# Patient Record
Sex: Female | Born: 1990 | Race: Black or African American | Hispanic: No | Marital: Married | Smoking: Never smoker
Health system: Southern US, Community
[De-identification: ages and names within clinical notes are randomized; demographics above are authoritative.]

## PROBLEM LIST (undated history)

## (undated) DIAGNOSIS — F32A Depression, unspecified: Secondary | ICD-10-CM

## (undated) DIAGNOSIS — E2740 Unspecified adrenocortical insufficiency: Secondary | ICD-10-CM

## (undated) DIAGNOSIS — G510 Bell's palsy: Secondary | ICD-10-CM

## (undated) DIAGNOSIS — J189 Pneumonia, unspecified organism: Secondary | ICD-10-CM

## (undated) DIAGNOSIS — I10 Essential (primary) hypertension: Secondary | ICD-10-CM

## (undated) DIAGNOSIS — R238 Other skin changes: Secondary | ICD-10-CM

## (undated) DIAGNOSIS — A154 Tuberculosis of intrathoracic lymph nodes: Secondary | ICD-10-CM

## (undated) DIAGNOSIS — N189 Chronic kidney disease, unspecified: Secondary | ICD-10-CM

## (undated) DIAGNOSIS — R569 Unspecified convulsions: Secondary | ICD-10-CM

## (undated) DIAGNOSIS — R102 Pelvic and perineal pain: Secondary | ICD-10-CM

## (undated) DIAGNOSIS — K210 Gastro-esophageal reflux disease with esophagitis: Secondary | ICD-10-CM

## (undated) DIAGNOSIS — B20 Human immunodeficiency virus [HIV] disease: Secondary | ICD-10-CM

## (undated) DIAGNOSIS — R42 Dizziness and giddiness: Secondary | ICD-10-CM

## (undated) DIAGNOSIS — A159 Respiratory tuberculosis unspecified: Secondary | ICD-10-CM

## (undated) DIAGNOSIS — R1010 Upper abdominal pain, unspecified: Secondary | ICD-10-CM

## (undated) DIAGNOSIS — R45851 Suicidal ideations: Secondary | ICD-10-CM

## (undated) DIAGNOSIS — D638 Anemia in other chronic diseases classified elsewhere: Secondary | ICD-10-CM

## (undated) DIAGNOSIS — F329 Major depressive disorder, single episode, unspecified: Secondary | ICD-10-CM

## (undated) DIAGNOSIS — R519 Headache, unspecified: Secondary | ICD-10-CM

## (undated) DIAGNOSIS — M5440 Lumbago with sciatica, unspecified side: Secondary | ICD-10-CM

## (undated) DIAGNOSIS — G939 Disorder of brain, unspecified: Secondary | ICD-10-CM

## (undated) DIAGNOSIS — K219 Gastro-esophageal reflux disease without esophagitis: Secondary | ICD-10-CM

## (undated) DIAGNOSIS — S91011A Laceration without foreign body, right ankle, initial encounter: Secondary | ICD-10-CM

## (undated) DIAGNOSIS — M79606 Pain in leg, unspecified: Secondary | ICD-10-CM

## (undated) DIAGNOSIS — R5383 Other fatigue: Secondary | ICD-10-CM

## (undated) DIAGNOSIS — G8929 Other chronic pain: Secondary | ICD-10-CM

## (undated) DIAGNOSIS — M5416 Radiculopathy, lumbar region: Secondary | ICD-10-CM

## (undated) DIAGNOSIS — M549 Dorsalgia, unspecified: Secondary | ICD-10-CM

## (undated) DIAGNOSIS — A872 Lymphocytic choriomeningitis: Secondary | ICD-10-CM

## (undated) DIAGNOSIS — B0089 Other herpesviral infection: Secondary | ICD-10-CM

## (undated) DIAGNOSIS — F322 Major depressive disorder, single episode, severe without psychotic features: Secondary | ICD-10-CM

## (undated) DIAGNOSIS — Z973 Presence of spectacles and contact lenses: Secondary | ICD-10-CM

## (undated) HISTORY — PX: APPENDECTOMY: SHX54

## (undated) HISTORY — DX: Pelvic and perineal pain: R10.2

## (undated) HISTORY — DX: Lumbago with sciatica, unspecified side: M54.40

---
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
## 2012-02-13 DIAGNOSIS — B20 Human immunodeficiency virus [HIV] disease: Secondary | ICD-10-CM

## 2012-02-13 DIAGNOSIS — Z21 Asymptomatic human immunodeficiency virus [HIV] infection status: Secondary | ICD-10-CM

## 2012-02-13 HISTORY — PX: LUNG BIOPSY: SHX232

## 2012-02-13 HISTORY — DX: Asymptomatic human immunodeficiency virus (hiv) infection status: Z21

## 2012-02-13 HISTORY — DX: Human immunodeficiency virus (HIV) disease: B20

## 2012-03-01 ENCOUNTER — Other Ambulatory Visit: Payer: Self-pay | Admitting: Geriatric Medicine

## 2012-03-01 ENCOUNTER — Ambulatory Visit
Admission: RE | Admit: 2012-03-01 | Discharge: 2012-03-01 | Disposition: A | Discharge status: Home / Self Care | Payer: No Typology Code available for payment source | Source: Ambulatory Visit | Attending: Geriatric Medicine | Admitting: Geriatric Medicine

## 2012-03-01 DIAGNOSIS — R509 Fever, unspecified: Secondary | ICD-10-CM

## 2012-03-01 DIAGNOSIS — R0600 Dyspnea, unspecified: Secondary | ICD-10-CM

## 2012-03-10 ENCOUNTER — Inpatient Hospital Stay (HOSPITAL_COMMUNITY)
Admission: EM | Admit: 2012-03-10 | Discharge: 2012-03-25 | DRG: 970 | Disposition: A | Discharge status: Home / Self Care | Payer: Medicaid Other | Source: Ambulatory Visit | Attending: Internal Medicine | Admitting: Internal Medicine

## 2012-03-10 ENCOUNTER — Encounter (HOSPITAL_COMMUNITY): Payer: Self-pay | Admitting: *Deleted

## 2012-03-10 ENCOUNTER — Other Ambulatory Visit: Payer: Self-pay

## 2012-03-10 DIAGNOSIS — Z418 Encounter for other procedures for purposes other than remedying health state: Secondary | ICD-10-CM

## 2012-03-10 DIAGNOSIS — Z2989 Encounter for other specified prophylactic measures: Secondary | ICD-10-CM

## 2012-03-10 DIAGNOSIS — B0089 Other herpesviral infection: Secondary | ICD-10-CM

## 2012-03-10 DIAGNOSIS — Z79899 Other long term (current) drug therapy: Secondary | ICD-10-CM

## 2012-03-10 DIAGNOSIS — D509 Iron deficiency anemia, unspecified: Secondary | ICD-10-CM | POA: Diagnosis present

## 2012-03-10 DIAGNOSIS — R0789 Other chest pain: Secondary | ICD-10-CM | POA: Diagnosis present

## 2012-03-10 DIAGNOSIS — B589 Toxoplasmosis, unspecified: Secondary | ICD-10-CM | POA: Diagnosis present

## 2012-03-10 DIAGNOSIS — D649 Anemia, unspecified: Secondary | ICD-10-CM

## 2012-03-10 DIAGNOSIS — R000 Tachycardia, unspecified: Secondary | ICD-10-CM | POA: Diagnosis not present

## 2012-03-10 DIAGNOSIS — B009 Herpesviral infection, unspecified: Secondary | ICD-10-CM | POA: Diagnosis present

## 2012-03-10 DIAGNOSIS — R079 Chest pain, unspecified: Secondary | ICD-10-CM | POA: Diagnosis present

## 2012-03-10 DIAGNOSIS — R918 Other nonspecific abnormal finding of lung field: Secondary | ICD-10-CM

## 2012-03-10 DIAGNOSIS — B370 Candidal stomatitis: Secondary | ICD-10-CM | POA: Diagnosis present

## 2012-03-10 DIAGNOSIS — J9859 Other diseases of mediastinum, not elsewhere classified: Secondary | ICD-10-CM

## 2012-03-10 DIAGNOSIS — A158 Other respiratory tuberculosis: Secondary | ICD-10-CM | POA: Diagnosis present

## 2012-03-10 DIAGNOSIS — E876 Hypokalemia: Secondary | ICD-10-CM | POA: Diagnosis not present

## 2012-03-10 DIAGNOSIS — K2210 Ulcer of esophagus without bleeding: Secondary | ICD-10-CM

## 2012-03-10 DIAGNOSIS — K2100 Gastro-esophageal reflux disease with esophagitis, without bleeding: Secondary | ICD-10-CM

## 2012-03-10 DIAGNOSIS — B20 Human immunodeficiency virus [HIV] disease: Secondary | ICD-10-CM | POA: Diagnosis present

## 2012-03-10 DIAGNOSIS — K208 Other esophagitis without bleeding: Secondary | ICD-10-CM | POA: Diagnosis present

## 2012-03-10 DIAGNOSIS — D638 Anemia in other chronic diseases classified elsewhere: Secondary | ICD-10-CM | POA: Diagnosis present

## 2012-03-10 DIAGNOSIS — A154 Tuberculosis of intrathoracic lymph nodes: Secondary | ICD-10-CM | POA: Diagnosis present

## 2012-03-10 DIAGNOSIS — D709 Neutropenia, unspecified: Secondary | ICD-10-CM | POA: Diagnosis not present

## 2012-03-10 DIAGNOSIS — R1310 Dysphagia, unspecified: Secondary | ICD-10-CM | POA: Diagnosis present

## 2012-03-10 DIAGNOSIS — D696 Thrombocytopenia, unspecified: Secondary | ICD-10-CM | POA: Diagnosis not present

## 2012-03-10 MED ORDER — ONDANSETRON HCL 4 MG/2ML IJ SOLN
4.0000 mg | Freq: Once | INTRAMUSCULAR | Status: AC
  Administered 2012-03-11: 4 mg via INTRAVENOUS
  Filled 2012-03-10: qty 2

## 2012-03-10 MED ORDER — MORPHINE SULFATE 4 MG/ML IJ SOLN
4.0000 mg | Freq: Once | INTRAMUSCULAR | Status: AC
  Administered 2012-03-11: 4 mg via INTRAVENOUS
  Filled 2012-03-10: qty 1

## 2012-03-10 MED ORDER — SODIUM CHLORIDE 0.9 % IV SOLN
INTRAVENOUS | Status: DC
  Administered 2012-03-11 – 2012-03-15 (×9): via INTRAVENOUS

## 2012-03-10 MED ORDER — PANTOPRAZOLE SODIUM 40 MG IV SOLR
40.0000 mg | Freq: Once | INTRAVENOUS | Status: AC
  Administered 2012-03-11: 40 mg via INTRAVENOUS
  Filled 2012-03-10: qty 40

## 2012-03-10 NOTE — ED Notes (Signed)
Pt reports epigastric pain x3 weeks - pt reports pain w/ swallowing and has been experiencing decreased  Appetite d/t pain. Pt states pain is sharp and radiates to back - admits to nausea, denies vomiting or diarrhea or fever.

## 2012-03-10 NOTE — ED Notes (Addendum)
C/o epigastric chest and abd pain, hurts chest to swallow & take a deep breath, "can't eat", (denies: vd, fever or sob), subjectively hot when pain comes. Some nausea.  "Seen here recently for the same & given abx, not getting any better"

## 2012-03-10 NOTE — ED Notes (Signed)
EKG DONE BY EMT R BROWN 

## 2012-03-11 ENCOUNTER — Emergency Department (HOSPITAL_COMMUNITY): Payer: Medicaid Other

## 2012-03-11 ENCOUNTER — Encounter (HOSPITAL_COMMUNITY)
Admission: EM | Disposition: A | Discharge status: Home / Self Care | Payer: Self-pay | Source: Ambulatory Visit | Attending: Internal Medicine

## 2012-03-11 ENCOUNTER — Encounter (HOSPITAL_COMMUNITY): Payer: Self-pay | Admitting: Radiology

## 2012-03-11 DIAGNOSIS — A154 Tuberculosis of intrathoracic lymph nodes: Secondary | ICD-10-CM

## 2012-03-11 DIAGNOSIS — K2100 Gastro-esophageal reflux disease with esophagitis, without bleeding: Secondary | ICD-10-CM

## 2012-03-11 DIAGNOSIS — K208 Other esophagitis without bleeding: Secondary | ICD-10-CM

## 2012-03-11 DIAGNOSIS — D649 Anemia, unspecified: Secondary | ICD-10-CM

## 2012-03-11 DIAGNOSIS — K2210 Ulcer of esophagus without bleeding: Secondary | ICD-10-CM

## 2012-03-11 DIAGNOSIS — R222 Localized swelling, mass and lump, trunk: Secondary | ICD-10-CM

## 2012-03-11 DIAGNOSIS — R079 Chest pain, unspecified: Secondary | ICD-10-CM | POA: Diagnosis present

## 2012-03-11 DIAGNOSIS — R1310 Dysphagia, unspecified: Secondary | ICD-10-CM | POA: Diagnosis present

## 2012-03-11 DIAGNOSIS — D638 Anemia in other chronic diseases classified elsewhere: Secondary | ICD-10-CM

## 2012-03-11 DIAGNOSIS — B0089 Other herpesviral infection: Secondary | ICD-10-CM

## 2012-03-11 DIAGNOSIS — D509 Iron deficiency anemia, unspecified: Secondary | ICD-10-CM | POA: Diagnosis present

## 2012-03-11 HISTORY — DX: Other esophagitis without bleeding: K20.80

## 2012-03-11 HISTORY — DX: Gastro-esophageal reflux disease with esophagitis, without bleeding: K21.00

## 2012-03-11 HISTORY — DX: Anemia in other chronic diseases classified elsewhere: D63.8

## 2012-03-11 HISTORY — DX: Other herpesviral infection: B00.89

## 2012-03-11 HISTORY — PX: ESOPHAGOGASTRODUODENOSCOPY: SHX5428

## 2012-03-11 HISTORY — DX: Tuberculosis of intrathoracic lymph nodes: A15.4

## 2012-03-11 LAB — COMPREHENSIVE METABOLIC PANEL
ALT: 10 U/L (ref 0–35)
ALT: 11 U/L (ref 0–35)
AST: 12 U/L (ref 0–37)
AST: 15 U/L (ref 0–37)
Albumin: 3 g/dL — ABNORMAL LOW (ref 3.5–5.2)
Albumin: 3.2 g/dL — ABNORMAL LOW (ref 3.5–5.2)
Alkaline Phosphatase: 63 U/L (ref 39–117)
Alkaline Phosphatase: 65 U/L (ref 39–117)
BUN: 4 mg/dL — ABNORMAL LOW (ref 6–23)
BUN: <3 mg/dL — ABNORMAL LOW (ref 6–23)
CO2: 23 mEq/L (ref 19–32)
CO2: 24 mEq/L (ref 19–32)
Calcium: 8.9 mg/dL (ref 8.4–10.5)
Calcium: 9.2 mg/dL (ref 8.4–10.5)
Chloride: 100 mEq/L (ref 96–112)
Chloride: 105 mEq/L (ref 96–112)
Creatinine, Ser: 0.61 mg/dL (ref 0.50–1.10)
Creatinine, Ser: 0.62 mg/dL (ref 0.50–1.10)
GFR calc Af Amer: >90 mL/min (ref >90)
GFR calc Af Amer: >90 mL/min (ref >90)
GFR calc non Af Amer: >90 mL/min (ref >90)
GFR calc non Af Amer: >90 mL/min (ref >90)
Glucose, Bld: 86 mg/dL (ref 70–99)
Glucose, Bld: 87 mg/dL (ref 70–99)
Potassium: 3.6 mEq/L (ref 3.5–5.1)
Potassium: 3.8 mEq/L (ref 3.5–5.1)
Sodium: 133 mEq/L — ABNORMAL LOW (ref 135–145)
Sodium: 138 mEq/L (ref 135–145)
Total Bilirubin: 0.2 mg/dL — ABNORMAL LOW (ref 0.3–1.2)
Total Bilirubin: 0.2 mg/dL — ABNORMAL LOW (ref 0.3–1.2)
Total Protein: 7.4 g/dL (ref 6.0–8.3)
Total Protein: 7.9 g/dL (ref 6.0–8.3)

## 2012-03-11 LAB — URINALYSIS, ROUTINE W REFLEX MICROSCOPIC
Bilirubin Urine: NEGATIVE
Glucose, UA: NEGATIVE mg/dL
Hgb urine dipstick: NEGATIVE
Ketones, ur: NEGATIVE mg/dL
Leukocytes, UA: NEGATIVE
Nitrite: NEGATIVE
Protein, ur: NEGATIVE mg/dL
Specific Gravity, Urine: 1.012 (ref 1.005–1.030)
Urobilinogen, UA: 0.2 mg/dL (ref 0.0–1.0)
pH: 7 (ref 5.0–8.0)

## 2012-03-11 LAB — GLUCOSE, CAPILLARY
Glucose-Capillary: 78 mg/dL (ref 70–99)
Glucose-Capillary: 73 mg/dL (ref 70–99)

## 2012-03-11 LAB — IRON AND TIBC
Iron: 34 ug/dL — ABNORMAL LOW (ref 42–135)
Saturation Ratios: 14 % — ABNORMAL LOW (ref 20–55)
TIBC: 242 ug/dL — ABNORMAL LOW (ref 250–470)
UIBC: 208 ug/dL (ref 125–400)

## 2012-03-11 LAB — CARDIAC PANEL(CRET KIN+CKTOT+MB+TROPI)
CK, MB: 1 ng/mL (ref 0.3–4.0)
Relative Index: INVALID (ref 0.0–2.5)
Total CK: 60 U/L (ref 7–177)
Troponin I: <0.3 ng/mL (ref <0.30)

## 2012-03-11 LAB — CBC
HCT: 30.3 % — ABNORMAL LOW (ref 36.0–46.0)
HCT: 31.1 % — ABNORMAL LOW (ref 36.0–46.0)
Hemoglobin: 10.4 g/dL — ABNORMAL LOW (ref 12.0–15.0)
Hemoglobin: 10.5 g/dL — ABNORMAL LOW (ref 12.0–15.0)
MCH: 30 pg (ref 26.0–34.0)
MCH: 30.3 pg (ref 26.0–34.0)
MCHC: 33.8 g/dL (ref 30.0–36.0)
MCHC: 34.3 g/dL (ref 30.0–36.0)
MCV: 87.3 fL (ref 78.0–100.0)
MCV: 89.6 fL (ref 78.0–100.0)
Platelets: 223 10*3/uL (ref 150–400)
Platelets: 247 10*3/uL (ref 150–400)
RBC: 3.47 MIL/uL — ABNORMAL LOW (ref 3.87–5.11)
RBC: 3.47 MIL/uL — ABNORMAL LOW (ref 3.87–5.11)
RDW: 14.9 % (ref 11.5–15.5)
RDW: 15.3 % (ref 11.5–15.5)
WBC: 2.3 10*3/uL — ABNORMAL LOW (ref 4.0–10.5)
WBC: 3.5 10*3/uL — ABNORMAL LOW (ref 4.0–10.5)

## 2012-03-11 LAB — HIV ANTIBODY (ROUTINE TESTING W REFLEX)
HIV: REACTIVE — AB
HIV: REACTIVE — AB

## 2012-03-11 LAB — PROTIME-INR
INR: 1.12 (ref 0.00–1.49)
Prothrombin Time: 14.6 seconds (ref 11.6–15.2)

## 2012-03-11 LAB — DIFFERENTIAL
Basophils Absolute: 0 10*3/uL (ref 0.0–0.1)
Basophils Relative: 0 % (ref 0–1)
Eosinophils Absolute: 0.1 10*3/uL (ref 0.0–0.7)
Eosinophils Relative: 3 % (ref 0–5)
Lymphocytes Relative: 25 % (ref 12–46)
Lymphs Abs: 0.6 10*3/uL — ABNORMAL LOW (ref 0.7–4.0)
Monocytes Absolute: 0.5 10*3/uL (ref 0.1–1.0)
Monocytes Relative: 20 % — ABNORMAL HIGH (ref 3–12)
Neutro Abs: 1.2 10*3/uL — ABNORMAL LOW (ref 1.7–7.7)
Neutrophils Relative %: 52 % (ref 43–77)

## 2012-03-11 LAB — SEDIMENTATION RATE: Sed Rate: 74 mm/hr — ABNORMAL HIGH (ref 0–22)

## 2012-03-11 LAB — PREGNANCY, URINE: Preg Test, Ur: NEGATIVE

## 2012-03-11 LAB — RETICULOCYTES
RBC.: 3.6 MIL/uL — ABNORMAL LOW (ref 3.87–5.11)
Retic Count, Absolute: 25.2 10*3/uL (ref 19.0–186.0)
Retic Ct Pct: 0.7 % (ref 0.4–3.1)

## 2012-03-11 LAB — LIPASE, BLOOD
Lipase: 20 U/L (ref 11–59)
Lipase: 28 U/L (ref 11–59)

## 2012-03-11 LAB — LACTIC ACID, PLASMA: Lactic Acid, Venous: 0.6 mmol/L (ref 0.5–2.2)

## 2012-03-11 LAB — URIC ACID: Uric Acid, Serum: 2.8 mg/dL (ref 2.4–7.0)

## 2012-03-11 LAB — FOLATE: Folate: 16.2 ng/mL

## 2012-03-11 LAB — FERRITIN: Ferritin: 57 ng/mL (ref 10–291)

## 2012-03-11 LAB — D-DIMER, QUANTITATIVE (NOT AT ARMC): D-Dimer, Quant: 1.62 ug/mL-FEU — ABNORMAL HIGH (ref 0.00–0.48)

## 2012-03-11 LAB — VITAMIN B12: Vitamin B-12: 1885 pg/mL — ABNORMAL HIGH (ref 211–911)

## 2012-03-11 SURGERY — EGD (ESOPHAGOGASTRODUODENOSCOPY)
Anesthesia: Moderate Sedation

## 2012-03-11 MED ORDER — ACETAMINOPHEN 325 MG PO TABS
650.0000 mg | ORAL_TABLET | Freq: Four times a day (QID) | ORAL | Status: DC | PRN
  Administered 2012-03-12 – 2012-03-20 (×3): 650 mg via ORAL
  Filled 2012-03-11 (×3): qty 2

## 2012-03-11 MED ORDER — FENTANYL CITRATE 0.05 MG/ML IJ SOLN
INTRAMUSCULAR | Status: AC
Start: 2012-03-11 — End: 2012-03-11
  Filled 2012-03-11: qty 2

## 2012-03-11 MED ORDER — IOHEXOL 350 MG/ML SOLN
100.0000 mL | Freq: Once | INTRAVENOUS | Status: AC | PRN
  Administered 2012-03-11: 100 mL via INTRAVENOUS

## 2012-03-11 MED ORDER — LIDOCAINE VISCOUS 2 % MT SOLN
20.0000 mL | OROMUCOSAL | Status: DC | PRN
  Filled 2012-03-11: qty 20

## 2012-03-11 MED ORDER — ONDANSETRON HCL 4 MG/2ML IJ SOLN
4.0000 mg | Freq: Four times a day (QID) | INTRAMUSCULAR | Status: DC | PRN
  Filled 2012-03-11: qty 2

## 2012-03-11 MED ORDER — MORPHINE SULFATE 2 MG/ML IJ SOLN
1.0000 mg | INTRAMUSCULAR | Status: DC | PRN
  Administered 2012-03-11 – 2012-03-12 (×3): 1 mg via INTRAVENOUS
  Filled 2012-03-11 (×3): qty 1

## 2012-03-11 MED ORDER — MIDAZOLAM HCL 10 MG/2ML IJ SOLN
INTRAMUSCULAR | Status: AC
  Filled 2012-03-11: qty 2

## 2012-03-11 MED ORDER — MIDAZOLAM HCL 10 MG/2ML IJ SOLN
INTRAMUSCULAR | Status: DC | PRN
  Administered 2012-03-11 (×2): 2 mg via INTRAVENOUS
  Administered 2012-03-11 (×2): 1 mg via INTRAVENOUS

## 2012-03-11 MED ORDER — MAGIC MOUTHWASH W/LIDOCAINE
5.0000 mL | Freq: Four times a day (QID) | ORAL | Status: DC
  Administered 2012-03-11 – 2012-03-16 (×18): 5 mL via ORAL
  Filled 2012-03-11 (×28): qty 5

## 2012-03-11 MED ORDER — ONDANSETRON HCL 4 MG PO TABS: 4.0000 mg | ORAL_TABLET | Freq: Four times a day (QID) | ORAL | Status: DC | PRN

## 2012-03-11 MED ORDER — SODIUM CHLORIDE 0.9 % IJ SOLN
3.0000 mL | Freq: Two times a day (BID) | INTRAMUSCULAR | Status: DC
  Administered 2012-03-12: 09:00:00 via INTRAVENOUS
  Administered 2012-03-12 – 2012-03-14 (×5): 3 mL via INTRAVENOUS
  Administered 2012-03-15: 10 mL via INTRAVENOUS

## 2012-03-11 MED ORDER — BUTAMBEN-TETRACAINE-BENZOCAINE 2-2-14 % EX AERO
INHALATION_SPRAY | CUTANEOUS | Status: DC | PRN
  Administered 2012-03-11: 2 via TOPICAL

## 2012-03-11 MED ORDER — PANTOPRAZOLE SODIUM 40 MG IV SOLR: 40.0000 mg | INTRAVENOUS | Status: DC

## 2012-03-11 MED ORDER — SUCRALFATE 1 GM/10ML PO SUSP
1.0000 g | Freq: Three times a day (TID) | ORAL | Status: DC
  Administered 2012-03-11 – 2012-03-17 (×19): 1 g via ORAL
  Filled 2012-03-11 (×28): qty 10

## 2012-03-11 MED ORDER — PANTOPRAZOLE SODIUM 40 MG IV SOLR
40.0000 mg | Freq: Two times a day (BID) | INTRAVENOUS | Status: DC
  Administered 2012-03-11 – 2012-03-17 (×12): 40 mg via INTRAVENOUS
  Filled 2012-03-11 (×15): qty 40

## 2012-03-11 MED ORDER — ACETAMINOPHEN 650 MG RE SUPP: 650.0000 mg | Freq: Four times a day (QID) | RECTAL | Status: DC | PRN

## 2012-03-11 MED ORDER — ENSURE COMPLETE PO LIQD
237.0000 mL | Freq: Three times a day (TID) | ORAL | Status: DC
  Administered 2012-03-11 – 2012-03-15 (×12): 237 mL via ORAL

## 2012-03-11 MED ORDER — FENTANYL NICU IV SYRINGE 50 MCG/ML
INJECTION | INTRAMUSCULAR | Status: DC | PRN
  Administered 2012-03-11 (×3): 25 ug via INTRAVENOUS

## 2012-03-11 MED ORDER — MORPHINE SULFATE 4 MG/ML IJ SOLN
4.0000 mg | Freq: Once | INTRAMUSCULAR | Status: AC
  Administered 2012-03-11: 4 mg via INTRAVENOUS
  Filled 2012-03-11: qty 1

## 2012-03-11 NOTE — H&P (Signed)
Kelsey Rodriguez is an 21 y.o. female.   PCP - None. Chief Complaint: Chest pain. HPI: 21 year-old female with no significant past history who has been in the [place] for the last year and a half and is originally from [place] has come to the ER because of chest pain. Patient has been experiencing chest pain for the last 3 weeks which has been progressively worsening. The pain is retrosternal increased on eating or drinking and also on deep breathing. She is just so much pain that she has stopped eating and lost weight. Denies any fever chills cough or phlegm nausea or diarrhea abdominal pain. In the ER patient had a CT angiogram of the chest which at this time shows a mass which could be arising from mediastinum versus lung patient has been admitted for further management. Patient had originally gone to urgent care and the physician over there had started her on PPI, empiric antibiotics and also anti-parasite medications even after taking all of this there was no relief.  History reviewed. No pertinent past medical history.  Past Surgical History  Procedure Date  . Appendectomy     History reviewed. No pertinent family history. Social History:  reports that she has never smoked. She does not have any smokeless tobacco history on file. She reports that she does not drink alcohol or use illicit drugs.  Allergies: No Known Allergies  Medications Prior to Admission  Medication Dose Route Frequency Provider Last Rate Last Dose  . 0.9 %  sodium chloride infusion   Intravenous Continuous Sunnie Nielsen, MD 125 mL/hr at 03/11/12 0328    . iohexol (OMNIPAQUE) 350 MG/ML injection 100 mL  100 mL Intravenous Once PRN Medication Radiologist, MD   100 mL at 03/11/12 0142  . morphine 4 MG/ML injection 4 mg  4 mg Intravenous Once Sunnie Nielsen, MD   4 mg at 03/11/12 0030  . morphine 4 MG/ML injection 4 mg  4 mg Intravenous Once Sunnie Nielsen, MD   4 mg at 03/11/12 0313  . ondansetron (ZOFRAN) injection 4 mg  4 mg  Intravenous Once Sunnie Nielsen, MD   4 mg at 03/11/12 0030  . pantoprazole (PROTONIX) injection 40 mg  40 mg Intravenous Once Sunnie Nielsen, MD   40 mg at 03/11/12 0030   No current outpatient prescriptions on file as of 03/10/2012.    Results for orders placed during the hospital encounter of 03/10/12 (from the past 48 hour(s))  CBC     Status: Abnormal   Collection Time   03/11/12 12:05 AM      Component Value Range Comment   WBC 3.5 (*) 4.0 - 10.5 (K/uL)    RBC 3.47 (*) 3.87 - 5.11 (MIL/uL)    Hemoglobin 10.4 (*) 12.0 - 15.0 (g/dL)    HCT 16.1 (*) 09.6 - 46.0 (%)    MCV 87.3  78.0 - 100.0 (fL)    MCH 30.0  26.0 - 34.0 (pg)    MCHC 34.3  30.0 - 36.0 (g/dL)    RDW 04.5  40.9 - 81.1 (%)    Platelets 247  150 - 400 (K/uL)   COMPREHENSIVE METABOLIC PANEL     Status: Abnormal   Collection Time   03/11/12 12:05 AM      Component Value Range Comment   Sodium 133 (*) 135 - 145 (mEq/L)    Potassium 3.8  3.5 - 5.1 (mEq/L)    Chloride 100  96 - 112 (mEq/L)    CO2 23  19 - 32 (mEq/L)    Glucose, Bld 87  70 - 99 (mg/dL)    BUN 4 (*) 6 - 23 (mg/dL)    Creatinine, Ser 1.61  0.50 - 1.10 (mg/dL)    Calcium 9.2  8.4 - 10.5 (mg/dL)    Total Protein 7.9  6.0 - 8.3 (g/dL)    Albumin 3.2 (*) 3.5 - 5.2 (g/dL)    AST 15  0 - 37 (U/L)    ALT 11  0 - 35 (U/L)    Alkaline Phosphatase 63  39 - 117 (U/L)    Total Bilirubin 0.2 (*) 0.3 - 1.2 (mg/dL)    GFR calc non Af Amer >90  >90 (mL/min)    GFR calc Af Amer >90  >90 (mL/min)   LIPASE, BLOOD     Status: Normal   Collection Time   03/11/12 12:05 AM      Component Value Range Comment   Lipase 28  11 - 59 (U/L)   D-DIMER, QUANTITATIVE     Status: Abnormal   Collection Time   03/11/12 12:05 AM      Component Value Range Comment   D-Dimer, Quant 1.62 (*) 0.00 - 0.48 (ug/mL-FEU)   URINALYSIS, ROUTINE W REFLEX MICROSCOPIC     Status: Abnormal   Collection Time   03/11/12 12:07 AM      Component Value Range Comment   Color, Urine YELLOW  YELLOW      APPearance CLOUDY (*) CLEAR     Specific Gravity, Urine 1.012  1.005 - 1.030     pH 7.0  5.0 - 8.0     Glucose, UA NEGATIVE  NEGATIVE (mg/dL)    Hgb urine dipstick NEGATIVE  NEGATIVE     Bilirubin Urine NEGATIVE  NEGATIVE     Ketones, ur NEGATIVE  NEGATIVE (mg/dL)    Protein, ur NEGATIVE  NEGATIVE (mg/dL)    Urobilinogen, UA 0.2  0.0 - 1.0 (mg/dL)    Nitrite NEGATIVE  NEGATIVE     Leukocytes, UA NEGATIVE  NEGATIVE  MICROSCOPIC NOT DONE ON URINES WITH NEGATIVE PROTEIN, BLOOD, LEUKOCYTES, NITRITE, OR GLUCOSE <1000 mg/dL.  PREGNANCY, URINE     Status: Normal   Collection Time   03/11/12 12:07 AM      Component Value Range Comment   Preg Test, Ur NEGATIVE  NEGATIVE     Ct Angio Chest W/cm &/or Wo Cm  03/11/2012  *RADIOLOGY REPORT*  Clinical Data: Chest pain and back pain for 3 weeks; elevated D- dimer.  Nausea.  CT ANGIOGRAPHY CHEST  Technique:  Multidetector CT imaging of the chest using the standard protocol during bolus administration of intravenous contrast. Multiplanar reconstructed images including MIPs were obtained and reviewed to evaluate the vascular anatomy.  Contrast: OMNIPAQUE IOHEXOL 350 MG/ML IV SOLN  Comparison: Chest radiograph performed earlier today at 12:14 a.m.  Findings: There is a focal 3.1 x 2.7 x 2.7 cm mass noted abutting the aortic arch, at the periaortic region, with centrally decreased attenuation.  Whether this arises from the mediastinum or from the left upper lobe is difficult to characterize on this study; surrounding atelectasis is noted, and this displaces adjacent vasculature.  Given the patient's age and its location, this may reflect a thymoma, germ cell tumor or less likely lymphoma, given the lack of significant additional lymphadenopathy.  If this arises from the lung, it may reflect an atypical infection such as tuberculosis, though the lack of leukocytosis or additional lung disease suggests against infection.  Adjacent scattered smaller lymph nodes  remain grossly normal in size.  The visualized thymus is grossly unremarkable in appearance.  The lungs are otherwise essentially clear bilaterally.  No pulmonary nodules are identified.  No pleural effusion or pneumothorax is seen.  Trace pericardial fluid remains borderline normal in appearance. The great vessels are unremarkable in appearance.  No definite mediastinal lymphadenopathy is otherwise seen; visualized mediastinal nodes remain borderline normal in size.  Diffuse haziness within the mediastinum raises question for mild diffuse associated soft tissue edema; underlying infiltration of the mediastinum cannot be excluded, given the patient's esophageal symptoms.  The great vessels are grossly unremarkable in appearance.  The visualized portions of the thyroid gland are unremarkable.  No axillary lymphadenopathy is seen.  The visualized portions of the liver and the spleen are unremarkable in appearance.  The gallbladder is within normal limits.  The visualized portions of the pancreas, adrenal glands and both kidneys are normal.  No acute osseous abnormalities are identified.  IMPRESSION:  1.  Focal 3.1 x 2.7 x 2.7 cm mass abutting the aortic arch at the periaortic region, with centrally decreased attenuation.  It is difficult to determine whether this arises from the mediastinum or from the left upper lung lobe; surrounding atelectasis is noted, with displacement of adjacent vasculature.  Given the patient's age and its location, this may reflect a thymoma, germ cell tumor or less likely lymphoma.  If this arises from the lung, it could reflect an atypical infection such as tuberculosis, though the lack of leukocytosis or additional lung disease suggests against infection. 2.  Scattered mediastinal nodes are otherwise borderline normal in size. 3.  Diffuse haziness within the mediastinum raises question for mild diffuse associated edema; underlying infiltration of the mediastinum cannot be excluded, given  the patient's esophageal symptoms.  Original Report Authenticated By: Tonia Ghent, M.D.   Dg Chest Portable 1 View  03/11/2012  *RADIOLOGY REPORT*  Clinical Data: Chest and abdominal pain for 3 weeks.  PORTABLE CHEST - 1 VIEW  Comparison: Chest radiograph performed 03/01/2012  Findings: The lungs are well-aerated.  Mild persistent opacity overlying the aortic knob again could reflect mild pneumonia.  This is perhaps slightly improved from the prior study.  There is no evidence of pleural effusion or pneumothorax.  The cardiomediastinal silhouette is within normal limits.  No acute osseous abnormalities are seen.  IMPRESSION: Mild persistent opacity overlying the aortic knob, possibly reflecting pneumonia, as on the prior study.  This has persisted over the past 10 days; suggest clinical correlation for symptoms of pneumonia.  If the patient does not have symptoms for pneumonia, further imaging may be considered, as deemed clinically appropriate.  Original Report Authenticated By: Tonia Ghent, M.D.    Review of Systems  Constitutional: Negative.   HENT: Negative.   Eyes: Negative.   Respiratory: Negative.   Cardiovascular: Positive for chest pain.  Gastrointestinal: Negative.   Genitourinary: Negative.   Musculoskeletal: Negative.   Skin: Negative.   Neurological: Negative.   Endo/Heme/Allergies: Negative.   Psychiatric/Behavioral: Negative.     Blood pressure 110/75, pulse 89, temperature 98.6 F (37 C), temperature source Oral, resp. rate 19, last menstrual period 02/21/2012, SpO2 100.00%. Physical Exam  Constitutional: She is oriented to person, place, and time. She appears well-developed and well-nourished. No distress.  HENT:  Head: Normocephalic and atraumatic.  Eyes: Conjunctivae are normal. Pupils are equal, round, and reactive to light. Right eye exhibits no discharge. Left eye exhibits no discharge.  Neck: Normal range of  motion. Neck supple.  Cardiovascular: Normal rate and  regular rhythm.   Respiratory: Effort normal and breath sounds normal. No respiratory distress. She has no wheezes. She has no rales.  GI: Soft. Bowel sounds are normal. She exhibits no distension. There is no tenderness. There is no rebound.  Musculoskeletal: Normal range of motion. She exhibits no edema and no tenderness.  Neurological: She is alert and oriented to person, place, and time.       Moves all limbs.  Skin: Skin is warm and dry. She is not diaphoretic.  Psychiatric: Her behavior is normal.     Assessment/Plan #1. Chest pain with dysphagia - patient's chest pain does not appear to be cardiac. It has a pleuritic in the dysphagia component. We will keep patient n.p.o. for now. I have consulted pulmonary to get their opinion. We need gastroenterology consult. Not sure if patient has mediastinitis. For now and repeating labs including lactic acid, sedimentation rate, metabolic panel, lipase, CBC with differential and cardiac enzymes. Gently hydrate patient. Placed patient on PPI and pain relief medications. Further recommendations as clinical course evolves based on consults recommendations.  CODE STATUS - full code.  Gwenevere Goga N. 03/11/2012, 4:40 AM

## 2012-03-11 NOTE — Consult Note (Signed)
Name: Kelsey Rodriguez MRN: [id number] DOB: 1991/09/01  LOS: 1  LB PULMONARY CONSULT NOTE  History of Present Illness: This is a pleasant 21 y/o female with no PMH who immigrated from [place] two years ago to the [place] who was admitted on 03/11/12 to [facility] with trouble swallowing and weight loss.  She states that she has had trouble swallowing for three weeks and notes pain on swallowing solids, liquids, and even her own saliva.  She has had fever to 102 at home.  She has lost 3-4 lbs in the last week because of poor po intake.  No cough.  She has diffuse pain in the right chest more than left.  She was found to have a mass in her lung vs. Mediastinum on a CT chest so pulmonary is consulted for further assessment.  Lines / Drains:   Cultures / Sepsis markers:    Antibiotics:   Tests / Events: 3/28 CT Angio chest >> Focal mass abutting th aortic at the peri-aortic region; 3.1x2.7x2.7 cm; difficult to say if the mass is mediastinal or a lung mass     History reviewed. No pertinent past medical history. Past Surgical History  Procedure Date  . Appendectomy    Prior to Admission medications   Medication Sig Start Date End Date Taking? Authorizing Provider  albendazole (ALBENZA) 200 MG tablet Take 400 mg by mouth once a week. On tuesday   Yes Historical Provider, MD  amoxicillin (AMOXIL) 500 MG tablet Take 500 mg by mouth 2 (two) times daily. For twenty days   Yes Historical Provider, MD  ciprofloxacin (CIPRO) 500 MG tablet Take 500 mg by mouth 2 (two) times daily. For 20 days   Yes Historical Provider, MD  diazepam (VALIUM) 10 MG tablet Take 10 mg by mouth every 6 (six) hours as needed. For anxiety   Yes Historical Provider, MD  omeprazole (PRILOSEC) 20 MG capsule Take 20 mg by mouth daily.   Yes Historical Provider, MD   No Known Allergies History reviewed. No pertinent family history. Social History  reports that she has never smoked. She does not have any smokeless tobacco history on file.  She reports that she does not drink alcohol or use illicit drugs.  Review Of Systems   Gen: Notes some fever, chills, and weight loss, and, fatigue,  HEENT: Denies blurred vision, double vision, hearing loss, tinnitus, sinus congestion, rhinorrhea, sore throat, neck stiffness, notes some dysphagia and odynaphagia PULM: Denies shortness of breath, cough, sputum production, hemoptysis, wheezing CV: Denies chest pain, edema, orthopnea, paroxysmal nocturnal dyspnea, palpitations GI: Denies abdominal pain, nausea, vomiting, diarrhea, hematochezia, melena, constipation, change in bowel habits GU: Denies dysuria, hematuria, polyuria, oliguria, urethral discharge Endocrine: Denies hot or cold intolerance, polyuria, polyphagia or appetite change Derm: Denies rash, dry skin, scaling or peeling skin change Heme: Denies easy bruising, bleeding, bleeding gums Neuro: Denies headache, numbness, weakness, slurred speech, loss of memory or consciousness  Vital Signs:   Filed Vitals:   03/11/12 0045 03/11/12 0148 03/11/12 0200 03/11/12 0456  BP: 110/73 123/77 110/75 116/75  Pulse: 89 90 89 90  Temp:    98.3 F (36.8 C)  TempSrc:    Oral  Resp: 15 15 19 20   Height:    5\' 5"  (1.651 m)  Weight:    57.607 kg (127 lb)  SpO2: 98% 100% 100% 100%  O2: RA  Physical Examination: Gen: no acute distress HEENT: NCAT, PERRL, EOMi, OP clear,  Neck: supple without masses Lymph: no palpable lymphadenopathy  in submandibular, ant/poster cervical, supraclavicular, or axillary areas bilaterally PULM: CTA B CV: RRR, no mgr, no JVD AB: BS+, soft, nontender, no hsm Ext: warm, no edema, no clubbing, no cyanosis Derm: no rash or skin breakdown Neuro: A&Ox4, CN II-XII intact, strength 5/5 in all 4 extremities Psyche: Normal mood and affect  Labs and Imaging:  CBC    Component Value Date/Time   WBC 3.5* 03/11/2012 0005   RBC 3.47* 03/11/2012 0005   HGB 10.4* 03/11/2012 0005   HCT 30.3* 03/11/2012 0005   PLT 247  03/11/2012 0005   MCV 87.3 03/11/2012 0005   MCH 30.0 03/11/2012 0005   MCHC 34.3 03/11/2012 0005   RDW 14.9 03/11/2012 0005    BMET    Component Value Date/Time   NA 133* 03/11/2012 0005   K 3.8 03/11/2012 0005   CL 100 03/11/2012 0005   CO2 23 03/11/2012 0005   GLUCOSE 87 03/11/2012 0005   BUN 4* 03/11/2012 0005   CREATININE 0.62 03/11/2012 0005   CALCIUM 9.2 03/11/2012 0005   GFRNONAA >90 03/11/2012 0005   GFRAA >90 03/11/2012 0005    U/A negative  Assessment and Plan:  This is a 21 y/o female with no past medical history who presents with dysphagia, odynophagia and a left upper lobe vs. mediastinal mass.  The mass does not appear to compress the esophagus nor is it in the thoracic inlet so it is unclear to me whether or not the mass is related to the dysphagia symptoms.   The location of the mass would make it difficult to approach by bronchoscopy without radial EBUS or navigational guideance as it compresses segments of the anterior bronchus of the left upper lobe, but does not appear to communicate with the airways.  Radiology has mentioned the typical differential diagnosis of anterior mediastinal masses (Thymoma, Teratoma/germ cell tumor, Thyroid, and Lymphoma), but it is not clear to me that this arises from the anterior mediastinum. Other less likely considerations would include a primary lung cancer with a paraneoplastic syndrome vs. an atypical or fungal infection.  She will ultimately need a biopsy for diagnosis.  She has no palpable lymphadenopathy on my exam.  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  I believe that we may ultimately recommend a thoracic surgery consultation  Anemia (03/11/2012)   Assessment: normocytic, unclear  if related; some thymoma masses can be related to aplastic anemia   Plan:  -reticulocyte count ordered, the remainder of the work up per primary team  Thanks for asking [place] to consult on this interesting patient.  Heber Campbell, M.D. Pulmonary and Critical Care Medicine [facility] Pager: [phone]  03/11/2012, 6:29 AM

## 2012-03-11 NOTE — Progress Notes (Addendum)
INITIAL ADULT NUTRITION ASSESSMENT Date: 03/11/2012   Time: 11:40 AM Reason for Assessment: Nutrition Risk, unt weight loss and dysphagia  ASSESSMENT: Female 21 y.o.  Dx: mass on lung vs mediastinum   Hx: History reviewed. No pertinent past medical history.  Related Meds:     .  morphine injection  4 mg Intravenous Once  .  morphine injection  4 mg Intravenous Once  . ondansetron  4 mg Intravenous Once  . pantoprazole (PROTONIX) IV  40 mg Intravenous Once  . pantoprazole (PROTONIX) IV  40 mg Intravenous Q12H  . sodium chloride  3 mL Intravenous Q12H     Ht: 5\' 5"  (165.1 cm)  Wt: 127 lb (57.607 kg)  Ideal Wt: 56.8 kg % Ideal Wt: 101%  Usual Wt: 130 lbs ( 59.1 kg) % Usual Wt: 97.5%  Body mass index is 21.13 kg/(m^2). WNL  Food/Nutrition Related Hx: Pain with swallowing for 3 weeks causing limited intake and weight loss. Difficulty with all foods and liquids.   Labs:  CMP     Component Value Date/Time   NA 138 03/11/2012 0618   K 3.6 03/11/2012 0618   CL 105 03/11/2012 0618   CO2 24 03/11/2012 0618   GLUCOSE 86 03/11/2012 0618   BUN <3* 03/11/2012 0618   CREATININE 0.61 03/11/2012 0618   CALCIUM 8.9 03/11/2012 0618   PROT 7.4 03/11/2012 0618   ALBUMIN 3.0* 03/11/2012 0618   AST 12 03/11/2012 0618   ALT 10 03/11/2012 0618   ALKPHOS 65 03/11/2012 0618   BILITOT 0.2* 03/11/2012 0618   GFRNONAA >90 03/11/2012 0618   GFRAA >90 03/11/2012 0618   Reports having a fever at home, now temp is WNL   Intake/Output Summary (Last 24 hours) at 03/11/12 1145 Last data filed at 03/11/12 0900  Gross per 24 hour  Intake  562.5 ml  Output      0 ml  Net  562.5 ml     Diet Order: NPO  Supplements/Tube Feeding: none  IVF:    sodium chloride Last Rate: 125 mL/hr at 03/11/12 0328    Estimated Nutritional Needs:   Kcal: 1700-1900 Protein: 60-70 gm Fluid:  1.7 - 1.9 L  Pt came in c/o pain and difficulty with swallowing and weight loss. Pt was found to have a mass either in her  lung or mediastinum. Per pulmonology notes unclear currently if this mass is causing her dysphagia. Will need a bx to determine dx. Recommends a swallow eval.  Pt with 3 lb weight loss in the last week, 2.3% severe weight loss. Pt unable to quantify amount eaten in the last week, but states that it was less then normal and had to force herself to eat. Pt likely has some degree of malnutrition based on weight loss and decreased intake.  Appetite is normal, pt states that she is very hungry and wants to eat. Currently NPO.   NUTRITION DIAGNOSIS: -Inadequate oral intake (NI-2.1).  Status: Ongoing  RELATED TO: pain/difficulty with swallowing  AS EVIDENCE BY: decreased intake and weight loss  MONITORING/EVALUATION(Goals): Goal: Pt will consume adequate PO intake to meet needs once diet is advanced. Monitor: diet advance, weights, labs, bx/dx   EDUCATION NEEDS: -No education needs identified at this time  INTERVENTION: 1. Once diet is advanced, RD will add Magic Cups with meals TID to help maintain weight 2. Recommend obtaining a swallow eval if felt that mass is not causing swallowing difficulty 3. RD will continue to monitor  Dietitian [phone]  DOCUMENTATION CODES Per approved criteria  -Not Applicable    Clarene Duke MARIE 03/11/2012, 11:40 AM

## 2012-03-11 NOTE — Progress Notes (Signed)
Utilization review complete 

## 2012-03-11 NOTE — Consult Note (Signed)
I have reviewed the above note, examined the patient and agree with plan of treatment. Odynophagia c/w esophagitis/ ulcerations, r/o immunosuppressed state. In her age group Herpes or CMV esophagitis is a consideration. Abnormal CT scan raises a question of an infiltrating mediastinal tumor, HIV status pending. Will proceed with EGD/biopsy.

## 2012-03-11 NOTE — Progress Notes (Signed)
Kelsey Rodriguez [id number] Code Status: FULL Admission Data: 03/11/2012 5:31 AM Attending Provider:  Rito Ehrlich PCP:No primary provider on file. Consults/ Treatment Team: Treatment Team:  Md Pccm, MD  Jhada Risk is a 21 y.o. female patient admitted from ED awake, alert - oriented  X 3 - no acute distress noted.  VSS - Blood pressure 116/75, pulse 90, temperature 98.3 F (36.8 C), temperature source Oral, resp. rate 20, height 5\' 5"  (1.651 m), weight 57.607 kg (127 lb), last menstrual period 02/21/2012, SpO2 100.00%.  no c/o shortness of breath, no c/o chest pain. Cardiac tele # [phone], in place, cardiac monitor yields:normal sinus rhythm.  IV Fluids:  IV in place, occlusive dsg intact without redness, IV cath forearm left, condition patent and no redness normal saline.  Allergies:  No Known Allergies   History reviewed. No pertinent past medical history. Medications Prior to Admission  Medication Dose Route Frequency Provider Last Rate Last Dose  . 0.9 %  sodium chloride infusion   Intravenous Continuous Sunnie Nielsen, MD 125 mL/hr at 03/11/12 0328    . acetaminophen (TYLENOL) tablet 650 mg  650 mg Oral Q6H PRN Eduard Clos, MD       Or  . acetaminophen (TYLENOL) suppository 650 mg  650 mg Rectal Q6H PRN Eduard Clos, MD      . iohexol (OMNIPAQUE) 350 MG/ML injection 100 mL  100 mL Intravenous Once PRN Medication Radiologist, MD   100 mL at 03/11/12 0142  . morphine 2 MG/ML injection 1 mg  1 mg Intravenous Q4H PRN Eduard Clos, MD      . morphine 4 MG/ML injection 4 mg  4 mg Intravenous Once Sunnie Nielsen, MD   4 mg at 03/11/12 0030  . morphine 4 MG/ML injection 4 mg  4 mg Intravenous Once Sunnie Nielsen, MD   4 mg at 03/11/12 0313  . ondansetron (ZOFRAN) injection 4 mg  4 mg Intravenous Once Sunnie Nielsen, MD   4 mg at 03/11/12 0030  . ondansetron (ZOFRAN) tablet 4 mg  4 mg Oral Q6H PRN Eduard Clos, MD       Or  . ondansetron [facility]) injection 4 mg  4 mg Intravenous Q6H PRN  Eduard Clos, MD      . pantoprazole (PROTONIX) injection 40 mg  40 mg Intravenous Once Sunnie Nielsen, MD   40 mg at 03/11/12 0030  . pantoprazole (PROTONIX) injection 40 mg  40 mg Intravenous Q12H Eduard Clos, MD      . sodium chloride 0.9 % injection 3 mL  3 mL Intravenous Q12H Eduard Clos, MD       No current outpatient prescriptions on file as of 03/11/2012.   History:  obtained from the patient. Tobacco/alcohol: denied none  Orientation to room, and floor completed with information packet given to patient/family.  Patient viewed safety video at this time.  Admission INP armband ID verified with patient/family, and in place.   SR up x 2, fall assessment complete, with patient and family able to verbalize understanding of risk associated with falls, and verbalized understanding to call nsg before up out of bed.  Call light within reach, patient able to voice, and demonstrate understanding.  Skin, clean-dry- intact without evidence of bruising, or skin tears.   No evidence of skin break down noted on exam.     Will cont to eval and treat per MD orders.  Orvan Seen, RN 03/11/2012 5:31 AM

## 2012-03-11 NOTE — ED Provider Notes (Signed)
History     CSN: [id number]  Arrival date & time 03/10/12  2117   First MD Initiated Contact with Patient 03/10/12 2339      Chief Complaint  Patient presents with  . Abdominal Pain  . Chest Pain    (Consider location/radiation/quality/duration/timing/severity/associated sxs/prior treatment) Patient is a 21 y.o. female presenting with chest pain. The history is provided by the patient and a relative.  Chest Pain Primary symptoms include abdominal pain. Pertinent negatives for primary symptoms include no fever, no shortness of breath and no vomiting.    persistent epigastric and substernal chest pain, worse with anything that she tries to eat or drink. This is been going on for last 3 weeks. She saw her primary physician Dr. Quintella Reichert and was prescribed Prilosec and antibiotics. Her symptoms have remained unchanged. She states that she has lost weight in this time frame is that she is unable to eat or drink anything. She is now developed food and fluid aversion due to her symptoms. Pain is sharp and burning in quality and not radiating. She denies any reflux or heartburn. No previous history of this otherwise. No known alleviating factors, has some discomfort at rest. No improvement with medications. Moderate in severity.  History reviewed. No pertinent past medical history.  Past Surgical History  Procedure Date  . Appendectomy     No family history on file.  History  Substance Use Topics  . Smoking status: Never Smoker   . Smokeless tobacco: Not on file  . Alcohol Use: No    OB History    Grav Para Term Preterm Abortions TAB SAB Ect Mult Living                  Review of Systems  Constitutional: Negative for fever and chills.  HENT: Negative for neck pain and neck stiffness.   Eyes: Negative for pain.  Respiratory: Positive for choking. Negative for shortness of breath.   Cardiovascular: Positive for chest pain.  Gastrointestinal: Positive for abdominal pain. Negative  for vomiting, diarrhea and constipation.  Genitourinary: Negative for dysuria.  Musculoskeletal: Negative for back pain.  Skin: Negative for rash.  Neurological: Negative for headaches.  All other systems reviewed and are negative.    Allergies  Review of patient's allergies indicates no known allergies.  Home Medications   Current Outpatient Rx  Name Route Sig Dispense Refill  . ALBENDAZOLE 200 MG PO TABS Oral Take 400 mg by mouth once a week. On tuesday    . AMOXICILLIN 500 MG PO TABS Oral Take 500 mg by mouth 2 (two) times daily. For twenty days    . CIPROFLOXACIN HCL 500 MG PO TABS Oral Take 500 mg by mouth 2 (two) times daily. For 20 days    . DIAZEPAM 10 MG PO TABS Oral Take 10 mg by mouth every 6 (six) hours as needed. For anxiety    . OMEPRAZOLE 20 MG PO CPDR Oral Take 20 mg by mouth daily.      BP 126/80  Pulse 95  Temp(Src) 98.6 F (37 C) (Oral)  Resp 21  SpO2 100%  LMP 02/21/2012  Physical Exam  Constitutional: She is oriented to person, place, and time. She appears well-developed and well-nourished.  HENT:  Head: Normocephalic and atraumatic.       Mildly dry mucous membranes  Eyes: Conjunctivae and EOM are normal. Pupils are equal, round, and reactive to light.  Neck: Trachea normal. Neck supple. No thyromegaly present.  Cardiovascular: Normal rate,  regular rhythm, S1 normal, S2 normal and normal pulses.     No systolic murmur is present   No diastolic murmur is present  Pulses:      Radial pulses are 2+ on the right side, and 2+ on the left side.  Pulmonary/Chest: Effort normal and breath sounds normal. She has no wheezes. She has no rhonchi. She has no rales. She exhibits no tenderness.  Abdominal: Soft. Normal appearance and bowel sounds are normal. There is no tenderness. There is no CVA tenderness and negative Murphy's sign.  Musculoskeletal:       BLE:s Calves nontender, no cords or erythema, negative Homans sign  Neurological: She is alert and  oriented to person, place, and time. She has normal strength. No cranial nerve deficit or sensory deficit. GCS eye subscore is 4. GCS verbal subscore is 5. GCS motor subscore is 6.  Skin: Skin is warm and dry. No rash noted. She is not diaphoretic.  Psychiatric: Her speech is normal.       Cooperative and appropriate    ED Course  Procedures (including critical care time)  Labs Reviewed  CBC - Abnormal; Notable for the following:    WBC 3.5 (*)    RBC 3.47 (*)    Hemoglobin 10.4 (*)    HCT 30.3 (*)    All other components within normal limits  COMPREHENSIVE METABOLIC PANEL - Abnormal; Notable for the following:    Sodium 133 (*)    BUN 4 (*)    Albumin 3.2 (*)    Total Bilirubin 0.2 (*)    All other components within normal limits  URINALYSIS, ROUTINE W REFLEX MICROSCOPIC - Abnormal; Notable for the following:    APPearance CLOUDY (*)    All other components within normal limits  D-DIMER, QUANTITATIVE - Abnormal; Notable for the following:    D-Dimer, Quant 1.62 (*)    All other components within normal limits  LIPASE, BLOOD  PREGNANCY, URINE   Dg Chest Portable 1 View  03/11/2012  *RADIOLOGY REPORT*  Clinical Data: Chest and abdominal pain for 3 weeks.  PORTABLE CHEST - 1 VIEW  Comparison: Chest radiograph performed 03/01/2012  Findings: The lungs are well-aerated.  Mild persistent opacity overlying the aortic knob again could reflect mild pneumonia.  This is perhaps slightly improved from the prior study.  There is no evidence of pleural effusion or pneumothorax.  The cardiomediastinal silhouette is within normal limits.  No acute osseous abnormalities are seen.  IMPRESSION: Mild persistent opacity overlying the aortic knob, possibly reflecting pneumonia, as on the prior study.  This has persisted over the past 10 days; suggest clinical correlation for symptoms of pneumonia.  If the patient does not have symptoms for pneumonia, further imaging may be considered, as deemed clinically  appropriate.  Original Report Authenticated By: Tonia Ghent, M.D.   Pain control and IV fluids. Chest x-ray obtained reviewed as above. Also has elevated d-dimer and CT angiography.   MDM   Chest pain. Mass on CT scan likely mediastinal given symptoms. Less likely pulmonary. No TB symptoms.  Medicine consult / admit for workup.      Sunnie Nielsen, MD 03/11/12 [phone]

## 2012-03-11 NOTE — Op Note (Signed)
Kelsey Rodriguez [facility] [address] [place], [place]  [address]  ENDOSCOPY PROCEDURE REPORT  PATIENT:  Kelsey, Rodriguez  MR#:  [id number] BIRTHDATE:  30-Nov-1991, 21 yrs. old  GENDER:  female  ENDOSCOPIST:  Hedwig Morton. Juanda Chance, MD Referred by:  Marga Melnick, M.D.  PROCEDURE DATE:  03/11/2012 PROCEDURE:  EGD with biopsy, [address] ASA CLASS:  Class II INDICATIONS:  odynophagia, dysphagia, weight loss, abnormal imaging ? mediastinal mass 66m CT scan, leukopenia,  MEDICATIONS:   These medications were titrated to patient response per physician's verbal order, Versed 6 mg, Fentanyl 75 mcg TOPICAL ANESTHETIC:  Cetacaine Spray  DESCRIPTION OF PROCEDURE:   After the risks benefits and alternatives of the procedure were thoroughly explained, informed consent was obtained.  The Pentax Gastroscope [id number] endoscope was introduced through the mouth and advanced to the second portion of the duodenum, without limitations.  The instrument was slowly withdrawn as the mucosa was fully examined. <<PROCEDUREIMAGES>>  Multiple ulcers were found in the total esophagus. multiple serpiginous ulcers throughout the esophagus, friable base, not obstructing, no mass or stricture, at least 6 ulcers 5-15 mm in diameter Multiple biopsies were obtained and sent to pathology (see image002, image006, image008, image010, image011, image012, and image013). viral cultures  Otherwise the examination was normal (see image005 and image003).    Retroflexed views revealed no abnormalities.    The scope was then withdrawn from the patient and the procedure completed.  COMPLICATIONS:  None  ENDOSCOPIC IMPRESSION: 1) Ulcers, multiple in the total esophagus 2) Otherwise normal examination extensice esophageal ulcerations suggestive of CMV or Herpes or an opportunistic infection, no evidence of obstructing mass or stricture, biopsies and viral culture obtained RECOMMENDATIONS: 1) Await biopsy results 2) Await  pathology results HIV status pending, r/o lumphoma, CMV, Herpes consider Panda placement for nutritional support if unable to swallow, Viscous Lidocaine prn,liquid diet with supplements  REPEAT EXAM:  In 0 year(s) for.  may have to be re-endoscoped if tissue not sufficient  ______________________________ Hedwig Morton. Juanda Chance, MD  CC:  n. eSIGNED:   Hedwig Morton. Aysia Lowder at 03/11/2012 04:10 PM  [name], [phone]

## 2012-03-11 NOTE — ED Notes (Signed)
Assumed care of pt.  No distress noted.  Pt resting, family at bedside.  Reports that she is hungry but understands that she cannot eat yet.

## 2012-03-11 NOTE — Interval H&P Note (Signed)
History and Physical Interval Note:  03/11/2012 3:39 PM  Kelsey Rodriguez  has presented today for surgery, with the diagnosis of odynnopahagia  The various methods of treatment have been discussed with the patient and family. After consideration of risks, benefits and other options for treatment, the patient has consented to  Procedure(s) (LRB): ESOPHAGOGASTRODUODENOSCOPY (EGD) (N/A) as a surgical intervention .  The patients' history has been reviewed, patient examined, no change in status, stable for surgery.  I have reviewed the patients' chart and labs.  Questions were answered to the patient's satisfaction.     Lina Sar

## 2012-03-11 NOTE — Consult Note (Signed)
[facility] Consult: 1:36 PM 03/11/2012   Referring Provider: Windell Norfolk  Primary Care Physician:  Feliciana Rossetti Primary Gastroenterologist:  unassigned  Reason for Consultation:  Odynophagia.   HPI: Kelsey Rodriguez is a 21 y.o. female.  Native of [place].  Admitted today, c/o chest pain progressing for 4 weeks. Occurs with deep breathng, eating and drinking.  Describes regurgitation, difficulty swallowing saliva. Too painful to eat so po intake limited and she has lost weight.  Started on Albendazole, amoxil, cipro, prilosec by Dr Quintella Reichert 3 weeks ago with no relief.  Chest CT with mediastinal vs. upper left lung mass abutting aortic arch with scattered adenopathy and hazzy appearance sugg of infiltration .   Hurts when she twists, bends chest area,  No cough or SOB.      History reviewed. No pertinent past medical history.  Past Surgical History  Procedure Date  . Appendectomy     Prior to Admission medications   Medication Sig Start Date End Date Taking? Authorizing Provider  albendazole (ALBENZA) 200 MG tablet Take 400 mg by mouth once a week. On tuesday   Yes Historical Provider, MD  amoxicillin (AMOXIL) 500 MG tablet Take 500 mg by mouth 2 (two) times daily. For twenty days   Yes Historical Provider, MD  ciprofloxacin (CIPRO) 500 MG tablet Take 500 mg by mouth 2 (two) times daily. For 20 days   Yes Historical Provider, MD  diazepam (VALIUM) 10 MG tablet Take 10 mg by mouth every 6 (six) hours as needed. For anxiety   Yes Historical Provider, MD  omeprazole (PRILOSEC) 20 MG capsule Take 20 mg by mouth daily.   Yes Historical Provider, MD    Scheduled Meds:    .  morphine injection  4 mg Intravenous Once  .  morphine injection  4 mg Intravenous Once  . ondansetron  4 mg Intravenous Once  . pantoprazole (PROTONIX) IV  40 mg Intravenous Once  . pantoprazole (PROTONIX) IV  40 mg Intravenous Q12H  . sodium chloride  3 mL Intravenous Q12H  .  DISCONTD: pantoprazole (PROTONIX) IV  40 mg Intravenous Q24H   Infusions:    . sodium chloride 125 mL/hr at 03/11/12 1321   PRN Meds: acetaminophen, acetaminophen, iohexol, morphine, ondansetron (ZOFRAN) IV, ondansetron   Allergies as of 03/10/2012  . (No Known Allergies)    family history. No cancers, anemia, gi disorders.  Mom has htn  History   Social History  . Marital Status: Single    Spouse Name: N/A    Number of Children: N/A  . Years of Education: N/A   Occupational History  . Not on file.   Social History Main Topics  . Smoking status: Never Smoker   . Smokeless tobacco: Not on file  . Alcohol Use: No  . Drug Use: No  . Sexually Active:    Other Topics Concern  . Not on file   Social History Narrative  . No narrative on file    REVIEW OF SYSTEMS: Constitutional:  3 # loss in  Last 7 days .  Baseline weight is 130-135 # ENT:  No nose bleeds or congestion Pulm:  No cough or dyspnea.  CV:  No palpitations or angina GU:  No hematuria, dysuria GYN:  Last period was 3/9.  No menorrhagia GI:  BMs 1 to 2 times weekly, down from daily due to lack of po intake Heme:  No hx of anemia, ss trait.    Transfusions:  none Neuro:  No headache  or seizure Derm:  No rash, sores, itching Endocrine:  No excessive thirst Immunization:  Flu shot last year Travel:  None in last 6 months.   PHYSICAL EXAM: Vital signs in last 24 hours: Temp:  [98.3 F (36.8 C)-99.2 F (37.3 C)] 98.3 F (36.8 C) (03/28 0456) Pulse Rate:  [87-102] 90  (03/28 0456) Resp:  [15-21] 20  (03/28 0456) BP: (110-126)/(73-84) 116/75 mmHg (03/28 0456) SpO2:  [98 %-100 %] 100 % (03/28 0456) Weight:  [127 lb (57.607 kg)] 127 lb (57.607 kg) (03/28 0456)  General: Pleasant, wee-appearing AAF Head:  No assymetry or trauma  Eyes:  No icterus or pallor Ears:  Not HOH  Nose:  No discharge or blood Mouth:  Moist MM, nonspecific white coating of tongue Neck:  No mass or tenderness Lungs:   Clear B.  No dyspnea or cough Heart: RRR, no MRG Abdomen:  Soft, tender at epigastrum and RUQ.   Rectal: deferred.    Musc/Skeltl: no joint swelling or deformity.  Extremities:  No edema  Neurologic:  No tremor, no gross deficits.  Not confused.  Good historian Skin:  No rash, no sores Tattoos:  None seen Nodes:  None at neck or groin   Psych:  Pleasant, not agitated or depressed  Intake/Output from previous day: 03/27 0701 - 03/28 0700 In: 562.5 [I.V.:562.5] Out: -  Intake/Output this shift:    LAB RESULTS:  Basename 03/11/12 0618 03/11/12 0005  WBC 2.3* 3.5*  HGB 10.5* 10.4*  HCT 31.1* 30.3*  PLT 223 247   BMET Lab Results  Component Value Date   NA 138 03/11/2012   NA 133* 03/11/2012   K 3.6 03/11/2012   K 3.8 03/11/2012   CL 105 03/11/2012   CL 100 03/11/2012   CO2 24 03/11/2012   CO2 23 03/11/2012   GLUCOSE 86 03/11/2012   GLUCOSE 87 03/11/2012   BUN <3* 03/11/2012   BUN 4* 03/11/2012   CREATININE 0.61 03/11/2012   CREATININE 0.62 03/11/2012   CALCIUM 8.9 03/11/2012   CALCIUM 9.2 03/11/2012   LFT  Basename 03/11/12 0618 03/11/12 0005  PROT 7.4 7.9  ALBUMIN 3.0* 3.2*  AST 12 15  ALT 10 11  ALKPHOS 65 63  BILITOT 0.2* 0.2*  BILIDIR -- --  IBILI -- --   PT/INR Lab Results  Component Value Date   INR 1.12 03/11/2012   Hepatitis Panel No results found for this basename: HEPBSAG,HCVAB,HEPAIGM,HEPBIGM in the last 72 hours C-Diff No components found with this basename: cdiff    Drugs of Abuse  No results found for this basename: labopia, cocainscrnur, labbenz, amphetmu, thcu, labbarb     RADIOLOGY STUDIES: Ct Angio Chest W/cm &/or Wo Cm  03/11/2012  *RADIOLOGY REPORT*  Clinical Data: Chest pain and back pain for 3 weeks; elevated D- dimer.  Nausea.  CT ANGIOGRAPHY CHEST  Technique:  Multidetector CT imaging of the chest using the standard protocol during bolus administration of intravenous contrast. Multiplanar reconstructed images including MIPs were obtained  and reviewed to evaluate the vascular anatomy.  Contrast: OMNIPAQUE IOHEXOL 350 MG/ML IV SOLN  Comparison: Chest radiograph performed earlier today at 12:14 a.m.  Findings: There is a focal 3.1 x 2.7 x 2.7 cm mass noted abutting the aortic arch, at the periaortic region, with centrally decreased attenuation.  Whether this arises from the mediastinum or from the left upper lobe is difficult to characterize on this study; surrounding atelectasis is noted, and this displaces adjacent vasculature.  Given the patient's  age and its location, this may reflect a thymoma, germ cell tumor or less likely lymphoma, given the lack of significant additional lymphadenopathy.  If this arises from the lung, it may reflect an atypical infection such as tuberculosis, though the lack of leukocytosis or additional lung disease suggests against infection.  Adjacent scattered smaller lymph nodes remain grossly normal in size.  The visualized thymus is grossly unremarkable in appearance.  The lungs are otherwise essentially clear bilaterally.  No pulmonary nodules are identified.  No pleural effusion or pneumothorax is seen.  Trace pericardial fluid remains borderline normal in appearance. The great vessels are unremarkable in appearance.  No definite mediastinal lymphadenopathy is otherwise seen; visualized mediastinal nodes remain borderline normal in size.  Diffuse haziness within the mediastinum raises question for mild diffuse associated soft tissue edema; underlying infiltration of the mediastinum cannot be excluded, given the patient's esophageal symptoms.  The great vessels are grossly unremarkable in appearance.  The visualized portions of the thyroid gland are unremarkable.  No axillary lymphadenopathy is seen.  The visualized portions of the liver and the spleen are unremarkable in appearance.  The gallbladder is within normal limits.  The visualized portions of the pancreas, adrenal glands and both kidneys are normal.  No  acute osseous abnormalities are identified.  IMPRESSION:  1.  Focal 3.1 x 2.7 x 2.7 cm mass abutting the aortic arch at the periaortic region, with centrally decreased attenuation.  It is difficult to determine whether this arises from the mediastinum or from the left upper lung lobe; surrounding atelectasis is noted, with displacement of adjacent vasculature.  Given the patient's age and its location, this may reflect a thymoma, germ cell tumor or less likely lymphoma.  If this arises from the lung, it could reflect an atypical infection such as tuberculosis, though the lack of leukocytosis or additional lung disease suggests against infection. 2.  Scattered mediastinal nodes are otherwise borderline normal in size. 3.  Diffuse haziness within the mediastinum raises question for mild diffuse associated edema; underlying infiltration of the mediastinum cannot be excluded, given the patient's esophageal symptoms.  Original Report Authenticated By: Tonia Ghent, M.D.   Dg Chest Portable 1 View  03/11/2012  *RADIOLOGY REPORT*  Clinical Data: Chest and abdominal pain for 3 weeks.  PORTABLE CHEST - 1 VIEW  Comparison: Chest radiograph performed 03/01/2012  Findings: The lungs are well-aerated.  Mild persistent opacity overlying the aortic knob again could reflect mild pneumonia.  This is perhaps slightly improved from the prior study.  There is no evidence of pleural effusion or pneumothorax.  The cardiomediastinal silhouette is within normal limits.  No acute osseous abnormalities are seen.  IMPRESSION: Mild persistent opacity overlying the aortic knob, possibly reflecting pneumonia, as on the prior study.  This has persisted over the past 10 days; suggest clinical correlation for symptoms of pneumonia.  If the patient does not have symptoms for pneumonia, further imaging may be considered, as deemed clinically appropriate.  Original Report Authenticated By: Tonia Ghent, M.D.    ENDOSCOPIC STUDIES: None  ever  IMPRESSION: 1.  Odynophagia.  R/o infectious esophagitis 2.  Mediastinal vs. Lung mass, normally this ought not cause this degree of pain.  Likely will require VATS biopsy as not amenable to bronchoscopic approach.  ? Burkett's Lymphoma, ? HIV with related immunocompromise related tumor? 3.  Normocytic anemia   PLAN: 1.  EGD today 2.  HIV testing in progress.    LOS: 1 day   Jennye Moccasin  03/11/2012,  1:36 PM Pager: [phone]

## 2012-03-11 NOTE — Progress Notes (Signed)
PATIENT DETAILS Name: Kelsey Rodriguez Age: 21 y.o. Sex: female Date of Birth: 1991/05/18 Admit Date: 03/10/2012 PCP:No primary provider on file. POA:   CONSULTS: Pulmonary  PROCEDURES:  Interim history:  No events overnight  Subjective: Pt reports significant pain even with swallowing saliva. States she is very hungry but cannot eat due to pain that has become much worse over the last week.  Objective: Vital signs in last 24 hours: Temp:  [98.3 F (36.8 C)-99.2 F (37.3 C)] 98.3 F (36.8 C) (03/28 0456) Pulse Rate:  [87-102] 90  (03/28 0456) Resp:  [15-21] 20  (03/28 0456) BP: (110-126)/(73-84) 116/75 mmHg (03/28 0456) SpO2:  [98 %-100 %] 100 % (03/28 0456) Weight:  [57.607 kg (127 lb)] 57.607 kg (127 lb) (03/28 0456) Weight change:  Last BM Date: 03/11/12  Intake/Output from previous day:  Intake/Output Summary (Last 24 hours) at 03/11/12 1312 Last data filed at 03/11/12 0900  Gross per 24 hour  Intake  562.5 ml  Output      0 ml  Net  562.5 ml     Physical Exam:  Gen:  Awake, alert in NAD HEENT: no evidence of thrush or oropharyngeal lesions. Cardiovascular:  S1S2 RRR, no m/r/g Respiratory: CTAB, no w/r/c, no increased wob Gastrointestinal: abdomen flat, soft, ND, BS+. Mild diffuse tenderness on palpation without rebound or guarding Extremities: no c/c/e   Lab Results:  Lab 03/11/12 0618 03/11/12 0005  HGB 10.5* 10.4*  HCT 31.1* 30.3*  WBC 2.3* 3.5*  PLT 223 247     Lab 03/11/12 0618 03/11/12 0005  NA 138 133*  K 3.6 3.8  CL 105 100  CO2 24 23  GLUCOSE 86 87  BUN <3* 4*  CREATININE 0.61 0.62  CALCIUM 8.9 9.2  MG -- --  PHOS -- --    Studies/Results: Ct Angio Chest W/cm &/or Wo Cm  03/11/2012  *RADIOLOGY REPORT*  Clinical Data: Chest pain and back pain for 3 weeks; elevated D- dimer.  Nausea.  CT ANGIOGRAPHY CHEST  Technique:  Multidetector CT imaging of the chest using the standard protocol during bolus administration of intravenous contrast.  Multiplanar reconstructed images including MIPs were obtained and reviewed to evaluate the vascular anatomy.  Contrast: OMNIPAQUE IOHEXOL 350 MG/ML IV SOLN  Comparison: Chest radiograph performed earlier today at 12:14 a.m.  Findings: There is a focal 3.1 x 2.7 x 2.7 cm mass noted abutting the aortic arch, at the periaortic region, with centrally decreased attenuation.  Whether this arises from the mediastinum or from the left upper lobe is difficult to characterize on this study; surrounding atelectasis is noted, and this displaces adjacent vasculature.  Given the patient's age and its location, this may reflect a thymoma, germ cell tumor or less likely lymphoma, given the lack of significant additional lymphadenopathy.  If this arises from the lung, it may reflect an atypical infection such as tuberculosis, though the lack of leukocytosis or additional lung disease suggests against infection.  Adjacent scattered smaller lymph nodes remain grossly normal in size.  The visualized thymus is grossly unremarkable in appearance.  The lungs are otherwise essentially clear bilaterally.  No pulmonary nodules are identified.  No pleural effusion or pneumothorax is seen.  Trace pericardial fluid remains borderline normal in appearance. The great vessels are unremarkable in appearance.  No definite mediastinal lymphadenopathy is otherwise seen; visualized mediastinal nodes remain borderline normal in size.  Diffuse haziness within the mediastinum raises question for mild diffuse associated soft tissue edema; underlying infiltration of  the mediastinum cannot be excluded, given the patient's esophageal symptoms.  The great vessels are grossly unremarkable in appearance.  The visualized portions of the thyroid gland are unremarkable.  No axillary lymphadenopathy is seen.  The visualized portions of the liver and the spleen are unremarkable in appearance.  The gallbladder is within normal limits.  The visualized portions of  the pancreas, adrenal glands and both kidneys are normal.  No acute osseous abnormalities are identified.  IMPRESSION:  1.  Focal 3.1 x 2.7 x 2.7 cm mass abutting the aortic arch at the periaortic region, with centrally decreased attenuation.  It is difficult to determine whether this arises from the mediastinum or from the left upper lung lobe; surrounding atelectasis is noted, with displacement of adjacent vasculature.  Given the patient's age and its location, this may reflect a thymoma, germ cell tumor or less likely lymphoma.  If this arises from the lung, it could reflect an atypical infection such as tuberculosis, though the lack of leukocytosis or additional lung disease suggests against infection. 2.  Scattered mediastinal nodes are otherwise borderline normal in size. 3.  Diffuse haziness within the mediastinum raises question for mild diffuse associated edema; underlying infiltration of the mediastinum cannot be excluded, given the patient's esophageal symptoms.  Original Report Authenticated By: Tonia Ghent, M.D.   Dg Chest Portable 1 View  03/11/2012  *RADIOLOGY REPORT*  Clinical Data: Chest and abdominal pain for 3 weeks.  PORTABLE CHEST - 1 VIEW  Comparison: Chest radiograph performed 03/01/2012  Findings: The lungs are well-aerated.  Mild persistent opacity overlying the aortic knob again could reflect mild pneumonia.  This is perhaps slightly improved from the prior study.  There is no evidence of pleural effusion or pneumothorax.  The cardiomediastinal silhouette is within normal limits.  No acute osseous abnormalities are seen.  IMPRESSION: Mild persistent opacity overlying the aortic knob, possibly reflecting pneumonia, as on the prior study.  This has persisted over the past 10 days; suggest clinical correlation for symptoms of pneumonia.  If the patient does not have symptoms for pneumonia, further imaging may be considered, as deemed clinically appropriate.  Original Report Authenticated  By: Tonia Ghent, M.D.    Medications: Scheduled Meds:   .  morphine injection  4 mg Intravenous Once  .  morphine injection  4 mg Intravenous Once  . ondansetron  4 mg Intravenous Once  . pantoprazole (PROTONIX) IV  40 mg Intravenous Once  . pantoprazole (PROTONIX) IV  40 mg Intravenous Q12H  . sodium chloride  3 mL Intravenous Q12H   Continuous Infusions:   . sodium chloride 125 mL/hr at 03/11/12 0328   PRN Meds:.acetaminophen, acetaminophen, iohexol, morphine, ondansetron (ZOFRAN) IV, ondansetron Antibiotics: Anti-infectives    None       Assessment/Plan: 1. Lung mass: Need tissue diagnosis as many differentials at this point. Will ask TCTS to consult per pulm recommendations.  2. Odynophagia: unclear etiology. CT reviewed with radiologist and no esophageal involvement. No evidence of thrush. Will start PPI.  GI asked to consult as pt may require endoscopy. She is unable to swallow barium for any study.   3. Anemia, mild normocytic: check anemia panel. Monitor  4. Neutropenia: suspect related to above. Will check HIV antibody.  Cordelia Pen, NP-C [facility]  pgr [phone]  Attending -I have seen and examined the patient, she has a anterior mediastinal mass. Discussed with Radiology-no mass effect on the esophagus-no radiologic explaination for the odynophagia/dysphagia. Will ask GI  to evaluate. Spoke with PCCM-no role for Bronchoscopy-have asked CTVS to evaluate as well.     LOS: 1 day    03/11/2012, 1:12 PM

## 2012-03-11 NOTE — Progress Notes (Signed)
Ct scans reviewed, this area not amenable to access by fob as is too medial as per Dr Ulyses Jarred consult  Rec Agree with T surgery evaluation, f/u pulmonary prn

## 2012-03-12 DIAGNOSIS — R072 Precordial pain: Secondary | ICD-10-CM

## 2012-03-12 DIAGNOSIS — B20 Human immunodeficiency virus [HIV] disease: Secondary | ICD-10-CM

## 2012-03-12 DIAGNOSIS — R222 Localized swelling, mass and lump, trunk: Secondary | ICD-10-CM

## 2012-03-12 LAB — URINALYSIS, ROUTINE W REFLEX MICROSCOPIC
Bilirubin Urine: NEGATIVE
Glucose, UA: NEGATIVE mg/dL
Hgb urine dipstick: NEGATIVE
Ketones, ur: NEGATIVE mg/dL
Leukocytes, UA: NEGATIVE
Nitrite: NEGATIVE
Protein, ur: NEGATIVE mg/dL
Specific Gravity, Urine: 1.008 (ref 1.005–1.030)
Urobilinogen, UA: 1 mg/dL (ref 0.0–1.0)
pH: 7 (ref 5.0–8.0)

## 2012-03-12 LAB — SURGICAL PCR SCREEN
MRSA, PCR: NEGATIVE
Staphylococcus aureus: NEGATIVE

## 2012-03-12 LAB — CBC
HCT: 30.9 % — ABNORMAL LOW (ref 36.0–46.0)
Hemoglobin: 10.3 g/dL — ABNORMAL LOW (ref 12.0–15.0)
MCH: 29.7 pg (ref 26.0–34.0)
MCHC: 33.3 g/dL (ref 30.0–36.0)
MCV: 89 fL (ref 78.0–100.0)
Platelets: 224 10*3/uL (ref 150–400)
RBC: 3.47 MIL/uL — ABNORMAL LOW (ref 3.87–5.11)
RDW: 15.1 % (ref 11.5–15.5)
WBC: 3.7 10*3/uL — ABNORMAL LOW (ref 4.0–10.5)

## 2012-03-12 LAB — DIFFERENTIAL
Basophils Absolute: 0 10*3/uL (ref 0.0–0.1)
Basophils Relative: 0 % (ref 0–1)
Eosinophils Absolute: 0.1 10*3/uL (ref 0.0–0.7)
Eosinophils Relative: 2 % (ref 0–5)
Lymphocytes Relative: 20 % (ref 12–46)
Lymphs Abs: 0.7 10*3/uL (ref 0.7–4.0)
Monocytes Absolute: 0.4 10*3/uL (ref 0.1–1.0)
Monocytes Relative: 11 % (ref 3–12)
Neutro Abs: 2.5 10*3/uL (ref 1.7–7.7)
Neutrophils Relative %: 67 % (ref 43–77)

## 2012-03-12 LAB — GLUCOSE, CAPILLARY
Glucose-Capillary: 48 mg/dL — ABNORMAL LOW (ref 70–99)
Glucose-Capillary: 79 mg/dL (ref 70–99)
Glucose-Capillary: 82 mg/dL (ref 70–99)
Glucose-Capillary: 78 mg/dL (ref 70–99)
Glucose-Capillary: 95 mg/dL (ref 70–99)

## 2012-03-12 LAB — T-HELPER CELLS (CD4) COUNT (NOT AT ARMC)
CD4 % Helper T Cell: 3 % — ABNORMAL LOW (ref 33–55)
CD4 T Cell Abs: 20 uL — ABNORMAL LOW (ref 400–2700)

## 2012-03-12 LAB — APTT: aPTT: 39 seconds — ABNORMAL HIGH (ref 24–37)

## 2012-03-12 MED ORDER — SODIUM CHLORIDE 0.9 % IV SOLN
5.0000 mg/kg | Freq: Two times a day (BID) | INTRAVENOUS | Status: DC
  Filled 2012-03-12 (×3): qty 290

## 2012-03-12 MED ORDER — ZOLPIDEM TARTRATE 5 MG PO TABS
5.0000 mg | ORAL_TABLET | Freq: Every evening | ORAL | Status: DC | PRN
  Administered 2012-03-12 – 2012-03-13 (×2): 5 mg via ORAL
  Filled 2012-03-12 (×2): qty 1

## 2012-03-12 MED ORDER — SODIUM CHLORIDE 0.9 % IV SOLN
5.0000 mg/kg | Freq: Two times a day (BID) | INTRAVENOUS | Status: DC
  Administered 2012-03-12 – 2012-03-17 (×10): 290 mg via INTRAVENOUS
  Filled 2012-03-12 (×19): qty 290

## 2012-03-12 NOTE — Progress Notes (Signed)
Notified Craige Cotta, NP that patient's HIV test came back reactive tonight. Called lab and lab stated that Western blot test had been sent out and that it would take about 2 days for results to come back.  Craige Cotta, NP put in order for differential in am. Will continue to monitor patient. Nelda Marseille, RN

## 2012-03-12 NOTE — Progress Notes (Signed)
Notified Craige Cotta, NP per text page that patient's temp is 101.9 this morning. Giving patient tynenol 650mg  po as ordered. Craige Cotta, NP put order in for blood culture's x2. Will continue to monitor patient.  Nelda Marseille, RN

## 2012-03-12 NOTE — Progress Notes (Signed)
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 This has the appearance of a lymphoma. If pathology from endoscopic biopsies of the esophagus are non-diagnostic then I would recommend a left Chamberlain procedure (left anterior mediastinotomy) and biopsy to obtain adequate tissue for lymphoma markers. Procedure discussed with patient. It would be tentatively plan for Tuesday or Wednesday next week. We'll follow results of esophageal biopsy and other serologies.

## 2012-03-12 NOTE — Progress Notes (Signed)
  Echocardiogram 2D Echocardiogram has been performed.  Kelsey Rodriguez, Real Cons 03/12/2012, 5:06 PM

## 2012-03-12 NOTE — Consult Note (Addendum)
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 She has been previously health until 3 weeks ago when she started having increasing dysphagia with epigastric pain and difficulty breathing. She was seen at [facility] on 3/18 where a chest Xray was suspicious for pneumonia. She was prescribed amoxil, cipro and albendazole with the plan to complete a two week regimen. However her symptoms failed to improve with the therapy and she presented again to [facility] on 3/28 with chest pain, worse with swallowing solids and liquids and shortness of breath. She denied fevers, chills, cough, sputum production, nausea, vomiting, diarrhea or abdominal pain, but admitted having lost weight recently due to difficulty eating. A CXR followed by a CT scan of the chest revealed a 3.1x2.7x2.7 cm mass abutting the aortic arch and compressing the surrounding lung parenchyma and esophagus. A preliminary HIV ELISA was positive and Western blot, CD4 count and HIV1 viral load were ordered. She had a negative HIV test four years ago in [place]. She has never had any tatoos or blood transfusions. She underwent an esophagoscopy that showed multiple ulcers throughout the esophagus and biopsies were taken. Currently the patient is stable and afebrile (T max 101.9 yesterday evening). Blood cultures are in process. Awaiting evaluation by thoracic surgeons for consideration for biopsy of the mass.   Review of Systems   Constitutional: Negative for fevers, night sweating, positive for weight loss HENT: Negative.  Eyes: Negative.  Respiratory: Positive for chest pain and shortness of breath, negative for cough, sputum production, hemoptysis  Cardiovascular: Positive for chest pian, negative for syncope, palpitations.  edema Gastrointestinal: Positive for epigastric discomfort, negative for abdominal pain, nausea, vomiting, diarrhea Genitourinary: Negative for burning in urination Musculoskeletal: Negative for weakness or muscle aches Skin: Negative for rashes or edema Neurological: Negative.  Psychiatric/Behavioral: Negative.   History reviewed. No pertinent past medical history. Past Surgical History  Procedure Date  . Appendectomy    History reviewed. No pertinent family history. History   Social History  . Marital Status: Single    Spouse Name: N/A    Number of Children: N/A  . Years of Education: N/A   Occupational History  . Not on file.   Social History Main Topics  . Smoking status: Never Smoker   . Smokeless tobacco: Not on file  . Alcohol Use: No  . Drug Use: No  . Sexually Active:    Other Topics Concern  . Not on file   Social History Narrative  . No narrative on file   No current facility-administered medications on file prior to encounter.   No current outpatient prescriptions on file prior to encounter.    No Known Allergies   Objective: Vital signs in last 24 hours: Temp:  [98.4 F (36.9 C)-101.9 F (38.8 C)] 98.4 F (36.9 C) (03/29 0824) Pulse Rate:  [89-104] 89  (03/29 0824) Resp:  [7-95] 13  (03/29 0824) BP: (103-135)/(64-88) 103/64 mmHg (03/29 0824) SpO2:  [97 %-100 %] 99 % (03/29 0824)  Intake/Output from previous day: 03/28 0701 - 03/29 0700 In: 1410.9 [I.V.:1410.9] Out: -  Intake/Output this shift: Total I/O In: 240 [P.O.:240] Out: -   Physical exam:   Const: alert cooperative,   HEENT: normocephalic, EOMI, PERRL,atraumatic, oropharynx without erythema or  exudates NECK: small right ant cervical lad CV:RRR, Normal s1 s2, no murmurs, rub or gallops LUNGS: CTA bilaterally ABD: soft, mild constipation, mild epigastric tenderness on deep palpation, normal BS, no hepatosplenomegaly SKIN: no rashes or edema NEURO: normal muscle strength,  sensation intact, CN 2/12 grossly intact  Lab Results  Basename 03/12/12 0610 03/11/12 0618 03/11/12 0005  WBC 3.7* 2.3* --  HGB 10.3* 10.5* --  HCT 30.9* 31.1* --  NA -- 138 133*  K -- 3.6 3.8  CL -- 105 100  CO2 -- 24 23  BUN -- <3* 4*  CREATININE -- 0.61 0.62  GLU -- -- --   Liver Panel  Basename 03/11/12 0618 03/11/12 0005  PROT 7.4 7.9  ALBUMIN 3.0* 3.2*  AST 12 15  ALT 10 11  ALKPHOS 65 63  BILITOT 0.2* 0.2*  BILIDIR -- --  IBILI -- --   Sedimentation Rate  Basename 03/11/12 0618  ESRSEDRATE 74*   C-Reactive Protein No results found for this basename: CRP:2 in the last 72 hours  Microbiology: No results found for this or any previous visit (from the past 240 hour(s)).  Studies/Results: Ct Angio Chest W/cm &/or Wo Cm  03/11/2012  *RADIOLOGY REPORT*  Clinical Data: Chest pain and back pain for 3 weeks; elevated D- dimer.  Nausea.  CT ANGIOGRAPHY CHEST  Technique:  Multidetector CT imaging of the chest using the standard protocol during bolus administration of intravenous contrast. Multiplanar reconstructed images including MIPs were obtained and reviewed to evaluate the vascular anatomy.  Contrast: OMNIPAQUE IOHEXOL 350 MG/ML IV SOLN  Comparison: Chest radiograph performed earlier today at 12:14 a.m.  Findings: There is a focal 3.1 x 2.7 x 2.7 cm mass noted abutting the aortic arch, at the periaortic region, with centrally decreased attenuation.  Whether this arises from the mediastinum or from the left upper lobe is difficult to characterize on this study; surrounding atelectasis is noted, and this displaces adjacent vasculature.  Given the patient's age and its location, this may reflect a thymoma, germ cell tumor or less likely lymphoma, given the lack of significant additional lymphadenopathy.  If this arises from the lung, it may reflect an atypical infection such as tuberculosis, though the lack of leukocytosis or additional lung disease suggests against  infection.  Adjacent scattered smaller lymph nodes remain grossly normal in size.  The visualized thymus is grossly unremarkable in appearance.  The lungs are otherwise essentially clear bilaterally.  No pulmonary nodules are identified.  No pleural effusion or pneumothorax is seen.  Trace pericardial fluid remains borderline normal in appearance. The great vessels are unremarkable in appearance.  No definite mediastinal lymphadenopathy is otherwise seen; visualized mediastinal nodes remain borderline normal in size.  Diffuse haziness within the mediastinum raises question for mild diffuse associated soft tissue edema; underlying infiltration of the mediastinum cannot be excluded, given the patient's esophageal symptoms.  The great vessels are grossly unremarkable in appearance.  The visualized portions of the thyroid gland are unremarkable.  No axillary lymphadenopathy is seen.  The visualized portions of the liver and the spleen are unremarkable in appearance.  The gallbladder is within normal limits.  The visualized portions of the pancreas, adrenal glands and both kidneys are normal.  No acute osseous abnormalities are identified.  IMPRESSION:  1.  Focal 3.1 x 2.7 x 2.7 cm mass abutting the aortic arch at the periaortic region, with centrally decreased attenuation.  It is difficult to determine whether this arises from the mediastinum or from  the left upper lung lobe; surrounding atelectasis is noted, with displacement of adjacent vasculature.  Given the patient's age and its location, this may reflect a thymoma, germ cell tumor or less likely lymphoma.  If this arises from the lung, it could reflect an atypical infection such as tuberculosis, though the lack of leukocytosis or additional lung disease suggests against infection. 2.  Scattered mediastinal nodes are otherwise borderline normal in size. 3.  Diffuse haziness within the mediastinum raises question for mild diffuse associated edema; underlying  infiltration of the mediastinum cannot be excluded, given the patient's esophageal symptoms.  Original Report Authenticated By: Tonia Ghent, M.D.   Dg Chest Portable 1 View  03/11/2012  *RADIOLOGY REPORT*  Clinical Data: Chest and abdominal pain for 3 weeks.  PORTABLE CHEST - 1 VIEW  Comparison: Chest radiograph performed 03/01/2012  Findings: The lungs are well-aerated.  Mild persistent opacity overlying the aortic knob again could reflect mild pneumonia.  This is perhaps slightly improved from the prior study.  There is no evidence of pleural effusion or pneumothorax.  The cardiomediastinal silhouette is within normal limits.  No acute osseous abnormalities are seen.  IMPRESSION: Mild persistent opacity overlying the aortic knob, possibly reflecting pneumonia, as on the prior study.  This has persisted over the past 10 days; suggest clinical correlation for symptoms of pneumonia.  If the patient does not have symptoms for pneumonia, further imaging may be considered, as deemed clinically appropriate.  Original Report Authenticated By: Tonia Ghent, M.D.    Medications:   Current facility-administered medications:0.9 %  sodium chloride infusion, , Intravenous, Continuous, Sunnie Nielsen, MD, Last Rate: 125 mL/hr at 03/12/12 1154;  acetaminophen (TYLENOL) suppository 650 mg, 650 mg, Rectal, Q6H PRN, Eduard Clos, MD;  acetaminophen (TYLENOL) tablet 650 mg, 650 mg, Oral, Q6H PRN, Eduard Clos, MD, 650 mg at 03/12/12 0459 feeding supplement (ENSURE COMPLETE) liquid 237 mL, 237 mL, Oral, TID WC, Dianah Field, PA, 237 mL at 03/12/12 1610;  ganciclovir (CYTOVENE) 290 mg in sodium chloride 0.9 % 100 mL IVPB, 5 mg/kg, Intravenous, Q12H, Marianne L York, PA;  lidocaine (XYLOCAINE) 2 % viscous mouth solution 20 mL, 20 mL, Mouth/Throat, Q3H PRN, Dianah Field, PA;  magic mouthwash w/lidocaine, 5 mL, Oral, QID, Maretta Bees, MD, 5 mL at 03/12/12 0906 morphine 2 MG/ML injection 1 mg, 1 mg,  Intravenous, Q4H PRN, Eduard Clos, MD, 1 mg at 03/12/12 0459;  ondansetron (ZOFRAN) injection 4 mg, 4 mg, Intravenous, Q6H PRN, Eduard Clos, MD;  ondansetron (ZOFRAN) tablet 4 mg, 4 mg, Oral, Q6H PRN, Eduard Clos, MD;  pantoprazole (PROTONIX) injection 40 mg, 40 mg, Intravenous, Q12H, Eduard Clos, MD, 40 mg at 03/12/12 0924 sodium chloride 0.9 % injection 3 mL, 3 mL, Intravenous, Q12H, Eduard Clos, MD;  sucralfate (CARAFATE) 1 GM/10ML suspension 1 g, 1 g, Oral, TID WC & HS, Maretta Bees, MD, 1 g at 03/12/12 9604;  DISCONTD: butamben-tetracaine-benzocaine (CETACAINE) spray, , , PRN, Hart Carwin, MD, 2 spray at 03/11/12 1541;  DISCONTD: fentaNYL NICU IV Syringe 50 mcg/mL, , , PRN, Hart Carwin, MD, 25 mcg at 03/11/12 1550 DISCONTD: midazolam (VERSED) injection, , , PRN, Hart Carwin, MD, 1 mg at 03/11/12 1550;  DISCONTD: pantoprazole (PROTONIX) injection 40 mg, 40 mg, Intravenous, Q24H, Cordelia Pen, NP  Assessment/Plan:  Assessment: This is a patient with a mediastinal/pulmonary mass and a recent HIV positive ELISA essay. Will need to confirm she is HIV positive  with Western blot, awaiting CD4 count and viral load. Differential for the mediastinal mass includes lymphoma, teratoma, germ cell tumor or thymoma although given her HIV positive test possibility for lymphoma is high.  Her esophageal lesions on endoscopy are concerning for HSV, CMV or other oppurtunistic infection. Also with fever.  Plan: Awaiting Western blot and CD4/viral load before we can discuss different treatment options with the patient. Also will need to determine what the mass is.  Will follow patient's HSV and blood cultures.  Start her on IV ganciclovir empirically to treat both CMV and HSV since she is quite symptomatic.  -fever could be from underlying cancer, HIV or CMV/HSV.  Will follow.    -mediastinal biopsy should be sent for bacteria gram stain and culture, AFB, fungal.      -will check appropriate screening labs for new diagnosis.     LOS: 2 days    Staci Righter, MD

## 2012-03-12 NOTE — Progress Notes (Signed)
ANTIBIOTIC CONSULT NOTE - INITIAL  Pharmacy Consult for Ganciclovir Indication: CMV  No Known Allergies  Patient Measurements: Height: 5\' 5"  (165.1 cm) Weight: 127 lb (57.607 kg) IBW/kg (Calculated) : 57  Adjusted Body Weight:    Vital Signs: Temp: 98.4 F (36.9 C) (03/29 0824) Temp src: Oral (03/29 0440) BP: 103/64 mmHg (03/29 0824) Pulse Rate: 89  (03/29 0824) Intake/Output from previous day: 03/28 0701 - 03/29 0700 In: 1410.9 [I.V.:1410.9] Out: -  Intake/Output from this shift: Total I/O In: 240 [P.O.:240] Out: -   Labs:  Basename 03/12/12 0610 03/11/12 0618 03/11/12 0005  WBC 3.7* 2.3* 3.5*  HGB 10.3* 10.5* 10.4*  PLT 224 223 247  LABCREA -- -- --  CREATININE -- 0.61 0.62   Estimated Creatinine Clearance: 98.4 ml/min (by C-G formula based on Cr of 0.61). No results found for this basename: VANCOTROUGH:2,VANCOPEAK:2,VANCORANDOM:2,GENTTROUGH:2,GENTPEAK:2,GENTRANDOM:2,TOBRATROUGH:2,TOBRAPEAK:2,TOBRARND:2,AMIKACINPEAK:2,AMIKACINTROU:2,AMIKACIN:2, in the last 72 hours   Microbiology: No results found for this or any previous visit (from the past 720 hour(s)).  Medical History: History reviewed. No pertinent past medical history.  Assessment: Admitted c/o CP. 21 y/o from [place] with new diagnosis 042. CT= mediastinal mass. Continues to have CP, dysphagia, GERD, and esophageal ulcers. Consulting TCTS for lung mass bx.  ID recommended starting ganciclovir to cover for CMV.  Plan:  Ganciclovir 5mg /12h IV MONITOR: CBC.  **Note: Drug has high incidence of causing fever, diarrhea, anorexia, vomiting, neukopenia, anemia, neutropenia, thrombocytopesn, and increased SCr (2-14%)**  Merilynn Finland, [employer] 03/12/2012,11:50 AM

## 2012-03-12 NOTE — Progress Notes (Signed)
Nurse tech took patient's CBG was 48, nurse tech stated that patient's fingers would not bleed much.  Rechecked CBG twice with adequate amount of blood need for CBG, readings were 82 and 79.   Will continue to monitor patient. Nelda Marseille, RN

## 2012-03-12 NOTE — Progress Notes (Signed)
Subjective Could not sleep last night due to chest pain, mother in the room  Objective:Large esophageal ulcerations, possibly infectious but cannot r/o neoplastic process such as lymphoma Vital signs in last 24 hours: Temp:  [98.5 F (36.9 C)-101.9 F (38.8 C)] 99.4 F (37.4 C) (03/29 0622) Pulse Rate:  [91-104] 104  (03/29 0440) Resp:  [7-95] 20  (03/29 0440) BP: (105-135)/(64-88) 117/75 mmHg (03/29 0440) SpO2:  [97 %-100 %] 100 % (03/29 0440) Last BM Date:  (pta) General:   Alert,  pleasant, cooperative in NAD Head:  Normocephalic and atraumatic. Eyes:  Sclera clear, no icterus.   Conjunctiva pink. Mouth:  No deformity or lesions, dentition normal. Neck:  Supple; no masses or thyromegaly. Heart:  Regular rate and rhythm; no murmurs, clicks, rubs,  or gallops. Lungs:  No wheezes or rales Abdomen:  *soft, nontender, active bowl sounds Msk:  Symmetrical without gross deformities. Normal posture. Pulses:  Normal pulses noted. Extremities:  Without clubbing or edema. Neurologic:  Alert and  oriented x4;  grossly normal neurologically. Skin:  Intact without significant lesions or rashes.  Intake/Output from previous day: 03/28 0701 - 03/29 0700 In: 1410.9 [I.V.:1410.9] Out: -  Intake/Output this shift:    Lab Results:  Basename 03/12/12 0610 03/11/12 0618 03/11/12 0005  WBC 3.7* 2.3* 3.5*  HGB 10.3* 10.5* 10.4*  HCT 30.9* 31.1* 30.3*  PLT 224 223 247   BMET  Basename 03/11/12 0618 03/11/12 0005  NA 138 133*  K 3.6 3.8  CL 105 100  CO2 24 23  GLUCOSE 86 87  BUN <3* 4*  CREATININE 0.61 0.62  CALCIUM 8.9 9.2   LFT  Basename 03/11/12 0618  PROT 7.4  ALBUMIN 3.0*  AST 12  ALT 10  ALKPHOS 65  BILITOT 0.2*  BILIDIR --  IBILI --   PT/INR  Basename 03/11/12 0618  LABPROT 14.6  INR 1.12   Hepatitis Panel No results found for this basename: HEPBSAG,HCVAB,HEPAIGM,HEPBIGM in the last 72 hours  Studies/Results: Ct Angio Chest W/cm &/or Wo Cm  03/11/2012   *RADIOLOGY REPORT*  Clinical Data: Chest pain and back pain for 3 weeks; elevated D- dimer.  Nausea.  CT ANGIOGRAPHY CHEST  Technique:  Multidetector CT imaging of the chest using the standard protocol during bolus administration of intravenous contrast. Multiplanar reconstructed images including MIPs were obtained and reviewed to evaluate the vascular anatomy.  Contrast: OMNIPAQUE IOHEXOL 350 MG/ML IV SOLN  Comparison: Chest radiograph performed earlier today at 12:14 a.m.  Findings: There is a focal 3.1 x 2.7 x 2.7 cm mass noted abutting the aortic arch, at the periaortic region, with centrally decreased attenuation.  Whether this arises from the mediastinum or from the left upper lobe is difficult to characterize on this study; surrounding atelectasis is noted, and this displaces adjacent vasculature.  Given the patient's age and its location, this may reflect a thymoma, germ cell tumor or less likely lymphoma, given the lack of significant additional lymphadenopathy.  If this arises from the lung, it may reflect an atypical infection such as tuberculosis, though the lack of leukocytosis or additional lung disease suggests against infection.  Adjacent scattered smaller lymph nodes remain grossly normal in size.  The visualized thymus is grossly unremarkable in appearance.  The lungs are otherwise essentially clear bilaterally.  No pulmonary nodules are identified.  No pleural effusion or pneumothorax is seen.  Trace pericardial fluid remains borderline normal in appearance. The great vessels are unremarkable in appearance.  No definite mediastinal lymphadenopathy  is otherwise seen; visualized mediastinal nodes remain borderline normal in size.  Diffuse haziness within the mediastinum raises question for mild diffuse associated soft tissue edema; underlying infiltration of the mediastinum cannot be excluded, given the patient's esophageal symptoms.  The great vessels are grossly unremarkable in appearance.   The visualized portions of the thyroid gland are unremarkable.  No axillary lymphadenopathy is seen.  The visualized portions of the liver and the spleen are unremarkable in appearance.  The gallbladder is within normal limits.  The visualized portions of the pancreas, adrenal glands and both kidneys are normal.  No acute osseous abnormalities are identified.  IMPRESSION:  1.  Focal 3.1 x 2.7 x 2.7 cm mass abutting the aortic arch at the periaortic region, with centrally decreased attenuation.  It is difficult to determine whether this arises from the mediastinum or from the left upper lung lobe; surrounding atelectasis is noted, with displacement of adjacent vasculature.  Given the patient's age and its location, this may reflect a thymoma, germ cell tumor or less likely lymphoma.  If this arises from the lung, it could reflect an atypical infection such as tuberculosis, though the lack of leukocytosis or additional lung disease suggests against infection. 2.  Scattered mediastinal nodes are otherwise borderline normal in size. 3.  Diffuse haziness within the mediastinum raises question for mild diffuse associated edema; underlying infiltration of the mediastinum cannot be excluded, given the patient's esophageal symptoms.  Original Report Authenticated By: Tonia Ghent, M.D.   Dg Chest Portable 1 View  03/11/2012  *RADIOLOGY REPORT*  Clinical Data: Chest and abdominal pain for 3 weeks.  PORTABLE CHEST - 1 VIEW  Comparison: Chest radiograph performed 03/01/2012  Findings: The lungs are well-aerated.  Mild persistent opacity overlying the aortic knob again could reflect mild pneumonia.  This is perhaps slightly improved from the prior study.  There is no evidence of pleural effusion or pneumothorax.  The cardiomediastinal silhouette is within normal limits.  No acute osseous abnormalities are seen.  IMPRESSION: Mild persistent opacity overlying the aortic knob, possibly reflecting pneumonia, as on the prior  study.  This has persisted over the past 10 days; suggest clinical correlation for symptoms of pneumonia.  If the patient does not have symptoms for pneumonia, further imaging may be considered, as deemed clinically appropriate.  Original Report Authenticated By: Tonia Ghent, M.D.     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  The es. biopsies may not be out for several days due to a long holiday weekend Use viscous Xylocaine before eating Continue PPI, Carafate     LOS: 2 days   Lina Sar  03/12/2012, 7:54 AM

## 2012-03-12 NOTE — Progress Notes (Signed)
Patient ID: Khamiyah Grefe, female   DOB: May 22, 1991, 21 y.o.   MRN: [id number] Admit Date: 03/10/2012 PCP:No primary provider on file. POA:   CONSULTS: Pulmonary Infectious Disease  PROCEDURES:  Interim history:   Fever 101.9 overnight.  EGD showed Ulcerations through out total esophagus.  HIV test reactive.  Subjective:   Objective: Vital signs in last 24 hours: Temp:  [98.5 F (36.9 C)-101.9 F (38.8 C)] 99.4 F (37.4 C) (03/29 0622) Pulse Rate:  [91-104] 104  (03/29 0440) Resp:  [7-95] 20  (03/29 0440) BP: (105-135)/(64-88) 117/75 mmHg (03/29 0440) SpO2:  [97 %-100 %] 100 % (03/29 0440) Weight change:  Last BM Date:  (pta)  Intake/Output from previous day:  Intake/Output Summary (Last 24 hours) at 03/12/12 0703 Last data filed at 03/12/12 7829  Gross per 24 hour  Intake 1410.92 ml  Output      0 ml  Net 1410.92 ml     Physical Exam:  Gen:  Awake, alert in NAD HEENT: no evidence of thrush or oropharyngeal lesions. Cardiovascular:  S1S2 RRR, no m/r/g Respiratory: CTAB, no w/r/c, no increased wob Gastrointestinal: abdomen flat, soft, ND, BS+. Mild diffuse tenderness on palpation without rebound or guarding Extremities: no c/c/e   Lab Results:  Lab 03/12/12 0610 03/11/12 0618 03/11/12 0005  HGB 10.3* 10.5* 10.4*  HCT 30.9* 31.1* 30.3*  WBC 3.7* 2.3* 3.5*  PLT 224 223 247     Lab 03/11/12 0618 03/11/12 0005  NA 138 133*  K 3.6 3.8  CL 105 100  CO2 24 23  GLUCOSE 86 87  BUN <3* 4*  CREATININE 0.61 0.62  CALCIUM 8.9 9.2  MG -- --  PHOS -- --    Studies/Results: Ct Angio Chest W/cm &/or Wo Cm  03/11/2012  *RADIOLOGY REPORT*  Clinical Data: Chest pain and back pain for 3 weeks; elevated D- dimer.  Nausea.  CT ANGIOGRAPHY CHEST    IMPRESSION:  1.  Focal 3.1 x 2.7 x 2.7 cm mass abutting the aortic arch at the periaortic region, with centrally decreased attenuation.  It is difficult to determine whether this arises from the mediastinum or from the left  upper lung lobe; surrounding atelectasis is noted, with displacement of adjacent vasculature.  Given the patient's age and its location, this may reflect a thymoma, germ cell tumor or less likely lymphoma.  If this arises from the lung, it could reflect an atypical infection such as tuberculosis, though the lack of leukocytosis or additional lung disease suggests against infection. 2.  Scattered mediastinal nodes are otherwise borderline normal in size. 3.  Diffuse haziness within the mediastinum raises question for mild diffuse associated edema; underlying infiltration of the mediastinum cannot be excluded, given the patient's esophageal symptoms.  Original Report Authenticated By: Tonia Ghent, M.D.   Dg Chest Portable 1 View  03/11/2012  *RADIOLOGY REPORT*  Clinical Data: Chest and abdominal pain for 3 weeks.  PORTABLE CHEST - 1 VIEW   IMPRESSION: Mild persistent opacity overlying the aortic knob, possibly reflecting pneumonia, as on the prior study.  This has persisted over the past 10 days; suggest clinical correlation for symptoms of pneumonia.  If the patient does not have symptoms for pneumonia, further imaging may be considered, as deemed clinically appropriate.  Original Report Authenticated By: Tonia Ghent, M.D.    Medications: Scheduled Meds:    . feeding supplement  237 mL Oral TID WC  . magic mouthwash w/lidocaine  5 mL Oral QID  . pantoprazole (PROTONIX) IV  40 mg Intravenous Q12H  . sodium chloride  3 mL Intravenous Q12H  . sucralfate  1 g Oral TID WC & HS  . DISCONTD: pantoprazole (PROTONIX) IV  40 mg Intravenous Q24H   Continuous Infusions:    . sodium chloride 125 mL/hr at 03/12/12 0350   PRN Meds:.acetaminophen, acetaminophen, lidocaine, morphine, ondansetron (ZOFRAN) IV, ondansetron, DISCONTD: butamben-tetracaine-benzocaine, DISCONTD: fentaNYL, DISCONTD: midazolam Antibiotics: Anti-infectives    None       Assessment/Plan: 1. Lung mass: Need tissue diagnosis.   Will ask TCTS to consult per pulm recommendations.  2. Odynophagia:  EGD shows completely ulcerated esophagus.  Patient will likely need Panda for nutrition.  On Ensure TID.  Pharmacy to start ganciclovir empirically until esophageal biopsies have returned.  3. Anemia, mild normocytic: check anemia panel. Monitor  4. HIV +.  Infectious Disease Consultation requested.  The patient and her mother are aware of her HIV status.  The patient does not want anyone else to know.  5. Fever.  Likely HIV related.  Will defer to ID for antibiotics.  Blood cultures pending.   Algis Downs, PA-C [facility] Pager: [phone]    LOS: 2 days    03/12/2012, 7:03 AM   Attending Patient seen and examined, able to tolerate some liquids, will hold off on placing PANDA tube, d/w ID start Ganciclovir.Agree with assessment and plan as outlined by Ms [place].  Dr Windell Norfolk

## 2012-03-13 LAB — BASIC METABOLIC PANEL
BUN: <3 mg/dL — ABNORMAL LOW (ref 6–23)
CO2: 23 mEq/L (ref 19–32)
Calcium: 9 mg/dL (ref 8.4–10.5)
Chloride: 103 mEq/L (ref 96–112)
Creatinine, Ser: 0.57 mg/dL (ref 0.50–1.10)
GFR calc Af Amer: >90 mL/min (ref >90)
GFR calc non Af Amer: >90 mL/min (ref >90)
Glucose, Bld: 89 mg/dL (ref 70–99)
Potassium: 3.7 mEq/L (ref 3.5–5.1)
Sodium: 134 mEq/L — ABNORMAL LOW (ref 135–145)

## 2012-03-13 LAB — PROTIME-INR
INR: 1.17 (ref 0.00–1.49)
Prothrombin Time: 15.1 seconds (ref 11.6–15.2)

## 2012-03-13 LAB — CBC
HCT: 29.8 % — ABNORMAL LOW (ref 36.0–46.0)
Hemoglobin: 10.1 g/dL — ABNORMAL LOW (ref 12.0–15.0)
MCH: 30.1 pg (ref 26.0–34.0)
MCHC: 33.9 g/dL (ref 30.0–36.0)
MCV: 88.7 fL (ref 78.0–100.0)
Platelets: 206 10*3/uL (ref 150–400)
RBC: 3.36 MIL/uL — ABNORMAL LOW (ref 3.87–5.11)
RDW: 15 % (ref 11.5–15.5)
WBC: 5.3 10*3/uL (ref 4.0–10.5)

## 2012-03-13 LAB — HEPATITIS B SURFACE ANTIBODY,QUALITATIVE: Hep B S Ab: NEGATIVE

## 2012-03-13 LAB — TOXOPLASMA GONDII ANTIBODY, IGG: Toxoplasma IgG Ratio: 375.1 IU/mL — ABNORMAL HIGH (ref <6.4)

## 2012-03-13 LAB — GLUCOSE, CAPILLARY
Glucose-Capillary: 88 mg/dL (ref 70–99)
Glucose-Capillary: 97 mg/dL (ref 70–99)
Glucose-Capillary: 86 mg/dL (ref 70–99)
Glucose-Capillary: 79 mg/dL (ref 70–99)
Glucose-Capillary: 94 mg/dL (ref 70–99)
Glucose-Capillary: 132 mg/dL — ABNORMAL HIGH (ref 70–99)

## 2012-03-13 LAB — HEPATITIS B SURFACE ANTIGEN: Hepatitis B Surface Ag: NEGATIVE

## 2012-03-13 LAB — TSH: TSH: 0.424 u[IU]/mL (ref 0.350–4.500)

## 2012-03-13 LAB — HEPATITIS C ANTIBODY: HCV Ab: NEGATIVE

## 2012-03-13 MED ORDER — AZITHROMYCIN 200 MG/5ML PO SUSR
1200.0000 mg | ORAL | Status: DC
  Administered 2012-03-13 – 2012-03-20 (×2): 1200 mg via ORAL
  Filled 2012-03-13 (×2): qty 30

## 2012-03-13 MED ORDER — AZITHROMYCIN 200 MG/5ML PO SUSR: 1200.0000 mg | Freq: Every day | ORAL | Status: DC

## 2012-03-13 MED ORDER — SULFAMETHOXAZOLE-TRIMETHOPRIM 200-40 MG/5ML PO SUSP
20.0000 mL | Freq: Every day | ORAL | Status: DC
  Administered 2012-03-13 – 2012-03-17 (×4): 20 mL via ORAL
  Administered 2012-03-18: 1 mL via ORAL
  Administered 2012-03-19 – 2012-03-25 (×7): 20 mL via ORAL
  Filled 2012-03-13 (×13): qty 20

## 2012-03-13 NOTE — Progress Notes (Signed)
Patient ID: Kelsey Rodriguez, female   DOB: [birth year], 21 y.o.   MRN: [id number] Patient ID: Kelsey Rodriguez, female   DOB: 12-12-1991, 21 y.o.   MRN: [id number] Admit Date: 03/10/2012 PCP:No primary provider on file. POA:   CONSULTS: Pulmonary Infectious Disease  PROCEDURES:  Interim history:   Fever 101.9 overnight.  EGD showed Ulcerations through out total esophagus.  HIV test reactive.  Subjective:   Objective: Vital signs in last 24 hours: Temp:  [98.2 F (36.8 C)-100.1 F (37.8 C)] 100.1 F (37.8 C) (03/30 0700) Pulse Rate:  [89-109] 109  (03/30 0700) Resp:  [16-18] 16  (03/30 0700) BP: (105-108)/(68-70) 106/69 mmHg (03/30 0700) SpO2:  [99 %-100 %] 99 % (03/30 0700) Weight change:  Last BM Date: 03/10/12  Intake/Output from previous day:  Intake/Output Summary (Last 24 hours) at 03/13/12 0935 Last data filed at 03/13/12 0600  Gross per 24 hour  Intake 3113.75 ml  Output      0 ml  Net 3113.75 ml     Physical Exam:  Gen:  Awake, alert in NAD HEENT: no evidence of thrush or oropharyngeal lesions. Cardiovascular:  S1S2 RRR, no m/r/g Respiratory: CTAB, no w/r/c, no increased wob Gastrointestinal: abdomen flat, soft, ND, BS+. Mild diffuse tenderness on palpation without rebound or guarding Extremities: no c/c/e   Lab Results:  Lab 03/13/12 0630 03/12/12 0610 03/11/12 0618  HGB 10.1* 10.3* 10.5*  HCT 29.8* 30.9* 31.1*  WBC 5.3 3.7* 2.3*  PLT 206 224 223     Lab 03/13/12 0630 03/11/12 0618 03/11/12 0005  NA 134* 138 133*  K 3.7 3.6 --  CL 103 105 100  CO2 23 24 23   GLUCOSE 89 86 87  BUN <3* <3* 4*  CREATININE 0.57 0.61 0.62  CALCIUM 9.0 8.9 9.2  MG -- -- --  PHOS -- -- --    Studies/Results: Ct Angio Chest W/cm &/or Wo Cm  03/11/2012  *RADIOLOGY REPORT*  Clinical Data: Chest pain and back pain for 3 weeks; elevated D- dimer.  Nausea.  CT ANGIOGRAPHY CHEST    IMPRESSION:  1.  Focal 3.1 x 2.7 x 2.7 cm mass abutting the aortic arch at the periaortic region,  with centrally decreased attenuation.  It is difficult to determine whether this arises from the mediastinum or from the left upper lung lobe; surrounding atelectasis is noted, with displacement of adjacent vasculature.  Given the patient's age and its location, this may reflect a thymoma, germ cell tumor or less likely lymphoma.  If this arises from the lung, it could reflect an atypical infection such as tuberculosis, though the lack of leukocytosis or additional lung disease suggests against infection. 2.  Scattered mediastinal nodes are otherwise borderline normal in size. 3.  Diffuse haziness within the mediastinum raises question for mild diffuse associated edema; underlying infiltration of the mediastinum cannot be excluded, given the patient's esophageal symptoms.  Original Report Authenticated By: Tonia Ghent, M.D.   Dg Chest Portable 1 View  03/11/2012  *RADIOLOGY REPORT*  Clinical Data: Chest and abdominal pain for 3 weeks.  PORTABLE CHEST - 1 VIEW   IMPRESSION: Mild persistent opacity overlying the aortic knob, possibly reflecting pneumonia, as on the prior study.  This has persisted over the past 10 days; suggest clinical correlation for symptoms of pneumonia.  If the patient does not have symptoms for pneumonia, further imaging may be considered, as deemed clinically appropriate.  Original Report Authenticated By: Tonia Ghent, M.D.    Medications: Scheduled Meds:    .  feeding supplement  237 mL Oral TID WC  . ganciclovir (CYTOVENE) IV  5 mg/kg Intravenous Q12H  . magic mouthwash w/lidocaine  5 mL Oral QID  . pantoprazole (PROTONIX) IV  40 mg Intravenous Q12H  . sodium chloride  3 mL Intravenous Q12H  . sucralfate  1 g Oral TID WC & HS  . DISCONTD: ganciclovir (CYTOVENE) IV  5 mg/kg Intravenous Q12H   Continuous Infusions:    . sodium chloride 75 mL/hr at 03/12/12 1840   PRN Meds:.acetaminophen, acetaminophen, lidocaine, morphine, ondansetron (ZOFRAN) IV, ondansetron,  zolpidem Antibiotics: Anti-infectives     Start     Dose/Rate Route Frequency Ordered Stop   03/12/12 2100   ganciclovir (CYTOVENE) 290 mg in sodium chloride 0.9 % 100 mL IVPB        5 mg/kg  57.6 kg 100 mL/hr over 60 Minutes Intravenous Every 12 hours 03/12/12 1457     03/12/12 1300   ganciclovir (CYTOVENE) 290 mg in sodium chloride 0.9 % 100 mL IVPB  Status:  Discontinued        5 mg/kg  57.6 kg 100 mL/hr over 60 Minutes Intravenous Every 12 hours 03/12/12 1150 03/12/12 1457           Assessment/Plan: 1. Lung mass: Need tissue diagnosis-Seen by Cardiothoracic-potential biospy Tuesday or Wednesday-suspicion for lymphoma.  2. Odynophagia:  EGD shows completely ulcerated esophagus.  On Ensure TID.  Pharmacy to start ganciclovir empirically until esophageal biopsies have returned.Tolerating full liquids-hold off on Panda  3. Anemia, mild normocytic: check anemia panel. Monitor  4. HIV +.  Infectious Disease Consultation appreciated.  The patient and her mother are aware of her HIV status-patient gave permission to speak freely in front of mother.  The patient does not want anyone else to know.  5. Fever.  ? B symptoms from lymphome.    Blood cultures pending-no role for antibiotics in interim.  Dr Windell Norfolk    LOS: 3 days    03/13/2012, 9:35 AM

## 2012-03-13 NOTE — Progress Notes (Signed)
Swollowing improved, tolerating full liquids. I have checked with Pathology again and they will not read the esophageal biopsies till Monday 03/15/2012.

## 2012-03-13 NOTE — Progress Notes (Signed)
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 Found to have ulcerative esophagitis and pulmonary mass of unknown etiology. Empirically on GCV for CMV esophagitis, awaiting path review  Subjective: T max of 100.1. She is having better swallowing with medicines. She reports no bowel movement in last 3 days but does not necessarily feel constipated.  No f/chills/nightsweats/n/v/diarrhea/headache  Objective: Vital signs in last 24 hours: Temp:  [98.2 F (36.8 C)-100.1 F (37.8 C)] 100.1 F (37.8 C) (03/30 0700) Pulse Rate:  [89-109] 109  (03/30 0700) Resp:  [16-18] 16  (03/30 0700) BP: (105-108)/(68-70) 106/69 mmHg (03/30 0700) SpO2:  [99 %-100 %] 99 % (03/30 0700) Gen= a x o by 4. In no acute distress. HEENT= PERRLA, EOMI, No scleral icterus. Oral pharynx is clear no thrush Neck = supple no lad Pulm= cTAB no w/c/r Cards= nl s1,s2, no g/m/r Abd= soft, decreased BS, no guarding nontender Ext= no c/c/e Skin = no rash  Lab Results  Basename 03/13/12 0630 03/12/12 0610 03/11/12 0618  WBC 5.3 3.7* --  HGB 10.1* 10.3* --  HCT 29.8* 30.9* --  NA 134* -- 138  K 3.7 -- 3.6  CL 103 -- 105  CO2 23 -- 24  BUN <3* -- <3*  CREATININE 0.57 -- 0.61  GLU -- -- --   Liver Panel  Basename 03/11/12 0618 03/11/12 0005  PROT 7.4 7.9  ALBUMIN 3.0* 3.2*  AST 12 15  ALT 10 11  ALKPHOS 65 63  BILITOT 0.2* 0.2*  BILIDIR -- --  IBILI -- --   CD4 count 20(3%) VL pending  Microbiology: Recent Results (from the past 240 hour(s))  CULTURE, BLOOD (ROUTINE X 2)     Status: Normal (Preliminary result)   Collection Time   03/12/12  6:10 AM      Component Value Range Status Comment   Specimen Description BLOOD RIGHT ARM   Final    Special Requests BOTTLES DRAWN AEROBIC AND ANAEROBIC 10CC EACH   Final    Culture  Setup Time [id number]   Final    Culture     Final    Value:        BLOOD CULTURE RECEIVED NO GROWTH TO DATE CULTURE WILL BE  HELD FOR 5 DAYS BEFORE ISSUING A FINAL NEGATIVE REPORT   Report Status PENDING   Incomplete   CULTURE, BLOOD (ROUTINE X 2)     Status: Normal (Preliminary result)   Collection Time   03/12/12  6:20 AM      Component Value Range Status Comment   Specimen Description BLOOD RIGHT WRIST   Final    Special Requests BOTTLES DRAWN AEROBIC AND ANAEROBIC 10CC EACH   Final    Culture  Setup Time [id number]   Final    Culture     Final    Value:        BLOOD CULTURE RECEIVED NO GROWTH TO DATE CULTURE WILL BE HELD FOR 5 DAYS BEFORE ISSUING A FINAL NEGATIVE REPORT   Report Status PENDING   Incomplete   SURGICAL PCR SCREEN     Status: Normal   Collection Time   03/12/12  1:06 PM      Component Value Range Status Comment   MRSA, PCR NEGATIVE  NEGATIVE  Final    Staphylococcus aureus NEGATIVE  NEGATIVE  Final     Studies/Results: No results found.  Assessment/Plan: HIV/AIDS = will still await VL results to determine what ART  to start. In meantime, we will start OI prophylaxis to receive bactrim DS suspension daily and azithromycin 1200mg  suspension Qweek.  Esophagitis = continue with GCV 5mg /kg Q12 for empiric coverage for CMV esophagitis. Await path results to see if there is inclusion bodies or other characteristics of CMV.  Intermittent fevers = please get AFB blood cultures to see if patient may also have concominant disseminated MAC  Pulmonary mass = concur that biopsy should be taken if esophageal biopsy is negative for lymphoma. We will also need to send pulm specimen for not only lymphoma work up but also to evaluate for mycobacterial infection., please send for AFB culture. Please check cryptococcal antigen.  Drug absorption = once ART are started, we will need to discontinue carafate since it can interfere with oral medication absorption.    LOS: 3 days    [name] 03/13/2012, 10:29 AM

## 2012-03-14 LAB — GLUCOSE, CAPILLARY
Glucose-Capillary: 85 mg/dL (ref 70–99)
Glucose-Capillary: 93 mg/dL (ref 70–99)
Glucose-Capillary: 116 mg/dL — ABNORMAL HIGH (ref 70–99)
Glucose-Capillary: 84 mg/dL (ref 70–99)
Glucose-Capillary: 86 mg/dL (ref 70–99)

## 2012-03-14 LAB — CRYPTOCOCCAL ANTIGEN: Crypto Ag: NEGATIVE

## 2012-03-14 NOTE — Progress Notes (Signed)
Patient ID: Kelsey Rodriguez, female   DOB: 27-Nov-1991, 21 y.o.   MRN: [id number] Patient ID: Kelsey Rodriguez, female   DOB: 18-Dec-1990, 21 y.o.   MRN: [id number] Patient ID: Kelsey Rodriguez, female   DOB: 21-Jun-1991, 21 y.o.   MRN: [id number] Admit Date: 03/10/2012 PCP:No primary provider on file. POA:   CONSULTS: Pulmonary Infectious Disease  PROCEDURES:  Interim history:   Fever 101.9 overnight.  EGD showed Ulcerations through out total esophagus.  HIV test reactive.  Subjective:   Objective: Vital signs in last 24 hours: Temp:  [98.4 F (36.9 C)-99.1 F (37.3 C)] 98.8 F (37.1 C) (03/31 0405) Pulse Rate:  [93-125] 104  (03/31 0405) Resp:  [18-20] 18  (03/31 0405) BP: (96-113)/(52-73) 96/52 mmHg (03/31 0405) SpO2:  [98 %-100 %] 100 % (03/31 0405) Weight change:  Last BM Date: 03/12/12  Intake/Output from previous day:  Intake/Output Summary (Last 24 hours) at 03/14/12 1334 Last data filed at 03/13/12 2300  Gross per 24 hour  Intake 1192.5 ml  Output      1 ml  Net 1191.5 ml     Physical Exam:  Gen:  Awake, alert in NAD HEENT: no evidence of thrush or oropharyngeal lesions. Cardiovascular:  S1S2 RRR, no m/r/g Respiratory: CTAB, no w/r/c, no increased wob Gastrointestinal: abdomen flat, soft, ND, BS+. Mild diffuse tenderness on palpation without rebound or guarding Extremities: no c/c/e   Lab Results:  Lab 03/13/12 0630 03/12/12 0610 03/11/12 0618  HGB 10.1* 10.3* 10.5*  HCT 29.8* 30.9* 31.1*  WBC 5.3 3.7* 2.3*  PLT 206 224 223     Lab 03/13/12 0630 03/11/12 0618 03/11/12 0005  NA 134* 138 133*  K 3.7 3.6 --  CL 103 105 100  CO2 23 24 23   GLUCOSE 89 86 87  BUN <3* <3* 4*  CREATININE 0.57 0.61 0.62  CALCIUM 9.0 8.9 9.2  MG -- -- --  PHOS -- -- --    Studies/Results: Ct Angio Chest W/cm &/or Wo Cm  03/11/2012  *RADIOLOGY REPORT*  Clinical Data: Chest pain and back pain for 3 weeks; elevated D- dimer.  Nausea.  CT ANGIOGRAPHY CHEST    IMPRESSION:  1.  Focal 3.1 x  2.7 x 2.7 cm mass abutting the aortic arch at the periaortic region, with centrally decreased attenuation.  It is difficult to determine whether this arises from the mediastinum or from the left upper lung lobe; surrounding atelectasis is noted, with displacement of adjacent vasculature.  Given the patient's age and its location, this may reflect a thymoma, germ cell tumor or less likely lymphoma.  If this arises from the lung, it could reflect an atypical infection such as tuberculosis, though the lack of leukocytosis or additional lung disease suggests against infection. 2.  Scattered mediastinal nodes are otherwise borderline normal in size. 3.  Diffuse haziness within the mediastinum raises question for mild diffuse associated edema; underlying infiltration of the mediastinum cannot be excluded, given the patient's esophageal symptoms.  Original Report Authenticated By: Tonia Ghent, M.D.   Dg Chest Portable 1 View  03/11/2012  *RADIOLOGY REPORT*  Clinical Data: Chest and abdominal pain for 3 weeks.  PORTABLE CHEST - 1 VIEW   IMPRESSION: Mild persistent opacity overlying the aortic knob, possibly reflecting pneumonia, as on the prior study.  This has persisted over the past 10 days; suggest clinical correlation for symptoms of pneumonia.  If the patient does not have symptoms for pneumonia, further imaging may be considered, as deemed clinically appropriate.  Original Report Authenticated By: Tonia Ghent, M.D.    Medications: Scheduled Meds:    . azithromycin  1,200 mg Oral Weekly  . feeding supplement  237 mL Oral TID WC  . ganciclovir (CYTOVENE) IV  5 mg/kg Intravenous Q12H  . magic mouthwash w/lidocaine  5 mL Oral QID  . pantoprazole (PROTONIX) IV  40 mg Intravenous Q12H  . sodium chloride  3 mL Intravenous Q12H  . sucralfate  1 g Oral TID WC & HS  . sulfamethoxazole-trimethoprim  20 mL Oral Daily   Continuous Infusions:    . sodium chloride 75 mL/hr at 03/13/12 1006   PRN  Meds:.acetaminophen, acetaminophen, lidocaine, morphine, ondansetron (ZOFRAN) IV, ondansetron, zolpidem Antibiotics: Anti-infectives     Start     Dose/Rate Route Frequency Ordered Stop   03/14/12 1000   azithromycin (ZITHROMAX) 200 MG/5ML suspension 1,200 mg  Status:  Discontinued        1,200 mg Oral Daily 03/13/12 1026 03/13/12 1041   03/13/12 1130  sulfamethoxazole-trimethoprim (BACTRIM,SEPTRA) 200-40 MG/5ML suspension 20 mL       20 mL Oral Daily 03/13/12 1026     03/13/12 1130   azithromycin (ZITHROMAX) 200 MG/5ML suspension 1,200 mg        1,200 mg Oral Weekly 03/13/12 1041     03/12/12 2100   ganciclovir (CYTOVENE) 290 mg in sodium chloride 0.9 % 100 mL IVPB        5 mg/kg  57.6 kg 100 mL/hr over 60 Minutes Intravenous Every 12 hours 03/12/12 1457     03/12/12 1300   ganciclovir (CYTOVENE) 290 mg in sodium chloride 0.9 % 100 mL IVPB  Status:  Discontinued        5 mg/kg  57.6 kg 100 mL/hr over 60 Minutes Intravenous Every 12 hours 03/12/12 1150 03/12/12 1457           Assessment/Plan: 1. Lung mass: Need tissue diagnosis-Seen by Cardiothoracic-potential biospy Tuesday or Wednesday-suspicion for lymphoma.  2. Odynophagia:  EGD shows completely ulcerated esophagus.  On Ensure TID.  On ganciclovir empirically until esophageal biopsies have returned.Tolerating full liquids-advancing to soft diet  3. Anemia, mild normocytic: check anemia panel. Monitor  4. HIV +.  Infectious Disease Consultation appreciated.  On Bactrim and Zithromax for PCP/MAI prophylaxis  5. Fever.  ? B symptoms from lymphome.    Blood cultures pending-no role for antibiotics in interim.  Dr Windell Norfolk    LOS: 4 days    03/14/2012, 1:34 PM

## 2012-03-14 NOTE — Progress Notes (Signed)
I have reviewed the above note, examined the patient and agree with plan of treatment.Advnace diet. Biopsies to be reviewed tomorrow.

## 2012-03-14 NOTE — Progress Notes (Addendum)
ID PROGRESS NOTE [name], originally from [place], newly diagnosed HIV/AIDS, CD 4 count of 20, VL pending, WB confirmation pending, found to have esophagitis, and pulmonary/mediastinal mas of unknown etiology for esophagitis, on empiric ganciclovir, PPI and OI proph with bactrim, and azithromycin  Subjective: Remains afebrile, improved dysphagia  Objective: Vital signs in last 24 hours: Temp:  [98.4 F (36.9 C)-99.1 F (37.3 C)] 98.8 F (37.1 C) (03/31 0405) Pulse Rate:  [93-125] 104  (03/31 0405) Resp:  [18-20] 18  (03/31 0405) BP: (96-113)/(52-73) 96/52 mmHg (03/31 0405) SpO2:  [98 %-100 %] 100 % (03/31 0405)     Gen= a x o by 4. In no acute distress. HEENT= PERRLA, EOMI, No scleral icterus. Oral pharynx is clear no thrush Neck = supple no lad Pulm= cTAB no w/c/r Cards= nl s1,s2, no g/m/r Abd= soft, decreased BS, no guarding nontender Ext= no c/c/e Skin = no rash  Lab Results  Basename 03/13/12 0630 03/12/12 0610  WBC 5.3 3.7*  HGB 10.1* 10.3*  HCT 29.8* 30.9*  NA 134* --  K 3.7 --  CL 103 --  CO2 23 --  BUN <3* --  CREATININE 0.57 --  GLU -- --   BMET    Component Value Date/Time   NA 134* 03/13/2012 0630   K 3.7 03/13/2012 0630   CL 103 03/13/2012 0630   CO2 23 03/13/2012 0630   GLUCOSE 89 03/13/2012 0630   BUN <3* 03/13/2012 0630   CREATININE 0.57 03/13/2012 0630   CALCIUM 9.0 03/13/2012 0630   GFRNONAA >90 03/13/2012 0630   GFRAA >90 03/13/2012 0630     Microbiology: Recent Results (from the past 240 hour(s))  CULTURE, BLOOD (ROUTINE X 2)     Status: Normal (Preliminary result)   Collection Time   03/12/12  6:10 AM      Component Value Range Status Comment   Specimen Description BLOOD RIGHT ARM   Final    Special Requests BOTTLES DRAWN AEROBIC AND ANAEROBIC 10CC EACH   Final    Culture  Setup Time [id number]   Final    Culture     Final    Value:        BLOOD CULTURE RECEIVED NO GROWTH TO DATE CULTURE WILL BE HELD FOR 5 DAYS BEFORE ISSUING A FINAL  NEGATIVE REPORT   Report Status PENDING   Incomplete   CULTURE, BLOOD (ROUTINE X 2)     Status: Normal (Preliminary result)   Collection Time   03/12/12  6:20 AM      Component Value Range Status Comment   Specimen Description BLOOD RIGHT WRIST   Final    Special Requests BOTTLES DRAWN AEROBIC AND ANAEROBIC 10CC EACH   Final    Culture  Setup Time [id number]   Final    Culture     Final    Value:        BLOOD CULTURE RECEIVED NO GROWTH TO DATE CULTURE WILL BE HELD FOR 5 DAYS BEFORE ISSUING A FINAL NEGATIVE REPORT   Report Status PENDING   Incomplete   SURGICAL PCR SCREEN     Status: Normal   Collection Time   03/12/12  1:06 PM      Component Value Range Status Comment   MRSA, PCR NEGATIVE  NEGATIVE  Final    Staphylococcus aureus NEGATIVE  NEGATIVE  Final    toxo titer = POSITIVE, 375.1 Cryptococcal antigen = NEGATIVE HepBsAg, HepBsAb = negative  Studies/Results: 03/08/2012 chest CT: 1. Focal 3.1  x 2.7 x 2.7 cm mass abutting the aortic arch at the  periaortic region, with centrally decreased attenuation. It is  difficult to determine whether this arises from the mediastinum or  from the left upper lung lobe; surrounding atelectasis is noted,  with displacement of adjacent vasculature.  Given the patient's age and its location, this may reflect a  thymoma, germ cell tumor or less likely lymphoma. If this arises  from the lung, it could reflect an atypical infection such as  tuberculosis, though the lack of leukocytosis or additional lung  disease suggests against infection.  2. Scattered mediastinal nodes are otherwise borderline normal in  size.  3. Diffuse haziness within the mediastinum raises question for  mild diffuse associated edema; underlying infiltration of the  mediastinum cannot be excluded, given the patient's esophageal  symptoms.  Assessment/Plan: HIV/AIDS = awaiting VL results to determine what ART to start. In meantime, continue with OI prophylaxis with  bactrim DS suspension daily and azithromycin 1200mg  suspension Qweek.  Esophagitis = continue with GCV 5mg /kg Q12 for empiric coverage for CMV esophagitis. Await path results to see if there is inclusion bodies or other characteristics of CMV.  Intermittent fevers = has been afebrile. Unclear why  AFB blood cultures were cancelled. We will re-order today in order to rule out  concominant disseminated MAC  Pulmonary/mediastinal mass = concur that biopsy should be taken if esophageal biopsy is negative for lymphoma. We will also need to send pulm specimen for not only lymphoma work up but also to evaluate for mycobacterial infection., please send for AFB culture.   HIV health maintenance = will check quantiferon for latent TB, as this might also influence are ART regimen.   Positive toxo titers = currently on toxo proph with bactrim DS daily. No other signs or symptoms to suggest toxo encephalitis to consider need for treatment.  Drug absorption = once ART are started, we will need to discontinue carafate since it can interfere with oral medication absorption.     LOS: 4 days    Kelsey Rodriguez 03/14/2012, 11:30 AM

## 2012-03-14 NOTE — Progress Notes (Signed)
Patient ID: Kelsey Rodriguez, female   DOB: 22-Nov-1991, 21 y.o.   MRN: [id number] [facility] Progress Note  Subjective: She is feeling better-smiling this am. Would like to try some soft food, rice etc.No abdominal pain..  Objective:  Vital signs in last 24 hours: Temp:  [98.4 F (36.9 C)-99.1 F (37.3 C)] 98.8 F (37.1 C) (03/31 0405) Pulse Rate:  [93-125] 104  (03/31 0405) Resp:  [18-20] 18  (03/31 0405) BP: (96-113)/(52-73) 96/52 mmHg (03/31 0405) SpO2:  [98 %-100 %] 100 % (03/31 0405) Last BM Date: 03/12/12 General:   Alert,  Well-developed,    in NAD Heart:  Regular rate and rhythm; no murmurs,tachy Pulm;clear Abdomen:  Soft, nontender and nondistended. Normal bowel sounds, without guarding, and without rebound.   Extremities:  Without edema. Neurologic:  Alert and  oriented x4;  grossly normal neurologically. Psych:  Alert and cooperative. Normal mood and affect.  Intake/Output from previous day: 03/30 0701 - 03/31 0700 In: 1940 [P.O.:840; I.V.:900; IV Piggyback:200] Out: 2 [Urine:2] Intake/Output this shift:    Lab Results:  Basename 03/13/12 0630 03/12/12 0610  WBC 5.3 3.7*  HGB 10.1* 10.3*  HCT 29.8* 30.9*  PLT 206 224   BMET  Basename 03/13/12 0630  NA 134*  K 3.7  CL 103  CO2 23  GLUCOSE 89  BUN <3*  CREATININE 0.57  CALCIUM 9.0   LFT No results found for this basename: PROT,ALBUMIN,AST,ALT,ALKPHOS,BILITOT,BILIDIR,IBILI in the last 72 hours PT/INR  Basename 03/13/12 0630  LABPROT 15.1  INR 1.17   Hepatitis Panel  Basename 03/13/12 0630  HEPBSAG NEGATIVE  HCVAB NEGATIVE  HEPAIGM --  HEPBIGM --    Assessment / Plan: #1 21 yo female with HIV/AIDS and ulcerative esophagitis -large deep ulcers, and achest mass abutting the aortic arch of unclear etiology. R/O lymphoma,thymoma. Bx are pending from esophagea lesions- should be out tomorrow, pt will need  Mediastinotomy if not diagnostic. Continue ganciclovir,protonix and carafate Will  advance to soft diet Active Problems:  Chest pain  Dysphagia  Lung mass  Anemia  Reflux esophagitis  Ulcer of esophagus without bleeding     LOS: 4 days   Lucca Greggs  03/14/2012, 9:25 AM

## 2012-03-15 ENCOUNTER — Encounter (HOSPITAL_COMMUNITY): Payer: Self-pay | Admitting: Internal Medicine

## 2012-03-15 LAB — GLUCOSE, CAPILLARY
Glucose-Capillary: 82 mg/dL (ref 70–99)
Glucose-Capillary: 96 mg/dL (ref 70–99)

## 2012-03-15 LAB — HEPATITIS B CORE ANTIBODY, TOTAL: Hep B Core Total Ab: NEGATIVE

## 2012-03-15 LAB — HEPATITIS A ANTIBODY, TOTAL: Hep A Total Ab: POSITIVE — AB

## 2012-03-15 LAB — ABO/RH: ABO/RH(D): O POS

## 2012-03-15 MED ORDER — DEXTROSE 5 % IV SOLN
1.5000 g | INTRAVENOUS | Status: DC
  Filled 2012-03-15 (×2): qty 1.5

## 2012-03-15 NOTE — Progress Notes (Addendum)
Plan L anterior mediastinotomy and biopsy of mediastinal mass tomorrow afternoon  wil sent specimen for lymphoma markers as well as AFB,fungal and routine cultures  Patient will be on 2300 postop, will [facility] after chest tube out 2D echo w/o effusion,good fx , no cardiomyopathy

## 2012-03-15 NOTE — Progress Notes (Signed)
Patient ID: Kelsey Rodriguez, female   DOB: September 09, 1991, 20 y.o.   MRN: [id number]   Admit Date: 03/10/2012 PCP:No primary provider on file. POA:   CONSULTS: Pulmonary Infectious Disease Gastroenterology  PROCEDURES:  Subjective: Able to tolerate liquids.  Attempting to eat rice and mashed potatoes but having difficulty.  Objective: Vital signs in last 24 hours: Temp:  [98.4 F (36.9 C)-98.6 F (37 C)] 98.5 F (36.9 C) (04/01 0538) Pulse Rate:  [91-98] 91  (04/01 0538) Resp:  [16-18] 16  (04/01 0538) BP: (96-101)/(60-67) 98/60 mmHg (04/01 0538) SpO2:  [98 %-99 %] 99 % (04/01 0538) Weight change:  Last BM Date: 03/12/12  Intake/Output from previous day:  Intake/Output Summary (Last 24 hours) at 03/15/12 0706 Last data filed at 03/14/12 1300  Gross per 24 hour  Intake    680 ml  Output      2 ml  Net    678 ml     Physical Exam:  Gen:  Awake, alert in NAD, pleasant HEENT: no evidence of thrush or oropharyngeal lesions. Cardiovascular:  S1S2 RRR, no m/r/g Respiratory: CTAB, no w/r/c, no increased wob Gastrointestinal: abdomen flat, soft, ND, BS+. Mild diffuse tenderness on palpation without rebound or guarding Extremities: no c/c/e   Lab Results:  Lab 03/13/12 0630 03/12/12 0610 03/11/12 0618  HGB 10.1* 10.3* 10.5*  HCT 29.8* 30.9* 31.1*  WBC 5.3 3.7* 2.3*  PLT 206 224 223     Lab 03/13/12 0630 03/11/12 0618 03/11/12 0005  NA 134* 138 133*  K 3.7 3.6 --  CL 103 105 100  CO2 23 24 23   GLUCOSE 89 86 87  BUN <3* <3* 4*  CREATININE 0.57 0.61 0.62  CALCIUM 9.0 8.9 9.2  MG -- -- --  PHOS -- -- --    Studies/Results: Ct Angio Chest W/cm &/or Wo Cm  03/11/2012  *RADIOLOGY REPORT*  Clinical Data: Chest pain and back pain for 3 weeks; elevated D- dimer.  Nausea.  CT ANGIOGRAPHY CHEST    IMPRESSION:  1.  Focal 3.1 x 2.7 x 2.7 cm mass abutting the aortic arch at the periaortic region, with centrally decreased attenuation.  It is difficult to determine whether this  arises from the mediastinum or from the left upper lung lobe; surrounding atelectasis is noted, with displacement of adjacent vasculature.  Given the patient's age and its location, this may reflect a thymoma, germ cell tumor or less likely lymphoma.  If this arises from the lung, it could reflect an atypical infection such as tuberculosis, though the lack of leukocytosis or additional lung disease suggests against infection. 2.  Scattered mediastinal nodes are otherwise borderline normal in size. 3.  Diffuse haziness within the mediastinum raises question for mild diffuse associated edema; underlying infiltration of the mediastinum cannot be excluded, given the patient's esophageal symptoms.  Original Report Authenticated By: Tonia Ghent, M.D.   Dg Chest Portable 1 View  03/11/2012  *RADIOLOGY REPORT*  Clinical Data: Chest and abdominal pain for 3 weeks.  PORTABLE CHEST - 1 VIEW   IMPRESSION: Mild persistent opacity overlying the aortic knob, possibly reflecting pneumonia, as on the prior study.  This has persisted over the past 10 days; suggest clinical correlation for symptoms of pneumonia.  If the patient does not have symptoms for pneumonia, further imaging may be considered, as deemed clinically appropriate.  Original Report Authenticated By: Tonia Ghent, M.D.    Medications: Scheduled Meds:    . azithromycin  1,200 mg Oral Weekly  . feeding  supplement  237 mL Oral TID WC  . ganciclovir (CYTOVENE) IV  5 mg/kg Intravenous Q12H  . magic mouthwash w/lidocaine  5 mL Oral QID  . pantoprazole (PROTONIX) IV  40 mg Intravenous Q12H  . sodium chloride  3 mL Intravenous Q12H  . sucralfate  1 g Oral TID WC & HS  . sulfamethoxazole-trimethoprim  20 mL Oral Daily   Continuous Infusions:    . sodium chloride 75 mL/hr at 03/15/12 0137   PRN Meds:.acetaminophen, acetaminophen, lidocaine, morphine, ondansetron (ZOFRAN) IV, ondansetron, zolpidem Antibiotics: Anti-infectives     Start     Dose/Rate  Route Frequency Ordered Stop   03/14/12 1000   azithromycin (ZITHROMAX) 200 MG/5ML suspension 1,200 mg  Status:  Discontinued        1,200 mg Oral Daily 03/13/12 1026 03/13/12 1041   03/13/12 1130  sulfamethoxazole-trimethoprim (BACTRIM,SEPTRA) 200-40 MG/5ML suspension 20 mL       20 mL Oral Daily 03/13/12 1026     03/13/12 1130   azithromycin (ZITHROMAX) 200 MG/5ML suspension 1,200 mg        1,200 mg Oral Weekly 03/13/12 1041     03/12/12 2100   ganciclovir (CYTOVENE) 290 mg in sodium chloride 0.9 % 100 mL IVPB        5 mg/kg  57.6 kg 100 mL/hr over 60 Minutes Intravenous Every 12 hours 03/12/12 1457     03/12/12 1300   ganciclovir (CYTOVENE) 290 mg in sodium chloride 0.9 % 100 mL IVPB  Status:  Discontinued        5 mg/kg  57.6 kg 100 mL/hr over 60 Minutes Intravenous Every 12 hours 03/12/12 1150 03/12/12 1457           Assessment/Plan: 1. Lung mass: Need tissue diagnosis-Seen by Cardiothoracic-potential biospy Tuesday or Wednesday-suspicion for lymphoma.  2. Odynophagia:  EGD shows completely ulcerated esophagus.  On Ensure TID.  On ganciclovir empirically until esophageal biopsies have returned.Tolerating full liquids-advancing to soft diet  3. Anemia, mild normocytic: check anemia panel. Monitor  4. HIV +.  Infectious Disease Consultation appreciated.  On Bactrim and Zithromax for PCP/MAI prophylaxis  5. Fever. Resolved.   ? B symptoms from lymphoma.    Blood cultures pending ( no growth to date)-no role for antibiotics in interim.   Algis Downs, PA-C [facility] Pager: [phone]   LOS: 5 days   03/15/2012, 7:06 AM   Attending I have seen and examined the patient, I agree with the assessment and plan as outlined by Ms [place].  Windell Norfolk MD

## 2012-03-15 NOTE — Progress Notes (Signed)
ID PROGRESS NOTE [name], originally from [place], newly diagnosed HIV/AIDS, CD 4 count of 20, VL pending, WB confirmation pending, found to have esophagitis, and pulmonary/mediastinal mas of unknown etiology for esophagitis, on empiric ganciclovir, PPI and OI proph with bactrim, and azithromycin  Subjective:  improved dysphagia, although still has slight chest discomfort with eating and deep inspiration. She states she denies any fevers, but did have episode of nightsweats. No n/v/diarrhea. Still constipated. Starting to eat solids without discomfort  Objective: Vital signs in last 24 hours: Temp:  [98.4 F (36.9 C)-98.6 F (37 C)] 98.5 F (36.9 C) (04/01 0538) Pulse Rate:  [91-98] 91  (04/01 0538) Resp:  [16-18] 16  (04/01 0538) BP: (96-101)/(60-67) 98/60 mmHg (04/01 0538) SpO2:  [98 %-99 %] 99 % (04/01 0538)     Gen= a x o by 4. In no acute distress. HEENT= PERRLA, EOMI, No scleral icterus. Oral pharynx is clear no thrush Neck = supple no lad Pulm= cTAB no w/c/r Cards= nl s1,s2, no g/m/r Abd= soft, decreased BS, no guarding nontender Ext= no c/c/e Skin = no rash  Lab Results  Basename 03/13/12 0630  WBC 5.3  HGB 10.1*  HCT 29.8*  NA 134*  K 3.7  CL 103  CO2 23  BUN <3*  CREATININE 0.57  GLU --   BMET    Component Value Date/Time   NA 134* 03/13/2012 0630   K 3.7 03/13/2012 0630   CL 103 03/13/2012 0630   CO2 23 03/13/2012 0630   GLUCOSE 89 03/13/2012 0630   BUN <3* 03/13/2012 0630   CREATININE 0.57 03/13/2012 0630   CALCIUM 9.0 03/13/2012 0630   GFRNONAA >90 03/13/2012 0630   GFRAA >90 03/13/2012 0630     Microbiology: Recent Results (from the past 240 hour(s))  CULTURE, BLOOD (ROUTINE X 2)     Status: Normal (Preliminary result)   Collection Time   03/12/12  6:10 AM      Component Value Range Status Comment   Specimen Description BLOOD RIGHT ARM   Final    Special Requests BOTTLES DRAWN AEROBIC AND ANAEROBIC 10CC EACH   Final    Culture  Setup Time  [id number]   Final    Culture     Final    Value:        BLOOD CULTURE RECEIVED NO GROWTH TO DATE CULTURE WILL BE HELD FOR 5 DAYS BEFORE ISSUING A FINAL NEGATIVE REPORT   Report Status PENDING   Incomplete   CULTURE, BLOOD (ROUTINE X 2)     Status: Normal (Preliminary result)   Collection Time   03/12/12  6:20 AM      Component Value Range Status Comment   Specimen Description BLOOD RIGHT WRIST   Final    Special Requests BOTTLES DRAWN AEROBIC AND ANAEROBIC 10CC EACH   Final    Culture  Setup Time [id number]   Final    Culture     Final    Value:        BLOOD CULTURE RECEIVED NO GROWTH TO DATE CULTURE WILL BE HELD FOR 5 DAYS BEFORE ISSUING A FINAL NEGATIVE REPORT   Report Status PENDING   Incomplete   SURGICAL PCR SCREEN     Status: Normal   Collection Time   03/12/12  1:06 PM      Component Value Range Status Comment   MRSA, PCR NEGATIVE  NEGATIVE  Final    Staphylococcus aureus NEGATIVE  NEGATIVE  Final   AFB CULTURE,  BLOOD     Status: Normal (Preliminary result)   Collection Time   03/14/12  4:47 PM      Component Value Range Status Comment   Specimen Description BLOOD LEFT ARM   Final    Special Requests BLACK 5CC   Final    Culture     Final    Value: CULTURE WILL BE EXAMINED FOR 6 WEEKS BEFORE ISSUING A FINAL REPORT   Report Status PENDING   Incomplete    toxo titer = POSITIVE, 375.1 Cryptococcal antigen = NEGATIVE HepBsAg, HepBsAb = negative  Studies/Results: 03/08/2012 chest CT: 1. Focal 3.1 x 2.7 x 2.7 cm mass abutting the aortic arch at the  periaortic region, with centrally decreased attenuation. It is  difficult to determine whether this arises from the mediastinum or  from the left upper lung lobe; surrounding atelectasis is noted,  with displacement of adjacent vasculature.  Given the patient's age and its location, this may reflect a  thymoma, germ cell tumor or less likely lymphoma. If this arises  from the lung, it could reflect an atypical infection such  as  tuberculosis, though the lack of leukocytosis or additional lung  disease suggests against infection.  2. Scattered mediastinal nodes are otherwise borderline normal in  size.  3. Diffuse haziness within the mediastinum raises question for  mild diffuse associated edema; underlying infiltration of the  mediastinum cannot be excluded, given the patient's esophageal  symptoms.  Assessment/Plan: Pulmonary/mediastinal mass = we recommend to get tissue biopsy of mediastinal/pulmonary mass since this likely reflects a different process than esophagitis. Please send specimen for cytology/path not only do lymphoma work up but also to evaluate for  Infection/mycobacterial infection., please send for aerobic and AFB culture.   HIV/AIDS = awaiting VL results to determine what ART to start.  In meantime, continue with OI prophylaxis with bactrim DS suspension daily and azithromycin 1200mg  suspension Qweek.  Esophagitis = continue with GCV 5mg /kg Q12 for empiric coverage for CMV esophagitis. Await path results to see if there is inclusion bodies or other characteristics of CMV. Supposedly path results will be available on 03/16/12.  Intermittent fevers = has been afebrile.   HIV health maintenance = will check quantiferon for latent TB, as this might also influence are ART regimen.   Positive toxo titers = currently on toxo proph with bactrim DS daily. No other signs or symptoms to suggest toxo encephalitis to consider need for treatment.  Drug absorption = once ART are started, we will need to discontinue carafate since it can interfere with oral medication absorption.     LOS: 5 days    [name] 03/15/2012, 1:49 PM

## 2012-03-16 ENCOUNTER — Inpatient Hospital Stay (HOSPITAL_COMMUNITY): Payer: Medicaid Other | Admitting: Anesthesiology

## 2012-03-16 ENCOUNTER — Encounter (HOSPITAL_COMMUNITY): Payer: Self-pay | Admitting: Anesthesiology

## 2012-03-16 ENCOUNTER — Inpatient Hospital Stay (HOSPITAL_COMMUNITY): Payer: Medicaid Other

## 2012-03-16 ENCOUNTER — Encounter (HOSPITAL_COMMUNITY)
Admission: EM | Disposition: A | Discharge status: Home / Self Care | Payer: Self-pay | Source: Ambulatory Visit | Attending: Internal Medicine

## 2012-03-16 DIAGNOSIS — B20 Human immunodeficiency virus [HIV] disease: Secondary | ICD-10-CM | POA: Diagnosis present

## 2012-03-16 DIAGNOSIS — R222 Localized swelling, mass and lump, trunk: Secondary | ICD-10-CM

## 2012-03-16 LAB — COMPREHENSIVE METABOLIC PANEL
ALT: 13 U/L (ref 0–35)
AST: 19 U/L (ref 0–37)
Albumin: 3.5 g/dL (ref 3.5–5.2)
Alkaline Phosphatase: 84 U/L (ref 39–117)
BUN: 6 mg/dL (ref 6–23)
CO2: 24 mEq/L (ref 19–32)
Calcium: 9.7 mg/dL (ref 8.4–10.5)
Chloride: 100 mEq/L (ref 96–112)
Creatinine, Ser: 0.64 mg/dL (ref 0.50–1.10)
GFR calc Af Amer: >90 mL/min (ref >90)
GFR calc non Af Amer: >90 mL/min (ref >90)
Glucose, Bld: 83 mg/dL (ref 70–99)
Potassium: 4 mEq/L (ref 3.5–5.1)
Sodium: 135 mEq/L (ref 135–145)
Total Bilirubin: 0.2 mg/dL — ABNORMAL LOW (ref 0.3–1.2)
Total Protein: 8.4 g/dL — ABNORMAL HIGH (ref 6.0–8.3)

## 2012-03-16 LAB — APTT: aPTT: 30 seconds (ref 24–37)

## 2012-03-16 LAB — URINALYSIS, ROUTINE W REFLEX MICROSCOPIC
Bilirubin Urine: NEGATIVE
Glucose, UA: NEGATIVE mg/dL
Hgb urine dipstick: NEGATIVE
Ketones, ur: NEGATIVE mg/dL
Leukocytes, UA: NEGATIVE
Nitrite: NEGATIVE
Protein, ur: NEGATIVE mg/dL
Specific Gravity, Urine: 1.019 (ref 1.005–1.030)
Urobilinogen, UA: 0.2 mg/dL (ref 0.0–1.0)
pH: 7.5 (ref 5.0–8.0)

## 2012-03-16 LAB — CBC
HCT: 32.6 % — ABNORMAL LOW (ref 36.0–46.0)
Hemoglobin: 10.8 g/dL — ABNORMAL LOW (ref 12.0–15.0)
MCH: 29.6 pg (ref 26.0–34.0)
MCHC: 33.1 g/dL (ref 30.0–36.0)
MCV: 89.3 fL (ref 78.0–100.0)
Platelets: 183 10*3/uL (ref 150–400)
RBC: 3.65 MIL/uL — ABNORMAL LOW (ref 3.87–5.11)
RDW: 15 % (ref 11.5–15.5)
WBC: 3.4 10*3/uL — ABNORMAL LOW (ref 4.0–10.5)

## 2012-03-16 LAB — GLUCOSE, CAPILLARY
Glucose-Capillary: 91 mg/dL (ref 70–99)
Glucose-Capillary: 80 mg/dL (ref 70–99)
Glucose-Capillary: 77 mg/dL (ref 70–99)

## 2012-03-16 LAB — PROTIME-INR
INR: 1.04 (ref 0.00–1.49)
Prothrombin Time: 13.8 seconds (ref 11.6–15.2)

## 2012-03-16 LAB — BLOOD GAS, ARTERIAL
Acid-base deficit: 0.9 mmol/L (ref 0.0–2.0)
Bicarbonate: 22.4 mEq/L (ref 20.0–24.0)
Drawn by: 332341
FIO2: 0.21 %
O2 Saturation: 98.6 %
Patient temperature: 98.6
TCO2: 23.3 mmol/L (ref 0–100)
pCO2 arterial: 31.3 mmHg — ABNORMAL LOW (ref 35.0–45.0)
pH, Arterial: 7.468 — ABNORMAL HIGH (ref 7.350–7.400)
pO2, Arterial: 109 mmHg — ABNORMAL HIGH (ref 80.0–100.0)

## 2012-03-16 LAB — BASIC METABOLIC PANEL
BUN: 6 mg/dL (ref 6–23)
CO2: 23 mEq/L (ref 19–32)
Calcium: 9.3 mg/dL (ref 8.4–10.5)
Chloride: 101 mEq/L (ref 96–112)
Creatinine, Ser: 0.64 mg/dL (ref 0.50–1.10)
GFR calc Af Amer: >90 mL/min (ref >90)
GFR calc non Af Amer: >90 mL/min (ref >90)
Glucose, Bld: 86 mg/dL (ref 70–99)
Potassium: 4 mEq/L (ref 3.5–5.1)
Sodium: 135 mEq/L (ref 135–145)

## 2012-03-16 LAB — SURGICAL PCR SCREEN
MRSA, PCR: NEGATIVE
Staphylococcus aureus: NEGATIVE

## 2012-03-16 LAB — HIV-1 RNA ULTRAQUANT REFLEX TO GENTYP+
HIV 1 RNA Quant: 228149 copies/mL — ABNORMAL HIGH (ref <20)
HIV-1 RNA Quant, Log: 5.36 {Log} — ABNORMAL HIGH (ref <1.30)

## 2012-03-16 LAB — PREPARE RBC (CROSSMATCH)

## 2012-03-16 LAB — QUANTIFERON TB GOLD ASSAY (BLOOD): Interferon Gamma Release Assay: POSITIVE — AB

## 2012-03-16 SURGERY — MEDIASTINOTOMY, CHAMBERLAIN
Anesthesia: General | Site: Chest | Laterality: Left | Wound class: Clean

## 2012-03-16 MED ORDER — NEOSTIGMINE METHYLSULFATE 1 MG/ML IJ SOLN
INTRAMUSCULAR | Status: DC | PRN
  Administered 2012-03-16: 3 mg via INTRAVENOUS

## 2012-03-16 MED ORDER — DEXTROSE 5 % IV SOLN
INTRAVENOUS | Status: AC
  Filled 2012-03-16: qty 50

## 2012-03-16 MED ORDER — SENNOSIDES-DOCUSATE SODIUM 8.6-50 MG PO TABS
1.0000 | ORAL_TABLET | Freq: Every evening | ORAL | Status: DC | PRN
  Filled 2012-03-16: qty 1

## 2012-03-16 MED ORDER — ROCURONIUM BROMIDE 100 MG/10ML IV SOLN
INTRAVENOUS | Status: DC | PRN
  Administered 2012-03-16: 40 mg via INTRAVENOUS

## 2012-03-16 MED ORDER — HYDROMORPHONE HCL PF 1 MG/ML IJ SOLN
INTRAMUSCULAR | Status: AC
  Filled 2012-03-16: qty 1

## 2012-03-16 MED ORDER — OXYCODONE-ACETAMINOPHEN 5-325 MG PO TABS
1.0000 | ORAL_TABLET | ORAL | Status: DC | PRN
  Administered 2012-03-18: 2 via ORAL
  Administered 2012-03-19 (×2): 1 via ORAL
  Filled 2012-03-16 (×2): qty 1
  Filled 2012-03-16: qty 2

## 2012-03-16 MED ORDER — ONDANSETRON HCL 4 MG/2ML IJ SOLN: 4.0000 mg | Freq: Four times a day (QID) | INTRAMUSCULAR | Status: DC | PRN

## 2012-03-16 MED ORDER — LACTATED RINGERS IV SOLN
INTRAVENOUS | Status: DC | PRN
  Administered 2012-03-16 (×2): via INTRAVENOUS

## 2012-03-16 MED ORDER — CEFUROXIME SODIUM 1.5 G IJ SOLR
INTRAMUSCULAR | Status: AC
  Filled 2012-03-16: qty 1.5

## 2012-03-16 MED ORDER — PROPOFOL 10 MG/ML IV EMUL
INTRAVENOUS | Status: DC | PRN
  Administered 2012-03-16: 130 mg via INTRAVENOUS

## 2012-03-16 MED ORDER — ONDANSETRON HCL 4 MG/2ML IJ SOLN
4.0000 mg | Freq: Four times a day (QID) | INTRAMUSCULAR | Status: DC | PRN
  Filled 2012-03-16: qty 2

## 2012-03-16 MED ORDER — HEMOSTATIC AGENTS (NO CHARGE) OPTIME
TOPICAL | Status: DC | PRN
  Administered 2012-03-16: 1 via TOPICAL

## 2012-03-16 MED ORDER — CEFUROXIME SODIUM 1.5 G IJ SOLR
INTRAMUSCULAR | Status: DC | PRN
  Administered 2012-03-16: 1.5 g via INTRAMUSCULAR

## 2012-03-16 MED ORDER — DIPHENHYDRAMINE HCL 12.5 MG/5ML PO ELIX
12.5000 mg | ORAL_SOLUTION | Freq: Four times a day (QID) | ORAL | Status: DC | PRN
  Filled 2012-03-16: qty 5

## 2012-03-16 MED ORDER — SODIUM CHLORIDE 0.9 % IJ SOLN: 9.0000 mL | INTRAMUSCULAR | Status: DC | PRN

## 2012-03-16 MED ORDER — FENTANYL CITRATE 0.05 MG/ML IJ SOLN: 50.0000 ug | Freq: Once | INTRAMUSCULAR | Status: DC

## 2012-03-16 MED ORDER — ONDANSETRON HCL 4 MG/2ML IJ SOLN: 4.0000 mg | Freq: Once | INTRAMUSCULAR | Status: DC | PRN

## 2012-03-16 MED ORDER — MIDAZOLAM HCL 5 MG/5ML IJ SOLN
INTRAMUSCULAR | Status: DC | PRN
  Administered 2012-03-16 (×2): 1 mg via INTRAVENOUS

## 2012-03-16 MED ORDER — ACETAMINOPHEN 10 MG/ML IV SOLN
1000.0000 mg | Freq: Four times a day (QID) | INTRAVENOUS | Status: AC
  Administered 2012-03-16 – 2012-03-17 (×4): 1000 mg via INTRAVENOUS
  Filled 2012-03-16 (×5): qty 100

## 2012-03-16 MED ORDER — FENTANYL CITRATE 0.05 MG/ML IJ SOLN
INTRAMUSCULAR | Status: AC
  Filled 2012-03-16: qty 2

## 2012-03-16 MED ORDER — INSULIN ASPART 100 UNIT/ML ~~LOC~~ SOLN: 0.0000 [IU] | Freq: Three times a day (TID) | SUBCUTANEOUS | Status: DC

## 2012-03-16 MED ORDER — OXYCODONE HCL 5 MG PO TABS
5.0000 mg | ORAL_TABLET | ORAL | Status: AC | PRN
  Administered 2012-03-17 (×2): 5 mg via ORAL
  Filled 2012-03-16 (×2): qty 1

## 2012-03-16 MED ORDER — GLYCOPYRROLATE 0.2 MG/ML IJ SOLN
INTRAMUSCULAR | Status: DC | PRN
  Administered 2012-03-16: .5 mg via INTRAVENOUS

## 2012-03-16 MED ORDER — NALOXONE HCL 0.4 MG/ML IJ SOLN: 0.4000 mg | INTRAMUSCULAR | Status: DC | PRN

## 2012-03-16 MED ORDER — HYDROMORPHONE HCL PF 1 MG/ML IJ SOLN
0.2500 mg | INTRAMUSCULAR | Status: DC | PRN
  Administered 2012-03-16 (×4): 0.5 mg via INTRAVENOUS

## 2012-03-16 MED ORDER — FENTANYL CITRATE 0.05 MG/ML IJ SOLN
INTRAMUSCULAR | Status: DC | PRN
  Administered 2012-03-16 (×9): 50 ug via INTRAVENOUS

## 2012-03-16 MED ORDER — DIPHENHYDRAMINE HCL 50 MG/ML IJ SOLN: 12.5000 mg | Freq: Four times a day (QID) | INTRAMUSCULAR | Status: DC | PRN

## 2012-03-16 MED ORDER — LIDOCAINE HCL (CARDIAC) 20 MG/ML IV SOLN
INTRAVENOUS | Status: DC | PRN
  Administered 2012-03-16: 50 mg via INTRAVENOUS

## 2012-03-16 MED ORDER — POTASSIUM CHLORIDE 10 MEQ/50ML IV SOLN
10.0000 meq | Freq: Every day | INTRAVENOUS | Status: DC | PRN
  Administered 2012-03-18: 10 meq via INTRAVENOUS
  Filled 2012-03-16 (×3): qty 50

## 2012-03-16 MED ORDER — ONDANSETRON HCL 4 MG/2ML IJ SOLN
INTRAMUSCULAR | Status: DC | PRN
  Administered 2012-03-16: 4 mg via INTRAVENOUS

## 2012-03-16 MED ORDER — BISACODYL 5 MG PO TBEC
10.0000 mg | DELAYED_RELEASE_TABLET | Freq: Every day | ORAL | Status: DC
  Administered 2012-03-17 – 2012-03-25 (×9): 10 mg via ORAL
  Filled 2012-03-16 (×9): qty 2

## 2012-03-16 MED ORDER — LACTATED RINGERS IV SOLN
INTRAVENOUS | Status: DC
  Administered 2012-03-16: 15:00:00 via INTRAVENOUS

## 2012-03-16 MED ORDER — FENTANYL 10 MCG/ML IV SOLN
INTRAVENOUS | Status: DC
  Administered 2012-03-16: 48 mL via INTRAVENOUS
  Administered 2012-03-16: 45 ug via INTRAVENOUS
  Administered 2012-03-17 (×3): 75 ug via INTRAVENOUS
  Administered 2012-03-17: 11:00:00 via INTRAVENOUS
  Administered 2012-03-17: 180 ug via INTRAVENOUS
  Administered 2012-03-17: 90 ug via INTRAVENOUS
  Administered 2012-03-18: 36 ug via INTRAVENOUS
  Administered 2012-03-18: 120 ug via INTRAVENOUS
  Administered 2012-03-18: 105 ug via INTRAVENOUS
  Administered 2012-03-19: 45 ug via INTRAVENOUS
  Filled 2012-03-16 (×3): qty 50

## 2012-03-16 SURGICAL SUPPLY — 56 items
BANDAGE HEMOSTAT MRDH 4X4 STRL (MISCELLANEOUS) ×1 IMPLANT
BENZOIN TINCTURE PRP APPL 2/3 (GAUZE/BANDAGES/DRESSINGS) ×3 IMPLANT
BNDG HEMOSTAT MRDH 4X4 STRL (MISCELLANEOUS) ×3
CANISTER SUCTION 2500CC (MISCELLANEOUS) ×3 IMPLANT
CATH THORACIC 28FR (CATHETERS) IMPLANT
CLIP TI MEDIUM 24 (CLIP) ×3 IMPLANT
CLIP TI WIDE RED SMALL 24 (CLIP) ×3 IMPLANT
CLOSURE STERI-STRIP 1/2X4 (GAUZE/BANDAGES/DRESSINGS) ×1
CLOTH BEACON ORANGE TIMEOUT ST (SAFETY) ×3 IMPLANT
CLSR STERI-STRIP ANTIMIC 1/2X4 (GAUZE/BANDAGES/DRESSINGS) ×2 IMPLANT
CONT SPEC 4OZ CLIKSEAL STRL BL (MISCELLANEOUS) ×15 IMPLANT
COVER SURGICAL LIGHT HANDLE (MISCELLANEOUS) ×6 IMPLANT
DRAIN CHANNEL 28F RND 3/8 FF (WOUND CARE) ×3 IMPLANT
DRAPE CAMERA CLOSED 9X96 (DRAPES) ×3 IMPLANT
DRAPE LAPAROTOMY T 102X78X121 (DRAPES) ×3 IMPLANT
DRAPE PROXIMA HALF (DRAPES) ×3 IMPLANT
ELECT REM PT RETURN 9FT ADLT (ELECTROSURGICAL) ×3
ELECTRODE REM PT RTRN 9FT ADLT (ELECTROSURGICAL) ×1 IMPLANT
GLOVE BIO SURGEON STRL SZ 6.5 (GLOVE) ×8 IMPLANT
GLOVE BIO SURGEON STRL SZ7.5 (GLOVE) ×6 IMPLANT
GLOVE BIO SURGEONS STRL SZ 6.5 (GLOVE) ×4
GLOVE BIOGEL PI IND STRL 6 (GLOVE) ×1 IMPLANT
GLOVE BIOGEL PI IND STRL 7.5 (GLOVE) ×2 IMPLANT
GLOVE BIOGEL PI INDICATOR 6 (GLOVE) ×2
GLOVE BIOGEL PI INDICATOR 7.5 (GLOVE) ×4
GLOVE BIOGEL PI ORTHO PRO 7.5 (GLOVE) ×4
GLOVE PI ORTHO PRO STRL 7.5 (GLOVE) ×2 IMPLANT
GOWN STRL NON-REIN LRG LVL3 (GOWN DISPOSABLE) ×9 IMPLANT
HEMOSTAT SURGICEL 2X14 (HEMOSTASIS) ×3 IMPLANT
KIT BASIN OR (CUSTOM PROCEDURE TRAY) ×3 IMPLANT
KIT ROOM TURNOVER OR (KITS) ×3 IMPLANT
NS IRRIG 1000ML POUR BTL (IV SOLUTION) ×6 IMPLANT
PACK CHEST (CUSTOM PROCEDURE TRAY) ×3 IMPLANT
PAD ARMBOARD 7.5X6 YLW CONV (MISCELLANEOUS) ×6 IMPLANT
SOLUTION ANTI FOG 6CC (MISCELLANEOUS) ×3 IMPLANT
SPONGE GAUZE 4X4 12PLY (GAUZE/BANDAGES/DRESSINGS) ×3 IMPLANT
SPONGE TONSIL 1.25 RF SGL STRG (GAUZE/BANDAGES/DRESSINGS) ×3 IMPLANT
STAPLER VISISTAT 35W (STAPLE) IMPLANT
SUT VIC AB 1 CTX 18 (SUTURE) ×3 IMPLANT
SUT VIC AB 1 CTX 27 (SUTURE) IMPLANT
SUT VIC AB 2-0 CT1 18 (SUTURE) ×3 IMPLANT
SUT VIC AB 2-0 CT1 27 (SUTURE) ×2
SUT VIC AB 2-0 CT1 TAPERPNT 27 (SUTURE) ×1 IMPLANT
SUT VIC AB 2-0 CTX 36 (SUTURE) IMPLANT
SUT VIC AB 3-0 SH 27 (SUTURE) ×2
SUT VIC AB 3-0 SH 27XBRD (SUTURE) ×1 IMPLANT
SUT VIC AB 3-0 X1 27 (SUTURE) ×3 IMPLANT
SUT VICRYL 2 TP 1 (SUTURE) ×3 IMPLANT
SYRINGE 10CC LL (SYRINGE) ×6 IMPLANT
SYSTEM SAHARA CHEST DRAIN RE-I (WOUND CARE) ×3 IMPLANT
TAPE CLOTH SURG 4X10 WHT LF (GAUZE/BANDAGES/DRESSINGS) ×3 IMPLANT
TOWEL OR 17X24 6PK STRL BLUE (TOWEL DISPOSABLE) ×6 IMPLANT
TOWEL OR 17X26 10 PK STRL BLUE (TOWEL DISPOSABLE) ×6 IMPLANT
TRAY FOLEY CATH 14FR (SET/KITS/TRAYS/PACK) ×3 IMPLANT
TRAY FOLEY IC TEMP SENS 14FR (CATHETERS) IMPLANT
WATER STERILE IRR 1000ML POUR (IV SOLUTION) ×6 IMPLANT

## 2012-03-16 NOTE — Preoperative (Signed)
Beta Blockers   Reason not to administer Beta Blockers:Not Applicable 

## 2012-03-16 NOTE — Anesthesia Preprocedure Evaluation (Addendum)
Anesthesia Evaluation  Patient identified by MRN, date of birth, ID band Patient awake    Reviewed: Allergy & Precautions, H&P , NPO status , Patient's Chart, lab work & pertinent test results  Airway Mallampati: I      Dental  (+) Teeth Intact   Pulmonary  breath sounds clear to auscultation        Cardiovascular Rhythm:Regular Rate:Normal     Neuro/Psych    GI/Hepatic PUD,   Endo/Other    Renal/GU      Musculoskeletal   Abdominal (+)  Abdomen: soft. Bowel sounds: normal.  Peds  Hematology  (+) HIV,   Anesthesia Other Findings   Reproductive/Obstetrics                        Anesthesia Physical Anesthesia Plan  ASA: II  Anesthesia Plan: General   Post-op Pain Management:    Induction: Intravenous  Airway Management Planned: Oral ETT  Additional Equipment:   Intra-op Plan:   Post-operative Plan: Extubation in OR  Informed Consent: I have reviewed the patients History and Physical, chart, labs and discussed the procedure including the risks, benefits and alternatives for the proposed anesthesia with the patient or authorized representative who has indicated his/her understanding and acceptance.   Dental advisory given  Plan Discussed with:   Anesthesia Plan Comments: (Chest mass with odynophagia  Plan GA with ETT  Kipp Brood, MD)       Anesthesia Quick Evaluation

## 2012-03-16 NOTE — Progress Notes (Signed)
   CARE MANAGEMENT NOTE 03/16/2012  Patient:  Kelsey Rodriguez, Kelsey Rodriguez   Account Number:  [id number]  Date Initiated:  03/11/2012  Documentation initiated by:  Donn Pierini  Subjective/Objective Assessment:   Pt admitted with chest pain, ?mediastinal mass- workup in progress     Action/Plan:   PTA pt lived at home with parent, was independent with ADLs   Anticipated DC Date:  03/15/2012   Anticipated DC Plan:  HOME/SELF CARE      DC Planning Services  CM consult      Choice offered to / List presented to:             Status of service:  In process, will continue to follow Medicare Important Message given?   (If response is "NO", the following Medicare IM given date fields will be blank) Date Medicare IM given:   Date Additional Medicare IM given:    Discharge Disposition:    Per UR Regulation:    If discussed at Long Length of Stay Meetings, dates discussed:    Comments:  03/16/12- 1145- Donn Pierini RN, BSN [phone] Pt for OR today for  L anterior mediastinotomy and biopsy of mediastinal mass today- to go to 2300 post op- CM to follow- plan to return home with mom.  03/11/12- 1500- Donn Pierini RN BSN [phone] Pt off unit for test- CM to follow for discharge needs- work up in progress- surgical eval pending

## 2012-03-16 NOTE — Transfer of Care (Signed)
Immediate Anesthesia Transfer of Care Note  Patient: Kelsey Rodriguez  Procedure(s) Performed: Procedure(s) (LRB): MEDIASTINOTOMY CHAMBERLAIN MCNEIL (Left)  Patient Location: PACU  Anesthesia Type: General  Level of Consciousness: awake, alert  and oriented  Airway & Oxygen Therapy: Patient Spontanous Breathing and Patient connected to face mask oxygen  Post-op Assessment: Report given to PACU RN and Post -op Vital signs reviewed and stable  Post vital signs: Reviewed and stable  Complications: No apparent anesthesia complications

## 2012-03-16 NOTE — Progress Notes (Signed)
Patient ID: Kelsey Rodriguez, female   DOB: [birth year], 21 y.o.   MRN: [id number]   Admit Date: 03/10/2012 PCP:No primary provider on file. POA:   CONSULTS: Pulmonary Infectious Disease Gastroenterology Thoracic Surgery  PROCEDURES: Upper Endoscopy 03/11/12 with biopsies of the esophagus.  Pathology Pending.  Subjective: Able to tolerate liquids. No complaints. Very pleasant.  Understands 50 - 75% English  Objective: Vital signs in last 24 hours: Temp:  [98.4 F (36.9 C)-99.4 F (37.4 C)] 99 F (37.2 C) (04/02 0546) Pulse Rate:  [90-111] 111  (04/02 0546) Resp:  [16-18] 16  (04/02 0546) BP: (98-105)/(60-71) 98/65 mmHg (04/02 0546) SpO2:  [98 %-100 %] 100 % (04/02 0546) Weight change:  Last BM Date: 03/15/12  Intake/Output from previous day:  Intake/Output Summary (Last 24 hours) at 03/16/12 1116 Last data filed at 03/15/12 1806  Gross per 24 hour  Intake    480 ml  Output      1 ml  Net    479 ml     Physical Exam:  Gen:  Awake, alert in NAD, pleasant HEENT: no evidence of thrush or oropharyngeal lesions. Cardiovascular:  S1S2 RRR, no m/r/g Respiratory: CTAB, no w/r/c, no increased wob Gastrointestinal: abdomen flat, soft, ND, BS+. Mild diffuse tenderness on palpation without rebound or guarding Extremities: no c/c/e   Lab Results:   Results for Kelsey, HOLIAN (MRN [id number]) as of 03/16/2012 11:12  Ref. Range 03/11/2012 06:18 03/11/2012 13:57 03/12/2012 10:00 03/13/2012 06:30 03/13/2012 14:26  Hep A Total Ab Latest Range: NEGATIVE     POSITIVE (A)   Hepatitis B Surface Ag Latest Range: NEGATIVE     NEGATIVE   Hep B S Ab Latest Range: NEGATIVE     NEGATIVE   Hep B Core Total Ab Latest Range: NEGATIVE     NEGATIVE   HCV Ab Latest Range: NEGATIVE     NEGATIVE   Crypto Ag Latest Range: NEGATIVE      NEGATIVE  Cryptococcal Ag Titer Latest Range: NOT INDICATED      NOT INDICATED  Toxoplasma IgG Ratio Latest Range: <6.4 IU/mL    375.1 (H)   HIV 1 RNA Quant Latest Range: <20  copies/mL   228149 (H)    HIV1 RNA Quant, Log Latest Range: <1.30 log 10   5.36 (H)    HIV Latest Range: NON REACTIVE  Reactive (A) Reactive (A)         Lab 03/16/12 0857 03/13/12 0630 03/12/12 0610  HGB 10.8* 10.1* 10.3*  HCT 32.6* 29.8* 30.9*  WBC 3.4* 5.3 3.7*  PLT 183 206 224     Lab 03/16/12 0857 03/16/12 0611 03/13/12 0630 03/11/12 0618 03/11/12 0005  NA 135 135 134* 138 133*  K 4.0 4.0 -- -- --  CL 100 101 103 105 100  CO2 24 23 23 24 23   GLUCOSE 83 86 89 86 87  BUN 6 6 <3* <3* 4*  CREATININE 0.64 0.64 0.57 0.61 0.62  CALCIUM 9.7 9.3 9.0 8.9 9.2  MG -- -- -- -- --  PHOS -- -- -- -- --    Studies/Results: Ct Angio Chest W/cm &/or Wo Cm  03/11/2012  *RADIOLOGY REPORT*  Clinical Data: Chest pain and back pain for 3 weeks; elevated D- dimer.  Nausea.  CT ANGIOGRAPHY CHEST    IMPRESSION:  1.  Focal 3.1 x 2.7 x 2.7 cm mass abutting the aortic arch at the periaortic region, with centrally decreased attenuation.  It is difficult to determine whether this arises  from the mediastinum or from the left upper lung lobe; surrounding atelectasis is noted, with displacement of adjacent vasculature.  Given the patient's age and its location, this may reflect a thymoma, germ cell tumor or less likely lymphoma.  If this arises from the lung, it could reflect an atypical infection such as tuberculosis, though the lack of leukocytosis or additional lung disease suggests against infection. 2.  Scattered mediastinal nodes are otherwise borderline normal in size. 3.  Diffuse haziness within the mediastinum raises question for mild diffuse associated edema; underlying infiltration of the mediastinum cannot be excluded, given the patient's esophageal symptoms.  Original Report Authenticated By: Tonia Ghent, M.D.   Dg Chest Portable 1 View  03/11/2012  *RADIOLOGY REPORT*  Clinical Data: Chest and abdominal pain for 3 weeks.  PORTABLE CHEST - 1 VIEW   IMPRESSION: Mild persistent opacity overlying the  aortic knob, possibly reflecting pneumonia, as on the prior study.  This has persisted over the past 10 days; suggest clinical correlation for symptoms of pneumonia.  If the patient does not have symptoms for pneumonia, further imaging may be considered, as deemed clinically appropriate.  Original Report Authenticated By: Tonia Ghent, M.D.    Medications: Scheduled Meds:    . azithromycin  1,200 mg Oral Weekly  . cefUROXime (ZINACEF)  IV  1.5 g Intravenous 60 min Pre-Op  . feeding supplement  237 mL Oral TID WC  . ganciclovir (CYTOVENE) IV  5 mg/kg Intravenous Q12H  . magic mouthwash w/lidocaine  5 mL Oral QID  . pantoprazole (PROTONIX) IV  40 mg Intravenous Q12H  . sodium chloride  3 mL Intravenous Q12H  . sucralfate  1 g Oral TID WC & HS  . sulfamethoxazole-trimethoprim  20 mL Oral Daily   Continuous Infusions:   PRN Meds:.acetaminophen, acetaminophen, lidocaine, morphine, ondansetron (ZOFRAN) IV, ondansetron, zolpidem Antibiotics: Anti-infectives     Start     Dose/Rate Route Frequency Ordered Stop   03/16/12 0600   cefUROXime (ZINACEF) 1.5 g in dextrose 5 % 50 mL IVPB        1.5 g 100 mL/hr over 30 Minutes Intravenous 60 min pre-op 03/15/12 1916     03/14/12 1000   azithromycin (ZITHROMAX) 200 MG/5ML suspension 1,200 mg  Status:  Discontinued        1,200 mg Oral Daily 03/13/12 1026 03/13/12 1041   03/13/12 1130  sulfamethoxazole-trimethoprim (BACTRIM,SEPTRA) 200-40 MG/5ML suspension 20 mL       20 mL Oral Daily 03/13/12 1026     03/13/12 1130   azithromycin (ZITHROMAX) 200 MG/5ML suspension 1,200 mg        1,200 mg Oral Weekly 03/13/12 1041     03/12/12 2100   ganciclovir (CYTOVENE) 290 mg in sodium chloride 0.9 % 100 mL IVPB        5 mg/kg  57.6 kg 100 mL/hr over 60 Minutes Intravenous Every 12 hours 03/12/12 1457     03/12/12 1300   ganciclovir (CYTOVENE) 290 mg in sodium chloride 0.9 % 100 mL IVPB  Status:  Discontinued        5 mg/kg  57.6 kg 100 mL/hr over 60  Minutes Intravenous Every 12 hours 03/12/12 1150 03/12/12 1457           Assessment/Plan:  [name], originally from [place] (speaks and understands 50 -  75% English), newly diagnosed HIV/AIDS, CD 4 count of 20, VL pending, WB confirmation pending.  Her mother is aware of her HIV status, but she prefers that  no one else know.  The patient has severe odynphagia, and via EGD was found to have severe ulcerative esophagitis (biopsies are pending).  She was started on ganciclovir empirically for esophagitis..  She is now able to tolerate ensure liquid.  Patient has on-going mild chest pain from esophagitis and large pulmonary/mediastinal mass of unknown etiology.  Going for Biopsy of mediatinal mass by [facility] 4/2.   Infectious disease has started the patient on Bactrim and Zithromax for PCP/MAI prophylaxis.  1. Lung mass: Need tissue diagnosis- Will have biopsy done by [facility] 03/16/12.  2. Odynophagia:  EGD shows completely ulcerated esophagus.  On Ensure TID.  On ganciclovir empirically until esophageal biopsies have returned.Tolerating full liquids-advancing to soft diet  3. Anemia, mild normocytic: check anemia panel. Monitor  4. HIV +.  Infectious Disease Consultation appreciated.  On Bactrim and Zithromax for PCP/MAI prophylaxis  5. Fever. Resolved.   ? B symptoms from lymphoma.    Blood cultures pending ( no growth to date)-no role for antibiotics in interim.   Algis Downs, PA-C [facility] Pager: [phone]   LOS: 6 days   03/16/2012, 11:16 AM   Attending: Seen and examined, agree with assessment and plan as outlined by Ms York, patient due for mediastinal biopsy later today, following which she will need oncology consultation.

## 2012-03-16 NOTE — Progress Notes (Signed)
Nutrition Follow-up  Diet Order:  Dysphagia 3, thin liquids. Ensure Complete TID PO intake has been improved last several days, 75-100% Pt states that her intake is getting better, less pain with swallowing. Is trying to drink 2-3 Ensure daily.   New DX of HIV. Dysphagia has improved, but still with some discomfort. Was not able to take barium for MBS, no swallow eval was completed. Esophagitis/ulcers per GI work up. Was considering panda placement at one time, but intake improved. Also has chest/lung mass, still of unknown etiology. Plan is for mediastinotomy and biopsy of mass today.  Low grade fevers intermittently.   Meds: Scheduled Meds:   . azithromycin  1,200 mg Oral Weekly  . cefUROXime (ZINACEF)  IV  1.5 g Intravenous 60 min Pre-Op  . feeding supplement  237 mL Oral TID WC  . ganciclovir (CYTOVENE) IV  5 mg/kg Intravenous Q12H  . magic mouthwash w/lidocaine  5 mL Oral QID  . pantoprazole (PROTONIX) IV  40 mg Intravenous Q12H  . sodium chloride  3 mL Intravenous Q12H  . sucralfate  1 g Oral TID WC & HS  . sulfamethoxazole-trimethoprim  20 mL Oral Daily   Continuous Infusions:  PRN Meds:.acetaminophen, acetaminophen, lidocaine, morphine, ondansetron (ZOFRAN) IV, ondansetron, zolpidem  Labs:  CMP     Component Value Date/Time   NA 135 03/16/2012 0611   K 4.0 03/16/2012 0611   CL 101 03/16/2012 0611   CO2 23 03/16/2012 0611   GLUCOSE 86 03/16/2012 0611   BUN 6 03/16/2012 0611   CREATININE 0.64 03/16/2012 0611   CALCIUM 9.3 03/16/2012 0611   PROT 7.4 03/11/2012 0618   ALBUMIN 3.0* 03/11/2012 0618   AST 12 03/11/2012 0618   ALT 10 03/11/2012 0618   ALKPHOS 65 03/11/2012 0618   BILITOT 0.2* 03/11/2012 0618   GFRNONAA >90 03/16/2012 0611   GFRAA >90 03/16/2012 0611     Intake/Output Summary (Last 24 hours) at 03/16/12 0957 Last data filed at 03/15/12 1806  Gross per 24 hour  Intake    480 ml  Output      1 ml  Net    479 ml    Weight Status:  No new weights have been obtained.    Re-estimated needs:  1800-2000 kcal, 70-80 gm protein  Nutrition Dx:  Inadequate oral intake, improving  Goal:  Pt will consume adequate PO intake to meet needs once diet is advanced, met New Goal: PO intake will continue to be >75% of most meals  Intervention:   1. Continue with Ensure Complete TID. 2. Magic Cups were not added to trays as Ensure was added by MD  Monitor:  PO intake, weight, labs, results of biopsy   Rudean Haskell Pager #:  [phone]

## 2012-03-16 NOTE — Anesthesia Procedure Notes (Signed)
Procedure Name: Intubation Date/Time: 03/16/2012 3:32 PM Performed by: Julianne Rice Z Pre-anesthesia Checklist: Patient identified, Timeout performed, Emergency Drugs available, Suction available and Patient being monitored Patient Re-evaluated:Patient Re-evaluated prior to inductionOxygen Delivery Method: Circle system utilized Preoxygenation: Pre-oxygenation with 100% oxygen Intubation Type: Combination inhalational/ intravenous induction Ventilation: Mask ventilation without difficulty Laryngoscope Size: Mac and 3 Grade View: Grade I Tube type: Oral Endobronchial tube: Double lumen EBT, EBT position confirmed by auscultation and EBT position confirmed by fiberoptic bronchoscope and 37 Fr Number of attempts: 1 Airway Equipment and Method: Stylet Placement Confirmation: ETT inserted through vocal cords under direct vision,  breath sounds checked- equal and bilateral and positive ETCO2 Secured at: 28.5 cm Tube secured with: Tape Dental Injury: Teeth and Oropharynx as per pre-operative assessment

## 2012-03-16 NOTE — Brief Op Note (Signed)
03/10/2012 - 03/16/2012  4:58 PM  PATIENT:  Kelsey Rodriguez  21 y.o. female  PRE-OPERATIVE DIAGNOSIS:  mediastinal mass  POST-OPERATIVE DIAGNOSIS:  mediastinal mass  PROCEDURE:  Procedure(s) (LRB): MEDIASTINOTOMY CHAMBERLAIN MCNEIL with biopsy of mediastinal mass SURGEON:  Surgeon(s) and Role:    * Kerin Perna, MD - Primary  PHYSICIAN ASSISTANT: Lowella Dandy PA-C  ANESTHESIA:   general  EBL:  Total I/O In: 1500 [I.V.:1500] Out: 180 [Urine:130; Blood:50]  DRAINS: 1 chest tube left pleural space   SPECIMEN:  Source of Specimen:  Mediastinal mass  DISPOSITION OF SPECIMEN:  1. Pathology 2. Microbiology lab  COUNTS:  YES  TOURNIQUET:  * No tourniquets in log *  PATIENT DISPOSITION:  PACU - hemodynamically stable.

## 2012-03-17 ENCOUNTER — Inpatient Hospital Stay (HOSPITAL_COMMUNITY): Payer: Medicaid Other

## 2012-03-17 ENCOUNTER — Other Ambulatory Visit: Payer: Self-pay | Admitting: *Deleted

## 2012-03-17 DIAGNOSIS — B20 Human immunodeficiency virus [HIV] disease: Secondary | ICD-10-CM

## 2012-03-17 DIAGNOSIS — Z21 Asymptomatic human immunodeficiency virus [HIV] infection status: Secondary | ICD-10-CM

## 2012-03-17 DIAGNOSIS — K2100 Gastro-esophageal reflux disease with esophagitis, without bleeding: Secondary | ICD-10-CM

## 2012-03-17 LAB — BASIC METABOLIC PANEL
BUN: 5 mg/dL — ABNORMAL LOW (ref 6–23)
CO2: 26 mEq/L (ref 19–32)
Calcium: 9.2 mg/dL (ref 8.4–10.5)
Chloride: 100 mEq/L (ref 96–112)
Creatinine, Ser: 0.53 mg/dL (ref 0.50–1.10)
GFR calc Af Amer: >90 mL/min (ref >90)
GFR calc non Af Amer: >90 mL/min (ref >90)
Glucose, Bld: 89 mg/dL (ref 70–99)
Potassium: 4 mEq/L (ref 3.5–5.1)
Sodium: 135 mEq/L (ref 135–145)

## 2012-03-17 LAB — POCT I-STAT 3, ART BLOOD GAS (G3+)
Bicarbonate: 26 mEq/L — ABNORMAL HIGH (ref 20.0–24.0)
O2 Saturation: 99 %
TCO2: 27 mmol/L (ref 0–100)
pCO2 arterial: 46.2 mmHg — ABNORMAL HIGH (ref 35.0–45.0)
pH, Arterial: 7.359 (ref 7.350–7.400)
pO2, Arterial: 127 mmHg — ABNORMAL HIGH (ref 80.0–100.0)

## 2012-03-17 LAB — GLUCOSE, CAPILLARY
Glucose-Capillary: 104 mg/dL — ABNORMAL HIGH (ref 70–99)
Glucose-Capillary: 60 mg/dL — ABNORMAL LOW (ref 70–99)
Glucose-Capillary: 86 mg/dL (ref 70–99)

## 2012-03-17 LAB — DIFFERENTIAL
Basophils Absolute: 0 10*3/uL (ref 0.0–0.1)
Basophils Relative: 0 % (ref 0–1)
Eosinophils Absolute: 0 10*3/uL (ref 0.0–0.7)
Eosinophils Relative: 2 % (ref 0–5)
Lymphocytes Relative: 19 % (ref 12–46)
Lymphs Abs: 0.5 10*3/uL — ABNORMAL LOW (ref 0.7–4.0)
Monocytes Absolute: 0.3 10*3/uL (ref 0.1–1.0)
Monocytes Relative: 10 % (ref 3–12)
Neutro Abs: 1.7 10*3/uL (ref 1.7–7.7)
Neutrophils Relative %: 69 % (ref 43–77)

## 2012-03-17 LAB — CBC
HCT: 28.9 % — ABNORMAL LOW (ref 36.0–46.0)
Hemoglobin: 9.8 g/dL — ABNORMAL LOW (ref 12.0–15.0)
MCH: 29.5 pg (ref 26.0–34.0)
MCHC: 33.9 g/dL (ref 30.0–36.0)
MCV: 87 fL (ref 78.0–100.0)
Platelets: 135 10*3/uL — ABNORMAL LOW (ref 150–400)
RBC: 3.32 MIL/uL — ABNORMAL LOW (ref 3.87–5.11)
RDW: 14.7 % (ref 11.5–15.5)
WBC: 2.4 10*3/uL — ABNORMAL LOW (ref 4.0–10.5)

## 2012-03-17 LAB — HIV 1/2 CONFIRMATION
HIV-1 antibody: POSITIVE
HIV-2 Ab: NEGATIVE

## 2012-03-17 LAB — HERPES SIMPLEX VIRUS CULTURE: Culture: DETECTED

## 2012-03-17 LAB — CYTOMEGALOVIRUS PCR, QUALITATIVE: Cytomegalovirus DNA: NOT DETECTED

## 2012-03-17 MED ORDER — EMTRICITABINE-TENOFOVIR DF 200-300 MG PO TABS: 1.0000 | ORAL_TABLET | Freq: Every day | ORAL | Status: DC

## 2012-03-17 MED ORDER — SODIUM CHLORIDE 0.9 % IJ SOLN
10.0000 mL | Freq: Two times a day (BID) | INTRAMUSCULAR | Status: DC
  Administered 2012-03-17: 10 mL
  Administered 2012-03-18: 20 mL
  Administered 2012-03-18: 10 mL
  Administered 2012-03-19 – 2012-03-20 (×2): 20 mL
  Filled 2012-03-17 (×3): qty 20
  Filled 2012-03-17 (×2): qty 10
  Filled 2012-03-17: qty 20

## 2012-03-17 MED ORDER — PANTOPRAZOLE SODIUM 40 MG PO TBEC
40.0000 mg | DELAYED_RELEASE_TABLET | Freq: Every day | ORAL | Status: DC
  Administered 2012-03-18 – 2012-03-25 (×7): 40 mg via ORAL
  Filled 2012-03-17 (×7): qty 1

## 2012-03-17 MED ORDER — TUBERCULIN PPD 5 UNIT/0.1ML ID SOLN
5.0000 [IU] | Freq: Once | INTRADERMAL | Status: AC
  Administered 2012-03-17: 5 [IU] via INTRADERMAL
  Filled 2012-03-17 (×2): qty 0.1

## 2012-03-17 MED ORDER — CEFUROXIME SODIUM 1.5 G IJ SOLR
1.5000 g | Freq: Two times a day (BID) | INTRAMUSCULAR | Status: DC
  Filled 2012-03-17 (×3): qty 1.5

## 2012-03-17 MED ORDER — DEXTROSE 5 % IV SOLN
1.5000 g | Freq: Two times a day (BID) | INTRAVENOUS | Status: DC
  Filled 2012-03-17 (×2): qty 1.5

## 2012-03-17 MED ORDER — VALACYCLOVIR HCL 500 MG PO TABS
1000.0000 mg | ORAL_TABLET | Freq: Two times a day (BID) | ORAL | Status: DC
  Administered 2012-03-17 – 2012-03-23 (×13): 1000 mg via ORAL
  Filled 2012-03-17 (×14): qty 2

## 2012-03-17 MED ORDER — SODIUM CHLORIDE 0.9 % IJ SOLN
10.0000 mL | INTRAMUSCULAR | Status: DC | PRN
  Administered 2012-03-17 – 2012-03-24 (×6): 10 mL
  Filled 2012-03-17 (×2): qty 20

## 2012-03-17 MED ORDER — RALTEGRAVIR POTASSIUM 400 MG PO TABS: 400.0000 mg | ORAL_TABLET | Freq: Two times a day (BID) | ORAL | Status: DC

## 2012-03-17 MED ORDER — DEXTROSE 5 % IV SOLN
1.5000 g | Freq: Two times a day (BID) | INTRAVENOUS | Status: AC
  Administered 2012-03-17 – 2012-03-18 (×4): 1.5 g via INTRAVENOUS
  Filled 2012-03-17 (×5): qty 1.5

## 2012-03-17 MED ORDER — KETOROLAC TROMETHAMINE 15 MG/ML IJ SOLN
15.0000 mg | Freq: Four times a day (QID) | INTRAMUSCULAR | Status: AC
  Administered 2012-03-17 – 2012-03-19 (×7): 15 mg via INTRAVENOUS
  Filled 2012-03-17 (×8): qty 1

## 2012-03-17 MED FILL — Hydromorphone HCl Inj 1 MG/ML: INTRAMUSCULAR | Qty: 1 | Status: AC

## 2012-03-17 NOTE — Transfer of Care (Signed)
Immediate Anesthesia Transfer of Care Note  Patient: Kelsey Rodriguez  Procedure(s) Performed: Procedure(s) (LRB): MEDIASTINOTOMY CHAMBERLAIN MCNEIL (Left)  Patient Location: PACU  Anesthesia Type: General  Level of Consciousness: awake, alert , oriented and sedated  Airway & Oxygen Therapy: Patient Spontanous Breathing and Patient connected to nasal cannula oxygen  Post-op Assessment: Report given to PACU RN  Post vital signs: Reviewed and stable  Complications: No apparent anesthesia complications

## 2012-03-17 NOTE — Anesthesia Postprocedure Evaluation (Signed)
  Anesthesia Post-op Note  Patient: Kelsey Rodriguez  Procedure(s) Performed: Procedure(s) (LRB): MEDIASTINOTOMY CHAMBERLAIN MCNEIL (Left)  Patient Location: PACU and Nursing Unit  Anesthesia Type: General  Level of Consciousness: awake, alert  and oriented  Airway and Oxygen Therapy: Patient Spontanous Breathing  Post-op Pain: mild  Post-op Assessment: Post-op Vital signs reviewed, PATIENT'S CARDIOVASCULAR STATUS UNSTABLE, Respiratory Function Stable, Patent Airway, No signs of Nausea or vomiting, Adequate PO intake and Pain level controlled  Post-op Vital Signs: Reviewed and stable  Complications: No apparent anesthesia complications

## 2012-03-17 NOTE — Op Note (Signed)
Kelsey Rodriguez, Kelsey Rodriguez                  ACCOUNT NO.:  [id number]  MEDICAL RECORD NO.:  [id number]  LOCATION:  2304                         FACILITY:  [facility]  PHYSICIAN:  Kerin Perna, M.D.  DATE OF BIRTH:  [birth year]  DATE OF PROCEDURE:  03/16/2012 DATE OF DISCHARGE:                              OPERATIVE REPORT   OPERATION:  Left Chamberlain procedure (anterior mediastinotomy and biopsy).  PREOPERATIVE DIAGNOSIS:  Left anterior mediastinal mass.  POSTOPERATIVE DIAGNOSIS:  Left anterior mediastinal mass.  SURGEON:  Kerin Perna, MD  ASSISTANT:  Lowella Dandy, PA-C  ANESTHESIA:  General.  INDICATIONS:  The patient is a 21 year old black female with HIV infection who presents with chest pain, esophagitis, and an anterior mediastinal mass measuring 3 cm.  It is suspicious for lymphoma and Chamberlain procedure and biopsy was recommended.  I discussed the procedure in detail with the patient and her family and she understood and agreed to proceed.  She understood the risks of bleeding.  PROCEDURE:  The patient was brought to the operating room and placed in supine on the operating room table where general anesthesia was induced. A double-lumen endotracheal tube was positioned by the anesthesiologist. The chest was prepped and draped as a sterile field.  Then, a proper time-out was performed.  An incision was made in the third interspace anteriorly.  A Weitlaner retractor was placed through the soft tissue, but the ribs were not spread.  A scope was inserted and a firm mass was noted along the superior left mediastinum.  The mediastinal pleura was dissected off and using a 15-blade scalpel, generous excisional biopsies were performed of this mass.  They were sent for pathology, lymphoma markers, and AFB fungal cultures.  There were some bleeding, which was controlled with topical hemostatic agent.  A 28-French chest tube was placed and directed to the apex through a separate  incision.  The lung was re-expanded under direct vision.  The ribs were approximated with a pericostal #2 Vicryl.  The muscle was closed with interrupted #1 Vicryl. The subcutaneous and skin layers were closed with running Vicryl.  The patient was extubated and returned to the recovery room in stable condition.     Kerin Perna, M.D.    PV/MEDQ  D:  03/16/2012  T:  03/17/2012  Job:  [id number]

## 2012-03-17 NOTE — Progress Notes (Signed)
  Note results of esophageal bx:  Esophagus, biopsy - ULCERATED BENIGN ESOPHAGEAL SQUAMOUS MUCOSA. - PAS STAIN IS NEGATIVE FOR FUNGAL ORGANISMS. - CMV, HSVI, AND HSVII STAINS ARE NEGATIVE. - NO INTESTINAL METAPLASIA, DYSPLASIA OR MALIGNANCY IDENTIFIED. - SEE COMMENT. Microscopic Comment Given the endoscopic finding of multiple ulcers throughout the esophagus, an infectious etiology should be strongly considered despite the negative fungal and viral stains. Another consideration especially in an HIV positive patient is so called idiopathic HIV associated ulceration (some HIV patients present with multiple discrete esophageal ulcers in the absence of an identifiable pathogen). Please correlate with clinical impression and appropriate laboratory studies. (RAH:eps 03/15/12) Zandra Abts MD Pathologist, Electronic Signature   Exam Spoke with pt and her Mom.  Did not reexamine.  She looks well.    Assessment 1.  Odynophagia.  EGD 3/28 with severe,  ulcerative esophagitis.  Biopsies not officially confirming infectious etiology but  suspicious for this or for HIV associated esophageal ulcerations.  Her odynophagia is resolved.   Current med mgt is Carafate, magic mouthwash, ganciclovir, pca fentanyl, IV Protonix 40 mg daily 2.  HIV 3.  S/P anterior mediastinotomy and biopsy of mediastinal mass 4/2.  Plan 1.  Continue Gancyclovir for now though consider changing to oral CMV coverage. Change to po Protonix, d/c Carafate and Magic mouthwash.  Regular diet.  2.  Will sign off, call if questions.

## 2012-03-17 NOTE — Progress Notes (Signed)
[facility] [place] TEAM 1 - Stepdown/ICU TEAM  Subjective: [name], originally from [place] (speaks and understands 50 - 75% English), newly diagnosed HIV/AIDS, CD 4 count of 20, VL pending, WB confirmation pending. Her mother is aware of her HIV status, but she prefers that no one else know. The patient has severe odynphagia, and via EGD was found to have severe ulcerative esophagitis (biopsies are pending). She was started on ganciclovir empirically for esophagitis.  Patient has on-going mild chest pain from esophagitis and large pulmonary/mediastinal mass of unknown etiology. She was taken for biopsy of the mediatinal mass via Peak procedure by TCTS 4/2.  All active issues being addressed by CVTS today.  Pt is being transferred to SDU.  Will f/u again in AM.  PROCEDURES:  3/28 - Upper Endoscopy with biopsies of the esophagus 4/02 - Left Chamberlain procedure (anterior mediastinotomy and  biopsy)  Objective: Weight change:   Intake/Output Summary (Last 24 hours) at 03/17/12 0817 Last data filed at 03/17/12 0600  Gross per 24 hour  Intake 2079.5 ml  Output   1877 ml  Net  202.5 ml   Blood pressure 115/74, pulse 95, temperature 98.6 F (37 C), temperature source Oral, resp. rate 21, height 5\' 5"  (1.651 m), weight 57.607 kg (127 lb), last menstrual period 02/21/2012, SpO2 100.00%.  CBG (last 3)   Basename 03/17/12 0802 03/16/12 2357 03/16/12 1205  GLUCAP 60* 86 77   Physical Exam: No exam today - active issues being addressed by CVTS at this time  Lab Results:  Basename 03/17/12 0423 03/16/12 0857 03/16/12 0611  NA 135 135 135  K 4.0 4.0 4.0  CL 100 100 101  CO2 26 24 23   GLUCOSE 89 83 86  BUN 5* 6 6  CREATININE 0.53 0.64 0.64  CALCIUM 9.2 9.7 9.3  MG -- -- --  PHOS -- -- --    Basename 03/16/12 0857  AST 19  ALT 13  ALKPHOS 84  BILITOT 0.2*  PROT 8.4*  ALBUMIN 3.5    Basename 03/17/12 0423 03/16/12 0857  WBC 2.4* 3.4*  NEUTROABS -- --    HGB 9.8* 10.8*  HCT 28.9* 32.6*  MCV 87.0 89.3  PLT 135* 183   Micro Results: Recent Results (from the past 240 hour(s))  HERPES SIMPLEX VIRUS CULTURE     Status: Normal (Preliminary result)   Collection Time   03/11/12  3:56 PM      Component Value Range Status Comment   Specimen Description ESOPHAGUS   Final    Special Requests NONE   Final    Culture Culture has been initiated.   Final    Report Status PENDING   Incomplete   CULTURE, BLOOD (ROUTINE X 2)     Status: Normal (Preliminary result)   Collection Time   03/12/12  6:10 AM      Component Value Range Status Comment   Specimen Description BLOOD RIGHT ARM   Final    Special Requests BOTTLES DRAWN AEROBIC AND ANAEROBIC 10CC EACH   Final    Culture  Setup Time [id number]   Final    Culture     Final    Value:        BLOOD CULTURE RECEIVED NO GROWTH TO DATE CULTURE WILL BE HELD FOR 5 DAYS BEFORE ISSUING A FINAL NEGATIVE REPORT   Report Status PENDING   Incomplete   CULTURE, BLOOD (ROUTINE X 2)     Status: Normal (Preliminary result)   Collection  Time   03/12/12  6:20 AM      Component Value Range Status Comment   Specimen Description BLOOD RIGHT WRIST   Final    Special Requests BOTTLES DRAWN AEROBIC AND ANAEROBIC 10CC EACH   Final    Culture  Setup Time [id number]   Final    Culture     Final    Value:        BLOOD CULTURE RECEIVED NO GROWTH TO DATE CULTURE WILL BE HELD FOR 5 DAYS BEFORE ISSUING A FINAL NEGATIVE REPORT   Report Status PENDING   Incomplete   SURGICAL PCR SCREEN     Status: Normal   Collection Time   03/12/12  1:06 PM      Component Value Range Status Comment   MRSA, PCR NEGATIVE  NEGATIVE  Final    Staphylococcus aureus NEGATIVE  NEGATIVE  Final   AFB CULTURE, BLOOD     Status: Normal (Preliminary result)   Collection Time   03/14/12  4:47 PM      Component Value Range Status Comment   Specimen Description BLOOD LEFT ARM   Final    Special Requests BLACK 5CC   Final    Culture     Final    Value:  CULTURE WILL BE EXAMINED FOR 6 WEEKS BEFORE ISSUING A FINAL REPORT   Report Status PENDING   Incomplete   SURGICAL PCR SCREEN     Status: Normal   Collection Time   03/16/12 12:20 PM      Component Value Range Status Comment   MRSA, PCR NEGATIVE  NEGATIVE  Final    Staphylococcus aureus NEGATIVE  NEGATIVE  Final     Studies/Results: All recent x-ray/radiology reports have been reviewed in detail.   Medications: I have reviewed the patient's complete medication list.  Assessment/Plan:  Lung/mediastinal mass Now s/p VATS bx - prelim path suggestive of lymphoma - await formal reading  Espohagitis/Odynophagia  ID is tx w/ GCV for empiric CMV coverage -   HIV+/AIDS CD4 count 20 - VL 200k - ID following and will guide initiation of ART  Normocytic anemia Due to above   Lonia Blood, MD [facility] Office  [phone] Pager [phone]  On-Call/Text Page:      [email or web address]      password [facility]

## 2012-03-17 NOTE — Progress Notes (Signed)
I agree with the above documentation, including the assessment and plan. Ulcerative esophagitis with bx non-diagnostic for viral esophagitis, thought she is high risk for HSV or CMV esophagitis. On antivirals, which we will defer to ID Agree with PO PPI. Awaiting results of mediastinal bx performed recently. Will sign off, call with questions.

## 2012-03-17 NOTE — Progress Notes (Signed)
1 Day Post-Op Procedure(s) (LRB): MEDIASTINOTOMY CHAMBERLAIN MCNEIL (Left) Subjective: Postop day 1 left Chamberlain procedure and biopsy of mediastinal mass Frozen section with inflammation possible lymphoma permanent sections pending Material also submitted for culture AFB and fungal Minimal chest tube drainage no air leak Chest x-ray clear Patient still complaining of anterior chest pain from incision Objective: Vital signs in last 24 hours: Temp:  [96.8 F (36 C)-98.7 F (37.1 C)] 98.6 F (37 C) (04/03 0805) Pulse Rate:  [83-95] 95  (04/03 0700) Cardiac Rhythm:  [-] Normal sinus rhythm (04/03 0400) Resp:  [9-23] 21  (04/03 0700) BP: (88-133)/(52-92) 115/74 mmHg (04/03 0700) SpO2:  [99 %-100 %] 100 % (04/03 0700) Weight:  [127 lb (57.607 kg)] 127 lb (57.607 kg) (04/03 0500)  Hemodynamic parameters for last 24 hours:   normal sinus rhythm  Intake/Output from previous day: 04/02 0701 - 04/03 0700 In: 2079.5 [P.O.:50; I.V.:1829.5; IV Piggyback:200] Out: 1877 [Urine:1605; Blood:50; Chest Tube:222] Intake/Output this shift:    Exam clear breath sounds sinus tachycardia no air leak   Lab Results:  Basename 03/17/12 0423 03/16/12 0857  WBC 2.4* 3.4*  HGB 9.8* 10.8*  HCT 28.9* 32.6*  PLT 135* 183   BMET:  Basename 03/17/12 0423 03/16/12 0857  NA 135 135  K 4.0 4.0  CL 100 100  CO2 26 24  GLUCOSE 89 83  BUN 5* 6  CREATININE 0.53 0.64  CALCIUM 9.2 9.7    PT/INR:  Basename 03/16/12 0857  LABPROT 13.8  INR 1.04   ABG    Component Value Date/Time   PHART 7.359 03/17/2012 0419   HCO3 26.0* 03/17/2012 0419   TCO2 27 03/17/2012 0419   ACIDBASEDEF 0.9 03/16/2012 0940   O2SAT 99.0 03/17/2012 0419   CBG (last 3)   Basename 03/17/12 0802 03/16/12 2357 03/16/12 1205  GLUCAP 60* 86 77    Assessment/Plan: S/P Procedure(s) (LRB): MEDIASTINOTOMY CHAMBERLAIN MCNEIL (Left) Transfer to step down unit 3300 Chest tube waterseal probably remove tomorrow Followup  pathology At portal for pain management continue PCA   LOS: 7 days    Kelsey Rodriguez,Kelsey Rodriguez 03/17/2012

## 2012-03-17 NOTE — Progress Notes (Signed)
Addendum to today's note:  We will likely start 4 drug TB treatment ( INH, RIF, PZa, and EMB) within the next 48hrs.  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 Drue Second MD MPH [facility] [phone]

## 2012-03-17 NOTE — Progress Notes (Signed)
ID PROGRESS NOTE [name], originally from [place], newly diagnosed HIV-1 /AIDS, CD 4 count of 20, VL 280,000,  found to have ulcerative esophagitis by EGD, and pulmonary/mediastinal mas of unknown etiology for esophagitis, on empiric ganciclovir, PPI and OI proph with bactrim, and azithromycin. She is POD#1 s/p Left Chamberlain procedure (anterior mediastinotomy and biopsy).   Subjective:  she reports having chest discomfort due to chest tube. She states her dysphagia is improved however she did not eat much since her surgery. She states she denies any fevers, but did have episode of nightsweats. No n/v/diarrhea  Addendum: [facility] lab reports specimen from shows + AFB  Abtx: GCV Day #6; bactrim DS daily, azithro Qwk     . acetaminophen  1,000 mg Intravenous Q6H  . azithromycin  1,200 mg Oral Weekly  . bisacodyl  10 mg Oral Daily  . cefUROXime (ZINACEF)  IV  1.5 g Intravenous Q12H  . fentaNYL      . fentaNYL   Intravenous Q4H  . HYDROmorphone      . ketorolac  15 mg Intravenous Q6H  . pantoprazole  40 mg Oral Q0600  . sulfamethoxazole-trimethoprim  20 mL Oral Daily  . tuberculin  5 Units Intradermal Once  . DISCONTD: cefUROXime (ZINACEF)  IV  1.5 g Intravenous 60 min Pre-Op  . DISCONTD: cefUROXime (ZINACEF)  IV  1.5 g Intravenous Q12H  . DISCONTD: cefUROXime  1.5 g Intramuscular Q12H  . DISCONTD: feeding supplement  237 mL Oral TID WC  . DISCONTD: fentaNYL  50 mcg Intravenous Once  . DISCONTD: ganciclovir (CYTOVENE) IV  5 mg/kg Intravenous Q12H  . DISCONTD: insulin aspart  0-24 Units Subcutaneous TID AC & HS  . DISCONTD: magic mouthwash w/lidocaine  5 mL Oral QID  . DISCONTD: pantoprazole (PROTONIX) IV  40 mg Intravenous Q12H  . DISCONTD: sodium chloride  3 mL Intravenous Q12H  . DISCONTD: sucralfate  1 g Oral TID WC & HS     Objective: Vital signs in last 24 hours: Temp:  [96.8 F (36 C)-98.8 F (37.1 C)] 98.8 F (37.1 C) (04/03 1200) Pulse Rate:  [83-99] 84  (04/03  1100) Resp:  [9-23] 15  (04/03 1100) BP: (88-133)/(52-92) 118/71 mmHg (04/03 1200) SpO2:  [100 %] 100 % (04/03 1100) Weight:  [127 lb (57.607 kg)] 127 lb (57.607 kg) (04/03 0500)   Total I/O In: 167.5 [I.V.:7.5; IV Piggyback:160] Out: 245 [Urine:225; Chest Tube:20] Gen= a x o by 4. In no acute distress. HEENT= PERRLA, EOMI, No scleral icterus. Oral pharynx is clear no thrush Neck = supple no lad Pulm= cTAB no w/c/r. She has left sided chest tube with seorsanguinous drainage.  Chest wall= bandage to anterior left chest wall from chamberlain procedure Cards= nl s1,s2, no g/m/r Abd= soft, decreased BS, no guarding nontender Ext= no c/c/e Skin = no rash  Lab Results  Basename 03/17/12 0423 03/16/12 0857  WBC 2.4* 3.4*  HGB 9.8* 10.8*  HCT 28.9* 32.6*  NA 135 135  K 4.0 4.0  CL 100 100  CO2 26 24  BUN 5* 6  CREATININE 0.53 0.64  GLU -- --   BMET    Component Value Date/Time   NA 135 03/17/2012 0423   K 4.0 03/17/2012 0423   CL 100 03/17/2012 0423   CO2 26 03/17/2012 0423   GLUCOSE 89 03/17/2012 0423   BUN 5* 03/17/2012 0423   CREATININE 0.53 03/17/2012 0423   CALCIUM 9.2 03/17/2012 0423   GFRNONAA >90 03/17/2012 0423  GFRAA >90 03/17/2012 0423     Microbiology: Recent Results (from the past 240 hour(s))  HERPES SIMPLEX VIRUS CULTURE     Status: Normal (Preliminary result)   Collection Time   03/11/12  3:56 PM      Component Value Range Status Comment   Specimen Description ESOPHAGUS   Final    Special Requests NONE   Final    Culture Culture has been initiated.   Final    Report Status PENDING   Incomplete   CULTURE, BLOOD (ROUTINE X 2)     Status: Normal (Preliminary result)   Collection Time   03/12/12  6:10 AM      Component Value Range Status Comment   Specimen Description BLOOD RIGHT ARM   Final    Special Requests BOTTLES DRAWN AEROBIC AND ANAEROBIC 10CC EACH   Final    Culture  Setup Time [id number]   Final    Culture     Final    Value:        BLOOD CULTURE  RECEIVED NO GROWTH TO DATE CULTURE WILL BE HELD FOR 5 DAYS BEFORE ISSUING A FINAL NEGATIVE REPORT   Report Status PENDING   Incomplete   CULTURE, BLOOD (ROUTINE X 2)     Status: Normal (Preliminary result)   Collection Time   03/12/12  6:20 AM      Component Value Range Status Comment   Specimen Description BLOOD RIGHT WRIST   Final    Special Requests BOTTLES DRAWN AEROBIC AND ANAEROBIC 10CC EACH   Final    Culture  Setup Time [id number]   Final    Culture     Final    Value:        BLOOD CULTURE RECEIVED NO GROWTH TO DATE CULTURE WILL BE HELD FOR 5 DAYS BEFORE ISSUING A FINAL NEGATIVE REPORT   Report Status PENDING   Incomplete   SURGICAL PCR SCREEN     Status: Normal   Collection Time   03/12/12  1:06 PM      Component Value Range Status Comment   MRSA, PCR NEGATIVE  NEGATIVE  Final    Staphylococcus aureus NEGATIVE  NEGATIVE  Final   AFB CULTURE, BLOOD     Status: Normal (Preliminary result)   Collection Time   03/14/12  4:47 PM      Component Value Range Status Comment   Specimen Description BLOOD LEFT ARM   Final    Special Requests BLACK 5CC   Final    Culture     Final    Value: CULTURE WILL BE EXAMINED FOR 6 WEEKS BEFORE ISSUING A FINAL REPORT   Report Status PENDING   Incomplete   SURGICAL PCR SCREEN     Status: Normal   Collection Time   03/16/12 12:20 PM      Component Value Range Status Comment   MRSA, PCR NEGATIVE  NEGATIVE  Final    Staphylococcus aureus NEGATIVE  NEGATIVE  Final    toxo titer = POSITIVE, 375.1 Cryptococcal antigen = NEGATIVE HepBsAg, HepBsAb = negative quantiferon POSITIVE  Studies/Results: 03/08/2012 chest CT: 1. Focal 3.1 x 2.7 x 2.7 cm mass abutting the aortic arch at the  periaortic region, with centrally decreased attenuation. It is  difficult to determine whether this arises from the mediastinum or  from the left upper lung lobe; surrounding atelectasis is noted,  with displacement of adjacent vasculature.  Given the patient's age and  its location, this may reflect a  thymoma,  germ cell tumor or less likely lymphoma. If this arises  from the lung, it could reflect an atypical infection such as  tuberculosis, though the lack of leukocytosis or additional lung  disease suggests against infection.  2. Scattered mediastinal nodes are otherwise borderline normal in  size.  3. Diffuse haziness within the mediastinum raises question for  mild diffuse associated edema; underlying infiltration of the  mediastinum cannot be excluded, given the patient's esophageal  symptoms.  Assessment/Plan: Pulmonary/mediastinal mass = preliminary results showing positive for AFB, suggestive for extra-pulmonary TB. Will place patient on  respiratory isolation. Specimen will be sent for PCR testing to differentiate TB vs. NTM. Will also try to do induce sputums to see if AFB from pulmonary source then can decide when to discontinue respiratory precautions.  -spoke with pathologist, who is doing AFB stains on specimen as well.  HIV/AIDS = we will treat for TB first before initiating ART.  In meantime, continue with OI prophylaxis with bactrim DS suspension daily and azithromycin 1200mg  suspension Qweek.  Esophagitis =biospy was non-diagnostic since stains for HSV and CMV were negative. Patient is starting to have some leukopenia due to GCV.   Await for CMV VL. In meantime, we will switch from GCV to valacyclovir (valtrex) 1gm TID for presumed HSV esophagitis. Continue with prn magic mouthwash.  Intermittent fevers = has been afebrile.   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 We will await Mtb PCR to decide regimen. 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 No prior history of TB exposure nor treatment per her report.  Positive toxo titers = currently on toxo proph with bactrim DS daily. No other signs or symptoms to suggest toxo encephalitis to consider need for treatment.  Drug absorption = please discontinue  carafate     LOS: 7 days    Kupono Marling 03/17/2012, 1:50 PM

## 2012-03-17 NOTE — Progress Notes (Signed)
Pt complained about new PICC line bleeding called and had IV team look at the new line, they said its bright red, they do that sometimes, try and leave it alone and allow to clot. If bleeding becomes heavier give them a call, if not after bleeding stops, either tonight or tomorrow, will be ok to change the dressing. Will continue to monitor the site.

## 2012-03-17 NOTE — Progress Notes (Signed)
Pt was t/x to 3302 on monitor via wheelchair with mother. Report called to [facility], Charity fundraiser. Pt was hooked up to telemetry before my departure and [name], RN was present during pt arrival  Kelsey Rodriguez

## 2012-03-18 ENCOUNTER — Inpatient Hospital Stay (HOSPITAL_COMMUNITY): Payer: Medicaid Other

## 2012-03-18 DIAGNOSIS — B20 Human immunodeficiency virus [HIV] disease: Secondary | ICD-10-CM

## 2012-03-18 LAB — HIV-1 GENOTYPR PLUS: Resistance Assoc RT Mutations: NOT DETECTED

## 2012-03-18 LAB — CULTURE, BLOOD (ROUTINE X 2)
Culture  Setup Time: 201303291130
Culture  Setup Time: 201303291130
Culture: NO GROWTH
Culture: NO GROWTH

## 2012-03-18 LAB — COMPREHENSIVE METABOLIC PANEL
ALT: 8 U/L (ref 0–35)
AST: 17 U/L (ref 0–37)
Albumin: 2.5 g/dL — ABNORMAL LOW (ref 3.5–5.2)
Alkaline Phosphatase: 65 U/L (ref 39–117)
BUN: 6 mg/dL (ref 6–23)
CO2: 23 mEq/L (ref 19–32)
Calcium: 8 mg/dL — ABNORMAL LOW (ref 8.4–10.5)
Chloride: 104 mEq/L (ref 96–112)
Creatinine, Ser: 0.41 mg/dL — ABNORMAL LOW (ref 0.50–1.10)
GFR calc Af Amer: >90 mL/min (ref >90)
GFR calc non Af Amer: >90 mL/min (ref >90)
Glucose, Bld: 83 mg/dL (ref 70–99)
Potassium: 3.3 mEq/L — ABNORMAL LOW (ref 3.5–5.1)
Sodium: 136 mEq/L (ref 135–145)
Total Bilirubin: 0.2 mg/dL — ABNORMAL LOW (ref 0.3–1.2)
Total Protein: 6.3 g/dL (ref 6.0–8.3)

## 2012-03-18 LAB — CBC
HCT: 25.2 % — ABNORMAL LOW (ref 36.0–46.0)
Hemoglobin: 8.6 g/dL — ABNORMAL LOW (ref 12.0–15.0)
MCH: 29.7 pg (ref 26.0–34.0)
MCHC: 34.1 g/dL (ref 30.0–36.0)
MCV: 86.9 fL (ref 78.0–100.0)
Platelets: 108 10*3/uL — ABNORMAL LOW (ref 150–400)
RBC: 2.9 MIL/uL — ABNORMAL LOW (ref 3.87–5.11)
RDW: 14.7 % (ref 11.5–15.5)
WBC: 5.1 10*3/uL (ref 4.0–10.5)

## 2012-03-18 MED ORDER — VITAMIN B-6 50 MG PO TABS
50.0000 mg | ORAL_TABLET | Freq: Every day | ORAL | Status: DC
  Administered 2012-03-19 – 2012-03-25 (×7): 50 mg via ORAL
  Filled 2012-03-18 (×7): qty 1

## 2012-03-18 MED ORDER — POTASSIUM CHLORIDE 10 MEQ/100ML IV SOLN
INTRAVENOUS | Status: AC
  Administered 2012-03-18: 10 meq
  Filled 2012-03-18: qty 100

## 2012-03-18 MED ORDER — SODIUM CHLORIDE 0.9 % IJ SOLN
INTRAMUSCULAR | Status: AC
  Filled 2012-03-18: qty 20

## 2012-03-18 MED ORDER — PYRAZINAMIDE 500 MG PO TABS
1500.0000 mg | ORAL_TABLET | Freq: Every day | ORAL | Status: DC
  Administered 2012-03-18 – 2012-03-25 (×8): 1500 mg via ORAL
  Filled 2012-03-18 (×8): qty 3

## 2012-03-18 MED ORDER — VITAMIN B-6 100 MG PO TABS
200.0000 mg | ORAL_TABLET | Freq: Every day | ORAL | Status: DC
  Administered 2012-03-18: 200 mg via ORAL
  Filled 2012-03-18: qty 2

## 2012-03-18 MED ORDER — ETHAMBUTOL HCL 400 MG PO TABS
1000.0000 mg | ORAL_TABLET | Freq: Every day | ORAL | Status: DC
  Administered 2012-03-18 – 2012-03-25 (×8): 1000 mg via ORAL
  Filled 2012-03-18 (×8): qty 2

## 2012-03-18 MED ORDER — ISONIAZID 300 MG PO TABS
300.0000 mg | ORAL_TABLET | Freq: Every day | ORAL | Status: DC
  Administered 2012-03-18 – 2012-03-25 (×8): 300 mg via ORAL
  Filled 2012-03-18 (×8): qty 1

## 2012-03-18 MED ORDER — DIAZEPAM 5 MG PO TABS
ORAL_TABLET | ORAL | Status: AC
  Filled 2012-03-18: qty 1

## 2012-03-18 MED ORDER — PROMETHAZINE HCL 25 MG/ML IJ SOLN
12.5000 mg | Freq: Four times a day (QID) | INTRAMUSCULAR | Status: DC | PRN
  Administered 2012-03-18: 25 mg via INTRAVENOUS
  Filled 2012-03-18: qty 1

## 2012-03-18 MED ORDER — RIFABUTIN 150 MG PO CAPS
300.0000 mg | ORAL_CAPSULE | Freq: Every day | ORAL | Status: DC
  Administered 2012-03-18 – 2012-03-25 (×8): 300 mg via ORAL
  Filled 2012-03-18 (×8): qty 2

## 2012-03-18 NOTE — Progress Notes (Signed)
[facility] [place] TEAM 1 - Stepdown/ICU TEAM  Subjective: [name], originally from [place] (speaks and understands 50 - 75% English), newly diagnosed HIV/AIDS, CD 4 count of 20, VL pending, WB confirmation pending. Her mother is aware of her HIV status, but she prefers that no one else know. The patient has severe odynphagia, and via EGD was found to have severe ulcerative esophagitis (biopsies are pending). She was started on ganciclovir empirically for esophagitis.  Patient has on-going mild chest pain from esophagitis and large pulmonary/mediastinal mass of unknown etiology. She was taken for biopsy of the mediatinal mass via [place] procedure by [facility] 4/2.   She has no complains other than pain in arm where she is receiving IV KCL. She is eating and drinking without and pain. No chest pain or dyspnea currently. She is asking for a note for her job stating that she is hospitalized.   PROCEDURES:  3/28 - Upper Endoscopy with biopsies of the esophagus 4/02 - Left Chamberlain procedure (anterior mediastinotomy and  biopsy)  Objective: Weight change:   Intake/Output Summary (Last 24 hours) at 03/18/12 1821 Last data filed at 03/18/12 1200  Gross per 24 hour  Intake      0 ml  Output   1710 ml  Net  -1710 ml   Blood pressure 102/67, pulse 114, temperature 98.7 F (37.1 C), temperature source Oral, resp. rate 29, height 5\' 5"  (1.651 m), weight 57.607 kg (127 lb), last menstrual period 02/21/2012, SpO2 96.00%.  CBG (last 3)   Basename 03/17/12 0918 03/17/12 0802 03/16/12 2357  GLUCAP 104* 60* 86   Physical Exam: No exam today - active issues being addressed by CVTS at this time  Lab Results:  Basename 03/18/12 0430 03/17/12 0423 03/16/12 0857  NA 136 135 135  K 3.3* 4.0 4.0  CL 104 100 100  CO2 23 26 24   GLUCOSE 83 89 83  BUN 6 5* 6  CREATININE 0.41* 0.53 0.64  CALCIUM 8.0* 9.2 9.7  MG -- -- --  PHOS -- -- --    Basename 03/18/12 0430 03/16/12 0857  AST  17 19  ALT 8 13  ALKPHOS 65 84  BILITOT 0.2* 0.2*  PROT 6.3 8.4*  ALBUMIN 2.5* 3.5    Basename 03/18/12 0430 03/17/12 0423 03/16/12 0857  WBC 5.1 2.4* 3.4*  NEUTROABS -- 1.7 --  HGB 8.6* 9.8* 10.8*  HCT 25.2* 28.9* 32.6*  MCV 86.9 87.0 89.3  PLT 108* 135* 183   Micro Results: Recent Results (from the past 240 hour(s))  HERPES SIMPLEX VIRUS CULTURE     Status: Normal   Collection Time   03/11/12  3:56 PM      Component Value Range Status Comment   Specimen Description ESOPHAGUS   Final    Special Requests NONE   Final    Culture Herpes Simplex Type 1 detected.   Final    Report Status 03/17/2012 FINAL   Final   CULTURE, BLOOD (ROUTINE X 2)     Status: Normal   Collection Time   03/12/12  6:10 AM      Component Value Range Status Comment   Specimen Description BLOOD RIGHT ARM   Final    Special Requests BOTTLES DRAWN AEROBIC AND ANAEROBIC [facility]   Final    Culture  Setup Time [id number]   Final    Culture NO GROWTH 5 DAYS   Final    Report Status 03/18/2012 FINAL   Final   CULTURE,  BLOOD (ROUTINE X 2)     Status: Normal   Collection Time   03/12/12  6:20 AM      Component Value Range Status Comment   Specimen Description BLOOD RIGHT WRIST   Final    Special Requests BOTTLES DRAWN AEROBIC AND ANAEROBIC [facility]   Final    Culture  Setup Time [id number]   Final    Culture NO GROWTH 5 DAYS   Final    Report Status 03/18/2012 FINAL   Final   SURGICAL PCR SCREEN     Status: Normal   Collection Time   03/12/12  1:06 PM      Component Value Range Status Comment   MRSA, PCR NEGATIVE  NEGATIVE  Final    Staphylococcus aureus NEGATIVE  NEGATIVE  Final   AFB CULTURE, BLOOD     Status: Normal (Preliminary result)   Collection Time   03/14/12  4:47 PM      Component Value Range Status Comment   Specimen Description BLOOD LEFT ARM   Final    Special Requests BLACK 5CC   Final    Culture     Final    Value: CULTURE WILL BE EXAMINED FOR 6 WEEKS BEFORE ISSUING A FINAL REPORT     Report Status PENDING   Incomplete   SURGICAL PCR SCREEN     Status: Normal   Collection Time   03/16/12 12:20 PM      Component Value Range Status Comment   MRSA, PCR NEGATIVE  NEGATIVE  Final    Staphylococcus aureus NEGATIVE  NEGATIVE  Final   AFB CULTURE WITH SMEAR     Status: Normal (Preliminary result)   Collection Time   03/16/12  5:24 PM      Component Value Range Status Comment   Specimen Description TISSUE   Final    Special Requests     Final    Value: PT ON ZINACEF,AZITHROMYCIN,CYTOVENE,SEPTRA MEDIASTINAL MASS   ACID FAST SMEAR     Final    Value: 4+ ACID FAST BACILLI SEEN CRITICAL RESULT CALLED TO, READ BACK BY AND VERIFIED WITH: DR Jerolyn Center 14:15 03/17/12 GF   Culture     Final    Value: CULTURE WILL BE EXAMINED FOR 6 WEEKS BEFORE ISSUING A FINAL REPORT   Report Status PENDING   Incomplete   FUNGUS CULTURE W SMEAR     Status: Normal (Preliminary result)   Collection Time   03/16/12  5:24 PM      Component Value Range Status Comment   Specimen Description TISSUE   Final    Special Requests     Final    Value: PT ON ZINACEF,AZITHROMYCIN,CYTOVENE,SEPTRA MEDIASTINAL MASS   Fungal Smear NO YEAST OR FUNGAL ELEMENTS SEEN   Final    Culture CULTURE IN PROGRESS FOR FOUR WEEKS   Final    Report Status PENDING   Incomplete     Studies/Results: All recent x-ray/radiology reports have been reviewed in detail.   Medications: I have reviewed the patient's complete medication list.  Assessment/Plan:  Lung/mediastinal mass Now s/p VATS bx - Path consistent with TB. Started on meds per ID today  Espohagitis/Odynophagia  HSV culture positive. On Valacyclovir Eating/drinking w/o pain now.   Leukopenia From GCV- resolving  Thrombocytopenia Follow closely, getting worse; I do not see any Heparin products were given May be a reaction to ID meds.   Positive titers for Toxoplasma without acute infection Bactrim for prophylaxis per ID  HIV+/AIDS CD4  count 20 - VL 200k -  ID following and will guide initiation of ART  Normocytic anemia Due to above   Calvert Cantor, MD [facility] Office  [phone] Pager [phone]  On-Call/Text Page:      [email or web address]      password [facility]

## 2012-03-18 NOTE — Progress Notes (Addendum)
Subjective:   [name] complains of nausea and vomiting this morning.  Objective:  Vital Signs in the last 24 hours: Temp:  [97.4 F (36.3 C)-100.1 F (37.8 C)] 100.1 F (37.8 C) (04/04 0400) Pulse Rate:  [84-114] 114  (04/04 0400) Resp:  [14-29] 29  (04/04 0400) BP: (101-118)/(61-71) 107/66 mmHg (04/04 0400) SpO2:  [96 %-100 %] 96 % (04/04 0400)  Intake/Output from previous day: 04/03 0701 - 04/04 0700 In: 647.5 [P.O.:480; I.V.:7.5; IV Piggyback:160] Out: 1715 [Urine:1625; Chest Tube:90] Intake/Output from this shift:    Physical Exam: General appearance: alert and no distress Lungs: clear to auscultation bilaterally Heart: regular rate and rhythm, S1, S2 normal, no murmur, click, rub or gallop Abdomen: soft, non-tender; bowel sounds normal; no masses,  no organomegaly Extremities: extremities normal, atraumatic, no cyanosis or edema Skin: chest tube site clean and dry, incision C/D/I  Lab Results:  Basename 03/18/12 0430 03/17/12 0423  WBC 5.1 2.4*  HGB 8.6* 9.8*  PLT 108* 135*    Basename 03/18/12 0430 03/17/12 0423  NA 136 135  K 3.3* 4.0  CL 104 100  CO2 23 26  GLUCOSE 83 89  BUN 6 5*  CREATININE 0.41* 0.53   No results found for this basename: TROPONINI:2,CK,MB:2 in the last 72 hours Hepatic Function Panel  Basename 03/18/12 0430  PROT 6.3  ALBUMIN 2.5*  AST 17  ALT 8  ALKPHOS 65  BILITOT 0.2*  BILIDIR --  IBILI --   No results found for this basename: CHOL in the last 72 hours No results found for this basename: PROTIME in the last 72 hours  Imaging: Imaging results have been reviewed: no evidence of pneumothorax, chest tube remains in place on the left, atelectasis bilaterally  Assessment/Plan:   1. S/P Mediastinotomy Chamberlain procedure on left- final pathology pending to rule out lymphoma, AFB revealed positive organisms on gram stain, culture pending, Fungal culture negative 2. Will D/C chest tube today 3. Nausea and Vomiting- patient  on Zofran with no relief.  Will add phenergan prn 4. Pain control- will leave PCA, if patient feels better tomorrow can likely d/c 5. Will follow   LOS: 8 days    BARRETT, ERIN 03/18/2012, 9:30 AM    patient examined and medical record reviewed,agree with above note. VAN TRIGT III,Jenisse Vullo 03/18/2012

## 2012-03-18 NOTE — Progress Notes (Signed)
Utilization Review Completed.Kelsey Rodriguez T4/03/2012   

## 2012-03-18 NOTE — Progress Notes (Signed)
ID PROGRESS NOTE [name], originally from [place], newly diagnosed HIV-1 /AIDS, CD 4 count of 20, VL 280,000,  on OI proph  onlywith bactrim, and azithromycin.found to have ulcerative esophagitis by EGD, and pulmonary/mediastinal mass She is POD#2 s/p Left Chamberlain procedure (anterior mediastinotomy and biopsy).   Subjective:  had chest tube removed this morning, feeling better without it. Pain under good control. Placed into airborne precautions due to 4+ AFB on smear from mediastinal biopsy. She denies fever.chills.ns. Cough. Did have 1 BM yesterday. Doing well today  Abtx:  GCV Day stopped 4/3 valcyclovir started 4/3- present; bactrim DS daily,  azithro Qwk     . acetaminophen  1,000 mg Intravenous Q6H  . azithromycin  1,200 mg Oral Weekly  . bisacodyl  10 mg Oral Daily  . cefUROXime (ZINACEF)  IV  1.5 g Intravenous Q12H  . ethambutol  1,000 mg Oral Daily  . fentaNYL   Intravenous Q4H  . isoniazid  300 mg Oral Daily  . ketorolac  15 mg Intravenous Q6H  . pantoprazole  40 mg Oral Q0600  . potassium chloride      . pyrazinamide  1,500 mg Oral Daily  . pyridOXINE  200 mg Oral Daily  . rifabutin  300 mg Oral Daily  . sodium chloride  10-40 mL Intracatheter Q12H  . sulfamethoxazole-trimethoprim  20 mL Oral Daily  . valACYclovir  1,000 mg Oral BID     Objective: Vital signs in last 24 hours: Temp:  [97.4 F (36.3 C)-100.1 F (37.8 C)] 100.1 F (37.8 C) (04/04 0400) Pulse Rate:  [94-114] 114  (04/04 0400) Resp:  [14-29] 29  (04/04 0400) BP: (107-114)/(65-71) 107/66 mmHg (04/04 0400) SpO2:  [96 %-100 %] 96 % (04/04 0400)     Gen= a x o by 4. In no acute distress. HEENT= PERRLA, EOMI, No scleral icterus. Oral pharynx is clear no thrush Neck = supple no lad Pulm= cTAB no w/c/r.  Chest wall= bandage to anterior left chest wall from chamberlain procedure. Serous drainage on bandage from chest tube insertion site. Chest tube removed Cards= nl s1,s2, no g/m/r Abd=  soft, decreased BS, no guarding nontender Ext= no c/c/e Skin = no rash  Lab Results  Basename 03/18/12 0430 03/17/12 0423  WBC 5.1 2.4*  HGB 8.6* 9.8*  HCT 25.2* 28.9*  NA 136 135  K 3.3* 4.0  CL 104 100  CO2 23 26  BUN 6 5*  CREATININE 0.41* 0.53  GLU -- --   BMET    Component Value Date/Time   NA 136 03/18/2012 0430   K 3.3* 03/18/2012 0430   CL 104 03/18/2012 0430   CO2 23 03/18/2012 0430   GLUCOSE 83 03/18/2012 0430   BUN 6 03/18/2012 0430   CREATININE 0.41* 03/18/2012 0430   CALCIUM 8.0* 03/18/2012 0430   GFRNONAA >90 03/18/2012 0430   GFRAA >90 03/18/2012 0430     Microbiology: Recent Results (from the past 240 hour(s))  HERPES SIMPLEX VIRUS CULTURE     Status: Normal   Collection Time   03/11/12  3:56 PM      Component Value Range Status Comment   Specimen Description ESOPHAGUS   Final    Special Requests NONE   Final    Culture Herpes Simplex Type 1 detected.   Final    Report Status 03/17/2012 FINAL   Final   CULTURE, BLOOD (ROUTINE X 2)     Status: Normal   Collection Time   03/12/12  6:10  AM      Component Value Range Status Comment   Specimen Description BLOOD RIGHT ARM   Final    Special Requests BOTTLES DRAWN AEROBIC AND ANAEROBIC [facility]   Final    Culture  Setup Time [id number]   Final    Culture NO GROWTH 5 DAYS   Final    Report Status 03/18/2012 FINAL   Final   CULTURE, BLOOD (ROUTINE X 2)     Status: Normal   Collection Time   03/12/12  6:20 AM      Component Value Range Status Comment   Specimen Description BLOOD RIGHT WRIST   Final    Special Requests BOTTLES DRAWN AEROBIC AND ANAEROBIC [facility]   Final    Culture  Setup Time [id number]   Final    Culture NO GROWTH 5 DAYS   Final    Report Status 03/18/2012 FINAL   Final   SURGICAL PCR SCREEN     Status: Normal   Collection Time   03/12/12  1:06 PM      Component Value Range Status Comment   MRSA, PCR NEGATIVE  NEGATIVE  Final    Staphylococcus aureus NEGATIVE  NEGATIVE  Final   AFB CULTURE,  BLOOD     Status: Normal (Preliminary result)   Collection Time   03/14/12  4:47 PM      Component Value Range Status Comment   Specimen Description BLOOD LEFT ARM   Final    Special Requests BLACK 5CC   Final    Culture     Final    Value: CULTURE WILL BE EXAMINED FOR 6 WEEKS BEFORE ISSUING A FINAL REPORT   Report Status PENDING   Incomplete   SURGICAL PCR SCREEN     Status: Normal   Collection Time   03/16/12 12:20 PM      Component Value Range Status Comment   MRSA, PCR NEGATIVE  NEGATIVE  Final    Staphylococcus aureus NEGATIVE  NEGATIVE  Final   AFB CULTURE WITH SMEAR     Status: Normal (Preliminary result)   Collection Time   03/16/12  5:24 PM      Component Value Range Status Comment   Specimen Description TISSUE   Final    Special Requests     Final    Value: PT ON ZINACEF,AZITHROMYCIN,CYTOVENE,SEPTRA MEDIASTINAL MASS   ACID FAST SMEAR     Final    Value: 4+ ACID FAST BACILLI SEEN CRITICAL RESULT CALLED TO, READ BACK BY AND VERIFIED WITH: DR Jerolyn Center 14:15 03/17/12 GF   Culture     Final    Value: CULTURE WILL BE EXAMINED FOR 6 WEEKS BEFORE ISSUING A FINAL REPORT   Report Status PENDING   Incomplete   FUNGUS CULTURE W SMEAR     Status: Normal (Preliminary result)   Collection Time   03/16/12  5:24 PM      Component Value Range Status Comment   Specimen Description TISSUE   Final    Special Requests     Final    Value: PT ON ZINACEF,AZITHROMYCIN,CYTOVENE,SEPTRA MEDIASTINAL MASS   Fungal Smear NO YEAST OR FUNGAL ELEMENTS SEEN   Final    Culture CULTURE IN PROGRESS FOR FOUR WEEKS   Final    Report Status PENDING   Incomplete    toxo titer = POSITIVE, 375.1 Cryptococcal antigen = NEGATIVE HepBsAg, HepBsAb = negative quantiferon POSITIVE  Studies/Results: 03/08/2012 chest CT: 1. Focal 3.1 x 2.7 x 2.7 cm mass  abutting the aortic arch at the  periaortic region, with centrally decreased attenuation. It is  difficult to determine whether this arises from the mediastinum or    from the left upper lung lobe; surrounding atelectasis is noted,  with displacement of adjacent vasculature.  Given the patient's age and its location, this may reflect a  thymoma, germ cell tumor or less likely lymphoma. If this arises  from the lung, it could reflect an atypical infection such as  tuberculosis, though the lack of leukocytosis or additional lung  disease suggests against infection.  2. Scattered mediastinal nodes are otherwise borderline normal in  size.  3. Diffuse haziness within the mediastinum raises question for  mild diffuse associated edema; underlying infiltration of the  mediastinum cannot be excluded, given the patient's esophageal  symptoms.  Assessment/Plan: Pulmonary/mediastinal mass = preliminary results showing positive for AFB, suggestive for extra-pulmonary TB. Will place patient on  respiratory isolation. Specimen will be sent for PCR testing to differentiate TB vs. NTM. Will also try to do induce sputums to see if AFB from pulmonary source then can decide when to discontinue respiratory precautions.   HIV/AIDS = we will treat for TB first before initiating ART, likely in 10-14 days.  In meantime, continue with OI prophylaxis with bactrim DS suspension daily and azithromycin 1200mg  suspension Qweek. Will likely do ral/truvada regimen since that would have less drug interaction with TB regimen.  HSV Esophagitis =biospy was non-diagnostic since stains for HSV and CMV were negative. But HSV cx from biopsy is positive. Patient is recovering from leukopenia due to GCV.   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 Continue with prn magic mouthwash.  Intermittent fevers = has been afebrile.    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 AFB on smear from mediastinal bx as well as on path. await Mtb PCR . Will start RIPE treatment. Rifabutin 300mg  daily, inh 300mg  daily, PZA 1500mg  daily, ethambutol 1000mg  daily, and vit b6 200mg   daily. We will make sure [facility] sends specimen for drug resistance/sensitivity testing.   --Will also have patient due 2 induced sputums in order to decide if can remove from airborne precautions.  Positive toxo titers = currently on toxo proph with bactrim DS daily. No other signs or symptoms to suggest toxo encephalitis to consider need for treatment.  Drug absorption = please discontinue carafate  Anemia= thought to be due to blood loss from recent procedure, please continue to check CBC may need RBC transfusion  Iron def anemia = may benefit from iron infusion  Thrombocytopenia = will look at meds to see if that is causing 50% drop in plt. Not currently on heparin.  dispo = clinic is applying for ADAP so that patient can get HIV meds for free.     LOS: 8 days    Sallie Maker 03/18/2012, 12:30 PM

## 2012-03-19 ENCOUNTER — Inpatient Hospital Stay (HOSPITAL_COMMUNITY): Payer: Medicaid Other

## 2012-03-19 LAB — CMV ANTIBODY, IGG (EIA): CMV Ab - IgG: >6.5 — ABNORMAL HIGH (ref <0.90)

## 2012-03-19 LAB — TYPE AND SCREEN
ABO/RH(D): O POS
Antibody Screen: NEGATIVE
Unit division: 0
Unit division: 0

## 2012-03-19 LAB — CBC
HCT: 27 % — ABNORMAL LOW (ref 36.0–46.0)
Hemoglobin: 9.2 g/dL — ABNORMAL LOW (ref 12.0–15.0)
MCH: 29.5 pg (ref 26.0–34.0)
MCHC: 34.1 g/dL (ref 30.0–36.0)
MCV: 86.5 fL (ref 78.0–100.0)
Platelets: 139 10*3/uL — ABNORMAL LOW (ref 150–400)
RBC: 3.12 MIL/uL — ABNORMAL LOW (ref 3.87–5.11)
RDW: 14.9 % (ref 11.5–15.5)
WBC: 5.3 10*3/uL (ref 4.0–10.5)

## 2012-03-19 LAB — URINALYSIS, ROUTINE W REFLEX MICROSCOPIC
Bilirubin Urine: NEGATIVE
Glucose, UA: NEGATIVE mg/dL
Hgb urine dipstick: NEGATIVE
Ketones, ur: 15 mg/dL — AB
Nitrite: NEGATIVE
Protein, ur: NEGATIVE mg/dL
Specific Gravity, Urine: 1.023 (ref 1.005–1.030)
Urobilinogen, UA: 1 mg/dL (ref 0.0–1.0)
pH: 7 (ref 5.0–8.0)

## 2012-03-19 LAB — URINE MICROSCOPIC-ADD ON

## 2012-03-19 LAB — BASIC METABOLIC PANEL
BUN: 8 mg/dL (ref 6–23)
CO2: 25 mEq/L (ref 19–32)
Calcium: 8.5 mg/dL (ref 8.4–10.5)
Chloride: 104 mEq/L (ref 96–112)
Creatinine, Ser: 0.44 mg/dL — ABNORMAL LOW (ref 0.50–1.10)
GFR calc Af Amer: >90 mL/min (ref >90)
GFR calc non Af Amer: >90 mL/min (ref >90)
Glucose, Bld: 122 mg/dL — ABNORMAL HIGH (ref 70–99)
Potassium: 3.8 mEq/L (ref 3.5–5.1)
Sodium: 137 mEq/L (ref 135–145)

## 2012-03-19 LAB — DIFFERENTIAL
Basophils Absolute: 0 10*3/uL (ref 0.0–0.1)
Basophils Relative: 0 % (ref 0–1)
Eosinophils Absolute: 0.2 10*3/uL (ref 0.0–0.7)
Eosinophils Relative: 3 % (ref 0–5)
Lymphocytes Relative: 14 % (ref 12–46)
Lymphs Abs: 0.7 10*3/uL (ref 0.7–4.0)
Monocytes Absolute: 0.4 10*3/uL (ref 0.1–1.0)
Monocytes Relative: 7 % (ref 3–12)
Neutro Abs: 4 10*3/uL (ref 1.7–7.7)
Neutrophils Relative %: 76 % (ref 43–77)

## 2012-03-19 LAB — CMV IGM: CMV IgM: 0.08 (ref <0.90)

## 2012-03-19 MED ORDER — OXYCODONE HCL 5 MG PO TABS
5.0000 mg | ORAL_TABLET | ORAL | Status: DC | PRN
  Administered 2012-03-19 – 2012-03-22 (×8): 10 mg via ORAL
  Filled 2012-03-19: qty 1
  Filled 2012-03-19 (×8): qty 2

## 2012-03-19 MED ORDER — MORPHINE SULFATE 2 MG/ML IJ SOLN: 1.0000 mg | INTRAMUSCULAR | Status: DC | PRN

## 2012-03-19 MED ORDER — ALTEPLASE 2 MG IJ SOLR
2.0000 mg | Freq: Once | INTRAMUSCULAR | Status: DC
  Filled 2012-03-19: qty 2

## 2012-03-19 MED ORDER — ONDANSETRON HCL 4 MG/2ML IJ SOLN
4.0000 mg | Freq: Four times a day (QID) | INTRAMUSCULAR | Status: DC | PRN
  Administered 2012-03-20: 4 mg via INTRAVENOUS
  Filled 2012-03-19: qty 2

## 2012-03-19 MED ORDER — POTASSIUM CHLORIDE CRYS ER 20 MEQ PO TBCR
40.0000 meq | EXTENDED_RELEASE_TABLET | Freq: Two times a day (BID) | ORAL | Status: AC
  Administered 2012-03-19 – 2012-03-20 (×3): 40 meq via ORAL
  Filled 2012-03-19 (×3): qty 2

## 2012-03-19 NOTE — Progress Notes (Signed)
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   Subjective: She had fever last night to 101.11F but not infectious work-up conducted at that time. She responded to having tylenol. This morning she reports being fatigue, occ pain. Eating well. Tolerating taking TB medications. dsyphagia improved.  Had discussion with patient and her mother regarding TB diagnosis. The patient states that she was underwent blood test for TB 2010 that was negative, although she states that during skin testing she was negative, while rest of her family was positive (mother, 2 brothers)  Her mother reports that she took 4 months of treatment, but Abbigail did not since skin test was negative. She is concerned to find out if Gatha is infectious. Secondly, she is anxious to get her daughter home and wonders if she can be cared for at home.  Abtx:  GCV Day stopped 4/3  bactrim DS daily,  azithro Qwk valcyclovir   4/3- present; INH-    4/4 - present pza -  4/4 - present Ethambutol - 4/4 - present rifampin 4/4 - present   . alteplase  2 mg Intracatheter Once  . azithromycin  1,200 mg Oral Weekly  . bisacodyl  10 mg Oral Daily  . cefUROXime (ZINACEF)  IV  1.5 g Intravenous Q12H  . diazepam      . ethambutol  1,000 mg Oral Daily  . isoniazid  300 mg Oral Daily  . ketorolac  15 mg Intravenous Q6H  . pantoprazole  40 mg Oral Q0600  . pyrazinamide  1,500 mg Oral Daily  . pyridOXINE  50 mg Oral Daily  . rifabutin  300 mg Oral Daily  . sodium chloride  10-40 mL Intracatheter Q12H  . sodium chloride      . sulfamethoxazole-trimethoprim  20 mL Oral Daily  . valACYclovir  1,000 mg Oral BID  . DISCONTD: fentaNYL   Intravenous Q4H  . DISCONTD:  pyridOXINE  200 mg Oral Daily     Objective: Vital signs in last 24 hours: Temp:  [97.9 F (36.6 C)-101.1 F (38.4 C)] 100.8 F (38.2 C) (04/05 1200) Pulse Rate:  [102-116] 116  (04/05 1200) Resp:  [14-25] 23  (04/05 1200) BP: (97-116)/(49-67) 116/66 mmHg (04/05 1200) SpO2:  [98 %-100 %] 98 % (04/05 1200)   Total I/O In: 580 [P.O.:480; I.V.:100] Out: -  Gen= a x o by 4. In no acute distress. Warm to touch. Not as bright as yesterday. HEENT= PERRLA, EOMI, No scleral icterus. Oral pharynx is clear no thrush Neck = supple no lad Pulm= cTAB no w/c/r.  Chest wall= bandage to anterior left chest wall from chamberlain procedure. Serous drainage on bandage from chest tube insertion site. Chest tube removed Cards= nl s1,s2, no g/m/r Abd= soft, decreased BS, no guarding nontender Ext= no c/c/e Skin = no rash  Lab Results  Basename 03/18/12 0430 03/17/12 0423  WBC 5.1 2.4*  HGB 8.6* 9.8*  HCT 25.2* 28.9*  NA 136 135  K 3.3* 4.0  CL 104 100  CO2 23 26  BUN 6 5*  CREATININE 0.41* 0.53  GLU -- --   BMET    Component Value Date/Time   NA 136 03/18/2012 0430   K 3.3* 03/18/2012 0430   CL 104 03/18/2012 0430  CO2 23 03/18/2012 0430   GLUCOSE 83 03/18/2012 0430   BUN 6 03/18/2012 0430   CREATININE 0.41* 03/18/2012 0430   CALCIUM 8.0* 03/18/2012 0430   GFRNONAA >90 03/18/2012 0430   GFRAA >90 03/18/2012 0430     Microbiology: Recent Results (from the past 240 hour(s))  HERPES SIMPLEX VIRUS CULTURE     Status: Normal   Collection Time   03/11/12  3:56 PM      Component Value Range Status Comment   Specimen Description ESOPHAGUS   Final    Special Requests NONE   Final    Culture Herpes Simplex Type 1 detected.   Final    Report Status 03/17/2012 FINAL   Final   CULTURE, BLOOD (ROUTINE X 2)     Status: Normal   Collection Time   03/12/12  6:10 AM      Component Value Range Status Comment   Specimen Description BLOOD RIGHT ARM   Final    Special Requests BOTTLES DRAWN AEROBIC AND  ANAEROBIC [facility]   Final    Culture  Setup Time [id number]   Final    Culture NO GROWTH 5 DAYS   Final    Report Status 03/18/2012 FINAL   Final   CULTURE, BLOOD (ROUTINE X 2)     Status: Normal   Collection Time   03/12/12  6:20 AM      Component Value Range Status Comment   Specimen Description BLOOD RIGHT WRIST   Final    Special Requests BOTTLES DRAWN AEROBIC AND ANAEROBIC [facility]   Final    Culture  Setup Time [id number]   Final    Culture NO GROWTH 5 DAYS   Final    Report Status 03/18/2012 FINAL   Final   SURGICAL PCR SCREEN     Status: Normal   Collection Time   03/12/12  1:06 PM      Component Value Range Status Comment   MRSA, PCR NEGATIVE  NEGATIVE  Final    Staphylococcus aureus NEGATIVE  NEGATIVE  Final   AFB CULTURE, BLOOD     Status: Normal (Preliminary result)   Collection Time   03/14/12  4:47 PM      Component Value Range Status Comment   Specimen Description BLOOD LEFT ARM   Final    Special Requests BLACK 5CC   Final    Culture     Final    Value: CULTURE WILL BE EXAMINED FOR 6 WEEKS BEFORE ISSUING A FINAL REPORT   Report Status PENDING   Incomplete   SURGICAL PCR SCREEN     Status: Normal   Collection Time   03/16/12 12:20 PM      Component Value Range Status Comment   MRSA, PCR NEGATIVE  NEGATIVE  Final    Staphylococcus aureus NEGATIVE  NEGATIVE  Final   AFB CULTURE WITH SMEAR     Status: Normal (Preliminary result)   Collection Time   03/16/12  5:24 PM      Component Value Range Status Comment   Specimen Description TISSUE   Final    Special Requests     Final    Value: PT ON ZINACEF,AZITHROMYCIN,CYTOVENE,SEPTRA MEDIASTINAL MASS   ACID FAST SMEAR     Final    Value: 4+ ACID FAST BACILLI SEEN CRITICAL RESULT CALLED TO, READ BACK BY AND VERIFIED WITH: DR Jerolyn Center 14:15 03/17/12 GF   Culture     Final    Value: CULTURE WILL BE EXAMINED  FOR 6 WEEKS BEFORE ISSUING A FINAL REPORT   Report Status PENDING   Incomplete   FUNGUS CULTURE W SMEAR      Status: Normal (Preliminary result)   Collection Time   03/16/12  5:24 PM      Component Value Range Status Comment   Specimen Description TISSUE   Final    Special Requests     Final    Value: PT ON ZINACEF,AZITHROMYCIN,CYTOVENE,SEPTRA MEDIASTINAL MASS   Fungal Smear NO YEAST OR FUNGAL ELEMENTS SEEN   Final    Culture CULTURE IN PROGRESS FOR FOUR WEEKS   Final    Report Status PENDING   Incomplete    toxo titer = POSITIVE, 375.1 Cryptococcal antigen = NEGATIVE HepBsAg, HepBsAb = negative quantiferon POSITIVE  Studies/Results: 03/08/2012 chest CT: 1. Focal 3.1 x 2.7 x 2.7 cm mass abutting the aortic arch at the  periaortic region, with centrally decreased attenuation. It is  difficult to determine whether this arises from the mediastinum or  from the left upper lung lobe; surrounding atelectasis is noted,  with displacement of adjacent vasculature.  Given the patient's age and its location, this may reflect a  thymoma, germ cell tumor or less likely lymphoma. If this arises  from the lung, it could reflect an atypical infection such as  tuberculosis, though the lack of leukocytosis or additional lung  disease suggests against infection.  2. Scattered mediastinal nodes are otherwise borderline normal in  size.  3. Diffuse haziness within the mediastinum raises question for  mild diffuse associated edema; underlying infiltration of the  mediastinum cannot be excluded, given the patient's esophageal  symptoms.  Assessment/Plan: Pulmonary/mediastinal mass = preliminary results showing positive for AFB, suggestive for extra-pulmonary TB. Currently on respiratory isolation. Specimen will be sent for PCR testing to differentiate TB vs. NTM.   - please have RT  induce sputums to see if AFB from pulmonary source then can decide when to discontinue respiratory precautions.  Fevers = started last night as well as this morning. Please repeat blood culture, ua and urine cx if T>100.3.    HIV/AIDS = we will treat for TB first before initiating ART, likely in 10-14 days.  In meantime, continue with OI prophylaxis with bactrim DS suspension daily and azithromycin 1200mg  suspension Qweek. Will likely do ral/truvada regimen since that would have less drug interaction with TB regimen.  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 Continue with prn magic mouthwash.  Intermittent fevers = has been afebrile.    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 AFB on smear from mediastinal bx as well as on path. await Mtb PCR .  On RIPE treatment. Rifabutin 300mg  daily, inh 300mg  daily, PZA 1500mg  daily, ethambutol 1000mg  daily, and vit b6 200mg  daily.   --Will also have patient due 2 induced sputums in order to decide if can remove from airborne precautions.  Positive toxo titers = currently on toxo proph with bactrim DS daily. No other signs or symptoms to suggest toxo encephalitis to consider need for treatment.  Anemia= thought to be due to blood loss from recent procedure, please continue to check CBC may need RBC transfusion  Iron def anemia = may benefit from iron infusion  Thrombocytopenia = drop could be due to GCV.   Not currently on heparin. Please check CBC to see if plts are improving  dispo =  ADAP application is submitted as  of 03/18/12 so that patient can get HIV meds for free. I called health dept who will send out TB public health nurse to interview pt today or Monday.   LOS: 9 days    [name] 03/19/2012, 12:39 PM

## 2012-03-19 NOTE — Progress Notes (Addendum)
                    [address]            [place] [address]          [phone]     3 Days Post-Op Procedure(s) (LRB): MEDIASTINOTOMY CHAMBERLAIN MCNEIL (Left)  Subjective: Feels better today. Nausea resolved.  Ate better last night. Breathing stable, little cough.  Objective: Vital signs in last 24 hours: Patient Vitals for the past 24 hrs:  BP Temp Temp src Resp  03/19/12 0319 102/50 mmHg 100.1 F (37.8 C) Oral -  03/19/12 0109 - 100.8 F (38.2 C) - 19   03/19/12 0000 99/55 mmHg 101.1 F (38.4 C) Oral -  03/18/12 2157 - - - 25   03/18/12 2000 97/49 mmHg 99.1 F (37.3 C) Oral -  03/18/12 1600 102/67 mmHg 98.7 F (37.1 C) Oral -  03/18/12 1200 120/73 mmHg 98.4 F (36.9 C) Oral -  03/18/12 0800 109/65 mmHg 99.5 F (37.5 C) Oral -   Current Weight  03/17/12 57.607 kg (127 lb)     Intake/Output from previous day: 04/04 0701 - 04/05 0700 In: 10 [I.V.:10] Out: 440 [Urine:390; Chest Tube:50]    PHYSICAL EXAM:  Heart: RRR Lungs: slightly decreased BS, overall clear Wound: clean and dry   Lab Results: CBC: Basename 03/18/12 0430 03/17/12 0423  WBC 5.1 2.4*  HGB 8.6* 9.8*  HCT 25.2* 28.9*  PLT 108* 135*   BMET:  Basename 03/18/12 0430 03/17/12 0423  NA 136 135  K 3.3* 4.0  CL 104 100  CO2 23 26  GLUCOSE 83 89  BUN 6 5*  CREATININE 0.41* 0.53  CALCIUM 8.0* 9.2    PT/INR:  Basename 03/16/12 0857  LABPROT 13.8  INR 1.04   CXR: Interval removal of left chest tube with tiny left apical  pneumothorax.  Path- necrotic and granulomatous inflammation  Assessment/Plan: S/P Procedure(s) (LRB): MEDIASTINOTOMY CHAMBERLAIN MCNEIL (Left) ID-Fevers overnight, WBC up slightly.  ID following. Continue current meds for HIV/AIDS. Tb, toxo. esophagitis. Stable from surgical standpoint.  Will d/c PCA, continue pulm toilet, ambulate as tolerated.   LOS: 9 days    COLLINS,GINA H 03/19/2012   patient examined and medical record  reviewed,agree with above note Chest tube out, chest x-ray clear, incision clean and dry. We'll follow. VAN TRIGT III,[place] or 03/19/2012

## 2012-03-19 NOTE — Progress Notes (Signed)
03/19/2012- 1304- Saline tx given to pt to assist with inducing sputum.  Pt with strong cough after tx but non-productive.  Specimen container left at pt bedside for possible sputum being produced later in shift. S Nuri Larmer rrt, rcp

## 2012-03-19 NOTE — Progress Notes (Signed)
[facility] [place] TEAM 1 - Stepdown/ICU TEAM  Subjective: [name] originally from [place] (speaks and understands 50 - 75% English) w/ newly diagnosed HIV/AIDS, CD 4 count of 20, VL 230k, WB+ confirmation. Her mother is aware of her HIV status, but she prefers that no one else know. 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 Patient was also noted to have large pulmonary/mediastinal mass of unknown etiology. She was taken for biopsy of the mediatinal mass via [place] procedure by TCTS 4/2 -with prelim results now suggesting TB as the etiology.    She is resting comfortably at the time of my visit today.  She denies sob, n/v, or abdom pain.  Her odynophagia has greatly improved.  She reports intermittent pleuritic type chest wall pain.  She is anxious to be d/c home.  PROCEDURES:  3/28 - Upper Endoscopy with biopsies of the esophagus 4/02 - Left Chamberlain procedure (anterior mediastinotomy and  biopsy)  Objective: Weight change:   Intake/Output Summary (Last 24 hours) at 03/19/12 1303 Last data filed at 03/19/12 1200  Gross per 24 hour  Intake    590 ml  Output      0 ml  Net    590 ml   Blood pressure 116/66, pulse 116, temperature 100.8 F (38.2 C), temperature source Oral, resp. rate 23, height 5\' 5"  (1.651 m), weight 57.607 kg (127 lb), last menstrual period 02/21/2012, SpO2 98.00%.  CBG (last 3)   Basename 03/17/12 0918 03/17/12 0802 03/16/12 2357  GLUCAP 104* 60* 86   Physical Exam:  General: No acute respiratory distress Lungs: Clear to auscultation bilaterally without wheezes or crackles Cardiovascular: Regular rate and rhythm without murmur gallop or rub  Abdomen: Nontender, nondistended, soft, bowel sounds positive, no rebound, no ascites, no appreciable mass Extremities: No significant cyanosis, clubbing, or edema bilateral lower extremities  Lab Results:  [facility] 03/18/12 0430 03/17/12 0423   NA 136 135  K 3.3* 4.0  CL 104 100  CO2 23 26  GLUCOSE 83 89  BUN 6 5*  CREATININE 0.41* 0.53  CALCIUM 8.0* 9.2  MG -- --  PHOS -- --    Basename 03/18/12 0430  AST 17  ALT 8  ALKPHOS 65  BILITOT 0.2*  PROT 6.3  ALBUMIN 2.5*    Basename 03/18/12 0430 03/17/12 0423  WBC 5.1 2.4*  NEUTROABS -- 1.7  HGB 8.6* 9.8*  HCT 25.2* 28.9*  MCV 86.9 87.0  PLT 108* 135*   Studies/Results: All recent x-ray/radiology reports have been reviewed in detail.   Medications: I have reviewed the patient's complete medication list.  Assessment/Plan:  Lung/mediastinal mass - TB Now s/p VATS bx - Path consistent with TB - started on meds per ID (RIPE tx) - studies pending to differentiate TB vs. NTM - ID to determine when resp isolation can be safely d/c'ed - chest tube out per TCTS  HSV Espohagitis/Odynophagia  HSV culture positive - on Valacyclovir 1g TID - eating/drinking w/o pain now  Leukopenia felt to be related to GCV- resolved  Thrombocytopenia Now appears to be stabilizing - follow trend   Positive titers for Toxoplasma without acute infection Bactrim for prophylaxis per ID  HIV+/AIDS CD4 count 20 - VL 200k - ID following and will guide initiation of ART once TB tx initiated (plan is for 10-14 days of TB tx before ART) - remains on OI prophy under ID guidance (septra and azithro)  Normocytic anemia Due  to above - Hgb dropping - will recheck in AM  Hypokalemia Replace - check Mg - likely due to poor intake given odynophagia  Tachycardia Likely due to low grade fever and pain - will strive to control pain better - assure is well hydrated - follow on tele   Dispo Cont to follow in SDU until tachy improved  Lonia Blood, MD [facility] Office  [phone] Pager [phone]  On-Call/Text Page:      [email or web address]      password [facility]

## 2012-03-20 LAB — CBC
HCT: 27.1 % — ABNORMAL LOW (ref 36.0–46.0)
Hemoglobin: 9.3 g/dL — ABNORMAL LOW (ref 12.0–15.0)
MCH: 29.7 pg (ref 26.0–34.0)
MCHC: 34.3 g/dL (ref 30.0–36.0)
MCV: 86.6 fL (ref 78.0–100.0)
Platelets: 179 10*3/uL (ref 150–400)
RBC: 3.13 MIL/uL — ABNORMAL LOW (ref 3.87–5.11)
RDW: 14.8 % (ref 11.5–15.5)
WBC: 4.6 10*3/uL (ref 4.0–10.5)

## 2012-03-20 LAB — BASIC METABOLIC PANEL
BUN: 4 mg/dL — ABNORMAL LOW (ref 6–23)
CO2: 24 mEq/L (ref 19–32)
Calcium: 9.2 mg/dL (ref 8.4–10.5)
Chloride: 103 mEq/L (ref 96–112)
Creatinine, Ser: 0.58 mg/dL (ref 0.50–1.10)
GFR calc Af Amer: >90 mL/min (ref >90)
GFR calc non Af Amer: >90 mL/min (ref >90)
Glucose, Bld: 92 mg/dL (ref 70–99)
Potassium: 4.7 mEq/L (ref 3.5–5.1)
Sodium: 136 mEq/L (ref 135–145)

## 2012-03-20 LAB — MAGNESIUM: Magnesium: 1.6 mg/dL (ref 1.5–2.5)

## 2012-03-20 MED ORDER — PROMETHAZINE HCL 25 MG/ML IJ SOLN: 12.5000 mg | Freq: Four times a day (QID) | INTRAMUSCULAR | Status: DC | PRN

## 2012-03-20 MED ORDER — SODIUM CHLORIDE 0.9 % IV SOLN
INTRAVENOUS | Status: DC
  Administered 2012-03-20 – 2012-03-21 (×2): via INTRAVENOUS
  Administered 2012-03-21: 20 mL via INTRAVENOUS

## 2012-03-20 MED ORDER — ONDANSETRON HCL 4 MG/2ML IJ SOLN: 4.0000 mg | INTRAMUSCULAR | Status: DC | PRN

## 2012-03-20 NOTE — Progress Notes (Signed)
[facility] [place] TEAM 1 - Stepdown/ICU TEAM  Subjective: [name] originally from [place] (speaks and understands 50 - 75% English) w/ newly diagnosed HIV/AIDS, CD 4 count of 20, VL 230k, WB+ confirmation. Her mother is aware of her HIV status, but she prefers that no one else know. 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 Patient was also noted to have large pulmonary/mediastinal mass of unknown etiology. She was taken for biopsy of the mediatinal mass via [place] procedure by TCTS 4/2 -with prelim results now suggesting TB as the etiology.    Pt has developed severe nausea with vomiting today.  She denies cp, sob, or abdom pain.    PROCEDURES:  3/28 - Upper Endoscopy with biopsies of the esophagus 4/02 - Left Chamberlain procedure (anterior mediastinotomy and  biopsy)  Objective: Weight change:   Intake/Output Summary (Last 24 hours) at 03/20/12 1330 Last data filed at 03/20/12 1000  Gross per 24 hour  Intake   1560 ml  Output      0 ml  Net   1560 ml   Blood pressure 117/74, pulse 114, temperature 100 F (37.8 C), temperature source Oral, resp. rate 26, height 5\' 5"  (1.651 m), weight 57.607 kg (127 lb), last menstrual period 02/21/2012, SpO2 99.00%.  CBG (last 3)  No results found for this basename: GLUCAP:3 in the last 72 hours Physical Exam:  General: No acute respiratory distress Lungs: Clear to auscultation bilaterally without wheezes or crackles Cardiovascular: Regular rate and rhythm without murmur gallop or rub  Abdomen: Nontender, nondistended, soft, bowel sounds positive, no rebound, no ascites, no appreciable mass Extremities: No significant cyanosis, clubbing, or edema bilateral LE  Lab Results:  Basename 03/20/12 0348 03/19/12 1345 03/18/12 0430  NA 136 137 136  K 4.7 3.8 3.3*  CL 103 104 104  CO2 24 25 23   GLUCOSE 92 122* 83  BUN 4* 8 6  CREATININE 0.58 0.44* 0.41*  CALCIUM 9.2 8.5  8.0*  MG 1.6 -- --  PHOS -- -- --    Basename 03/18/12 0430  AST 17  ALT 8  ALKPHOS 65  BILITOT 0.2*  PROT 6.3  ALBUMIN 2.5*    Basename 03/20/12 0348 03/19/12 1345 03/18/12 0430  WBC 4.6 5.3 5.1  NEUTROABS -- 4.0 --  HGB 9.3* 9.2* 8.6*  HCT 27.1* 27.0* 25.2*  MCV 86.6 86.5 86.9  PLT 179 139* 108*   Studies/Results: All recent x-ray/radiology reports have been reviewed in detail.   Medications: I have reviewed the patient's complete medication list.  Assessment/Plan:  Lung/mediastinal mass - TB Now s/p VATS bx - Path consistent with TB - started on meds per ID (RIPE tx) - studies pending to differentiate TB vs. NTM - ID to determine when resp isolation can be safely d/c'ed - chest tube out per TCTS  Nausea w/ vomiting  I suspect this is due to her TB meds - will cont to follow - tx w/ zofran prn - keep hydrated - vomiting may re-insult/aggravate her esophagitis  HSV Espohagitis/Odynophagia  HSV culture positive - on Valacyclovir 1g TID   Leukopenia felt to be related to GCV- resolved  Thrombocytopenia Now appears to be stabilizing - follow trend   Positive titers for Toxoplasma without acute infection Bactrim for prophylaxis per ID  HIV+/AIDS CD4 count 20 - VL 200k - ID following and will guide initiation of ART once TB tx initiated (plan is for 10-14 days of  TB tx before ART) - remains on OI prophy under ID guidance (septra and azithro)  Normocytic anemia Due to above - Hgb stable today  Hypokalemia Replaced - Mg is ok  Tachycardia Likely due to low grade fever and pain - has improved overall in last 24hrs - will cont to monitor  Dispo Cont to follow in SDU on negative pressure resp isolation  Lonia Blood, MD [facility] Office  [phone] Pager [phone]  On-Call/Text Page:      [email or web address]      password [facility]

## 2012-03-20 NOTE — Progress Notes (Signed)
INFECTIOUS DISEASE PROGRESS NOTE  ID: Kelsey Rodriguez is a 21 y.o. female with   Active Problems:  Chest pain  Dysphagia  Lung mass  Anemia  Reflux esophagitis  Ulcer of esophagus without bleeding  HIV (human immunodeficiency virus infection)  Subjective: N/v from "pink liquid medicine"  Abtx:  Anti-infectives     Start     Dose/Rate Route Frequency Ordered Stop   03/18/12 1130   rifabutin (MYCOBUTIN) capsule 300 mg        300 mg Oral Daily 03/18/12 1011     03/18/12 1130   pyrazinamide tablet 1,500 mg        1,500 mg Oral Daily 03/18/12 1011     03/18/12 1130   isoniazid (NYDRAZID) tablet 300 mg        300 mg Oral Daily 03/18/12 1011     03/18/12 1130   ethambutol (MYAMBUTOL) tablet 1,000 mg        1,000 mg Oral Daily 03/18/12 1011     03/17/12 1530   valACYclovir (VALTREX) tablet 1,000 mg        1,000 mg Oral 2 times daily 03/17/12 1527     03/17/12 0830   cefUROXime (ZINACEF) injection 1.5 g  Status:  Discontinued        1.5 g Intramuscular Every 12 hours 03/17/12 0812 03/17/12 0821   03/17/12 0830   cefUROXime (ZINACEF) 1.5 g in dextrose 5 % 50 mL IVPB  Status:  Discontinued        1.5 g 100 mL/hr over 30 Minutes Intravenous Every 12 hours 03/17/12 0821 03/17/12 0821   03/17/12 0830   cefUROXime (ZINACEF) 1.5 g in dextrose 5 % 50 mL IVPB        1.5 g 100 mL/hr over 30 Minutes Intravenous Every 12 hours 03/17/12 0821 03/18/12 1535   03/16/12 0600   cefUROXime (ZINACEF) 1.5 g in dextrose 5 % 50 mL IVPB  Status:  Discontinued        1.5 g 100 mL/hr over 30 Minutes Intravenous 60 min pre-op 03/15/12 1916 03/16/12 1950   03/14/12 1000   azithromycin (ZITHROMAX) 200 MG/5ML suspension 1,200 mg  Status:  Discontinued        1,200 mg Oral Daily 03/13/12 1026 03/13/12 1041   03/13/12 1130  sulfamethoxazole-trimethoprim (BACTRIM,SEPTRA) 200-40 MG/5ML suspension 20 mL       20 mL Oral Daily 03/13/12 1026     03/13/12 1130   azithromycin (ZITHROMAX) 200 MG/5ML suspension 1,200  mg        1,200 mg Oral Weekly 03/13/12 1041     03/12/12 2100   ganciclovir (CYTOVENE) 290 mg in sodium chloride 0.9 % 100 mL IVPB  Status:  Discontinued        5 mg/kg  57.6 kg 100 mL/hr over 60 Minutes Intravenous Every 12 hours 03/12/12 1457 03/17/12 1123   03/12/12 1300   ganciclovir (CYTOVENE) 290 mg in sodium chloride 0.9 % 100 mL IVPB  Status:  Discontinued        5 mg/kg  57.6 kg 100 mL/hr over 60 Minutes Intravenous Every 12 hours 03/12/12 1150 03/12/12 1457          Medications:  Scheduled:   . alteplase  2 mg Intracatheter Once  . azithromycin  1,200 mg Oral Weekly  . bisacodyl  10 mg Oral Daily  . ethambutol  1,000 mg Oral Daily  . isoniazid  300 mg Oral Daily  . pantoprazole  40 mg Oral Q0600  .  potassium chloride  40 mEq Oral BID  . pyrazinamide  1,500 mg Oral Daily  . pyridOXINE  50 mg Oral Daily  . rifabutin  300 mg Oral Daily  . sodium chloride  10-40 mL Intracatheter Q12H  . sulfamethoxazole-trimethoprim  20 mL Oral Daily  . valACYclovir  1,000 mg Oral BID    Objective: Vital signs in last 24 hours: Temp:  [97.6 F (36.4 C)-102.2 F (39 C)] 100 F (37.8 C) (04/06 1130) Pulse Rate:  [107-135] 114  (04/06 1130) Resp:  [15-26] 26  (04/06 1130) BP: (104-119)/(64-74) 117/74 mmHg (04/06 1130) SpO2:  [98 %-100 %] 99 % (04/06 1130)   General appearance: alert, cooperative and no distress Resp: clear to auscultation bilaterally Chest wall: no tenderness, L upper chest wall wound clean, steri-strips in place Cardio: tachycardia GI: normal findings: bowel sounds normal and soft, non-tender  Lab Results  Basename 03/20/12 0348 03/19/12 1345  WBC 4.6 5.3  HGB 9.3* 9.2*  HCT 27.1* 27.0*  NA 136 137  K 4.7 3.8  CL 103 104  CO2 24 25  BUN 4* 8  CREATININE 0.58 0.44*  GLU -- --   Liver Panel  Basename 03/18/12 0430  PROT 6.3  ALBUMIN 2.5*  AST 17  ALT 8  ALKPHOS 65  BILITOT 0.2*  BILIDIR --  IBILI --   Sedimentation Rate No results  found for this basename: ESRSEDRATE in the last 72 hours C-Reactive Protein No results found for this basename: CRP:2 in the last 72 hours  Microbiology: Recent Results (from the past 240 hour(s))  HERPES SIMPLEX VIRUS CULTURE     Status: Normal   Collection Time   03/11/12  3:56 PM      Component Value Range Status Comment   Specimen Description ESOPHAGUS   Final    Special Requests NONE   Final    Culture Herpes Simplex Type 1 detected.   Final    Report Status 03/17/2012 FINAL   Final   CULTURE, BLOOD (ROUTINE X 2)     Status: Normal   Collection Time   03/12/12  6:10 AM      Component Value Range Status Comment   Specimen Description BLOOD RIGHT ARM   Final    Special Requests BOTTLES DRAWN AEROBIC AND ANAEROBIC [facility]   Final    Culture  Setup Time [id number]   Final    Culture NO GROWTH 5 DAYS   Final    Report Status 03/18/2012 FINAL   Final   CULTURE, BLOOD (ROUTINE X 2)     Status: Normal   Collection Time   03/12/12  6:20 AM      Component Value Range Status Comment   Specimen Description BLOOD RIGHT WRIST   Final    Special Requests BOTTLES DRAWN AEROBIC AND ANAEROBIC [facility]   Final    Culture  Setup Time [id number]   Final    Culture NO GROWTH 5 DAYS   Final    Report Status 03/18/2012 FINAL   Final   SURGICAL PCR SCREEN     Status: Normal   Collection Time   03/12/12  1:06 PM      Component Value Range Status Comment   MRSA, PCR NEGATIVE  NEGATIVE  Final    Staphylococcus aureus NEGATIVE  NEGATIVE  Final   AFB CULTURE, BLOOD     Status: Normal (Preliminary result)   Collection Time   03/14/12  4:47 PM      Component Value  Range Status Comment   Specimen Description BLOOD LEFT ARM   Final    Special Requests BLACK 5CC   Final    Culture     Final    Value: CULTURE WILL BE EXAMINED FOR 6 WEEKS BEFORE ISSUING A FINAL REPORT   Report Status PENDING   Incomplete   SURGICAL PCR SCREEN     Status: Normal   Collection Time   03/16/12 12:20 PM      Component  Value Range Status Comment   MRSA, PCR NEGATIVE  NEGATIVE  Final    Staphylococcus aureus NEGATIVE  NEGATIVE  Final   AFB CULTURE WITH SMEAR     Status: Normal (Preliminary result)   Collection Time   03/16/12  5:24 PM      Component Value Range Status Comment   Specimen Description TISSUE   Final    Special Requests     Final    Value: PT ON ZINACEF,AZITHROMYCIN,CYTOVENE,SEPTRA MEDIASTINAL MASS   ACID FAST SMEAR     Final    Value: 4+ ACID FAST BACILLI SEEN CRITICAL RESULT CALLED TO, READ BACK BY AND VERIFIED WITH: DR Jerolyn Center 14:15 03/17/12 GF   Culture     Final    Value: CULTURE WILL BE EXAMINED FOR 6 WEEKS BEFORE ISSUING A FINAL REPORT   Report Status PENDING   Incomplete   FUNGUS CULTURE W SMEAR     Status: Normal (Preliminary result)   Collection Time   03/16/12  5:24 PM      Component Value Range Status Comment   Specimen Description TISSUE   Final    Special Requests     Final    Value: PT ON ZINACEF,AZITHROMYCIN,CYTOVENE,SEPTRA MEDIASTINAL MASS   Fungal Smear NO YEAST OR FUNGAL ELEMENTS SEEN   Final    Culture CULTURE IN PROGRESS FOR FOUR WEEKS   Final    Report Status PENDING   Incomplete   CULTURE, BLOOD (ROUTINE X 2)     Status: Normal (Preliminary result)   Collection Time   03/19/12 12:35 PM      Component Value Range Status Comment   Specimen Description BLOOD LEFT ARM   Final    Special Requests BOTTLES DRAWN AEROBIC AND ANAEROBIC 10CC   Final    Culture  Setup Time [id number]   Final    Culture     Final    Value:        BLOOD CULTURE RECEIVED NO GROWTH TO DATE CULTURE WILL BE HELD FOR 5 DAYS BEFORE ISSUING A FINAL NEGATIVE REPORT   Report Status PENDING   Incomplete   CULTURE, BLOOD (ROUTINE X 2)     Status: Normal (Preliminary result)   Collection Time   03/19/12 12:50 PM      Component Value Range Status Comment   Specimen Description BLOOD LEFT HAND   Final    Special Requests BOTTLES DRAWN AEROBIC AND ANAEROBIC 10CC   Final    Culture  Setup Time  [id number]   Final    Culture     Final    Value:        BLOOD CULTURE RECEIVED NO GROWTH TO DATE CULTURE WILL BE HELD FOR 5 DAYS BEFORE ISSUING A FINAL NEGATIVE REPORT   Report Status PENDING   Incomplete     Studies/Results: Dg Chest 2 View  03/19/2012  *RADIOLOGY REPORT*  Clinical Data: Chest tube removal.  CHEST - 2 VIEW  Comparison: 03/18/2012  Findings: Interval removal of the left  chest tube.  Tiny left apical pneumothorax.  Right PICC line is unchanged.  Heart is mildly enlarged.  There is atelectasis in the left lung base.  IMPRESSION: Interval removal of left chest tube with tiny left apical pneumothorax.  Original Report Authenticated By: Cyndie Chime, M.D.     Assessment/Plan: Mediastinal Mass, AFB+ HSV esophagitis AIDS Day3 of TB rx ([facility]) Await PCR of tissue to determine TB vs non-TB  Sputum AFB pending.  Continue isolation for now.  No fever o/n.  Hold azithro due to n/v Please call if q's on 4-7, thanks Johny Sax Infectious Diseases [phone] 03/20/2012, 1:24 PM

## 2012-03-20 NOTE — Progress Notes (Signed)
Notified M. Lynch of [facility] of pt's Temp 102.7. No orders, received, will continue to monitor.

## 2012-03-20 NOTE — Progress Notes (Signed)
4 Days Post-Op Procedure(s) (LRB): MEDIASTINOTOMY CHAMBERLAIN MCNEIL (Left) Subjective: C/o itching at CT site Mild incisional discomfort  Objective: Vital signs in last 24 hours: Temp:  [97.6 F (36.4 C)-102.2 F (39 C)] 99.5 F (37.5 C) (04/06 0735) Pulse Rate:  [107-135] 108  (04/06 0735) Cardiac Rhythm:  [-] Sinus tachycardia (04/06 0735) Resp:  [15-23] 19  (04/06 0735) BP: (104-119)/(64-73) 104/64 mmHg (04/06 0735) SpO2:  [98 %-100 %] 100 % (04/06 0735)  Hemodynamic parameters for last 24 hours:    Intake/Output from previous day: 04/05 0701 - 04/06 0700 In: 1680 [P.O.:1440; I.V.:240] Out: -  Intake/Output this shift: Total I/O In: 480 [P.O.:480] Out: -   Wound: clean and dry  Lab Results:  [facility] 03/20/12 0348 03/19/12 1345  WBC 4.6 5.3  HGB 9.3* 9.2*  HCT 27.1* 27.0*  PLT 179 139*   BMET:  Basename 03/20/12 0348 03/19/12 1345  NA 136 137  K 4.7 3.8  CL 103 104  CO2 24 25  GLUCOSE 92 122*  BUN 4* 8  CREATININE 0.58 0.44*  CALCIUM 9.2 8.5    PT/INR: No results found for this basename: LABPROT,INR in the last 72 hours ABG    Component Value Date/Time   PHART 7.359 03/17/2012 0419   HCO3 26.0* 03/17/2012 0419   TCO2 27 03/17/2012 0419   ACIDBASEDEF 0.9 03/16/2012 0940   O2SAT 99.0 03/17/2012 0419   CBG (last 3)  No results found for this basename: GLUCAP:3 in the last 72 hours  Assessment/Plan: S/P Procedure(s) (LRB): MEDIASTINOTOMY CHAMBERLAIN MCNEIL (Left) s/p Lula Olszewski- dx with TB Wounds healing well Plan per medical service   LOS: 10 days    Temica Righetti C 03/20/2012

## 2012-03-21 ENCOUNTER — Inpatient Hospital Stay (HOSPITAL_COMMUNITY): Payer: Medicaid Other

## 2012-03-21 LAB — URINE CULTURE
Colony Count: NO GROWTH
Culture  Setup Time: 201304051718
Culture: NO GROWTH

## 2012-03-21 NOTE — Progress Notes (Signed)
[facility]  TEAM 1 - Stepdown/ICU TEAM  Subjective:  Interim Summary [name] originally from [place] (speaks and understands 61 - 75% English) w/ newly diagnosed HIV/AIDS, CD 4 count of 20, VL 230k, WB+ confirmation. Her mother is aware of her HIV status, but she prefers that no one else know. 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 Patient was also noted to have large pulmonary/mediastinal mass of unknown etiology. 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  Her postop course has been without significant new developments.  The ID service is following along with [place] and is making the vast majority of tx decisions at this time.  She remains on negative pressure isolation, while we attempt to obtain sputum samples wch will confirm the safety of stopping this isolation.  ID is also awaiting studied to confirm if this is TB vs/ NTM.  Additionally, ID is making the necessary arrangements to assure that the pt is provided with her HIV and OI prohphy meds, along with her TB tx as an outpt.  The decision on D/C timing will be made by the ID service.    HPI The patient's nausea and vomiting, which coincided with dosing of azithromycin, have resolved.  She is in good spirits and has not complaints today.  She denies cp, sob, n/v, or abdom pain.  She is anxious to be d/c home.   PROCEDURES:  3/28 - Upper Endoscopy with biopsies of the esophagus 4/02 - Left Chamberlain procedure (anterior mediastinotomy and  biopsy)  Objective: Weight change:   Intake/Output Summary (Last 24 hours) at 03/21/12 1413 Last data filed at 03/21/12 1200  Gross per 24 hour  Intake   1035 ml  Output      0 ml  Net   1035 ml   Blood pressure 116/70, pulse 99, temperature 98.3 F (36.8 C), temperature source Oral, resp. rate 20, height 5\' 5"  (1.651 m), weight  57.607 kg (127 lb), last menstrual period 02/21/2012, SpO2 100.00%.  CBG (last 3)  No results found for this basename: GLUCAP:3 in the last 72 hours  Physical Exam: General: No acute respiratory distress Lungs: Clear to auscultation bilaterally without wheezes or crackles Cardiovascular: Regular rate and rhythm without murmur gallop or rub  Abdomen: Nontender, nondistended, soft, bowel sounds positive, no rebound, no ascites, no appreciable mass Extremities: No significant cyanosis, clubbing, or edema bilateral LE  Lab Results:  [facility] 03/20/12 0348 03/19/12 1345  NA 136 137  K 4.7 3.8  CL 103 104  CO2 24 25  GLUCOSE 92 122*  BUN 4* 8  CREATININE 0.58 0.44*  CALCIUM 9.2 8.5  MG 1.6 --  PHOS -- --    Basename 03/20/12 0348 03/19/12 1345  WBC 4.6 5.3  NEUTROABS -- 4.0  HGB 9.3* 9.2*  HCT 27.1* 27.0*  MCV 86.6 86.5  PLT 179 139*   Studies/Results: All recent x-ray/radiology reports have been reviewed in detail.   Medications: I have reviewed the patient's complete medication list.  Assessment/Plan:  Lung/mediastinal mass - TB Now s/p VATS bx - Path consistent with TB - started on meds per ID ([facility] tx) - studies pending to differentiate TB vs. NTM - ID to determine when resp isolation can be safely d/c'ed - chest tube out per TCTS  Nausea w/ vomiting  Now resolved - was temporally related to  azithro dosing - ID has stopped azithro for now   HSV Espohagitis/Odynophagia  HSV culture positive - on Valacyclovir 1g TID - course of tx to be determined by ID  Leukopenia felt to be related to GCV- resolved  Thrombocytopenia resolved  Positive titers for Toxoplasma without acute infection Bactrim for prophylaxis per ID  HIV+/AIDS CD4 count 20 - VL 200k - ID following and will guide initiation of ART once TB tx initiated (plan is for 10-14 days of TB tx before ART) - remains on OI prophy under ID guidance (septra)  Normocytic anemia Due to above - Hgb stable    Hypokalemia Replaced - Mg is ok  Tachycardia Likely due to low grade fever and pain - has improved - given clinical stability and young age, will d/c tele   Dispo Transfer to medical bed, but will CONTINUE negative pressure isolation until cleared by ID - see my interim summary above  LOS >  11  / Days with Team 1 > 4  Lonia Blood, MD [facility] Office  [phone] Pager [phone]  On-Call/Text Page:      [email or web address]      password [email or web address]

## 2012-03-21 NOTE — Progress Notes (Addendum)
                    [address]            [place] [address]          [phone]     5 Days Post-Op Procedure(s) (LRB): MEDIASTINOTOMY CHAMBERLAIN MCNEIL (Left)  Subjective: No new complaints.  Objective: Vital signs in last 24 hours: Patient Vitals for the past 24 hrs:  BP Temp Temp src Pulse Resp SpO2  03/21/12 0830 116/79 mmHg 99.1 F (37.3 C) Oral 114  17  100 %  03/21/12 0300 105/55 mmHg 98.9 F (37.2 C) Oral 112  21  100 %  03/20/12 2300 108/57 mmHg 99.4 F (37.4 C) Oral 113  17  100 %  03/20/12 1900 109/70 mmHg 100.6 F (38.1 C) Oral 117  23  100 %  03/20/12 1450 124/72 mmHg 98.7 F (37.1 C) Oral 126  14  100 %  03/20/12 1130 117/74 mmHg 100 F (37.8 C) Oral 114  26  99 %   Current Weight  03/17/12 57.607 kg (127 lb)     Intake/Output from previous day: 04/06 0701 - 04/07 0700 In: 1335 [P.O.:960; I.V.:375] Out: -     PHYSICAL EXAM:  Heart: RRR Lungs: decreased BS Wound: stable   Lab Results: CBC: Basename 03/20/12 0348 03/19/12 1345  WBC 4.6 5.3  HGB 9.3* 9.2*  HCT 27.1* 27.0*  PLT 179 139*   BMET:  Basename 03/20/12 0348 03/19/12 1345  NA 136 137  K 4.7 3.8  CL 103 104  CO2 24 25  GLUCOSE 92 122*  BUN 4* 8  CREATININE 0.58 0.44*  CALCIUM 9.2 8.5    PT/INR: No results found for this basename: LABPROT,INR in the last 72 hours  Assessment/Plan: S/P Procedure(s) (LRB): MEDIASTINOTOMY CHAMBERLAIN MCNEIL (Left) Stable from surgical standpoint. Continue current care per IM, ID.   LOS: 11 days   patient examined and medical record reviewed,agree with above note. VAN TRIGT III,Jenifer Struve 03/24/2012    COLLINS,GINA H 03/21/2012

## 2012-03-22 DIAGNOSIS — B20 Human immunodeficiency virus [HIV] disease: Secondary | ICD-10-CM

## 2012-03-22 DIAGNOSIS — A158 Other respiratory tuberculosis: Secondary | ICD-10-CM

## 2012-03-22 MED ORDER — ENSURE COMPLETE PO LIQD
237.0000 mL | Freq: Two times a day (BID) | ORAL | Status: DC
  Administered 2012-03-22 – 2012-03-25 (×6): 237 mL via ORAL

## 2012-03-22 NOTE — Progress Notes (Signed)
Subjective: No new complaints   Antibiotics:  Anti-infectives     Start     Dose/Rate Route Frequency Ordered Stop   03/18/12 1130   rifabutin (MYCOBUTIN) capsule 300 mg        300 mg Oral Daily 03/18/12 1011     03/18/12 1130   pyrazinamide tablet 1,500 mg        1,500 mg Oral Daily 03/18/12 1011     03/18/12 1130   isoniazid (NYDRAZID) tablet 300 mg        300 mg Oral Daily 03/18/12 1011     03/18/12 1130   ethambutol (MYAMBUTOL) tablet 1,000 mg        1,000 mg Oral Daily 03/18/12 1011     03/17/12 1530   valACYclovir (VALTREX) tablet 1,000 mg        1,000 mg Oral 2 times daily 03/17/12 1527     03/17/12 0830   cefUROXime (ZINACEF) injection 1.5 g  Status:  Discontinued        1.5 g Intramuscular Every 12 hours 03/17/12 0812 03/17/12 0821   03/17/12 0830   cefUROXime (ZINACEF) 1.5 g in dextrose 5 % 50 mL IVPB  Status:  Discontinued        1.5 g 100 mL/hr over 30 Minutes Intravenous Every 12 hours 03/17/12 0821 03/17/12 0821   03/17/12 0830   cefUROXime (ZINACEF) 1.5 g in dextrose 5 % 50 mL IVPB        1.5 g 100 mL/hr over 30 Minutes Intravenous Every 12 hours 03/17/12 0821 03/18/12 1535   03/16/12 0600   cefUROXime (ZINACEF) 1.5 g in dextrose 5 % 50 mL IVPB  Status:  Discontinued        1.5 g 100 mL/hr over 30 Minutes Intravenous 60 min pre-op 03/15/12 1916 03/16/12 1950   03/14/12 1000   azithromycin (ZITHROMAX) 200 MG/5ML suspension 1,200 mg  Status:  Discontinued        1,200 mg Oral Daily 03/13/12 1026 03/13/12 1041   03/13/12 1130  sulfamethoxazole-trimethoprim (BACTRIM,SEPTRA) 200-40 MG/5ML suspension 20 mL       20 mL Oral Daily 03/13/12 1026     03/13/12 1130   azithromycin (ZITHROMAX) 200 MG/5ML suspension 1,200 mg  Status:  Discontinued        1,200 mg Oral Weekly 03/13/12 1041 03/20/12 1334   03/12/12 2100   ganciclovir (CYTOVENE) 290 mg in sodium chloride 0.9 % 100 mL IVPB  Status:  Discontinued        5 mg/kg  57.6 kg 100 mL/hr over 60 Minutes  Intravenous Every 12 hours 03/12/12 1457 03/17/12 1123   03/12/12 1300   ganciclovir (CYTOVENE) 290 mg in sodium chloride 0.9 % 100 mL IVPB  Status:  Discontinued        5 mg/kg  57.6 kg 100 mL/hr over 60 Minutes Intravenous Every 12 hours 03/12/12 1150 03/12/12 1457          Medications: Scheduled Meds:   . alteplase  2 mg Intracatheter Once  . bisacodyl  10 mg Oral Daily  . ethambutol  1,000 mg Oral Daily  . feeding supplement  237 mL Oral BID BM  . isoniazid  300 mg Oral Daily  . pantoprazole  40 mg Oral Q0600  . pyrazinamide  1,500 mg Oral Daily  . pyridOXINE  50 mg Oral Daily  . rifabutin  300 mg Oral Daily  . sulfamethoxazole-trimethoprim  20 mL Oral Daily  . valACYclovir  1,000 mg Oral BID  Continuous Infusions:   . sodium chloride 20 mL (03/21/12 1535)   PRN Meds:.acetaminophen, acetaminophen, lidocaine, morphine injection, ondansetron (ZOFRAN) IV, oxyCODONE, promethazine, senna-docusate, sodium chloride   Objective: Weight change:   Intake/Output Summary (Last 24 hours) at 03/22/12 1151 Last data filed at 03/21/12 1700  Gross per 24 hour  Intake    660 ml  Output      0 ml  Net    660 ml   Blood pressure 115/73, pulse 111, temperature 98.9 F (37.2 C), temperature source Oral, resp. rate 20, height 5\' 5"  (1.651 m), weight 127 lb (57.607 kg), last menstrual period 02/21/2012, SpO2 96.00%. Temp:  [98.9 F (37.2 C)-99.4 F (37.4 C)] 98.9 F (37.2 C) (04/08 0559) Pulse Rate:  [109-111] 111  (04/08 0559) Resp:  [16-20] 20  (04/08 0559) BP: (99-115)/(60-75) 115/73 mmHg (04/08 0559) SpO2:  [96 %-100 %] 96 % (04/08 0559)  Physical Exam: General: Alert and awake, oriented x3, not in any acute distress. HEENT: anicteric sclera, pupils reactive to light and accommodation, EOMI CVS regular rate, normal r,  no murmur rubs or gallops Chest: fairly clear to auscultation bilaterally, no wheezing, rales or rhonchi Abdomen: soft nontender, nondistended, normal bowel  sounds, Extremities: no  clubbing or edema noted bilaterally Neuro: nonfocal  Lab Results:  Basename 03/20/12 0348 03/19/12 1345  WBC 4.6 5.3  HGB 9.3* 9.2*  HCT 27.1* 27.0*  PLT 179 139*    BMET  Basename 03/20/12 0348 03/19/12 1345  NA 136 137  K 4.7 3.8  CL 103 104  CO2 24 25  GLUCOSE 92 122*  BUN 4* 8  CREATININE 0.58 0.44*  CALCIUM 9.2 8.5    Micro Results: Recent Results (from the past 240 hour(s))  SURGICAL PCR SCREEN     Status: Normal   Collection Time   03/12/12  1:06 PM      Component Value Range Status Comment   MRSA, PCR NEGATIVE  NEGATIVE  Final    Staphylococcus aureus NEGATIVE  NEGATIVE  Final   AFB CULTURE, BLOOD     Status: Normal (Preliminary result)   Collection Time   03/14/12  4:47 PM      Component Value Range Status Comment   Specimen Description BLOOD LEFT ARM   Final    Special Requests BLACK 5CC   Final    Culture     Final    Value: CULTURE WILL BE EXAMINED FOR 6 WEEKS BEFORE ISSUING A FINAL REPORT   Report Status PENDING   Incomplete   SURGICAL PCR SCREEN     Status: Normal   Collection Time   03/16/12 12:20 PM      Component Value Range Status Comment   MRSA, PCR NEGATIVE  NEGATIVE  Final    Staphylococcus aureus NEGATIVE  NEGATIVE  Final   AFB CULTURE WITH SMEAR     Status: Normal (Preliminary result)   Collection Time   03/16/12  5:24 PM      Component Value Range Status Comment   Specimen Description TISSUE   Final    Special Requests     Final    Value: PT ON ZINACEF,AZITHROMYCIN,CYTOVENE,SEPTRA MEDIASTINAL MASS   ACID FAST SMEAR     Final    Value: 4+ ACID FAST BACILLI SEEN CRITICAL RESULT CALLED TO, READ BACK BY AND VERIFIED WITH: DR Jerolyn Center 14:15 03/17/12 GF   Culture     Final    Value: CULTURE WILL BE EXAMINED FOR 6 WEEKS BEFORE ISSUING A  FINAL REPORT   Report Status PENDING   Incomplete   FUNGUS CULTURE W SMEAR     Status: Normal (Preliminary result)   Collection Time   03/16/12  5:24 PM      Component Value Range  Status Comment   Specimen Description TISSUE   Final    Special Requests     Final    Value: PT ON ZINACEF,AZITHROMYCIN,CYTOVENE,SEPTRA MEDIASTINAL MASS   Fungal Smear NO YEAST OR FUNGAL ELEMENTS SEEN   Final    Culture CULTURE IN PROGRESS FOR FOUR WEEKS   Final    Report Status PENDING   Incomplete   CULTURE, BLOOD (ROUTINE X 2)     Status: Normal (Preliminary result)   Collection Time   03/19/12 12:35 PM      Component Value Range Status Comment   Specimen Description BLOOD LEFT ARM   Final    Special Requests BOTTLES DRAWN AEROBIC AND ANAEROBIC 10CC   Final    Culture  Setup Time [id number]   Final    Culture     Final    Value:        BLOOD CULTURE RECEIVED NO GROWTH TO DATE CULTURE WILL BE HELD FOR 5 DAYS BEFORE ISSUING A FINAL NEGATIVE REPORT   Report Status PENDING   Incomplete   CULTURE, BLOOD (ROUTINE X 2)     Status: Normal (Preliminary result)   Collection Time   03/19/12 12:50 PM      Component Value Range Status Comment   Specimen Description BLOOD LEFT HAND   Final    Special Requests BOTTLES DRAWN AEROBIC AND ANAEROBIC 10CC   Final    Culture  Setup Time [id number]   Final    Culture     Final    Value:        BLOOD CULTURE RECEIVED NO GROWTH TO DATE CULTURE WILL BE HELD FOR 5 DAYS BEFORE ISSUING A FINAL NEGATIVE REPORT   Report Status PENDING   Incomplete   AFB CULTURE WITH SMEAR     Status: Normal (Preliminary result)   Collection Time   03/19/12  3:46 PM      Component Value Range Status Comment   Specimen Description SPUTUM   Final    Special Requests Immunocompromised   Final    ACID FAST SMEAR     Final    Value: SUSPICIOUS AFB SMEAR (1-2 ORGANISMS SEEN ON THE ENTIRE SLIDE;PLEASE SUBMIT ADDITIONAL SPECIMENS).   Culture     Final    Value: CULTURE WILL BE EXAMINED FOR 6 WEEKS BEFORE ISSUING A FINAL REPORT   Report Status PENDING   Incomplete   URINE CULTURE     Status: Normal   Collection Time   03/19/12  3:47 PM      Component Value Range Status Comment    Specimen Description URINE, CLEAN CATCH   Final    Special Requests Immunocompromised   Final    Culture  Setup Time [id number]   Final    Colony Count NO GROWTH   Final    Culture NO GROWTH   Final    Report Status 03/21/2012 FINAL   Final     Studies/Results: Dg Chest 2 View  03/21/2012  *RADIOLOGY REPORT*  Clinical Data: Follow up infiltrate.  Recurring fevers  CHEST - 2 VIEW  Comparison: 03/19/2012  Findings: Left upper lobe paramediastinal mass and anterior mediastinal adenopathy is unchanged from previous exam.  The heart size is normal.  There is a small  left pleural effusion, stable from previous exam.  Right lung is clear.  IMPRESSION:  1.  No significant change in the left upper lobe mass and anterior mediastinal adenopathy. 2.  No acute infiltrates noted.  Original Report Authenticated By: Rosealee Albee, M.D.      Assessment/Plan: Kelsey Rodriguez is a 21 y.o. 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  1) TB:  --continue RIPE --would like Resp Therapy to induce another AFB sputum to better quantify degree of positivity on smear of sputum and infectiousness  2) HIV: ADAP was approved --will plan on isentress and truvada beign started after at least 10-14 days of TB rx  3) HSV esophagitis: --continue valtrex tid  Disp:  Will need plug in with [facility] a week or so after DC   LOS: 12 days   Acey Lav 03/22/2012, 11:51 AM

## 2012-03-22 NOTE — Progress Notes (Signed)
[facility] [place] TEAM 1 - Stepdown/ICU TEAM  Subjective: She denies any shortness of breath or any other complaints.  Interim Summary [name] originally from [place] (speaks and understands 60 - 75% English) w/ newly diagnosed HIV/AIDS, CD 4 count of 20, VL 230k, WB+ confirmation. Her mother is aware of her HIV status, but she prefers that no one else know. 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 Patient was also noted to have large pulmonary/mediastinal mass of unknown etiology. She was taken for biopsy of the mediatinal mass via [place] procedure by TCTS 4/2 -with results now suggesting TB as the etiology.  Her postop course has been without significant new developments.  The ID service is following along with [place] and is making the vast majority of tx decisions at this time.  She remains on negative pressure isolation, while we attempt to obtain sputum samples wch will confirm the safety of stopping this isolation.  ID is also awaiting studied to confirm if this is TB vs/ NTM.  Additionally, ID is making the necessary arrangements to assure that the pt is provided with her HIV and OI prohphy meds, along with her TB tx as an outpt.  The decision on D/C timing will be made by the ID service.    PROCEDURES:  3/28 - Upper Endoscopy with biopsies of the esophagus 4/02 - Left Chamberlain procedure (anterior mediastinotomy and  biopsy)  Objective: Weight change:   Intake/Output Summary (Last 24 hours) at 03/22/12 1408 Last data filed at 03/22/12 0900  Gross per 24 hour  Intake    435 ml  Output      0 ml  Net    435 ml   Blood pressure 115/73, pulse 111, temperature 98.9 F (37.2 C), temperature source Oral, resp. rate 20, height 5\' 5"  (1.651 m), weight 57.607 kg (127 lb), last menstrual period 02/21/2012, SpO2 96.00%.  CBG (last 3)  No results found for this basename: GLUCAP:3 in the last 72 hours  Physical  Exam: General: No acute respiratory distress Lungs: Clear to auscultation bilaterally without wheezes or crackles Cardiovascular: Regular rate and rhythm without murmur gallop or rub  Abdomen: Nontender, nondistended, soft, bowel sounds positive, no rebound, no ascites, no appreciable mass Extremities: No significant cyanosis, clubbing, or edema bilateral LE  Lab Results:  [facility] 03/20/12 0348  NA 136  K 4.7  CL 103  CO2 24  GLUCOSE 92  BUN 4*  CREATININE 0.58  CALCIUM 9.2  MG 1.6  PHOS --    Basename 03/20/12 0348  WBC 4.6  NEUTROABS --  HGB 9.3*  HCT 27.1*  MCV 86.6  PLT 179   Studies/Results: All recent x-ray/radiology reports have been reviewed in detail.   Medications: I have reviewed the patient's complete medication list.  Assessment/Plan:  Lung/mediastinal mass - TB Now s/p VATS bx - Path consistent with TB - started on meds per ID (RIPE tx) - studies pending to differentiate TB vs. NTM - ID to determine when resp isolation can be safely d/c'ed - chest tube out per TCTS  Nausea w/ vomiting  Now resolved - was temporally related to azithro dosing - ID has stopped azithro for now   HSV Espohagitis/Odynophagia  HSV culture positive - on Valacyclovir 1g TID - course of tx to be determined by ID  Leukopenia felt to be related to GCV- resolved  Thrombocytopenia resolved  Positive titers for Toxoplasma without acute infection Bactrim  for prophylaxis per ID  HIV+/AIDS CD4 count 20 - VL 200k - ID following and will guide initiation of ART once TB tx initiated (plan is for 10-14 days of TB tx before ART) - remains on OI prophy under ID guidance (septra)  Normocytic anemia Due to above - Hgb stable   Hypokalemia Replaced - Mg is ok  Tachycardia Likely due to low grade fever and pain - has improved - given clinical stability and young age, will d/c tele   Dispo -CONTINUE negative pressure isolation. -ID recommended for another AFB sputum smear to  rule out if the patient is a contagious or not.   LOS  12 days  Clint Lipps Pager: [phone] 03/22/2012, 2:09 PM

## 2012-03-22 NOTE — Progress Notes (Signed)
   CARE MANAGEMENT NOTE 03/22/2012  Patient:  Kelsey Rodriguez, Kelsey Rodriguez   Account Number:  [id number]  Date Initiated:  03/11/2012  Documentation initiated by:  Kelsey Rodriguez  Subjective/Objective Assessment:   Pt admitted with chest pain, ?mediastinal mass- workup in progress     Action/Plan:   PTA pt lived at home with parent, was independent with ADLs   Anticipated DC Date:  03/23/2012   Anticipated DC Plan:  HOME/SELF CARE      DC Planning Services  CM consult  [facility]igent [facility]  Medication Assistance      Choice offered to / List presented to:             Status of service:  In process, will continue to follow Medicare Important Message given?   (If response is "NO", the following Medicare IM given date fields will be blank) Date Medicare IM given:   Date Additional Medicare IM given:    Discharge Disposition:    Per UR Regulation:    If discussed at Long Length of Stay Meetings, dates discussed:    Comments:  03/22/12 11:52 Kelsey Cape RN, BSN [phone] patient's  mother Case Manager Kelsey Rodriguez called and wanted to speak with me, she states they brought patient to hospital, she states patient tells her she has TB.  I asked what did she need to talk to me about she states she wanted to know what was wrong with patient , informed her that if patient told her she has TB thats what she has ,  She states she will go in and talk to the patient.  I asked the Case Manager how did patient get her medications she states she does not know.  Patient will be followed up by infectious disease.  Patient has transportation, her mother will be able to take her home at discharge.  03/16/12- 1145- Kelsey Pierini RN, BSN [phone] Pt for OR today for  L anterior mediastinotomy and biopsy of mediastinal mass today- to go to 2300 post op- CM to follow- plan to return home with mom.  03/11/12- 1500- Kelsey Pierini RN BSN [phone] Pt off unit for test- CM to follow for discharge needs- work up in  progress- surgical eval pending

## 2012-03-22 NOTE — Progress Notes (Signed)
Nutrition Follow-up  Diet Order:  Regular, Ensure is no longer ordered PO intake is decreased, meals not always documented. States that she has had decreased appetite, but does continue to drink up to 2 Ensure daily when provided.    Meds: Scheduled Meds:   . alteplase  2 mg Intracatheter Once  . bisacodyl  10 mg Oral Daily  . ethambutol  1,000 mg Oral Daily  . isoniazid  300 mg Oral Daily  . pantoprazole  40 mg Oral Q0600  . pyrazinamide  1,500 mg Oral Daily  . pyridOXINE  50 mg Oral Daily  . rifabutin  300 mg Oral Daily  . sulfamethoxazole-trimethoprim  20 mL Oral Daily  . valACYclovir  1,000 mg Oral BID   Continuous Infusions:   . sodium chloride 20 mL (03/21/12 1535)   PRN Meds:.acetaminophen, acetaminophen, lidocaine, morphine injection, ondansetron (ZOFRAN) IV, oxyCODONE, promethazine, senna-docusate, sodium chloride  Labs:  CMP     Component Value Date/Time   NA 136 03/20/2012 0348   K 4.7 03/20/2012 0348   CL 103 03/20/2012 0348   CO2 24 03/20/2012 0348   GLUCOSE 92 03/20/2012 0348   BUN 4* 03/20/2012 0348   CREATININE 0.58 03/20/2012 0348   CALCIUM 9.2 03/20/2012 0348   PROT 6.3 03/18/2012 0430   ALBUMIN 2.5* 03/18/2012 0430   AST 17 03/18/2012 0430   ALT 8 03/18/2012 0430   ALKPHOS 65 03/18/2012 0430   BILITOT 0.2* 03/18/2012 0430   GFRNONAA >90 03/20/2012 0348   GFRAA >90 03/20/2012 0348     Intake/Output Summary (Last 24 hours) at 03/22/12 1053 Last data filed at 03/21/12 1700  Gross per 24 hour  Intake    735 ml  Output      0 ml  Net    735 ml    Weight Status:  128 lbs per bed scale, weighed by RD at time of visit, weight is stable.   Re-estimated needs:  1800-2000 kcal, 70-80 gm protein.   Nutrition Dx:  Inadequate oral intake, ongoing  Goal:  PO intake will continue to >75% of most meals, unmet  Intervention:  RD will reorder Ensure Complete BID  RD will continue to follow pt  Monitor:  PO intake, weight, labs, I/O's,    Rudean Haskell Pager #:   [phone]

## 2012-03-23 MED ORDER — VALACYCLOVIR HCL 500 MG PO TABS
1000.0000 mg | ORAL_TABLET | Freq: Three times a day (TID) | ORAL | Status: DC
  Administered 2012-03-23 – 2012-03-25 (×7): 1000 mg via ORAL
  Filled 2012-03-23 (×8): qty 2

## 2012-03-23 NOTE — Progress Notes (Signed)
[facility] [place] TEAM 1 - Stepdown/ICU TEAM  Subjective: She denies any shortness of breath or any other complaints.  Interim Summary [name] originally from [place] (speaks and understands 52 - 75% English) w/ newly diagnosed HIV/AIDS, CD 4 count of 20, VL 230k, WB+ confirmation. Her mother is aware of her HIV status, but she prefers that no one else know. 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 Patient was also noted to have large pulmonary/mediastinal mass of unknown etiology. She was taken for biopsy of the mediatinal mass via [place] procedure by TCTS 4/2 -with results now suggesting TB as the etiology.  Her postop course has been without significant new developments.  The ID service is following along with [place] and is making the vast majority of tx decisions at this time.  She remains on negative pressure isolation, while we attempt to obtain sputum samples wch will confirm the safety of stopping this isolation.  ID is also awaiting studied to confirm if this is TB vs/ NTM.  Additionally, ID is making the necessary arrangements to assure that the pt is provided with her HIV and OI prohphy meds, along with her TB tx as an outpt.  The decision on D/C timing will be made by the ID service.    PROCEDURES:  3/28 - Upper Endoscopy with biopsies of the esophagus 4/02 - Left Chamberlain procedure (anterior mediastinotomy and  biopsy)  Objective: Weight change:   Intake/Output Summary (Last 24 hours) at 03/23/12 1152 Last data filed at 03/23/12 1019  Gross per 24 hour  Intake    960 ml  Output      0 ml  Net    960 ml   Blood pressure 101/69, pulse 100, temperature 98.5 F (36.9 C), temperature source Oral, resp. rate 20, height 5\' 5"  (1.651 m), weight 57.607 kg (127 lb), last menstrual period 02/21/2012, SpO2 99.00%.  CBG (last 3)  No results found for this basename: GLUCAP:3 in the last 72 hours  Physical  Exam: General: No acute respiratory distress Lungs: Clear to auscultation bilaterally without wheezes or crackles Cardiovascular: Regular rate and rhythm without murmur gallop or rub  Abdomen: Nontender, nondistended, soft, bowel sounds positive, no rebound, no ascites, no appreciable mass Extremities: No significant cyanosis, clubbing, or edema bilateral LE  Lab Results: No results found for this basename: NA:3,K:3,CL:3,CO2:3,GLUCOSE:3,BUN:3,CREATININE:3,CALCIUM:3,MG:3,PHOS:3 in the last 72 hours No results found for this basename: WBC:3,NEUTROABS:3,HGB:3,HCT:3,MCV:3,PLT:3 in the last 72 hours Studies/Results: All recent x-ray/radiology reports have been reviewed in detail.   Medications: I have reviewed the patient's complete medication list.  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 -ID to determine when resp isolation can be safely d/c'ed   Nausea w/ vomiting  -Now resolved - was temporally related to azithro dosing - ID has stopped azithro for now   HSV Espohagitis/Odynophagia  -HSV culture positive - on Valacyclovir 1g TID - course of tx to be determined by ID  Leukopenia felt to be related to HIV- resolved  Thrombocytopenia resolved  Positive titers for Toxoplasma without acute infection Bactrim for prophylaxis per ID  HIV+/AIDS -CD4 count 20 - VL 200k - ID following and will guide initiation of ART once TB tx initiated (plan is for 10-14 days of TB tx before ART) -Remains on prophylactic Septra  Normocytic anemia Due to  above - Hgb stable   Hypokalemia Replaced - Mg is ok  Tachycardia Likely due to low grade fever and pain - has improved - given clinical stability and young age, will d/c tele   Dispo -CONTINUE negative pressure isolation. -ID recommended for another AFB sputum smear, before discharge.   LOS  13  days  Clint Lipps Pager: [phone] 03/23/2012, 11:52 AM

## 2012-03-24 MED ORDER — FLUCONAZOLE 100 MG PO TABS
100.0000 mg | ORAL_TABLET | Freq: Every day | ORAL | Status: DC
  Administered 2012-03-25: 100 mg via ORAL
  Filled 2012-03-24: qty 1

## 2012-03-24 MED ORDER — AZITHROMYCIN 600 MG PO TABS
1200.0000 mg | ORAL_TABLET | ORAL | Status: DC
  Administered 2012-03-24: 1200 mg via ORAL
  Filled 2012-03-24: qty 2

## 2012-03-24 MED ORDER — SODIUM CHLORIDE 3 % IN NEBU
15.0000 mL | INHALATION_SOLUTION | Freq: Once | RESPIRATORY_TRACT | Status: AC | PRN
  Filled 2012-03-24 (×2): qty 15

## 2012-03-24 MED ORDER — FLUCONAZOLE 200 MG PO TABS
200.0000 mg | ORAL_TABLET | Freq: Once | ORAL | Status: AC
  Administered 2012-03-24: 200 mg via ORAL
  Filled 2012-03-24: qty 1

## 2012-03-24 NOTE — Progress Notes (Signed)
[facility] [place] TEAM 3  Subjective: She denies any shortness of breath or any other complaints.  Interim Summary [name] originally from [place] (speaks and understands 32 - 75% English) w/ newly diagnosed HIV/AIDS, CD 4 count of 20, VL 230k, WB+ confirmation. Her mother is aware of her HIV status, but she prefers that no one else know. 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 Patient was also noted to have large pulmonary/mediastinal mass of unknown etiology. She was taken for biopsy of the mediatinal mass via [place] procedure by [facility] 4/2 -with results now suggesting TB as the etiology.  Her postop course has been without significant new developments.  The ID service is following along with [place] and is making the vast majority of tx decisions at this time.  She remains on negative pressure isolation, while we attempt to obtain sputum samples wch will confirm the safety of stopping this isolation.  ID is also awaiting studied to confirm if this is TB vs/ NTM.  Additionally, ID is making the necessary arrangements to assure that the pt is provided with her HIV and OI prohphy meds, along with her TB tx as an outpt.  The decision on D/C timing will be made by the ID service.    PROCEDURES:  3/28 - Upper Endoscopy with biopsies of the esophagus 4/02 - Left Chamberlain procedure (anterior mediastinotomy and  biopsy)  Objective: Weight change:   Intake/Output Summary (Last 24 hours) at 03/24/12 1353 Last data filed at 03/23/12 2300  Gross per 24 hour  Intake   1760 ml  Output      0 ml  Net   1760 ml   Blood pressure 118/71, pulse 106, temperature 98.7 F (37.1 C), temperature source Oral, resp. rate 20, height 5\' 5"  (1.651 m), weight 55.6 kg (122 lb 9.2 oz), last menstrual period 02/21/2012, SpO2 99.00%.  CBG (last 3)  No results found for this basename: GLUCAP:3 in the last 72 hours  Physical Exam: General: No  acute respiratory distress Lungs: Clear to auscultation bilaterally without wheezes or crackles Cardiovascular: Regular rate and rhythm without murmur gallop or rub  Abdomen: Nontender, nondistended, soft, bowel sounds positive, no rebound, no ascites, no appreciable mass Extremities: No significant cyanosis, clubbing, or edema bilateral LE  Lab Results: No results found for this basename: NA:3,K:3,CL:3,CO2:3,GLUCOSE:3,BUN:3,CREATININE:3,CALCIUM:3,MG:3,PHOS:3 in the last 72 hours No results found for this basename: WBC:3,NEUTROABS:3,HGB:3,HCT:3,MCV:3,PLT:3 in the last 72 hours Studies/Results: All recent x-ray/radiology reports have been reviewed in detail.   Medications: I have reviewed the patient's complete medication list.  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 -spoke with Dr Zenaida Niece Dam-Health Dept needs to be notified, no further sputum AFB's needed-as cannot be induced by RT. To discharge on current anti TB meds. Continue with resp isolation while patient here.  Nausea w/ vomiting  -Now resolved - was temporally related to azithro dosing - ID now recommending to restart Zithromax  HSV Espohagitis/Odynophagia  -HSV culture positive - on Valacyclovir 1g TID - course of tx to be determined by ID-spoke wit Dr Zenaida Niece [place]-total duration of therapy to be 14 days, will d/w pharmacy to see how many days of treatment she has had so far (including Ganciclovir)  Leukopenia felt to be related to HIV- resolved  Thrombocytopenia resolved  Positive titers for Toxoplasma without  acute infection Bactrim for prophylaxis per ID  HIV+/AIDS -CD4 count 20 - VL 200k - ID following and will guide initiation of ART once TB tx initiated (plan is for 10-14 days of TB tx before ART) -Remains on prophylactic Septra  Normocytic anemia Due to above - Hgb stable    Hypokalemia Replaced - Mg is ok  Tachycardia Likely due to low grade fever and pain - has improved - given clinical stability and young age, will d/c tele   Dispo -home in am  LOS  14 days  S Cuca Benassi MD 03/24/2012, 1:53 PM

## 2012-03-24 NOTE — Progress Notes (Addendum)
Subjective: No new complaints, feels better   Antibiotics:  Anti-infectives     Start     Dose/Rate Route Frequency Ordered Stop   03/24/12 1600   azithromycin (ZITHROMAX) tablet 1,200 mg        1,200 mg Oral Weekly 03/24/12 1413     03/23/12 1600   valACYclovir (VALTREX) tablet 1,000 mg        1,000 mg Oral 3 times daily 03/23/12 1129     03/18/12 1130   rifabutin (MYCOBUTIN) capsule 300 mg        300 mg Oral Daily 03/18/12 1011     03/18/12 1130   pyrazinamide tablet 1,500 mg        1,500 mg Oral Daily 03/18/12 1011     03/18/12 1130   isoniazid (NYDRAZID) tablet 300 mg        300 mg Oral Daily 03/18/12 1011     03/18/12 1130   ethambutol (MYAMBUTOL) tablet 1,000 mg        1,000 mg Oral Daily 03/18/12 1011     03/17/12 1530   valACYclovir (VALTREX) tablet 1,000 mg  Status:  Discontinued        1,000 mg Oral 2 times daily 03/17/12 1527 03/23/12 1129   03/17/12 0830   cefUROXime (ZINACEF) injection 1.5 g  Status:  Discontinued        1.5 g Intramuscular Every 12 hours 03/17/12 0812 03/17/12 0821   03/17/12 0830   cefUROXime (ZINACEF) 1.5 g in dextrose 5 % 50 mL IVPB  Status:  Discontinued        1.5 g 100 mL/hr over 30 Minutes Intravenous Every 12 hours 03/17/12 0821 03/17/12 0821   03/17/12 0830   cefUROXime (ZINACEF) 1.5 g in dextrose 5 % 50 mL IVPB        1.5 g 100 mL/hr over 30 Minutes Intravenous Every 12 hours 03/17/12 0821 03/18/12 1535   03/16/12 0600   cefUROXime (ZINACEF) 1.5 g in dextrose 5 % 50 mL IVPB  Status:  Discontinued        1.5 g 100 mL/hr over 30 Minutes Intravenous 60 min pre-op 03/15/12 1916 03/16/12 1950   03/14/12 1000   azithromycin (ZITHROMAX) 200 MG/5ML suspension 1,200 mg  Status:  Discontinued        1,200 mg Oral Daily 03/13/12 1026 03/13/12 1041   03/13/12 1130  sulfamethoxazole-trimethoprim (BACTRIM,SEPTRA) 200-40 MG/5ML suspension 20 mL       20 mL Oral Daily 03/13/12 1026     03/13/12 1130   azithromycin (ZITHROMAX) 200 MG/5ML  suspension 1,200 mg  Status:  Discontinued        1,200 mg Oral Weekly 03/13/12 1041 03/20/12 1334   03/12/12 2100   ganciclovir (CYTOVENE) 290 mg in sodium chloride 0.9 % 100 mL IVPB  Status:  Discontinued        5 mg/kg  57.6 kg 100 mL/hr over 60 Minutes Intravenous Every 12 hours 03/12/12 1457 03/17/12 1123   03/12/12 1300   ganciclovir (CYTOVENE) 290 mg in sodium chloride 0.9 % 100 mL IVPB  Status:  Discontinued        5 mg/kg  57.6 kg 100 mL/hr over 60 Minutes Intravenous Every 12 hours 03/12/12 1150 03/12/12 1457          Medications: Scheduled Meds:    . alteplase  2 mg Intracatheter Once  . azithromycin  1,200 mg Oral Weekly  . bisacodyl  10 mg Oral Daily  . ethambutol  1,000 mg  Oral Daily  . feeding supplement  237 mL Oral BID BM  . isoniazid  300 mg Oral Daily  . pantoprazole  40 mg Oral Q0600  . pyrazinamide  1,500 mg Oral Daily  . pyridOXINE  50 mg Oral Daily  . rifabutin  300 mg Oral Daily  . sulfamethoxazole-trimethoprim  20 mL Oral Daily  . valACYclovir  1,000 mg Oral TID   Continuous Infusions:  PRN Meds:.acetaminophen, acetaminophen, lidocaine, morphine injection, ondansetron (ZOFRAN) IV, oxyCODONE, promethazine, senna-docusate, sodium chloride, sodium chloride HYPERTONIC   Objective: Weight change:   Intake/Output Summary (Last 24 hours) at 03/24/12 2157 Last data filed at 03/24/12 2100  Gross per 24 hour  Intake    960 ml  Output      0 ml  Net    960 ml   Blood pressure 108/74, pulse 103, temperature 97.4 F (36.3 C), temperature source Oral, resp. rate 16, height 5\' 5"  (1.651 m), weight 122 lb 9.2 oz (55.6 kg), last menstrual period 02/21/2012, SpO2 100.00%. Temp:  [97.4 F (36.3 C)-98.7 F (37.1 C)] 97.4 F (36.3 C) (04/10 2129) Pulse Rate:  [103-106] 103  (04/10 2129) Resp:  [16-20] 16  (04/10 2129) BP: (102-118)/(68-74) 108/74 mmHg (04/10 2129) SpO2:  [97 %-100 %] 100 % (04/10 2129) Weight:  [122 lb 9.2 oz (55.6 kg)] 122 lb 9.2 oz  (55.6 kg) (04/10 0981)  Physical Exam: General: Alert and awake, oriented x3, not in any acute distress. HEENT: anicteric sclera, pupils reactive to light and accommodation, EOMI CVS regular rate, normal r,  no murmur rubs or gallops Chest: fairly clear to auscultation bilaterally, no wheezing, rales or rhonchi Abdomen: soft nontender, nondistended, normal bowel sounds, Extremities: no  clubbing or edema noted bilaterally Neuro: nonfocal  Lab Results: No results found for this basename: WBC:2,HGB:2,HCT:2,PLT:2 in the last 72 hours  BMET No results found for this basename: NA:2,K:2,CL:2,CO2:2,GLUCOSE:2,BUN:2,CREATININE:2,CALCIUM:2 in the last 72 hours  Micro Results: Recent Results (from the past 240 hour(s))  SURGICAL PCR SCREEN     Status: Normal   Collection Time   03/16/12 12:20 PM      Component Value Range Status Comment   MRSA, PCR NEGATIVE  NEGATIVE  Final    Staphylococcus aureus NEGATIVE  NEGATIVE  Final   AFB CULTURE WITH SMEAR     Status: Normal (Preliminary result)   Collection Time   03/16/12  5:24 PM      Component Value Range Status Comment   Specimen Description TISSUE   Final    Special Requests     Final    Value: PT ON ZINACEF,AZITHROMYCIN,CYTOVENE,SEPTRA MEDIASTINAL MASS   ACID FAST SMEAR     Final    Value: 4+ ACID FAST BACILLI SEEN CRITICAL RESULT CALLED TO, READ BACK BY AND VERIFIED WITH: DR Jerolyn Center 14:15 03/17/12 GF   Culture     Final    Value: CULTURE WILL BE EXAMINED FOR 6 WEEKS BEFORE ISSUING A FINAL REPORT   Report Status PENDING   Incomplete   FUNGUS CULTURE W SMEAR     Status: Normal (Preliminary result)   Collection Time   03/16/12  5:24 PM      Component Value Range Status Comment   Specimen Description TISSUE   Final    Special Requests     Final    Value: PT ON ZINACEF,AZITHROMYCIN,CYTOVENE,SEPTRA MEDIASTINAL MASS   Fungal Smear NO YEAST OR FUNGAL ELEMENTS SEEN   Final    Culture CULTURE IN PROGRESS FOR FOUR WEEKS  Final    Report  Status PENDING   Incomplete   CULTURE, BLOOD (ROUTINE X 2)     Status: Normal (Preliminary result)   Collection Time   03/19/12 12:35 PM      Component Value Range Status Comment   Specimen Description BLOOD LEFT ARM   Final    Special Requests BOTTLES DRAWN AEROBIC AND ANAEROBIC 10CC   Final    Culture  Setup Time [id number]   Final    Culture     Final    Value:        BLOOD CULTURE RECEIVED NO GROWTH TO DATE CULTURE WILL BE HELD FOR 5 DAYS BEFORE ISSUING A FINAL NEGATIVE REPORT   Report Status PENDING   Incomplete   CULTURE, BLOOD (ROUTINE X 2)     Status: Normal (Preliminary result)   Collection Time   03/19/12 12:50 PM      Component Value Range Status Comment   Specimen Description BLOOD LEFT HAND   Final    Special Requests BOTTLES DRAWN AEROBIC AND ANAEROBIC 10CC   Final    Culture  Setup Time [id number]   Final    Culture     Final    Value:        BLOOD CULTURE RECEIVED NO GROWTH TO DATE CULTURE WILL BE HELD FOR 5 DAYS BEFORE ISSUING A FINAL NEGATIVE REPORT   Report Status PENDING   Incomplete   AFB CULTURE WITH SMEAR     Status: Normal (Preliminary result)   Collection Time   03/19/12  3:46 PM      Component Value Range Status Comment   Specimen Description SPUTUM   Final    Special Requests Immunocompromised   Final    ACID FAST SMEAR     Final    Value: SUSPICIOUS AFB SMEAR (1-2 ORGANISMS SEEN ON THE ENTIRE SLIDE;PLEASE SUBMIT ADDITIONAL SPECIMENS).   Culture     Final    Value: CULTURE WILL BE EXAMINED FOR 6 WEEKS BEFORE ISSUING A FINAL REPORT   Report Status PENDING   Incomplete   URINE CULTURE     Status: Normal   Collection Time   03/19/12  3:47 PM      Component Value Range Status Comment   Specimen Description URINE, CLEAN CATCH   Final    Special Requests Immunocompromised   Final    Culture  Setup Time [id number]   Final    Colony Count NO GROWTH   Final    Culture NO GROWTH   Final    Report Status 03/21/2012 FINAL   Final     Studies/Results: No  results found.    Assessment/Plan: Kelsey Rodriguez is a 21 y.o. 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  1) TB: Pt unable to produce sputum after RT attempt at re-induction --continue RIPE --[facility] aware of pt, will fu with [facility] and continue DOT and further sputum attempts per [facility]  2) HIV: ADAP was approved --will plan on isentress and truvada beign started after at least 10-14 days of TB rx -restart azithromycin weekly for OI prophylaxis --continue bactrim  3) HSV esophagitis: --continue valtrex tid x 14 days total antiviral therapy  4) THrush: pt with thrush on tongue" --14 day course of diflucan Disp:   PATIENT HAS FOLLOWUP APPT ON Thursday April 18TH WITH DR. Orvan Falconer AT 2PM IN [facility]  [address] [facility] [address]ILDING [address]   LOS: 14 days  Acey Lav 03/24/2012, 9:57 PM

## 2012-03-24 NOTE — Progress Notes (Signed)
   CARE MANAGEMENT NOTE 03/24/2012  Patient:  Kelsey Rodriguez, Kelsey Rodriguez   Account Number:  [id number]  Date Initiated:  03/11/2012  Documentation initiated by:  Donn Pierini  Subjective/Objective Assessment:   Pt admitted with chest pain, ?mediastinal mass- workup in progress     Action/Plan:   PTA pt lived at home with parent, was independent with ADLs   Anticipated DC Date:  03/25/2012   Anticipated DC Plan:  HOME/SELF CARE      DC Planning Services  CM consult      Choice offered to / List presented to:             Status of service:  In process, will continue to follow Medicare Important Message given?   (If response is "NO", the following Medicare IM given date fields will be blank) Date Medicare IM given:   Date Additional Medicare IM given:    Discharge Disposition:    Per UR Regulation:    If discussed at Long Length of Stay Meetings, dates discussed:   03/24/2012    Comments:  03/24/12 15:02 Letha Cape RN, BSN [phone] patient is set up with the [facility] Dept for TB Dx, her home RN will be Rebecca Eaton [phone].  They will need a dc summary faxed to [phone] when patient is discharged.  Patient will also be followed by infectious disease.   03/22/12 11:52 Letha Cape RN, BSN [phone] patient's  mother Case Manager Vidal Schwalbe called and wanted to speak with me, she states they brought patient to hospital, she states patient tells her she has TB.  I asked what did she need to talk to me about she states she wanted to know what was wrong with patient , informed her that if patient told her she has TB thats what she has ,  She states she will go in and talk to the patient.  I asked the Case Manager how did patient get her medications she states she does not know.  Patient will be followed up by infectious disease.  Patient has transportation, her mother will be able to take her home at discharge.  03/16/12- 1145- Donn Pierini RN, BSN [phone] Pt for OR today for   L anterior mediastinotomy and biopsy of mediastinal mass today- to go to 2300 post op- CM to follow- plan to return home with mom.  03/11/12- 1500- Donn Pierini RN BSN [phone] Pt off unit for test- CM to follow for discharge needs- work up in progress- surgical eval pending

## 2012-03-25 LAB — CULTURE, BLOOD (ROUTINE X 2)
Culture  Setup Time: 201304051632
Culture  Setup Time: 201304051633
Culture: NO GROWTH
Culture: NO GROWTH

## 2012-03-25 MED ORDER — ETHAMBUTOL HCL 100 MG PO TABS: 1000.0000 mg | ORAL_TABLET | Freq: Every day | ORAL | Status: DC

## 2012-03-25 MED ORDER — PYRAZINAMIDE 500 MG PO TABS: 1500.0000 mg | ORAL_TABLET | Freq: Every day | ORAL | Status: DC

## 2012-03-25 MED ORDER — RIFABUTIN 150 MG PO CAPS: 300.0000 mg | ORAL_CAPSULE | Freq: Every day | ORAL | Status: DC

## 2012-03-25 MED ORDER — ENSURE COMPLETE PO LIQD: 237.0000 mL | Freq: Two times a day (BID) | ORAL | Status: DC

## 2012-03-25 MED ORDER — AZITHROMYCIN 600 MG PO TABS: 1200.0000 mg | ORAL_TABLET | ORAL | Status: DC

## 2012-03-25 MED ORDER — FLUCONAZOLE 100 MG PO TABS: 100.0000 mg | ORAL_TABLET | Freq: Every day | ORAL | Status: AC

## 2012-03-25 MED ORDER — VALACYCLOVIR HCL 1 G PO TABS: 1000.0000 mg | ORAL_TABLET | Freq: Three times a day (TID) | ORAL | Status: DC

## 2012-03-25 MED ORDER — SULFAMETHOXAZOLE-TRIMETHOPRIM 200-40 MG/5ML PO SUSP: 20.0000 mL | Freq: Every day | ORAL | Status: AC

## 2012-03-25 MED ORDER — PYRIDOXINE HCL 50 MG PO TABS: 50.0000 mg | ORAL_TABLET | Freq: Every day | ORAL | Status: DC

## 2012-03-25 MED ORDER — ISONIAZID 300 MG PO TABS: 300.0000 mg | ORAL_TABLET | Freq: Every day | ORAL | Status: AC

## 2012-03-25 NOTE — Progress Notes (Signed)
Reviewed discharge instruction with patient and mother no question or concerns verbalized, Patient received funded medication and specific instruction to Follow with the health dept. 

## 2012-03-25 NOTE — Progress Notes (Signed)
Subjective: Feeling better   Antibiotics:  Anti-infectives     Start     Dose/Rate Route Frequency Ordered Stop   03/25/12 1000   fluconazole (DIFLUCAN) tablet 100 mg        100 mg Oral Daily 03/24/12 2200 04/07/12 0959   03/24/12 2230   fluconazole (DIFLUCAN) tablet 200 mg        200 mg Oral  Once 03/24/12 2200 03/24/12 2233   03/24/12 1600   azithromycin (ZITHROMAX) tablet 1,200 mg        1,200 mg Oral Weekly 03/24/12 1413     03/23/12 1600   valACYclovir (VALTREX) tablet 1,000 mg        1,000 mg Oral 3 times daily 03/23/12 1129     03/18/12 1130   rifabutin (MYCOBUTIN) capsule 300 mg        300 mg Oral Daily 03/18/12 1011     03/18/12 1130   pyrazinamide tablet 1,500 mg        1,500 mg Oral Daily 03/18/12 1011     03/18/12 1130   isoniazid (NYDRAZID) tablet 300 mg        300 mg Oral Daily 03/18/12 1011     03/18/12 1130   ethambutol (MYAMBUTOL) tablet 1,000 mg        1,000 mg Oral Daily 03/18/12 1011     03/17/12 1530   valACYclovir (VALTREX) tablet 1,000 mg  Status:  Discontinued        1,000 mg Oral 2 times daily 03/17/12 1527 03/23/12 1129   03/17/12 0830   cefUROXime (ZINACEF) injection 1.5 g  Status:  Discontinued        1.5 g Intramuscular Every 12 hours 03/17/12 0812 03/17/12 0821   03/17/12 0830   cefUROXime (ZINACEF) 1.5 g in dextrose 5 % 50 mL IVPB  Status:  Discontinued        1.5 g 100 mL/hr over 30 Minutes Intravenous Every 12 hours 03/17/12 0821 03/17/12 0821   03/17/12 0830   cefUROXime (ZINACEF) 1.5 g in dextrose 5 % 50 mL IVPB        1.5 g 100 mL/hr over 30 Minutes Intravenous Every 12 hours 03/17/12 0821 03/18/12 1535   03/16/12 0600   cefUROXime (ZINACEF) 1.5 g in dextrose 5 % 50 mL IVPB  Status:  Discontinued        1.5 g 100 mL/hr over 30 Minutes Intravenous 60 min pre-op 03/15/12 1916 03/16/12 1950   03/14/12 1000   azithromycin (ZITHROMAX) 200 MG/5ML suspension 1,200 mg  Status:  Discontinued        1,200 mg Oral Daily 03/13/12 1026  03/13/12 1041   03/13/12 1130  sulfamethoxazole-trimethoprim (BACTRIM,SEPTRA) 200-40 MG/5ML suspension 20 mL       20 mL Oral Daily 03/13/12 1026     03/13/12 1130   azithromycin (ZITHROMAX) 200 MG/5ML suspension 1,200 mg  Status:  Discontinued        1,200 mg Oral Weekly 03/13/12 1041 03/20/12 1334   03/12/12 2100   ganciclovir (CYTOVENE) 290 mg in sodium chloride 0.9 % 100 mL IVPB  Status:  Discontinued        5 mg/kg  57.6 kg 100 mL/hr over 60 Minutes Intravenous Every 12 hours 03/12/12 1457 03/17/12 1123   03/12/12 1300   ganciclovir (CYTOVENE) 290 mg in sodium chloride 0.9 % 100 mL IVPB  Status:  Discontinued        5 mg/kg  57.6 kg 100 mL/hr over 60 Minutes Intravenous Every  12 hours 03/12/12 1150 03/12/12 1457          Medications: Scheduled Meds:    . alteplase  2 mg Intracatheter Once  . azithromycin  1,200 mg Oral Weekly  . bisacodyl  10 mg Oral Daily  . ethambutol  1,000 mg Oral Daily  . feeding supplement  237 mL Oral BID BM  . fluconazole  100 mg Oral Daily  . fluconazole  200 mg Oral Once  . isoniazid  300 mg Oral Daily  . pantoprazole  40 mg Oral Q0600  . pyrazinamide  1,500 mg Oral Daily  . pyridOXINE  50 mg Oral Daily  . rifabutin  300 mg Oral Daily  . sulfamethoxazole-trimethoprim  20 mL Oral Daily  . valACYclovir  1,000 mg Oral TID   Continuous Infusions:  PRN Meds:.acetaminophen, acetaminophen, lidocaine, morphine injection, ondansetron (ZOFRAN) IV, oxyCODONE, promethazine, senna-docusate, sodium chloride, sodium chloride HYPERTONIC   Objective: Weight change:   Intake/Output Summary (Last 24 hours) at 03/25/12 1109 Last data filed at 03/25/12 0600  Gross per 24 hour  Intake    880 ml  Output      0 ml  Net    880 ml   Blood pressure 105/71, pulse 85, temperature 98.2 F (36.8 C), temperature source Oral, resp. rate 14, height 5\' 5"  (1.651 m), weight 122 lb 9.2 oz (55.6 kg), last menstrual period 02/21/2012, SpO2 100.00%. Temp:  [97.4 F  (36.3 C)-98.2 F (36.8 C)] 98.2 F (36.8 C) (04/11 0550) Pulse Rate:  [85-106] 85  (04/11 0550) Resp:  [14-19] 14  (04/11 0550) BP: (102-108)/(68-74) 105/71 mmHg (04/11 0550) SpO2:  [97 %-100 %] 100 % (04/11 0550)  Physical Exam: General: Alert and awake, oriented x3, not in any acute distress. HEENT: anicteric sclera, pupils reactive to light and accommodation, EOMI CVS regular rate, normal r,  no murmur rubs or gallops Chest: fairly clear to auscultation bilaterally, no wheezing, rales or rhonchi Abdomen: soft nontender, nondistended, normal bowel sounds, Extremities: no  clubbing or edema noted bilaterally Neuro: nonfocal  Lab Results: No results found for this basename: WBC:2,HGB:2,HCT:2,PLT:2 in the last 72 hours  BMET No results found for this basename: NA:2,K:2,CL:2,CO2:2,GLUCOSE:2,BUN:2,CREATININE:2,CALCIUM:2 in the last 72 hours  Micro Results: Recent Results (from the past 240 hour(s))  SURGICAL PCR SCREEN     Status: Normal   Collection Time   03/16/12 12:20 PM      Component Value Range Status Comment   MRSA, PCR NEGATIVE  NEGATIVE  Final    Staphylococcus aureus NEGATIVE  NEGATIVE  Final   AFB CULTURE WITH SMEAR     Status: Normal (Preliminary result)   Collection Time   03/16/12  5:24 PM      Component Value Range Status Comment   Specimen Description TISSUE   Final    Special Requests     Final    Value: PT ON ZINACEF,AZITHROMYCIN,CYTOVENE,SEPTRA MEDIASTINAL MASS   ACID FAST SMEAR     Final    Value: 4+ ACID FAST BACILLI SEEN CRITICAL RESULT CALLED TO, READ BACK BY AND VERIFIED WITH: DR Jerolyn Center 14:15 03/17/12 GF   Culture     Final    Value: CULTURE WILL BE EXAMINED FOR 6 WEEKS BEFORE ISSUING A FINAL REPORT   Report Status PENDING   Incomplete   FUNGUS CULTURE W SMEAR     Status: Normal (Preliminary result)   Collection Time   03/16/12  5:24 PM      Component Value Range Status Comment  Specimen Description TISSUE   Final    Special Requests     Final      Value: PT ON ZINACEF,AZITHROMYCIN,CYTOVENE,SEPTRA MEDIASTINAL MASS   Fungal Smear NO YEAST OR FUNGAL ELEMENTS SEEN   Final    Culture CULTURE IN PROGRESS FOR FOUR WEEKS   Final    Report Status PENDING   Incomplete   CULTURE, BLOOD (ROUTINE X 2)     Status: Normal   Collection Time   03/19/12 12:35 PM      Component Value Range Status Comment   Specimen Description BLOOD LEFT ARM   Final    Special Requests BOTTLES DRAWN AEROBIC AND ANAEROBIC 10CC   Final    Culture  Setup Time [id number]   Final    Culture NO GROWTH 5 DAYS   Final    Report Status 03/25/2012 FINAL   Final   CULTURE, BLOOD (ROUTINE X 2)     Status: Normal   Collection Time   03/19/12 12:50 PM      Component Value Range Status Comment   Specimen Description BLOOD LEFT HAND   Final    Special Requests BOTTLES DRAWN AEROBIC AND ANAEROBIC 10CC   Final    Culture  Setup Time [id number]   Final    Culture NO GROWTH 5 DAYS   Final    Report Status 03/25/2012 FINAL   Final   AFB CULTURE WITH SMEAR     Status: Normal (Preliminary result)   Collection Time   03/19/12  3:46 PM      Component Value Range Status Comment   Specimen Description SPUTUM   Final    Special Requests Immunocompromised   Final    ACID FAST SMEAR     Final    Value: SUSPICIOUS AFB SMEAR (1-2 ORGANISMS SEEN ON THE ENTIRE SLIDE;PLEASE SUBMIT ADDITIONAL SPECIMENS).   Culture     Final    Value: CULTURE WILL BE EXAMINED FOR 6 WEEKS BEFORE ISSUING A FINAL REPORT   Report Status PENDING   Incomplete   URINE CULTURE     Status: Normal   Collection Time   03/19/12  3:47 PM      Component Value Range Status Comment   Specimen Description URINE, CLEAN CATCH   Final    Special Requests Immunocompromised   Final    Culture  Setup Time [id number]   Final    Colony Count NO GROWTH   Final    Culture NO GROWTH   Final    Report Status 03/21/2012 FINAL   Final     Studies/Results: No results found.    Assessment/Plan: Kelsey Rodriguez is a 21 y.o. 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  1) TB:  --continue RIPE --[facility] aware of pt, will fu with [facility] and continue DOT and further sputum attempts per [facility]  2) HIV: ADAP was approved --will plan on isentress and truvada beign started after at least 10-14 days of TB rx -restart azithromycin weekly for OI prophylaxis --continue bactrim  3) HSV esophagitis: --continue valtrex tid x 14 days total antiviral therapy  4) THrush: pt with thrush on tongue" --14 day course of diflucan Disp:   PATIENT HAS FOLLOWUP APPT ON Thursday April 18TH WITH DR. Orvan Falconer AT 2PM IN [facility]  [address] [facility] MEDICAL BUILDING [address]   LOS: 15 days   Acey Lav 03/25/2012, 11:09 AM

## 2012-03-25 NOTE — Progress Notes (Signed)
   CARE MANAGEMENT NOTE 03/25/2012  Patient:  Kelsey Rodriguez,Kelsey Rodriguez   Account Number:  [id number]  Date Initiated:  03/11/2012  Documentation initiated by:  Donn Pierini  Subjective/Objective Assessment:   Pt admitted with chest pain, ?mediastinal mass- workup in progress     Action/Plan:   PTA pt lived at home with parent, was independent with ADLs   Anticipated DC Date:  03/25/2012   Anticipated DC Plan:  HOME/SELF CARE      DC Planning Services  CM consult  Medication Assistance  Follow-up appt scheduled      Choice offered to / List presented to:             Status of service:  Completed, signed off Medicare Important Message given?   (If response is "NO", the following Medicare IM given date fields will be blank) Date Medicare IM given:   Date Additional Medicare IM given:    Discharge Disposition:    Per UR Regulation:    If discussed at Long Length of Stay Meetings, dates discussed:   03/24/2012    Comments:  ID clinic follow for 4/18 at 2 pm with Dr. Orvan Falconer  03/25/12 10:36 Letha Cape RN, BSN [phone] patient for dc today, will need med ast with  abx, patient is eligible for zz fund, waiting for scripts from MD.  03/24/12 15:02 Letha Cape RN, BSN [phone] patient is set up with the [facility] for TB Dx, her home RN will be Rebecca Eaton [phone].  They will need a dc summary faxed to [phone] when patient is discharged.  Patient will also be followed by infectious disease.   03/22/12 11:52 Letha Cape RN, BSN [phone] patient's  mother Case Manager Vidal Schwalbe called and wanted to speak with me, she states they brought patient to hospital, she states patient tells her she has TB.  I asked what did she need to talk to me about she states she wanted to know what was wrong with patient , informed her that if patient told her she has TB thats what she has ,  She states she will go in and talk to the patient.  I asked the Case Manager how did patient  get her medications she states she does not know.  Patient will be followed up by infectious disease.  Patient has transportation, her mother will be able to take her home at discharge.  03/16/12- 1145- Donn Pierini RN, BSN [phone] Pt for OR today for  L anterior mediastinotomy and biopsy of mediastinal mass today- to go to 2300 post op- CM to follow- plan to return home with mom.  03/11/12- 1500- Donn Pierini RN BSN [phone] Pt off unit for test- CM to follow for discharge needs- work up in progress- surgical eval pending

## 2012-03-25 NOTE — Discharge Summary (Signed)
PATIENT DETAILS Name: Kelsey Rodriguez Age: 21 y.o. Sex: female Date of Birth: 06-11-1991 MRN: [id number]. Admit Date: 03/10/2012 Admitting Physician: Eduard Clos, MD PCP:No primary provider on file.  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 No pertinent past medical history.  DISCHARGE MEDICATIONS: Medication List  As of 03/25/2012 11:16 AM   STOP taking these medications         albendazole 200 MG tablet      amoxicillin 500 MG tablet      ciprofloxacin 500 MG tablet      diazepam 10 MG tablet      omeprazole 20 MG capsule         TAKE these medications         azithromycin 600 MG tablet   Commonly known as: ZITHROMAX   Take 2 tablets (1,200 mg total) by mouth once a week.      ethambutol 100 MG tablet   Commonly known as: MYAMBUTOL   Take 10 tablets (1,000 mg total) by mouth daily.      feeding supplement Liqd   Take 237 mLs by mouth 2 (two) times daily between meals.      fluconazole 100 MG tablet   Commonly known as: DIFLUCAN   Take 1 tablet (100 mg total) by mouth daily.      isoniazid 300 MG tablet   Commonly known as: NYDRAZID   Take 1 tablet (300 mg total) by mouth daily.      pyrazinamide 500 MG tablet   Take 3 tablets (1,500 mg total) by mouth daily.      pyridOXINE 50 MG tablet   Commonly known as: B-6   Take 1 tablet (50 mg total) by mouth daily.      rifabutin 150 MG capsule   Commonly known as: MYCOBUTIN   Take 2 capsules (300 mg total) by mouth daily.      sulfamethoxazole-trimethoprim 200-40 MG/5ML suspension   Commonly known as: BACTRIM,SEPTRA   Take 20 mLs by mouth daily.      valACYclovir 1000 MG tablet   Commonly known as: VALTREX   Take 1 tablet (1,000 mg total) by mouth  3 (three) times daily.             BRIEF HPI:  See H&P, Labs, Consult and Test reports for all details in brief, patient was admitted for chest pain and no 9 aphasia. A CT scan of the chest on admission showed a large mediastinal mass. For further details please see the history and physical that was done on admission.  CONSULTATIONS:   Cardio-thoracic Surgery, ID and GI  PERTINENT RADIOLOGIC STUDIES: Dg Chest 2 View  03/21/2012  *RADIOLOGY REPORT*  Clinical Data: Follow up infiltrate.  Recurring fevers  CHEST - 2 VIEW  Comparison: 03/19/2012  Findings: Left upper lobe paramediastinal mass and anterior mediastinal adenopathy is unchanged from previous exam.  The heart size is normal.  There is a small left pleural effusion, stable from previous exam.  Right lung is clear.  IMPRESSION:  1.  No significant change in the left upper lobe mass and anterior mediastinal adenopathy. 2.  No acute infiltrates noted.  Original Report Authenticated By: Rosealee Albee, M.D.   Dg Chest 2 View  03/19/2012  *RADIOLOGY REPORT*  Clinical Data: Chest tube removal.  CHEST - 2 VIEW  Comparison: 03/18/2012  Findings: Interval removal of the left chest tube.  Tiny left apical pneumothorax.  Right PICC line is unchanged.  Heart is mildly enlarged.  There is atelectasis in the left lung base.  IMPRESSION: Interval removal of left chest tube with tiny left apical pneumothorax.  Original Report Authenticated By: Cyndie Chime, M.D.   Dg Chest 2 View  03/01/2012  *RADIOLOGY REPORT*  Clinical Data: Shortness of breath, fever  CHEST - 2 VIEW  Comparison: None  Findings: There is abnormal opacity overlying the aortic arch within the medial left upper lung field.  This may represent pneumonia, but follow-up chest x-ray is recommended to ensure clearing.  The remainder of the lungs are clear.  No effusion is seen.  The heart is within normal limits in size.  No bony abnormality is noted.  IMPRESSION: Abnormal opacity overlies  the aortic knob on the frontal view most likely representing pneumonia in this age group.  Recommend follow- up chest x-ray to ensure clearing.  Original Report Authenticated By: Juline Patch, M.D.   Ct Angio Chest W/cm &/or Wo Cm  03/11/2012  *RADIOLOGY REPORT*  Clinical Data: Chest pain and back pain for 3 weeks; elevated D- dimer.  Nausea.  CT ANGIOGRAPHY CHEST  Technique:  Multidetector CT imaging of the chest using the standard protocol during bolus administration of intravenous contrast. Multiplanar reconstructed images including MIPs were obtained and reviewed to evaluate the vascular anatomy.  Contrast: OMNIPAQUE IOHEXOL 350 MG/ML IV SOLN  Comparison: Chest radiograph performed earlier today at 12:14 a.m.  Findings: There is a focal 3.1 x 2.7 x 2.7 cm mass noted abutting the aortic arch, at the periaortic region, with centrally decreased attenuation.  Whether this arises from the mediastinum or from the left upper lobe is difficult to characterize on this study; surrounding atelectasis is noted, and this displaces adjacent vasculature.  Given the patient's age and its location, this may reflect a thymoma, germ cell tumor or less likely lymphoma, given the lack of significant additional lymphadenopathy.  If this arises from the lung, it may reflect an atypical infection such as tuberculosis, though the lack of leukocytosis or additional lung disease suggests against infection.  Adjacent scattered smaller lymph nodes remain grossly normal in size.  The visualized thymus is grossly unremarkable in appearance.  The lungs are otherwise essentially clear bilaterally.  No pulmonary nodules are identified.  No pleural effusion or pneumothorax is seen.  Trace pericardial fluid remains borderline normal in appearance. The great vessels are unremarkable in appearance.  No definite mediastinal lymphadenopathy is otherwise seen; visualized mediastinal nodes remain borderline normal in size.  Diffuse haziness  within the mediastinum raises question for mild diffuse associated soft tissue edema; underlying infiltration of the mediastinum cannot be excluded, given the patient's esophageal symptoms.  The great vessels are grossly unremarkable in appearance.  The visualized portions of the thyroid gland are unremarkable.  No axillary lymphadenopathy is seen.  The visualized portions of the liver and the spleen are unremarkable in appearance.  The gallbladder is within normal limits.  The visualized portions of the pancreas, adrenal glands and both kidneys are normal.  No acute osseous abnormalities are identified.  IMPRESSION:  1.  Focal 3.1 x 2.7 x 2.7 cm mass abutting the aortic arch at the periaortic region, with centrally decreased attenuation.  It is difficult to determine whether this arises from the mediastinum or from the left upper lung lobe; surrounding atelectasis is  noted, with displacement of adjacent vasculature.  Given the patient's age and its location, this may reflect a thymoma, germ cell tumor or less likely lymphoma.  If this arises from the lung, it could reflect an atypical infection such as tuberculosis, though the lack of leukocytosis or additional lung disease suggests against infection. 2.  Scattered mediastinal nodes are otherwise borderline normal in size. 3.  Diffuse haziness within the mediastinum raises question for mild diffuse associated edema; underlying infiltration of the mediastinum cannot be excluded, given the patient's esophageal symptoms.  Original Report Authenticated By: Tonia Ghent, M.D.   Dg Chest Port 1 View  03/18/2012  *RADIOLOGY REPORT*  Clinical Data: VATS.  PORTABLE CHEST - 1 VIEW  Comparison: 03/17/2012  Findings: Left chest tube remains in place, unchanged.  No pneumothorax.  Cardiomegaly.  Right PICC line is in place with the tip at the cavoatrial junction.  Patchy left lung opacities may reflect atelectasis.  IMPRESSION: Left chest tube remains in place without  pneumothorax.  Slight decreasing lung volumes with increasing patchy opacity throughout the left lung, likely atelectasis.  Right PICC line tip cavoatrial junction.  Original Report Authenticated By: Cyndie Chime, M.D.   Dg Chest Port 1 View  03/17/2012  *RADIOLOGY REPORT*  Clinical Data: Postop chest tube placement  PORTABLE CHEST - 1 VIEW  Comparison: Portable chest x-ray of 03/16/2012  Findings: Aeration of the lungs has improved.  Left chest tube remains and no pneumothorax is seen. Heart size is stable.  No bony abnormality is seen.  IMPRESSION: Slightly better aeration.  No pneumothorax.  Left chest tube remains.  Original Report Authenticated By: Juline Patch, M.D.   Dg Chest Portable 1 View  03/16/2012  *RADIOLOGY REPORT*  Clinical Data: Weakness and shortness of breath.  Recent surgery and chest tube placement.  PORTABLE CHEST - 1 VIEW  Comparison: 03/01/2012.  Findings: The left-sided chest tube is in good position.  No definite pneumothorax.  A small amount of subcutaneous emphysema is noted.  Stable density obscuring the left aortic knob.  Streaky areas of atelectasis due to low lung volumes.  No pleural effusion.  IMPRESSION:   Left-sided chest tube in good position without pneumothorax.  2.  Low lung volumes with vascular crowding and areas of atelectasis.  Original Report Authenticated By: P. Loralie Champagne, M.D.   Dg Chest Portable 1 View  03/11/2012  *RADIOLOGY REPORT*  Clinical Data: Chest and abdominal pain for 3 weeks.  PORTABLE CHEST - 1 VIEW  Comparison: Chest radiograph performed 03/01/2012  Findings: The lungs are well-aerated.  Mild persistent opacity overlying the aortic knob again could reflect mild pneumonia.  This is perhaps slightly improved from the prior study.  There is no evidence of pleural effusion or pneumothorax.  The cardiomediastinal silhouette is within normal limits.  No acute osseous abnormalities are seen.  IMPRESSION: Mild persistent opacity overlying the aortic  knob, possibly reflecting pneumonia, as on the prior study.  This has persisted over the past 10 days; suggest clinical correlation for symptoms of pneumonia.  If the patient does not have symptoms for pneumonia, further imaging may be considered, as deemed clinically appropriate.  Original Report Authenticated By: Tonia Ghent, M.D.     PERTINENT LAB RESULTS: CBC: No results found for this basename: WBC:2,HGB:2,HCT:2,PLT:2 in the last 72 hours CMET CMP     Component Value Date/Time   NA 136 03/20/2012 0348   K 4.7 03/20/2012 0348   CL 103 03/20/2012 0348   CO2 24  03/20/2012 0348   GLUCOSE 92 03/20/2012 0348   BUN 4* 03/20/2012 0348   CREATININE 0.58 03/20/2012 0348   CALCIUM 9.2 03/20/2012 0348   PROT 6.3 03/18/2012 0430   ALBUMIN 2.5* 03/18/2012 0430   AST 17 03/18/2012 0430   ALT 8 03/18/2012 0430   ALKPHOS 65 03/18/2012 0430   BILITOT 0.2* 03/18/2012 0430   GFRNONAA >90 03/20/2012 0348   GFRAA >90 03/20/2012 0348    GFR Estimated Creatinine Clearance: 96 ml/min (by C-G formula based on Cr of 0.58). No results found for this basename: LIPASE:2,AMYLASE:2 in the last 72 hours No results found for this basename: CKTOTAL:3,CKMB:3,CKMBINDEX:3,TROPONINI:3 in the last 72 hours No components found with this basename: POCBNP:3 No results found for this basename: DDIMER:2 in the last 72 hours No results found for this basename: HGBA1C:2 in the last 72 hours No results found for this basename: CHOL:2,HDL:2,LDLCALC:2,TRIG:2,CHOLHDL:2,LDLDIRECT:2 in the last 72 hours No results found for this basename: TSH,T4TOTAL,FREET3,T3FREE,THYROIDAB in the last 72 hours No results found for this basename: VITAMINB12:2,FOLATE:2,FERRITIN:2,TIBC:2,IRON:2,RETICCTPCT:2 in the last 72 hours Coags: No results found for this basename: PT:2,INR:2 in the last 72 hours Microbiology: Recent Results (from the past 240 hour(s))  SURGICAL PCR SCREEN     Status: Normal   Collection Time   03/16/12 12:20 PM      Component Value Range Status  Comment   MRSA, PCR NEGATIVE  NEGATIVE  Final    Staphylococcus aureus NEGATIVE  NEGATIVE  Final   AFB CULTURE WITH SMEAR     Status: Normal (Preliminary result)   Collection Time   03/16/12  5:24 PM      Component Value Range Status Comment   Specimen Description TISSUE   Final    Special Requests     Final    Value: PT ON ZINACEF,AZITHROMYCIN,CYTOVENE,SEPTRA MEDIASTINAL MASS   ACID FAST SMEAR     Final    Value: 4+ ACID FAST BACILLI SEEN CRITICAL RESULT CALLED TO, READ BACK BY AND VERIFIED WITH: DR Jerolyn Center 14:15 03/17/12 GF   Culture     Final    Value: CULTURE WILL BE EXAMINED FOR 6 WEEKS BEFORE ISSUING A FINAL REPORT   Report Status PENDING   Incomplete   FUNGUS CULTURE W SMEAR     Status: Normal (Preliminary result)   Collection Time   03/16/12  5:24 PM      Component Value Range Status Comment   Specimen Description TISSUE   Final    Special Requests     Final    Value: PT ON ZINACEF,AZITHROMYCIN,CYTOVENE,SEPTRA MEDIASTINAL MASS   Fungal Smear NO YEAST OR FUNGAL ELEMENTS SEEN   Final    Culture CULTURE IN PROGRESS FOR FOUR WEEKS   Final    Report Status PENDING   Incomplete   CULTURE, BLOOD (ROUTINE X 2)     Status: Normal   Collection Time   03/19/12 12:35 PM      Component Value Range Status Comment   Specimen Description BLOOD LEFT ARM   Final    Special Requests BOTTLES DRAWN AEROBIC AND ANAEROBIC 10CC   Final    Culture  Setup Time [id number]   Final    Culture NO GROWTH 5 DAYS   Final    Report Status 03/25/2012 FINAL   Final   CULTURE, BLOOD (ROUTINE X 2)     Status: Normal   Collection Time   03/19/12 12:50 PM      Component Value Range Status Comment   Specimen Description  BLOOD LEFT HAND   Final    Special Requests BOTTLES DRAWN AEROBIC AND ANAEROBIC 10CC   Final    Culture  Setup Time [id number]   Final    Culture NO GROWTH 5 DAYS   Final    Report Status 03/25/2012 FINAL   Final   AFB CULTURE WITH SMEAR     Status: Normal (Preliminary result)    Collection Time   03/19/12  3:46 PM      Component Value Range Status Comment   Specimen Description SPUTUM   Final    Special Requests Immunocompromised   Final    ACID FAST SMEAR     Final    Value: SUSPICIOUS AFB SMEAR (1-2 ORGANISMS SEEN ON THE ENTIRE SLIDE;PLEASE SUBMIT ADDITIONAL SPECIMENS).   Culture     Final    Value: CULTURE WILL BE EXAMINED FOR 6 WEEKS BEFORE ISSUING A FINAL REPORT   Report Status PENDING   Incomplete   URINE CULTURE     Status: Normal   Collection Time   03/19/12  3:47 PM      Component Value Range Status Comment   Specimen Description URINE, CLEAN CATCH   Final    Special Requests Immunocompromised   Final    Culture  Setup Time [id number]   Final    Colony Count NO GROWTH   Final    Culture NO GROWTH   Final    Report Status 03/21/2012 FINAL   Final      BRIEF HOSPITAL COURSE:   Active Problems:  Chest pain -This is secondary to HSV esophagitis, she will continue with Valtrex and tomorrow and then stop.   Dysphagia/odynophagia -This is secondary to HSV esophagitis. Initially patient had difficulty with swallowing liquids, she was placed on sucralfate PPI and other supportive measures. GI was consulted and EGD was done. Biopsies were taken, unfortunately there was no definite diagnosis. Given the fact that she was HIV positive, patient was empirically started on intravenous ganciclovir. With clinical improvement she was then transitioned to Valtrex. Currently she is able to tolerate a regular diet.   Mediastinal mass -This was evident on the CT scan of the chest that was done on admission to evaluate chest pain and dysphagia. Initial considerations were for lymphoma. -Cardiothoracic surgery was consulted, patient underwent a biopsy-the results of which were consistent with tuberculosis. Subsequently infectious disease start the patient on anti-TB therapy with the 4 drug regimen.  Tuberculosis -As noted above a biopsy of the mediastinal mass was  suggestive of TB. A CT scan of the chest done on admission did not show any parenchymal lesions. However after her biopsy results did come back positive, a sputum AFB sample was obtained which was also positive for AFB. Subsequent attempts to get another sample even with postoperative therapy inducing sputum was unsuccessful. Subsequent discussion with infectious disease physician was done, Dr. Algis Liming recommended no further attempts were necessary as the patient was already on anti-TB therapy. Give account her department has already been notified, they will continue patient on anti-TB therapy via the DOTS program. Our case manager has already notify the health Department of the plan of discharging the patient today, she will get her anti-TB medications here today and from tomorrow she will get it at the health department.  Oral thrush -Continue with Diflucan  Newly Diagnosed HIV/AIDS -Current plans are to start her on antiretroviral therapy 2 weeks after starting her on anti-TB therapy. -Patient's CD4 count was 20. -She has been started on  Zithromax and Bactrim for prophylaxis. -She will followup in the infectious disease clinic as outlined below.   Anemia -This is from chronic disease -Hemoglobin is stable  TODAY-DAY OF DISCHARGE:  Subjective:   Kelsey Rodriguez today has no headache,no chest abdominal pain,no new weakness tingling or numbness, feels much better wants to go home today.   Objective:   Blood pressure 105/71, pulse 85, temperature 98.2 F (36.8 C), temperature source Oral, resp. rate 14, height 5\' 5"  (1.651 m), weight 55.6 kg (122 lb 9.2 oz), last menstrual period 02/21/2012, SpO2 100.00%.  Intake/Output Summary (Last 24 hours) at 03/25/12 1116 Last data filed at 03/25/12 0600  Gross per 24 hour  Intake    880 ml  Output      0 ml  Net    880 ml    Exam Awake Alert, Oriented *3, No new F.N deficits, Normal affect [place].AT,PERRAL Supple Neck,No JVD, No cervical  lymphadenopathy appriciated.  Symmetrical Chest wall movement, Good air movement bilaterally, CTAB RRR,No Gallops,Rubs or new Murmurs, No Parasternal Heave +ve B.Sounds, Abd Soft, Non tender, No organomegaly appriciated, No rebound -guarding or rigidity. No Cyanosis, Clubbing or edema, No new Rash or bruise  DISPOSITION: Home   DISCHARGE INSTRUCTIONS:    Follow-up Information    Follow up with Cliffton Asters, MD on 04/01/2012. (2pm)    Contact information:   [address] [place] [address] [phone]        Patient will [facility] with [facility]   Total Time spent on discharge equals 45 minutes.  SignedJeoffrey Massed 03/25/2012 11:16 AM

## 2012-04-01 ENCOUNTER — Encounter: Payer: Self-pay | Admitting: Internal Medicine

## 2012-04-01 ENCOUNTER — Telehealth: Payer: Self-pay | Admitting: *Deleted

## 2012-04-01 ENCOUNTER — Inpatient Hospital Stay: Payer: Self-pay | Admitting: Internal Medicine

## 2012-04-01 ENCOUNTER — Ambulatory Visit (INDEPENDENT_AMBULATORY_CARE_PROVIDER_SITE_OTHER): Payer: Self-pay | Admitting: Internal Medicine

## 2012-04-01 VITALS — BP 117/78 | HR 89 | Temp 98.2°F | Ht 65.0 in | Wt 125.0 lb

## 2012-04-01 DIAGNOSIS — Z21 Asymptomatic human immunodeficiency virus [HIV] infection status: Secondary | ICD-10-CM

## 2012-04-01 DIAGNOSIS — O98519 Other viral diseases complicating pregnancy, unspecified trimester: Secondary | ICD-10-CM

## 2012-04-01 DIAGNOSIS — B20 Human immunodeficiency virus [HIV] disease: Secondary | ICD-10-CM

## 2012-04-01 LAB — COMPREHENSIVE METABOLIC PANEL
ALT: 20 U/L (ref 0–35)
AST: 26 U/L (ref 0–37)
Albumin: 4.1 g/dL (ref 3.5–5.2)
Alkaline Phosphatase: 79 U/L (ref 39–117)
BUN: 6 mg/dL (ref 6–23)
CO2: 26 mEq/L (ref 19–32)
Calcium: 9 mg/dL (ref 8.4–10.5)
Chloride: 102 mEq/L (ref 96–112)
Creat: 0.58 mg/dL (ref 0.50–1.10)
Glucose, Bld: 80 mg/dL (ref 70–99)
Potassium: 4.7 mEq/L (ref 3.5–5.3)
Sodium: 135 mEq/L (ref 135–145)
Total Bilirubin: 0.3 mg/dL (ref 0.3–1.2)
Total Protein: 8 g/dL (ref 6.0–8.3)

## 2012-04-01 MED ORDER — VALACYCLOVIR HCL 500 MG PO TABS: 500.0000 mg | ORAL_TABLET | Freq: Every day | ORAL | Status: DC

## 2012-04-01 MED ORDER — EMTRICITABINE-TENOFOVIR DF 200-300 MG PO TABS: 1.0000 | ORAL_TABLET | Freq: Every day | ORAL | Status: DC

## 2012-04-01 MED ORDER — RALTEGRAVIR POTASSIUM 400 MG PO TABS: 400.0000 mg | ORAL_TABLET | Freq: Two times a day (BID) | ORAL | Status: DC

## 2012-04-01 NOTE — Progress Notes (Signed)
HIV CLINIC INITIAL VISIT  RFV: establishing care, after recent dx of HIV/extrapulmonary mTB Subjective:    Patient ID: Kelsey Rodriguez, female    DOB: 1991-04-15, 21 y.o.   MRN: [id number]  HPI Kelsey Rodriguez is a 21yo F originally from [place], dx with HIV with CD 4 count of 20(3%)/ VL 228,149, genotype naive. Admitted for malaise, dysphagia, chest pain. 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 She has been on RIPE TB treatment for the last 14 days without difficulty. She presents to clinic in order to start ART. She is currently taking OI proph with bactrim, azithromycin, also finishing a course of valacyclovir for HSV esophagitis.  She states that she is doing well, her appetite is slow to return to baseline. She denies cough. She is denies fever,chills.nightsweats. So far tolerating her RIPE without difficulty  [facility] induced 2 other sputum specimens which were positive for AFB, thus pulmTB. Her dysphagia is completely resolved  Prior to Admission medications   Medication Sig Start Date End Date Taking? Authorizing Provider  azithromycin (ZITHROMAX) 600 MG tablet Take 2 tablets (1,200 mg total) by mouth once a week. 03/25/12 04/24/12 Yes Shanker Levora Dredge, MD  ethambutol (MYAMBUTOL) 100 MG tablet Take 10 tablets (1,000 mg total) by mouth daily. 03/25/12 03/25/13 Yes Shanker Levora Dredge, MD  feeding supplement (ENSURE COMPLETE) LIQD Take 237 mLs by mouth 2 (two) times daily between meals. 03/25/12  Yes Shanker Levora Dredge, MD  isoniazid (NYDRAZID) 300 MG tablet Take 1 tablet (300 mg total) by mouth daily. 03/25/12 04/08/12 Yes Shanker Levora Dredge, MD  pyrazinamide 500 MG tablet Take 3 tablets (1,500 mg total) by mouth daily. 03/25/12 03/25/13 Yes Shanker Levora Dredge, MD  pyridOXINE (B-6) 50 MG tablet Take 1 tablet (50 mg total) by mouth daily. 03/25/12 03/25/13 Yes Shanker Levora Dredge, MD   rifabutin (MYCOBUTIN) 150 MG capsule Take 2 capsules (300 mg total) by mouth daily. 03/25/12 04/24/12 Yes Shanker Levora Dredge, MD  emtricitabine-tenofovir (TRUVADA) 200-300 MG per tablet Take 1 tablet by mouth daily. 04/01/12 04/01/13  [name], MD  ENSURE (ENSURE) Take 1 Can by mouth 2 (two) times daily between meals. 04/02/12 04/02/13  [name], MD  raltegravir (ISENTRESS) 400 MG tablet Take 1 tablet (400 mg total) by mouth 2 (two) times daily. 04/01/12 04/01/13  [name], MD  valACYclovir (VALTREX) 500 MG tablet Take 1 tablet (500 mg total) by mouth daily. 04/01/12 05/01/12  [name], MD   Active Ambulatory Problems    Diagnosis Date Noted  . Chest pain 03/11/2012  . Dysphagia 03/11/2012  . Tuberculosis 03/11/2012  . Anemia 03/11/2012  . Reflux esophagitis 03/11/2012  . 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 HIV (human immunodeficiency virus infection) 03/16/2012   Resolved Ambulatory Problems    Diagnosis Date Noted  . No Resolved Ambulatory Problems   No Additional Past Medical History     Review of Systems  Constitutional: Negative for fever, chills, diaphoresis, activity change, appetite change, fatigue and unexpected weight change.  HENT: Negative for congestion, sore throat, rhinorrhea, sneezing, trouble swallowing and sinus pressure.  Eyes: Negative for photophobia and visual disturbance.  Respiratory: Negative for cough, chest tightness, shortness of breath, wheezing and stridor.  Cardiovascular: Negative for chest pain, palpitations and leg swelling.  Gastrointestinal: Negative for nausea, vomiting, abdominal pain, diarrhea, constipation, blood in stool, abdominal distention and  anal bleeding.  Genitourinary: Negative for dysuria, hematuria, flank pain and difficulty urinating.  Musculoskeletal: Negative for myalgias, back pain, joint swelling, arthralgias and gait problem.  Skin: Negative for color change, pallor, rash and wound.  Neurological: Negative  for dizziness, tremors, weakness and light-headedness.  Hematological: Negative for adenopathy. Does not bruise/bleed easily.  Psychiatric/Behavioral: Negative for behavioral problems, confusion, sleep disturbance, dysphoric mood, decreased concentration and agitation.       Objective:   Physical Exam BP 117/78  Pulse 89  Temp(Src) 98.2 F (36.8 C) (Oral)  Ht 5\' 5"  (1.651 m)  Wt 125 lb (56.7 kg)  BMI 20.80 kg/m2  LMP 02/21/2012  General Appearance:    Alert, cooperative, no distress, appears stated age  Head:    Normocephalic, without obvious abnormality, atraumatic  Eyes:    PERRL, conjunctiva/corneas clear, EOM's intact,  Ears:    Normal TM's and external ear canals, both ears  Nose:   Nares normal, septum midline, mucosa normal, no drainage    or sinus tenderness  Throat:   Lips, mucosa, and tongue normal; teeth and gums normal  Neck:   Supple, symmetrical, trachea midline, no adenopathy;      Back:     Symmetric, no curvature, ROM normal, no CVA tenderness  Lungs:     Clear to auscultation bilaterally, respirations unlabored  Chest Wall:    Incision site for thoracotomy and chest tube is healing, suture in place   Heart:    Regular rate and rhythm, S1 and S2 normal, no murmur, rub   or gallop     Abdomen:     Soft, non-tender, bowel sounds active all four quadrants,    no masses, no organomegaly        Extremities:   Extremities normal, atraumatic, no cyanosis or edema  Pulses:   2+ and symmetric all extremities  Skin:   Skin color, texture, turgor normal, no rashes or lesions  Lymph nodes:   Cervical, supraclavicular, and axillary nodes normal  Neurologic:   CNII-XII intact, normal strength, sensation and reflexes    throughout         Assessment & Plan:   HIV/AIDS = will start RAL/truvada, since patient has now been on TB treatment for 2 wks. We will check CBC with diff and CMP.  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 We will communicate with health dept to watch for signs of IRIS in the coming 2-4 wks.  rtc in 2-4 month unless fever occurs.

## 2012-04-01 NOTE — Telephone Encounter (Signed)
Kelsey Kiel RN  with the local health department wanted to make sure we knew that she had pulmonary TB. I told her she was seen today & md is aware

## 2012-04-02 ENCOUNTER — Other Ambulatory Visit: Payer: Self-pay | Admitting: *Deleted

## 2012-04-02 DIAGNOSIS — E46 Unspecified protein-calorie malnutrition: Secondary | ICD-10-CM

## 2012-04-02 LAB — CBC WITH DIFFERENTIAL/PLATELET
Basophils Absolute: 0 10*3/uL (ref 0.0–0.1)
Basophils Relative: 0 % (ref 0–1)
Eosinophils Absolute: 0 10*3/uL (ref 0.0–0.7)
Eosinophils Relative: 2 % (ref 0–5)
HCT: 31.5 % — ABNORMAL LOW (ref 36.0–46.0)
Hemoglobin: 10.3 g/dL — ABNORMAL LOW (ref 12.0–15.0)
Lymphocytes Relative: 42 % (ref 12–46)
Lymphs Abs: 0.7 10*3/uL (ref 0.7–4.0)
MCH: 29.8 pg (ref 26.0–34.0)
MCHC: 32.7 g/dL (ref 30.0–36.0)
MCV: 91 fL (ref 78.0–100.0)
Monocytes Absolute: 0.4 10*3/uL (ref 0.1–1.0)
Monocytes Relative: 24 % — ABNORMAL HIGH (ref 3–12)
Neutro Abs: 0.5 10*3/uL — ABNORMAL LOW (ref 1.7–7.7)
Neutrophils Relative %: 32 % — ABNORMAL LOW (ref 43–77)
Platelets: 262 10*3/uL (ref 150–400)
RBC: 3.46 MIL/uL — ABNORMAL LOW (ref 3.87–5.11)
RDW: 16.9 % — ABNORMAL HIGH (ref 11.5–15.5)
WBC: 1.7 10*3/uL — ABNORMAL LOW (ref 4.0–10.5)

## 2012-04-02 LAB — PATHOLOGIST SMEAR REVIEW

## 2012-04-02 MED ORDER — ENSURE PO LIQD: 1.0000 | Freq: Two times a day (BID) | ORAL | Status: DC

## 2012-04-07 ENCOUNTER — Telehealth: Payer: Self-pay | Admitting: *Deleted

## 2012-04-07 NOTE — Telephone Encounter (Signed)
rec'd a call from Claris Che the TB nurse at [facility]. Pt had CP yesterday & a fever of 100.3 today. She is getting an xray of chest tomorrow. I called Dr. Drue Second & advised her of this.

## 2012-04-08 ENCOUNTER — Ambulatory Visit (INDEPENDENT_AMBULATORY_CARE_PROVIDER_SITE_OTHER): Payer: Self-pay | Admitting: Internal Medicine

## 2012-04-08 ENCOUNTER — Encounter (HOSPITAL_COMMUNITY): Payer: Self-pay | Admitting: General Practice

## 2012-04-08 ENCOUNTER — Other Ambulatory Visit: Payer: Self-pay | Admitting: Infectious Diseases

## 2012-04-08 ENCOUNTER — Ambulatory Visit
Admission: RE | Admit: 2012-04-08 | Discharge: 2012-04-08 | Disposition: A | Discharge status: Home / Self Care | Payer: No Typology Code available for payment source | Source: Ambulatory Visit | Attending: Infectious Diseases | Admitting: Infectious Diseases

## 2012-04-08 ENCOUNTER — Encounter: Payer: Self-pay | Admitting: Internal Medicine

## 2012-04-08 ENCOUNTER — Inpatient Hospital Stay (HOSPITAL_COMMUNITY)
Admission: AD | Admit: 2012-04-08 | Discharge: 2012-04-12 | DRG: 975 | Disposition: A | Discharge status: Home / Self Care | Payer: Medicaid Other | Source: Ambulatory Visit | Attending: Internal Medicine | Admitting: Internal Medicine

## 2012-04-08 VITALS — BP 104/67 | HR 109 | Temp 100.2°F | Wt 124.0 lb

## 2012-04-08 DIAGNOSIS — A150 Tuberculosis of lung: Secondary | ICD-10-CM | POA: Diagnosis present

## 2012-04-08 DIAGNOSIS — B0089 Other herpesviral infection: Secondary | ICD-10-CM | POA: Diagnosis present

## 2012-04-08 DIAGNOSIS — R0789 Other chest pain: Secondary | ICD-10-CM | POA: Diagnosis present

## 2012-04-08 DIAGNOSIS — A154 Tuberculosis of intrathoracic lymph nodes: Secondary | ICD-10-CM | POA: Diagnosis present

## 2012-04-08 DIAGNOSIS — E46 Unspecified protein-calorie malnutrition: Secondary | ICD-10-CM

## 2012-04-08 DIAGNOSIS — B999 Unspecified infectious disease: Secondary | ICD-10-CM

## 2012-04-08 DIAGNOSIS — B20 Human immunodeficiency virus [HIV] disease: Principal | ICD-10-CM | POA: Diagnosis present

## 2012-04-08 DIAGNOSIS — K208 Other esophagitis without bleeding: Secondary | ICD-10-CM | POA: Diagnosis present

## 2012-04-08 DIAGNOSIS — Z23 Encounter for immunization: Secondary | ICD-10-CM

## 2012-04-08 DIAGNOSIS — Z21 Asymptomatic human immunodeficiency virus [HIV] infection status: Secondary | ICD-10-CM

## 2012-04-08 DIAGNOSIS — R509 Fever, unspecified: Secondary | ICD-10-CM

## 2012-04-08 DIAGNOSIS — R079 Chest pain, unspecified: Secondary | ICD-10-CM | POA: Diagnosis present

## 2012-04-08 DIAGNOSIS — R000 Tachycardia, unspecified: Secondary | ICD-10-CM | POA: Diagnosis present

## 2012-04-08 DIAGNOSIS — D509 Iron deficiency anemia, unspecified: Secondary | ICD-10-CM | POA: Diagnosis present

## 2012-04-08 DIAGNOSIS — R1310 Dysphagia, unspecified: Secondary | ICD-10-CM | POA: Diagnosis present

## 2012-04-08 DIAGNOSIS — R112 Nausea with vomiting, unspecified: Secondary | ICD-10-CM | POA: Diagnosis present

## 2012-04-08 DIAGNOSIS — D649 Anemia, unspecified: Secondary | ICD-10-CM | POA: Diagnosis present

## 2012-04-08 DIAGNOSIS — N179 Acute kidney failure, unspecified: Secondary | ICD-10-CM | POA: Diagnosis present

## 2012-04-08 HISTORY — DX: Human immunodeficiency virus (HIV) disease: B20

## 2012-04-08 HISTORY — DX: Respiratory tuberculosis unspecified: A15.9

## 2012-04-08 LAB — COMPREHENSIVE METABOLIC PANEL
ALT: 18 U/L (ref 0–35)
AST: 20 U/L (ref 0–37)
Albumin: 3.3 g/dL — ABNORMAL LOW (ref 3.5–5.2)
Alkaline Phosphatase: 92 U/L (ref 39–117)
BUN: 6 mg/dL (ref 6–23)
CO2: 25 mEq/L (ref 19–32)
Calcium: 9.5 mg/dL (ref 8.4–10.5)
Chloride: 100 mEq/L (ref 96–112)
Creatinine, Ser: 0.69 mg/dL (ref 0.50–1.10)
GFR calc Af Amer: >90 mL/min (ref >90)
GFR calc non Af Amer: >90 mL/min (ref >90)
Glucose, Bld: 82 mg/dL (ref 70–99)
Potassium: 4.1 mEq/L (ref 3.5–5.1)
Sodium: 136 mEq/L (ref 135–145)
Total Bilirubin: 0.3 mg/dL (ref 0.3–1.2)
Total Protein: 8.1 g/dL (ref 6.0–8.3)

## 2012-04-08 LAB — CBC
HCT: 30.6 % — ABNORMAL LOW (ref 36.0–46.0)
Hemoglobin: 10.2 g/dL — ABNORMAL LOW (ref 12.0–15.0)
MCH: 29.3 pg (ref 26.0–34.0)
MCHC: 33.3 g/dL (ref 30.0–36.0)
MCV: 87.9 fL (ref 78.0–100.0)
Platelets: 163 10*3/uL (ref 150–400)
RBC: 3.48 MIL/uL — ABNORMAL LOW (ref 3.87–5.11)
RDW: 15.8 % — ABNORMAL HIGH (ref 11.5–15.5)
WBC: 4.3 10*3/uL (ref 4.0–10.5)

## 2012-04-08 LAB — DIFFERENTIAL
Basophils Absolute: 0 10*3/uL (ref 0.0–0.1)
Basophils Relative: 0 % (ref 0–1)
Eosinophils Absolute: 0 10*3/uL (ref 0.0–0.7)
Eosinophils Relative: 1 % (ref 0–5)
Lymphocytes Relative: 23 % (ref 12–46)
Lymphs Abs: 1 10*3/uL (ref 0.7–4.0)
Monocytes Absolute: 0.7 10*3/uL (ref 0.1–1.0)
Monocytes Relative: 15 % — ABNORMAL HIGH (ref 3–12)
Neutro Abs: 2.6 10*3/uL (ref 1.7–7.7)
Neutrophils Relative %: 61 % (ref 43–77)

## 2012-04-08 LAB — CARDIAC PANEL(CRET KIN+CKTOT+MB+TROPI)
CK, MB: 0.9 ng/mL (ref 0.3–4.0)
Relative Index: INVALID (ref 0.0–2.5)
Total CK: 55 U/L (ref 7–177)
Troponin I: <0.3 ng/mL (ref <0.30)

## 2012-04-08 MED ORDER — EMTRICITABINE-TENOFOVIR DF 200-300 MG PO TABS
1.0000 | ORAL_TABLET | Freq: Every day | ORAL | Status: DC
  Administered 2012-04-09 – 2012-04-12 (×4): 1 via ORAL
  Filled 2012-04-08 (×4): qty 1

## 2012-04-08 MED ORDER — RIFABUTIN 150 MG PO CAPS
300.0000 mg | ORAL_CAPSULE | Freq: Every day | ORAL | Status: DC
  Administered 2012-04-08 – 2012-04-12 (×5): 300 mg via ORAL
  Filled 2012-04-08 (×5): qty 2

## 2012-04-08 MED ORDER — DAPSONE 100 MG PO TABS
100.0000 mg | ORAL_TABLET | Freq: Every day | ORAL | Status: DC
  Administered 2012-04-08 – 2012-04-12 (×5): 100 mg via ORAL
  Filled 2012-04-08 (×5): qty 1

## 2012-04-08 MED ORDER — ONDANSETRON HCL 4 MG/2ML IJ SOLN
4.0000 mg | Freq: Four times a day (QID) | INTRAMUSCULAR | Status: DC | PRN
  Administered 2012-04-09 (×2): 4 mg via INTRAVENOUS
  Filled 2012-04-08 (×2): qty 2

## 2012-04-08 MED ORDER — ACETAMINOPHEN 650 MG RE SUPP: 650.0000 mg | Freq: Four times a day (QID) | RECTAL | Status: DC | PRN

## 2012-04-08 MED ORDER — PYRAZINAMIDE 500 MG PO TABS
1000.0000 mg | ORAL_TABLET | Freq: Every day | ORAL | Status: DC
  Administered 2012-04-08 – 2012-04-12 (×5): 1000 mg via ORAL
  Filled 2012-04-08 (×5): qty 2

## 2012-04-08 MED ORDER — ACETAMINOPHEN 325 MG PO TABS
650.0000 mg | ORAL_TABLET | Freq: Four times a day (QID) | ORAL | Status: DC | PRN
  Filled 2012-04-08: qty 2

## 2012-04-08 MED ORDER — ETHAMBUTOL HCL 400 MG PO TABS
800.0000 mg | ORAL_TABLET | Freq: Every day | ORAL | Status: DC
  Administered 2012-04-08 – 2012-04-12 (×5): 800 mg via ORAL
  Filled 2012-04-08 (×5): qty 2

## 2012-04-08 MED ORDER — DEXTROSE 5 % IV SOLN
5.0000 mg/kg | Freq: Three times a day (TID) | INTRAVENOUS | Status: DC
  Administered 2012-04-08 – 2012-04-10 (×5): 285 mg via INTRAVENOUS
  Filled 2012-04-08 (×7): qty 5.7

## 2012-04-08 MED ORDER — RALTEGRAVIR POTASSIUM 400 MG PO TABS
400.0000 mg | ORAL_TABLET | Freq: Two times a day (BID) | ORAL | Status: DC
  Administered 2012-04-08 – 2012-04-12 (×8): 400 mg via ORAL
  Filled 2012-04-08 (×9): qty 1

## 2012-04-08 MED ORDER — SULFAMETHOXAZOLE-TRIMETHOPRIM 800-160 MG PO TABS: 1.0000 | ORAL_TABLET | Freq: Every day | ORAL | Status: DC

## 2012-04-08 MED ORDER — MORPHINE SULFATE 2 MG/ML IJ SOLN
2.0000 mg | INTRAMUSCULAR | Status: DC | PRN
  Administered 2012-04-11 – 2012-04-12 (×2): 2 mg via INTRAVENOUS
  Filled 2012-04-08 (×2): qty 1

## 2012-04-08 MED ORDER — ENSURE COMPLETE PO LIQD
237.0000 mL | Freq: Two times a day (BID) | ORAL | Status: DC
  Administered 2012-04-09 – 2012-04-12 (×5): 237 mL via ORAL

## 2012-04-08 MED ORDER — KETOROLAC TROMETHAMINE 30 MG/ML IM SOLN
60.0000 mg | INTRAMUSCULAR | Status: AC
  Administered 2012-04-08: 60 mg via INTRAMUSCULAR

## 2012-04-08 MED ORDER — PYRAZINAMIDE 500 MG PO TABS: 1000.0000 mg | ORAL_TABLET | Freq: Every day | ORAL | Status: DC

## 2012-04-08 MED ORDER — ALUM & MAG HYDROXIDE-SIMETH 200-200-20 MG/5ML PO SUSP
30.0000 mL | Freq: Four times a day (QID) | ORAL | Status: DC | PRN
  Administered 2012-04-11: 30 mL via ORAL
  Filled 2012-04-08: qty 30

## 2012-04-08 MED ORDER — ETHAMBUTOL HCL 100 MG PO TABS: 800.0000 mg | ORAL_TABLET | Freq: Every day | ORAL | Status: DC

## 2012-04-08 MED ORDER — ONDANSETRON HCL 4 MG PO TABS: 4.0000 mg | ORAL_TABLET | Freq: Four times a day (QID) | ORAL | Status: DC | PRN

## 2012-04-08 MED ORDER — ISONIAZID 300 MG PO TABS
300.0000 mg | ORAL_TABLET | Freq: Every day | ORAL | Status: DC
  Administered 2012-04-08 – 2012-04-12 (×5): 300 mg via ORAL
  Filled 2012-04-08 (×5): qty 1

## 2012-04-08 MED ORDER — ENOXAPARIN SODIUM 40 MG/0.4ML ~~LOC~~ SOLN
40.0000 mg | SUBCUTANEOUS | Status: DC
  Administered 2012-04-08 – 2012-04-11 (×4): 40 mg via SUBCUTANEOUS
  Filled 2012-04-08 (×5): qty 0.4

## 2012-04-08 MED ORDER — AZITHROMYCIN 600 MG PO TABS
1200.0000 mg | ORAL_TABLET | ORAL | Status: DC
  Administered 2012-04-09: 1200 mg via ORAL
  Filled 2012-04-08: qty 2

## 2012-04-08 MED ORDER — PNEUMOCOCCAL VAC POLYVALENT 25 MCG/0.5ML IJ INJ
0.5000 mL | INJECTION | INTRAMUSCULAR | Status: AC
  Administered 2012-04-09: 0.5 mL via INTRAMUSCULAR
  Filled 2012-04-08: qty 0.5

## 2012-04-08 MED ORDER — PYRIDOXINE HCL 25 MG PO TABS: 25.0000 mg | ORAL_TABLET | Freq: Every day | ORAL | Status: DC

## 2012-04-08 MED ORDER — PYRIDOXINE HCL 50 MG PO TABS: 25.0000 mg | ORAL_TABLET | Freq: Every day | ORAL | Status: DC

## 2012-04-08 MED ORDER — SODIUM CHLORIDE 0.9 % IV SOLN
INTRAVENOUS | Status: DC
  Administered 2012-04-08 – 2012-04-11 (×7): via INTRAVENOUS

## 2012-04-08 MED ORDER — PYRIDOXINE HCL 25 MG PO TABS
25.0000 mg | ORAL_TABLET | Freq: Every day | ORAL | Status: DC
  Administered 2012-04-08 – 2012-04-12 (×5): 25 mg via ORAL
  Filled 2012-04-08 (×5): qty 1

## 2012-04-08 NOTE — Progress Notes (Addendum)
HIV CLINIC SICK VISIT  RFV: fever of 100.3, and chest discomfort while being treated for TB/HIV  Subjective:    Patient ID: Kelsey Rodriguez, female    DOB: 1991/06/15, 21 y.o.   MRN: [id number]  HPI 21yo F with newly diagnosed HIV with co-infection ofpulmTb with extrapulm mass currently on 4th week of pulm tx TB(rifabutin, IPE) and 5th day of HIV tx with RAL/Truvada. CD 4 count of 20(8%)/VL 228,149 in March 2013. She was seen last week in clinic in order to start her HIV regimen. At that time she was not having any fevers, tolerating her medications without difficulty. Since starting her HIV medicines, the patient now reports having chest pain x 2 days, similar to what brought her to the hospital in March. She states that it was originally pleuretic but now a constant ache.The patient has not taken any other medications such as tylenol or ibuprofen for this pain. She is unable to lay flat due to exacerbating her chest discomfort. She has not laid flat in 2 days due to this discomfort. She states she has lost her appetite in the last 2 days and has had poor po intake.  A Fever of 100.76F, was noted yesterday by HD nurse. Thus, we had the patient come to clinic to be evaluated. She underwent a cxr today which did not show any appreciable change in the size of her mediastinal mass  While in clinic, she is afebrile, tachycardic with HR in 120s, normotensive. We were unable to get PIV started for IVF hydration  HIV related labs: Genotype 02/2012: minor PI mutations, L10V,K20R, M36I HepA immune/ HBV negative/HCV negative Toxo positive CMV positive SCr Ag negative  Prior to Admission medications   Medication Sig Start Date End Date Taking? Authorizing Provider  azithromycin (ZITHROMAX) 600 MG tablet Take 2 tablets (1,200 mg total) by mouth once a week. 03/25/12 04/24/12  Shanker Levora Dredge, MD  emtricitabine-tenofovir (TRUVADA) 200-300 MG per tablet Take 1 tablet by mouth daily. 04/01/12 04/01/13  [name],  MD  ENSURE (ENSURE) Take 1 Can by mouth 2 (two) times daily between meals. 04/02/12 04/02/13  [name], MD  ethambutol (MYAMBUTOL) 100 MG tablet Take 8 tablets (800 mg total) by mouth daily. 04/08/12 04/08/13  [name], MD  feeding supplement (ENSURE COMPLETE) LIQD Take 237 mLs by mouth 2 (two) times daily between meals. 03/25/12   Shanker Levora Dredge, MD  isoniazid (NYDRAZID) 300 MG tablet Take 1 tablet (300 mg total) by mouth daily. 03/25/12 04/08/12  Shanker Levora Dredge, MD  pyrazinamide 500 MG tablet Take 2 tablets (1,000 mg total) by mouth daily. 04/08/12 04/08/13  [name], MD  pyridOXINE (B-6) 50 MG tablet Take 0.5 tablets (25 mg total) by mouth daily. 04/08/12 04/08/13  [name], MD  raltegravir (ISENTRESS) 400 MG tablet Take 1 tablet (400 mg total) by mouth 2 (two) times daily. 04/01/12 04/01/13  [name], MD  rifabutin (MYCOBUTIN) 150 MG capsule Take 2 capsules (300 mg total) by mouth daily. 03/25/12 04/24/12  Shanker Levora Dredge, MD  sulfamethoxazole-trimethoprim (BACTRIM DS) 800-160 MG per tablet Take 1 tablet by mouth daily. 04/08/12 04/11/12  [name], MD  valACYclovir (VALTREX) 500 MG tablet Take 1 tablet (500 mg total) by mouth daily. 04/01/12 05/01/12  [name], MD   All: No Known Allergies   Active Ambulatory Problems    Diagnosis Date Noted  . Chest pain 03/11/2012  . Dysphagia 03/11/2012  . Tuberculosis 03/11/2012  . Anemia 03/11/2012  . Reflux esophagitis  03/11/2012  . 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 HIV (human immunodeficiency virus infection) 03/16/2012   Resolved Ambulatory Problems    Diagnosis Date Noted  . No Resolved Ambulatory Problems   No Additional Past Medical History     Review of Systems  Constitutional: positive for fever, chills,  But nodiaphoresis, activity change; decrease appetite,  Plus fatigue. no unexpected weight change.  HENT: Negative for congestion, sore throat, rhinorrhea, sneezing, trouble swallowing and  sinus pressure.  Eyes: Negative for photophobia and visual disturbance.  Respiratory: Negative for cough, chest tightness, shortness of breath, wheezing and stridor.  Cardiovascular: positive for chest pain, palpitations and no leg swelling.  Gastrointestinal: Negative for nausea, vomiting, abdominal pain, diarrhea, constipation, blood in stool, abdominal distention and anal bleeding.  Genitourinary: Negative for dysuria, hematuria, flank pain and difficulty urinating.  Musculoskeletal: Negative for myalgias, back pain, joint swelling, arthralgias and gait problem.  Skin: Negative for color change, pallor, rash and wound.  Neurological: Negative for dizziness, tremors, weakness and light-headedness.  Hematological: Negative for adenopathy. Does not bruise/bleed easily.  Psychiatric/Behavioral: Negative for behavioral problems, confusion, sleep disturbance, dysphoric mood, decreased concentration and agitation.       Objective:   Physical Exam BP 104/67  Pulse 109  Temp(Src) 100.2 F (37.9 C) (Oral)  Wt 124 lb (56.246 kg)  LMP 03/29/2012  General Appearance:    Alert, cooperative, fatigue, mildly ill appearing, appears stated age  Head:    Normocephalic, without obvious abnormality, atraumatic  Eyes:    PERRL, conjunctiva/corneas clear, EOM's intact, pale conjunctiva  Ears:    Normal TM's and external ear canals, both ears  Nose:   Nares normal, septum midline, mucosa normal, no drainage    or sinus tenderness  Throat:   Lips, mucosa, and tongue normal; teeth and gums normal. Dry oral mucosa. No signs of thrush  Neck:   Supple, symmetrical, trachea midline, no adenopathy;     Back:     Symmetric, no curvature, ROM normal, no CVA tenderness  Lungs:     Clear to auscultation bilaterally, respirations unlabored  Chest Wall:    Surgical incision on Left upper chest wall from thoracotomy is well healed.   Heart:    tachycardic, S1 and S2 normal, no murmur, rub   or gallop     Abdomen:      Soft, non-tender, bowel sounds active all four quadrants,    no masses, no organomegaly        Extremities:   Extremities normal, atraumatic, no cyanosis or edema  Pulses:   2+ and symmetric all extremities  Skin:   Skin color, texture, turgor normal, no rashes or lesions; warm to touch  Lymph nodes:   Cervical, supraclavicular, and axillary nodes normal        Assessment & Plan:   pulmonary TB c/b mediastinal TB mass+ = will continue on the following regimen. She has 3 wks of  She has not received her doses today, thus will need the following medication today at the hospital.  - rifabuntin 300mg  daily - pyrazinamide 1000mg  daily - isoniazid 300mg  daily - ethambutol 800mg  daily - vitamin B6 25mg  daily  - while hospitalized she will need to still be on airborne- negative pressure isolation for active pulm TB  Chest discomfort = non-cardiac in origin,she describes having 2 days of chest aching similar to her presentation in march. She reports predominantly substernal, nonradiating; initially pleuretic but now constant aching, worse when lying down. Possibly due to mediastinal mass  vs. Esophagitis.  Giving 60mg  toradol at this clinic visit for pain control  Tachycardia = possibly due to chest discomfort and dehydration. We were unable to get PIV in clinic to give IVF.  Fevers= patient had low grade fever of 100.3 yesterday but in setting on neutropenia, and dehydration, we will need to have patient admitted and evaluated. We will check stat cbc to see if still has neutropenia. If she does have fevers, will recommend to have blood cultures, and start emperic abtx with cefepime for febrile neutropenia. She maybe mounting fever since her current Temp is 100.83F  Her fevers could be due to her underlying TB vs. IRIS -TB vs. other infectious causes.   TB- IRIS could be in the differential of this presentation. it would be considerably early in the process, but not unheard of. Will try to do  NSAIDS for now, and not default to steroids as of yet.   Neutropenia =likely drug induced. We will recommend to stop valcyclovir and also stop bactrim. We will change OI proph to dapsone.  HSV esophagitis= patient reports dysphagia improved however chest pain can also be caused by esophagitis. We will have primary team evaluate to determine etiology. For now, she has received full course of therapy for esophagitis. Will stop valcyclovir due to possible causing leukopenia  HIV= patient has just started her HIV regimen of raltegravir and truvada (D#5); will continue this current regimen.  Dispo= will admit to general medicine teaching service, in order to work-up/ treat chest discomfort, work up fever +/- neutropenia, tx pulm TB, and tx dehydration. She will need to be kept on airborne isolation during the duration of her hospitalization.    Addendum: Lab Results  Component Value Date   WBC 4.3 04/08/2012   HGB 10.2* 04/08/2012   HCT 30.6* 04/08/2012   MCV 87.9 04/08/2012   PLT 163 04/08/2012   ANC 2.6  CMP     Component Value Date/Time   NA 136 04/08/2012 1145   K 4.1 04/08/2012 1145   CL 100 04/08/2012 1145   CO2 25 04/08/2012 1145   GLUCOSE 82 04/08/2012 1145   BUN 6 04/08/2012 1145   CREATININE 0.69 04/08/2012 1145   CREATININE 0.58 04/01/2012 1506   CALCIUM 9.5 04/08/2012 1145   PROT 8.1 04/08/2012 1145   ALBUMIN 3.3* 04/08/2012 1145   AST 20 04/08/2012 1145   ALT 18 04/08/2012 1145   ALKPHOS 92 04/08/2012 1145   BILITOT 0.3 04/08/2012 1145   GFRNONAA >90 04/08/2012 1145   GFRAA >90 04/08/2012 1145     - patient is not neutropenic per stat CBC. But will still admit to manage pain and dehydration. +/- work-up for fevers  Donaven Criswell B. Drue Second MD MPH [facility] [phone]

## 2012-04-08 NOTE — Progress Notes (Signed)
Spoke with Gwenlyn Perking regarding orders he stated he will be putting orders in soon.

## 2012-04-08 NOTE — Progress Notes (Signed)
1630 Patient arrived  to floor from home.Alert/oriented no distress noted. Dr. Manson Passey in to see patient. Patient is nontelemetry  . No orders at this time.[name]

## 2012-04-08 NOTE — H&P (Signed)
Internal Medicine Teaching Service Resident Admission Note Date: 04/08/2012  Patient name: Kelsey Rodriguez Medical record number: [id number] Date of birth: [birth year] Age: 21 y.o. Gender: female PCP: No PCP  Medical Service: Internal Medicine Teaching Service  I have reviewed the note by Aggie Hacker MS3 and was present during the interview and physical exam.  Please see below for findings, assessment, and plan.  Chief Complaint: epigastric pain, chest burning sensation  History of Present Illness: The patient is a 21 yo woman, history of HIV and TB, presenting with epigastric pain.  The patient was recently admitted 03/10/12-03/25/12 for similar symptoms, diagnosed with pulmonary TB (by lung biopsy of TB nodule, and 3 AFB smears obtained after hospital discharge), and started on 4-drug treatment for TB.  She was also diagnosed with HIV at that time, CD4 = 20, VL = 228K, genotype naieve, though not started on HAART until her follow-up ID clinic visit 04/01/12 (she started taking the medications on 4/20).  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  The patient notes that after her last hospitalization, her feelings of epigastric discomfort, dysphagia, and heartburn subsided.  Five days ago she started HAART (truvada, raltegravir).  Two days ago, she again experienced symptoms of dysphagia to solid foods (though not liquids), as well as a constant burning epigastric and retropharyngeal pain which is worse in the morning and when laying down at night, after eating food or belching, and after taking a deep breath.  She notes poor PO intake over the last few days secondary to the pain, and low grade temperatures up to 100.3.  She notes no vomiting, diarrhea, or constipation. She was seen in ID clinic today for a follow-up visit with these symptoms, and though repeat CXR showed no appreciable change, she  was found to be tachycardic with HR in 120's, and as an IV could not be started for IV hydration (in setting of tachycardia and poor PO intake), and as patient was recently neutropenic, the patient was admitted for IV hydration, pain control, further evaluation of symptoms, and possible fever work-up.  Meds: Medications Prior to Admission  Medication Sig Dispense Refill  . azithromycin (ZITHROMAX) 600 MG tablet Take 2 tablets (1,200 mg total) by mouth once a week.  10 tablet  0  . emtricitabine-tenofovir (TRUVADA) 200-300 MG per tablet Take 1 tablet by mouth daily.  30 tablet  5  . ENSURE (ENSURE) Take 1 Can by mouth 2 (two) times daily between meals.  237 mL  12  . ethambutol (MYAMBUTOL) 100 MG tablet Take 8 tablets (800 mg total) by mouth daily.      . feeding supplement (ENSURE COMPLETE) LIQD Take 237 mLs by mouth 2 (two) times daily between meals.      [name] isoniazid (NYDRAZID) 300 MG tablet Take 1 tablet (300 mg total) by mouth daily.      . pyrazinamide 500 MG tablet Take 2 tablets (1,000 mg total) by mouth daily.  60 tablet  0  . pyridOXINE (B-6) 50 MG tablet Take 0.5 tablets (25 mg total) by mouth daily.  30 tablet  0  . raltegravir (ISENTRESS) 400 MG tablet Take 1 tablet (400 mg total) by mouth 2 (two) times daily.  60 tablet  5  . rifabutin (MYCOBUTIN) 150 MG capsule Take 2 capsules (300 mg total) by mouth daily.      [name] sulfamethoxazole-trimethoprim (BACTRIM DS) 800-160 MG per tablet Take 1  tablet by mouth daily.  6 tablet  0  . valACYclovir (VALTREX) 500 MG tablet Take 1 tablet (500 mg total) by mouth daily.  30 tablet  5    Allergies: Allergies as of 04/08/2012  . (No Known Allergies)    Past Medical History: Medical Student note reviewed  Family History: Medical Student note reviewed  Social History: Medical Student note reviewed  Surgical History: Medical Student note reviewed  Review of System: Medical Student note reviewed  Physical Exam: Blood pressure 106/74,  pulse 94, temperature 98.9 F (37.2 C), temperature source Oral, resp. rate 18, height 5\' 5"  (1.651 m), weight 124 lb 12.5 oz (56.6 kg), last menstrual period 03/29/2012, SpO2 100.00%. General: alert, cooperative, and in no apparent distress HEENT: pupils equal round and reactive to light, vision grossly intact, oropharynx clear and non-erythematous  Neck: supple, no lymphadenopathy Lungs: clear to ascultation bilaterally, normal work of respiration, no wheezes, rales, ronchi. Chest significantly tender to sternal palpation Heart: tachycardic, regular rhythm, no murmurs, gallops, or rubs Abdomen: soft, moderately tender to epigastric palpation, non-distended, normal bowel sounds Extremities: no cyanosis, clubbing, or edema Neurologic: alert & oriented X3, cranial nerves II-XII intact, strength grossly intact, sensation intact to light touch  Labs: Reviewed as noted in the Electronic Record  Imaging: Reviewed as noted in the Electronic Record  Assessment & Plan by Problem: The patient is a 21 yo woman, history of HIV (CD4 = 20), TB, and HSV esophagitis, presenting with epigastric pain and dysphagia, likely representing continued HSV esophagitis.  # Dysphagia/abd pain - diagnosed by HSV culture of esophageal tissue biopsy, treated with 21-day course of valacyclovir (4/3-4/25).  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  Other potential etiologies include GERD vs candidal esophagitis (history of thrush on last admission, treated with diflucan, though no candidal esophagitis seen on EGD) vs medication side effect (abd pain seen in emtricitabine 8-14%, tenofovir 4-7%).  Less likely CMV esophagitis, given CMV PCR negative on prior biopsy.  -given continued symptomatic HSV esophagitis as leading diagnosis, will restart acyclovir -ID note  indicates concern over valcyclovir-induced leukopenia, though patient's leukocytes are currently wnl (though whether this represents normal WBC count vs leukocytosis imposed on leukopenia is unclear).  Will restart acyclovir for now, discuss with ID in am. -morphine prn for pain -patient may benefit from PPI, but must use with caution with PI.  Will avoid for now, discuss with ID in a.m. -maalox prn (though same issue as above) -zofran prn  # Low-grade fever - the patient presents with temperature of 100.3, which may represent low-grade fever in the setting of immunosuppresion.  Differential includes TB vs HSV esophagitis vs IRIS (HAART started 5 days ago; IRIS is usually seen later, but could occur with this time frame) vs other opportunistic infection (less likely given prophy with bactrim, azithromycin). -will monitor for fever > 100.4 overnight -if febrile, will send blood cultures, consider empiric broad-spectrum antibiotics -continue HAART, despite concern for IRIS.  Will avoid starting steroids yet, as the diagnosis is unclear. -continue dapsone and azithromycin for prophy against opportunistic infections.  # Tachycardia - may represent mild volume depletion in the setting of poor PO intake vs febrile response (see above). -NS at 100 cc/hr  # HIV - CD4 = 20, VL = 228K.  Currently on truvada and isentress. -continue HAART  # ?neutropenia - during last hospitalization, the patient was noted  to have WBC counts in the 2's-5's.  At clinic follow-up on 4/18, WBC = 1.7.  Currently 4.3.  This may represent neutropenia (valcyclovir-induced vs immunosuppression from HIV) with current active infection (TB, HIV, HSV esophagitis), vs true improvement in WBC counts after starting HAART. -monitor daily cbc's -if leukocytes fall or patient becomes febrile, will start neutropenic precautions  # Tuberculosis - currently on 4-drug therapy with ethambutol, isoniazid, pyrazinamide, and rifabutin, with  pyridoxine for prevention of peripheral neuropathy with isoniazid. -continue TB treatment medications and pyridoxine -monitor LFT's  # Prophy - lovenox, dapsone, azithromycin, pneumovax   SignedJanalyn Harder 04/08/2012, 9:43 PM     Medical Student Hospital Admission Note Date: 04/08/2012  Patient name: Kelsey Rodriguez Medical record number: [id number] Date of birth: 11-Aug-1991 Age: 21 y.o. Gender: female PCP: Pcp Not In System  Medical Service: Internal Medicine Teaching Service B2  Attending physician:  Dr. Blanch Media     Chief Complaint: "chest pain"  History of Present Illness: Patient is a 21 year old woman with a significant PMH of HIV-1, HSV-1, CMV-IgG positive, Toxo pos, and active TB infection, who was in her usual state of health until 2 days ago (Tuesday) when she woke up with aching 10/10 "chest pain that ran from her throat to her stomach and to her back." She says the pain is worse when she lies back in bed or if she leans forward to pick something up from the floor. Taking a deep breath also increases the pain. She reports pain with swallowing and feeling as if food is getting stuck in the bottom of her throat at her stomach. Because of this she has not eaten much over the last 2 days but has been drinking liquids and soft foods. 2 days ago she called Dr. Feliz Beam office and they scheduled her an appointment for today (4/25). Upon presentation to the office she was found to have a mild fever 100.3 and the continued chest discomfort that she reported an 8/10 today. It is aching in sensation at rest and hurts more when she breathes and is sharp upon deep inspiration. She denies any hematuria, n/v/d or night sweats. She has not had a BM since Tuesday, however she has not eaten any real food since then either. Due to her mild fever and CP she was referred to [facility] for evaluation by IM. She is resting in bed comfortably upon exam and her mom is bedside.  Meds: Medications  Prior to Admission  Medication Sig Dispense Refill  . azithromycin (ZITHROMAX) 600 MG tablet Take 2 tablets (1,200 mg total) by mouth once a week.  10 tablet  0  . emtricitabine-tenofovir (TRUVADA) 200-300 MG per tablet Take 1 tablet by mouth daily.  30 tablet  5  . ENSURE (ENSURE) Take 1 Can by mouth 2 (two) times daily between meals.  237 mL  12  . ethambutol (MYAMBUTOL) 100 MG tablet Take 8 tablets (800 mg total) by mouth daily.      . feeding supplement (ENSURE COMPLETE) LIQD Take 237 mLs by mouth 2 (two) times daily between meals.      [name] isoniazid (NYDRAZID) 300 MG tablet Take 1 tablet (300 mg total) by mouth daily.      . pyrazinamide 500 MG tablet Take 2 tablets (1,000 mg total) by mouth daily.  60 tablet  0  . pyridOXINE (B-6) 50 MG tablet Take 0.5 tablets (25 mg total) by mouth daily.  30 tablet  0  . raltegravir (ISENTRESS)  400 MG tablet Take 1 tablet (400 mg total) by mouth 2 (two) times daily.  60 tablet  5  . rifabutin (MYCOBUTIN) 150 MG capsule Take 2 capsules (300 mg total) by mouth daily.      [name] sulfamethoxazole-trimethoprim (BACTRIM DS) 800-160 MG per tablet Take 1 tablet by mouth daily.  6 tablet  0  . valACYclovir (VALTREX) 500 MG tablet Take 1 tablet (500 mg total) by mouth daily.  30 tablet  5    Allergies: Allergies as of 04/08/2012  . No Known Allergies   Past Medical History  Diagnosis Date  . Tuberculosis   . HIV (human immunodeficiency virus infection) 02/2012   Past Surgical History  Procedure Date  . Esophagogastroduodenoscopy 03/11/2012    Procedure: ESOPHAGOGASTRODUODENOSCOPY (EGD);  Surgeon: Hart Carwin, MD;  Location: [facility] ENDOSCOPY;  Service: Endoscopy;  Laterality: N/A;  . Appendectomy ~ 2000  . Lung biopsy 02/2012   History reviewed. No pertinent family history. History   Social History  . Marital Status: Single    Spouse Name: N/A    Number of Children: N/A  . Years of Education: Graduated high school   Occupational History  . 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 Smoking status: Never Smoker   . Smokeless tobacco: Never Used  . Alcohol Use: No  . Drug Use: No  . Sexually Active: No   Other Topics Concern  . Not on file   Social History Narrative  . Patient is a very pleasant 21 year old woman who is a recent immigrant from [place]. She came to the [place] in August 2011 to live with her mom in [place], who had applied for her visa to come to the states. 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 She has not applied for any jobs yet however.  She does speak english well and only occasionally cannot verbalize what she is trying to say.    Review of Systems: Constitutional: positive for fevers Eyes: negative Ears, nose, mouth, throat, and face: negative Respiratory: negative Cardiovascular: negative Gastrointestinal: positive for abdominal pain and odynophagia Genitourinary:negative Integument/breast: negative Hematologic/lymphatic: negative Musculoskeletal:positive for back pain Neurological: negative Behavioral/Psych: negative Endocrine: negative Allergic/Immunologic: negative  Physical Exam: Blood pressure 106/74, pulse 94, temperature 98.9 F (37.2 C), temperature source Oral, resp. rate 18, height 5\' 5"  (1.651 m), weight 56.6 kg (124 lb 12.5 oz), last menstrual period 03/29/2012, SpO2 100.00%. BP 106/74  Pulse 94  Temp(Src) 98.9 F (37.2 C) (Oral)  Resp 18  Ht 5\' 5"  (1.651 m)  Wt 56.6 kg (124 lb 12.5 oz)  BMI 20.76 kg/m2  SpO2 100%  LMP 03/29/2012 General appearance: alert, cooperative and no distress Head: Normocephalic, without obvious abnormality, atraumatic Eyes: conjunctivae/corneas clear. PERRL, EOM's intact. Fundi benign. Throat: lips, mucosa, and tongue normal; teeth and gums normal Neck: no adenopathy, no carotid bruit, no JVD, supple, symmetrical,  trachea midline and thyroid not enlarged, symmetric, no tenderness/mass/nodules Back: symmetric, no curvature. ROM normal. No CVA tenderness. Lungs: clear to auscultation bilaterally Heart: regular rate and rhythm Abdomen: abnormal findings:  moderate tenderness in the epigastrium Extremities: extremities normal, atraumatic, no cyanosis or edema Skin: Skin color, texture, turgor normal. No rashes or lesions  Lab results: Basic Metabolic Panel:  Basename 04/08/12 1145  NA 136  K 4.1  CL 100  CO2 25  GLUCOSE  82  BUN 6  CREATININE 0.69  CALCIUM 9.5  MG --  PHOS --   Liver Function Tests:  Basename 04/08/12 1145  AST 20  ALT 18  ALKPHOS 92  BILITOT 0.3  PROT 8.1  ALBUMIN 3.3*   No results found for this basename: LIPASE:2,AMYLASE:2 in the last 72 hours No results found for this basename: AMMONIA:2 in the last 72 hours CBC:  Basename 04/08/12 1145  WBC 4.3  NEUTROABS 2.6  HGB 10.2*  HCT 30.6*  MCV 87.9  PLT 163   Cardiac Enzymes: No results found for this basename: CKTOTAL:3,CKMB:3,CKMBINDEX:3,TROPONINI:3 in the last 72 hours BNP: No results found for this basename: PROBNP:3 in the last 72 hours D-Dimer: No results found for this basename: DDIMER:2 in the last 72 hours CBG: No results found for this basename: GLUCAP:6 in the last 72 hours Hemoglobin A1C: No results found for this basename: HGBA1C in the last 72 hours Fasting Lipid Panel: No results found for this basename: CHOL,HDL,LDLCALC,TRIG,CHOLHDL,LDLDIRECT in the last 72 hours Thyroid Function Tests: No results found for this basename: TSH,T4TOTAL,FREET4,T3FREE,THYROIDAB in the last 72 hours Anemia Panel: No results found for this basename: VITAMINB12,FOLATE,FERRITIN,TIBC,IRON,RETICCTPCT in the last 72 hours Coagulation: No results found for this basename: LABPROT:2,INR:2 in the last 72 hours Urine Drug Screen: Drugs of Abuse  No results found for this basename: labopia, cocainscrnur, labbenz,  amphetmu, thcu, labbarb    Alcohol Level: No results found for this basename: ETH:2 in the last 72 hours Urinalysis: No results found for this basename: COLORURINE:2,APPERANCEUR:2,LABSPEC:2,PHURINE:2,GLUCOSEU:2,HGBUR:2,BILIRUBINUR:2,KETONESUR:2,PROTEINUR:2,UROBILINOGEN:2,NITRITE:2,LEUKOCYTESUR:2 in the last 72 hours  Imaging results:  Dg Chest 2 View  04/08/2012  *RADIOLOGY REPORT*  Clinical Data: Chest pain and shortness of breath.  CHEST - 2 VIEW  Comparison: 03/21/2012.  Findings: The cardiac silhouette, mediastinal and hilar contours are stable. The left lung is much better aerated.  There is a persistent soft tissue mass/infiltrate overlying the region of the aortic knob. It has decreased in size.  No pleural effusions or pulmonary edema.  The bony thorax is intact.  The PICC line has been removed.  IMPRESSION: Much improved left lung aeration since the prior chest x-ray. There is a persistent density overlying the aortic knob but it is smaller.  Original Report Authenticated By: P. Loralie Champagne, M.D.    Assessment & Plan by Problem:  Chest discomfort: She experienced this sort of pain recently in March/April during her previous admission. On EGD (03/11/12) she was found to have diffuse esophageal ulcers. She had been being treated with GCV for CMV esophagitis. Due to neutropenia she was then changed to valacyclovir. Esophageal mucosal culture on 03/17/12 revealed HSV-1. -begin acyclovir for treatment of HSV-1 esophagitis -EKG -cycle cardiac enzymes x 3  Mild fever: Likely related to her recent diagnosis of TB. She is on appropriate therapy and has been responding well up to the present. -monitor vitals  Anemia: Hgb 10.2 this morning. No older records than her previous admit on 03/11/12 where it was also found to be 10.4 and this was prior to any TB or HIV therapy. This is likely her baseline 2/2 her immunocompromised state. -CBC daily  Neutropenia: Her WBC count a week ago was 1.7 and  currently is 4.3 upon admission. The penia could be related to her new medications caused suppression of bone marrow. However, differential for climb 2/2 the fall includes infection which could be related to her mild fever that she has developed recently (100.3), however this could also be a component of her TB treatment  and the underlying inflammatory response by her body, IRIS although time frame is too short (just began HIV therapy 5 days ago). -medication management to d/c offending agents -CBC daily  HIV-1: CD4 count on 03/12/12 = 20; Viral load = 228,149. She has just began treatment this past Saturday (5 days ago). -continue Isentress (raltegravir) 400mg  BID -continue Truvada (emtricitabine/tenofovir) 200-300mg  daily  TB: AFB smear on 03/17/12 revealed 4+ AFB. Quantiferon positive 03/17/12. Patient reports having no h/o TB and negative PPD, however this is most likely 2/2 no cell mediated immunity 2/2 HIV infection. She has been on RIPE therapy since 03/18/12. -Rifabutin -Isoniazid -Pyrazinamide -Ethambutol -Pyridoxine  HSV-1: Esophageal mucosal biopsy revealed HSV-1 upon culture on 03/17/12. -acyclovir  CMV: IgM negative, IgG positive (>6.5). 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 She had been on bactrim ds daily, however in light of bone marrow suppression as a common consequence to this med, will choose alternative therapy. -d/c bactrim -begin dapsone  This is a Psychologist, occupational Note.  The care of the patient was discussed with Dr. Janalyn Harder and the assessment and plan was formulated with their assistance.  Please see their note for official documentation of the patient encounter.   SignedLewie Chamber 04/08/2012, 6:18 PM

## 2012-04-08 NOTE — Progress Notes (Signed)
Addended by: Laurell Josephs on: 04/08/2012 04:07 PM   Modules accepted: Orders

## 2012-04-08 NOTE — Progress Notes (Signed)
ANTIBIOTIC CONSULT NOTE - INITIAL  Pharmacy Consult for acyclovir Indication: HSV esophagitis  No Known Allergies  Patient Measurements: Height: 5\' 5"  (165.1 cm) Weight: 124 lb 12.5 oz (56.6 kg) IBW/kg (Calculated) : 57  Adjusted Body Weight:   Vital Signs: Temp: 98.9 F (37.2 C) (04/25 1643) Temp src: Oral (04/25 1643) BP: 106/74 mmHg (04/25 1643) Pulse Rate: 94  (04/25 1643) Intake/Output from previous day:   Intake/Output from this shift:    Labs:  Basename 04/08/12 1145  WBC 4.3  HGB 10.2*  PLT 163  LABCREA --  CREATININE 0.69   Estimated Creatinine Clearance: 97.7 ml/min (by C-G formula based on Cr of 0.69). No results found for this basename: VANCOTROUGH:2,VANCOPEAK:2,VANCORANDOM:2,GENTTROUGH:2,GENTPEAK:2,GENTRANDOM:2,TOBRATROUGH:2,TOBRAPEAK:2,TOBRARND:2,AMIKACINPEAK:2,AMIKACINTROU:2,AMIKACIN:2, in the last 72 hours   Microbiology: Recent Results (from the past 720 hour(s))  HERPES SIMPLEX VIRUS CULTURE     Status: Normal   Collection Time   03/11/12  3:56 PM      Component Value Range Status Comment   Specimen Description ESOPHAGUS   Final    Special Requests NONE   Final    Culture Herpes Simplex Type 1 detected.   Final    Report Status 03/17/2012 FINAL   Final   CULTURE, BLOOD (ROUTINE X 2)     Status: Normal   Collection Time   03/12/12  6:10 AM      Component Value Range Status Comment   Specimen Description BLOOD RIGHT ARM   Final    Special Requests BOTTLES DRAWN AEROBIC AND ANAEROBIC [facility]   Final    Culture  Setup Time [id number]   Final    Culture NO GROWTH 5 DAYS   Final    Report Status 03/18/2012 FINAL   Final   CULTURE, BLOOD (ROUTINE X 2)     Status: Normal   Collection Time   03/12/12  6:20 AM      Component Value Range Status Comment   Specimen Description BLOOD RIGHT WRIST   Final    Special Requests BOTTLES DRAWN AEROBIC AND ANAEROBIC [facility]   Final    Culture  Setup Time [id number]   Final    Culture NO GROWTH 5 DAYS    Final    Report Status 03/18/2012 FINAL   Final   SURGICAL PCR SCREEN     Status: Normal   Collection Time   03/12/12  1:06 PM      Component Value Range Status Comment   MRSA, PCR NEGATIVE  NEGATIVE  Final    Staphylococcus aureus NEGATIVE  NEGATIVE  Final   AFB CULTURE, BLOOD     Status: Normal (Preliminary result)   Collection Time   03/14/12  4:47 PM      Component Value Range Status Comment   Specimen Description BLOOD LEFT ARM   Final    Special Requests BLACK 5CC   Final    Culture     Final    Value: CULTURE WILL BE EXAMINED FOR 6 WEEKS BEFORE ISSUING A FINAL REPORT   Report Status PENDING   Incomplete   SURGICAL PCR SCREEN     Status: Normal   Collection Time   03/16/12 12:20 PM      Component Value Range Status Comment   MRSA, PCR NEGATIVE  NEGATIVE  Final    Staphylococcus aureus NEGATIVE  NEGATIVE  Final   AFB CULTURE WITH SMEAR     Status: Normal (Preliminary result)   Collection Time   03/16/12  5:24 PM  Component Value Range Status Comment   Specimen Description TISSUE   Final    Special Requests     Final    Value: PT ON ZINACEF,AZITHROMYCIN,CYTOVENE,SEPTRA MEDIASTINAL MASS   ACID FAST SMEAR     Final    Value: 4+ ACID FAST BACILLI SEEN CRITICAL RESULT CALLED TO, READ BACK BY AND VERIFIED WITH: DR Jerolyn Center 14:15 03/17/12 GF   Culture     Final    Value: MYCOBACTERIUM TUBERCULOSIS COMPLEX     17 Note: CRITICAL RESULT CALLED TO, READ BACK BY AND VERIFIED WITH: DR Ilsa Iha @ 4:55PM ON 4 13 BY NW CRITICAL RESULT CALLED TO, READ BACK BY AND VERIFIED WITH: HD WANDA TABON @ 5:02PM ON 4 17 13  BY NW FAXED TO [phone]   Report Status PENDING   Incomplete   FUNGUS CULTURE W SMEAR     Status: Normal (Preliminary result)   Collection Time   03/16/12  5:24 PM      Component Value Range Status Comment   Specimen Description TISSUE   Final    Special Requests     Final    Value: PT ON ZINACEF,AZITHROMYCIN,CYTOVENE,SEPTRA MEDIASTINAL MASS   Fungal Smear NO YEAST OR FUNGAL  ELEMENTS SEEN   Final    Culture CULTURE IN PROGRESS FOR FOUR WEEKS   Final    Report Status PENDING   Incomplete   CULTURE, BLOOD (ROUTINE X 2)     Status: Normal   Collection Time   03/19/12 12:35 PM      Component Value Range Status Comment   Specimen Description BLOOD LEFT ARM   Final    Special Requests BOTTLES DRAWN AEROBIC AND ANAEROBIC 10[facility]   Final    Culture  Setup Time [id number]   Final    Culture NO GROWTH 5 DAYS   Final    Report Status 03/25/2012 FINAL   Final   CULTURE, BLOOD (ROUTINE X 2)     Status: Normal   Collection Time   03/19/12 12:50 PM      Component Value Range Status Comment   Specimen Description BLOOD LEFT HAND   Final    Special Requests BOTTLES DRAWN AEROBIC AND ANAEROBIC 10[facility]   Final    Culture  Setup Time [id number]   Final    Culture NO GROWTH 5 DAYS   Final    Report Status 03/25/2012 FINAL   Final   AFB CULTURE WITH SMEAR     Status: Normal (Preliminary result)   Collection Time   03/19/12  3:46 PM      Component Value Range Status Comment   Specimen Description SPUTUM   Final    Special Requests Immunocompromised   Final    ACID FAST SMEAR     Final    Value: SUSPICIOUS AFB SMEAR (1-2 ORGANISMS SEEN ON THE ENTIRE SLIDE;PLEASE SUBMIT ADDITIONAL SPECIMENS).   Culture     Final    Value: CULTURE WILL BE EXAMINED FOR 6 WEEKS BEFORE ISSUING A FINAL REPORT   Report Status PENDING   Incomplete   URINE CULTURE     Status: Normal   Collection Time   03/19/12  3:47 PM      Component Value Range Status Comment   Specimen Description URINE, CLEAN CATCH   Final    Special Requests Immunocompromised   Final    Culture  Setup Time [id number]   Final    Colony Count NO GROWTH   Final    Culture NO GROWTH  Final    Report Status 03/21/2012 FINAL   Final     Medical History: Past Medical History  Diagnosis Date  . Tuberculosis   . HIV (human immunodeficiency virus infection) 02/2012    Medications:  Scheduled:    . azithromycin  1,200 mg Oral  Weekly  . dapsone  100 mg Oral Daily  . emtricitabine-tenofovir  1 tablet Oral Daily  . enoxaparin  40 mg Subcutaneous Q24H  . ethambutol  800 mg Oral Daily  . feeding supplement  237 mL Oral BID BM  . isoniazid  300 mg Oral Daily  . pneumococcal 23 valent vaccine  0.5 mL Intramuscular Tomorrow-1000  . pyrazinamide  1,000 mg Oral Daily  . pyridOXINE  25 mg Oral Daily  . raltegravir  400 mg Oral BID  . rifabutin  300 mg Oral Daily   Assessment: 47 YOF with known HIV, active TB presents with chest discomfort either possibly from mediastinal mass vs esophagitis (s/p EGD with culture of esophagus positive for HSV).  Appears she recently finished course of valacyclovir as outpatient.  Clarified route to dose acyclovir as IV d/t dysphagia   Plan:  1. Acyclovir 5mg /kg IV q8h  Dannielle Huh 04/08/2012,9:33 PM

## 2012-04-09 DIAGNOSIS — K209 Esophagitis, unspecified without bleeding: Secondary | ICD-10-CM

## 2012-04-09 DIAGNOSIS — B009 Herpesviral infection, unspecified: Secondary | ICD-10-CM

## 2012-04-09 LAB — COMPREHENSIVE METABOLIC PANEL
ALT: 19 U/L (ref 0–35)
AST: 31 U/L (ref 0–37)
Albumin: 3.4 g/dL — ABNORMAL LOW (ref 3.5–5.2)
Alkaline Phosphatase: 93 U/L (ref 39–117)
BUN: 12 mg/dL (ref 6–23)
CO2: 23 mEq/L (ref 19–32)
Calcium: 9.5 mg/dL (ref 8.4–10.5)
Chloride: 102 mEq/L (ref 96–112)
Creatinine, Ser: 1.05 mg/dL (ref 0.50–1.10)
GFR calc Af Amer: 86 mL/min — ABNORMAL LOW (ref >90)
GFR calc non Af Amer: 74 mL/min — ABNORMAL LOW (ref >90)
Glucose, Bld: 94 mg/dL (ref 70–99)
Potassium: 4.4 mEq/L (ref 3.5–5.1)
Sodium: 138 mEq/L (ref 135–145)
Total Bilirubin: 0.4 mg/dL (ref 0.3–1.2)
Total Protein: 8.6 g/dL — ABNORMAL HIGH (ref 6.0–8.3)

## 2012-04-09 LAB — CARDIAC PANEL(CRET KIN+CKTOT+MB+TROPI)
CK, MB: 1 ng/mL (ref 0.3–4.0)
CK, MB: 1 ng/mL (ref 0.3–4.0)
Relative Index: INVALID (ref 0.0–2.5)
Relative Index: INVALID (ref 0.0–2.5)
Total CK: 66 U/L (ref 7–177)
Total CK: 67 U/L (ref 7–177)
Troponin I: <0.3 ng/mL (ref <0.30)
Troponin I: <0.3 ng/mL (ref <0.30)

## 2012-04-09 LAB — CBC
HCT: 32.9 % — ABNORMAL LOW (ref 36.0–46.0)
Hemoglobin: 11.1 g/dL — ABNORMAL LOW (ref 12.0–15.0)
MCH: 29.9 pg (ref 26.0–34.0)
MCHC: 33.7 g/dL (ref 30.0–36.0)
MCV: 88.7 fL (ref 78.0–100.0)
Platelets: 174 10*3/uL (ref 150–400)
RBC: 3.71 MIL/uL — ABNORMAL LOW (ref 3.87–5.11)
RDW: 15.8 % — ABNORMAL HIGH (ref 11.5–15.5)
WBC: 4.8 10*3/uL (ref 4.0–10.5)

## 2012-04-09 MED ORDER — BISMUTH SUBSALICYLATE 262 MG/15ML PO SUSP
30.0000 mL | Freq: Two times a day (BID) | ORAL | Status: DC
  Administered 2012-04-09: 30 mL via ORAL
  Filled 2012-04-09: qty 236

## 2012-04-09 MED ORDER — VANCOMYCIN HCL 1000 MG IV SOLR
750.0000 mg | Freq: Two times a day (BID) | INTRAVENOUS | Status: DC
  Administered 2012-04-09: 750 mg via INTRAVENOUS
  Filled 2012-04-09 (×2): qty 750

## 2012-04-09 MED ORDER — ADULT MULTIVITAMIN W/MINERALS CH
1.0000 | ORAL_TABLET | Freq: Every day | ORAL | Status: DC
  Administered 2012-04-09 – 2012-04-12 (×4): 1 via ORAL
  Filled 2012-04-09 (×4): qty 1

## 2012-04-09 MED ORDER — PIPERACILLIN-TAZOBACTAM 3.375 G IVPB
3.3750 g | Freq: Four times a day (QID) | INTRAVENOUS | Status: DC
  Administered 2012-04-09: 3.375 g via INTRAVENOUS
  Filled 2012-04-09 (×3): qty 50

## 2012-04-09 MED ORDER — PANTOPRAZOLE SODIUM 40 MG PO TBEC
40.0000 mg | DELAYED_RELEASE_TABLET | Freq: Every day | ORAL | Status: DC
  Administered 2012-04-09 – 2012-04-12 (×4): 40 mg via ORAL
  Filled 2012-04-09 (×3): qty 1

## 2012-04-09 NOTE — Progress Notes (Signed)
ANTIBIOTIC CONSULT NOTE - INITIAL  Pharmacy Consult for vancomycin and zosyn  Indication: neutropenic fevers.   No Known Allergies  Patient Measurements: Height: 5\' 5"  (165.1 cm) Weight: 124 lb 12.5 oz (56.6 kg) IBW/kg (Calculated) : 57  Adjusted Body Weight:   Vital Signs: Temp: 101.4 F (38.6 C) (04/26 0500) Temp src: Oral (04/26 0500) BP: 107/74 mmHg (04/26 0500) Pulse Rate: 107  (04/26 0500) Intake/Output from previous day:   Intake/Output from this shift:    Labs:  Basename 04/09/12 0321 04/09/12 0314 04/08/12 1145  WBC 4.8 -- 4.3  HGB 11.1* -- 10.2*  PLT 174 -- 163  LABCREA -- -- --  CREATININE -- 1.05 0.69   Estimated Creatinine Clearance: 74.5 ml/min (by C-G formula based on Cr of 1.05). No results found for this basename: VANCOTROUGH:2,VANCOPEAK:2,VANCORANDOM:2,GENTTROUGH:2,GENTPEAK:2,GENTRANDOM:2,TOBRATROUGH:2,TOBRAPEAK:2,TOBRARND:2,AMIKACINPEAK:2,AMIKACINTROU:2,AMIKACIN:2, in the last 72 hours   Microbiology: Recent Results (from the past 720 hour(s))  HERPES SIMPLEX VIRUS CULTURE     Status: Normal   Collection Time   03/11/12  3:56 PM      Component Value Range Status Comment   Specimen Description ESOPHAGUS   Final    Special Requests NONE   Final    Culture Herpes Simplex Type 1 detected.   Final    Report Status 03/17/2012 FINAL   Final   CULTURE, BLOOD (ROUTINE X 2)     Status: Normal   Collection Time   03/12/12  6:10 AM      Component Value Range Status Comment   Specimen Description BLOOD RIGHT ARM   Final    Special Requests BOTTLES DRAWN AEROBIC AND ANAEROBIC [facility]   Final    Culture  Setup Time [id number]   Final    Culture NO GROWTH 5 DAYS   Final    Report Status 03/18/2012 FINAL   Final   CULTURE, BLOOD (ROUTINE X 2)     Status: Normal   Collection Time   03/12/12  6:20 AM      Component Value Range Status Comment   Specimen Description BLOOD RIGHT WRIST   Final    Special Requests BOTTLES DRAWN AEROBIC AND ANAEROBIC [facility]    Final    Culture  Setup Time [id number]   Final    Culture NO GROWTH 5 DAYS   Final    Report Status 03/18/2012 FINAL   Final   SURGICAL PCR SCREEN     Status: Normal   Collection Time   03/12/12  1:06 PM      Component Value Range Status Comment   MRSA, PCR NEGATIVE  NEGATIVE  Final    Staphylococcus aureus NEGATIVE  NEGATIVE  Final   AFB CULTURE, BLOOD     Status: Normal (Preliminary result)   Collection Time   03/14/12  4:47 PM      Component Value Range Status Comment   Specimen Description BLOOD LEFT ARM   Final    Special Requests BLACK 5CC   Final    Culture     Final    Value: CULTURE WILL BE EXAMINED FOR 6 WEEKS BEFORE ISSUING A FINAL REPORT   Report Status PENDING   Incomplete   SURGICAL PCR SCREEN     Status: Normal   Collection Time   03/16/12 12:20 PM      Component Value Range Status Comment   MRSA, PCR NEGATIVE  NEGATIVE  Final    Staphylococcus aureus NEGATIVE  NEGATIVE  Final   AFB CULTURE WITH SMEAR  Status: Normal (Preliminary result)   Collection Time   03/16/12  5:24 PM      Component Value Range Status Comment   Specimen Description TISSUE   Final    Special Requests     Final    Value: PT ON ZINACEF,AZITHROMYCIN,CYTOVENE,SEPTRA MEDIASTINAL MASS   ACID FAST SMEAR     Final    Value: 4+ ACID FAST BACILLI SEEN CRITICAL RESULT CALLED TO, READ BACK BY AND VERIFIED WITH: DR Jerolyn Center 14:15 03/17/12 GF   Culture     Final    Value: MYCOBACTERIUM TUBERCULOSIS COMPLEX     17 Note: CRITICAL RESULT CALLED TO, READ BACK BY AND VERIFIED WITH: DR Ilsa Iha @ 4:55PM ON 4 13 BY NW CRITICAL RESULT CALLED TO, READ BACK BY AND VERIFIED WITH: HD WANDA TABON @ 5:02PM ON 4 17 13  BY NW FAXED TO [phone]   Report Status PENDING   Incomplete   FUNGUS CULTURE W SMEAR     Status: Normal (Preliminary result)   Collection Time   03/16/12  5:24 PM      Component Value Range Status Comment   Specimen Description TISSUE   Final    Special Requests     Final    Value: PT ON  ZINACEF,AZITHROMYCIN,CYTOVENE,SEPTRA MEDIASTINAL MASS   Fungal Smear NO YEAST OR FUNGAL ELEMENTS SEEN   Final    Culture CULTURE IN PROGRESS FOR FOUR WEEKS   Final    Report Status PENDING   Incomplete   CULTURE, BLOOD (ROUTINE X 2)     Status: Normal   Collection Time   03/19/12 12:35 PM      Component Value Range Status Comment   Specimen Description BLOOD LEFT ARM   Final    Special Requests BOTTLES DRAWN AEROBIC AND ANAEROBIC [facility]   Final    Culture  Setup Time [id number]   Final    Culture NO GROWTH 5 DAYS   Final    Report Status 03/25/2012 FINAL   Final   CULTURE, BLOOD (ROUTINE X 2)     Status: Normal   Collection Time   03/19/12 12:50 PM      Component Value Range Status Comment   Specimen Description BLOOD LEFT HAND   Final    Special Requests BOTTLES DRAWN AEROBIC AND ANAEROBIC 10[facility]   Final    Culture  Setup Time [id number]   Final    Culture NO GROWTH 5 DAYS   Final    Report Status 03/25/2012 FINAL   Final   AFB CULTURE WITH SMEAR     Status: Normal (Preliminary result)   Collection Time   03/19/12  3:46 PM      Component Value Range Status Comment   Specimen Description SPUTUM   Final    Special Requests Immunocompromised   Final    ACID FAST SMEAR     Final    Value: SUSPICIOUS AFB SMEAR (1-2 ORGANISMS SEEN ON THE ENTIRE SLIDE;PLEASE SUBMIT ADDITIONAL SPECIMENS).   Culture     Final    Value: CULTURE WILL BE EXAMINED FOR 6 WEEKS BEFORE ISSUING A FINAL REPORT   Report Status PENDING   Incomplete   URINE CULTURE     Status: Normal   Collection Time   03/19/12  3:47 PM      Component Value Range Status Comment   Specimen Description URINE, CLEAN CATCH   Final    Special Requests Immunocompromised   Final    Culture  Setup Time [id number]  Final    Colony Count NO GROWTH   Final    Culture NO GROWTH   Final    Report Status 03/21/2012 FINAL   Final     Medical History: Past Medical History  Diagnosis Date  . Tuberculosis   . HIV (human immunodeficiency virus  infection) 02/2012    Medications:  Prescriptions prior to admission  Medication Sig Dispense Refill  . azithromycin (ZITHROMAX) 600 MG tablet Take 2 tablets (1,200 mg total) by mouth once a week.  10 tablet  0  . emtricitabine-tenofovir (TRUVADA) 200-300 MG per tablet Take 1 tablet by mouth daily.  30 tablet  5  . ENSURE (ENSURE) Take 1 Can by mouth 2 (two) times daily between meals.  237 mL  12  . ethambutol (MYAMBUTOL) 100 MG tablet Take 8 tablets (800 mg total) by mouth daily.      . feeding supplement (ENSURE COMPLETE) LIQD Take 237 mLs by mouth 2 (two) times daily between meals.      [name] isoniazid (NYDRAZID) 300 MG tablet Take 1 tablet (300 mg total) by mouth daily.      . pyrazinamide 500 MG tablet Take 2 tablets (1,000 mg total) by mouth daily.  60 tablet  0  . pyridOXINE (B-6) 50 MG tablet Take 0.5 tablets (25 mg total) by mouth daily.  30 tablet  0  . raltegravir (ISENTRESS) 400 MG tablet Take 1 tablet (400 mg total) by mouth 2 (two) times daily.  60 tablet  5  . rifabutin (MYCOBUTIN) 150 MG capsule Take 2 capsules (300 mg total) by mouth daily.      [name] sulfamethoxazole-trimethoprim (BACTRIM DS) 800-160 MG per tablet Take 1 tablet by mouth daily.  6 tablet  0  . valACYclovir (VALTREX) 500 MG tablet Take 1 tablet (500 mg total) by mouth daily.  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 vanc and zosyn for empiric cvg.   Goal of Therapy:  Vancomycin trough level 15-20 mcg/ml  Plan:  Vancomycin 750mg  q12h zosyn 3.375 q8h   Janice Coffin 04/09/2012,6:56 AM

## 2012-04-09 NOTE — Progress Notes (Signed)
INITIAL ADULT NUTRITION ASSESSMENT Date: 04/09/2012   Time: 3:18 PM Reason for Assessment: Nutrition Risk, weight loss  ASSESSMENT: Female 21 y.o.  Dx: Herpes simplex esophagitis  Hx:  Past Medical History  Diagnosis Date  . Tuberculosis   . HIV (human immunodeficiency virus infection) 02/2012    Related Meds:     . acyclovir  5 mg/kg Intravenous Q8H  . azithromycin  1,200 mg Oral Weekly  . bismuth subsalicylate  30 mL Oral BID  . dapsone  100 mg Oral Daily  . emtricitabine-tenofovir  1 tablet Oral Daily  . enoxaparin  40 mg Subcutaneous Q24H  . ethambutol  800 mg Oral Daily  . feeding supplement  237 mL Oral BID BM  . isoniazid  300 mg Oral Daily  . pantoprazole  40 mg Oral Q1200  . pneumococcal 23 valent vaccine  0.5 mL Intramuscular Tomorrow-1000  . pyrazinamide  1,000 mg Oral Daily  . pyridOXINE  25 mg Oral Daily  . raltegravir  400 mg Oral BID  . rifabutin  300 mg Oral Daily  . DISCONTD: piperacillin-tazobactam (ZOSYN)  IV  3.375 g Intravenous Q6H  . DISCONTD: pyridOXINE  25 mg Oral Daily  . DISCONTD: vancomycin  750 mg Intravenous Q12H     Ht: 5\' 5"  (165.1 cm)  Wt: 124 lb 12.5 oz (56.6 kg)  Ideal Wt: 57 kg  % Ideal Wt: 99%  Usual Wt: 130 lbs Wt Readings from Last 3 Encounters:  04/08/12 124 lb 12.5 oz (56.6 kg)  04/08/12 124 lb (56.246 kg)  04/01/12 125 lb (56.7 kg)  03/11/12  127 lbs % Usual Wt: 95%  Body mass index is 20.76 kg/(m^2). WNL  Food/Nutrition Related Hx: Pt reports increased nausea and decreased appetite  Labs:  CMP     Component Value Date/Time   NA 138 04/09/2012 0314   K 4.4 04/09/2012 0314   CL 102 04/09/2012 0314   CO2 23 04/09/2012 0314   GLUCOSE 94 04/09/2012 0314   BUN 12 04/09/2012 0314   CREATININE 1.05 04/09/2012 0314   CREATININE 0.58 04/01/2012 1506   CALCIUM 9.5 04/09/2012 0314   PROT 8.6* 04/09/2012 0314   ALBUMIN 3.4* 04/09/2012 0314   AST 31 04/09/2012 0314   ALT 19 04/09/2012 0314   ALKPHOS 93 04/09/2012 0314   BILITOT  0.4 04/09/2012 0314   GFRNONAA 74* 04/09/2012 0314   GFRAA 86* 04/09/2012 0314    Intake/Output Summary (Last 24 hours) at 04/09/12 1523 Last data filed at 04/09/12 0950  Gross per 24 hour  Intake 1546.73 ml  Output      0 ml  Net 1546.73 ml     Diet Order: General  Supplements/Tube Feeding: Ensure Complete BID between meals  IVF:    sodium chloride Last Rate: 100 mL/hr at 04/09/12 1454    Estimated Nutritional Needs:   Kcal: 1900-2100 Protein: 60-70 Fluid:  1.9 - 2.1 L  Pt known to RD from recent admission. Pt has poor po intake due to difficulty swallowing. Now pt has decreased appetite from nausea. Pt thinks it is related to her medication. Noted, both her HIV medications which she started several days ago have N/V as common side effects. States she has ben given something for nausea but it only helps a little. Pt has continued to drink Ensure at home, BID.  Pt has lost an additional 3 lbs since discharge on 4/11 (2.3% in 2 weeks), significant weight loss. Pt likely has some level of malnutrition, though was drinking  2 Ensure Complete (70 kcal and 26 gm protein total) daily.   NUTRITION DIAGNOSIS: -Inadequate oral intake (NI-2.1).  Status: Ongoing  RELATED TO: N/V  AS EVIDENCE BY: weight loss, 2.3% in 2 weeks  MONITORING/EVALUATION(Goals): Goal: PO intake will met >90% of estimated nutrition needs Monitor: PO intake, N/V, weight, labs, I/O's  EDUCATION NEEDS: -No education needs identified at this time  INTERVENTION: 1. Agree with Ensure Complete BID, continue 2. RD took food preferences, will add to [employer] [facility] program for meals 3. RD will also adult multivitamin daily 4. RD will continue to follow  Dietitian [phone]  DOCUMENTATION CODES Per approved criteria  -Not Applicable    Clarene Duke MARIE 04/09/2012, 3:18 PM

## 2012-04-09 NOTE — Progress Notes (Signed)
Medical Student Daily Progress Note  Subjective: Patient reports doing a little better this morning. She slept fairly well overnight. This morning she ranks her pain 3/10 and says that it only hurts when she takes a deep breath or has to swallow her many pills. Laying in bed does not give her the aching sensation that she was reporting so severely yesterday on admission. She did report having an episode of night sweating and nausea last night but no chills, vomiting or diarrhea. She is resting comfortably in bed upon exam.  Objective: Vital signs in last 24 hours: Filed Vitals:   04/08/12 1643 04/08/12 2300 04/09/12 0500 04/09/12 0650  BP: 106/74 130/88 107/74   Pulse: 94 99 107   Temp: 98.9 F (37.2 C) 98.2 F (36.8 C) 101.4 F (38.6 C) 99.4 F (37.4 C)  TempSrc: Oral Oral Oral   Resp: 18 18 14    Height: 5\' 5"  (1.651 m)     Weight: 56.6 kg (124 lb 12.5 oz)     SpO2: 100% 100% 98%    Weight change:   Intake/Output Summary (Last 24 hours) at 04/09/12 1232 Last data filed at 04/09/12 0950  Gross per 24 hour  Intake 1546.73 ml  Output      0 ml  Net 1546.73 ml   Physical Exam: BP 107/74  Pulse 107  Temp(Src) 99.4 F (37.4 C) (Oral)  Resp 14  Ht 5\' 5"  (1.651 m)  Wt 56.6 kg (124 lb 12.5 oz)  BMI 20.76 kg/m2  SpO2 98%  LMP 03/29/2012 General appearance: alert, cooperative and no distress Head: Normocephalic, without obvious abnormality, atraumatic Eyes: conjunctivae/corneas clear. PERRL, EOM's intact. Fundi benign. Throat: lips, mucosa, and tongue normal; teeth and gums normal Neck: no adenopathy, no carotid bruit, no JVD, supple, symmetrical, trachea midline and thyroid not enlarged, symmetric, no tenderness/mass/nodules Back: symmetric, no curvature. ROM normal. No CVA tenderness. Lungs: clear to auscultation bilaterally Heart: regular rate and rhythm Abdomen: abnormal findings:  moderate pain on deep palpation tenderness in the epigastrium Extremities: extremities  normal, atraumatic, no cyanosis or edema Skin: Skin color, texture, turgor normal. No rashes or lesions Lab Results: Basic Metabolic Panel:  Lab 04/09/12 1610 04/08/12 1145  NA 138 136  K 4.4 4.1  CL 102 100  CO2 23 25  GLUCOSE 94 82  BUN 12 6  CREATININE 1.05 0.69  CALCIUM 9.5 9.5  MG -- --  PHOS -- --   Liver Function Tests:  Lab 04/09/12 0314 04/08/12 1145  AST 31 20  ALT 19 18  ALKPHOS 93 92  BILITOT 0.4 0.3  PROT 8.6* 8.1  ALBUMIN 3.4* 3.3*   No results found for this basename: LIPASE:2,AMYLASE:2 in the last 168 hours No results found for this basename: AMMONIA:2 in the last 168 hours CBC:  Lab 04/09/12 0321 04/08/12 1145  WBC 4.8 4.3  NEUTROABS -- 2.6  HGB 11.1* 10.2*  HCT 32.9* 30.6*  MCV 88.7 87.9  PLT 174 163   Cardiac Enzymes:  Lab 04/09/12 0929 04/09/12 0313 04/08/12 2123  CKTOTAL 67 66 55  CKMB 1.0 1.0 0.9  CKMBINDEX -- -- --  TROPONINI <0.30 <0.30 <0.30   BNP: No results found for this basename: PROBNP:3 in the last 168 hours D-Dimer: No results found for this basename: DDIMER:2 in the last 168 hours CBG: No results found for this basename: GLUCAP:6 in the last 168 hours Hemoglobin A1C: No results found for this basename: HGBA1C in the last 168 hours Fasting Lipid Panel: No  results found for this basename: CHOL,HDL,LDLCALC,TRIG,CHOLHDL,LDLDIRECT in the last 213 hours Thyroid Function Tests: No results found for this basename: TSH,T4TOTAL,FREET4,T3FREE,THYROIDAB in the last 168 hours Coagulation: No results found for this basename: LABPROT:4,INR:4 in the last 168 hours Anemia Panel: No results found for this basename: VITAMINB12,FOLATE,FERRITIN,TIBC,IRON,RETICCTPCT in the last 168 hours Urine Drug Screen: Drugs of Abuse  No results found for this basename: labopia, cocainscrnur, labbenz, amphetmu, thcu, labbarb    Alcohol Level: No results found for this basename: ETH:2 in the last 168 hours Urinalysis: No results found for this  basename: COLORURINE:2,APPERANCEUR:2,LABSPEC:2,PHURINE:2,GLUCOSEU:2,HGBUR:2,BILIRUBINUR:2,KETONESUR:2,PROTEINUR:2,UROBILINOGEN:2,NITRITE:2,LEUKOCYTESUR:2 in the last 168 hours  Micro Results: No results found for this or any previous visit (from the past 240 hour(s)). Studies/Results: Dg Chest 2 View  04/08/2012  *RADIOLOGY REPORT*  Clinical Data: Chest pain and shortness of breath.  CHEST - 2 VIEW  Comparison: 03/21/2012.  Findings: The cardiac silhouette, mediastinal and hilar contours are stable. The left lung is much better aerated.  There is a persistent soft tissue mass/infiltrate overlying the region of the aortic knob. It has decreased in size.  No pleural effusions or pulmonary edema.  The bony thorax is intact.  The PICC line has been removed.  IMPRESSION: Much improved left lung aeration since the prior chest x-ray. There is a persistent density overlying the aortic knob but it is smaller.  Original Report Authenticated By: P. Loralie Champagne, M.D.   Medications:  I have reviewed the patient's current medications. Prior to Admission:  Prescriptions prior to admission  Medication Sig Dispense Refill  . azithromycin (ZITHROMAX) 600 MG tablet Take 2 tablets (1,200 mg total) by mouth once a week.  10 tablet  0  . emtricitabine-tenofovir (TRUVADA) 200-300 MG per tablet Take 1 tablet by mouth daily.  30 tablet  5  . ENSURE (ENSURE) Take 1 Can by mouth 2 (two) times daily between meals.  237 mL  12  . ethambutol (MYAMBUTOL) 100 MG tablet Take 8 tablets (800 mg total) by mouth daily.      . feeding supplement (ENSURE COMPLETE) LIQD Take 237 mLs by mouth 2 (two) times daily between meals.      [name] isoniazid (NYDRAZID) 300 MG tablet Take 1 tablet (300 mg total) by mouth daily.      . pyrazinamide 500 MG tablet Take 2 tablets (1,000 mg total) by mouth daily.  60 tablet  0  . pyridOXINE (B-6) 50 MG tablet Take 0.5 tablets (25 mg total) by mouth daily.  30 tablet  0  . raltegravir (ISENTRESS) 400 MG  tablet Take 1 tablet (400 mg total) by mouth 2 (two) times daily.  60 tablet  5  . rifabutin (MYCOBUTIN) 150 MG capsule Take 2 capsules (300 mg total) by mouth daily.      [name] sulfamethoxazole-trimethoprim (BACTRIM DS) 800-160 MG per tablet Take 1 tablet by mouth daily.  6 tablet  0  . valACYclovir (VALTREX) 500 MG tablet Take 1 tablet (500 mg total) by mouth daily.  30 tablet  5   Anti-infectives     Start     Dose/Rate Route Frequency Ordered Stop   04/09/12 1000  emtricitabine-tenofovir (TRUVADA) 200-300 MG per tablet 1 tablet       1 tablet Oral Daily 04/08/12 2120     04/09/12 1000   azithromycin (ZITHROMAX) tablet 1,200 mg        1,200 mg Oral Weekly 04/08/12 2120     04/09/12 0800   vancomycin (VANCOCIN) 750 mg in sodium chloride 0.9 % 150  mL IVPB  Status:  Discontinued        750 mg 150 mL/hr over 60 Minutes Intravenous Every 12 hours 04/09/12 0700 04/09/12 0808   04/09/12 0745   piperacillin-tazobactam (ZOSYN) IVPB 3.375 g  Status:  Discontinued        3.375 g 12.5 mL/hr over 240 Minutes Intravenous 4 times per day 04/09/12 0700 04/09/12 0808   04/08/12 2300   acyclovir (ZOVIRAX) 285 mg in dextrose 5 % 100 mL IVPB        5 mg/kg  56.6 kg 105.7 mL/hr over 60 Minutes Intravenous Every 8 hours 04/08/12 2146     04/08/12 2200   ethambutol (MYAMBUTOL) tablet 800 mg        800 mg Oral Daily 04/08/12 2120     04/08/12 2200   pyrazinamide tablet 1,000 mg        1,000 mg Oral Daily 04/08/12 2120     04/08/12 2200   raltegravir (ISENTRESS) tablet 400 mg        400 mg Oral 2 times daily 04/08/12 2120     04/08/12 2200   isoniazid (NYDRAZID) tablet 300 mg        300 mg Oral Daily 04/08/12 2120     04/08/12 2200   rifabutin (MYCOBUTIN) capsule 300 mg        300 mg Oral Daily 04/08/12 2120     04/08/12 2200   dapsone tablet 100 mg        100 mg Oral Daily 04/08/12 2120           Scheduled Meds:   . acyclovir  5 mg/kg Intravenous Q8H  . azithromycin  1,200 mg Oral Weekly    . dapsone  100 mg Oral Daily  . emtricitabine-tenofovir  1 tablet Oral Daily  . enoxaparin  40 mg Subcutaneous Q24H  . ethambutol  800 mg Oral Daily  . feeding supplement  237 mL Oral BID BM  . isoniazid  300 mg Oral Daily  . pneumococcal 23 valent vaccine  0.5 mL Intramuscular Tomorrow-1000  . pyrazinamide  1,000 mg Oral Daily  . pyridOXINE  25 mg Oral Daily  . raltegravir  400 mg Oral BID  . rifabutin  300 mg Oral Daily  . DISCONTD: piperacillin-tazobactam (ZOSYN)  IV  3.375 g Intravenous Q6H  . DISCONTD: pyridOXINE  25 mg Oral Daily  . DISCONTD: vancomycin  750 mg Intravenous Q12H   Continuous Infusions:   . sodium chloride 100 mL/hr at 04/08/12 2300   PRN Meds:.acetaminophen, acetaminophen, alum & mag hydroxide-simeth, morphine, ondansetron (ZOFRAN) IV, ondansetron Assessment/Plan:  Chest discomfort/dysphagia: The pain she described on admission was the same she had experienced on her previous admission in March/April. EGD on 3/28 revealed multiple esophageal ulcers. Initial cultures were negativefor HSV-1, and empiric coverage for CMV esophagitis was started with GCV. 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 Blood work later revealed patient to be neutropenic likely related to GCV and was changed to valacyclovir. Patient presents to the hospital with unresolved odynophagia. Her pain/dysphagia is still most likely related to her resolving esophageal ulcers from her previous admit. -d/c home valacyclovir -start acyclovir -EKG: negative -cardiac enzymes x3: negative -zofran prn -morphine prn -regular diet  Fever: She spiked a fever early this morning (101.4). Likely this is related to her current TB state versus IRIS. -continue to monitor vitals  Anemia: Hgb 11.1 this morning. No older records than her previous admit on 03/11/12 where it was also found to be  10.4 and this was prior to any TB or HIV therapy. This is likely her baseline 2/2 her immunocompromised state.  -CBC  daily  Serum creatinine rise: She rose from 0.69 to 1.05 overnight. Was receiving multiple drugs as well as decreased po intake past 2 days. -Check urine sodium and creatinine  Neutropenia: Her WBC count a week ago was 1.7 and currently is 4.8. The penia could be related to her new medications having caused suppression of the bone marrow. However, differential for climb 2/2 the fall includes infection which could be related to her fever that she has developed recently (101.4), however this could also be a component of her TB treatment and the underlying inflammatory response by her body, IRIS although time frame is too short (just began HIV therapy 6 days ago).  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 She has just began treatment this past Saturday (6 days ago).  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 Quantiferon positive 03/17/12. Patient reports having no h/o TB and negative PPD, however this is most likely 2/2 no cell mediated immunity 2/2 HIV infection. She has been on RIPE therapy since 03/18/12.  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  -acyclovir   CMV: IgM negative, IgG positive (>6.5). 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 She had been on bactrim ds daily, however in light of bone marrow suppression as a common consequence to this med, will choose alternative therapy.  -d/c bactrim  -begin dapsone   LOS: 1 day   This is a Psychologist, occupational Note.  The care of the patient was discussed with Dr. Janalyn Harder and the assessment and plan formulated with their assistance.  Please see their attached note for official documentation of the daily  encounter.  Lewie Chamber 04/09/2012, 12:32 PM

## 2012-04-09 NOTE — H&P (Signed)
Internal Medicine Teaching Service Attending Note Date: 04/09/2012  Patient name: Kelsey Rodriguez  Medical record number: [id number]  Date of birth: [birth year]   I have seen and evaluated [name] and discussed their care with the Residency Team. Please see Dr Theora Gianotti H&P for full details. Ms [name] is a 21 yo woman who moved here from [place] in 2011 to live with her mother. 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   Pt was seen in [facility] on day prior to admit for a constant CP, fevers, ana anorexia since starting her anti-virals. She received Toradol in [facility] and currently only has pain when she takes a deep breath. No dysphagia.   Her mom is HIV negative and knows the pt's HIV status. PT completed a CNA course and wants to take additional medical courses like maybe MA classes.   On exam, she remains febrile, tachycardic, and is sat normally.  She appears in pain by facial expressions. No resp distress. No thrush. HRRR LCTAB Neuro no focal  Labs reviewed  Assessment and Plan: I agree with the formulated Assessment and Plan with the following changes:  1. AIDS - anti-virals and prophylaxis continued. 2. Pul TB - resp isolation, cont meds.  3. CP - now resolved per pt although she continues to appear to be in pain. Received full tx course for HSV. Was able to take PO. If remains symptomatic, may need repeat EGD. She has APAP and morphine as needed. 4. Febrile illness - blood cxs are pending. ABX were stopped. We have several sources of fever - AIDS, TB, possible viral esophagitis, IRIS. Will appreciate ID input.  5. Normocytic anemia with increased RDW - mild and stable. Had ferritin of 57 so not extremely useful but iron studies c/w inflammatory anemia.   Continue current therapy and await ID  input.

## 2012-04-09 NOTE — Progress Notes (Addendum)
PGY1 Addendum I agree with excellent MS3 note above.  I have seen and examined patient with MS3. S:  Patient's chest and abdominal pain have somewhat improved this morning, though she still notes chest pain when taking a deep breath.  The patient spiked a fever of 101.4 overnight, so blood cultures were collected this morning, and the patient was initially started on Vanc/Zosyn.  After discussion with ID, these medications were discontinued.  Patient feeling better overall.  O:  General: alert, cooperative, and in no apparent distress HEENT: pupils equal round and reactive to light, vision grossly intact, oropharynx clear and non-erythematous  Neck: supple, no lymphadenopathy Lungs: clear to ascultation bilaterally, normal work of respiration, no wheezes, rales, ronchi. Chest tender to sternal palpation Heart: tachycardic, regular rhythm, no murmurs, gallops, or rubs Abdomen: soft, moderately tender to epigastric palpation, non-distended, normal bowel sounds  Extremities: no cyanosis, clubbing, or edema Neurologic: alert & oriented X3, cranial nerves II-XII intact, strength grossly intact, sensation intact to light touch  I have reviewed labs and imaging.  A/P: The patient is a 21 yo Kelsey Rodriguez, history of HIV (CD4 = 20), TB, and HSV esophagitis, presenting with epigastric pain and dysphagia, likely representing continued HSV esophagitis.   # Dysphagia/abd pain - diagnosed by HSV culture of esophageal tissue biopsy, treated with 21-day course of valacyclovir (4/3-4/25). However, patient still experiencing symptoms persistent HSV esophagitis. Other potential etiologies include GERD vs candidal esophagitis vs medication side effect.  Symptoms somewhat improved this morning.  -continue acyclovir, likely change to PO tomorrow if still tolerating PO medications well -morphine prn for pain  -start protonix daily, pepto-bismol BID -maalox prn -zofran prn   # Fever - the patient presents with  temperature of 100.3, which overnight increased to 101.4.  Fever likely represents known infections (TB vs HSV esophagitis) vs IRIS. -blood cultures drawn this morning -patient given 1 dose of vanc/zosyn this morning, but this was discontinued per ID recs -continue HAART, despite concern for IRIS. Will avoid starting steroids for now, as diagnosis is unclear.  -continue dapsone and azithromycin for prophy  # Tachycardia - may represent mild volume depletion in the setting of poor PO intake vs febrile response (see above).  Tachycardia has persisted overnight. -continue NS at 100 cc/hr   # AKI - patient's creatinine has increased today, consider prerenal in etiology given poor PO intake  Lab 04/09/12 0314 04/08/12 1145  CREATININE 1.05 0.Kelsey  -FeNa to evaluate if prerenal -IV fluids at 100 cc/hr -regular diet, patient tolerating PO better today  # HIV - CD4 = 20, VL = 228K. Currently on truvada and isentress.  -continue HAART   # Normal leukocyte count - during last hospitalization, the patient was noted to have WBC counts in the 2's-5's. At clinic follow-up on 4/18, WBC = 1.7, indicating neutropenia.  However, WBC currently 4.3 -> 4.8, with ANC of 2.6, indicating no present neutropenia. -monitor daily cbc's, with diff at least every other day  # Tuberculosis - currently on 4-drug therapy with ethambutol, isoniazid, pyrazinamide, and rifabutin, with pyridoxine for prevention of peripheral neuropathy with isoniazid.  -continue TB treatment medications and pyridoxine  -monitor LFT's  -per ID, will obtain induced sputum with AFB culture/smear, to demonstrate clearance of TB  # Prophy - lovenox, dapsone, azithromycin   Signed Janalyn Harder, PGY1 04/09/2012 1:48 PM

## 2012-04-09 NOTE — Progress Notes (Signed)
04-09-12 UR review completed.  Jasmond River RN BSN 

## 2012-04-09 NOTE — Consult Note (Signed)
Date of Admission:  04/08/2012  Date of Consult:  04/09/2012  Reason for Consult: fevers, chest pain, nausea and vomiting in pt with HIV, TB hx of HSV esophagitis Referring Physician: Dr. Rogelia Boga   HPI: Kelsey Rodriguez is an 21 y.o. female with recently diagnosed extrapulmonary tuberculosis, herpes simplex esophagitis and HIV 8. She  into nearly her fourth week of therapy for extrapulmonary and pulmonary tuberculosis with isoniazid rifampin ethambutol pyrazinamide. She is less than 10 days into therapy with Isentress and Truvada for HIV. She was seen by Dr. Ilsa Iha on the 25th with complaints of difficulty with chest pain precipitated by eating both solids and liquids. Her labs on the 18th had disclosed new onset neutropenia that was thought to be either due to her trimethoprim sulfamethoxazole or her valacyclovir which were stopped. She is admitted to the teaching service and started on intravenous acyclovir due to concerns for possible HSV esophagitis. Currently the patient has improvement of her chest pain but still has significant nausea and vomiting. She is no longer on for consult I wonder if she might have recurrence of candidal infection of her GI tract and whether this might be contributing to her dysphasia. She is still having fevers. Her neutropenia has improved with removal of her trimethoprim sulfamethoxazole.  Past Medical History  Diagnosis Date  . Tuberculosis   . HIV (human immunodeficiency virus infection) 02/2012    Past Surgical History  Procedure Date  . Esophagogastroduodenoscopy 03/11/2012    Procedure: ESOPHAGOGASTRODUODENOSCOPY (EGD);  Surgeon: Hart Carwin, MD;  Location: [facility] ENDOSCOPY;  Service: Endoscopy;  Laterality: N/A;  . Appendectomy ~ 2000  . Lung biopsy 02/2012  ergies:   No Known Allergies   Medications: I have reviewed patients current medications as documented in Epic Anti-infectives     Start     Dose/Rate Route Frequency Ordered Stop   04/09/12 1000    emtricitabine-tenofovir (TRUVADA) 200-300 MG per tablet 1 tablet        1 tablet Oral Daily 04/08/12 2120     04/09/12 1000   azithromycin (ZITHROMAX) tablet 1,200 mg        1,200 mg Oral Weekly 04/08/12 2120     04/09/12 0800   vancomycin (VANCOCIN) 750 mg in sodium chloride 0.9 % 150 mL IVPB  Status:  Discontinued        750 mg 150 mL/hr over 60 Minutes Intravenous Every 12 hours 04/09/12 0700 04/09/12 0808   04/09/12 0745   piperacillin-tazobactam (ZOSYN) IVPB 3.375 g  Status:  Discontinued        3.375 g 12.5 mL/hr over 240 Minutes Intravenous 4 times per day 04/09/12 0700 04/09/12 0808   04/08/12 2300   acyclovir (ZOVIRAX) 285 mg in dextrose 5 % 100 mL IVPB        5 mg/kg  56.6 kg 105.7 mL/hr over 60 Minutes Intravenous Every 8 hours 04/08/12 2146     04/08/12 2200   ethambutol (MYAMBUTOL) tablet 800 mg        800 mg Oral Daily 04/08/12 2120     04/08/12 2200   pyrazinamide tablet 1,000 mg        1,000 mg Oral Daily 04/08/12 2120     04/08/12 2200   raltegravir (ISENTRESS) tablet 400 mg        400 mg Oral 2 times daily 04/08/12 2120     04/08/12 2200   isoniazid (NYDRAZID) tablet 300 mg        300 mg Oral Daily 04/08/12 2120  04/08/12 2200   rifabutin (MYCOBUTIN) capsule 300 mg        300 mg Oral Daily 04/08/12 2120     04/08/12 2200   dapsone tablet 100 mg        100 mg Oral Daily 04/08/12 2120            Social History:  reports that she has never smoked. She has never used smokeless tobacco. She reports that she does not drink alcohol or use illicit drugs.  History reviewed. No pertinent family history.  As in HPI and primary teams notes otherwise 12 point review of systems is negative  Blood pressure 107/74, pulse 107, temperature 99.4 F (37.4 C), temperature source Oral, resp. rate 14, height 5\' 5"  (1.651 m), weight 124 lb 12.5 oz (56.6 kg), last menstrual period 03/29/2012, SpO2 98.00%. General: Alert and awake, oriented x3, not in any acute  distress. HEENT: anicteric sclera, pupils reactive to light and accommodation, EOMI, oropharynx clear and without exudate CVS regular rate, normal r,  no murmur rubs or gallops Chest: clear to auscultation bilaterally, no wheezing, rales or rhonchi Abdomen: soft nontender, nondistended, normal bowel sounds, Extremities: no  clubbing or edema noted bilaterally Skin: no rashes Neuro: nonfocal, strength and sensation intact   Results for orders placed during the hospital encounter of 04/08/12 (from the past 48 hour(s))  CARDIAC PANEL(CRET KIN+CKTOT+MB+TROPI)     Status: Normal   Collection Time   04/08/12  9:23 PM      Component Value Range Comment   Total CK 55  7 - 177 (U/L)    CK, MB 0.9  0.3 - 4.0 (ng/mL)    Troponin I <0.30  <0.30 (ng/mL)    Relative Index RELATIVE INDEX IS INVALID  0.0 - 2.5    CARDIAC PANEL(CRET KIN+CKTOT+MB+TROPI)     Status: Normal   Collection Time   04/09/12  3:13 AM      Component Value Range Comment   Total CK 66  7 - 177 (U/L)    CK, MB 1.0  0.3 - 4.0 (ng/mL)    Troponin I <0.30  <0.30 (ng/mL)    Relative Index RELATIVE INDEX IS INVALID  0.0 - 2.5    COMPREHENSIVE METABOLIC PANEL     Status: Abnormal   Collection Time   04/09/12  3:14 AM      Component Value Range Comment   Sodium 138  135 - 145 (mEq/L)    Potassium 4.4  3.5 - 5.1 (mEq/L)    Chloride 102  96 - 112 (mEq/L)    CO2 23  19 - 32 (mEq/L)    Glucose, Bld 94  70 - 99 (mg/dL)    BUN 12  6 - 23 (mg/dL)    Creatinine, Ser 6.04  0.50 - 1.10 (mg/dL) DELTA CHECK NOTED   Calcium 9.5  8.4 - 10.5 (mg/dL)    Total Protein 8.6 (*) 6.0 - 8.3 (g/dL)    Albumin 3.4 (*) 3.5 - 5.2 (g/dL)    AST 31  0 - 37 (U/L) HEMOLYSIS AT THIS LEVEL MAY AFFECT RESULT   ALT 19  0 - 35 (U/L)    Alkaline Phosphatase 93  39 - 117 (U/L)    Total Bilirubin 0.4  0.3 - 1.2 (mg/dL)    GFR calc non Af Amer 74 (*) >90 (mL/min)    GFR calc Af Amer 86 (*) >90 (mL/min)   CBC     Status: Abnormal   Collection Time   04/09/12  3:21  AM      Component Value Range Comment   WBC 4.8  4.0 - 10.5 (K/uL)    RBC 3.71 (*) 3.87 - 5.11 (MIL/uL)    Hemoglobin 11.1 (*) 12.0 - 15.0 (g/dL)    HCT 16.1 (*) 09.6 - 46.0 (%)    MCV 88.7  78.0 - 100.0 (fL)    MCH 29.9  26.0 - 34.0 (pg)    MCHC 33.7  30.0 - 36.0 (g/dL)    RDW 04.5 (*) 40.9 - 15.5 (%)    Platelets 174  150 - 400 (K/uL)   CARDIAC PANEL(CRET KIN+CKTOT+MB+TROPI)     Status: Normal   Collection Time   04/09/12  9:29 AM      Component Value Range Comment   Total CK 67  7 - 177 (U/L)    CK, MB 1.0  0.3 - 4.0 (ng/mL)    Troponin I <0.30  <0.30 (ng/mL)    Relative Index RELATIVE INDEX IS INVALID  0.0 - 2.5        Component Value Date/Time   SDES URINE, CLEAN CATCH 03/19/2012 1547   SPECREQUEST Immunocompromised 03/19/2012 1547   CULT NO GROWTH 03/19/2012 1547   REPTSTATUS 03/21/2012 FINAL 03/19/2012 1547   Dg Chest 2 View  04/08/2012  *RADIOLOGY REPORT*  Clinical Data: Chest pain and shortness of breath.  CHEST - 2 VIEW  Comparison: 03/21/2012.  Findings: The cardiac silhouette, mediastinal and hilar contours are stable. The left lung is much better aerated.  There is a persistent soft tissue mass/infiltrate overlying the region of the aortic knob. It has decreased in size.  No pleural effusions or pulmonary edema.  The bony thorax is intact.  The PICC line has been removed.  IMPRESSION: Much improved left lung aeration since the prior chest x-ray. There is a persistent density overlying the aortic knob but it is smaller.  Original Report Authenticated By: P. Loralie Champagne, M.D.     Recent Results (from the past 720 hour(s))  HERPES SIMPLEX VIRUS CULTURE     Status: Normal   Collection Time   03/11/12  3:56 PM      Component Value Range Status Comment   Specimen Description ESOPHAGUS   Final    Special Requests NONE   Final    Culture Herpes Simplex Type 1 detected.   Final    Report Status 03/17/2012 FINAL   Final   CULTURE, BLOOD (ROUTINE X 2)     Status: Normal    Collection Time   03/12/12  6:10 AM      Component Value Range Status Comment   Specimen Description BLOOD RIGHT ARM   Final    Special Requests BOTTLES DRAWN AEROBIC AND ANAEROBIC [facility]   Final    Culture  Setup Time [id number]   Final    Culture NO GROWTH 5 DAYS   Final    Report Status 03/18/2012 FINAL   Final   CULTURE, BLOOD (ROUTINE X 2)     Status: Normal   Collection Time   03/12/12  6:20 AM      Component Value Range Status Comment   Specimen Description BLOOD RIGHT WRIST   Final    Special Requests BOTTLES DRAWN AEROBIC AND ANAEROBIC [facility]   Final    Culture  Setup Time [id number]   Final    Culture NO GROWTH 5 DAYS   Final    Report Status 03/18/2012 FINAL   Final   SURGICAL PCR SCREEN  Status: Normal   Collection Time   03/12/12  1:06 PM      Component Value Range Status Comment   MRSA, PCR NEGATIVE  NEGATIVE  Final    Staphylococcus aureus NEGATIVE  NEGATIVE  Final   AFB CULTURE, BLOOD     Status: Normal (Preliminary result)   Collection Time   03/14/12  4:47 PM      Component Value Range Status Comment   Specimen Description BLOOD LEFT ARM   Final    Special Requests BLACK 5CC   Final    Culture     Final    Value: CULTURE WILL BE EXAMINED FOR 6 WEEKS BEFORE ISSUING A FINAL REPORT   Report Status PENDING   Incomplete   SURGICAL PCR SCREEN     Status: Normal   Collection Time   03/16/12 12:20 PM      Component Value Range Status Comment   MRSA, PCR NEGATIVE  NEGATIVE  Final    Staphylococcus aureus NEGATIVE  NEGATIVE  Final   AFB CULTURE WITH SMEAR     Status: Normal (Preliminary result)   Collection Time   03/16/12  5:24 PM      Component Value Range Status Comment   Specimen Description TISSUE   Final    Special Requests     Final    Value: PT ON ZINACEF,AZITHROMYCIN,CYTOVENE,SEPTRA MEDIASTINAL MASS   ACID FAST SMEAR     Final    Value: 4+ ACID FAST BACILLI SEEN CRITICAL RESULT CALLED TO, READ BACK BY AND VERIFIED WITH: DR Jerolyn Center 14:15  03/17/12 GF   Culture     Final    Value: MYCOBACTERIUM TUBERCULOSIS COMPLEX     17 Note: CRITICAL RESULT CALLED TO, READ BACK BY AND VERIFIED WITH: DR Ilsa Iha @ 4:55PM ON 4 13 BY NW CRITICAL RESULT CALLED TO, READ BACK BY AND VERIFIED WITH: HD WANDA TABON @ 5:02PM ON 4 17 13  BY NW FAXED TO [phone]   Report Status PENDING   Incomplete   FUNGUS CULTURE W SMEAR     Status: Normal (Preliminary result)   Collection Time   03/16/12  5:24 PM      Component Value Range Status Comment   Specimen Description TISSUE   Final    Special Requests     Final    Value: PT ON ZINACEF,AZITHROMYCIN,CYTOVENE,SEPTRA MEDIASTINAL MASS   Fungal Smear NO YEAST OR FUNGAL ELEMENTS SEEN   Final    Culture CULTURE IN PROGRESS FOR FOUR WEEKS   Final    Report Status PENDING   Incomplete   CULTURE, BLOOD (ROUTINE X 2)     Status: Normal   Collection Time   03/19/12 12:35 PM      Component Value Range Status Comment   Specimen Description BLOOD LEFT ARM   Final    Special Requests BOTTLES DRAWN AEROBIC AND ANAEROBIC 10CC   Final    Culture  Setup Time [id number]   Final    Culture NO GROWTH 5 DAYS   Final    Report Status 03/25/2012 FINAL   Final   CULTURE, BLOOD (ROUTINE X 2)     Status: Normal   Collection Time   03/19/12 12:50 PM      Component Value Range Status Comment   Specimen Description BLOOD LEFT HAND   Final    Special Requests BOTTLES DRAWN AEROBIC AND ANAEROBIC 10CC   Final    Culture  Setup Time [id number]   Final  Culture NO GROWTH 5 DAYS   Final    Report Status 03/25/2012 FINAL   Final   AFB CULTURE WITH SMEAR     Status: Normal (Preliminary result)   Collection Time   03/19/12  3:46 PM      Component Value Range Status Comment   Specimen Description SPUTUM   Final    Special Requests Immunocompromised   Final    ACID FAST SMEAR     Final    Value: SUSPICIOUS AFB SMEAR (1-2 ORGANISMS SEEN ON THE ENTIRE SLIDE;PLEASE SUBMIT ADDITIONAL SPECIMENS).   Culture     Final    Value: CULTURE WILL BE  EXAMINED FOR 6 WEEKS BEFORE ISSUING A FINAL REPORT   Report Status PENDING   Incomplete   URINE CULTURE     Status: Normal   Collection Time   03/19/12  3:47 PM      Component Value Range Status Comment   Specimen Description URINE, CLEAN CATCH   Final    Special Requests Immunocompromised   Final    Culture  Setup Time [id number]   Final    Colony Count NO GROWTH   Final    Culture NO GROWTH   Final    Report Status 03/21/2012 FINAL   Final      Impression/Recommendation 21 year old African lady with pulmonary infection pulmonary tuberculosis also with HIV and AIDS admitted with fevers chest pain and recent neutropenia.  #1 chest pain and dysphagia now with nausea and vomiting: He is being treated for possible HSV esophagitis. I would consider starting fluconazole for a period treatment of candida. If her symptoms fail to improve she is going to need a repeat EGD versus broadening of her acyclovir to ganciclovir. Would prefer that we have a formal diagnosis however since not want to have to commit her to protracted ganciclovir in bowel ganciclovir without clear-cut need  #2 neutropenia seems to have resolved with discontinuation of her trimethoprim sulfamethoxazole.  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 She'll need at this point in time for vancomycin or Zosyn although these therapies were appropriate in the context of febrile neutropenia. As she is at risk for other opportunistic infections and a mass given and of the knee and reconstitution syndrome to such infections are have to keep this in mind as well.   #3 tuberculosis continue current therapy  #4 HIV AIDS continue isentress and truvada  Thank you so much for this interesting consult,   Acey Lav 04/09/2012, 5:36 PM   [phone] (pager) [phone] (office)

## 2012-04-10 LAB — BASIC METABOLIC PANEL
BUN: 6 mg/dL (ref 6–23)
CO2: 23 mEq/L (ref 19–32)
Calcium: 9.2 mg/dL (ref 8.4–10.5)
Chloride: 106 mEq/L (ref 96–112)
Creatinine, Ser: 1.12 mg/dL — ABNORMAL HIGH (ref 0.50–1.10)
GFR calc Af Amer: 80 mL/min — ABNORMAL LOW (ref >90)
GFR calc non Af Amer: 69 mL/min — ABNORMAL LOW (ref >90)
Glucose, Bld: 88 mg/dL (ref 70–99)
Potassium: 4 mEq/L (ref 3.5–5.1)
Sodium: 139 mEq/L (ref 135–145)

## 2012-04-10 LAB — SODIUM, URINE, RANDOM: Sodium, Ur: 44 mEq/L

## 2012-04-10 LAB — CREATININE, URINE, RANDOM: Creatinine, Urine: 61.79 mg/dL

## 2012-04-10 MED ORDER — ONDANSETRON HCL 4 MG PO TABS
8.0000 mg | ORAL_TABLET | Freq: Three times a day (TID) | ORAL | Status: DC | PRN
  Administered 2012-04-11 – 2012-04-12 (×2): 8 mg via ORAL
  Filled 2012-04-10 (×2): qty 2

## 2012-04-10 MED ORDER — FLUCONAZOLE 100 MG PO TABS
100.0000 mg | ORAL_TABLET | Freq: Every day | ORAL | Status: DC
  Administered 2012-04-11 – 2012-04-12 (×2): 100 mg via ORAL
  Filled 2012-04-10 (×2): qty 1

## 2012-04-10 MED ORDER — BISMUTH SUBSALICYLATE 262 MG/15ML PO SUSP
30.0000 mL | Freq: Two times a day (BID) | ORAL | Status: DC | PRN
  Administered 2012-04-10: 30 mL via ORAL
  Filled 2012-04-10: qty 236

## 2012-04-10 MED ORDER — FLUCONAZOLE 200 MG PO TABS
200.0000 mg | ORAL_TABLET | Freq: Once | ORAL | Status: AC
  Administered 2012-04-10: 200 mg via ORAL
  Filled 2012-04-10: qty 1

## 2012-04-10 MED ORDER — VALACYCLOVIR HCL 500 MG PO TABS
1000.0000 mg | ORAL_TABLET | Freq: Three times a day (TID) | ORAL | Status: DC
  Administered 2012-04-10 – 2012-04-11 (×4): 1000 mg via ORAL
  Filled 2012-04-10 (×6): qty 2

## 2012-04-10 MED ORDER — SODIUM CHLORIDE 0.9 % IV BOLUS (SEPSIS)
1000.0000 mL | Freq: Once | INTRAVENOUS | Status: AC
  Administered 2012-04-10: 1000 mL via INTRAVENOUS

## 2012-04-10 NOTE — Progress Notes (Signed)
Subjective: Doing better today.  States that the epigastric/central chest pain is gone.  She does continue to have pleuritic chest pain with deep breathing.  She states that when she takes all of her medications that she has problems with nausea and has actually vomited.  She feels like it is when she takes them all at once.  The Zofran did help.  She is eating better today and has no problems swallowing.   Objective: Vital signs in last 24 hours: Filed Vitals:   04/09/12 0500 04/09/12 0650 04/09/12 2243 04/10/12 0633  BP: 107/74  122/76 115/74  Pulse: 107  112 122  Temp: 101.4 F (38.6 C) 99.4 F (37.4 C) 98.5 F (36.9 C) 99 F (37.2 C)  TempSrc: Oral  Oral Oral  Resp: 14  16 18   Height:      Weight:      SpO2: 98%  97% 98%   Weight change:   Intake/Output Summary (Last 24 hours) at 04/10/12 0941 Last data filed at 04/09/12 2100  Gross per 24 hour  Intake 1870.7 ml  Output      0 ml  Net 1870.7 ml   Physical Exam: Vitals reviewed. General: resting in bed, NAD HEENT: PERRL, EOMI, no scleral icterus Cardiac: RRR, no rubs, murmurs or gallops Pulm: pain to palpation of the left sternal border, clear to auscultation bilaterally, no wheezes, rales, or rhonchi Abd: soft, mild epigastric tenderness to palpation, nondistended, BS present Ext: warm and well perfused, no pedal edema Neuro: alert and oriented X3, cranial nerves II-XII grossly intact, strength and sensation to light touch equal in bilateral upper and lower extremities  Lab Results: Basic Metabolic Panel:  Lab 04/10/12 2956 04/09/12 0314  NA 139 138  K 4.0 4.4  CL 106 102  CO2 23 23  GLUCOSE 88 94  BUN 6 12  CREATININE 1.12* 1.05  CALCIUM 9.2 9.5  MG -- --  PHOS -- --   Liver Function Tests:  Lab 04/09/12 0314 04/08/12 1145  AST 31 20  ALT 19 18  ALKPHOS 93 92  BILITOT 0.4 0.3  PROT 8.6* 8.1  ALBUMIN 3.4* 3.3*   CBC:  Lab 04/09/12 0321 04/08/12 1145  WBC 4.8 4.3  NEUTROABS -- 2.6  HGB 11.1*  10.2*  HCT 32.9* 30.6*  MCV 88.7 87.9  PLT 174 163   Cardiac Enzymes:  Lab 04/09/12 0929 04/09/12 0313 04/08/12 2123  CKTOTAL 67 66 55  CKMB 1.0 1.0 0.9  CKMBINDEX -- -- --  TROPONINI <0.30 <0.30 <0.30   Urinalysis: Urine Sodium: 44 Urine Cr: 61.79 FENa: 0.54%  Studies/Results: Dg Chest 2 View  04/08/2012  *RADIOLOGY REPORT*  Clinical Data: Chest pain and shortness of breath.  CHEST - 2 VIEW  Comparison: 03/21/2012.  Findings: The cardiac silhouette, mediastinal and hilar contours are stable. The left lung is much better aerated.  There is a persistent soft tissue mass/infiltrate overlying the region of the aortic knob. It has decreased in size.  No pleural effusions or pulmonary edema.  The bony thorax is intact.  The PICC line has been removed.  IMPRESSION: Much improved left lung aeration since the prior chest x-ray. There is a persistent density overlying the aortic knob but it is smaller.  Original Report Authenticated By: P. Loralie Champagne, M.D.   Medications: I have reviewed the patient's current medications. Scheduled Meds:   . azithromycin  1,200 mg Oral Weekly  . bismuth subsalicylate  30 mL Oral BID  . dapsone  100  mg Oral Daily  . emtricitabine-tenofovir  1 tablet Oral Daily  . enoxaparin  40 mg Subcutaneous Q24H  . ethambutol  800 mg Oral Daily  . feeding supplement  237 mL Oral BID BM  . fluconazole  200 mg Oral Once   Followed by  . fluconazole  100 mg Oral Daily  . isoniazid  300 mg Oral Daily  . mulitivitamin with minerals  1 tablet Oral Daily  . pantoprazole  40 mg Oral Q1200  . pneumococcal 23 valent vaccine  0.5 mL Intramuscular Tomorrow-1000  . pyrazinamide  1,000 mg Oral Daily  . pyridOXINE  25 mg Oral Daily  . raltegravir  400 mg Oral BID  . rifabutin  300 mg Oral Daily  . sodium chloride  1,000 mL Intravenous Once  . valACYclovir  1,000 mg Oral TID  . DISCONTD: acyclovir  5 mg/kg Intravenous Q8H   Continuous Infusions:   . sodium chloride 100  mL/hr at 04/10/12 0412   PRN Meds:.acetaminophen, acetaminophen, alum & mag hydroxide-simeth, morphine, ondansetron, DISCONTD: ondansetron (ZOFRAN) IV, DISCONTD: ondansetron  Assessment/Plan: 1.  Dysphagia/epigastric pain: Diagnosed by HSV culture of esophageal tissue biopsy, treated with 21-day course of valacyclovir (4/3-4/25) but patient still experiencing symptoms persistent HSV esophagitis. Other potential etiologies include GERD vs candidal esophagitis vs medication side effect. Symptoms much improved this morning from admission.  States she continues to have problems with nausea when taking all of her pills.  We will work to premedicate with Zofran before giving her medications today.   - change acyclovir to Valtrex PO per discussion with Dr. Daiva Eves.   - morphine prn for pain, has not taken any since hospitalization.  - start protonix daily with pepto-bismol BID PRN - maalox prn  - zofran PO prn  - If tolerates PO medication with no recurrence of the epigastric tenderness likely D/C tomorrow after observed TB therapy.  2. Fever - the patient presents with temperature of 100.3, which overnight the day after admission increased to 101.4. No afebrile for ~24 hours.  Fever likely represents known infections (TB vs HSV esophagitis) vs IRIS.  - blood cultures drawn but no results yet.   - patient given 1 dose of vanc/zosyn this morning, but this was discontinued per ID recs  - continue HAART, despite concern for IRIS. Will avoid starting steroids for now per discussion with Dr. Daiva Eves - continue dapsone and azithromycin for prophy   3. Tachycardia - may represent mild volume depletion in the setting of poor PO intake vs febrile response (see above). Tachycardia has persisted overnight. - NS bolus of 1 L then continue NS at 150 cc/hr - Encourage PO intake.  4.  AKI - patient's creatinine has increased mildly again today.  FENa was 0.54% which indicates prerenal.  She states that she is  eating better and she continues to be tachycardic.  We will bolus with 1L NS and continue IV fluids.  - 1L NS bolus - IV fluids at 150 cc/hr  - regular diet and encourage PO intake.   5.  HIV - CD4 = 20, VL = 228K. Currently on truvada and isentress.  -continue HAART   6.  Normal leukocyte count - during last hospitalization, the patient was noted to have WBC counts in the 2's-5's. At clinic follow-up on 4/18, WBC = 1.7, indicating neutropenia. However, WBC currently 4.3 -> 4.8, with ANC of 2.6, indicating no present neutropenia.  -monitor daily cbc's, with diff at least every other day  7.  Tuberculosis - currently on 4-drug therapy with ethambutol, isoniazid, pyrazinamide, and rifabutin, with pyridoxine for prevention of peripheral neuropathy with isoniazid.  -continue TB treatment medications and pyridoxine  -monitor LFT's  -per ID, will obtain induced sputum with AFB culture/smear, to demonstrate clearance of TB   8.  Prophy - lovenox, dapsone, azithromycin   LOS: 2 days   Reann Dobias 04/10/2012, 9:41 AM

## 2012-04-10 NOTE — Progress Notes (Signed)
Patient Kelsey Rodriguez, 21 year old African female is a native of [place] in [place], now residing in [place], [place].  She struggles with several health issues, but finds strength in her relationship with God.  Patient expressed appreciation for Chaplain's provision of pastoral presence, prayer, and conversation.  I will follow-up as needed.

## 2012-04-11 LAB — BASIC METABOLIC PANEL
BUN: 4 mg/dL — ABNORMAL LOW (ref 6–23)
CO2: 22 mEq/L (ref 19–32)
Calcium: 8.7 mg/dL (ref 8.4–10.5)
Chloride: 107 mEq/L (ref 96–112)
Creatinine, Ser: 0.88 mg/dL (ref 0.50–1.10)
GFR calc Af Amer: >90 mL/min (ref >90)
GFR calc non Af Amer: >90 mL/min (ref >90)
Glucose, Bld: 85 mg/dL (ref 70–99)
Potassium: 3.7 mEq/L (ref 3.5–5.1)
Sodium: 138 mEq/L (ref 135–145)

## 2012-04-11 LAB — CBC
HCT: 26.4 % — ABNORMAL LOW (ref 36.0–46.0)
Hemoglobin: 8.7 g/dL — ABNORMAL LOW (ref 12.0–15.0)
MCH: 28.7 pg (ref 26.0–34.0)
MCHC: 33 g/dL (ref 30.0–36.0)
MCV: 87.1 fL (ref 78.0–100.0)
Platelets: 192 10*3/uL (ref 150–400)
RBC: 3.03 MIL/uL — ABNORMAL LOW (ref 3.87–5.11)
RDW: 15.5 % (ref 11.5–15.5)
WBC: 3.4 10*3/uL — ABNORMAL LOW (ref 4.0–10.5)

## 2012-04-11 LAB — DIFFERENTIAL
Basophils Absolute: 0 10*3/uL (ref 0.0–0.1)
Basophils Relative: 1 % (ref 0–1)
Eosinophils Absolute: 0.1 10*3/uL (ref 0.0–0.7)
Eosinophils Relative: 3 % (ref 0–5)
Lymphocytes Relative: 21 % (ref 12–46)
Lymphs Abs: 0.7 10*3/uL (ref 0.7–4.0)
Monocytes Absolute: 0.8 10*3/uL (ref 0.1–1.0)
Monocytes Relative: 24 % — ABNORMAL HIGH (ref 3–12)
Neutro Abs: 1.8 10*3/uL (ref 1.7–7.7)
Neutrophils Relative %: 52 % (ref 43–77)

## 2012-04-11 MED ORDER — VALACYCLOVIR HCL 500 MG PO TABS
500.0000 mg | ORAL_TABLET | Freq: Two times a day (BID) | ORAL | Status: DC
  Administered 2012-04-11 – 2012-04-12 (×2): 500 mg via ORAL
  Filled 2012-04-11 (×3): qty 1

## 2012-04-11 NOTE — Progress Notes (Signed)
Subjective: Had a recurrence of the central chest/epigastric pain yesterday.  She states it doesn't hurt when she swallows but a few minutes after.  She did not take the pepto because she states that it makes her vomit.  She was able to take her medications but she was still "quesy" when she did.  She did not use the Zofran until this morning which did help her nausea.  She notes that she has been peeing a lot since the fluids were increased.  Objective: Vital signs in last 24 hours: Filed Vitals:   04/10/12 0633 04/10/12 1400 04/10/12 2113 04/11/12 0459  BP: 115/74 113/75 177/77 119/82  Pulse: 122 113 72 95  Temp: 99 F (37.2 C) 99.5 F (37.5 C) 99.7 F (37.6 C) 99.3 F (37.4 C)  TempSrc: Oral Oral Oral Oral  Resp: 18 18 18 18   Height:      Weight:      SpO2: 98%  98% 98%   Weight change:   Intake/Output Summary (Last 24 hours) at 04/11/12 1122 Last data filed at 04/10/12 1800  Gross per 24 hour  Intake   1350 ml  Output      0 ml  Net   1350 ml   Physical Exam: Vitals reviewed. General: resting in bed, NAD HEENT: PERRL, EOMI, no scleral icterus Cardiac: RRR, no rubs, murmurs or gallops Pulm: clear to auscultation bilaterally, no wheezes, rales, or rhonchi Abd: soft, nontender, nondistended, BS present Ext: warm and well perfused, no pedal edema Neuro: alert and oriented X3, cranial nerves II-XII grossly intact, strength and sensation to light touch equal in bilateral upper and lower extremities  Lab Results: Basic Metabolic Panel:  Lab 04/11/12 2130 04/10/12 0806  NA 138 139  K 3.7 4.0  CL 107 106  CO2 22 23  GLUCOSE 85 88  BUN 4* 6  CREATININE 0.88 1.12*  CALCIUM 8.7 9.2  MG -- --  PHOS -- --   Liver Function Tests:  Lab 04/09/12 0314 04/08/12 1145  AST 31 20  ALT 19 18  ALKPHOS 93 92  BILITOT 0.4 0.3  PROT 8.6* 8.1  ALBUMIN 3.4* 3.3*   CBC:  Lab 04/11/12 0612 04/09/12 0321 04/08/12 1145  WBC 3.4* 4.8 --  NEUTROABS 1.8 -- 2.6  HGB 8.7* 11.1* --    HCT 26.4* 32.9* --  MCV 87.1 88.7 --  PLT 192 174 --   Cardiac Enzymes:  Lab 04/09/12 0929 04/09/12 0313 04/08/12 2123  CKTOTAL 67 66 55  CKMB 1.0 1.0 0.9  CKMBINDEX -- -- --  TROPONINI <0.30 <0.30 <0.30   Micro Results: Recent Results (from the past 240 hour(s))  CULTURE, BLOOD (ROUTINE X 2)     Status: Normal (Preliminary result)   Collection Time   04/09/12  9:33 AM      Component Value Range Status Comment   Specimen Description BLOOD ARM RIGHT   Final    Special Requests     Final    Value: BOTTLES DRAWN AEROBIC AND ANAEROBIC 10CC AER 8CC ANA   Culture  Setup Time [id number]   Final    Culture     Final    Value:        BLOOD CULTURE RECEIVED NO GROWTH TO DATE CULTURE WILL BE HELD FOR 5 DAYS BEFORE ISSUING A FINAL NEGATIVE REPORT   Report Status PENDING   Incomplete   CULTURE, BLOOD (ROUTINE X 2)     Status: Normal (Preliminary result)   Collection Time  04/09/12 12:45 PM      Component Value Range Status Comment   Specimen Description BLOOD LEFT HAND   Final    Special Requests BOTTLES DRAWN AEROBIC AND ANAEROBIC 5CC   Final    Culture  Setup Time [id number]   Final    Culture     Final    Value:        BLOOD CULTURE RECEIVED NO GROWTH TO DATE CULTURE WILL BE HELD FOR 5 DAYS BEFORE ISSUING A FINAL NEGATIVE REPORT   Report Status PENDING   Incomplete    Medications: I have reviewed the patient's current medications. Scheduled Meds:   . azithromycin  1,200 mg Oral Weekly  . dapsone  100 mg Oral Daily  . emtricitabine-tenofovir  1 tablet Oral Daily  . enoxaparin  40 mg Subcutaneous Q24H  . ethambutol  800 mg Oral Daily  . feeding supplement  237 mL Oral BID BM  . fluconazole  100 mg Oral Daily  . isoniazid  300 mg Oral Daily  . mulitivitamin with minerals  1 tablet Oral Daily  . pantoprazole  40 mg Oral Q1200  . pyrazinamide  1,000 mg Oral Daily  . pyridOXINE  25 mg Oral Daily  . raltegravir  400 mg Oral BID  . rifabutin  300 mg Oral Daily  . valACYclovir   500 mg Oral BID  . DISCONTD: valACYclovir  1,000 mg Oral TID   Continuous Infusions:   . sodium chloride 150 mL/hr at 04/10/12 1626   PRN Meds:.acetaminophen, acetaminophen, alum & mag hydroxide-simeth, bismuth subsalicylate, morphine, ondansetron  Assessment/Plan: 1. Dysphagia/epigastric pain: Diagnosed by HSV culture of esophageal tissue biopsy, treated with 21-day course of valacyclovir (4/3-4/25) but patient still experiencing symptoms persistent HSV esophagitis. Other potential etiologies include GERD vs candidal esophagitis vs medication side effect. Symptoms improved since admission but she still does occasionally have problems after eating.  States she continues to have problems with nausea when taking all of her pills. We will work to premedicate with Zofran before giving her medications today.  - change acyclovir treatment to Valtrex PO suppressive dose per discussion with Dr. Daiva Eves.  - morphine prn for pain, has not taken any since hospitalization.  - start protonix daily with maalox.  She may also need viscous lidocaine if she continues to have problems.  Carafate should be avoided because of the changes in the absorption of her other medication.  - maalox prn  - zofran PO prn  - If tolerates PO medication with no recurrence of the epigastric tenderness likely D/C tomorrow after observed TB therapy.   2. Fever - the patient presents with temperature of 100.3, which overnight the day after admission increased to 101.4. No afebrile for ~48 hours. Fever likely represents known infections (TB vs HSV esophagitis) vs IRIS.  - blood cultures drawn and NGTD - patient given 1 dose of vanc/zosyn this morning, but this was discontinued per ID recs  - continue HAART, despite concern for IRIS. Will avoid starting steroids for now per discussion with Dr. Daiva Eves  - continue dapsone and azithromycin for prophy   3. Tachycardia - may represent mild volume depletion in the setting of poor PO  intake vs febrile response (see above). Tachycardia resolved after increasing fluids and bolus.   - continue NS @ 150 for now. - Encourage PO intake.   4. AKI - patient's creatinine normalized today after IV fluids.  She had increased from baseline and her FENa was 0.54% which indicated  prerenal.  - IV fluids at 150 cc/hr for today - regular diet and encourage PO intake.   5. HIV - CD4 = 20, VL = 228K. Currently on truvada and isentress.  -continue HAART   6. Normal leukocyte count - during last hospitalization, the patient was noted to have WBC counts in the 2's-5's. At clinic follow-up on 4/18, WBC = 1.7, indicating neutropenia. However, WBC currently 4.3 -> 4.8, with ANC of 2.6, it did decrease yesterday with fluid administration but will monitor before working anything else up.  -monitor daily cbc's with diff tomorrow  7. Anemia:  Her Hbg on admission was 10.2, rose to 11.1 and then with fluids yesterday dropped to 8.7 today.  On admission in march she did drop to 8.6 but rebounded after discharge.  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  She has no signs of bleeding at all.  We will monitor it tomorrow and if it stabilizes we will continue to follow as outpatient.   8. Tuberculosis - currently on 4-drug therapy with ethambutol, isoniazid, pyrazinamide, and rifabutin, with pyridoxine for prevention of peripheral neuropathy with isoniazid.  -continue TB treatment medications and pyridoxine  -monitor LFT's  -per ID, will obtain induced sputum with AFB culture/smear, to demonstrate clearance of TB   9. Prophy - lovenox, dapsone, azithromycin   LOS: 3 days   Catarino Vold Internal Medicine Resident Pager: [phone] 04/11/2012 11:22 AM

## 2012-04-12 LAB — CBC
HCT: 26.8 % — ABNORMAL LOW (ref 36.0–46.0)
Hemoglobin: 8.9 g/dL — ABNORMAL LOW (ref 12.0–15.0)
MCH: 28.8 pg (ref 26.0–34.0)
MCHC: 33.2 g/dL (ref 30.0–36.0)
MCV: 86.7 fL (ref 78.0–100.0)
Platelets: 182 10*3/uL (ref 150–400)
RBC: 3.09 MIL/uL — ABNORMAL LOW (ref 3.87–5.11)
RDW: 15.2 % (ref 11.5–15.5)
WBC: 3.2 10*3/uL — ABNORMAL LOW (ref 4.0–10.5)

## 2012-04-12 LAB — BASIC METABOLIC PANEL
BUN: 3 mg/dL — ABNORMAL LOW (ref 6–23)
CO2: 23 mEq/L (ref 19–32)
Calcium: 8.8 mg/dL (ref 8.4–10.5)
Chloride: 107 mEq/L (ref 96–112)
Creatinine, Ser: 0.86 mg/dL (ref 0.50–1.10)
GFR calc Af Amer: >90 mL/min (ref >90)
GFR calc non Af Amer: >90 mL/min (ref >90)
Glucose, Bld: 82 mg/dL (ref 70–99)
Potassium: 3.3 mEq/L — ABNORMAL LOW (ref 3.5–5.1)
Sodium: 139 mEq/L (ref 135–145)

## 2012-04-12 LAB — DIFFERENTIAL
Basophils Absolute: 0 10*3/uL (ref 0.0–0.1)
Basophils Relative: 0 % (ref 0–1)
Eosinophils Absolute: 0.1 10*3/uL (ref 0.0–0.7)
Eosinophils Relative: 4 % (ref 0–5)
Lymphocytes Relative: 17 % (ref 12–46)
Lymphs Abs: 0.6 10*3/uL — ABNORMAL LOW (ref 0.7–4.0)
Monocytes Absolute: 0.8 10*3/uL (ref 0.1–1.0)
Monocytes Relative: 26 % — ABNORMAL HIGH (ref 3–12)
Neutro Abs: 1.7 10*3/uL (ref 1.7–7.7)
Neutrophils Relative %: 53 % (ref 43–77)

## 2012-04-12 MED ORDER — FLUCONAZOLE 100 MG PO TABS: 100.0000 mg | ORAL_TABLET | Freq: Every day | ORAL | Status: AC

## 2012-04-12 MED ORDER — VALACYCLOVIR HCL 500 MG PO TABS: 500.0000 mg | ORAL_TABLET | Freq: Two times a day (BID) | ORAL | Status: DC

## 2012-04-12 MED ORDER — DAPSONE 100 MG PO TABS: 100.0000 mg | ORAL_TABLET | Freq: Every day | ORAL | Status: AC

## 2012-04-12 MED ORDER — ALUM & MAG HYDROXIDE-SIMETH 200-200-20 MG/5ML PO SUSP: 30.0000 mL | Freq: Four times a day (QID) | ORAL | Status: AC | PRN

## 2012-04-12 MED ORDER — PANTOPRAZOLE SODIUM 40 MG PO TBEC: 40.0000 mg | DELAYED_RELEASE_TABLET | Freq: Every day | ORAL | Status: DC

## 2012-04-12 MED ORDER — ONDANSETRON 8 MG PO TBDP: 8.0000 mg | ORAL_TABLET | Freq: Three times a day (TID) | ORAL | Status: AC | PRN

## 2012-04-12 MED ORDER — POTASSIUM CHLORIDE 20 MEQ/15ML (10%) PO LIQD
40.0000 meq | Freq: Once | ORAL | Status: AC
  Administered 2012-04-12: 40 meq via ORAL
  Filled 2012-04-12: qty 30

## 2012-04-12 MED ORDER — HYDROCODONE-ACETAMINOPHEN 5-325 MG PO TABS: 1.0000 | ORAL_TABLET | Freq: Four times a day (QID) | ORAL | Status: AC | PRN

## 2012-04-12 MED ORDER — MIRTAZAPINE 15 MG PO TABS: 15.0000 mg | ORAL_TABLET | Freq: Every day | ORAL | Status: DC

## 2012-04-12 NOTE — Progress Notes (Signed)
PGY1 Addendum I agree with excellent MS3 note above.  I have seen and examined patient with MS3. S:  The patient continues to note epigastric pain and esophageal burning, likely secondary to HSV esophagitis, though she notes that her pain is controlled with antacids and pain medications.  She notes decreased appetite, with a large component of nausea contributing to this symptom.  O:  General: alert, cooperative, and in no apparent distress HEENT: pupils equal round and reactive to light, vision grossly intact, oropharynx clear and non-erythematous  Neck: supple, no lymphadenopathy Lungs: clear to ascultation bilaterally, normal work of respiration, no wheezes, rales, ronchi. Chest tender to sternal palpation Heart: tachycardic, regular rhythm, no murmurs, gallops, or rubs Abdomen: soft, moderately tender to epigastric palpation, non-distended, normal bowel sounds  Extremities: no cyanosis, clubbing, or edema Neurologic: alert & oriented X3, cranial nerves II-XII intact, strength grossly intact, sensation intact to light touch  I have reviewed labs and imaging.  A/P: The patient is a 21 yo woman, history of HIV (CD4 = 20), TB, and HSV esophagitis, presenting with epigastric pain and dysphagia, likely representing continued HSV esophagitis.   # Dysphagia/abd pain - diagnosed by HSV culture of esophageal tissue biopsy, treated with 21-day course of valacyclovir (4/3-4/25). However, patient still experiencing symptoms persistent HSV esophagitis. Other potential etiologies include GERD vs candidal esophagitis vs medication side effect. Symptoms somewhat improved this morning.  -continue valtrex prophy -change IV morphine to PO hydrocodone at discharge -protonix daily -maalox prn  -zofran prn  -start remeron for appetite stimulation, and for mood  # Fever - the patient presents with temperature of 100.3, which overnight increased to 101.4. Fever likely represents known infections (TB vs HSV  esophagitis) vs IRIS.  -blood cultures negative to date -continue HAART -continue dapsone and azithromycin for prophy   # Tachycardia - may represent mild volume depletion in the setting of poor PO intake vs febrile response (see above). Tachycardia has improved after aggressive IV fluids. -resolved  # AKI - resolved  # HIV - CD4 = 20, VL = 228K. Currently on truvada and isentress.  -continue HAART   # Anemia - Hb stable around 9, likely representing anemia of chronic disease.  Anemia panel suggests low-normal iron. -follow-up as outpatient.  # Normal leukocyte count - during last hospitalization, the patient was noted to have WBC counts in the 2's-5's. At clinic follow-up on 4/18, WBC = 1.7, indicating neutropenia. However, patient currently has no neutropenia by CBC. -daily cbc  # Tuberculosis - currently on 4-drug therapy with ethambutol, isoniazid, pyrazinamide, and rifabutin, with pyridoxine for prevention of peripheral neuropathy with isoniazid.  -continue TB treatment medications and pyridoxine  -monitor LFT's  -patient unable to give induced sputum sample, will defer to health department  # Prophy - lovenox, dapsone, azithromycin  # Dispo - discharge today, with ID clinic follow-up  Signed Janalyn Harder, PGY1 04/12/2012 12:10 PM

## 2012-04-12 NOTE — Progress Notes (Signed)
Subjective: Pt still with chest pain and had nausea this am with anti TB meds, aRVs   Antibiotics:  Anti-infectives     Start     Dose/Rate Route Frequency Ordered Stop   04/11/12 2200   valACYclovir (VALTREX) tablet 500 mg        500 mg Oral 2 times daily 04/11/12 1121     04/11/12 1000   fluconazole (DIFLUCAN) tablet 100 mg        100 mg Oral Daily 04/10/12 0811     04/10/12 1100   valACYclovir (VALTREX) tablet 1,000 mg  Status:  Discontinued        1,000 mg Oral 3 times daily 04/10/12 0940 04/11/12 1121   04/10/12 0900   fluconazole (DIFLUCAN) tablet 200 mg        200 mg Oral  Once 04/10/12 0811 04/10/12 0836   04/09/12 1000  emtricitabine-tenofovir (TRUVADA) 200-300 MG per tablet 1 tablet       1 tablet Oral Daily 04/08/12 2120     04/09/12 1000   azithromycin (ZITHROMAX) tablet 1,200 mg        1,200 mg Oral Weekly 04/08/12 2120     04/09/12 0800   vancomycin (VANCOCIN) 750 mg in sodium chloride 0.9 % 150 mL IVPB  Status:  Discontinued        750 mg 150 mL/hr over 60 Minutes Intravenous Every 12 hours 04/09/12 0700 04/09/12 0808   04/09/12 0745   piperacillin-tazobactam (ZOSYN) IVPB 3.375 g  Status:  Discontinued        3.375 g 12.5 mL/hr over 240 Minutes Intravenous 4 times per day 04/09/12 0700 04/09/12 0808   04/08/12 2300   acyclovir (ZOVIRAX) 285 mg in dextrose 5 % 100 mL IVPB  Status:  Discontinued        5 mg/kg  56.6 kg 105.7 mL/hr over 60 Minutes Intravenous Every 8 hours 04/08/12 2146 04/10/12 0940   04/08/12 2200   ethambutol (MYAMBUTOL) tablet 800 mg        800 mg Oral Daily 04/08/12 2120     04/08/12 2200   pyrazinamide tablet 1,000 mg        1,000 mg Oral Daily 04/08/12 2120     04/08/12 2200   raltegravir (ISENTRESS) tablet 400 mg        400 mg Oral 2 times daily 04/08/12 2120     04/08/12 2200   isoniazid (NYDRAZID) tablet 300 mg        300 mg Oral Daily 04/08/12 2120     04/08/12 2200   rifabutin (MYCOBUTIN) capsule 300 mg        300 mg Oral  Daily 04/08/12 2120     04/08/12 2200   dapsone tablet 100 mg        100 mg Oral Daily 04/08/12 2120            Medications: Scheduled Meds:   . azithromycin  1,200 mg Oral Weekly  . dapsone  100 mg Oral Daily  . emtricitabine-tenofovir  1 tablet Oral Daily  . enoxaparin  40 mg Subcutaneous Q24H  . ethambutol  800 mg Oral Daily  . feeding supplement  237 mL Oral BID BM  . fluconazole  100 mg Oral Daily  . isoniazid  300 mg Oral Daily  . mulitivitamin with minerals  1 tablet Oral Daily  . pantoprazole  40 mg Oral Q1200  . potassium chloride  40 mEq Oral Once  . pyrazinamide  1,000 mg Oral Daily  .  pyridOXINE  25 mg Oral Daily  . raltegravir  400 mg Oral BID  . rifabutin  300 mg Oral Daily  . valACYclovir  500 mg Oral BID   Continuous Infusions:   . sodium chloride 150 mL/hr at 04/11/12 1902   PRN Meds:.acetaminophen, acetaminophen, alum & mag hydroxide-simeth, bismuth subsalicylate, morphine, ondansetron   Objective: Weight change:   Intake/Output Summary (Last 24 hours) at 04/12/12 1155 Last data filed at 04/11/12 1843  Gross per 24 hour  Intake   1570 ml  Output      0 ml  Net   1570 ml   Blood pressure 118/78, pulse 90, temperature 98.7 F (37.1 C), temperature source Oral, resp. rate 18, height 5\' 5"  (1.651 m), weight 125 lb 3.5 oz (56.8 kg), last menstrual period 03/29/2012, SpO2 98.00%. Temp:  [98.7 F (37.1 C)-99.4 F (37.4 C)] 98.7 F (37.1 C) (04/29 0457) Pulse Rate:  [86-96] 90  (04/29 0457) Resp:  [18-19] 18  (04/29 0457) BP: (118-143)/(68-78) 118/78 mmHg (04/29 0457) SpO2:  [98 %] 98 % (04/29 0457) Weight:  [125 lb 3.5 oz (56.8 kg)] 125 lb 3.5 oz (56.8 kg) (04/28 1519)  Physical Exam: General: Alert and awake, oriented x3, not in any acute distress. HEENT: anicteric sclera, pupils reactive to light and accommodation, EOMI CVS regular rate, normal r,  no murmur rubs or gallops Chest: clear to auscultation bilaterally, no wheezing, rales or  rhonchi Abdomen: soft nontender, nondistended, normal bowel sounds, Extremities: no  clubbing or edema noted bilaterally Skin: no rashes Lymph: no new lymphadenopathy Neuro: nonfocal  Lab Results:  Basename 04/12/12 0540 04/11/12 0612  WBC 3.2* 3.4*  HGB 8.9* 8.7*  HCT 26.8* 26.4*  PLT 182 192    BMET  Basename 04/12/12 0540 04/11/12 0612  NA 139 138  K 3.3* 3.7  CL 107 107  CO2 23 22  GLUCOSE 82 85  BUN 3* 4*  CREATININE 0.86 0.88  CALCIUM 8.8 8.7    Micro Results: Recent Results (from the past 240 hour(s))  CULTURE, BLOOD (ROUTINE X 2)     Status: Normal (Preliminary result)   Collection Time   04/09/12  9:33 AM      Component Value Range Status Comment   Specimen Description BLOOD ARM RIGHT   Final    Special Requests     Final    Value: BOTTLES DRAWN AEROBIC AND ANAEROBIC 10CC AER 8CC ANA   Culture  Setup Time [id number]   Final    Culture     Final    Value:        BLOOD CULTURE RECEIVED NO GROWTH TO DATE CULTURE WILL BE HELD FOR 5 DAYS BEFORE ISSUING A FINAL NEGATIVE REPORT   Report Status PENDING   Incomplete   CULTURE, BLOOD (ROUTINE X 2)     Status: Normal (Preliminary result)   Collection Time   04/09/12 12:45 PM      Component Value Range Status Comment   Specimen Description BLOOD LEFT HAND   Final    Special Requests BOTTLES DRAWN AEROBIC AND ANAEROBIC 5CC   Final    Culture  Setup Time [id number]   Final    Culture     Final    Value:        BLOOD CULTURE RECEIVED NO GROWTH TO DATE CULTURE WILL BE HELD FOR 5 DAYS BEFORE ISSUING A FINAL NEGATIVE REPORT   Report Status PENDING   Incomplete     Studies/Results: No results found.  Assessment/Plan: Kelsey Rodriguez is a 21 y.o. female with pulmonary infection pulmonary tuberculosis also with HIV and AIDS admitted with fevers chest pain and recent neutropenia.   #1 chest pain and dysphagia now with nausea and vomiting: She was treated again emprically for HSV esophagitis with acyclovir, then changed  to valtrex, now to prophylactic valtrex. Still having chest pain. Differential would include IRIS to her TB, recurrent HSV or perhaps CMV infection in esophagus. I understand pt may be going home. If her pain does not improve and she continues to have some degree of dysphagia I would ask GI to repeat EGD and perform repeat biopsies and send specific specimens in viral transport meda for CMV PCR and HSV 1,2 PCR  #2 neutropenia" seemed to have resolved with discontinuation of her trimethoprim sulfamethoxazole though her counts went down a bit with large volume resucitation. 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 She'll need at this point in time for vancomycin or Zosyn although these therapies were appropriate in the context of febrile neutropenia. As she is at risk for other opportunistic infections  given and of the knee and reconstitution syndrome to such infections are have to keep this in mind as well.   #3 tuberculosis continue current therapy  #4 HIV AIDS continue isentress and truvada #5 nausea with meds: she should be PREMEDICATED WITH ZOFRAN plus minus phenergan. This am did not get antededent zofran   LOS: 4 days   Acey Lav 04/12/2012, 11:55 AM

## 2012-04-12 NOTE — Progress Notes (Signed)
Kelsey Rodriguez to be D/C'd Home per MD order.  Discussed prescriptions and follow up appointments with the patient. Prescriptions given to patient, medication list explained in detail. Pt verbalized understanding.   Colleene, [place]  Home Medication Instructions GEX:[id number]   Printed on:04/12/12 1659  Medication Information                    ethambutol (MYAMBUTOL) 100 MG tablet Take 800 mg by mouth daily.           azithromycin (ZITHROMAX) 600 MG tablet Take 1,200 mg by mouth every 7 (seven) days.           emtricitabine-tenofovir (TRUVADA) 200-300 MG per tablet Take 1 tablet by mouth daily.           ENSURE (ENSURE) Take 237 mLs by mouth 2 (two) times daily between meals.           isoniazid (NYDRAZID) 300 MG tablet Take 300 mg by mouth daily.           pyrazinamide 500 MG tablet Take 1,000 mg by mouth daily.           raltegravir (ISENTRESS) 400 MG tablet Take 400 mg by mouth 2 (two) times daily.           rifabutin (MYCOBUTIN) 150 MG capsule Take 300 mg by mouth daily.           pyridOXINE (VITAMIN B-6) 25 MG tablet Take 25 mg by mouth daily.           alum & mag hydroxide-simeth (MAALOX/MYLANTA) 200-200-20 MG/5ML suspension Take 30 mLs by mouth every 6 (six) hours as needed (dyspepsia).           dapsone 100 MG tablet Take 1 tablet (100 mg total) by mouth daily.           fluconazole (DIFLUCAN) 100 MG tablet Take 1 tablet (100 mg total) by mouth daily.           pantoprazole (PROTONIX) 40 MG tablet Take 1 tablet (40 mg total) by mouth daily at 12 noon.           valACYclovir (VALTREX) 500 MG tablet Take 1 tablet (500 mg total) by mouth 2 (two) times daily.           ondansetron (ZOFRAN ODT) 8 MG disintegrating tablet Take 1 tablet (8 mg total) by mouth every 8 (eight) hours as needed for nausea (Take 30 minutes before eating).           HYDROcodone-acetaminophen (NORCO) 5-325 MG per tablet Take 1 tablet by mouth every 6 (six) hours as needed for pain.             mirtazapine (REMERON) 15 MG tablet Take 1 tablet (15 mg total) by mouth at bedtime.             Filed Vitals:   04/12/12 0457  BP: 118/78  Pulse: 90  Temp: 98.7 F (37.1 C)  Resp: 18    Skin clean, dry and intact without evidence of skin break down, no evidence of skin tears noted. IV catheter discontinued intact. Site without signs and symptoms of complications. Dressing and pressure applied. Pt denies pain at this time. No complaints noted.  An After Visit Summary was printed and given to the patient. Patient escorted via WC, and D/C home via private auto.  Driggers, Rae Roam 04/12/2012 4:59 PM

## 2012-04-12 NOTE — Progress Notes (Signed)
Medical Student Daily Progress Note  Subjective: Patient is feeling a little better today as well. She continues to report mild discomfort/pain with swallowing still as well as nausea before her meals. The zofran prior to her meals has helped with this nausea and the morphine has helped with her odynophagia also. Her sleeping has been interrupted with pain but last night receiving morphine before bed allowed her to rest well. She also has had a decreased appetite 2/2 the nausea, although she does express an interest in wanting to eat. She is resting comfortably in bed upon exam. Objective: Vital signs in last 24 hours: Filed Vitals:   04/11/12 1400 04/11/12 1519 04/11/12 2131 04/12/12 0457  BP: 143/68  118/78 118/78  Pulse: 86  96 90  Temp: 99.2 F (37.3 C)  99.4 F (37.4 C) 98.7 F (37.1 C)  TempSrc: Oral  Oral Oral  Resp: 19  18 18   Height:  5\' 5"  (1.651 m)    Weight:  56.8 kg (125 lb 3.5 oz)    SpO2: 98%  98% 98%   Weight change:   Intake/Output Summary (Last 24 hours) at 04/12/12 1105 Last data filed at 04/11/12 1843  Gross per 24 hour  Intake   1570 ml  Output      0 ml  Net   1570 ml   Physical Exam: BP 118/78  Pulse 90  Temp(Src) 98.7 F (37.1 C) (Oral)  Resp 18  Ht 5\' 5"  (1.651 m)  Wt 56.8 kg (125 lb 3.5 oz)  BMI 20.84 kg/m2  SpO2 98%  LMP 03/29/2012 General appearance: alert, cooperative and no distress Head: Normocephalic, without obvious abnormality, atraumatic Eyes: conjunctivae/corneas clear. PERRL, EOM's intact. Fundi benign. Neck: no adenopathy, no carotid bruit, no JVD, supple, symmetrical, trachea midline and thyroid not enlarged, symmetric, no tenderness/mass/nodules Back: symmetric, no curvature. ROM normal. No CVA tenderness. Lungs: clear to auscultation bilaterally Heart: regular rate and rhythm, S1, S2 normal, no murmur, click, rub or gallop Abdomen: abnormal findings:  mild tenderness in the epigastrium Extremities: extremities normal,  atraumatic, no cyanosis or edema Skin: Skin color, texture, turgor normal. No rashes or lesions Lab Results: Basic Metabolic Panel:  Lab 04/12/12 1610 04/11/12 0612  NA 139 138  K 3.3* 3.7  CL 107 107  CO2 23 22  GLUCOSE 82 85  BUN 3* 4*  CREATININE 0.86 0.88  CALCIUM 8.8 8.7  MG -- --  PHOS -- --   Liver Function Tests:  Lab 04/09/12 0314 04/08/12 1145  AST 31 20  ALT 19 18  ALKPHOS 93 92  BILITOT 0.4 0.3  PROT 8.6* 8.1  ALBUMIN 3.4* 3.3*   No results found for this basename: LIPASE:2,AMYLASE:2 in the last 168 hours No results found for this basename: AMMONIA:2 in the last 168 hours CBC:  Lab 04/12/12 0540 04/11/12 0612  WBC 3.2* 3.4*  NEUTROABS 1.7 1.8  HGB 8.9* 8.7*  HCT 26.8* 26.4*  MCV 86.7 87.1  PLT 182 192   Cardiac Enzymes:  Lab 04/09/12 0929 04/09/12 0313 04/08/12 2123  CKTOTAL 67 66 55  CKMB 1.0 1.0 0.9  CKMBINDEX -- -- --  TROPONINI <0.30 <0.30 <0.30   BNP: No results found for this basename: PROBNP:3 in the last 168 hours D-Dimer: No results found for this basename: DDIMER:2 in the last 168 hours CBG: No results found for this basename: GLUCAP:6 in the last 168 hours Hemoglobin A1C: No results found for this basename: HGBA1C in the last 168 hours Fasting Lipid  Panel: No results found for this basename: CHOL,HDL,LDLCALC,TRIG,CHOLHDL,LDLDIRECT in the last 132 hours Thyroid Function Tests: No results found for this basename: TSH,T4TOTAL,FREET4,T3FREE,THYROIDAB in the last 168 hours Coagulation: No results found for this basename: LABPROT:4,INR:4 in the last 168 hours Anemia Panel: No results found for this basename: VITAMINB12,FOLATE,FERRITIN,TIBC,IRON,RETICCTPCT in the last 168 hours Urine Drug Screen: Drugs of Abuse  No results found for this basename: labopia, cocainscrnur, labbenz, amphetmu, thcu, labbarb    Alcohol Level: No results found for this basename: ETH:2 in the last 168 hours Urinalysis: No results found for this basename:  COLORURINE:2,APPERANCEUR:2,LABSPEC:2,PHURINE:2,GLUCOSEU:2,HGBUR:2,BILIRUBINUR:2,KETONESUR:2,PROTEINUR:2,UROBILINOGEN:2,NITRITE:2,LEUKOCYTESUR:2 in the last 168 hours  Micro Results: Recent Results (from the past 240 hour(s))  CULTURE, BLOOD (ROUTINE X 2)     Status: Normal (Preliminary result)   Collection Time   04/09/12  9:33 AM      Component Value Range Status Comment   Specimen Description BLOOD ARM RIGHT   Final    Special Requests     Final    Value: BOTTLES DRAWN AEROBIC AND ANAEROBIC 10CC AER 8CC ANA   Culture  Setup Time [id number]   Final    Culture     Final    Value:        BLOOD CULTURE RECEIVED NO GROWTH TO DATE CULTURE WILL BE HELD FOR 5 DAYS BEFORE ISSUING A FINAL NEGATIVE REPORT   Report Status PENDING   Incomplete   CULTURE, BLOOD (ROUTINE X 2)     Status: Normal (Preliminary result)   Collection Time   04/09/12 12:45 PM      Component Value Range Status Comment   Specimen Description BLOOD LEFT HAND   Final    Special Requests BOTTLES DRAWN AEROBIC AND ANAEROBIC 5CC   Final    Culture  Setup Time [id number]   Final    Culture     Final    Value:        BLOOD CULTURE RECEIVED NO GROWTH TO DATE CULTURE WILL BE HELD FOR 5 DAYS BEFORE ISSUING A FINAL NEGATIVE REPORT   Report Status PENDING   Incomplete    Studies/Results: No results found. Medications: I have reviewed the patient's current medications. Scheduled Meds:   . azithromycin  1,200 mg Oral Weekly  . dapsone  100 mg Oral Daily  . emtricitabine-tenofovir  1 tablet Oral Daily  . enoxaparin  40 mg Subcutaneous Q24H  . ethambutol  800 mg Oral Daily  . feeding supplement  237 mL Oral BID BM  . fluconazole  100 mg Oral Daily  . isoniazid  300 mg Oral Daily  . mulitivitamin with minerals  1 tablet Oral Daily  . pantoprazole  40 mg Oral Q1200  . potassium chloride  40 mEq Oral Once  . pyrazinamide  1,000 mg Oral Daily  . pyridOXINE  25 mg Oral Daily  . raltegravir  400 mg Oral BID  . rifabutin  300 mg  Oral Daily  . valACYclovir  500 mg Oral BID  . DISCONTD: valACYclovir  1,000 mg Oral TID   Continuous Infusions:   . sodium chloride 150 mL/hr at 04/11/12 1902   PRN Meds:.acetaminophen, acetaminophen, alum & mag hydroxide-simeth, bismuth subsalicylate, morphine, ondansetron Assessment/Plan:  Chest discomfort/dysphagia: It has been slowly resolving since admission. It it no longer constant and aching in nature but moreso when she is swallowing all her many pills. 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 -valtrex po prophylactic dose per ID -morphine IV prn (for sleep and pain) -protonix po and maalox -zofran po prn -this regimen has been working well and she will be able to go home today on an equivalent regimen  Fever: afebrile >48 hours now, currently 98.7 this morning. 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 -blood cultures NTD  Tachycardia: This has resolved since her IVF were increased. HR was 90 this am, down from the upper 110's-120's. -NS @ 150cc/hr, will d/c though as she has been volume repleted and imminent discharge today  AKI: Her creatinine had risen to 1.12 but has since come back down to 0.86 2/2 her IVF and bolus received. The zofran also allowed her to tolerate eating better yesterday. -resolved  HIV: HAART therapy started on 04/03/12 and will be continued given that IRIS is very low in suspicion. -continue truvada and isentress  Leukocyte count: It has hovered around the range she experienced during last hospitalization, and has decreased slightly since Sunday (3.4>3.2), however likely 2/2 IVF dilutional effect. Her ANC remains above neutropenic level (1.7 today) and she will need  periodic follow-ups of her CBC. -follow up outpatient  Anemia: Her Hgb rose on admission up to 11.1 and is down today to 8.9, however this is also a dilutional effect, given the amount of IVF she has received over the past 48 hours. Also, given that her baseline seems to be about 10-11 this is not too far off, and will be monitored as well with her follow-up CBC's. -follow up outpatient  TB: She was started on RIPE therapy on 03/18/12, and will continue on this regimen at discharge. Her most recent AFB sputum culture is NTD and will be monitored for 6 weeks. 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 -continue rifabutin, INH, pyrazinamide, ethambutol, and pyridoxine  Prophylaxis: lovenox for DVT, dapsone for PCP/Toxo, azithromycin for MAC  Disposition: Given that she is stable and her pain is under control, she is clear for discharge. She will have follow-up with ID (Dr. Drue Second), if any primary care needs arise she will be able to come to [facility].   LOS: 4 days   This is a Psychologist, occupational Note.  The care of the patient was discussed with Dr. Janalyn Harder and the assessment and plan formulated with their assistance.  Please see their attached note for official documentation of the daily encounter.  Lewie Chamber 04/12/2012, 11:05 AM

## 2012-04-12 NOTE — Discharge Instructions (Signed)
During your hospital stay you were treated for the chest pain/discomfort you were having as well as for a fever and certain low blood counts. Intravenous antibiotics were given as well as morphine and a normal saline IV. Your TB is being treated with the medications you were taking when you came to the hospital as well as your HIV. No changes were made to these medications and it is important that you continue to take your medications as scheduled.  #1) Chest pain/discomfort: During your previous hospitalization the ulcers that were found in your throat (esophagus) have not healed entirely. As these ulcers heal, the pain and discomfort with swallowing will continue to get better. This make take several more weeks. After a sufficient amount of time has passed, it will be recommended that you undergo the test again to have the camera put back in your throat to verify the ulcers have healed successfully.  #2) Fever: Your fever has resolved and your blood cultures have not grown any organisms as of your discharge. If you experience fevers at home above 100.4 please call your doctor.  #3) Low blood count: When you were admitted there were a couple of blood counts that we were observing and treating. These counts will continue to be monitored at your doctor visits over time.  AIDS Treatment, HAART There is no cure for AIDS at this time. Treatments are available that slow the disease for many years and improve the quality of life. Antiviral therapy suppresses the growth of the HIV virus in the body. A combination of several antiretroviral agents has been highly effective in reducing the number of HIV particles in the blood stream. This treatment is called Highly Active Anti-Retroviral Therapy (HAART). Success of this treatment is measured by a blood test called the viral load. This treatment can help improve the immune system and improve T-cell counts. HAART is not a cure for HIV. People on HAART with suppressed  levels of HIV can still give others the HIV virus through sex or sharing of needles. There is good evidence that if the levels of HIV remain suppressed, and the CD4 count (used to assess the immune system of patients) remains high (greater than 200), that life and quality of life can be significantly prolonged and improved. Genetic tests can be used to determine if the virus has become resistant to a particular drug. These tests are useful in deciding the best drug combination and adjusting the drug if it starts to fail. When HIV becomes resistant to HAART, the therapy must be changed to try and suppress the resistant strain of HIV. Different combinations of medications are tried to reduce viral load. This may not be successful, and the patient may develop AIDS. RISK AND COMPLICATIONS HAART is a collection of different medications. They have their own side effects. Some common side effects are:  Feeling sick to your stomach.   Headache.   Weakness.   Fat accumulation on your back and belly (abdomen) called a "buffalo hump,"(lipodystrophy).   Malaise.  When used long-term, these medications may increase the risk of heart disease by affecting fat metabolism. If you are on HAART you will be carefully monitored for possible side effects. In addition, routine blood tests measuring CD4 counts and HIV viral load should be taken every 3 to 4 months. The goal is to:  Get the CD4 count as close to normal as possible.   Suppress the HIV viral load to an undetectable level.  Other antiviral agents are being  looked at. Many new drugs are in the pipeline. Growth factors that stimulate cell growth are sometimes used to treat low red blood cell count (anemia) and low white blood cell counts associated with AIDS. Examples of these are Epogen and G-CSF. Medications are also used to prevent infections such as pneumonia and can keep AIDS patients healthier for longer periods of time. Infections are treated as they  occur.  HIV becomes resistant in patients who do not take their medications every day. Also, certain strains of HIV mutate easily and may become resistant to HAART very quickly. Take all medications as directed. MAKE SURE YOU:   Understand these instructions.   Will watch your condition.   Will get help right away if you are not doing well or get worse.  Document Released: 02/21/2003 Document Revised: 11/20/2011 Document Reviewed: 10/11/2008 [facility] Patient Information 2012 [place], [place].   CD4 Count This is a test used to measure the strength of your immune system if you've been diagnosed with HIV infection. This test measures the number of CD4 cells (also known as T-helper cells) in your blood and assesses the status of your immune system. CD4 cells are a type of white blood cell that fights infection, and they play an important role in your immune system. They help to identify, attack, and destroy specific bacteria, fungi, and other germs that affect the body. CD4 cells are made in the spleen, lymph nodes, and thymus gland, and they circulate throughout the body in the bloodstream. CD4 cells are a major target for HIV, which binds to the surface of CD4 cells, enters them, and either reproduces immediately, killing them in the process, or remains in a resting state, reproducing later. As the HIV virus gets into the cell and replicates, the number of CD4 cells in the blood gradually declines. As HIV disease progresses, the CD4 count will go down and as treatment reduces the progression, the CD4 count will go back up.  The CD4 count tells your caregivers how strong your immune system is, how far HIV disease has advanced (the stage of the disease), and helps predict the risk of complications and debilitating infections. The CD4 count is most useful when it is compared with the count obtained from an earlier test.  The CD4 count is used in combination with the viral load test, which measures the  level of HIV in the blood, to determine the staging and outlook of the disease.  PREPARATION FOR TEST No preparation is required.  NORMAL FINDINGS  T cells  Percent: 60-95   Number of Cells/microL: [phone]  T-helper (CD4) cells  Percent: 60-75   Number of Cells/microL: [phone]  T-suppressor (CD8) cells  Percent: 25-30   Number of Cells/microL: [phone]  B cells  Percent: 4-25   Number of Cells/microL: 100-450  Natural killer cells  Percent: 4-30   Number of Cells/microL:75-500  CD4/CD8 ratio: Greater than 1 Ranges for normal findings may vary among different laboratories and hospitals. You should always check with your doctor after having lab work or other tests done to discuss the meaning of your test results and whether your values are considered within normal limits. MEANING OF TEST  Your caregiver will go over the test results with you and discuss the importance and meaning of your results, as well as treatment options and the need for additional tests if necessary. OBTAINING THE TEST RESULTS It is your responsibility to obtain your test results. Ask the lab or department performing the test when  and how you will get your results. Document Released: 12/23/2004 Document Revised: 11/20/2011 Document Reviewed: 11/08/2008 [facility] Patient Information 2012 [place], [place].

## 2012-04-12 NOTE — Progress Notes (Signed)
Clinical Social Work Department BRIEF PSYCHOSOCIAL ASSESSMENT 04/12/2012  Patient:  Kelsey Rodriguez, Kelsey Rodriguez     Account Number:  [id number]     Admit date:  04/08/2012  Clinical Social Worker:  Conley Simmonds  Date/Time:  04/12/2012 11:00 AM  Referred by:  Physician  Date Referred:  04/12/2012 Referred for  Advanced Directives   Other Referral:   Interview type:  Patient Other interview type:    PSYCHOSOCIAL DATA Living Status:  FAMILY Admitted from facility:   Level of care:   Primary support name:  Gaynelle Cage Primary support relationship to patient:  PARENT Degree of support available:   Strong    CURRENT CONCERNS Current Concerns  None Noted   Other Concerns:    SOCIAL WORK ASSESSMENT / PLAN CSW met with pt at bedside to discuss current concerns and Andvanced Directives. Pt lives with mother and reports service connection with ID clinic and Paramedic.  Pt does not remember requesting Advanced Directives-CSW reviewed packet with pt who will review with mother.  CSW informed pt of variety of options with regards to notarization during and post hospitalization-CSW will follow   Assessment/plan status:  Psychosocial Support/Ongoing Assessment of Needs Other assessment/ plan:   Information/referral to community resources:   None at this time    PATIENT'S/FAMILY'S RESPONSE TO PLAN OF CARE: Pt very pleasant and receptive to CSW assessment-Pt seemed more than happy and comfortable with services she is currently recieveing. Pt hesitant and slightly overwhelmed by Advanced Directives and will discuss with her mother when she feels appropriate-As of right now mother is Proofreader and she relayed if she were to complete POA her mother would Nurse, children's.  CSW relayed availability if pt has any questions or new needs arise   Jodean Lima, [phone]

## 2012-04-13 ENCOUNTER — Telehealth: Payer: Self-pay | Admitting: Licensed Clinical Social Worker

## 2012-04-13 DIAGNOSIS — K219 Gastro-esophageal reflux disease without esophagitis: Secondary | ICD-10-CM

## 2012-04-13 LAB — FUNGUS CULTURE W SMEAR: Fungal Smear: NONE SEEN

## 2012-04-13 MED ORDER — OMEPRAZOLE 40 MG PO CPDR: 40.0000 mg | DELAYED_RELEASE_CAPSULE | Freq: Every day | ORAL | Status: DC

## 2012-04-13 NOTE — Telephone Encounter (Signed)
Per Dr. Drue Second it is ok to replace the protonix with the prilosec. I will change it and call it in to [employer]

## 2012-04-15 LAB — CULTURE, BLOOD (ROUTINE X 2)
Culture  Setup Time: 201304261435
Culture: NO GROWTH

## 2012-04-15 NOTE — Discharge Summary (Signed)
Internal Medicine Teaching [facility] Discharge Note  Name: Kelsey Rodriguez MRN: [id number] DOB: 08-Jul-1991 21 y.o.  Date of Admission: 04/08/2012  3:50 PM Date of Discharge: 04/15/2012 Attending Physician: Dr. Blanch Media  Discharge Diagnosis: 1. HSV esophagitis - causing nausea, abd pain, dysphagia 2. Fever - TB vs HSV esophagitis vs IRIS 3. Acute Kidney Injury - resolved 4. Tachycardia - resolved 5. HIV - with CD4 = 20, diagnosed 03/12/12, on truvada and isentress 6. Tuberculosis - on RIPE treatment  Discharge Medications: Medication List  As of 04/15/2012  8:53 AM   STOP taking these medications         albendazole 200 MG tablet      sulfamethoxazole-trimethoprim 800-160 MG per tablet         TAKE these medications         alum & mag hydroxide-simeth 200-200-20 MG/5ML suspension   Commonly known as: MAALOX/MYLANTA   Take 30 mLs by mouth every 6 (six) hours as needed (dyspepsia).      azithromycin 600 MG tablet   Commonly known as: ZITHROMAX   Take 1,200 mg by mouth every 7 (seven) days.      dapsone 100 MG tablet   Take 1 tablet (100 mg total) by mouth daily.      emtricitabine-tenofovir 200-300 MG per tablet   Commonly known as: TRUVADA   Take 1 tablet by mouth daily.      ENSURE   Take 237 mLs by mouth 2 (two) times daily between meals.      ethambutol 100 MG tablet   Commonly known as: MYAMBUTOL   Take 800 mg by mouth daily.      fluconazole 100 MG tablet   Commonly known as: DIFLUCAN   Take 1 tablet (100 mg total) by mouth daily.      HYDROcodone-acetaminophen 5-325 MG per tablet   Commonly known as: NORCO   Take 1 tablet by mouth every 6 (six) hours as needed for pain.      isoniazid 300 MG tablet   Commonly known as: NYDRAZID   Take 300 mg by mouth daily.      mirtazapine 15 MG tablet   Commonly known as: REMERON   Take 1 tablet (15 mg total) by mouth at bedtime.      ondansetron 8 MG disintegrating tablet   Commonly known as: ZOFRAN-ODT     Take 1 tablet (8 mg total) by mouth every 8 (eight) hours as needed for nausea (Take 30 minutes before eating).      pyrazinamide 500 MG tablet   Take 1,000 mg by mouth daily.      pyridOXINE 25 MG tablet   Commonly known as: VITAMIN B-6   Take 25 mg by mouth daily.      raltegravir 400 MG tablet   Commonly known as: ISENTRESS   Take 400 mg by mouth 2 (two) times daily.      rifabutin 150 MG capsule   Commonly known as: MYCOBUTIN   Take 300 mg by mouth daily.      valACYclovir 500 MG tablet   Commonly known as: VALTREX   Take 1 tablet (500 mg total) by mouth 2 (two) times daily.            Disposition and follow-up:   Kelsey Rodriguez was discharged from [facility] in stable and improved condition, with improvement in dysphagia and abdominal pain, and resolution of fevers.  The patient will follow-up with Dr. Drue Second in the  ID clinic for further management of TB, HIV, and HSV esophagitis, on 04/20/12.  As the patient's medical problems appear to be predominantly related to ID, we did not schedule the patient for follow-up in the Internal Medicine Resident's Clinic, but we would be happy to accept this patient if referred by Dr. Drue Second at her hospital follow-up appointment.  Follow-up Appointments: Discharge Orders    Future Appointments: Provider: Department: Dept Phone: Center:   04/20/2012 9:45 AM [name], MD [facility] [phone] [facility]     Future Orders Please Complete By Expires   Diet general      Increase activity slowly      Call MD for:  temperature >100.4      Call MD for:  persistant nausea and vomiting      Call MD for:  severe uncontrolled pain         Consultations: Infectious Disease Daiva Eves)  Procedures Performed:  Dg Chest 2 View  04/08/2012  *RADIOLOGY REPORT*  Clinical Data: Chest pain and shortness of breath.  CHEST - 2 VIEW  Comparison: 03/21/2012.  Findings: The cardiac silhouette, mediastinal and hilar contours are  stable. The left lung is much better aerated.  There is a persistent soft tissue mass/infiltrate overlying the region of the aortic knob. It has decreased in size.  No pleural effusions or pulmonary edema.  The bony thorax is intact.  The PICC line has been removed.  IMPRESSION: Much improved left lung aeration since the prior chest x-ray. There is a persistent density overlying the aortic knob but it is smaller.  Original Report Authenticated By: P. Loralie Champagne, M.D.   Dg Chest 2 View  03/21/2012  *RADIOLOGY REPORT*  Clinical Data: Follow up infiltrate.  Recurring fevers  CHEST - 2 VIEW  Comparison: 03/19/2012  Findings: Left upper lobe paramediastinal mass and anterior mediastinal adenopathy is unchanged from previous exam.  The heart size is normal.  There is a small left pleural effusion, stable from previous exam.  Right lung is clear.  IMPRESSION:  1.  No significant change in the left upper lobe mass and anterior mediastinal adenopathy. 2.  No acute infiltrates noted.  Original Report Authenticated By: Rosealee Albee, M.D.   Dg Chest 2 View  03/19/2012  *RADIOLOGY REPORT*  Clinical Data: Chest tube removal.  CHEST - 2 VIEW  Comparison: 03/18/2012  Findings: Interval removal of the left chest tube.  Tiny left apical pneumothorax.  Right PICC line is unchanged.  Heart is mildly enlarged.  There is atelectasis in the left lung base.  IMPRESSION: Interval removal of left chest tube with tiny left apical pneumothorax.  Original Report Authenticated By: Cyndie Chime, M.D.   Dg Chest Port 1 View  03/18/2012  *RADIOLOGY REPORT*  Clinical Data: VATS.  PORTABLE CHEST - 1 VIEW  Comparison: 03/17/2012  Findings: Left chest tube remains in place, unchanged.  No pneumothorax.  Cardiomegaly.  Right PICC line is in place with the tip at the cavoatrial junction.  Patchy left lung opacities may reflect atelectasis.  IMPRESSION: Left chest tube remains in place without pneumothorax.  Slight decreasing lung volumes  with increasing patchy opacity throughout the left lung, likely atelectasis.  Right PICC line tip cavoatrial junction.  Original Report Authenticated By: Cyndie Chime, M.D.   Dg Chest Port 1 View  03/17/2012  *RADIOLOGY REPORT*  Clinical Data: Postop chest tube placement  PORTABLE CHEST - 1 VIEW  Comparison: Portable chest x-ray of 03/16/2012  Findings:  Aeration of the lungs has improved.  Left chest tube remains and no pneumothorax is seen. Heart size is stable.  No bony abnormality is seen.  IMPRESSION: Slightly better aeration.  No pneumothorax.  Left chest tube remains.  Original Report Authenticated By: Juline Patch, M.D.   Dg Chest Portable 1 View  03/16/2012  *RADIOLOGY REPORT*  Clinical Data: Weakness and shortness of breath.  Recent surgery and chest tube placement.  PORTABLE CHEST - 1 VIEW  Comparison: 03/01/2012.  Findings: The left-sided chest tube is in good position.  No definite pneumothorax.  A small amount of subcutaneous emphysema is noted.  Stable density obscuring the left aortic knob.  Streaky areas of atelectasis due to low lung volumes.  No pleural effusion.  IMPRESSION:   Left-sided chest tube in good position without pneumothorax.  2.  Low lung volumes with vascular crowding and areas of atelectasis.  Original Report Authenticated By: P. Loralie Champagne, M.D.    Admission HPI:  The patient is a 21 yo woman, history of HIV and TB, presenting with epigastric pain. The patient was recently admitted 03/10/12-03/25/12 for similar symptoms, diagnosed with pulmonary TB (by lung biopsy of TB nodule, and 3 AFB smears obtained after hospital discharge), and started on 4-drug treatment for TB. She was also diagnosed with HIV at that time, CD4 = 20, VL = 228K, genotype naieve, though not started on HAART until her follow-up ID clinic visit 04/01/12 (she started taking the medications on 4/20). 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   The patient notes that after her last hospitalization, her feelings of epigastric discomfort, dysphagia, and heartburn subsided. Five days ago she started HAART (truvada, raltegravir). Two days ago, she again experienced symptoms of dysphagia to solid foods (though not liquids), as well as a constant burning epigastric and retropharyngeal pain which is worse in the morning and when laying down at night, after eating food or belching, and after taking a deep breath. She notes poor PO intake over the last few days secondary to the pain, and low grade temperatures up to 100.3. She notes no vomiting, diarrhea, or constipation. She was seen in ID clinic today for a follow-up visit with these symptoms, and though repeat CXR showed no appreciable change, she was found to be tachycardic with HR in 120's, and as an IV could not be started for IV hydration (in setting of tachycardia and poor PO intake), and as patient was recently neutropenic, the patient was admitted for IV hydration, pain control, further evaluation of symptoms, and possible fever work-up.  Admission Physical Exam Blood pressure 106/74, pulse 94, temperature 98.9 F (37.2 C), temperature source Oral, resp. rate 18, height 5\' 5"  (1.651 m), weight 124 lb 12.5 oz (56.6 kg), last menstrual period 03/29/2012, SpO2 100.00%.  General: alert, cooperative, and in no apparent distress HEENT: pupils equal round and reactive to light, vision grossly intact, oropharynx clear and non-erythematous  Neck: supple, no lymphadenopathy Lungs: clear to ascultation bilaterally, normal work of respiration, no wheezes, rales, ronchi. Chest significantly tender to sternal palpation Heart: tachycardic, regular rhythm, no murmurs, gallops, or rubs Abdomen: soft, moderately tender to epigastric palpation, non-distended, normal bowel sounds  Extremities: no cyanosis, clubbing, or edema Neurologic: alert & oriented X3,  cranial nerves II-XII intact, strength grossly intact, sensation intact to light touch  Admission Labs Basic Metabolic Panel:  Basename  04/08/12 1145   NA  136   K  4.1   CL  100   CO2  25   GLUCOSE  82   BUN  6   CREATININE  0.69   CALCIUM  9.5   MG  --   PHOS  --    Liver Function Tests:  Basename  04/08/12 1145   AST  20   ALT  18   ALKPHOS  92   BILITOT  0.3   PROT  8.1   ALBUMIN  3.3*    CBC:  Basename  04/08/12 1145   WBC  4.3   NEUTROABS  2.6   HGB  10.2*   HCT  30.6*   MCV  87.9   PLT  163     Hospital Course by problem list: 1. HSV esophagitis - The patient was recently admitted 2-3 weeks prior to this current admission with similar symptoms, and found by EGD to have multiple ulcers, which were positive for HSV by biopsy culture.  The patient was treated with a 21-day course of valtrex 03/17/12-04/08/12 (though there is some confusion about the stop date of valtrex, and the patient may have stopped taking this medication 04/01/12, she is unsure), with improvement in symptoms initially, but subsequently with recurrence of symptoms of dysphagia, epigastric pain, and esophageal "burning" a few days prior to this admission.  On admission, the patient was ruled out for ACS with CE's negative x3, given her report of chest "burning".  It was our clinical assessment that her symptoms were likely caused by her known esophageal ulcers, which can be painful and take time to heal, rather than by recurrence or inadequate treatment for esophageal HSV.  She was treated initially with IV acyclovir, which was then transitioned to PO valtrex (prophylactic dose).  The patient was also given protonix, maalox prn, zofran prn, and hydrocodone prn for control of symptoms while these lesions heal.  The patient noted poor PO intake due to both her symptoms and low appetite, and so she was also started on remeron for appetite stimulation.    2. Fever - The patient was admitted with T = 100.4,  concerning for emerging neutropenic fever, given her neutropenia seen on cbc 1 week prior to admission.  However repeat CBC on admission showed that the patient was no longer neutropenic.  During hospitalization, the patient developed fevers to 101.4, which were thought to be due to her known TB, and which may represent some degree of immune reconstitution after starting HAART.  Blood cultures were drawn, and were found to be negative.  Of note, the patient was given 1 dose of Vancomycin and 1 dose of Zosyn while the diagnosis of neutropenic fever was being considered, but these were discontinued based on the patient's normal neutrophil count.  The patient was afebrile for 48 hours prior to discharge, though we would not be surprised if her fevers recurred after discharge, given her known active infection.  The patient was discharged on her home RIPE treatment for TB and HAART treatment for HIV, as well as dapsone and azithromycin for prophylaxis (CD4 = 20).  3. Acute Kidney Injury - the patient presented with an elevated creatinine of 1.12, with a FeNa of 0.6, likely representing prerenal AKI secondary to volume depletion from poor PO input.  The patient was rehydrated, and creatinine decreased to 0.86 by discharge.  4. Tachycardia - the patient presented with tachycardia to the 120's, likely also secondary to volume depletion.  The patient's heart rate decreased after  rehydration with IV fluids.  5. HIV - The patient was diagnosed with HIV on 03/12/12, with a CD4 of 20, and VL of 228,149.  HAART treatment was initially held due to initiation of TB treatment at that time, but was subsequently started on HAART therapy at clinic follow-up, taking her first doses of Isentress and Truvada on 04/03/12.  The patient's medications were continued throughout hospitalization and at discharge.  The patient's prophylactic Bactrim was discontinued on admission out of concern for its contribution to neutropenia, and she was  instead started on Dapsone for PCP prophylaxis, which was continued at hospital discharge.  She was continued on Azithromycin for MAC prophylaxis.  6. Tuberculosis - The patient was diagnosed with Pulmonary TB during hospitalization 03/10/12-03/25/12, and was started on 4-drug RIPE therapy.  The patient's medications were continued during this hospitalization, and at discharge.  Per ID's request, we attempted to obtain an induced sputum sample during this hospitalization to demonstrate clearance of TB, but the patient was unable to produce an adequate sputum sample despite several attempts, so this test was not performed.  Time spent on discharge: 45 minutes  Discharge Vitals:  BP 118/78  Pulse 90  Temp(Src) 98.7 F (37.1 C) (Oral)  Resp 18  Ht 5\' 5"  (1.651 m)  Wt 125 lb 3.5 oz (56.8 kg)  BMI 20.84 kg/m2  SpO2 98%  LMP 03/29/2012  Discharge Labs:  Basic Metabolic Panel:  Lab  04/12/12 0540  04/11/12 0612   NA  139  138   K  3.3*  3.7   CL  107  107   CO2  23  22   GLUCOSE  82  85   BUN  3*  4*   CREATININE  0.86  0.88   CALCIUM  8.8  8.7   MG  --  --   PHOS  --  --    Liver Function Tests:  Lab  04/09/12 0314  04/08/12 1145   AST  31  20   ALT  19  18   ALKPHOS  93  92   BILITOT  0.4  0.3   PROT  8.6*  8.1   ALBUMIN  3.4*  3.3*    CBC:  Lab  04/12/12 0540  04/11/12 0612   WBC  3.2*  3.4*   NEUTROABS  1.7  1.8   HGB  8.9*  8.7*   HCT  26.8*  26.4*   MCV  86.7  87.1   PLT  182  192      Signed: Jeweline Reif 04/15/2012, 8:53 AM

## 2012-04-16 LAB — CULTURE, BLOOD (ROUTINE X 2)
Culture  Setup Time: 201304270129
Culture: NO GROWTH

## 2012-04-20 ENCOUNTER — Ambulatory Visit (INDEPENDENT_AMBULATORY_CARE_PROVIDER_SITE_OTHER): Payer: Self-pay | Admitting: Internal Medicine

## 2012-04-20 ENCOUNTER — Encounter: Payer: Self-pay | Admitting: Internal Medicine

## 2012-04-20 VITALS — BP 127/86 | HR 104 | Temp 98.6°F | Wt 127.4 lb

## 2012-04-20 DIAGNOSIS — B20 Human immunodeficiency virus [HIV] disease: Secondary | ICD-10-CM

## 2012-04-20 DIAGNOSIS — Z21 Asymptomatic human immunodeficiency virus [HIV] infection status: Secondary | ICD-10-CM

## 2012-04-20 LAB — COMPLETE METABOLIC PANEL WITH GFR
ALT: 25 U/L (ref 0–35)
AST: 31 U/L (ref 0–37)
Albumin: 3.8 g/dL (ref 3.5–5.2)
Alkaline Phosphatase: 81 U/L (ref 39–117)
BUN: 7 mg/dL (ref 6–23)
CO2: 25 mEq/L (ref 19–32)
Calcium: 9.7 mg/dL (ref 8.4–10.5)
Chloride: 102 mEq/L (ref 96–112)
Creat: 0.79 mg/dL (ref 0.50–1.10)
GFR, Est African American: >89 mL/min
GFR, Est Non African American: >89 mL/min
Glucose, Bld: 66 mg/dL — ABNORMAL LOW (ref 70–99)
Potassium: 4 mEq/L (ref 3.5–5.3)
Sodium: 136 mEq/L (ref 135–145)
Total Bilirubin: 0.3 mg/dL (ref 0.3–1.2)
Total Protein: 8 g/dL (ref 6.0–8.3)

## 2012-04-20 LAB — CBC WITH DIFFERENTIAL/PLATELET
Basophils Absolute: 0 10*3/uL (ref 0.0–0.1)
Basophils Relative: 1 % (ref 0–1)
Eosinophils Absolute: 0.2 10*3/uL (ref 0.0–0.7)
Eosinophils Relative: 4 % (ref 0–5)
HCT: 31.1 % — ABNORMAL LOW (ref 36.0–46.0)
Hemoglobin: 10.2 g/dL — ABNORMAL LOW (ref 12.0–15.0)
Lymphocytes Relative: 20 % (ref 12–46)
Lymphs Abs: 0.9 10*3/uL (ref 0.7–4.0)
MCH: 29.1 pg (ref 26.0–34.0)
MCHC: 32.8 g/dL (ref 30.0–36.0)
MCV: 88.6 fL (ref 78.0–100.0)
Monocytes Absolute: 0.6 10*3/uL (ref 0.1–1.0)
Monocytes Relative: 13 % — ABNORMAL HIGH (ref 3–12)
Neutro Abs: 2.6 10*3/uL (ref 1.7–7.7)
Neutrophils Relative %: 62 % (ref 43–77)
Platelets: 178 10*3/uL (ref 150–400)
RBC: 3.51 MIL/uL — ABNORMAL LOW (ref 3.87–5.11)
RDW: 16.9 % — ABNORMAL HIGH (ref 11.5–15.5)
WBC: 4.2 10*3/uL (ref 4.0–10.5)

## 2012-04-20 NOTE — Progress Notes (Signed)
Subjective:    Patient ID: Kelsey Rodriguez, female    DOB: Dec 03, 1991, 21 y.o.   MRN: [id number]  HPI 21yo F with newly diagnosed HIV with co-infection o fpulmTb with extrapulm mass currently on 7th week of pulm tx TB(rifabutin, IPE) and 3rd week of HIV tx with RAL/Truvada. CD 4 count of 20(8%)/VL 228,149 in March 2013. When she was last seen in ID clinic 2-3 wks ago, she was admitted for evaluation of fevers, chest pain  Since being discharged on April 29th, she is feeling better. No fever/chill/nightsweats. Only occasional swallowing discomfort. Starting to eat better.  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 Authorizing Provider  azithromycin (ZITHROMAX) 600 MG tablet Take 1,200 mg by mouth every 7 (seven) days.   Yes Historical Provider, MD  dapsone 100 MG tablet Take 1 tablet (100 mg total) by mouth daily. 04/12/12 07/11/12 Yes Linward Headland, MD  emtricitabine-tenofovir (TRUVADA) 200-300 MG per tablet Take 1 tablet by mouth daily.   Yes Historical Provider, MD  ENSURE (ENSURE) Take 237 mLs by mouth 2 (two) times daily between meals.   Yes Historical Provider, MD  ethambutol (MYAMBUTOL) 100 MG tablet Take 800 mg by mouth daily.   Yes Historical Provider, MD  fluconazole (DIFLUCAN) 100 MG tablet Take 1 tablet (100 mg total) by mouth daily. 04/12/12 04/23/12 Yes Linward Headland, MD  HYDROcodone-acetaminophen (NORCO) 5-325 MG per tablet Take 1 tablet by mouth every 6 (six) hours as needed for pain. 04/12/12 04/22/12 Yes Linward Headland, MD  isoniazid (NYDRAZID) 300 MG tablet Take 300 mg by mouth daily.   Yes Historical Provider, MD  mirtazapine (REMERON) 15 MG tablet Take 1 tablet (15 mg total) by mouth at bedtime. 04/12/12 05/12/12 Yes Linward Headland, MD  omeprazole (PRILOSEC) 40 MG capsule Take 1 capsule (40 mg total)  by mouth daily. 04/13/12 04/13/13 Yes [name], MD  alum & mag hydroxide-simeth (MAALOX/MYLANTA) 200-200-20 MG/5ML suspension Take 30 mLs by mouth every 6 (six) hours as needed (dyspepsia). 04/12/12 04/22/12  Linward Headland, MD  ondansetron (ZOFRAN ODT) 8 MG disintegrating tablet Take 1 tablet (8 mg total) by mouth every 8 (eight) hours as needed for nausea (Take 30 minutes before eating). 04/12/12 04/19/12  Linward Headland, MD  pyrazinamide 500 MG tablet Take 1,000 mg by mouth daily.    Historical Provider, MD  pyridOXINE (VITAMIN B-6) 25 MG tablet Take 25 mg by mouth daily.    Historical Provider, MD  raltegravir (ISENTRESS) 400 MG tablet Take 400 mg by mouth 2 (two) times daily.    Historical Provider, MD  rifabutin (MYCOBUTIN) 150 MG capsule Take 300 mg by mouth daily.    Historical Provider, MD  valACYclovir (VALTREX) 500 MG tablet Take 1 tablet (500 mg total) by mouth 2 (two) times daily. 04/12/12   Linward Headland, MD    Review of Systems Review of Systems  Constitutional: Negative for fever, chills, diaphoresis, activity change, appetite change, fatigue and unexpected weight change.  HENT: Negative for congestion, sore throat, rhinorrhea, sneezing, trouble swallowing and sinus pressure.  Eyes: Negative for photophobia and visual disturbance.  Respiratory: Negative for cough, chest tightness, shortness of breath, wheezing and stridor.  Cardiovascular: Negative for chest pain, palpitations and leg swelling.  Gastrointestinal: Negative for nausea, vomiting, abdominal pain, diarrhea,  constipation, blood in stool, abdominal distention and anal bleeding.  Genitourinary: Negative for dysuria, hematuria, flank pain and difficulty urinating.  Musculoskeletal: Negative for myalgias, back pain, joint swelling, arthralgias and gait problem.  Skin: Negative for color change, pallor, rash and wound.  Neurological: Negative for dizziness, tremors, weakness and light-headedness.  Hematological: Negative for  adenopathy. Does not bruise/bleed easily.  Psychiatric/Behavioral: Negative for behavioral problems, confusion, sleep disturbance, dysphoric mood, decreased concentration and agitation.       Objective:   Physical Exam BP 127/86  Pulse 104  Temp(Src) 98.6 F (37 C) (Oral)  Wt 127 lb 6.4 oz (57.788 kg)  LMP 03/29/2012  General Appearance:    Alert, cooperative, no distress, appears stated age  Head:    Normocephalic, without obvious abnormality, atraumatic  Eyes:    PERRL, conjunctiva/corneas clear, EOM's intact,   Ears:    Normal TM's and external ear canals, both ears  Nose:   Nares normal, septum midline, mucosa normal, no drainage    or sinus tenderness  Throat:   Lips, mucosa, and tongue normal; teeth and gums normal  Neck:   Supple, symmetrical, trachea midline, no adenopathy;     Back:     Symmetric, no curvature, ROM normal, no CVA tenderness  Lungs:     Clear to auscultation bilaterally, respirations unlabored  Chest Wall:    Cheloid scarring from thoracotomy incision, medial edge appears to have retained suture poking thru skin. Patient reports tenderness. No fluctuance no erythema.   Heart:    Regular rate and rhythm, S1 and S2 normal, no murmur, rub   or gallop     Abdomen:     Soft, non-tender, bowel sounds active all four quadrants,    no masses, no organomegaly        Extremities:   Extremities normal, atraumatic, no cyanosis or edema  Pulses:   2+ and symmetric all extremities  Skin:   Skin color, texture, turgor normal, no rashes or lesions  Lymph nodes:   Cervical, supraclavicular, and axillary nodes normal          Assessment & Plan:  HIV= will check for CD 4 count and viral load at this visit, somewhat early, but have low threshold to for IRIS. Continue with ral/truvada  TB= health dept administering R(rifapentine)IPE plus vit b6. Doing well with pill burden. No growth on our cultures from early April, only + mTB pcr. Will let [facility] know that we do not  have documented clearance of afb in sputum  OI proph = continue with dapsone 100mg  daily and azithro 1200mg  Qwk  Dysphagia= continue with fluc 100mg  daily. Can discontinue  HSV esophagitis= on valacyclovir 500mg  BID  Retained suture in chest wall = refer back to surgery follow-up.  RTC in 4 wks.

## 2012-04-21 ENCOUNTER — Telehealth: Payer: Self-pay | Admitting: *Deleted

## 2012-04-21 LAB — T-HELPER CELL (CD4) - (RCID CLINIC ONLY)
CD4 % Helper T Cell: 9 % — ABNORMAL LOW (ref 33–55)
CD4 T Cell Abs: 70 uL — ABNORMAL LOW (ref 400–2700)

## 2012-04-21 NOTE — Telephone Encounter (Signed)
Amy @ [facility] called and advised that the patient reported that she is having pain in her chest at her surgical site. The pain is piercing and it is sore to the touch and that the stiches are still there. It is not producing any drainage nor does it have an odor. She does have occasional fevers but is not sure it is from this issue. Patient reports that she was in clinic yesterday and was told by provider to have a surgical follow up to check the site.   Called Dr Dennie Maizes office and reported the patients symptoms and that our provider recommended that she follow up with them and they gave her an appt for tomorrow 04/22/12 at 930 am. I called Amy @ [facility] and Ms [name] back and gave them the information.

## 2012-04-22 ENCOUNTER — Encounter (INDEPENDENT_AMBULATORY_CARE_PROVIDER_SITE_OTHER): Payer: Self-pay

## 2012-04-22 DIAGNOSIS — R222 Localized swelling, mass and lump, trunk: Secondary | ICD-10-CM

## 2012-04-22 LAB — HIV-1 RNA QUANT-NO REFLEX-BLD
HIV 1 RNA Quant: 179 copies/mL — ABNORMAL HIGH (ref <20)
HIV-1 RNA Quant, Log: 2.25 {Log} — ABNORMAL HIGH (ref <1.30)

## 2012-04-26 ENCOUNTER — Telehealth: Payer: Self-pay | Admitting: *Deleted

## 2012-04-26 NOTE — Telephone Encounter (Signed)
Amy from [facility] came by to tell me the pt had texted her with c/o joint pain & swollen feet. I called pt who said it started about 6 days ago & she can "barely" walk due to swollen feet. There are no appts here for all of this week & pt does not have insurance. She is applying for medicaid.  I called the health dept & left a message for Awilda Metro, TB nurse asking her to call me back. I want to know if there are such side effects from TB meds. Her usual nurse Claris Che, comes to her home between 3 & 4pm daily.  Will check with mds here to see if they can squeeze her into their schedule today. Unable to fit her in today. Discussed with Tomasita Morrow, RN. Sent message to Dr. Drue Second to find out where I should send the pt or if she is able to see her

## 2012-04-26 NOTE — Telephone Encounter (Signed)
I heard back from [place] who sees her daily. The isoniazid may cause joint pain. I heard back from Dr. Drue Second. She will see her at 8:30am tomorrow. Tomasita Morrow, RN will put her on the schedule & Amy will pick her up. Pt notified

## 2012-04-27 ENCOUNTER — Ambulatory Visit (INDEPENDENT_AMBULATORY_CARE_PROVIDER_SITE_OTHER): Payer: Medicaid Other | Admitting: Internal Medicine

## 2012-04-27 VITALS — Temp 98.2°F | Ht 65.0 in | Wt 128.0 lb

## 2012-04-27 DIAGNOSIS — O98519 Other viral diseases complicating pregnancy, unspecified trimester: Secondary | ICD-10-CM

## 2012-04-27 DIAGNOSIS — A150 Tuberculosis of lung: Secondary | ICD-10-CM

## 2012-04-27 DIAGNOSIS — B20 Human immunodeficiency virus [HIV] disease: Secondary | ICD-10-CM

## 2012-04-27 DIAGNOSIS — Z21 Asymptomatic human immunodeficiency virus [HIV] infection status: Secondary | ICD-10-CM

## 2012-04-27 LAB — AFB CULTURE, BLOOD

## 2012-04-29 ENCOUNTER — Ambulatory Visit: Payer: Self-pay | Admitting: Internal Medicine

## 2012-04-30 ENCOUNTER — Other Ambulatory Visit: Payer: Self-pay | Admitting: *Deleted

## 2012-04-30 DIAGNOSIS — R079 Chest pain, unspecified: Secondary | ICD-10-CM

## 2012-04-30 MED ORDER — HYDROCODONE-ACETAMINOPHEN 5-325 MG PO TABS: 1.0000 | ORAL_TABLET | Freq: Three times a day (TID) | ORAL | Status: DC | PRN

## 2012-05-13 LAB — AFB CULTURE WITH SMEAR (NOT AT ARMC)

## 2012-05-17 ENCOUNTER — Telehealth: Payer: Self-pay | Admitting: *Deleted

## 2012-05-17 NOTE — Telephone Encounter (Signed)
Mycobacterial testing preliminary show sensitivity to pyrazinamide. Other 3 drugs are pending. I called Dr. Drue Second & relayed thi sto her. No orders

## 2012-05-19 ENCOUNTER — Telehealth: Payer: Self-pay | Admitting: *Deleted

## 2012-05-19 NOTE — Telephone Encounter (Signed)
Spoke w/ pt to remind her of appt tomorrow. 

## 2012-05-20 ENCOUNTER — Other Ambulatory Visit (HOSPITAL_COMMUNITY)
Admission: RE | Admit: 2012-05-20 | Discharge: 2012-05-20 | Disposition: A | Discharge status: Home / Self Care | Payer: Medicaid Other | Source: Ambulatory Visit | Attending: Internal Medicine | Admitting: Internal Medicine

## 2012-05-20 ENCOUNTER — Ambulatory Visit (INDEPENDENT_AMBULATORY_CARE_PROVIDER_SITE_OTHER): Payer: Medicaid Other | Admitting: Internal Medicine

## 2012-05-20 ENCOUNTER — Encounter: Payer: Self-pay | Admitting: Internal Medicine

## 2012-05-20 ENCOUNTER — Ambulatory Visit: Payer: Self-pay | Admitting: Internal Medicine

## 2012-05-20 VITALS — BP 123/82 | HR 103 | Temp 98.1°F | Ht 65.0 in | Wt 131.0 lb

## 2012-05-20 DIAGNOSIS — M2550 Pain in unspecified joint: Secondary | ICD-10-CM

## 2012-05-20 DIAGNOSIS — Z124 Encounter for screening for malignant neoplasm of cervix: Secondary | ICD-10-CM

## 2012-05-20 DIAGNOSIS — Z01419 Encounter for gynecological examination (general) (routine) without abnormal findings: Secondary | ICD-10-CM | POA: Insufficient documentation

## 2012-05-20 LAB — CBC WITH DIFFERENTIAL/PLATELET
Basophils Absolute: 0 10*3/uL (ref 0.0–0.1)
Basophils Relative: 1 % (ref 0–1)
Eosinophils Absolute: 0.2 10*3/uL (ref 0.0–0.7)
Eosinophils Relative: 7 % — ABNORMAL HIGH (ref 0–5)
HCT: 29.8 % — ABNORMAL LOW (ref 36.0–46.0)
Hemoglobin: 10 g/dL — ABNORMAL LOW (ref 12.0–15.0)
Lymphocytes Relative: 34 % (ref 12–46)
Lymphs Abs: 1.1 10*3/uL (ref 0.7–4.0)
MCH: 28.1 pg (ref 26.0–34.0)
MCHC: 33.6 g/dL (ref 30.0–36.0)
MCV: 83.7 fL (ref 78.0–100.0)
Monocytes Absolute: 0.5 10*3/uL (ref 0.1–1.0)
Monocytes Relative: 16 % — ABNORMAL HIGH (ref 3–12)
Neutro Abs: 1.4 10*3/uL — ABNORMAL LOW (ref 1.7–7.7)
Neutrophils Relative %: 42 % — ABNORMAL LOW (ref 43–77)
Platelets: 243 10*3/uL (ref 150–400)
RBC: 3.56 MIL/uL — ABNORMAL LOW (ref 3.87–5.11)
RDW: 17.8 % — ABNORMAL HIGH (ref 11.5–15.5)
WBC: 3.2 10*3/uL — ABNORMAL LOW (ref 4.0–10.5)

## 2012-05-20 NOTE — Assessment & Plan Note (Signed)
Unclear at this point.  It certainly can be due to her recovery, IRIS, HIV.  DDx also includes SLE, polyarthritis, reactive arthritis, fibromyalgia, rhematoid arthritis.  It is symmetric suggesting more of systemic process.  Unlikely gout or septic arthritis.  I will check appropriate labs but eventual diagnosis may require that she recover from the Tb and HIV and have a reoccurrence to establish any particular diagnosis.  I have recommended to continue with ibuprofen. RTC in 1 week to review results.

## 2012-05-20 NOTE — Progress Notes (Signed)
  Subjective:    Patient ID: Kelsey Rodriguez, female    DOB: Jun 26, 1991, 21 y.o.   MRN: [id number]  HPI Here to be evaluated for a rash and arthralgias.  Developed about 1 week ago.  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  She has arthralgias in both knees mainly, as well as ankles, wrists.  She endorses some swelling, particularly in knees.  No rash.  Also some myalgias in her thighs.  No fever.  No family history of any rheumatic disease she is aware of.  No chest pain, no SOB.     Review of Systems  Constitutional: Positive for activity change. Negative for fever, chills, fatigue and unexpected weight change.  HENT: Negative for sore throat and neck stiffness.   Respiratory: Negative for cough and shortness of breath.   Cardiovascular: Negative for chest pain and leg swelling.  Gastrointestinal: Negative for nausea, abdominal pain and diarrhea.  Musculoskeletal: Positive for myalgias, back pain, joint swelling and arthralgias. Negative for gait problem.  Skin: Negative for pallor and rash.  Neurological: Positive for headaches. Negative for dizziness.  Hematological: Negative for adenopathy.       Objective:   Physical Exam  Constitutional: She appears well-developed and well-nourished. No distress.  Cardiovascular: Normal rate, regular rhythm and normal heart sounds.  Exam reveals no gallop and no friction rub.   No murmur heard. Pulmonary/Chest: Effort normal and breath sounds normal. No respiratory distress. She has no wheezes. She has no rales.  Musculoskeletal: Normal range of motion. She exhibits no edema and no tenderness.       Normal ROM of wrists, knees, ankles  Lymphadenopathy:    She has no cervical adenopathy.  Skin: Skin is warm and dry. No rash noted.          Assessment & Plan:

## 2012-05-21 LAB — URINALYSIS
Bilirubin Urine: NEGATIVE
Glucose, UA: NEGATIVE mg/dL
Hgb urine dipstick: NEGATIVE
Ketones, ur: NEGATIVE mg/dL
Leukocytes, UA: NEGATIVE
Nitrite: NEGATIVE
Protein, ur: NEGATIVE mg/dL
Specific Gravity, Urine: 1.02 (ref 1.005–1.030)
Urobilinogen, UA: 0.2 mg/dL (ref 0.0–1.0)
pH: 6 (ref 5.0–8.0)

## 2012-05-21 LAB — RHEUMATOID FACTOR: Rhuematoid fact SerPl-aCnc: <10 IU/mL (ref <=14)

## 2012-05-21 LAB — T-HELPER CELL (CD4) - (RCID CLINIC ONLY)
CD4 % Helper T Cell: 12 % — ABNORMAL LOW (ref 33–55)
CD4 T Cell Abs: 130 uL — ABNORMAL LOW (ref 400–2700)

## 2012-05-21 LAB — ANA: Anti Nuclear Antibody(ANA): NEGATIVE

## 2012-05-21 LAB — CYCLIC CITRUL PEPTIDE ANTIBODY, IGG: Cyclic Citrullin Peptide Ab: <2 U/mL (ref 0.0–5.0)

## 2012-05-21 LAB — URIC ACID: Uric Acid, Serum: 7.9 mg/dL — ABNORMAL HIGH (ref 2.4–7.0)

## 2012-05-21 LAB — RPR

## 2012-05-21 LAB — SEDIMENTATION RATE: Sed Rate: 90 mm/hr — ABNORMAL HIGH (ref 0–22)

## 2012-05-24 ENCOUNTER — Telehealth: Payer: Self-pay | Admitting: *Deleted

## 2012-05-24 NOTE — Telephone Encounter (Signed)
Most recent Uric acid and CBC faxed to [facility] per request of TB Section, Claris Che, Charity fundraiser.

## 2012-05-26 LAB — PARVOVIRUS B19 IGM: Parvovirus B19 IgM: 0.1 index (ref <0.9)

## 2012-05-27 ENCOUNTER — Ambulatory Visit (INDEPENDENT_AMBULATORY_CARE_PROVIDER_SITE_OTHER): Payer: Medicaid Other | Admitting: Internal Medicine

## 2012-05-27 ENCOUNTER — Encounter: Payer: Self-pay | Admitting: *Deleted

## 2012-05-27 ENCOUNTER — Encounter: Payer: Self-pay | Admitting: Internal Medicine

## 2012-05-27 ENCOUNTER — Ambulatory Visit: Payer: Self-pay | Admitting: Internal Medicine

## 2012-05-27 VITALS — BP 124/81 | HR 106 | Temp 98.0°F | Ht 62.0 in | Wt 130.0 lb

## 2012-05-27 DIAGNOSIS — R238 Other skin changes: Secondary | ICD-10-CM

## 2012-05-27 DIAGNOSIS — B20 Human immunodeficiency virus [HIV] disease: Secondary | ICD-10-CM

## 2012-05-27 DIAGNOSIS — Z21 Asymptomatic human immunodeficiency virus [HIV] infection status: Secondary | ICD-10-CM

## 2012-05-27 DIAGNOSIS — L988 Other specified disorders of the skin and subcutaneous tissue: Secondary | ICD-10-CM

## 2012-05-27 DIAGNOSIS — M2550 Pain in unspecified joint: Secondary | ICD-10-CM

## 2012-05-27 DIAGNOSIS — N898 Other specified noninflammatory disorders of vagina: Secondary | ICD-10-CM

## 2012-05-27 MED ORDER — FLUCONAZOLE 150 MG PO TABS: 150.0000 mg | ORAL_TABLET | Freq: Once | ORAL | Status: AC

## 2012-05-27 NOTE — Progress Notes (Signed)
Patient ID: Kelsey Rodriguez, female   DOB: August 31, 1991, 21 y.o.   MRN: [id number] Pt told the her PAP smear results were normal.

## 2012-05-28 LAB — URINALYSIS, MICROSCOPIC ONLY
Bacteria, UA: NONE SEEN
Casts: NONE SEEN
Crystals: NONE SEEN

## 2012-05-28 LAB — URINALYSIS, ROUTINE W REFLEX MICROSCOPIC
Glucose, UA: NEGATIVE mg/dL
Hgb urine dipstick: NEGATIVE
Ketones, ur: 15 mg/dL — AB
Nitrite: POSITIVE — AB
Protein, ur: NEGATIVE mg/dL
Specific Gravity, Urine: 1.03 (ref 1.005–1.030)
Urobilinogen, UA: 1 mg/dL (ref 0.0–1.0)
pH: 5.5 (ref 5.0–8.0)

## 2012-05-30 DIAGNOSIS — R238 Other skin changes: Secondary | ICD-10-CM

## 2012-05-30 DIAGNOSIS — N898 Other specified noninflammatory disorders of vagina: Secondary | ICD-10-CM | POA: Insufficient documentation

## 2012-05-30 HISTORY — DX: Other skin changes: R23.8

## 2012-05-30 NOTE — Assessment & Plan Note (Signed)
On foot.  She will be referred to dermatology per her request.  I suspect it will resolve when her immune system improves.

## 2012-05-30 NOTE — Assessment & Plan Note (Signed)
She will be scheduled for follow up.

## 2012-05-30 NOTE — Assessment & Plan Note (Signed)
Will check ua and gc/chlamydia.  Will prescribe fluconazole.

## 2012-05-30 NOTE — Assessment & Plan Note (Signed)
Improving with NSAIDS.  May be reactive or related to HIV.  Uric acid is up but clinically not c/w gout.   She will be referred to internal medicine for further monitoring.

## 2012-05-30 NOTE — Progress Notes (Signed)
  Subjective:    Patient ID: Kelsey Rodriguez, female    DOB: 1991/07/16, 21 y.o.   MRN: [id number]  HPI Here for follow up of the joint pain.  Is much better with NSAIDS.  Work up has been negative.  She did have an elevated uric acid requested by the health department.  No fever, no chills. Also some white vaginal discharge, no pyuria, no dysuria.     Review of Systems  Constitutional: Negative for fever, chills and fatigue.  Musculoskeletal: Positive for arthralgias. Negative for myalgias and joint swelling.       Some knee arthralgias but overall much improved.   Skin: Positive for rash.       Has a lesion on her foot, clear bullae, that have been present for several months.        Objective:   Physical Exam  Constitutional: She appears well-developed and well-nourished. No distress.  Musculoskeletal: Normal range of motion. She exhibits no edema.  Skin:       + bullae          Assessment & Plan:

## 2012-06-09 ENCOUNTER — Encounter: Payer: Self-pay | Admitting: *Deleted

## 2012-06-09 NOTE — Progress Notes (Signed)
Patient ID: Kelsey Rodriguez, female   DOB: 1991/09/22, 21 y.o.   MRN: [id number]  Called pt's PCP listed on her Medicaid Card and they have not seen her in their practice. Pt would need to be seen by them or if she would like to choose another PCP she would need to call her Medicaid case-worker and set this up before I can refer her to a dermatologist. I have called pt and left message about the above stated. I will defer the referral for now. Tacey Heap RN

## 2012-06-10 ENCOUNTER — Other Ambulatory Visit: Payer: Medicaid Other

## 2012-06-10 ENCOUNTER — Other Ambulatory Visit (HOSPITAL_COMMUNITY)
Admission: RE | Admit: 2012-06-10 | Discharge: 2012-06-10 | Disposition: A | Discharge status: Home / Self Care | Payer: Medicaid Other | Source: Ambulatory Visit | Attending: Internal Medicine | Admitting: Internal Medicine

## 2012-06-10 DIAGNOSIS — Z113 Encounter for screening for infections with a predominantly sexual mode of transmission: Secondary | ICD-10-CM | POA: Insufficient documentation

## 2012-06-10 DIAGNOSIS — B20 Human immunodeficiency virus [HIV] disease: Secondary | ICD-10-CM

## 2012-06-10 LAB — CBC WITH DIFFERENTIAL/PLATELET
Basophils Absolute: 0 10*3/uL (ref 0.0–0.1)
Basophils Relative: 1 % (ref 0–1)
Eosinophils Absolute: 0.3 10*3/uL (ref 0.0–0.7)
Eosinophils Relative: 8 % — ABNORMAL HIGH (ref 0–5)
HCT: 28.5 % — ABNORMAL LOW (ref 36.0–46.0)
Hemoglobin: 9.4 g/dL — ABNORMAL LOW (ref 12.0–15.0)
Lymphocytes Relative: 48 % — ABNORMAL HIGH (ref 12–46)
Lymphs Abs: 1.5 10*3/uL (ref 0.7–4.0)
MCH: 26.6 pg (ref 26.0–34.0)
MCHC: 33 g/dL (ref 30.0–36.0)
MCV: 80.5 fL (ref 78.0–100.0)
Monocytes Absolute: 0.3 10*3/uL (ref 0.1–1.0)
Monocytes Relative: 11 % (ref 3–12)
Neutro Abs: 1 10*3/uL — ABNORMAL LOW (ref 1.7–7.7)
Neutrophils Relative %: 32 % — ABNORMAL LOW (ref 43–77)
Platelets: 240 10*3/uL (ref 150–400)
RBC: 3.54 MIL/uL — ABNORMAL LOW (ref 3.87–5.11)
RDW: 17.7 % — ABNORMAL HIGH (ref 11.5–15.5)
WBC: 3.1 10*3/uL — ABNORMAL LOW (ref 4.0–10.5)

## 2012-06-10 LAB — COMPLETE METABOLIC PANEL WITH GFR
ALT: 15 U/L (ref 0–35)
AST: 27 U/L (ref 0–37)
Albumin: 4.1 g/dL (ref 3.5–5.2)
Alkaline Phosphatase: 71 U/L (ref 39–117)
BUN: 6 mg/dL (ref 6–23)
CO2: 24 mEq/L (ref 19–32)
Calcium: 9.4 mg/dL (ref 8.4–10.5)
Chloride: 104 mEq/L (ref 96–112)
Creat: 0.68 mg/dL (ref 0.50–1.10)
GFR, Est African American: >89 mL/min
GFR, Est Non African American: >89 mL/min
Glucose, Bld: 93 mg/dL (ref 70–99)
Potassium: 3.8 mEq/L (ref 3.5–5.3)
Sodium: 137 mEq/L (ref 135–145)
Total Bilirubin: 0.3 mg/dL (ref 0.3–1.2)
Total Protein: 8 g/dL (ref 6.0–8.3)

## 2012-06-11 LAB — HIV-1 RNA QUANT-NO REFLEX-BLD
HIV 1 RNA Quant: <20 copies/mL (ref <20)
HIV-1 RNA Quant, Log: <1.3 {Log} (ref <1.30)

## 2012-06-11 LAB — T-HELPER CELL (CD4) - (RCID CLINIC ONLY)
CD4 % Helper T Cell: 13 % — ABNORMAL LOW (ref 33–55)
CD4 T Cell Abs: 210 uL — ABNORMAL LOW (ref 400–2700)

## 2012-06-15 LAB — AFB CULTURE WITH SMEAR (NOT AT ARMC)

## 2012-06-24 ENCOUNTER — Ambulatory Visit (INDEPENDENT_AMBULATORY_CARE_PROVIDER_SITE_OTHER): Payer: No Typology Code available for payment source | Admitting: Internal Medicine

## 2012-06-24 ENCOUNTER — Other Ambulatory Visit: Payer: Self-pay | Admitting: Infectious Diseases

## 2012-06-24 ENCOUNTER — Encounter: Payer: Self-pay | Admitting: Internal Medicine

## 2012-06-24 ENCOUNTER — Ambulatory Visit
Admission: RE | Admit: 2012-06-24 | Discharge: 2012-06-24 | Disposition: A | Discharge status: Home / Self Care | Payer: No Typology Code available for payment source | Source: Ambulatory Visit | Attending: Infectious Diseases | Admitting: Infectious Diseases

## 2012-06-24 VITALS — BP 109/71 | HR 88 | Temp 98.6°F | Wt 132.0 lb

## 2012-06-24 DIAGNOSIS — Z21 Asymptomatic human immunodeficiency virus [HIV] infection status: Secondary | ICD-10-CM

## 2012-06-24 DIAGNOSIS — B20 Human immunodeficiency virus [HIV] disease: Secondary | ICD-10-CM

## 2012-06-24 DIAGNOSIS — A158 Other respiratory tuberculosis: Secondary | ICD-10-CM

## 2012-06-24 DIAGNOSIS — B009 Herpesviral infection, unspecified: Secondary | ICD-10-CM

## 2012-06-24 DIAGNOSIS — A150 Tuberculosis of lung: Secondary | ICD-10-CM

## 2012-06-24 LAB — COMPLETE METABOLIC PANEL WITH GFR
ALT: 14 U/L (ref 0–35)
AST: 22 U/L (ref 0–37)
Albumin: 4.1 g/dL (ref 3.5–5.2)
Alkaline Phosphatase: 74 U/L (ref 39–117)
BUN: 9 mg/dL (ref 6–23)
CO2: 25 mEq/L (ref 19–32)
Calcium: 9.6 mg/dL (ref 8.4–10.5)
Chloride: 106 mEq/L (ref 96–112)
Creat: 0.78 mg/dL (ref 0.50–1.10)
GFR, Est African American: >89 mL/min
GFR, Est Non African American: >89 mL/min
Glucose, Bld: 69 mg/dL — ABNORMAL LOW (ref 70–99)
Potassium: 3.7 mEq/L (ref 3.5–5.3)
Sodium: 137 mEq/L (ref 135–145)
Total Bilirubin: 0.3 mg/dL (ref 0.3–1.2)
Total Protein: 8.1 g/dL (ref 6.0–8.3)

## 2012-06-24 MED ORDER — VALACYCLOVIR HCL 1 G PO TABS: 1000.0000 mg | ORAL_TABLET | Freq: Three times a day (TID) | ORAL | Status: DC

## 2012-06-25 LAB — CBC WITH DIFFERENTIAL/PLATELET
Basophils Absolute: 0 10*3/uL (ref 0.0–0.1)
Basophils Relative: 1 % (ref 0–1)
Eosinophils Absolute: 0.2 10*3/uL (ref 0.0–0.7)
Eosinophils Relative: 6 % — ABNORMAL HIGH (ref 0–5)
HCT: 27.3 % — ABNORMAL LOW (ref 36.0–46.0)
Hemoglobin: 9.1 g/dL — ABNORMAL LOW (ref 12.0–15.0)
Lymphocytes Relative: 49 % — ABNORMAL HIGH (ref 12–46)
Lymphs Abs: 1.5 10*3/uL (ref 0.7–4.0)
MCH: 26.2 pg (ref 26.0–34.0)
MCHC: 33.3 g/dL (ref 30.0–36.0)
MCV: 78.7 fL (ref 78.0–100.0)
Monocytes Absolute: 0.4 10*3/uL (ref 0.1–1.0)
Monocytes Relative: 13 % — ABNORMAL HIGH (ref 3–12)
Neutro Abs: 0.9 10*3/uL — ABNORMAL LOW (ref 1.7–7.7)
Neutrophils Relative %: 31 % — ABNORMAL LOW (ref 43–77)
Platelets: 217 10*3/uL (ref 150–400)
RBC: 3.47 MIL/uL — ABNORMAL LOW (ref 3.87–5.11)
RDW: 17.2 % — ABNORMAL HIGH (ref 11.5–15.5)
WBC: 3 10*3/uL — ABNORMAL LOW (ref 4.0–10.5)

## 2012-06-25 LAB — RPR

## 2012-06-25 LAB — PATHOLOGIST SMEAR REVIEW

## 2012-06-25 NOTE — Progress Notes (Signed)
HIV CLINIC VISIT  RFV: routine visit  Subjective:    Patient ID: Kelsey Rodriguez, female    DOB: 23-Oct-1991, 21 y.o.   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 CD 4 count of 210(13%)/VL <20 in June 2013, after 2 months of ART.she is taking her medications routinely, doing well, not missing doses Her lower extremity swelling and pain are now improved. She has good appetite. She denies chest pain or shortness of breath or cough. No longer has nightsweats or chills or fevers. She does have rash over the last few weeks on the bottom of her feet, these bullous lesions appear, pop and then become a dark scar, she reports that they are pruritic in nature, not painful. No purulent drainage. Current Outpatient Prescriptions on File Prior to Visit  Medication Sig Dispense Refill  . azithromycin (ZITHROMAX) 600 MG tablet Take 1,200 mg by mouth every 7 (seven) days.      . dapsone 100 MG tablet Take 1 tablet (100 mg total) by mouth daily.  30 tablet  0  . emtricitabine-tenofovir (TRUVADA) 200-300 MG per tablet Take 1 tablet by mouth daily.      [name] ENSURE (ENSURE) Take 237 mLs by mouth 2 (two) times daily between meals.      [name] ethambutol (MYAMBUTOL) 100 MG tablet Take 800 mg by mouth daily.      [name] HYDROcodone-acetaminophen (NORCO) 5-325 MG per tablet Take 1 tablet by mouth every 8 (eight) hours as needed.  60 tablet  0  . isoniazid (NYDRAZID) 300 MG tablet Take 300 mg by mouth daily.      [name] omeprazole (PRILOSEC) 40 MG capsule Take 1 capsule (40 mg total) by mouth daily.  30 capsule  3  . pyrazinamide 500 MG tablet Take 1,000 mg by mouth daily.      [name] pyridOXINE (VITAMIN B-6) 25 MG tablet Take 25 mg by mouth daily.      . raltegravir (ISENTRESS) 400 MG tablet Take 400 mg by mouth 2 (two) times daily.      . rifabutin (MYCOBUTIN) 150 MG capsule Take 300 mg by mouth daily.       Active Ambulatory  Problems    Diagnosis Date Noted  . Chest pain 03/11/2012  . Dysphagia 03/11/2012  . 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 Anemia of chronic disease 03/11/2012  . Reflux esophagitis 03/11/2012  . 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 HIV (human immunodeficiency virus infection) 03/16/2012  . Arthralgia 05/20/2012  . 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 Vaginal Discharge 05/30/2012   Resolved Ambulatory Problems    Diagnosis Date Noted  . No Resolved Ambulatory Problems   Past Medical History  Diagnosis Date  . Tuberculosis     Review of Systems  Constitutional: Negative for fever, chills, diaphoresis, activity change, appetite change, fatigue and unexpected weight change.  HENT: Negative for congestion, sore throat, rhinorrhea, sneezing, trouble swallowing and sinus pressure.  Eyes: Negative for photophobia and visual disturbance.  Respiratory: Negative for cough, chest tightness, shortness of breath, wheezing and stridor.  Cardiovascular: Negative for chest pain, palpitations and leg swelling.  Gastrointestinal: Negative for nausea, vomiting, abdominal pain, diarrhea, constipation, blood in stool, abdominal distention and anal bleeding.  Genitourinary: Negative for dysuria, hematuria, flank pain and difficulty urinating.  Musculoskeletal: Negative for myalgias, back pain, joint swelling, arthralgias and gait problem.  Skin: rash on foot bilaterally  Neurological: Negative for dizziness, tremors, weakness and light-headedness.  Hematological: Negative for adenopathy. Does not bruise/bleed easily.  Psychiatric/Behavioral: Negative for behavioral problems, confusion, sleep disturbance, dysphoric mood, decreased concentration and agitation.       Objective:   Physical Exam BP 109/71  Pulse 88  Temp 98.6 F (37 C) (Oral)  Wt 132 lb (59.875 kg) Physical Exam  Constitutional:  oriented to person, place, and time.  well-developed and well-nourished. No  distress.  HENT: Remy/AT, PERRLA, no scleral icterus Mouth/Throat: Oropharynx is clear and moist. No oropharyngeal exudate.  Cardiovascular: Normal rate, regular rhythm and normal heart sounds. Exam reveals no gallop and no friction rub. No murmur heard.  Pulmonary/Chest: Effort normal and breath sounds normal. No respiratory distress.no wheezes.  Abdominal: Soft. Bowel sounds are normal. He exhibits no distension. There is no tenderness.  Lymphadenopathy: no cervical or axillary adenopathy.  Neurological:  alert and oriented to person, place, and time.  Skin: bilaterally plantar aspect of feet have hyperpigmented scarring, a few raised bullous- fluid filled lesion, cloudy in appearance. Non tender. No erythema. Psychiatric: He has a normal mood and affect. His behavior is normal.   Labs: CBC    Component Value Date/Time   WBC 3.0* 06/24/2012 1149   RBC 3.47* 06/24/2012 1149   HGB 9.1* 06/24/2012 1149   HCT 27.3* 06/24/2012 1149   PLT 217 06/24/2012 1149   MCV 78.7 06/24/2012 1149   MCH 26.2 06/24/2012 1149   MCHC 33.3 06/24/2012 1149   RDW 17.2* 06/24/2012 1149   LYMPHSABS 1.5 06/24/2012 1149   MONOABS 0.4 06/24/2012 1149   EOSABS 0.2 06/24/2012 1149   BASOSABS 0.0 06/24/2012 1149    CMP     Component Value Date/Time   NA 137 06/24/2012 1149   K 3.7 06/24/2012 1149   CL 106 06/24/2012 1149   CO2 25 06/24/2012 1149   GLUCOSE 69* 06/24/2012 1149   BUN 9 06/24/2012 1149   CREATININE 0.78 06/24/2012 1149   CREATININE 0.86 04/12/2012 0540   CALCIUM 9.6 06/24/2012 1149   PROT 8.1 06/24/2012 1149   ALBUMIN 4.1 06/24/2012 1149   AST 22 06/24/2012 1149   ALT 14 06/24/2012 1149   ALKPHOS 74 06/24/2012 1149   BILITOT 0.3 06/24/2012 1149   GFRNONAA >90 04/12/2012 0540   GFRAA >90 04/12/2012 0540          Assessment & Plan:  Hiv= continue with ral/truvada. Check CD 4 count at next visit. She maybe able to stop dapsone at next visit.  TB= continue on current meds as dictated by health dept. Will check  cbc and cmp for side effects.   Neutropenia = her anc has been trending downward over the last few months. anc of 1000 today, last month 1.4. I have asked [facility] to check CBC next week, may need to look over meds as cause of neutropenia  Rash on foot = a bit unusual, will check RPR. Possibly tinea. Will do empiric treatment to see if it improves  Herpes simplex proph = continue with valtrex 1gm daily  rtc in 3 months

## 2012-06-27 LAB — WOUND CULTURE
Gram Stain: NONE SEEN
Organism ID, Bacteria: NO GROWTH

## 2012-06-29 ENCOUNTER — Other Ambulatory Visit: Payer: Self-pay | Admitting: Internal Medicine

## 2012-06-29 DIAGNOSIS — B353 Tinea pedis: Secondary | ICD-10-CM

## 2012-06-29 MED ORDER — TERBINAFINE HCL 1 % EX CREA: TOPICAL_CREAM | Freq: Two times a day (BID) | CUTANEOUS | Status: DC

## 2012-07-09 ENCOUNTER — Telehealth: Payer: Self-pay | Admitting: *Deleted

## 2012-07-09 NOTE — Telephone Encounter (Signed)
Pt requesting "return-to-work" letter from Dr. Drue Second.  Pt requesting a phone call [phone] when the letter is ready.  She will pick it up.

## 2012-07-13 ENCOUNTER — Telehealth: Payer: Self-pay | Admitting: *Deleted

## 2012-07-13 ENCOUNTER — Encounter: Payer: Self-pay | Admitting: *Deleted

## 2012-07-13 NOTE — Telephone Encounter (Signed)
Called patient to advise her that the return to work letter she requested to work is ready for pick up. She advised she will be here to get it this afternoon.

## 2012-08-03 ENCOUNTER — Encounter: Payer: Self-pay | Admitting: Internal Medicine

## 2012-08-11 NOTE — Progress Notes (Signed)
Patient ID: Kelsey Rodriguez, female   DOB: May 19, 1991, 21 y.o.   MRN: [id number]  Pt was seen by her PCP (Dr. Clyda Greener) on 06/29/12. Pt can now be referred to dermatologist.  Pt has appointment with [facility] [phone]) on Monday, August 23, 2012 @ 8:45am to see Dr. Stefanie Libel. Have called pt and sent letter in the mail as a reminder. Tacey Heap RN

## 2012-09-14 ENCOUNTER — Ambulatory Visit: Payer: Self-pay

## 2012-09-14 ENCOUNTER — Other Ambulatory Visit: Payer: Medicaid Other

## 2012-09-14 ENCOUNTER — Other Ambulatory Visit: Payer: Self-pay

## 2012-09-22 NOTE — Progress Notes (Signed)
HIV CLINIC NOTE   RFV : having lower extremity pain Subjective:    Patient ID: Kelsey Rodriguez, female    DOB: [birth year], 21 y.o.   MRN: [id number]  HPI Kelsey Rodriguez is a pleasant [name] with newly diagnosed HIV in setting of pulmonary/extrapulmomary TB, She continues to be on 4 drug regimen and takes as instructed. CD 4 count of 70, up from 20; VL 179 down from 28,149 while on treatment for 3 wks. She continues on OI proph with bactrim and azithro, She is overall doing well with exception to lower extremity swelling which she feels causes significant discomfort. Current Outpatient Prescriptions on File Prior to Visit  Medication Sig Dispense Refill  . azithromycin (ZITHROMAX) 600 MG tablet Take 1,200 mg by mouth every 7 (seven) days.      [name] emtricitabine-tenofovir (TRUVADA) 200-300 MG per tablet Take 1 tablet by mouth daily.      [name] ENSURE (ENSURE) Take 237 mLs by mouth 2 (two) times daily between meals.      [name] ethambutol (MYAMBUTOL) 100 MG tablet Take 800 mg by mouth daily.      [name] isoniazid (NYDRAZID) 300 MG tablet Take 300 mg by mouth daily.      . raltegravir (ISENTRESS) 400 MG tablet Take 400 mg by mouth 2 (two) times daily.      . rifabutin (MYCOBUTIN) 150 MG capsule Take 300 mg by mouth daily.      [name] omeprazole (PRILOSEC) 40 MG capsule Take 1 capsule (40 mg total) by mouth daily.  30 capsule  3  . pyrazinamide 500 MG tablet Take 1,000 mg by mouth daily.      [name] pyridOXINE (VITAMIN B-6) 25 MG tablet Take 25 mg by mouth daily.       Active Ambulatory Problems    Diagnosis Date Noted  . Chest pain 03/11/2012  . Dysphagia 03/11/2012  . 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 Anemia of chronic disease 03/11/2012  . Reflux esophagitis 03/11/2012  . 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 HIV (human immunodeficiency virus infection) 03/16/2012  . Arthralgia 05/20/2012  . 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 Vaginal Discharge 05/30/2012   Resolved Ambulatory Problems    Diagnosis  Date Noted  . No Resolved Ambulatory Problems   Past Medical History  Diagnosis Date  . Tuberculosis       Review of Systems     Objective:   Physical Exam Temp 98.2 F (36.8 C) (Oral)  Ht 5\' 5"  (1.651 m)  Wt 128 lb (58.06 kg)  BMI 21.30 kg/m2  LMP 03/29/2012 Physical Exam  Constitutional:  oriented to person, place, and time.  appears well-developed and well-nourished. No distress.  HENT:  Mouth/Throat: Oropharynx is clear and moist. No oropharyngeal exudate.  Cardiovascular: Normal rate, regular rhythm and normal heart sounds. Exam reveals no gallop and no friction rub.  No murmur heard.  Pulmonary/Chest: Effort normal and breath sounds normal. No respiratory distress. He has no wheezes.  Abdominal: Soft. Bowel sounds are normal. He exhibits no distension. There is no tenderness.  Lymphadenopathy:  no cervical adenopathy.  Ext = swollen bilateral feel noted to having edema up to ankle Neurological:  alert and oriented to person, place, and time.  Skin: Skin is warm and dry. No rash noted. No erythema.       Assessment & Plan:  Lower extremity swelling/ankle effusion = likely due to TB meds. Will do a trialz of ibuprofen or naproxyn to see if that helps the patient.  For pain can, give hydrocone x 1.  Pulmonary/extrapulmonary TB= managed by [facility]  Will have her call back to see how she is doing.

## 2012-09-28 ENCOUNTER — Other Ambulatory Visit (INDEPENDENT_AMBULATORY_CARE_PROVIDER_SITE_OTHER): Payer: Medicaid Other

## 2012-09-28 ENCOUNTER — Ambulatory Visit: Payer: Medicaid Other | Admitting: Internal Medicine

## 2012-09-28 DIAGNOSIS — B20 Human immunodeficiency virus [HIV] disease: Secondary | ICD-10-CM

## 2012-09-28 LAB — CBC WITH DIFFERENTIAL/PLATELET
Basophils Absolute: 0 10*3/uL (ref 0.0–0.1)
Basophils Relative: 1 % (ref 0–1)
Eosinophils Absolute: 0.2 10*3/uL (ref 0.0–0.7)
Eosinophils Relative: 5 % (ref 0–5)
HCT: 29.5 % — ABNORMAL LOW (ref 36.0–46.0)
Hemoglobin: 9.4 g/dL — ABNORMAL LOW (ref 12.0–15.0)
Lymphocytes Relative: 52 % — ABNORMAL HIGH (ref 12–46)
Lymphs Abs: 2 10*3/uL (ref 0.7–4.0)
MCH: 24.9 pg — ABNORMAL LOW (ref 26.0–34.0)
MCHC: 31.9 g/dL (ref 30.0–36.0)
MCV: 78 fL (ref 78.0–100.0)
Monocytes Absolute: 0.5 10*3/uL (ref 0.1–1.0)
Monocytes Relative: 14 % — ABNORMAL HIGH (ref 3–12)
Neutro Abs: 1.1 10*3/uL — ABNORMAL LOW (ref 1.7–7.7)
Neutrophils Relative %: 28 % — ABNORMAL LOW (ref 43–77)
Platelets: 167 10*3/uL (ref 150–400)
RBC: 3.78 MIL/uL — ABNORMAL LOW (ref 3.87–5.11)
RDW: 18.2 % — ABNORMAL HIGH (ref 11.5–15.5)
WBC: 3.9 10*3/uL — ABNORMAL LOW (ref 4.0–10.5)

## 2012-09-29 LAB — COMPLETE METABOLIC PANEL WITH GFR
ALT: 12 U/L (ref 0–35)
AST: 18 U/L (ref 0–37)
Albumin: 4.2 g/dL (ref 3.5–5.2)
Alkaline Phosphatase: 68 U/L (ref 39–117)
BUN: 10 mg/dL (ref 6–23)
CO2: 24 mEq/L (ref 19–32)
Calcium: 9.1 mg/dL (ref 8.4–10.5)
Chloride: 105 mEq/L (ref 96–112)
Creat: 0.67 mg/dL (ref 0.50–1.10)
GFR, Est African American: >89 mL/min
GFR, Est Non African American: >89 mL/min
Glucose, Bld: 80 mg/dL (ref 70–99)
Potassium: 4.1 mEq/L (ref 3.5–5.3)
Sodium: 136 mEq/L (ref 135–145)
Total Bilirubin: 0.2 mg/dL — ABNORMAL LOW (ref 0.3–1.2)
Total Protein: 7.8 g/dL (ref 6.0–8.3)

## 2012-09-29 LAB — HIV-1 RNA QUANT-NO REFLEX-BLD
HIV 1 RNA Quant: 26955 copies/mL — ABNORMAL HIGH (ref <20)
HIV-1 RNA Quant, Log: 4.43 {Log} — ABNORMAL HIGH (ref <1.30)

## 2012-09-29 LAB — T-HELPER CELL (CD4) - (RCID CLINIC ONLY)
CD4 % Helper T Cell: 10 % — ABNORMAL LOW (ref 33–55)
CD4 T Cell Abs: 210 uL — ABNORMAL LOW (ref 400–2700)

## 2012-10-04 ENCOUNTER — Other Ambulatory Visit: Payer: Self-pay | Admitting: *Deleted

## 2012-10-04 DIAGNOSIS — B009 Herpesviral infection, unspecified: Secondary | ICD-10-CM

## 2012-10-04 DIAGNOSIS — B20 Human immunodeficiency virus [HIV] disease: Secondary | ICD-10-CM

## 2012-10-04 MED ORDER — RALTEGRAVIR POTASSIUM 400 MG PO TABS: 400.0000 mg | ORAL_TABLET | Freq: Two times a day (BID) | ORAL | Status: DC

## 2012-10-04 MED ORDER — VALACYCLOVIR HCL 1 G PO TABS: 1000.0000 mg | ORAL_TABLET | Freq: Three times a day (TID) | ORAL | Status: DC

## 2012-10-04 MED ORDER — EMTRICITABINE-TENOFOVIR DF 200-300 MG PO TABS: 1.0000 | ORAL_TABLET | Freq: Every day | ORAL | Status: DC

## 2012-10-06 ENCOUNTER — Telehealth: Payer: Self-pay | Admitting: *Deleted

## 2012-10-06 DIAGNOSIS — A6000 Herpesviral infection of urogenital system, unspecified: Secondary | ICD-10-CM

## 2012-10-06 NOTE — Telephone Encounter (Signed)
Can continue on suppressive dose

## 2012-10-06 NOTE — Telephone Encounter (Signed)
Kelsey Rodriguez, pharmacist from [place], wanted to know whether Dr. Drue Second wanted to pt to continue the Valacyclovir 500 mg daily as suppressive therapy after completing the valacyclovir 1000 mg 21-day course.  Dr. Drue Second please advise of refill for 500 mg tablets.

## 2012-10-08 ENCOUNTER — Ambulatory Visit
Admission: RE | Admit: 2012-10-08 | Discharge: 2012-10-08 | Disposition: A | Discharge status: Home / Self Care | Payer: No Typology Code available for payment source | Source: Ambulatory Visit | Attending: Infectious Diseases | Admitting: Infectious Diseases

## 2012-10-08 ENCOUNTER — Other Ambulatory Visit: Payer: Self-pay | Admitting: Infectious Diseases

## 2012-10-08 DIAGNOSIS — A158 Other respiratory tuberculosis: Secondary | ICD-10-CM

## 2012-10-12 ENCOUNTER — Encounter: Payer: Self-pay | Admitting: Internal Medicine

## 2012-10-12 ENCOUNTER — Ambulatory Visit (INDEPENDENT_AMBULATORY_CARE_PROVIDER_SITE_OTHER): Payer: Medicaid Other | Admitting: Internal Medicine

## 2012-10-12 VITALS — BP 115/69 | HR 89 | Temp 98.8°F | Wt 140.0 lb

## 2012-10-12 DIAGNOSIS — Z23 Encounter for immunization: Secondary | ICD-10-CM

## 2012-10-12 DIAGNOSIS — B373 Candidiasis of vulva and vagina: Secondary | ICD-10-CM

## 2012-10-12 DIAGNOSIS — Z21 Asymptomatic human immunodeficiency virus [HIV] infection status: Secondary | ICD-10-CM

## 2012-10-12 DIAGNOSIS — B20 Human immunodeficiency virus [HIV] disease: Secondary | ICD-10-CM

## 2012-10-12 DIAGNOSIS — D649 Anemia, unspecified: Secondary | ICD-10-CM

## 2012-10-12 LAB — CBC
HCT: 31.9 % — ABNORMAL LOW (ref 36.0–46.0)
Hemoglobin: 10.2 g/dL — ABNORMAL LOW (ref 12.0–15.0)
MCH: 24.9 pg — ABNORMAL LOW (ref 26.0–34.0)
MCHC: 32 g/dL (ref 30.0–36.0)
MCV: 77.8 fL — ABNORMAL LOW (ref 78.0–100.0)
Platelets: 209 10*3/uL (ref 150–400)
RBC: 4.1 MIL/uL (ref 3.87–5.11)
RDW: 17.4 % — ABNORMAL HIGH (ref 11.5–15.5)
WBC: 4.3 10*3/uL (ref 4.0–10.5)

## 2012-10-12 LAB — FERRITIN: Ferritin: 9 ng/mL — ABNORMAL LOW (ref 10–291)

## 2012-10-12 LAB — IRON AND TIBC
%SAT: 6 % — ABNORMAL LOW (ref 20–55)
Iron: 27 ug/dL — ABNORMAL LOW (ref 42–145)
TIBC: 477 ug/dL — ABNORMAL HIGH (ref 250–470)
UIBC: 450 ug/dL — ABNORMAL HIGH (ref 125–400)

## 2012-10-12 MED ORDER — VALACYCLOVIR HCL 1 G PO TABS: 1000.0000 mg | ORAL_TABLET | Freq: Every day | ORAL | Status: DC

## 2012-10-12 MED ORDER — FLUCONAZOLE 150 MG PO TABS: 150.0000 mg | ORAL_TABLET | Freq: Once | ORAL | Status: DC

## 2012-10-12 NOTE — Addendum Note (Signed)
Addended by: Jennet Maduro D on: 10/12/2012 11:58 AM   Modules accepted: Orders

## 2012-10-12 NOTE — Progress Notes (Signed)
HIV CLINIC NOTE  RFV: routine follow up Subjective:    Patient ID: Kelsey Rodriguez, female    DOB: July 15, 1991, 21 y.o.   MRN: [id number]  HPI 21 yo Female, with HIV/ TB, CD 210/ VL 26,955, supposed to be on raltegravir and truvada. She was previously virally suppressed 3 months ago with VL<20. She states that she is having difficulty remembering to take her 2nd dose of raltegravir. She reports running out of truvada, 2 days ago, and having 15 addn tablets of raltegravir, for which she has been taking as monotherapy since she ran out of truvada. She states the pharmacy said there is a delay in her refills for unclear reasons. She reports being fatigue and forgetful at the end of the day as the reasons why she doesn't take her 2nd dose of meds. She is wondering if her fatigue is related to her anemia  The patient reports finishing up 6 month course of therapy for TB last Tuesday.   Social: he has been working as a Barrister's clerk per day, 5 x per week. She also comes home after work to [employer] and care for her young nephews, 2 and 5 yrs old. Her mother works the 2nd shift.   Review of Systems 10 point ROS is negative except for occassional abdominal discomfort when her bladder is full and she gets same sensation with exercise intermittently. Denies any abdominal pain with eating or defecation. She also reports having vaginal yeast infection    Objective:   Physical Exam BP 115/69  Pulse 89  Temp 98.8 F (37.1 C) (Oral)  Wt 140 lb (63.504 kg)  LMP 09/20/2012 Constitutional: oriented to person, place, and time.  appears well-developed and well-nourished. No distress.  HENT:  Mouth/Throat: Oropharynx is clear and moist. No oropharyngeal exudate. No thrush Cardiovascular: Normal rate, regular rhythm and normal heart sounds. Exam reveals no gallop and no friction rub.  No murmur heard.  Pulmonary/Chest: Effort normal and breath sounds normal. No respiratory distress.  no wheezes.  Abdominal: Soft. Bowel  sounds are normal.  exhibits no distension. There is no tenderness. No HSM Lymphadenopathy:  no cervical adenopathy.  Skin: Skin is warm and dry. No rash noted. No erythema.      Assessment & Plan:   hiv = poorly controlled with VL in 27,000. Concern for poor adherence, and II resistance. Due to taking only once a day, raltegravir. Will need to discuss if she needs further TB treatment. She would be a great candidate for once a day PI based regimen but concern for drug interaction with TB meds. Will check Viral load, genotype, II genotype. I have asked her to stop taking raltegravir until we decide on her new regimen, and VL/genotype return  TB = Finished 6 mo of TB treatment four drug therapy last week. Will contact Dr. 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 If 6 month therapy is sufficient, then can do truvada, darunavir, ritonavir daily dosing as her new regimen.  Vaginal candidiasis = will send RX for fluconazole for yeast infection  Anemia= will check iron studies as possible etiology of fatigue. Her CBC is not considerably changed from 4 months ago.  Fatigue = anemia work up but I suspect that she is significantly deconditioned and starting back at work in excess to what she can tolerate. I asked her to consider cutting  back work for a few weeks to doing 4-5 hr only 4 days a week.  hiv adherence = discussed importance of not missing doses, will give pill box and keychain pill box.THP counselor ? Maybe needed again  Health maintenance= to get flu vaccination today  hsv proph = continue on valtrex.  rtc in 1 month / new pharmacy is [facility] [address]

## 2012-10-13 LAB — HIV-1 RNA ULTRAQUANT REFLEX TO GENTYP+
HIV 1 RNA Quant: 564 copies/mL — ABNORMAL HIGH (ref <20)
HIV-1 RNA Quant, Log: 2.75 {Log} — ABNORMAL HIGH (ref <1.30)

## 2012-10-14 ENCOUNTER — Telehealth: Payer: Self-pay | Admitting: Internal Medicine

## 2012-10-14 ENCOUNTER — Other Ambulatory Visit: Payer: Self-pay | Admitting: Internal Medicine

## 2012-10-14 DIAGNOSIS — D649 Anemia, unspecified: Secondary | ICD-10-CM

## 2012-10-14 DIAGNOSIS — B20 Human immunodeficiency virus [HIV] disease: Secondary | ICD-10-CM

## 2012-10-14 MED ORDER — RITONAVIR 100 MG PO TABS: 100.0000 mg | ORAL_TABLET | Freq: Every day | ORAL | Status: DC

## 2012-10-14 MED ORDER — DARUNAVIR ETHANOLATE 800 MG PO TABS: 800.0000 mg | ORAL_TABLET | Freq: Every day | ORAL | Status: DC

## 2012-10-14 MED ORDER — FERROUS GLUCONATE 324 (38 FE) MG PO TABS: 324.0000 mg | ORAL_TABLET | Freq: Two times a day (BID) | ORAL | Status: DC

## 2012-10-14 NOTE — Telephone Encounter (Signed)
I have mentioned to Kelsey Rodriguez that we are changing her HIV regimen. I am concerned that she may not understand everything over the phone. i have asked her to come to clinic with her meds so that we can explain her HIV regimen and other meds. To fill into pill box.  New HIV regimen: truvada daily darunavir daily Ritonavir daily  HSV proph: Valtrex 1gm daily  Vaginal candidiasis Fluconazole 150mg  once (2 refills)  Anemia: Iron 325mg  daily- BID if possible  Unclear is she is still on prilosec  Will have her meet as RN appt to go over medications and adherence

## 2012-10-20 ENCOUNTER — Telehealth: Payer: Self-pay | Admitting: *Deleted

## 2012-10-20 NOTE — Telephone Encounter (Signed)
Message left for pt on cell phone.  Medication Management, Dr Drue Second requesting pt to come for medication educaton re: new HIV regimen.  Scheduled visit with [facility] Pharmacist for 10/26/12 @ 10 AM.  Appt information left on pt's cell phone.

## 2012-10-26 ENCOUNTER — Ambulatory Visit (INDEPENDENT_AMBULATORY_CARE_PROVIDER_SITE_OTHER): Payer: Medicaid Other | Admitting: Internal Medicine

## 2012-10-26 ENCOUNTER — Telehealth: Payer: Self-pay | Admitting: Licensed Clinical Social Worker

## 2012-10-26 DIAGNOSIS — Z21 Asymptomatic human immunodeficiency virus [HIV] infection status: Secondary | ICD-10-CM

## 2012-10-26 DIAGNOSIS — B20 Human immunodeficiency virus [HIV] disease: Secondary | ICD-10-CM

## 2012-10-26 NOTE — Progress Notes (Signed)
HPI: Kelsey Rodriguez is a 21 y.o. female with HIV who is here today for counseling on her new regimen.  Allergies: No Known Allergies  Past Medical History: Past Medical History  Diagnosis Date  . Tuberculosis   . HIV (human immunodeficiency virus infection) 02/2012    Social History: History   Social History  . Marital Status: Single    Spouse Name: N/A    Number of Children: N/A  . Years of Education: N/A   Social History Main Topics  . Smoking status: Never Smoker   . Smokeless tobacco: Never Used  . Alcohol Use: No  . Drug Use: No  . Sexually Active: Not Currently -- Female partner(s)     Comment: pt. given condoms   Other Topics Concern  . Not on file   Social History Narrative  . No narrative on file    Home Medications: Iron sulfate 325mg  qday Truvada 1 tab qday Duranavir 800mg  qday Ritonavir 100mg  qday  Current Regimen: Truvada + Darunavir/r  Labs: HIV 1 RNA Quant (copies/mL)  Date Value  10/12/2012 564*  09/28/2012 16109*  06/10/2012 <20      CD4 T Cell Abs (cmm)  Date Value  09/28/2012 210*  06/10/2012 210*  05/20/2012 130*     Hep B S Ab (no units)  Date Value  03/13/2012 NEGATIVE      Hepatitis B Surface Ag (no units)  Date Value  03/13/2012 NEGATIVE      HCV Ab (no units)  Date Value  03/13/2012 NEGATIVE     CrCl: The CrCl is unknown because both a height and weight (above a minimum accepted value) are required for this calculation.  Assessment: This 21 yo female was recently seen in clinic for HIV follow up. She wasn't compliance with here Isentress by only taking it once a day on occasion. Her VL became unsuppressed. Dr. Drue Second changed her to Travada/darunavir/r on 10/12/12. She is still complaining of fatigue likely related to her iron deficiency anemia. She has started to take iron supplement once daily. Due to her fatigue, she would like to take some time off of work until December to concentrate on her health and she ask for note  work.   Recommendations: 1. Educate on new regimen including side effects and adherence 2. Recommend she increase her iron supplement to 3x/day 3. Will get Dr. Drue Second to send her a note to be excuse from work.   Clide Cliff, PharmD Clinical Infectious Disease Pharmacist [facility] 10/26/2012, 10:40 AM

## 2012-10-26 NOTE — Telephone Encounter (Signed)
Patient saw the pharmacist today and mentioned that she is really fatigued and feels bad from her medications. She wanted to stay out of work until her appointment on 11/18/2012.  She needs a note stating this faxed to [phone].

## 2012-10-27 ENCOUNTER — Telehealth: Payer: Self-pay | Admitting: *Deleted

## 2012-10-27 LAB — HIV-1 INTEGRASE GENOTYPE

## 2012-10-27 NOTE — Telephone Encounter (Signed)
Please have a work exemption note for belta until January. thx

## 2012-10-27 NOTE — Telephone Encounter (Signed)
Patient called and asked if doctor had done letter for her to be out of work and she advised she would like to make the leave until January if at all possible as she is having a hard time with stamina and needs the time to build herself back up. Advised the patient will forward her request to Dr Drue Second and get back to her asap.

## 2012-11-01 ENCOUNTER — Encounter: Payer: Self-pay | Admitting: *Deleted

## 2012-11-03 ENCOUNTER — Telehealth: Payer: Self-pay | Admitting: *Deleted

## 2012-11-03 NOTE — Telephone Encounter (Signed)
Called patient to advise her that the letter she requested was faxed to her employer at [phone]. Also that she can pick up a hard copy at the front desk at her next visit.

## 2012-11-04 ENCOUNTER — Telehealth: Payer: Self-pay | Admitting: Licensed Clinical Social Worker

## 2012-11-04 NOTE — Telephone Encounter (Signed)
Patient started having diarrhea last night and she has had 3 episodes since. She has pain right before a diarrheal episode. She took imodium last night and it did not help. Patient would like something called in to [facility] pharmacy. She denies nausea and vomiting.

## 2012-11-04 NOTE — Telephone Encounter (Signed)
Can you have her come into clinic to give [place] stool sample to check for stool culture and c.difficile. Can do pepto bismal for now

## 2012-11-13 ENCOUNTER — Encounter (HOSPITAL_COMMUNITY): Payer: Self-pay | Admitting: Emergency Medicine

## 2012-11-13 ENCOUNTER — Emergency Department (HOSPITAL_COMMUNITY)
Admission: EM | Admit: 2012-11-13 | Discharge: 2012-11-13 | Disposition: A | Discharge status: Home / Self Care | Payer: Medicaid Other | Attending: Emergency Medicine | Admitting: Emergency Medicine

## 2012-11-13 DIAGNOSIS — Y939 Activity, unspecified: Secondary | ICD-10-CM | POA: Insufficient documentation

## 2012-11-13 DIAGNOSIS — W268XXA Contact with other sharp object(s), not elsewhere classified, initial encounter: Secondary | ICD-10-CM | POA: Insufficient documentation

## 2012-11-13 DIAGNOSIS — S81009A Unspecified open wound, unspecified knee, initial encounter: Secondary | ICD-10-CM | POA: Insufficient documentation

## 2012-11-13 DIAGNOSIS — S91019A Laceration without foreign body, unspecified ankle, initial encounter: Secondary | ICD-10-CM

## 2012-11-13 DIAGNOSIS — S91009A Unspecified open wound, unspecified ankle, initial encounter: Secondary | ICD-10-CM | POA: Insufficient documentation

## 2012-11-13 DIAGNOSIS — B20 Human immunodeficiency virus [HIV] disease: Secondary | ICD-10-CM | POA: Insufficient documentation

## 2012-11-13 DIAGNOSIS — A150 Tuberculosis of lung: Secondary | ICD-10-CM | POA: Insufficient documentation

## 2012-11-13 DIAGNOSIS — Y929 Unspecified place or not applicable: Secondary | ICD-10-CM | POA: Insufficient documentation

## 2012-11-13 DIAGNOSIS — Z79899 Other long term (current) drug therapy: Secondary | ICD-10-CM | POA: Insufficient documentation

## 2012-11-13 MED ORDER — TETANUS-DIPHTH-ACELL PERTUSSIS 5-2.5-18.5 LF-MCG/0.5 IM SUSP
0.5000 mL | Freq: Once | INTRAMUSCULAR | Status: AC
  Administered 2012-11-13: 0.5 mL via INTRAMUSCULAR
  Filled 2012-11-13: qty 0.5

## 2012-11-13 NOTE — ED Notes (Signed)
Pt with laceration to right ankle; bleeding controlled; pt sts something fell against foot

## 2012-11-13 NOTE — ED Provider Notes (Signed)
History   This chart was scribed for [name], MD scribed by Magnus Sinning. The patient was seen in room TR06C/TR06C at 13:11    CSN: [id number]  Arrival date & time 11/13/12  1242   None     Chief Complaint  Patient presents with  . Laceration    (Consider location/radiation/quality/duration/timing/severity/associated sxs/prior treatment) HPI Comments: Kelsey Rodriguez is a 21 y.o. female who presents to the Emergency Department complaining of a  laceration with constant moderate pain to her right ankle. She says today her relative accidentally pushed a speaker down and that the edge of the speaker cut her ankle. She says that nothing shattered and no foreign bodies are present in the wound. However, she is unsure of her last tetanus.   Patient is a 21 y.o. female presenting with skin laceration. The history is provided by the patient. No language interpreter was used.  Laceration  The incident occurred less than 1 hour ago. The laceration is located on the right leg. The pain is mild. The pain has been constant since onset. She reports no foreign bodies present. Her tetanus status is unknown.    Past Medical History  Diagnosis Date  . Tuberculosis   . HIV (human immunodeficiency virus infection) 02/2012    Past Surgical History  Procedure Date  . Esophagogastroduodenoscopy 03/11/2012    Procedure: ESOPHAGOGASTRODUODENOSCOPY (EGD);  Surgeon: Hart Carwin, MD;  Location: [facility] ENDOSCOPY;  Service: Endoscopy;  Laterality: N/A;  . Appendectomy ~ 2000  . Lung biopsy 02/2012    History reviewed. No pertinent family history.  History  Substance Use Topics  . Smoking status: Never Smoker   . Smokeless tobacco: Never Used  . Alcohol Use: No    Review of Systems  All other systems reviewed and are negative.    Allergies  Review of patient's allergies indicates no known allergies.  Home Medications   Current Outpatient Rx  Name  Route  Sig  Dispense  Refill  . DARUNAVIR  ETHANOLATE 800 MG PO TABS   Oral   Take 1 tablet (800 mg total) by mouth daily with breakfast.   30 tablet   11   . EMTRICITABINE-TENOFOVIR 200-300 MG PO TABS   Oral   Take 1 tablet by mouth daily.   30 tablet   5   . RITONAVIR 100 MG PO TABS   Oral   Take 1 tablet (100 mg total) by mouth daily.   30 tablet   11   . VALACYCLOVIR HCL 1 G PO TABS   Oral   Take 1 tablet (1,000 mg total) by mouth daily.   30 tablet   11     Suppressive therapy   . ENSURE PO LIQD   Oral   Take 237 mLs by mouth 2 (two) times daily between meals.           BP 115/74  Pulse 83  Temp 98.2 F (36.8 C) (Oral)  Resp 16  SpO2 100%  Physical Exam  Nursing note and vitals reviewed. Constitutional: She is oriented to person, place, and time. She appears well-developed and well-nourished. No distress.  HENT:  Head: Normocephalic and atraumatic.  Eyes: Conjunctivae normal and EOM are normal.  Neck: Neck supple. No tracheal deviation present.  Cardiovascular: Normal rate.   Pulmonary/Chest: Effort normal. No respiratory distress.  Abdominal: She exhibits no distension.  Musculoskeletal: Normal range of motion.       Right posterior ankle there is a c-shaped laceration approximately  2 cm in length.   Neurological: She is alert and oriented to person, place, and time. No sensory deficit.  Skin: Skin is warm and dry.  Psychiatric: She has a normal mood and affect. Her behavior is normal.    ED Course  Procedures (including critical care time) DIAGNOSTIC STUDIES: Oxygen Saturation is 100% on room air, normal by my interpretation.    COORDINATION OF CARE:  LACERATION REPAIR Performed by: [name] Authorized by: [name] Consent: Verbal consent obtained. Risks and benefits: risks, benefits and alternatives were discussed Consent given by: patient Patient identity confirmed: provided demographic data Prepped and Draped in normal sterile fashion Wound explored  Laceration  Location: right ankle  Laceration Length: 2cm  No Foreign Bodies seen or palpated  Anesthesia: local infiltration  Local anesthetic: lidocaine 2% w epinephrine  Anesthetic total: 3 ml  Irrigation method: syringe Amount of cleaning: standard  Skin closure: 4.0 prolene  Number of sutures: 7  Technique: simple interrupted  Patient tolerance: Patient tolerated the procedure well with no immediate complications.   Labs Reviewed - No data to display No results found.   1. Laceration of ankle       MDM  Pt presenting with laceration to right ankle.  Laceration suture with 4.0 prolene.  Pt tolerated procedure well.  Tetanus updated.  Pt instructed to have sutures removed in 14 days.  Discharged with strict return precautions.  Pt agreeable with plan.   I personally performed the services described in this documentation, which was scribed in my presence. The recorded information has been reviewed and is accurate.         [name], MD 11/13/12 1501

## 2012-11-13 NOTE — ED Notes (Signed)
Patient discharged using teach back method, She verbalized an understanding

## 2012-11-13 NOTE — ED Notes (Signed)
Wound irrigated with NS

## 2012-11-15 NOTE — Telephone Encounter (Signed)
Patient is not having diarrhea anymore.

## 2012-11-18 ENCOUNTER — Encounter: Payer: Self-pay | Admitting: Internal Medicine

## 2012-11-18 ENCOUNTER — Ambulatory Visit (INDEPENDENT_AMBULATORY_CARE_PROVIDER_SITE_OTHER): Payer: Medicaid Other | Admitting: Internal Medicine

## 2012-11-18 VITALS — BP 118/74 | HR 86 | Temp 98.4°F | Ht 65.0 in | Wt 138.5 lb

## 2012-11-18 DIAGNOSIS — S91011A Laceration without foreign body, right ankle, initial encounter: Secondary | ICD-10-CM

## 2012-11-18 DIAGNOSIS — S91009A Unspecified open wound, unspecified ankle, initial encounter: Secondary | ICD-10-CM

## 2012-11-18 DIAGNOSIS — S81009A Unspecified open wound, unspecified knee, initial encounter: Secondary | ICD-10-CM

## 2012-11-18 DIAGNOSIS — R52 Pain, unspecified: Secondary | ICD-10-CM

## 2012-11-18 HISTORY — DX: Laceration without foreign body, right ankle, initial encounter: S91.011A

## 2012-11-18 MED ORDER — HYDROCODONE-ACETAMINOPHEN 5-500 MG PO TABS: 1.0000 | ORAL_TABLET | Freq: Three times a day (TID) | ORAL | Status: DC | PRN

## 2012-11-18 NOTE — Progress Notes (Signed)
HIV CLINIC NOTE  RFV: routine follow up Subjective:    Patient ID: Kelsey Rodriguez, female    DOB: 14-Oct-1991, 21 y.o.   MRN: [id number]  HPI Charl is a 21yo F with HIV c/b pulmonary mTB, finished TB treatment, but was noted to have increased VL in the 50,000 in October due to not taking raltegravir consistently. She was previously on ral/truvada during her mTB treatment. In Oct, she was changed to truvada/DRV/r for the past 2-3 months, and most recent CD 4 count is 210/ VL 584, doing great with adherence. Did well since talking to pharmacy for adherence counseling.   This past Saturday, she had to have sutures to right ankle due to speaker falling on her foot and causing laceration. She states that she has pain during ambulation due to injury. She is to have sutures in place for the next 2 weeks.  She states that she has had intermittent pelvic pain. She denies any recent sexual encounters. No diarrhea, or pain with constipation.  Current Outpatient Prescriptions on File Prior to Visit  Medication Sig Dispense Refill  . Darunavir Ethanolate (PREZISTA) 800 MG tablet Take 1 tablet (800 mg total) by mouth daily with breakfast.  30 tablet  11  . emtricitabine-tenofovir (TRUVADA) 200-300 MG per tablet Take 1 tablet by mouth daily.  30 tablet  5  . ritonavir (NORVIR) 100 MG TABS Take 1 tablet (100 mg total) by mouth daily.  30 tablet  11  . valACYclovir (VALTREX) 1000 MG tablet Take 1 tablet (1,000 mg total) by mouth daily.  30 tablet  11  . ENSURE (ENSURE) Take 237 mLs by mouth 2 (two) times daily between meals.       Active Ambulatory Problems    Diagnosis Date Noted  . Chest pain 03/11/2012  . Dysphagia 03/11/2012  . 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 Anemia of chronic disease 03/11/2012  . Reflux esophagitis 03/11/2012  . 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 HIV (human immunodeficiency virus infection) 03/16/2012  . Arthralgia 05/20/2012  . Bullae  05/30/2012  . Vaginal Discharge 05/30/2012   Resolved Ambulatory Problems    Diagnosis Date Noted  . No Resolved Ambulatory Problems   Past Medical History  Diagnosis Date  . Tuberculosis      Review of Systems     Objective:   Physical Exam BP 118/74  Pulse 86  Temp 98.4 F (36.9 C) (Oral)  Ht 5\' 5"  (1.651 m)  Wt 138 lb 8 oz (62.823 kg)  BMI 23.05 kg/m2  LMP 11/14/2012 Physical Exam  Constitutional:  oriented to person, place, and time.  appears well-developed and well-nourished. No distress.  HENT:  Mouth/Throat: Oropharynx is clear and moist. No oropharyngeal exudate.  Cardiovascular: Normal rate, regular rhythm and normal heart sounds. Exam reveals no gallop and no friction rub. No murmur heard.  Pulmonary/Chest: Effort normal and breath sounds normal. No respiratory distress.  no wheezes.  Abdominal: Soft. Bowel sounds are normal.  exhibits no distension. There is no tenderness.  Lymphadenopathy:  no cervical adenopathy.  Skin: Skin is warm and dry. No rash noted. No erythema. Right ankle sutures are c/d/i Ext = swollen right ankle, mildly decreased range of motion.       Assessment & Plan:  hiv = continue with her current regimen: truvada/darunavir/ritonavir. Continue with excellent adherence. Will have her come back for blood work in 4 wks to check cd 4 count and VL ( roughly has been on new regimen for the  past 3-4 months by then)  Pain from recent ankle injury = will do pain rx, vicodin #30 NR  Pelvic pain = will arrange for gynecology appt for pelvic and pap smear  Pulmonary mTB with mediastinal adenopathy=  Finished course of therapy via [facility] in sep/oct  rtc in 4 wk for blood work: return appt in 6 wks.

## 2012-11-19 ENCOUNTER — Telehealth: Payer: Self-pay | Admitting: *Deleted

## 2012-11-19 NOTE — Telephone Encounter (Signed)
Called patient to notify of appt at [facility] for 12/13/12 at 2:00 pm. She is going to call back to get directions. Wendall Mola CMA

## 2012-11-26 ENCOUNTER — Telehealth: Payer: Self-pay

## 2012-11-26 NOTE — Telephone Encounter (Signed)
Patient states she was at the hospital with an ankle injury which required sutures.  She needs sutures removed.

## 2012-11-26 NOTE — Telephone Encounter (Signed)
Today makes two weeks with the sutures.  There is no one in the office today to remove them.  Schedule OV for Monday, December, 16, 2013.    Tomasita Morrow, RN

## 2012-11-29 ENCOUNTER — Encounter: Payer: Self-pay | Admitting: Infectious Diseases

## 2012-11-29 ENCOUNTER — Ambulatory Visit (INDEPENDENT_AMBULATORY_CARE_PROVIDER_SITE_OTHER): Payer: Medicaid Other | Admitting: Infectious Diseases

## 2012-11-29 VITALS — BP 120/74 | HR 77 | Temp 98.7°F | Wt 137.2 lb

## 2012-11-29 DIAGNOSIS — S91011A Laceration without foreign body, right ankle, initial encounter: Secondary | ICD-10-CM

## 2012-11-29 DIAGNOSIS — S91009A Unspecified open wound, unspecified ankle, initial encounter: Secondary | ICD-10-CM

## 2012-11-29 DIAGNOSIS — S81009A Unspecified open wound, unspecified knee, initial encounter: Secondary | ICD-10-CM

## 2012-11-29 DIAGNOSIS — B20 Human immunodeficiency virus [HIV] disease: Secondary | ICD-10-CM

## 2012-11-29 DIAGNOSIS — Z21 Asymptomatic human immunodeficiency virus [HIV] infection status: Secondary | ICD-10-CM

## 2012-11-29 MED ORDER — SULFAMETHOXAZOLE-TRIMETHOPRIM 800-160 MG PO TABS: 1.0000 | ORAL_TABLET | Freq: Two times a day (BID) | ORAL | Status: AC

## 2012-11-29 NOTE — Assessment & Plan Note (Signed)
Had significant drop in VL over 2 weeks. Recheck at her regular f/u. vax and pap are up to date. Offered condoms.

## 2012-11-29 NOTE — Assessment & Plan Note (Addendum)
7 sutures are removed. There is some tenderness. Will give her dressings for her to change at home. Will have her back in 8 days to recheck her wound. Will start her bactrim due to wound d/c. She will call if the wound has more d/c, does not close. Will keep dressed.

## 2012-11-29 NOTE — Progress Notes (Signed)
  Subjective:    Patient ID: Kelsey Rodriguez, female    DOB: Jan 10, 1991, 21 y.o.   MRN: [id number]  HPI 21yo F from [place], in [place] 2 years. Hx HIV+ (dx April 2013) and pulmonary TB (tx April to October 2013). Was on ISN/TR during tx due to drug interactions, had detectable VL. In Oct, changed to TRV/DRVr.  2 weeks ago had laceration to the back of her R ankle. Wound has healed well, would like sutures out.  No problems with ART.   HIV 1 RNA Quant (copies/mL)  Date Value  10/12/2012 564*  09/28/2012 62952*  06/10/2012 <20      CD4 T Cell Abs (cmm)  Date Value  09/28/2012 210*  06/10/2012 210*  05/20/2012 130*     Review of Systems  Constitutional: Negative for appetite change and unexpected weight change.  Gastrointestinal: Negative for diarrhea and constipation.  Genitourinary: Negative for difficulty urinating.  Hematological: Negative for adenopathy.       Objective:   Physical Exam  Constitutional: She appears well-developed and well-nourished.  HENT:  Mouth/Throat: No oropharyngeal exudate.  Eyes: EOM are normal. Pupils are equal, round, and reactive to light.  Neck: Neck supple.  Cardiovascular: Normal rate, regular rhythm and normal heart sounds.   Pulmonary/Chest: Effort normal and breath sounds normal.  Abdominal: Soft. Bowel sounds are normal. There is no tenderness.  Musculoskeletal:       Feet:  Lymphadenopathy:    She has no cervical adenopathy.          Assessment & Plan:

## 2012-11-29 NOTE — Progress Notes (Signed)
Patient ID: Kelsey Rodriguez, female   DOB: 1991/08/28, 21 y.o.   MRN: [id number] Patient seen by pharmacy for adherence counseling.

## 2012-12-07 ENCOUNTER — Ambulatory Visit (INDEPENDENT_AMBULATORY_CARE_PROVIDER_SITE_OTHER): Payer: Medicaid Other | Admitting: Infectious Diseases

## 2012-12-07 ENCOUNTER — Encounter: Payer: Self-pay | Admitting: Infectious Diseases

## 2012-12-07 VITALS — BP 118/78 | HR 79 | Temp 98.1°F | Wt 138.5 lb

## 2012-12-07 DIAGNOSIS — S81809A Unspecified open wound, unspecified lower leg, initial encounter: Secondary | ICD-10-CM

## 2012-12-07 DIAGNOSIS — S91011A Laceration without foreign body, right ankle, initial encounter: Secondary | ICD-10-CM

## 2012-12-07 DIAGNOSIS — S81009A Unspecified open wound, unspecified knee, initial encounter: Secondary | ICD-10-CM

## 2012-12-07 NOTE — Assessment & Plan Note (Signed)
Her wound appears to be healing well. I do not believe her recent fevers and body aches are due to her wound (suspect related to viral URI, explained this to pt). I asked that she call us if she has any redness, swelling, increasing pain, drainage from her wound. Otherwise she will f/u with her regular MD next month as scheduled.

## 2012-12-07 NOTE — Progress Notes (Signed)
  Subjective:    Patient ID: Kelsey Rodriguez, female    DOB: [birth year], 21 y.o.   MRN: [id number]  HPI 21yo F from [place], in [place] 2 years. Hx HIV+ (dx April 2013) and pulmonary TB (tx April to October 2013). Was on ISN/TR during tx due to drug interactions, had detectable VL. In Oct, changed to TRV/DRVr.  3 weeks ago had laceration to the back of her R ankle. Her sutures were removed in ID clinic (placed there as well?). She had some gap in her wound after sutures removed.  Today complains of wound d/c. She also complains of sore throat, fever all day yesterday, body aches. She is not able to describe the d/c from her wound.     Review of Systems     Objective:   Physical Exam  Constitutional: She appears well-developed and well-nourished.  HENT:  Mouth/Throat: No oropharyngeal exudate.  Eyes: EOM are normal. Pupils are equal, round, and reactive to light.  Neck: Neck supple.  Cardiovascular: Normal rate, regular rhythm and normal heart sounds.   Pulmonary/Chest: Effort normal and breath sounds normal.  Abdominal: Soft. Bowel sounds are normal. There is no tenderness.  Musculoskeletal:       Feet:  Lymphadenopathy:    She has no cervical adenopathy.          Assessment & Plan:

## 2012-12-13 ENCOUNTER — Ambulatory Visit (INDEPENDENT_AMBULATORY_CARE_PROVIDER_SITE_OTHER): Payer: Medicaid Other | Admitting: Obstetrics & Gynecology

## 2012-12-13 ENCOUNTER — Encounter: Payer: Self-pay | Admitting: Obstetrics & Gynecology

## 2012-12-13 VITALS — BP 125/82 | HR 86 | Temp 98.1°F | Ht 60.0 in | Wt 138.4 lb

## 2012-12-13 DIAGNOSIS — R102 Pelvic and perineal pain: Secondary | ICD-10-CM

## 2012-12-13 DIAGNOSIS — N949 Unspecified condition associated with female genital organs and menstrual cycle: Secondary | ICD-10-CM

## 2012-12-13 MED ORDER — IBUPROFEN 200 MG PO TABS: ORAL_TABLET | ORAL | Status: DC

## 2012-12-13 NOTE — Patient Instructions (Signed)
Pelvic Pain Pelvic pain is pain below the belly button and located between your hips. Acute pain may last a few hours or days. Chronic pelvic pain may last weeks and months. The cause may be different for different types of pain. The pain may be dull or sharp, mild or severe and can interfere with your daily activities. Write down and tell your caregiver:   Exactly where the pain is located.  If it comes and goes or is there all the time.  When it happens (with sex, urination, bowel movement, etc.)  If the pain is related to your menstrual period or stress. Your caregiver will take a full history and do a complete physical exam and Pap test. CAUSES   Painful menstrual periods (dysmenorrhea).  Normal ovulation (Mittelschmertz) that occurs in the middle of the menstrual cycle every month.  The pelvic organs get engorged with blood just before the menstrual period (pelvic congestive syndrome).  Scar tissue from an infection or past surgery (pelvic adhesions).  Cancer of the female pelvic organs. When there is pain with cancer, it has been there for a long time.  The lining of the uterus (endometrium) abnormally grows in places like the pelvis and on the pelvic organs (endometriosis).  A form of endometriosis with the lining of the uterus present inside of the muscle tissue of the uterus (adenomyosis).  Fibroid tumor (noncancerous) in the uterus.  Bladder problems such as infection, bladder spasms of the muscle tissue of the bladder.  Intestinal problems (irritable bowel syndrome, colitis, an ulcer or gastrointestinal infection).  Polyps of the cervix or uterus.  Pregnancy in the tube (ectopic pregnancy).  The opening of the cervix is too small for the menstrual blood to flow through it (cervical stenosis).  Physical or sexual abuse (past or present).  Musculo-skeletal problems from poor posture, problems with the vertebrae of the lower back or the uterine pelvic muscles falling  (prolapse).  Psychological problems such as depression or stress.  IUD (intrauterine device) in the uterus. DIAGNOSIS  Tests to make a diagnosis depends on the type, location, severity and what causes the pain to occur. Tests that may be needed include:  Blood tests.  Urine tests  Ultrasound.  X-rays.  CT Scan.  MRI.  Laparoscopy.  Major surgery. TREATMENT  Treatment will depend on the cause of the pain, which includes:  Prescription or over-the-counter pain medication.  Antibiotics.  Birth control pills.  Hormone treatment.  Nerve blocking injections.  Physical therapy.  Antidepressants.  Counseling with a psychiatrist or psychologist.  Minor or major surgery. HOME CARE INSTRUCTIONS   Only take over-the-counter or prescription medicines for pain, discomfort or fever as directed by your caregiver.  Follow your caregiver's advice to treat your pain.  Rest.  Avoid sexual intercourse if it causes the pain.  Apply warm or cold compresses (which ever works best) to the pain area.  Do relaxation exercises such as yoga or meditation.  Try acupuncture.  Avoid stressful situations.  Try group therapy.  If the pain is because of a stomach/intestinal upset, drink clear liquids, eat a bland light food diet until the symptoms go away. SEEK MEDICAL CARE IF:   You need stronger prescription pain medication.  You develop pain with sexual intercourse.  You have pain with urination.  You develop a temperature of 102 F (38.9 C) with the pain.  You are still in pain after 4 hours of taking prescription medication for the pain.  You need depression medication.    Your IUD is causing pain and you want it removed. SEEK IMMEDIATE MEDICAL CARE IF:  You develop very severe pain or tenderness.  You faint, have chills, severe weakness or dehydration.  You develop heavy vaginal bleeding or passing solid tissue.  You develop a temperature of 102 F (38.9 C)  with the pain.  You have blood in the urine.  You are being physically or sexually abused.  You have uncontrolled vomiting and diarrhea.  You are depressed and afraid of harming yourself or someone else. Document Released: 01/08/2005 Document Revised: 02/23/2012 Document Reviewed: 10/05/2008 Exit[employer] Patient Information 2013 [employer].  

## 2012-12-13 NOTE — Progress Notes (Signed)
  Subjective:    Patient ID: Kelsey Rodriguez, female    DOB: 1991-04-14, 21 y.o.   MRN: [id number]  HPI  21 yo S AA P0 who is here for a 5 year h/o pelvic pain since her abortion at 21 yo. It is worse with her periods. Tylenol is no help. She has not tried IBU yet. She has been abstinent since April 2013 but she did have dyspareunia when she did have sex in the past.  Review of Systems She thinks that she was treated for chlamydia once when she was back in [place]. She does not think that she has had the Gardasil series.    Objective:   Physical Exam        Assessment & Plan:

## 2012-12-13 NOTE — Progress Notes (Signed)
States she had period twice this month. Has foul odor vaginal d/c with itching that comes and goes, not present right now. Reports pelvic pain for the past two years. Pain does worsen with menses

## 2012-12-14 LAB — GC/CHLAMYDIA PROBE AMP, URINE
Chlamydia, Swab/Urine, PCR: NEGATIVE
GC Probe Amp, Urine: NEGATIVE

## 2012-12-16 ENCOUNTER — Telehealth: Payer: Self-pay | Admitting: *Deleted

## 2012-12-16 ENCOUNTER — Other Ambulatory Visit: Payer: Medicaid Other

## 2012-12-16 DIAGNOSIS — B20 Human immunodeficiency virus [HIV] disease: Secondary | ICD-10-CM

## 2012-12-16 DIAGNOSIS — Z79899 Other long term (current) drug therapy: Secondary | ICD-10-CM

## 2012-12-16 LAB — LIPID PANEL
Cholesterol: 215 mg/dL — ABNORMAL HIGH (ref 0–200)
HDL: 64 mg/dL (ref >39)
LDL Cholesterol: 135 mg/dL — ABNORMAL HIGH (ref 0–99)
Total CHOL/HDL Ratio: 3.4 Ratio
Triglycerides: 78 mg/dL (ref <150)
VLDL: 16 mg/dL (ref 0–40)

## 2012-12-16 LAB — COMPREHENSIVE METABOLIC PANEL
ALT: 9 U/L (ref 0–35)
AST: 17 U/L (ref 0–37)
Albumin: 4.4 g/dL (ref 3.5–5.2)
Alkaline Phosphatase: 81 U/L (ref 39–117)
BUN: 9 mg/dL (ref 6–23)
CO2: 23 mEq/L (ref 19–32)
Calcium: 9.2 mg/dL (ref 8.4–10.5)
Chloride: 108 mEq/L (ref 96–112)
Creat: 0.81 mg/dL (ref 0.50–1.10)
Glucose, Bld: 81 mg/dL (ref 70–99)
Potassium: 4.6 mEq/L (ref 3.5–5.3)
Sodium: 137 mEq/L (ref 135–145)
Total Bilirubin: 0.2 mg/dL — ABNORMAL LOW (ref 0.3–1.2)
Total Protein: 7.9 g/dL (ref 6.0–8.3)

## 2012-12-16 LAB — IRON AND TIBC
%SAT: 5 % — ABNORMAL LOW (ref 20–55)
Iron: 22 ug/dL — ABNORMAL LOW (ref 42–145)
TIBC: 407 ug/dL (ref 250–470)
UIBC: 385 ug/dL (ref 125–400)

## 2012-12-16 LAB — FERRITIN: Ferritin: 3 ng/mL — ABNORMAL LOW (ref 10–291)

## 2012-12-16 NOTE — Telephone Encounter (Signed)
Patient came to clinic for lab work and is c/o of chest hurting when she coughs. There are no available appointments today or tomorrow, and as patient has insurance she was advised to go to the urgent care to be evaluated. She agreed to this. Wendall Mola CMA

## 2012-12-17 LAB — CBC WITH DIFFERENTIAL/PLATELET
Basophils Absolute: 0 10*3/uL (ref 0.0–0.1)
Basophils Relative: 1 % (ref 0–1)
Eosinophils Absolute: 0.3 10*3/uL (ref 0.0–0.7)
Eosinophils Relative: 6 % — ABNORMAL HIGH (ref 0–5)
HCT: 32.8 % — ABNORMAL LOW (ref 36.0–46.0)
Hemoglobin: 10.7 g/dL — ABNORMAL LOW (ref 12.0–15.0)
Lymphocytes Relative: 43 % (ref 12–46)
Lymphs Abs: 2 10*3/uL (ref 0.7–4.0)
MCH: 27.6 pg (ref 26.0–34.0)
MCHC: 32.6 g/dL (ref 30.0–36.0)
MCV: 86.1 fL (ref 78.0–100.0)
Monocytes Absolute: 0.4 10*3/uL (ref 0.1–1.0)
Monocytes Relative: 9 % (ref 3–12)
Neutro Abs: 1.9 10*3/uL (ref 1.7–7.7)
Neutrophils Relative %: 41 % — ABNORMAL LOW (ref 43–77)
Platelets: 237 10*3/uL (ref 150–400)
RBC: 3.88 MIL/uL (ref 3.87–5.11)
RDW: 22.4 % — ABNORMAL HIGH (ref 11.5–15.5)
WBC: 4.5 10*3/uL (ref 4.0–10.5)

## 2012-12-17 LAB — T-HELPER CELL (CD4) - (RCID CLINIC ONLY)
CD4 % Helper T Cell: 15 % — ABNORMAL LOW (ref 33–55)
CD4 T Cell Abs: 290 uL — ABNORMAL LOW (ref 400–2700)

## 2012-12-17 LAB — HIV-1 RNA QUANT-NO REFLEX-BLD
HIV 1 RNA Quant: <20 copies/mL (ref <20)
HIV-1 RNA Quant, Log: <1.3 {Log} (ref <1.30)

## 2012-12-22 ENCOUNTER — Encounter: Payer: Self-pay | Admitting: *Deleted

## 2012-12-22 ENCOUNTER — Telehealth: Payer: Self-pay

## 2012-12-22 ENCOUNTER — Ambulatory Visit (HOSPITAL_COMMUNITY)
Admission: RE | Admit: 2012-12-22 | Discharge: 2012-12-22 | Disposition: A | Discharge status: Home / Self Care | Payer: Medicaid Other | Source: Ambulatory Visit | Attending: Obstetrics & Gynecology | Admitting: Obstetrics & Gynecology

## 2012-12-22 DIAGNOSIS — N949 Unspecified condition associated with female genital organs and menstrual cycle: Secondary | ICD-10-CM | POA: Insufficient documentation

## 2012-12-22 DIAGNOSIS — N838 Other noninflammatory disorders of ovary, fallopian tube and broad ligament: Secondary | ICD-10-CM | POA: Insufficient documentation

## 2012-12-22 DIAGNOSIS — R102 Pelvic and perineal pain: Secondary | ICD-10-CM

## 2012-12-22 DIAGNOSIS — N840 Polyp of corpus uteri: Secondary | ICD-10-CM | POA: Insufficient documentation

## 2012-12-22 NOTE — Telephone Encounter (Signed)
Copy printed for patient.   Message left with family member letter is ready for pick up.   Laurell Josephs, RN

## 2012-12-30 ENCOUNTER — Encounter: Payer: Self-pay | Admitting: Internal Medicine

## 2012-12-30 ENCOUNTER — Ambulatory Visit (INDEPENDENT_AMBULATORY_CARE_PROVIDER_SITE_OTHER): Payer: Medicaid Other | Admitting: Internal Medicine

## 2012-12-30 VITALS — BP 120/74 | HR 72 | Temp 98.7°F | Ht 65.0 in | Wt 140.0 lb

## 2012-12-30 DIAGNOSIS — Z21 Asymptomatic human immunodeficiency virus [HIV] infection status: Secondary | ICD-10-CM

## 2012-12-30 DIAGNOSIS — D509 Iron deficiency anemia, unspecified: Secondary | ICD-10-CM

## 2012-12-30 DIAGNOSIS — B20 Human immunodeficiency virus [HIV] disease: Secondary | ICD-10-CM

## 2012-12-30 NOTE — Progress Notes (Signed)
[facility] HIV CLINIC NOTE  RFV: routine visit Subjective:    Patient ID: Kelsey Rodriguez, female    DOB: [birth year], 22 y.o.   MRN: [id number]  HPI 22yo F originally from [place], HIV disease dx in 2013 c/b mTB, finished 9 mo of RIPE in 10/13, CD 4 count 290/ VL < 20, on truvada-boosted darunavir, doing well with once a day regimen. She reports missing 2 doses since last visit. She has now been on this regimen for the past 3 months. She was seen by gynecology for irregular menses/pelvic pain for which she is getting blood work and TV ultrasound to be seen again on 01/13/13. Other non-related injury to right ankle, laceration is well healed.  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 She reports being fatigued when she finishes the day. Not used to being back to work.  Has stopped taking valtrex since she felt it caused feel unwell. Only taking HIV meds.  Current Outpatient Prescriptions on File Prior to Visit  Medication Sig Dispense Refill  . Darunavir Ethanolate (PREZISTA) 800 MG tablet Take 1 tablet (800 mg total) by mouth daily with breakfast.  30 tablet  11  . emtricitabine-tenofovir (TRUVADA) 200-300 MG per tablet Take 1 tablet by mouth daily.  30 tablet  5  . ENSURE (ENSURE) Take 237 mLs by mouth 2 (two) times daily between meals.      [name] ibuprofen (ADVIL,MOTRIN) 200 MG tablet Take 1 pill round the clock for 1 week and then prn pain  60 tablet  3  . ritonavir (NORVIR) 100 MG TABS Take 1 tablet (100 mg total) by mouth daily.  30 tablet  11  . valACYclovir (VALTREX) 1000 MG tablet Take 1 tablet (1,000 mg total) by mouth daily.  30 tablet  11    Active Ambulatory Problems    Diagnosis Date Noted  . Chest pain 03/11/2012  . Dysphagia 03/11/2012  . 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 Anemia of chronic disease 03/11/2012  . Reflux esophagitis 03/11/2012  . 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 HIV (human immunodeficiency  virus infection) 03/16/2012  . Arthralgia 05/20/2012  . 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 Vaginal Discharge 05/30/2012  . Laceration of ankle, right 11/18/2012   Resolved Ambulatory Problems    Diagnosis Date Noted  . No Resolved Ambulatory Problems   Past Medical History  Diagnosis Date  . Tuberculosis       Review of Systems 10 point ROS reviewed, positive pertinents listed in HPI    Objective:   Physical Exam BP 120/74  Pulse 72  Temp 98.7 F (37.1 C) (Oral)  Ht 5\' 5"  (1.651 m)  Wt 140 lb (63.504 kg)  BMI 23.30 kg/m2  LMP 12/12/2012  Physical Exam  Constitutional:  oriented to person, place, and time.  appears well-developed and well-nourished. No distress.  HENT:  Mouth/Throat: Oropharynx is clear and moist. No oropharyngeal exudate.  Cardiovascular: Normal rate, regular rhythm and normal heart sounds. Exam reveals no gallop and no friction rub.  No murmur heard.  Pulmonary/Chest: Effort normal and breath sounds normal. No respiratory distress. no wheezes.  Abdominal: Soft. Bowel sounds are normal.  no distension. There is no tenderness.  Lymphadenopathy: no cervical adenopathy.  Skin: Skin is warm and dry. No rash noted. No erythema.  Psychiatric:  normal mood and affect.  behavior is normal.      Assessment & Plan:  HIV = continue with truvada, darunavir, ritonavir daily  Iron deficiency anemia = will ask her to start taking iron 325mg  BID. Colace if needed for stool softener.  Health maintenance=uptodate on vaccines, next hep B#2 due in 1 month.  Gyn = will follow up with gyn.  rtc in 3months

## 2013-01-13 ENCOUNTER — Encounter: Payer: Self-pay | Admitting: Obstetrics & Gynecology

## 2013-01-13 ENCOUNTER — Ambulatory Visit (INDEPENDENT_AMBULATORY_CARE_PROVIDER_SITE_OTHER): Payer: Medicaid Other | Admitting: Obstetrics & Gynecology

## 2013-01-13 VITALS — BP 117/78 | HR 83 | Temp 98.6°F | Ht 60.0 in | Wt 137.7 lb

## 2013-01-13 DIAGNOSIS — N946 Dysmenorrhea, unspecified: Secondary | ICD-10-CM

## 2013-01-13 DIAGNOSIS — N949 Unspecified condition associated with female genital organs and menstrual cycle: Secondary | ICD-10-CM

## 2013-01-13 DIAGNOSIS — N840 Polyp of corpus uteri: Secondary | ICD-10-CM

## 2013-01-13 DIAGNOSIS — R102 Pelvic and perineal pain: Secondary | ICD-10-CM

## 2013-01-13 MED ORDER — NORETHINDRONE ACET-ETHINYL EST 1.5-30 MG-MCG PO TABS: 1.0000 | ORAL_TABLET | Freq: Every day | ORAL | Status: DC

## 2013-01-13 MED ORDER — IBUPROFEN 800 MG PO TABS: 800.0000 mg | ORAL_TABLET | Freq: Three times a day (TID) | ORAL | Status: DC | PRN

## 2013-01-13 MED ORDER — TRAMADOL HCL 50 MG PO TABS: 50.0000 mg | ORAL_TABLET | Freq: Four times a day (QID) | ORAL | Status: DC | PRN

## 2013-01-13 NOTE — Patient Instructions (Signed)
Pelvic Pain, Female Female pelvic pain can be caused by many different things and start from a variety of places. Pelvic pain refers to pain that is located in the lower half of the abdomen and between your hips. The pain may occur over a short period of time (acute) or may be reoccurring (chronic). The cause of pelvic pain may be related to disorders affecting the female reproductive organs (gynecologic), but it may also be related to the bladder, kidney stones, an intestinal complication, or muscle or skeletal problems. Getting help right away for pelvic pain is important, especially if there has been severe, sharp, or a sudden onset of unusual pain. It is also important to get help right away because some types of pelvic pain can be life threatening.  CAUSES  Below are only some of the causes of pelvic pain. The causes of pelvic pain can be in one of several categories.   Gynecologic.  Pelvic inflammatory disease.  Sexually transmitted infection.  Ovarian cyst or a twisted ovarian ligament (ovarian torsion).  Uterine lining that grows outside the uterus (endometriosis).  Fibroids, cysts, or tumors.  Ovulation.  Pregnancy.  Pregnancy that occurs outside the uterus (ectopic pregnancy).  Miscarriage.  Labor.  Abruption of the placenta or ruptured uterus.  Infection.  Uterine infection (endometritis).  Bladder infection.  Diverticulitis.  Miscarriage related to a uterine infection (septic abortion).  Bladder.  Inflammation of the bladder (cystitis).  Kidney stone(s).  Gastrointenstinal.  Constipation.  Diverticulitis.  Neurologic.  Trauma.  Feeling pelvic pain because of mental or emotional causes (psychosomatic).  Cancers of the bowel or pelvis. EVALUATION  Your caregiver will want to take a careful history of your concerns. This includes recent changes in your health, a careful gynecologic history of your periods (menses), and a sexual history. Obtaining  your family history and medical history is also important. Your caregiver may suggest a pelvic exam. A pelvic exam will help identify the location and severity of the pain. It also helps in the evaluation of which organ system may be involved. In order to identify the cause of the pelvic pain and be properly treated, your caregiver may order tests. These tests may include:   A pregnancy test.  Pelvic ultrasonography.  An X-ray exam of the abdomen.  A urinalysis or evaluation of vaginal discharge.  Blood tests. HOME CARE INSTRUCTIONS   Only take over-the-counter or prescription medicines for pain, discomfort, or fever as directed by your caregiver.   Rest as directed by your caregiver.   Eat a balanced diet.   Drink enough fluids to make your urine clear or pale yellow, or as directed.   Avoid sexual intercourse if it causes pain.   Apply warm or cold compresses to the lower abdomen depending on which one helps the pain.   Avoid stressful situations.   Keep a journal of your pelvic pain. Write down when it started, where the pain is located, and if there are things that seem to be associated with the pain, such as food or your menstrual cycle.  Follow up with your caregiver as directed.  SEEK MEDICAL CARE IF:  Your medicine does not help your pain.  You have abnormal vaginal discharge. SEEK IMMEDIATE MEDICAL CARE IF:   You have heavy bleeding from the vagina.   Your pelvic pain increases.   You feel lightheaded or faint.   You have chills.   You have pain with urination or blood in your urine.   You have uncontrolled  diarrhea or vomiting.   You have a fever or persistent symptoms for more than 3 days.  You have a fever and your symptoms suddenly get worse.   You are being physically or sexually abused.  MAKE SURE YOU:  Understand these instructions.  Will watch your condition.  Will get help if you are not doing well or get worse. Document  Released: 10/28/2004 Document Revised: 06/01/2012 Document Reviewed: 03/22/2012 [facility] Patient Information 2013 [place], [place]. Dysmenorrhea Menstrual pain is caused by the muscles of the uterus tightening (contracting) during a menstrual period. The muscles of the uterus contract due to the chemicals in the uterine lining. Primary dysmenorrhea is menstrual cramps that last a couple of days when you start having menstrual periods or soon after. This often begins after a teenager starts having her period. As a woman gets older or has a baby, the cramps will usually lesson or disappear. Secondary dysmenorrhea begins later in life, lasts longer, and the pain may be stronger than primary dysmenorrhea. The pain may start before the period and last a few days after the period. This type of dysmenorrhea is usually caused by an underlying problem such as:  The tissue lining the uterus grows outside of the uterus in other areas of the body (endometriosis).  The endometrial tissue, which normally lines the uterus, is found in or grows into the muscular walls of the uterus (adenomyosis).  The pelvic blood vessels are engorged with blood just before the menstrual period (pelvic congestive syndrome).  Overgrowth of cells in the lining of the uterus or cervix (polyps of the uterus or cervix).  Falling down of the uterus (prolapse) because of loose or stretched ligaments.  Depression.  Bladder problems, infection, or inflammation.  Problems with the intestine, a tumor, or irritable bowel syndrome.  Cancer of the female organs or bladder.  A severely tipped uterus.  A very tight opening or closed cervix.  Noncancerous tumors of the uterus (fibroids).  Pelvic inflammatory disease (PID).  Pelvic scarring (adhesions) from a previous surgery.  Ovarian cyst.  An intrauterine device (IUD) used for birth control. CAUSES  The cause of menstrual pain is often unknown. SYMPTOMS   Cramping or  throbbing pain in your lower abdomen.  Sometimes, a woman may also experience headaches.  Lower back pain.  Feeling sick to your stomach (nausea) or vomiting.  Diarrhea.  Sweating or dizziness. DIAGNOSIS  A diagnosis is based on your history, symptoms, physical examination, diagnostic tests, or procedures. Diagnostic tests or procedures may include:  Blood tests.  An ultrasound.  An examination of the lining of the uterus (dilation and curettage, D&C).  An examination inside your abdomen or pelvis with a scope (laparoscopy).  X-rays.  CT Scan.  MRI.  An examination inside the bladder with a scope (cystoscopy).  An examination inside the intestine or stomach with a scope (colonoscopy, gastroscopy). TREATMENT  Treatment depends on the cause of the dysmenorrhea. Treatment may include:  Pain medicine prescribed by your caregiver.  Birth control pills.  Hormone replacement therapy.  Nonsteroidal anti-inflammatory drugs (NSAIDs). These may help stop the production of prostaglandins.  An IUD with progesterone hormone in it.  Acupuncture.  Surgery to remove adhesions, endometriosis, ovarian cyst, or fibroids.  Removal of the uterus (hysterectomy).  Progesterone shots to stop the menstrual period.  Cutting the nerves on the sacrum that go to the female organs (presacral neurectomy).  Electric currant to the sacral nerves (sacral nerve stimulation).  Antidepressant medicine.  Psychiatric therapy, counseling,  or group therapy.  Exercise and physical therapy.  Meditation and yoga therapy. HOME CARE INSTRUCTIONS   Only take over-the-counter or prescription medicines for pain, discomfort, or fever as directed by your caregiver.  Place a heating pad or hot water bottle on your lower back or abdomen. Do not sleep with the heating pad.  Use aerobic exercises, walking, swimming, biking, and other exercises to help lessen the cramping.  Massage to the lower back or  abdomen may help.  Stop smoking.  Avoid alcohol and caffeine.  Yoga, meditation, or acupuncture may help. SEEK MEDICAL CARE IF:   The pain does not get better with medicine.  You have pain with sexual intercourse. SEEK IMMEDIATE MEDICAL CARE IF:   Your pain increases and is not controlled with medicines.  You have a fever.  You develop nausea or vomiting with your period not controlled with medicine.  You have abnormal vaginal bleeding with your period.  You pass out. MAKE SURE YOU:   Understand these instructions.  Will watch your condition.  Will get help right away if you are not doing well or get worse. Document Released: 12/01/2005 Document Revised: 02/23/2012 Document Reviewed: 03/19/2009 [facility] Patient Information 2013 [place], [place].

## 2013-01-13 NOTE — Progress Notes (Signed)
Subjective:     Patient ID: Kelsey Rodriguez, female   DOB: [birth year], 22 y.o.   MRN: [id number]  HPI Pt reports a h/o pelvic pain for ~6years.  She reports that the pain is very intense when she is on her menses.  She reports that Motrin has not been helpful.  She also c/o 2 cycles per month for ~64months.  She denies pain currently but, is here to review her sono that was ordered afer her last visit in Dec.  Pt denies new sx.     Review of Systems     Objective:   Physical ExamBP 117/78  Pulse 83  Temp 98.6 F (37 C)  Ht 5' (1.524 m)  Wt 137 lb 11.2 oz (62.46 kg)  BMI 26.89 kg/m2  LMP 01/10/2013 P.E. Exam deferred  sono 12/22/12 Findings:  Uterus: Measures 7.5 x 3.7 x 5.0 cm. No myometrial abnormalities  are demonstrated.  Endometrium: Normal in thickness measuring a maximum of 13.9 mm.  There is a 7.5 x 11.0 x 7.5 mm rounded echogenic endometrial lesion  which is most likely an endometrial polyp.  Right ovary: Measures 3.9 x 4.1 x 1.6 cm. Small follicles. No  cysts or masses.  Left ovary: Measures 6.0 x 1.85 4.9 cm. Multiple cysts are noted.  The largest measures 2.8 x 5.2 x 2.3 cm.  Other findings: A small amount of free pelvic fluid is noted.  IMPRESSION:  1. 7.5 x 11.0 x 7.5 mm endometrial polyp.  2. Multiple cysts associated with the left ovary.       Assessment:    Reviewed sono results Irreg bleeding suspect from endometrial polyp will attempt to resolve with OCPs Dysmenorrhea/pelvic pain- OCP's should help with that as well    Plan:     Lo Estrin 1.5/30 1 po q day Ultram 50mg  po q 6hours prn pain with menses Motrin 800mg  po q 8 hours prn pain with menses F/u 3 months or sooner prn

## 2013-01-13 NOTE — Progress Notes (Signed)
Here for follow up from pelvic pain visit in December. Reports had 2 periods in December and January.

## 2013-02-28 ENCOUNTER — Encounter: Payer: Self-pay | Admitting: Internal Medicine

## 2013-02-28 ENCOUNTER — Ambulatory Visit (INDEPENDENT_AMBULATORY_CARE_PROVIDER_SITE_OTHER): Payer: Medicaid Other | Admitting: Internal Medicine

## 2013-02-28 ENCOUNTER — Telehealth: Payer: Self-pay

## 2013-02-28 ENCOUNTER — Ambulatory Visit
Admission: RE | Admit: 2013-02-28 | Discharge: 2013-02-28 | Disposition: A | Discharge status: Home / Self Care | Payer: Medicaid Other | Source: Ambulatory Visit | Attending: Internal Medicine | Admitting: Internal Medicine

## 2013-02-28 VITALS — BP 132/82 | HR 92 | Temp 98.3°F | Ht 60.0 in | Wt 143.0 lb

## 2013-02-28 DIAGNOSIS — R091 Pleurisy: Secondary | ICD-10-CM

## 2013-02-28 NOTE — Progress Notes (Signed)
[facility] CLIN[facility] NOTE  RFV: sick visit, pain on deep inspiration and lifting Subjective:    Patient ID: Kelsey Rodriguez, female    DOB: [birth year], 22 y.o.   MRN: [id number]  HPI Kelsey Rodriguez is a 22yo F diagnosed with HIV in 2013, in setting of pulm MTB & mediastinal mass finished 9 months of treatment Fall 2013. Recent CD 4 coutn of 290/VL<20 currently on truvada/DRVr. She presents to HIV clinic for new onset, intermittent left side chest pain when she takes a deep inspiration, but also notices it when she occasionally has heavy lifting. This is been going on for the past month, but she is concerned since this is similar to symptoms that she had when she was hospitalized last year.  She takes ibuprofen, which helps her pain when needed.  No fever, chills, cough, sweating at night.  Current Outpatient Prescriptions on File Prior to Visit  Medication Sig Dispense Refill  . Darunavir Ethanolate (PREZISTA) 800 MG tablet Take 1 tablet (800 mg total) by mouth daily with breakfast.  30 tablet  11  . emtricitabine-tenofovir (TRUVADA) 200-300 MG per tablet Take 1 tablet by mouth daily.  30 tablet  5  . ENSURE (ENSURE) Take 237 mLs by mouth 2 (two) times daily between meals.      [name] ibuprofen (ADVIL,MOTRIN) 800 MG tablet Take 1 tablet (800 mg total) by mouth every 8 (eight) hours as needed for pain.  60 tablet  1  . Norethindrone Acetate-Ethinyl Estradiol (JUNEL,LOESTRIN,MICROGESTIN) 1.5-30 MG-MCG tablet Take 1 tablet by mouth daily.  1 Package  11  . ritonavir (NORVIR) 100 MG TABS Take 1 tablet (100 mg total) by mouth daily.  30 tablet  11  . traMADol (ULTRAM) 50 MG tablet Take 1 tablet (50 mg total) by mouth every 6 (six) hours as needed for pain.  60 tablet  0   No current facility-administered medications on file prior to visit.   Active Ambulatory Problems    Diagnosis Date Noted  . Chest pain 03/11/2012  . Dysphagia 03/11/2012  . 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 Anemia  of chronic disease 03/11/2012  . Reflux esophagitis 03/11/2012  . 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 HIV (human immunodeficiency virus infection) 03/16/2012  . Arthralgia 05/20/2012  . 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 Vaginal Discharge 05/30/2012  . Laceration of ankle, right 11/18/2012   Resolved Ambulatory Problems    Diagnosis Date Noted  . No Resolved Ambulatory Problems   Past Medical History  Diagnosis Date  . Tuberculosis   . Pelvic pain     Review of Systems Reports loss of appetite,     Objective:   Physical Exam BP 132/82  Pulse 92  Temp(Src) 98.3 F (36.8 C) (Oral)  Ht 5' (1.524 m)  Wt 143 lb (64.864 kg)  BMI 27.93 kg/m2 Physical Exam (3 lb more than last visit in jan) Constitutional: He is oriented to person, place, and time. He appears well-developed and well-nourished. No distress.  HENT:  Mouth/Throat: Oropharynx is clear and moist. No oropharyngeal exudate.  Cardiovascular: Normal rate, regular rhythm and normal heart sounds. Exam reveals no gallop and no friction rub.  No murmur heard.  Pulmonary/Chest: Effort normal and breath sounds normal. No respiratory distress. He has no wheezes.  Chest wall = keloid scar on left upper chest wall from prior surgical excisional biopsy. Mild tenderness on deep palpation of chest wall. No pain with moving arms above head. Lymphadenopathy:  no cervical adenopathy.  Neurological:  alert  and oriented to person, place, and time.  Skin: Skin is warm and dry. No rash noted. No erythema.       Assessment & Plan:  Hiv= continue with medications, continue with excellent adherence.  pleuretic chest pain = checked cxr today that showed scarring in left upper lobe somewhat marginally smaller than last cxr in oct 2013. No other abnormality. For now, will ask her to continue with ibuprofen prn. Call us for appt if it persists.  Health maintenance = took hep B #2 at work. Needs pulm system review and cxr for TB screen for work in  April  rtc in 1 month

## 2013-02-28 NOTE — Telephone Encounter (Signed)
Pt walked into clinic c/o pain right upper chest similar to chest pain when she was admitted to hospital. Patient states her pain has been present on and off for 1 month and not associated with any shortness of breath, pressure or radiating to other areas.    Dr Drue Second will see patient today.   Laurell Josephs, RN

## 2013-03-11 ENCOUNTER — Ambulatory Visit (INDEPENDENT_AMBULATORY_CARE_PROVIDER_SITE_OTHER): Payer: Medicaid Other | Admitting: Internal Medicine

## 2013-03-11 ENCOUNTER — Encounter: Payer: Self-pay | Admitting: Internal Medicine

## 2013-03-11 ENCOUNTER — Telehealth: Payer: Self-pay | Admitting: *Deleted

## 2013-03-11 VITALS — BP 125/75 | HR 89 | Temp 99.5°F | Ht 65.0 in | Wt 141.2 lb

## 2013-03-11 DIAGNOSIS — B20 Human immunodeficiency virus [HIV] disease: Secondary | ICD-10-CM

## 2013-03-11 DIAGNOSIS — B9789 Other viral agents as the cause of diseases classified elsewhere: Secondary | ICD-10-CM

## 2013-03-11 DIAGNOSIS — B349 Viral infection, unspecified: Secondary | ICD-10-CM

## 2013-03-11 LAB — BASIC METABOLIC PANEL
BUN: 7 mg/dL (ref 6–23)
CO2: 25 mEq/L (ref 19–32)
Calcium: 9 mg/dL (ref 8.4–10.5)
Chloride: 105 mEq/L (ref 96–112)
Creat: 0.82 mg/dL (ref 0.50–1.10)
Glucose, Bld: 82 mg/dL (ref 70–99)
Potassium: 4.1 mEq/L (ref 3.5–5.3)
Sodium: 138 mEq/L (ref 135–145)

## 2013-03-11 LAB — CBC WITH DIFFERENTIAL/PLATELET
Basophils Absolute: 0 10*3/uL (ref 0.0–0.1)
Basophils Relative: 1 % (ref 0–1)
Eosinophils Absolute: 0.1 10*3/uL (ref 0.0–0.7)
Eosinophils Relative: 3 % (ref 0–5)
HCT: 31.7 % — ABNORMAL LOW (ref 36.0–46.0)
Hemoglobin: 10.5 g/dL — ABNORMAL LOW (ref 12.0–15.0)
Lymphocytes Relative: 34 % (ref 12–46)
Lymphs Abs: 1.2 10*3/uL (ref 0.7–4.0)
MCH: 27.5 pg (ref 26.0–34.0)
MCHC: 33.1 g/dL (ref 30.0–36.0)
MCV: 83 fL (ref 78.0–100.0)
Monocytes Absolute: 1 10*3/uL (ref 0.1–1.0)
Monocytes Relative: 28 % — ABNORMAL HIGH (ref 3–12)
Neutro Abs: 1.2 10*3/uL — ABNORMAL LOW (ref 1.7–7.7)
Neutrophils Relative %: 34 % — ABNORMAL LOW (ref 43–77)
Platelets: 181 10*3/uL (ref 150–400)
RBC: 3.82 MIL/uL — ABNORMAL LOW (ref 3.87–5.11)
RDW: 16.3 % — ABNORMAL HIGH (ref 11.5–15.5)
WBC: 3.5 10*3/uL — ABNORMAL LOW (ref 4.0–10.5)

## 2013-03-11 LAB — T-HELPER CELL (CD4) - (RCID CLINIC ONLY)
CD4 % Helper T Cell: 21 % — ABNORMAL LOW (ref 33–55)
CD4 T Cell Abs: 220 uL — ABNORMAL LOW (ref 400–2700)

## 2013-03-11 NOTE — Telephone Encounter (Signed)
Patient called reporting vomiting and fever all night long.  Appointment given with Dr. Drue Second for today at 10:30. Andree Coss, RN

## 2013-03-11 NOTE — Progress Notes (Signed)
[facility] NOTE  RFV: sick visit, flu like illness Subjective:    Patient ID: Kelsey Rodriguez, female    DOB: 1991/11/27, 22 y.o.   MRN: [id number]  HPI22yo F with HIV-TB coinfection,finished DOT for TB in Fall 2013, CD 4 count 290/VL<20 (in Jan 2014), on  Truvada/DRVr. Has beeng doing well with HIV medication, not missing a dose. However, she reports quick onset of feeling feverish, body aches, headache yesterday. Last night, she noticed started with intermittent dry cough, and chest pain with cough. Took ibuprofen 800mg  twice yesterday which helped with fever but not headache.No one else that is ill in the household nor at work.  Ros: no runny nose, no diarrhea, no abdominal pain, no sinus congestion. Other positive ROS listed in hpi. No documented fever  Current Outpatient Prescriptions on File Prior to Visit  Medication Sig Dispense Refill  . Darunavir Ethanolate (PREZISTA) 800 MG tablet Take 1 tablet (800 mg total) by mouth daily with breakfast.  30 tablet  11  . emtricitabine-tenofovir (TRUVADA) 200-300 MG per tablet Take 1 tablet by mouth daily.  30 tablet  5  . ENSURE (ENSURE) Take 237 mLs by mouth 2 (two) times daily between meals.      [name] ibuprofen (ADVIL,MOTRIN) 800 MG tablet Take 1 tablet (800 mg total) by mouth every 8 (eight) hours as needed for pain.  60 tablet  1  . Norethindrone Acetate-Ethinyl Estradiol (JUNEL,LOESTRIN,MICROGESTIN) 1.5-30 MG-MCG tablet Take 1 tablet by mouth daily.  1 Package  11  . ritonavir (NORVIR) 100 MG TABS Take 1 tablet (100 mg total) by mouth daily.  30 tablet  11  . traMADol (ULTRAM) 50 MG tablet Take 1 tablet (50 mg total) by mouth every 6 (six) hours as needed for pain.  60 tablet  0   No current facility-administered medications on file prior to visit.   Active Ambulatory Problems    Diagnosis Date Noted  . Chest pain 03/11/2012  . Dysphagia 03/11/2012  . 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 Anemia of chronic  disease 03/11/2012  . Reflux esophagitis 03/11/2012  . 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 HIV (human immunodeficiency virus infection) 03/16/2012  . Arthralgia 05/20/2012  . 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 Vaginal Discharge 05/30/2012  . Laceration of ankle, right 11/18/2012   Resolved Ambulatory Problems    Diagnosis Date Noted  . No Resolved Ambulatory Problems   Past Medical History  Diagnosis Date  . Tuberculosis   . Pelvic pain      Review of Systems     Objective:   Physical Exam BP 125/75  Pulse 89  Temp(Src) 99.5 F (37.5 C) (Oral)  Ht 5\' 5"  (1.651 m)  Wt 141 lb 4 oz (64.071 kg)  BMI 23.51 kg/m2  LMP 02/07/2013 Physical Exam  Constitutional:  oriented to person, place, and time. Fatigue appearing and well-nourished. No distress.  HENT:  Mouth/Throat: Oropharynx is clear and moist. No oropharyngeal exudate.  Cardiovascular: Normal rate, regular rhythm and normal heart sounds. Exam reveals no gallop and no friction rub.  No murmur heard.  Pulmonary/Chest: Effort normal and breath sounds normal. No respiratory distress.  no wheezes.  Abdominal: Soft. Bowel sounds are normal. He exhibits no distension. There is no tenderness.  Lymphadenopathy:  no cervical adenopathy.  Skin: Skin is warm and dry. No rash noted. No erythema.       Assessment & Plan:   Likely viral infection causing influenza like illness = continue with supportive care, can  do tylenol for headache if ibuprofen is not working. Will check her labs today. Call back in 3 days to see if any improvement with supportive care.  Duke Salvia Drue Second MD MPH [facility] [phone]

## 2013-03-14 ENCOUNTER — Telehealth: Payer: Self-pay

## 2013-03-14 LAB — HIV-1 RNA QUANT-NO REFLEX-BLD
HIV 1 RNA Quant: <20 copies/mL (ref <20)
HIV-1 RNA Quant, Log: <1.3 {Log} (ref <1.30)

## 2013-03-14 NOTE — Telephone Encounter (Signed)
Pt calling regarding continued increased temperatures. She sates it is somewhere between 100-102 oral with tylenol and worse in the evenings.    Please advise.   [phone] phone   Isabel Freese Gorden Harms, RN

## 2013-03-15 NOTE — Telephone Encounter (Signed)
i will call her and have her come in on thursday

## 2013-03-30 ENCOUNTER — Other Ambulatory Visit: Payer: Medicaid Other

## 2013-04-18 ENCOUNTER — Ambulatory Visit: Payer: Medicaid Other | Admitting: Internal Medicine

## 2013-04-19 ENCOUNTER — Ambulatory Visit: Payer: Medicaid Other | Admitting: Internal Medicine

## 2013-04-29 ENCOUNTER — Telehealth: Payer: Self-pay | Admitting: *Deleted

## 2013-04-29 NOTE — Telephone Encounter (Signed)
Correct that she can have a letter. She is not to get a repeat ppd since she has finished treatment for active TB. She only needs to be screened for symptoms and may need cxr

## 2013-04-29 NOTE — Telephone Encounter (Signed)
Patient called requesting a letter for work, stating she can not take a ppd test due to previous positive. And that she has been treated and is ok to work, she has an appointment on 05/11/13. Kelsey Rodriguez

## 2013-05-03 ENCOUNTER — Ambulatory Visit (INDEPENDENT_AMBULATORY_CARE_PROVIDER_SITE_OTHER): Payer: Medicaid Other | Admitting: Internal Medicine

## 2013-05-03 ENCOUNTER — Ambulatory Visit: Payer: Self-pay

## 2013-05-03 ENCOUNTER — Encounter: Payer: Self-pay | Admitting: Internal Medicine

## 2013-05-03 VITALS — BP 124/78 | HR 88 | Temp 98.3°F | Wt 146.0 lb

## 2013-05-03 DIAGNOSIS — B20 Human immunodeficiency virus [HIV] disease: Secondary | ICD-10-CM

## 2013-05-03 DIAGNOSIS — R5383 Other fatigue: Secondary | ICD-10-CM

## 2013-05-03 DIAGNOSIS — R5381 Other malaise: Secondary | ICD-10-CM

## 2013-05-03 DIAGNOSIS — Z21 Asymptomatic human immunodeficiency virus [HIV] infection status: Secondary | ICD-10-CM

## 2013-05-03 DIAGNOSIS — D649 Anemia, unspecified: Secondary | ICD-10-CM

## 2013-05-03 MED ORDER — EMTRICITABINE-TENOFOVIR DF 200-300 MG PO TABS: 1.0000 | ORAL_TABLET | Freq: Every day | ORAL | Status: DC

## 2013-05-03 MED ORDER — RITONAVIR 100 MG PO TABS: 100.0000 mg | ORAL_TABLET | Freq: Every day | ORAL | Status: DC

## 2013-05-03 MED ORDER — DARUNAVIR ETHANOLATE 800 MG PO TABS: 800.0000 mg | ORAL_TABLET | Freq: Every day | ORAL | Status: DC

## 2013-05-03 NOTE — Progress Notes (Signed)
[facility] NOTE  RFV: routine follow up Subjective:    Patient ID: Kelsey Rodriguez, female    DOB: 13-Jun-1991, 22 y.o.   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 Has been off of antibiotics for the last 2 wks due lack of insurance. She is in the process of applying for ADAP. She is anticipated to getting it in 2 wks. 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  Ros: No fever, chills, nighsweats, or coughing. Weight is stable. Still reports fatigue after a day's worth of work.  Current Outpatient Prescriptions on File Prior to Visit  Medication Sig Dispense Refill  . Darunavir Ethanolate (PREZISTA) 800 MG tablet Take 1 tablet (800 mg total) by mouth daily with breakfast.  30 tablet  11  . emtricitabine-tenofovir (TRUVADA) 200-300 MG per tablet Take 1 tablet by mouth daily.  30 tablet  5  . ENSURE (ENSURE) Take 237 mLs by mouth 2 (two) times daily between meals.      [name] ibuprofen (ADVIL,MOTRIN) 800 MG tablet Take 1 tablet (800 mg total) by mouth every 8 (eight) hours as needed for pain.  60 tablet  1  . Norethindrone Acetate-Ethinyl Estradiol (JUNEL,LOESTRIN,MICROGESTIN) 1.5-30 MG-MCG tablet Take 1 tablet by mouth daily.  1 Package  11  . ritonavir (NORVIR) 100 MG TABS Take 1 tablet (100 mg total) by mouth daily.  30 tablet  11  . traMADol (ULTRAM) 50 MG tablet Take 1 tablet (50 mg total) by mouth every 6 (six) hours as needed for pain.  60 tablet  0   No current facility-administered medications on file prior to visit.    Active Ambulatory Problems    Diagnosis Date Noted  . Chest pain 03/11/2012  . Dysphagia 03/11/2012  . 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 Anemia of chronic disease 03/11/2012  . Reflux esophagitis 03/11/2012  . 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 HIV (human  immunodeficiency virus infection) 03/16/2012  . Arthralgia 05/20/2012  . 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 Vaginal Discharge 05/30/2012  . Laceration of ankle, right 11/18/2012   Resolved Ambulatory Problems    Diagnosis Date Noted  . No Resolved Ambulatory Problems   Past Medical History  Diagnosis Date  . Tuberculosis   . Pelvic pain    Social hx: unchanged, no smoking no alcohol. Family hx: unchanged since last reviewed   Review of Systems  Constitutional: + fatigue Negative for fever, chills, diaphoresis, activity change, appetite change,  and unexpected weight change.  HENT: Negative for congestion, sore throat, rhinorrhea, sneezing, trouble swallowing and sinus pressure.  Eyes: Negative for photophobia and visual disturbance.  Respiratory: Negative for cough, chest tightness, shortness of breath, wheezing and stridor.  Cardiovascular: Negative for chest pain, palpitations and leg swelling.  Gastrointestinal: Negative for nausea, vomiting, abdominal pain, diarrhea, constipation, blood in stool, abdominal distention and anal bleeding.  Genitourinary: Negative for dysuria, hematuria, flank pain and difficulty urinating.  Musculoskeletal: Negative for myalgias, back pain, joint swelling, arthralgias and gait problem.  Skin: Negative for color change, pallor, rash and wound.  Neurological: Negative for dizziness, tremors, weakness and light-headedness.  Hematological: Negative for adenopathy. Does not bruise/bleed easily.  Psychiatric/Behavioral: Negative for behavioral problems, confusion, sleep disturbance, dysphoric mood, decreased concentration and agitation.        Objective:   Physical Exam  BP 124/78  Pulse 88  Temp(Src) 98.3 F (36.8 C) (Oral)  Wt 146 lb (66.225 kg)  BMI 24.3 kg/m2  LMP 04/29/2013 Physical Exam  Constitutional:  oriented to person, place, and time.  appears well-developed and well-nourished. No distress.  HENT:  Mouth/Throat: Oropharynx is clear and moist.  No oropharyngeal exudate.  Cardiovascular: Normal rate, regular rhythm and normal heart sounds. Exam reveals no gallop and no friction rub.  No murmur heard.  Pulmonary/Chest: Effort normal and breath sounds normal. No respiratory distress. no wheezes.  Abdominal: Soft. Bowel sounds are normal. He exhibits no distension. There is no tenderness.  Lymphadenopathy: no cervical adenopathy.  Neurological: alert and oriented to person, place, and time.  Skin: Skin is warm and dry. No rash noted. No erythema.  Psychiatric:  normal mood and affect.  behavior is normal.       Assessment & Plan:  HIV = will give rx for truvada/DRV/r for ADAP applications.   Leukopenia = anc 1.2 on blood work for march. Has been that low before but appears lower than baseline. Will check cbc in 2 wks once insurance is approved  Anemia = increase iron supp to 325mg  BID.  TB treatment = finished, and now needs evaluation for return to work. Provided a letter for her since her questionnaire for TB symptoms are negative  rtc in 6-8 wks for next visit and blood work at that time

## 2013-05-12 ENCOUNTER — Other Ambulatory Visit: Payer: Self-pay | Admitting: *Deleted

## 2013-05-12 DIAGNOSIS — B20 Human immunodeficiency virus [HIV] disease: Secondary | ICD-10-CM

## 2013-05-12 MED ORDER — EMTRICITABINE-TENOFOVIR DF 200-300 MG PO TABS: 1.0000 | ORAL_TABLET | Freq: Every day | ORAL | Status: DC

## 2013-05-12 MED ORDER — RITONAVIR 100 MG PO TABS: 100.0000 mg | ORAL_TABLET | Freq: Every day | ORAL | Status: DC

## 2013-05-12 MED ORDER — DARUNAVIR ETHANOLATE 800 MG PO TABS: 800.0000 mg | ORAL_TABLET | Freq: Every day | ORAL | Status: DC

## 2013-05-30 ENCOUNTER — Encounter: Payer: Self-pay | Admitting: Internal Medicine

## 2013-05-30 ENCOUNTER — Ambulatory Visit (HOSPITAL_COMMUNITY)
Admission: RE | Admit: 2013-05-30 | Discharge: 2013-05-30 | Disposition: A | Discharge status: Home / Self Care | Payer: Self-pay | Source: Ambulatory Visit | Attending: Internal Medicine | Admitting: Internal Medicine

## 2013-05-30 ENCOUNTER — Ambulatory Visit (INDEPENDENT_AMBULATORY_CARE_PROVIDER_SITE_OTHER): Payer: Medicaid Other | Admitting: Internal Medicine

## 2013-05-30 VITALS — BP 119/77 | HR 80 | Temp 99.0°F | Ht 60.0 in | Wt 142.0 lb

## 2013-05-30 DIAGNOSIS — B20 Human immunodeficiency virus [HIV] disease: Secondary | ICD-10-CM

## 2013-05-30 DIAGNOSIS — S29011A Strain of muscle and tendon of front wall of thorax, initial encounter: Secondary | ICD-10-CM | POA: Insufficient documentation

## 2013-05-30 DIAGNOSIS — S29011D Strain of muscle and tendon of front wall of thorax, subsequent encounter: Secondary | ICD-10-CM

## 2013-05-30 DIAGNOSIS — Z21 Asymptomatic human immunodeficiency virus [HIV] infection status: Secondary | ICD-10-CM

## 2013-05-30 DIAGNOSIS — R079 Chest pain, unspecified: Secondary | ICD-10-CM | POA: Insufficient documentation

## 2013-05-30 DIAGNOSIS — Z5189 Encounter for other specified aftercare: Secondary | ICD-10-CM

## 2013-05-30 DIAGNOSIS — Z8611 Personal history of tuberculosis: Secondary | ICD-10-CM

## 2013-05-30 NOTE — Assessment & Plan Note (Signed)
Unfortunately has been off meds. She will restart tomorrow and I will check her labs in 3 weeks and she will follow up with her primary provider after that.

## 2013-05-30 NOTE — Progress Notes (Signed)
  Subjective:    Patient ID: Kelsey Rodriguez, female    DOB: 12-29-1990, 22 y.o.   MRN: [id number]  HPI She comes in for a work in visit.  She has unfortunately been off of her antiretroviral therapy.  She has been on Darunavir, Norvir and Truvada. She though has been off since losing her Medicaid. She did start her paperwork for the drug assistance program and was just approved today. She is going to the pharmacy today to pick up her medications. She has been off for 2 months. She also has a complaint of left sided chest pain and arm pain. This is associated with movement, is palpable and somewhat pleuritic. No associated nausea, diaphoresis or fever. No shortness of breath. No radiation. This has been off and on for about 2 weeks.   Review of Systems  Constitutional: Negative for fever, chills and unexpected weight change.  HENT: Negative for sore throat and trouble swallowing.   Respiratory: Negative for shortness of breath.   Cardiovascular: Positive for chest pain.  Gastrointestinal: Negative for nausea and diarrhea.  Skin: Negative for rash.  Neurological: Negative for dizziness and headaches.  Hematological: Negative for adenopathy.       Objective:   Physical Exam  Constitutional: She appears well-developed and well-nourished. No distress.  HENT:  Mouth/Throat: No oropharyngeal exudate.  Eyes: No scleral icterus.  Cardiovascular: Normal rate, regular rhythm and normal heart sounds.   No murmur heard. Pulmonary/Chest: Effort normal and breath sounds normal. No respiratory distress. She has no wheezes. She has no rales. She exhibits tenderness.  Lymphadenopathy:    She has no cervical adenopathy.  Skin: No rash noted.  Psychiatric: She has a normal mood and affect. Her behavior is normal.          Assessment & Plan:

## 2013-05-30 NOTE — Assessment & Plan Note (Signed)
No concerning signs and exam consistent with muscle wall strain. Will check a chest x-ray to be sure not pneumothorax or other more concerning feature to

## 2013-05-31 ENCOUNTER — Telehealth: Payer: Self-pay | Admitting: *Deleted

## 2013-05-31 NOTE — Telephone Encounter (Signed)
Left message to notify pt that her chest xray was normal.

## 2013-06-12 ENCOUNTER — Emergency Department (HOSPITAL_COMMUNITY)
Admission: EM | Admit: 2013-06-12 | Discharge: 2013-06-12 | Disposition: A | Discharge status: Home / Self Care | Payer: Self-pay | Attending: Emergency Medicine | Admitting: Emergency Medicine

## 2013-06-12 ENCOUNTER — Encounter (HOSPITAL_COMMUNITY): Payer: Self-pay | Admitting: Emergency Medicine

## 2013-06-12 ENCOUNTER — Emergency Department (HOSPITAL_COMMUNITY): Payer: Self-pay

## 2013-06-12 DIAGNOSIS — R42 Dizziness and giddiness: Secondary | ICD-10-CM | POA: Insufficient documentation

## 2013-06-12 DIAGNOSIS — Y929 Unspecified place or not applicable: Secondary | ICD-10-CM | POA: Insufficient documentation

## 2013-06-12 DIAGNOSIS — R05 Cough: Secondary | ICD-10-CM | POA: Insufficient documentation

## 2013-06-12 DIAGNOSIS — R0789 Other chest pain: Secondary | ICD-10-CM | POA: Insufficient documentation

## 2013-06-12 DIAGNOSIS — Z21 Asymptomatic human immunodeficiency virus [HIV] infection status: Secondary | ICD-10-CM | POA: Insufficient documentation

## 2013-06-12 DIAGNOSIS — Z79899 Other long term (current) drug therapy: Secondary | ICD-10-CM | POA: Insufficient documentation

## 2013-06-12 DIAGNOSIS — S23421A Sprain of chondrosternal joint, initial encounter: Secondary | ICD-10-CM | POA: Insufficient documentation

## 2013-06-12 DIAGNOSIS — S29011A Strain of muscle and tendon of front wall of thorax, initial encounter: Secondary | ICD-10-CM

## 2013-06-12 DIAGNOSIS — R059 Cough, unspecified: Secondary | ICD-10-CM | POA: Insufficient documentation

## 2013-06-12 DIAGNOSIS — X503XXA Overexertion from repetitive movements, initial encounter: Secondary | ICD-10-CM | POA: Insufficient documentation

## 2013-06-12 DIAGNOSIS — Z8611 Personal history of tuberculosis: Secondary | ICD-10-CM | POA: Insufficient documentation

## 2013-06-12 DIAGNOSIS — Y998 Other external cause status: Secondary | ICD-10-CM | POA: Insufficient documentation

## 2013-06-12 DIAGNOSIS — X500XXA Overexertion from strenuous movement or load, initial encounter: Secondary | ICD-10-CM | POA: Insufficient documentation

## 2013-06-12 LAB — CBC WITH DIFFERENTIAL/PLATELET
Basophils Absolute: 0 10*3/uL (ref 0.0–0.1)
Basophils Relative: 1 % (ref 0–1)
Eosinophils Absolute: 0.3 10*3/uL (ref 0.0–0.7)
Eosinophils Relative: 5 % (ref 0–5)
HCT: 32.1 % — ABNORMAL LOW (ref 36.0–46.0)
Hemoglobin: 10.7 g/dL — ABNORMAL LOW (ref 12.0–15.0)
Lymphocytes Relative: 38 % (ref 12–46)
Lymphs Abs: 2.1 10*3/uL (ref 0.7–4.0)
MCH: 27.5 pg (ref 26.0–34.0)
MCHC: 33.3 g/dL (ref 30.0–36.0)
MCV: 82.5 fL (ref 78.0–100.0)
Monocytes Absolute: 0.6 10*3/uL (ref 0.1–1.0)
Monocytes Relative: 11 % (ref 3–12)
Neutro Abs: 2.5 10*3/uL (ref 1.7–7.7)
Neutrophils Relative %: 46 % (ref 43–77)
Platelets: 138 10*3/uL — ABNORMAL LOW (ref 150–400)
RBC: 3.89 MIL/uL (ref 3.87–5.11)
RDW: 17.2 % — ABNORMAL HIGH (ref 11.5–15.5)
WBC: 5.6 10*3/uL (ref 4.0–10.5)

## 2013-06-12 LAB — BASIC METABOLIC PANEL
BUN: 11 mg/dL (ref 6–23)
CO2: 25 mEq/L (ref 19–32)
Calcium: 9.7 mg/dL (ref 8.4–10.5)
Chloride: 101 mEq/L (ref 96–112)
Creatinine, Ser: 0.83 mg/dL (ref 0.50–1.10)
GFR calc Af Amer: >90 mL/min (ref >90)
GFR calc non Af Amer: >90 mL/min (ref >90)
Glucose, Bld: 87 mg/dL (ref 70–99)
Potassium: 4 mEq/L (ref 3.5–5.1)
Sodium: 136 mEq/L (ref 135–145)

## 2013-06-12 LAB — TROPONIN I: Troponin I: <0.3 ng/mL (ref <0.30)

## 2013-06-12 LAB — POCT I-STAT TROPONIN I: Troponin i, poc: 0 ng/mL (ref 0.00–0.08)

## 2013-06-12 MED ORDER — NAPROXEN 375 MG PO TABS: 375.0000 mg | ORAL_TABLET | Freq: Two times a day (BID) | ORAL | Status: DC

## 2013-06-12 NOTE — ED Provider Notes (Signed)
Medical screening examination/treatment/procedure(s) were performed by non-physician practitioner and as supervising physician I was immediately available for consultation/collaboration.   Gwyneth Sprout, MD 06/12/13 2340

## 2013-06-12 NOTE — ED Provider Notes (Signed)
History    CSN: [id number] Arrival date & time 06/12/13  1638  First MD Initiated Contact with Patient 06/12/13 1922     Chief Complaint  Patient presents with  . Chest Pain   (Consider location/radiation/quality/duration/timing/severity/associated sxs/prior Treatment) HPI Comments: This is a 22 year old HIV-positive female who's had intermittent left chest discomfort with movement and deep respirations.  Turning her head, right or lifting her arm.  She was seen by her primary care physician.  Several days ago, had a negative.  Chest x-ray.  I was told not to worry about.  This.  She took ibuprofen on 2 separate occasions without relief of her discomfort.  There are no new symptoms.  No shortness of breath.  No diaphoresis, no nausea, no vomiting  Patient is a 22 y.o. female presenting with chest pain. The history is provided by the patient.  Chest Pain Pain location:  L chest Pain radiates to:  Does not radiate Pain radiates to the back: no   Pain severity:  Mild Onset quality:  Unable to specify Duration:  14 days Timing:  Intermittent Progression:  Unchanged Chronicity:  New Context: breathing, movement and raising an arm   Relieved by:  Nothing Worsened by:  Certain positions Associated symptoms: cough and dizziness   Associated symptoms: no anorexia, no anxiety, no back pain, no fever, no nausea, no palpitations, no shortness of breath and not vomiting   Risk factors comment:  HIV  Past Medical History  Diagnosis Date  . Tuberculosis   . HIV (human immunodeficiency virus infection) 02/2012  . Pelvic pain    Past Surgical History  Procedure Laterality Date  . Esophagogastroduodenoscopy  03/11/2012    Procedure: ESOPHAGOGASTRODUODENOSCOPY (EGD);  Surgeon: Hart Carwin, MD;  Location: [facility] ENDOSCOPY;  Service: Endoscopy;  Laterality: N/A;  . Appendectomy  ~ 2000  . Lung biopsy  02/2012  . Dilation and curettage of uterus  2008   No family history on file. History   Substance Use Topics  . Smoking status: Never Smoker   . Smokeless tobacco: Never Used  . Alcohol Use: No     Comment: socially   OB History   Grav Para Term Preterm Abortions TAB SAB Ect Mult Living   1 0 0 0 1 0 1 0 0 0      Review of Systems  Constitutional: Negative for fever.  Respiratory: Positive for cough. Negative for shortness of breath.   Cardiovascular: Positive for chest pain. Negative for palpitations.  Gastrointestinal: Negative for nausea, vomiting and anorexia.  Genitourinary: Negative for dysuria.  Musculoskeletal: Negative for myalgias and back pain.  Skin: Negative for rash and wound.  Neurological: Positive for dizziness.  All other systems reviewed and are negative.    Allergies  Review of patient's allergies indicates no known allergies.  Home Medications   Current Outpatient Rx  Name  Route  Sig  Dispense  Refill  . Darunavir Ethanolate (PREZISTA) 800 MG tablet   Oral   Take 800 mg by mouth daily.         [name] emtricitabine-tenofovir (TRUVADA) 200-300 MG per tablet   Oral   Take 1 tablet by mouth daily.         . ferrous sulfate 325 (65 FE) MG tablet   Oral   Take 325 mg by mouth 2 (two) times daily.         [name] ibuprofen (ADVIL,MOTRIN) 800 MG tablet   Oral   Take 800 mg by mouth every 8 (  eight) hours as needed for pain.         . ritonavir (NORVIR) 100 MG capsule   Oral   Take 100 mg by mouth daily.         . naproxen (NAPROSYN) 375 MG tablet   Oral   Take 1 tablet (375 mg total) by mouth 2 (two) times daily.   20 tablet   0    BP 117/78  Pulse 83  Temp(Src) 98.5 F (36.9 C) (Oral)  Resp 16  SpO2 100%  LMP 05/29/2013 Physical Exam  Nursing note and vitals reviewed. Constitutional: She appears well-developed and well-nourished. No distress.  HENT:  Head: Normocephalic and atraumatic.  Nose: Nose normal.  Mouth/Throat: Oropharynx is clear and moist.  Eyes: Pupils are equal, round, and reactive to light.  Neck:  Normal range of motion.  Cardiovascular: Normal rate and regular rhythm.   Pulmonary/Chest: Effort normal and breath sounds normal.  Abdominal: Soft.  Musculoskeletal:  Reproducible pain with turning her head to the right lifting her left arm taking a deep breath  Neurological: She is alert.  Skin: Skin is warm. No rash noted. No erythema. No pallor.    ED Course  Procedures (including critical care time) Labs Reviewed  CBC WITH DIFFERENTIAL - Abnormal; Notable for the following:    Hemoglobin 10.7 (*)    HCT 32.1 (*)    RDW 17.2 (*)    Platelets 138 (*)    All other components within normal limits  TROPONIN I  BASIC METABOLIC PANEL  POCT I-STAT TROPONIN I   Dg Chest 2 View  06/12/2013   *RADIOLOGY REPORT*  Clinical Data: Shortness of breath.  CHEST - 2 VIEW  Comparison: PA and lateral chest 05/30/2013.  Findings: Lungs are clear.  Heart size is normal.  No pneumothorax or pleural fluid.  IMPRESSION: No acute disease.   Original Report Authenticated By: Holley Dexter, M.D.   1. Muscle strain of chest wall, initial encounter     MDM   Will check CBC i-STAT, and 1, troponin, obtain EKG, and chest x-ray.  I doubt highly that this is cardiac in nature  Arman Filter, NP 06/12/13 2216

## 2013-06-12 NOTE — ED Notes (Signed)
Pt reports 2 weeks of intermittent left sided chest pain that radiates to upper back and neck. Pt reports shortness of breath, dizziness yesterday and intermittent nausea.

## 2013-06-12 NOTE — ED Notes (Signed)
Pt st's she has had pain in left chest off and on x's 2 weeks.  St's when she coughs, clears throat or takes a deep breath it reproduces the pain.  Pt denies nausea or vomiting.  Pt also st's turning her head produces pain in left chest.

## 2013-06-14 ENCOUNTER — Ambulatory Visit (INDEPENDENT_AMBULATORY_CARE_PROVIDER_SITE_OTHER): Payer: Medicaid Other | Admitting: Internal Medicine

## 2013-06-14 ENCOUNTER — Encounter: Payer: Self-pay | Admitting: Internal Medicine

## 2013-06-14 VITALS — BP 133/75 | HR 85 | Temp 99.2°F | Wt 138.0 lb

## 2013-06-14 DIAGNOSIS — IMO0002 Reserved for concepts with insufficient information to code with codable children: Secondary | ICD-10-CM

## 2013-06-14 DIAGNOSIS — Z21 Asymptomatic human immunodeficiency virus [HIV] infection status: Secondary | ICD-10-CM

## 2013-06-14 DIAGNOSIS — B20 Human immunodeficiency virus [HIV] disease: Secondary | ICD-10-CM

## 2013-06-14 DIAGNOSIS — S29011S Strain of muscle and tendon of front wall of thorax, sequela: Secondary | ICD-10-CM

## 2013-06-14 NOTE — Progress Notes (Signed)
[facility] NOTE  RFV: routine  Subjective:    Patient ID: Kelsey Rodriguez, female    DOB: Apr 26, 1991, 22 y.o.   MRN: [id number]  HPI 22yo F with HIV-TB, cd 4 count of 220/VL <20, truvada/DRVr, back on HIV meds after adap lapse. She states that she is concerned about chest wall discomfort where she went to the ED for evaluation, received NSAIDs to treat pain. She states that it is better. No missing doses of HIV meds. 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  Current Outpatient Prescriptions on File Prior to Visit  Medication Sig Dispense Refill  . Darunavir Ethanolate (PREZISTA) 800 MG tablet Take 800 mg by mouth daily.      [name] emtricitabine-tenofovir (TRUVADA) 200-300 MG per tablet Take 1 tablet by mouth daily.      . ferrous sulfate 325 (65 FE) MG tablet Take 325 mg by mouth 2 (two) times daily.      [name] ibuprofen (ADVIL,MOTRIN) 800 MG tablet Take 800 mg by mouth every 8 (eight) hours as needed for pain.      . naproxen (NAPROSYN) 375 MG tablet Take 1 tablet (375 mg total) by mouth 2 (two) times daily.  20 tablet  0  . ritonavir (NORVIR) 100 MG capsule Take 100 mg by mouth daily.       No current facility-administered medications on file prior to visit.      Review of Systems     Objective:   Physical Exam BP 133/75  Pulse 85  Temp(Src) 99.2 F (37.3 C) (Oral)  Wt 138 lb (62.596 kg)  BMI 26.95 kg/m2  LMP 05/29/2013 gen = a xo by 3 in NAD HEENT = OP clear, no thrush, Lymph = no cervical LAD Chest wall = no palpable reproducible pain on palpation pulm = ctab, no w/c/r Cardiac = no g/m/r/. Normal s1, s2 Ext= no c/c/e       Assessment & Plan:  rtc in 10 days

## 2013-06-23 ENCOUNTER — Encounter: Payer: Self-pay | Admitting: Internal Medicine

## 2013-06-23 ENCOUNTER — Ambulatory Visit (INDEPENDENT_AMBULATORY_CARE_PROVIDER_SITE_OTHER): Payer: Medicaid Other | Admitting: Internal Medicine

## 2013-06-23 VITALS — BP 115/73 | HR 91 | Temp 98.7°F | Wt 137.0 lb

## 2013-06-23 DIAGNOSIS — T148XXA Other injury of unspecified body region, initial encounter: Secondary | ICD-10-CM

## 2013-06-23 DIAGNOSIS — B20 Human immunodeficiency virus [HIV] disease: Secondary | ICD-10-CM

## 2013-06-23 LAB — CBC WITH DIFFERENTIAL/PLATELET
Basophils Absolute: 0 10*3/uL (ref 0.0–0.1)
Basophils Relative: 0 % (ref 0–1)
Eosinophils Absolute: 0.4 10*3/uL (ref 0.0–0.7)
Eosinophils Relative: 9 % — ABNORMAL HIGH (ref 0–5)
HCT: 31.8 % — ABNORMAL LOW (ref 36.0–46.0)
Hemoglobin: 11.1 g/dL — ABNORMAL LOW (ref 12.0–15.0)
Lymphocytes Relative: 28 % (ref 12–46)
Lymphs Abs: 1.4 10*3/uL (ref 0.7–4.0)
MCH: 28.5 pg (ref 26.0–34.0)
MCHC: 34.9 g/dL (ref 30.0–36.0)
MCV: 81.5 fL (ref 78.0–100.0)
Monocytes Absolute: 0.6 10*3/uL (ref 0.1–1.0)
Monocytes Relative: 12 % (ref 3–12)
Neutro Abs: 2.5 10*3/uL (ref 1.7–7.7)
Neutrophils Relative %: 51 % (ref 43–77)
Platelets: 152 10*3/uL (ref 150–400)
RBC: 3.9 MIL/uL (ref 3.87–5.11)
RDW: 18.6 % — ABNORMAL HIGH (ref 11.5–15.5)
WBC: 4.9 10*3/uL (ref 4.0–10.5)

## 2013-06-23 LAB — COMPREHENSIVE METABOLIC PANEL
ALT: 11 U/L (ref 0–35)
AST: 15 U/L (ref 0–37)
Albumin: 4.2 g/dL (ref 3.5–5.2)
Alkaline Phosphatase: 77 U/L (ref 39–117)
BUN: 7 mg/dL (ref 6–23)
CO2: 24 mEq/L (ref 19–32)
Calcium: 9.7 mg/dL (ref 8.4–10.5)
Chloride: 105 mEq/L (ref 96–112)
Creat: 0.76 mg/dL (ref 0.50–1.10)
Glucose, Bld: 100 mg/dL — ABNORMAL HIGH (ref 70–99)
Potassium: 4 mEq/L (ref 3.5–5.3)
Sodium: 138 mEq/L (ref 135–145)
Total Bilirubin: 0.3 mg/dL (ref 0.3–1.2)
Total Protein: 8 g/dL (ref 6.0–8.3)

## 2013-06-23 MED ORDER — TRAMADOL HCL 50 MG PO TABS: 50.0000 mg | ORAL_TABLET | Freq: Four times a day (QID) | ORAL | Status: DC | PRN

## 2013-06-23 NOTE — Progress Notes (Signed)
[facility] NOTE  RFV: routine Subjective:    Patient ID: Kelsey Rodriguez, female    DOB: 04/15/1991, 22 y.o.   MRN: [id number]  HPI 22yo F with HIV-TB co-infection, cd 4 count of 220/VL <20, truvada/DRVr, back on HIV meds after adap lapse.doing well. Her MSK chest wall strain is doing much better; she treated with old pain Rx, tramadol  3 tablets over a course of a few days which pain is now resolved. No fever, chills, nightsweats. No missing doses.  Has occ. Headache she treates with ibuprofen    Current Outpatient Prescriptions on File Prior to Visit  Medication Sig Dispense Refill  . Darunavir Ethanolate (PREZISTA) 800 MG tablet Take 800 mg by mouth daily.      [name] emtricitabine-tenofovir (TRUVADA) 200-300 MG per tablet Take 1 tablet by mouth daily.      . ferrous sulfate 325 (65 FE) MG tablet Take 325 mg by mouth 2 (two) times daily.      [name] ibuprofen (ADVIL,MOTRIN) 800 MG tablet Take 800 mg by mouth every 8 (eight) hours as needed for pain.      . naproxen (NAPROSYN) 375 MG tablet Take 1 tablet (375 mg total) by mouth 2 (two) times daily.  20 tablet  0  . ritonavir (NORVIR) 100 MG capsule Take 100 mg by mouth daily.       No current facility-administered medications on file prior to visit.   Active Ambulatory Problems    Diagnosis Date Noted  . Chest pain 03/11/2012  . Dysphagia 03/11/2012  . 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 Anemia of chronic disease 03/11/2012  . Reflux esophagitis 03/11/2012  . 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 HIV (human immunodeficiency virus infection) 03/16/2012  . Arthralgia 05/20/2012  . 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 Vaginal Discharge 05/30/2012  . Laceration of ankle, right 11/18/2012  . Muscle strain of chest wall 05/30/2013   Resolved Ambulatory Problems    Diagnosis Date Noted  . No Resolved Ambulatory Problems   Past Medical History  Diagnosis Date  . Tuberculosis   . Pelvic pain     Review of Systems 12  point ROS is negative, except for HA.    Objective:   Physical Exam BP 115/73  Pulse 91  Temp(Src) 98.7 F (37.1 C) (Oral)  Wt 137 lb (62.143 kg)  BMI 26.76 kg/m2  LMP 05/29/2013 Physical Exam  Constitutional: o riented to person, place, and time.  appears well-developed and well-nourished. No distress.  HENT:  Mouth/Throat: Oropharynx is clear and moist. No oropharyngeal exudate.  Cardiovascular: Normal rate, regular rhythm and normal heart sounds. Exam reveals no gallop and no friction rub.  No murmur heard.  Pulmonary/Chest: Effort normal and breath sounds normal. No respiratory distress. He has no wheezes.  Abdominal: Soft. Bowel sounds are normal. He exhibits no distension. There is no tenderness.  Lymphadenopathy:  no cervical adenopathy.  Skin: Skin is warm and dry. No rash noted. No erythema.  Psychiatric:  a normal mood and affect. behavior is normal.         Assessment & Plan:  HIV = will get cd 4 count and viral load since she has been back on HIV meds x 2-3 months  Headaches = resolved with ibuprofen 800mg . Recommended not to overuse due to risk of NSAID rebound HA  msk pain = tramadol 50mg  PRN #[id number]  Refer to women's support group

## 2013-06-24 LAB — HIV-1 RNA QUANT-NO REFLEX-BLD
HIV 1 RNA Quant: 4231 copies/mL — ABNORMAL HIGH (ref <20)
HIV-1 RNA Quant, Log: 3.63 {Log} — ABNORMAL HIGH (ref <1.30)

## 2013-06-24 LAB — T-HELPER CELL (CD4) - (RCID CLINIC ONLY)
CD4 % Helper T Cell: 17 % — ABNORMAL LOW (ref 33–55)
CD4 T Cell Abs: 240 uL — ABNORMAL LOW (ref 400–2700)

## 2013-07-04 ENCOUNTER — Telehealth: Payer: Self-pay

## 2013-07-04 ENCOUNTER — Other Ambulatory Visit: Payer: Self-pay | Admitting: Licensed Clinical Social Worker

## 2013-07-04 DIAGNOSIS — M549 Dorsalgia, unspecified: Secondary | ICD-10-CM

## 2013-07-04 MED ORDER — HYDROCODONE-ACETAMINOPHEN 5-325 MG PO TABS: 1.0000 | ORAL_TABLET | Freq: Four times a day (QID) | ORAL | Status: DC | PRN

## 2013-07-04 NOTE — Telephone Encounter (Signed)
Patient states pain medication given at last office visit is not working.   Her back and chest pain are still the same without any relief. Please advise .  Laurell Josephs, RN

## 2013-07-07 ENCOUNTER — Inpatient Hospital Stay (HOSPITAL_COMMUNITY): Payer: Medicaid Other

## 2013-07-07 ENCOUNTER — Inpatient Hospital Stay (HOSPITAL_COMMUNITY)
Admission: AD | Admit: 2013-07-07 | Discharge: 2013-07-11 | DRG: 975 | Disposition: A | Discharge status: Home / Self Care | Payer: Medicaid Other | Source: Ambulatory Visit | Attending: Internal Medicine | Admitting: Internal Medicine

## 2013-07-07 ENCOUNTER — Encounter: Payer: Self-pay | Admitting: Internal Medicine

## 2013-07-07 ENCOUNTER — Ambulatory Visit (INDEPENDENT_AMBULATORY_CARE_PROVIDER_SITE_OTHER): Payer: Medicaid Other | Admitting: Internal Medicine

## 2013-07-07 VITALS — BP 124/79 | HR 103 | Temp 99.7°F | Wt 136.0 lb

## 2013-07-07 DIAGNOSIS — R509 Fever, unspecified: Secondary | ICD-10-CM

## 2013-07-07 DIAGNOSIS — A872 Lymphocytic choriomeningitis: Secondary | ICD-10-CM

## 2013-07-07 DIAGNOSIS — G0491 Myelitis, unspecified: Secondary | ICD-10-CM | POA: Diagnosis present

## 2013-07-07 DIAGNOSIS — M542 Cervicalgia: Secondary | ICD-10-CM | POA: Diagnosis present

## 2013-07-07 DIAGNOSIS — Z8611 Personal history of tuberculosis: Secondary | ICD-10-CM

## 2013-07-07 DIAGNOSIS — G0490 Encephalitis and encephalomyelitis, unspecified: Secondary | ICD-10-CM | POA: Diagnosis present

## 2013-07-07 DIAGNOSIS — D509 Iron deficiency anemia, unspecified: Secondary | ICD-10-CM | POA: Diagnosis present

## 2013-07-07 DIAGNOSIS — R911 Solitary pulmonary nodule: Secondary | ICD-10-CM | POA: Diagnosis present

## 2013-07-07 DIAGNOSIS — A154 Tuberculosis of intrathoracic lymph nodes: Secondary | ICD-10-CM | POA: Diagnosis present

## 2013-07-07 DIAGNOSIS — R079 Chest pain, unspecified: Secondary | ICD-10-CM | POA: Diagnosis present

## 2013-07-07 DIAGNOSIS — M2550 Pain in unspecified joint: Secondary | ICD-10-CM

## 2013-07-07 DIAGNOSIS — Z21 Asymptomatic human immunodeficiency virus [HIV] infection status: Secondary | ICD-10-CM

## 2013-07-07 DIAGNOSIS — D638 Anemia in other chronic diseases classified elsewhere: Secondary | ICD-10-CM | POA: Diagnosis present

## 2013-07-07 DIAGNOSIS — M549 Dorsalgia, unspecified: Secondary | ICD-10-CM

## 2013-07-07 DIAGNOSIS — A150 Tuberculosis of lung: Secondary | ICD-10-CM

## 2013-07-07 DIAGNOSIS — R291 Meningismus: Secondary | ICD-10-CM

## 2013-07-07 DIAGNOSIS — J189 Pneumonia, unspecified organism: Secondary | ICD-10-CM | POA: Diagnosis present

## 2013-07-07 DIAGNOSIS — B20 Human immunodeficiency virus [HIV] disease: Principal | ICD-10-CM | POA: Diagnosis present

## 2013-07-07 HISTORY — DX: Lymphocytic choriomeningitis: A87.2

## 2013-07-07 LAB — COMPREHENSIVE METABOLIC PANEL
ALT: 9 U/L (ref 0–35)
AST: 17 U/L (ref 0–37)
Albumin: 3.7 g/dL (ref 3.5–5.2)
Alkaline Phosphatase: 91 U/L (ref 39–117)
BUN: 10 mg/dL (ref 6–23)
CO2: 26 mEq/L (ref 19–32)
Calcium: 9.3 mg/dL (ref 8.4–10.5)
Chloride: 101 mEq/L (ref 96–112)
Creatinine, Ser: 0.7 mg/dL (ref 0.50–1.10)
GFR calc Af Amer: >90 mL/min (ref >90)
GFR calc non Af Amer: >90 mL/min (ref >90)
Glucose, Bld: 84 mg/dL (ref 70–99)
Potassium: 4.2 mEq/L (ref 3.5–5.1)
Sodium: 136 mEq/L (ref 135–145)
Total Bilirubin: 0.2 mg/dL — ABNORMAL LOW (ref 0.3–1.2)
Total Protein: 8.2 g/dL (ref 6.0–8.3)

## 2013-07-07 LAB — CBC
HCT: 33.1 % — ABNORMAL LOW (ref 36.0–46.0)
Hemoglobin: 10.9 g/dL — ABNORMAL LOW (ref 12.0–15.0)
MCH: 28 pg (ref 26.0–34.0)
MCHC: 32.9 g/dL (ref 30.0–36.0)
MCV: 85.1 fL (ref 78.0–100.0)
Platelets: 121 10*3/uL — ABNORMAL LOW (ref 150–400)
RBC: 3.89 MIL/uL (ref 3.87–5.11)
RDW: 17.4 % — ABNORMAL HIGH (ref 11.5–15.5)
WBC: 3.7 10*3/uL — ABNORMAL LOW (ref 4.0–10.5)

## 2013-07-07 LAB — TROPONIN I
Troponin I: <0.3 ng/mL (ref <0.30)
Troponin I: <0.3 ng/mL (ref <0.30)

## 2013-07-07 MED ORDER — OXYCODONE HCL 5 MG PO TABS
5.0000 mg | ORAL_TABLET | ORAL | Status: DC | PRN
  Administered 2013-07-08 – 2013-07-09 (×5): 5 mg via ORAL
  Filled 2013-07-07 (×6): qty 1

## 2013-07-07 MED ORDER — RITONAVIR 100 MG PO CAPS
100.0000 mg | ORAL_CAPSULE | Freq: Every day | ORAL | Status: DC
  Administered 2013-07-08 – 2013-07-11 (×4): 100 mg via ORAL
  Filled 2013-07-07 (×7): qty 1

## 2013-07-07 MED ORDER — EMTRICITABINE-TENOFOVIR DF 200-300 MG PO TABS
1.0000 | ORAL_TABLET | Freq: Every day | ORAL | Status: DC
  Administered 2013-07-08 – 2013-07-11 (×4): 1 via ORAL
  Filled 2013-07-07 (×5): qty 1

## 2013-07-07 MED ORDER — ENOXAPARIN SODIUM 40 MG/0.4ML ~~LOC~~ SOLN
40.0000 mg | SUBCUTANEOUS | Status: DC
  Filled 2013-07-07: qty 0.4

## 2013-07-07 MED ORDER — DARUNAVIR ETHANOLATE 800 MG PO TABS
800.0000 mg | ORAL_TABLET | Freq: Every day | ORAL | Status: DC
  Administered 2013-07-08 – 2013-07-11 (×4): 800 mg via ORAL
  Filled 2013-07-07 (×7): qty 1

## 2013-07-07 NOTE — H&P (Signed)
Date: 07/07/2013               Patient Name:  Kelsey Rodriguez MRN: [id number]  DOB: [birth year] Age / Sex: 22 y.o., female   PCP: Gardiner Barefoot, MD              Medical Service: Internal Medicine Teaching Service              Attending Physician: Dr. Rocco Serene, MD    First Contact: Heywood Iles, MS4 Pager: [phone]  Second Contact: Dr. 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   Six days ago, patient noted onset of central hest pain, sometimes worse with inspiration, different from a muscle strain she has had in the past. 2-3 days later, she started having fevers to 102F which responded to ibuprofen and headache that begins at the back of her head and radiates up bitemporally. Flexing her neck causes pain to go down her back. Overall, she feels weak. No nausea/vomiting, abdominal pain, diarrhea, swelling, changes in vision, weight loss, bug bites. Denies any recent sick contacts or recent travel.  Per record, she is followed in the ID clinic by Dr. Drue Second. She was admitted in March/April 2013 and diagnosed with HIV in the setting of pulmonary TB and treated for six months with rifampin, isoniazid, pyrazinamide, ethambutol. Patient is currently on emtricitabine, darunavir, and ritonavir though sometimes non-adherent to her antiviral therapy.     Meds: Current Facility-Administered Medications  Medication Dose Route Frequency Provider Last Rate Last Dose  . Darunavir Ethanolate (PREZISTA) tablet 800 mg  800 mg Oral Q breakfast Judie Bonus, MD      . emtricitabine-tenofovir (TRUVADA) 200-300 MG per tablet 1 tablet  1 tablet  Oral Daily Judie Bonus, MD      . enoxaparin (LOVENOX) injection 40 mg  40 mg Subcutaneous Q24H Judie Bonus, MD      . oxyCODONE (Oxy IR/ROXICODONE) immediate release tablet 5 mg  5 mg Oral Q4H PRN Judie Bonus, MD      . ritonavir (NORVIR) capsule 100 mg  100 mg Oral Q breakfast Judie Bonus, MD        Allergies: Allergies as of 07/07/2013  . (No Known Allergies)   Past Medical History  Diagnosis Date  . Tuberculosis   . HIV (human immunodeficiency virus infection) 02/2012  . Pelvic pain    Past Surgical History  Procedure Laterality Date  . Esophagogastroduodenoscopy  03/11/2012    Procedure: ESOPHAGOGASTRODUODENOSCOPY (EGD);  Surgeon: Hart Carwin, MD;  Location: [facility] ENDOSCOPY;  Service: Endoscopy;  Laterality: N/A;  . Appendectomy  ~ 2000  . Lung biopsy  02/2012  . Dilation and curettage of uterus  2008   No family history on file. History   Social History  . Marital Status: Single    Spouse Name: N/A    Number of Children: N/A  . Years of Education: N/A   Occupational History  . Not on file.   Social History Main Topics  . Smoking status: Never Smoker   . Smokeless tobacco: Never Used  . Alcohol  Use: No     Comment: socially  . Drug Use: No  . Sexually Active: Not Currently -- Female partner(s)    Birth Control/ Protection: None     Comment: pt. given condoms   Other Topics Concern  . Not on file   Social History Narrative  . No narrative on file    Review of Systems: As per HPI.  Physical Exam: Blood pressure 120/78, pulse 86, temperature 99.3 F (37.4 C), temperature source Oral, resp. rate 19, SpO2 100.00%.  Physical Exam General: alert, cooperative, and in no apparent distress HEENT: pupils equal round and reactive to light, vision grossly intact, oropharynx clear and non-erythematous  Neck: supple, no lymphadenopathy, JVD, or carotid bruits Lungs: clear to ascultation bilaterally, normal work of respiration, no wheezes,  rales, ronchi Heart: regular rate and rhythm, no murmurs, gallops, or rubs Abdomen: soft, non-tender, non-distended, normal bowel sounds Extremities: 2+ DP/PT pulses bilaterally, no cyanosis, clubbing, or edema. nuchal regidity Neurologic: alert & oriented X3, strength grossly intact, sensation intact to light touch  Lab results: BMET    Component Value Date/Time   NA 136 07/07/2013 1703   K 4.2 07/07/2013 1703   CL 101 07/07/2013 1703   CO2 26 07/07/2013 1703   GLUCOSE 84 07/07/2013 1703   BUN 10 07/07/2013 1703   CREATININE 0.70 07/07/2013 1703   CREATININE 0.76 06/23/2013 1103   CALCIUM 9.3 07/07/2013 1703   GFRNONAA >90 07/07/2013 1703   GFRAA >90 07/07/2013 1703   CBC    Component Value Date/Time   WBC 3.7* 07/07/2013 1703   RBC 3.89 07/07/2013 1703   HGB 10.9* 07/07/2013 1703   HCT 33.1* 07/07/2013 1703   PLT 121* 07/07/2013 1703   MCV 85.1 07/07/2013 1703   MCH 28.0 07/07/2013 1703   MCHC 32.9 07/07/2013 1703   RDW 17.4* 07/07/2013 1703   LYMPHSABS 1.4 06/23/2013 1103   MONOABS 0.6 06/23/2013 1103   EOSABS 0.4 06/23/2013 1103   BASOSABS 0.0 06/23/2013 1103   Other results: EKG: Normal sinus rate and rhythm.    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 Differential includes meningitis of bacterial, viral, or fungal etiology; migraines, and neurosyphilis. Denies personal or family history of migraines as well as recent sexual activity. Past RPR in June/July 2013 were negative. -Admit as inpatient with droplet precautions until bacterial meningitis can be ruled out -Neuro checks q6h -Blood cultures x2 pending -Perform LP for CSF analysis and to check HSV, enterovirus, cryptococcal Ag  #Chest pain: Differential includes pneumonia, post-TB  inflammatory changes, ACS. Less likely ACS given inspiratory nature of pain and no known cardiac risk factors.   -Rule out ACS with troponins & EKG -Order CXR and compare with prior to assess for changes   #HIV: Continue her home meds.   #Disposition: Pending improvement of her health but possibly 3-4 days.  This is a Psychologist, occupational Note.  The care of the patient was discussed with Dr. Suszanne Conners and the assessment and plan was formulated with their assistance.  Please see their note for official documentation of the patient encounter.   Signed: Beather Arbour, Med Student 07/07/2013, 4:52 PM

## 2013-07-07 NOTE — H&P (Addendum)
Date: 07/07/2013               Patient Name:  Kelsey Rodriguez MRN: [id number]  DOB: 08/12/1991 Age / Sex: 22 y.o., female   PCP: Gardiner Barefoot, MD              Medical Service: Internal Medicine Teaching Service     I have reviewed the note by Heywood Iles MS 4 and was present during the interview and physical exam.  Please see below for findings, assessment, and plan.  Chief Complaint: chest pain, neck/back pain  History of Present Illness:  The patient is a 22 YO female who was diagnosed with HIV and TB approximately 1 year ago. She was treated for TB which was parnchymal and mediastinal and started on HAART.Last CD4 count was 240 and viral load 4200. She has had off and on chest pain since that time. She notes 1 week of worsening chest pain in the middle of her chest which occasionally wraps around her chest. She states that it is worse with breathing or activity and nothing makes it better. She did try tramadol for the chest pain which did not work so she tried some vicodin which helps for several hours then wears off. She has also been having some fevers since Monday (up to 102) and new neck pain. The neck pain is worsened with bending over and she states yesterday she was unable to bend over. The neck pain also radiates into her head and her lower back. She denies nausea or vomiting. She denies diarrhea or constipation. She denies light or sound sensitivity. She has had some headaches caused by radiation of the neck pain up into her head. 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 She does not smoke, drink alcohol, or do any illicit substances.   Meds: Current Facility-Administered Medications  Medication Dose Route Frequency Provider Last Rate Last Dose  . Darunavir Ethanolate (PREZISTA) tablet 800 mg  800 mg Oral Q breakfast Judie Bonus, MD      . emtricitabine-tenofovir (TRUVADA) 200-300 MG per tablet 1  tablet  1 tablet Oral Daily Judie Bonus, MD      . oxyCODONE (Oxy IR/ROXICODONE) immediate release tablet 5 mg  5 mg Oral Q4H PRN Judie Bonus, MD      . ritonavir (NORVIR) capsule 100 mg  100 mg Oral Q breakfast Judie Bonus, MD        Allergies: Allergies as of 07/07/2013  . (No Known Allergies)    Past Medical History: Medical Student note reviewed  Family History: Medical Student note reviewed  Social History: Medical Student note reviewed  Surgical History: Medical Student note reviewed  Review of System: Medical Student note reviewed  Physical Exam: Blood pressure 120/78, pulse 86, temperature 99.3 F (37.4 C), temperature source Oral, resp. rate 19, SpO2 100.00%. General: resting in bed, pleasant HEENT: PERRL, EOMI, no scleral icterus, fundoscopic exam reveals no papilledema Cardiac: S1 S2 heard, no murmurs Pulm: tenderness to palpation over the mediastinum, pain with deep inspiration, moving normal amounts of air, no obvious crackles or rales heard Abd: soft, nontender, nondistended, BS present Ext: warm and well perfused, no pedal edema Neuro: alert and oriented X3, cranial nerves II-XII grossly intact, strength 5/5 upper and lower extremities, no decreased sensation in his lower or upper extremities bilaterally  Labs: Reviewed as noted in the Electronic  Record  Imaging: Reviewed as noted in the Electronic Record  Other results: EKG: ordered  Assessment & Plan by Problem:  Fevers - Unclear etiology at this time. Will rule out pneumonia with CXR, blood cultures times 2, LP to rule out meningitis. Will hold tylenol so we can see fevers that happen.  -CXR -LP -Blood cultures times 2 -Hold antibiotics/antivirals until LP  Nuchal rigidity - Concerning for viral or atypical meningitis. Clinically is stable at this time without acute neurological findings. Attempted LP this evening and was unsuccessful due to patient discomfort and anxiety.  Will order head CT non-contrast to rule out mass effect (given her HIV) and obtain LP per IR tomorrow. Will hold antibiotics at this time. Blood cultures times 2. Low threshold to start Ceftriaxone 2 g q 12, vancomcyin, Acyclovir.  -LP (check gram stain, glucose, protein, cell count, culture, Cryptococcal antigen, HSV, enterovirus pcr)  -Check serum cryptococcal antigen -Blood cultures times 2 -Observe closely with neuro checks  HIV (human immunodeficiency virus infection) - Continue prezista, truvada, norvir. Last CD4 240 with viral load 4200.  Chest pain - There are numerous etiologies for her chest pain including: pneumonia, mediastinitis, post-tubercular changes, musckuloskeletal, cardiac (although low likelihood), GERD (also seems unlikely and no other signs to indicate this is the cause), aortic dissection (low likelihood), PE (low likelihood). Will start by ordering chest x-ray to rule out acute pneumonia or change (last CXR 06/12/13). Can consider getting CT chest with and without contrast to further characterize.  -CXR  DVT ppx - Will use SCDs given LP tomorrow with IR  Dispo: Disposition is deferred at this time, awaiting improvement of current medical problems. Anticipated discharge in approximately 2-3 day(s).   The patient does have a current PCP Gardiner Barefoot, MD) and does not need an [facility] hospital follow-up appointment after discharge.  The patient does have transportation limitations that hinder transportation to clinic appointments.  Signed: Judie Bonus, MD 07/07/2013, 9:03 PM

## 2013-07-07 NOTE — Progress Notes (Signed)
[facility] CLINIC NOTE  RFV: sick visit with CC: fevers and headache  Subjective:    Patient ID: Kelsey Rodriguez, female    DOB: 1991-07-05, 22 y.o.   MRN: [id number]  HPI  22yo F, with HIV diagnosed in the setting of pulmonary & mediastinal mTB finished treatment , CD 4 count 240/VL4231,  (On July 10th just restarted meds for 1 wk prior to labs), has missed 2 doses in 3 weeks on truvada/prezista/ritonavir. We last saw her 3 wks ago, she has been feeling poorly since we last saw . Originally had MSK pain of chest, did a trial of tramadol which did not work, then had vicodin RX called in last week. She now reports having neck and back started 6 days ago. Difficult to bend over. Having associated HA , predominantly occipital that migrates frontal when she bends over. No light sensitivities, no nausea or vomiting. She now is having 4 days of fever with Tmax 102 yesterday, temperature improved with  Ibuprofen. No sick contact.  Current Outpatient Prescriptions on File Prior to Visit  Medication Sig Dispense Refill  . Darunavir Ethanolate (PREZISTA) 800 MG tablet Take 800 mg by mouth daily.      [name] emtricitabine-tenofovir (TRUVADA) 200-300 MG per tablet Take 1 tablet by mouth daily.      . ferrous sulfate 325 (65 FE) MG tablet Take 325 mg by mouth 2 (two) times daily.      [name] HYDROcodone-acetaminophen (NORCO/VICODIN) 5-325 MG per tablet Take 1 tablet by mouth every 6 (six) hours as needed for pain.  20 tablet  0  . ibuprofen (ADVIL,MOTRIN) 800 MG tablet Take 800 mg by mouth every 8 (eight) hours as needed for pain.      . naproxen (NAPROSYN) 375 MG tablet Take 1 tablet (375 mg total) by mouth 2 (two) times daily.  20 tablet  0  . ritonavir (NORVIR) 100 MG capsule Take 100 mg by mouth daily.      . traMADol (ULTRAM) 50 MG tablet Take 1 tablet (50 mg total) by mouth every 6 (six) hours as needed for pain.  20 tablet  0   No current facility-administered medications on file prior to visit.   Active Ambulatory  Problems    Diagnosis Date Noted  . Chest pain 03/11/2012  . Dysphagia 03/11/2012  . 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 Anemia of chronic disease 03/11/2012  . Reflux esophagitis 03/11/2012  . 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 HIV (human immunodeficiency virus infection) 03/16/2012  . Arthralgia 05/20/2012  . 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 Vaginal Discharge 05/30/2012  . Laceration of ankle, right 11/18/2012  . Muscle strain of chest wall 05/30/2013   Resolved Ambulatory Problems    Diagnosis Date Noted  . No Resolved Ambulatory Problems   Past Medical History  Diagnosis Date  . Tuberculosis   . Pelvic pain       Review of Systems Pain on deep inspiration bilaterally and feels pain radiating to back. No     Objective:   Physical Exam BP 124/79  Pulse 103  Temp(Src) 99.7 F (37.6 C) (Oral)  Wt 136 lb (61.689 kg)  BMI 26.56 kg/m2 Physical Exam  Constitutional: oriented to person, place, and time.  appears well-developed and well-nourished. Ill appearing HENT: nuchal rigidity+, with some limited range of motion. No papilledema, TM clear. Pale conjunctiva Mouth/Throat: Oropharynx is clear and moist. No oropharyngeal exudate.  Cardiovascular: Normal rate, regular rhythm and normal heart sounds. Exam reveals no gallop  and no friction rub.  No murmur heard.  Pulmonary/Chest: Effort normal and breath sounds normal. No respiratory distress.  no wheezes. Winces on deep inspiration Back: no reproducible pain when palpating spine or flank Abdominal: Soft. Bowel sounds are normal. exhibits no distension. There is no tenderness.  Lymphadenopathy: + left submandibular cervical adenopathy.  Neurological: alert and oriented to person, place, and time.  Skin: Skin is warm and dry. No rash noted. No erythema.  Psychiatric:  a normal mood and affect. behavior is normal.       Assessment & Plan:  22yo F with HIV, CD 4 count of 240, VL 4,000 who has past  hx of mTB presents with fevers, HA, neck pain concerning for meningoencephalitis however also has element of pleuritic chest/back pain. Physical exam significant for nuchal rigidity.  Meningoencephalitis = recommend to get N[facility]CT, LP, CSF cell count, HSV pcr, enterovirus PCR panel, would also check CSF Cryptococcal antigen. 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 Can start with meningitis management with vanco, ctx 2gm IV BID, acyclovir  Fevers = would check cbc, serum crag, blood cultures.  pleuritic chest pain radiates to back  = unclear if this process is starting out from back, as a radiculopathy vs. Pleural process. Recommend to start with cxr +/- chest CT.  HIV = continue with truvada,darunavir, ritonavir  Will admit to the teaching service, spoke with IM admit resident, pt assigned to Dr. Charlesetta Shanks team. Appreciate help in management of patient

## 2013-07-08 ENCOUNTER — Inpatient Hospital Stay (HOSPITAL_COMMUNITY): Payer: Medicaid Other

## 2013-07-08 LAB — CSF CELL COUNT WITH DIFFERENTIAL
Lymphs, CSF: 96 % — ABNORMAL HIGH (ref 40–80)
Monocyte-Macrophage-Spinal Fluid: 4 % — ABNORMAL LOW (ref 15–45)
RBC Count, CSF: 2 /mm3 — ABNORMAL HIGH
Tube #: 3
WBC, CSF: 358 /mm3 (ref 0–5)

## 2013-07-08 LAB — CBC
HCT: 32.2 % — ABNORMAL LOW (ref 36.0–46.0)
Hemoglobin: 10.6 g/dL — ABNORMAL LOW (ref 12.0–15.0)
MCH: 28 pg (ref 26.0–34.0)
MCHC: 32.9 g/dL (ref 30.0–36.0)
MCV: 85 fL (ref 78.0–100.0)
Platelets: 133 10*3/uL — ABNORMAL LOW (ref 150–400)
RBC: 3.79 MIL/uL — ABNORMAL LOW (ref 3.87–5.11)
RDW: 17.2 % — ABNORMAL HIGH (ref 11.5–15.5)
WBC: 3.5 10*3/uL — ABNORMAL LOW (ref 4.0–10.5)

## 2013-07-08 LAB — GRAM STAIN

## 2013-07-08 LAB — PROTIME-INR
INR: 1.03 (ref 0.00–1.49)
Prothrombin Time: 13.3 seconds (ref 11.6–15.2)

## 2013-07-08 LAB — CRYPTOCOCCAL ANTIGEN: Crypto Ag: NEGATIVE

## 2013-07-08 LAB — BASIC METABOLIC PANEL
BUN: 8 mg/dL (ref 6–23)
CO2: 23 mEq/L (ref 19–32)
Calcium: 8.7 mg/dL (ref 8.4–10.5)
Chloride: 102 mEq/L (ref 96–112)
Creatinine, Ser: 0.73 mg/dL (ref 0.50–1.10)
GFR calc Af Amer: >90 mL/min (ref >90)
GFR calc non Af Amer: >90 mL/min (ref >90)
Glucose, Bld: 92 mg/dL (ref 70–99)
Potassium: 4 mEq/L (ref 3.5–5.1)
Sodium: 134 mEq/L — ABNORMAL LOW (ref 135–145)

## 2013-07-08 LAB — PROTEIN AND GLUCOSE, CSF
Glucose, CSF: 38 mg/dL — ABNORMAL LOW (ref 43–76)
Total  Protein, CSF: 147 mg/dL — ABNORMAL HIGH (ref 15–45)

## 2013-07-08 LAB — TROPONIN I: Troponin I: <0.3 ng/mL (ref <0.30)

## 2013-07-08 LAB — CRYPTOCOCCAL ANTIGEN, CSF: Crypto Ag: NEGATIVE

## 2013-07-08 MED ORDER — ENOXAPARIN SODIUM 40 MG/0.4ML ~~LOC~~ SOLN
40.0000 mg | Freq: Every day | SUBCUTANEOUS | Status: DC
  Administered 2013-07-09 – 2013-07-10 (×2): 40 mg via SUBCUTANEOUS
  Filled 2013-07-08 (×4): qty 0.4

## 2013-07-08 MED ORDER — LORAZEPAM 1 MG PO TABS
1.0000 mg | ORAL_TABLET | Freq: Once | ORAL | Status: AC | PRN
  Administered 2013-07-08: 1 mg via ORAL
  Filled 2013-07-08: qty 1

## 2013-07-08 NOTE — Progress Notes (Signed)
Subjective: No complaints overnight. Still feels pain in head, neck, chest. Chest pain worse with movement.   Attempted LP yesterday at bedside but aborted given patient's agitation. CXR remarkable for left upper lung nodule. Crytococcus Ag negative.  Objective: Vital signs in last 24 hours: Filed Vitals:   07/07/13 1555 07/08/13 0700  BP: 120/78   Pulse: 86   Temp: 99.3 F (37.4 C)   TempSrc: Oral   Resp: 19   Height:  4\' 11"  (1.499 m)  Weight:  61.689 kg (136 lb)  SpO2: 100%    Physical Exam General: alert, cooperative, sitting in chair HEENT: pupils equal round and reactive to light, vision grossly intact, oropharynx clear and non-erythematous  Neck: supple, no lymphadenopathy, JVD, or carotid bruits Lungs: clear to ascultation bilaterally, normal work of respiration, no wheezes, rales, ronchi Heart: regular rate and rhythm, no murmurs, gallops, or rubs Abdomen: soft, non-tender, non-distended, normal bowel sounds Extremities: 2+ DP/PT pulses bilaterally, no cyanosis, clubbing, or edema, pain with neck flexion that extends down to back Neurologic: alert & oriented X3, cranial nerves II-XII intact, strength grossly intact, sensation intact to light touch  Lab Results: BMET    Component Value Date/Time   NA 134* 07/08/2013 0425   K 4.0 07/08/2013 0425   CL 102 07/08/2013 0425   CO2 23 07/08/2013 0425   GLUCOSE 92 07/08/2013 0425   BUN 8 07/08/2013 0425   CREATININE 0.73 07/08/2013 0425   CREATININE 0.76 06/23/2013 1103   CALCIUM 8.7 07/08/2013 0425   GFRNONAA >90 07/08/2013 0425   GFRAA >90 07/08/2013 0425   CBC    Component Value Date/Time   WBC 3.5* 07/08/2013 0425   RBC 3.79* 07/08/2013 0425   HGB 10.6* 07/08/2013 0425   HCT 32.2* 07/08/2013 0425   PLT 133* 07/08/2013 0425   MCV 85.0 07/08/2013 0425   MCH 28.0 07/08/2013 0425   MCHC 32.9 07/08/2013 0425   RDW 17.2* 07/08/2013 0425   LYMPHSABS 1.4 06/23/2013 1103   MONOABS 0.6 06/23/2013 1103   EOSABS 0.4 06/23/2013 1103   BASOSABS 0.0 06/23/2013 1103    Lab Results  Component Value Date   CKTOTAL 67 04/09/2012   CKMB 1.0 04/09/2012   TROPONINI <0.30 07/08/2013    Micro Results: Recent Results (from the past 240 hour(s))  CULTURE, BLOOD (ROUTINE X 2)     Status: None   Collection Time    07/07/13  4:43 PM      Result Value Range Status   Specimen Description BLOOD LEFT ARM   Final   Special Requests BOTTLES DRAWN AEROBIC ONLY 5CC   Final   Culture  Setup Time 07/07/2013 22:48   Final   Culture     Final   Value:        BLOOD CULTURE RECEIVED NO GROWTH TO DATE CULTURE WILL BE HELD FOR 5 DAYS BEFORE ISSUING A FINAL NEGATIVE REPORT   Report Status PENDING   Incomplete  CULTURE, BLOOD (ROUTINE X 2)     Status: None   Collection Time    07/07/13  4:57 PM      Result Value Range Status   Specimen Description BLOOD RIGHT ARM   Final   Special Requests BOTTLES DRAWN AEROBIC ONLY 10CC   Final   Culture  Setup Time 07/07/2013 22:50   Final   Culture     Final   Value:        BLOOD CULTURE RECEIVED NO GROWTH TO DATE CULTURE WILL BE HELD FOR  5 DAYS BEFORE ISSUING A FINAL NEGATIVE REPORT   Report Status PENDING   Incomplete   Studies/Results: Dg Chest 2 View  07/07/2013   *RADIOLOGY REPORT*  Clinical Data: Chest pain  CHEST - 2 VIEW  Comparison: Chest radiograph 03/03/2012, x-rays 06/12/2013  Findings: Normal cardiac silhouette.  There is a 11 mm nodular density in the left upper lobe.  This is not seen on more remote comparison exams. Nodular density in the left upper lobe on comparison CT from 03/03/2012 abutted the mediastinum.  No pulmonary edema.  No pleural fluid.  IMPRESSION:  Left upper lobe nodule.   Recommend CT thorax with contrast for further evaluation.   Original Report Authenticated By: Genevive Bi, M.D.   Ct Head Wo Contrast  07/07/2013   *RADIOLOGY REPORT*  Clinical Data: Fever, neck soreness  CT HEAD WITHOUT CONTRAST  Technique:  Contiguous axial images were obtained from the base of the  skull through the vertex without contrast.  Comparison: None.  Findings: No acute intracranial hemorrhage.  No focal mass lesion. No CT evidence of acute infarction.   No midline shift or mass effect.  No hydrocephalus.  Basilar cisterns are patent.  Paranasal sinuses and mastoid air cells are clear.  Orbits are normal.  IMPRESSION: Normal CT exam of the brain.   Original Report Authenticated By: Genevive Bi, M.D.   Medications: I have reviewed the patient's current medications. Scheduled Meds: . Darunavir Ethanolate  800 mg Oral Q breakfast  . emtricitabine-tenofovir  1 tablet Oral Daily  . 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 No photophobia, changes with vision, or overall worsening makes bacterial etiology less likely.  -Continue droplet precautions  -Neuro checks q6h  -Blood cultures x2 pending  -Perform LP under fluoroscopy for CSF analysis and to check HSV, enterovirus, cryptococcal Ag   #Chest pain: Possibly fibrosis 2/2 post-TB vs. Infection. Less likely cardiac given presentation and negative troponins x3. -f/u CXR with CT non-contrast   #HIV: Continue her home meds.   #Disposition: Pending improvement of her health but possibly 3-4 days.  This is a Psychologist, occupational Note.  The care of the patient was discussed with Dr. Janalyn Harder and the assessment and plan formulated with their assistance.  Please see their attached note for official documentation of the daily encounter.   LOS: 1 day   Jacinta Shoe, Med Student 07/08/2013, 12:42 PM

## 2013-07-08 NOTE — Procedures (Signed)
LP L L4/5 Opening P 27 cm water 12 cc clear CSF No comp

## 2013-07-08 NOTE — H&P (Signed)
I saw and evaluated the patient. I reviewed the resident's note and confirmed the resident's findings.  I agree with the assessment and plan as documented in the resident's note.  Briefly, 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  There was concern she may have a viral meningitis and she was directly admitted from [facility] to the Internal Medicine Teaching Service for further evaluation and care.  Non-contrast head CT was unremarkable.  LP revealed a mildly elevated opening pressure of 27 cm.  She has a WBC count of 358 (96% lymphocytes, 4% monocytes), RBC count of 2, elevated protein at 147, and mild hypoglycorrhachia at 38 (unfortunately we do not have a serum glucose level within 2-3 hours of the LP).  Gram stain and CSF cryptococcal antigen were also negative.  AFB stain and culture, bacterial culture, and herpes simplex are pending.  I suspect this represents a viral meningitis given the elevated WBC count with lymphocyte predominance and elevated protein.  That being said, there are some unusual aspects to the fluid that raises concern for another atypical process such as TB as the WBC count is slightly higher than is usually seen with a viral meningitis and the mild hypoglycorrhachia is unusual for a viral meningitis.  We will therefore consult ID with the following question:  Should we consider a diagnosis other than viral meningitis, such as Tb meningitis, in this woman with a h/o recently treated Tb with a lymphocyte predominate CSF WBC count of 358 and mild hypoglycorrhachia of 38?  In the meantime, we will continue current supportive care.

## 2013-07-08 NOTE — Progress Notes (Signed)
I have seen the patient and reviewed the daily progress note by Heywood Iles MS4 and discussed the care of the patient with them.  See below for documentation of my findings, assessment, and plans.  Subjective: The patient was afebrile overnight.  This morning, she still notes some mild neck and back pain only with full flexion of neck, but otherwise appears to be able to fully move head and neck without pain.  No photophobia.  Objective: Vital signs in last 24 hours: Filed Vitals:   07/07/13 1555 07/08/13 0700 07/08/13 0939 07/08/13 1413  BP: 120/78  107/62 113/67  Pulse: 86  72 82  Temp: 99.3 F (37.4 C)  99.3 F (37.4 C) 99.1 F (37.3 C)  TempSrc: Oral  Oral Oral  Resp: 19  18 18   Height:  4\' 11"  (1.499 m)    Weight:  136 lb (61.689 kg)    SpO2: 100%  100% 100%   Weight change:   Intake/Output Summary (Last 24 hours) at 07/08/13 1450 Last data filed at 07/08/13 0600  Gross per 24 hour  Intake    240 ml  Output      0 ml  Net    240 ml  General: alert, cooperative, and in no apparent distress HEENT: pupils equal round and reactive to light, vision grossly intact, oropharynx clear and non-erythematous  Neck: supple, no lymphadenopathy, neck and back discomfort reported with approx 80 degrees of flexion Lungs: clear to ascultation bilaterally, normal work of respiration, no wheezes, rales, ronchi Heart: regular rate and rhythm, no murmurs, gallops, or rubs Abdomen: soft, non-tender, non-distended, normal bowel sounds Extremities: no cyanosis, clubbing, or edema Neurologic: alert & oriented X3, cranial nerves II-XII intact, strength grossly intact, sensation intact to light touch  Lab Results: Reviewed and documented in Electronic Record Micro Results: Reviewed and documented in Electronic Record Studies/Results: Reviewed and documented in Electronic Record Medications: I have reviewed the patient's current medications. Scheduled Meds: . Darunavir Ethanolate  800 mg  Oral Q breakfast  . emtricitabine-tenofovir  1 tablet Oral Daily  . ritonavir  100 mg Oral Q breakfast   Continuous Infusions:  PRN Meds:.oxyCODONE Assessment/Plan: The patient is a 22 yo woman, history of HIV (CD4 = 240), prior TB, presenting from ID clinic with fever, neck stiffness.  # Neck Pain - unclear etiology.  In the context of fever, and with a history of HIV, we'd like to rule out meningitis.  Pain may also just be msk, given relatively benign physical exam findings, lack of photophobia.  Patient has been afebrile so far since admission. -patient to go for IR lumbar puncture today -follow-up LP results: cell count with differential, glucose, protein, culture, crypto, enterovirus, HSV -holding antibiotics for now; low threshold to start ceftriaxone 2 g BID, acyclovir, Vancomycin -f/u blood cultures  # Lung Nodule - seen on CXR on admission.  May represent changes from prior TB infection vs new nodule (infectious vs inflammatory vs less likely malignant) -ordered CT chest w/o contrast to further characterize this lesion  # HIV - CD4 = 240 (06/23/13) -continue Prezista, Truvada, Norvir  Dispo: Disposition is deferred at this time, awaiting improvement of current medical problems.  Anticipated discharge in approximately 1-2 day(s).   The patient does have a current PCP Gardiner Barefoot, MD) and does not need an [facility] hospital follow-up appointment after discharge.  .Services Needed at time of discharge: Y = Yes, Blank = No PT:   OT:   RN:   Equipment:  Other:     LOS: 1 day   Linward Headland, MD 07/08/2013, 2:50 PM

## 2013-07-08 NOTE — Progress Notes (Signed)
UR COMPLETED  

## 2013-07-09 MED ORDER — POLYETHYLENE GLYCOL 3350 17 G PO PACK
17.0000 g | PACK | Freq: Every day | ORAL | Status: DC
  Administered 2013-07-10 – 2013-07-11 (×2): 17 g via ORAL
  Filled 2013-07-09 (×2): qty 1

## 2013-07-09 MED ORDER — SENNOSIDES-DOCUSATE SODIUM 8.6-50 MG PO TABS
1.0000 | ORAL_TABLET | Freq: Every day | ORAL | Status: DC
  Administered 2013-07-10 (×2): 1 via ORAL
  Filled 2013-07-09 (×3): qty 1

## 2013-07-09 NOTE — Progress Notes (Signed)
Patient ID: Kelsey Rodriguez, female   DOB: Nov 15, 1991, 22 y.o.   MRN: [id number]         [facility]    Date of Admission:  07/07/2013     Principal Problem:   Acute lymphocytic meningitis Active Problems:   HIV (human immunodeficiency virus infection)   Tuberculosis of mediastinal lymph nodes   Anemia of chronic disease   Chest pain   . Darunavir Ethanolate  800 mg Oral Q breakfast  . emtricitabine-tenofovir  1 tablet Oral Daily  . enoxaparin (LOVENOX) injection  40 mg Subcutaneous Daily  . ritonavir  100 mg Oral Q breakfast    Subjective: She still having headaches that she rates 9/10 along with some pleuritic, lower chest pain and mid back pain. She's not had a recorded fever since hospitalization but has subjective fevers. He has had some mild anorexia but no weight loss. He has not had any cough or shortness of breath. She completed the 6 months of directly observed therapy for mediastinal tuberculosis last October. She had a 2 month lapse in her antiretroviral therapy after her Medicaid ran out. She did qualify for ADAP and restarted Truvada, Prezista and Norvir year one month ago. She recalls missing only 2 doses during that occurred when she started to get sick one week ago and did not feel well enough to take any of her medications.  Review of Systems: Constitutional: positive for anorexia, chills and fevers, negative for sweats and weight loss Eyes: negative Ears, nose, mouth, throat, and face: negative Respiratory: negative Cardiovascular: negative Gastrointestinal: negative Genitourinary:negative Neurological: positive for headaches and neck stiffness, negative for coordination problems, gait problems, memory problems, seizures and speech problems  Past Medical History  Diagnosis Date  . Tuberculosis   . HIV (human immunodeficiency virus infection) 02/2012  . Pelvic pain     History  Substance Use Topics  . Smoking status: Never Smoker     . Smokeless tobacco: Never Used  . Alcohol Use: No     Comment: socially    No family history on file.  No Known Allergies  Objective: Temp:  [98 F (36.7 C)-100.1 F (37.8 C)] 98 F (36.7 C) (07/26 0830) Pulse Rate:  [76-96] 76 (07/26 0830) Resp:  [18] 18 (07/26 0830) BP: (101-118)/(68-75) 101/70 mmHg (07/26 0830) SpO2:  [98 %-100 %] 98 % (07/26 0830)  General: She appears slightly uncomfortable due to headache but is alert and in no distress Skin: No rash Neck: Some stiffness to flexion Lymph nodes: Nontender, mobile left axillary nodes Oral: No oropharyngeal lesions Lungs: Clear Cor: Regular S1 and S2 with no murmurs Abdomen: Soft and nontender with no palpable masses Neuro: Alert with normal speech and conversation. Cranial nerves are intact. She has normal strength in all extremities Joints and extremities: No acute abnormalities Mood and affect: Normal  Lab Results Lab Results  Component Value Date   WBC 3.5* 07/08/2013   HGB 10.6* 07/08/2013   HCT 32.2* 07/08/2013   MCV 85.0 07/08/2013   PLT 133* 07/08/2013    Lab Results  Component Value Date   CREATININE 0.73 07/08/2013   BUN 8 07/08/2013   NA 134* 07/08/2013   K 4.0 07/08/2013   CL 102 07/08/2013   CO2 23 07/08/2013    Lab Results  Component Value Date   ALT 9 07/07/2013   AST 17 07/07/2013   ALKPHOS 91 07/07/2013   BILITOT 0.2* 07/07/2013      HIV 1 RNA Quant (copies/mL)  Date Value  06/23/2013 4231*  03/11/2013 <20   12/16/2012 <20      CD4 T Cell Abs (cmm)  Date Value  06/23/2013 240*  03/11/2013 220*  12/16/2012 290*   Microbiology: Recent Results (from the past 240 hour(s))  CULTURE, BLOOD (ROUTINE X 2)     Status: None   Collection Time    07/07/13  4:43 PM      Result Value Range Status   Specimen Description BLOOD LEFT ARM   Final   Special Requests BOTTLES DRAWN AEROBIC ONLY 5CC   Final   Culture  Setup Time 07/07/2013 22:48   Final   Culture     Final   Value:        BLOOD CULTURE  RECEIVED NO GROWTH TO DATE CULTURE WILL BE HELD FOR 5 DAYS BEFORE ISSUING A FINAL NEGATIVE REPORT   Report Status PENDING   Incomplete  CULTURE, BLOOD (ROUTINE X 2)     Status: None   Collection Time    07/07/13  4:57 PM      Result Value Range Status   Specimen Description BLOOD RIGHT ARM   Final   Special Requests BOTTLES DRAWN AEROBIC ONLY 10CC   Final   Culture  Setup Time 07/07/2013 22:50   Final   Culture     Final   Value:        BLOOD CULTURE RECEIVED NO GROWTH TO DATE CULTURE WILL BE HELD FOR 5 DAYS BEFORE ISSUING A FINAL NEGATIVE REPORT   Report Status PENDING   Incomplete  CSF CULTURE     Status: None   Collection Time    07/08/13  2:55 PM      Result Value Range Status   Specimen Description CSF   Final   Special Requests 3.0ML FLUID   Final   Gram Stain     Final   Value: WBC PRESENT, PREDOMINANTLY MONONUCLEAR     NO ORGANISMS SEEN     CYTOSPIN Performed at [facility]   Culture PENDING   Incomplete   Report Status PENDING   Incomplete  GRAM STAIN     Status: None   Collection Time    07/08/13  2:55 PM      Result Value Range Status   Specimen Description CSF   Final   Special Requests 3.0ML FLUID   Final   Gram Stain     Final   Value: WBC PRESENT, PREDOMINANTLY MONONUCLEAR     NO ORGANISMS SEEN     CYTO SPIN SLIDE   Report Status 07/08/2013 FINAL   Final    Studies/Results: Dg Chest 2 View  07/07/2013   *RADIOLOGY REPORT*  Clinical Data: Chest pain  CHEST - 2 VIEW  Comparison: Chest radiograph 03/03/2012, x-rays 06/12/2013  Findings: Normal cardiac silhouette.  There is a 11 mm nodular density in the left upper lobe.  This is not seen on more remote comparison exams. Nodular density in the left upper lobe on comparison CT from 03/03/2012 abutted the mediastinum.  No pulmonary edema.  No pleural fluid.  IMPRESSION:  Left upper lobe nodule.   Recommend CT thorax with contrast for further evaluation.   Original Report Authenticated By: Genevive Bi, M.D.    Ct Head Wo Contrast  07/07/2013   *RADIOLOGY REPORT*  Clinical Data: Fever, neck soreness  CT HEAD WITHOUT CONTRAST  Technique:  Contiguous axial images were obtained from the base of the skull through the vertex without contrast.  Comparison: None.  Findings: No acute intracranial hemorrhage.  No focal mass lesion. No CT evidence of acute infarction.   No midline shift or mass effect.  No hydrocephalus.  Basilar cisterns are patent.  Paranasal sinuses and mastoid air cells are clear.  Orbits are normal.  IMPRESSION: Normal CT exam of the brain.   Original Report Authenticated By: Genevive Bi, M.D.   Ct Chest Wo Contrast  07/08/2013   *RADIOLOGY REPORT*  Clinical Data: Right upper lobe lung nodule.  CT CHEST WITHOUT CONTRAST  Technique:  Multidetector CT imaging of the chest was performed following the standard protocol without IV contrast.  Comparison: 03/11/2012.  Findings: The chest wall is unremarkable and stable.  No breast masses.  Cluster of borderline enlarged right axillary lymph nodes. Small left axillary lymph nodes.  The bony thorax is intact.  The left upper lobe pulmonary lesions noted on the recent chest x- ray there is a new finding when compared to 03/11/2012.  It is a small calcified pleural lesion and could be the sequela of previous TB infection.  No calcified pleural plaques are identified elsewhere.  The lungs are clear.  No pleural effusion.  The heart is normal in size.  Prominent thymic tissue noted in the anterior mediastinum.  There is a calcified prevascular lymph node but was not present on the prior study also.  The esophagus is grossly normal.  The aorta is normal in caliber.  The upper abdomen is grossly normal.  IMPRESSION:  1.  The left upper lobe pulmonary nodule on the chest x-ray 07/07/2013 correlates with a calcified pleural lesion.  There is also a new calcified left prevascular mediastinal node and these are likely the sequelae of the previous TB infection. 2.  No  acute pulmonary findings or worrisome pulmonary lesions. 3.  Borderline right axillary lymph nodes.   Original Report Authenticated By: Rudie Meyer, M.D.   Dg Fluoro Guide Lumbar Puncture  07/08/2013   *RADIOLOGY REPORT*  Clinical Data:  Meningitis  DIAGNOSTIC LUMBAR PUNCTURE UNDER FLUOROSCOPIC GUIDANCE  Fluoroscopy time:   dictate in minutes & seconds  Technique:  The procedure was begun by Dr. Massie Kluver.  Informed consent and time-out procedure was performed.  The needle was positioned by him at the L4-5 space.  Subsequently, I assisted by placing the needle into the thecal sac.  Lumbar puncture was performed at the left L4-5 level using a 20 gauge needle with return of clear CSF with an opening pressure of 27 cm water.   12 ml of CSF were obtained for laboratory studies. The patient tolerated the procedure well and there were no apparent complications.  IMPRESSION: Successful lumbar puncture for CSF and pressure measurements.   Original Report Authenticated By: Jolaine Click, M.D.    Assessment: She has acute lymphocytic meningitis. I agree with concern for the possibility of tuberculous meningitis given the relatively high white blood count and low glucose even know this would be quite rare given completion of 6 months of directly observed therapy last year. Since she is not severely ill and has normal mentation I favor holding off on empiric 4 drug TB therapy for now. I have requested PCR testing for Mycobacterium tuberculosis on the spinal fluid. She could have antiretroviral or arboviral meningitis. She does not need droplet precautions for acute lymphocytic meningitis as she would if we suspected acute bacterial meningitis. She does not need airborne precautions for possible tuberculosis and she has no evidence of pneumonia at this time.  Her viral load did  reactivate when she was off of antiretroviral therapy. Her most recent viral load was obtained only one week after restarting antiretroviral  therapy. With recent CD4 count of 240 this is unlikely to be an opportunistic infection related to HIV.   Plan: 1. Continue current antiretroviral therapy 2. I would not start empiric TB therapy at this time 3. Had Mycobacterium tuberculosis PCR testing to CSF analyses 4. Await CSF cultures and viral studies 5. No need for droplet her airborne isolation 6. I will followup tomorrow  Cliffton Asters, MD [facility] [phone] pager   [phone] cell 07/09/2013, 3:02 PM

## 2013-07-09 NOTE — Progress Notes (Signed)
Called MD on call for medication for constipation.

## 2013-07-09 NOTE — Progress Notes (Signed)
Subjective: The patient notes no acute complaints today, stating she feels "much better".  She still notes mild neck/back stiffness with full flexion.   Objective: Vital signs in last 24 hours: Filed Vitals:   07/08/13 1413 07/08/13 1651 07/09/13 0454 07/09/13 0830  BP: 113/67 109/75 118/68 101/70  Pulse: 82 83 96 76  Temp: 99.1 F (37.3 C) 99.7 F (37.6 C) 100.1 F (37.8 C) 98 F (36.7 C)  TempSrc: Oral Oral Oral Oral  Resp: 18 18 18 18   Height:      Weight:      SpO2: 100% 100% 98% 98%   Weight change:   Intake/Output Summary (Last 24 hours) at 07/09/13 1353 Last data filed at 07/09/13 0818  Gross per 24 hour  Intake    480 ml  Output      0 ml  Net    480 ml  General: alert, cooperative, and in no apparent distress HEENT: pupils equal round and reactive to light, vision grossly intact, oropharynx clear and non-erythematous  Neck: supple, no lymphadenopathy, neck and back discomfort reported with approx 80 degrees of flexion Lungs: clear to ascultation bilaterally, normal work of respiration, no wheezes, rales, ronchi Heart: regular rate and rhythm, no murmurs, gallops, or rubs Abdomen: soft, non-tender, non-distended, normal bowel sounds  Extremities: no cyanosis, clubbing, or edema Neurologic: alert & oriented X3, cranial nerves II-XII intact, strength grossly intact, sensation intact to light touch  Lab Results: Basic Metabolic Panel:  Recent Labs Lab 07/07/13 1703 07/08/13 0425  NA 136 134*  K 4.2 4.0  CL 101 102  CO2 26 23  GLUCOSE 84 92  BUN 10 8  CREATININE 0.70 0.73  CALCIUM 9.3 8.7   Liver Function Tests:  Recent Labs Lab 07/07/13 1703  AST 17  ALT 9  ALKPHOS 91  BILITOT 0.2*  PROT 8.2  ALBUMIN 3.7   CBC:  Recent Labs Lab 07/07/13 1703 07/08/13 0425  WBC 3.7* 3.5*  HGB 10.9* 10.6*  HCT 33.1* 32.2*  MCV 85.1 85.0  PLT 121* 133*    Micro Results: Recent Results (from the past 240 hour(s))  CULTURE, BLOOD (ROUTINE X 2)      Status: None   Collection Time    07/07/13  4:43 PM      Result Value Range Status   Specimen Description BLOOD LEFT ARM   Final   Special Requests BOTTLES DRAWN AEROBIC ONLY 5CC   Final   Culture  Setup Time 07/07/2013 22:48   Final   Culture     Final   Value:        BLOOD CULTURE RECEIVED NO GROWTH TO DATE CULTURE WILL BE HELD FOR 5 DAYS BEFORE ISSUING A FINAL NEGATIVE REPORT   Report Status PENDING   Incomplete  CULTURE, BLOOD (ROUTINE X 2)     Status: None   Collection Time    07/07/13  4:57 PM      Result Value Range Status   Specimen Description BLOOD RIGHT ARM   Final   Special Requests BOTTLES DRAWN AEROBIC ONLY 10CC   Final   Culture  Setup Time 07/07/2013 22:50   Final   Culture     Final   Value:        BLOOD CULTURE RECEIVED NO GROWTH TO DATE CULTURE WILL BE HELD FOR 5 DAYS BEFORE ISSUING A FINAL NEGATIVE REPORT   Report Status PENDING   Incomplete  CSF CULTURE     Status: None   Collection  Time    07/08/13  2:55 PM      Result Value Range Status   Specimen Description CSF   Final   Special Requests 3.0ML FLUID   Final   Gram Stain     Final   Value: WBC PRESENT, PREDOMINANTLY MONONUCLEAR     NO ORGANISMS SEEN     CYTOSPIN Performed at [facility]   Culture PENDING   Incomplete   Report Status PENDING   Incomplete  GRAM STAIN     Status: None   Collection Time    07/08/13  2:55 PM      Result Value Range Status   Specimen Description CSF   Final   Special Requests 3.0ML FLUID   Final   Gram Stain     Final   Value: WBC PRESENT, PREDOMINANTLY MONONUCLEAR     NO ORGANISMS SEEN     CYTO SPIN SLIDE   Report Status 07/08/2013 FINAL   Final   Studies/Results: Dg Chest 2 View  07/07/2013   *RADIOLOGY REPORT*  Clinical Data: Chest pain  CHEST - 2 VIEW  Comparison: Chest radiograph 03/03/2012, x-rays 06/12/2013  Findings: Normal cardiac silhouette.  There is a 11 mm nodular density in the left upper lobe.  This is not seen on more remote comparison exams.  Nodular density in the left upper lobe on comparison CT from 03/03/2012 abutted the mediastinum.  No pulmonary edema.  No pleural fluid.  IMPRESSION:  Left upper lobe nodule.   Recommend CT thorax with contrast for further evaluation.   Original Report Authenticated By: Genevive Bi, M.D.   Ct Head Wo Contrast  07/07/2013   *RADIOLOGY REPORT*  Clinical Data: Fever, neck soreness  CT HEAD WITHOUT CONTRAST  Technique:  Contiguous axial images were obtained from the base of the skull through the vertex without contrast.  Comparison: None.  Findings: No acute intracranial hemorrhage.  No focal mass lesion. No CT evidence of acute infarction.   No midline shift or mass effect.  No hydrocephalus.  Basilar cisterns are patent.  Paranasal sinuses and mastoid air cells are clear.  Orbits are normal.  IMPRESSION: Normal CT exam of the brain.   Original Report Authenticated By: Genevive Bi, M.D.   Ct Chest Wo Contrast  07/08/2013   *RADIOLOGY REPORT*  Clinical Data: Right upper lobe lung nodule.  CT CHEST WITHOUT CONTRAST  Technique:  Multidetector CT imaging of the chest was performed following the standard protocol without IV contrast.  Comparison: 03/11/2012.  Findings: The chest wall is unremarkable and stable.  No breast masses.  Cluster of borderline enlarged right axillary lymph nodes. Small left axillary lymph nodes.  The bony thorax is intact.  The left upper lobe pulmonary lesions noted on the recent chest x- ray there is a new finding when compared to 03/11/2012.  It is a small calcified pleural lesion and could be the sequela of previous TB infection.  No calcified pleural plaques are identified elsewhere.  The lungs are clear.  No pleural effusion.  The heart is normal in size.  Prominent thymic tissue noted in the anterior mediastinum.  There is a calcified prevascular lymph node but was not present on the prior study also.  The esophagus is grossly normal.  The aorta is normal in caliber.  The upper  abdomen is grossly normal.  IMPRESSION:  1.  The left upper lobe pulmonary nodule on the chest x-ray 07/07/2013 correlates with a calcified pleural lesion.  There is also a  new calcified left prevascular mediastinal node and these are likely the sequelae of the previous TB infection. 2.  No acute pulmonary findings or worrisome pulmonary lesions. 3.  Borderline right axillary lymph nodes.   Original Report Authenticated By: Rudie Meyer, M.D.   Dg Fluoro Guide Lumbar Puncture  07/08/2013   *RADIOLOGY REPORT*  Clinical Data:  Meningitis  DIAGNOSTIC LUMBAR PUNCTURE UNDER FLUOROSCOPIC GUIDANCE  Fluoroscopy time:   dictate in minutes & seconds  Technique:  The procedure was begun by Dr. Massie Kluver.  Informed consent and time-out procedure was performed.  The needle was positioned by him at the L4-5 space.  Subsequently, I assisted by placing the needle into the thecal sac.  Lumbar puncture was performed at the left L4-5 level using a 20 gauge needle with return of clear CSF with an opening pressure of 27 cm water.   12 ml of CSF were obtained for laboratory studies. The patient tolerated the procedure well and there were no apparent complications.  IMPRESSION: Successful lumbar puncture for CSF and pressure measurements.   Original Report Authenticated By: Jolaine Click, M.D.   Medications: I have reviewed the patient's current medications. Scheduled Meds: . Darunavir Ethanolate  800 mg Oral Q breakfast  . emtricitabine-tenofovir  1 tablet Oral Daily  . enoxaparin (LOVENOX) injection  40 mg Subcutaneous Daily  . ritonavir  100 mg Oral Q breakfast   Continuous Infusions:  PRN Meds:.oxyCODONE  Assessment/Plan: The patient is a 22 yo woman, history of HIV (CD4 = 240), prior TB, presenting from ID clinic with fever, neck stiffness.   # Neck Pain - unclear etiology. In the context of fever, and with a history of HIV, we'd like to rule out meningitis. Initial LP results show glucose = 38, protein = 147, WBC  = 358.  These results raise concern for viral vs ?TB meningitis -consulted ID today for further recs regarding diagnosis and treatment -follow-up LP results: culture, enterovirus, HSV, AFB -f/u blood cultures   # Lung Nodule - seen on CXR on admission. CT showed a calcified pleural lesion, likely sequelae of prior TB infection.  # HIV - CD4 = 240 (06/23/13)  -continue Prezista, Truvada, Norvir   Dispo: Disposition is deferred at this time, awaiting improvement of current medical problems. Anticipated discharge in approximately 2-3 day(s).   The patient does have a current PCP Gardiner Barefoot, MD) and does not need an [facility] hospital follow-up appointment after discharge.  .Services Needed at time of discharge: Y = Yes, Blank = No PT:   OT:   RN:   Equipment:   Other:     LOS: 2 days   Linward Headland, MD 07/09/2013, 1:53 PM

## 2013-07-10 DIAGNOSIS — R51 Headache: Secondary | ICD-10-CM

## 2013-07-10 LAB — HERPES SIMPLEX VIRUS(HSV) DNA BY PCR
HSV 1 DNA: NOT DETECTED
HSV 2 DNA: NOT DETECTED

## 2013-07-10 MED ORDER — ONDANSETRON HCL 8 MG PO TABS
8.0000 mg | ORAL_TABLET | Freq: Once | ORAL | Status: AC
  Administered 2013-07-10: 8 mg via ORAL
  Filled 2013-07-10: qty 1

## 2013-07-10 MED ORDER — OXYCODONE HCL 5 MG PO TABS
7.5000 mg | ORAL_TABLET | ORAL | Status: DC | PRN
  Administered 2013-07-10: 7.5 mg via ORAL
  Filled 2013-07-10 (×2): qty 1

## 2013-07-10 NOTE — Progress Notes (Signed)
Subjective: The patient notes a mild headache this morning, which she describes as a mild diffuse pain in her head, without neck pain or increased neck stiffness.  AFB smear negative, culture pending, TB PCR pending.  The patient notes she hopes she can go home soon.  Objective: Vital signs in last 24 hours: Filed Vitals:   07/09/13 1355 07/09/13 2204 07/10/13 0525 07/10/13 0846  BP: 110/64 124/77 110/65 100/65  Pulse: 80 96 86 86  Temp: 98.5 F (36.9 C) 97.8 F (36.6 C) 100 F (37.8 C) 98.8 F (37.1 C)  TempSrc: Oral Oral Oral Oral  Resp: 18 20 19 20   Height:  4\' 11"  (1.499 m)    Weight:  139 lb 15.9 oz (63.5 kg)    SpO2: 97% 98% 100% 98%   Weight change:   Intake/Output Summary (Last 24 hours) at 07/10/13 1056 Last data filed at 07/10/13 0810  Gross per 24 hour  Intake    360 ml  Output      2 ml  Net    358 ml  General: alert, cooperative, and in no apparent distress HEENT: pupils equal round and reactive to light, vision grossly intact, oropharynx clear and non-erythematous  Neck: supple, no lymphadenopathy, neck and back discomfort reported with full flexion Lungs: clear to ascultation bilaterally, normal work of respiration, no wheezes, rales, ronchi Heart: regular rate and rhythm, no murmurs, gallops, or rubs Abdomen: soft, non-tender, non-distended, normal bowel sounds  Extremities: no cyanosis, clubbing, or edema Neurologic: alert & oriented X3, cranial nerves II-XII intact, strength grossly intact, sensation intact to light touch  Lab Results: Basic Metabolic Panel:  Recent Labs Lab 07/07/13 1703 07/08/13 0425  NA 136 134*  K 4.2 4.0  CL 101 102  CO2 26 23  GLUCOSE 84 92  BUN 10 8  CREATININE 0.70 0.73  CALCIUM 9.3 8.7   Liver Function Tests:  Recent Labs Lab 07/07/13 1703  AST 17  ALT 9  ALKPHOS 91  BILITOT 0.2*  PROT 8.2  ALBUMIN 3.7   CBC:  Recent Labs Lab 07/07/13 1703 07/08/13 0425  WBC 3.7* 3.5*  HGB 10.9* 10.6*  HCT 33.1*  32.2*  MCV 85.1 85.0  PLT 121* 133*    Micro Results: Recent Results (from the past 240 hour(s))  CULTURE, BLOOD (ROUTINE X 2)     Status: None   Collection Time    07/07/13  4:43 PM      Result Value Range Status   Specimen Description BLOOD LEFT ARM   Final   Special Requests BOTTLES DRAWN AEROBIC ONLY 5CC   Final   Culture  Setup Time 07/07/2013 22:48   Final   Culture     Final   Value:        BLOOD CULTURE RECEIVED NO GROWTH TO DATE CULTURE WILL BE HELD FOR 5 DAYS BEFORE ISSUING A FINAL NEGATIVE REPORT   Report Status PENDING   Incomplete  CULTURE, BLOOD (ROUTINE X 2)     Status: None   Collection Time    07/07/13  4:57 PM      Result Value Range Status   Specimen Description BLOOD RIGHT ARM   Final   Special Requests BOTTLES DRAWN AEROBIC ONLY 10CC   Final   Culture  Setup Time 07/07/2013 22:50   Final   Culture     Final   Value:        BLOOD CULTURE RECEIVED NO GROWTH TO DATE CULTURE WILL BE HELD FOR  5 DAYS BEFORE ISSUING A FINAL NEGATIVE REPORT   Report Status PENDING   Incomplete  CSF CULTURE     Status: None   Collection Time    07/08/13  2:55 PM      Result Value Range Status   Specimen Description CSF   Final   Special Requests 3.0ML FLUID   Final   Gram Stain     Final   Value: WBC PRESENT, PREDOMINANTLY MONONUCLEAR     NO ORGANISMS SEEN     CYTOSPIN Performed at [facility]   Culture NO GROWTH 1 DAY   Final   Report Status PENDING   Incomplete  GRAM STAIN     Status: None   Collection Time    07/08/13  2:55 PM      Result Value Range Status   Specimen Description CSF   Final   Special Requests 3.0ML FLUID   Final   Gram Stain     Final   Value: WBC PRESENT, PREDOMINANTLY MONONUCLEAR     NO ORGANISMS SEEN     CYTO SPIN SLIDE   Report Status 07/08/2013 FINAL   Final  AFB CULTURE WITH SMEAR     Status: None   Collection Time    07/08/13  2:55 PM      Result Value Range Status   Specimen Description CSF   Final   Special Requests 3.0ML FLUID    Final   ACID FAST SMEAR NO ACID FAST BACILLI SEEN   Final   Culture     Final   Value: CULTURE WILL BE EXAMINED FOR 6 WEEKS BEFORE ISSUING A FINAL REPORT   Report Status PENDING   Incomplete   Studies/Results: Ct Chest Wo Contrast  07/08/2013   *RADIOLOGY REPORT*  Clinical Data: Right upper lobe lung nodule.  CT CHEST WITHOUT CONTRAST  Technique:  Multidetector CT imaging of the chest was performed following the standard protocol without IV contrast.  Comparison: 03/11/2012.  Findings: The chest wall is unremarkable and stable.  No breast masses.  Cluster of borderline enlarged right axillary lymph nodes. Small left axillary lymph nodes.  The bony thorax is intact.  The left upper lobe pulmonary lesions noted on the recent chest x- ray there is a new finding when compared to 03/11/2012.  It is a small calcified pleural lesion and could be the sequela of previous TB infection.  No calcified pleural plaques are identified elsewhere.  The lungs are clear.  No pleural effusion.  The heart is normal in size.  Prominent thymic tissue noted in the anterior mediastinum.  There is a calcified prevascular lymph node but was not present on the prior study also.  The esophagus is grossly normal.  The aorta is normal in caliber.  The upper abdomen is grossly normal.  IMPRESSION:  1.  The left upper lobe pulmonary nodule on the chest x-ray 07/07/2013 correlates with a calcified pleural lesion.  There is also a new calcified left prevascular mediastinal node and these are likely the sequelae of the previous TB infection. 2.  No acute pulmonary findings or worrisome pulmonary lesions. 3.  Borderline right axillary lymph nodes.   Original Report Authenticated By: Rudie Meyer, M.D.   Dg Fluoro Guide Lumbar Puncture  07/08/2013   *RADIOLOGY REPORT*  Clinical Data:  Meningitis  DIAGNOSTIC LUMBAR PUNCTURE UNDER FLUOROSCOPIC GUIDANCE  Fluoroscopy time:   dictate in minutes & seconds  Technique:  The procedure was begun by Dr.  Massie Kluver.  Informed  consent and time-out procedure was performed.  The needle was positioned by him at the L4-5 space.  Subsequently, I assisted by placing the needle into the thecal sac.  Lumbar puncture was performed at the left L4-5 level using a 20 gauge needle with return of clear CSF with an opening pressure of 27 cm water.   12 ml of CSF were obtained for laboratory studies. The patient tolerated the procedure well and there were no apparent complications.  IMPRESSION: Successful lumbar puncture for CSF and pressure measurements.   Original Report Authenticated By: Jolaine Click, M.D.   Medications: I have reviewed the patient's current medications. Scheduled Meds: . Darunavir Ethanolate  800 mg Oral Q breakfast  . emtricitabine-tenofovir  1 tablet Oral Daily  . enoxaparin (LOVENOX) injection  40 mg Subcutaneous Daily  . polyethylene glycol  17 g Oral Daily  . ritonavir  100 mg Oral Q breakfast  . 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   # Neck Pain - In the context of fever, and with a history of HIV, we'd like to rule out meningitis. Initial LP results show glucose = 38, protein = 147, WBC = 358.  These results raise concern for viral vs ?TB meningitis. -ID consulted, greatly appreciate recs -AFB smear negative -prelim CSF culture shows no organisms -awaiting final CSF culture, enterovirus, HSV, AFB culture, TB PCR -blood cultures NGTD  # Lung Nodule - seen on CXR on admission. CT showed a calcified pleural lesion, likely sequelae of prior TB infection.  # HIV - CD4 = 240 (06/23/13)  -continue Prezista, Truvada, Norvir   Dispo: Disposition is deferred at this time, awaiting improvement of current medical problems. Anticipated discharge in approximately 1-2 day(s).   The patient does have a current  PCP Gardiner Barefoot, MD) and does not need an [facility] hospital follow-up appointment after discharge.  .Services Needed at time of discharge: Y = Yes, Blank = No PT:   OT:   RN:   Equipment:   Other:     LOS: 3 days   Linward Headland, MD 07/10/2013, 10:56 AM

## 2013-07-10 NOTE — Progress Notes (Signed)
Patient ID: Kelsey Rodriguez, female   DOB: [birth year], 22 y.o.   MRN: [id number]         [facility]    Date of Admission:  07/07/2013     Principal Problem:   Acute lymphocytic meningitis Active Problems:   HIV (human immunodeficiency virus infection)   Tuberculosis of mediastinal lymph nodes   Anemia of chronic disease   Chest pain   . Darunavir Ethanolate  800 mg Oral Q breakfast  . emtricitabine-tenofovir  1 tablet Oral Daily  . enoxaparin (LOVENOX) injection  40 mg Subcutaneous Daily  . polyethylene glycol  17 g Oral Daily  . ritonavir  100 mg Oral Q breakfast  . senna-docusate  1 tablet Oral QHS    Subjective: He says she's feeling better but it seems to occur improvement is primarily related to her pain medication. Her headache was 10 out of 10 this morning but now 6/10 after pain medication.  Objective: Temp:  [97.8 F (36.6 C)-100 F (37.8 C)] 98.8 F (37.1 C) (07/27 0846) Pulse Rate:  [86-96] 86 (07/27 0846) Resp:  [19-20] 20 (07/27 0846) BP: (100-124)/(65-77) 100/65 mmHg (07/27 0846) SpO2:  [98 %-100 %] 98 % (07/27 0846) Weight:  [63.5 kg (139 lb 15.9 oz)] 63.5 kg (139 lb 15.9 oz) (07/26 2204)  General: She is alert and smiling Skin: No rash Neck: Supple Lungs: Clear Cor: Regular S1 and S2 no murmurs  Lab Results Lab Results  Component Value Date   WBC 3.5* 07/08/2013   HGB 10.6* 07/08/2013   HCT 32.2* 07/08/2013   MCV 85.0 07/08/2013   PLT 133* 07/08/2013    HIV 1 RNA Quant (copies/mL)  Date Value  06/23/2013 4231*  03/11/2013 <20   12/16/2012 <20      CD4 T Cell Abs (cmm)  Date Value  06/23/2013 240*  03/11/2013 220*  12/16/2012 290*     Microbiology: Recent Results (from the past 240 hour(s))  CULTURE, BLOOD (ROUTINE X 2)     Status: None   Collection Time    07/07/13  4:43 PM      Result Value Range Status   Specimen Description BLOOD LEFT ARM   Final   Special Requests BOTTLES DRAWN AEROBIC ONLY 5CC   Final   Culture   Setup Time 07/07/2013 22:48   Final   Culture     Final   Value:        BLOOD CULTURE RECEIVED NO GROWTH TO DATE CULTURE WILL BE HELD FOR 5 DAYS BEFORE ISSUING A FINAL NEGATIVE REPORT   Report Status PENDING   Incomplete  CULTURE, BLOOD (ROUTINE X 2)     Status: None   Collection Time    07/07/13  4:57 PM      Result Value Range Status   Specimen Description BLOOD RIGHT ARM   Final   Special Requests BOTTLES DRAWN AEROBIC ONLY 10CC   Final   Culture  Setup Time 07/07/2013 22:50   Final   Culture     Final   Value:        BLOOD CULTURE RECEIVED NO GROWTH TO DATE CULTURE WILL BE HELD FOR 5 DAYS BEFORE ISSUING A FINAL NEGATIVE REPORT   Report Status PENDING   Incomplete  CSF CULTURE     Status: None   Collection Time    07/08/13  2:55 PM      Result Value Range Status   Specimen Description CSF   Final   Special Requests  3.0ML FLUID   Final   Gram Stain     Final   Value: WBC PRESENT, PREDOMINANTLY MONONUCLEAR     NO ORGANISMS SEEN     CYTOSPIN Performed at [facility]   Culture NO GROWTH 1 DAY   Final   Report Status PENDING   Incomplete  GRAM STAIN     Status: None   Collection Time    07/08/13  2:55 PM      Result Value Range Status   Specimen Description CSF   Final   Special Requests 3.0ML FLUID   Final   Gram Stain     Final   Value: WBC PRESENT, PREDOMINANTLY MONONUCLEAR     NO ORGANISMS SEEN     CYTO SPIN SLIDE   Report Status 07/08/2013 FINAL   Final  AFB CULTURE WITH SMEAR     Status: None   Collection Time    07/08/13  2:55 PM      Result Value Range Status   Specimen Description CSF   Final   Special Requests 3.0ML FLUID   Final   ACID FAST SMEAR NO ACID FAST BACILLI SEEN   Final   Culture     Final   Value: CULTURE WILL BE EXAMINED FOR 6 WEEKS BEFORE ISSUING A FINAL REPORT   Report Status PENDING   Incomplete   Assessment: The cause for lymphocytic meningitis remains uncertain. Her AFB stain on spinal fluid was negative but this is not a very  sensitive indicator of infection. She has not had any recorded fever since admission. I favor continuing observation off of empiric TB medications. Her MTB PCR will probably not be resulted for at least several days and it will take weeks for her cultures to be final. If she is improving she can to home and followup with [place] in clinic.  Plan: 1. Continue antiretroviral therapy 2. Await final CSF studies 3. Pain control  Cliffton Asters, MD [facility] [phone] pager   [phone] cell 07/10/2013, 2:04 PM

## 2013-07-11 ENCOUNTER — Encounter (HOSPITAL_COMMUNITY): Payer: Self-pay | Admitting: *Deleted

## 2013-07-11 DIAGNOSIS — A872 Lymphocytic choriomeningitis: Secondary | ICD-10-CM

## 2013-07-11 MED ORDER — HYDROCODONE-ACETAMINOPHEN 5-325 MG PO TABS: 1.0000 | ORAL_TABLET | Freq: Four times a day (QID) | ORAL | Status: DC | PRN

## 2013-07-11 NOTE — Progress Notes (Signed)
Patient ID: Kelsey Rodriguez, female   DOB: 08-Sep-1991, 22 y.o.   MRN: [id number]         [facility] for Infectious Disease    Date of Admission:  07/07/2013     Principal Problem:   Acute lymphocytic meningitis Active Problems:   HIV (human immunodeficiency virus infection)   Tuberculosis of mediastinal lymph nodes   Anemia of chronic disease   Chest pain     Subjective: She is feeling much better today. She states that her headache has improved. She rates it at 4/10 now and says she has not had any pain medication this morning. She is still having some left anterior chest pain when she takes a deep breath. She denies any new cough or shortness of breath. She's not having any trouble tolerating her antiretroviral medications.  Objective: Temp:  [98.1 F (36.7 C)-99.4 F (37.4 C)] 99.4 F (37.4 C) (07/28 0524) Pulse Rate:  [78-98] 78 (07/28 0524) Resp:  [20] 20 (07/28 0524) BP: (101-134)/(68-77) 112/68 mmHg (07/28 0524) SpO2:  [98 %-100 %] 100 % (07/28 0524) Weight:  [63.501 kg (139 lb 15.9 oz)] 63.501 kg (139 lb 15.9 oz) (07/27 2131)  General: She is standing at the sink washing up. She is smiling and in good spirits Skin: No rash Lungs: Clear Cor: Regular S1 and S2 no murmurs. She has some mild chest wall tenderness above her left anterior chest scar but no other signs of inflammation  Lab Results Lab Results  Component Value Date   WBC 3.5* 07/08/2013   HGB 10.6* 07/08/2013   HCT 32.2* 07/08/2013   MCV 85.0 07/08/2013   PLT 133* 07/08/2013    Lab Results  Component Value Date   CREATININE 0.73 07/08/2013   BUN 8 07/08/2013   NA 134* 07/08/2013   K 4.0 07/08/2013   CL 102 07/08/2013   CO2 23 07/08/2013     Microbiology: Recent Results (from the past 240 hour(s))  CULTURE, BLOOD (ROUTINE X 2)     Status: None   Collection Time    07/07/13  4:43 PM      Result Value Range Status   Specimen Description BLOOD LEFT ARM   Final   Special Requests BOTTLES DRAWN AEROBIC  ONLY 5CC   Final   Culture  Setup Time 07/07/2013 22:48   Final   Culture     Final   Value:        BLOOD CULTURE RECEIVED NO GROWTH TO DATE CULTURE WILL BE HELD FOR 5 DAYS BEFORE ISSUING A FINAL NEGATIVE REPORT   Report Status PENDING   Incomplete  CULTURE, BLOOD (ROUTINE X 2)     Status: None   Collection Time    07/07/13  4:57 PM      Result Value Range Status   Specimen Description BLOOD RIGHT ARM   Final   Special Requests BOTTLES DRAWN AEROBIC ONLY 10CC   Final   Culture  Setup Time 07/07/2013 22:50   Final   Culture     Final   Value:        BLOOD CULTURE RECEIVED NO GROWTH TO DATE CULTURE WILL BE HELD FOR 5 DAYS BEFORE ISSUING A FINAL NEGATIVE REPORT   Report Status PENDING   Incomplete  CSF CULTURE     Status: None   Collection Time    07/08/13  2:55 PM      Result Value Range Status   Specimen Description CSF   Final   Special Requests 3.  FLUID   Final   Gram Stain     Final   Value: WBC PRESENT, PREDOMINANTLY MONONUCLEAR     NO ORGANISMS SEEN     CYTOSPIN Performed at [facility]   Culture NO GROWTH 3 DAYS   Final   Report Status PENDING   Incomplete  GRAM STAIN     Status: None   Collection Time    07/08/13  2:55 PM      Result Value Range Status   Specimen Description CSF   Final   Special Requests 3.0ML FLUID   Final   Gram Stain     Final   Value: WBC PRESENT, PREDOMINANTLY MONONUCLEAR     NO ORGANISMS SEEN     CYTO SPIN SLIDE   Report Status 07/08/2013 FINAL   Final  AFB CULTURE WITH SMEAR     Status: None   Collection Time    07/08/13  2:55 PM      Result Value Range Status   Specimen Description CSF   Final   Special Requests 3.0ML FLUID   Final   ACID FAST SMEAR NO ACID FAST BACILLI SEEN   Final   Culture     Final   Value: CULTURE WILL BE EXAMINED FOR 6 WEEKS BEFORE ISSUING A FINAL REPORT   Report Status PENDING   Incomplete   Assessment: She appears to be improving spontaneously. I have checked with the lab and they do have the spinal  fluid specimen and will be sent out to [facility] today for Mycobacterium tuberculosis PCR testing. I do not expect a result back for at least 4-5 days and of course cultures will take weeks. I do not have a high suspicion for tuberculosis meningitis given her improvement and would recommend discharge home with followup in our clinic.  Plan: 1. Discharge home with followup in ID clinic  Cliffton Asters, MD [facility] for Infectious Disease [facility] [phone] pager   [phone] cell 07/11/2013, 3:36 PM

## 2013-07-11 NOTE — Progress Notes (Signed)
   I have seen the patient and reviewed the daily progress note by Heywood Iles MS4 and discussed the care of the patient with them.  See below for documentation of my findings, assessment, and plans.  Subjective: No acute events overnight.  The patient remained afebrile.  She still notes some neck pain with full neck flexion.  Objective: Vital signs in last 24 hours: Filed Vitals:   07/10/13 1350 07/10/13 1841 07/10/13 2131 07/11/13 0524  BP: 97/55 101/75 134/77 112/68  Pulse: 64  98 78  Temp: 98.7 F (37.1 C) 98.1 F (36.7 C) 99.4 F (37.4 C) 99.4 F (37.4 C)  TempSrc: Oral Oral Oral Oral  Resp: 20 20 20 20   Height:   4\' 11"  (1.499 m)   Weight:   139 lb 15.9 oz (63.501 kg)   SpO2: 98% 98% 100% 100%   Weight change: 0 oz (0.001 kg)  Intake/Output Summary (Last 24 hours) at 07/11/13 1051 Last data filed at 07/11/13 0700  Gross per 24 hour  Intake   1200 ml  Output      1 ml  Net   1199 ml  General: alert, cooperative, and in no apparent distress HEENT: pupils equal round and reactive to light, vision grossly intact, oropharynx clear and non-erythematous  Neck: supple, no lymphadenopathy, neck and back discomfort reported with full flexion Lungs: clear to ascultation bilaterally, normal work of respiration, no wheezes, rales, ronchi Heart: regular rate and rhythm, no murmurs, gallops, or rubs Abdomen: soft, non-tender, non-distended, normal bowel sounds  Extremities: no cyanosis, clubbing, or edema Neurologic: alert & oriented X3, cranial nerves II-XII intact, strength grossly intact, sensation intact to light touch  Lab Results: Reviewed and documented in Electronic Record Micro Results: Reviewed and documented in Electronic Record Studies/Results: Reviewed and documented in Electronic Record Medications: I have reviewed the patient's current medications. Scheduled Meds: . Darunavir Ethanolate  800 mg Oral Q breakfast  . emtricitabine-tenofovir  1 tablet Oral Daily    . enoxaparin (LOVENOX) injection  40 mg Subcutaneous Daily  . polyethylene glycol  17 g Oral Daily  . ritonavir  100 mg Oral Q breakfast  . senna-docusate  1 tablet Oral QHS   Continuous Infusions:  PRN Meds:.oxyCODONE Assessment/Plan: The patient is a 22 yo woman, history of HIV (CD4 = 240), prior TB, presenting from ID clinic with fever, neck stiffness.   # Neck Pain - In the context of fever, and with a history of HIV, we'd like to rule out meningitis. Initial LP results show glucose = 38, protein = 147, WBC = 358. These results raise concern for viral vs ?TB meningitis.  -ID consulted, greatly appreciate recs  -AFB smear negative , HSV negative, crypto negative -prelim CSF culture shows no organisms  -awaiting final CSF culture, enterovirus, AFB culture, TB PCR  -blood cultures NGTD   # Lung Nodule - seen on CXR on admission. CT showed a calcified pleural lesion, likely sequelae of prior TB infection.   # HIV - CD4 = 240 (06/23/13)  -continue Prezista, Truvada, Norvir   Dispo: Discharge home today  The patient does have a current PCP Gardiner Barefoot, MD) and does not need an [facility] hospital follow-up appointment after discharge.   .Services Needed at time of discharge: Y = Yes, Blank = No PT:   OT:   RN:   Equipment:   Other:     LOS: 4 days   Linward Headland, MD 07/11/2013, 10:51 AM

## 2013-07-11 NOTE — Discharge Summary (Signed)
Name: Kelsey Rodriguez MRN: [id number] DOB: 1991/04/20 22 y.o. PCP: Gardiner Barefoot, MD  Date of Admission: 07/07/2013  3:05 PM Date of Discharge: 07/11/2013 Attending Physician: Rocco Serene, MD  Discharge Diagnosis: 1. Acute lymphocytic meningitis - CSF with lymphocyte predominance, awaiting final labs 2. Chest pain - possible caused by calcified pleural lesion seen on CT (sequelae from prior TB) 3. HIV - CD4 240 as of 06/2013 4. Anemia of chronic disease  Discharge Medications:   Medication List    STOP taking these medications       ibuprofen 800 MG tablet  Commonly known as:  ADVIL,MOTRIN      TAKE these medications       emtricitabine-tenofovir 200-300 MG per tablet  Commonly known as:  TRUVADA  Take 1 tablet by mouth daily.     ferrous sulfate 325 (65 FE) MG tablet  Take 325 mg by mouth 2 (two) times daily.     HYDROcodone-acetaminophen 5-325 MG per tablet  Commonly known as:  NORCO/VICODIN  Take 1 tablet by mouth every 6 (six) hours as needed for pain.     multivitamin with minerals Tabs  Take 1 tablet by mouth daily.     naproxen 375 MG tablet  Commonly known as:  NAPROSYN  Take 1 tablet (375 mg total) by mouth 2 (two) times daily.     PREZISTA 800 MG tablet  Generic drug:  Darunavir Ethanolate  Take 800 mg by mouth daily.     ritonavir 100 MG capsule  Commonly known as:  NORVIR  Take 100 mg by mouth daily.        Disposition and follow-up:   [name] was discharged from [facility] in Stable condition.  At the hospital follow up visit please address:  1.  Please follow-up pending labs  2.  Labs / imaging needed at time of follow-up: None  3.  Pending labs/ test needing follow-up: CSF labs: Final culture, enterovirus, AFB culture, TB PCR  Follow-up Appointments:     Follow-up Information   Follow up with [name], MD On 08/01/2013. (3:15 pm)    Contact information:   [address] [place]  [address] [phone]       Discharge Instructions: Discharge Orders   Future Appointments Provider Department Dept Phone   08/01/2013 3:15 PM [name], MD [facility] for Infectious Disease [phone]   09/21/2013 3:45 PM [name], MD [facility] for Infectious Disease [phone]   Future Orders Complete By Expires     Call MD for:  difficulty breathing, headache or visual disturbances  As directed     Call MD for:  severe uncontrolled pain  As directed     Call MD for:  temperature >100.4  As directed     Diet - low sodium heart healthy  As directed     Increase activity slowly  As directed        Consultations: Infectious Disease (Dr. Orvan Falconer)  Procedures Performed:  Dg Chest 2 View  07/07/2013   *RADIOLOGY REPORT*  Clinical Data: Chest pain  CHEST - 2 VIEW  Comparison: Chest radiograph 03/03/2012, x-rays 06/12/2013  Findings: Normal cardiac silhouette.  There is a 11 mm nodular density in the left upper lobe.  This is not seen on more remote comparison exams. Nodular density in the left upper lobe on comparison CT from 03/03/2012 abutted the mediastinum.  No pulmonary edema.  No pleural fluid.  IMPRESSION:  Left upper lobe nodule.   Recommend CT thorax with contrast for further evaluation.   Original Report Authenticated By: Genevive Bi, M.D.   Dg Chest 2 View  06/12/2013   *RADIOLOGY REPORT*  Clinical Data: Shortness of breath.  CHEST - 2 VIEW  Comparison: PA and lateral chest 05/30/2013.  Findings: Lungs are clear.  Heart size is normal.  No pneumothorax or pleural fluid.  IMPRESSION: No acute disease.   Original Report Authenticated By: Holley Dexter, M.D.   Ct Head Wo Contrast  07/07/2013   *RADIOLOGY REPORT*  Clinical Data: Fever, neck soreness  CT HEAD WITHOUT CONTRAST  Technique:  Contiguous axial images were obtained from the base of the skull through the vertex without contrast.  Comparison: None.  Findings: No acute  intracranial hemorrhage.  No focal mass lesion. No CT evidence of acute infarction.   No midline shift or mass effect.  No hydrocephalus.  Basilar cisterns are patent.  Paranasal sinuses and mastoid air cells are clear.  Orbits are normal.  IMPRESSION: Normal CT exam of the brain.   Original Report Authenticated By: Genevive Bi, M.D.   Ct Chest Wo Contrast  07/08/2013   *RADIOLOGY REPORT*  Clinical Data: Right upper lobe lung nodule.  CT CHEST WITHOUT CONTRAST  Technique:  Multidetector CT imaging of the chest was performed following the standard protocol without IV contrast.  Comparison: 03/11/2012.  Findings: The chest wall is unremarkable and stable.  No breast masses.  Cluster of borderline enlarged right axillary lymph nodes. Small left axillary lymph nodes.  The bony thorax is intact.  The left upper lobe pulmonary lesions noted on the recent chest x- ray there is a new finding when compared to 03/11/2012.  It is a small calcified pleural lesion and could be the sequela of previous TB infection.  No calcified pleural plaques are identified elsewhere.  The lungs are clear.  No pleural effusion.  The heart is normal in size.  Prominent thymic tissue noted in the anterior mediastinum.  There is a calcified prevascular lymph node but was not present on the prior study also.  The esophagus is grossly normal.  The aorta is normal in caliber.  The upper abdomen is grossly normal.  IMPRESSION:  1.  The left upper lobe pulmonary nodule on the chest x-ray 07/07/2013 correlates with a calcified pleural lesion.  There is also a new calcified left prevascular mediastinal node and these are likely the sequelae of the previous TB infection. 2.  No acute pulmonary findings or worrisome pulmonary lesions. 3.  Borderline right axillary lymph nodes.   Original Report Authenticated By: Rudie Meyer, M.D.   Dg Fluoro Guide Lumbar Puncture  07/08/2013   *RADIOLOGY REPORT*  Clinical Data:  Meningitis  DIAGNOSTIC LUMBAR  PUNCTURE UNDER FLUOROSCOPIC GUIDANCE  Fluoroscopy time:   dictate in minutes & seconds  Technique:  The procedure was begun by Dr. Massie Kluver.  Informed consent and time-out procedure was performed.  The needle was positioned by him at the L4-5 space.  Subsequently, I assisted by placing the needle into the thecal sac.  Lumbar puncture was performed at the left L4-5 level using a 20 gauge needle with return of clear CSF with an opening pressure of 27 cm water.   12 ml of CSF were obtained for laboratory studies. The patient tolerated the procedure well and there were no apparent complications.  IMPRESSION: Successful lumbar puncture for CSF and pressure measurements.   Original Report Authenticated By: Jolaine Click,  M.D.    Admission HPI:  The patient is a 22 YO female who was diagnosed with HIV and TB approximately 1 year ago. She was treated for TB which was parnchymal and mediastinal and started on HAART.Last CD4 count was 240 and viral load 4200. She has had off and on chest pain since that time. She notes 1 week of worsening chest pain in the middle of her chest which occasionally wraps around her chest. She states that it is worse with breathing or activity and nothing makes it better. She did try tramadol for the chest pain which did not work so she tried some vicodin which helps for several hours then wears off. She has also been having some fevers since Monday (up to 102) and new neck pain. The neck pain is worsened with bending over and she states yesterday she was unable to bend over. The neck pain also radiates into her head and her lower back. She denies nausea or vomiting. She denies diarrhea or constipation. She denies light or sound sensitivity. She has had some headaches caused by radiation of the neck pain up into her head. 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 She does not smoke, drink alcohol, or do any illicit  substances.  Hospital Course by problem list: #Acute lymphocytic meningitis - Directly admitted from ID clinic for concern of meningoencephalitis. Non-contrast head CT unremarkable for lesions predisposing to increased ICP. LP attempted at bedside unsuccessful due to patient agitation, so repeat LP was done under fluoroscopy guidance. CSF remarkable for glucose 38, protein 147, WBC 358. CSF negative for HSV, Cryptococcus.  CSF culture and AFB cultures were negative at the time of discharge, but final reads were pending.  Enterovirus and TB PCR were pending at the time of discharge.  ID was consulted.  Since the patient only had symptoms of mild headache, and no specific etiology for the patient's symptoms was found, the patient was only treated with pain medications.   #Chest pain - Troponins negative x3. CT chest this admission showed LUL calcified pleural lesion, likely representing changes from her prior TB infection. Treated with home pain medication and improved through hospitalization.   #HIV: The patient was continued on her ritonavir, prezista, and truvada throughout her hospitalization and at discharge.  Discharge Vitals:   BP 112/68  Pulse 78  Temp(Src) 99.4 F (37.4 C) (Oral)  Resp 20  Ht 4\' 11"  (1.499 m)  Wt 139 lb 15.9 oz (63.501 kg)  BMI 28.26 kg/m2  SpO2 100%  LMP 06/29/2013  Discharge Labs:  No results found for this or any previous visit (from the past 24 hour(s)).  Signed: Linward Headland, MD 07/11/2013, 10:11 AM   Time Spent on Discharge: 45 minutes Services Ordered on Discharge: None Equipment Ordered on Discharge: None

## 2013-07-11 NOTE — Progress Notes (Signed)
Internal Medicine Attending  Date: 07/11/2013  Patient name: Kelsey Rodriguez Medical record number: [id number] Date of birth: 05/24/1991 Age: 22 y.o. Gender: female  I saw and evaluated the patient. I reviewed the resident's note by Dr. Manson Passey and I agree with the resident's findings and plans as documented in his progress note.  [name] was smiling and without complaints this AM.  She states she feels much improved.  She still has mild pain with neck flexion but this is much improved.  I agree with discharge home with further follow-up in the [facility] as planned.

## 2013-07-11 NOTE — Progress Notes (Signed)
Subjective: Feels better this AM. No complaints overnight. Ready to go home today.   Objective: Vital signs in last 24 hours: Filed Vitals:   07/10/13 1350 07/10/13 1841 07/10/13 2131 07/11/13 0524  BP: 97/55 101/75 134/77 112/68  Pulse: 64  98 78  Temp: 98.7 F (37.1 C) 98.1 F (36.7 C) 99.4 F (37.4 C) 99.4 F (37.4 C)  TempSrc: Oral Oral Oral Oral  Resp: 20 20 20 20   Height:   4\' 11"  (1.499 m)   Weight:   63.501 kg (139 lb 15.9 oz)   SpO2: 98% 98% 100% 100%   Physical Exam General: alert, cooperative, sitting in chair HEENT: pupils equal round and reactive to light, vision grossly intact, oropharynx clear and non-erythematous  Neck: supple, no lymphadenopathy, JVD, or carotid bruits Lungs: clear to ascultation bilaterally, normal work of respiration, no wheezes, rales, ronchi Heart: regular rate and rhythm, no murmurs, gallops, or rubs Abdomen: soft, non-tender, non-distended, normal bowel sounds Extremities: 2+ DP/PT pulses bilaterally, no cyanosis, clubbing, or edema, pain with neck flexion that extends down to back Neurologic: alert & oriented X3, cranial nerves II-XII intact, strength grossly intact, sensation intact to light touch  Micro Results: Recent Results (from the past 240 hour(s))  CULTURE, BLOOD (ROUTINE X 2)     Status: None   Collection Time    07/07/13  4:43 PM      Result Value Range Status   Specimen Description BLOOD LEFT ARM   Final   Special Requests BOTTLES DRAWN AEROBIC ONLY 5CC   Final   Culture  Setup Time 07/07/2013 22:48   Final   Culture     Final   Value:        BLOOD CULTURE RECEIVED NO GROWTH TO DATE CULTURE WILL BE HELD FOR 5 DAYS BEFORE ISSUING A FINAL NEGATIVE REPORT   Report Status PENDING   Incomplete  CULTURE, BLOOD (ROUTINE X 2)     Status: None   Collection Time    07/07/13  4:57 PM      Result Value Range Status   Specimen Description BLOOD RIGHT ARM   Final   Special Requests BOTTLES DRAWN AEROBIC ONLY 10CC   Final   Culture  Setup Time 07/07/2013 22:50   Final   Culture     Final   Value:        BLOOD CULTURE RECEIVED NO GROWTH TO DATE CULTURE WILL BE HELD FOR 5 DAYS BEFORE ISSUING A FINAL NEGATIVE REPORT   Report Status PENDING   Incomplete  CSF CULTURE     Status: None   Collection Time    07/08/13  2:55 PM      Result Value Range Status   Specimen Description CSF   Final   Special Requests 3.0ML FLUID   Final   Gram Stain     Final   Value: WBC PRESENT, PREDOMINANTLY MONONUCLEAR     NO ORGANISMS SEEN     CYTOSPIN Performed at [facility]   Culture NO GROWTH 2 DAYS   Final   Report Status PENDING   Incomplete  GRAM STAIN     Status: None   Collection Time    07/08/13  2:55 PM      Result Value Range Status   Specimen Description CSF   Final   Special Requests 3.0ML FLUID   Final   Gram Stain     Final   Value: WBC PRESENT, PREDOMINANTLY MONONUCLEAR     NO ORGANISMS SEEN  CYTO SPIN SLIDE   Report Status 07/08/2013 FINAL   Final  AFB CULTURE WITH SMEAR     Status: None   Collection Time    07/08/13  2:55 PM      Result Value Range Status   Specimen Description CSF   Final   Special Requests 3.0ML FLUID   Final   ACID FAST SMEAR NO ACID FAST BACILLI SEEN   Final   Culture     Final   Value: CULTURE WILL BE EXAMINED FOR 6 WEEKS BEFORE ISSUING A FINAL REPORT   Report Status PENDING   Incomplete   Studies/Results: No results found. Medications: I have reviewed the patient's current medications. Scheduled Meds: . Darunavir Ethanolate  800 mg Oral Q breakfast  . emtricitabine-tenofovir  1 tablet Oral Daily  . enoxaparin (LOVENOX) injection  40 mg Subcutaneous Daily  . polyethylene glycol  17 g Oral Daily  . ritonavir  100 mg Oral Q breakfast  . 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  #Meningoencephalitis: Likely viral vs. fungal based on CSF analysis (glucose 38, protein 147, WBC 358). -CSF culture no growth to date x2  #Lung nodule: Seen on CXR; f/u CT showed calcified lesion possibly 2/2 post-TB sequelae   #HIV: Continue her home meds.   #Disposition: Stable for discharge today. Will need to f/u with Dr. Ilsa Iha in ID clinic.   This is a Psychologist, occupational Note.  The care of the patient was discussed with Dr. Janalyn Harder and the assessment and plan formulated with their assistance.  Please see their attached note for official documentation of the daily encounter.   LOS: 4 days  Beather Arbour, Med Student 07/11/2013, 8:02 AM

## 2013-07-12 ENCOUNTER — Encounter: Payer: Self-pay | Admitting: *Deleted

## 2013-07-12 ENCOUNTER — Telehealth: Payer: Self-pay | Admitting: Licensed Clinical Social Worker

## 2013-07-12 LAB — CSF CULTURE: Culture: NO GROWTH

## 2013-07-12 LAB — CSF CULTURE W GRAM STAIN

## 2013-07-12 LAB — PATHOLOGIST SMEAR REVIEW

## 2013-07-12 NOTE — Telephone Encounter (Signed)
Patient would like a note faxed for work due to her recent hospital admission. She has been out of work since 07/06/2013 and would like to return on 07/18/2013. She states she still has pain on left side of chest, and she had fever of 100.5 yesterday when she left the hospital. She denies any fever today it was 99.0. She will call back with the fax number to her job. She would like a return phone call.

## 2013-07-13 LAB — M. TUBERCULOSIS COMPLEX BY PCR: M. tuberculosis, Direct: NOT DETECTED

## 2013-07-13 LAB — CULTURE, BLOOD (ROUTINE X 2)
Culture: NO GROWTH
Culture: NO GROWTH

## 2013-07-14 LAB — ENTEROVIRUS PCR: Enterovirus PCR: NOT DETECTED

## 2013-07-21 ENCOUNTER — Ambulatory Visit (INDEPENDENT_AMBULATORY_CARE_PROVIDER_SITE_OTHER): Payer: Medicaid Other | Admitting: Internal Medicine

## 2013-07-21 ENCOUNTER — Encounter: Payer: Self-pay | Admitting: Internal Medicine

## 2013-07-21 ENCOUNTER — Ambulatory Visit: Payer: Medicaid Other | Admitting: Internal Medicine

## 2013-07-21 VITALS — BP 126/76 | HR 85 | Temp 98.2°F | Wt 133.0 lb

## 2013-07-21 DIAGNOSIS — B20 Human immunodeficiency virus [HIV] disease: Secondary | ICD-10-CM

## 2013-07-21 DIAGNOSIS — N920 Excessive and frequent menstruation with regular cycle: Secondary | ICD-10-CM

## 2013-07-21 MED ORDER — DRONABINOL 2.5 MG PO CAPS: 2.5000 mg | ORAL_CAPSULE | Freq: Two times a day (BID) | ORAL | Status: DC

## 2013-07-21 MED ORDER — FLUCONAZOLE 150 MG PO TABS: 150.0000 mg | ORAL_TABLET | Freq: Once | ORAL | Status: DC

## 2013-07-21 MED ORDER — DROSPIRENONE-ETHINYL ESTRADIOL 3-0.02 MG PO TABS: 1.0000 | ORAL_TABLET | Freq: Every day | ORAL | Status: DC

## 2013-07-21 NOTE — Progress Notes (Signed)
[facility] NOTE  RFV: routine Subjective:    Patient ID: Kelsey Rodriguez, female    DOB: May 12, 1991, 22 y.o.   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 She was recently discharged for viral meningitis. Since being hospitalized, she reports having decreased appetite since leaving hospital. Would like appetite stimulant.  She also reports heavy bleeding with menses, last 9 days and premenstrual cramping. She had been on OBC in the past which alleviated symptoms.   Current Outpatient Prescriptions on File Prior to Visit  Medication Sig Dispense Refill  . Darunavir Ethanolate (PREZISTA) 800 MG tablet Take 800 mg by mouth daily.      [name] emtricitabine-tenofovir (TRUVADA) 200-300 MG per tablet Take 1 tablet by mouth daily.      . ferrous sulfate 325 (65 FE) MG tablet Take 325 mg by mouth 2 (two) times daily.      [name] HYDROcodone-acetaminophen (NORCO/VICODIN) 5-325 MG per tablet Take 1 tablet by mouth every 6 (six) hours as needed for pain.  45 tablet  0  . Multiple Vitamin (MULTIVITAMIN WITH MINERALS) TABS Take 1 tablet by mouth daily.      . naproxen (NAPROSYN) 375 MG tablet Take 1 tablet (375 mg total) by mouth 2 (two) times daily.  20 tablet  0  . ritonavir (NORVIR) 100 MG capsule Take 100 mg by mouth daily.       No current facility-administered medications on file prior to visit.   Active Ambulatory Problems    Diagnosis Date Noted  . Dysphagia 03/11/2012  . 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 Anemia of chronic disease 03/11/2012  . Reflux esophagitis 03/11/2012  . 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 HIV (human immunodeficiency virus infection) 03/16/2012  . Arthralgia 05/20/2012  . 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 Vaginal Discharge 05/30/2012  . Laceration of ankle, right 11/18/2012  . Chest pain 07/07/2013  . Acute  lymphocytic meningitis 07/07/2013   Resolved Ambulatory Problems    Diagnosis Date Noted  . Chest pain 03/11/2012  . Muscle strain of chest wall 05/30/2013   Past Medical History  Diagnosis Date  . Tuberculosis   . Pelvic pain       Review of Systems 12 point review is negative except what is mentioned in hpi    Objective:   Physical Exam  BP 126/76  Pulse 85  Temp(Src) 98.2 F (36.8 C) (Oral)  Wt 133 lb (60.328 kg)  BMI 26.85 kg/m2  LMP 06/29/2013 Physical Exam  Constitutional: oriented to person, place, and time.  appears well-developed and well-nourished. No distress.  HENT:  Mouth/Throat: Oropharynx is clear and moist. No oropharyngeal exudate.  Cardiovascular: Normal rate, regular rhythm and normal heart sounds. Exam reveals no gallop and no friction rub.  No murmur heard.  Pulmonary/Chest: Effort normal and breath sounds normal. No respiratory distress.  no wheezes.  Abdominal: Soft. Bowel sounds are normal. He exhibits no distension. There is no tenderness.  Lymphadenopathy: no cervical adenopathy.  Neurological:  alert and oriented to person, place, and time.  Skin: Skin is warm and dry. No rash noted. No erythema.  Psychiatric:  a normal mood and affect. behavior is normal.         Assessment & Plan:  Menorrhagia = will start on ocp to see that helps  her symptoms  Viral meningitis= improved, resolved  Anorexia = will give low dose marinol to see if that helps her symptoms  hiv = will check labs to ensure that she has viral suppression  rtc in 2 months

## 2013-07-22 LAB — T-HELPER CELL (CD4) - (RCID CLINIC ONLY)
CD4 % Helper T Cell: 16 % — ABNORMAL LOW (ref 33–55)
CD4 T Cell Abs: 330 uL — ABNORMAL LOW (ref 400–2700)

## 2013-07-25 LAB — HIV-1 RNA QUANT-NO REFLEX-BLD
HIV 1 RNA Quant: 685 copies/mL — ABNORMAL HIGH (ref <20)
HIV-1 RNA Quant, Log: 2.84 {Log} — ABNORMAL HIGH (ref <1.30)

## 2013-08-01 ENCOUNTER — Ambulatory Visit: Payer: Medicaid Other | Admitting: Internal Medicine

## 2013-08-01 ENCOUNTER — Ambulatory Visit: Payer: Self-pay

## 2013-08-22 LAB — AFB CULTURE WITH SMEAR (NOT AT ARMC): Acid Fast Smear: NONE SEEN

## 2013-08-23 ENCOUNTER — Ambulatory Visit: Payer: Self-pay | Admitting: Internal Medicine

## 2013-08-25 ENCOUNTER — Emergency Department (HOSPITAL_COMMUNITY): Payer: Medicaid Other

## 2013-08-25 ENCOUNTER — Inpatient Hospital Stay (HOSPITAL_COMMUNITY)
Admission: EM | Admit: 2013-08-25 | Discharge: 2013-08-29 | DRG: 075 | Disposition: A | Discharge status: Home / Self Care | Payer: Medicaid Other | Attending: Internal Medicine | Admitting: Internal Medicine

## 2013-08-25 ENCOUNTER — Encounter (HOSPITAL_COMMUNITY): Payer: Self-pay | Admitting: Emergency Medicine

## 2013-08-25 DIAGNOSIS — Z8661 Personal history of infections of the central nervous system: Secondary | ICD-10-CM

## 2013-08-25 DIAGNOSIS — A872 Lymphocytic choriomeningitis: Principal | ICD-10-CM | POA: Diagnosis present

## 2013-08-25 DIAGNOSIS — G039 Meningitis, unspecified: Secondary | ICD-10-CM

## 2013-08-25 DIAGNOSIS — D509 Iron deficiency anemia, unspecified: Secondary | ICD-10-CM | POA: Diagnosis present

## 2013-08-25 DIAGNOSIS — D638 Anemia in other chronic diseases classified elsewhere: Secondary | ICD-10-CM | POA: Diagnosis present

## 2013-08-25 DIAGNOSIS — B20 Human immunodeficiency virus [HIV] disease: Secondary | ICD-10-CM | POA: Diagnosis present

## 2013-08-25 DIAGNOSIS — G510 Bell's palsy: Secondary | ICD-10-CM | POA: Diagnosis present

## 2013-08-25 DIAGNOSIS — A150 Tuberculosis of lung: Secondary | ICD-10-CM | POA: Diagnosis present

## 2013-08-25 DIAGNOSIS — G527 Disorders of multiple cranial nerves: Secondary | ICD-10-CM | POA: Diagnosis present

## 2013-08-25 LAB — CBC WITH DIFFERENTIAL/PLATELET
Basophils Absolute: 0 10*3/uL (ref 0.0–0.1)
Basophils Relative: 0 % (ref 0–1)
Eosinophils Absolute: 0.2 10*3/uL (ref 0.0–0.7)
Eosinophils Relative: 6 % — ABNORMAL HIGH (ref 0–5)
HCT: 30.4 % — ABNORMAL LOW (ref 36.0–46.0)
Hemoglobin: 10.3 g/dL — ABNORMAL LOW (ref 12.0–15.0)
Lymphocytes Relative: 48 % — ABNORMAL HIGH (ref 12–46)
Lymphs Abs: 1.6 10*3/uL (ref 0.7–4.0)
MCH: 29.1 pg (ref 26.0–34.0)
MCHC: 33.9 g/dL (ref 30.0–36.0)
MCV: 85.9 fL (ref 78.0–100.0)
Monocytes Absolute: 0.4 10*3/uL (ref 0.1–1.0)
Monocytes Relative: 14 % — ABNORMAL HIGH (ref 3–12)
Neutro Abs: 1 10*3/uL — ABNORMAL LOW (ref 1.7–7.7)
Neutrophils Relative %: 32 % — ABNORMAL LOW (ref 43–77)
Platelets: 207 10*3/uL (ref 150–400)
RBC: 3.54 MIL/uL — ABNORMAL LOW (ref 3.87–5.11)
RDW: 16 % — ABNORMAL HIGH (ref 11.5–15.5)
WBC: 3.2 10*3/uL — ABNORMAL LOW (ref 4.0–10.5)

## 2013-08-25 LAB — BASIC METABOLIC PANEL
BUN: 11 mg/dL (ref 6–23)
CO2: 25 mEq/L (ref 19–32)
Calcium: 9.6 mg/dL (ref 8.4–10.5)
Chloride: 104 mEq/L (ref 96–112)
Creatinine, Ser: 0.77 mg/dL (ref 0.50–1.10)
GFR calc Af Amer: >90 mL/min (ref >90)
GFR calc non Af Amer: >90 mL/min (ref >90)
Glucose, Bld: 81 mg/dL (ref 70–99)
Potassium: 3.8 mEq/L (ref 3.5–5.1)
Sodium: 140 mEq/L (ref 135–145)

## 2013-08-25 MED ORDER — ONDANSETRON HCL 4 MG/2ML IJ SOLN
4.0000 mg | Freq: Once | INTRAMUSCULAR | Status: AC
  Administered 2013-08-25: 4 mg via INTRAVENOUS
  Filled 2013-08-25: qty 2

## 2013-08-25 MED ORDER — MORPHINE SULFATE 4 MG/ML IJ SOLN
4.0000 mg | Freq: Once | INTRAMUSCULAR | Status: AC
  Administered 2013-08-25: 4 mg via INTRAVENOUS
  Filled 2013-08-25: qty 1

## 2013-08-25 NOTE — ED Notes (Signed)
Pt seems sleepy. She is not alert when I come in the room, but rouses when I call her name.

## 2013-08-25 NOTE — ED Notes (Signed)
Pollina, MD is aware of the pt's pain.

## 2013-08-25 NOTE — ED Provider Notes (Addendum)
CSN: [id number]     Arrival date & time 08/25/13  1718 History   First MD Initiated Contact with Patient 08/25/13 1805     Chief Complaint  Patient presents with  . Headache  . Facial Pain   (Consider location/radiation/quality/duration/timing/severity/associated sxs/prior Treatment) HPI Comments: Patient comes to the ER for evaluation of left facial numbness and weakness. Patient reports that she had headache and left-sided facial pain last night, that has improved but she is still experiencing some pain on the left side of her neck. Patient reports that she has noticed that she cannot close her left eye, is having trouble eating because her lips did not close all the way and she cannot taste food on the left side of her face. She has not had any fever.  Patient is a 22 y.o. female presenting with headaches.  Headache Associated symptoms: neck pain and numbness (left face)   Associated symptoms: no fever and no neck stiffness     Past Medical History  Diagnosis Date  . Tuberculosis   . HIV (human immunodeficiency virus infection) 02/2012  . Pelvic pain    Past Surgical History  Procedure Laterality Date  . Esophagogastroduodenoscopy  03/11/2012    Procedure: ESOPHAGOGASTRODUODENOSCOPY (EGD);  Surgeon: Hart Carwin, MD;  Location: [facility] ENDOSCOPY;  Service: Endoscopy;  Laterality: N/A;  . Appendectomy  ~ 2000  . Lung biopsy  02/2012  . Dilation and curettage of uterus  2008   History reviewed. No pertinent family history. History  Substance Use Topics  . Smoking status: Never Smoker   . Smokeless tobacco: Never Used  . Alcohol Use: No     Comment: socially   OB History   Grav Para Term Preterm Abortions TAB SAB Ect Mult Living   1 0 0 0 1 0 1 0 0 0      Review of Systems  Constitutional: Negative for fever.  HENT: Positive for neck pain. Negative for neck stiffness.   Neurological: Positive for weakness (Left face), numbness (left face) and headaches.  All other systems  reviewed and are negative.    Allergies  Review of patient's allergies indicates no known allergies.  Home Medications   Current Outpatient Rx  Name  Route  Sig  Dispense  Refill  . Darunavir Ethanolate (PREZISTA) 800 MG tablet   Oral   Take 800 mg by mouth daily.         [name] dronabinol (MARINOL) 2.5 MG capsule   Oral   Take 1 capsule (2.5 mg total) by mouth 2 (two) times daily before a meal.   60 capsule   2   . emtricitabine-tenofovir (TRUVADA) 200-300 MG per tablet   Oral   Take 1 tablet by mouth daily.         . ferrous sulfate 325 (65 FE) MG tablet   Oral   Take 325 mg by mouth 2 (two) times daily.         [name] HYDROcodone-acetaminophen (NORCO/VICODIN) 5-325 MG per tablet   Oral   Take 1 tablet by mouth every 6 (six) hours as needed for pain.   45 tablet   0   . Multiple Vitamin (MULTIVITAMIN WITH MINERALS) TABS   Oral   Take 1 tablet by mouth daily.         . naproxen (NAPROSYN) 375 MG tablet   Oral   Take 1 tablet (375 mg total) by mouth 2 (two) times daily.   20 tablet   0   .  ritonavir (NORVIR) 100 MG capsule   Oral   Take 100 mg by mouth daily.         . traMADol (ULTRAM) 50 MG tablet   Oral   Take 50 mg by mouth every 6 (six) hours as needed for pain.         . fluconazole (DIFLUCAN) 150 MG tablet   Oral   Take 1 tablet (150 mg total) by mouth once.   1 tablet   0    BP 124/71  Pulse 98  Temp(Src) 98.3 F (36.8 C) (Oral)  Resp 18  SpO2 100% Physical Exam  Constitutional: She is oriented to person, place, and time. She appears well-developed and well-nourished. No distress.  HENT:  Head: Normocephalic and atraumatic.  Right Ear: Hearing normal.  Left Ear: Hearing normal.  Nose: Nose normal.  Mouth/Throat: Oropharynx is clear and moist and mucous membranes are normal.  Eyes: Conjunctivae and EOM are normal. Pupils are equal, round, and reactive to light.  Neck: Normal range of motion. Neck supple. Muscular tenderness (Left  paraspinal) present. No Brudzinski's sign and no Kernig's sign noted.  Cardiovascular: Regular rhythm, S1 normal and S2 normal.  Exam reveals no gallop and no friction rub.   No murmur heard. Pulmonary/Chest: Effort normal and breath sounds normal. No respiratory distress. She exhibits no tenderness.  Abdominal: Soft. Normal appearance and bowel sounds are normal. There is no hepatosplenomegaly. There is no tenderness. There is no rebound, no guarding, no tenderness at McBurney's point and negative Murphy's sign. No hernia.  Musculoskeletal: Normal range of motion.  Neurological: She is alert and oriented to person, place, and time. She has normal strength. A cranial nerve deficit (Left facial nerve palsy - upper and lower motor neuron involvement) is present. No sensory deficit. Coordination normal. GCS eye subscore is 4. GCS verbal subscore is 5. GCS motor subscore is 6.  Skin: Skin is warm, dry and intact. No rash noted. No cyanosis.  Psychiatric: She has a normal mood and affect. Her speech is normal and behavior is normal. Thought content normal.    ED Course  Procedures (including critical care time)  PROCEDURE: Lumbar Puncture The patient was placed in the seated with help from the nursing staff. The area was cleansed and draped in usual sterile fashion. Anesthesia was achieved with 1% lidocaine. A 20-gauge 3.5-inch spinal needle was placed in the L3-L4 interspace. On the first attempt, clear cerebral spinal fluid was obtained. Four tubes were filled with 4 mL of CSF. These were sent for the usual tests, including 1 tube to be held for further analysis if needed. The patient had no immediate complications and tolerated the procedure well.    Labs Review Labs Reviewed  CBC WITH DIFFERENTIAL - Abnormal; Notable for the following:    WBC 3.2 (*)    RBC 3.54 (*)    Hemoglobin 10.3 (*)    HCT 30.4 (*)    RDW 16.0 (*)    Neutrophils Relative % 32 (*)    Neutro Abs 1.0 (*)    Lymphocytes  Relative 48 (*)    Monocytes Relative 14 (*)    Eosinophils Relative 6 (*)    All other components within normal limits  CSF CELL COUNT WITH DIFFERENTIAL - Abnormal; Notable for the following:    RBC Count, CSF 1 (*)    WBC, CSF 51 (*)    Lymphs, CSF 98 (*)    Monocyte-Macrophage-Spinal Fluid 2 (*)    All other components  within normal limits  PROTEIN, CSF - Abnormal; Notable for the following:    Total  Protein, CSF 64 (*)    All other components within normal limits  GRAM STAIN  CULTURE, BLOOD (ROUTINE X 2)  CULTURE, BLOOD (ROUTINE X 2)  CSF CULTURE  BASIC METABOLIC PANEL  GLUCOSE, CSF  HERPES SIMPLEX VIRUS(HSV) DNA BY PCR   Imaging Review Ct Head Wo Contrast  08/25/2013   *RADIOLOGY REPORT*  Clinical Data: Headache  CT HEAD WITHOUT CONTRAST  Technique:  Contiguous axial images were obtained from the base of the skull through the vertex without contrast.  Comparison: 07/07/2013  Findings: The brain has a normal appearance without evidence for hemorrhage, infarction, hydrocephalus, or mass lesion.  There is no extra axial fluid collection.  The skull and paranasal sinuses are normal.  IMPRESSION: Normal exam.   Original Report Authenticated By: Signa Kell, M.D.    MDM  Diagnosis: 1. Bell's palsy 2. Suspected viral meningitis  Patient presents to the ER with spelled palsy on the left side. Patient is HIV positive, however. Reviewing her records reveals that she was admitted in July with meningitis, ultimately found to be viral in nature. Because of this, I did consult Doctor Drue Second, infectious disease. She recommended further workup including lumbar puncture. She recommended IV acyclovir if CSF studies were abnormal.  Results of the Gram stain showed white blood cells without bacteria. CSF differential is 51 white cells, lymphocyte predominance. This is similar to the picture she had with her previous admission. Based on this, she will require repeat hospitalization with treatment  with IV acyclovir and will add empiric bacterial coverage as well until cultures are available. Consider CNS Lymphoma.    Gilda Crease, MD 08/26/13 6213  Gilda Crease, MD 08/26/13 [phone]

## 2013-08-25 NOTE — ED Notes (Signed)
Pt sts HA with facial pain and numbness last night that is now resolved; pt sts pain in neck at present

## 2013-08-26 ENCOUNTER — Inpatient Hospital Stay (HOSPITAL_COMMUNITY): Payer: Medicaid Other

## 2013-08-26 ENCOUNTER — Encounter (HOSPITAL_COMMUNITY): Payer: Self-pay | Admitting: Family Medicine

## 2013-08-26 DIAGNOSIS — Z21 Asymptomatic human immunodeficiency virus [HIV] infection status: Secondary | ICD-10-CM

## 2013-08-26 DIAGNOSIS — G510 Bell's palsy: Secondary | ICD-10-CM | POA: Diagnosis present

## 2013-08-26 DIAGNOSIS — A872 Lymphocytic choriomeningitis: Principal | ICD-10-CM

## 2013-08-26 DIAGNOSIS — D638 Anemia in other chronic diseases classified elsewhere: Secondary | ICD-10-CM

## 2013-08-26 DIAGNOSIS — A879 Viral meningitis, unspecified: Secondary | ICD-10-CM

## 2013-08-26 DIAGNOSIS — R209 Unspecified disturbances of skin sensation: Secondary | ICD-10-CM

## 2013-08-26 HISTORY — DX: Bell's palsy: G51.0

## 2013-08-26 LAB — PATHOLOGIST SMEAR REVIEW: Path Review: REACTIVE

## 2013-08-26 LAB — HERPES SIMPLEX VIRUS(HSV) DNA BY PCR
HSV 1 DNA: NOT DETECTED
HSV 2 DNA: NOT DETECTED

## 2013-08-26 LAB — GLUCOSE, CSF: Glucose, CSF: 50 mg/dL (ref 43–76)

## 2013-08-26 LAB — GRAM STAIN

## 2013-08-26 LAB — CSF CELL COUNT WITH DIFFERENTIAL
Lymphs, CSF: 98 % — ABNORMAL HIGH (ref 40–80)
Monocyte-Macrophage-Spinal Fluid: 2 % — ABNORMAL LOW (ref 15–45)
RBC Count, CSF: 1 /mm3 — ABNORMAL HIGH
Tube #: 3
WBC, CSF: 51 /mm3 (ref 0–5)

## 2013-08-26 LAB — PROTEIN, CSF: Total  Protein, CSF: 64 mg/dL — ABNORMAL HIGH (ref 15–45)

## 2013-08-26 MED ORDER — VANCOMYCIN HCL IN DEXTROSE 1-5 GM/200ML-% IV SOLN
1000.0000 mg | Freq: Once | INTRAVENOUS | Status: AC
  Administered 2013-08-26: 1000 mg via INTRAVENOUS
  Filled 2013-08-26: qty 200

## 2013-08-26 MED ORDER — DEXAMETHASONE SODIUM PHOSPHATE 10 MG/ML IJ SOLN
10.0000 mg | Freq: Once | INTRAMUSCULAR | Status: AC
  Administered 2013-08-26: 10 mg via INTRAVENOUS
  Filled 2013-08-26: qty 1

## 2013-08-26 MED ORDER — SODIUM CHLORIDE 0.9 % IV SOLN: 250.0000 mL | INTRAVENOUS | Status: DC | PRN

## 2013-08-26 MED ORDER — ENOXAPARIN SODIUM 40 MG/0.4ML ~~LOC~~ SOLN
40.0000 mg | SUBCUTANEOUS | Status: DC
  Administered 2013-08-26 – 2013-08-27 (×2): 40 mg via SUBCUTANEOUS
  Filled 2013-08-26 (×3): qty 0.4

## 2013-08-26 MED ORDER — SODIUM CHLORIDE 0.9 % IJ SOLN
3.0000 mL | Freq: Two times a day (BID) | INTRAMUSCULAR | Status: DC
  Administered 2013-08-26 – 2013-08-29 (×6): 3 mL via INTRAVENOUS

## 2013-08-26 MED ORDER — VANCOMYCIN HCL IN DEXTROSE 1-5 GM/200ML-% IV SOLN
1000.0000 mg | Freq: Three times a day (TID) | INTRAVENOUS | Status: DC
  Administered 2013-08-26 – 2013-08-27 (×3): 1000 mg via INTRAVENOUS
  Filled 2013-08-26 (×5): qty 200

## 2013-08-26 MED ORDER — SODIUM CHLORIDE 0.9 % IJ SOLN: 3.0000 mL | INTRAMUSCULAR | Status: DC | PRN

## 2013-08-26 MED ORDER — SODIUM CHLORIDE 0.9 % IJ SOLN: 3.0000 mL | Freq: Two times a day (BID) | INTRAMUSCULAR | Status: DC

## 2013-08-26 MED ORDER — DEXTROSE 5 % IV SOLN
2.0000 g | INTRAVENOUS | Status: DC
  Filled 2013-08-26 (×3): qty 2

## 2013-08-26 MED ORDER — INFLUENZA VAC SPLIT QUAD 0.5 ML IM SUSP
0.5000 mL | INTRAMUSCULAR | Status: AC
  Administered 2013-08-27: 0.5 mL via INTRAMUSCULAR
  Filled 2013-08-26: qty 0.5

## 2013-08-26 MED ORDER — DEXTROSE 5 % IV SOLN
600.0000 mg | Freq: Once | INTRAVENOUS | Status: DC
  Filled 2013-08-26 (×2): qty 12

## 2013-08-26 MED ORDER — GADOBENATE DIMEGLUMINE 529 MG/ML IV SOLN
15.0000 mL | Freq: Once | INTRAVENOUS | Status: AC
  Administered 2013-08-26: 15 mL via INTRAVENOUS

## 2013-08-26 MED ORDER — ENSURE COMPLETE PO LIQD
237.0000 mL | Freq: Two times a day (BID) | ORAL | Status: DC
  Administered 2013-08-27 – 2013-08-28 (×4): 237 mL via ORAL

## 2013-08-26 MED ORDER — MORPHINE SULFATE 2 MG/ML IJ SOLN: 2.0000 mg | INTRAMUSCULAR | Status: DC | PRN

## 2013-08-26 MED ORDER — DEXTROSE 5 % IV SOLN
600.0000 mg | Freq: Three times a day (TID) | INTRAVENOUS | Status: DC
  Administered 2013-08-26 – 2013-08-28 (×7): 600 mg via INTRAVENOUS
  Filled 2013-08-26 (×9): qty 12

## 2013-08-26 MED ORDER — SODIUM CHLORIDE 0.9 % IV SOLN
2.0000 g | INTRAVENOUS | Status: DC
  Administered 2013-08-26 – 2013-08-27 (×8): 2 g via INTRAVENOUS
  Filled 2013-08-26 (×14): qty 2000

## 2013-08-26 MED ORDER — IBUPROFEN 600 MG PO TABS
600.0000 mg | ORAL_TABLET | Freq: Four times a day (QID) | ORAL | Status: DC | PRN
  Filled 2013-08-26: qty 1

## 2013-08-26 MED ORDER — EMTRICITABINE-TENOFOVIR DF 200-300 MG PO TABS
1.0000 | ORAL_TABLET | Freq: Every day | ORAL | Status: DC
  Administered 2013-08-26 – 2013-08-29 (×4): 1 via ORAL
  Filled 2013-08-26 (×4): qty 1

## 2013-08-26 MED ORDER — RITONAVIR 100 MG PO CAPS
100.0000 mg | ORAL_CAPSULE | Freq: Every day | ORAL | Status: DC
  Administered 2013-08-26 – 2013-08-29 (×4): 100 mg via ORAL
  Filled 2013-08-26 (×5): qty 1

## 2013-08-26 MED ORDER — CEFOTAXIME SODIUM 1 G IJ SOLR
2.0000 g | Freq: Once | INTRAMUSCULAR | Status: AC
  Administered 2013-08-26: 2 g via INTRAVENOUS
  Filled 2013-08-26 (×2): qty 2

## 2013-08-26 MED ORDER — DEXTROSE 5 % IV SOLN
2.0000 g | Freq: Three times a day (TID) | INTRAVENOUS | Status: DC
  Administered 2013-08-26 – 2013-08-27 (×4): 2 g via INTRAVENOUS
  Filled 2013-08-26 (×6): qty 2

## 2013-08-26 MED ORDER — DARUNAVIR ETHANOLATE 800 MG PO TABS
800.0000 mg | ORAL_TABLET | Freq: Every day | ORAL | Status: DC
  Administered 2013-08-26 – 2013-08-29 (×4): 800 mg via ORAL
  Filled 2013-08-26 (×5): qty 1

## 2013-08-26 NOTE — Consult Note (Signed)
Chief Complaint: Left sided facial weakness, neck pain and occipital headache  HPI: Kelsey Rodriguez is a 22 y.o. female originally from [place] who moved here in 2011 who has a pmhx of HIV and TB and a recent hospital admission for aseptic meningitis for which she spent 4 days in the hospital and was discharged on 7/24.  Her TB and HIV were diagnosed in 2013.  Her last CD4 count was 330 on 8/7 and she is currently on HAART therapy. She was treated for TB for 9 months and had last chest X'Ray during her last admission.  Prior to her first admission in July she started experiencing neck pain, fevers, severe occipital headache which radiated down her left shoulder and would be ocaisonally accompanied by left arm weakness and once dizziness when walking.  She was admitted to hospital and discharged 4 days later, her CSF during that admission showed CSF showed prot 147, gluc 38, WBC 358 with negative HSV, crypto, TB, and enterovirus.  Since being discharged the patient has had constant headache and neck pain since  that has not increased or changed in the last week and fevers which have reached up to 102 F.  She came into the hospital via the ED on 9/11 after contacting Dr. Drue Second because she began experiencing left sided facial weakness and numbness. She also reports not tasting normally on the left side of her tongue and that she is having trouble chewing due to pain in her muscles.  She had an episode of nausea without vomiting. Her bowel moments have slowed over the past few weeks and she is experiencing some constipation, Her urination is normal.   She admits to intermittent chills but no night sweats. She admits to one episode of dizziness while walking.  The patients headache is at baseline from the last month. It starts on the back of the head on the left side and radiates to the front of the head.  The pain is associated with a tight feeling on her neck that radiates to her left shoulder. No sore throat, cough or  SOB.  She has reported 10 pounds of weight loss since her last admission which she attributes to a decrease in appetite.   She had a head CT w/o contrast on 08/25/2013 interpreted as normal. She has no sick contacts at home and has no recent travel. CSF on admission showed prot 64, gluc 50, RBC 1, WBC 51--98% lymphs, gram stain neg.  Past Medical History   Diagnosis  Date   .  Tuberculosis    .  HIV (human immunodeficiency virus infection)  02/2012   .  Pelvic pain     Past Surgical History   Procedure  Laterality  Date   .  Esophagogastroduodenoscopy   03/11/2012     Procedure: ESOPHAGOGASTRODUODENOSCOPY (EGD); Surgeon: Hart Carwin, MD; Location: [facility] ENDOSCOPY; Service: Endoscopy; Laterality: N/A;   .  Appendectomy   ~ 2000   .  Lung biopsy   02/2012   .  Dilation and curettage of uterus   2008   History reviewed. No pertinent family history.  History   Substance Use Topics   .  Smoking status:  Never Smoker   .  Smokeless tobacco:  Never Used   .  Alcohol Use:  No      Comment: socially    Reports NKDA   Subjective:  Is currently without complaints but is concerned about the possibility of having meningitis.  She is afebrile and  her headache has improved a bit but is still present---localized to the occipital region with radiation down her left trapezius. Continues to have left facial weakness and decreased sensation.  Objective:  Vital signs in last 24 hours:  Filed Vitals:    08/25/13 2120  08/26/13 0251  08/26/13 0321  08/26/13 0357   BP:  112/75  114/83  109/77  112/73   Pulse:  61  63  59  62   Temp:  98.1 F (36.7 C)    97.7 F (36.5 C)   TempSrc:  Oral    Oral   Resp:    19  16   Height:     5\' 5"  (1.651 m)   Weight:     59.104 kg (130 lb 4.8 oz)   SpO2:  100%  100%  100%  100%    Weight change:   Intake/Output Summary (Last 24 hours) at 08/26/13 1044 Last data filed at 08/26/13 0948   Gross per 24 hour   Intake  892 ml   Output  1 ml   Net  891 ml      Physical Examination  Vitals reviewed.  General: Alert and oriented, sitting up in bed HEENT: normocepahlic, EOM intact, pupils equal, round, and reactive to light, oropharynx clear  Neck: supple, no lymphadenopathy, full range of motion but some pain on left side  CV: RRR, no murmurs, rubs, or gallops  Lungs: bilateral lung sounds present, no wheezing, rhonchi, rales, crackles  Abd: soft, non-tender, non-distended, normoactive bowel sounds  MSK: Tender to palpation over the left trapezius and shoulder. Normal 5/5 strength in both arms and legs. 4/5 strength in left shoulder with shrug.  Kernig and Brudzinski negative.   Neuro: PERRLA, EOMI, Deficit found in CNV and CNVII:  Decreased sensation to light touch through V1-V3 distribution on left face, asymmetric smile (droop on left), 4/5 strength of left orbicularis oculi, decreased eyebrow raise on left, 4/5 strength left buccinator muscle.  Hearing intact bilaterally, tongue midline.  Skin: warm, no rashes noted    Poor IV access- consider PICC   Lab Results:  Micro Results:  Recent Results (from the past 240 hour(s))   CSF CULTURE Status: None    Collection Time    08/25/13 10:42 PM   Result  Value  Range  Status    Specimen Description  CSF   Final    Special Requests  NO 2 1CC   Final    Gram Stain    Final    Value:  CYTOSPIN SLIDE WBC PRESENT, PREDOMINANTLY MONONUCLEAR     NO ORGANISMS SEEN     Performed at [facility]    Culture  PENDING   Incomplete    Report Status  PENDING   Incomplete   GRAM STAIN Status: None    Collection Time    08/25/13 10:42 PM   Result  Value  Range  Status    Specimen Description  CSF   Final    Special Requests  NO 2 1CC   Final    Gram Stain    Final    Value:  CYTOSPUN     WBC PRESENT, PREDOMINANTLY MONONUCLEAR     NO ORGANISMS SEEN    Report Status  08/26/2013 FINAL   Final     CHEM PROFILE    Sodium 140        Potassium 3.8        Chloride 104  CO2 25         BUN 11        Creatinine, Ser 0.77        Calcium 9.6        GFR calc non Af Amer 90 mL/min">90        GFR calc Af Amer 90 mL/min (NOTE) The eGFR has been calculated using the CKD EPI equation. This calculation has not been validated in all clinical situations. eGFR's persistently <90 mL/min signify possible Chronic Kidney Disease.">9090 mL/min (NOTE) The eGFR has been calculated using the CKD EPI equation. This calculation has not been validated in all clinical situations. eGFR's persistently <90 mL/min signify possible Chronic Kidney Disease." border=0 src="[email or web address]:/PROGRAM%20FILES%20([email or web address])/EPIC/[email or web address]/EN-US/[email or web address]" width=5 height=10        Glucose, Bld 81         CBC    WBC 3.2        RBC 3.54        Hemoglobin 10.3        HCT 30.4        MCV 85.9        MCH 29.1        MCHC 33.9        RDW 16.0        Platelets 207         DIFFERENTIAL    Neutrophils Relative % 32        Lymphocytes Relative 48        Monocytes Relative 14        Eosinophils Relative 6        Basophils Relative 0        Neutro Abs 1.0        Lymphs Abs 1.6        Monocytes Absolute 0.4        Eosinophils Absolute 0.2        Basophils Absolute 0.0              Studies/Results:  Ct Head Wo Contrast  08/25/2013 *RADIOLOGY REPORT* Clinical Data: Headache CT HEAD WITHOUT CONTRAST Technique: Contiguous axial images were obtained from the base of the skull through the vertex without contrast. Comparison: 07/07/2013 Findings: The brain has a normal appearance without evidence for hemorrhage, infarction, hydrocephalus, or mass lesion. There is no extra axial fluid collection. The skull and paranasal sinuses are normal. IMPRESSION: Normal exam. Original Report Authenticated By: Signa Kell, M.D.   Medications: I have reviewed the patient's current medications.  Scheduled Meds:  .  acyclovir  600 mg  Intravenous  Once   .  acyclovir  600 mg  Intravenous  Q8H   .  ampicillin (OMNIPEN) IV  2  g  Intravenous  Q4H   .  ceFEPime (MAXIPIME) IV  2 g  Intravenous  Q8H   .  Darunavir Ethanolate  800 mg  Oral  Q breakfast   .  emtricitabine-tenofovir  1 tablet  Oral  Daily   .  enoxaparin (LOVENOX) injection  40 mg  Subcutaneous  Q24H   .  [START ON 08/27/2013] influenza vac split quadrivalent PF  0.5 mL  Intramuscular  Tomorrow-1000   .  ritonavir  100 mg  Oral  Q breakfast   .  sodium chloride  3 mL  Intravenous  Q12H   .  vancomycin  1,000 mg  Intravenous  Q8H    Continuous Infusions:  PRN Meds:.sodium chloride, morphine injection, sodium chloride  Assessment Acute lymphocytic meningitis c/b focal neurologic findings, tuberculosis meningitis  or fungal eitiology possible   Space Occupying Lesion  Headache caused by meningeal irritation- autoimmune? HIV Plan: Considering patients immunocompromised status continue treating as if bacterial/viral meningitis continue acyclovir, ampicillin, cefepime, and vancomycin. Continue Pain medication for headache - IV morphine prn - Ibuprofen 600mg  prn  MRI imaging of brain to evaluate for anatomic/ischemic causes of her focal neurologic deficits  Continue HAART for HIV Consider PICC line because of patients poor access  Total days of antibiotics: 2 ampicillin, cefepime, vancomycin.  Acyclovir 2  Pt seen, examined with MS4. Not reviewed. Please see my note as well.

## 2013-08-26 NOTE — H&P (Signed)
INTERNAL MEDICINE TEACHING SERVICE Attending Admission Note  Date: 08/26/2013  Patient name: Kelsey Rodriguez  Medical record number: [id number]  Date of birth: Aug 02, 1991    I have seen and evaluated [name] and discussed their care with the Residency Team.  Records reviewed. Patient examined. She has evidence of a CN VII, CN V, and CN XI deficit. Her findings are notable on the left side of face.  Her LP has a predominance of lymphocytes. She is afebrile.  I don't suspect bacterial meningitis. Given the focal nature of her symptoms, order an MRI of brain w/ w/o contrast to rule out mass, lymphoma, etc.  Consult ID for recs. For now, continue current management. Follow culture data for any new findings.    Jonah Blue, DO, FACP Faculty [facility] Internal Medicine Residency Program 08/26/2013, 12:22 PM

## 2013-08-26 NOTE — Progress Notes (Signed)
INITIAL NUTRITION ASSESSMENT  DOCUMENTATION CODES Per approved criteria  -Not Applicable   INTERVENTION: 1.  Supplements; Ensure Complete po TID, each supplement provides 350 kcal and 13 grams of protein. 2.  General healthful diet; encouraged intake as able.  Review importance of nutrition.   NUTRITION DIAGNOSIS: Unintended wt change related to illness, poor appetite as evidenced by pt report.   Monitor:  1.  Food/Beverage; pt meeting >/=90% estimated needs with tolerance. 2.  Wt/wt change; monitor trends  Reason for Assessment: MST  22 y.o. female  Admitting Dx: Acute lymphocytic meningitis  ASSESSMENT: Pt admitted with left sided weakness, neck pain, and headache.  Pt with h/o TB and HIV. RD met with pt who reports poor intake over the past 1 month.  Pt reports she typically eats fruits and vegetables. She also drinks 2 cups cow's milk daily. Pt states she may also eat fish from time to time, however has not recently been able to cook a lot due to lethargy/weakness.  She states she has mostly been using her juicer due to decreased ability to chew. Pt states she has consumed Ensure in the past, however recently has stopped due to expense and inability to get extra sample from PCP. Pt reports 10 lbs wt loss in the past 1 month (7% her usual wt), and decreased PO intake.    Nutrition Focused Physical Exam:  Subcutaneous Fat:  Orbital Region: WNL Upper Arm Region: WNL Thoracic and Lumbar Region: WNL  Muscle:  Temple Region: WNL Clavicle Bone Region: WNL Clavicle and Acromion Bone Region: WNL Scapular Bone Region: WNL Dorsal Hand: WNL Patellar Region: WNL Anterior Thigh Region: WNL Posterior Calf Region: WNL  Edema: none present    Pt is at risk for malnutrition given weakness and medical condition.  Willing to use supplements as inpatient.  Will continue to follow.  Height: Ht Readings from Last 1 Encounters:  08/26/13 5\' 5"  (1.651 m)    Weight: Wt Readings from  Last 1 Encounters:  08/26/13 130 lb 4.8 oz (59.104 kg)    Ideal Body Weight: 125 lbs  % Ideal Body Weight: 105%  Wt Readings from Last 10 Encounters:  08/26/13 130 lb 4.8 oz (59.104 kg)  07/21/13 133 lb (60.328 kg)  07/10/13 139 lb 15.9 oz (63.501 kg)  07/07/13 136 lb (61.689 kg)  06/23/13 137 lb (62.143 kg)  06/14/13 138 lb (62.596 kg)  05/30/13 142 lb (64.411 kg)  05/03/13 146 lb (66.225 kg)  03/11/13 141 lb 4 oz (64.071 kg)  02/28/13 143 lb (64.864 kg)    Usual Body Weight: 140 lbs per pt  % Usual Body Weight: 92%  BMI:  Body mass index is 21.68 kg/(m^2).  Estimated Nutritional Needs: Kcal: 1191-4782 Protein: 71-90g Fluid: ~1.8 L/day  Skin: intact  Diet Order: General  EDUCATION NEEDS: -Education needs addressed   Intake/Output Summary (Last 24 hours) at 08/26/13 1615 Last data filed at 08/26/13 1421  Gross per 24 hour  Intake   1132 ml  Output    301 ml  Net    831 ml    Last BM: 9/11   Labs:   Recent Labs Lab 08/25/13 1850  NA 140  K 3.8  CL 104  CO2 25  BUN 11  CREATININE 0.77  CALCIUM 9.6  GLUCOSE 81    CBG (last 3)  No results found for this basename: GLUCAP,  in the last 72 hours  Scheduled Meds: . acyclovir  600 mg Intravenous Once  . acyclovir  600 mg Intravenous Q8H  . ampicillin (OMNIPEN) IV  2 g Intravenous Q4H  . ceFEPime (MAXIPIME) IV  2 g Intravenous Q8H  . Darunavir Ethanolate  800 mg Oral Q breakfast  . emtricitabine-tenofovir  1 tablet Oral Daily  . enoxaparin (LOVENOX) injection  40 mg Subcutaneous Q24H  . [START ON 08/27/2013] influenza vac split quadrivalent PF  0.5 mL Intramuscular Tomorrow-1000  . ritonavir  100 mg Oral Q breakfast  . sodium chloride  3 mL Intravenous Q12H  . vancomycin  1,000 mg Intravenous Q8H    Continuous Infusions:   Past Medical History  Diagnosis Date  . Tuberculosis   . HIV (human immunodeficiency virus infection) 02/2012  . Pelvic pain     Past Surgical History  Procedure  Laterality Date  . Esophagogastroduodenoscopy  03/11/2012    Procedure: ESOPHAGOGASTRODUODENOSCOPY (EGD);  Surgeon: Hart Carwin, MD;  Location: [facility] ENDOSCOPY;  Service: Endoscopy;  Laterality: N/A;  . Appendectomy  ~ 2000  . Lung biopsy  02/2012  . Dilation and curettage of uterus  2008    Loyce Dys, MS RD LDN Clinical Inpatient Dietitian Pager: [phone] Weekend/After hours pager: [phone]

## 2013-08-26 NOTE — H&P (Signed)
Date: 08/26/2013               Patient Name:  Kelsey Rodriguez MRN: [id number]  DOB: [birth year] Age / Sex: 22 y.o., female   PCP: No primary provider on file.         Medical Service: Internal Medicine Teaching Service         Attending Physician: Dr. Gilda Crease, *    First Contact: Dr. Windell Hummingbird, MD Pager: [phone]  Second Contact: Dr. Charlsie Merles, MD Pager: [phone]       After Hours (After 5p/  First Contact Pager: [phone]  weekends / holidays): Second Contact Pager: [phone]   Chief Complaint: Left face weakness  History of Present Illness: Kelsey Rodriguez is a 22 y.o. female w/ a pmhx of HIV, TB, and recent aseptic meningitis(discharge on 7/24) who comes to the hospital w/ a cc of left face weakness and numbness. The patient had a constant headache and neck pain since her last admission that had not increased or changed in the last week. Over the last 24 hours the patient reports an acute weakness of the muscles on the left side of her face. She also reports not tasting normally on the left side of her tongue and that she is having trouble chewing. She reports a fever of 101 1 week ago. She admits to intermittent chills. No night sweats. She admits to one episode of dizziness while walking. She also admit to an episode of weakness during this time.   The patients headache is at baseline from the last month. It starts on the back of the head on the left side and radiates to the front of the head. The pain is associated with a tight feeling on her neck that radiates to her left shoulder.  Meds: Current Facility-Administered Medications  Medication Dose Route Frequency Provider Last Rate Last Dose  . acyclovir (ZOVIRAX) 600 mg in dextrose 5 % 100 mL IVPB  600 mg Intravenous Once Gilda Crease, MD      . cefoTAXime (CLAFORAN) 2 g in dextrose 5 % 50 mL IVPB  2 g Intravenous Once Gilda Crease, MD      . dexamethasone (DECADRON) injection 10 mg  10 mg  Intravenous Once Gilda Crease, MD      . vancomycin (VANCOCIN) IVPB 1000 mg/200 mL premix  1,000 mg Intravenous Once Colleen Can, [facility]      . vancomycin (VANCOCIN) IVPB 1000 mg/200 mL premix  1,000 mg Intravenous Q8H Veronda Hollie Salk, [facility]       Current Outpatient Prescriptions  Medication Sig Dispense Refill  . Darunavir Ethanolate (PREZISTA) 800 MG tablet Take 800 mg by mouth daily.      [name] dronabinol (MARINOL) 2.5 MG capsule Take 1 capsule (2.5 mg total) by mouth 2 (two) times daily before a meal.  60 capsule  2  . emtricitabine-tenofovir (TRUVADA) 200-300 MG per tablet Take 1 tablet by mouth daily.      . ferrous sulfate 325 (65 FE) MG tablet Take 325 mg by mouth 2 (two) times daily.      [name] HYDROcodone-acetaminophen (NORCO/VICODIN) 5-325 MG per tablet Take 1 tablet by mouth every 6 (six) hours as needed for pain.  45 tablet  0  . Multiple Vitamin (MULTIVITAMIN WITH MINERALS) TABS Take 1 tablet by mouth daily.      . naproxen (NAPROSYN) 375 MG tablet Take 1 tablet (375 mg total) by mouth 2 (two) times daily.  20  tablet  0  . ritonavir (NORVIR) 100 MG capsule Take 100 mg by mouth daily.      . traMADol (ULTRAM) 50 MG tablet Take 50 mg by mouth every 6 (six) hours as needed for pain.      . fluconazole (DIFLUCAN) 150 MG tablet Take 1 tablet (150 mg total) by mouth once.  1 tablet  0    Allergies: Allergies as of 08/25/2013  . (No Known Allergies)   Past Medical History  Diagnosis Date  . Tuberculosis   . HIV (human immunodeficiency virus infection) 02/2012  . Pelvic pain    Past Surgical History  Procedure Laterality Date  . Esophagogastroduodenoscopy  03/11/2012    Procedure: ESOPHAGOGASTRODUODENOSCOPY (EGD);  Surgeon: Hart Carwin, MD;  Location[facility] ENDOSCOPY;  Service: Endoscopy;  Laterality: N/A;  . Appendectomy  ~ 2000  . Lung biopsy  02/2012  . Dilation and curettage of uterus  2008   History reviewed. No pertinent family history. History   Social  History  . Marital Status: Single    Spouse Name: N/A    Number of Children: N/A  . Years of Education: N/A   Occupational History  . Not on file.   Social History Main Topics  . Smoking status: Never Smoker   . Smokeless tobacco: Never Used  . Alcohol Use: No     Comment: socially  . Drug Use: No  . Sexual Activity: Not Currently    Partners: Male    Birth Control/ Protection: None     Comment: pt. given condoms   Other Topics Concern  . Not on file   Social History Narrative  . No narrative on file    Review of Systems: Pertinent items are noted in HPI.  Physical Exam: Blood pressure 112/75, pulse 61, temperature 98.1 F (36.7 C), temperature source Oral, resp. rate 18, last menstrual period 08/22/2013, SpO2 100.00%. Physical Exam  Constitutional: She is oriented to person, place, and time. She appears well-developed and well-nourished.  HENT:  Head: Normocephalic.  Mouth/Throat: Oropharynx is clear and moist. No oropharyngeal exudate.  Eyes: EOM are normal. Pupils are equal, round, and reactive to light.  Discs crisp bil, no papilledema   Neck: Normal range of motion. Neck supple. No tracheal deviation present. No thyromegaly present.  Cardiovascular: Normal rate, regular rhythm, normal heart sounds and intact distal pulses.  Exam reveals no friction rub.   No murmur heard. Pulmonary/Chest: Effort normal and breath sounds normal. No respiratory distress. She has no wheezes. She has no rales. She exhibits no tenderness.  Abdominal: Soft. Bowel sounds are normal.  Musculoskeletal: She exhibits tenderness.  Increased tone in trap left side that reproduces the patients neck pain.  Lymphadenopathy:    She has no cervical adenopathy.  Neurological: She is alert and oriented to person, place, and time. A cranial nerve deficit is present. Coordination normal.  CN VII decreased on left side  Skin: No rash noted.  Psychiatric: She has a normal mood and affect. Her  behavior is normal.     Lab results: Basic Metabolic Panel:  Recent Labs  16/10/96 1850  NA 140  K 3.8  CL 104  CO2 25  GLUCOSE 81  BUN 11  CREATININE 0.77  CALCIUM 9.6   CBC:  Recent Labs  08/25/13 1850  WBC 3.2*  NEUTROABS 1.0*  HGB 10.3*  HCT 30.4*  MCV 85.9  PLT 207   Misc. Labs: LP: Glu: 50, TP: 64, WBC: 51, Lymphs: 98%, Clear  Base line LP (07/08/13): Glu 38, TP 147, WBC 358, Lymphs 96%,   Imaging results:  Ct Head Wo Contrast  08/25/2013   *RADIOLOGY REPORT*  Clinical Data: Headache  CT HEAD WITHOUT CONTRAST  Technique:  Contiguous axial images were obtained from the base of the skull through the vertex without contrast.  Comparison: 07/07/2013  Findings: The brain has a normal appearance without evidence for hemorrhage, infarction, hydrocephalus, or mass lesion.  There is no extra axial fluid collection.  The skull and paranasal sinuses are normal.  IMPRESSION: Normal exam.   Original Report Authenticated By: Signa Kell, M.D.   Assessment & Plan by Problem: Principal Problem:   Acute lymphocytic meningitis Active Problems:   Anemia of chronic disease   HIV (human immunodeficiency virus infection)   Bell's palsy  # Acute lymphocytic meningitis c/b focal neurologic findings The patient appears to have resolving acute lymphocytic meningitis. LP is improved from last admission with 1 RBC making SAH hemorrhage unlikely. This could represent TB meningitis, but Acid fast culture is negative from last admission, and TB PCR is also Negative. Viral w/u was also negative from last admission. It could also be due to lymphoma, CT negative at this admission (10% miss rate).  In terms of the patients facial weakness, which could be explained as Bells palsy, the additional neurologic manifestations of decreased sensation make CVA a possibility. ID was consutled by the ED and recommended while the infectious etiologies are less likely,  we have a low threshold to treat  empirically due to the patients immunocompromised status.  - Acyclovir, Ampicillin, Cefotax, Vanc - F/U Cultures and cytologies - Consider MR w and w/o head to rule out anatomic/ischemic causes. - Of note, 1 dose of decadron in ED, do not plan to continue  # Neck pain The patient is tender to palpation of the left trapezius muscle with increased tone. The patients neck pain appears to be due to myofascial pain. Increased muscle tone of the trapezius is a known cause of occipital neuralgia, which is consistent with the patients headache as well. - IV morphine  # HIV - Continue home meds  Dispo: Disposition is deferred at this time, awaiting improvement of current medical problems. Anticipated discharge in approximately 1-2 day(s).   The patient does have a current PCP (No primary provider on file.) and does not need an [facility] hospital follow-up appointment after discharge.  The patient does not have transportation limitations that hinder transportation to clinic appointments.  Signed: Pleas Koch, MD 08/26/2013, 1:51 AM

## 2013-08-26 NOTE — Consult Note (Addendum)
INFECTIOUS DISEASE CONSULT NOTE  Date of Admission:  08/25/2013  Date of Consult:  08/26/2013  Reason for Consult: Aseptic Meningitis Referring Physician: Kem Kays  Impression/Recommendation Left-sided facial numbness and weakness. Aseptic Meningitis HIV+  Would Send CSF for tb pcr Send csf for enterovirus, HSV, VZV, arbovirus panel, ebv (pcrs).  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 No history of her having head trauma borscht is a description of persistent rhinorrhea. Her most recent MRI is negative. Aside from the above list of infectious etiologies, lupus could certainly be a consideration (all is very uncommon and HIV-positive patients). Typically these cases are most commonly caused by HSV.  Thank you so much for this interesting consult,   Johny Sax (pager) [phone] [email or web address]  Kelsey Rodriguez is an 22 y.o. female.  HPI: History of HIV positive since 2013, immigrated from [place] in 2011. She's previously been treated for tuberculosis. She was previously admitted to [facility] in July of 2014 with aseptic meningitis. She had a CSF PCR for TB/HSV/enterovirus (-) at that time. Her crypto Ag was (-) as well.   She returned on 08/25/2013 with headache and neck pain. 24 hours prior to admission, she developed numbness on the left side of her face. She lost a sense of taste. And she was a little closer mouth daily. She states these she had a temperature to 101 at home. She had a posterior and left-sided headache as well. Since being in the hospital she feels better now.   HIV 1 RNA Quant (copies/mL)  Date Value  07/21/2013 685*  06/23/2013 4231*  03/11/2013 <20      CD4 T Cell Abs (cmm)  Date Value  07/21/2013 330*  06/23/2013 240*  03/11/2013 220*     Past Medical History  Diagnosis Date  . Tuberculosis   . HIV (human immunodeficiency virus infection)  02/2012  . Pelvic pain     Past Surgical History  Procedure Laterality Date  . Esophagogastroduodenoscopy  03/11/2012    Procedure: ESOPHAGOGASTRODUODENOSCOPY (EGD);  Surgeon: Hart Carwin, MD;  Location: [facility] ENDOSCOPY;  Service: Endoscopy;  Laterality: N/A;  . Appendectomy  ~ 2000  . Lung biopsy  02/2012  . Dilation and curettage of uterus  2008     No Known Allergies  Medications:  Scheduled: . acyclovir  600 mg Intravenous Once  . acyclovir  600 mg Intravenous Q8H  . ampicillin (OMNIPEN) IV  2 g Intravenous Q4H  . ceFEPime (MAXIPIME) IV  2 g Intravenous Q8H  . Darunavir Ethanolate  800 mg Oral Q breakfast  . emtricitabine-tenofovir  1 tablet Oral Daily  . enoxaparin (LOVENOX) injection  40 mg Subcutaneous Q24H  . [START ON 08/27/2013] feeding supplement  237 mL Oral BID BM  . [START ON 08/27/2013] influenza vac split quadrivalent PF  0.5 mL Intramuscular Tomorrow-1000  . ritonavir  100 mg Oral Q breakfast  . sodium chloride  3 mL Intravenous Q12H  . vancomycin  1,000 mg Intravenous Q8H    Total days of antibiotics: 2 (cefepime/amp/vanco/acyclovir)          Social History:  reports that she has never smoked. She has never used smokeless tobacco. She reports that she does not drink alcohol or use illicit drugs.  History reviewed. No pertinent family history.  General ROS: She has had headaches. Minimal photophobia. Neck stiffness while she had headaches and attempting to lean forward. She has  had no dysphagia. She has had mosquito bites recently. No rashes. No missed antiretroviral therapy. No diarrhea. No dysuria. See history of present illness.  Blood pressure 114/75, pulse 82, temperature 97.6 F (36.4 C), temperature source Oral, resp. rate 18, height 5\' 5"  (1.651 m), weight 59.104 kg (130 lb 4.8 oz), last menstrual period 08/25/2013, SpO2 100.00%. General appearance: alert, cooperative and no distress Eyes: negative findings: pupils equal, round, reactive to light and  accomodation and no photophobia Throat: lips, mucosa, and tongue normal; teeth and gums normal Neck: no adenopathy, supple, symmetrical, trachea midline and no meningismus Lungs: clear to auscultation bilaterally Heart: regular rate and rhythm Abdomen: normal findings: bowel sounds normal and soft, non-tender Extremities: edema none Neurologic: Mental status: Alert, oriented, thought content appropriate Cranial nerves: VII: lower facial muscle function abnormal on the left Motor: strength is 5/5 UE and LE   Results for orders placed during the hospital encounter of 08/25/13 (from the past 48 hour(s))  CBC WITH DIFFERENTIAL     Status: Abnormal   Collection Time    08/25/13  6:50 PM      Result Value Range   WBC 3.2 (*) 4.0 - 10.5 K/uL   RBC 3.54 (*) 3.87 - 5.11 MIL/uL   Hemoglobin 10.3 (*) 12.0 - 15.0 g/dL   HCT 16.1 (*) 09.6 - 04.5 %   MCV 85.9  78.0 - 100.0 fL   MCH 29.1  26.0 - 34.0 pg   MCHC 33.9  30.0 - 36.0 g/dL   RDW 40.9 (*) 81.1 - 91.4 %   Platelets 207  150 - 400 K/uL   Neutrophils Relative % 32 (*) 43 - 77 %   Neutro Abs 1.0 (*) 1.7 - 7.7 K/uL   Lymphocytes Relative 48 (*) 12 - 46 %   Lymphs Abs 1.6  0.7 - 4.0 K/uL   Monocytes Relative 14 (*) 3 - 12 %   Monocytes Absolute 0.4  0.1 - 1.0 K/uL   Eosinophils Relative 6 (*) 0 - 5 %   Eosinophils Absolute 0.2  0.0 - 0.7 K/uL   Basophils Relative 0  0 - 1 %   Basophils Absolute 0.0  0.0 - 0.1 K/uL  BASIC METABOLIC PANEL     Status: None   Collection Time    08/25/13  6:50 PM      Result Value Range   Sodium 140  135 - 145 mEq/L   Potassium 3.8  3.5 - 5.1 mEq/L   Chloride 104  96 - 112 mEq/L   CO2 25  19 - 32 mEq/L   Glucose, Bld 81  70 - 99 mg/dL   BUN 11  6 - 23 mg/dL   Creatinine, Ser 7.82  0.50 - 1.10 mg/dL   Calcium 9.6  8.4 - 95.6 mg/dL   GFR calc non Af Amer >90  >90 mL/min   GFR calc Af Amer >90  >90 mL/min   Comment: (NOTE)     The eGFR has been calculated using the CKD EPI equation.     This  calculation has not been validated in all clinical situations.     eGFR's persistently <90 mL/min signify possible Chronic Kidney     Disease.  CSF CELL COUNT WITH DIFFERENTIAL     Status: Abnormal   Collection Time    08/25/13 10:42 PM      Result Value Range   Tube # 3     Color, CSF COLORLESS  COLORLESS   Appearance, CSF CLEAR  CLEAR   Supernatant NOT INDICATED     RBC Count, CSF 1 (*) 0 /cu mm   WBC, CSF 51 (*) 0 - 5 /cu mm   Comment: CRITICAL RESULT CALLED TO, READ BACK BY AND VERIFIED WITH:     ANNA LULIS RN [phone] [id number] GREEN R   Lymphs, CSF 98 (*) 40 - 80 %   Monocyte-Macrophage-Spinal Fluid 2 (*) 15 - 45 %  CSF CULTURE     Status: None   Collection Time    08/25/13 10:42 PM      Result Value Range   Specimen Description CSF     Special Requests NO 2 1CC     Gram Stain       Value: CYTOSPIN SLIDE WBC PRESENT, PREDOMINANTLY MONONUCLEAR     NO ORGANISMS SEEN     Performed at [facility]   Culture PENDING     Report Status PENDING    GRAM STAIN     Status: None   Collection Time    08/25/13 10:42 PM      Result Value Range   Specimen Description CSF     Special Requests NO 2 1CC     Gram Stain       Value: CYTOSPUN     WBC PRESENT, PREDOMINANTLY MONONUCLEAR     NO ORGANISMS SEEN   Report Status 08/26/2013 FINAL    GLUCOSE, CSF     Status: None   Collection Time    08/25/13 10:42 PM      Result Value Range   Glucose, CSF 50  43 - 76 mg/dL  PROTEIN, CSF     Status: Abnormal   Collection Time    08/25/13 10:42 PM      Result Value Range   Total  Protein, CSF 64 (*) 15 - 45 mg/dL  PATHOLOGIST SMEAR REVIEW     Status: None   Collection Time    08/25/13 10:42 PM      Result Value Range   Path Review REACTIVE LYMPHOID POPULATION. (DR.SMIR     Comment: HAS SEEN THIS CASE IN CONSULTATION     WITH AGREEMENT.)     Reviewed by Elana Alm. Hillard, MD     08/26/13      Component Value Date/Time   SDES CSF 08/25/2013 2242   SDES CSF 08/25/2013 2242    SPECREQUEST NO 2 1CC 08/25/2013 2242   SPECREQUEST NO 2 1CC 08/25/2013 2242   CULT PENDING 08/25/2013 2242   REPTSTATUS PENDING 08/25/2013 2242   REPTSTATUS 08/26/2013 FINAL 08/25/2013 2242   Ct Head Wo Contrast  08/25/2013   *RADIOLOGY REPORT*  Clinical Data: Headache  CT HEAD WITHOUT CONTRAST  Technique:  Contiguous axial images were obtained from the base of the skull through the vertex without contrast.  Comparison: 07/07/2013  Findings: The brain has a normal appearance without evidence for hemorrhage, infarction, hydrocephalus, or mass lesion.  There is no extra axial fluid collection.  The skull and paranasal sinuses are normal.  IMPRESSION: Normal exam.   Original Report Authenticated By: Signa Kell, M.D.   Recent Results (from the past 240 hour(s))  CSF CULTURE     Status: None   Collection Time    08/25/13 10:42 PM      Result Value Range Status   Specimen Description CSF   Final   Special Requests NO 2 1CC   Final   Gram Stain     Final   Value: CYTOSPIN SLIDE WBC PRESENT,  PREDOMINANTLY MONONUCLEAR     NO ORGANISMS SEEN     Performed at [facility]   Culture PENDING   Incomplete   Report Status PENDING   Incomplete  GRAM STAIN     Status: None   Collection Time    08/25/13 10:42 PM      Result Value Range Status   Specimen Description CSF   Final   Special Requests NO 2 1CC   Final   Gram Stain     Final   Value: CYTOSPUN     WBC PRESENT, PREDOMINANTLY MONONUCLEAR     NO ORGANISMS SEEN   Report Status 08/26/2013 FINAL   Final      08/26/2013, 2:44 PM     LOS: 1 day

## 2013-08-26 NOTE — Progress Notes (Signed)
Subjective: No acute events overnight. Remains afebrile. Her headache has improved although is still present---localized to the occipital region with radiation down her left trapezius. Continues to have left facial weakness and decreased sensation that is unchanged from yesterday.    Objective: Vital signs in last 24 hours: Filed Vitals:   08/25/13 2120 08/26/13 0251 08/26/13 0321 08/26/13 0357  BP: 112/75 114/83 109/77 112/73  Pulse: 61 63 59 62  Temp: 98.1 F (36.7 C)   97.7 F (36.5 C)  TempSrc: Oral   Oral  Resp:   19 16  Height:    5\' 5"  (1.651 m)  Weight:    59.104 kg (130 lb 4.8 oz)  SpO2: 100% 100% 100% 100%   Weight change:   Intake/Output Summary (Last 24 hours) at 08/26/13 1044 Last data filed at 08/26/13 0948  Gross per 24 hour  Intake    892 ml  Output      1 ml  Net    891 ml   Physical Examination Vitals reviewed. General: Alert and oriented, sitting up in bed, NAD HEENT: normocepahlic, EOM intact, pupils equal, round, and reactive to light, MMM, oropharynx clear Neck: supple, no lymphadenopathy CV: regular rate and rhythm, no murmurs, rubs, or gallops Lungs: CTAB, no wheezing, rhonchi, rales, normal work of breathing Abd: soft, non-tender, non-distended, normoactive bowel sounds MSK: Tender to palpation over the left trapezius, 4/5 strength with left shoulder shrug, normal grip strength bilaterally  Neuro: PERRLA, EOMI, decreased sensation to light touch through V1-V3 distribution on left face, asymmetric smile (droop on left), 4/5 strength of left orbicularis oculi, decreased eyebrow raise on left, hearing intact bilaterally, 4/5 strength with left shoulder shrug, tongue midline Skin: warm, no rashes noted Psych: alert and oriented, normal mood and affect.   Lab Results:  Micro Results: Recent Results (from the past 240 hour(s))  CSF CULTURE     Status: None   Collection Time    08/25/13 10:42 PM      Result Value Range Status   Specimen Description  CSF   Final   Special Requests NO 2 1CC   Final   Gram Stain     Final   Value: CYTOSPIN SLIDE WBC PRESENT, PREDOMINANTLY MONONUCLEAR     NO ORGANISMS SEEN     Performed at [facility]   Culture PENDING   Incomplete   Report Status PENDING   Incomplete  GRAM STAIN     Status: None   Collection Time    08/25/13 10:42 PM      Result Value Range Status   Specimen Description CSF   Final   Special Requests NO 2 1CC   Final   Gram Stain     Final   Value: CYTOSPUN     WBC PRESENT, PREDOMINANTLY MONONUCLEAR     NO ORGANISMS SEEN   Report Status 08/26/2013 FINAL   Final   Studies/Results: Ct Head Wo Contrast  08/25/2013   *RADIOLOGY REPORT*  Clinical Data: Headache  CT HEAD WITHOUT CONTRAST  Technique:  Contiguous axial images were obtained from the base of the skull through the vertex without contrast.  Comparison: 07/07/2013  Findings: The brain has a normal appearance without evidence for hemorrhage, infarction, hydrocephalus, or mass lesion.  There is no extra axial fluid collection.  The skull and paranasal sinuses are normal.  IMPRESSION: Normal exam.   Original Report Authenticated By: Signa Kell, M.D.   Medications: I have reviewed the patient's current medications. Scheduled Meds: .  acyclovir  600 mg Intravenous Once  . acyclovir  600 mg Intravenous Q8H  . ampicillin (OMNIPEN) IV  2 g Intravenous Q4H  . ceFEPime (MAXIPIME) IV  2 g Intravenous Q8H  . Darunavir Ethanolate  800 mg Oral Q breakfast  . emtricitabine-tenofovir  1 tablet Oral Daily  . enoxaparin (LOVENOX) injection  40 mg Subcutaneous Q24H  . [START ON 08/27/2013] influenza vac split quadrivalent PF  0.5 mL Intramuscular Tomorrow-1000  . ritonavir  100 mg Oral Q breakfast  . sodium chloride  3 mL Intravenous Q12H  . vancomycin  1,000 mg Intravenous Q8H   Continuous Infusions:  PRN Meds:.sodium chloride, morphine injection, sodium chloride  Assessment/Plan: 22 yo female with PMH of HIV, TB, and recent  aseptic meningitis (discharge on 7/24), who presents with the chief complaint of left facial weakness and numbness in the setting of constant headache and neck pain since her last admission.  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 CSF on admission showed prot 64, gluc 50, RBC 1, WBC 51--98% lymphs, gram stain neg consistent with lymphocytic meningitis. This is improved from her last admission in July at which time her CSF showed prot 147, gluc 38, WBC 358 with negative HSV, crypto, TB, and enterovirus. The findings of left facial weakness (non-forehead sparing) and numbness are concerning for lesions involving CN V and VII which would localize to the left brainstem. CT on admission was negative for an intracranial process however this is not the best imaging modality for identifying lesions or ischemia. ID was consulted at the time of admission and while infectious etiologies are less likely, they recommended treatment given her immunocompromised status.  - Continue acyclovir, ampicillin, cefepime, and vancomycin per ID - ID consulted, appreciated recs - MR w/wo contrast to evaluate for anatomic/ischemic causes of her focal neurologic deficits - f/u CSF cultures and cytologies   # Headache: Pt has constant occipital headache since her last admission. She is tender to palpation over her left trapezius with notable increased tone. She is also tender over the occipital region which is consistent with possible occipital neuralgia. Meningeal irritation is also likely to be playing a role in her headache.   - IV morphine prn  - Ibuprofen 600mg  prn   #HIV: Diagnosed in April 2014. Last CD4 count was 330 on 8/7. Currently on HAART therapy.  - continue home Prezista, Truvada, Norvir  This is a Psychologist, occupational Note.  The care of the patient was discussed with Dr. Sherrine Maples and the assessment and plan  formulated with their assistance.  Please see their attached note for official documentation of the daily encounter.   LOS: 1 day   [employer], Med Student 08/26/2013, 10:44 AM

## 2013-08-26 NOTE — Progress Notes (Signed)
08/26/13 Pt.arrived to the unit from the ED around 0320. She arrived by stretcher and able to ambulate independently. She arrived with IV pump and without oxygen. Pt.is on droplet precautions. She has had no c/o pain or signs of distress during the shift.

## 2013-08-26 NOTE — Progress Notes (Addendum)
ANTIBIOTIC CONSULT NOTE - INITIAL  Pharmacy Consult for vancomycin and cefepime Indication: r/o bacterial meningitis  No Known Allergies  Patient Measurements: Weight: 60kg  Vital Signs: Temp: 98.1 F (36.7 C) (09/11 2120) Temp src: Oral (09/11 2120) BP: 112/75 mmHg (09/11 2120) Pulse Rate: 61 (09/11 2120)  Labs:  Recent Labs  08/25/13 1850  WBC 3.2*  HGB 10.3*  PLT 207  CREATININE 0.77     Microbiology: Recent Results (from the past 720 hour(s))  GRAM STAIN     Status: None   Collection Time    08/25/13 10:42 PM      Result Value Range Status   Specimen Description CSF   Final   Special Requests NO 2 1CC   Final   Gram Stain     Final   Value: CYTOSPUN     WBC PRESENT, PREDOMINANTLY MONONUCLEAR     NO ORGANISMS SEEN   Report Status 08/26/2013 FINAL   Final    Medical History: Past Medical History  Diagnosis Date  . Tuberculosis   . HIV (human immunodeficiency virus infection) 02/2012  . 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  Goal of Therapy:  Vancomycin trough level 15-20 mcg/ml  Plan:  Will start vancomycin 1000mg  IV Q8H and cefepime 2g IV Q8H and monitor CBC, Cx, levels prn.  Vernard Gambles, PharmD, BCPS  08/26/2013,1:21 AM

## 2013-08-26 NOTE — Progress Notes (Signed)
I have seen the patient and reviewed the daily progress note by Davina Poke, MS-IV and discussed the care of the patient with them.  See below for documentation of my findings, assessment, and plans.  Subjective: No overnight events. Pt states that she is feeling better today. She has remained afebrile. She is still having a headache and left sided neck pain radiating into her back that is present with palpation. Pt states that her left facial weakness is unchanged from admission.   Objective: Vital signs in last 24 hours: Filed Vitals:   08/25/13 2120 08/26/13 0251 08/26/13 0321 08/26/13 0357  BP: 112/75 114/83 109/77 112/73  Pulse: 61 63 59 62  Temp: 98.1 F (36.7 C)   97.7 F (36.5 C)  TempSrc: Oral   Oral  Resp:   19 16  Height:    5\' 5"  (1.651 m)  Weight:    130 lb 4.8 oz (59.104 kg)  SpO2: 100% 100% 100% 100%   Weight change:   Intake/Output Summary (Last 24 hours) at 08/26/13 1233 Last data filed at 08/26/13 0948  Gross per 24 hour  Intake    892 ml  Output      1 ml  Net    891 ml   Vitals reviewed. General: Sitting up in bed, NAD HEENT: PERRL, EOMI, no scleral icterus Cardiac: RRR, no rubs, murmurs or gallops Pulm: clear to auscultation bilaterally, no wheezes, rales, or rhonchi Abd: soft, nontender, nondistended, BS present MSK: Tender to palpation over the left trapezius, 4/5 strength with left shoulder shrug, normal grip strength bilaterally  Neuro: PERRLA, EOMI, decreased sensation to light touch through V1-V3 distribution on left face, asymmetric smile (droop on left), 4/5 strength of left orbicularis oculi, decreased eyebrow raise on left, hearing intact bilaterally, 4/5 strength with left shoulder shrug, tongue midline  Skin: warm, no rashes noted  Psych: alert and oriented, normal mood and affect.    Lab Results: Reviewed and documented in Electronic Record Micro Results: Reviewed and documented in Electronic Record Studies/Results: Reviewed  and documented in Electronic Record Medications: I have reviewed the patient's current medications. Scheduled Meds: . acyclovir  600 mg Intravenous Once  . acyclovir  600 mg Intravenous Q8H  . ampicillin (OMNIPEN) IV  2 g Intravenous Q4H  . ceFEPime (MAXIPIME) IV  2 g Intravenous Q8H  . Darunavir Ethanolate  800 mg Oral Q breakfast  . emtricitabine-tenofovir  1 tablet Oral Daily  . enoxaparin (LOVENOX) injection  40 mg Subcutaneous Q24H  . [START ON 08/27/2013] influenza vac split quadrivalent PF  0.5 mL Intramuscular Tomorrow-1000  . ritonavir  100 mg Oral Q breakfast  . sodium chloride  3 mL Intravenous Q12H  . vancomycin  1,000 mg Intravenous Q8H   Continuous Infusions:  PRN Meds:.sodium chloride, morphine injection, sodium chloride  Assessment/Plan: 22 yo female with PMH of HIV, TB, and recent aseptic meningitis (discharge on 7/24), who presents with the chief complaint of left facial weakness and numbness in the setting of constant headache and neck pain since her last admission.   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 CSF on admission showed prot 64, gluc 50, RBC 1, WBC 51--98% lymphs, gram stain neg consistent with lymphocytic meningitis. This is improved from her last admission in July at which time her CSF showed prot 147, gluc 38, WBC 358 with negative HSV, crypto, TB, and enterovirus. The findings of left facial  weakness (non-forehead sparing) and numbness are concerning for lesions involving CN V and VII which would localize to the left brainstem. CT on admission was negative for an intracranial process however this is not the best imaging modality for identifying lesions or ischemia. ID was consulted at the time of admission and while infectious etiologies are less likely, they recommended broad spectrum treatment given her immunocompromised status.  - Continue acyclovir,  ampicillin, cefepime, and vancomycin per ID  - ID consulted, appreciated recs  - MR w/wo contrast to evaluate for anatomic/ischemic causes of her focal neurologic deficits  - F/u CSF cultures and cytologies   # Headache: Pt has constant occipital headache since her last admission. She is tender to palpation over her left trapezius with notable increased tone. She is also tender over the occipital region which is consistent with possible occipital neuralgia. Meningeal irritation is possibly playing a role in her headache, but she has no meningeal signs on exam. - Ibuprofen 600mg  prn   #HIV: Diagnosed in April 2013. Last CD4 count was 330 on 8/7. Currently on HAART therapy and is followed by Dr. Drue Second with ID.  - continue home Prezista, Truvada, Norvir  #DVT PPx: Lovenox  Dispo: Disposition is deferred at this time, awaiting improvement of current medical problems.  Anticipated discharge in approximately 2-4 day(s).   The patient does not have a current PCP (No primary provider on file.) and possibly needs an [facility] hospital follow-up appointment after discharge.  The patient does not have transportation limitations that hinder transportation to clinic appointments.  .Services Needed at time of discharge: Y = Yes, Blank = No PT:   OT:   RN:   Equipment:   Other:     LOS: 1 day   Genelle Gather, MD 08/26/2013, 12:33 PM

## 2013-08-27 LAB — CBC WITH DIFFERENTIAL/PLATELET
Basophils Absolute: 0 10*3/uL (ref 0.0–0.1)
Basophils Relative: 0 % (ref 0–1)
Eosinophils Absolute: 0 10*3/uL (ref 0.0–0.7)
Eosinophils Relative: 0 % (ref 0–5)
HCT: 32.7 % — ABNORMAL LOW (ref 36.0–46.0)
Hemoglobin: 10.6 g/dL — ABNORMAL LOW (ref 12.0–15.0)
Lymphocytes Relative: 13 % (ref 12–46)
Lymphs Abs: 1 10*3/uL (ref 0.7–4.0)
MCH: 28.2 pg (ref 26.0–34.0)
MCHC: 32.4 g/dL (ref 30.0–36.0)
MCV: 87 fL (ref 78.0–100.0)
Monocytes Absolute: 0.2 10*3/uL (ref 0.1–1.0)
Monocytes Relative: 3 % (ref 3–12)
Neutro Abs: 6.1 10*3/uL (ref 1.7–7.7)
Neutrophils Relative %: 84 % — ABNORMAL HIGH (ref 43–77)
Platelets: 215 10*3/uL (ref 150–400)
RBC: 3.76 MIL/uL — ABNORMAL LOW (ref 3.87–5.11)
RDW: 16 % — ABNORMAL HIGH (ref 11.5–15.5)
WBC: 7.3 10*3/uL (ref 4.0–10.5)

## 2013-08-27 LAB — BASIC METABOLIC PANEL
BUN: 10 mg/dL (ref 6–23)
CO2: 22 mEq/L (ref 19–32)
Calcium: 9.7 mg/dL (ref 8.4–10.5)
Chloride: 106 mEq/L (ref 96–112)
Creatinine, Ser: 0.58 mg/dL (ref 0.50–1.10)
GFR calc Af Amer: >90 mL/min (ref >90)
GFR calc non Af Amer: >90 mL/min (ref >90)
Glucose, Bld: 114 mg/dL — ABNORMAL HIGH (ref 70–99)
Potassium: 3.9 mEq/L (ref 3.5–5.1)
Sodium: 139 mEq/L (ref 135–145)

## 2013-08-27 MED ORDER — PREDNISONE 50 MG PO TABS
60.0000 mg | ORAL_TABLET | Freq: Every day | ORAL | Status: DC
  Administered 2013-08-27: 16:00:00 60 mg via ORAL
  Filled 2013-08-27 (×2): qty 1

## 2013-08-27 NOTE — Progress Notes (Signed)
Subjective: Pt states that she is feeling better today. She states that her headache and neck ache have improved. She feels that her LUE weakness has improved. She is still with left facial weakness and decreased sensation. She states that she can still not taste or feel texture in her left mouth.   Objective: Vital signs in last 24 hours: Filed Vitals:   08/26/13 0357 08/26/13 1424 08/26/13 2001 08/27/13 0522  BP: 112/73 114/75 99/58 97/54   Pulse: 62 82  51  Temp: 97.7 F (36.5 C) 97.6 F (36.4 C) 98.5 F (36.9 C) 97.4 F (36.3 C)  TempSrc: Oral Oral Oral Oral  Resp: 16 18 18 18   Height: 5\' 5"  (1.651 m)     Weight: 130 lb 4.8 oz (59.104 kg)   131 lb 4.8 oz (59.557 kg)  SpO2: 100% 100% 100% 100%   Weight change: 1 lb (0.454 kg)  Intake/Output Summary (Last 24 hours) at 08/27/13 1337 Last data filed at 08/27/13 1159  Gross per 24 hour  Intake   2376 ml  Output   1000 ml  Net   1376 ml   Vitals reviewed. General: Sitting up in bed, NAD HEENT: PERRL, EOMI, no scleral icterus Cardiac: RRR, no rubs, murmurs or gallops Pulm: Clear to auscultation bilaterally, no wheezes, rales, or rhonchi Abd: Soft, nontender, nondistended, BS present Ext: Warm and well perfused, no pedal edema. 4/5 strength with left shoulder shrug, normal grip strength bilaterally  Neuro: Alert and oriented X3, decreased sensation to light touch through V1-V3 distribution on left face, asymmetric smile (droop on left), 4/5 strength of left orbicularis oculi, decreased eyebrow raise on left, hearing intact bilaterally, 4/5 strength with left shoulder shrug, tongue midline    Lab Results: Basic Metabolic Panel:  Recent Labs Lab 08/25/13 1850 08/27/13 0645  NA 140 139  K 3.8 3.9  CL 104 106  CO2 25 22  GLUCOSE 81 114*  BUN 11 10  CREATININE 0.77 0.58  CALCIUM 9.6 9.7   Liver Function Tests: No results found for this basename: AST, ALT, ALKPHOS, BILITOT, PROT, ALBUMIN,  in the last 168 hours No  results found for this basename: LIPASE, AMYLASE,  in the last 168 hours No results found for this basename: AMMONIA,  in the last 168 hours  CBC:  Recent Labs Lab 08/25/13 1850 08/27/13 0645  WBC 3.2* 7.3  NEUTROABS 1.0* 6.1  HGB 10.3* 10.6*  HCT 30.4* 32.7*  MCV 85.9 87.0  PLT 207 215   Cardiac Enzymes: No results found for this basename: CKTOTAL, CKMB, CKMBINDEX, TROPONINI,  in the last 168 hours BNP: No results found for this basename: PROBNP,  in the last 168 hours D-Dimer: No results found for this basename: DDIMER,  in the last 168 hours CBG: No results found for this basename: GLUCAP,  in the last 168 hours Hemoglobin A1C: No results found for this basename: HGBA1C,  in the last 168 hours Fasting Lipid Panel: No results found for this basename: CHOL, HDL, LDLCALC, TRIG, CHOLHDL, LDLDIRECT,  in the last 168 hours Thyroid Function Tests: No results found for this basename: TSH, T4TOTAL, FREET4, T3FREE, THYROIDAB,  in the last 168 hours Coagulation: No results found for this basename: LABPROT, INR,  in the last 168 hours Anemia Panel: No results found for this basename: VITAMINB12, FOLATE, FERRITIN, TIBC, IRON, RETICCTPCT,  in the last 168 hours Urinalysis: No results found for this basename: COLORURINE, APPERANCEUR, LABSPEC, PHURINE, GLUCOSEU, HGBUR, BILIRUBINUR, KETONESUR, PROTEINUR, UROBILINOGEN, NITRITE, LEUKOCYTESUR,  in the last 168 hours  Misc. Labs:   Micro Results: Recent Results (from the past 240 hour(s))  CULTURE, BLOOD (ROUTINE X 2)     Status: None   Collection Time    08/25/13  8:15 PM      Result Value Range Status   Specimen Description BLOOD ARM RIGHT   Final   Special Requests BOTTLES DRAWN AEROBIC AND ANAEROBIC 5CC   Final   Culture  Setup Time     Final   Value: 08/26/2013 01:57     Performed at [facility]   Culture     Final   Value:        BLOOD CULTURE RECEIVED NO GROWTH TO DATE CULTURE WILL BE HELD FOR 5 DAYS BEFORE ISSUING A  FINAL NEGATIVE REPORT     Performed at [facility]   Report Status PENDING   Incomplete  CSF CULTURE     Status: None   Collection Time    08/25/13 10:42 PM      Result Value Range Status   Specimen Description CSF   Final   Special Requests NO 2 1CC   Final   Gram Stain     Final   Value: CYTOSPIN SLIDE WBC PRESENT, PREDOMINANTLY MONONUCLEAR     NO ORGANISMS SEEN     Performed at [facility]   Culture     Final   Value: NO GROWTH 1 DAY     Performed at [facility]   Report Status PENDING   Incomplete  GRAM STAIN     Status: None   Collection Time    08/25/13 10:42 PM      Result Value Range Status   Specimen Description CSF   Final   Special Requests NO 2 1CC   Final   Gram Stain     Final   Value: CYTOSPUN     WBC PRESENT, PREDOMINANTLY MONONUCLEAR     NO ORGANISMS SEEN   Report Status 08/26/2013 FINAL   Final   Studies/Results: Ct Head Wo Contrast  08/25/2013   *RADIOLOGY REPORT*  Clinical Data: Headache  CT HEAD WITHOUT CONTRAST  Technique:  Contiguous axial images were obtained from the base of the skull through the vertex without contrast.  Comparison: 07/07/2013  Findings: The brain has a normal appearance without evidence for hemorrhage, infarction, hydrocephalus, or mass lesion.  There is no extra axial fluid collection.  The skull and paranasal sinuses are normal.  IMPRESSION: Normal exam.   Original Report Authenticated By: Signa Kell, M.D.   Mr Laqueta Jean Wo Contrast  08/26/2013   CLINICAL DATA:  Left facial numbness and tingling. History of HIV infection and meningitis.  EXAM: MRI HEAD WITHOUT AND WITH CONTRAST  TECHNIQUE: Multiplanar, multiecho pulse sequences of the brain and surrounding structures were obtained according to standard protocol without and with intravenous contrast  CONTRAST:  15mL MULTIHANCE GADOBENATE DIMEGLUMINE 529 MG/ML IV SOLN  COMPARISON:  CT 08/25/2013 and 07/07/2013  FINDINGS: The brain has normal appearance on all  pulse sequences without evidence of atrophy, old or acute infarction, mass lesion, hemorrhage, hydrocephalus or extra-axial collection. No pituitary mass. No inflammatory sinus disease. No skull or skullbase lesion. No abnormal enhancement of the brain or meninges.  IMPRESSION: Normal examination.   Electronically Signed   By: Paulina Fusi M.D.   On: 08/26/2013 16:07   Medications: I have reviewed the patient's current medications. Scheduled Meds: . acyclovir  600 mg Intravenous Once  .  acyclovir  600 mg Intravenous Q8H  . Darunavir Ethanolate  800 mg Oral Q breakfast  . emtricitabine-tenofovir  1 tablet Oral Daily  . enoxaparin (LOVENOX) injection  40 mg Subcutaneous Q24H  . feeding supplement  237 mL Oral BID BM  . influenza vac split quadrivalent PF  0.5 mL Intramuscular Tomorrow-1000  . predniSONE  60 mg Oral Q breakfast  . ritonavir  100 mg Oral Q breakfast  . sodium chloride  3 mL Intravenous Q12H   Continuous Infusions:  PRN Meds:.sodium chloride, ibuprofen, morphine injection, sodium chloride  Assessment/Plan: 22 yo female with PMH of HIV, TB, and recent aseptic meningitis (discharge on 7/24), who presents with the chief complaint of left facial weakness and numbness in the setting of constant headache and neck pain since her last admission.   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 CSF on admission showed prot 64, gluc 50, RBC 1, WBC 51--98% lymphs, gram stain neg consistent with lymphocytic meningitis. This is improved from her last admission in July at which time her CSF showed prot 147, gluc 38, WBC 358 with negative HSV, crypto, TB, and enterovirus. The findings of left facial weakness (non-forehead sparing) and numbness are concerning for lesions involving CN V and VII which would localize to the left brainstem. CT on admission was negative for an intracranial process however  this is not the best imaging modality for identifying lesions or ischemia. ID was consulted at the time of admission and while infectious etiologies are less likely, broad spectrum treatment was started given her immunocompromised status. MRI brain was normal. Per ID, stopping abx, but will continue acyclovir and steroids. - Continue acyclovir per ID  - Start Prednisone 60mg  po daily - ID consulted, appreciated recs  - F/u CSF cultures and cytologies   # Headache: Resolved. Pt presented with constant occipital headache since her last admission. She has been tender to palpation over her left trapezius with notable increased tone. She was also tender over the occipital region which was consistent with possible occipital neuralgia. However, today her symptoms have greatly improved and she states that her headache and neck pains have resolved.  - Ibuprofen 600mg  prn pain  #HIV: Diagnosed in April 2013. Last CD4 count was 330 on 8/7. Currently on HAART therapy and is followed by Dr. Drue Second with ID.  - continue home Prezista, Truvada, Norvir   #DVT PPx: Lovenox   Dispo: Disposition is deferred at this time, awaiting improvement of current medical problems.  Anticipated discharge in approximately 1-3 day(s).   The patient does not have a current PCP (No primary provider on file.) and might possibly need an [facility] hospital follow-up appointment after discharge.  The patient does not have transportation limitations that hinder transportation to clinic appointments.  .Services Needed at time of discharge: Y = Yes, Blank = No PT:   OT:   RN:   Equipment:   Other:     LOS: 2 days   Genelle Gather, MD 08/27/2013, 1:37 PM

## 2013-08-27 NOTE — Progress Notes (Signed)
Spoke with Infectious Disease doctor about Enterovirus specimen collection, there is not enough CSF to perform test and she is already aware of this.  Does not want blood drawn.

## 2013-08-27 NOTE — Progress Notes (Addendum)
[facility]    Date of Admission:  08/25/2013   Total days of antibiotics 3        Day 3 acyclovir        Day 3 ampicillin        Day 3 cefepime        Day 3 vanco   ID: Kelsey Rodriguez is a 22 y.o. 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 She is found to have lymphocytic predominance pleocytosis on CSF analysis, gram stain negative. HSV pcr negative. Principal Problem:   Acute lymphocytic meningitis Active Problems:   Anemia of chronic disease   HIV (human immunodeficiency virus infection)   Bell's palsy    Subjective: afebrile  Overnight: lab only able to send out limited CSF tests due to 1mL left of CSF  Medications:  . acyclovir  600 mg Intravenous Once  . acyclovir  600 mg Intravenous Q8H  . ampicillin (OMNIPEN) IV  2 g Intravenous Q4H  . ceFEPime (MAXIPIME) IV  2 g Intravenous Q8H  . Darunavir Ethanolate  800 mg Oral Q breakfast  . emtricitabine-tenofovir  1 tablet Oral Daily  . enoxaparin (LOVENOX) injection  40 mg Subcutaneous Q24H  . feeding supplement  237 mL Oral BID BM  . influenza vac split quadrivalent PF  0.5 mL Intramuscular Tomorrow-1000  . ritonavir  100 mg Oral Q breakfast  . sodium chloride  3 mL Intravenous Q12H  . vancomycin  1,000 mg Intravenous Q8H    Objective: Vital signs in last 24 hours: Temp:  [97.4 F (36.3 C)-98.5 F (36.9 C)] 97.4 F (36.3 C) (09/13 0522) Pulse Rate:  [51-82] 51 (09/13 0522) Resp:  [18] 18 (09/13 0522) BP: (97-114)/(54-75) 97/54 mmHg (09/13 0522) SpO2:  [100 %] 100 % (09/13 0522) Weight:  [131 lb 4.8 oz (59.557 kg)] 131 lb 4.8 oz (59.557 kg) (09/13 0522) General: Sitting up in bed, NAD  HEENT: PERRL, EOMI, no scleral icterus  Cardiac: RRR, no rubs, murmurs or gallops  Pulm: clear to auscultation bilaterally, no wheezes, rales, or rhonchi  Abd: soft,  nontender, nondistended, BS present  MSK: Tender to palpation over the left trapezius, 4/5 strength with left shoulder shrug, normal grip strength bilaterally  Neuro: PERRLA, EOMI, decreased sensation to light touch through V1-V3 distribution on left face, asymmetric smile (droop on left), 4/5 strength of left orbicularis oculi, decreased eyebrow raise on left, hearing intact bilaterally, 4/5 strength with left shoulder shrug, tongue midline  Skin: warm, no rashes noted  Psych: alert and oriented, normal mood and affect.    Lab Results  Recent Labs  08/25/13 1850 08/27/13 0645  WBC 3.2* 7.3  HGB 10.3* 10.6*  HCT 30.4* 32.7*  NA 140 139  K 3.8 3.9  CL 104 106  CO2 25 22  BUN 11 10  CREATININE 0.77 0.58    Microbiology: 9/11 csf cx NGTD 9/11 blood cx NGTD 9/11 hsv pcr negative Studies/Results: Ct Head Wo Contrast  08/25/2013   *RADIOLOGY REPORT*  Clinical Data: Headache  CT HEAD WITHOUT CONTRAST  Technique:  Contiguous axial images were obtained from the base of the skull through the vertex without contrast.  Comparison: 07/07/2013  Findings: The brain has a normal appearance without evidence for hemorrhage, infarction, hydrocephalus, or mass lesion.  There is no extra axial fluid collection.  The skull  and paranasal sinuses are normal.  IMPRESSION: Normal exam.   Original Report Authenticated By: Signa Kell, M.D.   Mr Laqueta Jean Wo Contrast  08/26/2013   CLINICAL DATA:  Left facial numbness and tingling. History of HIV infection and meningitis.  EXAM: MRI HEAD WITHOUT AND WITH CONTRAST  TECHNIQUE: Multiplanar, multiecho pulse sequences of the brain and surrounding structures were obtained according to standard protocol without and with intravenous contrast  CONTRAST:  15mL MULTIHANCE GADOBENATE DIMEGLUMINE 529 MG/ML IV SOLN  COMPARISON:  CT 08/25/2013 and 07/07/2013  FINDINGS: The brain has normal appearance on all pulse sequences without evidence of atrophy, old or acute infarction,  mass lesion, hemorrhage, hydrocephalus or extra-axial collection. No pituitary mass. No inflammatory sinus disease. No skull or skullbase lesion. No abnormal enhancement of the brain or meninges.  IMPRESSION: Normal examination.   Electronically Signed   By: Paulina Fusi M.D.   On: 08/26/2013 16:07     Assessment/Plan: 22yo F with HIV disease presents with recurrent lymphocytic predominant meningitis, basilar meningitis with new features of multiple cranial nerve palsies, including left sided facial weakness, numbness, left arm weakness. This could represent reactivation of Varicella Zoster Virus, since HSV is negative. Also in the differential includes tuberculosis meningitis, lymphomatous meningitis 2/2 EBV, autoimmune processes such as lupus, and possibly HIV disease itself.  - please continue acyclovir for now. For discharge, we can be switched to valtrex 1gm TID for a total of 7 days, pending results of VZV. - there was limited CSF and unable to do full battery of testing. Since she had previous episode of aseptic meningitis in the Summer, this 2nd episode could be concerning for indolent infection such as extrapulmonary mTB, or fungal meningitis. - recommend that she has repeat large volume lumbar puncture so that CSF can be sent for VZV IgG (which is more sensitive than PCR), mTB pcr, VDRL, B.burgdorfi, EBV viral load, csf for AFB culture and csf for fungal culture.   - Will need to call micro lab at [facility] to determine quantity of CSF needed for all the tests  - can discontinue ampicillin, cefepime, and vancomycin  - continue with HIV medications, truvada/darunavir/ritonavir  - we will check to see what labs were sent to determine if repeat LP for CSF testing will need to be sent out. - checking lyme serology.  - will discuss with neuroradiology to review MRI results to ensure they are not seeing any abnormalities, near nerve rootlets and discuss need for further imaging  [facility],  [facility] for Infectious Diseases Cell: [phone] Pager: [phone]  08/27/2013, 11:26 AM

## 2013-08-28 MED ORDER — VALACYCLOVIR HCL 500 MG PO TABS
1000.0000 mg | ORAL_TABLET | Freq: Three times a day (TID) | ORAL | Status: DC
  Administered 2013-08-28 – 2013-08-29 (×4): 1000 mg via ORAL
  Filled 2013-08-28 (×5): qty 2

## 2013-08-28 NOTE — Progress Notes (Signed)
Npo after midnight.  Ambulated in hallway.

## 2013-08-28 NOTE — Procedures (Signed)
Procedure: Lumbar Puncture  Indication - Obtain more CSF for testing, as previous LP without enough CSF withdrawal to complete testing  Anesthesia - local 1% lidocaine w/ epi  Informed consent was obtained from the patient.   The area was prepped and draped in the usual sterile fashion. Using landmarks, a 22 guage spinal needle was inserted in the L4-L5 innerspace. The stylet was removed. The LP was attempted multiple times but we were unable to obtain CSF. The patient tolerated the procedure well. There was no blood loss or hematoma.   Dr. Madalyn Rob was present throughout the entire procedure.

## 2013-08-28 NOTE — Progress Notes (Addendum)
[facility]    Date of Admission:  08/25/2013   Total days of antibiotics 4        Day 4 acyclovir        (Day 3 ampicillin, cefepime, vanco - d/c'd 9/13)    ID: Kelsey Rodriguez is a 22 y.o. 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 She is found to have lymphocytic predominance pleocytosis on CSF analysis, gram stain negative. HSV pcr negative. Principal Problem:   Acute lymphocytic meningitis Active Problems:   Anemia of chronic disease   HIV (human immunodeficiency virus infection)   Bell's palsy    Subjective: afebrile  Medications:  . acyclovir  600 mg Intravenous Once  . acyclovir  600 mg Intravenous Q8H  . Darunavir Ethanolate  800 mg Oral Q breakfast  . emtricitabine-tenofovir  1 tablet Oral Daily  . enoxaparin (LOVENOX) injection  40 mg Subcutaneous Q24H  . feeding supplement  237 mL Oral BID BM  . ritonavir  100 mg Oral Q breakfast  . sodium chloride  3 mL Intravenous Q12H    Objective: Vital signs in last 24 hours: Temp:  [97.3 F (36.3 C)-98.1 F (36.7 C)] 97.3 F (36.3 C) (09/14 0528) Pulse Rate:  [68-89] 89 (09/14 0528) Resp:  [18-19] 18 (09/14 0528) BP: (104-118)/(70-74) 104/70 mmHg (09/14 0528) SpO2:  [100 %] 100 % (09/14 0528) Weight:  [133 lb 3.2 oz (60.419 kg)] 133 lb 3.2 oz (60.419 kg) (09/14 0528) General: Sitting up in bed, NAD  HEENT: PERRL, EOMI, no scleral icterus  Cardiac: RRR, no rubs, murmurs or gallops  Pulm: clear to auscultation bilaterally, no wheezes, rales, or rhonchi  Abd: soft, nontender, nondistended, BS present  Neuro: PERRLA, EOMI, decreased sensation to light touch through V1-V3 distribution on left face, asymmetric smile (droop on left), 4/5 strength of left orbicularis oculi, decreased eyebrow raise on left, hearing intact bilaterally,motor: grip  mildly decreased on left hand Skin: warm, no rashes noted  Psych: alert and oriented, normal mood and affect.    Lab Results  Recent Labs  08/25/13 1850 08/27/13 0645  WBC 3.2* 7.3  HGB 10.3* 10.6*  HCT 30.4* 32.7*  NA 140 139  K 3.8 3.9  CL 104 106  CO2 25 22  BUN 11 10  CREATININE 0.77 0.58    Microbiology: 9/11 csf cx NGTD 9/11 blood cx NGTD 9/11 hsv pcr negative Studies/Results: Mr Laqueta Jean Wo Contrast  08/26/2013   CLINICAL DATA:  Left facial numbness and tingling. History of HIV infection and meningitis.  EXAM: MRI HEAD WITHOUT AND WITH CONTRAST  TECHNIQUE: Multiplanar, multiecho pulse sequences of the brain and surrounding structures were obtained according to standard protocol without and with intravenous contrast  CONTRAST:  15mL MULTIHANCE GADOBENATE DIMEGLUMINE 529 MG/ML IV SOLN  COMPARISON:  CT 08/25/2013 and 07/07/2013  FINDINGS: The brain has normal appearance on all pulse sequences without evidence of atrophy, old or acute infarction, mass lesion, hemorrhage, hydrocephalus or extra-axial collection. No pituitary mass. No inflammatory sinus disease. No skull or skullbase lesion. No abnormal enhancement of the brain or meninges.  IMPRESSION: Normal examination.   Electronically Signed   By: Paulina Fusi M.D.   On: 08/26/2013 16:07     Assessment/Plan: 22yo F with HIV disease presents with recurrent lymphocytic predominant meningitis,( 1 wk of intermittent fever, HA/nuchal  rigidity that improved but had new onset of multiple cranial nerve palsies(CN5,7,9,11), including left sided facial weakness, numbness, ? Tongue involvement, left arm weakness. This could represent reactivation of Varicella Zoster Virus, since HSV is negative. Also in the differential includes tuberculosis meningitis, lymphomatous meningitis 2/2 EBV, autoimmune processes such as lupus, and possibly HIV disease itself. Initial LP did not have sufficient CSF to send for all tests. Given that she had an  episode of aseptic meningitis in July 2014, concern infectious process is part of this presentation. Spoke with neuroradiology and MRI shows no abnormal enhancement of brain or meninges  - please repeat LP to collect 10-55mL CSF to send for the following test:  - mTB pcr(63mL) - lyme (b.burgdorferi) PCR (1mL) - VDRL (1mL) - EBV PCR (1mL) - AFB/mycobacterial cultures of CSF( 1mL) - fungal culture of CSF (1mL) - arborvirus panel to the [facility] (1mL) - Varicella Zoster virus IgG (CSF) (1mL)  (spoke with lab and vzv pcr and enterovirus pcr are supposedly pending)  -  Switched acyclovir IV to valtrex 1gm TID for a total of 7 days, pending results of VZV.  HIV = continue with HIV medications, truvada/darunavir/ritonavir  - for discharge, please have her be seen in ID clinic [phone] in 7 days with Dr. Rogue Bussing, [facility] for Infectious Diseases Cell: [phone] Pager: [phone]  08/28/2013, 11:17 AM

## 2013-08-28 NOTE — Progress Notes (Addendum)
Subjective: Pt states that she continues to feel better today. She states that her headache and neck ache have improved from admission. She denies headache today but does endorse mild neck pain. She feels that her LUE weakness is improved, but she is still with left facial weakness and decreased sensation. She states that she still can still not taste or feel texture in her left mouth.   Objective: Vital signs in last 24 hours: Filed Vitals:   08/27/13 0522 08/27/13 1424 08/27/13 1957 08/28/13 0528  BP: 97/54 105/73 118/74 104/70  Pulse: 51 76 68 89  Temp: 97.4 F (36.3 C) 98.1 F (36.7 C) 98.1 F (36.7 C) 97.3 F (36.3 C)  TempSrc: Oral Oral Oral Oral  Resp: 18 19 18 18   Height:      Weight: 131 lb 4.8 oz (59.557 kg)   133 lb 3.2 oz (60.419 kg)  SpO2: 100% 100% 100% 100%   Weight change: 1 lb 14.4 oz (0.862 kg)  Intake/Output Summary (Last 24 hours) at 08/28/13 1127 Last data filed at 08/28/13 0700  Gross per 24 hour  Intake   1906 ml  Output    800 ml  Net   1106 ml   Vitals reviewed. General: Sitting up in bed, NAD HEENT: PERRL, EOMI, no scleral icterus Cardiac: RRR, no rubs, murmurs or gallops Pulm: Clear to auscultation bilaterally, no wheezes, rales, or rhonchi Abd: Soft, nontender, nondistended, BS present Ext: Warm and well perfused, no pedal edema. 4/5 strength with left shoulder shrug, normal grip strength bilaterally  Neuro: Alert and oriented X3, decreased sensation to light touch through V1-V3 distribution on left face, asymmetric smile (droop on left), 4/5 strength of left orbicularis oculi, decreased eyebrow raise on left, hearing intact bilaterally, 4/5 strength with left shoulder shrug, tongue midline    Lab Results: Basic Metabolic Panel:  Recent Labs Lab 08/25/13 1850 08/27/13 0645  NA 140 139  K 3.8 3.9  CL 104 106  CO2 25 22  GLUCOSE 81 114*  BUN 11 10  CREATININE 0.77 0.58  CALCIUM 9.6 9.7   Liver Function Tests: No results found for  this basename: AST, ALT, ALKPHOS, BILITOT, PROT, ALBUMIN,  in the last 168 hours No results found for this basename: LIPASE, AMYLASE,  in the last 168 hours No results found for this basename: AMMONIA,  in the last 168 hours  CBC:  Recent Labs Lab 08/25/13 1850 08/27/13 0645  WBC 3.2* 7.3  NEUTROABS 1.0* 6.1  HGB 10.3* 10.6*  HCT 30.4* 32.7*  MCV 85.9 87.0  PLT 207 215   Cardiac Enzymes: No results found for this basename: CKTOTAL, CKMB, CKMBINDEX, TROPONINI,  in the last 168 hours BNP: No results found for this basename: PROBNP,  in the last 168 hours D-Dimer: No results found for this basename: DDIMER,  in the last 168 hours CBG: No results found for this basename: GLUCAP,  in the last 168 hours Hemoglobin A1C: No results found for this basename: HGBA1C,  in the last 168 hours Fasting Lipid Panel: No results found for this basename: CHOL, HDL, LDLCALC, TRIG, CHOLHDL, LDLDIRECT,  in the last 168 hours Thyroid Function Tests: No results found for this basename: TSH, T4TOTAL, FREET4, T3FREE, THYROIDAB,  in the last 168 hours Coagulation: No results found for this basename: LABPROT, INR,  in the last 168 hours Anemia Panel: No results found for this basename: VITAMINB12, FOLATE, FERRITIN, TIBC, IRON, RETICCTPCT,  in the last 168 hours Urinalysis: No results found for  this basename: COLORURINE, APPERANCEUR, LABSPEC, PHURINE, GLUCOSEU, HGBUR, BILIRUBINUR, KETONESUR, PROTEINUR, UROBILINOGEN, NITRITE, LEUKOCYTESUR,  in the last 168 hours  Misc. Labs:   Micro Results: Recent Results (from the past 240 hour(s))  CULTURE, BLOOD (ROUTINE X 2)     Status: None   Collection Time    08/25/13  8:15 PM      Result Value Range Status   Specimen Description BLOOD ARM RIGHT   Final   Special Requests BOTTLES DRAWN AEROBIC AND ANAEROBIC 5CC   Final   Culture  Setup Time     Final   Value: 08/26/2013 01:57     Performed at [facility]   Culture     Final   Value:         BLOOD CULTURE RECEIVED NO GROWTH TO DATE CULTURE WILL BE HELD FOR 5 DAYS BEFORE ISSUING A FINAL NEGATIVE REPORT     Performed at [facility]   Report Status PENDING   Incomplete  CULTURE, BLOOD (ROUTINE X 2)     Status: None   Collection Time    08/25/13  8:20 PM      Result Value Range Status   Specimen Description BLOOD ARM LEFT   Final   Special Requests BOTTLES DRAWN AEROBIC ONLY 3CC   Final   Culture  Setup Time     Final   Value: 08/26/2013 01:57     Performed at [facility]   Culture     Final   Value:        BLOOD CULTURE RECEIVED NO GROWTH TO DATE CULTURE WILL BE HELD FOR 5 DAYS BEFORE ISSUING A FINAL NEGATIVE REPORT     Performed at [facility]   Report Status PENDING   Incomplete  CSF CULTURE     Status: None   Collection Time    08/25/13 10:42 PM      Result Value Range Status   Specimen Description CSF   Final   Special Requests NO 2 1CC   Final   Gram Stain     Final   Value: CYTOSPIN SLIDE WBC PRESENT, PREDOMINANTLY MONONUCLEAR     NO ORGANISMS SEEN     Performed at [facility]   Culture     Final   Value: NO GROWTH 1 DAY     Performed at [facility]   Report Status PENDING   Incomplete  GRAM STAIN     Status: None   Collection Time    08/25/13 10:42 PM      Result Value Range Status   Specimen Description CSF   Final   Special Requests NO 2 1CC   Final   Gram Stain     Final   Value: CYTOSPUN     WBC PRESENT, PREDOMINANTLY MONONUCLEAR     NO ORGANISMS SEEN   Report Status 08/26/2013 FINAL   Final   Studies/Results: Mr Lodema Pilot Contrast  08/26/2013   CLINICAL DATA:  Left facial numbness and tingling. History of HIV infection and meningitis.  EXAM: MRI HEAD WITHOUT AND WITH CONTRAST  TECHNIQUE: Multiplanar, multiecho pulse sequences of the brain and surrounding structures were obtained according to standard protocol without and with intravenous contrast  CONTRAST:  15mL MULTIHANCE GADOBENATE DIMEGLUMINE 529 MG/ML  IV SOLN  COMPARISON:  CT 08/25/2013 and 07/07/2013  FINDINGS: The brain has normal appearance on all pulse sequences without evidence of atrophy, old or acute infarction, mass lesion, hemorrhage, hydrocephalus or extra-axial  collection. No pituitary mass. No inflammatory sinus disease. No skull or skullbase lesion. No abnormal enhancement of the brain or meninges.  IMPRESSION: Normal examination.   Electronically Signed   By: Paulina Fusi M.D.   On: 08/26/2013 16:07   Medications: I have reviewed the patient's current medications. Scheduled Meds: . acyclovir  600 mg Intravenous Once  . acyclovir  600 mg Intravenous Q8H  . Darunavir Ethanolate  800 mg Oral Q breakfast  . emtricitabine-tenofovir  1 tablet Oral Daily  . enoxaparin (LOVENOX) injection  40 mg Subcutaneous Q24H  . feeding supplement  237 mL Oral BID BM  . ritonavir  100 mg Oral Q breakfast  . sodium chloride  3 mL Intravenous Q12H   Continuous Infusions:  PRN Meds:.sodium chloride, ibuprofen, morphine injection, sodium chloride  Assessment/Plan: 22 yo female with PMH of HIV, TB, and recent aseptic meningitis (discharge on 7/24), who presents with the chief complaint of left facial weakness and numbness in the setting of constant headache and neck pain since her last admission.   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 CSF on admission showed prot 64, gluc 50, RBC 1, WBC 51--98% lymphs, gram stain neg consistent with lymphocytic meningitis. This is improved from her last admission in July at which time her CSF showed prot 147, gluc 38, WBC 358 with negative HSV, crypto, TB, and enterovirus. The findings of left facial weakness (non-forehead sparing) and numbness are concerning for lesions involving CN V and VII which would localize to the left brainstem. CT on admission was negative for an intracranial process however this is not  the best imaging modality for identifying lesions or ischemia. ID was consulted at the time of admission and while infectious etiologies are less likely, broad spectrum treatment was started given her immunocompromised status. MRI brain was normal. Per ID, stopped abx, but to continue antiviral. Will perform lumbar puncture today to collect more CSF for further testing, as we did not have enough fluid from the previous LP.  - LP today - Stop acyclovir and change to Valtrex 1g po TID per ID  - ID consulted, appreciated recs  - F/u CSF cultures and cytologies   # Headache: Resolved. Pt presented with constant occipital headache since her last admission. She has been tender to palpation over her left trapezius with notable increased tone. She was also tender over the occipital region which was consistent with possible occipital neuralgia. However, today her symptoms have greatly improved and she states that her headache has resolved and her neck pain is mild today.  - Ibuprofen 600mg  prn pain  #HIV: Diagnosed in April 2013. Last CD4 count was 330 on 8/7. Currently on HAART therapy and is followed by Dr. Drue Second with ID.  - continue home Prezista, Truvada, Norvir   #DVT PPx: Lovenox   Dispo: Disposition is deferred at this time, awaiting improvement of current medical problems.  Anticipated discharge in approximately 1-3 day(s).   The patient does not have a current PCP (No primary provider on file.) and might possibly need an [facility] hospital follow-up appointment after discharge.  The patient does not have transportation limitations that hinder transportation to clinic appointments.  .Services Needed at time of discharge: Y = Yes, Blank = No PT:   OT:   RN:   Equipment:   Other:     LOS: 3 days   Genelle Gather, MD 08/28/2013, 11:27 AM

## 2013-08-29 ENCOUNTER — Inpatient Hospital Stay (HOSPITAL_COMMUNITY): Payer: Medicaid Other

## 2013-08-29 LAB — CSF CULTURE: Culture: NO GROWTH

## 2013-08-29 LAB — CSF CULTURE W GRAM STAIN

## 2013-08-29 LAB — B. BURGDORFI ANTIBODIES: B burgdorferi Ab IgG+IgM: 0.4 {ISR}

## 2013-08-29 LAB — ANA: Anti Nuclear Antibody(ANA): NEGATIVE

## 2013-08-29 MED ORDER — IBUPROFEN 600 MG PO TABS: 600.0000 mg | ORAL_TABLET | Freq: Four times a day (QID) | ORAL | Status: DC | PRN

## 2013-08-29 MED ORDER — VALACYCLOVIR HCL 1 G PO TABS: 1000.0000 mg | ORAL_TABLET | Freq: Three times a day (TID) | ORAL | Status: DC

## 2013-08-29 MED ORDER — ONDANSETRON HCL 4 MG PO TABS: 4.0000 mg | ORAL_TABLET | Freq: Three times a day (TID) | ORAL | Status: DC | PRN

## 2013-08-29 NOTE — Procedures (Signed)
Lumbar puncture performed at L4/5.  Details in radiology report.

## 2013-08-29 NOTE — Procedures (Signed)
Patient could not sit still for the procedure, at times reaching for her phone, and thus the procedure was aborted.  Radiology to attempt LP. No immediate complications.  Jonah Blue, DO, FACP [facility] 08/29/2013, 11:08 AM

## 2013-08-29 NOTE — Discharge Summary (Signed)
Name: Kelsey Rodriguez MRN: [id number] DOB: December 09, 1991 22 y.o. PCP: No primary provider on file.  Date of Admission: 08/25/2013  6:01 PM Date of Discharge: 08/29/2013 Attending Physician: No att. providers found  Discharge Diagnosis: Principal Problem:   Acute lymphocytic meningitis Active Problems:   Anemia of chronic disease   HIV (human immunodeficiency virus infection)   Bell's palsy  Discharge Medications:   Medication List    STOP taking these medications       fluconazole 150 MG tablet  Commonly known as:  DIFLUCAN     HYDROcodone-acetaminophen 5-325 MG per tablet  Commonly known as:  NORCO/VICODIN     naproxen 375 MG tablet  Commonly known as:  NAPROSYN      TAKE these medications       dronabinol 2.5 MG capsule  Commonly known as:  MARINOL  Take 1 capsule (2.5 mg total) by mouth 2 (two) times daily before a meal.     emtricitabine-tenofovir 200-300 MG per tablet  Commonly known as:  TRUVADA  Take 1 tablet by mouth daily.     ferrous sulfate 325 (65 FE) MG tablet  Take 325 mg by mouth 2 (two) times daily.     ibuprofen 600 MG tablet  Commonly known as:  ADVIL,MOTRIN  Take 1 tablet (600 mg total) by mouth every 6 (six) hours as needed.     multivitamin with minerals Tabs tablet  Take 1 tablet by mouth daily.     PREZISTA 800 MG tablet  Generic drug:  Darunavir Ethanolate  Take 800 mg by mouth daily.     ritonavir 100 MG capsule  Commonly known as:  NORVIR  Take 100 mg by mouth daily.     valACYclovir 1000 MG tablet  Commonly known as:  VALTREX  Take 1 tablet (1,000 mg total) by mouth 3 (three) times daily.        Disposition and follow-up:   [name] was discharged from [facility] in Good condition.  At the hospital follow up visit please address:  1.  Her headaches, focal neurologic deficits, completion of Valtrex.   2.  Labs / imaging needed at time of follow-up: none  3.  Pending labs/ test needing follow-up: CSF  labs -- mTB pcr, lyme, VDRL, EBV pcr, AFB/mycobacterial cultures, arbovirus panel, VZV IgG   Follow-up Appointments: Follow-up Information   Follow up with [name], MD On 09/06/2013. (At 2:30 pm)    Specialty:  Infectious Diseases   Contact information:   [address] [place] [address] [phone]       Follow up with Tyrone Schimke, MD On 08/30/2013. (Ophthalmology Appt at 2:00 pm)    Specialty:  Ophthalmology   Contact information:   [phone] [address] [facility] [place] [address] [phone]       Discharge Instructions:  Appt with Dr. Drue Second on 9/16.    Discharge Orders   Future Appointments Provider Department Dept Phone   09/06/2013 2:30 PM [name], MD [facility] for Infectious Disease [phone]   09/07/2013 2:45 PM [name], MD [facility] for Infectious Disease [phone]   09/21/2013 3:45 PM [name], MD [facility] for Infectious Disease [phone]   Future Orders Complete By Expires   Call MD for:  difficulty breathing, headache or visual disturbances  As directed    Call MD for:  extreme fatigue  As directed    Call  MD for:  persistant dizziness or light-headedness  As directed    Call MD for:  persistant nausea and vomiting  As directed    Call MD for:  severe uncontrolled pain  As directed    Call MD for:  temperature >100.4  As directed    Diet general  As directed    Increase activity slowly  As directed       Consultations:   Infectious Disease  Procedures Performed:  Ct Head Wo Contrast  08/25/2013   *RADIOLOGY REPORT*  Clinical Data: Headache  CT HEAD WITHOUT CONTRAST  Technique:  Contiguous axial images were obtained from the base of the skull through the vertex without contrast.  Comparison: 07/07/2013  Findings: The brain has a normal appearance without evidence for hemorrhage, infarction, hydrocephalus, or mass lesion.  There  is no extra axial fluid collection.  The skull and paranasal sinuses are normal.  IMPRESSION: Normal exam.   Original Report Authenticated By: Signa Kell, M.D.   Mr Kelsey Rodriguez Wo Contrast  08/26/2013   CLINICAL DATA:  Left facial numbness and tingling. History of HIV infection and meningitis.  EXAM: MRI HEAD WITHOUT AND WITH CONTRAST  TECHNIQUE: Multiplanar, multiecho pulse sequences of the brain and surrounding structures were obtained according to standard protocol without and with intravenous contrast  CONTRAST:  15mL MULTIHANCE GADOBENATE DIMEGLUMINE 529 MG/ML IV SOLN  COMPARISON:  CT 08/25/2013 and 07/07/2013  FINDINGS: The brain has normal appearance on all pulse sequences without evidence of atrophy, old or acute infarction, mass lesion, hemorrhage, hydrocephalus or extra-axial collection. No pituitary mass. No inflammatory sinus disease. No skull or skullbase lesion. No abnormal enhancement of the brain or meninges.  IMPRESSION: Normal examination.   Electronically Signed   By: Paulina Fusi M.D.   On: 08/26/2013 16:07    Admission HPI: Kelsey Rodriguez is a 22 y.o. female w/ a pmhx of HIV, TB, and recent aseptic meningitis(discharge on 7/24) who comes to the hospital w/ a cc of left face weakness and numbness. The patient had a constant headache and neck pain since her last admission that had not increased or changed in the last week. Over the last 24 hours the patient reports an acute weakness of the muscles on the left side of her face. She also reports not tasting normally on the left side of her tongue and that she is having trouble chewing. She reports a fever of 101 1 week ago. She admits to intermittent chills. No night sweats. She admits to one episode of dizziness while walking. She also admit to an episode of weakness during this time.   The patients headache is at baseline from the last month. It starts on the back of the head on the left side and radiates to the front of the head. The pain is  associated with a tight feeling on her neck that radiates to her left shoulder.  Hospital Course by problem list:  1. Acute lymphocytic meningitis c/b focal neurologic findings: Pt presented with constant headache, neck pain, and left sided facial weakness and numbness in the setting of recent discharge (July 24th) from the hospital for acute lymphocytic meningitis. LP on admission showed prot 64, gluc 50, RBC 1, WBC 51-98% lymph, gram stain negative consistent with lymphocytic meningitis. This has improved since her last admission in July at which time her CSF showed prot 147, gluc 38, WBC 358 with negative HSV/TB/enterovirus PCR and crypto Ag. She was started on empiric IV ampicillin, vanc, cefepime, and  acyclovir in the ED and ID was consulted given the patient's immunocompromised status and recent admission for meningitis. Their differential includes reactivation of Varicella Zoster Virus as HSV was negative, TB meningitis, lymphomatous meningitis 2/2 EBV, autoimmune processes such as lupus, and possibly HIV itself. As such, the antibiotics were stopped and a repeat large volume lumbar puncture was performed by interventional radiology so that CSF could be sent for VZV IgG (which is more sensitive than PCR), mTB pcr, VDRL, B.burgdorfi, EBV viral load, AFB culture, and fungal culture. VZV IgG is positive, but she still has many other results pending. Prior to discharge, she was transitioned from IV acyclovir to Valtrex po x 7 days. CSF testing were pending at time of discharge and will require follow-up.   2. Focal neurologic deficits: Pt presented with left sided facial weakness and numbness, decreased taste, and mild weakness of the left upper extremity that started 24 hours prior to admission. She received decadron x 1 in the ED and prednisone 60mg  x 1 while on the floor for supposed Bell's palsy however this was stopped due to concern for a different underlying etiology given other cranial nerve  involvement. The findings of left facial weakness (non-forehead sparing), facial numbness on exam, and weak less shoulder shrug are concerning for lesions involving CNV, VII, and XI which would localize to the left brainstem, however this does not explain the weakness in the left upper extremity and CT/MRI findings were normal ruling out an intracranial process. It is possible that her multiple neuropathies are due to her persistent meningitis and will continue to improve with time. Prior to discharge, she already noticed improvement in her taste and sensation to light touch but continued to have persistent left sided facial weakness and left upper extremity weakness. She did have new onset right eye blurriness that will require follow-up with ophthalmology, which has been set up for the patient  3. Headache: Resolved. Pt presented with constant occipital headache since her last admission with tenderness to palpation over the left trapezius and notable increased tone. This could represent tension headache from increased muscle tone versus occipital neuralgia as her pain was reproducible with palpation over the occipital region. Given her persistent lymphocytic meningitis, basilar meningeal irritation or previous LPs could also be contributing. Her pain was managed with ibuprofen 600mg  prn and she had complete resolution of her headache prior to discharge.   4. HIV: Diagnosed in April 2013. Last CD4 count was 330, viral load 685 on 8/7. Currently on HAART therapy and is followed by Dr. Drue Second with ID. She was continued on her home Prezista, Truvada, Norvir on admission and at discharge.  Discharge Vitals:   BP 115/78  Pulse 65  Temp(Src) 97.9 F (36.6 C) (Oral)  Resp 18  Ht 5\' 5"  (1.651 m)  Wt 134 lb 14.7 oz (61.2 kg)  BMI 22.45 kg/m2  SpO2 100%  LMP 08/25/2013  Discharge Labs:  No results found for this or any previous visit (from the past 24 hour(s)).  Signed: Genelle Gather, MD 08/31/2013,  9:56 AM   Time Spent on Discharge: 35 minutes Services Ordered on Discharge: None Equipment Ordered on Discharge: None

## 2013-08-29 NOTE — Progress Notes (Signed)
1830 Discharge instructions given to pt . Verbalized understanding

## 2013-08-29 NOTE — Progress Notes (Signed)
Subjective: Pt states that she feels well today. She denies any headaches but continues to endorse mild neck pain on the left. She feels that her taste and sensation to light touch are improving slowly, but does not notice any improvement with her left facial weakness. She has been having some nausea and abdominal pain with the Valtrex but otherwise is tolerating it well.   Objective: Vital signs in last 24 hours: Filed Vitals:   08/28/13 0528 08/28/13 1415 08/28/13 2003 08/29/13 0533  BP: 104/70 119/75 100/66 109/68  Pulse: 89 54 62 55  Temp: 97.3 F (36.3 C) 98 F (36.7 C) 97.7 F (36.5 C) 98.2 F (36.8 C)  TempSrc: Oral Oral Oral Oral  Resp: 18 19 18 19   Height:      Weight: 60.419 kg (133 lb 3.2 oz)   61.2 kg (134 lb 14.7 oz)  SpO2: 100% 100% 100% 100%   Weight change: 0.781 kg (1 lb 11.5 oz)  Intake/Output Summary (Last 24 hours) at 08/29/13 0723 Last data filed at 08/28/13 1500  Gross per 24 hour  Intake    340 ml  Output      0 ml  Net    340 ml   Physical Exam Vitals reviewed.  General: Sitting up in bed, NAD  HEENT: PERRL, EOMI, no scleral icterus  Cardiac: RRR, no rubs, murmurs or gallops  Pulm: Clear to auscultation bilaterally, no wheezes, rales, or rhonchi  Abd: Soft, nontender, nondistended, BS present  Ext: Warm and well perfused, no pedal edema. 4/5 strength with left shoulder shrug, 4/5 grip strength on left Neuro: Alert and oriented X3, decreased sensation to light touch through V1-V3 distribution on left face, asymmetric smile (droop on left), 4/5 strength of left orbicularis oculi, decreased eyebrow raise on left, hearing intact bilaterally, 4/5 strength with left shoulder shrug, uvula and tongue midline    Micro Results: Recent Results (from the past 240 hour(s))  CULTURE, BLOOD (ROUTINE X 2)     Status: None   Collection Time    08/25/13  8:15 PM      Result Value Range Status   Specimen Description BLOOD ARM RIGHT   Final   Special Requests  BOTTLES DRAWN AEROBIC AND ANAEROBIC 5CC   Final   Culture  Setup Time     Final   Value: 08/26/2013 01:57     Performed at [facility]   Culture     Final   Value:        BLOOD CULTURE RECEIVED NO GROWTH TO DATE CULTURE WILL BE HELD FOR 5 DAYS BEFORE ISSUING A FINAL NEGATIVE REPORT     Performed at [facility]   Report Status PENDING   Incomplete  CULTURE, BLOOD (ROUTINE X 2)     Status: None   Collection Time    08/25/13  8:20 PM      Result Value Range Status   Specimen Description BLOOD ARM LEFT   Final   Special Requests BOTTLES DRAWN AEROBIC ONLY 3CC   Final   Culture  Setup Time     Final   Value: 08/26/2013 01:57     Performed at [facility]   Culture     Final   Value:        BLOOD CULTURE RECEIVED NO GROWTH TO DATE CULTURE WILL BE HELD FOR 5 DAYS BEFORE ISSUING A FINAL NEGATIVE REPORT     Performed at [facility]   Report Status PENDING  Incomplete  CSF CULTURE     Status: None   Collection Time    08/25/13 10:42 PM      Result Value Range Status   Specimen Description CSF   Final   Special Requests NO 2 1CC   Final   Gram Stain     Final   Value: CYTOSPIN SLIDE WBC PRESENT, PREDOMINANTLY MONONUCLEAR     NO ORGANISMS SEEN     Performed at [facility]   Culture     Final   Value: NO GROWTH 2 DAYS     Performed at [facility]   Report Status PENDING   Incomplete  GRAM STAIN     Status: None   Collection Time    08/25/13 10:42 PM      Result Value Range Status   Specimen Description CSF   Final   Special Requests NO 2 1CC   Final   Gram Stain     Final   Value: CYTOSPUN     WBC PRESENT, PREDOMINANTLY MONONUCLEAR     NO ORGANISMS SEEN   Report Status 08/26/2013 FINAL   Final   Studies/Results: No results found. Medications: I have reviewed the patient's current medications. Scheduled Meds: . Darunavir Ethanolate  800 mg Oral Q breakfast  . emtricitabine-tenofovir  1 tablet Oral Daily  . feeding supplement   237 mL Oral BID BM  . ritonavir  100 mg Oral Q breakfast  . sodium chloride  3 mL Intravenous Q12H  . valACYclovir  1,000 mg Oral TID   Continuous Infusions:  PRN Meds:.sodium chloride, ibuprofen, morphine injection, sodium chloride  Assessment/Plan: 22 yo female with PMH of HIV, TB, and recent aseptic meningitis (discharge on 7/24), who presents with the chief complaint of left facial weakness and numbness in the setting of constant headache and neck pain since her last admission.   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 CSF on admission showed prot 64, gluc 50, RBC 1, WBC 51--98% lymphs, gram stain neg consistent with lymphocytic meningitis. This is improved from her last admission in July at which time her CSF showed prot 147, gluc 38, WBC 358 with negative HSV, crypto, TB, and enterovirus. The findings of left facial weakness (non-forehead sparing) and numbness are concerning for lesions involving CN V and VII which would localize to the left brainstem, however this does not explain her left upper extremity weakness. CT on admission was negative for an intracranial process however this is not the best imaging modality for identifying lesions or ischemia. ID was consulted at the time of admission and while infectious etiologies are less likely, broad spectrum treatment was started given her immunocompromised status. MRI brain was normal. Per ID, stopped abx, but will continue antiviral. LP from yesterday was unsuccessful despite multiple attempts so will have VIR perform lumbar puncture today to collect more CSF for further testing. - LP today with VIR - Continue Valtrex 1g po TID per ID  - ID consulted, appreciated recs  - F/u CSF cultures and cytologies   # Headache: Resolved. Pt presented with constant occipital headache since her last admission. She has been tender to palpation over  her left trapezius with notable increased tone. She was also tender over the occipital region which was consistent with possible occipital neuralgia. However, today her symptoms have greatly improved and she states that her headache has resolved and her neck pain is  mild today.  - Ibuprofen 600mg  prn pain   #HIV: Diagnosed in April 2013. Last CD4 count was 330 on 8/7. Currently on HAART therapy and is followed by Dr. Drue Second with ID.  - continue home Prezista, Truvada, Norvir   #DVT PPx: Lovenox  - hold until after LP today  Dispo: Disposition is deferred at this time, awaiting improvement of current medical problems. Anticipated discharge in approximately 1 day(s).   The patient does not have a current PCP (No primary provider on file.) and might possibly need an [facility] hospital follow-up appointment after discharge.   The patient does not have transportation limitations that hinder transportation to clinic appointments.   This is a Psychologist, occupational Note.  The care of the patient was discussed with Dr. Sherrine Maples and the assessment and plan formulated with their assistance.  Please see their attached note for official documentation of the daily encounter.   LOS: 4 days   [employer], Med Student 08/29/2013, 7:23 AM

## 2013-08-29 NOTE — Progress Notes (Signed)
1200 back from IR  Kept flat on the bed . Small band aid to lumbar area dry and intact ,. No leakage , no redness noted . No headache. Ate well for lunch. Family in attendance

## 2013-08-29 NOTE — Progress Notes (Signed)
Kelsey Rodriguez is a 22 y.o. female with HIV, cd 4 count of 330/VL 685 on 07/21/2013.  She is on truvada/DRVr and was previously treated for pulm TB in 2013.  She was treated as an inpatient for 4 days in [facility] hospital at the end of July for aseptic meningitis, presenting with intermittent fevers, HA neck pain and some weakness..  On release from hospital symptoms persisted and were unchanged.  She now presents with left facial droop and numbness, taste abnormality and right shoulder weakness and occipital HA radiating to left shoulder. She is found to have lymphocytic predominance pleocytosis on CSF analysis, gram stain negative. HSV pcr negative on admission.   Repeat CSF attempted today 9/14 but unable to draw enough fluid. Patient is feeling better and has reported tat her HA has resolved, still experiencing neck pain, decreased sensation left sided V1-V3 distribution and weakness left side CNVII.   Acyclovir IV was switched to [facility] yesterday which patient believes is causing stomacheache (biting feeling). C/o blurriness R eye.  Principal Problem:  Acute lymphocytic meningitis  Active Problems:  Anemia of chronic disease  HIV (human immunodeficiency virus infection)  Subjective "Feeling Much better".  Patients headache is gone for past 2 days, still experiencing left sided neck pain worse on palpation.  New onset of stomach pain described as "biting" and diffuse over abdomen which began yesterday after starting a new medication.  No associated symptoms (n/v change in bowels, change in urine), not associated with eating and pain is made worse on palpation of abdomen.  Objective Vital signs last 24 hours: (on telemetry)   Vital Sign Min/Max (last 24 hours)   Value Min Max   Temp 97.7 F (36.5 C) 98.2 F (36.8 C)   Pulse Rate ! 54 62   Resp 18 19   BP: Systolic 100 mmHg 119 mmHg   BP: Diastolic 66 mmHg 75 mmHg   SpO2 100 % 100 %        Intake/Output        09/13 0700 09/14 0659  09/14 0700 09/15 0659 09/15 0700 09/16 0659    P.O. 1400 340 0    I.V. (mL/kg) 120 (2)      IV Piggyback 274 112     Total Intake(mL/kg) 1794 (29.7) 452 (7.4)     Urine (mL/kg/hr) 800 (0.6)      Total Output 800      Net +994 +452 0      Lab Results   Recent Labs   08/25/13 1850  08/27/13 0645   WBC  3.2*  7.3   HGB  10.3*  10.6*   HCT  30.4*  32.7*   NA  140  139   K  3.8  3.9   CL  104  106   CO2  25  22   BUN  11  10   CREATININE  0.77  0.58    Microbiology:  9/11 csf cx NGTD  9/11 blood cx NGTD  9/11 hsv pcr negative   CSF attempted on 9/14 but unable to draw enough CSF Studies/Results:  Mr Laqueta Jean Wo Contrast  08/26/2013 CLINICAL DATA: Left facial numbness and tingling. History of HIV infection and meningitis. EXAM: MRI HEAD WITHOUT AND WITH CONTRAST TECHNIQUE: Multiplanar, multiecho pulse sequences of the brain and surrounding structures were obtained according to standard protocol without and with intravenous contrast CONTRAST: 15mL MULTIHANCE GADOBENATE DIMEGLUMINE 529 MG/ML IV SOLN COMPARISON: CT 08/25/2013 and 07/07/2013 FINDINGS: The brain has  normal appearance on all pulse sequences without evidence of atrophy, old or acute infarction, mass lesion, hemorrhage, hydrocephalus or extra-axial collection. No pituitary mass. No inflammatory sinus disease. No skull or skullbase lesion. No abnormal enhancement of the brain or meninges. IMPRESSION: Normal examination. Electronically Signed By: Paulina Fusi M.D. On: 08/26/2013 16:07    Physical Exam General: Sitting up in bed, NAD  HEENT: PERRL, EOMI, no scleral icterus  Cardiac: RRR, no rubs, murmurs or gallops  Pulm: Clear to auscultation bilaterally, no wheezes, rales, or rhonchi  Abd: Soft, nontender, nondistended, BS present, diffuse abdomen pain with deep palpation  MSK: 4/5 strength with left shoulder shrug, normal grip strength bilaterally  Neuro: Alert and oriented X3, decreased sensation to light touch through  V1-V3 distribution on left face, asymmetric smile on left, 4/5 strength of left orbicularis oculi, decreased eyebrow raise on left, hearing intact bilaterally, tongue midline   Scheduled Meds:   . Darunavir Ethanolate  800 mg Oral Q breakfast  . emtricitabine-tenofovir  1 tablet Oral Daily  . feeding supplement  237 mL Oral BID BM  . ritonavir  100 mg Oral Q breakfast  . sodium chloride  3 mL Intravenous Q12H  . valACYclovir  1,000 mg Oral TID     Assessment Aseptic Meningitis, recurrent DDX: Varicella Zoster Virus, lymphomatous meningitis 2/2 EBV, autoimmune processes such as lupus, TB   Plan Repeat CSF- VZ virus IgG, EBV PCR, AFB/mycobacterial cultures, arbovirus panal, fungal culture Continue on medication for viral meningitis (VTX- pt states biting only lasts for 1 minute). Would have OPHTHO eval her Continue HAART  Total days of antibiotics 3 ampicillin, cefepime, vanco (d/c'd 9/13)  4 days acyclovir d/c'd 9/14 1 day valtrex started 9/14  Pt seen and examined with MS4

## 2013-08-29 NOTE — Progress Notes (Signed)
  Date: 08/29/2013  Patient name: Kelsey Rodriguez  Medical record number: [id number]  Date of birth: 1991/05/08   This patient has been seen and the plan of care was discussed with the house staff. Please see their note for complete details. I concur with their findings with the following additions/corrections: She is s/p LP this morning.  She feels well. She has persistent let facial droop, taste abnormality in left tongue, left shoulder weakness, recent HA, and decreased grip strength in left hand. This isn't a straight-forward CN VII palsy, it involves multiple CN's.  Recurrent lymphocytic predominant meningitis.  Need to investigate additional etiologies per ID, including tuberculous meningitis, lymphomatous meningitis due to EBV, lupus, HIV disease, varicella zoster,etc. Studies to be sent. On valtrex 1 g TID for 7 days. May D/C today with ID follow up if ok with ID.  Jonah Blue, DO, FACP [facility] 08/29/2013, 11:42 AM

## 2013-08-29 NOTE — Progress Notes (Signed)
I have seen the patient and reviewed the daily progress note by Gilford Silvius, MS -IV and discussed the care of the patient with her.  See below for documentation of my findings, assessment, and plans.  Subjective: Stopped prednisone yesterday and changed acyclovir to po Valtrex. Attempted LP at the bedside but after multiple attempts were unsuccessful. Pt states that she continues to feel better than on admission. Still with left facial weakness, but she states that her headache and neck pain are not present today. She states that the LUE weakness has resolved today.   Objective: Vital signs in last 24 hours: Filed Vitals:   08/28/13 0528 08/28/13 1415 08/28/13 2003 08/29/13 0533  BP: 104/70 119/75 100/66 109/68  Pulse: 89 54 62 55  Temp: 97.3 F (36.3 C) 98 F (36.7 C) 97.7 F (36.5 C) 98.2 F (36.8 C)  TempSrc: Oral Oral Oral Oral  Resp: 18 19 18 19   Height:      Weight: 133 lb 3.2 oz (60.419 kg)   134 lb 14.7 oz (61.2 kg)  SpO2: 100% 100% 100% 100%   Weight change: 1 lb 11.5 oz (0.781 kg)  Intake/Output Summary (Last 24 hours) at 08/29/13 1131 Last data filed at 08/29/13 0805  Gross per 24 hour  Intake    220 ml  Output      0 ml  Net    220 ml   Vitals reviewed.  General: Sitting up in bed, NAD  HEENT: PERRL, EOMI, no scleral icterus  Cardiac: RRR, no rubs, murmurs or gallops  Pulm: Clear to auscultation bilaterally, no wheezes, rales, or rhonchi  Abd: Soft, nontender, nondistended, BS present  Ext: Warm and well perfused, no pedal edema. 4/5 strength with left shoulder shrug, normal grip strength bilaterally  Neuro: Alert and oriented X3, decreased sensation to light touch through V1-V3 distribution on left face, asymmetric smile (droop on left), 4/5 strength of left orbicularis oculi, decreased eyebrow raise on left, hearing intact bilaterally, 4/5 strength with left shoulder shrug, tongue midline    Lab Results: Reviewed and documented in Electronic  Record Micro Results: Reviewed and documented in Electronic Record Studies/Results: Reviewed and documented in Electronic Record  Medications: I have reviewed the patient's current medications. Scheduled Meds: . Darunavir Ethanolate  800 mg Oral Q breakfast  . emtricitabine-tenofovir  1 tablet Oral Daily  . feeding supplement  237 mL Oral BID BM  . ritonavir  100 mg Oral Q breakfast  . sodium chloride  3 mL Intravenous Q12H  . valACYclovir  1,000 mg Oral TID   Continuous Infusions:  PRN Meds:.sodium chloride, ibuprofen, morphine injection, ondansetron, sodium chloride  Assessment/Plan: 22 yo female with PMH of HIV, TB, and recent aseptic meningitis (discharge on 7/24), who presents with the chief complaint of left facial weakness and numbness in the setting of constant headache and neck pain since her last admission.   # Acute aseptic meningitis c/b focal neurologic findings: Presented with constant headache and neck pain with left sided weakness and numbness. CSF on admission showed prot 64, gluc 50, RBC 1, WBC 51--98% lymphs, gram stain neg consistent with lymphocytic meningitis. This is improved from her last admission in July at which time her CSF showed prot 147, gluc 38, WBC 358 with negative HSV, crypto, TB, and enterovirus. The findings of left facial weakness (non-forehead sparing) and numbness are concerning for lesions involving CN V and VII which would localize to the left brainstem, however this does not explain her left upper  extremity weakness. CT on admission was negative for an intracranial process however this is not the best imaging modality for identifying lesions or ischemia. ID was consulted at the time of admission and while infectious etiologies are less likely, broad spectrum treatment was started given her immunocompromised status. MRI brain was normal. Per ID, stopped abx, but will continue antiviral. LP from yesterday was unsuccessful despite multiple attempts so will  have Radiology perform lumbar puncture today to collect more CSF for further testing.  - LP today with Radiology - Continue Valtrex 1g po TID per ID  - ID consulted, appreciated recs  - F/u CSF cultures and cytologies   # Headache: Resolved. Pt presented with constant occipital headache since her last admission. She has been tender to palpation over her left trapezius with notable increased tone. She was also tender over the occipital region which was consistent with possible occipital neuralgia. However, today her symptoms have greatly improved and she states that her headache and neck pain have resolved today.  - Ibuprofen 600mg  prn pain   #HIV: Diagnosed in April 2013. Last CD4 count was 330 with viral load of 685 on 8/7. Currently on HAART therapy and is followed by Dr. Drue Second with ID. She endorses compliance with her medications.  - continue home Prezista, Truvada, Norvir   #DVT PPx: Lovenox. Held for LP today. Will restart after LP   Dispo: Possible d/c to home today or tomorrow.    The patient does not have a current PCP (No primary provider on file.) and does need an [facility] hospital follow-up appointment after discharge.  The patient does not have transportation limitations that hinder transportation to clinic appointments.  .Services Needed at time of discharge: Y = Yes, Blank = No PT:   OT:   RN:   Equipment:   Other:     LOS: 4 days   Genelle Gather, MD 08/29/2013, 11:31 AM

## 2013-08-30 ENCOUNTER — Ambulatory Visit (INDEPENDENT_AMBULATORY_CARE_PROVIDER_SITE_OTHER): Payer: Medicaid Other | Admitting: Internal Medicine

## 2013-08-30 ENCOUNTER — Encounter: Payer: Self-pay | Admitting: Internal Medicine

## 2013-08-30 VITALS — BP 124/79 | HR 80 | Temp 98.9°F | Ht 65.0 in | Wt 134.0 lb

## 2013-08-30 DIAGNOSIS — B029 Zoster without complications: Secondary | ICD-10-CM

## 2013-08-30 DIAGNOSIS — R51 Headache: Secondary | ICD-10-CM

## 2013-08-30 LAB — VARICELLA ZOSTER ANTIBODY, IGG: Varicella IgG: 3398 Index — ABNORMAL HIGH (ref <135.00)

## 2013-08-30 LAB — EPSTEIN BARR VRS(EBV DNA BY PCR): EBV DNA QN by PCR: <500 copies/mL (ref <500)

## 2013-08-30 MED ORDER — HYDROCODONE-ACETAMINOPHEN 5-300 MG PO TABS: 1.0000 | ORAL_TABLET | Freq: Three times a day (TID) | ORAL | Status: DC | PRN

## 2013-08-30 MED ORDER — VALACYCLOVIR HCL 1 G PO TABS: 1000.0000 mg | ORAL_TABLET | Freq: Every day | ORAL | Status: DC

## 2013-08-30 NOTE — Progress Notes (Signed)
[facility] CLINIC NOTE  RFV: hospital follow up Subjective:    Patient ID: Kelsey Rodriguez, female    DOB: [birth year], 22 y.o.   MRN: [id number]  HPI 22yo F with HIV, Cd 4 count of 330/VL 685, currently on truvada/DRVr, she was recently discharged from hospital for new onset of left facial weakness, numbness and shoulder weakness, which was preceded by fever, headache and neck pain thought to be viral meningitis. Infectious work up shows +varicella IgG in CSF although many other cultures and tests are pending. She now reports neck and back pain from lumbar tap (which had to be repeated), and unable to close her left eye and saw ophthomologist who gave her eye ointment and pain medication.   Current Outpatient Prescriptions on File Prior to Visit  Medication Sig Dispense Refill  . Darunavir Ethanolate (PREZISTA) 800 MG tablet Take 800 mg by mouth daily.      [name] dronabinol (MARINOL) 2.5 MG capsule Take 1 capsule (2.5 mg total) by mouth 2 (two) times daily before a meal.  60 capsule  2  . emtricitabine-tenofovir (TRUVADA) 200-300 MG per tablet Take 1 tablet by mouth daily.      . ferrous sulfate 325 (65 FE) MG tablet Take 325 mg by mouth 2 (two) times daily.      . Multiple Vitamin (MULTIVITAMIN WITH MINERALS) TABS Take 1 tablet by mouth daily.      . ritonavir (NORVIR) 100 MG capsule Take 100 mg by mouth daily.      . traMADol (ULTRAM) 50 MG tablet Take 50 mg by mouth every 6 (six) hours as needed for pain.      [name] ibuprofen (ADVIL,MOTRIN) 600 MG tablet Take 1 tablet (600 mg total) by mouth every 6 (six) hours as needed.  30 tablet  0  . valACYclovir (VALTREX) 1000 MG tablet Take 1 tablet (1,000 mg total) by mouth 3 (three) times daily.  8 tablet  0   No current facility-administered medications on file prior to visit.   Active Ambulatory Problems    Diagnosis Date Noted  . Dysphagia 03/11/2012  . 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 Anemia of chronic disease 03/11/2012  . Reflux  esophagitis 03/11/2012  . 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 HIV (human immunodeficiency virus infection) 03/16/2012  . Arthralgia 05/20/2012  . 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 Vaginal Discharge 05/30/2012  . Laceration of ankle, right 11/18/2012  . Chest pain 07/07/2013  . 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 Bell's palsy 08/26/2013   Resolved Ambulatory Problems    Diagnosis Date Noted  . Chest pain 03/11/2012  . Muscle strain of chest wall 05/30/2013   Past Medical History  Diagnosis Date  . Tuberculosis   . Pelvic pain    Review of Systems HA, neck pain and back pain, and fatigue, other ROS are negative    Objective:   Physical Exam BP 124/79  Pulse 80  Temp(Src) 98.9 F (37.2 C) (Oral)  Ht 5\' 5"  (1.651 m)  Wt 134 lb (60.782 kg)  BMI 22.3 kg/m2  LMP 08/20/2013 Physical Exam  Constitutional:  oriented to person, place, and time. Fatigue appearing. No distress.  HENT:  Mouth/Throat: Oropharynx is clear and moist. No oropharyngeal exudate. No nuchal rigidity Cardiovascular: Normal rate, regular rhythm and normal heart sounds. Exam reveals no gallop and no friction rub.  No murmur heard.  Pulmonary/Chest: Effort normal and breath sounds normal. No respiratory distress.  no wheezes.    Lymphadenopathy:  no cervical adenopathy.  Neurological:alert and oriented to person, place, and time. Left facial droop most noticeable when she is smiling. No difference in shoulder shrug and facial numbness improved Skin: Skin is warm and dry. No rash noted. No erythema.  Psychiatric:  a normal mood and affect. behavior is normal.       Assessment & Plan:  HIV = continue with truvada/DRVr  Varicella zoster = continue on valtrex 1gm TID, for 4 more days then decrease to 1gm daily  Headache = likely post =-LP headache, gave pain medicaiton refill  rtc in 1 month

## 2013-08-31 LAB — VDRL, CSF: VDRL Quant, CSF: NONREACTIVE

## 2013-08-31 LAB — VARICELLA-ZOSTER BY PCR: Varicella-Zoster, PCR: NOT DETECTED

## 2013-08-31 NOTE — Discharge Summary (Signed)
  Date: 08/31/2013  Patient name: Kelsey Rodriguez  Medical record number: [id number]  Date of birth: 1991-09-01   This patient has been discussed with the house staff. Please see their note for complete details. I concur with their findings with the following additions/corrections: Follow up with ID for further investigation of CSF testing.  Jonah Blue, DO, FACP [facility] 08/31/2013, 10:37 AM

## 2013-09-01 LAB — CULTURE, BLOOD (ROUTINE X 2)
Culture: NO GROWTH
Culture: NO GROWTH

## 2013-09-03 LAB — M. TUBERCULOSIS COMPLEX BY PCR: M. tuberculosis, Direct: NOT DETECTED

## 2013-09-05 LAB — MISCELLANEOUS TEST

## 2013-09-05 LAB — ENTEROVIRUS PCR: Enterovirus PCR: NOT DETECTED

## 2013-09-06 ENCOUNTER — Ambulatory Visit: Payer: Self-pay | Admitting: Internal Medicine

## 2013-09-07 ENCOUNTER — Encounter: Payer: Self-pay | Admitting: *Deleted

## 2013-09-07 ENCOUNTER — Ambulatory Visit (INDEPENDENT_AMBULATORY_CARE_PROVIDER_SITE_OTHER): Payer: MEDICAID | Admitting: Internal Medicine

## 2013-09-07 ENCOUNTER — Encounter: Payer: Self-pay | Admitting: Internal Medicine

## 2013-09-07 VITALS — BP 111/70 | HR 86 | Temp 98.7°F | Wt 131.0 lb

## 2013-09-07 DIAGNOSIS — M792 Neuralgia and neuritis, unspecified: Secondary | ICD-10-CM

## 2013-09-07 DIAGNOSIS — IMO0002 Reserved for concepts with insufficient information to code with codable children: Secondary | ICD-10-CM

## 2013-09-07 MED ORDER — PREGABALIN 150 MG PO CAPS: 150.0000 mg | ORAL_CAPSULE | Freq: Two times a day (BID) | ORAL | Status: DC

## 2013-09-07 NOTE — Progress Notes (Signed)
[facility] CLINIC NOTE  RFV: HIV follow up Subjective:    Patient ID: Kelsey Rodriguez, female    DOB: 27-Sep-1991, 22 y.o.   MRN: [id number]  HPI 22yo F with HIV, Cd 4 count of 330/VL 685, currently on truvada/DRVr, she was recently discharged from hospital for new onset of left facial weakness, numbness and shoulder weakness, which was preceded by fever, headache and neck pain due to varicella meningitis.  She now reports neck and facial pain, occasionally sharp. Unable to sleep on left side of face. No fever, chills, nightsweats. Still fatigued from her hospitalization  Current Outpatient Prescriptions on File Prior to Visit  Medication Sig Dispense Refill  . Artificial Tear Ointment (EYE LUBRICANT) OINT Apply 1 application to eye as needed (both eyes).      . Darunavir Ethanolate (PREZISTA) 800 MG tablet Take 800 mg by mouth daily.      [name] dronabinol (MARINOL) 2.5 MG capsule Take 1 capsule (2.5 mg total) by mouth 2 (two) times daily before a meal.  60 capsule  2  . emtricitabine-tenofovir (TRUVADA) 200-300 MG per tablet Take 1 tablet by mouth daily.      . ferrous sulfate 325 (65 FE) MG tablet Take 325 mg by mouth 2 (two) times daily.      . Hydrocodone-Acetaminophen 5-300 MG TABS Take 1 tablet by mouth every 8 (eight) hours as needed.  30 each  0  . ibuprofen (ADVIL,MOTRIN) 600 MG tablet Take 1 tablet (600 mg total) by mouth every 6 (six) hours as needed.  30 tablet  0  . Multiple Vitamin (MULTIVITAMIN WITH MINERALS) TABS Take 1 tablet by mouth daily.      . ritonavir (NORVIR) 100 MG capsule Take 100 mg by mouth daily.      . valACYclovir (VALTREX) 1000 MG tablet Take 1 tablet (1,000 mg total) by mouth 3 (three) times daily.  8 tablet  0  . valACYclovir (VALTREX) 1000 MG tablet Take 1 tablet (1,000 mg total) by mouth daily. To start after finishing 3 times a day course of therapy  30 tablet  11   No current facility-administered medications on file prior to visit.   Active Ambulatory Problems   Diagnosis Date Noted  . Dysphagia 03/11/2012  . 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 Anemia of chronic disease 03/11/2012  . Reflux esophagitis 03/11/2012  . 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 HIV (human immunodeficiency virus infection) 03/16/2012  . Arthralgia 05/20/2012  . 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 Vaginal Discharge 05/30/2012  . Laceration of ankle, right 11/18/2012  . Chest pain 07/07/2013  . 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 Bell's palsy 08/26/2013   Resolved Ambulatory Problems    Diagnosis Date Noted  . Chest pain 03/11/2012  . Muscle strain of chest wall 05/30/2013   Past Medical History  Diagnosis Date  . Tuberculosis   . Pelvic pain    History  Substance Use Topics  . Smoking status: Never Smoker   . Smokeless tobacco: Never Used  . Alcohol Use: No     Comment: socially  - lives with mom, cares for her young nephews. Has new partner,   Review of Systems     Objective:   Physical Exam BP 111/70  Pulse 86  Temp(Src) 98.7 F (37.1 C) (Oral)  Wt 131 lb (59.421 kg)  BMI 21.8 kg/m2  LMP 08/20/2013 Physical Exam  Constitutional: oriented to person, place, and time.  appears well-developed and well-nourished. No distress.  HENT:  Mouth/Throat:  Oropharynx is clear and moist. No oropharyngeal exudate.  Cardiovascular: Normal rate, regular rhythm and normal heart sounds. Exam reveals no gallop and no friction rub.  No murmur heard.  Pulmonary/Chest: Effort normal and breath sounds normal. No respiratory distress. He has no wheezes.  Abdominal: Soft. Bowel sounds are normal. He exhibits no distension. There is no tenderness.  Lymphadenopathy:  no cervical adenopathy.  Neurological:  alert and oriented to person, place, and time. Still has left sided facial weakness Skin: Skin is warm and dry. No rash noted. No erythema.  Psychiatric:  a normal mood and affect.  behavior is normal.       Assessment & Plan:   HIV = continue with  truvada/DRVr. 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 = will do a trial of lyrica to see if it improves her symptoms. Will start with lower doses. Still taking time to recover from her illness. Will give work letter to RTW in early November  Meningitis with cranial nerve radiculopathy = work up also showed low level of EBV in CSF. We will follow up on AFB cultures in next 4 wks to see if repeat mri is warranted vs. Repeat csf analysis.   hiv prevention = provided condoms  Health maintenance = received flu vaccine while she was hospitalized  rtc in 2 wks to see if improvement with her symptoms.

## 2013-09-09 LAB — B. BURGDORFI ANTIBODIES, CSF

## 2013-09-16 LAB — ARBOVIRUS PANEL, ~~LOC~~ LAB

## 2013-09-21 ENCOUNTER — Other Ambulatory Visit: Payer: Self-pay | Admitting: *Deleted

## 2013-09-21 ENCOUNTER — Ambulatory Visit: Payer: Medicaid Other | Admitting: Internal Medicine

## 2013-09-21 DIAGNOSIS — B20 Human immunodeficiency virus [HIV] disease: Secondary | ICD-10-CM

## 2013-09-21 MED ORDER — EMTRICITABINE-TENOFOVIR DF 200-300 MG PO TABS: 1.0000 | ORAL_TABLET | Freq: Every day | ORAL | Status: DC

## 2013-09-21 MED ORDER — RITONAVIR 100 MG PO CAPS: 100.0000 mg | ORAL_CAPSULE | Freq: Every day | ORAL | Status: DC

## 2013-09-21 MED ORDER — DARUNAVIR ETHANOLATE 800 MG PO TABS: 800.0000 mg | ORAL_TABLET | Freq: Every day | ORAL | Status: DC

## 2013-09-26 ENCOUNTER — Ambulatory Visit (INDEPENDENT_AMBULATORY_CARE_PROVIDER_SITE_OTHER): Payer: MEDICAID | Admitting: Internal Medicine

## 2013-09-26 ENCOUNTER — Encounter: Payer: Self-pay | Admitting: Internal Medicine

## 2013-09-26 VITALS — BP 121/74 | HR 89 | Temp 98.5°F | Wt 134.0 lb

## 2013-09-26 DIAGNOSIS — B20 Human immunodeficiency virus [HIV] disease: Secondary | ICD-10-CM

## 2013-09-26 DIAGNOSIS — Z21 Asymptomatic human immunodeficiency virus [HIV] infection status: Secondary | ICD-10-CM

## 2013-09-26 NOTE — Progress Notes (Signed)
[facility] CLIN[facility] NOTE  RFV: routine follow up for recent hospitalization Subjective:    Patient ID: Kelsey Rodriguez, female    DOB: 1991/01/27, 22 y.o.   MRN: [id number]  HPI 22yo F with HIV, Cd 4 count of 330/VL 685, currently on truvada/DRVr, she was recently discharged from hospital for new onset of left facial weakness, numbness and shoulder weakness, which was preceded by fever, headache and neck pain due to varicella meningitis. Work-up did also show low level EBV in CSF of unknown significance. mTB cultures are still negative. She  Reports improvement from neck and facial pain, occasionally sharp. Still not sleeping as much on left side of face. No fever, chills, nightsweats. Improved fatigue  Current Outpatient Prescriptions on File Prior to Visit  Medication Sig Dispense Refill  . Darunavir Ethanolate (PREZISTA) 800 MG tablet Take 1 tablet (800 mg total) by mouth daily.  30 tablet  11  . dronabinol (MARINOL) 2.5 MG capsule Take 1 capsule (2.5 mg total) by mouth 2 (two) times daily before a meal.  60 capsule  2  . emtricitabine-tenofovir (TRUVADA) 200-300 MG per tablet Take 1 tablet by mouth daily.  30 tablet  11  . ferrous sulfate 325 (65 FE) MG tablet Take 325 mg by mouth 2 (two) times daily.      . Multiple Vitamin (MULTIVITAMIN WITH MINERALS) TABS Take 1 tablet by mouth daily.      . pregabalin (LYRICA) 150 MG capsule Take 1 capsule (150 mg total) by mouth 2 (two) times daily.  60 capsule  3  . ritonavir (NORVIR) 100 MG capsule Take 1 capsule (100 mg total) by mouth daily.  30 capsule  11  . valACYclovir (VALTREX) 1000 MG tablet Take 1 tablet (1,000 mg total) by mouth 3 (three) times daily.  8 tablet  0  . valACYclovir (VALTREX) 1000 MG tablet Take 1 tablet (1,000 mg total) by mouth daily. To start after finishing 3 times a day course of therapy  30 tablet  11  . Artificial Tear Ointment (EYE LUBRICANT) OINT Apply 1 application to eye as needed (both eyes).       No current  facility-administered medications on file prior to visit.   Active Ambulatory Problems    Diagnosis Date Noted  . Dysphagia 03/11/2012  . 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 Anemia of chronic disease 03/11/2012  . Reflux esophagitis 03/11/2012  . 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 HIV (human immunodeficiency virus infection) 03/16/2012  . Arthralgia 05/20/2012  . 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 Vaginal Discharge 05/30/2012  . Laceration of ankle, right 11/18/2012  . Chest pain 07/07/2013  . 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 Bell's palsy 08/26/2013   Resolved Ambulatory Problems    Diagnosis Date Noted  . Chest pain 03/11/2012  . Muscle strain of chest wall 05/30/2013   Past Medical History  Diagnosis Date  . Tuberculosis   . Pelvic pain       Review of Systems haivng heavy menses; 12 point ROS is otherwise negative    Objective:   Physical Exam BP 121/74  Pulse 89  Temp(Src) 98.5 F (36.9 C) (Oral)  Wt 134 lb (60.782 kg)  BMI 22.3 kg/m2  LMP 09/25/2013 Physical Exam  Constitutional:  oriented to person, place, and time. appears well-developed and well-nourished. No distress.  HENT:  Mouth/Throat: Oropharynx is clear and moist. No oropharyngeal exudate.  Cardiovascular: Normal rate, regular rhythm and normal heart sounds. Exam reveals no gallop and no friction rub.  No murmur heard.  Pulmonary/Chest: Effort normal and breath sounds normal. No respiratory distress.  no wheezes.  Abdominal: Soft. Bowel sounds are normal. exhibits no distension. There is no tenderness.  Lymphadenopathy:  no cervical adenopathy.  Neurological:  alert and oriented to person, place, and time.  Skin: Skin is warm and dry. No rash noted. No erythema.  Psychiatric: a normal mood and affect.  behavior is normal.      Assessment & Plan:  hiv = continue on truvada/DRVr. Still has meds until she gets adap approval  Varicella meningitis= improved symptoms. We will  continue to follow up on mTB CSF culture results. If she is still symptomatic at her next visit, we will consider repeat MRI. Unclear the significance of her her low level EBV detection in CSF.  Heavy menses = will want to do depot injection in order to see if it minimizes heaving menstrual bleeding, will arrange for her to get depot once her menses resolved. Currently on day 2 of cycle.  rtc in 6-8wks

## 2013-09-28 LAB — FUNGUS CULTURE W SMEAR: Fungal Smear: NONE SEEN

## 2013-09-30 ENCOUNTER — Ambulatory Visit (INDEPENDENT_AMBULATORY_CARE_PROVIDER_SITE_OTHER): Payer: Medicaid Other | Admitting: *Deleted

## 2013-09-30 DIAGNOSIS — Z309 Encounter for contraceptive management, unspecified: Secondary | ICD-10-CM

## 2013-09-30 DIAGNOSIS — B20 Human immunodeficiency virus [HIV] disease: Secondary | ICD-10-CM

## 2013-09-30 DIAGNOSIS — Z3009 Encounter for other general counseling and advice on contraception: Secondary | ICD-10-CM

## 2013-09-30 MED ORDER — MEDROXYPROGESTERONE ACETATE 150 MG/ML IM SUSP
150.0000 mg | Freq: Once | INTRAMUSCULAR | Status: AC
  Administered 2013-09-30: 150 mg via INTRAMUSCULAR

## 2013-10-11 LAB — AFB CULTURE WITH SMEAR (NOT AT ARMC): Acid Fast Smear: NONE SEEN

## 2013-10-24 ENCOUNTER — Other Ambulatory Visit: Payer: Self-pay

## 2013-10-27 ENCOUNTER — Other Ambulatory Visit (INDEPENDENT_AMBULATORY_CARE_PROVIDER_SITE_OTHER): Payer: Medicaid Other

## 2013-10-27 ENCOUNTER — Other Ambulatory Visit: Payer: Self-pay | Admitting: Internal Medicine

## 2013-10-27 ENCOUNTER — Other Ambulatory Visit: Payer: Self-pay | Admitting: *Deleted

## 2013-10-27 DIAGNOSIS — B20 Human immunodeficiency virus [HIV] disease: Secondary | ICD-10-CM

## 2013-10-27 LAB — CBC WITH DIFFERENTIAL/PLATELET
Basophils Absolute: 0 10*3/uL (ref 0.0–0.1)
Basophils Relative: 0 % (ref 0–1)
Eosinophils Absolute: 0.1 10*3/uL (ref 0.0–0.7)
Eosinophils Relative: 2 % (ref 0–5)
HCT: 31.3 % — ABNORMAL LOW (ref 36.0–46.0)
Hemoglobin: 10.5 g/dL — ABNORMAL LOW (ref 12.0–15.0)
Lymphocytes Relative: 34 % (ref 12–46)
Lymphs Abs: 1.1 10*3/uL (ref 0.7–4.0)
MCH: 27.2 pg (ref 26.0–34.0)
MCHC: 33.5 g/dL (ref 30.0–36.0)
MCV: 81.1 fL (ref 78.0–100.0)
Monocytes Absolute: 0.5 10*3/uL (ref 0.1–1.0)
Monocytes Relative: 17 % — ABNORMAL HIGH (ref 3–12)
Neutro Abs: 1.5 10*3/uL — ABNORMAL LOW (ref 1.7–7.7)
Neutrophils Relative %: 47 % (ref 43–77)
Platelets: 264 10*3/uL (ref 150–400)
RBC: 3.86 MIL/uL — ABNORMAL LOW (ref 3.87–5.11)
RDW: 17.5 % — ABNORMAL HIGH (ref 11.5–15.5)
WBC: 3.2 10*3/uL — ABNORMAL LOW (ref 4.0–10.5)

## 2013-10-27 LAB — COMPREHENSIVE METABOLIC PANEL
ALT: 9 U/L (ref 0–35)
AST: 14 U/L (ref 0–37)
Albumin: 4.4 g/dL (ref 3.5–5.2)
Alkaline Phosphatase: 74 U/L (ref 39–117)
BUN: 6 mg/dL (ref 6–23)
CO2: 23 mEq/L (ref 19–32)
Calcium: 9.2 mg/dL (ref 8.4–10.5)
Chloride: 105 mEq/L (ref 96–112)
Creat: 0.74 mg/dL (ref 0.50–1.10)
Glucose, Bld: 81 mg/dL (ref 70–99)
Potassium: 4.2 mEq/L (ref 3.5–5.3)
Sodium: 135 mEq/L (ref 135–145)
Total Bilirubin: 0.3 mg/dL (ref 0.3–1.2)
Total Protein: 7.7 g/dL (ref 6.0–8.3)

## 2013-10-27 MED ORDER — DRONABINOL 2.5 MG PO CAPS: 2.5000 mg | ORAL_CAPSULE | Freq: Two times a day (BID) | ORAL | Status: DC

## 2013-10-27 MED ORDER — DARUNAVIR ETHANOLATE 800 MG PO TABS: 800.0000 mg | ORAL_TABLET | Freq: Every day | ORAL | Status: DC

## 2013-10-27 MED ORDER — RITONAVIR 100 MG PO CAPS: 100.0000 mg | ORAL_CAPSULE | Freq: Every day | ORAL | Status: DC

## 2013-10-27 MED ORDER — EMTRICITABINE-TENOFOVIR DF 200-300 MG PO TABS: 1.0000 | ORAL_TABLET | Freq: Every day | ORAL | Status: DC

## 2013-10-27 NOTE — Telephone Encounter (Signed)
Pt needs PAP smear appt scheduled ASAP.

## 2013-10-28 LAB — T-HELPER CELL (CD4) - (RCID CLINIC ONLY)
CD4 % Helper T Cell: 16 % — ABNORMAL LOW (ref 33–55)
CD4 T Cell Abs: 150 /uL — ABNORMAL LOW (ref 400–2700)

## 2013-10-30 LAB — HIV-1 RNA QUANT-NO REFLEX-BLD
HIV 1 RNA Quant: 7026 copies/mL — ABNORMAL HIGH (ref <20)
HIV-1 RNA Quant, Log: 3.85 {Log} — ABNORMAL HIGH (ref <1.30)

## 2013-11-02 ENCOUNTER — Ambulatory Visit (INDEPENDENT_AMBULATORY_CARE_PROVIDER_SITE_OTHER): Payer: Medicaid Other | Admitting: *Deleted

## 2013-11-02 DIAGNOSIS — Z124 Encounter for screening for malignant neoplasm of cervix: Secondary | ICD-10-CM

## 2013-11-02 NOTE — Progress Notes (Signed)
  Subjective:     Kelsey Rodriguez is a 22 y.o. woman who comes in today for a  pap smear only.  Previous abnormal Pap smears: no. Contraception: Depo injection, condoms  Objective:  LMP: 09/28/13  Pelvic Exam:  Pap smear obtained.   Assessment:    Screening pap smear.   Plan:    Follow up in one year, or as indicated by Pap results.  Pt given educational materials re: HIV and women, self-esteem, nutrition and diet management, PAP smears and partner safety. Pt given condoms.

## 2013-11-02 NOTE — Patient Instructions (Signed)
Your results will be ready in about a week.  I will mail them to you.  Thank you for coming to the Center for your care.  Blenda Wisecup,  RN 

## 2013-11-08 ENCOUNTER — Ambulatory Visit (INDEPENDENT_AMBULATORY_CARE_PROVIDER_SITE_OTHER): Payer: Medicaid Other | Admitting: Internal Medicine

## 2013-11-08 ENCOUNTER — Encounter: Payer: Self-pay | Admitting: Internal Medicine

## 2013-11-08 VITALS — BP 137/79 | HR 92 | Temp 98.9°F | Wt 135.0 lb

## 2013-11-08 DIAGNOSIS — B20 Human immunodeficiency virus [HIV] disease: Secondary | ICD-10-CM

## 2013-11-08 DIAGNOSIS — B373 Candidiasis of vulva and vagina: Secondary | ICD-10-CM

## 2013-11-08 MED ORDER — SULFAMETHOXAZOLE-TRIMETHOPRIM 400-80 MG PO TABS: 1.0000 | ORAL_TABLET | Freq: Every day | ORAL | Status: DC

## 2013-11-08 MED ORDER — FLUCONAZOLE 150 MG PO TABS: 150.0000 mg | ORAL_TABLET | Freq: Every day | ORAL | Status: DC

## 2013-11-08 NOTE — Progress Notes (Signed)
[facility] CLIN[facility] NOTE  RFV: routine Subjective:    Patient ID: Kelsey Rodriguez, female    DOB: 25-Sep-1991, 22 y.o.   MRN: [id number]  HPI 22yo F with HIV c/b mTB, zoster and hospitalized for varicella meningitis over the summer. CD 4 count of 150/VL7,026 (mid Nov 2014), on truvada/DRVr. Now discloses that she only takes 5 of 7 days of medications. She states that she usually takes medication in evening after returning home from work. Her mother reminds her to take medications. She has pill box but not using currently. When asking her why she misses so many doses, she is unable to describe why other than trying harder.  Current Outpatient Prescriptions on File Prior to Visit  Medication Sig Dispense Refill  . Artificial Tear Ointment (EYE LUBRICANT) OINT Apply 1 application to eye as needed (both eyes).      . Darunavir Ethanolate (PREZISTA) 800 MG tablet Take 1 tablet (800 mg total) by mouth daily.  30 tablet  11  . dronabinol (MARINOL) 2.5 MG capsule Take 1 capsule (2.5 mg total) by mouth 2 (two) times daily before a meal.  60 capsule  2  . emtricitabine-tenofovir (TRUVADA) 200-300 MG per tablet Take 1 tablet by mouth daily.  30 tablet  11  . ferrous sulfate 325 (65 FE) MG tablet Take 325 mg by mouth 2 (two) times daily.      . Multiple Vitamin (MULTIVITAMIN WITH MINERALS) TABS Take 1 tablet by mouth daily.      . pregabalin (LYRICA) 150 MG capsule Take 1 capsule (150 mg total) by mouth 2 (two) times daily.  60 capsule  3  . ritonavir (NORVIR) 100 MG capsule Take 1 capsule (100 mg total) by mouth daily.  30 capsule  11  . valACYclovir (VALTREX) 1000 MG tablet Take 1 tablet (1,000 mg total) by mouth 3 (three) times daily.  8 tablet  0  . valACYclovir (VALTREX) 1000 MG tablet Take 1 tablet (1,000 mg total) by mouth daily. To start after finishing 3 times a day course of therapy  30 tablet  11   No current facility-administered medications on file prior to visit.   Active Ambulatory Problems   Diagnosis Date Noted  . Dysphagia 03/11/2012  . 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 Anemia of chronic disease 03/11/2012  . Reflux esophagitis 03/11/2012  . 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 HIV (human immunodeficiency virus infection) 03/16/2012  . Arthralgia 05/20/2012  . 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 Vaginal Discharge 05/30/2012  . Laceration of ankle, right 11/18/2012  . Chest pain 07/07/2013  . 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 Bell's palsy 08/26/2013   Resolved Ambulatory Problems    Diagnosis Date Noted  . Chest pain 03/11/2012  . Muscle strain of chest wall 05/30/2013   Past Medical History  Diagnosis Date  . Tuberculosis   . Pelvic pain       Review of Systems + body aches, no fever, no chills , nightsweats. No cough. No nausea/vomiting    Objective:   Physical Exam BP 137/79  Pulse 92  Temp(Src) 98.9 F (37.2 C) (Oral)  Wt 135 lb (61.236 kg)  LMP 11/05/2013 Physical Exam  Constitutional:  oriented to person, place, and time. He appears well-developed and well-nourished. No distress.  HENT:  Mouth/Throat: Oropharynx is clear and moist. No oropharyngeal exudate.  Cardiovascular: Normal rate, regular rhythm and normal heart sounds. Exam reveals no gallop and no friction rub.  No murmur heard.  Pulmonary/Chest: Effort normal  and breath sounds normal. No respiratory distress.  no wheezes.  Abdominal: Soft. Bowel sounds are normal.  exhibits no distension. There is no tenderness.  Lymphadenopathy:  no cervical adenopathy.  Neurological:  alert and oriented to person, place, and time.  Skin: Skin is warm and dry. No rash noted. No erythema.  Psychiatric:  a normal mood and affect. His behavior is normal.      Assessment & Plan:  hiv = will add on genotype and II resistance in anticipation to start new regimen in 2-3 wks. Continue with current regimen  Adherence = spent 40 min with greater than 50% spent in in direct counseling  regarding adherence. precontemplative to how to improver her behavior to improve resistance  oi prophylaxis = will start bactrim  Vaginal candidiasis = will refill fluconazole   Body aches = tylenol   rtc in  2-3 wks

## 2013-11-08 NOTE — Progress Notes (Signed)
  [facility] - Pharmacist    HPI: Kelsey Rodriguez is a 22 y.o. female here for follow-up of lab results.  Allergies: No Known Allergies  Vitals: Temp: 98.9 F (37.2 C) (11/25 1529) Temp src: Oral (11/25 1529) BP: 137/79 mmHg (11/25 1529) Pulse Rate: 92 (11/25 1529)  Past Medical History: Past Medical History  Diagnosis Date  . Tuberculosis   . HIV (human immunodeficiency virus infection) 02/2012  . Pelvic pain     Social History: History   Social History  . Marital Status: Single    Spouse Name: N/A    Number of Children: N/A  . Years of Education: N/A   Social History Main Topics  . Smoking status: Never Smoker   . Smokeless tobacco: Never Used  . Alcohol Use: No     Comment: socially  . Drug Use: No  . Sexual Activity: Not Currently    Partners: Male    Birth Control/ Protection: None     Comment: pt. given condoms   Other Topics Concern  . None   Social History Narrative  . None    Current Regimen: Darunavir/ritonavir, Truvada  Labs: HIV 1 RNA Quant (copies/mL)  Date Value  10/27/2013 7026*  07/21/2013 685*  06/23/2013 4231*     CD4 T Cell Abs (/uL)  Date Value  10/27/2013 150*  07/21/2013 330*  06/23/2013 240*     Hep B S Ab (no units)  Date Value  03/13/2012 NEGATIVE      Hepatitis B Surface Ag (no units)  Date Value  03/13/2012 NEGATIVE      HCV Ab (no units)  Date Value  03/13/2012 NEGATIVE     CrCl: The CrCl is unknown because both a height and weight (above a minimum accepted value) are required for this calculation.  Lipids:    Component Value Date/Time   CHOL 215* 12/16/2012 1433   TRIG 78 12/16/2012 1433   HDL 64 12/16/2012 1433   CHOLHDL 3.4 12/16/2012 1433   VLDL 16 12/16/2012 1433   LDLCALC 135* 12/16/2012 1433    Assessment: Kelsey Rodriguez is no longer virologically controlled.  She missed at least 1 week of medications due to a lapse in her ADAP coverage, and she admits missing at least 1-2 days per week of  her medications.  Recommendations: We discussed strategies to improve adherence, including pillboxes (not interested), setting cell phone reminders (maybe), and use of medication adherence apps (interested).  I provided her with a brochure on one medication adherence app. Consequences of medication nonadherence were also discussed. She may need genotyping to further determine an optimal regimen for her.  Will discuss with Dr. Drue Second.  Sallee Provencal, Pharm.D., BCPS, AAHIVP Clinical Infectious Disease Pharmacist [facility] 11/08/2013, 4:11 PM

## 2013-11-17 LAB — HIV-1 GENOTYPR PLUS

## 2013-11-22 ENCOUNTER — Encounter: Payer: Self-pay | Admitting: Internal Medicine

## 2013-11-22 ENCOUNTER — Ambulatory Visit (INDEPENDENT_AMBULATORY_CARE_PROVIDER_SITE_OTHER): Payer: Medicaid Other | Admitting: Internal Medicine

## 2013-11-22 VITALS — BP 127/70 | HR 88 | Temp 97.6°F | Wt 136.0 lb

## 2013-11-22 DIAGNOSIS — Z21 Asymptomatic human immunodeficiency virus [HIV] infection status: Secondary | ICD-10-CM

## 2013-11-22 DIAGNOSIS — B20 Human immunodeficiency virus [HIV] disease: Secondary | ICD-10-CM

## 2013-11-22 NOTE — Progress Notes (Signed)
Subjective:    Patient ID: Kelsey Rodriguez, female    DOB: January 31, 1991, 22 y.o.   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 CD 4 count of 150/VL7,026 (mid Nov 2014), on truvada/DRVr. She states that she had been out of meds for 2 wks during her reapplication of ADAP at the time of the lab draw. At her last visit, 3 wks ago, she also disclosed that she only takes 5 of 7 days of medications. At her last visit, she was heavily counseled on adherence. She states she is now taking her meds at lunch time, in addn to bactrim. She states she is feeling better. Good energy level. Lab work from last visit shows:   Geno: L10V,K20R, M36I but unable to do integrase inhibitor as an add on.  She previously was on RLG when she was on mTB treatment but then had viremia due to missing 2nd dose of RLG often. She had difficulty with BID regimen. Unfortunately, did not document any II resistance at that time.  Current Outpatient Prescriptions on File Prior to Visit  Medication Sig Dispense Refill  . Artificial Tear Ointment (EYE LUBRICANT) OINT Apply 1 application to eye as needed (both eyes).      . Darunavir Ethanolate (PREZISTA) 800 MG tablet Take 1 tablet (800 mg total) by mouth daily.  30 tablet  11  . dronabinol (MARINOL) 2.5 MG capsule Take 1 capsule (2.5 mg total) by mouth 2 (two) times daily before a meal.  60 capsule  2  . emtricitabine-tenofovir (TRUVADA) 200-300 MG per tablet Take 1 tablet by mouth daily.  30 tablet  11  . ferrous sulfate 325 (65 FE) MG tablet Take 325 mg by mouth 2 (two) times daily.      . fluconazole (DIFLUCAN) 150 MG tablet Take 1 tablet (150 mg total) by mouth daily. x1 per vaginal yeast infection. Can take additional dose if symptoms persist  5 tablet  1  . Multiple Vitamin (MULTIVITAMIN WITH MINERALS) TABS Take 1 tablet by mouth daily.      . pregabalin (LYRICA) 150 MG capsule Take 1 capsule (150 mg total) by mouth 2  (two) times daily.  60 capsule  3  . ritonavir (NORVIR) 100 MG capsule Take 1 capsule (100 mg total) by mouth daily.  30 capsule  11  . sulfamethoxazole-trimethoprim (BACTRIM) 400-80 MG per tablet Take 1 tablet by mouth daily.  30 tablet  5  . valACYclovir (VALTREX) 1000 MG tablet Take 1 tablet (1,000 mg total) by mouth 3 (three) times daily.  8 tablet  0  . valACYclovir (VALTREX) 1000 MG tablet Take 1 tablet (1,000 mg total) by mouth daily. To start after finishing 3 times a day course of therapy  30 tablet  11   No current facility-administered medications on file prior to visit.      Review of Systems  Constitutional: Negative for fever, chills, diaphoresis, activity change, appetite change, fatigue and unexpected weight change.  HENT: Negative for congestion, sore throat, rhinorrhea, sneezing, trouble swallowing and sinus pressure.  Eyes: Negative for photophobia and visual disturbance.  Respiratory: Negative for cough, chest tightness, shortness of breath, wheezing and stridor.  Cardiovascular: Negative for chest pain, palpitations and leg swelling.  Gastrointestinal: Negative for nausea, vomiting, abdominal pain, diarrhea, constipation, blood in stool, abdominal distention and anal bleeding.  Genitourinary: Negative for dysuria, hematuria, flank pain and difficulty urinating.  Musculoskeletal: Negative for myalgias, back pain,  joint swelling, arthralgias and gait problem.  Skin: Negative for color change, pallor, rash and wound.  Neurological: Negative for dizziness, tremors, weakness and light-headedness.  Hematological: Negative for adenopathy. Does not bruise/bleed easily.  Psychiatric/Behavioral: Negative for behavioral problems, confusion, sleep disturbance, dysphoric mood, decreased concentration and agitation.       Objective:   Physical Exam BP 127/70  Pulse 88  Temp(Src) 97.6 F (36.4 C) (Oral)  Wt 136 lb (61.689 kg)  LMP 11/05/2013  Constitutional:  oriented to  person, place, and time.  appears well-developed and well-nourished. No distress.  HENT:  Mouth/Throat: Oropharynx is clear and moist. No oropharyngeal exudate.  Cardiovascular: Normal rate, regular rhythm and normal heart sounds. Exam reveals no gallop and no friction rub.  No murmur heard.  Pulmonary/Chest: Effort normal and breath sounds normal. No respiratory distress.  no wheezes.  Lymphadenopathy:  no cervical adenopathy.  Skin: Skin is warm and dry. No rash noted. No erythema.  Psychiatric:a normal mood and affect. behavior is normal.       Assessment & Plan:  HIV= not well controlled, we will give her benefit of the doubt that her viremia was due to temporarily stopping her ART due to insurance issues. Still concern for adherence. Recommended that she continue with Taking meds daily and we will check cd 4 count and viral load with reflex integrase geno in 4 wks.  oi proph = continue with bactrim  Health maintenance = to get depo injection in 6 wks.  25 min spent with patient with greater than 50% on adherence  rtc in 6 wks

## 2013-12-20 ENCOUNTER — Other Ambulatory Visit (INDEPENDENT_AMBULATORY_CARE_PROVIDER_SITE_OTHER): Payer: Medicaid Other

## 2013-12-20 DIAGNOSIS — B20 Human immunodeficiency virus [HIV] disease: Secondary | ICD-10-CM

## 2013-12-20 LAB — CBC WITH DIFFERENTIAL/PLATELET
Basophils Absolute: 0 10*3/uL (ref 0.0–0.1)
Basophils Relative: 1 % (ref 0–1)
Eosinophils Absolute: 0.2 10*3/uL (ref 0.0–0.7)
Eosinophils Relative: 5 % (ref 0–5)
HCT: 30.3 % — ABNORMAL LOW (ref 36.0–46.0)
Hemoglobin: 9.9 g/dL — ABNORMAL LOW (ref 12.0–15.0)
Lymphocytes Relative: 50 % — ABNORMAL HIGH (ref 12–46)
Lymphs Abs: 2 10*3/uL (ref 0.7–4.0)
MCH: 26.7 pg (ref 26.0–34.0)
MCHC: 32.7 g/dL (ref 30.0–36.0)
MCV: 81.7 fL (ref 78.0–100.0)
Monocytes Absolute: 0.7 10*3/uL (ref 0.1–1.0)
Monocytes Relative: 17 % — ABNORMAL HIGH (ref 3–12)
Neutro Abs: 1.1 10*3/uL — ABNORMAL LOW (ref 1.7–7.7)
Neutrophils Relative %: 27 % — ABNORMAL LOW (ref 43–77)
Platelets: 193 10*3/uL (ref 150–400)
RBC: 3.71 MIL/uL — ABNORMAL LOW (ref 3.87–5.11)
RDW: 19.6 % — ABNORMAL HIGH (ref 11.5–15.5)
WBC: 3.9 10*3/uL — ABNORMAL LOW (ref 4.0–10.5)

## 2013-12-21 LAB — COMPREHENSIVE METABOLIC PANEL
ALT: 12 U/L (ref 0–35)
AST: 18 U/L (ref 0–37)
Albumin: 4.1 g/dL (ref 3.5–5.2)
Alkaline Phosphatase: 67 U/L (ref 39–117)
BUN: 12 mg/dL (ref 6–23)
CO2: 24 mEq/L (ref 19–32)
Calcium: 8.5 mg/dL (ref 8.4–10.5)
Chloride: 104 mEq/L (ref 96–112)
Creat: 0.71 mg/dL (ref 0.50–1.10)
Glucose, Bld: 75 mg/dL (ref 70–99)
Potassium: 4.1 mEq/L (ref 3.5–5.3)
Sodium: 136 mEq/L (ref 135–145)
Total Bilirubin: 0.2 mg/dL — ABNORMAL LOW (ref 0.3–1.2)
Total Protein: 7.4 g/dL (ref 6.0–8.3)

## 2013-12-22 LAB — T-HELPER CELL (CD4) - (RCID CLINIC ONLY)
CD4 % Helper T Cell: 18 % — ABNORMAL LOW (ref 33–55)
CD4 T Cell Abs: 370 /uL — ABNORMAL LOW (ref 400–2700)

## 2013-12-23 LAB — HIV-1 RNA QUANT-NO REFLEX-BLD
HIV 1 RNA Quant: 215 copies/mL — ABNORMAL HIGH (ref <20)
HIV-1 RNA Quant, Log: 2.33 {Log} — ABNORMAL HIGH (ref <1.30)

## 2013-12-26 ENCOUNTER — Other Ambulatory Visit: Payer: Self-pay | Admitting: *Deleted

## 2013-12-26 ENCOUNTER — Encounter: Payer: Self-pay | Admitting: Infectious Diseases

## 2013-12-26 ENCOUNTER — Ambulatory Visit (INDEPENDENT_AMBULATORY_CARE_PROVIDER_SITE_OTHER): Payer: Medicaid Other | Admitting: Infectious Diseases

## 2013-12-26 ENCOUNTER — Other Ambulatory Visit: Payer: Self-pay | Admitting: Infectious Diseases

## 2013-12-26 ENCOUNTER — Ambulatory Visit (HOSPITAL_COMMUNITY)
Admission: RE | Admit: 2013-12-26 | Discharge: 2013-12-26 | Disposition: A | Discharge status: Home / Self Care | Payer: Medicaid Other | Source: Ambulatory Visit | Attending: Infectious Diseases | Admitting: Infectious Diseases

## 2013-12-26 VITALS — BP 124/73 | HR 102 | Temp 98.2°F | Ht 65.0 in | Wt 137.0 lb

## 2013-12-26 DIAGNOSIS — R079 Chest pain, unspecified: Secondary | ICD-10-CM | POA: Insufficient documentation

## 2013-12-26 DIAGNOSIS — B20 Human immunodeficiency virus [HIV] disease: Secondary | ICD-10-CM

## 2013-12-26 DIAGNOSIS — Z21 Asymptomatic human immunodeficiency virus [HIV] infection status: Secondary | ICD-10-CM

## 2013-12-26 MED ORDER — IBUPROFEN 400 MG PO TABS: 400.0000 mg | ORAL_TABLET | Freq: Four times a day (QID) | ORAL | Status: DC | PRN

## 2013-12-26 MED ORDER — OSELTAMIVIR PHOSPHATE 75 MG PO CAPS: 75.0000 mg | ORAL_CAPSULE | Freq: Two times a day (BID) | ORAL | Status: DC

## 2013-12-26 NOTE — Progress Notes (Signed)
   Subjective:    Patient ID: Kelsey Rodriguez, female    DOB: December 19, 1990, 23 y.o.   MRN: [id number]  Cough Associated symptoms include chest pain. Pertinent negatives include no chills, fever, headaches or shortness of breath.   23yo F born in [place] (came to [place] in 2011) with HIV+, TB (April 2013), zoster and varicella meningitis summer 2014. [name] Has been on truvada/DRVr. Here today as walk in with 2 days of L sided pleuritic pain- worse with breathing, coughing. Cough is occasional. No rashes. No fever or chills.  No problems with ART.   HIV 1 RNA Quant (copies/mL)  Date Value  12/20/2013 215*  10/27/2013 7026*  07/21/2013 685*     CD4 T Cell Abs (/uL)  Date Value  12/20/2013 370*  10/27/2013 150*  07/21/2013 330*    Review of Systems  Constitutional: Negative for fever, chills, appetite change and unexpected weight change.  Respiratory: Positive for cough. Negative for shortness of breath.   Cardiovascular: Positive for chest pain.  Gastrointestinal: Negative for diarrhea and constipation.  Genitourinary: Negative for difficulty urinating and menstrual problem.       Getting depo shot, menses are irregular.   Neurological: Negative for headaches.  last PAP November 2014 (normal).      Objective:   Physical Exam  Constitutional: She appears well-developed and well-nourished.  Eyes: EOM are normal. Pupils are equal, round, and reactive to light.  Neck: Normal range of motion. Neck supple.  Cardiovascular: Normal rate, regular rhythm and normal heart sounds.   Pulmonary/Chest: Effort normal and breath sounds normal. She exhibits tenderness.  Abdominal: Soft. Bowel sounds are normal. There is no tenderness. There is no rebound.  Skin: No rash noted.          Assessment & Plan:

## 2013-12-26 NOTE — Assessment & Plan Note (Signed)
She appears to be doing well but needs to get to undetectable. Can stop bactrim (instructed pt). Will rtc prn. Offered/refused condoms. vax up to date.

## 2013-12-26 NOTE — Assessment & Plan Note (Signed)
Suspect she has costocondritis, ? From flu. Will give her tamiflu, motrin, check her CXR.

## 2013-12-29 ENCOUNTER — Ambulatory Visit (INDEPENDENT_AMBULATORY_CARE_PROVIDER_SITE_OTHER): Payer: Medicaid Other | Admitting: Internal Medicine

## 2013-12-29 VITALS — BP 117/75 | HR 90 | Temp 98.7°F | Ht 65.0 in | Wt 135.5 lb

## 2013-12-29 DIAGNOSIS — IMO0001 Reserved for inherently not codable concepts without codable children: Secondary | ICD-10-CM

## 2013-12-29 DIAGNOSIS — B20 Human immunodeficiency virus [HIV] disease: Secondary | ICD-10-CM

## 2013-12-29 DIAGNOSIS — Z309 Encounter for contraceptive management, unspecified: Secondary | ICD-10-CM

## 2013-12-29 MED ORDER — SULFAMETHOXAZOLE-TRIMETHOPRIM 400-80 MG PO TABS: 1.0000 | ORAL_TABLET | Freq: Every day | ORAL | Status: DC

## 2013-12-29 MED ORDER — MEDROXYPROGESTERONE ACETATE 150 MG/ML IM SUSP
150.0000 mg | Freq: Once | INTRAMUSCULAR | Status: AC
  Administered 2013-12-29: 150 mg via INTRAMUSCULAR

## 2013-12-29 NOTE — Progress Notes (Signed)
Subjective:    Patient ID: Kelsey Rodriguez, female    DOB: 15-Feb-1991, 23 y.o.   MRN: [id number]  HPI 23yo F with HIV, Cd 4 count of 370/VL 215 (jan 2015) improved from 150/7,025 (nov 2014) when she was off meds. She has now been taking truvada-boosted darunavir for the past month. She was seen on Monday for chest pain thought to be due to costochondritis 2/2 respiratory virus.     Current Outpatient Prescriptions on File Prior to Visit  Medication Sig Dispense Refill  . Darunavir Ethanolate (PREZISTA) 800 MG tablet Take 1 tablet (800 mg total) by mouth daily.  30 tablet  11  . dronabinol (MARINOL) 2.5 MG capsule Take 1 capsule (2.5 mg total) by mouth 2 (two) times daily before a meal.  60 capsule  2  . emtricitabine-tenofovir (TRUVADA) 200-300 MG per tablet Take 1 tablet by mouth daily.  30 tablet  11  . ferrous sulfate 325 (65 FE) MG tablet Take 325 mg by mouth 2 (two) times daily.      [name] ibuprofen (ADVIL,MOTRIN) 400 MG tablet Take 1 tablet (400 mg total) by mouth every 6 (six) hours as needed.  30 tablet  0  . Multiple Vitamin (MULTIVITAMIN WITH MINERALS) TABS Take 1 tablet by mouth daily.      [name] oseltamivir (TAMIFLU) 75 MG capsule Take 1 capsule (75 mg total) by mouth 2 (two) times daily.  10 capsule  0  . pregabalin (LYRICA) 150 MG capsule Take 1 capsule (150 mg total) by mouth 2 (two) times daily.  60 capsule  3  . ritonavir (NORVIR) 100 MG capsule Take 1 capsule (100 mg total) by mouth daily.  30 capsule  11  . valACYclovir (VALTREX) 1000 MG tablet Take 1 tablet (1,000 mg total) by mouth daily. To start after finishing 3 times a day course of therapy  30 tablet  11   No current facility-administered medications on file prior to visit.   Active Ambulatory Problems    Diagnosis Date Noted  . Dysphagia 03/11/2012  . 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 Anemia of chronic disease 03/11/2012  . Reflux esophagitis 03/11/2012  . 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 HIV  (human immunodeficiency virus infection) 03/16/2012  . Arthralgia 05/20/2012  . 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 Vaginal Discharge 05/30/2012  . Laceration of ankle, right 11/18/2012  . Chest pain 07/07/2013  . 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 Bell's palsy 08/26/2013   Resolved Ambulatory Problems    Diagnosis Date Noted  . Chest pain 03/11/2012  . Muscle strain of chest wall 05/30/2013   Past Medical History  Diagnosis Date  . Tuberculosis   . Pelvic pain       Review of Systems     Objective:   Physical Exam BP 117/75  Pulse 90  Temp(Src) 98.7 F (37.1 C) (Oral)  Ht 5\' 5"  (1.651 m)  Wt 135 lb 8 oz (61.462 kg)  BMI 22.55 kg/m2  LMP 11/28/2013 Physical Exam  Constitutional:  oriented to person, place, and time. appears well-developed and well-nourished. No distress.  HENT:  Mouth/Throat: Oropharynx is clear and moist. No oropharyngeal exudate.  Cardiovascular: Normal rate, regular rhythm and normal heart sounds. Exam reveals no gallop and no friction rub.  No murmur heard. No tenderness of on chest wall. Pulmonary/Chest: Effort normal and breath sounds normal. No respiratory distress. no wheezes.  Abdominal: Soft. Bowel sounds are normal. He exhibits no distension. There is no tenderness.  Lymphadenopathy:  no cervical adenopathy.  Skin: Skin is warm and dry. No rash noted. No erythema.         Assessment & Plan:  hiv = doing well thus far with adherence. Nearly has virologic control after 1 month of resuming medications. Will have repeat blood work in 2 months to see if she has viral suppression after being on medication for 3 months.  oi proph = will continue bactrim until march since she just started medications for CD 4 count>200  Chest pain = localized MSK pain to her keloid scar that now is resolved, will continue to follow. If she is still having pain, would consider getting chest ct.  Cough = nonproductive cough, now improved.  Health maintenance =  received depo today

## 2013-12-30 ENCOUNTER — Ambulatory Visit: Payer: Self-pay

## 2014-02-28 ENCOUNTER — Other Ambulatory Visit: Payer: Medicaid Other

## 2014-02-28 DIAGNOSIS — Z79899 Other long term (current) drug therapy: Secondary | ICD-10-CM

## 2014-02-28 DIAGNOSIS — B20 Human immunodeficiency virus [HIV] disease: Secondary | ICD-10-CM

## 2014-02-28 DIAGNOSIS — Z113 Encounter for screening for infections with a predominantly sexual mode of transmission: Secondary | ICD-10-CM

## 2014-02-28 LAB — COMPREHENSIVE METABOLIC PANEL
ALT: 12 U/L (ref 0–35)
AST: 18 U/L (ref 0–37)
Albumin: 4.4 g/dL (ref 3.5–5.2)
Alkaline Phosphatase: 69 U/L (ref 39–117)
BUN: 10 mg/dL (ref 6–23)
CO2: 24 mEq/L (ref 19–32)
Calcium: 9.2 mg/dL (ref 8.4–10.5)
Chloride: 106 mEq/L (ref 96–112)
Creat: 0.77 mg/dL (ref 0.50–1.10)
Glucose, Bld: 80 mg/dL (ref 70–99)
Potassium: 4 mEq/L (ref 3.5–5.3)
Sodium: 137 mEq/L (ref 135–145)
Total Bilirubin: 0.2 mg/dL (ref 0.2–1.2)
Total Protein: 7.8 g/dL (ref 6.0–8.3)

## 2014-02-28 LAB — LIPID PANEL
Cholesterol: 223 mg/dL — ABNORMAL HIGH (ref 0–200)
HDL: 58 mg/dL (ref >39)
LDL Cholesterol: 142 mg/dL — ABNORMAL HIGH (ref 0–99)
Total CHOL/HDL Ratio: 3.8 Ratio
Triglycerides: 113 mg/dL (ref <150)
VLDL: 23 mg/dL (ref 0–40)

## 2014-02-28 LAB — CBC WITH DIFFERENTIAL/PLATELET
Basophils Absolute: 0.1 10*3/uL (ref 0.0–0.1)
Basophils Relative: 1 % (ref 0–1)
Eosinophils Absolute: 0.2 10*3/uL (ref 0.0–0.7)
Eosinophils Relative: 4 % (ref 0–5)
HCT: 32 % — ABNORMAL LOW (ref 36.0–46.0)
Hemoglobin: 10.6 g/dL — ABNORMAL LOW (ref 12.0–15.0)
Lymphocytes Relative: 49 % — ABNORMAL HIGH (ref 12–46)
Lymphs Abs: 2.6 10*3/uL (ref 0.7–4.0)
MCH: 26.8 pg (ref 26.0–34.0)
MCHC: 33.1 g/dL (ref 30.0–36.0)
MCV: 81 fL (ref 78.0–100.0)
Monocytes Absolute: 0.6 10*3/uL (ref 0.1–1.0)
Monocytes Relative: 11 % (ref 3–12)
Neutro Abs: 1.9 10*3/uL (ref 1.7–7.7)
Neutrophils Relative %: 35 % — ABNORMAL LOW (ref 43–77)
Platelets: 302 10*3/uL (ref 150–400)
RBC: 3.95 MIL/uL (ref 3.87–5.11)
RDW: 16.5 % — ABNORMAL HIGH (ref 11.5–15.5)
WBC: 5.4 10*3/uL (ref 4.0–10.5)

## 2014-03-01 ENCOUNTER — Ambulatory Visit: Payer: Medicaid Other

## 2014-03-01 ENCOUNTER — Other Ambulatory Visit: Payer: Self-pay | Admitting: Internal Medicine

## 2014-03-01 LAB — RPR

## 2014-03-02 LAB — T-HELPER CELL (CD4) - (RCID CLINIC ONLY)
CD4 % Helper T Cell: 21 % — ABNORMAL LOW (ref 33–55)
CD4 T Cell Abs: 540 /uL (ref 400–2700)

## 2014-03-02 LAB — HIV-1 RNA QUANT-NO REFLEX-BLD
HIV 1 RNA Quant: 22 copies/mL — ABNORMAL HIGH (ref <20)
HIV-1 RNA Quant, Log: 1.34 {Log} — ABNORMAL HIGH (ref <1.30)

## 2014-03-07 ENCOUNTER — Encounter (HOSPITAL_COMMUNITY): Payer: Self-pay | Admitting: Internal Medicine

## 2014-03-07 ENCOUNTER — Encounter (HOSPITAL_COMMUNITY)
Admission: EM | Disposition: A | Discharge status: Home / Self Care | Payer: Self-pay | Source: Home / Self Care | Attending: Emergency Medicine

## 2014-03-07 ENCOUNTER — Emergency Department (HOSPITAL_COMMUNITY)
Admission: EM | Admit: 2014-03-07 | Discharge: 2014-03-07 | Disposition: A | Discharge status: Home / Self Care | Payer: Medicaid Other | Attending: Emergency Medicine | Admitting: Emergency Medicine

## 2014-03-07 ENCOUNTER — Emergency Department (HOSPITAL_COMMUNITY): Payer: Medicaid Other

## 2014-03-07 DIAGNOSIS — R1310 Dysphagia, unspecified: Secondary | ICD-10-CM | POA: Insufficient documentation

## 2014-03-07 DIAGNOSIS — Z8611 Personal history of tuberculosis: Secondary | ICD-10-CM | POA: Insufficient documentation

## 2014-03-07 DIAGNOSIS — R0989 Other specified symptoms and signs involving the circulatory and respiratory systems: Secondary | ICD-10-CM

## 2014-03-07 DIAGNOSIS — K222 Esophageal obstruction: Secondary | ICD-10-CM | POA: Insufficient documentation

## 2014-03-07 DIAGNOSIS — B20 Human immunodeficiency virus [HIV] disease: Secondary | ICD-10-CM | POA: Insufficient documentation

## 2014-03-07 HISTORY — PX: ESOPHAGOGASTRODUODENOSCOPY: SHX5428

## 2014-03-07 HISTORY — DX: Gastro-esophageal reflux disease with esophagitis: K21.0

## 2014-03-07 HISTORY — DX: Lymphocytic choriomeningitis: A87.2

## 2014-03-07 HISTORY — DX: Other herpesviral infection: B00.89

## 2014-03-07 HISTORY — DX: Anemia in other chronic diseases classified elsewhere: D63.8

## 2014-03-07 HISTORY — DX: Bell's palsy: G51.0

## 2014-03-07 HISTORY — DX: Tuberculosis of intrathoracic lymph nodes: A15.4

## 2014-03-07 HISTORY — DX: Other skin changes: R23.8

## 2014-03-07 HISTORY — DX: Laceration without foreign body, right ankle, initial encounter: S91.011A

## 2014-03-07 SURGERY — EGD (ESOPHAGOGASTRODUODENOSCOPY)
Anesthesia: Moderate Sedation

## 2014-03-07 MED ORDER — FENTANYL CITRATE 0.05 MG/ML IJ SOLN
INTRAMUSCULAR | Status: DC | PRN
  Administered 2014-03-07 (×3): 25 ug via INTRAVENOUS

## 2014-03-07 MED ORDER — FENTANYL CITRATE 0.05 MG/ML IJ SOLN
INTRAMUSCULAR | Status: AC
  Filled 2014-03-07: qty 4

## 2014-03-07 MED ORDER — MAGIC MOUTHWASH: 5.0000 mL | Freq: Three times a day (TID) | ORAL | Status: DC | PRN

## 2014-03-07 MED ORDER — BUTAMBEN-TETRACAINE-BENZOCAINE 2-2-14 % EX AERO
INHALATION_SPRAY | CUTANEOUS | Status: DC | PRN
  Administered 2014-03-07: 2 via TOPICAL

## 2014-03-07 MED ORDER — DIPHENHYDRAMINE HCL 50 MG/ML IJ SOLN
INTRAMUSCULAR | Status: AC
  Filled 2014-03-07: qty 1

## 2014-03-07 MED ORDER — MIDAZOLAM HCL 10 MG/2ML IJ SOLN
INTRAMUSCULAR | Status: DC | PRN
  Administered 2014-03-07 (×3): 2.5 mg via INTRAVENOUS

## 2014-03-07 MED ORDER — MIDAZOLAM HCL 5 MG/ML IJ SOLN
INTRAMUSCULAR | Status: AC
  Filled 2014-03-07: qty 3

## 2014-03-07 MED ORDER — GLYCOPYRROLATE 0.2 MG/ML IJ SOLN
0.2000 mg | Freq: Once | INTRAMUSCULAR | Status: AC
  Administered 2014-03-07: 0.2 mg via INTRAVENOUS
  Filled 2014-03-07: qty 1

## 2014-03-07 MED ORDER — GI COCKTAIL ~~LOC~~
30.0000 mL | Freq: Once | ORAL | Status: AC
  Administered 2014-03-07: 30 mL via ORAL
  Filled 2014-03-07: qty 30

## 2014-03-07 NOTE — Discharge Instructions (Signed)
Be sure to stick to a soft food diet including liquids, apple sauce, mash potatoes, jello, soup, and other similar food products while experiencing throat pain. Follow up with Dr. Simeon Craft, ear nose and throat (ENT) doctor for continued pain.  If having difficulty breathing or unable to swallow liquids return to ER for further evaluation.

## 2014-03-07 NOTE — ED Notes (Addendum)
Pt given 1 sip of  GI cocktail.  Pt still expressing pain to throat; unable to swallow rest of GI cocktail due to pain.  No drooling noted. Per PA, GI to be consulted.

## 2014-03-07 NOTE — ED Notes (Signed)
Consent obtained at bedside with Dr. Carlean Purl.

## 2014-03-07 NOTE — ED Notes (Signed)
Pt reports eating catfish earlier today around lunch time.  She felt like she swallowed a bone.  Sts her throat has been hurting since "feels like something pierced my throat".  Airway intact.  Pt reports being able to swallow water and bread.  No drooling noted.  Resps e/u.  Pt primarily reporting a lot of pain.  Sts she did cough up some bone.

## 2014-03-07 NOTE — ED Provider Notes (Signed)
CSN: [id number]     Arrival date & time 03/07/14  1344 History   First MD Initiated Contact with Patient 03/07/14 1517     Chief Complaint  Patient presents with  . Swallowed Foreign Body     (Consider location/radiation/quality/duration/timing/severity/associated sxs/prior Treatment) HPI Pt is a 23yo female with hx of TB and HIV presenting to ED c/o throat pain and sore throat after eating a catfish earlier today around 11:30am for lunch. Pt states it feels like she swallowed a bone. Pt states throat has been hurting since. "feels like something pierced my throat."  Pt states she was able to swallow water and bread earlier but states it was very painful.  Pt states she did cough up some bone but feels like it is still in there.  Denies SOB but states it hurts to breath and swallow.  Pain is constant, 10/10, aching.  Denies cough, nausea, or vomiting.  Past Medical History  Diagnosis Date  . Tuberculosis   . HIV (human immunodeficiency virus infection) 02/2012  . Pelvic pain   . 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 Bell's palsy 08/26/2013  . Anemia of chronic disease 03/11/2012  . 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 Laceration of ankle, right 11/18/2012  . Reflux esophagitis 03/11/2012  . 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 Esophagogastroduodenoscopy  03/11/2012    Procedure: ESOPHAGOGASTRODUODENOSCOPY (EGD);  Surgeon: Lafayette Dragon, MD;  Location: [facility] ENDOSCOPY;  Service: Endoscopy;  Laterality: N/A;  . Appendectomy  ~ 2000  . Lung biopsy  02/2012  . Dilation and curettage of uterus  2008   History reviewed. No pertinent family history. History  Substance Use Topics  . Smoking status: Never Smoker   . Smokeless tobacco: Never Used  . Alcohol Use: No     Comment: socially   OB History   Grav Para Term Preterm Abortions TAB SAB Ect Mult Living   1 0 0 0 1 0 1 0 0 0      Review of  Systems  Constitutional: Negative for fever and chills.  HENT: Positive for sore throat and trouble swallowing. Negative for voice change.   Respiratory: Positive for choking. Negative for shortness of breath, wheezing and stridor.   Cardiovascular: Negative for chest pain.  Gastrointestinal: Negative for nausea, vomiting and abdominal pain.  All other systems reviewed and are negative.      Allergies  Review of patient's allergies indicates no known allergies.  Home Medications   No current outpatient prescriptions on file. BP 125/86  Pulse 89  Temp(Src) 98.4 F (36.9 C) (Oral)  Resp 20  Ht 5\' 5"  (1.651 m)  Wt 138 lb (62.596 kg)  BMI 22.96 kg/m2  SpO2 100% Physical Exam  Nursing note and vitals reviewed. Constitutional: She appears well-developed and well-nourished.  Pt sitting on exam bed, appears uncomfortable. Whispering. Occasionally spitting saliva.   HENT:  Head: Normocephalic and atraumatic.  No foreign body visualized in oropharynx  Eyes: Conjunctivae are normal. No scleral icterus.  Neck: Normal range of motion. Neck supple.  No stridor.   Cardiovascular: Normal rate, regular rhythm and normal heart sounds.   Pulmonary/Chest: Effort normal and breath sounds normal. No stridor. No respiratory distress. She has no wheezes. She has no rales. She exhibits no tenderness.  No respiratory distress. Able to speak in full sentences but whispering.  Lungs: CTAB  Abdominal: Soft. Bowel sounds are normal. She exhibits no  distension and no mass. There is no tenderness. There is no rebound and no guarding.  Musculoskeletal: Normal range of motion.  Neurological: She is alert.  Skin: Skin is warm and dry.    ED Course  Procedures (including critical care time) Labs Review Labs Reviewed - No data to display Imaging Review No results found.   EKG Interpretation None      MDM   Final diagnoses:  Dysphagia    pt c/o foreign body sensation and sore throat after  eating catfish around 11:30am. Pt is in no respiratory distress but appears uncomfortable.  Pt appears uncomfortable. States she was able to cough up some fish bone but still feels like there is something in there and is painful.  Has had water and bread PTA but reports severe pain.  Pt had 1 sip of GI cocktail, became tearful.  Medication discontinued. Discussed pt with Dr. Audie Pinto, will consult with GI.   4:31 PM Consulted with GI who agreed to come see pt.    5:05 PM Dr. Carlean Purl, GI, has seen pt and will take over pt care to perform EGD.     Noland Fordyce, PA-C 03/07/14 1910

## 2014-03-07 NOTE — ED Notes (Signed)
GI at bedside

## 2014-03-07 NOTE — ED Notes (Signed)
Pt transported to Endoscopy.  Pt may or may not be returning to ED.

## 2014-03-07 NOTE — ED Provider Notes (Signed)
Medical screening examination/treatment/procedure(s) were performed by non-physician practitioner and as supervising physician I was immediately available for consultation/collaboration.    Dot Lanes, MD 03/07/14 [phone]

## 2014-03-07 NOTE — Op Note (Signed)
[facility] [place], [id number]   ENDOSCOPY PROCEDURE REPORT  PATIENT: Kelsey, Rodriguez  MR#: [id number] BIRTHDATE: 08-04-1991 , 23  yrs. old GENDER: Female ENDOSCOPIST: Gatha Mayer, MD, [facility] PROCEDURE DATE:  03/07/2014 PROCEDURE:  Esophagoscopy ASA CLASS:     Class II INDICATIONS:  Odynophagia and suspected fish bone in esophagus. MEDICATIONS: Fentanyl 50 mcg IV and Versed 5 mg IV TOPICAL ANESTHETIC: Cetacaine Spray  DESCRIPTION OF PROCEDURE: After the risks benefits and alternatives of the procedure were thoroughly explained, informed consent was obtained.  The Pentax Gastroscope [id number] endoscope was introduced through the mouth and advanced to the stomach antrum. Without limitations.  The instrument was slowly withdrawn as the mucosa was fully examined.        ESOPHAGUS: The mucosa of the esophagus appeared normal.  STOMACH: The mucosa of the stomach appeared normal.  Retroflexed views revealed no abnormalities.     The scope was then withdrawn from the patient and the procedure completed.  COMPLICATIONS: There were no complications. ENDOSCOPIC IMPRESSION: 1.   The mucosa of the esophagus appeared normal 2.   The mucosa of the stomach appeared normal  RECOMMENDATIONS: Observe - if persistent foreign body sensation consider xray or ENT evaluation.  She could just have a foreign body sensation or it is possible a bone is in pharyngeal area - I looked as best as I could but din not see any there and no foreign body in esophagus or stomach.   eSigned:  Gatha Mayer, MD, [facility] 03/07/2014 6:58 PM

## 2014-03-07 NOTE — ED Provider Notes (Signed)
Pt examined by myself earlier this evening for foreign body sensation in her throat after eating catfish. Pt was taken upstairs by Dr. Carlean Purl for EGD, however no foreign body was seen during exam.  Pt still c/o 10/10 so pt was sent back to ED for soft tissue neck films.   Vitals: unremarkable. 100% O2 on RA. No respiratory distress.   Plain films of soft tissue neck and CXR performed.  No radiopaque foreign body identified.  Pt states she does feel comfortable being discharged home.    9:52 PM pt able to keep down several ounces of PO fluids. Will discharge home and have f/u with Dr. Simeon Craft, ENT if symptoms not improving. Rx: magic mouthwash. Advised to stick to soft diet to allow throat to heal.  Return precautions provided. Pt verbalized understanding and agreement with tx plan.  Discussed pt with Dr. Audie Pinto who agrees with plan.    Noland Fordyce, PA-C 03/08/14 0005

## 2014-03-07 NOTE — Interval H&P Note (Signed)
History and Physical Interval Note:  03/07/2014 6:36 PM  Kelsey Rodriguez  has presented today for surgery, with the diagnosis of swallowed fish bone and got stuck.  The various methods of treatment have been discussed with the patient and family. After consideration of risks, benefits and other options for treatment, the patient has consented to  Procedure(s): ESOPHAGOGASTRODUODENOSCOPY (EGD) (N/A) as a surgical intervention .  The patient's history has been reviewed, patient examined, no change in status, stable for surgery.  I have reviewed the patient's chart and labs.  Questions were answered to the patient's satisfaction.     Silvano Rusk

## 2014-03-07 NOTE — ED Notes (Addendum)
Pt was seen here earlier because she thinks she swallowed a fish bone. Pt was transferred to Endoscopy for further studies but was negative for foreign body. Pt transferred back down here to [facility] ED POD C room 23 due to 10/10 pain with swallowing and will order soft tissue imaging and chest x-ray. Pt currently sleeping and blood pressure cuff on and pts oxygen saturation maintained at 100% on 2L [place].

## 2014-03-07 NOTE — H&P (Addendum)
[facility] History and Physical   Primary Care Physician:  No primary provider on file.   Reason for Procedure:   Esophageal obstruction and odynophagia after eating fish  Plan:    Esophagoscopy and foreign body removal     HPI: Kelsey Rodriguez is a 23 y.o. female originally from [place] with painful swallowing and inability to clear saliva after eating fish at 1130 today. She points to suprasaternal notch as site of pain. Spitting saliva frequently. Denies other recent dysphagia.   Past Medical History  Diagnosis Date  . Tuberculosis   . HIV (human immunodeficiency virus infection) 02/2012  . Pelvic pain   . 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 Bell's palsy 08/26/2013  . Anemia of chronic disease 03/11/2012  . 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 Laceration of ankle, right 11/18/2012  . Reflux esophagitis 03/11/2012  . 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 Bullae 05/30/2012     Past Surgical History  Procedure Laterality Date  . Esophagogastroduodenoscopy  03/11/2012    Procedure: ESOPHAGOGASTRODUODENOSCOPY (EGD);  Surgeon: Lafayette Dragon, MD;  Location: [facility] ENDOSCOPY;  Service: Endoscopy;  Laterality: N/A;  . Appendectomy  ~ 2000  . Lung biopsy  02/2012  . Dilation and curettage of uterus  2008    Prior to Admission medications   Medication Sig Start Date End Date Taking? Authorizing Provider  Darunavir Ethanolate (PREZISTA) 800 MG tablet Take 1 tablet (800 mg total) by mouth daily. 10/27/13   Carlyle Basques, MD  dronabinol (MARINOL) 2.5 MG capsule Take 1 capsule (2.5 mg total) by mouth 2 (two) times daily before a meal. 10/27/13   Carlyle Basques, MD  emtricitabine-tenofovir (TRUVADA) 200-300 MG per tablet Take 1 tablet by mouth daily. 10/27/13   Carlyle Basques, MD  ferrous sulfate 325 (65 FE) MG tablet Take 325 mg by mouth 2 (two) times daily.    Historical Provider, MD  ibuprofen (ADVIL,MOTRIN) 400 MG tablet Take 1 tablet (400 mg total) by  mouth every 6 (six) hours as needed. 12/26/13   Campbell Riches, MD  Multiple Vitamin (MULTIVITAMIN WITH MINERALS) TABS Take 1 tablet by mouth daily.    Historical Provider, MD  oseltamivir (TAMIFLU) 75 MG capsule Take 1 capsule (75 mg total) by mouth 2 (two) times daily. 12/26/13   Campbell Riches, MD  pregabalin (LYRICA) 150 MG capsule Take 1 capsule (150 mg total) by mouth 2 (two) times daily. 09/07/13   Carlyle Basques, MD  ritonavir (NORVIR) 100 MG capsule Take 1 capsule (100 mg total) by mouth daily. 10/27/13   Carlyle Basques, MD  sulfamethoxazole-trimethoprim (BACTRIM,SEPTRA) 400-80 MG per tablet TAKE 1 TABLET BY MOUTH EVERY DAY 03/01/14   Carlyle Basques, MD  valACYclovir (VALTREX) 1000 MG tablet Take 1 tablet (1,000 mg total) by mouth daily. To start after finishing 3 times a day course of therapy 08/30/13   Carlyle Basques, MD    No current facility-administered medications for this encounter.   Current Outpatient Prescriptions  Medication Sig Dispense Refill  . Darunavir Ethanolate (PREZISTA) 800 MG tablet Take 1 tablet (800 mg total) by mouth daily.  30 tablet  11  . dronabinol (MARINOL) 2.5 MG capsule Take 1 capsule (2.5 mg total) by mouth 2 (two) times daily before a meal.  60 capsule  2  . emtricitabine-tenofovir (TRUVADA) 200-300 MG per tablet Take 1 tablet by mouth daily.  30 tablet  11  . ferrous sulfate 325 (65 FE) MG tablet Take 325 mg by mouth 2 (two) times  daily.      . ibuprofen (ADVIL,MOTRIN) 400 MG tablet Take 1 tablet (400 mg total) by mouth every 6 (six) hours as needed.  30 tablet  0  . Multiple Vitamin (MULTIVITAMIN WITH MINERALS) TABS Take 1 tablet by mouth daily.      [name] oseltamivir (TAMIFLU) 75 MG capsule Take 1 capsule (75 mg total) by mouth 2 (two) times daily.  10 capsule  0  . pregabalin (LYRICA) 150 MG capsule Take 1 capsule (150 mg total) by mouth 2 (two) times daily.  60 capsule  3  . ritonavir (NORVIR) 100 MG capsule Take 1 capsule (100 mg total) by mouth  daily.  30 capsule  11  . sulfamethoxazole-trimethoprim (BACTRIM,SEPTRA) 400-80 MG per tablet TAKE 1 TABLET BY MOUTH EVERY DAY  30 tablet  0  . valACYclovir (VALTREX) 1000 MG tablet Take 1 tablet (1,000 mg total) by mouth daily. To start after finishing 3 times a day course of therapy  30 tablet  11    Allergies as of 03/07/2014  . (No Known Allergies)    No family history on file.  History   Social History  . Marital Status: Single    Spouse Name: N/A    Number of Children: N/A  . Years of Education: N/A   Occupational History  . Not on file.   Social History Main Topics  . Smoking status: Never Smoker   . Smokeless tobacco: Never Used  . Alcohol Use: No     Comment: socially  . Drug Use: No  . Sexual Activity: Not Currently    Partners: Male    Birth Control/ Protection: None     Comment: pt. given condoms   Other Topics Concern  . Not on file   Social History Narrative  . No narrative on file    Review of Systems: As per HPI  Physical Exam: Vital signs in last 24 hours: Temp:  [98.4 F (36.9 C)] 98.4 F (36.9 C) (03/24 1353) Pulse Rate:  [75] 75 (03/24 1353) Resp:  [18] 18 (03/24 1353) BP: (119)/(85) 119/85 mmHg (03/24 1353) SpO2:  [100 %] 100 % (03/24 1353)   General:   Alert,  Well-developed, well-nourished, pleasant and cooperative in mild distress, pain - drooling at times Lungs:  Clear throughout to auscultation.   Heart:  Regular rate and rhythm; no murmurs, clicks, rubs,  or gallops. Neuro/Psych:  Alert and cooperative. Normal mood and affect. A and O x 3   @Carl  Simonne Maffucci, MD, [facility] [phone] (pager) 03/07/2014 4:48 PM@

## 2014-03-07 NOTE — ED Notes (Signed)
Pt given cup of water per order from [place], [place]. Pt able to swallow water with no difficulty but does report some pain only when swallowing. Airway clear and pt able to speak in clear, complete sentences. Pt A&Ox4, ambulatory at discharge.

## 2014-03-07 NOTE — ED Notes (Signed)
Pt reports she feels as though she has some fish bones stuck in her throat since about 1130 this AM. Pt reports swallowing difficulty and pain. Pt with patent airway, resp even unlabored at present.

## 2014-03-08 ENCOUNTER — Encounter (HOSPITAL_COMMUNITY): Payer: Self-pay | Admitting: Internal Medicine

## 2014-03-13 ENCOUNTER — Telehealth: Payer: Self-pay | Admitting: *Deleted

## 2014-03-13 NOTE — Telephone Encounter (Signed)
Patient called and advised that she has had a menstrual cycle since daily her last Depo injection 12/29/13 and that for the past week she has a sever pain at the bottom of her stomach that is getting worse. She advised she can not take the pain anymore. She advised the flow from the menstrual is usually light but sometimes heavy and that it has been daily. She also reports being tired and achy but no fever, headache or shortness of breath. She [facility] to know [facility]. Advised her to call them as she was seen there last year and is a patient of record and that if they will not see her she probably needs to go to the ED if the pain is that bad and she has had a cycle daily for over 2 months. She advised she will call them first and see if they will see her and then call us back. Advised her will let Dr Baxter Flattery know what is giong on but that she is out of the clinic until 03/20/14.

## 2014-03-14 ENCOUNTER — Ambulatory Visit: Payer: Self-pay | Admitting: Internal Medicine

## 2014-03-23 ENCOUNTER — Other Ambulatory Visit: Payer: Self-pay | Admitting: *Deleted

## 2014-03-23 ENCOUNTER — Telehealth: Payer: Self-pay | Admitting: *Deleted

## 2014-03-23 DIAGNOSIS — N946 Dysmenorrhea, unspecified: Secondary | ICD-10-CM

## 2014-03-23 MED ORDER — IBUPROFEN 600 MG PO TABS: 600.0000 mg | ORAL_TABLET | Freq: Four times a day (QID) | ORAL | Status: DC | PRN

## 2014-03-23 NOTE — Telephone Encounter (Signed)
She can take ibuprofen 800mg  TID prn for back pain. Full stomach.

## 2014-03-23 NOTE — Progress Notes (Signed)
Verbal for ibuprofen 600mg  q6 per Dr. Baxter Flattery.

## 2014-03-23 NOTE — Telephone Encounter (Signed)
Patient called, stating that she was having back pain due to menstrual cramps.  Pt states she took 1 500 mg tylenol yesterday with some relief, would like a prescription for 600 mg ibuprofen.  RN advised patient that I will ask Dr. Baxter Flattery for the refills, but that she could pick up ibuprofen from a local dollar store and use that in the mean time.  RN educated patient about tylenol daily limits, patient verbalized understanding and agreement. Landis Gandy, RN

## 2014-03-27 ENCOUNTER — Emergency Department (HOSPITAL_COMMUNITY)
Admission: EM | Admit: 2014-03-27 | Discharge: 2014-03-28 | Disposition: A | Discharge status: Home / Self Care | Payer: Medicaid Other | Attending: Emergency Medicine | Admitting: Emergency Medicine

## 2014-03-27 ENCOUNTER — Emergency Department (HOSPITAL_COMMUNITY)
Admission: EM | Admit: 2014-03-27 | Discharge: 2014-03-27 | Disposition: A | Discharge status: Other Acute Inpatient Hospital | Payer: Medicaid Other | Source: Home / Self Care | Attending: Family Medicine | Admitting: Family Medicine

## 2014-03-27 ENCOUNTER — Telehealth: Payer: Self-pay | Admitting: *Deleted

## 2014-03-27 ENCOUNTER — Encounter (HOSPITAL_COMMUNITY): Payer: Self-pay | Admitting: Emergency Medicine

## 2014-03-27 DIAGNOSIS — M542 Cervicalgia: Secondary | ICD-10-CM | POA: Insufficient documentation

## 2014-03-27 DIAGNOSIS — M25519 Pain in unspecified shoulder: Secondary | ICD-10-CM | POA: Insufficient documentation

## 2014-03-27 DIAGNOSIS — Z8742 Personal history of other diseases of the female genital tract: Secondary | ICD-10-CM | POA: Insufficient documentation

## 2014-03-27 DIAGNOSIS — Z8611 Personal history of tuberculosis: Secondary | ICD-10-CM | POA: Insufficient documentation

## 2014-03-27 DIAGNOSIS — Z8719 Personal history of other diseases of the digestive system: Secondary | ICD-10-CM | POA: Insufficient documentation

## 2014-03-27 DIAGNOSIS — Y929 Unspecified place or not applicable: Secondary | ICD-10-CM | POA: Insufficient documentation

## 2014-03-27 DIAGNOSIS — Z3202 Encounter for pregnancy test, result negative: Secondary | ICD-10-CM | POA: Insufficient documentation

## 2014-03-27 DIAGNOSIS — Z21 Asymptomatic human immunodeficiency virus [HIV] infection status: Secondary | ICD-10-CM | POA: Insufficient documentation

## 2014-03-27 DIAGNOSIS — Z872 Personal history of diseases of the skin and subcutaneous tissue: Secondary | ICD-10-CM | POA: Insufficient documentation

## 2014-03-27 DIAGNOSIS — R079 Chest pain, unspecified: Secondary | ICD-10-CM | POA: Insufficient documentation

## 2014-03-27 DIAGNOSIS — X58XXXA Exposure to other specified factors, initial encounter: Secondary | ICD-10-CM | POA: Insufficient documentation

## 2014-03-27 DIAGNOSIS — Z79899 Other long term (current) drug therapy: Secondary | ICD-10-CM | POA: Insufficient documentation

## 2014-03-27 DIAGNOSIS — H538 Other visual disturbances: Secondary | ICD-10-CM | POA: Insufficient documentation

## 2014-03-27 DIAGNOSIS — R6883 Chills (without fever): Secondary | ICD-10-CM | POA: Insufficient documentation

## 2014-03-27 DIAGNOSIS — R51 Headache: Secondary | ICD-10-CM

## 2014-03-27 DIAGNOSIS — T148XXA Other injury of unspecified body region, initial encounter: Secondary | ICD-10-CM

## 2014-03-27 DIAGNOSIS — IMO0002 Reserved for concepts with insufficient information to code with codable children: Secondary | ICD-10-CM | POA: Insufficient documentation

## 2014-03-27 DIAGNOSIS — Z8619 Personal history of other infectious and parasitic diseases: Secondary | ICD-10-CM | POA: Insufficient documentation

## 2014-03-27 DIAGNOSIS — Y939 Activity, unspecified: Secondary | ICD-10-CM | POA: Insufficient documentation

## 2014-03-27 DIAGNOSIS — D638 Anemia in other chronic diseases classified elsewhere: Secondary | ICD-10-CM | POA: Insufficient documentation

## 2014-03-27 DIAGNOSIS — M549 Dorsalgia, unspecified: Secondary | ICD-10-CM

## 2014-03-27 DIAGNOSIS — R519 Headache, unspecified: Secondary | ICD-10-CM

## 2014-03-27 NOTE — Telephone Encounter (Signed)
Patient called and advised that she has a constant pain in her chest and back that feels like it did when she was first treated for TB and she is afraid. She advised no fever but that the pain is worse when she moves around or walks. She wants to be seen asap. Advised her we have an appt 03/30/14 with Dr Johnnye Sima if she wants to be seen. She took the appt but advised she may go to the ED or Urgent care if she starts to feel worse.

## 2014-03-27 NOTE — ED Notes (Signed)
Pt states she has been having back and head pain since wednesday

## 2014-03-27 NOTE — ED Provider Notes (Signed)
CSN: [id number]     Arrival date & time 03/27/14  1816 History   None    Chief Complaint  Patient presents with  . Generalized Body Aches   (Consider location/radiation/quality/duration/timing/severity/associated sxs/prior Treatment) HPI Comments: Patient reports she has had 6 days of mid thoracic back pain that radiates to her neck with associated headache. She also reports epigastric discomfort that radiates through to her back that began 3-4 days ago. Headache, neck pain and back pain are all made worse with movement, ambulation and movement of her head. Denies fever/chills. No reported injury. Patient is HIV+ and has had two very similar episodes of above symptoms in the past. Both in July 2014 and September 2014 she states her presentation was very similar and she was ultimately diagnosed with and hospitalized with acute meningitis. Review of old records confirms this.  She states when current episode of symptoms began she discussed with her PCP (Dr. Baxter Flattery at [facility]) and was prescribed ibuprofen 600 mg Q hrs which she has been using. Because symptoms have persisted, she contacted Dr. Baxter Flattery again today who recommended she seek evaluation.  Denies N/V/D/C, flank pain, dysuria, or hematuria.  No change in epigastric symptoms with meals, only with movement.   The history is provided by the patient.    Past Medical History  Diagnosis Date  . Tuberculosis   . HIV (human immunodeficiency virus infection) 02/2012  . Pelvic pain   . 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 Bell's palsy 08/26/2013  . Anemia of chronic disease 03/11/2012  . 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 Laceration of ankle, right 11/18/2012  . Reflux esophagitis 03/11/2012  . 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 Esophagogastroduodenoscopy  03/11/2012    Procedure: ESOPHAGOGASTRODUODENOSCOPY (EGD);  Surgeon: Lafayette Dragon, MD;   Location: [facility] ENDOSCOPY;  Service: Endoscopy;  Laterality: N/A;  . Appendectomy  ~ 2000  . Lung biopsy  02/2012  . Dilation and curettage of uterus  2008  . Esophagogastroduodenoscopy N/A 03/07/2014    Procedure: ESOPHAGOGASTRODUODENOSCOPY (EGD);  Surgeon: Gatha Mayer, MD;  Location: [facility] ENDOSCOPY;  Service: Endoscopy;  Laterality: N/A;   History reviewed. No pertinent family history. History  Substance Use Topics  . Smoking status: Never Smoker   . Smokeless tobacco: Never Used  . Alcohol Use: No     Comment: socially   OB History   Grav Para Term Preterm Abortions TAB SAB Ect Mult Living   1 0 0 0 1 0 1 0 0 0      Review of Systems  Constitutional: Negative for fever, chills, diaphoresis, activity change, appetite change, fatigue and unexpected weight change.  Eyes: Negative.   Respiratory: Negative.   Cardiovascular: Negative.   Gastrointestinal: Positive for abdominal pain. Negative for nausea, vomiting, diarrhea, constipation, blood in stool and abdominal distention.  Endocrine: Negative.   Genitourinary: Negative.   Musculoskeletal: Positive for back pain, neck pain and neck stiffness. Negative for arthralgias, gait problem, joint swelling and myalgias.  Skin: Negative.   Allergic/Immunologic: Positive for immunocompromised state.  Neurological: Positive for headaches. Negative for dizziness, tremors, seizures, syncope, speech difficulty, weakness, light-headedness and numbness.  Psychiatric/Behavioral: Negative.     Allergies  Review of patient's allergies indicates no known allergies.  Home Medications   Current Outpatient Rx  Name  Route  Sig  Dispense  Refill  . Alum & Mag Hydroxide-Simeth (MAGIC MOUTHWASH) SOLN   Oral  Take 5 mLs by mouth 3 (three) times daily as needed for mouth pain.   15 mL   0   . Darunavir Ethanolate (PREZISTA) 800 MG tablet   Oral   Take 800 mg by mouth daily with breakfast.         . dronabinol (MARINOL) 2.5 MG capsule   Oral    Take 2.5 mg by mouth 2 (two) times daily as needed (for appetite).         [name] emtricitabine-tenofovir (TRUVADA) 200-300 MG per tablet   Oral   Take 1 tablet by mouth daily.         . ferrous sulfate 325 (65 FE) MG tablet   Oral   Take 325 mg by mouth 2 (two) times daily.         [name] ibuprofen (ADVIL,MOTRIN) 600 MG tablet   Oral   Take 1 tablet (600 mg total) by mouth every 6 (six) hours as needed.   30 tablet   0   . Multiple Vitamin (MULTIVITAMIN WITH MINERALS) TABS   Oral   Take 1 tablet by mouth daily.         . ritonavir (NORVIR) 100 MG capsule   Oral   Take 100 mg by mouth daily with breakfast.         . sulfamethoxazole-trimethoprim (BACTRIM,SEPTRA) 400-80 MG per tablet   Oral   Take 1 tablet by mouth daily.          BP 126/78  Pulse 87  Temp(Src) 98.3 F (36.8 C) (Oral)  Resp 16  SpO2 100% Physical Exam  Nursing note and vitals reviewed. Constitutional: She is oriented to person, place, and time. She appears well-developed and well-nourished. No distress.  HENT:  Head: Normocephalic and atraumatic.  Mouth/Throat: Oropharynx is clear and moist.  Eyes: Conjunctivae and EOM are normal. Pupils are equal, round, and reactive to light. Right eye exhibits no discharge. Left eye exhibits no discharge. No scleral icterus.  Neck: Trachea normal, normal range of motion and phonation normal. Neck supple. Muscular tenderness present. No rigidity. Normal range of motion present. No thyromegaly present.  Reports moderate midline neck tenderness and exacerbation of headache with ROM of her neck  Cardiovascular: Normal rate, regular rhythm and normal heart sounds.   Pulmonary/Chest: Effort normal and breath sounds normal. No stridor. No respiratory distress. She has no wheezes.  Abdominal: Soft. Bowel sounds are normal. She exhibits no distension. There is no hepatosplenomegaly. There is tenderness in the epigastric area. There is no rigidity, no rebound, no guarding and  no CVA tenderness. Hernia confirmed negative in the ventral area.  Musculoskeletal: Normal range of motion.       Thoracic back: She exhibits normal range of motion, no bony tenderness, no swelling, no edema and no deformity.       Back:  Outlined area is area of tenderness with ROM of neck or torso  Lymphadenopathy:    She has no cervical adenopathy.  Neurological: She is alert and oriented to person, place, and time. She has normal strength. No cranial nerve deficit or sensory deficit. Coordination and gait normal. GCS eye subscore is 4. GCS verbal subscore is 5. GCS motor subscore is 6.  Skin: Skin is warm and dry. No rash noted.  Psychiatric: She has a normal mood and affect. Her behavior is normal.    ED Course  Procedures (including critical care time) Labs Review Labs Reviewed - No data to display Imaging Review No results found.  MDM   1. Back pain   2. Headache    While patient does not appear toxic/acutely ill at the time of [facility] visit, her history is made unusual by previous events and immunocompromised status. Advised patient that we do not have supplies here at the Urgent Care to perform a lumbar puncture to determine if she again has meningitis. Unfortunately, given hx of previous similar presentations, she would be best served to undergo LP evaluation. Will transfer to [facility] for further evaluation.    [place], [place] 03/27/14 2016

## 2014-03-27 NOTE — ED Notes (Signed)
C/o generalized body pain since 4-8. Pain less when at rest, worse when she moves her body or talks. Pain head, back, chest. NAD. Afebrile at present. W/d/color good. Has an appointment to se her MD in about a week, but was advised to come here for exam in meanwhile

## 2014-03-28 ENCOUNTER — Emergency Department (HOSPITAL_COMMUNITY): Payer: Medicaid Other

## 2014-03-28 LAB — CBC WITH DIFFERENTIAL/PLATELET
Basophils Absolute: 0.1 10*3/uL (ref 0.0–0.1)
Basophils Relative: 1 % (ref 0–1)
Eosinophils Absolute: 0.4 10*3/uL (ref 0.0–0.7)
Eosinophils Relative: 7 % — ABNORMAL HIGH (ref 0–5)
HCT: 32.7 % — ABNORMAL LOW (ref 36.0–46.0)
Hemoglobin: 11 g/dL — ABNORMAL LOW (ref 12.0–15.0)
Lymphocytes Relative: 40 % (ref 12–46)
Lymphs Abs: 2.3 10*3/uL (ref 0.7–4.0)
MCH: 27.7 pg (ref 26.0–34.0)
MCHC: 33.6 g/dL (ref 30.0–36.0)
MCV: 82.4 fL (ref 78.0–100.0)
Monocytes Absolute: 0.6 10*3/uL (ref 0.1–1.0)
Monocytes Relative: 9 % (ref 3–12)
Neutro Abs: 2.5 10*3/uL (ref 1.7–7.7)
Neutrophils Relative %: 43 % (ref 43–77)
Platelets: 230 10*3/uL (ref 150–400)
RBC: 3.97 MIL/uL (ref 3.87–5.11)
RDW: 16 % — ABNORMAL HIGH (ref 11.5–15.5)
WBC: 5.8 10*3/uL (ref 4.0–10.5)

## 2014-03-28 LAB — COMPREHENSIVE METABOLIC PANEL
ALT: 9 U/L (ref 0–35)
AST: 20 U/L (ref 0–37)
Albumin: 3.8 g/dL (ref 3.5–5.2)
Alkaline Phosphatase: 75 U/L (ref 39–117)
BUN: 10 mg/dL (ref 6–23)
CO2: 21 mEq/L (ref 19–32)
Calcium: 9.3 mg/dL (ref 8.4–10.5)
Chloride: 104 mEq/L (ref 96–112)
Creatinine, Ser: 0.68 mg/dL (ref 0.50–1.10)
GFR calc Af Amer: >90 mL/min (ref >90)
GFR calc non Af Amer: >90 mL/min (ref >90)
Glucose, Bld: 88 mg/dL (ref 70–99)
Potassium: 4.3 mEq/L (ref 3.7–5.3)
Sodium: 139 mEq/L (ref 137–147)
Total Bilirubin: <0.2 mg/dL — ABNORMAL LOW (ref 0.3–1.2)
Total Protein: 8 g/dL (ref 6.0–8.3)

## 2014-03-28 LAB — URINALYSIS, ROUTINE W REFLEX MICROSCOPIC
Bilirubin Urine: NEGATIVE
Glucose, UA: NEGATIVE mg/dL
Ketones, ur: NEGATIVE mg/dL
Leukocytes, UA: NEGATIVE
Nitrite: NEGATIVE
Protein, ur: NEGATIVE mg/dL
Specific Gravity, Urine: 1.021 (ref 1.005–1.030)
Urobilinogen, UA: 0.2 mg/dL (ref 0.0–1.0)
pH: 6.5 (ref 5.0–8.0)

## 2014-03-28 LAB — TROPONIN I: Troponin I: <0.3 ng/mL (ref <0.30)

## 2014-03-28 LAB — URINE MICROSCOPIC-ADD ON

## 2014-03-28 LAB — PREGNANCY, URINE: Preg Test, Ur: NEGATIVE

## 2014-03-28 MED ORDER — IBUPROFEN 600 MG PO TABS: 600.0000 mg | ORAL_TABLET | Freq: Four times a day (QID) | ORAL | Status: DC | PRN

## 2014-03-28 MED ORDER — METHOCARBAMOL 500 MG PO TABS: 500.0000 mg | ORAL_TABLET | Freq: Two times a day (BID) | ORAL | Status: DC | PRN

## 2014-03-28 MED ORDER — KETOROLAC TROMETHAMINE 60 MG/2ML IM SOLN
60.0000 mg | Freq: Once | INTRAMUSCULAR | Status: AC
  Administered 2014-03-28: 60 mg via INTRAMUSCULAR
  Filled 2014-03-28: qty 2

## 2014-03-28 NOTE — ED Provider Notes (Signed)
Medical screening examination/treatment/procedure(s) were performed by resident physician or non-physician practitioner and as supervising physician I was immediately available for consultation/collaboration.   Pauline Good MD.   Billy Fischer, MD 03/28/14 [phone]

## 2014-03-28 NOTE — Discharge Instructions (Signed)
Return immediately for neck stiffness, fever, persistent headache, focal weakness or numbness or for any concerns.  Muscle Strain A muscle strain is an injury that occurs when a muscle is stretched beyond its normal length. Usually a small number of muscle fibers are torn when this happens. Muscle strain is rated in degrees. First-degree strains have the least amount of muscle fiber tearing and pain. Second-degree and third-degree strains have increasingly more tearing and pain.  Usually, recovery from muscle strain takes 1 2 weeks. Complete healing takes 5 6 weeks.  CAUSES  Muscle strain happens when a sudden, violent force placed on a muscle stretches it too far. This may occur with lifting, sports, or a fall.  RISK FACTORS Muscle strain is especially common in athletes.  SIGNS AND SYMPTOMS At the site of the muscle strain, there may be:  Pain.  Bruising.  Swelling.  Difficulty using the muscle due to pain or lack of normal function. DIAGNOSIS  Your health care provider will perform a physical exam and ask about your medical history. TREATMENT  Often, the best treatment for a muscle strain is resting, icing, and applying cold compresses to the injured area.  HOME CARE INSTRUCTIONS   Use the PRICE method of treatment to promote muscle healing during the first 2 3 days after your injury. The PRICE method involves:  Protecting the muscle from being injured again.  Restricting your activity and resting the injured body part.  Icing your injury. To do this, put ice in a plastic bag. Place a towel between your skin and the bag. Then, apply the ice and leave it on from 15 20 minutes each hour. After the third day, switch to moist heat packs.  Apply compression to the injured area with a splint or elastic bandage. Be careful not to wrap it too tightly. This may interfere with blood circulation or increase swelling.  Elevate the injured body part above the level of your heart as often as  you can.  Only take over-the-counter or prescription medicines for pain, discomfort, or fever as directed by your health care provider.  Warming up prior to exercise helps to prevent future muscle strains. SEEK MEDICAL CARE IF:   You have increasing pain or swelling in the injured area.  You have numbness, tingling, or a significant loss of strength in the injured area. MAKE SURE YOU:   Understand these instructions.  Will watch your condition.  Will get help right away if you are not doing well or get worse. Document Released: 12/01/2005 Document Revised: 09/21/2013 Document Reviewed: 06/30/2013 [facility] Patient Information 2014 [place], [place].

## 2014-03-28 NOTE — ED Provider Notes (Signed)
CSN: [id number]     Arrival date & time 03/27/14  2035 History   First MD Initiated Contact with Patient 03/27/14 2356     Chief Complaint  Patient presents with  . Back Pain     (Consider location/radiation/quality/duration/timing/severity/associated sxs/prior Treatment) HPI Patient has a history of HIV with last C4 count one month ago greater than 500. She is followed by Dr. Graylon Good the ID clinic. She's been having about one week's worth of thoracic back pain radiating up into her neck. It is worse with turning her head from side to side. She also is a reports left-sided chest pain is worse with movement of her left arm and palpation. She complains of posterior headache has been gone for roughly the same period. His had no fever or chills. She has no focal weakness or numbness. She has no visual changes. Patient does have a history of previous hospitalization for meningitis. She's been taking ibuprofen and Tylenol with little relief. She was seen in the urgent care clinic and transferred to the emergency department for concern for possible meningitis and need for an LP. Past Medical History  Diagnosis Date  . Tuberculosis   . HIV (human immunodeficiency virus infection) 02/2012  . Pelvic pain   . 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 Bell's palsy 08/26/2013  . Anemia of chronic disease 03/11/2012  . 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 Laceration of ankle, right 11/18/2012  . Reflux esophagitis 03/11/2012  . 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 Esophagogastroduodenoscopy  03/11/2012    Procedure: ESOPHAGOGASTRODUODENOSCOPY (EGD);  Surgeon: Lafayette Dragon, MD;  Location: [facility] ENDOSCOPY;  Service: Endoscopy;  Laterality: N/A;  . Appendectomy  ~ 2000  . Lung biopsy  02/2012  . Dilation and curettage of uterus  2008  . Esophagogastroduodenoscopy N/A 03/07/2014    Procedure: ESOPHAGOGASTRODUODENOSCOPY  (EGD);  Surgeon: Gatha Mayer, MD;  Location: [facility] ENDOSCOPY;  Service: Endoscopy;  Laterality: N/A;   No family history on file. History  Substance Use Topics  . Smoking status: Never Smoker   . Smokeless tobacco: Never Used  . Alcohol Use: No     Comment: socially   OB History   Grav Para Term Preterm Abortions TAB SAB Ect Mult Living   1 0 0 0 1 0 1 0 0 0      Review of Systems  Constitutional: Positive for chills. Negative for fever.  HENT: Negative for congestion.   Eyes: Positive for visual disturbance.  Respiratory: Negative for chest tightness and shortness of breath.   Cardiovascular: Positive for chest pain. Negative for palpitations and leg swelling.  Gastrointestinal: Negative for nausea, vomiting, abdominal pain and diarrhea.  Musculoskeletal: Positive for back pain, myalgias and neck pain. Negative for neck stiffness.  Skin: Negative for rash and wound.  Neurological: Positive for headaches. Negative for dizziness, syncope, weakness, light-headedness and numbness.  All other systems reviewed and are negative.     Allergies  Review of patient's allergies indicates no known allergies.  Home Medications   Current Outpatient Rx  Name  Route  Sig  Dispense  Refill  . acetaminophen (TYLENOL) 500 MG tablet   Oral   Take 500 mg by mouth every 6 (six) hours as needed for moderate pain.         . Darunavir Ethanolate (PREZISTA) 800 MG tablet   Oral   Take 800 mg by mouth daily with breakfast.         .  emtricitabine-tenofovir (TRUVADA) 200-300 MG per tablet   Oral   Take 1 tablet by mouth daily.         [name] ibuprofen (ADVIL,MOTRIN) 600 MG tablet   Oral   Take 1 tablet (600 mg total) by mouth every 6 (six) hours as needed.   30 tablet   0   . Multiple Vitamin (MULTIVITAMIN WITH MINERALS) TABS   Oral   Take 1 tablet by mouth daily.         . ritonavir (NORVIR) 100 MG capsule   Oral   Take 100 mg by mouth daily with breakfast.          BP 120/80   Pulse 80  Temp(Src) 99 F (37.2 C) (Oral)  Resp 16  SpO2 100% Physical Exam  Nursing note and vitals reviewed. Constitutional: She is oriented to person, place, and time. She appears well-developed and well-nourished. No distress.  Patient is very well-appearing  HENT:  Head: Normocephalic and atraumatic.  Mouth/Throat: Oropharynx is clear and moist. No oropharyngeal exudate.  Eyes: EOM are normal. Pupils are equal, round, and reactive to light.  Neck: Normal range of motion. Neck supple.  Tenderness to palpation in the bilateral trapezius and thoracic paraspinal muscles. No definite meningeal signs.  Cardiovascular: Normal rate and regular rhythm.   Pulmonary/Chest: Effort normal and breath sounds normal. No respiratory distress. She has no wheezes. She has no rales. She exhibits tenderness (chest tenderness is completely reproduced with palpation of the left pectoralis muscle.).  Abdominal: Soft. Bowel sounds are normal. She exhibits no distension and no mass. There is no tenderness. There is no rebound and no guarding.  Musculoskeletal: Normal range of motion. She exhibits no edema and no tenderness.  No calf tenderness or swelling. No midline thoracic or lumbar tenderness.  Neurological: She is alert and oriented to person, place, and time.  Patient is alert and oriented x3 with clear, goal oriented speech. Patient has 5/5 motor in all extremities. Sensation is intact to light touch. Bilateral finger-to-nose is normal with no signs of dysmetria. Patient has a normal gait and walks without assistance.   Skin: Skin is warm and dry. No rash noted. No erythema.  Psychiatric: She has a normal mood and affect. Her behavior is normal.    ED Course  Procedures (including critical care time) Labs Review Labs Reviewed  CBC WITH DIFFERENTIAL  COMPREHENSIVE METABOLIC PANEL  URINALYSIS, ROUTINE W REFLEX MICROSCOPIC  PREGNANCY, URINE  TROPONIN I   Imaging Review No results found.    EKG Interpretation None      MDM   Final diagnoses:  None    Patient's symptoms appear to be more musculoskeletal. I discussed the pros and cons of lumbar puncture and she has agreed to have the test performed in the emergency department. We'll treat symptomatically and give informed consent signature.  Patient's initial workup is normal. She has a normal CT and normal white blood cell count. I went back to reevaluate the patient she says she's having absolutely no pain currently. She has a supple neck with no meningismus. I discussed the pros and cons of proceeding with a lumbar puncture. She states at this time she would like to forego the procedure. She states that she will return immediately to the emergency department for worsening pain, stiffness or fever. I believe this is appropriate given her CD4 status, negative workup thus far and normal neurologic exam.  Julianne Rice, MD 03/28/14 [phone]

## 2014-03-29 ENCOUNTER — Ambulatory Visit (INDEPENDENT_AMBULATORY_CARE_PROVIDER_SITE_OTHER): Payer: Medicaid Other | Admitting: Licensed Clinical Social Worker

## 2014-03-29 DIAGNOSIS — Z309 Encounter for contraceptive management, unspecified: Secondary | ICD-10-CM

## 2014-03-29 MED ORDER — MEDROXYPROGESTERONE ACETATE 150 MG/ML IM SUSP
150.0000 mg | Freq: Once | INTRAMUSCULAR | Status: AC
  Administered 2014-03-29: 150 mg via INTRAMUSCULAR

## 2014-03-30 ENCOUNTER — Ambulatory Visit: Payer: Medicaid Other | Admitting: Infectious Diseases

## 2014-04-06 ENCOUNTER — Ambulatory Visit (INDEPENDENT_AMBULATORY_CARE_PROVIDER_SITE_OTHER): Payer: Medicaid Other | Admitting: Internal Medicine

## 2014-04-06 ENCOUNTER — Encounter: Payer: Self-pay | Admitting: Internal Medicine

## 2014-04-06 VITALS — BP 118/72 | HR 78 | Temp 98.8°F | Wt 138.0 lb

## 2014-04-06 DIAGNOSIS — Z0000 Encounter for general adult medical examination without abnormal findings: Secondary | ICD-10-CM

## 2014-04-06 NOTE — Progress Notes (Signed)
Subjective:    Patient ID: Kelsey Rodriguez, female    DOB: 1991-07-31, 23 y.o.   MRN: [id number]  HPI Kelsey Rodriguez is a 23yo F with HIV, CD 4 coutn of 540/VL 22 ( march 2015), on truvada/DRVr. She was seen in the ED last week for back/neck pain headache that appeared to be c/w msk complaint. She states that [facility], [facility] have [facility]   Current Outpatient Prescriptions on File Prior to Visit  Medication Sig Dispense Refill  . acetaminophen (TYLENOL) 500 MG tablet Take 500 mg by mouth every 6 (six) hours as needed for moderate pain.      . Darunavir Ethanolate (PREZISTA) 800 MG tablet Take 800 mg by mouth daily with breakfast.      . emtricitabine-tenofovir (TRUVADA) 200-300 MG per tablet Take 1 tablet by mouth daily.      [name] ibuprofen (ADVIL,MOTRIN) 600 MG tablet Take 1 tablet (600 mg total) by mouth every 6 (six) hours as needed.  30 tablet  0  . methocarbamol (ROBAXIN) 500 MG tablet Take 1 tablet (500 mg total) by mouth 2 (two) times daily as needed for muscle spasms.  20 tablet  0  . Multiple Vitamin (MULTIVITAMIN WITH MINERALS) TABS Take 1 tablet by mouth daily.      . ritonavir (NORVIR) 100 MG capsule Take 100 mg by mouth daily with breakfast.       No current facility-administered medications on file prior to visit.   Active Ambulatory Problems    Diagnosis Date Noted  . Dysphagia 03/11/2012  . 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 Anemia of chronic disease 03/11/2012  . Reflux esophagitis 03/11/2012  . 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 HIV (human immunodeficiency virus infection) 03/16/2012  . Arthralgia 05/20/2012  . 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 Vaginal Discharge 05/30/2012  . Laceration of ankle, right 11/18/2012  . Chest pain 07/07/2013  . 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 Bell's palsy 08/26/2013   Resolved Ambulatory Problems    Diagnosis Date Noted  . Chest pain 03/11/2012  . Muscle strain of  chest wall 05/30/2013   Past Medical History  Diagnosis Date  . Tuberculosis   . Pelvic pain       Review of Systems 10 point ros is negative except for her baseline intermittent chest wall pain     Objective:   Physical Exam BP 118/72  Pulse 78  Temp(Src) 98.8 F (37.1 C) (Oral)  Wt 138 lb (62.596 kg)  Constitutional:  oriented to person, place, and time. appears well-developed and well-nourished. No distress.  HENT:  Mouth/Throat: Oropharynx is clear and moist. No oropharyngeal exudate.  Cardiovascular: Normal rate, regular rhythm and normal heart sounds. Exam reveals no gallop and no friction rub.  No murmur heard.  Pulmonary/Chest: Effort normal and breath sounds normal. No respiratory distress.  has no wheezes.  Abdominal: Soft. Bowel sounds are normal.  exhibits no distension. There is no tenderness.  Lymphadenopathy: no cervical adenopathy.  Neurological: alert and oriented to person, place, and time.  Skin: Skin is warm and dry. No rash noted. No erythema.  Psychiatric: a normal mood and affect. behavior is normal.      Assessment & Plan:  hiv = well controlled on current regimen. Continued to applaud her great adherence  Chest wall pain = msk in origin vs. 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 Continue with prn nsaids  Health maintenance = would like  to establish her in [facility] clinic or IM clinic where she would have more consistent providers. Perhaps, decrease on ED visits  rtc in 40months

## 2014-04-12 ENCOUNTER — Encounter: Payer: Self-pay | Admitting: *Deleted

## 2014-04-12 ENCOUNTER — Telehealth: Payer: Self-pay | Admitting: *Deleted

## 2014-04-12 NOTE — Telephone Encounter (Signed)
Patient called to give the fax number of her employer to send the letter about her chest x-ray for TB. She gave [phone] Attn: Helene Kelp. Advised her we will send it now.

## 2014-06-07 ENCOUNTER — Other Ambulatory Visit: Payer: Self-pay | Admitting: Internal Medicine

## 2014-06-19 ENCOUNTER — Other Ambulatory Visit: Payer: Medicaid Other

## 2014-06-26 ENCOUNTER — Other Ambulatory Visit: Payer: Medicaid Other

## 2014-06-26 DIAGNOSIS — B20 Human immunodeficiency virus [HIV] disease: Secondary | ICD-10-CM

## 2014-06-26 LAB — COMPREHENSIVE METABOLIC PANEL
ALT: 12 U/L (ref 0–35)
AST: 18 U/L (ref 0–37)
Albumin: 4.5 g/dL (ref 3.5–5.2)
Alkaline Phosphatase: 76 U/L (ref 39–117)
BUN: 9 mg/dL (ref 6–23)
CO2: 26 mEq/L (ref 19–32)
Calcium: 9.4 mg/dL (ref 8.4–10.5)
Chloride: 107 mEq/L (ref 96–112)
Creat: 0.79 mg/dL (ref 0.50–1.10)
Glucose, Bld: 74 mg/dL (ref 70–99)
Potassium: 4.3 mEq/L (ref 3.5–5.3)
Sodium: 140 mEq/L (ref 135–145)
Total Bilirubin: 0.3 mg/dL (ref 0.2–1.2)
Total Protein: 7.6 g/dL (ref 6.0–8.3)

## 2014-06-26 LAB — CBC WITH DIFFERENTIAL/PLATELET
Basophils Absolute: 0 10*3/uL (ref 0.0–0.1)
Basophils Relative: 1 % (ref 0–1)
Eosinophils Absolute: 0.3 10*3/uL (ref 0.0–0.7)
Eosinophils Relative: 6 % — ABNORMAL HIGH (ref 0–5)
HCT: 32.4 % — ABNORMAL LOW (ref 36.0–46.0)
Hemoglobin: 10.8 g/dL — ABNORMAL LOW (ref 12.0–15.0)
Lymphocytes Relative: 50 % — ABNORMAL HIGH (ref 12–46)
Lymphs Abs: 2.4 10*3/uL (ref 0.7–4.0)
MCH: 27.7 pg (ref 26.0–34.0)
MCHC: 33.3 g/dL (ref 30.0–36.0)
MCV: 83.1 fL (ref 78.0–100.0)
Monocytes Absolute: 0.5 10*3/uL (ref 0.1–1.0)
Monocytes Relative: 11 % (ref 3–12)
Neutro Abs: 1.5 10*3/uL — ABNORMAL LOW (ref 1.7–7.7)
Neutrophils Relative %: 32 % — ABNORMAL LOW (ref 43–77)
Platelets: 290 10*3/uL (ref 150–400)
RBC: 3.9 MIL/uL (ref 3.87–5.11)
RDW: 17.5 % — ABNORMAL HIGH (ref 11.5–15.5)
WBC: 4.7 10*3/uL (ref 4.0–10.5)

## 2014-06-28 LAB — T-HELPER CELL (CD4) - (RCID CLINIC ONLY)
CD4 % Helper T Cell: 24 % — ABNORMAL LOW (ref 33–55)
CD4 T Cell Abs: 600 /uL (ref 400–2700)

## 2014-06-28 LAB — HIV-1 RNA QUANT-NO REFLEX-BLD
HIV 1 RNA Quant: <20 copies/mL (ref <20)
HIV-1 RNA Quant, Log: <1.3 {Log} (ref <1.30)

## 2014-06-29 ENCOUNTER — Ambulatory Visit (INDEPENDENT_AMBULATORY_CARE_PROVIDER_SITE_OTHER): Payer: Medicaid Other | Admitting: *Deleted

## 2014-06-29 DIAGNOSIS — Z3009 Encounter for other general counseling and advice on contraception: Secondary | ICD-10-CM

## 2014-06-29 DIAGNOSIS — Z30018 Encounter for initial prescription of other contraceptives: Secondary | ICD-10-CM

## 2014-06-29 MED ORDER — MEDROXYPROGESTERONE ACETATE 150 MG/ML IM SUSP
150.0000 mg | Freq: Once | INTRAMUSCULAR | Status: AC
  Administered 2014-06-29: 150 mg via INTRAMUSCULAR

## 2014-06-29 NOTE — Progress Notes (Signed)
Pt stated that uterine cramping has decreased significantly.

## 2014-06-29 NOTE — Patient Instructions (Signed)
RTC for next injection between 09/14/14 and 09/28/14.

## 2014-07-06 ENCOUNTER — Encounter: Payer: Self-pay | Admitting: Internal Medicine

## 2014-07-06 ENCOUNTER — Ambulatory Visit (INDEPENDENT_AMBULATORY_CARE_PROVIDER_SITE_OTHER): Payer: Medicaid Other | Admitting: Internal Medicine

## 2014-07-06 VITALS — BP 115/73 | HR 96 | Temp 98.2°F | Wt 136.0 lb

## 2014-07-06 DIAGNOSIS — Z21 Asymptomatic human immunodeficiency virus [HIV] infection status: Secondary | ICD-10-CM

## 2014-07-06 MED ORDER — IBUPROFEN 600 MG PO TABS: 600.0000 mg | ORAL_TABLET | Freq: Four times a day (QID) | ORAL | Status: DC | PRN

## 2014-07-06 NOTE — Progress Notes (Signed)
   Subjective:    Patient ID: Kelsey Rodriguez, female    DOB: 28-Aug-1991, 23 y.o.   MRN: [id number]  HPI Kelsey Rodriguez is a 23yo F with HIV CD 4 count of 600/VL<20 doing excellent with truvada-DRVr. She reports missing 2 doses since her last meeting. She reports to be in good state of health except for  Intermittent chest wall discomfort at prior surgical site. She also c/o palpitations lasting 1-2 minutes  Lab Results  Component Value Date   CD4TCELL 24* 06/26/2014   CD4TABS 600 06/26/2014   Lab Results  Component Value Date   HIV1RNAQUANT <20 06/26/2014   Current Outpatient Prescriptions on File Prior to Visit  Medication Sig Dispense Refill  . emtricitabine-tenofovir (TRUVADA) 200-300 MG per tablet Take 1 tablet by mouth daily.      . Multiple Vitamin (MULTIVITAMIN WITH MINERALS) TABS Take 1 tablet by mouth daily.      . NORVIR 100 MG TABS tablet TAKE 1 TABLET BY MOUTH DAILY  30 tablet  3  . PREZISTA 800 MG tablet TAKE 1 TABLET BY MOUTH DAILY WITH BREAKFAST  30 tablet  3  . acetaminophen (TYLENOL) 500 MG tablet Take 500 mg by mouth every 6 (six) hours as needed for moderate pain.      . methocarbamol (ROBAXIN) 500 MG tablet Take 1 tablet (500 mg total) by mouth 2 (two) times daily as needed for muscle spasms.  20 tablet  0   No current facility-administered medications on file prior to visit.       Review of Systems +palpatations - intermittent and occ chest wall pain. 10 point ros is otherwise negative    Objective:   Physical Exam BP 115/73  Pulse 96  Temp(Src) 98.2 F (36.8 C) (Oral)  Wt 136 lb (61.689 kg) Physical Exam  Constitutional:  oriented to person, place, and time. appears well-developed and well-nourished. No distress.  HENT:  Mouth/Throat: Oropharynx is clear and moist. No oropharyngeal exudate.  Cardiovascular: Normal rate, regular rhythm and normal heart sounds. Exam reveals no gallop and no friction rub.  No murmur heard.  Pulmonary/Chest: Effort normal and breath  sounds normal. No respiratory distress.  has no wheezes.  Abdominal: Soft. Bowel sounds are normal.  exhibits no distension. There is no tenderness.  Lymphadenopathy: no cervical adenopathy.  Neurological: alert and oriented to person, place, and time.  Skin: Skin is warm and dry. No rash noted. No erythema.  Psychiatric: a normal mood and affect.  behavior is normal.      Assessment & Plan:  hiv = well controlled, applauded her adherence  Chest wall pain = msk related. Can tx with ibuprofen prn  Health maintenance = flu vaccine in the fall  Palpitations = will continue to follow. At this time no need fro further imaging.

## 2014-07-19 ENCOUNTER — Encounter: Payer: Self-pay | Admitting: Internal Medicine

## 2014-07-19 ENCOUNTER — Telehealth: Payer: Self-pay | Admitting: *Deleted

## 2014-07-19 ENCOUNTER — Ambulatory Visit (INDEPENDENT_AMBULATORY_CARE_PROVIDER_SITE_OTHER): Payer: Medicaid Other | Admitting: Internal Medicine

## 2014-07-19 VITALS — BP 117/77 | HR 102 | Temp 98.7°F | Ht 64.0 in | Wt 133.0 lb

## 2014-07-19 DIAGNOSIS — R519 Headache, unspecified: Secondary | ICD-10-CM

## 2014-07-19 DIAGNOSIS — R51 Headache: Secondary | ICD-10-CM

## 2014-07-19 DIAGNOSIS — B20 Human immunodeficiency virus [HIV] disease: Secondary | ICD-10-CM

## 2014-07-19 LAB — CBC WITH DIFFERENTIAL/PLATELET
Basophils Absolute: 0 10*3/uL (ref 0.0–0.1)
Basophils Relative: 0 % (ref 0–1)
Eosinophils Absolute: 0.2 10*3/uL (ref 0.0–0.7)
Eosinophils Relative: 4 % (ref 0–5)
HCT: 31.8 % — ABNORMAL LOW (ref 36.0–46.0)
Hemoglobin: 10.9 g/dL — ABNORMAL LOW (ref 12.0–15.0)
Lymphocytes Relative: 39 % (ref 12–46)
Lymphs Abs: 2.1 10*3/uL (ref 0.7–4.0)
MCH: 28.2 pg (ref 26.0–34.0)
MCHC: 34.3 g/dL (ref 30.0–36.0)
MCV: 82.4 fL (ref 78.0–100.0)
Monocytes Absolute: 0.5 10*3/uL (ref 0.1–1.0)
Monocytes Relative: 10 % (ref 3–12)
Neutro Abs: 2.5 10*3/uL (ref 1.7–7.7)
Neutrophils Relative %: 47 % (ref 43–77)
Platelets: 267 10*3/uL (ref 150–400)
RBC: 3.86 MIL/uL — ABNORMAL LOW (ref 3.87–5.11)
RDW: 16.7 % — ABNORMAL HIGH (ref 11.5–15.5)
WBC: 5.4 10*3/uL (ref 4.0–10.5)

## 2014-07-19 MED ORDER — AMITRIPTYLINE HCL 25 MG PO TABS: 25.0000 mg | ORAL_TABLET | Freq: Every day | ORAL | Status: DC

## 2014-07-19 NOTE — Progress Notes (Signed)
Patient ID: Kelsey Rodriguez, female   DOB: [birth year], 23 y.o.   MRN: [id number]       Patient ID: Kelsey Rodriguez, female   DOB: 17-Apr-1991, 23 y.o.   MRN: [id number]  HPI 23yo F with HIV, CD 4 count of 600/VL<20, on truvada/prezista/norvir. She complains of HA daily and feeling fatigued with chills, but not febrile when she has checked her temperature for the last 10 days. No sick contacts. No diarrhea/n/v. She occasionally takes ibuprofen for her headache.   Outpatient Encounter Prescriptions as of 07/19/2014  Medication Sig  . acetaminophen (TYLENOL) 500 MG tablet Take 500 mg by mouth every 6 (six) hours as needed for moderate pain.  [name] emtricitabine-tenofovir (TRUVADA) 200-300 MG per tablet Take 1 tablet by mouth daily.  [name] ibuprofen (ADVIL,MOTRIN) 600 MG tablet Take 1 tablet (600 mg total) by mouth every 6 (six) hours as needed.  . methocarbamol (ROBAXIN) 500 MG tablet Take 1 tablet (500 mg total) by mouth 2 (two) times daily as needed for muscle spasms.  . Multiple Vitamin (MULTIVITAMIN WITH MINERALS) TABS Take 1 tablet by mouth daily.  . NORVIR 100 MG TABS tablet TAKE 1 TABLET BY MOUTH DAILY  . PREZISTA 800 MG tablet TAKE 1 TABLET BY MOUTH DAILY WITH BREAKFAST     Patient Active Problem List   Diagnosis Date Noted  . Bell's palsy 08/26/2013  . Chest pain 07/07/2013  . 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 Laceration of ankle, right 11/18/2012  . 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 Vaginal Discharge 05/30/2012  . Arthralgia 05/20/2012  . HIV (human immunodeficiency virus infection) 03/16/2012  . Dysphagia 03/11/2012  . 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 Anemia of chronic disease 03/11/2012  . Reflux esophagitis 03/11/2012  . 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 Influenza Vaccine  07/15/2014     Review of Systems +ha, malaise, anorexia, chills. No nightsweats, no fever, + weight loss Physical Exam   BP 117/77  Pulse 102   Temp(Src) 98.7 F (37.1 C) (Oral)  Ht 5\' 4"  (1.626 m)  Wt 133 lb (60.328 kg)  BMI 22.82 kg/m2 Physical Exam  Constitutional:  oriented to person, place, and time. appears well-developed and well-nourished. No distress.  HENT:  Mouth/Throat: Oropharynx is clear and moist. No oropharyngeal exudate.  Cardiovascular: Normal rate, regular rhythm and normal heart sounds. Exam reveals no gallop and no friction rub.  No murmur heard.  Pulmonary/Chest: Effort normal and breath sounds normal. No respiratory distress.  has no wheezes.  Abdominal: Soft. Bowel sounds are normal.  exhibits no distension. There is no tenderness.  Lymphadenopathy: no cervical adenopathy.  Neurological: alert and oriented to person, place, and time.  Skin: Skin is warm and dry. No rash noted. No erythema.  Psychiatric: a normal mood and affect. behavior is normal.   Lab Results  Component Value Date   CD4TCELL 24* 06/26/2014   Lab Results  Component Value Date   CD4TABS 600 06/26/2014   CD4TABS 540 02/28/2014   CD4TABS 370* 12/20/2013   Lab Results  Component Value Date   HIV1RNAQUANT <20 06/26/2014   Lab Results  Component Value Date   HEPBSAB NEGATIVE 03/13/2012   No results found for this basename: RPR    CBC Lab Results  Component Value Date   WBC 4.7 06/26/2014   RBC 3.90 06/26/2014   HGB 10.8* 06/26/2014   HCT 32.4* 06/26/2014   PLT 290 06/26/2014   MCV 83.1  06/26/2014   MCH 27.7 06/26/2014   MCHC 33.3 06/26/2014   RDW 17.5* 06/26/2014   LYMPHSABS 2.4 06/26/2014   MONOABS 0.5 06/26/2014   EOSABS 0.3 06/26/2014   BASOSABS 0.0 06/26/2014   Lab Results  Component Value Date   WBC 5.4 07/19/2014   HGB 10.9* 07/19/2014   HCT 31.8* 07/19/2014   MCV 82.4 07/19/2014   PLT 267 07/19/2014    BMET Lab Results  Component Value Date   NA 140 06/26/2014   K 4.3 06/26/2014   CL 107 06/26/2014   CO2 26 06/26/2014   GLUCOSE 74 06/26/2014   BUN 9 06/26/2014   CREATININE 0.79 06/26/2014   CALCIUM 9.4 06/26/2014   GFRNONAA  >90 03/28/2014   GFRAA >90 03/28/2014     Assessment and Plan  Headaches = likely chronic. Will try low dose amiltryptline at 25mg   at bedtime  Malaise = wide differential, in setting of chills wondering if she is having viral infection. She does not appear ill/toxic on exam which is non-focal.  cbc with diff checked from today's clinic visit is similar to her previous cbc.  hiv = well controlled on her current regimen. Continue on truvada-DRVr

## 2014-07-19 NOTE — Telephone Encounter (Signed)
Patient called and advised she is having chills, headaches, and no energy. She denies fever or cough but states that she feels very bad and wants to be seen. Gave the patient an appt with her doctor for 230 pm today 07/19/14.

## 2014-07-28 ENCOUNTER — Telehealth: Payer: Self-pay | Admitting: Licensed Clinical Social Worker

## 2014-07-28 NOTE — Telephone Encounter (Signed)
Patient would like to know if Dr. Baxter Flattery could prescribe something to help her with her appetite. She is drinking ensure but she still doesn't have an appetite. She states she has been feeling like this since a few weeks.

## 2014-07-31 NOTE — Telephone Encounter (Signed)
Patient notified of appointment 08/07/14 at 10:00 AM.

## 2014-07-31 NOTE — Telephone Encounter (Signed)
Can you have her come to my clinic when i get back

## 2014-08-07 ENCOUNTER — Other Ambulatory Visit: Payer: Self-pay

## 2014-08-07 ENCOUNTER — Telehealth: Payer: Self-pay | Admitting: *Deleted

## 2014-08-07 ENCOUNTER — Encounter: Payer: Self-pay | Admitting: Internal Medicine

## 2014-08-07 ENCOUNTER — Ambulatory Visit (INDEPENDENT_AMBULATORY_CARE_PROVIDER_SITE_OTHER): Payer: Medicaid Other | Admitting: Internal Medicine

## 2014-08-07 VITALS — BP 124/78 | HR 69 | Temp 98.7°F | Wt 130.0 lb

## 2014-08-07 DIAGNOSIS — R5383 Other fatigue: Principal | ICD-10-CM

## 2014-08-07 DIAGNOSIS — B20 Human immunodeficiency virus [HIV] disease: Secondary | ICD-10-CM

## 2014-08-07 DIAGNOSIS — R630 Anorexia: Secondary | ICD-10-CM

## 2014-08-07 DIAGNOSIS — Z23 Encounter for immunization: Secondary | ICD-10-CM

## 2014-08-07 DIAGNOSIS — R5381 Other malaise: Secondary | ICD-10-CM

## 2014-08-07 DIAGNOSIS — Z21 Asymptomatic human immunodeficiency virus [HIV] infection status: Secondary | ICD-10-CM

## 2014-08-07 LAB — POCT URINE PREGNANCY: Preg Test, Ur: NEGATIVE

## 2014-08-07 LAB — CBC WITH DIFFERENTIAL/PLATELET
Basophils Absolute: 0 10*3/uL (ref 0.0–0.1)
Basophils Relative: 0 % (ref 0–1)
Eosinophils Absolute: 0.2 10*3/uL (ref 0.0–0.7)
Eosinophils Relative: 4 % (ref 0–5)
HCT: 35.2 % — ABNORMAL LOW (ref 36.0–46.0)
Hemoglobin: 11.6 g/dL — ABNORMAL LOW (ref 12.0–15.0)
Lymphocytes Relative: 46 % (ref 12–46)
Lymphs Abs: 2.1 10*3/uL (ref 0.7–4.0)
MCH: 28.6 pg (ref 26.0–34.0)
MCHC: 33 g/dL (ref 30.0–36.0)
MCV: 86.7 fL (ref 78.0–100.0)
Monocytes Absolute: 0.4 10*3/uL (ref 0.1–1.0)
Monocytes Relative: 8 % (ref 3–12)
Neutro Abs: 1.9 10*3/uL (ref 1.7–7.7)
Neutrophils Relative %: 42 % — ABNORMAL LOW (ref 43–77)
Platelets: 277 10*3/uL (ref 150–400)
RBC: 4.06 MIL/uL (ref 3.87–5.11)
RDW: 17.1 % — ABNORMAL HIGH (ref 11.5–15.5)
WBC: 4.6 10*3/uL (ref 4.0–10.5)

## 2014-08-07 MED ORDER — MEGESTROL ACETATE 625 MG/5ML PO SUSP: 625.0000 mg | Freq: Every day | ORAL | Status: DC

## 2014-08-07 NOTE — Telephone Encounter (Signed)
Originated PA for Megace ES with Medicaid.  PA# [id number].  Medicaid pharmacist is reviewing the PA.

## 2014-08-07 NOTE — Progress Notes (Signed)
Patient ID: Kelsey Rodriguez, female   DOB: June 13, 1991, 23 y.o.   MRN: [id number]       Patient ID: Kelsey Rodriguez, female   DOB: 13-Sep-1991, 23 y.o.   MRN: [id number]  HPI  Kelsey Rodriguez is a 23yo F with HIV, diagnosed in the setting of pulmTB. She reports that for hte past month having worsening malaise, decrease energy, loss of appetite, weight loss of #6 in 1 month, and chills. April her weight was 138, in late June 136 and today 130.  Outpatient Encounter Prescriptions as of 08/07/2014  Medication Sig  . acetaminophen (TYLENOL) 500 MG tablet Take 500 mg by mouth every 6 (six) hours as needed for moderate pain.  [name] amitriptyline (ELAVIL) 25 MG tablet Take 1 tablet (25 mg total) by mouth at bedtime. Start taking 1/2 tab at bedtime for 8 days then increase to full tab  . emtricitabine-tenofovir (TRUVADA) 200-300 MG per tablet Take 1 tablet by mouth daily.  [name] ibuprofen (ADVIL,MOTRIN) 600 MG tablet Take 1 tablet (600 mg total) by mouth every 6 (six) hours as needed.  . Multiple Vitamin (MULTIVITAMIN WITH MINERALS) TABS Take 1 tablet by mouth daily.  . NORVIR 100 MG TABS tablet TAKE 1 TABLET BY MOUTH DAILY  . PREZISTA 800 MG tablet TAKE 1 TABLET BY MOUTH DAILY WITH BREAKFAST  . methocarbamol (ROBAXIN) 500 MG tablet Take 1 tablet (500 mg total) by mouth 2 (two) times daily as needed for muscle spasms.     Patient Active Problem List   Diagnosis Date Noted  . Bell's palsy 08/26/2013  . Chest pain 07/07/2013  . 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 Laceration of ankle, right 11/18/2012  . 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 Vaginal Discharge 05/30/2012  . Arthralgia 05/20/2012  . HIV (human immunodeficiency virus infection) 03/16/2012  . Dysphagia 03/11/2012  . 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 Anemia of chronic disease 03/11/2012  . Reflux esophagitis 03/11/2012  . 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 Influenza Vaccine  07/15/2014     Review  of Systems 10 point ros is negative except for malaise, anorexia, weight loss and chills.  Physical Exam   BP 124/78  Pulse 69  Temp(Src) 98.7 F (37.1 C) (Oral)  Wt 130 lb (58.968 kg) Physical Exam  Constitutional:  oriented to person, place, and time. appears well-developed and well-nourished. No distress. Appears fatigue HENT: pale conjunctiva Mouth/Throat: Oropharynx is clear and moist. No oropharyngeal exudate.  Cardiovascular: Normal rate, regular rhythm and normal heart sounds. Exam reveals no gallop and no friction rub.  No murmur heard.  Pulmonary/Chest: Effort normal and breath sounds normal. No respiratory distress.  has no wheezes.  Abdominal: Soft. Bowel sounds are normal.  exhibits no distension. There is no tenderness.  Lymphadenopathy: no cervical adenopathy.  Neurological: alert and oriented to person, place, and time.  Skin: Skin is warm and dry. No rash noted. No erythema.  Psychiatric: a normal mood and affect. behavior is normal.    Lab Results  Component Value Date   CD4TCELL 24* 06/26/2014   Lab Results  Component Value Date   CD4TABS 600 06/26/2014   CD4TABS 540 02/28/2014   CD4TABS 370* 12/20/2013   Lab Results  Component Value Date   HIV1RNAQUANT <20 06/26/2014   Lab Results  Component Value Date   HEPBSAB NEGATIVE 03/13/2012   No results found for this basename: RPR    CBC Lab Results  Component Value Date  WBC 5.4 07/19/2014   RBC 3.86* 07/19/2014   HGB 10.9* 07/19/2014   HCT 31.8* 07/19/2014   PLT 267 07/19/2014   MCV 82.4 07/19/2014   MCH 28.2 07/19/2014   MCHC 34.3 07/19/2014   RDW 16.7* 07/19/2014   LYMPHSABS 2.1 07/19/2014   MONOABS 0.5 07/19/2014   EOSABS 0.2 07/19/2014   BASOSABS 0.0 07/19/2014   BMET Lab Results  Component Value Date   NA 140 06/26/2014   K 4.3 06/26/2014   CL 107 06/26/2014   CO2 26 06/26/2014   GLUCOSE 74 06/26/2014   BUN 9 06/26/2014   CREATININE 0.79 06/26/2014   CALCIUM 9.4 06/26/2014   GFRNONAA >90 03/28/2014   GFRAA >90  03/28/2014     Assessment and Plan  Weight loss/ malaise = concern that b symptoms are reflection of underlying problem such as malignancy or recurrent Tb, although less likley since she finished TB treatment 2 years ago. Her CD 4 count has been high enough to think that oi such as disseminated mac is less likely. Will check cbc with diff, cmp, and get chest/abd/pelvis CT  hiv = well controlled  Anorexia = will do a trial of megace  Health maintenance = will give flu vac

## 2014-08-08 ENCOUNTER — Ambulatory Visit: Payer: Medicaid Other

## 2014-08-08 LAB — COMPLETE METABOLIC PANEL WITH GFR
ALT: 10 U/L (ref 0–35)
AST: 17 U/L (ref 0–37)
Albumin: 4.7 g/dL (ref 3.5–5.2)
Alkaline Phosphatase: 79 U/L (ref 39–117)
BUN: 7 mg/dL (ref 6–23)
CO2: 25 mEq/L (ref 19–32)
Calcium: 9.6 mg/dL (ref 8.4–10.5)
Chloride: 104 mEq/L (ref 96–112)
Creat: 0.78 mg/dL (ref 0.50–1.10)
GFR, Est African American: >89 mL/min
GFR, Est Non African American: >89 mL/min
Glucose, Bld: 85 mg/dL (ref 70–99)
Potassium: 4.1 mEq/L (ref 3.5–5.3)
Sodium: 139 mEq/L (ref 135–145)
Total Bilirubin: 0.4 mg/dL (ref 0.2–1.2)
Total Protein: 7.8 g/dL (ref 6.0–8.3)

## 2014-08-09 ENCOUNTER — Ambulatory Visit: Payer: Medicaid Other

## 2014-08-14 NOTE — Telephone Encounter (Signed)
Megace approved through 08/02/15. Called patient and left her a voice mail with this information.

## 2014-08-22 ENCOUNTER — Ambulatory Visit: Payer: Medicaid Other | Admitting: Internal Medicine

## 2014-08-23 ENCOUNTER — Other Ambulatory Visit: Payer: Self-pay | Admitting: *Deleted

## 2014-08-23 DIAGNOSIS — B20 Human immunodeficiency virus [HIV] disease: Secondary | ICD-10-CM

## 2014-08-23 MED ORDER — EMTRICITABINE-TENOFOVIR DF 200-300 MG PO TABS: 1.0000 | ORAL_TABLET | Freq: Every day | ORAL | Status: DC

## 2014-08-23 MED ORDER — RITONAVIR 100 MG PO TABS: ORAL_TABLET | ORAL | Status: DC

## 2014-08-23 MED ORDER — DARUNAVIR ETHANOLATE 800 MG PO TABS: ORAL_TABLET | ORAL | Status: DC

## 2014-08-23 NOTE — Telephone Encounter (Signed)
ADAP Application 

## 2014-09-03 ENCOUNTER — Encounter (HOSPITAL_COMMUNITY): Payer: Self-pay | Admitting: Emergency Medicine

## 2014-09-03 ENCOUNTER — Emergency Department (HOSPITAL_COMMUNITY)
Admission: EM | Admit: 2014-09-03 | Discharge: 2014-09-03 | Disposition: A | Discharge status: Home / Self Care | Payer: Medicaid Other | Attending: Emergency Medicine | Admitting: Emergency Medicine

## 2014-09-03 DIAGNOSIS — Z8719 Personal history of other diseases of the digestive system: Secondary | ICD-10-CM | POA: Insufficient documentation

## 2014-09-03 DIAGNOSIS — Z8611 Personal history of tuberculosis: Secondary | ICD-10-CM | POA: Insufficient documentation

## 2014-09-03 DIAGNOSIS — Z8669 Personal history of other diseases of the nervous system and sense organs: Secondary | ICD-10-CM | POA: Insufficient documentation

## 2014-09-03 DIAGNOSIS — Z21 Asymptomatic human immunodeficiency virus [HIV] infection status: Secondary | ICD-10-CM | POA: Insufficient documentation

## 2014-09-03 DIAGNOSIS — Z87828 Personal history of other (healed) physical injury and trauma: Secondary | ICD-10-CM | POA: Insufficient documentation

## 2014-09-03 DIAGNOSIS — M25569 Pain in unspecified knee: Secondary | ICD-10-CM | POA: Insufficient documentation

## 2014-09-03 DIAGNOSIS — Z8619 Personal history of other infectious and parasitic diseases: Secondary | ICD-10-CM | POA: Insufficient documentation

## 2014-09-03 DIAGNOSIS — M2550 Pain in unspecified joint: Secondary | ICD-10-CM

## 2014-09-03 DIAGNOSIS — Z872 Personal history of diseases of the skin and subcutaneous tissue: Secondary | ICD-10-CM | POA: Insufficient documentation

## 2014-09-03 DIAGNOSIS — D638 Anemia in other chronic diseases classified elsewhere: Secondary | ICD-10-CM | POA: Insufficient documentation

## 2014-09-03 DIAGNOSIS — IMO0001 Reserved for inherently not codable concepts without codable children: Secondary | ICD-10-CM | POA: Insufficient documentation

## 2014-09-03 DIAGNOSIS — Z79899 Other long term (current) drug therapy: Secondary | ICD-10-CM | POA: Insufficient documentation

## 2014-09-03 DIAGNOSIS — M791 Myalgia, unspecified site: Secondary | ICD-10-CM

## 2014-09-03 LAB — COMPREHENSIVE METABOLIC PANEL
ALT: 20 U/L (ref 0–35)
AST: 21 U/L (ref 0–37)
Albumin: 3.4 g/dL — ABNORMAL LOW (ref 3.5–5.2)
Alkaline Phosphatase: 60 U/L (ref 39–117)
Anion gap: 13 (ref 5–15)
BUN: 16 mg/dL (ref 6–23)
CO2: 23 mEq/L (ref 19–32)
Calcium: 9.1 mg/dL (ref 8.4–10.5)
Chloride: 105 mEq/L (ref 96–112)
Creatinine, Ser: 0.76 mg/dL (ref 0.50–1.10)
GFR calc Af Amer: >90 mL/min (ref >90)
GFR calc non Af Amer: >90 mL/min (ref >90)
Glucose, Bld: 96 mg/dL (ref 70–99)
Potassium: 4.4 mEq/L (ref 3.7–5.3)
Sodium: 141 mEq/L (ref 137–147)
Total Bilirubin: <0.2 mg/dL — ABNORMAL LOW (ref 0.3–1.2)
Total Protein: 7.2 g/dL (ref 6.0–8.3)

## 2014-09-03 LAB — CK: Total CK: 118 U/L (ref 7–177)

## 2014-09-03 LAB — CBC
HCT: 33.6 % — ABNORMAL LOW (ref 36.0–46.0)
Hemoglobin: 10.8 g/dL — ABNORMAL LOW (ref 12.0–15.0)
MCH: 28.1 pg (ref 26.0–34.0)
MCHC: 32.1 g/dL (ref 30.0–36.0)
MCV: 87.3 fL (ref 78.0–100.0)
Platelets: 289 10*3/uL (ref 150–400)
RBC: 3.85 MIL/uL — ABNORMAL LOW (ref 3.87–5.11)
RDW: 16.7 % — ABNORMAL HIGH (ref 11.5–15.5)
WBC: 9.6 10*3/uL (ref 4.0–10.5)

## 2014-09-03 MED ORDER — HYDROCODONE-ACETAMINOPHEN 5-325 MG PO TABS
2.0000 | ORAL_TABLET | Freq: Once | ORAL | Status: AC
  Administered 2014-09-03: 2 via ORAL
  Filled 2014-09-03: qty 2

## 2014-09-03 MED ORDER — IBUPROFEN 400 MG PO TABS
400.0000 mg | ORAL_TABLET | Freq: Once | ORAL | Status: AC
  Administered 2014-09-03: 400 mg via ORAL
  Filled 2014-09-03: qty 1

## 2014-09-03 MED ORDER — HYDROCODONE-ACETAMINOPHEN 5-325 MG PO TABS: 1.0000 | ORAL_TABLET | Freq: Four times a day (QID) | ORAL | Status: DC | PRN

## 2014-09-03 NOTE — Discharge Instructions (Signed)
Take motrin as need for pain. You may also take hydrocodone as need for pain. No driving when taking hydrocodone. Also, do not take tylenol or acetaminophen containing medication when taking hydrocodone. Follow up with your doctor for recheck in the next 1-2 days. Return to ER if worse, new symptoms, fevers, leg swelling, focal joint pain or redness, other concern.  You were given pain medication in the ER - no driving for the next 4 hours     Arthralgia Your caregiver has diagnosed you as suffering from an arthralgia. Arthralgia means there is pain in a joint. This can come from many reasons including:  Bruising the joint which causes soreness (inflammation) in the joint.  Wear and tear on the joints which occur as we grow older (osteoarthritis).  Overusing the joint.  Various forms of arthritis.  Infections of the joint. Regardless of the cause of pain in your joint, most of these different pains respond to anti-inflammatory drugs and rest. The exception to this is when a joint is infected, and these cases are treated with antibiotics, if it is a bacterial infection. HOME CARE INSTRUCTIONS   Rest the injured area for as long as directed by your caregiver. Then slowly start using the joint as directed by your caregiver and as the pain allows. Crutches as directed may be useful if the ankles, knees or hips are involved. If the knee was splinted or casted, continue use and care as directed. If an stretchy or elastic wrapping bandage has been applied today, it should be removed and re-applied every 3 to 4 hours. It should not be applied tightly, but firmly enough to keep swelling down. Watch toes and feet for swelling, bluish discoloration, coldness, numbness or excessive pain. If any of these problems (symptoms) occur, remove the ace bandage and re-apply more loosely. If these symptoms persist, contact your caregiver or return to this location.  For the first 24 hours, keep the injured  extremity elevated on pillows while lying down.  Apply ice for 15-20 minutes to the sore joint every couple hours while awake for the first half day. Then 03-04 times per day for the first 48 hours. Put the ice in a plastic bag and place a towel between the bag of ice and your skin.  Wear any splinting, casting, elastic bandage applications, or slings as instructed.  Only take over-the-counter or prescription medicines for pain, discomfort, or fever as directed by your caregiver. Do not use aspirin immediately after the injury unless instructed by your physician. Aspirin can cause increased bleeding and bruising of the tissues.  If you were given crutches, continue to use them as instructed and do not resume weight bearing on the sore joint until instructed. Persistent pain and inability to use the sore joint as directed for more than 2 to 3 days are warning signs indicating that you should see a caregiver for a follow-up visit as soon as possible. Initially, a hairline fracture (break in bone) may not be evident on X-rays. Persistent pain and swelling indicate that further evaluation, non-weight bearing or use of the joint (use of crutches or slings as instructed), or further X-rays are indicated. X-rays may sometimes not show a small fracture until a week or 10 days later. Make a follow-up appointment with your own caregiver or one to whom we have referred you. A radiologist (specialist in reading X-rays) may read your X-rays. Make sure you know how you are to obtain your X-ray results. Do not assume everything  is normal if you do not hear from [place]. SEEK MEDICAL CARE IF: Bruising, swelling, or pain increases. SEEK IMMEDIATE MEDICAL CARE IF:   Your fingers or toes are numb or blue.  The pain is not responding to medications and continues to stay the same or get worse.  The pain in your joint becomes severe.  You develop a fever over 102 F (38.9 C).  It becomes impossible to move or use the  joint. MAKE SURE YOU:   Understand these instructions.  Will watch your condition.  Will get help right away if you are not doing well or get worse. Document Released: 12/01/2005 Document Revised: 02/23/2012 Document Reviewed: 07/19/2008 [facility] Patient Information 2015 [place], [place]. This information is not intended to replace advice given to you by your health care provider. Make sure you discuss any questions you have with your health care provider.    Muscle Pain Muscle pain (myalgia) may be caused by many things, including:  Overuse or muscle strain, especially if you are not in shape. This is the most common cause of muscle pain.  Injury.  Bruises.  Viruses, such as the flu.  Infectious diseases.  Fibromyalgia, which is a chronic condition that causes muscle tenderness, fatigue, and headache.  Autoimmune diseases, including lupus.  Certain drugs, including ACE inhibitors and statins. Muscle pain may be mild or severe. In most cases, the pain lasts only a short time and goes away without treatment. To diagnose the cause of your muscle pain, your health care provider will take your medical history. This means he or she will ask you when your muscle pain began and what has been happening. If you have not had muscle pain for very long, your health care provider may want to wait before doing much testing. If your muscle pain has lasted a long time, your health care provider may want to run tests right away. If your health care provider thinks your muscle pain may be caused by illness, you may need to have additional tests to rule out certain conditions.  Treatment for muscle pain depends on the cause. Home care is often enough to relieve muscle pain. Your health care provider may also prescribe anti-inflammatory medicine. HOME CARE INSTRUCTIONS Watch your condition for any changes. The following actions may help to lessen any discomfort you are feeling:  Only take  over-the-counter or prescription medicines as directed by your health care provider.  Apply ice to the sore muscle:  Put ice in a plastic bag.  Place a towel between your skin and the bag.  Leave the ice on for 15-20 minutes, 3-4 times a day.  You may alternate applying hot and cold packs to the muscle as directed by your health care provider.  If overuse is causing your muscle pain, slow down your activities until the pain goes away.  Remember that it is normal to feel some muscle pain after starting a workout program. Muscles that have not been used often will be sore at first.  Do regular, gentle exercises if you are not usually active.  Warm up before exercising to lower your risk of muscle pain.  Do not continue working out if the pain is very bad. Bad pain could mean you have injured a muscle. SEEK MEDICAL CARE IF:  Your muscle pain gets worse, and medicines do not help.  You have muscle pain that lasts longer than 3 days.  You have a rash or fever along with muscle pain.  You have muscle  pain after a tick bite.  You have muscle pain while working out, even though you are in good physical condition.  You have redness, soreness, or swelling along with muscle pain.  You have muscle pain after starting a new medicine or changing the dose of a medicine. SEEK IMMEDIATE MEDICAL CARE IF:  You have trouble breathing.  You have trouble swallowing.  You have muscle pain along with a stiff neck, fever, and vomiting.  You have severe muscle weakness or cannot move part of your body. MAKE SURE YOU:   Understand these instructions.  Will watch your condition.  Will get help right away if you are not doing well or get worse. Document Released: 10/23/2006 Document Revised: 12/06/2013 Document Reviewed: 09/27/2013 [facility] Patient Information 2015 [place], [place]. This information is not intended to replace advice given to you by your health care provider. Make sure you  discuss any questions you have with your health care provider.

## 2014-09-03 NOTE — ED Provider Notes (Signed)
CSN: [id number]     Arrival date & time 09/03/14  0725 History   First MD Initiated Contact with Patient 09/03/14 [phone]     Chief Complaint  Patient presents with  . Leg Pain  . Knee Pain     (Consider location/radiation/quality/duration/timing/severity/associated sxs/prior Treatment) Patient is a 23 y.o. female presenting with leg pain and knee pain. The history is provided by the patient.  Leg Pain Associated symptoms: no back pain, no fever and no neck pain   Knee Pain Associated symptoms: no back pain, no fever and no neck pain   pt with hx hiv, c/o bilateral knee, ankle and lower leg pain for the past 1-2 days. Pain dull, moderate, constant, although waxes and wanes in intensity. No leg swelling. No joint redness. No specific exacerbating or alleviating factors. No hx same. Pt denies any recent change in meds or new meds. No fever or chills. No skin changes, rash or erythema. No leg numbness/weakness. No claudication. Denies back pain or radicular pain. No change in gait or normal functional ability.     Past Medical History  Diagnosis Date  . Tuberculosis   . HIV (human immunodeficiency virus infection) 02/2012  . Pelvic pain   . 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 Bell's palsy 08/26/2013  . Anemia of chronic disease 03/11/2012  . 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 Laceration of ankle, right 11/18/2012  . Reflux esophagitis 03/11/2012  . 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 Esophagogastroduodenoscopy  03/11/2012    Procedure: ESOPHAGOGASTRODUODENOSCOPY (EGD);  Surgeon: Lafayette Dragon, MD;  Location: [facility] ENDOSCOPY;  Service: Endoscopy;  Laterality: N/A;  . Appendectomy  ~ 2000  . Lung biopsy  02/2012  . Dilation and curettage of uterus  2008  . Esophagogastroduodenoscopy N/A 03/07/2014    Procedure: ESOPHAGOGASTRODUODENOSCOPY (EGD);  Surgeon: Gatha Mayer, MD;  Location: [facility]  ENDOSCOPY;  Service: Endoscopy;  Laterality: N/A;   No family history on file. History  Substance Use Topics  . Smoking status: Never Smoker   . Smokeless tobacco: Never Used  . Alcohol Use: No     Comment: socially   OB History   Grav Para Term Preterm Abortions TAB SAB Ect Mult Living   1 0 0 0 1 0 1 0 0 0      Review of Systems  Constitutional: Negative for fever and chills.  HENT: Negative for sore throat.   Eyes: Negative for redness.  Respiratory: Negative for cough and shortness of breath.   Cardiovascular: Negative for chest pain and leg swelling.  Gastrointestinal: Negative for vomiting, abdominal pain, diarrhea and constipation.  Endocrine: Negative for polyuria.  Genitourinary: Negative for dysuria and flank pain.  Musculoskeletal: Positive for arthralgias and myalgias. Negative for back pain and neck pain.  Skin: Negative for rash and wound.  Neurological: Negative for weakness, numbness and headaches.  Hematological: Does not bruise/bleed easily.  Psychiatric/Behavioral: Negative for confusion.      Allergies  Review of patient's allergies indicates no known allergies.  Home Medications   Prior to Admission medications   Medication Sig Start Date End Date Taking? Authorizing Provider  acetaminophen (TYLENOL) 500 MG tablet Take 500 mg by mouth every 6 (six) hours as needed for moderate pain.    Historical Provider, MD  amitriptyline (ELAVIL) 25 MG tablet Take 1 tablet (25 mg total) by mouth at bedtime. Start taking 1/2 tab at bedtime for 8 days  then increase to full tab 07/19/14   Carlyle Basques, MD  Darunavir Ethanolate (PREZISTA) 800 MG tablet TAKE 1 TABLET BY MOUTH DAILY WITH BREAKFAST 08/23/14   Thayer Headings, MD  emtricitabine-tenofovir (TRUVADA) 200-300 MG per tablet Take 1 tablet by mouth daily. 08/23/14   Thayer Headings, MD  ibuprofen (ADVIL,MOTRIN) 600 MG tablet Take 1 tablet (600 mg total) by mouth every 6 (six) hours as needed. 07/06/14   Carlyle Basques, MD   megestrol (MEGACE ES) 625 MG/5ML suspension Take 5 mLs (625 mg total) by mouth daily. 08/07/14   Carlyle Basques, MD  methocarbamol (ROBAXIN) 500 MG tablet Take 1 tablet (500 mg total) by mouth 2 (two) times daily as needed for muscle spasms. 03/28/14   Julianne Rice, MD  Multiple Vitamin (MULTIVITAMIN WITH MINERALS) TABS Take 1 tablet by mouth daily.    Historical Provider, MD  ritonavir (NORVIR) 100 MG TABS tablet TAKE 1 TABLET BY MOUTH DAILY 08/23/14   Thayer Headings, MD   BP 119/77  Pulse 89  Temp(Src) 98.7 F (37.1 C) (Oral)  Resp 20  Ht 5\' 5"  (1.651 m)  Wt 130 lb (58.968 kg)  BMI 21.63 kg/m2  SpO2 100% Physical Exam  Nursing note and vitals reviewed. Constitutional: She is oriented to person, place, and time. She appears well-developed and well-nourished. No distress.  HENT:  Head: Atraumatic.  Mouth/Throat: Oropharynx is clear and moist.  Eyes: Conjunctivae are normal. No scleral icterus.  Neck: Neck supple. No tracheal deviation present.  Cardiovascular: Normal rate, regular rhythm, normal heart sounds and intact distal pulses.   Pulmonary/Chest: Effort normal and breath sounds normal. No respiratory distress.  Abdominal: Soft. Normal appearance and bowel sounds are normal. She exhibits no distension. There is no tenderness.  Genitourinary:  No cva tenderness  Musculoskeletal: She exhibits no edema.  tls spine non tender. Good rom bil hips, knees and ankles without pain. No joint effusion. No erythema. Distal pulses palp.   Neurological: She is alert and oriented to person, place, and time.  Motor intact bil lower ext, stre 5/5, sens intact.   Skin: Skin is warm and dry. No rash noted. She is not diaphoretic.  Psychiatric: She has a normal mood and affect.    ED Course  Procedures (including critical care time) Labs Review  Results for orders placed during the hospital encounter of 09/03/14  COMPREHENSIVE METABOLIC PANEL      Result Value Ref Range   Sodium 141  137 -  147 mEq/L   Potassium 4.4  3.7 - 5.3 mEq/L   Chloride 105  96 - 112 mEq/L   CO2 23  19 - 32 mEq/L   Glucose, Bld 96  70 - 99 mg/dL   BUN 16  6 - 23 mg/dL   Creatinine, Ser 0.76  0.50 - 1.10 mg/dL   Calcium 9.1  8.4 - 10.5 mg/dL   Total Protein 7.2  6.0 - 8.3 g/dL   Albumin 3.4 (*) 3.5 - 5.2 g/dL   AST 21  0 - 37 U/L   ALT 20  0 - 35 U/L   Alkaline Phosphatase 60  39 - 117 U/L   Total Bilirubin <0.2 (*) 0.3 - 1.2 mg/dL   GFR calc non Af Amer >90  >90 mL/min   GFR calc Af Amer >90  >90 mL/min   Anion gap 13  5 - 15  CBC      Result Value Ref Range   WBC 9.6  4.0 - 10.5  K/uL   RBC 3.85 (*) 3.87 - 5.11 MIL/uL   Hemoglobin 10.8 (*) 12.0 - 15.0 g/dL   HCT 33.6 (*) 36.0 - 46.0 %   MCV 87.3  78.0 - 100.0 fL   MCH 28.1  26.0 - 34.0 pg   MCHC 32.1  30.0 - 36.0 g/dL   RDW 16.7 (*) 11.5 - 15.5 %   Platelets 289  150 - 400 K/uL  CK      Result Value Ref Range   Total CK 118  7 - 177 U/L        MDM   Labs.  Motrin po. vicodin po.  Reviewed nursing notes and prior charts for additional history.   ?whether symptoms possible related to/side effect of her meds.  From labs, ck and lfts normal.   No focal muscular, bone, or joint tenderness on exam. Afeb.  Pt appears stable for d/c.     Mirna Mires, MD 09/03/14 [phone]

## 2014-09-03 NOTE — ED Notes (Signed)
Pt c/o pain to legs and knees x couple days. Pt reports pain increased last night. Pt denies recent injury. No swelling noted.

## 2014-09-04 ENCOUNTER — Telehealth: Payer: Self-pay | Admitting: *Deleted

## 2014-09-04 NOTE — Telephone Encounter (Signed)
Patient called reporting bilateral knee pain, states she went to the ED 9/20.  She stated she received hydrocodone last night, but that it was not working and she was up all night.  RN advised patient to continue the medications as prescribed by the ED and to treat the symptoms with heat/ice and staying off her feet.  If the pain does not get better, patient is advised to return to urgent care for evaluation. Landis Gandy, RN

## 2014-09-05 NOTE — Telephone Encounter (Signed)
Patient called back stating that she wanted a work note from Friday 9/18 and to return on 9/28. Patient states she is still sore all over and thinks it is from the Megace because she stopped it and feels a little better. Please advise.

## 2014-09-05 NOTE — Telephone Encounter (Signed)
Pt called again.  Feeling much better after stopping Megace.  Still requesting an excuse from work as noted above.  MD please respond.

## 2014-09-15 ENCOUNTER — Encounter (HOSPITAL_COMMUNITY): Payer: Self-pay | Admitting: Emergency Medicine

## 2014-09-15 ENCOUNTER — Emergency Department (HOSPITAL_COMMUNITY): Payer: Self-pay

## 2014-09-15 ENCOUNTER — Emergency Department (HOSPITAL_COMMUNITY): Payer: Medicaid Other

## 2014-09-15 ENCOUNTER — Emergency Department (HOSPITAL_COMMUNITY)
Admission: EM | Admit: 2014-09-15 | Discharge: 2014-09-15 | Disposition: A | Discharge status: Home / Self Care | Payer: Medicaid Other | Attending: Emergency Medicine | Admitting: Emergency Medicine

## 2014-09-15 DIAGNOSIS — M545 Low back pain: Secondary | ICD-10-CM | POA: Insufficient documentation

## 2014-09-15 DIAGNOSIS — Z3202 Encounter for pregnancy test, result negative: Secondary | ICD-10-CM | POA: Insufficient documentation

## 2014-09-15 DIAGNOSIS — R000 Tachycardia, unspecified: Secondary | ICD-10-CM | POA: Insufficient documentation

## 2014-09-15 DIAGNOSIS — Z79899 Other long term (current) drug therapy: Secondary | ICD-10-CM | POA: Insufficient documentation

## 2014-09-15 DIAGNOSIS — Z8611 Personal history of tuberculosis: Secondary | ICD-10-CM | POA: Insufficient documentation

## 2014-09-15 DIAGNOSIS — R0682 Tachypnea, not elsewhere classified: Secondary | ICD-10-CM | POA: Insufficient documentation

## 2014-09-15 DIAGNOSIS — M7989 Other specified soft tissue disorders: Secondary | ICD-10-CM

## 2014-09-15 DIAGNOSIS — Z8669 Personal history of other diseases of the nervous system and sense organs: Secondary | ICD-10-CM | POA: Insufficient documentation

## 2014-09-15 DIAGNOSIS — Z87828 Personal history of other (healed) physical injury and trauma: Secondary | ICD-10-CM | POA: Insufficient documentation

## 2014-09-15 DIAGNOSIS — Z862 Personal history of diseases of the blood and blood-forming organs and certain disorders involving the immune mechanism: Secondary | ICD-10-CM | POA: Insufficient documentation

## 2014-09-15 DIAGNOSIS — Z8719 Personal history of other diseases of the digestive system: Secondary | ICD-10-CM | POA: Insufficient documentation

## 2014-09-15 LAB — PREGNANCY, URINE: Preg Test, Ur: NEGATIVE

## 2014-09-15 LAB — COMPREHENSIVE METABOLIC PANEL
ALT: 18 U/L (ref 0–35)
AST: 24 U/L (ref 0–37)
Albumin: 3.4 g/dL — ABNORMAL LOW (ref 3.5–5.2)
Alkaline Phosphatase: 63 U/L (ref 39–117)
Anion gap: 13 (ref 5–15)
BUN: 10 mg/dL (ref 6–23)
CO2: 22 mEq/L (ref 19–32)
Calcium: 8.5 mg/dL (ref 8.4–10.5)
Chloride: 104 mEq/L (ref 96–112)
Creatinine, Ser: 0.74 mg/dL (ref 0.50–1.10)
GFR calc Af Amer: >90 mL/min (ref >90)
GFR calc non Af Amer: >90 mL/min (ref >90)
Glucose, Bld: 91 mg/dL (ref 70–99)
Potassium: 4.2 mEq/L (ref 3.7–5.3)
Sodium: 139 mEq/L (ref 137–147)
Total Bilirubin: <0.2 mg/dL — ABNORMAL LOW (ref 0.3–1.2)
Total Protein: 7.7 g/dL (ref 6.0–8.3)

## 2014-09-15 LAB — CBC WITH DIFFERENTIAL/PLATELET
Basophils Absolute: 0 10*3/uL (ref 0.0–0.1)
Basophils Relative: 0 % (ref 0–1)
Eosinophils Absolute: 0.1 10*3/uL (ref 0.0–0.7)
Eosinophils Relative: 1 % (ref 0–5)
HCT: 32.1 % — ABNORMAL LOW (ref 36.0–46.0)
Hemoglobin: 10.5 g/dL — ABNORMAL LOW (ref 12.0–15.0)
Lymphocytes Relative: 23 % (ref 12–46)
Lymphs Abs: 2.1 10*3/uL (ref 0.7–4.0)
MCH: 27.9 pg (ref 26.0–34.0)
MCHC: 32.7 g/dL (ref 30.0–36.0)
MCV: 85.4 fL (ref 78.0–100.0)
Monocytes Absolute: 0.8 10*3/uL (ref 0.1–1.0)
Monocytes Relative: 9 % (ref 3–12)
Neutro Abs: 6 10*3/uL (ref 1.7–7.7)
Neutrophils Relative %: 67 % (ref 43–77)
Platelets: 264 10*3/uL (ref 150–400)
RBC: 3.76 MIL/uL — ABNORMAL LOW (ref 3.87–5.11)
RDW: 16.7 % — ABNORMAL HIGH (ref 11.5–15.5)
WBC: 9 10*3/uL (ref 4.0–10.5)

## 2014-09-15 LAB — PRO B NATRIURETIC PEPTIDE: Pro B Natriuretic peptide (BNP): 135.7 pg/mL — ABNORMAL HIGH (ref 0–125)

## 2014-09-15 LAB — URINALYSIS, ROUTINE W REFLEX MICROSCOPIC
Bilirubin Urine: NEGATIVE
Glucose, UA: NEGATIVE mg/dL
Hgb urine dipstick: NEGATIVE
Ketones, ur: NEGATIVE mg/dL
Nitrite: NEGATIVE
Protein, ur: NEGATIVE mg/dL
Specific Gravity, Urine: 1.021 (ref 1.005–1.030)
Urobilinogen, UA: 0.2 mg/dL (ref 0.0–1.0)
pH: 7 (ref 5.0–8.0)

## 2014-09-15 LAB — CK: Total CK: 141 U/L (ref 7–177)

## 2014-09-15 LAB — URINE MICROSCOPIC-ADD ON

## 2014-09-15 LAB — I-STAT CG4 LACTIC ACID, ED: Lactic Acid, Venous: 2.17 mmol/L (ref 0.5–2.2)

## 2014-09-15 LAB — I-STAT TROPONIN, ED: Troponin i, poc: 0 ng/mL (ref 0.00–0.08)

## 2014-09-15 LAB — D-DIMER, QUANTITATIVE: D-Dimer, Quant: 0.77 ug/mL-FEU — ABNORMAL HIGH (ref 0.00–0.48)

## 2014-09-15 MED ORDER — IOHEXOL 350 MG/ML SOLN
100.0000 mL | Freq: Once | INTRAVENOUS | Status: AC | PRN
Start: 2014-09-15 — End: 2014-09-15
  Administered 2014-09-15: 100 mL via INTRAVENOUS

## 2014-09-15 MED ORDER — FUROSEMIDE 20 MG PO TABS: 20.0000 mg | ORAL_TABLET | Freq: Every day | ORAL | Status: DC

## 2014-09-15 NOTE — Discharge Instructions (Signed)
You MUST follow up with your doctor for recheck in the next week but please return to the ER if your symptoms worsen including your breathing or swelling.  Your tests today do not show any specific abnormalities.    Take the medication called Lasix once a day for the next 5 days to help with the swelling - it will cause you to urinate more frequently.  Please call your doctor for a followup appointment within 24-48 hours. When you talk to your doctor please let them know that you were seen in the emergency department and have them acquire all of your records so that they can discuss the findings with you and formulate a treatment plan to fully care for your new and ongoing problems.

## 2014-09-15 NOTE — ED Notes (Signed)
Pt reports facial swelling and leg swelling x 1 week. Denies SOB, but states last week when she was laying down she felt SOB. Pt also reports generalized body aches and chills. Pt in NAD. AO x 4.

## 2014-09-15 NOTE — ED Notes (Signed)
Lab results given to Dr. Sabra Heck of [place].

## 2014-09-15 NOTE — ED Provider Notes (Signed)
CSN: [id number]     Arrival date & time 09/15/14  1506 History   First MD Initiated Contact with Patient 09/15/14 1654     Chief Complaint  Patient presents with  . Generalized Body Aches  . Facial Swelling     (Consider location/radiation/quality/duration/timing/severity/associated sxs/prior Treatment) HPI Comments: 23 year old female, history of HIV, reports that her last CD4 count was over 600, she does take medications including Truvada and Prezista, and presents to the hospital approximately 2 weeks after the onset of some lower extremity aching. At that time the patient had a very benign presentation but states that over the last 2 weeks she has had persistent symptoms which have gradually worsened causing diffuse myalgias associated with significant low back pain when she stands and diffuse body swelling. Her legs are swollen to the point where she feels like she can no longer wear her shoes and notes swelling in her upper extremities her abdomen and her face. She has pain in her tongue and her gums which are sore but has been able to eat and drink and is making urine without difficulty. She reports some constipation but no blood in her stools and no diarrhea. There has been no vomiting, no fevers or chills. She does report feeling short of breath but denies coughing. His symptoms are progressive, nothing makes them better or worse.  The history is provided by the patient.    Past Medical History  Diagnosis Date  . Tuberculosis   . HIV (human immunodeficiency virus infection) 02/2012  . Pelvic pain   . 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 Bell's palsy 08/26/2013  . Anemia of chronic disease 03/11/2012  . 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 Laceration of ankle, right 11/18/2012  . Reflux esophagitis 03/11/2012  . 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 Esophagogastroduodenoscopy  03/11/2012     Procedure: ESOPHAGOGASTRODUODENOSCOPY (EGD);  Surgeon: Lafayette Dragon, MD;  Location: [facility] ENDOSCOPY;  Service: Endoscopy;  Laterality: N/A;  . Appendectomy  ~ 2000  . Lung biopsy  02/2012  . Dilation and curettage of uterus  2008  . Esophagogastroduodenoscopy N/A 03/07/2014    Procedure: ESOPHAGOGASTRODUODENOSCOPY (EGD);  Surgeon: Gatha Mayer, MD;  Location: [facility] ENDOSCOPY;  Service: Endoscopy;  Laterality: N/A;   No family history on file. History  Substance Use Topics  . Smoking status: Never Smoker   . Smokeless tobacco: Never Used  . Alcohol Use: No     Comment: socially   OB History   Grav Para Term Preterm Abortions TAB SAB Ect Mult Living   1 0 0 0 1 0 1 0 0 0      Review of Systems  All other systems reviewed and are negative.     Allergies  Review of patient's allergies indicates no known allergies.  Home Medications   Prior to Admission medications   Medication Sig Start Date End Date Taking? Authorizing Provider  BIOTIN PO Take 1 tablet by mouth daily.   Yes Historical Provider, MD  Darunavir Ethanolate (PREZISTA) 800 MG tablet Take 800 mg by mouth daily.   Yes Historical Provider, MD  emtricitabine-tenofovir (TRUVADA) 200-300 MG per tablet Take 1 tablet by mouth daily. 08/23/14  Yes Thayer Headings, MD  HYDROcodone-acetaminophen (NORCO/VICODIN) 5-325 MG per tablet Take 1-2 tablets by mouth every 6 (six) hours as needed for moderate pain. 09/03/14  Yes Mirna Mires, MD  ibuprofen (ADVIL,MOTRIN) 600 MG tablet  Take 600 mg by mouth every 6 (six) hours as needed for moderate pain.   Yes Historical Provider, MD  Multiple Vitamin (MULTIVITAMIN WITH MINERALS) TABS Take 1 tablet by mouth daily.   Yes Historical Provider, MD  ritonavir (NORVIR) 100 MG TABS tablet Take 100 mg by mouth daily.   Yes Historical Provider, MD  furosemide (LASIX) 20 MG tablet Take 1 tablet (20 mg total) by mouth daily. 09/15/14   Johnna Acosta, MD   BP 120/71  Pulse 91  Temp(Src) 98.8 F (37.1 C)  (Oral)  Resp 20  SpO2 100% Physical Exam  Nursing note and vitals reviewed. Constitutional: She appears well-developed and well-nourished.  HENT:  Head: Normocephalic and atraumatic.  Mouth/Throat: Oropharynx is clear and moist. No oropharyngeal exudate.  No lesions in the oral cavity, no swelling to the tongue, clear oropharynx, normal phonation, mucous membranes moist, no ulcerations of the gums, buccal mucosa or tongue  Eyes: Conjunctivae and EOM are normal. Pupils are equal, round, and reactive to light. Right eye exhibits no discharge. Left eye exhibits no discharge. No scleral icterus.  Neck: Normal range of motion. Neck supple. No JVD present. No thyromegaly present.  Cardiovascular: Regular rhythm, normal heart sounds and intact distal pulses.  Exam reveals no gallop and no friction rub.   No murmur heard. Tachycardic to 105  Pulmonary/Chest: Breath sounds normal. No respiratory distress. She has no wheezes. She has no rales.  Mild tachypnea, normal breath sounds  Abdominal: Soft. Bowel sounds are normal. She exhibits no distension and no mass. There is tenderness (mild diffuse tenderness, worse in the right midabdomen).  Musculoskeletal: Normal range of motion. She exhibits edema (slight pitting edema at the bilateral ankles). She exhibits no tenderness.  Bilateral lower extremity swelling, pitting at the ankles only  Lymphadenopathy:    She has no cervical adenopathy.  Neurological: She is alert. Coordination normal.  Skin: Skin is warm and dry. No rash noted. No erythema.  Psychiatric: She has a normal mood and affect. Her behavior is normal.    ED Course  Procedures (including critical care time) Labs Review Labs Reviewed  CBC WITH DIFFERENTIAL - Abnormal; Notable for the following:    RBC 3.76 (*)    Hemoglobin 10.5 (*)    HCT 32.1 (*)    RDW 16.7 (*)    All other components within normal limits  COMPREHENSIVE METABOLIC PANEL - Abnormal; Notable for the following:     Albumin 3.4 (*)    Total Bilirubin <0.2 (*)    All other components within normal limits  URINALYSIS, ROUTINE W REFLEX MICROSCOPIC - Abnormal; Notable for the following:    APPearance CLOUDY (*)    Leukocytes, UA SMALL (*)    All other components within normal limits  URINE MICROSCOPIC-ADD ON - Abnormal; Notable for the following:    Squamous Epithelial / LPF MANY (*)    Bacteria, UA MANY (*)    All other components within normal limits  PRO B NATRIURETIC PEPTIDE - Abnormal; Notable for the following:    Pro B Natriuretic peptide (BNP) 135.7 (*)    All other components within normal limits  D-DIMER, QUANTITATIVE - Abnormal; Notable for the following:    D-Dimer, Quant 0.77 (*)    All other components within normal limits  CK  PREGNANCY, URINE  I-STAT TROPOININ, ED  I-STAT CG4 LACTIC ACID, ED    Imaging Review Dg Chest 2 View  09/15/2014   CLINICAL DATA:  Body aches.  Facial swelling.  HIV.  Tuberculosis.  EXAM: CHEST  2 VIEW  COMPARISON:  03/07/2014; 07/08/2013  FINDINGS: Calcified pleural nodule projects between the left first and second ribs, unchanged and highly likely to be benign.  The lungs appear otherwise clear. Cardiac and mediastinal margins appear normal. No pleural effusion.  IMPRESSION: 1. No significant abnormality. Chronic calcified pleural lesion projects between the left first and second ribs.   Electronically Signed   By: Sherryl Barters M.D.   On: 09/15/2014 16:01   Ct Angio Chest Pe W/cm &/or Wo Cm  09/15/2014   CLINICAL DATA:  Acute shortness of breath and chest pressure. Diffuse body swelling. Tachycardia.  EXAM: CT ANGIOGRAPHY CHEST WITH CONTRAST  TECHNIQUE: Multidetector CT imaging of the chest was performed using the standard protocol during bolus administration of intravenous contrast. Multiplanar CT image reconstructions and MIPs were obtained to evaluate the vascular anatomy.  CONTRAST:  188mL OMNIPAQUE IOHEXOL 350 MG/ML SOLN  COMPARISON:  07/08/2013.   FINDINGS: Negative for pulmonary embolus. Prevascular soft tissue is again seen and presumably represents residual thymus. No pathologically enlarged mediastinal, hilar or axillary lymph nodes. Heart is at the upper limits of normal in size. No pericardial effusion.  Minimal dependent atelectasis bilaterally. Calcified pleural nodules in the left hemi thorax, as before. Subpleural ground-glass on the right middle lobe is likely infectious or inflammatory in etiology (series 406, image 43). No pleural fluid. Airway is unremarkable.  Incidental imaging of the upper abdomen shows an 8 mm low-attenuation lesion in the right hepatic lobe, too small to characterize but likely a cyst or hemangioma. Visualized portions of the adrenal glands, kidneys, spleen, pancreas, stomach and bowel are grossly unremarkable.  No worrisome lytic or sclerotic lesions.  Review of the MIP images confirms the above findings.  IMPRESSION: 1. Negative for pulmonary embolus. 2. No findings to explain the patient's given symptoms.   Electronically Signed   By: Lorin Picket M.D.   On: 09/15/2014 20:06     EKG Interpretation   Date/Time:  Friday September 15 2014 17:32:08 EDT Ventricular Rate:  106 PR Interval:  124 QRS Duration: 78 QT Interval:  326 QTC Calculation: 433 R Axis:   62 Text Interpretation:  Sinus tachycardia Low voltage, precordial leads  Since last tracing rate faster Confirmed by Jaydan Meidinger  MD, Emory Gallentine ([id number]) on  09/15/2014 5:43:31 PM      MDM   Final diagnoses:  Swelling of both lower extremities    The patient has new onset and worsening myalgias and swelling, laboratory workup was thus far unremarkable except for mild anemia, the urinalysis is very clear and shows no signs of infection and there is no liver dysfunction. We'll need to evaluate for a source of any shortness of breath though her x-ray is negative would also consider new-onset congestive heart failure though less likely. This could be medication  related as both of her HIV medications can cause shortness of breath as well as myalgias, back pain.  Pt has no acute findings on the CTA fo the chest to suggest PE and has no pneumonia.  VS improved and no more tachycardia, no hypoxia.  The findings were discussed with the patient who is in agreement with f/u.  I have also discussed the cause of the patients swelling with the infectious disease specialist on call, Dr. Johnnye Sima who does not believe that the HIV meds are causing the swelling, pt informed to continue meds until f/u.  Meds given in ED:  Medications  iohexol (OMNIPAQUE) 350 MG/ML  injection 100 mL (100 mLs Intravenous Contrast Given 09/15/14 1929)    New Prescriptions   FUROSEMIDE (LASIX) 20 MG TABLET    Take 1 tablet (20 mg total) by mouth daily.      Johnna Acosta, MD 09/15/14 2108

## 2014-09-15 NOTE — ED Notes (Signed)
Pt from home for eval of swelling to whole body x2 weeks, pt states she cannot stand for long periods of time due to swelling and pain. Denies any new medications, food. Pt also reports sob when laying flat, able to speak in complete sentences. Lung sounds clear. Pulses present. Pt denies no n/v/d/ or fever.

## 2014-09-19 ENCOUNTER — Other Ambulatory Visit: Payer: Medicaid Other

## 2014-09-21 ENCOUNTER — Telehealth: Payer: Self-pay | Admitting: *Deleted

## 2014-09-21 NOTE — Telephone Encounter (Signed)
Patient called and advised she is still having whole body swelling and tenderness. She advised she was sent home from work today due to shortness of breath and dizziness. She was weighted and today it was 165 lb (+30) which is up since her last visit in 09/03/2014 130 lb. She is worried and advised she went to the ED on 09/15/14 and was told the weight gain was due to her B20 medications. She is visibly swollen and wants to be seen by Dr Baxter Flattery. Advised her will have to call the doctor and see if she can be added to her schedule on Monday 09/25/14 and that I will call her back.

## 2014-09-22 ENCOUNTER — Other Ambulatory Visit: Payer: Self-pay | Admitting: Internal Medicine

## 2014-09-22 DIAGNOSIS — R635 Abnormal weight gain: Secondary | ICD-10-CM

## 2014-09-22 DIAGNOSIS — B20 Human immunodeficiency virus [HIV] disease: Secondary | ICD-10-CM

## 2014-09-22 NOTE — Telephone Encounter (Signed)
Can you add her onto schedule on Monday. May need to admit. This is unusual. Can you have her come in to do labs as well as get vitals?   Can you get cbc with diff, cmp, BNP (b natreutic peptide) ua, ,and cxr. i will place orders in.

## 2014-09-25 ENCOUNTER — Encounter: Payer: Self-pay | Admitting: Infectious Diseases

## 2014-09-25 ENCOUNTER — Ambulatory Visit (INDEPENDENT_AMBULATORY_CARE_PROVIDER_SITE_OTHER): Payer: Self-pay | Admitting: Infectious Diseases

## 2014-09-25 VITALS — BP 146/92 | HR 105 | Temp 99.4°F | Wt 163.0 lb

## 2014-09-25 DIAGNOSIS — B20 Human immunodeficiency virus [HIV] disease: Secondary | ICD-10-CM

## 2014-09-25 DIAGNOSIS — Z21 Asymptomatic human immunodeficiency virus [HIV] infection status: Secondary | ICD-10-CM

## 2014-09-25 DIAGNOSIS — R5383 Other fatigue: Secondary | ICD-10-CM | POA: Insufficient documentation

## 2014-09-25 NOTE — Assessment & Plan Note (Signed)
Will check cxr, plain films of spine, TTE to see if we can find etiology of her fatigue and sob/DOE. If these are unremarkable, will consider pulmonary clinic eval.

## 2014-09-25 NOTE — Assessment & Plan Note (Signed)
Will restart her meds when we can get her fatigue/doe improved.

## 2014-09-25 NOTE — Progress Notes (Signed)
   Subjective:    Patient ID: Kelsey Rodriguez, female    DOB: 1991/11/12, 23 y.o.   MRN: [id number]  HPI 23 yo F with hx of HIV+ (dx 2013) and pulmonary TB (2013). She developed pain in her LE (9-20). She was seen in ED (had CT angio -, CBC and CMP normal except mild anemia, mild decrease in Alb). She was started on hydrocodone in the ED. She then developed LE swelling. Was on TRV/DRVr since 2013.  Off ART currently for ~ 2 weeks. Swelling better over this time. Still having pain, mostly in legs and her lower back. Has had SOB, DOE. No cough. Has been out of work.     HIV 1 RNA Quant (copies/mL)  Date Value  06/26/2014 <20   02/28/2014 22*  12/20/2013 215*     CD4 T Cell Abs (/uL)  Date Value  06/26/2014 600   02/28/2014 540   12/20/2013 370*     Review of Systems  Constitutional: Negative for fever and chills.  Respiratory: Positive for shortness of breath. Negative for cough.   Cardiovascular: Positive for leg swelling.  Gastrointestinal: Negative for diarrhea and constipation.  Genitourinary: Negative for difficulty urinating.       Objective:   Physical Exam  Constitutional: She appears well-developed and well-nourished.  HENT:  Mouth/Throat: No oropharyngeal exudate.  Eyes: EOM are normal. Pupils are equal, round, and reactive to light.  Neck: Neck supple.  Cardiovascular: Normal rate, regular rhythm and normal heart sounds.   Pulmonary/Chest: She has wheezes.  Abdominal: Soft. Bowel sounds are normal. There is no tenderness.  Musculoskeletal:       Feet:  Lymphadenopathy:    She has no cervical adenopathy.          Assessment & Plan:

## 2014-09-26 ENCOUNTER — Ambulatory Visit (INDEPENDENT_AMBULATORY_CARE_PROVIDER_SITE_OTHER): Payer: Managed Care, Other (non HMO) | Admitting: Licensed Clinical Social Worker

## 2014-09-26 DIAGNOSIS — Z3002 Counseling and instruction in natural family planning to avoid pregnancy: Secondary | ICD-10-CM

## 2014-09-26 MED ORDER — MEDROXYPROGESTERONE ACETATE 150 MG/ML IM SUSP
150.0000 mg | Freq: Once | INTRAMUSCULAR | Status: AC
  Administered 2014-09-26: 150 mg via INTRAMUSCULAR

## 2014-09-26 NOTE — Telephone Encounter (Signed)
Patient saw Dr Johnnye Sima 09/25/14 and will follow up with Dr Baxter Flattery 10/05/14.

## 2014-09-27 ENCOUNTER — Ambulatory Visit (HOSPITAL_COMMUNITY)
Admission: RE | Admit: 2014-09-27 | Discharge: 2014-09-27 | Disposition: A | Discharge status: Home / Self Care | Payer: Managed Care, Other (non HMO) | Source: Ambulatory Visit | Attending: Internal Medicine | Admitting: Internal Medicine

## 2014-09-27 DIAGNOSIS — R635 Abnormal weight gain: Secondary | ICD-10-CM

## 2014-09-27 DIAGNOSIS — R0602 Shortness of breath: Secondary | ICD-10-CM | POA: Diagnosis not present

## 2014-09-27 DIAGNOSIS — R5383 Other fatigue: Secondary | ICD-10-CM | POA: Diagnosis not present

## 2014-09-27 DIAGNOSIS — M549 Dorsalgia, unspecified: Secondary | ICD-10-CM | POA: Diagnosis not present

## 2014-10-02 ENCOUNTER — Telehealth: Payer: Self-pay | Admitting: *Deleted

## 2014-10-02 NOTE — Telephone Encounter (Signed)
Truvada required PA.  Authorization given for 36 months (# [id number] ) through 10/02/2017. Notified [facility]. Patient had significant copays with her insurance.  RN activated copay assistance cards for Prezista, Truvada, Norvir on patient's behalf, relayed the information to [facility].  They ran the claims, all were valid. Left message for patient letting her know her medications would be ready. Landis Gandy, RN

## 2014-10-03 ENCOUNTER — Ambulatory Visit (HOSPITAL_COMMUNITY): Payer: Medicaid Other

## 2014-10-03 ENCOUNTER — Ambulatory Visit: Payer: Medicaid Other | Admitting: Internal Medicine

## 2014-10-03 ENCOUNTER — Telehealth (HOSPITAL_COMMUNITY): Payer: Self-pay | Admitting: Infectious Diseases

## 2014-10-05 ENCOUNTER — Encounter: Payer: Self-pay | Admitting: Internal Medicine

## 2014-10-05 ENCOUNTER — Ambulatory Visit (INDEPENDENT_AMBULATORY_CARE_PROVIDER_SITE_OTHER): Payer: Managed Care, Other (non HMO) | Admitting: Internal Medicine

## 2014-10-05 VITALS — BP 137/89 | HR 108 | Temp 97.2°F | Wt 165.0 lb

## 2014-10-05 DIAGNOSIS — R635 Abnormal weight gain: Secondary | ICD-10-CM

## 2014-10-05 DIAGNOSIS — E2740 Unspecified adrenocortical insufficiency: Secondary | ICD-10-CM

## 2014-10-05 LAB — BASIC METABOLIC PANEL
BUN: 13 mg/dL (ref 6–23)
CO2: 23 mEq/L (ref 19–32)
Calcium: 9.6 mg/dL (ref 8.4–10.5)
Chloride: 107 mEq/L (ref 96–112)
Creat: 0.82 mg/dL (ref 0.50–1.10)
Glucose, Bld: 71 mg/dL (ref 70–99)
Potassium: 4.5 mEq/L (ref 3.5–5.3)
Sodium: 140 mEq/L (ref 135–145)

## 2014-10-05 LAB — C-REACTIVE PROTEIN: CRP: <0.5 mg/dL (ref <0.60)

## 2014-10-05 NOTE — Progress Notes (Signed)
Patient ID: Kelsey Rodriguez, female   DOB: 1991-01-25, 23 y.o.   MRN: [id number]       Patient ID: Kelsey Rodriguez, female   DOB: Jul 17, 1991, 23 y.o.   MRN: [id number]  HPI 23 yo F originally from [place] diagnosed with HIV < 3 years in setting of pulmonary TB. She finished mTB treatment placed on antiretroviral therapy that was well controlled at CD 4 count of 600/VL<20 on truvada-boosted darunavir regimen. Last year, she had episode of viral meningitis as well as Bell's palsy. She had been doing well up until July when she started to have increasing fatigue, decrease appetite and weight loss. Clinic note in august showed weight of 130. In the last 4 wks, she reports increasing weight gain of #30, fatigue, shortness of breath and lower extremity swelling. She shows pictures of marked LLE. She went to ED who subscribed lasix. cxr did not show any abn suggestive of pna. She undergoes ctpa to rule pe which is negative. diurectics have helped modestly where legs are no longer edematous but she still feels like she has fullness to face and arms and torso. No new meds, recently had depo injection. She has stopped her HIV meds for the past 2-3 wk in order to determine why she is having this occur. Denies any other new meds. She had echo scheduled on 10/27. She states that she is on a break from working since she often felt dizzy while working, found to be tachycardic but unclear if BP was low  Outpatient Encounter Prescriptions as of 10/05/2014  Medication Sig  . BIOTIN PO Take 1 tablet by mouth daily.  . Darunavir Ethanolate (PREZISTA) 800 MG tablet Take 800 mg by mouth daily.  [name] emtricitabine-tenofovir (TRUVADA) 200-300 MG per tablet Take 1 tablet by mouth daily.  . furosemide (LASIX) 20 MG tablet Take 1 tablet (20 mg total) by mouth daily.  [name] HYDROcodone-acetaminophen (NORCO/VICODIN) 5-325 MG per tablet Take 1-2 tablets by mouth every 6 (six) hours as needed for moderate pain.  [name] ibuprofen (ADVIL,MOTRIN) 600 MG  tablet Take 600 mg by mouth every 6 (six) hours as needed for moderate pain.  . medroxyPROGESTERone (DEPO-PROVERA) 150 MG/ML injection Inject 150 mg into the muscle every 3 (three) months.  . Multiple Vitamin (MULTIVITAMIN WITH MINERALS) TABS Take 1 tablet by mouth daily.  . ritonavir (NORVIR) 100 MG TABS tablet Take 100 mg by mouth daily.     Patient Active Problem List   Diagnosis Date Noted  . Fatigue 09/25/2014  . Bell's palsy 08/26/2013  . Chest pain 07/07/2013  . 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 Laceration of ankle, right 11/18/2012  . 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 Vaginal Discharge 05/30/2012  . Arthralgia 05/20/2012  . HIV (human immunodeficiency virus infection) 03/16/2012  . Dysphagia 03/11/2012  . 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 Anemia of chronic disease 03/11/2012  . Reflux esophagitis 03/11/2012  . 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   Review of Systems + listed above Physical Exam   BP 137/89  Pulse 108  Temp(Src) 97.2 F (36.2 C) (Oral)  Wt 165 lb (74.844 kg) Physical Exam  Constitutional:  oriented to person, place, and time. appears well-developed and well-nourished. No distress.  HENT: facial fullness Mouth/Throat: Oropharynx is clear and moist. No oropharyngeal exudate.  Cardiovascular: Normal rate, regular rhythm and normal heart sounds. Exam reveals no gallop and no friction rub.  No murmur  heard.  Pulmonary/Chest: Effort normal and breath sounds normal. No respiratory distress.  has no wheezes.  Abdominal: Soft. Bowel sounds are normal.  exhibits no distension. There is no tenderness.  Lymphadenopathy: no cervical adenopathy.  Neurological: alert and oriented to person, place, and time.  Skin: Skin is warm and dry. No rash noted. No erythema.  Ext: no edema of lower extremities Psychiatric: a normal mood and affect. behavior is normal.   Lab Results    Component Value Date   CD4TCELL 24* 06/26/2014   Lab Results  Component Value Date   CD4TABS 600 06/26/2014   CD4TABS 540 02/28/2014   CD4TABS 370* 12/20/2013   Lab Results  Component Value Date   HIV1RNAQUANT <20 06/26/2014   Lab Results  Component Value Date   HEPBSAB NEGATIVE 03/13/2012   No results found for this basename: RPR    CBC Lab Results  Component Value Date   WBC 9.0 09/15/2014   RBC 3.76* 09/15/2014   HGB 10.5* 09/15/2014   HCT 32.1* 09/15/2014   PLT 264 09/15/2014   MCV 85.4 09/15/2014   MCH 27.9 09/15/2014   MCHC 32.7 09/15/2014   RDW 16.7* 09/15/2014   LYMPHSABS 2.1 09/15/2014   MONOABS 0.8 09/15/2014   EOSABS 0.1 09/15/2014   BASOSABS 0.0 09/15/2014   BMET Lab Results  Component Value Date   NA 139 09/15/2014   K 4.2 09/15/2014   CL 104 09/15/2014   CO2 22 09/15/2014   GLUCOSE 91 09/15/2014   BUN 10 09/15/2014   CREATININE 0.74 09/15/2014   CALCIUM 8.5 09/15/2014   GFRNONAA >90 09/15/2014   GFRAA >90 09/15/2014     Assessment and Plan  23yo F with previously well controlled HIV disease who reports 3 months of fatigue, malaise, and 4-6 increasing weight gain of #30 with DOE and shortness of breath. Clinical picture somewhat confusing. I would think if this is adrenal insufficiency that we would see other abnormalities or is this possibly new onset cardiomyopathy vs. Side effect from depo shot (could explain some weight gain but not other constellations of symptoms  - will refer to endocrine for evaluation / rule out of adrenal insufficiency - will get TTE on 10/27 to see if she is having cardiac dysfunction to expalin this presentation - for hiv, for now agree to hold on reinitiating hiv meds  rtc in 2-4 wk

## 2014-10-06 LAB — CORTISOL: Cortisol, Plasma: <0.5 ug/dL

## 2014-10-06 LAB — SEDIMENTATION RATE: Sed Rate: 47 mm/hr — ABNORMAL HIGH (ref 0–22)

## 2014-10-10 ENCOUNTER — Ambulatory Visit (HOSPITAL_COMMUNITY)
Admission: RE | Admit: 2014-10-10 | Discharge: 2014-10-10 | Disposition: A | Discharge status: Home / Self Care | Payer: Managed Care, Other (non HMO) | Source: Ambulatory Visit | Attending: Infectious Diseases | Admitting: Infectious Diseases

## 2014-10-10 DIAGNOSIS — R0602 Shortness of breath: Secondary | ICD-10-CM

## 2014-10-10 DIAGNOSIS — R5383 Other fatigue: Secondary | ICD-10-CM

## 2014-10-10 DIAGNOSIS — R5382 Chronic fatigue, unspecified: Secondary | ICD-10-CM

## 2014-10-10 NOTE — Progress Notes (Signed)
  Echocardiogram 2D Echocardiogram has been performed.  Darlina Sicilian M 10/10/2014, 1:58 PM

## 2014-10-16 ENCOUNTER — Other Ambulatory Visit: Payer: Self-pay | Admitting: *Deleted

## 2014-10-16 ENCOUNTER — Encounter: Payer: Self-pay | Admitting: Internal Medicine

## 2014-10-16 ENCOUNTER — Telehealth: Payer: Self-pay | Admitting: *Deleted

## 2014-10-16 DIAGNOSIS — E2740 Unspecified adrenocortical insufficiency: Secondary | ICD-10-CM

## 2014-10-16 DIAGNOSIS — R635 Abnormal weight gain: Secondary | ICD-10-CM

## 2014-10-16 NOTE — Telephone Encounter (Signed)
Called the patient and advised her scheduled an appt for her to have her CT Abdomen 10/20/14 at 9 am at [facility]. Advised her to go sometime this week prior to Thursday to pick up the contrast she is to drink before she can have the CT done. Also gave her the number to cancel if something comes up and she can not make the appt.  [facility] Scheduling

## 2014-10-20 ENCOUNTER — Ambulatory Visit (HOSPITAL_COMMUNITY)
Admission: RE | Admit: 2014-10-20 | Discharge: 2014-10-20 | Disposition: A | Discharge status: Home / Self Care | Payer: Managed Care, Other (non HMO) | Source: Ambulatory Visit | Attending: Internal Medicine | Admitting: Internal Medicine

## 2014-10-20 ENCOUNTER — Other Ambulatory Visit: Payer: Self-pay | Admitting: Internal Medicine

## 2014-10-20 DIAGNOSIS — R5383 Other fatigue: Secondary | ICD-10-CM | POA: Insufficient documentation

## 2014-10-20 DIAGNOSIS — R5381 Other malaise: Secondary | ICD-10-CM | POA: Insufficient documentation

## 2014-10-20 DIAGNOSIS — R634 Abnormal weight loss: Secondary | ICD-10-CM | POA: Diagnosis not present

## 2014-10-20 DIAGNOSIS — R6883 Chills (without fever): Secondary | ICD-10-CM | POA: Insufficient documentation

## 2014-10-20 MED ORDER — IOHEXOL 300 MG/ML  SOLN
100.0000 mL | Freq: Once | INTRAMUSCULAR | Status: AC | PRN
  Administered 2014-10-20: 100 mL via INTRAVENOUS

## 2014-10-24 ENCOUNTER — Ambulatory Visit (INDEPENDENT_AMBULATORY_CARE_PROVIDER_SITE_OTHER): Payer: Managed Care, Other (non HMO) | Admitting: Internal Medicine

## 2014-10-24 VITALS — BP 120/79 | HR 94 | Temp 98.2°F | Wt 163.0 lb

## 2014-10-24 DIAGNOSIS — B20 Human immunodeficiency virus [HIV] disease: Secondary | ICD-10-CM

## 2014-10-24 DIAGNOSIS — M5440 Lumbago with sciatica, unspecified side: Secondary | ICD-10-CM

## 2014-10-24 LAB — CBC WITH DIFFERENTIAL/PLATELET
Basophils Absolute: 0 10*3/uL (ref 0.0–0.1)
Basophils Relative: 1 % (ref 0–1)
Eosinophils Absolute: 0.3 10*3/uL (ref 0.0–0.7)
Eosinophils Relative: 7 % — ABNORMAL HIGH (ref 0–5)
HCT: 32.3 % — ABNORMAL LOW (ref 36.0–46.0)
Hemoglobin: 11.1 g/dL — ABNORMAL LOW (ref 12.0–15.0)
Lymphocytes Relative: 46 % (ref 12–46)
Lymphs Abs: 2.2 10*3/uL (ref 0.7–4.0)
MCH: 26.8 pg (ref 26.0–34.0)
MCHC: 34.4 g/dL (ref 30.0–36.0)
MCV: 78 fL (ref 78.0–100.0)
Monocytes Absolute: 0.6 10*3/uL (ref 0.1–1.0)
Monocytes Relative: 12 % (ref 3–12)
Neutro Abs: 1.6 10*3/uL — ABNORMAL LOW (ref 1.7–7.7)
Neutrophils Relative %: 34 % — ABNORMAL LOW (ref 43–77)
Platelets: 333 10*3/uL (ref 150–400)
RBC: 4.14 MIL/uL (ref 3.87–5.11)
RDW: 16.5 % — ABNORMAL HIGH (ref 11.5–15.5)
WBC: 4.7 10*3/uL (ref 4.0–10.5)

## 2014-10-24 LAB — T4, FREE: Free T4: 0.83 ng/dL (ref 0.80–1.80)

## 2014-10-24 LAB — TSH: TSH: 0.529 u[IU]/mL (ref 0.350–4.500)

## 2014-10-24 NOTE — Progress Notes (Signed)
Patient ID: Kelsey Rodriguez, female   DOB: [birth year], 23 y.o.   MRN: [id number]       Patient ID: Kelsey Rodriguez, female   DOB: 07/17/1991, 23 y.o.   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 CD 4 count 600/VL<20, in July 2015 on truvada/boosted darunavir. Her HIV regimen had been temporarily held in order to determine etiology of recent episode of possible pulmonary edema, fatigue, lower extremity swelling and weight gain. She has had 30-35# weight gain from august 2015 to Oct 2015. No new weight gain since last visit 4 wks ago. Now new medications. No longer having any lower extremity swelling, only had lasix during original ED visit in September. She had TTE recently which was normal to eliminate heart failure etiology. CT of chest/abd/pelvis only showed residual scarring from prior pulmonary TB treatment. She has restarted HIV meds without difficulty.  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 Has muscle skeletal side pain, bilateral flanks, no fever, chills, dysuria, nor diarrhea, n/v/anorexia. Does have episodic constipation. No other joint pain of shoulders, hips, hands, wrist or ankles  Outpatient Encounter Prescriptions as of 10/24/2014  Medication Sig  . BIOTIN PO Take 1 tablet by mouth daily.  . Darunavir Ethanolate (PREZISTA) 800 MG tablet Take 800 mg by mouth daily.  [name] emtricitabine-tenofovir (TRUVADA) 200-300 MG per tablet Take 1 tablet by mouth daily.  . furosemide (LASIX) 20 MG tablet Take 1 tablet (20 mg total) by mouth daily.  [name] HYDROcodone-acetaminophen (NORCO/VICODIN) 5-325 MG per tablet Take 1-2 tablets by mouth every 6 (six) hours as needed for moderate pain.  [name] ibuprofen (ADVIL,MOTRIN) 600 MG tablet Take 600 mg by mouth every 6 (six) hours as needed for moderate pain.  . medroxyPROGESTERone (DEPO-PROVERA) 150 MG/ML injection Inject 150 mg into the muscle every 3  (three) months.  . Multiple Vitamin (MULTIVITAMIN WITH MINERALS) TABS Take 1 tablet by mouth daily.  . ritonavir (NORVIR) 100 MG TABS tablet Take 100 mg by mouth daily.     Patient Active Problem List   Diagnosis Date Noted  . Fatigue 09/25/2014  . Bell's palsy 08/26/2013  . Chest pain 07/07/2013  . 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 Laceration of ankle, right 11/18/2012  . 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 Vaginal Discharge 05/30/2012  . Arthralgia 05/20/2012  . HIV (human immunodeficiency virus infection) 03/16/2012  . Dysphagia 03/11/2012  . 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 Anemia of chronic disease 03/11/2012  . Reflux esophagitis 03/11/2012  . 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   Review of Systems See above section  Physical Exam   BP 120/79 mmHg  Pulse 94  Temp(Src) 98.2 F (36.8 C) (Oral)  Wt 163 lb (73.936 kg)  Constitutional:  oriented to person, place, and time. appears well-developed and well-nourished. No distress. Rounded face (from recent weight gain) HENT:  Mouth/Throat: Oropharynx is clear and moist. No oropharyngeal exudate.  Cardiovascular: Normal rate, regular rhythm and normal heart sounds. Exam reveals no gallop and no friction rub.  No murmur heard.  Pulmonary/Chest: Effort normal and breath sounds normal. No respiratory distress.  has no wheezes.  Abdominal: Soft. Bowel sounds are normal.  exhibits no distension. There is no tenderness.  Back ; no pain along spinous process Lymphadenopathy: no cervical adenopathy.  Neurological: alert and oriented to person, place, and time.  Skin: Skin is warm and dry. No rash noted. No erythema.  Psychiatric: a normal mood and affect. behavior is normal.   Lab Results  Component Value Date   CD4TCELL 24* 06/26/2014   Lab Results  Component Value Date   CD4TABS 600 06/26/2014   CD4TABS 540 02/28/2014   CD4TABS  370* 12/20/2013   Lab Results  Component Value Date   HIV1RNAQUANT <20 06/26/2014   Lab Results  Component Value Date   HEPBSAB NEGATIVE 03/13/2012   No results found for: RPR  CBC Lab Results  Component Value Date   WBC 9.0 09/15/2014   RBC 3.76* 09/15/2014   HGB 10.5* 09/15/2014   HCT 32.1* 09/15/2014   PLT 264 09/15/2014   MCV 85.4 09/15/2014   MCH 27.9 09/15/2014   MCHC 32.7 09/15/2014   RDW 16.7* 09/15/2014   LYMPHSABS 2.1 09/15/2014   MONOABS 0.8 09/15/2014   EOSABS 0.1 09/15/2014   BASOSABS 0.0 09/15/2014   BMET Lab Results  Component Value Date   NA 140 10/05/2014   K 4.5 10/05/2014   CL 107 10/05/2014   CO2 23 10/05/2014   GLUCOSE 71 10/05/2014   BUN 13 10/05/2014   CREATININE 0.82 10/05/2014   CALCIUM 9.6 10/05/2014   GFRNONAA >90 09/15/2014   GFRAA >90 09/15/2014     Assessment and Plan  hiv = will check cd 4 coutn and viral load, roughly restarted hiv meds 3 wks ago. Continue on current regimen  Weight gain, fatigue = somewhat perplexing since this occurred subacutely over 3 months with significant weight gain but also had lower extremity edema, shortness of breath. Treated symptomatically with lasix and now pulmonary symptoms improved, but still has 30# since august. we will have patient be seen by endocrinology to see if possibly adrenal insufficiency. Will check thyroid function test today.   If work up is inconclusive, possibly wonder if related to drug side effect, possibly due to depo shot she has been receiving. Will change to different birth contrfibromyalgiaol agent  Back pain/MSK pain = plan films in October did not show any pathology. Recommend to get physical therapy to see if it improves her symptoms.  i question she has element of fibromyalgia , she might benefit from SSRI. Will see if anything comes of work up with endocrinology before thinking fibromyalgia. Will check sed rate and crp.  Constipation = will recommend miralax to go  everyday  Insurance = will likely need adap since cost prohibitive for her through work

## 2014-10-25 LAB — C-REACTIVE PROTEIN: CRP: <0.5 mg/dL (ref <0.60)

## 2014-10-25 LAB — COMPLETE METABOLIC PANEL WITH GFR
ALT: 10 U/L (ref 0–35)
AST: 17 U/L (ref 0–37)
Albumin: 4.3 g/dL (ref 3.5–5.2)
Alkaline Phosphatase: 84 U/L (ref 39–117)
BUN: 7 mg/dL (ref 6–23)
CO2: 21 mEq/L (ref 19–32)
Calcium: 9.6 mg/dL (ref 8.4–10.5)
Chloride: 104 mEq/L (ref 96–112)
Creat: 0.79 mg/dL (ref 0.50–1.10)
GFR, Est African American: >89 mL/min
GFR, Est Non African American: >89 mL/min
Glucose, Bld: 71 mg/dL (ref 70–99)
Potassium: 4.4 mEq/L (ref 3.5–5.3)
Sodium: 136 mEq/L (ref 135–145)
Total Bilirubin: 0.3 mg/dL (ref 0.2–1.2)
Total Protein: 7.6 g/dL (ref 6.0–8.3)

## 2014-10-25 LAB — HIV-1 RNA QUANT-NO REFLEX-BLD
HIV 1 RNA Quant: 45 copies/mL — ABNORMAL HIGH (ref <20)
HIV-1 RNA Quant, Log: 1.65 {Log} — ABNORMAL HIGH (ref <1.30)

## 2014-10-25 LAB — SEDIMENTATION RATE: Sed Rate: 34 mm/hr — ABNORMAL HIGH (ref 0–22)

## 2014-10-26 ENCOUNTER — Other Ambulatory Visit: Payer: Self-pay | Admitting: Internal Medicine

## 2014-10-26 LAB — T-HELPER CELL (CD4) - (RCID CLINIC ONLY)
CD4 % Helper T Cell: 26 % — ABNORMAL LOW (ref 33–55)
CD4 T Cell Abs: 610 /uL (ref 400–2700)

## 2014-10-27 ENCOUNTER — Encounter: Payer: Self-pay | Admitting: Internal Medicine

## 2014-10-27 ENCOUNTER — Ambulatory Visit (INDEPENDENT_AMBULATORY_CARE_PROVIDER_SITE_OTHER): Payer: Managed Care, Other (non HMO) | Admitting: Internal Medicine

## 2014-10-27 ENCOUNTER — Other Ambulatory Visit: Payer: Self-pay | Admitting: Internal Medicine

## 2014-10-27 ENCOUNTER — Other Ambulatory Visit: Payer: Self-pay | Admitting: *Deleted

## 2014-10-27 VITALS — HR 94 | Temp 99.1°F | Resp 12 | Ht 63.0 in | Wt 161.4 lb

## 2014-10-27 DIAGNOSIS — B20 Human immunodeficiency virus [HIV] disease: Secondary | ICD-10-CM

## 2014-10-27 DIAGNOSIS — R52 Pain, unspecified: Secondary | ICD-10-CM

## 2014-10-27 DIAGNOSIS — R5383 Other fatigue: Secondary | ICD-10-CM

## 2014-10-27 MED ORDER — DARUNAVIR ETHANOLATE 800 MG PO TABS: 800.0000 mg | ORAL_TABLET | Freq: Every day | ORAL | Status: DC

## 2014-10-27 MED ORDER — RITONAVIR 100 MG PO TABS: 100.0000 mg | ORAL_TABLET | Freq: Every day | ORAL | Status: DC

## 2014-10-27 NOTE — Progress Notes (Addendum)
Patient ID: Kelsey Rodriguez, female   DOB: 1991/04/18, 23 y.o.   MRN: [id number]   HPI  Kelsey Rodriguez is a 23 y.o.-year-old female, referred by Dr. Baxter Flattery (ID), in consultation for possible adrenal insufficiency.  Pt eveloped a lot of pain in knees and ankles 2 mo ago >> went to the ED 1x>> started Hydrocodone >> sent home >> swelling (gained 30 lbs in 1 mo) >> went to the ED again >> started on diuretic >> sent home >> she did not urinate more >> weight still high but improved, but she feels the edema is better.  She describes AP and constipation at the time when she had the swelling. She also had a sore tongue.She denies N/V.  She had meningitis last year 09/2013 >> HAs since then.  She described no appetite for 3 mo prior to this episode >> lost weight 140 >> 130 lbs. No N/V.  Denies h/o prednisone or other steroids, but per review of the chart, she had a Dexamethasone inj in 08/2013, Toradol inj before a lung Bx 03/2012.  She is on Provera since 09/2013, last inj 09/26/2014.  She had a lung Bx >> positive for TB >> tx for 9 months  Reviewed pertinent labs - cortisol undetectable at 12:13 pm!: Component     Latest Ref Rng 10/05/2014  Cortisol, Plasma      <0.5   Pt c/o: - + weight gain - + more fatigue after the last ED visit - + back and leg pain. She feels the pain causes the fatigue. - + constipation - new - no dry skin - no hair falling - + cold intolerance - new - no anxiety/ no depression  I reviewed her chart and she also has a history of HIV, lung TB  ROS: Constitutional: + weight gain, + fatigue, + subjective hypothermia Eyes: no blurry vision, no xerophthalmia ENT: no sore throat, no nodules palpated in throat, no dysphagia/odynophagia, no hoarseness Cardiovascular: + CP - positional and with deep breaths/+ SOB/no palpitations/+ leg swelling Respiratory: no cough/+ SOB Gastrointestinal: no N/V/D/C Musculoskeletal: + both: muscle/joint aches Skin: no  rashes Neurological: no tremors/numbness/tingling/dizziness, +_ HA Psychiatric: no depression/anxiety  Past Medical History  Diagnosis Date  . Tuberculosis   . HIV (human immunodeficiency virus infection) 02/2012  . Pelvic pain   . 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 Bell's palsy 08/26/2013  . Anemia of chronic disease 03/11/2012  . 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 Laceration of ankle, right 11/18/2012  . Reflux esophagitis 03/11/2012  . 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 Esophagogastroduodenoscopy  03/11/2012    Procedure: ESOPHAGOGASTRODUODENOSCOPY (EGD);  Surgeon: Lafayette Dragon, MD;  Location: [facility] ENDOSCOPY;  Service: Endoscopy;  Laterality: N/A;  . Appendectomy  ~ 2000  . Lung biopsy  02/2012  . Dilation and curettage of uterus  2008  . Esophagogastroduodenoscopy N/A 03/07/2014    Procedure: ESOPHAGOGASTRODUODENOSCOPY (EGD);  Surgeon: Gatha Mayer, MD;  Location: [facility] ENDOSCOPY;  Service: Endoscopy;  Laterality: N/A;   History   Social History  . Marital Status: Single    Spouse Name: N/A    Number of Children: 0   Occupational History  . CNA   Social History Main Topics  . Smoking status: Never Smoker   . Smokeless tobacco: Never Used  . Alcohol Use: No     Comment: socially  . Drug Use: No  . Sexual Activity:  Partners: Male   Current Outpatient Prescriptions on File Prior to Visit  Medication Sig Dispense Refill  . BIOTIN PO Take 1 tablet by mouth daily.    [name] emtricitabine-tenofovir (TRUVADA) 200-300 MG per tablet Take 1 tablet by mouth daily. 30 tablet 5  . HYDROcodone-acetaminophen (NORCO/VICODIN) 5-325 MG per tablet Take 1-2 tablets by mouth every 6 (six) hours as needed for moderate pain.    . medroxyPROGESTERone (DEPO-PROVERA) 150 MG/ML injection Inject 150 mg into the muscle every 3 (three) months.    [name] ibuprofen (ADVIL,MOTRIN) 600 MG tablet Take 600 mg by  mouth every 6 (six) hours as needed for moderate pain.    . Multiple Vitamin (MULTIVITAMIN WITH MINERALS) TABS Take 1 tablet by mouth daily.     No current facility-administered medications on file prior to visit.   No Known Allergies   No pertinent family history.  PE: Pulse 94  Temp(Src) 99.1 F (37.3 C) (Oral)  Resp 12  Ht 5' 3"  (1.6 m)  Wt 161 lb 6.4 oz (73.211 kg)  BMI 28.60 kg/m2  SpO2 95% Wt Readings from Last 3 Encounters:  10/27/14 161 lb 6.4 oz (73.211 kg)  10/24/14 163 lb (73.936 kg)  10/05/14 165 lb (74.844 kg)   Constitutional: overweight, in NAD Eyes: PERRLA, EOMI, no exophthalmos ENT: moist mucous membranes, no thyromegaly, no cervical lymphadenopathy Cardiovascular: RRR, No MRG Respiratory: CTA B Gastrointestinal: abdomen soft, NT, ND, BS+ Musculoskeletal: no deformities, strength intact in all 4 Skin: moist, warm, no rashes, no darkening of buccal mucosa Neurological: no tremor with outstretched hands, DTR normal in all 4  ASSESSMENT: 1. Generalized body aches  2. Fatigue  PLAN:  1. Generalized body aches - ? Adrenal insufficiency - we discussed about what adrenal insufficiency means and will I Dr. Baxter Flattery suspected this is her. We reviewed her previous cortisol level, which was drawn at 12 and he was undetectable. This suggests, but does not demonstrate adrenal insufficiency, so we will need to go ahead with a cosyntropin stimulation test. I explained what this entails and she will come to the lab ASAP to have this done. - we discussed about proper replacement with Hydrocortisone indication turns out to have adrenal insufficiency - a bid dose is likely best >> 10 or 15 mg in am and 5 mg in pm, best ~3 pm - we also discussed about sick days rules:  If you cannot keep anything down, including your hydrocortisone medication, please go to the emergency room or your primary care physician office to get steroids injected in the muscle or vein.  If you have a  fever (more than 100 Fahrenheit), please double the dose of your hydrocortisone for the duration of the fever.  Do not run out of your hydrocortisone medication. - if the diagnosis of adrenal insufficiency is concerned, I advised her to obtain a med alert bracelet or pendant mentioning "adrenal insufficiency" - discussed about possible causes for adrenal insufficiency - reviewed the report of her 08/2013 brain MRI, that did not show a pituitary tumor - she is on Provera, which can cause AI - I would like to see her back in 2 months  2. Fatigue - We will investigate her for adrenal insufficiency as mentioned above - Reviewed together her latest thyroid tests, and they were normal - she has mild anemia - I would like to check a B12 vitamin level, since she also describes burning of her tongue - will also check a vit D level - reviewed previous ESR  levels and they were elevated, but decreasing - no liver or kidney dysfunction  - time spent with the patient: 1 hour, of which >50% was spent in obtaining information about her symptoms, reviewing her previous labs, evaluations, and treatments, counseling her about her conditions (please see the discussed topics above), and developing a plan to further investigate them. She had a number of questions which I addressed.  Component     Latest Ref Rng 10/30/2014 10/30/2014 10/30/2014         9:11 AM 10:22 AM 10:52 AM  Cortisol, Plasma      16.8 18.4 19.1   Component     Latest Ref Rng 10/30/2014  VITD     30.00 - 100.00 ng/mL 25.81 (L)  Vitamin B-12     211 - 911 pg/mL >1500 (H)   Msg sent: Dear Ms [name],  The ACTH is not back yet...  This is what I have so far: your am cortisol is very good. The 60 min cortisol is >18, which is reassuring against adrenal insufficiency.  Your vitamin D is a little low >> please start over the counter vitamin D 2000 units daily.  Your B12 is high, so you are not deficient. Are you taking B12 supplements?   Sincerely,  Philemon Kingdom MD   ACTH 133 >> I advised the pt to return for a 24 h urine collection.   Component     Latest Ref Rng 11/21/2014  Cortisol (Ur), Free     4.0 - 50.0 mcg/24 h <4 mcg/24h  Results received     0.63 - 2.50 g/24 h 1.74  Creatinine, Urine      134.3  Creatinine, 24H Ur     700 - 1800 mg/day 1746   Labs pointing towards primary adrenal insufficiency. The stimulation test was unusual, with a normal a.m. cortisol, however which did not increase in an hour.   We will start hydrocortisone 10 mg in the morning and 5 mg in the afternoon, no later than 6 PM. I would like to see her back as soon as possible to discuss the need for fludrocortisone and to discuss sick day rules.

## 2014-10-27 NOTE — Patient Instructions (Signed)
Please come back for a stimulation test at 8 am on Monday. Please come back for a follow-up appointment in 2 months.

## 2014-10-30 ENCOUNTER — Other Ambulatory Visit (INDEPENDENT_AMBULATORY_CARE_PROVIDER_SITE_OTHER): Payer: Managed Care, Other (non HMO)

## 2014-10-30 ENCOUNTER — Other Ambulatory Visit (INDEPENDENT_AMBULATORY_CARE_PROVIDER_SITE_OTHER): Payer: Managed Care, Other (non HMO) | Admitting: *Deleted

## 2014-10-30 DIAGNOSIS — R5383 Other fatigue: Secondary | ICD-10-CM

## 2014-10-30 DIAGNOSIS — R635 Abnormal weight gain: Secondary | ICD-10-CM

## 2014-10-30 DIAGNOSIS — R52 Pain, unspecified: Secondary | ICD-10-CM

## 2014-10-30 LAB — VITAMIN B12: Vitamin B-12: >1500 pg/mL — ABNORMAL HIGH (ref 211–911)

## 2014-10-30 LAB — CORTISOL
Cortisol, Plasma: 16.8 ug/dL
Cortisol, Plasma: 18.4 ug/dL
Cortisol, Plasma: 19.1 ug/dL

## 2014-10-30 LAB — VITAMIN D 25 HYDROXY (VIT D DEFICIENCY, FRACTURES): VITD: 25.81 ng/mL — ABNORMAL LOW (ref 30.00–100.00)

## 2014-10-30 MED ORDER — COSYNTROPIN 0.25 MG IJ SOLR
0.2500 mg | Freq: Once | INTRAMUSCULAR | Status: AC
  Administered 2014-10-30: 0.25 mg via INTRAMUSCULAR

## 2014-10-31 ENCOUNTER — Telehealth: Payer: Self-pay | Admitting: Internal Medicine

## 2014-10-31 NOTE — Telephone Encounter (Signed)
Called pt and advised her per Dr Gherghe's note. Pt understood.  

## 2014-10-31 NOTE — Telephone Encounter (Signed)
I am waiting for her ACTH. Not back yet.

## 2014-10-31 NOTE — Telephone Encounter (Signed)
Pt called requesting lab results. Please advise.  

## 2014-10-31 NOTE — Telephone Encounter (Signed)
Patient is calling for the results of her lab work °

## 2014-11-01 LAB — ACTH: C206 ACTH: 133 pg/mL — ABNORMAL HIGH (ref 6–50)

## 2014-11-03 ENCOUNTER — Telehealth: Payer: Self-pay | Admitting: Internal Medicine

## 2014-11-03 NOTE — Telephone Encounter (Signed)
Returned Kelsey Rodriguez's call. Kelsey Rodriguez asked about lab results. Advised Kelsey Rodriguez that Dr Cruzita Lederer had sent her a message through [facility]. She did not read them. Read them to Kelsey Rodriguez. Kelsey Rodriguez understood. Kelsey Rodriguez stated she does not take Vit B12 supplements. Be advised.

## 2014-11-03 NOTE — Telephone Encounter (Signed)
Patient would like to speak with Dr. Cruzita Lederer assistant    Please advise   Thank you

## 2014-11-07 ENCOUNTER — Telehealth: Payer: Self-pay | Admitting: *Deleted

## 2014-11-07 NOTE — Telephone Encounter (Signed)
i thought i filled this out already, but will look around my stacks for it.

## 2014-11-07 NOTE — Telephone Encounter (Signed)
Patient called regarding FMLA paperwork that Dr. Baxter Flattery agreed to fill out for her. Explained that the MD is out until next week 11/14/14 and would forward this note to her as a reminder.

## 2014-11-15 NOTE — Telephone Encounter (Signed)
Spoke with patient, asked if she could provide another copy of her paperwork.  She will contact her work for this.

## 2014-11-20 ENCOUNTER — Encounter: Payer: Self-pay | Admitting: *Deleted

## 2014-11-21 ENCOUNTER — Other Ambulatory Visit: Payer: Managed Care, Other (non HMO)

## 2014-11-22 LAB — CREATININE, URINE, 24 HOUR
Creatinine, 24H Ur: 1746 mg/d (ref 700–1800)
Creatinine, Urine: 134.3 mg/dL

## 2014-11-28 LAB — CORTISOL, URINE, 24 HOUR: RESULTS RECEIVED: 1.74 g/(24.h) (ref 0.63–2.50)

## 2014-11-28 MED ORDER — HYDROCORTISONE 5 MG PO TABS: ORAL_TABLET | ORAL | Status: DC

## 2014-11-28 NOTE — Addendum Note (Signed)
Addended by: Philemon Kingdom on: 11/28/2014 05:47 PM   Modules accepted: Orders, Level of Service

## 2014-12-20 ENCOUNTER — Ambulatory Visit (INDEPENDENT_AMBULATORY_CARE_PROVIDER_SITE_OTHER): Payer: 59 | Admitting: Internal Medicine

## 2014-12-20 VITALS — BP 126/86 | HR 78 | Temp 99.1°F | Wt 164.0 lb

## 2014-12-20 DIAGNOSIS — Z30013 Encounter for initial prescription of injectable contraceptive: Secondary | ICD-10-CM

## 2014-12-20 DIAGNOSIS — G609 Hereditary and idiopathic neuropathy, unspecified: Secondary | ICD-10-CM

## 2014-12-20 DIAGNOSIS — R630 Anorexia: Secondary | ICD-10-CM

## 2014-12-20 DIAGNOSIS — B20 Human immunodeficiency virus [HIV] disease: Secondary | ICD-10-CM

## 2014-12-20 DIAGNOSIS — E2740 Unspecified adrenocortical insufficiency: Secondary | ICD-10-CM

## 2014-12-20 DIAGNOSIS — Z3042 Encounter for surveillance of injectable contraceptive: Secondary | ICD-10-CM

## 2014-12-20 MED ORDER — DRONABINOL 5 MG PO CAPS: 5.0000 mg | ORAL_CAPSULE | Freq: Two times a day (BID) | ORAL | Status: DC

## 2014-12-20 MED ORDER — PREGABALIN 75 MG PO CAPS: 75.0000 mg | ORAL_CAPSULE | Freq: Every day | ORAL | Status: DC

## 2014-12-20 MED ORDER — MEDROXYPROGESTERONE ACETATE 150 MG/ML IM SUSP
150.0000 mg | Freq: Once | INTRAMUSCULAR | Status: AC
  Administered 2014-12-20: 150 mg via INTRAMUSCULAR

## 2014-12-20 NOTE — Progress Notes (Signed)
Patient ID: Kelsey Rodriguez, female   DOB: 1991-07-21, 24 y.o.   MRN: [id number]       Patient ID: Kelsey Rodriguez, female   DOB: [birth year], 24 y.o.   MRN: [id number]  HPI 24yo F with HIV disease, prior hx of mTB, truvada/DRVr, had constellation of symptoms that led to dx of adrenal insufficiency.she has recently started on hydrocortisone which has been helpful.  She still has fatigue improving. Occasional burning sensation to lower extremities. Still has intermittent chest pain that is non cardiac, costochondritis  Outpatient Encounter Prescriptions as of 12/20/2014  Medication Sig  . BIOTIN PO Take 1 tablet by mouth daily.  . Darunavir Ethanolate (PREZISTA) 800 MG tablet Take 1 tablet (800 mg total) by mouth daily.  [name] emtricitabine-tenofovir (TRUVADA) 200-300 MG per tablet Take 1 tablet by mouth daily.  [name] HYDROcodone-acetaminophen (NORCO/VICODIN) 5-325 MG per tablet Take 1-2 tablets by mouth every 6 (six) hours as needed for moderate pain.  . hydrocortisone (CORTEF) 5 MG tablet Take 2 tablets in the morning and 1 tablet in the afternoon, no later than 6 PM  . ibuprofen (ADVIL,MOTRIN) 600 MG tablet Take 600 mg by mouth every 6 (six) hours as needed for moderate pain.  . medroxyPROGESTERone (DEPO-PROVERA) 150 MG/ML injection Inject 150 mg into the muscle every 3 (three) months.  . Multiple Vitamin (MULTIVITAMIN WITH MINERALS) TABS Take 1 tablet by mouth daily.  . ritonavir (NORVIR) 100 MG TABS tablet Take 1 tablet (100 mg total) by mouth daily.  . TRUVADA 200-300 MG per tablet TAKE 1 TABLET BY MOUTH DAILY     Patient Active Problem List   Diagnosis Date Noted  . Fatigue 09/25/2014  . Bell's palsy 08/26/2013  . Chest pain 07/07/2013  . 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 Laceration of ankle, right 11/18/2012  . 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 Vaginal Discharge 05/30/2012  . Arthralgia 05/20/2012  . HIV (human immunodeficiency virus infection) 03/16/2012  . Dysphagia 03/11/2012  . Tuberculosis of  mediastinal lymph nodes 03/11/2012  . Anemia of chronic disease 03/11/2012  . Reflux esophagitis 03/11/2012  . 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   Review of Systems Positive pertinents listed in hpi. Otherwise 10 point ros is negative Physical Exam   BP 126/86 mmHg  Pulse 78  Temp(Src) 99.1 F (37.3 C) (Oral)  Wt 164 lb (74.39 kg) Physical Exam  Constitutional:  oriented to person, place, and time. appears well-developed and well-nourished. No distress.  HENT:  Mouth/Throat: Oropharynx is clear and moist. No oropharyngeal exudate.  Cardiovascular: Normal rate, regular rhythm and normal heart sounds. Exam reveals no gallop and no friction rub.  No murmur heard.  Pulmonary/Chest: Effort normal and breath sounds normal. No respiratory distress.  has no wheezes.  Abdominal: Soft. Bowel sounds are normal.  exhibits no distension. There is no tenderness.  Lymphadenopathy: no cervical adenopathy.  Neurological: alert and oriented to person, place, and time.  Skin: Skin is warm and dry. No rash noted. No erythema.  Ext: trace pitting edema on lower extremties bilaterally Psychiatric: a normal mood and affect. behavior is normal.   Lab Results  Component Value Date   CD4TCELL 26* 10/24/2014   Lab Results  Component Value Date   CD4TABS 610 10/24/2014   CD4TABS 600 06/26/2014   CD4TABS 540 02/28/2014   Lab Results  Component Value Date   HIV1RNAQUANT 45* 10/24/2014   Lab Results  Component Value Date  HEPBSAB NEGATIVE 03/13/2012   No results found for: RPR  CBC Lab Results  Component Value Date   WBC 4.7 10/24/2014   RBC 4.14 10/24/2014   HGB 11.1* 10/24/2014   HCT 32.3* 10/24/2014   PLT 333 10/24/2014   MCV 78.0 10/24/2014   MCH 26.8 10/24/2014   MCHC 34.4 10/24/2014   RDW 16.5* 10/24/2014   LYMPHSABS 2.2 10/24/2014   MONOABS 0.6 10/24/2014   EOSABS 0.3 10/24/2014   BASOSABS 0.0  10/24/2014   BMET Lab Results  Component Value Date   NA 136 10/24/2014   K 4.4 10/24/2014   CL 104 10/24/2014   CO2 21 10/24/2014   GLUCOSE 71 10/24/2014   BUN 7 10/24/2014   CREATININE 0.79 10/24/2014   CALCIUM 9.6 10/24/2014   GFRNONAA >89 10/24/2014   GFRAA >89 10/24/2014     Assessment and Plan  hiv disease with hx of pulmonary tb= well controlled. Continue with current regimen  Adrenal insufficiency = continue on hydrocortisone as dr. Cruzita Lederer has prescribed. Will also have her discontinue megace, as a possible cause of adrenal insufficiency. She reports taking once per week.  Loss of appetite = can use marinol prn  Peripheral neuropathy = will have her use prn lyrica  Vit b12 surplus = asked her not to take supplements  Vit d deficiency = continue on 2,000 iu daily  rtc in 3 months

## 2014-12-27 ENCOUNTER — Ambulatory Visit (INDEPENDENT_AMBULATORY_CARE_PROVIDER_SITE_OTHER): Payer: 59 | Admitting: Internal Medicine

## 2014-12-27 ENCOUNTER — Encounter: Payer: Self-pay | Admitting: Internal Medicine

## 2014-12-27 VITALS — BP 130/88 | HR 99 | Temp 98.1°F | Resp 12 | Wt 165.0 lb

## 2014-12-27 DIAGNOSIS — E271 Primary adrenocortical insufficiency: Secondary | ICD-10-CM

## 2014-12-27 LAB — BASIC METABOLIC PANEL
BUN: 8 mg/dL (ref 6–23)
CO2: 26 mEq/L (ref 19–32)
Calcium: 9.8 mg/dL (ref 8.4–10.5)
Chloride: 105 mEq/L (ref 96–112)
Creatinine, Ser: 0.84 mg/dL (ref 0.40–1.20)
GFR: 105.41 mL/min (ref >60.00)
Glucose, Bld: 82 mg/dL (ref 70–99)
Potassium: 4.1 mEq/L (ref 3.5–5.1)
Sodium: 137 mEq/L (ref 135–145)

## 2014-12-27 NOTE — Patient Instructions (Addendum)
Please continue Hydrocortisone 10 mg in am and 5 mg in pm, no later than 6 pm.  Sick days rules:  If you cannot keep anything down, including your hydrocortisone medication, please go to the emergency room or your primary care physician office to get steroids injected in the muscle or vein.  If you have a fever (more than 100 Fahrenheit), please double the dose of your hydrocortisone for the duration of the fever.  Do not run out of your hydrocortisone medication.  Please get a MedAlert bracelet or pendant mentioning: ADRENAL INSUFFICIENCY  Please come back for a follow-up appointment in 6 months.  Please stop at the lab.

## 2014-12-27 NOTE — Progress Notes (Signed)
Patient ID: Kelsey Rodriguez, female   DOB: Mar 06, 1991, 24 y.o.   MRN: [id number]   HPI  Kelsey Rodriguez is a 24 y.o.-year-old female, returning for f/u for her new dx of primary adrenal insufficiency. This is her first appt to discuss this dx.  Reviewed hx: Pt developed a lot of pain in knees and ankles 2 mo ago >> went to the ED 1x>> started Hydrocodone >> sent home >> swelling (gained 30 lbs in 1 mo) >> went to the ED again >> started on diuretic >> sent home >> she did not urinate more >> weight still high but improved, but she feels the edema is better.  She describes AP and constipation at the time when she had the swelling. She also had a sore tongue.She denies N/V.  She had meningitis last year 09/2013 >> HAs since then.  She described no appetite for 3 mo prior to this episode >> lost weight 140 >> 130 lbs. No N/V.  Denies h/o prednisone or other steroids, but per review of the chart, she had a Dexamethasone inj in 08/2013, Toradol inj before a lung Bx 03/2012.  She is on Provera since 09/2013, last inj 09/26/2014.  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   Component     Latest Ref Rng 11/21/2014  Cortisol (Ur), Free     4.0 - 50.0 mcg/24 h <4 mcg/24h  Results received     0.63 - 2.50 g/24 h 1.74  Creatinine, Urine      134.3  Creatinine, 24H Ur     700 - 1800 mg/day 1746   Labs pointed towards primary adrenal insufficiency >> We started Hydrocortisone - she is taking 10 mg at 9 am and 5 mg at  4-5 pm.   Pt feels better after starting the steroid: - less SOB and wheezing - less fatigue after the last ED visit - less HAs - no weight gain - + back and leg pain. - + constipation  - no dry skin - no hair falling - no cold intolerance  - no anxiety/ no depression  No craving salt. No dizziness when stands up.   She was taking Megace and now switched to Marinol. She did not start it yet.  She also has a history of HIV, lung TB.  ROS: Constitutional: + weight gain, + fatigue, + subjective hypothermia Eyes: + blurry vision, no xerophthalmia ENT: no sore throat, no nodules palpated in throat, no dysphagia/odynophagia, no hoarseness Cardiovascular: + CP - positional and with deep breaths - nerve damage from the Sx/SOB/+ occasionalpalpitations/no leg swelling Respiratory: no cough/+ SOB Gastrointestinal: + N/no /V/D/+ C/+ heartburn Musculoskeletal:no  muscle/joint aches Skin: no rashes, + itching Neurological: no tremors/numbness/tingling/dizziness, + HA  I reviewed pt's medications, allergies, PMH, social hx, family hx, and changes were documented in the history of  present illness. Otherwise, unchanged from my initial visit note.  Past Medical History  Diagnosis Date  . Tuberculosis   . HIV (human immunodeficiency virus infection) 02/2012  . Pelvic pain   . 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 Bell's palsy 08/26/2013  . Anemia of chronic disease 03/11/2012  . 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 Laceration of ankle, right 11/18/2012  . Reflux esophagitis 03/11/2012  . 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 Esophagogastroduodenoscopy  03/11/2012    Procedure: ESOPHAGOGASTRODUODENOSCOPY (EGD);  Surgeon: Lafayette Dragon, MD;  Location: [facility] ENDOSCOPY;  Service: Endoscopy;  Laterality: N/A;  . Appendectomy  ~ 2000  . Lung biopsy  02/2012  . Dilation and curettage of uterus  2008  .  Esophagogastroduodenoscopy N/A 03/07/2014    Procedure: ESOPHAGOGASTRODUODENOSCOPY (EGD);  Surgeon: Gatha Mayer, MD;  Location: [facility] ENDOSCOPY;  Service: Endoscopy;  Laterality: N/A;   History   Social History  . Marital Status: Single    Spouse Name: N/A    Number of Children: 0   Occupational History  . CNA   Social History Main Topics  . Smoking status: Never Smoker   . Smokeless tobacco: Never Used  . Alcohol Use: No     Comment: socially  . Drug Use: No  . Sexual Activity:    Partners: Male   Current Outpatient Prescriptions on File Prior to Visit  Medication Sig Dispense Refill  . BIOTIN PO Take 1 tablet by mouth daily.    . Darunavir Ethanolate (PREZISTA) 800 MG tablet Take 1 tablet (800 mg total) by mouth daily. 30 tablet 3  . dronabinol (MARINOL) 5 MG capsule Take 1 capsule (5 mg total) by mouth 2 (two) times daily before a meal. As needed 30 capsule 0  . emtricitabine-tenofovir (TRUVADA) 200-300 MG per tablet Take 1 tablet by mouth daily. 30 tablet 5  . HYDROcodone-acetaminophen (NORCO/VICODIN) 5-325 MG per tablet Take 1-2 tablets by mouth every 6 (six) hours as needed for moderate pain.    . hydrocortisone (CORTEF) 5 MG tablet Take 2 tablets in the morning and 1 tablet in the afternoon, no later than 6 PM 100 tablet 2  . ibuprofen (ADVIL,MOTRIN) 600 MG tablet Take 600 mg by mouth every 6 (six) hours as needed for moderate pain.    . medroxyPROGESTERone (DEPO-PROVERA) 150 MG/ML injection Inject 150 mg into the muscle every 3 (three) months.    . Multiple Vitamin (MULTIVITAMIN WITH MINERALS) TABS Take 1 tablet by mouth daily.    . pregabalin (LYRICA) 75 MG capsule Take 1 capsule (75 mg total) by mouth daily. As needed for leg pain 30 capsule 3  . ritonavir (NORVIR) 100 MG TABS tablet Take 1 tablet (100 mg total) by mouth daily. 30 tablet 3  . TRUVADA 200-300 MG per tablet TAKE 1 TABLET BY MOUTH DAILY 30 tablet 0   No current facility-administered medications on file  prior to visit.   No Known Allergies   No pertinent family history.  PE: BP 130/88 mmHg  Pulse 99  Temp(Src) 98.1 F (36.7 C) (Oral)  Resp 12  Wt 165 lb (74.844 kg)  SpO2 98% Body mass index is 29.24 kg/(m^2).  Wt Readings from Last 3 Encounters:  12/27/14 165 lb (74.844 kg)  12/20/14 164 lb (74.39 kg)  10/27/14 161 lb 6.4 oz (73.211 kg)   Constitutional: overweight, in NAD Eyes: PERRLA, EOMI, no exophthalmos ENT: moist mucous membranes,  no thyromegaly, no cervical lymphadenopathy Cardiovascular: RRR, No MRG Respiratory: CTA B Gastrointestinal: abdomen soft, NT, ND, BS+ Musculoskeletal: no deformities, strength intact in all 4 Skin: moist, warm, no rashes, no darkening of buccal mucosa Neurological: no tremor with outstretched hands, DTR normal in all 4  ASSESSMENT: 1. Primary adrenal insufficiency - 08/2013 brain MRI did not show a pituitary tumor  PLAN:  1. Primary adrenal insufficiency - reviewed all her pertinent labs >> explained that we caught the AI early >> her am cortisol was normal, but it did not stimulate by ACTH and her ACTH was high - we again discussed about what adrenal insufficiency means and about possible causes for adrenal insufficiency. Approximately 70% of primary AI is autoimmune, but other causes also possible: hemorrhage, neoplasm, infections. We discussed that she has a h/o Bx positive for TB, however, the adrenals were normal per CT abdomen 10/20/2014, not showing calcifications or masses. - we discussed about proper replacement with Hydrocortisone - a bid dose is likely best: 10 or 15 mg in am and 5 mg in pm, best ~3 pm - we also discussed about sick days rules - see instructions below - we also should determine if she needs Fluorinef (mineralocorticoid), but she does not have salt craving, orthostatic sxs, and her BP is on the high side. I will check a Na, K, PRA and Aldosterone today.  Patient Instructions  Please continue Hydrocortisone 10 mg  in am and 5 mg in pm, no later than 6 pm.  Sick days rules:  If you cannot keep anything down, including your hydrocortisone medication, please go to the emergency room or your primary care physician office to get steroids injected in the muscle or vein.  If you have a fever (more than 100 Fahrenheit), please double the dose of your hydrocortisone for the duration of the fever.  Do not run out of your hydrocortisone medication.  Please get a MedAlert bracelet or pendant mentioning: ADRENAL INSUFFICIENCY  Please come back for a follow-up appointment in 6 months.  Please stop at the lab.  -Return in about 6 months (around 06/27/2015).  Office Visit on 12/27/2014  Component Date Value Ref Range Status  . PRA LC/MS/MS 12/27/2014 1.60  0.25 - 5.82 ng/mL/h Final  . ALDO / PRA Ratio 12/27/2014 3.8  0.9 - 28.9 Ratio Final  . Aldosterone 12/27/2014 6   Final   Comment:      Adult Reference Ranges for Aldosterone,    LC/MS/MS:       Upright 8:00-10:00 am    < or = 28 ng/dL     Upright 4:00-6:00 pm     < or = 21 ng/dL     Supine  8:00-10:00 am    3-16 ng/dL   . Sodium 12/27/2014 137  135 - 145 mEq/L Final  . Potassium 12/27/2014 4.1  3.5 - 5.1 mEq/L Final  . Chloride 12/27/2014 105  96 - 112 mEq/L Final  . CO2 12/27/2014 26  19 - 32 mEq/L Final  . Glucose, Bld 12/27/2014 82  70 - 99 mg/dL Final  . BUN 12/27/2014 8  6 - 23 mg/dL Final  . Creatinine, Ser 12/27/2014 0.84  0.40 - 1.20 mg/dL Final  . Calcium 12/27/2014 9.8  8.4 - 10.5 mg/dL Final  . GFR 12/27/2014 105.41  >60.00 mL/min Final   Na, K, PRA and Aldo normal >> no need to add Fludrocortisone for now.

## 2014-12-30 ENCOUNTER — Other Ambulatory Visit: Payer: Self-pay | Admitting: Internal Medicine

## 2014-12-30 DIAGNOSIS — B20 Human immunodeficiency virus [HIV] disease: Secondary | ICD-10-CM

## 2015-01-02 LAB — ALDOSTERONE + RENIN ACTIVITY W/ RATIO
ALDO / PRA Ratio: 3.8 Ratio (ref 0.9–28.9)
Aldosterone: 6 ng/dL
PRA LC/MS/MS: 1.6 ng/mL/h (ref 0.25–5.82)

## 2015-01-03 DIAGNOSIS — E271 Primary adrenocortical insufficiency: Secondary | ICD-10-CM | POA: Insufficient documentation

## 2015-01-11 ENCOUNTER — Other Ambulatory Visit: Payer: Self-pay | Admitting: Licensed Clinical Social Worker

## 2015-01-11 DIAGNOSIS — B20 Human immunodeficiency virus [HIV] disease: Secondary | ICD-10-CM

## 2015-01-11 MED ORDER — RITONAVIR 100 MG PO TABS: 100.0000 mg | ORAL_TABLET | Freq: Every day | ORAL | Status: DC

## 2015-01-11 MED ORDER — DARUNAVIR ETHANOLATE 800 MG PO TABS: 800.0000 mg | ORAL_TABLET | Freq: Every day | ORAL | Status: DC

## 2015-01-11 MED ORDER — EMTRICITABINE-TENOFOVIR DF 200-300 MG PO TABS: 1.0000 | ORAL_TABLET | Freq: Every day | ORAL | Status: DC

## 2015-01-15 ENCOUNTER — Telehealth: Payer: Self-pay | Admitting: *Deleted

## 2015-01-15 NOTE — Telephone Encounter (Signed)
Needing PA for Lyrica, not covered by [facility]  Started PA.  Awaiting response from [facility].

## 2015-01-16 NOTE — Telephone Encounter (Signed)
[place] [employer] denied the PA.  Patient needs to have proof of failure, contraindication or intolerance to gabapentin or duloxetine first.  Notice of denial placed in PA folder in Triage. Landis Gandy, RN

## 2015-01-18 ENCOUNTER — Telehealth: Payer: Self-pay | Admitting: *Deleted

## 2015-01-18 NOTE — Telephone Encounter (Signed)
Pt wanting to know about Lyrica rx.  Notified pt that Lyrica not approved, will ask MD for new rx for pain.

## 2015-01-22 ENCOUNTER — Telehealth: Payer: Self-pay | Admitting: *Deleted

## 2015-01-22 ENCOUNTER — Telehealth: Payer: Self-pay | Admitting: Internal Medicine

## 2015-01-22 NOTE — Telephone Encounter (Signed)
Called pt and advised her per Dr Arman Filter note. Pt understood and will contact her PCP>

## 2015-01-22 NOTE — Telephone Encounter (Signed)
Pt c/o "dizziness, fainting-like episodes while driving and at work over the last week."  Requesting appt.  Given appt w/ Dr. Johnnye Sima for 01/23/15.

## 2015-01-22 NOTE — Telephone Encounter (Signed)
Please call and d/w PCP - this does not seem to be related to her adrenal insufficiency since she has high BP.

## 2015-01-22 NOTE — Telephone Encounter (Signed)
Pt called stating that she was dizzy and felt like she was going to pass out. Happened 2 times during the week last week and 3 times on Saturday at work. Pt almost passed out each time. Pt states they checked her bp at work and told her it was high. Pt does not know what her bp was. They sent her home. Pt is concerned and does not know what to do. Please advise.

## 2015-01-23 ENCOUNTER — Other Ambulatory Visit: Payer: Self-pay

## 2015-01-23 ENCOUNTER — Ambulatory Visit (HOSPITAL_COMMUNITY)
Admission: RE | Admit: 2015-01-23 | Discharge: 2015-01-23 | Disposition: A | Discharge status: Home / Self Care | Payer: Managed Care, Other (non HMO) | Source: Ambulatory Visit | Attending: Internal Medicine | Admitting: Internal Medicine

## 2015-01-23 ENCOUNTER — Encounter: Payer: Self-pay | Admitting: Infectious Diseases

## 2015-01-23 ENCOUNTER — Ambulatory Visit (INDEPENDENT_AMBULATORY_CARE_PROVIDER_SITE_OTHER): Payer: Managed Care, Other (non HMO) | Admitting: Infectious Diseases

## 2015-01-23 VITALS — BP 116/84 | HR 90 | Temp 98.7°F | Wt 165.0 lb

## 2015-01-23 DIAGNOSIS — Z79899 Other long term (current) drug therapy: Secondary | ICD-10-CM

## 2015-01-23 DIAGNOSIS — Z21 Asymptomatic human immunodeficiency virus [HIV] infection status: Secondary | ICD-10-CM

## 2015-01-23 DIAGNOSIS — R42 Dizziness and giddiness: Secondary | ICD-10-CM | POA: Diagnosis not present

## 2015-01-23 DIAGNOSIS — Z113 Encounter for screening for infections with a predominantly sexual mode of transmission: Secondary | ICD-10-CM

## 2015-01-23 DIAGNOSIS — B20 Human immunodeficiency virus [HIV] disease: Secondary | ICD-10-CM

## 2015-01-23 LAB — CBC
HCT: 37 % (ref 36.0–46.0)
Hemoglobin: 12.2 g/dL (ref 12.0–15.0)
MCH: 27.2 pg (ref 26.0–34.0)
MCHC: 33 g/dL (ref 30.0–36.0)
MCV: 82.6 fL (ref 78.0–100.0)
MPV: 10.3 fL (ref 8.6–12.4)
Platelets: 332 10*3/uL (ref 150–400)
RBC: 4.48 MIL/uL (ref 3.87–5.11)
RDW: 19.1 % — ABNORMAL HIGH (ref 11.5–15.5)
WBC: 6.5 10*3/uL (ref 4.0–10.5)

## 2015-01-23 LAB — POCT URINE PREGNANCY: Preg Test, Ur: NEGATIVE

## 2015-01-23 NOTE — Assessment & Plan Note (Signed)
Will check her TSH and cortisol Her ECG is NSR 78bpm, "septal infarct, age undetermined" Will have her seen by neurology and CV

## 2015-01-23 NOTE — Assessment & Plan Note (Signed)
She is doing well. Will repeat her labs today.  Offered condoms.  She got 1 hep B, will check her titer.  She is hep A immune.  Flu and pnvx up to date.  rtc as scheduled with Dr Baxter Flattery.

## 2015-01-23 NOTE — Progress Notes (Signed)
   Subjective:    Patient ID: Kelsey Rodriguez, female    DOB: 1991/02/28, 24 y.o.   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 [name] Has been on truvada/DRVr. Also hx of adrenal insuficiency on steroid replacement.  Denies problems with ART.  Has been having dizzy spells, nearly passing out. Last episode was 8 days ago. Had while at work and while driving her car. Had another episode 7 days ago, then 4 days ago. Sitting down as well as standing at computer. No LOC. Room was spinning. + headache. No CP or SOB. Felt her heart racing.  Not pregnant.   On depo shot.  Has been having pain in her legs, prev rx for lyrica was not covered by insurance. Now worse in LLE.  Has had episodes of constipation, took pro-biotic which has helped.   HIV 1 RNA QUANT (copies/mL)  Date Value  10/24/2014 45*  06/26/2014 <20  02/28/2014 22*   CD4 T CELL ABS (/uL)  Date Value  10/24/2014 610  06/26/2014 600  02/28/2014 540     Review of Systems  Constitutional: Negative for appetite change and unexpected weight change.  Gastrointestinal: Positive for constipation. Negative for diarrhea.  Genitourinary: Negative for difficulty urinating and menstrual problem.       Objective:   Physical Exam  Constitutional: She appears well-developed and well-nourished.  HENT:  Right Ear: No drainage or tenderness.  Left Ear: No drainage or tenderness.  Mouth/Throat: No oropharyngeal exudate.  Eyes: EOM are normal. Pupils are equal, round, and reactive to light. No scleral icterus.  Neck: Neck supple. Carotid bruit is not present. No thyromegaly present.  Cardiovascular: Normal rate, regular rhythm and normal heart sounds.   Pulmonary/Chest: Effort normal and breath sounds normal.  Abdominal: Soft. Bowel sounds are normal. She exhibits no distension. There is no tenderness.  Lymphadenopathy:    She has no cervical  adenopathy.  Neurological: She is alert. She has normal strength. No cranial nerve deficit.  She has mild dizziness with raising to sitting from lying, raising to standing from sitting.  She has no nystagmus.           Assessment & Plan:

## 2015-01-24 LAB — RPR

## 2015-01-24 LAB — LIPID PANEL
Cholesterol: 208 mg/dL — ABNORMAL HIGH (ref 0–200)
HDL: 54 mg/dL (ref >39)
LDL Cholesterol: 136 mg/dL — ABNORMAL HIGH (ref 0–99)
Total CHOL/HDL Ratio: 3.9 Ratio
Triglycerides: 88 mg/dL (ref <150)
VLDL: 18 mg/dL (ref 0–40)

## 2015-01-24 LAB — COMPREHENSIVE METABOLIC PANEL
ALT: 11 U/L (ref 0–35)
AST: 17 U/L (ref 0–37)
Albumin: 4.7 g/dL (ref 3.5–5.2)
Alkaline Phosphatase: 111 U/L (ref 39–117)
BUN: 11 mg/dL (ref 6–23)
CO2: 24 mEq/L (ref 19–32)
Calcium: 9.8 mg/dL (ref 8.4–10.5)
Chloride: 106 mEq/L (ref 96–112)
Creat: 0.84 mg/dL (ref 0.50–1.10)
Glucose, Bld: 65 mg/dL — ABNORMAL LOW (ref 70–99)
Potassium: 4.1 mEq/L (ref 3.5–5.3)
Sodium: 140 mEq/L (ref 135–145)
Total Bilirubin: 0.4 mg/dL (ref 0.2–1.2)
Total Protein: 7.9 g/dL (ref 6.0–8.3)

## 2015-01-24 LAB — TSH: TSH: 0.81 u[IU]/mL (ref 0.350–4.500)

## 2015-01-24 LAB — URINE CYTOLOGY ANCILLARY ONLY
Chlamydia: NEGATIVE
Neisseria Gonorrhea: NEGATIVE

## 2015-01-24 LAB — T-HELPER CELL (CD4) - (RCID CLINIC ONLY)
CD4 % Helper T Cell: 24 % — ABNORMAL LOW (ref 33–55)
CD4 T Cell Abs: 650 /uL (ref 400–2700)

## 2015-01-24 LAB — CORTISOL: Cortisol, Plasma: 2.9 ug/dL

## 2015-01-25 LAB — HIV-1 RNA QUANT-NO REFLEX-BLD
HIV 1 RNA Quant: <20 copies/mL (ref <20)
HIV-1 RNA Quant, Log: <1.3 {Log} (ref <1.30)

## 2015-01-30 ENCOUNTER — Emergency Department (HOSPITAL_COMMUNITY)
Admission: EM | Admit: 2015-01-30 | Discharge: 2015-01-30 | Disposition: A | Discharge status: Home / Self Care | Payer: 59 | Attending: Emergency Medicine | Admitting: Emergency Medicine

## 2015-01-30 ENCOUNTER — Encounter (HOSPITAL_COMMUNITY): Payer: Self-pay

## 2015-01-30 DIAGNOSIS — Z3202 Encounter for pregnancy test, result negative: Secondary | ICD-10-CM | POA: Insufficient documentation

## 2015-01-30 DIAGNOSIS — Z8719 Personal history of other diseases of the digestive system: Secondary | ICD-10-CM | POA: Diagnosis not present

## 2015-01-30 DIAGNOSIS — R42 Dizziness and giddiness: Secondary | ICD-10-CM | POA: Insufficient documentation

## 2015-01-30 DIAGNOSIS — Z8619 Personal history of other infectious and parasitic diseases: Secondary | ICD-10-CM | POA: Diagnosis not present

## 2015-01-30 DIAGNOSIS — Z862 Personal history of diseases of the blood and blood-forming organs and certain disorders involving the immune mechanism: Secondary | ICD-10-CM | POA: Insufficient documentation

## 2015-01-30 DIAGNOSIS — M5416 Radiculopathy, lumbar region: Secondary | ICD-10-CM | POA: Diagnosis not present

## 2015-01-30 DIAGNOSIS — Z79899 Other long term (current) drug therapy: Secondary | ICD-10-CM | POA: Diagnosis not present

## 2015-01-30 DIAGNOSIS — Z872 Personal history of diseases of the skin and subcutaneous tissue: Secondary | ICD-10-CM | POA: Diagnosis not present

## 2015-01-30 DIAGNOSIS — Z7952 Long term (current) use of systemic steroids: Secondary | ICD-10-CM | POA: Insufficient documentation

## 2015-01-30 DIAGNOSIS — Z87828 Personal history of other (healed) physical injury and trauma: Secondary | ICD-10-CM | POA: Diagnosis not present

## 2015-01-30 DIAGNOSIS — Z793 Long term (current) use of hormonal contraceptives: Secondary | ICD-10-CM | POA: Diagnosis not present

## 2015-01-30 DIAGNOSIS — Z21 Asymptomatic human immunodeficiency virus [HIV] infection status: Secondary | ICD-10-CM | POA: Diagnosis not present

## 2015-01-30 DIAGNOSIS — Z8669 Personal history of other diseases of the nervous system and sense organs: Secondary | ICD-10-CM | POA: Insufficient documentation

## 2015-01-30 DIAGNOSIS — M5432 Sciatica, left side: Secondary | ICD-10-CM | POA: Insufficient documentation

## 2015-01-30 LAB — BASIC METABOLIC PANEL
Anion gap: 3 — ABNORMAL LOW (ref 5–15)
BUN: <5 mg/dL — ABNORMAL LOW (ref 6–23)
CO2: 26 mmol/L (ref 19–32)
Calcium: 9.3 mg/dL (ref 8.4–10.5)
Chloride: 110 mmol/L (ref 96–112)
Creatinine, Ser: 0.75 mg/dL (ref 0.50–1.10)
GFR calc Af Amer: >90 mL/min (ref >90)
GFR calc non Af Amer: >90 mL/min (ref >90)
Glucose, Bld: 79 mg/dL (ref 70–99)
Potassium: 3.6 mmol/L (ref 3.5–5.1)
Sodium: 139 mmol/L (ref 135–145)

## 2015-01-30 LAB — CBC WITH DIFFERENTIAL/PLATELET
Basophils Absolute: 0 10*3/uL (ref 0.0–0.1)
Basophils Relative: 1 % (ref 0–1)
Eosinophils Absolute: 0.2 10*3/uL (ref 0.0–0.7)
Eosinophils Relative: 3 % (ref 0–5)
HCT: 36.8 % (ref 36.0–46.0)
Hemoglobin: 12 g/dL (ref 12.0–15.0)
Lymphocytes Relative: 41 % (ref 12–46)
Lymphs Abs: 2.5 10*3/uL (ref 0.7–4.0)
MCH: 27.5 pg (ref 26.0–34.0)
MCHC: 32.6 g/dL (ref 30.0–36.0)
MCV: 84.4 fL (ref 78.0–100.0)
Monocytes Absolute: 0.6 10*3/uL (ref 0.1–1.0)
Monocytes Relative: 10 % (ref 3–12)
Neutro Abs: 2.9 10*3/uL (ref 1.7–7.7)
Neutrophils Relative %: 45 % (ref 43–77)
Platelets: 288 10*3/uL (ref 150–400)
RBC: 4.36 MIL/uL (ref 3.87–5.11)
RDW: 17.8 % — ABNORMAL HIGH (ref 11.5–15.5)
WBC: 6.2 10*3/uL (ref 4.0–10.5)

## 2015-01-30 LAB — URINE MICROSCOPIC-ADD ON

## 2015-01-30 LAB — URINALYSIS, ROUTINE W REFLEX MICROSCOPIC
Bilirubin Urine: NEGATIVE
Glucose, UA: NEGATIVE mg/dL
Hgb urine dipstick: NEGATIVE
Ketones, ur: NEGATIVE mg/dL
Nitrite: NEGATIVE
Protein, ur: NEGATIVE mg/dL
Specific Gravity, Urine: 1.023 (ref 1.005–1.030)
Urobilinogen, UA: 0.2 mg/dL (ref 0.0–1.0)
pH: 6.5 (ref 5.0–8.0)

## 2015-01-30 LAB — POC URINE PREG, ED: Preg Test, Ur: NEGATIVE

## 2015-01-30 MED ORDER — PREDNISONE 50 MG PO TABS: 50.0000 mg | ORAL_TABLET | Freq: Every day | ORAL | Status: DC

## 2015-01-30 MED ORDER — HYDROCODONE-ACETAMINOPHEN 5-325 MG PO TABS: 1.0000 | ORAL_TABLET | Freq: Four times a day (QID) | ORAL | Status: DC | PRN

## 2015-01-30 MED ORDER — MECLIZINE HCL 25 MG PO TABS: 25.0000 mg | ORAL_TABLET | Freq: Three times a day (TID) | ORAL | Status: DC | PRN

## 2015-01-30 MED ORDER — SODIUM CHLORIDE 0.9 % IV BOLUS (SEPSIS)
1000.0000 mL | Freq: Once | INTRAVENOUS | Status: AC
  Administered 2015-01-30: 1000 mL via INTRAVENOUS

## 2015-01-30 MED ORDER — MECLIZINE HCL 25 MG PO TABS
25.0000 mg | ORAL_TABLET | Freq: Once | ORAL | Status: AC
  Administered 2015-01-30: 25 mg via ORAL
  Filled 2015-01-30: qty 1

## 2015-01-30 NOTE — ED Provider Notes (Signed)
CSN: [id number]     Arrival date & time 01/30/15  1039 History   First MD Initiated Contact with Patient 01/30/15 1056     Chief Complaint  Patient presents with  . Dizziness     (Consider location/radiation/quality/duration/timing/severity/associated sxs/prior Treatment) HPI Patient presents to the emergency department with complaints of vertigo over the last 3 weeks.  The patient states that she had a previous episode of vertigo sometime early in November.  The patient states that she has been seen by her primary care doctor.  He was referred to cardiology and to neurology for these symptoms.  Patient states that the symptoms seem to come and go.  She states that she is worse when standing.  She was not given any medications for her symptoms.  Patient denies chest pain, shortness of breath, vomiting, diarrhea, weakness, neck pain, fever, cough, runny nose, sore throat, earache, body aches or syncope.  The patient states that nothing seems to make her condition better standing and walking make the condition worse Past Medical History  Diagnosis Date  . Tuberculosis   . HIV (human immunodeficiency virus infection) 02/2012  . Pelvic pain   . 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 Bell's palsy 08/26/2013  . Anemia of chronic disease 03/11/2012  . 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 Laceration of ankle, right 11/18/2012  . Reflux esophagitis 03/11/2012  . 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 Esophagogastroduodenoscopy  03/11/2012    Procedure: ESOPHAGOGASTRODUODENOSCOPY (EGD);  Surgeon: Lafayette Dragon, MD;  Location: [facility] ENDOSCOPY;  Service: Endoscopy;  Laterality: N/A;  . Appendectomy  ~ 2000  . Lung biopsy  02/2012  . Dilation and curettage of uterus  2008  . Esophagogastroduodenoscopy N/A 03/07/2014    Procedure: ESOPHAGOGASTRODUODENOSCOPY (EGD);  Surgeon: Gatha Mayer, MD;  Location[facility] ENDOSCOPY;   Service: Endoscopy;  Laterality: N/A;   No family history on file. History  Substance Use Topics  . Smoking status: Never Smoker   . Smokeless tobacco: Never Used  . Alcohol Use: No     Comment: socially   OB History    Gravida Para Term Preterm AB TAB SAB Ectopic Multiple Living   1 0 0 0 1 0 1 0 0 0      Review of Systems  All other systems negative except as documented in the HPI. All pertinent positives and negatives as reviewed in the HPI.  Allergies  Review of patient's allergies indicates no known allergies.  Home Medications   Prior to Admission medications   Medication Sig Start Date End Date Taking? Authorizing Provider  Darunavir Ethanolate (PREZISTA) 800 MG tablet Take 1 tablet (800 mg total) by mouth daily. 01/11/15  Yes Carlyle Basques, MD  dronabinol (MARINOL) 5 MG capsule Take 1 capsule (5 mg total) by mouth 2 (two) times daily before a meal. As needed 12/20/14  Yes Carlyle Basques, MD  emtricitabine-tenofovir (TRUVADA) 200-300 MG per tablet Take 1 tablet by mouth daily. 01/11/15  Yes Carlyle Basques, MD  HYDROcodone-acetaminophen (NORCO/VICODIN) 5-325 MG per tablet Take 1-2 tablets by mouth every 6 (six) hours as needed for moderate pain. 09/03/14  Yes Mirna Mires, MD  ritonavir (NORVIR) 100 MG TABS tablet Take 1 tablet (100 mg total) by mouth daily. 01/11/15  Yes Carlyle Basques, MD  saccharomyces boulardii (FLORASTOR) 250 MG capsule Take 250 mg by mouth 2 (two) times daily.   Yes Historical Provider, MD  hydrocortisone (  CORTEF) 5 MG tablet Take 2 tablets in the morning and 1 tablet in the afternoon, no later than 6 PM 11/28/14   Philemon Kingdom, MD  ibuprofen (ADVIL,MOTRIN) 600 MG tablet Take 600 mg by mouth every 6 (six) hours as needed for moderate pain.    Historical Provider, MD  medroxyPROGESTERone (DEPO-PROVERA) 150 MG/ML injection Inject 150 mg into the muscle every 3 (three) months.    Historical Provider, MD  Multiple Vitamin (MULTIVITAMIN WITH MINERALS) TABS  Take 1 tablet by mouth daily.    Historical Provider, MD   BP 126/85 mmHg  Pulse 78  Temp(Src) 98.1 F (36.7 C) (Oral)  Resp 18  SpO2 100% Physical Exam  Constitutional: She is oriented to person, place, and time. She appears well-developed and well-nourished.  HENT:  Head: Normocephalic and atraumatic.  Mouth/Throat: Oropharynx is clear and moist.  Eyes: EOM are normal. Pupils are equal, round, and reactive to light.  Neck: Normal range of motion. Neck supple.  Cardiovascular: Normal rate, regular rhythm and normal heart sounds.  Exam reveals no gallop and no friction rub.   No murmur heard. Pulmonary/Chest: Effort normal and breath sounds normal.  Neurological: She is alert and oriented to person, place, and time. She exhibits normal muscle tone. Coordination normal.  Skin: Skin is warm and dry. No rash noted. No erythema.  Nursing note and vitals reviewed.   ED Course  Procedures (including critical care time) Labs Review Labs Reviewed  URINALYSIS, ROUTINE W REFLEX MICROSCOPIC - Abnormal; Notable for the following:    Leukocytes, UA TRACE (*)    All other components within normal limits  BASIC METABOLIC PANEL - Abnormal; Notable for the following:    BUN <5 (*)    Anion gap 3 (*)    All other components within normal limits  CBC WITH DIFFERENTIAL/PLATELET - Abnormal; Notable for the following:    RDW 17.8 (*)    All other components within normal limits  URINE MICROSCOPIC-ADD ON - Abnormal; Notable for the following:    Squamous Epithelial / LPF FEW (*)    Bacteria, UA FEW (*)    All other components within normal limits  POC URINE PREG, ED   Patient be treated for vertigo and advised follow-up with her primary care Dr. told to return here as needed.  She is also complaining of lower back pain that radiates into her left leg.  This been ongoing for several months.  She states that her primary care doctor did not address this issue as he was focused on her vertigo.  The  patient most likely has lumbar radiculopathy.  Told to follow up with her doctor on this issue as well.  Patient is feeling some better following the meclizine.  She was given IV fluids as well  MDM   Final diagnoses:  None       Brent General, PA-C 01/30/15 1352  Charlesetta Shanks, MD 02/01/15 2008

## 2015-01-30 NOTE — Discharge Instructions (Signed)
Return here as needed.  Follow-up with your primary care doctor, increase your fluid intake °

## 2015-01-30 NOTE — ED Notes (Signed)
Pt is from home. Complaint of dizziness x 3 weeks. Was seen by PCP last week and referred to neurologist, to be seen March 4th. Pt. States dizziness is intermittent and feels like the room is spinning. Pt. States she is nauseous sometimes.

## 2015-02-04 ENCOUNTER — Encounter (HOSPITAL_COMMUNITY): Payer: Self-pay | Admitting: Emergency Medicine

## 2015-02-04 ENCOUNTER — Emergency Department (HOSPITAL_COMMUNITY)
Admission: EM | Admit: 2015-02-04 | Discharge: 2015-02-04 | Disposition: A | Discharge status: Home / Self Care | Payer: 59 | Attending: Emergency Medicine | Admitting: Emergency Medicine

## 2015-02-04 ENCOUNTER — Emergency Department (HOSPITAL_COMMUNITY): Payer: 59

## 2015-02-04 DIAGNOSIS — Z3202 Encounter for pregnancy test, result negative: Secondary | ICD-10-CM | POA: Diagnosis not present

## 2015-02-04 DIAGNOSIS — Z7952 Long term (current) use of systemic steroids: Secondary | ICD-10-CM | POA: Diagnosis not present

## 2015-02-04 DIAGNOSIS — G8929 Other chronic pain: Secondary | ICD-10-CM | POA: Diagnosis not present

## 2015-02-04 DIAGNOSIS — M79606 Pain in leg, unspecified: Secondary | ICD-10-CM

## 2015-02-04 DIAGNOSIS — Z8619 Personal history of other infectious and parasitic diseases: Secondary | ICD-10-CM | POA: Diagnosis not present

## 2015-02-04 DIAGNOSIS — Z21 Asymptomatic human immunodeficiency virus [HIV] infection status: Secondary | ICD-10-CM | POA: Diagnosis not present

## 2015-02-04 DIAGNOSIS — M25552 Pain in left hip: Secondary | ICD-10-CM

## 2015-02-04 DIAGNOSIS — Z793 Long term (current) use of hormonal contraceptives: Secondary | ICD-10-CM | POA: Diagnosis not present

## 2015-02-04 DIAGNOSIS — M5416 Radiculopathy, lumbar region: Secondary | ICD-10-CM | POA: Insufficient documentation

## 2015-02-04 DIAGNOSIS — Z872 Personal history of diseases of the skin and subcutaneous tissue: Secondary | ICD-10-CM | POA: Insufficient documentation

## 2015-02-04 DIAGNOSIS — Z8719 Personal history of other diseases of the digestive system: Secondary | ICD-10-CM | POA: Insufficient documentation

## 2015-02-04 DIAGNOSIS — M79605 Pain in left leg: Secondary | ICD-10-CM | POA: Diagnosis present

## 2015-02-04 DIAGNOSIS — Z862 Personal history of diseases of the blood and blood-forming organs and certain disorders involving the immune mechanism: Secondary | ICD-10-CM | POA: Insufficient documentation

## 2015-02-04 DIAGNOSIS — Z79899 Other long term (current) drug therapy: Secondary | ICD-10-CM | POA: Insufficient documentation

## 2015-02-04 DIAGNOSIS — Z87828 Personal history of other (healed) physical injury and trauma: Secondary | ICD-10-CM | POA: Diagnosis not present

## 2015-02-04 HISTORY — DX: Dizziness and giddiness: R42

## 2015-02-04 HISTORY — DX: Radiculopathy, lumbar region: M54.16

## 2015-02-04 HISTORY — DX: Pain in leg, unspecified: M79.606

## 2015-02-04 HISTORY — DX: Other fatigue: R53.83

## 2015-02-04 HISTORY — DX: Dorsalgia, unspecified: M54.9

## 2015-02-04 HISTORY — DX: Other chronic pain: G89.29

## 2015-02-04 LAB — POC URINE PREG, ED: Preg Test, Ur: NEGATIVE

## 2015-02-04 MED ORDER — PROMETHAZINE HCL 25 MG PO TABS: 25.0000 mg | ORAL_TABLET | Freq: Three times a day (TID) | ORAL | Status: DC | PRN

## 2015-02-04 MED ORDER — OXYCODONE-ACETAMINOPHEN 5-325 MG PO TABS
1.0000 | ORAL_TABLET | Freq: Once | ORAL | Status: AC
  Administered 2015-02-04: 1 via ORAL
  Filled 2015-02-04: qty 1

## 2015-02-04 NOTE — Discharge Instructions (Signed)
Please call your doctor for a followup appointment within 24-48 hours. When you talk to your doctor please let them know that you were seen in the emergency department and have them acquire all of your records so that they can discuss the findings with you and formulate a treatment plan to fully care for your new and ongoing problems. Please call and set-up an appointment with your primary care provider Please follow up with Neurology regarding continuous vertigo Please call and set-up an appointment with Orthopedics Please rest and stay hydrated Please avoid any strenuous activity or heavy lifting Please continue to take at home medications as prescribed Please continue to monitor symptoms closely and if symptoms are to worsen or change (fever greater than 101, chills, sweating, nausea, vomiting, chest pain, shortness of breathe, difficulty breathing, weakness, numbness, tingling, worsening or changes to pain pattern, fall, injury, loss of sensation, dragging of the leg, changes to skin color, swelling, joint is hot to touch) please report back to the Emergency Department immediately.   Hip Pain Your hip is the joint between your upper legs and your lower pelvis. The bones, cartilage, tendons, and muscles of your hip joint perform a lot of work each day supporting your body weight and allowing you to move around. Hip pain can range from a minor ache to severe pain in one or both of your hips. Pain may be felt on the inside of the hip joint near the groin, or the outside near the buttocks and upper thigh. You may have swelling or stiffness as well.  HOME CARE INSTRUCTIONS   Take medicines only as directed by your health care provider.  Apply ice to the injured area:  Put ice in a plastic bag.  Place a towel between your skin and the bag.  Leave the ice on for 15-20 minutes at a time, 3-4 times a day.  Keep your leg raised (elevated) when possible to lessen swelling.  Avoid activities that  cause pain.  Follow specific exercises as directed by your health care provider.  Sleep with a pillow between your legs on your most comfortable side.  Record how often you have hip pain, the location of the pain, and what it feels like. SEEK MEDICAL CARE IF:   You are unable to put weight on your leg.  Your hip is red or swollen or very tender to touch.  Your pain or swelling continues or worsens after 1 week.  You have increasing difficulty walking.  You have a fever. SEEK IMMEDIATE MEDICAL CARE IF:   You have fallen.  You have a sudden increase in pain and swelling in your hip. MAKE SURE YOU:   Understand these instructions.  Will watch your condition.  Will get help right away if you are not doing well or get worse. Document Released: 05/21/2010 Document Revised: 04/17/2014 Document Reviewed: 07/28/2013 [facility] Patient Information 2015 [place], [place]. This information is not intended to replace advice given to you by your health care provider. Make sure you discuss any questions you have with your health care provider.  Sciatica Sciatica is pain, weakness, numbness, or tingling along the path of the sciatic nerve. The nerve starts in the lower back and runs down the back of each leg. The nerve controls the muscles in the lower leg and in the back of the knee, while also providing sensation to the back of the thigh, lower leg, and the sole of your foot. Sciatica is a symptom of another medical condition. For  instance, nerve damage or certain conditions, such as a herniated disk or bone spur on the spine, pinch or put pressure on the sciatic nerve. This causes the pain, weakness, or other sensations normally associated with sciatica. Generally, sciatica only affects one side of the body. CAUSES   Herniated or slipped disc.  Degenerative disk disease.  A pain disorder involving the narrow muscle in the buttocks (piriformis syndrome).  Pelvic injury or  fracture.  Pregnancy.  Tumor (rare). SYMPTOMS  Symptoms can vary from mild to very severe. The symptoms usually travel from the low back to the buttocks and down the back of the leg. Symptoms can include:  Mild tingling or dull aches in the lower back, leg, or hip.  Numbness in the back of the calf or sole of the foot.  Burning sensations in the lower back, leg, or hip.  Sharp pains in the lower back, leg, or hip.  Leg weakness.  Severe back pain inhibiting movement. These symptoms may get worse with coughing, sneezing, laughing, or prolonged sitting or standing. Also, being overweight may worsen symptoms. DIAGNOSIS  Your caregiver will perform a physical exam to look for common symptoms of sciatica. He or she may ask you to do certain movements or activities that would trigger sciatic nerve pain. Other tests may be performed to find the cause of the sciatica. These may include:  Blood tests.  X-rays.  Imaging tests, such as an MRI or CT scan. TREATMENT  Treatment is directed at the cause of the sciatic pain. Sometimes, treatment is not necessary and the pain and discomfort goes away on its own. If treatment is needed, your caregiver may suggest:  Over-the-counter medicines to relieve pain.  Prescription medicines, such as anti-inflammatory medicine, muscle relaxants, or narcotics.  Applying heat or ice to the painful area.  Steroid injections to lessen pain, irritation, and inflammation around the nerve.  Reducing activity during periods of pain.  Exercising and stretching to strengthen your abdomen and improve flexibility of your spine. Your caregiver may suggest losing weight if the extra weight makes the back pain worse.  Physical therapy.  Surgery to eliminate what is pressing or pinching the nerve, such as a bone spur or part of a herniated disk. HOME CARE INSTRUCTIONS   Only take over-the-counter or prescription medicines for pain or discomfort as directed by  your caregiver.  Apply ice to the affected area for 20 minutes, 3-4 times a day for the first 48-72 hours. Then try heat in the same way.  Exercise, stretch, or perform your usual activities if these do not aggravate your pain.  Attend physical therapy sessions as directed by your caregiver.  Keep all follow-up appointments as directed by your caregiver.  Do not wear high heels or shoes that do not provide proper support.  Check your mattress to see if it is too soft. A firm mattress may lessen your pain and discomfort. SEEK IMMEDIATE MEDICAL CARE IF:   You lose control of your bowel or bladder (incontinence).  You have increasing weakness in the lower back, pelvis, buttocks, or legs.  You have redness or swelling of your back.  You have a burning sensation when you urinate.  You have pain that gets worse when you lie down or awakens you at night.  Your pain is worse than you have experienced in the past.  Your pain is lasting longer than 4 weeks.  You are suddenly losing weight without reason. MAKE SURE YOU:  Understand these instructions.  Will watch your condition.  Will get help right away if you are not doing well or get worse. Document Released: 11/25/2001 Document Revised: 06/01/2012 Document Reviewed: 04/11/2012 [facility] Patient Information 2015 [place], [place]. This information is not intended to replace advice given to you by your health care provider. Make sure you discuss any questions you have with your health care provider.

## 2015-02-04 NOTE — ED Provider Notes (Signed)
CSN: [id number]     Arrival date & time 02/04/15  0347 History   First MD Initiated Contact with Patient 02/04/15 [phone]     Chief Complaint  Patient presents with  . Leg Pain     (Consider location/radiation/quality/duration/timing/severity/associated sxs/prior Treatment) The history is provided by the patient. No language interpreter was used.  Kelsey Rodriguez is a 24 y/o F with PMHx of HIV, TB, Herpes, reflux, bullae presenting to the ED with left leg pain that has been ongoing for approximately one month. Patient reported that the pain is in her left hip with radiation to her left knee - stated that it is a deep ache with worsening upon applying pressure to the leg and when walking. Patient reported that she was seen in the ED a couple of days ago regarding her left leg pain - stated that she was prescribed Vicodin and prednisone - stated that this has not helped her pain. Denied loss of sensation, numbness, tingling, travels, leg swelling, changes to skin colored, hot to touch, fall, injury, urinary bowel incontinence, back pain, fever. PCP Dr. Graylon Good  Past Medical History  Diagnosis Date  . Tuberculosis   . HIV (human immunodeficiency virus infection) 02/2012  . Pelvic pain   . 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 Bell's palsy 08/26/2013  . Anemia of chronic disease 03/11/2012  . 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 Laceration of ankle, right 11/18/2012  . Reflux esophagitis 03/11/2012  . 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 Chronic headache   . Vertigo   . Chronic back pain   . Lumbar radiculopathy   . Chronic leg pain     bilateral knees, ankles  . Fatigue    Past Surgical History  Procedure Laterality Date  . Esophagogastroduodenoscopy  03/11/2012    Procedure: ESOPHAGOGASTRODUODENOSCOPY (EGD);  Surgeon: Lafayette Dragon, MD;  Location: [facility] ENDOSCOPY;  Service: Endoscopy;  Laterality: N/A;  . Appendectomy  ~ 2000  . Lung biopsy  02/2012  .  Dilation and curettage of uterus  2008  . Esophagogastroduodenoscopy N/A 03/07/2014    Procedure: ESOPHAGOGASTRODUODENOSCOPY (EGD);  Surgeon: Gatha Mayer, MD;  Location: [facility] ENDOSCOPY;  Service: Endoscopy;  Laterality: N/A;   No family history on file. History  Substance Use Topics  . Smoking status: Never Smoker   . Smokeless tobacco: Never Used  . Alcohol Use: No     Comment: socially   OB History    Gravida Para Term Preterm AB TAB SAB Ectopic Multiple Living   1 0 0 0 1 0 1 0 0 0      Review of Systems  Constitutional: Negative for fever and chills.  Respiratory: Negative for shortness of breath.   Cardiovascular: Negative for chest pain.  Musculoskeletal: Positive for myalgias and arthralgias (left hip). Negative for neck pain and neck stiffness.  Neurological: Negative for weakness and numbness.      Allergies  Review of patient's allergies indicates no known allergies.  Home Medications   Prior to Admission medications   Medication Sig Start Date End Date Taking? Authorizing Provider  Darunavir Ethanolate (PREZISTA) 800 MG tablet Take 1 tablet (800 mg total) by mouth daily. 01/11/15  Yes Carlyle Basques, MD  dronabinol (MARINOL) 5 MG capsule Take 1 capsule (5 mg total) by mouth 2 (two) times daily before a meal. As needed Patient taking differently: Take 5 mg by mouth 2 (two) times daily as needed (a). As needed 12/20/14  Yes Carlyle Basques,  MD  emtricitabine-tenofovir (TRUVADA) 200-300 MG per tablet Take 1 tablet by mouth daily. 01/11/15  Yes Carlyle Basques, MD  HYDROcodone-acetaminophen (NORCO/VICODIN) 5-325 MG per tablet Take 1 tablet by mouth every 6 (six) hours as needed for moderate pain. 01/30/15  Yes Resa Miner Lawyer, PA-C  hydrocortisone (CORTEF) 5 MG tablet Take 2 tablets in the morning and 1 tablet in the afternoon, no later than 6 PM 11/28/14  Yes Philemon Kingdom, MD  ibuprofen (ADVIL,MOTRIN) 600 MG tablet Take 600 mg by mouth every 6 (six) hours as needed  for moderate pain.   Yes Historical Provider, MD  meclizine (ANTIVERT) 25 MG tablet Take 1 tablet (25 mg total) by mouth 3 (three) times daily as needed. Patient taking differently: Take 25 mg by mouth 3 (three) times daily as needed for dizziness or nausea.  01/30/15  Yes Resa Miner Lawyer, PA-C  medroxyPROGESTERone (DEPO-PROVERA) 150 MG/ML injection Inject 150 mg into the muscle every 3 (three) months.   Yes Historical Provider, MD  Probiotic Product (PROBIOTIC DAILY PO) Take 2 tablets by mouth 2 (two) times daily. gummy   Yes Historical Provider, MD  ritonavir (NORVIR) 100 MG TABS tablet Take 1 tablet (100 mg total) by mouth daily. 01/11/15  Yes Carlyle Basques, MD  Multiple Vitamin (MULTIVITAMIN WITH MINERALS) TABS Take 1 tablet by mouth daily.    Historical Provider, MD  predniSONE (DELTASONE) 50 MG tablet Take 1 tablet (50 mg total) by mouth daily. Patient not taking: Reported on 02/04/2015 01/30/15   Resa Miner Lawyer, PA-C  promethazine (PHENERGAN) 25 MG tablet Take 1 tablet (25 mg total) by mouth every 8 (eight) hours as needed for nausea. 02/04/15   Kaipo Ardis, PA-C  saccharomyces boulardii (FLORASTOR) 250 MG capsule Take by mouth 2 (two) times daily.     Historical Provider, MD   BP 128/79 mmHg  Pulse 76  Temp(Src) 97.8 F (36.6 C) (Oral)  Resp 17  Ht 5\' 4"  (1.626 m)  Wt 165 lb (74.844 kg)  BMI 28.31 kg/m2  SpO2 100% Physical Exam  Constitutional: She is oriented to person, place, and time. She appears well-developed and well-nourished. No distress.  HENT:  Head: Normocephalic and atraumatic.  Eyes: Conjunctivae and EOM are normal. Pupils are equal, round, and reactive to light. Right eye exhibits no discharge. Left eye exhibits no discharge.  Neck: Normal range of motion. Neck supple.  Cardiovascular: Normal rate, regular rhythm and normal heart sounds.  Exam reveals no friction rub.   No murmur heard. Pulses:      Radial pulses are 2+ on the right side, and 2+ on the  left side.       Dorsalis pedis pulses are 2+ on the right side, and 2+ on the left side.  Negative swelling or pitting edema noted to the lower extremities bilaterally   Pulmonary/Chest: Effort normal and breath sounds normal. No respiratory distress. She has no wheezes. She has no rales.  Musculoskeletal: Normal range of motion. She exhibits tenderness.       Left hip: She exhibits tenderness. She exhibits normal range of motion, normal strength, no bony tenderness, no swelling, no crepitus and no deformity.       Left knee: She exhibits normal range of motion, no swelling, no effusion, no ecchymosis, no deformity, no laceration, no erythema and normal alignment. Tenderness found. Patellar tendon tenderness noted. No medial joint line, no lateral joint line, no MCL and no LCL tenderness noted.       Legs: Negative swelling,  erythema, inflammation, lesions, sores, deformities noted to the left leg. Negative warmth upon palpation. Full ROM to the LLE without difficulty or ataxia.   Neurological: She is alert and oriented to person, place, and time. No cranial nerve deficit. She exhibits normal muscle tone. Coordination normal.  Reflex Scores:      Patellar reflexes are 2+ on the right side and 2+ on the left side. Skin: Skin is warm and dry. No rash noted. She is not diaphoretic. No erythema.  Psychiatric: She has a normal mood and affect. Her behavior is normal. Thought content normal.  Nursing note and vitals reviewed.   ED Course  Procedures (including critical care time)  Results for orders placed or performed during the hospital encounter of 02/04/15  POC urine preg, ED (not at [facility])  Result Value Ref Range   Preg Test, Ur NEGATIVE NEGATIVE   Dg Knee Complete 4 Views Left  02/04/2015   CLINICAL DATA:  Left knee pain  EXAM: LEFT KNEE - COMPLETE 4+ VIEW  COMPARISON:  None.  FINDINGS: There is no evidence of fracture, dislocation, or joint effusion. There is no evidence of arthropathy or  other focal bone abnormality. Soft tissues are unremarkable.  IMPRESSION: Negative.   Electronically Signed   By: Kerby Moors M.D.   On: 02/04/2015 08:17   Dg Hip Unilat With Pelvis 2-3 Views Left  02/04/2015   CLINICAL DATA:  Acute left hip pain radiating to the knee for 4 weeks.  EXAM: LEFT HIP (WITH PELVIS) 2-3 VIEWS  COMPARISON:  02/04/2015  FINDINGS: Normal left hip alignment without acute osseous finding or fracture. Hips are symmetric. No degenerative process or arthropathy. Bony pelvis intact. No diastases. Normal SI joints. Left hemipelvis venous phleboliths noted. Normal bowel gas pattern.  IMPRESSION: No acute finding.   Electronically Signed   By: Jerilynn Mages.  Shick M.D.   On: 02/04/2015 08:17   VASCULAR LAB PRELIMINARY PRELIMINARY PRELIMINARY PRELIMINARY  Left lower extremity venous Doppler completed.   Preliminary report: There is no DVT or SVT noted in the left lower extremity.   KANADY, CANDACE, RVT 02/04/2015, 9:46 AM   Labs Review Labs Reviewed  POC URINE PREG, ED    Imaging Review Dg Knee Complete 4 Views Left  02/04/2015   CLINICAL DATA:  Left knee pain  EXAM: LEFT KNEE - COMPLETE 4+ VIEW  COMPARISON:  None.  FINDINGS: There is no evidence of fracture, dislocation, or joint effusion. There is no evidence of arthropathy or other focal bone abnormality. Soft tissues are unremarkable.  IMPRESSION: Negative.   Electronically Signed   By: Kerby Moors M.D.   On: 02/04/2015 08:17   Dg Hip Unilat With Pelvis 2-3 Views Left  02/04/2015   CLINICAL DATA:  Acute left hip pain radiating to the knee for 4 weeks.  EXAM: LEFT HIP (WITH PELVIS) 2-3 VIEWS  COMPARISON:  02/04/2015  FINDINGS: Normal left hip alignment without acute osseous finding or fracture. Hips are symmetric. No degenerative process or arthropathy. Bony pelvis intact. No diastases. Normal SI joints. Left hemipelvis venous phleboliths noted. Normal bowel gas pattern.  IMPRESSION: No acute finding.   Electronically  Signed   By: Jerilynn Mages.  Shick M.D.   On: 02/04/2015 08:17     EKG Interpretation None       9:36 AM patient ambulated well in the hallway without difficulty, ataxia.   MDM   Final diagnoses:  Leg pain  Left hip pain  Lumbar radiculopathy    Medications  oxyCODONE-acetaminophen (PERCOCET/ROXICET)  5-325 MG per tablet 1 tablet (1 tablet Oral Given 02/04/15 0859)    Filed Vitals:   02/04/15 0558 02/04/15 0600 02/04/15 0615 02/04/15 0717  BP: 154/89 130/88 128/89 128/79  Pulse: 100 93 88 76  Temp: 97.8 F (36.6 C)     TempSrc: Oral     Resp: 18   17  Height: 5\' 4"  (1.626 m)     Weight: 165 lb (74.844 kg)     SpO2: 100% 100% 100% 100%   This provider reviewed patient's chart. Patient had labs performed on 01/23/2015 with T upper cells at 650 and viral load less than 20. Patient was seen and assessed in ED setting on 01/30/2015 regarding dizziness, patient has a  history of vertigo was discharged with Antivert. This provider reviewed the patient's chart has been having leg complaints since 2015.  Urine pregnancy negative. Plain film of left hip/pelvis and left knee negative for acute osseous injury. Doppler performed of left lower extremity with negative findings of DVT or SVT.  Negative focal neurological deficits. Pulses palpable and strong. Full range of motion intact. Gait proper with-negative step-offs or sway. Sensation intact. Negative signs of ischemia. Imaging unremarkable for acute osseous injury. Negative findings of DVT. Doubt septic joint. Based on previous notes the patient's history, this appears to be a chronic ongoing issue. Suspicion to be possible arthritis. Patient stable, afebrile. Patient not septic appearing. Discharged patient. Referred patient to PCP and orthopedics. Discussed with patient to rest and stay hydrated. Discussed with patient to closely monitor symptoms and if symptoms are to worsen or change to report back to the ED - strict return instructions given.   Patient agreed to plan of care, understood, all questions answered.   Jamse Mead, PA-C 02/04/15 1003  10:07 AM Upon discharge patient reported that she has been having continuous dizziness since she was seen on 01/30/2015. This provider reviewed the patient's labs were performed that day as well as note - labs and vitals reassuring - patient diagnosed with vertigo. While in ED setting patient is been stable. Patient is been walking around the ED without any form of ataxic motions. Neurological exam unremarkable. Discussed with attending physician, Dr. Towanda Malkin, recommended phenergan to be administered and for patient to follow up with PCP - did not recommend any labs at this time. Educated patient on vertigo and referred to neurology as well.   Jamse Mead, PA-C 02/04/15 [place], MD 02/04/15 [id number]

## 2015-02-04 NOTE — Progress Notes (Signed)
VASCULAR LAB PRELIMINARY  PRELIMINARY  PRELIMINARY  PRELIMINARY  Left lower extremity venous Doppler completed.    Preliminary report:  There is no DVT or SVT noted in the left lower extremity.   Nesha Counihan, RVT 02/04/2015, 9:46 AM

## 2015-02-04 NOTE — ED Notes (Signed)
Pt states she has left leg pain 10/10 that doesn't let her walk, pt had been seen on the ED last Tuesday for the same complain, prescriptions given, pt states no relief with those medications. Pt denies any fall or injury.

## 2015-02-12 ENCOUNTER — Encounter: Payer: Self-pay | Admitting: Infectious Diseases

## 2015-02-12 ENCOUNTER — Ambulatory Visit (INDEPENDENT_AMBULATORY_CARE_PROVIDER_SITE_OTHER): Payer: 59 | Admitting: Infectious Diseases

## 2015-02-12 VITALS — BP 146/92 | HR 92 | Temp 98.8°F | Ht 64.0 in | Wt 161.0 lb

## 2015-02-12 DIAGNOSIS — B20 Human immunodeficiency virus [HIV] disease: Secondary | ICD-10-CM

## 2015-02-12 DIAGNOSIS — Z21 Asymptomatic human immunodeficiency virus [HIV] infection status: Secondary | ICD-10-CM

## 2015-02-12 DIAGNOSIS — M5430 Sciatica, unspecified side: Secondary | ICD-10-CM

## 2015-02-12 DIAGNOSIS — M5440 Lumbago with sciatica, unspecified side: Secondary | ICD-10-CM

## 2015-02-12 DIAGNOSIS — R42 Dizziness and giddiness: Secondary | ICD-10-CM

## 2015-02-12 DIAGNOSIS — M545 Low back pain: Secondary | ICD-10-CM

## 2015-02-12 DIAGNOSIS — M5432 Sciatica, left side: Secondary | ICD-10-CM | POA: Insufficient documentation

## 2015-02-12 HISTORY — DX: Lumbago with sciatica, unspecified side: M54.40

## 2015-02-12 NOTE — Assessment & Plan Note (Signed)
Etiology of this is unclear. Will check MRI LS spine and hips. She has had TB prior so some concern that she may have spinal lesion. She seems young, with only 3 yrs ART exposure, AVN seems unlikely? Avoid narcotics. See her back in 3-4 weeks

## 2015-02-12 NOTE — Progress Notes (Signed)
   Subjective:    Patient ID: Kelsey Rodriguez, female    DOB: [birth year], 24 y.o.   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 [name] Has been on truvada/DRVr. Also hx of adrenal insuficiency on steroid replacement.  Was seen in ED twice since last ID visit (2-9). She was seen once for dizziness. She states that this is improving. She has CV appt 3-4.  She continues to have LLE pain. She was seen in ED 2-21 for this and had dopper (-) and plain films (-). Worse with walking. Worse with rising from sitting. Hurts from hip to knee. Pain with using leg. Was given vicodin by ED. Also having pain in her lower back. Has had some tingling sensation in her LLE.  No problems with ART. On depo shot.   HIV 1 RNA QUANT (copies/mL)  Date Value  01/23/2015 <20  10/24/2014 45*  06/26/2014 <20   CD4 T CELL ABS (/uL)  Date Value  01/23/2015 650  10/24/2014 610  06/26/2014 600   Review of Systems  Constitutional: Negative for fever and chills.  Respiratory: Negative for shortness of breath.   Cardiovascular: Negative for chest pain.  Gastrointestinal: Negative for diarrhea and constipation.  Genitourinary: Negative for frequency, difficulty urinating and menstrual problem.  Neurological: Positive for dizziness.      Objective:   Physical Exam  Constitutional: She appears well-developed and well-nourished.  HENT:  Mouth/Throat: No oropharyngeal exudate.  Eyes: EOM are normal. Pupils are equal, round, and reactive to light.  Neck: Neck supple.  Cardiovascular: Normal rate, regular rhythm and normal heart sounds.   Pulmonary/Chest: Effort normal and breath sounds normal.  Abdominal: Soft. Bowel sounds are normal. She exhibits no distension. There is no tenderness.  Musculoskeletal: Normal range of motion.  Mild tenderness in R lower back, hip.   Lymphadenopathy:    She has no cervical adenopathy.    Neurological: No cranial nerve deficit or sensory deficit. She exhibits normal muscle tone.       Assessment & Plan:

## 2015-02-12 NOTE — Assessment & Plan Note (Signed)
She is doing well.  Has gotten flu shot.  Offered/refused condoms.  Has gotten 1 hep B shot.  Has gotten flu shot.

## 2015-02-12 NOTE — Assessment & Plan Note (Signed)
Greatly appreciate CV eval.

## 2015-02-16 ENCOUNTER — Ambulatory Visit (INDEPENDENT_AMBULATORY_CARE_PROVIDER_SITE_OTHER): Payer: 59 | Admitting: Cardiology

## 2015-02-16 ENCOUNTER — Encounter: Payer: Self-pay | Admitting: Cardiology

## 2015-02-16 VITALS — BP 122/78 | HR 92 | Ht 63.5 in | Wt 163.9 lb

## 2015-02-16 DIAGNOSIS — R002 Palpitations: Secondary | ICD-10-CM

## 2015-02-16 NOTE — Progress Notes (Signed)
Cardiology Office Note   Date:  02/16/2015   ID:  Kelsey Rodriguez, DOB 07-10-1991, MRN [id number]  PCP:  Kelsey Basques, MD  Cardiologist:   Kelsey Breeding, MD   Chief Complaint  Patient presents with  . Dizziness      History of Present Illness: Kelsey Rodriguez is a 24 y.o. female who presents for evaluation of dizziness. She says this has been going on for some time. She says it was happening 2 or 3 times per day. He would typically be while she was standing or sitting. It wouldn't happen so much when she was lying down. She doesn't think it was positional. It might last for a couple seconds. She might feel her heart palpating. Now is happening about every 3 or 4 days. She did go to the emergency room on the 16th of last month and I reviewed these records. She was treated for vertigo but she doesn't think this has helped. She's not had any frank syncope. Her only cardiac workup was an echocardiogram in October of last year and I reviewed this. Was unremarkable. She's not otherwise had any cardiac problems. She does get chest discomfort. This is left upper anterior chest radiating around to her back. She does not have any jaw discomfort. This happens sporadically and is not associated with activities. It's fleeting and can be 10 out of 10. She has been told before that this has been related to nerve dysfunction that she had after lung biopsy.    Past Medical History  Diagnosis Date  . Tuberculosis   . HIV (human immunodeficiency virus infection) 02/2012  . Pelvic pain   . 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 Bell's palsy 08/26/2013  . Anemia of chronic disease 03/11/2012  . 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 Laceration of ankle, right 11/18/2012  . Reflux esophagitis 03/11/2012  . 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 Chronic headache   . Vertigo   . Chronic back pain   . Lumbar radiculopathy   . Chronic leg pain     bilateral knees, ankles  .  Fatigue     Past Surgical History  Procedure Laterality Date  . Esophagogastroduodenoscopy  03/11/2012    Procedure: ESOPHAGOGASTRODUODENOSCOPY (EGD);  Surgeon: Lafayette Dragon, MD;  Location: [facility] ENDOSCOPY;  Service: Endoscopy;  Laterality: N/A;  . Appendectomy  ~ 2000  . Lung biopsy  02/2012  . Dilation and curettage of uterus  2008  . Esophagogastroduodenoscopy N/A 03/07/2014    Procedure: ESOPHAGOGASTRODUODENOSCOPY (EGD);  Surgeon: Gatha Mayer, MD;  Location: [facility] ENDOSCOPY;  Service: Endoscopy;  Laterality: N/A;     Current Outpatient Prescriptions  Medication Sig Dispense Refill  . Darunavir Ethanolate (PREZISTA) 800 MG tablet Take 1 tablet (800 mg total) by mouth daily. 30 tablet 3  . dronabinol (MARINOL) 5 MG capsule Take 1 capsule (5 mg total) by mouth 2 (two) times daily before a meal. As needed 30 capsule 0  . emtricitabine-tenofovir (TRUVADA) 200-300 MG per tablet Take 1 tablet by mouth daily. 30 tablet 3  . HYDROcodone-acetaminophen (NORCO/VICODIN) 5-325 MG per tablet Take 1 tablet by mouth every 6 (six) hours as needed for moderate pain. 15 tablet 0  . hydrocortisone (CORTEF) 5 MG tablet Take 2 tablets in the morning and 1 tablet in the afternoon, no later than 6 PM 100 tablet 2  . ibuprofen (ADVIL,MOTRIN) 600 MG tablet Take 600 mg by mouth every 6 (six) hours as needed for moderate  pain.    . meclizine (ANTIVERT) 25 MG tablet Take 1 tablet (25 mg total) by mouth 3 (three) times daily as needed. (Patient taking differently: Take 25 mg by mouth 3 (three) times daily as needed for dizziness or nausea. ) 28 tablet 0  . medroxyPROGESTERone (DEPO-PROVERA) 150 MG/ML injection Inject 150 mg into the muscle every 3 (three) months.    . Multiple Vitamin (MULTIVITAMIN WITH MINERALS) TABS Take 1 tablet by mouth daily.    . Probiotic Product (PROBIOTIC DAILY PO) Take 2 tablets by mouth 2 (two) times daily. gummy    . promethazine (PHENERGAN) 25 MG tablet Take 1 tablet (25 mg total) by mouth  every 8 (eight) hours as needed for nausea. 11 tablet 0  . ritonavir (NORVIR) 100 MG TABS tablet Take 1 tablet (100 mg total) by mouth daily. 30 tablet 3  . saccharomyces boulardii (FLORASTOR) 250 MG capsule Take by mouth 2 (two) times daily.     . predniSONE (DELTASONE) 50 MG tablet Take 2 tablets (10 mg total) by mouth every morning and take 1 tablet (50 mg total) by mouth every evening.  0   No current facility-administered medications for this visit.    Allergies:   Review of patient's allergies indicates no known allergies.    Social History:  The patient  reports that she has never smoked. She has never used smokeless tobacco. She reports that she does not drink alcohol or use illicit drugs.   Family History:  The patient's family history includes Heart disease in her father.    ROS:  Please see the history of present illness.   Otherwise, review of systems are positive for headaches, decreased hearing, dyspnea, leg cramps and swelling.   All other systems are reviewed and negative.    PHYSICAL EXAM: VS:  BP 122/78 mmHg  Pulse 92  Ht 5' 3.5" (1.613 m)  Wt 163 lb 14.4 oz (74.345 kg)  BMI 28.57 kg/m2 , BMI Body mass index is 28.57 kg/(m^2). GENERAL:  Well appearing HEENT:  Pupils equal round and reactive, fundi not visualized, oral mucosa unremarkable NECK:  No jugular venous distention, waveform within normal limits, carotid upstroke brisk and symmetric, no bruits, no thyromegaly LYMPHATICS:  No cervical, inguinal adenopathy LUNGS:  Clear to auscultation bilaterally BACK:  No CVA tenderness CHEST:  Unremarkable HEART:  PMI not displaced or sustained,S1 and S2 within normal limits, no S3, no S4, no clicks, no rubs, no murmurs ABD:  Flat, positive bowel sounds normal in frequency in pitch, no bruits, no rebound, no guarding, no midline pulsatile mass, no hepatomegaly, no splenomegaly EXT:  2 plus pulses throughout, no edema, no cyanosis no clubbing SKIN:  No rashes no  nodules NEURO:  Cranial nerves II through XII grossly intact, motor grossly intact throughout PSYCH:  Cognitively intact, oriented to person place and time    EKG:  EKG is ordered today. The ekg ordered today demonstrates sinus rhythm, rate 92, axis within normal limits, intervals within normal limits, no acute ST-T changes.   Recent Labs: 09/15/2014: Pro B Natriuretic peptide (BNP) 135.7* 01/23/2015: ALT 11; TSH 0.810 01/30/2015: BUN <5*; Creatinine 0.75; Hemoglobin 12.0; Platelets 288; Potassium 3.6; Sodium 139    Lipid Panel    Component Value Date/Time   CHOL 208* 01/23/2015 1612   TRIG 88 01/23/2015 1612   HDL 54 01/23/2015 1612   CHOLHDL 3.9 01/23/2015 1612   VLDL 18 01/23/2015 1612   LDLCALC 136* 01/23/2015 1612      Wt  Readings from Last 3 Encounters:  02/16/15 163 lb 14.4 oz (74.345 kg)  02/12/15 161 lb (73.029 kg)  02/04/15 165 lb (74.844 kg)      Other studies Reviewed: Additional studies/ records that were reviewed today include:  Event recorder. Review of the above records demonstrates:  Please see elsewhere in the note.     ASSESSMENT AND PLAN:  DIZZINESS:  I don't strongly suspect a cardiac etiology.    I did review labs and her ER records. Her thyroid was normal.  There were no electrolyte abnormalities. She's not orthostatic in the office today. I will apply a 21 day event monitor. Domenique Quest will need a 21 day event monitor.  The patients symptoms necessitate an event monitor.  The symptoms are too infrequent to be identified on a Holter monitor.    CHEST PAIN:  This is atypical.     Current medicines are reviewed at length with the patient today.  The patient does not have concerns regarding medicines.  The following changes have been made:  no change  Labs/ tests ordered today include:   Event recorder.  No orders of the defined types were placed in this encounter.     Disposition:   FU with me as needed.      Signed, Kelsey Breeding, MD   02/16/2015 3:30 PM    [place]

## 2015-02-16 NOTE — Patient Instructions (Signed)
Your physician recommends that you schedule a follow-up appointment in: as needed and we will call you with the results of the monitor

## 2015-02-20 NOTE — Addendum Note (Signed)
Addended by: Vear Clock on: 02/20/2015 04:17 PM   Modules accepted: Orders

## 2015-02-22 ENCOUNTER — Telehealth: Payer: Self-pay | Admitting: *Deleted

## 2015-02-22 NOTE — Telephone Encounter (Signed)
Notified patient of appt for MRI of lumbar spine on 03/05/15 at 4:45 pm at [facility]. This was approved [place]. # [id number]. Have not received notification that MRI of left hip was approved; so this has not been scheduled at this time.

## 2015-03-05 ENCOUNTER — Ambulatory Visit (HOSPITAL_COMMUNITY)
Admission: RE | Admit: 2015-03-05 | Discharge: 2015-03-05 | Disposition: A | Discharge status: Home / Self Care | Payer: 59 | Source: Ambulatory Visit | Attending: Infectious Diseases | Admitting: Infectious Diseases

## 2015-03-05 DIAGNOSIS — Z8611 Personal history of tuberculosis: Secondary | ICD-10-CM | POA: Diagnosis not present

## 2015-03-05 DIAGNOSIS — Z21 Asymptomatic human immunodeficiency virus [HIV] infection status: Secondary | ICD-10-CM | POA: Diagnosis not present

## 2015-03-05 DIAGNOSIS — M4806 Spinal stenosis, lumbar region: Secondary | ICD-10-CM | POA: Diagnosis not present

## 2015-03-05 DIAGNOSIS — M5440 Lumbago with sciatica, unspecified side: Secondary | ICD-10-CM | POA: Diagnosis present

## 2015-03-05 MED ORDER — GADOBENATE DIMEGLUMINE 529 MG/ML IV SOLN
15.0000 mL | Freq: Once | INTRAVENOUS | Status: AC | PRN
  Administered 2015-03-05: 15 mL via INTRAVENOUS

## 2015-03-12 ENCOUNTER — Encounter: Payer: Self-pay | Admitting: Internal Medicine

## 2015-03-12 NOTE — Telephone Encounter (Signed)
Patient called for results of doppler. She has an appointment to establish with [facility]CP next week on 03/19/15. Advised patient to keep this appointment and the new MD may want to order additional testing as she is still c/o leg pain. Please advise on results of ABI, thanks. Myrtis Hopping

## 2015-03-14 ENCOUNTER — Ambulatory Visit (INDEPENDENT_AMBULATORY_CARE_PROVIDER_SITE_OTHER): Payer: 59 | Admitting: Internal Medicine

## 2015-03-14 VITALS — Wt 160.0 lb

## 2015-03-14 DIAGNOSIS — M5440 Lumbago with sciatica, unspecified side: Secondary | ICD-10-CM

## 2015-03-14 DIAGNOSIS — E2740 Unspecified adrenocortical insufficiency: Secondary | ICD-10-CM

## 2015-03-14 DIAGNOSIS — B20 Human immunodeficiency virus [HIV] disease: Secondary | ICD-10-CM

## 2015-03-14 MED ORDER — TRAMADOL HCL 50 MG PO TABS: 50.0000 mg | ORAL_TABLET | Freq: Four times a day (QID) | ORAL | Status: DC | PRN

## 2015-03-15 ENCOUNTER — Ambulatory Visit: Payer: Managed Care, Other (non HMO) | Admitting: Internal Medicine

## 2015-03-19 ENCOUNTER — Encounter: Payer: Self-pay | Admitting: Family Medicine

## 2015-03-19 ENCOUNTER — Ambulatory Visit (INDEPENDENT_AMBULATORY_CARE_PROVIDER_SITE_OTHER): Payer: 59 | Admitting: Family Medicine

## 2015-03-19 VITALS — BP 117/76 | HR 97 | Temp 100.2°F | Ht 64.0 in | Wt 159.8 lb

## 2015-03-19 DIAGNOSIS — Z21 Asymptomatic human immunodeficiency virus [HIV] infection status: Secondary | ICD-10-CM | POA: Diagnosis not present

## 2015-03-19 DIAGNOSIS — N921 Excessive and frequent menstruation with irregular cycle: Secondary | ICD-10-CM

## 2015-03-19 DIAGNOSIS — B20 Human immunodeficiency virus [HIV] disease: Secondary | ICD-10-CM

## 2015-03-19 DIAGNOSIS — N920 Excessive and frequent menstruation with regular cycle: Secondary | ICD-10-CM | POA: Insufficient documentation

## 2015-03-19 MED ORDER — MEGESTROL ACETATE 40 MG PO TABS: 80.0000 mg | ORAL_TABLET | Freq: Every day | ORAL | Status: DC

## 2015-03-19 NOTE — Patient Instructions (Addendum)
Make an appointment with me in 1 month to discuss bleeding. Take megace 80mg  once daily for 10 days.

## 2015-03-19 NOTE — Assessment & Plan Note (Addendum)
Due to progestin-only contraceptive (depo), likely self-limited. Pt would like medical therapy at this time. Rx megace x 10 days and follow up to include pelvic exam +/- pelvic ultrasound. Would order CBC but pt has no symptoms of anemia and lab is closed. Pt is clearly hemodynamically stable.

## 2015-03-19 NOTE — Progress Notes (Signed)
Subjective: Kelsey Rodriguez is a 24 y.o. female presenting to establish general primary care, also to discuss vaginal bleeding.   She emigrated from [place] 2011 to be reunited with her mother. English is her primary language. 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 She had previously registered for classes 2 years ago but canceled this because of her health problems.   Her primary doctors were at [facility] until now. They have managed HIV with HAART since diagnosis in 2013. They also manage her back pain. Her symptoms include back pain for many years that radiates down legs (L > R), vertigo (about once per week, sometimes causing falls). A cardiologist has cleared her from a cardiovascular standpoint regarding the falls.   [name] most pressing complaint is vaginal bleeding x 5 weeks. On depo injections for history of menorrhagia for about 1 year, but she reports breakthrough bleeding with light cramping intermittently. It helped initially but she reports the past 5 weeks they have returned. Last injection was last month at [facility]. Denies vaginal discharge, abd pain. Denies known history of fibroids. No personal or family history of other abnormal bleeding/bruising.   - Negative pap smear May 20, 2012. - Not sexually active. Has a history of a single pregnancy with early abortion in 2008. ([id number])   - Review of Systems: Per HPI. All other systems reviewed and are negative. - Past Medical/Surgical History: Reviewed and updated - Social History: Nonsmoker, EtOH 2-4 times per month (only 1 drink per occasion), no illicit drugs, PHQ-2 negative. Exercises by walking regularly.  - Family History: Not sure; possibly heart disease in father - Medications: Reviewed and updated - Allergies: NKDA  Objective: BP 117/76 mmHg  Pulse 97  Temp(Src) 100.2 F (37.9 C) (Oral)  Ht 5\' 4"  (1.626 m)  Wt 159 lb 12.8 oz (72.485 kg)   BMI 27.42 kg/m2 Gen: Well-appearing 24 y.o.female in NAD HEENT: Normocephalic, TMs normal bilaterally with clear effusion in L ear; sclerae/conjunctivae clear, PERRL, MMM, posterior oropharynx clear, good dentition Neck: neck supple, no masses appreciated; thyroid not enlarged  Pulm: Non-labored; CTAB, no wheezes  CV: Regular rate, no murmur appreciated; distal pulses intact/symmetric; no LE edema GI: Normoactive BS; soft, non-tender, non-distended, no HSM, no hernia GU: Deferred Lymph: No cervical, supraclavicular, or axillary lymphadenopathy Skin: No rashes, wounds, ulcers MSK: Normal gait and station; no digital clubbing/cyanosis, no frank joint deformity/effusion, full active ROM at neck and LS spine, + bony tenderness in sacral spine without paraspinal muscle spasm; straight leg raise negative bilaterally;  Neuro: CN II-XII without deficits, sensation intact to light touch, patellar DTRs 2+ bilaterally Psych: A&Ox3, mood euthymic with congruent affect, speech clear and coherent  Assessment/Plan: Kelsey Rodriguez is a 24 y.o. female establishing care.   Healthcare maintenance:  - Will need repeat pap smear 05/21/2015   See also problem based charting.

## 2015-03-19 NOTE — Assessment & Plan Note (Signed)
01/23/2015 VL: < 20; CD4 650.

## 2015-03-19 NOTE — Progress Notes (Signed)
Patient ID: Kelsey Rodriguez, female   DOB: 1991/02/20, 24 y.o.   MRN: [id number]       Patient ID: Kelsey Rodriguez, female   DOB: August 15, 1991, 24 y.o.   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 HIV disease is well controlled, CD 4 count of 650/VL<20 (feb 2016) on truavda-boosted darunavir regimen. She reports taking daily. She has had multiple medical complaints for the past 6- 9 months with various work ups. In December/January, she was found to have primary adrenal insufficiency to explain weight gain, and fatigue.   Most recently, she was evaluated for atypical chest pains/palpitations, where she was referred to cardiology and had 21d holter monitor which did not find anything of significance to think that her symptoms; thus, no relationship to cardiac origin. She also has subscribed to low back pain for which she had back MRI done that did not show significant abnormality.   She reports incurring significant amount of financial debt from her health care plan having high deductible. She has a Education officer, museum accompanying her today to help determine best course of action.  Patient's health has led to frequent absences at work, she is maxed out on time off. Secondly, only works 2 days per week ? Possibly due to overall health. She states that it is not sufficient to cover her bills.   She continues to have some weight gain due to inactivity.  Outpatient Encounter Prescriptions as of 03/14/2015  Medication Sig  . Darunavir Ethanolate (PREZISTA) 800 MG tablet Take 1 tablet (800 mg total) by mouth daily.  [name] emtricitabine-tenofovir (TRUVADA) 200-300 MG per tablet Take 1 tablet by mouth daily.  [name] ibuprofen (ADVIL,MOTRIN) 600 MG tablet Take 600 mg by mouth every 6 (six) hours as needed for moderate pain.  . medroxyPROGESTERone (DEPO-PROVERA) 150 MG/ML injection Inject 150 mg into the muscle every 3 (three) months.  . Probiotic Product  (PROBIOTIC DAILY PO) Take 2 tablets by mouth 2 (two) times daily. gummy  . ritonavir (NORVIR) 100 MG TABS tablet Take 1 tablet (100 mg total) by mouth daily.  [name] dronabinol (MARINOL) 5 MG capsule Take 1 capsule (5 mg total) by mouth 2 (two) times daily before a meal. As needed (Patient not taking: Reported on 03/14/2015)  . hydrocortisone (CORTEF) 5 MG tablet Take 2 tablets in the morning and 1 tablet in the afternoon, no later than 6 PM (Patient not taking: Reported on 03/14/2015)  . predniSONE (DELTASONE) 50 MG tablet Take 2 tablets (10 mg total) by mouth every morning and take 1 tablet (50 mg total) by mouth every evening.  . [DISCONTINUED] HYDROcodone-acetaminophen (NORCO/VICODIN) 5-325 MG per tablet Take 1 tablet by mouth every 6 (six) hours as needed for moderate pain. (Patient not taking: Reported on 03/14/2015)  . [DISCONTINUED] meclizine (ANTIVERT) 25 MG tablet Take 1 tablet (25 mg total) by mouth 3 (three) times daily as needed. (Patient taking differently: Take 25 mg by mouth 3 (three) times daily as needed for dizziness or nausea. )  . [DISCONTINUED] Multiple Vitamin (MULTIVITAMIN WITH MINERALS) TABS Take 1 tablet by mouth daily.  . [DISCONTINUED] promethazine (PHENERGAN) 25 MG tablet Take 1 tablet (25 mg total) by mouth every 8 (eight) hours as needed for nausea. (Patient not taking: Reported on 03/14/2015)  . [DISCONTINUED] saccharomyces boulardii (FLORASTOR) 250 MG capsule Take by mouth 2 (two) times daily.   . [DISCONTINUED] traMADol (ULTRAM) 50 MG tablet Take 1  tablet (50 mg total) by mouth every 6 (six) hours as needed.     Patient Active Problem List   Diagnosis Date Noted  . Menorrhagia 03/19/2015  . Back pain of lumbar region with sciatica 02/12/2015  . Dizziness and giddiness 01/23/2015  . Primary adrenal insufficiency 01/03/2015  . Fatigue 09/25/2014  . Chest pain 07/07/2013  . Laceration of ankle, right 11/18/2012  . 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 Vaginal Discharge 05/30/2012  .  Arthralgia 05/20/2012  . HIV (human immunodeficiency virus infection) 03/16/2012  . Dysphagia 03/11/2012  . 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 Anemia of chronic disease 03/11/2012  . Reflux esophagitis 03/11/2012     There are no preventive care reminders to display for this patient.   Review of Systems Back pain and atypical chest pain that she treats with nsaids. 10 point ros is otherwise negative Physical Exam  Wt 160 lb (72.576 kg) Physical Exam  Constitutional:  oriented to person, place, and time. appears well-developed and well-nourished. No distress.  HENT:  Mouth/Throat: Oropharynx is clear and moist. No oropharyngeal exudate.  Cardiovascular: Normal rate, regular rhythm and normal heart sounds. Exam reveals no gallop and no friction rub.  No murmur heard.  Pulmonary/Chest: Effort normal and breath sounds normal. No respiratory distress.  has no wheezes.  Abdominal: Soft. Bowel sounds are normal.  exhibits no distension. There is no tenderness.  Lymphadenopathy: no cervical adenopathy.  Neurological: alert and oriented to person, place, and time.  Skin: Skin is warm and dry. No rash noted. No erythema.  Psychiatric: a normal mood and affect.  behavior is normal.    Lab Results  Component Value Date   CD4TCELL 24* 01/23/2015   Lab Results  Component Value Date   CD4TABS 650 01/23/2015   CD4TABS 610 10/24/2014   CD4TABS 600 06/26/2014   Lab Results  Component Value Date   HIV1RNAQUANT <20 01/23/2015   Lab Results  Component Value Date   HEPBSAB NEGATIVE 03/13/2012   No results found for: RPR  CBC Lab Results  Component Value Date   WBC 6.2 01/30/2015   RBC 4.36 01/30/2015   HGB 12.0 01/30/2015   HCT 36.8 01/30/2015   PLT 288 01/30/2015   MCV 84.4 01/30/2015   MCH 27.5 01/30/2015   MCHC 32.6 01/30/2015   RDW 17.8* 01/30/2015   LYMPHSABS 2.5 01/30/2015   MONOABS 0.6 01/30/2015   EOSABS 0.2 01/30/2015   BASOSABS 0.0 01/30/2015    BMET Lab Results  Component Value Date   NA 139 01/30/2015   K 3.6 01/30/2015   CL 110 01/30/2015   CO2 26 01/30/2015   GLUCOSE 79 01/30/2015   BUN <5* 01/30/2015   CREATININE 0.75 01/30/2015   CALCIUM 9.3 01/30/2015   GFRNONAA >90 01/30/2015   GFRAA >90 01/30/2015     Assessment and Plan  hiv disease = well controlled, continue on current regimen  Adrenal insufficiency = she does not appear to be taking her hydrocortisone. Will ask her to follow up with Dr. Cruzita Lederer to determine what she is supposed to be taking  Atypical chest pain = reassured her that this discomfort is likely msk pain and not cardiac in origin  Back pain = imaging does not suggest process to intervene. Continue to treat symptomatically. Consider back strengthening excercises. Will give handout  Financial stressors = refer her and social worker to our financial coordinators to see which is best course of action. May need to go back to adap but then it  would limit access to other care. Vs. Reapply for medicaid

## 2015-03-20 ENCOUNTER — Encounter: Payer: Self-pay | Admitting: Infectious Diseases

## 2015-03-20 ENCOUNTER — Ambulatory Visit (INDEPENDENT_AMBULATORY_CARE_PROVIDER_SITE_OTHER): Payer: 59 | Admitting: Internal Medicine

## 2015-03-20 ENCOUNTER — Encounter: Payer: Self-pay | Admitting: Internal Medicine

## 2015-03-20 VITALS — BP 141/82 | HR 98 | Temp 100.5°F | Wt 158.5 lb

## 2015-03-20 DIAGNOSIS — J302 Other seasonal allergic rhinitis: Secondary | ICD-10-CM | POA: Diagnosis not present

## 2015-03-20 NOTE — Progress Notes (Signed)
   Subjective:    Patient ID: Kelsey Rodriguez, female    DOB: Apr 28, 1991, 24 y.o.   MRN: [id number]  HPI She comes in for a work in visit. She reports of fever that she has had for about 4 days. No associated symptoms including no cough, no shortness of breath, no new headache, no diarrhea, no dysuria, no ear pain, nor sore throat.  No sick contacts but is around small kids. Has her usual chronic back pain. No muscle aches. No rashes.   Review of Systems  HENT: Positive for postnasal drip.   Eyes: Positive for itching.  Gastrointestinal: Negative for diarrhea.  Skin: Negative for rash.  Neurological: Negative for dizziness.       Objective:   Physical Exam  Constitutional: She appears well-developed and well-nourished. No distress.  HENT:  Mouth/Throat: No oropharyngeal exudate.  Fluid behind each eardrum but no erythema normal canal  Eyes: Conjunctivae are normal. Right eye exhibits no discharge. Left eye exhibits no discharge. No scleral icterus.  Cardiovascular: Normal rate, regular rhythm and normal heart sounds.   No murmur heard. Pulmonary/Chest: Effort normal and breath sounds normal. No respiratory distress. She has no wheezes.  Abdominal: Soft. Bowel sounds are normal. She exhibits no distension. There is no tenderness.  Musculoskeletal: She exhibits no edema.  Lymphadenopathy:    She has no cervical adenopathy.  Skin: No rash noted.          Assessment & Plan:

## 2015-03-20 NOTE — Assessment & Plan Note (Signed)
Symptoms seem to be most related to allergies versus viral syndrome. I told her to try Benadryl at night and consider Zyrtec daily.

## 2015-03-21 ENCOUNTER — Emergency Department (HOSPITAL_COMMUNITY)
Admission: EM | Admit: 2015-03-21 | Discharge: 2015-03-21 | Disposition: A | Discharge status: Home / Self Care | Payer: 59 | Attending: Emergency Medicine | Admitting: Emergency Medicine

## 2015-03-21 ENCOUNTER — Emergency Department (HOSPITAL_COMMUNITY): Payer: 59

## 2015-03-21 ENCOUNTER — Encounter: Payer: Self-pay | Admitting: Internal Medicine

## 2015-03-21 ENCOUNTER — Encounter (HOSPITAL_COMMUNITY): Payer: Self-pay | Admitting: *Deleted

## 2015-03-21 DIAGNOSIS — R509 Fever, unspecified: Secondary | ICD-10-CM | POA: Diagnosis not present

## 2015-03-21 DIAGNOSIS — G8929 Other chronic pain: Secondary | ICD-10-CM | POA: Insufficient documentation

## 2015-03-21 DIAGNOSIS — Z21 Asymptomatic human immunodeficiency virus [HIV] infection status: Secondary | ICD-10-CM | POA: Insufficient documentation

## 2015-03-21 DIAGNOSIS — Z79899 Other long term (current) drug therapy: Secondary | ICD-10-CM | POA: Insufficient documentation

## 2015-03-21 DIAGNOSIS — Z8719 Personal history of other diseases of the digestive system: Secondary | ICD-10-CM | POA: Diagnosis not present

## 2015-03-21 DIAGNOSIS — Z862 Personal history of diseases of the blood and blood-forming organs and certain disorders involving the immune mechanism: Secondary | ICD-10-CM | POA: Diagnosis not present

## 2015-03-21 DIAGNOSIS — R52 Pain, unspecified: Secondary | ICD-10-CM

## 2015-03-21 DIAGNOSIS — Z3202 Encounter for pregnancy test, result negative: Secondary | ICD-10-CM | POA: Diagnosis not present

## 2015-03-21 DIAGNOSIS — Z8611 Personal history of tuberculosis: Secondary | ICD-10-CM | POA: Diagnosis not present

## 2015-03-21 DIAGNOSIS — Z87828 Personal history of other (healed) physical injury and trauma: Secondary | ICD-10-CM | POA: Diagnosis not present

## 2015-03-21 DIAGNOSIS — M549 Dorsalgia, unspecified: Secondary | ICD-10-CM | POA: Diagnosis present

## 2015-03-21 DIAGNOSIS — Z872 Personal history of diseases of the skin and subcutaneous tissue: Secondary | ICD-10-CM | POA: Insufficient documentation

## 2015-03-21 LAB — COMPREHENSIVE METABOLIC PANEL
ALT: 11 U/L (ref 0–35)
AST: 17 U/L (ref 0–37)
Albumin: 4.1 g/dL (ref 3.5–5.2)
Alkaline Phosphatase: 93 U/L (ref 39–117)
Anion gap: 6 (ref 5–15)
BUN: <5 mg/dL — ABNORMAL LOW (ref 6–23)
CO2: 26 mmol/L (ref 19–32)
Calcium: 9.2 mg/dL (ref 8.4–10.5)
Chloride: 105 mmol/L (ref 96–112)
Creatinine, Ser: 0.88 mg/dL (ref 0.50–1.10)
GFR calc Af Amer: >90 mL/min (ref >90)
GFR calc non Af Amer: 90 mL/min — ABNORMAL LOW (ref >90)
Glucose, Bld: 101 mg/dL — ABNORMAL HIGH (ref 70–99)
Potassium: 4.4 mmol/L (ref 3.5–5.1)
Sodium: 137 mmol/L (ref 135–145)
Total Bilirubin: 0.6 mg/dL (ref 0.3–1.2)
Total Protein: 8 g/dL (ref 6.0–8.3)

## 2015-03-21 LAB — URINALYSIS, ROUTINE W REFLEX MICROSCOPIC
Bilirubin Urine: NEGATIVE
Glucose, UA: NEGATIVE mg/dL
Hgb urine dipstick: NEGATIVE
Ketones, ur: NEGATIVE mg/dL
Nitrite: NEGATIVE
Protein, ur: NEGATIVE mg/dL
Specific Gravity, Urine: 1.023 (ref 1.005–1.030)
Urobilinogen, UA: 1 mg/dL (ref 0.0–1.0)
pH: 8.5 — ABNORMAL HIGH (ref 5.0–8.0)

## 2015-03-21 LAB — CBC WITH DIFFERENTIAL/PLATELET
Basophils Absolute: 0.1 10*3/uL (ref 0.0–0.1)
Basophils Relative: 1 % (ref 0–1)
Eosinophils Absolute: 0.2 10*3/uL (ref 0.0–0.7)
Eosinophils Relative: 4 % (ref 0–5)
HCT: 37.4 % (ref 36.0–46.0)
Hemoglobin: 12.4 g/dL (ref 12.0–15.0)
Lymphocytes Relative: 58 % — ABNORMAL HIGH (ref 12–46)
Lymphs Abs: 2.8 10*3/uL (ref 0.7–4.0)
MCH: 29.7 pg (ref 26.0–34.0)
MCHC: 33.2 g/dL (ref 30.0–36.0)
MCV: 89.5 fL (ref 78.0–100.0)
Monocytes Absolute: 0.6 10*3/uL (ref 0.1–1.0)
Monocytes Relative: 13 % — ABNORMAL HIGH (ref 3–12)
Neutro Abs: 1.1 10*3/uL — ABNORMAL LOW (ref 1.7–7.7)
Neutrophils Relative %: 24 % — ABNORMAL LOW (ref 43–77)
Platelets: 258 10*3/uL (ref 150–400)
RBC: 4.18 MIL/uL (ref 3.87–5.11)
RDW: 18.4 % — ABNORMAL HIGH (ref 11.5–15.5)
WBC: 4.7 10*3/uL (ref 4.0–10.5)

## 2015-03-21 LAB — URINE MICROSCOPIC-ADD ON

## 2015-03-21 LAB — POC URINE PREG, ED: Preg Test, Ur: NEGATIVE

## 2015-03-21 LAB — I-STAT CG4 LACTIC ACID, ED: Lactic Acid, Venous: 1.11 mmol/L (ref 0.5–2.0)

## 2015-03-21 LAB — LIPASE, BLOOD: Lipase: 40 U/L (ref 11–59)

## 2015-03-21 MED ORDER — SODIUM CHLORIDE 0.9 % IV BOLUS (SEPSIS)
1000.0000 mL | Freq: Once | INTRAVENOUS | Status: AC
  Administered 2015-03-21: 1000 mL via INTRAVENOUS

## 2015-03-21 MED ORDER — IBUPROFEN 800 MG PO TABS
800.0000 mg | ORAL_TABLET | Freq: Once | ORAL | Status: AC
  Administered 2015-03-21: 800 mg via ORAL
  Filled 2015-03-21: qty 1

## 2015-03-21 NOTE — ED Notes (Signed)
The pt is c/o  Low back pain for awhile and for the past week she has had a fever with a headache.  She saw her ob doctior on the 4th for the same .  Her fever was not addressed.  lmp depo.  She has also had vaginal bleeding for the past month and the doctor gave her a different type of med.  She had tylenol 2 hours ago

## 2015-03-21 NOTE — ED Provider Notes (Signed)
CSN: [id number]     Arrival date & time 03/21/15  1710 History   First MD Initiated Contact with Patient 03/21/15 1736     Chief Complaint  Patient presents with  . Back Pain     (Consider location/radiation/quality/duration/timing/severity/associated sxs/prior Treatment) Patient is a 24 y.o. female presenting with fever. The history is provided by the patient (the pt has had a fever for about 4 days.  she has body aches and worsing of chronic back pain.  pt was seen at ID yesterday).  Fever Temp source:  Oral Severity:  Mild Onset quality:  Gradual Timing:  Intermittent Progression:  Waxing and waning Chronicity:  New Worsened by:  Nothing tried Ineffective treatments:  Acetaminophen Associated symptoms: no chest pain, no congestion, no cough, no diarrhea, no headaches and no rash     Past Medical History  Diagnosis Date  . Tuberculosis   . HIV (human immunodeficiency virus infection) 02/2012  . Pelvic pain   . 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 Bell's palsy 08/26/2013  . Anemia of chronic disease 03/11/2012  . 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 Laceration of ankle, right 11/18/2012  . Reflux esophagitis 03/11/2012  . 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 Chronic headache   . Vertigo   . Chronic back pain   . Lumbar radiculopathy   . Chronic leg pain     bilateral knees, ankles  . Fatigue    Past Surgical History  Procedure Laterality Date  . Esophagogastroduodenoscopy  03/11/2012    Procedure: ESOPHAGOGASTRODUODENOSCOPY (EGD);  Surgeon: Lafayette Dragon, MD;  Location: [facility] ENDOSCOPY;  Service: Endoscopy;  Laterality: N/A;  . Appendectomy  ~ 2000  . Lung biopsy  02/2012  . Dilation and curettage of uterus  2008  . Esophagogastroduodenoscopy N/A 03/07/2014    Procedure: ESOPHAGOGASTRODUODENOSCOPY (EGD);  Surgeon: Gatha Mayer, MD;  Location: [facility] ENDOSCOPY;  Service: Endoscopy;  Laterality: N/A;   Family History  Problem  Relation Age of Onset  . Heart disease Father     Vague not clearly cardiac   History  Substance Use Topics  . Smoking status: Never Smoker   . Smokeless tobacco: Never Used  . Alcohol Use: No     Comment: socially   OB History    Gravida Para Term Preterm AB TAB SAB Ectopic Multiple Living   1 0 0 0 1 0 1 0 0 0      Review of Systems  Constitutional: Positive for fever. Negative for appetite change and fatigue.  HENT: Negative for congestion, ear discharge and sinus pressure.   Eyes: Negative for discharge.  Respiratory: Negative for cough.   Cardiovascular: Negative for chest pain.  Gastrointestinal: Negative for abdominal pain and diarrhea.  Genitourinary: Negative for frequency and hematuria.  Musculoskeletal: Positive for back pain.  Skin: Negative for rash.  Neurological: Negative for seizures and headaches.  Psychiatric/Behavioral: Negative for hallucinations.      Allergies  Hydrocodone  Home Medications   Prior to Admission medications   Medication Sig Start Date End Date Taking? Authorizing Provider  Darunavir Ethanolate (PREZISTA) 800 MG tablet Take 1 tablet (800 mg total) by mouth daily. 01/11/15  Yes Carlyle Basques, MD  dronabinol (MARINOL) 5 MG capsule Take 1 capsule (5 mg total) by mouth 2 (two) times daily before a meal. As needed 12/20/14  Yes Carlyle Basques, MD  emtricitabine-tenofovir (TRUVADA) 200-300 MG per tablet Take 1 tablet by mouth daily. 01/11/15  Yes Carlyle Basques, MD  MEGESTROL ACETATE PO Take 1 tablet by mouth daily.   Yes Historical Provider, MD  predniSONE (DELTASONE) 50 MG tablet Take 2 tablets (10 mg total) by mouth every morning and take 1 tablet (50 mg total) by mouth every evening. 01/30/15  Yes Historical Provider, MD  PREZISTA 800 MG tablet  02/28/15  Yes Historical Provider, MD  Probiotic Product (PROBIOTIC DAILY PO) Take 2 tablets by mouth 2 (two) times daily. gummy   Yes Historical Provider, MD  ritonavir (NORVIR) 100 MG TABS tablet  Take 1 tablet (100 mg total) by mouth daily. 01/11/15  Yes Carlyle Basques, MD  traMADol (ULTRAM) 50 MG tablet Take 50 mg by mouth every 6 (six) hours as needed for moderate pain.  03/14/15  Yes Historical Provider, MD  hydrocortisone (CORTEF) 5 MG tablet Take 2 tablets in the morning and 1 tablet in the afternoon, no later than 6 PM Patient not taking: Reported on 03/14/2015 11/28/14   Philemon Kingdom, MD  megestrol (MEGACE) 40 MG tablet Take 2 tablets (80 mg total) by mouth daily. Patient not taking: Reported on 03/20/2015 03/19/15   Patrecia Pour, MD   BP 117/78 mmHg  Pulse 81  Temp(Src) 100.1 F (37.8 C) (Oral)  Resp 18  SpO2 100% Physical Exam  Constitutional: She is oriented to person, place, and time. She appears well-developed.  HENT:  Head: Normocephalic.  Neck supple  Eyes: Conjunctivae and EOM are normal. No scleral icterus.  Neck: Neck supple. No thyromegaly present.  Cardiovascular: Normal rate and regular rhythm.  Exam reveals no gallop and no friction rub.   No murmur heard. Pulmonary/Chest: No stridor. She has no wheezes. She has no rales. She exhibits no tenderness.  Abdominal: She exhibits no distension. There is no tenderness. There is no rebound.  Musculoskeletal: Normal range of motion. She exhibits no edema.  Lymphadenopathy:    She has no cervical adenopathy.  Neurological: She is oriented to person, place, and time. She exhibits normal muscle tone. Coordination normal.  Skin: No rash noted. No erythema.  Psychiatric: She has a normal mood and affect. Her behavior is normal.    ED Course  Procedures (including critical care time) Labs Review Labs Reviewed  CBC WITH DIFFERENTIAL/PLATELET - Abnormal; Notable for the following:    RDW 18.4 (*)    Neutrophils Relative % 24 (*)    Neutro Abs 1.1 (*)    Lymphocytes Relative 58 (*)    Monocytes Relative 13 (*)    All other components within normal limits  COMPREHENSIVE METABOLIC PANEL - Abnormal; Notable for the  following:    Glucose, Bld 101 (*)    BUN <5 (*)    GFR calc non Af Amer 90 (*)    All other components within normal limits  URINALYSIS, ROUTINE W REFLEX MICROSCOPIC - Abnormal; Notable for the following:    APPearance CLOUDY (*)    pH 8.5 (*)    Leukocytes, UA TRACE (*)    All other components within normal limits  URINE MICROSCOPIC-ADD ON - Abnormal; Notable for the following:    Squamous Epithelial / LPF MANY (*)    Bacteria, UA MANY (*)    All other components within normal limits  CULTURE, BLOOD (ROUTINE X 2)  CULTURE, BLOOD (ROUTINE X 2)  LIPASE, BLOOD  T-HELPER CELLS (CD4) COUNT  POC URINE PREG, ED  I-STAT CG4 LACTIC ACID, ED    Imaging Review Dg Chest 2 View  03/21/2015   CLINICAL DATA:  Fever, chills, headaches  for 5 days, nonsmoker  EXAM: CHEST  2 VIEW  COMPARISON:  10/20/2014  FINDINGS: Cardiomediastinal silhouette is stable. No acute infiltrate or pulmonary edema. Stable calcified pleural nodule in left upper lobe measures 1.2 cm. Bony thorax is unremarkable.  IMPRESSION: No active disease.  No significant change.   Electronically Signed   By: Lahoma Crocker M.D.   On: 03/21/2015 19:06     EKG Interpretation None     I spoke with ID,  It was recommended that the pt go home,  Fluids.  Continue motrin and follow up in ID clinic tomorrow MDM   Final diagnoses:  Pain  Fever, unspecified fever cause    fever    Milton Ferguson, MD 03/21/15 [phone]

## 2015-03-21 NOTE — Discharge Instructions (Signed)
Follow up with your md tomorrow.  Call in am for appointment time

## 2015-03-22 LAB — T-HELPER CELLS (CD4) COUNT (NOT AT ARMC)
CD4 % Helper T Cell: 23 % — ABNORMAL LOW (ref 33–55)
CD4 T Cell Abs: 460 /uL (ref 400–2700)

## 2015-03-23 LAB — URINE CULTURE: Colony Count: 75000

## 2015-03-28 LAB — CULTURE, BLOOD (ROUTINE X 2)
Culture: NO GROWTH
Culture: NO GROWTH

## 2015-03-28 NOTE — Telephone Encounter (Signed)
error 

## 2015-04-04 ENCOUNTER — Ambulatory Visit: Payer: 59

## 2015-04-19 ENCOUNTER — Encounter (HOSPITAL_COMMUNITY): Payer: Self-pay

## 2015-04-19 ENCOUNTER — Emergency Department (HOSPITAL_COMMUNITY)
Admission: EM | Admit: 2015-04-19 | Discharge: 2015-04-19 | Disposition: A | Discharge status: Home / Self Care | Payer: 59 | Attending: Emergency Medicine | Admitting: Emergency Medicine

## 2015-04-19 DIAGNOSIS — R42 Dizziness and giddiness: Secondary | ICD-10-CM | POA: Insufficient documentation

## 2015-04-19 DIAGNOSIS — Z8719 Personal history of other diseases of the digestive system: Secondary | ICD-10-CM | POA: Insufficient documentation

## 2015-04-19 DIAGNOSIS — Z862 Personal history of diseases of the blood and blood-forming organs and certain disorders involving the immune mechanism: Secondary | ICD-10-CM | POA: Insufficient documentation

## 2015-04-19 DIAGNOSIS — Z87828 Personal history of other (healed) physical injury and trauma: Secondary | ICD-10-CM | POA: Insufficient documentation

## 2015-04-19 DIAGNOSIS — Z8619 Personal history of other infectious and parasitic diseases: Secondary | ICD-10-CM | POA: Insufficient documentation

## 2015-04-19 DIAGNOSIS — Z21 Asymptomatic human immunodeficiency virus [HIV] infection status: Secondary | ICD-10-CM | POA: Insufficient documentation

## 2015-04-19 DIAGNOSIS — Z872 Personal history of diseases of the skin and subcutaneous tissue: Secondary | ICD-10-CM | POA: Insufficient documentation

## 2015-04-19 DIAGNOSIS — G8929 Other chronic pain: Secondary | ICD-10-CM | POA: Insufficient documentation

## 2015-04-19 DIAGNOSIS — M545 Low back pain: Secondary | ICD-10-CM | POA: Insufficient documentation

## 2015-04-19 DIAGNOSIS — Z8611 Personal history of tuberculosis: Secondary | ICD-10-CM | POA: Insufficient documentation

## 2015-04-19 DIAGNOSIS — Z79899 Other long term (current) drug therapy: Secondary | ICD-10-CM | POA: Insufficient documentation

## 2015-04-19 LAB — CBC WITH DIFFERENTIAL/PLATELET
Basophils Absolute: 0 10*3/uL (ref 0.0–0.1)
Basophils Relative: 0 % (ref 0–1)
Eosinophils Absolute: 0.2 10*3/uL (ref 0.0–0.7)
Eosinophils Relative: 3 % (ref 0–5)
HCT: 37 % (ref 36.0–46.0)
Hemoglobin: 12.4 g/dL (ref 12.0–15.0)
Lymphocytes Relative: 39 % (ref 12–46)
Lymphs Abs: 2 10*3/uL (ref 0.7–4.0)
MCH: 30.3 pg (ref 26.0–34.0)
MCHC: 33.5 g/dL (ref 30.0–36.0)
MCV: 90.5 fL (ref 78.0–100.0)
Monocytes Absolute: 0.6 10*3/uL (ref 0.1–1.0)
Monocytes Relative: 13 % — ABNORMAL HIGH (ref 3–12)
Neutro Abs: 2.3 10*3/uL (ref 1.7–7.7)
Neutrophils Relative %: 45 % (ref 43–77)
Platelets: 272 10*3/uL (ref 150–400)
RBC: 4.09 MIL/uL (ref 3.87–5.11)
RDW: 16.6 % — ABNORMAL HIGH (ref 11.5–15.5)
WBC: 5.1 10*3/uL (ref 4.0–10.5)

## 2015-04-19 LAB — COMPREHENSIVE METABOLIC PANEL
ALT: 12 U/L — ABNORMAL LOW (ref 14–54)
AST: 29 U/L (ref 15–41)
Albumin: 4.3 g/dL (ref 3.5–5.0)
Alkaline Phosphatase: 89 U/L (ref 38–126)
Anion gap: 6 (ref 5–15)
BUN: <5 mg/dL — ABNORMAL LOW (ref 6–20)
CO2: 18 mmol/L — ABNORMAL LOW (ref 22–32)
Calcium: 8.8 mg/dL — ABNORMAL LOW (ref 8.9–10.3)
Chloride: 112 mmol/L — ABNORMAL HIGH (ref 101–111)
Creatinine, Ser: 0.66 mg/dL (ref 0.44–1.00)
GFR calc Af Amer: >60 mL/min (ref >60)
GFR calc non Af Amer: >60 mL/min (ref >60)
Glucose, Bld: 82 mg/dL (ref 70–99)
Potassium: 3.7 mmol/L (ref 3.5–5.1)
Sodium: 136 mmol/L (ref 135–145)
Total Bilirubin: 0.4 mg/dL (ref 0.3–1.2)
Total Protein: 7.6 g/dL (ref 6.5–8.1)

## 2015-04-19 MED ORDER — SODIUM CHLORIDE 0.9 % IV BOLUS (SEPSIS)
1000.0000 mL | Freq: Once | INTRAVENOUS | Status: AC
  Administered 2015-04-19: 1000 mL via INTRAVENOUS

## 2015-04-19 MED ORDER — ONDANSETRON HCL 4 MG/2ML IJ SOLN
4.0000 mg | Freq: Once | INTRAMUSCULAR | Status: AC
  Administered 2015-04-19: 4 mg via INTRAVENOUS
  Filled 2015-04-19: qty 2

## 2015-04-19 NOTE — ED Notes (Signed)
Bed: WA23 Expected date:  Expected time:  Means of arrival:  Comments: 

## 2015-04-19 NOTE — ED Notes (Signed)
Patient denies SI/HI. Patient states she was taking the medicine and beer together to get rid of the pain. Patient has chronic back pain. Patient states she has never taken pain medication with beer and she became scared.

## 2015-04-19 NOTE — Discharge Instructions (Signed)

## 2015-04-19 NOTE — ED Provider Notes (Signed)
CSN: [id number]     Arrival date & time 04/19/15  1845 History   First MD Initiated Contact with Patient 04/19/15 1906     Chief Complaint  Patient presents with  . Anxiety  . Dizziness     (Consider location/radiation/quality/duration/timing/severity/associated sxs/prior Treatment) HPI Comments: Patient presents today with a chief complaint of dizziness.  She reports that she was having back pain earlier today.  Therefore, she took two Tramadol.  She states that this did not help with the pain so an hour later she took two more Tramadol and drank a 40 ounce beer.  She states that 30 minutes after this she began feeling dizzy.  She describes the dizziness as feeling lightheaded.  She also reports associated nausea, but no vomiting.  She denies headache, fever, chills, syncope, numbness, tingling, or bowel/bladder incontinence.  She reports that her back pain is her typical back pain.  No acute injury or trauma.    The history is provided by the patient.    Past Medical History  Diagnosis Date  . Tuberculosis   . HIV (human immunodeficiency virus infection) 02/2012  . Pelvic pain   . 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 Bell's palsy 08/26/2013  . Anemia of chronic disease 03/11/2012  . 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 Laceration of ankle, right 11/18/2012  . Reflux esophagitis 03/11/2012  . 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 Chronic headache   . Vertigo   . Chronic back pain   . Lumbar radiculopathy   . Chronic leg pain     bilateral knees, ankles  . Fatigue    Past Surgical History  Procedure Laterality Date  . Esophagogastroduodenoscopy  03/11/2012    Procedure: ESOPHAGOGASTRODUODENOSCOPY (EGD);  Surgeon: Lafayette Dragon, MD;  Location: [facility] ENDOSCOPY;  Service: Endoscopy;  Laterality: N/A;  . Appendectomy  ~ 2000  . Lung biopsy  02/2012  . Dilation and curettage of uterus  2008  . Esophagogastroduodenoscopy N/A 03/07/2014   Procedure: ESOPHAGOGASTRODUODENOSCOPY (EGD);  Surgeon: Gatha Mayer, MD;  Location: [facility] ENDOSCOPY;  Service: Endoscopy;  Laterality: N/A;   Family History  Problem Relation Age of Onset  . Heart disease Father     Vague not clearly cardiac   History  Substance Use Topics  . Smoking status: Never Smoker   . Smokeless tobacco: Never Used  . Alcohol Use: No     Comment: socially   OB History    Gravida Para Term Preterm AB TAB SAB Ectopic Multiple Living   1 0 0 0 1 0 1 0 0 0      Review of Systems  All other systems reviewed and are negative.     Allergies  Hydrocodone  Home Medications   Prior to Admission medications   Medication Sig Start Date End Date Taking? Authorizing Provider  Darunavir Ethanolate (PREZISTA) 800 MG tablet Take 1 tablet (800 mg total) by mouth daily. 01/11/15   Carlyle Basques, MD  dronabinol (MARINOL) 5 MG capsule Take 1 capsule (5 mg total) by mouth 2 (two) times daily before a meal. As needed 12/20/14   Carlyle Basques, MD  emtricitabine-tenofovir (TRUVADA) 200-300 MG per tablet Take 1 tablet by mouth daily. 01/11/15   Carlyle Basques, MD  hydrocortisone (CORTEF) 5 MG tablet Take 2 tablets in the morning and 1 tablet in the afternoon, no later than 6 PM Patient not taking: Reported on 03/14/2015 11/28/14   Philemon Kingdom, MD  megestrol (MEGACE) 40 MG tablet  Take 2 tablets (80 mg total) by mouth daily. Patient not taking: Reported on 03/20/2015 03/19/15   Patrecia Pour, MD  MEGESTROL ACETATE PO Take 1 tablet by mouth daily.    Historical Provider, MD  predniSONE (DELTASONE) 50 MG tablet Take 2 tablets (10 mg total) by mouth every morning and take 1 tablet (50 mg total) by mouth every evening. 01/30/15   Historical Provider, MD  PREZISTA 800 MG tablet  02/28/15   Historical Provider, MD  Probiotic Product (PROBIOTIC DAILY PO) Take 2 tablets by mouth 2 (two) times daily. gummy    Historical Provider, MD  ritonavir (NORVIR) 100 MG TABS tablet Take 1 tablet (100 mg  total) by mouth daily. 01/11/15   Carlyle Basques, MD  traMADol (ULTRAM) 50 MG tablet Take 50 mg by mouth every 6 (six) hours as needed for moderate pain.  03/14/15   Historical Provider, MD   BP 135/91 mmHg  Pulse 97  Temp(Src) 98.5 F (36.9 C) (Oral)  Resp 16  SpO2 95% Physical Exam  Constitutional: She appears well-developed and well-nourished.  HENT:  Head: Normocephalic and atraumatic.  Mouth/Throat: Oropharynx is clear and moist.  Eyes: EOM are normal. Pupils are equal, round, and reactive to light.  Neck: Normal range of motion. Neck supple.  Cardiovascular: Normal rate, regular rhythm and normal heart sounds.   Pulmonary/Chest: Effort normal and breath sounds normal.  Abdominal: Soft. Bowel sounds are normal.  Musculoskeletal:       Lumbar back: She exhibits tenderness and bony tenderness. She exhibits no swelling, no edema and no deformity.  Neurological: She is alert. She has normal strength. No cranial nerve deficit or sensory deficit. She displays a negative Romberg sign.  Reflex Scores:      Patellar reflexes are 2+ on the right side and 2+ on the left side. Muscle strength lower extremities 5/5 bilaterally Distal sensation of both feet intact  Skin: Skin is warm and dry.  Psychiatric: She has a normal mood and affect.  Nursing note and vitals reviewed.   ED Course  Procedures (including critical care time) Labs Review Labs Reviewed - No data to display  Imaging Review No results found.   EKG Interpretation None     10:12 PM Reassessed patient.  She reports that she is feeling much better.  Dizziness has improved.  VSS.  Tolerating PO liquids.  MDM   Final diagnoses:  None   Patient presents today with complaint of dizziness.  She reports that she began feeling dizzy after drinking a 40 ounce beer and taking two Tramadol.  Dizziness resolved in the ED after given IVF.  Labs unremarkable.  Normal neurological exam.  Feel that the patient is stable for  discharge.  Patient discussed with Dr. Jeneen Rinks who is in agreement with the plan.  Return precautions given.      Hyman Bible, PA-C 04/21/15 2027  Tanna Furry, MD 04/30/15 [phone]

## 2015-04-19 NOTE — ED Notes (Signed)
Per EMS- Patient states she has chronic back pain and took 2-3 Tramadol with a beer and is now feeling dizzy and anxious.

## 2015-04-20 ENCOUNTER — Ambulatory Visit (INDEPENDENT_AMBULATORY_CARE_PROVIDER_SITE_OTHER): Payer: Self-pay | Admitting: Family Medicine

## 2015-04-20 VITALS — BP 153/100 | HR 69 | Temp 98.2°F | Ht 64.0 in | Wt 153.4 lb

## 2015-04-20 DIAGNOSIS — E271 Primary adrenocortical insufficiency: Secondary | ICD-10-CM

## 2015-04-20 DIAGNOSIS — N921 Excessive and frequent menstruation with irregular cycle: Secondary | ICD-10-CM

## 2015-04-20 DIAGNOSIS — E663 Overweight: Secondary | ICD-10-CM

## 2015-04-20 LAB — POCT GLYCOSYLATED HEMOGLOBIN (HGB A1C): Hemoglobin A1C: 5.1

## 2015-04-20 NOTE — Progress Notes (Signed)
Subjective: Kelsey Rodriguez is a 24 y.o. female presenting for what was going to be follow up of menorrhagia but is actually ED follow up. Menorrhagia was treated and has not returned.   She reports low back pain chronically though it got worse yesterday. She has also been constipated. For this pain she took 2 tramadol, then 2 more tramadol about 1 hour later, then drank a beer to help her get to sleep. Unfortunately she was unable to sleep because it made her sick. She was light-headed, nauseous and weak with palpitations so she called EMS and at the ED she was given IV medications and an injection and told to follow up with her PCP. She denies syncope, LOC or seizure-like activity during this time.   She is still lightheaded and uneasy when she walks, though this is better than yesterday. In fact, all symptoms are better today. She is having on and off dyspnea and dizziness. Dyspnea feels like she is breathing deeply but not short of air or distressed and not exertional. She denies chest pain now.   ROS: No fevers, chills, cough, rashes (though she has had some itchiness). Pt denies any current bowel/bladder problems, fever, chills, unintentional weight loss, night time awakenings secondary to pain, weakness in one or both legs. Also denies any irregular menses or heavy bleeding.   - Nonsmoker - No history of seizures. Thinks she has been treated for vertigo in the past.   Objective: BP 157/100 mmHg  Pulse 69  Temp(Src) 98.2 F (36.8 C) (Oral)  Ht 5\' 4"  (1.626 m)  Wt 153 lb 6.4 oz (69.582 kg)  BMI 26.32 kg/m2  SpO2 100% Gen: Well-appearing 24 y.o. female in no distress HEENT: MMM. Dix-Hallpike negative.  Pulm: Non-labored; CTAB, no wheezes  CV: Regular rate, no murmur; distal pulses intact/symmetric; no LE edema Neuro: A&Ox3, CN II-XII without deficits. Gait normal. Speech normal.   Assessment/Plan: Kelsey Rodriguez is a 24 y.o. female here for follow up.  See problem list for plan.

## 2015-04-20 NOTE — Assessment & Plan Note (Signed)
Resolved. CBC normal yesterday.

## 2015-04-20 NOTE — Assessment & Plan Note (Addendum)
Checking random cortisol in setting of fatigue. Still taking hydrocortisone. No hypotension.

## 2015-04-20 NOTE — Assessment & Plan Note (Signed)
Discussed activity and diet. Checking A1c today.

## 2015-04-20 NOTE — Patient Instructions (Signed)
It was good to see you again. I think your recent symptoms were related to taking so much tramadol. You should never take more than 1 tablets in any 4 hour period and should not drink any alcohol while taking that medication. I recommend trying benadryl at night to help with sleep if you are unable to get to sleep. We can discuss this more at another office visit if you'd like.   We will send you a letter with your test results or call you if they are abnormal.

## 2015-04-21 LAB — IRON AND TIBC
%SAT: 7 % — ABNORMAL LOW (ref 20–55)
Iron: 35 ug/dL — ABNORMAL LOW (ref 42–145)
TIBC: 482 ug/dL — ABNORMAL HIGH (ref 250–470)
UIBC: 447 ug/dL — ABNORMAL HIGH (ref 125–400)

## 2015-04-21 LAB — CORTISOL: Cortisol, Plasma: 4.1 ug/dL

## 2015-04-23 ENCOUNTER — Telehealth: Payer: Self-pay | Admitting: Family Medicine

## 2015-04-23 DIAGNOSIS — D509 Iron deficiency anemia, unspecified: Secondary | ICD-10-CM

## 2015-04-23 MED ORDER — POLYETHYLENE GLYCOL 3350 17 GM/SCOOP PO POWD: 17.0000 g | Freq: Every day | ORAL | Status: DC

## 2015-04-23 MED ORDER — FERROUS SULFATE 325 (65 FE) MG PO TABS: 325.0000 mg | ORAL_TABLET | Freq: Two times a day (BID) | ORAL | Status: DC

## 2015-04-23 NOTE — Telephone Encounter (Signed)
Called x2 and LVM for pt to call back regarding recent iron studies. If she calls back please read the following:   Iron levels are very low and this makes creating red blood cells difficult, which is part of why you are anemic. This anemia is likely causing some of your symptoms. To treat this I have sent in iron pills for you to take twice per day. Because these can cause constipation you should also take miralax at least once daily to prevent constipation. This, too, has been sent in to your pharmacy.

## 2015-04-23 NOTE — Assessment & Plan Note (Signed)
T sat 7% in setting of menorrhagia: Will start ferrous sulfate 325mg  po BID with daily miralax. May need feraheme for initial restoration. Certainly there is a component of anemia of chronic disease.  IRON 35* 04/20/2015 1447  TIBC 482* 04/20/2015 1447  FERRITIN 3* 12/16/2012 1433  IRONPCTSAT 7* 04/20/2015 1447

## 2015-04-26 ENCOUNTER — Encounter: Payer: Self-pay | Admitting: Internal Medicine

## 2015-04-27 ENCOUNTER — Encounter: Payer: Self-pay | Admitting: Internal Medicine

## 2015-04-30 ENCOUNTER — Ambulatory Visit (INDEPENDENT_AMBULATORY_CARE_PROVIDER_SITE_OTHER): Payer: Self-pay | Admitting: Internal Medicine

## 2015-04-30 ENCOUNTER — Encounter: Payer: Self-pay | Admitting: Internal Medicine

## 2015-04-30 VITALS — BP 130/84 | HR 80 | Temp 98.2°F | Wt 157.0 lb

## 2015-04-30 DIAGNOSIS — B20 Human immunodeficiency virus [HIV] disease: Secondary | ICD-10-CM

## 2015-04-30 LAB — BASIC METABOLIC PANEL WITH GFR
BUN: 5 mg/dL — ABNORMAL LOW (ref 6–23)
CO2: 24 mEq/L (ref 19–32)
Calcium: 9.6 mg/dL (ref 8.4–10.5)
Chloride: 101 mEq/L (ref 96–112)
Creat: 0.67 mg/dL (ref 0.50–1.10)
GFR, Est African American: >89 mL/min
GFR, Est Non African American: >89 mL/min
Glucose, Bld: 71 mg/dL (ref 70–99)
Potassium: 4.2 mEq/L (ref 3.5–5.3)
Sodium: 137 mEq/L (ref 135–145)

## 2015-04-30 NOTE — Progress Notes (Signed)
Patient ID: Kelsey Rodriguez, female   DOB: 11-Jan-1991, 24 y.o.   MRN: [id number]       Patient ID: Kelsey Rodriguez, female   DOB: January 20, 1991, 24 y.o.   MRN: [id number]  HPI Kelsey Rodriguez is a 24yo F with HIV disease - hx of pulm & mediastinal mTB, adrenal insufficiency. She is on Truvada/DRVr. Also has iron deficiency/ACD, recently placed on iron supplementation. CD 4 count 460/VL<20 (feb 2016). She recently went to the ED for dizziness that occurred throughout the day, including yesterday. She states it is associated with nausea due to dizziness. She no longer takes hydrocortisone  occ fever - had sick visit in clinic on April. Also pruritis  Outpatient Encounter Prescriptions as of 04/30/2015  Medication Sig  . Darunavir Ethanolate (PREZISTA) 800 MG tablet Take 1 tablet (800 mg total) by mouth daily.  [name] dronabinol (MARINOL) 5 MG capsule Take 1 capsule (5 mg total) by mouth 2 (two) times daily before a meal. As needed (Patient taking differently: Take 5 mg by mouth 2 (two) times daily as needed (appetite). )  . emtricitabine-tenofovir (TRUVADA) 200-300 MG per tablet Take 1 tablet by mouth daily.  . ferrous sulfate 325 (65 FE) MG tablet Take 1 tablet (325 mg total) by mouth 2 (two) times daily with a meal.  . hydrocortisone (CORTEF) 5 MG tablet Take 2 tablets in the morning and 1 tablet in the afternoon, no later than 6 PM  . polyethylene glycol powder (GLYCOLAX/MIRALAX) powder Take 17 g by mouth daily. Take daily or as needed to produce 1 normal bowel movement per day.  . predniSONE (DELTASONE) 50 MG tablet Take 50-100 mg by mouth 2 (two) times daily. Take 2 tablets (10 mg total) by mouth every morning and take 1 tablet (50 mg total) by mouth every evening.  . Probiotic Product (PROBIOTIC DAILY PO) Take 2 tablets by mouth 2 (two) times daily. gummy  . ritonavir (NORVIR) 100 MG TABS tablet Take 1 tablet (100 mg total) by mouth daily.  . traMADol (ULTRAM) 50 MG tablet Take 50 mg by mouth every 6 (six) hours as needed  for moderate pain.    No facility-administered encounter medications on file as of 04/30/2015.     Patient Active Problem List   Diagnosis Date Noted  . Overweight (BMI 25.0-29.9) 04/20/2015  . Seasonal allergies 03/20/2015  . Menorrhagia 03/19/2015  . Back pain of lumbar region with sciatica 02/12/2015  . Dizziness and giddiness 01/23/2015  . Primary adrenal insufficiency 01/03/2015  . Fatigue 09/25/2014  . Chest pain 07/07/2013  . Laceration of ankle, right 11/18/2012  . 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 Vaginal discharge 05/30/2012  . Arthralgia 05/20/2012  . HIV (human immunodeficiency virus infection) 03/16/2012  . Dysphagia 03/11/2012  . 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 Iron deficiency anemia 03/11/2012  . Reflux esophagitis 03/11/2012     There are no preventive care reminders to display for this patient.   Review of Systems 10 point ros reviewed, positive pertinents listed in hpi Physical Exam   BP 130/84 mmHg  Pulse 80  Temp(Src) 98.2 F (36.8 C) (Oral)  Wt 157 lb (71.215 kg) Physical Exam  Constitutional:  oriented to person, place, and time. appears well-developed and well-nourished. No distress.  HENT: [place]/AT, PERRLA, no scleral icterus Mouth/Throat: Oropharynx is clear and moist. No oropharyngeal exudate.  Cardiovascular: Normal rate, regular rhythm and normal heart sounds. Exam reveals no gallop and no friction rub.  No murmur heard.  Pulmonary/Chest: Effort normal and breath  sounds normal. No respiratory distress.  has no wheezes.  Neck = supple, no nuchal rigidity Abdominal: Soft. Bowel sounds are normal.  exhibits no distension. There is no tenderness.  Lymphadenopathy: no cervical adenopathy. No axillary adenopathy Neurological: alert and oriented to person, place, and time.  Skin: Skin is warm and dry. No rash noted. No erythema.  Psychiatric: a normal mood and affect.  behavior is normal.   Lab Results  Component Value Date   CD4TCELL  23* 03/21/2015   Lab Results  Component Value Date   CD4TABS 460 03/21/2015   CD4TABS 650 01/23/2015   CD4TABS 610 10/24/2014   Lab Results  Component Value Date   HIV1RNAQUANT <20 01/23/2015   Lab Results  Component Value Date   HEPBSAB NEGATIVE 03/13/2012   No results found for: RPR  CBC Lab Results  Component Value Date   WBC 5.1 04/19/2015   RBC 4.09 04/19/2015   HGB 12.4 04/19/2015   HCT 37.0 04/19/2015   PLT 272 04/19/2015   MCV 90.5 04/19/2015   MCH 30.3 04/19/2015   MCHC 33.5 04/19/2015   RDW 16.6* 04/19/2015   LYMPHSABS 2.0 04/19/2015   MONOABS 0.6 04/19/2015   EOSABS 0.2 04/19/2015   BASOSABS 0.0 04/19/2015   BMET Lab Results  Component Value Date   NA 136 04/19/2015   K 3.7 04/19/2015   CL 112* 04/19/2015   CO2 18* 04/19/2015   GLUCOSE 82 04/19/2015   BUN <5* 04/19/2015   CREATININE 0.66 04/19/2015   CALCIUM 8.8* 04/19/2015   GFRNONAA >60 04/19/2015   GFRAA >60 04/19/2015     Assessment and Plan   hiv disease = well controlled in April's labs. We will check  bmp for cr function. She also had low bicarb for unclear reasons on may 5th labs. Repeating to see if persistent change  Primary adrenal insufficiency = she appears improved, will touch base with dr. Cruzita Lederer if she needs to continue with meds. She is no longer hypotensive (but complains of vertiginous sx), no longer having doe, or lower extremity edema  Vertigo = will give handout on eply maneuver, and give zofran prn

## 2015-05-01 ENCOUNTER — Other Ambulatory Visit: Payer: Self-pay | Admitting: *Deleted

## 2015-05-01 ENCOUNTER — Other Ambulatory Visit: Payer: 59

## 2015-05-01 DIAGNOSIS — B20 Human immunodeficiency virus [HIV] disease: Secondary | ICD-10-CM

## 2015-05-01 MED ORDER — RITONAVIR 100 MG PO TABS: 100.0000 mg | ORAL_TABLET | Freq: Every day | ORAL | Status: DC

## 2015-05-01 MED ORDER — EMTRICITABINE-TENOFOVIR DF 200-300 MG PO TABS: 1.0000 | ORAL_TABLET | Freq: Every day | ORAL | Status: DC

## 2015-05-01 MED ORDER — DARUNAVIR ETHANOLATE 800 MG PO TABS: 800.0000 mg | ORAL_TABLET | Freq: Every day | ORAL | Status: DC

## 2015-05-01 NOTE — Progress Notes (Signed)
ADAP application 

## 2015-05-07 ENCOUNTER — Encounter: Payer: Self-pay | Admitting: Internal Medicine

## 2015-05-09 ENCOUNTER — Telehealth: Payer: Self-pay | Admitting: *Deleted

## 2015-05-09 NOTE — Telephone Encounter (Signed)
Tried to answer patient via mychart and it showed inactive. Patient sent an email stating she was having ongoing leg pain and requesting an appt. Advised patient her appt with Dr. Baxter Flattery on 06/04/15 is the first available. Told her about [facility] that sees uninsured patients. Advised her to walk in and request to establish care there; she agreed with this plan.

## 2015-05-15 ENCOUNTER — Ambulatory Visit: Payer: 59 | Admitting: Internal Medicine

## 2015-05-18 ENCOUNTER — Ambulatory Visit (INDEPENDENT_AMBULATORY_CARE_PROVIDER_SITE_OTHER): Payer: Self-pay | Admitting: Family Medicine

## 2015-05-18 ENCOUNTER — Encounter: Payer: Self-pay | Admitting: Family Medicine

## 2015-05-18 VITALS — BP 137/78 | HR 92 | Temp 97.8°F | Wt 154.0 lb

## 2015-05-18 DIAGNOSIS — D509 Iron deficiency anemia, unspecified: Secondary | ICD-10-CM

## 2015-05-18 DIAGNOSIS — R112 Nausea with vomiting, unspecified: Secondary | ICD-10-CM

## 2015-05-18 DIAGNOSIS — R110 Nausea: Secondary | ICD-10-CM | POA: Insufficient documentation

## 2015-05-18 DIAGNOSIS — E271 Primary adrenocortical insufficiency: Secondary | ICD-10-CM

## 2015-05-18 DIAGNOSIS — R5383 Other fatigue: Secondary | ICD-10-CM

## 2015-05-18 DIAGNOSIS — R42 Dizziness and giddiness: Secondary | ICD-10-CM

## 2015-05-18 LAB — CBC WITH DIFFERENTIAL/PLATELET
Basophils Absolute: 0.1 10*3/uL (ref 0.0–0.1)
Basophils Relative: 1 % (ref 0–1)
Eosinophils Absolute: 0.2 10*3/uL (ref 0.0–0.7)
Eosinophils Relative: 3 % (ref 0–5)
HCT: 37.5 % (ref 36.0–46.0)
Hemoglobin: 12.3 g/dL (ref 12.0–15.0)
Lymphocytes Relative: 25 % (ref 12–46)
Lymphs Abs: 1.4 10*3/uL (ref 0.7–4.0)
MCH: 29.6 pg (ref 26.0–34.0)
MCHC: 32.8 g/dL (ref 30.0–36.0)
MCV: 90.1 fL (ref 78.0–100.0)
MPV: 10.8 fL (ref 8.6–12.4)
Monocytes Absolute: 0.7 10*3/uL (ref 0.1–1.0)
Monocytes Relative: 12 % (ref 3–12)
Neutro Abs: 3.4 10*3/uL (ref 1.7–7.7)
Neutrophils Relative %: 59 % (ref 43–77)
Platelets: 286 10*3/uL (ref 150–400)
RBC: 4.16 MIL/uL (ref 3.87–5.11)
RDW: 16 % — ABNORMAL HIGH (ref 11.5–15.5)
WBC: 5.7 10*3/uL (ref 4.0–10.5)

## 2015-05-18 LAB — BASIC METABOLIC PANEL
BUN: 7 mg/dL (ref 6–23)
CO2: 24 mEq/L (ref 19–32)
Calcium: 9.5 mg/dL (ref 8.4–10.5)
Chloride: 106 mEq/L (ref 96–112)
Creat: 0.63 mg/dL (ref 0.50–1.10)
Glucose, Bld: 79 mg/dL (ref 70–99)
Potassium: 4.1 mEq/L (ref 3.5–5.3)
Sodium: 139 mEq/L (ref 135–145)

## 2015-05-18 MED ORDER — HYDROCORTISONE 5 MG PO TABS: ORAL_TABLET | ORAL | Status: DC

## 2015-05-18 MED ORDER — ONDANSETRON HCL 4 MG PO TABS: 4.0000 mg | ORAL_TABLET | Freq: Three times a day (TID) | ORAL | Status: DC | PRN

## 2015-05-18 NOTE — Patient Instructions (Signed)
I have refilled your hydrocortisone tablets, you need to pick these up today and start them immediately. You will need to follow-up with your endocrine doctor within 1 week and your primary doctor within 1 week I have given you a medication called Zofran to help with the nausea and hopefully allow you to take in food and remain hydrated; if you're unable to remain hydrated holding down anything over the next 24-48 hours you must go to the emergency room to be evaluated. We have repeated your  Random cortisol level today.

## 2015-05-19 ENCOUNTER — Encounter: Payer: Self-pay | Admitting: Family Medicine

## 2015-05-19 LAB — CORTISOL: Cortisol, Plasma: 6.8 ug/dL

## 2015-05-19 NOTE — Progress Notes (Signed)
Subjective:    Patient ID: Kelsey Rodriguez, female    DOB: 1991/02/13, 24 y.o.   MRN: [id number]  HPI  Emesis: Patient presents to family medicine clinic for same day appointment with complaints of a 2 day history of vomiting and dizziness. The full history is difficult to obtain from patient secondary to her being a very poor historian. Patient states that 2 days ago she vomited her meal, and greenish fluid. She reports all over body weakness and dizziness. She states that she had vertigo a few months ago and received treatment, but this feels different. She states she does not feel like the room is spinning. She denies any fevers, diarrhea or sick contacts. She admits to occasional chills and mild diffuse abdominal pain with vomit. Patient does have a significant medical history of HIV, adrenal insufficiency and iron deficiency anemia. Per chart dizziness, fatigue has been chronic in nature. Patient had been seen by an endocrinologist and started on Cortef for adrenal insufficiency. Patient states that she took the medication daily until she ran out of refills, at that time she didn't think she was supposed to continue the medication. She did not contact her endocrinologist. Patient had a anemia panel 4 weeks ago that showed low iron, she was started on iron supplementation at that time but has only been taking it once a day.  Never Smoker Past Medical History  Diagnosis Date  . Tuberculosis   . HIV (human immunodeficiency virus infection) 02/2012  . Pelvic pain   . 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 Bell's palsy 08/26/2013  . Anemia of chronic disease 03/11/2012  . 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 Laceration of ankle, right 11/18/2012  . Reflux esophagitis 03/11/2012  . 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 Chronic headache   . Vertigo   . Chronic back pain   . Lumbar radiculopathy   . Chronic leg pain     bilateral knees, ankles  . Fatigue     Allergies  Allergen Reactions  . Hydrocodone     Makes me sick   Past Surgical History  Procedure Laterality Date  . Esophagogastroduodenoscopy  03/11/2012    Procedure: ESOPHAGOGASTRODUODENOSCOPY (EGD);  Surgeon: Lafayette Dragon, MD;  Location: [facility] ENDOSCOPY;  Service: Endoscopy;  Laterality: N/A;  . Appendectomy  ~ 2000  . Lung biopsy  02/2012  . Dilation and curettage of uterus  2008  . Esophagogastroduodenoscopy N/A 03/07/2014    Procedure: ESOPHAGOGASTRODUODENOSCOPY (EGD);  Surgeon: Gatha Mayer, MD;  Location: [facility] ENDOSCOPY;  Service: Endoscopy;  Laterality: N/A;    Review of Systems Per HPI    Objective:   Physical Exam BP 137/78 mmHg  Pulse 92  Temp(Src) 97.8 F (36.6 C) (Oral)  Wt 154 lb (69.854 kg) Gen: NAD. Appears fatigued. Nontoxic in appearance. AAF. Well developed, well nourished.  HEENT: AT. [place]. Bilateral TM visualized and normal in appearance. Bilateral eyes without injections or icterus. MMM. Bilateral nares without erythema or swelling. Throat without erythema or exudates.  CV: RRR  Chest: CTAB, no wheeze or crackles Abd: Soft. flat. NTND. BS present. No Masses palpated.  Ext: No erythema. No edema.  Skin: No rashes, purpura or petechiae.  Neuro: Normal gait. PERLA. EOMi. Alert. CN 2-12 intact. No focal deficits. 5/5 UE/LE MS.   Psych:Normal affect, dress and speech.      Assessment & Plan:  Kelsey Rodriguez is  24 y.o. AAF with complaints of Vomit and dizziness, which according to  the chart appears chronic in nature. Significant PMH of HIV, iron deficiency anemia and adrenal Insufficiency. She has been without cortef for a few months. She states she was unaware she was suppose to continue after her refills ran out, despite her endocrinologist AVS stating to make certain never to run out of cortef. VSS. Did appear fatigued.  - Encouraged patient to continue her iron supplementation 2-3 times a day (had been taking it once). Advised she may need to increase her  miralax use if she finds herself more constipated from the iron use. - Zofran for nausea prescribed, pt encouraged to stay well hydrated. If unable to tolerate PO after sofran use, she is to go to ED for evaluation and possible IVF. Pt voiced understanding.  - Labs: BMP, CBC and random cortisol collected today. Patient will be called with abnormal results, otherwise normal result will be mailed to her home.  - Refilled her cortef at prior dose and encouraged her to make an appointment this week with her endocrinologist for follow up and in order to get future refills of medication.  - F/U PCP 1-2 weeks, or sooner if no improvement

## 2015-05-19 NOTE — Assessment & Plan Note (Signed)
Chantella Creech is  24 y.o. AAF with complaints of Vomit and dizziness, which according to the chart appears chronic in nature. Significant PMH of HIV, iron deficiency anemia and adrenal Insufficiency. She has been without cortef for a few months. She states she was unaware she was suppose to continue after her refills ran out, despite her endocrinologist AVS stating to make certain never to run out of cortef. VSS. Did appear fatigued.  - Encouraged patient to continue her iron supplementation 2-3 times a day (had been taking it once). Advised she may need to increase her miralax use if she finds herself more constipated from the iron use. - Zofran for nausea prescribed, pt encouraged to stay well hydrated. If unable to tolerate PO after sofran use, she is to go to ED for evaluation and possible IVF. Pt voiced understanding.  - Labs: BMP, CBC and random cortisol collected today. Patient will be called with abnormal results, otherwise normal result will be mailed to her home.  - Refilled her cortef at prior dose and encouraged her to make an appointment this week with her endocrinologist for follow up and in order to get future refills of medication.  - F/U PCP 1-2 weeks, or sooner if no improvement

## 2015-05-19 NOTE — Assessment & Plan Note (Signed)
Kelsey Rodriguez is  24 y.o. AAF with complaints of Vomit and dizziness, which according to the chart appears chronic in nature. Significant PMH of HIV, iron deficiency anemia and adrenal Insufficiency. She has been without cortef for a few months. She states she was unaware she was suppose to continue after her refills ran out, despite her endocrinologist AVS stating to make certain never to run out of cortef. VSS. Did appear fatigued.  - Encouraged patient to continue her iron supplementation 2-3 times a day (had been taking it once). Advised she may need to increase her miralax use if she finds herself more constipated from the iron use. - Zofran for nausea prescribed, pt encouraged to stay well hydrated. If unable to tolerate PO after sofran use, she is to go to ED for evaluation and possible IVF. Pt voiced understanding.  - Labs: BMP, CBC and random cortisol collected today. Patient will be called with abnormal results, otherwise normal result will be mailed to her home.  - Refilled her cortef at prior dose and encouraged her to make an appointment this week with her endocrinologist for follow up and in order to get future refills of medication.  - F/U PCP 1-2 weeks, or sooner if no improvement

## 2015-05-19 NOTE — Assessment & Plan Note (Signed)
[name] is  24 y.o. AAF with complaints of Vomit and dizziness, which according to the chart appears chronic in nature. Significant PMH of HIV, iron deficiency anemia and adrenal Insufficiency. She has been without cortef for a few months. She states she was unaware she was suppose to continue after her refills ran out, despite her endocrinologist AVS stating to make certain never to run out of cortef. VSS. Did appear fatigued.  - Encouraged patient to continue her iron supplementation 2-3 times a day (had been taking it once). Advised she may need to increase her miralax use if she finds herself more constipated from the iron use. - Zofran for nausea prescribed, pt encouraged to stay well hydrated. If unable to tolerate PO after sofran use, she is to go to ED for evaluation and possible IVF. Pt voiced understanding.  - Labs: BMP, CBC and random cortisol collected today. Patient will be called with abnormal results, otherwise normal result will be mailed to her home.  - Refilled her cortef at prior dose and encouraged her to make an appointment this week with her endocrinologist for follow up and in order to get future refills of medication.  - F/U PCP 1-2 weeks, or sooner if no improvement

## 2015-05-22 ENCOUNTER — Encounter: Payer: Self-pay | Admitting: Internal Medicine

## 2015-06-01 ENCOUNTER — Emergency Department (HOSPITAL_COMMUNITY)
Admission: EM | Admit: 2015-06-01 | Discharge: 2015-06-01 | Disposition: A | Discharge status: Home / Self Care | Payer: Self-pay | Attending: Emergency Medicine | Admitting: Emergency Medicine

## 2015-06-01 ENCOUNTER — Emergency Department (HOSPITAL_COMMUNITY): Payer: Self-pay

## 2015-06-01 ENCOUNTER — Encounter (HOSPITAL_COMMUNITY): Payer: Self-pay | Admitting: Vascular Surgery

## 2015-06-01 DIAGNOSIS — M1990 Unspecified osteoarthritis, unspecified site: Secondary | ICD-10-CM | POA: Insufficient documentation

## 2015-06-01 DIAGNOSIS — D649 Anemia, unspecified: Secondary | ICD-10-CM | POA: Insufficient documentation

## 2015-06-01 DIAGNOSIS — G8929 Other chronic pain: Secondary | ICD-10-CM | POA: Insufficient documentation

## 2015-06-01 DIAGNOSIS — K5900 Constipation, unspecified: Secondary | ICD-10-CM | POA: Insufficient documentation

## 2015-06-01 DIAGNOSIS — Z8611 Personal history of tuberculosis: Secondary | ICD-10-CM | POA: Insufficient documentation

## 2015-06-01 DIAGNOSIS — Z8619 Personal history of other infectious and parasitic diseases: Secondary | ICD-10-CM | POA: Insufficient documentation

## 2015-06-01 DIAGNOSIS — Z87828 Personal history of other (healed) physical injury and trauma: Secondary | ICD-10-CM | POA: Insufficient documentation

## 2015-06-01 DIAGNOSIS — Z21 Asymptomatic human immunodeficiency virus [HIV] infection status: Secondary | ICD-10-CM | POA: Insufficient documentation

## 2015-06-01 DIAGNOSIS — Z79899 Other long term (current) drug therapy: Secondary | ICD-10-CM | POA: Insufficient documentation

## 2015-06-01 DIAGNOSIS — Z8669 Personal history of other diseases of the nervous system and sense organs: Secondary | ICD-10-CM | POA: Insufficient documentation

## 2015-06-01 DIAGNOSIS — R112 Nausea with vomiting, unspecified: Secondary | ICD-10-CM | POA: Insufficient documentation

## 2015-06-01 DIAGNOSIS — Z872 Personal history of diseases of the skin and subcutaneous tissue: Secondary | ICD-10-CM | POA: Insufficient documentation

## 2015-06-01 LAB — CBC WITH DIFFERENTIAL/PLATELET
Basophils Absolute: 0 10*3/uL (ref 0.0–0.1)
Basophils Relative: 1 % (ref 0–1)
Eosinophils Absolute: 0.1 10*3/uL (ref 0.0–0.7)
Eosinophils Relative: 2 % (ref 0–5)
HCT: 38.7 % (ref 36.0–46.0)
Hemoglobin: 13.3 g/dL (ref 12.0–15.0)
Lymphocytes Relative: 42 % (ref 12–46)
Lymphs Abs: 2.4 10*3/uL (ref 0.7–4.0)
MCH: 31.2 pg (ref 26.0–34.0)
MCHC: 34.4 g/dL (ref 30.0–36.0)
MCV: 90.8 fL (ref 78.0–100.0)
Monocytes Absolute: 0.5 10*3/uL (ref 0.1–1.0)
Monocytes Relative: 9 % (ref 3–12)
Neutro Abs: 2.6 10*3/uL (ref 1.7–7.7)
Neutrophils Relative %: 46 % (ref 43–77)
Platelets: 277 10*3/uL (ref 150–400)
RBC: 4.26 MIL/uL (ref 3.87–5.11)
RDW: 14.7 % (ref 11.5–15.5)
WBC: 5.6 10*3/uL (ref 4.0–10.5)

## 2015-06-01 LAB — COMPREHENSIVE METABOLIC PANEL
ALT: 14 U/L (ref 14–54)
AST: 21 U/L (ref 15–41)
Albumin: 4.1 g/dL (ref 3.5–5.0)
Alkaline Phosphatase: 113 U/L (ref 38–126)
Anion gap: 9 (ref 5–15)
BUN: <5 mg/dL — ABNORMAL LOW (ref 6–20)
CO2: 24 mmol/L (ref 22–32)
Calcium: 9.7 mg/dL (ref 8.9–10.3)
Chloride: 107 mmol/L (ref 101–111)
Creatinine, Ser: 0.75 mg/dL (ref 0.44–1.00)
GFR calc Af Amer: >60 mL/min (ref >60)
GFR calc non Af Amer: >60 mL/min (ref >60)
Glucose, Bld: 86 mg/dL (ref 65–99)
Potassium: 3.9 mmol/L (ref 3.5–5.1)
Sodium: 140 mmol/L (ref 135–145)
Total Bilirubin: 0.5 mg/dL (ref 0.3–1.2)
Total Protein: 8 g/dL (ref 6.5–8.1)

## 2015-06-01 LAB — URINALYSIS, ROUTINE W REFLEX MICROSCOPIC
Bilirubin Urine: NEGATIVE
Glucose, UA: NEGATIVE mg/dL
Hgb urine dipstick: NEGATIVE
Ketones, ur: NEGATIVE mg/dL
Nitrite: NEGATIVE
Protein, ur: NEGATIVE mg/dL
Specific Gravity, Urine: 1.019 (ref 1.005–1.030)
Urobilinogen, UA: 0.2 mg/dL (ref 0.0–1.0)
pH: 6.5 (ref 5.0–8.0)

## 2015-06-01 LAB — I-STAT BETA HCG BLOOD, ED (MC, WL, AP ONLY): I-stat hCG, quantitative: <5 m[IU]/mL (ref <5)

## 2015-06-01 LAB — URINE MICROSCOPIC-ADD ON

## 2015-06-01 LAB — LIPASE, BLOOD: Lipase: 18 U/L — ABNORMAL LOW (ref 22–51)

## 2015-06-01 MED ORDER — PROMETHAZINE HCL 25 MG PO TABS: 25.0000 mg | ORAL_TABLET | Freq: Four times a day (QID) | ORAL | Status: DC | PRN

## 2015-06-01 MED ORDER — ONDANSETRON HCL 4 MG/2ML IJ SOLN
4.0000 mg | Freq: Once | INTRAMUSCULAR | Status: AC
  Administered 2015-06-01: 4 mg via INTRAVENOUS
  Filled 2015-06-01: qty 2

## 2015-06-01 MED ORDER — FAMOTIDINE IN NACL 20-0.9 MG/50ML-% IV SOLN
20.0000 mg | Freq: Once | INTRAVENOUS | Status: AC
  Administered 2015-06-01: 20 mg via INTRAVENOUS
  Filled 2015-06-01: qty 50

## 2015-06-01 NOTE — ED Notes (Signed)
Kelsey Rodriguez, [place] made aware of pt's fluid challenge

## 2015-06-01 NOTE — Discharge Instructions (Signed)
Take magnesium citrate 150mg  every 12 hours. You should continue to take Sen[place]. If this you have no relief in the the next 24 hours you should start taking miralax until you have a bowel movement. You may also try a fleet enema which are available over -the counter   Please follow with your primary care doctor in the next 2 days for a check-up. They must obtain records for further management.   Do not hesitate to return to the Emergency Department for any new, worsening or concerning symptoms.    Constipation Constipation is when a person:  Poops (has a bowel movement) less than 3 times a week.  Has a hard time pooping.  Has poop that is dry, hard, or bigger than normal. HOME CARE   Eat foods with a lot of fiber in them. This includes fruits, vegetables, beans, and whole grains such as brown rice.  Avoid fatty foods and foods with a lot of sugar. This includes french fries, hamburgers, cookies, candy, and soda.  If you are not getting enough fiber from food, take products with added fiber in them (supplements).  Drink enough fluid to keep your pee (urine) clear or pale yellow.  Exercise on a regular basis, or as told by your doctor.  Go to the restroom when you feel like you need to poop. Do not hold it.  Only take medicine as told by your doctor. Do not take medicines that help you poop (laxatives) without talking to your doctor first. GET HELP RIGHT AWAY IF:   You have bright red blood in your poop (stool).  Your constipation lasts more than 4 days or gets worse.  You have belly (abdominal) or butt (rectal) pain.  You have thin poop (as thin as a pencil).  You lose weight, and it cannot be explained. MAKE SURE YOU:   Understand these instructions.  Will watch your condition.  Will get help right away if you are not doing well or get worse. Document Released: 05/19/2008 Document Revised: 12/06/2013 Document Reviewed: 09/12/2013 [facility] Patient Information 2015  [place], [place]. This information is not intended to replace advice given to you by your health care provider. Make sure you discuss any questions you have with your health care provider.

## 2015-06-01 NOTE — ED Notes (Signed)
Pt reports to the ED for eval of abd pain and N/V x several days. She also reports constipation. She reports she has had this N/V on and off and went to her PCP. She reports they put her back on a pill. Denies any hematemesis, diarrhea, or fevers. Does report intermittent chills. Reports the abd pain is generalized and is worse when she throws up. Pt A&OX4, resp e/u, and skin warm and dry.

## 2015-06-01 NOTE — ED Notes (Signed)
Pt stated " I ate and drank ok. I didn't throw up but I still feel a little nauseated"; will inform [place], [place]

## 2015-06-01 NOTE — ED Provider Notes (Signed)
CSN: [id number]     Arrival date & time 06/01/15  1007 History   First MD Initiated Contact with Patient 06/01/15 [phone]     Chief Complaint  Patient presents with  . Emesis     (Consider location/radiation/quality/duration/timing/severity/associated sxs/prior Treatment) HPI  Blood pressure 146/91, pulse 75, temperature 98.4 F (36.9 C), temperature source Oral, resp. rate 18, height 5\' 4"  (1.626 m), weight 152 lb (68.947 kg), SpO2 100 %.  Kelsey Rodriguez is a 24 y.o. female past medical history significant HIV and primary adrenal insufficiency presents to the ED today with a 2 day h/o emesis, dizziness, and crampy abdominal pain, she has not been able to keep any food or fluids down.  She denies any sick contacts.  She has been constipated the last few days last having a small hard bm 2 days ago, she continues to take miralax daily.  She has had these episodes of vomiting and dizziness for the last month with the last episode ending about 2wks ago.  At this time she had stopped taking her cortef for her adrenal insufficiency and she was restarted, at this time she was also instructed to increase the amount of iron she was taking, she reports she is currently taking all of her medicines, however she is unsure if she took her zofran today that was prescribed for her by her PCP during her last episode of vomiting.    Past Medical History  Diagnosis Date  . Tuberculosis   . HIV (human immunodeficiency virus infection) 02/2012  . Pelvic pain   . 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 Bell's palsy 08/26/2013  . Anemia of chronic disease 03/11/2012  . 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 Laceration of ankle, right 11/18/2012  . Reflux esophagitis 03/11/2012  . 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 Chronic headache   . Vertigo   . Chronic back pain   . Lumbar radiculopathy   . Chronic leg pain     bilateral knees, ankles  . Fatigue    Past Surgical History   Procedure Laterality Date  . Esophagogastroduodenoscopy  03/11/2012    Procedure: ESOPHAGOGASTRODUODENOSCOPY (EGD);  Surgeon: Lafayette Dragon, MD;  Location: [facility] ENDOSCOPY;  Service: Endoscopy;  Laterality: N/A;  . Appendectomy  ~ 2000  . Lung biopsy  02/2012  . Dilation and curettage of uterus  2008  . Esophagogastroduodenoscopy N/A 03/07/2014    Procedure: ESOPHAGOGASTRODUODENOSCOPY (EGD);  Surgeon: Gatha Mayer, MD;  Location: [facility] ENDOSCOPY;  Service: Endoscopy;  Laterality: N/A;   Family History  Problem Relation Age of Onset  . Heart disease Father     Vague not clearly cardiac   History  Substance Use Topics  . Smoking status: Never Smoker   . Smokeless tobacco: Never Used  . Alcohol Use: No     Comment: socially   OB History    Gravida Para Term Preterm AB TAB SAB Ectopic Multiple Living   1 0 0 0 1 0 1 0 0 0      Review of Systems  10 systems reviewed and found to be negative, except as noted in the HPI.   Allergies  Hydrocodone and Tramadol  Home Medications   Prior to Admission medications   Medication Sig Start Date End Date Taking? Authorizing Provider  Darunavir Ethanolate (PREZISTA) 800 MG tablet Take 1 tablet (800 mg total) by mouth daily. 05/01/15  Yes Truman Hayward, MD  dronabinol (MARINOL) 5 MG capsule Take 1  capsule (5 mg total) by mouth 2 (two) times daily before a meal. As needed Patient taking differently: Take 5 mg by mouth 2 (two) times daily as needed (appetite).  12/20/14  Yes Carlyle Basques, MD  emtricitabine-tenofovir (TRUVADA) 200-300 MG per tablet Take 1 tablet by mouth daily. 05/01/15  Yes Truman Hayward, MD  ferrous sulfate 325 (65 FE) MG tablet Take 1 tablet (325 mg total) by mouth 2 (two) times daily with a meal. 04/23/15  Yes Patrecia Pour, MD  hydrocortisone (CORTEF) 5 MG tablet Take 2 tablets in the morning and 1 tablet in the afternoon, no later than 6 PM 05/18/15  Yes Renee A Kuneff, DO  ondansetron (ZOFRAN) 4 MG tablet Take 1 tablet (4  mg total) by mouth every 8 (eight) hours as needed for nausea or vomiting. 05/18/15  Yes Renee A Kuneff, DO  polyethylene glycol powder (GLYCOLAX/MIRALAX) powder Take 17 g by mouth daily. Take daily or as needed to produce 1 normal bowel movement per day. 04/23/15  Yes Patrecia Pour, MD  Probiotic Product (PROBIOTIC DAILY PO) Take 2 tablets by mouth 2 (two) times daily. gummy   Yes Historical Provider, MD  ritonavir (NORVIR) 100 MG TABS tablet Take 1 tablet (100 mg total) by mouth daily. 05/01/15  Yes Truman Hayward, MD  promethazine (PHENERGAN) 25 MG tablet Take 1 tablet (25 mg total) by mouth every 6 (six) hours as needed for nausea or vomiting. 06/01/15   Elmyra Ricks Ellenora Talton, PA-C   BP 146/91 mmHg  Pulse 75  Temp(Src) 98.4 F (36.9 C) (Oral)  Resp 18  Ht 5\' 4"  (1.626 m)  Wt 152 lb (68.947 kg)  BMI 26.08 kg/m2  SpO2 100% Physical Exam  Constitutional: She is oriented to person, place, and time. She appears well-developed and well-nourished. No distress.  HENT:  Head: Normocephalic and atraumatic.  Mouth/Throat: Oropharynx is clear and moist.  Eyes: Conjunctivae and EOM are normal. Pupils are equal, round, and reactive to light.  Neck: Normal range of motion.  Cardiovascular: Normal rate, regular rhythm and intact distal pulses.   Pulmonary/Chest: Effort normal and breath sounds normal. No stridor.  Abdominal: Soft. There is no tenderness.  Musculoskeletal: Normal range of motion.  Neurological: She is alert and oriented to person, place, and time.  Skin: She is not diaphoretic.  Psychiatric: She has a normal mood and affect.  Nursing note and vitals reviewed.   ED Course  Procedures (including critical care time) Labs Review Labs Reviewed  COMPREHENSIVE METABOLIC PANEL - Abnormal; Notable for the following:    BUN <5 (*)    All other components within normal limits  LIPASE, BLOOD - Abnormal; Notable for the following:    Lipase 18 (*)    All other components within normal limits   URINALYSIS, ROUTINE W REFLEX MICROSCOPIC (NOT AT [facility]) - Abnormal; Notable for the following:    APPearance CLOUDY (*)    Leukocytes, UA TRACE (*)    All other components within normal limits  URINE MICROSCOPIC-ADD ON - Abnormal; Notable for the following:    Squamous Epithelial / LPF MANY (*)    Bacteria, UA FEW (*)    All other components within normal limits  CBC WITH DIFFERENTIAL/PLATELET  I-STAT BETA HCG BLOOD, ED ([facility], [facility], [facility] ONLY)    Imaging Review Dg Abd Acute W/chest  06/01/2015   CLINICAL DATA:  Nausea, vomiting for 1 month  EXAM: DG ABDOMEN ACUTE W/ 1V CHEST  COMPARISON:  X-ray 03/21/2015,  CT chest 07/08/2013, CT chest 10/20/2014  FINDINGS: There is no evidence of dilated bowel loops or free intraperitoneal air. There is a moderate amount of stool in the ascending colon. Stable left upper lobe 13 mm nodule unchanged compared with 10/20/2014. No radiopaque calculi or other significant radiographic abnormality is seen. Heart size and mediastinal contours are within normal limits. Both lungs are clear. There is a sclerotic lesion in the left ilium consistent with a bone island.  IMPRESSION: Negative abdominal radiographs.  No acute cardiopulmonary disease.  Stable left upper lobe pulmonary nodule.   Electronically Signed   By: Kathreen Devoid   On: 06/01/2015 11:40     EKG Interpretation None      MDM   Final diagnoses:  Non-intractable vomiting with nausea, vomiting of unspecified type  Constipation, unspecified constipation type    Filed Vitals:   06/01/15 1100 06/01/15 1230 06/01/15 1328 06/01/15 1353  BP: 134/94 131/93 123/89 146/91  Pulse: 69 68 74 75  Temp:      TempSrc:      Resp:  16 16 18   Height:      Weight:      SpO2: 100% 100% 98% 100%    Medications  ondansetron (ZOFRAN) injection 4 mg (4 mg Intravenous Given 06/01/15 1054)  famotidine (PEPCID) IVPB 20 mg premix (0 mg Intravenous Stopped 06/01/15 1124)    Kelsey Rodriguez is a pleasant 24 y.o. female  presenting with multiple episodes of nonbloody, nonbilious, no coffee-ground emesis intermittently over the last month. Patient also reports a constipation with a small, formed, pencillike bowel movement yesterday. I've offered this patient this impaction and she declines. Abdominal exam remains benign. Patient is tolerating by mouth and reports significant improvement after hydration. Counseled this patient on medication which she can take 2 alleviate the constipation in addition to lifestyle changes of hydration, increasing fresh fruits and vegetables, maintaining active lifestyle.  Evaluation does not show pathology that would require ongoing emergent intervention or inpatient treatment. Pt is hemodynamically stable and mentating appropriately. Discussed findings and plan with patient/guardian, who agrees with care plan. All questions answered. Return precautions discussed and outpatient follow up given.   Discharge Medication List as of 06/01/2015  2:01 PM    START taking these medications   Details  promethazine (PHENERGAN) 25 MG tablet Take 1 tablet (25 mg total) by mouth every 6 (six) hours as needed for nausea or vomiting., Starting 06/01/2015, Until Discontinued, Print             Monico Blitz, PA-C 06/01/15 [place], MD 06/02/15 [phone]

## 2015-06-01 NOTE — ED Notes (Signed)
Pt given Sprite and crackers for PO challenge.

## 2015-06-04 ENCOUNTER — Encounter: Payer: Self-pay | Admitting: Internal Medicine

## 2015-06-04 ENCOUNTER — Ambulatory Visit (INDEPENDENT_AMBULATORY_CARE_PROVIDER_SITE_OTHER): Payer: Self-pay | Admitting: Internal Medicine

## 2015-06-04 VITALS — BP 121/77 | HR 89 | Temp 97.7°F | Wt 152.0 lb

## 2015-06-04 DIAGNOSIS — R110 Nausea: Secondary | ICD-10-CM

## 2015-06-04 DIAGNOSIS — B20 Human immunodeficiency virus [HIV] disease: Secondary | ICD-10-CM

## 2015-06-04 DIAGNOSIS — H8110 Benign paroxysmal vertigo, unspecified ear: Secondary | ICD-10-CM

## 2015-06-04 DIAGNOSIS — R42 Dizziness and giddiness: Secondary | ICD-10-CM

## 2015-06-04 MED ORDER — MECLIZINE HCL 32 MG PO TABS: 32.0000 mg | ORAL_TABLET | Freq: Three times a day (TID) | ORAL | Status: DC | PRN

## 2015-06-04 NOTE — Progress Notes (Signed)
Patient ID: Kelsey Rodriguez, female   DOB: 06/02/1991, 24 y.o.   MRN: [id number]       Patient ID: Kelsey Rodriguez, female   DOB: 1991-10-08, 24 y.o.   MRN: [id number]  HPI 24yo F with HIV disease, well-controlled, CD 4 count of 460/VL<20, on truvada/DRVr. She has hx of tx for pulm mTB. She has been to the ED twice, for N/V, unclear if she is taking zofran as needed. She currently takes her meds in the day time. She states that she usually gets nauseated from her dizziness. She states it last for 3 days then resolves, comes back in 1-2 wk. Has not had success with eply maneuvers.  She recently restarted on hydrocort for her adrenal insufficiency in the last 2 wks. She is now back on adap, lost her insurance, due to losing her job.  Outpatient Encounter Prescriptions as of 06/04/2015  Medication Sig  . Darunavir Ethanolate (PREZISTA) 800 MG tablet Take 1 tablet (800 mg total) by mouth daily.  [name] dronabinol (MARINOL) 5 MG capsule Take 1 capsule (5 mg total) by mouth 2 (two) times daily before a meal. As needed (Patient taking differently: Take 5 mg by mouth 2 (two) times daily as needed (appetite). )  . emtricitabine-tenofovir (TRUVADA) 200-300 MG per tablet Take 1 tablet by mouth daily.  . ferrous sulfate 325 (65 FE) MG tablet Take 1 tablet (325 mg total) by mouth 2 (two) times daily with a meal.  . hydrocortisone (CORTEF) 5 MG tablet Take 2 tablets in the morning and 1 tablet in the afternoon, no later than 6 PM  . ondansetron (ZOFRAN) 4 MG tablet Take 1 tablet (4 mg total) by mouth every 8 (eight) hours as needed for nausea or vomiting.  . polyethylene glycol powder (GLYCOLAX/MIRALAX) powder Take 17 g by mouth daily. Take daily or as needed to produce 1 normal bowel movement per day.  . Probiotic Product (PROBIOTIC DAILY PO) Take 2 tablets by mouth 2 (two) times daily. gummy  . promethazine (PHENERGAN) 25 MG tablet Take 1 tablet (25 mg total) by mouth every 6 (six) hours as needed for nausea or vomiting.  .  ritonavir (NORVIR) 100 MG TABS tablet Take 1 tablet (100 mg total) by mouth daily.   No facility-administered encounter medications on file as of 06/04/2015.     Patient Active Problem List   Diagnosis Date Noted  . Nausea with vomiting 05/18/2015  . Overweight (BMI 25.0-29.9) 04/20/2015  . Seasonal allergies 03/20/2015  . Menorrhagia 03/19/2015  . Back pain of lumbar region with sciatica 02/12/2015  . Dizziness and giddiness 01/23/2015  . Primary adrenal insufficiency 01/03/2015  . Fatigue 09/25/2014  . Chest pain 07/07/2013  . Laceration of ankle, right 11/18/2012  . 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 Vaginal discharge 05/30/2012  . Arthralgia 05/20/2012  . HIV (human immunodeficiency virus infection) 03/16/2012  . Dysphagia 03/11/2012  . 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 Iron deficiency anemia 03/11/2012  . Reflux esophagitis 03/11/2012     There are no preventive care reminders to display for this patient.   Review of Systems Positive pertinents listed in hpi. Otherwise 10 point ros is negative Physical Exam   BP 121/77 mmHg  Pulse 89  Temp(Src) 97.7 F (36.5 C) (Oral)  Wt 152 lb (68.947 kg) Physical Exam  Constitutional:  oriented to person, place, and time. appears well-developed and well-nourished. No distress.  HENT: [place]/AT, PERRLA, no scleral icterus Mouth/Throat: Oropharynx is clear and moist. No oropharyngeal exudate.  Cardiovascular: Normal rate, regular rhythm and normal heart sounds. Exam reveals no gallop and no friction rub.  No murmur heard.  Pulmonary/Chest: Effort normal and breath sounds normal. No respiratory distress.  has no wheezes.  Neck = supple, no nuchal rigidity Abdominal: Soft. Bowel sounds are normal.  exhibits no distension. There is no tenderness.  Lymphadenopathy: no cervical adenopathy. No axillary adenopathy Neurological: alert and oriented to person, place, and time.  Skin: Skin is warm and dry. No rash noted. No erythema.    Psychiatric: a normal mood and affect.  behavior is normal.    Lab Results  Component Value Date   CD4TCELL 23* 03/21/2015   Lab Results  Component Value Date   CD4TABS 460 03/21/2015   CD4TABS 650 01/23/2015   CD4TABS 610 10/24/2014   Lab Results  Component Value Date   HIV1RNAQUANT <20 01/23/2015   Lab Results  Component Value Date   HEPBSAB NEGATIVE 03/13/2012   No results found for: RPR  CBC Lab Results  Component Value Date   WBC 5.6 06/01/2015   RBC 4.26 06/01/2015   HGB 13.3 06/01/2015   HCT 38.7 06/01/2015   PLT 277 06/01/2015   MCV 90.8 06/01/2015   MCH 31.2 06/01/2015   MCHC 34.4 06/01/2015   RDW 14.7 06/01/2015   LYMPHSABS 2.4 06/01/2015   MONOABS 0.5 06/01/2015   EOSABS 0.1 06/01/2015   BASOSABS 0.0 06/01/2015   BMET Lab Results  Component Value Date   NA 140 06/01/2015   K 3.9 06/01/2015   CL 107 06/01/2015   CO2 24 06/01/2015   GLUCOSE 86 06/01/2015   BUN <5* 06/01/2015   CREATININE 0.75 06/01/2015   CALCIUM 9.7 06/01/2015   GFRNONAA >60 06/01/2015   GFRAA >60 06/01/2015     Assessment and Plan  Dizziness = had preveiously had cardiac work up which was normal. Clinically it sounds like it could be BPPV. Will do a trial of meclizine to see if it assists her symptoms  Nausea= continue to use zofran or phenergan prn  hiv disease =well controlled. Continue to take daily  rtc in 3 months

## 2015-06-19 ENCOUNTER — Encounter: Payer: Self-pay | Admitting: Family Medicine

## 2015-06-19 ENCOUNTER — Encounter: Payer: Self-pay | Admitting: *Deleted

## 2015-06-19 ENCOUNTER — Telehealth: Payer: Self-pay | Admitting: Licensed Clinical Social Worker

## 2015-06-19 ENCOUNTER — Telehealth: Payer: Self-pay | Admitting: Family Medicine

## 2015-06-19 NOTE — Telephone Encounter (Signed)
Patient was out of work for dizziness, but feels better now since starting that medication and would like to return to work 2 days a week for a total of 16 hours weekly. Patient's job would like her to have a note before returning. Patient would like to pick it up this Thursday if possible.

## 2015-06-19 NOTE — Telephone Encounter (Signed)
Pt called and needs a refill on her Tramadol. Please call patient when ready for pick up. jw

## 2015-06-19 NOTE — Telephone Encounter (Signed)
Called the patient and advised her that Dr Baxter Kelsey Rodriguez gave ok to write letter for her and she can pick it up at the front desk tomorrow.

## 2015-06-28 ENCOUNTER — Encounter: Payer: Self-pay | Admitting: Internal Medicine

## 2015-06-28 ENCOUNTER — Ambulatory Visit (INDEPENDENT_AMBULATORY_CARE_PROVIDER_SITE_OTHER): Payer: Self-pay | Admitting: Internal Medicine

## 2015-06-28 ENCOUNTER — Encounter: Payer: Self-pay | Admitting: Family Medicine

## 2015-06-28 ENCOUNTER — Ambulatory Visit (INDEPENDENT_AMBULATORY_CARE_PROVIDER_SITE_OTHER): Payer: Self-pay | Admitting: Family Medicine

## 2015-06-28 VITALS — BP 104/70 | HR 91 | Temp 98.7°F | Resp 12 | Wt 150.4 lb

## 2015-06-28 VITALS — BP 117/77 | HR 83 | Temp 98.6°F | Ht 64.0 in | Wt 149.0 lb

## 2015-06-28 DIAGNOSIS — E271 Primary adrenocortical insufficiency: Secondary | ICD-10-CM

## 2015-06-28 DIAGNOSIS — M879 Osteonecrosis, unspecified: Secondary | ICD-10-CM | POA: Insufficient documentation

## 2015-06-28 DIAGNOSIS — M25552 Pain in left hip: Secondary | ICD-10-CM

## 2015-06-28 LAB — POTASSIUM: Potassium: 3.7 mEq/L (ref 3.5–5.1)

## 2015-06-28 MED ORDER — HYDROCORTISONE 5 MG PO TABS: ORAL_TABLET | ORAL | Status: DC

## 2015-06-28 MED ORDER — MELOXICAM 15 MG PO TABS
15.0000 mg | ORAL_TABLET | Freq: Every day | ORAL | Status: DC
Start: 2015-06-28 — End: 2015-08-06

## 2015-06-28 MED ORDER — TRAMADOL HCL 50 MG PO TABS: 50.0000 mg | ORAL_TABLET | Freq: Three times a day (TID) | ORAL | Status: DC | PRN

## 2015-06-28 MED ORDER — HYDROXYZINE HCL 10 MG PO TABS: 10.0000 mg | ORAL_TABLET | Freq: Three times a day (TID) | ORAL | Status: DC | PRN

## 2015-06-28 NOTE — Patient Instructions (Signed)
You should get an xray of your left hip if possible. Take tramadol for pain and hydroxyzine for itching. Also take mobic once a day every day to see if this helps. Please have your mom schedule a follow up appointment with me. Thanks!

## 2015-06-28 NOTE — Assessment & Plan Note (Addendum)
Consistent with primary hip joint pathology. No history of sickle cell disease but is on chronic steroids for adrenal insufficiency and ART, though relatively recently started. Doubt septic arthritis. ?Synovitis vs. osteonecrosis vs. OA

## 2015-06-28 NOTE — Progress Notes (Signed)
Patient ID: Kelsey Rodriguez, female   DOB: 30-Jun-1991, 24 y.o.   MRN: [id number]   HPI  Kelsey Rodriguez is a 24 y.o.-year-old female, returning for f/u for primary adrenal insufficiency. Last visit 6 mo ago.  Since last visit, per review of the chart, she has ran out of hydrocortisone for a few months and will had several admissions for dizziness, nausea, vomiting. She did not call us when she ran out of her prescription despite written and verbal instructions given at last visit 2 absolutely make sure that she does not run of out of the medicine!  She saw Dr. Baxter Flattery in 05/2015, and hydrocortisone was restarted - 3 weeks ago >> feels much better.  She also has vertigo >> started Meclizine >> improved.  Reviewed hx: Pt developed a lot of pain in knees and ankles 2 mo ago >> went to the ED 1x>> started Hydrocodone >> sent home >> swelling (gained 30 lbs in 1 mo) >> went to the ED again >> started on diuretic >> sent home >> she did not urinate more >> weight still high but improved, but she feels the edema is better.  She describes AP and constipation at the time when she had the swelling. She also had a sore tongue.She denies N/V.  She had meningitis last year 09/2013 >> HAs since then.  She described no appetite for 3 mo prior to this episode >> lost weight 140 >> 130 lbs. No N/V.  Denies h/o prednisone or other steroids, but per review of the chart, she had a Dexamethasone inj in 08/2013, Toradol inj before a lung Bx 03/2012.  She is on Provera since 09/2013, last inj 09/26/2014.  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   Component     Latest Ref Rng 11/21/2014  Cortisol (Ur), Free     4.0 - 50.0 mcg/24 h <4 mcg/24h  Results received     0.63 - 2.50 g/24 h 1.74  Creatinine, Urine      134.3  Creatinine, 24H Ur     700 - 1800 mg/day 1746   Labs pointed towards primary adrenal insufficiency >> We started Hydrocortisone 10 mg at 9 am and 5 mg at 4-5 pm.   I also checked her sodium, potassium, PRA, and although her on, and fludrocortisone was not needed at last visit based on normal labs: Component     Latest Ref Rng 12/27/2014  Sodium     135 - 145 mEq/L 137  Potassium     3.5 - 5.1 mEq/L 4.1  Chloride     96 - 112 mEq/L 105  CO2     19 - 32 mEq/L 26  Glucose     70 - 99 mg/dL 82  BUN     6 - 23 mg/dL 8  Creatinine     0.40 -  1.20 mg/dL 0.84  Calcium     8.4 - 10.5 mg/dL 9.8  GFR     >60.00 mL/min 105.41  PRA LC/MS/MS     0.25 - 5.82 ng/mL/h 1.60  ALDO / PRA Ratio     0.9 - 28.9 Ratio 3.8  ALDOSTERONE      6   Pt feels better after starting the steroid: - + SOB  - + fatigue - + HAs - + poor sleep - no weight gain/loss - + back and hip pain. - + constipation - on Fe now BID and prescription Miralax - no dry skin - no hair loss - no cold intolerance  - no anxiety/ no depression  Occasional craving salt, some ice craving. No dizziness when stands up.   She was taking Megace and now switched to Marinol - prn.  She has L hip pain >> normal U/S and Xrays >> on pain meds.  She also has a history of HIV, lung TB.  ROS: Constitutional: see HPI Eyes: no blurry vision, no xerophthalmia ENT: no sore throat, no nodules palpated in throat, no dysphagia/odynophagia, no hoarseness Cardiovascular: + CP - positional and with deep breaths - nerve damage from the Sx/SOB/+ occasionalpalpitations/no leg swelling Respiratory: no cough/+ SOB Gastrointestinal: + N/+ V/D/+ C/no  heartburn Musculoskeletal:no  muscle/joint aches Skin: no rashes Neurological: no tremors/numbness/tingling/dizziness, + HA + low libido  I reviewed pt's medications, allergies, PMH, social hx, family hx, and changes were documented in the history of present illness. Otherwise, unchanged from my initial visit note.  Past Medical History  Diagnosis Date  . Tuberculosis   . HIV (human immunodeficiency virus infection) 02/2012  . Pelvic pain   . 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 Bell's palsy 08/26/2013  . Anemia of chronic disease 03/11/2012  . 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 Laceration of ankle, right 11/18/2012  . Reflux esophagitis 03/11/2012  . 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 Chronic headache   . Vertigo   . Chronic back pain   . Lumbar radiculopathy   . Chronic leg pain     bilateral knees, ankles  . Fatigue    Past Surgical History  Procedure Laterality Date  . Esophagogastroduodenoscopy  03/11/2012    Procedure: ESOPHAGOGASTRODUODENOSCOPY (EGD);  Surgeon: Lafayette Dragon, MD;  Location: [facility] ENDOSCOPY;  Service: Endoscopy;  Laterality: N/A;  . Appendectomy  ~ 2000  . Lung biopsy  02/2012  . Dilation and curettage of uterus  2008  . Esophagogastroduodenoscopy N/A 03/07/2014    Procedure: ESOPHAGOGASTRODUODENOSCOPY (EGD);  Surgeon: Gatha Mayer, MD;  Location: [facility] ENDOSCOPY;  Service: Endoscopy;  Laterality: N/A;   History   Social History  . Marital Status: Single    Spouse Name: N/A    Number of Children: 0   Occupational History  . CNA   Social History Main Topics  . Smoking status: Never Smoker   . Smokeless tobacco: Never Used  . Alcohol Use: No     Comment: socially  . Drug Use: No  . Sexual Activity:    Partners: Male   Current Outpatient Prescriptions on File Prior to Visit  Medication Sig Dispense Refill  . Darunavir Ethanolate (PREZISTA) 800 MG tablet Take 1 tablet (800 mg total) by mouth daily. 30  tablet 5  . dronabinol (MARINOL) 5 MG capsule Take 1 capsule (5 mg total) by mouth 2 (two) times daily before a meal. As needed (Patient taking differently: Take 5 mg by  mouth 2 (two) times daily as needed (appetite). ) 30 capsule 0  . emtricitabine-tenofovir (TRUVADA) 200-300 MG per tablet Take 1 tablet by mouth daily. 30 tablet 5  . ferrous sulfate 325 (65 FE) MG tablet Take 1 tablet (325 mg total) by mouth 2 (two) times daily with a meal. 120 tablet 2  . hydrocortisone (CORTEF) 5 MG tablet Take 2 tablets in the morning and 1 tablet in the afternoon, no later than 6 PM 100 tablet 0  . meclizine (ANTIVERT) 32 MG tablet Take 1 tablet (32 mg total) by mouth 3 (three) times daily as needed. 30 tablet 3  . ondansetron (ZOFRAN) 4 MG tablet Take 1 tablet (4 mg total) by mouth every 8 (eight) hours as needed for nausea or vomiting. 20 tablet 0  . polyethylene glycol powder (GLYCOLAX/MIRALAX) powder Take 17 g by mouth daily. Take daily or as needed to produce 1 normal bowel movement per day. 3350 g 1  . Probiotic Product (PROBIOTIC DAILY PO) Take 2 tablets by mouth 2 (two) times daily. gummy    . promethazine (PHENERGAN) 25 MG tablet Take 1 tablet (25 mg total) by mouth every 6 (six) hours as needed for nausea or vomiting. 12 tablet 0  . ritonavir (NORVIR) 100 MG TABS tablet Take 1 tablet (100 mg total) by mouth daily. 30 tablet 5   No current facility-administered medications on file prior to visit.   Allergies  Allergen Reactions  . Hydrocodone     Makes me sick  . Tramadol Itching    All over body    No pertinent family history.  PE: BP 104/70 mmHg  Pulse 91  Temp(Src) 98.7 F (37.1 C) (Oral)  Resp 12  Wt 150 lb 6.4 oz (68.221 kg)  SpO2 97% Body mass index is 25.8 kg/(m^2).  Wt Readings from Last 3 Encounters:  06/04/15 152 lb (68.947 kg)  06/01/15 152 lb (68.947 kg)  05/18/15 154 lb (69.854 kg)   Constitutional: slightly overweight, in NAD Eyes: PERRLA, EOMI, no exophthalmos ENT:  moist mucous membranes, no thyromegaly, no cervical lymphadenopathy Cardiovascular: RRR, No MRG Respiratory: CTA B Gastrointestinal: abdomen soft, NT, ND, BS+ Musculoskeletal: no deformities, strength intact in all 4 Skin: moist, warm, no rashes, no darkening of buccal mucosa Neurological: no tremor with outstretched hands, DTR normal in all 4  ASSESSMENT: 1. Primary adrenal insufficiency - 08/2013 brain MRI did not show a pituitary tumor  PLAN:  1. Primary adrenal insufficiency - reviewed all her pertinent labs >> her am cortisol was normal, but ACTH was high. Her 24h cortisol was very low, also. She was started on HC 10 mg in am and 5 mg in pm >> she does well on this dose. I explained that this is a mandatory medication and she should not run out of it!!!! >> refilled HC x 1 year - we again discussed about what adrenal insufficiency means.  - Approximately 70% of primary AI is autoimmune, but other causes also possible: hemorrhage, neoplasm, infections. We discussed that she has a h/o Bx positive for TB, however, the adrenals were normal on CT abdomen 10/20/2014, not showing calcifications or masses. - we discussed about proper replacement with Hydrocortisone - a bid dose is likely best: 10 or 15 mg in am and 5 mg in pm, best ~3 pm - we again discussed about sick days rules - see instructions below - I again advised her to get a medical bracelet: "Adrenal Insufficiency". Explained why she needs it. - we also  should determine if she needs Fluorinef (mineralocorticoid) - I will check a Na, K, PRA and Aldosterone today. At last visit, they were normal.  Patient Instructions  Please continue Hydrocortisone 10 mg in am and 5 mg in pm, no later than 6 pm.  Sick days rules:  If you cannot keep anything down, including your hydrocortisone medication, please go to the emergency room or your primary care physician office to get steroids injected in the muscle or vein.  If you have a fever (more  than 100 Fahrenheit), please double the dose of your hydrocortisone for the duration of the fever.  Do not run out of your hydrocortisone medication.  Please get a MedAlert bracelet or pendant mentioning: ADRENAL INSUFFICIENCY  Please come back for a follow-up appointment in 1 year..  Component     Latest Ref Rng 06/28/2015          PRA LC/MS/MS     0.25 - 5.82 ng/mL/h 2.41  ALDO / PRA Ratio     0.9 - 28.9 Ratio 4.1  ALDOSTERONE      10  Potassium     3.5 - 5.1 mEq/L 3.7  Labs are normal >> no need for Fludrocortisone for now.

## 2015-06-28 NOTE — Patient Instructions (Signed)
Please continue Hydrocortisone 10 mg in am and 5 mg in pm, no later than 6 pm.  Sick days rules:  If you cannot keep anything down, including your hydrocortisone medication, please go to the emergency room or your primary care physician office to get steroids injected in the muscle or vein.  If you have a fever (more than 100 Fahrenheit), please double the dose of your hydrocortisone for the duration of the fever.  Do not run out of your hydrocortisone medication.  Please get a MedAlert bracelet or pendant mentioning: ADRENAL INSUFFICIENCY  Please come back for a follow-up appointment in 1 year.

## 2015-06-28 NOTE — Progress Notes (Signed)
Subjective: Kelsey Rodriguez is a 24 y.o. female with a history of adrenal insufficiency on chronic steroids, HIV on ART presenting for left leg pain.   Has been waxing and waning but overall worsening since she noticed pain in Feb 2016. Pain is "aching" in her left groin radiating on the front of her thigh stopping before the knee. Helped somewhat by tramadol but has itching with this so hasn't taken it for a month. Walking makes the pain severe, and so does just lifting and rotating her left leg while supine. No fevers, chills, trauma.   Objective: BP 117/77 mmHg  Pulse 83  Temp(Src) 98.6 F (37 C) (Oral)  Ht 5\' 4"  (1.626 m)  Wt 149 lb (67.586 kg)  BMI 25.56 kg/m2  LMP 06/14/2015 (Approximate) Gen: Well-appearing 24 y.o. female in no distress Hip: Full ROM with pain worst on internal rotation and adduction. Full strength. Pelvic alignment unremarkable to inspection and palpation. Antalgic gait without trendelenberg. Greater trochanter without tenderness to palpation. No SI joint tenderness and normal minimal SI movement.  MSK: Left thigh not tender to palpation. Knee normal.   MRI LSpine 03/05/2015: 1. Essentially negative lumbar spine and visible sacrum. Mild right L5 foraminal stenosis. 2. Partially visible tubular structure in the left hemipelvis suspicious for hydrosalpinx with proteinaceous fluid/debris.  XR 02/04/2015: Normal left hip alignment without acute osseous finding or fracture. Hips are symmetric. No degenerative process or arthropathy. Bony pelvis intact. No diastases. Normal SI joints. Left hemipelvis venous phleboliths noted. Normal bowel gas pattern. Impression: No acute finding.  Assessment/Plan: [name] is a 24 y.o. female here for left hip pain.  See problem list for plan.

## 2015-07-03 ENCOUNTER — Ambulatory Visit (HOSPITAL_COMMUNITY)
Admission: RE | Admit: 2015-07-03 | Discharge: 2015-07-03 | Disposition: A | Discharge status: Home / Self Care | Payer: Self-pay | Source: Ambulatory Visit | Attending: Family Medicine | Admitting: Family Medicine

## 2015-07-03 DIAGNOSIS — M25552 Pain in left hip: Secondary | ICD-10-CM | POA: Insufficient documentation

## 2015-07-03 LAB — ALDOSTERONE + RENIN ACTIVITY W/ RATIO
ALDO / PRA Ratio: 4.1 Ratio (ref 0.9–28.9)
Aldosterone: 10 ng/dL
PRA LC/MS/MS: 2.41 ng/mL/h (ref 0.25–5.82)

## 2015-07-05 ENCOUNTER — Other Ambulatory Visit: Payer: Self-pay | Admitting: Family Medicine

## 2015-07-05 ENCOUNTER — Telehealth: Payer: Self-pay | Admitting: Family Medicine

## 2015-07-05 DIAGNOSIS — M25552 Pain in left hip: Secondary | ICD-10-CM

## 2015-07-05 MED ORDER — OXYCODONE-ACETAMINOPHEN 5-325 MG PO TABS: 1.0000 | ORAL_TABLET | ORAL | Status: DC | PRN

## 2015-07-05 NOTE — Assessment & Plan Note (Signed)
XR concerning for AVN - needs MRI.

## 2015-07-05 NOTE — Telephone Encounter (Signed)
Dg Hip Unilat With Pelvis 2-3 Views Left  07/03/2015   CLINICAL DATA:  Left hip pain.  Initial evaluation.  EXAM: DG HIP (WITH OR WITHOUT PELVIS) 2-3V LEFT  COMPARISON:  02/04/2015.  FINDINGS: Deformity noted of the left femoral head with increased density. This suggests possibility of avascular necrosis. No evidence of fracture dislocation. Pelvic calcifications consistent phleboliths noted.  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 MRI the left hip can be obtained to further evaluate.   Electronically Signed   By: Marcello Moores  Register   On: 07/03/2015 10:22   Images reviewed personally and I agree that there is concern for AVN. I have ordered MRI.   I have attempted to reach her at [phone] today and got voicemail. I left message to call back to discuss the results. We will continue to attempt to contact her to schedule the MRI. In the meantime, she should only weight bear as tolerated/partial weightbearing, and use analgesics. If confirmed on MRI, will refer for surgical evaluation.

## 2015-07-09 ENCOUNTER — Other Ambulatory Visit: Payer: Self-pay | Admitting: Family Medicine

## 2015-07-09 ENCOUNTER — Encounter: Payer: Self-pay | Admitting: Family Medicine

## 2015-07-09 ENCOUNTER — Telehealth: Payer: Self-pay | Admitting: Family Medicine

## 2015-07-09 NOTE — Telephone Encounter (Signed)
PT informed of MRI appointment for 07/16/2015 @ 5pm with a 4:45pm arrival at [place], Donneisha Beane [address], [place]

## 2015-07-09 NOTE — Telephone Encounter (Signed)
-----   Message from Patrecia Pour, MD sent at 07/09/2015  4:54 PM EDT ----- Marykay Lex, did we get her scheduled for a hip MRI?  She really needs it as soon as possible. Let me know if I can help.

## 2015-07-09 NOTE — Telephone Encounter (Signed)
Yes it is 07/16/15 at 5:00pm. Katharina Caper, Ta Fair D, CMA

## 2015-07-09 NOTE — Telephone Encounter (Signed)
Mrs. Kelsey Rodriguez is calling because she states that she is in a lot of pain and would like to know if the MRI has been put in for her yet. Please contact the pt at the earliest convenience. Thank you, Fonda Kinder, ASA

## 2015-07-16 ENCOUNTER — Ambulatory Visit (HOSPITAL_COMMUNITY)
Admission: RE | Admit: 2015-07-16 | Discharge: 2015-07-16 | Disposition: A | Discharge status: Home / Self Care | Payer: Self-pay | Source: Ambulatory Visit | Attending: Family Medicine | Admitting: Family Medicine

## 2015-07-16 DIAGNOSIS — M87859 Other osteonecrosis, unspecified femur: Secondary | ICD-10-CM | POA: Insufficient documentation

## 2015-07-16 DIAGNOSIS — M25552 Pain in left hip: Secondary | ICD-10-CM

## 2015-07-18 ENCOUNTER — Telehealth: Payer: Self-pay | Admitting: Family Medicine

## 2015-07-18 DIAGNOSIS — M879 Osteonecrosis, unspecified: Secondary | ICD-10-CM

## 2015-07-18 MED ORDER — OXYCODONE-ACETAMINOPHEN 5-325 MG PO TABS: 2.0000 | ORAL_TABLET | ORAL | Status: DC | PRN

## 2015-07-18 NOTE — Assessment & Plan Note (Signed)
MRI confirms AVN. Will have pt continue nonweight bearing and analgesics and refer to orthopedics to discuss possible surgical interventions.

## 2015-07-18 NOTE — Telephone Encounter (Signed)
I spoke with Kelsey Rodriguez regarding her hip pain which continues to worsen. She is not working and weight bearing as little as possible. She has pain at rest. No fevers or other joint pain. No itching with oxycodone and she believes her current dose is effective.   Mr Hip Left Wo Contrast  07/17/2015   CLINICAL DATA:  Left hip pain for 3 weeks. No known injury. Avascular necrosis by plain films.  EXAM: MR OF THE LEFT HIP WITHOUT CONTRAST  TECHNIQUE: Multiplanar, multisequence MR imaging was performed. No intravenous contrast was administered.  COMPARISON:  Plain films of the left hip 07/03/2015. CT abdomen and pelvis 10/20/2014.  FINDINGS: Bones: As seen on the comparison plain films, there is avascular necrosis of the left femoral head with extensive flattening. Extensive avascular necrosis is also seen in the right femoral head but no associated flattening is identified. Bone marrow signal is otherwise unremarkable.  Articular cartilage and labrum  Articular cartilage:  Appears preserved.  Labrum:  Appears intact.  Joint or bursal effusion  Joint effusion:  Small left hip joint effusion is seen.  Bursae:  Unremarkable.  Muscles and tendons  Muscles and tendons:  Intact.  Other findings  Miscellaneous:  Imaged intrapelvic contents are unremarkable.  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 Changes appear worse on the left where there is marked flattening of the femoral head.   Electronically Signed   By: Inge Rise M.D.   On: 07/17/2015 08:43   MRI images and report reviewed showing advanced stages of osteonecrosis of bilateral femoral heads L>R.   I have discussed these results with Kiaja in detail and the need for referral to an orthopedist to discuss surgical options. I have placed this urgent referral. In the meantime I have emphasized non-weight bearing to minimize continued damage and continued analgesic use as needed. I will write a letter to excuse her from work and  leave it in the front of the clinic for pickup along with an additional prescription for oxycodone.   Levia Waltermire B. Bonner Puna, MD, PGY-3 07/18/2015 4:56 PM

## 2015-07-19 ENCOUNTER — Encounter: Payer: Self-pay | Admitting: Family Medicine

## 2015-07-20 ENCOUNTER — Telehealth: Payer: Self-pay | Admitting: Clinical

## 2015-07-20 NOTE — Telephone Encounter (Signed)
Pt returned CSW's call. Pt confirmed she was denied Medicaid and disability Jan 2016 and again after appealing in April. CSW encouraged pt to contact an attorney who could assist with pts case. CSW informed pt that many attorneys will not charge until after pt has been approved disability. CSW also offered to send a referral to the [facility] who could assist pt in the disability process, pt very appreciative. CSW will mail pt a list of attorneys in [place] as requested.  No additional needs.  Hunt Oris, MSW, [place]

## 2015-07-20 NOTE — Telephone Encounter (Signed)
CSW has LVM for pt to provide information on applying for Medicaid. Hunt Oris, MSW, [place]

## 2015-07-21 ENCOUNTER — Other Ambulatory Visit: Payer: Self-pay | Admitting: Internal Medicine

## 2015-07-21 DIAGNOSIS — B20 Human immunodeficiency virus [HIV] disease: Secondary | ICD-10-CM

## 2015-07-23 ENCOUNTER — Ambulatory Visit: Payer: Self-pay

## 2015-07-23 ENCOUNTER — Encounter: Payer: Self-pay | Admitting: Family Medicine

## 2015-07-26 ENCOUNTER — Other Ambulatory Visit: Payer: Self-pay | Admitting: *Deleted

## 2015-07-26 DIAGNOSIS — B20 Human immunodeficiency virus [HIV] disease: Secondary | ICD-10-CM

## 2015-07-26 MED ORDER — DARUNAVIR ETHANOLATE 800 MG PO TABS: 800.0000 mg | ORAL_TABLET | Freq: Every day | ORAL | Status: DC

## 2015-07-26 MED ORDER — EMTRICITABINE-TENOFOVIR DF 200-300 MG PO TABS: 1.0000 | ORAL_TABLET | Freq: Every day | ORAL | Status: DC

## 2015-08-01 ENCOUNTER — Encounter: Payer: Self-pay | Admitting: Family Medicine

## 2015-08-06 ENCOUNTER — Encounter (HOSPITAL_COMMUNITY): Payer: Self-pay | Admitting: *Deleted

## 2015-08-06 ENCOUNTER — Emergency Department (HOSPITAL_COMMUNITY)
Admission: EM | Admit: 2015-08-06 | Discharge: 2015-08-06 | Disposition: A | Discharge status: Home / Self Care | Payer: Self-pay | Attending: Emergency Medicine | Admitting: Emergency Medicine

## 2015-08-06 DIAGNOSIS — Z3202 Encounter for pregnancy test, result negative: Secondary | ICD-10-CM | POA: Insufficient documentation

## 2015-08-06 DIAGNOSIS — Z79899 Other long term (current) drug therapy: Secondary | ICD-10-CM | POA: Insufficient documentation

## 2015-08-06 DIAGNOSIS — Z8619 Personal history of other infectious and parasitic diseases: Secondary | ICD-10-CM | POA: Insufficient documentation

## 2015-08-06 DIAGNOSIS — Z862 Personal history of diseases of the blood and blood-forming organs and certain disorders involving the immune mechanism: Secondary | ICD-10-CM | POA: Insufficient documentation

## 2015-08-06 DIAGNOSIS — R112 Nausea with vomiting, unspecified: Secondary | ICD-10-CM | POA: Insufficient documentation

## 2015-08-06 DIAGNOSIS — H8110 Benign paroxysmal vertigo, unspecified ear: Secondary | ICD-10-CM | POA: Insufficient documentation

## 2015-08-06 DIAGNOSIS — Z8611 Personal history of tuberculosis: Secondary | ICD-10-CM | POA: Insufficient documentation

## 2015-08-06 DIAGNOSIS — G8929 Other chronic pain: Secondary | ICD-10-CM | POA: Insufficient documentation

## 2015-08-06 DIAGNOSIS — Z21 Asymptomatic human immunodeficiency virus [HIV] infection status: Secondary | ICD-10-CM | POA: Insufficient documentation

## 2015-08-06 DIAGNOSIS — R42 Dizziness and giddiness: Secondary | ICD-10-CM

## 2015-08-06 LAB — COMPREHENSIVE METABOLIC PANEL
ALT: 15 U/L (ref 14–54)
AST: 22 U/L (ref 15–41)
Albumin: 4.2 g/dL (ref 3.5–5.0)
Alkaline Phosphatase: 109 U/L (ref 38–126)
Anion gap: 12 (ref 5–15)
BUN: 5 mg/dL — ABNORMAL LOW (ref 6–20)
CO2: 19 mmol/L — ABNORMAL LOW (ref 22–32)
Calcium: 9.3 mg/dL (ref 8.9–10.3)
Chloride: 107 mmol/L (ref 101–111)
Creatinine, Ser: 0.82 mg/dL (ref 0.44–1.00)
GFR calc Af Amer: >60 mL/min (ref >60)
GFR calc non Af Amer: >60 mL/min (ref >60)
Glucose, Bld: 146 mg/dL — ABNORMAL HIGH (ref 65–99)
Potassium: 3.1 mmol/L — ABNORMAL LOW (ref 3.5–5.1)
Sodium: 138 mmol/L (ref 135–145)
Total Bilirubin: 0.4 mg/dL (ref 0.3–1.2)
Total Protein: 7.9 g/dL (ref 6.5–8.1)

## 2015-08-06 LAB — URINALYSIS, ROUTINE W REFLEX MICROSCOPIC
Bilirubin Urine: NEGATIVE
Glucose, UA: NEGATIVE mg/dL
Hgb urine dipstick: NEGATIVE
Ketones, ur: NEGATIVE mg/dL
Leukocytes, UA: NEGATIVE
Nitrite: NEGATIVE
Protein, ur: NEGATIVE mg/dL
Specific Gravity, Urine: 1.026 (ref 1.005–1.030)
Urobilinogen, UA: 0.2 mg/dL (ref 0.0–1.0)
pH: 6.5 (ref 5.0–8.0)

## 2015-08-06 LAB — CBC
HCT: 35.9 % — ABNORMAL LOW (ref 36.0–46.0)
Hemoglobin: 12.4 g/dL (ref 12.0–15.0)
MCH: 31.6 pg (ref 26.0–34.0)
MCHC: 34.5 g/dL (ref 30.0–36.0)
MCV: 91.3 fL (ref 78.0–100.0)
Platelets: 247 10*3/uL (ref 150–400)
RBC: 3.93 MIL/uL (ref 3.87–5.11)
RDW: 14.8 % (ref 11.5–15.5)
WBC: 7.5 10*3/uL (ref 4.0–10.5)

## 2015-08-06 LAB — LIPASE, BLOOD: Lipase: 20 U/L — ABNORMAL LOW (ref 22–51)

## 2015-08-06 LAB — POC URINE PREG, ED: Preg Test, Ur: NEGATIVE

## 2015-08-06 MED ORDER — ONDANSETRON HCL 8 MG PO TABS: 8.0000 mg | ORAL_TABLET | Freq: Three times a day (TID) | ORAL | Status: DC | PRN

## 2015-08-06 MED ORDER — SODIUM CHLORIDE 0.9 % IV BOLUS (SEPSIS)
1000.0000 mL | Freq: Once | INTRAVENOUS | Status: AC
  Administered 2015-08-06: 1000 mL via INTRAVENOUS

## 2015-08-06 MED ORDER — ONDANSETRON 4 MG PO TBDP
8.0000 mg | ORAL_TABLET | Freq: Once | ORAL | Status: AC
  Administered 2015-08-06: 8 mg via ORAL
  Filled 2015-08-06: qty 2

## 2015-08-06 MED ORDER — MECLIZINE HCL 25 MG PO TABS
25.0000 mg | ORAL_TABLET | Freq: Once | ORAL | Status: AC
  Administered 2015-08-06: 25 mg via ORAL
  Filled 2015-08-06: qty 1

## 2015-08-06 MED ORDER — POTASSIUM CHLORIDE CRYS ER 20 MEQ PO TBCR
60.0000 meq | EXTENDED_RELEASE_TABLET | Freq: Once | ORAL | Status: AC
  Administered 2015-08-06: 60 meq via ORAL
  Filled 2015-08-06: qty 3

## 2015-08-06 MED ORDER — MECLIZINE HCL 32 MG PO TABS: 32.0000 mg | ORAL_TABLET | Freq: Three times a day (TID) | ORAL | Status: DC | PRN

## 2015-08-06 NOTE — ED Provider Notes (Signed)
CSN: [id number]     Arrival date & time 08/06/15  0155 History   First MD Initiated Contact with Patient 08/06/15 [phone]     Chief Complaint  Patient presents with  . Emesis     (Consider location/radiation/quality/duration/timing/severity/associated sxs/prior Treatment) HPI Comments: Kelsey Rodriguez is a 24 y.o. female with a PMHx of TB, HIV, HSV esophagitis, bell's palsy, anemia, acute lymphocytic meningitis, chronic headaches, vertigo, chronic back pain, chronic leg pain, and fatigue, who presents to the ED with complaints of dizziness with head movement or standing, and 6 episodes of nonbloody nonbilious emesis since 8 PM last night. She reports that this is all similar to prior episodes of vertigo, for which she has been seen here in the ER and by her PCP in the past. She reports that the episodes are very brief and typically occur with head movements. She denies any fevers, chills, chest pain, shortness breath, abdominal pain, hematemesis, diarrhea, melena, hematochezia, dysuria, hematuria, vaginal bleeding or discharge, numbness, tingling, weakness, headache, vision changes, recent travel, antibiotics, sick contacts, suspicious food intake, alcohol use, or NSAIDs. Her menstrual cycle began yesterday. She has chronic constipation as well, last bowel movement was yesterday.  Last CD4 count in 03/2015 was 460. Pt regularly follows up with Dr. Carlyle Basques in infectious disease.  Patient is a 24 y.o. female presenting with vomiting. The history is provided by the patient. No language interpreter was used.  Emesis Severity:  Mild Duration:  10 hours Timing:  Intermittent Number of daily episodes:  6x Quality:  Stomach contents Progression:  Improving Chronicity:  Recurrent Recent urination:  Normal Relieved by:  None tried Worsened by:  Nothing tried Ineffective treatments:  None tried Associated symptoms: no abdominal pain, no arthralgias, no chills, no diarrhea, no headaches and no myalgias    Risk factors: no alcohol use, no sick contacts, no suspect food intake and no travel to endemic areas     Past Medical History  Diagnosis Date  . Tuberculosis   . HIV (human immunodeficiency virus infection) 02/2012  . Pelvic pain   . 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 Bell's palsy 08/26/2013  . Anemia of chronic disease 03/11/2012  . 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 Laceration of ankle, right 11/18/2012  . Reflux esophagitis 03/11/2012  . 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 Chronic headache   . Vertigo   . Chronic back pain   . Lumbar radiculopathy   . Chronic leg pain     bilateral knees, ankles  . Fatigue    Past Surgical History  Procedure Laterality Date  . Esophagogastroduodenoscopy  03/11/2012    Procedure: ESOPHAGOGASTRODUODENOSCOPY (EGD);  Surgeon: Lafayette Dragon, MD;  Location: [facility] ENDOSCOPY;  Service: Endoscopy;  Laterality: N/A;  . Appendectomy  ~ 2000  . Lung biopsy  02/2012  . Dilation and curettage of uterus  2008  . Esophagogastroduodenoscopy N/A 03/07/2014    Procedure: ESOPHAGOGASTRODUODENOSCOPY (EGD);  Surgeon: Gatha Mayer, MD;  Location: [facility] ENDOSCOPY;  Service: Endoscopy;  Laterality: N/A;   Family History  Problem Relation Age of Onset  . Heart disease Father     Vague not clearly cardiac   Social History  Substance Use Topics  . Smoking status: Never Smoker   . Smokeless tobacco: Never Used  . Alcohol Use: No     Comment: socially   OB History    Gravida Para Term Preterm AB TAB SAB Ectopic Multiple Living   1 0 0  0 1 0 1 0 0 0     Review of Systems  Constitutional: Negative for fever and chills.  Eyes: Negative for visual disturbance.  Respiratory: Negative for shortness of breath.   Cardiovascular: Negative for chest pain.  Gastrointestinal: Positive for nausea, vomiting and constipation (chronic unchanged). Negative for abdominal pain and diarrhea.  Genitourinary: Negative for dysuria,  hematuria, vaginal bleeding and vaginal discharge.  Musculoskeletal: Negative for myalgias, arthralgias and neck pain.  Skin: Negative for color change.  Allergic/Immunologic: Positive for immunocompromised state (HIV).  Neurological: Positive for dizziness and light-headedness. Negative for syncope, weakness, numbness and headaches.  Psychiatric/Behavioral: Negative for confusion.   10 Systems reviewed and are negative for acute change except as noted in the HPI.    Allergies  Hydrocodone and Tramadol  Home Medications   Prior to Admission medications   Medication Sig Start Date End Date Taking? Authorizing Provider  Darunavir Ethanolate (PREZISTA) 800 MG tablet Take 1 tablet (800 mg total) by mouth daily. 07/26/15  Yes Carlyle Basques, MD  dronabinol (MARINOL) 5 MG capsule Take 1 capsule (5 mg total) by mouth 2 (two) times daily before a meal. As needed Patient taking differently: Take 5 mg by mouth 2 (two) times daily as needed (appetite).  12/20/14  Yes Carlyle Basques, MD  emtricitabine-tenofovir (TRUVADA) 200-300 MG per tablet Take 1 tablet by mouth daily. 07/26/15  Yes Carlyle Basques, MD  ferrous sulfate 325 (65 FE) MG tablet Take 1 tablet (325 mg total) by mouth 2 (two) times daily with a meal. 04/23/15  Yes Patrecia Pour, MD  hydrocortisone (CORTEF) 5 MG tablet Take 2 tablets in the morning and 1 tablet in the afternoon, no later than 6 PM 06/28/15  Yes Philemon Kingdom, MD  hydrOXYzine (ATARAX/VISTARIL) 10 MG tablet Take 1 tablet (10 mg total) by mouth 3 (three) times daily as needed. Patient taking differently: Take 10 mg by mouth 3 (three) times daily as needed for itching or anxiety.  06/28/15  Yes Patrecia Pour, MD  meclizine (ANTIVERT) 32 MG tablet Take 1 tablet (32 mg total) by mouth 3 (three) times daily as needed. Patient taking differently: Take 32 mg by mouth 3 (three) times daily as needed for dizziness.  06/04/15  Yes Carlyle Basques, MD  NORVIR 100 MG TABS tablet TAKE 1 TABLET BY  MOUTH EVERY DAY 07/23/15  Yes Carlyle Basques, MD  ondansetron (ZOFRAN) 4 MG tablet Take 1 tablet (4 mg total) by mouth every 8 (eight) hours as needed for nausea or vomiting. 05/18/15  Yes Renee A Kuneff, DO  oxyCODONE-acetaminophen (PERCOCET/ROXICET) 5-325 MG per tablet Take 2 tablets by mouth every 4 (four) hours as needed for severe pain. 07/18/15  Yes Patrecia Pour, MD  polyethylene glycol powder (GLYCOLAX/MIRALAX) powder Take 17 g by mouth daily. Take daily or as needed to produce 1 normal bowel movement per day. Patient taking differently: Take 17 g by mouth daily as needed for mild constipation. Take daily or as needed to produce 1 normal bowel movement per day. 04/23/15  Yes Patrecia Pour, MD  Probiotic Product (PROBIOTIC DAILY PO) Take 2 tablets by mouth 2 (two) times daily. gummy   Yes Historical Provider, MD  promethazine (PHENERGAN) 25 MG tablet Take 1 tablet (25 mg total) by mouth every 6 (six) hours as needed for nausea or vomiting. 06/01/15  Yes Nicole Pisciotta, PA-C   BP 134/82 mmHg  Pulse 93  Temp(Src) 97.5 F (36.4 C) (Oral)  Resp 18  SpO2 99%  Physical Exam  Constitutional: She is oriented to person, place, and time. Vital signs are normal. She appears well-developed and well-nourished.  Non-toxic appearance. No distress.  Afebrile, nontoxic, NAD  HENT:  Head: Normocephalic and atraumatic.  Mouth/Throat: Oropharynx is clear and moist and mucous membranes are normal.  Eyes: Conjunctivae and EOM are normal. Pupils are equal, round, and reactive to light. Right eye exhibits no discharge. Left eye exhibits no discharge.  PERRL, EOMI, no nystagmus, no visual field deficits   Neck: Normal range of motion. Neck supple.  No meningismus  Cardiovascular: Normal rate, regular rhythm, normal heart sounds and intact distal pulses.  Exam reveals no gallop and no friction rub.   No murmur heard. Pulmonary/Chest: Effort normal and breath sounds normal. No respiratory distress. She has no decreased  breath sounds. She has no wheezes. She has no rhonchi. She has no rales.  Abdominal: Soft. Normal appearance and bowel sounds are normal. She exhibits no distension. There is no tenderness. There is no rigidity, no rebound, no guarding, no CVA tenderness, no tenderness at McBurney's point and negative Murphy's sign.  Soft, NTND, +BS throughout, no r/g/r, neg murphy's, neg mcburney's, no CVA TTP   Musculoskeletal: Normal range of motion.  MAE x4 Strength and sensation grossly intact Distal pulses intact Gait steady   Neurological: She is alert and oriented to person, place, and time. She has normal strength. No cranial nerve deficit or sensory deficit. Coordination and gait normal. GCS eye subscore is 4. GCS verbal subscore is 5. GCS motor subscore is 6.  CN 2-12 grossly intact A&O x4 GCS 15 Sensation and strength intact Gait nonataxic including with tandem walking Coordination with finger-to-nose WNL Neg pronator drift   Skin: Skin is warm, dry and intact. No rash noted.  Psychiatric: She has a normal mood and affect.  Nursing note and vitals reviewed.   ED Course  Procedures (including critical care time) 07:33 Orthostatic Vital Signs AJ  Orthostatic Lying  - BP- Lying: 118/81 mmHg ; Pulse- Lying: 84  Orthostatic Sitting - BP- Sitting: 121/84 mmHg ; Pulse- Sitting: 64  Orthostatic Standing at 0 minutes - BP- Standing at 0 minutes: 120/83 mmHg ; Pulse- Standing at 0 minutes: 89       Labs Review Labs Reviewed  LIPASE, BLOOD - Abnormal; Notable for the following:    Lipase 20 (*)    All other components within normal limits  COMPREHENSIVE METABOLIC PANEL - Abnormal; Notable for the following:    Potassium 3.1 (*)    CO2 19 (*)    Glucose, Bld 146 (*)    BUN 5 (*)    All other components within normal limits  CBC - Abnormal; Notable for the following:    HCT 35.9 (*)    All other components within normal limits  URINALYSIS, ROUTINE W REFLEX MICROSCOPIC (NOT AT [facility]) -  Abnormal; Notable for the following:    APPearance CLOUDY (*)    All other components within normal limits  POC URINE PREG, ED    Imaging Review No results found. I have personally reviewed and evaluated these images and lab results as part of my medical decision-making.   EKG Interpretation None      MDM   Final diagnoses:  Vertigo  BPPV (benign paroxysmal positional vertigo), unspecified laterality  Non-intractable vomiting with nausea, vomiting of unspecified type    24 y.o. female here with recurrent vertigo and n/v. States this is the same as her prior ER visit. Meclizine listed  on her med list. Pt neurologically intact with no focal deficits. Dizziness seems to be with standing/head movement, consistent with BPPV or orthostasis. Will give zofran and meclizine here, and reassess shortly. Labs showing mildly low K, will replete here.   8:41 AM Orthostatic VS neg. Upreg neg. U/A neg. Pt feeling better, tolerating PO well, no ongoing symptoms. Will d/c home with zofran and meclizine. Will have her f/up with PCP in 3-5 days for recheck. I explained the diagnosis and have given explicit precautions to return to the ER including for any other new or worsening symptoms. The patient understands and accepts the medical plan as it's been dictated and I have answered their questions. Discharge instructions concerning home care and prescriptions have been given. The patient is STABLE and is discharged to home in good condition.  BP 110/75 mmHg  Pulse 58  Temp(Src) 97.5 F (36.4 C) (Oral)  Resp 18  SpO2 100%  Meds ordered this encounter  Medications  . meclizine (ANTIVERT) tablet 25 mg    Sig:   . ondansetron (ZOFRAN-ODT) disintegrating tablet 8 mg    Sig:   . sodium chloride 0.9 % bolus 1,000 mL    Sig:   . potassium chloride SA (K-DUR,KLOR-CON) CR tablet 60 mEq    Sig:   . meclizine (ANTIVERT) 32 MG tablet    Sig: Take 1 tablet (32 mg total) by mouth 3 (three) times daily as  needed for dizziness or nausea.    Dispense:  30 tablet    Refill:  0    Order Specific Question:  Supervising Provider    Answer:  MILLER, BRIAN [[id number]]  . ondansetron (ZOFRAN) 8 MG tablet    Sig: Take 1 tablet (8 mg total) by mouth every 8 (eight) hours as needed for nausea or vomiting.    Dispense:  15 tablet    Refill:  0    Order Specific Question:  Supervising Provider    Answer:  Noemi Chapel [[id number]]      Hiroto Saltzman Camprubi-Soms, PA-C 08/06/15 [id number]  Everlene Balls, MD 08/07/15 [phone]

## 2015-08-06 NOTE — ED Notes (Signed)
Pt c/o emesis since 8p. Also feels dizzy.

## 2015-08-06 NOTE — Discharge Instructions (Signed)
Your symptoms are likely due to your vertigo. Take meclizine as directed as needed for dizziness, and use zofran as directed as needed for nausea. Stay well hydrated. Follow up with your regular doctor in 1 week for recheck of symptoms. Return to the ER for changes or worsening symptoms.   Dizziness  Dizziness means you feel unsteady or lightheaded. You might feel like you are going to pass out (faint). HOME CARE   Drink enough fluids to keep your pee (urine) clear or pale yellow.  Take your medicines exactly as told by your doctor. If you take blood pressure medicine, always stand up slowly from the lying or sitting position. Hold on to something to steady yourself.  If you need to stand in one place for a long time, move your legs often. Tighten and relax your leg muscles.  Have someone stay with you until you feel okay.  Do not drive or use heavy machinery if you feel dizzy.  Do not drink alcohol. GET HELP RIGHT AWAY IF:   You feel dizzy or lightheaded and it gets worse.  You feel sick to your stomach (nauseous), or you throw up (vomit).  You have trouble talking or walking.  You feel weak or have trouble using your arms, hands, or legs.  You cannot think clearly or have trouble forming sentences.  You have chest pain, belly (abdominal) pain, sweating, or you are short of breath.  Your vision changes.  You are bleeding.  You have problems from your medicine that seem to be getting worse. MAKE SURE YOU:   Understand these instructions.  Will watch your condition.  Will get help right away if you are not doing well or get worse. Document Released: 11/20/2011 Document Revised: 02/23/2012 Document Reviewed: 11/20/2011 [facility] Patient Information 2015 [place], [place]. This information is not intended to replace advice given to you by your health care provider. Make sure you discuss any questions you have with your health care provider.  Benign Positional Vertigo Vertigo  means you feel like you or your surroundings are moving when they are not. Benign positional vertigo is the most common form of vertigo. Benign means that the cause of your condition is not serious. Benign positional vertigo is more common in older adults. CAUSES  Benign positional vertigo is the result of an upset in the labyrinth system. This is an area in the middle ear that helps control your balance. This may be caused by a viral infection, head injury, or repetitive motion. However, often no specific cause is found. SYMPTOMS  Symptoms of benign positional vertigo occur when you move your head or eyes in different directions. Some of the symptoms may include:  Loss of balance and falls.  Vomiting.  Blurred vision.  Dizziness.  Nausea.  Involuntary eye movements (nystagmus). DIAGNOSIS  Benign positional vertigo is usually diagnosed by physical exam. If the specific cause of your benign positional vertigo is unknown, your caregiver may perform imaging tests, such as magnetic resonance imaging (MRI) or computed tomography (CT). TREATMENT  Your caregiver may recommend movements or procedures to correct the benign positional vertigo. Medicines such as meclizine, benzodiazepines, and medicines for nausea may be used to treat your symptoms. In rare cases, if your symptoms are caused by certain conditions that affect the inner ear, you may need surgery. HOME CARE INSTRUCTIONS   Follow your caregiver's instructions.  Move slowly. Do not make sudden body or head movements.  Avoid driving.  Avoid operating heavy machinery.  Avoid performing  any tasks that would be dangerous to you or others during a vertigo episode.  Drink enough fluids to keep your urine clear or pale yellow. SEEK IMMEDIATE MEDICAL CARE IF:   You develop problems with walking, weakness, numbness, or using your arms, hands, or legs.  You have difficulty speaking.  You develop severe headaches.  Your nausea or  vomiting continues or gets worse.  You develop visual changes.  Your family or friends notice any behavioral changes.  Your condition gets worse.  You have a fever.  You develop a stiff neck or sensitivity to light. MAKE SURE YOU:   Understand these instructions.  Will watch your condition.  Will get help right away if you are not doing well or get worse. Document Released: 09/08/2006 Document Revised: 02/23/2012 Document Reviewed: 08/21/2011 [facility] Patient Information 2015 [place], [place]. This information is not intended to replace advice given to you by your health care provider. Make sure you discuss any questions you have with your health care provider.  Epley Maneuver Self-Care WHAT IS THE EPLEY MANEUVER? The Epley maneuver is an exercise you can do to relieve symptoms of benign paroxysmal positional vertigo (BPPV). This condition is often just referred to as vertigo. BPPV is caused by the movement of tiny crystals (canaliths) inside your inner ear. The accumulation and movement of canaliths in your inner ear causes a sudden spinning sensation (vertigo) when you move your head to certain positions. Vertigo usually lasts about 30 seconds. BPPV usually occurs in just one ear. If you get vertigo when you lie on your left side, you probably have BPPV in your left ear. Your health care provider can tell you which ear is involved.  BPPV may be caused by a head injury. Many people older than 50 get BPPV for unknown reasons. If you have been diagnosed with BPPV, your health care provider may teach you how to do this maneuver. BPPV is not life threatening (benign) and usually goes away in time.  WHEN SHOULD I PERFORM THE EPLEY MANEUVER? You can do this maneuver at home whenever you have symptoms of vertigo. You may do the Epley maneuver up to 3 times a day until your symptoms of vertigo go away. HOW SHOULD I DO THE EPLEY MANEUVER?  Sit on the edge of a bed or table with your back  straight. Your legs should be extended or hanging over the edge of the bed or table.   Turn your head halfway toward the affected ear.   Lie backward quickly with your head turned until you are lying flat on your back. You may want to position a pillow under your shoulders.   Hold this position for 30 seconds. You may experience an attack of vertigo. This is normal. Hold this position until the vertigo stops.  Then turn your head to the opposite direction until your unaffected ear is facing the floor.   Hold this position for 30 seconds. You may experience an attack of vertigo. This is normal. Hold this position until the vertigo stops.  Now turn your whole body to the same side as your head. Hold for another 30 seconds.   You can then sit back up. ARE THERE RISKS TO THIS MANEUVER? In some cases, you may have other symptoms (such as changes in your vision, weakness, or numbness). If you have these symptoms, stop doing the maneuver and call your health care provider. Even if doing these maneuvers relieves your vertigo, you may still have dizziness. Dizziness is  the sensation of light-headedness but without the sensation of movement. Even though the Epley maneuver may relieve your vertigo, it is possible that your symptoms will return within 5 years. WHAT SHOULD I DO AFTER THIS MANEUVER? After doing the Epley maneuver, you can return to your normal activities. Ask your doctor if there is anything you should do at home to prevent vertigo. This may include:  Sleeping with two or more pillows to keep your head elevated.  Not sleeping on the side of your affected ear.  Getting up slowly from bed.  Avoiding sudden movements during the day.  Avoiding extreme head movement, like looking up or bending over.  Wearing a cervical collar to prevent sudden head movements. WHAT SHOULD I DO IF MY SYMPTOMS GET WORSE? Call your health care provider if your vertigo gets worse. Call your provider  right way if you have other symptoms, including:   Nausea.  Vomiting.  Headache.  Weakness.  Numbness.  Vision changes. Document Released: 12/06/2013 Document Reviewed: 12/06/2013 [facility] Patient Information 2015 [place], [place]. This information is not intended to replace advice given to you by your health care provider. Make sure you discuss any questions you have with your health care provider.  Nausea and Vomiting Nausea is a sick feeling that often comes before throwing up (vomiting). Vomiting is a reflex where stomach contents come out of your mouth. Vomiting can cause severe loss of body fluids (dehydration). Children and elderly adults can become dehydrated quickly, especially if they also have diarrhea. Nausea and vomiting are symptoms of a condition or disease. It is important to find the cause of your symptoms. CAUSES   Direct irritation of the stomach lining. This irritation can result from increased acid production (gastroesophageal reflux disease), infection, food poisoning, taking certain medicines (such as nonsteroidal anti-inflammatory drugs), alcohol use, or tobacco use.  Signals from the brain.These signals could be caused by a headache, heat exposure, an inner ear disturbance, increased pressure in the brain from injury, infection, a tumor, or a concussion, pain, emotional stimulus, or metabolic problems.  An obstruction in the gastrointestinal tract (bowel obstruction).  Illnesses such as diabetes, hepatitis, gallbladder problems, appendicitis, kidney problems, cancer, sepsis, atypical symptoms of a heart attack, or eating disorders.  Medical treatments such as chemotherapy and radiation.  Receiving medicine that makes you sleep (general anesthetic) during surgery. DIAGNOSIS Your caregiver may ask for tests to be done if the problems do not improve after a few days. Tests may also be done if symptoms are severe or if the reason for the nausea and vomiting is not  clear. Tests may include:  Urine tests.  Blood tests.  Stool tests.  Cultures (to look for evidence of infection).  X-rays or other imaging studies. Test results can help your caregiver make decisions about treatment or the need for additional tests. TREATMENT You need to stay well hydrated. Drink frequently but in small amounts.You may wish to drink water, sports drinks, clear broth, or eat frozen ice pops or gelatin dessert to help stay hydrated.When you eat, eating slowly may help prevent nausea.There are also some antinausea medicines that may help prevent nausea. HOME CARE INSTRUCTIONS   Take all medicine as directed by your caregiver.  If you do not have an appetite, do not force yourself to eat. However, you must continue to drink fluids.  If you have an appetite, eat a normal diet unless your caregiver tells you differently.  Eat a variety of complex carbohydrates (rice, wheat, potatoes, bread),  lean meats, yogurt, fruits, and vegetables.  Avoid high-fat foods because they are more difficult to digest.  Drink enough water and fluids to keep your urine clear or pale yellow.  If you are dehydrated, ask your caregiver for specific rehydration instructions. Signs of dehydration may include:  Severe thirst.  Dry lips and mouth.  Dizziness.  Dark urine.  Decreasing urine frequency and amount.  Confusion.  Rapid breathing or pulse. SEEK IMMEDIATE MEDICAL CARE IF:   You have blood or brown flecks (like coffee grounds) in your vomit.  You have black or bloody stools.  You have a severe headache or stiff neck.  You are confused.  You have severe abdominal pain.  You have chest pain or trouble breathing.  You do not urinate at least once every 8 hours.  You develop cold or clammy skin.  You continue to vomit for longer than 24 to 48 hours.  You have a fever. MAKE SURE YOU:   Understand these instructions.  Will watch your condition.  Will get help  right away if you are not doing well or get worse. Document Released: 12/01/2005 Document Revised: 02/23/2012 Document Reviewed: 04/30/2011 [facility] Patient Information 2015 [place], [place]. This information is not intended to replace advice given to you by your health care provider. Make sure you discuss any questions you have with your health care provider.  Vertigo Vertigo means you feel like you are moving when you are not. Vertigo can make you feel like things around you are moving when they are not. This problem often goes away on its own.  HOME CARE   Follow your doctor's instructions.  Avoid driving.  Avoid using heavy machinery.  Avoid doing any activity that could be dangerous if you have a vertigo attack.  Tell your doctor if a medicine seems to cause your vertigo. GET HELP RIGHT AWAY IF:   Your medicines do not help or make you feel worse.  You have trouble talking or walking.  You feel weak or have trouble using your arms, hands, or legs.  You have bad headaches.  You keep feeling sick to your stomach (nauseous) or throwing up (vomiting).  Your vision changes.  A family member notices changes in your behavior.  Your problems get worse. MAKE SURE YOU:  Understand these instructions.  Will watch your condition.  Will get help right away if you are not doing well or get worse. Document Released: 09/09/2008 Document Revised: 02/23/2012 Document Reviewed: 06/19/2011 [facility] Patient Information 2015 [place], [place]. This information is not intended to replace advice given to you by your health care provider. Make sure you discuss any questions you have with your health care provider.

## 2015-08-15 ENCOUNTER — Telehealth: Payer: Self-pay | Admitting: *Deleted

## 2015-08-15 ENCOUNTER — Encounter: Payer: Self-pay | Admitting: Family Medicine

## 2015-08-15 ENCOUNTER — Encounter (HOSPITAL_COMMUNITY): Payer: Self-pay | Admitting: *Deleted

## 2015-08-15 ENCOUNTER — Emergency Department (INDEPENDENT_AMBULATORY_CARE_PROVIDER_SITE_OTHER)
Admission: EM | Admit: 2015-08-15 | Discharge: 2015-08-15 | Disposition: A | Discharge status: Home / Self Care | Payer: Self-pay | Source: Home / Self Care | Attending: Family Medicine | Admitting: Family Medicine

## 2015-08-15 DIAGNOSIS — M8700 Idiopathic aseptic necrosis of unspecified bone: Secondary | ICD-10-CM

## 2015-08-15 DIAGNOSIS — M879 Osteonecrosis, unspecified: Secondary | ICD-10-CM

## 2015-08-15 MED ORDER — NAPROXEN 500 MG PO TABS: 500.0000 mg | ORAL_TABLET | Freq: Two times a day (BID) | ORAL | Status: DC

## 2015-08-15 NOTE — Telephone Encounter (Signed)
Patient called requesting sooner appointment with Dr. Baxter Flattery because she said she is in pain and having difficulty walking. Her next appt is 09/06/15 and I explained this is the next available. She has a PCP but could not get in there until October. Patient stated she would go to Urgent Care to be seen. Kelsey Rodriguez

## 2015-08-15 NOTE — ED Notes (Signed)
Pt  Reports  A  History  Of  A vascular  Necrosis      Sees  Family  Practice          No appts  Till oct  Pt  Reports    Pain

## 2015-08-15 NOTE — ED Provider Notes (Signed)
CSN: [id number]     Arrival date & time 08/15/15  1303 History   First MD Initiated Contact with Patient 08/15/15 1331     Chief Complaint  Patient presents with  . Hip Pain   (Consider location/radiation/quality/duration/timing/severity/associated sxs/prior Treatment) Patient is a 24 y.o. female presenting with hip pain. The history is provided by the patient.  Hip Pain This is a chronic problem. 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 The problem has been gradually worsening.    Past Medical History  Diagnosis Date  . Tuberculosis   . HIV (human immunodeficiency virus infection) 02/2012  . Pelvic pain   . 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 Bell's palsy 08/26/2013  . Anemia of chronic disease 03/11/2012  . 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 Laceration of ankle, right 11/18/2012  . Reflux esophagitis 03/11/2012  . 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 Chronic headache   . Vertigo   . Chronic back pain   . Lumbar radiculopathy   . Chronic leg pain     bilateral knees, ankles  . Fatigue    Past Surgical History  Procedure Laterality Date  . Esophagogastroduodenoscopy  03/11/2012    Procedure: ESOPHAGOGASTRODUODENOSCOPY (EGD);  Surgeon: Lafayette Dragon, MD;  [facility];  Service: Endoscopy;  Laterality: N/A;  . Appendectomy  ~ 2000  . Lung biopsy  02/2012  . Dilation and curettage of uterus  2008  . Esophagogastroduodenoscopy N/A 03/07/2014    Procedure: ESOPHAGOGASTRODUODENOSCOPY (EGD);  Surgeon: Gatha Mayer, MD;  Location: [facility] ENDOSCOPY;  Service: Endoscopy;  Laterality: N/A;   Family History  Problem Relation Age of Onset  . Heart disease Father     Vague not clearly cardiac   Social History  Substance Use Topics  . Smoking status: Never Smoker   . Smokeless tobacco: Never Used  . Alcohol Use: No    Comment: socially   OB History    Gravida Para Term Preterm AB TAB SAB Ectopic Multiple Living   1 0 0 0 1 0 1 0 0 0      Review of Systems  Constitutional: Negative.   Gastrointestinal: Negative.   Musculoskeletal: Positive for joint swelling and gait problem.  Skin: Negative.     Allergies  Hydrocodone and Tramadol  Home Medications   Prior to Admission medications   Medication Sig Start Date End Date Taking? Authorizing Provider  Darunavir Ethanolate (PREZISTA) 800 MG tablet Take 1 tablet (800 mg total) by mouth daily. 07/26/15   Carlyle Basques, MD  dronabinol (MARINOL) 5 MG capsule Take 1 capsule (5 mg total) by mouth 2 (two) times daily before a meal. As needed Patient taking differently: Take 5 mg by mouth 2 (two) times daily as needed (appetite).  12/20/14   Carlyle Basques, MD  emtricitabine-tenofovir (TRUVADA) 200-300 MG per tablet Take 1 tablet by mouth daily. 07/26/15   Carlyle Basques, MD  ferrous sulfate 325 (65 FE) MG tablet Take 1 tablet (325 mg total) by mouth 2 (two) times daily with a meal. 04/23/15   Patrecia Pour, MD  hydrocortisone (CORTEF) 5 MG tablet Take 2 tablets in the morning and 1 tablet in the afternoon, no later than 6 PM 06/28/15   Philemon Kingdom, MD  hydrOXYzine (ATARAX/VISTARIL) 10 MG tablet Take 1 tablet (10 mg total) by mouth 3 (three) times  daily as needed. Patient taking differently: Take 10 mg by mouth 3 (three) times daily as needed for itching or anxiety.  06/28/15   Patrecia Pour, MD  meclizine (ANTIVERT) 32 MG tablet Take 1 tablet (32 mg total) by mouth 3 (three) times daily as needed. Patient taking differently: Take 32 mg by mouth 3 (three) times daily as needed for dizziness.  06/04/15   Carlyle Basques, MD  meclizine (ANTIVERT) 32 MG tablet Take 1 tablet (32 mg total) by mouth 3 (three) times daily as needed for dizziness or nausea. 08/06/15   Mercedes Camprubi-Soms, PA-C  naproxen (NAPROSYN) 500 MG tablet Take 1 tablet (500 mg total) by mouth 2 (two)  times daily with a meal. 08/15/15   Billy Fischer, MD  NORVIR 100 MG TABS tablet TAKE 1 TABLET BY MOUTH EVERY DAY 07/23/15   Carlyle Basques, MD  ondansetron (ZOFRAN) 4 MG tablet Take 1 tablet (4 mg total) by mouth every 8 (eight) hours as needed for nausea or vomiting. 05/18/15   Renee A Kuneff, DO  ondansetron (ZOFRAN) 8 MG tablet Take 1 tablet (8 mg total) by mouth every 8 (eight) hours as needed for nausea or vomiting. 08/06/15   Mercedes Camprubi-Soms, PA-C  oxyCODONE-acetaminophen (PERCOCET/ROXICET) 5-325 MG per tablet Take 2 tablets by mouth every 4 (four) hours as needed for severe pain. 07/18/15   Patrecia Pour, MD  polyethylene glycol powder (GLYCOLAX/MIRALAX) powder Take 17 g by mouth daily. Take daily or as needed to produce 1 normal bowel movement per day. Patient taking differently: Take 17 g by mouth daily as needed for mild constipation. Take daily or as needed to produce 1 normal bowel movement per day. 04/23/15   Patrecia Pour, MD  Probiotic Product (PROBIOTIC DAILY PO) Take 2 tablets by mouth 2 (two) times daily. gummy    Historical Provider, MD  promethazine (PHENERGAN) 25 MG tablet Take 1 tablet (25 mg total) by mouth every 6 (six) hours as needed for nausea or vomiting. 06/01/15   Monico Blitz, PA-C   Meds Ordered and Administered this Visit  Medications - No data to display  BP 129/85 mmHg  Pulse 78  Temp(Src) 98.1 F (36.7 C) (Oral)  Resp 16  SpO2 99%  LMP 08/15/2015 No data found.   Physical Exam  Constitutional: She is oriented to person, place, and time. She appears well-developed and well-nourished. No distress.  Musculoskeletal: She exhibits tenderness.       Left hip: She exhibits decreased range of motion, tenderness, bony tenderness, swelling, crepitus and deformity.  Neurological: She is alert and oriented to person, place, and time.  Skin: Skin is warm and dry.  Nursing note and vitals reviewed.   ED Course  Procedures (including critical care time)  Labs  Review Labs Reviewed - No data to display  Imaging Review No results found.   Visual Acuity Review  Right Eye Distance:   Left Eye Distance:   Bilateral Distance:    Right Eye Near:   Left Eye Near:    Bilateral Near:         MDM   1. AVN (avascular necrosis of bone)        Billy Fischer, MD 08/15/15 1352

## 2015-08-22 ENCOUNTER — Other Ambulatory Visit: Payer: Self-pay | Admitting: Family Medicine

## 2015-08-22 ENCOUNTER — Telehealth: Payer: Self-pay | Admitting: Family Medicine

## 2015-08-22 DIAGNOSIS — M879 Osteonecrosis, unspecified: Secondary | ICD-10-CM

## 2015-08-22 MED ORDER — OXYCODONE-ACETAMINOPHEN 5-325 MG PO TABS: ORAL_TABLET | ORAL | Status: DC

## 2015-08-22 NOTE — Progress Notes (Signed)
Handwritten rx given I discussed with dr. Diona Rodriguez documentation. They are working diligently to address her pain issues (recent MRIshows AVN.) He is out of office at [facility] so I am writing rx for him and he will ensure follow up.

## 2015-08-22 NOTE — Telephone Encounter (Signed)
Spoke with Dr. Bonner Puna, he is ok with refill being given.  Since he is in the hospital Dr. Nori Riis has agreed to write Rx.  Pt informed that rx was ready for pickup. Fleeger, Salome Spotted, CMA

## 2015-08-22 NOTE — Telephone Encounter (Signed)
Pt calling because she needs a refill on her pain medication, however, her PCP isn't available until 10/7. Would like a refill to hold her over if possible, or to see a different provider sooner. Thank you, Fonda Kinder, ASA

## 2015-08-23 ENCOUNTER — Other Ambulatory Visit: Payer: Self-pay

## 2015-08-29 NOTE — Progress Notes (Signed)
Notified [facility] via fax.  Shataria Crist M, RN  

## 2015-09-06 ENCOUNTER — Encounter: Payer: Self-pay | Admitting: *Deleted

## 2015-09-06 ENCOUNTER — Ambulatory Visit (INDEPENDENT_AMBULATORY_CARE_PROVIDER_SITE_OTHER): Payer: Self-pay | Admitting: Internal Medicine

## 2015-09-06 ENCOUNTER — Encounter: Payer: Self-pay | Admitting: Internal Medicine

## 2015-09-06 VITALS — BP 118/77 | HR 82 | Temp 98.2°F | Ht 62.0 in | Wt 152.0 lb

## 2015-09-06 DIAGNOSIS — Z23 Encounter for immunization: Secondary | ICD-10-CM

## 2015-09-06 DIAGNOSIS — B20 Human immunodeficiency virus [HIV] disease: Secondary | ICD-10-CM

## 2015-09-06 DIAGNOSIS — R110 Nausea: Secondary | ICD-10-CM

## 2015-09-06 DIAGNOSIS — M8700 Idiopathic aseptic necrosis of unspecified bone: Secondary | ICD-10-CM

## 2015-09-06 DIAGNOSIS — M879 Osteonecrosis, unspecified: Secondary | ICD-10-CM

## 2015-09-06 MED ORDER — ONDANSETRON 8 MG PO TBDP
8.0000 mg | ORAL_TABLET | Freq: Three times a day (TID) | ORAL | Status: DC | PRN
Start: 2015-09-06 — End: 2016-01-10

## 2015-09-06 NOTE — Progress Notes (Signed)
Patient ID: Kelsey Rodriguez, female   DOB: Feb 11, 1991, 24 y.o.   MRN: [id number]       Patient ID: Kelsey Rodriguez, female   DOB: 12/18/1990, 24 y.o.   MRN: [id number]  HPI 24yo F with HIV disease, hx of pulm mTB, AI, GERD, iron def anemia, CD 4 count of 460/VL<20 (feb 2016). She reports having difficulty taking meds every day due to nausea but has not taken anti-emetics and occasionally takes on empty stomach. She also states that she has had worsening left hip pain for which she was diagnosed with AVN, having difficulty ambulating from parking lot to classes at [employer].  Outpatient Encounter Prescriptions as of 09/06/2015  Medication Sig  . dronabinol (MARINOL) 5 MG capsule Take 1 capsule (5 mg total) by mouth 2 (two) times daily before a meal. As needed (Patient taking differently: Take 5 mg by mouth 2 (two) times daily as needed (appetite). )  . hydrocortisone (CORTEF) 5 MG tablet Take 2 tablets in the morning and 1 tablet in the afternoon, no later than 6 PM  . meclizine (ANTIVERT) 32 MG tablet Take 1 tablet (32 mg total) by mouth 3 (three) times daily as needed for dizziness or nausea.  [name] oxyCODONE-acetaminophen (PERCOCET/ROXICET) 5-325 MG per tablet Take 1-2 tabs by mouth every 8-12 hours as needed for pain max 4 yabs a day  . polyethylene glycol powder (GLYCOLAX/MIRALAX) powder Take 17 g by mouth daily. Take daily or as needed to produce 1 normal bowel movement per day. (Patient taking differently: Take 17 g by mouth daily as needed for mild constipation. Take daily or as needed to produce 1 normal bowel movement per day.)  . Darunavir Ethanolate (PREZISTA) 800 MG tablet Take 1 tablet (800 mg total) by mouth daily. (Patient not taking: Reported on 09/06/2015)  . emtricitabine-tenofovir (TRUVADA) 200-300 MG per tablet Take 1 tablet by mouth daily. (Patient not taking: Reported on 09/06/2015)  . ferrous sulfate 325 (65 FE) MG tablet Take 1 tablet (325 mg total) by mouth 2 (two) times daily with a meal. (Patient  not taking: Reported on 09/06/2015)  . NORVIR 100 MG TABS tablet TAKE 1 TABLET BY MOUTH EVERY DAY (Patient not taking: Reported on 09/06/2015)  . ondansetron (ZOFRAN) 8 MG tablet Take 1 tablet (8 mg total) by mouth every 8 (eight) hours as needed for nausea or vomiting. (Patient not taking: Reported on 09/06/2015)  . [DISCONTINUED] hydrOXYzine (ATARAX/VISTARIL) 10 MG tablet Take 1 tablet (10 mg total) by mouth 3 (three) times daily as needed. (Patient taking differently: Take 10 mg by mouth 3 (three) times daily as needed for itching or anxiety. )  . [DISCONTINUED] meclizine (ANTIVERT) 32 MG tablet Take 1 tablet (32 mg total) by mouth 3 (three) times daily as needed. (Patient taking differently: Take 32 mg by mouth 3 (three) times daily as needed for dizziness. )  . [DISCONTINUED] naproxen (NAPROSYN) 500 MG tablet Take 1 tablet (500 mg total) by mouth 2 (two) times daily with a meal.  . [DISCONTINUED] ondansetron (ZOFRAN) 4 MG tablet Take 1 tablet (4 mg total) by mouth every 8 (eight) hours as needed for nausea or vomiting. (Patient not taking: Reported on 09/06/2015)  . [DISCONTINUED] Probiotic Product (PROBIOTIC DAILY PO) Take 2 tablets by mouth 2 (two) times daily. gummy  . [DISCONTINUED] promethazine (PHENERGAN) 25 MG tablet Take 1 tablet (25 mg total) by mouth every 6 (six) hours as needed for nausea or vomiting.   No facility-administered encounter medications on file as of  09/06/2015.     Patient Active Problem List   Diagnosis Date Noted  . Osteonecrosis of both hips 06/28/2015  . Nausea with vomiting 05/18/2015  . Overweight (BMI 25.0-29.9) 04/20/2015  . Seasonal allergies 03/20/2015  . Menorrhagia 03/19/2015  . Back pain of lumbar region with sciatica 02/12/2015  . Dizziness and giddiness 01/23/2015  . Primary adrenal insufficiency 01/03/2015  . Fatigue 09/25/2014  . Chest pain 07/07/2013  . Laceration of ankle, right 11/18/2012  . 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 Vaginal discharge 05/30/2012  .  Arthralgia 05/20/2012  . HIV (human immunodeficiency virus infection) 03/16/2012  . Dysphagia 03/11/2012  . 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 Iron deficiency anemia 03/11/2012  . Reflux esophagitis 03/11/2012     Health Maintenance Due  Topic Date Due  . INFLUENZA VACCINE  07/16/2015     Review of Systems + pertinents listed in hpi Physical Exam   BP 118/77 mmHg  Pulse 82  Temp(Src) 98.2 F (36.8 C) (Oral)  Ht 5\' 2"  (1.575 m)  Wt 152 lb (68.947 kg)  BMI 27.79 kg/m2  LMP 08/15/2015 Physical Exam  Constitutional:  oriented to person, place, and time. appears well-developed and well-nourished. No distress.  HENT: [place]/AT, PERRLA, no scleral icterus Mouth/Throat: Oropharynx is clear and moist. No oropharyngeal exudate.  Cardiovascular: Normal rate, regular rhythm and normal heart sounds. Exam reveals no gallop and no friction rub.  No murmur heard.  Pulmonary/Chest: Effort normal and breath sounds normal. No respiratory distress.  has no wheezes.  Neck = supple, no nuchal rigidity Abdominal: Soft. Bowel sounds are normal.  exhibits no distension. There is no tenderness.  Lymphadenopathy: no cervical adenopathy. No axillary adenopathy Neurological: alert and oriented to person, place, and time.  Skin: Skin is warm and dry. No rash noted. No erythema.  Psychiatric: a normal mood and affect.  behavior is normal.    Lab Results  Component Value Date   CD4TCELL 23* 03/21/2015   Lab Results  Component Value Date   CD4TABS 460 03/21/2015   CD4TABS 650 01/23/2015   CD4TABS 610 10/24/2014   Lab Results  Component Value Date   HIV1RNAQUANT <20 01/23/2015   Lab Results  Component Value Date   HEPBSAB NEGATIVE 03/13/2012   No results found for: RPR  CBC Lab Results  Component Value Date   WBC 7.5 08/06/2015   RBC 3.93 08/06/2015   HGB 12.4 08/06/2015   HCT 35.9* 08/06/2015   PLT 247 08/06/2015   MCV 91.3 08/06/2015   MCH 31.6 08/06/2015   MCHC  34.5 08/06/2015   RDW 14.8 08/06/2015   LYMPHSABS 2.4 06/01/2015   MONOABS 0.5 06/01/2015   EOSABS 0.1 06/01/2015   BASOSABS 0.0 06/01/2015   BMET Lab Results  Component Value Date   NA 138 08/06/2015   K 3.1* 08/06/2015   CL 107 08/06/2015   CO2 19* 08/06/2015   GLUCOSE 146* 08/06/2015   BUN 5* 08/06/2015   CREATININE 0.82 08/06/2015   CALCIUM 9.3 08/06/2015   GFRNONAA >60 08/06/2015   GFRAA >60 08/06/2015     Assessment and Plan  hiv disease = will get labs today. Reinforced adherence since she is missing too many doses in 30 days. 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 Will give letter for professors to  help make accomodations. PCP considering referral to orthopedics

## 2015-09-07 DIAGNOSIS — Z23 Encounter for immunization: Secondary | ICD-10-CM

## 2015-09-13 ENCOUNTER — Other Ambulatory Visit (INDEPENDENT_AMBULATORY_CARE_PROVIDER_SITE_OTHER): Payer: Self-pay

## 2015-09-13 DIAGNOSIS — R635 Abnormal weight gain: Secondary | ICD-10-CM

## 2015-09-13 DIAGNOSIS — B20 Human immunodeficiency virus [HIV] disease: Secondary | ICD-10-CM

## 2015-09-13 LAB — COMPLETE METABOLIC PANEL WITH GFR
ALT: 12 U/L (ref 6–29)
AST: 15 U/L (ref 10–30)
Albumin: 4.3 g/dL (ref 3.6–5.1)
Alkaline Phosphatase: 109 U/L (ref 33–115)
BUN: 6 mg/dL — ABNORMAL LOW (ref 7–25)
CO2: 22 mmol/L (ref 20–31)
Calcium: 9.7 mg/dL (ref 8.6–10.2)
Chloride: 106 mmol/L (ref 98–110)
Creat: 0.73 mg/dL (ref 0.50–1.10)
GFR, Est African American: >89 mL/min (ref >=60)
GFR, Est Non African American: >89 mL/min (ref >=60)
Glucose, Bld: 79 mg/dL (ref 65–99)
Potassium: 4.4 mmol/L (ref 3.5–5.3)
Sodium: 138 mmol/L (ref 135–146)
Total Bilirubin: 0.4 mg/dL (ref 0.2–1.2)
Total Protein: 7.5 g/dL (ref 6.1–8.1)

## 2015-09-14 ENCOUNTER — Encounter (HOSPITAL_COMMUNITY): Payer: Self-pay | Admitting: General Practice

## 2015-09-14 ENCOUNTER — Emergency Department (HOSPITAL_COMMUNITY)
Admission: EM | Admit: 2015-09-14 | Discharge: 2015-09-14 | Disposition: A | Discharge status: Home / Self Care | Payer: Self-pay | Attending: Emergency Medicine | Admitting: Emergency Medicine

## 2015-09-14 DIAGNOSIS — Z79899 Other long term (current) drug therapy: Secondary | ICD-10-CM | POA: Insufficient documentation

## 2015-09-14 DIAGNOSIS — Z872 Personal history of diseases of the skin and subcutaneous tissue: Secondary | ICD-10-CM | POA: Insufficient documentation

## 2015-09-14 DIAGNOSIS — R112 Nausea with vomiting, unspecified: Secondary | ICD-10-CM | POA: Insufficient documentation

## 2015-09-14 DIAGNOSIS — R1013 Epigastric pain: Secondary | ICD-10-CM | POA: Insufficient documentation

## 2015-09-14 DIAGNOSIS — Z8719 Personal history of other diseases of the digestive system: Secondary | ICD-10-CM | POA: Insufficient documentation

## 2015-09-14 DIAGNOSIS — B20 Human immunodeficiency virus [HIV] disease: Secondary | ICD-10-CM | POA: Insufficient documentation

## 2015-09-14 DIAGNOSIS — Z8619 Personal history of other infectious and parasitic diseases: Secondary | ICD-10-CM | POA: Insufficient documentation

## 2015-09-14 DIAGNOSIS — R1084 Generalized abdominal pain: Secondary | ICD-10-CM | POA: Insufficient documentation

## 2015-09-14 DIAGNOSIS — Z3202 Encounter for pregnancy test, result negative: Secondary | ICD-10-CM | POA: Insufficient documentation

## 2015-09-14 DIAGNOSIS — Z87828 Personal history of other (healed) physical injury and trauma: Secondary | ICD-10-CM | POA: Insufficient documentation

## 2015-09-14 DIAGNOSIS — Z8611 Personal history of tuberculosis: Secondary | ICD-10-CM | POA: Insufficient documentation

## 2015-09-14 DIAGNOSIS — D649 Anemia, unspecified: Secondary | ICD-10-CM | POA: Insufficient documentation

## 2015-09-14 DIAGNOSIS — G8929 Other chronic pain: Secondary | ICD-10-CM | POA: Insufficient documentation

## 2015-09-14 LAB — URINALYSIS, ROUTINE W REFLEX MICROSCOPIC
Bilirubin Urine: NEGATIVE
Glucose, UA: NEGATIVE mg/dL
Ketones, ur: NEGATIVE mg/dL
Leukocytes, UA: NEGATIVE
Nitrite: NEGATIVE
Protein, ur: NEGATIVE mg/dL
Specific Gravity, Urine: 1.021 (ref 1.005–1.030)
Urobilinogen, UA: 0.2 mg/dL (ref 0.0–1.0)
pH: 7 (ref 5.0–8.0)

## 2015-09-14 LAB — CBC WITH DIFFERENTIAL/PLATELET
Basophils Absolute: 0 10*3/uL (ref 0.0–0.1)
Basophils Relative: 0 %
Eosinophils Absolute: 0.1 10*3/uL (ref 0.0–0.7)
Eosinophils Relative: 2 %
HCT: 36.8 % (ref 36.0–46.0)
Hemoglobin: 12.4 g/dL (ref 12.0–15.0)
Lymphocytes Relative: 30 %
Lymphs Abs: 1.4 10*3/uL (ref 0.7–4.0)
MCH: 31.4 pg (ref 26.0–34.0)
MCHC: 33.7 g/dL (ref 30.0–36.0)
MCV: 93.2 fL (ref 78.0–100.0)
Monocytes Absolute: 0.5 10*3/uL (ref 0.1–1.0)
Monocytes Relative: 11 %
Neutro Abs: 2.7 10*3/uL (ref 1.7–7.7)
Neutrophils Relative %: 57 %
Platelets: 255 10*3/uL (ref 150–400)
RBC: 3.95 MIL/uL (ref 3.87–5.11)
RDW: 14.6 % (ref 11.5–15.5)
WBC: 4.7 10*3/uL (ref 4.0–10.5)

## 2015-09-14 LAB — I-STAT BETA HCG BLOOD, ED (MC, WL, AP ONLY): I-stat hCG, quantitative: <5 m[IU]/mL (ref <5)

## 2015-09-14 LAB — HIV-1 RNA QUANT-NO REFLEX-BLD
HIV 1 RNA Quant: <20 copies/mL (ref <20)
HIV-1 RNA Quant, Log: <1.3 {Log} (ref <1.30)

## 2015-09-14 LAB — URINE MICROSCOPIC-ADD ON

## 2015-09-14 LAB — COMPREHENSIVE METABOLIC PANEL
ALT: 17 U/L (ref 14–54)
AST: 25 U/L (ref 15–41)
Albumin: 4.1 g/dL (ref 3.5–5.0)
Alkaline Phosphatase: 105 U/L (ref 38–126)
Anion gap: 10 (ref 5–15)
BUN: <5 mg/dL — ABNORMAL LOW (ref 6–20)
CO2: 23 mmol/L (ref 22–32)
Calcium: 9.4 mg/dL (ref 8.9–10.3)
Chloride: 108 mmol/L (ref 101–111)
Creatinine, Ser: 0.79 mg/dL (ref 0.44–1.00)
GFR calc Af Amer: >60 mL/min (ref >60)
GFR calc non Af Amer: >60 mL/min (ref >60)
Glucose, Bld: 99 mg/dL (ref 65–99)
Potassium: 3.6 mmol/L (ref 3.5–5.1)
Sodium: 141 mmol/L (ref 135–145)
Total Bilirubin: 0.4 mg/dL (ref 0.3–1.2)
Total Protein: 7.3 g/dL (ref 6.5–8.1)

## 2015-09-14 LAB — T-HELPER CELL (CD4) - (RCID CLINIC ONLY)
CD4 % Helper T Cell: 25 % — ABNORMAL LOW (ref 33–55)
CD4 T Cell Abs: 620 /uL (ref 400–2700)

## 2015-09-14 LAB — PROTEIN, URINE, RANDOM: Total Protein, Urine: 21 mg/dL (ref 5–24)

## 2015-09-14 LAB — LIPASE, BLOOD: Lipase: 19 U/L — ABNORMAL LOW (ref 22–51)

## 2015-09-14 LAB — BRAIN NATRIURETIC PEPTIDE: Brain Natriuretic Peptide: 14.9 pg/mL (ref 0.0–100.0)

## 2015-09-14 LAB — MICROALBUMIN / CREATININE URINE RATIO
Creatinine, Urine: 196.5 mg/dL
Microalb Creat Ratio: 10.7 mg/g (ref 0.0–30.0)
Microalb, Ur: 2.1 mg/dL — ABNORMAL HIGH (ref <2.0)

## 2015-09-14 LAB — HEPATITIS B SURFACE ANTIBODY,QUALITATIVE: Hep B S Ab: POSITIVE — AB

## 2015-09-14 MED ORDER — SODIUM CHLORIDE 0.9 % IV BOLUS (SEPSIS)
1000.0000 mL | Freq: Once | INTRAVENOUS | Status: AC
  Administered 2015-09-14: 1000 mL via INTRAVENOUS

## 2015-09-14 MED ORDER — ONDANSETRON HCL 4 MG/2ML IJ SOLN
4.0000 mg | Freq: Once | INTRAMUSCULAR | Status: AC
  Administered 2015-09-14: 4 mg via INTRAVENOUS
  Filled 2015-09-14: qty 2

## 2015-09-14 MED ORDER — PROMETHAZINE HCL 25 MG PO TABS: 25.0000 mg | ORAL_TABLET | Freq: Four times a day (QID) | ORAL | Status: DC | PRN

## 2015-09-14 NOTE — ED Notes (Signed)
Pt presents with abdominal pain that started last night around 2200. Pt reports N/V and denies diarrhea. Pt reports also feeling weak. Pt reports vomiting 5 times.

## 2015-09-14 NOTE — ED Provider Notes (Signed)
CSN: [id number]     Arrival date & time 09/14/15  8177 History   First MD Initiated Contact with Patient 09/14/15 [phone]     Chief Complaint  Patient presents with  . Abdominal Pain     (Consider location/radiation/quality/duration/timing/severity/associated sxs/prior Treatment) HPI Comments: Patient with a history of HIV presents today with a chief complaint of nausea and vomiting.  She reports onset of symptoms last evening.  She states that she has had five episodes of vomiting.  No blood in her emesis.  She denies any associated diarrhea.  She does report associated diffuse abdominal pain that is worse with vomiting.  No fever or chills.  She denies urinary symptoms.  Denies vaginal discharge.  She states that she took Zofran at home for her symptoms yesterday, but did not think that it helped.  No known sick contacts. Her most recent CD 4 count was 460 and VL<20 (feb 2016).  Patient is a 24 y.o. female presenting with abdominal pain. The history is provided by the patient.  Abdominal Pain   Past Medical History  Diagnosis Date  . Tuberculosis   . HIV (human immunodeficiency virus infection) 02/2012  . Pelvic pain   . 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 Bell's palsy 08/26/2013  . Anemia of chronic disease 03/11/2012  . 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 Laceration of ankle, right 11/18/2012  . Reflux esophagitis 03/11/2012  . 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 Chronic headache   . Vertigo   . Chronic back pain   . Lumbar radiculopathy   . Chronic leg pain     bilateral knees, ankles  . Fatigue    Past Surgical History  Procedure Laterality Date  . Esophagogastroduodenoscopy  03/11/2012    Procedure: ESOPHAGOGASTRODUODENOSCOPY (EGD);  Surgeon: Lafayette Dragon, MD;  Location: [facility] ENDOSCOPY;  Service: Endoscopy;  Laterality: N/A;  . Appendectomy  ~ 2000  . Lung biopsy  02/2012  . Dilation and curettage of uterus  2008  .  Esophagogastroduodenoscopy N/A 03/07/2014    Procedure: ESOPHAGOGASTRODUODENOSCOPY (EGD);  Surgeon: Gatha Mayer, MD;  Location: [facility] ENDOSCOPY;  Service: Endoscopy;  Laterality: N/A;   Family History  Problem Relation Age of Onset  . Heart disease Father     Vague not clearly cardiac   Social History  Substance Use Topics  . Smoking status: Never Smoker   . Smokeless tobacco: Never Used  . Alcohol Use: No     Comment: socially   OB History    Gravida Para Term Preterm AB TAB SAB Ectopic Multiple Living   1 0 0 0 1 0 1 0 0 0      Review of Systems  Gastrointestinal: Positive for abdominal pain.  All other systems reviewed and are negative.     Allergies  Hydrocodone and Tramadol  Home Medications   Prior to Admission medications   Medication Sig Start Date End Date Taking? Authorizing Provider  Darunavir Ethanolate (PREZISTA) 800 MG tablet Take 1 tablet (800 mg total) by mouth daily. Patient not taking: Reported on 09/06/2015 07/26/15   Carlyle Basques, MD  dronabinol (MARINOL) 5 MG capsule Take 1 capsule (5 mg total) by mouth 2 (two) times daily before a meal. As needed Patient taking differently: Take 5 mg by mouth 2 (two) times daily as needed (appetite).  12/20/14   Carlyle Basques, MD  emtricitabine-tenofovir (TRUVADA) 200-300 MG per tablet Take 1 tablet by mouth daily. Patient not taking: Reported on 09/06/2015  07/26/15   Carlyle Basques, MD  ferrous sulfate 325 (65 FE) MG tablet Take 1 tablet (325 mg total) by mouth 2 (two) times daily with a meal. Patient not taking: Reported on 09/06/2015 04/23/15   Patrecia Pour, MD  hydrocortisone (CORTEF) 5 MG tablet Take 2 tablets in the morning and 1 tablet in the afternoon, no later than 6 PM 06/28/15   Philemon Kingdom, MD  meclizine (ANTIVERT) 32 MG tablet Take 1 tablet (32 mg total) by mouth 3 (three) times daily as needed for dizziness or nausea. 08/06/15   Mercedes Camprubi-Soms, PA-C  NORVIR 100 MG TABS tablet TAKE 1 TABLET BY MOUTH  EVERY DAY Patient not taking: Reported on 09/06/2015 07/23/15   Carlyle Basques, MD  ondansetron (ZOFRAN ODT) 8 MG disintegrating tablet Take 1 tablet (8 mg total) by mouth every 8 (eight) hours as needed for nausea or vomiting. 09/06/15   Carlyle Basques, MD  ondansetron (ZOFRAN) 8 MG tablet Take 1 tablet (8 mg total) by mouth every 8 (eight) hours as needed for nausea or vomiting. Patient not taking: Reported on 09/06/2015 08/06/15   Mercedes Camprubi-Soms, PA-C  oxyCODONE-acetaminophen (PERCOCET/ROXICET) 5-325 MG per tablet Take 1-2 tabs by mouth every 8-12 hours as needed for pain max 4 yabs a day 08/22/15   Dickie La, MD  polyethylene glycol powder (GLYCOLAX/MIRALAX) powder Take 17 g by mouth daily. Take daily or as needed to produce 1 normal bowel movement per day. Patient taking differently: Take 17 g by mouth daily as needed for mild constipation. Take daily or as needed to produce 1 normal bowel movement per day. 04/23/15   Patrecia Pour, MD   BP 127/90 mmHg  Pulse 97  Temp(Src) 98.3 F (36.8 C) (Oral)  Resp 18  Ht 5\' 2"  (1.575 m)  Wt 153 lb (69.4 kg)  BMI 27.98 kg/m2  SpO2 100%  LMP 09/14/2015 Physical Exam  Constitutional: She appears well-developed and well-nourished.  HENT:  Head: Normocephalic and atraumatic.  Mouth/Throat: Oropharynx is clear and moist.  Neck: Normal range of motion. Neck supple.  Cardiovascular: Normal rate, regular rhythm and normal heart sounds.   Pulmonary/Chest: Effort normal and breath sounds normal.  Abdominal: Soft. Bowel sounds are normal. She exhibits no distension and no mass. There is tenderness. There is no rebound, no guarding, no tenderness at McBurney's point and negative Murphy's sign.  Mild diffuse tenderness to palpation, worse in the epigatrium  Musculoskeletal: Normal range of motion.  Neurological: She is alert.  Skin: Skin is warm and dry.  Psychiatric: She has a normal mood and affect.  Nursing note and vitals reviewed.   ED Course   Procedures (including critical care time) Labs Review Labs Reviewed  CBC WITH DIFFERENTIAL/PLATELET  COMPREHENSIVE METABOLIC PANEL  LIPASE, BLOOD  URINALYSIS, ROUTINE W REFLEX MICROSCOPIC (NOT AT [facility])  POC URINE PREG, ED    Imaging Review No results found. I have personally reviewed and evaluated these images and lab results as part of my medical decision-making.   EKG Interpretation None      MDM   Final diagnoses:  None   Patient with a history of HIV presents today with nausea and vomiting onset last evening.  She also reports mild diffuse abdominal pain, worse in the epigastrium.  Abdominal pain worse with vomiting.  Patient afebrile.  Labs today unremarkable.  On exam, she has mild diffuse abdominal tenderness, worse in the epigastrium.  No rebound or guarding.  Therefore, doubt surgical abdomen.  Nausea improved  in the ED after IV Zofran.  Patient tolerating PO liquids.  Feel that she is stable for discharge.  Strict return precautions given.  Patient given Rx for Phenergan since she felt that the PO Zofran was not helping the nausea at home.      Hyman Bible, PA-C 09/16/15 1138  Fredia Sorrow, MD 09/17/15 1523

## 2015-09-21 ENCOUNTER — Ambulatory Visit (INDEPENDENT_AMBULATORY_CARE_PROVIDER_SITE_OTHER): Payer: Self-pay | Admitting: Family Medicine

## 2015-09-21 ENCOUNTER — Encounter: Payer: Self-pay | Admitting: Family Medicine

## 2015-09-21 VITALS — BP 117/79 | HR 85 | Temp 98.3°F | Ht 62.0 in | Wt 151.5 lb

## 2015-09-21 DIAGNOSIS — M879 Osteonecrosis, unspecified: Secondary | ICD-10-CM

## 2015-09-21 MED ORDER — OXYCODONE-ACETAMINOPHEN 5-325 MG PO TABS
1.0000 | ORAL_TABLET | Freq: Four times a day (QID) | ORAL | Status: DC | PRN
Start: 2015-09-21 — End: 2015-10-12

## 2015-09-21 NOTE — Progress Notes (Signed)
Subjective: Kelsey Rodriguez is a 24 y.o. female with a history of adrenal insufficiency on steroids chronically and HIV on ART returning for pain related with bilateral hip osteonecrosis confirmed on MRI.   She has been limiting her weight bearing as tolerated. It has caused her to drop most of her classes in college and limits which she can take at this time. She has severe, constant, waxing/waning pain in her hips (L > R) radiating into the groin worse with walking but also present at night and with sitting. Improved to a tolerable level with percocet which mostly works by making her fall asleep. She is also taking NSAIDs as needed. The right hip has gotten better but the left hip is nearly the same, though making up for the left sided pain seems to tax the right leg more.  - ROS: No fevers, trauma.  - Non-smoker  Objective: BP 117/79 mmHg  Pulse 85  Temp(Src) 98.3 F (36.8 C) (Oral)  Ht 5\' 2"  (1.575 m)  Wt 151 lb 8 oz (68.72 kg)  BMI 27.70 kg/m2  LMP 09/14/2015 Gen: Pleasant, well-appearing  24 y.o. female in no distress CV: Regular rate, no murmur; radial, DP and PT pulses 2+ bilaterally; no LE edema, no JVD, cap refill < 2 sec. MSK: Antalgic gait favoring L > R leg; no digital clubbing/cyanosis, no frank joint deformity/effusion, full active ROM, no paraspinal spasm, no midline tenderness in spine  Assessment/Plan: Kelsey Rodriguez is a 24 y.o. female here for pain management for AVN.  Osteonecrosis of both hips (HCC) Complication of chronic treatments for adrenal insufficiency and HIV. Pain treated with limited weight bearing and opioid analgesics. Orthopedic referral has been placed, though she appears to be stable without requiring surgery at this time. We have discussed that THA is not a good intervention at her age. Continue analgesic medications, though these are NOT chronic narcotics.

## 2015-09-21 NOTE — Assessment & Plan Note (Signed)
Complication of chronic treatments for adrenal insufficiency and HIV. Pain treated with limited weight bearing and opioid analgesics. Orthopedic referral has been placed, though she appears to be stable without requiring surgery at this time. We have discussed that THA is not a good intervention at her age. Continue analgesic medications, though these are NOT chronic narcotics.

## 2015-09-21 NOTE — Patient Instructions (Signed)
Keep working on staying off the legs as much as possible. Use crutches if you absolutely need to be up. I have written you a letter to see if your school can accommodate you with online classes.   Take the pain medications as needed.   I will see you in 1 month or earlier if anything comes up  Our clinic's number is [phone]. Feel free to call any time with questions or concerns. We will answer any questions after hours with our 24-hour emergency line at that number as well.   - Dr. Bonner Puna

## 2015-10-11 ENCOUNTER — Encounter: Payer: Self-pay | Admitting: Family Medicine

## 2015-10-11 DIAGNOSIS — M879 Osteonecrosis, unspecified: Secondary | ICD-10-CM

## 2015-10-12 MED ORDER — OXYCODONE-ACETAMINOPHEN 10-325 MG PO TABS: 1.0000 | ORAL_TABLET | Freq: Three times a day (TID) | ORAL | Status: DC | PRN

## 2015-10-15 ENCOUNTER — Ambulatory Visit (INDEPENDENT_AMBULATORY_CARE_PROVIDER_SITE_OTHER): Payer: Self-pay | Admitting: Internal Medicine

## 2015-10-15 ENCOUNTER — Encounter: Payer: Self-pay | Admitting: Internal Medicine

## 2015-10-15 VITALS — BP 143/78 | HR 88 | Temp 98.3°F | Wt 153.0 lb

## 2015-10-15 DIAGNOSIS — B20 Human immunodeficiency virus [HIV] disease: Secondary | ICD-10-CM

## 2015-10-15 DIAGNOSIS — Z0000 Encounter for general adult medical examination without abnormal findings: Secondary | ICD-10-CM

## 2015-10-15 DIAGNOSIS — M8700 Idiopathic aseptic necrosis of unspecified bone: Secondary | ICD-10-CM

## 2015-10-15 MED ORDER — EMTRICITABINE-TENOFOVIR AF 200-25 MG PO TABS: 1.0000 | ORAL_TABLET | Freq: Every day | ORAL | Status: DC

## 2015-10-15 MED ORDER — DARUNAVIR-COBICISTAT 800-150 MG PO TABS: 1.0000 | ORAL_TABLET | Freq: Every day | ORAL | Status: DC

## 2015-10-15 NOTE — Progress Notes (Signed)
Patient ID: Kelsey Rodriguez, female   DOB: 19-Feb-1991, 24 y.o.   MRN: [id number]       Patient ID: Kelsey Rodriguez, female   DOB: 07/23/1991, 24 y.o.   MRN: [id number]  HPI Kelsey Rodriguez is a 24yo F with HIV disease, Cd 4 count of 620/VL<20 on DRVr/truvada, hx of pulm tb, tx. Adrenal insufficiency, bilateral hip AVN, GERD. Has still having significant hip pain L> R especially with ambulation. She has decreased going to class and switched to online classes in order to minimize walking. She states pain meds help but does not want to take them daily.   Outpatient Encounter Prescriptions as of 10/15/2015  Medication Sig  . Darunavir Ethanolate (PREZISTA) 800 MG tablet Take 1 tablet (800 mg total) by mouth daily.  [name] emtricitabine-tenofovir (TRUVADA) 200-300 MG per tablet Take 1 tablet by mouth daily.  . ferrous sulfate 325 (65 FE) MG tablet Take 1 tablet (325 mg total) by mouth 2 (two) times daily with a meal.  . hydrocortisone (CORTEF) 5 MG tablet Take 2 tablets in the morning and 1 tablet in the afternoon, no later than 6 PM  . meclizine (ANTIVERT) 32 MG tablet Take 1 tablet (32 mg total) by mouth 3 (three) times daily as needed for dizziness or nausea.  . NORVIR 100 MG TABS tablet TAKE 1 TABLET BY MOUTH EVERY DAY  . ondansetron (ZOFRAN ODT) 8 MG disintegrating tablet Take 1 tablet (8 mg total) by mouth every 8 (eight) hours as needed for nausea or vomiting.  [name] oxyCODONE-acetaminophen (PERCOCET) 10-325 MG tablet Take 1 tablet by mouth every 8 (eight) hours as needed for pain.  . promethazine (PHENERGAN) 25 MG tablet Take 1 tablet (25 mg total) by mouth every 6 (six) hours as needed for nausea.  . [DISCONTINUED] dronabinol (MARINOL) 5 MG capsule Take 1 capsule (5 mg total) by mouth 2 (two) times daily before a meal. As needed (Patient taking differently: Take 5 mg by mouth 2 (two) times daily as needed (appetite). )  . polyethylene glycol powder (GLYCOLAX/MIRALAX) powder Take 17 g by mouth daily. Take daily or as  needed to produce 1 normal bowel movement per day. (Patient not taking: Reported on 10/15/2015)   No facility-administered encounter medications on file as of 10/15/2015.     Patient Active Problem List   Diagnosis Date Noted  . Osteonecrosis of both hips ([place]) 06/28/2015  . Nausea with vomiting 05/18/2015  . Overweight (BMI 25.0-29.9) 04/20/2015  . Seasonal allergies 03/20/2015  . Menorrhagia 03/19/2015  . Back pain of lumbar region with sciatica 02/12/2015  . Dizziness and giddiness 01/23/2015  . Primary adrenal insufficiency ([place]) 01/03/2015  . Fatigue 09/25/2014  . Chest pain 07/07/2013  . Laceration of ankle, right 11/18/2012  . 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 Vaginal discharge 05/30/2012  . Arthralgia 05/20/2012  . HIV (human immunodeficiency virus infection) ([place]) 03/16/2012  . Dysphagia 03/11/2012  . 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 Iron deficiency anemia 03/11/2012  . Reflux esophagitis 03/11/2012     There are no preventive care reminders to display for this patient.   Review of Systems 10 point ros reviewed, positive pertinents listed in hpi Physical Exam   BP 143/78 mmHg  Pulse 88  Temp(Src) 98.3 F (36.8 C) (Oral)  Wt 153 lb (69.4 kg)  LMP 09/10/2015 Physical Exam  Constitutional:  oriented to person, place, and time. appears well-developed and well-nourished. No distress.  HENT: [place]/AT, PERRLA, no scleral icterus Mouth/Throat: Oropharynx is clear and moist. No  oropharyngeal exudate.  Cardiovascular: Normal rate, regular rhythm and normal heart sounds. Exam reveals no gallop and no friction rub.  No murmur heard.  Pulmonary/Chest: Effort normal and breath sounds normal. No respiratory distress.  has no wheezes.  Neck = supple, no nuchal rigidity Lymphadenopathy: no cervical adenopathy. No axillary adenopathy Neurological: alert and oriented to person, place, and time.  Skin: Skin is warm and dry. No rash noted. No erythema.  Psychiatric: a  normal mood and affect.  behavior is normal.    Lab Results  Component Value Date   CD4TCELL 25* 09/13/2015   Lab Results  Component Value Date   CD4TABS 620 09/13/2015   CD4TABS 460 03/21/2015   CD4TABS 650 01/23/2015   Lab Results  Component Value Date   HIV1RNAQUANT <20 09/13/2015   Lab Results  Component Value Date   HEPBSAB POS* 09/13/2015   No results found for: RPR  CBC Lab Results  Component Value Date   WBC 4.7 09/14/2015   RBC 3.95 09/14/2015   HGB 12.4 09/14/2015   HCT 36.8 09/14/2015   PLT 255 09/14/2015   MCV 93.2 09/14/2015   MCH 31.4 09/14/2015   MCHC 33.7 09/14/2015   RDW 14.6 09/14/2015   LYMPHSABS 1.4 09/14/2015   MONOABS 0.5 09/14/2015   EOSABS 0.1 09/14/2015   BASOSABS 0.0 09/14/2015   BMET Lab Results  Component Value Date   NA 141 09/14/2015   K 3.6 09/14/2015   CL 108 09/14/2015   CO2 23 09/14/2015   GLUCOSE 99 09/14/2015   BUN <5* 09/14/2015   CREATININE 0.79 09/14/2015   CALCIUM 9.4 09/14/2015   GFRNONAA >60 09/14/2015   GFRAA >60 09/14/2015     Assessment and Plan HIV disease - will change to prezcobix-descovy to reduce pill burden  Avascular necrosis of hip, bilaterally - seeing sports medicine tomorrow, taking prn pain meds as needed  Health maintenance - her menses is due next week = then come back in 2 wk for depo shot. Gave condoms

## 2015-10-16 ENCOUNTER — Ambulatory Visit: Payer: Self-pay | Admitting: Sports Medicine

## 2015-10-29 ENCOUNTER — Ambulatory Visit: Payer: Self-pay

## 2015-10-31 ENCOUNTER — Ambulatory Visit (INDEPENDENT_AMBULATORY_CARE_PROVIDER_SITE_OTHER): Payer: Medicaid Other | Admitting: Sports Medicine

## 2015-10-31 ENCOUNTER — Encounter: Payer: Self-pay | Admitting: Sports Medicine

## 2015-10-31 VITALS — BP 120/80 | Ht 64.0 in | Wt 153.0 lb

## 2015-10-31 DIAGNOSIS — M25552 Pain in left hip: Secondary | ICD-10-CM | POA: Diagnosis not present

## 2015-10-31 DIAGNOSIS — M8700 Idiopathic aseptic necrosis of unspecified bone: Secondary | ICD-10-CM | POA: Diagnosis not present

## 2015-10-31 DIAGNOSIS — M25551 Pain in right hip: Secondary | ICD-10-CM | POA: Diagnosis not present

## 2015-10-31 NOTE — Patient Instructions (Signed)
[facility] Jean Rosenthal, MD Tuesday, November 29th at 10:45am [place], [place], [place] [address] Phone: [phone]

## 2015-10-31 NOTE — Progress Notes (Signed)
Subjective:    Patient ID: Kelsey Rodriguez, female    DOB: 16-Sep-1991, 24 y.o.   MRN: [id number]  HPI 24 year old female with multiple medical problems detailed below presented for evaluation of bilateral hip pain after an x-ray and subsequent MRI performed by her primary care physician revealed avascular necrosis of the bilateral femoral heads, suspected to be secondary to chronic corticosteroid use for primary adrenal insufficiency.  She is followed by [facility] for her adrenal insufficiency and followed by [facility] for HIV.  She reports that her hip pain first started approximately 18 months ago. Progressively worsening since that time. Approximately one year ago it became bilateral but always left worse than right. She had to stop working in April 2016 secondary to pain, previously worked as a Automotive engineer.  Her hip pain has become so severe as impacting her quality of life.  She currently is using oxycodone with limited benefit for pain relief. Previously no response to tramadol and hydrocodone.   She has most of her pain with prolonged standing, walking short distances and transferring from a seated to standing position. Her pain is so severe at night and severely impacts her sleep and she has not slept well since April or May.  She is unable to bend over and pick up any objects that she drops on the floor.   PMHx -- reviewed and updated in EMR; relevant portions impacting decision making: Avascular necrosis of bilateral femoral heads secondary to chronic steroid use for primary adrenal insufficiency. HIV on triple antiviral therapy.  PSHx -- reviewed and updated in EMR; relevant portions impacting decision making: No surgical history of examined area  Meds -- reviewed and updated in EMR; relevant portions impacting decision making: She is on retroviral therapy for HIV and she takes 15 mg total daily dose (10 mg in morning, 5 mg in afternoon)  of hydrocortisone daily for primary adrenal insufficiency  Allergies, Social Hx -- reviewed and updated in EMR  Review of Systems 10 point review of systems negative other than the above.    Objective:   Physical Exam  General: Resting comfortably on exam table and in no acute distress  Neurologic: Distal sensory and motor exam in bilateral lower extremities is unremarkable  Cardiopulmonary: 2+ pulses in bilateral distal lower extremity; nonlabored respiration  Psychiatric exam: Normal mood, congruent affect. Appropriate thought content   Bilateral hip exam: There are no abnormalities on inspection. Palpation/pelvic rocking at the ASIS is unremarkable and nonpainful. She is nontender to palpation on the pubic symphysis and greater trochanter bilaterally.  Active range of motion in bilateral hip flexion is limited to 30, limited to 5 in extension, limited to 10 in internal rotation and approximately 25 in external rotation. All of the above active limitations are secondary to pain/weakness. Her passive range of motion was the same for above secondary to pain.  She has 4/5 strength bilaterally but slightly worse on the LEFT with hip flexion, hip extension, hip abduction, hip internal rotation. 5/5 strength with hip external rotation and adduction bilaterally.  There is a positive logroll bilaterally. Other special testing of the hip was deferred secondary to the patient's known pathology.  Gait Analysis: With walking, there is significant antalgic gait. Left worse than right. There is a Trendelenburg sign present on the left. Patient has remarkable degree of genu valgum bilaterally with walking, the medial aspects of thighs and proximal leg rubbing against each other. She is only able to  ambulate to/from the length of the hallway without having to stop and rest secondary to pain. She has reduced hip flexion and knee lift with ambulation. With ambulation she externally rotates her bilateral  legs, starting at the hip joint.   MRI of bilateral hips from 07/16/2015, reviewed Extensive AVN of both hips with flattening of the left femoral head.     Assessment & Plan:  Bilateral hip pain, left greater than right, secondary to AVN  This is a very difficult case. Patient has severe AVN of both hips, left greater than right. She was fully functional and working up until earlier this year. Now she has pain severe enough that she is unable to work and it is severely affecting her ADLs. I would like to refer her to Dr. Ninfa Linden for his input. Unfortunately I think she will be faced with needing total hip arthroplasties. I do not think she will get any prolonged symptom relief with intra-articular cortisone injections. Although she is very young and has medical comorbidities, her degree of disability and pain at least warrant a discussion with an orthopedist to discuss the pros and cons of total hip replacement. Follow-up with me when necessary.  ===

## 2015-11-01 ENCOUNTER — Encounter: Payer: Self-pay | Admitting: Sports Medicine

## 2015-11-02 ENCOUNTER — Emergency Department (HOSPITAL_COMMUNITY)
Admission: EM | Admit: 2015-11-02 | Discharge: 2015-11-02 | Disposition: A | Discharge status: Home / Self Care | Payer: Self-pay | Attending: Emergency Medicine | Admitting: Emergency Medicine

## 2015-11-02 ENCOUNTER — Emergency Department (HOSPITAL_COMMUNITY): Payer: Self-pay

## 2015-11-02 ENCOUNTER — Encounter (HOSPITAL_COMMUNITY): Payer: Self-pay | Admitting: Emergency Medicine

## 2015-11-02 DIAGNOSIS — G8929 Other chronic pain: Secondary | ICD-10-CM | POA: Insufficient documentation

## 2015-11-02 DIAGNOSIS — S199XXA Unspecified injury of neck, initial encounter: Secondary | ICD-10-CM | POA: Insufficient documentation

## 2015-11-02 DIAGNOSIS — D638 Anemia in other chronic diseases classified elsewhere: Secondary | ICD-10-CM | POA: Insufficient documentation

## 2015-11-02 DIAGNOSIS — Z8719 Personal history of other diseases of the digestive system: Secondary | ICD-10-CM | POA: Insufficient documentation

## 2015-11-02 DIAGNOSIS — S4992XA Unspecified injury of left shoulder and upper arm, initial encounter: Secondary | ICD-10-CM | POA: Insufficient documentation

## 2015-11-02 DIAGNOSIS — Z8669 Personal history of other diseases of the nervous system and sense organs: Secondary | ICD-10-CM | POA: Insufficient documentation

## 2015-11-02 DIAGNOSIS — S299XXA Unspecified injury of thorax, initial encounter: Secondary | ICD-10-CM | POA: Insufficient documentation

## 2015-11-02 DIAGNOSIS — Z872 Personal history of diseases of the skin and subcutaneous tissue: Secondary | ICD-10-CM | POA: Insufficient documentation

## 2015-11-02 DIAGNOSIS — Y92410 Unspecified street and highway as the place of occurrence of the external cause: Secondary | ICD-10-CM | POA: Insufficient documentation

## 2015-11-02 DIAGNOSIS — B20 Human immunodeficiency virus [HIV] disease: Secondary | ICD-10-CM | POA: Insufficient documentation

## 2015-11-02 DIAGNOSIS — Y9389 Activity, other specified: Secondary | ICD-10-CM | POA: Insufficient documentation

## 2015-11-02 DIAGNOSIS — Z79899 Other long term (current) drug therapy: Secondary | ICD-10-CM | POA: Insufficient documentation

## 2015-11-02 DIAGNOSIS — Y998 Other external cause status: Secondary | ICD-10-CM | POA: Insufficient documentation

## 2015-11-02 DIAGNOSIS — M62838 Other muscle spasm: Secondary | ICD-10-CM

## 2015-11-02 DIAGNOSIS — Z8611 Personal history of tuberculosis: Secondary | ICD-10-CM | POA: Insufficient documentation

## 2015-11-02 MED ORDER — CYCLOBENZAPRINE HCL 10 MG PO TABS: 10.0000 mg | ORAL_TABLET | Freq: Two times a day (BID) | ORAL | Status: DC | PRN
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
## 2015-11-04 ENCOUNTER — Encounter: Payer: Self-pay | Admitting: Family Medicine

## 2015-11-30 ENCOUNTER — Other Ambulatory Visit (HOSPITAL_COMMUNITY): Payer: Self-pay | Admitting: Orthopaedic Surgery

## 2015-12-03 ENCOUNTER — Encounter (HOSPITAL_COMMUNITY): Payer: Self-pay

## 2015-12-03 ENCOUNTER — Encounter (HOSPITAL_COMMUNITY)
Admission: RE | Admit: 2015-12-03 | Discharge: 2015-12-03 | Disposition: A | Discharge status: Home / Self Care | Payer: Self-pay | Source: Ambulatory Visit | Attending: Orthopaedic Surgery | Admitting: Orthopaedic Surgery

## 2015-12-03 DIAGNOSIS — Z01812 Encounter for preprocedural laboratory examination: Secondary | ICD-10-CM | POA: Insufficient documentation

## 2015-12-03 DIAGNOSIS — M87052 Idiopathic aseptic necrosis of left femur: Secondary | ICD-10-CM | POA: Insufficient documentation

## 2015-12-03 HISTORY — DX: Unspecified adrenocortical insufficiency: E27.40

## 2015-12-03 LAB — BASIC METABOLIC PANEL
Anion gap: 5 (ref 5–15)
BUN: 12 mg/dL (ref 6–20)
CO2: 25 mmol/L (ref 22–32)
Calcium: 9.1 mg/dL (ref 8.9–10.3)
Chloride: 108 mmol/L (ref 101–111)
Creatinine, Ser: 0.69 mg/dL (ref 0.44–1.00)
GFR calc Af Amer: >60 mL/min (ref >60)
GFR calc non Af Amer: >60 mL/min (ref >60)
Glucose, Bld: 81 mg/dL (ref 65–99)
Potassium: 4.3 mmol/L (ref 3.5–5.1)
Sodium: 138 mmol/L (ref 135–145)

## 2015-12-03 LAB — CBC
HCT: 35 % — ABNORMAL LOW (ref 36.0–46.0)
Hemoglobin: 11.7 g/dL — ABNORMAL LOW (ref 12.0–15.0)
MCH: 32 pg (ref 26.0–34.0)
MCHC: 33.4 g/dL (ref 30.0–36.0)
MCV: 95.6 fL (ref 78.0–100.0)
Platelets: 263 10*3/uL (ref 150–400)
RBC: 3.66 MIL/uL — ABNORMAL LOW (ref 3.87–5.11)
RDW: 13.5 % (ref 11.5–15.5)
WBC: 4.6 10*3/uL (ref 4.0–10.5)

## 2015-12-03 LAB — PROTIME-INR
INR: 1.07 (ref 0.00–1.49)
Prothrombin Time: 14.1 seconds (ref 11.6–15.2)

## 2015-12-03 LAB — ABO/RH: ABO/RH(D): O POS

## 2015-12-03 LAB — TYPE AND SCREEN
ABO/RH(D): O POS
Antibody Screen: NEGATIVE

## 2015-12-03 LAB — SURGICAL PCR SCREEN
MRSA, PCR: NEGATIVE
Staphylococcus aureus: POSITIVE — AB

## 2015-12-03 LAB — HCG, SERUM, QUALITATIVE: Preg, Serum: NEGATIVE

## 2015-12-03 LAB — APTT: aPTT: 38 seconds — ABNORMAL HIGH (ref 24–37)

## 2015-12-03 NOTE — Patient Instructions (Addendum)
Macye Wagenblast  12/03/2015   Your procedure is scheduled on: 12/14/15    Report to [facility] Main  Entrance take [place]  elevators to 3rd floor to  [place] at     [place] AM.  Call this number if you have problems the morning of surgery [phone]   Remember: ONLY 1 PERSON MAY GO WITH YOU TO SHORT STAY TO GET  READY MORNING OF [place].  Do not eat food or drink liquids :After Midnight.     Take these medicines the morning of surgery with A SIP OF WATER: percocet if needed, hydrocortisone, zofran if needed                                 You may not have any metal on your body including hair pins and              piercings  Do not wear jewelry, make-up, lotions, powders or perfumes, deodorant             Do not wear nail polish.  Do not shave  48 hours prior to surgery.     Do not bring valuables to the hospital. [place].  Contacts, dentures or bridgework may not be worn into surgery.  Leave suitcase in the car. After surgery it may be brought to your room.         Special Instructions: coughing and deep breathing exercises,leg exercises               Please read over the following fact sheets you were given: _____________________________________________________________________             [facility] - Preparing for Surgery Before surgery, you can play an important role.  Because skin is not sterile, your skin needs to be as free of germs as possible.  You can reduce the number of germs on your skin by washing with CHG (chlorahexidine gluconate) soap before surgery.  CHG is an antiseptic cleaner which kills germs and bonds with the skin to continue killing germs even after washing. Please DO NOT use if you have an allergy to CHG or antibacterial soaps.  If your skin becomes reddened/irritated stop using the CHG and inform your nurse when you arrive at Short Stay. Do not shave (including legs  and underarms) for at least 48 hours prior to the first CHG shower.  You may shave your face/neck. Please follow these instructions carefully:  1.  Shower with CHG Soap the night before surgery and the  morning of Surgery.  2.  If you choose to wash your hair, wash your hair first as usual with your  normal  shampoo.  3.  After you shampoo, rinse your hair and body thoroughly to remove the  shampoo.                           4.  Use CHG as you would any other liquid soap.  You can apply chg directly  to the skin and wash                       Gently with a scrungie or clean  washcloth.  5.  Apply the CHG Soap to your body ONLY FROM THE NECK DOWN.   Do not use on face/ open                           Wound or open sores. Avoid contact with eyes, ears mouth and genitals (private parts).                       Wash face,  Genitals (private parts) with your normal soap.             6.  Wash thoroughly, paying special attention to the area where your surgery  will be performed.  7.  Thoroughly rinse your body with warm water from the neck down.  8.  DO NOT shower/wash with your normal soap after using and rinsing off  the CHG Soap.                9.  Pat yourself dry with a clean towel.            10.  Wear clean pajamas.            11.  Place clean sheets on your bed the night of your first shower and do not  sleep with pets. Day of Surgery : Do not apply any lotions/deodorants the morning of surgery.  Please wear clean clothes to the hospital/surgery center.  FAILURE TO FOLLOW THESE INSTRUCTIONS MAY RESULT IN THE CANCELLATION OF YOUR SURGERY PATIENT SIGNATURE_________________________________  NURSE SIGNATURE__________________________________  ________________________________________________________________________  WHAT IS A BLOOD TRANSFUSION? Blood Transfusion Information  A transfusion is the replacement of blood or some of its parts. Blood is made up of multiple cells which provide different  functions.  Red blood cells carry oxygen and are used for blood loss replacement.  White blood cells fight against infection.  Platelets control bleeding.  Plasma helps clot blood.  Other blood products are available for specialized needs, such as hemophilia or other clotting disorders. BEFORE THE TRANSFUSION  Who gives blood for transfusions?   Healthy volunteers who are fully evaluated to make sure their blood is safe. This is blood bank blood. Transfusion therapy is the safest it has ever been in the practice of medicine. Before blood is taken from a donor, a complete history is taken to make sure that person has no history of diseases nor engages in risky social behavior (examples are intravenous drug use or sexual activity with multiple partners). The donor's travel history is screened to minimize risk of transmitting infections, such as malaria. The donated blood is tested for signs of infectious diseases, such as HIV and hepatitis. The blood is then tested to be sure it is compatible with you in order to minimize the chance of a transfusion reaction. If you or a relative donates blood, this is often done in anticipation of surgery and is not appropriate for emergency situations. It takes many days to process the donated blood. RISKS AND COMPLICATIONS Although transfusion therapy is very safe and saves many lives, the main dangers of transfusion include:   Getting an infectious disease.  Developing a transfusion reaction. This is an allergic reaction to something in the blood you were given. Every precaution is taken to prevent this. The decision to have a blood transfusion has been considered carefully by your caregiver before blood is given. Blood is not given unless the benefits outweigh the risks. AFTER THE TRANSFUSION  Right after receiving a blood transfusion, you will usually feel much better and more energetic. This is especially true if your red blood cells have gotten low  (anemic). The transfusion raises the level of the red blood cells which carry oxygen, and this usually causes an energy increase.  The nurse administering the transfusion will monitor you carefully for complications. HOME CARE INSTRUCTIONS  No special instructions are needed after a transfusion. You may find your energy is better. Speak with your caregiver about any limitations on activity for underlying diseases you may have. SEEK MEDICAL CARE IF:   Your condition is not improving after your transfusion.  You develop redness or irritation at the intravenous (IV) site. SEEK IMMEDIATE MEDICAL CARE IF:  Any of the following symptoms occur over the next 12 hours:  Shaking chills.  You have a temperature by mouth above 102 F (38.9 C), not controlled by medicine.  Chest, back, or muscle pain.  People around you feel you are not acting correctly or are confused.  Shortness of breath or difficulty breathing.  Dizziness and fainting.  You get a rash or develop hives.  You have a decrease in urine output.  Your urine turns a dark color or changes to pink, red, or brown. Any of the following symptoms occur over the next 10 days:  You have a temperature by mouth above 102 F (38.9 C), not controlled by medicine.  Shortness of breath.  Weakness after normal activity.  The white part of the eye turns yellow (jaundice).  You have a decrease in the amount of urine or are urinating less often.  Your urine turns a dark color or changes to pink, red, or brown. Document Released: 11/28/2000 Document Revised: 02/23/2012 Document Reviewed: 07/17/2008 [facility] Patient Information 2014 [place], [place].  _______________________________________________________________________

## 2015-12-03 NOTE — Progress Notes (Signed)
EKG- 02/16/15- EPIC  ECHO- 10/15- EPIC  CXR- 11/16-EPIC

## 2015-12-14 ENCOUNTER — Inpatient Hospital Stay (HOSPITAL_COMMUNITY): Payer: Medicaid Other | Admitting: Anesthesiology

## 2015-12-14 ENCOUNTER — Encounter (HOSPITAL_COMMUNITY)
Admission: RE | Disposition: A | Discharge status: Home Health | Payer: Self-pay | Source: Ambulatory Visit | Attending: Orthopaedic Surgery

## 2015-12-14 ENCOUNTER — Encounter (HOSPITAL_COMMUNITY): Payer: Self-pay | Admitting: *Deleted

## 2015-12-14 ENCOUNTER — Inpatient Hospital Stay (HOSPITAL_COMMUNITY): Payer: Medicaid Other

## 2015-12-14 ENCOUNTER — Inpatient Hospital Stay (HOSPITAL_COMMUNITY)
Admission: RE | Admit: 2015-12-14 | Discharge: 2015-12-16 | DRG: 470 | Disposition: A | Discharge status: Home Health | Payer: Medicaid Other | Source: Ambulatory Visit | Attending: Orthopaedic Surgery | Admitting: Orthopaedic Surgery

## 2015-12-14 DIAGNOSIS — Z21 Asymptomatic human immunodeficiency virus [HIV] infection status: Secondary | ICD-10-CM | POA: Diagnosis present

## 2015-12-14 DIAGNOSIS — Z8611 Personal history of tuberculosis: Secondary | ICD-10-CM | POA: Diagnosis not present

## 2015-12-14 DIAGNOSIS — M25552 Pain in left hip: Secondary | ICD-10-CM | POA: Diagnosis present

## 2015-12-14 DIAGNOSIS — M87052 Idiopathic aseptic necrosis of left femur: Secondary | ICD-10-CM | POA: Diagnosis present

## 2015-12-14 DIAGNOSIS — Z96642 Presence of left artificial hip joint: Secondary | ICD-10-CM

## 2015-12-14 DIAGNOSIS — Z01812 Encounter for preprocedural laboratory examination: Secondary | ICD-10-CM

## 2015-12-14 DIAGNOSIS — M89752 Major osseous defect, left pelvic region and thigh: Secondary | ICD-10-CM | POA: Diagnosis present

## 2015-12-14 DIAGNOSIS — Z419 Encounter for procedure for purposes other than remedying health state, unspecified: Secondary | ICD-10-CM

## 2015-12-14 HISTORY — DX: Idiopathic aseptic necrosis of left femur: M87.052

## 2015-12-14 HISTORY — PX: TOTAL HIP ARTHROPLASTY: SHX124

## 2015-12-14 HISTORY — DX: Presence of left artificial hip joint: Z96.642

## 2015-12-14 SURGERY — ARTHROPLASTY, HIP, TOTAL, ANTERIOR APPROACH
Anesthesia: General | Site: Hip | Laterality: Left

## 2015-12-14 MED ORDER — HYDROMORPHONE HCL 1 MG/ML IJ SOLN
1.0000 mg | INTRAMUSCULAR | Status: DC | PRN
  Administered 2015-12-14: 1 mg via INTRAVENOUS
  Filled 2015-12-14: qty 1

## 2015-12-14 MED ORDER — LIDOCAINE HCL (CARDIAC) 20 MG/ML IV SOLN
INTRAVENOUS | Status: DC | PRN
  Administered 2015-12-14: 80 mg via INTRATRACHEAL

## 2015-12-14 MED ORDER — PROPOFOL 10 MG/ML IV BOLUS
INTRAVENOUS | Status: AC
  Filled 2015-12-14: qty 20

## 2015-12-14 MED ORDER — OXYCODONE HCL 5 MG PO TABS
5.0000 mg | ORAL_TABLET | ORAL | Status: DC | PRN
  Administered 2015-12-14 – 2015-12-16 (×6): 10 mg via ORAL
  Filled 2015-12-14 (×6): qty 2

## 2015-12-14 MED ORDER — SUCCINYLCHOLINE CHLORIDE 20 MG/ML IJ SOLN
INTRAMUSCULAR | Status: DC | PRN
  Administered 2015-12-14: 100 mg via INTRAVENOUS

## 2015-12-14 MED ORDER — LACTATED RINGERS IV SOLN: INTRAVENOUS | Status: DC

## 2015-12-14 MED ORDER — ONDANSETRON HCL 4 MG PO TABS: 4.0000 mg | ORAL_TABLET | Freq: Four times a day (QID) | ORAL | Status: DC | PRN

## 2015-12-14 MED ORDER — ASPIRIN EC 325 MG PO TBEC
325.0000 mg | DELAYED_RELEASE_TABLET | Freq: Two times a day (BID) | ORAL | Status: DC
  Administered 2015-12-15 – 2015-12-16 (×3): 325 mg via ORAL
  Filled 2015-12-14 (×5): qty 1

## 2015-12-14 MED ORDER — DEXAMETHASONE SODIUM PHOSPHATE 10 MG/ML IJ SOLN
INTRAMUSCULAR | Status: AC
  Filled 2015-12-14: qty 1

## 2015-12-14 MED ORDER — METHOCARBAMOL 1000 MG/10ML IJ SOLN
500.0000 mg | Freq: Four times a day (QID) | INTRAVENOUS | Status: DC | PRN
  Administered 2015-12-14: 500 mg via INTRAVENOUS
  Filled 2015-12-14 (×2): qty 5

## 2015-12-14 MED ORDER — MIDAZOLAM HCL 5 MG/5ML IJ SOLN
INTRAMUSCULAR | Status: DC | PRN
  Administered 2015-12-14: 2 mg via INTRAVENOUS

## 2015-12-14 MED ORDER — CEFAZOLIN SODIUM-DEXTROSE 2-3 GM-% IV SOLR
INTRAVENOUS | Status: AC
  Filled 2015-12-14: qty 50

## 2015-12-14 MED ORDER — MENTHOL 3 MG MT LOZG: 1.0000 | LOZENGE | OROMUCOSAL | Status: DC | PRN

## 2015-12-14 MED ORDER — HYDROMORPHONE HCL 1 MG/ML IJ SOLN
INTRAMUSCULAR | Status: AC
  Filled 2015-12-14: qty 1

## 2015-12-14 MED ORDER — METOCLOPRAMIDE HCL 5 MG/ML IJ SOLN: 5.0000 mg | Freq: Three times a day (TID) | INTRAMUSCULAR | Status: DC | PRN

## 2015-12-14 MED ORDER — LACTATED RINGERS IV SOLN
INTRAVENOUS | Status: DC | PRN
  Administered 2015-12-14 (×2): via INTRAVENOUS

## 2015-12-14 MED ORDER — ACETAMINOPHEN 650 MG RE SUPP: 650.0000 mg | Freq: Four times a day (QID) | RECTAL | Status: DC | PRN

## 2015-12-14 MED ORDER — HYDROMORPHONE HCL 1 MG/ML IJ SOLN
0.2500 mg | INTRAMUSCULAR | Status: DC | PRN
  Administered 2015-12-14 (×4): 0.5 mg via INTRAVENOUS

## 2015-12-14 MED ORDER — CEFAZOLIN SODIUM 1-5 GM-% IV SOLN
1.0000 g | Freq: Four times a day (QID) | INTRAVENOUS | Status: AC
  Administered 2015-12-14 (×2): 1 g via INTRAVENOUS
  Filled 2015-12-14 (×2): qty 50

## 2015-12-14 MED ORDER — 0.9 % SODIUM CHLORIDE (POUR BTL) OPTIME
TOPICAL | Status: DC | PRN
  Administered 2015-12-14: 1000 mL

## 2015-12-14 MED ORDER — SODIUM CHLORIDE 0.9 % IV SOLN
INTRAVENOUS | Status: DC
  Administered 2015-12-14: 14:00:00 via INTRAVENOUS

## 2015-12-14 MED ORDER — ONDANSETRON HCL 4 MG/2ML IJ SOLN
4.0000 mg | Freq: Four times a day (QID) | INTRAMUSCULAR | Status: DC | PRN
  Administered 2015-12-14: 4 mg via INTRAVENOUS
  Filled 2015-12-14: qty 2

## 2015-12-14 MED ORDER — PROMETHAZINE HCL 25 MG/ML IJ SOLN: 12.5000 mg | Freq: Four times a day (QID) | INTRAMUSCULAR | Status: DC | PRN

## 2015-12-14 MED ORDER — HYDROMORPHONE HCL 1 MG/ML IJ SOLN
INTRAMUSCULAR | Status: DC | PRN
  Administered 2015-12-14 (×3): .4 mg via INTRAVENOUS

## 2015-12-14 MED ORDER — METHOCARBAMOL 500 MG PO TABS: 500.0000 mg | ORAL_TABLET | Freq: Four times a day (QID) | ORAL | Status: DC | PRN

## 2015-12-14 MED ORDER — LIDOCAINE HCL (CARDIAC) 20 MG/ML IV SOLN
INTRAVENOUS | Status: AC
  Filled 2015-12-14: qty 5

## 2015-12-14 MED ORDER — POLYETHYLENE GLYCOL 3350 17 G PO PACK: 17.0000 g | PACK | Freq: Every day | ORAL | Status: DC | PRN

## 2015-12-14 MED ORDER — EMTRICITABINE-TENOFOVIR AF 200-25 MG PO TABS
1.0000 | ORAL_TABLET | Freq: Every day | ORAL | Status: DC
  Administered 2015-12-14 – 2015-12-16 (×3): 1 via ORAL
  Filled 2015-12-14 (×3): qty 1

## 2015-12-14 MED ORDER — ZOLPIDEM TARTRATE 5 MG PO TABS: 5.0000 mg | ORAL_TABLET | Freq: Every evening | ORAL | Status: DC | PRN

## 2015-12-14 MED ORDER — PHENOL 1.4 % MT LIQD
1.0000 | OROMUCOSAL | Status: DC | PRN
  Filled 2015-12-14: qty 177

## 2015-12-14 MED ORDER — ALUM & MAG HYDROXIDE-SIMETH 200-200-20 MG/5ML PO SUSP: 30.0000 mL | ORAL | Status: DC | PRN

## 2015-12-14 MED ORDER — ACETAMINOPHEN 325 MG PO TABS: 650.0000 mg | ORAL_TABLET | Freq: Four times a day (QID) | ORAL | Status: DC | PRN

## 2015-12-14 MED ORDER — HYDROCORTISONE 5 MG PO TABS
10.0000 mg | ORAL_TABLET | Freq: Every day | ORAL | Status: DC
  Administered 2015-12-15 – 2015-12-16 (×2): 10 mg via ORAL
  Filled 2015-12-14 (×3): qty 2

## 2015-12-14 MED ORDER — HYDROMORPHONE HCL 2 MG/ML IJ SOLN
INTRAMUSCULAR | Status: AC
  Filled 2015-12-14: qty 1

## 2015-12-14 MED ORDER — MECLIZINE HCL 12.5 MG PO TABS
32.0000 mg | ORAL_TABLET | Freq: Three times a day (TID) | ORAL | Status: DC | PRN
  Filled 2015-12-14: qty 0.5

## 2015-12-14 MED ORDER — DARUNAVIR-COBICISTAT 800-150 MG PO TABS
1.0000 | ORAL_TABLET | Freq: Every day | ORAL | Status: DC
  Administered 2015-12-14 – 2015-12-16 (×3): 1 via ORAL
  Filled 2015-12-14 (×3): qty 1

## 2015-12-14 MED ORDER — ONDANSETRON HCL 4 MG/2ML IJ SOLN
INTRAMUSCULAR | Status: DC | PRN
  Administered 2015-12-14: 4 mg via INTRAVENOUS

## 2015-12-14 MED ORDER — SODIUM CHLORIDE 0.9 % IR SOLN
Status: DC | PRN
  Administered 2015-12-14: 1000 mL

## 2015-12-14 MED ORDER — DOCUSATE SODIUM 100 MG PO CAPS
100.0000 mg | ORAL_CAPSULE | Freq: Two times a day (BID) | ORAL | Status: DC
  Administered 2015-12-14 – 2015-12-16 (×4): 100 mg via ORAL

## 2015-12-14 MED ORDER — DIPHENHYDRAMINE HCL 12.5 MG/5ML PO ELIX
12.5000 mg | ORAL_SOLUTION | ORAL | Status: DC | PRN
  Administered 2015-12-16: 25 mg via ORAL
  Filled 2015-12-14: qty 10

## 2015-12-14 MED ORDER — ONDANSETRON HCL 4 MG/2ML IJ SOLN
INTRAMUSCULAR | Status: AC
  Filled 2015-12-14: qty 2

## 2015-12-14 MED ORDER — MIDAZOLAM HCL 2 MG/2ML IJ SOLN
INTRAMUSCULAR | Status: AC
  Filled 2015-12-14: qty 2

## 2015-12-14 MED ORDER — HYDROCORTISONE 5 MG PO TABS
5.0000 mg | ORAL_TABLET | Freq: Every day | ORAL | Status: DC
  Administered 2015-12-14 – 2015-12-15 (×2): 5 mg via ORAL
  Filled 2015-12-14 (×3): qty 1

## 2015-12-14 MED ORDER — KETOROLAC TROMETHAMINE 15 MG/ML IJ SOLN
7.5000 mg | Freq: Four times a day (QID) | INTRAMUSCULAR | Status: AC
  Administered 2015-12-14 – 2015-12-15 (×4): 7.5 mg via INTRAVENOUS
  Filled 2015-12-14 (×4): qty 1

## 2015-12-14 MED ORDER — TRANEXAMIC ACID 1000 MG/10ML IV SOLN
1000.0000 mg | INTRAVENOUS | Status: AC
  Administered 2015-12-14: 1000 mg via INTRAVENOUS
  Filled 2015-12-14: qty 10

## 2015-12-14 MED ORDER — FENTANYL CITRATE (PF) 250 MCG/5ML IJ SOLN
INTRAMUSCULAR | Status: AC
  Filled 2015-12-14: qty 5

## 2015-12-14 MED ORDER — FENTANYL CITRATE (PF) 250 MCG/5ML IJ SOLN
INTRAMUSCULAR | Status: DC | PRN
  Administered 2015-12-14 (×5): 50 ug via INTRAVENOUS

## 2015-12-14 MED ORDER — METOCLOPRAMIDE HCL 10 MG PO TABS: 5.0000 mg | ORAL_TABLET | Freq: Three times a day (TID) | ORAL | Status: DC | PRN

## 2015-12-14 MED ORDER — CEFAZOLIN SODIUM-DEXTROSE 2-3 GM-% IV SOLR
2.0000 g | INTRAVENOUS | Status: AC
  Administered 2015-12-14: 2 g via INTRAVENOUS

## 2015-12-14 MED ORDER — DEXAMETHASONE SODIUM PHOSPHATE 10 MG/ML IJ SOLN
INTRAMUSCULAR | Status: DC | PRN
  Administered 2015-12-14: 10 mg via INTRAVENOUS

## 2015-12-14 MED ORDER — PROPOFOL 10 MG/ML IV BOLUS
INTRAVENOUS | Status: DC | PRN
  Administered 2015-12-14: 150 mg via INTRAVENOUS

## 2015-12-14 SURGICAL SUPPLY — 32 items
BAG ZIPLOCK 12X15 (MISCELLANEOUS) IMPLANT
BENZOIN TINCTURE PRP APPL 2/3 (GAUZE/BANDAGES/DRESSINGS) ×3 IMPLANT
BLADE SAW SGTL 18X1.27X75 (BLADE) ×2 IMPLANT
BLADE SAW SGTL 18X1.27X75MM (BLADE) ×1
CAPT HIP TOTAL 2 ×3 IMPLANT
CELLS DAT CNTRL 66122 CELL SVR (MISCELLANEOUS) ×1 IMPLANT
CLOSURE WOUND 1/2 X4 (GAUZE/BANDAGES/DRESSINGS) ×1
CLOTH BEACON ORANGE TIMEOUT ST (SAFETY) ×3 IMPLANT
DRAPE STERI IOBAN 125X83 (DRAPES) ×3 IMPLANT
DRAPE U-SHAPE 47X51 STRL (DRAPES) ×6 IMPLANT
DRSG AQUACEL AG ADV 3.5X10 (GAUZE/BANDAGES/DRESSINGS) ×3 IMPLANT
DURAPREP 26ML APPLICATOR (WOUND CARE) ×3 IMPLANT
ELECT REM PT RETURN 9FT ADLT (ELECTROSURGICAL) ×3
ELECTRODE REM PT RTRN 9FT ADLT (ELECTROSURGICAL) ×1 IMPLANT
GLOVE BIO SURGEON STRL SZ7.5 (GLOVE) ×3 IMPLANT
GLOVE BIOGEL PI IND STRL 8 (GLOVE) ×2 IMPLANT
GLOVE BIOGEL PI INDICATOR 8 (GLOVE) ×4
GLOVE ECLIPSE 8.0 STRL XLNG CF (GLOVE) ×3 IMPLANT
GOWN STRL REUS W/TWL XL LVL3 (GOWN DISPOSABLE) ×6 IMPLANT
HANDPIECE INTERPULSE COAX TIP (DISPOSABLE) ×2
HOLDER FOLEY CATH W/STRAP (MISCELLANEOUS) ×3 IMPLANT
PACK ANTERIOR HIP CUSTOM (KITS) ×3 IMPLANT
RTRCTR WOUND ALEXIS 18CM MED (MISCELLANEOUS) ×3
SET HNDPC FAN SPRY TIP SCT (DISPOSABLE) ×1 IMPLANT
STRIP CLOSURE SKIN 1/2X4 (GAUZE/BANDAGES/DRESSINGS) ×2 IMPLANT
SUT ETHIBOND NAB CT1 #1 30IN (SUTURE) ×3 IMPLANT
SUT MNCRL AB 4-0 PS2 18 (SUTURE) ×3 IMPLANT
SUT VIC AB 0 CT1 36 (SUTURE) ×3 IMPLANT
SUT VIC AB 1 CT1 36 (SUTURE) ×3 IMPLANT
SUT VIC AB 2-0 CT1 27 (SUTURE) ×4
SUT VIC AB 2-0 CT1 TAPERPNT 27 (SUTURE) ×2 IMPLANT
TRAY FOLEY W/METER SILVER 14FR (SET/KITS/TRAYS/PACK) ×3 IMPLANT

## 2015-12-14 NOTE — Progress Notes (Signed)
Utilization review completed. Lovelace Cerveny, RN, BSN. 

## 2015-12-14 NOTE — Op Note (Signed)
NAMEHEARTLY, HAUPTMAN                  ACCOUNT NO.:  [id number]  MEDICAL RECORD NO.:  [id number]  LOCATION:  [facility]                         FACILITY:  [facility]  PHYSICIAN:  Lind Guest. Ninfa Linden, M.D.DATE OF BIRTH:  [birth year]  DATE OF PROCEDURE:  12/14/2015 DATE OF DISCHARGE:                              OPERATIVE REPORT   PREOPERATIVE DIAGNOSIS:  Avascular necrosis, left hip with femoral head collapse.  POSTOPERATIVE DIAGNOSIS:  Avascular necrosis, left hip with femoral head collapse.  PROCEDURE:  Left total hip arthroplasty through direct anterior approach.  IMPLANTS:  DePuy Sector Gription acetabular component size 48, size 32+ 0 polyethylene in liner, size 10 Corail femoral component with standard offset, size 32+ 1 ceramic hip ball.  SURGEON:  Lind Guest. Ninfa Linden, M.D.  ASSISTANT:  Erskine Emery, PA-C.  ANESTHESIA:  General.  ANTIBIOTICS:  A 2 g IV Ancef.  BLOOD LOSS:  Less than 500 mL.  COMPLICATIONS:  None.  INDICATIONS:  Kelsey Rodriguez is a very pleasant 24 year old who originally is from [place] in [place] who has developed a left hip avascular necrosis with femoral head collapse.  Her pain is severe.  It has been worsening over a year and rapidly getting worse over the last few weeks.  The x- rays do show collapse of the femoral head.  Her AVM was confirmed also with MRI.  She is someone who is HIV positive and but is not on retroviral drugs and is in good health.  Now, she is followed by I believe Dr. Carlyle Basques from the [facility].  At this point, with the patient's daily pain and her decrease in mobility, activities of daily living, and quality of life, she wished to proceed with a total hip arthroplasty through a direct anterior approach.  I explained in detail to her the risks of acute blood loss anemia, nerve and vessel injury, fracture, infection, DVT, and dislocation.  She understands her goals are decreased pain, improved mobility,  and improved quality of life.  Given the fact that her HIV status is under good control with good treatment, I do believe surgery is warranted at this point.  PROCEDURE DESCRIPTION:  After informed consent was obtained, appropriate left hip was marked, she was brought to the operating room where general anesthesia was obtained while she was on her stretcher.  A Foley catheter was placed.  Then, both feet had traction boots applied to them.  Next, she was placed supine on the Hana fracture table with perineal post in place and both legs in inline skeletal traction devices but no traction applied.  Her left operative hip was then prepped and draped with DuraPrep and sterile drapes.  Time-out was called.  She was identified as the correct patient, correct left hip.  We then made an incision inferior and posterior to the anterior-suprailiac iliac spine and carried this obliquely down the leg.  We dissected down the tensor fascia lata muscle and the tensor fascia was then divided longitudinally so we could proceed with a direct anterior approach to the hip.  We identified and cauterized the lateral femoral circumflex vessels and then identified the hip femoral neck, placed Cobra retractors around the lateral medial  femoral neck.  We then opened up the hip capsule in L type format finding a large joint effusion consistent with avascular necrosis.  We placed our Cobra retractors in the hip capsule.  We then made our femoral neck cut with an oscillating saw just proximal to the lesser trochanter and completed this with an osteotome.  We placed corkscrew guide in the femoral head and removed the femoral head in its entirety and found it to be soft with a large area of cartilage completely broken off.  We cleaned the acetabular remnants of the ring, cartilage in the acetabular labrum, and other debris, and placed a bent Hohmann across the medial acetabular rim and released the  transverse acetabular ligament.  We then began reaming from a size 42 reamer only up to a size 47 with all reamers under direct visualization and last reamer under direct fluoroscopy so we could obtain our depth of reaming, our inclination, anteversion.  Once we were pleased with this, we placed DePuy Sector Gription acetabular component size 48 and a 32+ 0 neutral polyethylene liner.  Attention was then turned to the femur.  With the leg externally rotated to 100 degrees, extended and abducted, we were able to release the joint capsule.  I placed a Mueller retractor medially and a Hohmann retractor behind the greater trochanter.  We used a box cutting osteotome to enter the femoral canal and a rongeur to lateralize.  We then made our broaching from a size 8 broach up to a size 10.  With the size 10 being a tight fit, we trialed a standard offset femoral neck and a 32+ 1 hip ball.  We rolled the leg back over and up with traction and internal rotation.  We were pleased with radiographs of her leg length and offset, and we were pleased with her range of motion and stability on manipulation.  We then dislocated the hip and removed the trial components.  We then placed the real Corail femoral component with standard offset size 10, followed by the real 32+ 1 ceramic hip ball.  We reduced this back in the acetabulum.  We were pleased with stability.  We then irrigated the soft tissue with normal saline solution using pulsatile lavage.  We closed the joint capsule with interrupted #1 Ethibond suture, followed by a running #1 Vicryl in the tensor fascia, 0 Vicryl in the deep tissue. 2-0 Vicryl in subcutaneous tissue, 4-0 Monocryl subcuticular stitch, and Steri-Strips on the skin.  An Aquacel dressing was applied.  She was taken off the Hana table, awakened, extubated, and taken to recovery room in stable condition.  All final counts were correct.  There were no complications noted.  Of note,  Benita Stabile, PA-C assisted in the entire case.  His assistance was crucial in facilitating all aspects of this case.     Lind Guest. Ninfa Linden, M.D.     CYB/MEDQ  D:  12/14/2015  T:  12/14/2015  Job:  [id number]  cc:   Carlyle Basques, MD

## 2015-12-14 NOTE — Anesthesia Procedure Notes (Signed)
Procedure Name: Intubation Date/Time: 12/14/2015 9:39 AM Performed by: Dione Booze Pre-anesthesia Checklist: Emergency Drugs available, Suction available, Patient being monitored and Patient identified Patient Re-evaluated:Patient Re-evaluated prior to inductionOxygen Delivery Method: Circle system utilized Preoxygenation: Pre-oxygenation with 100% oxygen Intubation Type: IV induction Grade View: Grade I Tube type: Oral Tube size: 7.5 mm Number of attempts: 1 Airway Equipment and Method: Stylet Placement Confirmation: ETT inserted through vocal cords under direct vision,  breath sounds checked- equal and bilateral and positive ETCO2 Secured at: 21 cm Tube secured with: Tape Dental Injury: Teeth and Oropharynx as per pre-operative assessment

## 2015-12-14 NOTE — Anesthesia Postprocedure Evaluation (Signed)
Anesthesia Post Note  Patient: Kelsey Rodriguez  Procedure(s) Performed: Procedure(s) (LRB): LEFT TOTAL HIP ARTHROPLASTY ANTERIOR APPROACH (Left)  Patient location during evaluation: PACU Anesthesia Type: General Level of consciousness: awake and alert Pain management: pain level controlled Vital Signs Assessment: post-procedure vital signs reviewed and stable Respiratory status: spontaneous breathing, nonlabored ventilation, respiratory function stable and patient connected to nasal cannula oxygen Cardiovascular status: blood pressure returned to baseline and stable Postop Assessment: no signs of nausea or vomiting Anesthetic complications: no    Last Vitals:  Filed Vitals:   12/14/15 1401 12/14/15 1437  BP: 135/61 123/67  Pulse: 95 96  Temp: 36.9 C 36.6 C  Resp: 16 16    Last Pain:  Filed Vitals:   12/14/15 1438  PainSc: 3                  Donjuan Robison L

## 2015-12-14 NOTE — Anesthesia Preprocedure Evaluation (Signed)
Anesthesia Evaluation  Patient identified by MRN, date of birth, ID band Patient awake    Reviewed: Allergy & Precautions, H&P , NPO status , Patient's Chart, lab work & pertinent test results  Airway Mallampati: II  TM Distance: >3 FB Neck ROM: full    Dental no notable dental hx. 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   Dental Advisory Given  Plan Discussed with: CRNA and Surgeon  Anesthesia Plan Comments:         Anesthesia Quick Evaluation

## 2015-12-14 NOTE — Brief Op Note (Signed)
12/14/2015  10:57 AM  PATIENT:  Kelsey Rodriguez  24 y.o. female  PRE-OPERATIVE DIAGNOSIS:  avascular necrosis left hip  POST-OPERATIVE DIAGNOSIS:  avascular necrosis left hip  PROCEDURE:  Procedure(s): LEFT TOTAL HIP ARTHROPLASTY ANTERIOR APPROACH (Left)  SURGEON:  Surgeon(s) and Role:    * Mcarthur Rossetti, MD - Primary  PHYSICIAN ASSISTANT: Benita Stabile, PA-C  ANESTHESIA:   general  EBL:  Total I/O In: 1000 [I.V.:1000] Out: 400 [Urine:200; Blood:200]  BLOOD ADMINISTERED:none  DRAINS: none   LOCAL MEDICATIONS USED:  NONE  SPECIMEN:  No Specimen  DISPOSITION OF SPECIMEN:  N/A  COUNTS:  YES  TOURNIQUET:  * No tourniquets in log *  DICTATION: .Other Dictation: Dictation Number [id number]  PLAN OF CARE: Admit to inpatient   PATIENT DISPOSITION:  PACU - hemodynamically stable.   Delay start of Pharmacological VTE agent (>24hrs) due to surgical blood loss or risk of bleeding: no

## 2015-12-14 NOTE — H&P (Signed)
TOTAL HIP ADMISSION H&P  Patient is admitted for left total hip arthroplasty.  Subjective:  Chief Complaint: left hip pain  HPI: Kelsey Rodriguez, 24 y.o. female, has a history of pain and functional disability in the left hip(s) due to avascular necrosis and patient has failed non-surgical conservative treatments for greater than 12 weeks to include NSAID's and/or analgesics and activity modification.  Onset of symptoms was abrupt starting 1 years ago with rapidlly worsening course since that time.The patient noted no past surgery on the left hip(s).  Patient currently rates pain in the left hip at 10 out of 10 with activity. Patient has night pain, worsening of pain with activity and weight bearing and pain that interfers with activities of daily living. Patient has evidence of subchondral cysts, joint space narrowing and femoral head collapse by imaging studies. This condition presents safety issues increasing the risk of falls.  There is no current active infection.  Patient Active Problem List   Diagnosis Date Noted  . Avascular necrosis of bone of left hip ([place]) 12/14/2015  . 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 Nausea with vomiting 05/18/2015  . Overweight (BMI 25.0-29.9) 04/20/2015  . Seasonal allergies 03/20/2015  . Menorrhagia 03/19/2015  . Back pain of lumbar region with sciatica 02/12/2015  . Dizziness and giddiness 01/23/2015  . Primary adrenal insufficiency ([place]) 01/03/2015  . Fatigue 09/25/2014  . Chest pain 07/07/2013  . Laceration of ankle, right 11/18/2012  . 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 Vaginal discharge 05/30/2012  . Arthralgia 05/20/2012  . HIV (human immunodeficiency virus infection) ([place]) 03/16/2012  . Dysphagia 03/11/2012  . 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 Iron deficiency anemia 03/11/2012  . Reflux esophagitis 03/11/2012   Past Medical History  Diagnosis Date  . Tuberculosis   . HIV (human immunodeficiency virus infection) ([place]) 02/2012   . Pelvic pain   . 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 Bell's palsy 08/26/2013  . Anemia of chronic disease 03/11/2012  . 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 Laceration of ankle, right 11/18/2012  . Reflux esophagitis 03/11/2012  . 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 Vertigo   . Chronic back pain   . Lumbar radiculopathy   . Chronic leg pain     bilateral knees, ankles  . Fatigue   . Adrenal insufficiency [facility])     Past Surgical History  Procedure Laterality Date  . Esophagogastroduodenoscopy  03/11/2012    Procedure: ESOPHAGOGASTRODUODENOSCOPY (EGD);  Surgeon: Lafayette Dragon, MD;  Location: [facility] ENDOSCOPY;  Service: Endoscopy;  Laterality: N/A;  . Appendectomy  ~ 2000  . Lung biopsy  02/2012  . Dilation and curettage of uterus  2008  . Esophagogastroduodenoscopy N/A 03/07/2014    Procedure: ESOPHAGOGASTRODUODENOSCOPY (EGD);  Surgeon: Gatha Mayer, MD;  Location[facility] ENDOSCOPY;  Service: Endoscopy;  Laterality: N/A;    No prescriptions prior to admission   Allergies  Allergen Reactions  . Hydrocodone Itching and Nausea Only    Tolerates Oxycodone  . Tramadol Itching and Nausea Only    Tolerates oxycodone    Social History  Substance Use Topics  . Smoking status: Never Smoker   . Smokeless tobacco: Never Used  . Alcohol Use: No     Comment: socially    Family History  Problem Relation Age of Onset  . Heart disease Father     Vague not clearly cardiac     Review of Systems  Musculoskeletal: Positive for joint pain.  All other systems reviewed and are negative.   Objective:  Physical Exam  Constitutional: She is oriented to person, place, and time. She appears well-developed and well-nourished.  HENT:  Head: Normocephalic and atraumatic.  Eyes: EOM are normal. Pupils are equal, round, and reactive to light.  Neck: Normal range of motion. Neck supple.  Cardiovascular: Normal rate and regular rhythm.   Respiratory:  Effort normal and breath sounds normal.  GI: Soft. Bowel sounds are normal.  Musculoskeletal:       Left hip: She exhibits decreased range of motion, decreased strength, tenderness and bony tenderness.  Neurological: She is alert and oriented to person, place, and time.  Skin: Skin is warm and dry.  Psychiatric: She has a normal mood and affect.    Vital signs in last 24 hours:    Labs:   Estimated body mass index is 27.11 kg/(m^2) as calculated from the following:   Height as of 11/02/15: 5\' 3"  (1.6 m).   Weight as of 11/02/15: 69.4 kg (153 lb).   Imaging Review Plain radiographs demonstrate avascular necrosis of the left hip  Assessment/Plan:  AVN left hip  The patient history, physical examination, clinical judgement of the provider and imaging studies are consistent with avascular necrosis of the left hip(s) and total hip arthroplasty is deemed medically necessary. The treatment options including medical management, injection therapy, arthroscopy and arthroplasty were discussed at length. The risks and benefits of total hip arthroplasty were presented and reviewed. The risks due to aseptic loosening, infection, stiffness, dislocation/subluxation,  thromboembolic complications and other imponderables were discussed.  The patient acknowledged the explanation, agreed to proceed with the plan and consent was signed. Patient is being admitted for inpatient treatment for surgery, pain control, PT, OT, prophylactic antibiotics, VTE prophylaxis, progressive ambulation and ADL's and discharge planning.The patient is planning to be discharged home with home health services

## 2015-12-14 NOTE — Evaluation (Signed)
Physical Therapy Evaluation Patient Details Name: Kelsey Rodriguez MRN: [id number] DOB: 1991/10/18 Today's Date: 12/14/2015   History of Present Illness  s/p L THR; hx AVN bil hips, TB, HIV and chronic back pain  Clinical Impression  Pt s/p L THR presents with deceased L LE strength/ROM and post op pain limiting functional mobility.  Pt should progress to dc home with family assist and HHPT follow up.    Follow Up Recommendations Home health PT    Equipment Recommendations  Rolling walker with 5" wheels    Recommendations for Other Services OT consult     Precautions / Restrictions Precautions Precautions: Fall Restrictions Weight Bearing Restrictions: No Other Position/Activity Restrictions: WBAT      Mobility  Bed Mobility Overal bed mobility: Needs Assistance Bed Mobility: Supine to Sit     Supine to sit: Min assist;Mod assist     General bed mobility comments: cues for sequence and use of R LE to self assist  Transfers Overall transfer level: Needs assistance Equipment used: Rolling walker (2 wheeled) Transfers: Sit to/from Stand Sit to Stand: Min assist;Mod assist         General transfer comment: cues for LE management and use of UEs to self assist  Ambulation/Gait Ambulation/Gait assistance: Min assist Ambulation Distance (Feet): 58 Feet Assistive device: Rolling walker (2 wheeled) Gait Pattern/deviations: Step-to pattern;Decreased step length - right;Decreased step length - left;Shuffle;Trunk flexed Gait velocity: decr   General Gait Details: cues for posture, sequence and position from [employer] Mobility    Modified Rankin (Stroke Patients Only)       Balance                                             Pertinent Vitals/Pain Pain Assessment: 0-10 Pain Score: 3  Pain Location: L hip/thigh Pain Descriptors / Indicators: Aching;Sore Pain Intervention(s): Limited activity within patient's  tolerance;Monitored during session;Premedicated before session;Ice applied    Home Living Family/patient expects to be discharged to:: Private residence Living Arrangements: Parent Available Help at Discharge: Family Type of Home: House Home Access: Stairs to enter Entrance Stairs-Rails: Psychiatric nurse of Steps: [place]: One level Home Equipment: None      Prior Function Level of Independence: Needs assistance      ADL's / Homemaking Assistance Needed: Needed assist with lower body dressing and bathing        Hand Dominance        Extremity/Trunk Assessment   Upper Extremity Assessment: Overall WFL for tasks assessed           Lower Extremity Assessment: LLE deficits/detail      Cervical / Trunk Assessment: Normal  Communication   Communication: No difficulties  Cognition Arousal/Alertness: Awake/alert Behavior During Therapy: WFL for tasks assessed/performed Overall Cognitive Status: Within Functional Limits for tasks assessed                      General Comments      Exercises Total Joint Exercises Ankle Circles/Pumps: AROM;Both;15 reps;Supine      Assessment/Plan    PT Assessment Patient needs continued PT services  PT Diagnosis Difficulty walking   PT Problem List Decreased strength;Decreased range of motion;Decreased activity tolerance;Decreased mobility;Decreased knowledge of use of DME;Pain  PT Treatment Interventions DME instruction;Gait training;Stair training;Functional  mobility training;Therapeutic activities;Therapeutic exercise;Patient/family education   PT Goals (Current goals can be found in the Care Plan section) Acute Rehab PT Goals Patient Stated Goal: Regain IND with less pain than pre-op PT Goal Formulation: With patient Time For Goal Achievement: 12/19/15 Potential to Achieve Goals: Good    Frequency 7X/week   Barriers to discharge        Co-evaluation               End of  Session Equipment Utilized During Treatment: Gait belt Activity Tolerance: Patient tolerated treatment well Patient left: in chair;with call bell/phone within reach Nurse Communication: Mobility status         Time: [id number] PT Time Calculation (min) (ACUTE ONLY): 29 min   Charges:   PT Evaluation $Initial PT Evaluation Tier I: 1 Procedure PT Treatments $Gait Training: 8-22 mins   PT G Codes:        Demetrice Amstutz 2015/12/17, 6:17 PM

## 2015-12-14 NOTE — Transfer of Care (Signed)
Immediate Anesthesia Transfer of Care Note  Patient: Kelsey Rodriguez  Procedure(s) Performed: Procedure(s): LEFT TOTAL HIP ARTHROPLASTY ANTERIOR APPROACH (Left)  Patient Location: PACU  Anesthesia Type:General  Level of Consciousness:  sedated, patient cooperative and responds to stimulation  Airway & Oxygen Therapy:Patient Spontanous Breathing and Patient connected to face mask oxgen  Post-op Assessment:  Report given to PACU RN and Post -op Vital signs reviewed and stable  Post vital signs:  Reviewed and stable  Last Vitals:  Filed Vitals:   12/14/15 0750 12/14/15 1128  BP: 131/82   Pulse: 86   Temp: 36.7 C 37.3 C  Resp: 18     Complications: No apparent anesthesia complications

## 2015-12-15 LAB — CBC
HCT: 29.9 % — ABNORMAL LOW (ref 36.0–46.0)
Hemoglobin: 10.2 g/dL — ABNORMAL LOW (ref 12.0–15.0)
MCH: 31.8 pg (ref 26.0–34.0)
MCHC: 34.1 g/dL (ref 30.0–36.0)
MCV: 93.1 fL (ref 78.0–100.0)
Platelets: 275 10*3/uL (ref 150–400)
RBC: 3.21 MIL/uL — ABNORMAL LOW (ref 3.87–5.11)
RDW: 13.2 % (ref 11.5–15.5)
WBC: 13.9 10*3/uL — ABNORMAL HIGH (ref 4.0–10.5)

## 2015-12-15 LAB — BASIC METABOLIC PANEL
Anion gap: 8 (ref 5–15)
BUN: 11 mg/dL (ref 6–20)
CO2: 21 mmol/L — ABNORMAL LOW (ref 22–32)
Calcium: 8.8 mg/dL — ABNORMAL LOW (ref 8.9–10.3)
Chloride: 107 mmol/L (ref 101–111)
Creatinine, Ser: 0.56 mg/dL (ref 0.44–1.00)
GFR calc Af Amer: >60 mL/min (ref >60)
GFR calc non Af Amer: >60 mL/min (ref >60)
Glucose, Bld: 137 mg/dL — ABNORMAL HIGH (ref 65–99)
Potassium: 4.3 mmol/L (ref 3.5–5.1)
Sodium: 136 mmol/L (ref 135–145)

## 2015-12-15 NOTE — Evaluation (Signed)
Occupational Therapy Evaluation Patient Details Name: Kelsey Rodriguez MRN: [id number] DOB: Jul 30, 1991 Today's Date: 12/15/2015    History of Present Illness s/p L THR; hx AVN bil hips, TB, HIV and chronic back pain   Clinical Impression   Patient evaluated by Occupational Therapy with no further acute OT needs identified. All education has been completed and the patient has no further questions. Pt is doing very well with ADLs.  Needs min A for LB ADLs, but should quickly progress to mod I and family available as needed.   All education complted.  See below for any follow-up Occupational Therapy or equipment needs. OT is signing off. Thank you for this referral.      Follow Up Recommendations  No OT follow up;Supervision/Assistance - 24 hour    Equipment Recommendations  None recommended by OT    Recommendations for Other Services       Precautions / Restrictions Precautions Precautions: Fall Restrictions Weight Bearing Restrictions: No Other Position/Activity Restrictions: WBAT      Mobility Bed Mobility    Transfers Overall transfer level: Needs assistance Equipment used: Rolling walker (2 wheeled) Transfers: Sit to/from [facility] Sit to Stand: Min guard Stand pivot transfers: Min guard           Balance                                            ADL Overall ADL's : Needs assistance/impaired Eating/Feeding: Independent   Grooming: Wash/dry hands;Wash/dry face;Oral care;Min guard;Standing   Upper Body Bathing: Set up;Sitting   Lower Body Bathing: Minimal assistance;Sit to/from stand   Upper Body Dressing : Set up;Sitting   Lower Body Dressing: Minimal assistance;Sit to/from stand   Toilet Transfer: Min guard;Ambulation;Comfort height toilet;RW   Toileting- Water quality scientist and Hygiene: Min guard;Sit to/from stand   Tub/ Shower Transfer: Min guard;Ambulation;Tub transfer;Rolling walker;Grab bars   Functional  mobility during ADLs: Min guard;Rolling walker General ADL Comments: Pt requires min A to don Lt sock and wash Lt foot, but is very close to being able to access her Lt foot. Her family will be available to assist as needed at discharge.  Encouraged her to attempt to do ADLs independently before asking for assistance.   Pt instructed in use of 3in1 commode for tub seat and verbalized understanding      Vision     Perception     Praxis      Pertinent Vitals/Pain Pain Assessment: Faces Pain Score: 4  Faces Pain Scale: Hurts little more Pain Location: Lt hipe  Pain Descriptors / Indicators: Aching;Cramping Pain Intervention(s): Monitored during session     Hand Dominance Right   Extremity/Trunk Assessment Upper Extremity Assessment Upper Extremity Assessment: Overall WFL for tasks assessed   Lower Extremity Assessment Lower Extremity Assessment: Defer to PT evaluation   Cervical / Trunk Assessment Cervical / Trunk Assessment: Normal   Communication Communication Communication: No difficulties   Cognition Arousal/Alertness: Awake/alert Behavior During Therapy: WFL for tasks assessed/performed Overall Cognitive Status: Within Functional Limits for tasks assessed                     General Comments       Exercises Exercises: Total Joint     Shoulder Instructions      Home Living Family/patient expects to be discharged to:: Private residence Living Arrangements: Parent Available Help at  Discharge: Family Type of Home: House Home Access: Stairs to enter Technical brewer of Steps: 6 Entrance Stairs-Rails: Right;Left Home Layout: One level     Bathroom Shower/Tub: Tub/shower unit;Curtain Shower/tub characteristics: Architectural technologist: Standard     Home Equipment: Grab bars - tub/shower (3in1 has been delivered )          Prior Functioning/Environment Level of Independence: Needs assistance    ADL's / Homemaking Assistance Needed:  Needed assist with lower body dressing and bathing        OT Diagnosis: Generalized weakness;Acute pain   OT Problem List:     OT Treatment/Interventions:      OT Goals(Current goals can be found in the care plan section) Acute Rehab OT Goals Patient Stated Goal: to be able to don pants and socks  OT Goal Formulation: All assessment and education complete, DC therapy  OT Frequency:     Barriers to D/C:            Co-evaluation              End of Session Equipment Utilized During Treatment: Rolling walker Nurse Communication: Mobility status  Activity Tolerance: Patient tolerated treatment well Patient left: in chair;with call bell/phone within reach;with family/visitor present   Time: [id number] OT Time Calculation (min): 18 min Charges:  OT General Charges $OT Visit: 1 Procedure OT Evaluation $Initial OT Evaluation Tier I: 1 Procedure G-Codes:    Lucille Passy M 20-Dec-2015, 1:44 PM

## 2015-12-15 NOTE — Care Management Note (Signed)
Case Management Note  Patient Details  Name: Kelsey Rodriguez MRN: [id number] Date of Birth: 12/21/1990  Subjective/Objective:  LEFT TOTAL HIP ARTHROPLASTY                 Action/Plan: NCM spoke to pt and states she has applied for her disability. States she will apply for Medicaid. Ordered 3n1 and RW with [facility]. Contacted [facility] rep for [facility] PT. Explained to pt's she may need to apply for charity with [name].   Expected Discharge Date:  12/16/15               Expected Discharge Plan:  [place]  In-House Referral:  NA  Discharge planning Services  CM Consult  Post Acute Care Choice:  Home Health Choice offered to:  NA  DME Arranged:  3-N-1, Walker rolling DME Agency:  [place]:  PT [place]:  [place]  Status of Service:  Completed, signed off  Medicare Important Message Given:    Date Medicare IM Given:    Medicare IM give by:    Date Additional Medicare IM Given:    Additional Medicare Important Message give by:     If discussed at [place] of Stay Meetings, dates discussed:    Additional Comments:  Erenest Rasher, RN 12/15/2015, 10:57 AM

## 2015-12-15 NOTE — Progress Notes (Signed)
Physical Therapy Treatment Patient Details Name: Kelsey Rodriguez MRN: [id number] DOB: July 29, 1991 Today's Date: 12/15/2015    History of Present Illness s/p L THR; hx AVN bil hips, TB, HIV and chronic back pain    PT Comments    Pt progressing well with mobility and hopeful for dc tomorrow.  Follow Up Recommendations  Home health PT     Equipment Recommendations  Rolling walker with 5" wheels    Recommendations for Other Services OT consult     Precautions / Restrictions Precautions Precautions: Fall Restrictions Weight Bearing Restrictions: No Other Position/Activity Restrictions: WBAT    Mobility  Bed Mobility Overal bed mobility: Needs Assistance Bed Mobility: Sit to Supine       Sit to supine: Min assist   General bed mobility comments: cues for sequence and use of R LE to self assist  Transfers Overall transfer level: Needs assistance Equipment used: Rolling walker (2 wheeled) Transfers: Sit to/from Stand Sit to Stand: Min guard Stand pivot transfers: Min guard       General transfer comment: cues for LE management and use of UEs to self assist  Ambulation/Gait Ambulation/Gait assistance: Min guard Ambulation Distance (Feet): 250 Feet (and 20' from bathroom) Assistive device: Rolling walker (2 wheeled) Gait Pattern/deviations: Step-to pattern;Step-through pattern;Decreased step length - right;Decreased step length - left;Shuffle;Trunk flexed Gait velocity: decr   General Gait Details: cues for posture, position from RW and initial sequence   Stairs Stairs: Yes Stairs assistance: Min assist Stair Management: One rail Left;Step to pattern;Forwards;With crutches Number of Stairs: 8 General stair comments: cues for sequence and foot/crutch placement  Wheelchair Mobility    Modified Rankin (Stroke Patients Only)       Balance                                    Cognition Arousal/Alertness: Awake/alert Behavior During Therapy:  WFL for tasks assessed/performed Overall Cognitive Status: Within Functional Limits for tasks assessed                      Exercises      General Comments        Pertinent Vitals/Pain Pain Assessment: 0-10 Pain Score: 3  Faces Pain Scale: Hurts little more Pain Location: L hip Pain Descriptors / Indicators: Aching;Sore Pain Intervention(s): Limited activity within patient's tolerance;Monitored during session;Premedicated before session;Ice applied    Home Living Family/patient expects to be discharged to:: Private residence Living Arrangements: Parent Available Help at Discharge: Family Type of Home: House Home Access: Stairs to enter Entrance Stairs-Rails: Right;Left Home Layout: One level Home Equipment: Grab bars - tub/shower (3in1 has been delivered )      Prior Function Level of Independence: Needs assistance    ADL's / Homemaking Assistance Needed: Needed assist with lower body dressing and bathing     PT Goals (current goals can now be found in the care plan section) Acute Rehab PT Goals Patient Stated Goal: Walk with less pain PT Goal Formulation: With patient Time For Goal Achievement: 12/19/15 Potential to Achieve Goals: Good Progress towards PT goals: Progressing toward goals    Frequency  7X/week    PT Plan Current plan remains appropriate    Co-evaluation             End of Session Equipment Utilized During Treatment: Gait belt Activity Tolerance: Patient tolerated treatment well Patient left: in bed;with call bell/phone within reach  Time: 1437-1500 PT Time Calculation (min) (ACUTE ONLY): 23 min  Charges:  $Gait Training: 8-22 mins $Therapeutic Activity: 8-22 mins                    G Codes:      Kelsey Rodriguez 01/12/2016, 4:36 PM

## 2015-12-15 NOTE — Progress Notes (Signed)
Subjective: 1 Day Post-Op Procedure(s) (LRB): LEFT TOTAL HIP ARTHROPLASTY ANTERIOR APPROACH (Left) Patient reports pain as moderate.  No complaints progressing well with PT.  Objective: Vital signs in last 24 hours: Temp:  [97.8 F (36.6 C)-99.1 F (37.3 C)] 98.5 F (36.9 C) (12/31 0538) Pulse Rate:  [84-120] 84 (12/31 0538) Resp:  [11-16] 16 (12/31 0538) BP: (107-139)/(60-91) 107/60 mmHg (12/31 0538) SpO2:  [94 %-100 %] 100 % (12/31 0538)  Intake/Output from previous day: 12/30 0701 - 12/31 0700 In: 3230 [P.O.:600; I.V.:2525; IV Piggyback:105] Out: 1701 [Urine:1500; Emesis/NG output:1; Blood:200] Intake/Output this shift:     Recent Labs  12/15/15 0447  HGB 10.2*    Recent Labs  12/15/15 0447  WBC 13.9*  RBC 3.21*  HCT 29.9*  PLT 275    Recent Labs  12/15/15 0447  NA 136  K 4.3  CL 107  CO2 21*  BUN 11  CREATININE 0.56  GLUCOSE 137*  CALCIUM 8.8*   No results for input(s): LABPT, INR in the last 72 hours.  Sensation intact distally Intact pulses distally Dorsiflexion/Plantar flexion intact Incision: dressing C/D/I Compartment soft  Assessment/Plan: 1 Day Post-Op Procedure(s) (LRB): LEFT TOTAL HIP ARTHROPLASTY ANTERIOR APPROACH (Left) Up with therapy  Monitor WBC and Hgb.    [place] 12/15/2015, 10:12 AM

## 2015-12-15 NOTE — Progress Notes (Signed)
Physical Therapy Treatment Patient Details Name: Kelsey Rodriguez MRN: [id number] DOB: [birth year] Today's Date: 12/15/2015    History of Present Illness s/p L THR; hx AVN bil hips, TB, HIV and chronic back pain    PT Comments    Marked improvement in activity tolerance.  Pt progressing well with mobility.  Follow Up Recommendations  Home health PT     Equipment Recommendations  Rolling walker with 5" wheels    Recommendations for Other Services OT consult     Precautions / Restrictions Precautions Precautions: Fall Restrictions Weight Bearing Restrictions: No Other Position/Activity Restrictions: WBAT    Mobility  Bed Mobility Overal bed mobility: Needs Assistance Bed Mobility: Supine to Sit     Supine to sit: Min assist     General bed mobility comments: cues for sequence and use of R LE to self assist  Transfers Overall transfer level: Needs assistance Equipment used: Rolling walker (2 wheeled) Transfers: Sit to/from Stand Sit to Stand: Min assist         General transfer comment: cues for LE management and use of UEs to self assist  Ambulation/Gait Ambulation/Gait assistance: Min assist Ambulation Distance (Feet): 200 Feet Assistive device: Rolling walker (2 wheeled) Gait Pattern/deviations: Step-to pattern;Step-through pattern;Decreased step length - right;Decreased step length - left;Shuffle;Trunk flexed Gait velocity: decr   General Gait Details: cues for posture, position from RW and initial sequence   Stairs            Wheelchair Mobility    Modified Rankin (Stroke Patients Only)       Balance                                    Cognition Arousal/Alertness: Awake/alert Behavior During Therapy: WFL for tasks assessed/performed Overall Cognitive Status: Within Functional Limits for tasks assessed                      Exercises Total Joint Exercises Ankle Circles/Pumps: AROM;Both;15 reps;Supine Quad Sets:  AROM;Both;10 reps;Supine Heel Slides: AAROM;Left;20 reps;Supine Hip ABduction/ADduction: AAROM;Left;15 reps;Supine    General Comments        Pertinent Vitals/Pain Pain Assessment: 0-10 Pain Score: 4  Pain Location: L hip/thigh Pain Descriptors / Indicators: Aching;Sore Pain Intervention(s): Limited activity within patient's tolerance;Monitored during session;Premedicated before session;Ice applied    Home Living                      Prior Function            PT Goals (current goals can now be found in the care plan section) Acute Rehab PT Goals Patient Stated Goal: Regain IND with less pain than pre-op PT Goal Formulation: With patient Time For Goal Achievement: 12/19/15 Potential to Achieve Goals: Good Progress towards PT goals: Progressing toward goals    Frequency  7X/week    PT Plan Current plan remains appropriate    Co-evaluation             End of Session Equipment Utilized During Treatment: Gait belt Activity Tolerance: Patient tolerated treatment well Patient left: in chair;with call bell/phone within reach     Time: 1136-1214 PT Time Calculation (min) (ACUTE ONLY): 38 min  Charges:  $Gait Training: 23-37 mins $Therapeutic Exercise: 8-22 mins                    G Codes:  Kelsey Rodriguez 12/15/2015, 1:25 PM

## 2015-12-16 MED ORDER — ASPIRIN 325 MG PO TBEC: 325.0000 mg | DELAYED_RELEASE_TABLET | Freq: Two times a day (BID) | ORAL | Status: DC

## 2015-12-16 MED ORDER — METHOCARBAMOL 500 MG PO TABS: 500.0000 mg | ORAL_TABLET | Freq: Four times a day (QID) | ORAL | Status: DC | PRN

## 2015-12-16 MED ORDER — OXYCODONE-ACETAMINOPHEN 5-325 MG PO TABS: 1.0000 | ORAL_TABLET | Freq: Two times a day (BID) | ORAL | Status: DC | PRN

## 2015-12-16 NOTE — Discharge Summary (Signed)
Patient ID: Kelsey Rodriguez MRN: [id number] DOB/AGE: 05-02-1991 25 y.o.  Admit date: 12/14/2015 Discharge date: 12/16/2015  Admission Diagnoses:  Principal Problem:   Avascular necrosis of bone of left hip [facility]) Active Problems:   Status post total replacement of left hip   Discharge Diagnoses:  Same  Past Medical History  Diagnosis Date  . Tuberculosis   . HIV (human immunodeficiency virus infection) ([place]) 02/2012  . Pelvic pain   . 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 Bell's palsy 08/26/2013  . Anemia of chronic disease 03/11/2012  . 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 Laceration of ankle, right 11/18/2012  . Reflux esophagitis 03/11/2012  . 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 Vertigo   . Chronic back pain   . Lumbar radiculopathy   . Chronic leg pain     bilateral knees, ankles  . Fatigue   . Adrenal insufficiency ([place])     Surgeries: Procedure(s): LEFT TOTAL HIP ARTHROPLASTY ANTERIOR APPROACH on 12/14/2015   Consultants:  PT/OT  Discharged Condition: Improved  Hospital Course: Kelsey Rodriguez is an 25 y.o. female who was admitted 12/14/2015 for operative treatment ofAvascular necrosis of bone of left hip ([place]). Patient has severe unremitting pain that affects sleep, daily activities, and work/hobbies. After pre-op clearance the patient was taken to the operating room on 12/14/2015 and underwent  Procedure(s): LEFT TOTAL HIP ARTHROPLASTY ANTERIOR APPROACH.    Patient was given perioperative antibiotics: Anti-infectives    Start     Dose/Rate Route Frequency Ordered Stop   12/14/15 1600  ceFAZolin (ANCEF) IVPB 1 g/50 mL premix     1 g 100 mL/hr over 30 Minutes Intravenous Every 6 hours 12/14/15 1215 12/14/15 2219   12/14/15 1400  darunavir-cobicistat (PREZCOBIX) 800-150 MG per tablet 1 tablet     1 tablet Oral Daily 12/14/15 1215     12/14/15 1400  emtricitabine-tenofovir AF (DESCOVY) 200-25 MG per tablet 1 tablet     1 tablet  Oral Daily 12/14/15 1215     12/14/15 0810  ceFAZolin (ANCEF) IVPB 2 g/50 mL premix     2 g 100 mL/hr over 30 Minutes Intravenous On call to O.R. 12/14/15 0810 12/14/15 0944       Patient was given sequential compression devices, early ambulation, and chemoprophylaxis to prevent DVT.  Patient benefited maximally from hospital stay and there were no complications.    Recent vital signs: Patient Vitals for the past 24 hrs:  BP Temp Temp src Pulse Resp SpO2  12/16/15 1335 134/85 mmHg 98.5 F (36.9 C) Oral 90 18 100 %  12/16/15 0625 122/72 mmHg 98.7 F (37.1 C) Oral 83 16 100 %  12/15/15 1956 119/72 mmHg 98.5 F (36.9 C) Oral 80 16 100 %     Recent laboratory studies:  Recent Labs  12/15/15 0447  WBC 13.9*  HGB 10.2*  HCT 29.9*  PLT 275  NA 136  K 4.3  CL 107  CO2 21*  BUN 11  CREATININE 0.56  GLUCOSE 137*  CALCIUM 8.8*     Discharge Medications:     Medication List    TAKE these medications        aspirin 325 MG EC tablet  Take 1 tablet (325 mg total) by mouth 2 (two) times daily after a meal.     darunavir-cobicistat 800-150 MG tablet  Commonly known as:  PREZCOBIX  Take 1 tablet by mouth daily. Swallow whole. Do NOT crush, break or chew tablets. Take with  food.     emtricitabine-tenofovir AF 200-25 MG tablet  Commonly known as:  DESCOVY  Take 1 tablet by mouth daily.     hydrocortisone 5 MG tablet  Commonly known as:  CORTEF  Take 2 tablets in the morning and 1 tablet in the afternoon, no later than 6 PM     meclizine 32 MG tablet  Commonly known as:  ANTIVERT  Take 1 tablet (32 mg total) by mouth 3 (three) times daily as needed for dizziness or nausea.     methocarbamol 500 MG tablet  Commonly known as:  ROBAXIN  Take 1 tablet (500 mg total) by mouth every 6 (six) hours as needed for muscle spasms.     ondansetron 8 MG disintegrating tablet  Commonly known as:  ZOFRAN ODT  Take 1 tablet (8 mg total) by mouth every 8 (eight) hours as needed for  nausea or vomiting.     oxyCODONE-acetaminophen 5-325 MG tablet  Commonly known as:  PERCOCET/ROXICET  Take 1-2 tablets by mouth 2 (two) times daily as needed for moderate pain or severe pain.     polyethylene glycol powder powder  Commonly known as:  GLYCOLAX/MIRALAX  Take 17 g by mouth daily. Take daily or as needed to produce 1 normal bowel movement per day.        Diagnostic Studies: Dg C-arm 1-60 Min-no Report  12/14/2015  CLINICAL DATA: surgery C-ARM 1-60 MINUTES Fluoroscopy was utilized by the requesting physician.  No radiographic interpretation.   Dg Hip Unilat With Pelvis 1v Left  12/14/2015  CLINICAL DATA:  Left total hip replacement EXAM: DG HIP (WITH OR WITHOUT PELVIS) 1V*L* ; DG C-ARM 1-60 MIN-NO REPORT COMPARISON:  07/03/2015 FINDINGS: Changes of left total hip replacement. Normal AP alignment. No hardware or bony complicating feature. IMPRESSION: Left hip replacement.  No complicating feature. Electronically Signed   By: Rolm Baptise M.D.   On: 12/14/2015 11:26   Dg Hip Port Unilat With Pelvis 1v Left  12/14/2015  CLINICAL DATA:  Postop left total hip replacement EXAM: DG HIP (WITH OR WITHOUT PELVIS) 1V PORT LEFT COMPARISON:  12/14/2015 FINDINGS: Changes of left total hip replacement. Normal alignment. No hardware or bony complicating feature. IMPRESSION: Left hip replacement.  No complicating feature. Electronically Signed   By: Rolm Baptise M.D.   On: 12/14/2015 11:56    Disposition: 01-Home or Self Care        Follow-up Information    Follow up with [facility].   Why:  Home Health Physical Therapy   Contact information:   [address] [name] [address] [phone]       Follow up with Mcarthur Rossetti, MD. Schedule an appointment as soon as possible for a visit in 2 weeks.   Specialty:  Orthopedic Surgery   Contact information:   [place] [address] [phone]        Signed: Erskine Emery 12/16/2015, 2:04 PM

## 2015-12-16 NOTE — Progress Notes (Signed)
Pt dc to home; given prescriptions and dc paperwork prior to dc. Pt escorted to lobby via wheelchair by Nursing Tech

## 2015-12-16 NOTE — Discharge Instructions (Signed)

## 2015-12-16 NOTE — Progress Notes (Signed)
Physical Therapy Treatment Patient Details Name: Lenore Schmude MRN: [id number] DOB: [birth year] Today's Date: 12/16/2015    History of Present Illness s/p L THR; hx AVN bil hips, TB, HIV and chronic back pain    PT Comments    Pt progressing well with mobility and eager for dc home.  Reviewed stairs, therex and car transfers.  Follow Up Recommendations  Home health PT     Equipment Recommendations  Rolling walker with 5" wheels    Recommendations for Other Services OT consult     Precautions / Restrictions Precautions Precautions: Fall Restrictions Weight Bearing Restrictions: No Other Position/Activity Restrictions: WBAT    Mobility  Bed Mobility Overal bed mobility: Needs Assistance Bed Mobility: Supine to Sit     Supine to sit: Supervision        Transfers Overall transfer level: Needs assistance Equipment used: Rolling walker (2 wheeled) Transfers: Sit to/from Stand Sit to Stand: Supervision         General transfer comment: min cues for use of UEs to self assist  Ambulation/Gait Ambulation/Gait assistance: Min guard;Supervision Ambulation Distance (Feet): 400 Feet Assistive device: Rolling walker (2 wheeled) Gait Pattern/deviations: Step-through pattern;Decreased step length - right;Decreased step length - left;Shuffle;Trunk flexed Gait velocity: decr   General Gait Details: cues for posture, position from RW and initial sequence   Stairs Stairs: Yes Stairs assistance: Min guard Stair Management: One rail Left;Step to pattern;Forwards;With crutches Number of Stairs: 4 General stair comments: min cues for sequence and foot/crutch placement  Wheelchair Mobility    Modified Rankin (Stroke Patients Only)       Balance                                    Cognition Arousal/Alertness: Awake/alert Behavior During Therapy: WFL for tasks assessed/performed Overall Cognitive Status: Within Functional Limits for tasks assessed                      Exercises Total Joint Exercises Ankle Circles/Pumps: AROM;Both;15 reps;Supine Quad Sets: AROM;Both;10 reps;Supine Gluteal Sets: AROM;Both;10 reps;Supine Heel Slides: AAROM;Left;20 reps;Supine Hip ABduction/ADduction: AAROM;Left;15 reps;Supine Long Arc Quad: AROM;Left;10 reps;Seated    General Comments        Pertinent Vitals/Pain Pain Assessment: 0-10 Pain Score: 3  Pain Location: L hip/thigh Pain Descriptors / Indicators: Aching;Sore Pain Intervention(s): Limited activity within patient's tolerance;Monitored during session;Premedicated before session;Ice applied    Home Living                      Prior Function            PT Goals (current goals can now be found in the care plan section) Acute Rehab PT Goals Patient Stated Goal: Walk with less pain PT Goal Formulation: With patient Time For Goal Achievement: 12/19/15 Potential to Achieve Goals: Good Progress towards PT goals: Progressing toward goals    Frequency  7X/week    PT Plan Current plan remains appropriate    Co-evaluation             End of Session Equipment Utilized During Treatment: Gait belt Activity Tolerance: Patient tolerated treatment well Patient left: in chair;with call bell/phone within reach     Time: 0912-0950 PT Time Calculation (min) (ACUTE ONLY): 38 min  Charges:  $Gait Training: 8-22 mins $Therapeutic Exercise: 8-22 mins $Therapeutic Activity: 8-22 mins  G Codes:      Nayali Talerico 21-Dec-2015, 12:16 PM

## 2015-12-16 NOTE — Progress Notes (Signed)
Subjective: 2 Days Post-Op Procedure(s) (LRB): LEFT TOTAL HIP ARTHROPLASTY ANTERIOR APPROACH (Left) Patient reports pain as mild.    Objective: Vital signs in last 24 hours: Temp:  [98.5 F (36.9 C)-98.7 F (37.1 C)] 98.5 F (36.9 C) (01/01 1335) Pulse Rate:  [80-90] 90 (01/01 1335) Resp:  [16-18] 18 (01/01 1335) BP: (119-134)/(72-85) 134/85 mmHg (01/01 1335) SpO2:  [100 %] 100 % (01/01 1335)  Intake/Output from previous day: 12/31 0701 - 01/01 0700 In: 1080 [P.O.:1080] Out: 1125 [Urine:1125] Intake/Output this shift: Total I/O In: 480 [P.O.:480] Out: -    Recent Labs  12/15/15 0447  HGB 10.2*    Recent Labs  12/15/15 0447  WBC 13.9*  RBC 3.21*  HCT 29.9*  PLT 275    Recent Labs  12/15/15 0447  NA 136  K 4.3  CL 107  CO2 21*  BUN 11  CREATININE 0.56  GLUCOSE 137*  CALCIUM 8.8*   No results for input(s): LABPT, INR in the last 72 hours.  Neurovascular intact Dorsiflexion/Plantar flexion intact Incision: scant drainage Compartment soft  Assessment/Plan: 2 Days Post-Op Procedure(s) (LRB): LEFT TOTAL HIP ARTHROPLASTY ANTERIOR APPROACH (Left) Discharge home with home health  Erskine Emery 12/16/2015, 2:13 PM

## 2016-01-10 ENCOUNTER — Ambulatory Visit (INDEPENDENT_AMBULATORY_CARE_PROVIDER_SITE_OTHER): Payer: Self-pay | Admitting: Internal Medicine

## 2016-01-10 ENCOUNTER — Encounter: Payer: Self-pay | Admitting: Internal Medicine

## 2016-01-10 VITALS — BP 127/83 | HR 109 | Temp 100.0°F | Wt 151.8 lb

## 2016-01-10 DIAGNOSIS — B20 Human immunodeficiency virus [HIV] disease: Secondary | ICD-10-CM

## 2016-01-10 LAB — CBC WITH DIFFERENTIAL/PLATELET
Basophils Absolute: 0 10*3/uL (ref 0.0–0.1)
Basophils Relative: 0 % (ref 0–1)
Eosinophils Absolute: 0.1 10*3/uL (ref 0.0–0.7)
Eosinophils Relative: 1 % (ref 0–5)
HCT: 33.4 % — ABNORMAL LOW (ref 36.0–46.0)
Hemoglobin: 11.1 g/dL — ABNORMAL LOW (ref 12.0–15.0)
Lymphocytes Relative: 22 % (ref 12–46)
Lymphs Abs: 1.7 10*3/uL (ref 0.7–4.0)
MCH: 31 pg (ref 26.0–34.0)
MCHC: 33.2 g/dL (ref 30.0–36.0)
MCV: 93.3 fL (ref 78.0–100.0)
MPV: 9.6 fL (ref 8.6–12.4)
Monocytes Absolute: 1 10*3/uL (ref 0.1–1.0)
Monocytes Relative: 13 % — ABNORMAL HIGH (ref 3–12)
Neutro Abs: 4.9 10*3/uL (ref 1.7–7.7)
Neutrophils Relative %: 64 % (ref 43–77)
Platelets: 357 10*3/uL (ref 150–400)
RBC: 3.58 MIL/uL — ABNORMAL LOW (ref 3.87–5.11)
RDW: 14.7 % (ref 11.5–15.5)
WBC: 7.6 10*3/uL (ref 4.0–10.5)

## 2016-01-10 LAB — COMPLETE METABOLIC PANEL WITH GFR
ALT: 7 U/L (ref 6–29)
AST: 11 U/L (ref 10–30)
Albumin: 4.1 g/dL (ref 3.6–5.1)
Alkaline Phosphatase: 118 U/L — ABNORMAL HIGH (ref 33–115)
BUN: 7 mg/dL (ref 7–25)
CO2: 23 mmol/L (ref 20–31)
Calcium: 9.4 mg/dL (ref 8.6–10.2)
Chloride: 103 mmol/L (ref 98–110)
Creat: 0.82 mg/dL (ref 0.50–1.10)
GFR, Est African American: >89 mL/min (ref >=60)
GFR, Est Non African American: >89 mL/min (ref >=60)
Glucose, Bld: 75 mg/dL (ref 65–99)
Potassium: 3.9 mmol/L (ref 3.5–5.3)
Sodium: 138 mmol/L (ref 135–146)
Total Bilirubin: 0.4 mg/dL (ref 0.2–1.2)
Total Protein: 7.7 g/dL (ref 6.1–8.1)

## 2016-01-10 NOTE — Progress Notes (Signed)
Patient ID: Kelsey Rodriguez, female   DOB: 31-May-1991, 25 y.o.   MRN: [id number]       Patient ID: Kelsey Rodriguez, female   DOB: October 30, 1991, 25 y.o.   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 She underwent L THA for L hip AVN in late December. She is nearly 4 wk out from her surgery. Doing well with ambulation, using walker. She has noticed a few days of myalgias, chills, coldlike symptoms. Denies fever. No sick contacts with flu like illness.  Outpatient Encounter Prescriptions as of 01/10/2016  Medication Sig  . darunavir-cobicistat (PREZCOBIX) 800-150 MG tablet Take 1 tablet by mouth daily. Swallow whole. Do NOT crush, break or chew tablets. Take with food.  [name] emtricitabine-tenofovir AF (DESCOVY) 200-25 MG tablet Take 1 tablet by mouth daily.  . hydrocortisone (CORTEF) 5 MG tablet Take 2 tablets in the morning and 1 tablet in the afternoon, no later than 6 PM (Patient taking differently: Take 5-10 mg by mouth 2 (two) times daily. Take 2 tablets in the morning and 1 tablet in the afternoon, no later than 6 PM)  . polyethylene glycol powder (GLYCOLAX/MIRALAX) powder Take 17 g by mouth daily. Take daily or as needed to produce 1 normal bowel movement per day. (Patient taking differently: Take 17 g by mouth daily as needed for mild constipation, moderate constipation or severe constipation. Take daily or as needed to produce 1 normal bowel movement per day.)  . [DISCONTINUED] methocarbamol (ROBAXIN) 500 MG tablet Take 1 tablet (500 mg total) by mouth every 6 (six) hours as needed for muscle spasms.  [name] aspirin EC 325 MG EC tablet Take 1 tablet (325 mg total) by mouth 2 (two) times daily after a meal. (Patient not taking: Reported on 01/10/2016)  . meclizine (ANTIVERT) 32 MG tablet Take 1 tablet (32 mg total) by mouth 3 (three) times daily as needed for dizziness or nausea. (Patient not taking: Reported on 01/10/2016)  . ondansetron (ZOFRAN ODT) 8 MG disintegrating tablet  Take 1 tablet (8 mg total) by mouth every 8 (eight) hours as needed for nausea or vomiting. (Patient not taking: Reported on 01/10/2016)  . oxyCODONE-acetaminophen (PERCOCET/ROXICET) 5-325 MG tablet Take 1-2 tablets by mouth 2 (two) times daily as needed for moderate pain or severe pain.   No facility-administered encounter medications on file as of 01/10/2016.     Patient Active Problem List   Diagnosis Date Noted  . Avascular necrosis of bone of left hip ([place]) 12/14/2015  . 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 Osteonecrosis of both hips ([place]) 06/28/2015  . Nausea with vomiting 05/18/2015  . Overweight (BMI 25.0-29.9) 04/20/2015  . Seasonal allergies 03/20/2015  . Menorrhagia 03/19/2015  . Back pain of lumbar region with sciatica 02/12/2015  . Dizziness and giddiness 01/23/2015  . Primary adrenal insufficiency ([place]) 01/03/2015  . Fatigue 09/25/2014  . Chest pain 07/07/2013  . Laceration of ankle, right 11/18/2012  . 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 Vaginal discharge 05/30/2012  . Arthralgia 05/20/2012  . HIV (human immunodeficiency virus infection) ([place]) 03/16/2012  . Dysphagia 03/11/2012  . 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 Iron deficiency anemia 03/11/2012  . Reflux esophagitis 03/11/2012   Review of Systems 10 point ros is negative, except what is mentioned in hpi. Physical Exam   BP 127/83 mmHg  Pulse 109  Temp(Src) 100 F (37.8 C) (Oral)  Wt 151 lb 12 oz (68.833 kg)  LMP  12/22/2015 Physical Exam  Constitutional:  oriented to person, place, and time. appears well-developed and well-nourished. No distress.  HENT: [place]/AT, PERRLA, no scleral icterus Mouth/Throat: Oropharynx is clear and moist. No oropharyngeal exudate.  Cardiovascular: Normal rate, regular rhythm and normal heart sounds. Exam reveals no gallop and no friction rub.  No murmur heard.  Pulmonary/Chest: Effort normal and breath sounds normal. No respiratory distress.  has no  wheezes.  Neck = supple, no nuchal rigidity Abdominal: Soft. Bowel sounds are normal.  exhibits no distension. There is no tenderness.  Lymphadenopathy: no cervical adenopathy. No axillary adenopathy Neurological: alert and oriented to person, place, and time.  Skin: Skin is warm and dry. C/d/i incision on hip Psychiatric: a normal mood and affect.  behavior is normal.   Lab Results  Component Value Date   CD4TCELL 25* 09/13/2015   Lab Results  Component Value Date   CD4TABS 620 09/13/2015   CD4TABS 460 03/21/2015   CD4TABS 650 01/23/2015   Lab Results  Component Value Date   HIV1RNAQUANT <20 09/13/2015   Lab Results  Component Value Date   HEPBSAB POS* 09/13/2015   No results found for: RPR  CBC Lab Results  Component Value Date   WBC 13.9* 12/15/2015   RBC 3.21* 12/15/2015   HGB 10.2* 12/15/2015   HCT 29.9* 12/15/2015   PLT 275 12/15/2015   MCV 93.1 12/15/2015   MCH 31.8 12/15/2015   MCHC 34.1 12/15/2015   RDW 13.2 12/15/2015   LYMPHSABS 1.4 09/14/2015   MONOABS 0.5 09/14/2015   EOSABS 0.1 09/14/2015   BASOSABS 0.0 09/14/2015   BMET Lab Results  Component Value Date   NA 136 12/15/2015   K 4.3 12/15/2015   CL 107 12/15/2015   CO2 21* 12/15/2015   GLUCOSE 137* 12/15/2015   BUN 11 12/15/2015   CREATININE 0.56 12/15/2015   CALCIUM 8.8* 12/15/2015   GFRNONAA >60 12/15/2015   GFRAA >60 12/15/2015     Assessment and Plan   Viral like illness (ILI) = patient on day 3 of symptoms. Having fever, myalgia, HA, sore throat, but not any cough. Continue with supportive care. On the cusp to see whether antivirals such as oseltamivir can reduce symptoms. OOC for a course of oseltamivir is $100 which is cost prohibitive for the patient. For now recommend to use ibuprofen and tylenol alternating for fevers, will check to see how she is doing next week.  Left THA = surgical incision site is c/d/i. No signs of infection. Continue with dr. Trevor Mace instructions  hiv  disease = well controlled, will get labs today

## 2016-01-11 LAB — T-HELPER CELL (CD4) - (RCID CLINIC ONLY)
CD4 % Helper T Cell: 30 % — ABNORMAL LOW (ref 33–55)
CD4 T Cell Abs: 460 /uL (ref 400–2700)

## 2016-01-11 LAB — HIV-1 RNA QUANT-NO REFLEX-BLD
HIV 1 RNA Quant: 35 copies/mL — ABNORMAL HIGH (ref <20)
HIV-1 RNA Quant, Log: 1.54 Log copies/mL — ABNORMAL HIGH (ref <1.30)

## 2016-01-17 ENCOUNTER — Encounter: Payer: Self-pay | Admitting: Internal Medicine

## 2016-02-16 ENCOUNTER — Encounter: Payer: Self-pay | Admitting: Internal Medicine

## 2016-02-18 ENCOUNTER — Telehealth: Payer: Self-pay | Admitting: *Deleted

## 2016-02-18 NOTE — Telephone Encounter (Signed)
It looks like she said that she had missed some doses of Prezcobix due to pill size. She was on DRV/r/TRV in the past. Due to iffy adherence in the past, can we used Genvoya + DRV. The pill size would be a little smaller.

## 2016-02-18 NOTE — Telephone Encounter (Signed)
Please advise: I am requesting if there could be a change in my meds. I wish the Prezcobix could be change to a smaller pill. It is too big for me to swallow and i don't like breaking pills. I would be glad if there is another i could take in place of it. I have missed some few doses of Prezcobix.    Thanks and hope i can get a change soon as possible.

## 2016-02-18 NOTE — Telephone Encounter (Signed)
Let's switch her to genvoya 1 tab daily plus darunavir 800mg  1 tab daily. Tell her to try to take it with apple sauce to ease swallowing large pills. Discontinue her last regimen

## 2016-02-19 ENCOUNTER — Other Ambulatory Visit: Payer: Self-pay | Admitting: Pharmacist Clinician (PhC)/ Clinical Pharmacy Specialist

## 2016-02-19 ENCOUNTER — Telehealth: Payer: Self-pay | Admitting: Pharmacist Clinician (PhC)/ Clinical Pharmacy Specialist

## 2016-02-19 MED ORDER — ELVITEG-COBIC-EMTRICIT-TENOFAF 150-150-200-10 MG PO TABS: 1.0000 | ORAL_TABLET | Freq: Every day | ORAL | Status: DC

## 2016-02-19 MED ORDER — DARUNAVIR ETHANOLATE 800 MG PO TABS: 800.0000 mg | ORAL_TABLET | Freq: Every day | ORAL | Status: DC

## 2016-02-19 NOTE — Telephone Encounter (Signed)
Kelsey Rodriguez has had a lot of issue with taking the Prezcobix due to its size. Due to her iffy adherence in the past we will use Genvoya + DRV and Dr. Baxter Flattery is ok with the plan. Spoke to her today and she will pick up the new regimen today and stop her current regimen.

## 2016-02-25 ENCOUNTER — Other Ambulatory Visit: Payer: Self-pay

## 2016-02-25 ENCOUNTER — Ambulatory Visit (INDEPENDENT_AMBULATORY_CARE_PROVIDER_SITE_OTHER): Payer: Self-pay | Admitting: *Deleted

## 2016-02-25 ENCOUNTER — Ambulatory Visit: Payer: Self-pay

## 2016-02-25 DIAGNOSIS — Z308 Encounter for other contraceptive management: Secondary | ICD-10-CM

## 2016-02-25 LAB — POCT URINE PREGNANCY: Preg Test, Ur: NEGATIVE

## 2016-02-25 MED ORDER — MEDROXYPROGESTERONE ACETATE 150 MG/ML IM SUSP
150.0000 mg | Freq: Once | INTRAMUSCULAR | Status: AC
  Administered 2016-02-25: 150 mg via INTRAMUSCULAR

## 2016-02-25 NOTE — Patient Instructions (Signed)
Make return appointment for 12 weeks.

## 2016-03-13 ENCOUNTER — Ambulatory Visit: Payer: Self-pay

## 2016-03-18 ENCOUNTER — Encounter: Payer: Self-pay | Admitting: Internal Medicine

## 2016-03-21 ENCOUNTER — Other Ambulatory Visit: Payer: Self-pay | Admitting: Orthopaedic Surgery

## 2016-03-21 NOTE — Patient Instructions (Addendum)
Kelsey Rodriguez  03/21/2016   Your procedure is scheduled on: 04/04/16   Report to [facility] Main  Entrance take [place]  elevators to 3rd floor to  [place] at    0750 AM.  Call this number if you have problems the morning of surgery [phone]   Remember: ONLY 1 PERSON MAY GO WITH YOU TO SHORT STAY TO GET  READY MORNING OF [place].  Do not eat food or drink liquids :After Midnight.     Take these medicines the morning of surgery with A SIP OF WATER: none                                You may not have any metal on your body including hair pins and              piercings  Do not wear jewelry, make-up, lotions, powders or perfumes, deodorant             Do not wear nail polish.  Do not shave  48 hours prior to surgery.     Do not bring valuables to the hospital. [place].  Contacts, dentures or bridgework may not be worn into surgery.  Leave suitcase in the car. After surgery it may be brought to your room.      Special Instructions: coughing and deep breathing exercises, leg exercises               Please read over the following fact sheets you were given: _____________________________________________________________________             [facility] - Preparing for Surgery Before surgery, you can play an important role.  Because skin is not sterile, your skin needs to be as free of germs as possible.  You can reduce the number of germs on your skin by washing with CHG (chlorahexidine gluconate) soap before surgery.  CHG is an antiseptic cleaner which kills germs and bonds with the skin to continue killing germs even after washing. Please DO NOT use if you have an allergy to CHG or antibacterial soaps.  If your skin becomes reddened/irritated stop using the CHG and inform your nurse when you arrive at Short Stay. Do not shave (including legs and underarms) for at least 48 hours prior to the first  CHG shower.  You may shave your face/neck. Please follow these instructions carefully:  1.  Shower with CHG Soap the night before surgery and the  morning of Surgery.  2.  If you choose to wash your hair, wash your hair first as usual with your  normal  shampoo.  3.  After you shampoo, rinse your hair and body thoroughly to remove the  shampoo.                           4.  Use CHG as you would any other liquid soap.  You can apply chg directly  to the skin and wash                       Gently with a scrungie or clean washcloth.  5.  Apply the CHG Soap to your body  ONLY FROM THE NECK DOWN.   Do not use on face/ open                           Wound or open sores. Avoid contact with eyes, ears mouth and genitals (private parts).                       Wash face,  Genitals (private parts) with your normal soap.             6.  Wash thoroughly, paying special attention to the area where your surgery  will be performed.  7.  Thoroughly rinse your body with warm water from the neck down.  8.  DO NOT shower/wash with your normal soap after using and rinsing off  the CHG Soap.                9.  Pat yourself dry with a clean towel.            10.  Wear clean pajamas.            11.  Place clean sheets on your bed the night of your first shower and do not  sleep with pets. Day of Surgery : Do not apply any lotions/deodorants the morning of surgery.  Please wear clean clothes to the hospital/surgery center.  FAILURE TO FOLLOW THESE INSTRUCTIONS MAY RESULT IN THE CANCELLATION OF YOUR SURGERY PATIENT SIGNATURE_________________________________  NURSE SIGNATURE__________________________________  ________________________________________________________________________  WHAT IS A BLOOD TRANSFUSION? Blood Transfusion Information  A transfusion is the replacement of blood or some of its parts. Blood is made up of multiple cells which provide different functions.  Red blood cells carry oxygen and are used  for blood loss replacement.  White blood cells fight against infection.  Platelets control bleeding.  Plasma helps clot blood.  Other blood products are available for specialized needs, such as hemophilia or other clotting disorders. BEFORE THE TRANSFUSION  Who gives blood for transfusions?   Healthy volunteers who are fully evaluated to make sure their blood is safe. This is blood bank blood. Transfusion therapy is the safest it has ever been in the practice of medicine. Before blood is taken from a donor, a complete history is taken to make sure that person has no history of diseases nor engages in risky social behavior (examples are intravenous drug use or sexual activity with multiple partners). The donor's travel history is screened to minimize risk of transmitting infections, such as malaria. The donated blood is tested for signs of infectious diseases, such as HIV and hepatitis. The blood is then tested to be sure it is compatible with you in order to minimize the chance of a transfusion reaction. If you or a relative donates blood, this is often done in anticipation of surgery and is not appropriate for emergency situations. It takes many days to process the donated blood. RISKS AND COMPLICATIONS Although transfusion therapy is very safe and saves many lives, the main dangers of transfusion include:   Getting an infectious disease.  Developing a transfusion reaction. This is an allergic reaction to something in the blood you were given. Every precaution is taken to prevent this. The decision to have a blood transfusion has been considered carefully by your caregiver before blood is given. Blood is not given unless the benefits outweigh the risks. AFTER THE TRANSFUSION  Right after receiving a blood transfusion, you will usually feel much  better and more energetic. This is especially true if your red blood cells have gotten low (anemic). The transfusion raises the level of the red blood  cells which carry oxygen, and this usually causes an energy increase.  The nurse administering the transfusion will monitor you carefully for complications. HOME CARE INSTRUCTIONS  No special instructions are needed after a transfusion. You may find your energy is better. Speak with your caregiver about any limitations on activity for underlying diseases you may have. SEEK MEDICAL CARE IF:   Your condition is not improving after your transfusion.  You develop redness or irritation at the intravenous (IV) site. SEEK IMMEDIATE MEDICAL CARE IF:  Any of the following symptoms occur over the next 12 hours:  Shaking chills.  You have a temperature by mouth above 102 F (38.9 C), not controlled by medicine.  Chest, back, or muscle pain.  People around you feel you are not acting correctly or are confused.  Shortness of breath or difficulty breathing.  Dizziness and fainting.  You get a rash or develop hives.  You have a decrease in urine output.  Your urine turns a dark color or changes to pink, red, or brown. Any of the following symptoms occur over the next 10 days:  You have a temperature by mouth above 102 F (38.9 C), not controlled by medicine.  Shortness of breath.  Weakness after normal activity.  The white part of the eye turns yellow (jaundice).  You have a decrease in the amount of urine or are urinating less often.  Your urine turns a dark color or changes to pink, red, or brown. Document Released: 11/28/2000 Document Revised: 02/23/2012 Document Reviewed: 07/17/2008 [facility] Patient Information 2014 [place], [place].  _______________________________________________________________________

## 2016-03-24 ENCOUNTER — Encounter (HOSPITAL_COMMUNITY)
Admission: RE | Admit: 2016-03-24 | Discharge: 2016-03-24 | Disposition: A | Discharge status: Home / Self Care | Payer: Medicaid Other | Source: Ambulatory Visit | Attending: Orthopaedic Surgery | Admitting: Orthopaedic Surgery

## 2016-03-24 ENCOUNTER — Encounter (HOSPITAL_COMMUNITY): Payer: Self-pay

## 2016-03-24 DIAGNOSIS — B009 Herpesviral infection, unspecified: Secondary | ICD-10-CM | POA: Insufficient documentation

## 2016-03-24 DIAGNOSIS — B20 Human immunodeficiency virus [HIV] disease: Secondary | ICD-10-CM | POA: Diagnosis not present

## 2016-03-24 DIAGNOSIS — A159 Respiratory tuberculosis unspecified: Secondary | ICD-10-CM | POA: Insufficient documentation

## 2016-03-24 DIAGNOSIS — Z96649 Presence of unspecified artificial hip joint: Secondary | ICD-10-CM | POA: Diagnosis not present

## 2016-03-24 DIAGNOSIS — M87851 Other osteonecrosis, right femur: Secondary | ICD-10-CM | POA: Insufficient documentation

## 2016-03-24 DIAGNOSIS — M549 Dorsalgia, unspecified: Secondary | ICD-10-CM | POA: Diagnosis not present

## 2016-03-24 DIAGNOSIS — K210 Gastro-esophageal reflux disease with esophagitis: Secondary | ICD-10-CM | POA: Diagnosis not present

## 2016-03-24 DIAGNOSIS — G8929 Other chronic pain: Secondary | ICD-10-CM | POA: Diagnosis not present

## 2016-03-24 DIAGNOSIS — R102 Pelvic and perineal pain: Secondary | ICD-10-CM | POA: Insufficient documentation

## 2016-03-24 DIAGNOSIS — Z01812 Encounter for preprocedural laboratory examination: Secondary | ICD-10-CM | POA: Insufficient documentation

## 2016-03-24 DIAGNOSIS — M5416 Radiculopathy, lumbar region: Secondary | ICD-10-CM | POA: Insufficient documentation

## 2016-03-24 LAB — BASIC METABOLIC PANEL
Anion gap: 9 (ref 5–15)
BUN: 13 mg/dL (ref 6–20)
CO2: 20 mmol/L — ABNORMAL LOW (ref 22–32)
Calcium: 9.5 mg/dL (ref 8.9–10.3)
Chloride: 108 mmol/L (ref 101–111)
Creatinine, Ser: 0.79 mg/dL (ref 0.44–1.00)
GFR calc Af Amer: >60 mL/min (ref >60)
GFR calc non Af Amer: >60 mL/min (ref >60)
Glucose, Bld: 96 mg/dL (ref 65–99)
Potassium: 4.3 mmol/L (ref 3.5–5.1)
Sodium: 137 mmol/L (ref 135–145)

## 2016-03-24 LAB — TYPE AND SCREEN
ABO/RH(D): O POS
Antibody Screen: NEGATIVE

## 2016-03-24 LAB — CBC
HCT: 32.9 % — ABNORMAL LOW (ref 36.0–46.0)
Hemoglobin: 10.9 g/dL — ABNORMAL LOW (ref 12.0–15.0)
MCH: 28.7 pg (ref 26.0–34.0)
MCHC: 33.1 g/dL (ref 30.0–36.0)
MCV: 86.6 fL (ref 78.0–100.0)
Platelets: 350 10*3/uL (ref 150–400)
RBC: 3.8 MIL/uL — ABNORMAL LOW (ref 3.87–5.11)
RDW: 16.5 % — ABNORMAL HIGH (ref 11.5–15.5)
WBC: 7.2 10*3/uL (ref 4.0–10.5)

## 2016-03-24 LAB — HCG, SERUM, QUALITATIVE: Preg, Serum: NEGATIVE

## 2016-03-24 LAB — SURGICAL PCR SCREEN
MRSA, PCR: NEGATIVE
Staphylococcus aureus: NEGATIVE

## 2016-03-24 NOTE — Progress Notes (Signed)
10/08/14- [place]  2V CXR- 11/02/2015- EPIC  01/10/16- LOV- INF DIS- EPIC

## 2016-03-24 NOTE — Progress Notes (Signed)
CBC done 03/24/16 faxed via EPIC to Dr Zollie Beckers

## 2016-03-27 ENCOUNTER — Ambulatory Visit: Payer: Self-pay

## 2016-03-27 ENCOUNTER — Encounter: Payer: Self-pay | Admitting: Internal Medicine

## 2016-03-31 ENCOUNTER — Ambulatory Visit (INDEPENDENT_AMBULATORY_CARE_PROVIDER_SITE_OTHER): Payer: Self-pay | Admitting: Family Medicine

## 2016-03-31 ENCOUNTER — Encounter: Payer: Self-pay | Admitting: Family Medicine

## 2016-03-31 VITALS — BP 110/60 | HR 76 | Temp 98.3°F | Ht 64.0 in | Wt 157.0 lb

## 2016-03-31 DIAGNOSIS — E271 Primary adrenocortical insufficiency: Secondary | ICD-10-CM

## 2016-03-31 DIAGNOSIS — M879 Osteonecrosis, unspecified: Secondary | ICD-10-CM

## 2016-03-31 DIAGNOSIS — R0789 Other chest pain: Secondary | ICD-10-CM

## 2016-03-31 MED ORDER — OXYCODONE-ACETAMINOPHEN 5-325 MG PO TABS: 1.0000 | ORAL_TABLET | Freq: Two times a day (BID) | ORAL | Status: DC | PRN

## 2016-03-31 NOTE — Assessment & Plan Note (Signed)
Pt not taking hydrocortisone because she doesn't like taking medications, because her endocrinologist told her that her labs were "normal," and because her surgeon told her this made her bones breakdown. We spent a dignificant portion of today's visit discussing the requirement for her to take this medication, especially around time of illness/surgery. She reports she will restart it.

## 2016-03-31 NOTE — Assessment & Plan Note (Signed)
Short term refill provided prior to surgery of right hip.

## 2016-03-31 NOTE — Assessment & Plan Note (Signed)
Possibly s/p left lung Bx. Doubt cardiopulmonary cause, possibly costochondritis, though could be nerve damage from Bx. Not primary focus of visit, will readdress at later appt.

## 2016-03-31 NOTE — Progress Notes (Signed)
Subjective: Kelsey Rodriguez is a 25 y.o. female with a history of HIV on ART and primary adrenal insufficiency on chronic steroids presenting for surgery follow up.  She has developed AVN of both hips. She's had left total hip arthroplasty 12/14/2015 by Dr. Ninfa Linden, and has the same procedure planned for the right hip on 04/04/2016. She is out of pain medications and requests a short term refill prior to surgery. Her pain is on the right hip, radiating and at times feels referred entirely to the right knee, improved by non weight bearing, oral analgesics. No fever, chills, back pain.   She also, somewhat sheepishly, endorses not taking hydrocortisone for the past several months. She is very aware that this is described as a mandatory medication per her endocrinologist. She endorses vague abdominal pain, nausea, occasional emesis. No headaches, vision or hearing changes, unintentional weight loss or gain, orthostasis, or swelling.   She also endorses left upper chest wall pain that began shortly after a lung biopsy in that area several years ago. It comes and goes, , at times is severe, improved with oral analgesics, involves the left upper chest, radiating "aching" sensation to the left shoulder and left neck. It's usually not present, but provoked by pushing or picking things up with her left arm (she's right-handed), but also with deep breathing or laying on that side. She denies trouble breathing, cough, hemoptysis, neck pain.   - ROS: As above - Non-smoker  Objective: BP 110/60 mmHg  Pulse 76  Temp(Src) 98.3 F (36.8 C) (Oral)  Ht 5\' 4"  (1.626 m)  Wt 157 lb (71.215 kg)  BMI 26.94 kg/m2  SpO2 99% Gen: Well-appearing 25 y.o. female in no distress CV: Regular rate, no murmur; radial, DP and PT pulses 2+ bilaterally; no LE edema, no JVD, cap refill < 2 sec. - 6cm linear transverse surgical scar over left chest well healed. Chest, neck and shoulder normal to inspection. No tenderness or spasm of  upper left chest wall, trapezius, or shoulder. Shoulder with full AROM without pain, negative empty can, lift-off. Negative spurlings, neck with full ROM.  Pulm: Non-labored breathing ambient air; CTAB, no wheezes or crackles Right hip: Full ROM with pain worst on internal rotation and adduction. Full strength. Pelvic alignment unremarkable to inspection and palpation. Antalgic gait without trendelenberg. Greater trochanter without tenderness to palpation. No SI joint tenderness and normal minimal SI movement.   Assessment/Plan: Kelsey Rodriguez is a 25 y.o. female here for hip and chest pain.  Primary adrenal insufficiency Pt not taking hydrocortisone because she doesn't like taking medications, because her endocrinologist told her that her labs were "normal," and because her surgeon told her this made her bones breakdown. We spent a dignificant portion of today's visit discussing the requirement for her to take this medication, especially around time of illness/surgery. She reports she will restart it.   Chest pain Possibly s/p left lung Bx. Doubt cardiopulmonary cause, possibly costochondritis, though could be nerve damage from Bx. Not primary focus of visit, will readdress at later appt.   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

## 2016-03-31 NOTE — Patient Instructions (Signed)
Thank you for coming in today!  I STRONGLY urge you to restart hydrocortisone I have refilled percocet for pain prior to your surgery I think your chest pain is probably related to the lung biopsy and may or may not get any better. There's nothing dangerous about it.   Our clinic's number is [phone]. Feel free to call any time with questions or concerns. We will answer any questions after hours with our 24-hour emergency line at that number as well.   - Dr. Bonner Puna

## 2016-04-03 ENCOUNTER — Encounter: Payer: Self-pay | Admitting: Internal Medicine

## 2016-04-03 ENCOUNTER — Ambulatory Visit (INDEPENDENT_AMBULATORY_CARE_PROVIDER_SITE_OTHER): Payer: Self-pay | Admitting: Internal Medicine

## 2016-04-03 VITALS — BP 120/85 | HR 75 | Temp 98.3°F | Wt 158.0 lb

## 2016-04-03 DIAGNOSIS — B20 Human immunodeficiency virus [HIV] disease: Secondary | ICD-10-CM

## 2016-04-03 DIAGNOSIS — M8700 Idiopathic aseptic necrosis of unspecified bone: Secondary | ICD-10-CM

## 2016-04-03 DIAGNOSIS — N898 Other specified noninflammatory disorders of vagina: Secondary | ICD-10-CM

## 2016-04-03 NOTE — Progress Notes (Signed)
Subjective:    Patient ID: Kelsey Rodriguez, female    DOB: [birth year], 25 y.o.   MRN: [id number]  HPI Kelsey Rodriguez is a 25 yo F with hiv disease, initially diagnosed with pulmonary mTB. She is well controlled with CD 4 count of 460/VL 35, currently on genvoya-DRV. She has upcoming THR for avascular necrosis. Has had previous hip surgery which was well tolerated. She is here with her boyfriend who is willing to get tested. We discussed importance of using condoms and how to maintain hiv negative status if in discordant relationship  Allergies  Allergen Reactions  . Hydrocodone Itching and Nausea Only    Tolerates Oxycodone  . Tramadol Itching and Nausea Only    Tolerates oxycodone   Current Outpatient Prescriptions on File Prior to Visit  Medication Sig Dispense Refill  . acetaminophen (TYLENOL) 500 MG tablet Take 500-1,000 mg by mouth every 6 (six) hours as needed for mild pain or headache.    . Darunavir Ethanolate (PREZISTA) 800 MG tablet Take 1 tablet (800 mg total) by mouth daily with breakfast. 30 tablet 11  . elvitegravir-cobicistat-emtricitabine-tenofovir (GENVOYA) 150-150-200-10 MG TABS tablet Take 1 tablet by mouth daily with breakfast. 30 tablet 11  . hydrocortisone (CORTEF) 5 MG tablet Take 2 tablets in the morning and 1 tablet in the afternoon, no later than 6 PM 300 tablet 3  . aspirin EC 325 MG EC tablet Take 1 tablet (325 mg total) by mouth 2 (two) times daily after a meal. 30 tablet 0  . methocarbamol (ROBAXIN) 500 MG tablet Take 1 tablet (500 mg total) by mouth every 6 (six) hours as needed for muscle spasms. 60 tablet 0  . oxyCODONE-acetaminophen (PERCOCET/ROXICET) 5-325 MG tablet Take 1-2 tablets by mouth 2 (two) times daily as needed for moderate pain or severe pain. 60 tablet 0   No current facility-administered medications on file prior to visit.   Active Ambulatory Problems    Diagnosis Date Noted  . Dysphagia 03/11/2012  . 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 Iron deficiency anemia 03/11/2012  . Reflux esophagitis 03/11/2012  . HIV (human immunodeficiency virus infection) ([place]) 03/16/2012  . Arthralgia 05/20/2012  . 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 Vaginal discharge 05/30/2012  . Laceration of ankle, right 11/18/2012  . Chest pain 07/07/2013  . Fatigue 09/25/2014  . Primary adrenal insufficiency ([place]) 01/03/2015  . Dizziness and giddiness 01/23/2015  . Back pain of lumbar region with sciatica 02/12/2015  . Menorrhagia 03/19/2015  . Seasonal allergies 03/20/2015  . Overweight (BMI 25.0-29.9) 04/20/2015  . Nausea with vomiting 05/18/2015  . Osteonecrosis of both hips ([place]) 06/28/2015  . Avascular necrosis of bone of left hip ([place]) 12/14/2015  . 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 Avascular necrosis of bone of right hip ([place]) 04/04/2016  . 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   Resolved Ambulatory Problems    Diagnosis Date Noted  . Chest pain 03/11/2012  . 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 Muscle strain of chest wall 05/30/2013  . 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 Bell's palsy 08/26/2013   Past Medical History  Diagnosis Date  . Tuberculosis   . Pelvic pain   . Anemia of chronic disease 03/11/2012  . Vertigo   . Chronic back pain   . Lumbar radiculopathy   . Chronic leg pain   . Adrenal insufficiency ([facility])       Review of Systems + hip pain. Otherwise 10 point ros is negative  Objective:   Physical Exam BP 120/85 mmHg  Pulse 75  Temp(Src) 98.3 F (36.8 C) (Oral)  Wt 158 lb (71.668 kg) Physical Exam  Constitutional:  oriented to person, place, and time. appears well-developed and well-nourished. No distress.  HENT: [place]/AT, PERRLA, no scleral icterus Mouth/Throat: Oropharynx is clear and moist. No oropharyngeal exudate.  Cardiovascular: Normal rate, regular rhythm and normal heart sounds. Exam reveals no gallop and no friction rub.  No murmur heard.  Pulmonary/Chest:  Effort normal and breath sounds normal. No respiratory distress.  has no wheezes.  Neck = supple, no nuchal rigidity Abdominal: Soft. Bowel sounds are normal.  exhibits no distension. There is no tenderness.  Lymphadenopathy: no cervical adenopathy. No axillary adenopathy Neurological: alert and oriented to person, place, and time.  Skin: Skin is warm and dry. No rash noted. No erythema.  Psychiatric: a normal mood and affect.  behavior is normal.    Labs: Lab Results  Component Value Date   CD4TCELL 30* 01/10/2016   CD4TABS 460 01/10/2016   Lab Results  Component Value Date   HIV1RNAQUANT 35* 01/10/2016       Assessment & Plan:  hiv disease = well controlled. It does not preclude her from surgery. Continue with genvoya-DRV  Avascular necrosis = undergoing hip replacement  Discordant relationship = recommend partner testing and using condoms presently.  Vaginal discharge= has recent pap showing gardenella. Can treat with metronidazole 500mg  bid x 7d

## 2016-04-04 ENCOUNTER — Inpatient Hospital Stay (HOSPITAL_COMMUNITY): Payer: Self-pay | Admitting: Certified Registered"

## 2016-04-04 ENCOUNTER — Inpatient Hospital Stay (HOSPITAL_COMMUNITY): Payer: Self-pay

## 2016-04-04 ENCOUNTER — Inpatient Hospital Stay (HOSPITAL_COMMUNITY)
Admission: RE | Admit: 2016-04-04 | Discharge: 2016-04-07 | DRG: 470 | Disposition: A | Discharge status: Home Health | Payer: Self-pay | Source: Ambulatory Visit | Attending: Orthopaedic Surgery | Admitting: Orthopaedic Surgery

## 2016-04-04 ENCOUNTER — Encounter (HOSPITAL_COMMUNITY): Payer: Self-pay | Admitting: *Deleted

## 2016-04-04 ENCOUNTER — Encounter (HOSPITAL_COMMUNITY)
Admission: RE | Disposition: A | Discharge status: Home Health | Payer: Self-pay | Source: Ambulatory Visit | Attending: Orthopaedic Surgery

## 2016-04-04 DIAGNOSIS — M21371 Foot drop, right foot: Secondary | ICD-10-CM | POA: Diagnosis not present

## 2016-04-04 DIAGNOSIS — E663 Overweight: Secondary | ICD-10-CM | POA: Diagnosis present

## 2016-04-04 DIAGNOSIS — M87851 Other osteonecrosis, right femur: Principal | ICD-10-CM | POA: Diagnosis present

## 2016-04-04 DIAGNOSIS — Z96642 Presence of left artificial hip joint: Secondary | ICD-10-CM | POA: Diagnosis present

## 2016-04-04 DIAGNOSIS — M87051 Idiopathic aseptic necrosis of right femur: Secondary | ICD-10-CM

## 2016-04-04 DIAGNOSIS — E271 Primary adrenocortical insufficiency: Secondary | ICD-10-CM | POA: Diagnosis present

## 2016-04-04 DIAGNOSIS — Z96641 Presence of right artificial hip joint: Secondary | ICD-10-CM

## 2016-04-04 DIAGNOSIS — Z8611 Personal history of tuberculosis: Secondary | ICD-10-CM

## 2016-04-04 DIAGNOSIS — Z419 Encounter for procedure for purposes other than remedying health state, unspecified: Secondary | ICD-10-CM

## 2016-04-04 DIAGNOSIS — Z6826 Body mass index (BMI) 26.0-26.9, adult: Secondary | ICD-10-CM

## 2016-04-04 DIAGNOSIS — Z21 Asymptomatic human immunodeficiency virus [HIV] infection status: Secondary | ICD-10-CM | POA: Diagnosis present

## 2016-04-04 HISTORY — PX: TOTAL HIP ARTHROPLASTY: SHX124

## 2016-04-04 HISTORY — DX: Presence of right artificial hip joint: Z96.641

## 2016-04-04 HISTORY — DX: Idiopathic aseptic necrosis of right femur: M87.051

## 2016-04-04 SURGERY — ARTHROPLASTY, HIP, TOTAL, ANTERIOR APPROACH
Anesthesia: General | Site: Hip | Laterality: Right

## 2016-04-04 MED ORDER — DOCUSATE SODIUM 100 MG PO CAPS
100.0000 mg | ORAL_CAPSULE | Freq: Two times a day (BID) | ORAL | Status: DC
  Administered 2016-04-05 – 2016-04-07 (×4): 100 mg via ORAL

## 2016-04-04 MED ORDER — METOCLOPRAMIDE HCL 10 MG PO TABS: 5.0000 mg | ORAL_TABLET | Freq: Three times a day (TID) | ORAL | Status: DC | PRN

## 2016-04-04 MED ORDER — METHOCARBAMOL 1000 MG/10ML IJ SOLN
500.0000 mg | Freq: Four times a day (QID) | INTRAVENOUS | Status: DC | PRN
  Filled 2016-04-04: qty 5

## 2016-04-04 MED ORDER — FENTANYL CITRATE (PF) 100 MCG/2ML IJ SOLN
INTRAMUSCULAR | Status: AC
  Filled 2016-04-04: qty 2

## 2016-04-04 MED ORDER — KETOROLAC TROMETHAMINE 15 MG/ML IJ SOLN
7.5000 mg | Freq: Four times a day (QID) | INTRAMUSCULAR | Status: AC
  Administered 2016-04-04 – 2016-04-05 (×3): 7.5 mg via INTRAVENOUS
  Filled 2016-04-04 (×3): qty 1

## 2016-04-04 MED ORDER — DEXAMETHASONE SODIUM PHOSPHATE 10 MG/ML IJ SOLN
INTRAMUSCULAR | Status: DC | PRN
  Administered 2016-04-04: 10 mg via INTRAVENOUS

## 2016-04-04 MED ORDER — DIPHENHYDRAMINE HCL 12.5 MG/5ML PO ELIX
12.5000 mg | ORAL_SOLUTION | ORAL | Status: DC | PRN
  Administered 2016-04-05 – 2016-04-06 (×2): 12.5 mg via ORAL
  Administered 2016-04-06: 25 mg via ORAL
  Administered 2016-04-06: 12.5 mg via ORAL
  Administered 2016-04-07: 25 mg via ORAL
  Filled 2016-04-04: qty 5
  Filled 2016-04-04: qty 10
  Filled 2016-04-04: qty 5
  Filled 2016-04-04: qty 10
  Filled 2016-04-04: qty 5

## 2016-04-04 MED ORDER — PROPOFOL 10 MG/ML IV BOLUS
INTRAVENOUS | Status: DC | PRN
  Administered 2016-04-04: 200 mg via INTRAVENOUS

## 2016-04-04 MED ORDER — MIDAZOLAM HCL 2 MG/2ML IJ SOLN
INTRAMUSCULAR | Status: AC
  Filled 2016-04-04: qty 2

## 2016-04-04 MED ORDER — ONDANSETRON HCL 4 MG PO TABS: 4.0000 mg | ORAL_TABLET | Freq: Four times a day (QID) | ORAL | Status: DC | PRN

## 2016-04-04 MED ORDER — PHENOL 1.4 % MT LIQD: 1.0000 | OROMUCOSAL | Status: DC | PRN

## 2016-04-04 MED ORDER — LACTATED RINGERS IV SOLN
INTRAVENOUS | Status: DC | PRN
  Administered 2016-04-04 (×2): via INTRAVENOUS

## 2016-04-04 MED ORDER — LIDOCAINE HCL (CARDIAC) 20 MG/ML IV SOLN
INTRAVENOUS | Status: DC | PRN
  Administered 2016-04-04: 80 mg via INTRAVENOUS

## 2016-04-04 MED ORDER — TRANEXAMIC ACID 1000 MG/10ML IV SOLN
1000.0000 mg | INTRAVENOUS | Status: AC
  Administered 2016-04-04: 1000 mg via INTRAVENOUS
  Filled 2016-04-04: qty 10

## 2016-04-04 MED ORDER — ACETAMINOPHEN 325 MG PO TABS: 650.0000 mg | ORAL_TABLET | Freq: Four times a day (QID) | ORAL | Status: DC | PRN

## 2016-04-04 MED ORDER — LIDOCAINE HCL (CARDIAC) 20 MG/ML IV SOLN
INTRAVENOUS | Status: AC
  Filled 2016-04-04: qty 5

## 2016-04-04 MED ORDER — STERILE WATER FOR IRRIGATION IR SOLN
Status: DC | PRN
  Administered 2016-04-04: 2000 mL

## 2016-04-04 MED ORDER — SUGAMMADEX SODIUM 200 MG/2ML IV SOLN
INTRAVENOUS | Status: DC | PRN
  Administered 2016-04-04: 150 mg via INTRAVENOUS

## 2016-04-04 MED ORDER — HYDROMORPHONE HCL 1 MG/ML IJ SOLN
INTRAMUSCULAR | Status: DC | PRN
  Administered 2016-04-04: .5 mg via INTRAVENOUS
  Administered 2016-04-04: 0.5 mg via INTRAVENOUS
  Administered 2016-04-04: .5 mg via INTRAVENOUS
  Administered 2016-04-04: 0.5 mg via INTRAVENOUS

## 2016-04-04 MED ORDER — ONDANSETRON HCL 4 MG/2ML IJ SOLN
INTRAMUSCULAR | Status: AC
  Filled 2016-04-04: qty 4

## 2016-04-04 MED ORDER — HYDROMORPHONE HCL 2 MG/ML IJ SOLN
INTRAMUSCULAR | Status: AC
  Filled 2016-04-04: qty 1

## 2016-04-04 MED ORDER — SODIUM CHLORIDE 0.9 % IR SOLN
Status: DC | PRN
  Administered 2016-04-04: 1000 mL

## 2016-04-04 MED ORDER — ROCURONIUM BROMIDE 100 MG/10ML IV SOLN
INTRAVENOUS | Status: DC | PRN
  Administered 2016-04-04: 60 mg via INTRAVENOUS

## 2016-04-04 MED ORDER — DARUNAVIR ETHANOLATE 800 MG PO TABS
800.0000 mg | ORAL_TABLET | Freq: Every day | ORAL | Status: DC
  Administered 2016-04-04 – 2016-04-07 (×4): 800 mg via ORAL
  Filled 2016-04-04 (×5): qty 1

## 2016-04-04 MED ORDER — ONDANSETRON HCL 4 MG/2ML IJ SOLN
4.0000 mg | Freq: Four times a day (QID) | INTRAMUSCULAR | Status: DC | PRN
  Administered 2016-04-04 – 2016-04-06 (×2): 4 mg via INTRAVENOUS
  Filled 2016-04-04 (×2): qty 2

## 2016-04-04 MED ORDER — ZOLPIDEM TARTRATE 5 MG PO TABS: 5.0000 mg | ORAL_TABLET | Freq: Every evening | ORAL | Status: DC | PRN

## 2016-04-04 MED ORDER — ONDANSETRON HCL 4 MG/2ML IJ SOLN
INTRAMUSCULAR | Status: DC | PRN
  Administered 2016-04-04 (×2): 2 mg via INTRAVENOUS

## 2016-04-04 MED ORDER — SUGAMMADEX SODIUM 200 MG/2ML IV SOLN
INTRAVENOUS | Status: AC
  Filled 2016-04-04: qty 2

## 2016-04-04 MED ORDER — OXYCODONE HCL 5 MG PO TABS
5.0000 mg | ORAL_TABLET | ORAL | Status: DC | PRN
  Administered 2016-04-04 – 2016-04-07 (×9): 10 mg via ORAL
  Filled 2016-04-04 (×9): qty 2

## 2016-04-04 MED ORDER — KETOROLAC TROMETHAMINE 30 MG/ML IJ SOLN
INTRAMUSCULAR | Status: AC
  Filled 2016-04-04: qty 1

## 2016-04-04 MED ORDER — MIDAZOLAM HCL 5 MG/5ML IJ SOLN
INTRAMUSCULAR | Status: DC | PRN
  Administered 2016-04-04: 2 mg via INTRAVENOUS

## 2016-04-04 MED ORDER — CEFAZOLIN SODIUM 1-5 GM-% IV SOLN
1.0000 g | Freq: Four times a day (QID) | INTRAVENOUS | Status: AC
  Administered 2016-04-04 (×2): 1 g via INTRAVENOUS
  Filled 2016-04-04 (×2): qty 50

## 2016-04-04 MED ORDER — MENTHOL 3 MG MT LOZG: 1.0000 | LOZENGE | OROMUCOSAL | Status: DC | PRN

## 2016-04-04 MED ORDER — SODIUM CHLORIDE 0.9 % IJ SOLN
INTRAMUSCULAR | Status: AC
  Filled 2016-04-04: qty 10

## 2016-04-04 MED ORDER — HYDROMORPHONE HCL 1 MG/ML IJ SOLN
0.2500 mg | INTRAMUSCULAR | Status: DC | PRN
  Administered 2016-04-04: 0.5 mg via INTRAVENOUS

## 2016-04-04 MED ORDER — PROPOFOL 10 MG/ML IV BOLUS
INTRAVENOUS | Status: AC
  Filled 2016-04-04: qty 20

## 2016-04-04 MED ORDER — METOCLOPRAMIDE HCL 5 MG/ML IJ SOLN: 5.0000 mg | Freq: Three times a day (TID) | INTRAMUSCULAR | Status: DC | PRN

## 2016-04-04 MED ORDER — ASPIRIN EC 325 MG PO TBEC
325.0000 mg | DELAYED_RELEASE_TABLET | Freq: Two times a day (BID) | ORAL | Status: DC
  Administered 2016-04-04 – 2016-04-07 (×6): 325 mg via ORAL
  Filled 2016-04-04 (×8): qty 1

## 2016-04-04 MED ORDER — METHOCARBAMOL 500 MG PO TABS
500.0000 mg | ORAL_TABLET | Freq: Four times a day (QID) | ORAL | Status: DC | PRN
  Administered 2016-04-04 – 2016-04-06 (×4): 500 mg via ORAL
  Filled 2016-04-04 (×4): qty 1

## 2016-04-04 MED ORDER — DEXAMETHASONE SODIUM PHOSPHATE 10 MG/ML IJ SOLN
INTRAMUSCULAR | Status: AC
  Filled 2016-04-04: qty 1

## 2016-04-04 MED ORDER — HYDROMORPHONE HCL 1 MG/ML IJ SOLN
INTRAMUSCULAR | Status: AC
  Filled 2016-04-04: qty 1

## 2016-04-04 MED ORDER — SODIUM CHLORIDE 0.9 % IV SOLN
INTRAVENOUS | Status: DC
  Administered 2016-04-04: 17:00:00 via INTRAVENOUS

## 2016-04-04 MED ORDER — CEFAZOLIN SODIUM-DEXTROSE 2-4 GM/100ML-% IV SOLN
INTRAVENOUS | Status: AC
  Filled 2016-04-04: qty 100

## 2016-04-04 MED ORDER — ACETAMINOPHEN 650 MG RE SUPP: 650.0000 mg | Freq: Four times a day (QID) | RECTAL | Status: DC | PRN

## 2016-04-04 MED ORDER — FENTANYL CITRATE (PF) 100 MCG/2ML IJ SOLN
INTRAMUSCULAR | Status: DC | PRN
  Administered 2016-04-04: 100 ug via INTRAVENOUS

## 2016-04-04 MED ORDER — ELVITEG-COBIC-EMTRICIT-TENOFAF 150-150-200-10 MG PO TABS
1.0000 | ORAL_TABLET | Freq: Every day | ORAL | Status: DC
  Administered 2016-04-04 – 2016-04-07 (×4): 1 via ORAL
  Filled 2016-04-04 (×5): qty 1

## 2016-04-04 MED ORDER — 0.9 % SODIUM CHLORIDE (POUR BTL) OPTIME
TOPICAL | Status: DC | PRN
Start: 2016-04-04 — End: 2016-04-04
  Administered 2016-04-04: 1000 mL

## 2016-04-04 MED ORDER — ALUM & MAG HYDROXIDE-SIMETH 200-200-20 MG/5ML PO SUSP
30.0000 mL | ORAL | Status: DC | PRN
  Administered 2016-04-06: 30 mL via ORAL
  Filled 2016-04-04: qty 30

## 2016-04-04 MED ORDER — HYDROMORPHONE HCL 1 MG/ML IJ SOLN
1.0000 mg | INTRAMUSCULAR | Status: DC | PRN
  Administered 2016-04-04 – 2016-04-05 (×3): 1 mg via INTRAVENOUS
  Filled 2016-04-04 (×3): qty 1

## 2016-04-04 MED ORDER — CEFAZOLIN SODIUM-DEXTROSE 2-3 GM-% IV SOLR
2.0000 g | Freq: Once | INTRAVENOUS | Status: AC
  Administered 2016-04-04: 2 g via INTRAVENOUS
  Filled 2016-04-04: qty 50

## 2016-04-04 SURGICAL SUPPLY — 42 items
BAG ZIPLOCK 12X15 (MISCELLANEOUS) ×3 IMPLANT
BENZOIN TINCTURE PRP APPL 2/3 (GAUZE/BANDAGES/DRESSINGS) ×3 IMPLANT
BLADE SAW SGTL 18X1.27X75 (BLADE) ×2 IMPLANT
BLADE SAW SGTL 18X1.27X75MM (BLADE) ×1
CAPT HIP TOTAL 2 ×3 IMPLANT
CELLS DAT CNTRL 66122 CELL SVR (MISCELLANEOUS) ×1 IMPLANT
CLOSURE WOUND 1/2 X4 (GAUZE/BANDAGES/DRESSINGS) ×1
CLOTH BEACON ORANGE TIMEOUT ST (SAFETY) ×3 IMPLANT
DRAPE STERI IOBAN 125X83 (DRAPES) ×3 IMPLANT
DRAPE U-SHAPE 47X51 STRL (DRAPES) ×6 IMPLANT
DRSG AQUACEL AG ADV 3.5X10 (GAUZE/BANDAGES/DRESSINGS) ×3 IMPLANT
DURAPREP 26ML APPLICATOR (WOUND CARE) ×3 IMPLANT
ELECT REM PT RETURN 9FT ADLT (ELECTROSURGICAL) ×3
ELECTRODE REM PT RTRN 9FT ADLT (ELECTROSURGICAL) ×1 IMPLANT
GLOVE BIO SURGEON STRL SZ7 (GLOVE) ×3 IMPLANT
GLOVE BIOGEL PI IND STRL 6.5 (GLOVE) ×1 IMPLANT
GLOVE BIOGEL PI IND STRL 7.0 (GLOVE) ×2 IMPLANT
GLOVE BIOGEL PI IND STRL 7.5 (GLOVE) ×3 IMPLANT
GLOVE BIOGEL PI IND STRL 8 (GLOVE) ×1 IMPLANT
GLOVE BIOGEL PI INDICATOR 6.5 (GLOVE) ×2
GLOVE BIOGEL PI INDICATOR 7.0 (GLOVE) ×4
GLOVE BIOGEL PI INDICATOR 7.5 (GLOVE) ×6
GLOVE BIOGEL PI INDICATOR 8 (GLOVE) ×2
GLOVE ECLIPSE 6.5 STRL STRAW (GLOVE) ×3 IMPLANT
GLOVE ECLIPSE 8.0 STRL XLNG CF (GLOVE) ×3 IMPLANT
GLOVE SURG SS PI 7.0 STRL IVOR (GLOVE) ×3 IMPLANT
GLOVE SURG SS PI 7.5 STRL IVOR (GLOVE) ×3 IMPLANT
GOWN STRL REUS W/TWL LRG LVL3 (GOWN DISPOSABLE) ×3 IMPLANT
GOWN STRL REUS W/TWL XL LVL3 (GOWN DISPOSABLE) ×12 IMPLANT
HANDPIECE INTERPULSE COAX TIP (DISPOSABLE) ×2
HOLDER FOLEY CATH W/STRAP (MISCELLANEOUS) ×3 IMPLANT
PACK ANTERIOR HIP CUSTOM (KITS) ×3 IMPLANT
RTRCTR WOUND ALEXIS 18CM MED (MISCELLANEOUS) ×3
SET HNDPC FAN SPRY TIP SCT (DISPOSABLE) ×1 IMPLANT
STAPLER VISISTAT 35W (STAPLE) IMPLANT
STRIP CLOSURE SKIN 1/2X4 (GAUZE/BANDAGES/DRESSINGS) ×2 IMPLANT
SUT ETHIBOND NAB CT1 #1 30IN (SUTURE) ×3 IMPLANT
SUT MNCRL AB 4-0 PS2 18 (SUTURE) ×3 IMPLANT
SUT VIC AB 0 CT1 36 (SUTURE) ×3 IMPLANT
SUT VIC AB 1 CT1 36 (SUTURE) ×3 IMPLANT
SUT VIC AB 2-0 CT1 27 (SUTURE) ×4
SUT VIC AB 2-0 CT1 TAPERPNT 27 (SUTURE) ×2 IMPLANT

## 2016-04-04 NOTE — Anesthesia Postprocedure Evaluation (Signed)
Anesthesia Post Note  Patient: Kelsey Rodriguez  Procedure(s) Performed: Procedure(s) (LRB): RIGHT TOTAL HIP ARTHROPLASTY ANTERIOR APPROACH (Right)  Patient location during evaluation: PACU Anesthesia Type: General Level of consciousness: awake and alert Pain management: pain level controlled Vital Signs Assessment: post-procedure vital signs reviewed and stable Respiratory status: spontaneous breathing, nonlabored ventilation, respiratory function stable and patient connected to nasal cannula oxygen Cardiovascular status: blood pressure returned to baseline and stable Postop Assessment: no signs of nausea or vomiting Anesthetic complications: no    Last Vitals:  Filed Vitals:   04/04/16 1400 04/04/16 1504  BP: 118/77 123/71  Pulse: 81 88  Temp: 36.9 C 36.9 C  Resp: 16 18    Last Pain:  Filed Vitals:   04/04/16 1508  PainSc: 10-Worst pain ever                 Eldrige Pitkin S

## 2016-04-04 NOTE — H&P (Signed)
TOTAL HIP ADMISSION H&P  Patient is admitted for right total hip arthroplasty.  Subjective:  Chief Complaint: right hip pain  HPI: Kelsey Rodriguez, 25 y.o. female, has a history of pain and functional disability in the right hip(s) due to avascular necrosis and patient has failed non-surgical conservative treatments for greater than 12 weeks to include NSAID's and/or analgesics, flexibility and strengthening excercises and activity modification.  Onset of symptoms was gradual starting 3 years ago with rapidlly worsening course since that time.The patient noted no past surgery on the right hip(s).  Patient currently rates pain in the right hip at 10 out of 10 with activity. Patient has night pain, worsening of pain with activity and weight bearing, pain that interfers with activities of daily living and pain with passive range of motion. Patient has evidence of subchondral cysts and joint space narrowing by imaging studies. This condition presents safety issues increasing the risk of falls.  There is no current active infection.  Patient Active Problem List   Diagnosis Date Noted  . Avascular necrosis of bone of right hip ([place]) 04/04/2016  . Avascular necrosis of bone of left hip ([place]) 12/14/2015  . 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 Osteonecrosis of both hips ([place]) 06/28/2015  . Nausea with vomiting 05/18/2015  . Overweight (BMI 25.0-29.9) 04/20/2015  . Seasonal allergies 03/20/2015  . Menorrhagia 03/19/2015  . Back pain of lumbar region with sciatica 02/12/2015  . Dizziness and giddiness 01/23/2015  . Primary adrenal insufficiency ([place]) 01/03/2015  . Fatigue 09/25/2014  . Chest pain 07/07/2013  . Laceration of ankle, right 11/18/2012  . 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 Vaginal discharge 05/30/2012  . Arthralgia 05/20/2012  . HIV (human immunodeficiency virus infection) ([place]) 03/16/2012  . Dysphagia 03/11/2012  . 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 Iron  deficiency anemia 03/11/2012  . Reflux esophagitis 03/11/2012   Past Medical History  Diagnosis Date  . Tuberculosis   . HIV (human immunodeficiency virus infection) ([place]) 02/2012  . Pelvic pain   . 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 Bell's palsy 08/26/2013  . Anemia of chronic disease 03/11/2012  . 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 Laceration of ankle, right 11/18/2012  . Reflux esophagitis 03/11/2012  . 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 Vertigo   . Chronic back pain   . Lumbar radiculopathy   . Chronic leg pain     bilateral knees, ankles  . Fatigue   . Adrenal insufficiency [facility])     Past Surgical History  Procedure Laterality Date  . Esophagogastroduodenoscopy  03/11/2012    Procedure: ESOPHAGOGASTRODUODENOSCOPY (EGD);  Surgeon: Lafayette Dragon, MD;  Location: [facility] ENDOSCOPY;  Service: Endoscopy;  Laterality: N/A;  . Appendectomy  ~ 2000  . Lung biopsy  02/2012  . Dilation and curettage of uterus  2008  . Esophagogastroduodenoscopy N/A 03/07/2014    Procedure: ESOPHAGOGASTRODUODENOSCOPY (EGD);  Surgeon: Gatha Mayer, MD;  Location: [facility] ENDOSCOPY;  Service: Endoscopy;  Laterality: N/A;  . Total hip arthroplasty Left 12/14/2015    Procedure: LEFT TOTAL HIP ARTHROPLASTY ANTERIOR APPROACH;  Surgeon: Mcarthur Rossetti, MD;  Location: [facility]S;  Service: Orthopedics;  Laterality: Left;    No prescriptions prior to admission   Allergies  Allergen Reactions  . Hydrocodone Itching and Nausea Only    Tolerates Oxycodone  . Tramadol Itching and Nausea Only    Tolerates oxycodone    Social History  Substance Use Topics  .  Smoking status: Never Smoker   . Smokeless tobacco: Never Used  . Alcohol Use: No     Comment: socially    Family History  Problem Relation Age of Onset  . Heart disease Father     Vague not clearly cardiac     Review of Systems  All other systems reviewed and are negative.   Objective:  Physical  Exam  Constitutional: She is oriented to person, place, and time. She appears well-developed and well-nourished.  HENT:  Head: Normocephalic and atraumatic.  Eyes: EOM are normal. Pupils are equal, round, and reactive to light.  Neck: Normal range of motion. Neck supple.  Cardiovascular: Normal rate and regular rhythm.   Respiratory: Effort normal and breath sounds normal.  GI: Soft. Bowel sounds are normal.  Musculoskeletal:       Right hip: She exhibits decreased range of motion, decreased strength, tenderness and bony tenderness.  Neurological: She is alert and oriented to person, place, and time.  Skin: Skin is warm and dry.  Psychiatric: She has a normal mood and affect.    Vital signs in last 24 hours: Temp:  [98.3 F (36.8 C)] 98.3 F (36.8 C) (04/20 1505) Pulse Rate:  [75] 75 (04/20 1505) BP: (120)/(85) 120/85 mmHg (04/20 1505) Weight:  [71.668 kg (158 lb)] 71.668 kg (158 lb) (04/20 1505)  Labs:   Estimated body mass index is 26.89 kg/(m^2) as calculated from the following:   Height as of 12/14/15: 5\' 3"  (1.6 m).   Weight as of 01/10/16: 68.833 kg (151 lb 12 oz).   Imaging Review Plain radiographs demonstrate severe avascular necrosis of the right hip(s). The bone quality appears to be excellent for age and reported activity level.  Assessment/Plan:  Avascular necrosis related to HIV status, right hip(s)  The patient history, physical examination, clinical judgement of the provider and imaging studies are consistent with end stage AVN of the right hip(s) and total hip arthroplasty is deemed medically necessary. The treatment options including medical management, injection therapy, arthroscopy and arthroplasty were discussed at length. The risks and benefits of total hip arthroplasty were presented and reviewed. The risks due to aseptic loosening, infection, stiffness, dislocation/subluxation,  thromboembolic complications and other imponderables were discussed.  The  patient acknowledged the explanation, agreed to proceed with the plan and consent was signed. Patient is being admitted for inpatient treatment for surgery, pain control, PT, OT, prophylactic antibiotics, VTE prophylaxis, progressive ambulation and ADL's and discharge planning.The patient is planning to be discharged home with home health services

## 2016-04-04 NOTE — Anesthesia Procedure Notes (Signed)
Procedure Name: Intubation Date/Time: 04/04/2016 10:03 AM Performed by: Freddie Breech Pre-anesthesia Checklist: Patient identified, Emergency Drugs available, Suction available, Patient being monitored and Timeout performed Patient Re-evaluated:Patient Re-evaluated prior to inductionOxygen Delivery Method: Circle system utilized Preoxygenation: Pre-oxygenation with 100% oxygen Intubation Type: IV induction Ventilation: Mask ventilation without difficulty and Oral airway inserted - appropriate to patient size Laryngoscope Size: Mac and 3 Grade View: Grade I Tube type: Oral Tube size: 6.5 mm Number of attempts: 1 Airway Equipment and Method: Patient positioned with wedge pillow and Stylet Placement Confirmation: ETT inserted through vocal cords under direct vision,  positive ETCO2,  CO2 detector and breath sounds checked- equal and bilateral Secured at: 22 cm Tube secured with: Tape Dental Injury: Teeth and Oropharynx as per pre-operative assessment

## 2016-04-04 NOTE — Op Note (Signed)
Kelsey Rodriguez, Kelsey Rodriguez                  ACCOUNT NO.:  [id number]  MEDICAL RECORD NO.:  [id number]  LOCATION:  [facility]                         FACILITY:  [facility]  PHYSICIAN:  Lind Guest. 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  PROCEDURE:  Right total hip arthroplasty, direct anterior approach.  IMPLANTS:  DePuy Sector Gription acetabular component size 48, size 32 neutral polyethylene liner, size 10 Corail femoral component with standard offset, size 32+ 1 ceramic hip ball.  SURGEON:  Lind Guest. Ninfa Linden, M.D.  ANESTHESIA:  General.  ANTIBIOTICS:  2 g IV Ancef.  BLOOD LOSS:  200 mL.  COMPLICATIONS:  None.  INDICATIONS:  Kelsey Rodriguez is a 25 year old patient who is HIV positive but has an undetectable viral load and normal CD4 count.  She has avascular necrosis likely related to her history of being HIV positive.  This is something that is hardly known in the HIV population.  She has had a successful left total hip arthroplasty just this past December, now she presents to have this done on the right side.  She has radiographic evidence of severe AVN involving the femoral head and acetabulum on the right side.  Her pain is daily, it has detrimentally affected her mobility, quality of life and her activities of daily living.  PROCEDURE DESCRIPTION:  After informed consent was obtained, appropriate right hip was marked.  She was brought to the operating room.  General anesthesia was obtained while she was on her stretcher.  She was next placed supine on the Hana fracture table, the perineal post in place and both legs in inline skeletal traction devices, but no traction applied. Her right operative hip was then  prepped and draped with DuraPrep and sterile drapes.  Time-out was called.  She was identified as correct patient, correct right hip.  I then made incision inferior and posterior to the anterior superior iliac spine and carried this obliquely down the leg.  I dissected down the tensor fascia lata muscle.  Tensor fascia was then divided longitudinally, so we could proceed with a direct anterior approach to the hip.  We identified and cauterized the lateral femoral circumflex vessels and then identified the hip capsule.  I opened up the hip capsule in L-type format.  I then placed Cobra retractors within the hip capsule and made my femoral neck cut with an oscillating saw proximal to the lesser trochanter and completed this on osteotome.  I placed a corkscrew guide in the femoral head, removed the femoral head in its entirety.  We then cleaned the acetabular remnants of the acetabular labrum and other debris.  I placed a bent Hohmann over the medial acetabular rim and then began reaming under direct visualization from a size 42 reamer up to a size 48 with the size 48 also placed under direct fluoroscopy, so I could obtain my depth of reaming, our inclination, and anteversion.  Once  we were pleased with this, we placed the real DePuy Sector Gription acetabular component size 48 and a 32 neutral polyethylene liner for that size acetabular component. Attention was then turned to the femur.  With the leg externally rotated to 100 degrees, extended and adducted, we were to place a Mueller retractor medially and a Hohmann retractor behind the greater trochanter.  I released the lateral joint capsule and used a box cutting osteotome to enter the femoral canal and a rongeur lateralized.  I then began broaching from a size 8 broach up to a size 10 broach.  This corresponded to her other side as well.  We plan this is a standard offset femoral neck and a 32+ 1 hip ball and brought the leg back  over and up with traction and rotation reducing the pelvis and we were pleased with range of motion offset, leg length, and stability.  We then dislocated the hip and removed the trial components.  We were able to place the real Corail femoral component with standard offset, size 10 and the real 32+ 1 hip ball.  We reduced this in the acetabulum and again, it was stable.  We then copiously irrigated soft tissue with normal saline solution using pulsatile lavage.  We closed the joint capsule with interrupted #1 Ethibond suture followed by running #1 Vicryl in the tensor fascia, 0 Vicryl deep tissue, 0 Vicryl to subcutaneous tissue, 4-0 Monocryl subcuticular stitch, and Steri-Strips on the skin.  An Aquacel dressing was applied.  She was then taken off the Hana table, awakened, extubated, and taken to recovery room in stable condition.  All final counts were correct.  There were no complications noted.     Lind Guest. Ninfa Linden, M.D.     CYB/MEDQ  D:  04/04/2016  T:  04/04/2016  Job:  [id number]

## 2016-04-04 NOTE — Transfer of Care (Signed)
Immediate Anesthesia Transfer of Care Note  Patient: Kelsey Rodriguez  Procedure(s) Performed: Procedure(s): RIGHT TOTAL HIP ARTHROPLASTY ANTERIOR APPROACH (Right)  Patient Location: PACU  Anesthesia Type:General  Level of Consciousness:  sedated, patient cooperative and responds to stimulation  Airway & Oxygen Therapy:Patient Spontanous Breathing and Patient connected to face mask oxgen  Post-op Assessment:  Report given to PACU RN and Post -op Vital signs reviewed and stable  Post vital signs:  Reviewed and stable  Last Vitals:  Filed Vitals:   04/04/16 0812  BP: 120/78  Pulse: 86  Temp: 36.6 C  Resp: 18    Complications: No apparent anesthesia complications

## 2016-04-04 NOTE — Progress Notes (Signed)
X-ray results noted 

## 2016-04-04 NOTE — Brief Op Note (Signed)
04/04/2016  10:50 AM  PATIENT:  Kelsey Rodriguez  25 y.o. female  PRE-OPERATIVE DIAGNOSIS:  avascular necrosis right hip  POST-OPERATIVE DIAGNOSIS:  avascular necrosis right hip  PROCEDURE:  Procedure(s): RIGHT TOTAL HIP ARTHROPLASTY ANTERIOR APPROACH (Right)  SURGEON:  Surgeon(s) and Role:    * Mcarthur Rossetti, MD - Primary  ANESTHESIA:   general  EBL:  Total I/O In: 1000 [I.V.:1000] Out: 200 [Blood:200]  COUNTS:  YES  TOURNIQUET:  * No tourniquets in log *  DICTATION: .Other Dictation: Dictation Number [id number]  PLAN OF CARE: Admit to inpatient   PATIENT DISPOSITION:  PACU - hemodynamically stable.   Delay start of Pharmacological VTE agent (>24hrs) due to surgical blood loss or risk of bleeding: no

## 2016-04-04 NOTE — Progress Notes (Signed)
Patient ID: [name], female   DOB: 08-31-1991, 25 y.o.   MRN: [id number] [name] reports numbness in her right foot post-op.  On exam, she is showing signs of foot drop.  I placed a pillow under her right knee to flex her knee.  Will follow closely.

## 2016-04-04 NOTE — Progress Notes (Signed)
Portable AP Pelvis and Lateral Right Hip X-rays done. 

## 2016-04-04 NOTE — Anesthesia Preprocedure Evaluation (Signed)
Anesthesia Evaluation  Patient identified by MRN, date of birth, ID band Patient awake    Reviewed: Allergy & Precautions, NPO status , Patient's Chart, lab work & pertinent test results  Airway Mallampati: II  TM Distance: >3 FB Neck ROM: Full    Dental no notable dental hx.    Pulmonary neg pulmonary ROS,    Pulmonary exam normal breath sounds clear to auscultation       Cardiovascular negative cardio ROS Normal cardiovascular exam Rhythm:Regular Rate:Normal     Neuro/Psych negative neurological ROS  negative psych ROS   GI/Hepatic negative GI ROS, Neg liver ROS,   Endo/Other  Adrenal insufficiency   Renal/GU negative Renal ROS  negative genitourinary   Musculoskeletal negative musculoskeletal ROS (+)   Abdominal   Peds negative pediatric ROS (+)  Hematology  (+) anemia , HIV,   Anesthesia Other Findings   Reproductive/Obstetrics negative OB ROS                             Anesthesia Physical Anesthesia Plan  ASA: III  Anesthesia Plan: General   Post-op Pain Management:    Induction: Intravenous  Airway Management Planned: Oral ETT  Additional Equipment:   Intra-op Plan:   Post-operative Plan: Extubation in OR  Informed Consent: I have reviewed the patients History and Physical, chart, labs and discussed the procedure including the risks, benefits and alternatives for the proposed anesthesia with the patient or authorized representative who has indicated his/her understanding and acceptance.   Dental advisory given  Plan Discussed with: CRNA and Surgeon  Anesthesia Plan Comments:         Anesthesia Quick Evaluation

## 2016-04-04 NOTE — Progress Notes (Signed)
Ninfa Linden, MD made aware of poor mobility of RLE, and patient being unable to dorsiflex her toes of the operative extremity.   Patient had general anesthesia, and no nerve block is noted in the chart.   MD stated back "noted" to RN in OR with MD at the time.   This note added for documentation purposes of patient complaints that are concerning to RN. Benedetto Goad, RN

## 2016-04-05 LAB — BASIC METABOLIC PANEL
Anion gap: 10 (ref 5–15)
BUN: 6 mg/dL (ref 6–20)
CO2: 23 mmol/L (ref 22–32)
Calcium: 8.9 mg/dL (ref 8.9–10.3)
Chloride: 109 mmol/L (ref 101–111)
Creatinine, Ser: 0.57 mg/dL (ref 0.44–1.00)
GFR calc Af Amer: >60 mL/min (ref >60)
GFR calc non Af Amer: >60 mL/min (ref >60)
Glucose, Bld: 121 mg/dL — ABNORMAL HIGH (ref 65–99)
Potassium: 3.9 mmol/L (ref 3.5–5.1)
Sodium: 142 mmol/L (ref 135–145)

## 2016-04-05 LAB — CBC
HCT: 31.8 % — ABNORMAL LOW (ref 36.0–46.0)
Hemoglobin: 10.5 g/dL — ABNORMAL LOW (ref 12.0–15.0)
MCH: 28.5 pg (ref 26.0–34.0)
MCHC: 33 g/dL (ref 30.0–36.0)
MCV: 86.4 fL (ref 78.0–100.0)
Platelets: 306 10*3/uL (ref 150–400)
RBC: 3.68 MIL/uL — ABNORMAL LOW (ref 3.87–5.11)
RDW: 16.8 % — ABNORMAL HIGH (ref 11.5–15.5)
WBC: 12.3 10*3/uL — ABNORMAL HIGH (ref 4.0–10.5)

## 2016-04-05 MED ORDER — OXYCODONE-ACETAMINOPHEN 5-325 MG PO TABS: 1.0000 | ORAL_TABLET | Freq: Two times a day (BID) | ORAL | Status: DC | PRN

## 2016-04-05 MED ORDER — METHOCARBAMOL 500 MG PO TABS: 500.0000 mg | ORAL_TABLET | Freq: Four times a day (QID) | ORAL | Status: DC | PRN

## 2016-04-05 MED ORDER — ASPIRIN 325 MG PO TBEC: 325.0000 mg | DELAYED_RELEASE_TABLET | Freq: Two times a day (BID) | ORAL | Status: DC

## 2016-04-05 NOTE — Progress Notes (Signed)
Subjective: Patient stable pain relatively well controlled she states that her right leg is not really working well   Objective: Vital signs in last 24 hours: Temp:  [97.9 F (36.6 C)-98.7 F (37.1 C)] 98.7 F (37.1 C) (04/22 0606) Pulse Rate:  [71-110] 71 (04/22 0606) Resp:  [12-21] 16 (04/22 0606) BP: (118-142)/(71-97) 121/78 mmHg (04/22 0606) SpO2:  [99 %-100 %] 99 % (04/22 0606) Weight:  [71.668 kg (158 lb)] 71.668 kg (158 lb) (04/21 0812)  Intake/Output from previous day: 04/21 0701 - 04/22 0700 In: 3753.8 [P.O.:755; I.V.:2948.8; IV Piggyback:50] Out: [id number] [Urine:1325; Blood:200] Intake/Output this shift:    Exam:  Right foot demonstrates intact plantar flexion but dorsiflexion is absent.  She does have pierced seizures on the dorsal aspect of her foot plantar sensation is intact  Labs:  Recent Labs  04/05/16 0417  HGB 10.5*    Recent Labs  04/05/16 0417  WBC 12.3*  RBC 3.68*  HCT 31.8*  PLT 306    Recent Labs  04/05/16 0417  NA 142  K 3.9  CL 109  CO2 23  BUN 6  CREATININE 0.57  GLUCOSE 121*  CALCIUM 8.9   No results for input(s): LABPT, INR in the last 72 hours.  Assessment/Plan: Impression is patient doing reasonably well following hip replacement but she does have evidence of right foot drop.  This appears to been present since recovery room.  Leg was elevated on a pillow following surgery.  This allowed the knee to flex to diminish any pressure or stretch on the peroneal nerve.  Anticipate plan this time would be observation.  Patient may need AFO in order to ambulate.  Dr. Ninfa Linden did identify this situation in recovery room yesterday   DEAN,GREGORY SCOTT 04/05/2016, 7:08 AM

## 2016-04-05 NOTE — Evaluation (Signed)
Physical Therapy Evaluation Patient Details Name: Kelsey Rodriguez MRN: [id number] DOB: [birth year] Today's Date: 04/05/2016   History of Present Illness  Pt is s/p R THA-DA with noted R foot/drop post op.  Pt with history of L THA in 11/2015, anemia, HIV, chronic back pain, vertigo.   Clinical Impression  Pt s/p R THR presents with decreased R LE strength/ROM, post op pain and post op R foot drop limiting functional mobility.  Pt should progress to dc home with family assist and would benefit from follow up [place].    Follow Up Recommendations Home health PT    Equipment Recommendations  None recommended by PT    Recommendations for Other Services OT consult     Precautions / Restrictions Precautions Precautions: Fall Precaution Comments: R foot drop post op.  Dr Ninfa Linden encouraging knee flex at rest to reduce stress on Peroneal Nerve Restrictions Weight Bearing Restrictions: No Other Position/Activity Restrictions: WBAT      Mobility  Bed Mobility Overal bed mobility: Needs Assistance Bed Mobility: Supine to Sit     Supine to sit: Min assist     General bed mobility comments: pt OOB in the bathroom when OT arrived.  Transfers Overall transfer level: Needs assistance Equipment used: Rolling walker (2 wheeled) Transfers: Sit to/from Stand Sit to Stand: Min assist;Mod assist         General transfer comment: min assist stand to sit.   Ambulation/Gait Ambulation/Gait assistance: Min assist;Mod assist Ambulation Distance (Feet): 28 Feet Assistive device: Rolling walker (2 wheeled) Gait Pattern/deviations: Step-to pattern;Step-through pattern;Decreased step length - left;Shuffle;Trunk flexed Gait velocity: decr Gait velocity interpretation: Below normal speed for age/gender General Gait Details: cues for sequence, posture and position from RW.  Inititially assist required to advance R LE but by midpoint pt able to advance IND but unable to clear foot from  floor  Stairs            Wheelchair Mobility    Modified Rankin (Stroke Patients Only)       Balance                                             Pertinent Vitals/Pain Pain Assessment: 0-10 Pain Score: 6  Pain Location: R hip/thigh Pain Descriptors / Indicators: Aching;Cramping;Sore Pain Intervention(s): Limited activity within patient's tolerance;Monitored during session;Premedicated before session;Ice applied (Muscle relax requested)    Home Living Family/patient expects to be discharged to:: Private residence Living Arrangements: Parent;Other relatives Available Help at Discharge: Family Type of Home: House Home Access: Stairs to enter Entrance Stairs-Rails: Chemical engineer of Steps: 6 Home Layout: One level Home Equipment: Grab bars - tub/shower;Walker - 2 wheels;Bedside commode      Prior Function Level of Independence: Needs assistance      ADL's / Homemaking Assistance Needed: needs assist with LB dressing and bathing.  Comments: some of history taken from previous admission.     Hand Dominance   Dominant Hand: Right    Extremity/Trunk Assessment   Upper Extremity Assessment: Overall WFL for tasks assessed           Lower Extremity Assessment: RLE deficits/detail RLE Deficits / Details: Strengtha t hip 2+/5 with AAROM at hip to 85 flex and 15 abd.  Quad strength 3/5.  1/5 dorsiflextion with pt able to extend toes and flicker of muscle activity noted    Cervical / Trunk  Assessment: Normal  Communication   Communication: No difficulties  Cognition Arousal/Alertness: Awake/alert Behavior During Therapy: WFL for tasks assessed/performed Overall Cognitive Status: Within Functional Limits for tasks assessed                      General Comments      Exercises Total Joint Exercises Ankle Circles/Pumps: Both;PROM;AROM;10 reps;Supine Heel Slides: AAROM;Right;15 reps;Supine Hip  ABduction/ADduction: AAROM;Right;15 reps;Supine      Assessment/Plan    PT Assessment Patient needs continued PT services  PT Diagnosis Difficulty walking   PT Problem List Decreased strength;Decreased range of motion;Decreased activity tolerance;Decreased mobility;Decreased knowledge of use of DME;Pain  PT Treatment Interventions DME instruction;Gait training;Stair training;Functional mobility training;Therapeutic activities;Therapeutic exercise;Patient/family education   PT Goals (Current goals can be found in the Care Plan section) Acute Rehab PT Goals Patient Stated Goal: wants to return to independence. PT Goal Formulation: With patient Time For Goal Achievement: 04/10/16 Potential to Achieve Goals: Good    Frequency 7X/week   Barriers to discharge        Co-evaluation               End of Session Equipment Utilized During Treatment: Gait belt Activity Tolerance: Patient tolerated treatment well Patient left: in chair;with call bell/phone within reach Nurse Communication: Mobility status         Time: [id number] PT Time Calculation (min) (ACUTE ONLY): 35 min   Charges:   PT Evaluation $PT Eval Low Complexity: 1 Procedure PT Treatments $Therapeutic Exercise: 8-22 mins   PT G Codes:        Kelsey Rodriguez 2016/04/13, 3:43 PM

## 2016-04-05 NOTE — Care Management Note (Signed)
Case Management Note  Patient Details  Name: Syleena Reposa MRN: [id number] Date of Birth: [birth year]  Subjective/Objective:  Right total hip arthroplasty                  Action/Plan: Discharge Planning:   NCM spoke to pt at bedside. States she had [facility] in the past for [facility]. States she has RW and 3n1 at home. Needs tub bench for home. Contacted [facility] DME rep for tub bench for home. States mother is at home to assist with care. She has applied for Medicaid and Disability.   Expected Discharge Date:  04/07/2016               Expected Discharge Plan:  [place]  In-House Referral:  NA  Discharge planning Services  CM Consult  Post Acute Care Choice:  Home Health Choice offered to:  Patient  DME Arranged:  Tub bench DME Agency:  [place]:  PT [facility] Agency:  [place]  Status of Service:  Completed, signed off  Medicare Important Message Given:    Date Medicare IM Given:    Medicare IM give by:    Date Additional Medicare IM Given:    Additional Medicare Important Message give by:     If discussed at [place] of Stay Meetings, dates discussed:    Additional Comments:  Erenest Rasher, RN 04/05/2016, 4:30 PM

## 2016-04-05 NOTE — Progress Notes (Signed)
Physical Therapy Treatment Patient Details Name: Kelsey Rodriguez MRN: [id number] DOB: [birth year] Today's Date: 04/05/2016    History of Present Illness Pt is s/p R THA-DA with noted R foot/drop post op.  Pt with history of L THA in 11/2015, anemia, HIV, chronic back pain, vertigo.     PT Comments    Pt very motivated and progressing well with mobility.  Pt to request family to bring sneakers for possible AFO use.  Follow Up Recommendations  Home health PT     Equipment Recommendations  None recommended by PT    Recommendations for Other Services OT consult     Precautions / Restrictions Precautions Precautions: Fall Precaution Comments: R foot drop post op.  Dr Ninfa Linden encouraging knee flex at rest to reduce stress on Peroneal Nerve Restrictions Weight Bearing Restrictions: No Other Position/Activity Restrictions: WBAT    Mobility  Bed Mobility Overal bed mobility: Needs Assistance Bed Mobility: Supine to Sit     Supine to sit: Min assist     General bed mobility comments: Pt OOB and declines back to bed  Transfers Overall transfer level: Needs assistance Equipment used: Rolling walker (2 wheeled) Transfers: Sit to/from Stand Sit to Stand: Min assist         General transfer comment: cues for LE management and use of UEs to self assist  Ambulation/Gait Ambulation/Gait assistance: Min assist;Min guard Ambulation Distance (Feet): 125 Feet Assistive device: Rolling walker (2 wheeled) Gait Pattern/deviations: Step-to pattern;Step-through pattern;Decreased step length - right;Shuffle;Trunk flexed Gait velocity: decr Gait velocity interpretation: Below normal speed for age/gender General Gait Details: cues for sequence, posture and position from RW.  Inititially assist required to advance R LE but after first few steps, pt able to advance IND but unable to clear foot from floor   Stairs            Wheelchair Mobility    Modified Rankin (Stroke Patients  Only)       Balance                                    Cognition Arousal/Alertness: Awake/alert Behavior During Therapy: WFL for tasks assessed/performed Overall Cognitive Status: Within Functional Limits for tasks assessed                      Exercises Total Joint Exercises Ankle Circles/Pumps: Both;PROM;AROM;10 reps;Supine Heel Slides: AAROM;Right;15 reps;Supine Hip ABduction/ADduction: AAROM;Right;15 reps;Supine    General Comments        Pertinent Vitals/Pain Pain Assessment: Faces Pain Score: 4  Pain Location: R hip/thigh Pain Descriptors / Indicators: Aching;Sore Pain Intervention(s): Limited activity within patient's tolerance;Monitored during session;Premedicated before session;Ice applied    Home Living Family/patient expects to be discharged to:: Private residence Living Arrangements: Parent;Other relatives Available Help at Discharge: Family Type of Home: House Home Access: Stairs to enter Entrance Stairs-Rails: Left;Right Home Layout: One level Home Equipment: Grab bars - tub/shower;Walker - 2 wheels;Bedside commode      Prior Function Level of Independence: Needs assistance    ADL's / Homemaking Assistance Needed: needs assist with LB dressing and bathing. Comments: some of history taken from previous admission.   PT Goals (current goals can now be found in the care plan section) Acute Rehab PT Goals Patient Stated Goal: wants to return to independence. PT Goal Formulation: With patient Time For Goal Achievement: 04/10/16 Potential to Achieve Goals: Good Progress towards PT goals: Progressing  toward goals    Frequency  7X/week    PT Plan Current plan remains appropriate    Co-evaluation             End of Session Equipment Utilized During Treatment: Gait belt Activity Tolerance: Patient tolerated treatment well Patient left: in chair;with call bell/phone within reach     Time: 1448-1515 PT Time Calculation  (min) (ACUTE ONLY): 27 min  Charges:  $Gait Training: 23-37 mins $Therapeutic Exercise: 8-22 mins                    G Codes:      Jonanthan Bolender 2016-04-29, 3:51 PM

## 2016-04-05 NOTE — Evaluation (Addendum)
Occupational Therapy Evaluation Patient Details Name: Goldena Cutri MRN: [id number] DOB: 1991-02-01 Today's Date: 04/05/2016    History of Present Illness Pt is s/p R THA-DA with post op R foot drop. Pt with history of L THA in 11/2015, anemia, chronic back pain, vertigo.    Clinical Impression   Pt currently limited by pain and also by R foot drop issues. Will benefit from continued OT services to progress ADL independence and continue education on AE and DME options available to help her be more independent. Will follow.    Follow Up Recommendations  Home health OT;Supervision/Assistance - 24 hour    Equipment Recommendations  Tub/shower bench;Other (comment) (if covered )    Recommendations for Other Services       Precautions / Restrictions Precautions Precautions: post op R foot drop. Restrictions Weight Bearing Restrictions: No      Mobility Bed Mobility               General bed mobility comments: pt OOB in the bathroom when OT arrived.  Transfers Overall transfer level: Needs assistance Equipment used: Rolling walker (2 wheeled)             General transfer comment: min assist stand to sit.     Balance                                            ADL Overall ADL's : Needs assistance/impaired Eating/Feeding: Independent;Sitting   Grooming: Wash/dry hands;Set up;Sitting   Upper Body Bathing: Set up;Sitting   Lower Body Bathing: Moderate assistance;Sit to/from stand   Upper Body Dressing : Set up;Sitting   Lower Body Dressing: Moderate assistance;Sit to/from stand;Maximal assistance   Toilet Transfer: Minimal assistance   Toileting- Clothing Manipulation and Hygiene: Minimal assistance;Sit to/from stand         General ADL Comments: Pt currently having R foot drop and also limited by 10/10 pain during session today. Pt states her L hip surgery went much better in 11/2015. Discussed AE options as pt states she is still not  able to bend forward to put on her L sock/shoe since her surgery. Demonstrated all AE and explained benefit of AE for helping with bilateral LE ADL. Also discussed tub transfer bench as pt states she was able to step over the tub in about a week after the last surgery but she anticipates needing a tubseat this time.      Vision     Perception     Praxis      Pertinent Vitals/Pain Pain Assessment: 0-10 Pain Score: 10-Worst pain ever Pain Location: R hip Pain Descriptors / Indicators: Aching;Discomfort Pain Intervention(s): Monitored during session;Repositioned;Ice applied     Hand Dominance Right   Extremity/Trunk Assessment Upper Extremity Assessment Upper Extremity Assessment: Overall WFL for tasks assessed           Communication Communication Communication: No difficulties   Cognition Arousal/Alertness: Awake/alert Behavior During Therapy: WFL for tasks assessed/performed Overall Cognitive Status: Within Functional Limits for tasks assessed                     General Comments       Exercises       Shoulder Instructions      Home Living Family/patient expects to be discharged to:: Private residence Living Arrangements: Parent;Other relatives Available Help at Discharge: Family Type of Home:  House Home Access: Stairs to enter [employer] of Steps: 6 Entrance Stairs-Rails: Left;Right Home Layout: One level     Bathroom Shower/Tub: Tub/shower unit Shower/tub characteristics: Architectural technologist: Standard     Home Equipment: Grab bars - tub/shower;Walker - 2 wheels;Bedside commode          Prior Functioning/Environment Level of Independence: Needs assistance    ADL's / Homemaking Assistance Needed: needs assist with LB dressing and bathing.   Comments: some of history taken from previous admission.    OT Diagnosis: Generalized weakness;Acute pain   OT Problem List: Decreased strength;Pain;Decreased knowledge of use of  DME or AE   OT Treatment/Interventions: Self-care/ADL training;DME and/or AE instruction;Therapeutic activities;Patient/family education    OT Goals(Current goals can be found in the care plan section) Acute Rehab OT Goals Patient Stated Goal: wants to return to independence. OT Goal Formulation: With patient Time For Goal Achievement: 04/12/16 Potential to Achieve Goals: Good  OT Frequency: Min 2X/week   Barriers to D/C:            Co-evaluation              End of Session Equipment Utilized During Treatment: Rolling walker  Activity Tolerance: Patient limited by pain Patient left: in chair;with call bell/phone within reach   Time: [id number] OT Time Calculation (min): 18 min Charges:  OT General Charges $OT Visit: 1 Procedure OT Evaluation $OT Eval Low Complexity: 1 Procedure G-Codes:    Jules Schick  [id number] 04/05/2016, 3:03 PM

## 2016-04-05 NOTE — Discharge Instructions (Signed)

## 2016-04-06 NOTE — Progress Notes (Signed)
Physical Therapy Treatment Patient Details Name: Kelsey Rodriguez MRN: [id number] DOB: March 08, 1991 Today's Date: 04/06/2016    History of Present Illness Pt is s/p R THA-DA with noted R foot/drop post op.  Pt with history of L THA in 11/2015, anemia, HIV, chronic back pain, vertigo.     PT Comments    Pt progressing, concerned about foot drop; ace wrap left in room to try df assist tomorrow  Follow Up Recommendations  Home health PT     Equipment Recommendations  None recommended by PT    Recommendations for Other Services OT consult     Precautions / Restrictions Precautions Precautions: Fall Precaution Comments: R foot drop post op.  Dr Ninfa Linden encouraging knee flex at rest to reduce stress on Peroneal Nerve--reinforced with pt Required Braces or Orthoses: Other Brace/Splint Other Brace/Splint: has ASO, no shoes with her currently--reviewed importance of wearing shoe with ASO (removed after therapy once pt back in bed  d/t significant compression Restrictions Weight Bearing Restrictions: No Other Position/Activity Restrictions: WBAT    Mobility  Bed Mobility Overal bed mobility: Needs Assistance Bed Mobility: Sit to Supine       Sit to supine: Min assist   General bed mobility comments: assist with RLE, incr time, effortful transition  Transfers Overall transfer level: Needs assistance Equipment used: Rolling walker (2 wheeled) Transfers: Sit to/from Stand Sit to Stand: Supervision         General transfer comment: cues for LE management and use of UEs to self assist  Ambulation/Gait Ambulation/Gait assistance: Supervision;Min guard Ambulation Distance (Feet): 160 Feet Assistive device: Rolling walker (2 wheeled) Gait Pattern/deviations: Step-to pattern Gait velocity: decr   General Gait Details: cues for sequence, RW position, RLE advancement   Stairs            Wheelchair Mobility    Modified Rankin (Stroke Patients Only)       Balance                                     Cognition Arousal/Alertness: Awake/alert Behavior During Therapy: WFL for tasks assessed/performed Overall Cognitive Status: Within Functional Limits for tasks assessed                      Exercises Total Joint Exercises Ankle Circles/Pumps: Both;PROM;AROM;10 reps;Supine;Limitations Ankle Circles/Pumps Limitations: right foot drop, ?1/5 df on R; pf 3+/5 Short Arc Quad: AROM;Right;10 reps Heel Slides: AAROM;Right;5 reps;Limitations Heel Slides Limitations: pain    General Comments        Pertinent Vitals/Pain Pain Assessment: 0-10 Pain Score: 7  Pain Location: R hip Pain Descriptors / Indicators: Grimacing;Sore Pain Intervention(s): Limited activity within patient's tolerance;Monitored during session;Premedicated before session;Repositioned;Patient requesting pain meds-RN notified    Home Living                      Prior Function            PT Goals (current goals can now be found in the care plan section) Acute Rehab PT Goals Patient Stated Goal: wants to return to independence, concerned about foot drop PT Goal Formulation: With patient Time For Goal Achievement: 04/10/16 Potential to Achieve Goals: Good Progress towards PT goals: Progressing toward goals    Frequency  7X/week    PT Plan Current plan remains appropriate    Co-evaluation  End of Session Equipment Utilized During Treatment: Gait belt Activity Tolerance: Patient tolerated treatment well Patient left: in bed;with call bell/phone within reach;with bed alarm set     Time: [id number] PT Time Calculation (min) (ACUTE ONLY): 30 min  Charges:  $Gait Training: 23-37 mins                    G Codes:      Kelsey Rodriguez 28-Apr-2016, 3:39 PM

## 2016-04-06 NOTE — Progress Notes (Signed)
   04/06/16 1039  PT Visit Information  Last PT Received On 04/06/16  Reason Eval/Treat Not Completed Patient declined, no reason specified (pt refused d/t fatigue--amb to end of  hall with OT)

## 2016-04-06 NOTE — Progress Notes (Signed)
Subjective: Patient feels better today   Objective: Vital signs in last 24 hours: Temp:  [98.8 F (37.1 C)-99.4 F (37.4 C)] 99.4 F (37.4 C) (04/23 0635) Pulse Rate:  [83-95] 95 (04/23 0635) Resp:  [16] 16 (04/23 0635) BP: (113-122)/(73-80) 113/73 mmHg (04/23 0635) SpO2:  [100 %] 100 % (04/23 0635)  Intake/Output from previous day: 04/22 0701 - 04/23 0700 In: 1080 [P.O.:960; I.V.:120] Out: 850 [Urine:850] Intake/Output this shift:    Exam:  Intact pulses distally Dorsiflexion/Plantar flexion intact  Labs:  Recent Labs  04/05/16 0417  HGB 10.5*    Recent Labs  04/05/16 0417  WBC 12.3*  RBC 3.68*  HCT 31.8*  PLT 306    Recent Labs  04/05/16 0417  NA 142  K 3.9  CL 109  CO2 23  BUN 6  CREATININE 0.57  GLUCOSE 121*  CALCIUM 8.9   No results for input(s): LABPT, INR in the last 72 hours.  Assessment/Plan: Patient doing better today she's mobilizing she does have a brace but her ankle and toe dorsiflexion has returned. Strength is still about 4 out of 5 but definitely better than was yesterday - plan discharge tomorrow   Kelsey Rodriguez 04/06/2016, 11:02 AM

## 2016-04-06 NOTE — Progress Notes (Signed)
Occupational Therapy Treatment Patient Details Name: Kelsey Rodriguez MRN: [id number] DOB: 03-18-1991 Today's Date: 04/06/2016    History of present illness Pt is s/p R THA-DA with noted R foot/drop post op.  Pt with history of L THA in 11/2015, anemia, HIV, chronic back pain, vertigo.    OT comments  Pt instructed in use of AE for LB ADLs and is able to perform ADLs with supervision.  Will continue to follow.   Follow Up Recommendations  Home health OT;Supervision/Assistance - 24 hour    Equipment Recommendations  Tub/shower bench;Other (comment)    Recommendations for Other Services      Precautions / Restrictions Precautions Precautions: Fall Precaution Comments: R foot drop post op.  Dr Ninfa Linden encouraging knee flex at rest to reduce stress on Peroneal Nerve Restrictions Weight Bearing Restrictions: No Other Position/Activity Restrictions: WBAT       Mobility Bed Mobility Overal bed mobility: Needs Assistance Bed Mobility: Supine to Sit     Supine to sit: Supervision        Transfers Overall transfer level: Needs assistance Equipment used: Rolling walker (2 wheeled) Transfers: Sit to/from [employer] Sit to Stand: Supervision Stand pivot transfers: Supervision            Balance                                   ADL Overall ADL's : Needs assistance/impaired             Lower Body Bathing: Supervison/ safety;Sit to/from stand;With adaptive equipment       Lower Body Dressing: Supervision/safety;With adaptive equipment;Sit to/from stand   Toilet Transfer: Supervision/safety;Ambulation;Comfort height toilet;Grab bars;RW   Toileting- Clothing Manipulation and Hygiene: Supervision/safety;Sit to/from stand       Functional mobility during ADLs: Supervision/safety;Rolling walker General ADL Comments: Pt provided with AE via indigent supply.  She was instructed in use of it for LB ADLs and was able to return demonstration        Vision                     Perception     Praxis      Cognition   Behavior During Therapy: [facility] for tasks assessed/performed Overall Cognitive Status: Within Functional Limits for tasks assessed                       Extremity/Trunk Assessment               Exercises     Shoulder Instructions       General Comments      Pertinent Vitals/ Pain       Pain Assessment: 0-10 Pain Score: 9  Pain Location: Rt hip/thigh Pain Descriptors / Indicators: Aching;Cramping Pain Intervention(s): Monitored during session;Repositioned;Ice applied;Premedicated before session  Home Living                                          Prior Functioning/Environment              Frequency Min 2X/week     Progress Toward Goals  OT Goals(current goals can now be found in the care plan section)     ADL Goals Pt Will Perform Lower Body Bathing: with supervision;sit to/from stand;with adaptive equipment Pt Will  Perform Lower Body Dressing: with supervision;with adaptive equipment;sit to/from stand Pt Will Transfer to Toilet: with supervision;ambulating;bedside commode Pt Will Perform Toileting - Clothing Manipulation and hygiene: with supervision;sit to/from stand Pt Will Perform Tub/Shower Transfer: with min assist;Tub transfer;tub bench  Plan Discharge plan remains appropriate    Co-evaluation                 End of Session Equipment Utilized During Treatment: Rolling walker   Activity Tolerance Patient limited by pain   Patient Left in chair;with call bell/phone within reach;with family/visitor present   Nurse Communication Mobility status        Time: [id number] OT Time Calculation (min): 44 min  Charges: OT General Charges $OT Visit: 1 Procedure OT Treatments $Self Care/Home Management : 23-37 mins $Therapeutic Activity: 8-22 mins  Topanga Alvelo M 04/06/2016, 10:34 AM

## 2016-04-07 LAB — CERVICOVAGINAL ANCILLARY ONLY: Wet Prep (BD Affirm): POSITIVE — AB

## 2016-04-07 NOTE — Progress Notes (Signed)
Subjective: 3 Days Post-Op Procedure(s) (LRB): RIGHT TOTAL HIP ARTHROPLASTY ANTERIOR APPROACH (Right) Patient reports pain as moderate.  Wanting to go home today only concern is weakness of right leg.   Objective: Vital signs in last 24 hours: Temp:  [98.7 F (37.1 C)-99.1 F (37.3 C)] 99.1 F (37.3 C) (04/24 0311) Pulse Rate:  [90-101] 90 (04/24 0311) Resp:  [18] 18 (04/24 0311) BP: (124-131)/(86-90) 131/86 mmHg (04/24 0311) SpO2:  [100 %] 100 % (04/24 0311)  Intake/Output from previous day: 04/23 0701 - 04/24 0700 In: 960 [P.O.:960] Out: -  Intake/Output this shift:     Recent Labs  04/05/16 0417  HGB 10.5*    Recent Labs  04/05/16 0417  WBC 12.3*  RBC 3.68*  HCT 31.8*  PLT 306    Recent Labs  04/05/16 0417  NA 142  K 3.9  CL 109  CO2 23  BUN 6  CREATININE 0.57  GLUCOSE 121*  CALCIUM 8.9   No results for input(s): LABPT, INR in the last 72 hours.  Right lower extremity:  Sensation intact distally Intact pulses distally Dorsiflexion/Plantar flexion intact Incision: dressing C/D/I Compartment soft  Assessment/Plan: 3 Days Post-Op Procedure(s) (LRB): RIGHT TOTAL HIP ARTHROPLASTY ANTERIOR APPROACH (Right) Up with therapy  Discharge to home later today after PT  CLARK, GILBERT 04/07/2016, 7:47 AM

## 2016-04-07 NOTE — Progress Notes (Signed)
Pt to d/c home with [place]. No DME needs, per pt. AVS reviewed and "My Chart" discussed with pt. Pt capable of verbalizing medications, signs and symptoms of infection, and follow-up appointments. Remains hemodynamically stable. No signs and symptoms of distress. Educated pt to return to ER in the case of SOB, dizziness, or chest pain.

## 2016-04-07 NOTE — Progress Notes (Signed)
During my shift assessment pt c/o numbness and tingling starting in the bottom of her right foot and radiating to her posterior knee. Pt right foot cool to touch, had good pulse of +2. Pt has weak dorsal flexion, stronger planter flexion.  Notified provider on call, no new orders taken.

## 2016-04-07 NOTE — Discharge Summary (Signed)
Patient ID: Kelsey Rodriguez MRN: [id number] DOB/AGE: 05/17/1991 25 y.o.  Admit date: 04/04/2016 Discharge date: 04/07/2016  Admission Diagnoses:  Principal Problem:   Avascular necrosis of bone of right hip [facility]) Active Problems:   Status post total replacement of right hip   Discharge Diagnoses:  Same  Past Medical History  Diagnosis Date  . Tuberculosis   . HIV (human immunodeficiency virus infection) ([place]) 02/2012  . Pelvic pain   . 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 Bell's palsy 08/26/2013  . Anemia of chronic disease 03/11/2012  . 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 Laceration of ankle, right 11/18/2012  . Reflux esophagitis 03/11/2012  . 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 Vertigo   . Chronic back pain   . Lumbar radiculopathy   . Chronic leg pain     bilateral knees, ankles  . Fatigue   . Adrenal insufficiency ([place])     Surgeries: Procedure(s): RIGHT TOTAL HIP ARTHROPLASTY ANTERIOR APPROACH on 04/04/2016   Consultants:  PT  Discharged Condition: Improved  Hospital Course: Kelsey Rodriguez is an 25 y.o. female who was admitted 04/04/2016 for operative treatment ofAvascular necrosis of bone of right hip ([place]). Patient has severe unremitting pain that affects sleep, daily activities, and work/hobbies. After pre-op clearance the patient was taken to the operating room on 04/04/2016 and underwent  Procedure(s): RIGHT TOTAL HIP ARTHROPLASTY ANTERIOR APPROACH.    Patient was given perioperative antibiotics: Anti-infectives    Start     Dose/Rate Route Frequency Ordered Stop   04/04/16 1700  Darunavir Ethanolate (PREZISTA) tablet 800 mg     800 mg Oral Daily with breakfast 04/04/16 1202     04/04/16 1700  elvitegravir-cobicistat-emtricitabine-tenofovir (GENVOYA) 150-150-200-10 MG tablet 1 tablet     1 tablet Oral Daily with breakfast 04/04/16 1202     04/04/16 1530  ceFAZolin (ANCEF) IVPB 1 g/50 mL premix     1 g 100 mL/hr over 30  Minutes Intravenous Every 6 hours 04/04/16 1202 04/04/16 2149   04/04/16 0915  ceFAZolin (ANCEF) IVPB 2 g/50 mL premix     2 g 100 mL/hr over 30 Minutes Intravenous  Once 04/04/16 0907 04/04/16 0941       Patient was given sequential compression devices, early ambulation, and chemoprophylaxis to prevent DVT.  Patient benefited maximally from hospital stay and there were no complications.    Recent vital signs: Patient Vitals for the past 24 hrs:  BP Temp Temp src Pulse Resp SpO2  04/07/16 0311 131/86 mmHg 99.1 F (37.3 C) Oral 90 18 100 %  04/06/16 2023 124/90 mmHg 98.7 F (37.1 C) Oral (!) 101 18 100 %     Recent laboratory studies:  Recent Labs  04/05/16 0417  WBC 12.3*  HGB 10.5*  HCT 31.8*  PLT 306  NA 142  K 3.9  CL 109  CO2 23  BUN 6  CREATININE 0.57  GLUCOSE 121*  CALCIUM 8.9     Discharge Medications:     Medication List    TAKE these medications        acetaminophen 500 MG tablet  Commonly known as:  TYLENOL  Take 500-1,000 mg by mouth every 6 (six) hours as needed for mild pain or headache.     aspirin 325 MG EC tablet  Take 1 tablet (325 mg total) by mouth 2 (two) times daily after a meal.     Darunavir Ethanolate 800 MG tablet  Commonly known as:  PREZISTA  Take 1 tablet (800 mg total) by mouth daily with breakfast.     elvitegravir-cobicistat-emtricitabine-tenofovir 150-150-200-10 MG Tabs tablet  Commonly known as:  GENVOYA  Take 1 tablet by mouth daily with breakfast.     hydrocortisone 5 MG tablet  Commonly known as:  CORTEF  Take 2 tablets in the morning and 1 tablet in the afternoon, no later than 6 PM     methocarbamol 500 MG tablet  Commonly known as:  ROBAXIN  Take 1 tablet (500 mg total) by mouth every 6 (six) hours as needed for muscle spasms.     oxyCODONE-acetaminophen 5-325 MG tablet  Commonly known as:  PERCOCET/ROXICET  Take 1-2 tablets by mouth 2 (two) times daily as needed for moderate pain or severe pain.         Diagnostic Studies: Dg C-arm 1-60 Min-no Report  04/04/2016  CLINICAL DATA: surgery C-ARM 1-60 MINUTES Fluoroscopy was utilized by the requesting physician.  No radiographic interpretation.   Dg Hip Port Unilat With Pelvis 1v Right  04/04/2016  CLINICAL DATA:  Status post right total hip replacement EXAM: DG C-ARM 1-60 MIN-NO REPORT; DG HIP   1V PORT RIGHT COMPARISON:  None. FINDINGS: Frontal lower pelvis and frontal right hip images obtained. There are total hip replacements bilaterally with prosthetic components bilaterally appearing well-seated. No acute fracture or dislocation. Air on the right is an expected postoperative finding. IMPRESSION: Total hip replacements bilaterally with prosthetic components appearing well-seated. No acute fracture or dislocation evident. Electronically Signed   By: Lowella Grip III M.D.   On: 04/04/2016 11:20   Dg Hip Operative Unilat With Pelvis Right  04/04/2016  CLINICAL DATA:  Intra op right side anterior approach total hip replacement. Hx of OA. Imaging done in [facility] OR Rm 7. EXAM: OPERATIVE RIGHT HIP (WITH PELVIS IF PERFORMED) 2 VIEWS TECHNIQUE: Fluoroscopic spot image(s) were submitted for interpretation post-operatively. COMPARISON:  12/14/2015 FINDINGS: Interval right hip arthroplasty. Components project in expected location. No fracture or dislocation. Left hip arthroplasty components are partially visualized, stable in appearance. Left pelvic phleboliths noted. IMPRESSION: 1. Right hip arthroplasty without apparent complication. Electronically Signed   By: Lucrezia Europe M.D.   On: 04/04/2016 10:46    Disposition: 06-Home-Health Care Svc        Follow-up Information    Follow up with Mcarthur Rossetti, MD In 2 weeks.   Specialty:  Orthopedic Surgery   Contact information:   [place] [address] [phone]       Follow up with [facility].   Why:  Home Health Physical Therapy   Contact  information:   [address] [place] [address] [phone]        Signed: Erskine Emery 04/07/2016, 7:52 AM

## 2016-04-07 NOTE — Progress Notes (Signed)
Physical Therapy Treatment Patient Details Name: Kelsey Rodriguez MRN: [id number] DOB: Sep 30, 1991 Today's Date: 04/07/2016    History of Present Illness Pt is s/p R THA-DA with noted R foot/drop post op.  Pt with history of L THA in 11/2015, anemia, HIV, chronic back pain, vertigo.     PT Comments    Pt scheduled to dc today.  Practiced stairs with rail and crutch. Pt with good recall of technique from last surgery.  Reviewed how to to have family A.  Follow Up Recommendations  Home health PT     Equipment Recommendations  None recommended by PT    Recommendations for Other Services       Precautions / Restrictions Precautions Precautions: Fall Precaution Comments: R foot drop post op.  Dr Ninfa Linden encouraging knee flex at rest to reduce stress on Peroneal Nerve--reinforced with pt Required Braces or Orthoses: Other Brace/Splint Other Brace/Splint: has ASO, no shoes with her currently--reviewed importance of wearing shoe with ASO (removed after therapy once pt back in bed  d/t significant compression Restrictions Weight Bearing Restrictions: No Other Position/Activity Restrictions: WBAT    Mobility  Bed Mobility   Bed Mobility: Supine to Sit     Supine to sit: Supervision     General bed mobility comments: Using UE to A with R LE  Transfers Overall transfer level: Needs assistance Equipment used: Rolling walker (2 wheeled) Transfers: Sit to/from Stand Sit to Stand: Supervision            Ambulation/Gait Ambulation/Gait assistance: Supervision Ambulation Distance (Feet): 200 Feet Assistive device: Rolling walker (2 wheeled) Gait Pattern/deviations: Step-to pattern;Trendelenburg Gait velocity: decr   General Gait Details: Pt with R hip hike to A with clearing of R LE although pt reports it is getting better   Stairs Stairs: Yes Stairs assistance: Min assist Stair Management: One rail Left;With crutches;One rail Right Number of Stairs: 4 General stair  comments: Descent with rail on L and crutch on R.  Difficulty getting R LE off of step, but able to without physical A.  Ascent with rail on R and crutch in L with cues to flex knees to A in getting R LE up due to decreased df.  tactile cues 50% of time and educated on how family can A pt and she verbalized understanding.  Wheelchair Mobility    Modified Rankin (Stroke Patients Only)       Balance Overall balance assessment: No apparent balance deficits (not formally assessed)                                  Cognition Arousal/Alertness: Awake/alert Behavior During Therapy: WFL for tasks assessed/performed Overall Cognitive Status: Within Functional Limits for tasks assessed                      Exercises Total Joint Exercises Ankle Circles/Pumps: Both;PROM;AROM;10 reps;Supine;Limitations Ankle Circles/Pumps Limitations: can wiggle toes, but continued with decreased R df 1/5 Quad Sets: Strengthening;Right;10 reps;Supine Heel Slides: AAROM;Both;Right Heel Slides Limitations: pain Hip ABduction/ADduction: AAROM;Right;10 reps;Supine    General Comments        Pertinent Vitals/Pain Pain Assessment: 0-10 Pain Score: 10-Worst pain ever Pain Location: R hip/thigh Pain Descriptors / Indicators: Aching;Operative site guarding;Grimacing Pain Intervention(s): Limited activity within patient's tolerance;Premedicated before session;Ice applied    Home Living  Prior Function            PT Goals (current goals can now be found in the care plan section) Acute Rehab PT Goals Patient Stated Goal: wants to return to independence, concerned about foot drop PT Goal Formulation: With patient Time For Goal Achievement: 04/10/16 Potential to Achieve Goals: Good Progress towards PT goals: Progressing toward goals    Frequency  7X/week    PT Plan Current plan remains appropriate    Co-evaluation             End of Session  Equipment Utilized During Treatment: Gait belt Activity Tolerance: Patient tolerated treatment well Patient left: in chair;with call bell/phone within reach     Time: 0903-0933 PT Time Calculation (min) (ACUTE ONLY): 30 min  Charges:  $Gait Training: 23-37 mins                    G Codes:      Mantaj Chamberlin LUBECK 04/07/2016, 9:43 AM

## 2016-04-10 ENCOUNTER — Ambulatory Visit: Payer: Self-pay | Admitting: Internal Medicine

## 2016-04-11 ENCOUNTER — Encounter: Payer: Self-pay | Admitting: *Deleted

## 2016-04-11 ENCOUNTER — Encounter: Payer: Self-pay | Admitting: Internal Medicine

## 2016-04-11 ENCOUNTER — Other Ambulatory Visit: Payer: Self-pay | Admitting: *Deleted

## 2016-04-11 DIAGNOSIS — N760 Acute vaginitis: Principal | ICD-10-CM

## 2016-04-11 DIAGNOSIS — B9689 Other specified bacterial agents as the cause of diseases classified elsewhere: Secondary | ICD-10-CM

## 2016-04-11 MED ORDER — METRONIDAZOLE 500 MG PO TABS: 500.0000 mg | ORAL_TABLET | Freq: Two times a day (BID) | ORAL | Status: DC

## 2016-04-17 ENCOUNTER — Telehealth: Payer: Self-pay | Admitting: *Deleted

## 2016-04-17 NOTE — Telephone Encounter (Signed)
Patient states she needs a letter from MD stating that she has been out of work since April of last year and is currently out of work. Also that she recently had surgery. States she need this letter to take to social services, please contact patient with any questions.

## 2016-04-21 NOTE — Telephone Encounter (Signed)
See letter written today. This should be sent to the patient and left up front for pick up at her earliest convenience.   Saathvik Every B. Bonner Puna, MD, PGY-3 04/21/2016 10:37 PM

## 2016-04-22 ENCOUNTER — Encounter: Payer: Self-pay | Admitting: Family Medicine

## 2016-04-22 ENCOUNTER — Telehealth: Payer: Self-pay | Admitting: Family Medicine

## 2016-04-22 NOTE — Telephone Encounter (Signed)
Form placed in PCP's box

## 2016-04-22 NOTE — Telephone Encounter (Signed)
Patient asks PCP to complete DMV Form. Please, follow up.

## 2016-04-22 NOTE — Telephone Encounter (Signed)
Pt informed. She is on her way to pick up letter. Ottis Stain, CMA

## 2016-04-23 NOTE — Telephone Encounter (Signed)
Placard application completed and left in Kelsey Rodriguez's box.

## 2016-04-23 NOTE — Telephone Encounter (Signed)
Patient informed that placard form is complete and ready for pick up.  Quanta Robertshaw L, RN  

## 2016-05-09 ENCOUNTER — Encounter (HOSPITAL_COMMUNITY): Payer: Self-pay | Admitting: Emergency Medicine

## 2016-05-09 ENCOUNTER — Emergency Department (HOSPITAL_COMMUNITY)
Admission: EM | Admit: 2016-05-09 | Discharge: 2016-05-09 | Disposition: A | Discharge status: Home / Self Care | Payer: No Typology Code available for payment source | Attending: Emergency Medicine | Admitting: Emergency Medicine

## 2016-05-09 DIAGNOSIS — R112 Nausea with vomiting, unspecified: Secondary | ICD-10-CM | POA: Insufficient documentation

## 2016-05-09 DIAGNOSIS — Z7982 Long term (current) use of aspirin: Secondary | ICD-10-CM | POA: Insufficient documentation

## 2016-05-09 DIAGNOSIS — Z21 Asymptomatic human immunodeficiency virus [HIV] infection status: Secondary | ICD-10-CM | POA: Insufficient documentation

## 2016-05-09 DIAGNOSIS — R1084 Generalized abdominal pain: Secondary | ICD-10-CM | POA: Insufficient documentation

## 2016-05-09 DIAGNOSIS — Z79899 Other long term (current) drug therapy: Secondary | ICD-10-CM | POA: Insufficient documentation

## 2016-05-09 DIAGNOSIS — R42 Dizziness and giddiness: Secondary | ICD-10-CM | POA: Insufficient documentation

## 2016-05-09 DIAGNOSIS — Z96643 Presence of artificial hip joint, bilateral: Secondary | ICD-10-CM | POA: Insufficient documentation

## 2016-05-09 DIAGNOSIS — R109 Unspecified abdominal pain: Secondary | ICD-10-CM

## 2016-05-09 LAB — URINALYSIS, ROUTINE W REFLEX MICROSCOPIC
Bilirubin Urine: NEGATIVE
Glucose, UA: NEGATIVE mg/dL
Ketones, ur: NEGATIVE mg/dL
Leukocytes, UA: NEGATIVE
Nitrite: NEGATIVE
Protein, ur: NEGATIVE mg/dL
Specific Gravity, Urine: 1.017 (ref 1.005–1.030)
pH: 8 (ref 5.0–8.0)

## 2016-05-09 LAB — CBC WITH DIFFERENTIAL/PLATELET
Basophils Absolute: 0 10*3/uL (ref 0.0–0.1)
Basophils Relative: 1 %
Eosinophils Absolute: 0.1 10*3/uL (ref 0.0–0.7)
Eosinophils Relative: 1 %
HCT: 35.5 % — ABNORMAL LOW (ref 36.0–46.0)
Hemoglobin: 11.3 g/dL — ABNORMAL LOW (ref 12.0–15.0)
Lymphocytes Relative: 40 %
Lymphs Abs: 2.5 10*3/uL (ref 0.7–4.0)
MCH: 27.6 pg (ref 26.0–34.0)
MCHC: 31.8 g/dL (ref 30.0–36.0)
MCV: 86.6 fL (ref 78.0–100.0)
Monocytes Absolute: 0.6 10*3/uL (ref 0.1–1.0)
Monocytes Relative: 10 %
Neutro Abs: 3 10*3/uL (ref 1.7–7.7)
Neutrophils Relative %: 48 %
Platelets: 363 10*3/uL (ref 150–400)
RBC: 4.1 MIL/uL (ref 3.87–5.11)
RDW: 16.7 % — ABNORMAL HIGH (ref 11.5–15.5)
WBC: 6.1 10*3/uL (ref 4.0–10.5)

## 2016-05-09 LAB — COMPREHENSIVE METABOLIC PANEL
ALT: 14 U/L (ref 14–54)
AST: 20 U/L (ref 15–41)
Albumin: 3.8 g/dL (ref 3.5–5.0)
Alkaline Phosphatase: 98 U/L (ref 38–126)
Anion gap: 8 (ref 5–15)
BUN: <5 mg/dL — ABNORMAL LOW (ref 6–20)
CO2: 25 mmol/L (ref 22–32)
Calcium: 9.4 mg/dL (ref 8.9–10.3)
Chloride: 107 mmol/L (ref 101–111)
Creatinine, Ser: 0.81 mg/dL (ref 0.44–1.00)
GFR calc Af Amer: >60 mL/min (ref >60)
GFR calc non Af Amer: >60 mL/min (ref >60)
Glucose, Bld: 99 mg/dL (ref 65–99)
Potassium: 3.7 mmol/L (ref 3.5–5.1)
Sodium: 140 mmol/L (ref 135–145)
Total Bilirubin: 0.4 mg/dL (ref 0.3–1.2)
Total Protein: 8.1 g/dL (ref 6.5–8.1)

## 2016-05-09 LAB — LIPASE, BLOOD: Lipase: 20 U/L (ref 11–51)

## 2016-05-09 LAB — URINE MICROSCOPIC-ADD ON

## 2016-05-09 LAB — I-STAT BETA HCG BLOOD, ED (MC, WL, AP ONLY): I-stat hCG, quantitative: <5 m[IU]/mL (ref <5)

## 2016-05-09 MED ORDER — DEXAMETHASONE SODIUM PHOSPHATE 4 MG/ML IJ SOLN
4.0000 mg | Freq: Once | INTRAMUSCULAR | Status: AC
  Administered 2016-05-09: 4 mg via INTRAVENOUS
  Filled 2016-05-09: qty 1

## 2016-05-09 MED ORDER — ONDANSETRON 4 MG PO TBDP: 4.0000 mg | ORAL_TABLET | Freq: Three times a day (TID) | ORAL | Status: DC | PRN

## 2016-05-09 MED ORDER — SODIUM CHLORIDE 0.9 % IV BOLUS (SEPSIS)
1000.0000 mL | Freq: Once | INTRAVENOUS | Status: AC
  Administered 2016-05-09: 1000 mL via INTRAVENOUS

## 2016-05-09 MED ORDER — FENTANYL CITRATE (PF) 100 MCG/2ML IJ SOLN
50.0000 ug | Freq: Once | INTRAMUSCULAR | Status: AC
  Administered 2016-05-09: 50 ug via INTRAVENOUS
  Filled 2016-05-09: qty 2

## 2016-05-09 MED ORDER — ACETAMINOPHEN 325 MG PO TABS: 650.0000 mg | ORAL_TABLET | Freq: Four times a day (QID) | ORAL | Status: DC | PRN

## 2016-05-09 MED ORDER — ONDANSETRON HCL 4 MG/2ML IJ SOLN
4.0000 mg | Freq: Once | INTRAMUSCULAR | Status: AC
  Administered 2016-05-09: 4 mg via INTRAVENOUS
  Filled 2016-05-09: qty 2

## 2016-05-09 NOTE — ED Notes (Signed)
Pt states she started vomiting yesterday around 2200 and didn't stop the whole night, pt having dizziness and 6/10 abd pain, denies fevers, chills or diarrhea.

## 2016-05-09 NOTE — ED Provider Notes (Signed)
CSN: [id number]     Arrival date & time 05/09/16  0609 History   First MD Initiated Contact with Patient 05/09/16 0622     Chief Complaint  Patient presents with  . Nausea  . Emesis  . Dizziness   HPI  Kelsey Rodriguez is an 25 y.o. female with history of HIV, AVN, adrenal insufficiency, TB, who presents to the ED for evaluation of abdominal pain and N/V. She states she has had left sided abdominal pain intermittently for the past week. Around 10 PM last night she started feeling nauseated and has had "countless" episodes of NBNB emesis overnight. She denies fever, chills, diarrhea, urinary symptoms. She states she missed her evening dose of her cortef at home due to inability to tolerate PO last night. She has not tried anything to alleviate her symptoms. Denies recent travel, new foods. She does not want any pain medicine today  Past Medical History  Diagnosis Date  . Tuberculosis   . HIV (human immunodeficiency virus infection) ([place]) 02/2012  . Pelvic pain   . 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 Bell's palsy 08/26/2013  . Anemia of chronic disease 03/11/2012  . 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 Laceration of ankle, right 11/18/2012  . Reflux esophagitis 03/11/2012  . 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 Vertigo   . Chronic back pain   . Lumbar radiculopathy   . Chronic leg pain     bilateral knees, ankles  . Fatigue   . Adrenal insufficiency [facility])    Past Surgical History  Procedure Laterality Date  . Esophagogastroduodenoscopy  03/11/2012    Procedure: ESOPHAGOGASTRODUODENOSCOPY (EGD);  Surgeon: Lafayette Dragon, MD;  Location: [facility] ENDOSCOPY;  Service: Endoscopy;  Laterality: N/A;  . Appendectomy  ~ 2000  . Lung biopsy  02/2012  . Dilation and curettage of uterus  2008  . Esophagogastroduodenoscopy N/A 03/07/2014    Procedure: ESOPHAGOGASTRODUODENOSCOPY (EGD);  Surgeon: Gatha Mayer, MD;  Location: [facility] ENDOSCOPY;  Service: Endoscopy;   Laterality: N/A;  . Total hip arthroplasty Left 12/14/2015    Procedure: LEFT TOTAL HIP ARTHROPLASTY ANTERIOR APPROACH;  Surgeon: Mcarthur Rossetti, MD;  Location: [facility] ORS;  Service: Orthopedics;  Laterality: Left;  . Total hip arthroplasty Right 04/04/2016    Procedure: RIGHT TOTAL HIP ARTHROPLASTY ANTERIOR APPROACH;  Surgeon: Mcarthur Rossetti, MD;  Location: [facility] ORS;  Service: Orthopedics;  Laterality: Right;   Family History  Problem Relation Age of Onset  . Heart disease Father     Vague not clearly cardiac   Social History  Substance Use Topics  . Smoking status: Never Smoker   . Smokeless tobacco: Never Used  . Alcohol Use: No     Comment: socially   OB History    Gravida Para Term Preterm AB TAB SAB Ectopic Multiple Living   1 0 0 0 1 0 1 0 0 0      Review of Systems  All other systems reviewed and are negative.     Allergies  Hydrocodone and Tramadol  Home Medications   Prior to Admission medications   Medication Sig Start Date End Date Taking? Authorizing Provider  acetaminophen (TYLENOL) 500 MG tablet Take 500-1,000 mg by mouth every 6 (six) hours as needed for mild pain or headache.    Historical Provider, MD  aspirin EC 325 MG EC tablet Take 1 tablet (325 mg total) by mouth 2 (two) times daily after a meal. 04/05/16   Mcarthur Rossetti,  MD  Darunavir Ethanolate (PREZISTA) 800 MG tablet Take 1 tablet (800 mg total) by mouth daily with breakfast. 02/19/16   Carlyle Basques, MD  elvitegravir-cobicistat-emtricitabine-tenofovir (GENVOYA) 150-150-200-10 MG TABS tablet Take 1 tablet by mouth daily with breakfast. 02/19/16   Carlyle Basques, MD  hydrocortisone (CORTEF) 5 MG tablet Take 2 tablets in the morning and 1 tablet in the afternoon, no later than 6 PM 06/28/15   Philemon Kingdom, MD  methocarbamol (ROBAXIN) 500 MG tablet Take 1 tablet (500 mg total) by mouth every 6 (six) hours as needed for muscle spasms. 04/05/16   Mcarthur Rossetti, MD  metroNIDAZOLE  (FLAGYL) 500 MG tablet Take 1 tablet (500 mg total) by mouth 2 (two) times daily. 04/11/16   Carlyle Basques, MD  oxyCODONE-acetaminophen (PERCOCET/ROXICET) 5-325 MG tablet Take 1-2 tablets by mouth 2 (two) times daily as needed for moderate pain or severe pain. 04/05/16   Mcarthur Rossetti, MD   BP 136/97 mmHg  Pulse 90  Temp(Src) 98 F (36.7 C) (Oral)  Resp 19  Ht 5\' 5"  (1.651 m)  Wt 69.854 kg  BMI 25.63 kg/m2  SpO2 100% Physical Exam  Constitutional: She is oriented to person, place, and time.  Appears uncomfortable, emesis bag with NBNB emesis at bedside  HENT:  Right Ear: External ear normal.  Left Ear: External ear normal.  Nose: Nose normal.  Mouth/Throat: Oropharynx is clear and moist. No oropharyngeal exudate.  Eyes: Conjunctivae and EOM are normal. Pupils are equal, round, and reactive to light.  Neck: Normal range of motion. Neck supple.  Cardiovascular: Normal rate, regular rhythm, normal heart sounds and intact distal pulses.   Pulmonary/Chest: Effort normal and breath sounds normal. No respiratory distress. She has no wheezes. She exhibits no tenderness.  Abdominal: Soft. Bowel sounds are normal. She exhibits no distension. There is no rebound and no guarding.  Mild generalized left sided abdominal ttp without rebound or guarding. No CVA tenderness  Musculoskeletal: She exhibits no edema.  Neurological: She is alert and oriented to person, place, and time. No cranial nerve deficit.  Skin: Skin is warm and dry.  Psychiatric: She has a normal mood and affect.  Nursing note and vitals reviewed.   ED Course  Procedures (including critical care time) Labs Review Labs Reviewed  COMPREHENSIVE METABOLIC PANEL - Abnormal; Notable for the following:    BUN <5 (*)    All other components within normal limits  CBC WITH DIFFERENTIAL/PLATELET - Abnormal; Notable for the following:    Hemoglobin 11.3 (*)    HCT 35.5 (*)    RDW 16.7 (*)    All other components within  normal limits  URINALYSIS, ROUTINE W REFLEX MICROSCOPIC (NOT AT [facility]) - Abnormal; Notable for the following:    APPearance TURBID (*)    Hgb urine dipstick LARGE (*)    All other components within normal limits  URINE MICROSCOPIC-ADD ON - Abnormal; Notable for the following:    Squamous Epithelial / LPF 6-30 (*)    Bacteria, UA MANY (*)    All other components within normal limits  LIPASE, BLOOD  I-STAT BETA HCG BLOOD, ED ([facility], [facility], [facility] ONLY)   Lab Results  Component Value Date   CD4TABS 460 01/10/2016   CD4TABS 620 09/13/2015   CD4TABS 460 03/21/2015   Lab Results  Component Value Date   HIV1RNAQUANT 35* 01/10/2016     Imaging Review No results found. I have personally reviewed and evaluated these images and lab results as part of my  medical decision-making.   EKG Interpretation None      MDM   Final diagnoses:  Non-intractable vomiting with nausea, vomiting of unspecified type  Left sided abdominal pain   Pt's pain and nausea much improved in the ED. She is tolerating PO. Her labs are unremarkable. Anemia at baseline. UA with 6-30 WBC, many bacteria, large hemoglobin. Given lack of urinary symptoms will hold off on antibiotic therapy of asymptomatic bacturia and send urine for culture. Suspect likely viral etiology. Instructed close PCP f/u. Encouraged close endocrine and ID follow up as well. Rx given for supportive meds. ER return precautions given.     Anne Ng, PA-C 05/09/16 [id number]  Quintella Reichert, MD 05/11/16 [phone]

## 2016-05-09 NOTE — ED Notes (Signed)
Pt. Able to drink and eat without nausea/vomiting.

## 2016-05-09 NOTE — Discharge Instructions (Signed)
Please call your primary care provider to schedule a follow up appointment as soon as possible. Return to the emergency room for new or worsening symptoms.

## 2016-05-10 LAB — URINE CULTURE

## 2016-05-16 ENCOUNTER — Ambulatory Visit (INDEPENDENT_AMBULATORY_CARE_PROVIDER_SITE_OTHER): Payer: Self-pay | Admitting: Family Medicine

## 2016-05-16 ENCOUNTER — Encounter: Payer: Self-pay | Admitting: Family Medicine

## 2016-05-16 ENCOUNTER — Ambulatory Visit (HOSPITAL_COMMUNITY)
Admission: RE | Admit: 2016-05-16 | Discharge: 2016-05-16 | Disposition: A | Discharge status: Home / Self Care | Payer: Self-pay | Source: Ambulatory Visit | Attending: Family Medicine | Admitting: Family Medicine

## 2016-05-16 VITALS — BP 119/77 | HR 84 | Temp 98.6°F | Ht 65.0 in | Wt 160.8 lb

## 2016-05-16 DIAGNOSIS — R0789 Other chest pain: Secondary | ICD-10-CM

## 2016-05-16 DIAGNOSIS — G8928 Other chronic postprocedural pain: Secondary | ICD-10-CM | POA: Insufficient documentation

## 2016-05-16 MED ORDER — LIDOCAINE 5 % EX PTCH: 1.0000 | MEDICATED_PATCH | CUTANEOUS | Status: DC

## 2016-05-16 NOTE — Progress Notes (Signed)
Subjective: Kelsey Rodriguez is a 25 y.o. female presenting for chest pain.   She continues to suffer from chest pain that began several years ago following a left lung biopsy. Since that time she has noticed intermittent, sharp and aching pain that is often debilitating, sometimes keeping her from sleeping. It radiates to her left shoulder, and is minimally improved with opioids she has been taking s/p THA. It occurs mostly when she aggravates it by strenuous use of her left arm (e.g. pushing carts, mopping, opening doors) and varies from mild to severe. The most troublesome aspect is when it keeps her from sleep, as it is also worsened by laying on the left side. Exertion, like jogging at PT does not make it worse.   - ROS: She denies trouble breathing, cough, hemoptysis, neck pain.  - Non-smoker  Objective: BP 119/77 mmHg  Pulse 84  Temp(Src) 98.6 F (37 C) (Oral)  Ht 5\' 5"  (1.651 m)  Wt 160 lb 12.8 oz (72.938 kg)  BMI 26.76 kg/m2 Gen: Well-appearing 25 y.o. female in no distress CV: Regular rate, no murmur; radial, DP and PT pulses 2+ bilaterally; no LE edema, no JVD, cap refill < 2 sec. Pulm: Non-labored breathing ambient air; CTAB, no wheezes or crackles. Chest: ~6cm linear transverse hypertrophic surgical scar over left chest wall. Chest, neck and shoulder normal to inspection. No tenderness or spasm of upper left chest wall, trapezius, or shoulder. Shoulder with full AROM, negative empty can, lift-off. Pain reproduced on palpation of chest wall overlaying scar and with resisted motion involving pectoralis muscles. Negative spurlings, neck with full ROM.   Assessment/Plan: Kelsey Rodriguez is a 25 y.o. female here for chest wall pain s/p lung Bx.  Chest pain Likely related to scar tissue formation/neural damage with lung biopsy. Will repeat CXR and if negative, and pain uncontrolled, may require evaluation by CVTS.  - Lidoderm patches for pain - Could consider gabapentin, especially qHS to  help with sleep, though this is not a classically neuropathic-type pain.

## 2016-05-16 NOTE — Assessment & Plan Note (Signed)
Likely related to scar tissue formation/neural damage with lung biopsy. Will repeat CXR and if negative, and pain uncontrolled, may require evaluation by CVTS.  - Lidoderm patches for pain - Could consider gabapentin, especially qHS to help with sleep, though this is not a classically neuropathic-type pain.

## 2016-05-16 NOTE — Patient Instructions (Signed)
Thank you for coming in today!   We are checking an XRAY today, and we'll call you with the results.  - Go get the lidoderm patches and apply them to the area as directed.  - If you develop middle of the chest pain or trouble breathing please seek medical attention.   Our clinic's number is [phone]. Feel free to call any time with questions or concerns. We will answer any questions after hours with our 24-hour emergency line at that number as well.   - Dr. Bonner Puna

## 2016-05-18 ENCOUNTER — Telehealth: Payer: Self-pay | Admitting: Family Medicine

## 2016-05-18 NOTE — Telephone Encounter (Signed)
CXR negative on my interpretation.   Dg Chest 2 View  05/16/2016  CLINICAL DATA:  Chronic left-sided chest pain and shortness breath, which has become more severe in past 2 days. EXAM: CHEST  2 VIEW COMPARISON:  Multiple prior studies dating back to 07/08/2013 FINDINGS: The heart size and mediastinal contours are within normal limits. No evidence of pulmonary infiltrate or pleural effusion. Asymmetric nodular density in left upper lobe remains stable compared to multiple prior radiographs and CTs, and consistent with benign etiology. IMPRESSION: Stable exam.  No active cardiopulmonary disease. Electronically Signed   By: Earle Gell M.D.   On: 05/16/2016 17:33   Will continue to follow clinically with lidoderm patch. If continues to be bothersome, we can see if cardiovascular/thoracic surgery has any insights into this pain.

## 2016-05-19 ENCOUNTER — Ambulatory Visit (INDEPENDENT_AMBULATORY_CARE_PROVIDER_SITE_OTHER): Payer: Self-pay | Admitting: *Deleted

## 2016-05-19 DIAGNOSIS — Z3042 Encounter for surveillance of injectable contraceptive: Secondary | ICD-10-CM

## 2016-05-19 DIAGNOSIS — B20 Human immunodeficiency virus [HIV] disease: Secondary | ICD-10-CM

## 2016-05-19 MED ORDER — MEDROXYPROGESTERONE ACETATE 150 MG/ML IM SUSP
150.0000 mg | Freq: Once | INTRAMUSCULAR | Status: AC
  Administered 2016-05-19: 150 mg via INTRAMUSCULAR

## 2016-05-19 NOTE — Progress Notes (Signed)
Patient will schedule labs and PAP for 05/23/16.  Pt declined condoms, stating "I still have plenty." Lavone Neri, Lanice Schwab, RN

## 2016-05-19 NOTE — Telephone Encounter (Signed)
Pt informed. Sharon T Saunders, CMA  

## 2016-05-23 ENCOUNTER — Other Ambulatory Visit (INDEPENDENT_AMBULATORY_CARE_PROVIDER_SITE_OTHER): Payer: Self-pay

## 2016-05-23 ENCOUNTER — Ambulatory Visit (INDEPENDENT_AMBULATORY_CARE_PROVIDER_SITE_OTHER): Payer: Self-pay | Admitting: *Deleted

## 2016-05-23 DIAGNOSIS — Z124 Encounter for screening for malignant neoplasm of cervix: Secondary | ICD-10-CM

## 2016-05-23 DIAGNOSIS — Z113 Encounter for screening for infections with a predominantly sexual mode of transmission: Secondary | ICD-10-CM

## 2016-05-23 DIAGNOSIS — B20 Human immunodeficiency virus [HIV] disease: Secondary | ICD-10-CM

## 2016-05-23 LAB — T-HELPER CELL (CD4) - (RCID CLINIC ONLY)
CD4 % Helper T Cell: 29 % — ABNORMAL LOW (ref 33–55)
CD4 T Cell Abs: 750 /uL (ref 400–2700)

## 2016-05-23 NOTE — Progress Notes (Signed)
  Subjective:     Kelsey Rodriguez is a 25 y.o. woman who comes in today for a  pap smear only.  Previous abnormal Pap smears: no. Contraception: injection.  Pt complained of vaginal dryness and difficult/painful intercourse.  Pt given bag of lubricant  Objective:    There were no vitals taken for this visit. Pelvic Exam:  Pap smear obtained. brownish vaginal discharge.  Assessment:    Screening pap smear.   Plan:    Follow up in one year, or as indicated by Pap results.   Pt given educational materials re: HIV and women, self-esteem, BSE, nutrition and diet management, PAP smears and partner safety. Pt given condoms

## 2016-05-24 LAB — RPR TITER: RPR Titer: 1:1 {titer}

## 2016-05-24 LAB — RPR: RPR Ser Ql: REACTIVE — AB

## 2016-05-26 LAB — HIV-1 RNA QUANT-NO REFLEX-BLD
HIV 1 RNA Quant: <20 copies/mL (ref <20)
HIV-1 RNA Quant, Log: <1.3 Log copies/mL (ref <1.30)

## 2016-05-26 LAB — CERVICOVAGINAL ANCILLARY ONLY
Chlamydia: NEGATIVE
Neisseria Gonorrhea: NEGATIVE

## 2016-05-26 LAB — CYTOLOGY - PAP

## 2016-05-27 LAB — FLUORESCENT TREPONEMAL AB(FTA)-IGG-BLD: Fluorescent Treponemal ABS: NONREACTIVE

## 2016-05-28 ENCOUNTER — Ambulatory Visit (INDEPENDENT_AMBULATORY_CARE_PROVIDER_SITE_OTHER): Payer: Self-pay | Admitting: Internal Medicine

## 2016-05-28 ENCOUNTER — Encounter: Payer: Self-pay | Admitting: Internal Medicine

## 2016-05-28 VITALS — BP 121/80 | HR 89 | Temp 98.7°F | Wt 163.0 lb

## 2016-05-28 DIAGNOSIS — B3731 Acute candidiasis of vulva and vagina: Secondary | ICD-10-CM

## 2016-05-28 DIAGNOSIS — M25551 Pain in right hip: Secondary | ICD-10-CM

## 2016-05-28 DIAGNOSIS — B373 Candidiasis of vulva and vagina: Secondary | ICD-10-CM

## 2016-05-28 DIAGNOSIS — H8110 Benign paroxysmal vertigo, unspecified ear: Secondary | ICD-10-CM

## 2016-05-28 DIAGNOSIS — A539 Syphilis, unspecified: Secondary | ICD-10-CM

## 2016-05-28 DIAGNOSIS — B20 Human immunodeficiency virus [HIV] disease: Secondary | ICD-10-CM

## 2016-05-28 MED ORDER — PENICILLIN G BENZATHINE 1200000 UNIT/2ML IM SUSP
1.2000 10*6.[IU] | Freq: Once | INTRAMUSCULAR | Status: AC
  Administered 2016-05-28: 1.2 10*6.[IU] via INTRAMUSCULAR

## 2016-05-28 MED ORDER — FLUCONAZOLE 150 MG PO TABS: 150.0000 mg | ORAL_TABLET | Freq: Every day | ORAL | Status: DC

## 2016-05-28 MED ORDER — MECLIZINE HCL 32 MG PO TABS: 32.0000 mg | ORAL_TABLET | Freq: Three times a day (TID) | ORAL | Status: DC | PRN

## 2016-05-28 NOTE — Progress Notes (Signed)
Patient ID: Kelsey Rodriguez, female   DOB: 11/07/1991, 25 y.o.   MRN: [id number]       Patient ID: Kelsey Rodriguez, female   DOB: 07-Feb-1991, 25 y.o.   MRN: [id number]  HPI 25yo F with HIV disease, well controlled, dx in the setting of pulm TB, c/b adrenal insufficiency. 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 She states that she is slowly improving, still feels stiff, takes some pain medication. Since being discharged from hospital, she reports having 3 episodes of vertigo, one led her to the ED. She also still notices some referred chest pain for which her PCP worked up for atypical chest pain. She was given rx for lidocaine patch but unable to fill due to cost. She still does not have health insurance.  Outpatient Encounter Prescriptions as of 05/28/2016  Medication Sig  . acetaminophen (TYLENOL) 500 MG tablet Take 500-1,000 mg by mouth every 6 (six) hours as needed for mild pain or headache.  . Darunavir Ethanolate (PREZISTA) 800 MG tablet Take 1 tablet (800 mg total) by mouth daily with breakfast.  . elvitegravir-cobicistat-emtricitabine-tenofovir (GENVOYA) 150-150-200-10 MG TABS tablet Take 1 tablet by mouth daily with breakfast.  . hydrocortisone (CORTEF) 5 MG tablet Take 2 tablets in the morning and 1 tablet in the afternoon, no later than 6 PM  . lidocaine (LIDODERM) 5 % Place 1 patch onto the skin daily. Remove & Discard patch within 12 hours or as directed by MD  . medroxyPROGESTERone (DEPO-PROVERA) 150 MG/ML injection Inject 150 mg into the muscle every 3 (three) months.  [name] oxyCODONE-acetaminophen (PERCOCET/ROXICET) 5-325 MG tablet Take 1-2 tablets by mouth 2 (two) times daily as needed for moderate pain or severe pain.  . [DISCONTINUED] methocarbamol (ROBAXIN) 500 MG tablet Take 1 tablet (500 mg total) by mouth every 6 (six) hours as needed for muscle spasms.  [name] acetaminophen (TYLENOL) 325 MG tablet Take 2 tablets (650 mg total) by mouth every 6 (six)  hours as needed. (Patient not taking: Reported on 05/28/2016)  . ondansetron (ZOFRAN ODT) 4 MG disintegrating tablet Take 1 tablet (4 mg total) by mouth every 8 (eight) hours as needed for nausea or vomiting. (Patient not taking: Reported on 05/28/2016)   No facility-administered encounter medications on file as of 05/28/2016.     Patient Active Problem List   Diagnosis Date Noted  . 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 Avascular necrosis of bone of right hip ([place]) 04/04/2016  . 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 Avascular necrosis of bone of left hip ([place]) 12/14/2015  . 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 Nausea with vomiting 05/18/2015  . Overweight (BMI 25.0-29.9) 04/20/2015  . Seasonal allergies 03/20/2015  . Menorrhagia 03/19/2015  . Back pain of lumbar region with sciatica 02/12/2015  . Dizziness and giddiness 01/23/2015  . Primary adrenal insufficiency ([place]) 01/03/2015  . Fatigue 09/25/2014  . Chest pain 07/07/2013  . Laceration of ankle, right 11/18/2012  . 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 Vaginal discharge 05/30/2012  . Arthralgia 05/20/2012  . HIV (human immunodeficiency virus infection) ([place]) 03/16/2012  . Dysphagia 03/11/2012  . 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 Iron deficiency anemia 03/11/2012  . Reflux esophagitis 03/11/2012     There are no preventive care reminders to display for this patient.   Review of Systems Vaginal itching. 10 point ros is otherwise  negative Physical Exam   BP 121/80 mmHg  Pulse 89  Temp(Src) 98.7 F (37.1 C) (Oral)  Wt 163 lb (73.936 kg) Physical Exam  Constitutional:  oriented to person, place, and time. appears well-developed and well-nourished. No distress.  HENT: [place]/AT, PERRLA, no scleral icterus Mouth/Throat: Oropharynx is clear and moist. No oropharyngeal exudate.  Cardiovascular: Normal rate, regular rhythm and normal heart  sounds. Exam reveals no gallop and no friction rub.  No murmur heard.  Pulmonary/Chest: Effort normal and breath sounds normal. No respiratory distress.  has no wheezes.  Neck = supple, no nuchal rigidity Abdominal: Soft. Bowel sounds are normal.  exhibits no distension. There is no tenderness.  Lymphadenopathy: no cervical adenopathy. No axillary adenopathy Neurological: alert and oriented to person, place, and time.  Skin: Skin is warm and dry. No rash noted. No erythema.  Psychiatric: a normal mood and affect.  behavior is normal.   Lab Results  Component Value Date   CD4TCELL 29* 05/23/2016   Lab Results  Component Value Date   CD4TABS 750 05/23/2016   CD4TABS 460 01/10/2016   CD4TABS 620 09/13/2015   Lab Results  Component Value Date   HIV1RNAQUANT <20 05/23/2016   Lab Results  Component Value Date   HEPBSAB POS* 09/13/2015   No results found for: RPR  CBC Lab Results  Component Value Date   WBC 6.1 05/09/2016   RBC 4.10 05/09/2016   HGB 11.3* 05/09/2016   HCT 35.5* 05/09/2016   PLT 363 05/09/2016   MCV 86.6 05/09/2016   MCH 27.6 05/09/2016   MCHC 31.8 05/09/2016   RDW 16.7* 05/09/2016   LYMPHSABS 2.5 05/09/2016   MONOABS 0.6 05/09/2016   EOSABS 0.1 05/09/2016   BASOSABS 0.0 05/09/2016   BMET Lab Results  Component Value Date   NA 140 05/09/2016   K 3.7 05/09/2016   CL 107 05/09/2016   CO2 25 05/09/2016   GLUCOSE 99 05/09/2016   BUN <5* 05/09/2016   CREATININE 0.81 05/09/2016   CALCIUM 9.4 05/09/2016   GFRNONAA >60 05/09/2016   GFRAA >60 05/09/2016     Assessment and Plan   hiv disease = well controlled. Continue with current regimen  Vertigo = will use meclezine to use if needed  Hip pain = Recovering from THP  Vaginal itching = suspect candidal vaginitis. Will give fluconazole. If not improved, will give course of metronidazole to treat for presumed BV  Syphilis exposure = will give 1 dose of pcn, recommend for her partner to be  tested  Ascus on pap + HPV = which is new. Will need to refer to women's clinic for next evaluation

## 2016-06-16 ENCOUNTER — Telehealth: Payer: Self-pay | Admitting: *Deleted

## 2016-06-16 NOTE — Telephone Encounter (Signed)
Pt needing a letter for her financial aid saying that she had surgery in Dec 2016 (end of fall)  and April 2017 (spring semester and that's why she missed 2 semesters. Please let pt know when ready.  Deseree Kennon Holter, CMA

## 2016-06-18 ENCOUNTER — Encounter: Payer: Self-pay | Admitting: Student in an Organized Health Care Education/Training Program

## 2016-06-23 ENCOUNTER — Encounter: Payer: Self-pay | Admitting: Internal Medicine

## 2016-07-01 ENCOUNTER — Encounter: Payer: Self-pay | Admitting: Internal Medicine

## 2016-07-01 ENCOUNTER — Encounter: Payer: Self-pay | Admitting: *Deleted

## 2016-07-01 ENCOUNTER — Other Ambulatory Visit: Payer: Self-pay | Admitting: Student in an Organized Health Care Education/Training Program

## 2016-07-01 ENCOUNTER — Telehealth: Payer: Self-pay | Admitting: Student in an Organized Health Care Education/Training Program

## 2016-07-01 NOTE — Telephone Encounter (Signed)
Spoke with patient on the phone regarding the letter she requested for school.  Had tried to send letter through [place] but was unsuccessful.  Patient will pick up letter at front of the office.  Everrett Coombe, MD

## 2016-07-16 ENCOUNTER — Telehealth: Payer: Self-pay | Admitting: *Deleted

## 2016-07-16 NOTE — Telephone Encounter (Signed)
Dental clearance form faxed to [facility] at [phone]. Confirmation received

## 2016-08-07 ENCOUNTER — Ambulatory Visit (INDEPENDENT_AMBULATORY_CARE_PROVIDER_SITE_OTHER): Payer: Medicaid Other | Admitting: Student

## 2016-08-07 ENCOUNTER — Encounter: Payer: Self-pay | Admitting: Student

## 2016-08-07 VITALS — BP 126/80 | HR 90 | Temp 98.7°F | Ht 65.0 in | Wt 169.4 lb

## 2016-08-07 DIAGNOSIS — N939 Abnormal uterine and vaginal bleeding, unspecified: Secondary | ICD-10-CM

## 2016-08-07 MED ORDER — NORGESTIMATE-ETH ESTRADIOL 0.25-35 MG-MCG PO TABS: 1.0000 | ORAL_TABLET | Freq: Every day | ORAL | 2 refills | Status: DC

## 2016-08-07 NOTE — Assessment & Plan Note (Signed)
Likely due to Depo injection. No sign and symptom of anemia. Gave prescription for Sprintec. Patient to consider another method for [facility].

## 2016-08-07 NOTE — Progress Notes (Signed)
   Subjective:    Patient ID: Kelsey Rodriguez is a 24 y.o. old female.  HPI #Vaginal bleeding: this has been going on for two months every day. She had depo shot two months ago. She has been on Depo for two years. She she states having similar symptoms early last year and it resolved with some pills she was given. I wasn't able to see what she was given after looking through her chart.  She describes the blood as dark looking with no clots or tissue. She reports using about 1- 2 packs a day, moderately soaked she reports mild low abdominal cramps intermittently. She also reports a weight gain since she was on Depo.   Denies dysuria, fever, chills, vaginal discharge, dizziness, shortness of breath or chest pain.   She is on medication for HIV  PMH: HIV on medication  SH: Denies smoking  Review of Systems Per HPI Objective:   Vitals:   08/07/16 1540  BP: 126/80  Pulse: 90  Temp: 98.7 F (37.1 C)  TempSrc: Oral  SpO2: 99%  Weight: 169 lb 6.4 oz (76.8 kg)  Height: 5\' 5"  (1.651 m)   GEN: appears well, no apparent distress. CVS: Regular rate and rhythm RESP: no increased work of breathing GI: No tenderness to palpation over suprapubic areas NEURO: alert and oriented appropriately, no gross defecits  PSYCH: appropriate mood and affect     Assessment & Plan:  Abnormal uterine bleeding (AUB) Likely due to Depo injection. No sign and symptom of anemia. Gave prescription for Sprintec. Patient to consider another method for [facility].

## 2016-08-07 NOTE — Patient Instructions (Signed)
It was great seeing you today! We have addressed the following issues today  1. Abnormal vaginal bleeding: I have sent a prescription for Sprintec to your pharmacy. Take this medication starting today. It may take about a month to two months months for your bleeding to improve with his medication..    If we did any lab work today, and the results require attention, either me or my nurse will get in touch with you. If everything is normal, you will get a letter in mail. If you don't hear from [place] in two weeks, please give [place] a call. Otherwise, I look forward to talking with you again at our next visit. If you have any questions or concerns before then, please call the clinic at [phone].  Please bring all your medications to every doctors visit   Sign up for My Chart to have easy access to your labs results, and communication with your Primary care physician.    Please check-out at the front desk before leaving the clinic.   Take Care,

## 2016-08-21 ENCOUNTER — Other Ambulatory Visit: Payer: Self-pay

## 2016-08-24 ENCOUNTER — Encounter (HOSPITAL_COMMUNITY): Payer: Self-pay

## 2016-08-24 ENCOUNTER — Emergency Department (HOSPITAL_COMMUNITY)
Admission: EM | Admit: 2016-08-24 | Discharge: 2016-08-24 | Disposition: A | Discharge status: Home / Self Care | Payer: Medicaid Other | Attending: Emergency Medicine | Admitting: Emergency Medicine

## 2016-08-24 DIAGNOSIS — R42 Dizziness and giddiness: Secondary | ICD-10-CM | POA: Diagnosis present

## 2016-08-24 DIAGNOSIS — Z96641 Presence of right artificial hip joint: Secondary | ICD-10-CM | POA: Insufficient documentation

## 2016-08-24 LAB — CBC WITH DIFFERENTIAL/PLATELET
Basophils Absolute: 0 10*3/uL (ref 0.0–0.1)
Basophils Relative: 0 %
Eosinophils Absolute: 0.1 10*3/uL (ref 0.0–0.7)
Eosinophils Relative: 1 %
HCT: 37.8 % (ref 36.0–46.0)
Hemoglobin: 12.2 g/dL (ref 12.0–15.0)
Lymphocytes Relative: 26 %
Lymphs Abs: 2 10*3/uL (ref 0.7–4.0)
MCH: 28.2 pg (ref 26.0–34.0)
MCHC: 32.3 g/dL (ref 30.0–36.0)
MCV: 87.5 fL (ref 78.0–100.0)
Monocytes Absolute: 0.7 10*3/uL (ref 0.1–1.0)
Monocytes Relative: 9 %
Neutro Abs: 5.1 10*3/uL (ref 1.7–7.7)
Neutrophils Relative %: 64 %
Platelets: 288 10*3/uL (ref 150–400)
RBC: 4.32 MIL/uL (ref 3.87–5.11)
RDW: 17.2 % — ABNORMAL HIGH (ref 11.5–15.5)
WBC: 7.9 10*3/uL (ref 4.0–10.5)

## 2016-08-24 LAB — BASIC METABOLIC PANEL
Anion gap: 9 (ref 5–15)
BUN: <5 mg/dL — ABNORMAL LOW (ref 6–20)
CO2: 23 mmol/L (ref 22–32)
Calcium: 9.3 mg/dL (ref 8.9–10.3)
Chloride: 106 mmol/L (ref 101–111)
Creatinine, Ser: 0.7 mg/dL (ref 0.44–1.00)
GFR calc Af Amer: >60 mL/min (ref >60)
GFR calc non Af Amer: >60 mL/min (ref >60)
Glucose, Bld: 90 mg/dL (ref 65–99)
Potassium: 3.7 mmol/L (ref 3.5–5.1)
Sodium: 138 mmol/L (ref 135–145)

## 2016-08-24 MED ORDER — MECLIZINE HCL 25 MG PO TABS
25.0000 mg | ORAL_TABLET | Freq: Once | ORAL | Status: AC
Start: 2016-08-24 — End: 2016-08-24
  Administered 2016-08-24: 25 mg via ORAL
  Filled 2016-08-24: qty 1

## 2016-08-24 MED ORDER — ONDANSETRON HCL 4 MG/2ML IJ SOLN
4.0000 mg | Freq: Once | INTRAMUSCULAR | Status: AC
  Administered 2016-08-24: 4 mg via INTRAVENOUS
  Filled 2016-08-24: qty 2

## 2016-08-24 MED ORDER — SODIUM CHLORIDE 0.9 % IV BOLUS (SEPSIS)
1000.0000 mL | Freq: Once | INTRAVENOUS | Status: AC
  Administered 2016-08-24: 1000 mL via INTRAVENOUS

## 2016-08-24 MED ORDER — ONDANSETRON 4 MG PO TBDP: 4.0000 mg | ORAL_TABLET | Freq: Three times a day (TID) | ORAL | 0 refills | Status: DC | PRN

## 2016-08-24 MED ORDER — LORAZEPAM 2 MG/ML IJ SOLN
1.0000 mg | Freq: Once | INTRAMUSCULAR | Status: DC
  Filled 2016-08-24: qty 1

## 2016-08-24 MED ORDER — DIAZEPAM 5 MG PO TABS
5.0000 mg | ORAL_TABLET | Freq: Once | ORAL | Status: AC
  Administered 2016-08-24: 5 mg via ORAL
  Filled 2016-08-24: qty 1

## 2016-08-24 NOTE — Discharge Instructions (Signed)
Continue taking your home prescription of meclizine as prescribed as needed for dizziness. Take your prescription of Zofran as prescribed as needed for nausea. Continue drinking fluids at home to remain hydrated. Continue taking your home medications as prescribed. Follow-up with Dr. Baxter Flattery at your scheduled appointment next week to have your labs redrawn. Follow up with your primary care provider within the next week for further management of your vertigo if your symptoms have not improved. Please return to the Emergency Department if symptoms worsen or new onset of fever, headache, visual changes, worsening/new dizziness, neck stiffness, chest pain, difficulty breathing, abdominal pain, vomiting, unable to keep fluids down, numbness, tingling, weakness, syncope, seizure.

## 2016-08-24 NOTE — ED Notes (Signed)
Held lorazepam due to pt being drowsy and falling asleep when talking and answering questions.  Pt states her nausea has gotten better.

## 2016-08-24 NOTE — ED Triage Notes (Signed)
Patient complains of vertigo since last pm, hx of same with vomiting. Taken her medication with no relief. Alert and oriented

## 2016-08-24 NOTE — ED Provider Notes (Signed)
[place] DEPT Provider Note   CSN: [id number] Arrival date & time: 08/24/16  [id number]     History   Chief Complaint Chief Complaint  Patient presents with  . Dizziness    HPI Kelsey Rodriguez is a 25 y.o. female.  Patient is a 25 year old female with past medical history of HIV, TB, vertigo, adrenal insufficiency and AVN s/p bilateral hip replacement who presents the ED with complaint of dizziness, onset yesterday around 6 PM. Patient reports while she was at home resting she began to to feel dizzy. She reports feeling like the room is spinning and notes it has been constant since yesterday. Patient states pain is worse when changing position or moving her head. Endorses associated nausea and vomiting. She reports taking meclizine last night without relief. She notes her dizziness feels similar to episodes of vertigo she has had in the past. Denies fever, chills, visual changes, headache, neck stiffness, cough, rhinorrhea, shortness of breath, chest pain, palpitations, abdominal pain, diarrhea, urinary symptoms, numbness, tingling, weakness, rash. Patient states she has been taking all of her home/HIV medications as prescribed. She notes she has a follow-up appointment scheduled with ID, Dr. Baxter Flattery, next week to have labs done.  PCP- Dr. Burr Medico ID- Dr. Baxter Flattery      Past Medical History:  Diagnosis Date  . 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 Adrenal insufficiency ([place])   . Anemia of chronic disease 03/11/2012  . Bell's palsy 08/26/2013  . 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 Chronic back pain   . Chronic leg pain    bilateral knees, ankles  . Fatigue   . 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 HIV (human immunodeficiency virus infection) ([place]) 02/2012  . Laceration of ankle, right 11/18/2012  . Lumbar radiculopathy   . Pelvic pain   . Reflux esophagitis 03/11/2012  . Tuberculosis   . 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 Vertigo     Patient Active Problem List   Diagnosis  Date Noted  . Abnormal uterine bleeding (AUB) 08/07/2016  . 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 Avascular necrosis of bone of right hip ([place]) 04/04/2016  . 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 Avascular necrosis of bone of left hip ([place]) 12/14/2015  . 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 Nausea with vomiting 05/18/2015  . Overweight (BMI 25.0-29.9) 04/20/2015  . Seasonal allergies 03/20/2015  . Menorrhagia 03/19/2015  . Back pain of lumbar region with sciatica 02/12/2015  . Dizziness and giddiness 01/23/2015  . Primary adrenal insufficiency ([place]) 01/03/2015  . Fatigue 09/25/2014  . Chest pain 07/07/2013  . Laceration of ankle, right 11/18/2012  . 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 Vaginal discharge 05/30/2012  . Arthralgia 05/20/2012  . HIV (human immunodeficiency virus infection) ([place]) 03/16/2012  . Dysphagia 03/11/2012  . 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 Iron deficiency anemia 03/11/2012  . Reflux esophagitis 03/11/2012    Past Surgical History:  Procedure Laterality Date  . APPENDECTOMY  ~ 2000  . DILATION AND CURETTAGE OF UTERUS  2008  . ESOPHAGOGASTRODUODENOSCOPY  03/11/2012   Procedure: ESOPHAGOGASTRODUODENOSCOPY (EGD);  Surgeon: Lafayette Dragon, MD;  Location: [facility] ENDOSCOPY;  Service: Endoscopy;  Laterality: N/A;  . ESOPHAGOGASTRODUODENOSCOPY N/A 03/07/2014   Procedure: ESOPHAGOGASTRODUODENOSCOPY (EGD);  Surgeon: Gatha Mayer, MD;  Location: [facility] ENDOSCOPY;  Service: Endoscopy;  Laterality: N/A;  . LUNG BIOPSY  02/2012  . TOTAL HIP ARTHROPLASTY Left 12/14/2015   Procedure:  LEFT TOTAL HIP ARTHROPLASTY ANTERIOR APPROACH;  Surgeon: Mcarthur Rossetti, MD;  Location: [facility] ORS;  Service: Orthopedics;  Laterality: Left;  . TOTAL HIP ARTHROPLASTY Right 04/04/2016   Procedure: RIGHT TOTAL HIP ARTHROPLASTY ANTERIOR APPROACH;  Surgeon: Mcarthur Rossetti, MD;  Location: [facility] ORS;   Service: Orthopedics;  Laterality: Right;    OB History    Gravida Para Term Preterm AB Living   1 0 0 0 1 0   SAB TAB Ectopic Multiple Live Births   1 0 0 0         Home Medications    Prior to Admission medications   Medication Sig Start Date End Date Taking? Authorizing Provider  acetaminophen (TYLENOL) 325 MG tablet Take 2 tablets (650 mg total) by mouth every 6 (six) hours as needed. Patient not taking: Reported on 05/28/2016 05/09/16   Olivia Canter Sam, PA-C  acetaminophen (TYLENOL) 500 MG tablet Take 500-1,000 mg by mouth every 6 (six) hours as needed for mild pain or headache.    Historical Provider, MD  Darunavir Ethanolate (PREZISTA) 800 MG tablet Take 1 tablet (800 mg total) by mouth daily with breakfast. 02/19/16   Carlyle Basques, MD  elvitegravir-cobicistat-emtricitabine-tenofovir (GENVOYA) 150-150-200-10 MG TABS tablet Take 1 tablet by mouth daily with breakfast. 02/19/16   Carlyle Basques, MD  fluconazole (DIFLUCAN) 150 MG tablet Take 1 tablet (150 mg total) by mouth daily. Use 1 dose for vaginal itching 05/28/16   Carlyle Basques, MD  hydrocortisone (CORTEF) 5 MG tablet Take 2 tablets in the morning and 1 tablet in the afternoon, no later than 6 PM 06/28/15   Philemon Kingdom, MD  lidocaine (LIDODERM) 5 % Place 1 patch onto the skin daily. Remove & Discard patch within 12 hours or as directed by MD 05/16/16   Patrecia Pour, MD  meclizine (ANTIVERT) 32 MG tablet Take 1 tablet (32 mg total) by mouth 3 (three) times daily as needed. If needed for "room spinning" dizziness 05/28/16   Carlyle Basques, MD  medroxyPROGESTERone (DEPO-PROVERA) 150 MG/ML injection Inject 150 mg into the muscle every 3 (three) months.    Historical Provider, MD  norgestimate-ethinyl estradiol ([place] 28) 0.25-35 MG-MCG tablet Take 1 tablet by mouth daily. 08/07/16   Mercy Riding, MD  ondansetron (ZOFRAN ODT) 4 MG disintegrating tablet Take 1 tablet (4 mg total) by mouth every 8 (eight) hours as needed for nausea or  vomiting. 08/24/16   Nona Dell, PA-C  oxyCODONE-acetaminophen (PERCOCET/ROXICET) 5-325 MG tablet Take 1-2 tablets by mouth 2 (two) times daily as needed for moderate pain or severe pain. 04/05/16   Mcarthur Rossetti, MD    Family History Family History  Problem Relation Age of Onset  . Heart disease Father     Vague not clearly cardiac    Social History Social History  Substance Use Topics  . Smoking status: Never Smoker  . Smokeless tobacco: Never Used  . Alcohol use No     Comment: socially     Allergies   Hydrocodone and Tramadol   Review of Systems Review of Systems  Gastrointestinal: Positive for nausea and vomiting.  Neurological: Positive for dizziness.  All other systems reviewed and are negative.    Physical Exam Updated Vital Signs BP 126/88   Pulse 64   Temp 98.2 F (36.8 C)   Resp 12   SpO2 100%   Physical Exam  Constitutional: She is oriented to person, place, and time. She appears well-developed and well-nourished. No distress.  HENT:  Head: Normocephalic and atraumatic.  Right Ear: Tympanic membrane normal.  Left Ear: Tympanic membrane normal.  Mouth/Throat: Uvula is midline, oropharynx is clear and moist and mucous membranes are normal. No oropharyngeal exudate, posterior oropharyngeal edema, posterior oropharyngeal erythema or tonsillar abscesses. No tonsillar exudate.  Eyes: Conjunctivae and EOM are normal. Pupils are equal, round, and reactive to light. Right eye exhibits no discharge. Left eye exhibits no discharge. No scleral icterus.  Horizontal nystagmus bilaterally.  Neck: Normal range of motion. Neck supple.  Cardiovascular: Normal rate, regular rhythm, normal heart sounds and intact distal pulses.   Pulmonary/Chest: Effort normal and breath sounds normal. No respiratory distress. She has no wheezes. She has no rales. She exhibits no tenderness.  Abdominal: Soft. Bowel sounds are normal. She exhibits no distension and no  mass. There is no tenderness. There is no rebound and no guarding. No hernia.  Musculoskeletal: Normal range of motion. She exhibits no edema.  Neurological: She is alert and oriented to person, place, and time. She has normal strength. No cranial nerve deficit or sensory deficit. Coordination normal.  Normal finger-nose-finger.   Skin: Skin is warm and dry. She is not diaphoretic.  Nursing note and vitals reviewed.    ED Treatments / Results  Labs (all labs ordered are listed, but only abnormal results are displayed) Labs Reviewed  CBC WITH DIFFERENTIAL/PLATELET - Abnormal; Notable for the following:       Result Value   RDW 17.2 (*)    All other components within normal limits  BASIC METABOLIC PANEL - Abnormal; Notable for the following:    BUN <5 (*)    All other components within normal limits    EKG  EKG Interpretation None       Radiology No results found.  Procedures Procedures (including critical care time)  Medications Ordered in ED Medications  LORazepam (ATIVAN) injection 1 mg (0 mg Intravenous Hold 08/24/16 1123)  sodium chloride 0.9 % bolus 1,000 mL (0 mLs Intravenous Stopped 08/24/16 1156)  ondansetron (ZOFRAN) injection 4 mg (4 mg Intravenous Given 08/24/16 1019)  diazepam (VALIUM) tablet 5 mg (5 mg Oral Given 08/24/16 1019)  meclizine (ANTIVERT) tablet 25 mg (25 mg Oral Given 08/24/16 1019)     Initial Impression / Assessment and Plan / ED Course  I have reviewed the triage vital signs and the nursing notes.  Pertinent labs & imaging results that were available during my care of the patient were reviewed by me and considered in my medical decision making (see chart for details).  Clinical Course    Patient presents with dizziness with associated nausea and vomiting. No relief with meclizine at home. History of vertigo, reports this episode is consistent with her typical vertigo. PMH of HIV. VSS. Exam revealed patient with horizontal nystagmus  bilaterally. No neuro deficits. Remaining exam unremarkable. Patient given IV fluids, Zofran, meclizine and Valium. Labs unremarkable. On reevaluation patient reports her dizziness and nausea have significantly improved. Patient able to stand and ambulate without assistance, no ataxia noted. Patient able to tolerate PO. Patient symptoms appear to be consistent with peripheral vertigo. I do not suspect central etiology/stroke at this time and do not feel that any further workup or imaging is warranted at this time. Discussed results and plan for discharge with patient. Plan to discharge patient home with symptomatic treatment and advised to have her continue taking her meclizine as prescribed as needed. Advised patient to follow up with ID at her scheduled appointment next week. Discussed  return precautions with patient.  Final Clinical Impressions(s) / ED Diagnoses   Final diagnoses:  Vertigo    New Prescriptions Discharge Medication List as of 08/24/2016  1:36 PM       Nona Dell, PA-C 08/24/16 1542    Blanchie Dessert, MD 08/24/16 2053

## 2016-08-24 NOTE — ED Notes (Signed)
Patient ambulated to restroom with this RN standing by, pt appeared steady on her feet. Pt reported some remaining dizziness but stated that it had improved.

## 2016-08-30 IMAGING — MR MR LUMBAR SPINE WO/W CM
4 of 7 series · 19 of 48 positions shown · IV contrast (multihance)
Comparison: CT Abdomen and Pelvis 10/20/2014.

CLINICAL DATA: 24-year-old female with lumbar back pain and
sciatica. Lumbosacral pain in HIV positive patient with history of
treated tuberculosis. Initial encounter.

EXAM:
MRI LUMBAR SPINE WITHOUT AND WITH CONTRAST
TECHNIQUE: Multiplanar and multiecho pulse sequences of the lumbar spine were
obtained without and with intravenous contrast.
CONTRAST:  15mL MULTIHANCE GADOBENATE DIMEGLUMINE 529 MG/ML IV SOLN

[Series 400: T2 · sagittal · 4.0mm · 0.55mm/px · 3 of 14 slices shown (1 of 2)]
[im 1/14]
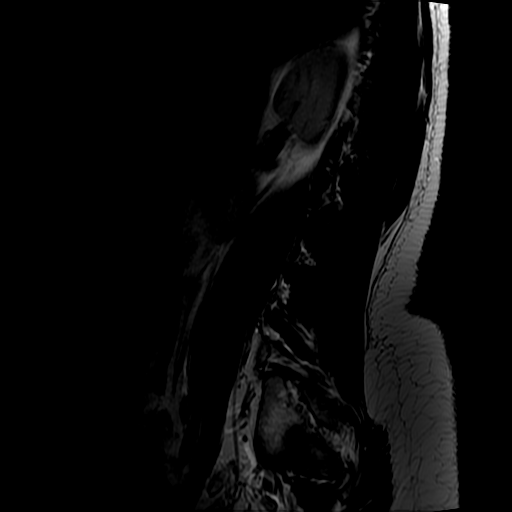
[im 7/14]
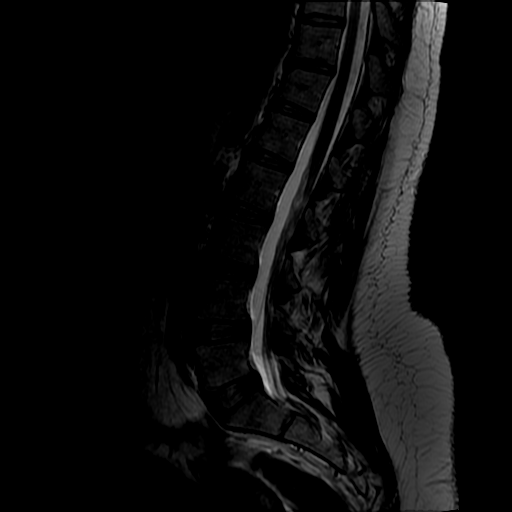
[im 14/14]
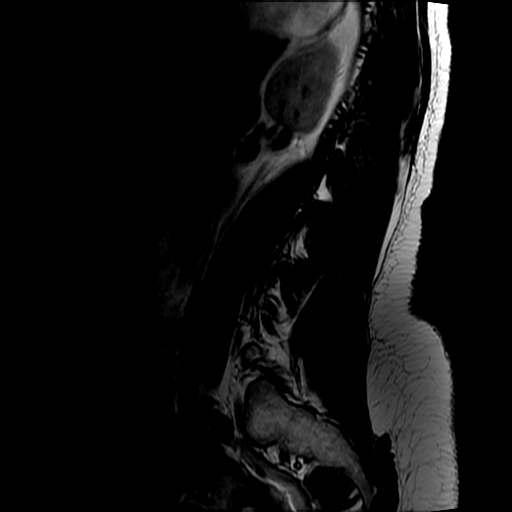

[Series 600: T1 · sagittal · 4.0mm · 0.55mm/px · 3 of 14 slices shown (1 of 2)]
[im 1/14]
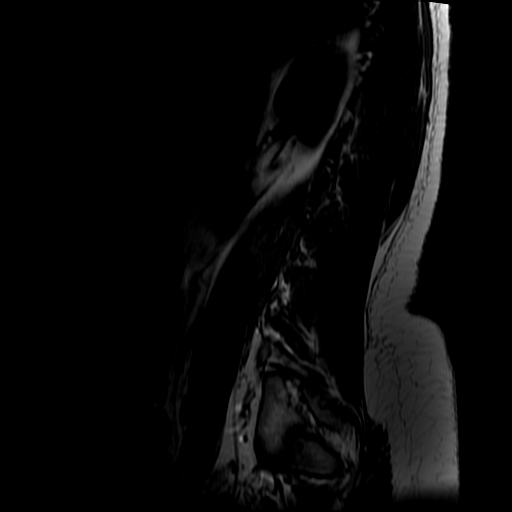
[im 9/14]
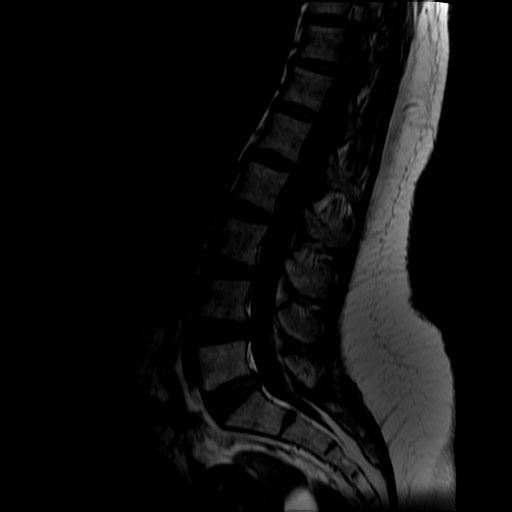
[im 14/14]
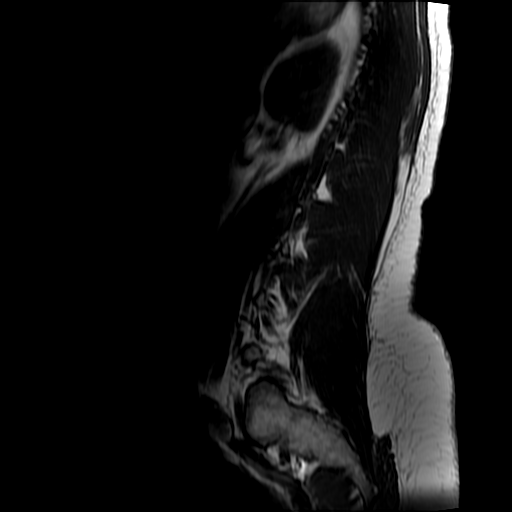

[Series 700: T2 · axial · 4.0mm · 0.39mm/px · z∈[-20,+185]mm · 10 of 43 slices shown (2 of 2)]
[im 1/43]
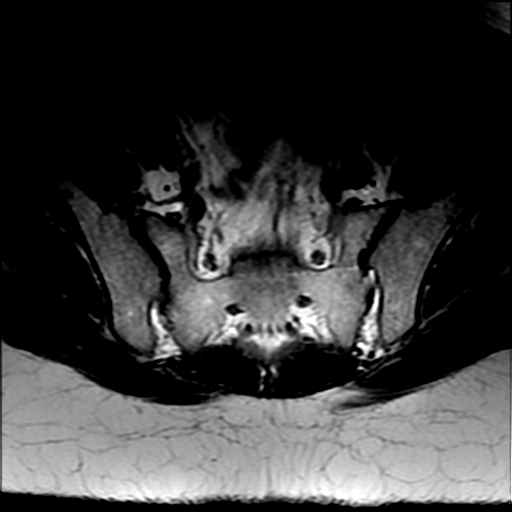
[im 5/43]
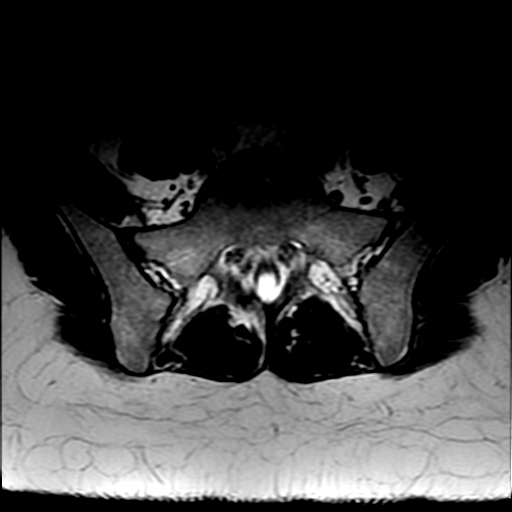
[im 9/43]
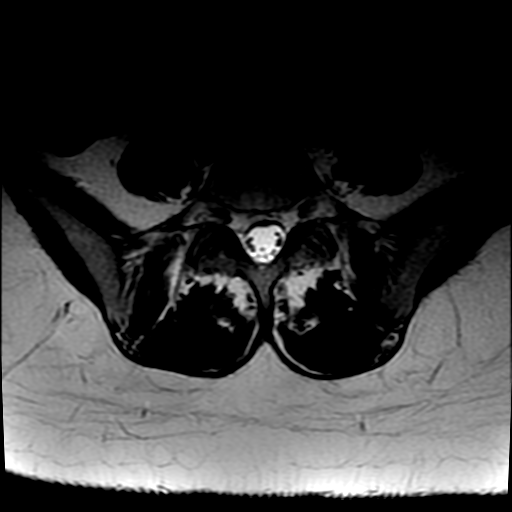
[im 13/43]
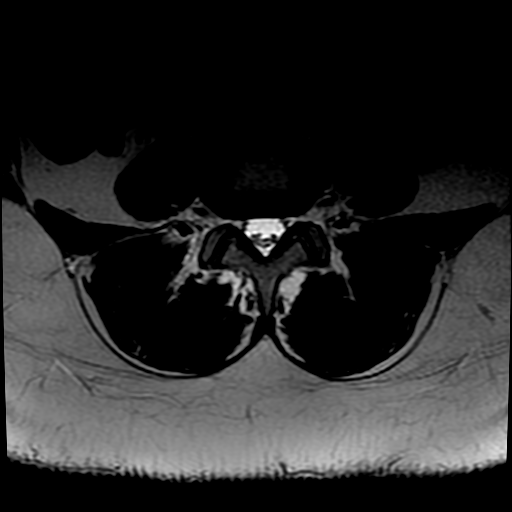
[im 17/43]
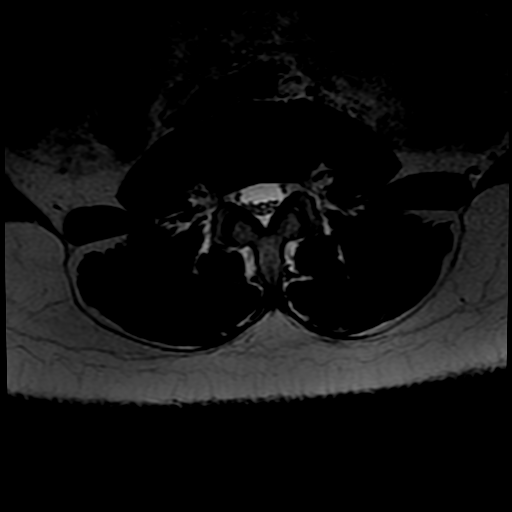
[im 22/43]
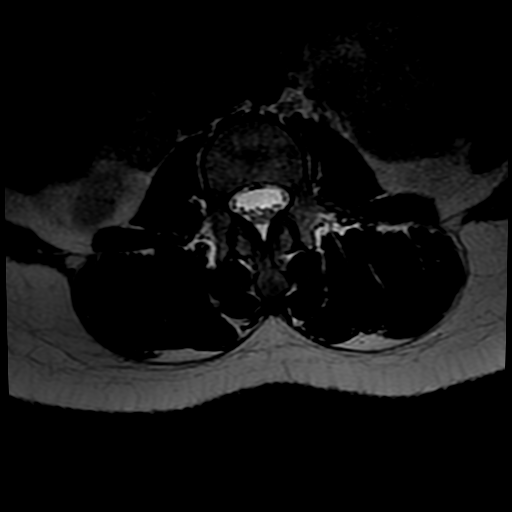
[im 26/43]
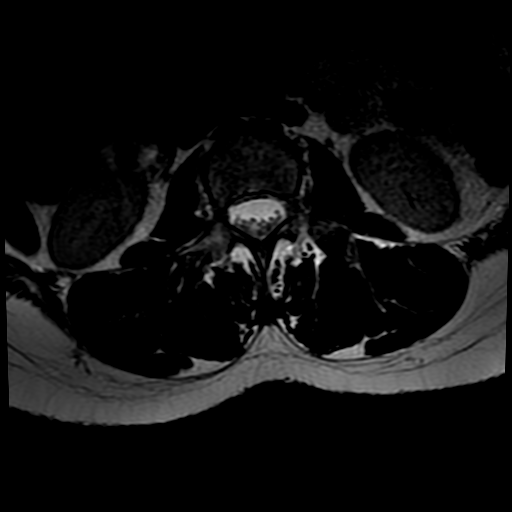
[im 30/43]
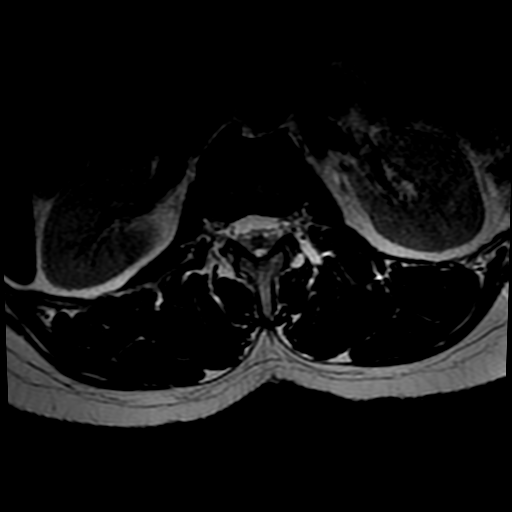
[im 34/43]
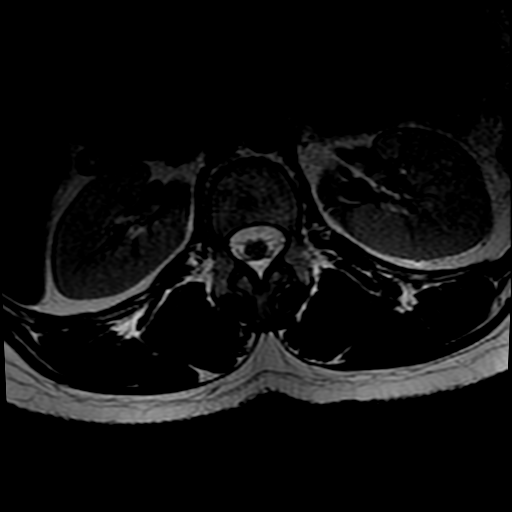
[im 38/43]
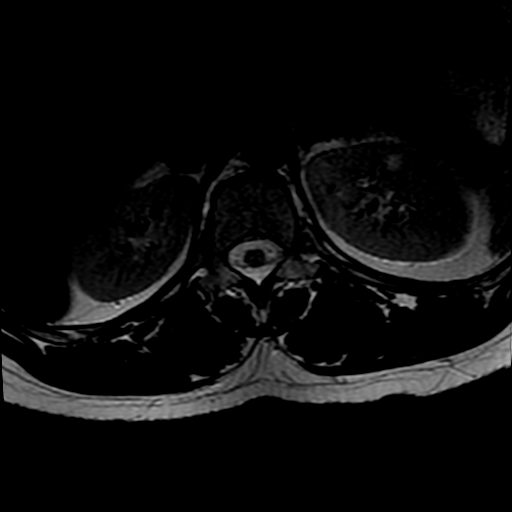

[Series 800: T1 · axial · 4.0mm · 0.39mm/px · z∈[-0,+185]mm · 3 of 43 slices shown (2 of 2)]
[im 5/43]
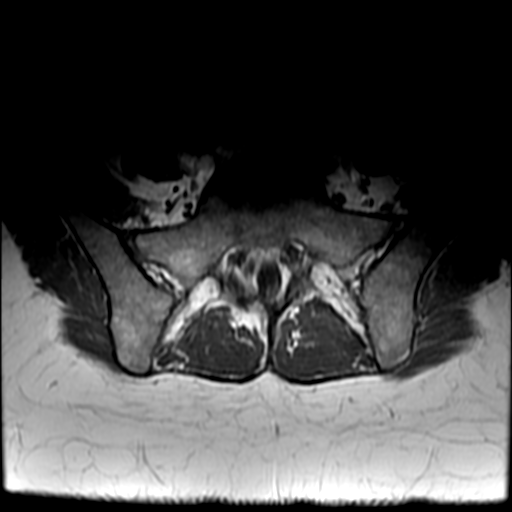
[im 22/43]
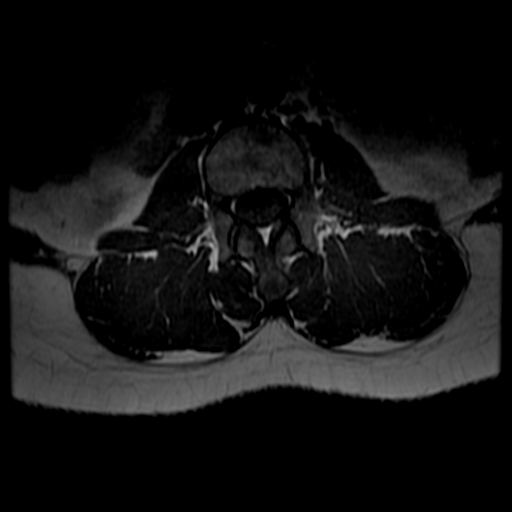
[im 38/43]
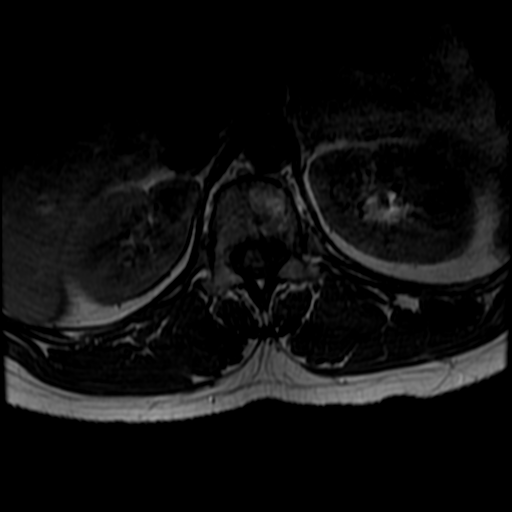

[19 of 48 positions shown; findings below may reference images not displayed]

FINDINGS: Normal lumbar segmentation depicted on the comparison. Stable and
normal vertebral height and alignment. No marrow edema or evidence
of acute osseous abnormality. Benign vertebral body hemangioma
incidentally noted in the T11 body. Negative visible sacrum.

Visualized lower thoracic spinal cord is normal with conus medularis
at L1-L2. No abnormal intradural enhancement. Cauda equina nerve
roots within normal limits.

Negative visualized abdominal viscera. No retroperitoneal
lymphadenopathy. In the right hemipelvis there is a tubular
structure which might be associated with the left adnexa measuring
up to 18 mm diameter. This has both intrinsic T2 and T1 signal
(series 600, image 11). No definite pelvic free fluid.

Negative visualized posterior paraspinal soft tissues.

T10-T11:  Negative.

T11-T12:  Mild disc bulge.  No stenosis.

T12-L1:  Negative.

L1-L2:  Negative.

L2-L3:  Negative.

L3-L4:  Negative.

L4-L5:  Negative.

L5-S1: Mild disc bulge and facet hypertrophy. Borderline to mild
right L5 foraminal stenosis.
IMPRESSION: 1. Essentially negative lumbar spine and visible sacrum. Mild right
L5 foraminal stenosis.
2. Partially visible tubular structure in the left hemipelvis
suspicious for hydrosalpinx with proteinaceous fluid/debris.

## 2016-09-04 ENCOUNTER — Ambulatory Visit (INDEPENDENT_AMBULATORY_CARE_PROVIDER_SITE_OTHER): Payer: Medicaid Other | Admitting: Internal Medicine

## 2016-09-04 ENCOUNTER — Encounter: Payer: Self-pay | Admitting: Internal Medicine

## 2016-09-04 VITALS — BP 136/89 | HR 84 | Temp 98.6°F | Wt 164.0 lb

## 2016-09-04 DIAGNOSIS — B20 Human immunodeficiency virus [HIV] disease: Secondary | ICD-10-CM

## 2016-09-04 DIAGNOSIS — Z23 Encounter for immunization: Secondary | ICD-10-CM | POA: Diagnosis not present

## 2016-09-04 MED ORDER — ESTRADIOL 2 MG PO TABS: 2.0000 mg | ORAL_TABLET | Freq: Every day | ORAL | 0 refills | Status: DC

## 2016-09-04 NOTE — Patient Instructions (Signed)
We have sent in prescription for estradiol. Take 1 tab per day x 7 days. This will take the place of the other medication, sprintec. (stop sprintec while you take estradiol) can resume taking sprintec once 7 day of medication is finished  For hip pain, try lidocaine cream

## 2016-09-04 NOTE — Progress Notes (Signed)
RFV: follow up for hiv  Patient ID: Kelsey Rodriguez, female   DOB: July 22, 1991, 25 y.o.   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 Last lab showed syphilis RPR 1:1 for the first time which question whether is a false negative. Will repeat today. She is dong well overall  ROS: Still has occ hip pain despite recent tha  Has vaginal spotting despite starting sprintec in addn to depo Outpatient Encounter Prescriptions as of 09/04/2016  Medication Sig  . acetaminophen (TYLENOL) 325 MG tablet Take 2 tablets (650 mg total) by mouth every 6 (six) hours as needed. (Patient not taking: Reported on 05/28/2016)  . acetaminophen (TYLENOL) 500 MG tablet Take 500-1,000 mg by mouth every 6 (six) hours as needed for mild pain or headache.  . Darunavir Ethanolate (PREZISTA) 800 MG tablet Take 1 tablet (800 mg total) by mouth daily with breakfast.  . elvitegravir-cobicistat-emtricitabine-tenofovir (GENVOYA) 150-150-200-10 MG TABS tablet Take 1 tablet by mouth daily with breakfast.  . fluconazole (DIFLUCAN) 150 MG tablet Take 1 tablet (150 mg total) by mouth daily. Use 1 dose for vaginal itching  . hydrocortisone (CORTEF) 5 MG tablet Take 2 tablets in the morning and 1 tablet in the afternoon, no later than 6 PM  . lidocaine (LIDODERM) 5 % Place 1 patch onto the skin daily. Remove & Discard patch within 12 hours or as directed by MD  . meclizine (ANTIVERT) 32 MG tablet Take 1 tablet (32 mg total) by mouth 3 (three) times daily as needed. If needed for "room spinning" dizziness  . medroxyPROGESTERone (DEPO-PROVERA) 150 MG/ML injection Inject 150 mg into the muscle every 3 (three) months.  . norgestimate-ethinyl estradiol (SPRINTEC 28) 0.25-35 MG-MCG tablet Take 1 tablet by mouth daily.  . ondansetron (ZOFRAN ODT) 4 MG disintegrating tablet Take 1 tablet (4 mg total) by mouth every 8 (eight) hours as needed for nausea or vomiting.  [name] oxyCODONE-acetaminophen  (PERCOCET/ROXICET) 5-325 MG tablet Take 1-2 tablets by mouth 2 (two) times daily as needed for moderate pain or severe pain.   No facility-administered encounter medications on file as of 09/04/2016.      Patient Active Problem List   Diagnosis Date Noted  . Abnormal uterine bleeding (AUB) 08/07/2016  . 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 Avascular necrosis of bone of right hip ([place]) 04/04/2016  . 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 Avascular necrosis of bone of left hip ([place]) 12/14/2015  . 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 Osteonecrosis of both hips ([place]) 06/28/2015  . Nausea with vomiting 05/18/2015  . Overweight (BMI 25.0-29.9) 04/20/2015  . Seasonal allergies 03/20/2015  . Menorrhagia 03/19/2015  . Back pain of lumbar region with sciatica 02/12/2015  . Dizziness and giddiness 01/23/2015  . Primary adrenal insufficiency ([place]) 01/03/2015  . Fatigue 09/25/2014  . Chest pain 07/07/2013  . Laceration of ankle, right 11/18/2012  . 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 Vaginal discharge 05/30/2012  . Arthralgia 05/20/2012  . HIV (human immunodeficiency virus infection) ([place]) 03/16/2012  . Dysphagia 03/11/2012  . 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 Iron deficiency anemia 03/11/2012  . Reflux esophagitis 03/11/2012     Health Maintenance Due  Topic Date Due  . INFLUENZA VACCINE  07/15/2016     Physical Exam   BP 136/89   Pulse 84   Temp 98.6 F (37 C) (Oral)   Wt 74.4 kg (164 lb)  BMI 27.29 kg/m  Physical Exam  Constitutional:  oriented to person, place, and time. appears well-developed and well-nourished. No distress.  HENT: [place]/AT, PERRLA, no scleral icterus Mouth/Throat: Oropharynx is clear and moist. No oropharyngeal exudate.  Cardiovascular: Normal rate, regular rhythm and normal heart sounds. Exam reveals no gallop and no friction rub.  No murmur heard.  Pulmonary/Chest: Effort normal and breath sounds normal. No  respiratory distress.  has no wheezes.  Neck = supple, no nuchal rigidity Abdominal: Soft. Bowel sounds are normal.  exhibits no distension. There is no tenderness.  Lymphadenopathy: no cervical adenopathy. No axillary adenopathy Neurological: alert and oriented to person, place, and time.  Skin: Skin is warm and dry. No rash noted. No erythema.  Psychiatric: a normal mood and affect.  behavior is normal.   Lab Results  Component Value Date   CD4TCELL 29 (L) 05/23/2016   Lab Results  Component Value Date   CD4TABS 750 05/23/2016   CD4TABS 460 01/10/2016   CD4TABS 620 09/13/2015   Lab Results  Component Value Date   HIV1RNAQUANT <20 05/23/2016   Lab Results  Component Value Date   HEPBSAB POS (A) 09/13/2015   No results found for: RPR  CBC Lab Results  Component Value Date   WBC 7.9 08/24/2016   RBC 4.32 08/24/2016   HGB 12.2 08/24/2016   HCT 37.8 08/24/2016   PLT 288 08/24/2016   MCV 87.5 08/24/2016   MCH 28.2 08/24/2016   MCHC 32.3 08/24/2016   RDW 17.2 (H) 08/24/2016   LYMPHSABS 2.0 08/24/2016   MONOABS 0.7 08/24/2016   EOSABS 0.1 08/24/2016   BASOSABS 0.0 08/24/2016   BMET Lab Results  Component Value Date   NA 138 08/24/2016   K 3.7 08/24/2016   CL 106 08/24/2016   CO2 23 08/24/2016   GLUCOSE 90 08/24/2016   BUN <5 (L) 08/24/2016   CREATININE 0.70 08/24/2016   CALCIUM 9.3 08/24/2016   GFRNONAA >60 08/24/2016   GFRAA >60 08/24/2016     Assessment and Plan  Vaginal bleeding = will do 7 d trial of estradiol. Have her stop sprintec then resume after estradiol  hiv disease =continue taking meds, will check lab  Syphilis = recheck rpr. At last visit she had+ rpr with 1:1 but no new sexual partners and they use condoms. I suspect that it maybe false positive. if still positive, will have her come in for im injection  Right hip pain = continue with stretching exercise and try lidocaine cream to see if any improvement at night

## 2016-09-05 LAB — T-HELPER CELL (CD4) - (RCID CLINIC ONLY)
CD4 % Helper T Cell: 24 % — ABNORMAL LOW (ref 33–55)
CD4 T Cell Abs: 650 /uL (ref 400–2700)

## 2016-09-05 LAB — RPR

## 2016-09-08 LAB — HIV-1 RNA QUANT-NO REFLEX-BLD
HIV 1 RNA Quant: 25 copies/mL — ABNORMAL HIGH (ref <20)
HIV-1 RNA Quant, Log: 1.4 Log copies/mL — ABNORMAL HIGH (ref <1.30)

## 2016-09-12 ENCOUNTER — Encounter: Payer: Self-pay | Admitting: Internal Medicine

## 2016-09-17 ENCOUNTER — Ambulatory Visit (INDEPENDENT_AMBULATORY_CARE_PROVIDER_SITE_OTHER): Payer: Medicaid Other | Admitting: Orthopaedic Surgery

## 2016-09-17 DIAGNOSIS — M545 Low back pain: Secondary | ICD-10-CM | POA: Diagnosis not present

## 2016-09-22 ENCOUNTER — Other Ambulatory Visit (INDEPENDENT_AMBULATORY_CARE_PROVIDER_SITE_OTHER): Payer: Self-pay | Admitting: Physician Assistant

## 2016-09-22 DIAGNOSIS — M545 Low back pain: Secondary | ICD-10-CM

## 2016-09-23 ENCOUNTER — Emergency Department (HOSPITAL_COMMUNITY): Payer: Medicaid Other

## 2016-09-23 ENCOUNTER — Inpatient Hospital Stay (HOSPITAL_COMMUNITY)
Admission: EM | Admit: 2016-09-23 | Discharge: 2016-09-25 | DRG: 097 | Disposition: A | Discharge status: Home / Self Care | Payer: Medicaid Other | Attending: Family Medicine | Admitting: Family Medicine

## 2016-09-23 ENCOUNTER — Encounter (HOSPITAL_COMMUNITY): Payer: Self-pay

## 2016-09-23 DIAGNOSIS — E2740 Unspecified adrenocortical insufficiency: Secondary | ICD-10-CM | POA: Diagnosis present

## 2016-09-23 DIAGNOSIS — R839 Unspecified abnormal finding in cerebrospinal fluid: Secondary | ICD-10-CM | POA: Diagnosis not present

## 2016-09-23 DIAGNOSIS — B20 Human immunodeficiency virus [HIV] disease: Secondary | ICD-10-CM | POA: Diagnosis present

## 2016-09-23 DIAGNOSIS — Z79899 Other long term (current) drug therapy: Secondary | ICD-10-CM | POA: Diagnosis not present

## 2016-09-23 DIAGNOSIS — G030 Nonpyogenic meningitis: Secondary | ICD-10-CM | POA: Diagnosis present

## 2016-09-23 DIAGNOSIS — Z96643 Presence of artificial hip joint, bilateral: Secondary | ICD-10-CM | POA: Diagnosis present

## 2016-09-23 DIAGNOSIS — G039 Meningitis, unspecified: Secondary | ICD-10-CM | POA: Diagnosis present

## 2016-09-23 DIAGNOSIS — Z21 Asymptomatic human immunodeficiency virus [HIV] infection status: Secondary | ICD-10-CM | POA: Diagnosis present

## 2016-09-23 DIAGNOSIS — E271 Primary adrenocortical insufficiency: Secondary | ICD-10-CM | POA: Diagnosis present

## 2016-09-23 DIAGNOSIS — Z885 Allergy status to narcotic agent status: Secondary | ICD-10-CM | POA: Diagnosis not present

## 2016-09-23 DIAGNOSIS — R51 Headache: Secondary | ICD-10-CM | POA: Diagnosis present

## 2016-09-23 LAB — COMPREHENSIVE METABOLIC PANEL
ALT: 19 U/L (ref 14–54)
AST: 24 U/L (ref 15–41)
Albumin: 3.8 g/dL (ref 3.5–5.0)
Alkaline Phosphatase: 89 U/L (ref 38–126)
Anion gap: 4 — ABNORMAL LOW (ref 5–15)
BUN: 6 mg/dL (ref 6–20)
CO2: 24 mmol/L (ref 22–32)
Calcium: 9.1 mg/dL (ref 8.9–10.3)
Chloride: 110 mmol/L (ref 101–111)
Creatinine, Ser: 0.78 mg/dL (ref 0.44–1.00)
GFR calc Af Amer: >60 mL/min (ref >60)
GFR calc non Af Amer: >60 mL/min (ref >60)
Glucose, Bld: 87 mg/dL (ref 65–99)
Potassium: 3.9 mmol/L (ref 3.5–5.1)
Sodium: 138 mmol/L (ref 135–145)
Total Bilirubin: 0.3 mg/dL (ref 0.3–1.2)
Total Protein: 7.5 g/dL (ref 6.5–8.1)

## 2016-09-23 LAB — CBC WITH DIFFERENTIAL/PLATELET
Basophils Absolute: 0 10*3/uL (ref 0.0–0.1)
Basophils Relative: 1 %
Eosinophils Absolute: 0.1 10*3/uL (ref 0.0–0.7)
Eosinophils Relative: 3 %
HCT: 36.5 % (ref 36.0–46.0)
Hemoglobin: 11.8 g/dL — ABNORMAL LOW (ref 12.0–15.0)
Lymphocytes Relative: 40 %
Lymphs Abs: 1.8 10*3/uL (ref 0.7–4.0)
MCH: 28.4 pg (ref 26.0–34.0)
MCHC: 32.3 g/dL (ref 30.0–36.0)
MCV: 88 fL (ref 78.0–100.0)
Monocytes Absolute: 0.7 10*3/uL (ref 0.1–1.0)
Monocytes Relative: 15 %
Neutro Abs: 1.8 10*3/uL (ref 1.7–7.7)
Neutrophils Relative %: 41 %
Platelets: 195 10*3/uL (ref 150–400)
RBC: 4.15 MIL/uL (ref 3.87–5.11)
RDW: 16.5 % — ABNORMAL HIGH (ref 11.5–15.5)
WBC: 4.4 10*3/uL (ref 4.0–10.5)

## 2016-09-23 LAB — URINALYSIS, ROUTINE W REFLEX MICROSCOPIC
Bilirubin Urine: NEGATIVE
Glucose, UA: NEGATIVE mg/dL
Ketones, ur: NEGATIVE mg/dL
Leukocytes, UA: NEGATIVE
Nitrite: NEGATIVE
Protein, ur: NEGATIVE mg/dL
Specific Gravity, Urine: 1.011 (ref 1.005–1.030)
pH: 7.5 (ref 5.0–8.0)

## 2016-09-23 LAB — CSF CELL COUNT WITH DIFFERENTIAL
Eosinophils, CSF: 0 % (ref 0–1)
Lymphs, CSF: 92 % — ABNORMAL HIGH (ref 40–80)
Monocyte-Macrophage-Spinal Fluid: 7 % — ABNORMAL LOW (ref 15–45)
RBC Count, CSF: 8 /mm3 — ABNORMAL HIGH
Segmented Neutrophils-CSF: 1 % (ref 0–6)
Tube #: 3
WBC, CSF: 66 /mm3 (ref 0–5)

## 2016-09-23 LAB — URINE MICROSCOPIC-ADD ON
Bacteria, UA: NONE SEEN
WBC, UA: NONE SEEN WBC/hpf (ref 0–5)

## 2016-09-23 LAB — I-STAT BETA HCG BLOOD, ED (MC, WL, AP ONLY): I-stat hCG, quantitative: <5 m[IU]/mL (ref <5)

## 2016-09-23 LAB — CRYPTOCOCCAL ANTIGEN, CSF: Crypto Ag: NEGATIVE

## 2016-09-23 LAB — GLUCOSE, CSF: Glucose, CSF: 42 mg/dL (ref 40–70)

## 2016-09-23 LAB — PROTEIN, CSF: Total  Protein, CSF: 31 mg/dL (ref 15–45)

## 2016-09-23 MED ORDER — ELVITEG-COBIC-EMTRICIT-TENOFAF 150-150-200-10 MG PO TABS
1.0000 | ORAL_TABLET | Freq: Every day | ORAL | Status: DC
  Administered 2016-09-23 – 2016-09-25 (×3): 1 via ORAL
  Filled 2016-09-23 (×3): qty 1

## 2016-09-23 MED ORDER — MORPHINE SULFATE (PF) 4 MG/ML IV SOLN
4.0000 mg | Freq: Once | INTRAVENOUS | Status: AC
Start: 2016-09-23 — End: 2016-09-23
  Administered 2016-09-23: 4 mg via INTRAVENOUS
  Filled 2016-09-23: qty 1

## 2016-09-23 MED ORDER — DEXTROSE 5 % IV SOLN
10.0000 mg/kg | Freq: Three times a day (TID) | INTRAVENOUS | Status: DC
  Administered 2016-09-23 – 2016-09-24 (×5): 745 mg via INTRAVENOUS
  Filled 2016-09-23 (×8): qty 14.9

## 2016-09-23 MED ORDER — ONDANSETRON HCL 4 MG/2ML IJ SOLN
4.0000 mg | Freq: Four times a day (QID) | INTRAMUSCULAR | Status: DC | PRN
Start: 2016-09-23 — End: 2016-09-25

## 2016-09-23 MED ORDER — ONDANSETRON HCL 4 MG PO TABS: 4.0000 mg | ORAL_TABLET | Freq: Four times a day (QID) | ORAL | Status: DC | PRN

## 2016-09-23 MED ORDER — LIDOCAINE HCL (PF) 1 % IJ SOLN
5.0000 mL | Freq: Once | INTRAMUSCULAR | Status: AC
  Administered 2016-09-23: 5 mL via INTRADERMAL

## 2016-09-23 MED ORDER — POLYETHYLENE GLYCOL 3350 17 G PO PACK: 17.0000 g | PACK | Freq: Every day | ORAL | Status: DC | PRN

## 2016-09-23 MED ORDER — SODIUM CHLORIDE 0.9 % IV BOLUS (SEPSIS)
1000.0000 mL | Freq: Once | INTRAVENOUS | Status: AC
  Administered 2016-09-23: 1000 mL via INTRAVENOUS

## 2016-09-23 MED ORDER — DARUNAVIR ETHANOLATE 800 MG PO TABS
800.0000 mg | ORAL_TABLET | Freq: Every day | ORAL | Status: DC
  Administered 2016-09-23 – 2016-09-25 (×3): 800 mg via ORAL
  Filled 2016-09-23 (×3): qty 1

## 2016-09-23 MED ORDER — ACETAMINOPHEN 650 MG RE SUPP: 650.0000 mg | Freq: Four times a day (QID) | RECTAL | Status: DC | PRN

## 2016-09-23 MED ORDER — ONDANSETRON HCL 4 MG/2ML IJ SOLN
4.0000 mg | Freq: Once | INTRAMUSCULAR | Status: AC
  Administered 2016-09-23: 4 mg via INTRAVENOUS
  Filled 2016-09-23: qty 2

## 2016-09-23 MED ORDER — KETOROLAC TROMETHAMINE 10 MG PO TABS
20.0000 mg | ORAL_TABLET | Freq: Once | ORAL | Status: AC
  Administered 2016-09-23: 20 mg via ORAL
  Filled 2016-09-23: qty 2

## 2016-09-23 MED ORDER — ACETAMINOPHEN 325 MG PO TABS
650.0000 mg | ORAL_TABLET | Freq: Four times a day (QID) | ORAL | Status: DC | PRN
  Administered 2016-09-23 – 2016-09-24 (×2): 650 mg via ORAL
  Filled 2016-09-23 (×3): qty 2

## 2016-09-23 MED ORDER — ENOXAPARIN SODIUM 40 MG/0.4ML ~~LOC~~ SOLN
40.0000 mg | SUBCUTANEOUS | Status: DC
  Administered 2016-09-23 – 2016-09-24 (×2): 40 mg via SUBCUTANEOUS
  Filled 2016-09-23 (×2): qty 0.4

## 2016-09-23 NOTE — H&P (Signed)
[facility] Admission History and Physical Service Pager: [phone]  Patient name: Kelsey Rodriguez Medical record number: [id number] Date of birth: [birth year] Age: 25 y.o. Gender: female  Primary Care Provider: Everrett Coombe, MD Consultants: ID Code Status: FULL  Chief Complaint: headache  Assessment and Plan: Kelsey Rodriguez is a 25 y.o. female presenting with headache, neck pain and back pain x past week . PMH is significiant for TB , HIV, Avascular necrosis, abnormal uterine bleeding and episodes of meningitis in the past.    Aseptic Meningitis.  Presented to ED with headache, neck pain and back pain with fevers and chills at home.  States they were low grade fevers (100-101).  Afebrile here.  On admission, clinically well-appearing with VSS.  LP with wbc 66 in CSF, elevated lymphocytes, Protein, glucose normal.  Cryptococcus Ag negative.  Gram stain of fluid revealed no organisms.  Serum WBC wnl.  CT head negative for acute processes.  Negative Kernig sign and negative Brudinski sign on physical exam. No focal findings on exam.  Oriented and neurologically intact.  Antibiotics not started in ED. Treating as HSV meningitis, per ID recs.  -Admit to med-surg, attending Dr. Ree Kida -HSV by PCR -Follows with Dr. Baxter Flattery (Infectious disease), appreciate recs.  Discussed patient's care with her.  She agrees with checking for HSV and initiaing Acyclovir.  Does not recommend empiric coverage for bacterial meningitis, as CSF does not suggest bacterial infection.  We will plan to call her once HSV results and tailor plan accordingly at that time.  No formal consult needed at this time, given good CD4 count. -Start Acyclovir 745 mg Q8 (10mg /kg) -Zofran 4 mg Q6 prn -Tylenol 650 mg prn -CSF cx pending -RSV pcr pending -Blood cx pending -Droplet precautions -monitor for fevers -vitals per unit routine  HIV. Last CD4 count 650 in September with viral load 25. Currently stable on  medications. States she is compliant.   -continue home meds : Genvoya (elvitegravir 150 -cobicistat 150 -emtricitabine 200 -tenofovir 10) -consider ID consult if patient clinically worsens  TB. With history of TB, treated 2013.  CXR in ED showing no active cardiopulmonary disease.  Stable.   FEN/GI: Heart healthy, SLIV Prophylaxis: Lovenox  Disposition: Admit to med-surg floor for treatment  History of Present Illness:  Kelsey Rodriguez is a 25 y.o. female presenting with headache x past week.  Patient reports that she had fevers at home and they were low grade (100-101).  She notes that headaches have been constant and are unrelieved by Tylenol/ Aleve.  She describes the headaches as starting from the crown of her head and radiating down her neck.  She also endorses back pain and notes that her neck hurts if she moves much.  She reports stiffness.  She has previously been admitted for meningitis; however she notes that at that time she was also experiencing numbness. Numbness is not present on this admission and she notes that the symptoms this time seem less severe.  Patient also complains of intermittent lightheadedness especially with standing.  Reports she had a head cold 2-3 days prior to beginning these symptoms with runny nose and some congestion.  No recent international travel. She states she is up-to-date on her immunizations.  Denies blurry vision, nausea, vomiting, diarrhea, cough, shortness of breath and diplopia.  Denies focal weakness.  She has no known sick contacts.  Takes medications at home as prescribed.  Follows with Dr. Baxter Flattery for ID. Lives at home with family.  Normally completely  functional in ADLs.  Review Of Systems: Per HPI with the following additions: All other systems reviewed and negative.  ROS  Patient Active Problem List   Diagnosis Date Noted  . CSF abnormal 09/23/2016  . Abnormal uterine bleeding (AUB) 08/07/2016  . 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  Avascular necrosis of bone of right hip ([place]) 04/04/2016  . 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 Avascular necrosis of bone of left hip ([place]) 12/14/2015  . 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 Osteonecrosis of both hips ([place]) 06/28/2015  . Nausea with vomiting 05/18/2015  . Overweight (BMI 25.0-29.9) 04/20/2015  . Seasonal allergies 03/20/2015  . Menorrhagia 03/19/2015  . Back pain of lumbar region with sciatica 02/12/2015  . Dizziness and giddiness 01/23/2015  . Primary adrenal insufficiency ([place]) 01/03/2015  . Fatigue 09/25/2014  . Chest pain 07/07/2013  . Laceration of ankle, right 11/18/2012  . 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 Vaginal discharge 05/30/2012  . Arthralgia 05/20/2012  . HIV (human immunodeficiency virus infection) ([place]) 03/16/2012  . Dysphagia 03/11/2012  . 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 Iron deficiency anemia 03/11/2012  . Reflux esophagitis 03/11/2012   Past Medical History: Past Medical History:  Diagnosis Date  . 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 Adrenal insufficiency ([place])   . Anemia of chronic disease 03/11/2012  . Bell's palsy 08/26/2013  . 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 Chronic back pain   . Chronic leg pain    bilateral knees, ankles  . Fatigue   . 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 HIV (human immunodeficiency virus infection) ([place]) 02/2012  . Laceration of ankle, right 11/18/2012  . Lumbar radiculopathy   . Pelvic pain   . Reflux esophagitis 03/11/2012  . Tuberculosis   . 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 Vertigo    Past Surgical History: Past Surgical History:  Procedure Laterality Date  . APPENDECTOMY  ~ 2000  . DILATION AND CURETTAGE OF UTERUS  2008  . ESOPHAGOGASTRODUODENOSCOPY  03/11/2012   Procedure: ESOPHAGOGASTRODUODENOSCOPY (EGD);  Surgeon: Lafayette Dragon, MD;  Location: [facility] ENDOSCOPY;  Service: Endoscopy;  Laterality: N/A;  . ESOPHAGOGASTRODUODENOSCOPY N/A 03/07/2014    Procedure: ESOPHAGOGASTRODUODENOSCOPY (EGD);  Surgeon: Gatha Mayer, MD;  Location: [facility] ENDOSCOPY;  Service: Endoscopy;  Laterality: N/A;  . LUNG BIOPSY  02/2012  . TOTAL HIP ARTHROPLASTY Left 12/14/2015   Procedure: LEFT TOTAL HIP ARTHROPLASTY ANTERIOR APPROACH;  Surgeon: Mcarthur Rossetti, MD;  Location: [facility] ORS;  Service: Orthopedics;  Laterality: Left;  . TOTAL HIP ARTHROPLASTY Right 04/04/2016   Procedure: RIGHT TOTAL HIP ARTHROPLASTY ANTERIOR APPROACH;  Surgeon: Mcarthur Rossetti, MD;  Location: [facility] ORS;  Service: Orthopedics;  Laterality: Right;   Social History: Social History  Substance Use Topics  . Smoking status: Never Smoker  . Smokeless tobacco: Never Used  . Alcohol use No     Comment: socially   Family History: Family History  Problem Relation Age of Onset  . Heart disease Father     Vague not clearly cardiac   Allergies and Medications: Allergies  Allergen Reactions  . Hydrocodone Itching and Nausea Only    Tolerates Oxycodone  . Tramadol Itching and Nausea Only    Tolerates oxycodone   No current facility-administered medications on file prior to encounter.    Current Outpatient Prescriptions on File Prior to Encounter  Medication Sig Dispense Refill  . Darunavir Ethanolate (PREZISTA) 800 MG tablet Take 1 tablet (800 mg total)  by mouth daily with breakfast. 30 tablet 11  . elvitegravir-cobicistat-emtricitabine-tenofovir (GENVOYA) 150-150-200-10 MG TABS tablet Take 1 tablet by mouth daily with breakfast. 30 tablet 11  . acetaminophen (TYLENOL) 325 MG tablet Take 2 tablets (650 mg total) by mouth every 6 (six) hours as needed. (Patient not taking: Reported on 09/23/2016) 30 tablet 0  . estradiol (ESTRACE) 2 MG tablet Take 1 tablet (2 mg total) by mouth daily. X 7 days only for vaginal bleeding (Patient not taking: Reported on 09/23/2016) 14 tablet 0  . fluconazole (DIFLUCAN) 150 MG tablet Take 1 tablet (150 mg total) by mouth daily. Use 1 dose for  vaginal itching (Patient not taking: Reported on 09/23/2016) 3 tablet 0  . hydrocortisone (CORTEF) 5 MG tablet Take 2 tablets in the morning and 1 tablet in the afternoon, no later than 6 PM (Patient not taking: Reported on 09/23/2016) 300 tablet 3  . lidocaine (LIDODERM) 5 % Place 1 patch onto the skin daily. Remove & Discard patch within 12 hours or as directed by MD (Patient not taking: Reported on 09/23/2016) 30 patch 0  . meclizine (ANTIVERT) 32 MG tablet Take 1 tablet (32 mg total) by mouth 3 (three) times daily as needed. If needed for "room spinning" dizziness (Patient not taking: Reported on 09/23/2016) 30 tablet 0  . norgestimate-ethinyl estradiol (SPRINTEC 28) 0.25-35 MG-MCG tablet Take 1 tablet by mouth daily. (Patient not taking: Reported on 09/23/2016) 1 Package 2  . ondansetron (ZOFRAN ODT) 4 MG disintegrating tablet Take 1 tablet (4 mg total) by mouth every 8 (eight) hours as needed for nausea or vomiting. (Patient not taking: Reported on 09/23/2016) 10 tablet 0  . oxyCODONE-acetaminophen (PERCOCET/ROXICET) 5-325 MG tablet Take 1-2 tablets by mouth 2 (two) times daily as needed for moderate pain or severe pain. (Patient not taking: Reported on 09/23/2016) 60 tablet 0   Objective: BP 122/81 (BP Location: Right Arm)   Pulse 93   Temp 98.9 F (37.2 C) (Oral)   Resp 16   Ht 5\' 5"  (1.651 m)   Wt 164 lb (74.4 kg)   LMP 09/20/2016 (Exact Date)   SpO2 100%   BMI 27.29 kg/m  Exam: General: AA female laying in hospital bed in no acute distress Eyes: EOMI, PERRL, sclera white ENTM: MMM, dentition good Neck: supple, no rigidity, is able to flex c-spine but notes some increased tightness/ discomfort with flexion, no midline TTP Cardiovascular: RRR, no MRG Respiratory: CTA B/L, normal work of breathing on room air Gastrointestinal: soft, NT ND, no masses, +bs MSK: FROMx4, WWP, no edema Derm: no rashes or bruises noted,  No suspicious lesions Neuro: AAOx3, no focal deficits,  neurologically grossly intact, negative Brudinksi, negative Kernig sign, no weakness in extremities, strength and sensation intact 5/5 Psych: pleasant, mood appropriate  Labs and Imaging: Results for orders placed or performed during the hospital encounter of 09/23/16 (from the past 24 hour(s))  Culture, blood (Routine X 2) w Reflex to ID Panel     Status: None (Preliminary result)   Collection Time: 09/23/16  8:25 AM  Result Value Ref Range   Specimen Description BLOOD LEFT ANTECUBITAL    Special Requests BOTTLES DRAWN AEROBIC AND ANAEROBIC  5CC    Culture NO GROWTH < 12 HOURS    Report Status PENDING   CBC with Differential/Platelet     Status: Abnormal   Collection Time: 09/23/16  8:25 AM  Result Value Ref Range   WBC 4.4 4.0 - 10.5 K/uL   RBC 4.15 3.87 -  5.11 MIL/uL   Hemoglobin 11.8 (L) 12.0 - 15.0 g/dL   HCT 36.5 36.0 - 46.0 %   MCV 88.0 78.0 - 100.0 fL   MCH 28.4 26.0 - 34.0 pg   MCHC 32.3 30.0 - 36.0 g/dL   RDW 16.5 (H) 11.5 - 15.5 %   Platelets 195 150 - 400 K/uL   Neutrophils Relative % 41 %   Neutro Abs 1.8 1.7 - 7.7 K/uL   Lymphocytes Relative 40 %   Lymphs Abs 1.8 0.7 - 4.0 K/uL   Monocytes Relative 15 %   Monocytes Absolute 0.7 0.1 - 1.0 K/uL   Eosinophils Relative 3 %   Eosinophils Absolute 0.1 0.0 - 0.7 K/uL   Basophils Relative 1 %   Basophils Absolute 0.0 0.0 - 0.1 K/uL  Comprehensive metabolic panel     Status: Abnormal   Collection Time: 09/23/16  8:25 AM  Result Value Ref Range   Sodium 138 135 - 145 mmol/L   Potassium 3.9 3.5 - 5.1 mmol/L   Chloride 110 101 - 111 mmol/L   CO2 24 22 - 32 mmol/L   Glucose, Bld 87 65 - 99 mg/dL   BUN 6 6 - 20 mg/dL   Creatinine, Ser 0.78 0.44 - 1.00 mg/dL   Calcium 9.1 8.9 - 10.3 mg/dL   Total Protein 7.5 6.5 - 8.1 g/dL   Albumin 3.8 3.5 - 5.0 g/dL   AST 24 15 - 41 U/L   ALT 19 14 - 54 U/L   Alkaline Phosphatase 89 38 - 126 U/L   Total Bilirubin 0.3 0.3 - 1.2 mg/dL   GFR calc non Af Amer >60 >60 mL/min   GFR calc  Af Amer >60 >60 mL/min   Anion gap 4 (L) 5 - 15  I-Stat Beta hCG blood, ED ([facility], [facility], [facility] only)     Status: None   Collection Time: 09/23/16  8:39 AM  Result Value Ref Range   I-stat hCG, quantitative <5.0 <5 mIU/mL   Comment 3          Culture, blood (Routine X 2) w Reflex to ID Panel     Status: None (Preliminary result)   Collection Time: 09/23/16  8:44 AM  Result Value Ref Range   Specimen Description BLOOD RIGHT ANTECUBITAL    Special Requests BOTTLES DRAWN AEROBIC AND ANAEROBIC  5CC    Culture NO GROWTH < 12 HOURS    Report Status PENDING   Urinalysis, Routine w reflex microscopic (not at [facility])     Status: Abnormal   Collection Time: 09/23/16  9:54 AM  Result Value Ref Range   Color, Urine YELLOW YELLOW   APPearance HAZY (A) CLEAR   Specific Gravity, Urine 1.011 1.005 - 1.030   pH 7.5 5.0 - 8.0   Glucose, UA NEGATIVE NEGATIVE mg/dL   Hgb urine dipstick LARGE (A) NEGATIVE   Bilirubin Urine NEGATIVE NEGATIVE   Ketones, ur NEGATIVE NEGATIVE mg/dL   Protein, ur NEGATIVE NEGATIVE mg/dL   Nitrite NEGATIVE NEGATIVE   Leukocytes, UA NEGATIVE NEGATIVE  Urine microscopic-add on     Status: Abnormal   Collection Time: 09/23/16  9:54 AM  Result Value Ref Range   Squamous Epithelial / LPF 0-5 (A) NONE SEEN   WBC, UA NONE SEEN 0 - 5 WBC/hpf   RBC / HPF 0-5 0 - 5 RBC/hpf   Bacteria, UA NONE SEEN NONE SEEN  Glucose, CSF     Status: None   Collection Time: 09/23/16  1:20  PM  Result Value Ref Range   Glucose, CSF 42 40 - 70 mg/dL  Protein, CSF     Status: None   Collection Time: 09/23/16  1:20 PM  Result Value Ref Range   Total  Protein, CSF 31 15 - 45 mg/dL  CSF cell count with differential     Status: Abnormal   Collection Time: 09/23/16  1:20 PM  Result Value Ref Range   Tube # 3    Color, CSF COLORLESS COLORLESS   Appearance, CSF CLEAR CLEAR   Supernatant NOT INDICATED    RBC Count, CSF 8 (H) 0 /cu mm   WBC, CSF 66 (HH) 0 - 5 /cu mm   Segmented Neutrophils-CSF 1 0 - 6 %    Lymphs, CSF 92 (H) 40 - 80 %   Monocyte-Macrophage-Spinal Fluid 7 (L) 15 - 45 %   Eosinophils, CSF 0 0 - 1 %  CSF culture     Status: None (Preliminary result)   Collection Time: 09/23/16  1:20 PM  Result Value Ref Range   Specimen Description CSF    Special Requests TUBE 2    Gram Stain      WBC PRESENT, PREDOMINANTLY MONONUCLEAR NO ORGANISMS SEEN CYTOSPIN SMEAR    Culture PENDING    Report Status PENDING   Cryptococcal antigen, CSF     Status: None   Collection Time: 09/23/16  1:20 PM  Result Value Ref Range   Crypto Ag NEGATIVE NEGATIVE   Cryptococcal Ag Titer NOT INDICATED NOT INDICATED    Dg Chest 2 View  Result Date: 09/23/2016 CLINICAL DATA:  Fever for 5 day EXAM: CHEST  2 VIEW COMPARISON:  05/16/2016 FINDINGS: Normal heart size. Lungs clear. No pneumothorax. No pleural effusion. IMPRESSION: No active cardiopulmonary disease. Electronically Signed   By: Marybelle Killings M.D.   On: 09/23/2016 09:32   Ct Head Wo Contrast  Result Date: 09/23/2016 CLINICAL DATA:  Frontal headache radiating posterior for 5 days. EXAM: CT HEAD WITHOUT CONTRAST TECHNIQUE: Contiguous axial images were obtained from the base of the skull through the vertex without intravenous contrast. COMPARISON:  03/28/2014 FINDINGS: Brain: No acute intracranial abnormality. Specifically, no hemorrhage, hydrocephalus, mass lesion, acute infarction, or significant intracranial injury. Vascular: No hyperdense vessel or unexpected calcification. Skull: No acute calvarial abnormality. Sinuses/Orbits: Visualized paranasal sinuses and mastoids clear. Orbital soft tissues unremarkable. Other: None IMPRESSION: Negative. Electronically Signed   By: Rolm Baptise M.D.   On: 09/23/2016 10:08   Dg Lumbar Puncture Fluoro Guide  Result Date: 09/23/2016 CLINICAL DATA:  Severe headache and fever. EXAM: DIAGNOSTIC LUMBAR PUNCTURE UNDER FLUOROSCOPIC GUIDANCE FLUOROSCOPY TIME:  Fluoroscopy Time:  0 minutes and 36 seconds Radiation  Exposure Index (if provided by the fluoroscopic device): 2.40 mGy Number of Acquired Spot Images: 0 PROCEDURE: Informed consent was obtained from the patient prior to the procedure, including potential complications of headache, allergy, and pain. With the patient prone, the lower back was prepped with Betadine. 1% Lidocaine was used for local anesthesia. Lumbar puncture was performed at the L3-4 level using a 20 gauge needle with return of clear CSF with an opening pressure of 18 cm water. 9 ml of CSF were obtained for laboratory studies. The patient tolerated the procedure well and there were no apparent complications. IMPRESSION: Fluoroscopic guided lumbar puncture with 9 cc of clear CSF obtained for appropriate laboratory evaluation. Opening pressure was 18 cm of water. Electronically Signed   By: Marijo Sanes M.D.   On: 09/23/2016 13:34  Lovenia Kim, MD 09/23/2016, 3:30 PM PGY-1, [place] [facility] pager: [phone], text pages welcome  I have separately seen and examined the patient. I have discussed the findings and exam with Dr Reesa Chew and agree with the above note.  My changes/additions are outlined in BLUE.   Axzel Rockhill M. Lajuana Ripple, DO PGY-3, [facility]

## 2016-09-23 NOTE — ED Notes (Signed)
Py returned from xray

## 2016-09-23 NOTE — ED Notes (Signed)
Pt returned from CT, placed back on monitor.

## 2016-09-23 NOTE — ED Notes (Signed)
Patient transported to X-ray 

## 2016-09-23 NOTE — ED Notes (Signed)
Critical high WBC CSF of 66, reported to MD.

## 2016-09-23 NOTE — ED Notes (Signed)
Radiology coming to get pt for lumbar puncture.

## 2016-09-23 NOTE — ED Notes (Signed)
Dr. Lita Mains attempted beside lumbar puncture, but attempt was unsuccessful. New orders received. Pt lying in supine position. VSS.

## 2016-09-23 NOTE — ED Provider Notes (Signed)
[facility] DEPT Provider Note   CSN: [id number] Arrival date & time: 09/23/16  [id number]     History   Chief Complaint Chief Complaint  Patient presents with  . Back Pain  . Neck Pain  . Headache  . Fever  . Chills    HPI Kelsey Rodriguez is a 25 y.o. female.  HPI Patient with history of HIV, tuberculosis, meningitis presents with constant headache for one week. Associated with subjective fevers and chills. Also has posterior neck and low back pain. Patient also complains of intermittent lightheadedness especially with standing. Denies room spinning sensation. Denies any focal weakness or numbness. No visual changes. Denies cough or shortness of breath. No nausea, vomiting or diarrhea. Denies urinary symptoms. Patient is followed by Dr. Graylon Good of infectious disease. Last CD4 count was 750. Patient has had no sick contact exposure. No recent international travel. States she is up-to-date on her immunizations. Past Medical History:  Diagnosis Date  . 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 Adrenal insufficiency ([place])   . Anemia of chronic disease 03/11/2012  . Bell's palsy 08/26/2013  . 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 Chronic back pain   . Chronic leg pain    bilateral knees, ankles  . Fatigue   . 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 HIV (human immunodeficiency virus infection) ([place]) 02/2012  . Laceration of ankle, right 11/18/2012  . Lumbar radiculopathy   . Pelvic pain   . Reflux esophagitis 03/11/2012  . Tuberculosis   . 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 Vertigo     Patient Active Problem List   Diagnosis Date Noted  . CSF abnormal 09/23/2016  . Abnormal uterine bleeding (AUB) 08/07/2016  . 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 Avascular necrosis of bone of right hip ([place]) 04/04/2016  . 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 Avascular necrosis of bone of left hip ([place]) 12/14/2015  . 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 Nausea with vomiting 05/18/2015  . Overweight (BMI 25.0-29.9) 04/20/2015  . Seasonal allergies 03/20/2015  . Menorrhagia 03/19/2015  . Back pain of lumbar region with sciatica 02/12/2015  . Dizziness and giddiness 01/23/2015  . Primary adrenal insufficiency ([place]) 01/03/2015  . Fatigue 09/25/2014  . Chest pain 07/07/2013  . Laceration of ankle, right 11/18/2012  . 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 Vaginal discharge 05/30/2012  . Arthralgia 05/20/2012  . HIV (human immunodeficiency virus infection) ([place]) 03/16/2012  . Dysphagia 03/11/2012  . 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 Iron deficiency anemia 03/11/2012  . Reflux esophagitis 03/11/2012    Past Surgical History:  Procedure Laterality Date  . APPENDECTOMY  ~ 2000  . DILATION AND CURETTAGE OF UTERUS  2008  . ESOPHAGOGASTRODUODENOSCOPY  03/11/2012   Procedure: ESOPHAGOGASTRODUODENOSCOPY (EGD);  Surgeon: Lafayette Dragon, MD;  Location[facility] ENDOSCOPY;  Service: Endoscopy;  Laterality: N/A;  . ESOPHAGOGASTRODUODENOSCOPY N/A 03/07/2014   Procedure: ESOPHAGOGASTRODUODENOSCOPY (EGD);  Surgeon: Gatha Mayer, MD;  Location: [facility] ENDOSCOPY;  Service: Endoscopy;  Laterality: N/A;  . LUNG BIOPSY  02/2012  . TOTAL HIP ARTHROPLASTY Left 12/14/2015   Procedure: LEFT TOTAL HIP ARTHROPLASTY ANTERIOR APPROACH;  Surgeon: Mcarthur Rossetti, MD;  Location: [facility] ORS;  Service: Orthopedics;  Laterality: Left;  . TOTAL HIP ARTHROPLASTY Right 04/04/2016   Procedure: RIGHT TOTAL HIP ARTHROPLASTY ANTERIOR APPROACH;  Surgeon: Mcarthur Rossetti, MD;  Location: [facility] ORS;  Service: Orthopedics;  Laterality: Right;  OB History    Gravida Para Term Preterm AB Living   1 0 0 0 1 0   SAB TAB Ectopic Multiple Live Births   1 0 0 0         Home Medications    Prior to Admission medications   Medication Sig Start Date End Date Taking? Authorizing Provider  Darunavir Ethanolate (PREZISTA) 800 MG tablet  Take 1 tablet (800 mg total) by mouth daily with breakfast. 02/19/16  Yes Carlyle Basques, MD  elvitegravir-cobicistat-emtricitabine-tenofovir (GENVOYA) 150-150-200-10 MG TABS tablet Take 1 tablet by mouth daily with breakfast. 02/19/16  Yes Carlyle Basques, MD  acetaminophen (TYLENOL) 325 MG tablet Take 2 tablets (650 mg total) by mouth every 6 (six) hours as needed. Patient not taking: Reported on 09/23/2016 05/09/16   Olivia Canter Sam, PA-C  estradiol (ESTRACE) 2 MG tablet Take 1 tablet (2 mg total) by mouth daily. X 7 days only for vaginal bleeding Patient not taking: Reported on 09/23/2016 09/04/16 09/04/17  Carlyle Basques, MD  fluconazole (DIFLUCAN) 150 MG tablet Take 1 tablet (150 mg total) by mouth daily. Use 1 dose for vaginal itching Patient not taking: Reported on 09/23/2016 05/28/16   Carlyle Basques, MD  hydrocortisone (CORTEF) 5 MG tablet Take 2 tablets in the morning and 1 tablet in the afternoon, no later than 6 PM Patient not taking: Reported on 09/23/2016 06/28/15   Philemon Kingdom, MD  lidocaine (LIDODERM) 5 % Place 1 patch onto the skin daily. Remove & Discard patch within 12 hours or as directed by MD Patient not taking: Reported on 09/23/2016 05/16/16   Patrecia Pour, MD  meclizine (ANTIVERT) 32 MG tablet Take 1 tablet (32 mg total) by mouth 3 (three) times daily as needed. If needed for "room spinning" dizziness Patient not taking: Reported on 09/23/2016 05/28/16   Carlyle Basques, MD  norgestimate-ethinyl estradiol ([place] 28) 0.25-35 MG-MCG tablet Take 1 tablet by mouth daily. Patient not taking: Reported on 09/23/2016 08/07/16   Mercy Riding, MD  ondansetron (ZOFRAN ODT) 4 MG disintegrating tablet Take 1 tablet (4 mg total) by mouth every 8 (eight) hours as needed for nausea or vomiting. Patient not taking: Reported on 09/23/2016 08/24/16   Nona Dell, PA-C  oxyCODONE-acetaminophen (PERCOCET/ROXICET) 5-325 MG tablet Take 1-2 tablets by mouth 2 (two) times daily as needed for  moderate pain or severe pain. Patient not taking: Reported on 09/23/2016 04/05/16   Mcarthur Rossetti, MD    Family History Family History  Problem Relation Age of Onset  . Heart disease Father     Vague not clearly cardiac    Social History Social History  Substance Use Topics  . Smoking status: Never Smoker  . Smokeless tobacco: Never Used  . Alcohol use No     Comment: socially     Allergies   Hydrocodone and Tramadol   Review of Systems Review of Systems  Constitutional: Positive for activity change, chills, diaphoresis, fatigue and fever.  HENT: Negative for congestion, sinus pressure and sore throat.   Eyes: Negative for pain and visual disturbance.  Respiratory: Negative for cough, shortness of breath and wheezing.   Cardiovascular: Negative for chest pain.  Gastrointestinal: Negative for abdominal pain, constipation, diarrhea, nausea and vomiting.  Genitourinary: Negative for dysuria, flank pain and frequency.  Musculoskeletal: Positive for back pain, myalgias and neck pain.  Skin: Negative for rash and wound.  Neurological: Positive for dizziness, light-headedness and headaches. Negative for syncope, weakness and numbness.  All other systems  reviewed and are negative.    Physical Exam Updated Vital Signs BP 122/81 (BP Location: Right Arm)   Pulse 93   Temp 98.9 F (37.2 C) (Oral)   Resp 16   Ht 5\' 5"  (1.651 m)   Wt 164 lb (74.4 kg)   LMP 09/20/2016 (Exact Date)   SpO2 100%   BMI 27.29 kg/m   Physical Exam  Constitutional: She is oriented to person, place, and time. She appears well-developed and well-nourished. No distress.  HENT:  Head: Normocephalic and atraumatic.  Mouth/Throat: Oropharynx is clear and moist.  Eyes: EOM are normal. Pupils are equal, round, and reactive to light.  Slight scleral icterus  Neck: Normal range of motion. Neck supple.  Tenderness to palpation of the posterior musculature of the neck. Questionable mild rigidity.   Cardiovascular: Normal rate and regular rhythm.  Exam reveals no gallop and no friction rub.   No murmur heard. Pulmonary/Chest: Effort normal and breath sounds normal. No respiratory distress. She has no wheezes. She has no rales. She exhibits no tenderness.  Abdominal: Soft. Bowel sounds are normal. There is no tenderness. There is no rebound and no guarding.  Musculoskeletal: Normal range of motion. She exhibits tenderness. She exhibits no edema.  Diffuse bilateral paraspinal lumbar tenderness. No midline thoracic or lumbar tenderness. Distal pulses are 2+.  Lymphadenopathy:    She has cervical adenopathy.  Neurological: She is alert and oriented to person, place, and time.  5/5 motor in all extremities. Sensation is fully intact. Patient speaks in clear voice. Alert and oriented 3.  Skin: Skin is warm and dry. Capillary refill takes less than 2 seconds. No rash noted. She is not diaphoretic. No erythema.  Psychiatric: She has a normal mood and affect. Her behavior is normal.  Nursing note and vitals reviewed.    ED Treatments / Results  Labs (all labs ordered are listed, but only abnormal results are displayed) Labs Reviewed  CBC WITH DIFFERENTIAL/PLATELET - Abnormal; Notable for the following:       Result Value   Hemoglobin 11.8 (*)    RDW 16.5 (*)    All other components within normal limits  COMPREHENSIVE METABOLIC PANEL - Abnormal; Notable for the following:    Anion gap 4 (*)    All other components within normal limits  URINALYSIS, ROUTINE W REFLEX MICROSCOPIC (NOT AT [facility]) - Abnormal; Notable for the following:    APPearance HAZY (*)    Hgb urine dipstick LARGE (*)    All other components within normal limits  URINE MICROSCOPIC-ADD ON - Abnormal; Notable for the following:    Squamous Epithelial / LPF 0-5 (*)    All other components within normal limits  CSF CELL COUNT WITH DIFFERENTIAL - Abnormal; Notable for the following:    RBC Count, CSF 8 (*)    WBC, CSF 66  (*)    Lymphs, CSF 92 (*)    Monocyte-Macrophage-Spinal Fluid 7 (*)    All other components within normal limits  CSF CULTURE  CULTURE, BLOOD (ROUTINE X 2)  CULTURE, BLOOD (ROUTINE X 2)  GLUCOSE, CSF  PROTEIN, CSF  CRYPTOCOCCAL ANTIGEN, CSF  I-STAT BETA HCG BLOOD, ED ([facility], [facility], [facility] ONLY)    EKG  EKG Interpretation None       Radiology Dg Chest 2 View  Result Date: 09/23/2016 CLINICAL DATA:  Fever for 5 day EXAM: CHEST  2 VIEW COMPARISON:  05/16/2016 FINDINGS: Normal heart size. Lungs clear. No pneumothorax. No pleural effusion. IMPRESSION: No active  cardiopulmonary disease. Electronically Signed   By: Marybelle Killings M.D.   On: 09/23/2016 09:32   Ct Head Wo Contrast  Result Date: 09/23/2016 CLINICAL DATA:  Frontal headache radiating posterior for 5 days. EXAM: CT HEAD WITHOUT CONTRAST TECHNIQUE: Contiguous axial images were obtained from the base of the skull through the vertex without intravenous contrast. COMPARISON:  03/28/2014 FINDINGS: Brain: No acute intracranial abnormality. Specifically, no hemorrhage, hydrocephalus, mass lesion, acute infarction, or significant intracranial injury. Vascular: No hyperdense vessel or unexpected calcification. Skull: No acute calvarial abnormality. Sinuses/Orbits: Visualized paranasal sinuses and mastoids clear. Orbital soft tissues unremarkable. Other: None IMPRESSION: Negative. Electronically Signed   By: Rolm Baptise M.D.   On: 09/23/2016 10:08   Dg Lumbar Puncture Fluoro Guide  Result Date: 09/23/2016 CLINICAL DATA:  Severe headache and fever. EXAM: DIAGNOSTIC LUMBAR PUNCTURE UNDER FLUOROSCOPIC GUIDANCE FLUOROSCOPY TIME:  Fluoroscopy Time:  0 minutes and 36 seconds Radiation Exposure Index (if provided by the fluoroscopic device): 2.40 mGy Number of Acquired Spot Images: 0 PROCEDURE: Informed consent was obtained from the patient prior to the procedure, including potential complications of headache, allergy, and pain. With the patient prone, the  lower back was prepped with Betadine. 1% Lidocaine was used for local anesthesia. Lumbar puncture was performed at the L3-4 level using a 20 gauge needle with return of clear CSF with an opening pressure of 18 cm water. 9 ml of CSF were obtained for laboratory studies. The patient tolerated the procedure well and there were no apparent complications. IMPRESSION: Fluoroscopic guided lumbar puncture with 9 cc of clear CSF obtained for appropriate laboratory evaluation. Opening pressure was 18 cm of water. Electronically Signed   By: Marijo Sanes M.D.   On: 09/23/2016 13:34    Procedures .Lumbar Puncture Date/Time: 09/23/2016 3:17 PM Performed by: Julianne Rice Authorized by: Lita Mains, Zenita Kister   Consent:    Consent obtained:  Written   Consent given by:  Patient   Alternatives discussed:  No treatment, delayed treatment, alternative treatment and observation Pre-procedure details:    Procedure purpose:  Diagnostic   Preparation: Patient was prepped and draped in usual sterile fashion   Anesthesia (see MAR for exact dosages):    Anesthesia method:  Local infiltration Procedure details:    Lumbar space:  L3-L4 interspace   Patient position:  Sitting   Needle gauge:  20   Needle type:  Spinal needle - Quincke tip   Needle length (in):  2.5   Ultrasound guidance: no     Number of attempts:  1 Post-procedure:    Puncture site:  Adhesive bandage applied Comments:     Unsuccessful LP attempt in the emergency department.   (including critical care time)  Medications Ordered in ED Medications  sodium chloride 0.9 % bolus 1,000 mL (0 mLs Intravenous Stopped 09/23/16 1126)  morphine 4 MG/ML injection 4 mg (4 mg Intravenous Given 09/23/16 1125)  ondansetron (ZOFRAN) injection 4 mg (4 mg Intravenous Given 09/23/16 1125)  lidocaine (PF) (XYLOCAINE) 1 % injection 5 mL (5 mLs Intradermal Given by Other 09/23/16 1328)     Initial Impression / Assessment and Plan / ED Course  I have reviewed  the triage vital signs and the nursing notes.  Pertinent labs & imaging results that were available during my care of the patient were reviewed by me and considered in my medical decision making (see chart for details).  Clinical Course   Patient with history of HIV and tuberculosis presents with headache, neck pain and fever  and chills. History of meningitis. She is well-appearing. She has a normal neurologic exam. Given her comorbidities will proceed with infectious disease workup. Will likely need a lumbar puncture to rule out meningitis. Patient is well-appearing and her symptoms have been present for 5 days. Do not believe that emergent antibiotics are needed prior to workup.  Discussed with Dr. Baxter Flattery who agrees with proceeding with LP to rule out meningitis. Discussed risk and benefits with patient. Consent will be obtained.  Unsuccessful attempt at all. Emergency department. Patient obtained LP under fluoroscopy guidance. Has elevation in her white blood cells 66. Likely viral versus aseptic meningitis. Discussed with family medicine service and will admit.  Final Clinical Impressions(s) / ED Diagnoses   Final diagnoses:  Cerebrospinal fluid abnormality    New Prescriptions New Prescriptions   No medications on file     Julianne Rice, MD 09/23/16 1519

## 2016-09-23 NOTE — ED Notes (Signed)
Pt being ruled out for meningitis. Pt on droplet precautions. Pt has no neuro deficits. Pt a&ox4. VSS. Pt ambulatory and independent.

## 2016-09-23 NOTE — ED Triage Notes (Signed)
Pt states she started having HA, neck pain, back pain about 6 days ago with fevers and chills; Pt denies n/v but states she gets dizzy sometimes; Pt a&ox 4 on arrival. Pt c/o at 8/10 more in the back of neck and lower back; Pt states she had fever of 100.0 at home and took OTC med; Pt speaking in full and complete sentences

## 2016-09-23 NOTE — ED Notes (Signed)
Pt ambulated to and from restroom; tolerated well 

## 2016-09-24 DIAGNOSIS — G039 Meningitis, unspecified: Secondary | ICD-10-CM

## 2016-09-24 DIAGNOSIS — R839 Unspecified abnormal finding in cerebrospinal fluid: Secondary | ICD-10-CM

## 2016-09-24 DIAGNOSIS — B20 Human immunodeficiency virus [HIV] disease: Secondary | ICD-10-CM

## 2016-09-24 LAB — COMPREHENSIVE METABOLIC PANEL
ALT: 17 U/L (ref 14–54)
AST: 18 U/L (ref 15–41)
Albumin: 3.4 g/dL — ABNORMAL LOW (ref 3.5–5.0)
Alkaline Phosphatase: 74 U/L (ref 38–126)
Anion gap: 7 (ref 5–15)
BUN: 5 mg/dL — ABNORMAL LOW (ref 6–20)
CO2: 24 mmol/L (ref 22–32)
Calcium: 8.6 mg/dL — ABNORMAL LOW (ref 8.9–10.3)
Chloride: 107 mmol/L (ref 101–111)
Creatinine, Ser: 0.75 mg/dL (ref 0.44–1.00)
GFR calc Af Amer: >60 mL/min (ref >60)
GFR calc non Af Amer: >60 mL/min (ref >60)
Glucose, Bld: 105 mg/dL — ABNORMAL HIGH (ref 65–99)
Potassium: 4.2 mmol/L (ref 3.5–5.1)
Sodium: 138 mmol/L (ref 135–145)
Total Bilirubin: 0.6 mg/dL (ref 0.3–1.2)
Total Protein: 6.8 g/dL (ref 6.5–8.1)

## 2016-09-24 LAB — HERPES SIMPLEX VIRUS(HSV) DNA BY PCR
HSV 1 DNA: NEGATIVE
HSV 2 DNA: NEGATIVE

## 2016-09-24 LAB — CBC
HCT: 34.8 % — ABNORMAL LOW (ref 36.0–46.0)
Hemoglobin: 11.4 g/dL — ABNORMAL LOW (ref 12.0–15.0)
MCH: 28.8 pg (ref 26.0–34.0)
MCHC: 32.8 g/dL (ref 30.0–36.0)
MCV: 87.9 fL (ref 78.0–100.0)
Platelets: 188 10*3/uL (ref 150–400)
RBC: 3.96 MIL/uL (ref 3.87–5.11)
RDW: 16.6 % — ABNORMAL HIGH (ref 11.5–15.5)
WBC: 3.8 10*3/uL — ABNORMAL LOW (ref 4.0–10.5)

## 2016-09-24 LAB — PATHOLOGIST SMEAR REVIEW

## 2016-09-24 MED ORDER — HYDROCORTISONE 10 MG PO TABS
10.0000 mg | ORAL_TABLET | Freq: Every morning | ORAL | Status: DC
  Administered 2016-09-24 – 2016-09-25 (×2): 10 mg via ORAL
  Filled 2016-09-24 (×4): qty 1

## 2016-09-24 MED ORDER — HYDROCORTISONE 5 MG PO TABS
5.0000 mg | ORAL_TABLET | Freq: Every day | ORAL | Status: DC
  Administered 2016-09-24: 5 mg via ORAL
  Filled 2016-09-24: qty 1

## 2016-09-24 NOTE — Progress Notes (Signed)
Family Medicine Teaching Service Daily Progress Note Intern Pager: [phone]  Patient name: Kelsey Rodriguez Medical record number: [id number] Date of birth: 01-26-1991 Age: 25 y.o. Gender: female  Primary Care Provider: Everrett Coombe, MD Consultants: ID Code Status: FULL  Pt Overview and Major Events to Date:  Admit 10/10  Assessment and Plan: Kelsey Rodriguez is a 25 y.o. female presenting with headache, neck pain and back pain x past week . PMH is significiant for TB , HIV, Avascular necrosis, abnormal uterine bleeding and episodes of meningitis in the past.    Aseptic Meningitis.  Treating as HSV meningitis, per ID recs.  -Follows with Dr. Baxter Flattery (Infectious disease), appreciate recs.  Discussed patient's care with her.  Agrees with checking for HSV and initiating Acyclovir.  Does not recommend empiric coverage for bacterial meningitis, as CSF does not suggest bacterial infection.  Plan to call her once HSV results and tailor plan accordingly at that time.  No formal consult needed at this time, given good CD4 count. -Continue Acyclovir 745 mg Q8 (10mg /kg) -Zofran 4 mg Q6 prn -Tylenol 650 mg prn -CSF cx pending -HSV pcr pending -Blood cx pending -Droplet precautions -monitor for fevers  Hx of Adrenal insufficiency -has not been taking her steroid medications for quite some time -has had some lower SBPs overnight -Start Hydrocortisone today and monitor BP closely -consider discontinuing tomorrow if stable  HIV. Last CD4 count 650 in September with viral load 25. Currently stable on medications. States she is compliant.   -continue home meds : Genvoya (elvitegravir 150 -cobicistat 150 -emtricitabine 200 -tenofovir 10) -consider ID consult if patient clinically worsens  TB. With history of TB, treated 2013.  CXR in ED showing no active cardiopulmonary disease.  Stable.   FEN/GI: Heart healthy, SLIV Prophylaxis: Lovenox  Disposition: Admit to med-surg floor for treatment  Subjective:   Patient complaining of headache this morning.  States she has some minimal neck stiffness but has improved somewhat.  Otherwise feels fine and with no other issues at current time.   Objective: Temp:  [98.2 F (36.8 C)-100 F (37.8 C)] 98.2 F (36.8 C) (10/11 0528) Pulse Rate:  [51-107] 98 (10/11 0528) Resp:  [12-23] 18 (10/11 0528) BP: (103-127)/(65-94) 103/70 (10/11 0528) SpO2:  [99 %-100 %] 100 % (10/11 0528)   Physical Exam: General: AA female laying in hospital bed in NAD Eyes: EOMI, PERRL, sclera white ENTM: MMM Neck: supple, no rigidity, fair ROM with minimal tenderness Cardiovascular: RRR, no MRG Respiratory: CTA B/L, normal effort breathing on RA Gastrointestinal: soft, NT ND, no masses, +bs MSK: FROMx4, WWP, no edema on LE Derm: no rashes or bruises noted,  No suspicious lesions Neuro: AAOx3, no focal deficits Psych: pleasant, mood appropriate  Laboratory:  Recent Labs Lab 09/23/16 0825 09/24/16 0207  WBC 4.4 3.8*  HGB 11.8* 11.4*  HCT 36.5 34.8*  PLT 195 188    Recent Labs Lab 09/23/16 0825 09/24/16 0207  NA 138 138  K 3.9 4.2  CL 110 107  CO2 24 24  BUN 6 5*  CREATININE 0.78 0.75  CALCIUM 9.1 8.6*  PROT 7.5 6.8  BILITOT 0.3 0.6  ALKPHOS 89 74  ALT 19 17  AST 24 18  GLUCOSE 87 105*   Imaging/Diagnostic Tests: Dg Chest 2 View  Result Date: 09/23/2016 CLINICAL DATA:  Fever for 5 day EXAM: CHEST  2 VIEW COMPARISON:  05/16/2016 FINDINGS: Normal heart size. Lungs clear. No pneumothorax. No pleural effusion. IMPRESSION: No active cardiopulmonary disease. Electronically Signed  By: Marybelle Killings M.D.   On: 09/23/2016 09:32   Ct Head Wo Contrast  Result Date: 09/23/2016 CLINICAL DATA:  Frontal headache radiating posterior for 5 days. EXAM: CT HEAD WITHOUT CONTRAST TECHNIQUE: Contiguous axial images were obtained from the base of the skull through the vertex without intravenous contrast. COMPARISON:  03/28/2014 FINDINGS: Brain: No acute intracranial  abnormality. Specifically, no hemorrhage, hydrocephalus, mass lesion, acute infarction, or significant intracranial injury. Vascular: No hyperdense vessel or unexpected calcification. Skull: No acute calvarial abnormality. Sinuses/Orbits: Visualized paranasal sinuses and mastoids clear. Orbital soft tissues unremarkable. Other: None IMPRESSION: Negative. Electronically Signed   By: Rolm Baptise M.D.   On: 09/23/2016 10:08   Dg Lumbar Puncture Fluoro Guide  Result Date: 09/23/2016 CLINICAL DATA:  Severe headache and fever. EXAM: DIAGNOSTIC LUMBAR PUNCTURE UNDER FLUOROSCOPIC GUIDANCE FLUOROSCOPY TIME:  Fluoroscopy Time:  0 minutes and 36 seconds Radiation Exposure Index (if provided by the fluoroscopic device): 2.40 mGy Number of Acquired Spot Images: 0 PROCEDURE: Informed consent was obtained from the patient prior to the procedure, including potential complications of headache, allergy, and pain. With the patient prone, the lower back was prepped with Betadine. 1% Lidocaine was used for local anesthesia. Lumbar puncture was performed at the L3-4 level using a 20 gauge needle with return of clear CSF with an opening pressure of 18 cm water. 9 ml of CSF were obtained for laboratory studies. The patient tolerated the procedure well and there were no apparent complications. IMPRESSION: Fluoroscopic guided lumbar puncture with 9 cc of clear CSF obtained for appropriate laboratory evaluation. Opening pressure was 18 cm of water. Electronically Signed   By: Marijo Sanes M.D.   On: 09/23/2016 13:34   Lovenia Kim, MD 09/24/2016, 8:15 AM PGY-1, [facility] Intern pager: [phone], text pages welcome

## 2016-09-24 NOTE — Care Management Note (Signed)
Case Management Note  Patient Details  Name: Kelsey Rodriguez MRN: [id number] Date of Birth: Nov 14, 1991  Subjective/Objective:   Pt in with aseptic meningitis. She is from home alone.                  Action/Plan: Plan is for patient to return home once medically stable. CM following for d/c needs.   Expected Discharge Date:                  Expected Discharge Plan:  Home/Self Care  In-House Referral:     Discharge planning Services     Post Acute Care Choice:    Choice offered to:     DME Arranged:    DME Agency:     HH Arranged:    HH Agency:     Status of Service:  In process, will continue to follow  If discussed at Long Length of Stay Meetings, dates discussed:    Additional Comments:  Pollie Friar, RN 09/24/2016, 10:28 AM

## 2016-09-25 ENCOUNTER — Encounter (HOSPITAL_COMMUNITY): Payer: Self-pay | Admitting: General Practice

## 2016-09-25 LAB — CBC
HCT: 34.6 % — ABNORMAL LOW (ref 36.0–46.0)
Hemoglobin: 11.3 g/dL — ABNORMAL LOW (ref 12.0–15.0)
MCH: 28.5 pg (ref 26.0–34.0)
MCHC: 32.7 g/dL (ref 30.0–36.0)
MCV: 87.4 fL (ref 78.0–100.0)
Platelets: 188 10*3/uL (ref 150–400)
RBC: 3.96 MIL/uL (ref 3.87–5.11)
RDW: 16.2 % — ABNORMAL HIGH (ref 11.5–15.5)
WBC: 3.8 10*3/uL — ABNORMAL LOW (ref 4.0–10.5)

## 2016-09-25 LAB — BASIC METABOLIC PANEL
Anion gap: 6 (ref 5–15)
BUN: <5 mg/dL — ABNORMAL LOW (ref 6–20)
CO2: 23 mmol/L (ref 22–32)
Calcium: 8.8 mg/dL — ABNORMAL LOW (ref 8.9–10.3)
Chloride: 108 mmol/L (ref 101–111)
Creatinine, Ser: 0.69 mg/dL (ref 0.44–1.00)
GFR calc Af Amer: >60 mL/min (ref >60)
GFR calc non Af Amer: >60 mL/min (ref >60)
Glucose, Bld: 89 mg/dL (ref 65–99)
Potassium: 3.6 mmol/L (ref 3.5–5.1)
Sodium: 137 mmol/L (ref 135–145)

## 2016-09-25 NOTE — Discharge Instructions (Signed)
You were hospitalized for headaches and stiff neck and were found to have meningitis.  We ruled out a viral meningitis called HSV meningitis and you were treated with antivirals in the meantime.  Since it is not bacterial, no antibiotics will be needed.  However, upon discharge, supportive care and rest with plenty of fluids will help.  You have a follow up appointment tomorrow with Dr. Burr Medico.  It was very nice to meet you and I'm glad you are feeling better!

## 2016-09-25 NOTE — Discharge Summary (Signed)
[facility] Discharge Summary  Patient name: Kelsey Rodriguez Medical record number: [id number] Date of birth: 1991/11/09 Age: 25 y.o. Gender: female Date of Admission: 09/23/2016  Date of Discharge: 09/25/2016 Admitting Physician: Lupita [name], MD  Primary Care Provider: Everrett Coombe, MD Consultants: ID  Indication for Hospitalization: headache  Discharge Diagnoses/Problem List:  Meningitis HIV  Disposition: Home  Discharge Condition: Stable  Discharge Exam:  General: AA female, in NAD Eyes: EOMI, PERRL, sclera white ENTM: MMM Neck: supple, no rigidity, fair ROM with minimal tenderness Cardiovascular: RRR, no MRG Respiratory: CTA B/L, normal effort breathing on RA Gastrointestinal: soft, NT ND, no masses, +bs MSK: FROMx4, WWP, no edema on LE Derm: no rashes or bruises noted, No suspicious lesions Neuro: AAOx3, no focal deficits Psych: pleasant, mood appropriate  Brief Hospital Course:  25 yo F presented to ED with headache, neck pain and back pain present x past week with subjective fevers and chills at home.  Afebrile in ED.  On admission, clinically well appearing with VSS.  LP with 66 wbc in CSF, elevated lymphocytes, protein glucose normal.  Cryptococcal antigen negative.  Gram stain of fluid revealed no organisms.  CT head negative for acute processes.  Negative Kernig and Brudinski sign on exam.  No focal findings.  Initially treating as HSV meningitis, per ID recs as CSF did not suggest a bacterial infection.    HSV PCR was ordered and Acyclovir was administered.  ID was consulted for recs.  The following day, HSV PCR returned negative and Acyclovir was discontinued.  Likely aseptic meningitis.  Patient remained afebrile and clinically improving.  Stated she still had some headache but neck pain and back pain much improved.  Supportive care advised upon discharge.  Blood and CSF cx followed for 48 hours to ensure negative.  Cultures negative at 48  hours and patient discharged home.  At time of discharge, patient was stable and was scheduled for outpatient visit with PCP Dr. Burr Medico.  Issues for Follow Up:  1. Follow cx (blood and CSF) to ensure result negative in final read.  So far, NG2D.   Significant Procedures: LP  Significant Labs and Imaging:   Recent Labs Lab 09/23/16 0825 09/24/16 0207 09/25/16 0313  WBC 4.4 3.8* 3.8*  HGB 11.8* 11.4* 11.3*  HCT 36.5 34.8* 34.6*  PLT 195 188 188    Recent Labs Lab 09/23/16 0825 09/24/16 0207 09/25/16 0313  NA 138 138 137  K 3.9 4.2 3.6  CL 110 107 108  CO2 24 24 23   GLUCOSE 87 105* 89  BUN 6 5* <5*  CREATININE 0.78 0.75 0.69  CALCIUM 9.1 8.6* 8.8*  ALKPHOS 89 74  --   AST 24 18  --   ALT 19 17  --   ALBUMIN 3.8 3.4*  --    Results/Tests Pending at Time of Discharge: Final read blood cx, CSF cx  Discharge Medications:    Medication List    STOP taking these medications   hydrocortisone 5 MG tablet Commonly known as:  CORTEF     TAKE these medications   acetaminophen 325 MG tablet Commonly known as:  TYLENOL Take 2 tablets (650 mg total) by mouth every 6 (six) hours as needed.   Darunavir Ethanolate 800 MG tablet Commonly known as:  PREZISTA Take 1 tablet (800 mg total) by mouth daily with breakfast.   elvitegravir-cobicistat-emtricitabine-tenofovir 150-150-200-10 MG Tabs tablet Commonly known as:  GENVOYA Take 1 tablet by mouth daily with breakfast.  estradiol 2 MG tablet Commonly known as:  ESTRACE Take 1 tablet (2 mg total) by mouth daily. X 7 days only for vaginal bleeding   lidocaine 5 % Commonly known as:  LIDODERM Place 1 patch onto the skin daily. Remove & Discard patch within 12 hours or as directed by MD   meclizine 32 MG tablet Commonly known as:  ANTIVERT Take 1 tablet (32 mg total) by mouth 3 (three) times daily as needed. If needed for "room spinning" dizziness   norgestimate-ethinyl estradiol 0.25-35 MG-MCG tablet Commonly known as:   SPRINTEC 28 Take 1 tablet by mouth daily.   ondansetron 4 MG disintegrating tablet Commonly known as:  ZOFRAN ODT Take 1 tablet (4 mg total) by mouth every 8 (eight) hours as needed for nausea or vomiting.   oxyCODONE-acetaminophen 5-325 MG tablet Commonly known as:  PERCOCET/ROXICET Take 1-2 tablets by mouth 2 (two) times daily as needed for moderate pain or severe pain.      Discharge Instructions: Please refer to Patient Instructions section of EMR for full details.  Patient was counseled important signs and symptoms that should prompt return to medical care, changes in medications, dietary instructions, activity restrictions, and follow up appointments.   Follow-Up Appointments: Follow-up Information    Everrett Coombe, MD. Go on 09/26/2016.   Contact information: [place] [address] [phone]        Carlyle Basques, MD .   Specialty:  Infectious Diseases Contact information: [address] [place] [address] [phone]          Lovenia Kim, MD 09/25/2016, 9:52 PM PGY-1, [place]

## 2016-09-25 NOTE — Progress Notes (Signed)
Patient is discharged from room 5C09 at this time. Alert and in stable condition. IV site d/c'd and instructions read to patient with understanding verbalized. Left unit via wheelchair with all belongings at side.

## 2016-09-25 NOTE — Progress Notes (Signed)
   CC: aseptic meningitis hospital follow up  HPI: Kelsey Rodriguez is a 25 y.o. female with PMHx HIV, TB, AUB, and previous meningitis admitted for concern of possible meningitis with recent hospitalization for aseptic meningitis (10/10-10-12) discharged yesterday.  She presents to [facility] today for follow up.    Headache She states that since discharge she has been running errands all day (went to the dentist this morning and now is in our clinic). - She continues to have aching headache on the top of her head that is slightly improved from discharge from the hospital yesterday. - continues to have chills which she has treated with tylenol - endorses photosensitivity - endorses lower back pain at site of lumbar puncture  - endorses some dizziness which has been intermittent throughout today - No nausea or vomiting, no weakness or changes in sensation  Contraception On review of her medications she notes that she is no longer using depo provera and her last injection was >3 months ago. She notes she has been using condoms when sexually active.    Review of Symptoms: See HPI for ROS.   CC, SH/smoking status, and VS noted.  Objective: BP 110/82   Pulse 81   Temp 98.3 F (36.8 C) (Oral)   Ht 5\' 5"  (1.651 m)   Wt 166 lb (75.3 kg)   LMP 09/20/2016 (Exact Date)   SpO2 99%   BMI 27.62 kg/m  GEN: NAD, alert, cooperative, and pleasant. EYE: no conjunctival injection, pupils equally round and reactive to light, no scleral icterus ENMT: normal tympanic light reflex NECK: full ROM, no nuchal rigidity appreciated SKIN: warm and dry, no rashes or lesions NEURO: II-XII grossly intact, normal gait, peripheral sensation intact and equal bilaterally, strength 5/5 and equal in four extremities PSYCH: AAOx3, appropriate affect  Hospital Cultures CSF: No growth for three days Blood cultures: No growth for three days  Assessment and plan:  Aseptic meningitis Overall improved, however patient  continues to be symptomatic with headaches and chills. She was just discharged yesterday and has been very active since discharge. - Recommend that she rests for this weekend and increases activity gradually to allow time to heal - Continue tylenol PRN chills and headache, can use ibuprofen for breakthrough pain.  Did not prescribe stronger NSAID given Genvoya use and risk for renal toxicity.  - Return precautions discussed  - Asked patient to follow up in 1 week, expect headaches to be much improved at that time.  Menorrhagia - Patient endorses AUB and menorrhagia while on depo provera which she now stopped - endorses using barrier contraception - may discuss additional methods of birth control at follow up visit when she is feeling better. Patient open to mirena or nexplanon which were very briefly mentioned.   Everrett Coombe, MD,MS,  PGY1 09/26/2016 4:35 PM

## 2016-09-25 NOTE — Progress Notes (Signed)
Family Medicine Teaching Service Daily Progress Note Intern Pager: [phone]  Patient name: Kelsey Rodriguez Medical record number: [id number] Date of birth: 03/24/1991 Age: 25 y.o. Gender: female  Primary Care Provider: Everrett Coombe, MD Consultants: ID Code Status: FULL  Pt Overview and Major Events to Date:  Admit 10/10  Assessment and Plan: Kelsey Rodriguez is a 25 y.o. female presenting with headache, neck pain and back pain x past week . PMH is significiant for TB , HIV, Avascular necrosis, abnormal uterine bleeding and episodes of meningitis in the past.    Aseptic Meningitis.  Initially treating as HSV meningitis, per ID recs. No empiric coverage for bacterial meningitis recommended, as CSF does not suggest bacterial infection.  HSV PCR negative. Discussed with Dr. Baxter Flattery this AM.  Likely aseptic meningitis.  Patient has remained afebrile and is clinically improving.  Supportive care.  Monitor at least one more day until Bcx negative x48h. Then ok to discharge home. -Follows with Dr. Baxter Flattery (Infectious disease), appreciate assistance -Discontinue Acyclovir 745 mg Q8 (10mg /kg) -Zofran 4 mg Q6 prn  -Tylenol 650 mg prn -CSF cx NG2D -Blood cx NG2D  Hx of Adrenal insufficiency -has not been taking her steroid medications for quite some time.  Had some lower SBPs overnight 10/11 -Started Hydrocortisone 10/11 -Monitor BPs closely --> have been stable overnight -discontinue this am  HIV. Last CD4 count 650 in September with viral load 25. Currently stable on medications. States she is compliant.   -continue home meds : Genvoya (elvitegravir 150 -cobicistat 150 -emtricitabine 200 -tenofovir 10) -consider ID consult if patient clinically worsens  TB. With history of TB, treated 2013.  CXR in ED showing no active cardiopulmonary disease.  Stable.   FEN/GI: Heart healthy, SLIV Prophylaxis: Lovenox  Disposition: Home today, as CSF and Bcx negative x2 days  Subjective:  Patient complaining  of a little headache overnight. States she has some minimal neck stiffness but has good ROM without pain.  Feels well and no other issues at current time.  Objective: Temp:  [98.4 F (36.9 C)-99.9 F (37.7 C)] 98.8 F (37.1 C) (10/12 0504) Pulse Rate:  [87-99] 87 (10/12 0504) Resp:  [16-20] 20 (10/12 0504) BP: (104-118)/(64-88) 110/64 (10/12 0504) SpO2:  [100 %] 100 % (10/12 0504) Weight:  [164 lb 0.4 oz (74.4 kg)] 164 lb 0.4 oz (74.4 kg) (10/12 0930)   Physical Exam: General: AA female, in NAD Eyes: EOMI, PERRL, sclera white ENTM: MMM Neck: supple, no rigidity, fair ROM with minimal tenderness Cardiovascular: RRR, no MRG Respiratory: CTA B/L, normal effort breathing on RA Gastrointestinal: soft, NT ND, no masses, +bs MSK: FROMx4, WWP, no edema on LE Derm: no rashes or bruises noted,  No suspicious lesions Neuro: AAOx3, no focal deficits Psych: pleasant, mood appropriate  Laboratory:  Recent Labs Lab 09/23/16 0825 09/24/16 0207 09/25/16 0313  WBC 4.4 3.8* 3.8*  HGB 11.8* 11.4* 11.3*  HCT 36.5 34.8* 34.6*  PLT 195 188 188    Recent Labs Lab 09/23/16 0825 09/24/16 0207 09/25/16 0313  NA 138 138 137  K 3.9 4.2 3.6  CL 110 107 108  CO2 24 24 23   BUN 6 5* <5*  CREATININE 0.78 0.75 0.69  CALCIUM 9.1 8.6* 8.8*  PROT 7.5 6.8  --   BILITOT 0.3 0.6  --   ALKPHOS 89 74  --   ALT 19 17  --   AST 24 18  --   GLUCOSE 87 105* 89   Imaging/Diagnostic Tests: No results found. Enis Slipper  Reesa Chew, MD 09/25/2016, 2:09 PM PGY-1, [place] Intern pager: [phone], text pages welcome

## 2016-09-25 NOTE — Care Management Note (Signed)
Case Management Note  Patient Details  Name: Kelsey Rodriguez MRN: [id number] Date of Birth: 25-Aug-1991  Subjective/Objective:                    Action/Plan: Pt discharging home with self care. Pt with Medicaid for prescriptions. No further needs per CM.   Expected Discharge Date:                  Expected Discharge Plan:  Home/Self Care  In-House Referral:     Discharge planning Services     Post Acute Care Choice:    Choice offered to:     DME Arranged:    DME Agency:     HH Arranged:    [place] Agency:     Status of Service:  Completed, signed off  If discussed at [employer] of Stay Meetings, dates discussed:    Additional Comments:  Pollie Friar, RN 09/25/2016, 2:59 PM

## 2016-09-26 ENCOUNTER — Ambulatory Visit (INDEPENDENT_AMBULATORY_CARE_PROVIDER_SITE_OTHER): Payer: Medicaid Other | Admitting: Student in an Organized Health Care Education/Training Program

## 2016-09-26 ENCOUNTER — Encounter: Payer: Self-pay | Admitting: Student in an Organized Health Care Education/Training Program

## 2016-09-26 DIAGNOSIS — G030 Nonpyogenic meningitis: Secondary | ICD-10-CM | POA: Diagnosis present

## 2016-09-26 DIAGNOSIS — N921 Excessive and frequent menstruation with irregular cycle: Secondary | ICD-10-CM

## 2016-09-26 LAB — CSF CULTURE W GRAM STAIN: Culture: NO GROWTH

## 2016-09-26 LAB — CSF CULTURE

## 2016-09-26 NOTE — Patient Instructions (Addendum)
It was a pleasure seeing you today in our clinic. Today we discussed your recent hospitalization. Here is the treatment plan we have discussed and agreed upon together: - continue to use tylenol for headaches, you can use ibuprofen for breakthrough pain. - please take it easy this weekend! Rest and allow yourself to recover! - continue to hydrate yourself. - We expect you to continue to improve and get better. Please schedule a hospital follow up for one week from now so that we can be sure you are moving in the right direction.  - If you develop severely worsened headache that is significantly worse, please come in to be evaluated.  Our clinic's number is [phone]. Please call with questions or concerns about what we discussed today.  - Dr. Burr Medico

## 2016-09-26 NOTE — Assessment & Plan Note (Signed)
-   Patient endorses AUB and menorrhagia while on depo provera which she now stopped - endorses using barrier contraception - may discuss additional methods of birth control at follow up visit when she is feeling better. Patient open to mirena or nexplanon which were very briefly mentioned.

## 2016-09-26 NOTE — Assessment & Plan Note (Signed)
Overall improved, however patient continues to be symptomatic with headaches and chills. She was just discharged yesterday and has been very active since discharge. - Recommend that she rests for this weekend and increases activity gradually to allow time to heal - Continue tylenol PRN chills and headache, can use ibuprofen for breakthrough pain.  Did not prescribe stronger NSAID given Genvoya use and risk for renal toxicity.  - Return precautions discussed  - Asked patient to follow up in 1 week, expect headaches to be much improved at that time.

## 2016-09-28 ENCOUNTER — Other Ambulatory Visit: Payer: Medicaid Other

## 2016-09-28 LAB — CULTURE, BLOOD (ROUTINE X 2)
Culture: NO GROWTH
Culture: NO GROWTH

## 2016-10-01 ENCOUNTER — Encounter: Payer: Self-pay | Admitting: Family Medicine

## 2016-10-01 ENCOUNTER — Ambulatory Visit (INDEPENDENT_AMBULATORY_CARE_PROVIDER_SITE_OTHER): Payer: No Typology Code available for payment source | Admitting: Orthopaedic Surgery

## 2016-10-09 NOTE — Progress Notes (Deleted)
   CC: ***  HPI: Kelsey Rodriguez is a 25 y.o. female with PMH significant for HIV, TB, AUB, and previous meningitis who was recently discharged from the hospital (10/10-10/12) where she was admitted for concern of possible meningitis. She presents to [facility] today for follow up and contraception counseling.     Review of Symptoms: See HPI for ROS.   CC, SH/smoking status, and VS noted.  Objective: LMP 09/20/2016 (Exact Date)  GEN: NAD, alert, cooperative, and pleasant.*** EYE: no conjunctival injection, pupils equally round and reactive to light ENMT: normal tympanic light reflex, no nasal polyps,no rhinorrhea, no pharyngeal erythema or exudates NECK: full ROM, no thyromegally RESPIRATORY: clear to auscultation bilaterally with no wheezes, rhonchi or rales, good effort CV: RRR, no m/r/g, no peripheral edema GI: soft, non-tender, non-distended, normoactive bowel sounds, no hepatosplenomegaly SKIN: warm and dry, no rashes or lesions NEURO: II-XII grossly intact, normal gait, peripheral sensation intact PSYCH: AAOx3, appropriate affect  Assessment and plan:  No problem-specific Assessment & Plan notes found for this encounter.   No orders of the defined types were placed in this encounter.   No orders of the defined types were placed in this encounter.    Everrett Coombe, MD,MS,  PGY1 10/09/2016 11:28 PM

## 2016-10-10 ENCOUNTER — Ambulatory Visit: Payer: Medicaid Other | Admitting: Student in an Organized Health Care Education/Training Program

## 2016-10-12 ENCOUNTER — Ambulatory Visit
Admission: RE | Admit: 2016-10-12 | Discharge: 2016-10-12 | Disposition: A | Discharge status: Home / Self Care | Payer: Medicaid Other | Source: Ambulatory Visit | Attending: Physician Assistant | Admitting: Physician Assistant

## 2016-10-12 DIAGNOSIS — M545 Low back pain: Secondary | ICD-10-CM

## 2016-10-23 ENCOUNTER — Encounter (INDEPENDENT_AMBULATORY_CARE_PROVIDER_SITE_OTHER): Payer: Self-pay | Admitting: Physician Assistant

## 2016-10-23 ENCOUNTER — Ambulatory Visit (INDEPENDENT_AMBULATORY_CARE_PROVIDER_SITE_OTHER): Payer: Medicaid Other | Admitting: Physician Assistant

## 2016-10-23 DIAGNOSIS — M545 Low back pain, unspecified: Secondary | ICD-10-CM

## 2016-10-23 NOTE — Progress Notes (Signed)
Office Visit Note   Patient: Kelsey Rodriguez           Date of Birth: 05-Oct-1991           MRN: [id number] Visit Date: 10/23/2016              Requested by: Everrett Coombe, MD [place], [place] [address] PCP: Everrett Coombe, MD   Assessment & Plan: Visit Diagnoses:  1. Acute bilateral low back pain without sciatica     Plan: Epidural steroid of the lumbar spine. See her back at approximately 1 month later check progress lack of. She'll continue use Tylenol for pain. Did ask about pain medication do not feel that this is appropriate especially with headaches of unknown origin.  Follow-Up Instructions: Return in about 4 weeks (around 11/20/2016).   Orders:  No orders of the defined types were placed in this encounter.  No orders of the defined types were placed in this encounter.     Procedures: No procedures performed   Clinical Data: No additional findings.   Subjective: Chief Complaint  Patient presents with  . Lower Back - Pain, Follow-up    Patient comes in today to go over MRI Report for Lumbar Spine. MRI was done 10/12/16. She is not any better. Still having pain. Weakness in left side. She states she is having worse pain in lower back but has pain all over her back. She also states she has a lot of headaches. Takes Aleve. She states she continues to have low back pain but no radicular symptoms down either leg. States she does feel that her low back pain is associated with her headaches which she states is her main complaint today. She recently hospitalized on 09/23/2016 for headache low back pain.Was discharged 09/25/16 with the diagnosis of aseptic meningitis. MR of the lumbar spine dated 10/13/2016 was read as essentially a normal MRI with mild right L5 foraminal stenosis with mild disc bulge at this level. Personally reviewed the MRI she does have a mild L5 right foraminal stenosis with a mild disc bulge but also foraminal stenosis is noted at L4 also mild.       Review of Systems See HPI  Objective: Vital Signs: LMP 09/20/2016 (Exact Date)   Physical Exam  Constitutional: She is oriented to person, place, and time. She appears well-developed and well-nourished. No distress.  Pulmonary/Chest: Effort normal.  Neurological: She is alert and oriented to person, place, and time.  Psychiatric: She has a normal mood and affect.    Back Exam   Tenderness  The patient is experiencing no tenderness.   Range of Motion  Extension: normal  Flexion: 30   Tests  Straight leg raise right: negative Straight leg raise left: negative  Comments:  Tight hamstrings.      Specialty Comments:  No specialty comments available.  Imaging: No results found.   PMFS History: Patient Active Problem List   Diagnosis Date Noted  . CSF abnormal 09/23/2016  . Aseptic meningitis   . 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 Avascular necrosis of bone of right hip ([place]) 04/04/2016  . 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 Avascular necrosis of bone of left hip ([place]) 12/14/2015  . 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 Overweight (BMI 25.0-29.9) 04/20/2015  . Seasonal allergies 03/20/2015  . Menorrhagia 03/19/2015  . Back pain of lumbar region with sciatica 02/12/2015  .  Dizziness and giddiness 01/23/2015  . Primary adrenal insufficiency ([place]) 01/03/2015  . Fatigue 09/25/2014  . Chest pain 07/07/2013  . Laceration of ankle, right 11/18/2012  . 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 Arthralgia 05/20/2012  . HIV (human immunodeficiency virus infection) ([place]) 03/16/2012  . Dysphagia 03/11/2012  . 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 Iron deficiency anemia 03/11/2012  . Reflux esophagitis 03/11/2012   Past Medical History:  Diagnosis Date  . 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 Adrenal insufficiency ([place])   . Anemia of chronic disease 03/11/2012  .  Bell's palsy 08/26/2013  . 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 Chronic back pain   . Chronic leg pain    bilateral knees, ankles  . Fatigue   . 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 HIV (human immunodeficiency virus infection) ([place]) 02/2012  . Laceration of ankle, right 11/18/2012  . Lumbar radiculopathy   . Pelvic pain   . Reflux esophagitis 03/11/2012  . Tuberculosis   . 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 Vertigo     Family History  Problem Relation Age of Onset  . Heart disease Father     Vague not clearly cardiac    Past Surgical History:  Procedure Laterality Date  . APPENDECTOMY  ~ 2000  . DILATION AND CURETTAGE OF UTERUS  2008  . ESOPHAGOGASTRODUODENOSCOPY  03/11/2012   Procedure: ESOPHAGOGASTRODUODENOSCOPY (EGD);  Surgeon: Lafayette Dragon, MD;  Location[facility] ENDOSCOPY;  Service: Endoscopy;  Laterality: N/A;  . ESOPHAGOGASTRODUODENOSCOPY N/A 03/07/2014   Procedure: ESOPHAGOGASTRODUODENOSCOPY (EGD);  Surgeon: Gatha Mayer, MD;  Location: [facility] ENDOSCOPY;  Service: Endoscopy;  Laterality: N/A;  . LUNG BIOPSY  02/2012  . TOTAL HIP ARTHROPLASTY Left 12/14/2015   Procedure: LEFT TOTAL HIP ARTHROPLASTY ANTERIOR APPROACH;  Surgeon: Mcarthur Rossetti, MD;  Location: [facility] ORS;  Service: Orthopedics;  Laterality: Left;  . TOTAL HIP ARTHROPLASTY Right 04/04/2016   Procedure: RIGHT TOTAL HIP ARTHROPLASTY ANTERIOR APPROACH;  Surgeon: Mcarthur Rossetti, MD;  Location: [facility] ORS;  Service: Orthopedics;  Laterality: Right;   Social History   Occupational History  . Not on file.   Social History Main Topics  . Smoking status: Never Smoker  . Smokeless tobacco: Never Used  . Alcohol use No     Comment: socially  . Drug use: No  . Sexual activity: Not Currently    Partners: Male    Birth control/ protection: None     Comment: pt. given condoms

## 2016-10-23 NOTE — Addendum Note (Signed)
Addended by: Precious Bard on: 10/23/2016 04:45 PM   Modules accepted: Orders

## 2016-10-27 NOTE — Progress Notes (Signed)
   CC: Arm pain, headache  HPI: Kelsey Rodriguez is a 25 y.o. female with past medical history significant for HIV, TB, AUB, and previous meningitis (s/p admission 09/2016) presents to [facility] today with headache and arm pain.   Headache - frontal, daily - takes aleve nightly - headache improves in 30 mins with aleve - no photosensitivity, no rhinorrhea, no hx trauma - did have recent hospitalization for aseptic meningitis - no recent fevers or night sweats  Left Arm Pain - shooting pain from neck down arm, daily, nothing makes it better, worse with activity - has bothered patients for at least two months (though documented 6 months ago) - has difficulty pushing the vacuum - hurts more with lying down, arm hurts more with a deep breath - patient believes related to lung biopsy she had done on that side    Review of Symptoms:  See HPI for ROS.   CC, SH/smoking status, and VS noted.  Objective: BP 104/62   Pulse 74   Ht 5\' 5"  (1.651 m)   Wt 72.6 kg (160 lb)   LMP 09/21/2016 (Within Days)   BMI 26.63 kg/m  GEN: NAD, alert, cooperative, and pleasant. RESPIRATORY: clear to auscultation bilaterally with no wheezes, rhonchi or rales, good effort CV: RRR, no m/r/g, no peripheral edema NEURO: II-XII grossly intact, normal gait, peripheral sensation intact PSYCH: AAOx3, appropriate affect MSK: full range of motion, mild tenderness to palpation over left shoulder and arm, left chest wall. No skin changes over this area. Mild Pain with rotation of the shoulder. No edema, erythema.  Assessment and plan:  Left arm pain Suspect complex regional pain syndrome given history of lung biopsy that may have precipitated this ongoing pain that is out of proportion with exam. Pain has bothered patient for >6 months per chart review. - Given complex history including hx of TB in an HIV pt, will order plain film of cervical spine to rule out any pathology in the cervical spine that may cause nerve pain (ie  potts disease) - will start gabapentin 300 BID for pain  - close follow up, asked patient to return in 2 weeks for follow up - can add physical therapy in the future  Headache Seems to be drug withdrawal given use of aleve daily, headache improves within 30 mins. No signs of migraine or cluster headache, no signs of meningitis (given history) at this time. No red flags. - asked patient to titrate down aleve - will continue to monitor   Orders Placed This Encounter  Procedures  . DG Cervical Spine 1 View    Standing Status:   Future    Standing Expiration Date:   12/28/2017    Order Specific Question:   Reason for Exam (SYMPTOM  OR DIAGNOSIS REQUIRED)    Answer:   right arm shooting pain    Order Specific Question:   Is patient pregnant?    Answer:   No    Order Specific Question:   Preferred imaging location?    Answer:   [facility]    Meds ordered this encounter  Medications  . gabapentin (NEURONTIN) 300 MG capsule    Sig: Take 1 capsule (300 mg total) by mouth 2 (two) times daily.    Dispense:  60 capsule    Refill:  0     Everrett Coombe, MD,MS,  PGY1 10/28/2016 6:16 PM

## 2016-10-28 ENCOUNTER — Ambulatory Visit (INDEPENDENT_AMBULATORY_CARE_PROVIDER_SITE_OTHER): Payer: Medicaid Other | Admitting: Student in an Organized Health Care Education/Training Program

## 2016-10-28 ENCOUNTER — Encounter: Payer: Self-pay | Admitting: Student in an Organized Health Care Education/Training Program

## 2016-10-28 VITALS — BP 104/62 | HR 74 | Ht 65.0 in | Wt 160.0 lb

## 2016-10-28 DIAGNOSIS — M79602 Pain in left arm: Secondary | ICD-10-CM | POA: Diagnosis present

## 2016-10-28 DIAGNOSIS — G4440 Drug-induced headache, not elsewhere classified, not intractable: Secondary | ICD-10-CM

## 2016-10-28 DIAGNOSIS — R519 Headache, unspecified: Secondary | ICD-10-CM | POA: Insufficient documentation

## 2016-10-28 DIAGNOSIS — R51 Headache: Secondary | ICD-10-CM

## 2016-10-28 MED ORDER — GABAPENTIN 300 MG PO CAPS
300.0000 mg | ORAL_CAPSULE | Freq: Two times a day (BID) | ORAL | 0 refills | Status: DC
Start: 2016-10-28 — End: 2017-01-07

## 2016-10-28 NOTE — Patient Instructions (Addendum)
It was a pleasure seeing you today in our clinic. Today we discussed headache and arm pain. Here is the treatment plan we have discussed and agreed upon together: -Gabapentin prescription sent for pain - Please obtain X ray of the spine as ordered Please follow up in 2 weeks. Our clinic's number is [phone]. Please call with questions or concerns about what we discussed today.  Be well, Dr. Burr Medico

## 2016-10-28 NOTE — Assessment & Plan Note (Signed)
Seems to be drug withdrawal given use of aleve daily, headache improves within 30 mins. No signs of migraine or cluster headache, no signs of meningitis (given history) at this time. No red flags. - asked patient to titrate down aleve - will continue to monitor

## 2016-10-28 NOTE — Assessment & Plan Note (Signed)
Suspect complex regional pain syndrome given history of lung biopsy that may have precipitated this ongoing pain that is out of proportion with exam. Pain has bothered patient for >6 months per chart review. - Given complex history including hx of TB in an HIV pt, will order plain film of cervical spine to rule out any pathology in the cervical spine that may cause nerve pain (ie potts disease) - will start gabapentin 300 BID for pain  - close follow up, asked patient to return in 2 weeks for follow up - can add physical therapy in the future

## 2016-11-11 ENCOUNTER — Ambulatory Visit (INDEPENDENT_AMBULATORY_CARE_PROVIDER_SITE_OTHER): Payer: Medicaid Other | Admitting: Physical Medicine and Rehabilitation

## 2016-11-11 NOTE — Progress Notes (Signed)
CC: Headache  HPI: Kelsey Rodriguez is a 25 y.o. female  With PMH HIV, TB, AUB, and previous meningitis (s/p admission 09/2016), who presents to [facility] today with headache of 4-5 months duration. The headache is in the front and back of her head, is present at some point every single day, improves within 45 minutes of taking aleve.  Previously seen for same problem in our clinic and at that time thought to be a drug withdrawal headache. - Denies nuchal rigidity - Denies recent fevers - headache worse with bright lights - Denies any focal weakness - denies history of head injury  Review of Symptoms:  See HPI for ROS.   CC, SH/smoking status, and VS noted.  Objective: BP 120/88   Pulse 68   Temp 98.3 F (36.8 C) (Oral)   Ht 5\' 5"  (1.651 m)   Wt 157 lb (71.2 kg)   LMP 09/21/2016 (Within Days)   BMI 26.13 kg/m  GEN: NAD, alert, cooperative, and pleasant. EYE: no conjunctival injection, pupils equally round and reactive to light ENMT: normal tympanic light reflex, no nasal polyps,no rhinorrhea, no pharyngeal erythema or exudates NECK: full ROM, no thyromegaly, no nuchal rigidity RESPIRATORY: clear to auscultation bilaterally with no wheezes, rhonchi or rales, good effort CV: RRR, no m/r/g, no peripheral edema SKIN: warm and dry, no rashes or lesions NEURO: CN II-XII Grossly intact, normal gait, sensation intact, no dysmetria PSYCH: AAOx3, appropriate affect  Assessment and plan:  Headache No red flags on exam or HPI, however past medical history significant for TB, HIV, also hospitalization for mengingitis.  Must rule out intracranial process such as abscess.   - CD4 level 24 last check 08/2016 - patient endorses follow up with ID specialist Dr. Baxter Flattery scheduled - Ordered MRI brain w/wo contrast - Referral to neurology - return precautions discussed   Orders Placed This Encounter  Procedures  . MR Brain W Wo Contrast    EPIC ORDER WT. 151/ HT. 5'5/ NO CLAUS/ NO METAL IN EYES/  BILATERAL HIP PLACEMENTS/  NO BRAIN SX/ NO EYES OR EARS SX/ NO BRAIN OR HEART SX/ NO DIABETES./ NO KIDNEY OR LIVER DA/ NO HTN/ NO ALLERGIES TO MRI CONTRAST/ NO LUPUS, RA, SCLERODERMA/ NO BLOOD THINNERS OR NSAID/ NO BULLET OR SHRAPNEL/ NO BODY PIERCING OR TATTOO/ NO NEEDS INS- MEDICAID PDA/PATIENT     Standing Status:   Future    Standing Expiration Date:   01/12/2018    Order Specific Question:   If indicated for the ordered procedure, I authorize the administration of contrast media per Radiology protocol    Answer:   Yes    Order Specific Question:   Reason for Exam (SYMPTOM  OR DIAGNOSIS REQUIRED)    Answer:   Headache    Comments:   4-5 months, HIV positive    Order Specific Question:   Preferred imaging location?    Answer:   [facility] (table limit-550lbs)    Order Specific Question:   What is the patient's sedation requirement?    Answer:   No Sedation    Order Specific Question:   Does the patient have a pacemaker or implanted devices?    Answer:   No  . Ambulatory referral to Neurology    Referral Priority:   Routine    Referral Type:   Consultation    Referral Reason:   Specialty Services Required    Requested Specialty:   Neurology    Number of Visits Requested:   1  No orders of the defined types were placed in this encounter.    Everrett Coombe, MD,MS,  PGY1 11/13/2016 6:13 PM

## 2016-11-12 ENCOUNTER — Encounter: Payer: Self-pay | Admitting: Student in an Organized Health Care Education/Training Program

## 2016-11-12 ENCOUNTER — Ambulatory Visit (INDEPENDENT_AMBULATORY_CARE_PROVIDER_SITE_OTHER): Payer: Medicaid Other | Admitting: Student in an Organized Health Care Education/Training Program

## 2016-11-12 VITALS — BP 120/88 | HR 68 | Temp 98.3°F | Ht 65.0 in | Wt 157.0 lb

## 2016-11-12 DIAGNOSIS — R51 Headache: Secondary | ICD-10-CM | POA: Diagnosis present

## 2016-11-12 DIAGNOSIS — R519 Headache, unspecified: Secondary | ICD-10-CM

## 2016-11-12 NOTE — Patient Instructions (Addendum)
It was a pleasure seeing you today in our clinic. Today we discussed your headaches. Here is the treatment plan we have discussed and agreed upon together:  - I put in a referral to neurology. They will call you to schedule an appointment. If you do not get a call within two weeks please give [place] a call. - We scheduled an MRI of your brain in the office today.  Our clinic's number is [phone]. Please call with questions or concerns about what we discussed today.  Be well, Dr. Burr Medico

## 2016-11-13 NOTE — Assessment & Plan Note (Addendum)
No red flags on exam or HPI, however past medical history significant for TB, HIV, also hospitalization for mengingitis.  Must rule out intracranial process such as abscess.   - CD4 level 24 last check 08/2016 - patient endorses follow up with ID specialist Dr. Baxter Flattery scheduled - Ordered MRI brain w/wo contrast - Referral to neurology - return precautions discussed

## 2016-11-19 ENCOUNTER — Other Ambulatory Visit: Payer: Medicaid Other

## 2016-11-20 ENCOUNTER — Ambulatory Visit (INDEPENDENT_AMBULATORY_CARE_PROVIDER_SITE_OTHER): Payer: Medicaid Other | Admitting: Physician Assistant

## 2016-11-21 ENCOUNTER — Ambulatory Visit
Admission: RE | Admit: 2016-11-21 | Discharge: 2016-11-21 | Disposition: A | Discharge status: Home / Self Care | Payer: Medicaid Other | Source: Ambulatory Visit | Attending: Family Medicine | Admitting: Family Medicine

## 2016-11-21 DIAGNOSIS — G8929 Other chronic pain: Secondary | ICD-10-CM

## 2016-11-21 DIAGNOSIS — R519 Headache, unspecified: Secondary | ICD-10-CM

## 2016-11-21 DIAGNOSIS — R51 Headache: Principal | ICD-10-CM

## 2016-11-21 MED ORDER — GADOBENATE DIMEGLUMINE 529 MG/ML IV SOLN
15.0000 mL | Freq: Once | INTRAVENOUS | Status: AC | PRN
  Administered 2016-11-21: 12 mL via INTRAVENOUS

## 2016-11-24 ENCOUNTER — Encounter: Payer: Self-pay | Admitting: Student in an Organized Health Care Education/Training Program

## 2016-11-24 ENCOUNTER — Telehealth: Payer: Self-pay | Admitting: Student in an Organized Health Care Education/Training Program

## 2016-11-24 ENCOUNTER — Telehealth: Payer: Self-pay | Admitting: *Deleted

## 2016-11-24 NOTE — Telephone Encounter (Signed)
Please call pt will MRI results. Thanks! ep

## 2016-11-24 NOTE — Telephone Encounter (Signed)
Received call from [facility] with [facility] Radiology [phone]) and call report given on patient's most recent MRI Head W/WO contrast.  Provider paged at 9:25am to contact me or check EPIC for results.  Inmpression: Bilateral cerebral white matter disease, new from 2014, asymetric appearance. Possible early PML over HIV encephalitis and non infectious demyelination also considered.  Fax is being sent over to be placed in provider's box for review and also results are in [facility] for patient/provider to see.  Will forward to MD to make aware.  Patient also sent an email checking on these results. Jaramiah Bossard,CMA

## 2016-11-24 NOTE — Telephone Encounter (Signed)
Called and left a message for the patient to call back for her MRI results.  I spoke with neuro doctor on call Dr. Leonel Ramsay and also ID doctor on call Dr. Linus Salmons regarding her MRI results, which showed asymmetric white matter changes. Dr. Leonel Ramsay looked at the MRI and indicated that these are nonspecific changes, non-urgent, that can be followed up outpatient.  Dr. Linus Salmons agreed that these are nonspecific changes given that the patient's CD4 counts have been controlled, they may be residual changes from the past that are just showing up now.   Both specialists agreed that the MRI results are not urgent and that the patient can follow up at her scheduled appointments with ID and neurology.

## 2016-11-24 NOTE — Telephone Encounter (Signed)
Spoke with patient and relayed MRI results and results of conversations with ID and Neuro.

## 2016-12-03 ENCOUNTER — Ambulatory Visit: Payer: Medicaid Other | Admitting: Internal Medicine

## 2016-12-11 ENCOUNTER — Ambulatory Visit: Payer: Medicaid Other | Admitting: Neurology

## 2016-12-11 ENCOUNTER — Telehealth: Payer: Self-pay

## 2016-12-11 NOTE — Telephone Encounter (Signed)
Pt did not show for their appt with Dr. Athar today.  

## 2016-12-12 ENCOUNTER — Encounter: Payer: Self-pay | Admitting: Neurology

## 2016-12-17 ENCOUNTER — Telehealth: Payer: Self-pay | Admitting: Student in an Organized Health Care Education/Training Program

## 2016-12-17 NOTE — Telephone Encounter (Signed)
Pt needs a note for school/financial aid stating she was in the hospital in 2017 and that she was discharged she had to be isolated at home for awhile. Pt does not know when this. Financial aid has to have note by tomorrow. ep

## 2016-12-18 ENCOUNTER — Encounter: Payer: Self-pay | Admitting: Student in an Organized Health Care Education/Training Program

## 2016-12-18 NOTE — Telephone Encounter (Signed)
Spoke with patient she needed it right away, so asked if I could print and place at front desk, I obliged. Told patient she could pick it up anytime prior to close of business. Nat Christen, CMA

## 2016-12-18 NOTE — Telephone Encounter (Signed)
Please call to inform the patient I sent a school excuse letter for the dates of her hospitalizations in 2017 (10/10-10-12 and 4/21-4/24).  I sent this to the patient through epic and also routed one through our front office so that it will go to her through the mail.   Thank you,  Everrett Coombe MD

## 2016-12-18 NOTE — Telephone Encounter (Signed)
2nd request. Pt needs this note today if possible. ep

## 2016-12-25 ENCOUNTER — Other Ambulatory Visit: Payer: Medicaid Other

## 2016-12-25 DIAGNOSIS — Z79899 Other long term (current) drug therapy: Secondary | ICD-10-CM

## 2016-12-25 DIAGNOSIS — B20 Human immunodeficiency virus [HIV] disease: Secondary | ICD-10-CM

## 2016-12-25 LAB — CBC WITH DIFFERENTIAL/PLATELET
Basophils Absolute: 63 cells/uL (ref 0–200)
Basophils Relative: 1 %
Eosinophils Absolute: 189 cells/uL (ref 15–500)
Eosinophils Relative: 3 %
HCT: 35.2 % (ref 35.0–45.0)
Hemoglobin: 11.5 g/dL — ABNORMAL LOW (ref 11.7–15.5)
Lymphocytes Relative: 57 %
Lymphs Abs: 3591 cells/uL (ref 850–3900)
MCH: 29.5 pg (ref 27.0–33.0)
MCHC: 32.7 g/dL (ref 32.0–36.0)
MCV: 90.3 fL (ref 80.0–100.0)
MPV: 10.9 fL (ref 7.5–12.5)
Monocytes Absolute: 630 cells/uL (ref 200–950)
Monocytes Relative: 10 %
Neutro Abs: 1827 cells/uL (ref 1500–7800)
Neutrophils Relative %: 29 %
Platelets: 132 10*3/uL — ABNORMAL LOW (ref 140–400)
RBC: 3.9 MIL/uL (ref 3.80–5.10)
RDW: 17.3 % — ABNORMAL HIGH (ref 11.0–15.0)
WBC: 6.3 10*3/uL (ref 3.8–10.8)

## 2016-12-26 LAB — COMPREHENSIVE METABOLIC PANEL
ALT: 21 U/L (ref 6–29)
AST: 24 U/L (ref 10–30)
Albumin: 4.2 g/dL (ref 3.6–5.1)
Alkaline Phosphatase: 70 U/L (ref 33–115)
BUN: 8 mg/dL (ref 7–25)
CO2: 27 mmol/L (ref 20–31)
Calcium: 9.5 mg/dL (ref 8.6–10.2)
Chloride: 105 mmol/L (ref 98–110)
Creat: 0.77 mg/dL (ref 0.50–1.10)
Glucose, Bld: 79 mg/dL (ref 65–99)
Potassium: 4.1 mmol/L (ref 3.5–5.3)
Sodium: 139 mmol/L (ref 135–146)
Total Bilirubin: 0.3 mg/dL (ref 0.2–1.2)
Total Protein: 7.8 g/dL (ref 6.1–8.1)

## 2016-12-26 LAB — LIPID PANEL
Cholesterol: 215 mg/dL — ABNORMAL HIGH (ref <200)
HDL: 67 mg/dL (ref >50)
LDL Cholesterol: 119 mg/dL — ABNORMAL HIGH (ref <100)
Total CHOL/HDL Ratio: 3.2 Ratio (ref <5.0)
Triglycerides: 144 mg/dL (ref <150)
VLDL: 29 mg/dL (ref <30)

## 2016-12-26 LAB — T-HELPER CELL (CD4) - (RCID CLINIC ONLY)
CD4 % Helper T Cell: 16 % — ABNORMAL LOW (ref 33–55)
CD4 T Cell Abs: 550 /uL (ref 400–2700)

## 2016-12-29 LAB — HIV-1 RNA QUANT-NO REFLEX-BLD
HIV 1 RNA Quant: 36580 copies/mL — ABNORMAL HIGH (ref <20)
HIV-1 RNA Quant, Log: 4.56 Log copies/mL — ABNORMAL HIGH (ref <1.30)

## 2017-01-01 ENCOUNTER — Encounter: Payer: Self-pay | Admitting: Internal Medicine

## 2017-01-07 ENCOUNTER — Encounter: Payer: Self-pay | Admitting: Neurology

## 2017-01-07 ENCOUNTER — Ambulatory Visit (INDEPENDENT_AMBULATORY_CARE_PROVIDER_SITE_OTHER): Payer: Medicaid Other | Admitting: Neurology

## 2017-01-07 VITALS — BP 114/62 | HR 76 | Resp 16 | Ht 65.0 in | Wt 151.0 lb

## 2017-01-07 DIAGNOSIS — G478 Other sleep disorders: Secondary | ICD-10-CM | POA: Diagnosis not present

## 2017-01-07 DIAGNOSIS — R400 Somnolence: Secondary | ICD-10-CM

## 2017-01-07 DIAGNOSIS — B20 Human immunodeficiency virus [HIV] disease: Secondary | ICD-10-CM

## 2017-01-07 DIAGNOSIS — R351 Nocturia: Secondary | ICD-10-CM | POA: Diagnosis not present

## 2017-01-07 DIAGNOSIS — R51 Headache: Secondary | ICD-10-CM | POA: Diagnosis not present

## 2017-01-07 DIAGNOSIS — R9089 Other abnormal findings on diagnostic imaging of central nervous system: Secondary | ICD-10-CM | POA: Diagnosis not present

## 2017-01-07 DIAGNOSIS — R519 Headache, unspecified: Secondary | ICD-10-CM

## 2017-01-07 MED ORDER — AMITRIPTYLINE HCL 25 MG PO TABS: ORAL_TABLET | ORAL | 5 refills | Status: DC

## 2017-01-07 NOTE — Patient Instructions (Signed)
Please remember, common headache triggers are: sleep deprivation, dehydration, overheating, stress, hypoglycemia or skipping meals and blood sugar fluctuations, excessive pain medications or excessive alcohol use or caffeine withdrawal. Some people have food triggers such as aged cheese, orange juice or chocolate, especially dark chocolate, or MSG (monosodium glutamate). Try to avoid these headache triggers as much possible. It may be helpful to keep a headache diary to figure out what makes your headaches worse or brings them on and what alleviates them. Some people report headache onset after exercise but studies have shown that regular exercise may actually prevent headaches from coming. If you have exercise-induced headaches, please make sure that you drink plenty of fluid before and after exercising and that you do not over do it and do not overheat.  For migraine prevention, I would like to suggest: Elavil (generic name: amitriptyline) 25 mg: Take half a pill daily at bedtime for one week, then one pill daily at bedtime for one week, then one and a half pills daily at bedtime for one week, then 2 pills daily at bedtime thereafter. Common side effects reported are: mouth dryness, drowsiness, confusion, dizziness.   Based on your symptoms and your exam I believe you are at risk for obstructive sleep apnea or OSA, and I think we should proceed with a sleep study to determine whether you do or do not have OSA and how severe it is. If you have more than mild OSA, I want you to consider treatment with CPAP. Please remember, the risks and ramifications of moderate to severe obstructive sleep apnea or OSA are: Cardiovascular disease, including congestive heart failure, stroke, difficult to control hypertension, arrhythmias, and even type 2 diabetes has been linked to untreated OSA. Sleep apnea causes disruption of sleep and sleep deprivation in most cases, which, in turn, can cause recurrent headaches, problems  with memory, mood, concentration, focus, and vigilance. Most people with untreated sleep apnea report excessive daytime sleepiness, which can affect their ability to drive. Please do not drive if you feel sleepy.   I will likely see you back after your sleep study to go over the test results and where to go from there. We will call you after your sleep study to advise about the results (most likely, you will hear from Beverlee Nims, my nurse) and to set up an appointment at the time, as necessary.    Our sleep lab administrative assistant, Arrie Aran will meet with you or call you to schedule your sleep study. If you don't hear back from her by next week please feel free to call her at [phone]. This is her direct line and please leave a message with your phone number to call back if you get the voicemail box. She will call back as soon as possible.

## 2017-01-07 NOTE — Progress Notes (Signed)
Subjective:    Patient ID: Kelsey Rodriguez is a 26 y.o. female.  HPI     Star Age, MD, PhD [facility] [address] [place], [place] [address]  Dear Drs. Burr Medico and Ardelia Mems,   I saw your patient, Kelsey Rodriguez, upon your kind request in my neurologic clinic today for initial consultation of her recurrent headaches. The patient is unaccompanied today. Of note, the patient no showed for an appointment on 12/11/2016. As you know, [name] is a very pleasant 26 year old right-handed woman with an underlying complex medical history of HIV, TB, aseptic meningitis for which she was in the hospital in October 2017, adrenal insufficiency, bilateral necrosis of the hip, s/p b/l TKAs, and abnormal brain MRI recently, who reports recurrent headaches, sometimes throbbing, sometimes tightness. Has been going for a few months, slightly better in the past month or so. Had nearly daily headaches for a while, less frequent at this time. Tried gabapentin 300 mg bid, but did not help. She has occasional blurry vision with her headache, no family history of migraines, no personal history of migraines. She does not sleep very well and is tired during the day, does not typically nap. She has not worked in years but is going to start a part-time job as I understand. She has been seen by an optometrist. She is a nonsmoker, does not use illicit drugs, drinks alcohol socially, does not utilize any daily caffeine. Aleve has been helpful for her headaches, sometimes Tylenol helps. Headache location is bifrontal or all over, sometimes starts in the front and radiates to the back, typically not one-sided, typically no nausea or vomiting. She has a history of Bell's palsy on the left side over 5 years ago, she says she was in [place] when she first had this. Since then she had some recurrence of Bell's palsy type symptoms but denies any other one-sided weakness or numbness or tingling. Does not  know if she snores, does not sleep well, has nocturia 1-2 times per night. wakes up with HA at times. Has LBP and was supposed to have ESI, but opted out. She has woken herself up with gasping.  She had a brain MRI with and without contrast on 11/21/2016: IMPRESSION: Bilateral cerebral white matter disease that is new from 2014. Asymmetric appearance and lack of atrophy would favor early PML over HIV encephalitis. In this young female, noninfectious demyelination is also considered.  Her Past Medical History Is Significant For: Past Medical History:  Diagnosis Date  . 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 Adrenal insufficiency ([place])   . Anemia of chronic disease 03/11/2012  . Bell's palsy 08/26/2013  . 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 Chronic back pain   . Chronic leg pain    bilateral knees, ankles  . Fatigue   . 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 HIV (human immunodeficiency virus infection) ([place]) 02/2012  . Laceration of ankle, right 11/18/2012  . Lumbar radiculopathy   . Pelvic pain   . Reflux esophagitis 03/11/2012  . Tuberculosis   . 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 Vertigo     Her Past Surgical History Is Significant For: Past Surgical History:  Procedure Laterality Date  . APPENDECTOMY  ~ 2000  . DILATION AND CURETTAGE OF UTERUS  2008  . ESOPHAGOGASTRODUODENOSCOPY  03/11/2012   Procedure: ESOPHAGOGASTRODUODENOSCOPY (EGD);  Surgeon: Lafayette Dragon, MD;  Location: [facility] ENDOSCOPY;  Service: Endoscopy;  Laterality: N/A;  . ESOPHAGOGASTRODUODENOSCOPY N/A 03/07/2014  Procedure: ESOPHAGOGASTRODUODENOSCOPY (EGD);  Surgeon: Gatha Mayer, MD;  Location: [facility] ENDOSCOPY;  Service: Endoscopy;  Laterality: N/A;  . LUNG BIOPSY  02/2012  . TOTAL HIP ARTHROPLASTY Left 12/14/2015   Procedure: LEFT TOTAL HIP ARTHROPLASTY ANTERIOR APPROACH;  Surgeon: Mcarthur Rossetti, MD;  Location: [facility] ORS;  Service: Orthopedics;  Laterality: Left;  . TOTAL HIP ARTHROPLASTY Right 04/04/2016    Procedure: RIGHT TOTAL HIP ARTHROPLASTY ANTERIOR APPROACH;  Surgeon: Mcarthur Rossetti, MD;  Location: [facility] ORS;  Service: Orthopedics;  Laterality: Right;    Her Family History Is Significant For: Family History  Problem Relation Age of Onset  . Heart disease Father     Vague not clearly cardiac    Her Social History Is Significant For: Social History   Social History  . Marital status: Single    Spouse name: N/A  . Number of children: 0  . Years of education: college   Social History Main Topics  . Smoking status: Never Smoker  . Smokeless tobacco: Never Used  . Alcohol use 0.0 oz/week     Comment: socially  . Drug use: No  . Sexual activity: Not Currently    Partners: Male    Birth control/ protection: None     Comment: pt. given condoms   Other Topics Concern  . 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     Drinks about 1 soda a day     Her Allergies Are:  Allergies  Allergen Reactions  . Hydrocodone Itching and Nausea Only    Tolerates Oxycodone  . Tramadol Itching and Nausea Only    Tolerates oxycodone  :   Her Current Medications Are:  Outpatient Encounter Prescriptions as of 01/07/2017  Medication Sig  . Darunavir Ethanolate (PREZISTA) 800 MG tablet Take 1 tablet (800 mg total) by mouth daily with breakfast.  . elvitegravir-cobicistat-emtricitabine-tenofovir (GENVOYA) 150-150-200-10 MG TABS tablet Take 1 tablet by mouth daily with breakfast.  . meclizine (ANTIVERT) 32 MG tablet Take 1 tablet (32 mg total) by mouth 3 (three) times daily as needed. If needed for "room spinning" dizziness  . [DISCONTINUED] acetaminophen (TYLENOL) 325 MG tablet Take 2 tablets (650 mg total) by mouth every 6 (six) hours as needed.  . [DISCONTINUED] estradiol (ESTRACE) 2 MG tablet Take 1 tablet (2 mg total) by mouth daily. X 7 days only for vaginal bleeding (Patient not taking: Reported on 10/23/2016)  . [DISCONTINUED] gabapentin (NEURONTIN) 300  MG capsule Take 1 capsule (300 mg total) by mouth 2 (two) times daily.  . [DISCONTINUED] lidocaine (LIDODERM) 5 % Place 1 patch onto the skin daily. Remove & Discard patch within 12 hours or as directed by MD  . [DISCONTINUED] ondansetron (ZOFRAN ODT) 4 MG disintegrating tablet Take 1 tablet (4 mg total) by mouth every 8 (eight) hours as needed for nausea or vomiting. (Patient not taking: Reported on 10/23/2016)   No facility-administered encounter medications on file as of 01/07/2017.   :   Review of Systems:  Out of a complete 14 point review of systems, all are reviewed and negative with the exception of these symptoms as listed below:  Review of Systems  Neurological:       Patient states that she has had a constant headache for the past couple of month. Reports having all over back pain that started the same time her headache did.     Objective:  Neurologic Exam  Physical Exam Physical Examination:  Vitals:   01/07/17 1447  BP: 114/62  Pulse: 76  Resp: 16    General Examination: The patient is a very pleasant 26 y.o. female in no acute distress. She appears well-developed and well-nourished and well groomed.   HEENT: Normocephalic, atraumatic, pupils are equal, round and reactive to light and accommodation. Perhaps slightly effaced left nasolabial fold. Funduscopic exam is normal with sharp disc margins noted. Extraocular tracking is good without limitation to gaze excursion or nystagmus noted. Normal smooth pursuit is noted. Hearing is grossly intact. Face is otherwise symmetric with normal facial animation and normal facial sensation. Speech is clear with no dysarthria noted. There is no hypophonia. There is no lip, neck/head, jaw or voice tremor. Neck is supple with full range of passive and active motion. There are no carotid bruits on auscultation. Oropharynx exam reveals: mild mouth dryness, good dental hygiene and moderate airway crowding, due to Smaller airway entry,  tonsils in place but were not fully visualized, larger tongue. Mallampati is class II. Tongue protrudes centrally and palate elevates symmetrically. Neck size is 13 3/8 inches. She has a Moderate overbite.  Chest: Clear to auscultation without wheezing, rhonchi or crackles noted.  Heart: S1+S2+0, regular and normal without murmurs, rubs or gallops noted.   Abdomen: Soft, non-tender and non-distended with normal bowel sounds appreciated on auscultation.  Extremities: There is no pitting edema in the distal lower extremities bilaterally, but has puffy ankles. Pedal pulses are intact.  Skin: Warm and dry without trophic changes noted.   Musculoskeletal: exam reveals no obvious joint deformities, tenderness or joint swelling or erythema, with the exception of decreased range of motion in both hips.   Neurologically:  Mental status: The patient is awake, alert and oriented in all 4 spheres. Her immediate and remote memory, attention, language skills and fund of knowledge are appropriate. There is no evidence of aphasia, agnosia, apraxia or anomia. Speech is clear with normal prosody and enunciation. Thought process is linear. Mood is normal and affect is normal.  Cranial nerves II - XII are as described above under HEENT exam. In addition: shoulder shrug is normal with equal shoulder height noted. Motor exam: Normal bulk, strength and tone is noted. There is no drift, tremor or rebound. Romberg is negative. Reflexes are 2+ throughout. Babinski: Toes are flexor bilaterally. Fine motor skills and coordination: intact with normal finger taps, normal hand movements, normal rapid alternating patting, normal foot taps and normal foot agility.  Cerebellar testing: No dysmetria or intention tremor on finger to nose testing. Heel to shin is mildly difficult because of decreased range of motion in both hips. There is no truncal or gait ataxia.  Sensory exam: intact to light touch, pinprick, vibration,  temperature sense in the upper and lower extremities.  Gait, station and balance: She stands incompletely. No veering to one side is noted. No leaning to one side is noted. Posture is age-appropriate and stance is narrow based. Gait shows normal stride length and normal pace. No problems turning are noted. Tandem walk is unremarkable.   Assessment and Plan:  In summary, Kelsey Rodriguez is a very pleasant 26 y.o.-year old female with an underlying complex medical history of HIV, TB, aseptic meningitis for which she was in the hospital in October 2017, adrenal insufficiency, bilateral necrosis of the hip, s/p b/l TKAs, and abnormal brain MRI recently, who presents for initial consultation of her recurrent headaches, mostly bilateral, nearly daily at one point but less frequent at this time. Thankfully, she has  a nonfocal exam. She did have an abnormal brain MRI recently and I explained to her that this can be seen in the context of HIV disease. Clinically and by lab criteria as I understand she has been doing well with respect to her HIV. In addition, she has a nonfocal exam. She has tried gabapentin for headaches which did not help her. Her headache description is not classic for migraines. Of note, she also has difficulty with sleep, she says she tried a prescription medication for sleep but it did not help or she did not want to take it. She does wake up with a sense of gasping for air at times and morning headaches and she does have a crowded appearing airway, even though she is not obese and she does not have a large neck circumference, nevertheless, I would like to rule out obstructive sleep apnea and suggested an overnight sleep study. At some point her brain MRI should be repeated as well for comparison. As far as her headaches, we talked about medical treatments and non-pharmacological approaches. We talked about maintaining a healthy lifestyle in general. I encouraged the patient to eat healthy, exercise  daily and keep well hydrated, to keep a scheduled bedtime and wake time routine, to not skip any meals and eat healthy snacks in between meals and to have protein with every meal.   I advised the patient about common headache triggers: sleep deprivation, dehydration, overheating, stress, hypoglycemia or skipping meals and blood sugar fluctuations, excessive pain medications or excessive alcohol use or caffeine withdrawal. Some people have food triggers such as aged cheese, orange juice or chocolate, especially dark chocolate, or MSG (monosodium glutamate). She is to try to avoid these headache triggers as much possible. It may be helpful to keep a headache diary to figure out what makes Her headaches worse or brings them on and what alleviates them. Some people report headache onset after exercise but studies have shown that regular exercise may actually prevent headaches from coming. If She has exercise-induced headaches, She is advised to drink plenty of fluid before and after exercising and that to not overdo it and to not overheat.  As far as further diagnostic testing is concerned, I suggested the following today: PSG.   As far as medications are concerned, I recommended the following at this time: I would like to start a trial of amitriptyline, starting low dose with gradual titration. We will start with 25 mg strength half a pill each night, she is advised about potential side effects and how to titrate this medication and she will increase it in weekly increments to up to 50 mg at night. It may help her sleep a little better as well. We will call her with her sleep study results and also make a follow-up appointment at the time.   I answered all her questions today and the patient was in agreement with the above outlined plan.  Thank you very much for allowing me to participate in the care of this nice patient. If I can be of any further assistance to you please do not hesitate to call me at  [phone].  Sincerely,   Star Age, MD, PhD

## 2017-01-08 ENCOUNTER — Ambulatory Visit: Payer: Medicaid Other | Admitting: Internal Medicine

## 2017-01-09 ENCOUNTER — Encounter: Payer: Self-pay | Admitting: Student in an Organized Health Care Education/Training Program

## 2017-01-16 ENCOUNTER — Telehealth: Payer: Self-pay | Admitting: Neurology

## 2017-01-16 NOTE — Telephone Encounter (Signed)
Pt called said she had to work tonight and needed to r/s sleep study. Msg sent to Robin via text msg that the 9:30 sleep study pt had to c/a. The pt's name was not disclosed in the msg.

## 2017-01-19 ENCOUNTER — Ambulatory Visit (INDEPENDENT_AMBULATORY_CARE_PROVIDER_SITE_OTHER): Payer: Medicaid Other | Admitting: Neurology

## 2017-01-19 DIAGNOSIS — G4710 Hypersomnia, unspecified: Secondary | ICD-10-CM

## 2017-01-19 DIAGNOSIS — G4720 Circadian rhythm sleep disorder, unspecified type: Secondary | ICD-10-CM

## 2017-01-19 DIAGNOSIS — R0683 Snoring: Secondary | ICD-10-CM

## 2017-01-22 ENCOUNTER — Telehealth: Payer: Self-pay

## 2017-01-22 NOTE — Telephone Encounter (Signed)
-----   Message from Star Age, MD sent at 01/22/2017  9:38 AM EST ----- Patient referred by Dr. Burr Medico, Resident, seen by me on 01/07/17, diagnostic PSG on 01/19/17.   Please call and notify the patient that the recent sleep study did not show any significant obstructive sleep apnea. Please inform patient that we can go over the details of the study during her follow up appointment. Arrange a followup appointment in 1-2 months for HAs. Also, route or fax report to PCP and referring MD, if other than PCP.  Once you have spoken to patient, you can close this encounter.   Thanks,  Star Age, MD, PhD [facility])

## 2017-01-22 NOTE — Telephone Encounter (Signed)
I called pt. I advised her that her sleep study did not show any significant sleep apnea. Dr. Rexene Alberts recommends that pt come in for a follow up pt go over the details of the study during her follow up appt in 1-2 months and also for discussion of her headaches. Pt is agreeable to this. Pt is agreeable to an appt on 03/24/2017. Pt verbalized understanding of results. Pt had no questions at this time but was encouraged to call back if questions arise.

## 2017-01-22 NOTE — Procedures (Signed)
PATIENT'S NAME:  Kelsey Rodriguez, Kelsey Rodriguez  DOB:      June 01, 1991      MR#:    [id number]     DATE OF RECORDING: 01/19/2017 REFERRING M.D.:  Everrett Coombe, MD Study Performed:   Baseline Polysomnogram HISTORY: 26 year old woman with a history of HIV, TB, aseptic meningitis for which she was in the hospital in October 2017, adrenal insufficiency, bilateral necrosis of the hip, s/p b/l TKAs, and abnormal brain MRI recently, who reports recurrent headaches, not sleeping well, daytime tiredness. The patient's weight 151 pounds with a height of 65 (inches), resulting in a BMI of 25. kg/m2. The patient's neck circumference measured 13.5 inches.   CURRENT MEDICATIONS: Darunavir Ethanolate, Elvitegravie and Meclizine   PROCEDURE:  This is a multichannel digital polysomnogram utilizing the Somnostar 11.2 system.  Electrodes and sensors were applied and monitored per AASM Specifications.   EEG, EOG, Chin and Limb EMG, were sampled at 200 Hz.  ECG, Snore and Nasal Pressure, Thermal Airflow, Respiratory Effort, CPAP Flow and Pressure, Oximetry was sampled at 50 Hz. Digital video and audio were recorded.      BASELINE STUDY  Lights Out was at 22:35 and Lights On at 05:01.  Total recording time (TRT) was 386 minutes, with a total sleep time (TST) of  370.5 minutes.   The patient's sleep latency was 3.5 minutes.  REM latency was 45.5 minutes, which is mildly reduced.  The sleep efficiency was 96. %.     SLEEP ARCHITECTURE: WASO (Wake after sleep onset) was 15.5 minutes with one bathroom break, but no significant sleep disruption.  There were 10.5 minutes in Stage N1, 279 minutes Stage N2, 0 minutes Stage N3 and 81 minutes in Stage REM.  The percentage of Stage N1 was 2.8%, Stage N2 was 75.3%, which is increased, Stage N3 was absent, and Stage R (REM sleep) was 21.9%, which is normal.   The arousals were noted as: 30 were spontaneous, 0 were associated with PLMs, 7 were associated with respiratory events.    Audio and video  analysis did not show any abnormal or unusual movements, behaviors, phonations or vocalizations.  The patient took 1 bathroom break. Mild intermittent snoring was noted. The EKG was in keeping with normal sinus rhythm (NSR).  RESPIRATORY ANALYSIS:  There were a total of 7 respiratory events:  0 obstructive apneas, 0 central apneas and 0 mixed apneas with a total of 0 apneas and an apnea index (AI) of 0 /hour. There were 7 hypopneas with a hypopnea index of 1.1 /hour. The patient also had 0 respiratory event related arousals (RERAs).      The total APNEA/HYPOPNEA INDEX (AHI) was 1.1/hour and the total RESPIRATORY DISTURBANCE INDEX was 1.1 /hour.  6 events occurred in REM sleep and 2 events in NREM. The REM AHI was 4.4 /hour, versus a non-REM AHI of .2. The patient spent 169 minutes of total sleep time in the supine position and 202 minutes in non-supine.. The supine AHI was 1.4 versus a non-supine AHI of 0.9.  OXYGEN SATURATION & C02:  The Wake baseline 02 saturation was 95%, with the lowest being 88%. Time spent below 89% saturation equaled 0 minutes.  PERIODIC LIMB MOVEMENTS:   The patient had a total of 0 Periodic Limb Movements.  The Periodic Limb Movement (PLM) index was 0 and the PLM Arousal index was 0/hour.  Post-study, the patient indicated that sleep was better than usual.   IMPRESSION:  1. Primary Snoring 2. Dysfunctions associated with sleep stages  or arousal from sleep  RECOMMENDATIONS:  1. This study does not demonstrate any significant obstructive or central sleep disordered breathing; patient had mild intermittent snoring, for which avoiding the supine sleep position and some weight reduction may help. This study does not support an intrinsic sleep disorder as a cause of the patient's symptoms of feeling tired and recurrent headaches. Other causes, including circadian rhythm disturbances, an underlying mood disorder, medication effect and/or an underlying medical problem cannot be  ruled out. 2. This study shows mildly abnormal sleep stage percentages; these are nonspecific findings and per se do not signify an intrinsic sleep disorder or a cause for the patient's sleep-related symptoms. Causes include (but are not limited to) the first night effect of the sleep study, circadian rhythm disturbances, medication effect or an underlying mood disorder or medical problem.  3. The patient should be cautioned not to drive, work at heights, or operate dangerous or heavy equipment when tired or sleepy. Review and reiteration of good sleep hygiene measures should be pursued with any patient. 4. The patient will be seen in follow-up by Dr. Rexene Alberts at [facility] for discussion of the test results and further management strategies. The referring provider will be notified of the test results.   I certify that I have reviewed the entire raw data recording prior to the issuance of this report in accordance with the Standards of Accreditation of the American Academy of Sleep Medicine (AASM)    Star Age, MD, PhD Diplomat, American Board of Psychiatry and Neurology (Neurology and Sleep Medicine)

## 2017-01-22 NOTE — Progress Notes (Signed)
Patient referred by Dr. Burr Medico, Resident, seen by me on 01/07/17, diagnostic PSG on 01/19/17.   Please call and notify the patient that the recent sleep study did not show any significant obstructive sleep apnea. Please inform patient that we can go over the details of the study during her follow up appointment. Arrange a followup appointment in 1-2 months for HAs. Also, route or fax report to PCP and referring MD, if other than PCP.  Once you have spoken to patient, you can close this encounter.   Thanks,  Star Age, MD, PhD [facility])

## 2017-02-03 ENCOUNTER — Encounter: Payer: Self-pay | Admitting: Student in an Organized Health Care Education/Training Program

## 2017-02-03 ENCOUNTER — Ambulatory Visit (INDEPENDENT_AMBULATORY_CARE_PROVIDER_SITE_OTHER): Payer: Medicaid Other | Admitting: Student in an Organized Health Care Education/Training Program

## 2017-02-03 DIAGNOSIS — G90512 Complex regional pain syndrome I of left upper limb: Secondary | ICD-10-CM | POA: Diagnosis present

## 2017-02-03 DIAGNOSIS — IMO0002 Reserved for concepts with insufficient information to code with codable children: Secondary | ICD-10-CM | POA: Insufficient documentation

## 2017-02-03 DIAGNOSIS — K5900 Constipation, unspecified: Secondary | ICD-10-CM

## 2017-02-03 DIAGNOSIS — N926 Irregular menstruation, unspecified: Secondary | ICD-10-CM | POA: Diagnosis not present

## 2017-02-03 HISTORY — DX: Reserved for concepts with insufficient information to code with codable children: IMO0002

## 2017-02-03 MED ORDER — POLYETHYLENE GLYCOL 3350 17 GM/SCOOP PO POWD: 17.0000 g | Freq: Two times a day (BID) | ORAL | 1 refills | Status: DC | PRN

## 2017-02-03 NOTE — Assessment & Plan Note (Signed)
-   Patient reports irregularities since discontinuing Depo-Provera - Sometimes has multiple episodes of bleeding in one month - Suggested the possibility of using an OCP to help regulate cycles, however patient is not interested at this time - She states she will see how this month goes and consider OCPs at her next visit.

## 2017-02-03 NOTE — Assessment & Plan Note (Signed)
-   Patient currently has one hard stool every other day - Recommended starting MiraLAX, and titrate up as needed to have one or 2 soft stools per day - No red flags

## 2017-02-03 NOTE — Progress Notes (Signed)
CC: Abdominal pain, Shoulder pain, AUB  HPI: Kelsey Rodriguez is a 26 y.o. female with PMH significant for HIV, avascular necrosis of both hips, status post hip replacements, and history of tuberculosis, who presents to [facility] today with abdominal pain, shoulder pain, abnormal uterine bleeding.  Abdominal Pain.  Patient reports this pain has been intermittent for many months. - worst during menstrual cycle - crampy, does not radiate - Pain is not worse with eating - Pain is worse with constipation - no hematochezia or melena - last BM day before yesterday - Patient does endorse hard stools every 2 days  Shoulder Pain/ Chest wall Pain Patient was previously seen for this problem, and it was thought to be due to complex regional pain syndrome. The pain is located right over her chest wall where her lung was previously biopsied and there is now a scar that is well-healed.  - worse with exercise, worst with vacuuming - better with OTC pain relief - crampy pain - Pain does not radiate, though it previously radiated through the shoulder now it is just located over the scar - She feels it becomes very tense when it is painful - The only relief is to stop and rest - No pain with deep inspiration, no sweating, palpitations, lightheadedness, or dizziness  Abnormal Uterine Bleeding/menorrhagia - Patient discontinued Depo-Provera 2-3 months ago - She reports abnormal menstrual cycles; she reports bleeding one time in some months and twice in other months. - Patient is not interested in starting oral contraceptives at this time - Patient is not currently sexually active, when she has she uses condoms - No abnormal vaginal discharge, odor, pruritus, or pain - No lightheadedness, palpitations, chest pain, dizziness, weakness, fatigue  Review of Symptoms:  See HPI for ROS.   CC, SH/smoking status, and VS noted.  Objective: BP 106/62   Pulse 86   Temp 98.6 F (37 C) (Oral)   Ht 5\' 5"  (1.651 m)    Wt 155 lb 3.2 oz (70.4 kg)   SpO2 99%   BMI 25.83 kg/m  GEN: NAD, alert, cooperative, and pleasant. RESPIRATORY: clear to auscultation bilaterally with no wheezes, rhonchi or rales, good effort CV: RRR, no m/r/g, no peripheral edema GI: soft, non-tender, non-distended, no masses palpable, no rebound guarding or tenderness SKIN: warm and dry, well-healing incisional scar over right chest wall MSK + mild tenderness to palpation over second right costochondral space in the area of the scar without any palpable masses or abnormalities NEURO: II-XII grossly intact, normal gait, peripheral sensation intact PSYCH: AAOx3, appropriate affect   Assessment and plan:  Complex regional pain syndrome - Pain is located over incisional scar on right chest, previously radiated to shoulder but now stays right over the scar. Pain is reproducible on palpation - She was previously trialed on gabapentin which she believes helps but she is no longer taking - Recommended restarting gabapentin 300 mg twice a day for pain, can titrate up as needed - Reviewed chest x-ray from 09/2016-no abnormalities, no findings on exam to suggest cardiac or lung pathology contributing to pain. Patient had significant cardiac workup with Holter monitor in 2016. Pain is reproducible and most likely musculoskeletal in nature. - Patient states she has gabapentin at home, she does not have it she can call back and I will refill it.  Constipation - Patient currently has one hard stool every other day - Recommended starting MiraLAX, and titrate up as needed to have one or 2 soft stools per day -  No red flags  Irregular menstrual cycle - Patient reports irregularities since discontinuing Depo-Provera - Sometimes has multiple episodes of bleeding in one month - Suggested the possibility of using an OCP to help regulate cycles, however patient is not interested at this time - She states she will see how this month goes and consider  OCPs at her next visit.   Meds ordered this encounter  Medications  . polyethylene glycol powder (GLYCOLAX/MIRALAX) powder    Sig: Take 17 g by mouth 2 (two) times daily as needed.    Dispense:  119 g    Refill:  1    Everrett Coombe, MD,MS,  PGY1 02/03/2017 4:39 PM

## 2017-02-03 NOTE — Patient Instructions (Addendum)
It was a pleasure seeing you today in our clinic. Today we discussed constipation, shoulder pain, and uterine bleeding. Here is the treatment plan we have discussed and agreed upon together:  - For your constipation, I sent MiraLAX to your pharmacy. This medication is also available over-the-counter. You can start by putting a capful in a beverage once per day and gradually titrate up this medication as you need to have 1-2 soft bowel movements per day.   - For your shoulder pain, I believe this is due to something called complex regional pain syndrome which can happen in patients after they have a biopsy or surgery, when they have pain that feels worse then exam findings might suggest. Your pain is right near your scar from your lung biopsy.  I recommend trying the gabapentin medication we prescribed last time to see if this helps with your pain. If you need more of this medication, please call back the office, I'm happy to refill it.  - For your uterine bleeding, we can think about starting an oral contraceptive medication to help regulate your cycles. I know you wanted more time to consider this, we can talk about it and your next visit if you would like.  Our clinic's number is [phone]. Please call with questions or concerns about what we discussed today.  Be well, Dr. Burr Medico

## 2017-02-03 NOTE — Assessment & Plan Note (Addendum)
-   Pain is located over incisional scar on right chest, previously radiated to shoulder but now stays right over the scar. Pain is reproducible on palpation - She was previously trialed on gabapentin which she believes helps but she is no longer taking - Recommended restarting gabapentin 300 mg twice a day for pain, can titrate up as needed - Reviewed chest x-ray from 09/2016-no abnormalities, no findings on exam to suggest cardiac or lung pathology contributing to pain. Patient had significant cardiac workup with Holter monitor in 2016. Pain is reproducible and most likely musculoskeletal in nature. - Patient states she has gabapentin at home, she does not have it she can call back and I will refill it.

## 2017-02-12 ENCOUNTER — Encounter: Payer: Self-pay | Admitting: Internal Medicine

## 2017-02-12 ENCOUNTER — Ambulatory Visit (INDEPENDENT_AMBULATORY_CARE_PROVIDER_SITE_OTHER): Payer: Medicaid Other | Admitting: Internal Medicine

## 2017-02-12 ENCOUNTER — Other Ambulatory Visit: Payer: Self-pay | Admitting: Internal Medicine

## 2017-02-12 VITALS — BP 133/82 | HR 91 | Temp 98.0°F | Wt 152.0 lb

## 2017-02-12 DIAGNOSIS — Z9119 Patient's noncompliance with other medical treatment and regimen: Secondary | ICD-10-CM

## 2017-02-12 DIAGNOSIS — R9082 White matter disease, unspecified: Secondary | ICD-10-CM

## 2017-02-12 DIAGNOSIS — Z91199 Patient's noncompliance with other medical treatment and regimen due to unspecified reason: Secondary | ICD-10-CM

## 2017-02-12 DIAGNOSIS — R930 Abnormal findings on diagnostic imaging of skull and head, not elsewhere classified: Secondary | ICD-10-CM

## 2017-02-12 DIAGNOSIS — B20 Human immunodeficiency virus [HIV] disease: Secondary | ICD-10-CM | POA: Diagnosis not present

## 2017-02-12 NOTE — Progress Notes (Signed)
RFV: hiv disease  Patient ID: Kelsey Rodriguez, female   DOB: 1991/10/22, 26 y.o.   MRN: [id number]  HPI 26yo F with HIV disease, CD 4 count 550/ 36,500 VL on genvoya-DRV. Has been doing well except she had headache where her PCP ordered Brain MRI. Interestingly, her MRI showed new patchy white matter disease compared to 2014. 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 Or noninfectious demyelination  Her headaches improved without any intervention. No LP. Interestingly, her VL is detectable, but she states that she has not missed a dose  Outpatient Encounter Prescriptions as of 02/12/2017  Medication Sig  . amitriptyline (ELAVIL) 25 MG tablet 1/2 pill each bedtime x 1 week, then 1 pill nightly x 1 week, then 1 1/2 pills nightly x 1 week, then 2 pills nightly thereafter.  . Darunavir Ethanolate (PREZISTA) 800 MG tablet Take 1 tablet (800 mg total) by mouth daily with breakfast.  . elvitegravir-cobicistat-emtricitabine-tenofovir (GENVOYA) 150-150-200-10 MG TABS tablet Take 1 tablet by mouth daily with breakfast.  . meclizine (ANTIVERT) 32 MG tablet Take 1 tablet (32 mg total) by mouth 3 (three) times daily as needed. If needed for "room spinning" dizziness  . polyethylene glycol powder (GLYCOLAX/MIRALAX) powder Take 17 g by mouth 2 (two) times daily as needed.   No facility-administered encounter medications on file as of 02/12/2017.      Patient Active Problem List   Diagnosis Date Noted  . Complex regional pain syndrome 02/03/2017  . Constipation 02/03/2017  . Irregular menstrual cycle 02/03/2017  . 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 Avascular necrosis of bone of right hip ([place]) 04/04/2016  . 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 Avascular necrosis of bone of left hip ([place]) 12/14/2015  . 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 Overweight (BMI 25.0-29.9) 04/20/2015  . Seasonal allergies 03/20/2015  . Menorrhagia 03/19/2015  . Back pain of lumbar region with  sciatica 02/12/2015  . Primary adrenal insufficiency ([place]) 01/03/2015  . Laceration of ankle, right 11/18/2012  . 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 Arthralgia 05/20/2012  . HIV (human immunodeficiency virus infection) ([place]) 03/16/2012  . 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 Iron deficiency anemia 03/11/2012  . Reflux esophagitis 03/11/2012     There are no preventive care reminders to display for this patient.   Review of Systems  Constitutional: Negative for fever, chills, diaphoresis, activity change, appetite change, fatigue and unexpected weight change.  HENT: Negative for congestion, sore throat, rhinorrhea, sneezing, trouble swallowing and sinus pressure.  Eyes: Negative for photophobia and visual disturbance.  Respiratory: Negative for cough, chest tightness, shortness of breath, wheezing and stridor.  Cardiovascular: Negative for chest pain, palpitations and leg swelling.  Gastrointestinal: Negative for nausea, vomiting, abdominal pain, diarrhea, constipation, blood in stool, abdominal distention and anal bleeding.  Genitourinary: Negative for dysuria, hematuria, flank pain and difficulty urinating.  Musculoskeletal: Negative for myalgias, back pain, joint swelling, arthralgias and gait problem.  Skin: Negative for color change, pallor, rash and wound.  Neurological: Negative for dizziness, tremors, weakness and light-headedness.  Hematological: Negative for adenopathy. Does not bruise/bleed easily.  Psychiatric/Behavioral: Negative for behavioral problems, confusion, sleep disturbance, dysphoric mood, decreased concentration and agitation.    Physical Exam   BP 133/82   Pulse 91   Temp 98 F (36.7 C) (Oral)   Wt 152 lb (68.9 kg)   BMI 25.29 kg/m   Physical Exam  Constitutional:  oriented to person, place, and time. appears well-developed and well-nourished.  No distress.  HENT: /AT, PERRLA, no scleral icterus Mouth/Throat: Oropharynx is clear and moist. No  oropharyngeal exudate.  Cardiovascular: Normal rate, regular rhythm and normal heart sounds. Exam reveals no gallop and no friction rub.  No murmur heard.  Pulmonary/Chest: Effort normal and breath sounds normal. No respiratory distress.  has no wheezes.  Neck = supple, no nuchal rigidity Abdominal: Soft. Bowel sounds are normal.  exhibits no distension. There is no tenderness.  Lymphadenopathy: no cervical adenopathy. No axillary adenopathy Neurological: alert and oriented to person, place, and time.  Skin: Skin is warm and dry. No rash noted. No erythema.  Psychiatric: a normal mood and affect.  behavior is normal.   Lab Results  Component Value Date   CD4TCELL 16 (L) 12/25/2016   Lab Results  Component Value Date   CD4TABS 550 12/25/2016   CD4TABS 650 09/04/2016   CD4TABS 750 05/23/2016   Lab Results  Component Value Date   HIV1RNAQUANT 36,580 (H) 12/25/2016   Lab Results  Component Value Date   HEPBSAB POS (A) 09/13/2015   Lab Results  Component Value Date   LABRPR NON REAC 09/04/2016    CBC Lab Results  Component Value Date   WBC 6.3 12/25/2016   RBC 3.90 12/25/2016   HGB 11.5 (L) 12/25/2016   HCT 35.2 12/25/2016   PLT 132 (L) 12/25/2016   MCV 90.3 12/25/2016   MCH 29.5 12/25/2016   MCHC 32.7 12/25/2016   RDW 17.3 (H) 12/25/2016   LYMPHSABS 3,591 12/25/2016   MONOABS 630 12/25/2016   EOSABS 189 12/25/2016    BMET Lab Results  Component Value Date   NA 139 12/25/2016   K 4.1 12/25/2016   CL 105 12/25/2016   CO2 27 12/25/2016   GLUCOSE 79 12/25/2016   BUN 8 12/25/2016   CREATININE 0.77 12/25/2016   CALCIUM 9.5 12/25/2016   GFRNONAA >60 09/25/2016   GFRAA >60 09/25/2016      Assessment and Plan  HIV disease- poorly controlled Will check viral load again with genotype with plan to change her regimen   hiv adherence = spent 25 min discuss methods to improve adherence  White matter changes on MRI- Unclear of its significance, especially  since she is not virologically suppressed Will review images with radiology to decide if need LP for further investigation Presently does not have headache. Will check Cr Ag in serum, but CD 4 > 200.

## 2017-02-14 LAB — HIV-1 RNA,QN PCR W/REFLEX GENOTYPE
HIV-1 RNA, QN PCR: 168000 Copies/mL — ABNORMAL HIGH
HIV-1 RNA, QN PCR: 5.23 Log cps/mL — ABNORMAL HIGH

## 2017-02-19 LAB — HIV-1 INTEGRASE GENOTYPE

## 2017-02-19 LAB — HIV-1 GENOTYPR PLUS

## 2017-03-05 ENCOUNTER — Other Ambulatory Visit: Payer: Self-pay | Admitting: Internal Medicine

## 2017-03-12 ENCOUNTER — Ambulatory Visit: Payer: Medicaid Other | Admitting: Internal Medicine

## 2017-03-24 ENCOUNTER — Ambulatory Visit (INDEPENDENT_AMBULATORY_CARE_PROVIDER_SITE_OTHER): Payer: Medicaid Other | Admitting: Neurology

## 2017-03-24 ENCOUNTER — Encounter: Payer: Self-pay | Admitting: Neurology

## 2017-03-24 VITALS — BP 124/72 | HR 105 | Resp 20 | Ht 65.0 in | Wt 155.0 lb

## 2017-03-24 DIAGNOSIS — R400 Somnolence: Secondary | ICD-10-CM

## 2017-03-24 DIAGNOSIS — B20 Human immunodeficiency virus [HIV] disease: Secondary | ICD-10-CM

## 2017-03-24 DIAGNOSIS — R519 Headache, unspecified: Secondary | ICD-10-CM

## 2017-03-24 DIAGNOSIS — R51 Headache: Secondary | ICD-10-CM | POA: Diagnosis not present

## 2017-03-24 NOTE — Progress Notes (Signed)
Subjective:    Patient ID: Kelsey Rodriguez is a 26 y.o. female.  HPI     Interim history:   Kelsey Rodriguez is a very pleasant 26 year old right-handed woman with an underlying complex medical history of HIV, TB, aseptic meningitis for which she was in the hospital in October 2017, adrenal insufficiency, bilateral necrosis of the hip, s/p b/l TKAs, and abnormal brain MRI recently, who returns for follow-up consultation of her recurrent headaches. The patient is unaccompanied today. I first met her on 01/07/2017 at the request of her primary care provider, at which time she reported a several month history of recurrent headaches. She had a recent brain MRI which we reviewed. She had tried gabapentin for headache prevention which she felt was not helpful. I suggested a trial of amitriptyline with gradual titration. I did order a sleep study based on her complaints of snoring, nonrestorative sleep, morning headaches as well as nocturia. She had a baseline sleep study on 01/19/2017 which showed no significant OSA. Her sleep efficiency was high normal at 96%, sleep latency 3.5 minutes, REM latency 45.5 minutes which is mildly reduced. She had an increased percentage of stage II sleep, absence of slow-wave sleep and normal percentage of REM sleep. She had a total AHI of 1.1 per hour, average oxygen saturation was 95%, nadir was 88%.  Today, 03/24/2017 (all dictated new, as well as above notes, some dictation done in note pad or Word, outside of chart, may appear as copied):   She reports doing better with the HAs, no longer has the daily headaches, maybe 2 times a week. She feels like she may be able to fall asleep a little better too. Had recent check up with ID and viral load was too high and they are thinking about changing her HIV meds. She has had no new neurological symptoms, feels stable. Worries about her HIV labs being abnormal.   The patient's allergies, current medications, family history, past medical  history, past social history, past surgical history and problem list were reviewed and updated as appropriate.   Previously (copied from previous notes for reference):   01/07/2017: She reports recurrent headaches, sometimes throbbing, sometimes tightness. Has been going for a few months, slightly better in the past month or so. Had nearly daily headaches for a while, less frequent at this time. Tried gabapentin 300 mg bid, but did not help. She has occasional blurry vision with her headache, no family history of migraines, no personal history of migraines. She does not sleep very well and is tired during the day, does not typically nap. She has not worked in years but is going to start a part-time job as I understand. She has been seen by an optometrist. She is a nonsmoker, does not use illicit drugs, drinks alcohol socially, does not utilize any daily caffeine. Aleve has been helpful for her headaches, sometimes Tylenol helps. Headache location is bifrontal or all over, sometimes starts in the front and radiates to the back, typically not one-sided, typically no nausea or vomiting. She has a history of Bell's palsy on the left side over 5 years ago, she says she was in [place] when she first had this. Since then she had some recurrence of Bell's palsy type symptoms but denies any other one-sided weakness or numbness or tingling. Does not know if she snores, does not sleep well, has nocturia 1-2 times per night. wakes up with HA at times. Has LBP and was supposed to have ESI, but opted out.  She has woken herself up with gasping.  She had a brain MRI with and without contrast on 11/21/2016: IMPRESSION: Bilateral cerebral white matter disease that is new from 2014. Asymmetric appearance and lack of atrophy would favor early PML over HIV encephalitis. In this young female, noninfectious demyelination is also considered.  Her Past Medical History Is Significant For: Past Medical History:  Diagnosis Date   . 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 Adrenal insufficiency ([place])   . Anemia of chronic disease 03/11/2012  . Bell's palsy 08/26/2013  . 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 Chronic back pain   . Chronic leg pain    bilateral knees, ankles  . Fatigue   . 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 HIV (human immunodeficiency virus infection) ([place]) 02/2012  . Laceration of ankle, right 11/18/2012  . Lumbar radiculopathy   . Pelvic pain   . Reflux esophagitis 03/11/2012  . Tuberculosis   . 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 Vertigo     Her Past Surgical History Is Significant For: Past Surgical History:  Procedure Laterality Date  . APPENDECTOMY  ~ 2000  . DILATION AND CURETTAGE OF UTERUS  2008  . ESOPHAGOGASTRODUODENOSCOPY  03/11/2012   Procedure: ESOPHAGOGASTRODUODENOSCOPY (EGD);  Surgeon: Lafayette Dragon, MD;  Location: [facility] ENDOSCOPY;  Service: Endoscopy;  Laterality: N/A;  . ESOPHAGOGASTRODUODENOSCOPY N/A 03/07/2014   Procedure: ESOPHAGOGASTRODUODENOSCOPY (EGD);  Surgeon: Gatha Mayer, MD;  Location: [facility] ENDOSCOPY;  Service: Endoscopy;  Laterality: N/A;  . LUNG BIOPSY  02/2012  . TOTAL HIP ARTHROPLASTY Left 12/14/2015   Procedure: LEFT TOTAL HIP ARTHROPLASTY ANTERIOR APPROACH;  Surgeon: Mcarthur Rossetti, MD;  Location: [facility] ORS;  Service: Orthopedics;  Laterality: Left;  . TOTAL HIP ARTHROPLASTY Right 04/04/2016   Procedure: RIGHT TOTAL HIP ARTHROPLASTY ANTERIOR APPROACH;  Surgeon: Mcarthur Rossetti, MD;  Location: [facility] ORS;  Service: Orthopedics;  Laterality: Right;    Her Family History Is Significant For: Family History  Problem Relation Age of Onset  . Heart disease Father     Vague not clearly cardiac    Her Social History Is Significant For: Social History   Social History  . Marital status: Single    Spouse name: N/A  . Number of children: 0  . Years of education: college   Social History Main Topics  . Smoking status: Never Smoker  . Smokeless tobacco:  Never Used  . Alcohol use 0.0 oz/week     Comment: socially  . Drug use: No  . Sexual activity: Not Currently    Partners: Male    Birth control/ protection: None     Comment: pt. given condoms   Other Topics Concern  . 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     Drinks about 1 soda a day     Her Allergies Are:  Allergies  Allergen Reactions  . Hydrocodone Itching and Nausea Only    Tolerates Oxycodone  . Tramadol Itching and Nausea Only    Tolerates oxycodone  :   Her Current Medications Are:  Outpatient Encounter Prescriptions as of 03/24/2017  Medication Sig  . amitriptyline (ELAVIL) 25 MG tablet 1/2 pill each bedtime x 1 week, then 1 pill nightly x 1 week, then 1 1/2 pills nightly x 1 week, then 2 pills nightly thereafter.  [name] GENVOYA 150-150-200-10 MG TABS tablet TAKE 1 TABLET BY MOUTH DAILY WITH BREAKFAST, STOP PREZCOBIX/DESCOVY  . meclizine (ANTIVERT) 32 MG tablet Take 1  tablet (32 mg total) by mouth 3 (three) times daily as needed. If needed for "room spinning" dizziness  . polyethylene glycol powder (GLYCOLAX/MIRALAX) powder Take 17 g by mouth 2 (two) times daily as needed.  [name] PREZISTA 800 MG tablet TAKE 1 TABLET BY MOUTH DAILY WITH BREAKFAST   No facility-administered encounter medications on file as of 03/24/2017.   :  Review of Systems:  Out of a complete 14 point review of systems, all are reviewed and negative with the exception of these symptoms as listed below: Review of Systems  Neurological:       Pt presents today to discuss her sleep study results and her headaches. Pt says her headaches are better. Pt is only taking elavil 70m qhs instead of 528mqhs. Pt reports feeling tired most of the time.    Objective:  Neurologic Exam  Physical Exam Physical Examination:   Vitals:   03/24/17 1412  BP: 124/72  Pulse: (!) 105  Resp: 20    General Examination: The patient is a very pleasant [age].o. female in no acute  distress. She appears well-developed and well-nourished and well groomed.   HEENT: Normocephalic, atraumatic, pupils are equal, round and reactive to light and accommodation. Extraocular tracking is good without limitation to gaze excursion or nystagmus noted. Normal smooth pursuit is noted. Hearing is grossly intact. Tympanic membranes are clear bilaterally. Face is symmetric with normal facial animation and normal facial sensation. Speech is clear with no dysarthria noted. There is no hypophonia. There is no lip, neck/head, jaw or voice tremor. Neck is supple with full range of passive and active motion. There are no carotid bruits on auscultation. Oropharynx exam reveals: mild mouth dryness, good dental hygiene. Tongue protrudes centrally and palate elevates symmetrically.    Chest: Clear to auscultation without wheezing, rhonchi or crackles noted.  Heart: S1+S2+0, regular and normal without murmurs, rubs or gallops noted.   Abdomen: Soft, non-tender and non-distended with normal bowel sounds appreciated on auscultation.  Extremities: There is no pitting edema in the distal lower extremities bilaterally. Pedal pulses are intact.  Skin: Warm and dry without trophic changes noted.  Musculoskeletal: exam reveals no obvious joint deformities, tenderness or joint swelling or erythema.   Neurologically:  Mental status: The patient is awake, alert and oriented in all 4 spheres. Her immediate and remote memory, attention, language skills and fund of knowledge are appropriate. There is no evidence of aphasia, agnosia, apraxia or anomia. Speech is clear with normal prosody and enunciation. Thought process is linear. Mood is normal and affect is normal.  Cranial nerves II - XII are as described above under HEENT exam. In addition: shoulder shrug is normal with equal shoulder height noted. Motor exam: Normal bulk, strength and tone is noted. There is no drift, tremor or rebound. Romberg is negative.  Reflexes are 2+ throughout. Fine motor skills and coordination: intact with normal finger taps, normal hand movements, normal rapid alternating patting, normal foot taps and normal foot agility.  Cerebellar testing: No dysmetria or intention tremor on finger to nose testing. Heel to shin is unremarkable bilaterally. There is no truncal or gait ataxia.  Sensory exam: intact to light touch in the upper and lower extremities.  Gait, station and balance: She stands easily. No veering to one side is noted. No leaning to one side is noted. Posture is age-appropriate and stance is narrow based. Gait shows normal stride length and normal pace. No problems turning are noted. Tandem walk is unremarkable.  Assessment and Plan:  In summary, Kelsey Rodriguez is a very pleasant 26 y.o.-year old female with an underlying complex medical history of HIV, TB, aseptic meningitis for which she was in the hospital in October 2017, adrenal insufficiency, bilateral necrosis of the hip, s/p b/l TKAs, and abnormal brain MRI recently, whoPresents for follow-up consultation of her recurrent headaches. She also had an interim sleep study on 01/19/2017 which showed no significant obstructive sleep disordered breathing or significant PLMS. Her headaches have improved, she is on amitriptyline 25 mg each night. She also indicates that she is sleeping a little better with the amitriptyline. She continues to have some daytime fatigue and daytime sleepiness and tiredness which I explained to her is probably more than one reason due to more than one factor. She is working with her ID specialist on improving her HIV treatment. She is worried about her recent HIV labs being abnormal. She may be embarking on a different medication regimen soon. Neurologically, she has had no new symptoms. Exam is stable. She is reassured in that regard. She is advised to continue with amitriptyline 25 g each night, she could try to increase it very slightly by adding a  half pill at night for total of 37.5 mg and up to 50 mg at night if tolerated. She is advised to stay well-hydrated. We talked about her sleep study results in detail. She is advised to follow-up in about 6 months, she can see one of our nurse practitioners at the time. She did have an abnormal brain MRI in 12/17. She may benefit from a repeat MRI down the [place]. Clinically, she appears well neurologically at this time, in fact, improved as far as the headaches go. I answered all her questions today and she was in agreement. I spent 25 minutes in total face-to-face time with the patient, more than 50% of which was spent in counseling and coordination of care, reviewing test results, reviewing medication and discussing or reviewing the diagnosis of recurrent HAs, its prognosis and treatment options. Pertinent laboratory and imaging test results that were available during this visit with the patient were reviewed by me and considered in my medical decision making (see chart for details).

## 2017-03-24 NOTE — Patient Instructions (Addendum)
Please remember, common headache triggers are: sleep deprivation, dehydration, overheating, stress, hypoglycemia or skipping meals and blood sugar fluctuations, excessive pain medications or excessive alcohol use or caffeine withdrawal. Some people have food triggers such as aged cheese, orange juice or chocolate, especially dark chocolate, or MSG (monosodium glutamate). Try to avoid these headache triggers as much possible. It may be helpful to keep a headache diary to figure out what makes your headaches worse or brings them on and what alleviates them. Some people report headache onset after exercise but studies have shown that regular exercise may actually prevent headaches from coming. If you have exercise-induced headaches, please make sure that you drink plenty of fluid before and after exercising and that you do not over do it and do not overheat.  I would like for you to continue with the amitriptyline 25 mg each night and if needed you can gradually go up by adding a half pill first and then maybe up to 2 pills at bedtime. Common side effects reported are: mouth dryness, drowsiness, confusion, dizziness.

## 2017-04-13 ENCOUNTER — Ambulatory Visit (INDEPENDENT_AMBULATORY_CARE_PROVIDER_SITE_OTHER): Payer: Medicaid Other | Admitting: Internal Medicine

## 2017-04-13 ENCOUNTER — Encounter: Payer: Self-pay | Admitting: Internal Medicine

## 2017-04-13 VITALS — BP 123/79 | HR 103 | Temp 98.5°F | Wt 157.0 lb

## 2017-04-13 DIAGNOSIS — N921 Excessive and frequent menstruation with irregular cycle: Secondary | ICD-10-CM | POA: Diagnosis not present

## 2017-04-13 DIAGNOSIS — Z9119 Patient's noncompliance with other medical treatment and regimen: Secondary | ICD-10-CM

## 2017-04-13 DIAGNOSIS — Z23 Encounter for immunization: Secondary | ICD-10-CM

## 2017-04-13 DIAGNOSIS — M5440 Lumbago with sciatica, unspecified side: Secondary | ICD-10-CM | POA: Diagnosis not present

## 2017-04-13 DIAGNOSIS — B20 Human immunodeficiency virus [HIV] disease: Secondary | ICD-10-CM | POA: Diagnosis present

## 2017-04-13 DIAGNOSIS — Z91199 Patient's noncompliance with other medical treatment and regimen due to unspecified reason: Secondary | ICD-10-CM

## 2017-04-13 MED ORDER — MENINGOCOCCAL A C Y&W-135 OLIG IM SOLR
0.5000 mL | Freq: Once | INTRAMUSCULAR | Status: AC
  Administered 2017-04-13: 0.5 mL via INTRAMUSCULAR

## 2017-04-13 NOTE — Progress Notes (Signed)
RFV: follow up for HIV disease  Patient ID: Kelsey Rodriguez, female   DOB: Oct 22, 1991, 26 y.o.   MRN: [id number]  HPI 26yo F with hiv disease, CD 4 count 550/VL  (Jan 2018)36,580. genvoya-DRV. Has been missing 5 doses in 1 month. She states that she forgets once coming home from work. She still has chronic headaches but she is not taking amiltryptiline daily since it causes her to be groggy in the morning.  Just received her citizenship.   ROS:  Menstrual cycle, irregular. Heavy clots, abdominal cramping- all througout her cycle.  Outpatient Encounter Prescriptions as of 04/13/2017  Medication Sig  . amitriptyline (ELAVIL) 25 MG tablet 1/2 pill each bedtime x 1 week, then 1 pill nightly x 1 week, then 1 1/2 pills nightly x 1 week, then 2 pills nightly thereafter.  [name] GENVOYA 150-150-200-10 MG TABS tablet TAKE 1 TABLET BY MOUTH DAILY WITH BREAKFAST, STOP PREZCOBIX/DESCOVY  . meclizine (ANTIVERT) 32 MG tablet Take 1 tablet (32 mg total) by mouth 3 (three) times daily as needed. If needed for "room spinning" dizziness  . PREZISTA 800 MG tablet TAKE 1 TABLET BY MOUTH DAILY WITH BREAKFAST  . polyethylene glycol powder (GLYCOLAX/MIRALAX) powder Take 17 g by mouth 2 (two) times daily as needed. (Patient not taking: Reported on 04/13/2017)   No facility-administered encounter medications on file as of 04/13/2017.      Patient Active Problem List   Diagnosis Date Noted  . Complex regional pain syndrome 02/03/2017  . Constipation 02/03/2017  . Irregular menstrual cycle 02/03/2017  . 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 Avascular necrosis of bone of right hip ([place]) 04/04/2016  . 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 Avascular necrosis of bone of left hip ([place]) 12/14/2015  . 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 Overweight (BMI 25.0-29.9) 04/20/2015  . Seasonal allergies 03/20/2015  . Menorrhagia 03/19/2015  . Back pain of lumbar region with sciatica  02/12/2015  . Primary adrenal insufficiency ([place]) 01/03/2015  . Laceration of ankle, right 11/18/2012  . 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 Arthralgia 05/20/2012  . HIV (human immunodeficiency virus infection) ([place]) 03/16/2012  . 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 Iron deficiency anemia 03/11/2012  . Reflux esophagitis 03/11/2012     There are no preventive care reminders to display for this patient.   Review of Systems Per hpi, otherwise 12 point ros is negative Physical Exam   BP 123/79   Pulse (!) 103   Temp 98.5 F (36.9 C) (Oral)   Wt 157 lb (71.2 kg)   BMI 26.13 kg/m   Physical Exam  Constitutional:  oriented to person, place, and time. appears well-developed and well-nourished. No distress.  HENT: [place]/AT, PERRLA, no scleral icterus Mouth/Throat: Oropharynx is clear and moist. No oropharyngeal exudate.  Cardiovascular: Normal rate, regular rhythm and normal heart sounds. Exam reveals no gallop and no friction rub.  No murmur heard.  Pulmonary/Chest: Effort normal and breath sounds normal. No respiratory distress.  has no wheezes.  Neck = supple, no nuchal rigidity Abdominal: Soft. Bowel sounds are normal.  exhibits no distension. There is no tenderness.  Lymphadenopathy: no cervical adenopathy. No axillary adenopathy Neurological: alert and oriented to person, place, and time.  Skin: Skin is warm and dry. No rash noted. No erythema.  Psychiatric: a normal mood and affect.  behavior is normal.   Lab Results  Component Value Date   CD4TCELL 16 (L) 12/25/2016   Lab Results  Component Value Date   CD4TABS 550 12/25/2016   CD4TABS 650 09/04/2016   CD4TABS 750 05/23/2016   Lab Results  Component Value Date   HIV1RNAQUANT 36,580 (H) 12/25/2016   Lab Results  Component Value Date   HEPBSAB POS (A) 09/13/2015   Lab Results  Component Value Date   LABRPR NON REAC 09/04/2016    CBC Lab Results  Component Value Date   WBC 6.3 12/25/2016   RBC 3.90  12/25/2016   HGB 11.5 (L) 12/25/2016   HCT 35.2 12/25/2016   PLT 132 (L) 12/25/2016   MCV 90.3 12/25/2016   MCH 29.5 12/25/2016   MCHC 32.7 12/25/2016   RDW 17.3 (H) 12/25/2016   LYMPHSABS 3,591 12/25/2016   MONOABS 630 12/25/2016   EOSABS 189 12/25/2016    BMET Lab Results  Component Value Date   NA 139 12/25/2016   K 4.1 12/25/2016   CL 105 12/25/2016   CO2 27 12/25/2016   GLUCOSE 79 12/25/2016   BUN 8 12/25/2016   CREATININE 0.77 12/25/2016   CALCIUM 9.5 12/25/2016   GFRNONAA >60 09/25/2016   GFRAA >60 09/25/2016      Assessment and Plan  Irregular cycle/menorrhagia = refer to women's clinic  hiv disease= currently on genvoya-DRV. Will check VL to see if she is any better with virologic control.   Health maintenance = meningococcal vaccine  Low back pain = will try conservative measures such as streching and heat packs  Headache = amiptryptilinie take every night.   Addendum = VL still very elevated, I suspect she is missing more than 5 doses per month, will have her come in for adherence counseling

## 2017-04-15 LAB — HIV-1 RNA QUANT-NO REFLEX-BLD
HIV 1 RNA Quant: 198000 copies/mL — ABNORMAL HIGH
HIV-1 RNA Quant, Log: 5.3 Log copies/mL — ABNORMAL HIGH

## 2017-04-15 LAB — T-HELPER CELL (CD4) - (RCID CLINIC ONLY)
CD4 % Helper T Cell: 10 % — ABNORMAL LOW (ref 33–55)
CD4 T Cell Abs: 290 /uL — ABNORMAL LOW (ref 400–2700)

## 2017-04-16 ENCOUNTER — Other Ambulatory Visit: Payer: Self-pay | Admitting: *Deleted

## 2017-04-16 DIAGNOSIS — N921 Excessive and frequent menstruation with irregular cycle: Secondary | ICD-10-CM

## 2017-04-21 ENCOUNTER — Encounter: Payer: Self-pay | Admitting: Obstetrics & Gynecology

## 2017-04-23 ENCOUNTER — Ambulatory Visit (INDEPENDENT_AMBULATORY_CARE_PROVIDER_SITE_OTHER): Payer: Medicaid Other | Admitting: Pharmacist

## 2017-04-23 DIAGNOSIS — R110 Nausea: Secondary | ICD-10-CM

## 2017-04-23 DIAGNOSIS — B20 Human immunodeficiency virus [HIV] disease: Secondary | ICD-10-CM | POA: Diagnosis not present

## 2017-04-23 MED ORDER — EMTRICITABINE-TENOFOVIR AF 200-25 MG PO TABS: 1.0000 | ORAL_TABLET | Freq: Every day | ORAL | 5 refills | Status: DC

## 2017-04-23 MED ORDER — PROMETHAZINE HCL 12.5 MG PO TABS: 12.5000 mg | ORAL_TABLET | Freq: Three times a day (TID) | ORAL | 3 refills | Status: DC | PRN

## 2017-04-23 MED ORDER — DARUNAVIR-COBICISTAT 800-150 MG PO TABS: 1.0000 | ORAL_TABLET | Freq: Every day | ORAL | 5 refills | Status: DC

## 2017-04-23 MED ORDER — ONDANSETRON 8 MG PO TBDP: 8.0000 mg | ORAL_TABLET | Freq: Three times a day (TID) | ORAL | 3 refills | Status: DC | PRN

## 2017-04-23 NOTE — Progress Notes (Signed)
HPI: Kelsey Rodriguez is a 26 y.o. female who presents to the [name] clinic today for HIV follow-up.  She has not been taking her medications regularly.   Allergies: Allergies  Allergen Reactions  . Hydrocodone Itching and Nausea Only    Tolerates Oxycodone  . Tramadol Itching and Nausea Only    Tolerates oxycodone    Past Medical History: Past Medical History:  Diagnosis Date  . 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 Adrenal insufficiency ([place])   . Anemia of chronic disease 03/11/2012  . Bell's palsy 08/26/2013  . 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 Chronic back pain   . Chronic leg pain    bilateral knees, ankles  . Fatigue   . 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 HIV (human immunodeficiency virus infection) ([place]) 02/2012  . Laceration of ankle, right 11/18/2012  . Lumbar radiculopathy   . Pelvic pain   . Reflux esophagitis 03/11/2012  . Tuberculosis   . 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 Vertigo     Social History: Social History   Social History  . Marital status: Single    Spouse name: N/A  . Number of children: 0  . Years of education: college   Social History Main Topics  . Smoking status: Never Smoker  . Smokeless tobacco: Never Used  . Alcohol use 0.0 oz/week     Comment: socially  . Drug use: No  . Sexual activity: Not Currently    Partners: Male    Birth control/ protection: None     Comment: pt. given condoms   Other Topics Concern  . 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     Drinks about 1 soda a day     Current Regimen: Genvoya/Prezista  Labs: HIV 1 RNA Quant (copies/mL)  Date Value  04/13/2017 198,000 (H)  12/25/2016 36,580 (H)  09/04/2016 25 (H)   CD4 T Cell Abs (/uL)  Date Value  04/13/2017 290 (L)  12/25/2016 550  09/04/2016 650   Hep B S Ab (no units)  Date Value  09/13/2015 POS (A)   Hepatitis B Surface Ag (no units)  Date Value  03/13/2012 NEGATIVE   HCV Ab (no  units)  Date Value  03/13/2012 NEGATIVE    CrCl: CrCl cannot be calculated (Patient's most recent lab result is older than the maximum 21 days allowed.).  Lipids:    Component Value Date/Time   CHOL 215 (H) 12/25/2016 1550   TRIG 144 12/25/2016 1550   HDL 67 12/25/2016 1550   CHOLHDL 3.2 12/25/2016 1550   VLDL 29 12/25/2016 1550   LDLCALC 119 (H) 12/25/2016 1550    Assessment: Kelsey Rodriguez is here today for HIV follow-up.  Her viral load has been high the last 2 times we have checked it.  She tells Dr. Baxter Flattery that she is only missing 5-6 doses a month but her viral load is quite high, so I brought her back in here to discuss compliance.  She is definitely not taking her medications.  When I showed her the viral loads, she admitted that she has not been taking them regularly.  She states that since January, she has only been taking it at most 3 times per week.  She says she ran out 2-3 weeks ago and have not picked up her refills. I asked her why she has not been taking them regularly and she states that she is discouraged and does not like  swallowing pills.  She states that they are too big and that she gets nauseous after taking them. I then had a long talk with her about her health and how young she is and how she needed to take HIV medications. She needs to be on a PI, so we discussed options. She has no resistance right now, but I told her that she is very lucky she does not have any yet but she definitely will develop some if she keeps taking her medications a few times/week.  She is also going out of the country to [place] to visit her family for 3 months.  She is leaving May 31 and will not return until August before school starts.  She is asking for a 90 day supply to last her that long.  She has Medicaid, which is a problem because they only fill 30 days at a time.  Will start her on Prezcobix + Descovy -- she can split the Prezcobix in half (I gave her a pill cutter). I sent them to  [facility]. She will pick up today.  I also sent in 90 day supplies of Phenergan and Zofran ODT and told her to premedicate with one of them before taking her medications. I also gave her a pill box, 2 pill box keychains, and told her to download an app on her phone to keep track of her compliance and remind her to take her medications.  She will come back to see Dr. Baxter Flattery and I in 2 weeks.  I told her to bring her pill bottles with her so I could count them.  We will do labs then.  I will work on [employer] and getting her a 90 day supply before she returns.   Plans: - Stop Genvoya/Prezista - Start Prezcobix 1 pill PO once daily - Start Descovy 200-25 mg PO once daily - Sent to [facility] - Zofran 8 mg ODT PRN - Phenergan 12.5 mg PO PRN - F/u with me and Dr. Baxter Flattery 5/24 at [facility]. Kaegan Stigler, PharmD, [place] [facility] 04/23/2017, 2:36 PM

## 2017-04-23 NOTE — Patient Instructions (Signed)
Please pick up Prezcobix + Descovy at the [facility] today.   Call [phone] if you need anything.

## 2017-04-30 ENCOUNTER — Ambulatory Visit (HOSPITAL_COMMUNITY)
Admission: RE | Admit: 2017-04-30 | Discharge: 2017-04-30 | Disposition: A | Discharge status: Home / Self Care | Payer: Medicaid Other | Source: Ambulatory Visit | Attending: Obstetrics & Gynecology | Admitting: Obstetrics & Gynecology

## 2017-04-30 DIAGNOSIS — N83202 Unspecified ovarian cyst, left side: Secondary | ICD-10-CM | POA: Diagnosis not present

## 2017-04-30 DIAGNOSIS — N7011 Chronic salpingitis: Secondary | ICD-10-CM | POA: Insufficient documentation

## 2017-04-30 DIAGNOSIS — N921 Excessive and frequent menstruation with irregular cycle: Secondary | ICD-10-CM | POA: Insufficient documentation

## 2017-05-07 ENCOUNTER — Encounter: Payer: Self-pay | Admitting: Internal Medicine

## 2017-05-07 ENCOUNTER — Ambulatory Visit (INDEPENDENT_AMBULATORY_CARE_PROVIDER_SITE_OTHER): Payer: Medicaid Other | Admitting: Internal Medicine

## 2017-05-07 ENCOUNTER — Ambulatory Visit (INDEPENDENT_AMBULATORY_CARE_PROVIDER_SITE_OTHER): Payer: Medicaid Other | Admitting: Pharmacist

## 2017-05-07 VITALS — BP 126/76 | HR 84 | Temp 98.3°F | Ht 65.0 in | Wt 160.0 lb

## 2017-05-07 DIAGNOSIS — Z9119 Patient's noncompliance with other medical treatment and regimen: Secondary | ICD-10-CM

## 2017-05-07 DIAGNOSIS — N921 Excessive and frequent menstruation with irregular cycle: Secondary | ICD-10-CM | POA: Diagnosis not present

## 2017-05-07 DIAGNOSIS — B20 Human immunodeficiency virus [HIV] disease: Secondary | ICD-10-CM

## 2017-05-07 DIAGNOSIS — Z91199 Patient's noncompliance with other medical treatment and regimen due to unspecified reason: Secondary | ICD-10-CM

## 2017-05-07 NOTE — Progress Notes (Signed)
Patient ID: Kelsey Rodriguez, female   DOB: July 16, 1991, 26 y.o.   MRN: [id number]  HPI 26yo F with HIV disease, Cd 4 count of 290/VL 198K (april 2018), has been on ARt for roughly 2-3 weeks for which she has been taking daily. She is leaving on may 31st to go to [place] for roughly 1-3 months. We have arranged for her to get her ART. She is also interested in getting vaccines for her trip. She is doing better with adherence and importance of taking meds  She was referred to gyn but has not seen them as of yet. She started on menses currently on day 5. She had TV ultrasound  I have reviewed her records in  link  Outpatient Encounter Prescriptions as of 05/07/2017  Medication Sig  . amitriptyline (ELAVIL) 25 MG tablet 1/2 pill each bedtime x 1 week, then 1 pill nightly x 1 week, then 1 1/2 pills nightly x 1 week, then 2 pills nightly thereafter.  . darunavir-cobicistat (PREZCOBIX) 800-150 MG tablet Take 1 tablet by mouth daily with supper.  [name] emtricitabine-tenofovir AF (DESCOVY) 200-25 MG tablet Take 1 tablet by mouth daily.  . meclizine (ANTIVERT) 32 MG tablet Take 1 tablet (32 mg total) by mouth 3 (three) times daily as needed. If needed for "room spinning" dizziness  . ondansetron (ZOFRAN ODT) 8 MG disintegrating tablet Take 1 tablet (8 mg total) by mouth every 8 (eight) hours as needed for nausea or vomiting.  . promethazine (PHENERGAN) 12.5 MG tablet Take 1 tablet (12.5 mg total) by mouth every 8 (eight) hours as needed for nausea or vomiting.  . polyethylene glycol powder (GLYCOLAX/MIRALAX) powder Take 17 g by mouth 2 (two) times daily as needed. (Patient not taking: Reported on 04/13/2017)   No facility-administered encounter medications on file as of 05/07/2017.      Patient Active Problem List   Diagnosis Date Noted  . Complex regional pain syndrome 02/03/2017  . Constipation 02/03/2017  . Irregular menstrual cycle 02/03/2017  . Chronic postoperative pain 05/16/2016    . Avascular necrosis of bone of right hip ([place]) 04/04/2016  . 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 Avascular necrosis of bone of left hip ([place]) 12/14/2015  . 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 Overweight (BMI 25.0-29.9) 04/20/2015  . Seasonal allergies 03/20/2015  . Menorrhagia 03/19/2015  . Back pain of lumbar region with sciatica 02/12/2015  . Primary adrenal insufficiency ([place]) 01/03/2015  . Laceration of ankle, right 11/18/2012  . 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 Arthralgia 05/20/2012  . HIV (human immunodeficiency virus infection) ([place]) 03/16/2012  . 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 Iron deficiency anemia 03/11/2012  . Reflux esophagitis 03/11/2012     There are no preventive care reminders to display for this patient.   Review of Systems +menstrual cycle started. 12 point ros is negative Soc hx: non smoker Physical Exam   BP 126/76   Pulse 84   Temp 98.3 F (36.8 C) (Oral)   Ht 5\' 5"  (1.651 m)   Wt 160 lb (72.6 kg)   LMP 04/03/2017 (Exact Date)   BMI 26.63 kg/m   Physical Exam  Constitutional:  oriented to person, place, and time. appears well-developed and well-nourished. No distress.  HENT: [place]/AT, PERRLA, no scleral icterus Mouth/Throat: Oropharynx is clear and moist. No oropharyngeal exudate.  Cardiovascular: Normal rate, regular rhythm and normal heart sounds. Exam reveals no gallop and no friction rub.  No murmur heard.  Pulmonary/Chest: Effort normal and breath sounds normal. No respiratory distress.  has no wheezes.  Lymphadenopathy: no cervical adenopathy. No axillary adenopathy Skin: Skin is warm and dry. No rash noted. No erythema.  Psychiatric: a normal mood and affect.  behavior is normal.   Lab Results  Component Value Date   CD4TCELL 10 (L) 04/13/2017   Lab Results  Component Value Date   CD4TABS 290 (L) 04/13/2017   CD4TABS 550 12/25/2016   CD4TABS 650 09/04/2016   Lab Results  Component  Value Date   HIV1RNAQUANT 198,000 (H) 04/13/2017   Lab Results  Component Value Date   HEPBSAB POS (A) 09/13/2015   Lab Results  Component Value Date   LABRPR NON REAC 09/04/2016    CBC Lab Results  Component Value Date   WBC 6.3 12/25/2016   RBC 3.90 12/25/2016   HGB 11.5 (L) 12/25/2016   HCT 35.2 12/25/2016   PLT 132 (L) 12/25/2016   MCV 90.3 12/25/2016   MCH 29.5 12/25/2016   MCHC 32.7 12/25/2016   RDW 17.3 (H) 12/25/2016   LYMPHSABS 3,591 12/25/2016   MONOABS 630 12/25/2016   EOSABS 189 12/25/2016    BMET Lab Results  Component Value Date   NA 139 12/25/2016   K 4.1 12/25/2016   CL 105 12/25/2016   CO2 27 12/25/2016   GLUCOSE 79 12/25/2016   BUN 8 12/25/2016   CREATININE 0.77 12/25/2016   CALCIUM 9.5 12/25/2016   GFRNONAA >60 09/25/2016   GFRAA >60 09/25/2016     Assessment and Plan  hiv disease = we will see her back next week just prior to her trip to repeat labs. Roughly it will be 4 wk of taking meds  Adherence counseling = applauded good effort thus far but needs to continue and emphasize taking meds daily  irreg menses = recommend to follow up with gyn  pretravel counseling = will need typhoid vaccine. Can possibly get it cheaper in [place]. Will recommend IM injection form of typhoid vaccination. Otherwise uptodate on other vaccines

## 2017-05-07 NOTE — Progress Notes (Signed)
HPI: Kelsey Rodriguez is a 26 y.o. female who presents to the [facility] for an adherence check and HIV follow-up.   Allergies: Allergies  Allergen Reactions  . Hydrocodone Itching and Nausea Only    Tolerates Oxycodone  . Tramadol Itching and Nausea Only    Tolerates oxycodone    Past Medical History: Past Medical History:  Diagnosis Date  . 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 Adrenal insufficiency ([place])   . Anemia of chronic disease 03/11/2012  . Bell's palsy 08/26/2013  . 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 Chronic back pain   . Chronic leg pain    bilateral knees, ankles  . Fatigue   . 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 HIV (human immunodeficiency virus infection) ([place]) 02/2012  . Laceration of ankle, right 11/18/2012  . Lumbar radiculopathy   . Pelvic pain   . Reflux esophagitis 03/11/2012  . Tuberculosis   . 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 Vertigo     Social History: Social History   Social History  . Marital status: Single    Spouse name: N/A  . Number of children: 0  . Years of education: college   Social History Main Topics  . Smoking status: Never Smoker  . Smokeless tobacco: Never Used  . Alcohol use 0.0 oz/week     Comment: socially  . Drug use: No  . Sexual activity: Not Currently    Partners: Male    Birth control/ protection: None     Comment: pt. given condoms   Other Topics Concern  . 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     Drinks about 1 soda a day     Current Regimen: Prezcobix + Descovy  Labs: HIV 1 RNA Quant (copies/mL)  Date Value  04/13/2017 198,000 (H)  12/25/2016 36,580 (H)  09/04/2016 25 (H)   CD4 T Cell Abs (/uL)  Date Value  04/13/2017 290 (L)  12/25/2016 550  09/04/2016 650   Hep B S Ab (no units)  Date Value  09/13/2015 POS (A)   Hepatitis B Surface Ag (no units)  Date Value  03/13/2012 NEGATIVE   HCV Ab (no units)  Date Value  03/13/2012  NEGATIVE    CrCl: CrCl cannot be calculated (Patient's most recent lab result is older than the maximum 21 days allowed.).  Lipids:    Component Value Date/Time   CHOL 215 (H) 12/25/2016 1550   TRIG 144 12/25/2016 1550   HDL 67 12/25/2016 1550   CHOLHDL 3.2 12/25/2016 1550   VLDL 29 12/25/2016 1550   LDLCALC 119 (H) 12/25/2016 1550    Assessment: Kelsey Rodriguez is here today for HIV follow-up and an adherence check. She started Prezcobix + Descovy on May 10th.  She had 17 pills left in each of her Prezcobix and Descovy bottles which means she has not missed a dose so far. She states she is doing ok on them.  She still doesn't love taking them but knows she has to and will continue to take them hopefully.  She takes a Zofran 25 mins before taking her pills and it seems to help the nausea tremendously.  We were able to get her some supplies of medications for her [place] trip.  She will see Dr. Baxter Flattery today and call me when she returns for a f/u visit.   Plans: - Continue Prezcobix + Descovy - Call me when you get back from [place]  Kelsey Rodriguez, PharmD, [facility] 05/07/2017, 3:36 PM

## 2017-06-01 ENCOUNTER — Encounter: Payer: Medicaid Other | Admitting: Obstetrics & Gynecology

## 2017-06-09 ENCOUNTER — Encounter: Payer: Medicaid Other | Admitting: Obstetrics & Gynecology

## 2017-06-09 ENCOUNTER — Encounter: Payer: Self-pay | Admitting: General Practice

## 2017-08-13 ENCOUNTER — Telehealth: Payer: Self-pay | Admitting: Pharmacist

## 2017-08-13 ENCOUNTER — Other Ambulatory Visit: Payer: Self-pay | Admitting: Pharmacist

## 2017-08-13 NOTE — Telephone Encounter (Signed)
Finally able to get in touch with [place].  She is back from [place] - returned yesterday.  She states she took her medications every day while she was gone, but traveled somewhere different the last 3 weeks of her trip and left her medications behind, so she has not taken the Prezcobix/Descovy in 3 weeks.  States she is also having some kind of "skin irritation". She states she has some bumps that come and go on her legs.  She would like Dr. Baxter Flattery to see them. I made an appt with Dr. Baxter Flattery for her first available - 09/01/16 at 930 am. Told her to call if they got worse. They have been coming and going for ~1 month.  Encouraged her to not miss anymore doses.  She has ~3 weeks of medications left right now.

## 2017-08-24 ENCOUNTER — Ambulatory Visit (INDEPENDENT_AMBULATORY_CARE_PROVIDER_SITE_OTHER): Payer: Medicaid Other

## 2017-08-24 ENCOUNTER — Ambulatory Visit (INDEPENDENT_AMBULATORY_CARE_PROVIDER_SITE_OTHER): Payer: Medicaid Other | Admitting: Physician Assistant

## 2017-08-24 ENCOUNTER — Encounter (INDEPENDENT_AMBULATORY_CARE_PROVIDER_SITE_OTHER): Payer: Self-pay | Admitting: Physician Assistant

## 2017-08-24 DIAGNOSIS — M25551 Pain in right hip: Secondary | ICD-10-CM

## 2017-08-24 DIAGNOSIS — Z96641 Presence of right artificial hip joint: Secondary | ICD-10-CM | POA: Diagnosis not present

## 2017-08-24 DIAGNOSIS — M5441 Lumbago with sciatica, right side: Secondary | ICD-10-CM

## 2017-08-24 HISTORY — DX: Lumbago with sciatica, right side: M54.41

## 2017-08-24 NOTE — Progress Notes (Signed)
Office Visit Note   Patient: Kelsey Rodriguez           Date of Birth: 1991/09/04           MRN: [id number] Visit Date: 08/24/2017              Requested by: Everrett Coombe, MD [address] [place], [place] [address] PCP: Everrett Coombe, MD   Assessment & Plan: Visit Diagnoses:  1. Presence of right artificial hip joint   2. Right hip pain   3. Acute right-sided low back pain with right-sided sciatica     Plan: Iliotibial band stretching exercises reviewed with the patient. We'll see her back in 2 weeks to see how the injection did for her. Questions encouraged and answered.  Follow-Up Instructions: Return in about 2 weeks (around 09/07/2017).   Orders:  Orders Placed This Encounter  Procedures  . XR Lumbar Spine 2-3 Views  . XR HIP UNILAT W OR W/O PELVIS 1V RIGHT   No orders of the defined types were placed in this encounter.     Procedures: No procedures performed   Clinical Data: No additional findings.   Subjective: Chief Complaint  Patient presents with  . Right Hip - Pain  . Lower Back - Pain    HPI Inice consent today with the right groin and lateral hip pain. States pain in the groin is intermittent with walking or lying too much. At times she feels like she can almost not her weight on the hip. She status post a right total hip arthroplasty or 2117. Did have a fall once the snow and when her but was okay afterwards. Otherwise no injury to the right hip. She has been seen in the past for her back and had an MRI late last fall they didn't show mild L4 foraminal narrowing and mild right L5 foraminal stenosis with mild disc bulge. At that time recommended that she undergo an epidural steroid injection she did not do this. She does have some low back pain at times. Numbness to her right lower leg. Review of Systems Chest pain shortness breath fevers chills.  Objective: Vital Signs: There were no vitals taken for this visit.  Physical Exam  Constitutional: She is  oriented to person, place, and time. She appears well-developed and well-nourished. No distress.  Cardiovascular: Intact distal pulses.   Neurological: She is alert and oriented to person, place, and time.  Skin: She is not diaphoretic.  Psychiatric: She has a normal mood and affect.    Ortho Exam Bilateral hips good range of motion without pain. Tenderness right trochanteric region. Minimal tenderness left trochanteric region. Specialty Comments:  No specialty comments available.  Imaging: Xr Hip Unilat W Or W/o Pelvis 1v Right  Result Date: 08/24/2017 AP pelvis and lateral view of the right hip: No acute fracture. Total hip arthroplasty components all well seated. No evidence of hardware failure. No bony abnormalities otherwise.  Xr Lumbar Spine 2-3 Views  Result Date: 08/24/2017 Lumbar spine AP lateral views: No acute fractures. Disc space well-maintained. Normal lordotic curvature. No spondylolisthesis. No bony abnormalities.    PMFS History: Patient Active Problem List   Diagnosis Date Noted  . Presence of right artificial hip joint 08/24/2017  . Right hip pain 08/24/2017  . Acute right-sided low back pain with right-sided sciatica 08/24/2017  . Complex regional pain syndrome 02/03/2017  . Constipation 02/03/2017  . Irregular menstrual cycle 02/03/2017  . 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 Avascular necrosis of  bone of right hip ([place]) 04/04/2016  . 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 Avascular necrosis of bone of left hip ([place]) 12/14/2015  . 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 Overweight (BMI 25.0-29.9) 04/20/2015  . Seasonal allergies 03/20/2015  . Menorrhagia 03/19/2015  . Back pain of lumbar region with sciatica 02/12/2015  . Primary adrenal insufficiency ([place]) 01/03/2015  . Laceration of ankle, right 11/18/2012  . 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 Arthralgia 05/20/2012  . HIV (human immunodeficiency virus infection) ([place])  03/16/2012  . 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 Iron deficiency anemia 03/11/2012  . Reflux esophagitis 03/11/2012   Past Medical History:  Diagnosis Date  . 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 Adrenal insufficiency ([place])   . Anemia of chronic disease 03/11/2012  . Bell's palsy 08/26/2013  . 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 Chronic back pain   . Chronic leg pain    bilateral knees, ankles  . Fatigue   . 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 HIV (human immunodeficiency virus infection) ([place]) 02/2012  . Laceration of ankle, right 11/18/2012  . Lumbar radiculopathy   . Pelvic pain   . Reflux esophagitis 03/11/2012  . Tuberculosis   . 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 Vertigo     Family History  Problem Relation Age of Onset  . Heart disease Father        Vague not clearly cardiac    Past Surgical History:  Procedure Laterality Date  . APPENDECTOMY  ~ 2000  . DILATION AND CURETTAGE OF UTERUS  2008  . ESOPHAGOGASTRODUODENOSCOPY  03/11/2012   Procedure: ESOPHAGOGASTRODUODENOSCOPY (EGD);  Surgeon: Lafayette Dragon, MD;  Location: [facility] ENDOSCOPY;  Service: Endoscopy;  Laterality: N/A;  . ESOPHAGOGASTRODUODENOSCOPY N/A 03/07/2014   Procedure: ESOPHAGOGASTRODUODENOSCOPY (EGD);  Surgeon: Gatha Mayer, MD;  Location: [facility] ENDOSCOPY;  Service: Endoscopy;  Laterality: N/A;  . LUNG BIOPSY  02/2012  . TOTAL HIP ARTHROPLASTY Left 12/14/2015   Procedure: LEFT TOTAL HIP ARTHROPLASTY ANTERIOR APPROACH;  Surgeon: Mcarthur Rossetti, MD;  Location: [facility] ORS;  Service: Orthopedics;  Laterality: Left;  . TOTAL HIP ARTHROPLASTY Right 04/04/2016   Procedure: RIGHT TOTAL HIP ARTHROPLASTY ANTERIOR APPROACH;  Surgeon: Mcarthur Rossetti, MD;  Location: [facility] ORS;  Service: Orthopedics;  Laterality: Right;   Social History   Occupational History  . Not on file.   Social History Main Topics  . Smoking status: Never Smoker  . Smokeless tobacco: Never Used  . Alcohol use  0.0 oz/week     Comment: socially  . Drug use: No  . Sexual activity: Not Currently    Partners: Male    Birth control/ protection: None     Comment: pt. given condoms

## 2017-09-01 ENCOUNTER — Other Ambulatory Visit: Payer: Self-pay | Admitting: Pharmacist

## 2017-09-01 ENCOUNTER — Ambulatory Visit (INDEPENDENT_AMBULATORY_CARE_PROVIDER_SITE_OTHER): Payer: Medicaid Other | Admitting: Internal Medicine

## 2017-09-01 ENCOUNTER — Encounter: Payer: Self-pay | Admitting: Internal Medicine

## 2017-09-01 VITALS — BP 113/75 | HR 83 | Temp 98.5°F | Wt 165.0 lb

## 2017-09-01 DIAGNOSIS — Z23 Encounter for immunization: Secondary | ICD-10-CM | POA: Diagnosis not present

## 2017-09-01 DIAGNOSIS — B20 Human immunodeficiency virus [HIV] disease: Secondary | ICD-10-CM

## 2017-09-01 DIAGNOSIS — R42 Dizziness and giddiness: Secondary | ICD-10-CM

## 2017-09-01 NOTE — Progress Notes (Signed)
Patient ID: Kelsey Rodriguez, female   DOB: 09/07/1991, 26 y.o.   MRN: [id number]  HPI  26yo F with poorly controlled hiv disease, descovy/DRVc, cd4 count of 290/VL 198,000 in April. Restarted her hiv medications during her trip to [place], she was without meds for 2 wks but now back on her medications routinely. She got married to her ex boyfriend while in [place], she is trying to apply for his green card, he resides in [place]. She is had her last LMP on 9/4.  Saw ortho last week for hip pain despite having bilateral THA for AVN and recommended iliotibial band stretching exercises  Outpatient Encounter Prescriptions as of 09/01/2017  Medication Sig  . amitriptyline (ELAVIL) 25 MG tablet 1/2 pill each bedtime x 1 week, then 1 pill nightly x 1 week, then 1 1/2 pills nightly x 1 week, then 2 pills nightly thereafter.  . darunavir-cobicistat (PREZCOBIX) 800-150 MG tablet Take 1 tablet by mouth daily with supper.  [name] emtricitabine-tenofovir AF (DESCOVY) 200-25 MG tablet Take 1 tablet by mouth daily.  . ondansetron (ZOFRAN ODT) 8 MG disintegrating tablet Take 1 tablet (8 mg total) by mouth every 8 (eight) hours as needed for nausea or vomiting.  . polyethylene glycol powder (GLYCOLAX/MIRALAX) powder Take 17 g by mouth 2 (two) times daily as needed.  . promethazine (PHENERGAN) 12.5 MG tablet Take 1 tablet (12.5 mg total) by mouth every 8 (eight) hours as needed for nausea or vomiting.  . meclizine (ANTIVERT) 32 MG tablet Take 1 tablet (32 mg total) by mouth 3 (three) times daily as needed. If needed for "room spinning" dizziness (Patient not taking: Reported on 09/01/2017)   No facility-administered encounter medications on file as of 09/01/2017.      Patient Active Problem List   Diagnosis Date Noted  . Presence of right artificial hip joint 08/24/2017  . Right hip pain 08/24/2017  . Acute right-sided low back pain with right-sided sciatica 08/24/2017  . Complex regional pain  syndrome 02/03/2017  . Constipation 02/03/2017  . Irregular menstrual cycle 02/03/2017  . 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 Avascular necrosis of bone of right hip ([place]) 04/04/2016  . 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 Avascular necrosis of bone of left hip ([place]) 12/14/2015  . 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 Overweight (BMI 25.0-29.9) 04/20/2015  . Seasonal allergies 03/20/2015  . Menorrhagia 03/19/2015  . Back pain of lumbar region with sciatica 02/12/2015  . Primary adrenal insufficiency ([place]) 01/03/2015  . Laceration of ankle, right 11/18/2012  . 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 Arthralgia 05/20/2012  . HIV (human immunodeficiency virus infection) ([place]) 03/16/2012  . 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 Iron deficiency anemia 03/11/2012  . Reflux esophagitis 03/11/2012     Health Maintenance Due  Topic Date Due  . INFLUENZA VACCINE  07/15/2017     Review of Systems + episodes of vertigo at work that was brief, non sustained Physical Exam   BP 113/75   Pulse 83   Temp 98.5 F (36.9 C) (Oral)   Wt 165 lb (74.8 kg)   LMP 08/18/2017 (Approximate)   BMI 27.46 kg/m   Physical Exam  Constitutional:  oriented to person, place, and time. appears well-developed and well-nourished. No distress.  HENT: [place]/AT, PERRLA, no scleral icterus Mouth/Throat: Oropharynx is clear and moist. No oropharyngeal exudate.  Cardiovascular: Normal rate, regular rhythm and normal heart sounds. Exam reveals no gallop and  no friction rub.  No murmur heard.  Pulmonary/Chest: Effort normal and breath sounds normal. No respiratory distress.  has no wheezes.  Neck = supple, no nuchal rigidity Abdominal: Soft. Bowel sounds are normal.  exhibits no distension. There is no tenderness.  Lymphadenopathy: no cervical adenopathy. No axillary adenopathy Neurological: alert and oriented to person, place, and time.  Skin: Skin is warm and dry. No rash  noted. No erythema.  Psychiatric: a normal mood and affect.  behavior is normal.   Lab Results  Component Value Date   CD4TCELL 10 (L) 04/13/2017   Lab Results  Component Value Date   CD4TABS 290 (L) 04/13/2017   CD4TABS 550 12/25/2016   CD4TABS 650 09/04/2016   Lab Results  Component Value Date   HIV1RNAQUANT 198,000 (H) 04/13/2017   Lab Results  Component Value Date   HEPBSAB POS (A) 09/13/2015   Lab Results  Component Value Date   LABRPR NON REAC 09/04/2016    CBC Lab Results  Component Value Date   WBC 6.3 12/25/2016   RBC 3.90 12/25/2016   HGB 11.5 (L) 12/25/2016   HCT 35.2 12/25/2016   PLT 132 (L) 12/25/2016   MCV 90.3 12/25/2016   MCH 29.5 12/25/2016   MCHC 32.7 12/25/2016   RDW 17.3 (H) 12/25/2016   LYMPHSABS 3,591 12/25/2016   MONOABS 630 12/25/2016   EOSABS 189 12/25/2016    BMET Lab Results  Component Value Date   NA 139 12/25/2016   K 4.1 12/25/2016   CL 105 12/25/2016   CO2 27 12/25/2016   GLUCOSE 79 12/25/2016   BUN 8 12/25/2016   CREATININE 0.77 12/25/2016   CALCIUM 9.5 12/25/2016   GFRNONAA >60 09/25/2016   GFRAA >60 09/25/2016      Assessment and Plan   hiv disease= will check labs to see if she is nearly undetectable. Still concern about her adherence. Will also check for sti.   Vertigo = give exercises to hep mitigate symptoms.   Health maintenance = gave flu shot  Addendum = RPR +, will have her come in to meet with pharmacy to adherence counseling and PCN x 1

## 2017-09-02 ENCOUNTER — Telehealth: Payer: Self-pay | Admitting: Pharmacist

## 2017-09-02 LAB — T-HELPER CELL (CD4) - (RCID CLINIC ONLY)
CD4 % Helper T Cell: 15 % — ABNORMAL LOW (ref 33–55)
CD4 T Cell Abs: 310 /uL — ABNORMAL LOW (ref 400–2700)

## 2017-09-02 NOTE — Telephone Encounter (Signed)
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  Per Dr. Baxter Flattery, she needs one dose of Bicillin. She will come in tomorrow morning.

## 2017-09-03 ENCOUNTER — Ambulatory Visit: Payer: Medicaid Other | Admitting: *Deleted

## 2017-09-03 DIAGNOSIS — A539 Syphilis, unspecified: Secondary | ICD-10-CM

## 2017-09-03 LAB — COMPLETE METABOLIC PANEL WITH GFR
AG Ratio: 1.2 (calc) (ref 1.0–2.5)
ALT: 10 U/L (ref 6–29)
AST: 14 U/L (ref 10–30)
Albumin: 4.3 g/dL (ref 3.6–5.1)
Alkaline phosphatase (APISO): 81 U/L (ref 33–115)
BUN: 8 mg/dL (ref 7–25)
CO2: 24 mmol/L (ref 20–32)
Calcium: 8.9 mg/dL (ref 8.6–10.2)
Chloride: 106 mmol/L (ref 98–110)
Creat: 0.73 mg/dL (ref 0.50–1.10)
GFR, Est African American: 130 mL/min/{1.73_m2} (ref >=60)
GFR, Est Non African American: 112 mL/min/{1.73_m2} (ref >=60)
Globulin: 3.5 g/dL (calc) (ref 1.9–3.7)
Glucose, Bld: 76 mg/dL (ref 65–99)
Potassium: 4 mmol/L (ref 3.5–5.3)
Sodium: 136 mmol/L (ref 135–146)
Total Bilirubin: 0.3 mg/dL (ref 0.2–1.2)
Total Protein: 7.8 g/dL (ref 6.1–8.1)

## 2017-09-03 LAB — CBC WITH DIFFERENTIAL/PLATELET
Basophils Absolute: 31 cells/uL (ref 0–200)
Basophils Relative: 0.7 %
Eosinophils Absolute: 70 cells/uL (ref 15–500)
Eosinophils Relative: 1.6 %
HCT: 33.4 % — ABNORMAL LOW (ref 35.0–45.0)
Hemoglobin: 11 g/dL — ABNORMAL LOW (ref 11.7–15.5)
Lymphs Abs: 1857 cells/uL (ref 850–3900)
MCH: 28.9 pg (ref 27.0–33.0)
MCHC: 32.9 g/dL (ref 32.0–36.0)
MCV: 87.7 fL (ref 80.0–100.0)
MPV: 11.6 fL (ref 7.5–12.5)
Monocytes Relative: 13.4 %
Neutro Abs: 1852 cells/uL (ref 1500–7800)
Neutrophils Relative %: 42.1 %
Platelets: 219 10*3/uL (ref 140–400)
RBC: 3.81 10*6/uL (ref 3.80–5.10)
RDW: 17 % — ABNORMAL HIGH (ref 11.0–15.0)
Total Lymphocyte: 42.2 %
WBC mixed population: 590 cells/uL (ref 200–950)
WBC: 4.4 10*3/uL (ref 3.8–10.8)

## 2017-09-03 LAB — HIV-1 RNA QUANT-NO REFLEX-BLD
HIV 1 RNA Quant: 14700 copies/mL — ABNORMAL HIGH
HIV-1 RNA Quant, Log: 4.17 Log copies/mL — ABNORMAL HIGH

## 2017-09-03 LAB — RPR TITER: RPR Titer: 1:16 {titer} — ABNORMAL HIGH

## 2017-09-03 LAB — FLUORESCENT TREPONEMAL AB(FTA)-IGG-BLD: Fluorescent Treponemal ABS: NONREACTIVE

## 2017-09-03 LAB — RPR: RPR Ser Ql: REACTIVE — AB

## 2017-09-03 MED ORDER — PENICILLIN G BENZATHINE 1200000 UNIT/2ML IM SUSP
1.2000 10*6.[IU] | Freq: Once | INTRAMUSCULAR | Status: AC
  Administered 2017-09-03: 1.2 10*6.[IU] via INTRAMUSCULAR

## 2017-09-07 ENCOUNTER — Ambulatory Visit (INDEPENDENT_AMBULATORY_CARE_PROVIDER_SITE_OTHER): Payer: Medicaid Other | Admitting: Physician Assistant

## 2017-09-14 ENCOUNTER — Ambulatory Visit (INDEPENDENT_AMBULATORY_CARE_PROVIDER_SITE_OTHER): Payer: Medicaid Other | Admitting: Obstetrics & Gynecology

## 2017-09-14 ENCOUNTER — Encounter: Payer: Self-pay | Admitting: Obstetrics & Gynecology

## 2017-09-14 VITALS — BP 120/81 | HR 77 | Ht 65.0 in | Wt 163.0 lb

## 2017-09-14 DIAGNOSIS — R87610 Atypical squamous cells of undetermined significance on cytologic smear of cervix (ASC-US): Secondary | ICD-10-CM

## 2017-09-14 DIAGNOSIS — R87810 Cervical high risk human papillomavirus (HPV) DNA test positive: Secondary | ICD-10-CM | POA: Diagnosis not present

## 2017-09-14 DIAGNOSIS — N938 Other specified abnormal uterine and vaginal bleeding: Secondary | ICD-10-CM | POA: Diagnosis present

## 2017-09-14 HISTORY — DX: Atypical squamous cells of undetermined significance on cytologic smear of cervix (ASC-US): R87.610

## 2017-09-14 MED ORDER — MISOPROSTOL 200 MCG PO TABS: ORAL_TABLET | ORAL | 0 refills | Status: DC

## 2017-09-14 NOTE — Progress Notes (Addendum)
   Subjective:    Patient ID: Kelsey Rodriguez, female    DOB: 11-Nov-1991, 26 y.o.   MRN: [id number]  HPI 26 yo AA P0 here today with the issues of dyspareunia for years (Husband lives in [place] and she only sees him about 2 times per year). She reports that her periods are irregular, sometimes she will skip them and sometimes she will bleed very heavy and long with clots. She had an u/s 5/18 that showed a hemorrhagic cyst. They rec'd 12 week follow up. The uterus was normal.  Review of Systems She stopped depo provera about a year ago, unprotected sex with husband. 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 Carlyle Basques, will be seeing her soon.    Objective:   Physical Exam Breathing, conversing, and ambulating normally Well nourished, well hydrated black female, no apparent distress     Assessment & Plan:  ASCUS + HR HPV pap 2017- schedule colpo DUB- recheck u/s and plan for [facility] at next visit Pretreat with cytotec (SHE WILL NOT BE HAVING SEX UNTIL SHE SEES HER HUSBAND IN December) Rec MVI daily as she would like a child in the future

## 2017-09-21 ENCOUNTER — Ambulatory Visit (HOSPITAL_COMMUNITY)
Admission: RE | Admit: 2017-09-21 | Discharge: 2017-09-21 | Disposition: A | Discharge status: Home / Self Care | Payer: Medicaid Other | Source: Ambulatory Visit | Attending: Obstetrics & Gynecology | Admitting: Obstetrics & Gynecology

## 2017-09-21 ENCOUNTER — Encounter: Payer: Self-pay | Admitting: Obstetrics & Gynecology

## 2017-09-21 DIAGNOSIS — N83202 Unspecified ovarian cyst, left side: Secondary | ICD-10-CM | POA: Insufficient documentation

## 2017-09-21 DIAGNOSIS — N938 Other specified abnormal uterine and vaginal bleeding: Secondary | ICD-10-CM | POA: Diagnosis present

## 2017-09-23 ENCOUNTER — Ambulatory Visit: Payer: Medicaid Other | Admitting: Adult Health

## 2017-09-24 ENCOUNTER — Encounter: Payer: Self-pay | Admitting: Neurology

## 2017-09-28 ENCOUNTER — Ambulatory Visit (INDEPENDENT_AMBULATORY_CARE_PROVIDER_SITE_OTHER): Payer: Medicaid Other | Admitting: Obstetrics & Gynecology

## 2017-09-28 ENCOUNTER — Encounter: Payer: Self-pay | Admitting: Obstetrics & Gynecology

## 2017-09-28 ENCOUNTER — Other Ambulatory Visit (HOSPITAL_COMMUNITY)
Admission: RE | Admit: 2017-09-28 | Discharge: 2017-09-28 | Disposition: A | Discharge status: Home / Self Care | Payer: Medicaid Other | Source: Ambulatory Visit | Attending: Obstetrics & Gynecology | Admitting: Obstetrics & Gynecology

## 2017-09-28 VITALS — BP 124/77 | HR 70 | Ht 65.0 in | Wt 168.0 lb

## 2017-09-28 DIAGNOSIS — N938 Other specified abnormal uterine and vaginal bleeding: Secondary | ICD-10-CM

## 2017-09-28 DIAGNOSIS — R87610 Atypical squamous cells of undetermined significance on cytologic smear of cervix (ASC-US): Secondary | ICD-10-CM | POA: Diagnosis present

## 2017-09-28 DIAGNOSIS — Z3202 Encounter for pregnancy test, result negative: Secondary | ICD-10-CM | POA: Diagnosis not present

## 2017-09-28 DIAGNOSIS — R87810 Cervical high risk human papillomavirus (HPV) DNA test positive: Secondary | ICD-10-CM | POA: Diagnosis not present

## 2017-09-28 LAB — POCT PREGNANCY, URINE: Preg Test, Ur: NEGATIVE

## 2017-09-28 NOTE — Addendum Note (Signed)
Addended by: Emily Filbert on: 09/28/2017 09:31 AM   Modules accepted: Orders

## 2017-09-28 NOTE — Progress Notes (Signed)
   Subjective:    Patient ID: Kamorah Nevils, female    DOB: 08-Oct-1991, 26 y.o.   MRN: [id number]  HPI 26 yo MAA P0 here for a colpo due to pap ASCUS +HR HPV and a EMBX due to DUB.   Review of Systems She has HIV. She would like a pregnancy.    Objective:   Physical Exam Breathing, conversing, and ambulating normally UPT negative, consent signed, time out done Cervix prepped with acetic acid. Transformation zone seen in its entirety. Colpo adequate. Normal colpo ECC obtained.  Cervix prepped with betadine and grasped with a single tooth tenaculum Uterus sounded to 9 cm Pipelle used for 2 passes with a moderate amount of tissue obtained. She tolerated the procedure well.      Assessment & Plan:  DUB- await pathology ASCUS + HR HPV pap- normal colpo. If ECC is negative, then pap with cotesting in a year.

## 2017-10-15 ENCOUNTER — Encounter: Payer: Self-pay | Admitting: Obstetrics & Gynecology

## 2017-10-20 ENCOUNTER — Telehealth: Payer: Self-pay | Admitting: Pharmacist

## 2017-10-20 ENCOUNTER — Encounter: Payer: Self-pay | Admitting: Internal Medicine

## 2017-10-20 NOTE — Telephone Encounter (Signed)
Spoke to [facility] regarding her medication adherence and management.  She missed several doses last month of her HIV medications.  I asked her how she did this month and she states "better but I still miss some days". She is not really motivated to take her medications she says.  I've been working with her for months trying to help her find solutions and help her take her medications regularly.  We initially came up with a plan that she would not miss any doses between now and her appointment with Dr. Baxter Flattery next month. Then, when the pharmacy tried to refill her medications, it states that her Medicaid is no longer active.  She told me there should be no problems and will call me back with her Medicaid number in case we have the wrong one on file.  If not, she will have to come in ASAP to get assistance and/or call Medicaid ASAP to get it reinstated.  She is supposed to call me back today and if she doesn't, will call her first thing tomorrow morning.

## 2017-10-21 ENCOUNTER — Ambulatory Visit (INDEPENDENT_AMBULATORY_CARE_PROVIDER_SITE_OTHER): Payer: Medicaid Other | Admitting: Orthopaedic Surgery

## 2017-10-21 ENCOUNTER — Other Ambulatory Visit: Payer: Self-pay | Admitting: Internal Medicine

## 2017-10-21 ENCOUNTER — Encounter (INDEPENDENT_AMBULATORY_CARE_PROVIDER_SITE_OTHER): Payer: Self-pay | Admitting: Orthopaedic Surgery

## 2017-10-21 ENCOUNTER — Other Ambulatory Visit (INDEPENDENT_AMBULATORY_CARE_PROVIDER_SITE_OTHER): Payer: Self-pay

## 2017-10-21 DIAGNOSIS — Z96641 Presence of right artificial hip joint: Secondary | ICD-10-CM | POA: Diagnosis not present

## 2017-10-21 DIAGNOSIS — M5441 Lumbago with sciatica, right side: Secondary | ICD-10-CM

## 2017-10-21 DIAGNOSIS — G8929 Other chronic pain: Secondary | ICD-10-CM

## 2017-10-21 DIAGNOSIS — Z96642 Presence of left artificial hip joint: Secondary | ICD-10-CM | POA: Diagnosis not present

## 2017-10-21 DIAGNOSIS — M545 Low back pain, unspecified: Secondary | ICD-10-CM | POA: Insufficient documentation

## 2017-10-21 DIAGNOSIS — R079 Chest pain, unspecified: Secondary | ICD-10-CM

## 2017-10-21 DIAGNOSIS — R0789 Other chest pain: Principal | ICD-10-CM

## 2017-10-21 DIAGNOSIS — M25551 Pain in right hip: Secondary | ICD-10-CM

## 2017-10-21 NOTE — Telephone Encounter (Signed)
We got everything figured out with [employer].  Her last name and Medicaid number changed.  We were able to fill her medications and will mail them out today. Hopefully she will not miss any doses between now and when she sees Dr. Baxter Flattery in December.

## 2017-10-21 NOTE — Progress Notes (Signed)
The patient continues to follow-up with low back pain that radiates into her right thigh.  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  She points her low back source of her pain as well.  She does have a history of bilateral hip replacements and we last placed the right hip in April 2017.  X-rays in September of her hips and her low back were unremarkable.  The hip replacements themselves on looking at the films today show well-seated implants with no evidence of loosening or complicating features.  The alignment of her lumbar spine also appeared normal with well-maintained disc heights and disc spaces.  On exam of both hips I can easily put both hips through full internal/external rotation with no pain at all around the groin of the trochanteric area of either hip.  Her pain seems to be more low back to the right side.  She has good flexion-extension lumbar spine but it can be painful to her.  She has negative straight leg raise.  At this point I do feel she would benefit from formal physical therapy to work on core strengthening as well as other modalities  such as McKenzie exercises and any other modalities per therapist to decrease her low back pain and right-sided radicular symptoms.  They can also work on hip strengthening exercises.  We will see her back in 6 weeks to see if therapy has helped.

## 2017-10-26 ENCOUNTER — Encounter: Payer: Self-pay | Admitting: Internal Medicine

## 2017-10-27 ENCOUNTER — Ambulatory Visit: Payer: Medicaid Other | Attending: Orthopaedic Surgery | Admitting: Physical Therapy

## 2017-10-27 ENCOUNTER — Encounter: Payer: Self-pay | Admitting: Physical Therapy

## 2017-10-27 ENCOUNTER — Ambulatory Visit
Admission: RE | Admit: 2017-10-27 | Discharge: 2017-10-27 | Disposition: A | Discharge status: Home / Self Care | Payer: Medicaid Other | Source: Ambulatory Visit | Attending: Internal Medicine | Admitting: Internal Medicine

## 2017-10-27 DIAGNOSIS — R262 Difficulty in walking, not elsewhere classified: Secondary | ICD-10-CM | POA: Insufficient documentation

## 2017-10-27 DIAGNOSIS — M25551 Pain in right hip: Secondary | ICD-10-CM | POA: Diagnosis present

## 2017-10-27 DIAGNOSIS — R0789 Other chest pain: Principal | ICD-10-CM

## 2017-10-27 DIAGNOSIS — M25651 Stiffness of right hip, not elsewhere classified: Secondary | ICD-10-CM

## 2017-10-27 DIAGNOSIS — R079 Chest pain, unspecified: Secondary | ICD-10-CM

## 2017-10-27 NOTE — Therapy (Signed)
[place], [place], [address] Phone: [phone]   Fax:  [phone]  Physical Therapy Evaluation  Patient Details  Name: Kelsey Rodriguez MRN: [id number] Date of Birth: [birth year] Referring Provider: Dr Jean Rosenthal    Encounter Date: 10/27/2017  PT End of Session - 10/27/17 1508    Visit Number  1    Number of Visits  4    Date for PT Re-Evaluation  12/08/17    Authorization Type  will submit for 3 visits for medicaid. Re-submit if patient is making progress     PT Start Time  [phone] Patient was 11 minutes late for her appointment     PT Stop Time  0845    PT Time Calculation (min)  34 min    Activity Tolerance  Patient tolerated treatment well    Behavior During Therapy  [facility] for tasks assessed/performed       Past Medical History:  Diagnosis Date  . 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 Adrenal insufficiency ([place])   . Anemia of chronic disease 03/11/2012  . Bell's palsy 08/26/2013  . 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 Chronic back pain   . Chronic leg pain    bilateral knees, ankles  . Fatigue   . 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 HIV (human immunodeficiency virus infection) ([place]) 02/2012  . Laceration of ankle, right 11/18/2012  . Lumbar radiculopathy   . Pelvic pain   . Reflux esophagitis 03/11/2012  . Tuberculosis   . 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 Vertigo     Past Surgical History:  Procedure Laterality Date  . APPENDECTOMY  ~ 2000  . DILATION AND CURETTAGE OF UTERUS  2008  . LUNG BIOPSY  02/2012    There were no vitals filed for this visit.   Subjective Assessment - 10/27/17 0816    Subjective  Patient has a long history of low back pain. She had her right hip replaced last year and her other hip the year before that. She continues to have right hip and low back pain. The pain is worse when she stands. Her MRI reveals a L5 S1 disc buldge. 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 She has hads a heacahe and back pain since being diagnossed with meningitis on 10/10.     Pertinent History  meningitis, HIV, Bells palsy; recent lung Biopsy; Bilateral hip replacements     How long can you sit comfortably?  becomes stiff over an hour     How long can you stand comfortably?  < 10 minutes     How long can you walk comfortably?  limited community distances with pain     Diagnostic tests  MRI 2017: L5 S1 mild disc buldge     Currently in Pain?  Yes    Pain Score  4  Pain can reeach a 10/10     Pain Location  Hip    Pain Orientation  Right    Pain Descriptors / Indicators  Aching    Pain Type  Chronic pain    Pain Radiating Towards  Numbness into the right leg when she stands in anterior thigh into her knee     Pain Onset  More than a month ago    Pain Frequency  Constant    Aggravating Factors   Standing; walking     Pain Relieving Factors  rest     Effect of Pain on Daily Activities  difficulty perfroming work and daily tasks     Multiple Pain Sites  Yes    Pain Score  4    Pain Location  Back    Pain Orientation  Mid;Lower    Pain Descriptors / Indicators  Aching    Pain Type  Chronic pain    Pain Onset  More than a month ago    Pain Frequency  Intermittent    Aggravating Factors   standing, walking     Pain Relieving Factors  rest     Effect of Pain on Daily Activities  difficulty perfroming ADL's          [facility] PT Assessment - 10/27/17 0001      Assessment   Medical Diagnosis  right hip pain, Lower back pain     Referring Provider  Dr Jean Rosenthal     Onset Date/Surgical Date  -- For several years     Hand Dominance  Right    Next MD Visit  None scheduled     Prior Therapy  In her house for her right hip       Precautions   Precautions  None      Restrictions   Weight Bearing Restrictions  No      Balance Screen   Has the patient fallen in the past 6 months  No    Has the patient had a decrease in activity  level because of a fear of falling?   No    Is the patient reluctant to leave their home because of a fear of falling?   No      Home Environment   Additional Comments  1 step in the house       Prior Function   Level of Independence  Independent    Vocation  Full time employment    Vocation Requirements  Works as a Quarry manager     Leisure  Used to go to [employer]   Overall Cognitive Status  Within Functional Limits for tasks assessed    Attention  Focused    Focused Attention  Appears intact    Memory  Appears intact    Awareness  Appears intact    Problem Solving  Appears intact      Observation/Other Assessments   Focus on Therapeutic Outcomes (FOTO)   Medicaid       Sensation   Light Touch  Appears Intact    Additional Comments  Patient reports pain radiating into her right thigh at ttimes       Coordination   Gross Motor Movements are Fluid and Coordinated  Yes    Fine Motor Movements are Fluid and Coordinated  Yes      Posture/Postural Control   Posture Comments  Rounded shoulders; forward head; hyper lordodic      ROM / Strength   AROM / PROM / Strength  AROM;PROM;Strength      AROM   Overall AROM Comments  tightness and pain with end range hip flexion on the right; full motion on the left     AROM Assessment Site  Lumbar    Lumbar Flexion  limited 50 % with pain     Lumbar Extension  Limited 50 % with pain     Lumbar - Right Side Bend  Pain     Lumbar - Left Side Bend  No pain     Lumbar - Right Rotation  Pain  PROM   Overall PROM Comments  Pain with paissive hip flexion at 100 degrees on the right       Strength   Overall Strength Comments  Patient rpeorte dfoot drop after right hip replacement  but none noted at this time. 5/5 gross anklle strength     Strength Assessment Site  Hip;Knee;Ankle    Right/Left Hip  Right;Left    Right Hip Flexion  3+/5    Right Hip Extension  3/5    Right Hip ABduction  4/5    Right Hip ADduction  4+/5    Left  Hip Flexion  4/5    Left Hip Extension  4/5    Left Hip ABduction  4/5    Left Hip ADduction  4+/5    Right/Left Knee  Right;Left    Right Knee Flexion  4+/5    Right Knee Extension  5/5      Flexibility   Soft Tissue Assessment /Muscle Length  yes    Hamstrings  90/90 hamtring 43 degrees right 36 degrees left       Palpation   Palpation comment  tenderness to palpation in the lateral hip; spasming in bilateral lumbar paraspinals       Ambulation/Gait   Gait Comments  Right knee valgus with gait; Decreased right single leg stance time             Objective measurements completed on examination: See above findings.      [facility] PT Treatment/Exercise - 10/27/17 0001      Exercises   Exercises  Lumbar      Lumbar Exercises: Stretches   Active Hamstring Stretch Limitations  seated and 90/90 with a towel moderate cuing required for proper technique     Lower Trunk Rotation Limitations  x10       Lumbar Exercises: Supine   AB Set Limitations  posterior pelvic tilt x10       Lumbar Exercises: Prone   Other Prone Lumbar Exercises  prone on elbows x3 minutes no real change in symptoms              PT Education - 10/27/17 1508    Education provided  Yes    Education Details  reviewed HEP; symptom management; importance of core strength    Person(s) Educated  Patient    Methods  Explanation;Demonstration;Tactile cues;Verbal cues    Comprehension  Tactile cues required;Verbal cues required;Need further instruction;Verbalized understanding       PT Short Term Goals - 10/27/17 1517      PT SHORT TERM GOAL #1   Title  Patient will demonstrate a good core contraction     Baseline  poor     Time  4    Period  Weeks    Status  New    Target Date  11/24/17      PT SHORT TERM GOAL #2   Title  Patient will increase gross right hip strength to 4+/5     Baseline  3+/5 hip flexion 3+/5 hip abduction     Time  4    Period  Weeks    Status  New      PT SHORT  TERM GOAL #3   Title  Patient will be independent with intial HEP     Baseline  has no HEP     Time  4    Period  Weeks    Status  New        PT  Long Term Goals - 10/27/17 1519      PT LONG TERM GOAL #1   Title  Patient will demsotrate proper lifting technique to decrease stress at work     Baseline  needs education on proper lifting technique     Time  8    Period  Weeks    Status  New    Target Date  12/22/17      PT LONG TERM GOAL #2   Title  Patient will stand for 1 hour without increased pain in order to perfrom work tasks     Baseline  < 10 minutes of standing     Time  8    Period  Weeks    Status  New    Target Date  12/22/17      PT LONG TERM GOAL #3   Title  Patient will ambulate 3000' without increased pain in order to go shopping     Time  8    Period  Weeks    Status  New    Target Date  12/22/17             Plan - 10/27/17 1509    Clinical Impression Statement  Pt is a 26 year old female with right hip pain and lumbar spine pain. Her MRI shows a buldging disc in her lower back. she has pain and stiffness with all movements of her lumbar spine. She has weakness in bolth hips. Her right hip shows sings of troachnteric bursitis. She has instability of the right hip. She would benefit from skilled therapy to improve her core stability and strength. She was given prone positioning ecxercises today to attmept to improve disc buldge and pain into her anterior thigh. She was adivsed to try porone on elbows if she is having pain after work. Therapy will advance her HEP as tolerated.     History and Personal Factors relevant to plan of care:  Bilateral hip replacements; meningitis, bells palsy, recent lung biopsy which she reports caused right shoulder pain     Clinical Presentation  Evolving    Clinical Presentation due to:  increasing pain in her hip and back     Clinical Decision Making  Moderate    PT Frequency  2x / week    PT Duration  8 weeks    PT  Treatment/Interventions  ADLs/Self Care Home Management;Cryotherapy;Electrical Stimulation;Moist Heat;Iontophoresis 4mg /ml Dexamethasone;Gait training;Stair training;Functional mobility training;Therapeutic exercise;Patient/family education;Therapeutic activities;Neuromuscular re-education;Manual techniques;Dry needling;Taping;Passive range of motion    PT Next Visit Plan  consdier soft tissue mobilization of the lumbar paraspinals; consider roll out of IT band; begin core strengthening; consider supine core contraction with march; calm shell, ball squeeze; review HEP At some point review lifting technique and transfers. Assess tolerance to prone on elbows     PT Home Exercise Plan  lateral trunk rotation; hamstring stretch; posterior pelvic tilt     Consulted and Agree with Plan of Care  Patient       Patient will benefit from skilled therapeutic intervention in order to improve the following deficits and impairments:  Abnormal gait, Pain, Postural dysfunction, Decreased activity tolerance, Decreased endurance, Decreased range of motion, Difficulty walking  Visit Diagnosis: Pain in right hip - Plan: PT plan of care cert/re-cert  Stiffness of right hip, not elsewhere classified - Plan: PT plan of care cert/re-cert  Difficulty in walking, not elsewhere classified - Plan: PT plan of care cert/re-cert     Problem List Patient Active  Problem List   Diagnosis Date Noted  . Chronic right-sided low back pain with right-sided sciatica 10/21/2017  . 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 Presence of right artificial hip joint 08/24/2017  . Right hip pain 08/24/2017  . Acute right-sided low back pain with right-sided sciatica 08/24/2017  . Complex regional pain syndrome 02/03/2017  . Constipation 02/03/2017  . Irregular menstrual cycle 02/03/2017  . 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 Avascular necrosis of bone of right hip ([place]) 04/04/2016  . Status post total  replacement of right hip 04/04/2016  . Avascular necrosis of bone of left hip ([place]) 12/14/2015  . 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 Overweight (BMI 25.0-29.9) 04/20/2015  . Seasonal allergies 03/20/2015  . Menorrhagia 03/19/2015  . Back pain of lumbar region with sciatica 02/12/2015  . Primary adrenal insufficiency ([place]) 01/03/2015  . Laceration of ankle, right 11/18/2012  . 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 Arthralgia 05/20/2012  . HIV (human immunodeficiency virus infection) ([place]) 03/16/2012  . 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 Iron deficiency anemia 03/11/2012  . Reflux esophagitis 03/11/2012    Carney Living PT DPT  10/27/2017, 3:32 PM  [facility] [address] [place], [place], [address] Phone: [phone]   Fax:  [phone]  Name: Kelsey Rodriguez MRN: [id number] Date of Birth: 1991/10/22

## 2017-11-10 ENCOUNTER — Ambulatory Visit: Payer: Medicaid Other | Admitting: Physical Therapy

## 2017-11-12 ENCOUNTER — Ambulatory Visit: Payer: Medicaid Other | Admitting: Rehabilitation

## 2017-11-12 ENCOUNTER — Encounter: Payer: Self-pay | Admitting: Rehabilitation

## 2017-11-12 DIAGNOSIS — M25551 Pain in right hip: Secondary | ICD-10-CM | POA: Diagnosis not present

## 2017-11-12 DIAGNOSIS — M25651 Stiffness of right hip, not elsewhere classified: Secondary | ICD-10-CM

## 2017-11-12 DIAGNOSIS — R262 Difficulty in walking, not elsewhere classified: Secondary | ICD-10-CM

## 2017-11-12 NOTE — Therapy (Signed)
[place], [place], [address] Phone: [phone]   Fax:  [phone]  Physical Therapy Treatment  Patient Details  Name: Kelsey Rodriguez MRN: [id number] Date of Birth: Feb 01, 1991 Referring Provider: Dr Jean Rosenthal    Encounter Date: 11/12/2017  PT End of Session - 11/12/17 1142    Visit Number  2    Number of Visits  4    Date for PT Re-Evaluation  12/08/17    PT Start Time  1117 arriving 56min late    PT Stop Time  1155    PT Time Calculation (min)  38 min    Activity Tolerance  Patient tolerated treatment well    Behavior During Therapy  [facility] for tasks assessed/performed       Past Medical History:  Diagnosis Date  . 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 Adrenal insufficiency ([place])   . Anemia of chronic disease 03/11/2012  . Bell's palsy 08/26/2013  . 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 Chronic back pain   . Chronic leg pain    bilateral knees, ankles  . Fatigue   . 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 HIV (human immunodeficiency virus infection) ([place]) 02/2012  . Laceration of ankle, right 11/18/2012  . Lumbar radiculopathy   . Pelvic pain   . Reflux esophagitis 03/11/2012  . Tuberculosis   . 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 Vertigo     Past Surgical History:  Procedure Laterality Date  . APPENDECTOMY  ~ 2000  . DILATION AND CURETTAGE OF UTERUS  2008  . ESOPHAGOGASTRODUODENOSCOPY  03/11/2012   Procedure: ESOPHAGOGASTRODUODENOSCOPY (EGD);  Surgeon: Lafayette Dragon, MD;  Location[facility] ENDOSCOPY;  Service: Endoscopy;  Laterality: N/A;  . ESOPHAGOGASTRODUODENOSCOPY N/A 03/07/2014   Procedure: ESOPHAGOGASTRODUODENOSCOPY (EGD);  Surgeon: Gatha Mayer, MD;  Location[facility] ENDOSCOPY;  Service: Endoscopy;  Laterality: N/A;  . LUNG BIOPSY  02/2012  . TOTAL HIP ARTHROPLASTY Left 12/14/2015   Procedure: LEFT TOTAL HIP ARTHROPLASTY ANTERIOR APPROACH;  Surgeon: Mcarthur Rossetti,  MD;  Location: [facility] ORS;  Service: Orthopedics;  Laterality: Left;  . TOTAL HIP ARTHROPLASTY Right 04/04/2016   Procedure: RIGHT TOTAL HIP ARTHROPLASTY ANTERIOR APPROACH;  Surgeon: Mcarthur Rossetti, MD;  Location: [facility] ORS;  Service: Orthopedics;  Laterality: Right;    There were no vitals filed for this visit.  Subjective Assessment - 11/12/17 1113    Subjective  Just came from work so the back is really hurting.  The R hip is also hurting.  Reports the POE exercises were slightly difficult due to pain lying supine and the stretch  was hard due to the bad shoulder.      Pertinent History  meningitis, HIV, Bells palsy; recent lung Biopsy; Bilateral hip replacements     Pain Score  4  up to 7/10 with bending     Pain Location  Back    Aggravating Factors   bending forward, standing, walking    Pain Relieving Factors  rest    Pain Score  4    Pain Location  Hip    Pain Orientation  Right    Aggravating Factors   standing, walking    Pain Relieving Factors  rest                      [facility] Adult PT Treatment/Exercise - 11/12/17 0001      Self-Care   Self-Care  Heat/Ice Application    Heat/Ice Application  home  heating pads what to buy and how to use      Lumbar Exercises: Stretches   Active Hamstring Stretch Limitations  seated and supine with strap 3x20" each, cueing to correct lumbar flexion in seated    Lower Trunk Rotation Limitations  x10     ITB Stretch  2 reps;20 seconds;Other (comment) with strap; R; assistance with LE by PT      Lumbar Exercises: Supine   Ab Set  10 reps;5 seconds sig education and cueing for first time    Other Supine Lumbar Exercises  TA + march x 6 each leg      Lumbar Exercises: Prone   Other Prone Lumbar Exercises  POE x 30"x3      Modalities   Modalities  Moist Heat      Moist Heat Therapy   Number Minutes Moist Heat  15 Minutes    Moist Heat Location  Lumbar Spine hooklying               PT Short Term Goals -  10/27/17 1517      PT SHORT TERM GOAL #1   Title  Patient will demonstrate a good core contraction     Baseline  poor     Time  4    Period  Weeks    Status  New    Target Date  11/24/17      PT SHORT TERM GOAL #2   Title  Patient will increase gross right hip strength to 4+/5     Baseline  3+/5 hip flexion 3+/5 hip abduction     Time  4    Period  Weeks    Status  New      PT SHORT TERM GOAL #3   Title  Patient will be independent with intial HEP     Baseline  has no HEP     Time  4    Period  Weeks    Status  New        PT Long Term Goals - 10/27/17 1519      PT LONG TERM GOAL #1   Title  Patient will demsotrate proper lifting technique to decrease stress at work     Baseline  needs education on proper lifting technique     Time  8    Period  Weeks    Status  New    Target Date  12/22/17      PT LONG TERM GOAL #2   Title  Patient will stand for 1 hour without increased pain in order to perfrom work tasks     Baseline  < 10 minutes of standing     Time  8    Period  Weeks    Status  New    Target Date  12/22/17      PT LONG TERM GOAL #3   Title  Patient will ambulate 3000' without increased pain in order to go shopping     Time  8    Period  Weeks    Status  New    Target Date  12/22/17            Plan - 11/12/17 1145    Clinical Impression Statement  Pt arrives today with continuation of low back and Rt hip pain.  Demonstrates significant LE tightness with all stretching and some fear of movement overall with stretches and LTR.  Began core reeducation today and MH trial.  manual not performed due  to pt arriving late.      PT Frequency  2x / week    PT Duration  8 weeks    PT Treatment/Interventions  ADLs/Self Care Home Management;Cryotherapy;Electrical Stimulation;Moist Heat;Iontophoresis 4mg /ml Dexamethasone;Gait training;Stair training;Functional mobility training;Therapeutic exercise;Patient/family education;Therapeutic activities;Neuromuscular  re-education;Manual techniques;Dry needling;Taping;Passive range of motion    PT Next Visit Plan  consdier soft tissue mobilization of the lumbar paraspinals; consider roll out of IT band; begin core strengthening; consider supine core contraction with march; calm shell, ball squeeze; review HEP At some point review lifting technique and transfers. Assess tolerance to prone on elbows     PT Home Exercise Plan  lateral trunk rotation; hamstring stretch; posterior pelvic tilt        Patient will benefit from skilled therapeutic intervention in order to improve the following deficits and impairments:  Abnormal gait, Pain, Postural dysfunction, Decreased activity tolerance, Decreased endurance, Decreased range of motion, Difficulty walking  Visit Diagnosis: Pain in right hip  Stiffness of right hip, not elsewhere classified  Difficulty in walking, not elsewhere classified     Problem List Patient Active Problem List   Diagnosis Date Noted  . Chronic right-sided low back pain with right-sided sciatica 10/21/2017  . 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 Presence of right artificial hip joint 08/24/2017  . Right hip pain 08/24/2017  . Acute right-sided low back pain with right-sided sciatica 08/24/2017  . Complex regional pain syndrome 02/03/2017  . Constipation 02/03/2017  . Irregular menstrual cycle 02/03/2017  . 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 Avascular necrosis of bone of right hip ([place]) 04/04/2016  . 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 Avascular necrosis of bone of left hip ([place]) 12/14/2015  . 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 Overweight (BMI 25.0-29.9) 04/20/2015  . Seasonal allergies 03/20/2015  . Menorrhagia 03/19/2015  . Back pain of lumbar region with sciatica 02/12/2015  . Primary adrenal insufficiency ([place]) 01/03/2015  . Laceration of ankle, right 11/18/2012  . 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 Arthralgia  05/20/2012  . HIV (human immunodeficiency virus infection) ([place]) 03/16/2012  . 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 Iron deficiency anemia 03/11/2012  . Reflux esophagitis 03/11/2012    Stark Bray, DPT, CMP 11/12/2017, 11:47 AM  [facility] [address] [place], [place], [address] Phone: [phone]   Fax:  [phone]  Name: Kelsey Rodriguez MRN: [id number] Date of Birth: 10-Dec-1991

## 2017-11-17 ENCOUNTER — Encounter: Payer: Self-pay | Admitting: Physical Therapy

## 2017-11-17 ENCOUNTER — Ambulatory Visit: Payer: Medicaid Other | Attending: Orthopaedic Surgery | Admitting: Physical Therapy

## 2017-11-17 ENCOUNTER — Other Ambulatory Visit: Payer: Self-pay | Admitting: Pharmacist Clinician (PhC)/ Clinical Pharmacy Specialist

## 2017-11-17 ENCOUNTER — Other Ambulatory Visit: Payer: Medicaid Other

## 2017-11-17 DIAGNOSIS — M25551 Pain in right hip: Secondary | ICD-10-CM | POA: Insufficient documentation

## 2017-11-17 DIAGNOSIS — M25651 Stiffness of right hip, not elsewhere classified: Secondary | ICD-10-CM | POA: Diagnosis present

## 2017-11-17 DIAGNOSIS — Z113 Encounter for screening for infections with a predominantly sexual mode of transmission: Secondary | ICD-10-CM

## 2017-11-17 DIAGNOSIS — R262 Difficulty in walking, not elsewhere classified: Secondary | ICD-10-CM | POA: Diagnosis present

## 2017-11-17 DIAGNOSIS — B20 Human immunodeficiency virus [HIV] disease: Secondary | ICD-10-CM

## 2017-11-17 MED ORDER — DARUN-COBIC-EMTRICIT-TENOFAF 800-150-200-10 MG PO TABS: 1.0000 | ORAL_TABLET | Freq: Every day | ORAL | 6 refills | Status: DC

## 2017-11-17 NOTE — Progress Notes (Signed)
Kelsey Rodriguez [place] going [place] the [place]. Therefore, we have to figure out again on how to get her supply while she is out of the country. Rx just filled the current regimen (Prezcobix/Descovy). We will change her ART to [place] so they can fill the new supply and she can keep on hand.

## 2017-11-17 NOTE — Therapy (Addendum)
[place], [place], [address] Phone: [phone]   Fax:  [phone]  Physical Therapy Treatment/ Discharge   Patient Details  Name: Kelsey Rodriguez MRN: [id number] Date of Birth: 01-09-1991 Referring Provider: Dr Jean Rosenthal    Encounter Date: 11/17/2017   PT End of Session - 11/17/17 0907    Visit Number  3    Number of Visits  9    Date for PT Re-Evaluation  12/08/17    Authorization Type  will submit for 3 visits for medicaid. Re-submit if patient is making progress     PT Start Time  [phone]    PT Stop Time  0930    PT Time Calculation (min)  41 min    Activity Tolerance  Patient tolerated treatment well    Behavior During Therapy  WFL for tasks assessed/performed       Past Medical History:  Diagnosis Date  . 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 Adrenal insufficiency ([place])   . Anemia of chronic disease 03/11/2012  . Bell's palsy 08/26/2013  . 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 Chronic back pain   . Chronic leg pain    bilateral knees, ankles  . Fatigue   . 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 HIV (human immunodeficiency virus infection) ([place]) 02/2012  . Laceration of ankle, right 11/18/2012  . Lumbar radiculopathy   . Pelvic pain   . Reflux esophagitis 03/11/2012  . Tuberculosis   . 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 Vertigo     Past Surgical History:  Procedure Laterality Date  . APPENDECTOMY  ~ 2000  . DILATION AND CURETTAGE OF UTERUS  2008  . ESOPHAGOGASTRODUODENOSCOPY  03/11/2012   Procedure: ESOPHAGOGASTRODUODENOSCOPY (EGD);  Surgeon: Lafayette Dragon, MD;  Location: [facility] ENDOSCOPY;  Service: Endoscopy;  Laterality: N/A;  . ESOPHAGOGASTRODUODENOSCOPY N/A 03/07/2014   Procedure: ESOPHAGOGASTRODUODENOSCOPY (EGD);  Surgeon: Gatha Mayer, MD;  Location: [facility] ENDOSCOPY;  Service: Endoscopy;  Laterality: N/A;  . LUNG BIOPSY  02/2012  . TOTAL HIP ARTHROPLASTY Left 12/14/2015    Procedure: LEFT TOTAL HIP ARTHROPLASTY ANTERIOR APPROACH;  Surgeon: Mcarthur Rossetti, MD;  Location: [facility] ORS;  Service: Orthopedics;  Laterality: Left;  . TOTAL HIP ARTHROPLASTY Right 04/04/2016   Procedure: RIGHT TOTAL HIP ARTHROPLASTY ANTERIOR APPROACH;  Surgeon: Mcarthur Rossetti, MD;  Location: [facility] ORS;  Service: Orthopedics;  Laterality: Right;    There were no vitals filed for this visit.  Subjective Assessment - 11/17/17 0900    Subjective  Patient reports her hip has been hurting her over the past few days. She halso had an onset of lower back pain. She bent to pick something up and had significant pain.     Pertinent History  meningitis, HIV, Bells palsy; recent lung Biopsy; Bilateral hip replacements     How long can you sit comfortably?  becomes stiff over an hour     How long can you stand comfortably?  < 10 minutes     How long can you walk comfortably?  limited community distances with pain     Diagnostic tests  MRI 2017: L5 S1 mild disc buldge     Currently in Pain?  Yes    Pain Score  4     Pain Location  Hip    Pain Orientation  Right    Pain Descriptors / Indicators  Aching    Pain Type  Chronic pain    Pain Onset  More than  a month ago    Pain Frequency  Constant    Aggravating Factors   bending forward; sitting, and standing     Pain Relieving Factors  rest     Effect of Pain on Daily Activities  difficulty perfroming daily tasks          [facility] PT Assessment - 11/18/17 0001      AROM   Lumbar Flexion  50% limited less pin       Strength   Right Hip Flexion  4/5    Right Hip Extension  3+/5      Flexibility   Hamstrings  right 40 left 36      Palpation   Palpation comment  tenderness to palpation in the lateral hip; spasming in bilateral lumbar paraspinals                   [facility] Adult PT Treatment/Exercise - 11/17/17 0001      Lumbar Exercises: Stretches   Active Hamstring Stretch Limitations  seated 3x30 sec     Lower Trunk  Rotation Limitations  x10       Lumbar Exercises: Supine   Ab Set  10 reps;5 seconds sig education and cueing for first time    Other Supine Lumbar Exercises  TA + march x 6 each leg    Other Supine Lumbar Exercises  ball squeeze 2x10 with abdominal breathing; clam shell;       Manual Therapy   Manual therapy comments  IT band and gluteal roll; soft tissue mobilization to lumbar spine;              PT Education - 11/17/17 0904    Education provided  Yes    Education Details  reviewed HEP, symptom management; core strengthening     Person(s) Educated  Patient    Methods  Explanation;Demonstration;Tactile cues    Comprehension  Verbalized understanding;Returned demonstration;Verbal cues required;Tactile cues required       PT Short Term Goals - 11/17/17 2039      PT SHORT TERM GOAL #1   Title  Patient will demonstrate a good core contraction     Baseline Fair   Time  4    Period  Weeks    Status  On-going      PT SHORT TERM GOAL #2   Title  Patient will increase gross right hip strength to 4+/5     Baseline  3+/5 hip flexion 3+/5 hip abduction     Time  4    Period  Weeks    Status  On-going      PT SHORT TERM GOAL #3   Title  Patient will be independent with intial HEP     Baseline  has no HEP     Time  4    Period  Weeks    Status  On-going        PT Long Term Goals - 10/27/17 1519      PT LONG TERM GOAL #1   Title  Patient will demsotrate proper lifting technique to decrease stress at work     Baseline  needs education on proper lifting technique     Time  8    Period  Weeks    Status  New    [place] Date  12/22/17      PT LONG TERM GOAL #2   Title  Patient will stand for 1 hour without increased pain in order to perfrom work tasks  Baseline  < 10 minutes of standing     Time  8    Period  Weeks    Status  New    Target Date  12/22/17      PT LONG TERM GOAL #3   Title  Patient will ambulate 3000' without increased pain in order to go shopping      Time  8    Period  Weeks    Status  New    Target Date  12/22/17            Plan - 11/17/17 0909    Clinical Impression Statement  Therapy reviewed ways to do light stretching on the plane. she will be going back to her home country for the month of Janur[place]. She would like to try to have another visit before she goes. Therapy will re-submit for more therapy. She has had improvements in strength and hip range of motion. She countinues to have pain with work. She would benefit from therapy 2x a week for 4 more weeks unless she goes on her trip.     Clinical Presentation  Evolving    Clinical Presentation due to:  increasing pain in the hip and the back     Rehab Potential  Good    PT Frequency  2x / week    PT Duration  8 weeks    PT Treatment/Interventions  ADLs/Self Care Home Management;Cryotherapy;Electrical Stimulation;Moist Heat;Iontophoresis '4mg'$ /ml Dexamethasone;Gait training;Stair training;Functional mobility training;Therapeutic exercise;Patient/family education;Therapeutic activities;Neuromuscular re-education;Manual techniques;Dry needling;Taping;Passive range of motion    PT Next Visit Plan  consdier soft tissue mobilization of the lumbar paraspinals; consider roll out of IT band; begin core strengthening; consider supine core contraction with march; calm shell, ball squeeze; review HEP At some point review lifting technique and transfers. Assess tolerance to prone on elbows     PT Home Exercise Plan  lateral trunk rotation; hamstring stretch; posterior pelvic tilt     Consulted and Agree with Plan of Care  Patient       Patient will benefit from skilled therapeutic intervention in order to improve the following deficits and impairments:  Abnormal gait, Pain, Postural dysfunction, Decreased activity tolerance, Decreased endurance, Decreased range of motion, Difficulty walking  Visit Diagnosis: Pain in right hip  Stiffness of right hip, not elsewhere  classified  Difficulty in walking, not elsewhere classified    PHYSICAL THERAPY DISCHARGE SUMMARY  Visits from Start of Care: 3  Current functional level related to goals / functional outcomes: Patient did not show for her last visit  Remaining deficits: None  Education / Equipment: HEP  Plan: Patient agrees to discharge.  Patient goals were not met. Patient is being discharged due to not returning since the last visit.  ?????      Problem List Patient Active Problem List   Diagnosis Date Noted  . Chronic right-sided low back pain with right-sided sciatica 10/21/2017  . 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 Presence of right artificial hip joint 08/24/2017  . Right hip pain 08/24/2017  . Acute right-sided low back pain with right-sided sciatica 08/24/2017  . Complex regional pain syndrome 02/03/2017  . Constipation 02/03/2017  . Irregular menstrual cycle 02/03/2017  . 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 Avascular necrosis of bone of right hip ([place]) 04/04/2016  . 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 Avascular necrosis of bone of left hip ([place]) 12/14/2015  . 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  Overweight (BMI 25.0-29.9) 04/20/2015  . Seasonal allergies 03/20/2015  . Menorrhagia 03/19/2015  . Back pain of lumbar region with sciatica 02/12/2015  . Primary adrenal insufficiency ([place]) 01/03/2015  . Laceration of ankle, right 11/18/2012  . 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 Arthralgia 05/20/2012  . HIV (human immunodeficiency virus infection) ([place]) 03/16/2012  . 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 Iron deficiency anemia 03/11/2012  . Reflux esophagitis 03/11/2012    Carney Living PT DPT  11/18/2017, 7:46 AM  [facility] [address] [place], [place], [address] Phone: [phone]   Fax:  [phone]  Name: Kelsey Rodriguez MRN: [id number] Date  of Birth: [birth year]

## 2017-11-18 ENCOUNTER — Other Ambulatory Visit: Payer: Self-pay | Admitting: Pharmacist

## 2017-11-19 LAB — HIV-1 RNA QUANT-NO REFLEX-BLD
HIV 1 RNA Quant: 373 copies/mL — ABNORMAL HIGH
HIV-1 RNA Quant, Log: 2.57 Log copies/mL — ABNORMAL HIGH

## 2017-11-19 LAB — COMPLETE METABOLIC PANEL WITH GFR
AG Ratio: 1 (calc) (ref 1.0–2.5)
ALT: 11 U/L (ref 6–29)
AST: 16 U/L (ref 10–30)
Albumin: 3.9 g/dL (ref 3.6–5.1)
Alkaline phosphatase (APISO): 72 U/L (ref 33–115)
BUN: 14 mg/dL (ref 7–25)
CO2: 28 mmol/L (ref 20–32)
Calcium: 9.5 mg/dL (ref 8.6–10.2)
Chloride: 103 mmol/L (ref 98–110)
Creat: 0.86 mg/dL (ref 0.50–1.10)
GFR, Est African American: 107 mL/min/{1.73_m2} (ref >=60)
GFR, Est Non African American: 92 mL/min/{1.73_m2} (ref >=60)
Globulin: 3.9 g/dL (calc) — ABNORMAL HIGH (ref 1.9–3.7)
Glucose, Bld: 77 mg/dL (ref 65–99)
Potassium: 4.5 mmol/L (ref 3.5–5.3)
Sodium: 137 mmol/L (ref 135–146)
Total Bilirubin: 0.3 mg/dL (ref 0.2–1.2)
Total Protein: 7.8 g/dL (ref 6.1–8.1)

## 2017-11-19 LAB — CBC WITH DIFFERENTIAL/PLATELET
Basophils Absolute: 30 cells/uL (ref 0–200)
Basophils Relative: 0.6 %
Eosinophils Absolute: 180 cells/uL (ref 15–500)
Eosinophils Relative: 3.6 %
HCT: 31.7 % — ABNORMAL LOW (ref 35.0–45.0)
Hemoglobin: 11 g/dL — ABNORMAL LOW (ref 11.7–15.5)
Lymphs Abs: 2270 cells/uL (ref 850–3900)
MCH: 30.1 pg (ref 27.0–33.0)
MCHC: 34.7 g/dL (ref 32.0–36.0)
MCV: 86.8 fL (ref 80.0–100.0)
MPV: 12 fL (ref 7.5–12.5)
Monocytes Relative: 12.8 %
Neutro Abs: 1880 cells/uL (ref 1500–7800)
Neutrophils Relative %: 37.6 %
Platelets: 167 10*3/uL (ref 140–400)
RBC: 3.65 10*6/uL — ABNORMAL LOW (ref 3.80–5.10)
RDW: 16.1 % — ABNORMAL HIGH (ref 11.0–15.0)
Total Lymphocyte: 45.4 %
WBC mixed population: 640 cells/uL (ref 200–950)
WBC: 5 10*3/uL (ref 3.8–10.8)

## 2017-11-19 LAB — RPR: RPR Ser Ql: REACTIVE — AB

## 2017-11-19 LAB — T-HELPER CELL (CD4) - (RCID CLINIC ONLY)
CD4 % Helper T Cell: 14 % — ABNORMAL LOW (ref 33–55)
CD4 T Cell Abs: 320 /uL — ABNORMAL LOW (ref 400–2700)

## 2017-11-19 LAB — RPR TITER: RPR Titer: 1:4 {titer} — ABNORMAL HIGH

## 2017-11-19 LAB — FLUORESCENT TREPONEMAL AB(FTA)-IGG-BLD: Fluorescent Treponemal ABS: NONREACTIVE

## 2017-11-20 ENCOUNTER — Other Ambulatory Visit (HOSPITAL_COMMUNITY)
Admission: RE | Admit: 2017-11-20 | Discharge: 2017-11-20 | Disposition: A | Discharge status: Home / Self Care | Payer: Medicaid Other | Source: Ambulatory Visit | Attending: Obstetrics & Gynecology | Admitting: Obstetrics & Gynecology

## 2017-11-20 ENCOUNTER — Ambulatory Visit: Payer: Medicaid Other | Admitting: Obstetrics & Gynecology

## 2017-11-20 VITALS — BP 122/78 | HR 70 | Wt 170.5 lb

## 2017-11-20 DIAGNOSIS — Z0000 Encounter for general adult medical examination without abnormal findings: Secondary | ICD-10-CM | POA: Insufficient documentation

## 2017-11-20 DIAGNOSIS — R109 Unspecified abdominal pain: Secondary | ICD-10-CM

## 2017-11-20 NOTE — Progress Notes (Signed)
Patient ID: Janisse Ghan, female   DOB: 31-Aug-1991, 26 y.o.   MRN: [id number]  No chief complaint on file.   HPI Angie Bih Herma Mering is a 26 y.o. female.  Married P0 here to disucss her EMBX results, ultrasound, and pathology from her colpo 09/28/17.  She reports that her periods are "irregular". She skips her periods some months and then she may have a 5 day period the next month. They are painful. She used depo provera last year (on it for about 2 years- still had light periods). She quit using contraception 2/18, wanting a pregnancy. But her husband lives in [place]. She saw for 3 months this summer and plans to go back this month til February 2019  She is treated for HIV and syphyllis.  HPI  Past Medical History:  Diagnosis Date  . 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 Adrenal insufficiency ([place])   . Anemia of chronic disease 03/11/2012  . Bell's palsy 08/26/2013  . 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 Chronic back pain   . Chronic leg pain    bilateral knees, ankles  . Fatigue   . 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 HIV (human immunodeficiency virus infection) ([place]) 02/2012  . Laceration of ankle, right 11/18/2012  . Lumbar radiculopathy   . Pelvic pain   . Reflux esophagitis 03/11/2012  . Tuberculosis   . 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 Vertigo     Past Surgical History:  Procedure Laterality Date  . APPENDECTOMY  ~ 2000  . DILATION AND CURETTAGE OF UTERUS  2008  . ESOPHAGOGASTRODUODENOSCOPY  03/11/2012   Procedure: ESOPHAGOGASTRODUODENOSCOPY (EGD);  Surgeon: Lafayette Dragon, MD;  Location: [facility] ENDOSCOPY;  Service: Endoscopy;  Laterality: N/A;  . ESOPHAGOGASTRODUODENOSCOPY N/A 03/07/2014   Procedure: ESOPHAGOGASTRODUODENOSCOPY (EGD);  Surgeon: Gatha Mayer, MD;  Location: [facility] ENDOSCOPY;  Service: Endoscopy;  Laterality: N/A;  . LUNG BIOPSY  02/2012  . TOTAL HIP ARTHROPLASTY Left 12/14/2015   Procedure: LEFT TOTAL HIP ARTHROPLASTY ANTERIOR APPROACH;  Surgeon:  Mcarthur Rossetti, MD;  Location: [facility] ORS;  Service: Orthopedics;  Laterality: Left;  . TOTAL HIP ARTHROPLASTY Right 04/04/2016   Procedure: RIGHT TOTAL HIP ARTHROPLASTY ANTERIOR APPROACH;  Surgeon: Mcarthur Rossetti, MD;  Location: [facility] ORS;  Service: Orthopedics;  Laterality: Right;    Family History  Problem Relation Age of Onset  . 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 Smoking status: Never Smoker  . Smokeless tobacco: Never Used  Substance Use Topics  . Alcohol use: No    Alcohol/week: 0.0 oz    Comment: socially  . Drug use: No    Allergies  Allergen Reactions  . Hydrocodone Itching and Nausea Only    Tolerates Oxycodone  . Tramadol Itching and Nausea Only    Tolerates oxycodone    Current Outpatient Medications  Medication Sig Dispense Refill  . amitriptyline (ELAVIL) 25 MG tablet 1/2 pill each bedtime x 1 week, then 1 pill nightly x 1 week, then 1 1/2 pills nightly x 1 week, then 2 pills nightly thereafter. 60 tablet 5  . Darunavir-Cobicisctat-Emtricitabine-Tenofovir Alafenamide (SYMTUZA) 800-150-200-10 MG TABS Take 1 tablet by mouth daily with breakfast. 30 tablet 6  . meclizine (ANTIVERT) 32 MG tablet Take 1 tablet (32 mg total) by mouth 3 (three) times daily as needed. If  needed for "room spinning" dizziness 30 tablet 0  . misoprostol (CYTOTEC) 200 MCG tablet Take 3 pills by mouth the night before biopsy. (Patient not taking: Reported on 10/27/2017) 3 tablet 0  . ondansetron (ZOFRAN ODT) 8 MG disintegrating tablet Take 1 tablet (8 mg total) by mouth every 8 (eight) hours as needed for nausea or vomiting. 90 tablet 3  . polyethylene glycol powder (GLYCOLAX/MIRALAX) powder Take 17 g by mouth 2 (two) times daily as needed. 119 g 1  . promethazine (PHENERGAN) 12.5 MG tablet Take 1 tablet (12.5 mg total) by mouth every 8 (eight) hours  as needed for nausea or vomiting. 90 tablet 3   No current facility-administered medications for this visit.     Review of Systems Review of Systems She reports dyspareunia, lifetime.  There were no vitals taken for this visit.  Physical Exam Physical Exam Breathing, conversing, and ambulating normally Well nourished, well hydrated Black female, no apparent distress  Data Reviewed FINDINGS: Uterus  Measurements: 7.8 x 3.5 x 4.3 cm. No fibroids or other mass visualized.  Endometrium  Thickness: 9 mm in thickness.  No focal abnormality visualized.  Right ovary  Measurements: 2.4 x 2.4 x 2.5 cm. Normal appearance/no adnexal mass.  Left ovary  Measurements: 5.7 x 2.7 x 3.6 cm. Two cystic areas noted in the left adnexa, measuring 2.6 x 2.6 x 2.0 cm and 3.9 x 3.2 x 1.8 cm. These appear simple with no internal septations or nodularity or internal blood flow. One of these may represent the previously seen left hydrosalpinx.  Other findings:  No abnormal free fluid  IMPRESSION: Two cystic areas within the left adnexa which appear benign/ simple. One of these may represent the previously seen hydrosalpinx.  Diagnosis 1. Endometrium, biopsy - SECRETORY ENDOMETRIUM - BENIGN SQUAMOUS EPITHELIUM - NO HYPERPLASIA OR MALIGNANCY IDENTIFIED 2. Endocervix, curettage - MINUTE FRAGMENT OF BENIGN SQUAMOUS AND GLANDULAR EPITHELIUM   Assessment    1)ASCUS + HR HPV pap- normal colpo and negative ECC  2)DUB with negative work up  Plan   1) Recommend pap with cotesting in a year 2) She declines OCPs 3) Rec PNVs daily 4) check for GC and CT       Emila Steinhauser C Kenleigh Toback 11/20/2017, 9:19 AM

## 2017-11-20 NOTE — Progress Notes (Signed)
I believe she said sometime in Jan.

## 2017-11-20 NOTE — Progress Notes (Signed)
Do we know how long she will be going?

## 2017-11-21 LAB — CERVICOVAGINAL ANCILLARY ONLY
Chlamydia: NEGATIVE
Neisseria Gonorrhea: NEGATIVE

## 2017-11-23 ENCOUNTER — Ambulatory Visit: Payer: Medicaid Other | Admitting: Student in an Organized Health Care Education/Training Program

## 2017-11-24 ENCOUNTER — Ambulatory Visit: Payer: Medicaid Other | Admitting: Student in an Organized Health Care Education/Training Program

## 2017-11-24 ENCOUNTER — Ambulatory Visit: Payer: Medicaid Other | Admitting: Physical Therapy

## 2017-11-27 ENCOUNTER — Ambulatory Visit: Payer: Medicaid Other | Admitting: Physical Therapy

## 2017-12-01 ENCOUNTER — Telehealth: Payer: Self-pay | Admitting: *Deleted

## 2017-12-01 ENCOUNTER — Ambulatory Visit: Payer: Medicaid Other | Admitting: Internal Medicine

## 2017-12-01 ENCOUNTER — Encounter: Payer: Medicaid Other | Admitting: Physical Therapy

## 2017-12-01 NOTE — Telephone Encounter (Signed)
Left message letting patient know about her missed visit, asked her to call back and schedule in January. Landis Gandy

## 2017-12-02 ENCOUNTER — Ambulatory Visit (INDEPENDENT_AMBULATORY_CARE_PROVIDER_SITE_OTHER): Payer: Medicaid Other | Admitting: Orthopaedic Surgery

## 2018-02-11 ENCOUNTER — Other Ambulatory Visit: Payer: Self-pay | Admitting: Pharmacist

## 2018-02-12 ENCOUNTER — Other Ambulatory Visit: Payer: Self-pay

## 2018-02-12 ENCOUNTER — Other Ambulatory Visit (HOSPITAL_COMMUNITY)
Admission: RE | Admit: 2018-02-12 | Discharge: 2018-02-12 | Disposition: A | Discharge status: Home / Self Care | Payer: Medicaid Other | Source: Ambulatory Visit | Attending: Obstetrics & Gynecology | Admitting: Obstetrics & Gynecology

## 2018-02-12 ENCOUNTER — Ambulatory Visit (INDEPENDENT_AMBULATORY_CARE_PROVIDER_SITE_OTHER): Payer: Medicaid Other | Admitting: Obstetrics & Gynecology

## 2018-02-12 ENCOUNTER — Encounter: Payer: Self-pay | Admitting: Obstetrics & Gynecology

## 2018-02-12 VITALS — BP 139/90 | HR 89 | Wt 162.9 lb

## 2018-02-12 DIAGNOSIS — B373 Candidiasis of vulva and vagina: Secondary | ICD-10-CM | POA: Diagnosis not present

## 2018-02-12 DIAGNOSIS — R102 Pelvic and perineal pain: Secondary | ICD-10-CM

## 2018-02-12 DIAGNOSIS — N898 Other specified noninflammatory disorders of vagina: Secondary | ICD-10-CM | POA: Insufficient documentation

## 2018-02-12 DIAGNOSIS — A7489 Other chlamydial diseases: Secondary | ICD-10-CM | POA: Insufficient documentation

## 2018-02-12 DIAGNOSIS — N979 Female infertility, unspecified: Secondary | ICD-10-CM | POA: Diagnosis not present

## 2018-02-12 DIAGNOSIS — B9689 Other specified bacterial agents as the cause of diseases classified elsewhere: Secondary | ICD-10-CM | POA: Diagnosis not present

## 2018-02-12 NOTE — Progress Notes (Signed)
Patient ID: Kelsey Rodriguez, female   DOB: [birth year], 27 y.o.   MRN: [id number]  Chief Complaint  Patient presents with  . Pelvic Pain    HPI Kelsey Rodriguez is a 27 y.o. female.   She is married P0 here for follow up of irregular periods. Since I saw her last her periods have been more normal. However, while she was in [place] the last few months, She developed severe pelvic pain and went to a hospital.She was given antibiotics. She says that the pain is improved but still present in the LLQ.  Of note, she had a left hydrosalpinx on her last ultrasound here months ago. She also complains of a watery vaginal discharge.  She is still trying to get pregnant.   HPI  Past Medical History:  Diagnosis Date  . 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 Adrenal insufficiency ([place])   . Anemia of chronic disease 03/11/2012  . Bell's palsy 08/26/2013  . 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 Chronic back pain   . Chronic leg pain    bilateral knees, ankles  . Fatigue   . 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 HIV (human immunodeficiency virus infection) ([place]) 02/2012  . Laceration of ankle, right 11/18/2012  . Lumbar radiculopathy   . Pelvic pain   . Reflux esophagitis 03/11/2012  . Tuberculosis   . 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 Vertigo     Past Surgical History:  Procedure Laterality Date  . APPENDECTOMY  ~ 2000  . DILATION AND CURETTAGE OF UTERUS  2008  . ESOPHAGOGASTRODUODENOSCOPY  03/11/2012   Procedure: ESOPHAGOGASTRODUODENOSCOPY (EGD);  Surgeon: Lafayette Dragon, MD;  Location: [facility] ENDOSCOPY;  Service: Endoscopy;  Laterality: N/A;  . ESOPHAGOGASTRODUODENOSCOPY N/A 03/07/2014   Procedure: ESOPHAGOGASTRODUODENOSCOPY (EGD);  Surgeon: Gatha Mayer, MD;  Location: [facility] ENDOSCOPY;  Service: Endoscopy;  Laterality: N/A;  . LUNG BIOPSY  02/2012  . TOTAL HIP ARTHROPLASTY Left 12/14/2015   Procedure: LEFT TOTAL HIP ARTHROPLASTY ANTERIOR APPROACH;  Surgeon: Mcarthur Rossetti, MD;   [facility];  Service: Orthopedics;  Laterality: Left;  . TOTAL HIP ARTHROPLASTY Right 04/04/2016   Procedure: RIGHT TOTAL HIP ARTHROPLASTY ANTERIOR APPROACH;  Surgeon: Mcarthur Rossetti, MD;  [facility]: [facility]S;  Service: Orthopedics;  Laterality: Right;    Family History  Problem Relation Age of Onset  . Heart disease Father        Vague not clearly cardiac    Social History Social History   Tobacco Use  . Smoking status: Never Smoker  . Smokeless tobacco: Never Used  Substance Use Topics  . Alcohol use: No    Alcohol/week: 0.0 oz    Comment: socially  . Drug use: No    Allergies  Allergen Reactions  . Hydrocodone Itching and Nausea Only    Tolerates Oxycodone  . Tramadol Itching and Nausea Only    Tolerates oxycodone    Current Outpatient Medications  Medication Sig Dispense Refill  . amitriptyline (ELAVIL) 25 MG tablet 1/2 pill each bedtime x 1 week, then 1 pill nightly x 1 week, then 1 1/2 pills nightly x 1 week, then 2 pills nightly thereafter. 60 tablet 5  . Darunavir-Cobicisctat-Emtricitabine-Tenofovir Alafenamide (SYMTUZA) 800-150-200-10 MG TABS Take 1 tablet by mouth daily with breakfast. 30 tablet 6  . meclizine (ANTIVERT) 32 MG tablet Take 1 tablet (32 mg total) by mouth 3 (three) times daily as needed. If needed for "room spinning" dizziness 30 tablet 0  . ondansetron (ZOFRAN ODT) 8 MG  disintegrating tablet Take 1 tablet (8 mg total) by mouth every 8 (eight) hours as needed for nausea or vomiting. 90 tablet 3  . polyethylene glycol powder (GLYCOLAX/MIRALAX) powder Take 17 g by mouth 2 (two) times daily as needed. 119 g 1  . promethazine (PHENERGAN) 12.5 MG tablet Take 1 tablet (12.5 mg total) by mouth every 8 (eight) hours as needed for nausea or vomiting. 90 tablet 3   No current facility-administered medications for this visit.     Review of Systems Review of Systems  Blood pressure 139/90, pulse 89, weight 162 lb 14.4 oz (73.9 kg), last menstrual  period 01/23/2018.  Physical Exam Physical Exam Breathing, conversing, and ambulating normally .Well nourished, well hydrated Black female, no apparent distress Abd- benign Cervix- large amount of frothy white discharge, no CMT Bimanual exam reveals no masses but the exam was limited by her voluntary guarding.  Data Reviewed   Assessment    Infertility Vaginal discharge Pelvic pain    Plan   wet prep sent, treat accordingly Repeat ultrasound ordered Trying to find a RE that accepts medicaid Come back 4 weeks        [place] 02/12/2018, 9:23 AM

## 2018-02-16 ENCOUNTER — Ambulatory Visit (HOSPITAL_COMMUNITY)
Admission: RE | Admit: 2018-02-16 | Discharge: 2018-02-16 | Disposition: A | Discharge status: Home / Self Care | Payer: Medicaid Other | Source: Ambulatory Visit | Attending: Obstetrics & Gynecology | Admitting: Obstetrics & Gynecology

## 2018-02-16 DIAGNOSIS — N83201 Unspecified ovarian cyst, right side: Secondary | ICD-10-CM | POA: Insufficient documentation

## 2018-02-16 DIAGNOSIS — R102 Pelvic and perineal pain: Secondary | ICD-10-CM | POA: Diagnosis present

## 2018-02-16 DIAGNOSIS — N83202 Unspecified ovarian cyst, left side: Secondary | ICD-10-CM | POA: Insufficient documentation

## 2018-02-16 LAB — CERVICOVAGINAL ANCILLARY ONLY
Bacterial vaginitis: POSITIVE — AB
Candida vaginitis: POSITIVE — AB
Chlamydia: POSITIVE — AB
Neisseria Gonorrhea: NEGATIVE
Trichomonas: NEGATIVE

## 2018-02-17 ENCOUNTER — Encounter (HOSPITAL_COMMUNITY): Payer: Self-pay

## 2018-02-17 ENCOUNTER — Telehealth: Payer: Self-pay

## 2018-02-17 ENCOUNTER — Other Ambulatory Visit: Payer: Self-pay

## 2018-02-17 DIAGNOSIS — B3731 Acute candidiasis of vulva and vagina: Secondary | ICD-10-CM

## 2018-02-17 DIAGNOSIS — A749 Chlamydial infection, unspecified: Secondary | ICD-10-CM

## 2018-02-17 DIAGNOSIS — Z8611 Personal history of tuberculosis: Secondary | ICD-10-CM

## 2018-02-17 DIAGNOSIS — E871 Hypo-osmolality and hyponatremia: Secondary | ICD-10-CM | POA: Diagnosis present

## 2018-02-17 DIAGNOSIS — Z885 Allergy status to narcotic agent status: Secondary | ICD-10-CM

## 2018-02-17 DIAGNOSIS — B9689 Other specified bacterial agents as the cause of diseases classified elsewhere: Secondary | ICD-10-CM

## 2018-02-17 DIAGNOSIS — M79605 Pain in left leg: Secondary | ICD-10-CM | POA: Diagnosis present

## 2018-02-17 DIAGNOSIS — R079 Chest pain, unspecified: Secondary | ICD-10-CM | POA: Diagnosis present

## 2018-02-17 DIAGNOSIS — Z8661 Personal history of infections of the central nervous system: Secondary | ICD-10-CM

## 2018-02-17 DIAGNOSIS — R1032 Left lower quadrant pain: Secondary | ICD-10-CM | POA: Diagnosis not present

## 2018-02-17 DIAGNOSIS — A879 Viral meningitis, unspecified: Principal | ICD-10-CM | POA: Diagnosis present

## 2018-02-17 DIAGNOSIS — Z79899 Other long term (current) drug therapy: Secondary | ICD-10-CM

## 2018-02-17 DIAGNOSIS — M79604 Pain in right leg: Secondary | ICD-10-CM | POA: Diagnosis present

## 2018-02-17 DIAGNOSIS — N760 Acute vaginitis: Secondary | ICD-10-CM

## 2018-02-17 DIAGNOSIS — N739 Female pelvic inflammatory disease, unspecified: Secondary | ICD-10-CM | POA: Diagnosis present

## 2018-02-17 DIAGNOSIS — Z96643 Presence of artificial hip joint, bilateral: Secondary | ICD-10-CM | POA: Clinically undetermined

## 2018-02-17 DIAGNOSIS — B20 Human immunodeficiency virus [HIV] disease: Secondary | ICD-10-CM | POA: Diagnosis present

## 2018-02-17 DIAGNOSIS — G8929 Other chronic pain: Secondary | ICD-10-CM | POA: Diagnosis present

## 2018-02-17 DIAGNOSIS — B373 Candidiasis of vulva and vagina: Secondary | ICD-10-CM

## 2018-02-17 LAB — CBC
HCT: 34.8 % — ABNORMAL LOW (ref 36.0–46.0)
Hemoglobin: 11.5 g/dL — ABNORMAL LOW (ref 12.0–15.0)
MCH: 29 pg (ref 26.0–34.0)
MCHC: 33 g/dL (ref 30.0–36.0)
MCV: 87.7 fL (ref 78.0–100.0)
Platelets: 150 10*3/uL (ref 150–400)
RBC: 3.97 MIL/uL (ref 3.87–5.11)
RDW: 17.8 % — ABNORMAL HIGH (ref 11.5–15.5)
WBC: 8.8 10*3/uL (ref 4.0–10.5)

## 2018-02-17 LAB — BASIC METABOLIC PANEL
Anion gap: 11 (ref 5–15)
BUN: 5 mg/dL — ABNORMAL LOW (ref 6–20)
CO2: 18 mmol/L — ABNORMAL LOW (ref 22–32)
Calcium: 9.3 mg/dL (ref 8.9–10.3)
Chloride: 104 mmol/L (ref 101–111)
Creatinine, Ser: 0.75 mg/dL (ref 0.44–1.00)
GFR calc Af Amer: >60 mL/min (ref >60)
GFR calc non Af Amer: >60 mL/min (ref >60)
Glucose, Bld: 93 mg/dL (ref 65–99)
Potassium: 3.9 mmol/L (ref 3.5–5.1)
Sodium: 133 mmol/L — ABNORMAL LOW (ref 135–145)

## 2018-02-17 MED ORDER — METRONIDAZOLE 500 MG PO TABS: 500.0000 mg | ORAL_TABLET | Freq: Two times a day (BID) | ORAL | 0 refills | Status: DC

## 2018-02-17 MED ORDER — AZITHROMYCIN 250 MG PO TABS: 1000.0000 mg | ORAL_TABLET | Freq: Once | ORAL | 0 refills | Status: DC

## 2018-02-17 MED ORDER — FLUCONAZOLE 150 MG PO TABS: 150.0000 mg | ORAL_TABLET | Freq: Once | ORAL | 0 refills | Status: DC

## 2018-02-17 NOTE — Telephone Encounter (Signed)
Called pt to inform her of her positive Chlamydia, BV, and yeast test results. Advised pt to have partner treated. Pt verbalized understanding. Medication was sent to the pharmacy. STD report was sent to [facility].

## 2018-02-17 NOTE — ED Triage Notes (Signed)
Pt states that yesterday she began to have a severe headache with neck pain and stiffness, fevers, cough an sore throat. Hx of meningitis, states this feel the same

## 2018-02-18 ENCOUNTER — Emergency Department (HOSPITAL_COMMUNITY): Payer: Medicaid Other

## 2018-02-18 ENCOUNTER — Inpatient Hospital Stay (HOSPITAL_COMMUNITY)
Admission: EM | Admit: 2018-02-18 | Discharge: 2018-02-19 | DRG: 977 | Disposition: A | Discharge status: Home / Self Care | Payer: Medicaid Other | Attending: Family Medicine | Admitting: Family Medicine

## 2018-02-18 ENCOUNTER — Other Ambulatory Visit: Payer: Self-pay

## 2018-02-18 ENCOUNTER — Inpatient Hospital Stay (HOSPITAL_COMMUNITY): Payer: Medicaid Other

## 2018-02-18 DIAGNOSIS — Z8611 Personal history of tuberculosis: Secondary | ICD-10-CM | POA: Diagnosis not present

## 2018-02-18 DIAGNOSIS — A159 Respiratory tuberculosis unspecified: Secondary | ICD-10-CM

## 2018-02-18 DIAGNOSIS — G039 Meningitis, unspecified: Secondary | ICD-10-CM

## 2018-02-18 DIAGNOSIS — B9689 Other specified bacterial agents as the cause of diseases classified elsewhere: Secondary | ICD-10-CM

## 2018-02-18 DIAGNOSIS — Z96643 Presence of artificial hip joint, bilateral: Secondary | ICD-10-CM | POA: Diagnosis not present

## 2018-02-18 DIAGNOSIS — G8929 Other chronic pain: Secondary | ICD-10-CM | POA: Diagnosis present

## 2018-02-18 DIAGNOSIS — N739 Female pelvic inflammatory disease, unspecified: Secondary | ICD-10-CM | POA: Diagnosis present

## 2018-02-18 DIAGNOSIS — R509 Fever, unspecified: Secondary | ICD-10-CM | POA: Diagnosis not present

## 2018-02-18 DIAGNOSIS — R1032 Left lower quadrant pain: Secondary | ICD-10-CM | POA: Diagnosis not present

## 2018-02-18 DIAGNOSIS — A879 Viral meningitis, unspecified: Secondary | ICD-10-CM | POA: Diagnosis present

## 2018-02-18 DIAGNOSIS — R51 Headache: Secondary | ICD-10-CM | POA: Diagnosis not present

## 2018-02-18 DIAGNOSIS — A749 Chlamydial infection, unspecified: Secondary | ICD-10-CM

## 2018-02-18 DIAGNOSIS — N760 Acute vaginitis: Secondary | ICD-10-CM

## 2018-02-18 DIAGNOSIS — Z8661 Personal history of infections of the central nervous system: Secondary | ICD-10-CM | POA: Diagnosis not present

## 2018-02-18 DIAGNOSIS — B379 Candidiasis, unspecified: Secondary | ICD-10-CM

## 2018-02-18 DIAGNOSIS — J029 Acute pharyngitis, unspecified: Secondary | ICD-10-CM

## 2018-02-18 DIAGNOSIS — B20 Human immunodeficiency virus [HIV] disease: Secondary | ICD-10-CM

## 2018-02-18 DIAGNOSIS — M79604 Pain in right leg: Secondary | ICD-10-CM | POA: Diagnosis present

## 2018-02-18 DIAGNOSIS — R071 Chest pain on breathing: Secondary | ICD-10-CM

## 2018-02-18 DIAGNOSIS — E871 Hypo-osmolality and hyponatremia: Secondary | ICD-10-CM | POA: Diagnosis present

## 2018-02-18 DIAGNOSIS — Z885 Allergy status to narcotic agent status: Secondary | ICD-10-CM | POA: Diagnosis not present

## 2018-02-18 DIAGNOSIS — Z21 Asymptomatic human immunodeficiency virus [HIV] infection status: Secondary | ICD-10-CM

## 2018-02-18 DIAGNOSIS — R079 Chest pain, unspecified: Secondary | ICD-10-CM | POA: Diagnosis present

## 2018-02-18 DIAGNOSIS — M79605 Pain in left leg: Secondary | ICD-10-CM | POA: Diagnosis present

## 2018-02-18 DIAGNOSIS — Z79899 Other long term (current) drug therapy: Secondary | ICD-10-CM | POA: Diagnosis not present

## 2018-02-18 HISTORY — DX: Meningitis, unspecified: G03.9

## 2018-02-18 LAB — HERPES SIMPLEX VIRUS(HSV) DNA BY PCR
HSV 1 DNA: NEGATIVE
HSV 2 DNA: NEGATIVE

## 2018-02-18 LAB — HEPATIC FUNCTION PANEL
ALT: 15 U/L (ref 14–54)
AST: 22 U/L (ref 15–41)
Albumin: 3.8 g/dL (ref 3.5–5.0)
Alkaline Phosphatase: 85 U/L (ref 38–126)
Bilirubin, Direct: <0.1 mg/dL — ABNORMAL LOW (ref 0.1–0.5)
Total Bilirubin: 0.4 mg/dL (ref 0.3–1.2)
Total Protein: 8.1 g/dL (ref 6.5–8.1)

## 2018-02-18 LAB — WET PREP, GENITAL
Sperm: NONE SEEN
Trich, Wet Prep: NONE SEEN
Yeast Wet Prep HPF POC: NONE SEEN

## 2018-02-18 LAB — CBC
HCT: 35.1 % — ABNORMAL LOW (ref 36.0–46.0)
Hemoglobin: 11.7 g/dL — ABNORMAL LOW (ref 12.0–15.0)
MCH: 29.5 pg (ref 26.0–34.0)
MCHC: 33.3 g/dL (ref 30.0–36.0)
MCV: 88.4 fL (ref 78.0–100.0)
Platelets: 126 10*3/uL — ABNORMAL LOW (ref 150–400)
RBC: 3.97 MIL/uL (ref 3.87–5.11)
RDW: 18 % — ABNORMAL HIGH (ref 11.5–15.5)
WBC: 5.9 10*3/uL (ref 4.0–10.5)

## 2018-02-18 LAB — URINALYSIS, ROUTINE W REFLEX MICROSCOPIC
Bilirubin Urine: NEGATIVE
Glucose, UA: NEGATIVE mg/dL
Hgb urine dipstick: NEGATIVE
Ketones, ur: NEGATIVE mg/dL
Leukocytes, UA: NEGATIVE
Nitrite: NEGATIVE
Protein, ur: NEGATIVE mg/dL
Specific Gravity, Urine: 1.004 — ABNORMAL LOW (ref 1.005–1.030)
pH: 6 (ref 5.0–8.0)

## 2018-02-18 LAB — BASIC METABOLIC PANEL
Anion gap: 9 (ref 5–15)
BUN: <5 mg/dL — ABNORMAL LOW (ref 6–20)
CO2: 21 mmol/L — ABNORMAL LOW (ref 22–32)
Calcium: 8.6 mg/dL — ABNORMAL LOW (ref 8.9–10.3)
Chloride: 106 mmol/L (ref 101–111)
Creatinine, Ser: 0.71 mg/dL (ref 0.44–1.00)
GFR calc Af Amer: >60 mL/min (ref >60)
GFR calc non Af Amer: >60 mL/min (ref >60)
Glucose, Bld: 83 mg/dL (ref 65–99)
Potassium: 3.5 mmol/L (ref 3.5–5.1)
Sodium: 136 mmol/L (ref 135–145)

## 2018-02-18 LAB — CSF CELL COUNT WITH DIFFERENTIAL
RBC Count, CSF: 14 /mm3 — ABNORMAL HIGH
RBC Count, CSF: 19 /mm3 — ABNORMAL HIGH
Tube #: 1
Tube #: 4
WBC, CSF: 2 /mm3 (ref 0–5)
WBC, CSF: 3 /mm3 (ref 0–5)

## 2018-02-18 LAB — GLUCOSE, CSF: Glucose, CSF: 51 mg/dL (ref 40–70)

## 2018-02-18 LAB — PREGNANCY, URINE: Preg Test, Ur: NEGATIVE

## 2018-02-18 LAB — CRYPTOCOCCAL ANTIGEN, CSF: Crypto Ag: NEGATIVE

## 2018-02-18 LAB — PROTEIN, CSF: Total  Protein, CSF: 14 mg/dL — ABNORMAL LOW (ref 15–45)

## 2018-02-18 MED ORDER — ACETAMINOPHEN 325 MG PO TABS
650.0000 mg | ORAL_TABLET | Freq: Four times a day (QID) | ORAL | Status: DC | PRN
  Administered 2018-02-18 – 2018-02-19 (×2): 650 mg via ORAL
  Filled 2018-02-18: qty 2

## 2018-02-18 MED ORDER — SODIUM CHLORIDE 0.9 % IV SOLN
2.0000 g | Freq: Once | INTRAVENOUS | Status: DC
  Filled 2018-02-18: qty 2000

## 2018-02-18 MED ORDER — DARUN-COBIC-EMTRICIT-TENOFAF 800-150-200-10 MG PO TABS
1.0000 | ORAL_TABLET | Freq: Every day | ORAL | Status: DC
  Administered 2018-02-18 – 2018-02-19 (×2): 1 via ORAL
  Filled 2018-02-18 (×2): qty 1

## 2018-02-18 MED ORDER — POLYETHYLENE GLYCOL 3350 17 G PO PACK: 17.0000 g | PACK | Freq: Two times a day (BID) | ORAL | Status: DC | PRN

## 2018-02-18 MED ORDER — METRONIDAZOLE 500 MG PO TABS
500.0000 mg | ORAL_TABLET | Freq: Two times a day (BID) | ORAL | Status: DC
  Administered 2018-02-18 – 2018-02-19 (×2): 500 mg via ORAL
  Filled 2018-02-18 (×2): qty 1

## 2018-02-18 MED ORDER — DIPHENHYDRAMINE HCL 50 MG/ML IJ SOLN
25.0000 mg | Freq: Once | INTRAMUSCULAR | Status: AC
  Administered 2018-02-18: 25 mg via INTRAVENOUS
  Filled 2018-02-18: qty 1

## 2018-02-18 MED ORDER — DOXYCYCLINE HYCLATE 100 MG PO TABS
100.0000 mg | ORAL_TABLET | Freq: Two times a day (BID) | ORAL | Status: DC
  Administered 2018-02-18 – 2018-02-19 (×2): 100 mg via ORAL
  Filled 2018-02-18 (×2): qty 1

## 2018-02-18 MED ORDER — LIDOCAINE HCL (PF) 1 % IJ SOLN
INTRAMUSCULAR | Status: AC
  Administered 2018-02-18: 5 mL
  Filled 2018-02-18: qty 5

## 2018-02-18 MED ORDER — AZITHROMYCIN 500 MG PO TABS
1000.0000 mg | ORAL_TABLET | Freq: Once | ORAL | Status: AC
  Administered 2018-02-18: 1000 mg via ORAL
  Filled 2018-02-18: qty 2

## 2018-02-18 MED ORDER — ENOXAPARIN SODIUM 40 MG/0.4ML ~~LOC~~ SOLN
40.0000 mg | SUBCUTANEOUS | Status: DC
  Administered 2018-02-18 – 2018-02-19 (×2): 40 mg via SUBCUTANEOUS
  Filled 2018-02-18 (×2): qty 0.4

## 2018-02-18 MED ORDER — POLYETHYLENE GLYCOL 3350 17 GM/SCOOP PO POWD
17.0000 g | Freq: Two times a day (BID) | ORAL | Status: DC | PRN
  Filled 2018-02-18: qty 255

## 2018-02-18 MED ORDER — SODIUM CHLORIDE 0.9 % IV BOLUS (SEPSIS)
1000.0000 mL | Freq: Once | INTRAVENOUS | Status: AC
  Administered 2018-02-18: 1000 mL via INTRAVENOUS

## 2018-02-18 MED ORDER — GADOBENATE DIMEGLUMINE 529 MG/ML IV SOLN
15.0000 mL | Freq: Once | INTRAVENOUS | Status: AC
  Administered 2018-02-18: 15 mL via INTRAVENOUS

## 2018-02-18 MED ORDER — VANCOMYCIN HCL IN DEXTROSE 1-5 GM/200ML-% IV SOLN
1000.0000 mg | Freq: Once | INTRAVENOUS | Status: DC
  Filled 2018-02-18: qty 200

## 2018-02-18 MED ORDER — FLUCONAZOLE 150 MG PO TABS
150.0000 mg | ORAL_TABLET | Freq: Once | ORAL | Status: AC
  Administered 2018-02-18: 150 mg via ORAL
  Filled 2018-02-18: qty 1

## 2018-02-18 MED ORDER — SODIUM CHLORIDE 0.9 % IV SOLN
2.0000 g | Freq: Once | INTRAVENOUS | Status: AC
  Administered 2018-02-18: 2 g via INTRAVENOUS
  Filled 2018-02-18: qty 20

## 2018-02-18 MED ORDER — AMITRIPTYLINE HCL 25 MG PO TABS
25.0000 mg | ORAL_TABLET | Freq: Every day | ORAL | Status: DC
  Administered 2018-02-18: 25 mg via ORAL
  Filled 2018-02-18: qty 1

## 2018-02-18 MED ORDER — METOCLOPRAMIDE HCL 5 MG/ML IJ SOLN
10.0000 mg | Freq: Once | INTRAMUSCULAR | Status: AC
  Administered 2018-02-18: 10 mg via INTRAVENOUS
  Filled 2018-02-18: qty 2

## 2018-02-18 NOTE — ED Notes (Signed)
ED Provider at bedside. 

## 2018-02-18 NOTE — ED Provider Notes (Signed)
[facility] EMERGENCY DEPARTMENT Provider Note   CSN: [id number] Arrival date & time: 02/17/18  2144     History   Chief Complaint Chief Complaint  Patient presents with  . Headache    HPI Kelsey Rodriguez is a 27 y.o. female.  The history is provided by the patient.  Headache   This is a new problem. The current episode started yesterday. The problem occurs constantly. The problem has been gradually worsening. The pain is located in the frontal region. The quality of the pain is described as dull. The pain is moderate. Associated symptoms include a fever, malaise/fatigue and nausea. Pertinent negatives include no vomiting.   Patient with history of HIV, previous history of tuberculosis presents with fever up to 101, headache, neck pain over the past day.  No vomiting.  She also reports sore throat.  No significant cough is reported. Reports HIV medication compliance. She reports she just got back from [place] at the end of February after being there for several months. She stayed in [place] the entire time  She reports previous history of meningitis, this feels similar to that episode Past Medical History:  Diagnosis Date  . 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 Adrenal insufficiency ([place])   . Anemia of chronic disease 03/11/2012  . Bell's palsy 08/26/2013  . 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 Chronic back pain   . Chronic leg pain    bilateral knees, ankles  . Fatigue   . 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 HIV (human immunodeficiency virus infection) ([place]) 02/2012  . Laceration of ankle, right 11/18/2012  . Lumbar radiculopathy   . Pelvic pain   . Reflux esophagitis 03/11/2012  . Tuberculosis   . 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 Vertigo     Patient Active Problem List   Diagnosis Date Noted  . Infertility, female 02/12/2018  . Chronic right-sided low back pain with right-sided sciatica 10/21/2017  . ASCUS with positive  high risk HPV cervical 09/14/2017  . Presence of right artificial hip joint 08/24/2017  . Right hip pain 08/24/2017  . Acute right-sided low back pain with right-sided sciatica 08/24/2017  . Complex regional pain syndrome 02/03/2017  . Constipation 02/03/2017  . Irregular menstrual cycle 02/03/2017  . 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 Avascular necrosis of bone of right hip ([place]) 04/04/2016  . 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 Avascular necrosis of bone of left hip ([place]) 12/14/2015  . 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 Overweight (BMI 25.0-29.9) 04/20/2015  . Seasonal allergies 03/20/2015  . Menorrhagia 03/19/2015  . Back pain of lumbar region with sciatica 02/12/2015  . Primary adrenal insufficiency ([place]) 01/03/2015  . Laceration of ankle, right 11/18/2012  . 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 Arthralgia 05/20/2012  . HIV (human immunodeficiency virus infection) ([place]) 03/16/2012  . 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 Iron deficiency anemia 03/11/2012  . Reflux esophagitis 03/11/2012    Past Surgical History:  Procedure Laterality Date  . APPENDECTOMY  ~ 2000  . DILATION AND CURETTAGE OF UTERUS  2008  . ESOPHAGOGASTRODUODENOSCOPY  03/11/2012   Procedure: ESOPHAGOGASTRODUODENOSCOPY (EGD);  Surgeon: Lafayette Dragon, MD;  [facility]: [facility] ENDOSCOPY;  Service: Endoscopy;  Laterality: N/A;  . ESOPHAGOGASTRODUODENOSCOPY N/A 03/07/2014   Procedure: ESOPHAGOGASTRODUODENOSCOPY (EGD);  Surgeon: Gatha Mayer, MD;  Location[facility] ENDOSCOPY;  Service: Endoscopy;  Laterality: N/A;  . LUNG BIOPSY  02/2012  . TOTAL HIP ARTHROPLASTY Left  12/14/2015   Procedure: LEFT TOTAL HIP ARTHROPLASTY ANTERIOR APPROACH;  Surgeon: Mcarthur Rossetti, MD;  Location: [facility] ORS;  Service: Orthopedics;  Laterality: Left;  . TOTAL HIP ARTHROPLASTY Right 04/04/2016   Procedure: RIGHT TOTAL HIP ARTHROPLASTY ANTERIOR APPROACH;  Surgeon: Mcarthur Rossetti, MD;   Location: [facility] ORS;  Service: Orthopedics;  Laterality: Right;    OB History    Gravida Para Term Preterm AB Living   1 0 0 0 1 0   SAB TAB Ectopic Multiple Live Births   1 0 0 0         Home Medications    Prior to Admission medications   Medication Sig Start Date End Date Taking? Authorizing Provider  acetaminophen (TYLENOL) 325 MG tablet Take 650 mg by mouth every 6 (six) hours as needed for headache.   Yes [provider]  Darunavir-Cobicisctat-Emtricitabine-Tenofovir Alafenamide (SYMTUZA) 800-150-200-10 MG TABS Take 1 tablet by mouth daily with breakfast. 11/17/17  Yes Carlyle Basques, MD  amitriptyline (ELAVIL) 25 MG tablet 1/2 pill each bedtime x 1 week, then 1 pill nightly x 1 week, then 1 1/2 pills nightly x 1 week, then 2 pills nightly thereafter. Patient not taking: Reported on 02/18/2018 01/07/17   Star Age, MD  azithromycin (ZITHROMAX) 250 MG tablet Take 4 tablets (1,000 mg total) by mouth once for 1 dose. Patient not taking: Reported on 02/18/2018 02/17/18 02/19/24  Emily Filbert, MD  fluconazole (DIFLUCAN) 150 MG tablet Take 1 tablet (150 mg total) by mouth once for 1 dose. Patient not taking: Reported on 02/18/2018 02/17/18 02/18/25  Emily Filbert, MD  meclizine (ANTIVERT) 32 MG tablet Take 1 tablet (32 mg total) by mouth 3 (three) times daily as needed. If needed for "room spinning" dizziness Patient not taking: Reported on 02/18/2018 05/28/16   Carlyle Basques, MD  metroNIDAZOLE (FLAGYL) 500 MG tablet Take 1 tablet (500 mg total) by mouth 2 (two) times daily. Patient not taking: Reported on 02/18/2018 02/17/18   Emily Filbert, MD  ondansetron (ZOFRAN ODT) 8 MG disintegrating tablet Take 1 tablet (8 mg total) by mouth every 8 (eight) hours as needed for nausea or vomiting. Patient not taking: Reported on 02/18/2018 04/23/17   Carlyle Basques, MD  polyethylene glycol powder (GLYCOLAX/MIRALAX) powder Take 17 g by mouth 2 (two) times daily as needed. Patient not taking: Reported on  02/18/2018 02/03/17   Everrett Coombe, MD  promethazine (PHENERGAN) 12.5 MG tablet Take 1 tablet (12.5 mg total) by mouth every 8 (eight) hours as needed for nausea or vomiting. Patient not taking: Reported on 02/18/2018 04/23/17   Carlyle Basques, MD    Family History Family History  Problem Relation Age of Onset  . Heart disease Father        Vague not clearly cardiac    Social History Social History   Tobacco Use  . Smoking status: Never Smoker  . Smokeless tobacco: Never Used  Substance Use Topics  . Alcohol use: No    Alcohol/week: 0.0 oz    Comment: socially  . Drug use: No     Allergies   Hydrocodone and Tramadol   Review of Systems Review of Systems  Constitutional: Positive for fever and malaise/fatigue.  Gastrointestinal: Positive for nausea. Negative for vomiting.  Musculoskeletal: Positive for neck pain and neck stiffness.  Neurological: Positive for headaches.  All other systems reviewed and are negative.    Physical Exam Updated Vital Signs BP 116/75 (BP Location: Right Arm)   Pulse 100  Temp 99.8 F (37.7 C) (Oral)   Resp 16   LMP 01/23/2018   SpO2 100%   Physical Exam CONSTITUTIONAL: Well developed/well nourished, mildly uncomfortable appearing HEAD: Normocephalic/atraumatic EYES: EOMI/PERRL ENMT: Mucous membranes moist, uvula midline no erythema or exudates NECK: supple no meningeal signs SPINE/BACK:entire spine nontender CV: S1/S2 noted, no murmurs/rubs/gallops noted LUNGS: Lungs are clear to auscultation bilaterally, no apparent distress ABDOMEN: soft, nontender, no rebound or guarding, bowel sounds noted throughout abdomen GU:no cva tenderness NEURO: Pt is awake/alert/appropriate, moves all extremitiesx4.  No facial droop.  No arm or leg drift EXTREMITIES: pulses normal/equal, full ROM SKIN: warm, color normal PSYCH: no abnormalities of mood noted, alert and oriented to situation   ED Treatments / Results  Labs (all labs ordered are  listed, but only abnormal results are displayed) Labs Reviewed  CBC - Abnormal; Notable for the following components:      Result Value   Hemoglobin 11.5 (*)    HCT 34.8 (*)    RDW 17.8 (*)    All other components within normal limits  BASIC METABOLIC PANEL - Abnormal; Notable for the following components:   Sodium 133 (*)    CO2 18 (*)    BUN 5 (*)    All other components within normal limits  URINE CULTURE  CSF CULTURE  GRAM STAIN  HEPATIC FUNCTION PANEL  URINALYSIS, ROUTINE W REFLEX MICROSCOPIC  INFLUENZA PANEL BY PCR (TYPE A & B)  CSF CELL COUNT WITH DIFFERENTIAL  CSF CELL COUNT WITH DIFFERENTIAL  GLUCOSE, CSF  PROTEIN, CSF  HERPES SIMPLEX VIRUS(HSV) DNA BY PCR  CRYPTOCOCCAL ANTIGEN, CSF  POC URINE PREG, ED    EKG  EKG Interpretation None       Radiology US Pelvis Transvanginal Non-ob (tv Only)  Result Date: 02/16/2018 CLINICAL DATA:  Left lower quadrant pain EXAM: ULTRASOUND PELVIS TRANSVAGINAL TECHNIQUE: Transvaginal ultrasound examination of the pelvis was performed including evaluation of the uterus, ovaries, adnexal regions, and pelvic cul-de-sac. COMPARISON:  09/21/2017 FINDINGS: Uterus Measurements: 8.0 x 3.7 x 4.2 cm. No fibroids or other mass visualized. Endometrium Thickness: 7 mm in thickness.  No focal abnormality visualized. Right ovary Measurements: 5.2 x 3.9 x 6.2 cm. Simple appearing cyst measures 4.8 x 3.3 x 2.5 cm. Left ovary Measurements: 5.3 x 3.5 x 4.5 cm. Simple appearing cyst measures 3.9 x 2.9 x 2.7 cm, similar to prior study. Other findings:  No abnormal free fluid IMPRESSION: Bilateral simple appearing ovarian cysts, 4.8 cm on the right and 3.9 cm on the left. These are likely functional cysts. These could be followed with repeat ultrasound in 3-6 months to ensure resolution. Electronically Signed   By: Rolm Baptise M.D.   On: 02/16/2018 10:46   Dg Chest Port 1 View  Result Date: 02/18/2018 CLINICAL DATA:  Initial evaluation for acute fever,  headache. EXAM: PORTABLE CHEST 1 VIEW COMPARISON:  Prior radiograph from 10/27/2017. FINDINGS: The cardiac and mediastinal silhouettes are stable in size and contour, and remain within normal limits. The lungs are normally inflated. No airspace consolidation, pleural effusion, or pulmonary edema is identified. There is no pneumothorax. No acute osseous abnormality identified. IMPRESSION: No active disease. Electronically Signed   By: Jeannine Boga M.D.   On: 02/18/2018 05:07    Procedures .Lumbar Puncture Date/Time: 02/18/2018 6:11 AM Performed by: Ripley Fraise, MD Authorized by: Ripley Fraise, MD   Consent:    Consent obtained:  Written   Consent given by:  Patient   Risks discussed:  Bleeding, infection  and headache Pre-procedure details:    Procedure purpose:  Diagnostic   Preparation: Patient was prepped and draped in usual sterile fashion   Anesthesia (see MAR for exact dosages):    Anesthesia method:  Local infiltration Procedure details:    Lumbar space:  L3-L4 interspace   Patient position:  Sitting   Needle gauge:  20   Number of attempts:  3   Fluid appearance:  Clear   Tubes of fluid:  4   Total volume (ml):  10 Post-procedure:    Puncture site:  Adhesive bandage applied and direct pressure applied   Patient tolerance of procedure:  Tolerated well, no immediate complications     Medications Ordered in ED Medications  ampicillin (OMNIPEN) 2 g in sodium chloride 0.9 % 100 mL IVPB (not administered)  cefTRIAXone (ROCEPHIN) 2 g in sodium chloride 0.9 % 100 mL IVPB (not administered)  vancomycin (VANCOCIN) IVPB 1000 mg/200 mL premix (not administered)  sodium chloride 0.9 % bolus 1,000 mL (1,000 mLs Intravenous New Bag/Given 02/18/18 0556)  metoCLOPramide (REGLAN) injection 10 mg (10 mg Intravenous Given 02/18/18 0625)  diphenhydrAMINE (BENADRYL) injection 25 mg (25 mg Intravenous Given 02/18/18 0626)  lidocaine (PF) (XYLOCAINE) 1 % injection (5 mLs  Given by Other  02/18/18 0557)     Initial Impression / Assessment and Plan / ED Course  I have reviewed the triage vital signs and the nursing notes.  Pertinent labs & imaging results that were available during my care of the patient were reviewed by me and considered in my medical decision making (see chart for details).     5:01 AM Patient history of HIV, previous history of aseptic meningitis presents with fever/headache/neck pain. CD4 count from December 2018 equals 320 I feel she should be admitted.  We will empirically cover for meningitis.  She has consented for lumbar puncture to rule out meningitis. I suspect she is low risk for malaria as she reported she only stayed in [place] in [place] 6:31 AM Patient tolerated lumbar puncture well.  IV antibiotics have been ordered.  I feel she needs to be admitted.  Discussed with family medicine resident for admission. Final Clinical Impressions(s) / ED Diagnoses   Final diagnoses:  Meningitis    ED Discharge Orders    None       Ripley Fraise, MD 02/18/18 [phone]

## 2018-02-18 NOTE — Consult Note (Signed)
[facility] for Infectious Disease    Date of Admission:  02/18/2018               Reason for Consult: Meningitis  Referring Provider: Nori Riis Primary Care Provider: Everrett Coombe, MD   Assessment/Plan:  27 y/o female with recently returning from several months in [place], [place] with new onset fevers, neck pain, and malaise. Her CSF appears consistent with a viral meningitis. Her last CD4 count was 340 and has been compliant with her Symtuza making opportunistic infection like cryptococcus or toxoplasma unlikely.   Viral meningitis - Discontinue vancomycin and ampicillin. Appears to currently be afebrile. With influenza ordered will continue droplet precautions until resulted. Continue to monitor CSF cultures, HSV and cryptococcus antigen results.  HIV - Appears stable with most recent CD4 count of 340. New CD4 count ordered by primary team and pending. Continue Symtuza.    Active Problems:   Meningitis  . amitriptyline  25 mg Oral QHS  . Darunavir-Cobicisctat-Emtricitabine-Tenofovir Alafenamide  1 tablet Oral Q breakfast  . enoxaparin (LOVENOX) injection  40 mg Subcutaneous Q24H   HPI: Kelsey Rodriguez is a 27 y.o. female who was seen in the ED on 3/7 with the chief complaint of headache, fever and malaise.  [name] has a history of HIV, meningitis and TB recently returned to the [place] after several months in [place] in [place]. Symptoms started about 1 day prior to presentation and experienced fevers up to 101 with associated neck pain. X-ray imaging of the chest showed no active cardiopulmonary disease. A lumbar puncture was performed and CSF culture, HSV DNA, and Cryptococcal antigen were ordered and remain in process. Blood cultures were also obtained with results pending. Her most recent CD4 count was 340 and a viral load of 373 in December 2018. She continues to take [place] and has missed several doses sporadically in the last couple of  months.   CSF results with normal glucose, an increased WBC of 51-66, protein 31-64 and increased lymphocytes with normal neutrophils. She received ampicillin, vancomycin and ceftriaxone in the ED. She has been afebrile since being in the hospital with no leukocytosis.   States she is feeling better and her neck pain has improved since admission. Does have questions about changing her HIV medications.   Review of Systems: Review of Systems  Constitutional: Negative for chills, fever and weight loss.  HENT: Positive for sore throat. Negative for congestion.   Eyes: Negative for blurred vision, double vision, photophobia, pain, discharge and redness.  Respiratory: Negative for cough, shortness of breath and wheezing.   Cardiovascular: Negative for chest pain.  Musculoskeletal: Negative for neck pain.  Skin: Negative for rash.  Neurological: Positive for headaches. Negative for weakness.     Past Medical History:  Diagnosis Date  . 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 Adrenal insufficiency ([place])   . Anemia of chronic disease 03/11/2012  . Bell's palsy 08/26/2013  . 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 Chronic back pain   . Chronic leg pain    bilateral knees, ankles  . Fatigue   . 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 HIV (human immunodeficiency virus infection) ([place]) 02/2012  . Laceration of ankle, right 11/18/2012  . Lumbar radiculopathy   . Pelvic pain   . Reflux esophagitis 03/11/2012  . Tuberculosis   . 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 Vertigo     Social History   Tobacco Use  . Smoking status: Never Smoker  .  Smokeless tobacco: Never Used  Substance Use Topics  . Alcohol use: No    Alcohol/week: 0.0 oz    Comment: socially  . Drug use: No    Family History  Problem Relation Age of Onset  . Heart disease Father        Vague not clearly cardiac    Allergies  Allergen Reactions  . Hydrocodone Itching and Nausea Only    Tolerates Oxycodone  .  Tramadol Itching and Nausea Only    Tolerates oxycodone    OBJECTIVE: Blood pressure 118/72, pulse 90, temperature 98.8 F (37.1 C), temperature source Oral, resp. rate 15, last menstrual period 01/23/2018, SpO2 100 %.  Physical Exam  Constitutional: She is oriented to person, place, and time. No distress.  Pleasant. Lying in bed with head of bed up. Eating breakfast.   Eyes: Conjunctivae are normal. Pupils are equal, round, and reactive to light.  Neck: Neck supple.  No neck tenderness or nuchal rigidity noted.   Cardiovascular: Normal rate, regular rhythm, normal heart sounds and intact distal pulses. Exam reveals no gallop and no friction rub.  No murmur heard. Pulmonary/Chest: Effort normal and breath sounds normal. No respiratory distress. She has no wheezes. She has no rales. She exhibits no tenderness.  Lymphadenopathy:    She has no cervical adenopathy.  Neurological: She is alert and oriented to person, place, and time.  Skin: Skin is warm and dry. No rash noted.  Psychiatric: Mood, memory, affect and judgment normal.    Lab Results Lab Results  Component Value Date   WBC 8.8 02/17/2018   HGB 11.5 (L) 02/17/2018   HCT 34.8 (L) 02/17/2018   MCV 87.7 02/17/2018   PLT 150 02/17/2018    Lab Results  Component Value Date   CREATININE 0.75 02/17/2018   BUN 5 (L) 02/17/2018   NA 133 (L) 02/17/2018   K 3.9 02/17/2018   CL 104 02/17/2018   CO2 18 (L) 02/17/2018    Lab Results  Component Value Date   ALT 15 02/17/2018   AST 22 02/17/2018   ALKPHOS 85 02/17/2018   BILITOT 0.4 02/17/2018     Microbiology: Recent Results (from the past 240 hour(s))  CSF culture     Status: None (Preliminary result)   Collection Time: 02/18/18  6:10 AM  Result Value Ref Range Status   Specimen Description BACK CSF  Final   Special Requests NONE  Final   Gram Stain   Final    WBC PRESENT, PREDOMINANTLY MONONUCLEAR NO ORGANISMS SEEN CYTOSPIN SMEAR Performed at [facility], [address] [place], [place] [address]    Culture PENDING  Incomplete   Report Status PENDING  Incomplete     Terri Piedra, NP [facility] Pager  02/18/2018  9:46 AM

## 2018-02-18 NOTE — Progress Notes (Addendum)
Interim Progress Note:  Evaluated patient this afternoon. She is having some abdominal pain in the left lower quadrant.  Exam General: Well-appearing, laying in bed, in NAD GI: Soft, non-distended, +tenderness to palpation of the LLQ GU: External genitalia normal in appearance, copious amount of thick yellow/green discharge, unable to visualize cervix, +severe cervical motion tenderness  A/P: 1. PID- patient with left lower quadrant pain, recently positive chlamydia, severe cervical motion tenderness. She had a recent pelvic US 02/16/18 that showed some ovarian cysts, but no abscess or fluid collection. - Will treat for PID per up-to-date recommendations: CTX (patient already received 2g CTX in the ED this morning), Doxycycline x 14 days, and Flagyl x 14 days. - Repeat GC/Chlamydia and wet prep performed. Will follow-up results.  Hyman Bible, MD PGY-3

## 2018-02-18 NOTE — Progress Notes (Signed)
FPTS Interim progress:  Patient also with inconsistent history of chlamydia/syphillis/candida/BV.   Patient has positive results in epic but claims negative results when tested in [place].   This is discussed with patient and she consents to retesting/treatment as appropriate.  We will get wet prep/G-C/ and syphillis screening  -Dr. Criss Rosales

## 2018-02-18 NOTE — ED Notes (Signed)
Consent for lumbar puncture signed by patient, Christy Gentles, MD and Jinny Blossom, RN. Pt was explained procedure prior to signing consent. Pt denied any further questions prior to signing consent.

## 2018-02-18 NOTE — H&P (Addendum)
[facility] Admission History and Physical Service Pager: [phone]  Patient name: Kelsey Rodriguez Medical record number: [id number] Date of birth: 05-29-1991 Age: 27 y.o. Gender: female  Primary Care Provider: Everrett Coombe, MD Consultants: ID Code Status: FULL   Chief Complaint: HA, neck pain  Assessment and Plan: Kelsey Rodriguez is a 27 y.o. female presenting with HA, neck pain. PMH is significant for TB, HIV, Avascular necrosis of L hip s/p total hip replacement, abnormal uterine bleeding and episodes of meningitis in the past (last was aseptic meningitis 09/2016).   Headache, neck pain: c/w meningitis, LP by ED MD. Febrile at home to 101, no fever here. Patient with headache, neck pain, sore throat. Denies sick contacts, but recent plane travel. CSF studies in process. Flu PCR also obtained.  In hopes of de-escalating antibiotics, will obtain RVP as patient's symptoms could be viral in nature. Called Dr. Linus Salmons, he does not recommend starting HSV coverage as cell count appears aseptic.  -admit to med surg, airborne precautions, Dr. Nori Riis attending -follow blood culture  -follow CSF studies (cell count, HSV PCR, cryptococcus PCR, culture) -continue ampicillin and vancomycin -ID consulted, appreciate recs  LLQ pain: follows with gyn for this and hx of infertility. Hx of hydrosalpinx on previous US. Repeat US 02/16/18 with bilateral simple ovarian cysts, question functional cysts. Patient with recent wet prep with BV, candid, and chlamydia.  -outpatient gyn f/u -consider retreatment of above as patient states she did not pick these up  HIV: follows with [facility]. Last CD4 count 320 11/2017.  -continue home HIV meds  TB: history of being treated in 2013. CXR without active disease.  -monitor clinically   Anemia: hx of AUB. Presently at baseline -daily CBC  Hyponatremia: mild at 133. Question SIADH in the setting of acute illness.  -trend BMP  daily  Chronic pain: pain in bilateral legs.  -continue home amitriptyline  Reproductive age: upreg pending.  -investigating fertility as an outpatient   Hx of adrenal insufficiency: previously on chronic steroids. No recent charting of this.  -consider steroids if worsening  FEN/GI: normal diet Prophylaxis: lovenox  Disposition: admit to med surg  History of Present Illness:  Kelsey Rodriguez is a 27 y.o. female presenting with headache and neck pain.  She is concerned as this feels somewhat similar to her previous admission for aseptic meningitis.  She states that this started yesterday with fever at home to 101, and she has associated cough, rhinorrhea, headache, sore throat.  Denies shortness of breath.  Additionally endorses left lower quadrant pain for several weeks.  States this was evaluated in [place], and she reports that someone told her it was caused by her womb.  States that she has missed a couple days of her HIV medicines while in [place], but run out of it completely.   Review Of Systems: Per HPI with the following additions: Denies chest pain, changes in bowel movement, dysuria or rash.   ROS  Patient Active Problem List   Diagnosis Date Noted  . Infertility, female 02/12/2018  . Chronic right-sided low back pain with right-sided sciatica 10/21/2017  . 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 Presence of right artificial hip joint 08/24/2017  . Right hip pain 08/24/2017  . Acute right-sided low back pain with right-sided sciatica 08/24/2017  . Complex regional pain syndrome 02/03/2017  . Constipation 02/03/2017  . Irregular menstrual cycle 02/03/2017  . 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 Avascular  necrosis of bone of right hip ([place]) 04/04/2016  . 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 Avascular necrosis of bone of left hip ([place]) 12/14/2015  . 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 Overweight  (BMI 25.0-29.9) 04/20/2015  . Seasonal allergies 03/20/2015  . Menorrhagia 03/19/2015  . Back pain of lumbar region with sciatica 02/12/2015  . Primary adrenal insufficiency ([place]) 01/03/2015  . Laceration of ankle, right 11/18/2012  . 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 Arthralgia 05/20/2012  . HIV (human immunodeficiency virus infection) ([place]) 03/16/2012  . 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 Iron deficiency anemia 03/11/2012  . Reflux esophagitis 03/11/2012    Past Medical History: Past Medical History:  Diagnosis Date  . 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 Adrenal insufficiency ([place])   . Anemia of chronic disease 03/11/2012  . Bell's palsy 08/26/2013  . 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 Chronic back pain   . Chronic leg pain    bilateral knees, ankles  . Fatigue   . 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 HIV (human immunodeficiency virus infection) ([place]) 02/2012  . Laceration of ankle, right 11/18/2012  . Lumbar radiculopathy   . Pelvic pain   . Reflux esophagitis 03/11/2012  . Tuberculosis   . 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 Vertigo     Past Surgical History: Past Surgical History:  Procedure Laterality Date  . APPENDECTOMY  ~ 2000  . DILATION AND CURETTAGE OF UTERUS  2008  . ESOPHAGOGASTRODUODENOSCOPY  03/11/2012   Procedure: ESOPHAGOGASTRODUODENOSCOPY (EGD);  Surgeon: Lafayette Dragon, MD;  Location: [facility] ENDOSCOPY;  Service: Endoscopy;  Laterality: N/A;  . ESOPHAGOGASTRODUODENOSCOPY N/A 03/07/2014   Procedure: ESOPHAGOGASTRODUODENOSCOPY (EGD);  Surgeon: Gatha Mayer, MD;  Location: [facility] ENDOSCOPY;  Service: Endoscopy;  Laterality: N/A;  . LUNG BIOPSY  02/2012  . TOTAL HIP ARTHROPLASTY Left 12/14/2015   Procedure: LEFT TOTAL HIP ARTHROPLASTY ANTERIOR APPROACH;  Surgeon: Mcarthur Rossetti, MD;  Location: [facility] ORS;  Service: Orthopedics;  Laterality: Left;  . TOTAL HIP ARTHROPLASTY Right 04/04/2016   Procedure: RIGHT TOTAL HIP ARTHROPLASTY ANTERIOR  APPROACH;  Surgeon: Mcarthur Rossetti, MD;  Location: [facility] ORS;  Service: Orthopedics;  Laterality: Right;    Social History: Social History   Tobacco Use  . Smoking status: Never Smoker  . Smokeless tobacco: Never Used  Substance Use Topics  . Alcohol use: No    Alcohol/week: 0.0 oz    Comment: socially  . Drug use: No   Additional social history: recently visited her husband in [place]  Please also refer to relevant sections of EMR.  Family History: Family History  Problem Relation Age of Onset  . Heart disease Father        Vague not clearly cardiac    Allergies and Medications: Allergies  Allergen Reactions  . Hydrocodone Itching and Nausea Only    Tolerates Oxycodone  . Tramadol Itching and Nausea Only    Tolerates oxycodone   No current facility-administered medications on file prior to encounter.    Current Outpatient Medications on File Prior to Encounter  Medication Sig Dispense Refill  . acetaminophen (TYLENOL) 325 MG tablet Take 650 mg by mouth every 6 (six) hours as needed for headache.    . Darunavir-Cobicisctat-Emtricitabine-Tenofovir Alafenamide (SYMTUZA) 800-150-200-10 MG TABS Take 1 tablet by mouth daily with breakfast. 30 tablet 6  . amitriptyline (ELAVIL) 25 MG tablet 1/2 pill each bedtime x 1 week, then 1 pill nightly x 1 week, then  1 1/2 pills nightly x 1 week, then 2 pills nightly thereafter. (Patient not taking: Reported on 02/18/2018) 60 tablet 5  . azithromycin (ZITHROMAX) 250 MG tablet Take 4 tablets (1,000 mg total) by mouth once for 1 dose. (Patient not taking: Reported on 02/18/2018) 4 tablet 0  . fluconazole (DIFLUCAN) 150 MG tablet Take 1 tablet (150 mg total) by mouth once for 1 dose. (Patient not taking: Reported on 02/18/2018) 1 tablet 0  . meclizine (ANTIVERT) 32 MG tablet Take 1 tablet (32 mg total) by mouth 3 (three) times daily as needed. If needed for "room spinning" dizziness (Patient not taking: Reported on 02/18/2018) 30 tablet 0  .  metroNIDAZOLE (FLAGYL) 500 MG tablet Take 1 tablet (500 mg total) by mouth 2 (two) times daily. (Patient not taking: Reported on 02/18/2018) 14 tablet 0  . ondansetron (ZOFRAN ODT) 8 MG disintegrating tablet Take 1 tablet (8 mg total) by mouth every 8 (eight) hours as needed for nausea or vomiting. (Patient not taking: Reported on 02/18/2018) 90 tablet 3  . polyethylene glycol powder (GLYCOLAX/MIRALAX) powder Take 17 g by mouth 2 (two) times daily as needed. (Patient not taking: Reported on 02/18/2018) 119 g 1  . promethazine (PHENERGAN) 12.5 MG tablet Take 1 tablet (12.5 mg total) by mouth every 8 (eight) hours as needed for nausea or vomiting. (Patient not taking: Reported on 02/18/2018) 90 tablet 3    Objective: BP 116/75 (BP Location: Right Arm)   Pulse 100   Temp 99.8 F (37.7 C) (Oral)   Resp 16   LMP 01/23/2018   SpO2 100%  Exam: General: well appearing female lying in bed Eyes: EOMI, PEERLA ENTM: mmm, fair dentition Neck: pain with Brudinski sign, tender to palpation over parasternal muscles Cardiovascular: RRR, no murmur Respiratory: CTAB, easy WOB Gastrointestinal: soft, nondistended, mildly TTP over LLQ MSK: normal muscle tone  Derm: no rashes on exposed skin Neuro: CN II - XII grossly intact Psych: mood and affect appropriate  Labs and Imaging: CBC BMET  Recent Labs  Lab 02/17/18 2154  WBC 8.8  HGB 11.5*  HCT 34.8*  PLT 150   Recent Labs  Lab 02/17/18 2154  NA 133*  K 3.9  CL 104  CO2 18*  BUN 5*  CREATININE 0.75  GLUCOSE 93  CALCIUM 9.3     US Pelvis Transvanginal Non-ob (tv Only)  Result Date: 02/16/2018 CLINICAL DATA:  Left lower quadrant pain EXAM: ULTRASOUND PELVIS TRANSVAGINAL TECHNIQUE: Transvaginal ultrasound examination of the pelvis was performed including evaluation of the uterus, ovaries, adnexal regions, and pelvic cul-de-sac. COMPARISON:  09/21/2017 FINDINGS: Uterus Measurements: 8.0 x 3.7 x 4.2 cm. No fibroids or other mass visualized. Endometrium  Thickness: 7 mm in thickness.  No focal abnormality visualized. Right ovary Measurements: 5.2 x 3.9 x 6.2 cm. Simple appearing cyst measures 4.8 x 3.3 x 2.5 cm. Left ovary Measurements: 5.3 x 3.5 x 4.5 cm. Simple appearing cyst measures 3.9 x 2.9 x 2.7 cm, similar to prior study. Other findings:  No abnormal free fluid IMPRESSION: Bilateral simple appearing ovarian cysts, 4.8 cm on the right and 3.9 cm on the left. These are likely functional cysts. These could be followed with repeat ultrasound in 3-6 months to ensure resolution. Electronically Signed   By: Rolm Baptise M.D.   On: 02/16/2018 10:46   Dg Chest Port 1 View  Result Date: 02/18/2018 CLINICAL DATA:  Initial evaluation for acute fever, headache. EXAM: PORTABLE CHEST 1 VIEW COMPARISON:  Prior radiograph from 10/27/2017. FINDINGS:  The cardiac and mediastinal silhouettes are stable in size and contour, and remain within normal limits. The lungs are normally inflated. No airspace consolidation, pleural effusion, or pulmonary edema is identified. There is no pneumothorax. No acute osseous abnormality identified. IMPRESSION: No active disease. Electronically Signed   By: Jeannine Boga M.D.   On: 02/18/2018 05:07     Sela Hilding, MD 02/18/2018, 6:27 AM PGY-2, [place] Intern pager: [phone], text pages welcome

## 2018-02-19 ENCOUNTER — Inpatient Hospital Stay (HOSPITAL_COMMUNITY): Payer: Medicaid Other

## 2018-02-19 DIAGNOSIS — R51 Headache: Secondary | ICD-10-CM

## 2018-02-19 DIAGNOSIS — N739 Female pelvic inflammatory disease, unspecified: Secondary | ICD-10-CM

## 2018-02-19 LAB — RPR: RPR Ser Ql: REACTIVE — AB

## 2018-02-19 LAB — BASIC METABOLIC PANEL
Anion gap: 9 (ref 5–15)
BUN: <5 mg/dL — ABNORMAL LOW (ref 6–20)
CO2: 22 mmol/L (ref 22–32)
Calcium: 8.9 mg/dL (ref 8.9–10.3)
Chloride: 108 mmol/L (ref 101–111)
Creatinine, Ser: 0.71 mg/dL (ref 0.44–1.00)
GFR calc Af Amer: >60 mL/min (ref >60)
GFR calc non Af Amer: >60 mL/min (ref >60)
Glucose, Bld: 81 mg/dL (ref 65–99)
Potassium: 3.7 mmol/L (ref 3.5–5.1)
Sodium: 139 mmol/L (ref 135–145)

## 2018-02-19 LAB — TROPONIN I: Troponin I: <0.03 ng/mL (ref <0.03)

## 2018-02-19 LAB — CBC
HCT: 33.2 % — ABNORMAL LOW (ref 36.0–46.0)
Hemoglobin: 10.8 g/dL — ABNORMAL LOW (ref 12.0–15.0)
MCH: 28.6 pg (ref 26.0–34.0)
MCHC: 32.5 g/dL (ref 30.0–36.0)
MCV: 88.1 fL (ref 78.0–100.0)
Platelets: 123 10*3/uL — ABNORMAL LOW (ref 150–400)
RBC: 3.77 MIL/uL — ABNORMAL LOW (ref 3.87–5.11)
RDW: 17.7 % — ABNORMAL HIGH (ref 11.5–15.5)
WBC: 3.3 10*3/uL — ABNORMAL LOW (ref 4.0–10.5)

## 2018-02-19 LAB — RPR, QUANT+TP ABS (REFLEX)
Rapid Plasma Reagin, Quant: 1:4 {titer} — ABNORMAL HIGH
T Pallidum Abs: NEGATIVE

## 2018-02-19 LAB — T-HELPER CELLS (CD4) COUNT (NOT AT ARMC)
CD4 % Helper T Cell: 14 % — ABNORMAL LOW (ref 33–55)
CD4 T Cell Abs: 210 /uL — ABNORMAL LOW (ref 400–2700)

## 2018-02-19 LAB — FLUORESCENT TREPONEMAL AB(FTA)-IGG-BLD: Fluorescent Treponemal Ab, IgG: NONREACTIVE

## 2018-02-19 MED ORDER — METRONIDAZOLE 500 MG PO TABS: 500.0000 mg | ORAL_TABLET | Freq: Two times a day (BID) | ORAL | 0 refills | Status: AC

## 2018-02-19 MED ORDER — GUAIFENESIN-DM 100-10 MG/5ML PO SYRP
5.0000 mL | ORAL_SOLUTION | ORAL | Status: DC | PRN
  Administered 2018-02-19: 5 mL via ORAL
  Filled 2018-02-19: qty 5

## 2018-02-19 MED ORDER — GUAIFENESIN-DM 100-10 MG/5ML PO SYRP: 5.0000 mL | ORAL_SOLUTION | ORAL | 0 refills | Status: DC | PRN

## 2018-02-19 MED ORDER — DOXYCYCLINE HYCLATE 100 MG PO TABS: 100.0000 mg | ORAL_TABLET | Freq: Two times a day (BID) | ORAL | 0 refills | Status: AC

## 2018-02-19 NOTE — Care Management Note (Signed)
Case Management Note  Patient Details  Name: Kelsey Rodriguez MRN: [id number] Date of Birth: Sep 17, 1991  Subjective/Objective:    Hx ofepisodes ofmeningitis in the past (last was aseptic meningitis 09/2016) admitted for headache and neck pain         Action/Plan: Patient with discharge orders home today. Prior to admission patient lived at home.Will be returning to the same living situation after discharge.  At discharge, patient has transportation home.  No discharge needs noted at this time.  Expected Discharge Date:  02/19/18               Expected Discharge Plan:  Home/Self Care  Discharge planning Services  CM Consult  Status of Service:  Completed, signed off  Kristen Cardinal BSN, RN Nurse Case Manager [employer] 02/19/2018, 3:59 PM

## 2018-02-19 NOTE — Progress Notes (Signed)
Discharge teaching complete. Meds, diet, activity and follow up appointments reviewed and all questions answered. Copy of instructions given to patient and prescriptions sent to pharmacy. 

## 2018-02-19 NOTE — Progress Notes (Signed)
MD notified about patient having chest, back and head pain.

## 2018-02-19 NOTE — Progress Notes (Signed)
Family Medicine Teaching Service Daily Progress Note Intern Pager: [phone]  Patient name: Kelsey Rodriguez Medical record number: [id number] Date of birth: 1991/07/29 Age: 27 y.o. Gender: female  Primary Care Provider: Everrett Coombe, MD Consultants: ID Code Status: full  Pt Overview and Major Events to Date:  Aaliya Bih Rodriguez is a 27 y.o. female presenting with HA, neck pain. PMH is significant for TB, HIV, Avascular necrosis of L hip s/p total hip replacement, abnormal uterine bleeding andepisodes ofmeningitis in the past (last was aseptic meningitis 09/2016).   Assessment and Plan: Headache/neck pain: c/w meningitis.  Afebrile during admission.  Recent travel to [place].  HIV with CD4 count.340  MRI neg for acute process.  Crypto neg.  HSV neg -admit to med-surg, Dr. Nori Riis -blood culture pending -csf culture pending (prelim no organism) -urine culture pending -ID consulted and has signed off  PID- Patient not pregnant.  confirmed by pelvic exam w/ Dr. Brett Albino present.  Confirmed recent chlamydia/syphillis/candida/BV with no confirmed treatment/TOC.  Patient received 2g CTX in ED.  Recent pelvic US with bilateral simple ovarian cysts -doxy x 14 days (3/7- ) -metro x14 days (3/7- ) -repeated G/C, wet prep, RPR, FTA  HIV- last CD4 340, on symtuza -continue home meds  Hx of TB: treated in 2013, CXR and symptoms do not indicate active disease  Anemia: hx of AUB, currently at baseline, astymptomatic -monitor CBC  Chronic pain: home amytryptaline -contineu home meds  Reproductive age: urine preg neg on admission, is attempting pregnancy w/ potential fertility concerns -no intervention needed  Hx of adrenal insufficiency: was on chronic steroids, no recent notes -will monitor for symptoms  FEN/GI: regular diet PPx: lovenox  Disposition: to home once medically cleared  Subjective:  Patient feeling well during morning rounds.  Subsequent chest pain described in interim  progress note  Objective: Temp:  [98 F (36.7 C)-99.4 F (37.4 C)] 98 F (36.7 C) (03/08 0606) Pulse Rate:  [77-98] 77 (03/08 0606) Resp:  [12-16] 15 (03/08 0606) BP: (103-122)/(63-85) 103/63 (03/08 0606) SpO2:  [100 %] 100 % (03/08 0606) Weight:  [156 lb 4.9 oz (70.9 kg)] 156 lb 4.9 oz (70.9 kg) (03/07 2204) Physical Exam: General: well appearing Cardiovascular: RRR, no murmurs Respiratory: CTAB, no wheezes/crackles Abdomen: mild ttp in LLQ, no masses on exam Extremities: no swelling/pain, motion intact, distal pulses normal bilaterally.  No pain with neck flexion  Laboratory: Recent Labs  Lab 02/17/18 2154 02/18/18 0854  WBC 8.8 5.9  HGB 11.5* 11.7*  HCT 34.8* 35.1*  PLT 150 126*   Recent Labs  Lab 02/17/18 2154 02/18/18 0854  NA 133* 136  K 3.9 3.5  CL 104 106  CO2 18* 21*  BUN 5* <5*  CREATININE 0.75 0.71  CALCIUM 9.3 8.6*  PROT 8.1  --   BILITOT 0.4  --   ALKPHOS 85  --   ALT 15  --   AST 22  --   GLUCOSE 93 83      Imaging/Diagnostic Tests: Mr Jeri Cos Wo Contrast  Result Date: 02/18/2018 CLINICAL DATA:  27 year old female with febrile illness and meningitis. HIV. CSF analysis today with abnormally increased CSF white blood cells, predominantly mononuclear cells. EXAM: MRI HEAD WITHOUT AND WITH CONTRAST TECHNIQUE: Multiplanar, multiecho pulse sequences of the brain and surrounding structures were obtained without and with intravenous contrast. CONTRAST:  31mL MULTIHANCE GADOBENATE DIMEGLUMINE 529 MG/ML IV SOLN COMPARISON:  Brain MRI 11/21/2016 and earlier. FINDINGS: Brain: Generalized decreased for age some cerebral volume has  mildly progressed since 2014 (series 7, image 14 today versus series 6, image 12 in 2014). Patchy right periatrial and right greater than left temporal lobe white matter T2 and FLAIR hyperintense lesions have regressed since 2017 with no focal encephalomalacia. No chronic cerebral blood products. Scattered other small nonspecific  subcortical white matter foci of FLAIR hyperintensity are occasionally noted and stable to regressed. No restricted diffusion or evidence of acute infarction. No midline shift, mass effect, evidence of mass lesion, ventriculomegaly, extra-axial collection or acute intracranial hemorrhage. No intraventricular debris. No ependymal enhancement. No abnormal enhancement identified. No dural thickening. No new signal abnormality identified in the brain since 2017. Cervicomedullary junction and pituitary are within normal limits. Vascular: Major intracranial vascular flow voids are stable since 2017. Skull and upper cervical spine: Negative visible cervical spine and spinal cord. Normal bone marrow signal. Sinuses/Orbits: Normal orbits soft tissues. Trace paranasal sinus mucosal thickening. Other: Mastoid air cells are stable since 2017 and well pneumatized. Visible internal auditory structures appear normal. Scalp and face soft tissues appear negative. IMPRESSION: 1. No acute intracranial abnormality to indicate a complicated meningitis. No pachy- or leptomeningeal thickening or enhancement is identified. 2. 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 3. The nonspecific white matter signal abnormality described in 2017 has regressed. No new signal abnormality in the brain. Electronically Signed   By: Genevie Ann M.D.   On: 02/18/2018 14:48   Dg Chest Port 1 View  Result Date: 02/18/2018 CLINICAL DATA:  Initial evaluation for acute fever, headache. EXAM: PORTABLE CHEST 1 VIEW COMPARISON:  Prior radiograph from 10/27/2017. FINDINGS: The cardiac and mediastinal silhouettes are stable in size and contour, and remain within normal limits. The lungs are normally inflated. No airspace consolidation, pleural effusion, or pulmonary edema is identified. There is no pneumothorax. No acute osseous abnormality identified. IMPRESSION: No active disease. Electronically Signed   By:  Jeannine Boga M.D.   On: 02/18/2018 05:07     Sherene Sires, DO 02/19/2018, 6:36 AM PGY-1, [place] Intern pager: [phone], text pages welcome

## 2018-02-19 NOTE — Discharge Summary (Signed)
[facility] Discharge Summary  Patient name: Kelsey Rodriguez Medical record number: [id number] Date of birth: 19-Jul-1991 Age: 27 y.o. Gender: female Date of Admission: 02/18/2018  Date of Discharge: 02/19/18 Admitting Physician: Dickie La, MD  Primary Care Provider: Everrett Coombe, MD Consultants: ID  Indication for Hospitalization: concern for meningitis  Discharge Diagnoses/Problem List:  HIV Hx of syphilis Chlamydia BV PID Hx of adrenal insufficiency Reproductive age Chronic pain Hx of TB  Disposition: to home  Discharge Condition: stable  Discharge Exam: General: well appearing Cardiovascular: RRR, no murmurs Respiratory: CTAB, no wheezes/crackles Abdomen: mild ttp in LLQ, no masses on exam Extremities: no swelling/pain, motion intact, distal pulses normal bilaterally.  No pain with neck flexion  Brief Hospital Course:  Patient with hx of HIV/TB c/o headache, pain with neck flexion for concern of meningitis.  She was treated with CTX/Vanc in ED.  CSF cultures were negative, MRI showed no acute process but with chronic atrophy.  Headache/neck pain was intermittent and only mild during admission.  With no respiratory symptoms throughout admission, hx of TB was unlikely to be contributory.  Patient developed complaint of abdominal pain and had cervical tenderness on pelvic exam and diagnosed with PID. There was a recent positive chlamydia/candida/BV result from her outpatient obgyn that she had not picked up Rx for.   She was discharged with Rx to treat for PID with 2wks of doxy/metro.  Issues for Follow Up:  1. Patient being treated for PID based on exam and positive hx of chlamydia/BV/candida.  They were discharged with 13 days of metro/doxy 2. Repeat transvaginal US in 3-35months to ensure resolution of bilateral cysts 3. Patient discharged prior to draw of repeat RPR titers.   Please draw at followup  Significant Procedures: lumbar  puncture  Significant Labs and Imaging:  Recent Labs  Lab 02/17/18 2154 02/18/18 0854 02/19/18 0727  WBC 8.8 5.9 3.3*  HGB 11.5* 11.7* 10.8*  HCT 34.8* 35.1* 33.2*  PLT 150 126* 123*   Recent Labs  Lab 02/17/18 2154 02/18/18 0854 02/19/18 0727  NA 133* 136 139  K 3.9 3.5 3.7  CL 104 106 108  CO2 18* 21* 22  GLUCOSE 93 83 81  BUN 5* <5* <5*  CREATININE 0.75 0.71 0.71  CALCIUM 9.3 8.6* 8.9  ALKPHOS 85  --   --   AST 22  --   --   ALT 15  --   --   ALBUMIN 3.8  --   --       Results/Tests Pending at Time of Discharge:   Discharge Medications:  Allergies as of 02/19/2018      Reactions   Hydrocodone Itching, Nausea Only   Tolerates Oxycodone   Tramadol Itching, Nausea Only   Tolerates oxycodone      Medication List    STOP taking these medications   azithromycin 250 MG tablet Commonly known as:  ZITHROMAX   fluconazole 150 MG tablet Commonly known as:  DIFLUCAN   meclizine 32 MG tablet Commonly known as:  ANTIVERT   ondansetron 8 MG disintegrating tablet Commonly known as:  ZOFRAN ODT   promethazine 12.5 MG tablet Commonly known as:  PHENERGAN     TAKE these medications   acetaminophen 325 MG tablet Commonly known as:  TYLENOL Take 650 mg by mouth every 6 (six) hours as needed for headache.   amitriptyline 25 MG tablet Commonly known as:  ELAVIL 1/2 pill each bedtime x 1 week, then 1  pill nightly x 1 week, then 1 1/2 pills nightly x 1 week, then 2 pills nightly thereafter.   Darunavir-Cobicisctat-Emtricitabine-Tenofovir Alafenamide 800-150-200-10 MG Tabs Commonly known as:  SYMTUZA Take 1 tablet by mouth daily with breakfast.   doxycycline 100 MG tablet Commonly known as:  VIBRA-TABS Take 1 tablet (100 mg total) by mouth every 12 (twelve) hours for 13 days.   guaiFENesin-dextromethorphan 100-10 MG/5ML syrup Commonly known as:  ROBITUSSIN DM Take 5 mLs by mouth every 4 (four) hours as needed for cough.   metroNIDAZOLE 500 MG  tablet Commonly known as:  FLAGYL Take 1 tablet (500 mg total) by mouth 2 (two) times daily for 13 days.   polyethylene glycol powder powder Commonly known as:  GLYCOLAX/MIRALAX Take 17 g by mouth 2 (two) times daily as needed.       Discharge Instructions: Please refer to Patient Instructions section of EMR for full details.  Patient was counseled important signs and symptoms that should prompt return to medical care, changes in medications, dietary instructions, activity restrictions, and follow up appointments.   Follow-Up Appointments: Follow-up Information    Everrett Coombe, MD. Call.   Specialty:  Family Medicine Contact information: [place] [address] [phone]           Sherene Sires, DO 02/20/2018, 8:07 PM PGY-1, [place]

## 2018-02-19 NOTE — Progress Notes (Signed)
Interim progress note:  Paged by nursing about chest pain.   Came immediately to asses patient, ordered stat troponin/CXR/ECG  Patient described the pain as timed with coughing which is new to her today.  She has not felt this kind of pain before and describes it as "in " her chest.  It is not reproducable on chest wall palpation.  Not radiating to arm/neck although she does describe neck pain when flexing neck to bring chin to chest.   No visual changes/LOC/dizziness.  No SOB.   Not diaphoretic but anxious appearing.  Physical exam shows her to not be tachycardic/tachypnic/and with no physical DVT symptoms.  She said she was starting to feel better and was eating her lunch bedside when I left the floor.   Will return to assess later and will follow troponins  CXR appeared neg with no indication of aortic dissection ECG showed no stemi Troponin pending, will repeat addition in 3 hrs.

## 2018-02-20 ENCOUNTER — Encounter (HOSPITAL_COMMUNITY): Payer: Self-pay | Admitting: *Deleted

## 2018-02-20 ENCOUNTER — Other Ambulatory Visit: Payer: Self-pay

## 2018-02-20 ENCOUNTER — Encounter: Payer: Self-pay | Admitting: Student in an Organized Health Care Education/Training Program

## 2018-02-20 ENCOUNTER — Emergency Department (HOSPITAL_COMMUNITY)
Admission: EM | Admit: 2018-02-20 | Discharge: 2018-02-20 | Disposition: A | Discharge status: Home / Self Care | Payer: Medicaid Other | Attending: Emergency Medicine | Admitting: Emergency Medicine

## 2018-02-20 DIAGNOSIS — G971 Other reaction to spinal and lumbar puncture: Secondary | ICD-10-CM

## 2018-02-20 DIAGNOSIS — Z79899 Other long term (current) drug therapy: Secondary | ICD-10-CM | POA: Diagnosis not present

## 2018-02-20 DIAGNOSIS — Z96643 Presence of artificial hip joint, bilateral: Secondary | ICD-10-CM | POA: Diagnosis not present

## 2018-02-20 DIAGNOSIS — R51 Headache: Secondary | ICD-10-CM | POA: Diagnosis present

## 2018-02-20 LAB — URINE CULTURE

## 2018-02-20 LAB — GC/CHLAMYDIA PROBE AMP (~~LOC~~) NOT AT ARMC
Chlamydia: NEGATIVE
Neisseria Gonorrhea: NEGATIVE

## 2018-02-20 MED ORDER — SODIUM CHLORIDE 0.9 % IV BOLUS (SEPSIS)
1000.0000 mL | Freq: Once | INTRAVENOUS | Status: AC
  Administered 2018-02-20: 1000 mL via INTRAVENOUS

## 2018-02-20 MED ORDER — BUTALBITAL-APAP-CAFFEINE 50-325-40 MG PO TABS: 1.0000 | ORAL_TABLET | Freq: Four times a day (QID) | ORAL | 0 refills | Status: DC | PRN

## 2018-02-20 MED ORDER — BUTALBITAL-APAP-CAFFEINE 50-325-40 MG PO TABS
2.0000 | ORAL_TABLET | Freq: Once | ORAL | Status: AC
Start: 2018-02-20 — End: 2018-02-20
  Administered 2018-02-20: 2 via ORAL
  Filled 2018-02-20: qty 2

## 2018-02-20 MED ORDER — LIDOCAINE HCL (PF) 4 % IJ SOLN
4.0000 mL | Freq: Once | INTRAMUSCULAR | Status: AC
  Administered 2018-02-20: 4 mL
  Filled 2018-02-20: qty 5

## 2018-02-20 MED ORDER — SODIUM CHLORIDE 0.9 % IV SOLN: 1000.0000 mL | INTRAVENOUS | Status: DC

## 2018-02-20 NOTE — ED Provider Notes (Signed)
[place] [facility] EMERGENCY DEPARTMENT Provider Note   CSN: [id number] Arrival date & time: 02/20/18  6144     History   Chief Complaint Chief Complaint  Patient presents with  . Headache    HPI Kelsey Rodriguez is a 27 y.o. female.  HPI Patient was admitted to the hospital for severe headache with fever.  She was discharged yesterday.  She had a lumbar puncture on 3\7.  Patient reports her headache was not as severe yesterday when she was discharged.  She reports last night and today it has gotten very intense.  It is diffuse pressure and throbbing particularly behind her forehead.  She reports if she is lying flat is tolerable.  If she sits up or tries to walk the headache becomes very severe.  Some nausea no vomiting.  No focal weakness numbness or tingling. Past Medical History:  Diagnosis Date  . 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 Adrenal insufficiency ([place])   . Anemia of chronic disease 03/11/2012  . Bell's palsy 08/26/2013  . 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 Chronic back pain   . Chronic leg pain    bilateral knees, ankles  . Fatigue   . 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 HIV (human immunodeficiency virus infection) ([place]) 02/2012  . Laceration of ankle, right 11/18/2012  . Lumbar radiculopathy   . Pelvic pain   . Reflux esophagitis 03/11/2012  . Tuberculosis   . 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 Vertigo     Patient Active Problem List   Diagnosis Date Noted  . Meningitis 02/18/2018  . Tuberculosis   . Fever   . Infertility, female 02/12/2018  . Chronic right-sided low back pain with right-sided sciatica 10/21/2017  . 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 Presence of right artificial hip joint 08/24/2017  . Right hip pain 08/24/2017  . Acute right-sided low back pain with right-sided sciatica 08/24/2017  . Complex regional pain syndrome 02/03/2017  . Constipation 02/03/2017  . Irregular menstrual cycle  02/03/2017  . 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 Avascular necrosis of bone of right hip ([place]) 04/04/2016  . 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 Avascular necrosis of bone of left hip ([place]) 12/14/2015  . 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 Overweight (BMI 25.0-29.9) 04/20/2015  . Seasonal allergies 03/20/2015  . Menorrhagia 03/19/2015  . Back pain of lumbar region with sciatica 02/12/2015  . Primary adrenal insufficiency ([place]) 01/03/2015  . Laceration of ankle, right 11/18/2012  . 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 Arthralgia 05/20/2012  . HIV (human immunodeficiency virus infection) ([place]) 03/16/2012  . 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 Iron deficiency anemia 03/11/2012  . Reflux esophagitis 03/11/2012    Past Surgical History:  Procedure Laterality Date  . APPENDECTOMY  ~ 2000  . DILATION AND CURETTAGE OF UTERUS  2008  . ESOPHAGOGASTRODUODENOSCOPY  03/11/2012   Procedure: ESOPHAGOGASTRODUODENOSCOPY (EGD);  Surgeon: Lafayette Dragon, MD;  Location: [facility] ENDOSCOPY;  Service: Endoscopy;  Laterality: N/A;  . ESOPHAGOGASTRODUODENOSCOPY N/A 03/07/2014   Procedure: ESOPHAGOGASTRODUODENOSCOPY (EGD);  Surgeon: Gatha Mayer, MD;  Location: [facility] ENDOSCOPY;  Service: Endoscopy;  Laterality: N/A;  . LUNG BIOPSY  02/2012  . TOTAL HIP ARTHROPLASTY Left 12/14/2015   Procedure: LEFT TOTAL HIP ARTHROPLASTY ANTERIOR APPROACH;  Surgeon: Mcarthur Rossetti, MD;  Location: [facility] ORS;  Service: Orthopedics;  Laterality: Left;  . TOTAL HIP ARTHROPLASTY Right 04/04/2016   Procedure: RIGHT TOTAL  HIP ARTHROPLASTY ANTERIOR APPROACH;  Surgeon: Mcarthur Rossetti, MD;  Location: [facility] ORS;  Service: Orthopedics;  Laterality: Right;    OB History    Gravida Para Term Preterm AB Living   1 0 0 0 1 0   SAB TAB Ectopic Multiple Live Births   1 0 0 0         Home Medications    Prior to Admission medications   Medication Sig Start Date End Date Taking?  Authorizing Provider  acetaminophen (TYLENOL) 325 MG tablet Take 650 mg by mouth every 6 (six) hours as needed for headache.   Yes [provider]  Darunavir-Cobicisctat-Emtricitabine-Tenofovir Alafenamide (SYMTUZA) 800-150-200-10 MG TABS Take 1 tablet by mouth daily with breakfast. 11/17/17  Yes Carlyle Basques, MD  doxycycline (VIBRA-TABS) 100 MG tablet Take 1 tablet (100 mg total) by mouth every 12 (twelve) hours for 13 days. 02/19/18 03/04/18 Yes Bland, Scott, DO  metroNIDAZOLE (FLAGYL) 500 MG tablet Take 1 tablet (500 mg total) by mouth 2 (two) times daily for 13 days. 02/19/18 03/04/18 Yes Bland, Scott, DO  amitriptyline (ELAVIL) 25 MG tablet 1/2 pill each bedtime x 1 week, then 1 pill nightly x 1 week, then 1 1/2 pills nightly x 1 week, then 2 pills nightly thereafter. Patient not taking: Reported on 02/18/2018 01/07/17   Star Age, MD  butalbital-acetaminophen-caffeine (FIORICET, ESGIC) [phone] MG tablet Take 1-2 tablets by mouth every 6 (six) hours as needed for headache. 02/20/18 02/20/19  Charlesetta Shanks, MD  guaiFENesin-dextromethorphan (ROBITUSSIN DM) 100-10 MG/5ML syrup Take 5 mLs by mouth every 4 (four) hours as needed for cough. Patient not taking: Reported on 02/20/2018 02/19/18   Sherene Sires, DO  polyethylene glycol powder (GLYCOLAX/MIRALAX) powder Take 17 g by mouth 2 (two) times daily as needed. Patient not taking: Reported on 02/18/2018 02/03/17   Everrett Coombe, MD    Family History Family History  Problem Relation Age of Onset  . Heart disease Father        Vague not clearly cardiac    Social History Social History   Tobacco Use  . Smoking status: Never Smoker  . Smokeless tobacco: Never Used  Substance Use Topics  . Alcohol use: No    Alcohol/week: 0.0 oz    Comment: socially  . Drug use: No     Allergies   Hydrocodone and Tramadol   Review of Systems Review of Systems 10 Systems reviewed and are negative for acute change except as noted in the  HPI.   Physical Exam Updated Vital Signs BP 116/78 (BP Location: Left Arm)   Pulse 70   Temp 98 F (36.7 C) (Oral)   Resp 16   LMP 01/23/2018   SpO2 100%   Physical Exam  Constitutional: She is oriented to person, place, and time.  Patient is alert and nontoxic.  She does appear to be in moderate to severe pain.  Status is clear.  HENT:  Head: Normocephalic and atraumatic.  Right Ear: External ear normal.  Left Ear: External ear normal.  Nose: Nose normal.  Mouth/Throat: Oropharynx is clear and moist.  Eyes: EOM are normal. Pupils are equal, round, and reactive to light.  Neck: Neck supple.  Cardiovascular: Normal rate, regular rhythm and normal heart sounds.  Pulmonary/Chest: Effort normal and breath sounds normal.  Abdominal: Soft. She exhibits no distension. There is no tenderness.  Musculoskeletal: Normal range of motion. She exhibits no edema or tenderness.  Neurological: She is alert and oriented to person, place, and time.  No cranial nerve deficit. She exhibits normal muscle tone. Coordination normal.  Skin: Skin is warm and dry.  Psychiatric: She has a normal mood and affect.     ED Treatments / Results  Labs (all labs ordered are listed, but only abnormal results are displayed) Labs Reviewed - No data to display  EKG  EKG Interpretation None       Radiology Mr Jeri Cos Wo Contrast  Result Date: 02/18/2018 CLINICAL DATA:  27 year old female with febrile illness and meningitis. HIV. CSF analysis today with abnormally increased CSF white blood cells, predominantly mononuclear cells. EXAM: MRI HEAD WITHOUT AND WITH CONTRAST TECHNIQUE: Multiplanar, multiecho pulse sequences of the brain and surrounding structures were obtained without and with intravenous contrast. CONTRAST:  48mL MULTIHANCE GADOBENATE DIMEGLUMINE 529 MG/ML IV SOLN COMPARISON:  Brain MRI 11/21/2016 and earlier. FINDINGS: Brain: Generalized decreased for age some cerebral volume has mildly progressed  since 2014 (series 7, image 14 today versus series 6, image 12 in 2014). Patchy right periatrial and right greater than left temporal lobe white matter T2 and FLAIR hyperintense lesions have regressed since 2017 with no focal encephalomalacia. No chronic cerebral blood products. Scattered other small nonspecific subcortical white matter foci of FLAIR hyperintensity are occasionally noted and stable to regressed. No restricted diffusion or evidence of acute infarction. No midline shift, mass effect, evidence of mass lesion, ventriculomegaly, extra-axial collection or acute intracranial hemorrhage. No intraventricular debris. No ependymal enhancement. No abnormal enhancement identified. No dural thickening. No new signal abnormality identified in the brain since 2017. Cervicomedullary junction and pituitary are within normal limits. Vascular: Major intracranial vascular flow voids are stable since 2017. Skull and upper cervical spine: Negative visible cervical spine and spinal cord. Normal bone marrow signal. Sinuses/Orbits: Normal orbits soft tissues. Trace paranasal sinus mucosal thickening. Other: Mastoid air cells are stable since 2017 and well pneumatized. Visible internal auditory structures appear normal. Scalp and face soft tissues appear negative. IMPRESSION: 1. No acute intracranial abnormality to indicate a complicated meningitis. No pachy- or leptomeningeal thickening or enhancement is identified. 2. 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 3. The nonspecific white matter signal abnormality described in 2017 has regressed. No new signal abnormality in the brain. Electronically Signed   By: Genevie Ann M.D.   On: 02/18/2018 14:48   Dg Chest Port 1 View  Result Date: 02/19/2018 CLINICAL DATA:  Chest pain. EXAM: PORTABLE CHEST 1 VIEW COMPARISON:  Radiograph of February 18, 2018. FINDINGS: The heart size and mediastinal contours are within normal limits.  Both lungs are clear. No pneumothorax or pleural effusion is noted. The visualized skeletal structures are unremarkable. IMPRESSION: No acute cardiopulmonary abnormality seen. Electronically Signed   By: Marijo Conception, M.D.   On: 02/19/2018 12:32    Procedures .Nerve Block Date/Time: 02/20/2018 2:12 PM Performed by: Charlesetta Shanks, MD Authorized by: Charlesetta Shanks, MD   Consent:    Consent obtained:  Verbal   Consent given by:  Patient Indications:    Indications:  Pain relief Location:    Body area:  Head Skin anesthesia (see MAR for exact dosages):    Skin anesthesia method:  Topical application Procedure details (see MAR for exact dosages):    Injection procedure:  Anatomic landmarks identified Post-procedure details:    Outcome:  Pain relieved   Patient tolerance of procedure:  Tolerated well, no immediate complications Comments:     Sphenopalatine ganglion block.  2 cotton swabs saturated with  4% lidocaine.  These were inserted into the posterior nasopharynx and allowed to remain for 10 minutes.  Subsequently removed. Patient perceived pain improvement.  She was subsequently up and ambulatory without distress.   (including critical care time)  Medications Ordered in ED Medications  sodium chloride 0.9 % bolus 1,000 mL (1,000 mLs Intravenous New Bag/Given 02/20/18 1310)    Followed by  0.9 %  sodium chloride infusion (not administered)  lidocaine (XYLOCAINE) 4 % (PF) injection 4 mL (4 mLs Other Given 02/20/18 1310)  butalbital-acetaminophen-caffeine (FIORICET, ESGIC) 50-325-40 MG per tablet 2 tablet (2 tablets Oral Given 02/20/18 1212)     Initial Impression / Assessment and Plan / ED Course  I have reviewed the triage vital signs and the nursing notes.  Pertinent labs & imaging results that were available during my care of the patient were reviewed by me and considered in my medical decision making (see chart for details).      Final Clinical Impressions(s) / ED  Diagnoses   Final diagnoses:  Spinal headache  Patient presented with headache consistent with spinal headache.  She had LP day before yesterday.  Headache was not severe at discharge yesterday but by last night had severe pain with standing or walking.  Pain was much better in supine position.  She is otherwise nontoxic with no neurologic deficit or fever.  Sphenopalatine block tried per review of successful pain resolution on up-to-date.  Patient was also given 2 Fioricet orally.  After approximately 1 hour, the patient was up and ambulatory about the emergency department with no distress.  She reported headache is very much improved and tolerable.  She was discharged with Fioricet and instructions to increase fluids and caffeine take orally with rest for the next 48 hours.  ED Discharge Orders        Ordered    butalbital-acetaminophen-caffeine (FIORICET, ESGIC) 50-325-40 MG tablet  Every 6 hours PRN     02/20/18 1353       Charlesetta Shanks, MD 02/20/18 1416

## 2018-02-20 NOTE — ED Triage Notes (Signed)
Pt in c/o continued headache since being discharged from hospital yesterday- admitted to r/o meningitis, pain and nausea has increased since being home last night, pt appears uncomfortable in triage

## 2018-02-20 NOTE — ED Notes (Signed)
Got patient on monitor patient is resting with call bell in reach

## 2018-02-20 NOTE — Discharge Instructions (Signed)
1.  Try to drink plenty of fluids.  Fluids with caffeine may be helpful for your headache.  Take Fioricet 1-2 tablets every 6 hours for pain. 2.  Try to rest a lot in a dark room for the next 2 days. 3.  Follow-up with your family doctor or your infectious disease doctor this week.

## 2018-02-20 NOTE — ED Triage Notes (Signed)
Un able to start IV fluids because of poor Iv access. Order placed for IV team to start IV.

## 2018-02-20 NOTE — ED Triage Notes (Signed)
Pt reports HA is from the LP she had on last admission. Pt reports she did not receive RX for pain at DC yesterday. Pt reports HA worse today than at DC yesterday. Pt is also being treated for PID. Pt reports tsking a dose this AM.

## 2018-02-21 LAB — CSF CULTURE W GRAM STAIN: Culture: NO GROWTH

## 2018-02-21 LAB — CSF CULTURE

## 2018-02-22 ENCOUNTER — Encounter (HOSPITAL_COMMUNITY): Payer: Self-pay

## 2018-02-22 ENCOUNTER — Telehealth: Payer: Self-pay | Admitting: *Deleted

## 2018-02-22 ENCOUNTER — Emergency Department (HOSPITAL_COMMUNITY)
Admission: EM | Admit: 2018-02-22 | Discharge: 2018-02-22 | Disposition: A | Discharge status: Home / Self Care | Payer: Medicaid Other | Attending: Emergency Medicine | Admitting: Emergency Medicine

## 2018-02-22 ENCOUNTER — Other Ambulatory Visit: Payer: Self-pay

## 2018-02-22 DIAGNOSIS — G971 Other reaction to spinal and lumbar puncture: Secondary | ICD-10-CM | POA: Insufficient documentation

## 2018-02-22 DIAGNOSIS — Z79899 Other long term (current) drug therapy: Secondary | ICD-10-CM | POA: Insufficient documentation

## 2018-02-22 DIAGNOSIS — R51 Headache: Secondary | ICD-10-CM | POA: Diagnosis present

## 2018-02-22 DIAGNOSIS — Z21 Asymptomatic human immunodeficiency virus [HIV] infection status: Secondary | ICD-10-CM | POA: Insufficient documentation

## 2018-02-22 MED ORDER — SODIUM CHLORIDE 0.9 % IV SOLN
500.0000 mg | Freq: Once | INTRAVENOUS | Status: AC
  Administered 2018-02-22: 500 mg via INTRAVENOUS
  Filled 2018-02-22: qty 2

## 2018-02-22 MED ORDER — PROCHLORPERAZINE EDISYLATE 5 MG/ML IJ SOLN
10.0000 mg | Freq: Once | INTRAMUSCULAR | Status: AC
  Administered 2018-02-22: 10 mg via INTRAVENOUS
  Filled 2018-02-22: qty 2

## 2018-02-22 MED ORDER — SODIUM CHLORIDE 0.9 % IV BOLUS (SEPSIS)
1000.0000 mL | Freq: Once | INTRAVENOUS | Status: AC
  Administered 2018-02-22: 1000 mL via INTRAVENOUS

## 2018-02-22 MED ORDER — MORPHINE SULFATE (PF) 4 MG/ML IV SOLN
4.0000 mg | Freq: Once | INTRAVENOUS | Status: AC
  Administered 2018-02-22: 4 mg via INTRAVENOUS
  Filled 2018-02-22: qty 1

## 2018-02-22 MED ORDER — KETOROLAC TROMETHAMINE 30 MG/ML IJ SOLN
30.0000 mg | Freq: Once | INTRAMUSCULAR | Status: AC
  Administered 2018-02-22: 30 mg via INTRAVENOUS
  Filled 2018-02-22: qty 1

## 2018-02-22 NOTE — ED Provider Notes (Signed)
[facility] EMERGENCY DEPARTMENT Provider Note   CSN: [id number] Arrival date & time: 02/22/18  1127     History   Chief Complaint Chief Complaint  Patient presents with  . Headache    HPI Daisa Bih Ibizugbe is a 27 y.o. female.  HPI Patient is a 27 year old female who presents the emergency department with headache.  She was recently hospitalized for workup for meningitis and underwent lumbar puncture.  She was seen the emergency department yesterday and received IV medications for possible post spinal headache.  She felt better but she reports shortly after returning home she began having headache again.  She denies nausea and vomiting.  Denies neck pain or neck stiffness.  She reports the headache is now been persistent for several days.  She denies weakness in her arms or legs.  No fevers or chills.  Symptoms are moderate to severe in severity.   Past Medical History:  Diagnosis Date  . 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 Adrenal insufficiency ([place])   . Anemia of chronic disease 03/11/2012  . Bell's palsy 08/26/2013  . 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 Chronic back pain   . Chronic leg pain    bilateral knees, ankles  . Fatigue   . 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 HIV (human immunodeficiency virus infection) ([place]) 02/2012  . Laceration of ankle, right 11/18/2012  . Lumbar radiculopathy   . Pelvic pain   . Reflux esophagitis 03/11/2012  . Tuberculosis   . 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 Vertigo     Patient Active Problem List   Diagnosis Date Noted  . Meningitis 02/18/2018  . Tuberculosis   . Fever   . Infertility, female 02/12/2018  . Chronic right-sided low back pain with right-sided sciatica 10/21/2017  . 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 Presence of right artificial hip joint 08/24/2017  . Right hip pain 08/24/2017  . Acute right-sided low back pain with right-sided sciatica 08/24/2017  . Complex  regional pain syndrome 02/03/2017  . Constipation 02/03/2017  . Irregular menstrual cycle 02/03/2017  . 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 Avascular necrosis of bone of right hip ([place]) 04/04/2016  . 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 Avascular necrosis of bone of left hip ([place]) 12/14/2015  . 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 Overweight (BMI 25.0-29.9) 04/20/2015  . Seasonal allergies 03/20/2015  . Menorrhagia 03/19/2015  . Back pain of lumbar region with sciatica 02/12/2015  . Primary adrenal insufficiency ([place]) 01/03/2015  . Laceration of ankle, right 11/18/2012  . 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 Arthralgia 05/20/2012  . HIV (human immunodeficiency virus infection) ([place]) 03/16/2012  . 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 Iron deficiency anemia 03/11/2012  . Reflux esophagitis 03/11/2012    Past Surgical History:  Procedure Laterality Date  . APPENDECTOMY  ~ 2000  . DILATION AND CURETTAGE OF UTERUS  2008  . ESOPHAGOGASTRODUODENOSCOPY  03/11/2012   Procedure: ESOPHAGOGASTRODUODENOSCOPY (EGD);  Surgeon: Lafayette Dragon, MD;  Location: [facility] ENDOSCOPY;  Service: Endoscopy;  Laterality: N/A;  . ESOPHAGOGASTRODUODENOSCOPY N/A 03/07/2014   Procedure: ESOPHAGOGASTRODUODENOSCOPY (EGD);  Surgeon: Gatha Mayer, MD;  Location: [facility] ENDOSCOPY;  Service: Endoscopy;  Laterality: N/A;  . LUNG BIOPSY  02/2012  . TOTAL HIP ARTHROPLASTY Left 12/14/2015   Procedure: LEFT TOTAL HIP ARTHROPLASTY ANTERIOR APPROACH;  Surgeon: Mcarthur Rossetti, MD;  Location: [facility] ORS;  Service: Orthopedics;  Laterality: Left;  .  TOTAL HIP ARTHROPLASTY Right 04/04/2016   Procedure: RIGHT TOTAL HIP ARTHROPLASTY ANTERIOR APPROACH;  Surgeon: Mcarthur Rossetti, MD;  Location: [facility] ORS;  Service: Orthopedics;  Laterality: Right;    OB History    Gravida Para Term Preterm AB Living   1 0 0 0 1 0   SAB TAB Ectopic Multiple Live Births   1 0 0 0         Home  Medications    Prior to Admission medications   Medication Sig Start Date End Date Taking? Authorizing Provider  acetaminophen (TYLENOL) 325 MG tablet Take 650 mg by mouth every 6 (six) hours as needed for headache.    [provider]  amitriptyline (ELAVIL) 25 MG tablet 1/2 pill each bedtime x 1 week, then 1 pill nightly x 1 week, then 1 1/2 pills nightly x 1 week, then 2 pills nightly thereafter. Patient not taking: Reported on 02/18/2018 01/07/17   Star Age, MD  butalbital-acetaminophen-caffeine (FIORICET, ESGIC) [phone] MG tablet Take 1-2 tablets by mouth every 6 (six) hours as needed for headache. 02/20/18 02/20/19  Charlesetta Shanks, MD  Darunavir-Cobicisctat-Emtricitabine-Tenofovir Alafenamide [facility]) 800-150-200-10 MG TABS Take 1 tablet by mouth daily with breakfast. 11/17/17   Carlyle Basques, MD  doxycycline (VIBRA-TABS) 100 MG tablet Take 1 tablet (100 mg total) by mouth every 12 (twelve) hours for 13 days. 02/19/18 03/04/18  Sherene Sires, DO  guaiFENesin-dextromethorphan (ROBITUSSIN DM) 100-10 MG/5ML syrup Take 5 mLs by mouth every 4 (four) hours as needed for cough. Patient not taking: Reported on 02/20/2018 02/19/18   Sherene Sires, DO  metroNIDAZOLE (FLAGYL) 500 MG tablet Take 1 tablet (500 mg total) by mouth 2 (two) times daily for 13 days. 02/19/18 03/04/18  Sherene Sires, DO  polyethylene glycol powder (GLYCOLAX/MIRALAX) powder Take 17 g by mouth 2 (two) times daily as needed. Patient not taking: Reported on 02/18/2018 02/03/17   Everrett Coombe, MD    Family History Family History  Problem Relation Age of Onset  . Heart disease Father        Vague not clearly cardiac    Social History Social History   Tobacco Use  . Smoking status: Never Smoker  . Smokeless tobacco: Never Used  Substance Use Topics  . Alcohol use: No    Alcohol/week: 0.0 oz    Comment: socially  . Drug use: No     Allergies   Hydrocodone and Tramadol   Review of Systems Review of Systems  All other  systems reviewed and are negative.    Physical Exam Updated Vital Signs BP 113/75 (BP Location: Left Arm)   Pulse 65   Temp 98 F (36.7 C) (Oral)   Resp 20   Wt 70.8 kg (156 lb)   LMP 01/23/2018   SpO2 100%   BMI 25.96 kg/m   Physical Exam  Constitutional: She is oriented to person, place, and time. She appears well-developed and well-nourished.  HENT:  Head: Normocephalic and atraumatic.  Eyes: Pupils are equal, round, and reactive to light.  Cardiovascular: Regular rhythm.  Pulmonary/Chest: Effort normal.  Abdominal: Soft.  Musculoskeletal: Normal range of motion.  Neurological: She is alert and oriented to person, place, and time.  5/5 strength in major muscle groups of  bilateral upper and lower extremities. Speech normal. No facial asymetry.   Nursing note and vitals reviewed.    ED Treatments / Results  Labs (all labs ordered are listed, but only abnormal results are displayed) Labs Reviewed - No data to display  EKG  EKG Interpretation None       Radiology No results found.  Procedures Procedures (including critical care time)  Medications Ordered in ED Medications  sodium chloride 0.9 % bolus 1,000 mL (1,000 mLs Intravenous New Bag/Given 02/22/18 1644)  ketorolac (TORADOL) 30 MG/ML injection 30 mg (30 mg Intravenous Given 02/22/18 1644)  morphine 4 MG/ML injection 4 mg (4 mg Intravenous Given 02/22/18 1644)  caffeine-sodium benzoate ADULT 500 mg in sodium chloride 0.9 % 1,000 mL IVPB (500 mg Intravenous New Bag/Given 02/22/18 1806)  prochlorperazine (COMPAZINE) injection 10 mg (10 mg Intravenous Given 02/22/18 1644)     Initial Impression / Assessment and Plan / ED Course  I have reviewed the triage vital signs and the nursing notes.  Pertinent labs & imaging results that were available during my care of the patient were reviewed by me and considered in my medical decision making (see chart for details).     7:31 PM Resolution of headache in the  emergency department with 1 L normal saline as well as 1 L of fluid with 500 mg of caffeine.  Compazine and Toradol and morphine were given as well.  She feels much better.  She is able to sit up without any difficulty.  She was able to eat.  She is smiling.  She is safe for discharge home.  I do not think she needs a blood patch at this time.  No meningeal signs.  Doubt meningitis.  Final Clinical Impressions(s) / ED Diagnoses   Final diagnoses:  Post lumbar puncture headache    ED Discharge Orders    None       Jola Schmidt, MD 02/22/18 1932

## 2018-02-22 NOTE — ED Triage Notes (Signed)
Pt presents to the ed for continued headache x 5 days. Pt has been seen here before for the same. States she was here and tested for meningitis and it was negative, was told by her primary doctor to come here for possible spinal headache. Alert and oriented, no focal neuro deficits present.

## 2018-02-22 NOTE — Telephone Encounter (Signed)
She is in the ER right now.

## 2018-02-22 NOTE — Telephone Encounter (Signed)
Patient called, stated her headache returned after the treatment in ED on 3/9. She cannot sit up, is nauseated. Cannot eat.  She states her head, neck and back hurt, 10/10.  She states the medicine she was given on 3/9 do not work. RN advised patient will need to go to the hospital for evaluation, especially since she had a lumbar puncture last week. Landis Gandy, RN

## 2018-02-22 NOTE — ED Notes (Signed)
Unsuccessful IV attempt. Other nurse to attempt

## 2018-02-22 NOTE — ED Notes (Signed)
Pt reports taking pain medication prior to arrival

## 2018-02-22 NOTE — Telephone Encounter (Signed)
It sounds like she might have a post LP headache.  Can you check if IR can do a blood patch on her?

## 2018-02-23 LAB — CULTURE, BLOOD (ROUTINE X 2)
Culture: NO GROWTH
Culture: NO GROWTH
Special Requests: ADEQUATE
Special Requests: ADEQUATE

## 2018-02-26 ENCOUNTER — Other Ambulatory Visit: Payer: Self-pay

## 2018-02-26 ENCOUNTER — Encounter: Payer: Self-pay | Admitting: Student in an Organized Health Care Education/Training Program

## 2018-02-26 ENCOUNTER — Ambulatory Visit (INDEPENDENT_AMBULATORY_CARE_PROVIDER_SITE_OTHER): Payer: Medicaid Other | Admitting: Student in an Organized Health Care Education/Training Program

## 2018-02-26 DIAGNOSIS — N730 Acute parametritis and pelvic cellulitis: Secondary | ICD-10-CM | POA: Insufficient documentation

## 2018-02-26 DIAGNOSIS — B20 Human immunodeficiency virus [HIV] disease: Secondary | ICD-10-CM | POA: Diagnosis not present

## 2018-02-26 DIAGNOSIS — A539 Syphilis, unspecified: Secondary | ICD-10-CM | POA: Diagnosis not present

## 2018-02-26 DIAGNOSIS — R519 Headache, unspecified: Secondary | ICD-10-CM

## 2018-02-26 DIAGNOSIS — R51 Headache: Secondary | ICD-10-CM | POA: Diagnosis not present

## 2018-02-26 HISTORY — DX: Syphilis, unspecified: A53.9

## 2018-02-26 HISTORY — DX: Acute parametritis and pelvic cellulitis: N73.0

## 2018-02-26 MED ORDER — CYCLOBENZAPRINE HCL 10 MG PO TABS: 10.0000 mg | ORAL_TABLET | Freq: Three times a day (TID) | ORAL | 0 refills | Status: DC | PRN

## 2018-02-26 MED ORDER — NAPROXEN 500 MG PO TABS: 500.0000 mg | ORAL_TABLET | Freq: Every day | ORAL | 0 refills | Status: DC | PRN

## 2018-02-26 NOTE — Assessment & Plan Note (Signed)
May be lumbar puncture pain, which can persist for up to two weeks vs. Muscle spasm related to the lumbar puncture. Lower concern for meningitis as it has been going on since hospitalization and meningitis was ruled out at that hospital stay. Also, patient is generally well-appearing on exam (though has some back stiffness and discomfort). She is moving her head side to side without rigidity easily during our conversation. No fevers. - flexeril and naproxen for pain - very strict return precautions if she worsens - considered whether she needs to go to the hospital, however she appears well enough to go home with return precautions - recommend weaning caffeine as this may lead to withdrawal headaches

## 2018-02-26 NOTE — Patient Instructions (Signed)
It was a pleasure seeing you today in our clinic. Here is the treatment plan we have discussed and agreed upon together:  Please complete your antibiotic course.  Please schedule follow up with infectious disease as soon as possible.  Please schedule follow up to be seen on Monday in our clinic.   If you develop fevers or worsening of your symptoms please call our office or come in to be reevaluated.  Do not take muscle relaxer before driving. It may cause drowsiness.  We drew blood work at today's visit. I will call or send you a letter with these results. If you do not hear from me within the next week, please give our office a call.  Our clinic's number is [phone]. Please call with questions or concerns about what we discussed today.  Be well, Dr. Burr Medico

## 2018-02-26 NOTE — Assessment & Plan Note (Signed)
Follow up with ID as soon as possible. CD4 210 while hospitalized. Taking ART.

## 2018-02-26 NOTE — Progress Notes (Signed)
  HPI:  Kelsey Rodriguez presents for hospital follow up. Patient was hospitalized from 02/18/2018 to 02/19/2018 with concern for meningitis, which was ruled out by lumbar puncture. She was noted to have recent chlamydia/candida/BV and was treated for PID in the hospital. Additionally titers were notable for RPR, and it appears she received doxycycline and ceftriaxone while hospitalized.  PID - patient reports she almost finished metronidazole/doxycycline - has missed a few doses of metronidazole due to nausea, but she does have nausea medication at home and reports she will take it to help with nausea - denies pelvic pain or vaginal discharge today  Headache - Patient has had persistent headache since leaving the hospital. She does have chronic headaches. This headache she feels may be related to the lumbar puncture. It is better when she lies down. It is frontal and goes to the back of her head. It is associated with back pain down her entire spine. She was seen in the emergency department on 3/9 and 3/11 and was treated with caffeine, which she continues to take 4 times per day for head pain. Ibuprofen has not been helping. She denies fevers though has chills at night. She does endorse sensitivity to light. No blurry vision.  HIV - has not scheduled an appt yet with ID after her hospital stay  Syphillis RPR 1:4 prior to hospital discharge. Was recommended to have this retested at follow up however patient left and forgot to go to the lab. We can retest at follow up.  ROS: See HPI.  [place]: reviewed  PHYSICAL EXAM: BP 104/66   Pulse 75   Temp 98.4 F (36.9 C) (Oral)   Ht 5\' 5"  (1.651 m)   Wt 162 lb (73.5 kg)   LMP 02/20/2018 (Exact Date)   SpO2 99%   BMI 26.96 kg/m  Gen: NAD, well-appearing female, nontoxic appearing HEENT: [place]/AT, PERRL, EOMI, no scleral icterus, no conjunctival injection or pallor. BACK: No cervical spinous tenderness. She is hesitant to bend chin to chest because  she is concerned it will be painful.  Heart: RRR, no m/r/g Lungs: CTA bil, no W/R/R Neuro: CN II-XII intact Ext: moves 4 extremities equally  ASSESSMENT/PLAN:  HIV (human immunodeficiency virus infection) ([place]) Follow up with ID as soon as possible. CD4 210 while hospitalized. Taking ART.  Syphilis Noted on hospital blood work. Looks like she was treated with ceftriaxone and doxycycline in the hospital. Patient left without repeat RPR. Can test at follow up on Monday. Additionally advised patient she needs to follow up with ID as soon as possible.  Headache May be lumbar puncture pain, which can persist for up to two weeks vs. Muscle spasm related to the lumbar puncture. Lower concern for meningitis as it has been going on since hospitalization and meningitis was ruled out at that hospital stay. Also, patient is generally well-appearing on exam (though has some back stiffness and discomfort). She is moving her head side to side without rigidity easily during our conversation. No fevers. - flexeril and naproxen for pain - very strict return precautions if she worsens - considered whether she needs to go to the hospital, however she appears well enough to go home with return precautions - recommend weaning caffeine as this may lead to withdrawal headaches - follow up Monday to make sure she is improving/remains well-appearing  PID (acute pelvic inflammatory disease) Patient completing antibiotic course.   Everrett Coombe, MD, [place]

## 2018-02-26 NOTE — Assessment & Plan Note (Addendum)
Noted on hospital blood work. Looks like she was treated with ceftriaxone and doxycycline in the hospital. Patient left without repeat RPR. Can test at follow up on Monday. Additionally advised patient she needs to follow up with ID as soon as possible.

## 2018-02-26 NOTE — Assessment & Plan Note (Signed)
Patient completing antibiotic course.

## 2018-03-03 ENCOUNTER — Other Ambulatory Visit: Payer: Self-pay

## 2018-03-03 ENCOUNTER — Ambulatory Visit (INDEPENDENT_AMBULATORY_CARE_PROVIDER_SITE_OTHER): Payer: Medicaid Other | Admitting: Pharmacist

## 2018-03-03 ENCOUNTER — Ambulatory Visit (INDEPENDENT_AMBULATORY_CARE_PROVIDER_SITE_OTHER): Payer: Medicaid Other | Admitting: Internal Medicine

## 2018-03-03 ENCOUNTER — Ambulatory Visit: Payer: Medicaid Other | Admitting: Student in an Organized Health Care Education/Training Program

## 2018-03-03 ENCOUNTER — Encounter: Payer: Self-pay | Admitting: Internal Medicine

## 2018-03-03 VITALS — BP 119/70 | HR 72 | Temp 98.2°F | Ht 64.0 in | Wt 163.0 lb

## 2018-03-03 DIAGNOSIS — B20 Human immunodeficiency virus [HIV] disease: Secondary | ICD-10-CM

## 2018-03-03 DIAGNOSIS — Z9119 Patient's noncompliance with other medical treatment and regimen: Secondary | ICD-10-CM | POA: Diagnosis not present

## 2018-03-03 DIAGNOSIS — Z91199 Patient's noncompliance with other medical treatment and regimen due to unspecified reason: Secondary | ICD-10-CM

## 2018-03-03 NOTE — Progress Notes (Signed)
Patient seen by Magda Kiel. Landis Gandy, RN

## 2018-03-03 NOTE — Progress Notes (Signed)
[facility] for [facility] Disease Pharmacy Visit  HPI: Kelsey Rodriguez is a 27 y.o. female who presents to the [facility] clinic today to follow-up for her HIV infection.  She was initially scheduled to see Dr. Baxter Flattery but Dr. Baxter Flattery had to leave so she asked me to see her.  Patient Active Problem List   Diagnosis Date Noted  . Syphilis 02/26/2018  . PID (acute pelvic inflammatory disease) 02/26/2018  . Meningitis 02/18/2018  . Tuberculosis   . Infertility, female 02/12/2018  . Chronic right-sided low back pain with right-sided sciatica 10/21/2017  . 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 Presence of right artificial hip joint 08/24/2017  . Right hip pain 08/24/2017  . Acute right-sided low back pain with right-sided sciatica 08/24/2017  . Complex regional pain syndrome 02/03/2017  . Irregular menstrual cycle 02/03/2017  . Headache 10/28/2016  . 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 Avascular necrosis of bone of right hip ([place]) 04/04/2016  . 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 Avascular necrosis of bone of left hip ([place]) 12/14/2015  . 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 Overweight (BMI 25.0-29.9) 04/20/2015  . Seasonal allergies 03/20/2015  . Menorrhagia 03/19/2015  . Back pain of lumbar region with sciatica 02/12/2015  . Primary adrenal insufficiency ([place]) 01/03/2015  . Laceration of ankle, right 11/18/2012  . 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 Arthralgia 05/20/2012  . HIV (human immunodeficiency virus infection) ([place]) 03/16/2012  . 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 Iron deficiency anemia 03/11/2012  . Reflux esophagitis 03/11/2012    Patient's Medications  New Prescriptions   No medications on file  Previous Medications   ACETAMINOPHEN (TYLENOL) 325 MG TABLET    Take 650 mg by mouth every 6 (six) hours as needed for headache.   AMITRIPTYLINE (ELAVIL) 25 MG TABLET    1/2 pill each bedtime x 1 week, then  1 pill nightly x 1 week, then 1 1/2 pills nightly x 1 week, then 2 pills nightly thereafter.   BUTALBITAL-ACETAMINOPHEN-CAFFEINE (FIORICET, ESGIC) 50-325-40 MG TABLET    Take 1-2 tablets by mouth every 6 (six) hours as needed for headache.   CYCLOBENZAPRINE (FLEXERIL) 10 MG TABLET    Take 1 tablet (10 mg total) by mouth 3 (three) times daily as needed for muscle spasms.   DARUNAVIR-COBICISCTAT-EMTRICITABINE-TENOFOVIR ALAFENAMIDE (SYMTUZA) 800-150-200-10 MG TABS    Take 1 tablet by mouth daily with breakfast.   DOXYCYCLINE (VIBRA-TABS) 100 MG TABLET    Take 1 tablet (100 mg total) by mouth every 12 (twelve) hours for 13 days.   GUAIFENESIN-DEXTROMETHORPHAN (ROBITUSSIN DM) 100-10 MG/5ML SYRUP    Take 5 mLs by mouth every 4 (four) hours as needed for cough.   METRONIDAZOLE (FLAGYL) 500 MG TABLET    Take 1 tablet (500 mg total) by mouth 2 (two) times daily for 13 days.   NAPROXEN (NAPROSYN) 500 MG TABLET    Take 1 tablet (500 mg total) by mouth daily as needed for mild pain or moderate pain.   POLYETHYLENE GLYCOL POWDER (GLYCOLAX/MIRALAX) POWDER    Take 17 g by mouth 2 (two) times daily as needed.  Modified Medications   No medications on file  Discontinued Medications   No medications on file    Allergies: Allergies  Allergen Reactions  . Hydrocodone Itching and Nausea Only    Tolerates Oxycodone  . Tramadol Itching and Nausea Only    Tolerates oxycodone    Past Medical History:  Past Medical History:  Diagnosis Date  . 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 Adrenal insufficiency ([place])   . Anemia of chronic disease 03/11/2012  . Bell's palsy 08/26/2013  . 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 Chronic back pain   . Chronic leg pain    bilateral knees, ankles  . Fatigue   . 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 HIV (human immunodeficiency virus infection) ([place]) 02/2012  . Laceration of ankle, right 11/18/2012  . Lumbar radiculopathy   . Pelvic pain   . Reflux esophagitis 03/11/2012  . Tuberculosis    . 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 Vertigo     Social History: Social History   Socioeconomic History  . Marital status: Married    Spouse name: Not on file  . Number of children: 0  . Years of education: college  . Highest education level: Not on file  Social Needs  . Financial resource strain: Not on file  . Food insecurity - worry: Not on file  . Food insecurity - inability: Not on file  . Transportation needs - medical: Not on file  . Transportation needs - non-medical: Not on file  Occupational History  . Not on file  Tobacco Use  . Smoking status: Never Smoker  . Smokeless tobacco: Never Used  Substance and Sexual Activity  . Alcohol use: No    Alcohol/week: 0.0 oz    Comment: socially  . Drug use: No  . Sexual activity: Not Currently    Partners: Male    Birth control/protection: None    Comment: pt. given condoms  Other Topics Concern  . 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     Drinks about 1 soda a day     Labs: HIV 1 RNA Quant (copies/mL)  Date Value  11/17/2017 373 (H)  09/01/2017 14,700 (H)  04/13/2017 198,000 (H)   CD4 T Cell Abs (/uL)  Date Value  02/18/2018 210 (L)  11/17/2017 320 (L)  09/01/2017 310 (L)   Hep B S Ab (no units)  Date Value  09/13/2015 POS (A)   Hepatitis B Surface Ag (no units)  Date Value  03/13/2012 NEGATIVE   HCV Ab (no units)  Date Value  03/13/2012 NEGATIVE    Lipids:    Component Value Date/Time   CHOL 215 (H) 12/25/2016 1550   TRIG 144 12/25/2016 1550   HDL 67 12/25/2016 1550   CHOLHDL 3.2 12/25/2016 1550   VLDL 29 12/25/2016 1550   LDLCALC 119 (H) 12/25/2016 1550    Current HIV Regimen: Symtuza  Assessment: Kelsey Rodriguez is here today to follow-up for her HIV infection and for a hospital follow-up.  She was recently seen in the hospital for concern of meningitis and was discharged last week. She was recently in [place] for 3 months visiting  her husband and returned February 27th.   She tells me today that she was hospitalized twice while in [place] with headaches and intense back/stomach pain.  She was treated with amoxicillin and flagyl which helped the stomach pain a little but not fully.  Once she got back to the [place], she saw her gynecologist who diagnosed her with PID.  She also states she was told she was infertile. She is hoping to see a fertility doctor soon. She states her gynecologist was trying to set her up with someone at [facility] but they do not take [employer].   She states her  headaches are better and just mild now.  She does still feel pain in her neck when she looks down.  She is also complaining of intense back pain.  She is asking if Dr. Baxter Flattery can order her an xray of her spine. I told her I would ask. She is finishing up a course of doxy that the hospital discharged her on.   She states she did not take her Symtuza for a good period of time while she was in [place].  She states she had 15 tablets left in her pill bottle when she returned home. She has been taking it every day since she has returned here for ~3-4 weeks. She hates taking medication and states just looking at the pill makes her nauseous and nausea medicine does not work. She does not want to check labs today but requests we check in ~2 weeks and schedule her back with Baxter Flattery in ~4 weeks.  Plan: - Continue Symtuza PO daily with food - F/u for labs 4/15 at 2pm - F/u with Dr. Baxter Flattery 5/1 at [place]. Elnita Surprenant, PharmD, [place], [place] for Infectious Disease 03/03/2018, 4:57 PM

## 2018-03-10 ENCOUNTER — Ambulatory Visit (INDEPENDENT_AMBULATORY_CARE_PROVIDER_SITE_OTHER): Payer: Medicaid Other | Admitting: Student in an Organized Health Care Education/Training Program

## 2018-03-10 ENCOUNTER — Encounter: Payer: Self-pay | Admitting: Student in an Organized Health Care Education/Training Program

## 2018-03-10 ENCOUNTER — Other Ambulatory Visit: Payer: Self-pay

## 2018-03-10 VITALS — BP 108/64 | HR 88 | Temp 98.8°F | Ht 64.0 in | Wt 164.4 lb

## 2018-03-10 DIAGNOSIS — M545 Low back pain, unspecified: Secondary | ICD-10-CM

## 2018-03-10 NOTE — Progress Notes (Signed)
Subjective:    Kelsey Rodriguez - 27 y.o. female MRN [id number]  Date of birth: March 29, 1991  HPI  Kelsey Rodriguez is here for follow up of back pain and neck pain.  Patient was recently hospitalized with concerns for aseptic meningitis which was ruled out.  At her hospital follow-up appointment on 3/15 she was continuing to have back pain which she thought may be related to her lumbar puncture.  She follows up today to ensure continued improvement after her hospitalization.  Today she reports that her headache and neck pain are significantly improved and she feels much better than she did when she was last seen in our clinic.  She reports that she has some continued lumbar back pain which is worse at the end of a long day.  It is worse with bending over.  She is unsure whether this could still be related to the lumbar puncture or to the period dysmotility in the hospital.  She asks if it is possible for [place] to get an x-ray of her back today.  Patient does have a history of HIV, and is following with ID.  She reports that her next appointment is in 2 weeks.  Otherwise she has no history of IV drug abuse, no history of steroids, she is not having any fevers and is not have any fecal or urinary incontinence.  ROS see HPI Smoking Status noted.  Health Maintenance:  There are no preventive care reminders to display for this patient.  -  reports that she has never smoked. She has never used smokeless tobacco. - Review of Systems: Per HPI. - Past Medical History: Patient Active Problem List   Diagnosis Date Noted  . Syphilis 02/26/2018  . PID (acute pelvic inflammatory disease) 02/26/2018  . Meningitis 02/18/2018  . Tuberculosis   . Infertility, female 02/12/2018  . Lumbar back pain 10/21/2017  . 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 Presence of right artificial hip joint 08/24/2017  . Right hip pain 08/24/2017  . Acute right-sided low back pain with right-sided  sciatica 08/24/2017  . Complex regional pain syndrome 02/03/2017  . Irregular menstrual cycle 02/03/2017  . Headache 10/28/2016  . 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 Avascular necrosis of bone of right hip ([place]) 04/04/2016  . 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 Avascular necrosis of bone of left hip ([place]) 12/14/2015  . 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 Overweight (BMI 25.0-29.9) 04/20/2015  . Seasonal allergies 03/20/2015  . Menorrhagia 03/19/2015  . Primary adrenal insufficiency ([place]) 01/03/2015  . Laceration of ankle, right 11/18/2012  . 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 Arthralgia 05/20/2012  . HIV (human immunodeficiency virus infection) ([place]) 03/16/2012  . 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 Iron deficiency anemia 03/11/2012  . Reflux esophagitis 03/11/2012   - Medications: reviewed and updated Current Outpatient Medications  Medication Sig Dispense Refill  . acetaminophen (TYLENOL) 325 MG tablet Take 650 mg by mouth every 6 (six) hours as needed for headache.    [name] amitriptyline (ELAVIL) 25 MG tablet 1/2 pill each bedtime x 1 week, then 1 pill nightly x 1 week, then 1 1/2 pills nightly x 1 week, then 2 pills nightly thereafter. (Patient not taking: Reported on 02/18/2018) 60 tablet 5  . butalbital-acetaminophen-caffeine (FIORICET, ESGIC) 50-325-40 MG tablet Take 1-2 tablets by mouth every 6 (six) hours as needed for headache. 20 tablet 0  . cyclobenzaprine (FLEXERIL) 10 MG  tablet Take 1 tablet (10 mg total) by mouth 3 (three) times daily as needed for muscle spasms. 30 tablet 0  . Darunavir-Cobicisctat-Emtricitabine-Tenofovir Alafenamide (SYMTUZA) 800-150-200-10 MG TABS Take 1 tablet by mouth daily with breakfast. 30 tablet 6  . guaiFENesin-dextromethorphan (ROBITUSSIN DM) 100-10 MG/5ML syrup Take 5 mLs by mouth every 4 (four) hours as needed for cough. (Patient not taking: Reported on 02/20/2018) 118 mL 0  . naproxen (NAPROSYN) 500  MG tablet Take 1 tablet (500 mg total) by mouth daily as needed for mild pain or moderate pain. 20 tablet 0  . polyethylene glycol powder (GLYCOLAX/MIRALAX) powder Take 17 g by mouth 2 (two) times daily as needed. (Patient not taking: Reported on 02/18/2018) 119 g 1   No current facility-administered medications for this visit.     Review of Systems See HPI     Objective:   Physical Exam BP 108/64   Pulse 88   Temp 98.8 F (37.1 C) (Oral)   Ht 5\' 4"  (1.626 m)   Wt 164 lb 6.4 oz (74.6 kg)   LMP 02/20/2018 (Exact Date)   SpO2 99%   BMI 28.22 kg/m  Gen: NAD, alert, cooperative with exam, well-appearing  HEENT: no nuchal rigidity, full ROM in neck. Neuro: no gross deficits.  Psych: good insight, alert and oriented Back: Back Exam:  Inspection: Unremarkable  Palpable tenderness: mild tenderness in paraspinal lumbar regions bilaterally Range of Motion:  Flexion 45 deg; Extension 45 deg; Side Bending to 45 deg bilaterally; Rotation to 45 deg bilaterally  Leg strength: Quad: 5/5 Hamstring: 5/5 Hip flexor: 5/5 Hip abductors: 5/5  Strength at foot: Plantar-flexion: 5/5 Dorsi-flexion: 5/5 Eversion: 5/5 Inversion: 5/5  Sensory change: Gross sensation intact to all lumbar and sacral dermatomes.  Reflexes: 2+ at both patellar tendons, Gait unremarkable.     Assessment & Plan:   Lumbar back pain Seems to be musculoskeletal in origin.  Encourage patient to stay active.  She has PT exercises from her previous PT consult which she feels that she can start to do.  Additionally she will use a heating pad.  Explained that we do not typically order back x-rays after only a few weeks of back pain.  She has no red flags on exam. -We will try conservative measures with exercise and heating pads -Follow-up as needed  Everrett Coombe, MD,MS,  PGY2 03/10/2018 4:22 PM

## 2018-03-10 NOTE — Assessment & Plan Note (Signed)
Seems to be musculoskeletal in origin.  Encourage patient to stay active.  She has PT exercises from her previous PT consult which she feels that she can start to do.  Additionally she will use a heating pad.  Explained that we do not typically order back x-rays after only a few weeks of back pain.  She has no red flags on exam. -We will try conservative measures with exercise and heating pads -Follow-up as needed

## 2018-03-10 NOTE — Patient Instructions (Signed)
It was a pleasure seeing you today in our clinic. Here is the treatment plan we have discussed and agreed upon together:  Please try to use exercise and heating pads to help with your back pain  Our clinic's number is [phone]. Please call with questions or concerns about what we discussed today.  Be well, Dr. Burr Medico

## 2018-03-12 ENCOUNTER — Encounter: Payer: Self-pay | Admitting: Student in an Organized Health Care Education/Training Program

## 2018-03-29 ENCOUNTER — Other Ambulatory Visit: Payer: Medicaid Other

## 2018-03-29 ENCOUNTER — Other Ambulatory Visit: Payer: Self-pay | Admitting: *Deleted

## 2018-03-29 DIAGNOSIS — B20 Human immunodeficiency virus [HIV] disease: Secondary | ICD-10-CM

## 2018-03-30 ENCOUNTER — Encounter: Payer: Self-pay | Admitting: Student in an Organized Health Care Education/Training Program

## 2018-03-30 ENCOUNTER — Other Ambulatory Visit: Payer: Self-pay

## 2018-03-30 ENCOUNTER — Other Ambulatory Visit: Payer: Medicaid Other

## 2018-03-30 ENCOUNTER — Ambulatory Visit (INDEPENDENT_AMBULATORY_CARE_PROVIDER_SITE_OTHER): Payer: Medicaid Other | Admitting: Student in an Organized Health Care Education/Training Program

## 2018-03-30 VITALS — BP 106/70 | HR 90 | Temp 98.9°F | Ht 64.0 in | Wt 167.0 lb

## 2018-03-30 DIAGNOSIS — Z3189 Encounter for other procreative management: Secondary | ICD-10-CM | POA: Diagnosis not present

## 2018-03-30 NOTE — Patient Instructions (Signed)
It was a pleasure seeing you today in our clinic. Today we discussed fertility and conception.  Our clinic's number is [phone]. Please call with questions or concerns about what we discussed today.  Be well, Dr. Burr Medico

## 2018-03-30 NOTE — Progress Notes (Signed)
   CC: counseling about conception and fertility  HPI: Kelsey Rodriguez is a 27 y.o. female with PMH significant for HIV, syphilis, PID who presents to [facility] today for fertility counseling.  Patient reports she and her husband attempted pregnancy in June-August 2018 when she was last in [place] visiting her husband who lives there. She was there again trying for pregnancy December through February 2019.   Menstrual history: Previously had abnormal menses and was started on depo provera, which she was on for two years with last injection 01/2017. Since stopping depo provera she has had menses every 28 days, bleeding lasts 4-7 days.  Menses are associated with breast tenderness, cramps, bloating.  Medical history: She has been treated for syphillis, just finished treatment for PID. She also was diagnosed with HIV in 2013 and follows with ID. No known thyroid disease. No known history of hirsutism. She does have a history of dyspareunia last year, not currently.  No history of pregnancy.  Sexually active with one female partner. Partner is in [place]. She goes back to [place] twice per year for 3 months at a time. When she is with her husband she reports being sexually active 4-5 days per week.  No known family history of infertility. No known history of birth defects, genetic mutations, or mental retardation.  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 She does not exercise. Some recent weight gain. No tobacco or alcohol.   Review of Symptoms:  See HPI for ROS.   CC, SH/smoking status, and VS noted.  Objective: BP 106/70   Pulse 90   Temp 98.9 F (37.2 C) (Oral)   Ht 5\' 4"  (1.626 m)   Wt 167 lb (75.8 kg)   LMP 03/18/2018 (Exact Date)   SpO2 98%   BMI 28.67 kg/m  GEN: NAD, alert, cooperative, and pleasant. RESPIRATORY: comfortable work of breathing, normal rate CV: Regular rate noted GI: nondistended SKIN: warm and dry, no rashes or lesions NEURO: II-XII grossly intact PSYCH: AAOx3, appropriate  affect   Assessment and plan:  Encounter for fertility planning Menstrual history, Sexual history, Medical history were obtained above. It seems that patient has been attempting pregnancy with her husband for 3-6 months over the past year. Her situation is complicated by the distance between she and her partner who lives in [place]. She may be at risk for difficulty becoming pregnant in the future due to history of STI's and PID (treated), however I think more time attempting pregnancy is warranted prior to any further workup. Discussed with patient who expresses understanding and agreement with this plan.   Everrett Coombe, MD,MS,  PGY2 03/30/2018 5:10 PM

## 2018-03-30 NOTE — Assessment & Plan Note (Addendum)
Menstrual history, Sexual history, Medical history were obtained above. It seems that patient has been attempting pregnancy with her husband for 3-6 months over the past year. Her situation is complicated by the distance between she and her partner who lives in [place]. She may be at risk for difficulty becoming pregnant in the future due to history of STI's and PID (treated), however I think more time attempting pregnancy is warranted prior to any further workup. Discussed with patient who expresses understanding and agreement with this plan.

## 2018-04-12 ENCOUNTER — Encounter (INDEPENDENT_AMBULATORY_CARE_PROVIDER_SITE_OTHER): Payer: Self-pay | Admitting: Physician Assistant

## 2018-04-12 ENCOUNTER — Ambulatory Visit (INDEPENDENT_AMBULATORY_CARE_PROVIDER_SITE_OTHER): Payer: Medicaid Other | Admitting: Physician Assistant

## 2018-04-12 ENCOUNTER — Ambulatory Visit (INDEPENDENT_AMBULATORY_CARE_PROVIDER_SITE_OTHER): Payer: Medicaid Other

## 2018-04-12 VITALS — Ht 64.0 in | Wt 167.0 lb

## 2018-04-12 DIAGNOSIS — M7062 Trochanteric bursitis, left hip: Secondary | ICD-10-CM

## 2018-04-12 MED ORDER — LIDOCAINE HCL 1 % IJ SOLN
3.0000 mL | INTRAMUSCULAR | Status: AC | PRN
  Administered 2018-04-12: 3 mL

## 2018-04-12 MED ORDER — METHYLPREDNISOLONE ACETATE 40 MG/ML IJ SUSP
40.0000 mg | INTRAMUSCULAR | Status: AC | PRN
  Administered 2018-04-12: 40 mg via INTRA_ARTICULAR

## 2018-04-12 NOTE — Progress Notes (Signed)
   Procedure Note  Patient: Kelsey Rodriguez             Date of Birth: February 06, 1991           MRN: [id number]             Visit Date: 04/12/2018 HAF:[id number] Dr. Ninfa Linden service status post bilateral hip arthroplasties now 2 years status post right total hip and almost 2-1/2 years status post left total hip arthroplasty.  She has had no injury to either hip.  She is having pain now in the left hip in the past 2 weeks deep hip pain.  Pain is worse with walking occasionally feels like the leg is going to give way.  She is having clicking sensation in the right hip is not painful.  She has pain when lying on either hip at night.    Physical exam bilateral hips good range of motion with internal and external rotation.  External rotation of left hip causes review of systems: No fevers chills shortness of breath chest pain.  Discomfort.  She has tenderness over the trochanteric region of both hips left greater than right.  Left hip incision is well-healed no signs of infection.    Radiographs: AP pelvis lateral view left hip Shows well-seated total hip arthroplasties without any signs of complication.  No acute fractures.  Both hips well located.  Procedures: Visit Diagnoses: Trochanteric bursitis of left hip - Plan: XR HIP UNILAT W OR W/O PELVIS 2-3 VIEWS LEFT  Large Joint Inj: L greater trochanter on 04/12/2018 3:32 PM Indications: pain Details: 22 G 1.5 in needle, lateral approach  Arthrogram: No  Medications: 3 mL lidocaine 1 %; 40 mg methylPREDNISolone acetate 40 MG/ML Outcome: tolerated well, no immediate complications Procedure, treatment alternatives, risks and benefits explained, specific risks discussed. Consent was given by the patient. Immediately prior to procedure a time out was called to verify the correct patient, procedure, equipment, support staff and site/side marked as required. Patient was prepped and draped in the usual sterile fashion.     Plan: She shown IT band  stretching exercises.  Like to see her back in 3 weeks to see her response to the injection left hip may consider right hip injection for trochanteric bursitis at that time.  Questions encouraged and answered.

## 2018-04-14 ENCOUNTER — Ambulatory Visit (INDEPENDENT_AMBULATORY_CARE_PROVIDER_SITE_OTHER): Payer: Medicaid Other | Admitting: Internal Medicine

## 2018-04-14 ENCOUNTER — Encounter: Payer: Self-pay | Admitting: Internal Medicine

## 2018-04-14 VITALS — BP 119/81 | HR 77 | Temp 98.1°F | Ht 65.0 in | Wt 170.0 lb

## 2018-04-14 DIAGNOSIS — B20 Human immunodeficiency virus [HIV] disease: Secondary | ICD-10-CM

## 2018-04-14 DIAGNOSIS — Z23 Encounter for immunization: Secondary | ICD-10-CM | POA: Diagnosis not present

## 2018-04-14 DIAGNOSIS — B353 Tinea pedis: Secondary | ICD-10-CM | POA: Diagnosis not present

## 2018-04-14 DIAGNOSIS — B351 Tinea unguium: Secondary | ICD-10-CM

## 2018-04-14 MED ORDER — CLOTRIMAZOLE 1 % EX CREA: 1.0000 "application " | TOPICAL_CREAM | Freq: Two times a day (BID) | CUTANEOUS | 0 refills | Status: DC

## 2018-04-14 NOTE — Progress Notes (Signed)
RFV: follow up for hiv disease  Patient ID: Kelsey Rodriguez, female   DOB: November 28, 1991, 27 y.o.   MRN: [id number]  HPI Kelsey Rodriguez is a 27yo F with hiv disease, CD 4 count, 210/VL 373 (dec 2018) on symtuza. Hx of bilateral hip avn s/p bilateral THA. She recently having trochanteric bursitis s/p steroid injection on 4/29. She is unclear when she is returning to [place]. Her husband resides in [place], they were married in 2018. She has questions to how she can become pregnant. She reports having a normal menstrual cycle, every month. Just starting to use ovulation tracking app on her phone  Ros:  Athlete's foot Toe nail fungus Muscle cramping Otherwise 12 point ros is negative  Outpatient Encounter Medications as of 04/14/2018  Medication Sig  . acetaminophen (TYLENOL) 325 MG tablet Take 650 mg by mouth every 6 (six) hours as needed for headache.  [name] amitriptyline (ELAVIL) 25 MG tablet 1/2 pill each bedtime x 1 week, then 1 pill nightly x 1 week, then 1 1/2 pills nightly x 1 week, then 2 pills nightly thereafter.  . cyclobenzaprine (FLEXERIL) 10 MG tablet Take 1 tablet (10 mg total) by mouth 3 (three) times daily as needed for muscle spasms.  . Darunavir-Cobicisctat-Emtricitabine-Tenofovir Alafenamide (SYMTUZA) 800-150-200-10 MG TABS Take 1 tablet by mouth daily with breakfast.  . naproxen (NAPROSYN) 500 MG tablet Take 1 tablet (500 mg total) by mouth daily as needed for mild pain or moderate pain.  . polyethylene glycol powder (GLYCOLAX/MIRALAX) powder Take 17 g by mouth 2 (two) times daily as needed.  . butalbital-acetaminophen-caffeine (FIORICET, ESGIC) 50-325-40 MG tablet Take 1-2 tablets by mouth every 6 (six) hours as needed for headache. (Patient not taking: Reported on 04/14/2018)  . guaiFENesin-dextromethorphan (ROBITUSSIN DM) 100-10 MG/5ML syrup Take 5 mLs by mouth every 4 (four) hours as needed for cough. (Patient not taking: Reported on 04/14/2018)   No facility-administered  encounter medications on file as of 04/14/2018.      Patient Active Problem List   Diagnosis Date Noted  . Encounter for fertility planning 03/30/2018  . Syphilis 02/26/2018  . PID (acute pelvic inflammatory disease) 02/26/2018  . Meningitis 02/18/2018  . Tuberculosis   . Infertility, female 02/12/2018  . Lumbar back pain 10/21/2017  . 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 Presence of right artificial hip joint 08/24/2017  . Right hip pain 08/24/2017  . Acute right-sided low back pain with right-sided sciatica 08/24/2017  . Complex regional pain syndrome 02/03/2017  . Irregular menstrual cycle 02/03/2017  . Headache 10/28/2016  . 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 Avascular necrosis of bone of right hip ([place]) 04/04/2016  . 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 Avascular necrosis of bone of left hip ([place]) 12/14/2015  . 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 Overweight (BMI 25.0-29.9) 04/20/2015  . Seasonal allergies 03/20/2015  . Menorrhagia 03/19/2015  . Primary adrenal insufficiency ([place]) 01/03/2015  . Laceration of ankle, right 11/18/2012  . 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 Arthralgia 05/20/2012  . HIV (human immunodeficiency virus infection) ([place]) 03/16/2012  . 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 Iron deficiency anemia 03/11/2012  . Reflux esophagitis 03/11/2012   Social History   Tobacco Use  . Smoking status: Never Smoker  . Smokeless tobacco: Never Used  Substance Use Topics  . Alcohol use: No    Alcohol/week: 0.0 oz    Comment: socially     There  are no preventive care reminders to display for this patient.   Review of Systems  Physical Exam   BP 119/81   Pulse 77   Temp 98.1 F (36.7 C) (Oral)   Ht 5' 5"  (1.651 m)   Wt 170 lb (77.1 kg)   LMP 04/11/2018   BMI 28.29 kg/m   Physical Exam  Constitutional:  oriented to person, place, and time. appears well-developed and well-nourished.  No distress.  HENT: [place]/AT, PERRLA, no scleral icterus Mouth/Throat: Oropharynx is clear and moist. No oropharyngeal exudate.  Cardiovascular: Normal rate, regular rhythm and normal heart sounds. Exam reveals no gallop and no friction rub.  No murmur heard.  Pulmonary/Chest: Effort normal and breath sounds normal. No respiratory distress.  has no wheezes.  Neck = supple, no nuchal rigidity Lymphadenopathy: no cervical adenopathy. No axillary adenopathy Skin: discolored toe nails. Bottom of feet flaking skin Psychiatric: a normal mood and affect.  behavior is normal.   Lab Results  Component Value Date   CD4TCELL 14 (L) 02/18/2018   Lab Results  Component Value Date   CD4TABS 210 (L) 02/18/2018   CD4TABS 320 (L) 11/17/2017   CD4TABS 310 (L) 09/01/2017   Lab Results  Component Value Date   HIV1RNAQUANT 373 (H) 11/17/2017   Lab Results  Component Value Date   HEPBSAB POS (A) 09/13/2015   Lab Results  Component Value Date   LABRPR Reactive (A) 02/18/2018    CBC Lab Results  Component Value Date   WBC 3.3 (L) 02/19/2018   RBC 3.77 (L) 02/19/2018   HGB 10.8 (L) 02/19/2018   HCT 33.2 (L) 02/19/2018   PLT 123 (L) 02/19/2018   MCV 88.1 02/19/2018   MCH 28.6 02/19/2018   MCHC 32.5 02/19/2018   RDW 17.7 (H) 02/19/2018   LYMPHSABS 2,270 11/17/2017   MONOABS 630 12/25/2016   EOSABS 180 11/17/2017    BMET Lab Results  Component Value Date   NA 139 02/19/2018   K 3.7 02/19/2018   CL 108 02/19/2018   CO2 22 02/19/2018   GLUCOSE 81 02/19/2018   BUN <5 (L) 02/19/2018   CREATININE 0.71 02/19/2018   CALCIUM 8.9 02/19/2018   GFRNONAA >60 02/19/2018   GFRAA >60 02/19/2018      Assessment and Plan hiv disease = will check labs today. Has hx of inconsistent adherence to medicaiton - will get viral load today  Tinea pedis = - provide topical treatment for fungal infection  Onychomycosis = will try topical ointments OTC first since she has issues with taking  medications by mouth  Ovulation/pregnancy planning - ovulation kit to track her cycles - prenatal vitamins in the few months prior to getting pregnant  Health maintenance - prevnar 13 today

## 2018-04-14 NOTE — Patient Instructions (Addendum)
Can get  Over the counter - ovulation kit to track your cycle and time to have unprotected sex for getting pregnant  For itchy, peeling feet - this is consistent with athlete's foot. Can treat with topical anti-fungal  -- lotrimin - which I have sent in prescription for you  Nail fungus - can try over the counter medication to treat nail fungus try for 3 months then will discuss doing pills

## 2018-04-16 LAB — HIV-1 RNA QUANT-NO REFLEX-BLD
HIV 1 RNA Quant: 95 copies/mL — ABNORMAL HIGH
HIV-1 RNA Quant, Log: 1.98 Log copies/mL — ABNORMAL HIGH

## 2018-05-09 ENCOUNTER — Encounter (HOSPITAL_COMMUNITY): Payer: Self-pay | Admitting: Emergency Medicine

## 2018-05-09 ENCOUNTER — Other Ambulatory Visit: Payer: Self-pay

## 2018-05-09 ENCOUNTER — Emergency Department (HOSPITAL_COMMUNITY)
Admission: EM | Admit: 2018-05-09 | Discharge: 2018-05-09 | Disposition: A | Discharge status: Home / Self Care | Payer: Medicaid Other | Attending: Physician Assistant | Admitting: Physician Assistant

## 2018-05-09 DIAGNOSIS — Z79899 Other long term (current) drug therapy: Secondary | ICD-10-CM | POA: Insufficient documentation

## 2018-05-09 DIAGNOSIS — J029 Acute pharyngitis, unspecified: Secondary | ICD-10-CM | POA: Insufficient documentation

## 2018-05-09 DIAGNOSIS — B309 Viral conjunctivitis, unspecified: Secondary | ICD-10-CM | POA: Diagnosis not present

## 2018-05-09 DIAGNOSIS — H5713 Ocular pain, bilateral: Secondary | ICD-10-CM | POA: Diagnosis present

## 2018-05-09 DIAGNOSIS — Z96643 Presence of artificial hip joint, bilateral: Secondary | ICD-10-CM | POA: Diagnosis not present

## 2018-05-09 DIAGNOSIS — B9789 Other viral agents as the cause of diseases classified elsewhere: Secondary | ICD-10-CM | POA: Diagnosis not present

## 2018-05-09 LAB — GROUP A STREP BY PCR: Group A Strep by PCR: NOT DETECTED

## 2018-05-09 MED ORDER — SULFACETAMIDE SODIUM 10 % OP SOLN: 1.0000 [drp] | OPHTHALMIC | 0 refills | Status: DC

## 2018-05-09 MED ORDER — BUTALBITAL-APAP-CAFFEINE 50-325-40 MG PO TABS
1.0000 | ORAL_TABLET | Freq: Once | ORAL | Status: DC
  Filled 2018-05-09: qty 1

## 2018-05-09 MED ORDER — LIDOCAINE VISCOUS HCL 2 % MT SOLN: 15.0000 mL | OROMUCOSAL | 0 refills | Status: DC | PRN

## 2018-05-09 MED ORDER — KETOROLAC TROMETHAMINE 30 MG/ML IJ SOLN
30.0000 mg | Freq: Once | INTRAMUSCULAR | Status: AC
  Administered 2018-05-09: 30 mg via INTRAMUSCULAR
  Filled 2018-05-09: qty 1

## 2018-05-09 MED ORDER — BENZONATATE 100 MG PO CAPS: 100.0000 mg | ORAL_CAPSULE | Freq: Three times a day (TID) | ORAL | 0 refills | Status: DC

## 2018-05-09 MED ORDER — ACETAMINOPHEN 325 MG PO TABS
650.0000 mg | ORAL_TABLET | Freq: Once | ORAL | Status: AC
  Administered 2018-05-09: 650 mg via ORAL
  Filled 2018-05-09: qty 2

## 2018-05-09 NOTE — Discharge Instructions (Addendum)
Your strep test was negative. Use the eyedrops as directed in both of your eyes. Swish and spit lidocaine solution as directed to help with your sore throat.  Do not swallow.

## 2018-05-09 NOTE — ED Triage Notes (Signed)
Pt. Stated, I woke up with eye pain this morning the sore throat and headache 3 days ago.

## 2018-05-09 NOTE — ED Provider Notes (Signed)
[facility] EMERGENCY DEPARTMENT Provider Note   CSN: [id number] Arrival date & time: 05/09/18  1740     History   Chief Complaint Chief Complaint  Patient presents with  . Eye Problem  . Sore Throat  . Headache    HPI Kelsey Rodriguez is a 27 y.o. female with a past medical history of HIV, who presents to ED for evaluation of 1 day history of bilateral eye redness, purulent drainage.  She also reports 3-day history of sore throat, dry cough.  She also reports intermittent headache for the past 3 days.  She is a history of headaches and states that this feels similar.  Reports mild improvement with Fioricet given.  She is taking Alka-Seltzer with mild improvement in her cough.  No sick contacts with similar symptoms.  Denies any trouble breathing or trouble swallowing, fevers, productive cough, hemoptysis, shortness of breath, vomiting.  HPI  Past Medical History:  Diagnosis Date  . 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 Adrenal insufficiency ([place])   . Anemia of chronic disease 03/11/2012  . Back pain of lumbar region with sciatica 02/12/2015  . Bell's palsy 08/26/2013  . 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 Chronic back pain   . Chronic leg pain    bilateral knees, ankles  . Fatigue   . 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 HIV (human immunodeficiency virus infection) ([place]) 02/2012  . Laceration of ankle, right 11/18/2012  . Lumbar radiculopathy   . Pelvic pain   . Reflux esophagitis 03/11/2012  . Tuberculosis   . 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 Vertigo     Patient Active Problem List   Diagnosis Date Noted  . Encounter for fertility planning 03/30/2018  . Syphilis 02/26/2018  . PID (acute pelvic inflammatory disease) 02/26/2018  . Meningitis 02/18/2018  . Tuberculosis   . Infertility, female 02/12/2018  . Lumbar back pain 10/21/2017  . 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 Presence of right artificial hip joint  08/24/2017  . Right hip pain 08/24/2017  . Acute right-sided low back pain with right-sided sciatica 08/24/2017  . Complex regional pain syndrome 02/03/2017  . Irregular menstrual cycle 02/03/2017  . Headache 10/28/2016  . 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 Avascular necrosis of bone of right hip ([place]) 04/04/2016  . 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 Avascular necrosis of bone of left hip ([place]) 12/14/2015  . 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 Overweight (BMI 25.0-29.9) 04/20/2015  . Seasonal allergies 03/20/2015  . Menorrhagia 03/19/2015  . Primary adrenal insufficiency ([place]) 01/03/2015  . Laceration of ankle, right 11/18/2012  . 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 Arthralgia 05/20/2012  . HIV (human immunodeficiency virus infection) ([place]) 03/16/2012  . 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 Iron deficiency anemia 03/11/2012  . Reflux esophagitis 03/11/2012    Past Surgical History:  Procedure Laterality Date  . APPENDECTOMY  ~ 2000  . DILATION AND CURETTAGE OF UTERUS  2008  . ESOPHAGOGASTRODUODENOSCOPY  03/11/2012   Procedure: ESOPHAGOGASTRODUODENOSCOPY (EGD);  Surgeon: Lafayette Dragon, MD;  Location: [facility] ENDOSCOPY;  Service: Endoscopy;  Laterality: N/A;  . ESOPHAGOGASTRODUODENOSCOPY N/A 03/07/2014   Procedure: ESOPHAGOGASTRODUODENOSCOPY (EGD);  Surgeon: Gatha Mayer, MD;  Location: [facility] ENDOSCOPY;  Service: Endoscopy;  Laterality: N/A;  . LUNG BIOPSY  02/2012  . TOTAL HIP ARTHROPLASTY Left 12/14/2015   Procedure: LEFT TOTAL HIP ARTHROPLASTY ANTERIOR APPROACH;  Surgeon: Mcarthur Rossetti, MD;  Location: [facility] ORS;  Service: Orthopedics;  Laterality: Left;  . TOTAL HIP ARTHROPLASTY Right 04/04/2016   Procedure: RIGHT TOTAL HIP ARTHROPLASTY ANTERIOR APPROACH;  Surgeon: Mcarthur Rossetti, MD;  Location: [facility] ORS;  Service: Orthopedics;  Laterality: Right;     OB History    Gravida  1   Para  0   Term  0   Preterm  0   AB  1    Living  0     SAB  1   TAB  0   Ectopic  0   Multiple  0   Live Births               Home Medications    Prior to Admission medications   Medication Sig Start Date End Date Taking? Authorizing Provider  acetaminophen (TYLENOL) 325 MG tablet Take 650 mg by mouth every 6 (six) hours as needed for headache.    [provider]  amitriptyline (ELAVIL) 25 MG tablet 1/2 pill each bedtime x 1 week, then 1 pill nightly x 1 week, then 1 1/2 pills nightly x 1 week, then 2 pills nightly thereafter. 01/07/17   Star Age, MD  butalbital-acetaminophen-caffeine (FIORICET, ESGIC) 50-325-40 MG tablet Take 1-2 tablets by mouth every 6 (six) hours as needed for headache. Patient not taking: Reported on 04/14/2018 02/20/18 02/20/19  Charlesetta Shanks, MD  clotrimazole (LOTRIMIN) 1 % cream Apply 1 application topically 2 (two) times daily. 04/14/18   Carlyle Basques, MD  cyclobenzaprine (FLEXERIL) 10 MG tablet Take 1 tablet (10 mg total) by mouth 3 (three) times daily as needed for muscle spasms. 02/26/18   Everrett Coombe, MD  Darunavir-Cobicisctat-Emtricitabine-Tenofovir Alafenamide [facility]) 800-150-200-10 MG TABS Take 1 tablet by mouth daily with breakfast. 11/17/17   Carlyle Basques, MD  guaiFENesin-dextromethorphan [facility] DM) 100-10 MG/5ML syrup Take 5 mLs by mouth every 4 (four) hours as needed for cough. Patient not taking: Reported on 04/14/2018 02/19/18   Sherene Sires, DO  lidocaine (XYLOCAINE) 2 % solution Use as directed 15 mLs in the mouth or throat as needed for mouth pain. 05/09/18   Hero Kulish, PA-C  naproxen (NAPROSYN) 500 MG tablet Take 1 tablet (500 mg total) by mouth daily as needed for mild pain or moderate pain. 02/26/18   Everrett Coombe, MD  polyethylene glycol powder (GLYCOLAX/MIRALAX) powder Take 17 g by mouth 2 (two) times daily as needed. 02/03/17   Everrett Coombe, MD  sulfacetamide (BLEPH-10) 10 % ophthalmic solution Place 1-2 drops into both eyes every 4 (four) hours. 05/09/18    Delia Heady, PA-C    Family History Family History  Problem Relation Age of Onset  . Heart disease Father        Vague not clearly cardiac    Social History Social History   Tobacco Use  . Smoking status: Never Smoker  . Smokeless tobacco: Never Used  Substance Use Topics  . Alcohol use: No    Alcohol/week: 0.0 oz    Comment: socially  . Drug use: No     Allergies   Hydrocodone and Tramadol   Review of Systems Review of Systems  Constitutional: Negative for chills and fever.  HENT: Positive for congestion and sore throat. Negative for mouth sores, rhinorrhea and sinus pain.   Eyes: Positive for discharge, redness and itching. Negative for photophobia, pain and visual disturbance.  Respiratory: Positive for cough.   Gastrointestinal: Negative for vomiting.  Musculoskeletal: Negative for neck pain.  Neurological: Positive for headaches. Negative for weakness, light-headedness  and numbness.     Physical Exam Updated Vital Signs BP 111/83 (BP Location: Right Arm)   Pulse 88   Temp 97.8 F (36.6 C) (Oral)   Resp 16   LMP 04/11/2018   SpO2 100%   Physical Exam  Constitutional: She is oriented to person, place, and time. She appears well-developed and well-nourished. No distress.  HENT:  Head: Normocephalic and atraumatic.  Right Ear: A middle ear effusion is present.  Left Ear: A middle ear effusion is present.  Nose: Nose normal.  Mouth/Throat: Uvula is midline and oropharynx is clear and moist.  Patient does not appear to be in acute distress. No trismus or drooling present. No pooling of secretions. Patient is tolerating secretions and is not in respiratory distress. No neck pain or tenderness to palpation of the neck. Full active and passive range of motion of the neck. No evidence of RPA or PTA.  Eyes: Pupils are equal, round, and reactive to light. EOM are normal. Right conjunctiva is injected. Left conjunctiva is injected. No scleral icterus.  Bilateral  eyes with injected conjunctivae, no eyelid swelling or erythema or tenderness to palpation.  Mild clear tearful drainage noted.  No foreign bodies noted.  No pain with EOMs.  No chemosis, proptosis, or consensual photophobia.  Neck: Normal range of motion.  Cardiovascular: Normal rate, regular rhythm and normal heart sounds.  Pulmonary/Chest: Effort normal and breath sounds normal. No respiratory distress.  Neurological: She is alert and oriented to person, place, and time. No cranial nerve deficit or sensory deficit. She exhibits normal muscle tone.  Pupils reactive. No facial asymmetry noted. Cranial nerves appear grossly intact. Sensation intact to light touch on face, BUE and BLE.  Skin: No rash noted. She is not diaphoretic.  Psychiatric: She has a normal mood and affect.  Nursing note and vitals reviewed.    ED Treatments / Results  Labs (all labs ordered are listed, but only abnormal results are displayed) Labs Reviewed  GROUP A STREP BY PCR    EKG None  Radiology No results found.  Procedures Procedures (including critical care time)  Medications Ordered in ED Medications  acetaminophen (TYLENOL) tablet 650 mg (650 mg Oral Given 05/09/18 1237)  ketorolac (TORADOL) 30 MG/ML injection 30 mg (30 mg Intramuscular Given 05/09/18 1237)     Initial Impression / Assessment and Plan / ED Course  I have reviewed the triage vital signs and the nursing notes.  Pertinent labs & imaging results that were available during my care of the patient were reviewed by me and considered in my medical decision making (see chart for details).     Patient presents to ED for evaluation of multiple complaints.  She reports 1 day history of bilateral eye redness and purulent drainage.  She also reports 3-day history of sore throat, dry cough and intermittent headache.  Headache feels similar to her prior headaches which usually improved with Fioricet.  Chest pain, shortness of breath, hemoptysis  or productive cough noted.  No trouble moving neck, drooling or trismus.  On physical exam she is overall well-appearing.  No signs of RPA or PTA on examination of posterior oropharynx.  No deficits on neurological exam noted.  Lungs are clear to auscultation bilaterally.  Strep test is negative.  Evidence of viral conjunctivitis and bilateral eyes with no signs of iritis, keratitis, corneal abrasion.  Suspect tension type headache as a cause of her headache.  Suspect viral cause of sore throat and cough.  Will give  antitussives, lidocaine to swish and spit, acetamide drops and advised to follow-up with wellness Center for further evaluation if symptoms persist. There are no headache characteristics that are lateralizing or concerning for increased ICP, infectious or vascular cause of her symptoms.  Advised to return to ED for any severe worsening symptoms.  Portions of this note were generated with [employer]. Dictation errors may occur despite best attempts at proofreading.   Final Clinical Impressions(s) / ED Diagnoses   Final diagnoses:  Acute viral conjunctivitis of both eyes  Viral pharyngitis    ED Discharge Orders        Ordered    sulfacetamide (BLEPH-10) 10 % ophthalmic solution  Every 4 hours     05/09/18 1254    lidocaine (XYLOCAINE) 2 % solution  As needed     05/09/18 1254       Delia Heady, PA-C 05/09/18 1258    [place], Fredia Sorrow, MD 05/09/18 2035

## 2018-05-11 ENCOUNTER — Telehealth: Payer: Self-pay

## 2018-05-11 ENCOUNTER — Ambulatory Visit (INDEPENDENT_AMBULATORY_CARE_PROVIDER_SITE_OTHER): Payer: Medicaid Other | Admitting: Family Medicine

## 2018-05-11 ENCOUNTER — Other Ambulatory Visit: Payer: Self-pay

## 2018-05-11 ENCOUNTER — Encounter: Payer: Self-pay | Admitting: Family Medicine

## 2018-05-11 DIAGNOSIS — R51 Headache: Secondary | ICD-10-CM | POA: Diagnosis not present

## 2018-05-11 DIAGNOSIS — R519 Headache, unspecified: Secondary | ICD-10-CM

## 2018-05-11 DIAGNOSIS — J029 Acute pharyngitis, unspecified: Secondary | ICD-10-CM | POA: Diagnosis not present

## 2018-05-11 MED ORDER — NAPROXEN 500 MG PO TABS: 500.0000 mg | ORAL_TABLET | Freq: Every day | ORAL | 0 refills | Status: DC | PRN

## 2018-05-11 NOTE — Assessment & Plan Note (Signed)
The most likely diagnosis is viral syndrome   I have considered and concluded the patient has a very low likelihood of having: Throat/Neck Cancer, GERD, Peritonsillar abscess, Candidiasis, Gonorrhea, Epiglottitis, Herpes Simplex,  Thyroiditis.   Her HIV is well controlled and she does not seem to have any focal or systemic bacterial infection Will treat symptomatically

## 2018-05-11 NOTE — Patient Instructions (Signed)
It was good to see you today.  Thank you for coming in.  I think you have Viral syndrome sore throat .      You should be better in 7 days   If you are not better by then or if you have high fever or cant' swallow or rash or feeling worse then come back to see [place]  Things to do to help you feel better Nasal saline at night to keep throat moist Chloraseptic Throat Spray over the counter Naprosyn twice a day as needed Tylenol every 6 hours as needed   Be Well

## 2018-05-11 NOTE — Progress Notes (Signed)
Subjective  Kelsey Rodriguez is a 27 y.o. female is presenting with the following  SORE THROAT AND HEADACHE  For about 5 days.  Seen in ER two days ago.  Most distressing symptom is ST at night.  Has headache with this but similar to headache in past.  No fever or nausea and vomiting or drooling or rash or stiff neck or swollen nodes.  Chief Complaint noted Review of Symptoms - see HPI PMH - Smoking status noted.   Treated HIV. Takes medications regularly.  HO - adrenal insuff (last mentioned in 2016, does not take regular steroids)   Objective Vital Signs reviewed BP 126/76   Pulse 96   Temp 98.4 F (36.9 C) (Oral)   Ht 5\' 5"  (1.651 m)   Wt 171 lb (77.6 kg)   LMP 04/11/2018   SpO2 99%   BMI 28.46 kg/m  Alert cooperative no acute distress Normal swallowing and breathing Lungs:  Normal respiratory effort, chest expands symmetrically. Lungs are clear to auscultation, no crackles or wheezes. Heart - Regular rate and rhythm.  No murmurs, gallops or rubs.    Neck:  No deformities, thyromegaly, masses, or tenderness noted.   Supple with full range of motion without pain. Skin:  Intact without suspicious lesions or rashes Abdomen: soft and non-tender without masses, organomegaly or hernias noted.  No guarding or rebound Throat: normal mucosa, no exudate, uvula midline, no redness Mouth - no lesions, mucous membranes are moist, no decaying teeth   Ears:  External ear exam shows no significant lesions or deformities.  Otoscopic examination reveals clear canals, tympanic membranes are intact bilaterally without bulging, retraction, inflammation or discharge. Hearing is grossly normal bilaterall  Assessments/Plans  See after visit summary for details of patient instuctions  Sore throat The most likely diagnosis is viral syndrome   I have considered and concluded the patient has a very low likelihood of having: Throat/Neck Cancer, GERD, Peritonsillar abscess, Candidiasis, Gonorrhea,  Epiglottitis, Herpes Simplex,  Thyroiditis.   Her HIV is well controlled and she does not seem to have any focal or systemic bacterial infection Will treat symptomatically

## 2018-05-11 NOTE — Telephone Encounter (Signed)
Pt was seen in clinic today, requesting a note writing her out of work. She is scheduled to work tonight-Friday night and feels she needs to take a few days off. Pts' call back [phone] Wallace Cullens, RN

## 2018-05-12 ENCOUNTER — Encounter: Payer: Self-pay | Admitting: Internal Medicine

## 2018-05-12 ENCOUNTER — Encounter: Payer: Self-pay | Admitting: *Deleted

## 2018-05-12 ENCOUNTER — Encounter: Payer: Self-pay | Admitting: Family Medicine

## 2018-05-12 NOTE — Telephone Encounter (Signed)
Pt informed and got her letter through my chart. Victora Irby Kennon Holter, CMA

## 2018-05-12 NOTE — Telephone Encounter (Signed)
Patient may return to work on Friday.  I have written a letter.  Please print and give to her  Thanks  [place]don Place

## 2018-05-15 ENCOUNTER — Encounter (HOSPITAL_COMMUNITY): Payer: Self-pay | Admitting: Emergency Medicine

## 2018-05-15 ENCOUNTER — Emergency Department (HOSPITAL_COMMUNITY)
Admission: EM | Admit: 2018-05-15 | Discharge: 2018-05-15 | Disposition: A | Discharge status: Home / Self Care | Payer: Medicaid Other | Attending: Emergency Medicine | Admitting: Emergency Medicine

## 2018-05-15 DIAGNOSIS — J029 Acute pharyngitis, unspecified: Secondary | ICD-10-CM | POA: Diagnosis not present

## 2018-05-15 DIAGNOSIS — B20 Human immunodeficiency virus [HIV] disease: Secondary | ICD-10-CM | POA: Insufficient documentation

## 2018-05-15 DIAGNOSIS — J0110 Acute frontal sinusitis, unspecified: Secondary | ICD-10-CM

## 2018-05-15 DIAGNOSIS — Z79899 Other long term (current) drug therapy: Secondary | ICD-10-CM | POA: Insufficient documentation

## 2018-05-15 MED ORDER — AMOXICILLIN-POT CLAVULANATE 875-125 MG PO TABS: 1.0000 | ORAL_TABLET | Freq: Two times a day (BID) | ORAL | 0 refills | Status: DC

## 2018-05-15 NOTE — ED Triage Notes (Signed)
Reports having sore throat and headache for two weeks.  Was seen here last week for the same.  Saw PCP this week and was given a numbing spray for the throat.  Reports pain is mild in head at this time.  Took excedrin a few hours ago.  Also c/o bil ear pain.  Left worse than right.

## 2018-05-15 NOTE — ED Provider Notes (Signed)
[facility]ERGENC[facility] DEPARTMENT Provider Note   CSN: [id number] Arrival date & time: 05/15/18  0005     History   Chief Complaint Chief Complaint  Patient presents with  . Sore Throat    HPI Kelsey Rodriguez is a 27 y.o. female with a past medical history of HIV, who presents today for evaluation of sore throat and headache.  She was seen here 6 days ago for the same symptoms which, at the time, had been present for 1 day.  She has been taking excedrin for the headache with significant temporary relief.  Her headache is over her bilateral forehead with pressure over cheek bones.  No blurry vision, double vision, confusion, N/V/D.  No abdominal pain.  Denies possibility of pregnancy. She has been trying rx lidocaine for her throat with out significant relief.  Her pain is now radiating to her bilateral ears.     HPI  Past Medical History:  Diagnosis Date  . 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 Adrenal insufficiency ([place])   . Anemia of chronic disease 03/11/2012  . Back pain of lumbar region with sciatica 02/12/2015  . Bell's palsy 08/26/2013  . 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 Chronic back pain   . Chronic leg pain    bilateral knees, ankles  . Fatigue   . 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 HIV (human immunodeficiency virus infection) ([place]) 02/2012  . Laceration of ankle, right 11/18/2012  . Lumbar radiculopathy   . Pelvic pain   . Reflux esophagitis 03/11/2012  . Tuberculosis   . 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 Vertigo     Patient Active Problem List   Diagnosis Date Noted  . Sore throat 05/11/2018  . Encounter for fertility planning 03/30/2018  . Syphilis 02/26/2018  . PID (acute pelvic inflammatory disease) 02/26/2018  . Meningitis 02/18/2018  . Tuberculosis   . Infertility, female 02/12/2018  . Lumbar back pain 10/21/2017  . 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 Presence of right artificial hip joint  08/24/2017  . Right hip pain 08/24/2017  . Acute right-sided low back pain with right-sided sciatica 08/24/2017  . Complex regional pain syndrome 02/03/2017  . Irregular menstrual cycle 02/03/2017  . Headache 10/28/2016  . 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 Avascular necrosis of bone of right hip ([place]) 04/04/2016  . 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 Avascular necrosis of bone of left hip ([place]) 12/14/2015  . 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 Overweight (BMI 25.0-29.9) 04/20/2015  . Seasonal allergies 03/20/2015  . Menorrhagia 03/19/2015  . Primary adrenal insufficiency ([place]) 01/03/2015  . Laceration of ankle, right 11/18/2012  . 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 Arthralgia 05/20/2012  . HIV (human immunodeficiency virus infection) ([place]) 03/16/2012  . 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 Iron deficiency anemia 03/11/2012  . Reflux esophagitis 03/11/2012    Past Surgical History:  Procedure Laterality Date  . APPENDECTOMY  ~ 2000  . DILATION AND CURETTAGE OF UTERUS  2008  . ESOPHAGOGASTRODUODENOSCOPY  03/11/2012   Procedure: ESOPHAGOGASTRODUODENOSCOPY (EGD);  Surgeon: Lafayette Dragon, MD;  Location: [facility] ENDOSCOPY;  Service: Endoscopy;  Laterality: N/A;  . ESOPHAGOGASTRODUODENOSCOPY N/A 03/07/2014   Procedure: ESOPHAGOGASTRODUODENOSCOPY (EGD);  Surgeon: Gatha Mayer, MD;  Location[facility] ENDOSCOPY;  Service: Endoscopy;  Laterality: N/A;  . LUNG BIOPSY  02/2012  . TOTAL HIP ARTHROPLASTY Left 12/14/2015   Procedure: LEFT TOTAL HIP ARTHROPLASTY ANTERIOR APPROACH;  Surgeon: Mcarthur Rossetti, MD;  Location: [facility] ORS;  Service: Orthopedics;  Laterality: Left;  . TOTAL HIP ARTHROPLASTY Right 04/04/2016   Procedure: RIGHT TOTAL HIP ARTHROPLASTY ANTERIOR APPROACH;  Surgeon: Mcarthur Rossetti, MD;  Location: [facility] ORS;  Service: Orthopedics;  Laterality: Right;     OB History    Gravida  1   Para  0   Term  0   Preterm  0   AB  1    Living  0     SAB  1   TAB  0   Ectopic  0   Multiple  0   Live Births               Home Medications    Prior to Admission medications   Medication Sig Start Date End Date Taking? Authorizing Provider  acetaminophen (TYLENOL) 325 MG tablet Take 650 mg by mouth every 6 (six) hours as needed for headache.    [provider]  amitriptyline (ELAVIL) 25 MG tablet 1/2 pill each bedtime x 1 week, then 1 pill nightly x 1 week, then 1 1/2 pills nightly x 1 week, then 2 pills nightly thereafter. 01/07/17   Star Age, MD  amoxicillin-clavulanate (AUGMENTIN) 875-125 MG tablet Take 1 tablet by mouth every 12 (twelve) hours for 14 days. 05/15/18 05/29/18  Lorin Glass, PA-C  benzonatate (TESSALON) 100 MG capsule Take 1 capsule (100 mg total) by mouth every 8 (eight) hours. 05/09/18   Khatri, Hina, PA-C  butalbital-acetaminophen-caffeine (FIORICET, ESGIC) 50-325-40 MG tablet Take 1-2 tablets by mouth every 6 (six) hours as needed for headache. Patient not taking: Reported on 04/14/2018 02/20/18 02/20/19  Charlesetta Shanks, MD  clotrimazole (LOTRIMIN) 1 % cream Apply 1 application topically 2 (two) times daily. 04/14/18   Carlyle Basques, MD  cyclobenzaprine (FLEXERIL) 10 MG tablet Take 1 tablet (10 mg total) by mouth 3 (three) times daily as needed for muscle spasms. 02/26/18   Everrett Coombe, MD  Darunavir-Cobicisctat-Emtricitabine-Tenofovir Alafenamide [facility]) 800-150-200-10 MG TABS Take 1 tablet by mouth daily with breakfast. 11/17/17   Carlyle Basques, MD  guaiFENesin-dextromethorphan [facility] DM) 100-10 MG/5ML syrup Take 5 mLs by mouth every 4 (four) hours as needed for cough. Patient not taking: Reported on 04/14/2018 02/19/18   Sherene Sires, DO  lidocaine (XYLOCAINE) 2 % solution Use as directed 15 mLs in the mouth or throat as needed for mouth pain. 05/09/18   Khatri, Hina, PA-C  naproxen (NAPROSYN) 500 MG tablet Take 1 tablet (500 mg total) by mouth daily as needed for mild pain or  moderate pain. 05/11/18   Lind Covert, MD  polyethylene glycol powder (GLYCOLAX/MIRALAX) powder Take 17 g by mouth 2 (two) times daily as needed. 02/03/17   Everrett Coombe, MD  sulfacetamide (BLEPH-10) 10 % ophthalmic solution Place 1-2 drops into both eyes every 4 (four) hours. 05/09/18   Delia Heady, PA-C    Family History Family History  Problem Relation Age of Onset  . Heart disease Father        Vague not clearly cardiac    Social History Social History   Tobacco Use  . Smoking status: Never Smoker  . Smokeless tobacco: Never Used  Substance Use Topics  . Alcohol use: No    Alcohol/week: 0.0 oz    Comment: socially  . Drug use: No     Allergies   Hydrocodone and Tramadol   Review of Systems Review of Systems  Constitutional: Negative for chills and fever.  HENT:  Positive for congestion, ear pain, postnasal drip, rhinorrhea, sinus pressure, sinus pain and sore throat. Negative for drooling, mouth sores, trouble swallowing and voice change.   Eyes: Negative for pain and visual disturbance.  Respiratory: Negative for cough, chest tightness and shortness of breath.   Cardiovascular: Negative for chest pain.  Gastrointestinal: Negative for abdominal pain, nausea and vomiting.  Genitourinary: Negative for dysuria and hematuria.  Musculoskeletal: Negative for arthralgias, back pain, neck pain and neck stiffness.  Skin: Negative for color change and rash.  Neurological: Positive for headaches. Negative for dizziness, seizures, syncope and light-headedness.  All other systems reviewed and are negative.    Physical Exam Updated Vital Signs BP 105/82 (BP Location: Right Arm)   Pulse 86   Temp 98.8 F (37.1 C) (Oral)   Resp 20   Ht 5\' 5"  (1.651 m)   Wt 77.6 kg (171 lb)   SpO2 100%   BMI 28.46 kg/m   Physical Exam  Constitutional: She is oriented to person, place, and time. She appears well-developed and well-nourished.  Non-toxic appearance. No distress.    HENT:  Head: Normocephalic and atraumatic.  Right Ear: Hearing, tympanic membrane, external ear and ear canal normal.  Left Ear: Hearing, tympanic membrane, external ear and ear canal normal.  Nose: Mucosal edema and rhinorrhea present. No septal deviation or nasal septal hematoma. Right sinus exhibits frontal sinus tenderness. Left sinus exhibits frontal sinus tenderness.  Mouth/Throat: Uvula is midline, oropharynx is clear and moist and mucous membranes are normal. No uvula swelling. No oropharyngeal exudate, posterior oropharyngeal edema, posterior oropharyngeal erythema or tonsillar abscesses. Tonsils are 1+ on the right. Tonsils are 1+ on the left. No tonsillar exudate.  Bilateral serous otitis media.  No trismus.    Eyes: Conjunctivae are normal. No scleral icterus.  Neck: Normal range of motion. Neck supple.  No rigidity.   Cardiovascular: Normal rate and regular rhythm.  Pulmonary/Chest: Effort normal and breath sounds normal. No stridor. No respiratory distress. She has no wheezes. She has no rhonchi. She has no rales.  Abdominal: Soft. She exhibits no distension. There is no tenderness.  Lymphadenopathy:    She has no cervical adenopathy.  Neurological: She is alert and oriented to person, place, and time.  Skin: Skin is warm and dry. She is not diaphoretic.  Psychiatric: She has a normal mood and affect. Her behavior is normal.  Nursing note and vitals reviewed.    ED Treatments / Results  Labs (all labs ordered are listed, but only abnormal results are displayed) Labs Reviewed - No data to display  EKG None  Radiology No results found.  Procedures Procedures (including critical care time)  Medications Ordered in ED Medications - No data to display   Initial Impression / Assessment and Plan / ED Course  I have reviewed the triage vital signs and the nursing notes.  Pertinent labs & imaging results that were available during my care of the patient were reviewed  by me and considered in my medical decision making (see chart for details).    Patient complaining of symptoms of sinusitis.    Patient has  purulent nasal discharge and maxillary sinus pain.  Concern for acute bacterial rhinosinusitis vs seasonal allergies.  Based on patient history of HIV  Patient discharged with Augmentin, in addition to instructions on OTC allergy medications.  Patient has no neck stiffness, is afebrile, not consistent with meningitis.  Instructions given for warm saline nasal wash and recommendations for follow-up with primary care physician.  Return precautions discussed.     Final Clinical Impressions(s) / ED Diagnoses   Final diagnoses:  Sore throat  Acute non-recurrent frontal sinusitis    ED Discharge Orders        Ordered    amoxicillin-clavulanate (AUGMENTIN) 875-125 MG tablet  Every 12 hours     05/15/18 0628       Lorin Glass, PA-C 05/17/18 Shelda Jakes, MD 05/17/18 2130

## 2018-05-15 NOTE — Discharge Instructions (Addendum)
You may have diarrhea from the antibiotics.  It is very important that you continue to take the antibiotics even if you get diarrhea unless a medical professional tells you that you may stop taking them.  If you stop too early the bacteria you are being treated for will become stronger and you may need different, more powerful antibiotics that have more side effects and worsening diarrhea.  Please stay well hydrated and consider probiotics as they may decrease the severity of your diarrhea.  Please be aware that if you take any hormonal contraception (birth control pills, nexplanon, the ring, etc) that your birth control will not work while you are taking antibiotics and you need to use back up protection as directed on the birth control medication information insert.   Please take Ibuprofen (Advil, motrin) and Tylenol (acetaminophen) to relieve your pain.  You may take up to 600 MG (3 pills) of normal strength ibuprofen every 8 hours as needed.  In between doses of ibuprofen you make take tylenol, up to 1,000 mg (two extra strength pills).  Do not take more than 3,000 mg tylenol in a 24 hour period.  Please check all medication labels as many medications such as pain and cold medications may contain tylenol.  Do not drink alcohol while taking these medications.  Do not take other NSAID'S while taking ibuprofen (such as aleve or naproxen).  Please take ibuprofen with food to decrease stomach upset.  Please consider taking a daily allergy medication to help with your symptoms.  I suggest a less drowsy 24 hour medication such as allegra, zyrtec or Claritin or the generic version.  In addition please start using nasacort or flonase.

## 2018-05-15 NOTE — ED Notes (Signed)
Pt in room  Pt alert, no family present currently, VS documented

## 2018-05-25 ENCOUNTER — Ambulatory Visit (INDEPENDENT_AMBULATORY_CARE_PROVIDER_SITE_OTHER): Payer: Medicaid Other

## 2018-05-25 ENCOUNTER — Ambulatory Visit (HOSPITAL_COMMUNITY)
Admission: EM | Admit: 2018-05-25 | Discharge: 2018-05-25 | Disposition: A | Discharge status: Home / Self Care | Payer: Medicaid Other | Attending: Family Medicine | Admitting: Family Medicine

## 2018-05-25 ENCOUNTER — Other Ambulatory Visit: Payer: Self-pay

## 2018-05-25 ENCOUNTER — Encounter (HOSPITAL_COMMUNITY): Payer: Self-pay | Admitting: Emergency Medicine

## 2018-05-25 DIAGNOSIS — M7918 Myalgia, other site: Secondary | ICD-10-CM

## 2018-05-25 MED ORDER — IBUPROFEN 800 MG PO TABS
800.0000 mg | ORAL_TABLET | Freq: Once | ORAL | Status: AC
  Administered 2018-05-25: 800 mg via ORAL

## 2018-05-25 MED ORDER — IBUPROFEN 800 MG PO TABS
ORAL_TABLET | ORAL | Status: AC
  Filled 2018-05-25: qty 1

## 2018-05-25 MED ORDER — IBUPROFEN 800 MG PO TABS: 800.0000 mg | ORAL_TABLET | Freq: Three times a day (TID) | ORAL | 0 refills | Status: DC

## 2018-05-25 MED ORDER — CYCLOBENZAPRINE HCL 5 MG PO TABS
5.0000 mg | ORAL_TABLET | Freq: Every day | ORAL | 0 refills | Status: DC
Start: 2018-05-25 — End: 2018-07-08
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
## 2018-05-25 NOTE — Discharge Instructions (Signed)
Light and regular activity as tolerated.  Ibuprofen three times a day as needed take with food and drink plenty of water.  May use muscle relaxer as needed, take a night and not if driving or drinking alcohol as may cause drowsiness.
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
## 2018-06-14 ENCOUNTER — Ambulatory Visit (INDEPENDENT_AMBULATORY_CARE_PROVIDER_SITE_OTHER): Payer: Medicaid Other | Admitting: Family Medicine

## 2018-06-14 ENCOUNTER — Other Ambulatory Visit: Payer: Self-pay

## 2018-06-14 VITALS — BP 112/68 | HR 79 | Temp 98.5°F | Ht 65.0 in | Wt 175.0 lb

## 2018-06-14 DIAGNOSIS — R1030 Lower abdominal pain, unspecified: Secondary | ICD-10-CM

## 2018-06-14 MED ORDER — POLYETHYLENE GLYCOL 3350 17 GM/SCOOP PO POWD: 17.0000 g | Freq: Two times a day (BID) | ORAL | 1 refills | Status: DC | PRN

## 2018-06-14 MED ORDER — SENNOSIDES-DOCUSATE SODIUM 8.6-50 MG PO TABS: 1.0000 | ORAL_TABLET | Freq: Two times a day (BID) | ORAL | 1 refills | Status: DC

## 2018-06-14 NOTE — Progress Notes (Signed)
Subjective: Chief Complaint  Patient presents with  . Pelvic Inflammatory Disease     HPI: Kelsey Rodriguez is a 27 y.o. presenting to clinic today to discuss the following:  Stomach Pain, Chronic Patient presents for chronic generalized abdominal pain that has been intermitten for the past 2-3 years with feelings of bloating and "fullness". She describes the pain as squeezing, cramping, more in the evenings that has just recently started to get worse for the past month. When the pain gets intense she does have periods of nausea but endorses no vomiting or diarrhea. She does endorse constipation. She has one stool per day (if at all) and states it is typically a hard stool. Her pain is not associated with eating any particular type of food but can be made worse with spicy foods. She denies any blood in her stool or on the tissue paper. It does not hurt when she uses the bathroom but can have difficulty passing stools.  Of note, patient states she has history of PID for which she went to her OBGYN, received antibiotic treatment and got better. She describes that pain as sharp and stabbing in her lower abdomen. She states the pain she is having now predates her PID and does not feel the same. She denies fever, chills, any recent new sexual partners, and last intercourse was with her husband in February of 2019.  Health Maintenance: none     ROS noted in HPI.   Past Medical, Surgical, Social, and Family History Reviewed & Updated per EMR.   Pertinent Historical Findings include:   Social History   Tobacco Use  Smoking Status Never Smoker  Smokeless Tobacco Never Used      Objective: BP 112/68   Pulse 79   Temp 98.5 F (36.9 C) (Oral)   Ht 5\' 5"  (1.651 m)   Wt 175 lb (79.4 kg)   SpO2 99%   BMI 29.12 kg/m  Vitals and nursing notes reviewed  Physical Exam Gen: Alert and Oriented x 3, NAD HEENT: Normocephalic, atraumatic, PERRLA, EOMI, non-swollen, non-erythematous  turbinates, non-erythematous pharyngeal mucosa, no exudates Neck: trachea midline, no thyroidmegaly, no LAD CV: RRR, no murmurs, normal S1, S2 split Resp: CTAB, no wheezing, rales, or rhonchi, comfortable work of breathing Abd: non-distended, mild generalized tenderness diffusely in all quadrants, soft, +bs in all four quadrants, no hepatosplenomegaly Ext: no clubbing, cyanosis, or edema Skin: warm, dry, intact, no rashes  No results found for this or any previous visit (from the past 72 hour(s)).  Assessment/Plan:  Lower abdominal pain Differential includes constipation, IBD, IBS, and celiac disease. IBD unlikely as patient is not having bloody bowel movements and not losing weight with no fever or fatigue. This would also be a atypical presentation time for IBD. Patient may have IBS with predominately constipation symptoms but no reported episodes of diarrhea.  Celiac also considered but also unlikely as no blood in stool.  PID considered but very unlikely as patient has no new sexual history since treatment and pain is of a different quality. Also, she was experiencing this type of pain.  Constipation most likely. I will get KUB to see extent. Starting on Senna-docusate and miralax daily. If not improvement in 2 weeks and she is having regular, smooth, bowel movements consider further GI workup.   PATIENT EDUCATION PROVIDED: See AVS    Diagnosis and plan along with any newly prescribed medication(s) were discussed in detail with this patient today. The patient verbalized understanding and  agreed with the plan. Patient advised if symptoms worsen return to clinic or ER.   Health Maintainance:   Orders Placed This Encounter  Procedures  . DG Abd 1 View    Standing Status:   Future    Standing Expiration Date:   12/15/2018    Order Specific Question:   Reason for Exam (SYMPTOM  OR DIAGNOSIS REQUIRED)    Answer:   abdominal pain    Order Specific Question:   Is patient pregnant?     Answer:   No    Order Specific Question:   Preferred imaging location?    Answer:   [facility]    Order Specific Question:   Radiology Contrast Protocol - do NOT remove file path    Answer:   [email or web address]    Meds ordered this encounter  Medications  . senna-docusate (SENOKOT-S) 8.6-50 MG tablet    Sig: Take 1 tablet by mouth 2 (two) times daily.    Dispense:  60 tablet    Refill:  1  . polyethylene glycol powder (GLYCOLAX/MIRALAX) powder    Sig: Take 17 g by mouth 2 (two) times daily as needed.    Dispense:  3350 g    Refill:  [place], DO 06/14/2018, 11:00 AM PGY-2, [place]

## 2018-06-14 NOTE — Patient Instructions (Signed)
It was great to meet you today! Thank you for letting me participate in your care!  Today, we discussed your chronic abdominal pain. It could be due to constipation. I have sent a prescription for senna-docusate and miralax to your pharmacy. Please pick these up and take them as prescribed.  I will call you with the results of your KUB. Please return in two weeks if your symptoms do not improve.  Please return sooner if your symptoms get worse, you develop a fever, chills, and/or vomiting.  Be well, Harolyn Rutherford, DO PGY-1, [facility]

## 2018-06-15 ENCOUNTER — Ambulatory Visit (HOSPITAL_COMMUNITY)
Admission: RE | Admit: 2018-06-15 | Discharge: 2018-06-15 | Disposition: A | Discharge status: Home / Self Care | Payer: Medicaid Other | Source: Ambulatory Visit | Attending: Family Medicine | Admitting: Family Medicine

## 2018-06-15 DIAGNOSIS — K5641 Fecal impaction: Secondary | ICD-10-CM | POA: Insufficient documentation

## 2018-06-15 DIAGNOSIS — R1030 Lower abdominal pain, unspecified: Secondary | ICD-10-CM | POA: Insufficient documentation

## 2018-06-16 ENCOUNTER — Encounter: Payer: Self-pay | Admitting: Family Medicine

## 2018-06-16 NOTE — Progress Notes (Signed)
Called patient and informed her that her abdominal x-ray did show moderate constipation that was not new. Discussed our treatment plan (senna-docusate BID + Miralax daily) and follow up in 2 weeks. Patient expressed agreement and understanding.

## 2018-06-21 DIAGNOSIS — R1030 Lower abdominal pain, unspecified: Secondary | ICD-10-CM

## 2018-06-21 HISTORY — DX: Lower abdominal pain, unspecified: R10.30

## 2018-06-21 NOTE — Assessment & Plan Note (Signed)
Differential includes constipation, IBD, IBS, and celiac disease. IBD unlikely as patient is not having bloody bowel movements and not losing weight with no fever or fatigue. This would also be a atypical presentation time for IBD. Patient may have IBS with predominately constipation symptoms but no reported episodes of diarrhea.  Celiac also considered but also unlikely as no blood in stool.  PID considered but very unlikely as patient has no new sexual history since treatment and pain is of a different quality. Also, she was experiencing this type of pain.  Constipation most likely. I will get KUB to see extent. Starting on Senna-docusate and miralax daily. If not improvement in 2 weeks and she is having regular, smooth, bowel movements consider further GI workup.

## 2018-07-06 ENCOUNTER — Telehealth: Payer: Self-pay | Admitting: Pharmacist

## 2018-07-06 NOTE — Telephone Encounter (Signed)
Called patient to see how she was doing on Symtuza.  She states she missed 2 doses last month but this month she states she is doing better and didn't miss any. No side effects or concerns at this time.  I reminded her of her appointment with Dr. Baxter Flattery in September.

## 2018-07-08 ENCOUNTER — Encounter: Payer: Self-pay | Admitting: Obstetrics & Gynecology

## 2018-07-08 ENCOUNTER — Other Ambulatory Visit: Payer: Self-pay

## 2018-07-08 ENCOUNTER — Ambulatory Visit (INDEPENDENT_AMBULATORY_CARE_PROVIDER_SITE_OTHER): Payer: Medicaid Other | Admitting: Family Medicine

## 2018-07-08 VITALS — BP 122/72 | HR 83 | Temp 98.8°F | Ht 65.0 in | Wt 176.4 lb

## 2018-07-08 DIAGNOSIS — M5432 Sciatica, left side: Secondary | ICD-10-CM | POA: Diagnosis present

## 2018-07-08 MED ORDER — HYDROCHLOROTHIAZIDE 25 MG PO TABS: 25.0000 mg | ORAL_TABLET | Freq: Every day | ORAL | 3 refills | Status: DC

## 2018-07-08 MED ORDER — POLYETHYLENE GLYCOL 3350 17 GM/SCOOP PO POWD: 17.0000 g | Freq: Two times a day (BID) | ORAL | 1 refills | Status: DC | PRN

## 2018-07-08 NOTE — Progress Notes (Signed)
     Subjective: Chief Complaint  Patient presents with  . left leg pain    HPI: Kelsey Rodriguez is a 27 y.o. presenting to clinic today to discuss the following:  Left Foot Pain Pain has been going on for 2 weeks and is mild but getting worse. She is experiencing tingling and burning that is radiating from her left buttock down to her left foot. Standing and walking makes it worse and it tends to get worse at night time and in the early morning. Pain pills not helping. Bending forward also tends to exacerbate the pain.   Health Maintenance: None     ROS noted in HPI.   Past Medical, Surgical, Social, and Family History Reviewed & Updated per EMR.   Pertinent Historical Findings include:   Social History   Tobacco Use  Smoking Status Never Smoker  Smokeless Tobacco Never Used    Objective: BP 122/72   Pulse 83   Temp 98.8 F (37.1 C) (Oral)   Ht 5\' 5"  (1.651 m)   Wt 176 lb 6.4 oz (80 kg)   SpO2 99%   BMI 29.35 kg/m  Vitals and nursing notes reviewed  Physical Exam Gen: A&O x 3, normal appearance Cardio: RRR, no murmurs, normal S1, S2 Resp: CTAB, no wheezing, no crackles MSK: Moves all extremities, positive straight leg raise on the left, spine flexion decreased, normal range of motion in LE bilaterally, gross sensation intact, 5/5 strength in BLE.  No results found for this or any previous visit (from the past 72 hour(s)).  Assessment/Plan:  Sciatica of left side Ordered Gabapentin for left sided sciatica. If no improvement consider imaging.   PATIENT EDUCATION PROVIDED: See AVS    Diagnosis and plan along with any newly prescribed medication(s) were discussed in detail with this patient today. The patient verbalized understanding and agreed with the plan. Patient advised if symptoms worsen return to clinic or ER.   Health Maintainance: None   Orders Placed This Encounter  Procedures  . Compression stockings    Meds ordered this encounter    Medications  . polyethylene glycol powder (GLYCOLAX/MIRALAX) powder    Sig: Take 17 g by mouth 2 (two) times daily as needed.    Dispense:  3350 g    Refill:  1  . DISCONTD: hydrochlorothiazide (HYDRODIURIL) 25 MG tablet    Sig: Take 1 tablet (25 mg total) by mouth daily.    Dispense:  90 tablet    Refill:  3  . gabapentin (NEURONTIN) 100 MG capsule    Sig: Take 1 capsule (100 mg total) by mouth 3 (three) times daily.    Dispense:  90 capsule    Refill:  [place], DO 07/08/2018, 9:32 AM PGY-2 [place]

## 2018-07-08 NOTE — Patient Instructions (Addendum)
It was great to see you today! Thank you for letting me participate in your care!  Today, we discussed your left leg pain. It is most likely caused by sciatica, which is a pinched nerve in your back that runs down your leg. I have given you a handout of exercises I would like you to do an instructed. You can also take over the counter Ibuprofen 400mg  to 800mg  every 8 hours as needed to help the pain.  I have also refilled your Miralax for your constipation. Please make sure to take it every day.  Please call back [facility] and have them see you and evaluate you for your continued lower abdominal pain.   Sciatica Sciatica is pain, numbness, weakness, or tingling along your sciatic nerve. The sciatic nerve starts in the lower back and goes down the back of each leg. Sciatica happens when this nerve is pinched or has pressure put on it. Sciatica usually goes away on its own or with treatment. Sometimes, sciatica may keep coming back (recur). Follow these instructions at home: Medicines  Take over-the-counter and prescription medicines only as told by your doctor.  Do not drive or use heavy machinery while taking prescription pain medicine. Managing pain  If directed, put ice on the affected area. ? Put ice in a plastic bag. ? Place a towel between your skin and the bag. ? Leave the ice on for 20 minutes, 2-3 times a day.  After icing, apply heat to the affected area before you exercise or as often as told by your doctor. Use the heat source that your doctor tells you to use, such as a moist heat pack or a heating pad. ? Place a towel between your skin and the heat source. ? Leave the heat on for 20-30 minutes. ? Remove the heat if your skin turns bright red. This is especially important if you are unable to feel pain, heat, or cold. You may have a greater risk of getting burned. Activity  Return to your normal activities as told by your doctor. Ask your doctor what activities are  safe for you. ? Avoid activities that make your sciatica worse.  Take short rests during the day. Rest in a lying or standing position. This is usually better than sitting to rest. ? When you rest for a long time, do some physical activity or stretching between periods of rest. ? Avoid sitting for a long time without moving. Get up and move around at least one time each hour.  Exercise and stretch regularly, as told by your doctor.  Do not lift anything that is heavier than 10 lb (4.5 kg) while you have symptoms of sciatica. ? Avoid lifting heavy things even when you do not have symptoms. ? Avoid lifting heavy things over and over.  When you lift objects, always lift in a way that is safe for your body. To do this, you should: ? Bend your knees. ? Keep the object close to your body. ? Avoid twisting. General instructions  Use good posture. ? Avoid leaning forward when you are sitting. ? Avoid hunching over when you are standing.  Stay at a healthy weight.  Wear comfortable shoes that support your feet. Avoid wearing high heels.  Avoid sleeping on a mattress that is too soft or too hard. You might have less pain if you sleep on a mattress that is firm enough to support your back.  Keep all follow-up visits as told by your doctor. This  is important. Contact a doctor if:  You have pain that: ? Wakes you up when you are sleeping. ? Gets worse when you lie down. ? Is worse than the pain you have had in the past. ? Lasts longer than 4 weeks.  You lose weight for without trying. Get help right away if:  You cannot control when you pee (urinate) or poop (have a bowel movement).  You have weakness in any of these areas and it gets worse. ? Lower back. ? Lower belly (pelvis). ? Butt (buttocks). ? Legs.  You have redness or swelling of your back.  You have a burning feeling when you pee. This information is not intended to replace advice given to you by your health care  provider. Make sure you discuss any questions you have with your health care provider. Document Released: 09/09/2008 Document Revised: 05/08/2016 Document Reviewed: 08/10/2015 Elsevier Interactive Patient Education  2018 [place] well, Harolyn Rutherford, DO PGY-2, [facility]

## 2018-07-11 ENCOUNTER — Encounter: Payer: Self-pay | Admitting: Internal Medicine

## 2018-07-13 ENCOUNTER — Encounter: Payer: Self-pay | Admitting: Internal Medicine

## 2018-07-13 ENCOUNTER — Other Ambulatory Visit: Payer: Self-pay

## 2018-07-13 ENCOUNTER — Ambulatory Visit (INDEPENDENT_AMBULATORY_CARE_PROVIDER_SITE_OTHER): Payer: Medicaid Other | Admitting: Internal Medicine

## 2018-07-13 VITALS — BP 123/81 | HR 91 | Temp 98.8°F | Ht 65.0 in | Wt 178.0 lb

## 2018-07-13 DIAGNOSIS — M5440 Lumbago with sciatica, unspecified side: Secondary | ICD-10-CM

## 2018-07-13 DIAGNOSIS — B20 Human immunodeficiency virus [HIV] disease: Secondary | ICD-10-CM

## 2018-07-13 DIAGNOSIS — G43C0 Periodic headache syndromes in child or adult, not intractable: Secondary | ICD-10-CM

## 2018-07-13 DIAGNOSIS — A539 Syphilis, unspecified: Secondary | ICD-10-CM

## 2018-07-13 MED ORDER — SUMATRIPTAN SUCCINATE 50 MG PO TABS: 50.0000 mg | ORAL_TABLET | ORAL | 0 refills | Status: DC | PRN

## 2018-07-13 MED ORDER — GABAPENTIN 100 MG PO CAPS: 100.0000 mg | ORAL_CAPSULE | Freq: Three times a day (TID) | ORAL | 3 refills | Status: DC

## 2018-07-13 NOTE — Assessment & Plan Note (Signed)
Ordered Gabapentin for left sided sciatica. If no improvement consider imaging.
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
## 2018-07-14 ENCOUNTER — Encounter (INDEPENDENT_AMBULATORY_CARE_PROVIDER_SITE_OTHER): Payer: Self-pay

## 2018-07-15 LAB — T-HELPER CELL (CD4) - (RCID CLINIC ONLY)
CD4 % Helper T Cell: 11 % — ABNORMAL LOW (ref 33–55)
CD4 T Cell Abs: 300 /uL — ABNORMAL LOW (ref 400–2700)

## 2018-07-16 LAB — HIV-1 RNA QUANT-NO REFLEX-BLD
HIV 1 RNA Quant: 20100 copies/mL — ABNORMAL HIGH
HIV-1 RNA Quant, Log: 4.3 Log copies/mL — ABNORMAL HIGH

## 2018-08-09 ENCOUNTER — Other Ambulatory Visit (HOSPITAL_COMMUNITY)
Admission: RE | Admit: 2018-08-09 | Discharge: 2018-08-09 | Disposition: A | Discharge status: Home / Self Care | Payer: Medicaid Other | Source: Ambulatory Visit | Attending: Obstetrics & Gynecology | Admitting: Obstetrics & Gynecology

## 2018-08-09 ENCOUNTER — Ambulatory Visit: Payer: Medicaid Other | Admitting: Obstetrics & Gynecology

## 2018-08-09 ENCOUNTER — Encounter: Payer: Self-pay | Admitting: Obstetrics & Gynecology

## 2018-08-09 VITALS — BP 126/84 | HR 79 | Wt 174.8 lb

## 2018-08-09 DIAGNOSIS — R102 Pelvic and perineal pain: Secondary | ICD-10-CM

## 2018-08-09 DIAGNOSIS — Z23 Encounter for immunization: Secondary | ICD-10-CM

## 2018-08-09 DIAGNOSIS — R1032 Left lower quadrant pain: Secondary | ICD-10-CM | POA: Diagnosis not present

## 2018-08-09 DIAGNOSIS — Z01411 Encounter for gynecological examination (general) (routine) with abnormal findings: Secondary | ICD-10-CM | POA: Diagnosis not present

## 2018-08-09 DIAGNOSIS — Z0000 Encounter for general adult medical examination without abnormal findings: Secondary | ICD-10-CM | POA: Diagnosis present

## 2018-08-09 MED ORDER — IBUPROFEN 400 MG PO TABS: ORAL_TABLET | ORAL | 0 refills | Status: DC

## 2018-08-09 NOTE — Progress Notes (Signed)
   Subjective:    Patient ID: Kelsey Rodriguez, female    DOB: 12-15-1991, 27 y.o.   MRN: [id number]  HPI 27 yo married P0 here today with the issue of left lower quadrant discomfort. She takes nothing for this. She was seen by her primary care provider who diagnosed constipation. She is taking Miralax and another over the counter stool softener. She report that this is not helping with her LLQ discomfort.  She reports that placing pressure on her LLQ makes the pain worse. Doing exercises in the gym makes it worse. Nothing makes it better.   Review of Systems Her husband lives in [place]. She saw him last in February when she was visiting there. She has HIV and is treated.    Objective:   Physical Exam Breathing, conversing, and ambulating normally Well nourished, well hydrated Black female, no apparent distress Abd- benign Bimanual exam limited by stool burden as well as voluntary guarding     Assessment & Plan:  Preventative care- pap smear with cotesting LLQ pain- repeat gyn u/s Come back in a week Rec IBU 600mg  RTC q8 hours Start Gardasil today Flu vaccine at next visit

## 2018-08-10 LAB — CYTOLOGY - PAP
Chlamydia: NEGATIVE
Diagnosis: NEGATIVE
Neisseria Gonorrhea: NEGATIVE

## 2018-08-17 ENCOUNTER — Ambulatory Visit: Payer: Medicaid Other | Admitting: Internal Medicine

## 2018-08-18 ENCOUNTER — Ambulatory Visit (HOSPITAL_COMMUNITY)
Admission: RE | Admit: 2018-08-18 | Discharge: 2018-08-18 | Disposition: A | Discharge status: Home / Self Care | Payer: Medicaid Other | Source: Ambulatory Visit | Attending: Obstetrics & Gynecology | Admitting: Obstetrics & Gynecology

## 2018-08-18 DIAGNOSIS — N7011 Chronic salpingitis: Secondary | ICD-10-CM | POA: Diagnosis not present

## 2018-08-18 DIAGNOSIS — N83291 Other ovarian cyst, right side: Secondary | ICD-10-CM | POA: Diagnosis not present

## 2018-08-18 DIAGNOSIS — R102 Pelvic and perineal pain: Secondary | ICD-10-CM | POA: Insufficient documentation

## 2018-08-18 DIAGNOSIS — G8929 Other chronic pain: Secondary | ICD-10-CM | POA: Diagnosis not present

## 2018-08-23 ENCOUNTER — Encounter: Payer: Self-pay | Admitting: Obstetrics & Gynecology

## 2018-08-23 ENCOUNTER — Ambulatory Visit (INDEPENDENT_AMBULATORY_CARE_PROVIDER_SITE_OTHER): Payer: Medicaid Other

## 2018-08-23 ENCOUNTER — Ambulatory Visit (INDEPENDENT_AMBULATORY_CARE_PROVIDER_SITE_OTHER): Payer: Medicaid Other | Admitting: Physician Assistant

## 2018-08-23 ENCOUNTER — Ambulatory Visit (INDEPENDENT_AMBULATORY_CARE_PROVIDER_SITE_OTHER): Payer: Medicaid Other | Admitting: Obstetrics & Gynecology

## 2018-08-23 ENCOUNTER — Encounter (INDEPENDENT_AMBULATORY_CARE_PROVIDER_SITE_OTHER): Payer: Self-pay | Admitting: Physician Assistant

## 2018-08-23 VITALS — Ht 65.0 in | Wt 175.0 lb

## 2018-08-23 VITALS — BP 129/75 | HR 84 | Wt 175.1 lb

## 2018-08-23 DIAGNOSIS — M25552 Pain in left hip: Secondary | ICD-10-CM

## 2018-08-23 DIAGNOSIS — M545 Low back pain: Secondary | ICD-10-CM | POA: Diagnosis not present

## 2018-08-23 DIAGNOSIS — G8929 Other chronic pain: Secondary | ICD-10-CM | POA: Diagnosis not present

## 2018-08-23 DIAGNOSIS — N7011 Chronic salpingitis: Secondary | ICD-10-CM

## 2018-08-23 DIAGNOSIS — Z0000 Encounter for general adult medical examination without abnormal findings: Secondary | ICD-10-CM

## 2018-08-23 DIAGNOSIS — M25551 Pain in right hip: Secondary | ICD-10-CM

## 2018-08-23 DIAGNOSIS — Z23 Encounter for immunization: Secondary | ICD-10-CM

## 2018-08-23 NOTE — Progress Notes (Signed)
   Subjective:    Patient ID: Kelsey Rodriguez, female    DOB: May 23, 1991, 27 y.o.   MRN: [id number]  HPI 27 yo married P0 here for follow up of her LLQ pain. She had an u/s that showed bilateral salpinges and benign ovarian cysts. Her LLQ pain is improving, using Miralax daily, having 1 BM daily.   Review of Systems Her husband lives in [place] and she sees him only rarely. She has HIV.    Objective:   Physical Exam Breathing, conversing, and ambulating normally Well nourished, well hydrated Black female, no apparent distress Cervix appears nulliparous, normal     Assessment & Plan:  LLQ pain- improving Rec increase Miralax to BID Bilateral hydrosalpinges- I did cervical cultures with her pap smear last month (negative Preventative care- flu vaccine today Come back 1 year/prn sooner

## 2018-08-23 NOTE — Progress Notes (Signed)
Office Visit Note   Patient: Kelsey Rodriguez           Date of Birth: [birth year]           MRN: [id number] Visit Date: 08/23/2018              Requested by: Everrett Coombe, MD [address] [place], [place] [address] PCP: Everrett Coombe, MD   Assessment & Plan: Visit Diagnoses:  1. Chronic midline low back pain, with sciatica presence unspecified   2. Bilateral hip pain     Plan: Due to patient's prolonged low back pain despite conservative treatments and time recommend MRI of the lumbar spine to rule out HNP as source of her pain.  She will follow-up after the MRI to go over results to discuss further treatment.  Follow-Up Instructions: Return for After MRI.   Orders:  Orders Placed This Encounter  Procedures  . XR Pelvis 1-2 Views  . XR Lumbar Spine 2-3 Views   No orders of the defined types were placed in this encounter.     Procedures: No procedures performed   Clinical Data: No additional findings.   Subjective: Chief Complaint  Patient presents with  . Lower Back - Pain  . Right Hip - Pain  . Left Hip - Pain    HPI Kelsey Rodriguez is a 27 year old female well-known to Dr. Ninfa Linden service.  She is undergone bilateral hip replacements.  She comes back in today complaining of bilateral hip pain and low back pain.  Pain is been worse over the last 2 weeks.  She is tried formal physical therapy but is continued to have low back pain with pain in the goes down to her knees.  She points to the anterior aspects of both thighs.  Pain is worse with prolonged standing.  Also she has pain in the middle of her back when she bends over.  She denies any numbness tingling down either leg.  She does have some chronic constipation.  She has waking pain she notes some chills but no fevers.  Pain is 10 out of 10 at its worst. Review of Systems  Constitutional: Positive for chills. Negative for fever.  Respiratory: Negative for shortness of breath.   Cardiovascular: Negative for chest  pain.  Gastrointestinal: Positive for constipation. Negative for nausea and vomiting.  Musculoskeletal: Positive for back pain.  Neurological: Negative for numbness.     Objective: Vital Signs: Ht 5\' 5"  (1.651 m)   Wt 175 lb (79.4 kg)   LMP 07/25/2018   BMI 29.12 kg/m   Physical Exam  Constitutional: She is oriented to person, place, and time. She appears well-developed and well-nourished. No distress.  Cardiovascular: Intact distal pulses.  Pulmonary/Chest: Effort normal.  Neurological: She is alert and oriented to person, place, and time.  Skin: She is not diaphoretic.  Psychiatric: She has a normal mood and affect. Her behavior is normal.    Ortho Exam Positive straight leg raise on the right negative on the left.  Good range of motion bilateral hips without pain.  She has 5 out of 5 strength throughout the lower extremities against resistance.  She is full forward flexion extension lumbar spine with some discomfort. Specialty Comments:  No specialty comments available.  Imaging: Xr Lumbar Spine 2-3 Views  Result Date: 08/23/2018 AP and lateral views lumbar spine: No acute fracture.  Disc space well maintained.  No spondylolisthesis.  No bony abnormalities.  Normal lordotic curvature.  Xr Pelvis 1-2 Views  Result Date: 08/23/2018 AP pelvis: Status post bilateral total hip arthroplasties.  Both hips are well located.  Total hip components are well-seated without any signs of complication.    PMFS History: Patient Active Problem List   Diagnosis Date Noted  . Lower abdominal pain 06/21/2018  . Sore throat 05/11/2018  . Encounter for fertility planning 03/30/2018  . Syphilis 02/26/2018  . PID (acute pelvic inflammatory disease) 02/26/2018  . Meningitis 02/18/2018  . Tuberculosis   . Infertility, female 02/12/2018  . Lumbar back pain 10/21/2017  . 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 Presence of right artificial hip joint 08/24/2017  . Right hip  pain 08/24/2017  . Acute right-sided low back pain with right-sided sciatica 08/24/2017  . Complex regional pain syndrome 02/03/2017  . Irregular menstrual cycle 02/03/2017  . Headache 10/28/2016  . 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 Avascular necrosis of bone of right hip ([place]) 04/04/2016  . 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 Avascular necrosis of bone of left hip ([place]) 12/14/2015  . 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 Overweight (BMI 25.0-29.9) 04/20/2015  . Seasonal allergies 03/20/2015  . Menorrhagia 03/19/2015  . Sciatica of left side 02/12/2015  . Primary adrenal insufficiency ([place]) 01/03/2015  . Laceration of ankle, right 11/18/2012  . 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 Arthralgia 05/20/2012  . HIV (human immunodeficiency virus infection) ([place]) 03/16/2012  . 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 Iron deficiency anemia 03/11/2012  . Reflux esophagitis 03/11/2012   Past Medical History:  Diagnosis Date  . 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 Adrenal insufficiency ([place])   . Anemia of chronic disease 03/11/2012  . Back pain of lumbar region with sciatica 02/12/2015  . Bell's palsy 08/26/2013  . 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 Chronic back pain   . Chronic leg pain    bilateral knees, ankles  . Fatigue   . 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 HIV (human immunodeficiency virus infection) ([place]) 02/2012  . Laceration of ankle, right 11/18/2012  . Lumbar radiculopathy   . Pelvic pain   . Reflux esophagitis 03/11/2012  . Tuberculosis   . 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 Vertigo     Family History  Problem Relation Age of Onset  . Heart disease Father        Vague not clearly cardiac    Past Surgical History:  Procedure Laterality Date  . APPENDECTOMY  ~ 2000  . DILATION AND CURETTAGE OF UTERUS  2008  . ESOPHAGOGASTRODUODENOSCOPY  03/11/2012   Procedure: ESOPHAGOGASTRODUODENOSCOPY (EGD);  Surgeon: Lafayette Dragon, MD;  Location: [facility] ENDOSCOPY;  Service: Endoscopy;  Laterality: N/A;  . ESOPHAGOGASTRODUODENOSCOPY N/A 03/07/2014   Procedure: ESOPHAGOGASTRODUODENOSCOPY (EGD);  Surgeon: Gatha Mayer, MD;  Location: [facility] ENDOSCOPY;  Service: Endoscopy;  Laterality: N/A;  . LUNG BIOPSY  02/2012  . TOTAL HIP ARTHROPLASTY Left 12/14/2015   Procedure: LEFT TOTAL HIP ARTHROPLASTY ANTERIOR APPROACH;  Surgeon: Mcarthur Rossetti, MD;  Location: [facility] ORS;  Service: Orthopedics;  Laterality: Left;  . TOTAL HIP ARTHROPLASTY Right 04/04/2016   Procedure: RIGHT TOTAL HIP ARTHROPLASTY ANTERIOR APPROACH;  Surgeon: Mcarthur Rossetti, MD;  Location: [facility] ORS;  Service: Orthopedics;  Laterality: Right;   Social History   Occupational History  . Not on file  Tobacco Use  . Smoking status: Never Smoker  . Smokeless tobacco: Never Used  Substance and Sexual Activity  . Alcohol use: No  Alcohol/week: 0.0 standard drinks    Comment: socially  . Drug use: No  . Sexual activity: Not Currently    Partners: Male    Birth control/protection: None    Comment: pt. given condoms

## 2018-08-24 ENCOUNTER — Other Ambulatory Visit (INDEPENDENT_AMBULATORY_CARE_PROVIDER_SITE_OTHER): Payer: Self-pay

## 2018-08-24 DIAGNOSIS — M4807 Spinal stenosis, lumbosacral region: Secondary | ICD-10-CM

## 2018-09-02 ENCOUNTER — Emergency Department (HOSPITAL_COMMUNITY)
Admission: EM | Admit: 2018-09-02 | Discharge: 2018-09-02 | Disposition: A | Discharge status: Home / Self Care | Payer: Medicaid Other | Attending: Emergency Medicine | Admitting: Emergency Medicine

## 2018-09-02 ENCOUNTER — Other Ambulatory Visit: Payer: Self-pay

## 2018-09-02 ENCOUNTER — Encounter (HOSPITAL_COMMUNITY): Payer: Self-pay | Admitting: Emergency Medicine

## 2018-09-02 DIAGNOSIS — Z96643 Presence of artificial hip joint, bilateral: Secondary | ICD-10-CM | POA: Insufficient documentation

## 2018-09-02 DIAGNOSIS — R1013 Epigastric pain: Secondary | ICD-10-CM | POA: Diagnosis present

## 2018-09-02 DIAGNOSIS — Z79899 Other long term (current) drug therapy: Secondary | ICD-10-CM | POA: Diagnosis not present

## 2018-09-02 LAB — I-STAT BETA HCG BLOOD, ED (MC, WL, AP ONLY): I-stat hCG, quantitative: <5 m[IU]/mL (ref <5)

## 2018-09-02 LAB — COMPREHENSIVE METABOLIC PANEL
ALT: 18 U/L (ref 0–44)
AST: 30 U/L (ref 15–41)
Albumin: 4 g/dL (ref 3.5–5.0)
Alkaline Phosphatase: 98 U/L (ref 38–126)
Anion gap: 11 (ref 5–15)
BUN: 5 mg/dL — ABNORMAL LOW (ref 6–20)
CO2: 20 mmol/L — ABNORMAL LOW (ref 22–32)
Calcium: 9.2 mg/dL (ref 8.9–10.3)
Chloride: 105 mmol/L (ref 98–111)
Creatinine, Ser: 0.85 mg/dL (ref 0.44–1.00)
GFR calc Af Amer: >60 mL/min (ref >60)
GFR calc non Af Amer: >60 mL/min (ref >60)
Glucose, Bld: 105 mg/dL — ABNORMAL HIGH (ref 70–99)
Potassium: 3.9 mmol/L (ref 3.5–5.1)
Sodium: 136 mmol/L (ref 135–145)
Total Bilirubin: 0.6 mg/dL (ref 0.3–1.2)
Total Protein: 8.6 g/dL — ABNORMAL HIGH (ref 6.5–8.1)

## 2018-09-02 LAB — CBC
HCT: 38.7 % (ref 36.0–46.0)
Hemoglobin: 12.7 g/dL (ref 12.0–15.0)
MCH: 29.5 pg (ref 26.0–34.0)
MCHC: 32.8 g/dL (ref 30.0–36.0)
MCV: 89.8 fL (ref 78.0–100.0)
Platelets: 314 10*3/uL (ref 150–400)
RBC: 4.31 MIL/uL (ref 3.87–5.11)
RDW: 16 % — ABNORMAL HIGH (ref 11.5–15.5)
WBC: 6.7 10*3/uL (ref 4.0–10.5)

## 2018-09-02 LAB — LIPASE, BLOOD: Lipase: 24 U/L (ref 11–51)

## 2018-09-02 MED ORDER — MORPHINE SULFATE (PF) 4 MG/ML IV SOLN
4.0000 mg | Freq: Once | INTRAVENOUS | Status: AC
  Administered 2018-09-02: 4 mg via INTRAVENOUS
  Filled 2018-09-02: qty 1

## 2018-09-02 MED ORDER — GI COCKTAIL ~~LOC~~
30.0000 mL | Freq: Once | ORAL | Status: AC
  Administered 2018-09-02: 30 mL via ORAL
  Filled 2018-09-02: qty 30

## 2018-09-02 MED ORDER — SODIUM CHLORIDE 0.9 % IV BOLUS
1000.0000 mL | Freq: Once | INTRAVENOUS | Status: AC
  Administered 2018-09-02: 1000 mL via INTRAVENOUS

## 2018-09-02 MED ORDER — OMEPRAZOLE 20 MG PO CPDR: 20.0000 mg | DELAYED_RELEASE_CAPSULE | Freq: Two times a day (BID) | ORAL | 0 refills | Status: DC

## 2018-09-02 MED ORDER — ONDANSETRON HCL 4 MG/2ML IJ SOLN
4.0000 mg | Freq: Once | INTRAMUSCULAR | Status: AC
  Administered 2018-09-02: 4 mg via INTRAVENOUS
  Filled 2018-09-02: qty 2

## 2018-09-02 NOTE — ED Triage Notes (Addendum)
Per EMS, pt coming from home with complaints of abdominal pain and N/V since 9pm last night. Pt stated that she throw up 3 to 4 times and that the vomit was light green colored. Pt stated that she went to [facility] last week and was told she had ovarian cysts. Pt has had abdominal pain on and off for the last year. VSS.

## 2018-09-02 NOTE — ED Notes (Signed)
D/c reviewed with patient. No further questions at this time 

## 2018-09-02 NOTE — ED Provider Notes (Signed)
[place] EMERGENCY DEPARTMENT Provider Note   CSN: [id number] Arrival date & time: 09/02/18  3329     History   Chief Complaint Chief Complaint  Patient presents with  . Emesis  . Abdominal Pain    HPI Kelsey Rodriguez is a 27 y.o. female.  The history is provided by the patient and medical records. No language interpreter was used.  Emesis   Associated symptoms include abdominal pain.  Abdominal Pain   Associated symptoms include vomiting.     27 year old female hx of HIV presenting complaining of abdominal pain.  Patient report developing upper abdominal pain that started last night around 10 PM while she was doing chores at home.  She described the pain as a "biting pain" radiates to her back, waxing waning, usually lasting for about 10 seconds with associated nausea.  States she has mild vomited multiple episodes of greenish emesis.  Laying flat seems to make the pain worse.  She endorsed some chills without fever.  She also endorsed lightheadedness and dizziness with this episode.  No complaints of chest pain, shortness of breath, productive cough, dysuria, hematuria.  Last bowel movement was today.  States she does she does have history of constipation and take MiraLAX twice daily.  She mentioned she was recently seen at [facility] for lower abdominal pain that she has had for the past month and was diagnosed with ovarian cyst.  She just finished last menstrual period approximately 2 days ago.  She denies any recent travel or eating exotic food.  Denies any alcohol abuse or history of diabetes.   Past Medical History:  Diagnosis Date  . 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 Adrenal insufficiency ([place])   . Anemia of chronic disease 03/11/2012  . Back pain of lumbar region with sciatica 02/12/2015  . Bell's palsy 08/26/2013  . 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 Chronic back pain   . Chronic leg pain    bilateral knees, ankles  . Fatigue   . 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 HIV (human immunodeficiency virus infection) ([place]) 02/2012  . Laceration of ankle, right 11/18/2012  . Lumbar radiculopathy   . Pelvic pain   . Reflux esophagitis 03/11/2012  . Tuberculosis   . 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 Vertigo     Patient Active Problem List   Diagnosis Date Noted  . Lower abdominal pain 06/21/2018  . Sore throat 05/11/2018  . Encounter for fertility planning 03/30/2018  . Syphilis 02/26/2018  . PID (acute pelvic inflammatory disease) 02/26/2018  . Meningitis 02/18/2018  . Tuberculosis   . Infertility, female 02/12/2018  . Lumbar back pain 10/21/2017  . 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 Presence of right artificial hip joint 08/24/2017  . Right hip pain 08/24/2017  . Acute right-sided low back pain with right-sided sciatica 08/24/2017  . Complex regional pain syndrome 02/03/2017  . Irregular menstrual cycle 02/03/2017  . Headache 10/28/2016  . 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 Avascular necrosis of bone of right hip ([place]) 04/04/2016  . 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 Avascular necrosis of bone of left hip ([place]) 12/14/2015  . 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 Overweight (BMI 25.0-29.9) 04/20/2015  . Seasonal allergies 03/20/2015  . Menorrhagia 03/19/2015  . Sciatica of left side 02/12/2015  . Primary adrenal insufficiency ([place]) 01/03/2015  . Laceration of ankle, right 11/18/2012  . 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  Arthralgia 05/20/2012  . HIV (human immunodeficiency virus infection) ([place]) 03/16/2012  . 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 Iron deficiency anemia 03/11/2012  . Reflux esophagitis 03/11/2012    Past Surgical History:  Procedure Laterality Date  . APPENDECTOMY  ~ 2000  . DILATION AND CURETTAGE OF UTERUS  2008  . ESOPHAGOGASTRODUODENOSCOPY  03/11/2012   Procedure: ESOPHAGOGASTRODUODENOSCOPY (EGD);  Surgeon:  Lafayette Dragon, MD;  [facility]COPY;  Service: Endoscopy;  Laterality: N/A;  . ESOPHAGOGASTRODUODENOSCOPY N/A 03/07/2014   Procedure: ESOPHAGOGASTRODUODENOSCOPY (EGD);  Surgeon: Gatha Mayer, MD;  Location: [facility] ENDOSCOPY;  Service: Endoscopy;  Laterality: N/A;  . LUNG BIOPSY  02/2012  . TOTAL HIP ARTHROPLASTY Left 12/14/2015   Procedure: LEFT TOTAL HIP ARTHROPLASTY ANTERIOR APPROACH;  Surgeon: Mcarthur Rossetti, MD;  [facility];  Service: Orthopedics;  Laterality: Left;  . TOTAL HIP ARTHROPLASTY Right 04/04/2016   Procedure: RIGHT TOTAL HIP ARTHROPLASTY ANTERIOR APPROACH;  Surgeon: Mcarthur Rossetti, MD;  Location: [facility] ORS;  Service: Orthopedics;  Laterality: Right;     OB History    Gravida  1   Para  0   Term  0   Preterm  0   AB  1   Living  0     SAB  1   TAB  0   Ectopic  0   Multiple  0   Live Births               Home Medications    Prior to Admission medications   Medication Sig Start Date End Date Taking? Authorizing Provider  Darunavir-Cobicisctat-Emtricitabine-Tenofovir Alafenamide (SYMTUZA) 800-150-200-10 MG TABS Take 1 tablet by mouth daily with breakfast. 11/17/17   Carlyle Basques, MD  gabapentin (NEURONTIN) 100 MG capsule Take 1 capsule (100 mg total) by mouth 3 (three) times daily. 07/13/18   Nuala Alpha, DO  ibuprofen (ADVIL,MOTRIN) 400 MG tablet Take 1 pill ever 8 hours prn 08/09/18   Dove, Myra C, MD  polyethylene glycol powder (GLYCOLAX/MIRALAX) powder Take 17 g by mouth 2 (two) times daily as needed. 07/08/18   Lockamy, Christia Reading, DO  senna-docusate (SENOKOT-S) 8.6-50 MG tablet Take 1 tablet by mouth 2 (two) times daily. 06/14/18   Nuala Alpha, DO  SUMAtriptan (IMITREX) 50 MG tablet Take 1 tablet (50 mg total) by mouth every 2 (two) hours as needed for migraine. May repeat in 2 hours if headache persists or recurs. 07/13/18   Carlyle Basques, MD    Family History Family History  Problem Relation Age of Onset  . Heart  disease Father        Vague not clearly cardiac    Social History Social History   Tobacco Use  . Smoking status: Never Smoker  . Smokeless tobacco: Never Used  Substance Use Topics  . Alcohol use: No    Alcohol/week: 0.0 standard drinks    Comment: socially  . Drug use: No     Allergies   Hydrocodone and Tramadol   Review of Systems Review of Systems  Gastrointestinal: Positive for abdominal pain and vomiting.  All other systems reviewed and are negative.    Physical Exam Updated Vital Signs BP (!) 126/96   Pulse (!) 101   Temp 98.8 F (37.1 C) (Oral)   Ht 5\' 5"  (1.651 m)   Wt 78.9 kg   LMP 08/31/2018 (Exact Date)   SpO2 100%   BMI 28.96 kg/m   Physical Exam  Constitutional: She appears well-developed and well-nourished. No distress.  HENT:  Head: Atraumatic.  Eyes: Conjunctivae are normal.  Neck: Neck supple.  Cardiovascular: Normal rate and regular rhythm.  Pulmonary/Chest: Effort normal and breath sounds normal.  Abdominal: Soft. Normal appearance. There is generalized tenderness. There is no tenderness at McBurney's point and negative Murphy's sign.  Neurological: She is alert.  Skin: No rash noted.  Psychiatric: She has a normal mood and affect.  Nursing note and vitals reviewed.    ED Treatments / Results  Labs (all labs ordered are listed, but only abnormal results are displayed) Labs Reviewed  COMPREHENSIVE METABOLIC PANEL - Abnormal; Notable for the following components:      Result Value   CO2 20 (*)    Glucose, Bld 105 (*)    BUN 5 (*)    Total Protein 8.6 (*)    All other components within normal limits  CBC - Abnormal; Notable for the following components:   RDW 16.0 (*)    All other components within normal limits  LIPASE, BLOOD  URINALYSIS, ROUTINE W REFLEX MICROSCOPIC  I-STAT BETA HCG BLOOD, ED ([facility], [facility], [facility] ONLY)    EKG None  Radiology No results found.  Procedures Procedures (including critical care  time)  Medications Ordered in ED Medications  ondansetron (ZOFRAN) injection 4 mg (4 mg Intravenous Given 09/02/18 0647)  gi cocktail (Maalox,Lidocaine,Donnatal) (30 mLs Oral Given 09/02/18 0647)  morphine 4 MG/ML injection 4 mg (4 mg Intravenous Given 09/02/18 0647)  sodium chloride 0.9 % bolus 1,000 mL (1,000 mLs Intravenous New Bag/Given 09/02/18 0647)     Initial Impression / Assessment and Plan / ED Course  I have reviewed the triage vital signs and the nursing notes.  Pertinent labs & imaging results that were available during my care of the patient were reviewed by me and considered in my medical decision making (see chart for details).     BP (!) 129/102   Pulse 91   Temp 98.8 F (37.1 C) (Oral)   Ht 5\' 5"  (1.651 m)   Wt 78.9 kg   LMP 08/31/2018 (Exact Date)   SpO2 100%   BMI 28.96 kg/m    Final Clinical Impressions(s) / ED Diagnoses   Final diagnoses:  Epigastric pain    ED Discharge Orders         Ordered    omeprazole (PRILOSEC) 20 MG capsule  2 times daily before meals     09/02/18 0802         6:39 AM Patient here with episodic upper abdominal pain.  She does have some mild discomfort diffusely throughout her abdomen without focal point tenderness.  She is afebrile, vital signs stable.  She does not have any significant risk factor for pancreatitis.  Will provide symptomatic treatment, work-up initiated.  8:00 AM Pregnancy test is negative, normal lipase, electrolyte panels are reassuring, normal WBC and normal H&H.  After receiving treatment, patient reported feeling much better.  She is stable for discharge.  I suspect this is likely gastritis/GERD and less likely to be acute abdominal pathology.  Encourage patient to avoid eating spicy food, eating late, and will prescribe PPI.  Return precautions discussed.   Domenic Moras, PA-C 09/02/18 0804    Ward, Delice Bison, DO 09/09/18 2308

## 2018-09-03 ENCOUNTER — Encounter (INDEPENDENT_AMBULATORY_CARE_PROVIDER_SITE_OTHER): Payer: Self-pay | Admitting: *Deleted

## 2018-09-05 ENCOUNTER — Inpatient Hospital Stay: Admission: RE | Admit: 2018-09-05 | Payer: Medicaid Other | Source: Ambulatory Visit

## 2018-09-06 ENCOUNTER — Encounter (INDEPENDENT_AMBULATORY_CARE_PROVIDER_SITE_OTHER): Payer: Self-pay | Admitting: Orthopaedic Surgery

## 2018-09-07 ENCOUNTER — Other Ambulatory Visit (INDEPENDENT_AMBULATORY_CARE_PROVIDER_SITE_OTHER): Payer: Self-pay | Admitting: Radiology

## 2018-09-07 DIAGNOSIS — Z96641 Presence of right artificial hip joint: Secondary | ICD-10-CM

## 2018-09-12 ENCOUNTER — Ambulatory Visit
Admission: RE | Admit: 2018-09-12 | Discharge: 2018-09-12 | Disposition: A | Discharge status: Home / Self Care | Payer: Medicaid Other | Source: Ambulatory Visit | Attending: Orthopaedic Surgery | Admitting: Orthopaedic Surgery

## 2018-09-12 DIAGNOSIS — M4807 Spinal stenosis, lumbosacral region: Secondary | ICD-10-CM

## 2018-09-14 ENCOUNTER — Encounter (INDEPENDENT_AMBULATORY_CARE_PROVIDER_SITE_OTHER): Payer: Self-pay | Admitting: Orthopaedic Surgery

## 2018-09-14 ENCOUNTER — Ambulatory Visit (INDEPENDENT_AMBULATORY_CARE_PROVIDER_SITE_OTHER): Payer: Medicaid Other | Admitting: Orthopaedic Surgery

## 2018-09-14 ENCOUNTER — Other Ambulatory Visit (INDEPENDENT_AMBULATORY_CARE_PROVIDER_SITE_OTHER): Payer: Self-pay

## 2018-09-14 DIAGNOSIS — M25551 Pain in right hip: Secondary | ICD-10-CM | POA: Diagnosis not present

## 2018-09-14 DIAGNOSIS — M5441 Lumbago with sciatica, right side: Secondary | ICD-10-CM

## 2018-09-14 DIAGNOSIS — G8929 Other chronic pain: Secondary | ICD-10-CM

## 2018-09-14 NOTE — Progress Notes (Signed)
Office Visit Note   Patient: Kelsey Rodriguez           Date of Birth: 06/26/1991           MRN: [id number] Visit Date: 09/14/2018              Requested by: Everrett Coombe, MD [address] [place], [place] [address] PCP: Everrett Coombe, MD   Assessment & Plan: Visit Diagnoses: No diagnosis found.  Plan: Send her back to physical therapy to work on core strengthening hamstring strengthening exchange back exercises.  Spelt that most likely be chronic pain syndrome.  She has a previous history of complex regional pain syndrome with the pain radiating from the scar in her right chest to her right shoulder.  Feel that therapy and strengthening of her core back and stretching of her hamstrings be most beneficial.  She will follow-up with [place] on a as needed basis. Discussed with her that her white count ER was normal her radiographs of her bilateral hips appeared normal without any evidence of loosening complication or spine MRI was completely normal.  Follow-Up Instructions: Return if symptoms worsen or fail to improve.   Orders:  No orders of the defined types were placed in this encounter.  No orders of the defined types were placed in this encounter.     Procedures: No procedures performed   Clinical Data: No additional findings.   Subjective: Chief Complaint  Patient presents with  . Lower Back - Follow-up    HPI Kelsey Rodriguez returns today to go over her MRI of her lumbar spine.  She has been seen in the ER since her last visit with our office.  At that time she was having abdominal pain was felt to have gastritis was placed on a PPI.  White count in the ER was normal.  She states that the PPIs helped with her abdominal symptoms.  She continues to have pain shooting down her right hip mainly when bending over this goes to her knee.  She feels that her right leg is weak and that is going to give way.  She has tingling down the leg when lying down.  She does have some pain in  the left hip at times but not to the severity of the right.  She has pain in the right hip and getting in and out of a car. MRI lumbar spine was reviewed with patient shows no source for her pain particularly the radicular symptoms down the right leg.  MRI was read as completely normal Review of Systems Please see HPI otherwise negative  Objective: Vital Signs: LMP 08/31/2018 (Exact Date)   Physical Exam  Constitutional: She is oriented to person, place, and time. She appears well-developed and well-nourished. No distress.  Pulmonary/Chest: Effort normal.  Neurological: She is alert and oriented to person, place, and time.  Skin: She is not diaphoretic.    Ortho Exam Bilateral full range of motion of both hips without significant pain.  Nontender over the trochanteric region of both hips.  She has negative straight leg raise on the left.  Subjective radicular pain and low back pain  with straight leg raise on the right.  Tight hamstrings bilaterally. Specialty Comments:  No specialty comments available.  Imaging: No results found.   PMFS History: Patient Active Problem List   Diagnosis Date Noted  . Lower abdominal pain 06/21/2018  . Sore throat 05/11/2018  . Encounter for fertility planning 03/30/2018  . Syphilis 02/26/2018  .  PID (acute pelvic inflammatory disease) 02/26/2018  . Meningitis 02/18/2018  . Tuberculosis   . Infertility, female 02/12/2018  . Lumbar back pain 10/21/2017  . 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 Presence of right artificial hip joint 08/24/2017  . Right hip pain 08/24/2017  . Acute right-sided low back pain with right-sided sciatica 08/24/2017  . Complex regional pain syndrome 02/03/2017  . Irregular menstrual cycle 02/03/2017  . Headache 10/28/2016  . 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 Avascular necrosis of bone of right hip ([place]) 04/04/2016  . 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 Avascular  necrosis of bone of left hip ([place]) 12/14/2015  . 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 Overweight (BMI 25.0-29.9) 04/20/2015  . Seasonal allergies 03/20/2015  . Menorrhagia 03/19/2015  . Sciatica of left side 02/12/2015  . Primary adrenal insufficiency () 01/03/2015  . Laceration of ankle, right 11/18/2012  . 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 Arthralgia 05/20/2012  . HIV (human immunodeficiency virus infection) ([place]) 03/16/2012  . 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 Iron deficiency anemia 03/11/2012  . Reflux esophagitis 03/11/2012   Past Medical History:  Diagnosis Date  . 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 Adrenal insufficiency ([place])   . Anemia of chronic disease 03/11/2012  . Back pain of lumbar region with sciatica 02/12/2015  . Bell's palsy 08/26/2013  . 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 Chronic back pain   . Chronic leg pain    bilateral knees, ankles  . Fatigue   . 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 HIV (human immunodeficiency virus infection) ([place]) 02/2012  . Laceration of ankle, right 11/18/2012  . Lumbar radiculopathy   . Pelvic pain   . Reflux esophagitis 03/11/2012  . Tuberculosis   . 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 Vertigo     Family History  Problem Relation Age of Onset  . Heart disease Father        Vague not clearly cardiac    Past Surgical History:  Procedure Laterality Date  . APPENDECTOMY  ~ 2000  . DILATION AND CURETTAGE OF UTERUS  2008  . ESOPHAGOGASTRODUODENOSCOPY  03/11/2012   Procedure: ESOPHAGOGASTRODUODENOSCOPY (EGD);  Surgeon: Lafayette Dragon, MD;  Location: [facility] ENDOSCOPY;  Service: Endoscopy;  Laterality: N/A;  . ESOPHAGOGASTRODUODENOSCOPY N/A 03/07/2014   Procedure: ESOPHAGOGASTRODUODENOSCOPY (EGD);  Surgeon: Gatha Mayer, MD;  Location: [facility] ENDOSCOPY;  Service: Endoscopy;  Laterality: N/A;  . LUNG BIOPSY  02/2012  . TOTAL HIP ARTHROPLASTY Left 12/14/2015   Procedure: LEFT TOTAL HIP ARTHROPLASTY  ANTERIOR APPROACH;  Surgeon: Mcarthur Rossetti, MD;  Location: [facility] ORS;  Service: Orthopedics;  Laterality: Left;  . TOTAL HIP ARTHROPLASTY Right 04/04/2016   Procedure: RIGHT TOTAL HIP ARTHROPLASTY ANTERIOR APPROACH;  Surgeon: Mcarthur Rossetti, MD;  Location: [facility] ORS;  Service: Orthopedics;  Laterality: Right;   Social History   Occupational History  . Not on file  Tobacco Use  . Smoking status: Never Smoker  . Smokeless tobacco: Never Used  Substance and Sexual Activity  . Alcohol use: No    Alcohol/week: 0.0 standard drinks    Comment: socially  . Drug use: No  . Sexual activity: Not Currently    Partners: Male    Birth control/protection: None    Comment: pt. given condoms

## 2018-09-21 ENCOUNTER — Other Ambulatory Visit: Payer: Self-pay | Admitting: Internal Medicine

## 2018-09-26 ENCOUNTER — Ambulatory Visit
Admission: RE | Admit: 2018-09-26 | Discharge: 2018-09-26 | Disposition: A | Discharge status: Home / Self Care | Payer: Medicaid Other | Source: Ambulatory Visit | Attending: Orthopaedic Surgery | Admitting: Orthopaedic Surgery

## 2018-09-26 DIAGNOSIS — Z96641 Presence of right artificial hip joint: Secondary | ICD-10-CM

## 2018-09-28 ENCOUNTER — Ambulatory Visit: Payer: Medicaid Other | Attending: Orthopaedic Surgery | Admitting: Physical Therapy

## 2018-09-28 ENCOUNTER — Other Ambulatory Visit: Payer: Self-pay

## 2018-09-28 ENCOUNTER — Encounter: Payer: Self-pay | Admitting: Physical Therapy

## 2018-09-28 DIAGNOSIS — G8929 Other chronic pain: Secondary | ICD-10-CM | POA: Insufficient documentation

## 2018-09-28 DIAGNOSIS — M5441 Lumbago with sciatica, right side: Secondary | ICD-10-CM | POA: Diagnosis present

## 2018-09-28 NOTE — Therapy (Signed)
[place], [place], [address] Phone: [phone]   Fax:  [phone]  Physical Therapy Evaluation  Patient Details  Name: Kelsey Rodriguez MRN: [id number] Date of Birth: 11-Aug-1991 Referring Provider (PT): Jean Rosenthal, MD   Encounter Date: 09/28/2018  PT End of Session - 09/28/18 1211    Visit Number  1    Number of Visits  4    Date for PT Re-Evaluation  10/26/18    Authorization Type  Medicaid-requesting 3 visits    PT Start Time  1018    PT Stop Time  1050    PT Time Calculation (min)  32 min    Activity Tolerance  Patient tolerated treatment well    Behavior During Therapy  [facility] for tasks assessed/performed       Past Medical History:  Diagnosis Date  . 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 Adrenal insufficiency ([place])   . Anemia of chronic disease 03/11/2012  . Back pain of lumbar region with sciatica 02/12/2015  . Bell's palsy 08/26/2013  . 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 Chronic back pain   . Chronic leg pain    bilateral knees, ankles  . Fatigue   . 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 HIV (human immunodeficiency virus infection) ([place]) 02/2012  . Laceration of ankle, right 11/18/2012  . Lumbar radiculopathy   . Pelvic pain   . Reflux esophagitis 03/11/2012  . Tuberculosis   . 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 Vertigo     Past Surgical History:  Procedure Laterality Date  . APPENDECTOMY  ~ 2000  . DILATION AND CURETTAGE OF UTERUS  2008  . ESOPHAGOGASTRODUODENOSCOPY  03/11/2012   Procedure: ESOPHAGOGASTRODUODENOSCOPY (EGD);  Surgeon: Lafayette Dragon, MD;  Location: [facility] ENDOSCOPY;  Service: Endoscopy;  Laterality: N/A;  . ESOPHAGOGASTRODUODENOSCOPY N/A 03/07/2014   Procedure: ESOPHAGOGASTRODUODENOSCOPY (EGD);  Surgeon: Gatha Mayer, MD;  Location[facility] ENDOSCOPY;  Service: Endoscopy;  Laterality: N/A;  . LUNG BIOPSY  02/2012  . TOTAL HIP ARTHROPLASTY Left 12/14/2015    Procedure: LEFT TOTAL HIP ARTHROPLASTY ANTERIOR APPROACH;  Surgeon: Mcarthur Rossetti, MD;  Location: [facility] ORS;  Service: Orthopedics;  Laterality: Left;  . TOTAL HIP ARTHROPLASTY Right 04/04/2016   Procedure: RIGHT TOTAL HIP ARTHROPLASTY ANTERIOR APPROACH;  Surgeon: Mcarthur Rossetti, MD;  Location: [facility] ORS;  Service: Orthopedics;  Laterality: Right;    There were no vitals filed for this visit.   Subjective Assessment - 09/28/18 1023    Subjective  Pt. with previous history chronic lumbar pain reports new onset/exacerbation beginning about a month ago, no mechanism of injury noted. Symptoms include local lumbar pain as well as radiating pain and "tingling" down right posterolateral thigh distally to knee region. Pt. had MRI 09/12/18 which was (-) findings of neurological compression. Pt. does have history bilateral anterior THA and had right hip MRI 09/26/18 but has not yet heard results. Plan per MD referral is to work on core strengthening and hamstring flexibility with PT.    Pertinent History  Bilat. anterior THA with history AVN, previous lumbar pain with PT last year, history CRPS    Limitations  Standing;Walking;Lifting;House hold activities   bending   How long can you sit comfortably?  30-45 minutes    How long can you stand comfortably?  15-20 minutes    How long can you walk comfortably?  10-15 minutes    Diagnostic tests  MRI lumbar spine 09-12-18    Patient Stated Goals  "  No pain"    Currently in Pain?  Yes    Pain Score  5     Pain Location  Back    Pain Orientation  --   central/mid-line pain with radiating into RLE   Pain Descriptors / Indicators  Sharp    Pain Type  Chronic pain    Pain Radiating Towards  Right posterolateral thigh    Pain Onset  Other (comment)   approximately 1 month ago   Pain Frequency  Constant   Local lumbar pain is constant, right LE radiating pain is intermittent   Aggravating Factors   bending, prolonged standing    Pain Relieving  Factors  rest, laying down    Effect of Pain on Daily Activities  difficulty with chores and tolerating IADLs with prolonged standing and ambulation such as grocery shopping    Multiple Pain Sites  No         [facility] PT Assessment - 09/28/18 0001      Assessment   Medical Diagnosis  LBP with R Sciatica    Referring Provider (PT)  Jean Rosenthal, MD    Onset Date/Surgical Date  08/29/18   per pt. report of new onset/exacerbation 1 month ago   Hand Dominance  Right    Next MD Visit  none scheduled    Prior Therapy  Past PT last year      Precautions   Precautions  --   history bilat. anterior THA     Restrictions   Weight Bearing Restrictions  No      Balance Screen   Has the patient fallen in the past 6 months  No      Home Environment   Living Environment  --   1 step to enter home     Prior Function   Level of Independence  Independent      Cognition   Overall Cognitive Status  Within Functional Limits for tasks assessed      Sensation   Light Touch  --   Pt. reports mild hyposensitivity RLE with dermatomal screen     ROM / Strength   AROM / PROM / Strength  AROM;PROM;Strength      AROM   AROM Assessment Site  Lumbar;Hip    Right/Left Hip  Right;Left   hip AROM grossly WFL for mat mobility and transfers   Lumbar Flexion  60 deg   increased LBP   Lumbar Extension  20 deg    Lumbar - Right Side Bend  20 deg    Lumbar - Left Side Bend  20 deg    Lumbar - Right Rotation  80%    Lumbar - Left Rotation  70%      PROM   Overall PROM   --   Hip PROM for flexion limited at 80 deg bilat. with guarding     Strength   Strength Assessment Site  Ankle;Knee;Hip    Right/Left Hip  Right;Left    Right Hip Flexion  3+/5    Right Hip Extension  4-/5    Right Hip External Rotation   4-/5    Right Hip Internal Rotation  4/5    Right Hip ABduction  3+/5    Left Hip Flexion  4/5    Left Hip Extension  4/5    Left Hip External Rotation  4/5    Left Hip Internal  Rotation  4+/5    Left Hip ABduction  4/5    Right/Left Knee  Right;Left  Right Knee Flexion  4+/5    Right Knee Extension  4+/5    Left Knee Flexion  4+/5    Left Knee Extension  4+/5    Right/Left Ankle  Right;Left    Right Ankle Dorsiflexion  4+/5    Right Ankle Inversion  4+/5    Right Ankle Eversion  4+/5    Left Ankle Dorsiflexion  4+/5    Left Ankle Inversion  4+/5    Left Ankle Eversion  4+/5      Flexibility   Soft Tissue Assessment /Muscle Length  yes    Hamstrings  R SLR 40 deg, left SLR 50 deg   gross hamstring tightness bilat.     Special Tests    Special Tests  Lumbar    Lumbar Tests  Straight Leg Raise   (+) on right               Objective measurements completed on examination: See above findings.      [place] Adult PT Treatment/Exercise - 09/28/18 0001      Exercises   Exercises  Lumbar      Lumbar Exercises: Stretches   Other Lumbar Stretch Exercise  instructed in seated hamstring stretch      Lumbar Exercises: Supine   Pelvic Tilt  5 reps   instructed HEP   Bent Knee Raise  5 reps   instucted in HEP   Bridge  5 reps   instructed HEP            PT Education - 09/28/18 1208    Education Details  HEP, POC, pain with potential issues central sensitivity, potential for pain despite (-) lumbar MRI findings    Person(s) Educated  Patient    Methods  Explanation;Demonstration;Verbal cues    Comprehension  Verbalized understanding;Returned demonstration       PT Short Term Goals - 09/28/18 1219      PT SHORT TERM GOAL #1   Title  Independent with HEP    Baseline  no HEP    Time  4    Period  Weeks    Status  New      PT SHORT TERM GOAL #2   Title  Increase trunk flexion AROM at least 10-20 dge to improve ability for bending for chores, donning shoes    Baseline  60 deg    Time  4    Period  Weeks    Status  New      PT SHORT TERM GOAL #3   Title  Return demos proper body mechanics for lifting for chores to decrease  lumbar strain    Baseline  limited ability to bend/lift for chores    Time  4    Period  Weeks    Status  New                Plan - 09/28/18 1212    Clinical Impression Statement  Pt. presents with lumbar and right LE radiating pain with (-) lumbar MRI. Will await status with hip MRI results to rule out hip pathology as contributing to current symptoms. Exam findings include core and LE weakness and significant hamstring tightness. Pt. would benefit from PT ot address current associated functional limitations.    History and Personal Factors relevant to plan of care:  History of CRPS, bilat. THA, previous lumbar pain episodes    Clinical Presentation  Evolving    Clinical Presentation due to:  potential contribution from hip pending  further imaging results    Clinical Decision Making  Moderate    Rehab Potential  Fair    Clinical Impairments Affecting Rehab Potential  history chronic pain and     PT Frequency  1x / week    PT Duration  4 weeks    PT Treatment/Interventions  Electrical Stimulation;Moist Heat;Neuromuscular re-education;Patient/family education;Therapeutic activities;Therapeutic exercise;Dry needling;Manual techniques;Passive range of motion;Functional mobility training;Ultrasound;ADLs/Self Care Home Management    PT Next Visit Plan  Hamstring stretches, progress with mat-based core exercises as tolerated pending pain, pt. has history bilat. ant THA    PT Home Exercise Plan  pelvic tilts, hamstring stretch (seated to start due to significant tightness), hooklying march, hip bridge    Consulted and Agree with Plan of Care  Patient       Patient will benefit from skilled therapeutic intervention in order to improve the following deficits and impairments:  Decreased mobility, Decreased activity tolerance, Decreased endurance, Decreased range of motion, Difficulty walking, Pain  Visit Diagnosis: Chronic right-sided low back pain with right-sided sciatica - Plan: PT plan  of care cert/re-cert     Problem List Patient Active Problem List   Diagnosis Date Noted  . Lower abdominal pain 06/21/2018  . Sore throat 05/11/2018  . Encounter for fertility planning 03/30/2018  . Syphilis 02/26/2018  . PID (acute pelvic inflammatory disease) 02/26/2018  . Meningitis 02/18/2018  . Tuberculosis   . Infertility, female 02/12/2018  . Lumbar back pain 10/21/2017  . 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 Presence of right artificial hip joint 08/24/2017  . Right hip pain 08/24/2017  . Acute right-sided low back pain with right-sided sciatica 08/24/2017  . Complex regional pain syndrome 02/03/2017  . Irregular menstrual cycle 02/03/2017  . Headache 10/28/2016  . 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 Avascular necrosis of bone of right hip ([place]) 04/04/2016  . 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 Avascular necrosis of bone of left hip ([place]) 12/14/2015  . 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 Overweight (BMI 25.0-29.9) 04/20/2015  . Seasonal allergies 03/20/2015  . Menorrhagia 03/19/2015  . Sciatica of left side 02/12/2015  . Primary adrenal insufficiency ([place]) 01/03/2015  . Laceration of ankle, right 11/18/2012  . 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 Arthralgia 05/20/2012  . HIV (human immunodeficiency virus infection) ([place]) 03/16/2012  . 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 Iron deficiency anemia 03/11/2012  . Reflux esophagitis 03/11/2012   Beaulah Dinning, PT, DPT 09/28/18 1:14 PM  [facility] [address] [place], [place], [address] Phone: [phone]   Fax:  [phone]  Name: Kelsey Rodriguez MRN: [id number] Date of Birth: Aug 17, 1991

## 2018-09-28 NOTE — Therapy (Signed)
[place], [place], [address] Phone: [phone]   Fax:  [phone]  Physical Therapy Evaluation  Patient Details  Name: Kelsey Rodriguez MRN: [id number] Date of Birth: 09/18/1991 Referring Provider (PT): Jean Rosenthal, MD   Encounter Date: 09/28/2018  PT End of Session - 09/28/18 1211    Visit Number  1    Number of Visits  4    Date for PT Re-Evaluation  10/26/18    Authorization Type  Medicaid-requesting 3 visits    PT Start Time  1018    PT Stop Time  1050    PT Time Calculation (min)  32 min    Activity Tolerance  Patient tolerated treatment well    Behavior During Therapy  [facility] for tasks assessed/performed       Past Medical History:  Diagnosis Date  . 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 Adrenal insufficiency ([place])   . Anemia of chronic disease 03/11/2012  . Back pain of lumbar region with sciatica 02/12/2015  . Bell's palsy 08/26/2013  . 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 Chronic back pain   . Chronic leg pain    bilateral knees, ankles  . Fatigue   . 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 HIV (human immunodeficiency virus infection) ([place]) 02/2012  . Laceration of ankle, right 11/18/2012  . Lumbar radiculopathy   . Pelvic pain   . Reflux esophagitis 03/11/2012  . Tuberculosis   . 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 Vertigo     Past Surgical History:  Procedure Laterality Date  . APPENDECTOMY  ~ 2000  . DILATION AND CURETTAGE OF UTERUS  2008  . ESOPHAGOGASTRODUODENOSCOPY  03/11/2012   Procedure: ESOPHAGOGASTRODUODENOSCOPY (EGD);  Surgeon: Lafayette Dragon, MD;  Location: [facility] ENDOSCOPY;  Service: Endoscopy;  Laterality: N/A;  . ESOPHAGOGASTRODUODENOSCOPY N/A 03/07/2014   Procedure: ESOPHAGOGASTRODUODENOSCOPY (EGD);  Surgeon: Gatha Mayer, MD;  Location: [facility] ENDOSCOPY;  Service: Endoscopy;  Laterality: N/A;  . LUNG BIOPSY  02/2012  . TOTAL HIP ARTHROPLASTY Left 12/14/2015    Procedure: LEFT TOTAL HIP ARTHROPLASTY ANTERIOR APPROACH;  Surgeon: Mcarthur Rossetti, MD;  Location: [facility];  Service: Orthopedics;  Laterality: Left;  . TOTAL HIP ARTHROPLASTY Right 04/04/2016   Procedure: RIGHT TOTAL HIP ARTHROPLASTY ANTERIOR APPROACH;  Surgeon: Mcarthur Rossetti, MD;  Location: [facility] ORS;  Service: Orthopedics;  Laterality: Right;    There were no vitals filed for this visit.   Subjective Assessment - 09/28/18 1023    Subjective  Pt. with previous history chronic lumbar pain reports new onset/exacerbation beginning about a month ago, no mechanism of injury noted. Symptoms include local lumbar pain as well as radiating pain and "tingling" down right posterolateral thigh distally to knee region. Pt. had MRI 09/12/18 which was (-) findings of neurological compression. Pt. does have history bilateral anterior THA and had right hip MRI 09/26/18 but has not yet heard results. Plan per MD referral is to work on core strengthening and hamstring flexibility with PT.    Pertinent History  Bilat. anterior THA with history AVN, previous lumbar pain with PT last year, history CRPS    Limitations  Standing;Walking;Lifting;House hold activities   bending   How long can you sit comfortably?  30-45 minutes    How long can you stand comfortably?  15-20 minutes    How long can you walk comfortably?  10-15 minutes    Diagnostic tests  MRI lumbar spine 09-12-18    Patient Stated Goals  "  No pain"    Currently in Pain?  Yes    Pain Score  5     Pain Location  Back    Pain Orientation  --   central/mid-line pain with radiating into RLE   Pain Descriptors / Indicators  Sharp    Pain Type  Chronic pain    Pain Radiating Towards  Right posterolateral thigh    Pain Onset  Other (comment)   approximately 1 month ago   Pain Frequency  Constant   Local lumbar pain is constant, right LE radiating pain is intermittent   Aggravating Factors   bending, prolonged standing    Pain Relieving  Factors  rest, laying down    Effect of Pain on Daily Activities  difficulty with chores and tolerating IADLs with prolonged standing and ambulation such as grocery shopping    Multiple Pain Sites  No         [facility] PT Assessment - 09/28/18 0001      Assessment   Medical Diagnosis  LBP with R Sciatica    Referring Provider (PT)  Jean Rosenthal, MD    Hand Dominance  Right    Next MD Visit  none scheduled    Prior Therapy  Past PT last year      Precautions   Precautions  --   history bilat. anterior THA     Restrictions   Weight Bearing Restrictions  No      Balance Screen   Has the patient fallen in the past 6 months  No      Home Environment   Living Environment  --   1 step to enter home     Prior Function   Level of Independence  Independent      Cognition   Overall Cognitive Status  Within Functional Limits for tasks assessed      Sensation   Light Touch  --   Pt. reports mild hyposensitivity RLE with dermatomal screen     ROM / Strength   AROM / PROM / Strength  AROM;PROM;Strength      AROM   AROM Assessment Site  Lumbar;Hip    Right/Left Hip  Right;Left   hip AROM grossly WFL for mat mobility and transfers   Lumbar Flexion  60 deg   increased LBP   Lumbar Extension  20 deg    Lumbar - Right Side Bend  20 deg    Lumbar - Left Side Bend  20 deg    Lumbar - Right Rotation  80%    Lumbar - Left Rotation  70%      PROM   Overall PROM   --   Hip PROM for flexion limited at 80 deg bilat. with guarding     Strength   Strength Assessment Site  Ankle;Knee;Hip    Right/Left Hip  Right;Left    Right Hip Flexion  3+/5    Right Hip Extension  4-/5    Right Hip External Rotation   4-/5    Right Hip Internal Rotation  4/5    Right Hip ABduction  3+/5    Left Hip Flexion  4/5    Left Hip Extension  4/5    Left Hip External Rotation  4/5    Left Hip Internal Rotation  4+/5    Left Hip ABduction  4/5    Right/Left Knee  Right;Left    Right Knee  Flexion  4+/5    Right Knee Extension  4+/5  Left Knee Flexion  4+/5    Left Knee Extension  4+/5    Right/Left Ankle  Right;Left    Right Ankle Dorsiflexion  4+/5    Right Ankle Inversion  4+/5    Right Ankle Eversion  4+/5    Left Ankle Dorsiflexion  4+/5    Left Ankle Inversion  4+/5    Left Ankle Eversion  4+/5      Flexibility   Soft Tissue Assessment /Muscle Length  yes    Hamstrings  R SLR 40 deg, left SLR 50 deg   gross hamstring tightness bilat.     Special Tests    Special Tests  Lumbar    Lumbar Tests  Straight Leg Raise   (+) on right               Objective measurements completed on examination: See above findings.      [place] Adult PT Treatment/Exercise - 09/28/18 0001      Exercises   Exercises  Lumbar      Lumbar Exercises: Stretches   Other Lumbar Stretch Exercise  instructed in seated hamstring stretch      Lumbar Exercises: Supine   Pelvic Tilt  5 reps   instructed HEP   Bent Knee Raise  5 reps   instucted in HEP   Bridge  5 reps   instructed HEP            PT Education - 09/28/18 1208    Education Details  HEP, POC, pain with potential issues central sensitivity, potential for pain despite (-) lumbar MRI findings    Person(s) Educated  Patient    Methods  Explanation;Demonstration;Verbal cues    Comprehension  Verbalized understanding;Returned demonstration       PT Short Term Goals - 09/28/18 1219      PT SHORT TERM GOAL #1   Title  Independent with HEP    Baseline  no HEP    Time  4    Period  Weeks    Status  New      PT SHORT TERM GOAL #2   Title  Increase trunk flexion AROM at least 10-20 dge to improve ability for bending for chores, donning shoes    Baseline  60 deg    Time  4    Period  Weeks    Status  New      PT SHORT TERM GOAL #3   Title  Return demos proper body mechanics for lifting for chores to decrease lumbar strain    Baseline  limited ability to bend/lift for chores    Time  4    Period   Weeks    Status  New                Plan - 09/28/18 1212    Clinical Impression Statement  Pt. presents with lumbar and right LE radiating pain with (-) lumbar MRI. Will await status with hip MRI results to rule out hip pathology as contributing to current symptoms. Exam findings include core and LE weakness and significant hamstring tightness. Pt. would benefit from PT ot address current associated functional limitations.    History and Personal Factors relevant to plan of care:  History of CRPS, bilat. THA, previous lumbar pain episodes    Clinical Presentation  Evolving    Clinical Presentation due to:  potential contribution from hip pending further imaging results    Clinical Decision Making  Moderate    Rehab Potential  Fair    Clinical Impairments Affecting Rehab Potential  history chronic pain and     PT Frequency  1x / week    PT Duration  4 weeks    PT Treatment/Interventions  Electrical Stimulation;Moist Heat;Neuromuscular re-education;Patient/family education;Therapeutic activities;Therapeutic exercise;Dry needling;Manual techniques;Passive range of motion;Functional mobility training;Ultrasound;ADLs/Self Care Home Management    PT Next Visit Plan  Hamstring stretches, progress with mat-based core exercises as tolerated pending pain, pt. has history bilat. ant THA    PT Home Exercise Plan  pelvic tilts, hamstring stretch (seated to start due to significant tightness), hooklying march, hip bridge    Consulted and Agree with Plan of Care  Patient       Patient will benefit from skilled therapeutic intervention in order to improve the following deficits and impairments:  Decreased mobility, Decreased activity tolerance, Decreased endurance, Decreased range of motion, Difficulty walking, Pain  Visit Diagnosis: Chronic right-sided low back pain with right-sided sciatica     Problem List Patient Active Problem List   Diagnosis Date Noted  . Lower abdominal pain  06/21/2018  . Sore throat 05/11/2018  . Encounter for fertility planning 03/30/2018  . Syphilis 02/26/2018  . PID (acute pelvic inflammatory disease) 02/26/2018  . Meningitis 02/18/2018  . Tuberculosis   . Infertility, female 02/12/2018  . Lumbar back pain 10/21/2017  . 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 Presence of right artificial hip joint 08/24/2017  . Right hip pain 08/24/2017  . Acute right-sided low back pain with right-sided sciatica 08/24/2017  . Complex regional pain syndrome 02/03/2017  . Irregular menstrual cycle 02/03/2017  . Headache 10/28/2016  . 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 Avascular necrosis of bone of right hip ([place]) 04/04/2016  . 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 Avascular necrosis of bone of left hip ([place]) 12/14/2015  . 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 Overweight (BMI 25.0-29.9) 04/20/2015  . Seasonal allergies 03/20/2015  . Menorrhagia 03/19/2015  . Sciatica of left side 02/12/2015  . Primary adrenal insufficiency ([place]) 01/03/2015  . Laceration of ankle, right 11/18/2012  . 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 Arthralgia 05/20/2012  . HIV (human immunodeficiency virus infection) ([place]) 03/16/2012  . 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 Iron deficiency anemia 03/11/2012  . Reflux esophagitis 03/11/2012    Beaulah Dinning, PT, DPT 09/28/18 12:25 PM  [place] [facility] [address] [place], [place], [address] Phone: [phone]   Fax:  [phone]  Name: Kelsey Rodriguez MRN: [id number] Date of Birth: 07/26/1991

## 2018-09-28 NOTE — Patient Instructions (Signed)
Exercises  Seated Hamstring Stretch - 2-3 reps - 1 sets - 30 hold - 2x daily - 7x weekly  Supine Pelvic Tilt - 10 reps - 2 sets - 1-2x daily - 7x weekly  Supine March with Posterior Pelvic Tilt - 10 reps - 2 sets - 1-2x daily - 7x weekly  Supine Bridge - 10 reps - 2 sets - 1-2x daily - 7x weekly

## 2018-09-29 ENCOUNTER — Telehealth (INDEPENDENT_AMBULATORY_CARE_PROVIDER_SITE_OTHER): Payer: Self-pay

## 2018-09-29 ENCOUNTER — Encounter (INDEPENDENT_AMBULATORY_CARE_PROVIDER_SITE_OTHER): Payer: Self-pay

## 2018-09-29 ENCOUNTER — Encounter (INDEPENDENT_AMBULATORY_CARE_PROVIDER_SITE_OTHER): Payer: Self-pay | Admitting: Orthopaedic Surgery

## 2018-09-29 ENCOUNTER — Other Ambulatory Visit (INDEPENDENT_AMBULATORY_CARE_PROVIDER_SITE_OTHER): Payer: Self-pay | Admitting: Orthopaedic Surgery

## 2018-09-29 MED ORDER — ACETAMINOPHEN-CODEINE #3 300-30 MG PO TABS: 1.0000 | ORAL_TABLET | Freq: Three times a day (TID) | ORAL | 0 refills | Status: DC | PRN

## 2018-09-29 NOTE — Telephone Encounter (Signed)
I did send some Tylenol with codeine into [facility] for her.  This will be the last pain medication prescription for her from our standpoint.  We have had an MRI of her back it did not show any significant findings and an MRI of her pelvis showing no issues with either hip replacement.  As far as chronic pain goes, she is to see her primary care physician or her hematologist due to her chronic sickle cell anemia.

## 2018-09-29 NOTE — Telephone Encounter (Signed)
Sent patient a message through [place] letting her know the below message

## 2018-09-29 NOTE — Telephone Encounter (Signed)
Requests pain medication refill

## 2018-10-04 ENCOUNTER — Ambulatory Visit: Payer: Medicaid Other

## 2018-10-08 ENCOUNTER — Telehealth: Payer: Self-pay | Admitting: Pharmacist

## 2018-10-08 NOTE — Telephone Encounter (Signed)
Called Geneal to see how she was doing as her HIV viral load was 20,100 back in July indicating she was back to not taking her ART again.  She stated that before she saw Dr. Baxter Flattery she was not doing well with taking it but states she is now back on track. She still misses 4-5 doses per month.  Discussed with her again the dangers of this. Like always, she will try to do better. Made her a f/u with Dr. Baxter Flattery for her first available in December.

## 2018-10-14 ENCOUNTER — Other Ambulatory Visit: Payer: Self-pay

## 2018-10-14 ENCOUNTER — Ambulatory Visit (HOSPITAL_COMMUNITY)
Admission: EM | Admit: 2018-10-14 | Discharge: 2018-10-14 | Disposition: A | Discharge status: Home / Self Care | Payer: Medicaid Other | Attending: Family Medicine | Admitting: Family Medicine

## 2018-10-14 ENCOUNTER — Encounter (HOSPITAL_COMMUNITY): Payer: Self-pay

## 2018-10-14 DIAGNOSIS — L509 Urticaria, unspecified: Secondary | ICD-10-CM | POA: Diagnosis not present

## 2018-10-14 MED ORDER — PREDNISONE 10 MG (21) PO TBPK: ORAL_TABLET | Freq: Every day | ORAL | 0 refills | Status: DC

## 2018-10-14 NOTE — ED Triage Notes (Signed)
Pt c/o states she was bitten by something on her shoulder and back. She not sure of what it was.

## 2018-10-14 NOTE — Discharge Instructions (Signed)
You may also take over the counter Benadryl to help with the itching.

## 2018-10-20 NOTE — ED Provider Notes (Signed)
[facility]   [id number] 10/14/18 Arrival Time: 2094  ASSESSMENT & PLAN:  1. Urticaria     Meds ordered this encounter  Medications  . predniSONE (STERAPRED UNI-PAK 21 TAB) 10 MG (21) TBPK tablet    Sig: Take by mouth daily. Take as directed.    Dispense:  21 tablet    Refill:  0   Benadryl if needed.  Will follow up with PCP or here if worsening or failing to improve as anticipated. Reviewed expectations re: course of current medical issues. Questions answered. Outlined signs and symptoms indicating need for more acute intervention. Patient verbalized understanding. After Visit Summary given.   SUBJECTIVE:  Kelsey Rodriguez is a 27 y.o. female who presents with a skin complaint.   Location: upper L anterior chest; along L and posterior neck; L upper arm Onset: abrupt Duration: started yesterday Associated pruritis? significant Associated pain? none Progression: fluctuating a lot  Drainage? No  Known trigger? No  New soaps/lotions/topicals/detergents/environmental exposures? No Contacts with similar? No Recent travel? No  Other associated symptoms: none Therapies tried thus far: Benadryl with only mild and temporary help. Arthralgia or myalgia? none Recent illness? none Fever? none No specific aggravating or alleviating factors reported.  ROS: As per HPI.  OBJECTIVE: Vitals:   10/14/18 1833 10/14/18 1834  BP: 129/72   Pulse: 92   Temp: 97.9 F (36.6 C)   TempSrc: Oral   SpO2: 100%   Weight:  78.9 kg    General appearance: alert; no distress Lungs: clear to auscultation bilaterally Heart: regular rate and rhythm Extremities: no edema Skin: warm and dry; smooth, slightly elevated and erythematous plaques of variable size over her torso, neck, and upper L arm Psychological: alert and cooperative; normal mood and affect  Allergies  Allergen Reactions  . Hydrocodone Itching and Nausea Only    Tolerates Oxycodone  . Tramadol Itching  and Nausea Only    Tolerates oxycodone    Past Medical History:  Diagnosis Date  . 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 Adrenal insufficiency ([place])   . Anemia of chronic disease 03/11/2012  . Back pain of lumbar region with sciatica 02/12/2015  . Bell's palsy 08/26/2013  . 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 Chronic back pain   . Chronic leg pain    bilateral knees, ankles  . Fatigue   . 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 HIV (human immunodeficiency virus infection) ([place]) 02/2012  . Laceration of ankle, right 11/18/2012  . Lumbar radiculopathy   . Pelvic pain   . Reflux esophagitis 03/11/2012  . Tuberculosis   . 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 Vertigo    Social History   Socioeconomic History  . Marital status: Married    Spouse name: Not on file  . Number of children: 0  . Years of education: college  . Highest education level: Not on file  Occupational History  . Not on file  Social Needs  . Financial resource strain: Not on file  . Food insecurity:    Worry: Not on file    Inability: Not on file  . Transportation needs:    Medical: Not on file    Non-medical: Not on file  Tobacco Use  . Smoking status: Never Smoker  . Smokeless tobacco: Never Used  Substance and Sexual Activity  . Alcohol use: No    Alcohol/week: 0.0 standard drinks    Comment: socially  . Drug use: No  . Sexual activity: Not Currently  Partners: Male    Birth control/protection: None    Comment: pt. given condoms  Lifestyle  . Physical activity:    Days per week: Not on file    Minutes per session: Not on file  . Stress: Not on file  Relationships  . Social connections:    Talks on phone: Not on file    Gets together: Not on file    Attends religious service: Not on file    Active member of club or organization: Not on file    Attends meetings of clubs or organizations: Not on file    Relationship status: Not on file  . Intimate partner violence:    Fear of current  or ex partner: Not on file    Emotionally abused: Not on file    Physically abused: Not on file    Forced sexual activity: Not on file  Other Topics Concern  . 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     Drinks about 1 soda a day    Family History  Problem Relation Age of Onset  . Heart disease Father        Vague not clearly cardiac   Past Surgical History:  Procedure Laterality Date  . APPENDECTOMY  ~ 2000  . DILATION AND CURETTAGE OF UTERUS  2008  . ESOPHAGOGASTRODUODENOSCOPY  03/11/2012   Procedure: ESOPHAGOGASTRODUODENOSCOPY (EGD);  Surgeon: Lafayette Dragon, MD;  Location: [facility] ENDOSCOPY;  Service: Endoscopy;  Laterality: N/A;  . ESOPHAGOGASTRODUODENOSCOPY N/A 03/07/2014   Procedure: ESOPHAGOGASTRODUODENOSCOPY (EGD);  Surgeon: Gatha Mayer, MD;  Location[facility] ENDOSCOPY;  Service: Endoscopy;  Laterality: N/A;  . LUNG BIOPSY  02/2012  . TOTAL HIP ARTHROPLASTY Left 12/14/2015   Procedure: LEFT TOTAL HIP ARTHROPLASTY ANTERIOR APPROACH;  Surgeon: Mcarthur Rossetti, MD;  Location: [facility] ORS;  Service: Orthopedics;  Laterality: Left;  . TOTAL HIP ARTHROPLASTY Right 04/04/2016   Procedure: RIGHT TOTAL HIP ARTHROPLASTY ANTERIOR APPROACH;  Surgeon: Mcarthur Rossetti, MD;  Location: [facility] ORS;  Service: Orthopedics;  Laterality: Right;     Vanessa Kick, MD 10/20/18 [phone]

## 2018-12-01 ENCOUNTER — Ambulatory Visit: Payer: Medicaid Other | Admitting: Internal Medicine

## 2018-12-30 ENCOUNTER — Ambulatory Visit: Payer: Medicaid Other | Admitting: Student in an Organized Health Care Education/Training Program

## 2018-12-31 ENCOUNTER — Ambulatory Visit (INDEPENDENT_AMBULATORY_CARE_PROVIDER_SITE_OTHER): Payer: Medicaid Other | Admitting: Family Medicine

## 2018-12-31 ENCOUNTER — Other Ambulatory Visit: Payer: Self-pay

## 2018-12-31 VITALS — BP 100/74 | HR 102 | Temp 98.7°F | Ht 65.0 in | Wt 174.0 lb

## 2018-12-31 DIAGNOSIS — A539 Syphilis, unspecified: Secondary | ICD-10-CM | POA: Diagnosis present

## 2018-12-31 DIAGNOSIS — R102 Pelvic and perineal pain: Secondary | ICD-10-CM

## 2018-12-31 DIAGNOSIS — Z3202 Encounter for pregnancy test, result negative: Secondary | ICD-10-CM | POA: Diagnosis not present

## 2018-12-31 DIAGNOSIS — G8929 Other chronic pain: Secondary | ICD-10-CM

## 2018-12-31 DIAGNOSIS — R109 Unspecified abdominal pain: Secondary | ICD-10-CM | POA: Diagnosis not present

## 2018-12-31 LAB — POCT URINALYSIS DIP (MANUAL ENTRY)
Bilirubin, UA: NEGATIVE
Blood, UA: NEGATIVE
Glucose, UA: NEGATIVE mg/dL
Leukocytes, UA: NEGATIVE
Nitrite, UA: NEGATIVE
Protein Ur, POC: NEGATIVE mg/dL
Spec Grav, UA: 1.025 (ref 1.010–1.025)
Urobilinogen, UA: 0.2 E.U./dL
pH, UA: 6.5 (ref 5.0–8.0)

## 2018-12-31 LAB — POCT URINE PREGNANCY: Preg Test, Ur: NEGATIVE

## 2018-12-31 NOTE — Patient Instructions (Signed)
It was a pleasure to see you today! Thank you for choosing [facility] for your primary care. Kelsey Rodriguez was seen for chronic pelvic pain.   Our plans for today were:  We will call you with the RPR lab.   [facility] should call you with an appt.   Best,  Dr. Lindell Noe

## 2018-12-31 NOTE — Progress Notes (Signed)
   CC: lower abdominal pain, vulvodynia  HPI  Abdominal pain - present for years. Previous gyn told her the pain was due to cysts and some inflammation around her uterus. Pain with walking around, pain with intercourse. Was treated for PID last year. Pain is bilateral, almost constant, goes to her back, standing longer hurts. Pain with intercourse, mostly with penetration of the introitus. Takes miralax every day - has BMs about every day. Also using stool softeners. Notes less appetite for the last 2-3 weeks. Not eating a lot. Denies dysuria.   HIV - sees [facility]. Had previous positive RPR, she had trepnonemal tests at [facility] in March that appear to show old infection, but Dr. Baxter Flattery wanted repeat RPR in July. No new partners or concern for new infection.   ROS: Denies CP, SOB, abdominal pain, dysuria, changes in BMs.   CC, SH/smoking status, and VS noted  Objective: BP 100/74   Pulse (!) 102   Temp 98.7 F (37.1 C) (Oral)   Ht 5\' 5"  (1.651 m)   Wt 174 lb (78.9 kg)   LMP 12/14/2018 (Exact Date)   SpO2 99%   BMI 28.96 kg/m  Gen: NAD, alert, cooperative, and pleasant. CV: RRR, no murmur Resp: CTAB, no wheezes, non-labored Abd: TTP over bilateral lower quadrants, no guarding or peritoneal signs Ext: No edema, warm Neuro: Alert and oriented, Speech clear, No gross deficits  Assessment and plan:  Syphilis Will repeat RPR per Dr. Storm Frisk orders - asked our lab manager, the treponemal tests should reflex from positive RPR. Will call w results. No signs of new infection.   Chronic pelvic pain in female We deferred pelvic exam in this patient with c/o vulvodynia and low suspicision for new PID. Suspect there is some component of chronic PID related pain vs endometriosis. We will refer to Dr. Illene Bolus, a gynecologist specializing in pelvic pain through [facility]. Pt with concerns about whether Dr. Hiram Comber will take medicaid, but I suspect she will as she is in a teaching program. Referral placed.     Orders Placed This Encounter  Procedures  . RPR  . Ambulatory referral to Gynecology    Referral Priority:   Routine    Referral Type:   Consultation    Referral Reason:   Specialty Services Required    Requested Specialty:   Gynecology    Number of Visits Requested:   1  . POCT urine pregnancy  . POCT urinalysis dipstick    No orders of the defined types were placed in this encounter.   Ralene Ok, MD, PGY3 01/04/2019 7:40 AM

## 2019-01-04 DIAGNOSIS — R102 Pelvic and perineal pain: Secondary | ICD-10-CM

## 2019-01-04 DIAGNOSIS — G8929 Other chronic pain: Secondary | ICD-10-CM | POA: Insufficient documentation

## 2019-01-04 HISTORY — DX: Other chronic pain: G89.29

## 2019-01-04 NOTE — Assessment & Plan Note (Addendum)
We deferred pelvic exam in this patient with c/o vulvodynia and low suspicision for new PID. Suspect there is some component of chronic PID related pain vs endometriosis. We will refer to Dr. Illene Bolus, a gynecologist specializing in pelvic pain through [facility]. Pt with concerns about whether Dr. Hiram Comber will take medicaid, but I suspect she will as she is in a teaching program. Referral placed.

## 2019-01-04 NOTE — Assessment & Plan Note (Signed)
Will repeat RPR per Dr. Storm Frisk orders - asked our lab manager, the treponemal tests should reflex from positive RPR. Will call w results. No signs of new infection.

## 2019-01-05 ENCOUNTER — Telehealth: Payer: Self-pay | Admitting: Family Medicine

## 2019-01-05 LAB — RPR: RPR Ser Ql: REACTIVE — AB

## 2019-01-05 LAB — RPR, QUANT+TP ABS (REFLEX)
Rapid Plasma Reagin, Quant: 1:8 {titer} — ABNORMAL HIGH
T Pallidum Abs: NONREACTIVE

## 2019-01-05 NOTE — Telephone Encounter (Signed)
Called patient with lab results - RPR marginally changed. Treponemal abs non reactive, RPR reactive, quant 1:8 from 1:4 previously. Suspect she may not ever clear her quant given concomitant HIV. Will route to Dr. Baxter Flattery as Juluis Rainier as this was also a future order from their last visit. Instructed patient no need for therapy for these values, continue her HIV treatment.

## 2019-01-17 ENCOUNTER — Ambulatory Visit (INDEPENDENT_AMBULATORY_CARE_PROVIDER_SITE_OTHER): Payer: Medicaid Other | Admitting: Orthopaedic Surgery

## 2019-01-17 ENCOUNTER — Ambulatory Visit (INDEPENDENT_AMBULATORY_CARE_PROVIDER_SITE_OTHER): Payer: Self-pay

## 2019-01-17 ENCOUNTER — Encounter (INDEPENDENT_AMBULATORY_CARE_PROVIDER_SITE_OTHER): Payer: Self-pay | Admitting: Orthopaedic Surgery

## 2019-01-17 ENCOUNTER — Encounter: Payer: Self-pay | Admitting: Family Medicine

## 2019-01-17 ENCOUNTER — Other Ambulatory Visit: Payer: Self-pay

## 2019-01-17 ENCOUNTER — Ambulatory Visit (INDEPENDENT_AMBULATORY_CARE_PROVIDER_SITE_OTHER): Payer: Medicaid Other | Admitting: Family Medicine

## 2019-01-17 VITALS — BP 104/62 | HR 101 | Temp 99.3°F | Ht 65.0 in | Wt 175.8 lb

## 2019-01-17 DIAGNOSIS — M7582 Other shoulder lesions, left shoulder: Secondary | ICD-10-CM | POA: Diagnosis not present

## 2019-01-17 DIAGNOSIS — M25561 Pain in right knee: Secondary | ICD-10-CM | POA: Diagnosis not present

## 2019-01-17 DIAGNOSIS — Z96643 Presence of artificial hip joint, bilateral: Secondary | ICD-10-CM

## 2019-01-17 MED ORDER — LIDOCAINE HCL 1 % IJ SOLN
3.0000 mL | INTRAMUSCULAR | Status: AC | PRN
  Administered 2019-01-17: 3 mL

## 2019-01-17 MED ORDER — MELOXICAM 15 MG PO TABS: 15.0000 mg | ORAL_TABLET | Freq: Every day | ORAL | 0 refills | Status: AC

## 2019-01-17 MED ORDER — METHYLPREDNISOLONE ACETATE 40 MG/ML IJ SUSP
40.0000 mg | INTRAMUSCULAR | Status: AC | PRN
  Administered 2019-01-17: 40 mg via INTRA_ARTICULAR

## 2019-01-17 NOTE — Progress Notes (Signed)
Subjective: Chief Complaint  Patient presents with  . pain at surgery site    HPI: Kelsey Rodriguez is a 28 y.o. presenting to clinic today to discuss the following:  Left Shoulder Pain Patient presents today for left shoulder pain that has been on going for "a long time" but just recently in the past week has gotten worse and is not getting better. She states usually her shoulder will hurt and she will rest and the pain will improve. However, over the past week it has not improved with rest and she is now limited in doing activities around the house like mopping, sweeping, and even washing her hair in the shower. The pain is intermittent occurs with activity and is described as a "crampy pain" located in her left shoulder that radiates to her shoulder blade. Patient endorses she does not sleep on that shoulder b/c the pain will wake her up at night. She also states it hurt when she takes a deep breath.  She does not smoke, has not taken OCPs in over 3-4 years, and has no personal history or family history of blood clots.  She denies fever, chills, chest pain, SOB, abdominal pain, nausea, vomiting, diarrhea, or constipation    ROS noted in HPI.   Past Medical, Surgical, Social, and Family History Reviewed & Updated per EMR.   Pertinent Historical Findings include:   Social History   Tobacco Use  Smoking Status Never Smoker  Smokeless Tobacco Never Used    Objective: BP 104/62   Pulse (!) 101   Temp 99.3 F (37.4 C) (Oral)   Ht 5\' 5"  (1.651 m)   Wt 175 lb 12.8 oz (79.7 kg)   SpO2 99%   BMI 29.25 kg/m  Vitals and nursing notes reviewed  Physical Exam Gen: Alert and Oriented x 3, NAD HEENT: Normocephalic, atraumatic Neck: supple CV: RRR, no murmurs, normal S1, S2 split Resp: CTAB, no wheezing, rales, or rhonchi, comfortable work of breathing MSK  Left Shoulder: no obvious or gross deformities, TTP along the anterior portion of the left shoulder and mildly tender  along the left scapular spine and acromion. Decreased ROM in flexion and external rotation of the left shoulder, otherwise FROM, 5/5 strength in UE bilaterally, gross sensation intact bilaterally, +2 radial pulses bilaterally. Positive Hawkins, O'briens test and pushoff test with negative empty can test and cross over test. Ext: no clubbing, cyanosis, or edema Skin: warm, dry, intact, no rashes  Imaging Left shoulder U/S Long head of biceps viewed in long and short axis views with no signs of inflammation or surrounding fluid. Subscapularis, Infraspinatus, and Teres Minor all seen and no acute or inflammatory findings found. Supraspinatus did show signs of calcification and scarring possibly from chronic injury but no acute tear. Dynamic view of the supraspinatus did show possible mild impingement.  Assessment/Plan:  Rotator cuff tendonitis, left Given history, symptoms, exam, and imaging results her findings are most consistent with rotator cuff tendonitis specifically of the supraspinatus tendon. She could also have a component of arthritis although less likely given her age. U/S did show signs of impingement. - Home exercises given to do 2 times per day for the next month - Meloxicam 15mg  once daily with food for 2 weeks - Patient was instructed to request an appointment with me in 2 weeks for left shoulder injection if no improvement in that time frame.   PATIENT EDUCATION PROVIDED: See AVS    Diagnosis and plan along with any  newly prescribed medication(s) were discussed in detail with this patient today. The patient verbalized understanding and agreed with the plan. Patient advised if symptoms worsen return to clinic or ER.    Meds ordered this encounter  Medications  . meloxicam (MOBIC) 15 MG tablet    Sig: Take 1 tablet (15 mg total) by mouth daily for 14 days.    Dispense:  14 tablet    Refill:  0     Harolyn Rutherford, DO 01/17/2019, 4:18 PM PGY-2 [place]

## 2019-01-17 NOTE — Patient Instructions (Addendum)
It was great to see you today! Thank you for letting me participate in your care!  Today, we discussed your left shoulder pain and I believe you have some rotator cuff tendonitis. I have sent in a medication called Meloxicam to take once per day for two weeks. I have also attached some exercises to do to help. If you have no improvement please return to the clinic and ask for an appointment with me and I will inject your shoulder with a medication.   Rotator Cuff Tendinitis  Rotator cuff tendinitis is inflammation of the tough, cord-like bands that connect muscle to bone (tendons) in the rotator cuff. The rotator cuff includes all of the muscles and tendons that connect the arm to the shoulder. The rotator cuff holds the head of the upper arm bone (humerus) in the cup (fossa) of the shoulder blade (scapula). This condition can lead to a long-lasting (chronic) tear. The tear may be partial or complete. What are the causes? This condition is usually caused by overusing the rotator cuff. What increases the risk? This condition is more likely to develop in athletes and workers who frequently use their shoulder or reach over their heads. This can include activities such as:  Tennis.  Baseball or softball.  Swimming.  Construction work.  Painting. What are the signs or symptoms? Symptoms of this condition include:  Pain spreading (radiating) from the shoulder to the upper arm.  Swelling and tenderness in front of the shoulder.  Pain when reaching, pulling, or lifting the arm above the head.  Pain when lowering the arm from above the head.  Minor pain in the shoulder when resting.  Increased pain in the shoulder at night.  Difficulty placing the arm behind the back. How is this diagnosed? This condition is diagnosed with a medical history and physical exam. Tests may also be done, including:  X-rays.  MRI.  Ultrasounds.  CT or MR arthrogram. During this test, a contrast  material is injected and then images are taken. How is this treated? Treatment for this condition depends on the severity of the condition. In less severe cases, treatment may include:  Rest. This may be done with a sling that holds the shoulder still (immobilization). Your health care provider may also recommend avoiding activities that involve lifting your arm over your head.  Icing the shoulder.  Anti-inflammatory medicines, such as aspirin or ibuprofen. In more severe cases, treatment may include:  Physical therapy.  Steroid injections.  Surgery. Follow these instructions at home: If you have a sling:  Wear the sling as told by your health care provider. Remove it only as told by your health care provider.  Loosen the sling if your fingers tingle, become numb, or turn cold and blue.  Keep the sling clean.  If the sling is not waterproof, do not let it get wet. Remove it, if allowed, or cover it with a watertight covering when you take a bath or shower. Managing pain, stiffness, and swelling  If directed, put ice on the injured area. ? If you have a removable sling, remove it as told by your health care provider. ? Put ice in a plastic bag. ? Place a towel between your skin and the bag. ? Leave the ice on for 20 minutes, 2-3 times a day.  Move your fingers often to avoid stiffness and to lessen swelling.  Raise (elevate) the injured area above the level of your heart while you are lying down.  Find a  comfortable sleeping position or sleep on a recliner, if available. Driving  Do not drive or use heavy machinery while taking prescription pain medicine.  Ask your health care provider when it is safe to drive if you have a sling on your arm. Activity  Rest your shoulder as told by your health care provider.  Return to your normal activities as told by your health care provider. Ask your health care provider what activities are safe for you.  Do any exercises or  stretches as told by your health care provider.  If you do repetitive overhead tasks, take small breaks in between and include stretching exercises as told by your health care provider. General instructions  Do not use any products that contain nicotine or tobacco, such as cigarettes and e-cigarettes. These can delay healing. If you need help quitting, ask your health care provider.  Take over-the-counter and prescription medicines only as told by your health care provider.  Keep all follow-up visits as told by your health care provider. This is important. Contact a health care provider if:  Your pain gets worse.  You have new pain in your arm, hands, or fingers.  Your pain is not relieved with medicine or does not get better after 6 weeks of treatment.  You have cracking sensations when moving your shoulder in certain directions.  You hear a snapping sound after using your shoulder, followed by severe pain and weakness. Get help right away if:  Your arm, hand, or fingers are numb or tingling.  Your arm, hand, or fingers are swollen or painful or they turn white or blue. Summary  Rotator cuff tendinitis is inflammation of the tough, cord-like bands that connect muscle to bone (tendons) in the rotator cuff.  This condition is usually caused by overusing the rotator cuff, which includes all of the muscles and tendons that connect the arm to the shoulder.  This condition is more likely to develop in athletes and workers who frequently use their shoulder or reach over their heads.  Treatment generally includes rest, anti-inflammatory medicines, and icing. In some cases, physical therapy and steroid injections may be needed. In severe cases, surgery may be needed. This information is not intended to replace advice given to you by your health care provider. Make sure you discuss any questions you have with your health care provider. Document Released: 02/21/2004 Document Revised:  11/17/2016 Document Reviewed: 11/17/2016 Elsevier Interactive Patient Education  2019 [employer], Harolyn Rutherford, DO PGY-2, [facility]

## 2019-01-17 NOTE — Progress Notes (Signed)
Office Visit Note   Patient: Kelsey Rodriguez           Date of Birth: Jan 25, 1991           MRN: [id number] Visit Date: 01/17/2019              Requested by: Everrett Coombe, MD [address] [place], [place] [address] PCP: Everrett Coombe, MD   Assessment & Plan: Visit Diagnoses:  1. Acute pain of right knee     Plan: I did recommend a steroid injection for her right knee to see if we can calm down her acute pain and she agreed with this.  I explained the risk and benefits of steroid injections as well.  She did tolerate it well.  We will see her back in about 3 weeks to see how she is doing overall.  Follow-Up Instructions: Return in about 3 weeks (around 02/07/2019).   Orders:  Orders Placed This Encounter  Procedures  . Large Joint Inj  . XR Knee 1-2 Views Right   No orders of the defined types were placed in this encounter.     Procedures: Large Joint Inj: R knee on 01/17/2019 9:38 AM Indications: diagnostic evaluation and pain Details: 22 G 1.5 in needle, superolateral approach  Arthrogram: No  Medications: 3 mL lidocaine 1 %; 40 mg methylPREDNISolone acetate 40 MG/ML Outcome: tolerated well, no immediate complications Procedure, treatment alternatives, risks and benefits explained, specific risks discussed. Consent was given by the patient. Immediately prior to procedure a time out was called to verify the correct patient, procedure, equipment, support staff and site/side marked as required. Patient was prepped and draped in the usual sterile fashion.       Clinical Data: No additional findings.   Subjective: Chief Complaint  Patient presents with  . Right Knee - Pain  The patient is a 28 year old well-known to [place].  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  She has been having a 2-week history of right knee pain.  She has no known injury.  She denies any swelling the use it hurts most with activities.  She  has trouble getting out of bed and going up and down stairs.  She denies any recent sickle cell crises.  She still dealing with chronic back pain and chronic pain in general as a relates to her hip replacements.  HPI  Review of Systems She currently denies any headache, chest pain, shortness of breath, fever, chills, nausea, vomiting  Objective: Vital Signs: There were no vitals taken for this visit.  Physical Exam She is alert and orient x3 and in no acute distress Ortho Exam Examination of her right knee shows no effusion.  The right knee has full range of motion and is ligamentously stable.  She does have some slight medial joint line tenderness. Specialty Comments:  No specialty comments available.  Imaging: Xr Knee 1-2 Views Right  Result Date: 01/17/2019 2 views of the right knee show no acute findings.  The alignment and joint space is well-maintained.  There is no effusion.    PMFS History: Patient Active Problem List   Diagnosis Date Noted  . Chronic pelvic pain in female 01/04/2019  . Lower abdominal pain 06/21/2018  . Sore throat 05/11/2018  . Encounter for fertility planning 03/30/2018  . Syphilis 02/26/2018  . PID (acute pelvic inflammatory disease) 02/26/2018  . Meningitis 02/18/2018  . Tuberculosis   . Infertility, female 02/12/2018  .  Lumbar back pain 10/21/2017  . 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 Presence of right artificial hip joint 08/24/2017  . Right hip pain 08/24/2017  . Acute right-sided low back pain with right-sided sciatica 08/24/2017  . Complex regional pain syndrome 02/03/2017  . Irregular menstrual cycle 02/03/2017  . Headache 10/28/2016  . 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 Avascular necrosis of bone of right hip ([place]) 04/04/2016  . 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 Avascular necrosis of bone of left hip ([place]) 12/14/2015  . 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  Overweight (BMI 25.0-29.9) 04/20/2015  . Seasonal allergies 03/20/2015  . Menorrhagia 03/19/2015  . Sciatica of left side 02/12/2015  . Primary adrenal insufficiency ([place]) 01/03/2015  . Laceration of ankle, right 11/18/2012  . 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 Arthralgia 05/20/2012  . HIV (human immunodeficiency virus infection) ([place]) 03/16/2012  . 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 Iron deficiency anemia 03/11/2012  . Reflux esophagitis 03/11/2012   Past Medical History:  Diagnosis Date  . 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 Adrenal insufficiency ([place])   . Anemia of chronic disease 03/11/2012  . Back pain of lumbar region with sciatica 02/12/2015  . Bell's palsy 08/26/2013  . 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 Chronic back pain   . Chronic leg pain    bilateral knees, ankles  . Fatigue   . 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 HIV (human immunodeficiency virus infection) ([place]) 02/2012  . Laceration of ankle, right 11/18/2012  . Lumbar radiculopathy   . Pelvic pain   . Reflux esophagitis 03/11/2012  . Tuberculosis   . 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 Vertigo     Family History  Problem Relation Age of Onset  . Heart disease Father        Vague not clearly cardiac    Past Surgical History:  Procedure Laterality Date  . APPENDECTOMY  ~ 2000  . DILATION AND CURETTAGE OF UTERUS  2008  . ESOPHAGOGASTRODUODENOSCOPY  03/11/2012   Procedure: ESOPHAGOGASTRODUODENOSCOPY (EGD);  Surgeon: Lafayette Dragon, MD;  Location: [facility] ENDOSCOPY;  Service: Endoscopy;  Laterality: N/A;  . ESOPHAGOGASTRODUODENOSCOPY N/A 03/07/2014   Procedure: ESOPHAGOGASTRODUODENOSCOPY (EGD);  Surgeon: Gatha Mayer, MD;  Location: [facility] ENDOSCOPY;  Service: Endoscopy;  Laterality: N/A;  . LUNG BIOPSY  02/2012  . TOTAL HIP ARTHROPLASTY Left 12/14/2015   Procedure: LEFT TOTAL HIP ARTHROPLASTY ANTERIOR APPROACH;  Surgeon: Mcarthur Rossetti, MD;  Location: [facility] ORS;  Service: Orthopedics;  Laterality:  Left;  . TOTAL HIP ARTHROPLASTY Right 04/04/2016   Procedure: RIGHT TOTAL HIP ARTHROPLASTY ANTERIOR APPROACH;  Surgeon: Mcarthur Rossetti, MD;  Location: [facility] ORS;  Service: Orthopedics;  Laterality: Right;   Social History   Occupational History  . Not on file  Tobacco Use  . Smoking status: Never Smoker  . Smokeless tobacco: Never Used  Substance and Sexual Activity  . Alcohol use: No    Alcohol/week: 0.0 standard drinks    Comment: socially  . Drug use: No  . Sexual activity: Not Currently    Partners: Male    Birth control/protection: None    Comment: pt. given condoms

## 2019-01-18 DIAGNOSIS — M67912 Unspecified disorder of synovium and tendon, left shoulder: Secondary | ICD-10-CM

## 2019-01-18 HISTORY — DX: Unspecified disorder of synovium and tendon, left shoulder: M67.912

## 2019-01-18 NOTE — Assessment & Plan Note (Signed)
Given history, symptoms, exam, and imaging results her findings are most consistent with rotator cuff tendonitis specifically of the supraspinatus tendon. She could also have a component of arthritis although less likely given her age. U/S did show signs of impingement. - Home exercises given to do 2 times per day for the next month - Meloxicam 15mg  once daily with food for 2 weeks - Patient was instructed to request an appointment with me in 2 weeks for left shoulder injection if no improvement in that time frame.

## 2019-01-31 ENCOUNTER — Ambulatory Visit (INDEPENDENT_AMBULATORY_CARE_PROVIDER_SITE_OTHER): Payer: Medicaid Other | Admitting: Student in an Organized Health Care Education/Training Program

## 2019-01-31 ENCOUNTER — Other Ambulatory Visit: Payer: Self-pay

## 2019-01-31 ENCOUNTER — Encounter: Payer: Self-pay | Admitting: Student in an Organized Health Care Education/Training Program

## 2019-01-31 VITALS — BP 124/68 | HR 80 | Temp 98.0°F | Ht 65.0 in | Wt 175.0 lb

## 2019-01-31 DIAGNOSIS — M67912 Unspecified disorder of synovium and tendon, left shoulder: Secondary | ICD-10-CM | POA: Diagnosis present

## 2019-01-31 MED ORDER — DEXAMETHASONE SODIUM PHOSPHATE 100 MG/10ML IJ SOLN: 10.0000 mg | Freq: Once | INTRAMUSCULAR | Status: DC

## 2019-01-31 MED ORDER — DEXAMETHASONE SODIUM PHOSPHATE 10 MG/ML IJ SOLN: 10.0000 mg | Freq: Once | INTRAMUSCULAR | Status: DC

## 2019-01-31 MED ORDER — DEXAMETHASONE SODIUM PHOSPHATE 10 MG/ML IJ SOLN
10.0000 mg | Freq: Once | INTRAMUSCULAR | Status: AC
  Administered 2019-01-31: 10 mg via INTRA_ARTICULAR

## 2019-01-31 NOTE — Assessment & Plan Note (Addendum)
Exam is not clear-cut tendinopathy, though she does have some supraspinatous pain. Her pain seems to radiate from area in her left upper lung where she prevoiusly underwent biopsy. She has tried gabapentin in the past without improvement in symptoms. She has had steroid injections of the knee which were very helpful to her. She would like to try a steroid injection of the shoulder at today's visit. - no red flags for joint infection - 10 mg decadron left subacromial joint injection today in the office - continue ibupron PRN pain - encourage continued movement at the joint - continue ROM exercises - PT referral placed today - return precautions provided - follow up as needed

## 2019-01-31 NOTE — Patient Instructions (Signed)
It was a pleasure seeing you today in our clinic. Today we discussed your shoulder pain and did a Left shoulder steroid injection. Here is the treatment plan we have discussed and agreed upon together:  - You may be sore over the next day, however please continue to move around so the joint does not get stiff. - Use ibuprofen as needed for pain at home - If you notice an area of redness with warmth, swelling, pain, or you develop fevers, these would be reasons to call our office back or come in to be seen.  Our clinic's number is [phone]. Please call with questions or concerns about what we discussed today.  Be well, Dr. Burr Medico

## 2019-01-31 NOTE — Progress Notes (Addendum)
CC: left shoulder pain, follow up  HPI: Kelsey Rodriguez is a 28 y.o. female with an extensive PMH including HIV, complex regional pain syndrome  Left shoulder pain, follow up: Patient was previously seen and examined in our office by Dr. Garlan Fillers.  She has left shoulder pain that is worse with activity.  She reports pain when she pushes the vacuum, and when she washes her hair in the shower.  This is been going on for a long time.  She reports that she has used the meloxicam which was prescribed at her last visit.  She reports that she did the end of motion exercises about 3 times per week.  Not feel that her symptoms have much improved.  Of note patient does have a history of lung biopsy in that area with subsequent complex regional pain syndrome.  She has tried gabapentin in the past without significant improvement.  She feels that her complex regional pain syndrome is related to her shoulder pain.  She has not had any fevers.  She has no known history of injury or trauma in that shoulder.  Informal left shoulder ultrasound at last visit 01/17/2019 Supraspinatus did show signs of calcification and scarring possibly from chronic injury but no acute tear. Dynamic view of the supraspinatus did show possible mild impingement.   Review of Symptoms:  See HPI for ROS.   CC, SH/smoking status, and VS noted.  Objective: BP 124/68 (BP Location: Right Arm, Patient Position: Sitting, Cuff Size: Normal)   Pulse 80   Temp 98 F (36.7 C) (Oral)   Ht 5\' 5"  (1.651 m)   Wt 175 lb (79.4 kg)   SpO2 99%   BMI 29.12 kg/m  GEN: NAD, alert, cooperative, and pleasant. RESPIRATORY: Comfortable work of breathing, speaks in full sentences CV: Regular rate noted, distal extremities well perfused and warm without edema GI: Soft, nondistended SKIN: warm and dry, no rashes or lesions NEURO: II-XII grossly intact MSK: Moves 4 extremities equally PSYCH: AAOx3, appropriate affect  Shoulder, left: TTP noted at  the anterior shoulder, along the supraspinatous and on the anterior upper left chest wall. No evidence of bony deformity, asymmetry, or muscle atrophy; No tenderness over long head of biceps (bicipital groove). No TTP at [facility] joint. Full active and passive range of motion,Thumb to T12 without significant tenderness. Strength 5/5 throughout. No abnormal scapular function observed. Sensation intact. Peripheral pulses intact.  Special Tests:   - Crossarm test: Positive   - Hawkins: Positive   - Obrien's test: Positive  INJECTION: Patient was given informed consent, signed copy in the chart. Appropriate time out was taken. Area prepped and draped in usual sterile fashion. 1 cc of decadron 10 mg/ml plus  4 cc of 1% lidocaine without epinephrine was injected into the left subacromial area using a(n) posterior approach. The patient tolerated the procedure well. There were no complications. Post procedure instructions were given.  Assessment and plan:  Tendinopathy of left shoulder Exam is not clear-cut tendinopathy, though she does have some supraspinatous pain. Her pain seems to radiate from area in her left upper lung where she prevoiusly underwent biopsy. She has tried gabapentin in the past without improvement in symptoms. She has had steroid injections of the knee which were very helpful to her. She would like to try a steroid injection of the shoulder at today's visit. - no red flags for joint infection - 10 mg decadron left subacromial joint injection today in the office - continue ibupron PRN  pain - encourage continued movement at the joint - continue ROM exercises - PT referral placed today - return precautions provided - follow up as needed   Orders Placed This Encounter  Procedures  . Ambulatory referral to Physical Therapy    Referral Priority:   Routine    Referral Type:   Physical Medicine    Referral Reason:   Specialty Services Required    Requested Specialty:   Physical Therapy      Number of Visits Requested:   1    Meds ordered this encounter  Medications  . dexamethasone (DECADRON) injection 10 mg    Everrett Coombe, MD,MS,  PGY3 01/31/2019 11:26 AM

## 2019-02-05 ENCOUNTER — Other Ambulatory Visit: Payer: Self-pay | Admitting: Family Medicine

## 2019-02-11 ENCOUNTER — Ambulatory Visit: Payer: Medicaid Other | Admitting: Physical Therapy

## 2019-02-17 ENCOUNTER — Ambulatory Visit: Payer: Medicaid Other | Attending: Physical Therapy | Admitting: Physical Therapy

## 2019-02-17 ENCOUNTER — Telehealth: Payer: Self-pay

## 2019-02-17 NOTE — Telephone Encounter (Signed)
Incoming fax received from [facility] for medical Consultation request. Form placed in MD boxed for signature.  Eugenia Mcalpine, LPN

## 2019-03-19 ENCOUNTER — Other Ambulatory Visit: Payer: Self-pay | Admitting: Internal Medicine

## 2019-03-21 ENCOUNTER — Other Ambulatory Visit: Payer: Self-pay | Admitting: Pharmacist

## 2019-03-21 DIAGNOSIS — B20 Human immunodeficiency virus [HIV] disease: Secondary | ICD-10-CM

## 2019-03-21 MED ORDER — DARUN-COBIC-EMTRICIT-TENOFAF 800-150-200-10 MG PO TABS: 1.0000 | ORAL_TABLET | Freq: Every day | ORAL | 2 refills | Status: DC

## 2019-03-21 NOTE — Progress Notes (Signed)
Patient needs refills of Symtuza.  She is very nonadherent to her medication and needs an appointment but will send in refills for now. Hopefully we can get her back in here after the [place] pandemic is over.

## 2019-04-18 ENCOUNTER — Encounter: Payer: Self-pay | Admitting: Orthopaedic Surgery

## 2019-04-18 ENCOUNTER — Other Ambulatory Visit: Payer: Self-pay

## 2019-04-18 ENCOUNTER — Ambulatory Visit (INDEPENDENT_AMBULATORY_CARE_PROVIDER_SITE_OTHER): Payer: Medicaid Other | Admitting: Orthopaedic Surgery

## 2019-04-18 DIAGNOSIS — M25561 Pain in right knee: Secondary | ICD-10-CM

## 2019-04-18 DIAGNOSIS — M7062 Trochanteric bursitis, left hip: Secondary | ICD-10-CM | POA: Diagnosis not present

## 2019-04-18 MED ORDER — METHYLPREDNISOLONE ACETATE 40 MG/ML IJ SUSP
40.0000 mg | INTRAMUSCULAR | Status: AC | PRN
  Administered 2019-04-18: 40 mg via INTRA_ARTICULAR

## 2019-04-18 MED ORDER — LIDOCAINE HCL 1 % IJ SOLN
3.0000 mL | INTRAMUSCULAR | Status: AC | PRN
  Administered 2019-04-18: 3 mL

## 2019-04-18 NOTE — Progress Notes (Signed)
Office Visit Note   Patient: Kelsey Rodriguez           Date of Birth: January 12, 1991           MRN: [id number] Visit Date: 04/18/2019              Requested by: Everrett Coombe, MD [address] [place], [place] [address] PCP: Everrett Coombe, MD   Assessment & Plan: Visit Diagnoses:  1. Trochanteric bursitis of left hip   2. Acute pain of right knee     Plan: She will work on quad strengthening and knee friendly exercises as discussed.  Also work on IT band stretching.  She will follow-up with [place] on as-needed basis pain persist or returns.  Questions encouraged and answered at length.  Follow-Up Instructions: Return if symptoms worsen or fail to improve.   Orders:  No orders of the defined types were placed in this encounter.  No orders of the defined types were placed in this encounter.     Procedures: Large Joint Inj: R greater trochanter on 04/18/2019 9:13 AM Indications: pain Details: 22 G 1.5 in needle, lateral approach  Arthrogram: No  Medications: 3 mL lidocaine 1 %; 40 mg methylPREDNISolone acetate 40 MG/ML Outcome: tolerated well, no immediate complications Procedure, treatment alternatives, risks and benefits explained, specific risks discussed. Consent was given by the patient. Immediately prior to procedure a time out was called to verify the correct patient, procedure, equipment, support staff and site/side marked as required. Patient was prepped and draped in the usual sterile fashion.       Clinical Data: No additional findings.   Subjective: Chief Complaint  Patient presents with  . Right Knee - Follow-up    HPI [place] returns today for follow-up of her right knee pain.  She status post injection right knee on 01/17/2019 and states that the injection definitely helped.  She is now complaining of pain lateral aspect of her right hip.  No radicular symptoms.  She is status post bilateral hip replacement due to avascular necrosis.  States she is unable to  lie on the right hip due to pain.  Denies any mechanical symptoms of the right knee.  Review of Systems No fevers chills shortness breath chest pain  Objective: Vital Signs: There were no vitals taken for this visit.  Physical Exam General: Well-developed well-nourished female no acute distress mood affect appropriate.  Ambulates without any assistive device. Ortho Exam Right knee good range of motion without pain.  Slight patellofemoral crepitus. Right hip good range of motion without pain.  Tenderness of the right trochanteric region.  Right surgical incisions well-healed. Specialty Comments:  No specialty comments available.  Imaging: No results found.   PMFS History: Patient Active Problem List   Diagnosis Date Noted  . Tendinopathy of left shoulder 01/18/2019  . Chronic pelvic pain in female 01/04/2019  . Lower abdominal pain 06/21/2018  . Sore throat 05/11/2018  . Encounter for fertility planning 03/30/2018  . Syphilis 02/26/2018  . PID (acute pelvic inflammatory disease) 02/26/2018  . Meningitis 02/18/2018  . Tuberculosis   . Infertility, female 02/12/2018  . Lumbar back pain 10/21/2017  . 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 Presence of right artificial hip joint 08/24/2017  . Acute right-sided low back pain with right-sided sciatica 08/24/2017  . Complex regional pain syndrome 02/03/2017  . Irregular menstrual cycle 02/03/2017  . Headache 10/28/2016  . Avascular necrosis of bone of right hip ([place]) 04/04/2016  .  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 Avascular necrosis of bone of left hip ([place]) 12/14/2015  . 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 Overweight (BMI 25.0-29.9) 04/20/2015  . Sciatica of left side 02/12/2015  . Primary adrenal insufficiency ([place]) 01/03/2015  . HIV (human immunodeficiency virus infection) ([place]) 03/16/2012  . 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 Iron deficiency  anemia 03/11/2012  . Reflux esophagitis 03/11/2012   Past Medical History:  Diagnosis Date  . 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 Adrenal insufficiency ([place])   . Anemia of chronic disease 03/11/2012  . Back pain of lumbar region with sciatica 02/12/2015  . Bell's palsy 08/26/2013  . 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 Chronic back pain   . Chronic leg pain    bilateral knees, ankles  . Fatigue   . 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 HIV (human immunodeficiency virus infection) ([place]) 02/2012  . Laceration of ankle, right 11/18/2012  . Lumbar radiculopathy   . Pelvic pain   . Reflux esophagitis 03/11/2012  . Tuberculosis   . 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 Vertigo     Family History  Problem Relation Age of Onset  . Heart disease Father        Vague not clearly cardiac    Past Surgical History:  Procedure Laterality Date  . APPENDECTOMY  ~ 2000  . DILATION AND CURETTAGE OF UTERUS  2008  . ESOPHAGOGASTRODUODENOSCOPY  03/11/2012   Procedure: ESOPHAGOGASTRODUODENOSCOPY (EGD);  Surgeon: Lafayette Dragon, MD;  Location[facility] ENDOSCOPY;  Service: Endoscopy;  Laterality: N/A;  . ESOPHAGOGASTRODUODENOSCOPY N/A 03/07/2014   Procedure: ESOPHAGOGASTRODUODENOSCOPY (EGD);  Surgeon: Gatha Mayer, MD;  [facility] ENDOSCOPY;  Service: Endoscopy;  Laterality: N/A;  . LUNG BIOPSY  02/2012  . TOTAL HIP ARTHROPLASTY Left 12/14/2015   Procedure: LEFT TOTAL HIP ARTHROPLASTY ANTERIOR APPROACH;  Surgeon: Mcarthur Rossetti, MD;  Location: [facility] ORS;  Service: Orthopedics;  Laterality: Left;  . TOTAL HIP ARTHROPLASTY Right 04/04/2016   Procedure: RIGHT TOTAL HIP ARTHROPLASTY ANTERIOR APPROACH;  Surgeon: Mcarthur Rossetti, MD;  Location: [facility] ORS;  Service: Orthopedics;  Laterality: Right;   Social History   Occupational History  . Not on file  Tobacco Use  . Smoking status: Never Smoker  . Smokeless tobacco: Never Used  Substance and Sexual Activity  . Alcohol use: No     Alcohol/week: 0.0 standard drinks    Comment: socially  . Drug use: No  . Sexual activity: Not Currently    Partners: Male    Birth control/protection: None    Comment: pt. given condoms

## 2019-04-28 ENCOUNTER — Other Ambulatory Visit: Payer: Medicaid Other

## 2019-04-28 ENCOUNTER — Other Ambulatory Visit: Payer: Self-pay | Admitting: *Deleted

## 2019-04-28 ENCOUNTER — Other Ambulatory Visit: Payer: Self-pay

## 2019-04-28 DIAGNOSIS — B20 Human immunodeficiency virus [HIV] disease: Secondary | ICD-10-CM

## 2019-04-29 ENCOUNTER — Encounter: Payer: Self-pay | Admitting: Family Medicine

## 2019-04-29 ENCOUNTER — Ambulatory Visit (INDEPENDENT_AMBULATORY_CARE_PROVIDER_SITE_OTHER): Payer: Medicaid Other | Admitting: Family Medicine

## 2019-04-29 ENCOUNTER — Other Ambulatory Visit: Payer: Self-pay

## 2019-04-29 VITALS — BP 124/80 | HR 98

## 2019-04-29 DIAGNOSIS — N939 Abnormal uterine and vaginal bleeding, unspecified: Secondary | ICD-10-CM

## 2019-04-29 DIAGNOSIS — B20 Human immunodeficiency virus [HIV] disease: Secondary | ICD-10-CM | POA: Diagnosis not present

## 2019-04-29 LAB — T-HELPER CELL (CD4) - (RCID CLINIC ONLY)
CD4 % Helper T Cell: 6 % — ABNORMAL LOW (ref 33–65)
CD4 T Cell Abs: 70 /uL — ABNORMAL LOW (ref 400–1790)

## 2019-04-29 MED ORDER — SULFAMETHOXAZOLE-TRIMETHOPRIM 800-160 MG PO TABS: 1.0000 | ORAL_TABLET | Freq: Every day | ORAL | 0 refills | Status: DC

## 2019-04-29 NOTE — Patient Instructions (Signed)
Dear Kelsey Rodriguez,   It was very nice to see you! Thank you for taking your time to come in to be seen. Today, we discussed the following:   Increased bleeding  Your bleeding symptoms are normal with your recent restarting of birth control.  It may take up to 3 months for your bleeding to get back to a normal cycle.  Please continue to take the prescriptions that [facility] gynecology provided.  If you feel dizzy, weak, lightheaded, please call the doctor to get labs done.  Your labs from yesterday show that your blood levels are good.  HIV  Your CD4 count is low   I am sending you a medication called Bactrim, this will protect you from infections that you can easily get because your CD4 count is low.  Please take this pill once daily.    I also strongly encourage you to go back onto your antiretroviral therapy as soon as possible.  Please practice hand hygiene and avoid anybody who is actively ill or sick   Be well,   Dr. Zettie Cooley [facility] [phone]   Sign up for MyChart for instant access to your health profile, labs, orders, upcoming appointments or to contact your provider with questions.

## 2019-05-01 ENCOUNTER — Encounter: Payer: Self-pay | Admitting: Family Medicine

## 2019-05-01 NOTE — Assessment & Plan Note (Addendum)
Abnormal uterine bleeding is not concerning at this time.  Patient's cycle could take up to 3 months to become regular with new contraception.  Additionally, patient reports only mild spotting for the last several weeks regular flow for the past 3 to 4 days.  She denies any dizziness, soaking pads, shortness of breath, increased heart rate.   Reassured patient  Continue to monitor for heavy bleeding  Follow-up with gynecologist as previously scheduled on 05/23/19  Download app to start tracking daily symptoms and bleeding

## 2019-05-01 NOTE — Assessment & Plan Note (Signed)
Patient reports that she has not been taking her medication for the last month due to unwanted side effects.  Patient said that she has had improvement in side effect without taking the medication and was waiting to see her ID doctor on June 1 to try to switch the medication.  Patient went for labs yesterday and CD4 count was 70 down from 300 in July 2019.  Patient highly encouraged to start taking therapy again and discouraged from coming off therapy in the future.    Sent message to [employer] for FYI   Rx for ppx bactrim due to low CD4   Patient aware that she is at high risk for infection and encouraged to isolate and socially distance

## 2019-05-01 NOTE — Progress Notes (Signed)
Established Patient - Clinic Visit  Subjective  Subjective  Patient ID: MRN [id number]  Date of birth: Sep 02, 1991  PCP: Everrett Coombe, MD  CC: bleeding with depo  HPI:  Kelsey Rodriguez is a 28 y.o. 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  Prior to that, patient had not been on any contraceptive for 1 year.  She was on Depo-Provera for just over a year prior to that.  She reported regular cycles with very light flow.  During the time that she was not on contraceptives, patient had regular periods with 3 to 4 days of bleeding. She is coming in today because she has had continued on and off spotting since the weeks after receiving Depo-Provera shot.  She has follow-up with her gynecologist on June 8 but did not want to wait that long.  Patient was also recently started on Aygestin 5 mg daily.  She reports no improvement despite this medication.  #Noncompliance with ART Patient reports that she has not been taking her antiretrovirals for the last month as they cause her too many side effects such as headache, stomach upset.  She went yesterday to a lab visit at infectious disease and her CD4 count was 70.  She denies any fevers, chills, cough, diarrhea, nausea, vomiting, recent illness, sick contacts.  ROS: See HPI  HISTORY Meds  Allergies  PMHx: Reviewed as appropriate  Social Hx:  Mayline reports that she has never smoked. She has never used smokeless tobacco. She reports that she does not drink alcohol or use drugs.   Objective  Objective  Physical Exam:  BP 124/80   Pulse 98   SpO2 99%  General: NAD, non-toxic, well-appearing, sitting comfortably in chair HEENT: NCAT, no conjuctival pallor. EOMI. PERRLA Cardiovascular: RRR, normal S1, S2. 2+ RP bilaterally.  No BLEE Respiratory: CTAB. No  IWOB.  Abdomen: + BS. NT, ND, soft to palpation.  Extremities: Warm and well perfused. Moving spontaneously.  Neuro: CN grossly intact.  No FND.   Pertinent Labs & Imaging:  Reviewed in chart as appropriate CD4: 70, CMP, CBC within normal limits   Assessment & Plan  Abnormal uterine bleeding Abnormal uterine bleeding is not concerning at this time.  Patient's cycle could take up to 3 months to become regular with new contraception.  Additionally, patient reports only mild spotting for the last several weeks regular flow for the past 3 to 4 days.  She denies any dizziness, soaking pads, shortness of breath, increased heart rate.   Reassured patient  Continue to monitor for heavy bleeding  Follow-up with gynecologist as previously scheduled on 05/23/19  Download app to start tracking daily symptoms and bleeding  HIV (human immunodeficiency virus infection) ([place]) Patient reports that she has not been taking her medication for the last month due to unwanted side effects.  Patient said that she has had improvement in side effect without taking the medication and was waiting to see her ID doctor on June 1 to try to switch the medication.  Patient went for labs yesterday and CD4 count was 70 down from 300 in July 2019.  Patient highly encouraged to start taking therapy again and discouraged from coming off therapy in the future.    Sent message to [facility] for FYI   Rx for ppx bactrim due to low CD4   Patient  aware that she is at high risk for infection and encouraged to isolate and socially distance   Follow-up:  Future Appointments  Date Time Provider [place]  05/16/2019  2:00 PM Carlyle Basques, MD [facility]    Zettie Cooley, M.D. [place]  PGY -1 05/01/2019, 2:17 PM

## 2019-05-04 LAB — CBC WITH DIFFERENTIAL/PLATELET
Absolute Monocytes: 391 cells/uL (ref 200–950)
Basophils Absolute: 11 cells/uL (ref 0–200)
Basophils Relative: 0.3 %
Eosinophils Absolute: 141 cells/uL (ref 15–500)
Eosinophils Relative: 3.7 %
HCT: 35.4 % (ref 35.0–45.0)
Hemoglobin: 11.7 g/dL (ref 11.7–15.5)
Lymphs Abs: 1417 cells/uL (ref 850–3900)
MCH: 29.6 pg (ref 27.0–33.0)
MCHC: 33.1 g/dL (ref 32.0–36.0)
MCV: 89.6 fL (ref 80.0–100.0)
MPV: 12.4 fL (ref 7.5–12.5)
Monocytes Relative: 10.3 %
Neutro Abs: 1839 cells/uL (ref 1500–7800)
Neutrophils Relative %: 48.4 %
Platelets: 164 10*3/uL (ref 140–400)
RBC: 3.95 10*6/uL (ref 3.80–5.10)
RDW: 17 % — ABNORMAL HIGH (ref 11.0–15.0)
Total Lymphocyte: 37.3 %
WBC: 3.8 10*3/uL (ref 3.8–10.8)

## 2019-05-04 LAB — COMPLETE METABOLIC PANEL WITH GFR
AG Ratio: 1.1 (calc) (ref 1.0–2.5)
ALT: 11 U/L (ref 6–29)
AST: 14 U/L (ref 10–30)
Albumin: 4.3 g/dL (ref 3.6–5.1)
Alkaline phosphatase (APISO): 76 U/L (ref 31–125)
BUN: 9 mg/dL (ref 7–25)
CO2: 25 mmol/L (ref 20–32)
Calcium: 9.2 mg/dL (ref 8.6–10.2)
Chloride: 105 mmol/L (ref 98–110)
Creat: 0.81 mg/dL (ref 0.50–1.10)
GFR, Est African American: 113 mL/min/{1.73_m2} (ref >=60)
GFR, Est Non African American: 97 mL/min/{1.73_m2} (ref >=60)
Globulin: 3.8 g/dL (calc) — ABNORMAL HIGH (ref 1.9–3.7)
Glucose, Bld: 101 mg/dL — ABNORMAL HIGH (ref 65–99)
Potassium: 4.5 mmol/L (ref 3.5–5.3)
Sodium: 136 mmol/L (ref 135–146)
Total Bilirubin: 0.4 mg/dL (ref 0.2–1.2)
Total Protein: 8.1 g/dL (ref 6.1–8.1)

## 2019-05-04 LAB — FLUORESCENT TREPONEMAL AB(FTA)-IGG-BLD: Fluorescent Treponemal ABS: NONREACTIVE

## 2019-05-04 LAB — RPR TITER: RPR Titer: 1:2 {titer} — ABNORMAL HIGH

## 2019-05-04 LAB — HIV-1 RNA QUANT-NO REFLEX-BLD
HIV 1 RNA Quant: 82100 copies/mL — ABNORMAL HIGH
HIV-1 RNA Quant, Log: 4.91 Log copies/mL — ABNORMAL HIGH

## 2019-05-04 LAB — RPR: RPR Ser Ql: REACTIVE — AB

## 2019-05-12 ENCOUNTER — Telehealth: Payer: Self-pay | Admitting: Internal Medicine

## 2019-05-12 NOTE — Telephone Encounter (Signed)
[id number] Pre-Screening Questions: 05/12/19  Do you currently have a fever (>100 F), chills or unexplained body aches? NO  Are you currently experiencing new cough, shortness of breath, sore throat, runny nose? NO    Have you recently travelled outside the state of [place] in the last 14 days? NO   Have you been in contact with someone that is currently pending confirmation of Covid19 testing or has been confirmed to have the [place] virus? NO **If the patient answers NO to ALL questions -  advise the patient to please call the clinic before coming to the office should any symptoms develop.

## 2019-05-16 ENCOUNTER — Encounter: Payer: Self-pay | Admitting: Internal Medicine

## 2019-05-16 ENCOUNTER — Ambulatory Visit: Payer: Medicaid Other | Admitting: Internal Medicine

## 2019-05-16 ENCOUNTER — Other Ambulatory Visit: Payer: Self-pay

## 2019-05-16 VITALS — BP 129/87 | HR 93 | Temp 99.1°F | Wt 181.0 lb

## 2019-05-16 DIAGNOSIS — Z91199 Patient's noncompliance with other medical treatment and regimen due to unspecified reason: Secondary | ICD-10-CM

## 2019-05-16 DIAGNOSIS — Z9119 Patient's noncompliance with other medical treatment and regimen: Secondary | ICD-10-CM | POA: Diagnosis not present

## 2019-05-16 DIAGNOSIS — G43C0 Periodic headache syndromes in child or adult, not intractable: Secondary | ICD-10-CM

## 2019-05-16 DIAGNOSIS — M5440 Lumbago with sciatica, unspecified side: Secondary | ICD-10-CM

## 2019-05-16 DIAGNOSIS — B20 Human immunodeficiency virus [HIV] disease: Secondary | ICD-10-CM | POA: Diagnosis not present

## 2019-05-16 MED ORDER — SULFAMETHOXAZOLE-TRIMETHOPRIM 400-80 MG PO TABS: 1.0000 | ORAL_TABLET | Freq: Every day | ORAL | 6 refills | Status: DC

## 2019-05-16 NOTE — Progress Notes (Signed)
Patient ID: Kelsey Rodriguez, female   DOB: [birth year], 28 y.o.   MRN: [id number]  HPI Kelsey Rodriguez is a 28yo F with hiv disease, poor compliance. Just restarted taking her medications Has headaches with associated with medication  Takes 3 days/7 days a week.  Has had history of intermittent adherence in the past few years  Outpatient Encounter Medications as of 05/16/2019  Medication Sig  . acetaminophen-codeine (TYLENOL #3) 300-30 MG tablet Take 1-2 tablets by mouth every 8 (eight) hours as needed for moderate pain.  . Darunavir-Cobicisctat-Emtricitabine-Tenofovir Alafenamide (SYMTUZA) 800-150-200-10 MG TABS Take 1 tablet by mouth daily with breakfast.  . Darunavir-Cobicisctat-Emtricitabine-Tenofovir Alafenamide (SYMTUZA) 800-150-200-10 MG TABS   . gabapentin (NEURONTIN) 100 MG capsule Take 1 capsule (100 mg total) by mouth 3 (three) times daily.  . meloxicam (MOBIC) 15 MG tablet TAKE 1 TABLET BY MOUTH DAILY FOR 14 DAYS  . norethindrone (AYGESTIN) 5 MG tablet Take 5 mg by mouth daily.  [name] omeprazole (PRILOSEC) 20 MG capsule Take 1 capsule (20 mg total) by mouth 2 (two) times daily before a meal.  . omeprazole (PRILOSEC) 20 MG capsule Take by mouth.  . Polyethylene Glycol 3350 (PEG 3350) POWD Take by mouth.  . polyethylene glycol powder (GLYCOLAX/MIRALAX) powder Take 17 g by mouth 2 (two) times daily as needed for mild constipation.  . predniSONE (STERAPRED UNI-PAK 21 TAB) 10 MG (21) TBPK tablet Take by mouth daily. Take as directed.  . senna-docusate (SENOKOT-S) 8.6-50 MG tablet Take 1 tablet by mouth 2 (two) times daily.  [name] senna-docusate (SENOKOT-S) 8.6-50 MG tablet Take by mouth.  . sulfamethoxazole-trimethoprim (BACTRIM DS) 800-160 MG tablet Take 1 tablet by mouth daily for 18 days.  . SUMAtriptan (IMITREX) 50 MG tablet Take 1 tablet (50 mg total) by mouth every 2 (two) hours as needed for migraine. May repeat in 2 hours if headache persists or recurs.  . SUMAtriptan (IMITREX) 50 MG  tablet Take by mouth.  . SYMTUZA 800-150-200-10 MG TABS TAKE 1 TABLET BY MOUTH DAILY WITH BREAKFAST.   No facility-administered encounter medications on file as of 05/16/2019.      Patient Active Problem List   Diagnosis Date Noted  . Tendinopathy of left shoulder 01/18/2019  . Chronic pelvic pain in female 01/04/2019  . Lower abdominal pain 06/21/2018  . Sore throat 05/11/2018  . Encounter for fertility planning 03/30/2018  . Syphilis 02/26/2018  . PID (acute pelvic inflammatory disease) 02/26/2018  . Meningitis 02/18/2018  . Tuberculosis   . Infertility, female 02/12/2018  . Lumbar back pain 10/21/2017  . 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 Presence of right artificial hip joint 08/24/2017  . Acute right-sided low back pain with right-sided sciatica 08/24/2017  . Complex regional pain syndrome 02/03/2017  . Irregular menstrual cycle 02/03/2017  . Headache 10/28/2016  . Abnormal uterine bleeding 08/07/2016  . Avascular necrosis of bone of right hip ([place]) 04/04/2016  . 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 Avascular necrosis of bone of left hip ([place]) 12/14/2015  . 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 Overweight (BMI 25.0-29.9) 04/20/2015  . Sciatica of left side 02/12/2015  . Primary adrenal insufficiency ([place]) 01/03/2015  . HIV (human immunodeficiency virus infection) ([place]) 03/16/2012  . 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 Iron deficiency anemia 03/11/2012  . Reflux esophagitis 03/11/2012     There are no preventive care reminders to display for this patient.   Review of  Systems Review of Systems  Constitutional: Negative for fever, chills, diaphoresis, activity change, appetite change, fatigue and unexpected weight change.  HENT: Negative for congestion, sore throat, rhinorrhea, sneezing, trouble swallowing and sinus pressure.  Eyes: Negative for photophobia and visual disturbance.   Respiratory: Negative for cough, chest tightness, shortness of breath, wheezing and stridor.  Cardiovascular: Negative for chest pain, palpitations and leg swelling.  Gastrointestinal: Negative for nausea, vomiting, abdominal pain, diarrhea, constipation, blood in stool, abdominal distention and anal bleeding.  Genitourinary: Negative for dysuria, hematuria, flank pain and difficulty urinating.  Musculoskeletal: Negative for myalgias, back pain, joint swelling, arthralgias and gait problem.  Skin: Negative for color change, pallor, rash and wound.  Neurological: Negative for dizziness, tremors, weakness and light-headedness.  Hematological: Negative for adenopathy. Does not bruise/bleed easily.  Psychiatric/Behavioral: Negative for behavioral problems, confusion, sleep disturbance, dysphoric mood, decreased concentration and agitation.    Physical Exam   BP 129/87   Pulse 93   Temp 99.1 F (37.3 C) (Oral)   Wt 181 lb (82.1 kg)   BMI 30.12 kg/m   Physical Exam  Constitutional:  oriented to person, place, and time. appears well-developed and well-nourished. No distress.  HENT: [place]/AT, PERRLA, no scleral icterus Mouth/Throat: Oropharynx is clear and moist. No oropharyngeal exudate.  Cardiovascular: Normal rate, regular rhythm and normal heart sounds. Exam reveals no gallop and no friction rub.  No murmur heard.  Pulmonary/Chest: Effort normal and breath sounds normal. No respiratory distress.  has no wheezes.  Neck = supple, no nuchal rigidity Abdominal: Soft. Bowel sounds are normal.  exhibits no distension. There is no tenderness.  Lymphadenopathy: no cervical adenopathy. No axillary adenopathy Neurological: alert and oriented to person, place, and time.  Skin: Skin is warm and dry. No rash noted. No erythema.  Psychiatric: a normal mood and affect.  behavior is normal.   Lab Results  Component Value Date   CD4TCELL 6 (L) 04/28/2019   Lab Results  Component Value Date   CD4TABS  70 (L) 04/28/2019   CD4TABS 300 (L) 07/13/2018   CD4TABS 210 (L) 02/18/2018   Lab Results  Component Value Date   HIV1RNAQUANT 82,100 (H) 04/28/2019   Lab Results  Component Value Date   HEPBSAB POS (A) 09/13/2015   Lab Results  Component Value Date   LABRPR REACTIVE (A) 04/28/2019    CBC Lab Results  Component Value Date   WBC 3.8 04/28/2019   RBC 3.95 04/28/2019   HGB 11.7 04/28/2019   HCT 35.4 04/28/2019   PLT 164 04/28/2019   MCV 89.6 04/28/2019   MCH 29.6 04/28/2019   MCHC 33.1 04/28/2019   RDW 17.0 (H) 04/28/2019   LYMPHSABS 1,417 04/28/2019   MONOABS 630 12/25/2016   EOSABS 141 04/28/2019    BMET Lab Results  Component Value Date   NA 136 04/28/2019   K 4.5 04/28/2019   CL 105 04/28/2019   CO2 25 04/28/2019   GLUCOSE 101 (H) 04/28/2019   BUN 9 04/28/2019   CREATININE 0.81 04/28/2019   CALCIUM 9.2 04/28/2019   GFRNONAA 97 04/28/2019   GFRAA 113 04/28/2019      Assessment and Plan   hiv disease = will start to take symtuza at bedtime. Will check genotype  Prophylaxis= will start with bactrim ss daily  Headache = not every day. No nuchal rigidity. No visual discomfort - will check cr Ag. Treat prn tylenol  Lower abdominal discomfort= getting worked up by gynecology.  Meet up with cassie to do adherence  counseling in 2-3 wks

## 2019-05-17 ENCOUNTER — Telehealth: Payer: Self-pay | Admitting: Pharmacy Technician

## 2019-05-17 NOTE — Telephone Encounter (Signed)
[facility] Patient Advocate Encounter  Spoke with the patient today about refilling her medication. Septra was mailed from [facility] yesterday and Jules Husbands was mailed out today with expected arrival of 06/03 or 06/04. Patient stated she stopped taking her medication because of headaches and had multiple bottles with varying amounts of tablets remaining. She combined all of those into one bottle and has about 15 tablets remaining. She stated that she was feeling more positive about taking the medication per Dr. Storm Frisk recommendation to take the medication at night. We sent out a new refill today and timed the next refill for beginning of July. She stated she will call pharmacy or [place] team if she experiences any additional problems.   Bartholomew Crews, CPhT Specialty Pharmacy Patient [facility] for Infectious Disease Phone: [phone] Fax: [phone] 05/17/2019 11:57 AM

## 2019-05-23 DIAGNOSIS — N9489 Other specified conditions associated with female genital organs and menstrual cycle: Secondary | ICD-10-CM | POA: Diagnosis not present

## 2019-05-23 DIAGNOSIS — N979 Female infertility, unspecified: Secondary | ICD-10-CM | POA: Diagnosis not present

## 2019-05-23 DIAGNOSIS — B20 Human immunodeficiency virus [HIV] disease: Secondary | ICD-10-CM | POA: Diagnosis not present

## 2019-05-23 DIAGNOSIS — N944 Primary dysmenorrhea: Secondary | ICD-10-CM | POA: Diagnosis not present

## 2019-05-23 DIAGNOSIS — N7011 Chronic salpingitis: Secondary | ICD-10-CM | POA: Diagnosis not present

## 2019-06-02 DIAGNOSIS — N9489 Other specified conditions associated with female genital organs and menstrual cycle: Secondary | ICD-10-CM | POA: Diagnosis not present

## 2019-06-05 LAB — CRYPTOCOCCAL AG, LTX SCR RFLX TITER
Cryptococcal Ag Screen: NOT DETECTED
MICRO NUMBER:: 528099
SPECIMEN QUALITY:: ADEQUATE

## 2019-06-05 LAB — HIV-1 INTEGRASE GENOTYPE

## 2019-06-05 LAB — HIV-1 GENOTYPE: HIV-1 Genotype: DETECTED — AB

## 2019-06-05 LAB — HIV RNA, RTPCR W/R GT (RTI, PI,INT)
HIV 1 RNA Quant: 120000 copies/mL — ABNORMAL HIGH
HIV-1 RNA Quant, Log: 5.08 Log copies/mL — ABNORMAL HIGH

## 2019-06-27 ENCOUNTER — Ambulatory Visit: Payer: Medicaid Other | Admitting: Internal Medicine

## 2019-07-04 ENCOUNTER — Ambulatory Visit: Payer: Medicaid Other | Admitting: Internal Medicine

## 2019-07-18 ENCOUNTER — Telehealth: Payer: Self-pay

## 2019-07-18 ENCOUNTER — Other Ambulatory Visit: Payer: Self-pay | Admitting: Pharmacist

## 2019-07-18 DIAGNOSIS — B20 Human immunodeficiency virus [HIV] disease: Secondary | ICD-10-CM

## 2019-07-18 NOTE — Telephone Encounter (Signed)
Called patient to schedule overdue appointment with our office. Patient states that she was unable to come to her follow-up appointment due to her nephew having appendicitis. Patient states that her nephew also tested positive for COVID 19 on 7/26. Her family is under self quarantine per local health department.   Nephew is quarantined and separated from other members of the family to avoid infecting others. Patient will try to go to [place] with her children to get tested.  Patient understands that she will need to call office before her appointment if she does test positive for covid to avoid infecting others in the office. [place]

## 2019-07-21 ENCOUNTER — Other Ambulatory Visit: Payer: Self-pay

## 2019-07-21 DIAGNOSIS — Z20822 Contact with and (suspected) exposure to covid-19: Secondary | ICD-10-CM

## 2019-07-22 LAB — NOVEL CORONAVIRUS, NAA: SARS-CoV-2, NAA: NOT DETECTED

## 2019-07-22 LAB — SPECIMEN STATUS REPORT

## 2019-07-25 ENCOUNTER — Other Ambulatory Visit: Payer: Self-pay | Admitting: *Deleted

## 2019-07-25 DIAGNOSIS — B20 Human immunodeficiency virus [HIV] disease: Secondary | ICD-10-CM

## 2019-08-02 ENCOUNTER — Other Ambulatory Visit: Payer: Medicaid Other

## 2019-08-11 ENCOUNTER — Telehealth: Payer: Self-pay | Admitting: Pharmacist

## 2019-08-11 NOTE — Telephone Encounter (Signed)
I have tried to call patient 2-3 times over the past several weeks to check up on her adherence.  No answer and no return of voice mails.  She sees Dr. Baxter Flattery on 8/31.

## 2019-08-15 ENCOUNTER — Ambulatory Visit: Payer: Medicaid Other | Admitting: Internal Medicine

## 2019-08-29 ENCOUNTER — Ambulatory Visit: Payer: Medicaid Other | Admitting: Family Medicine

## 2019-08-29 ENCOUNTER — Encounter: Payer: Self-pay | Admitting: Family Medicine

## 2019-08-29 ENCOUNTER — Other Ambulatory Visit: Payer: Self-pay

## 2019-08-29 DIAGNOSIS — Z23 Encounter for immunization: Secondary | ICD-10-CM

## 2019-08-29 DIAGNOSIS — B353 Tinea pedis: Secondary | ICD-10-CM

## 2019-08-29 DIAGNOSIS — L299 Pruritus, unspecified: Secondary | ICD-10-CM | POA: Insufficient documentation

## 2019-08-29 MED ORDER — TERBINAFINE HCL 1 % EX CREA: 1.0000 "application " | TOPICAL_CREAM | Freq: Two times a day (BID) | CUTANEOUS | 0 refills | Status: DC

## 2019-08-29 NOTE — Assessment & Plan Note (Addendum)
Patient reports 2 months of itching in both feet, has some dry skin on both feet and bilateral great toe fungus  -KOH  -prescribe Terbinafine : Apply between the toes to affected area twice daily for at least 1 week; apply on the bottom or sides of feet twice daily for 2 weeks

## 2019-08-29 NOTE — Progress Notes (Signed)
  Patient Name: Kelsey Rodriguez Date of Birth: Feb 12, 1991 Date of Visit: 08/29/19 PCP: Gerlene Fee, DO  Chief Complaint: itchy feet   Subjective: Kelsey Rodriguez is a 28 y.o. with medical history significant for HIV and adrenal insufficiency  presenting today for pruritus in both feet.   Kelsey Rodriguez states feet began to itch 2 months ago, keeps her awake at night, tried over the counter creams and  foot powder that did not alleviate the itching.  The itching is primarily on the edges of her feet. Patient reports that after scratching for prolonged periods her feet began to hurt and burn.  She reports that her feet sometimes feel like ant bites.  She denies any alleviating factors other than scratching.  Patient also endorses some dark spots on the affected areas of her feet.  She also states that she has occasional numbness and paresthesias in her feet but that this is not a regular symptom.  Patient was prescribed gabapentin 10mg  3 times daily previously but has not been taking this medication lately.  Patient also states that she does not like taking pills as it makes her nauseous but she has not vomited after taking pills in the last 3 months.  Patient denies any itching or paresthesias or burning in her hands. Patient states that she last spoke to the pharmacist who said that she may have itching associated with diabetes.  Patient is interested in being tested for diabetes.  Patient is also interested in having her flu vaccine today.  I have reviewed the patient's medical, surgical, family, and social history as appropriate.  Vitals:   08/29/19 1009  BP: 112/64  Pulse: 90  SpO2: 99%    Physical Exam:   General: Alert and cooperative and appears to be in no acute distress Cardio: Normal S1 and S2, no S3 or S4. Rhythm is regular. No murmurs or rubs.   Pulm: Clear to auscultation bilaterally, no crackles, wheezing, or diminished breath sounds. Normal respiratory effort  Abdomen: Bowel sounds normal. Abdomen soft and non-tender.  Extremities:  peripheral edema present in bilateral feet. Warm/ well perfused.  Strong radial and pedal pulses.  1 mm hyperpigmented macules present on lateral and medial edges of dorsum of feet, some mild excoriations Neuro: alert and oriented   Media Information    Document Information  Photos    08/29/2019 10:40  Attached To:  Office Visit on 08/29/19 with Stark Klein, MD  Source Information  Stark Klein, MD  [facility] Resident   Assessment & Plan:   Tinea pedis of both feet Patient reports 2 months of itching in both feet, has some dry skin on both feet and bilateral great toe fungus  -KOH  -prescribe Terbinafine : Apply between the toes to affected area twice daily for at least 1 week; apply on the bottom or sides of feet twice daily for 2 weeks  Healthcare Maintenance:  Patient received flu vaccine today.    Return to care in 4 weeks.   Stark Klein, MD  Family Medicine  PGY1

## 2019-08-29 NOTE — Assessment & Plan Note (Deleted)
Patient reports 2 months of itching in bilateral feet. Itching is not relieved with topical lotions or powders.  Patient denies any new rashes but does report dark spots on the lateral and medial edges of her foot. -Prescribed gabapentin 100 mg 3 times daily -Check hemoglobin A1c

## 2019-08-29 NOTE — Patient Instructions (Addendum)
It was a pleasure to see you today! Thank you for choosing [facility] for your primary care. Kelsey Rodriguez was seen for itching in both feet that we believe is tinea pedis (fungal infection of the feet).   Our plans for today were:  Terbinafine cream to apply to both feet and between toes   Please apply this cream between your toes twice daily for 1 week  Please apply this cream to the outside of your feet for 2 weeks, two times daily.   To keep you healthy, we need to monitor some screening tests.   You are due for :  1. Flu vaccine   You should return to our clinic to see either Dr. Rosita Fire or Dr. Janus Molder in 4 weeks to follow up on your feet itching.   Best,  Dr. Rosita Fire  Athlete's Foot  Athlete's foot (tinea pedis) is a fungal infection of the skin on your feet. It often occurs on the skin that is between or underneath your toes. It can also occur on the soles of your feet. Symptoms include itchy or white and flaky areas on the skin. The infection can spread from person to person (is contagious). It can also spread when a person's bare feet come in contact with the fungus on shower floors or on items such as shoes. Follow these instructions at home: Medicines  Apply or take over-the-counter and prescription medicines only as told by your doctor.  Apply your antifungal medicine as told by your doctor. Do not stop using the medicine even if your feet start to get better. Foot care  Do not scratch your feet.  Keep your feet dry: ? Wear cotton or wool socks. Change your socks every day or if they become wet. ? Wear shoes that allow air to move around, such as sandals or canvas tennis shoes.  Wash and dry your feet: ? Every day or as told by your doctor. ? After exercising. ? Including the area between your toes. General instructions  Do not share any of these items that touch your feet: ? Towels. ? Shoes. ? Nail clippers. ? Other personal  items.  Protect your feet by wearing sandals in wet areas, such as locker rooms and shared showers.  Keep all follow-up visits as told by your doctor. This is important.  If you have diabetes, keep your blood sugar under control. Contact a doctor if:  You have a fever.  You have swelling, pain, warmth, or redness in your foot.  Your feet are not getting better with treatment.  Your symptoms get worse.  You have new symptoms. Summary  Athlete's foot is a fungal infection of the skin on your feet.  Symptoms include itchy or white and flaky areas on the skin.  Apply your antifungal medicine as told by your doctor.  Keep your feet clean and dry. This information is not intended to replace advice given to you by your health care provider. Make sure you discuss any questions you have with your health care provider. Document Released: 05/19/2008 Document Revised: 11/26/2017 Document Reviewed: 09/21/2017 Elsevier Patient Education  2020 [employer].

## 2019-09-12 ENCOUNTER — Ambulatory Visit (INDEPENDENT_AMBULATORY_CARE_PROVIDER_SITE_OTHER): Payer: Medicaid Other | Admitting: Orthopaedic Surgery

## 2019-09-12 ENCOUNTER — Encounter: Payer: Self-pay | Admitting: Orthopaedic Surgery

## 2019-09-12 ENCOUNTER — Ambulatory Visit: Payer: Self-pay

## 2019-09-12 DIAGNOSIS — M545 Low back pain, unspecified: Secondary | ICD-10-CM

## 2019-09-12 DIAGNOSIS — M25551 Pain in right hip: Secondary | ICD-10-CM | POA: Diagnosis not present

## 2019-09-12 MED ORDER — TIZANIDINE HCL 4 MG PO TABS: 4.0000 mg | ORAL_TABLET | Freq: Three times a day (TID) | ORAL | 0 refills | Status: DC | PRN

## 2019-09-12 MED ORDER — METHYLPREDNISOLONE 4 MG PO TABS: ORAL_TABLET | ORAL | 0 refills | Status: DC

## 2019-09-12 MED ORDER — ACETAMINOPHEN-CODEINE #3 300-30 MG PO TABS: 1.0000 | ORAL_TABLET | Freq: Three times a day (TID) | ORAL | 0 refills | Status: DC | PRN

## 2019-09-12 NOTE — Progress Notes (Signed)
Office Visit Note   Patient: Kelsey Rodriguez           Date of Birth: 07/30/1991           MRN: [id number] Visit Date: 09/12/2019              Requested by: Gerlene Fee, [address],  [place] [id number] PCP: Gerlene Fee, DO   Assessment & Plan: Visit Diagnoses:  1. Pain in right hip   2. Low back pain, unspecified back pain laterality, unspecified chronicity, unspecified whether sciatica present     Plan: Given her acute flareup of sciatica I would like to keep her out of work the next 2 weeks to see if this will calm things down.  She will work on back extension exercises.  I will try a steroid taper combined with a muscle relaxant, and anti-inflammatories and some pain medication.  However this combination things will help her symptoms resolved.  We will reevaluate her in 2 weeks to make a determination of what her neck steps will be.  Again I am not sending her to physical therapy right now because she has been through this before with her back and she does not know what to do for back extension exercises standpoint.  She will also avoid heavy lifting.  Follow-Up Instructions: Return in about 2 weeks (around 09/26/2019).   Orders:  Orders Placed This Encounter  Procedures  . XR HIP UNILAT W OR W/O PELVIS 1V RIGHT  . XR Lumbar Spine 2-3 Views   Meds ordered this encounter  Medications  . methylPREDNISolone (MEDROL) 4 MG tablet    Sig: Medrol dose pack. Take as instructed    Dispense:  21 tablet    Refill:  0  . tiZANidine (ZANAFLEX) 4 MG tablet    Sig: Take 1 tablet (4 mg total) by mouth every 8 (eight) hours as needed for muscle spasms.    Dispense:  30 tablet    Refill:  0  . acetaminophen-codeine (TYLENOL #3) 300-30 MG tablet    Sig: Take 1-2 tablets by mouth every 8 (eight) hours as needed for moderate pain.    Dispense:  40 tablet    Refill:  0      Procedures: No procedures performed   Clinical Data: No additional  findings.   Subjective: Chief Complaint  Patient presents with  . Right Hip - Pain  . Lower Back - Pain  The patient is well-known to [place].  She has a history of bilateral hip replacements done several years ago secondary to avascular necrosis.  We have seen her for her low back and sciatica before on the right side.  She actually had an MRI of her lumbar spine remotely that did show a herniated disc at L5-S1 to the right side.  An MRI we obtained last year showed the disc and resolved.  She is a 2-week history of low back pain and difficulty walking.  She does work as a Pharmacologist.  She states she cannot stand or sit for long periods of time and bending over is painful secondary to pain in her back and going down into her right hip and sciatic region.  She denies any change in bowel bladder function.  She has tried to work through the pain and has not missed work.  She denies any numbness and tingling in her feet  HPI  Review of Systems .  She currently denies any headache, chest pain,  shortness of breath, fever, chills, nausea, vomiting  Objective: Vital Signs: There were no vitals taken for this visit.  Physical Exam She is alert and orient x3 and in no acute distress Ortho Exam Examination of her bilateral lower extremity shows a positive straight leg raise on the right side.  She has limited flexion extension of her lumbar spine secondary to pain.  She has good strength in her bilateral extremities and normal sensation. Specialty Comments:  No specialty comments available.  Imaging: Xr Hip Unilat W Or W/o Pelvis 1v Right  Result Date: 09/12/2019 An AP pelvis and lateral of the right hip shows bilateral total hip arthroplasties with no complicating features.  There is no evidence of loosening.  Xr Lumbar Spine 2-3 Views  Result Date: 09/12/2019 An AP and lateral lumbar spine shows normal alignment with no acute findings.  The disc heights are  well-maintained.    PMFS History: Patient Active Problem List   Diagnosis Date Noted  . Pruritus 08/29/2019  . Tinea pedis of both feet 08/29/2019  . Tendinopathy of left shoulder 01/18/2019  . Chronic pelvic pain in female 01/04/2019  . Lower abdominal pain 06/21/2018  . Sore throat 05/11/2018  . Encounter for fertility planning 03/30/2018  . Syphilis 02/26/2018  . PID (acute pelvic inflammatory disease) 02/26/2018  . Meningitis 02/18/2018  . Tuberculosis   . Infertility, female 02/12/2018  . Lumbar back pain 10/21/2017  . 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 Presence of right artificial hip joint 08/24/2017  . Acute right-sided low back pain with right-sided sciatica 08/24/2017  . Complex regional pain syndrome 02/03/2017  . Irregular menstrual cycle 02/03/2017  . Headache 10/28/2016  . Abnormal uterine bleeding 08/07/2016  . Avascular necrosis of bone of right hip ([place]) 04/04/2016  . 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 Avascular necrosis of bone of left hip ([place]) 12/14/2015  . 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 Overweight (BMI 25.0-29.9) 04/20/2015  . Sciatica of left side 02/12/2015  . Primary adrenal insufficiency ([place]) 01/03/2015  . HIV (human immunodeficiency virus infection) ([place]) 03/16/2012  . 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 Iron deficiency anemia 03/11/2012  . Reflux esophagitis 03/11/2012   Past Medical History:  Diagnosis Date  . 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 Adrenal insufficiency ([place])   . Anemia of chronic disease 03/11/2012  . Back pain of lumbar region with sciatica 02/12/2015  . Bell's palsy 08/26/2013  . 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 Chronic back pain   . Chronic leg pain    bilateral knees, ankles  . Fatigue   . 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 HIV (human immunodeficiency virus infection) ([place]) 02/2012  . Laceration of ankle, right 11/18/2012  . Lumbar  radiculopathy   . Pelvic pain   . Reflux esophagitis 03/11/2012  . Tuberculosis   . 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 Vertigo     Family History  Problem Relation Age of Onset  . Heart disease Father        Vague not clearly cardiac    Past Surgical History:  Procedure Laterality Date  . APPENDECTOMY  ~ 2000  . DILATION AND CURETTAGE OF UTERUS  2008  . ESOPHAGOGASTRODUODENOSCOPY  03/11/2012   Procedure: ESOPHAGOGASTRODUODENOSCOPY (EGD);  Surgeon: Lafayette Dragon, MD;  Location: [facility] ENDOSCOPY;  Service: Endoscopy;  Laterality: N/A;  . ESOPHAGOGASTRODUODENOSCOPY N/A 03/07/2014   Procedure: ESOPHAGOGASTRODUODENOSCOPY (EGD);  Surgeon: Gatha Mayer,  MD;  Location: [facility] ENDOSCOPY;  Service: Endoscopy;  Laterality: N/A;  . LUNG BIOPSY  02/2012  . TOTAL HIP ARTHROPLASTY Left 12/14/2015   Procedure: LEFT TOTAL HIP ARTHROPLASTY ANTERIOR APPROACH;  Surgeon: Mcarthur Rossetti, MD;  Location: [facility]S;  Service: Orthopedics;  Laterality: Left;  . TOTAL HIP ARTHROPLASTY Right 04/04/2016   Procedure: RIGHT TOTAL HIP ARTHROPLASTY ANTERIOR APPROACH;  Surgeon: Mcarthur Rossetti, MD;  Location: [facility] ORS;  Service: Orthopedics;  Laterality: Right;   Social History   Occupational History  . Not on file  Tobacco Use  . Smoking status: Never Smoker  . Smokeless tobacco: Never Used  Substance and Sexual Activity  . Alcohol use: No    Alcohol/week: 0.0 standard drinks    Comment: socially  . Drug use: No  . Sexual activity: Not Currently    Partners: Male    Birth control/protection: None    Comment: pt. given condoms

## 2019-09-23 ENCOUNTER — Encounter (HOSPITAL_COMMUNITY): Payer: Self-pay | Admitting: Emergency Medicine

## 2019-09-23 ENCOUNTER — Emergency Department (HOSPITAL_COMMUNITY)
Admission: EM | Admit: 2019-09-23 | Discharge: 2019-09-23 | Disposition: A | Discharge status: Home / Self Care | Payer: Medicaid Other | Attending: Emergency Medicine | Admitting: Emergency Medicine

## 2019-09-23 ENCOUNTER — Other Ambulatory Visit: Payer: Self-pay

## 2019-09-23 DIAGNOSIS — R519 Headache, unspecified: Secondary | ICD-10-CM | POA: Diagnosis not present

## 2019-09-23 DIAGNOSIS — M542 Cervicalgia: Secondary | ICD-10-CM | POA: Diagnosis not present

## 2019-09-23 DIAGNOSIS — M546 Pain in thoracic spine: Secondary | ICD-10-CM | POA: Insufficient documentation

## 2019-09-23 DIAGNOSIS — Z79899 Other long term (current) drug therapy: Secondary | ICD-10-CM | POA: Insufficient documentation

## 2019-09-23 DIAGNOSIS — B20 Human immunodeficiency virus [HIV] disease: Secondary | ICD-10-CM | POA: Insufficient documentation

## 2019-09-23 DIAGNOSIS — G8929 Other chronic pain: Secondary | ICD-10-CM | POA: Insufficient documentation

## 2019-09-23 LAB — COMPREHENSIVE METABOLIC PANEL
ALT: 19 U/L (ref 0–44)
AST: 25 U/L (ref 15–41)
Albumin: 3.8 g/dL (ref 3.5–5.0)
Alkaline Phosphatase: 78 U/L (ref 38–126)
Anion gap: 13 (ref 5–15)
BUN: 7 mg/dL (ref 6–20)
CO2: 19 mmol/L — ABNORMAL LOW (ref 22–32)
Calcium: 9.3 mg/dL (ref 8.9–10.3)
Chloride: 104 mmol/L (ref 98–111)
Creatinine, Ser: 0.83 mg/dL (ref 0.44–1.00)
GFR calc Af Amer: >60 mL/min (ref >60)
GFR calc non Af Amer: >60 mL/min (ref >60)
Glucose, Bld: 151 mg/dL — ABNORMAL HIGH (ref 70–99)
Potassium: 3.8 mmol/L (ref 3.5–5.1)
Sodium: 136 mmol/L (ref 135–145)
Total Bilirubin: 0.6 mg/dL (ref 0.3–1.2)
Total Protein: 7.8 g/dL (ref 6.5–8.1)

## 2019-09-23 LAB — CBC WITH DIFFERENTIAL/PLATELET
Abs Immature Granulocytes: 0.02 10*3/uL (ref 0.00–0.07)
Basophils Absolute: 0 10*3/uL (ref 0.0–0.1)
Basophils Relative: 1 %
Eosinophils Absolute: 0.4 10*3/uL (ref 0.0–0.5)
Eosinophils Relative: 10 %
HCT: 39.5 % (ref 36.0–46.0)
Hemoglobin: 12.9 g/dL (ref 12.0–15.0)
Immature Granulocytes: 1 %
Lymphocytes Relative: 30 %
Lymphs Abs: 1.2 10*3/uL (ref 0.7–4.0)
MCH: 31.8 pg (ref 26.0–34.0)
MCHC: 32.7 g/dL (ref 30.0–36.0)
MCV: 97.3 fL (ref 80.0–100.0)
Monocytes Absolute: 0.3 10*3/uL (ref 0.1–1.0)
Monocytes Relative: 8 %
Neutro Abs: 2 10*3/uL (ref 1.7–7.7)
Neutrophils Relative %: 50 %
Platelets: 153 10*3/uL (ref 150–400)
RBC: 4.06 MIL/uL (ref 3.87–5.11)
RDW: 13.7 % (ref 11.5–15.5)
WBC: 4 10*3/uL (ref 4.0–10.5)
nRBC: 0 % (ref 0.0–0.2)

## 2019-09-23 LAB — I-STAT BETA HCG BLOOD, ED (MC, WL, AP ONLY): I-stat hCG, quantitative: <5 m[IU]/mL (ref <5)

## 2019-09-23 MED ORDER — DIPHENHYDRAMINE HCL 50 MG/ML IJ SOLN
25.0000 mg | Freq: Once | INTRAMUSCULAR | Status: AC
  Administered 2019-09-23: 25 mg via INTRAVENOUS
  Filled 2019-09-23: qty 1

## 2019-09-23 MED ORDER — METOCLOPRAMIDE HCL 5 MG/ML IJ SOLN
10.0000 mg | Freq: Once | INTRAMUSCULAR | Status: AC
  Administered 2019-09-23: 10 mg via INTRAVENOUS
  Filled 2019-09-23: qty 2

## 2019-09-23 MED ORDER — SODIUM CHLORIDE 0.9 % IV BOLUS
1000.0000 mL | Freq: Once | INTRAVENOUS | Status: AC
  Administered 2019-09-23: 1000 mL via INTRAVENOUS

## 2019-09-23 MED ORDER — KETOROLAC TROMETHAMINE 30 MG/ML IJ SOLN
30.0000 mg | Freq: Once | INTRAMUSCULAR | Status: AC
  Administered 2019-09-23: 30 mg via INTRAVENOUS
  Filled 2019-09-23: qty 1

## 2019-09-23 NOTE — ED Provider Notes (Signed)
[facility] EMERGENCY DEPARTMENT Provider Note   CSN: [id number] Arrival date & time: 09/23/19  0605     History   Chief Complaint Chief Complaint  Patient presents with  . Migraine    HPI Kelsey Rodriguez is a 28 y.o. female.     The history is provided by the patient and medical records. No language interpreter was used.  Migraine     28 year old female with history of HIV, prior meningitis, TB, presenting for evaluation of headache.  Patient report for the past 2 to 3 weeks she has had pain throughout her back.  She described pain as a sharp achy sensation along her spine which radiates towards her neck.  For the past 4 days she endorsed increasing neck pain as well as throbbing headache.  Pain is moderate in severity, worse with movement and more noticeable at nighttime.  There is no associated fever or chills no light or sound sensitivity, no nausea vomiting or diarrhea no focal numbness or focal weakness or rash.  She denies any recent strenuous activities or heavy lifting or any injury.  She report been seen and managed by her orthopedist for her back pain recently and was prescribed prednisone and muscle relaxant.  She has been taking the medication without adequate relief.  She was told to return for MRI if no improvement.  She did recall having prior meningitis 2 years ago in which she had a lumbar puncture but was not admitted.  She did develop post LP headache.  She reports some similarity of her pain to prior headaches.  She has not follow-up with her ID specialist for her HIV and admits to not always been compliant with her HIV medication.  She does not know her last CD4 count or viral load.  Denies any URI symptoms.  Denies any recent sick contact with anyone with COVID-19.  Past Medical History:  Diagnosis Date  . 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 Adrenal insufficiency ([place])   . Anemia of chronic disease 03/11/2012  . Back pain of lumbar  region with sciatica 02/12/2015  . Bell's palsy 08/26/2013  . 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 Chronic back pain   . Chronic leg pain    bilateral knees, ankles  . Fatigue   . 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 HIV (human immunodeficiency virus infection) ([place]) 02/2012  . Laceration of ankle, right 11/18/2012  . Lumbar radiculopathy   . Pelvic pain   . Reflux esophagitis 03/11/2012  . Tuberculosis   . 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 Vertigo     Patient Active Problem List   Diagnosis Date Noted  . Pruritus 08/29/2019  . Tinea pedis of both feet 08/29/2019  . Tendinopathy of left shoulder 01/18/2019  . Chronic pelvic pain in female 01/04/2019  . Lower abdominal pain 06/21/2018  . Sore throat 05/11/2018  . Encounter for fertility planning 03/30/2018  . Syphilis 02/26/2018  . PID (acute pelvic inflammatory disease) 02/26/2018  . Meningitis 02/18/2018  . Tuberculosis   . Infertility, female 02/12/2018  . Lumbar back pain 10/21/2017  . 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 Presence of right artificial hip joint 08/24/2017  . Acute right-sided low back pain with right-sided sciatica 08/24/2017  . Complex regional pain syndrome 02/03/2017  . Irregular menstrual cycle 02/03/2017  . Headache 10/28/2016  . Abnormal uterine bleeding 08/07/2016  . Avascular necrosis of bone of right hip ([place]) 04/04/2016  . Status post total  replacement of right hip 04/04/2016  . Avascular necrosis of bone of left hip ([place]) 12/14/2015  . 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 Overweight (BMI 25.0-29.9) 04/20/2015  . Sciatica of left side 02/12/2015  . Primary adrenal insufficiency ([place]) 01/03/2015  . HIV (human immunodeficiency virus infection) ([place]) 03/16/2012  . 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 Iron deficiency anemia 03/11/2012  . Reflux esophagitis 03/11/2012    Past Surgical History:  Procedure Laterality Date  .  APPENDECTOMY  ~ 2000  . DILATION AND CURETTAGE OF UTERUS  2008  . ESOPHAGOGASTRODUODENOSCOPY  03/11/2012   Procedure: ESOPHAGOGASTRODUODENOSCOPY (EGD);  Surgeon: Lafayette Dragon, MD;  Location: [facility] ENDOSCOPY;  Service: Endoscopy;  Laterality: N/A;  . ESOPHAGOGASTRODUODENOSCOPY N/A 03/07/2014   Procedure: ESOPHAGOGASTRODUODENOSCOPY (EGD);  Surgeon: Gatha Mayer, MD;  Location[facility] ENDOSCOPY;  Service: Endoscopy;  Laterality: N/A;  . LUNG BIOPSY  02/2012  . TOTAL HIP ARTHROPLASTY Left 12/14/2015   Procedure: LEFT TOTAL HIP ARTHROPLASTY ANTERIOR APPROACH;  Surgeon: Mcarthur Rossetti, MD;  Location: [facility] ORS;  Service: Orthopedics;  Laterality: Left;  . TOTAL HIP ARTHROPLASTY Right 04/04/2016   Procedure: RIGHT TOTAL HIP ARTHROPLASTY ANTERIOR APPROACH;  Surgeon: Mcarthur Rossetti, MD;  Location: [facility] ORS;  Service: Orthopedics;  Laterality: Right;     OB History    Gravida  1   Para  0   Term  0   Preterm  0   AB  1   Living  0     SAB  1   TAB  0   Ectopic  0   Multiple  0   Live Births               Home Medications    Prior to Admission medications   Medication Sig Start Date End Date Taking? Authorizing Provider  acetaminophen-codeine (TYLENOL #3) 300-30 MG tablet Take 1-2 tablets by mouth every 8 (eight) hours as needed for moderate pain. 09/12/19   Mcarthur Rossetti, MD  Darunavir-Cobicisctat-Emtricitabine-Tenofovir Alafenamide [facility]) 800-150-200-10 MG TABS  01/28/19   [provider]  gabapentin (NEURONTIN) 100 MG capsule Take 1 capsule (100 mg total) by mouth 3 (three) times daily. 07/13/18   Nuala Alpha, DO  meloxicam (MOBIC) 15 MG tablet TAKE 1 TABLET BY MOUTH DAILY FOR 14 DAYS 01/18/19   [provider]  methylPREDNISolone (MEDROL) 4 MG tablet Medrol dose pack. Take as instructed 09/12/19   Mcarthur Rossetti, MD  norethindrone (AYGESTIN) 5 MG tablet Take 5 mg by mouth daily. 03/24/19 03/23/20  [provider]  omeprazole  (PRILOSEC) 20 MG capsule Take 1 capsule (20 mg total) by mouth 2 (two) times daily before a meal. 09/02/18   Domenic Moras, PA-C  omeprazole (PRILOSEC) 20 MG capsule Take by mouth. 09/02/18   [provider]  Polyethylene Glycol 3350 (PEG 3350) POWD Take by mouth. 02/07/19   [provider]  polyethylene glycol powder (GLYCOLAX/MIRALAX) powder Take 17 g by mouth 2 (two) times daily as needed for mild constipation. 02/07/19   Everrett Coombe, MD  predniSONE (STERAPRED UNI-PAK 21 TAB) 10 MG (21) TBPK tablet Take by mouth daily. Take as directed. 10/14/18   Vanessa Kick, MD  senna-docusate (SENOKOT-S) 8.6-50 MG tablet Take 1 tablet by mouth 2 (two) times daily. 06/14/18   Lockamy, Timothy, DO  senna-docusate (SENOKOT-S) 8.6-50 MG tablet Take by mouth. 06/14/18   [provider]  sulfamethoxazole-trimethoprim (BACTRIM) 400-80 MG tablet Take 1 tablet by mouth daily.  05/16/19   Carlyle Basques, MD  SUMAtriptan (IMITREX) 50 MG tablet Take 1 tablet (50 mg total) by mouth every 2 (two) hours as needed for migraine. May repeat in 2 hours if headache persists or recurs. 07/13/18   Carlyle Basques, MD  SUMAtriptan (IMITREX) 50 MG tablet Take by mouth. 07/13/18   [provider]  SYMTUZA 800-150-200-10 MG TABS TAKE 1 TABLET BY MOUTH DAILY WITH BREAKFAST. 03/21/19   Carlyle Basques, MD  SYMTUZA 800-150-200-10 MG TABS TAKE 1 TABLET BY MOUTH DAILY WITH BREAKFAST. PLEASE SCHEDULE AN APPOINTMENT FOR FURTHER FILLS 07/18/19   Carlyle Basques, MD  terbinafine (LAMISIL) 1 % cream Apply 1 application topically 2 (two) times daily. 08/29/19   Stark Klein, MD  tiZANidine (ZANAFLEX) 4 MG tablet Take 1 tablet (4 mg total) by mouth every 8 (eight) hours as needed for muscle spasms. 09/12/19   Mcarthur Rossetti, MD    Family History Family History  Problem Relation Age of Onset  . Heart disease Father        Vague not clearly cardiac    Social History Social History   Tobacco Use  . Smoking  status: Never Smoker  . Smokeless tobacco: Never Used  Substance Use Topics  . Alcohol use: No    Alcohol/week: 0.0 standard drinks    Comment: socially  . Drug use: No     Allergies   Hydrocodone and Tramadol   Review of Systems Review of Systems  All other systems reviewed and are negative.    Physical Exam Updated Vital Signs BP 126/88 (BP Location: Right Arm)   Pulse 93   Temp 98.9 F (37.2 C) (Oral)   Resp 20   LMP 09/05/2019 (Approximate)   SpO2 100%   Physical Exam Vitals signs and nursing note reviewed.  Constitutional:      General: She is not in acute distress.    Appearance: She is well-developed.     Comments: Patient is well-appearing and nontoxic  HENT:     Head: Atraumatic.  Eyes:     Conjunctiva/sclera: Conjunctivae normal.  Neck:     Musculoskeletal: Neck supple. Muscular tenderness (Mild tenderness to left lateral neck on palpation without any overlying skin changes.  Neck with full range of motion and no nuchal rigidity.) present. No neck rigidity.  Cardiovascular:     Rate and Rhythm: Normal rate and regular rhythm.     Pulses: Normal pulses.     Heart sounds: Normal heart sounds.  Pulmonary:     Effort: Pulmonary effort is normal.     Breath sounds: Normal breath sounds.  Abdominal:     Palpations: Abdomen is soft.     Tenderness: There is no abdominal tenderness.  Musculoskeletal:     Comments: No significant midline spine tenderness on exam.  Skin:    Findings: No rash.  Neurological:     Mental Status: She is alert and oriented to person, place, and time.     GCS: GCS eye subscore is 4. GCS verbal subscore is 5. GCS motor subscore is 6.     Cranial Nerves: Cranial nerves are intact.     Sensory: Sensation is intact.     Motor: Motor function is intact.     Coordination: Coordination is intact.     Gait: Gait is intact.      ED Treatments / Results  Labs (all labs ordered are listed, but only abnormal results are displayed)  Labs Reviewed  COMPREHENSIVE METABOLIC PANEL - Abnormal; Notable  for the following components:      Result Value   CO2 19 (*)    Glucose, Bld 151 (*)    All other components within normal limits  CBC WITH DIFFERENTIAL/PLATELET  I-STAT BETA HCG BLOOD, ED ([facility], [facility], [facility] ONLY)    EKG None  Radiology No results found.  Procedures Procedures (including critical care time)  Medications Ordered in ED Medications  ketorolac (TORADOL) 30 MG/ML injection 30 mg (30 mg Intravenous Given 09/23/19 0910)  diphenhydrAMINE (BENADRYL) injection 25 mg (25 mg Intravenous Given 09/23/19 0910)  metoCLOPramide (REGLAN) injection 10 mg (10 mg Intravenous Given 09/23/19 0910)  sodium chloride 0.9 % bolus 1,000 mL (0 mLs Intravenous Stopped 09/23/19 1059)     Initial Impression / Assessment and Plan / ED Course  I have reviewed the triage vital signs and the nursing notes.  Pertinent labs & imaging results that were available during my care of the patient were reviewed by me and considered in my medical decision making (see chart for details).        BP 107/80   Pulse 77   Temp 98.9 F (37.2 C) (Oral)   Resp 19   LMP 09/05/2019 (Approximate)   SpO2 99%    Final Clinical Impressions(s) / ED Diagnoses   Final diagnoses:  Bad headache    ED Discharge Orders    None     8:55 AM Patient here with headache neck pain and back pain.  Back pain is a recurrent problem with underlying history of chronic back pain.  She endorsed neck pain however she has no focal neuro deficit nor does she have any nuchal rigidity to suggest meningitis.  She is afebrile, vital signs stable and well-appearing.  I have low suspicion for bacterial meningitis causing her symptoms, when considering the duration of her symptom.  I do not think lumbar puncture is beneficial during this ER visit.  Will provide migraine cocktail.  Her labs are reassuring.  Pregnancy test is negative.  2:41 PM After receiving migraine  cocktail, patient resting comfortably and slept for several hours, she woke up feeling much more rejuvenate and headache resolved.  At this time she is stable for discharge.  Recommend patient to follow-up with ID specialist for further management of her underlying HIV.  Patient is then to return if her condition worsen.  Care discussed with DR. Kohut.    Domenic Moras, PA-C 09/23/19 1451    Virgel Manifold, MD 09/24/19 [id number]

## 2019-09-23 NOTE — Discharge Instructions (Signed)
Please follow up with your doctor for further management of your health.  Remember to follow up with your Infectious Disease specialist for close monitoring of your HIV status.  Return if you have any concerns.

## 2019-09-23 NOTE — ED Triage Notes (Signed)
Patient with headache for the last 3 days, which moves into her neck and back.  The back pain has been going on longer and she was seen by an orthopedist and was given some meds.  No nausea or vomiting at this time.  She states that she has not had much of an appetite.  No fevers.

## 2019-09-26 ENCOUNTER — Ambulatory Visit (INDEPENDENT_AMBULATORY_CARE_PROVIDER_SITE_OTHER): Payer: Medicaid Other | Admitting: Orthopaedic Surgery

## 2019-09-26 ENCOUNTER — Other Ambulatory Visit: Payer: Self-pay

## 2019-09-26 ENCOUNTER — Encounter: Payer: Self-pay | Admitting: Orthopaedic Surgery

## 2019-09-26 DIAGNOSIS — M545 Low back pain, unspecified: Secondary | ICD-10-CM

## 2019-09-26 NOTE — Progress Notes (Signed)
The patient comes in today for further action and treatment of low back pain and now neck pain.  States went to the emergency room this past Friday due to severe neck pain that radiate up into her head.  She was given a migraine cocktail from the emergency room staff.  I was able to review her chart.  She said that helped greatly.  She still reports some chronic neck pain and low back pain as well as right hip pain.  We have replaced her right hip before.  She is scheduled to go back to work this coming weekend and she said she does not need a note for that and she feels that she is better and can go back to work.  On examination today there is nothing that stands out from her exam.  Her range of motion of her cervical and lumbar spine are normal.  She has excellent strength in her upper and lower extremities and normal range of motion of her right hip.  At this point all question concerns were answered addressed.  She seems to be doing well overall so follow-up can be as needed.

## 2019-09-28 ENCOUNTER — Other Ambulatory Visit: Payer: Self-pay

## 2019-09-28 ENCOUNTER — Telehealth: Payer: Self-pay | Admitting: Orthopaedic Surgery

## 2019-09-28 DIAGNOSIS — M4807 Spinal stenosis, lumbosacral region: Secondary | ICD-10-CM

## 2019-09-28 NOTE — Telephone Encounter (Signed)
Sent order for MRI, patient aware

## 2019-09-28 NOTE — Telephone Encounter (Signed)
Patient called stating that her back pain has not gotten any better.  She also stated that she is not able to stand on her feet for very long.  CB#[phone].  Thank you.

## 2019-09-28 NOTE — Telephone Encounter (Signed)
Order a new MRI of her lumbar spine then.  That is all we can really do at this point given her pain.  Her MRI of the spine in Sept 2019 was normal.

## 2019-09-28 NOTE — Telephone Encounter (Signed)
Please advise 

## 2019-09-29 ENCOUNTER — Encounter: Payer: Self-pay | Admitting: Orthopaedic Surgery

## 2019-10-01 ENCOUNTER — Other Ambulatory Visit: Payer: Self-pay

## 2019-10-01 ENCOUNTER — Emergency Department (HOSPITAL_COMMUNITY)
Admission: EM | Admit: 2019-10-01 | Discharge: 2019-10-01 | Disposition: A | Discharge status: Home / Self Care | Payer: Medicaid Other | Attending: Emergency Medicine | Admitting: Emergency Medicine

## 2019-10-01 ENCOUNTER — Encounter (HOSPITAL_COMMUNITY): Payer: Self-pay | Admitting: Emergency Medicine

## 2019-10-01 DIAGNOSIS — Z79899 Other long term (current) drug therapy: Secondary | ICD-10-CM | POA: Diagnosis not present

## 2019-10-01 DIAGNOSIS — M545 Low back pain, unspecified: Secondary | ICD-10-CM

## 2019-10-01 DIAGNOSIS — Z21 Asymptomatic human immunodeficiency virus [HIV] infection status: Secondary | ICD-10-CM | POA: Insufficient documentation

## 2019-10-01 MED ORDER — PREDNISONE 50 MG PO TABS: 50.0000 mg | ORAL_TABLET | Freq: Every day | ORAL | 0 refills | Status: DC

## 2019-10-01 MED ORDER — OXYCODONE-ACETAMINOPHEN 5-325 MG PO TABS
2.0000 | ORAL_TABLET | Freq: Once | ORAL | Status: AC
  Administered 2019-10-01: 08:00:00 2 via ORAL
  Filled 2019-10-01: qty 2

## 2019-10-01 MED ORDER — OXYCODONE-ACETAMINOPHEN 5-325 MG PO TABS: 1.0000 | ORAL_TABLET | Freq: Four times a day (QID) | ORAL | 0 refills | Status: DC | PRN

## 2019-10-01 MED ORDER — DEXAMETHASONE SODIUM PHOSPHATE 10 MG/ML IJ SOLN
10.0000 mg | Freq: Once | INTRAMUSCULAR | Status: AC
  Administered 2019-10-01: 10 mg via INTRAMUSCULAR
  Filled 2019-10-01: qty 1

## 2019-10-01 MED ORDER — DIAZEPAM 5 MG PO TABS: 5.0000 mg | ORAL_TABLET | Freq: Three times a day (TID) | ORAL | 0 refills | Status: DC | PRN

## 2019-10-01 MED ORDER — KETOROLAC TROMETHAMINE 60 MG/2ML IM SOLN
60.0000 mg | Freq: Once | INTRAMUSCULAR | Status: AC
  Administered 2019-10-01: 60 mg via INTRAMUSCULAR
  Filled 2019-10-01: qty 2

## 2019-10-01 NOTE — ED Provider Notes (Signed)
[facility] EMERGENCY DEPARTMENT Provider Note   CSN: [id number] Arrival date & time: 10/01/19  0549     History   Chief Complaint No chief complaint on file.   HPI Kelsey Rodriguez is a 28 y.o. female.     HPI Patient presents to the emergency department with lower back pain that started last week.  The patient states that she saw her orthopedist who ordered an MRI but has yet to have this done.  Patient states that the pain is worse with certain movements and palpation.  Patient states that she did not take any medications other than a muscle relaxant prior to arrival.  She states this did not help with her pain.  Patient states that the pain does not seem to radiate.  Patient denies chest pain, shortness of breath, nausea, vomiting, weakness, numbness, dizziness, neck pain, near-syncope or syncope. Past Medical History:  Diagnosis Date  . 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 Adrenal insufficiency ([place])   . Anemia of chronic disease 03/11/2012  . Back pain of lumbar region with sciatica 02/12/2015  . Bell's palsy 08/26/2013  . 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 Chronic back pain   . Chronic leg pain    bilateral knees, ankles  . Fatigue   . 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 HIV (human immunodeficiency virus infection) ([place]) 02/2012  . Laceration of ankle, right 11/18/2012  . Lumbar radiculopathy   . Pelvic pain   . Reflux esophagitis 03/11/2012  . Tuberculosis   . 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 Vertigo     Patient Active Problem List   Diagnosis Date Noted  . Pruritus 08/29/2019  . Tinea pedis of both feet 08/29/2019  . Tendinopathy of left shoulder 01/18/2019  . Chronic pelvic pain in female 01/04/2019  . Lower abdominal pain 06/21/2018  . Sore throat 05/11/2018  . Encounter for fertility planning 03/30/2018  . Syphilis 02/26/2018  . PID (acute pelvic inflammatory disease) 02/26/2018  . Meningitis 02/18/2018  . Tuberculosis    . Infertility, female 02/12/2018  . Lumbar back pain 10/21/2017  . 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 Presence of right artificial hip joint 08/24/2017  . Acute right-sided low back pain with right-sided sciatica 08/24/2017  . Complex regional pain syndrome 02/03/2017  . Irregular menstrual cycle 02/03/2017  . Headache 10/28/2016  . Abnormal uterine bleeding 08/07/2016  . Avascular necrosis of bone of right hip ([place]) 04/04/2016  . 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 Avascular necrosis of bone of left hip ([place]) 12/14/2015  . 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 Overweight (BMI 25.0-29.9) 04/20/2015  . Sciatica of left side 02/12/2015  . Primary adrenal insufficiency ([place]) 01/03/2015  . HIV (human immunodeficiency virus infection) ([place]) 03/16/2012  . 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 Iron deficiency anemia 03/11/2012  . Reflux esophagitis 03/11/2012    Past Surgical History:  Procedure Laterality Date  . APPENDECTOMY  ~ 2000  . DILATION AND CURETTAGE OF UTERUS  2008  . ESOPHAGOGASTRODUODENOSCOPY  03/11/2012   Procedure: ESOPHAGOGASTRODUODENOSCOPY (EGD);  Surgeon: Lafayette Dragon, MD;  [facility] END[facility]COPY;  Service: Endoscopy;  Laterality: N/A;  . ESOPHAGOGASTRODUODENOSCOPY N/A 03/07/2014   Procedure: ESOPHAGOGASTRODUODENOSCOPY (EGD);  Surgeon: Gatha Mayer, MD;  Location: [facility] ENDOSCOPY;  Service: Endoscopy;  Laterality: N/A;  . LUNG BIOPSY  02/2012  . TOTAL HIP ARTHROPLASTY Left 12/14/2015   Procedure: LEFT TOTAL HIP ARTHROPLASTY  ANTERIOR APPROACH;  Surgeon: Mcarthur Rossetti, MD;  Location: [facility] ORS;  Service: Orthopedics;  Laterality: Left;  . TOTAL HIP ARTHROPLASTY Right 04/04/2016   Procedure: RIGHT TOTAL HIP ARTHROPLASTY ANTERIOR APPROACH;  Surgeon: Mcarthur Rossetti, MD;  Location: [facility] ORS;  Service: Orthopedics;  Laterality: Right;     OB History    Gravida  1   Para  0   Term  0    Preterm  0   AB  1   Living  0     SAB  1   TAB  0   Ectopic  0   Multiple  0   Live Births               Home Medications    Prior to Admission medications   Medication Sig Start Date End Date Taking? Authorizing Provider  acetaminophen-codeine (TYLENOL #3) 300-30 MG tablet Take 1-2 tablets by mouth every 8 (eight) hours as needed for moderate pain. 09/12/19   Mcarthur Rossetti, MD  Darunavir-Cobicisctat-Emtricitabine-Tenofovir Alafenamide [facility]) 800-150-200-10 MG TABS  01/28/19   [provider]  gabapentin (NEURONTIN) 100 MG capsule Take 1 capsule (100 mg total) by mouth 3 (three) times daily. 07/13/18   Nuala Alpha, DO  meloxicam (MOBIC) 15 MG tablet TAKE 1 TABLET BY MOUTH DAILY FOR 14 DAYS 01/18/19   [provider]  methylPREDNISolone (MEDROL) 4 MG tablet Medrol dose pack. Take as instructed 09/12/19   Mcarthur Rossetti, MD  norethindrone (AYGESTIN) 5 MG tablet Take 5 mg by mouth daily. 03/24/19 03/23/20  [provider]  omeprazole (PRILOSEC) 20 MG capsule Take 1 capsule (20 mg total) by mouth 2 (two) times daily before a meal. 09/02/18   Domenic Moras, PA-C  omeprazole (PRILOSEC) 20 MG capsule Take by mouth. 09/02/18   [provider]  Polyethylene Glycol 3350 (PEG 3350) POWD Take by mouth. 02/07/19   [provider]  polyethylene glycol powder (GLYCOLAX/MIRALAX) powder Take 17 g by mouth 2 (two) times daily as needed for mild constipation. 02/07/19   Everrett Coombe, MD  predniSONE (STERAPRED UNI-PAK 21 TAB) 10 MG (21) TBPK tablet Take by mouth daily. Take as directed. 10/14/18   Vanessa Kick, MD  senna-docusate (SENOKOT-S) 8.6-50 MG tablet Take 1 tablet by mouth 2 (two) times daily. 06/14/18   Lockamy, Timothy, DO  senna-docusate (SENOKOT-S) 8.6-50 MG tablet Take by mouth. 06/14/18   [provider]  sulfamethoxazole-trimethoprim (BACTRIM) 400-80 MG tablet Take 1 tablet by mouth daily. 05/16/19   Carlyle Basques, MD   SUMAtriptan (IMITREX) 50 MG tablet Take 1 tablet (50 mg total) by mouth every 2 (two) hours as needed for migraine. May repeat in 2 hours if headache persists or recurs. 07/13/18   Carlyle Basques, MD  SUMAtriptan (IMITREX) 50 MG tablet Take by mouth. 07/13/18   [provider]  SYMTUZA 800-150-200-10 MG TABS TAKE 1 TABLET BY MOUTH DAILY WITH BREAKFAST. 03/21/19   Carlyle Basques, MD  SYMTUZA 800-150-200-10 MG TABS TAKE 1 TABLET BY MOUTH DAILY WITH BREAKFAST. PLEASE SCHEDULE AN APPOINTMENT FOR FURTHER FILLS 07/18/19   Carlyle Basques, MD  terbinafine (LAMISIL) 1 % cream Apply 1 application topically 2 (two) times daily. 08/29/19   Stark Klein, MD  tiZANidine (ZANAFLEX) 4 MG tablet Take 1 tablet (4 mg total) by mouth every 8 (eight) hours as needed for muscle spasms. 09/12/19   Mcarthur Rossetti, MD    Family History Family History  Problem Relation Age of Onset  . Heart disease  Father        Vague not clearly cardiac    Social History Social History   Tobacco Use  . Smoking status: Never Smoker  . Smokeless tobacco: Never Used  Substance Use Topics  . Alcohol use: No    Alcohol/week: 0.0 standard drinks    Comment: socially  . Drug use: No     Allergies   Hydrocodone and Tramadol   Review of Systems Review of Systems  All other systems negative except as documented in the HPI. All pertinent positives and negatives as reviewed in the HPI. Physical Exam Updated Vital Signs BP 123/90   Pulse 79   Temp 98.8 F (37.1 C) (Oral)   Resp 18   Ht 5\' 5"  (1.651 m)   Wt 83.7 kg   LMP 09/05/2019 (Approximate)   SpO2 99%   BMI 30.71 kg/m   Physical Exam Vitals signs and nursing note reviewed.  Constitutional:      General: She is not in acute distress.    Appearance: She is well-developed.  HENT:     Head: Normocephalic and atraumatic.  Eyes:     Pupils: Pupils are equal, round, and reactive to light.  Pulmonary:     Effort: Pulmonary effort is normal.   Musculoskeletal:     Lumbar back: She exhibits tenderness. She exhibits no bony tenderness.       Back:  Skin:    General: Skin is warm and dry.  Neurological:     Mental Status: She is alert and oriented to person, place, and time.     Sensory: Sensation is intact. No sensory deficit.     Motor: No weakness or abnormal muscle tone.     Gait: Gait normal.     Deep Tendon Reflexes:     Reflex Scores:      Patellar reflexes are 2+ on the right side and 2+ on the left side.      Achilles reflexes are 2+ on the right side and 2+ on the left side.     ED Treatments / Results  Labs (all labs ordered are listed, but only abnormal results are displayed) Labs Reviewed - No data to display  EKG None  Radiology No results found.  Procedures Procedures (including critical care time)  Medications Ordered in ED Medications  ketorolac (TORADOL) injection 60 mg (60 mg Intramuscular Given 10/01/19 0800)  dexamethasone (DECADRON) injection 10 mg (10 mg Intramuscular Given 10/01/19 0758)  oxyCODONE-acetaminophen (PERCOCET/ROXICET) 5-325 MG per tablet 2 tablet (2 tablets Oral Given 10/01/19 0758)     Initial Impression / Assessment and Plan / ED Course  I have reviewed the triage vital signs and the nursing notes.  Pertinent labs & imaging results that were available during my care of the patient were reviewed by me and considered in my medical decision making (see chart for details).       Patient has no neurological deficits noted on examination and normal reflexes.  Patient is advised she will need to call her orthopedist for further evaluation and care.  Advised her that she will need to have the MRI done.  I did review an MRI that was done last September which did not show any acute abnormality.  Final Clinical Impressions(s) / ED Diagnoses   Final diagnoses:  None    ED Discharge Orders    None       Dalia Heading, PA-C 10/01/19 [id number]    Isla Pence, MD  10/01/19 [phone]

## 2019-10-01 NOTE — Discharge Instructions (Addendum)
Follow-up with your orthopedist as soon as possible.  Use ice and heat on your lower back.

## 2019-10-01 NOTE — ED Triage Notes (Signed)
Pt back to the ED this morning with c/o back pain , pt has been taking muscle relaxer without relief , pt has an out pt Mri scheduled

## 2019-10-04 ENCOUNTER — Ambulatory Visit: Payer: Medicaid Other | Admitting: Family Medicine

## 2019-10-04 ENCOUNTER — Encounter: Payer: Self-pay | Admitting: Orthopaedic Surgery

## 2019-10-04 NOTE — Progress Notes (Deleted)
   Subjective:    Patient ID: Kelsey Rodriguez, female    DOB: 10/01/1991, 28 y.o.   MRN: [id number]   CC: f/u for tenia pedis  HPI:   Smoking status reviewed  Review of Systems Per HPI, also denies recent illness, fever, headache, changes in vision, chest pain, shortness of breath, abdominal pain, N/V/D, weakness   Patient Active Problem List   Diagnosis Date Noted  . Pruritus 08/29/2019  . Tinea pedis of both feet 08/29/2019  . Tendinopathy of left shoulder 01/18/2019  . Chronic pelvic pain in female 01/04/2019  . Lower abdominal pain 06/21/2018  . Syphilis 02/26/2018  . PID (acute pelvic inflammatory disease) 02/26/2018  . Meningitis 02/18/2018  . Tuberculosis   . Infertility, female 02/12/2018  . Lumbar back pain 10/21/2017  . 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 Presence of right artificial hip joint 08/24/2017  . Acute right-sided low back pain with right-sided sciatica 08/24/2017  . Complex regional pain syndrome 02/03/2017  . Irregular menstrual cycle 02/03/2017  . Headache 10/28/2016  . Avascular necrosis of bone of right hip ([place]) 04/04/2016  . 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 Avascular necrosis of bone of left hip ([place]) 12/14/2015  . 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 Overweight (BMI 25.0-29.9) 04/20/2015  . Sciatica of left side 02/12/2015  . Primary adrenal insufficiency ([place]) 01/03/2015  . HIV (human immunodeficiency virus infection) ([place]) 03/16/2012  . 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 Reflux esophagitis 03/11/2012     Objective:  LMP 09/05/2019 (Approximate)  Vitals and nursing note reviewed  General: NAD, pleasant Cardiac: RRR, normal heart sounds, no murmurs Respiratory: CTAB, normal effort Abdomen: soft, nontender, nondistended Extremities: no edema or cyanosis. WWP. Skin: warm and dry, no rashes noted Neuro: alert and oriented, no focal deficits Psych:  normal affect  Assessment & Plan:    No problem-specific Assessment & Plan notes found for this encounter.    Gerlene Fee, [facility] PGY-1

## 2019-10-06 ENCOUNTER — Other Ambulatory Visit: Payer: Self-pay

## 2019-10-06 ENCOUNTER — Emergency Department (HOSPITAL_COMMUNITY)
Admission: EM | Admit: 2019-10-06 | Discharge: 2019-10-06 | Disposition: A | Discharge status: Home / Self Care | Payer: Medicaid Other | Attending: Emergency Medicine | Admitting: Emergency Medicine

## 2019-10-06 ENCOUNTER — Encounter (HOSPITAL_COMMUNITY): Payer: Self-pay | Admitting: Emergency Medicine

## 2019-10-06 DIAGNOSIS — M545 Low back pain, unspecified: Secondary | ICD-10-CM

## 2019-10-06 DIAGNOSIS — Z79899 Other long term (current) drug therapy: Secondary | ICD-10-CM | POA: Diagnosis not present

## 2019-10-06 DIAGNOSIS — G8929 Other chronic pain: Secondary | ICD-10-CM | POA: Diagnosis not present

## 2019-10-06 DIAGNOSIS — Z21 Asymptomatic human immunodeficiency virus [HIV] infection status: Secondary | ICD-10-CM | POA: Insufficient documentation

## 2019-10-06 MED ORDER — KETOROLAC TROMETHAMINE 30 MG/ML IJ SOLN
30.0000 mg | Freq: Once | INTRAMUSCULAR | Status: AC
  Administered 2019-10-06: 11:00:00 30 mg via INTRAMUSCULAR
  Filled 2019-10-06: qty 1

## 2019-10-06 NOTE — ED Triage Notes (Signed)
Pt reports ongoing back pain for 2 months. Pt reports going to an orthopedist and having treatment but still no relief. Pt reports having an upcoming MRI but has not heard about an appointment time.

## 2019-10-06 NOTE — ED Notes (Signed)
Patient verbalizes understanding of discharge instructions. Opportunity for questioning and answers were provided. Armband removed by staff, pt discharged from ED. Ambulated out to lobby  

## 2019-10-06 NOTE — Discharge Instructions (Addendum)
Please read attached information. If you experience any new or worsening signs or symptoms please return to the emergency room for evaluation. Please follow-up with your primary care provider or specialist as discussed.  °

## 2019-10-06 NOTE — ED Provider Notes (Signed)
[facility] EMERGENCY DEPARTMENT Provider Note   CSN: [id number] Arrival date & time: 10/06/19  [id number]     History   Chief Complaint Chief Complaint  Patient presents with  . Back Pain    HPI Kelsey Rodriguez is a 28 y.o. female.     HPI   28 year old female presents today with complaints of chronic back pain.  Patient notes history of lower back pain for the last 6 weeks.  She denies any loss of distal sensation strength or motor function abdominal pain fever or any other red flags.  She is currently seen by orthopedics who has ordered an MRI but this has not been scheduled yet.  She notes taking pain medications including oxycodone at home which has not significantly improved her symptoms.  She notes she was seen recently in the emergency room and received Toradol which did improve her symptoms.  No significant changes to her back pain.  This is worse when she bends over or lays down at night.  She is not pregnant or breast-feeding.  Past Medical History:  Diagnosis Date  . 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 Adrenal insufficiency ([place])   . Anemia of chronic disease 03/11/2012  . Back pain of lumbar region with sciatica 02/12/2015  . Bell's palsy 08/26/2013  . 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 Chronic back pain   . Chronic leg pain    bilateral knees, ankles  . Fatigue   . 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 HIV (human immunodeficiency virus infection) ([place]) 02/2012  . Laceration of ankle, right 11/18/2012  . Lumbar radiculopathy   . Pelvic pain   . Reflux esophagitis 03/11/2012  . Tuberculosis   . 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 Vertigo     Patient Active Problem List   Diagnosis Date Noted  . Pruritus 08/29/2019  . Tinea pedis of both feet 08/29/2019  . Tendinopathy of left shoulder 01/18/2019  . Chronic pelvic pain in female 01/04/2019  . Lower abdominal pain 06/21/2018  . Syphilis 02/26/2018  . PID (acute pelvic inflammatory  disease) 02/26/2018  . Meningitis 02/18/2018  . Tuberculosis   . Infertility, female 02/12/2018  . Lumbar back pain 10/21/2017  . 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 Presence of right artificial hip joint 08/24/2017  . Acute right-sided low back pain with right-sided sciatica 08/24/2017  . Complex regional pain syndrome 02/03/2017  . Irregular menstrual cycle 02/03/2017  . Headache 10/28/2016  . Avascular necrosis of bone of right hip ([place]) 04/04/2016  . 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 Avascular necrosis of bone of left hip ([place]) 12/14/2015  . 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 Overweight (BMI 25.0-29.9) 04/20/2015  . Sciatica of left side 02/12/2015  . Primary adrenal insufficiency ([place]) 01/03/2015  . HIV (human immunodeficiency virus infection) ([place]) 03/16/2012  . 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 Reflux esophagitis 03/11/2012    Past Surgical History:  Procedure Laterality Date  . APPENDECTOMY  ~ 2000  . DILATION AND CURETTAGE OF UTERUS  2008  . ESOPHAGOGASTRODUODENOSCOPY  03/11/2012   Procedure: ESOPHAGOGASTRODUODENOSCOPY (EGD);  Surgeon: Lafayette Dragon, MD;  Location: [facility] ENDOSCOPY;  Service: Endoscopy;  Laterality: N/A;  . ESOPHAGOGASTRODUODENOSCOPY N/A 03/07/2014   Procedure: ESOPHAGOGASTRODUODENOSCOPY (EGD);  Surgeon: Gatha Mayer, MD;  Location: [facility] ENDOSCOPY;  Service: Endoscopy;  Laterality: N/A;  . LUNG BIOPSY  02/2012  . TOTAL HIP ARTHROPLASTY Left 12/14/2015  Procedure: LEFT TOTAL HIP ARTHROPLASTY ANTERIOR APPROACH;  Surgeon: Mcarthur Rossetti, MD;  Location: [facility] ORS;  Service: Orthopedics;  Laterality: Left;  . TOTAL HIP ARTHROPLASTY Right 04/04/2016   Procedure: RIGHT TOTAL HIP ARTHROPLASTY ANTERIOR APPROACH;  Surgeon: Mcarthur Rossetti, MD;  Location: [facility] ORS;  Service: Orthopedics;  Laterality: Right;     OB History    Gravida  1   Para  0   Term  0   Preterm  0    AB  1   Living  0     SAB  1   TAB  0   Ectopic  0   Multiple  0   Live Births               Home Medications    Prior to Admission medications   Medication Sig Start Date End Date Taking? Authorizing Provider  acetaminophen-codeine (TYLENOL #3) 300-30 MG tablet Take 1-2 tablets by mouth every 8 (eight) hours as needed for moderate pain. 09/12/19   Mcarthur Rossetti, MD  Darunavir-Cobicisctat-Emtricitabine-Tenofovir Alafenamide [facility]) 800-150-200-10 MG TABS  01/28/19   [provider]  diazepam (VALIUM) 5 MG tablet Take 1 tablet (5 mg total) by mouth every 8 (eight) hours as needed for muscle spasms. 10/01/19   Lawyer, Harrell Gave, PA-C  gabapentin (NEURONTIN) 100 MG capsule Take 1 capsule (100 mg total) by mouth 3 (three) times daily. 07/13/18   Nuala Alpha, DO  meloxicam (MOBIC) 15 MG tablet TAKE 1 TABLET BY MOUTH DAILY FOR 14 DAYS 01/18/19   [provider]  methylPREDNISolone (MEDROL) 4 MG tablet Medrol dose pack. Take as instructed 09/12/19   Mcarthur Rossetti, MD  norethindrone (AYGESTIN) 5 MG tablet Take 5 mg by mouth daily. 03/24/19 03/23/20  [provider]  omeprazole (PRILOSEC) 20 MG capsule Take 1 capsule (20 mg total) by mouth 2 (two) times daily before a meal. 09/02/18   Domenic Moras, PA-C  omeprazole (PRILOSEC) 20 MG capsule Take by mouth. 09/02/18   [provider]  oxyCODONE-acetaminophen (PERCOCET/ROXICET) 5-325 MG tablet Take 1 tablet by mouth every 6 (six) hours as needed for severe pain. 10/01/19   Lawyer, Harrell Gave, PA-C  Polyethylene Glycol 3350 (PEG 3350) POWD Take by mouth. 02/07/19   [provider]  polyethylene glycol powder (GLYCOLAX/MIRALAX) powder Take 17 g by mouth 2 (two) times daily as needed for mild constipation. 02/07/19   Everrett Coombe, MD  predniSONE (DELTASONE) 50 MG tablet Take 1 tablet (50 mg total) by mouth daily with breakfast. 10/01/19   Lawyer, Harrell Gave, PA-C  senna-docusate  (SENOKOT-S) 8.6-50 MG tablet Take 1 tablet by mouth 2 (two) times daily. 06/14/18   Lockamy, Timothy, DO  senna-docusate (SENOKOT-S) 8.6-50 MG tablet Take by mouth. 06/14/18   [provider]  sulfamethoxazole-trimethoprim (BACTRIM) 400-80 MG tablet Take 1 tablet by mouth daily. 05/16/19   Carlyle Basques, MD  SUMAtriptan (IMITREX) 50 MG tablet Take 1 tablet (50 mg total) by mouth every 2 (two) hours as needed for migraine. May repeat in 2 hours if headache persists or recurs. 07/13/18   Carlyle Basques, MD  SUMAtriptan (IMITREX) 50 MG tablet Take by mouth. 07/13/18   [provider]  SYMTUZA 800-150-200-10 MG TABS TAKE 1 TABLET BY MOUTH DAILY WITH BREAKFAST. 03/21/19   Carlyle Basques, MD  SYMTUZA 800-150-200-10 MG TABS TAKE 1 TABLET BY MOUTH DAILY WITH BREAKFAST. PLEASE SCHEDULE AN APPOINTMENT FOR FURTHER FILLS 07/18/19   Carlyle Basques, MD  terbinafine (LAMISIL) 1 % cream Apply 1  application topically 2 (two) times daily. 08/29/19   Stark Klein, MD  tiZANidine (ZANAFLEX) 4 MG tablet Take 1 tablet (4 mg total) by mouth every 8 (eight) hours as needed for muscle spasms. 09/12/19   Mcarthur Rossetti, MD    Family History Family History  Problem Relation Age of Onset  . Heart disease Father        Vague not clearly cardiac    Social History Social History   Tobacco Use  . Smoking status: Never Smoker  . Smokeless tobacco: Never Used  Substance Use Topics  . Alcohol use: No    Alcohol/week: 0.0 standard drinks    Comment: socially  . Drug use: No     Allergies   Hydrocodone and Tramadol   Review of Systems Review of Systems  All other systems reviewed and are negative.    Physical Exam Updated Vital Signs BP 130/83 (BP Location: Right Arm)   Pulse 87   Temp 98.6 F (37 C) (Oral)   Resp 14   Ht 5\' 5"  (1.651 m)   Wt 82.6 kg   LMP 09/28/2019 (Exact Date)   SpO2 100%   BMI 30.29 kg/m   Physical Exam Vitals signs and nursing note reviewed.   Constitutional:      Appearance: She is well-developed.  HENT:     Head: Normocephalic and atraumatic.  Eyes:     General: No scleral icterus.       Right eye: No discharge.        Left eye: No discharge.     Conjunctiva/sclera: Conjunctivae normal.     Pupils: Pupils are equal, round, and reactive to light.  Neck:     Musculoskeletal: Normal range of motion.     Vascular: No JVD.     Trachea: No tracheal deviation.  Pulmonary:     Effort: Pulmonary effort is normal.     Breath sounds: No stridor.  Musculoskeletal:     Comments: Minimal tenderness to the lower lumbar midline and soft tissue bilateral lower extremity sensation strength motor function intact  Neurological:     Mental Status: She is alert and oriented to person, place, and time.     Coordination: Coordination normal.  Psychiatric:        Behavior: Behavior normal.        Thought Content: Thought content normal.        Judgment: Judgment normal.      ED Treatments / Results  Labs (all labs ordered are listed, but only abnormal results are displayed) Labs Reviewed - No data to display  EKG None  Radiology No results found.  Procedures Procedures (including critical care time)  Medications Ordered in ED Medications  ketorolac (TORADOL) 30 MG/ML injection 30 mg (has no administration in time range)     Initial Impression / Assessment and Plan / ED Course  I have reviewed the triage vital signs and the nursing notes.  Pertinent labs & imaging results that were available during my care of the patient were reviewed by me and considered in my medical decision making (see chart for details).        28 year old female with ongoing low back pain.  She is under the care of orthopedist, no significant changes, no red flags.  Treated with Toradol discharged with outpatient follow-up and return precautions.  She verbalized understanding and agreement to today's plan.  Final Clinical Impressions(s) / ED  Diagnoses   Final diagnoses:  Chronic midline low back pain,  unspecified whether sciatica present    ED Discharge Orders    None       Francee Gentile 10/06/19 1023    Dorie Rank, MD 10/07/19 [phone]

## 2019-10-18 ENCOUNTER — Ambulatory Visit: Payer: Medicaid Other | Admitting: Family Medicine

## 2019-10-20 ENCOUNTER — Ambulatory Visit (INDEPENDENT_AMBULATORY_CARE_PROVIDER_SITE_OTHER): Payer: Medicaid Other | Admitting: Family Medicine

## 2019-10-20 ENCOUNTER — Other Ambulatory Visit: Payer: Self-pay

## 2019-10-20 DIAGNOSIS — L299 Pruritus, unspecified: Secondary | ICD-10-CM

## 2019-10-20 NOTE — Patient Instructions (Signed)
Thank you for coming to see me today. It was a pleasure! Today we talked about:   Your itchy feet.  I recommend that you start trying a lotion called Sarna.  You may buy this over-the-counter at any store or pharmacy such as [facility].  This is an antiitch lotion that will hopefully help.  I do not see anything worrisome with your feet and I do not believe that it is athlete's foot.  I recommend that you try using this at night before going to bed when your itching is worse.  If the vinegar soaks help then I would soak with the vinegar and water combination and then place the lotion on your feet afterwards.  You may also try soaking your feet in tea or tea tree oil if the vinegar is causing your feet to dry out too much.  Either way depending on what soap you choose I recommend using the lotion afterwards.  If you still have trouble with the itching see after 4 to 6 weeks and I recommend coming back in for further work-up as you may need to evaluate the medications that you are on or rule out other causes.  Please follow-up with your primary care provider as needed.  If you have any questions or concerns, please do not hesitate to call the office at [phone].  Take Care,   [place] Rachelle Edwards, DO

## 2019-10-20 NOTE — Progress Notes (Signed)
  Subjective:  Patient ID: Kelsey Rodriguez  DOB: 07-Apr-1991 MRN: [id number]  Kelsey Rodriguez is a 28 y.o. female with a PMH of HIV, primary adrenal insufficiency, here today for pruritus of feet.   HPI:  Pruritic feet, BL: - Patient states that her feet remain itchy after last visit. She did not notice any improvment after using the cream prescribed (terbinafine). She denies any trauma. No new rashes. Husband sleeps in same bed and has no issue.  - describes this more like "ants are crawling and biting my feet at night". States it is worse at night. She has tried vaseline and lotions with no improvement.  -Patient reports that after scratching for prolonged periods her feet began to hurt and burn.  She reports that her feet sometimes feel like ant bites.  -She denies any alleviating factors other than scratching. Does state she tried soaking them in vinegar and water last night which may have helped.   -Patient also endorses some dark spots on the affected areas of her feet.  - denies any new meds, no changes in meds, no fevers, chills.   ROS: As mentioned in HPI  Social hx: Denies use of illicit drugs, alcohol use Smoking status reviewed  Patient Active Problem List   Diagnosis Date Noted  . Pruritus 08/29/2019  . Tendinopathy of left shoulder 01/18/2019  . Chronic pelvic pain in female 01/04/2019  . Lower abdominal pain 06/21/2018  . Syphilis 02/26/2018  . Tuberculosis   . Infertility, female 02/12/2018  . 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 Acute right-sided low back pain with right-sided sciatica 08/24/2017  . Complex regional pain syndrome 02/03/2017  . Avascular necrosis of bone of right hip ([place]) 04/04/2016  . 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 Avascular necrosis of bone of left hip ([place]) 12/14/2015  . 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 Primary adrenal insufficiency ([place]) 01/03/2015  . HIV (human immunodeficiency  virus infection) ([place]) 03/16/2012  . 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 WWP. Skin: warm and dry, no rashes noted; small hyperpigmented papules on feet, dry areas on either side but no obvious rashes. No erythema, no signs of excoriations, no open wounds Neuro: alert and oriented, no focal deficits Psych: normal affect, normal thought content  Assessment & Plan:   Pruritus Does not appear to be c/w tinea. Formication with unclear etiology. No red flags. - patient to try sarna at night before going to bed when your itching is worse - she may continue with the vinegar soaks and then place the lotion afterwards. Encouraged to try soaking your feet in tea/ tea tree oil if vinegar not helping  -Return in 4 to 6 weeks for further work-up  - may need to evaluate some medications as cause if the above plan does not work.   [place] Dorianne Perret, DO Family Medicine Resident PGY-3

## 2019-10-21 ENCOUNTER — Ambulatory Visit
Admission: RE | Admit: 2019-10-21 | Discharge: 2019-10-21 | Disposition: A | Discharge status: Home / Self Care | Payer: Medicaid Other | Source: Ambulatory Visit | Attending: Orthopaedic Surgery | Admitting: Orthopaedic Surgery

## 2019-10-21 DIAGNOSIS — M545 Low back pain: Secondary | ICD-10-CM | POA: Diagnosis not present

## 2019-10-21 DIAGNOSIS — M4807 Spinal stenosis, lumbosacral region: Secondary | ICD-10-CM

## 2019-10-24 ENCOUNTER — Encounter: Payer: Self-pay | Admitting: Family Medicine

## 2019-10-24 NOTE — Assessment & Plan Note (Signed)
Does not appear to be c/w tinea. Formication with unclear etiology. No red flags. - patient to try sarna at night before going to bed when your itching is worse - she may continue with the vinegar soaks and then place the lotion afterwards. Encouraged to try soaking your feet in tea/ tea tree oil if vinegar not helping  -Return in 4 to 6 weeks for further work-up  - may need to evaluate some medications as cause if the above plan does not work.

## 2019-10-26 ENCOUNTER — Telehealth (INDEPENDENT_AMBULATORY_CARE_PROVIDER_SITE_OTHER): Payer: Medicaid Other | Admitting: Family Medicine

## 2019-10-26 ENCOUNTER — Encounter: Payer: Self-pay | Admitting: Orthopaedic Surgery

## 2019-10-26 ENCOUNTER — Telehealth: Payer: Self-pay

## 2019-10-26 ENCOUNTER — Telehealth: Payer: Medicaid Other

## 2019-10-26 ENCOUNTER — Other Ambulatory Visit: Payer: Self-pay

## 2019-10-26 DIAGNOSIS — B20 Human immunodeficiency virus [HIV] disease: Secondary | ICD-10-CM | POA: Diagnosis not present

## 2019-10-26 DIAGNOSIS — G4489 Other headache syndrome: Secondary | ICD-10-CM

## 2019-10-26 DIAGNOSIS — N979 Female infertility, unspecified: Secondary | ICD-10-CM | POA: Diagnosis not present

## 2019-10-26 DIAGNOSIS — N9489 Other specified conditions associated with female genital organs and menstrual cycle: Secondary | ICD-10-CM | POA: Diagnosis not present

## 2019-10-26 DIAGNOSIS — N946 Dysmenorrhea, unspecified: Secondary | ICD-10-CM | POA: Diagnosis not present

## 2019-10-26 DIAGNOSIS — N7011 Chronic salpingitis: Secondary | ICD-10-CM | POA: Diagnosis not present

## 2019-10-26 MED ORDER — SUMATRIPTAN SUCCINATE 50 MG PO TABS: 50.0000 mg | ORAL_TABLET | ORAL | 0 refills | Status: DC | PRN

## 2019-10-26 NOTE — Patient Instructions (Addendum)
Migraine Headache A migraine headache is a very strong throbbing pain on one side or both sides of your head. This type of headache can also cause other symptoms. It can last from 4 hours to 3 days. Talk with your doctor about what things may bring on (trigger) this condition. What are the causes? The exact cause of this condition is not known. This condition may be triggered or caused by:  Drinking alcohol.  Smoking.  Taking medicines, such as: ? Medicine used to treat chest pain (nitroglycerin). ? Birth control pills. ? Estrogen. ? Some blood pressure medicines.  Eating or drinking certain products.  Doing physical activity. Other things that may trigger a migraine headache include:  Having a menstrual period.  Pregnancy.  Hunger.  Stress.  Not getting enough sleep or getting too much sleep.  Weather changes.  Tiredness (fatigue). What increases the risk?  Being 25-55 years old.  Being female.  Having a family history of migraine headaches.  Being Caucasian.  Having depression or anxiety.  Being very overweight. What are the signs or symptoms?  A throbbing pain. This pain may: ? Happen in any area of the head, such as on one side or both sides. ? Make it hard to do daily activities. ? Get worse with physical activity. ? Get worse around bright lights or loud noises.  Other symptoms may include: ? Feeling sick to your stomach (nauseous). ? Vomiting. ? Dizziness. ? Being sensitive to bright lights, loud noises, or smells.  Before you get a migraine headache, you may get warning signs (an aura). An aura may include: ? Seeing flashing lights or having blind spots. ? Seeing bright spots, halos, or zigzag lines. ? Having tunnel vision or blurred vision. ? Having numbness or a tingling feeling. ? Having trouble talking. ? Having weak muscles.  Some people have symptoms after a migraine headache (postdromal phase), such as: ? Tiredness. ? Trouble  thinking (concentrating). How is this treated?  Taking medicines that: ? Relieve pain. ? Relieve the feeling of being sick to your stomach. ? Prevent migraine headaches.  Treatment may also include: ? Having acupuncture. ? Avoiding foods that bring on migraine headaches. ? Learning ways to control your body functions (biofeedback). ? Therapy to help you know and deal with negative thoughts (cognitive behavioral therapy). Follow these instructions at home: Medicines  Take over-the-counter and prescription medicines only as told by your doctor.  Ask your doctor if the medicine prescribed to you: ? Requires you to avoid driving or using heavy machinery. ? Can cause trouble pooping (constipation). You may need to take these steps to prevent or treat trouble pooping:  Drink enough fluid to keep your pee (urine) pale yellow.  Take over-the-counter or prescription medicines.  Eat foods that are high in fiber. These include beans, whole grains, and fresh fruits and vegetables.  Limit foods that are high in fat and sugar. These include fried or sweet foods. Lifestyle  Do not drink alcohol.  Do not use any products that contain nicotine or tobacco, such as cigarettes, e-cigarettes, and chewing tobacco. If you need help quitting, ask your doctor.  Get at least 8 hours of sleep every night.  Limit and deal with stress. General instructions      Keep a journal to find out what may bring on your migraine headaches. For example, write down: ? What you eat and drink. ? How much sleep you get. ? Any change in what you eat or drink. ? Any   change in your medicines.  If you have a migraine headache: ? Avoid things that make your symptoms worse, such as bright lights. ? It may help to lie down in a dark, quiet room. ? Do not drive or use heavy machinery. ? Ask your doctor what activities are safe for you.  Keep all follow-up visits as told by your doctor. This is important. Contact  a doctor if:  You get a migraine headache that is different or worse than others you have had.  You have more than 15 headache days in one month. Get help right away if:  Your migraine headache gets very bad.  Your migraine headache lasts longer than 72 hours.  You have a fever.  You have a stiff neck.  You have trouble seeing.  Your muscles feel weak or like you cannot control them.  You start to lose your balance a lot.  You start to have trouble walking.  You pass out (faint).  You have a seizure. Summary  A migraine headache is a very strong throbbing pain on one side or both sides of your head. These headaches can also cause other symptoms.  This condition may be treated with medicines and changes to your lifestyle.  Keep a journal to find out what may bring on your migraine headaches.  Contact a doctor if you get a migraine headache that is different or worse than others you have had.  Contact your doctor if you have more than 15 headache days in a month. This information is not intended to replace advice given to you by your health care provider. Make sure you discuss any questions you have with your health care provider. Document Released: 09/09/2008 Document Revised: 03/25/2019 Document Reviewed: 01/13/2019 Elsevier Patient Education  [place].  Please use ibuprofen as needed as well as sumatriptan at the onset of your headache (if you wait to use this drug it is less effective).   Please follow up in 2 weeks with Dr. Posey Pronto, I would like this in person if possible so we can do a full neurological exam.   I have scheduled you for a MRI scan. We will call with these results and they will be available on mychart.   If your headache worsens, you get vision changes, or passing out spells, you have facial drooping or weakness go to the Emergency room.   Dr. Tammi Klippel

## 2019-10-26 NOTE — Assessment & Plan Note (Signed)
Patient with worsening headache.  Some concern for migraine as patient is having nausea and vomiting associated with this.  It is unilateral as well.  Will give Rx for sumatriptan.  There is likely a musculoskeletal component as well leading to a tension type headache.  Advised to use ibuprofen as needed.  This has been going on for 2 weeks so less concern for acute hemorrhage versus meningitis.  Will not obtain LP at this time.  Unable to get neuro exam as patient is through video visit.  Was in no apparent distress and no focal deficits noted on video visit.  Patient does have a history of poor compliance on HIV medications. Last CD4 count from May 2020 of 70.  This puts her at an increased risk of possibly AIDS related diseases such as toxo.  Given this concern we will obtain an MRI with and without contrast to rule out intracranial pathology versus infection.  Strict return precautions given.  I advised that she follow-up in 2 weeks, I did not have an appointment time so I have scheduled her with Dr. Posey Pronto.  PCP was also unavailable.  Strict return precautions given.  Follow-up in 2 weeks.

## 2019-10-26 NOTE — Progress Notes (Signed)
[place] Telemedicine Visit I connected with  Kelsey Rodriguez on 10/26/19 by a video enabled telemedicine application and verified that I am speaking with the correct person using two identifiers.   I discussed the limitations of evaluation and management by telemedicine. The patient expressed understanding and agreed to proceed.  Patient consented to have virtual visit. Method of visit: Video  Encounter participants: Patient: Kelsey Rodriguez - located at Home Provider: Caroline More - located at [facility] Others (if applicable): None  Chief Complaint: Headache  HPI: Headache Patient reports back pain x2 weeks. Has been to ED twice. For the past week she has been having severe headaches as well on one side of the head. Reports subjective fevers and chills. No cough or sore throat. Denies body aches. Does endorse nausea with headache. Had 1 episode of emesis as well. Headaches are "squeezing" in pain. Some dizziness associated. Headache is constant but varies in severity, worse in evening. Wakes her up in her sleep, I clarified this with patient and she said it wakes her up in her sleep not that she wakes up with a headache. Pain seems to radiate to her lower back. Reports photo and phonophobia. Took oxycodone and did not help her headache. Tylenol 1000mg  helps some. Has had headaches in the past and reports CT scan at that time was negative. Feels like walking makes it worse.   ROS: per HPI  Pertinent PMHx: HIV (poor compliance)  Exam:  Respiratory: speaking full sentences, no increased WOB, NAD  Assessment/Plan:  Headache Patient with worsening headache.  Some concern for migraine as patient is having nausea and vomiting associated with this.  It is unilateral as well.  Will give Rx for sumatriptan.  There is likely a musculoskeletal component as well leading to a tension type headache.  Advised to use ibuprofen as needed.  This has been going on for 2 weeks so less  concern for acute hemorrhage versus meningitis.  Will not obtain LP at this time.  Unable to get neuro exam as patient is through video visit.  Was in no apparent distress and no focal deficits noted on video visit.  Patient does have a history of poor compliance on HIV medications. Last CD4 count from May 2020 of 70.  This puts her at an increased risk of possibly AIDS related diseases such as toxo.  Given this concern we will obtain an MRI with and without contrast to rule out intracranial pathology versus infection.  Strict return precautions given.  I advised that she follow-up in 2 weeks, I did not have an appointment time so I have scheduled her with Dr. Posey Pronto.  PCP was also unavailable.  Strict return precautions given.  Follow-up in 2 weeks.    Time spent during visit with patient: 25 minutes

## 2019-10-26 NOTE — Telephone Encounter (Signed)
Called patient and informed her of MRI 10/31/2019 at 1700 hours. Showtime 1645.  [place].  Ozella Almond, [place]

## 2019-10-27 ENCOUNTER — Encounter: Payer: Self-pay | Admitting: Family Medicine

## 2019-10-28 ENCOUNTER — Inpatient Hospital Stay (HOSPITAL_COMMUNITY)
Admission: EM | Admit: 2019-10-28 | Discharge: 2019-11-09 | DRG: 974 | Disposition: A | Discharge status: Home / Self Care | Payer: Medicaid Other | Attending: Family Medicine | Admitting: Family Medicine

## 2019-10-28 ENCOUNTER — Emergency Department (HOSPITAL_COMMUNITY): Payer: Medicaid Other

## 2019-10-28 ENCOUNTER — Other Ambulatory Visit: Payer: Self-pay

## 2019-10-28 ENCOUNTER — Telehealth: Payer: Self-pay | Admitting: Family Medicine

## 2019-10-28 DIAGNOSIS — Z79899 Other long term (current) drug therapy: Secondary | ICD-10-CM

## 2019-10-28 DIAGNOSIS — Z8611 Personal history of tuberculosis: Secondary | ICD-10-CM

## 2019-10-28 DIAGNOSIS — G936 Cerebral edema: Secondary | ICD-10-CM | POA: Diagnosis not present

## 2019-10-28 DIAGNOSIS — Y92230 Patient room in hospital as the place of occurrence of the external cause: Secondary | ICD-10-CM | POA: Diagnosis not present

## 2019-10-28 DIAGNOSIS — Z791 Long term (current) use of non-steroidal anti-inflammatories (NSAID): Secondary | ICD-10-CM | POA: Diagnosis not present

## 2019-10-28 DIAGNOSIS — R519 Headache, unspecified: Secondary | ICD-10-CM

## 2019-10-28 DIAGNOSIS — Z885 Allergy status to narcotic agent status: Secondary | ICD-10-CM | POA: Diagnosis not present

## 2019-10-28 DIAGNOSIS — R9089 Other abnormal findings on diagnostic imaging of central nervous system: Secondary | ICD-10-CM

## 2019-10-28 DIAGNOSIS — B3781 Candidal esophagitis: Secondary | ICD-10-CM | POA: Diagnosis not present

## 2019-10-28 DIAGNOSIS — B589 Toxoplasmosis, unspecified: Secondary | ICD-10-CM | POA: Diagnosis not present

## 2019-10-28 DIAGNOSIS — Z20828 Contact with and (suspected) exposure to other viral communicable diseases: Secondary | ICD-10-CM | POA: Diagnosis not present

## 2019-10-28 DIAGNOSIS — T368X5A Adverse effect of other systemic antibiotics, initial encounter: Secondary | ICD-10-CM | POA: Diagnosis not present

## 2019-10-28 DIAGNOSIS — Z9114 Patient's other noncompliance with medication regimen: Secondary | ICD-10-CM | POA: Diagnosis not present

## 2019-10-28 DIAGNOSIS — G939 Disorder of brain, unspecified: Secondary | ICD-10-CM | POA: Diagnosis not present

## 2019-10-28 DIAGNOSIS — R112 Nausea with vomiting, unspecified: Secondary | ICD-10-CM | POA: Diagnosis not present

## 2019-10-28 DIAGNOSIS — E271 Primary adrenocortical insufficiency: Secondary | ICD-10-CM | POA: Diagnosis not present

## 2019-10-28 DIAGNOSIS — R110 Nausea: Secondary | ICD-10-CM | POA: Diagnosis not present

## 2019-10-28 DIAGNOSIS — G9389 Other specified disorders of brain: Secondary | ICD-10-CM | POA: Diagnosis not present

## 2019-10-28 DIAGNOSIS — B582 Toxoplasma meningoencephalitis: Secondary | ICD-10-CM | POA: Diagnosis not present

## 2019-10-28 DIAGNOSIS — Z9119 Patient's noncompliance with other medical treatment and regimen: Secondary | ICD-10-CM | POA: Diagnosis not present

## 2019-10-28 DIAGNOSIS — B370 Candidal stomatitis: Secondary | ICD-10-CM | POA: Diagnosis not present

## 2019-10-28 DIAGNOSIS — B20 Human immunodeficiency virus [HIV] disease: Secondary | ICD-10-CM | POA: Diagnosis not present

## 2019-10-28 DIAGNOSIS — Z21 Asymptomatic human immunodeficiency virus [HIV] infection status: Secondary | ICD-10-CM | POA: Diagnosis not present

## 2019-10-28 HISTORY — DX: Cerebral edema: G93.6

## 2019-10-28 LAB — URINALYSIS, ROUTINE W REFLEX MICROSCOPIC
Bilirubin Urine: NEGATIVE
Glucose, UA: NEGATIVE mg/dL
Hgb urine dipstick: NEGATIVE
Ketones, ur: NEGATIVE mg/dL
Leukocytes,Ua: NEGATIVE
Nitrite: NEGATIVE
Protein, ur: NEGATIVE mg/dL
Specific Gravity, Urine: 1.016 (ref 1.005–1.030)
pH: 7 (ref 5.0–8.0)

## 2019-10-28 LAB — COMPREHENSIVE METABOLIC PANEL
ALT: 15 U/L (ref 0–44)
AST: 22 U/L (ref 15–41)
Albumin: 4.1 g/dL (ref 3.5–5.0)
Alkaline Phosphatase: 86 U/L (ref 38–126)
Anion gap: 11 (ref 5–15)
BUN: 6 mg/dL (ref 6–20)
CO2: 23 mmol/L (ref 22–32)
Calcium: 9.4 mg/dL (ref 8.9–10.3)
Chloride: 102 mmol/L (ref 98–111)
Creatinine, Ser: 0.69 mg/dL (ref 0.44–1.00)
GFR calc Af Amer: >60 mL/min (ref >60)
GFR calc non Af Amer: >60 mL/min (ref >60)
Glucose, Bld: 90 mg/dL (ref 70–99)
Potassium: 3.8 mmol/L (ref 3.5–5.1)
Sodium: 136 mmol/L (ref 135–145)
Total Bilirubin: 0.5 mg/dL (ref 0.3–1.2)
Total Protein: 8.4 g/dL — ABNORMAL HIGH (ref 6.5–8.1)

## 2019-10-28 LAB — CBC WITH DIFFERENTIAL/PLATELET
Abs Immature Granulocytes: 0.01 10*3/uL (ref 0.00–0.07)
Basophils Absolute: 0 10*3/uL (ref 0.0–0.1)
Basophils Relative: 1 %
Eosinophils Absolute: 0.2 10*3/uL (ref 0.0–0.5)
Eosinophils Relative: 6 %
HCT: 38.6 % (ref 36.0–46.0)
Hemoglobin: 13 g/dL (ref 12.0–15.0)
Immature Granulocytes: 0 %
Lymphocytes Relative: 41 %
Lymphs Abs: 1.6 10*3/uL (ref 0.7–4.0)
MCH: 32.4 pg (ref 26.0–34.0)
MCHC: 33.7 g/dL (ref 30.0–36.0)
MCV: 96.3 fL (ref 80.0–100.0)
Monocytes Absolute: 0.5 10*3/uL (ref 0.1–1.0)
Monocytes Relative: 13 %
Neutro Abs: 1.5 10*3/uL — ABNORMAL LOW (ref 1.7–7.7)
Neutrophils Relative %: 39 %
Platelets: 186 10*3/uL (ref 150–400)
RBC: 4.01 MIL/uL (ref 3.87–5.11)
RDW: 13 % (ref 11.5–15.5)
WBC: 3.8 10*3/uL — ABNORMAL LOW (ref 4.0–10.5)
nRBC: 0 % (ref 0.0–0.2)

## 2019-10-28 LAB — PROTIME-INR
INR: 1 (ref 0.8–1.2)
Prothrombin Time: 13.1 seconds (ref 11.4–15.2)

## 2019-10-28 LAB — I-STAT BETA HCG BLOOD, ED (MC, WL, AP ONLY): I-stat hCG, quantitative: <5 m[IU]/mL (ref <5)

## 2019-10-28 MED ORDER — ACETAMINOPHEN 500 MG PO TABS: 1000.0000 mg | ORAL_TABLET | Freq: Four times a day (QID) | ORAL | Status: DC

## 2019-10-28 MED ORDER — DEXTROSE 5 % IV SOLN
10.0000 mg/kg | Freq: Once | INTRAVENOUS | Status: AC
  Administered 2019-10-28: 830 mg via INTRAVENOUS
  Filled 2019-10-28: qty 16.6

## 2019-10-28 MED ORDER — GADOBUTROL 1 MMOL/ML IV SOLN
8.0000 mL | Freq: Once | INTRAVENOUS | Status: AC | PRN
  Administered 2019-10-28: 8 mL via INTRAVENOUS

## 2019-10-28 MED ORDER — DIPHENHYDRAMINE HCL 25 MG PO CAPS
50.0000 mg | ORAL_CAPSULE | Freq: Once | ORAL | Status: AC
  Administered 2019-10-28: 50 mg via ORAL
  Filled 2019-10-28: qty 2

## 2019-10-28 MED ORDER — DARUN-COBIC-EMTRICIT-TENOFAF 800-150-200-10 MG PO TABS
1.0000 | ORAL_TABLET | Freq: Every day | ORAL | Status: DC
  Administered 2019-10-29: 1 via ORAL
  Filled 2019-10-28: qty 1

## 2019-10-28 MED ORDER — ONDANSETRON HCL 4 MG PO TABS: 4.0000 mg | ORAL_TABLET | Freq: Four times a day (QID) | ORAL | Status: DC | PRN

## 2019-10-28 MED ORDER — PROCHLORPERAZINE MALEATE 5 MG PO TABS
10.0000 mg | ORAL_TABLET | Freq: Once | ORAL | Status: AC
  Administered 2019-10-28: 10 mg via ORAL
  Filled 2019-10-28: qty 2

## 2019-10-28 MED ORDER — KETOROLAC TROMETHAMINE 15 MG/ML IJ SOLN
15.0000 mg | Freq: Four times a day (QID) | INTRAMUSCULAR | Status: DC | PRN
  Administered 2019-10-29: 15 mg via INTRAVENOUS
  Filled 2019-10-28: qty 1

## 2019-10-28 MED ORDER — ENOXAPARIN SODIUM 40 MG/0.4ML ~~LOC~~ SOLN
40.0000 mg | SUBCUTANEOUS | Status: DC
  Administered 2019-10-29: 40 mg via SUBCUTANEOUS
  Filled 2019-10-28: qty 0.4

## 2019-10-28 MED ORDER — SODIUM CHLORIDE 0.9 % IV BOLUS
1000.0000 mL | Freq: Once | INTRAVENOUS | Status: AC
  Administered 2019-10-28: 1000 mL via INTRAVENOUS

## 2019-10-28 MED ORDER — SULFAMETHOXAZOLE-TRIMETHOPRIM 400-80 MG/5ML IV SOLN
15.0000 mg/kg/d | Freq: Three times a day (TID) | INTRAVENOUS | Status: DC
  Administered 2019-10-28 – 2019-11-03 (×18): 336.96 mg via INTRAVENOUS
  Filled 2019-10-28 (×19): qty 21.06

## 2019-10-28 MED ORDER — SODIUM CHLORIDE 0.9 % IV SOLN
INTRAVENOUS | Status: DC
  Administered 2019-10-29 – 2019-10-31 (×4): via INTRAVENOUS

## 2019-10-28 MED ORDER — ONDANSETRON HCL 4 MG/2ML IJ SOLN
4.0000 mg | Freq: Four times a day (QID) | INTRAMUSCULAR | Status: DC | PRN
  Administered 2019-10-29 – 2019-10-30 (×2): 4 mg via INTRAVENOUS
  Filled 2019-10-28 (×2): qty 2

## 2019-10-28 NOTE — ED Notes (Signed)
Resident paged to RN per his request

## 2019-10-28 NOTE — ED Notes (Signed)
Bed ready, floor wants to be sure the pt does not have TB, or meningitis because there is no neg pressure room available.

## 2019-10-28 NOTE — ED Provider Notes (Signed)
Medical screening examination/treatment/procedure(s) were conducted as a shared visit with non-physician practitioner(s) and myself.  I personally evaluated the patient during the encounter.  Patient with HIV and low prior CD4 count presents with headache.  I consented the patient for LP and was awaiting head CT when the results came back.  Patient has a temporal lobe area with surrounding edema and some midline shift.  Patient is awake, alert, protecting her airway with no neuro deficits.  Given the clinical setting I am more concerned with possible toxoplasmosis versus lymphoma.  At this time, I feel that LP would not be safe given midline shift on CT and concern for possible herniation.   Spoke with ID on call, Dr. Linus Salmons, who agrees with going ahead to start the Sulfadiazine treatment. Will add acyclovir as well. Discussed with ED pharmacy regarding ordering the Sulfadiazine and they will f/u and order as this is not available for me to order.   Discussed patient's case with Family Medicine to request admission. Patient and family (if present) updated with plan. Care transferred to [facility] Medicine service.  I reviewed all nursing notes, vitals, pertinent old records, EKGs, labs, imaging (as available).  CRITICAL CARE Performed by: Margette Fast Total critical care time: 35 minutes Critical care time was exclusive of separately billable procedures and treating other patients. Critical care was necessary to treat or prevent imminent or life-threatening deterioration. Critical care was time spent personally by me on the following activities: development of treatment plan with patient and/or surrogate as well as nursing, discussions with consultants, evaluation of patient's response to treatment, examination of patient, obtaining history from patient or surrogate, ordering and performing treatments and interventions, ordering and review of laboratory studies, ordering and review of radiographic  studies, pulse oximetry and re-evaluation of patient's condition.  Nanda Quinton, MD Emergency Medicine    Nanda Quinton, MD Emergency Medicine    , [facility], MD 10/28/19 [id number]

## 2019-10-28 NOTE — ED Triage Notes (Signed)
Pt with headache and back pain that has been continuous for 2 months. When she ambulates she feels pain in her head. MRI is scheduled. A/o x4 at triage

## 2019-10-28 NOTE — ED Notes (Signed)
1 set of blood culture collected and sent to lab.

## 2019-10-28 NOTE — ED Provider Notes (Signed)
[facility] EMERGENCY DEPARTMENT Provider Note   CSN: [id number] Arrival date & time: 10/28/19  1323     History   Chief Complaint Chief Complaint  Patient presents with  . Headache  . Back Pain    HPI Kelsey Rodriguez is a 28 y.o. female with PMHx HIV who presents to the ED today complaining of gradual onset, constant, sharp, 10/10, diffuse HA x 2 weeks.  She reports being seen by her PCP and started on Imitrex as they thought it was likely migraine headaches.  She states that this has not helped at all.  She has an MRI scheduled on 11/16 but states that the headaches have been so severe that she called her PCP today and was told to come to the ED for further evaluation.  Patient does have a history of HIV and unfortunately has been noncompliant in the past, most recent CD4 count in May at 67.  States she is currently taking Symtuza reports that she has missed a couple of doses here and there.  Does report subjective fever and chills for the past couple days although states that her temperature when she checks it is around 99.  99.8 temperature in the ED currently.  Does report that she has had photophobia as well.  Denies blurry vision, double vision, nausea, vomiting, unilateral weakness or numbness, confusion, any other associated symptoms.       Past Medical History:  Diagnosis Date  . 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 Adrenal insufficiency ([place])   . Anemia of chronic disease 03/11/2012  . Back pain of lumbar region with sciatica 02/12/2015  . Bell's palsy 08/26/2013  . 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 Chronic back pain   . Chronic leg pain    bilateral knees, ankles  . Fatigue   . 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 HIV (human immunodeficiency virus infection) ([place]) 02/2012  . Laceration of ankle, right 11/18/2012  . Lumbar radiculopathy   . Meningitis 02/18/2018  . Pelvic pain   . PID (acute pelvic inflammatory disease) 02/26/2018  . Reflux esophagitis  03/11/2012  . Tuberculosis   . 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 Vertigo     Patient Active Problem List   Diagnosis Date Noted  . Pruritus 08/29/2019  . Tendinopathy of left shoulder 01/18/2019  . Chronic pelvic pain in female 01/04/2019  . Lower abdominal pain 06/21/2018  . Syphilis 02/26/2018  . Tuberculosis   . Infertility, female 02/12/2018  . 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 Acute right-sided low back pain with right-sided sciatica 08/24/2017  . Complex regional pain syndrome 02/03/2017  . Headache 10/28/2016  . Avascular necrosis of bone of right hip ([place]) 04/04/2016  . 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 Avascular necrosis of bone of left hip ([place]) 12/14/2015  . 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 Primary adrenal insufficiency ([place]) 01/03/2015  . HIV (human immunodeficiency virus infection) ([place]) 03/16/2012  . 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 APPENDECTOMY  ~ 2000  . DILATION AND CURETTAGE OF UTERUS  2008  . ESOPHAGOGASTRODUODENOSCOPY  03/11/2012   Procedure: ESOPHAGOGASTRODUODENOSCOPY (EGD);  Surgeon: Lafayette Dragon, MD;  Location: [facility] ENDOSCOPY;  Service: Endoscopy;  Laterality: N/A;  . ESOPHAGOGASTRODUODENOSCOPY N/A 03/07/2014   Procedure: ESOPHAGOGASTRODUODENOSCOPY (EGD);  Surgeon: Gatha Mayer, MD;  Location: [facility] ENDOSCOPY;  Service: Endoscopy;  Laterality:  N/A;  . LUNG BIOPSY  02/2012  . TOTAL HIP ARTHROPLASTY Left 12/14/2015   Procedure: LEFT TOTAL HIP ARTHROPLASTY ANTERIOR APPROACH;  Surgeon: Mcarthur Rossetti, MD;  Location: [facility] ORS;  Service: Orthopedics;  Laterality: Left;  . TOTAL HIP ARTHROPLASTY Right 04/04/2016   Procedure: RIGHT TOTAL HIP ARTHROPLASTY ANTERIOR APPROACH;  Surgeon: Mcarthur Rossetti, MD;  Location: [facility] ORS;  Service: Orthopedics;  Laterality: Right;     OB History    Gravida  1    Para  0   Term  0   Preterm  0   AB  1   Living  0     SAB  1   TAB  0   Ectopic  0   Multiple  0   Live Births               Home Medications    Prior to Admission medications   Medication Sig Start Date End Date Taking? Authorizing Provider  acetaminophen-codeine (TYLENOL #3) 300-30 MG tablet Take 1-2 tablets by mouth every 8 (eight) hours as needed for moderate pain. 09/12/19   Mcarthur Rossetti, MD  Darunavir-Cobicisctat-Emtricitabine-Tenofovir Alafenamide [facility]) 800-150-200-10 MG TABS  01/28/19   [provider]  diazepam (VALIUM) 5 MG tablet Take 1 tablet (5 mg total) by mouth every 8 (eight) hours as needed for muscle spasms. 10/01/19   Lawyer, Harrell Gave, PA-C  gabapentin (NEURONTIN) 100 MG capsule Take 1 capsule (100 mg total) by mouth 3 (three) times daily. 07/13/18   Nuala Alpha, DO  meloxicam (MOBIC) 15 MG tablet TAKE 1 TABLET BY MOUTH DAILY FOR 14 DAYS 01/18/19   [provider]  methylPREDNISolone (MEDROL) 4 MG tablet Medrol dose pack. Take as instructed 09/12/19   Mcarthur Rossetti, MD  norethindrone (AYGESTIN) 5 MG tablet Take 5 mg by mouth daily. 03/24/19 03/23/20  [provider]  omeprazole (PRILOSEC) 20 MG capsule Take 1 capsule (20 mg total) by mouth 2 (two) times daily before a meal. 09/02/18   Domenic Moras, PA-C  omeprazole (PRILOSEC) 20 MG capsule Take by mouth. 09/02/18   [provider]  oxyCODONE-acetaminophen (PERCOCET/ROXICET) 5-325 MG tablet Take 1 tablet by mouth every 6 (six) hours as needed for severe pain. 10/01/19   Lawyer, Harrell Gave, PA-C  Polyethylene Glycol 3350 (PEG 3350) POWD Take by mouth. 02/07/19   [provider]  polyethylene glycol powder (GLYCOLAX/MIRALAX) powder Take 17 g by mouth 2 (two) times daily as needed for mild constipation. 02/07/19   Everrett Coombe, MD  predniSONE (DELTASONE) 50 MG tablet Take 1 tablet (50 mg total) by mouth daily with breakfast. 10/01/19    Lawyer, Harrell Gave, PA-C  senna-docusate (SENOKOT-S) 8.6-50 MG tablet Take 1 tablet by mouth 2 (two) times daily. 06/14/18   Lockamy, Timothy, DO  senna-docusate (SENOKOT-S) 8.6-50 MG tablet Take by mouth. 06/14/18   [provider]  sulfamethoxazole-trimethoprim (BACTRIM) 400-80 MG tablet Take 1 tablet by mouth daily. 05/16/19   Carlyle Basques, MD  SUMAtriptan (IMITREX) 50 MG tablet Take 1 tablet (50 mg total) by mouth every 2 (two) hours as needed for migraine. May repeat in 2 hours if headache persists or recurs. 07/13/18   Carlyle Basques, MD  SUMAtriptan (IMITREX) 50 MG tablet Take by mouth. 07/13/18   [provider]  SUMAtriptan (IMITREX) 50 MG tablet Take 1 tablet (50 mg total) by mouth every 2 (two) hours as needed for migraine (Maximum dose: 100 mg per dose; 200 mg per 24 hours). May repeat in 2  hours if headache persists or recurs. 10/26/19   Abraham, Sherin, DO  SYMTUZA 800-150-200-10 MG TABS TAKE 1 TABLET BY MOUTH DAILY WITH BREAKFAST. 03/21/19   Carlyle Basques, MD  SYMTUZA 800-150-200-10 MG TABS TAKE 1 TABLET BY MOUTH DAILY WITH BREAKFAST. PLEASE SCHEDULE AN APPOINTMENT FOR FURTHER FILLS 07/18/19   Carlyle Basques, MD  terbinafine (LAMISIL) 1 % cream Apply 1 application topically 2 (two) times daily. 08/29/19   Stark Klein, MD  tiZANidine (ZANAFLEX) 4 MG tablet Take 1 tablet (4 mg total) by mouth every 8 (eight) hours as needed for muscle spasms. 09/12/19   Mcarthur Rossetti, MD    Family History Family History  Problem Relation Age of Onset  . Heart disease Father        Vague not clearly cardiac    Social History Social History   Tobacco Use  . Smoking status: Never Smoker  . Smokeless tobacco: Never Used  Substance Use Topics  . Alcohol use: No    Alcohol/week: 0.0 standard drinks    Comment: socially  . Drug use: No     Allergies   Hydrocodone and Tramadol   Review of Systems Review of Systems  Constitutional: Positive for chills, fatigue  and fever (subjective).  HENT: Negative for congestion.   Eyes: Positive for photophobia. Negative for visual disturbance.  Respiratory: Negative for cough and shortness of breath.   Cardiovascular: Negative for chest pain.  Gastrointestinal: Negative for abdominal pain, nausea and vomiting.  Genitourinary: Negative for difficulty urinating.  Musculoskeletal: Negative for neck pain and neck stiffness.  Skin: Negative for rash.  Neurological: Positive for headaches.     Physical Exam Updated Vital Signs BP (!) 124/100   Pulse 92   Temp 99 F (37.2 C) (Oral)   Resp 18   LMP 09/28/2019 (Exact Date)   SpO2 100%   Physical Exam Vitals signs and nursing note reviewed.  Constitutional:      Appearance: She is not ill-appearing or diaphoretic.  HENT:     Head: Normocephalic and atraumatic.  Eyes:     Extraocular Movements: Extraocular movements intact.     Conjunctiva/sclera: Conjunctivae normal.     Pupils: Pupils are equal, round, and reactive to light.  Neck:     Musculoskeletal: Neck supple.     Meningeal: Brudzinski's sign and Kernig's sign absent.  Cardiovascular:     Rate and Rhythm: Normal rate and regular rhythm.     Heart sounds: Normal heart sounds.  Pulmonary:     Effort: Pulmonary effort is normal.     Breath sounds: Normal breath sounds. No wheezing, rhonchi or rales.  Abdominal:     Palpations: Abdomen is soft.     Tenderness: There is no abdominal tenderness.  Skin:    General: Skin is warm and dry.  Neurological:     Mental Status: She is alert and oriented to person, place, and time.     GCS: GCS eye subscore is 4. GCS verbal subscore is 5. GCS motor subscore is 6.     Comments: CN 3-12 grossly intact A&O x4 GCS 15 Sensation and strength intact Gait nonataxic including with tandem walking Coordination with finger-to-nose WNL; pt does have some dysmetria but states it is due to the light bothering her eyes Neg romberg, neg pronator drift      ED  Treatments / Results  Labs (all labs ordered are listed, but only abnormal results are displayed) Labs Reviewed  URINALYSIS, ROUTINE W REFLEX MICROSCOPIC - Abnormal; Notable  for the following components:      Result Value   APPearance HAZY (*)    All other components within normal limits  COMPREHENSIVE METABOLIC PANEL - Abnormal; Notable for the following components:   Total Protein 8.4 (*)    All other components within normal limits  CBC WITH DIFFERENTIAL/PLATELET - Abnormal; Notable for the following components:   WBC 3.8 (*)    Neutro Abs 1.5 (*)    All other components within normal limits  CULTURE, BLOOD (ROUTINE X 2)  CULTURE, BLOOD (ROUTINE X 2)  CSF CULTURE  GRAM STAIN  CULTURE, FUNGUS WITHOUT SMEAR  SARS CORONAVIRUS 2 (TAT 6-24 HRS)  PROTIME-INR  CSF CELL COUNT WITH DIFFERENTIAL  CSF CELL COUNT WITH DIFFERENTIAL  PROTEIN AND GLUCOSE, CSF  VDRL, CSF  T-HELPER CELLS (CD4) COUNT (NOT AT [facility])  CRYPTOCOCCAL ANTIGEN  I-STAT BETA HCG BLOOD, ED ([facility], [facility], [facility] ONLY)  CYTOLOGY - NON PAP    EKG None  Radiology Ct Head Wo Contrast  Result Date: 10/28/2019 CLINICAL DATA:  Headache. EXAM: CT HEAD WITHOUT CONTRAST TECHNIQUE: Contiguous axial images were obtained from the base of the skull through the vertex without intravenous contrast. COMPARISON:  CT dated 03/28/2014.  MRI head dated February 18, 2018 FINDINGS: Brain: There is a 6.6 x 4.2 cm area of vasogenic edema primarily involving the right temporal lobe. There is mild mass effect on the right lateral ventricle with a leftward midline shift measuring approximately 3-4 mm. There is no acute intracranial hemorrhage. Vascular: No hyperdense vessel or unexpected calcification. Skull: Normal. Negative for fracture or focal lesion. Sinuses/Orbits: No acute finding. Other: None. IMPRESSION: Large area of vasogenic edema primarily involving the right temporal lobe. There is mass effect and a leftward midline shift measuring approximately  3-4 mm. Follow-up with a contrast-enhanced MRI is recommended for evaluation of an underlying mass or infectious process. Electronically Signed   By: Constance Holster M.D.   On: 10/28/2019 18:22    Procedures Procedures (including critical care time)  Medications Ordered in ED Medications  sodium chloride 0.9 % bolus 1,000 mL (has no administration in time range)  acyclovir (ZOVIRAX) 830 mg in dextrose 5 % 150 mL IVPB (has no administration in time range)  prochlorperazine (COMPAZINE) tablet 10 mg (10 mg Oral Given 10/28/19 1813)  diphenhydrAMINE (BENADRYL) capsule 50 mg (50 mg Oral Given 10/28/19 1813)     Initial Impression / Assessment and Plan / ED Course  I have reviewed the triage vital signs and the nursing notes.  Pertinent labs & imaging results that were available during my care of the patient were reviewed by me and considered in my medical decision making (see chart for details).    28 year old female presents to the ED today with severe headache for the past 2 weeks, currently being seen by PCP and has MRI scheduled for 3 days from now.  Currently being treated for migraine headaches with Imitrex without relief.  Was sent to the ED today for further evaluation with concern given patient is HIV positive and noncompliant with medications.  PCP was concerned for intracranial infection including toxoplasmosis versus meningitis.  Patient's vital signs are stable today.  She does have slight increase in temperature at 99.8 although nontachycardic and nontachypneic.  Patient is fatigued on exam.  She reports photophobia.  She has no focal neuro deficits although some dysmetria with finger-to-nose she blames on the light bothering her eyes.  No meningeal signs.  Negative Kernig's and Brudzinski's.  No fulminant  rash.  Discussed case with attending physician Dr. Laverta Baltimore who recommends CT head, baseline blood work, blood cultures.  He will attempt L PE after CT head.  Will treat with IV  fluids, Compazine, Benadryl for her relief of headache.  CBC without leukocytosis. No Other electrolyte abnormalities.   CT head with mass-effect and left midline shift with edema.  Recommend MRI with contrast for further evaluation.  We will order this with and without.  Have discussed case with attending physician Dr. Laverta Baltimore who will hold off on LP at this time.  Patient will need to be admitted.   7:51 PM Case signed out to attending physician Dr. Laverta Baltimore pending MRI brain with plan for admission.       Final Clinical Impressions(s) / ED Diagnoses   Final diagnoses:  Acute intractable headache, unspecified headache type  Midline shift of brain  HIV infection, unspecified symptom status [facility])    ED Discharge Orders    None       Eustaquio Maize, PA-C 10/28/19 1952    Margette Fast, MD 10/28/19 2225

## 2019-10-28 NOTE — Progress Notes (Signed)
Pharmacy Antibiotic Note  Kelsey Rodriguez is a 28 y.o. female with HIF admitted on 10/28/2019 with headache and concern for Toxoplasmosis. Pharmacy has been consulted for Bactrim dosing.  Sulfadiazine + Pyrimethamine + Leucovorin was discussed with Dr. Laverta Baltimore however pyrimethamine requires special order and likely will not be able to be obtained until Monday. We have some sulfadiazine in stock but not enough to last through the weekend (16 tablets - pt would require 12 a day). Alternatively - will get Bactrim initiated and follow up on the ordering of the above.   Plan: - Start Bactrim 5 mg/kg TMP component (adjusted body weight) every 8 hours (total of 15 mg/kg/day) - Will follow-up on the ordering of the drugs listed above - Will continue to follow renal function, culture results, LOT, and antibiotic de-escalation plans   Height: 5\' 5"  (165.1 cm) Weight: 183 lb (83 kg) IBW/kg (Calculated) : 57  Temp (24hrs), Avg:99.4 F (37.4 C), Min:99 F (37.2 C), Max:99.8 F (37.7 C)  Recent Labs  Lab 10/28/19 1456  WBC 3.8*  CREATININE 0.69    Estimated Creatinine Clearance: 109.4 mL/min (by C-G formula based on SCr of 0.69 mg/dL).    Allergies  Allergen Reactions  . Hydrocodone Itching and Nausea Only    Tolerates Oxycodone  . Tramadol Itching and Nausea Only    Tolerates oxycodone    Thank you for allowing pharmacy to be a part of this patient's care.  Alycia Rossetti, PharmD, BCPS Clinical Pharmacist Clinical phone for 10/28/2019: [phone] 10/28/2019 9:07 PM   **Pharmacist phone directory can now be found on [email or web address] ([email or web address]).  Listed under [place].

## 2019-10-28 NOTE — ED Notes (Signed)
This RN and another RN attempted IV access X1.

## 2019-10-28 NOTE — Telephone Encounter (Signed)
Received mychart message from patient reporting severe headaches. States any movement makes pain worse. I advised she go to the ED ASAP. Given low CD4 count could be severe infection such as toxo. Could also be meningitis. Advised patient go to ED right away. Will likely need imaging. Patient will have her husband drive her there. I stressed the importance of patient going immediately to ED as waiting going worsen symptoms or lead to death.   Patient voiced understanding and stated she would go to ED ASAP  Will forward to PCP as Jamie Kato, DO, PGY-3 [facility] 10/28/2019 8:40 AM

## 2019-10-28 NOTE — H&P (Addendum)
[facility] Admission History and Physical Service Pager: [phone]  Patient name: Kelsey Rodriguez Medical record number: [id number] Date of birth: [birth year] Age: 28 y.o. Gender: female  Primary Care Provider: Gerlene Fee, DO Consultants: Infectious Disease Code Status: Full Preferred Emergency Contact: Theadore Nan, husband, at [phone] and Corine Shelter, mother, at [phone]  Chief Complaint: headache and back pain  Assessment and Plan: Kelsey Rodriguez is a 28 y.o. female presenting with headache and back pain x 2weeks . PMH is significant for HIV, meningitis, syphilis, Bell's palsy, primary adrenal insufficiency, tuberculosis, herpes simplex esophagitis.  Headache likely secondary to CNS lesion with mass effect Differentials include opportunistic infections including tuberculosis, toxoplasma, cryptococcus and brain abscess. Less likely neurocysticercosis, neurosyphillis or brain tumor.  Patient has history of HIV and has been noncompliant with medications.  Last saw infectious disease in May 2020 and was found to have a CD4 count of 70 at that time.  CT head shows large vasogenic edema in the right temporal lobe area with leftward midline shift.  MRI concerning for 2.4 x 1.9 x 2.4 cm cortically based rimmed enhancing lesion involving the right temporal lobe with associated scattered leptomeningeal and dural enhancement within the right frontotemporal region.  Associated vasogenic edema with up to 4 mm of right to left midline shift.  Given that patient is afebrile, CT head and MRI brain less likely for abscess, this is likely not the cause of patient's headache.  Most likely etiology continues to remain an opportunistic infection due to CD4 of 70, noncompliance with home medication (Symtuza and Bactrim) and MRI findings.  Can rule out cryptococcus as cryptococcal antigen negative.  TB less likely as patient has no hemoptysis, cough, fever, or night sweats and chest  x-ray negative for active pulmonary disease.  Even if patient has as tuberculoma, only pulmonary and laryngeal forms of TB are considered infectious, so she does not need airborne precautions.  CNS lymphoma could also be a possibility, however patient has no B symptoms.  HSV encephalitis also less likely given lesion seen on MRI.  Infectious disease consulted in the ED and recommended starting sulfadiazine therapy for possible toxoplasmosis as source of CNS lesion and will follow in a.m, which is the most likely diagnosis.  Unfortunately, LP was felt to be unsafe in the setting of CNS edema and midline shift.  Can also consider dexamethasone therapy due to cerebral edema, but this can complicate monitoring the response of toxoplasmosis to therapy. -Admit to progressive, attending Dr. Gwendlyn Deutscher -Vital signs per floor routine -Neuro signs every 4h -ID consulted in ED, will see in a.m. -Pharmacy consulted for sulfadiazine and bactrim therapy -follow up initiation of sulfadiazine (pharmacy has to procure this medication) -Acyclovir 830 mg IV (11/13) -Bactrim IV(11/13-) -Continue Symtuza p.o. -Toradol 15 mg IV every 6 as needed for pain -Tylenol 1 g every 6h -Zofran 4 mg every 6 hours as needed  -Covid pending -f/u CD4 count -toxoplasma gondii IgG (IgM is often negative even during acute infections) -NPO in case of a procedure such as a brain biopsy or relief of intracranial pressure on 11/14  Primary Adrenal Insuffiency Patient is no longer taking hydrocortisone or any other steroid due to her history of bilateral avascular necrosis of the hip.  She says that a study was performed after stopping these medications, and her adrenals appeared to be functioning normally.  Resolution of primary adrenal insufficiency is rare, and patient may need stress dosed steroids in light of her acute illness, so  follow up testing is needed. -A.m. cortisol and ACTH  FEN/GI:  NPO Normal saline at  188ml/hr Prophylaxis:  Lovenox  Disposition: Progressive, Dr Gwendlyn Deutscher  History of Present Illness:  Kelsey Rodriguez is a 28 y.o. female presenting with headache.  She says that it started as a right sided headache about one week ago and has now progressed to involve her entire head and neck.  She also reports photophobia and nausea.  She says she has never had this type of headache before.  She has taken tylenol and ibuprofen for her pain without relief.  She reports chills and will feel hot at night.  She has never had an elevated temperature at home.  She says that walking makes her feel weak and dizzy and will feel pain in her upper back and chest with this movement.  This started about 2-3 days ago.  She denies recent falls or head trauma.  She takes her Symtuza for her HIV but says she misses a dose every 3-4 days.  She follows with Dr. Baxter Flattery at the [name].  She says she is prescribed Bactrim but says that she stopped taking it in September and cannot say why.  She denies weight loss, night sweats, and swollen lymph nodes.  She does not take any medications other than Symtuza.  In the ED she was afebrile, vital signs stable.  She had consented 4 LP but given CT findings ED provider felt not safe at this time.  She was given Benadryl and Compazine. ID was consulted in the ED, patient started on acyclovir, sulfadiazine and Bactrim.   Review Of Systems: Per HPI with the following additions:   Review of Systems  Constitutional: Positive for chills.  HENT: Negative for congestion.   Eyes: Positive for photophobia.  Respiratory: Negative for cough and shortness of breath.   Cardiovascular: Negative for chest pain and leg swelling.  Gastrointestinal: Positive for nausea. Negative for constipation and diarrhea.  Genitourinary: Negative for dysuria.  Musculoskeletal: Positive for neck pain.  Neurological: Positive for headaches. Negative for tingling.  Psychiatric/Behavioral: Positive for memory  loss.    Patient Active Problem List   Diagnosis Date Noted  . Cerebral edema ([place]) 10/28/2019  . Midline shift of brain   . Pruritus 08/29/2019  . Tendinopathy of left shoulder 01/18/2019  . Chronic pelvic pain in female 01/04/2019  . Lower abdominal pain 06/21/2018  . Syphilis 02/26/2018  . Tuberculosis   . Infertility, female 02/12/2018  . 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 Acute right-sided low back pain with right-sided sciatica 08/24/2017  . Complex regional pain syndrome 02/03/2017  . Headache 10/28/2016  . Avascular necrosis of bone of right hip ([place]) 04/04/2016  . 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 Avascular necrosis of bone of left hip ([place]) 12/14/2015  . 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 Primary adrenal insufficiency ([place]) 01/03/2015  . HIV (human immunodeficiency virus infection) ([place]) 03/16/2012  . 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 Adrenal insufficiency ([place])   . Anemia of chronic disease 03/11/2012  . Back pain of lumbar region with sciatica 02/12/2015  . Bell's palsy 08/26/2013  . 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 Chronic back pain   . Chronic leg pain    bilateral knees, ankles  . Fatigue   . 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  HIV (human immunodeficiency virus infection) ([place]) 02/2012  . Laceration of ankle, right 11/18/2012  . Lumbar radiculopathy   . Meningitis 02/18/2018  . Pelvic pain   . PID (acute pelvic inflammatory disease) 02/26/2018  . Reflux esophagitis 03/11/2012  . Tuberculosis   . 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 Vertigo     Past Surgical History: Past Surgical History:  Procedure Laterality Date  . APPENDECTOMY  ~ 2000  . DILATION AND CURETTAGE OF UTERUS  2008  . ESOPHAGOGASTRODUODENOSCOPY  03/11/2012   Procedure:  ESOPHAGOGASTRODUODENOSCOPY (EGD);  Surgeon: Lafayette Dragon, MD;  Location: [facility] ENDOSCOPY;  Service: Endoscopy;  Laterality: N/A;  . ESOPHAGOGASTRODUODENOSCOPY N/A 03/07/2014   Procedure: ESOPHAGOGASTRODUODENOSCOPY (EGD);  Surgeon: Gatha Mayer, MD;  Location: [facility] ENDOSCOPY;  Service: Endoscopy;  Laterality: N/A;  . LUNG BIOPSY  02/2012  . TOTAL HIP ARTHROPLASTY Left 12/14/2015   Procedure: LEFT TOTAL HIP ARTHROPLASTY ANTERIOR APPROACH;  Surgeon: Mcarthur Rossetti, MD;  Location: [facility] ORS;  Service: Orthopedics;  Laterality: Left;  . TOTAL HIP ARTHROPLASTY Right 04/04/2016   Procedure: RIGHT TOTAL HIP ARTHROPLASTY ANTERIOR APPROACH;  Surgeon: Mcarthur Rossetti, MD;  Location: [facility] ORS;  Service: Orthopedics;  Laterality: Right;    Social History: Social History   Tobacco Use  . Smoking status: Never Smoker  . Smokeless tobacco: Never Used  Substance Use Topics  . Alcohol use: No    Alcohol/week: 0.0 standard drinks    Comment: socially  . Drug use: No   Additional social history: Lives with husband. Please also refer to relevant sections of EMR.  Family History: Family History  Problem Relation Age of Onset  . Heart disease Father        Vague not clearly cardiac   (If not completed, MUST add something in)  Allergies and Medications: Allergies  Allergen Reactions  . Hydrocodone Itching and Nausea Only    Tolerates Oxycodone  . Tramadol Itching and Nausea Only    Tolerates oxycodone   No current facility-administered medications on file prior to encounter.    Current Outpatient Medications on File Prior to Encounter  Medication Sig Dispense Refill  . acetaminophen (TYLENOL) 500 MG tablet Take 1,000 mg by mouth every 6 (six) hours as needed for moderate pain.    . cyclobenzaprine (FLEXERIL) 5 MG tablet Take 5 mg by mouth at bedtime.    . diazepam (VALIUM) 5 MG tablet Take 1 tablet (5 mg total) by mouth every 8 (eight) hours as needed for muscle spasms. 12 tablet 0  .  medroxyPROGESTERone (PROVERA) 10 MG tablet Take 10 mg by mouth daily.    [name] oxyCODONE-acetaminophen (PERCOCET/ROXICET) 5-325 MG tablet Take 1 tablet by mouth every 6 (six) hours as needed for severe pain. 15 tablet 0  . polyethylene glycol powder (GLYCOLAX/MIRALAX) powder Take 17 g by mouth 2 (two) times daily as needed for mild constipation. 510 g 3  . predniSONE (DELTASONE) 50 MG tablet Take 1 tablet (50 mg total) by mouth daily with breakfast. 5 tablet 0  . sulfamethoxazole-trimethoprim (BACTRIM) 400-80 MG tablet Take 1 tablet by mouth daily. 30 tablet 6  . SUMAtriptan (IMITREX) 50 MG tablet Take 1 tablet (50 mg total) by mouth every 2 (two) hours as needed for migraine (Maximum dose: 100 mg per dose; 200 mg per 24 hours). May repeat in 2 hours if headache persists or recurs. 10 tablet 0  . SYMTUZA 800-150-200-10 MG TABS TAKE 1 TABLET BY MOUTH DAILY WITH BREAKFAST. PLEASE SCHEDULE AN APPOINTMENT  FOR FURTHER FILLS (Patient taking differently: Take 1 tablet by mouth every evening. ) 30 tablet 2  . acetaminophen-codeine (TYLENOL #3) 300-30 MG tablet Take 1-2 tablets by mouth every 8 (eight) hours as needed for moderate pain. (Patient not taking: Reported on 10/28/2019) 40 tablet 0  . gabapentin (NEURONTIN) 100 MG capsule Take 1 capsule (100 mg total) by mouth 3 (three) times daily. (Patient not taking: Reported on 10/28/2019) 90 capsule 3  . methylPREDNISolone (MEDROL) 4 MG tablet Medrol dose pack. Take as instructed (Patient not taking: Reported on 10/28/2019) 21 tablet 0  . omeprazole (PRILOSEC) 20 MG capsule Take 1 capsule (20 mg total) by mouth 2 (two) times daily before a meal. (Patient not taking: Reported on 10/28/2019) 30 capsule 0  . senna-docusate (SENOKOT-S) 8.6-50 MG tablet Take 1 tablet by mouth 2 (two) times daily. (Patient not taking: Reported on 10/28/2019) 60 tablet 1  . SUMAtriptan (IMITREX) 50 MG tablet Take 1 tablet (50 mg total) by mouth every 2 (two) hours as needed for migraine. May  repeat in 2 hours if headache persists or recurs. (Patient not taking: Reported on 10/28/2019) 10 tablet 0  . SYMTUZA 800-150-200-10 MG TABS TAKE 1 TABLET BY MOUTH DAILY WITH BREAKFAST. (Patient not taking: Reported on 10/28/2019) 30 tablet 1  . terbinafine (LAMISIL) 1 % cream Apply 1 application topically 2 (two) times daily. (Patient not taking: Reported on 10/28/2019) 30 g 0  . tiZANidine (ZANAFLEX) 4 MG tablet Take 1 tablet (4 mg total) by mouth every 8 (eight) hours as needed for muscle spasms. (Patient not taking: Reported on 10/28/2019) 30 tablet 0    Objective: BP 117/84   Pulse 79   Temp 99.8 F (37.7 C) (Oral)   Resp 16   Ht 5\' 5"  (1.651 m)   Wt 83 kg   SpO2 98%   BMI 30.45 kg/m  Exam: General: 28 year old female, in no acute distress Eyes: Pupils are equal and reactive to light.  EOMs intact, no nystagmus observed ENTM: Mucous membranes moist, white plaques noted posterior pharyngeal wall, nontender no dysphagia. Neck: Pain on flexion, no lymphadenopathy Cardiovascular: Regular rate and rhythm, no murmurs appreciated Respiratory: Chest clear to auscultation bilaterally, no crackles, no rhonchi, no increased work of breathing Gastrointestinal: Soft, nontender, nondistended, no organomegaly, bowel sounds present MSK: Pain on hip flexion likely secondary to bilateral hip replacement.  No lower extremity edema noted Derm: Warm and dry Neuro: Alert and orientated x4, unable to observe gait, finger-nose test positive for hypometria Psych: Pleasant and cooperative, answers all questions appropriately and obeys commands  Labs and Imaging: CBC BMET  Recent Labs  Lab 10/28/19 1456  WBC 3.8*  HGB 13.0  HCT 38.6  PLT 186   Recent Labs  Lab 10/28/19 1456  NA 136  K 3.8  CL 102  CO2 23  BUN 6  CREATININE 0.69  GLUCOSE 90  CALCIUM 9.4     EKG:    Dg Chest 2 View  Result Date: 10/29/2019 CLINICAL DATA:  HIV EXAM: CHEST - 2 VIEW COMPARISON:  05/25/2018 FINDINGS:  Heart is borderline in size. Low lung volumes. No confluent opacities or effusions. No acute bony abnormality. IMPRESSION: No active cardiopulmonary disease. Electronically Signed   By: Rolm Baptise M.D.   On: 10/29/2019 00:46   Ct Head Wo Contrast  Result Date: 10/28/2019 CLINICAL DATA:  Headache. EXAM: CT HEAD WITHOUT CONTRAST TECHNIQUE: Contiguous axial images were obtained from the base of the skull through the vertex without intravenous contrast.  COMPARISON:  CT dated 03/28/2014.  MRI head dated February 18, 2018 FINDINGS: Brain: There is a 6.6 x 4.2 cm area of vasogenic edema primarily involving the right temporal lobe. There is mild mass effect on the right lateral ventricle with a leftward midline shift measuring approximately 3-4 mm. There is no acute intracranial hemorrhage. Vascular: No hyperdense vessel or unexpected calcification. Skull: Normal. Negative for fracture or focal lesion. Sinuses/Orbits: No acute finding. Other: None. IMPRESSION: Large area of vasogenic edema primarily involving the right temporal lobe. There is mass effect and a leftward midline shift measuring approximately 3-4 mm. Follow-up with a contrast-enhanced MRI is recommended for evaluation of an underlying mass or infectious process. Electronically Signed   By: Constance Holster M.D.   On: 10/28/2019 18:22   Mr Brain W And Wo Contrast  Result Date: 10/28/2019 CLINICAL DATA:  Initial evaluation for acute severe headache. History of HIV. EXAM: MRI HEAD WITHOUT AND WITH CONTRAST TECHNIQUE: Multiplanar, multiecho pulse sequences of the brain and surrounding structures were obtained without and with intravenous contrast. CONTRAST:  54mL GADAVIST GADOBUTROL 1 MMOL/ML IV SOLN COMPARISON:  Comparison made with prior CT from earlier the same day as well as previous brain MRIs, most recent of which from 02/18/2018. FINDINGS: Brain: Mildly advanced cerebral atrophy for age, similar to 2019. Patchy T2/FLAIR signal abnormality involving  the right periatrial white matter is relatively stable from previous. Few additional scattered nonspecific subcentimeter foci of T2/FLAIR signal intensity also relatively unchanged. No abnormal foci of restricted diffusion to suggest acute or subacute ischemia. No encephalomalacia to suggest chronic infarction. No acute intracranial hemorrhage. No extra-axial fluid collection. Irregular cortically based rim enhancing lesion positioned at the anterior-mid right temporal lobe measures 2.4 x 1.9 x 2.4 cm (series 18, image 28), new from previous MRI, corresponding with abnormality seen on prior CT from earlier today. Lesion demonstrates heterogeneous hypointense internal T2 signal intensity with a thick peripheral rind of irregular enhancement. Faint susceptibility artifact suggestive of blood products and/or necrosis. No central restricted diffusion to suggest frank abscess. Associated extensive vasogenic edema throughout the adjacent right temporal lobe with 4 mm of right-to-left midline shift. No hydrocephalus or ventricular trapping. Associated scattered leptomeningeal and dural enhancement within the adjacent right frontotemporal region. No intraventricular debris or ependymal enhancement. No other mass lesion or abnormal enhancement elsewhere within the brain. Vascular: Major intracranial vascular flow voids are well maintained. Skull and upper cervical spine: Craniocervical junction within normal limits. Visualized upper cervical spine normal. Bone marrow signal intensity within normal limits. Scalp soft tissues within normal limits. Sinuses/Orbits: Globes and orbital soft tissues demonstrate no acute finding. Mild scattered mucosal thickening throughout the ethmoidal air cells. Paranasal sinuses are otherwise clear. Trace left mastoid effusion. Inner ear structures normal. Other: None. IMPRESSION: 1. 2.4 x 1.9 x 2.4 cm cortically based rim enhancing lesion involving the right temporal lobe with associated  scattered leptomeningeal and dural enhancement within the adjacent right frontotemporal region. Associated vasogenic edema with up to 4 mm of right-to-left midline shift. Given the history of HIV, primary differential consideration consists of intracranial infection with meningitis and localized cerebritis, including opportunistic infections such as TB or toxoplasmosis. No frank restricted diffusion to suggest intracerebral abscess. Correlation with CSF fluid analysis recommended. Sequelae of IRIS could also be considered in the correct clinical setting. High-grade glioma could also have this appearance, although is felt to be less likely and should only be considered if alternate etiologies are excluded. 2. Underlying mildly advanced generalized cerebral volume loss  for age, which could reflect the sequelae of HIV encephalitis. Electronically Signed   By: Jeannine Boga M.D.   On: 10/28/2019 21:39    Carollee Leitz, MD 10/29/2019, 12:52 AM PGY-1, [place] [facility] pager: [phone], text pages welcome  FPTS Upper-Level Resident Addendum   I have independently interviewed and examined the patient. I have discussed the above with the original author and agree with their documentation. My edits for correction/addition/clarification are in blue. Please see also any attending notes.    Kathrene Alu, MD PGY-3, [place] [facility] 10/29/2019 1:21 AM  FPTS Service pager: [phone] (text pages welcome through [facility])

## 2019-10-29 ENCOUNTER — Inpatient Hospital Stay (HOSPITAL_COMMUNITY): Payer: Medicaid Other

## 2019-10-29 ENCOUNTER — Telehealth: Payer: Self-pay | Admitting: Family Medicine

## 2019-10-29 DIAGNOSIS — Z885 Allergy status to narcotic agent status: Secondary | ICD-10-CM

## 2019-10-29 DIAGNOSIS — B20 Human immunodeficiency virus [HIV] disease: Principal | ICD-10-CM

## 2019-10-29 DIAGNOSIS — Z9119 Patient's noncompliance with other medical treatment and regimen: Secondary | ICD-10-CM

## 2019-10-29 DIAGNOSIS — B589 Toxoplasmosis, unspecified: Secondary | ICD-10-CM

## 2019-10-29 LAB — CBC
HCT: 37.3 % (ref 36.0–46.0)
Hemoglobin: 12.6 g/dL (ref 12.0–15.0)
MCH: 32.3 pg (ref 26.0–34.0)
MCHC: 33.8 g/dL (ref 30.0–36.0)
MCV: 95.6 fL (ref 80.0–100.0)
Platelets: 179 10*3/uL (ref 150–400)
RBC: 3.9 MIL/uL (ref 3.87–5.11)
RDW: 12.9 % (ref 11.5–15.5)
WBC: 3.3 10*3/uL — ABNORMAL LOW (ref 4.0–10.5)
nRBC: 0 % (ref 0.0–0.2)

## 2019-10-29 LAB — COMPREHENSIVE METABOLIC PANEL
ALT: 15 U/L (ref 0–44)
AST: 17 U/L (ref 15–41)
Albumin: 3.4 g/dL — ABNORMAL LOW (ref 3.5–5.0)
Alkaline Phosphatase: 78 U/L (ref 38–126)
Anion gap: 11 (ref 5–15)
BUN: <5 mg/dL — ABNORMAL LOW (ref 6–20)
CO2: 21 mmol/L — ABNORMAL LOW (ref 22–32)
Calcium: 8.6 mg/dL — ABNORMAL LOW (ref 8.9–10.3)
Chloride: 105 mmol/L (ref 98–111)
Creatinine, Ser: 0.66 mg/dL (ref 0.44–1.00)
GFR calc Af Amer: >60 mL/min (ref >60)
GFR calc non Af Amer: >60 mL/min (ref >60)
Glucose, Bld: 90 mg/dL (ref 70–99)
Potassium: 3.7 mmol/L (ref 3.5–5.1)
Sodium: 137 mmol/L (ref 135–145)
Total Bilirubin: 0.2 mg/dL — ABNORMAL LOW (ref 0.3–1.2)
Total Protein: 7.2 g/dL (ref 6.5–8.1)

## 2019-10-29 LAB — CRYPTOCOCCAL ANTIGEN: Crypto Ag: NEGATIVE

## 2019-10-29 LAB — CORTISOL: Cortisol, Plasma: 11.7 ug/dL

## 2019-10-29 LAB — MRSA PCR SCREENING: MRSA by PCR: NEGATIVE

## 2019-10-29 LAB — SARS CORONAVIRUS 2 (TAT 6-24 HRS): SARS Coronavirus 2: NEGATIVE

## 2019-10-29 MED ORDER — ACETAMINOPHEN 500 MG PO TABS
1000.0000 mg | ORAL_TABLET | Freq: Four times a day (QID) | ORAL | Status: DC | PRN
  Administered 2019-10-29: 1000 mg via ORAL
  Filled 2019-10-29: qty 2

## 2019-10-29 MED ORDER — ENOXAPARIN SODIUM 40 MG/0.4ML ~~LOC~~ SOLN
40.0000 mg | SUBCUTANEOUS | Status: DC
  Administered 2019-10-30 – 2019-11-09 (×11): 40 mg via SUBCUTANEOUS
  Filled 2019-10-29 (×12): qty 0.4

## 2019-10-29 MED ORDER — ACETAMINOPHEN 325 MG PO TABS
650.0000 mg | ORAL_TABLET | Freq: Four times a day (QID) | ORAL | Status: DC | PRN
  Administered 2019-10-31: 650 mg via ORAL
  Filled 2019-10-29 (×2): qty 2

## 2019-10-29 NOTE — Progress Notes (Signed)
Family medicine progress update  Discussed case with Dr. Lorraine Lax, on-call for neurology.  After reviewing the scan he felt that she was not appropriate candidate for a lumbar puncture.  He recommended that I talk to neurosurgery to discuss possibility of a brain biopsy.  Discussed the case with Dr. Arnoldo Morale on-call for neurosurgery.  He felt the patient was not an appropriate candidate for a surgical procedure to get a brain biopsy.  He recommended to treat empirically for toxoplasmosis, and if her situation does not improve and the lesion does not look better on imaging then he would consider doing a surgical procedure.  I appreciate both neurology and neurosurgery's recs.  Neurosurgery consult note to follow.  Guadalupe [name] MD PGY-3 Family Medicine Resident

## 2019-10-29 NOTE — ED Notes (Signed)
Patient transported to X-ray 

## 2019-10-29 NOTE — ED Notes (Signed)
Contacted attending, per attending, there is no need for the pt to be on and isolations percautions.

## 2019-10-29 NOTE — Consult Note (Signed)
Reason for Consult: Right temporal brain lesion Referring Physician: Dr. Jari Pigg Bih Kelsey Rodriguez is an 28 y.o. female.  HPI: The patient is a 28 year old black female immigrant from [place] with a diagnosis of HIV/AIDS.  She was admitted with headaches.  Head CT demonstrated a right temporal lesion with mild temporal edema.  This was followed by brain MRI which demonstrated similar findings.  ID was consulted and recommended a high-volume lumbar puncture.  Dr. Lorraine Lax the neurology is reluctant to do the LP for fear of herniation.  A neurosurgical consultation was requested.  Presently the patient is alert and pleasant.  She has a mild headache.  Past Medical History:  Diagnosis Date  . 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 Adrenal insufficiency ([place])   . Anemia of chronic disease 03/11/2012  . Back pain of lumbar region with sciatica 02/12/2015  . Bell's palsy 08/26/2013  . 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 Chronic back pain   . Chronic leg pain    bilateral knees, ankles  . Fatigue   . 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 HIV (human immunodeficiency virus infection) ([place]) 02/2012  . Laceration of ankle, right 11/18/2012  . Lumbar radiculopathy   . Meningitis 02/18/2018  . Pelvic pain   . PID (acute pelvic inflammatory disease) 02/26/2018  . Reflux esophagitis 03/11/2012  . Tuberculosis   . 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 Vertigo     Past Surgical History:  Procedure Laterality Date  . APPENDECTOMY  ~ 2000  . DILATION AND CURETTAGE OF UTERUS  2008  . ESOPHAGOGASTRODUODENOSCOPY  03/11/2012   Procedure: ESOPHAGOGASTRODUODENOSCOPY (EGD);  Surgeon: Lafayette Dragon, MD;  Location: [facility] ENDOSCOPY;  Service: Endoscopy;  Laterality: N/A;  . ESOPHAGOGASTRODUODENOSCOPY N/A 03/07/2014   Procedure: ESOPHAGOGASTRODUODENOSCOPY (EGD);  Surgeon: Gatha Mayer, MD;  Location: [facility] ENDOSCOPY;  Service: Endoscopy;  Laterality: N/A;  . LUNG BIOPSY  02/2012  . TOTAL HIP ARTHROPLASTY Left  12/14/2015   Procedure: LEFT TOTAL HIP ARTHROPLASTY ANTERIOR APPROACH;  Surgeon: Mcarthur Rossetti, MD;  Location: [facility] ORS;  Service: Orthopedics;  Laterality: Left;  . TOTAL HIP ARTHROPLASTY Right 04/04/2016   Procedure: RIGHT TOTAL HIP ARTHROPLASTY ANTERIOR APPROACH;  Surgeon: Mcarthur Rossetti, MD;  Location: [facility] ORS;  Service: Orthopedics;  Laterality: Right;    Family History  Problem Relation Age of Onset  . Heart disease Father        Vague not clearly cardiac    Social History:  reports that she has never smoked. She has never used smokeless tobacco. She reports that she does not drink alcohol or use drugs.  Allergies:  Allergies  Allergen Reactions  . Hydrocodone Itching and Nausea Only    Tolerates Oxycodone  . Tramadol Itching and Nausea Only    Tolerates oxycodone    Medications:  I have reviewed the patient's current medications. Prior to Admission:  Medications Prior to Admission  Medication Sig Dispense Refill Last Dose  . acetaminophen (TYLENOL) 500 MG tablet Take 1,000 mg by mouth every 6 (six) hours as needed for moderate pain.   10/27/2019 at Unknown time  . cyclobenzaprine (FLEXERIL) 5 MG tablet Take 5 mg by mouth at bedtime.   10/27/2019 at Unknown time  . diazepam (VALIUM) 5 MG tablet Take 1 tablet (5 mg total) by mouth every 8 (eight) hours as needed for muscle spasms. 12 tablet 0 10/26/2019  . medroxyPROGESTERone (PROVERA) 10 MG tablet Take 10 mg by mouth daily.   Past Week at Unknown time  .  oxyCODONE-acetaminophen (PERCOCET/ROXICET) 5-325 MG tablet Take 1 tablet by mouth every 6 (six) hours as needed for severe pain. 15 tablet 0 10/25/2019  . polyethylene glycol powder (GLYCOLAX/MIRALAX) powder Take 17 g by mouth 2 (two) times daily as needed for mild constipation. 510 g 3 10/26/2019  . predniSONE (DELTASONE) 50 MG tablet Take 1 tablet (50 mg total) by mouth daily with breakfast. 5 tablet 0 Past Week at Unknown time  . sulfamethoxazole-trimethoprim  (BACTRIM) 400-80 MG tablet Take 1 tablet by mouth daily. 30 tablet 6 Past Month at Unknown time  . SUMAtriptan (IMITREX) 50 MG tablet Take 1 tablet (50 mg total) by mouth every 2 (two) hours as needed for migraine (Maximum dose: 100 mg per dose; 200 mg per 24 hours). May repeat in 2 hours if headache persists or recurs. 10 tablet 0 Past Week at Unknown time  . SYMTUZA 800-150-200-10 MG TABS TAKE 1 TABLET BY MOUTH DAILY WITH BREAKFAST. PLEASE SCHEDULE AN APPOINTMENT FOR FURTHER FILLS (Patient taking differently: Take 1 tablet by mouth every evening. ) 30 tablet 2 10/26/2019  . acetaminophen-codeine (TYLENOL #3) 300-30 MG tablet Take 1-2 tablets by mouth every 8 (eight) hours as needed for moderate pain. (Patient not taking: Reported on 10/28/2019) 40 tablet 0 Not Taking at Unknown time  . gabapentin (NEURONTIN) 100 MG capsule Take 1 capsule (100 mg total) by mouth 3 (three) times daily. (Patient not taking: Reported on 10/28/2019) 90 capsule 3 Not Taking at Unknown time  . methylPREDNISolone (MEDROL) 4 MG tablet Medrol dose pack. Take as instructed (Patient not taking: Reported on 10/28/2019) 21 tablet 0 Not Taking at Unknown time  . omeprazole (PRILOSEC) 20 MG capsule Take 1 capsule (20 mg total) by mouth 2 (two) times daily before a meal. (Patient not taking: Reported on 10/28/2019) 30 capsule 0 Not Taking at Unknown time  . senna-docusate (SENOKOT-S) 8.6-50 MG tablet Take 1 tablet by mouth 2 (two) times daily. (Patient not taking: Reported on 10/28/2019) 60 tablet 1 Not Taking at Unknown time  . SUMAtriptan (IMITREX) 50 MG tablet Take 1 tablet (50 mg total) by mouth every 2 (two) hours as needed for migraine. May repeat in 2 hours if headache persists or recurs. (Patient not taking: Reported on 10/28/2019) 10 tablet 0 Not Taking at Unknown time  . SYMTUZA 800-150-200-10 MG TABS TAKE 1 TABLET BY MOUTH DAILY WITH BREAKFAST. (Patient not taking: Reported on 10/28/2019) 30 tablet 1 Not Taking at Unknown time   . terbinafine (LAMISIL) 1 % cream Apply 1 application topically 2 (two) times daily. (Patient not taking: Reported on 10/28/2019) 30 g 0 Not Taking at Unknown time  . tiZANidine (ZANAFLEX) 4 MG tablet Take 1 tablet (4 mg total) by mouth every 8 (eight) hours as needed for muscle spasms. (Patient not taking: Reported on 10/28/2019) 30 tablet 0 Not Taking at Unknown time   Scheduled:  Continuous: . sodium chloride 125 mL/hr at 10/29/19 0110  . sulfamethoxazole-trimethoprim 336.96 mg (10/29/19 0940)   [id number], ondansetron **OR** ondansetron (ZOFRAN) IV Anti-infectives (From admission, onward)   Start     Dose/Rate Route Frequency Ordered Stop   10/29/19 0800  Darunavir-Cobicisctat-Emtricitabine-Tenofovir Alafenamide (SYMTUZA) 800-150-200-10 MG TABS 1 tablet  Status:  Discontinued     1 tablet Oral Daily with breakfast 10/28/19 2148 10/29/19 1030   10/28/19 2200  sulfamethoxazole-trimethoprim (BACTRIM) 336.96 mg in dextrose 5 % 500 mL IVPB     15 mg/kg/day  67.4 kg (Adjusted) 347.4 mL/hr over 90 Minutes Intravenous Every 8 hours 10/28/19  2051     10/28/19 2000  acyclovir (ZOVIRAX) 830 mg in dextrose 5 % 150 mL IVPB     10 mg/kg  83 kg 166.6 mL/hr over 60 Minutes Intravenous  Once 10/28/19 1945 10/28/19 2148       Results for orders placed or performed during the hospital encounter of 10/28/19 (from the past 48 hour(s))  Urinalysis, Routine w reflex microscopic     Status: Abnormal   Collection Time: 10/28/19  2:56 PM  Result Value Ref Range   Color, Urine YELLOW YELLOW   APPearance HAZY (A) CLEAR   Specific Gravity, Urine 1.016 1.005 - 1.030   pH 7.0 5.0 - 8.0   Glucose, UA NEGATIVE NEGATIVE mg/dL   Hgb urine dipstick NEGATIVE NEGATIVE   Bilirubin Urine NEGATIVE NEGATIVE   Ketones, ur NEGATIVE NEGATIVE mg/dL   Protein, ur NEGATIVE NEGATIVE mg/dL   Nitrite NEGATIVE NEGATIVE   Leukocytes,Ua NEGATIVE NEGATIVE    Comment: Performed at [facility] [address] [place], [place] [address]  Comprehensive metabolic panel     Status: Abnormal   Collection Time: 10/28/19  2:56 PM  Result Value Ref Range   Sodium 136 135 - 145 mmol/L   Potassium 3.8 3.5 - 5.1 mmol/L   Chloride 102 98 - 111 mmol/L   CO2 23 22 - 32 mmol/L   Glucose, Bld 90 70 - 99 mg/dL   BUN 6 6 - 20 mg/dL   Creatinine, Ser 0.69 0.44 - 1.00 mg/dL   Calcium 9.4 8.9 - 10.3 mg/dL   Total Protein 8.4 (H) 6.5 - 8.1 g/dL   Albumin 4.1 3.5 - 5.0 g/dL   AST 22 15 - 41 U/L   ALT 15 0 - 44 U/L   Alkaline Phosphatase 86 38 - 126 U/L   Total Bilirubin 0.5 0.3 - 1.2 mg/dL   GFR calc non Af Amer >60 >60 mL/min   GFR calc Af Amer >60 >60 mL/min   Anion gap 11 5 - 15    Comment: Performed at [facility] [address] [place], [place] [address]  CBC with Differential     Status: Abnormal   Collection Time: 10/28/19  2:56 PM  Result Value Ref Range   WBC 3.8 (L) 4.0 - 10.5 K/uL   RBC 4.01 3.87 - 5.11 MIL/uL   Hemoglobin 13.0 12.0 - 15.0 g/dL   HCT 38.6 36.0 - 46.0 %   MCV 96.3 80.0 - 100.0 fL   MCH 32.4 26.0 - 34.0 pg   MCHC 33.7 30.0 - 36.0 g/dL   RDW 13.0 11.5 - 15.5 %   Platelets 186 150 - 400 K/uL   nRBC 0.0 0.0 - 0.2 %   Neutrophils Relative % 39 %   Neutro Abs 1.5 (L) 1.7 - 7.7 K/uL   Lymphocytes Relative 41 %   Lymphs Abs 1.6 0.7 - 4.0 K/uL   Monocytes Relative 13 %   Monocytes Absolute 0.5 0.1 - 1.0 K/uL   Eosinophils Relative 6 %   Eosinophils Absolute 0.2 0.0 - 0.5 K/uL   Basophils Relative 1 %   Basophils Absolute 0.0 0.0 - 0.1 K/uL   Immature Granulocytes 0 %   Abs Immature Granulocytes 0.01 0.00 - 0.07 K/uL    Comment: Performed at [facility] [address] [place], [place] [address]  I-Stat Beta hCG blood, ED ([facility], [facility], [facility] only)     Status: None   Collection Time: 10/28/19  3:16  PM  Result Value Ref Range   I-stat hCG, quantitative <5.0 <5 mIU/mL   Comment 3            Comment:   GEST. AGE      CONC.  (mIU/mL)   <=1 WEEK        5 - 50     2 WEEKS        50 - 500     3 WEEKS       100 - 10,000     4 WEEKS     1,000 - 30,000        FEMALE AND NON-PREGNANT FEMALE:     LESS THAN 5 mIU/mL   Protime-INR     Status: None   Collection Time: 10/28/19  5:28 PM  Result Value Ref Range   Prothrombin Time 13.1 11.4 - 15.2 seconds   INR 1.0 0.8 - 1.2    Comment: (NOTE) INR goal varies based on device and disease states. Performed at [facility], [place] [address] [place], [place] [address]   SARS CORONAVIRUS 2 (TAT 6-24 HRS) Nasopharyngeal Nasopharyngeal Swab     Status: None   Collection Time: 10/28/19  8:50 PM   Specimen: Nasopharyngeal Swab  Result Value Ref Range   SARS Coronavirus 2 NEGATIVE NEGATIVE    Comment: (NOTE) SARS-CoV-2 target nucleic acids are NOT DETECTED. The SARS-CoV-2 RNA is generally detectable in upper and lower respiratory specimens during the acute phase of infection. Negative results do not preclude SARS-CoV-2 infection, do not rule out co-infections with other pathogens, and should not be used as the sole basis for treatment or other patient management decisions. Negative results must be combined with clinical observations, patient history, and epidemiological information. The expected result is Negative. Fact Sheet for Patients: SugarRoll.be Fact Sheet for Healthcare Providers: https://www.woods-mathews.com/ This test is not yet approved or cleared by the [place] FDA and  has been authorized for detection and/or diagnosis of SARS-CoV-2 by FDA under an Emergency Use Authorization (EUA). This EUA will remain  in effect (meaning this test can be used) for the duration of the COVID-19 declaration under Section 56 4(b)(1) of the Act, 21 U.S.C. section 360bbb-3(b)(1), unless the authorization is terminated or revoked sooner. Performed at [facility], [place] [address] [place], [place] [address]   Cryptococcal antigen     Status: None   Collection Time:  10/28/19 10:09 PM  Result Value Ref Range   Crypto Ag NEGATIVE NEGATIVE   Cryptococcal Ag Titer NOT INDICATED NOT INDICATED    Comment: Performed at [facility], [place] [address], [place], [place] [address]  MRSA PCR Screening     Status: None   Collection Time: 10/29/19  1:12 AM   Specimen: Nasopharyngeal  Result Value Ref Range   MRSA by PCR NEGATIVE NEGATIVE    Comment:        The GeneXpert MRSA Assay (FDA approved for NASAL specimens only), is one component of a comprehensive MRSA colonization surveillance program. It is not intended to diagnose MRSA infection nor to guide or monitor treatment for MRSA infections. Performed at [facility], [place] [address], [place], [place] [address]   Comprehensive metabolic panel     Status: Abnormal   Collection Time: 10/29/19  4:53 AM  Result Value Ref Range   Sodium 137 135 - 145 mmol/L   Potassium 3.7 3.5 - 5.1 mmol/L   Chloride 105 98 - 111 mmol/L  CO2 21 (L) 22 - 32 mmol/L   Glucose, Bld 90 70 - 99 mg/dL   BUN <5 (L) 6 - 20 mg/dL   Creatinine, Ser 0.66 0.44 - 1.00 mg/dL   Calcium 8.6 (L) 8.9 - 10.3 mg/dL   Total Protein 7.2 6.5 - 8.1 g/dL   Albumin 3.4 (L) 3.5 - 5.0 g/dL   AST 17 15 - 41 U/L   ALT 15 0 - 44 U/L   Alkaline Phosphatase 78 38 - 126 U/L   Total Bilirubin 0.2 (L) 0.3 - 1.2 mg/dL   GFR calc non Af Amer >60 >60 mL/min   GFR calc Af Amer >60 >60 mL/min   Anion gap 11 5 - 15    Comment: Performed at [facility] [address] [place], [place] [address]  CBC     Status: Abnormal   Collection Time: 10/29/19  4:53 AM  Result Value Ref Range   WBC 3.3 (L) 4.0 - 10.5 K/uL   RBC 3.90 3.87 - 5.11 MIL/uL   Hemoglobin 12.6 12.0 - 15.0 g/dL   HCT 37.3 36.0 - 46.0 %   MCV 95.6 80.0 - 100.0 fL   MCH 32.3 26.0 - 34.0 pg   MCHC 33.8 30.0 - 36.0 g/dL   RDW 12.9 11.5 - 15.5 %   Platelets 179 150 - 400 K/uL   nRBC 0.0 0.0 - 0.2 %    Comment: Performed at [facility], [place] [address],  [place], [place] [address]  Cortisol     Status: None   Collection Time: 10/29/19  4:53 AM  Result Value Ref Range   Cortisol, Plasma 11.7 ug/dL    Comment: (NOTE) AM    6.7 - 22.6 ug/dL PM   <10.0       ug/dL Performed at [facility] [address], [place], [place] [address]     Dg Chest 2 View  Result Date: 10/29/2019 CLINICAL DATA:  HIV EXAM: CHEST - 2 VIEW COMPARISON:  05/25/2018 FINDINGS: Heart is borderline in size. Low lung volumes. No confluent opacities or effusions. No acute bony abnormality. IMPRESSION: No active cardiopulmonary disease. Electronically Signed   By: Rolm Baptise M.D.   On: 10/29/2019 00:46   Ct Head Wo Contrast  Result Date: 10/28/2019 CLINICAL DATA:  Headache. EXAM: CT HEAD WITHOUT CONTRAST TECHNIQUE: Contiguous axial images were obtained from the base of the skull through the vertex without intravenous contrast. COMPARISON:  CT dated 03/28/2014.  MRI head dated February 18, 2018 FINDINGS: Brain: There is a 6.6 x 4.2 cm area of vasogenic edema primarily involving the right temporal lobe. There is mild mass effect on the right lateral ventricle with a leftward midline shift measuring approximately 3-4 mm. There is no acute intracranial hemorrhage. Vascular: No hyperdense vessel or unexpected calcification. Skull: Normal. Negative for fracture or focal lesion. Sinuses/Orbits: No acute finding. Other: None. IMPRESSION: Large area of vasogenic edema primarily involving the right temporal lobe. There is mass effect and a leftward midline shift measuring approximately 3-4 mm. Follow-up with a contrast-enhanced MRI is recommended for evaluation of an underlying mass or infectious process. Electronically Signed   By: Constance Holster M.D.   On: 10/28/2019 18:22   Mr Brain W And Wo Contrast  Result Date: 10/28/2019 CLINICAL DATA:  Initial evaluation for acute severe headache. History of HIV. EXAM: MRI HEAD WITHOUT AND WITH CONTRAST TECHNIQUE: Multiplanar, multiecho pulse  sequences of the brain and surrounding structures were obtained without  and with intravenous contrast. CONTRAST:  26mL GADAVIST GADOBUTROL 1 MMOL/ML IV SOLN COMPARISON:  Comparison made with prior CT from earlier the same day as well as previous brain MRIs, most recent of which from 02/18/2018. FINDINGS: Brain: Mildly advanced cerebral atrophy for age, similar to 2019. Patchy T2/FLAIR signal abnormality involving the right periatrial white matter is relatively stable from previous. Few additional scattered nonspecific subcentimeter foci of T2/FLAIR signal intensity also relatively unchanged. No abnormal foci of restricted diffusion to suggest acute or subacute ischemia. No encephalomalacia to suggest chronic infarction. No acute intracranial hemorrhage. No extra-axial fluid collection. Irregular cortically based rim enhancing lesion positioned at the anterior-mid right temporal lobe measures 2.4 x 1.9 x 2.4 cm (series 18, image 28), new from previous MRI, corresponding with abnormality seen on prior CT from earlier today. Lesion demonstrates heterogeneous hypointense internal T2 signal intensity with a thick peripheral rind of irregular enhancement. Faint susceptibility artifact suggestive of blood products and/or necrosis. No central restricted diffusion to suggest frank abscess. Associated extensive vasogenic edema throughout the adjacent right temporal lobe with 4 mm of right-to-left midline shift. No hydrocephalus or ventricular trapping. Associated scattered leptomeningeal and dural enhancement within the adjacent right frontotemporal region. No intraventricular debris or ependymal enhancement. No other mass lesion or abnormal enhancement elsewhere within the brain. Vascular: Major intracranial vascular flow voids are well maintained. Skull and upper cervical spine: Craniocervical junction within normal limits. Visualized upper cervical spine normal. Bone marrow signal intensity within normal limits. Scalp soft  tissues within normal limits. Sinuses/Orbits: Globes and orbital soft tissues demonstrate no acute finding. Mild scattered mucosal thickening throughout the ethmoidal air cells. Paranasal sinuses are otherwise clear. Trace left mastoid effusion. Inner ear structures normal. Other: None. IMPRESSION: 1. 2.4 x 1.9 x 2.4 cm cortically based rim enhancing lesion involving the right temporal lobe with associated scattered leptomeningeal and dural enhancement within the adjacent right frontotemporal region. Associated vasogenic edema with up to 4 mm of right-to-left midline shift. Given the history of HIV, primary differential consideration consists of intracranial infection with meningitis and localized cerebritis, including opportunistic infections such as TB or toxoplasmosis. No frank restricted diffusion to suggest intracerebral abscess. Correlation with CSF fluid analysis recommended. Sequelae of IRIS could also be considered in the correct clinical setting. High-grade glioma could also have this appearance, although is felt to be less likely and should only be considered if alternate etiologies are excluded. 2. Underlying mildly advanced generalized cerebral volume loss for age, which could reflect the sequelae of HIV encephalitis. Electronically Signed   By: Jeannine Boga M.D.   On: 10/28/2019 21:39    ROS: As above Blood pressure 106/73, pulse 75, temperature 98.2 F (36.8 C), temperature source Oral, resp. rate 17, height 5\' 5"  (1.651 m), weight 83 kg, SpO2 99 %. Estimated body mass index is 30.45 kg/m as calculated from the following:   Height as of this encounter: 5\' 5"  (1.651 m).   Weight as of this encounter: 83 kg.  Physical Exam  General an alert and pleasant obese 28 year old black female in no apparent distress  HEENT: Normocephalic, atraumatic, pupils equal, extraocular muscles are intact  Neck: Unremarkable  Thorax: Symmetric  Abdomen: Obese and soft  Extremities:  Unremarkable  Neurologic exam: The patient is alert and oriented x3.  Cranial nerves II through XII were grossly normal bilaterally.  The patient's motor strength is normal in her bilateral bicep, tricep, handgrip, gastrocnemius and dorsiflexors.  Cerebellar function is intact to rapid alternating movements of the  upper extremities bilaterally.  Sensory function is intact to light touch sensation all tested dermatomes bilaterally.  I have reviewed the patient's head CT and brain MRI performed yesterday.  He has a small, approximately 2 cm, superficial right temporal lesion with mild to moderate temporal edema.  There is mild midline shift.  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  I do not think there is a high risk of cerebral herniation with lumbar puncture, but this is certainly a risk.  Under the circumstances I do not think brain surgery is the next best course of action.  I would suggest empiric treatment for toxoplasmosis.  If the lesion does not resolve then perhaps we could revisit a biopsy in the future.  Please call if I can be of further assistance. Ophelia Charter 10/29/2019, 2:39 PM

## 2019-10-29 NOTE — Progress Notes (Addendum)
If needed Please page [phone] to contact family medicine service in regards to this patient  Magnolia Mattila MD PGY-3 Family Medicine Resident 

## 2019-10-29 NOTE — Progress Notes (Signed)
Family Medicine Teaching Service Daily Progress Note Contact Pager: [phone]  Patient name: Kelsey Rodriguez Medical record number: [id number] Date of birth: 06-May-1991 Age: 28 y.o. Gender: female  Primary Care Provider: Gerlene Fee, DO Consultants: Infectious Disease Code Status: full  Pt Overview and Major Events to Date:  10/28/2019 admitted to fpts, ID consulted   Assessment and Plan: Kelsey Rodriguez is a 28 y.o. female presenting with headache and back pain x 2weeks . PMH is significant for HIV, meningitis, syphilis, Bell's palsy, primary adrenal insufficiency, tuberculosis, herpes simplex esophagitis.  Headache/CNS lesion with mass-effect 2.4 x 1.9 x 2.4 cortically based rim-enhancing lesion in the right temporal lobe, associated 4 mm right to left midline shift as seen on MRI brain.  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  ID following, appreciate their recommendations.  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  Currently on Bactrim.  Will start on sulfadiazine plus pyrimethamine plus leucovorin as those medications become available.  Discussed with neuro in regards to lumbar puncture that infectious disease recommended.  They recommended to not perform LP until they had a chance to look into everything.  Patient is also had a dose of Lovenox today. -Infectious disease following, appreciate their recommendations -Neurology to evaluate for potential LP -Continue Bactrim, dosing per pharmacy -Discontinue Symtuza p.o. per ID-Continue Tylenol for pain control -Discontinue Toradol, due to bleeding risk if needs LP -Neurochecks every 4 hours -Vital signs per floor routine -Follow-up CD4 count, HIV-1 RNA quant -Administration of sulfadiazine, pyrimethamine, leucovorin as those medications become available -Continue n.p.o. pending possible lumbar puncture -Holding Lovenox, SCDs for DVT prophylaxis  HIV/AIDS CD4 count  back in May of 70.  Supposed be on Symtuza, but is taking quite irregularly.  Still awaiting numerous laboratory findings to result, holding Symtuza per ID consult. -Follow-up CD4 count -Holding Symtuza per ID  Primary adrenal insufficiency/history of avascular necrosis bilateral hips A.m. cortisol level 11.7, within normal limits.  Still pending ACTH.  Consider administration of stress dose steroids, but will hold off for the time being given the above and pending labs.  Patient was previously on steroids, but this was stopped due to avascular necrosis.  Patient does have bilateral hip replacements.  History of tuberculosis No active cardiopulmonary disease on admission.  Tuberculosis is on differential for possibility of rim-enhancing lesion in brain.  No active lung manifestations, would not be considered contagious at this point.  We will follow-up testing for mycoplasma.    FEN/GI: N.p.o. PPx: Lovenox 40 mg daily  Disposition: Continue progressive unit  Subjective:  Feels "much better today".  Feels that her weakness has improved somewhat.  No complaints  Objective: Temp:  [98.3 F (36.8 C)-99.8 F (37.7 C)] 98.3 F (36.8 C) (11/14 0802) Pulse Rate:  [67-92] 67 (11/14 0802) Resp:  [14-18] 16 (11/14 0802) BP: (107-129)/(71-100) 110/74 (11/14 0802) SpO2:  [98 %-100 %] 100 % (11/14 0802) Weight:  [83 kg] 83 kg (11/13 1812) Physical Exam: General: 28 year old female, no acute distress, very comfortable Cardiovascular: Regular rate rhythm, no M/R/G Respiratory: Lungs clear to auscultation bilaterally, no Abdomen: Soft, nontender, nondistended Extremities: 5/5 strength bilateral upper extremity, bilateral lower extremity Neuro: CN 2-12 intact, 5/5 strength BUE, BLE  Laboratory: Recent Labs  Lab 10/28/19 1456 10/29/19 0453  WBC 3.8* 3.3*  HGB 13.0 12.6  HCT 38.6 37.3  PLT 186 179   Recent Labs  Lab 10/28/19 1456 10/29/19 0453  NA 136 137  K 3.8 3.7  CL 102 105  CO2  23 21*  BUN 6 <5*  CREATININE 0.69 0.66  CALCIUM 9.4 8.6*  PROT 8.4* 7.2  BILITOT 0.5 0.2*  ALKPHOS 86 78  ALT 15 15  AST 22 17  GLUCOSE 90 90   Imaging/Diagnostic Tests: EXAM: CHEST - 2 VIEW  COMPARISON:  05/25/2018  FINDINGS: Heart is borderline in size. Low lung volumes. No confluent opacities or effusions. No acute bony abnormality.  IMPRESSION: No active cardiopulmonary disease.   Guadalupe [name], MD 10/29/2019, 9:45 AM PGY-3, [place] Intern pager: [phone], text pages welcome

## 2019-10-29 NOTE — ED Notes (Signed)
attempted to call report

## 2019-10-29 NOTE — Consult Note (Signed)
[facility] for Infectious Disease       Reason for Consult: CNS infection, AIDS    Referring Physician: Dr. Gwendlyn Deutscher  Active Problems:   Cerebral edema ([place])   . Darunavir-Cobicisctat-Emtricitabine-Tenofovir Alafenamide  1 tablet Oral Q breakfast  . enoxaparin (LOVENOX) injection  40 mg Subcutaneous Q24H    Recommendations: Large volume CSF for cell count, protein, glucose, toxoplasmosis PCR, Cryptococcal Ag, AFB (this requires the large volume), MTB NAAT, VDRL.  I do not see any mass effect on the MRI.   Also do an opening pressure  Hold off on ARVs pending evaluation of above  Continue Bactrim for presumed Toxo  Toxo IgG already known positive in the past  RPR  Assessment: She has a low CD4 count c/w AIDS due to her poor compliance and now most c/w toxoplasmosis but the differential remains broad, including non-infectious.  I reviewed the MRI and some ring enhancing but large area.  Still concern for Tb meningitis (does not require isolation), Cryptococcal disease, syphilis.    Antibiotics: bactrim  HPI: Machele Bih [name] is a 28 y.o. female with HIV, poorly compliant now with progressive headaches and findings of above.  No associated weakness, no vision changes.  No associated weight loss or diarrhea.  She reports getting a headache with her ARVs previously and so did not take them.  She has some positional lightheadedness.  Last CD4 in May 70, viral load 120,000.  Some chills, no fever.     Review of Systems:  Constitutional: positive for chills or negative for sweats, fatigue, malaise, anorexia and weight loss Gastrointestinal: negative for nausea and diarrhea Integument/breast: negative for rash Musculoskeletal: negative for myalgias and arthralgias All other systems reviewed and are negative    Past Medical History:  Diagnosis Date  . 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 Adrenal insufficiency ([place])   . Anemia of chronic disease 03/11/2012  . Back pain  of lumbar region with sciatica 02/12/2015  . Bell's palsy 08/26/2013  . 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 Chronic back pain   . Chronic leg pain    bilateral knees, ankles  . Fatigue   . 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 HIV (human immunodeficiency virus infection) ([place]) 02/2012  . Laceration of ankle, right 11/18/2012  . Lumbar radiculopathy   . Meningitis 02/18/2018  . Pelvic pain   . PID (acute pelvic inflammatory disease) 02/26/2018  . Reflux esophagitis 03/11/2012  . Tuberculosis   . 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 Vertigo     Social History   Tobacco Use  . Smoking status: Never Smoker  . Smokeless tobacco: Never Used  Substance Use Topics  . Alcohol use: No    Alcohol/week: 0.0 standard drinks    Comment: socially  . Drug use: No    Family History  Problem Relation Age of Onset  . Heart disease Father        Vague not clearly cardiac    Allergies  Allergen Reactions  . Hydrocodone Itching and Nausea Only    Tolerates Oxycodone  . Tramadol Itching and Nausea Only    Tolerates oxycodone    Physical Exam: Constitutional: in no apparent distress  Vitals:   10/29/19 0633 10/29/19 0802  BP: 108/71 110/74  Pulse: 80 67  Resp: 18 16  Temp: 98.3 F (36.8 C) 98.3 F (36.8 C)  SpO2: 100% 100%   EYES: anicteric ENMT: no thrush Cardiovascular: Cor RRR Respiratory: CTA B; normal respiratory effort GI: Bowel sounds  are normal, liver is not enlarged, spleen is not enlarged Musculoskeletal: no pedal edema noted Skin: negatives: no rash Hematologic: no cervical lad Neuro: non-focal  Lab Results  Component Value Date   WBC 3.3 (L) 10/29/2019   HGB 12.6 10/29/2019   HCT 37.3 10/29/2019   MCV 95.6 10/29/2019   PLT 179 10/29/2019    Lab Results  Component Value Date   CREATININE 0.66 10/29/2019   BUN <5 (L) 10/29/2019   NA 137 10/29/2019   K 3.7 10/29/2019   CL 105 10/29/2019   CO2 21 (L) 10/29/2019    Lab Results  Component Value Date    ALT 15 10/29/2019   AST 17 10/29/2019   ALKPHOS 78 10/29/2019     Microbiology: Recent Results (from the past 240 hour(s))  SARS CORONAVIRUS 2 (TAT 6-24 HRS) Nasopharyngeal Nasopharyngeal Swab     Status: None   Collection Time: 10/28/19  8:50 PM   Specimen: Nasopharyngeal Swab  Result Value Ref Range Status   SARS Coronavirus 2 NEGATIVE NEGATIVE Final    Comment: (NOTE) SARS-CoV-2 target nucleic acids are NOT DETECTED. The SARS-CoV-2 RNA is generally detectable in upper and lower respiratory specimens during the acute phase of infection. Negative results do not preclude SARS-CoV-2 infection, do not rule out co-infections with other pathogens, and should not be used as the sole basis for treatment or other patient management decisions. Negative results must be combined with clinical observations, patient history, and epidemiological information. The expected result is Negative. Fact Sheet for Patients: SugarRoll.be Fact Sheet for Healthcare Providers: https://www.woods-mathews.com/ This test is not yet approved or cleared by the [place] FDA and  has been authorized for detection and/or diagnosis of SARS-CoV-2 by FDA under an Emergency Use Authorization (EUA). This EUA will remain  in effect (meaning this test can be used) for the duration of the COVID-19 declaration under Section 56 4(b)(1) of the Act, 21 U.S.C. section 360bbb-3(b)(1), unless the authorization is terminated or revoked sooner. Performed at [facility], [place] [address], [place], [place] [address]   MRSA PCR Screening     Status: None   Collection Time: 10/29/19  1:12 AM   Specimen: Nasopharyngeal  Result Value Ref Range Status   MRSA by PCR NEGATIVE NEGATIVE Final    Comment:        The GeneXpert MRSA Assay (FDA approved for NASAL specimens only), is one component of a comprehensive MRSA colonization surveillance program. It is not intended to diagnose  MRSA infection nor to guide or monitor treatment for MRSA infections. Performed at [facility], [place] [address] [place],  [address]     Maizee Reinhold W Magaret Justo, [place] for Infectious Disease [facility] [email or web address] 10/29/2019, 10:08 AM

## 2019-10-29 NOTE — Telephone Encounter (Signed)
Entered in error  Guadalupe [name] MD PGY-3 Family Medicine Resident

## 2019-10-30 DIAGNOSIS — G939 Disorder of brain, unspecified: Secondary | ICD-10-CM

## 2019-10-30 DIAGNOSIS — R112 Nausea with vomiting, unspecified: Secondary | ICD-10-CM

## 2019-10-30 LAB — TOXOPLASMA GONDII ANTIBODY, IGG: Toxoplasma IgG Ratio: >400 IU/mL — ABNORMAL HIGH (ref 0.0–7.1)

## 2019-10-30 LAB — BASIC METABOLIC PANEL
Anion gap: 10 (ref 5–15)
BUN: <5 mg/dL — ABNORMAL LOW (ref 6–20)
CO2: 20 mmol/L — ABNORMAL LOW (ref 22–32)
Calcium: 8.7 mg/dL — ABNORMAL LOW (ref 8.9–10.3)
Chloride: 107 mmol/L (ref 98–111)
Creatinine, Ser: 0.78 mg/dL (ref 0.44–1.00)
GFR calc Af Amer: >60 mL/min (ref >60)
GFR calc non Af Amer: >60 mL/min (ref >60)
Glucose, Bld: 89 mg/dL (ref 70–99)
Potassium: 3.9 mmol/L (ref 3.5–5.1)
Sodium: 137 mmol/L (ref 135–145)

## 2019-10-30 LAB — CBC WITH DIFFERENTIAL/PLATELET
Abs Immature Granulocytes: 0.02 10*3/uL (ref 0.00–0.07)
Basophils Absolute: 0 10*3/uL (ref 0.0–0.1)
Basophils Relative: 0 %
Eosinophils Absolute: 0.1 10*3/uL (ref 0.0–0.5)
Eosinophils Relative: 6 %
HCT: 36.3 % (ref 36.0–46.0)
Hemoglobin: 12.2 g/dL (ref 12.0–15.0)
Immature Granulocytes: 1 %
Lymphocytes Relative: 36 %
Lymphs Abs: 0.9 10*3/uL (ref 0.7–4.0)
MCH: 32 pg (ref 26.0–34.0)
MCHC: 33.6 g/dL (ref 30.0–36.0)
MCV: 95.3 fL (ref 80.0–100.0)
Monocytes Absolute: 0.4 10*3/uL (ref 0.1–1.0)
Monocytes Relative: 16 %
Neutro Abs: 1 10*3/uL — ABNORMAL LOW (ref 1.7–7.7)
Neutrophils Relative %: 41 %
Platelets: 167 10*3/uL (ref 150–400)
RBC: 3.81 MIL/uL — ABNORMAL LOW (ref 3.87–5.11)
RDW: 12.8 % (ref 11.5–15.5)
WBC: 2.5 10*3/uL — ABNORMAL LOW (ref 4.0–10.5)
nRBC: 0 % (ref 0.0–0.2)

## 2019-10-30 MED ORDER — OXYCODONE HCL 5 MG PO TABS
5.0000 mg | ORAL_TABLET | ORAL | Status: AC | PRN
  Administered 2019-10-30 (×2): 5 mg via ORAL
  Filled 2019-10-30 (×3): qty 1

## 2019-10-30 MED ORDER — ONDANSETRON HCL 4 MG PO TABS
8.0000 mg | ORAL_TABLET | Freq: Four times a day (QID) | ORAL | Status: DC | PRN
  Administered 2019-10-30 – 2019-11-09 (×5): 8 mg via ORAL
  Filled 2019-10-30 (×5): qty 2

## 2019-10-30 MED ORDER — SODIUM CHLORIDE 0.9 % IV SOLN
8.0000 mg | Freq: Four times a day (QID) | INTRAVENOUS | Status: DC | PRN
  Administered 2019-10-31 (×2): 8 mg via INTRAVENOUS
  Filled 2019-10-30 (×5): qty 4

## 2019-10-30 NOTE — Progress Notes (Addendum)
Family Medicine Teaching Service Daily Progress Note Intern Pager: [phone]  Patient name: Kelsey Rodriguez Medical record number: [id number] Date of birth: 1991/04/05 Age: 28 y.o. Gender: female  Primary Care Provider: Gerlene Fee, DO Consultants: Infectious Disease Code Status: Full  Pt Overview and Major Events to Date:  10/28/2019: Admitted   Assessment and Plan: Kelsey Rodriguez a 28 y.o.femalepresenting with headache and back pain x 2weeks. PMH is significant for HIV, meningitis, syphilis, Bell's palsy, primary adrenal insufficiency,tuberculosis,herpessimplexesophagitis.  Headache/CNS lesion with mass-effect 2.4 x 1.9 x 2.4 cortically based rim-enhancing lesion in the right temporal lobe, associated 4 mm right to left midline shift as seen on MRI brain.  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    - Infectious disease following, appreciate their recommendations - Neurology to evaluate for potential LP - state patient is not a candidate for LP due to mass-effect; neurology will look into further options for evaluation/treatment - Continue Bactrim, dosing per pharmacy - Plan to start on sulfadiazine plus pyrimethamine plus leucovorin as those medications become available. - Discontinue Symtuza p.o. per ID-Continue Tylenol for pain control - Discontinue Toradol, due to bleeding risk if needs LP - Neurochecks every 4 hours - Vital signs per floor routine - CD4 count, HIV-1 RNA quant ordered, drawn: Still pending - Administration of sulfadiazine, pyrimethamine, leucovorin as those medications become available - Continue n.p.o. pending possible lumbar puncture - Holding Lovenox, SCDs for DVT prophylaxis  Nausea: patient having severe nausea, even smelling foods is making her feel sick to stomach. Could be d/t to Bactrim with added on Oxy for pain,  -Zofran 4 increased  to Zofran 8mg  q6 PRN -Follow up EKG 11/15 to check for QTc changes (patient does NOT have hx of QTc prolongation)  HIV/AIDS CD4 count May 2020 of 70.  Patient with history of noncompliance of Symtuza. Still awaiting numerous laboratory findings to result. -Follow-up CD4 count -Holding Symtuza per ID  Primary adrenal insufficiency/history of avascular necrosis bilateral hips A.m. cortisol level 11.7, within normal limits. Patient was previously on steroids, but this was stopped due to avascular necrosis.  Patient does have bilateral hip replacements. -Follow-up ACTH  History of tuberculosis CXR w/o active cardiopulmonary disease on admission.  Tuberculosis is on differential for possibility of rim-enhancing lesion in brain.  No active lung manifestations, would not be considered contagious at this point.  We will follow-up testing for mycoplasma. -Continue to monitor for symptoms  FEN/GI: Regular diet PPx: SCDs  Disposition: Continue care on progressive unit  Subjective:  Patient is a very pleasant female seen this morning resting in bed.  States she just got to bed at 5:00 this morning because she had a terrible headache last night.  States headache is located in her forehead, on the right side of her head, and in the base of her skull/in her neck.  She has also had bad nausea, most likely due to antibiotics versus oxycodone for pain.  She otherwise denies any loss of sensation in her limbs or face; denies any weaknesses or focal neurological deficits.  Objective: Temp:  [98.2 F (36.8 C)-100.1 F (37.8 C)] 98.4 F (36.9 C) (11/15 0940) Pulse Rate:  [71-84] 72 (11/15 0730) Resp:  [16-20] 16 (11/15 0730) BP: (106-111)/(67-78) 110/78 (11/15 0730) SpO2:  [99 %-100 %] 100 % (11/15 0730) Physical Exam: General: Pleasant patient, nontoxic appearing, no apparent distress Cardiovascular: RRR, S1-S2 present, no murmurs appreciated  Respiratory: CTA bilaterally, no adventitious sounds  appreciated, normal work of breathing Abdomen: Soft, nontender, normal bowel sounds appreciated Extremities: No deformity, cyanosis, or edema; no focal neurological deficits appreciated, patient able to move all limbs spontaneously; sensation intact to bilateral upper and lower extremities.  Laboratory: Recent Labs  Lab 10/28/19 1456 10/29/19 0453 10/30/19 0506  WBC 3.8* 3.3* 2.5*  HGB 13.0 12.6 12.2  HCT 38.6 37.3 36.3  PLT 186 179 167   Recent Labs  Lab 10/28/19 1456 10/29/19 0453 10/30/19 0506  NA 136 137 137  K 3.8 3.7 3.9  CL 102 105 107  CO2 23 21* 20*  BUN 6 <5* <5*  CREATININE 0.69 0.66 0.78  CALCIUM 9.4 8.6* 8.7*  PROT 8.4* 7.2  --   BILITOT 0.5 0.2*  --   ALKPHOS 86 78  --   ALT 15 15  --   AST 22 17  --   GLUCOSE 90 90 89   Cortisol (11/14): 11.7 within normal limits ACTH: Pending RPR: Pending  Imaging/Diagnostic Tests: Dg Chest 2 View Result Date: 10/29/2019 CLINICAL DATA:  HIV EXAM: CHEST - 2 VIEW COMPARISON:  05/25/2018 FINDINGS: Heart is borderline in size. Low lung volumes. No confluent opacities or effusions. No acute bony abnormality. IMPRESSION: No active cardiopulmonary disease. Electronically Signed   By: Rolm Baptise M.D.   On: 10/29/2019 00:46   Ct Head Wo Contrast Result Date: 10/28/2019 CLINICAL DATA:  Headache. EXAM: CT HEAD WITHOUT CONTRAST TECHNIQUE: Contiguous axial images were obtained from the base of the skull through the vertex without intravenous contrast. COMPARISON:  CT dated 03/28/2014.  MRI head dated February 18, 2018 FINDINGS: Brain: There is a 6.6 x 4.2 cm area of vasogenic edema primarily involving the right temporal lobe. There is mild mass effect on the right lateral ventricle with a leftward midline shift measuring approximately 3-4 mm. There is no acute intracranial hemorrhage. Vascular: No hyperdense vessel or unexpected calcification. Skull: Normal. Negative for fracture or focal lesion. Sinuses/Orbits: No acute finding. Other:  None. IMPRESSION: Large area of vasogenic edema primarily involving the right temporal lobe. There is mass effect and a leftward midline shift measuring approximately 3-4 mm. Follow-up with a contrast-enhanced MRI is recommended for evaluation of an underlying mass or infectious process.  [name] Contrast IMPRESSION: 1. 2.4 x 1.9 x 2.4 cm cortically based rim enhancing lesion involving the right temporal lobe with associated scattered leptomeningeal and dural enhancement within the adjacent right frontotemporal region. Associated vasogenic edema with up to 4 mm of right-to-left midline shift. Given the history of HIV, primary differential consideration consists of intracranial infection with meningitis and localized cerebritis, including opportunistic infections such as TB or toxoplasmosis. No frank restricted diffusion to suggest intracerebral abscess. Correlation with CSF fluid analysis recommended. Sequelae of IRIS could also be considered in the correct clinical setting. High-grade glioma could also have this appearance, although is felt to be less likely and should only be considered if alternate etiologies are excluded. 2. Underlying mildly advanced generalized cerebral volume loss for age, which could reflect the sequelae of HIV encephalitis.    Daisy Floro, DO 10/30/2019, 9:49 AM PGY-2, [place] Intern pager: [phone], text pages welcome

## 2019-10-30 NOTE — Progress Notes (Signed)
[facility] for Infectious Disease   Reason for visit: Follow up on possible toxoplasmosis  Interval History: nausea with vomiting, afebrile, WBC 2.5, remains on Bactrim.  ARVs stopped.   Day 3 bactrim  Physical Exam: Constitutional:  Vitals:   10/30/19 0730 10/30/19 0940  BP: 110/78   Pulse: 72   Resp: 16   Temp: 98.2 F (36.8 C) 98.4 F (36.9 C)  SpO2: 100%    patient appears in NAD Eyes: anicteric Respiratory: Normal respiratory effort; CTA B Cardiovascular: RRR GI: soft, nt, nd  Review of Systems: Constitutional: negative for fevers, chills and malaise Respiratory: negative for cough or sputum Gastrointestinal: negative for diarrhea Integument/breast: negative for rash  Lab Results  Component Value Date   WBC 2.5 (L) 10/30/2019   HGB 12.2 10/30/2019   HCT 36.3 10/30/2019   MCV 95.3 10/30/2019   PLT 167 10/30/2019    Lab Results  Component Value Date   CREATININE 0.78 10/30/2019   BUN <5 (L) 10/30/2019   NA 137 10/30/2019   K 3.9 10/30/2019   CL 107 10/30/2019   CO2 20 (L) 10/30/2019    Lab Results  Component Value Date   ALT 15 10/29/2019   AST 17 10/29/2019   ALKPHOS 78 10/29/2019     Microbiology: Recent Results (from the past 240 hour(s))  Culture, blood (routine x 2)     Status: None (Preliminary result)   Collection Time: 10/28/19  5:32 PM   Specimen: BLOOD  Result Value Ref Range Status   Specimen Description BLOOD BLOOD RIGHT FOREARM  Final   Special Requests   Final    BOTTLES DRAWN AEROBIC AND ANAEROBIC Blood Culture adequate volume   Culture   Final    NO GROWTH < 24 HOURS Performed at [facility], [address] [place], [place] [address]    Report Status PENDING  Incomplete  Culture, blood (routine x 2)     Status: None (Preliminary result)   Collection Time: 10/28/19  6:40 PM   Specimen: BLOOD  Result Value Ref Range Status   Specimen Description BLOOD RIGHT ANTECUBITAL  Final   Special Requests   Final   BOTTLES DRAWN AEROBIC AND ANAEROBIC Blood Culture adequate volume   Culture   Final    NO GROWTH < 24 HOURS Performed at [facility], [place] [address] [place], [place] [address]    Report Status PENDING  Incomplete  SARS CORONAVIRUS 2 (TAT 6-24 HRS) Nasopharyngeal Nasopharyngeal Swab     Status: None   Collection Time: 10/28/19  8:50 PM   Specimen: Nasopharyngeal Swab  Result Value Ref Range Status   SARS Coronavirus 2 NEGATIVE NEGATIVE Final    Comment: (NOTE) SARS-CoV-2 target nucleic acids are NOT DETECTED. The SARS-CoV-2 RNA is generally detectable in upper and lower respiratory specimens during the acute phase of infection. Negative results do not preclude SARS-CoV-2 infection, do not rule out co-infections with other pathogens, and should not be used as the sole basis for treatment or other patient management decisions. Negative results must be combined with clinical observations, patient history, and epidemiological information. The expected result is Negative. Fact Sheet for Patients: SugarRoll.be Fact Sheet for Healthcare Providers: https://www.woods-mathews.com/ This test is not yet approved or cleared by the [place] FDA and  has been authorized for detection and/or diagnosis of SARS-CoV-2 by FDA under an Emergency Use Authorization (EUA). This EUA will remain  in effect (meaning this test can be used) for the  duration of the COVID-19 declaration under Section 56 4(b)(1) of the Act, 21 U.S.C. section 360bbb-3(b)(1), unless the authorization is terminated or revoked sooner. Performed at [facility], [place] [address] [place], [place] [address]   MRSA PCR Screening     Status: None   Collection Time: 10/29/19  1:12 AM   Specimen: Nasopharyngeal  Result Value Ref Range Status   MRSA by PCR NEGATIVE NEGATIVE Final    Comment:        The GeneXpert MRSA Assay (FDA approved for NASAL specimens only), is one  component of a comprehensive MRSA colonization surveillance program. It is not intended to diagnose MRSA infection nor to guide or monitor treatment for MRSA infections. Performed at [facility], [place] [address], [place], [place] [address]     Impression/Plan:  1. CNS rim enhancing lesion - toxoplasmosis the leading differential.  Others including lymphoma also possible.  No LP done due to some mass effect so will not be able to rule in other possibilities.   Will continue Bactrim Will try to get pyrimethamine this weeks  2.  HIV/AIDS - holding on ARVs at this time due to unknown diagnosis.  Last CD4 count 70.    3.  Nausea with vomiting - may be partly from the medications.  Will need though to continue with Bactrim for treatment.

## 2019-10-31 ENCOUNTER — Ambulatory Visit (HOSPITAL_COMMUNITY): Admission: RE | Admit: 2019-10-31 | Payer: Medicaid Other | Source: Ambulatory Visit

## 2019-10-31 LAB — BASIC METABOLIC PANEL
Anion gap: 8 (ref 5–15)
BUN: <5 mg/dL — ABNORMAL LOW (ref 6–20)
CO2: 19 mmol/L — ABNORMAL LOW (ref 22–32)
Calcium: 8.8 mg/dL — ABNORMAL LOW (ref 8.9–10.3)
Chloride: 111 mmol/L (ref 98–111)
Creatinine, Ser: 0.7 mg/dL (ref 0.44–1.00)
GFR calc Af Amer: >60 mL/min (ref >60)
GFR calc non Af Amer: >60 mL/min (ref >60)
Glucose, Bld: 84 mg/dL (ref 70–99)
Potassium: 3.9 mmol/L (ref 3.5–5.1)
Sodium: 138 mmol/L (ref 135–145)

## 2019-10-31 LAB — CBC
HCT: 37 % (ref 36.0–46.0)
Hemoglobin: 12.2 g/dL (ref 12.0–15.0)
MCH: 31.2 pg (ref 26.0–34.0)
MCHC: 33 g/dL (ref 30.0–36.0)
MCV: 94.6 fL (ref 80.0–100.0)
Platelets: 177 10*3/uL (ref 150–400)
RBC: 3.91 MIL/uL (ref 3.87–5.11)
RDW: 13 % (ref 11.5–15.5)
WBC: 2.5 10*3/uL — ABNORMAL LOW (ref 4.0–10.5)
nRBC: 0 % (ref 0.0–0.2)

## 2019-10-31 LAB — T-HELPER CELLS (CD4) COUNT (NOT AT ARMC)
CD4 % Helper T Cell: 4 % — ABNORMAL LOW (ref 33–65)
CD4 T Cell Abs: 65 /uL — ABNORMAL LOW (ref 400–1790)

## 2019-10-31 LAB — ACTH: C206 ACTH: 32 pg/mL (ref 7.2–63.3)

## 2019-10-31 LAB — T.PALLIDUM AB, TOTAL: T Pallidum Abs: NONREACTIVE

## 2019-10-31 MED ORDER — PROMETHAZINE HCL 25 MG/ML IJ SOLN
25.0000 mg | Freq: Four times a day (QID) | INTRAMUSCULAR | Status: DC | PRN
  Administered 2019-11-01: 25 mg via INTRAVENOUS
  Filled 2019-10-31: qty 1

## 2019-10-31 MED ORDER — PROMETHAZINE HCL 25 MG PO TABS: 25.0000 mg | ORAL_TABLET | Freq: Four times a day (QID) | ORAL | Status: DC | PRN

## 2019-10-31 NOTE — Progress Notes (Signed)
    [facility] for Infectious Disease    Date of Admission:  10/28/2019   Total days of antibiotics 4 bactrim           ID: Kelsey Rodriguez is a 28 y.o. female with poorly controlled hiv disease, admitted for severe headaches found to have ring enhancing lesions on mri Active Problems:   Cerebral edema (HCC)    Subjective: Headache improved. Only mild temporal throbbing. No n/v. Still some light sensitivity. Denies neck pain. No fever/chills/nightsweats  Medications:  . enoxaparin (LOVENOX) injection  40 mg Subcutaneous Q24H    Objective: Vital signs in last 24 hours: Temp:  [97.9 F (36.6 C)-98.9 F (37.2 C)] 98.5 F (36.9 C) (11/16 1937) Pulse Rate:  [68-77] 70 (11/16 1937) Resp:  [16-18] 16 (11/16 1150) BP: (103-123)/(66-92) 103/66 (11/16 1937) SpO2:  [100 %] 100 % (11/16 1937) Physical Exam  Constitutional:  oriented to person, place, and time. appears well-developed and well-nourished. No distress.  HENT: [place]/AT, PERRLA, no scleral icterus Mouth/Throat: Oropharynx is clear and moist. No oropharyngeal exudate. +thrush Cardiovascular: Normal rate, regular rhythm and normal heart sounds. Exam reveals no gallop and no friction rub.  No murmur heard.  Pulmonary/Chest: Effort normal and breath sounds normal. No respiratory distress.  has no wheezes.  Neck = supple, no nuchal rigidity Abdominal: Soft. Bowel sounds are normal.  exhibits no distension. There is no tenderness.  Lymphadenopathy: no cervical adenopathy. No axillary adenopathy Neurological: alert and oriented to person, place, and time.  Skin: Skin is warm and dry. No rash noted. No erythema.  Psychiatric: a normal mood and affect.  behavior is normal.    Lab Results Recent Labs    10/30/19 0506 10/31/19 0508  WBC 2.5* 2.5*  HGB 12.2 12.2  HCT 36.3 37.0  NA 137 138  K 3.9 3.9  CL 107 111  CO2 20* 19*  BUN <5* <5*  CREATININE 0.78 0.70   Liver Panel Recent Labs    10/29/19 0453  PROT 7.2   ALBUMIN 3.4*  AST 17  ALT 15  ALKPHOS 78  BILITOT 0.2*    Microbiology: reviewed Studies/Results: No results found.   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  [facility] for Infectious Diseases Cell: [phone] Pager: [phone]  10/31/2019, 8:35 PM

## 2019-10-31 NOTE — Progress Notes (Signed)
Family Medicine Teaching Service Daily Progress Note Intern Pager: [phone]  Patient name: Kelsey Rodriguez Medical record number: [id number] Date of birth: [birth year] Age: 28 y.o. Gender: female  Primary Care Provider: Gerlene Fee, DO Consultants: Infectious Disease Code Status: Full  Pt Overview and Major Events to Date:  10/28/2019: Admitted   Assessment and Plan: Kelsey Rodriguez a 28 y.o.femalepresenting with headache and back pain x 2weeks. PMH is significant for HIV, meningitis, syphilis, Bell's palsy, primary adrenal insufficiency,tuberculosis,herpessimplexesophagitis.  Headache/CNS lesion with mass-effect 2.4 x 1.9 x 2.4 cortically based rim-enhancing lesion in the right temporal lobe, associated 4 mm right to left midline shift as seen on MRI brain.  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    - Infectious disease following, appreciate their recommendations - Neurology to evaluate for potential LP - state patient is not a candidate for LP due to mass-effect; neurology will look into further options for evaluation/treatment - Continue Bactrim, dosing per pharmacy - Plan to start on sulfadiazine plus pyrimethamine plus leucovorin as those medications become available. - Continue Tylenol for pain control - Neurochecks every 4 hours - Vital signs per floor routine - CD4 count, HIV-1 RNA quant ordered, drawn: Still pending - Lovenox for DVT prophylaxis  Nausea: patient having severe nausea, able to tolerate smells today, had a small bite of food, still very nauseaus. Could be d/t to Bactrim with added on Oxy for pain,  -Zofran 8mg  q6 PRN -EKG 11/15 without QTc changes  HIV/AIDS CD4 count May 2020 of 70.  Patient with history of noncompliance of Symtuza. Still awaiting numerous laboratory findings to result. -Follow-up CD4 count -Holding Symtuza per  ID  Primary adrenal insufficiency/history of avascular necrosis bilateral hips A.m. cortisol level 11.7, within normal limits. Patient was previously on steroids, but this was stopped due to avascular necrosis.  Patient does have bilateral hip replacements. -Follow-up ACTH  History of tuberculosis CXR w/o active cardiopulmonary disease on admission.  Tuberculosis is on differential for possibility of rim-enhancing lesion in brain.  No active lung manifestations, would not be considered contagious at this point.  We will follow-up testing for mycoplasma. -Continue to monitor for symptoms  FEN/GI: Regular diet PPx: SCDs  Disposition: Continue care on progressive unit  Subjective:  Patient is a very pleasant female seen this morning sitting up in bed.  She has no headache today. Nausea persists, but it is not as bad as yesterday, she was able to tolerate a small amount of food, but eating made nausea worse. She still has trouble tolerating lights.  She otherwise denies any loss of sensation in her limbs or face; denies any weaknesses or focal neurological deficits.  Objective: Temp:  [97.9 F (36.6 C)-98.9 F (37.2 C)] 98.9 F (37.2 C) (11/16 0754) Pulse Rate:  [66-77] 77 (11/16 0754) Resp:  [16-18] 16 (11/16 0754) BP: (101-119)/(71-81) 108/77 (11/16 0754) SpO2:  [100 %] 100 % (11/16 0754) Physical Exam: General: Pleasant patient, nontoxic appearing, no apparent distress Cardiovascular: RRR, S1-S2 present, no murmurs appreciated Respiratory: CTA bilaterally, no adventitious sounds appreciated, normal work of breathing Abdomen: Soft, nontender, normal bowel sounds appreciated Extremities: No deformity, cyanosis, or edema; no focal neurological deficits appreciated, patient able to move all limbs spontaneously; sensation intact to bilateral upper and lower extremities.  Laboratory: Recent Labs  Lab 10/29/19 0453 10/30/19 0506 10/31/19 0508  WBC 3.3* 2.5* 2.5*  HGB 12.6 12.2  12.2  HCT 37.3 36.3 37.0  PLT 179 167 177   Recent Labs  Lab 10/28/19 1456 10/29/19 0453 10/30/19 0506 10/31/19 0508  NA 136 137 137 138  K 3.8 3.7 3.9 3.9  CL 102 105 107 111  CO2 23 21* 20* 19*  BUN 6 <5* <5* <5*  CREATININE 0.69 0.66 0.78 0.70  CALCIUM 9.4 8.6* 8.7* 8.8*  PROT 8.4* 7.2  --   --   BILITOT 0.5 0.2*  --   --   ALKPHOS 86 78  --   --   ALT 15 15  --   --   AST 22 17  --   --   GLUCOSE 90 90 89 84   Cortisol (11/14): 11.7 within normal limits ACTH: Pending RPR: Reactive, Titre pending  Imaging/Diagnostic Tests: Dg Chest 2 View Result Date: 10/29/2019 CLINICAL DATA:  HIV EXAM: CHEST - 2 VIEW COMPARISON:  05/25/2018 FINDINGS: Heart is borderline in size. Low lung volumes. No confluent opacities or effusions. No acute bony abnormality. IMPRESSION: No active cardiopulmonary disease. Electronically Signed   By: Rolm Baptise M.D.   On: 10/29/2019 00:46   Ct Head Wo Contrast Result Date: 10/28/2019 CLINICAL DATA:  Headache. EXAM: CT HEAD WITHOUT CONTRAST TECHNIQUE: Contiguous axial images were obtained from the base of the skull through the vertex without intravenous contrast. COMPARISON:  CT dated 03/28/2014.  MRI head dated February 18, 2018 FINDINGS: Brain: There is a 6.6 x 4.2 cm area of vasogenic edema primarily involving the right temporal lobe. There is mild mass effect on the right lateral ventricle with a leftward midline shift measuring approximately 3-4 mm. There is no acute intracranial hemorrhage. Vascular: No hyperdense vessel or unexpected calcification. Skull: Normal. Negative for fracture or focal lesion. Sinuses/Orbits: No acute finding. Other: None. IMPRESSION: Large area of vasogenic edema primarily involving the right temporal lobe. There is mass effect and a leftward midline shift measuring approximately 3-4 mm. Follow-up with a contrast-enhanced MRI is recommended for evaluation of an underlying mass or infectious process.  [name]  Contrast IMPRESSION: 1. 2.4 x 1.9 x 2.4 cm cortically based rim enhancing lesion involving the right temporal lobe with associated scattered leptomeningeal and dural enhancement within the adjacent right frontotemporal region. Associated vasogenic edema with up to 4 mm of right-to-left midline shift. Given the history of HIV, primary differential consideration consists of intracranial infection with meningitis and localized cerebritis, including opportunistic infections such as TB or toxoplasmosis. No frank restricted diffusion to suggest intracerebral abscess. Correlation with CSF fluid analysis recommended. Sequelae of IRIS could also be considered in the correct clinical setting. High-grade glioma could also have this appearance, although is felt to be less likely and should only be considered if alternate etiologies are excluded. 2. Underlying mildly advanced generalized cerebral volume loss for age, which could reflect the sequelae of HIV encephalitis.    Gladys Damme, MD 10/31/2019, 9:09 AM PGY-1, [place] Intern pager: [phone], text pages welcome

## 2019-11-01 LAB — BASIC METABOLIC PANEL
Anion gap: 11 (ref 5–15)
BUN: <5 mg/dL — ABNORMAL LOW (ref 6–20)
CO2: 18 mmol/L — ABNORMAL LOW (ref 22–32)
Calcium: 9.1 mg/dL (ref 8.9–10.3)
Chloride: 107 mmol/L (ref 98–111)
Creatinine, Ser: 0.79 mg/dL (ref 0.44–1.00)
GFR calc Af Amer: >60 mL/min (ref >60)
GFR calc non Af Amer: >60 mL/min (ref >60)
Glucose, Bld: 92 mg/dL (ref 70–99)
Potassium: 3.8 mmol/L (ref 3.5–5.1)
Sodium: 136 mmol/L (ref 135–145)

## 2019-11-01 LAB — CBC
HCT: 35 % — ABNORMAL LOW (ref 36.0–46.0)
Hemoglobin: 12 g/dL (ref 12.0–15.0)
MCH: 31.8 pg (ref 26.0–34.0)
MCHC: 34.3 g/dL (ref 30.0–36.0)
MCV: 92.8 fL (ref 80.0–100.0)
Platelets: 178 10*3/uL (ref 150–400)
RBC: 3.77 MIL/uL — ABNORMAL LOW (ref 3.87–5.11)
RDW: 12.8 % (ref 11.5–15.5)
WBC: 2.6 10*3/uL — ABNORMAL LOW (ref 4.0–10.5)
nRBC: 0 % (ref 0.0–0.2)

## 2019-11-01 LAB — HIV-1 RNA QUANT-NO REFLEX-BLD
HIV 1 RNA Quant: 137000 copies/mL
LOG10 HIV-1 RNA: 5.137 log10copy/mL

## 2019-11-01 MED ORDER — FLUCONAZOLE 200 MG PO TABS
200.0000 mg | ORAL_TABLET | Freq: Every day | ORAL | Status: AC
  Administered 2019-11-01 – 2019-11-07 (×7): 200 mg via ORAL
  Filled 2019-11-01 (×7): qty 1

## 2019-11-01 MED ORDER — KETOROLAC TROMETHAMINE 15 MG/ML IJ SOLN: 15.0000 mg | Freq: Four times a day (QID) | INTRAMUSCULAR | Status: AC | PRN

## 2019-11-01 NOTE — Evaluation (Signed)
Occupational Therapy Evaluation Patient Details Name: Zamantha Ziller MRN: [id number] DOB: [birth year] Today's Date: 11/01/2019    History of Present Illness Kasen Bih [name] is a 28 y.o. female presenting with headache and back pain x 2weeks . PMH is significant for HIV, meningitis, syphilis, Bell's palsy, primary adrenal insufficiency, tuberculosis, herpes simplex esophagitis.Pt found to have ring lesions on the brain indicative of cerebral edema, mainly in R temporal region.   Clinical Impression   Pt admitted with above diagnoses. PTA living with spouse and independent. Was working at [employer] but has been out since September 2/2 back pain and now recurring HA. At time of eval pt demonstrates ability to complete transfers, mobility, and BADL at mod I- independent level. Tested cognition without significant note of impairment. A&O x4. Pt does endorse occasionally forgetting some things, but states this is not new. Recommended pt use compensatory methods such as planners, lists, etc. She was able to complete trail making with x2 cues to find room. She shares that she is in this situation 2/2 not taking her HIV meds as she should. She states this is not due to forgetfulness, but the fact that she does not like to take pills and that they make her sick. Encouraged pt to get pill organizer and work on implementing this in daily routine to maintain health and safety. Recommended med supervision to help accountability. Evaluation only, no further OT needed. No f/u OT indicated. Will sign off, please re consult if changes arise. Thank you for this consult.     Follow Up Recommendations  No OT follow up;Other (comment)(supervision for encouragement to take meds)    Equipment Recommendations  None recommended by OT    Recommendations for Other Services       Precautions / Restrictions Precautions Precautions: Other (comment) Precaution Comments: pt reports light sensitivity Restrictions Weight Bearing  Restrictions: No      Mobility Bed Mobility Overal bed mobility: Independent             General bed mobility comments: no difficulty with HOB flat  Transfers Overall transfer level: Independent Equipment used: None             General transfer comment: pt taking herself to/from bathroom and unplugging IV pole    Balance Overall balance assessment: No apparent balance deficits (not formally assessed)                                         ADL either performed or assessed with clinical judgement   ADL Overall ADL's : Modified independent;At baseline                                       General ADL Comments: pt showing ability to complete BADL at mod I level. Going to BR by herself, navigating environment at [place] Vision/History: No visual deficits Patient Visual Report: Other (comment)(light sensitivity) Vision Assessment?: No apparent visual deficits Additional Comments: reports light sensitivity, but pt vision functional throughout session     Perception     Praxis      Pertinent Vitals/Pain Pain Assessment: Faces Faces Pain Scale: Hurts a little bit Pain Location: headache, front of head Pain Descriptors / Indicators: Pressure Pain Intervention(s): Monitored during session     Hand  Dominance Right   Extremity/Trunk Assessment Upper Extremity Assessment Upper Extremity Assessment: Overall WFL for tasks assessed   Lower Extremity Assessment Lower Extremity Assessment: Overall WFL for tasks assessed       Communication Communication Communication: No difficulties   Cognition Arousal/Alertness: Awake/alert Behavior During Therapy: WFL for tasks assessed/performed Overall Cognitive Status: Within Functional Limits for tasks assessed                                 General Comments: pt able to complete trail making, divide attention and discuss medicaiton management  apporpriately with OT   General Comments       Exercises     Shoulder Instructions      Home Living Family/patient expects to be discharged to:: Private residence Living Arrangements: Spouse/significant other Available Help at Discharge: Family;Available PRN/intermittently Type of Home: House Home Access: Stairs to enter [employer] of Steps: 2 Entrance Stairs-Rails: Right Home Layout: One level     Bathroom Shower/Tub: Teacher, early years/pre: Standard     Home Equipment: None          Prior Functioning/Environment Level of Independence: Independent        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        OT Problem List: Decreased activity tolerance;Impaired balance (sitting and/or standing);Decreased cognition;Pain      OT Treatment/Interventions:      OT Goals(Current goals can be found in the care plan section) Acute Rehab OT Goals Patient Stated Goal: to go home OT Goal Formulation: With patient Time For Goal Achievement: 11/15/19 Potential to Achieve Goals: Good  OT Frequency:     Barriers to D/C:            Co-evaluation              AM-PAC OT "6 Clicks" Daily Activity     Outcome Measure Help from another person eating meals?: None Help from another person taking care of personal grooming?: None Help from another person toileting, which includes using toliet, bedpan, or urinal?: None Help from another person bathing (including washing, rinsing, drying)?: None Help from another person to put on and taking off regular upper body clothing?: None Help from another person to put on and taking off regular lower body clothing?: None 6 Click Score: 24   End of Session Nurse Communication: Mobility status  Activity Tolerance: Patient tolerated treatment well Patient left: in chair;with call bell/phone within reach  OT Visit Diagnosis: Other symptoms and signs  involving the nervous system [id number])                Time: [id number] OT Time Calculation (min): 20 min Charges:  OT General Charges $OT Visit: 1 Visit OT Evaluation $OT Eval Low Complexity: 1 Low  Zenovia Jarred, MSOT, OTR/L [place] OT/ Acute Relief OT [facility] Office: [place] 11/01/2019, 3:23 PM

## 2019-11-01 NOTE — Plan of Care (Signed)

## 2019-11-01 NOTE — Progress Notes (Signed)
OT Cancellation Note  Patient Details Name: Kelsey Rodriguez MRN: [id number] DOB: 05/28/1991   Cancelled Treatment:    Reason Eval/Treat Not Completed: Other (comment) Pt refused this AM 2/2 fatigue and nausea. Will check back as schedule permits to initiate OT POC.  Zenovia Jarred, MSOT, OTR/L [place] OT/ Acute Relief OT [facility] Office: [place] 11/01/2019, 1:32 PM

## 2019-11-01 NOTE — Evaluation (Signed)
Physical Therapy Evaluation and DISCHARGE Patient Details Name: Kelsey Rodriguez MRN: [id number] DOB: [birth year] Today's Date: 11/01/2019   History of Present Illness  Kelsey Rodriguez is a 28 y.o. female presenting with headache and back pain x 2weeks . PMH is significant for HIV, meningitis, syphilis, Bell's palsy, primary adrenal insufficiency, tuberculosis, herpes simplex esophagitis.Pt found to have ring lesions on the brain indicative of cerebral edema, mainly in R temporal region.    Clinical Impression  Pt admitted with above. Despite headache pt functioning independently. Pt taking self to/from bathroom in room. Pt with good home set up. Pt c/o mild headache at 3/10 and nausea. Pt reports mild dizziness as well.  Pt received vomiting at EOB. Pt denies change in vision but reports light sensitivity. Pt with no further acute PT needs at this time. ACUTE PT SIGNING OFF. Please re-consult if needed in future.    Follow Up Recommendations No PT follow up    Equipment Recommendations  None recommended by PT    Recommendations for Other Services       Precautions / Restrictions Precautions Precautions: Other (comment) Precaution Comments: pt reports light sensitivity Restrictions Weight Bearing Restrictions: No      Mobility  Bed Mobility Overal bed mobility: Independent             General bed mobility comments: no difficulty with HOB flat  Transfers Overall transfer level: Independent Equipment used: None             General transfer comment: pt taking herself to/from bathroom and unplugging IV pole  Ambulation/Gait Ambulation/Gait assistance: Modified independent (Device/Increase time) Gait Distance (Feet): 150 Feet Assistive device: None Gait Pattern/deviations: Step-through pattern;Antalgic   Gait velocity interpretation: 1.31 - 2.62 ft/sec, indicative of limited community ambulator General Gait Details: mildly antalgic, pt reports she's had bilat hip  replacements, no episodes of LOB, mildly decreased pace  Stairs            Wheelchair Mobility    Modified Rankin (Stroke Patients Only)       Balance Overall balance assessment: No apparent balance deficits (not formally assessed)                                           Pertinent Vitals/Pain Pain Assessment: 0-10 Pain Score: 3  Pain Location: headache, front of head Pain Descriptors / Indicators: Pressure Pain Intervention(s): Monitored during session    Home Living Family/patient expects to be discharged to:: Private residence Living Arrangements: Spouse/significant other Available Help at Discharge: Family;Available PRN/intermittently(spouse works) Type of Home: House Home Access: Stairs to enter Entrance Stairs-Rails: Building surveyor of Steps: 2 Home Layout: One level Home Equipment: None      Prior Function Level of Independence: Independent         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     Hand Dominance   Dominant Hand: Right    Extremity/Trunk Assessment   Upper Extremity Assessment Upper Extremity Assessment: Overall WFL for tasks assessed    Lower Extremity Assessment Lower Extremity Assessment: Overall WFL for tasks assessed    Cervical / Trunk Assessment Cervical / Trunk Assessment: Normal  Communication   Communication: No difficulties  Cognition Arousal/Alertness: Awake/alert Behavior During Therapy: WFL for tasks assessed/performed Overall Cognitive Status: Within Functional Limits for tasks assessed  General Comments General comments (skin integrity, edema, etc.): VSS, pt gave nausea medicine prior to session    Exercises     Assessment/Plan    PT Assessment Patent does not need any further PT services  PT Problem List         PT Treatment Interventions      PT  Goals (Current goals can be found in the Care Plan section)  Acute Rehab PT Goals PT Goal Formulation: All assessment and education complete, DC therapy    Frequency     Barriers to discharge        Co-evaluation               AM-PAC PT "6 Clicks" Mobility  Outcome Measure Help needed turning from your back to your side while in a flat bed without using bedrails?: None Help needed moving from lying on your back to sitting on the side of a flat bed without using bedrails?: None Help needed moving to and from a bed to a chair (including a wheelchair)?: None Help needed standing up from a chair using your arms (e.g., wheelchair or bedside chair)?: None Help needed to walk in hospital room?: None Help needed climbing 3-5 steps with a railing? : None 6 Click Score: 24    End of Session   Activity Tolerance: Patient tolerated treatment well Patient left: in chair;with call bell/phone within reach Nurse Communication: Mobility status PT Visit Diagnosis: Unsteadiness on feet (R26.81);Pain Pain - part of body: (headache)    Time: [id number] PT Time Calculation (min) (ACUTE ONLY): 22 min   Charges:   PT Evaluation $PT Eval Low Complexity: 1 Low          Kittie Plater, PT, DPT Acute Rehabilitation Services Pager #: [phone] Office #: [phone]   [place] 11/01/2019, 8:17 AM

## 2019-11-01 NOTE — Progress Notes (Signed)
    [facility] for Infectious Disease    Date of Admission:  10/28/2019      ID: Newt Minion Bih [name] is a 28 y.o. female with HIV/AIDS and cns toxo Active Problems:   Cerebral edema (HCC)    Subjective: Afebrile, slept poorly but was able to sleep this morning, now feels good  Medications:  . enoxaparin (LOVENOX) injection  40 mg Subcutaneous Q24H  . fluconazole  200 mg Oral Daily   Ros: +nausea, headache Objective: Vital signs in last 24 hours: Temp:  [97.4 F (36.3 C)-98.5 F (36.9 C)] 98.3 F (36.8 C) (11/17 1238) Pulse Rate:  [70-75] 70 (11/17 0700) Resp:  [18] 18 (11/17 1238) BP: (94-123)/(66-92) 99/66 (11/17 1238) SpO2:  [100 %] 100 % (11/17 1238) Physical Exam  Constitutional:  oriented to person, place, and time. appears well-developed and well-nourished. No distress.  HENT: [place]/AT, PERRLA, no scleral icterus Mouth/Throat: Oropharynx is clear and moist. No oropharyngeal exudate.  Cardiovascular: Normal rate, regular rhythm and normal heart sounds. Exam reveals no gallop and no friction rub.  No murmur heard.  Pulmonary/Chest: Effort normal and breath sounds normal. No respiratory distress.  has no wheezes.  Neck = supple, no nuchal rigidity Abdominal: Soft. Bowel sounds are normal.  exhibits no distension. There is no tenderness.  Lymphadenopathy: no cervical adenopathy. No axillary adenopathy Neurological: alert and oriented to person, place, and time.  Skin: Skin is warm and dry. No rash noted. No erythema.  Psychiatric: a normal mood and affect.  behavior is normal.    Lab Results Recent Labs    10/31/19 0508 11/01/19 0505  WBC 2.5* 2.6*  HGB 12.2 12.0  HCT 37.0 35.0*  NA 138 136  K 3.9 3.8  CL 111 107  CO2 19* 18*  BUN <5* <5*  CREATININE 0.70 0.79    Microbiology: RPR = pending titer toxo IG G + CSF Studies/Results: No results found.   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 Awaiting second agent to come tomorrow.  Headache likely related, treat symptoms  Decreased appetite = can consider appetite stimulant - marinol 10mg  bID   HIV/AIDS = holding ART for the time being while being treated for CNS toxo  Leukopenia = due to advanced hiv disease  [facility] for Infectious Diseases Cell: [phone] Pager: [phone]  11/01/2019, 3:44 PM

## 2019-11-01 NOTE — Progress Notes (Addendum)
Family Medicine Teaching Service Daily Progress Note Intern Pager: [phone]  Kelsey Rodriguez name: Kelsey Rodriguez Medical record number: [id number] Date of birth: 05/01/1991 Age: 28 y.o. Gender: female  Primary Care Provider: Gerlene Fee, DO Consultants: Infectious Disease Code Status: Full  Pt Overview and Major Events to Date:  10/28/2019: Admitted, rim enhancing lesion in R temporal lobe 11/01/2019: CD4 65  Assessment and Plan: Kelsey Rodriguez a 28 y.o.femalepresenting with headache and back pain x 2weeks. PMH is significant for HIV, meningitis, syphilis, Bell's palsy, primary adrenal insufficiency,tuberculosis,herpessimplexesophagitis.  Headache/CNS lesion with mass-effect Pt has diminished alertness this morning s/p promethazine dose. She is oriented x4, no focal neurologic deficits, but falling asleep while speaking to medical providers, which is not her baseline. Could be due to antihistamine effects of promethazine, will continue to monitor closely. VSS, normotensive BP. CD4 count 65, HIV 1 RNA quant 137,000, T pallidum ab non reactive. 2.4 x 1.9 x 2.4 cortically based rim-enhancing lesion in the right temporal lobe, associated 4 mm right to left midline shift as seen on MRI brain.  Likely secondary to Kelsey Rodriguez's severe immunosuppression due to her HIV and subsequent AIDS. 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    - Infectious disease following, appreciate their recommendations - Neurology to evaluate for potential LP - state Kelsey Rodriguez is not a candidate for LP due to mass-effect; neurology will look into further options for evaluation/treatment - Continue Bactrim, dosing per pharmacy - Plan to start on sulfadiazine plus pyrimethamine plus leucovorin as those medications become available. - Continue Tylenol for pain control - Toradol for pain control - Neurochecks every 4 hours - Vital signs per floor routine - Lovenox for DVT prophylaxis  Oral Candidiasis:   Kelsey Rodriguez with CD4 65, known HIV/AIDS, with new white exudate that is easily scrapable in oropharynx. Diflucan started. - Continue diflucan 200 mg daily  Nausea: Improved today, ate some apple sauce. Received promethazine this morning. Could be d/t to Bactrim with added on Oxy for pain. -Zofran 8mg  q6 PRN -EKG 11/15 without QTc changes  HIV/AIDS CD4 count of 65, HIV 1 RNA quant 137,000.  Kelsey Rodriguez with history of noncompliance of Symtuza. Still awaiting numerous laboratory findings to result. -Holding Symtuza per ID  Primary adrenal insufficiency/history of avascular necrosis bilateral hips A.m. cortisol level 11.7, within normal limits.  ACTH WNL. Kelsey Rodriguez was previously on steroids, but this was stopped due to avascular necrosis.  Kelsey Rodriguez does have bilateral hip replacements.  History of tuberculosis CXR w/o active cardiopulmonary disease on admission.  Tuberculosis is on differential for possibility of rim-enhancing lesion in brain.  No active lung manifestations, would not be considered contagious at this point.  We will follow-up testing for mycoplasma. -Continue to monitor for symptoms  FEN/GI: Regular diet PPx: SCDs  Disposition: Continue care on progressive unit  Subjective:  This morning Kelsey Rodriguez had decreased alertness, was falling asleep while talking to this provider.  This represents a significant change from her baseline alertness, however she had just received promethazine for nausea.  Spoke with physical therapist to adjust work with the Kelsey Rodriguez and she was at her baseline alertness, having normal discussion, not fully asleep while talking to provider.  After Kelsey Rodriguez received her promethazin, PT reportedly went and got her some applesauce and by the time she had return from nutrition room Kelsey Rodriguez was already falling asleep.  She is oriented x4, will follow commands, but her level of alertness is diminished.  She stated that she had headaches at night and did  not sleep well and  then got the promethazine.  Hopefully this is just a side effect of the antihistamine and promethazine, but we will monitor very closely.  She otherwise denies any loss of sensation in her limbs or face; denies any weaknesses or focal neurological deficits.  Objective: Temp:  [98.1 F (36.7 C)-98.9 F (37.2 C)] 98.3 F (36.8 C) (11/17 0341) Pulse Rate:  [70-77] 72 (11/17 0341) Resp:  [16] 16 (11/16 1150) BP: (94-123)/(66-92) 102/67 (11/17 0341) SpO2:  [100 %] 100 % (11/17 0341) Physical Exam: General: Pleasant Kelsey Rodriguez, nontoxic appearing, no apparent distress HEENT: PERRLA, EOMI, oropharynx grossly covered with white exudate, easily scrapable Cardiovascular: RRR, S1-S2 present, no murmurs appreciated Respiratory: CTA bilaterally, no adventitious sounds appreciated, normal work of breathing Abdomen: Soft, nontender, normal bowel sounds appreciated Extremities: No deformity, cyanosis, or edema Neuro: Alertness greatly diminished as pt falling asleep during exam, oriented x4, no focal neurological deficits appreciated, Kelsey Rodriguez able to move all limbs spontaneously; can follow instructions; sensation intact to bilateral upper and lower extremities.  Laboratory: Recent Labs  Lab 10/30/19 0506 10/31/19 0508 11/01/19 0505  WBC 2.5* 2.5* 2.6*  HGB 12.2 12.2 12.0  HCT 36.3 37.0 35.0*  PLT 167 177 178   Recent Labs  Lab 10/28/19 1456 10/29/19 0453 10/30/19 0506 10/31/19 0508 11/01/19 0505  NA 136 137 137 138 136  K 3.8 3.7 3.9 3.9 3.8  CL 102 105 107 111 107  CO2 23 21* 20* 19* 18*  BUN 6 <5* <5* <5* PENDING  CREATININE 0.69 0.66 0.78 0.70 0.79  CALCIUM 9.4 8.6* 8.7* 8.8* 9.1  PROT 8.4* 7.2  --   --   --   BILITOT 0.5 0.2*  --   --   --   ALKPHOS 86 78  --   --   --   ALT 15 15  --   --   --   AST 22 17  --   --   --   GLUCOSE 90 90 89 84 92   Cortisol (11/14): 11.7 within normal limits ACTH: Pending RPR: Reactive, Titre pending  Imaging/Diagnostic Tests: Dg Chest 2  View Result Date: 10/29/2019 CLINICAL DATA:  HIV EXAM: CHEST - 2 VIEW COMPARISON:  05/25/2018 FINDINGS: Heart is borderline in size. Low lung volumes. No confluent opacities or effusions. No acute bony abnormality. IMPRESSION: No active cardiopulmonary disease. Electronically Signed   By: Rolm Baptise M.D.   On: 10/29/2019 00:46   Ct Head Wo Contrast Result Date: 10/28/2019 CLINICAL DATA:  Headache. EXAM: CT HEAD WITHOUT CONTRAST TECHNIQUE: Contiguous axial images were obtained from the base of the skull through the vertex without intravenous contrast. COMPARISON:  CT dated 03/28/2014.  MRI head dated February 18, 2018 FINDINGS: Brain: There is a 6.6 x 4.2 cm area of vasogenic edema primarily involving the right temporal lobe. There is mild mass effect on the right lateral ventricle with a leftward midline shift measuring approximately 3-4 mm. There is no acute intracranial hemorrhage. Vascular: No hyperdense vessel or unexpected calcification. Skull: Normal. Negative for fracture or focal lesion. Sinuses/Orbits: No acute finding. Other: None. IMPRESSION: Large area of vasogenic edema primarily involving the right temporal lobe. There is mass effect and a leftward midline shift measuring approximately 3-4 mm. Follow-up with a contrast-enhanced MRI is recommended for evaluation of an underlying mass or infectious process.  [name] Contrast IMPRESSION: 1. 2.4 x 1.9 x 2.4 cm cortically based rim enhancing lesion involving the right temporal lobe  with associated scattered leptomeningeal and dural enhancement within the adjacent right frontotemporal region. Associated vasogenic edema with up to 4 mm of right-to-left midline shift. Given the history of HIV, primary differential consideration consists of intracranial infection with meningitis and localized cerebritis, including opportunistic infections such as TB or toxoplasmosis. No frank restricted diffusion to suggest intracerebral abscess. Correlation  with CSF fluid analysis recommended. Sequelae of IRIS could also be considered in the correct clinical setting. High-grade glioma could also have this appearance, although is felt to be less likely and should only be considered if alternate etiologies are excluded. 2. Underlying mildly advanced generalized cerebral volume loss for age, which could reflect the sequelae of HIV encephalitis.    Gladys Damme, MD 11/01/2019, 6:03 AM PGY-1, [place] Intern pager: [phone], text pages welcome

## 2019-11-02 LAB — BASIC METABOLIC PANEL
Anion gap: 10 (ref 5–15)
BUN: 5 mg/dL — ABNORMAL LOW (ref 6–20)
CO2: 20 mmol/L — ABNORMAL LOW (ref 22–32)
Calcium: 9.1 mg/dL (ref 8.9–10.3)
Chloride: 106 mmol/L (ref 98–111)
Creatinine, Ser: 0.73 mg/dL (ref 0.44–1.00)
GFR calc Af Amer: >60 mL/min (ref >60)
GFR calc non Af Amer: >60 mL/min (ref >60)
Glucose, Bld: 86 mg/dL (ref 70–99)
Potassium: 4.1 mmol/L (ref 3.5–5.1)
Sodium: 136 mmol/L (ref 135–145)

## 2019-11-02 LAB — CULTURE, BLOOD (ROUTINE X 2)
Culture: NO GROWTH
Culture: NO GROWTH
Special Requests: ADEQUATE
Special Requests: ADEQUATE

## 2019-11-02 MED ORDER — SODIUM CHLORIDE 0.9% FLUSH
10.0000 mL | Freq: Two times a day (BID) | INTRAVENOUS | Status: DC
  Administered 2019-11-05 – 2019-11-08 (×7): 10 mL

## 2019-11-02 MED ORDER — SODIUM CHLORIDE 0.9% FLUSH: 10.0000 mL | INTRAVENOUS | Status: DC | PRN

## 2019-11-02 NOTE — Plan of Care (Signed)

## 2019-11-02 NOTE — Progress Notes (Signed)
    [facility] for Infectious Disease    Date of Admission:  10/28/2019   Total days of antibiotics 5           ID: Kelsey Rodriguez is a 28 y.o. female with HIV disease, presumed CNS toxo Active Problems:   Cerebral edema (HCC)    Subjective: Improved headache, still poor appetite  Medications:  . enoxaparin (LOVENOX) injection  40 mg Subcutaneous Q24H  . fluconazole  200 mg Oral Daily  . sodium chloride flush  10-40 mL Intracatheter Q12H    Objective: Vital signs in last 24 hours: Temp:  [98 F (36.7 C)-98.4 F (36.9 C)] 98.4 F (36.9 C) (11/18 1632) Pulse Rate:  [67-74] 69 (11/18 1632) Resp:  [16] 16 (11/18 1632) BP: (95-113)/(64-80) 113/72 (11/18 1632) SpO2:  [98 %-100 %] 100 % (11/18 1632) Weight:  [82.4 kg] 82.4 kg (11/18 1625) Physical Exam  Constitutional:  oriented to person, place, and time. appears well-developed and well-nourished. No distress.  HENT: [place]/AT, PERRLA, no scleral icterus Mouth/Throat: Oropharynx is clear and moist. No oropharyngeal exudate.  Cardiovascular: Normal rate, regular rhythm and normal heart sounds. Exam reveals no gallop and no friction rub.  No murmur heard.  Pulmonary/Chest: Effort normal and breath sounds normal. No respiratory distress.  has no wheezes.  Neck = supple, no nuchal rigidity Abdominal: Soft. Bowel sounds are normal.  exhibits no distension. There is no tenderness.  Lymphadenopathy: no cervical adenopathy. No axillary adenopathy Neurological: alert and oriented to person, place, and time.  Skin: Skin is warm and dry. No rash noted. No erythema.  Psychiatric: a normal mood and affect.  behavior is normal.   Lab Results Recent Labs    10/31/19 0508 11/01/19 0505 11/02/19 0318  WBC 2.5* 2.6*  --   HGB 12.2 12.0  --   HCT 37.0 35.0*  --   NA 138 136 136  K 3.9 3.8 4.1  CL 111 107 106  CO2 19* 18* 20*  BUN <5* <5* 5*  CREATININE 0.70 0.79 0.73   Liver Panel No results for input(s): PROT, ALBUMIN, AST,  ALT, ALKPHOS, BILITOT, BILIDIR, IBILI in the last 72 hours. Sedimentation Rate No results for input(s): ESRSEDRATE in the last 72 hours. C-Reactive Protein No results for input(s): CRP in the last 72 hours.  Microbiology: reviewed Studies/Results: No results found.   Assessment/Plan: CNS toxo = continue on iv bactirm for now. pyrimethamine to arrive tomorrow to change for treatment Appetite stimulant = start marinol to see if improvement oi proph = continue with fluconazole for thrush hiv disease= ART on hold temporarily while initiation of CNS toxo treatment  [facility] Cell: [phone] Pager: [phone]  11/02/2019, 9:49 PM

## 2019-11-02 NOTE — Progress Notes (Signed)
Family Medicine Teaching Service Daily Progress Note Intern Pager: [phone]  Patient name: Kelsey Rodriguez Medical record number: [id number] Date of birth: September 18, 1991 Age: 28 y.o. Gender: female  Primary Care Provider: Gerlene Fee, DO Consultants: Infectious Disease Code Status: Full  Pt Overview and Major Events to Date:  10/28/2019: Admitted, rim enhancing lesion in R temporal lobe 11/01/2019: CD4 65  Assessment and Plan: [name] a 28 y.o.femalepresenting with headache and back pain x 2weeks. PMH is significant for HIV, meningitis, syphilis, Bell's palsy, primary adrenal insufficiency,tuberculosis,herpessimplexesophagitis.  Headache/CNS lesion with mass-effect Medications due to arrive either today, or Thursday. VSS, BP normotensive. CD4 count 65, HIV 1 RNA quant 137,000, T pallidum ab non reactive. 2.4 x 1.9 x 2.4 cortically based rim-enhancing lesion in the right temporal lobe, associated 4 mm right to left midline shift as seen on MRI brain.  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 Will discuss implications of HIV on family planning. - Infectious disease following, appreciate their recommendations - Neurology to evaluate for potential LP - state patient is not a candidate for LP due to mass-effect; neurology will look into further options for evaluation/treatment - Continue Bactrim, dosing per pharmacy - Plan to start on sulfadiazine plus pyrimethamine plus leucovorin as those medications become available. - Continue Tylenol for pain control - Toradol for pain control - Neurochecks every 4 hours - Vital signs per floor routine - Lovenox for DVT prophylaxis  Oral Candidiasis:  Patient with CD4 65, known HIV/AIDS, with new white exudate that is easily scrapable in oropharynx. Diflucan started. - Continue diflucan 200 mg daily  Nausea:  Could be d/t to  Bactrim with added on Oxy for pain. Nausea improved today, will get weight today to check if she's lost a significant amount since decreased appetite due to nausea for several days. -Zofran 8mg  q6 PRN -EKG 11/15 without QTc changes -Get new weight today  HIV/AIDS CD4 count of 65, HIV 1 RNA quant 137,000.  Patient with history of noncompliance of Symtuza. Still awaiting numerous laboratory findings to result. -Holding Symtuza per ID  Primary adrenal insufficiency/history of avascular necrosis bilateral hips A.m. cortisol level 11.7, within normal limits.  ACTH WNL. Patient was previously on steroids, but this was stopped due to avascular necrosis.  Patient does have bilateral hip replacements.  History of tuberculosis CXR w/o active cardiopulmonary disease on admission.  Tuberculosis is on differential for possibility of rim-enhancing lesion in brain.  No active lung manifestations, would not be considered contagious at this point.  We will follow-up testing for mycoplasma. -Continue to monitor for symptoms  FEN/GI: Regular diet PPx: SCDs  Disposition: Continue care on progressive unit  Subjective:  Pt reports decreased head ache, she slept better last night, and nausea improved. She was able to eat fruit today.   Objective: Temp:  [98.3 F (36.8 C)-98.5 F (36.9 C)] 98.3 F (36.8 C) (11/18 0323) Pulse Rate:  [69-79] 69 (11/18 0323) Resp:  [18] 18 (11/17 1238) BP: (95-118)/(64-77) 95/64 (11/18 0323) SpO2:  [100 %] 100 % (11/17 2318) Physical Exam: General: Pleasant patient, nontoxic appearing, no apparent distress HEENT: PERRLA, EOMI, oropharynx grossly covered with white exudate, easily scrapable Cardiovascular: RRR, S1-S2 present, no murmurs appreciated Respiratory: CTA bilaterally, no adventitious sounds appreciated, normal work of breathing Abdomen: Soft, nontender, normal bowel sounds appreciated Extremities: No deformity, cyanosis, or edema Neuro: Alertness greatly  diminished as pt falling asleep during  exam, oriented x4, no focal neurological deficits appreciated, patient able to move all limbs spontaneously; can follow instructions; sensation intact to bilateral upper and lower extremities.  Laboratory: Recent Labs  Lab 10/30/19 0506 10/31/19 0508 11/01/19 0505  WBC 2.5* 2.5* 2.6*  HGB 12.2 12.2 12.0  HCT 36.3 37.0 35.0*  PLT 167 177 178   Recent Labs  Lab 10/28/19 1456 10/29/19 0453  10/31/19 0508 11/01/19 0505 11/02/19 0318  NA 136 137   < > 138 136 136  K 3.8 3.7   < > 3.9 3.8 4.1  CL 102 105   < > 111 107 106  CO2 23 21*   < > 19* 18* 20*  BUN 6 <5*   < > <5* <5* 5*  CREATININE 0.69 0.66   < > 0.70 0.79 0.73  CALCIUM 9.4 8.6*   < > 8.8* 9.1 9.1  PROT 8.4* 7.2  --   --   --   --   BILITOT 0.5 0.2*  --   --   --   --   ALKPHOS 86 78  --   --   --   --   ALT 15 15  --   --   --   --   AST 22 17  --   --   --   --   GLUCOSE 90 90   < > 84 92 86   < > = values in this interval not displayed.   Cortisol (11/14): 11.7 within normal limits ACTH: WNL RPR: Reactive, Titre pending, T. Pallidum abs negative  Imaging/Diagnostic Tests: Dg Chest 2 View Result Date: 10/29/2019 CLINICAL DATA:  HIV EXAM: CHEST - 2 VIEW COMPARISON:  05/25/2018 FINDINGS: Heart is borderline in size. Low lung volumes. No confluent opacities or effusions. No acute bony abnormality. IMPRESSION: No active cardiopulmonary disease. Electronically Signed   By: Rolm Baptise M.D.   On: 10/29/2019 00:46   Ct Head Wo Contrast Result Date: 10/28/2019 CLINICAL DATA:  Headache. EXAM: CT HEAD WITHOUT CONTRAST TECHNIQUE: Contiguous axial images were obtained from the base of the skull through the vertex without intravenous contrast. COMPARISON:  CT dated 03/28/2014.  MRI head dated February 18, 2018 FINDINGS: Brain: There is a 6.6 x 4.2 cm area of vasogenic edema primarily involving the right temporal lobe. There is mild mass effect on the right lateral ventricle with a leftward  midline shift measuring approximately 3-4 mm. There is no acute intracranial hemorrhage. Vascular: No hyperdense vessel or unexpected calcification. Skull: Normal. Negative for fracture or focal lesion. Sinuses/Orbits: No acute finding. Other: None. IMPRESSION: Large area of vasogenic edema primarily involving the right temporal lobe. There is mass effect and a leftward midline shift measuring approximately 3-4 mm. Follow-up with a contrast-enhanced MRI is recommended for evaluation of an underlying mass or infectious process.  [name] Contrast IMPRESSION: 1. 2.4 x 1.9 x 2.4 cm cortically based rim enhancing lesion involving the right temporal lobe with associated scattered leptomeningeal and dural enhancement within the adjacent right frontotemporal region. Associated vasogenic edema with up to 4 mm of right-to-left midline shift. Given the history of HIV, primary differential consideration consists of intracranial infection with meningitis and localized cerebritis, including opportunistic infections such as TB or toxoplasmosis. No frank restricted diffusion to suggest intracerebral abscess. Correlation with CSF fluid analysis recommended. Sequelae of IRIS could also be considered in the correct clinical setting. High-grade glioma could also have this appearance, although is felt to be less  likely and should only be considered if alternate etiologies are excluded. 2. Underlying mildly advanced generalized cerebral volume loss for age, which could reflect the sequelae of HIV encephalitis.    Gladys Damme, MD 11/02/2019, 7:41 AM PGY-1, [place] Intern pager: [phone], text pages welcome

## 2019-11-03 LAB — BASIC METABOLIC PANEL
Anion gap: 11 (ref 5–15)
BUN: 5 mg/dL — ABNORMAL LOW (ref 6–20)
CO2: 20 mmol/L — ABNORMAL LOW (ref 22–32)
Calcium: 9.2 mg/dL (ref 8.9–10.3)
Chloride: 104 mmol/L (ref 98–111)
Creatinine, Ser: 1.02 mg/dL — ABNORMAL HIGH (ref 0.44–1.00)
GFR calc Af Amer: >60 mL/min (ref >60)
GFR calc non Af Amer: >60 mL/min (ref >60)
Glucose, Bld: 88 mg/dL (ref 70–99)
Potassium: 4.2 mmol/L (ref 3.5–5.1)
Sodium: 135 mmol/L (ref 135–145)

## 2019-11-03 MED ORDER — POLYETHYLENE GLYCOL 3350 17 GM/SCOOP PO POWD
1.0000 | Freq: Every day | ORAL | Status: DC
  Filled 2019-11-03: qty 255

## 2019-11-03 MED ORDER — SULFADIAZINE 500 MG PO TABS
1500.0000 mg | ORAL_TABLET | Freq: Four times a day (QID) | ORAL | Status: DC
  Administered 2019-11-03 – 2019-11-09 (×22): 1500 mg via ORAL
  Filled 2019-11-03 (×26): qty 3

## 2019-11-03 MED ORDER — DRONABINOL 2.5 MG PO CAPS
2.5000 mg | ORAL_CAPSULE | Freq: Two times a day (BID) | ORAL | Status: DC
  Administered 2019-11-03 (×2): 2.5 mg via ORAL
  Filled 2019-11-03 (×3): qty 1

## 2019-11-03 MED ORDER — PYRIMETHAMINE-LEUCOVORIN 50-25 MG PO CAPS
200.0000 mg | ORAL_CAPSULE | Freq: Once | ORAL | Status: AC
  Administered 2019-11-03: 200 mg via ORAL
  Filled 2019-11-03: qty 4

## 2019-11-03 MED ORDER — POLYETHYLENE GLYCOL 3350 17 G PO PACK
17.0000 g | PACK | Freq: Every day | ORAL | Status: DC
  Administered 2019-11-03 – 2019-11-08 (×6): 17 g via ORAL
  Filled 2019-11-03 (×7): qty 1

## 2019-11-03 MED ORDER — PYRIMETHAMINE-LEUCOVORIN 50-25 MG PO CAPS
50.0000 mg | ORAL_CAPSULE | Freq: Every day | ORAL | Status: DC
  Administered 2019-11-04 – 2019-11-07 (×4): 50 mg via ORAL
  Filled 2019-11-03 (×5): qty 1

## 2019-11-03 MED ORDER — PYRIMETHAMINE-LEUCOVORIN 25-5 MG PO CAPS
25.0000 mg | ORAL_CAPSULE | Freq: Every day | ORAL | Status: DC
  Administered 2019-11-04 – 2019-11-07 (×4): 25 mg via ORAL
  Filled 2019-11-03 (×6): qty 1

## 2019-11-03 NOTE — Progress Notes (Addendum)
Family Medicine Teaching Service Daily Progress Note Intern Pager: [phone]  Patient name: Kelsey Rodriguez Medical record number: [id number] Date of birth: [birth year] Age: 28 y.o. Gender: female  Primary Care Provider: Gerlene Fee, DO Consultants: Infectious Disease Code Status: Full  Pt Overview and Major Events to Date:  10/28/2019: Admitted, rim enhancing lesion in R temporal lobe 11/01/2019: CD4 65  Assessment and Plan: Kelsey Rodriguez a 28 y.o.femalepresenting with headache and back pain x 2weeks. PMH is significant for HIV, meningitis, syphilis, Bell's palsy, primary adrenal insufficiency,tuberculosis,herpessimplexesophagitis.  Headache/CNS lesion with mass-effect Medications due to arrive today. VSS, BP normotensive. CD4 count 65, HIV 1 RNA quant 137,000, T pallidum ab non reactive. 2.4 x 1.9 x 2.4 cortically based rim-enhancing lesion in the right temporal lobe, associated 4 mm right to left midline shift as seen on MRI brain.  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 Will discuss implications of HIV on family planning. - Infectious disease following, appreciate their recommendations - Neurology to evaluate for potential LP - state patient is not a candidate for LP due to mass-effect; neurology will look into further options for evaluation/treatment - Continue Bactrim, dosing per pharmacy - Plan to start on sulfadiazine plus pyrimethamine plus leucovorin as those medications become available. - Continue Tylenol for pain control - Neurochecks every 4 hours - Vital signs per floor routine - Lovenox for DVT prophylaxis  Oral Candidiasis:  Patient with CD4 65, known HIV/AIDS, with new white exudate that is easily scrapable in oropharynx. Diflucan started. - Continue diflucan 200 mg daily  Nausea:  Could be d/t to Bactrim with added on Oxy for pain. Some nausea and  vomiting with dizziness today, likely related to bactrim and also her space-occupying lesion in her brain. Hope this will improve with starting new medication today for toxo. Wt has minimally increased. Will try marinol 2.5 mg BID for nausea and appetite. -Marinol 2.5mg  PO BID 1hr priot to meals -Zofran 8mg  q6 PRN -EKG 11/15 without QTc changes  HIV/AIDS CD4 count of 65, HIV 1 RNA quant 137,000.  Patient with history of noncompliance of Symtuza. Still awaiting numerous laboratory findings to result. -Holding Symtuza per ID  Primary adrenal insufficiency/history of avascular necrosis bilateral hips A.m. cortisol level 11.7, within normal limits.  ACTH WNL. Patient was previously on steroids, but this was stopped due to avascular necrosis.  Patient does have bilateral hip replacements.  History of tuberculosis CXR w/o active cardiopulmonary disease on admission.  Tuberculosis is on differential for possibility of rim-enhancing lesion in brain.  No active lung manifestations, would not be considered contagious at this point.  We will follow-up testing for mycoplasma. -Continue to monitor for symptoms  FEN/GI: Regular diet PPx: SCDs  Disposition: Continue care on progressive unit  Subjective:  Pt reports mild head ache, she slept poorly last night. One episode of dizziness and vomiting this morning, feeling ok now. Requesting miralax.   Objective: Temp:  [98 F (36.7 C)-98.5 F (36.9 C)] 98.5 F (36.9 C) (11/19 0825) Pulse Rate:  [67-75] 75 (11/19 0802) Resp:  [16] 16 (11/19 0802) BP: (96-113)/(60-80) 103/61 (11/19 0802) SpO2:  [98 %-100 %] 99 % (11/19 0802) Weight:  [82.4 kg-84.3 kg] 84.3 kg (11/19 0500) Physical Exam: General: Pleasant patient, nontoxic appearing, no apparent distress HEENT: PERRLA, EOMI, oropharynx grossly covered with white exudate, easily scrapable Cardiovascular: RRR, S1-S2 present, no murmurs appreciated Respiratory: CTA bilaterally, no adventitious  sounds  appreciated, normal work of breathing Abdomen: Soft, nontender, normal bowel sounds appreciated Extremities: No deformity, cyanosis, or edema Neuro: Alertness greatly diminished as pt falling asleep during exam, oriented x4, no focal neurological deficits appreciated, patient able to move all limbs spontaneously; can follow instructions; sensation intact to bilateral upper and lower extremities.  Laboratory: Recent Labs  Lab 10/30/19 0506 10/31/19 0508 11/01/19 0505  WBC 2.5* 2.5* 2.6*  HGB 12.2 12.2 12.0  HCT 36.3 37.0 35.0*  PLT 167 177 178   Recent Labs  Lab 10/28/19 1456 10/29/19 0453  11/01/19 0505 11/02/19 0318 11/03/19 0507  NA 136 137   < > 136 136 135  K 3.8 3.7   < > 3.8 4.1 4.2  CL 102 105   < > 107 106 104  CO2 23 21*   < > 18* 20* 20*  BUN 6 <5*   < > <5* 5* 5*  CREATININE 0.69 0.66   < > 0.79 0.73 1.02*  CALCIUM 9.4 8.6*   < > 9.1 9.1 9.2  PROT 8.4* 7.2  --   --   --   --   BILITOT 0.5 0.2*  --   --   --   --   ALKPHOS 86 78  --   --   --   --   ALT 15 15  --   --   --   --   AST 22 17  --   --   --   --   GLUCOSE 90 90   < > 92 86 88   < > = values in this interval not displayed.   Cortisol (11/14): 11.7 within normal limits ACTH: WNL RPR: Reactive, Titre pending, T. Pallidum abs negative  Imaging/Diagnostic Tests: Dg Chest 2 View Result Date: 10/29/2019 CLINICAL DATA:  HIV EXAM: CHEST - 2 VIEW COMPARISON:  05/25/2018 FINDINGS: Heart is borderline in size. Low lung volumes. No confluent opacities or effusions. No acute bony abnormality. IMPRESSION: No active cardiopulmonary disease. Electronically Signed   By: Rolm Baptise M.D.   On: 10/29/2019 00:46   Ct Head Wo Contrast Result Date: 10/28/2019 CLINICAL DATA:  Headache. EXAM: CT HEAD WITHOUT CONTRAST TECHNIQUE: Contiguous axial images were obtained from the base of the skull through the vertex without intravenous contrast. COMPARISON:  CT dated 03/28/2014.  MRI head dated February 18, 2018 FINDINGS: Brain:  There is a 6.6 x 4.2 cm area of vasogenic edema primarily involving the right temporal lobe. There is mild mass effect on the right lateral ventricle with a leftward midline shift measuring approximately 3-4 mm. There is no acute intracranial hemorrhage. Vascular: No hyperdense vessel or unexpected calcification. Skull: Normal. Negative for fracture or focal lesion. Sinuses/Orbits: No acute finding. Other: None. IMPRESSION: Large area of vasogenic edema primarily involving the right temporal lobe. There is mass effect and a leftward midline shift measuring approximately 3-4 mm. Follow-up with a contrast-enhanced MRI is recommended for evaluation of an underlying mass or infectious process.  [name] Contrast IMPRESSION: 1. 2.4 x 1.9 x 2.4 cm cortically based rim enhancing lesion involving the right temporal lobe with associated scattered leptomeningeal and dural enhancement within the adjacent right frontotemporal region. Associated vasogenic edema with up to 4 mm of right-to-left midline shift. Given the history of HIV, primary differential consideration consists of intracranial infection with meningitis and localized cerebritis, including opportunistic infections such as TB or toxoplasmosis. No frank restricted diffusion to suggest intracerebral abscess. Correlation with  CSF fluid analysis recommended. Sequelae of IRIS could also be considered in the correct clinical setting. High-grade glioma could also have this appearance, although is felt to be less likely and should only be considered if alternate etiologies are excluded. 2. Underlying mildly advanced generalized cerebral volume loss for age, which could reflect the sequelae of HIV encephalitis.    Gladys Damme, MD 11/03/2019, 8:29 AM PGY-1, [place] Intern pager: [phone], text pages welcome

## 2019-11-04 LAB — BASIC METABOLIC PANEL
Anion gap: 11 (ref 5–15)
BUN: 6 mg/dL (ref 6–20)
CO2: 21 mmol/L — ABNORMAL LOW (ref 22–32)
Calcium: 9.4 mg/dL (ref 8.9–10.3)
Chloride: 102 mmol/L (ref 98–111)
Creatinine, Ser: 1.02 mg/dL — ABNORMAL HIGH (ref 0.44–1.00)
GFR calc Af Amer: >60 mL/min (ref >60)
GFR calc non Af Amer: >60 mL/min (ref >60)
Glucose, Bld: 84 mg/dL (ref 70–99)
Potassium: 4.5 mmol/L (ref 3.5–5.1)
Sodium: 134 mmol/L — ABNORMAL LOW (ref 135–145)

## 2019-11-04 MED ORDER — SULFAMETHOXAZOLE-TRIMETHOPRIM 800-160 MG PO TABS
1.0000 | ORAL_TABLET | Freq: Every day | ORAL | Status: DC
  Administered 2019-11-04 – 2019-11-09 (×6): 1 via ORAL
  Filled 2019-11-04 (×6): qty 1

## 2019-11-04 MED ORDER — DRONABINOL 2.5 MG PO CAPS
5.0000 mg | ORAL_CAPSULE | Freq: Two times a day (BID) | ORAL | Status: DC
  Administered 2019-11-04 – 2019-11-08 (×10): 5 mg via ORAL
  Filled 2019-11-04 (×10): qty 2

## 2019-11-04 NOTE — Progress Notes (Addendum)
Family Medicine Teaching Service Daily Progress Note Intern Pager: [phone]  Patient name: Kelsey Rodriguez Medical record number: [id number] Date of birth: [birth year] Age: 28 y.o. Gender: female  Primary Care Provider: Gerlene Fee, DO Consultants: Infectious Disease Code Status: Full  Pt Overview and Major Events to Date:  10/28/2019: Admitted, rim enhancing lesion in R temporal lobe 11/01/2019: CD4 65, HIV 1 RNA 137K, T. Pallidum abs neg 11/03/2019: Bactrim discontinued, Sulfadiazine, pyrimethamine-leucovorin, and marinol started  Assessment and Plan: Kelsey Rodriguez a 28 y.o.femalepresenting with headache and back pain x 2weeks. PMH is significant for HIV, meningitis, syphilis, Bell's palsy, primary adrenal insufficiency,tuberculosis,herpessimplexesophagitis.  Headache/CNS lesion with mass-effect Patient states that headache is minimal and she believes it is improving. Reports pain when chewing bilaterally, no evidence of infection in mouth. Possibly related to headache. Pt started sulfadiazine and pyrimethamine yesterday. VSS, BP normotensive.  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 Will discuss implications of HIV on family planning. Awaiting disposition planning per ID. - Infectious disease following, appreciate their recommendations - Continue on sulfadiazine plus pyrimethamine plus leucovorin  - Continue Tylenol for pain control - Neurochecks every 4 hours - Vital signs per floor routine - Lovenox for DVT prophylaxis  Oral Candidiasis:  Patient with CD4 65, known HIV/AIDS, white exudate in oropharynx gone today. -Continue diflucan for 7 total days, end on 11/23.  Nausea:  Some nausea present today, patient able to eat 1 piece of toast with tea. Bactrim discontinued <24h ago, likely related to bactrim and also her space-occupying lesion in her brain. Hope this will improve with starting new medication yesterday for toxo. Wt has minimally increased.  Will increase marinol to 5 mg BID for nausea and appetite. -Marinol 5mg  PO BID 1hr prior to meals -Zofran 8mg  q6 PRN -EKG 11/15 without QTc changes  HIV/AIDS CD4 count of 65, HIV 1 RNA quant 137,000.  Patient with history of noncompliance of Symtuza. Still awaiting numerous laboratory findings to result. -Holding Symtuza per ID  Primary adrenal insufficiency/history of avascular necrosis bilateral hips A.m. cortisol level 11.7, within normal limits.  ACTH WNL. Patient was previously on steroids, but this was stopped due to avascular necrosis.  Patient does have bilateral hip replacements.  History of tuberculosis CXR w/o active cardiopulmonary disease on admission.  Tuberculosis is on differential for possibility of rim-enhancing lesion in brain.  No active lung manifestations, would not be considered contagious at this point.  We will follow-up testing for mycoplasma. -Continue to monitor for symptoms  FEN/GI: Regular diet PPx: Lovenox  Disposition: Continue care on progressive unit pending disposition planning per ID  Subjective:  Patient reports new pain with chewing, it is a bilateral dull ache and happens when she opens her mouth and bites down. Oropharyngeal and dental exam unrevealing.  Objective: Temp:  [98 F (36.7 C)-98.7 F (37.1 C)] 98.7 F (37.1 C) (11/20 0102) Pulse Rate:  [70-86] 82 (11/20 0102) Resp:  [16-20] 20 (11/20 0102) BP: (91-103)/(51-66) 98/65 (11/20 0102) SpO2:  [96 %-100 %] 100 % (11/20 0102) Physical Exam: General: Pleasant patient, nontoxic appearing, no apparent distress HEENT: PERRLA, EOMI, oropharynx clear, no evidence of exudate or other infection Cardiovascular: RRR, S1-S2 present, no murmurs appreciated Respiratory: CTA bilaterally, no adventitious sounds appreciated, normal work of breathing Abdomen: Soft, nontender, normal bowel sounds appreciated Extremities: No deformity, cyanosis, or edema Neuro: AO x4, no focal neurological deficits  appreciated, patient able to move all limbs spontaneously; can follow instructions; sensation intact to bilateral upper and lower  extremities.  Laboratory: Recent Labs  Lab 10/30/19 0506 10/31/19 0508 11/01/19 0505  WBC 2.5* 2.5* 2.6*  HGB 12.2 12.2 12.0  HCT 36.3 37.0 35.0*  PLT 167 177 178   Recent Labs  Lab 10/28/19 1456 10/29/19 0453  11/01/19 0505 11/02/19 0318 11/03/19 0507  NA 136 137   < > 136 136 135  K 3.8 3.7   < > 3.8 4.1 4.2  CL 102 105   < > 107 106 104  CO2 23 21*   < > 18* 20* 20*  BUN 6 <5*   < > <5* 5* 5*  CREATININE 0.69 0.66   < > 0.79 0.73 1.02*  CALCIUM 9.4 8.6*   < > 9.1 9.1 9.2  PROT 8.4* 7.2  --   --   --   --   BILITOT 0.5 0.2*  --   --   --   --   ALKPHOS 86 78  --   --   --   --   ALT 15 15  --   --   --   --   AST 22 17  --   --   --   --   GLUCOSE 90 90   < > 92 86 88   < > = values in this interval not displayed.   Cortisol (11/14): 11.7 within normal limits ACTH: WNL RPR: Reactive, Titre pending, T. Pallidum abs negative  Imaging/Diagnostic Tests: Dg Chest 2 View Result Date: 10/29/2019 CLINICAL DATA:  HIV EXAM: CHEST - 2 VIEW COMPARISON:  05/25/2018 FINDINGS: Heart is borderline in size. Low lung volumes. No confluent opacities or effusions. No acute bony abnormality. IMPRESSION: No active cardiopulmonary disease. Electronically Signed   By: Rolm Baptise M.D.   On: 10/29/2019 00:46   Ct Head Wo Contrast Result Date: 10/28/2019 CLINICAL DATA:  Headache. EXAM: CT HEAD WITHOUT CONTRAST TECHNIQUE: Contiguous axial images were obtained from the base of the skull through the vertex without intravenous contrast. COMPARISON:  CT dated 03/28/2014.  MRI head dated February 18, 2018 FINDINGS: Brain: There is a 6.6 x 4.2 cm area of vasogenic edema primarily involving the right temporal lobe. There is mild mass effect on the right lateral ventricle with a leftward midline shift measuring approximately 3-4 mm. There is no acute intracranial hemorrhage.  Vascular: No hyperdense vessel or unexpected calcification. Skull: Normal. Negative for fracture or focal lesion. Sinuses/Orbits: No acute finding. Other: None. IMPRESSION: Large area of vasogenic edema primarily involving the right temporal lobe. There is mass effect and a leftward midline shift measuring approximately 3-4 mm. Follow-up with a contrast-enhanced MRI is recommended for evaluation of an underlying mass or infectious process.  [name] Contrast IMPRESSION: 1. 2.4 x 1.9 x 2.4 cm cortically based rim enhancing lesion involving the right temporal lobe with associated scattered leptomeningeal and dural enhancement within the adjacent right frontotemporal region. Associated vasogenic edema with up to 4 mm of right-to-left midline shift. Given the history of HIV, primary differential consideration consists of intracranial infection with meningitis and localized cerebritis, including opportunistic infections such as TB or toxoplasmosis. No frank restricted diffusion to suggest intracerebral abscess. Correlation with CSF fluid analysis recommended. Sequelae of IRIS could also be considered in the correct clinical setting. High-grade glioma could also have this appearance, although is felt to be less likely and should only be considered if alternate etiologies are excluded. 2. Underlying mildly advanced generalized cerebral volume loss for age, which could reflect the  sequelae of HIV encephalitis.    Gladys Damme, MD 11/04/2019, 6:03 AM PGY-1, [place] Intern pager: [phone], text pages welcome

## 2019-11-04 NOTE — Progress Notes (Signed)
    [facility] for Infectious Disease    Date of Admission:  10/28/2019   Total days of antibiotics 7           ID: Kelsey Rodriguez is a 28 y.o. female with CNS toxo Active Problems:   Cerebral edema (HCC)    Subjective: Afebrile. Slight headache. Sleeping most of this morning  Medications:  . dronabinol  5 mg Oral BID AC  . enoxaparin (LOVENOX) injection  40 mg Subcutaneous Q24H  . fluconazole  200 mg Oral Daily  . polyethylene glycol  17 g Oral Daily  . Pyrimethamine-Leucovorin  50 mg Oral Daily   And  . Pyrimethamine-Leucovorin  25 mg Oral Daily  . sodium chloride flush  10-40 mL Intracatheter Q12H  . sulfaDIAZINE  1,500 mg Oral Q6H    Objective: Vital signs in last 24 hours: Temp:  [98.4 F (36.9 C)-98.8 F (37.1 C)] 98.8 F (37.1 C) (11/20 1621) Pulse Rate:  [73-82] 79 (11/20 1621) Resp:  [16-20] 16 (11/20 1621) BP: (93-112)/(63-73) 96/63 (11/20 1621) SpO2:  [100 %] 100 % (11/20 1621) Physical Exam  Constitutional:  oriented to person, place, and time. appears well-developed and well-nourished. No distress.  HENT: [place]/AT, PERRLA, no scleral icterus Mouth/Throat: Oropharynx is clear and moist. No oropharyngeal exudate.  Cardiovascular: Normal rate, regular rhythm and normal heart sounds. Exam reveals no gallop and no friction rub.  No murmur heard.  Pulmonary/Chest: Effort normal and breath sounds normal. No respiratory distress.  has no wheezes.  Neck = supple, no nuchal rigidity Abdominal: Soft. Bowel sounds are normal.  exhibits no distension. There is no tenderness.  Lymphadenopathy: no cervical adenopathy. No axillary adenopathy Neurological: alert and oriented to person, place, and time.  Skin: Skin is warm and dry. No rash noted. No erythema.  Psychiatric: a normal mood and affect.  behavior is normal.   Lab Results Recent Labs    11/03/19 0507 11/04/19 0610  NA 135 134*  K 4.2 4.5  CL 104 102  CO2 20* 21*  BUN 5* 6  CREATININE 1.02* 1.02*   Liver Panel No results for input(s): PROT, ALBUMIN, AST, ALT, ALKPHOS, BILITOT, BILIDIR, IBILI in the last 72 hours. Sedimentation Rate No results for input(s): ESRSEDRATE in the last 72 hours. C-Reactive Protein No results for input(s): CRP in the last 72 hours.  Microbiology: reviewed Studies/Results: No results found.   Assessment/Plan: CNS toxo = continue with pyrimethamine plus sulfadiazine  Thrush = continue with fluconazole  oi proph = will continue with bactrim ds daily  HIV/aids = poorly controlled, cd 4 count of 65/VL 137K.   Will see back on Monday. If questions arise, can contact dr nick powers this [facility] Infectious Diseases Cell: [phone] Pager: [phone]  11/04/2019, 5:57 PM

## 2019-11-05 LAB — BASIC METABOLIC PANEL
Anion gap: 12 (ref 5–15)
BUN: 7 mg/dL (ref 6–20)
CO2: 20 mmol/L — ABNORMAL LOW (ref 22–32)
Calcium: 9.7 mg/dL (ref 8.9–10.3)
Chloride: 100 mmol/L (ref 98–111)
Creatinine, Ser: 1.01 mg/dL — ABNORMAL HIGH (ref 0.44–1.00)
GFR calc Af Amer: >60 mL/min (ref >60)
GFR calc non Af Amer: >60 mL/min (ref >60)
Glucose, Bld: 89 mg/dL (ref 70–99)
Potassium: 4.5 mmol/L (ref 3.5–5.1)
Sodium: 132 mmol/L — ABNORMAL LOW (ref 135–145)

## 2019-11-05 NOTE — Discharge Summary (Addendum)
[facility] Discharge Summary  Patient name: Kelsey Rodriguez Medical record number: [id number] Date of birth: [birth year] Age: 28 y.o. Gender: female Date of Admission: 10/28/2019  Date of Discharge: 11/09/19   Admitting Physician: Kinnie Feil, MD  Primary Care Provider: Gerlene Fee, DO Consultants: ID   Indication for Hospitalization: headache with rim enhancing lesion with mass effect on imaging   Discharge Diagnoses/Problem List:  CNS Toxoplasmosis Oral candidiasis AIDS  Disposition: Home  Discharge Condition: Stable and improved  Discharge Exam:   GEN: pleasant female in no acute distress CV: regular rate and rhythm, no murmurs appreciated  RESP: no increased work of breathing, clear to ascultation bilaterally  ABD: Bowel sounds present. Soft, Nontender, Nondistended.  MSK: no lower extremity edema, or cyanosis noted SKIN: warm, dry, dry flaky skin on bilateral feet  NEURO: CN 2-12 grossly intact, moves all extremities appropriately PSYCH: Normal affect and thought content   Brief Hospital Course:   Toxoplasmosis  CNS Mass Effect Lesion  HIV  Kelsey Rodriguez is a 28 y.o. female presenting with ongoing headache. In the ED, MRI brain found to have 2.4 x 1.9 x 2.4 cm rim-enhancing lesion involving the right temporal lobe causing mass-effect with midline shift.  Patient had not been compliant with her retroantiviral therapy (Symtuza).  Her CD4 count was 65 with a HIV 1 of 137,000 and IgG toxoplasma gondii was >400.  As CNS lesion had a mass-effect, neurology and neurosurgery did not recommend LP or surgery to confirm toxoplasmosis and recommended treating for toxo emperically.  Patient was treated for toxoplasmosis with sulfadiazine, pyrimethamine with leucovorin.  Patient received PJP prophylaxis with Bactrim throughout her admission. HAART treatment was held to prevent immune reconstitution inflammatory syndrome (IRIS). Marinol was given  during  to increase patient's appetite however this medication was not covered by patient's insurance. Patient has scheduled follow-up with infectious disease on 11/18/2019.  She received her new outpatient medications prior to discharge.  Oral Candidiasis/ Candidal esophagitis Patient was treated for 8 of 14 days of fluconazole.  She should continue taking fluconazole for 6 additional days to complete a 14 day course. She did have mild improvement in her throat pain and poor po intake prior to discharge.  Reactive RPR + RPR. Negative trep ab indicating no active infection.  Contraception Patient planning on becoming pregnant prior to admission. Discussed the high morbidity risk to any pregnancy with such uncontrolled AIDS. Patient to follow up as outpatient for depot shot for contraception until can get HIV/AIDS under better control.  Issues for Follow Up:  1. Please remind patient to attend scheduled ID follow-up.   2. Review patient medications changes :   Continue bactrim for PJP prophylaxis.   Continue fluconazole until 11/15/2019.   Conitnue pyrimethamine+sulfadiazine for 6 weeks and then will need to retesting to determine secondary prophylaxis at ID follow-up.   Holding Symtuza (HAART) therapy until completion of toxoplasmosis treatment.  3.  Reassess patient's appetite and nausea at her hospital follow up.    Significant Procedures: None   Significant Labs and Imaging:  Recent Labs  Lab 11/08/19 1030 11/09/19 0332  WBC 3.3* 2.4*  HGB 13.3 12.6  HCT 39.2 36.5  PLT 191 192   Recent Labs  Lab 11/05/19 0536 11/06/19 0548 11/07/19 0303 11/08/19 1030 11/09/19 0332  NA 132* 133* 133* 134* 135  K 4.5 4.4 4.8 4.1 4.2  CL 100 100 102 101 103  CO2 20* 21* 23 23 21*  GLUCOSE  89 93 94 100* 89  BUN 7 8 9 7 9   CREATININE 1.01* 1.11* 1.15* 1.15* 1.10*  CALCIUM 9.7 9.7 9.6 10.0 9.9      Dg Chest 2 View  Result Date: 10/29/2019 CLINICAL DATA:  HIV EXAM: CHEST - 2 VIEW  COMPARISON:  05/25/2018 FINDINGS: Heart is borderline in size. Low lung volumes. No confluent opacities or effusions. No acute bony abnormality. IMPRESSION: No active cardiopulmonary disease. Electronically Signed   By: Rolm Baptise M.D.   On: 10/29/2019 00:46   Ct Head Wo Contrast  Result Date: 10/28/2019 CLINICAL DATA:  Headache. EXAM: CT HEAD WITHOUT CONTRAST TECHNIQUE: Contiguous axial images were obtained from the base of the skull through the vertex without intravenous contrast. COMPARISON:  CT dated 03/28/2014.  MRI head dated February 18, 2018 FINDINGS: Brain: There is a 6.6 x 4.2 cm area of vasogenic edema primarily involving the right temporal lobe. There is mild mass effect on the right lateral ventricle with a leftward midline shift measuring approximately 3-4 mm. There is no acute intracranial hemorrhage. Vascular: No hyperdense vessel or unexpected calcification. Skull: Normal. Negative for fracture or focal lesion. Sinuses/Orbits: No acute finding. Other: None. IMPRESSION: Large area of vasogenic edema primarily involving the right temporal lobe. There is mass effect and a leftward midline shift measuring approximately 3-4 mm. Follow-up with a contrast-enhanced MRI is recommended for evaluation of an underlying mass or infectious process. Electronically Signed   By: Constance Holster M.D.   On: 10/28/2019 18:22   Mr Brain W And Wo Contrast  Result Date: 10/28/2019 CLINICAL DATA:  Initial evaluation for acute severe headache. History of HIV. EXAM: MRI HEAD WITHOUT AND WITH CONTRAST TECHNIQUE: Multiplanar, multiecho pulse sequences of the brain and surrounding structures were obtained without and with intravenous contrast. CONTRAST:  33mL GADAVIST GADOBUTROL 1 MMOL/ML IV SOLN COMPARISON:  Comparison made with prior CT from earlier the same day as well as previous brain MRIs, most recent of which from 02/18/2018. FINDINGS: Brain: Mildly advanced cerebral atrophy for age, similar to 2019. Patchy  T2/FLAIR signal abnormality involving the right periatrial white matter is relatively stable from previous. Few additional scattered nonspecific subcentimeter foci of T2/FLAIR signal intensity also relatively unchanged. No abnormal foci of restricted diffusion to suggest acute or subacute ischemia. No encephalomalacia to suggest chronic infarction. No acute intracranial hemorrhage. No extra-axial fluid collection. Irregular cortically based rim enhancing lesion positioned at the anterior-mid right temporal lobe measures 2.4 x 1.9 x 2.4 cm (series 18, image 28), new from previous MRI, corresponding with abnormality seen on prior CT from earlier today. Lesion demonstrates heterogeneous hypointense internal T2 signal intensity with a thick peripheral rind of irregular enhancement. Faint susceptibility artifact suggestive of blood products and/or necrosis. No central restricted diffusion to suggest frank abscess. Associated extensive vasogenic edema throughout the adjacent right temporal lobe with 4 mm of right-to-left midline shift. No hydrocephalus or ventricular trapping. Associated scattered leptomeningeal and dural enhancement within the adjacent right frontotemporal region. No intraventricular debris or ependymal enhancement. No other mass lesion or abnormal enhancement elsewhere within the brain. Vascular: Major intracranial vascular flow voids are well maintained. Skull and upper cervical spine: Craniocervical junction within normal limits. Visualized upper cervical spine normal. Bone marrow signal intensity within normal limits. Scalp soft tissues within normal limits. Sinuses/Orbits: Globes and orbital soft tissues demonstrate no acute finding. Mild scattered mucosal thickening throughout the ethmoidal air cells. Paranasal sinuses are otherwise clear. Trace left mastoid effusion. Inner ear structures normal. Other: None. IMPRESSION:  1. 2.4 x 1.9 x 2.4 cm cortically based rim enhancing lesion involving the  right temporal lobe with associated scattered leptomeningeal and dural enhancement within the adjacent right frontotemporal region. Associated vasogenic edema with up to 4 mm of right-to-left midline shift. Given the history of HIV, primary differential consideration consists of intracranial infection with meningitis and localized cerebritis, including opportunistic infections such as TB or toxoplasmosis. No frank restricted diffusion to suggest intracerebral abscess. Correlation with CSF fluid analysis recommended. Sequelae of IRIS could also be considered in the correct clinical setting. High-grade glioma could also have this appearance, although is felt to be less likely and should only be considered if alternate etiologies are excluded. 2. Underlying mildly advanced generalized cerebral volume loss for age, which could reflect the sequelae of HIV encephalitis. Electronically Signed   By: Jeannine Boga M.D.   On: 10/28/2019 21:39   Mr Lumbar Spine W/o Contrast  Result Date: 10/21/2019 CLINICAL DATA:  Mid to low back pain for the past 4 weeks. No known injury. EXAM: MRI LUMBAR SPINE WITHOUT CONTRAST TECHNIQUE: Multiplanar, multisequence MR imaging of the lumbar spine was performed. No intravenous contrast was administered. COMPARISON:  MRI lumbar spine dated September 12, 2018. FINDINGS: Segmentation:  Standard. Alignment:  Physiologic. Vertebrae: No fracture, evidence of discitis, or bone lesion. Unchanged T11 hemangioma. Conus medullaris and cauda equina: Conus extends to the L2 level. Conus and cauda equina appear normal. Paraspinal and other soft tissues: Negative. Disc levels: T11-T12 to L4-L5: Negative. L5-S1: Unchanged tiny shallow broad-based posterior disc protrusion. No stenosis. IMPRESSION: 1. Unchanged minimal degenerative disc disease at L5-S1. No stenosis or impingement. Electronically Signed   By: Titus Dubin M.D.   On: 10/21/2019 17:36     Results/Tests Pending at Time of  Discharge: None   Discharge Medications:  Allergies as of 11/09/2019      Reactions   Hydrocodone Itching, Nausea Only   Tolerates Oxycodone   Tramadol Itching, Nausea Only   Tolerates oxycodone      Medication List    STOP taking these medications   acetaminophen-codeine 300-30 MG tablet Commonly known as: TYLENOL #3   cyclobenzaprine 5 MG tablet Commonly known as: FLEXERIL   diazepam 5 MG tablet Commonly known as: VALIUM   gabapentin 100 MG capsule Commonly known as: NEURONTIN   medroxyPROGESTERone 10 MG tablet Commonly known as: PROVERA   methylPREDNISolone 4 MG tablet Commonly known as: Medrol   omeprazole 20 MG capsule Commonly known as: PRILOSEC   oxyCODONE-acetaminophen 5-325 MG tablet Commonly known as: PERCOCET/ROXICET   predniSONE 50 MG tablet Commonly known as: DELTASONE   senna-docusate 8.6-50 MG tablet Commonly known as: Senokot-S   sulfamethoxazole-trimethoprim 400-80 MG tablet Commonly known as: BACTRIM Replaced by: sulfamethoxazole-trimethoprim 800-160 MG tablet   Symtuza 800-150-200-10 MG Tabs Generic drug: Darunavir-Cobicisctat-Emtricitabine-Tenofovir Alafenamide   terbinafine 1 % cream Commonly known as: LAMISIL   tiZANidine 4 MG tablet Commonly known as: Zanaflex     TAKE these medications   acetaminophen 500 MG tablet Commonly known as: TYLENOL Take 1,000 mg by mouth every 6 (six) hours as needed for moderate pain.   fluconazole 200 MG tablet Commonly known as: DIFLUCAN Take 1 tablet (200 mg total) by mouth daily.   ondansetron 8 MG tablet Commonly known as: ZOFRAN Take 1 tablet (8 mg total) by mouth every 6 (six) hours as needed for nausea.   polyethylene glycol powder 17 GM/SCOOP powder Commonly known as: GLYCOLAX/MIRALAX Take 17 g by mouth 2 (two) times daily as needed  for mild constipation.   Pyrimethamine-Leucovorin 50-25 MG Caps Take 50 mg by mouth daily for 10 days.   Pyrimethamine-Leucovorin 25-5 MG Caps Take  25 mg by mouth daily.   sulfaDIAZINE 500 MG tablet Take 3 tablets (1,500 mg total) by mouth 4 (four) times daily.   sulfamethoxazole-trimethoprim 800-160 MG tablet Commonly known as: BACTRIM DS Take 1 tablet by mouth daily. Replaces: sulfamethoxazole-trimethoprim 400-80 MG tablet   SUMAtriptan 50 MG tablet Commonly known as: Imitrex Take 1 tablet (50 mg total) by mouth every 2 (two) hours as needed for migraine (Maximum dose: 100 mg per dose; 200 mg per 24 hours). May repeat in 2 hours if headache persists or recurs. What changed: Another medication with the same name was removed. Continue taking this medication, and follow the directions you see here.       Discharge Instructions: Please refer to Patient Instructions section of EMR for full details.  Patient was counseled important signs and symptoms that should prompt return to medical care, changes in medications, dietary instructions, activity restrictions, and follow up appointments.   Follow-Up Appointments: Follow-up Information    Autry-Lott, Naaman Plummer, DO. Go on 11/22/2019.   Specialty: Family Medicine Why: Scheduled appointment at 10:10 at the [facility].  Contact information: [address] [place] [address] [phone]        Carlyle Basques, MD Follow up on 11/18/2019.   Specialty: Infectious Diseases Why: Scheduled appointment at 9:45AM with Mr. Elna Breslow (NP) Contact information: [address] [place] [address] [phone]           Lyndee Hensen, MD 11/09/2019, 11:37 AM PGY-1, [place] [facility] ---------------------------------------------------------------------------------- Upper Level Addendum: I have evaluated this patient along with Dr. Susa Simmonds and reviewed the above note, making revisions where needed  Guadalupe [name] MD PGY-3 Family Medicine Resident

## 2019-11-05 NOTE — Progress Notes (Signed)
FPTS Interim Progress Note  Discussed family planning with patient in context of uncontrolled HIV/AIDS diagnosis. Patient is currently on depo-provera and stated she has an appointment to get her next shot in the coming week. If patient is still in the hospital, we will deliver depot shot here. Patient stated that she would like to have a baby in the next 1-2 years. I discussed with her the likely timeline of treating the mass in her brain (presumed toxoplasmosis) and that (due to c/f IRIS), HAART treatment would likely not begin for another couple of months. We also discussed the importance of HAART for controlling HIV to be able to have a healthy pregnancy and reduce vertical transmission. Patient's desire to have a healthy family is likely good motivation for her to adhere to HAART. Pt's husband is aware of her diagnosis and has seen Dr. Baxter Flattery in the outpatient clinic. Patient is not aware of the timing of his last test, but states that he was negative and she told me they use condoms. Patient's husband is not on PrEP according to patient, but I recommended she discuss this with ID as being in a sero-discordant relationship makes him an excellent candidate for PrEP.  Gladys Damme, MD 11/05/2019, 2:22 PM PGY-1, [place] [facility] Service pager [phone]

## 2019-11-05 NOTE — Progress Notes (Addendum)
Family Medicine Teaching Service Daily Progress Note Intern Pager: [phone]  Patient name: Kelsey Rodriguez Medical record number: [id number] Date of birth: [birth year] Age: 28 y.o. Gender: female  Primary Care Provider: Gerlene Fee, DO Consultants: Infectious Disease Code Status: Full  Pt Overview and Major Events to Date:  10/28/2019: Admitted, rim enhancing lesion in R temporal lobe 11/01/2019: CD4 65, HIV 1 RNA 137K, T. Pallidum abs neg 11/03/2019: Bactrim discontinued, Sulfadiazine, pyrimethamine-leucovorin, and marinol started 11/04/2019: Bactrim restarted for OI ppx  Assessment and Plan: Kelsey Rodriguez a 28 y.o.femalepresenting with headache and back pain x 2weeks. PMH is significant for HIV, meningitis, syphilis, Bell's palsy, primary adrenal insufficiency,tuberculosis,herpessimplexesophagitis.  Headache/CNS lesion with mass-effect  CNS Toxoplasmosis Infection Minimal headache today. Has not required any analgesics. Tolerating antibiotics well. VSS overnight.  - Infectious disease following, appreciate recs, plan to see again Monday 11/23, will follow up dispo plan per ID  - continue Sulfadizine plus pyrimethamin-Leucovorin (11/19-) - Continue Bactrim DS QD for opportunistic infection ppx - Tylenol PRN for pain  - neuro checks q4 hours - vitals per floor  Oral Candidiasis:  Patient with CD4 65, known HIV/AIDS. Oropharynx clear on exam. -Continue diflucan for 7 total days, end on 11/23.  Nausea:  Improving Denies any nausea this AM. - Marinol 5mg  PO BID 1hr prior to meals - Zofran 8mg  q6 PRN  HIV/AID Poorly controlled. CD4 count 65, HIV1 RNA quant 137K. History of noncompliance to [place]. Still awaiting numerous laboratory findings to result. -Holding Symtuza per ID  History of tuberculosis CXR w/o active cardiopulmonary disease on admission.   -Continue to monitor for symptoms - follow up mycoplasma  Contraception  Family Planning: Per patient  she was due for depo-provera shot in coming weeks. She is followed by [facility].  However per chart review, appears she had declined Depo-provera at last visit on 11/11 and started on Provera 10mg  QD. She notes she was unable to pick this up prior to admission. She notes that Depo made her bleeding worse. Patient is also interested in becoming pregnant. Long discussion provided by Dr. Chauncey Reading on risks of vertical transmission with untreated AIDs and importance of compliance with HARRT treatment prior to trying to conceive. Patient understood this. Husband will also benefit from PrEP therapy as well. 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 Per chart review, patient likely would require bilateral salpingectomy to enhance fertility however she wuold not be an appropriate candidate for surgery or IVF until she has improved HIV suppression and CD4 count. - Unclear if Provera was rx'd for contraception vs menorrhagia. Will discuss further with patient in regards to contraception options as it is not advisable for her to get pregnant at this time  - follow up with OBGYN outpatient to further discuss fertility options  FEN/GI: Regular diet PPx: Lovenox  Disposition: Continue care on progressive unit pending disposition planning per ID  Subjective:  Patient doing well this AM. Feeling much better. Very eager to go home. Denies any headache or nausea.  Objective: Temp:  [98.1 F (36.7 C)-98.5 F (36.9 C)] 98.5 F (36.9 C) (11/22 0458) Pulse Rate:  [78-95] 81 (11/22 0458) Resp:  [16-20] 16 (11/22 0458) BP: (95-115)/(65-79) 95/65 (11/22 0458) SpO2:  [97 %-100 %] 100 % (11/22 0458) Physical Exam: General: very pleasant young female, sitting up comfortably in exam bed, well nourished, well developed, in no acute distress with non-toxic appearance CV: regular rate and rhythm without murmurs, rubs, or  gallops, no lower extremity edema Lungs: clear  to auscultation bilaterally with normal work of breathing Skin: warm, dry Extremities: warm and well perfused Neuro: Alert and oriented, speech normal, no focal neuro deficits  Laboratory: Recent Labs  Lab 10/31/19 0508 11/01/19 0505  WBC 2.5* 2.6*  HGB 12.2 12.0  HCT 37.0 35.0*  PLT 177 178   Recent Labs  Lab 11/03/19 0507 11/04/19 0610 11/05/19 0536  NA 135 134* 132*  K 4.2 4.5 4.5  CL 104 102 100  CO2 20* 21* 20*  BUN 5* 6 7  CREATININE 1.02* 1.02* 1.01*  CALCIUM 9.2 9.4 9.7  GLUCOSE 88 84 89   Cortisol (11/14): 11.7 within normal limits ACTH: WNL RPR: Reactive, Titre pending, T. Pallidum abs negative  Imaging/Diagnostic Tests: Dg Chest 2 View Result Date: 10/29/2019 CLINICAL DATA:  HIV EXAM: CHEST - 2 VIEW COMPARISON:  05/25/2018 FINDINGS: Heart is borderline in size. Low lung volumes. No confluent opacities or effusions. No acute bony abnormality. IMPRESSION: No active cardiopulmonary disease. Electronically Signed   By: Rolm Baptise M.D.   On: 10/29/2019 00:46   Ct Head Wo Contrast Result Date: 10/28/2019 CLINICAL DATA:  Headache. EXAM: CT HEAD WITHOUT CONTRAST TECHNIQUE: Contiguous axial images were obtained from the base of the skull through the vertex without intravenous contrast. COMPARISON:  CT dated 03/28/2014.  MRI head dated February 18, 2018 FINDINGS: Brain: There is a 6.6 x 4.2 cm area of vasogenic edema primarily involving the right temporal lobe. There is mild mass effect on the right lateral ventricle with a leftward midline shift measuring approximately 3-4 mm. There is no acute intracranial hemorrhage. Vascular: No hyperdense vessel or unexpected calcification. Skull: Normal. Negative for fracture or focal lesion. Sinuses/Orbits: No acute finding. Other: None. IMPRESSION: Large area of vasogenic edema primarily involving the right temporal lobe. There is mass effect and a leftward midline shift measuring approximately 3-4 mm. Follow-up with a contrast-enhanced  MRI is recommended for evaluation of an underlying mass or infectious process.  [name] Contrast IMPRESSION: 1. 2.4 x 1.9 x 2.4 cm cortically based rim enhancing lesion involving the right temporal lobe with associated scattered leptomeningeal and dural enhancement within the adjacent right frontotemporal region. Associated vasogenic edema with up to 4 mm of right-to-left midline shift. Given the history of HIV, primary differential consideration consists of intracranial infection with meningitis and localized cerebritis, including opportunistic infections such as TB or toxoplasmosis. No frank restricted diffusion to suggest intracerebral abscess. Correlation with CSF fluid analysis recommended. Sequelae of IRIS could also be considered in the correct clinical setting. High-grade glioma could also have this appearance, although is felt to be less likely and should only be considered if alternate etiologies are excluded. 2. Underlying mildly advanced generalized cerebral volume loss for age, which could reflect the sequelae of HIV encephalitis.    Kelsey Marble [place], DO 11/06/2019, 6:03 AM PGY-2, [place] Intern pager: [phone], text pages welcome

## 2019-11-05 NOTE — Progress Notes (Signed)
Family Medicine Teaching Service Daily Progress Note Intern Pager: [phone]  Patient name: Kelsey Rodriguez Medical record number: [id number] Date of birth: September 17, 1991 Age: 28 y.o. Gender: female  Primary Care Provider: Gerlene Fee, DO Consultants: Infectious Disease Code Status: Full  Pt Overview and Major Events to Date:  10/28/2019: Admitted, rim enhancing lesion in R temporal lobe 11/01/2019: CD4 65, HIV 1 RNA 137K, T. Pallidum abs neg 11/03/2019: Bactrim discontinued, Sulfadiazine, pyrimethamine-leucovorin, and marinol started 11/04/2019: Bactrim restarted for OI ppx  Assessment and Plan: Kelsey Rodriguez a 28 y.o.femalepresenting with headache and back pain x 2weeks. PMH is significant for HIV, meningitis, syphilis, Bell's palsy, primary adrenal insufficiency,tuberculosis,herpessimplexesophagitis.  Headache/CNS lesion with mass-effect Headache is minimal today, pt does not require analgesics. Pt started sulfadiazine and pyrimethamine on 11/19. VSS, BP normotensive.  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 Will discuss implications of HIV on family planning. Awaiting disposition planning per ID. Bactrim restarted for OI ppx. - Infectious disease following, appreciate their recommendations - Continue on sulfadiazine plus pyrimethamine plus leucovorin  - Continue bactrim - Continue Tylenol for pain control - Neurochecks every 4 hours - Vital signs per floor routine - Lovenox for DVT prophylaxis  Oral Candidiasis:  Patient with CD4 65, known HIV/AIDS. Oropharynx clear on exam. -Continue diflucan for 7 total days, end on 11/23.  Nausea:  Wt has minimally increased. No nausea today! -Marinol 5mg  PO BID 1hr prior to meals -Zofran 8mg  q6 PRN -EKG 11/15 without QTc changes  HIV/AIDS CD4 count of 65, HIV 1 RNA quant 137,000.  Patient with history of noncompliance of Symtuza. Still awaiting numerous laboratory findings to result. -Holding Symtuza per  ID  Primary adrenal insufficiency/history of avascular necrosis bilateral hips A.m. cortisol level 11.7, within normal limits.  ACTH WNL. Patient was previously on steroids, but this was stopped due to avascular necrosis.  Patient does have bilateral hip replacements.  History of tuberculosis CXR w/o active cardiopulmonary disease on admission.  Tuberculosis is on differential for possibility of rim-enhancing lesion in brain.  No active lung manifestations, would not be considered contagious at this point.  We will follow-up testing for mycoplasma. -Continue to monitor for symptoms  FEN/GI: Regular diet PPx: Lovenox  Disposition: Continue care on progressive unit pending disposition planning per ID  Subjective:  Pt is feeling better overall with minimal head ache and no nausea.   Objective: Temp:  [98.4 F (36.9 C)-98.8 F (37.1 C)] 98.4 F (36.9 C) (11/21 0415) Pulse Rate:  [73-81] 79 (11/21 0415) Resp:  [16-18] 18 (11/21 0415) BP: (95-112)/(58-73) 101/66 (11/21 0415) SpO2:  [100 %] 100 % (11/21 0415) Physical Exam: General: Pleasant patient, nontoxic appearing, no apparent distress HEENT: PERRLA, EOMI, oropharynx with some white exudate on bilateral cheek walls Cardiovascular: RRR, S1-S2 present, no murmurs appreciated Respiratory: CTA bilaterally, no adventitious sounds appreciated, normal work of breathing Abdomen: Soft, nontender, normal bowel sounds appreciated Extremities: No deformity, cyanosis, or edema Neuro: AO x4, no focal neurological deficits appreciated, patient able to move all limbs spontaneously; can follow instructions; sensation intact to bilateral upper and lower extremities.  Laboratory: Recent Labs  Lab 10/30/19 0506 10/31/19 0508 11/01/19 0505  WBC 2.5* 2.5* 2.6*  HGB 12.2 12.2 12.0  HCT 36.3 37.0 35.0*  PLT 167 177 178   Recent Labs  Lab 11/03/19 0507 11/04/19 0610 11/05/19 0536  NA 135 134* 132*  K 4.2 4.5 4.5  CL 104 102 100  CO2 20*  21* 20*  BUN 5* 6  7  CREATININE 1.02* 1.02* 1.01*  CALCIUM 9.2 9.4 9.7  GLUCOSE 88 84 89   Cortisol (11/14): 11.7 within normal limits ACTH: WNL RPR: Reactive, Titre pending, T. Pallidum abs negative  Imaging/Diagnostic Tests: Dg Chest 2 View Result Date: 10/29/2019 CLINICAL DATA:  HIV EXAM: CHEST - 2 VIEW COMPARISON:  05/25/2018 FINDINGS: Heart is borderline in size. Low lung volumes. No confluent opacities or effusions. No acute bony abnormality. IMPRESSION: No active cardiopulmonary disease. Electronically Signed   By: Rolm Baptise M.D.   On: 10/29/2019 00:46   Ct Head Wo Contrast Result Date: 10/28/2019 CLINICAL DATA:  Headache. EXAM: CT HEAD WITHOUT CONTRAST TECHNIQUE: Contiguous axial images were obtained from the base of the skull through the vertex without intravenous contrast. COMPARISON:  CT dated 03/28/2014.  MRI head dated February 18, 2018 FINDINGS: Brain: There is a 6.6 x 4.2 cm area of vasogenic edema primarily involving the right temporal lobe. There is mild mass effect on the right lateral ventricle with a leftward midline shift measuring approximately 3-4 mm. There is no acute intracranial hemorrhage. Vascular: No hyperdense vessel or unexpected calcification. Skull: Normal. Negative for fracture or focal lesion. Sinuses/Orbits: No acute finding. Other: None. IMPRESSION: Large area of vasogenic edema primarily involving the right temporal lobe. There is mass effect and a leftward midline shift measuring approximately 3-4 mm. Follow-up with a contrast-enhanced MRI is recommended for evaluation of an underlying mass or infectious process.  Mr Kelsey Rodriguez And Wo Contrast IMPRESSION: 1. 2.4 x 1.9 x 2.4 cm cortically based rim enhancing lesion involving the right temporal lobe with associated scattered leptomeningeal and dural enhancement within the adjacent right frontotemporal region. Associated vasogenic edema with up to 4 mm of right-to-left midline shift. Given the history of HIV,  primary differential consideration consists of intracranial infection with meningitis and localized cerebritis, including opportunistic infections such as TB or toxoplasmosis. No frank restricted diffusion to suggest intracerebral abscess. Correlation with CSF fluid analysis recommended. Sequelae of IRIS could also be considered in the correct clinical setting. High-grade glioma could also have this appearance, although is felt to be less likely and should only be considered if alternate etiologies are excluded. 2. Underlying mildly advanced generalized cerebral volume loss for age, which could reflect the sequelae of HIV encephalitis.    Gladys Damme, MD 11/05/2019, 6:34 AM PGY-1, [place] Intern pager: [phone], text pages welcome

## 2019-11-06 LAB — BASIC METABOLIC PANEL
Anion gap: 12 (ref 5–15)
BUN: 8 mg/dL (ref 6–20)
CO2: 21 mmol/L — ABNORMAL LOW (ref 22–32)
Calcium: 9.7 mg/dL (ref 8.9–10.3)
Chloride: 100 mmol/L (ref 98–111)
Creatinine, Ser: 1.11 mg/dL — ABNORMAL HIGH (ref 0.44–1.00)
GFR calc Af Amer: >60 mL/min (ref >60)
GFR calc non Af Amer: >60 mL/min (ref >60)
Glucose, Bld: 93 mg/dL (ref 70–99)
Potassium: 4.4 mmol/L (ref 3.5–5.1)
Sodium: 133 mmol/L — ABNORMAL LOW (ref 135–145)

## 2019-11-06 MED ORDER — MEDROXYPROGESTERONE ACETATE 10 MG PO TABS
10.0000 mg | ORAL_TABLET | Freq: Every day | ORAL | Status: DC
  Filled 2019-11-06: qty 1

## 2019-11-07 DIAGNOSIS — B582 Toxoplasma meningoencephalitis: Secondary | ICD-10-CM

## 2019-11-07 DIAGNOSIS — B589 Toxoplasmosis, unspecified: Secondary | ICD-10-CM

## 2019-11-07 DIAGNOSIS — B20 Human immunodeficiency virus [HIV] disease: Secondary | ICD-10-CM

## 2019-11-07 LAB — BASIC METABOLIC PANEL
Anion gap: 8 (ref 5–15)
BUN: 9 mg/dL (ref 6–20)
CO2: 23 mmol/L (ref 22–32)
Calcium: 9.6 mg/dL (ref 8.9–10.3)
Chloride: 102 mmol/L (ref 98–111)
Creatinine, Ser: 1.15 mg/dL — ABNORMAL HIGH (ref 0.44–1.00)
GFR calc Af Amer: >60 mL/min (ref >60)
GFR calc non Af Amer: >60 mL/min (ref >60)
Glucose, Bld: 94 mg/dL (ref 70–99)
Potassium: 4.8 mmol/L (ref 3.5–5.1)
Sodium: 133 mmol/L — ABNORMAL LOW (ref 135–145)

## 2019-11-07 MED ORDER — FLUCONAZOLE 200 MG PO TABS
200.0000 mg | ORAL_TABLET | Freq: Every day | ORAL | Status: DC
  Administered 2019-11-08 – 2019-11-09 (×2): 200 mg via ORAL
  Filled 2019-11-07 (×2): qty 1

## 2019-11-07 MED ORDER — SULFADIAZINE 500 MG PO TABS: 1500.0000 mg | ORAL_TABLET | Freq: Four times a day (QID) | ORAL | 0 refills | Status: DC

## 2019-11-07 NOTE — Progress Notes (Addendum)
Family Medicine Teaching Service Daily Progress Note Intern Pager: [phone]  Patient name: Kelsey Rodriguez Medical record number: [id number] Date of birth: 07-16-1991 Age: 28 y.o. Gender: female  Primary Care Provider: Gerlene Fee, DO Consultants: Infectious Disease Code Status: Full  Pt Overview and Major Events to Date:  10/28/2019: Admitted, rim enhancing lesion in R temporal lobe 11/01/2019: CD4 65, HIV 1 RNA 137K, T. Pallidum abs neg 11/03/2019: Bactrim discontinued, Sulfadiazine, pyrimethamine-leucovorin, and marinol started 11/04/2019: Bactrim restarted for opportunistic infection ppx  Assessment and Plan: Kelsey Rodriguez is a 28 y.o. female presenting with headache and back pain x 2weeks. PMH is significant for HIV, meningitis, syphilis, Bell's palsy, primary adrenal insufficiency, TB, herpes simplex esophagitis    Headache  CNS lesion with mass-effect  CNS Toxoplasmosis Infection Patient denies headache and nausea.  Neurological exam at baseline. Awaiting infectious disease updated recommendations.  Creatine slowing inclining, likely due to Bactrim.  Cr baseline 0.8 and today Cr 1.15.  Patient is stable for discharge.  - Infectious disease following, appreciate recommendations   - Continue Sulfadizine plus pyrimethamin-Leucovorin (11/19-) - Continue Bactrim DS QD for opportunistic infection ppx - Tylenol PRN for pain  - Frequent neuro checks  - Vitals per routine   Oral Candidiasis: Patient with CD4 65, known HIV/AIDS. Oropharynx clear on exam. -Continue diflucan for 7 total days, end today 11/23.  Nausea Denies any nausea this morning.  Has been able to eat and drink. - Marinol 5mg  PO BID 1hr prior to meals - Zofran 8mg  q6 PRN  HIV/AIDS Poorly controlled. CD4 count 65, HIV1 RNA quant 137K. History of noncompliance to [facility].  - Infectious disease following  - Holding Symtuza per ID  History of tuberculosis CXR w/o active cardiopulmonary disease on  admission.   -Continue to monitor for symptoms - follow up mycoplasma  Contraception  Family Planning Patient plans to continue depo provera for contraception.    FEN/GI: Regular diet PPx: Lovenox  Disposition: Continue care on progressive unit pending disposition planning per ID  Subjective:  Kelsey Rodriguez no significant overnight events.   Objective: Temp:  [98 F (36.7 C)-98.6 F (37 C)] 98.4 F (36.9 C) (11/23 0342) Pulse Rate:  [78-89] 78 (11/23 0342) Resp:  [16-20] 18 (11/23 0342) BP: (103-120)/(70-78) 107/73 (11/23 0342) SpO2:  [99 %-100 %] 100 % (11/23 0342) Physical Exam:  GEN: pleasant, in no acute distress  CV: regular rate and rhythm, no murmurs appreciated  RESP: no increased work of breathing, clear to ascultation bilaterally  ABD: Bowel sounds present. Soft, Nontender, Nondistended.  MSK: no lower extremity edema, normal ROM SKIN: warm, dry NEURO: grossly normal, moves all extremities appropriately    Laboratory: Recent Labs  Lab 11/01/19 0505  WBC 2.6*  HGB 12.0  HCT 35.0*  PLT 178   Recent Labs  Lab 11/05/19 0536 11/06/19 0548 11/07/19 0303  NA 132* 133* 133*  K 4.5 4.4 4.8  CL 100 100 102  CO2 20* 21* 23  BUN 7 8 9   CREATININE 1.01* 1.11* 1.15*  CALCIUM 9.7 9.7 9.6  GLUCOSE 89 93 94   Cortisol (11/14): 11.7 within normal limits ACTH: WNL RPR: Reactive, Titre pending, T. Pallidum abs negative  Imaging/Diagnostic Tests: Dg Chest 2 View Result Date: 10/29/2019 CLINICAL DATA:  HIV EXAM: CHEST - 2 VIEW COMPARISON:  05/25/2018 FINDINGS: Heart is borderline in size. Low lung volumes. No confluent opacities or effusions. No acute bony abnormality. IMPRESSION: No active cardiopulmonary disease. Electronically Signed   By: Lennette Bihari  Dover M.D.   On: 10/29/2019 00:46   Ct Head Wo Contrast Result Date: 10/28/2019 CLINICAL DATA:  Headache. EXAM: CT HEAD WITHOUT CONTRAST TECHNIQUE: . IMPRESSION: Large area of vasogenic edema primarily  involving the right temporal lobe. There is mass effect and a leftward midline shift measuring approximately 3-4 mm. Follow-up with a contrast-enhanced MRI is recommended for evaluation of an underlying mass or infectious process.  [name] Contrast IMPRESSION: 1. 2.4 x 1.9 x 2.4 cm cortically based rim enhancing lesion involving the right temporal lobe with associated scattered leptomeningeal and dural enhancement within the adjacent right frontotemporal region. Associated vasogenic edema with up to 4 mm of right-to-left midline shift. Given the history of HIV, primary differential consideration consists of intracranial infection with meningitis and localized cerebritis, including opportunistic infections such as TB or toxoplasmosis. No frank restricted diffusion to suggest intracerebral abscess. Correlation with CSF fluid analysis recommended. Sequelae of IRIS could also be considered in the correct clinical setting. High-grade glioma could also have this appearance, although is felt to be less likely and should only be considered if alternate etiologies are excluded. 2. Underlying mildly advanced generalized cerebral volume loss for age, which could reflect the sequelae of HIV encephalitis.    Lyndee Hensen, MD 11/07/2019, 9:22 AM PGY-2, [place] Intern pager: [phone], text pages welcome

## 2019-11-07 NOTE — Progress Notes (Signed)
[facility] for Infectious Disease    Date of Admission:  10/28/2019      Total days of antibiotics 9             ID: Kelsey Rodriguez is a 28 y.o. 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 No complaints today. Needs to crush her pills to swallow them and feels that they "hang in her chest for hours after."  Anxious to go home when she is safe to do so.     Medications:  . dronabinol  5 mg Oral BID AC  . enoxaparin (LOVENOX) injection  40 mg Subcutaneous Q24H  . polyethylene glycol  17 g Oral Daily  . Pyrimethamine-Leucovorin  50 mg Oral Daily   And  . Pyrimethamine-Leucovorin  25 mg Oral Daily  . sodium chloride flush  10-40 mL Intracatheter Q12H  . sulfaDIAZINE  1,500 mg Oral Q6H  . sulfamethoxazole-trimethoprim  1 tablet Oral Daily    Objective: Vital signs in last 24 hours: Temp:  [98 F (36.7 C)-98.6 F (37 C)] 98.4 F (36.9 C) (11/23 0342) Pulse Rate:  [78-89] 78 (11/23 0342) Resp:  [16-20] 18 (11/23 0342) BP: (103-120)/(70-78) 107/73 (11/23 0342) SpO2:  [99 %-100 %] 100 % (11/23 0342)  Physical Exam  Constitutional:  oriented to person, place, and time. appears well-developed and well-nourished. No distress.  HENT: [place]/AT, PERRLA, no scleral icterus. Normal gross visual fields.  Mouth/Throat: Oropharynx is clear and moist. No oropharyngeal exudate.  Cardiovascular: Normal rate, regular rhythm and normal heart sounds. Exam reveals no gallop and no friction rub. No murmur heard.  Pulmonary/Chest: Effort normal and breath sounds normal. No respiratory distress.  has no wheezes.  Neck = supple, no nuchal rigidity and full active ROM.  Abdominal: Soft. Bowel sounds are normal.  exhibits no distension. There is no tenderness.  Lymphadenopathy: no cervical  adenopathy. No axillary adenopathy Neurological: alert and oriented to person, place, and time.  Skin: Skin is warm and dry. No rash noted. No erythema.  Psychiatric: a normal mood and affect.  behavior is normal.    Lab Results Recent Labs    11/06/19 0548 11/07/19 0303  NA 133* 133*  K 4.4 4.8  CL 100 102  CO2 21* 23  BUN 8 9  CREATININE 1.11* 1.15*   Liver Panel No results for input(s): PROT, ALBUMIN, AST, ALT, ALKPHOS, BILITOT, BILIDIR, IBILI in the last 72 hours. Sedimentation Rate No results for input(s): ESRSEDRATE in the last 72 hours. C-Reactive Protein No results for input(s): CRP in the last 72 hours.  Microbiology: reviewed Studies/Results: No results found.   Assessment/Plan: CNS toxo = continue with pyrimethamine plus sulfadiazine for minimum 6 week induction therapy then secondary prophylaxis until immune markers have improved.   Thrush = Improved. Given swallowing difficulties may need to treat x 7 more days. Would like to see her tolerate p.o. diet today with possible D/C tomorrow.     oi proph = will continue with bactrim ds daily for PJP prophylaxis.   HIV/aids = poorly controlled, cd 4 count of 65/VL 137K. Will have her follow up in ID clinic in 2 weeks to reconsider timing to re-introduce her antiretrovirals. She did report to [place] intermittent adherence before complete cessation of ARVS - will plan to check a genotype in  the ID clinic prior to re-introducing meds. Would plan to resume previous regimen of Symtuza for barrier to resistance as long as no DDIs that would prohibit this.  Appt scheduled for 11/18/2019 @ 9:45 a.m. with Terri Piedra, NP    Janene Madeira, MSN, NP-C [facility] for Infectious Disease [email or web address] Pager: [phone] Office: [phone] [place]: [phone]

## 2019-11-08 LAB — BASIC METABOLIC PANEL
Anion gap: 10 (ref 5–15)
BUN: 7 mg/dL (ref 6–20)
CO2: 23 mmol/L (ref 22–32)
Calcium: 10 mg/dL (ref 8.9–10.3)
Chloride: 101 mmol/L (ref 98–111)
Creatinine, Ser: 1.15 mg/dL — ABNORMAL HIGH (ref 0.44–1.00)
GFR calc Af Amer: >60 mL/min (ref >60)
GFR calc non Af Amer: >60 mL/min (ref >60)
Glucose, Bld: 100 mg/dL — ABNORMAL HIGH (ref 70–99)
Potassium: 4.1 mmol/L (ref 3.5–5.1)
Sodium: 134 mmol/L — ABNORMAL LOW (ref 135–145)

## 2019-11-08 LAB — CBC
HCT: 39.2 % (ref 36.0–46.0)
Hemoglobin: 13.3 g/dL (ref 12.0–15.0)
MCH: 31.7 pg (ref 26.0–34.0)
MCHC: 33.9 g/dL (ref 30.0–36.0)
MCV: 93.6 fL (ref 80.0–100.0)
Platelets: 191 10*3/uL (ref 150–400)
RBC: 4.19 MIL/uL (ref 3.87–5.11)
RDW: 12.6 % (ref 11.5–15.5)
WBC: 3.3 10*3/uL — ABNORMAL LOW (ref 4.0–10.5)
nRBC: 0 % (ref 0.0–0.2)

## 2019-11-08 MED ORDER — PYRIMETHAMINE-LEUCOVORIN 50-25 MG PO CAPS: 50.0000 mg | ORAL_CAPSULE | Freq: Every day | ORAL | 0 refills | Status: DC

## 2019-11-08 MED ORDER — PYRIMETHAMINE-LEUCOVORIN 25-5 MG PO CAPS: 25.0000 mg | ORAL_CAPSULE | Freq: Every day | ORAL | 0 refills | Status: DC

## 2019-11-08 MED ORDER — FLUCONAZOLE 200 MG PO TABS: 200.0000 mg | ORAL_TABLET | Freq: Every day | ORAL | 0 refills | Status: DC

## 2019-11-08 MED ORDER — PYRIMETHAMINE-LEUCOVORIN 50-25 MG PO CAPS: 50.0000 mg | ORAL_CAPSULE | Freq: Every day | ORAL | 0 refills | Status: AC

## 2019-11-08 MED ORDER — PYRIMETHAMINE-LEUCOVORIN 50-25 MG PO CAPS
50.0000 mg | ORAL_CAPSULE | Freq: Every day | ORAL | Status: DC
  Administered 2019-11-08: 50 mg via ORAL
  Filled 2019-11-08: qty 1

## 2019-11-08 MED ORDER — PYRIMETHAMINE-LEUCOVORIN 25-5 MG PO CAPS
25.0000 mg | ORAL_CAPSULE | Freq: Every day | ORAL | Status: DC
  Administered 2019-11-08: 25 mg via ORAL
  Filled 2019-11-08: qty 1

## 2019-11-08 MED ORDER — SULFAMETHOXAZOLE-TRIMETHOPRIM 800-160 MG PO TABS: 1.0000 | ORAL_TABLET | Freq: Every day | ORAL | 0 refills | Status: DC

## 2019-11-08 MED ORDER — DRONABINOL 5 MG PO CAPS: 5.0000 mg | ORAL_CAPSULE | Freq: Two times a day (BID) | ORAL | 0 refills | Status: DC

## 2019-11-08 NOTE — Progress Notes (Addendum)
Family Medicine Teaching Service Daily Progress Note Intern Pager: [phone]  Patient name: Kelsey Rodriguez Medical record number: [id number] Date of birth: December 18, 1990 Age: 28 y.o. Gender: female  Primary Care Provider: Gerlene Fee, DO Consultants: Infectious Disease Code Status: Full  Pt Overview and Major Events to Date:  10/28/2019: Admitted, rim enhancing lesion in R temporal lobe 11/01/2019: CD4 65, HIV 1 RNA 137K, T. Pallidum abs neg 11/03/2019: Bactrim discontinued, Sulfadiazine, pyrimethamine-leucovorin, and marinol started 11/04/2019: Bactrim restarted for opportunistic infection ppx  Assessment and Plan: Kelsey Rodriguez is a 28 y.o. female presenting with headache and back pain x 2weeks. PMH is significant for HIV, meningitis, syphilis, Bell's palsy, primary adrenal insufficiency, TB, herpes simplex esophagitis    Headache  CNS lesion with mass-effect  CNS Toxoplasmosis Infection Continues to deny headache, sore throat, painful swallowing, and nausea. Remains afebrile. Reports she feels like her food and pills don't go all the way down however she has been able to swallow crushed meds. Neuro exam at baseline. Creatine progressively up-trending likely due to Bactrim and decreased oral intake. Continue to encourage oral intake. Patient is stable for discharge. Patient has follow up outpatient on 11/18/19 and with PCP.  - Infectious disease following, appreciate recommendations   - Continue Sulfadizine plus pyrimethamin-Leucovorin (11/19-) - Continue Bactrim DS QD for opportunistic infection ppx - Tylenol PRN for pain  - Frequent neuro checks  - Vitals per routine   Oral Candidiasis: Patient with CD4 65, known HIV/AIDS. Oropharynx clear on exam. -Continue diflucan for 14 total days, end 11/30.  Nausea No nausea or vomiting today.  - Marinol 5mg  PO BID 1hr prior to meals - Zofran 8mg  q6 PRN  HIV/AIDS Poorly controlled. CD4 count 65, HIV1 RNA quant 137K. History of  noncompliance to [place].  - Infectious disease following  - Holding Symtuza per ID  History of tuberculosis CXR w/o active cardiopulmonary disease on admission.   -Continue to monitor for symptoms - follow up mycoplasma  Contraception  Family Planning Patient plans to continue depo provera for contraception.    FEN/GI: Regular diet PPx: Lovenox  Disposition: Continue care on progressive unit pending disposition planning per ID  Subjective:  Kelsey Rodriguez denies nausea, headache and painful swallowing.    Objective: Temp:  [98.2 F (36.8 C)-98.4 F (36.9 C)] 98.2 F (36.8 C) (11/24 0805) Pulse Rate:  [78-87] 86 (11/24 0805) Resp:  [16-18] 16 (11/24 0805) BP: (101-116)/(71-88) 116/88 (11/24 0805) SpO2:  [100 %] 100 % (11/24 0805) Weight:  [82.6 kg] 82.6 kg (11/24 0500) Physical Exam:  GEN: pleasant, in no acute distress  CV: regular rate and rhythm, no murmurs appreciated  RESP: no increased work of breathing, clear to ascultation bilaterally with no crackles, wheezes, or rhonchi  ABD: Bowel sounds present. Soft, Nontender, Nondistended. MSK: no lower extremity edema, or cyanosis noted SKIN: warm, dry NEURO: grossly normal, CN 2-12 grossly intact, moves all extremities appropriately PSYCH: Normal affect and thought content     Laboratory: No results for input(s): WBC, HGB, HCT, PLT in the last 168 hours. Recent Labs  Lab 11/05/19 0536 11/06/19 0548 11/07/19 0303  NA 132* 133* 133*  K 4.5 4.4 4.8  CL 100 100 102  CO2 20* 21* 23  BUN 7 8 9   CREATININE 1.01* 1.11* 1.15*  CALCIUM 9.7 9.7 9.6  GLUCOSE 89 93 94   Cortisol (11/14): 11.7 within normal limits ACTH: WNL RPR: Reactive, Titre pending, T. Pallidum abs negative  Imaging/Diagnostic Tests: Dg Chest 2 View  Result Date: 10/29/2019 CLINICAL DATA:  HIV EXAM: CHEST - 2 VIEW COMPARISON:  05/25/2018 FINDINGS: Heart is borderline in size. Low lung volumes. No confluent opacities or effusions. No acute  bony abnormality. IMPRESSION: No active cardiopulmonary disease. Electronically Signed   By: Rolm Baptise M.D.   On: 10/29/2019 00:46   Ct Head Wo Contrast Result Date: 10/28/2019 CLINICAL DATA:  Headache. EXAM: CT HEAD WITHOUT CONTRAST TECHNIQUE: . IMPRESSION: Large area of vasogenic edema primarily involving the right temporal lobe. There is mass effect and a leftward midline shift measuring approximately 3-4 mm. Follow-up with a contrast-enhanced MRI is recommended for evaluation of an underlying mass or infectious process.  Mr Kelsey Rodriguez And Wo Contrast IMPRESSION: 1. 2.4 x 1.9 x 2.4 cm cortically based rim enhancing lesion involving the right temporal lobe with associated scattered leptomeningeal and dural enhancement within the adjacent right frontotemporal region. Associated vasogenic edema with up to 4 mm of right-to-left midline shift. Given the history of HIV, primary differential consideration consists of intracranial infection with meningitis and localized cerebritis, including opportunistic infections such as TB or toxoplasmosis. No frank restricted diffusion to suggest intracerebral abscess. Correlation with CSF fluid analysis recommended. Sequelae of IRIS could also be considered in the correct clinical setting. High-grade glioma could also have this appearance, although is felt to be less likely and should only be considered if alternate etiologies are excluded. 2. Underlying mildly advanced generalized cerebral volume loss for age, which could reflect the sequelae of HIV encephalitis.    Lyndee Hensen, MD 11/08/2019, 9:49 AM PGY-2, [place] Intern pager: [phone], text pages welcome

## 2019-11-08 NOTE — Progress Notes (Signed)
[facility] for Infectious Disease    Date of Admission:  10/28/2019      Total days of antibiotics 10            ID: Kelsey Rodriguez is a 28 y.o. 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 No complaints today. Ready to go home. Swallowing foods better.    Medications:  . dronabinol  5 mg Oral BID AC  . enoxaparin (LOVENOX) injection  40 mg Subcutaneous Q24H  . fluconazole  200 mg Oral Daily  . polyethylene glycol  17 g Oral Daily  . Pyrimethamine-Leucovorin  50 mg Oral Daily   And  . Pyrimethamine-Leucovorin  25 mg Oral Daily  . sodium chloride flush  10-40 mL Intracatheter Q12H  . sulfaDIAZINE  1,500 mg Oral Q6H  . sulfamethoxazole-trimethoprim  1 tablet Oral Daily    Objective: Vital signs in last 24 hours: Temp:  [98.2 F (36.8 C)-98.4 F (36.9 C)] 98.2 F (36.8 C) (11/24 0805) Pulse Rate:  [78-87] 86 (11/24 0805) Resp:  [16-18] 16 (11/24 0805) BP: (101-116)/(71-88) 116/88 (11/24 0805) SpO2:  [100 %] 100 % (11/24 0805) Weight:  [82.6 kg] 82.6 kg (11/24 0500)  Physical Exam  Constitutional:  oriented to person, place, and time. appears well-developed and well-nourished. No distress.  HENT: [place]/AT, PERRLA, no scleral icterus. Normal gross visual fields.  Mouth/Throat: Oropharynx is clear and moist. No oropharyngeal exudate.  Cardiovascular: Normal rate, regular rhythm and normal heart sounds. Exam reveals no gallop and no friction rub. No murmur heard.  Pulmonary/Chest: Effort normal and breath sounds normal. No respiratory distress.  has no wheezes.  Neck = supple, no nuchal rigidity and full active ROM.  Abdominal: Soft. Bowel sounds are normal.  exhibits no distension. There is no tenderness.  Lymphadenopathy: no cervical adenopathy. No axillary adenopathy  Neurological: alert and oriented to person, place, and time.  Skin: Skin is warm and dry. No rash noted. No erythema.  Psychiatric: a normal mood and affect.  behavior is normal.    Lab Results Recent Labs    11/06/19 0548 11/07/19 0303 11/08/19 1030  WBC  --   --  3.3*  HGB  --   --  13.3  HCT  --   --  39.2  NA 133* 133*  --   K 4.4 4.8  --   CL 100 102  --   CO2 21* 23  --   BUN 8 9  --   CREATININE 1.11* 1.15*  --    Liver Panel No results for input(s): PROT, ALBUMIN, AST, ALT, ALKPHOS, BILITOT, BILIDIR, IBILI in the last 72 hours. Sedimentation Rate No results for input(s): ESRSEDRATE in the last 72 hours. C-Reactive Protein No results for input(s): CRP in the last 72 hours.  Microbiology: reviewed Studies/Results: No results found.   Assessment/Plan: CNS toxo = continue with pyrimethamine plus sulfadiazine for minimum 6 week induction therapy then secondary prophylaxis until immune markers have improved. Her pyramethamine-leucovorin + sulfadiazine medications are ready in main pharmacy to be given to her prior to discharge.    Thrush = Improved. Given swallowing difficulties will continue treating for 6 more days of fluconazole. Please send prescription to [facility] to have brought to her bed prior to discharge.    OI proph =  will continue with bactrim ds daily for PJP prophylaxis. Please send prescription to [facility] to have brought to her bed prior to discharge.    HIV/aids = poorly controlled, cd 4 count of 65/VL 137K. Hold x 2 weeks after toxo induction.   Appt scheduled for 11/18/2019 @ 9:45 a.m. with Terri Piedra, NP  I instructed her to notify the ID clinic 48 hours prior to her appointment to arrange transportation should she find she has difficulty.    Janene Madeira, MSN, NP-C [facility] [name] Pager: [phone] Office:  [phone] [place]: [phone]

## 2019-11-08 NOTE — Progress Notes (Signed)
Family Medicine progress update  Late entry. Unfortunately could not obtain at least week's supply of sulfadiazene for patient per our pharmacy team. Will likely be able to get medication in hand for patient on 11/25. Will hold overnight to get medications for patient in order for her to have a safe dispo plan and promote compliance.  Guadalupe [name] MD PGY-3 Family Medicine Resident

## 2019-11-08 NOTE — Discharge Instructions (Addendum)
You were hospitalized at [facility] due to a headache.  We expect this is from a opportunistic brain infection  which improved after antibiotics.  We are so glad you are feeling better.  Be sure to follow-up with your regularly scheduled infectious disease appointments.  Please also be sure to follow-up with our clinic/PCP.  Thank you for allowing [place] to take care of you.  - For Toxoplasmosis: Continue with pyrimethamine plus sulfadiazine for minimum 6 weeks until told to stop this medication by infectious diease.  - Oral rash: Continue treatment for 6 more days of fluconazole.    - Continue taking Bactrim daily for breathing infection prevention.  - Continue to hold your Symtuza until your follow up with infectious disease on 11/18/19. - We added Marinol to help with your appetite.  Continue taking this medication until your appetite improves.   Take care, [facility] family medicine team

## 2019-11-09 ENCOUNTER — Ambulatory Visit: Payer: Medicaid Other | Admitting: Family Medicine

## 2019-11-09 LAB — CBC
HCT: 36.5 % (ref 36.0–46.0)
Hemoglobin: 12.6 g/dL (ref 12.0–15.0)
MCH: 31.9 pg (ref 26.0–34.0)
MCHC: 34.5 g/dL (ref 30.0–36.0)
MCV: 92.4 fL (ref 80.0–100.0)
Platelets: 192 10*3/uL (ref 150–400)
RBC: 3.95 MIL/uL (ref 3.87–5.11)
RDW: 12.5 % (ref 11.5–15.5)
WBC: 2.4 10*3/uL — ABNORMAL LOW (ref 4.0–10.5)
nRBC: 0 % (ref 0.0–0.2)

## 2019-11-09 LAB — BASIC METABOLIC PANEL
Anion gap: 11 (ref 5–15)
BUN: 9 mg/dL (ref 6–20)
CO2: 21 mmol/L — ABNORMAL LOW (ref 22–32)
Calcium: 9.9 mg/dL (ref 8.9–10.3)
Chloride: 103 mmol/L (ref 98–111)
Creatinine, Ser: 1.1 mg/dL — ABNORMAL HIGH (ref 0.44–1.00)
GFR calc Af Amer: >60 mL/min (ref >60)
GFR calc non Af Amer: >60 mL/min (ref >60)
Glucose, Bld: 89 mg/dL (ref 70–99)
Potassium: 4.2 mmol/L (ref 3.5–5.1)
Sodium: 135 mmol/L (ref 135–145)

## 2019-11-09 MED ORDER — ONDANSETRON HCL 8 MG PO TABS: 8.0000 mg | ORAL_TABLET | Freq: Four times a day (QID) | ORAL | 0 refills | Status: DC | PRN

## 2019-11-10 LAB — RPR
RPR Ser Ql: REACTIVE — AB
RPR Titer: 1:8 {titer}

## 2019-11-16 ENCOUNTER — Ambulatory Visit: Payer: Medicaid Other | Admitting: Family

## 2019-11-17 ENCOUNTER — Telehealth: Payer: Self-pay

## 2019-11-17 NOTE — Telephone Encounter (Signed)
COVID-19 Pre-Screening Questions:  Do you currently have a fever (>100 F), chills or unexplained body aches? NO   Are you currently experiencing new cough, shortness of breath, sore throat, runny nose?NO .  Have you recently travelled outside the state of [place] in the last 14 days? NO .  Have you been in contact with someone that is currently pending confirmation of Covid19 testing or has been confirmed to have the Covid19 virus?  NO  **If the patient answers NO to ALL questions -  advise the patient to please call the clinic before coming to the office should any symptoms develop.     

## 2019-11-18 ENCOUNTER — Encounter: Payer: Self-pay | Admitting: Family

## 2019-11-18 ENCOUNTER — Ambulatory Visit (INDEPENDENT_AMBULATORY_CARE_PROVIDER_SITE_OTHER): Payer: Medicaid Other | Admitting: Family

## 2019-11-18 ENCOUNTER — Other Ambulatory Visit: Payer: Self-pay

## 2019-11-18 VITALS — BP 126/84 | HR 78 | Temp 98.4°F | Wt 178.8 lb

## 2019-11-18 DIAGNOSIS — B20 Human immunodeficiency virus [HIV] disease: Secondary | ICD-10-CM | POA: Diagnosis not present

## 2019-11-18 DIAGNOSIS — B582 Toxoplasma meningoencephalitis: Secondary | ICD-10-CM

## 2019-11-18 LAB — T-HELPER CELL (CD4) - (RCID CLINIC ONLY)
CD4 % Helper T Cell: 4 % — ABNORMAL LOW (ref 33–65)
CD4 T Cell Abs: 36 /uL — ABNORMAL LOW (ref 400–1790)

## 2019-11-18 MED ORDER — SYMTUZA 800-150-200-10 MG PO TABS: 1.0000 | ORAL_TABLET | Freq: Every day | ORAL | 2 refills | Status: DC

## 2019-11-18 MED ORDER — CLINDAMYCIN HCL 300 MG PO CAPS: 600.0000 mg | ORAL_CAPSULE | Freq: Four times a day (QID) | ORAL | 2 refills | Status: DC

## 2019-11-18 NOTE — Patient Instructions (Addendum)
Nice to see you.  We will check your blood work today.  STOP taking Sulfadiazine and START taking Clindamycin and Symtuza.  Medications have been sent to the pharmacy.  Plan for follow up in 3 weeks or sooner if needed with Marya Amsler or Dr. Baxter Flattery.   Have a great day and stay safe!

## 2019-11-18 NOTE — Progress Notes (Signed)
Subjective:    Patient ID: Kelsey Rodriguez, female    DOB: 07/03/1991, 28 y.o.   MRN: [id number]  Chief Complaint  Patient presents with   Follow-up    offered condoms; reports no further headaches; not taking any HIV medications; no further complaints     HPI:  Kelsey Rodriguez is a 28 y.o. female with previous medical history of HIV/AIDS, syphilis, acute lymphocytic meningitis, and adrenal insufficiency who was recently admitted to [facility] with poorly controlled HIV and progressive headaches.  CT scan on 10/28/2019 with a large area of vasogenic edema primarily involving the right temporal lobe with mass-effect of approximately 3 to 4 mm.  MRI with a 2.4 x 1.9 x 2.4 cm cortically based rim-enhancing lesion with similar vasogenic edema with 4 mm right to left shift.  Concern was for intracranial infection with meningitis and localized cerebritis or opportunistic infection including tuberculosis or toxoplasmosis.  Cryptococcal antigen was negative.  Toxoplasmosis IgG was positive.  HIV viral load found to be 137,000 with CD4 count of 65.  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 Jolayne Rodriguez comes in today for hospitalization follow up and for re-initiation of her ART therapy. All hospital notes, labs and imaging were reviewed in detail.  Ms. Jolayne Rodriguez has been taking her medications up until 2 days ago when she stopped taking her sulfadiazine due to anorexia and nausea. She has continued to take her pyrimethamine-leucovorin and Bactrim. Since stopping the sulfadiazine her symptoms of anorexia and nausea did improve. The nausea was refractory to the prescribed ondansetron. She continues to crush the sulfadiazine and eats it with pudding or ice cream. She would like to switch medications if she can. Currently without headaches, blurred vision, fevers, chills or sweats. Re-iterates that she  does not like to take pills.    Allergies  Allergen Reactions   Hydrocodone Itching and Nausea Only    Tolerates Oxycodone   Tramadol Itching and Nausea Only    Tolerates oxycodone      Outpatient Medications Prior to Visit  Medication Sig Dispense Refill   Pyrimethamine-Leucovorin 25-5 MG CAPS Take 25 mg by mouth daily. 42 capsule 0   Pyrimethamine-Leucovorin 50-25 MG CAPS Take 50 mg by mouth daily for 10 days. 42 capsule 0   sulfamethoxazole-trimethoprim (BACTRIM DS) 800-160 MG tablet Take 1 tablet by mouth daily. 30 tablet 0   sulfaDIAZINE 500 MG tablet Take 3 tablets (1,500 mg total) by mouth 4 (four) times daily. 360 tablet 0   acetaminophen (TYLENOL) 500 MG tablet Take 1,000 mg by mouth every 6 (six) hours as needed for moderate pain.     fluconazole (DIFLUCAN) 200 MG tablet Take 1 tablet (200 mg total) by mouth daily. (Patient not taking: Reported on 11/18/2019) 6 tablet 0   ondansetron (ZOFRAN) 8 MG tablet Take 1 tablet (8 mg total) by mouth every 6 (six) hours as needed for nausea. 30 tablet 0   polyethylene glycol powder (GLYCOLAX/MIRALAX) powder Take 17 g by mouth 2 (two) times daily as needed for mild constipation. (Patient not taking: Reported on 11/18/2019) 510 g 3   SUMAtriptan (IMITREX) 50 MG tablet Take 1 tablet (50 mg total) by mouth every 2 (two) hours as needed for migraine (Maximum dose: 100 mg per dose; 200 mg per 24 hours). May repeat in 2 hours if headache persists or recurs. (Patient not taking: Reported on 11/18/2019) 10 tablet 0  No facility-administered medications prior to visit.      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   Adrenal insufficiency ([place])    Anemia of chronic disease 03/11/2012   Back pain of lumbar region with sciatica 02/12/2015   Bell's palsy 08/26/2013   Bullae 05/30/2012   Chronic back pain    Chronic leg pain    bilateral knees, ankles   Fatigue    Herpes simplex  esophagitis 03/11/2012   HIV (human immunodeficiency virus infection) ([place]) 02/2012   Laceration of ankle, right 11/18/2012   Lumbar radiculopathy    Meningitis 02/18/2018   Pelvic pain    PID (acute pelvic inflammatory disease) 02/26/2018   Reflux esophagitis 03/11/2012   Tuberculosis    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   Vertigo      Past Surgical History:  Procedure Laterality Date   APPENDECTOMY  ~ [place] OF UTERUS  2008   ESOPHAGOGASTRODUODENOSCOPY  03/11/2012   Procedure: ESOPHAGOGASTRODUODENOSCOPY (EGD);  Surgeon: Lafayette Dragon, MD;  Location: [facility] ENDOSCOPY;  Service: Endoscopy;  Laterality: N/A;   ESOPHAGOGASTRODUODENOSCOPY N/A 03/07/2014   Procedure: ESOPHAGOGASTRODUODENOSCOPY (EGD);  Surgeon: Gatha Mayer, MD;  Location: [facility] ENDOSCOPY;  Service: Endoscopy;  Laterality: N/A;   LUNG BIOPSY  02/2012   TOTAL HIP ARTHROPLASTY Left 12/14/2015   Procedure: LEFT TOTAL HIP ARTHROPLASTY ANTERIOR APPROACH;  Surgeon: Mcarthur Rossetti, MD;  Location: [facility] ORS;  Service: Orthopedics;  Laterality: Left;   TOTAL HIP ARTHROPLASTY Right 04/04/2016   Procedure: RIGHT TOTAL HIP ARTHROPLASTY ANTERIOR APPROACH;  Surgeon: Mcarthur Rossetti, MD;  Location: [facility] ORS;  Service: Orthopedics;  Laterality: Right;       Review of Systems  Constitutional: Negative for appetite change, chills, diaphoresis, fatigue, fever and unexpected weight change.  Eyes:       Negative for acute change in vision  Respiratory: Negative for chest tightness, shortness of breath and wheezing.   Cardiovascular: Negative for chest pain.  Gastrointestinal: Positive for nausea. Negative for diarrhea and vomiting.  Genitourinary: Negative for dysuria, pelvic pain and vaginal discharge.  Musculoskeletal: Negative for neck pain and neck stiffness.  Skin: Negative for rash.  Neurological: Negative for seizures, syncope, weakness and headaches.  Hematological: Negative  for adenopathy. Does not bruise/bleed easily.  Psychiatric/Behavioral: Negative for hallucinations.      Objective:    BP 126/84    Pulse 78    Temp 98.4 F (36.9 C) (Oral)    Wt 178 lb 12.8 oz (81.1 kg)    BMI 29.75 kg/m  Nursing note and vital signs reviewed.  Physical Exam Constitutional:      General: She is not in acute distress.    Appearance: She is well-developed.  Cardiovascular:     Rate and Rhythm: Normal rate and regular rhythm.     Heart sounds: Normal heart sounds.  Pulmonary:     Effort: Pulmonary effort is normal.     Breath sounds: Normal breath sounds.  Skin:    General: Skin is warm and dry.  Neurological:     Mental Status: She is alert and oriented to person, place, and time.  Psychiatric:        Behavior: Behavior normal.        Thought Content: Thought content normal.        Judgment: Judgment normal.      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[place]) 11/07/2019   AIDS (acquired immune deficiency syndrome) ([place]) 11/07/2019   Cerebral edema (HCC) 10/28/2019   Midline shift of brain    Pruritus 08/29/2019   Tendinopathy of left shoulder 01/18/2019   Chronic pelvic pain in female 01/04/2019   Lower abdominal pain 06/21/2018   Syphilis 02/26/2018   Tuberculosis    Infertility, female 02/12/2018   ASCUS with positive high risk HPV cervical 09/14/2017   Acute right-sided low back pain with right-sided  sciatica 08/24/2017   Complex regional pain syndrome 02/03/2017   Headache 10/28/2016   Avascular necrosis of bone of right hip ([place]) 04/04/2016   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   Avascular necrosis of bone of left hip ([place]) 12/14/2015   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   Primary adrenal insufficiency ([place]) 01/03/2015   HIV (human immunodeficiency virus infection) ([place]) 03/16/2012   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 Jolayne Rodriguez is currently in week 2 of induction for CNS toxoplasmosis and with her regimen complicated by intolerance to Sulfazadine resulting in nausea and anorexia that is refractory to ondansetron. Tolerating pyrimethamine-leucovorin at present. Will change sulfadiazine to clindamycin 600 mg qid with continued pyrimethamine-leucovorin. Reviewed her MRI and CT scan results to show her lesions as I feel she lacks insight into the seriousness of her infection and importance of treatment. Confirms she is willing to give it her best to take the medications. Check blood work today for therapeutic drug monitoring. Continue with 6 weeks of induction with planned maintenance therapy following completion.        Relevant Medications   clindamycin (CLEOCIN) 300 MG capsule   Darunavir-Cobicisctat-Emtricitabine-Tenofovir Alafenamide (SYMTUZA) 800-150-200-10 MG TABS     Other   AIDS (acquired immune deficiency syndrome) ([place]) - Primary    Ms. Jolayne Rodriguez has poorly controlled AIDS and is currently in CDC Stage III with opportunistic infection with toxoplasmosis due to her less than optimal adherence to her ART regimen. She has now completed 2 weeks of induction therapy and will plan to restart her former regimen of Symtuza. Check genotype and blood work today to confirm no new resistance patterns. Discussed importance of taking  medication as prescribed to help prevent complications like she is currently experiencing. Will plan to follow up in 3 weeks or sooner if needed.       Relevant Medications   clindamycin (CLEOCIN) 300 MG capsule   Darunavir-Cobicisctat-Emtricitabine-Tenofovir Alafenamide (SYMTUZA) 800-150-200-10 MG TABS   Other Relevant Orders   COMPLETE METABOLIC PANEL WITH GFR   T-helper cell (CD4)- ([facility] clinic only) (Completed)   CBC w/Diff (Completed)  HIV-1 RNA ultraquant reflex to gentyp+   HIV RNA, RTPCR W/R GT (RTI, PI,INT)       I have discontinued Lorena B. Toh's sulfaDIAZINE. I am also having her start on clindamycin and Symtuza. Additionally, I am having her maintain her polyethylene glycol powder, SUMAtriptan, acetaminophen, fluconazole, sulfamethoxazole-trimethoprim, Pyrimethamine-Leucovorin, Pyrimethamine-Leucovorin, and ondansetron.   Meds ordered this encounter  Medications   clindamycin (CLEOCIN) 300 MG capsule    Sig: Take 2 capsules (600 mg total) by mouth 4 (four) times daily.    Dispense:  240 capsule    Refill:  2    Order Specific Question:   Supervising Provider    Answer:   Carlyle Basques [id number]   Darunavir-Cobicisctat-Emtricitabine-Tenofovir Alafenamide (SYMTUZA) 800-150-200-10 MG TABS    Sig: Take 1 tablet by mouth daily with breakfast.    Dispense:  30 tablet    Refill:  2    Order Specific Question:   Supervising Provider    Answer:   Carlyle Basques [[id number]]     Follow-up: Return in about 3 weeks (around 12/09/2019).   Terri Piedra, MSN, FNP-C Nurse Practitioner [facility] for Infectious Disease [place] number: [phone]

## 2019-11-18 NOTE — Assessment & Plan Note (Signed)
[name] has poorly controlled AIDS and is currently in CDC Stage III with opportunistic infection with toxoplasmosis due to her less than optimal adherence to her ART regimen. She has now completed 2 weeks of induction therapy and will plan to restart her former regimen of Symtuza. Check genotype and blood work today to confirm no new resistance patterns. Discussed importance of taking medication as prescribed to help prevent complications like she is currently experiencing. Will plan to follow up in 3 weeks or sooner if needed.

## 2019-11-18 NOTE — Assessment & Plan Note (Signed)
Kelsey Rodriguez is currently in week 2 of induction for CNS toxoplasmosis and with her regimen complicated by intolerance to Sulfazadine resulting in nausea and anorexia that is refractory to ondansetron. Tolerating pyrimethamine-leucovorin at present. Will change sulfadiazine to clindamycin 600 mg qid with continued pyrimethamine-leucovorin. Reviewed her MRI and CT scan results to show her lesions as I feel she lacks insight into the seriousness of her infection and importance of treatment. Confirms she is willing to give it her best to take the medications. Check blood work today for therapeutic drug monitoring. Continue with 6 weeks of induction with planned maintenance therapy following completion.

## 2019-11-22 ENCOUNTER — Ambulatory Visit (INDEPENDENT_AMBULATORY_CARE_PROVIDER_SITE_OTHER): Payer: Medicaid Other | Admitting: Family Medicine

## 2019-11-22 ENCOUNTER — Encounter: Payer: Self-pay | Admitting: Family Medicine

## 2019-11-22 ENCOUNTER — Other Ambulatory Visit: Payer: Self-pay

## 2019-11-22 VITALS — BP 110/68 | HR 71 | Wt 181.0 lb

## 2019-11-22 DIAGNOSIS — L299 Pruritus, unspecified: Secondary | ICD-10-CM | POA: Diagnosis not present

## 2019-11-22 DIAGNOSIS — B582 Toxoplasma meningoencephalitis: Secondary | ICD-10-CM

## 2019-11-22 MED ORDER — HYDROCORTISONE 1 % EX OINT: 1.0000 "application " | TOPICAL_OINTMENT | Freq: Two times a day (BID) | CUTANEOUS | 0 refills | Status: DC

## 2019-11-22 NOTE — Assessment & Plan Note (Signed)
Doing well and compliant with antibiotic therapy.  HAART therapy resumed at ID appointment 12/4, patient reports compliance with this.  Encourage patient to remain compliant with medication and follow-up with ID as scheduled in 3 weeks.  Still with persistent nausea but improved and able to tolerate food.  Discussed return precautions if nausea gets worse or starts vomiting, see AVS for details.

## 2019-11-22 NOTE — Progress Notes (Signed)
  Subjective:   Patient ID: Kelsey Rodriguez    DOB: 1991-06-28, 28 y.o. female   MRN: [id number]  Kelsey Rodriguez is a 28 y.o. female with a history of tuberculosis, primary adrenal insufficiency, complex regional pain syndrome, H/o CNS toxoplasmosis, avascular necrosis of the bilateral hips s/p replacements, ASCUS, female infertility, HIV/AIDS here for   Hospital follow-up - Admitted 11/13-11/25 for CNS toxoplasmosis secondary to AIDS in the setting of noncompliance with ART, CD4 count 65.  Treated empirically with sulfadiazine, pyrimethamine with leucovorin, will continue this for 6 weeks then will retest at ID follow-up.  And given PJP prophylaxis with Bactrim.  Also treated for oral candidiasis with Diflucan, will complete course on 12/1. - Had ID follow-up on 12/4 where they changed sulfadiazine to clindamycin and continue pyrimethamine.  Also restarted HAART at this appointment.  Due for follow-up in 3 weeks. - nausea is better since changing antibiotics. Not eating well still. Not vomiting. Nausea comes and goes.  Able to tolerate fluids.  - does not want depo for birth control, has had it previously with breakthrough bleeding. Has been on depo and pills in the past. Sees gynecology in [place].  Is thinking about IUD.  Itchy feet Has noticed dry, itchy and scaly feet for a while.  She previously tried a cream for this but does not remember the name of it.  She has also tried Vaseline without improvement.  She soaks her feet in vinegar and water which helps some.  She denies any changes in soaps or detergents.  Review of Systems:  Per HPI.  Medications and smoking status reviewed.  Objective:   BP 110/68   Pulse 71   Wt 181 lb (82.1 kg)   LMP 11/15/2019 (Approximate)   SpO2 98%   BMI 30.12 kg/m  Vitals and nursing note reviewed.  General: Obese female, in no acute distress with non-toxic appearance CV: regular rate and rhythm without murmurs, rubs, or gallops Lungs: clear to  auscultation bilaterally with normal work of breathing Skin: warm, dry.  Dryness noted to plantar surfaces of bilateral feet, worse in the arch. Extremities: warm and well perfused, normal tone MSK: ROM grossly intact, gait normal Neuro: Alert and oriented, speech normal  Assessment & Plan:   Pruritus Unclear etiology. Previously seen 08/2019 and 10/2019 with no red flag symptoms at that time either. Previously tried sarna lotion per chart review. Will trial low potency steroid ointment. Since vinegar soaks seem to be helping, will continue these. Return precautions discussed, see AVS.  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  HAART therapy resumed at ID appointment 12/4, patient reports compliance with this.  Encourage patient to remain compliant with medication and follow-up with ID as scheduled in 3 weeks.  Still with persistent nausea but improved and able to tolerate food.  Discussed return precautions if nausea gets worse or starts vomiting, see AVS for details.  No orders of the defined types were placed in this encounter.  Meds ordered this encounter  Medications  . hydrocortisone 1 % ointment    Sig: Apply 1 application topically 2 (two) times daily.    Dispense:  30 g    Refill:  0    Rory Percy, DO PGY-3, [facility] 11/22/2019 10:39 AM

## 2019-11-22 NOTE — Patient Instructions (Signed)
It was great to see you!  Our plans for today:  - Keep your follow-up appointment with infectious disease in a few weeks. - Try the ointment for your feet.  If this gets worse or does not get better, come back to see [place]. - Consider an IUD for birth control, talk to your gynecologist about this. - Let us know if your nausea gets worse or if you start to vomit or are unable to tolerate liquids.  Take care and seek immediate care sooner if you develop any concerns.   Dr. Johnsie [facility]

## 2019-11-22 NOTE — Assessment & Plan Note (Addendum)
Unclear etiology. Previously seen 08/2019 and 10/2019 with no red flag symptoms at that time either. Previously tried sarna lotion per chart review. Will trial low potency steroid ointment. Since vinegar soaks seem to be helping, will continue these. Return precautions discussed, see AVS.

## 2019-12-02 ENCOUNTER — Other Ambulatory Visit: Payer: Self-pay

## 2019-12-02 ENCOUNTER — Encounter (HOSPITAL_COMMUNITY): Payer: Self-pay

## 2019-12-02 ENCOUNTER — Ambulatory Visit (HOSPITAL_COMMUNITY)
Admission: EM | Admit: 2019-12-02 | Discharge: 2019-12-02 | Disposition: A | Discharge status: Home / Self Care | Payer: Medicaid Other | Attending: Family Medicine | Admitting: Family Medicine

## 2019-12-02 DIAGNOSIS — K645 Perianal venous thrombosis: Secondary | ICD-10-CM

## 2019-12-02 NOTE — ED Triage Notes (Signed)
Pt presents with rectal pain and swelling X 3 days with no relief with OTC medication.

## 2019-12-02 NOTE — Discharge Instructions (Addendum)
We drained the hemorrhoid here today.  Do sitz baths at home. Instruction sin your papers on how to do this.  Follow up as needed for continued or worsening symptoms

## 2019-12-02 NOTE — ED Provider Notes (Signed)
[place]    CSN: [id number] Arrival date & time: 12/02/19  [id number]      History   Chief Complaint Chief Complaint  Patient presents with  . Rectal Pain    HPI Kelsey Rodriguez is a 28 y.o. female.   Patient is a 28 year old female with past medical history of acute lymphocytic meningitis, adrenal insufficiency, anemia, Bell's palsy, fatigue, herpes simplex esophagitis, HIV, AIDS, meningitis, PID, tuberculosis presents today with thrombosed hemorrhoid.  This has been present and worsening since Wednesday.  She is been using Tucks pads and hydrocortisone cream without any relief.  Pain with standing, sitting.  Denies any bleeding from the area.  Denies any history of hemorrhoids.      Past Medical History:  Diagnosis Date  . 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 Adrenal insufficiency ([place])   . Anemia of chronic disease 03/11/2012  . Back pain of lumbar region with sciatica 02/12/2015  . Bell's palsy 08/26/2013  . 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 Chronic back pain   . Chronic leg pain    bilateral knees, ankles  . Fatigue   . 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 HIV (human immunodeficiency virus infection) ([place]) 02/2012  . Laceration of ankle, right 11/18/2012  . Lumbar radiculopathy   . Meningitis 02/18/2018  . Pelvic pain   . PID (acute pelvic inflammatory disease) 02/26/2018  . Reflux esophagitis 03/11/2012  . Tuberculosis   . 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 Vertigo     Patient Active Problem List   Diagnosis Date Noted  . 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 AIDS (acquired immune deficiency syndrome) ([place]) 11/07/2019  . Cerebral edema ([place]) 10/28/2019  . Midline shift of brain   . Pruritus 08/29/2019  . Tendinopathy of left shoulder 01/18/2019  . Chronic pelvic pain in female 01/04/2019  . Lower abdominal pain 06/21/2018  . Syphilis 02/26/2018  . Tuberculosis   . Infertility, female 02/12/2018  . ASCUS with positive high risk HPV  cervical 09/14/2017  . Acute right-sided low back pain with right-sided sciatica 08/24/2017  . Complex regional pain syndrome 02/03/2017  . Headache 10/28/2016  . Avascular necrosis of bone of right hip ([place]) 04/04/2016  . 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 Avascular necrosis of bone of left hip ([place]) 12/14/2015  . 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 Primary adrenal insufficiency ([place]) 01/03/2015  . HIV (human immunodeficiency virus infection) ([place]) 03/16/2012  . 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 APPENDECTOMY  ~ 2000  . DILATION AND CURETTAGE OF UTERUS  2008  . ESOPHAGOGASTRODUODENOSCOPY  03/11/2012   Procedure: ESOPHAGOGASTRODUODENOSCOPY (EGD);  Surgeon: Lafayette Dragon, MD;  Location: [facility] ENDOSCOPY;  Service: Endoscopy;  Laterality: N/A;  . ESOPHAGOGASTRODUODENOSCOPY N/A 03/07/2014   Procedure: ESOPHAGOGASTRODUODENOSCOPY (EGD);  Surgeon: Gatha Mayer, MD;  Location: [facility] ENDOSCOPY;  Service: Endoscopy;  Laterality: N/A;  . LUNG BIOPSY  02/2012  . TOTAL HIP ARTHROPLASTY Left 12/14/2015   Procedure: LEFT TOTAL HIP ARTHROPLASTY ANTERIOR APPROACH;  Surgeon: Mcarthur Rossetti, MD;  Location: [facility] ORS;  Service: Orthopedics;  Laterality: Left;  . TOTAL HIP ARTHROPLASTY Right 04/04/2016   Procedure: RIGHT TOTAL HIP ARTHROPLASTY ANTERIOR APPROACH;  Surgeon: Mcarthur Rossetti, MD;  Location: [facility] ORS;  Service: Orthopedics;  Laterality: Right;    OB History    Gravida  1   Para  0   Term  0   Preterm  0   AB  1   Living  0     SAB  1   TAB  0   Ectopic  0   Multiple  0   Live Births               Home Medications    Prior to Admission medications   Medication Sig Start Date End Date Taking? Authorizing Provider  acetaminophen (TYLENOL) 500 MG tablet Take 1,000 mg by mouth every 6 (six) hours as needed for moderate pain.    [provider]    Darunavir-Cobicisctat-Emtricitabine-Tenofovir Alafenamide (SYMTUZA) 800-150-200-10 MG TABS Take 1 tablet by mouth daily with breakfast. 11/18/19   Golden Circle, FNP  hydrocortisone 1 % ointment Apply 1 application topically 2 (two) times daily. 11/22/19   Rory Percy, DO  ondansetron (ZOFRAN) 8 MG tablet Take 1 tablet (8 mg total) by mouth every 6 (six) hours as needed for nausea. 11/09/19   Guadalupe [name], MD  Pyrimethamine-Leucovorin 25-5 MG CAPS Take 25 mg by mouth daily. 11/08/19   Guadalupe [name], MD  SUMAtriptan (IMITREX) 50 MG tablet Take 1 tablet (50 mg total) by mouth every 2 (two) hours as needed for migraine (Maximum dose: 100 mg per dose; 200 mg per 24 hours). May repeat in 2 hours if headache persists or recurs. Patient not taking: Reported on 11/18/2019 10/26/19 12/02/19  Caroline More, DO    Family History Family History  Problem Relation Age of Onset  . Heart disease Father        Vague not clearly cardiac    Social History Social History   Tobacco Use  . Smoking status: Never Smoker  . Smokeless tobacco: Never Used  Substance Use Topics  . Alcohol use: No    Alcohol/week: 0.0 standard drinks    Comment: socially  . Drug use: No     Allergies   Hydrocodone and Tramadol   Review of Systems Review of Systems  Gastrointestinal: Positive for rectal pain. Negative for abdominal distention, abdominal pain, anal bleeding, blood in stool, constipation, diarrhea, nausea and vomiting.     Physical Exam Triage Vital Signs ED Triage Vitals [12/02/19 0935]  Enc Vitals Group     BP 126/87     Pulse Rate 87     Resp 17     Temp 98.7 F (37.1 C)     Temp Source Oral     SpO2 96 %     Weight      Height      Head Circumference      Peak Flow      Pain Score 9     Pain Loc      Pain Edu?      Excl. in [place]?    No data found.  Updated Vital Signs BP 126/87 (BP Location: Right Arm)   Pulse 87   Temp 98.7 F (37.1 C) (Oral)   Resp 17   LMP  11/15/2019 (Approximate)   SpO2 96%   Visual Acuity Right Eye Distance:   Left Eye Distance:   Bilateral Distance:    Right Eye Near:   Left Eye Near:    Bilateral Near:     Physical Exam Vitals and nursing note reviewed.  Constitutional:      Comments: Appears in pain   HENT:     Head: Normocephalic and atraumatic.  Eyes:     Conjunctiva/sclera: Conjunctivae normal.  Pulmonary:     Effort:  Pulmonary effort is normal.  Genitourinary:      Comments: Approximated 1 cm thrombosed hemorrhoid.  Neurological:     Mental Status: She is alert.      UC Treatments / Results  Labs (all labs ordered are listed, but only abnormal results are displayed) Labs Reviewed - No data to display  EKG   Radiology No results found.  Procedures Incision and Drainage  Date/Time: 12/02/2019 10:46 AM Performed by: Orvan July, NP Authorized by: Orvan July, NP   Consent:    Consent obtained:  Verbal   Consent given by:  Patient   Risks discussed:  Incomplete drainage, pain and bleeding Location:    Type:  External thrombosed hemorrhoid   Size:  1   Location:  Anogenital   Anogenital location:  Rectum Pre-procedure details:    Skin preparation:  Betadine Anesthesia (see MAR for exact dosages):    Anesthesia method:  Local infiltration   Local anesthetic:  Lidocaine 2% w/o epi Procedure type:    Complexity:  Simple Procedure details:    Needle aspiration: no     Incision types:  Stab incision   Scalpel blade:  11   Drainage:  Bloody   Drainage amount:  Moderate   Wound treatment:  Wound left open   Packing materials:  None Post-procedure details:    Patient tolerance of procedure:  Tolerated well, no immediate complications   (including critical care time)  Medications Ordered in UC Medications - No data to display  Initial Impression / Assessment and Plan / UC Course  I have reviewed the triage vital signs and the nursing notes.  Pertinent labs & imaging  results that were available during my care of the patient were reviewed by me and considered in my medical decision making (see chart for details).     External thrombosed hemorrhoid-I&D here in clinic and remove clot. Patient tolerated well. Patient feeling much better after procedure We will have her go home and do sits baths Follow up as needed for continued or worsening symptoms  Final Clinical Impressions(s) / UC Diagnoses   Final diagnoses:  Thrombosed external hemorrhoid     Discharge Instructions     We drained the hemorrhoid here today.  Do sitz baths at home. Instruction sin your papers on how to do this.  Follow up as needed for continued or worsening symptoms     ED Prescriptions    None     PDMP not reviewed this encounter.   Orvan July, NP 12/02/19 1047

## 2019-12-04 ENCOUNTER — Encounter (HOSPITAL_COMMUNITY): Payer: Self-pay | Admitting: Emergency Medicine

## 2019-12-04 ENCOUNTER — Emergency Department (HOSPITAL_COMMUNITY)
Admission: EM | Admit: 2019-12-04 | Discharge: 2019-12-04 | Disposition: A | Discharge status: Home / Self Care | Payer: Medicaid Other | Attending: Emergency Medicine | Admitting: Emergency Medicine

## 2019-12-04 DIAGNOSIS — Z79899 Other long term (current) drug therapy: Secondary | ICD-10-CM | POA: Insufficient documentation

## 2019-12-04 DIAGNOSIS — Z96643 Presence of artificial hip joint, bilateral: Secondary | ICD-10-CM | POA: Insufficient documentation

## 2019-12-04 DIAGNOSIS — K644 Residual hemorrhoidal skin tags: Secondary | ICD-10-CM | POA: Diagnosis not present

## 2019-12-04 DIAGNOSIS — B20 Human immunodeficiency virus [HIV] disease: Secondary | ICD-10-CM | POA: Diagnosis not present

## 2019-12-04 MED ORDER — HYDROCORTISONE ACETATE 25 MG RE SUPP: 25.0000 mg | Freq: Two times a day (BID) | RECTAL | 0 refills | Status: DC

## 2019-12-04 NOTE — ED Triage Notes (Signed)
Pt states she was diagnosed with hemorrhoids at the UC this past Thursday and had one lanced which helped with her pain: Pt states the pain and "hard knot" came back yesterday. Pt denies any bleeding just pain.

## 2019-12-04 NOTE — ED Provider Notes (Signed)
[facility] EMERGENCY DEPARTMENT Provider Note   CSN: [id number] Arrival date & time: 12/04/19  [id number]     History Chief Complaint  Patient presents with  . Hemorrhoids    Kelsey Rodriguez is a 28 y.o. female.  28 year old female with multiple medical problems listed below presents with complaint of a painful hemorrhoid.  Patient was seen in urgent care 2 days ago, had an area lanced and reports feeling better afterwards however reports return of pain.  Patient is taking leftover oxycodone with minimal relief of her pain.  No other complaints or concerns.        Past Medical History:  Diagnosis Date  . 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 Adrenal insufficiency ([place])   . Anemia of chronic disease 03/11/2012  . Back pain of lumbar region with sciatica 02/12/2015  . Bell's palsy 08/26/2013  . 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 Chronic back pain   . Chronic leg pain    bilateral knees, ankles  . Fatigue   . 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 HIV (human immunodeficiency virus infection) ([place]) 02/2012  . Laceration of ankle, right 11/18/2012  . Lumbar radiculopathy   . Meningitis 02/18/2018  . Pelvic pain   . PID (acute pelvic inflammatory disease) 02/26/2018  . Reflux esophagitis 03/11/2012  . Tuberculosis   . 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 Vertigo     Patient Active Problem List   Diagnosis Date Noted  . 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 AIDS (acquired immune deficiency syndrome) ([place]) 11/07/2019  . Cerebral edema ([place]) 10/28/2019  . Midline shift of brain   . Pruritus 08/29/2019  . Tendinopathy of left shoulder 01/18/2019  . Chronic pelvic pain in female 01/04/2019  . Lower abdominal pain 06/21/2018  . Syphilis 02/26/2018  . Tuberculosis   . Infertility, female 02/12/2018  . 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 Acute right-sided low back pain with right-sided sciatica 08/24/2017  . Complex regional pain  syndrome 02/03/2017  . Headache 10/28/2016  . Avascular necrosis of bone of right hip ([place]) 04/04/2016  . 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 Avascular necrosis of bone of left hip ([place]) 12/14/2015  . 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 Primary adrenal insufficiency ([place]) 01/03/2015  . HIV (human immunodeficiency virus infection) ([place]) 03/16/2012  . 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 APPENDECTOMY  ~ 2000  . DILATION AND CURETTAGE OF UTERUS  2008  . ESOPHAGOGASTRODUODENOSCOPY  03/11/2012   Procedure: ESOPHAGOGASTRODUODENOSCOPY (EGD);  Surgeon: Lafayette Dragon, MD;  Location: [facility] ENDOSCOPY;  Service: Endoscopy;  Laterality: N/A;  . ESOPHAGOGASTRODUODENOSCOPY N/A 03/07/2014   Procedure: ESOPHAGOGASTRODUODENOSCOPY (EGD);  Surgeon: Gatha Mayer, MD;  Location: [facility] ENDOSCOPY;  Service: Endoscopy;  Laterality: N/A;  . LUNG BIOPSY  02/2012  . TOTAL HIP ARTHROPLASTY Left 12/14/2015   Procedure: LEFT TOTAL HIP ARTHROPLASTY ANTERIOR APPROACH;  Surgeon: Mcarthur Rossetti, MD;  Location: [facility] ORS;  Service: Orthopedics;  Laterality: Left;  . TOTAL HIP ARTHROPLASTY Right 04/04/2016   Procedure: RIGHT TOTAL HIP ARTHROPLASTY ANTERIOR APPROACH;  Surgeon: Mcarthur Rossetti, MD;  Location: [facility] ORS;  Service: Orthopedics;  Laterality: Right;     OB History    Gravida  1   Para  0   Term  0   Preterm  0   AB  1   Living  0  SAB  1   TAB  0   Ectopic  0   Multiple  0   Live Births              Family History  Problem Relation Age of Onset  . Heart disease Father        Vague not clearly cardiac    Social History   Tobacco Use  . Smoking status: Never Smoker  . Smokeless tobacco: Never Used  Substance Use Topics  . Alcohol use: No    Alcohol/week: 0.0 standard drinks    Comment: socially  . Drug use: No    Home Medications Prior to Admission  medications   Medication Sig Start Date End Date Taking? Authorizing Provider  acetaminophen (TYLENOL) 500 MG tablet Take 1,000 mg by mouth every 6 (six) hours as needed for moderate pain.    [provider]  Darunavir-Cobicisctat-Emtricitabine-Tenofovir Alafenamide (SYMTUZA) 800-150-200-10 MG TABS Take 1 tablet by mouth daily with breakfast. 11/18/19   Golden Circle, FNP  hydrocortisone (ANUSOL-HC) 25 MG suppository Place 1 suppository (25 mg total) rectally 2 (two) times daily. 12/04/19   Tacy Learn, PA-C  hydrocortisone 1 % ointment Apply 1 application topically 2 (two) times daily. 11/22/19   Rory Percy, DO  ondansetron (ZOFRAN) 8 MG tablet Take 1 tablet (8 mg total) by mouth every 6 (six) hours as needed for nausea. 11/09/19   Guadalupe [name], MD  Pyrimethamine-Leucovorin 25-5 MG CAPS Take 25 mg by mouth daily. 11/08/19   Guadalupe [name], MD  SUMAtriptan (IMITREX) 50 MG tablet Take 1 tablet (50 mg total) by mouth every 2 (two) hours as needed for migraine (Maximum dose: 100 mg per dose; 200 mg per 24 hours). May repeat in 2 hours if headache persists or recurs. Patient not taking: Reported on 11/18/2019 10/26/19 12/02/19  Caroline More, DO    Allergies    Hydrocodone and Tramadol  Review of Systems   Review of Systems  Constitutional: Negative for fever.  Gastrointestinal: Positive for rectal pain. Negative for abdominal pain, constipation and diarrhea.  Skin: Negative for rash and wound.  Allergic/Immunologic: Positive for immunocompromised state.  Hematological: Does not bruise/bleed easily.  All other systems reviewed and are negative.   Physical Exam Updated Vital Signs BP 105/74 (BP Location: Left Arm)   Pulse 78   Temp 99.1 F (37.3 C) (Oral)   Resp 16   LMP 11/15/2019 (Approximate)   SpO2 100%   Physical Exam Vitals and nursing note reviewed.  Constitutional:      General: She is not in acute distress.    Appearance: She is well-developed. She  is not diaphoretic.  HENT:     Head: Normocephalic and atraumatic.  Pulmonary:     Effort: Pulmonary effort is normal.  Genitourinary:    Rectum: External hemorrhoid present.  Skin:    General: Skin is warm and dry.  Neurological:     Mental Status: She is alert and oriented to person, place, and time.  Psychiatric:        Behavior: Behavior normal.     ED Results / Procedures / Treatments   Labs (all labs ordered are listed, but only abnormal results are displayed) Labs Reviewed - No data to display  EKG None  Radiology No results found.  Procedures Procedures (including critical care time)  Medications Ordered in ED Medications - No data to display  ED Course  I have reviewed the triage vital signs and the nursing notes.  Pertinent  labs & imaging results that were available during my care of the patient were reviewed by me and considered in my medical decision making (see chart for details).  Clinical Course as of Dec 03 1137  Sun Dec 04, 6719  199 28 year old female with complaint of painful hemorrhoid.  On exam patient has an external hemorrhoid that is tender, no active bleeding.  Patient is already on stool softeners, will give Anusol, recommend she use Motrin and Tylenol for pain and avoid narcotic pain medications.  Recommend sitz bath's, patient referred to general surgery for follow-up if this hemorrhoid continues to bother her.   [LM]    Clinical Course User Index [LM] Roque Lias   MDM Rules/Calculators/A&P                      Final Clinical Impression(s) / ED Diagnoses Final diagnoses:  External hemorrhoids    Rx / DC Orders ED Discharge Orders         Ordered    hydrocortisone (ANUSOL-HC) 25 MG suppository  2 times daily     12/04/19 1017           Roque Lias 12/04/19 1138    Carmin Muskrat, MD 12/05/19 [phone]

## 2019-12-04 NOTE — Discharge Instructions (Signed)
Follow-up with general surgery. Use Anusol as needed as prescribed. Keep stools soft with your regular stool softeners, limit time sitting on the toilet. Take Motrin and Tylenol as needed for pain.  Avoid narcotic medications such as your oxycodone.

## 2019-12-05 DIAGNOSIS — K645 Perianal venous thrombosis: Secondary | ICD-10-CM | POA: Diagnosis not present

## 2019-12-12 ENCOUNTER — Ambulatory Visit: Payer: Medicaid Other | Admitting: Family

## 2019-12-21 LAB — CBC WITH DIFFERENTIAL/PLATELET
Absolute Monocytes: 313 cells/uL (ref 200–950)
Basophils Absolute: 10 cells/uL (ref 0–200)
Basophils Relative: 0.4 %
Eosinophils Absolute: 80 cells/uL (ref 15–500)
Eosinophils Relative: 3.2 %
HCT: 34.6 % — ABNORMAL LOW (ref 35.0–45.0)
Hemoglobin: 11.4 g/dL — ABNORMAL LOW (ref 11.7–15.5)
Lymphs Abs: 918 cells/uL (ref 850–3900)
MCH: 31.1 pg (ref 27.0–33.0)
MCHC: 32.9 g/dL (ref 32.0–36.0)
MCV: 94.5 fL (ref 80.0–100.0)
MPV: 11.3 fL (ref 7.5–12.5)
Monocytes Relative: 12.5 %
Neutro Abs: 1180 cells/uL — ABNORMAL LOW (ref 1500–7800)
Neutrophils Relative %: 47.2 %
Platelets: 208 10*3/uL (ref 140–400)
RBC: 3.66 10*6/uL — ABNORMAL LOW (ref 3.80–5.10)
RDW: 12.8 % (ref 11.0–15.0)
Total Lymphocyte: 36.7 %
WBC: 2.5 10*3/uL — ABNORMAL LOW (ref 3.8–10.8)

## 2019-12-21 LAB — COMPLETE METABOLIC PANEL WITH GFR
AG Ratio: 1.3 (calc) (ref 1.0–2.5)
ALT: 14 U/L (ref 6–29)
AST: 16 U/L (ref 10–30)
Albumin: 4.5 g/dL (ref 3.6–5.1)
Alkaline phosphatase (APISO): 81 U/L (ref 31–125)
BUN: 7 mg/dL (ref 7–25)
CO2: 23 mmol/L (ref 20–32)
Calcium: 9.7 mg/dL (ref 8.6–10.2)
Chloride: 106 mmol/L (ref 98–110)
Creat: 0.94 mg/dL (ref 0.50–1.10)
GFR, Est African American: 94 mL/min/{1.73_m2} (ref >=60)
GFR, Est Non African American: 81 mL/min/{1.73_m2} (ref >=60)
Globulin: 3.5 g/dL (calc) (ref 1.9–3.7)
Glucose, Bld: 87 mg/dL (ref 65–99)
Potassium: 4.2 mmol/L (ref 3.5–5.3)
Sodium: 140 mmol/L (ref 135–146)
Total Bilirubin: 0.3 mg/dL (ref 0.2–1.2)
Total Protein: 8 g/dL (ref 6.1–8.1)

## 2019-12-21 LAB — HIV RNA, RTPCR W/R GT (RTI, PI,INT)
HIV 1 RNA Quant: 71900 copies/mL — ABNORMAL HIGH
HIV-1 RNA Quant, Log: 4.86 Log copies/mL — ABNORMAL HIGH

## 2019-12-21 LAB — HIV-1 INTEGRASE GENOTYPE

## 2019-12-21 LAB — HIV-1 GENOTYPE: HIV-1 Genotype: DETECTED — AB

## 2020-01-04 ENCOUNTER — Encounter: Payer: Self-pay | Admitting: Family Medicine

## 2020-01-04 ENCOUNTER — Ambulatory Visit (INDEPENDENT_AMBULATORY_CARE_PROVIDER_SITE_OTHER): Payer: Medicaid Other | Admitting: Family Medicine

## 2020-01-04 ENCOUNTER — Other Ambulatory Visit: Payer: Self-pay

## 2020-01-04 VITALS — BP 102/70 | HR 85 | Ht 65.0 in | Wt 183.4 lb

## 2020-01-04 DIAGNOSIS — R42 Dizziness and giddiness: Secondary | ICD-10-CM

## 2020-01-04 LAB — POCT URINE PREGNANCY: Preg Test, Ur: NEGATIVE

## 2020-01-04 LAB — GLUCOSE, POCT (MANUAL RESULT ENTRY): POC Glucose: 82 mg/dl (ref 70–99)

## 2020-01-04 LAB — POCT HEMOGLOBIN: Hemoglobin: 12.3 g/dL (ref 11–14.6)

## 2020-01-04 NOTE — Patient Instructions (Addendum)
It was a pleasure seeing you today  You were seen form dizziness.  I have referred you to Neurology to further evaluate.  They will call you with an appointment.  I am also ordering some blood work and urine preg test.  I will call you if the results are abnormal.  Take Zofran for your dizziness and motion sickeness.  Follow up with your PCP as needed  Have a great day Carollee Leitz MD   Dizziness Dizziness is a common problem. It makes you feel unsteady or light-headed. You may feel like you are about to pass out (faint). Dizziness can lead to getting hurt if you stumble or fall. Dizziness can be caused by many things, including:  Medicines.  Not having enough water in your body (dehydration).  Illness. Follow these instructions at home: Eating and drinking   Drink enough fluid to keep your pee (urine) clear or pale yellow. This helps to keep you from getting dehydrated. Try to drink more clear fluids, such as water.  Do not drink alcohol.  Limit how much caffeine you drink or eat, if your doctor tells you to do that.  Limit how much salt (sodium) you drink or eat, if your doctor tells you to do that. Activity   Avoid making quick movements. ? When you stand up from sitting in a chair, steady yourself until you feel okay. ? In the morning, first sit up on the side of the bed. When you feel okay, stand slowly while you hold onto something. Do this until you know that your balance is fine.  If you need to stand in one place for a long time, move your legs often. Tighten and relax the muscles in your legs while you are standing.  Do not drive or use heavy machinery if you feel dizzy.  Avoid bending down if you feel dizzy. Place items in your home so you can reach them easily without leaning over. Lifestyle  Do not use any products that contain nicotine or tobacco, such as cigarettes and e-cigarettes. If you need help quitting, ask your doctor.  Try to lower your stress  level. You can do this by using methods such as yoga or meditation. Talk with your doctor if you need help. General instructions  Watch your dizziness for any changes.  Take over-the-counter and prescription medicines only as told by your doctor. Talk with your doctor if you think that you are dizzy because of a medicine that you are taking.  Tell a friend or a family member that you are feeling dizzy. If he or she notices any changes in your behavior, have this person call your doctor.  Keep all follow-up visits as told by your doctor. This is important. Contact a doctor if:  Your dizziness does not go away.  Your dizziness or light-headedness gets worse.  You feel sick to your stomach (nauseous).  You have trouble hearing.  You have new symptoms.  You are unsteady on your feet.  You feel like the room is spinning. Get help right away if:  You throw up (vomit) or have watery poop (diarrhea), and you cannot eat or drink anything.  You have trouble: ? Talking. ? Walking. ? Swallowing. ? Using your arms, hands, or legs.  You feel generally weak.  You are not thinking clearly, or you have trouble forming sentences. A friend or family member may notice this.  You have: ? Chest pain. ? Pain in your belly (abdomen). ? Shortness of  breath. ? Sweating.  Your vision changes.  You are bleeding.  You have a very bad headache.  You have neck pain or a stiff neck.  You have a fever. These symptoms may be an emergency. Do not wait to see if the symptoms will go away. Get medical help right away. Call your local emergency services (911 in the [place] Do not drive yourself to the hospital. Summary  Dizziness makes you feel unsteady or light-headed. You may feel like you are about to pass out (faint).  Drink enough fluid to keep your pee (urine) clear or pale yellow. Do not drink alcohol.  Avoid making quick movements if you feel dizzy.  Watch your dizziness for any  changes. This information is not intended to replace advice given to you by your health care provider. Make sure you discuss any questions you have with your health care provider. Document Revised: 12/04/2017 Document Reviewed: 12/18/2016 Elsevier Patient Education  [place].

## 2020-01-04 NOTE — Progress Notes (Signed)
  Patient Name: Kelsey Rodriguez Date of Birth: 1990/12/20 Date of Visit: 01/04/20 PCP: Gerlene Fee, DO  Chief Complaint: Dizziness and spinning  Subjective: [name] is a pleasant 29 y.o. with medical history significant for HIV on HAART therapy, Vertigo, TB, CNS Toxoplasmosis presenting today for dizziness and spinning for 5-6 days.   Vertigo Patient reports dizziness and spinning that started approximately 5-6 days ago.  Worse with activities and bending over.  Also occurs when driving or sitting in car.  She reports that she is unable to look out the window as she get nauseous from the drive. She states that she feels like the room is spinning at times. She reports that she has a history of vertigo and this is similar to previous episodes.  She previously had taken meclizine that helped.  She also reports some light headaches that are relieved with Tylenol.She has also recently restarted her HAART per ID. She was recently admitted to the hospital for CNS Toxoplasmosis and she reports since discharge she feels like she is a little forgetful at times. Denies any tinnitus, hearing loss, ear pain or discharge.  She does endorse some blurry vision at times and is supposed to wear glasses.  She has an appointment with the eye doctor next week, She denies any chest pain, SOD, abdominal pain or vomiting. Reports no loss of appetite, diarrhea or constipation. ROS: Per HPI.   I have reviewed the patient's medical, surgical, family, and social history as appropriate.  Vitals:   01/04/20 0931  BP: 102/70  Pulse: 85  SpO2: 99%    General: Alert and oriented, no apparent distress  Eyes: PEERLA ENTM: No pharyengeal erythema, Dix-Hallpick maneuver negative for vertigo or nystagmus Neck: nontender Cardiovascular: RRR with no murmurs noted Respiratory: CTA bilaterally  Gastrointestinal: Bowel sounds present. No abdominal pain MSK: Upper extremity strength 5/5 bilaterally, Lower extremity  strength 5/5 bilaterally Derm: No rashes noted Neuro: CNIII-XII intact, gait normal, motor and sensory intact Psych: Behavior and speech appropriate to situation  Vertigo Given that the patient has history of Vertigo that was relieved with Meclizine this is mostly cause of her symptoms today.  Also considered on differential BPPV, but less likely given Dix-Hallpick maneuver negative.  Also considered side effects of HAART as a cause of dizziness.  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 Considered orthostatic hypotension, low hemoglobin, hypoglycemia in differentials as well. -Hemoglobin WNL -CBG WNL -BMet WNL -Hepatic Function WNL -urine pregnancy negaitve -orthostatic vitals, soft blood pressures -cortisol level -refer to neurology for further evaluation -Can continue Zofran for motion nausea -Follow up with PCP as needed  Carollee Leitz, MD  [facility] Medicine Teaching Service

## 2020-01-05 LAB — BASIC METABOLIC PANEL
BUN/Creatinine Ratio: 9 (ref 9–23)
BUN: 7 mg/dL (ref 6–20)
CO2: 21 mmol/L (ref 20–29)
Calcium: 9.7 mg/dL (ref 8.7–10.2)
Chloride: 103 mmol/L (ref 96–106)
Creatinine, Ser: 0.81 mg/dL (ref 0.57–1.00)
GFR calc Af Amer: 112 mL/min/{1.73_m2} (ref >59)
GFR calc non Af Amer: 97 mL/min/{1.73_m2} (ref >59)
Glucose: 78 mg/dL (ref 65–99)
Potassium: 4.2 mmol/L (ref 3.5–5.2)
Sodium: 138 mmol/L (ref 134–144)

## 2020-01-05 LAB — HEPATIC FUNCTION PANEL
ALT: 13 IU/L (ref 0–32)
AST: 17 IU/L (ref 0–40)
Albumin: 4.8 g/dL (ref 3.8–4.8)
Alkaline Phosphatase: 109 IU/L (ref 39–117)
Bilirubin Total: 0.3 mg/dL (ref 0.0–1.2)
Bilirubin, Direct: 0.08 mg/dL (ref 0.00–0.40)
Total Protein: 8.2 g/dL (ref 6.0–8.5)

## 2020-01-05 LAB — CORTISOL: Cortisol: 4.6 ug/dL

## 2020-01-06 NOTE — Assessment & Plan Note (Addendum)
Given that the patient has history of Vertigo that was relieved with Meclizine this is mostly cause of her symptoms today.  Also considered on differential BPPV, but less likely given Dix-Hallpick maneuver negative.  Also considered side effects of HAART as a cause of dizziness.  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 Considered orthostatic hypotension, low hemoglobin, hypoglycemia in differentials as well. -Hemoglobin WNL -CBG WNL -BMet WNL -Hepatic Function WNL -urine pregnancy negaitve -orthostatic vitals, soft blood pressures -cortisol level -refer to neurology for further evaluation -Can continue Zofran for motion nausea -Follow up with PCP as needed

## 2020-01-16 DIAGNOSIS — H538 Other visual disturbances: Secondary | ICD-10-CM | POA: Diagnosis not present

## 2020-01-16 DIAGNOSIS — H532 Diplopia: Secondary | ICD-10-CM | POA: Diagnosis not present

## 2020-01-16 DIAGNOSIS — H04123 Dry eye syndrome of bilateral lacrimal glands: Secondary | ICD-10-CM | POA: Diagnosis not present

## 2020-01-26 ENCOUNTER — Ambulatory Visit: Payer: Medicaid Other | Admitting: Family Medicine

## 2020-01-26 ENCOUNTER — Other Ambulatory Visit: Payer: Self-pay

## 2020-01-26 ENCOUNTER — Ambulatory Visit: Payer: Medicaid Other | Attending: Internal Medicine

## 2020-01-26 DIAGNOSIS — Z20822 Contact with and (suspected) exposure to covid-19: Secondary | ICD-10-CM | POA: Diagnosis not present

## 2020-01-26 DIAGNOSIS — S46012A Strain of muscle(s) and tendon(s) of the rotator cuff of left shoulder, initial encounter: Secondary | ICD-10-CM | POA: Diagnosis not present

## 2020-01-26 DIAGNOSIS — S46019A Strain of muscle(s) and tendon(s) of the rotator cuff of unspecified shoulder, initial encounter: Secondary | ICD-10-CM

## 2020-01-26 HISTORY — DX: Strain of muscle(s) and tendon(s) of the rotator cuff of unspecified shoulder, initial encounter: S46.019A

## 2020-01-26 MED ORDER — NAPROXEN 500 MG PO TABS: 500.0000 mg | ORAL_TABLET | Freq: Two times a day (BID) | ORAL | 0 refills | Status: DC

## 2020-01-26 NOTE — Progress Notes (Signed)
   CHIEF COMPLAINT / HPI:  Rotator cuff strain Kelsey Rodriguez is coming into clinic today to be seen for shoulder pain.  She has noted sharp/aching pain in her left shoulder for roughly the past 2 weeks.  She notices this pain most when she is dressing herself and has difficulty lifting her arms overhead.  She also notices that she has trouble reaching behind her.  She does not remember any traumatic injury.  This feels very similar to a previous experience roughly 1 year ago when she was diagnosed with a rotator cuff injury.  She would like to know what can be done for her at this time.  PERTINENT  PMH / PSH: Previous rotator cuff injury   OBJECTIVE: BP 102/70   Pulse 86   Wt 183 lb 4 oz (83.1 kg)   LMP 01/05/2020   SpO2 99%   BMI 30.49 kg/m   Shoulder: Inspection reveals no abnormalities, atrophy or asymmetry. Palpation is normal with no tenderness over AC joint or bicipital groove. ROM is full in all planes although she does have significant pain with abduction of the left shoulder and internal rotation. Rotator cuff strength decreased with abduction due to pain.  Decreased internal rotation strength due to pain. Positive Hawkins test. Normal scapular function observed. No painful arc and no drop arm sign. No apprehension sign   ASSESSMENT / PLAN:  Rotator cuff strain Most likely rotator cuff strain.  Supraspinatus and subscapularis most likely affected based on physical exam. -At home exercises provided -Thera-Band provided -Naproxen 500 twice daily as needed -Return to clinic if no improvement in 3 to 4 weeks     Matilde Haymaker, MD [facility]

## 2020-01-26 NOTE — Assessment & Plan Note (Signed)
Most likely rotator cuff strain.  Supraspinatus and subscapularis most likely affected based on physical exam. -At home exercises provided -Thera-Band provided -Naproxen 500 twice daily as needed -Return to clinic if no improvement in 3 to 4 weeks

## 2020-01-26 NOTE — Patient Instructions (Signed)
It looks like you have a rotator cuff injury or strain.  The first step in treating these problems is anti-inflammatories and exercises.  Please follow the instructions in the paper for you exercises.  If you do not have any improvement in the next 3-4 weeks, let us know and we can schedule formal physical therapy.  You can also take naproxen to help with the discomfort.

## 2020-01-27 LAB — NOVEL CORONAVIRUS, NAA: SARS-CoV-2, NAA: NOT DETECTED

## 2020-01-31 ENCOUNTER — Encounter: Payer: Self-pay | Admitting: Neurology

## 2020-01-31 ENCOUNTER — Ambulatory Visit: Payer: Medicaid Other | Admitting: Neurology

## 2020-01-31 ENCOUNTER — Other Ambulatory Visit: Payer: Self-pay

## 2020-01-31 VITALS — Temp 96.8°F | Ht 65.0 in | Wt 187.0 lb

## 2020-01-31 DIAGNOSIS — B20 Human immunodeficiency virus [HIV] disease: Secondary | ICD-10-CM

## 2020-01-31 DIAGNOSIS — R519 Headache, unspecified: Secondary | ICD-10-CM | POA: Diagnosis not present

## 2020-01-31 DIAGNOSIS — G0490 Encephalitis and encephalomyelitis, unspecified: Secondary | ICD-10-CM

## 2020-01-31 DIAGNOSIS — R42 Dizziness and giddiness: Secondary | ICD-10-CM

## 2020-01-31 DIAGNOSIS — Z8619 Personal history of other infectious and parasitic diseases: Secondary | ICD-10-CM

## 2020-01-31 DIAGNOSIS — G0491 Myelitis, unspecified: Secondary | ICD-10-CM | POA: Insufficient documentation

## 2020-01-31 HISTORY — DX: Encephalitis and encephalomyelitis, unspecified: G04.90

## 2020-01-31 MED ORDER — AMITRIPTYLINE HCL 25 MG PO TABS: ORAL_TABLET | ORAL | 5 refills | Status: DC

## 2020-01-31 NOTE — Patient Instructions (Addendum)
You were treated in November for an infection.  I am glad to hear that you are feeling better and that your headaches are improved, nevertheless, for your residual headaches I recommend we restart you on amitriptyline. Elavil (generic name: amitriptyline) 25 mg: Take half a pill daily at bedtime for one week, then one pill daily at bedtime for one week, then one and a half pills daily at bedtime for one week, then 2 pills daily at bedtime thereafter. Common side effects reported are: mouth dryness, drowsiness, confusion, dizziness.  Your neurological exam is nonfocal thankfully.  Please follow-up routinely to see the nurse practitioner in 3 months.

## 2020-01-31 NOTE — Progress Notes (Signed)
Subjective:    Patient ID: Kelsey Rodriguez is a 29 y.o. female.  HPI     Interim history:   Kelsey Rodriguez is a 29 year-old right-handed woman with an underlying complex medical history of HIV, TB, aseptic meningitis for which she was in the hospital in October 2017, adrenal insufficiency, bilateral necrosis of the hip, s/p b/l TKAs, abnormal brain MRI, recent concern for CNS toxoplasmosis with inpatient stay in November 2020, who presents for evaluation of her recurrent headaches and dizziness.  The patient is unaccompanied today.  I last saw her on 03/24/2017 for recurrent headaches at which time she reported doing well on amitriptyline.  She did not return for follow-up after that.    Today, 01/31/2020:  She reports doing better with the HAs, has tried Imitrex which is marginally helpful.  She no longer has amitriptyline.  She feels better since her hospitalization.  She had a brain MRI with and without contrast on 10/28/2019 and I reviewed the results: IMPRESSION: 1. 2.4 x 1.9 x 2.4 cm cortically based rim enhancing lesion involving the right temporal lobe with associated scattered leptomeningeal and dural enhancement within the adjacent right frontotemporal region. Associated vasogenic edema with up to 4 mm of right-to-left midline shift. Given the history of HIV, primary differential consideration consists of intracranial infection with meningitis and localized cerebritis, including opportunistic infections such as TB or toxoplasmosis. No frank restricted diffusion to suggest intracerebral abscess. Correlation with CSF fluid analysis recommended. Sequelae of IRIS could also be considered in the correct clinical setting. High-grade glioma could also have this appearance, although is felt to be less likely and should only be considered if alternate etiologies are excluded. 2. Underlying mildly advanced generalized cerebral volume loss for age, which could reflect the sequelae of HIV  encephalitis.  She was treated with antibiotics for toxoplasmosis concern, she did not have a lumbar puncture, neurology and neurosurgery were consulted during the hospitalization.  She had a recent follow-up with her ID specialist since her hospitalization.  She had a recent eye examination last week.  She denies any one-sided weakness or numbness or tingling, nuchal stiffness, or severe headaches, she has milder headaches that are nearly daily.  She recalls doing well on amitriptyline and would be willing to try it again.   She has had intermittent dizzy spells, she feels that her moving or objects moving near her makes it worse.  Sometimes bending down and standing up quickly makes it worse, no actual nausea or real spinning sensation.  She has not fallen.  She does not feel lightheaded when suddenly standing up.  She tries to hydrate well.  She does not drink caffeine daily.   She has had midline low back pain without radiation.  This has been going on for several months, she has seen Dr. Ninfa Linden.  She had a lumbar spine MRI without contrast on 10/21/2019 and I reviewed the results: IMPRESSION: 1. Unchanged minimal degenerative disc disease at L5-S1. No stenosis or impingement.  The patient's allergies, current medications, family history, past medical history, past social history, past surgical history and problem list were reviewed and updated as appropriate.    Previously (copied from previous notes for reference):    I first met her on 01/07/2017 at the request of her primary care provider, at which time she reported a several month history of recurrent headaches. She had a recent brain MRI which we reviewed. She had tried gabapentin for headache prevention which she felt was not helpful.  I suggested a trial of amitriptyline with gradual titration. I did order a sleep study based on her complaints of snoring, nonrestorative sleep, morning headaches as well as nocturia. She had a baseline sleep  study on 01/19/2017 which showed no significant OSA. Her sleep efficiency was high normal at 96%, sleep latency 3.5 minutes, REM latency 45.5 minutes which is mildly reduced. She had an increased percentage of stage II sleep, absence of slow-wave sleep and normal percentage of REM sleep. She had a total AHI of 1.1 per hour, average oxygen saturation was 95%, nadir was 88%.     01/07/2017: She reports recurrent headaches, sometimes throbbing, sometimes tightness. Has been going for a few months, slightly better in the past month or so. Had nearly daily headaches for a while, less frequent at this time. Tried gabapentin 300 mg bid, but did not help. She has occasional blurry vision with her headache, no family history of migraines, no personal history of migraines. She does not sleep very well and is tired during the day, does not typically nap. She has not worked in years but is going to start a part-time job as I understand. She has been seen by an optometrist. She is a nonsmoker, does not use illicit drugs, drinks alcohol socially, does not utilize any daily caffeine. Aleve has been helpful for her headaches, sometimes Tylenol helps. Headache location is bifrontal or all over, sometimes starts in the front and radiates to the back, typically not one-sided, typically no nausea or vomiting. She has a history of Bell's palsy on the left side over 5 years ago, she says she was in [place] when she first had this. Since then she had some recurrence of Bell's palsy type symptoms but denies any other one-sided weakness or numbness or tingling. Does not know if she snores, does not sleep well, has nocturia 1-2 times per night. wakes up with HA at times. Has LBP and was supposed to have ESI, but opted out. She has woken herself up with gasping.  She had a brain MRI with and without contrast on 11/21/2016: IMPRESSION: Bilateral cerebral white matter disease that is new from 2014. Asymmetric appearance and lack of  atrophy would favor early PML over HIV encephalitis. In this young female, noninfectious demyelination is also considered.  Her Past Medical History Is Significant For: Past Medical History:  Diagnosis Date  . 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 Adrenal insufficiency ([place])   . Anemia of chronic disease 03/11/2012  . Back pain of lumbar region with sciatica 02/12/2015  . Bell's palsy 08/26/2013  . 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 Chronic back pain   . Chronic leg pain    bilateral knees, ankles  . Fatigue   . 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 HIV (human immunodeficiency virus infection) ([place]) 02/2012  . Laceration of ankle, right 11/18/2012  . Lumbar radiculopathy   . Meningitis 02/18/2018  . Pelvic pain   . PID (acute pelvic inflammatory disease) 02/26/2018  . Reflux esophagitis 03/11/2012  . Tuberculosis   . 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 Vertigo     Her Past Surgical History Is Significant For: Past Surgical History:  Procedure Laterality Date  . APPENDECTOMY  ~ 2000  . DILATION AND CURETTAGE OF UTERUS  2008  . ESOPHAGOGASTRODUODENOSCOPY  03/11/2012   Procedure: ESOPHAGOGASTRODUODENOSCOPY (EGD);  Surgeon: Lafayette Dragon, MD;  [facility] ENDOSCOPY;  Service: Endoscopy;  Laterality: N/A;  . ESOPHAGOGASTRODUODENOSCOPY N/A 03/07/2014   Procedure: ESOPHAGOGASTRODUODENOSCOPY (EGD);  Surgeon:  Gatha Mayer, MD;  Location: [facility];  Service: Endoscopy;  Laterality: N/A;  . LUNG BIOPSY  02/2012  . TOTAL HIP ARTHROPLASTY Left 12/14/2015   Procedure: LEFT TOTAL HIP ARTHROPLASTY ANTERIOR APPROACH;  Surgeon: Mcarthur Rossetti, MD;  Location: [facility];  Service: Orthopedics;  Laterality: Left;  . TOTAL HIP ARTHROPLASTY Right 04/04/2016   Procedure: RIGHT TOTAL HIP ARTHROPLASTY ANTERIOR APPROACH;  Surgeon: Mcarthur Rossetti, MD;  Location: [facility];  Service: Orthopedics;  Laterality: Right;    Her Family History Is Significant For: Family History  Problem  Relation Age of Onset  . Heart disease Father        Vague not clearly cardiac    Her Social History Is Significant For: Social History   Socioeconomic History  . Marital status: Divorced    Spouse name: Not on file  . Number of children: 0  . Years of education: college  . Highest education level: Not on file  Occupational History  . Not on file  Tobacco Use  . Smoking status: Never Smoker  . Smokeless tobacco: Never Used  Substance and Sexual Activity  . Alcohol use: No    Alcohol/week: 0.0 standard drinks    Comment: socially  . Drug use: No  . Sexual activity: Yes    Partners: Male    Birth control/protection: None    Comment: pt. given condoms 11/2019  Other Topics Concern  . 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     Drinks about 1 soda a day    Social Determinants of Health   Financial Resource Strain:   . Difficulty of Paying Living Expenses: Not on file  Food Insecurity:   . Worried About Charity fundraiser in the Last Year: Not on file  . Ran Out of Food in the Last Year: Not on file  Transportation Needs:   . Lack of Transportation (Medical): Not on file  . Lack of Transportation (Non-Medical): Not on file  Physical Activity:   . Days of Exercise per Week: Not on file  . Minutes of Exercise per Session: Not on file  Stress:   . Feeling of Stress : Not on file  Social Connections:   . Frequency of Communication with Friends and Family: Not on file  . Frequency of Social Gatherings with Friends and Family: Not on file  . Attends Religious Services: Not on file  . Active Member of Clubs or Organizations: Not on file  . Attends Archivist Meetings: Not on file  . Marital Status: Not on file    Her Allergies Are:  Allergies  Allergen Reactions  . Hydrocodone Itching and Nausea Only    Tolerates Oxycodone  . Tramadol Itching and Nausea Only    Tolerates oxycodone  :   Her Current Medications Are:   Outpatient Encounter Medications as of 01/31/2020  Medication Sig  . acetaminophen (TYLENOL) 500 MG tablet Take 1,000 mg by mouth every 6 (six) hours as needed for moderate pain.  . Darunavir-Cobicisctat-Emtricitabine-Tenofovir Alafenamide (SYMTUZA) 800-150-200-10 MG TABS Take 1 tablet by mouth daily with breakfast.  . hydrocortisone (ANUSOL-HC) 25 MG suppository Place 1 suppository (25 mg total) rectally 2 (two) times daily.  . hydrocortisone 1 % ointment Apply 1 application topically 2 (two) times daily.  . naproxen (NAPROSYN) 500 MG tablet Take 1 tablet (500 mg total) by mouth 2 (two) times daily with a meal.  . ondansetron (ZOFRAN)  8 MG tablet Take 1 tablet (8 mg total) by mouth every 6 (six) hours as needed for nausea.  . Pyrimethamine-Leucovorin 25-5 MG CAPS Take 25 mg by mouth daily.  . [DISCONTINUED] SUMAtriptan (IMITREX) 50 MG tablet Take 1 tablet (50 mg total) by mouth every 2 (two) hours as needed for migraine (Maximum dose: 100 mg per dose; 200 mg per 24 hours). May repeat in 2 hours if headache persists or recurs.   No facility-administered encounter medications on file as of 01/31/2020.  :  Review of Systems:  Out of a complete 14 point review of systems, all are reviewed and negative with the exception of these symptoms as listed below: Review of Systems  Neurological:       Here to discuss worsening dizziness and headaches. Pt reports dizziness is worse when she is in the car or when she stands from bending at the waist.     Objective:  Neurological Exam  Physical Exam Physical Examination:   Vitals:   01/31/20 0823  Temp: (!) 96.8 F (36 C)    General Examination: The patient is a very pleasant 29 y.o. female in no acute distress. She appears well-developed and well-nourished and well groomed. On orthostatic testing she has no significant orthostatic hypotension or symptoms.  Vital signs are recorded under the vital signs tab.  HEENT: Normocephalic, atraumatic,  pupils are equal, round and reactive to light and accommodation. Funduscopic exam is unremarkable. Extraocular tracking is good without limitation to gaze excursion or nystagmus noted. Normal smooth pursuit is noted. Hearing is grossly intact. Face is symmetric with normal facial animation and normal facial sensation. Speech is clear with no dysarthria noted. There is no hypophonia. There is no lip, neck/head, jaw or voice tremor. Neck is supple with full range of passive and active motion. There are no carotid bruits on auscultation. Oropharynx exam reveals: mild mouth dryness, good dental hygiene. Tongue protrudes centrally and palate elevates symmetrically.    Chest: Clear to auscultation without wheezing, rhonchi or crackles noted.  Heart: S1+S2+0, regular and normal without murmurs, rubs or gallops noted.   Abdomen: Soft, non-tender and non-distended with normal bowel sounds appreciated on auscultation.  Extremities: There is no pitting edema in the distal lower extremities bilaterally. Pedal pulses are intact. Puffy ankles noted, right more than left  Skin: Warm and dry without trophic changes noted.  Musculoskeletal: exam reveals Decreased range of motion right hip.   Neurologically:  Mental status: The patient is awake, alert and oriented in all 4 spheres. Her immediate and remote memory, attention, language skills and fund of knowledge are appropriate. There is no evidence of aphasia, agnosia, apraxia or anomia. Speech is clear with normal prosody and enunciation. Thought process is linear. Mood is normal and affect is normal.  Cranial nerves II - XII are as described above under HEENT exam. In addition: shoulder shrug is normal with equal shoulder height noted. Motor exam: Normal bulk, strength and tone is noted. There is no drift, tremor or rebound. Romberg is negative. Reflexes are 2+ throughout, except trace R knee. Fine motor skills and coordination: intact with normal finger  taps, normal hand movements, normal rapid alternating patting, normal foot taps and normal foot agility.  Cerebellar testing: No dysmetria or intention tremor on finger to nose testing. Heel to shin is unremarkable bilaterally, But she has decreased range of motion in the right hip. There is no truncal or gait ataxia.  Sensory exam: intact to light touch in the upper and  lower extremities.  Gait, station and balance: She stands easily. No veering to one side is noted. No leaning to one side is noted. Posture is age-appropriate and stance is narrow based. Gait shows normal stride length and normal pace. No problems turning are noted. Tandem walk is unremarkable.   Assessment and Plan:  In summary, Kelsey Rodriguez is a very pleasant 29 year old female with an underlying complex medical history of HIV, TB, aseptic meningitis for which she was in the hospital in October 2017, adrenal insufficiency, bilateral necrosis of the hip, s/p b/l TKAs, abnormal brain MRI and recent Hospitalization for severe headaches and CNS toxoplasmosis, who presents for re-consultation of her recurrent headaches. She feels improved Since her hospitalization in November 2020. Her neurological exam is nonfocal. We talked about headache triggers.  She is advised to stay well rested and well-hydrated with water. Of note, she had a sleep study in February 2018 which did not show any significant sleep disordered breathing.  She had symptomatic treatment in the past with amitriptyline for her headaches and had done well with it.  We mutually agreed to restart the amitriptyline, 25 mg strength half a pill with gradual titration. She has had some intermittent dizziness.  She is encouraged to talk to her primary care physician about seeing ENT.  She does not have any orthostatic hypotension or dizziness today. She is advised to follow-up with the nurse practitioner in about 3 months, sooner if needed.  I answered all her questions today and she was  in agreement. I answered all her questions today and she was in agreement. This was an extended visit.  I spent 40 minutes in total face-to-face time and in reviewing records during pre-charting, more than 50% of which was spent in counseling and coordination of care, reviewing test results, reviewing medications and treatment regimen and/or in discussing or reviewing the diagnosis of Recurrent headaches, CNS toxoplasmosis, the prognosis and treatment options. Pertinent laboratory and imaging test results that were available during this visit with the patient were reviewed by me and considered in my medical decision making (see chart for details).

## 2020-02-01 ENCOUNTER — Other Ambulatory Visit: Payer: Self-pay

## 2020-02-01 ENCOUNTER — Encounter: Payer: Self-pay | Admitting: Orthopaedic Surgery

## 2020-02-01 ENCOUNTER — Ambulatory Visit (INDEPENDENT_AMBULATORY_CARE_PROVIDER_SITE_OTHER): Payer: Medicaid Other | Admitting: Orthopaedic Surgery

## 2020-02-01 DIAGNOSIS — G8929 Other chronic pain: Secondary | ICD-10-CM | POA: Diagnosis not present

## 2020-02-01 DIAGNOSIS — M5441 Lumbago with sciatica, right side: Secondary | ICD-10-CM

## 2020-02-01 NOTE — Progress Notes (Signed)
Office Visit Note   Patient: Kelsey Rodriguez           Date of Birth: Apr 03, 1991           MRN: [id number] Visit Date: 02/01/2020              Requested by: Gerlene Fee, [address] [place],  [place] [address] PCP: Gerlene Fee, DO   Assessment & Plan: Visit Diagnoses:  1. Chronic right-sided low back pain with right-sided sciatica     Plan:  Given the patient's recent MRI of the lumbar spine which showed only mild L5-S1 degenerative disc disease without stenosis or impingement and her normal radiographs of the right hip we have nothing to offer from an orthopedic standpoint.  Recommend she follow-up with neurology for evaluation of the radicular symptoms down the right leg.  She may benefit from EMG nerve conduction studies to further evaluate this.  She was given form for handicap permit.  She will follow-up with [place] as needed.  Follow-Up Instructions: Return if symptoms worsen or fail to improve.   Orders:  No orders of the defined types were placed in this encounter.  No orders of the defined types were placed in this encounter.     Procedures: No procedures performed   Clinical Data: No additional findings.   Subjective: Chief Complaint  Patient presents with  . Lower Back - Pain  . Right Leg - Pain, Numbness    HPI Kelsey Rodriguez returns today due to continued numbness and tingling down her right leg to her ankle.  States this is worse with standing or walking for prolonged period of time however does occur at times whenever she is lying down.  Said no recent injury.  Right hip she has pain but no groin pain.  States her symptoms are unchanged.  MRI of her lumbar spine in November 2020 showed mild L5-S1 generative disc disease but no impingement or stenosis.  Radiographs of her right hip and pelvis show well-seated bilateral total hips.  No acute findings.  She has been seen at [facility] for headaches.  Review of Systems Positive  for low back pain radicular symptoms right leg.  Negative for fevers chills shortness of breath.  Objective: Vital Signs: LMP 01/05/2020   Physical Exam General: Well-developed well-nourished female no acute distress.  Mood affect appropriate. Psych: Alert and oriented x3.  Ortho Exam Walks with a slight antalgic gait.  Has no problems transitioning from seated position to standing.  Bilateral hips good range of motion.  Subjective discomfort with internal rotation right hip.  Nontender over the trochanteric region bilateral hips.  Tight hamstrings bilaterally straight leg raise is negative bilaterally.  Flexion lumbar spine has injected pain right lower lumbar paraspinous region.  Extension lumbar spine causes no pain.  Specialty Comments:  No specialty comments available.  Imaging: No results found.   PMFS History: Patient Active Problem List   Diagnosis Date Noted  . Encephalitis, myelitis, 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[place]) 01/31/2020  . Rotator cuff strain 01/26/2020  . 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 AIDS (acquired immune deficiency syndrome) ([place]) 11/07/2019  . Cerebral edema ([place]) 10/28/2019  . Midline shift of brain   . Pruritus 08/29/2019  . Tendinopathy of left shoulder 01/18/2019  . Chronic pelvic pain in female 01/04/2019  . Lower abdominal pain 06/21/2018  . Syphilis 02/26/2018  . Tuberculosis   . Infertility, female 02/12/2018  . 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  Acute right-sided low back pain with right-sided sciatica 08/24/2017  . Complex regional pain syndrome 02/03/2017  . Headache 10/28/2016  . Avascular necrosis of bone of right hip ([place]) 04/04/2016  . 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 Avascular necrosis of bone of left hip ([place]) 12/14/2015  . 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 Vertigo 01/23/2015  . Primary adrenal insufficiency ([place]) 01/03/2015  . HIV (human immunodeficiency virus  infection) ([place]) 03/16/2012  . 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 Adrenal insufficiency ([place])   . Anemia of chronic disease 03/11/2012  . Back pain of lumbar region with sciatica 02/12/2015  . Bell's palsy 08/26/2013  . 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 Chronic back pain   . Chronic leg pain    bilateral knees, ankles  . Fatigue   . 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 HIV (human immunodeficiency virus infection) ([place]) 02/2012  . Laceration of ankle, right 11/18/2012  . Lumbar radiculopathy   . Meningitis 02/18/2018  . Pelvic pain   . PID (acute pelvic inflammatory disease) 02/26/2018  . Reflux esophagitis 03/11/2012  . Tuberculosis   . 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 Vertigo     Family History  Problem Relation Age of Onset  . Heart disease Father        Vague not clearly cardiac    Past Surgical History:  Procedure Laterality Date  . APPENDECTOMY  ~ 2000  . DILATION AND CURETTAGE OF UTERUS  2008  . ESOPHAGOGASTRODUODENOSCOPY  03/11/2012   Procedure: ESOPHAGOGASTRODUODENOSCOPY (EGD);  Surgeon: Lafayette Dragon, MD;  Location: [facility] ENDOSCOPY;  Service: Endoscopy;  Laterality: N/A;  . ESOPHAGOGASTRODUODENOSCOPY N/A 03/07/2014   Procedure: ESOPHAGOGASTRODUODENOSCOPY (EGD);  Surgeon: Gatha Mayer, MD;  [facility] END[facility]COPY;  Service: Endoscopy;  Laterality: N/A;  . LUNG BIOPSY  02/2012  . TOTAL HIP ARTHROPLASTY Left 12/14/2015   Procedure: LEFT TOTAL HIP ARTHROPLASTY ANTERIOR APPROACH;  Surgeon: Mcarthur Rossetti, MD;  Location: [facility] ORS;  Service: Orthopedics;  Laterality: Left;  . TOTAL HIP ARTHROPLASTY Right 04/04/2016   Procedure: RIGHT TOTAL HIP ARTHROPLASTY ANTERIOR APPROACH;  Surgeon: Mcarthur Rossetti, MD;  Location: [facility] ORS;  Service: Orthopedics;  Laterality: Right;   Social History   Occupational History  . Not on file  Tobacco Use  . Smoking status: Never  Smoker  . Smokeless tobacco: Never Used  Substance and Sexual Activity  . Alcohol use: No    Alcohol/week: 0.0 standard drinks    Comment: socially  . Drug use: No  . Sexual activity: Yes    Partners: Male    Birth control/protection: None    Comment: pt. given condoms 11/2019

## 2020-02-02 ENCOUNTER — Encounter: Payer: Self-pay | Admitting: Family Medicine

## 2020-02-04 ENCOUNTER — Inpatient Hospital Stay (HOSPITAL_COMMUNITY)
Admission: EM | Admit: 2020-02-04 | Discharge: 2020-02-20 | DRG: 974 | Disposition: A | Discharge status: Home / Self Care | Payer: Medicaid Other | Attending: Family Medicine | Admitting: Family Medicine

## 2020-02-04 ENCOUNTER — Encounter (HOSPITAL_COMMUNITY): Payer: Self-pay | Admitting: Emergency Medicine

## 2020-02-04 ENCOUNTER — Emergency Department (HOSPITAL_COMMUNITY): Payer: Medicaid Other

## 2020-02-04 ENCOUNTER — Other Ambulatory Visit: Payer: Self-pay

## 2020-02-04 DIAGNOSIS — B582 Toxoplasma meningoencephalitis: Secondary | ICD-10-CM | POA: Diagnosis present

## 2020-02-04 DIAGNOSIS — M5416 Radiculopathy, lumbar region: Secondary | ICD-10-CM | POA: Diagnosis present

## 2020-02-04 DIAGNOSIS — R000 Tachycardia, unspecified: Secondary | ICD-10-CM | POA: Diagnosis not present

## 2020-02-04 DIAGNOSIS — Z91128 Patient's intentional underdosing of medication regimen for other reason: Secondary | ICD-10-CM

## 2020-02-04 DIAGNOSIS — R079 Chest pain, unspecified: Secondary | ICD-10-CM | POA: Diagnosis not present

## 2020-02-04 DIAGNOSIS — Z8661 Personal history of infections of the central nervous system: Secondary | ICD-10-CM

## 2020-02-04 DIAGNOSIS — D638 Anemia in other chronic diseases classified elsewhere: Secondary | ICD-10-CM | POA: Diagnosis present

## 2020-02-04 DIAGNOSIS — N179 Acute kidney failure, unspecified: Secondary | ICD-10-CM | POA: Diagnosis not present

## 2020-02-04 DIAGNOSIS — K2100 Gastro-esophageal reflux disease with esophagitis, without bleeding: Secondary | ICD-10-CM | POA: Diagnosis present

## 2020-02-04 DIAGNOSIS — Z20822 Contact with and (suspected) exposure to covid-19: Secondary | ICD-10-CM | POA: Diagnosis present

## 2020-02-04 DIAGNOSIS — B20 Human immunodeficiency virus [HIV] disease: Secondary | ICD-10-CM | POA: Diagnosis not present

## 2020-02-04 DIAGNOSIS — F329 Major depressive disorder, single episode, unspecified: Secondary | ICD-10-CM | POA: Diagnosis present

## 2020-02-04 DIAGNOSIS — Z791 Long term (current) use of non-steroidal anti-inflammatories (NSAID): Secondary | ICD-10-CM

## 2020-02-04 DIAGNOSIS — T450X6A Underdosing of antiallergic and antiemetic drugs, initial encounter: Secondary | ICD-10-CM | POA: Diagnosis present

## 2020-02-04 DIAGNOSIS — B5889 Toxoplasmosis with other organ involvement: Principal | ICD-10-CM | POA: Diagnosis present

## 2020-02-04 DIAGNOSIS — Y92009 Unspecified place in unspecified non-institutional (private) residence as the place of occurrence of the external cause: Secondary | ICD-10-CM

## 2020-02-04 DIAGNOSIS — R519 Headache, unspecified: Secondary | ICD-10-CM | POA: Diagnosis not present

## 2020-02-04 DIAGNOSIS — G8929 Other chronic pain: Secondary | ICD-10-CM | POA: Diagnosis present

## 2020-02-04 DIAGNOSIS — R45851 Suicidal ideations: Secondary | ICD-10-CM | POA: Diagnosis not present

## 2020-02-04 DIAGNOSIS — Z885 Allergy status to narcotic agent status: Secondary | ICD-10-CM

## 2020-02-04 DIAGNOSIS — G936 Cerebral edema: Secondary | ICD-10-CM | POA: Diagnosis present

## 2020-02-04 DIAGNOSIS — Z8611 Personal history of tuberculosis: Secondary | ICD-10-CM

## 2020-02-04 DIAGNOSIS — Z96643 Presence of artificial hip joint, bilateral: Secondary | ICD-10-CM | POA: Diagnosis present

## 2020-02-04 DIAGNOSIS — Z560 Unemployment, unspecified: Secondary | ICD-10-CM

## 2020-02-04 DIAGNOSIS — F322 Major depressive disorder, single episode, severe without psychotic features: Secondary | ICD-10-CM

## 2020-02-04 DIAGNOSIS — Z915 Personal history of self-harm: Secondary | ICD-10-CM

## 2020-02-04 DIAGNOSIS — R569 Unspecified convulsions: Secondary | ICD-10-CM | POA: Diagnosis not present

## 2020-02-04 DIAGNOSIS — Z8249 Family history of ischemic heart disease and other diseases of the circulatory system: Secondary | ICD-10-CM

## 2020-02-04 DIAGNOSIS — R110 Nausea: Secondary | ICD-10-CM

## 2020-02-04 DIAGNOSIS — B589 Toxoplasmosis, unspecified: Secondary | ICD-10-CM | POA: Diagnosis present

## 2020-02-04 DIAGNOSIS — R900 Intracranial space-occupying lesion found on diagnostic imaging of central nervous system: Secondary | ICD-10-CM

## 2020-02-04 DIAGNOSIS — Z79899 Other long term (current) drug therapy: Secondary | ICD-10-CM

## 2020-02-04 DIAGNOSIS — K5900 Constipation, unspecified: Secondary | ICD-10-CM | POA: Diagnosis not present

## 2020-02-04 DIAGNOSIS — R1010 Upper abdominal pain, unspecified: Secondary | ICD-10-CM

## 2020-02-04 DIAGNOSIS — R42 Dizziness and giddiness: Secondary | ICD-10-CM

## 2020-02-04 LAB — COMPREHENSIVE METABOLIC PANEL
ALT: 20 U/L (ref 0–44)
AST: 21 U/L (ref 15–41)
Albumin: 3.8 g/dL (ref 3.5–5.0)
Alkaline Phosphatase: 81 U/L (ref 38–126)
Anion gap: 10 (ref 5–15)
BUN: 14 mg/dL (ref 6–20)
CO2: 23 mmol/L (ref 22–32)
Calcium: 9.2 mg/dL (ref 8.9–10.3)
Chloride: 106 mmol/L (ref 98–111)
Creatinine, Ser: 0.82 mg/dL (ref 0.44–1.00)
GFR calc Af Amer: >60 mL/min (ref >60)
GFR calc non Af Amer: >60 mL/min (ref >60)
Glucose, Bld: 81 mg/dL (ref 70–99)
Potassium: 3.6 mmol/L (ref 3.5–5.1)
Sodium: 139 mmol/L (ref 135–145)
Total Bilirubin: 0.4 mg/dL (ref 0.3–1.2)
Total Protein: 8.3 g/dL — ABNORMAL HIGH (ref 6.5–8.1)

## 2020-02-04 LAB — CBC WITH DIFFERENTIAL/PLATELET
Abs Immature Granulocytes: 0.02 10*3/uL (ref 0.00–0.07)
Basophils Absolute: 0 10*3/uL (ref 0.0–0.1)
Basophils Relative: 1 %
Eosinophils Absolute: 0.3 10*3/uL (ref 0.0–0.5)
Eosinophils Relative: 10 %
HCT: 38 % (ref 36.0–46.0)
Hemoglobin: 12 g/dL (ref 12.0–15.0)
Immature Granulocytes: 1 %
Lymphocytes Relative: 35 %
Lymphs Abs: 1.2 10*3/uL (ref 0.7–4.0)
MCH: 31.1 pg (ref 26.0–34.0)
MCHC: 31.6 g/dL (ref 30.0–36.0)
MCV: 98.4 fL (ref 80.0–100.0)
Monocytes Absolute: 0.4 10*3/uL (ref 0.1–1.0)
Monocytes Relative: 13 %
Neutro Abs: 1.4 10*3/uL — ABNORMAL LOW (ref 1.7–7.7)
Neutrophils Relative %: 40 %
Platelets: 172 10*3/uL (ref 150–400)
RBC: 3.86 MIL/uL — ABNORMAL LOW (ref 3.87–5.11)
RDW: 13.9 % (ref 11.5–15.5)
WBC: 3.4 10*3/uL — ABNORMAL LOW (ref 4.0–10.5)
nRBC: 0 % (ref 0.0–0.2)

## 2020-02-04 LAB — URINALYSIS, ROUTINE W REFLEX MICROSCOPIC
Bilirubin Urine: NEGATIVE
Glucose, UA: NEGATIVE mg/dL
Ketones, ur: NEGATIVE mg/dL
Leukocytes,Ua: NEGATIVE
Nitrite: NEGATIVE
Protein, ur: NEGATIVE mg/dL
Specific Gravity, Urine: 1.025 (ref 1.005–1.030)
pH: 6 (ref 5.0–8.0)

## 2020-02-04 LAB — LACTIC ACID, PLASMA: Lactic Acid, Venous: 1.4 mmol/L (ref 0.5–1.9)

## 2020-02-04 LAB — SAVE SMEAR(SSMR), FOR PROVIDER SLIDE REVIEW

## 2020-02-04 LAB — POC SARS CORONAVIRUS 2 AG -  ED: SARS Coronavirus 2 Ag: NEGATIVE

## 2020-02-04 MED ORDER — LIDOCAINE 5 % EX PTCH
1.0000 | MEDICATED_PATCH | Freq: Every day | CUTANEOUS | Status: DC | PRN
  Administered 2020-02-13 – 2020-02-14 (×2): 1 via TRANSDERMAL
  Filled 2020-02-04 (×2): qty 1

## 2020-02-04 MED ORDER — GADOBUTROL 1 MMOL/ML IV SOLN
10.0000 mL | Freq: Once | INTRAVENOUS | Status: AC | PRN
  Administered 2020-02-04: 17:00:00 8.4 mL via INTRAVENOUS

## 2020-02-04 MED ORDER — SODIUM CHLORIDE 0.9 % IV SOLN: INTRAVENOUS | Status: DC

## 2020-02-04 MED ORDER — NAPROXEN 250 MG PO TABS
250.0000 mg | ORAL_TABLET | Freq: Three times a day (TID) | ORAL | Status: DC | PRN
  Administered 2020-02-05 – 2020-02-09 (×4): 250 mg via ORAL
  Filled 2020-02-04 (×6): qty 1

## 2020-02-04 MED ORDER — HYDROMORPHONE HCL 1 MG/ML IJ SOLN
1.0000 mg | Freq: Once | INTRAMUSCULAR | Status: AC
  Administered 2020-02-04: 15:00:00 1 mg via INTRAVENOUS
  Filled 2020-02-04: qty 1

## 2020-02-04 MED ORDER — AMITRIPTYLINE HCL 25 MG PO TABS: 25.0000 mg | ORAL_TABLET | Freq: Every day | ORAL | Status: DC

## 2020-02-04 MED ORDER — METOCLOPRAMIDE HCL 5 MG/ML IJ SOLN
10.0000 mg | Freq: Once | INTRAMUSCULAR | Status: AC
  Administered 2020-02-04: 18:00:00 10 mg via INTRAVENOUS
  Filled 2020-02-04: qty 2

## 2020-02-04 MED ORDER — HYDROCORTISONE ACETATE 25 MG RE SUPP
25.0000 mg | Freq: Two times a day (BID) | RECTAL | Status: DC | PRN
  Filled 2020-02-04: qty 1

## 2020-02-04 MED ORDER — SODIUM CHLORIDE 0.9 % IV BOLUS
1000.0000 mL | Freq: Once | INTRAVENOUS | Status: AC
  Administered 2020-02-04: 15:00:00 1000 mL via INTRAVENOUS

## 2020-02-04 MED ORDER — DARUN-COBIC-EMTRICIT-TENOFAF 800-150-200-10 MG PO TABS
1.0000 | ORAL_TABLET | Freq: Every day | ORAL | Status: DC
  Administered 2020-02-05 – 2020-02-06 (×2): 1 via ORAL
  Filled 2020-02-04 (×2): qty 1

## 2020-02-04 MED ORDER — ONDANSETRON HCL 4 MG PO TABS: 4.0000 mg | ORAL_TABLET | Freq: Three times a day (TID) | ORAL | Status: DC | PRN

## 2020-02-04 MED ORDER — AMITRIPTYLINE HCL 50 MG PO TABS
25.0000 mg | ORAL_TABLET | Freq: Every day | ORAL | Status: DC
  Administered 2020-02-04 – 2020-02-13 (×10): 25 mg via ORAL
  Filled 2020-02-04 (×10): qty 1

## 2020-02-04 MED ORDER — HYDROCORTISONE 1 % EX OINT
1.0000 "application " | TOPICAL_OINTMENT | Freq: Two times a day (BID) | CUTANEOUS | Status: DC | PRN
  Filled 2020-02-04: qty 28

## 2020-02-04 MED ORDER — DIPHENHYDRAMINE HCL 50 MG/ML IJ SOLN
25.0000 mg | Freq: Once | INTRAMUSCULAR | Status: AC
  Administered 2020-02-04: 18:00:00 25 mg via INTRAVENOUS
  Filled 2020-02-04: qty 1

## 2020-02-04 MED ORDER — ACETAMINOPHEN 325 MG PO TABS
650.0000 mg | ORAL_TABLET | Freq: Four times a day (QID) | ORAL | Status: DC | PRN
  Administered 2020-02-04 – 2020-02-13 (×2): 650 mg via ORAL
  Filled 2020-02-04 (×3): qty 2

## 2020-02-04 MED ORDER — ENOXAPARIN SODIUM 40 MG/0.4ML ~~LOC~~ SOLN
40.0000 mg | SUBCUTANEOUS | Status: DC
  Administered 2020-02-04 – 2020-02-05 (×2): 40 mg via SUBCUTANEOUS
  Filled 2020-02-04 (×2): qty 0.4

## 2020-02-04 NOTE — ED Notes (Signed)
Dinner Tray Ordered @ [id number].

## 2020-02-04 NOTE — ED Notes (Signed)
Urine culture sent as held tube with UA

## 2020-02-04 NOTE — ED Triage Notes (Signed)
Pt. Stated, Ive had headache, and nausea since yesterday.

## 2020-02-04 NOTE — H&P (Addendum)
[facility] Admission History and Physical Service Pager: [phone]  Patient name: Kelsey Rodriguez Medical record number: [id number] Date of birth: 11/13/1991 Age: 29 y.o. Gender: female  Primary Care Provider: Gerlene Fee, DO Consultants: ID Code Status:   Code Status: Prior   Chief Complaint: HA  Assessment and Plan: Letetia Bih Jolayne Rodriguez is a 29 y.o. female presenting with AoC HA w/ N/V . 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   #Acute on chronic headache likely secondary to prior CNS lesion without mass effect Patient with chronic headache syndrome likely secondary to brain mass in the setting of poorly controlled HIV/AIDS.  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  MRI shows mild reduction in surrounding inflammation, stable mass with ring-enhancing lesion.  Cannot rule out lymphoma.  Although patient is without B-cell symptoms or stigmata weight loss.  Patient endorsed compliance with HIV medications but did miss recent ID appointment in December.  Was previously treated with sulfadiazine but did not tolerate, was transitioned to clindamycin for toxoplasmosis.  Overall appears compliant with remainder of physicians including neurology, orthopedics, family medicine.  ID plans to consult in the morning.  Headache improved after migraine cocktail in ED.  Has mild headache returning, does not endorse nausea at this time.  No neurologic deficits.  Treats headache at home with Tylenol, naproxen, amitriptyline  Admit to FPTS, MedSurg  ID consulted and ED, appreciate recommendations  in the a.m.  Amitriptyline 25 mg  Tylenol, naproxen as needed  If not felt to be toxoplasmosis, consider consulting neurosurgery for specimen biopsy  HIV  AIDS Absolute lymphocytes count 1190.  CD4 count was 36 on 11/2019.  Currently on ART.  Endorses compliance.  No opportunistic infection prophylaxis such as PCP, histoplasmosis, toxoplasmosis, cryptococcus, MAC.  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   ID recommendations appreciated  A.m. CD4 count and HIV viral load  Continue home ART  Chronic MSK pain History of bilateral AVN, recent rotator cuff pain, lumbar radiculopathy.  Recently on high NSAIDs for shoulder pain.  Recent orthopedics appointment recommended follow-up with neurology for EMG for sciatic issues.   Multimodality pain regimen including Tylenol, naproxen, lidocaine, K pad as needed  PT evaluation  FEN/GI: Regular diet Prophylaxis: lovenox  Disposition:   History of Present Illness:  Kelsey Rodriguez is a 29 y.o. female presenting with acute on chronic headache.  Headache was worse yesterday associated with vomiting.  Present to the ED for uncontrolled pain did not resolve with Tylenol or ibuprofen or Zofran at home.  In the ED she received a migraine cocktail and headache significantly improved.  MRI showed stable ring-enhancing lesion on right temporal lobe without change or midline shift.  Case was discussed with neurology and infectious disease.  Neurology with no urgent follow-up needed.  ID to follow in the a.m.  FM consulted for admission.  Upon examination, patient is tolerating diet well without any neurologic deficits.  Review Of Systems: Per HPI with the following additions:  Review of Systems  Constitutional: Negative for chills, fever and weight loss.  HENT: Negative.   Eyes: Negative.  Negative for blurred vision and double vision.  Respiratory: Negative.  Negative for cough.   Cardiovascular: Negative.    Gastrointestinal: Positive for nausea and vomiting.  Genitourinary: Negative.   Musculoskeletal: Positive for back pain (chronic back pain).  Skin: Negative.   Neurological: Positive for dizziness (chronic) and headaches (chronic). Negative for weakness.  Endo/Heme/Allergies: Negative.   Psychiatric/Behavioral: Negative.     Patient Active Problem List   Diagnosis Date Noted  . Intractable headache 02/04/2020  . Encephalitis, myelitis, 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[place]) 01/31/2020  . Rotator cuff strain 01/26/2020  . 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 AIDS (acquired immune deficiency syndrome) ([place]) 11/07/2019  . Cerebral edema ([place]) 10/28/2019  . Midline shift of brain   . Pruritus 08/29/2019  . Tendinopathy of left shoulder 01/18/2019  . Chronic pelvic pain in female 01/04/2019  . Lower abdominal pain 06/21/2018  . Syphilis 02/26/2018  . Tuberculosis   . Infertility, female 02/12/2018  . 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 Acute right-sided low back pain with right-sided sciatica 08/24/2017  . Complex regional pain syndrome 02/03/2017  . Headache 10/28/2016  . Avascular necrosis of bone of right hip ([place]) 04/04/2016  . 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 Avascular necrosis of bone of left hip ([place]) 12/14/2015  . 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 Vertigo 01/23/2015  . Primary adrenal insufficiency ([place]) 01/03/2015  . HIV (human immunodeficiency virus infection) ([place]) 03/16/2012  . 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 Adrenal insufficiency ([place])   . Anemia of chronic disease 03/11/2012  . Back pain of lumbar region with sciatica 02/12/2015  . Bell's palsy 08/26/2013  . 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 Chronic back pain   . Chronic leg pain    bilateral knees, ankles  . Fatigue   . 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 HIV (human immunodeficiency virus infection) ([place]) 02/2012  . Laceration of ankle, right 11/18/2012  . Lumbar radiculopathy   . Meningitis 02/18/2018  . Pelvic pain   . PID (acute pelvic inflammatory disease) 02/26/2018  . Reflux esophagitis 03/11/2012  . Tuberculosis   . 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 Vertigo     Past Surgical History: Past Surgical History:  Procedure Laterality Date  . APPENDECTOMY  ~ 2000  . DILATION AND CURETTAGE OF UTERUS  2008  . ESOPHAGOGASTRODUODENOSCOPY  03/11/2012   Procedure: ESOPHAGOGASTRODUODENOSCOPY (EGD);  Surgeon: Lafayette Dragon, MD;  Location: [facility] ENDOSCOPY;  Service: Endoscopy;  Laterality: N/A;  . ESOPHAGOGASTRODUODENOSCOPY N/A 03/07/2014   Procedure: ESOPHAGOGASTRODUODENOSCOPY (EGD);  Surgeon: Gatha Mayer, MD;  Location: [facility] ENDOSCOPY;  Service: Endoscopy;  Laterality: N/A;  . LUNG BIOPSY  02/2012  . TOTAL HIP ARTHROPLASTY Left 12/14/2015   Procedure: LEFT TOTAL HIP ARTHROPLASTY ANTERIOR APPROACH;  Surgeon: Mcarthur Rossetti, MD;  Location: [facility] ORS;  Service: Orthopedics;  Laterality: Left;  . TOTAL HIP ARTHROPLASTY Right 04/04/2016   Procedure: RIGHT TOTAL HIP ARTHROPLASTY ANTERIOR APPROACH;  Surgeon: Mcarthur Rossetti, MD;  Location: [facility] ORS;  Service: Orthopedics;  Laterality: Right;    Social History: Social History   Tobacco Use  . Smoking status: Never Smoker  . Smokeless tobacco: Never Used  Substance Use Topics  . Alcohol use: No    Alcohol/week: 0.0 standard drinks    Comment: socially  . Drug use: No   Additional social history:   Family History: Family History  Problem Relation Age of Onset  . Heart disease Father        Vague not clearly cardiac   Allergies and Medications: Allergies  Allergen Reactions  . Hydrocodone Itching and Nausea Only    Tolerates Oxycodone  . Tramadol Itching and Nausea Only    Tolerates oxycodone   No current facility-administered  medications on file prior to encounter.   Current Outpatient Medications on File Prior to Encounter  Medication Sig Dispense Refill  . acetaminophen (TYLENOL) 500 MG tablet Take 1,000 mg by mouth every 6 (six) hours as needed for moderate pain.    [name] amitriptyline (ELAVIL) 25 MG tablet 1/2 pill each bedtime x 1 week, then 1 pill nightly x 1 week, then 1 1/2 pills nightly x 1 week, then 2 pills nightly thereafter. 60 tablet 5  . Darunavir-Cobicisctat-Emtricitabine-Tenofovir Alafenamide (SYMTUZA) 800-150-200-10 MG TABS Take 1 tablet by mouth daily with breakfast. 30 tablet 2  . hydrocortisone (ANUSOL-HC) 25 MG suppository Place 1 suppository (25 mg total) rectally 2 (two) times daily. 12 suppository 0  . hydrocortisone 1 % ointment Apply 1 application topically 2 (two) times daily. 30 g 0  . naproxen (NAPROSYN) 500 MG tablet Take 1 tablet (500 mg total) by mouth 2 (two) times daily with a meal. 30 tablet 0  . ondansetron (ZOFRAN) 8 MG tablet Take 1 tablet (8 mg total) by mouth every 6 (six) hours as needed for nausea. 30 tablet 0  . Pyrimethamine-Leucovorin 25-5 MG CAPS Take 25 mg by mouth daily. 42 capsule 0  . [DISCONTINUED] SUMAtriptan (IMITREX) 50 MG tablet Take 1 tablet (50 mg total) by mouth every 2 (two) hours as needed for migraine (Maximum dose: 100 mg per dose; 200 mg per 24 hours). May repeat in 2 hours if headache persists or recurs. 10 tablet 0    Objective: BP (!) 108/97   Pulse 78   Temp 98.4 F (36.9 C) (Oral)   Resp 18   Ht _0  (1.651 m)   Wt 84.4 kg   LMP 02/01/2020 (Exact Date)   SpO2 100%   BMI 30.96 kg/m  Exam: Gen: NAD, resting comfortably, sitting in bed eating dinner CV: RRR with no murmurs appreciated Pulm: NWOB, CTAB with no crackles, wheezes, or rhonchi GI: Normal bowel sounds present. Soft, Nontender, Nondistended. MSK: no edema, cyanosis, or clubbing noted, 5 of 5 strength in upper and lower extremities bilaterally Lymph nodes: no cervical,  supraclavicular, or inguinal lymphadenopathy Skin: warm, dry Neuro:moves all extremities,A&Ox3, PERRLA, EOMI, cranial nerves II through XII grossly intact Psych: Normal affect and thought content   Labs and Imaging: CBC BMET  Recent Labs  Lab 02/04/20 1511  WBC 3.4*  HGB 12.0  HCT 38.0  PLT 172   Recent Labs  Lab 02/04/20 1511  NA 139  K 3.6  CL 106  CO2 23  BUN 14  CREATININE 0.82  GLUCOSE 81  CALCIUM 9.2       Bonnita Hollow, MD 02/04/2020, 8:04 PM PGY-3, [place] Intern pager: [phone], text pages welcome

## 2020-02-04 NOTE — ED Notes (Signed)
Pt states that she still has been "missing some doses" of her antiviral medication for HIV, asked pt if she would say that she is taking more or less regularly than 10/2019 admission, responds "about the same as then".

## 2020-02-04 NOTE — ED Provider Notes (Addendum)
[facility] [place] EMERGENCY DEPARTMENT Provider Note   CSN: [id number] Arrival date & time: 02/04/20  1422     History Chief Complaint  Patient presents with  . Headache  . Nausea    Kelsey Rodriguez is a 29 y.o. female.  Patient with complicated history.  Followed by [facility] neurology followed by infectious disease and followed by [facility].  Patient with known history of HIV.  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  Treated for that and also was known to be noncompliant with her antivirals.  She went through an induction while in the hospital.  Infectious disease last saw her the beginning of December they were supposed to see her at the end of December.  Her viral load was high and November.  She was due to have a repeat one the end of December but did not follow-up.  Patient seen by [facility] the last time on February 11 and seen by [facility] neurology the last time on February 16.  Starting on February 18 of the chronic headaches which are usually bilateral frontal moving to the top of her head and they are there most days but not every day got significantly worse and associated with nausea and vomiting and patient felt as if she had a fever yesterday.  When patient was admitted in November for the concern for the brain lesion being toxo plus most this.  Neurosurgery was consulted at that time.  Patient has not had a biopsy.        Past Medical History:  Diagnosis Date  . 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 Adrenal insufficiency ([place])   . Anemia of chronic disease 03/11/2012  . Back pain of lumbar region with sciatica 02/12/2015  . Bell's palsy 08/26/2013  . 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 Chronic back pain   . Chronic leg pain    bilateral knees, ankles  . Fatigue   . 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 HIV (human immunodeficiency virus infection) ([place]) 02/2012  . Laceration of ankle, right  11/18/2012  . Lumbar radiculopathy   . Meningitis 02/18/2018  . Pelvic pain   . PID (acute pelvic inflammatory disease) 02/26/2018  . Reflux esophagitis 03/11/2012  . Tuberculosis   . 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 Vertigo     Patient Active Problem List   Diagnosis Date Noted  . Encephalitis, myelitis, 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[place]) 01/31/2020  . Rotator cuff strain 01/26/2020  . 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 AIDS (acquired immune deficiency syndrome) ([place]) 11/07/2019  . Cerebral edema ([place]) 10/28/2019  . Midline shift of brain   . Pruritus 08/29/2019  . Tendinopathy of left shoulder 01/18/2019  . Chronic pelvic pain in female 01/04/2019  . Lower abdominal pain 06/21/2018  . Syphilis 02/26/2018  . Tuberculosis   . Infertility, female 02/12/2018  . 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 Acute right-sided low back pain with right-sided sciatica 08/24/2017  . Complex regional pain syndrome 02/03/2017  . Headache 10/28/2016  . Avascular necrosis of bone of right hip ([place]) 04/04/2016  . 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 Avascular necrosis of bone of left hip ([place]) 12/14/2015  . 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 Vertigo 01/23/2015  . Primary adrenal insufficiency ([place]) 01/03/2015  . HIV (human immunodeficiency virus infection) ([place]) 03/16/2012  . 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 APPENDECTOMY  ~ 2000  . DILATION AND CURETTAGE OF UTERUS  2008  . ESOPHAGOGASTRODUODENOSCOPY  03/11/2012   Procedure: ESOPHAGOGASTRODUODENOSCOPY (EGD);  Surgeon: Lafayette Dragon, MD;  Location[facility] ENDOSCOPY;  Service: Endoscopy;  Laterality: N/A;  . ESOPHAGOGASTRODUODENOSCOPY N/A 03/07/2014   Procedure: ESOPHAGOGASTRODUODENOSCOPY (EGD);  Surgeon: Gatha Mayer, MD;  Location: [facility] ENDOSCOPY;  Service: Endoscopy;  Laterality: N/A;  . LUNG BIOPSY   02/2012  . TOTAL HIP ARTHROPLASTY Left 12/14/2015   Procedure: LEFT TOTAL HIP ARTHROPLASTY ANTERIOR APPROACH;  Surgeon: Mcarthur Rossetti, MD;  Location: [facility] ORS;  Service: Orthopedics;  Laterality: Left;  . TOTAL HIP ARTHROPLASTY Right 04/04/2016   Procedure: RIGHT TOTAL HIP ARTHROPLASTY ANTERIOR APPROACH;  Surgeon: Mcarthur Rossetti, MD;  Location: [facility] ORS;  Service: Orthopedics;  Laterality: Right;     OB History    Gravida  1   Para  0   Term  0   Preterm  0   AB  1   Living  0     SAB  1   TAB  0   Ectopic  0   Multiple  0   Live Births              Family History  Problem Relation Age of Onset  . Heart disease Father        Vague not clearly cardiac    Social History   Tobacco Use  . Smoking status: Never Smoker  . Smokeless tobacco: Never Used  Substance Use Topics  . Alcohol use: No    Alcohol/week: 0.0 standard drinks    Comment: socially  . Drug use: No    Home Medications Prior to Admission medications   Medication Sig Start Date End Date Taking? Authorizing Provider  acetaminophen (TYLENOL) 500 MG tablet Take 1,000 mg by mouth every 6 (six) hours as needed for moderate pain.    [provider]  amitriptyline (ELAVIL) 25 MG tablet 1/2 pill each bedtime x 1 week, then 1 pill nightly x 1 week, then 1 1/2 pills nightly x 1 week, then 2 pills nightly thereafter. 01/31/20   Star Age, MD  Darunavir-Cobicisctat-Emtricitabine-Tenofovir Alafenamide [facility]) 800-150-200-10 MG TABS Take 1 tablet by mouth daily with breakfast. 11/18/19   Golden Circle, FNP  hydrocortisone (ANUSOL-HC) 25 MG suppository Place 1 suppository (25 mg total) rectally 2 (two) times daily. 12/04/19   Tacy Learn, PA-C  hydrocortisone 1 % ointment Apply 1 application topically 2 (two) times daily. 11/22/19   Rory Percy, DO  naproxen (NAPROSYN) 500 MG tablet Take 1 tablet (500 mg total) by mouth 2 (two) times daily with a meal. 01/26/20   Matilde Haymaker, MD   ondansetron (ZOFRAN) 8 MG tablet Take 1 tablet (8 mg total) by mouth every 6 (six) hours as needed for nausea. 11/09/19   Guadalupe [name], MD  Pyrimethamine-Leucovorin 25-5 MG CAPS Take 25 mg by mouth daily. 11/08/19   Guadalupe [name], MD  SUMAtriptan (IMITREX) 50 MG tablet Take 1 tablet (50 mg total) by mouth every 2 (two) hours as needed for migraine (Maximum dose: 100 mg per dose; 200 mg per 24 hours). May repeat in 2 hours if headache persists or recurs. 10/26/19 01/31/20  Caroline More, DO    Allergies    Hydrocodone and Tramadol  Review of Systems   Review of Systems  Constitutional: Positive for fever. Negative for chills.  HENT: Negative for congestion, rhinorrhea and sore throat.  Eyes: Negative for visual disturbance.  Respiratory: Negative for cough and shortness of breath.   Cardiovascular: Negative for chest pain and leg swelling.  Gastrointestinal: Positive for nausea and vomiting. Negative for abdominal pain and diarrhea.  Genitourinary: Negative for dysuria.  Musculoskeletal: Positive for neck pain. Negative for back pain.  Skin: Negative for rash.  Neurological: Positive for headaches. Negative for dizziness and light-headedness.  Hematological: Does not bruise/bleed easily.  Psychiatric/Behavioral: Negative for confusion.    Physical Exam Updated Vital Signs BP 129/89   Pulse 88   Temp 98.7 F (37.1 C) (Oral)   Resp 18   Ht 1.651 m (5\' 5" )   Wt 84.4 kg   LMP 02/01/2020 (Exact Date)   SpO2 100%   BMI 30.95 kg/m   Physical Exam Vitals and nursing note reviewed.  Constitutional:      General: She is not in acute distress.    Appearance: Normal appearance. She is well-developed.  HENT:     Head: Normocephalic and atraumatic.  Eyes:     Extraocular Movements: Extraocular movements intact.     Conjunctiva/sclera: Conjunctivae normal.     Pupils: Pupils are equal, round, and reactive to light.  Cardiovascular:     Rate and Rhythm: Normal rate and  regular rhythm.     Heart sounds: No murmur.  Pulmonary:     Effort: Pulmonary effort is normal. No respiratory distress.     Breath sounds: Normal breath sounds.  Abdominal:     Palpations: Abdomen is soft.     Tenderness: There is no abdominal tenderness.  Musculoskeletal:        General: No swelling. Normal range of motion.     Cervical back: Neck supple. No rigidity.  Skin:    General: Skin is warm and dry.  Neurological:     General: No focal deficit present.     Mental Status: She is alert and oriented to person, place, and time.     Cranial Nerves: No cranial nerve deficit.     Sensory: No sensory deficit.     Motor: No weakness.     ED Results / Procedures / Treatments   Labs (all labs ordered are listed, but only abnormal results are displayed) Labs Reviewed  CBC WITH DIFFERENTIAL/PLATELET - Abnormal; Notable for the following components:      Result Value   WBC 3.4 (*)    RBC 3.86 (*)    Neutro Abs 1.4 (*)    All other components within normal limits  COMPREHENSIVE METABOLIC PANEL - Abnormal; Notable for the following components:   Total Protein 8.3 (*)    All other components within normal limits  URINALYSIS, ROUTINE W REFLEX MICROSCOPIC - Abnormal; Notable for the following components:   Hgb urine dipstick MODERATE (*)    Bacteria, UA RARE (*)    All other components within normal limits  CULTURE, BLOOD (ROUTINE X 2)  CULTURE, BLOOD (ROUTINE X 2)  LACTIC ACID, PLASMA  I-STAT BETA HCG BLOOD, ED ([facility], [facility], [facility] ONLY)  POC SARS CORONAVIRUS 2 AG -  ED    EKG None  Radiology MR BRAIN W WO CONTRAST  Result Date: 02/04/2020 CLINICAL DATA:  29 year old female with HIV. History of toxoplasmosis infection in November. Headache and nausea since yesterday. EXAM: MRI HEAD WITHOUT AND WITH CONTRAST TECHNIQUE: Multiplanar, multiecho pulse sequences of the brain and surrounding structures were obtained without and with intravenous contrast. CONTRAST:  8.33mL GADAVIST  GADOBUTROL 1 MMOL/ML IV SOLN COMPARISON:  Brain MRI  10/28/2019 and earlier. FINDINGS: Brain: Since November a round and irregular rim enhancing masslike area in the right lateral temporal lobe, involving both the superior and middle temporal gyri has not regressed. The enhancing component is stable to minimally larger since November and has a slightly different shape now, encompassing 26 by 20 x 25 mm (AP by transverse by CC) vs previously 25 x 19 x 24 mm. The core lesion is dark on T2 with mild restricted diffusion. The relatively stable size since November would argue against a high-grade glioma. Confluent surrounding T2 and FLAIR hyperintensity in a pattern suggesting vasogenic edema has only mildly regressed. Mild regional mass effect is stable. There is no midline shift. There is minimal overlying dural thickening as seen in November, but no other enhancing brain lesion. No superimposed restricted diffusion to suggest acute infarction. No ventriculomegaly, extra-axial collection or acute intracranial hemorrhage. Pearline Cables and white matter signal outside of the involved right hemisphere remains within normal limits. Cervicomedullary junction and pituitary are within normal limits. Vascular: Major intracranial vascular flow voids are stable. The major dural venous sinuses are enhancing and appear to be patent. Skull and upper cervical spine: Negative visible cervical spine and spinal cord. Normal bone marrow signal. Sinuses/Orbits: Stable and negative. Other: Mastoids remain clear. Visible internal auditory structures appear normal. IMPRESSION: 1. Continued enhancing right temporal lobe mass, 2.6 cm now and either not regressed or recurrent since the November MRI. Mildly less regional vasogenic edema than before. No midline shift or intracranial mass effect now. If the diagnosis of CNS toxoplasmosis is in doubt then another top differential consideration would be CNS Lymphoma. 2. No 2nd lesion or new intracranial  abnormality identified. Electronically Signed   By: Genevie Ann M.D.   On: 02/04/2020 16:52    Procedures Procedures (including critical care time)  Medications Ordered in ED Medications  0.9 %  sodium chloride infusion (has no administration in time range)  sodium chloride 0.9 % bolus 1,000 mL (0 mLs Intravenous Stopped 02/04/20 1640)  HYDROmorphone (DILAUDID) injection 1 mg (1 mg Intravenous Given 02/04/20 1515)  gadobutrol (GADAVIST) 1 MMOL/ML injection 10 mL (8.4 mLs Intravenous Contrast Given 02/04/20 1637)  metoCLOPramide (REGLAN) injection 10 mg (10 mg Intravenous Given 02/04/20 1741)  diphenhydrAMINE (BENADRYL) injection 25 mg (25 mg Intravenous Given 02/04/20 1740)    ED Course  I have reviewed the triage vital signs and the nursing notes.  Pertinent labs & imaging results that were available during my care of the patient were reviewed by me and considered in my medical decision making (see chart for details).    MDM Rules/Calculators/A&P                     Patient's work-up here today not febrile on presentation here.  Patient received 1 mg of hydromorphone as well as Reglan and Benadryl for the headache and 1 L of fluid.  MRI was reordered because of the findings back in November.  MRI brain today shows persistent lesion in the same place may be slightly smaller less inflammatory changes around the.  Discussed with Dr. Leonel Ramsay from neurology who felt that there was not anything acute for neurology do at this time.  Recommended that this succussion with infectious disease.  Discussed with on-call infectious disease they will follow her in consultation and are recommending admission.  Their concern is her noncompliance.  Her lymphocyte counts are fine today but her viral load is not known.  And she has this persistent lesion  not clear whether she was compliant on her medications for that as well.  She has a history of not being compliant with her antivirals.  Patient states she has been  taking her antivirals however.  Patient followed by [facility] family medicine will contact them for admission and infectious disease will consult.  Also MRI did raise some concern about the lesion may be being lymphoma and not being toxoplasmosis.  Discussed with infectious disease as well as neurology.  Also discussed with [facility] and they will admit and infectious disease will consult.  Final Clinical Impression(s) / ED Diagnoses Final diagnoses:  Chronic intractable headache, unspecified headache type  Symptomatic HIV infection [facility])    Rx / DC Orders ED Discharge Orders    None       Fredia Sorrow, MD 02/04/20 Hazle Nordmann    Fredia Sorrow, MD 02/04/20 [phone]

## 2020-02-05 ENCOUNTER — Encounter (HOSPITAL_COMMUNITY): Payer: Self-pay

## 2020-02-05 DIAGNOSIS — Z791 Long term (current) use of non-steroidal anti-inflammatories (NSAID): Secondary | ICD-10-CM | POA: Diagnosis not present

## 2020-02-05 DIAGNOSIS — R000 Tachycardia, unspecified: Secondary | ICD-10-CM | POA: Diagnosis not present

## 2020-02-05 DIAGNOSIS — B20 Human immunodeficiency virus [HIV] disease: Secondary | ICD-10-CM | POA: Diagnosis not present

## 2020-02-05 DIAGNOSIS — R110 Nausea: Secondary | ICD-10-CM | POA: Diagnosis not present

## 2020-02-05 DIAGNOSIS — R569 Unspecified convulsions: Secondary | ICD-10-CM | POA: Diagnosis not present

## 2020-02-05 DIAGNOSIS — F329 Major depressive disorder, single episode, unspecified: Secondary | ICD-10-CM

## 2020-02-05 DIAGNOSIS — R079 Chest pain, unspecified: Secondary | ICD-10-CM | POA: Diagnosis not present

## 2020-02-05 DIAGNOSIS — B582 Toxoplasma meningoencephalitis: Secondary | ICD-10-CM | POA: Diagnosis not present

## 2020-02-05 DIAGNOSIS — H9192 Unspecified hearing loss, left ear: Secondary | ICD-10-CM | POA: Diagnosis not present

## 2020-02-05 DIAGNOSIS — B5889 Toxoplasmosis with other organ involvement: Secondary | ICD-10-CM | POA: Diagnosis not present

## 2020-02-05 DIAGNOSIS — Z91128 Patient's intentional underdosing of medication regimen for other reason: Secondary | ICD-10-CM | POA: Diagnosis not present

## 2020-02-05 DIAGNOSIS — K5900 Constipation, unspecified: Secondary | ICD-10-CM | POA: Diagnosis not present

## 2020-02-05 DIAGNOSIS — M5416 Radiculopathy, lumbar region: Secondary | ICD-10-CM | POA: Diagnosis not present

## 2020-02-05 DIAGNOSIS — D638 Anemia in other chronic diseases classified elsewhere: Secondary | ICD-10-CM | POA: Diagnosis not present

## 2020-02-05 DIAGNOSIS — Z885 Allergy status to narcotic agent status: Secondary | ICD-10-CM

## 2020-02-05 DIAGNOSIS — T450X6A Underdosing of antiallergic and antiemetic drugs, initial encounter: Secondary | ICD-10-CM | POA: Diagnosis not present

## 2020-02-05 DIAGNOSIS — R519 Headache, unspecified: Secondary | ICD-10-CM | POA: Diagnosis not present

## 2020-02-05 DIAGNOSIS — R900 Intracranial space-occupying lesion found on diagnostic imaging of central nervous system: Secondary | ICD-10-CM

## 2020-02-05 DIAGNOSIS — R4182 Altered mental status, unspecified: Secondary | ICD-10-CM | POA: Diagnosis not present

## 2020-02-05 DIAGNOSIS — Z79899 Other long term (current) drug therapy: Secondary | ICD-10-CM | POA: Diagnosis not present

## 2020-02-05 DIAGNOSIS — Y92009 Unspecified place in unspecified non-institutional (private) residence as the place of occurrence of the external cause: Secondary | ICD-10-CM | POA: Diagnosis not present

## 2020-02-05 DIAGNOSIS — B589 Toxoplasmosis, unspecified: Secondary | ICD-10-CM | POA: Diagnosis not present

## 2020-02-05 DIAGNOSIS — G9389 Other specified disorders of brain: Secondary | ICD-10-CM | POA: Diagnosis not present

## 2020-02-05 DIAGNOSIS — Z9114 Patient's other noncompliance with medication regimen: Secondary | ICD-10-CM | POA: Diagnosis not present

## 2020-02-05 DIAGNOSIS — G8929 Other chronic pain: Secondary | ICD-10-CM | POA: Diagnosis not present

## 2020-02-05 DIAGNOSIS — R45851 Suicidal ideations: Secondary | ICD-10-CM | POA: Diagnosis not present

## 2020-02-05 DIAGNOSIS — G936 Cerebral edema: Secondary | ICD-10-CM | POA: Diagnosis not present

## 2020-02-05 DIAGNOSIS — Z8661 Personal history of infections of the central nervous system: Secondary | ICD-10-CM | POA: Diagnosis not present

## 2020-02-05 DIAGNOSIS — Z96643 Presence of artificial hip joint, bilateral: Secondary | ICD-10-CM | POA: Diagnosis not present

## 2020-02-05 DIAGNOSIS — G40409 Other generalized epilepsy and epileptic syndromes, not intractable, without status epilepticus: Secondary | ICD-10-CM | POA: Diagnosis not present

## 2020-02-05 DIAGNOSIS — Z560 Unemployment, unspecified: Secondary | ICD-10-CM | POA: Diagnosis not present

## 2020-02-05 DIAGNOSIS — K2100 Gastro-esophageal reflux disease with esophagitis, without bleeding: Secondary | ICD-10-CM | POA: Diagnosis not present

## 2020-02-05 DIAGNOSIS — N179 Acute kidney failure, unspecified: Secondary | ICD-10-CM | POA: Diagnosis not present

## 2020-02-05 DIAGNOSIS — Z8611 Personal history of tuberculosis: Secondary | ICD-10-CM | POA: Diagnosis not present

## 2020-02-05 DIAGNOSIS — Z20822 Contact with and (suspected) exposure to covid-19: Secondary | ICD-10-CM | POA: Diagnosis not present

## 2020-02-05 DIAGNOSIS — H9312 Tinnitus, left ear: Secondary | ICD-10-CM | POA: Diagnosis not present

## 2020-02-05 DIAGNOSIS — Z8249 Family history of ischemic heart disease and other diseases of the circulatory system: Secondary | ICD-10-CM | POA: Diagnosis not present

## 2020-02-05 LAB — CBC
HCT: 35.5 % — ABNORMAL LOW (ref 36.0–46.0)
Hemoglobin: 11.5 g/dL — ABNORMAL LOW (ref 12.0–15.0)
MCH: 31.3 pg (ref 26.0–34.0)
MCHC: 32.4 g/dL (ref 30.0–36.0)
MCV: 96.5 fL (ref 80.0–100.0)
Platelets: 156 10*3/uL (ref 150–400)
RBC: 3.68 MIL/uL — ABNORMAL LOW (ref 3.87–5.11)
RDW: 13.9 % (ref 11.5–15.5)
WBC: 2.8 10*3/uL — ABNORMAL LOW (ref 4.0–10.5)
nRBC: 0 % (ref 0.0–0.2)

## 2020-02-05 LAB — BASIC METABOLIC PANEL
Anion gap: 8 (ref 5–15)
BUN: 9 mg/dL (ref 6–20)
CO2: 23 mmol/L (ref 22–32)
Calcium: 8.6 mg/dL — ABNORMAL LOW (ref 8.9–10.3)
Chloride: 108 mmol/L (ref 98–111)
Creatinine, Ser: 0.8 mg/dL (ref 0.44–1.00)
GFR calc Af Amer: >60 mL/min (ref >60)
GFR calc non Af Amer: >60 mL/min (ref >60)
Glucose, Bld: 91 mg/dL (ref 70–99)
Potassium: 4.2 mmol/L (ref 3.5–5.1)
Sodium: 139 mmol/L (ref 135–145)

## 2020-02-05 LAB — APTT: aPTT: 31 seconds (ref 24–36)

## 2020-02-05 LAB — PROTIME-INR
INR: 1 (ref 0.8–1.2)
Prothrombin Time: 13.3 seconds (ref 11.4–15.2)

## 2020-02-05 LAB — SARS CORONAVIRUS 2 (TAT 6-24 HRS): SARS Coronavirus 2: NEGATIVE

## 2020-02-05 NOTE — Evaluation (Signed)
Physical Therapy Evaluation Patient Details Name: Kelsey Rodriguez MRN: [id number] DOB: October 18, 1991 Today's Date: 02/05/2020   History of Present Illness  Pt is a 29 yo female presenting with nausea/vomiting and headaches, upon admission HA thought to be due to brain mass in the setting of poorly controlled HIV/AIDS. 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  Clinical Impression  The pt was in bed upon arrival of PT, agreeable to PT evaluation at this time. The pt presents with limitations in functional mobility, activity tolerance, and functional stability compared to her prior level of function and independence due to above dx and underlying vertigo. The pt was able to demonstrate good safety with bed mobility and transfers as well as short ambulation as she has developed good coping strategies for ongoing dizziness related to vertigo. The pt would be safe to return home with family support and vestibular PT on an outpatient basis to improve the pt's activity tolerance, participation, and independence.      Follow Up Recommendations Outpatient PT;Supervision for mobility/OOB(OP vestibular PT)    Equipment Recommendations  None recommended by PT    Recommendations for Other Services       Precautions / Restrictions Precautions Precautions: Fall Restrictions Weight Bearing Restrictions: No      Mobility  Bed Mobility Overal bed mobility: Independent                Transfers Overall transfer level: Needs assistance Equipment used: None Transfers: Sit to/from Stand Sit to Stand: Supervision         General transfer comment: supervision for balance due to dizziness with mobiltiy, pt does not need physical assist and did not use AD  Ambulation/Gait Ambulation/Gait assistance: Supervision Gait Distance (Feet): 20  Feet Assistive device: None Gait Pattern/deviations: WFL(Within Functional Limits);Step-through pattern;Decreased stride length Gait velocity: sig decreased Gait velocity interpretation: <1.8 ft/sec, indicate of risk for recurrent falls General Gait Details: pt requesting to stay in room due to increased HA with bright light in hallway, dizziness remained constant, 4 episodes of minor LOB when pt turned, able to self-correct  Stairs            Wheelchair Mobility    Modified Rankin (Stroke Patients Only)       Balance Overall balance assessment: Needs assistance Sitting-balance support: No upper extremity supported;Feet supported Sitting balance-Leahy Scale: Normal Sitting balance - Comments: independent   Standing balance support: No upper extremity supported;During functional activity Standing balance-Leahy Scale: Fair Standing balance comment: no UE support, multiple episozes of dizziness, increased with turning.               High Level Balance Comments: pt able to perform standing marches without UE support for dynamic SLS, no LOB but pt with increased effort and challenge to maintain             Pertinent Vitals/Pain Pain Assessment: 0-10 Pain Score: 8  Pain Location: headache (right side) wors with lights and sunlight Pain Descriptors / Indicators: Dull;Grimacing;Headache Pain Intervention(s): Limited activity within patient's tolerance;Monitored during session;Patient requesting pain meds-RN notified    Home Living Family/patient expects to be discharged to:: Private residence Living Arrangements: Spouse/significant other Available Help at Discharge: Family;Available PRN/intermittently Type of Home: House Home Access: Stairs to enter Entrance Stairs-Rails: Psychiatric nurse of Steps: 2 Home Layout: One level Home Equipment: Walker - 2 wheels;Crutches Additional Comments: husband works during  day, other family nearby    Prior  Function Level of Independence: Independent         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     Hand Dominance   Dominant Hand: Right    Extremity/Trunk Assessment   Upper Extremity Assessment Upper Extremity Assessment: Overall WFL for tasks assessed    Lower Extremity Assessment Lower Extremity Assessment: Overall WFL for tasks assessed    Cervical / Trunk Assessment Cervical / Trunk Assessment: Normal  Communication   Communication: No difficulties  Cognition Arousal/Alertness: Awake/alert Behavior During Therapy: WFL for tasks assessed/performed Overall Cognitive Status: Within Functional Limits for tasks assessed                                        General Comments      Exercises     Assessment/Plan    PT Assessment Patient needs continued PT services  PT Problem List Decreased mobility;Decreased coordination;Decreased activity tolerance;Decreased balance       PT Treatment Interventions Therapeutic exercise;Gait training;Balance training;Patient/family education;Therapeutic activities;Functional mobility training;Stair training    PT Goals (Current goals can be found in the Care Plan section)  Acute Rehab PT Goals Patient Stated Goal: return home PT Goal Formulation: With patient Time For Goal Achievement: 02/19/20 Potential to Achieve Goals: Good    Frequency Min 2X/week   Barriers to discharge        Co-evaluation               AM-PAC PT "6 Clicks" Mobility  Outcome Measure Help needed turning from your back to your side while in a flat bed without using bedrails?: None Help needed moving from lying on your back to sitting on the side of a flat bed without using bedrails?: None Help needed moving to and from a bed to a chair (including a wheelchair)?: A Little Help needed standing up from a chair using your arms (e.g., wheelchair or bedside chair)?: A  Little Help needed to walk in hospital room?: A Little Help needed climbing 3-5 steps with a railing? : A Little 6 Click Score: 20    End of Session Equipment Utilized During Treatment: Gait belt Activity Tolerance: Patient tolerated treatment well;Treatment limited secondary to medical complications (Comment)(dizziness) Patient left: in bed;with call bell/phone within reach Nurse Communication: Mobility status;Other (comment)(need for vestibular PT to address dizziness) PT Visit Diagnosis: Difficulty in walking, not elsewhere classified (R26.2)    Time: [id number] PT Time Calculation (min) (ACUTE ONLY): 17 min   Charges:   PT Evaluation $PT Eval Low Complexity: 1 Low          Karma Ganja, PT, DPT   Acute Rehabilitation Department Pager #: [phone]   Otho Bellows 02/05/2020, 11:10 AM

## 2020-02-05 NOTE — Consult Note (Signed)
Date of Admission:  02/04/2020          Reason for Consult: Headache, AIDS CNS lesion   Referring Provider: Dr. Andria Frames   Assessment:  1. Headaches likely due to  2. CNS lesion which is likely  3. 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 Poor adherence to ARV and toxo medications    Plan:  1. Check HIV viral load and CD4 count  2. Hold antitoxo medications for now 3. One could consider doing LP to check for Toxoplasma by PCR, Cell count and differential 4. I doubt Neurosurgery would perform brain biopsy  Active Problems:   Intractable headache   Intracranial mass   Scheduled Meds: . amitriptyline  25 mg Oral QHS  . Darunavir-Cobicisctat-Emtricitabine-Tenofovir Alafenamide  1 tablet Oral Q breakfast  . enoxaparin (LOVENOX) injection  40 mg Subcutaneous Q24H   Continuous Infusions: PRN Meds:.acetaminophen, hydrocortisone, hydrocortisone, lidocaine, naproxen, ondansetron  HPI: Kelsey Rodriguez is a 29 y.o. female living with HIV and AIDS with poor history of adherence to her antiretroviral regimen.  She was seen in November and in the hospital with possible toxoplasmosis.  At that time no lumbar puncture was done I believe due to the fact that she had some evidence of midline shift.  Her plasma IgG was positive and she was started on treatment for toxoplasmosis pyrimethamine leucovorin and sulfadiazine.  Apparently had trouble tolerating her sulfadiazine in particular with nausea and anorexia.  She saw Terri Piedra on November 18, 2019 was changed to clindamycin along with continuing her pyrimethamine.  She was on Symtuza and viral load did drop between November and December through 137,002 71,900.  She admitted to intermittent adherence to Terri Piedra at the time.  She was seen in the ER yesterday as she developed worsening headache since Friday.  MRI of the brain shows continue enhancing right temporal lobe mass  that is 2's 0.6 cm in greatest dimensions.  It is not enlarged but also has not regressed since prior MRI.  Note we have never firmly establish the diagnosis of toxoplasmosis but this still remains the most likely cause of her brain lesion.  I would suspect if this was a lymphoma that would have worsened in the last 3 months.  The fact that it is stable in size just that this is not lymphoma but likely toxoplasmosis that has not been properly treated in large part due to the fact the patient does not likely adherent to her HIV or toxoplasma medications.  In talk to her that if she ing to [facility] today she did admit to missing medications both ARV and anti toxoplasmosis medcations.  She says she doesn't like taking pills. On further probing it seems more to do with stigma and shame of having HIV than actually taking pills  I put it to her that if she were diagnosed with metastatic breast cancer but told she could take 1 pill once a day to achieve full control of her cancer that she would very likely take that medication religiously every day to control her cancer and live out her life with her family and friends.  I think it is much more about stigma.  That being said it is possible that she could eventually be on long-acting antiretrovirals including the newly FDA approved CABENUVA. Not this regimen is FDA approved for stably suppressed patients but we are studying it in a RCT in the ACTG study  for patients who are not adherent though they have to get through 6 months of oral therapy with $ incentives to stay suppressed to get randomized to [facility] one could also consider going off label and using this compound as a DOT with the patient  For now we will check labs including viral load and CD4 count and continue his Symtuza but hold her toxoplasmosis treatment in case we decide to do a diagnostic intervention such as a lumbar puncture.  If she is still viremic and seems to be not taking her  medications which seems to be the case by interviewing her I would then reinitiate her toxoplasma medications as well with close follow-up in the outpatient world.  She has been getting her medications mailed to her through was along pharmacy and certainly would be good to see if she has been having these filled though that does not tell us if she is active in taking the medications and I think that is the bigger problem.  Her primary infectious disease doctor Dr. Baxter Flattery will be on service tomorrow and see the patient   Review of Systems: Review of Systems  Constitutional: Negative for chills, diaphoresis, fever, malaise/fatigue and weight loss.  HENT: Negative for congestion, hearing loss, sore throat and tinnitus.   Eyes: Negative for blurred vision and double vision.  Respiratory: Negative for cough, sputum production, shortness of breath and wheezing.   Cardiovascular: Negative for chest pain, palpitations and leg swelling.  Gastrointestinal: Negative for abdominal pain, blood in stool, constipation, diarrhea, heartburn, melena, nausea and vomiting.  Genitourinary: Negative for dysuria, flank pain and hematuria.  Musculoskeletal: Negative for back pain, falls, joint pain and myalgias.  Skin: Negative for itching and rash.  Neurological: Positive for headaches. Negative for dizziness, sensory change, focal weakness, loss of consciousness and weakness.  Endo/Heme/Allergies: Does not bruise/bleed easily.  Psychiatric/Behavioral: Positive for depression. Negative for memory loss and suicidal ideas. The patient is not nervous/anxious.     Past Medical History:  Diagnosis Date  . 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 Adrenal insufficiency ([place])   . Anemia of chronic disease 03/11/2012  . Back pain of lumbar region with sciatica 02/12/2015  . Bell's palsy 08/26/2013  . 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 Chronic back pain   . Chronic leg pain    bilateral knees, ankles  . Fatigue   . 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 HIV (human immunodeficiency virus infection) ([place]) 02/2012  . Laceration of ankle, right 11/18/2012  . Lumbar radiculopathy   . Meningitis 02/18/2018  . Pelvic pain   . PID (acute pelvic inflammatory disease) 02/26/2018  . Reflux esophagitis 03/11/2012  . Tuberculosis   . 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 Vertigo     Social History   Tobacco Use  . Smoking status: Never Smoker  . Smokeless tobacco: Never Used  Substance Use Topics  . Alcohol use: No    Alcohol/week: 0.0 standard drinks    Comment: socially  . Drug use: No    Family History  Problem Relation Age of Onset  . Heart disease Father        Vague not clearly cardiac   Allergies  Allergen Reactions  . Hydrocodone Itching and Nausea Only    Tolerates Oxycodone  . Tramadol Itching and Nausea Only    Tolerates oxycodone    OBJECTIVE: Blood pressure 102/69, pulse 72, temperature 98.2 F (36.8 C), temperature source Oral, resp. rate 18, height 5\' 5"  (1.651 m), weight 84.1  kg, last menstrual period 02/01/2020, SpO2 100 %.  Physical Exam Constitutional:      General: She is not in acute distress.    Appearance: She is not diaphoretic.  HENT:     Head: Normocephalic and atraumatic.     Right Ear: External ear normal.     Left Ear: External ear normal.     Nose: Nose normal.     Mouth/Throat:     Pharynx: No oropharyngeal exudate.  Eyes:     General: No scleral icterus.    Conjunctiva/sclera: Conjunctivae normal.     Pupils: Pupils are equal, round, and reactive to light.  Cardiovascular:     Rate and Rhythm: Normal rate and regular rhythm.     Heart sounds: No friction rub.  Pulmonary:     Effort: Pulmonary effort is normal. No respiratory distress.     Breath sounds: No wheezing.  Abdominal:     General: There is no distension.     Palpations: Abdomen is soft.     Tenderness: There is no abdominal tenderness. There is no rebound.  Musculoskeletal:         General: Normal range of motion.     Cervical back: Normal range of motion and neck supple.  Lymphadenopathy:     Cervical: No cervical adenopathy.  Skin:    General: Skin is warm and dry.     Coloration: Skin is not pale.     Findings: No erythema or rash.  Neurological:     Mental Status: She is alert and oriented to person, place, and time.     Coordination: Coordination normal.  Psychiatric:        Attention and Perception: Attention and perception normal.        Mood and Affect: Mood is depressed.        Speech: Speech normal.        Behavior: Behavior normal.        Cognition and Memory: Cognition and memory normal.        Judgment: Judgment normal.     Lab Results Lab Results  Component Value Date   WBC 2.8 (L) 02/05/2020   HGB 11.5 (L) 02/05/2020   HCT 35.5 (L) 02/05/2020   MCV 96.5 02/05/2020   PLT 156 02/05/2020    Lab Results  Component Value Date   CREATININE 0.80 02/05/2020   BUN 9 02/05/2020   NA 139 02/05/2020   K 4.2 02/05/2020   CL 108 02/05/2020   CO2 23 02/05/2020    Lab Results  Component Value Date   ALT 20 02/04/2020   AST 21 02/04/2020   ALKPHOS 81 02/04/2020   BILITOT 0.4 02/04/2020     Microbiology: Recent Results (from the past 240 hour(s))  SARS CORONAVIRUS 2 (TAT 6-24 HRS) Nasopharyngeal Nasopharyngeal Swab     Status: None   Collection Time: 02/04/20  7:00 PM   Specimen: Nasopharyngeal Swab  Result Value Ref Range Status   SARS Coronavirus 2 NEGATIVE NEGATIVE Final    Comment: (NOTE) SARS-CoV-2 target nucleic acids are NOT DETECTED. The SARS-CoV-2 RNA is generally detectable in upper and lower respiratory specimens during the acute phase of infection. Negative results do not preclude SARS-CoV-2 infection, do not rule out co-infections with other pathogens, and should not be used as the sole basis for treatment or other patient management decisions. Negative results must be combined with clinical observations, patient history,  and epidemiological information. The expected result is Negative. Fact Sheet for Patients: SugarRoll.be  Fact Sheet for Healthcare Providers: https://www.woods-mathews.com/ This test is not yet approved or cleared by the [place] FDA and  has been authorized for detection and/or diagnosis of SARS-CoV-2 by FDA under an Emergency Use Authorization (EUA). This EUA will remain  in effect (meaning this test can be used) for the duration of the COVID-19 declaration under Section 56 4(b)(1) of the Act, 21 U.S.C. section 360bbb-3(b)(1), unless the authorization is terminated or revoked sooner. Performed at [facility], [place] [address] [place], [place] [address]     Alcide Evener, [facility] [phone] pager  02/05/2020, 11:55 AM

## 2020-02-05 NOTE — Progress Notes (Signed)
Family Medicine Teaching Service Daily Progress Note Intern Pager: [phone]  Patient name: Kelsey Rodriguez Medical record number: [id number] Date of birth: 04-11-1991 Age: 29 y.o. Gender: female  Primary Care Provider: Gerlene Fee, DO Consultants: ID Code Status: FULL  Pt Overview and Major Events to Date:  2/20 Admitted, ID consulted  Assessment and Plan: Kelsey Rodriguez is a 29 y.o. female presenting with AoC HA w/ N/V . 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   #Acute on chronic headache likely secondary to prior CNS lesion without mass effect Patient with chronic headache syndrome likely secondary to brain mass in the setting of poorly controlled HIV/AIDS.  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  MRI shows mild reduction in surrounding inflammation, stable mass with ring-enhancing lesion.  Cannot rule out lymphoma.  Although patient is without B-cell symptoms or stigmata weight loss.  Patient endorsed compliance with HIV medications but did miss recent ID appointment in December.  Was previously treated with sulfadiazine but did not tolerate, was transitioned to clindamycin for toxoplasmosis.  Overall appears compliant with remainder of physicians including neurology, orthopedics, family medicine.  ID consulted and plans to see today.  Headache improved after migraine cocktail in ED.  Has mild headache returning, does not endorse nausea at this time.  No neurologic deficits.  Treats headache at home with Tylenol, naproxen, amitriptyline.  -ID consulted, appreciate recommendations  -Amitriptyline 25  mg -Tylenol, naproxen as needed -If not felt to be toxoplasmosis, consider consulting neurosurgery for specimen biopsy  HIV  AIDS Absolute lymphocytes count 1190.  CD4 count was 36 on 11/2019.  Currently on ART.  Endorses compliance.  No opportunistic infection prophylaxis such as PCP, histoplasmosis, toxoplasmosis, cryptococcus, MAC.  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  -ID recommendations appreciated -A.m. CD4 count and HIV viral load pending -Continue home ART  Chronic MSK pain Does not currently endorse any pain at this time. History of bilateral AVN, recent rotator cuff pain, lumbar radiculopathy.  Recently on high NSAIDs for shoulder pain.  Recent orthopedics appointment recommended follow-up with neurology for EMG for sciatic issues.  -Multimodality pain regimen including Tylenol, naproxen, lidocaine, K pad as needed -PT evaluation  FEN/GI:Regular diet Prophylaxis:lovenox  Disposition: Home pending further medical work up; ID to see today.  Subjective:  She continues to have a headache more pronounced on the right side. No changes in vision and is a 8/10 pain. She is no longer nauseous. Would like to know she is getting the best possible care and is considering transferring to a tertiary center.   Objective: Temp:  [98.2 F (36.8 C)-98.7 F (37.1 C)] 98.2 F (36.8 C) (02/21 0530) Pulse Rate:  [72-112] 72 (02/21 0530) Resp:  [18-20] 18 (02/21 0530) BP: (102-129)/(69-98) 102/69 (02/21 0530) SpO2:  [100 %] 100 % (02/21 0530) Weight:  [84.1 kg-84.4 kg] 84.1 kg (02/21 0530)  Physical Exam:  General: Appears well, no acute distress. Age appropriate. Sitting up on the side of bed attempting to eat breakfast. Husband is at bedside. Cardiac: RRR, normal heart sounds, no murmurs Respiratory: CTAB, normal effort Extremities: No edema or cyanosis. U+L ext 5/5 strength Skin: Warm and dry, no rashes  noted Neuro: alert and oriented, no focal  deficits. CN II-XII grossly intact. DTR present 2+.  Psych: normal affect   Laboratory: Recent Labs  Lab 02/04/20 1511 02/05/20 0513  WBC 3.4* 2.8*  HGB 12.0 11.5*  HCT 38.0 35.5*  PLT 172 156   Recent Labs  Lab 02/04/20 1511 02/05/20 0513  NA 139 139  K 3.6 4.2  CL 106 108  CO2 23 23  BUN 14 9  CREATININE 0.82 0.80  CALCIUM 9.2 8.6*  PROT 8.3*  --   BILITOT 0.4  --   ALKPHOS 81  --   ALT 20  --   AST 21  --   GLUCOSE 81 91     Imaging/Diagnostic Tests:  MRI HEAD WITHOUT AND WITH CONTRAST COMPARISON:  Brain MRI 10/28/2019 and earlier. IMPRESSION: 1. Continued enhancing right temporal lobe mass, 2.6 cm now and either not regressed or recurrent since the November MRI. Mildly less regional vasogenic edema than before. No midline shift or intracranial mass effect now. If the diagnosis of CNS toxoplasmosis is in doubt then another top differential consideration would be CNS Lymphoma.  2. No 2nd lesion or new intracranial abnormality identified.   Gerlene Fee, DO 02/05/2020, 7:43 AM PGY-1, [place] Intern pager: [phone], text pages welcome

## 2020-02-05 NOTE — Discharge Summary (Signed)
[facility] Discharge Summary  Patient name: Kelsey Rodriguez Medical record number: [id number] Date of birth: August 09, 1991 Age: 29 y.o. 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 female, no acute distress Cardio: Normal S1 and S2, RRR, No murmurs or rubs.   Pulm: CTAB, normal WOB. Abdomen: Bowel sounds normal. Abdomen soft and non-tender.  Extremities: No peripheral edema. Warm/ well perfused.   Neuro: Cranial nerves grossly intact   Brief Hospital Course:  Kelsey Rodriguez a 29 y.o.femalepresenting with AoC HA w/ N/V. 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 MRI shows mild reduction in surrounding inflammation, stable mass with ring-enhancing lesion.  ID consulted.Headache improved after migraine cocktail in ED. No neurologic deficits. Amitriptyline 25  mg continued. Tylenol, naproxen as needed. LP deferred due to concern that the procedure would cause herniation. Subsequently started on IV Bactrim 2/23 awaiting oral medication for treatment medication to be compounded. 2/24 Started on IV azithromycin weekly. Started on PO Sulfadiazine 1500mg  QID, Pyrimethamine 200 mg x 1 Then 75 mg daily, and Leucovorin 30 mg daily. Sulfadiazine was stopped due to patient intolerance and IV clindamycin was subsequently started.  On 3/5, patient was transitioned to p.o. clindamycin.  Patient was able to tolerate this with the addition of Protonix.  Repeat MRI performed on 3/8 which showed decreased size of right temporal mass with mildly decreased surrounding vasogenic edema.  She was discharged home on PO clindamycin, pyrimethamine, leuocovorin, IV azirthomycin weekly, PO bactrim.  New onset seizures On 3/1 had witnessed generalized tonic-clonic seizure which was treated with loading dose and maintenance keppra 500mg  BID without reoccurrence during hospitalization. EEG completed showing cortical dysfunction likely secondary to underlying lesion without epileptiform discharges. She did not have a recurrence of seizure activity while inpatient.  Neurology followed inpatient and will follow up 4 weeks outpatient. No driving x6 months.   HIVAIDS CD4 <35. Lymphocyte count 847. Viral load 131,000. Held home ART due to concern for IRIS. Antibiotics started as above. ART restarted on 3/6 and patient tolerated well.  She did note from GI upset, but this improved with addition of pantoprazole.  She was continued on ART and pantoprazole on discharge.  Moderate Depression w/ suicidal ideation PHQ9 score of 13. Moderate depression with several days of suicidal ideation with previous attempt. 2/25 Psychiatric consult recommended inpatient psych treatment. She was reevaluated 3/1 and no longer endorsing SI and inpatient psych was no longer recommended. Patient was started on  fluoxetine prior to discharge.  Issues for Follow Up:  1. Ensure continued use of toxoplasmosis treatment 2. Ensure continued ART 3. Patient should follow up with neurology in 4 weeks as  outpatient.  No driving for 6 months. 4. F/U with ID in 1 Week. 5. Patient should continue to take Keppra BID 6. D/c'ed on steroids to decrease cerebral edema  Significant Procedures: None  Significant Labs and Imaging:  Recent Labs  Lab 02/17/20 0219 02/19/20 0314 02/20/20 0349  WBC 7.6 6.2 7.5  HGB 12.4 12.6 12.1  HCT 37.3 38.5 36.6  PLT 225 239 248   Recent Labs  Lab 02/17/20 0219 02/17/20 0219 02/18/20 0610 02/18/20 0610 02/19/20 0314 02/20/20 0349  NA 138  --  138  --  136 137  K 5.2*   < > 4.2   < > 4.4 4.4  CL 106  --  104  --  106 105  CO2 21*  --  23  --  21* 23  GLUCOSE 124*  --  110*  --  114* 98  BUN 8  --  7  --  9 12  CREATININE 0.90  --  0.82  --  0.79 1.06*  CALCIUM 9.8  --  9.3  --  9.3 9.1  ALKPHOS  --   --   --   --   --  64  AST  --   --   --   --   --  18  ALT  --   --   --   --   --  18  ALBUMIN  --   --   --   --   --  3.4*   < > = values in this interval not displayed.     CT HEAD WO CONTRAST  Result Date: 02/13/2020 CLINICAL DATA:  Seizure EXAM: CT HEAD WITHOUT CONTRAST TECHNIQUE: Contiguous axial images were obtained from the base of the skull through the vertex without intravenous contrast. COMPARISON:  10/28/2019.  MRI 10/28/2019 and 02/04/2020 FINDINGS: Brain: Vasogenic edema is again noted within the right temporal lobe, less pronounced than on prior study. This corresponds to the area of known mass lesion seen on prior MRIs. No midline shift. No hemorrhage or hydrocephalus. Vascular: No hyperdense vessel or unexpected calcification. Skull: No acute calvarial abnormality. Sinuses/Orbits: Visualized paranasal sinuses and mastoids clear. Orbital soft tissues unremarkable. Other: None IMPRESSION: Area vasogenic edema within the right temporal lobe  corresponding to the area of mass lesion seen on prior MRI. Electronically Signed   By: Rolm Baptise M.D.   On: 02/13/2020 15:05   MR BRAIN W WO CONTRAST  Result Date: 02/20/2020 CLINICAL DATA:  Follow-up toxoplasmosis. Acute on chronic headaches. History of HIV. EXAM: MRI HEAD WITHOUT AND WITH CONTRAST TECHNIQUE: Multiplanar, multiecho pulse sequences of the brain and surrounding structures were obtained without and with intravenous contrast. CONTRAST:  7.42mL GADAVIST GADOBUTROL 1 MMOL/ML IV SOLN COMPARISON:  Head CT 02/13/2020 and MRI 02/04/2020 FINDINGS: Brain: A peripherally enhancing lesion in the right superior temporal gyrus has decreased in size, now measuring 1.4 x 1.3 x 1.1 cm. Extensive surrounding vasogenic edema has mildly decreased with slightly decreased mass effect on the temporal horn of the right lateral ventricle. There is no midline shift. No new enhancing intracranial lesion or new region of edema is seen. A few punctate foci of T2 hyperintensity in the left cerebral hemispheric white matter are unchanged and nonspecific. There is no evidence of acute infarct, intracranial hemorrhage, or hydrocephalus. Vascular: Major intracranial vascular flow voids are preserved. Skull and upper cervical spine: Unremarkable bone marrow signal. Sinuses/Orbits: Unremarkable orbits. Paranasal sinuses and mastoid air cells are clear. Other: None.  IMPRESSION: 1. Decreased size of right temporal mass with mildly decreased surrounding vasogenic edema. 2. No new intracranial abnormality. Electronically Signed   By: Logan Bores M.D.   On: 02/20/2020 08:35   MR BRAIN W WO CONTRAST  Result Date: 02/04/2020 CLINICAL DATA:  29 year old female with HIV. History of toxoplasmosis infection in November. Headache and nausea since yesterday. EXAM: MRI HEAD WITHOUT AND WITH CONTRAST TECHNIQUE: Multiplanar, multiecho pulse sequences of the brain and surrounding structures were obtained without and with intravenous  contrast. CONTRAST:  8.82mL GADAVIST GADOBUTROL 1 MMOL/ML IV SOLN COMPARISON:  Brain MRI 10/28/2019 and earlier. FINDINGS: Brain: Since November a round and irregular rim enhancing masslike area in the right lateral temporal lobe, involving both the superior and middle temporal gyri has not regressed. The enhancing component is stable to minimally larger since November and has a slightly different shape now, encompassing 26 by 20 x 25 mm (AP by transverse by CC) vs previously 25 x 19 x 24 mm. The core lesion is dark on T2 with mild restricted diffusion. The relatively stable size since November would argue against a high-grade glioma. Confluent surrounding T2 and FLAIR hyperintensity in a pattern suggesting vasogenic edema has only mildly regressed. Mild regional mass effect is stable. There is no midline shift. There is minimal overlying dural thickening as seen in November, but no other enhancing brain lesion. No superimposed restricted diffusion to suggest acute infarction. No ventriculomegaly, extra-axial collection or acute intracranial hemorrhage. Pearline Cables and white matter signal outside of the involved right hemisphere remains within normal limits. Cervicomedullary junction and pituitary are within normal limits. Vascular: Major intracranial vascular flow voids are stable. The major dural venous sinuses are enhancing and appear to be patent. Skull and upper cervical spine: Negative visible cervical spine and spinal cord. Normal bone marrow signal. Sinuses/Orbits: Stable and negative. Other: Mastoids remain clear. Visible internal auditory structures appear normal. IMPRESSION: 1. Continued enhancing right temporal lobe mass, 2.6 cm now and either not regressed or recurrent since the November MRI. Mildly less regional vasogenic edema than before. No midline shift or intracranial mass effect now. If the diagnosis of CNS toxoplasmosis is in doubt then another top differential consideration would be CNS Lymphoma. 2. No  2nd lesion or new intracranial abnormality identified. Electronically Signed   By: Genevie Ann M.D.   On: 02/04/2020 16:52   DG CHEST PORT 1 VIEW  Result Date: 02/13/2020 CLINICAL DATA:  Altered EXAM: PORTABLE CHEST 1 VIEW COMPARISON:  10/29/2019 FINDINGS: The heart size and mediastinal contours are within normal limits. Both lungs are clear. The visualized skeletal structures are unremarkable. IMPRESSION: No active disease. Electronically Signed   By: Donavan Foil M.D.   On: 02/13/2020 16:06   EEG adult  Result Date: 02/13/2020 Lora Havens, MD     02/13/2020  7:22 PM Patient Name: Kileigh Tregoning MRN: [id number] Epilepsy Attending: Lora Havens Referring Physician/Provider: Dr Charmayne Sheer Date: 02/13/2020 Duration: 23.37 mins Patient history: 29 year old female with HIV/AIDS with large right hemispheric CNS lesion-toxoplasmosis versus lymphoma. EEG to evaluate for seizure Level of alertness: awake, asleep AEDs during EEG study: LEV Technical aspects: This EEG study was done with scalp electrodes positioned according to the 10-20 International system of electrode placement. Electrical activity was acquired at a sampling rate of 500Hz  and reviewed with a high frequency filter of 70Hz  and a low frequency filter of 1Hz . EEG data were recorded continuously and digitally stored. DESCRIPTION: During awake state, no clear posterior dominant rhythm was  seen. Sleep was characterized by vertex waves, sleep spindles (12-14Hz ), maximal frontocentral. There is also continuous generalized 3-5hz  theta-delta slowing in right temporal region.  Hyperventilation and photic stimulation were not performed. ABNORMALITY - Continuous slow, right temporal region IMPRESSION: This study is suggestive of cortical dysfunction in right temporal region likely secondary to underlying lesion. No seizures or epileptiform discharges were seen throughout the recording. Priyanka Barbra Sarks   Results/Tests Pending at Time of Discharge:  None  Discharge Medications:  Allergies as of 02/20/2020      Reactions   Hydrocodone Itching, Nausea Only   Tolerates Oxycodone   Tramadol Itching, Nausea Only   Tolerates oxycodone      Medication List    STOP taking these medications   amitriptyline 25 MG tablet Commonly known as: ELAVIL     TAKE these medications   acetaminophen 500 MG tablet Commonly known as: TYLENOL Take 2 tablets (1,000 mg total) by mouth every 6 (six) hours as needed for moderate pain.   clindamycin 300 MG capsule Commonly known as: CLEOCIN Take 2 capsules (600 mg total) by mouth 4 (four) times daily for 28 days. Notes to patient: 6 pm Evening   dolutegravir 50 MG tablet Commonly known as: TIVICAY Take 1 tablet (50 mg total) by mouth daily for 28 days.   FLUoxetine 20 MG capsule Commonly known as: PROZAC Take 1 capsule (20 mg total) by mouth daily.   hydrocortisone 1 % ointment Apply 1 application topically 2 (two) times daily.   hydrocortisone 25 MG suppository Commonly known as: ANUSOL-HC Place 1 suppository (25 mg total) rectally 2 (two) times daily.   leucovorin 25 MG tablet Commonly known as: WELLCOVORIN Take 1 tablet (25 mg total) by mouth daily with supper for 28 days.   levETIRAcetam 500 MG tablet Commonly known as: KEPPRA Take 1 tablet (500 mg total) by mouth 2 (two) times daily for 28 days.   naproxen 500 MG tablet Commonly known as: Naprosyn Take 1 tablet (500 mg total) by mouth 2 (two) times daily with a meal.   pantoprazole 40 MG tablet Commonly known as: PROTONIX Take 1 tablet (40 mg total) by mouth daily. Notes to patient: 02/21/2020   pyrimethamine 25 MG tablet Commonly known as: DARAPRIM Take 3 tablets (75 mg total) by mouth daily with supper for 28 days. Notes to patient: 02/21/2020   sulfamethoxazole-trimethoprim 400-80 MG tablet Commonly known as: BACTRIM Take 1 tablet by mouth daily for 28 days.   Symtuza 800-150-200-10 MG Tabs Generic drug:  Darunavir-Cobicisctat-Emtricitabine-Tenofovir Alafenamide Take 1 tablet by mouth daily with breakfast. What changed: Another medication with the same name was added. Make sure you understand how and when to take each. Notes to patient: 02/21/2020   Symtuza 800-150-200-10 MG Tabs Generic drug: Darunavir-Cobicisctat-Emtricitabine-Tenofovir Alafenamide Take 1 tablet by mouth daily with breakfast. What changed: You were already taking a medication with the same name, and this prescription was added. Make sure you understand how and when to take each.       Discharge Instructions: Please refer to Patient Instructions section of EMR for full details.  Patient was counseled important signs and symptoms that should prompt return to medical care, changes in medications, dietary instructions, activity restrictions, and follow up appointments.   Follow-Up Appointments: Follow-up Information    Medicaid Transport Follow up.   Contact information: [phone] Call 48 hours in advance or  SCAT- application available through [employer] Follow up.  Specialty: Rehabilitation Why: They will call you to schedule an appointment for physical therapy Contact information: [id number] [place]       Carlyle Basques, MD Follow up.   Specialty: Infectious Diseases Why: 02/29/20 @ 245 pm Contact information: [place] [address] [phone]        Autry-Lott, Naaman Plummer, DO. Go on 02/23/2020.   Specialty: Family Medicine Why: Please go to hospital follow up appointment on 3/11 at [facility] 15 mins before appointment Contact information: [id number] [id number] [place], DO 02/23/2020, 9:51 AM PGY-2, [place]

## 2020-02-06 DIAGNOSIS — B589 Toxoplasmosis, unspecified: Secondary | ICD-10-CM

## 2020-02-06 DIAGNOSIS — H532 Diplopia: Secondary | ICD-10-CM

## 2020-02-06 DIAGNOSIS — H9192 Unspecified hearing loss, left ear: Secondary | ICD-10-CM

## 2020-02-06 DIAGNOSIS — H9312 Tinnitus, left ear: Secondary | ICD-10-CM

## 2020-02-06 LAB — CBC
HCT: 35.9 % — ABNORMAL LOW (ref 36.0–46.0)
Hemoglobin: 11.7 g/dL — ABNORMAL LOW (ref 12.0–15.0)
MCH: 31.5 pg (ref 26.0–34.0)
MCHC: 32.6 g/dL (ref 30.0–36.0)
MCV: 96.5 fL (ref 80.0–100.0)
Platelets: 158 10*3/uL (ref 150–400)
RBC: 3.72 MIL/uL — ABNORMAL LOW (ref 3.87–5.11)
RDW: 13.7 % (ref 11.5–15.5)
WBC: 3.1 10*3/uL — ABNORMAL LOW (ref 4.0–10.5)
nRBC: 0 % (ref 0.0–0.2)

## 2020-02-06 LAB — BASIC METABOLIC PANEL
Anion gap: 9 (ref 5–15)
BUN: 15 mg/dL (ref 6–20)
CO2: 22 mmol/L (ref 22–32)
Calcium: 8.9 mg/dL (ref 8.9–10.3)
Chloride: 106 mmol/L (ref 98–111)
Creatinine, Ser: 0.81 mg/dL (ref 0.44–1.00)
GFR calc Af Amer: >60 mL/min (ref >60)
GFR calc non Af Amer: >60 mL/min (ref >60)
Glucose, Bld: 105 mg/dL — ABNORMAL HIGH (ref 70–99)
Potassium: 4.2 mmol/L (ref 3.5–5.1)
Sodium: 137 mmol/L (ref 135–145)

## 2020-02-06 LAB — HIV-1 RNA QUANT-NO REFLEX-BLD
HIV 1 RNA Quant: 131000 copies/mL
LOG10 HIV-1 RNA: 5.117 log10copy/mL

## 2020-02-06 LAB — PATHOLOGIST SMEAR REVIEW

## 2020-02-06 LAB — CD4/CD8 (T-HELPER/T-SUPPRESSOR CELL)
CD4 absolute: <35 /uL — ABNORMAL LOW (ref 400–1790)
CD4%: 4 % — ABNORMAL LOW (ref 33–65)
CD8 T Cell Abs: 437 /uL (ref 190–1000)
CD8tox: 52 % — ABNORMAL HIGH (ref 12–40)
Ratio: 0.007 — ABNORMAL LOW (ref 1.0–3.0)
Total lymphocyte count: 847 /uL — ABNORMAL LOW (ref 1000–4000)

## 2020-02-06 NOTE — Progress Notes (Addendum)
Family Medicine Teaching Service Daily Progress Note Intern Pager: [phone]  Patient name: Kelsey Rodriguez Medical record number: [id number] Date of birth: 10-18-1991 Age: 29 y.o. Gender: female  Primary Care Provider: Gerlene Fee, DO Consultants: ID Code Status: FULL  Pt Overview and Major Events to Date:  2/20 Admitted, ID consulted  Assessment and Plan: Chelcea Bih [name] is a 29 y.o. female presenting with AoC HA w/ N/V . 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  #Acute on chronic headache likely secondary to prior CNS lesion without mass effect MRI shows mild reduction in surrounding inflammation, stable mass with ring-enhancing lesion. Has mild headache returning, does not endorse nausea at this time.  No neurologic deficits.  Treats headache at home with Tylenol, naproxen, amitriptyline.  -ID consulted, appreciate recommendations: hold toxo medications consider LP for toxo PCR -Amitriptyline 25 mg -Tylenol, naproxen as needed  HIV  AIDS Absolute lymphocytes count 1190.  CD4 count was 36 on 11/2019. Currently on ART.  Endorses compliance.  No opportunistic infection prophylaxis such as PCP, histoplasmosis, toxoplasmosis, cryptococcus, MAC.  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 CD 4 <35. Lymphocyte count 847.  -ID recommendations appreciated -Viral load pending -Continue home ART  Chronic MSK pain Does not currently endorse any pain at this time. -Multimodality pain regimen including Tylenol, naproxen, lidocaine, K pad as needed -PT evaluation  FEN/GI:Regular  diet Prophylaxis:lovenox  Disposition: Home pending further medical work up; ID consulted.  Subjective:  She believes her headaches started due to standing out in the cold at the [facility] on Friday. Does not endorse headache at this time.  Objective: Temp:  [97.7 F (36.5 C)-98.4 F (36.9 C)] 97.7 F (36.5 C) (02/22 0551) Pulse Rate:  [70-79] 70 (02/22 0551) Resp:  [16-20] 16 (02/22 0551) BP: (105-110)/(67-74) 106/74 (02/22 0551) SpO2:  [100 %] 100 % (02/22 0551)  Physical Exam:  General: Appears well, no acute distress. Age appropriate. Sitting up in bed. Cardiac: RRR, normal heart sounds, no murmurs Respiratory: CTAB, normal effort Extremities: No edema or cyanosis. Skin: Warm and dry, no rashes noted Neuro: alert and oriented, no focal deficits Psych: normal affect   Laboratory: Recent Labs  Lab 02/04/20 1511 02/05/20 0513 02/06/20 0119  WBC 3.4* 2.8* 3.1*  HGB 12.0 11.5* 11.7*  HCT 38.0 35.5* 35.9*  PLT 172 156 158   Recent Labs  Lab 02/04/20 1511 02/05/20 0513 02/06/20 0119  NA 139 139 137  K 3.6 4.2 4.2  CL 106 108 106  CO2 _0 BUN _1 CREATININE 0.82 0.80 0.81  CALCIUM 9.2 8.6* 8.9  PROT 8.3*  --   --   BILITOT 0.4  --   --   ALKPHOS 81  --   --   ALT 20  --   --   AST 21  --   --   GLUCOSE 81 91 105*   Imaging/Diagnostic Tests:  MRI HEAD WITHOUT AND WITH CONTRAST COMPARISON:  Brain MRI 10/28/2019 and earlier. IMPRESSION: 1. Continued enhancing right temporal lobe mass, 2.6 cm now and either not regressed or recurrent since the November MRI. Mildly less regional vasogenic edema than before. No midline shift  or intracranial mass effect now. If the diagnosis of CNS toxoplasmosis is in doubt then another top differential consideration would be CNS Lymphoma. 2. No 2nd lesion or new intracranial abnormality identified.   Gerlene Fee, DO 02/06/2020, 8:00 AM PGY-1, [place] Intern pager: [phone], text  pages welcome

## 2020-02-06 NOTE — Progress Notes (Signed)
Physical Therapy Treatment Patient Details Name: Kelsey Rodriguez MRN: [id number] DOB: 05-01-1991 Today's Date: 02/06/2020    History of Present Illness Pt is a 29 yo female presenting with nausea/vomiting and headaches, upon admission HA thought to be due to brain mass in the setting of poorly controlled HIV/AIDS. 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    PT Comments    Pt with decreased dizziness to report today. Pt found to have L hypofunction and difficulty tracking objects left/right. Pt with saccades when tracking and also both undershoots and over shoots. Pt given VORx1 exercises. Pt with good understanding. Recommend vestibular outpatient PT to cont to address to ultimately decrease vertigo and improve function.    Follow Up Recommendations  Outpatient PT;Supervision for mobility/OOB(vestibular PT)     Equipment Recommendations  None recommended by PT    Recommendations for Other Services       Precautions / Restrictions Precautions Precautions: Fall Precaution Comments: L hypofunction, saccadic movement to the L Restrictions Weight Bearing Restrictions: No    Mobility  Bed Mobility Overal bed mobility: Independent                Transfers Overall transfer level: Modified independent Equipment used: None Transfers: Sit to/from Stand Sit to Stand: Supervision         General transfer comment: supervision for balance due to dizziness with mobiltiy, pt does not need physical assist and did not use AD  Ambulation/Gait Ambulation/Gait assistance: Supervision Gait Distance (Feet): 200 Feet Assistive device: None Gait Pattern/deviations: Step-through pattern;Decreased stride length Gait velocity: decreased   General Gait Details: guarded, holds head still, no episode of LOB   Stairs              Wheelchair Mobility    Modified Rankin (Stroke Patients Only)       Balance Overall balance assessment: Needs assistance Sitting-balance support: No upper extremity supported;Feet supported Sitting balance-Leahy Scale: Normal Sitting balance - Comments: independent   Standing balance support: No upper extremity supported;During functional activity Standing balance-Leahy Scale: Fair Standing balance comment: no UE support, multiple episozes of dizziness, increased with turning.                            Cognition Arousal/Alertness: Awake/alert Behavior During Therapy: WFL for tasks assessed/performed Overall Cognitive Status: Within Functional Limits for tasks assessed                                        Exercises Other Exercises Other Exercises: gave patient VORx1 exercises to complete 3 reps, 3x/day, pt able to tolearte 20 sec prior to severe dizziness, increased to 30sec by 3rd time, pt with extreme difficulty staying focused on "A" and has to go really slow    General Comments General comments (skin integrity, edema, etc.): Vestibular assessment: pt with saccadic movement when tracking both L and R, L worse. Pt is behind in tracking but then also over corrects. Pt reports she has to close her eyes sometimes in the car when cars are passing them because it makes her really dizzy      Pertinent Vitals/Pain Pain Assessment: No/denies pain    Home Living  Prior Function            PT Goals (current goals can now be found in the care plan section) Progress towards PT goals: Progressing toward goals    Frequency    Min 2X/week      PT Plan Current plan remains appropriate    Co-evaluation              AM-PAC PT "6 Clicks" Mobility   Outcome Measure  Help needed turning from your back to your side while in a flat bed without using bedrails?: None Help needed moving from lying on your back  to sitting on the side of a flat bed without using bedrails?: None Help needed moving to and from a bed to a chair (including a wheelchair)?: None Help needed standing up from a chair using your arms (e.g., wheelchair or bedside chair)?: None Help needed to walk in hospital room?: A Little Help needed climbing 3-5 steps with a railing? : A Little 6 Click Score: 22    End of Session Equipment Utilized During Treatment: Gait belt Activity Tolerance: Patient tolerated treatment well;Treatment limited secondary to medical complications (Comment) Patient left: in bed;with call bell/phone within reach Nurse Communication: Mobility status;Other (comment) PT Visit Diagnosis: Difficulty in walking, not elsewhere classified (R26.2)     Time: [id number] PT Time Calculation (min) (ACUTE ONLY): 19 min  Charges:  $Therapeutic Exercise: 8-22 mins                     Kittie Plater, PT, DPT Acute Rehabilitation Services Pager #: [phone] Office #: [phone]    Berline Lopes 02/06/2020, 1:53 PM

## 2020-02-06 NOTE — Progress Notes (Addendum)
[facility] for Infectious Disease  Date of Admission:  02/04/2020      Total days of antibiotics 0     ASSESSMENT: Madalynn Bih [name] is a 29 y.o. 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 During last hospitalization in November we were unable to check LP given midline shift which has resolved on recent MRI. Would recommend checking LP for cell count, routine culture, fungal culture, Cryptococcal Ag PCR, Toxoplasmosis PCR,  EBV PCR (lymphoma thought to be less likely per radiology read), documentation of opening pressure please.  Further treatment recommendations pending LP.   In consideration of her having been off any regular dosing of Symtuza for her HIV treatment, would hold this now as well for a few weeks to avoid IRIS effect with CNS lesion.   Will D/C her enoxaparin and start SCDs in preparation to avoid delay with coordinating LP (last dose 2/21 @ 9pm).    PLAN: 1. Please consult Neurology for LP  2. CSF studies Toxo PCR, Crypto PCR, EBV PCR, cell count, routine culture, fungal culture  3. Hold Symtuza for now 4. Will D/C Enoxaparin in prep for #1, SCDs for now     Principal Problem:   CNS toxoplasmosis, suspected  Active Problems:   HIV (human immunodeficiency virus infection) ([place])   AIDS (acquired immune deficiency syndrome) (HCC)   Intractable headache   Intracranial mass   . amitriptyline  25 mg Oral QHS    SUBJECTIVE: Her headaches are under better control with pain medication here but still having increased pain with movement, bearing down and coughing. The severe headache started Friday when she also experienced associated nausea, near syncope, room spinning and   She is only taking her Symtuza maybe 2-3 doses a week at best. "I miss it more than I take it." Last saw Marya Amsler in December 2020 2 weeks into her presumed CNS toxoplasmosis treatment and was switched to  Pyramethamine-leucovirine and clindamycin as she was intolerant to sulfadiazine (nausea and interrupted appetite). She discontinued this after about a week of taking it due to side effects with her appetite. She has had persistent dizziness and occasional double vision along with less severe intermittent headaches that has caused her to seek care with Neurology and Ophthalmology outpatient. She is having a hard time linking these symptoms to her brain lesion. Asking if the cold makes headaches worse in particular.    Review of Systems: Review of Systems  Constitutional: Negative for chills, fever, malaise/fatigue and weight loss.  HENT: Positive for hearing loss (Mild in Left year ) and tinnitus (L ear ).   Eyes: Positive for double vision and photophobia. Negative for blurred vision, pain and discharge.  Respiratory: Negative for cough and shortness of breath.   Cardiovascular: Negative for chest pain.  Gastrointestinal: Negative for abdominal pain, diarrhea and vomiting.  Genitourinary: Negative for dysuria.  Musculoskeletal: Negative for back pain, falls, joint pain and neck pain.  Neurological: Positive for headaches. Negative for tingling, tremors, sensory change, speech change, focal weakness, seizures, loss of consciousness and weakness.    Allergies  Allergen Reactions  . Hydrocodone Itching and Nausea Only    Tolerates Oxycodone  . Tramadol Itching and Nausea Only    Tolerates oxycodone    OBJECTIVE: Vitals:   02/05/20 1825 02/06/20 0006 02/06/20 0551 02/06/20 1221  BP: 105/74 110/67 106/74 119/76  Pulse: 79 79 70 89  Resp: 20 16 16 16   Temp: 98.4 F (36.9 C) 97.8 F (36.6 C) 97.7 F (36.5 C) 98 F (36.7 C)  TempSrc: Oral Oral Oral Oral  SpO2: 100% 100% 100% 100%  Weight:      Height:       Body mass index is 30.84 kg/m.  Physical Exam Constitutional:      Appearance: She is well-developed.     Comments: Resting quietly in bed.   HENT:     Mouth/Throat:      Mouth: Mucous membranes are moist. No oral lesions.     Dentition: Normal dentition. No dental abscesses.     Pharynx: Oropharynx is clear. No oropharyngeal exudate.  Eyes:     Extraocular Movements: Extraocular movements intact.  Cardiovascular:     Rate and Rhythm: Normal rate and regular rhythm.     Heart sounds: Normal heart sounds.  Pulmonary:     Effort: Pulmonary effort is normal.     Breath sounds: Normal breath sounds.  Abdominal:     General: There is no distension.     Palpations: Abdomen is soft.     Tenderness: There is no abdominal tenderness.  Musculoskeletal:        General: Normal range of motion.     Cervical back: Normal range of motion.  Lymphadenopathy:     Cervical: No cervical adenopathy.  Skin:    General: Skin is warm and dry.     Findings: No rash.  Neurological:     Mental Status: She is alert and oriented to person, place, and time.     Cranial Nerves: No cranial nerve deficit.  Psychiatric:        Judgment: Judgment normal.     Comments: In good spirits      Lab Results Lab Results  Component Value Date   WBC 3.1 (L) 02/06/2020   HGB 11.7 (L) 02/06/2020   HCT 35.9 (L) 02/06/2020   MCV 96.5 02/06/2020   PLT 158 02/06/2020    Lab Results  Component Value Date   CREATININE 0.81 02/06/2020   BUN 15 02/06/2020   NA 137 02/06/2020   K 4.2 02/06/2020   CL 106 02/06/2020   CO2 22 02/06/2020    Lab Results  Component Value Date   ALT 20 02/04/2020   AST 21 02/04/2020   ALKPHOS 81 02/04/2020   BILITOT 0.4 02/04/2020     Microbiology: Recent Results (from the past 240 hour(s))  Culture, blood (Routine X 2) w Reflex to ID Panel     Status: None (Preliminary result)   Collection Time: 02/04/20  3:08 PM   Specimen: BLOOD  Result Value Ref Range Status   Specimen Description BLOOD RIGHT ARM  Final   Special Requests   Final    BOTTLES DRAWN AEROBIC AND ANAEROBIC Blood Culture results may not be optimal due to an inadequate volume of  blood received in culture bottles   Culture   Final    NO GROWTH 2 DAYS Performed at [facility], [place] [address], [place], [place] [address]    Report Status PENDING  Incomplete  Culture, blood (Routine X 2) w Reflex to ID Panel     Status: None (Preliminary result)   Collection Time: 02/04/20  3:11 PM   Specimen: BLOOD  Result Value Ref Range Status   Specimen Description BLOOD LEFT ARM  Final   Special Requests   Final    BOTTLES DRAWN AEROBIC AND ANAEROBIC Blood Culture  results may not be optimal due to an inadequate volume of blood received in culture bottles   Culture   Final    NO GROWTH 2 DAYS Performed at [facility], [place] [address], [place], [place] [address]    Report Status PENDING  Incomplete  SARS CORONAVIRUS 2 (TAT 6-24 HRS) Nasopharyngeal Nasopharyngeal Swab     Status: None   Collection Time: 02/04/20  7:00 PM   Specimen: Nasopharyngeal Swab  Result Value Ref Range Status   SARS Coronavirus 2 NEGATIVE NEGATIVE Final    Comment: (NOTE) SARS-CoV-2 target nucleic acids are NOT DETECTED. The SARS-CoV-2 RNA is generally detectable in upper and lower respiratory specimens during the acute phase of infection. Negative results do not preclude SARS-CoV-2 infection, do not rule out co-infections with other pathogens, and should not be used as the sole basis for treatment or other patient management decisions. Negative results must be combined with clinical observations, patient history, and epidemiological information. The expected result is Negative. Fact Sheet for Patients: SugarRoll.be Fact Sheet for Healthcare Providers: https://www.woods-mathews.com/ This test is not yet approved or cleared by the [place] FDA and  has been authorized for detection and/or diagnosis of SARS-CoV-2 by FDA under an Emergency Use Authorization (EUA). This EUA will remain  in effect (meaning this test can be used) for the duration  of the COVID-19 declaration under Section 56 4(b)(1) of the Act, 21 U.S.C. section 360bbb-3(b)(1), unless the authorization is terminated or revoked sooner. Performed at [facility], [name] [address], [place], [place] [address]      Janene Madeira, MSN, NP-C [facility] for Infectious Disease [email or web address] Pager: [phone] Office: [phone] [place]: [phone]

## 2020-02-07 DIAGNOSIS — B582 Toxoplasma meningoencephalitis: Secondary | ICD-10-CM

## 2020-02-07 LAB — BASIC METABOLIC PANEL
Anion gap: 11 (ref 5–15)
BUN: 12 mg/dL (ref 6–20)
CO2: 20 mmol/L — ABNORMAL LOW (ref 22–32)
Calcium: 9.1 mg/dL (ref 8.9–10.3)
Chloride: 106 mmol/L (ref 98–111)
Creatinine, Ser: 0.81 mg/dL (ref 0.44–1.00)
GFR calc Af Amer: >60 mL/min (ref >60)
GFR calc non Af Amer: >60 mL/min (ref >60)
Glucose, Bld: 94 mg/dL (ref 70–99)
Potassium: 4.5 mmol/L (ref 3.5–5.1)
Sodium: 137 mmol/L (ref 135–145)

## 2020-02-07 LAB — CBC
HCT: 36.6 % (ref 36.0–46.0)
Hemoglobin: 11.8 g/dL — ABNORMAL LOW (ref 12.0–15.0)
MCH: 31.1 pg (ref 26.0–34.0)
MCHC: 32.2 g/dL (ref 30.0–36.0)
MCV: 96.3 fL (ref 80.0–100.0)
Platelets: 161 10*3/uL (ref 150–400)
RBC: 3.8 MIL/uL — ABNORMAL LOW (ref 3.87–5.11)
RDW: 13.6 % (ref 11.5–15.5)
WBC: 3 10*3/uL — ABNORMAL LOW (ref 4.0–10.5)
nRBC: 0 % (ref 0.0–0.2)

## 2020-02-07 MED ORDER — SULFAMETHOXAZOLE-TRIMETHOPRIM 400-80 MG/5ML IV SOLN
10.0000 mg/kg/d | Freq: Two times a day (BID) | INTRAVENOUS | Status: DC
  Administered 2020-02-07 – 2020-02-08 (×3): 339.04 mg via INTRAVENOUS
  Filled 2020-02-07 (×4): qty 21.19

## 2020-02-07 MED ORDER — ENOXAPARIN SODIUM 40 MG/0.4ML ~~LOC~~ SOLN
40.0000 mg | SUBCUTANEOUS | Status: DC
  Administered 2020-02-07 – 2020-02-20 (×14): 40 mg via SUBCUTANEOUS
  Filled 2020-02-07 (×14): qty 0.4

## 2020-02-07 NOTE — Progress Notes (Signed)
[facility] for Infectious Disease  Date of Admission:  02/04/2020      Total days of antibiotics 0     ASSESSMENT: Kelsey Rodriguez is a 29 y.o. 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 Unable to obtain LP per neurology recommendations given MRI findings and edema - would be at too high risk for herniation. Will plan to resume treatment for toxoplasmosis at this time with repeat imaging in 2 weeks. Will start Bactrim IV today and plan to start Pyrimethamine/leucovorine and sulfadiazine. This has in the past affected her appetite and triggered nausea/anorexia. Will need support with symptom management though this treatment. Discussed with her that this is going to require continuous long term treatment as well as getting her HIV/AIDS under better control also.   Given her difficulty with taking her medications would prefer to have her stay here for 2 weeks with repeat head MRI at that time to re-evaluate lesion; should see a response at that point to help with confirmation of correct diagnosis. She is willing but reluctant.  Alternative differential CNS lymphoma, however would have anticipated this to have worsened since 23m ago without tx and she has had no other constitutional findings/symptoms.   Will defer resuming her Symtuza 1-2 weeks while we treat CNS toxo and plan to resume.   Will resume enoxaparin given contraindication for LP.    PLAN: 1. Start Bactrim IV  2. Will start pyrimethamine 75 mg QD + Leucovorin 25 mg QD + Sulfadiazine 1500 mg QD once available.      Principal Problem:   CNS toxoplasmosis, suspected  Active Problems:   HIV (human immunodeficiency virus infection) ([place])   AIDS (acquired immune deficiency syndrome) (HCC)   Intractable headache   Intracranial mass   . amitriptyline  25 mg Oral QHS  . enoxaparin (LOVENOX) injection  40 mg  Subcutaneous Q24H    SUBJECTIVE: Her headaches are under better control with pain medication here but still having increased pain with movement, bearing down and coughing. The severe headache started Friday when she also experienced associated nausea, near syncope, room spinning and   She is only taking her Symtuza maybe 2-3 doses a week at best. "I miss it more than I take it." Last saw Marya Amsler in December 2020 2 weeks into her presumed CNS toxoplasmosis treatment and was switched to Pyramethamine-leucovirine and clindamycin as she was intolerant to sulfadiazine (nausea and interrupted appetite). She discontinued this after about a week of taking it due to side effects with her appetite. She has had persistent dizziness and occasional double vision along with less severe intermittent headaches that has caused her to seek care with Neurology and Ophthalmology outpatient. She is having a hard time linking these symptoms to her brain lesion. Asking if the cold makes headaches worse in particular.    Review of Systems: Review of Systems  Constitutional: Negative for chills, fever, malaise/fatigue and weight loss.  HENT: Positive for hearing loss (Mild in Left year ) and tinnitus (L ear ).   Eyes: Positive for double vision and photophobia. Negative for blurred vision, pain and discharge.  Respiratory: Negative for cough and shortness of breath.   Cardiovascular: Negative for chest pain.  Gastrointestinal: Negative for abdominal pain, diarrhea and vomiting.  Genitourinary: Negative for dysuria.  Musculoskeletal: Negative for back pain, falls, joint pain and neck pain.  Neurological: Positive for headaches. Negative for tingling, tremors, sensory change, speech change, focal weakness, seizures, loss of consciousness and weakness.    Allergies  Allergen Reactions  . Hydrocodone Itching and Nausea Only    Tolerates Oxycodone  . Tramadol Itching and Nausea Only    Tolerates oxycodone    OBJECTIVE:  Vitals:   02/06/20 1747 02/06/20 2341 02/07/20 0601 02/07/20 1158  BP: (!) 127/92 106/69 107/77 (!) 116/92  Pulse: 91 87 78 80  Resp: 16 18 18 16   Temp: 98.7 F (37.1 C) 98.1 F (36.7 C) 98 F (36.7 C) 98.2 F (36.8 C)  TempSrc: Oral Oral Oral Oral  SpO2: 100% 99% 100% 100%  Weight:   83.9 kg   Height:       Body mass index is 30.77 kg/m.  Physical Exam Constitutional:      Appearance: She is well-developed.     Comments: Resting quietly in bed.   HENT:     Mouth/Throat:     Mouth: Mucous membranes are moist. No oral lesions.     Dentition: Normal dentition. No dental abscesses.     Pharynx: Oropharynx is clear. No oropharyngeal exudate.  Eyes:     Extraocular Movements: Extraocular movements intact.  Cardiovascular:     Rate and Rhythm: Normal rate and regular rhythm.     Heart sounds: Normal heart sounds.  Pulmonary:     Effort: Pulmonary effort is normal.     Breath sounds: Normal breath sounds.  Abdominal:     General: There is no distension.     Palpations: Abdomen is soft.     Tenderness: There is no abdominal tenderness.  Musculoskeletal:        General: Normal range of motion.     Cervical back: Normal range of motion.  Lymphadenopathy:     Cervical: No cervical adenopathy.  Skin:    General: Skin is warm and dry.     Findings: No rash.  Neurological:     Mental Status: She is alert and oriented to person, place, and time.     Cranial Nerves: No cranial nerve deficit.  Psychiatric:        Judgment: Judgment normal.     Comments: In good spirits      Lab Results Lab Results  Component Value Date   WBC 3.0 (L) 02/07/2020   HGB 11.8 (L) 02/07/2020   HCT 36.6 02/07/2020   MCV 96.3 02/07/2020   PLT 161 02/07/2020    Lab Results  Component Value Date   CREATININE 0.81 02/07/2020   BUN 12 02/07/2020   NA 137 02/07/2020   K 4.5 02/07/2020   CL 106 02/07/2020   CO2 20 (L) 02/07/2020    Lab Results  Component Value Date   ALT 20 02/04/2020    AST 21 02/04/2020   ALKPHOS 81 02/04/2020   BILITOT 0.4 02/04/2020     Microbiology: Recent Results (from the past 240 hour(s))  Culture, blood (Routine X 2) w Reflex to ID Panel     Status: None (Preliminary result)   Collection Time: 02/04/20  3:08 PM   Specimen: BLOOD  Result Value Ref Range Status   Specimen Description BLOOD RIGHT ARM  Final   Special Requests   Final    BOTTLES DRAWN AEROBIC AND ANAEROBIC Blood Culture results may not be optimal due to an inadequate volume of blood received in culture bottles   Culture   Final    NO GROWTH 3 DAYS Performed at  [facility], [place] [address], [place], [place] [address]    Report Status PENDING  Incomplete  Culture, blood (Routine X 2) w Reflex to ID Panel     Status: None (Preliminary result)   Collection Time: 02/04/20  3:11 PM   Specimen: BLOOD  Result Value Ref Range Status   Specimen Description BLOOD LEFT ARM  Final   Special Requests   Final    BOTTLES DRAWN AEROBIC AND ANAEROBIC Blood Culture results may not be optimal due to an inadequate volume of blood received in culture bottles   Culture   Final    NO GROWTH 3 DAYS Performed at [facility], [place] [address], [place], [place] [address]    Report Status PENDING  Incomplete  SARS CORONAVIRUS 2 (TAT 6-24 HRS) Nasopharyngeal Nasopharyngeal Swab     Status: None   Collection Time: 02/04/20  7:00 PM   Specimen: Nasopharyngeal Swab  Result Value Ref Range Status   SARS Coronavirus 2 NEGATIVE NEGATIVE Final    Comment: (NOTE) SARS-CoV-2 target nucleic acids are NOT DETECTED. The SARS-CoV-2 RNA is generally detectable in upper and lower respiratory specimens during the acute phase of infection. Negative results do not preclude SARS-CoV-2 infection, do not rule out co-infections with other pathogens, and should not be used as the sole basis for treatment or other patient management decisions. Negative results must be combined with clinical observations,  patient history, and epidemiological information. The expected result is Negative. Fact Sheet for Patients: SugarRoll.be Fact Sheet for Healthcare Providers: https://www.woods-mathews.com/ This test is not yet approved or cleared by the [place] FDA and  has been authorized for detection and/or diagnosis of SARS-CoV-2 by FDA under an Emergency Use Authorization (EUA). This EUA will remain  in effect (meaning this test can be used) for the duration of the COVID-19 declaration under Section 56 4(b)(1) of the Act, 21 U.S.C. section 360bbb-3(b)(1), unless the authorization is terminated or revoked sooner. Performed at [facility], [place] [address], [place], [place] [address]      Janene Madeira, MSN, NP-C [facility] for Infectious Disease [email or web address] Pager: [phone] Office: [phone] [place]: [phone]

## 2020-02-07 NOTE — Progress Notes (Addendum)
Physical Therapy Treatment/Vestibular Patient Details Name: Kelsey Rodriguez MRN: [id number] DOB: May 28, 1991 Today's Date: 02/07/2020    History of Present Illness Pt is a 29 yo female presenting with nausea/vomiting and headaches, upon admission HA thought to be due to brain mass in the setting of poorly controlled HIV/AIDS. 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    PT Comments    Complex pt re-assessed for vestibular pathology.  Unfortunately for Kelsey Rodriguez, her testing today matched central pathology and given her abscess this would not be out of the realm of possibilities.  She has (+) saccades, slow saccadic testing and negative HIT bil as well as reports of HA associated dizziness and double vision.  All of these things would point toward a central issue.  I think x 1 exercises continue to be appropriate as long as we emphasized not doing them to the point of increasing her symptoms (nausea, dizziness, and HA).  A trial of OP vestibular rehab would be beneficial to see if treatment can help her compensate or lessen her dizziness.  PT will continue to follow acutely for safe mobility progression.  Follow Up Recommendations  Outpatient PT;Other (comment)(trial of OP vestibular therapy)     Equipment Recommendations  None recommended by PT    Recommendations for Other Services   NA     Precautions / Restrictions Precautions Precautions: Fall    Mobility  Bed Mobility Overal bed mobility: Independent                Transfers Overall transfer level: Modified independent Equipment used: None Transfers: Sit to/from Stand Sit to Stand: Modified independent (Device/Increase time)         General transfer comment: Pt stands slowly and makes her way to the bathroom without pause.    Ambulation/Gait Ambulation/Gait assistance:  Modified independent (Device/Increase time) Gait Distance (Feet): 20 Feet Assistive device: IV Pole   Gait velocity: decreased Gait velocity interpretation: 1.31 - 2.62 ft/sec, indicative of limited community ambulator General Gait Details: Slow, but steady holding IV pole.        Balance Overall balance assessment: Needs assistance Sitting-balance support: Feet supported;Bilateral upper extremity supported Sitting balance-Leahy Scale: Good Sitting balance - Comments: Pt reports she often gets dizzy leaning over towards the floor and coming back up, this also increases her HA   Standing balance support: No upper extremity supported;Single extremity supported Standing balance-Leahy Scale: Good Standing balance comment: Deficits noted, but no external assist needed even with dynamic activities as long as she moves slowly.            02/07/20 1334  Symptom Behavior  Subjective history of current problem Pt wears glasses and has recently gotten her eyes examined (since the HAs got worse and due to intermittent double vision), HA with associated dizziness, light sensitivity, long standing h/o vertigo (years), at times neck pain, at times tinnitus (today she heard her pulse in her L ear), no recent whiplash or head injury, no recent URI, no recent antibiotic use, no recent change in her meds, no h/o strabismus or corrective eye surgery, (+) motion sensitivity in the car (has really limited her driving), no fullness.   Type of Dizziness  Imbalance;Unsteady with head/body turns;Lightheadedness  Frequency of Dizziness intermittent  Duration of Dizziness with movement/with increased HA  Symptom Nature Motion provoked;Variable  Aggravating Factors Activity in general;Turning body quickly;Turning head quickly;Sitting  in moving car;Driving;Forward bending  Relieving Factors Lying supine;Closing eyes;Dark room;Rest;Slow movements  Progression of Symptoms No change since onset  History of similar  episodes Yes, yearly, more with HA now that HA symptoms are increasing (so 2 episodes in the past 3 mo).   Oculomotor Exam  Spontaneous Absent  Gaze-induced  Comment (very slight, and with repeat testing supressed)  Smooth Pursuits Saccades  Saccades Hypometric;Undershoots;Slow  Comment Pt with poor tracking past midline to the left.  Saccadic smooth persuits (not normal for her age), and very much undershooting with saccadic testing with slow, multiple hop rebound to target.  She has difficulty converging with R eye better able to converge than left, but stops converging both ~ 6" from her face. At times, I feel the left and right eye are not conjugate when attempting to track.  She reports the eye doctor "took an x-ray and saw something", but she has been to her eye doctor recently.  Glasses not present to inspect.   Oculomotor Exam-Fixation Suppressed   Left Head Impulse negative  Right Head Impulse negative  Auditory  Comments grossly deminished in L ear  Other Tests  Tragal negative  Comments valsalva negative                       Cognition Arousal/Alertness: Awake/alert Behavior During Therapy: WFL for tasks assessed/performed Overall Cognitive Status: Within Functional Limits for tasks assessed                                        Exercises Other Exercises Other Exercises: reviewed x 1 seated horizontal and vertical exercises with pt, reviewing keeping symptoms low and repeating 3-5 times or more per day with a goal of 1 min each direction, and increasing her speed as the exercise gets easier (if the exercise gets easier).         Pertinent Vitals/Pain Pain Assessment: Faces Faces Pain Scale: Hurts little more Pain Location: HA Pain Descriptors / Indicators: Aching Pain Intervention(s): Limited activity within patient's tolerance;Monitored during session;Repositioned           PT Goals (current goals can now be found in the care plan  section) Progress towards PT goals: Progressing toward goals    Frequency    Min 4X/week((vestibular))      PT Plan Current plan remains appropriate       AM-PAC PT "6 Clicks" Mobility   Outcome Measure  Help needed turning from your back to your side while in a flat bed without using bedrails?: None Help needed moving from lying on your back to sitting on the side of a flat bed without using bedrails?: None Help needed moving to and from a bed to a chair (including a wheelchair)?: None Help needed standing up from a chair using your arms (e.g., wheelchair or bedside chair)?: None Help needed to walk in hospital room?: None Help needed climbing 3-5 steps with a railing? : None 6 Click Score: 24    End of Session   Activity Tolerance: Patient limited by pain Patient left: in bed;with call bell/phone within reach;Other (comment)(lights dimmed)   PT Visit Diagnosis: Difficulty in walking, not elsewhere classified (R26.2)     Time: [id number] PT Time Calculation (min) (ACUTE ONLY): 42 min  Charges:  $Therapeutic Exercise: 8-22 mins $Therapeutic Activity: 8-22 mins 1 re-eval  Verdene Lennert, PT, DPT  Acute Rehabilitation [phone] pager [phone] office  @ [place]: [phone]   02/07/2020, 1:33 PM

## 2020-02-07 NOTE — Progress Notes (Signed)
Family Medicine Teaching Service Daily Progress Note Intern Pager: [phone]  Patient name: Kelsey Rodriguez Medical record number: [id number] Date of birth: [birth year] Age: 29 y.o. Gender: female  Primary Care Provider: Gerlene Fee, DO Consultants: ID Code Status: FULL  Pt Overview and Major Events to Date:  2/20 Admitted, ID consulted  Assessment and Plan: Kelsey Rodriguez is a 29 y.o. female presenting with AoC HA w/ N/V . 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  #Acute on chronic headache likely secondary to prior CNS lesion without mass effect Continuing to endorse mild headache.MRI shows mild reduction in surrounding inflammation, stable mass with ring-enhancing lesion. Has mild headache returning, does not endorse nausea at this time.  No neurologic deficits.  Treats headache at home with Tylenol, naproxen, amitriptyline.  -ID consulted, appreciate recommendations: hold toxo medications consider LP for CSF studies toxo, crypto, EBV, cell count, fungal culture, and routine culture. -Bactrim 10mg /kg/day -Amitriptyline 25 mg -Tylenol, naproxen as needed  HIV  AIDS Absolute lymphocytes count 1190.  CD4 count was 36 on 11/2019. Currently on ART.  Endorses compliance. 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 CD 4 <35. Lymphocyte count 847. Viral load 131K. -ID recommendations appreciated: hold home ART -Bactrim 10mg /kg/day  Chronic MSK pain Does not currently endorse any pain at this time. -Multimodality pain regimen including Tylenol, naproxen, lidocaine, K pad as needed -PT  evaluation  FEN/GI:Regular diet Prophylaxis:lovenox  Disposition: Home pending further medical work up; ID consulted.  Subjective:  Endorses mild headache. Concerns about LP procedure cause further headache.  Objective: Temp:  [98 F (36.7 C)-98.7 F (37.1 C)] 98 F (36.7 C) (02/23 0601) Pulse Rate:  [78-91] 78 (02/23 0601) Resp:  [16-18] 18 (02/23 0601) BP: (106-127)/(69-92) 107/77 (02/23 0601) SpO2:  [99 %-100 %] 100 % (02/23 0601) Weight:  [83.9 kg] 83.9 kg (02/23 0601)  Physical Exam:  General: Appears well, no acute distress. Age appropriate. Cardiac: RRR, normal heart sounds, no murmurs Respiratory: CTAB, normal effort Extremities: No edema or cyanosis. Skin: Warm and dry, no rashes noted Neuro: alert and oriented, no focal deficits. PERRL. Psych: normal affect   Laboratory: Recent Labs  Lab 02/05/20 0513 02/06/20 0119 02/07/20 0433  WBC 2.8* 3.1* 3.0*  HGB 11.5* 11.7* 11.8*  HCT 35.5* 35.9* 36.6  PLT 156 158 161   Recent Labs  Lab 02/04/20 1511 02/04/20 1511 02/05/20 0513 02/06/20 0119 02/07/20 0433  NA 139   < > 139 137 137  K 3.6   < > 4.2 4.2 4.5  CL 106   < > 108 106 106  CO2 23   < > 23 22 20*  BUN 14   < > 9 15 12   CREATININE 0.82   < > 0.80 0.81 0.81  CALCIUM 9.2   < > 8.6* 8.9 9.1  PROT 8.3*  --   --   --   --   BILITOT 0.4  --   --   --   --   ALKPHOS 81  --   --   --   --   ALT 20  --   --   --   --   AST 21  --   --   --   --  GLUCOSE 81   < > 91 105* 94   < > = values in this interval not displayed.   Imaging/Diagnostic Tests:  MRI HEAD WITHOUT AND WITH CONTRAST COMPARISON:  Brain MRI 10/28/2019 and earlier. IMPRESSION: 1. Continued enhancing right temporal lobe mass, 2.6 cm now and either not regressed or recurrent since the November MRI. Mildly less regional vasogenic edema than before. No midline shift or intracranial mass effect now. If the diagnosis of CNS toxoplasmosis is in doubt then another top differential  consideration would be CNS Lymphoma. 2. No 2nd lesion or new intracranial abnormality identified.   Gerlene Fee, DO 02/07/2020, 8:20 AM PGY-1, [place] Intern pager: [phone], text pages welcome

## 2020-02-08 LAB — CBC
HCT: 36 % (ref 36.0–46.0)
Hemoglobin: 11.6 g/dL — ABNORMAL LOW (ref 12.0–15.0)
MCH: 30.9 pg (ref 26.0–34.0)
MCHC: 32.2 g/dL (ref 30.0–36.0)
MCV: 95.7 fL (ref 80.0–100.0)
Platelets: 170 10*3/uL (ref 150–400)
RBC: 3.76 MIL/uL — ABNORMAL LOW (ref 3.87–5.11)
RDW: 13.7 % (ref 11.5–15.5)
WBC: 3.2 10*3/uL — ABNORMAL LOW (ref 4.0–10.5)
nRBC: 0 % (ref 0.0–0.2)

## 2020-02-08 LAB — BASIC METABOLIC PANEL
Anion gap: 10 (ref 5–15)
BUN: 9 mg/dL (ref 6–20)
CO2: 19 mmol/L — ABNORMAL LOW (ref 22–32)
Calcium: 8.8 mg/dL — ABNORMAL LOW (ref 8.9–10.3)
Chloride: 108 mmol/L (ref 98–111)
Creatinine, Ser: 0.9 mg/dL (ref 0.44–1.00)
GFR calc Af Amer: >60 mL/min (ref >60)
GFR calc non Af Amer: >60 mL/min (ref >60)
Glucose, Bld: 87 mg/dL (ref 70–99)
Potassium: 4.3 mmol/L (ref 3.5–5.1)
Sodium: 137 mmol/L (ref 135–145)

## 2020-02-08 LAB — PROTIME-INR
INR: 1 (ref 0.8–1.2)
Prothrombin Time: 12.6 seconds (ref 11.4–15.2)

## 2020-02-08 MED ORDER — DEXTROSE 5 % IV SOLN
1200.0000 mg | INTRAVENOUS | Status: DC
  Administered 2020-02-08 – 2020-02-15 (×2): 1200 mg via INTRAVENOUS
  Filled 2020-02-08 (×2): qty 1200

## 2020-02-08 MED ORDER — PYRIMETHAMINE-LEUCOVORIN 25-10 MG PO CAPS
25.0000 mg | ORAL_CAPSULE | Freq: Every day | ORAL | Status: DC
  Administered 2020-02-09 – 2020-02-19 (×11): 25 mg via ORAL
  Filled 2020-02-08 (×12): qty 1

## 2020-02-08 MED ORDER — PYRIMETHAMINE-LEUCOVORIN 50-20 MG PO CAPS
200.0000 mg | ORAL_CAPSULE | Freq: Once | ORAL | Status: AC
  Administered 2020-02-08: 200 mg via ORAL
  Filled 2020-02-08 (×2): qty 4

## 2020-02-08 MED ORDER — SULFADIAZINE 500 MG PO TABS
1500.0000 mg | ORAL_TABLET | Freq: Four times a day (QID) | ORAL | Status: DC
  Administered 2020-02-08 (×2): 1500 mg via ORAL
  Filled 2020-02-08 (×6): qty 3

## 2020-02-08 MED ORDER — PYRIMETHAMINE-LEUCOVORIN 50-20 MG PO CAPS
50.0000 mg | ORAL_CAPSULE | Freq: Every day | ORAL | Status: DC
  Administered 2020-02-09 – 2020-02-19 (×11): 50 mg via ORAL
  Filled 2020-02-08 (×11): qty 1

## 2020-02-08 NOTE — Progress Notes (Addendum)
FPTS Interim Progress Note  S: Mrs. Kelsey Rodriguez endorses depression. She is mostly alone when her husband goes to work. Her mother or cousins will sometimes pick her up to do things. She talks with her husband and she feels as if he does not understand that she is the patient and sick and that is why she is depressed not that her husband makes her depressed. He needs to be retested; last HIV test 2017 and was negative. They are sexually active. She says he wants to take prep. I encourage her to tell him to make an appointment to get started. She would like to talk with a psychiatrist/psychologist before starting medication for depression. She understands that this could be a source of outlook to her illness as to why she does not take her HIV medications regularly. She has thoughts of wanting to be dead or hurting herself. Her last time thinking this way was Friday prior to admission. She will sometimes drink 4 days out of the week. Her drink of choice is irish cream and whiskey mixed. She endorses that this makes her depression worse and she becomes restless and cannot sleep. She will "stay away" from her pills because she has thoughts of taking them all and attempting to end her life. She is not having these thought or thoughts of wanting to hurt others currently.  A/P: Moderate Depression PHQ9 score of 13 consistent with moderate depression. Expressed that there are several days of thoughts that she would be better off dead or of hurting herself. She is lonely and mostly does nothing when her husband is at work. Her medication compliance is likely multifactorial but a high percentage is likely due to her outlook on her illness due to her depression and the thought of ending her life with the very medication that is supposed to prolong it. Is not currently endorsing suicidal or homicidal ideation. Does not endorse financial issue or cultural stigmatizations. Amendable to seeking help outpatient. Encouraged to seek  help if thought arise again during hospitalization. -Consult inpatient psych  Gerlene Fee, DO 02/08/2020, 2:24 PM PGY-1, [place] Medicine Service pager [phone]

## 2020-02-08 NOTE — Progress Notes (Signed)
ID Pharmacy Progress Note   The pyrimethamine/leucovorin compound for [name] has arrived in the pharmacy this afternoon. We will switch her from IV Bactrim to Pyrimethamine/leucovorin + Sulfadiazine.     Plan Pyrimethamine 200 mg X 1 Then 75 mg daily Leucovorin 30 mg daily Sulfadiazine 1500 mg 4 times a day   Jimmy Footman, PharmD, [place], [place] Infectious Diseases Clinical Pharmacist Phone: [phone] 02/08/2020 3:31 PM

## 2020-02-08 NOTE — Progress Notes (Addendum)
Family Medicine Teaching Service Daily Progress Note Intern Pager: [phone]  Patient name: Kelsey Rodriguez Medical record number: [id number] Date of birth: March 16, 1991 Age: 29 y.o. Gender: female  Primary Care Provider: Gerlene Fee, DO Consultants: ID Code Status: FULL  Pt Overview and Major Events to Date:  2/20 Admitted, ID consulted  Assessment and Plan: Kelsey Rodriguez is a 29 y.o. female presenting with AoC HA w/ N/V . 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  #Acute on chronic headache likely secondary to prior CNS lesion without mass effect Endorsing mild headaches. MRI shows mild reduction in surrounding inflammation, stable mass with ring-enhancing lesion. No neurologic deficits. Treats headache at home with Tylenol, naproxen, amitriptyline.  -ID consulted, appreciate recommendations: ABX x2 weeks -IV Bactrim 10mg /kg/day -IV Azithromycin 1200mg  weekly -Amitriptyline 25 mg -Tylenol, naproxen as needed   HIV  AIDS Absolute lymphocytes count 1190.  CD4 count was 36 on 11/2019. Currently on ART.  Endorses compliance. 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 CD 4 <35. Lymphocyte count 847. Viral load 131K. -ID recommendations appreciated: hold home ART d/t IRIS -Bactrim 10mg /kg/day -IV Azithromycin 1200mg  weekly  Chronic MSK pain Does not currently endorse any pain at this time. -Multimodality pain regimen including Tylenol, naproxen, lidocaine, K pad as needed -PT evaluation  GOC Hx of medication noncompliance. Does not like to take pills. -Discussion on the benefits of health due to  medication compliance and any stigmas or cultural sensitivities -PHQ9 for possible depression affecting compliance -CSW consulted for recommendations on any barriers   FEN/GI:Regular diet Prophylaxis:lovenox  Disposition: Home pending ID recommendations of outpatient anitbiotics  Subjective:  Transportation is an issue. Her husband would have to take off of work to drive her daily if she has to come back daily for antibiotic treatment.   Objective: Temp:  [98 F (36.7 C)-98.3 F (36.8 C)] 98 F (36.7 C) (02/24 0542) Pulse Rate:  [76-93] 76 (02/24 0542) Resp:  [15-16] 15 (02/24 0542) BP: (103-118)/(73-92) 103/73 (02/24 0542) SpO2:  [95 %-100 %] 100 % (02/24 0542)  Physical Exam:  General: Appears well, no acute distress. Age appropriate. Cardiac: RRR, normal heart sounds, no murmurs Respiratory: CTAB, normal effort Extremities: No edema or cyanosis. Skin: Warm and dry, no rashes noted Neuro: alert and oriented, no focal deficits Psych: normal affect  Laboratory: Recent Labs  Lab 02/06/20 0119 02/07/20 0433 02/08/20 0439  WBC 3.1* 3.0* 3.2*  HGB 11.7* 11.8* 11.6*  HCT 35.9* 36.6 36.0  PLT 158 161 170   Recent Labs  Lab 02/04/20 1511 02/05/20 0513 02/06/20 0119 02/07/20 0433 02/08/20 0439  NA 139   < > 137 137 137  K 3.6   < > 4.2 4.5 4.3  CL 106   < > 106 106 108  CO2 23   < > 22 20* 19*  BUN 14   < > 15 12 9   CREATININE 0.82   < > 0.81 0.81 0.90  CALCIUM 9.2   < > 8.9 9.1 8.8*  PROT 8.3*  --   --   --   --   BILITOT 0.4  --   --   --   --   Arabella Merles  81  --   --   --   --   ALT 20  --   --   --   --   AST 21  --   --   --   --   GLUCOSE 81   < > 105* 94 87   < > = values in this interval not displayed.   Imaging/Diagnostic Tests: Now new imaging.  Gerlene Fee, DO 02/08/2020, 7:52 AM PGY-1, [place] Intern pager: [phone], text pages welcome

## 2020-02-08 NOTE — Progress Notes (Signed)
Physical Therapy Treatment Patient Details Name: Kelsey Rodriguez MRN: [id number] DOB: June 19, 1991 Today's Date: 02/08/2020    History of Present Illness Pt is a 29 yo female presenting with nausea/vomiting and headaches, upon admission HA thought to be due to brain mass in the setting of poorly controlled HIV/AIDS. 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    PT Comments    Patient up in the room mobilizing well on her own upon my entry.  She reports some symptoms when she leans over to place dishes she is washing on the floor keeping her head upright.  She demonstrated gaze stability exercises appropriately with symptoms provoked to 5/10.  She also reports she has to stay for further medications.  Will continue to follow and progress to standing x1 exercises next session in addition to adding some habituation.  May be able to complete HEP while inpatient if she stays.    Follow Up Recommendations  Outpatient PT;Other (comment)(for vestibular rehab)     Equipment Recommendations  None recommended by PT    Recommendations for Other Services       Precautions / Restrictions Precautions Precautions: Fall    Mobility  Bed Mobility Overal bed mobility: Independent                Transfers Overall transfer level: Modified independent     Sit to Stand: Modified independent (Device/Increase time)            Ambulation/Gait Ambulation/Gait assistance: Modified independent (Device/Increase time) Gait Distance (Feet): 45 Feet Assistive device: None Gait Pattern/deviations: WFL(Within Functional Limits)     General Gait Details: standing at sink upon my entry washing dishes of food brought from home, walked around bed to place dishes x 2   Stairs             Wheelchair Mobility    Modified Rankin (Stroke Patients  Only)       Balance     Sitting balance-Leahy Scale: Normal       Standing balance-Leahy Scale: Good Standing balance comment: washing dishes at sink                            Cognition Arousal/Alertness: Awake/alert Behavior During Therapy: WFL for tasks assessed/performed Overall Cognitive Status: Within Functional Limits for tasks assessed                                        Exercises Other Exercises Other Exercises: reviewed x 1 seated horizontal and vertical exercises with pt, issueing handout for tips with slowing speed of head motion if dizziness increases, keeping target in focus and stationary and without dipopia; also discussed progressing to standing next session.  She demonstrated appropriately and verbalized appropriately as well    General Comments        Pertinent Vitals/Pain Faces Pain Scale: Hurts little more Pain Location: HA Pain Descriptors / Indicators: Aching Pain Intervention(s): Monitored during session    Home Living                      Prior Function            PT Goals (current goals can now be found in the care plan section) Progress  towards PT goals: Progressing toward goals    Frequency    Min 4X/week(vestibular)      PT Plan Current plan remains appropriate    Co-evaluation              AM-PAC PT "6 Clicks" Mobility   Outcome Measure  Help needed turning from your back to your side while in a flat bed without using bedrails?: None Help needed moving from lying on your back to sitting on the side of a flat bed without using bedrails?: None Help needed moving to and from a bed to a chair (including a wheelchair)?: None Help needed standing up from a chair using your arms (e.g., wheelchair or bedside chair)?: None Help needed to walk in hospital room?: None Help needed climbing 3-5 steps with a railing? : None 6 Click Score: 24    End of Session   Activity Tolerance:  Patient tolerated treatment well Patient left: in bed;with call bell/phone within reach   PT Visit Diagnosis: Difficulty in walking, not elsewhere classified (R26.2)     Time: 1550-1605 PT Time Calculation (min) (ACUTE ONLY): 15 min  Charges:  $Therapeutic Exercise: 8-22 mins                     Kelsey Rodriguez, [place] [phone] 02/08/2020    Kelsey Rodriguez 02/08/2020, 5:40 PM

## 2020-02-08 NOTE — Progress Notes (Signed)
[facility] for Infectious Disease  Date of Admission:  02/04/2020      Total days of antibiotics 2  Bactrim      ASSESSMENT: Kelsey Rodriguez is a 29 y.o. 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 Unable to obtain LP per neurology recommendations given edema/mass effect with high risk for herniation. She is tolerating the IV Bactrim at present on day 2. Creatinine stable and potassium normal @ 4.3. Pyrimethamine/leucovorine and sulfadiazine should be in today or tomorrow to resume. We discussed this with her today trying to encourage her we will do our best to support her through symptoms and side effects as they come up but it is imperative to continue treating for the planned time frame.   Continue to hold Symtuza for now. Will plan to resume in 10-14 days.   Start weekly Azithromycin IV for MAC prophylaxis.   Given the severity of her current unresolved bran lesion would prefer to utilize direct observed therapy here so we can find the right combination of symptomatic support to get her through a long course of treatment. She will also need a repeat head MRI to evaluate for response on toxo treatment.     PLAN: 1. Continue Bactrim IV  2. Will start pyrimethamine 75 mg QD + Leucovorin 25 mg QD + Sulfadiazine 1500 mg QD once available.  3. Weekly Azithromycin IV      Principal Problem:   CNS toxoplasmosis, suspected  Active Problems:   HIV (human immunodeficiency virus infection) ([place])   AIDS (acquired immune deficiency syndrome) ([facility])   Intractable headache   Intracranial mass   . amitriptyline  25 mg Oral QHS  . enoxaparin (LOVENOX) injection  40 mg Subcutaneous Q24H    SUBJECTIVE: Headaches and neurologic findings unchanged. Still with dizziness.  Spending most time watching movies in the dark.    Review of Systems: Review of Systems    Constitutional: Negative for chills, fever, malaise/fatigue and weight loss.  HENT: Positive for hearing loss (Mild in Left year ) and tinnitus (L ear ).   Eyes: Positive for double vision and photophobia. Negative for blurred vision, pain and discharge.  Respiratory: Negative for cough and shortness of breath.   Cardiovascular: Negative for chest pain.  Gastrointestinal: Negative for abdominal pain, diarrhea and vomiting.  Genitourinary: Negative for dysuria.  Musculoskeletal: Negative for back pain, falls, joint pain and neck pain.  Neurological: Positive for headaches. Negative for tingling, tremors, sensory change, speech change, focal weakness, seizures, loss of consciousness and weakness.    Allergies  Allergen Reactions  . Hydrocodone Itching and Nausea Only    Tolerates Oxycodone  . Tramadol Itching and Nausea Only    Tolerates oxycodone    OBJECTIVE: Vitals:   02/07/20 1753 02/07/20 2309 02/08/20 0542 02/08/20 1223  BP: 118/74 104/75 103/73 109/81  Pulse: 93 88 76 92  Resp: _0 Temp: 98.2 F (36.8 C) 98.3 F (36.8 C) 98 F (36.7 C) 98 F (36.7 C)  TempSrc: Oral Oral Oral Oral  SpO2: 95% 98% 100% 100%  Weight:      Height:       Body mass index is 30.77 kg/m.  Physical Exam Constitutional:      Appearance: She is well-developed.     Comments: Resting quietly in bed.   HENT:  Mouth/Throat:     Mouth: Mucous membranes are moist. No oral lesions.     Dentition: Normal dentition. No dental abscesses.     Pharynx: Oropharynx is clear. No oropharyngeal exudate.  Eyes:     Extraocular Movements: Extraocular movements intact.  Cardiovascular:     Rate and Rhythm: Normal rate and regular rhythm.     Heart sounds: Normal heart sounds.  Pulmonary:     Effort: Pulmonary effort is normal.     Breath sounds: Normal breath sounds.  Abdominal:     General: There is no distension.     Palpations: Abdomen is soft.     Tenderness: There is no abdominal  tenderness.  Musculoskeletal:        General: Normal range of motion.     Cervical back: Normal range of motion.  Lymphadenopathy:     Cervical: No cervical adenopathy.  Skin:    General: Skin is warm and dry.     Findings: No rash.  Neurological:     Mental Status: She is alert and oriented to person, place, and time.     Cranial Nerves: No cranial nerve deficit.  Psychiatric:        Judgment: Judgment normal.     Comments: In good spirits      Lab Results Lab Results  Component Value Date   WBC 3.2 (L) 02/08/2020   HGB 11.6 (L) 02/08/2020   HCT 36.0 02/08/2020   MCV 95.7 02/08/2020   PLT 170 02/08/2020    Lab Results  Component Value Date   CREATININE 0.90 02/08/2020   BUN 9 02/08/2020   NA 137 02/08/2020   K 4.3 02/08/2020   CL 108 02/08/2020   CO2 19 (L) 02/08/2020    Lab Results  Component Value Date   ALT 20 02/04/2020   AST 21 02/04/2020   ALKPHOS 81 02/04/2020   BILITOT 0.4 02/04/2020     Microbiology: Recent Results (from the past 240 hour(s))  Culture, blood (Routine X 2) w Reflex to ID Panel     Status: None (Preliminary result)   Collection Time: 02/04/20  3:08 PM   Specimen: BLOOD  Result Value Ref Range Status   Specimen Description BLOOD RIGHT ARM  Final   Special Requests   Final    BOTTLES DRAWN AEROBIC AND ANAEROBIC Blood Culture results may not be optimal due to an inadequate volume of blood received in culture bottles   Culture   Final    NO GROWTH 3 DAYS Performed at [facility], [place] [address], [place], [place] [address]    Report Status PENDING  Incomplete  Culture, blood (Routine X 2) w Reflex to ID Panel     Status: None (Preliminary result)   Collection Time: 02/04/20  3:11 PM   Specimen: BLOOD  Result Value Ref Range Status   Specimen Description BLOOD LEFT ARM  Final   Special Requests   Final    BOTTLES DRAWN AEROBIC AND ANAEROBIC Blood Culture results may not be optimal due to an inadequate volume of blood received  in culture bottles   Culture   Final    NO GROWTH 3 DAYS Performed at [facility], [place] [address] [place], [place] [address]    Report Status PENDING  Incomplete  SARS CORONAVIRUS 2 (TAT 6-24 HRS) Nasopharyngeal Nasopharyngeal Swab     Status: None   Collection Time: 02/04/20  7:00 PM   Specimen: Nasopharyngeal Swab  Result Value Ref Range  Status   SARS Coronavirus 2 NEGATIVE NEGATIVE Final    Comment: (NOTE) SARS-CoV-2 target nucleic acids are NOT DETECTED. The SARS-CoV-2 RNA is generally detectable in upper and lower respiratory specimens during the acute phase of infection. Negative results do not preclude SARS-CoV-2 infection, do not rule out co-infections with other pathogens, and should not be used as the sole basis for treatment or other patient management decisions. Negative results must be combined with clinical observations, patient history, and epidemiological information. The expected result is Negative. Fact Sheet for Patients: SugarRoll.be Fact Sheet for Healthcare Providers: https://www.woods-mathews.com/ This test is not yet approved or cleared by the [place] FDA and  has been authorized for detection and/or diagnosis of SARS-CoV-2 by FDA under an Emergency Use Authorization (EUA). This EUA will remain  in effect (meaning this test can be used) for the duration of the COVID-19 declaration under Section 56 4(b)(1) of the Act, 21 U.S.C. section 360bbb-3(b)(1), unless the authorization is terminated or revoked sooner. Performed at [facility], [place] [address] [place], [place] [address]      Janene Madeira, MSN, NP-C [facility] [email or web address] Pager: [phone] Office: [phone] [place]: [phone]

## 2020-02-09 DIAGNOSIS — R45851 Suicidal ideations: Secondary | ICD-10-CM

## 2020-02-09 DIAGNOSIS — R110 Nausea: Secondary | ICD-10-CM

## 2020-02-09 LAB — CBC
HCT: 37.6 % (ref 36.0–46.0)
Hemoglobin: 12.2 g/dL (ref 12.0–15.0)
MCH: 31.1 pg (ref 26.0–34.0)
MCHC: 32.4 g/dL (ref 30.0–36.0)
MCV: 95.9 fL (ref 80.0–100.0)
Platelets: 167 10*3/uL (ref 150–400)
RBC: 3.92 MIL/uL (ref 3.87–5.11)
RDW: 13.5 % (ref 11.5–15.5)
WBC: 3.3 10*3/uL — ABNORMAL LOW (ref 4.0–10.5)
nRBC: 0 % (ref 0.0–0.2)

## 2020-02-09 LAB — CULTURE, BLOOD (ROUTINE X 2)
Culture: NO GROWTH
Culture: NO GROWTH

## 2020-02-09 LAB — BASIC METABOLIC PANEL
Anion gap: 9 (ref 5–15)
BUN: 8 mg/dL (ref 6–20)
CO2: 23 mmol/L (ref 22–32)
Calcium: 8.9 mg/dL (ref 8.9–10.3)
Chloride: 106 mmol/L (ref 98–111)
Creatinine, Ser: 0.88 mg/dL (ref 0.44–1.00)
GFR calc Af Amer: >60 mL/min (ref >60)
GFR calc non Af Amer: >60 mL/min (ref >60)
Glucose, Bld: 88 mg/dL (ref 70–99)
Potassium: 4 mmol/L (ref 3.5–5.1)
Sodium: 138 mmol/L (ref 135–145)

## 2020-02-09 MED ORDER — ONDANSETRON HCL 4 MG/2ML IJ SOLN: 4.0000 mg | Freq: Four times a day (QID) | INTRAMUSCULAR | Status: DC | PRN

## 2020-02-09 MED ORDER — ONDANSETRON HCL 4 MG/2ML IJ SOLN
4.0000 mg | Freq: Three times a day (TID) | INTRAMUSCULAR | Status: DC | PRN
  Administered 2020-02-09 – 2020-02-20 (×3): 4 mg via INTRAVENOUS
  Filled 2020-02-09 (×3): qty 2

## 2020-02-09 MED ORDER — CLINDAMYCIN PHOSPHATE 600 MG/50ML IV SOLN
600.0000 mg | Freq: Four times a day (QID) | INTRAVENOUS | Status: DC
  Administered 2020-02-09 – 2020-02-16 (×28): 600 mg via INTRAVENOUS
  Filled 2020-02-09 (×28): qty 50

## 2020-02-09 NOTE — Progress Notes (Signed)
    [facility] for Infectious Disease    Date of Admission:  02/04/2020   Total days of antibiotics 3           ID: Kelsey Rodriguez is a 29 y.o. female with poorly controlled, advanced HIV/AIDS with CNS toxo-partially treated admitted with intractable headaches  Principal Problem:   CNS toxoplasmosis, suspected  Active Problems:   HIV (human immunodeficiency virus infection) ([place])   AIDS (acquired immune deficiency syndrome) ([place])   Intractable headache   Intracranial mass    Subjective: Admitted to having severe depression and seen by psychiatry consultation yesterday. She reports that her stomach hurt after first doses of sulfadiazine and does not want to take it since it affects her appetite.  Medications:  . amitriptyline  25 mg Oral QHS  . enoxaparin (LOVENOX) injection  40 mg Subcutaneous Q24H  . Pyrimethamine-Leucovorin  50 mg Oral Daily   And  . Pyrimethamine-Leucovorin  25 mg Oral Q2000  . sulfaDIAZINE  1,500 mg Oral QID    Objective: Vital signs in last 24 hours: Temp:  [97.9 F (36.6 C)-98.9 F (37.2 C)] 98.3 F (36.8 C) (02/25 1159) Pulse Rate:  [76-104] 91 (02/25 1159) Resp:  [16-19] 19 (02/25 1159) BP: (100-125)/(66-87) 125/85 (02/25 1159) SpO2:  [99 %-100 %] 99 % (02/25 1159) Weight:  [84 kg] 84 kg (02/25 0510)  Physical Exam  Constitutional:  oriented to person, place, and time. appears well-developed and well-nourished. No distress.  HENT: [place]/AT, PERRLA, no scleral icterus Mouth/Throat: Oropharynx is clear and moist. No oropharyngeal exudate.  Cardiovascular: Normal rate, regular rhythm and normal heart sounds. Exam reveals no gallop and no friction rub.  No murmur heard.  Pulmonary/Chest: Effort normal and breath sounds normal. No respiratory distress.  has no wheezes.  Neck = supple, no nuchal rigidity Abdominal: Soft. Bowel sounds are normal.  exhibits no distension. There is no tenderness.  Lymphadenopathy: no cervical adenopathy. No axillary  adenopathy Neurological: alert and oriented to person, place, and time.  Skin: Skin is warm and dry. No rash noted. No erythema.  Psychiatric: flat  Lab Results Recent Labs    02/08/20 0439 02/09/20 0827  WBC 3.2* 3.3*  HGB 11.6* 12.2  HCT 36.0 37.6  NA 137 138  K 4.3 4.0  CL 108 106  CO2 19* 23  BUN 9 8  CREATININE 0.90 0.88    Microbiology: none Studies/Results: No results found.   Assessment/Plan: CNS toxo = probable ( based on imaging and toxo Ig G) - patient is intolerant of sulfadiazine. We will try clindamycin plus pyrimethamine and leucovorin to see if any difference. Alternatively if this does not work then will do bactrim. Plan to repeat mri in 2-3 wk to see if any improvement on treatment. Concern for noncompliance due depression  oi proph = will do weekly iv azithromycin for now plus bactrim ss daily  HIV disease= will hold symtuza until she tolerates her CNS toxo management, then reassess to restart ART  Nausea = supportive case  Depression/Suicide ideation = psych recommends inpatient care. I think she could be medically ready in 2-3 days but have to see if she tolerates all oral meds    [facility] for Infectious Diseases Cell: [phone] Pager: [phone]  02/09/2020, 3:09 PM

## 2020-02-09 NOTE — Progress Notes (Signed)
Family Medicine Teaching Service Daily Progress Note Intern Pager: [phone]  Patient name: Kelsey Rodriguez Medical record number: [id number] Date of birth: 1991-01-25 Age: 29 y.o. Gender: female  Primary Care Provider: Gerlene Fee, DO Consultants: ID Code Status: FULL  Pt Overview and Major Events to Date:  2/20 Admitted, ID consulted   Assessment and Plan: Kelsey Rodriguez is a 29 y.o. female presenting with AoC HA w/ N/V . 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  #Acute on chronic headache likely secondary to prior CNS lesion without mass effect No longer endorsing headaches. MRI shows mild reduction in surrounding inflammation, stable mass with ring-enhancing lesion. No neurologic deficits. T -ID consulted, appreciate recommendations: ABX x2 weeks -Sulfadiazine 1500mg  QID -PO Pyrimethamine 200 mg X 1 Then 75 mg daily -PO Leucovorin 30 mg daily -IV Azithromycin 1200mg  weekly -Amitriptyline 25 mg -Tylenol, naproxen as needed   HIV  AIDS Absolute lymphocytes count 1190.  CD4 count was 36 on 11/2019. Currently on ART.  Endorses compliance. 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 CD 4 <35. Lymphocyte count 847. Viral load 131K. -ID recommendations appreciated: hold home ART d/t IRIS -IV Azithromycin 1200mg  weekly -PO Sulfadiazine 1500mg  QID -PO Pyrimethamine 200 mg X 1 Then 75 mg daily -Leucovorin 30 mg daily  Chronic MSK pain Does not currently endorse any pain at this time. -Multimodality pain regimen including Tylenol, naproxen, lidocaine, K pad as needed -PT evaluation  Moderate  Depression w/ suicidal ideation PHQ9 score of 13. Moderate depression with several days of suicidal ideation with previous attempt  -Psych consult: inpatient psych recommended  FEN/GI:Regular diet Prophylaxis:lovenox  Disposition: Inpatient psych  Subjective:  Wants to take her medicines but pills are hard to take. Will take pills inpatient but will not take them if she goes home. If she goes home she will need IV.  Objective: Temp:  [97.9 F (36.6 C)-98.9 F (37.2 C)] 97.9 F (36.6 C) (02/25 0510) Pulse Rate:  [76-104] 76 (02/25 0510) Resp:  [16-18] 18 (02/25 0510) BP: (100-125)/(66-87) 100/66 (02/25 0510) SpO2:  [99 %-100 %] 99 % (02/25 0510) Weight:  [84 kg] 84 kg (02/25 0510)  Physical Exam:  General: Appears well, no acute distress. Age appropriate. Lying in bed. Cardiac: RRR, normal heart sounds, no murmurs Respiratory: CTAB, normal effort Extremities: No edema or cyanosis. Skin: Warm and dry, no rashes noted Neuro: alert and oriented, no focal deficits. PERRL Psych: flat affect  Laboratory: Recent Labs  Lab 02/06/20 0119 02/07/20 0433 02/08/20 0439  WBC 3.1* 3.0* 3.2*  HGB 11.7* 11.8* 11.6*  HCT 35.9* 36.6 36.0  PLT 158 161 170   Recent Labs  Lab 02/04/20 1511 02/05/20 0513 02/06/20 0119 02/07/20 0433 02/08/20 0439  NA 139   < > 137 137 137  K 3.6   < > 4.2 4.5 4.3  CL 106   < > 106 106 108  CO2 23   < > 22 20* 19*  BUN 14   < > 15 12 9   CREATININE 0.82   < > 0.81 0.81 0.90  CALCIUM 9.2   < > 8.9 9.1 8.8*  PROT 8.3*  --   --   --   --  BILITOT 0.4  --   --   --   --   ALKPHOS 81  --   --   --   --   ALT 20  --   --   --   --   AST 21  --   --   --   --   GLUCOSE 81   < > 105* 94 87   < > = values in this interval not displayed.   Imaging/Diagnostic Tests: Now new imaging.  Gerlene Fee, DO 02/09/2020, 7:38 AM PGY-1, [place] Intern pager: [phone], text pages welcome

## 2020-02-09 NOTE — Consult Note (Signed)
Telepsych Consultation   Reason for Consult:  "endorses several days of suicidal ideation" Referring Physician:  Dr Andria Frames Location of Patient: [facility] [id number] Location of Provider: [facility]  Patient Identification: Kelsey Rodriguez MRN:  [id number] Principal Diagnosis: CNS toxoplasmosis ([place]) Diagnosis:  Principal Problem:   CNS toxoplasmosis, suspected  Active Problems:   HIV (human immunodeficiency virus infection) ([place])   AIDS (acquired immune deficiency syndrome) ([place])   Intractable headache   Intracranial mass   Total Time spent with patient: 30 minutes  Subjective:   Kelsey Rodriguez is a 29 y.o. female patient.  Patient assessed by nurse practitioner.  Patient alert and oriented, answers appropriately.  Patient states "I came because I keep having headaches and dizziness, I have been struggling with health issues since 2013."  Patient tearful throughout assessment.  Patient states "I am really depressed."  Patient endorses suicidal ideations.  Patient reports prior suicide attempts x2 by overdosing on her medications, patient denies treatment.  Patient denies homicidal ideations.  Patient denies auditory and visual hallucinations.  Patient denies outpatient psychiatrist.  Patient denies family history of mental illness. Patient reports she lives alone with her husband.  Patient reports she uses alcohol states "I drink thinking it will calm me but it makes me more restless."  Patient reports difficulty sleeping and racing thoughts states "I feel like I am thinking too many things at once."  Patient reports drinking approximately 1 glass of liquor daily.  Patient denies access to weapons.  Patient stressors include health issues and inability to work since August 2020, patient states "I would like to go to work."    HPI: Patient admitted with complaints of headaches and dizziness.  Past Psychiatric History: Denies  Risk to Self:  Yes Risk to Others:  Denies Prior  Inpatient Therapy:  Denies Prior Outpatient Therapy:  Denies  Past Medical History:  Past Medical History:  Diagnosis Date  . 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 Adrenal insufficiency ([place])   . Anemia of chronic disease 03/11/2012  . Back pain of lumbar region with sciatica 02/12/2015  . Bell's palsy 08/26/2013  . 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 Chronic back pain   . Chronic leg pain    bilateral knees, ankles  . Fatigue   . 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 HIV (human immunodeficiency virus infection) ([place]) 02/2012  . Laceration of ankle, right 11/18/2012  . Lumbar radiculopathy   . Meningitis 02/18/2018  . Pelvic pain   . PID (acute pelvic inflammatory disease) 02/26/2018  . Reflux esophagitis 03/11/2012  . Tuberculosis   . 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 Vertigo     Past Surgical History:  Procedure Laterality Date  . APPENDECTOMY  ~ 2000  . DILATION AND CURETTAGE OF UTERUS  2008  . ESOPHAGOGASTRODUODENOSCOPY  03/11/2012   Procedure: ESOPHAGOGASTRODUODENOSCOPY (EGD);  Surgeon: Lafayette Dragon, MD;  Location: [facility] ENDOSCOPY;  Service: Endoscopy;  Laterality: N/A;  . ESOPHAGOGASTRODUODENOSCOPY N/A 03/07/2014   Procedure: ESOPHAGOGASTRODUODENOSCOPY (EGD);  Surgeon: Gatha Mayer, MD;  Location: [facility] ENDOSCOPY;  Service: Endoscopy;  Laterality: N/A;  . LUNG BIOPSY  02/2012  . TOTAL HIP ARTHROPLASTY Left 12/14/2015   Procedure: LEFT TOTAL HIP ARTHROPLASTY ANTERIOR APPROACH;  Surgeon: Mcarthur Rossetti, MD;  Location: [facility] ORS;  Service: Orthopedics;  Laterality: Left;  . TOTAL HIP ARTHROPLASTY Right 04/04/2016   Procedure: RIGHT TOTAL HIP ARTHROPLASTY ANTERIOR APPROACH;  Surgeon: Mcarthur Rossetti, MD;  Location: [facility] ORS;  Service: Orthopedics;  Laterality:  Right;   Family History:  Family History  Problem Relation Age of Onset  . Heart disease Father        Vague not clearly cardiac   Family Psychiatric  History: Denies Social History:  Social History    Substance and Sexual Activity  Alcohol Use No  . Alcohol/week: 0.0 standard drinks   Comment: socially     Social History   Substance and Sexual Activity  Drug Use No    Social History   Socioeconomic History  . Marital status: Divorced    Spouse name: Not on file  . Number of children: 0  . Years of education: college  . Highest education level: Not on file  Occupational History  . Not on file  Tobacco Use  . Smoking status: Never Smoker  . Smokeless tobacco: Never Used  Substance and Sexual Activity  . Alcohol use: No    Alcohol/week: 0.0 standard drinks    Comment: socially  . Drug use: No  . Sexual activity: Yes    Partners: Male    Birth control/protection: None    Comment: pt. given condoms 11/2019  Other Topics Concern  . 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     Drinks about 1 soda a day    Social Determinants of Health   Financial Resource Strain:   . Difficulty of Paying Living Expenses: Not on file  Food Insecurity:   . Worried About Charity fundraiser in the Last Year: Not on file  . Ran Out of Food in the Last Year: Not on file  Transportation Needs:   . Lack of Transportation (Medical): Not on file  . Lack of Transportation (Non-Medical): Not on file  Physical Activity:   . Days of Exercise per Week: Not on file  . Minutes of Exercise per Session: Not on file  Stress:   . Feeling of Stress : Not on file  Social Connections:   . Frequency of Communication with Friends and Family: Not on file  . Frequency of Social Gatherings with Friends and Family: Not on file  . Attends Religious Services: Not on file  . Active Member of Clubs or Organizations: Not on file  . Attends Archivist Meetings: Not on file  . Marital Status: Not on file   Additional Social History:    Allergies:   Allergies  Allergen Reactions  . Hydrocodone Itching and Nausea Only    Tolerates Oxycodone  . Tramadol  Itching and Nausea Only    Tolerates oxycodone    Labs:  Results for orders placed or performed during the hospital encounter of 02/04/20 (from the past 48 hour(s))  Protime-INR     Status: None   Collection Time: 02/08/20  4:39 AM  Result Value Ref Range   Prothrombin Time 12.6 11.4 - 15.2 seconds   INR 1.0 0.8 - 1.2    Comment: (NOTE) INR goal varies based on device and disease states. Performed at [facility], [place] [address] [place], [place] [id number]   Basic metabolic panel     Status: Abnormal   Collection Time: 02/08/20  4:39 AM  Result Value Ref Range   Sodium 137 135 - 145 mmol/L   Potassium 4.3 3.5 - 5.1 mmol/L   Chloride 108 98 - 111 mmol/L   CO2 19 (L) 22 - 32 mmol/L   Glucose, Bld 87 70 - 99 mg/dL  Comment: Glucose reference range applies only to samples taken after fasting for at least 8 hours.   BUN 9 6 - 20 mg/dL   Creatinine, Ser 0.90 0.44 - 1.00 mg/dL   Calcium 8.8 (L) 8.9 - 10.3 mg/dL   GFR calc non Af Amer >60 >60 mL/min   GFR calc Af Amer >60 >60 mL/min   Anion gap 10 5 - 15    Comment: Performed at [facility] [address] [place], [place] [address]  CBC     Status: Abnormal   Collection Time: 02/08/20  4:39 AM  Result Value Ref Range   WBC 3.2 (L) 4.0 - 10.5 K/uL   RBC 3.76 (L) 3.87 - 5.11 MIL/uL   Hemoglobin 11.6 (L) 12.0 - 15.0 g/dL   HCT 36.0 36.0 - 46.0 %   MCV 95.7 80.0 - 100.0 fL   MCH 30.9 26.0 - 34.0 pg   MCHC 32.2 30.0 - 36.0 g/dL   RDW 13.7 11.5 - 15.5 %   Platelets 170 150 - 400 K/uL   nRBC 0.0 0.0 - 0.2 %    Comment: Performed at [facility], [place] [address] [place], [place] [address]  CBC     Status: Abnormal   Collection Time: 02/09/20  8:27 AM  Result Value Ref Range   WBC 3.3 (L) 4.0 - 10.5 K/uL   RBC 3.92 3.87 - 5.11 MIL/uL   Hemoglobin 12.2 12.0 - 15.0 g/dL   HCT 37.6 36.0 - 46.0 %   MCV 95.9 80.0 - 100.0 fL   MCH 31.1 26.0 - 34.0 pg   MCHC 32.4 30.0 - 36.0 g/dL   RDW 13.5 11.5 - 15.5 %    Platelets 167 150 - 400 K/uL   nRBC 0.0 0.0 - 0.2 %    Comment: Performed at [facility], [place] [address] [place], [place] [id number]  Basic metabolic panel     Status: None   Collection Time: 02/09/20  8:27 AM  Result Value Ref Range   Sodium 138 135 - 145 mmol/L   Potassium 4.0 3.5 - 5.1 mmol/L   Chloride 106 98 - 111 mmol/L   CO2 23 22 - 32 mmol/L   Glucose, Bld 88 70 - 99 mg/dL    Comment: Glucose reference range applies only to samples taken after fasting for at least 8 hours.   BUN 8 6 - 20 mg/dL   Creatinine, Ser 0.88 0.44 - 1.00 mg/dL   Calcium 8.9 8.9 - 10.3 mg/dL   GFR calc non Af Amer >60 >60 mL/min   GFR calc Af Amer >60 >60 mL/min   Anion gap 9 5 - 15    Comment: Performed at [facility] [address] [place], [place] [address]    Medications:  Current Facility-Administered Medications  Medication Dose Route Frequency Provider Last Rate Last Admin  . acetaminophen (TYLENOL) tablet 650 mg  650 mg Oral Q6H PRN Bonnita Hollow, MD   650 mg at 02/04/20 2127  . amitriptyline (ELAVIL) tablet 25 mg  25 mg Oral QHS Bonnita Hollow, MD   25 mg at 02/08/20 2142  . azithromycin (ZITHROMAX) 1,200 mg in dextrose 5 % 250 mL IVPB  1,200 mg Intravenous Weekly Carlyle Basques, MD 250 mL/hr at 02/08/20 1131 1,200 mg at 02/08/20 1131  . enoxaparin (LOVENOX) injection 40 mg  40 mg Subcutaneous Q24H [place] Callas, NP   40 mg at 02/08/20 1325  .  hydrocortisone (ANUSOL-HC) suppository 25 mg  25 mg Rectal BID PRN Bonnita Hollow, MD      . hydrocortisone 1 % ointment 1 application  1 application Topical BID PRN Bonnita Hollow, MD      . lidocaine (LIDODERM) 5 % 1 patch  1 patch Transdermal Daily PRN Bonnita Hollow, MD      . naproxen (NAPROSYN) tablet 250 mg  250 mg Oral Q8H PRN Bonnita Hollow, MD   250 mg at 02/07/20 1442  . ondansetron (ZOFRAN) tablet 4 mg  4 mg Oral Q8H PRN Bonnita Hollow, MD      . Pyrimethamine-Leucovorin 50-20 MG CAPS 50 mg  50 mg  Oral Daily Hensel, Jamal Collin, MD       And  . Pyrimethamine-Leucovorin 25-10 MG CAPS 25 mg  25 mg Oral Q2000 Hensel, William A, MD      . sulfaDIAZINE tablet 1,500 mg  1,500 mg Oral QID [place] Callas, NP   Stopped at 02/09/20 [id number]    Musculoskeletal: Strength & Muscle Tone: within normal limits Gait & Station: normal Patient leans: N/A  Psychiatric Specialty Exam: Physical Exam  Nursing note and vitals reviewed. Constitutional: She is oriented to person, place, and time. She appears well-developed.  HENT:  Head: Normocephalic.  Cardiovascular: Normal rate.  Respiratory: Effort normal.  Neurological: She is alert and oriented to person, place, and time.  Psychiatric: Her speech is normal and behavior is normal. Cognition and memory are normal. She expresses impulsivity. She exhibits a depressed mood. She expresses suicidal ideation. She expresses suicidal plans.    Review of Systems  Constitutional: Negative.   HENT: Negative.   Eyes: Negative.   Respiratory: Negative.   Cardiovascular: Negative.   Gastrointestinal: Negative.   Genitourinary: Negative.   Musculoskeletal: Negative.   Skin: Negative.   Neurological: Negative.   Psychiatric/Behavioral: Positive for sleep disturbance.    Blood pressure 100/66, pulse 76, temperature 97.9 F (36.6 C), temperature source Oral, resp. rate 18, height 5\' 5"  (1.651 m), weight 84 kg, last menstrual period 02/01/2020, SpO2 99 %.Body mass index is 30.8 kg/m.  General Appearance: Casual  Eye Contact:  Fair  Speech:  Clear and Coherent and Normal Rate  Volume:  Normal  Mood:  Depressed  Affect:  Tearful  Thought Process:  Coherent, Goal Directed and Descriptions of Associations: Intact  Orientation:  Full (Time, Place, and Person)  Thought Content:  Logical  Suicidal Thoughts:  Yes.  with intent/plan  Homicidal Thoughts:  No  Memory:  Immediate;   Good Recent;   Good Remote;   Good  Judgement:  Fair  Insight:  Fair   Psychomotor Activity:  Normal  Concentration:  Concentration: Good and Attention Span: Good  Recall:  Good  Fund of Knowledge:  Good  Language:  Good  Akathisia:  No  Handed:  Right  AIMS (if indicated):     Assets:  Communication Skills Desire for Improvement Financial Resources/Insurance Housing Intimacy Leisure Time Resilience Social Support  ADL's:  Intact  Cognition:  WNL  Sleep:        Treatment Plan Summary: Case discussed with Dr. Dwyane Dee. Inpatient trestment recommended once medically clear.   Disposition: Recommend psychiatric Inpatient admission when medically cleared. Supportive therapy provided about ongoing stressors. Discussed crisis plan, support from social network, calling 911, coming to the Emergency Department, and calling Suicide Hotline.  This service was provided via telemedicine using a 2-way, interactive audio and video technology.  Names of all  persons participating in this telemedicine service and their role in this encounter. Name: Kelsey Rodriguez Role: Patient  Name: Letitia Libra Role: [place], [place] 02/09/2020 11:03 AM

## 2020-02-09 NOTE — Social Work (Addendum)
CSW contacted about inpatient psychiatry recommending [facility].  Given documentation from ID regarding treatment and monitoring here requested that we get clearance from both attending team and ID. Will make [facility] referral for them to follow.   Westley Hummer, MSW, [place] Work

## 2020-02-10 LAB — BASIC METABOLIC PANEL
Anion gap: 8 (ref 5–15)
BUN: 12 mg/dL (ref 6–20)
CO2: 22 mmol/L (ref 22–32)
Calcium: 8.9 mg/dL (ref 8.9–10.3)
Chloride: 108 mmol/L (ref 98–111)
Creatinine, Ser: 1.01 mg/dL — ABNORMAL HIGH (ref 0.44–1.00)
GFR calc Af Amer: >60 mL/min (ref >60)
GFR calc non Af Amer: >60 mL/min (ref >60)
Glucose, Bld: 93 mg/dL (ref 70–99)
Potassium: 4.7 mmol/L (ref 3.5–5.1)
Sodium: 138 mmol/L (ref 135–145)

## 2020-02-10 LAB — CBC
HCT: 36.7 % (ref 36.0–46.0)
Hemoglobin: 12 g/dL (ref 12.0–15.0)
MCH: 31.3 pg (ref 26.0–34.0)
MCHC: 32.7 g/dL (ref 30.0–36.0)
MCV: 95.6 fL (ref 80.0–100.0)
Platelets: 156 10*3/uL (ref 150–400)
RBC: 3.84 MIL/uL — ABNORMAL LOW (ref 3.87–5.11)
RDW: 13.5 % (ref 11.5–15.5)
WBC: 3 10*3/uL — ABNORMAL LOW (ref 4.0–10.5)
nRBC: 0 % (ref 0.0–0.2)

## 2020-02-10 MED ORDER — SULFAMETHOXAZOLE-TRIMETHOPRIM 400-80 MG PO TABS
1.0000 | ORAL_TABLET | Freq: Every day | ORAL | Status: DC
  Administered 2020-02-10 – 2020-02-12 (×3): 1 via ORAL
  Filled 2020-02-10 (×4): qty 1

## 2020-02-10 NOTE — Progress Notes (Addendum)
Family Medicine Teaching Service Daily Progress Note Intern Pager: [phone]  Patient name: Kelsey Rodriguez Medical record number: [id number] Date of birth: 11-15-1991 Age: 29 y.o. Gender: female  Primary Care Provider: Gerlene Fee, DO Consultants: ID Code Status: FULL  Pt Overview and Major Events to Date:  2/20 Admitted, ID consulted   Assessment and Plan: Kelsey Rodriguez is a 29 y.o. female presenting with AoC HA w/ N/V . 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  #Acute on chronic headache likely secondary to prior CNS lesion without mass effect No longer endorsing headaches. MRI shows mild reduction in surrounding inflammation, stable mass with ring-enhancing lesion. No neurologic deficits. T -ID consulted, appreciate recommendations: ABX x2 weeks, repeat MRI in 2-3 weeks -IV Clindamycin 600mg  q6h -PO Pyrimethamine 200 mg X 1 Then 75 mg daily -PO Leucovorin 30 mg daily -IV Azithromycin 1200mg  weekly  -PO Bactrim 400-80mg  QD -Amitriptyline 25 mg  -Tylenol, naproxen as needed   HIV  AIDS Absolute lymphocytes count 1190.  CD4 count was 36 on 11/2019. Currently on ART.  Endorses compliance. 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 CD 4 <35. Lymphocyte count 847. Viral load 131K. -ID recommendations appreciated: hold home ART d/t IRIS -Abx as above  Chronic MSK pain Does not currently endorse any pain at this time. -Multimodality pain regimen including Tylenol, naproxen, lidocaine, K pad as needed -PT evaluation  Moderate Depression w/ suicidal ideation PHQ9 score of 13. Moderate depression  with several days of suicidal ideation with previous attempt. Patient prefers outpatient treatment. -Psych consult: inpatient psych recommended will need reevaluation  FEN/GI:Regular diet Prophylaxis:lovenox  Disposition: Inpatient psych when consistently tolerating PO treatment for toxoplasmosis  Subjective:  No concerns at this time. Would prefer outpatient psychiatric treatment over inpatient.  Objective: Temp:  [97.7 F (36.5 C)-98.3 F (36.8 C)] 97.7 F (36.5 C) (02/26 0511) Pulse Rate:  [73-91] 73 (02/26 0511) Resp:  [18-19] 18 (02/26 0511) BP: (95-125)/(58-85) 100/58 (02/26 0511) SpO2:  [99 %-100 %] 100 % (02/26 0511) Weight:  [83.8 kg] 83.8 kg (02/26 0511)  Physical Exam:  General: Appears well, no acute distress. Age appropriate. Cardiac: RRR, normal heart sounds, no murmurs Respiratory: CTAB, normal effort. Skin: Warm and dry, no rashes noted Neuro: alert and oriented, no focal deficits Psych: flat affect  Laboratory: Recent Labs  Lab 02/08/20 0439 02/09/20 0827 02/10/20 0547  WBC 3.2* 3.3* 3.0*  HGB 11.6* 12.2 12.0  HCT 36.0 37.6 36.7  PLT 170 167 156   Recent Labs  Lab 02/04/20 1511 02/05/20 0513 02/08/20 0439 02/09/20 0827 02/10/20 0547  NA 139   < > 137 138 138  K 3.6   < > 4.3 4.0 4.7  CL 106   < > 108 106 108  CO2 23   < > 19* 23 22  BUN 14   < > 9 8 12   CREATININE 0.82   < > 0.90 0.88 1.01*  CALCIUM 9.2   < > 8.8* 8.9 8.9  PROT 8.3*  --   --   --   --   BILITOT 0.4  --   --   --   --   ALKPHOS 81  --   --   --   --  ALT 20  --   --   --   --   AST 21  --   --   --   --   GLUCOSE 81   < > 87 88 93   < > = values in this interval not displayed.   Imaging/Diagnostic Tests: Now new imaging.  Gerlene Fee, DO 02/10/2020, 7:42 AM PGY-1, [place] Intern pager: [phone], text pages welcome

## 2020-02-10 NOTE — Progress Notes (Signed)
    [place] for Infectious Disease    Date of Admission:  02/04/2020      ID: Kelsey Rodriguez is a 29 y.o. female with   Principal Problem:   CNS toxoplasmosis, suspected  Active Problems:   HIV (human immunodeficiency virus infection) ([place])   AIDS (acquired immune deficiency syndrome) ([place])   Intractable headache   Intracranial mass    Subjective: Slept poorly. Tolerated iv abtx without abdominal pain. No diarrhea  Medications:  . amitriptyline  25 mg Oral QHS  . enoxaparin (LOVENOX) injection  40 mg Subcutaneous Q24H  . Pyrimethamine-Leucovorin  50 mg Oral Daily   And  . Pyrimethamine-Leucovorin  25 mg Oral Q2000  . sulfamethoxazole-trimethoprim  1 tablet Oral Daily    Objective: Vital signs in last 24 hours: Temp:  [97.7 F (36.5 C)-97.9 F (36.6 C)] 97.9 F (36.6 C) (02/26 1225) Pulse Rate:  [73-90] 90 (02/26 1225) Resp:  [18] 18 (02/26 1225) BP: (95-111)/(58-70) 111/70 (02/26 1225) SpO2:  [99 %-100 %] 100 % (02/26 1225) Weight:  [83.8 kg] 83.8 kg (02/26 0511) Physical Exam  Constitutional:  oriented to person, place, and time. appears well-developed and well-nourished. No distress.  HENT: [place]/AT, PERRLA, no scleral icterus Mouth/Throat: Oropharynx is clear and moist. No oropharyngeal exudate.  Cardiovascular: Normal rate, regular rhythm and normal heart sounds. Exam reveals no gallop and no friction rub.  No murmur heard.  Pulmonary/Chest: Effort normal and breath sounds normal. No respiratory distress.  has no wheezes.  Neck = supple, no nuchal rigidity Abdominal: Soft. Bowel sounds are normal.  exhibits no distension. There is no tenderness.  Lymphadenopathy: no cervical adenopathy. No axillary adenopathy Neurological: alert and oriented to person, place, and time.  Skin: Skin is warm and dry. No rash noted. No erythema.  Psychiatric: a normal mood and affect.  behavior is normal.    Lab Results Recent Labs    02/09/20 0827 02/10/20 0547  WBC  3.3* 3.0*  HGB 12.2 12.0  HCT 37.6 36.7  NA 138 138  K 4.0 4.7  CL 106 108  CO2 23 22  BUN 8 12  CREATININE 0.88 1.01*    Microbiology: No new data Studies/Results: No results found.   Assessment/Plan: Cns toxo = continue with clindamycin iv, pyrimethamine and leucovorin  oi proph = continue with bactrim ds daily plus azithromycin iv 1200mg  weekly  hiv diseae= holding ART for now  Suicide ideation = has sitter. Qualifies for inpatient psych however patient not interested at this time.  [facility]nie [facility] for Infectious Diseases Cell: [phone] Pager: [phone]  02/10/2020, 3:48 PM

## 2020-02-10 NOTE — Progress Notes (Signed)
PT Cancellation/Discharge Note  Patient Details Name: Kelsey Rodriguez MRN: [id number] DOB: 18-Nov-1991   Cancelled Treatment:    Reason Eval/Treat Not Completed: Other (comment).  I spoke with the pt at length today.  She is getting up and moving around the room independently, doing her x1 exercises on her own, and feeling better (not as slow, weak, or shaky on her feet).  All further PT needs can be met in the outpatient setting should she be agreeable to outpatient vestibular therapy at discharge.  PT to sign off as she has no further acute therapy needs.    Thanks,  Verdene Lennert, PT, DPT  Acute Rehabilitation [phone] pager [phone] office  @ [facility]: [phone]     Harvie Heck 02/10/2020, 1:43 PM

## 2020-02-10 NOTE — Social Work (Signed)
Aware that ID team would like pt to have continued monitoring here until she is tolerating PO regimine. When pt cleared by both attending and ID teams will need new psychiatric assessment and then can re-refer to [facility] at that time.   Westley Hummer, MSW, [place] Work

## 2020-02-10 NOTE — Progress Notes (Signed)
Suicide sitter order received from MD and Suicide precaution initiated. Sitter at bedside in room with pt Pt informed of the reason for the sitter and she voices understanding. Reported off to oncoming RN. Delia Heady RN

## 2020-02-11 LAB — BASIC METABOLIC PANEL
Anion gap: 9 (ref 5–15)
BUN: 10 mg/dL (ref 6–20)
CO2: 20 mmol/L — ABNORMAL LOW (ref 22–32)
Calcium: 9.3 mg/dL (ref 8.9–10.3)
Chloride: 106 mmol/L (ref 98–111)
Creatinine, Ser: 0.83 mg/dL (ref 0.44–1.00)
GFR calc Af Amer: >60 mL/min (ref >60)
GFR calc non Af Amer: >60 mL/min (ref >60)
Glucose, Bld: 94 mg/dL (ref 70–99)
Potassium: 4.1 mmol/L (ref 3.5–5.1)
Sodium: 135 mmol/L (ref 135–145)

## 2020-02-11 NOTE — Progress Notes (Signed)
Family Medicine Teaching Service Daily Progress Note Intern Pager: [phone]  Patient name: Kelsey Rodriguez Medical record number: [id number] Date of birth: January 27, 1991 Age: 29 y.o. Gender: female  Primary Care Provider: Gerlene Fee, DO Consultants: ID Code Status: FULL  Pt Overview and Major Events to Date:  2/20 Admitted, ID consulted   Assessment and Plan: Kelsey Rodriguez is a 29 y.o. female presenting with AoC HA w/ N/V . 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  #Acute on chronic headache likely secondary to prior CNS lesion without mass effect No longer endorsing headaches. MRI shows mild reduction in surrounding inflammation, stable mass with ring-enhancing lesion. No neurologic deficits. T -ID consulted, appreciate recommendations: ABX x2 weeks, repeat MRI in 2-3 weeks -IV Clindamycin 600mg  q6h -PO Pyrimethamine 200 mg X 1 Then 75 mg daily -PO Leucovorin 30 mg daily -IV Azithromycin 1200mg  weekly  -PO Bactrim 400-80mg  QD -Amitriptyline 25 mg  -Tylenol, naproxen as needed   HIV  AIDS Absolute lymphocytes count 1190.  CD4 count was 36 on 11/2019. Currently on ART.  Endorses compliance. 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 CD 4 <35. Lymphocyte count 847. Viral load 131K. -ID recommendations appreciated: hold home ART d/t IRIS -Abx as above  Chronic MSK pain Does not currently endorse any pain at this time. -Multimodality pain regimen including Tylenol, naproxen, lidocaine, K pad as needed -PT evaluation  Moderate Depression w/ suicidal ideation PHQ9 score of 13. Moderate depression  with several days of suicidal ideation with previous attempt. Patient prefers outpatient treatment. -Psych consult: inpatient psych recommended will need reevaluation likely 3/1  FEN/GI:Regular diet Prophylaxis:lovenox  Disposition: Pending psych re-evaluation likely 3/1  Subjective:  Says that she is feeling better today and when she spoke with the psychiatrist she was explaining her intermittent depression when she was in a really tough space. She does not feel this way everyday but several days. She would like to be reevaluated and thinks she would benefit best from outpatient psychiatric treatment.   Objective: Temp:  [97.9 F (36.6 C)-98.2 F (36.8 C)] 97.9 F (36.6 C) (02/27 0554) Pulse Rate:  [79-95] 79 (02/27 0554) Resp:  [16-18] 16 (02/27 0554) BP: (111-133)/(69-90) 113/86 (02/27 0554) SpO2:  [100 %] 100 % (02/27 0554) Weight:  [83.2 kg] 83.2 kg (02/27 0500)  Physical Exam:  General: Appears well, no acute distress. Age appropriate. Sitting up in bed with sitter at bedside. Cardiac: RRR, normal heart sounds, no murmurs Respiratory: CTAB, normal effort Extremities: No edema or cyanosis. Skin: Warm and dry, no rashes noted Neuro: alert and oriented, no focal deficits Psych: normal affect  Laboratory: Recent Labs  Lab 02/08/20 0439 02/09/20 0827 02/10/20 0547  WBC 3.2* 3.3* 3.0*  HGB 11.6* 12.2 12.0  HCT 36.0 37.6 36.7  PLT 170 167 156   Recent Labs  Lab 02/04/20 1511 02/05/20 0513 02/09/20 0827 02/10/20 0547 02/11/20 0428  NA 139   < > 138 138 135  K 3.6   < > 4.0 4.7 4.1  CL 106   < > 106 108 106  CO2 23   < > 23 22 20*  BUN 14   < > 8 12 10  CREATININE 0.82   < > 0.88 1.01* 0.83  CALCIUM 9.2   < > 8.9 8.9 9.3  PROT 8.3*  --   --   --   --   BILITOT 0.4  --   --   --   --   ALKPHOS 81  --   --   --   --   ALT 20  --   --   --   --   AST 21  --   --   --   --   GLUCOSE 81   < > 88 93 94   < > = values in this interval not displayed.    Imaging/Diagnostic Tests: Now new imaging.  Gerlene Fee, DO 02/11/2020, 7:57 AM PGY-1, [place] Intern pager: [phone], text pages welcome

## 2020-02-12 LAB — CBC
HCT: 38.4 % (ref 36.0–46.0)
Hemoglobin: 12.4 g/dL (ref 12.0–15.0)
MCH: 30.9 pg (ref 26.0–34.0)
MCHC: 32.3 g/dL (ref 30.0–36.0)
MCV: 95.8 fL (ref 80.0–100.0)
Platelets: 159 10*3/uL (ref 150–400)
RBC: 4.01 MIL/uL (ref 3.87–5.11)
RDW: 13.4 % (ref 11.5–15.5)
WBC: 3 10*3/uL — ABNORMAL LOW (ref 4.0–10.5)
nRBC: 0 % (ref 0.0–0.2)

## 2020-02-12 LAB — COMPREHENSIVE METABOLIC PANEL
ALT: 22 U/L (ref 0–44)
AST: 23 U/L (ref 15–41)
Albumin: 3.8 g/dL (ref 3.5–5.0)
Alkaline Phosphatase: 78 U/L (ref 38–126)
Anion gap: 10 (ref 5–15)
BUN: 8 mg/dL (ref 6–20)
CO2: 19 mmol/L — ABNORMAL LOW (ref 22–32)
Calcium: 9.4 mg/dL (ref 8.9–10.3)
Chloride: 107 mmol/L (ref 98–111)
Creatinine, Ser: 1.03 mg/dL — ABNORMAL HIGH (ref 0.44–1.00)
GFR calc Af Amer: >60 mL/min (ref >60)
GFR calc non Af Amer: >60 mL/min (ref >60)
Glucose, Bld: 89 mg/dL (ref 70–99)
Potassium: 4.3 mmol/L (ref 3.5–5.1)
Sodium: 136 mmol/L (ref 135–145)
Total Bilirubin: 0.7 mg/dL (ref 0.3–1.2)
Total Protein: 8 g/dL (ref 6.5–8.1)

## 2020-02-12 MED ORDER — POLYETHYLENE GLYCOL 3350 17 G PO PACK
17.0000 g | PACK | Freq: Once | ORAL | Status: AC
  Administered 2020-02-12: 11:00:00 17 g via ORAL
  Filled 2020-02-12: qty 1

## 2020-02-12 MED ORDER — SODIUM CHLORIDE 0.9% FLUSH: 10.0000 mL | INTRAVENOUS | Status: DC | PRN

## 2020-02-12 NOTE — Progress Notes (Addendum)
Family Medicine Teaching Service Daily Progress Note Intern Pager: [phone]  Patient name: Kelsey Rodriguez: [id number] Date of birth: [birth year] Age: 29 y.o. Gender: female  Primary Care Provider: Gerlene Fee, DO Consultants: ID Code Status: FULL  Pt Overview and Major Events to Date:  2/20 Admitted, ID consulted   Assessment and Plan: Kelsey Rodriguez is a 29 y.o. female presenting with acute-on-chronic HA w/ N/V. 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  #Acute on chronic headache likely secondary to prior toxoplasmosis CNS lesion w/o mass effect, improving Today no longer endorsing headaches. MRI showed mild reduction in surrounding inflammation, stable mass with ring-enhancing lesion. No neurologic deficits.  -ID consulted, appreciate recommendations: ABX x2 weeks, repeat MRI in 2-3 weeks -IV Clindamycin 600mg  q6h (started 02/25-) -PO Pyrimethamine 200 mg X 1 Then 75 mg daily (started 2/25-) -PO Leucovorin 30 mg daily (started 2/25-) -IV Azithromycin 1200mg  weekly (last had 02/24, next dose March 3) -PO Bactrim 400-80mg  QD (2/26-) -Amitriptyline 25 mg  -Tylenol, naproxen as needed   HIV  AIDS, poorly controlled, unchanged Absolute lymphocytes count 1190.  CD4 count was 36 on 11/2019. Currently on ART.  Endorses compliance. 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 CD4 count <35. Lymphocyte count 847. Viral load 131K. 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 -Multimodality pain regimen including Tylenol, naproxen, lidocaine, K pad as needed -PT evaluation  Moderate Depression w/ suicidal ideation PHQ9 score of 13. Moderate depression with several days of suicidal ideation with previous attempt. Patient prefers outpatient treatment. -Psych consult: inpatient psych recommended will need reevaluation likely 3/1  FEN/GI:Regular diet Prophylaxis:lovenox  Disposition: Pending psych re-evaluation likely 3/1  Subjective:  Says she feels better this morning, denies headaches. No other concerns at this time.  Objective: Temp:  [97.9 F (36.6 C)-98.2 F (36.8 C)] 98 F (36.7 C) (02/27 2230) Pulse Rate:  [79-109] 90 (02/27 2230) Resp:  [16-20] 20 (02/27 2230) BP: (109-131)/(79-91) 109/83 (02/27 2230) SpO2:  [97 %-100 %] 100 % (02/27 2230) Weight:  [84.3 kg] 84.3 kg (02/28 0500)  Physical Exam: General: NAD, nontoxic appearing, pleasant patient Cardiac: RRR, S1S2 present, no murmurs Respiratory: CTAB, speaking in full sentences Extremities: No edema or cyanosis. Skin: Warm and dry, no rashes noted Neuro: alert and oriented, no focal deficits Psych: normal affect  Laboratory: Recent Labs  Lab 02/08/20 0439 02/09/20 0827 02/10/20 0547  WBC 3.2* 3.3* 3.0*  HGB 11.6* 12.2 12.0  HCT 36.0 37.6 36.7  PLT 170 167 156   Recent Labs  Lab 02/09/20 0827 02/10/20 0547 02/11/20 0428  NA 138 138 135  K 4.0 4.7 4.1  CL 106 108 106  CO2 23 22 20*  BUN 8 12 10   CREATININE 0.88 1.01* 0.83  CALCIUM 8.9 8.9 9.3  GLUCOSE 88 93 94   Imaging/Diagnostic Tests: Now new imaging.   Daisy Floro, DO 02/12/2020, 5:46 AM PGY-2, [place] Intern pager: [phone], text pages welcome

## 2020-02-13 ENCOUNTER — Inpatient Hospital Stay (HOSPITAL_COMMUNITY): Payer: Medicaid Other

## 2020-02-13 DIAGNOSIS — R079 Chest pain, unspecified: Secondary | ICD-10-CM

## 2020-02-13 LAB — GLUCOSE, CAPILLARY: Glucose-Capillary: 103 mg/dL — ABNORMAL HIGH (ref 70–99)

## 2020-02-13 LAB — TROPONIN I (HIGH SENSITIVITY)
Troponin I (High Sensitivity): 10 ng/L (ref <18)
Troponin I (High Sensitivity): 7 ng/L (ref <18)

## 2020-02-13 MED ORDER — SULFAMETHOXAZOLE-TRIMETHOPRIM 400-80 MG PO TABS
1.0000 | ORAL_TABLET | Freq: Every day | ORAL | Status: DC
  Administered 2020-02-14 – 2020-02-20 (×7): 1 via ORAL
  Filled 2020-02-13 (×7): qty 1

## 2020-02-13 MED ORDER — LEVETIRACETAM IN NACL 1500 MG/100ML IV SOLN
1500.0000 mg | Freq: Once | INTRAVENOUS | Status: AC
  Administered 2020-02-13: 1500 mg via INTRAVENOUS
  Filled 2020-02-13 (×2): qty 100

## 2020-02-13 MED ORDER — POLYETHYLENE GLYCOL 3350 17 G PO PACK: 17.0000 g | PACK | Freq: Every day | ORAL | Status: DC | PRN

## 2020-02-13 MED ORDER — LEVETIRACETAM 500 MG PO TABS
500.0000 mg | ORAL_TABLET | Freq: Two times a day (BID) | ORAL | Status: DC
  Administered 2020-02-13 – 2020-02-20 (×14): 500 mg via ORAL
  Filled 2020-02-13 (×14): qty 1

## 2020-02-13 MED ORDER — SULFAMETHOXAZOLE-TRIMETHOPRIM 400-80 MG PO TABS
2.0000 | ORAL_TABLET | Freq: Every day | ORAL | Status: DC
  Administered 2020-02-13: 2 via ORAL
  Filled 2020-02-13: qty 2

## 2020-02-13 NOTE — Progress Notes (Signed)
Family Medicine Teaching Service Daily Progress Note Intern Pager: [phone]  Patient name: Kelsey Rodriguez Medical record number: [id number] Date of birth: 15-Mar-1991 Age: 29 y.o. Gender: female  Primary Care Provider: Gerlene Fee, DO Consultants: ID Code Status: FULL  Pt Overview and Major Events to Date:  2/20 Admitted, ID consulted   Assessment and Plan: Kelsey Rodriguez is a 29 y.o. female presenting with acute-on-chronic HA w/ N/V. 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  #Acute on chronic headache likely secondary to prior toxoplasmosis CNS lesion w/o mass effect, improving Reports no headaches over the last 5 days.  MRI showed mild reduction in surrounding inflammation, stable mass with ring-enhancing lesion. No neurologic deficits.  -ID consulted, appreciate recommendations: ABX x2 weeks, repeat MRI in 2-3 weeks -IV Clindamycin 600mg  q6h (started 02/25-) -PO Pyrimethamine 200 mg X 1 Then 75 mg daily (started 2/25-) -PO Leucovorin 30 mg daily (started 2/25-) -IV Azithromycin 1200mg  weekly (last had 02/24, next dose March 3) -PO Bactrim 400-80mg  QD (2/26-) -Amitriptyline 25 mg  -Tylenol, naproxen as needed   HIV  AIDS, poorly controlled, unchanged Absolute lymphocytes count 1190.  CD4 count was 36 on 11/2019. Currently on ART.  Endorses compliance. 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 CD4 count <35. Lymphocyte count 847. Viral load 131K. 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 -Multimodality pain regimen including Tylenol, naproxen, lidocaine, K pad as needed -PT evaluation  Moderate Depression w/ suicidal ideation PHQ9 score of 13. Moderate depression with several days of suicidal ideation with previous attempt. Patient prefers outpatient treatment. -Psych consult: inpatient psych recommended will need reevaluation -Reevaluation today 3/1  FEN/GI:Regular diet, patient complaining of constipation so MiraLAX x1 was given Prophylaxis:lovenox  Disposition: Pending psych re-evaluation likely 3/1  Subjective:  Patient is doing well this morning.  She denies any headaches and is in good spirits.  Reports that she was upset when she was evaluated by psych because she kept getting sick from her medications.  Denies any SI or HI at this time.  Is interested in home IV antibiotics if possible.  Objective: Temp:  [97.9 F (36.6 C)-98.5 F (36.9 C)] 97.9 F (36.6 C) (02/28 2337) Pulse Rate:  [86-110] 93 (02/28 2337) Resp:  [18-20] 18 (02/28 1754) BP: (104-121)/(72-96) 104/72 (02/28 2337) SpO2:  [98 %-100 %] 99 % (02/28 2337)  Physical Exam: General: No acute distress, sitting comfortably in bed with sitter. Cardiac: Regular rate and rhythm, no murmurs appreciated Respiratory: Normal work of breathing, clear to auscultation bilaterally Extremities: No edema or cyanosis. Skin: Warm and dry, no rashes noted Neuro: alert and oriented, no focal deficits Psych: normal affect, denies any SI or HI at this time  Laboratory: Recent Labs  Lab 02/09/20 0827 02/10/20 0547 02/12/20 0439  WBC 3.3* 3.0* 3.0*  HGB 12.2 12.0 12.4  HCT 37.6 36.7 38.4  PLT 167 156 159   Recent Labs  Lab 02/10/20 0547 02/11/20 0428 02/12/20 0439  NA 138 135 136  K 4.7 4.1 4.3  CL 108 106 107  CO2 22 20* 19*  BUN 12  10 8  CREATININE 1.01* 0.83 1.03*  CALCIUM 8.9 9.3 9.4  PROT  --   --  8.0  BILITOT  --   --  0.7  ALKPHOS  --   --  78  ALT  --   --  22  AST  --   --   23  GLUCOSE 93 94 89   Imaging/Diagnostic Tests: Now new imaging.   Gifford Shave, MD 02/13/2020, 5:32 AM PGY-1, [place] Intern pager: [phone], text pages welcome

## 2020-02-13 NOTE — Significant Event (Addendum)
Rapid Response Event Note  Overview: Time Called: 1330 Arrival Time: 1333 Event Type: Other (Comment) Pt had seizure witnessed by sitter at bedside, pt assisted to floor. Staff members assisted pt back to bed. BP 189/102, HR 131, RR 24, SpO2 100% on room air  Initial Focused Assessment: Pt lying in bed, awake. Pt drowsy and intermittently following commands. Pt able to tell us her name, is oriented to place, disoriented to situation and time. Pt able to move all extremities, no drift, no weakness. Lung sounds are clear. Abdomen is soft. Pt complains of pain in her chest when she breaths in deep. Pt complains of pain in her neck and back. Pt's chart does not reflect a history of seizures and she does not take medication for seizures. Pt having new onset tachycardia, HR 130-145 bpm.   Interventions: -CBG: 103 -EKG completed. -Cardiac monitoring -CT head ordered -Keppra  Plan of Care (if not transferred): -Transfer order written for progressive care  -Seizure precautions  Event Summary: Name of Physician Notified: Dr. Caron Presume at 1330    at    Outcome: Transferred (Comment)  Event End Time: [place]

## 2020-02-13 NOTE — Procedures (Addendum)
Patient Name: Markan Kilmer  MRN: [id number]  Epilepsy Attending: Lora Havens  Referring Physician/Provider: Dr Charmayne Sheer Date: 02/13/2020 Duration: 23.37 mins  Patient history: 29 year old female with HIV/AIDS with large right hemispheric CNS lesion-toxoplasmosis versus lymphoma. EEG to evaluate for seizure  Level of alertness: awake, asleep  AEDs during EEG study: LEV  Technical aspects: This EEG study was done with scalp electrodes positioned according to the 10-20 International system of electrode placement. Electrical activity was acquired at a sampling rate of 500Hz  and reviewed with a high frequency filter of 70Hz  and a low frequency filter of 1Hz . EEG data were recorded continuously and digitally stored.   DESCRIPTION: During awake state, no clear posterior dominant rhythm was seen. Sleep was characterized by vertex waves, sleep spindles (12-14Hz ), maximal frontocentral. There is also continuous generalized 3-5hz  theta-delta slowing in right temporal region.  Hyperventilation and photic stimulation were not performed.  ABNORMALITY - Continuous slow, right temporal region  IMPRESSION: This study is suggestive of cortical dysfunction in right temporal region likely secondary to underlying lesion. No seizures or epileptiform discharges were seen throughout the recording.  Ashiya Kinkead Barbra Sarks

## 2020-02-13 NOTE — Progress Notes (Addendum)
    [place] for Infectious Disease    Date of Admission:  02/04/2020      ID: Kelsey Rodriguez [name] is a 29 y.o. female with   Principal Problem:   CNS toxoplasmosis, suspected  Active Problems:   HIV (human immunodeficiency virus infection) ([place])   AIDS (acquired immune deficiency syndrome) ([place])   Intractable headache   Intracranial mass    Subjective: Feeling better. Has had no trouble with the IV medication for toxoplasmosis infection.  No diarrhea.  States that Psych cleared her for outpatient treatment and no need for sitter anymore.  Reports that she has had no headache now for 3 days.    Medications:  . amitriptyline  25 mg Oral QHS  . enoxaparin (LOVENOX) injection  40 mg Subcutaneous Q24H  . Pyrimethamine-Leucovorin  50 mg Oral Daily   And  . Pyrimethamine-Leucovorin  25 mg Oral Q2000  . sulfamethoxazole-trimethoprim  2 tablet Oral Daily    Objective: Vital signs in last 24 hours: Temp:  [97.9 F (36.6 C)-98.5 F (36.9 C)] 98.2 F (36.8 C) (03/01 1203) Pulse Rate:  [87-131] 131 (03/01 1334) Resp:  [15-20] 20 (03/01 1334) BP: (104-189)/(67-102) 189/102 (03/01 1334) SpO2:  [98 %-100 %] 100 % (03/01 1334) Weight:  [82.4 kg] 82.4 kg (03/01 0500)   Physical Exam  Constitutional:  oriented to person, place, and time. appears well-developed and well-nourished. No distress.  HENT: [place]/AT, PERRLA, no scleral icterus Mouth/Throat: Oropharynx is clear and moist. No oropharyngeal exudate.  Cardiovascular: Normal rate, regular rhythm and normal heart sounds. Exam reveals no gallop and no friction rub. No murmur heard.  Pulmonary/Chest: Effort normal and breath sounds normal. No respiratory distress.  No wheezes.  Neck = supple, no nuchal rigidity Abdominal: Soft. Bowel sounds are normal.  exhibits no distension. There is no tenderness.  Lymphadenopathy: no cervical adenopathy. No axillary adenopathy Neurological: alert and oriented to person, place, and time.  Skin:  Skin is warm and dry. No rash noted. No erythema.  Psychiatric: a normal mood and affect.  behavior is normal.    Lab Results Recent Labs    02/11/20 0428 02/12/20 0439  WBC  --  3.0*  HGB  --  12.4  HCT  --  38.4  NA 135 136  K 4.1 4.3  CL 106 107  CO2 20* 19*  BUN 10 8  CREATININE 0.83 1.03*    Microbiology: No new data Studies/Results: No results found.   Assessment/Plan: CNS Toxoplasmosis = Encouraging her h/a's are improved and now resolved x 72h. Tried to help with understanding that taking the antimicrobials she is getting is the reason she is getting better. Will continue with clindamycin IV, pyrimethamine and leucovorin PO. Hopeful to transition to complete oral regimen the end of the week.   oi proph = continue with single strength bactrim daily plus azithromycin iv 1200mg  weekly  hiv diseae= holding ART for now  Suicide ideation/Major Depression = has sitter. Psychiatry re-evaluated today, recs pending but per her report likely outpatient management. Timing on appt TBD following discharge from current inpatient stay.    Janene Madeira, MSN, NP-C [facility] for Infectious Disease [email or web address] Pager: [phone] Office: [phone] [place]: [phone]

## 2020-02-13 NOTE — Progress Notes (Signed)
02/14/20 1330 Staff assist called to the room by sitter. Sitter witnessed patient acting odd. "Acting like she stubbed her toe," bending down. Patient was not responding verbally to sitter at this point and sitter helped patient to the floor. Primary nurse now in the room and patient was helped back into bed by 4 staff members. Patient breathing, eyes open and not responding to verbal responses, post ictal. VS obtained and provider notified. SBP 189/102, HR 131, RR 20s, 100% on RA. Provider to bedside for evaluation. Will continue to monitor.

## 2020-02-13 NOTE — Consult Note (Signed)
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 female patient.  Patient assessed by nurse practitioner.  Patient alert and oriented answers appropriately.  Patient states "I feel better the day that we talked before I was depressed about my health and I was frustrated about my medication." Patient denies suicidal and homicidal ideations.  Patient denies self-harm.  Patient reports 1 prior suicide attempt "a long time ago."  Patient denies auditory visual hallucinations.  Patient denies symptoms of paranoia. Patient reports she lives at home with her husband Barkley Boards.  Patient reports she is currently unemployed.  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  Patient gives verbal consent to reach out to her husband Jenner. Spoke with patient's husband Waldeck-patient's husband denies concerns for patient safety.  Patient's husband denies access to weapons.  Patient's husband states "she is really safe when she is at home."  HPI: Patient admitted with complaints of headaches  Past Psychiatric History: Denies  Risk to Self:  Denies Risk to Others:  Denies Prior Inpatient Therapy:  Denies Prior Outpatient Therapy:  Denies  Past Medical History:  Past Medical History:  Diagnosis Date  . 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 Adrenal insufficiency ([place])   . Anemia of chronic disease  03/11/2012  . Back pain of lumbar region with sciatica 02/12/2015  . Bell's palsy 08/26/2013  . 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 Chronic back pain   . Chronic leg pain    bilateral knees, ankles  . Fatigue   . 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 HIV (human immunodeficiency virus infection) ([place]) 02/2012  . Laceration of ankle, right 11/18/2012  . Lumbar radiculopathy   . Meningitis 02/18/2018  . Pelvic pain   . PID (acute pelvic inflammatory disease) 02/26/2018  . Reflux esophagitis 03/11/2012  . Tuberculosis   . 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 Vertigo     Past Surgical History:  Procedure Laterality Date  . APPENDECTOMY  ~ 2000  . DILATION AND CURETTAGE OF UTERUS  2008  . ESOPHAGOGASTRODUODENOSCOPY  03/11/2012   Procedure: ESOPHAGOGASTRODUODENOSCOPY (EGD);  Surgeon: Lafayette Dragon, MD;  Location: [facility] ENDOSCOPY;  Service: Endoscopy;  Laterality: N/A;  . ESOPHAGOGASTRODUODENOSCOPY N/A 03/07/2014   Procedure: ESOPHAGOGASTRODUODENOSCOPY (EGD);  Surgeon: Gatha Mayer, MD;  Location: [facility] ENDOSCOPY;  Service: Endoscopy;  Laterality: N/A;  . LUNG BIOPSY  02/2012  . TOTAL HIP ARTHROPLASTY Left 12/14/2015   Procedure: LEFT TOTAL HIP ARTHROPLASTY ANTERIOR APPROACH;  Surgeon: Mcarthur Rossetti, MD;  Location: [facility] ORS;  Service: Orthopedics;  Laterality: Left;  . TOTAL HIP ARTHROPLASTY Right 04/04/2016   Procedure: RIGHT TOTAL HIP ARTHROPLASTY ANTERIOR APPROACH;  Surgeon: Mcarthur Rossetti, MD;  Location: [facility] ORS;  Service: Orthopedics;  Laterality: Right;   Family History:  Family History  Problem Relation Age of Onset  . Heart disease Father        Vague not clearly cardiac  Family Psychiatric  History: Denies Social History:  Social History   Substance and Sexual Activity  Alcohol Use No  . Alcohol/week: 0.0 standard drinks   Comment: socially     Social History   Substance and Sexual Activity  Drug Use No    Social History   Socioeconomic History  . Marital  status: Divorced    Spouse name: Not on file  . Number of children: 0  . Years of education: college  . Highest education level: Not on file  Occupational History  . Not on file  Tobacco Use  . Smoking status: Never Smoker  . Smokeless tobacco: Never Used  Substance and Sexual Activity  . Alcohol use: No    Alcohol/week: 0.0 standard drinks    Comment: socially  . Drug use: No  . Sexual activity: Yes    Partners: Male    Birth control/protection: None    Comment: pt. given condoms 11/2019  Other Topics Concern  . 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     Drinks about 1 soda a day    Social Determinants of Health   Financial Resource Strain:   . Difficulty of Paying Living Expenses: Not on file  Food Insecurity:   . Worried About Charity fundraiser in the Last Year: Not on file  . Ran Out of Food in the Last Year: Not on file  Transportation Needs:   . Lack of Transportation (Medical): Not on file  . Lack of Transportation (Non-Medical): Not on file  Physical Activity:   . Days of Exercise per Week: Not on file  . Minutes of Exercise per Session: Not on file  Stress:   . Feeling of Stress : Not on file  Social Connections:   . Frequency of Communication with Friends and Family: Not on file  . Frequency of Social Gatherings with Friends and Family: Not on file  . Attends Religious Services: Not on file  . Active Member of Clubs or Organizations: Not on file  . Attends Archivist Meetings: Not on file  . Marital Status: Not on file   Additional Social History:    Allergies:   Allergies  Allergen Reactions  . Hydrocodone Itching and Nausea Only    Tolerates Oxycodone  . Tramadol Itching and Nausea Only    Tolerates oxycodone    Labs:  Results for orders placed or performed during the hospital encounter of 02/04/20 (from the past 48 hour(s))  Comprehensive metabolic panel     Status: Abnormal   Collection  Time: 02/12/20  4:39 AM  Result Value Ref Range   Sodium 136 135 - 145 mmol/L   Potassium 4.3 3.5 - 5.1 mmol/L   Chloride 107 98 - 111 mmol/L   CO2 19 (L) 22 - 32 mmol/L   Glucose, Bld 89 70 - 99 mg/dL    Comment: Glucose reference range applies only to samples taken after fasting for at least 8 hours.   BUN 8 6 - 20 mg/dL   Creatinine, Ser 1.03 (H) 0.44 - 1.00 mg/dL   Calcium 9.4 8.9 - 10.3 mg/dL   Total Protein 8.0 6.5 - 8.1 g/dL   Albumin 3.8 3.5 - 5.0 g/dL   AST 23 15 - 41 U/L   ALT 22 0 - 44 U/L   Alkaline Phosphatase 78 38 - 126 U/L   Total Bilirubin 0.7 0.3 - 1.2 mg/dL   GFR  calc non Af Amer >60 >60 mL/min   GFR calc Af Amer >60 >60 mL/min   Anion gap 10 5 - 15    Comment: Performed at [facility] [address], [place], [place] [address]  CBC     Status: Abnormal   Collection Time: 02/12/20  4:39 AM  Result Value Ref Range   WBC 3.0 (L) 4.0 - 10.5 K/uL   RBC 4.01 3.87 - 5.11 MIL/uL   Hemoglobin 12.4 12.0 - 15.0 g/dL   HCT 38.4 36.0 - 46.0 %   MCV 95.8 80.0 - 100.0 fL   MCH 30.9 26.0 - 34.0 pg   MCHC 32.3 30.0 - 36.0 g/dL   RDW 13.4 11.5 - 15.5 %   Platelets 159 150 - 400 K/uL   nRBC 0.0 0.0 - 0.2 %    Comment: Performed at [facility], [place] [address], [place], [place] [address]    Current Facility-Administered Medications  Medication Dose Route Frequency Provider Last Rate Last Admin  . acetaminophen (TYLENOL) tablet 650 mg  650 mg Oral Q6H PRN Bonnita Hollow, MD   650 mg at 02/04/20 2127  . amitriptyline (ELAVIL) tablet 25 mg  25 mg Oral QHS Bonnita Hollow, MD   25 mg at 02/12/20 2115  . azithromycin (ZITHROMAX) 1,200 mg in dextrose 5 % 250 mL IVPB  1,200 mg Intravenous Weekly Carlyle Basques, MD 250 mL/hr at 02/08/20 1131 1,200 mg at 02/08/20 1131  . clindamycin (CLEOCIN) IVPB 600 mg  600 mg Intravenous Q6H Carlyle Basques, MD 100 mL/hr at 02/13/20 0539 600 mg at 02/13/20 0539  . enoxaparin (LOVENOX) injection 40 mg  40 mg Subcutaneous Q24H  Evening Shade Callas, NP   40 mg at 02/12/20 1216  . hydrocortisone (ANUSOL-HC) suppository 25 mg  25 mg Rectal BID PRN Bonnita Hollow, MD      . hydrocortisone 1 % ointment 1 application  1 application Topical BID PRN Bonnita Hollow, MD      . lidocaine (LIDODERM) 5 % 1 patch  1 patch Transdermal Daily PRN Bonnita Hollow, MD      . naproxen (NAPROSYN) tablet 250 mg  250 mg Oral Q8H PRN Bonnita Hollow, MD   250 mg at 02/09/20 1420  . ondansetron (ZOFRAN) injection 4 mg  4 mg Intravenous Q8H PRN Autry-Lott, Simone, DO   4 mg at 02/10/20 1010  . Pyrimethamine-Leucovorin 50-20 MG CAPS 50 mg  50 mg Oral Daily Zenia Resides, MD   50 mg at 02/12/20 2114   And  . Pyrimethamine-Leucovorin 25-10 MG CAPS 25 mg  25 mg Oral Q2000 Zenia Resides, MD   25 mg at 02/12/20 2115  . sodium chloride flush (NS) 0.9 % injection 10-40 mL  10-40 mL Intracatheter PRN Hensel, Jamal Collin, MD      . sulfamethoxazole-trimethoprim (BACTRIM) 400-80 MG per tablet 2 tablet  2 tablet Oral Daily Anderson, Chelsey L, DO   2 tablet at 02/13/20 1023    Musculoskeletal: Strength & Muscle Tone: within normal limits Gait & Station: normal Patient leans: N/A  Psychiatric Specialty Exam: Physical Exam  Nursing note and vitals reviewed. Constitutional: She is oriented to person, place, and time. She appears well-developed.  HENT:  Head: Normocephalic.  Cardiovascular: Normal rate.  Respiratory: Effort normal.  Neurological: She is alert and oriented to person, place, and time.    Review of Systems  Constitutional: Negative.   HENT: Negative.   Eyes: Negative.  Respiratory: Negative.   Cardiovascular: Negative.   Gastrointestinal: Negative.   Genitourinary: Negative.   Musculoskeletal: Negative.   Skin: Negative.   Neurological: Negative.   Psychiatric/Behavioral: Positive for sleep disturbance.    Blood pressure 104/67, pulse 87, temperature 98 F (36.7 C), temperature source Oral, resp. rate 15,  height 5\' 5"  (1.651 m), weight 82.4 kg, last menstrual period 02/01/2020, SpO2 100 %.Body mass index is 30.24 kg/m.  General Appearance: Casual and Fairly Groomed  Eye Contact:  Good  Speech:  Clear and Coherent and Normal Rate  Volume:  Normal  Mood:  Euthymic  Affect:  Congruent  Thought Process:  Coherent, Goal Directed and Descriptions of Associations: Intact  Orientation:  Full (Time, Place, and Person)  Thought Content:  WDL and Logical  Suicidal Thoughts:  No  Homicidal Thoughts:  No  Memory:  Immediate;   Good Recent;   Good Remote;   Good  Judgement:  Good  Insight:  Good  Psychomotor Activity:  Normal  Concentration:  Concentration: Good and Attention Span: Good  Recall:  Good  Fund of Knowledge:  Good  Language:  Good   Akathisia:  No  Handed:  Right  AIMS (if indicated):     Assets:  Communication Skills Desire for Improvement Financial Resources/Insurance Housing Intimacy Leisure Time [place] Talents/Skills Transportation  ADL's:  Intact  Cognition:  WNL  Sleep:        Treatment Plan Summary: Case discussed with Dr. Dwyane Dee.  Recommend consider increase Elavil to 50 mg related to patient complains of insomnia.  Disposition: No evidence of imminent risk to self or others at present.   Patient does not meet criteria for psychiatric inpatient admission. Discussed crisis plan, support from social network, calling 911, coming to the Emergency Department, and calling Suicide Hotline.  Recommend follow-up with outpatient psychiatry.  Emmaline Kluver, [place] 02/13/2020 10:27 AM

## 2020-02-13 NOTE — Progress Notes (Signed)
FPTS Interim Progress Note  See note from Dr. Caron Presume regarding patient's seizure.  Spoke with Dr. Lorraine Lax regarding patient.  Agreed with EEG, also get CT head and start Keppra 1.5 gram then 500mg  BID for maintenance.  Neuro plans to see.  Appreciate their recommendations.  Douglas, DO 02/13/2020, 2:16 PM PGY-2, [facility] Service pager [phone]

## 2020-02-13 NOTE — Progress Notes (Signed)
FPTS Interim Progress Note  S: Was called to patient's room regarding patient having seizure.  Patient was reportedly in the chair at bedside, she got up and then appeared as if she was reaching down towards the ground.  Care partner in the room then helped the patient to the floor where she had a witnessed tonic-clonic seizure.  When other nursing staff and care partners arrived in the room the patient was reportedly no longer seizing.  They were able to get her into the bed.  Upon my arrival the patient was sitting up in bed.  She was lethargic but would respond to questions.  She was oriented to person and place but not time.  She reported that she was unaware of what had occurred and when asked what was the last thing she remembered she was unable to answer that question.  Patient denied any weakness.  She did complain of chest pain with deep inspiration.  O: BP 130/88   Pulse (!) 144   Temp 98.2 F (36.8 C)   Resp 20   Ht 5\' 5"  (1.651 m)   Wt 82.4 kg   LMP 02/01/2020 (Exact Date)   SpO2 100%   BMI 30.24 kg/m   General: Laying in bed, eyes closed, no acute distress Cardio: Patient was tachycardic on exam in the 140s.  No murmur appreciated.  Blood pressure was initially 180s/100s.  Recheck a few minutes later was 140s over 90s.  No chest wall tenderness but complains of chest pain with deep inspiration Respiratory: Normal work of breathing, clear to auscultation bilaterally. Neuro: Pupils equal and reactive to light, no tongue deviation, upper extremity strength equal and bilateral.  Patient is alert and oriented to person and place but not time.  She is unaware of recent events does not remember seizing or what she was doing prior to her seizure.  A/P: Seizure -Neuro consulted, recommendations appreciated -EEG -CT head without contrast -Keppra with dose per neuro recs -EKG -Chest x-ray -Trend troponins for 2 blood draws -CBG was within normal limits  Gifford Shave,  MD 02/13/2020, 2:20 PM PGY-1, [facility] Service pager [phone]

## 2020-02-13 NOTE — Consult Note (Signed)
Requesting Physician: Dr. Tawanna Solo    Chief Complaint: Seizure  History obtained from: Patient and Chart     HPI:                                                                                                                                       Kelsey Rodriguez is a 29 y.o. 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  MRI brain was performed which showed similar size of enhancing 2.6 cm temporal lobe lesion-apparently not regressed in size or increased,  despite receiving treatment with pyrimethamine, leucovorin sulfadiazine.  Vasogenic edema has regressed slightly. ID was consulted and patient started on IV Bactrim, IV azithromycin into 2 weeks.  Patient's headache was improving.  However today, witnessed to have generalized tonic-clonic seizure lasting for few minutes, resolved spontaneously.  Patient was postictal.  No further seizures.  Neurology was consulted for further recommendations.  Patient has never had LP for confirmation of toxoplasmosis, because of high risk of herniation if LP is performed.    Past Medical History:  Diagnosis Date  . 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 Adrenal insufficiency ([place])   . Anemia of chronic disease 03/11/2012  . Back pain of lumbar region with sciatica 02/12/2015  . Bell's palsy 08/26/2013  . 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 Chronic back pain   . Chronic leg pain    bilateral knees, ankles  . Fatigue   . 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 HIV (human immunodeficiency virus infection) ([place]) 02/2012  . Laceration of ankle, right 11/18/2012  . Lumbar radiculopathy   . Meningitis 02/18/2018  . Pelvic pain   . PID (acute pelvic inflammatory disease) 02/26/2018  . Reflux esophagitis 03/11/2012  . Tuberculosis   . 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 Vertigo     Past Surgical History:   Procedure Laterality Date  . APPENDECTOMY  ~ 2000  . DILATION AND CURETTAGE OF UTERUS  2008  . ESOPHAGOGASTRODUODENOSCOPY  03/11/2012   Procedure: ESOPHAGOGASTRODUODENOSCOPY (EGD);  Surgeon: Lafayette Dragon, MD;  Location: [facility] ENDOSCOPY;  Service: Endoscopy;  Laterality: N/A;  . ESOPHAGOGASTRODUODENOSCOPY N/A 03/07/2014   Procedure: ESOPHAGOGASTRODUODENOSCOPY (EGD);  Surgeon: Gatha Mayer, MD;  [facility] ENDOSCOPY;  Service: Endoscopy;  Laterality: N/A;  . LUNG BIOPSY  02/2012  . TOTAL HIP ARTHROPLASTY Left 12/14/2015   Procedure: LEFT TOTAL HIP ARTHROPLASTY ANTERIOR APPROACH;  Surgeon: Mcarthur Rossetti, MD;  Location: [facility] ORS;  Service: Orthopedics;  Laterality: Left;  . TOTAL HIP ARTHROPLASTY Right 04/04/2016   Procedure: RIGHT TOTAL HIP ARTHROPLASTY ANTERIOR APPROACH;  Surgeon: Mcarthur Rossetti, MD;  Location: [facility] ORS;  Service: Orthopedics;  Laterality: Right;    Family History  Problem Relation Age of Onset  . Heart disease Father  Vague not clearly cardiac   Social History:  reports that she has never smoked. She has never used smokeless tobacco. She reports that she does not drink alcohol or use drugs.  Allergies:  Allergies  Allergen Reactions  . Hydrocodone Itching and Nausea Only    Tolerates Oxycodone  . Tramadol Itching and Nausea Only    Tolerates oxycodone    Medications:                                                                                                                        I reviewed home medications   ROS:                                                                                                                                     14 systems reviewed and negative except above    Examination:                                                                                                      General: Appears well-developed and well-nourished.  Psych: Affect appropriate to situation Eyes: No scleral injection HENT: No OP  obstrucion Head: Normocephalic.  Cardiovascular: Normal rate and regular rhythm.  Respiratory: Effort normal and breath sounds normal to anterior ascultation GI: Soft.  No distension. There is no tenderness.  Skin: WDI    Neurological Examination Mental Status: Drowsy, oriented x3, thought content appropriate.  Speech fluent without evidence of aphasia. Able to follow 3 step commands without difficulty. Cranial Nerves: II: Visual fields grossly normal,  III,IV, VI: ptosis not present, extra-ocular motions intact bilaterally, pupils equal, round, reactive to light and accommodation V,VII: smile symmetric, facial light touch sensation normal bilaterally VIII: hearing normal bilaterally IX,X: uvula rises symmetrically XI: bilateral shoulder shrug XII: midline tongue extension Motor: Right : Upper extremity   5/5    Left:     Upper extremity   5/5  Lower extremity   5/5     Lower extremity  5/5 Tone and bulk:normal tone throughout; no atrophy noted Sensory: Pinprick and light touch intact throughout, bilaterally Deep Tendon Reflexes: 2+ and symmetric throughout Plantars: Right: downgoing   Left: downgoing Cerebellar: normal finger-to-nose, normal rapid alternating movements and normal heel-to-shin test Gait: normal gait and station     Lab Results: Basic Metabolic Panel: Recent Labs  Lab 02/08/20 0439 02/08/20 0439 02/09/20 0827 02/09/20 0827 02/10/20 0547 02/11/20 0428 02/12/20 0439  NA 137  --  138  --  138 135 136  K 4.3  --  4.0  --  4.7 4.1 4.3  CL 108  --  106  --  108 106 107  CO2 19*  --  23  --  22 20* 19*  GLUCOSE 87  --  88  --  93 94 89  BUN 9  --  8  --  12 10 8   CREATININE 0.90  --  0.88  --  1.01* 0.83 1.03*  CALCIUM 8.8*   < > 8.9   < > 8.9 9.3 9.4   < > = values in this interval not displayed.    CBC: Recent Labs  Lab 02/07/20 0433 02/08/20 0439 02/09/20 0827 02/10/20 0547 02/12/20 0439  WBC 3.0* 3.2* 3.3* 3.0* 3.0*  HGB 11.8* 11.6* 12.2  12.0 12.4  HCT 36.6 36.0 37.6 36.7 38.4  MCV 96.3 95.7 95.9 95.6 95.8  PLT 161 170 167 156 159    Coagulation Studies: No results for input(s): LABPROT, INR in the last 72 hours.  Imaging: CT HEAD WO CONTRAST  Result Date: 02/13/2020 CLINICAL DATA:  Seizure EXAM: CT HEAD WITHOUT CONTRAST TECHNIQUE: Contiguous axial images were obtained from the base of the skull through the vertex without intravenous contrast. COMPARISON:  10/28/2019.  MRI 10/28/2019 and 02/04/2020 FINDINGS: Brain: Vasogenic edema is again noted within the right temporal lobe, less pronounced than on prior study. This corresponds to the area of known mass lesion seen on prior MRIs. No midline shift. No hemorrhage or hydrocephalus. Vascular: No hyperdense vessel or unexpected calcification. Skull: No acute calvarial abnormality. Sinuses/Orbits: Visualized paranasal sinuses and mastoids clear. Orbital soft tissues unremarkable. Other: None IMPRESSION: Area vasogenic edema within the right temporal lobe corresponding to the area of mass lesion seen on prior MRI. Electronically Signed   By: Rolm Baptise M.D.   On: 02/13/2020 15:05   DG CHEST PORT 1 VIEW  Result Date: 02/13/2020 CLINICAL DATA:  Altered EXAM: PORTABLE CHEST 1 VIEW COMPARISON:  10/29/2019 FINDINGS: The heart size and mediastinal contours are within normal limits. Both lungs are clear. The visualized skeletal structures are unremarkable. IMPRESSION: No active disease. Electronically Signed   By: Donavan Foil M.D.   On: 02/13/2020 16:06   EEG adult  Result Date: 02/13/2020 Lora Havens, MD     02/13/2020  7:22 PM Patient Name: Kelsey Rodriguez MRN: [id number] Epilepsy Attending: Lora Havens Referring Physician/Provider: Dr Charmayne Sheer Date: 02/13/2020 Duration: 23.37 mins Patient history: 29 year old female with HIV/AIDS with large right hemispheric CNS lesion-toxoplasmosis versus lymphoma. EEG to evaluate for seizure Level of alertness: awake, asleep AEDs during EEG  study: LEV Technical aspects: This EEG study was done with scalp electrodes positioned according to the 10-20 International system of electrode placement. Electrical activity was acquired at a sampling rate of 500Hz  and reviewed with a high frequency filter of 70Hz  and a low frequency filter of 1Hz . EEG data were recorded continuously and digitally stored. DESCRIPTION: During awake state, no clear posterior dominant rhythm  was seen. Sleep was characterized by vertex waves, sleep spindles (12-14Hz ), maximal frontocentral. There is also continuous generalized 3-5hz  theta-delta slowing in right temporal region.  Hyperventilation and photic stimulation were not performed. ABNORMALITY - Continuous slow, right temporal region IMPRESSION: This study is suggestive of cortical dysfunction in right temporal region likely secondary to underlying lesion. No seizures or epileptiform discharges were seen throughout the recording. Lora Havens     I have reviewed the above imaging : CT Head, MRI Brain    ASSESSMENT AND PLAN  29 y.o. 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 Patient had 1 generalized tonic-clonic seizure, on assessment patient appeared closer to her baseline.  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

## 2020-02-14 DIAGNOSIS — N179 Acute kidney failure, unspecified: Secondary | ICD-10-CM

## 2020-02-14 DIAGNOSIS — G40409 Other generalized epilepsy and epileptic syndromes, not intractable, without status epilepticus: Secondary | ICD-10-CM

## 2020-02-14 LAB — CBC
HCT: 38.2 % (ref 36.0–46.0)
Hemoglobin: 12.9 g/dL (ref 12.0–15.0)
MCH: 31.5 pg (ref 26.0–34.0)
MCHC: 33.8 g/dL (ref 30.0–36.0)
MCV: 93.2 fL (ref 80.0–100.0)
Platelets: 161 10*3/uL (ref 150–400)
RBC: 4.1 MIL/uL (ref 3.87–5.11)
RDW: 13.4 % (ref 11.5–15.5)
WBC: 3.5 10*3/uL — ABNORMAL LOW (ref 4.0–10.5)
nRBC: 0 % (ref 0.0–0.2)

## 2020-02-14 LAB — COMPREHENSIVE METABOLIC PANEL
ALT: 24 U/L (ref 0–44)
AST: 28 U/L (ref 15–41)
Albumin: 4 g/dL (ref 3.5–5.0)
Alkaline Phosphatase: 95 U/L (ref 38–126)
Anion gap: 11 (ref 5–15)
BUN: 9 mg/dL (ref 6–20)
CO2: 20 mmol/L — ABNORMAL LOW (ref 22–32)
Calcium: 9.3 mg/dL (ref 8.9–10.3)
Chloride: 105 mmol/L (ref 98–111)
Creatinine, Ser: 1.35 mg/dL — ABNORMAL HIGH (ref 0.44–1.00)
GFR calc Af Amer: >60 mL/min (ref >60)
GFR calc non Af Amer: 52 mL/min — ABNORMAL LOW (ref >60)
Glucose, Bld: 89 mg/dL (ref 70–99)
Potassium: 3.8 mmol/L (ref 3.5–5.1)
Sodium: 136 mmol/L (ref 135–145)
Total Bilirubin: 0.7 mg/dL (ref 0.3–1.2)
Total Protein: 8.4 g/dL — ABNORMAL HIGH (ref 6.5–8.1)

## 2020-02-14 LAB — TSH: TSH: 0.532 u[IU]/mL (ref 0.350–4.500)

## 2020-02-14 LAB — T4, FREE: Free T4: 0.76 ng/dL (ref 0.61–1.12)

## 2020-02-14 MED ORDER — AMITRIPTYLINE HCL 50 MG PO TABS
25.0000 mg | ORAL_TABLET | Freq: Every day | ORAL | Status: DC
  Administered 2020-02-14: 25 mg via ORAL
  Filled 2020-02-14: qty 1

## 2020-02-14 MED ORDER — DEXAMETHASONE 4 MG PO TABS
4.0000 mg | ORAL_TABLET | Freq: Four times a day (QID) | ORAL | Status: DC
  Administered 2020-02-14 – 2020-02-16 (×9): 4 mg via ORAL
  Filled 2020-02-14 (×9): qty 1

## 2020-02-14 MED ORDER — AMITRIPTYLINE HCL 50 MG PO TABS: 50.0000 mg | ORAL_TABLET | Freq: Every day | ORAL | Status: DC

## 2020-02-14 NOTE — Plan of Care (Signed)
Plan of care reviewed with pt at bedside. Pt pleasant. HR tachy, MD aware. Sitter dc per MD, pt stable, resting in bed. Midline catheter in place. Seizure precautions continued. Bed locked in low position, alarms on. Pt stable at this time, will continue to monitor. Call bell in reach.  Problem: Education: Goal: Knowledge of General Education information will improve Description: Including pain rating scale, medication(s)/side effects and non-pharmacologic comfort measures Outcome: Progressing   Problem: Health Behavior/Discharge Planning: Goal: Ability to manage health-related needs will improve Outcome: Progressing   Problem: Clinical Measurements: Goal: Ability to maintain clinical measurements within normal limits will improve Outcome: Progressing Goal: Will remain free from infection Outcome: Progressing Goal: Diagnostic test results will improve Outcome: Progressing Goal: Respiratory complications will improve Outcome: Progressing Goal: Cardiovascular complication will be avoided Outcome: Progressing   Problem: Activity: Goal: Risk for activity intolerance will decrease Outcome: Progressing   Problem: Nutrition: Goal: Adequate nutrition will be maintained Outcome: Progressing   Problem: Coping: Goal: Level of anxiety will decrease Outcome: Progressing   Problem: Elimination: Goal: Will not experience complications related to bowel motility Outcome: Progressing Goal: Will not experience complications related to urinary retention Outcome: Progressing   Problem: Pain Managment: Goal: General experience of comfort will improve Outcome: Progressing   Problem: Safety: Goal: Ability to remain free from injury will improve Outcome: Progressing   Problem: Skin Integrity: Goal: Risk for impaired skin integrity will decrease Outcome: Progressing

## 2020-02-14 NOTE — Progress Notes (Signed)
Family Medicine Teaching Service Daily Progress Note Intern Pager: [phone]  Patient name: Kelsey Rodriguez Medical record number: [id number] Date of birth: 11-06-1991 Age: 29 y.o. Gender: female  Primary Care Provider: Gerlene Fee, DO Consultants: ID Code Status: FULL  Pt Overview and Major Events to Date:  2/20 Admitted, ID consulted   Assessment and Plan: [name] is a 29 y.o. female presenting with acute-on-chronic HA w/ N/V. 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  #Acute on chronic headache likely secondary to prior toxoplasmosis CNS lesion w/o mass effect, improving Reports no headaches over the last 5 days.  MRI showed mild reduction in surrounding inflammation, stable mass with ring-enhancing lesion. No neurologic deficits.  -ID consulted, appreciate recommendations: ABX x2 weeks, repeat MRI in 2-3 weeks -IV Clindamycin 600mg  q6h (started 02/25-) -PO Pyrimethamine 200 mg X 1 Then 75 mg daily (started 2/25-) -PO Leucovorin 30 mg daily (started 2/25-) -IV Azithromycin 1200mg  weekly (last had 02/24, next dose March 3) -PO Bactrim 400-80mg  QD (2/26-) -Amitriptyline 25 mg  -Tylenol, naproxen as needed   New onset seizures Initially patient had episode where she became nonresponsive and had a witnessed seizure.  Neurology was consulted.  Patient had CT head showing area of vasogenic edema within the right temporal lobe corresponding to the area of the mass lesion seen on prior MRI.  EEG showed cortical dysfunction in the right temporal region which neurology believes is most likely related to the underlying lesion in  that area.  It is on no seizure or epileptiform discharges. -Neurology consulted, recommendations appreciated -Loaded with Keppra 1.5 g -Start Keppra 500 mg twice daily -Seizure precautions -2 mg Ativan as needed for seizures greater than 2 minutes -Continue treatment of toxoplasmosis per ID  HIV  AIDS, poorly controlled, unchanged Absolute lymphocytes count 1190.  CD4 count was 36 on 11/2019. Currently on ART.  Endorses compliance. 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 CD4 count <35. Lymphocyte count 847. Viral load 131K. 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 -Multimodality pain regimen including Tylenol, naproxen, lidocaine, K pad as needed -PT evaluation  Moderate Depression w/ suicidal ideation PHQ9 score of 13. Moderate depression with several days of suicidal ideation with previous attempt. Patient prefers outpatient treatment. -Psych consult reevaluation: Patient does not meet inpatient criteria -Psych recommends Elavil increased to 50 mg  FEN/GI:Regular diet, patient complaining of constipation so MiraLAX x1 was given Prophylaxis:lovenox  Disposition: Pending psych re-evaluation likely 3/1  Subjective:  Patient denies any seizure events overnight.  Reports that she slept well and is doing well this morning.  Does complain that her lip is still swollen from her seizure event yesterday and her tongue hurts.  No clear abrasions noted on tongue but patient reports when she drinks pineapple juice it burns.  Objective: Temp:  [97.8 F (36.6 C)-98.2 F (36.8 C)] 97.8 F (36.6 C) (03/02 0722) Pulse Rate:  [91-144] 102 (03/02 0722) Resp:  [16-20] 16 (03/02 0722) BP: (110-189)/(71-102) 115/71 (03/02 0722) SpO2:  [97 %-100 %] 100 % (03/02 0722)  Physical Exam: General: Well-appearing, resting comfortably in  bed. HEENT: Patient has  abrasion to right lower lip.  No bite marks noted on tongue although patient reports she does have spots that she feels like a burning when she drinks pineapple juice Cardiac: Tachycardic in the 110s.  Regular rhythm, no murmurs appreciated Respiratory: Normal work of breathing, clear to auscultation bilaterally Extremities: No edema or cyanosis. Skin: Warm and dry, no rashes noted Neuro: alert and oriented, no focal deficits Psych: normal affect, denies any SI or HI at this time  Laboratory: Recent Labs  Lab 02/10/20 0547 02/12/20 0439 02/14/20 0519  WBC 3.0* 3.0* 3.5*  HGB 12.0 12.4 12.9  HCT 36.7 38.4 38.2  PLT 156 159 161   Recent Labs  Lab 02/11/20 0428 02/12/20 0439 02/14/20 0519  NA 135 136 136  K 4.1 4.3 3.8  CL 106 107 105  CO2 20* 19* 20*  BUN 10 8 9   CREATININE 0.83 1.03* 1.35*  CALCIUM 9.3 9.4 9.3  PROT  --  8.0 8.4*  BILITOT  --  0.7 0.7  ALKPHOS  --  78 95  ALT  --  22 24  AST  --  23 28  GLUCOSE 94 89 89   Imaging/Diagnostic Tests: CT HEAD WO CONTRAST  Result Date: 02/13/2020 CLINICAL DATA:  Seizure EXAM: CT HEAD WITHOUT CONTRAST TECHNIQUE: Contiguous axial images were obtained from the base of the skull through the vertex without intravenous contrast. COMPARISON:  10/28/2019.  MRI 10/28/2019 and 02/04/2020 FINDINGS: Brain: Vasogenic edema is again noted within the right temporal lobe, less pronounced than on prior study. This corresponds to the area of known mass lesion seen on prior MRIs. No midline shift. No hemorrhage or hydrocephalus. Vascular: No hyperdense vessel or unexpected calcification. Skull: No acute calvarial abnormality. Sinuses/Orbits: Visualized paranasal sinuses and mastoids clear. Orbital soft tissues unremarkable. Other: None IMPRESSION: Area vasogenic edema within the right temporal lobe corresponding to the area of mass lesion seen on prior MRI. Electronically Signed   By: Rolm Baptise M.D.   On: 02/13/2020 15:05   DG CHEST PORT 1  VIEW  Result Date: 02/13/2020 CLINICAL DATA:  Altered EXAM: PORTABLE CHEST 1 VIEW COMPARISON:  10/29/2019 FINDINGS: The heart size and mediastinal contours are within normal limits. Both lungs are clear. The visualized skeletal structures are unremarkable. IMPRESSION: No active disease. Electronically Signed   By: Donavan Foil M.D.   On: 02/13/2020 16:06   EEG adult  Result Date: 02/13/2020 Lora Havens, MD     02/13/2020  7:22 PM Patient Name: Imara Fells MRN: [id number] Epilepsy Attending: Lora Havens Referring Physician/Provider: Dr Charmayne Sheer Date: 02/13/2020 Duration: 23.37 mins Patient history: 29 year old female with HIV/AIDS with large right hemispheric CNS lesion-toxoplasmosis versus lymphoma. EEG to evaluate for seizure Level of alertness: awake, asleep AEDs during EEG study: LEV Technical aspects: This EEG study was done with scalp electrodes positioned according to the 10-20 International system of electrode placement. Electrical activity was acquired at a sampling rate of 500Hz  and reviewed with a high frequency filter of 70Hz  and a low frequency filter of 1Hz . EEG data were recorded continuously and digitally stored. DESCRIPTION: During awake state, no clear posterior dominant rhythm was seen. Sleep was characterized by vertex waves, sleep spindles (12-14Hz ), maximal frontocentral. There is also continuous generalized 3-5hz  theta-delta slowing in right temporal region.  Hyperventilation and photic stimulation were not performed. ABNORMALITY - Continuous slow, right temporal region IMPRESSION: This study is suggestive of cortical dysfunction in right temporal region likely secondary to  underlying lesion. No seizures or epileptiform discharges were seen throughout the recording. Priyanka Carolanne Grumbling, MD 02/14/2020, 7:31 AM PGY-1, [facility] Intern pager: [phone], text pages welcome

## 2020-02-14 NOTE — Plan of Care (Signed)

## 2020-02-14 NOTE — Progress Notes (Signed)
[facility] for Infectious Disease    Date of Admission:  02/04/2020      ID: Kelsey Rodriguez is a 29 y.o. female with  HIV disease, poorly controlled. CNS toxo Principal Problem:   CNS toxoplasmosis, suspected  Active Problems:   HIV (human immunodeficiency virus infection) ([place])   Chest pain   AIDS (acquired immune deficiency syndrome) ([facility])   Intractable headache   Intracranial mass    Subjective: Afebrile but had tonic clonic seizure. Has some amnesia to the event. Had some chest pain but unclear if she hit her chest against bed during seizure - she was started on anti-epileptics Medications:  . amitriptyline  25 mg Oral QHS  . dexamethasone  4 mg Oral Q6H  . enoxaparin (LOVENOX) injection  40 mg Subcutaneous Q24H  . levETIRAcetam  500 mg Oral BID  . Pyrimethamine-Leucovorin  50 mg Oral Daily   And  . Pyrimethamine-Leucovorin  25 mg Oral Q2000  . sulfamethoxazole-trimethoprim  1 tablet Oral Daily    Objective: Vital signs in last 24 hours: Temp:  [97.8 F (36.6 C)-98.5 F (36.9 C)] 98.4 F (36.9 C) (03/02 1941) Pulse Rate:  [98-125] 108 (03/02 1700) Resp:  [14-23] 20 (03/02 1700) BP: (103-130)/(71-92) 130/86 (03/02 1941) SpO2:  [100 %] 100 % (03/02 1700) Physical Exam  Constitutional:  oriented to person, place, and time. appears well-developed and well-nourished. No distress.  HENT: [place]/AT, PERRLA, no scleral icterus Mouth/Throat: Oropharynx is clear and moist. No oropharyngeal exudate.  Cardiovascular: Normal rate, regular rhythm and normal heart sounds. Exam reveals no gallop and no friction rub.  No murmur heard.  Pulmonary/Chest: Effort normal and breath sounds normal. No respiratory distress.  has no wheezes.  Neck = supple, no nuchal rigidity Abdominal: Soft. Bowel sounds are normal.  exhibits no distension. There is no tenderness.  Lymphadenopathy: no cervical adenopathy. No axillary adenopathy Neurological: alert and oriented to person, place, and  time.  Skin: Skin is warm and dry. No rash noted. No erythema.  Psychiatric: a normal mood and affect.  behavior is normal.    Lab Results Recent Labs    02/12/20 0439 02/14/20 0519  WBC 3.0* 3.5*  HGB 12.4 12.9  HCT 38.4 38.2  NA 136 136  K 4.3 3.8  CL 107 105  CO2 19* 20*  BUN 8 9  CREATININE 1.03* 1.35*   Liver Panel Recent Labs    02/12/20 0439 02/14/20 0519  PROT 8.0 8.4*  ALBUMIN 3.8 4.0  AST 23 28  ALT 22 24  ALKPHOS 78 95  BILITOT 0.7 0.7    Studies/Results: CT HEAD WO CONTRAST  Result Date: 02/13/2020 CLINICAL DATA:  Seizure EXAM: CT HEAD WITHOUT CONTRAST TECHNIQUE: Contiguous axial images were obtained from the base of the skull through the vertex without intravenous contrast. COMPARISON:  10/28/2019.  MRI 10/28/2019 and 02/04/2020 FINDINGS: Brain: Vasogenic edema is again noted within the right temporal lobe, less pronounced than on prior study. This corresponds to the area of known mass lesion seen on prior MRIs. No midline shift. No hemorrhage or hydrocephalus. Vascular: No hyperdense vessel or unexpected calcification. Skull: No acute calvarial abnormality. Sinuses/Orbits: Visualized paranasal sinuses and mastoids clear. Orbital soft tissues unremarkable. Other: None IMPRESSION: Area vasogenic edema within the right temporal lobe corresponding to the area of mass lesion seen on prior MRI. Electronically Signed   By: Rolm Baptise M.D.   On: 02/13/2020 15:05   DG CHEST PORT 1 VIEW  Result Date: 02/13/2020 CLINICAL  DATA:  Altered EXAM: PORTABLE CHEST 1 VIEW COMPARISON:  10/29/2019 FINDINGS: The heart size and mediastinal contours are within normal limits. Both lungs are clear. The visualized skeletal structures are unremarkable. IMPRESSION: No active disease. Electronically Signed   By: Donavan Foil M.D.   On: 02/13/2020 16:06   EEG adult  Result Date: 02/13/2020 Lora Havens, MD     02/13/2020  7:22 PM Patient Name: Kelsey Rodriguez MRN: [id number] Epilepsy  Attending: Lora Havens Referring Physician/Provider: Dr Charmayne Sheer Date: 02/13/2020 Duration: 23.37 mins Patient history: 29 year old female with HIV/AIDS with large right hemispheric CNS lesion-toxoplasmosis versus lymphoma. EEG to evaluate for seizure Level of alertness: awake, asleep AEDs during EEG study: LEV Technical aspects: This EEG study was done with scalp electrodes positioned according to the 10-20 International system of electrode placement. Electrical activity was acquired at a sampling rate of 500Hz  and reviewed with a high frequency filter of 70Hz  and a low frequency filter of 1Hz . EEG data were recorded continuously and digitally stored. DESCRIPTION: During awake state, no clear posterior dominant rhythm was seen. Sleep was characterized by vertex waves, sleep spindles (12-14Hz ), maximal frontocentral. There is also continuous generalized 3-5hz  theta-delta slowing in right temporal region.  Hyperventilation and photic stimulation were not performed. ABNORMALITY - Continuous slow, right temporal region IMPRESSION: This study is suggestive of cortical dysfunction in right temporal region likely secondary to underlying lesion. No seizures or epileptiform discharges were seen throughout the recording. Kelsey Rodriguez     Assessment/Plan: New onset seizure = likely from CNS lesion. Will also start dexamethasone 4mg  q 6 hr for next few days to help decrease inflammation  Cns toxo = continue current regimen  aki = has slight bump. Will encourage good liquid intake  hiv disease = continue on oi proph. With weekly azithromycin and bactrim daily  [facility] Cell: [phone] Pager: [phone]  02/14/2020, 11:09 PM

## 2020-02-14 NOTE — TOC Initial Note (Signed)
Transition of Care [facility]) - Initial/Assessment Note    Patient Details  Name: Kelsey Rodriguez MRN: [id number] Date of Birth: [birth year]  Transition of Care [facility]) CM/SW Contact:    Kelsey Collet, RN Phone Number: 02/14/2020, 11:50 AM  Clinical Narrative:          Kelsey Rodriguez w patient at bedside. She states that she plans on returning home at DC and would like to go to [facility] as she has gone there in the past. She states that she has her spouse who will be able to provide transport. We discussed that she has some transportation benefits with Medicaid, she was not aware. These have been added to AVS for her convenience. She is aware that OP PT will call her for her appointment. She endorses Kelsey Rodriguez as PCP and denies barriers to getting medications.           Expected Discharge Plan: Home/Self Care Barriers to Discharge: Continued Medical Work up   Patient Goals and CMS Choice Patient states their goals for this hospitalization and ongoing recovery are:: to go home      Expected Discharge Plan and Services Expected Discharge Plan: Home/Self Care                                              Prior Living Arrangements/Services   Lives with:: Spouse                   Activities of Daily Living Home Assistive Devices/Equipment: None ADL Screening (condition at time of admission) Patient's cognitive ability adequate to safely complete daily activities?: Yes Is the patient deaf or have difficulty hearing?: No Does the patient have difficulty seeing, even when wearing glasses/contacts?: No Does the patient have difficulty concentrating, remembering, or making decisions?: No Patient able to express need for assistance with ADLs?: Yes Does the patient have difficulty dressing or bathing?: No Independently performs ADLs?: Yes (appropriate for developmental age) Does the patient have difficulty walking or climbing stairs?: No Weakness of Legs:  None Weakness of Arms/Hands: None  Permission Sought/Granted                  Emotional Assessment              Admission diagnosis:  Symptomatic HIV infection ([place]) [B20] Intractable headache [R51.9] Chronic intractable headache, unspecified headache type [R51.9, G89.29] Patient Active Problem List   Diagnosis Date Noted  . Intracranial mass   . Intractable headache 02/04/2020  . Encephalitis, myelitis, 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[place]) 01/31/2020  . Rotator cuff strain 01/26/2020  . 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 AIDS (acquired immune deficiency syndrome) ([place]) 11/07/2019  . Cerebral edema ([place]) 10/28/2019  . Midline shift of brain   . Pruritus 08/29/2019  . Tendinopathy of left shoulder 01/18/2019  . Chronic pelvic pain in female 01/04/2019  . Lower abdominal pain 06/21/2018  . Syphilis 02/26/2018  . Tuberculosis   . Infertility, female 02/12/2018  . 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 Acute right-sided low back pain with right-sided sciatica 08/24/2017  . Complex regional pain syndrome 02/03/2017  . Headache 10/28/2016  . Avascular necrosis of bone of right hip ([place]) 04/04/2016  . 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 Avascular necrosis of bone of left hip ([place]) 12/14/2015  . Status  post total replacement of left hip 12/14/2015  . Vertigo 01/23/2015  . Primary adrenal insufficiency ([place]) 01/03/2015  . Chest pain 07/07/2013  . HIV (human immunodeficiency virus infection) ([place]) 03/16/2012  . 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   PCP:  Gerlene Fee, DO Pharmacy:   [facility] [place], [place] [address] [address] Phone: [phone] Fax: [phone]     Social Determinants of Health (SDOH) Interventions    Readmission Risk Interventions Readmission Risk Prevention Plan 11/08/2019   Transportation Screening Complete  PCP or Specialist Appt within 5-7 Days Complete  Home Care Screening Complete  Medication Review (RN CM) Complete  Some recent data might be hidden

## 2020-02-14 NOTE — Progress Notes (Signed)
Reason for consult: Seizures  Subjective: Patient is pleasantly sitting on the bed.  Denies any headache.  No further seizures overnight   ROS: negative except above  Examination  Vital signs in last 24 hours: Temp:  [97.8 F (36.6 C)-98.5 F (36.9 C)] 98.5 F (36.9 C) (03/02 1115) Pulse Rate:  [98-125] 104 (03/02 1400) Resp:  [14-18] 18 (03/02 1400) BP: (103-128)/(71-92) 103/72 (03/02 1200) SpO2:  [97 %-100 %] 100 % (03/02 1400)  General: lying in bed CVS: pulse-normal rate and rhythm RS: breathing comfortably Extremities: normal   Neuro: MS: Alert, oriented, follows commands CN: pupils equal and reactive,  EOMI, face symmetric, tongue midline, normal sensation over face, Motor: 5/5 strength in all 4 extremities Reflexes: 2+ bilaterally over patella, biceps, plantars: flexor Coordination: normal Gait: not tested  Basic Metabolic Panel: Recent Labs  Lab 02/09/20 0827 02/09/20 0827 02/10/20 0547 02/10/20 0547 02/11/20 0428 02/12/20 0439 02/14/20 0519  NA 138  --  138  --  135 136 136  K 4.0  --  4.7  --  4.1 4.3 3.8  CL 106  --  108  --  106 107 105  CO2 23  --  22  --  20* 19* 20*  GLUCOSE 88  --  93  --  94 89 89  BUN 8  --  12  --  10 8 9   CREATININE 0.88  --  1.01*  --  0.83 1.03* 1.35*  CALCIUM 8.9   < > 8.9   < > 9.3 9.4 9.3   < > = values in this interval not displayed.    CBC: Recent Labs  Lab 02/08/20 0439 02/09/20 0827 02/10/20 0547 02/12/20 0439 02/14/20 0519  WBC 3.2* 3.3* 3.0* 3.0* 3.5*  HGB 11.6* 12.2 12.0 12.4 12.9  HCT 36.0 37.6 36.7 38.4 38.2  MCV 95.7 95.9 95.6 95.8 93.2  PLT 170 167 156 159 161     Coagulation Studies: No results for input(s): LABPROT, INR in the last 72 hours.  Imaging Reviewed: CT head and MRI brain  EEG:  This study is suggestive of cortical dysfunction in right temporal region likely secondary to underlying lesion. No seizures or epileptiform discharges were seen throughout the recording.  ASSESSMENT  AND PLAN  29 y.o. 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 Patient had 1 generalized tonic-clonic seizure, on assessment patient appeared closer to her baseline.  Seizure secondary to Right temporal CNS lesion ( suspected Toxoplasmosis)   Recommendations -Continue 500 mg twice daily of Keppra -CT head to assess for hemorrhage into the lesion, worsening in size of lesion -Routine EEG -Seizure precautions -Ativan 2 mg as needed for seizure greater than 2 minutes -Treatment of CNS toxoplasmosis per ID - No drivingx 6 months  Needs outpatient neurology follow up in 4 weeks   Keith Felten [facility] Pager Number [phone] For questions after 7pm please refer to AMION to reach the Neurologist on call

## 2020-02-15 LAB — COMPREHENSIVE METABOLIC PANEL
ALT: 25 U/L (ref 0–44)
AST: 27 U/L (ref 15–41)
Albumin: 4.1 g/dL (ref 3.5–5.0)
Alkaline Phosphatase: 94 U/L (ref 38–126)
Anion gap: 12 (ref 5–15)
BUN: 8 mg/dL (ref 6–20)
CO2: 21 mmol/L — ABNORMAL LOW (ref 22–32)
Calcium: 10.2 mg/dL (ref 8.9–10.3)
Chloride: 101 mmol/L (ref 98–111)
Creatinine, Ser: 0.91 mg/dL (ref 0.44–1.00)
GFR calc Af Amer: >60 mL/min (ref >60)
GFR calc non Af Amer: >60 mL/min (ref >60)
Glucose, Bld: 127 mg/dL — ABNORMAL HIGH (ref 70–99)
Potassium: 4.1 mmol/L (ref 3.5–5.1)
Sodium: 134 mmol/L — ABNORMAL LOW (ref 135–145)
Total Bilirubin: 0.4 mg/dL (ref 0.3–1.2)
Total Protein: 9.1 g/dL — ABNORMAL HIGH (ref 6.5–8.1)

## 2020-02-15 LAB — T3, FREE: T3, Free: 2.6 pg/mL (ref 2.0–4.4)

## 2020-02-15 MED ORDER — FLUOXETINE HCL 20 MG PO CAPS
20.0000 mg | ORAL_CAPSULE | Freq: Every day | ORAL | Status: DC
  Administered 2020-02-15 – 2020-02-20 (×6): 20 mg via ORAL
  Filled 2020-02-15 (×6): qty 1

## 2020-02-15 NOTE — Progress Notes (Signed)
Family Medicine Teaching Service Daily Progress Note Intern Pager: [phone]  Patient name: Kelsey Rodriguez Medical record number: [id number] Date of birth: 1991-09-26 Age: 29 y.o. Gender: female  Primary Care Provider: Gerlene Fee, DO Consultants: ID Code Status: FULL  Pt Overview and Major Events to Date:  2/20 Admitted, ID consulted   Assessment and Plan: Kelsey Rodriguez is a 29 y.o. female presenting with acute-on-chronic HA w/ N/V. 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  #Acute on chronic headache likely secondary to prior toxoplasmosis CNS lesion w/o mass effect, improving Reports no headaches over the last 7 days.  MRI showed mild reduction in surrounding inflammation, stable mass with ring-enhancing lesion. No neurologic deficits.  CT on 3/1 showed area of vasogenic edema within the right temporal lobe corresponding to the area of mass lesion seen on prior MRI -ID consulted, appreciate recommendations: ABX x2 weeks, repeat MRI in 2-3 weeks -IV Clindamycin 600mg  q6h (started 02/25-) -PO Pyrimethamine 200 mg X 1 Then 75 mg daily (started 2/25-) -PO Leucovorin 30 mg daily (started 2/25-) -IV Azithromycin 1200mg  weekly (last had 02/24, next dose March 3) -PO Bactrim 400-80mg  QD (2/26-) -Patient reports that she was not taking her amitriptyline daily prior to admission and was taking it as needed at home, going to discontinue at this time -Tylenol, naproxen as needed   New onset seizures Initially patient had episode where she became nonresponsive and had a witnessed seizure.  Neurology was consulted.  Patient had CT head  showing area of vasogenic edema within the right temporal lobe corresponding to the area of the mass lesion seen on prior MRI.  EEG showed cortical dysfunction in the right temporal region which neurology believes is most likely related to the underlying lesion in that area.  It is on no seizure or epileptiform discharges. -Neurology consulted, recommendations appreciated -Loaded with Keppra 1.5 g -Start Keppra 500 mg twice daily -Seizure precautions -2 mg Ativan as needed for seizures greater than 2 minutes -Continue treatment of toxoplasmosis per ID -No driving for 6 months -Will need outpatient neurology follow-up in 4 weeks -Will start dexamethasone 4 mg every 6 hours for the next few days to decrease inflammation per ID recs  HIV  AIDS, poorly controlled, unchanged Absolute lymphocytes count 1190.  CD4 count was 36 on 11/2019. Currently on ART.  Endorses compliance. 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 CD4 count <35. Lymphocyte count 847. Viral load 131K. 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 -Multimodality pain regimen including Tylenol, naproxen, lidocaine, K pad as needed -PT evaluation  Moderate Depression w/ suicidal ideation PHQ9 score of 13. Moderate depression with several days of suicidal ideation with previous attempt. Patient prefers outpatient treatment. -Psych consult reevaluation: Patient does not meet inpatient criteria -Psych recommends Elavil increased to 50 mg -Discontinue amitriptyline and transition to Prozac 20 mg daily  FEN/GI:Regular diet, patient complaining of constipation so MiraLAX x1 was given Prophylaxis:lovenox  Disposition: Pending psych re-evaluation likely 3/1  Subjective:  Patient is doing well this morning.  Denies any seizure-like activity overnight.  Reports that her back  pain has improved with  the Lidoderm patch.  Her tongue is no longer hurting but she is noticing some mild residual numbness on her lip where she bit it.  Denies any headaches, shortness of breath, chest pain.  Objective: Temp:  [97.8 F (36.6 C)-98.5 F (36.9 C)] 98.2 F (36.8 C) (03/03 0322) Pulse Rate:  [98-125] 108 (03/02 1700) Resp:  [14-23] 20 (03/02 1700) BP: (103-130)/(71-92) 117/73 (03/03 0322) SpO2:  [100 %] 100 % (03/02 1700)  Physical Exam: General: Sitting up in bed in no acute distress HEENT: Lip abrasion has healed but patient notes numbness.  Patient reports that pain in her tongue is also improved Cardiac: Regular rate in the 80s to 90s regular rhythm, no murmurs appreciated Respiratory: Normal work of breathing, clear to auscultation bilaterally Extremities: No edema or cyanosis. Skin: Warm and dry, no rashes noted Neuro: alert and oriented, no focal deficits Psych: normal affect, denies any SI or HI at this time  Laboratory: Recent Labs  Lab 02/10/20 0547 02/12/20 0439 02/14/20 0519  WBC 3.0* 3.0* 3.5*  HGB 12.0 12.4 12.9  HCT 36.7 38.4 38.2  PLT 156 159 161   Recent Labs  Lab 02/11/20 0428 02/12/20 0439 02/14/20 0519  NA 135 136 136  K 4.1 4.3 3.8  CL 106 107 105  CO2 20* 19* 20*  BUN 10 8 9   CREATININE 0.83 1.03* 1.35*  CALCIUM 9.3 9.4 9.3  PROT  --  8.0 8.4*  BILITOT  --  0.7 0.7  ALKPHOS  --  78 95  ALT  --  22 24  AST  --  23 28  GLUCOSE 94 89 89   Imaging/Diagnostic Tests: No results found.  Gifford Shave, MD 02/15/2020, 5:43 AM PGY-1, [place] Intern pager: [phone], text pages welcome

## 2020-02-15 NOTE — Plan of Care (Signed)

## 2020-02-15 NOTE — Progress Notes (Signed)
[facility] for Infectious Disease    Date of Admission:  02/04/2020      ID: Kelsey Rodriguez is a 29 y.o. female with  Principal Problem:   CNS toxoplasmosis, suspected  Active Problems:   HIV (human immunodeficiency virus infection) ([place])   Chest pain   AIDS (acquired immune deficiency syndrome) ([facility])   Intractable headache   Intracranial mass    Subjective: Afebrile, no headache, no recalls symptoms of seizure, no difficulty with eating, denies abdominal pain  Medications:  . dexamethasone  4 mg Oral Q6H  . enoxaparin (LOVENOX) injection  40 mg Subcutaneous Q24H  . FLUoxetine  20 mg Oral Daily  . levETIRAcetam  500 mg Oral BID  . Pyrimethamine-Leucovorin  50 mg Oral Daily   And  . Pyrimethamine-Leucovorin  25 mg Oral Q2000  . sulfamethoxazole-trimethoprim  1 tablet Oral Daily    Objective: Vital signs in last 24 hours: Temp:  [98.1 F (36.7 C)-98.5 F (36.9 C)] 98.1 F (36.7 C) (03/03 1554) Pulse Rate:  [94-102] 95 (03/03 1554) Resp:  [17-20] 17 (03/03 1104) BP: (104-138)/(73-89) 104/78 (03/03 1554) SpO2:  [96 %-100 %] 100 % (03/03 1554) Physical Exam  Constitutional:  oriented to person, place, and time. appears well-developed and well-nourished. No distress.  HENT: [place]/AT, PERRLA, no scleral icterus Mouth/Throat: Oropharynx is clear and moist. No oropharyngeal exudate.  Cardiovascular: Normal rate, regular rhythm and normal heart sounds. Exam reveals no gallop and no friction rub.  No murmur heard.  Pulmonary/Chest: Effort normal and breath sounds normal. No respiratory distress.  has no wheezes.  Neck = supple, no nuchal rigidity Abdominal: Soft. Bowel sounds are normal.  exhibits no distension. There is no tenderness.  Lymphadenopathy: no cervical adenopathy. No axillary adenopathy Neurological: alert and oriented to person, place, and time.  Skin: Skin is warm and dry. No rash noted. No erythema.  Psychiatric: a normal mood and affect.  behavior is  normal.    Lab Results Recent Labs    02/14/20 0519 02/15/20 0916  WBC 3.5*  --   HGB 12.9  --   HCT 38.2  --   NA 136 134*  K 3.8 4.1  CL 105 101  CO2 20* 21*  BUN 9 8  CREATININE 1.35* 0.91   Liver Panel Recent Labs    02/14/20 0519 02/15/20 0916  PROT 8.4* 9.1*  ALBUMIN 4.0 4.1  AST 28 27  ALT 24 25  ALKPHOS 95 94  BILITOT 0.7 0.4    Microbiology: reviewed Studies/Results: EEG adult  Result Date: 02/13/2020 Lora Havens, MD     02/13/2020  7:22 PM Patient Name: Kelsey Rodriguez MRN: [id number] Epilepsy Attending: Lora Havens Referring Physician/Provider: Dr Charmayne Sheer Date: 02/13/2020 Duration: 23.37 mins Patient history: 29 year old female with HIV/AIDS with large right hemispheric CNS lesion-toxoplasmosis versus lymphoma. EEG to evaluate for seizure Level of alertness: awake, asleep AEDs during EEG study: LEV Technical aspects: This EEG study was done with scalp electrodes positioned according to the 10-20 International system of electrode placement. Electrical activity was acquired at a sampling rate of 500Hz  and reviewed with a high frequency filter of 70Hz  and a low frequency filter of 1Hz . EEG data were recorded continuously and digitally stored. DESCRIPTION: During awake state, no clear posterior dominant rhythm was seen. Sleep was characterized by vertex waves, sleep spindles (12-14Hz ), maximal frontocentral. There is also continuous generalized 3-5hz  theta-delta slowing in right temporal region.  Hyperventilation and photic stimulation were not performed. ABNORMALITY -  Continuous slow, right temporal region IMPRESSION: This study is suggestive of cortical dysfunction in right temporal region likely secondary to underlying lesion. No seizures or epileptiform discharges were seen throughout the recording. 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 She appears to  tolerate IV if continues to improve, plan to switch to oral clindamycin on fridya - continue on dexamethasone for which we will taper over 5-7days  Seizure =due to CNS toxo, continue on keppra 500mg  bid  oi proph = continue on bactrim plus weekly azithromycin  hiv disease = will plan to start back on symtuza roughly after she has been on treatment for 2 weeks for CNS toxo  aki = back to baseline today  [facility] for Infectious Diseases Cell: [phone] Pager: [phone]  02/15/2020, 5:57 PM

## 2020-02-16 DIAGNOSIS — R569 Unspecified convulsions: Secondary | ICD-10-CM

## 2020-02-16 LAB — CBC
HCT: 35.6 % — ABNORMAL LOW (ref 36.0–46.0)
Hemoglobin: 12.1 g/dL (ref 12.0–15.0)
MCH: 31 pg (ref 26.0–34.0)
MCHC: 34 g/dL (ref 30.0–36.0)
MCV: 91.3 fL (ref 80.0–100.0)
Platelets: 199 10*3/uL (ref 150–400)
RBC: 3.9 MIL/uL (ref 3.87–5.11)
RDW: 13.5 % (ref 11.5–15.5)
WBC: 8.4 10*3/uL (ref 4.0–10.5)
nRBC: 0 % (ref 0.0–0.2)

## 2020-02-16 LAB — HIV GENOSURE(R) MG

## 2020-02-16 LAB — REFLEX TO GENOSURE(R) MG EDI: HIV GenoSure(R): 1

## 2020-02-16 LAB — BASIC METABOLIC PANEL
Anion gap: 11 (ref 5–15)
BUN: 12 mg/dL (ref 6–20)
CO2: 20 mmol/L — ABNORMAL LOW (ref 22–32)
Calcium: 9.9 mg/dL (ref 8.9–10.3)
Chloride: 105 mmol/L (ref 98–111)
Creatinine, Ser: 0.85 mg/dL (ref 0.44–1.00)
GFR calc Af Amer: >60 mL/min (ref >60)
GFR calc non Af Amer: >60 mL/min (ref >60)
Glucose, Bld: 136 mg/dL — ABNORMAL HIGH (ref 70–99)
Potassium: 4.5 mmol/L (ref 3.5–5.1)
Sodium: 136 mmol/L (ref 135–145)

## 2020-02-16 LAB — HIV-1 RNA, PCR (GRAPH) RFX/GENO EDI
HIV-1 RNA BY PCR: 134000 copies/mL
HIV-1 RNA Quant, Log: 5.127 log10copy/mL

## 2020-02-16 MED ORDER — DEXAMETHASONE 4 MG PO TABS
4.0000 mg | ORAL_TABLET | Freq: Three times a day (TID) | ORAL | Status: AC
  Administered 2020-02-17 (×3): 4 mg via ORAL
  Filled 2020-02-16 (×3): qty 1

## 2020-02-16 MED ORDER — CLINDAMYCIN PHOSPHATE 600 MG/50ML IV SOLN
600.0000 mg | Freq: Four times a day (QID) | INTRAVENOUS | Status: AC
  Administered 2020-02-16 (×2): 600 mg via INTRAVENOUS
  Filled 2020-02-16 (×2): qty 50

## 2020-02-16 MED ORDER — CLINDAMYCIN HCL 300 MG PO CAPS
600.0000 mg | ORAL_CAPSULE | Freq: Four times a day (QID) | ORAL | Status: DC
  Administered 2020-02-17 – 2020-02-20 (×14): 600 mg via ORAL
  Filled 2020-02-16 (×19): qty 2

## 2020-02-16 MED ORDER — DEXAMETHASONE 4 MG PO TABS
4.0000 mg | ORAL_TABLET | Freq: Four times a day (QID) | ORAL | Status: AC
  Administered 2020-02-16 (×2): 4 mg via ORAL
  Filled 2020-02-16 (×2): qty 1

## 2020-02-16 NOTE — Plan of Care (Signed)

## 2020-02-16 NOTE — Plan of Care (Signed)
  Problem: Coping: Goal: Level of anxiety will decrease Outcome: Progressing  Pt appears to be doing better with mood today and reports she is in a better mental state. She has been pleasant, smiling frequently, and laughing.  Problem: Pain Managment: Goal: General experience of comfort will improve Outcome: Progressing  Pt reports no pain or discomfort today. Reports she has no headache, only some minor lip numbness. No PRN meds required today.

## 2020-02-16 NOTE — Progress Notes (Addendum)
Family Medicine Teaching Service Daily Progress Note Intern Pager: [phone]  Patient name: Kelsey Rodriguez Date of birth: Apr 24, 1991 Age: 29 y.o. Gender: female  Primary Care Provider: Gerlene Fee, DO Consultants: ID Code Status: FULL  Pt Overview and Major Events to Date:  2/20 Admitted, ID consulted   Assessment and Plan: Kelsey Rodriguez is a 29 y.o. female presenting with acute-on-chronic HA w/ N/V. 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  #Acute on chronic headache likely secondary to prior toxoplasmosis CNS lesion w/o mass effect, improving Reports no headaches over the last 7 days.  MRI showed mild reduction in surrounding inflammation, stable mass with ring-enhancing lesion. No neurologic deficits.  CT on 3/1 showed area of vasogenic edema within the right temporal lobe corresponding to the area of mass lesion seen on prior MRI -ID consulted, appreciate recommendations: ABX x2 weeks, repeat MRI in 2-3 weeks.  Need to determine what date repeat MRI will be done -IV Clindamycin 600mg  q6h (started 02/25-) plan to transition to oral on Friday 3/5 -PO Pyrimethamine 200 mg X 1 Then 75 mg daily (started 2/25-) -PO Leucovorin 30 mg daily (started 2/25-) -IV Azithromycin 1200mg  weekly (last had 02/24, next dose March 3) -PO Bactrim 400-80mg  QD (2/26-) -Patient reports that she was not taking her amitriptyline daily prior to admission and was taking it as needed at home, going to discontinue at this time -Tylenol, naproxen as needed   New onset seizures Initially patient had episode where she  became nonresponsive and had a witnessed seizure.  Neurology was consulted.  Patient had CT head showing area of vasogenic edema within the right temporal lobe corresponding to the area of the mass lesion seen on prior MRI.  EEG showed cortical dysfunction in the right temporal region which neurology believes is most likely related to the underlying lesion in that area.  It is on no seizure or epileptiform discharges. -Neurology consulted, recommendations appreciated -Loaded with Keppra 1.5 g -Start Keppra 500 mg twice daily, continue at this time -Seizure precautions -2 mg Ativan as needed for seizures greater than 2 minutes -Continue treatment of toxoplasmosis per ID -No driving for 6 months -Will need outpatient neurology follow-up in 4 weeks -Continue dexamethasone which will be tapered over 5 to 7 days  HIV  AIDS, poorly controlled, unchanged Absolute lymphocytes count 1190.  CD4 count was 36 on 11/2019. Currently on ART.  Endorses compliance. 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 CD4 count <35. Lymphocyte count 847. Viral load 131K. 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 -Multimodality pain regimen including Tylenol, naproxen, lidocaine, K pad as needed -PT evaluation  Moderate Depression w/ suicidal ideation PHQ9 score of 13. Moderate depression with several days of suicidal ideation with previous attempt. Patient prefers outpatient treatment. -Psych consult reevaluation: Patient does not meet inpatient criteria -Psych recommends Elavil increased to 50 mg -Discontinue amitriptyline and transition to Prozac 20 mg daily  FEN/GI:Regular diet, patient complaining of constipation so MiraLAX x1 was given Prophylaxis:lovenox  Disposition: Pending completion of IV antibiotics and transition to oral  Subjective:  Patient is doing well today.   Denies any seizure activity overnight.  Denies any headaches, chest pain, shortness of breath, back pain.  Reports she started taking the Prozac yesterday.  Objective: Temp:  [98 F (36.7 C)-98.5 F (36.9 C)] 98 F (36.7 C) (03/03 2249) Pulse Rate:  [91-102] 91 (03/03 2249) Resp:  [17-20] 17 (03/03 1104) BP: (104-138)/(78-89) 121/78 (03/03 2249) SpO2:  [96 %-100 %] 100 % (03/03 2249)  Physical Exam: General: Sitting in bed in no acute distress HEENT: Lip abrasion healed.  Tongue pain has improved Cardiac: Regular rate in the 90s, regular rhythm, no murmurs appreciated Respiratory: Normal work of breathing, clear to auscultation bilaterally Extremities: No edema or cyanosis. Skin: Warm and dry, no rashes noted Neuro: alert and oriented, no focal deficits Psych: normal affect, denies any SI or HI at this time  Laboratory: Recent Labs  Lab 02/12/20 0439 02/14/20 0519 02/16/20 0123  WBC 3.0* 3.5* 8.4  HGB 12.4 12.9 12.1  HCT 38.4 38.2 35.6*  PLT 159 161 199   Recent Labs  Lab 02/12/20 0439 02/12/20 0439 02/14/20 0519 02/15/20 0916 02/16/20 0123  NA 136   < > 136 134* 136  K 4.3   < > 3.8 4.1 4.5  CL 107   < > 105 101 105  CO2 19*   < > 20* 21* 20*  BUN 8   < > 9 8 12   CREATININE 1.03*   < > 1.35* 0.91 0.85  CALCIUM 9.4   < > 9.3 10.2 9.9  PROT 8.0  --  8.4* 9.1*  --   BILITOT 0.7  --  0.7 0.4  --   ALKPHOS 78  --  95 94  --   ALT 22  --  24 25  --   AST 23  --  28 27  --   GLUCOSE 89   < > 89 127* 136*   < > = values in this interval not displayed.   Imaging/Diagnostic Tests: No results found.  Gifford Shave, MD 02/16/2020, 5:58 AM PGY-1, [place] Intern pager: [phone], text pages welcome

## 2020-02-16 NOTE — Progress Notes (Signed)
[place] for Infectious Disease    Date of Admission:  02/04/2020      ID: Kelsey Rodriguez is a 29 y.o. female with  Principal Problem:   CNS toxoplasmosis, suspected  Active Problems:   HIV (human immunodeficiency virus infection) ([place])   AIDS (acquired immune deficiency syndrome) ([place])   Chest pain   Intractable headache   Intracranial mass    Subjective: Afebrile, no headache, no recalls symptoms of seizure, no difficulty with eating, denies abdominal pain  Medications:  . [START ON 02/17/2020] clindamycin  600 mg Oral Q6H  . [START ON 02/17/2020] dexamethasone  4 mg Oral Q8H  . dexamethasone  4 mg Oral Q6H  . enoxaparin (LOVENOX) injection  40 mg Subcutaneous Q24H  . FLUoxetine  20 mg Oral Daily  . levETIRAcetam  500 mg Oral BID  . Pyrimethamine-Leucovorin  50 mg Oral Daily   And  . Pyrimethamine-Leucovorin  25 mg Oral Q2000  . sulfamethoxazole-trimethoprim  1 tablet Oral Daily    Objective: Vital signs in last 24 hours: Temp:  [98 F (36.7 C)] 98 F (36.7 C) (03/03 2249) Pulse Rate:  [91] 91 (03/03 2249) BP: (121)/(78) 121/78 (03/03 2249) SpO2:  [100 %] 100 % (03/03 2249)   Physical Exam  Constitutional:  oriented to person, place, and time. appears well-developed and well-nourished. No distress.  HENT: [place]/AT, PERRLA, no scleral icterus Mouth/Throat: Oropharynx is clear and moist. No oropharyngeal exudate.  Cardiovascular: Normal rate, regular rhythm and normal heart sounds. Exam reveals no gallop and no friction rub.  No murmur heard.  Pulmonary/Chest: Effort normal and breath sounds normal. No respiratory distress.  has no wheezes.  Neck = supple, no nuchal rigidity Abdominal: Soft. Bowel sounds are normal.  exhibits no distension. There is no tenderness.  Lymphadenopathy: no cervical adenopathy. No axillary adenopathy Neurological: alert and oriented to person, place, and time.  Skin: Skin is warm and dry. No rash noted. No erythema.  Psychiatric: a  normal mood and affect.  behavior is normal.    Lab Results Recent Labs    02/14/20 0519 02/14/20 0519 02/15/20 0916 02/16/20 0123  WBC 3.5*  --   --  8.4  HGB 12.9  --   --  12.1  HCT 38.2  --   --  35.6*  NA 136   < > 134* 136  K 3.8   < > 4.1 4.5  CL 105   < > 101 105  CO2 20*   < > 21* 20*  BUN 9   < > 8 12  CREATININE 1.35*   < > 0.91 0.85   < > = values in this interval not displayed.   Liver Panel Recent Labs    02/14/20 0519 02/15/20 0916  PROT 8.4* 9.1*  ALBUMIN 4.0 4.1  AST 28 27  ALT 24 25  ALKPHOS 95 94  BILITOT 0.7 0.4    Microbiology: reviewed Studies/Results: No results found.   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 She is doing very well with IV clindamycin. We talked about starting PO clindamycin tomorrow. She is open to this. Worried about pill burden overall.  - will drop dexamethasone dose to 4 mg Q8h 3/05 then titrate to Q12.   Seizure =due to CNS toxo, continue on keppra 500mg  bid - duration to be determined   oi proph = continue on bactrim plus weekly azithromycin  hiv disease = will plan to start  back on symtuza roughly after she has been on treatment for 2 weeks for CNS toxo  aki = back to baseline Lab Results  Component Value Date   CREATININE 0.85 02/16/2020   CREATININE 0.91 02/15/2020   CREATININE 1.35 (H) 02/14/2020   Depression = suicidal with sitter requirement this admission - cleared by psych on re-eval for outpatient management. She still intends on proceeding with this plan.    Janene Madeira, MSN, NP-C [facility] for Infectious Disease [email or web address] Pager: [phone] Office: [place]: [phone]   02/16/2020, 5:30 PM

## 2020-02-17 LAB — CBC
HCT: 37.3 % (ref 36.0–46.0)
Hemoglobin: 12.4 g/dL (ref 12.0–15.0)
MCH: 31 pg (ref 26.0–34.0)
MCHC: 33.2 g/dL (ref 30.0–36.0)
MCV: 93.3 fL (ref 80.0–100.0)
Platelets: 225 10*3/uL (ref 150–400)
RBC: 4 MIL/uL (ref 3.87–5.11)
RDW: 13.7 % (ref 11.5–15.5)
WBC: 7.6 10*3/uL (ref 4.0–10.5)
nRBC: 0 % (ref 0.0–0.2)

## 2020-02-17 LAB — BASIC METABOLIC PANEL
Anion gap: 11 (ref 5–15)
BUN: 8 mg/dL (ref 6–20)
CO2: 21 mmol/L — ABNORMAL LOW (ref 22–32)
Calcium: 9.8 mg/dL (ref 8.9–10.3)
Chloride: 106 mmol/L (ref 98–111)
Creatinine, Ser: 0.9 mg/dL (ref 0.44–1.00)
GFR calc Af Amer: >60 mL/min (ref >60)
GFR calc non Af Amer: >60 mL/min (ref >60)
Glucose, Bld: 124 mg/dL — ABNORMAL HIGH (ref 70–99)
Potassium: 5.2 mmol/L — ABNORMAL HIGH (ref 3.5–5.1)
Sodium: 138 mmol/L (ref 135–145)

## 2020-02-17 MED ORDER — DEXAMETHASONE 4 MG PO TABS
4.0000 mg | ORAL_TABLET | Freq: Once | ORAL | Status: AC
  Administered 2020-02-19: 4 mg via ORAL
  Filled 2020-02-17: qty 1

## 2020-02-17 MED ORDER — DEXAMETHASONE 4 MG PO TABS
4.0000 mg | ORAL_TABLET | Freq: Two times a day (BID) | ORAL | Status: AC
  Administered 2020-02-18 (×2): 4 mg via ORAL
  Filled 2020-02-17 (×2): qty 1

## 2020-02-17 NOTE — Progress Notes (Signed)
    [place] [facility] for Infectious Disease    Date of Admission:  02/04/2020      ID: Kelsey Rodriguez is a 29 y.o. female with  Principal Problem:   CNS toxoplasmosis, suspected  Active Problems:   HIV (human immunodeficiency virus infection) ([place])   Chest pain   AIDS (acquired immune deficiency syndrome) ([facility])   Intractable headache   Intracranial mass    Subjective: Tolerating oral medications thus far. Denies headache. No recent seizures  Medications:  . clindamycin  600 mg Oral Q6H  . dexamethasone  4 mg Oral Q8H  . [START ON 02/18/2020] dexamethasone  4 mg Oral Q12H  . [START ON 02/19/2020] dexamethasone  4 mg Oral Once  . enoxaparin (LOVENOX) injection  40 mg Subcutaneous Q24H  . FLUoxetine  20 mg Oral Daily  . levETIRAcetam  500 mg Oral BID  . Pyrimethamine-Leucovorin  50 mg Oral Daily   And  . Pyrimethamine-Leucovorin  25 mg Oral Q2000  . sulfamethoxazole-trimethoprim  1 tablet Oral Daily    Objective: Vital signs in last 24 hours: Temp:  [98.2 F (36.8 C)-98.7 F (37.1 C)] 98.7 F (37.1 C) (03/05 0914) Pulse Rate:  [73-80] 80 (03/05 0914) Resp:  [18-20] 18 (03/05 0914) BP: (97-118)/(68-85) 118/85 (03/05 0914) SpO2:  [99 %] 99 % (03/05 0914)  Physical Exam  Constitutional:  oriented to person, place, and time. appears well-developed and well-nourished. No distress.  HENT: [name]/AT, PERRLA, no scleral icterus Mouth/Throat: Oropharynx is clear and moist. No oropharyngeal exudate.  Cardiovascular: Normal rate, regular rhythm and normal heart sounds. Exam reveals no gallop and no friction rub.  No murmur heard.  Pulmonary/Chest: Effort normal and breath sounds normal. No respiratory distress.  has no wheezes.  Neck = supple, no nuchal rigidity Abdominal: Soft. Bowel sounds are normal.  exhibits no distension. There is no tenderness.  Lymphadenopathy: no cervical adenopathy. No axillary adenopathy Neurological: alert and oriented to person, place, and time.  Skin:  Skin is warm and dry. No rash noted. No erythema.  Psychiatric: a normal mood and affect.  behavior is normal.    Lab Results Recent Labs    02/16/20 0123 02/17/20 0219  WBC 8.4 7.6  HGB 12.1 12.4  HCT 35.6* 37.3  NA 136 138  K 4.5 5.2*  CL 105 106  CO2 20* 21*  BUN 12 8  CREATININE 0.85 0.90   Liver Panel Recent Labs    02/15/20 0916  PROT 9.1*  ALBUMIN 4.1  AST 27  ALT 25  ALKPHOS 94  BILITOT 0.4    Microbiology: reviewed Studies/Results: No results found.   Assessment/Plan: CNS toxo = currently now on oral clindamycin, pyremithamine and leucovorin plus dexamethasone. We are starting to taper her dexamethasone. Would like to repeat MRI on Monday Tuesday  hiv disease= will start back on ART of symtuza plus tivicay tomorrow  oi proph = continue on bactrim ds daily  Hx of non-adherence/pill burden = will try to continue to minimize pill burden (taper of steroids) over the next few days but while she is getting treatment for CNS toxo - she does have to take more medication  Depression = agree that she still needs op counseling. Continue on fluoxetine  Seizure = currently on low dose keppra. Plan to continue at this dosage  [facility] for Infectious Diseases Cell: [phone] Pager: [phone]  02/17/2020, 4:35 PM

## 2020-02-17 NOTE — Progress Notes (Signed)
Family Medicine Teaching Service Daily Progress Note Intern Pager: [phone]  Patient name: Kelsey Rodriguez Medical record number: [id number] Date of birth: [birth year] Age: 29 y.o. Gender: female  Primary Care Provider: Gerlene Fee, DO Consultants: ID Code Status: FULL  Pt Overview and Major Events to Date:  2/20 Admitted, ID consulted   Assessment and Plan: Johni Bih [name] is a 29 y.o. female presenting with acute-on-chronic HA. 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  #Acute on chronic headache likely secondary to prior toxoplasmosis CNS lesion w/o mass effect, improving Reports no headaches over the last 7 days. . No neurologic deficits.  -ID consulted, appreciate recommendations: ABX x2 weeks, plan for repeat MRI on 3/8. -IV Clindamycin 600mg  q6h (started 02/25-03/04) -We will transition to p.o. clindamycin today -PO Pyrimethamine 200 mg X 1 Then 75 mg daily (started 2/25-) -PO Leucovorin 30 mg daily (started 2/25-) -IV Azithromycin 1200mg  weekly (last had 02/24, next dose March 3) -PO Bactrim 400-80mg  QD (2/26-) -Tylenol, naproxen as needed  -Per ID plan to restart HIV therapy tomorrow.  Would prefer to observe for at least 24 hours to ensure patient is able to tolerate p.o. meds  New onset seizures, improved Initially patient had episode where she became nonresponsive and had a witnessed seizure.  Neurology was consulted. EEG showed cortical dysfunction in the right temporal region which neurology believes is most likely related to the underlying lesion in that area. . -Neurology consulted, recommendations  appreciated -Loaded with Keppra 1.5 g -Start Keppra 500 mg twice daily, continue at this time -Seizure precautions -2 mg Ativan as needed for seizures greater than 2 minutes -Dexamethasone taper initiated  HIV  AIDS, poorly controlled, unchanged Absolute lymphocytes count 1190.  CD4 count was 36 on 11/2019.   On ART prior to admission.  Endorses compliance. 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 CD4 count <35. Lymphocyte count 847. Viral load 131K. -ID recommendations appreciated: hold home ART d/t IRIS, plan to restart either today or tomorrow -Abx as above  Chronic MSK pain, improved Does not currently endorse any pain at this time. -Multimodality pain regimen including Tylenol, naproxen, lidocaine, K pad as needed -PT evaluation  Moderate Depression PHQ9 score of 13. Moderate depression with several days of suicidal ideation prior to admission.  Suicidal ideation has resolved.  Psych was consulted and does not recommend inpatient psychiatric treatment at this time. -Prozac 20 mg daily  FEN/GI:Regular diet, patient complaining of constipation so MiraLAX x1 was given Prophylaxis:lovenox  Disposition: Patient has transition to oral antibiotics.  Would like to monitor after restarting HIV medications.  Infectious disease would also like repeat head MRI on Monday 3/8  Subjective:  Patient is doing well this morning.  Has had no strokes overnight.  Reports that she had her first dose of clindamycin oral this morning and is tolerating it well.  Would like to go home if possible.  Objective: Temp:  [98.2 F (36.8 C)-98.7 F (37.1 C)] 98.7 F (37.1 C) (03/05 0914) Pulse Rate:  [73-80] 80 (03/05 0914) Resp:  [18-20] 18 (03/05 0914) BP: (97-118)/(68-85) 118/85 (03/05 0914) SpO2:  [99 %] 99 % (03/05 0914)  Physical Exam: General: Sitting up in bed  in no acute distress. HEENT: Tongue pain has resolved, patient reports still minor numbness on lip  where she bit it during her seizure. Cardiac: Regular rate in the 70s, regular rhythm, no murmurs appreciated Respiratory: Normal work of breathing, clear to auscultation bilaterally Extremities: No edema or cyanosis. Skin: Warm and dry, no rashes noted  Neuro: alert and oriented, no focal deficits Psych: normal affect, denies any SI or HI at this time  Laboratory: Recent Labs  Lab 02/14/20 0519 02/16/20 0123 02/17/20 0219  WBC 3.5* 8.4 7.6  HGB 12.9 12.1 12.4  HCT 38.2 35.6* 37.3  PLT 161 199 225   Recent Labs  Lab 02/12/20 0439 02/12/20 0439 02/14/20 0519 02/14/20 0519 02/15/20 0916 02/16/20 0123 02/17/20 0219  NA 136   < > 136   < > 134* 136 138  K 4.3   < > 3.8   < > 4.1 4.5 5.2*  CL 107   < > 105   < > 101 105 106  CO2 19*   < > 20*   < > 21* 20* 21*  BUN 8   < > 9   < > 8 12 8   CREATININE 1.03*   < > 1.35*   < > 0.91 0.85 0.90  CALCIUM 9.4   < > 9.3   < > 10.2 9.9 9.8  PROT 8.0  --  8.4*  --  9.1*  --   --   BILITOT 0.7  --  0.7  --  0.4  --   --   ALKPHOS 78  --  95  --  94  --   --   ALT 22  --  24  --  25  --   --   AST 23  --  28  --  27  --   --   GLUCOSE 89   < > 89   < > 127* 136* 124*   < > = values in this interval not displayed.   Imaging/Diagnostic Tests: No results found.  Gifford Shave, MD 02/17/2020, 2:22 PM PGY-1, [name] Intern pager: [phone], text pages welcome

## 2020-02-18 LAB — BASIC METABOLIC PANEL
Anion gap: 11 (ref 5–15)
BUN: 7 mg/dL (ref 6–20)
CO2: 23 mmol/L (ref 22–32)
Calcium: 9.3 mg/dL (ref 8.9–10.3)
Chloride: 104 mmol/L (ref 98–111)
Creatinine, Ser: 0.82 mg/dL (ref 0.44–1.00)
GFR calc Af Amer: >60 mL/min (ref >60)
GFR calc non Af Amer: >60 mL/min (ref >60)
Glucose, Bld: 110 mg/dL — ABNORMAL HIGH (ref 70–99)
Potassium: 4.2 mmol/L (ref 3.5–5.1)
Sodium: 138 mmol/L (ref 135–145)

## 2020-02-18 MED ORDER — DARUN-COBIC-EMTRICIT-TENOFAF 800-150-200-10 MG PO TABS
1.0000 | ORAL_TABLET | Freq: Every day | ORAL | Status: DC
  Administered 2020-02-20: 1 via ORAL
  Filled 2020-02-18 (×3): qty 1

## 2020-02-18 MED ORDER — PANTOPRAZOLE SODIUM 40 MG PO TBEC
40.0000 mg | DELAYED_RELEASE_TABLET | Freq: Two times a day (BID) | ORAL | Status: DC
  Administered 2020-02-18 – 2020-02-20 (×5): 40 mg via ORAL
  Filled 2020-02-18 (×4): qty 1

## 2020-02-18 MED ORDER — DOLUTEGRAVIR SODIUM 50 MG PO TABS
50.0000 mg | ORAL_TABLET | Freq: Every day | ORAL | Status: DC
  Administered 2020-02-18 – 2020-02-20 (×3): 50 mg via ORAL
  Filled 2020-02-18 (×3): qty 1

## 2020-02-18 MED ORDER — FAMOTIDINE 20 MG PO TABS
20.0000 mg | ORAL_TABLET | Freq: Once | ORAL | Status: AC
  Administered 2020-02-18: 20 mg via ORAL
  Filled 2020-02-18: qty 1

## 2020-02-18 NOTE — Progress Notes (Signed)
Family Medicine Teaching Service Daily Progress Note Intern Pager: [phone]  Patient name: Kelsey Rodriguez Medical record number: [id number] Date of birth: 1991/03/17 Age: 29 y.o. Gender: female  Primary Care Provider: Gerlene Fee, DO Consultants: ID, neurology Code Status: FULL  Pt Overview and Major Events to Date:  2/20 Admitted, ID consulted  3/1 EEG: cortical dysfunction in right temporal region likely secondary to underlying lesion. No seizures or epileptiform discharges were seen throughout the recording  Assessment and Plan: Kelsey Rodriguez is a 29 y.o. female presenting with acute-on-chronic HA. 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  #Acute on chronic headache likely secondary to prior toxoplasmosis CNS lesion w/o mass effect, improving Denies any headaches. Tolerating PO meds. Started on Protonix and Pepcid for GI upset given pill burden with improvement in symptoms.  -ID consulted, appreciate recommendations: ABX x2 weeks, plan for repeat MRI on 3/8. - S/p IV Clindamycin (2/25-3/4), continue PO Clindamycin (3/5-) -PO Pyrimethamine 200 mg X 1, then 75 mg daily (2/25-) -PO Leucovorin 30 mg daily (2/25-) -IV Azithromycin 1200mg  weekly (last had 02/24, next dose March 3) -PO Bactrim 400-80mg  QD (2/26-) -Tylenol, naproxen as needed  - continue oral HIV therapy per ID - Continue Protonix 40mg  BID   New onset seizures, resolved No further seizure like activity -Neurology consulted, recommendations appreciated - S/p Keppra load of 1.5g, continue Keppra 500mg  BID  -Seizure precautions -2 mg Ativan as needed for  seizures >2 minutes - Continue Dexamethasone taper  HIV  AIDS, poorly controlled, unchanged Absolute lymphocytes count 1190.  CD4 count was 36 on 11/2019.   On ART prior to admission.  Endorses compliance. 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 CD4 count <35. Lymphocyte count 847. Viral load 131K. -ID consulted, appreciate recs - Continue oral HIV therapy per ID - Antibiotics as above  Chronic MSK pain, improved Does not currently endorse any pain at this time. -Multimodality pain regimen including Tylenol, naproxen, lidocaine, K pad as needed  Moderate Depression PHQ9 score of 13. Moderate depression with several days of suicidal ideation prior to admission.  Suicidal ideation has resolved.  Psych was consulted and does not recommend inpatient psychiatric treatment at this time. -Prozac 20 mg daily  FEN/GI:Regular diet, Miralax PRN Prophylaxis:lovenox  Disposition: pending repeat MRI on 3/8 and ID recs  Subjective:  Patient reports doing well this morning.  Sleeping comfortably upon entrance to exam.  Notes much improvement in her abdominal pain.  She is tolerating p.o. well.  No acute events overnight.  Objective: Temp:  [98.2 F (36.8 C)-98.5 F (36.9 C)] 98.2 F (36.8 C) (03/06 2000) Pulse Rate:  [69-89] 75 (03/06 2000) Resp:  [16-19] 16 (03/06 2000) BP: (110-121)/(74-88) 110/74 (03/06 2000) SpO2:  [100 %] 100 % (03/06 2000)  Physical Exam: General: Pleasant young African-American female, well nourished, well developed, in no acute distress with non-toxic appearance, sitting comfortably in bed CV: regular rate 60-70's, regular rhythm without murmurs, rubs, or gallops Lungs: clear to auscultation bilaterally with normal work of breathing Abdomen: soft, non-tender, non-distended, normoactive bowel sounds Skin: warm, dry Extremities: warm and well perfused  Laboratory: Recent Labs  Lab 02/16/20 0123 02/17/20 0219 02/19/20 0314  WBC  8.4 7.6 6.2  HGB 12.1 12.4 12.6  HCT 35.6* 37.3 38.5  PLT 199 225 239   Recent Labs  Lab 02/14/20 0519 02/14/20 0519 02/15/20 0916 02/16/20 0123 02/17/20 0219 02/18/20 0610 02/19/20 0314  NA 136   < > 134*   < > 138 138 136  K 3.8   < > 4.1   < > 5.2* 4.2 4.4  CL 105   < > 101   < > 106 104 106  CO2 20*   < > 21*   < > 21* 23 21*  BUN 9   < > 8   < > 8 7 9   CREATININE 1.35*   < > 0.91   < > 0.90 0.82 0.79  CALCIUM 9.3   < > 10.2   < > 9.8 9.3 9.3  PROT 8.4*  --  9.1*  --   --   --   --   BILITOT 0.7  --  0.4  --   --   --   --   ALKPHOS 95  --  94  --   --   --   --   ALT 24  --  25  --   --   --   --   AST 28  --  27  --   --   --   --   GLUCOSE 89   < > 127*   < > 124* 110* 114*   < > = values in this interval not displayed.   Imaging/Diagnostic Tests: No results found.  Mina [place], DO 02/19/2020, 6:13 AM PGY-2, [name] Intern pager: [phone], text pages welcome

## 2020-02-18 NOTE — Progress Notes (Signed)
Family Medicine Teaching Service Daily Progress Note Intern Pager: [phone]  Patient name: Kelsey Rodriguez Medical record number: [id number] Date of birth: 24-May-1991 Age: 29 y.o. Gender: female  Primary Care Provider: Gerlene Fee, DO Consultants: ID Code Status: FULL  Pt Overview and Major Events to Date:  2/20 Admitted, ID consulted   Assessment and Plan: Kelsey Rodriguez is a 29 y.o. female presenting with acute-on-chronic HA. 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  #Acute on chronic headache likely secondary to prior toxoplasmosis CNS lesion w/o mass effect, improving Reports no headaches for over a week. . No neurologic deficits.  -ID consulted, appreciate recommendations: ABX x2 weeks, plan for repeat MRI on 3/8. -IV Clindamycin 600mg  q6h (started 02/25-03/04) -PO clindamycin (started 03/05-) -PO Pyrimethamine 200 mg X 1 Then 75 mg daily (started 2/25-) -PO Leucovorin 30 mg daily (started 2/25-) -IV Azithromycin 1200mg  weekly (last had 02/24, next dose March 3) -PO Bactrim 400-80mg  QD (2/26-) -Tylenol, naproxen as needed  -Oral HIV therapy restarted.  Patient having difficulty with pill burden as well as GI upset. -We will consult pharmacy on best medication to help with GI upset that will not decrease absorption of patient's medications  New onset seizures, resolved Initially patient had episode where she became nonresponsive and had a witnessed seizure.  Neurology was consulted. EEG showed cortical dysfunction in the right temporal region which neurology believes is most likely related to the underlying lesion in that  area. . -Neurology consulted, recommendations appreciated -Loaded with Keppra 1.5 g -Start Keppra 500 mg twice daily, continue at this time -Seizure precautions -2 mg Ativan as needed for seizures greater than 2 minutes -Dexamethasone taper initiated  HIV  AIDS, poorly controlled, unchanged Absolute lymphocytes count 1190.  CD4 count was 36 on 11/2019.   On ART prior to admission.  Endorses compliance. 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 CD4 count <35. Lymphocyte count 847. Viral load 131K. -ID recommendations appreciated: Restart ART. -Abx as above  Chronic MSK pain, improved Does not currently endorse any pain at this time. -Multimodality pain regimen including Tylenol, naproxen, lidocaine, K pad as needed  Moderate Depression PHQ9 score of 13. Moderate depression with several days of suicidal ideation prior to admission.  Suicidal ideation has resolved.  Psych was consulted and does not recommend inpatient psychiatric treatment at this time. -Prozac 20 mg daily  FEN/GI:Regular diet, patient complaining of constipation so MiraLAX x1 was given Prophylaxis:lovenox  Disposition: Patient has transition to oral antibiotics.  Would like to monitor after restarting HIV medications.  Infectious disease would also like repeat head MRI on Monday 3/8  Subjective:  Patient reports she is doing okay this morning.  She says that last night she was having issues with heartburn as well as abdominal pain.  Denies any nausea or vomiting but endorses considerable amounts of burping.  Patient states that she is already having difficulty taking all of these pills by mouth because of the GI upset.  I discussed with her the importance of continuing to take her meds as prescribed.  Objective: Temp:  [98.7 F (37.1 C)-98.8 F (37.1 C)] 98.8 F (37.1 C) (03/05 2100) Pulse Rate:  [80-90] 90 (03/05  2100) Resp:  [18] 18 (03/05 2100) BP: (118-125)/(75-85) 125/75 (03/05  2100) SpO2:  [99 %-100 %] 100 % (03/05 2100)  Physical Exam: General: Resting in bed comfortably, no acute distress HEENT: Atraumatic, normocephalic, tongue and lip pain have resolved Cardiac: Regular rate in the 60s to 70s, regular rhythm, no murmurs appreciated Respiratory: Normal work of breathing, clear to auscultation bilaterally Extremities: No edema or cyanosis. Skin: Warm and dry, no rashes noted  Neuro: alert and oriented, no focal deficits Psych: normal affect, denies any SI or HI at this time  Laboratory: Recent Labs  Lab 02/14/20 0519 02/16/20 0123 02/17/20 0219  WBC 3.5* 8.4 7.6  HGB 12.9 12.1 12.4  HCT 38.2 35.6* 37.3  PLT 161 199 225   Recent Labs  Lab 02/12/20 0439 02/12/20 0439 02/14/20 0519 02/14/20 0519 02/15/20 0916 02/16/20 0123 02/17/20 0219  NA 136   < > 136   < > 134* 136 138  K 4.3   < > 3.8   < > 4.1 4.5 5.2*  CL 107   < > 105   < > 101 105 106  CO2 19*   < > 20*   < > 21* 20* 21*  BUN 8   < > 9   < > 8 12 8   CREATININE 1.03*   < > 1.35*   < > 0.91 0.85 0.90  CALCIUM 9.4   < > 9.3   < > 10.2 9.9 9.8  PROT 8.0  --  8.4*  --  9.1*  --   --   BILITOT 0.7  --  0.7  --  0.4  --   --   ALKPHOS 78  --  95  --  94  --   --   ALT 22  --  24  --  25  --   --   AST 23  --  28  --  27  --   --   GLUCOSE 89   < > 89   < > 127* 136* 124*   < > = values in this interval not displayed.   Imaging/Diagnostic Tests: No results found.  Gifford Shave, MD 02/18/2020, 6:25 AM PGY-1, [place] Intern pager: [phone], text pages welcome

## 2020-02-19 LAB — BASIC METABOLIC PANEL
Anion gap: 9 (ref 5–15)
BUN: 9 mg/dL (ref 6–20)
CO2: 21 mmol/L — ABNORMAL LOW (ref 22–32)
Calcium: 9.3 mg/dL (ref 8.9–10.3)
Chloride: 106 mmol/L (ref 98–111)
Creatinine, Ser: 0.79 mg/dL (ref 0.44–1.00)
GFR calc Af Amer: >60 mL/min (ref >60)
GFR calc non Af Amer: >60 mL/min (ref >60)
Glucose, Bld: 114 mg/dL — ABNORMAL HIGH (ref 70–99)
Potassium: 4.4 mmol/L (ref 3.5–5.1)
Sodium: 136 mmol/L (ref 135–145)

## 2020-02-19 LAB — CBC
HCT: 38.5 % (ref 36.0–46.0)
Hemoglobin: 12.6 g/dL (ref 12.0–15.0)
MCH: 30.7 pg (ref 26.0–34.0)
MCHC: 32.7 g/dL (ref 30.0–36.0)
MCV: 93.7 fL (ref 80.0–100.0)
Platelets: 239 10*3/uL (ref 150–400)
RBC: 4.11 MIL/uL (ref 3.87–5.11)
RDW: 13.4 % (ref 11.5–15.5)
WBC: 6.2 10*3/uL (ref 4.0–10.5)
nRBC: 0 % (ref 0.0–0.2)

## 2020-02-20 ENCOUNTER — Inpatient Hospital Stay (HOSPITAL_COMMUNITY): Payer: Medicaid Other

## 2020-02-20 LAB — CBC
HCT: 36.6 % (ref 36.0–46.0)
Hemoglobin: 12.1 g/dL (ref 12.0–15.0)
MCH: 30.7 pg (ref 26.0–34.0)
MCHC: 33.1 g/dL (ref 30.0–36.0)
MCV: 92.9 fL (ref 80.0–100.0)
Platelets: 248 10*3/uL (ref 150–400)
RBC: 3.94 MIL/uL (ref 3.87–5.11)
RDW: 13.3 % (ref 11.5–15.5)
WBC: 7.5 10*3/uL (ref 4.0–10.5)
nRBC: 0 % (ref 0.0–0.2)

## 2020-02-20 LAB — COMPREHENSIVE METABOLIC PANEL
ALT: 18 U/L (ref 0–44)
AST: 18 U/L (ref 15–41)
Albumin: 3.4 g/dL — ABNORMAL LOW (ref 3.5–5.0)
Alkaline Phosphatase: 64 U/L (ref 38–126)
Anion gap: 9 (ref 5–15)
BUN: 12 mg/dL (ref 6–20)
CO2: 23 mmol/L (ref 22–32)
Calcium: 9.1 mg/dL (ref 8.9–10.3)
Chloride: 105 mmol/L (ref 98–111)
Creatinine, Ser: 1.06 mg/dL — ABNORMAL HIGH (ref 0.44–1.00)
GFR calc Af Amer: >60 mL/min (ref >60)
GFR calc non Af Amer: >60 mL/min (ref >60)
Glucose, Bld: 98 mg/dL (ref 70–99)
Potassium: 4.4 mmol/L (ref 3.5–5.1)
Sodium: 137 mmol/L (ref 135–145)
Total Bilirubin: 0.3 mg/dL (ref 0.3–1.2)
Total Protein: 7.4 g/dL (ref 6.5–8.1)

## 2020-02-20 MED ORDER — SULFAMETHOXAZOLE-TRIMETHOPRIM 400-80 MG PO TABS: 1.0000 | ORAL_TABLET | Freq: Every day | ORAL | 0 refills | Status: AC

## 2020-02-20 MED ORDER — PANTOPRAZOLE SODIUM 40 MG PO TBEC: 40.0000 mg | DELAYED_RELEASE_TABLET | Freq: Every day | ORAL | 0 refills | Status: DC

## 2020-02-20 MED ORDER — PYRIMETHAMINE 25 MG PO TABS: 75.0000 mg | ORAL_TABLET | Freq: Every day | ORAL | 0 refills | Status: AC

## 2020-02-20 MED ORDER — AZITHROMYCIN 250 MG PO TABS
500.0000 mg | ORAL_TABLET | Freq: Once | ORAL | Status: AC
  Administered 2020-02-20: 500 mg via ORAL
  Filled 2020-02-20: qty 2

## 2020-02-20 MED ORDER — DEXTROSE 5 % IV SOLN
1200.0000 mg | INTRAVENOUS | Status: DC
  Administered 2020-02-20: 1200 mg via INTRAVENOUS
  Filled 2020-02-20: qty 1200

## 2020-02-20 MED ORDER — HYDROCORTISONE 1 % EX OINT: 1.0000 "application " | TOPICAL_OINTMENT | Freq: Two times a day (BID) | CUTANEOUS | 0 refills | Status: DC

## 2020-02-20 MED ORDER — ACETAMINOPHEN 500 MG PO TABS: 1000.0000 mg | ORAL_TABLET | Freq: Four times a day (QID) | ORAL | 0 refills | Status: DC | PRN

## 2020-02-20 MED ORDER — FLUOXETINE HCL 20 MG PO CAPS: 20.0000 mg | ORAL_CAPSULE | Freq: Every day | ORAL | 0 refills | Status: DC

## 2020-02-20 MED ORDER — LEVETIRACETAM 500 MG PO TABS: 500.0000 mg | ORAL_TABLET | Freq: Two times a day (BID) | ORAL | 0 refills | Status: DC

## 2020-02-20 MED ORDER — DOLUTEGRAVIR SODIUM 50 MG PO TABS: 50.0000 mg | ORAL_TABLET | Freq: Every day | ORAL | 0 refills | Status: AC

## 2020-02-20 MED ORDER — GADOBUTROL 1 MMOL/ML IV SOLN
7.5000 mL | Freq: Once | INTRAVENOUS | Status: AC | PRN
  Administered 2020-02-20: 7.5 mL via INTRAVENOUS

## 2020-02-20 MED ORDER — LEUCOVORIN CALCIUM 25 MG PO TABS: 25.0000 mg | ORAL_TABLET | Freq: Every day | ORAL | 0 refills | Status: AC

## 2020-02-20 MED ORDER — HYDROCORTISONE ACETATE 25 MG RE SUPP: 25.0000 mg | Freq: Two times a day (BID) | RECTAL | 0 refills | Status: DC

## 2020-02-20 MED ORDER — CLINDAMYCIN HCL 300 MG PO CAPS: 600.0000 mg | ORAL_CAPSULE | Freq: Four times a day (QID) | ORAL | 0 refills | Status: AC

## 2020-02-20 MED ORDER — SYMTUZA 800-150-200-10 MG PO TABS: 1.0000 | ORAL_TABLET | Freq: Every day | ORAL | 0 refills | Status: DC

## 2020-02-20 NOTE — Discharge Instructions (Addendum)
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 Unfortunately this can happen in patients with HIV. You were seen by the infectious disease doctors who recommended a group of antibiotics. After starting these antibiotics the repeat MRI scan of your brain showed that the infection has improved. You are now ready to go home! The infectious disease doctors recommended:  1. Continue Symtuza and Tivicay for ART therapy and Bactrim for OI prophylaxis. 2. Continue Keppra for seizures 3. Continue Pyrimethamine-leucovorin and clindamycin for toxoplasmosis infection. 4. Follow up with Dr. Baxter Flattery on 02/29/20 @ 2:45pm  Please follow up at [facility] on 3/11 at 09:25am   If in the meantime you develop worsening of headaches, change in vision, fevers, vomiting and are unable to keep down your medications please go to the ER immediately.  Best wishes and take care,  Family Medicine Team

## 2020-02-20 NOTE — Progress Notes (Signed)
ID Pharmacy Note   All of Kelsey Rodriguez's prescriptions (Pyrimethamine-Leucovorin, Clindamycin, Symtuza, Tivicay, Bactrim SS, Keppra, Fluoxetine and Pantoprazole) were sent to the [facility] and filled. I provided her with pre-filled pill boxes for the following week and scheduled her to meet with our ID pharmacist in the clinic, Cassie Kuppelweiser on March 17th prior to her appointment with Dr. Baxter Flattery to re-fill these pill boxes and ensure that the patient is not having any issues with her medication.    The medications are in the patient room and nurse and patient are aware. I counseled Kelsey Rodriguez on the pillboxes and reinforced the importance of taking her medications for toxoplasmosis.    Jimmy Footman, PharmD, BCPS, BCIDP Infectious Diseases Clinical Pharmacist Phone: [phone] 02/20/2020 4:42 PM

## 2020-02-20 NOTE — Progress Notes (Signed)
Patient discharged from the unit. Patient given all the discharge instructions and reports no question. Patient took all her medications provided by [facility] with her upon discharge.

## 2020-02-20 NOTE — Progress Notes (Signed)
Family Medicine Teaching Service Daily Progress Note Intern Pager: [phone]  Patient name: Kelsey Rodriguez Medical record number: [id number] Date of birth: May 17, 1991 Age: 29 y.o. Gender: female  Primary Care Provider: Gerlene Fee, DO Consultants: ID, neurology Code Status: FULL  Pt Overview and Major Events to Date:  2/20 Admitted, ID consulted  3/1 EEG: cortical dysfunction in right temporal region likely secondary to underlying lesion. No seizures or epileptiform discharges were seen throughout the recording  Assessment and Plan: Kelsey Rodriguez is a 29 y.o. female presenting with acute-on-chronic HA. 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  #Acute on chronic headache likely secondary to prior toxoplasmosis CNS lesion w/o mass effect, improving Doing well this morning, denies headache. ID have seen patient this morning and okay for her to be discharged. Started on Protonix and Pepcid for GI upset given pill burden with improvement in symptoms.  -ID consulted, appreciate recommendations: ABX x2 weeks, plan for repeat MRI today -S/p IV Clindamycin (2/25-3/4), continue PO Clindamycin (3/5-) -PO Pyrimethamine 200 mg X 1, then 75 mg daily (2/25-) -PO Leucovorin 30 mg daily (2/25-) -IV Azithromycin 1200mg  weekly (last had 02/24, next dose March 3) -PO Bactrim 400-80mg  QD (2/26-) -Tylenol, naproxen as needed  - continue oral HIV therapy per ID - Continue Protonix 40mg  once daily on discharge  New onset seizures, resolved No further seizure like activity. -Neurology consulted, recommendations appreciated - S/p Keppra load of  1.5g, continue Keppra 500mg  BID  -Seizure precautions -2 mg Ativan as needed for seizures >2 minutes - Continue Dexamethasone taper  HIV  AIDS, poorly controlled, unchanged Absolute lymphocytes count 1190.  CD4 count was 36 on 11/2019.   On ART prior to admission.  Endorses compliance. 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 CD4 count <35. Lymphocyte count 847. Viral load 131K. -ID consulted, appreciate recs - Continue oral HIV therapy per ID - Antibiotics as above  Chronic MSK pain, improved Stable -Multimodality pain regimen including Tylenol, naproxen, lidocaine, K pad as needed  Moderate Depression PHQ9 score of 13. Moderate depression with several days of suicidal ideation prior to admission.  Suicidal ideation has resolved.  Psych was consulted and does not recommend inpatient psychiatric treatment at this time. -Prozac 20 mg daily  FEN/GI:Regular diet, Miralax PRN Prophylaxis:lovenox  Disposition: discharge home per ID   Subjective:  Feels well, denies headache. Has not have heartburn for 3 days. I explained that I will reduce her PPI to once daily and her PCP can d/c if necessary.   Objective: Temp:  [98.4 F (36.9 C)-98.6 F (37 C)] 98.4 F (36.9 C) (03/07 2100) Pulse Rate:  [78-84] 84 (03/07 2100) Resp:  [20] 20 (03/07 2100) BP: (106-118)/(72-76) 106/72 (03/07 2100) SpO2:  [100 %] 100 % (03/07 2100) Weight:  [78.6 kg-82.9 kg] 82.9 kg (03/08 0300)  Physical Exam:  General: Pleasant, and cheerful 30 yr old female, no acute distress Cardio: Normal S1 and S2, RRR, No murmurs or rubs.   Pulm: CTAB, normal WOB. Abdomen: Bowel sounds normal. Abdomen soft and non-tender.  Extremities: No peripheral edema. Warm/ well perfused.   Neuro: Cranial nerves grossly intact  Laboratory: Recent Labs  Lab 02/17/20 0219 02/19/20 0314  02/20/20 0349  WBC 7.6 6.2 7.5  HGB 12.4 12.6 12.1  HCT 37.3 38.5 36.6  PLT 225 239 248   Recent Labs   Lab 02/14/20 0519 02/14/20 0519 02/15/20 0916 02/16/20 0123 02/18/20 0610 02/19/20 0314 02/20/20 0349  NA 136   < > 134*   < > 138 136 137  K 3.8   < > 4.1   < > 4.2 4.4 4.4  CL 105   < > 101   < > 104 106 105  CO2 20*   < > 21*   < > 23 21* 23  BUN 9   < > 8   < > 7 9 12   CREATININE 1.35*   < > 0.91   < > 0.82 0.79 1.06*  CALCIUM 9.3   < > 10.2   < > 9.3 9.3 9.1  PROT 8.4*  --  9.1*  --   --   --  7.4  BILITOT 0.7  --  0.4  --   --   --  0.3  ALKPHOS 95  --  94  --   --   --  64  ALT 24  --  25  --   --   --  18  AST 28  --  27  --   --   --  18  GLUCOSE 89   < > 127*   < > 110* 114* 98   < > = values in this interval not displayed.   Imaging/Diagnostic Tests: No results found.  Lattie Haw, MD 02/20/2020, 5:48 AM PGY-2, [place] Intern pager: [phone], text pages welcome

## 2020-02-20 NOTE — Progress Notes (Signed)
Patient called out to RN that IV infusion of Zithromax 1,200 mg is burning and hurting her arms. She also reported feeling dizzy. IV was checked prior to administration and flushed with no issue or any signs of infiltrations. IV was checked again and still flushed with no issue and no signs of infiltration. Pt reports this is not the first time that she has had this reaction to this infusion. Reports last time she received it that it hurt her arm as well. Pharmacy was made aware of medication reaction. Patient given IV Zofran for her dizziness.

## 2020-02-20 NOTE — Progress Notes (Signed)
[facility] for Infectious Disease  Date of Admission:  02/04/2020     Total days of antibiotics 13         ASSESSMENT:  [name] appears to be tolerating her medications well with no adverse side effects. MRI today showing decreased size of right temporal mass with mild decrease in surrounding vasogenic edema. Discussed importance of taking her medications to help resolve this infection. [facility] to work on putting medications in [place] for ease of administration. Will continue Symtuza and Tivicay for ART therapy supplemented with Bactrim for OI prophylaxis; Keppra for seizures; and Pyrimethamine-leucovorin and clindamycin for toxoplasmosis infection. [facility] for discharge from ID standpoint and will arrange follow up in 1 week with Dr. Baxter Rodriguez.   PLAN:  1. Continue Symtuza and Tivicay for ART therapy and Bactrim for OI prophylaxis. 2. Continue Keppra for seizures 3. Continue Pyrimethamine-leucovorin and clindamycin for toxoplasmosis infection. 4. [facility] for discharge from ID standpoint.  5. [facility] to arrange for medications. 6. Follow up with Dr. Baxter Rodriguez on 02/29/20 @ 245pm  Principal Problem:   CNS toxoplasmosis, suspected  Active Problems:   HIV (human immunodeficiency virus infection) ([place])   Chest pain   AIDS (acquired immune deficiency syndrome) ([place])   Intractable headache   Intracranial mass   . clindamycin  600 mg Oral Q6H  . Darunavir-Cobicisctat-Emtricitabine-Tenofovir Alafenamide  1 tablet Oral Q breakfast  . dolutegravir  50 mg Oral Daily  . enoxaparin (LOVENOX) injection  40 mg Subcutaneous Q24H  . FLUoxetine  20 mg Oral Daily  . levETIRAcetam  500 mg Oral BID  . pantoprazole  40 mg Oral BID AC  . Pyrimethamine-Leucovorin  50 mg Oral Daily   And  . Pyrimethamine-Leucovorin  25 mg Oral Q2000  . sulfamethoxazole-trimethoprim  1 tablet Oral Daily    SUBJECTIVE:  Afebrile overnight with no acute events. Feeling good today.  Tolerating medications with no side effects.   Allergies  Allergen Reactions  . Hydrocodone Itching and Nausea Only    Tolerates Oxycodone  . Tramadol Itching and Nausea Only    Tolerates oxycodone     Review of Systems: Review of Systems  Constitutional: Negative for chills, fever and weight loss.  Respiratory: Negative for cough, shortness of breath and wheezing.   Cardiovascular: Negative for chest pain and leg swelling.  Gastrointestinal: Negative for abdominal pain, constipation, diarrhea, nausea and vomiting.  Skin: Negative for rash.      OBJECTIVE: Vitals:   02/19/20 0801 02/19/20 2100 02/20/20 0300 02/20/20 0903  BP: 118/76 106/72  (!) 125/91  Pulse: 78 84  93  Resp: 20 20  18   Temp: 98.6 F (37 C) 98.4 F (36.9 C)  97.9 F (36.6 C)  TempSrc: Oral Oral  Oral  SpO2: 100% 100%  98%  Weight:   82.9 kg   Height:       Body mass index is 30.41 kg/m.  Physical Exam Constitutional:      General: She is not in acute distress.    Appearance: She is well-developed.  Cardiovascular:     Rate and Rhythm: Normal rate and regular rhythm.     Heart sounds: Normal heart sounds.  Pulmonary:     Effort: Pulmonary effort is normal.     Breath sounds: Normal breath sounds.  Skin:    General: Skin is warm and dry.  Neurological:     Mental Status: She is alert and oriented to person, place, and time.  Psychiatric:        Behavior: Behavior normal.        Thought Content: Thought content normal.        Judgment: Judgment normal.    Lab Results Lab Results  Component Value Date   WBC 7.5 02/20/2020   HGB 12.1 02/20/2020   HCT 36.6 02/20/2020   MCV 92.9 02/20/2020   PLT 248 02/20/2020    Lab Results  Component Value Date   CREATININE 1.06 (H) 02/20/2020   BUN 12 02/20/2020   NA 137 02/20/2020   K 4.4 02/20/2020   CL 105 02/20/2020   CO2 23 02/20/2020    Lab Results  Component Value Date   ALT 18 02/20/2020   AST 18 02/20/2020   ALKPHOS 64 02/20/2020     BILITOT 0.3 02/20/2020     Microbiology: No results found for this or any previous visit (from the past 240 hour(s)).   Terri Piedra, NP [facility] [phone] Pager  02/20/2020  2:48 PM

## 2020-02-21 DIAGNOSIS — R45851 Suicidal ideations: Secondary | ICD-10-CM

## 2020-02-21 DIAGNOSIS — R569 Unspecified convulsions: Secondary | ICD-10-CM

## 2020-02-21 DIAGNOSIS — F322 Major depressive disorder, single episode, severe without psychotic features: Secondary | ICD-10-CM

## 2020-02-21 DIAGNOSIS — R1010 Upper abdominal pain, unspecified: Secondary | ICD-10-CM

## 2020-02-22 ENCOUNTER — Telehealth: Payer: Self-pay

## 2020-02-22 NOTE — Telephone Encounter (Signed)
Patient called office stating that she still does not feel well after being discharged from office. Spoke with Marya Amsler, [place] for recommendations. Patient reports taking her medications as ordered without missed doses. Educated patient that recovery will take time and that medication compliance is a crucial part to feeling better. Also told patient that if she begins to feel worse and cannot manage to go to an urgent care for evaluation. Patient verbalized understanding. Patient has follow up with Dr. Baxter Flattery on 3/17.   Quaneshia Wareing Lorita Officer, RN

## 2020-02-23 ENCOUNTER — Other Ambulatory Visit: Payer: Self-pay

## 2020-02-23 ENCOUNTER — Ambulatory Visit (INDEPENDENT_AMBULATORY_CARE_PROVIDER_SITE_OTHER): Payer: Medicaid Other | Admitting: Family Medicine

## 2020-02-23 ENCOUNTER — Encounter: Payer: Self-pay | Admitting: Family Medicine

## 2020-02-23 ENCOUNTER — Emergency Department (HOSPITAL_BASED_OUTPATIENT_CLINIC_OR_DEPARTMENT_OTHER): Payer: Medicaid Other

## 2020-02-23 ENCOUNTER — Emergency Department (HOSPITAL_COMMUNITY): Payer: Medicaid Other

## 2020-02-23 ENCOUNTER — Emergency Department (HOSPITAL_COMMUNITY)
Admission: EM | Admit: 2020-02-23 | Discharge: 2020-02-23 | Disposition: A | Discharge status: Home / Self Care | Payer: Medicaid Other | Attending: Emergency Medicine | Admitting: Emergency Medicine

## 2020-02-23 VITALS — BP 138/84 | HR 100 | Wt 184.0 lb

## 2020-02-23 DIAGNOSIS — Z79899 Other long term (current) drug therapy: Secondary | ICD-10-CM | POA: Insufficient documentation

## 2020-02-23 DIAGNOSIS — G4489 Other headache syndrome: Secondary | ICD-10-CM | POA: Diagnosis not present

## 2020-02-23 DIAGNOSIS — Z96641 Presence of right artificial hip joint: Secondary | ICD-10-CM | POA: Diagnosis not present

## 2020-02-23 DIAGNOSIS — R079 Chest pain, unspecified: Secondary | ICD-10-CM

## 2020-02-23 DIAGNOSIS — R0602 Shortness of breath: Secondary | ICD-10-CM | POA: Diagnosis not present

## 2020-02-23 DIAGNOSIS — Z96642 Presence of left artificial hip joint: Secondary | ICD-10-CM | POA: Insufficient documentation

## 2020-02-23 DIAGNOSIS — I313 Pericardial effusion (noninflammatory): Secondary | ICD-10-CM

## 2020-02-23 DIAGNOSIS — B20 Human immunodeficiency virus [HIV] disease: Secondary | ICD-10-CM | POA: Diagnosis not present

## 2020-02-23 DIAGNOSIS — R42 Dizziness and giddiness: Secondary | ICD-10-CM | POA: Diagnosis not present

## 2020-02-23 DIAGNOSIS — R0789 Other chest pain: Secondary | ICD-10-CM | POA: Insufficient documentation

## 2020-02-23 LAB — COMPREHENSIVE METABOLIC PANEL
ALT: 28 U/L (ref 0–44)
AST: 23 U/L (ref 15–41)
Albumin: 4.2 g/dL (ref 3.5–5.0)
Alkaline Phosphatase: 89 U/L (ref 38–126)
Anion gap: 13 (ref 5–15)
BUN: 10 mg/dL (ref 6–20)
CO2: 22 mmol/L (ref 22–32)
Calcium: 10.1 mg/dL (ref 8.9–10.3)
Chloride: 99 mmol/L (ref 98–111)
Creatinine, Ser: 1.11 mg/dL — ABNORMAL HIGH (ref 0.44–1.00)
GFR calc Af Amer: >60 mL/min (ref >60)
GFR calc non Af Amer: >60 mL/min (ref >60)
Glucose, Bld: 92 mg/dL (ref 70–99)
Potassium: 4 mmol/L (ref 3.5–5.1)
Sodium: 134 mmol/L — ABNORMAL LOW (ref 135–145)
Total Bilirubin: 0.4 mg/dL (ref 0.3–1.2)
Total Protein: 8.5 g/dL — ABNORMAL HIGH (ref 6.5–8.1)

## 2020-02-23 LAB — ECHOCARDIOGRAM COMPLETE
Height: 65 in
Weight: 2944 oz

## 2020-02-23 LAB — CBC WITH DIFFERENTIAL/PLATELET
Abs Immature Granulocytes: 0.11 10*3/uL — ABNORMAL HIGH (ref 0.00–0.07)
Basophils Absolute: 0 10*3/uL (ref 0.0–0.1)
Basophils Relative: 0 %
Eosinophils Absolute: 0.3 10*3/uL (ref 0.0–0.5)
Eosinophils Relative: 4 %
HCT: 37.9 % (ref 36.0–46.0)
Hemoglobin: 12.6 g/dL (ref 12.0–15.0)
Immature Granulocytes: 2 %
Lymphocytes Relative: 25 %
Lymphs Abs: 1.7 10*3/uL (ref 0.7–4.0)
MCH: 31 pg (ref 26.0–34.0)
MCHC: 33.2 g/dL (ref 30.0–36.0)
MCV: 93.1 fL (ref 80.0–100.0)
Monocytes Absolute: 0.8 10*3/uL (ref 0.1–1.0)
Monocytes Relative: 12 %
Neutro Abs: 3.9 10*3/uL (ref 1.7–7.7)
Neutrophils Relative %: 57 %
Platelets: 296 10*3/uL (ref 150–400)
RBC: 4.07 MIL/uL (ref 3.87–5.11)
RDW: 13.6 % (ref 11.5–15.5)
WBC: 6.8 10*3/uL (ref 4.0–10.5)
nRBC: 0 % (ref 0.0–0.2)

## 2020-02-23 LAB — TROPONIN I (HIGH SENSITIVITY)
Troponin I (High Sensitivity): 4 ng/L (ref <18)
Troponin I (High Sensitivity): 3 ng/L (ref <18)

## 2020-02-23 MED ORDER — IOHEXOL 350 MG/ML SOLN
80.0000 mL | Freq: Once | INTRAVENOUS | Status: AC | PRN
  Administered 2020-02-23: 13:00:00 80 mL via INTRAVENOUS

## 2020-02-23 MED ORDER — NAPROXEN 500 MG PO TABS: 500.0000 mg | ORAL_TABLET | Freq: Two times a day (BID) | ORAL | 0 refills | Status: DC

## 2020-02-23 MED ORDER — ACETAMINOPHEN 500 MG PO TABS
1000.0000 mg | ORAL_TABLET | Freq: Once | ORAL | Status: AC
  Administered 2020-02-23: 15:00:00 1000 mg via ORAL
  Filled 2020-02-23: qty 2

## 2020-02-23 NOTE — ED Notes (Signed)
Echo at bedside

## 2020-02-23 NOTE — ED Notes (Signed)
Pt d/c home per MD order. Discharge summary reviewed with pt, pt verbalizes understanding. Reports discharge ride home , off unit via WC. No s/s of acute distress noted at discharge,

## 2020-02-23 NOTE — Progress Notes (Signed)
  Echocardiogram 2D Echocardiogram has been performed.  Hodges Treiber G Caryl Fate 02/23/2020, 3:05 PM

## 2020-02-23 NOTE — ED Triage Notes (Signed)
Pt to ED via EMS from [facility] doctors office. Pt had an apt for follow up from recent d/c from hospital Monday. Sent here concern for PE. Symptoms started two days after being discharged from hospital. Pt symptoms include, chest pain , SHOB with exertion, ringing in ears, dizziness.  #20 lac- no medications given by EMS.  Last VS: 140/90, hr 115, 99% room air,

## 2020-02-23 NOTE — Progress Notes (Signed)
    SUBJECTIVE:   CHIEF COMPLAINT / HPI:   Chest pain Mrs. Kelsey Rodriguez is a 29 yo female who presented to clinic today for a hospital follow up. She was discharged 02/20/2020. She is currently experiencing chest pain and has been since yesterday. It was worse yesterday than today but continues worsen with exertion and position changes. The pain shoots to her back and is worst on the left side. She has associated shortness of breath with deep breathing and dizziness in which the room is spinning. Her other complaints today are double vision, dry mouth, and tinnitus (described as echoing and whistling sounding). She contacted her infectious disease office yesterday and they felt as if the dizziness was due to medication changes. She endorses compliance with all medication since being discharged. Denies nausea, vomiting, diarrhea.  PERTINENT  PMH / PSH: HIV/AIDS, Toxoplasmosis CNS lesion, Seizures, intractable headaches.  OBJECTIVE:   BP 138/84   Pulse (!) 113   Wt 184 lb (83.5 kg)   LMP 02/01/2020 (Exact Date) Comment: shielded  SpO2 100%   BMI 30.62 kg/m    General: Appears well, no acute distress. Age appropriate. HEENT: PERRL. Tympanic membrane visualized bilaterally with yellow discoloration. Nasopharynx unremarkable. Supple neck no thyromegaly.  Cardiac: tachycardia w/ regular rhythm, normal heart sounds, no murmurs Respiratory: CTAB, normal effort Neuro: CNII-XII grossly intact, no focal deficits Psych: normal affect  ASSESSMENT/PLAN:   Chest pain Stable. EKG NSR. 2 days of chest pain with exertion and positional changes. Recent hospitalization. Associated tacycardia, dyspnea on exertion, vertigo, tinnitis. Etiology unknown at this time but with recent hospitalization I have a high suspicion for PE. Less likely due to MI, pericarditis, aortic dissection, tension pneumothorax, or esophageal rupture.  -ED via EMS for PE rule out; consider exhausting differential -ASA not given to recent  inflammation of toxo brain lesion -Patient aware and voiced understanding to this plan   Gerlene Fee, [place]

## 2020-02-23 NOTE — Discharge Instructions (Signed)
Continue taking home medications as prescribed. Take naproxen twice a day with meals for the next week. Follow-up with your primary care doctor next week for recheck of your symptoms. Return to the emergency room if you develop fevers, severe worsening pain, increased difficulty breathing, or any new, worsening, or concerning symptoms.

## 2020-02-23 NOTE — Assessment & Plan Note (Addendum)
Stable. EKG NSR. 2 days of chest pain with exertion and positional changes. Recent hospitalization. Associated tacycardia, dyspnea on exertion, vertigo, tinnitis. Etiology unknown at this time but with recent hospitalization I have a high suspicion for PE. Less likely due to MI, pericarditis, aortic dissection, tension pneumothorax, or esophageal rupture.  -ED via EMS for PE rule out; consider exhausting differential -ASA not given to recent inflammation of toxo brain lesion -Patient aware and voiced understanding to this plan

## 2020-02-23 NOTE — ED Provider Notes (Signed)
[facility] EMERGENCY DEPARTMENT Provider Note   CSN: [id number] Arrival date & time: 02/23/20  1029     History Chief Complaint  Patient presents with  . Shortness of Breath  . Chest Pain    [place] Bih [name] is a 29 y.o. female presenting for evaluation of chest pain and shortness of breath.  Patient states that the past 3 days she has had persistent substernal chest pain.  Occasionally it radiates towards the left side.  Pain is constant, but wax and wanes in severity.  Is worse when she lays flat and with certain movements.  No increase with deep inspiration.  She reports associated shortness of breath, mostly with exertion.  She denies fall, trauma, injury.  She denies change in activity.  She denies recent travel, surgeries, mobilization, history of cancer, history reveals DVT/PE, hormone use.  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  Patient denies recent fevers, chills, cough, nausea, vomiting, abdominal pain, urinary symptoms, abnormal bowel movements.  Patient was seen by her PCP earlier today, recommended she come to the ER for PE rule out.  Additional history obtained from chart review, reviewed PCP notes and EKG.  Reviewed recent hospitalization.  Patient with a history of adrenal insufficiency, anemia, HIV, previous TB, brain lesion consistent with toxoplasmosis.  HPI     Past Medical History:  Diagnosis Date  . 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 Adrenal insufficiency ([place])   . Anemia of chronic disease 03/11/2012  . Back pain of lumbar region with sciatica 02/12/2015  . Bell's palsy 08/26/2013  . 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 Chronic back pain   . Chronic leg pain    bilateral knees, ankles  . Fatigue   . 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 HIV (human immunodeficiency virus infection) ([place]) 02/2012  . Laceration of ankle, right 11/18/2012  . Lumbar radiculopathy   . Meningitis  02/18/2018  . Pelvic pain   . PID (acute pelvic inflammatory disease) 02/26/2018  . Reflux esophagitis 03/11/2012  . Tuberculosis   . 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 Vertigo     Patient Active Problem List   Diagnosis Date Noted  . Pain of upper abdomen   . Suicide ideation   . 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 Seizure ([place])   . Intracranial mass   . Intractable headache 02/04/2020  . Encephalitis, myelitis, 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[place]) 01/31/2020  . Rotator cuff strain 01/26/2020  . 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 AIDS (acquired immune deficiency syndrome) ([place]) 11/07/2019  . Cerebral edema ([place]) 10/28/2019  . Midline shift of brain   . Pruritus 08/29/2019  . Tendinopathy of left shoulder 01/18/2019  . Chronic pelvic pain in female 01/04/2019  . Lower abdominal pain 06/21/2018  . Syphilis 02/26/2018  . Tuberculosis   . Infertility, female 02/12/2018  . 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 Acute right-sided low back pain with right-sided sciatica 08/24/2017  . Complex regional pain syndrome 02/03/2017  . Headache 10/28/2016  . Avascular necrosis of bone of right hip ([place]) 04/04/2016  . 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 Avascular necrosis of bone of left hip ([place]) 12/14/2015  . 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 Nausea 05/18/2015  . Vertigo 01/23/2015  . Primary adrenal insufficiency ([place]) 01/03/2015  . Chest pain 07/07/2013  . HIV (human  immunodeficiency virus infection) ([place]) 03/16/2012  . 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 APPENDECTOMY  ~ 2000  . DILATION AND CURETTAGE OF UTERUS  2008  . ESOPHAGOGASTRODUODENOSCOPY  03/11/2012   Procedure: ESOPHAGOGASTRODUODENOSCOPY (EGD);  Surgeon: Lafayette Dragon, MD;  Location: [facility] ENDOSCOPY;  Service: Endoscopy;   Laterality: N/A;  . ESOPHAGOGASTRODUODENOSCOPY N/A 03/07/2014   Procedure: ESOPHAGOGASTRODUODENOSCOPY (EGD);  Surgeon: Gatha Mayer, MD;  Location: [facility] ENDOSCOPY;  Service: Endoscopy;  Laterality: N/A;  . LUNG BIOPSY  02/2012  . TOTAL HIP ARTHROPLASTY Left 12/14/2015   Procedure: LEFT TOTAL HIP ARTHROPLASTY ANTERIOR APPROACH;  Surgeon: Mcarthur Rossetti, MD;  Location: [facility] ORS;  Service: Orthopedics;  Laterality: Left;  . TOTAL HIP ARTHROPLASTY Right 04/04/2016   Procedure: RIGHT TOTAL HIP ARTHROPLASTY ANTERIOR APPROACH;  Surgeon: Mcarthur Rossetti, MD;  Location: [facility] ORS;  Service: Orthopedics;  Laterality: Right;     OB History    Gravida  1   Para  0   Term  0   Preterm  0   AB  1   Living  0     SAB  1   TAB  0   Ectopic  0   Multiple  0   Live Births              Family History  Problem Relation Age of Onset  . Heart disease Father        Vague not clearly cardiac    Social History   Tobacco Use  . Smoking status: Never Smoker  . Smokeless tobacco: Never Used  Substance Use Topics  . Alcohol use: No    Alcohol/week: 0.0 standard drinks    Comment: socially  . Drug use: No    Home Medications Prior to Admission medications   Medication Sig Start Date End Date Taking? Authorizing Provider  clindamycin (CLEOCIN) 300 MG capsule Take 2 capsules (600 mg total) by mouth 4 (four) times daily for 28 days. 02/20/20 03/19/20 Yes Dickie La, MD  Darunavir-Cobicisctat-Emtricitabine-Tenofovir Alafenamide [facility]) 800-150-200-10 MG TABS Take 1 tablet by mouth daily with breakfast. 02/20/20  Yes Golden Circle, FNP  dolutegravir (TIVICAY) 50 MG tablet Take 1 tablet (50 mg total) by mouth daily for 28 days. 02/21/20 03/20/20 Yes Dickie La, MD  FLUoxetine (PROZAC) 20 MG capsule Take 1 capsule (20 mg total) by mouth daily. 02/21/20 03/22/20 Yes Lattie Haw, MD  leucovorin (WELLCOVORIN) 25 MG tablet Take 1 tablet (25 mg total) by mouth daily with supper for 28  days. 02/20/20 03/19/20 Yes Dickie La, MD  levETIRAcetam (KEPPRA) 500 MG tablet Take 1 tablet (500 mg total) by mouth 2 (two) times daily for 28 days. 02/20/20 03/19/20 Yes Dickie La, MD  pantoprazole (PROTONIX) 40 MG tablet Take 1 tablet (40 mg total) by mouth daily. 02/20/20 03/21/20 Yes Lattie Haw, MD  pyrimethamine (DARAPRIM) 25 MG tablet Take 3 tablets (75 mg total) by mouth daily with supper for 28 days. 02/20/20 03/19/20 Yes Dickie La, MD  sulfamethoxazole-trimethoprim (BACTRIM) 400-80 MG tablet Take 1 tablet by mouth daily for 28 days. 02/21/20 03/20/20 Yes Dickie La, MD  acetaminophen (TYLENOL) 500 MG tablet Take 2 tablets (1,000 mg total) by mouth every 6 (six) hours as needed for moderate pain. Patient not taking: Reported on 02/23/2020 02/20/20   Lattie Haw, MD  Darunavir-Cobicisctat-Emtricitabine-Tenofovir Alafenamide (SYMTUZA) 800-150-200-10 MG TABS Take 1 tablet by mouth daily with breakfast.  Patient not taking: Reported on 02/23/2020 11/18/19   Golden Circle, FNP  hydrocortisone (ANUSOL-HC) 25 MG suppository Place 1 suppository (25 mg total) rectally 2 (two) times daily. Patient not taking: Reported on 02/23/2020 02/20/20   Lattie Haw, MD  hydrocortisone 1 % ointment Apply 1 application topically 2 (two) times daily. Patient not taking: Reported on 02/23/2020 02/20/20   Lattie Haw, MD  naproxen (NAPROSYN) 500 MG tablet Take 1 tablet (500 mg total) by mouth 2 (two) times daily with a meal. 02/23/20   Krystle Polcyn, PA-C  SUMAtriptan (IMITREX) 50 MG tablet Take 1 tablet (50 mg total) by mouth every 2 (two) hours as needed for migraine (Maximum dose: 100 mg per dose; 200 mg per 24 hours). May repeat in 2 hours if headache persists or recurs. 10/26/19 01/31/20  Caroline More, DO    Allergies    Hydrocodone and Tramadol  Review of Systems   Review of Systems  Respiratory: Positive for shortness of breath.   Cardiovascular: Positive for chest pain.  All other systems reviewed and  are negative.   Physical Exam Updated Vital Signs BP 136/87   Pulse (!) 110   Temp 98.7 F (37.1 C) (Oral)   Resp 18   Ht 5\' 5"  (1.651 m)   Wt 83.5 kg   LMP 02/01/2020 (Exact Date) Comment: shielded  SpO2 100%   BMI 30.62 kg/m   Physical Exam Vitals and nursing note reviewed.  Constitutional:      General: She is not in acute distress.    Appearance: She is well-developed.     Comments: Resting comfortably in the bed in no acute distress  HENT:     Head: Normocephalic and atraumatic.  Eyes:     Extraocular Movements: Extraocular movements intact.     Conjunctiva/sclera: Conjunctivae normal.     Pupils: Pupils are equal, round, and reactive to light.  Cardiovascular:     Rate and Rhythm: Regular rhythm. Tachycardia present.     Pulses: Normal pulses.     Comments: Mildly tachycardic around 105 Pulmonary:     Effort: Pulmonary effort is normal. No respiratory distress.     Breath sounds: Normal breath sounds. No wheezing.     Comments: Speaking full sentences.  Clear lung sounds in all fields.  No signs of respiratory distress or accessory muscle use.  No tenderness palpation of the chest wall. Chest:     Chest wall: No tenderness.  Abdominal:     General: There is no distension.     Palpations: Abdomen is soft. There is no mass.     Tenderness: There is no abdominal tenderness. There is no guarding or rebound.  Musculoskeletal:        General: Normal range of motion.     Cervical back: Normal range of motion and neck supple.     Right lower leg: No edema.     Left lower leg: No edema.  Skin:    General: Skin is warm and dry.     Capillary Refill: Capillary refill takes less than 2 seconds.  Neurological:     Mental Status: She is alert and oriented to person, place, and time.     ED Results / Procedures / Treatments   Labs (all labs ordered are listed, but only abnormal results are displayed) Labs Reviewed  CBC WITH DIFFERENTIAL/PLATELET - Abnormal; Notable  for the following components:      Result Value   Abs Immature Granulocytes 0.11 (*)  All other components within normal limits  COMPREHENSIVE METABOLIC PANEL - Abnormal; Notable for the following components:   Sodium 134 (*)    Creatinine, Ser 1.11 (*)    Total Protein 8.5 (*)    All other components within normal limits  TROPONIN I (HIGH SENSITIVITY)  TROPONIN I (HIGH SENSITIVITY)    EKG EKG Interpretation  Date/Time:  Thursday February 23 2020 12:10:49 EST Ventricular Rate:  95 PR Interval:    QRS Duration: 77 QT Interval:  341 QTC Calculation: 429 R Axis:   36 Text Interpretation: Sinus rhythm Probable left atrial enlargement RSR' in V1 or V2, probably normal variant When comapred to prior, no significant cahnges seen. No STEMI Confirmed by Antony Blackbird [phone]) on 02/23/2020 1:35:12 PM   Radiology CT Angio Chest PE W and/or Wo Contrast  Result Date: 02/23/2020 CLINICAL DATA:  Shortness of breath EXAM: CT ANGIOGRAPHY CHEST WITH CONTRAST TECHNIQUE: Multidetector CT imaging of the chest was performed using the standard protocol during bolus administration of intravenous contrast. Multiplanar CT image reconstructions and MIPs were obtained to evaluate the vascular anatomy. CONTRAST:  61mL OMNIPAQUE IOHEXOL 350 MG/ML SOLN COMPARISON:  CT angiogram chest September 15, 2014; chest radiograph February 13, 2020 FINDINGS: Cardiovascular: There is no demonstrable pulmonary embolus. There is no thoracic aortic aneurysm or dissection. Visualized great vessels appear normal. There is no appreciable pericardial thickening. There is a small pericardial effusion. There is left ventricular hypertrophy. Mediastinum/Nodes: Visualized thyroid appears normal. There is no appreciable thoracic adenopathy. No esophageal lesions are evident. Lungs/Pleura: There is no edema or airspace opacity. There is a calcified nodular opacity arising from the pleura in the upper hemithorax region on the left, stable. There is a  small calcified nodular opacity abutting the pleura in the left upper lobe without associated pleural thickening, stable. No pleural effusions are evident. Upper Abdomen: Visualized upper abdominal structures appear unremarkable. Musculoskeletal: No blastic or lytic bone lesions. No evident chest wall lesions. Review of the MIP images confirms the above findings. IMPRESSION: 1. No demonstrable pulmonary embolus. No thoracic aortic aneurysm or dissection. 2.  Small pericardial effusion. 3. Stable benign-appearing calcified nodular areas left upper lobe toward the apex. No edema or consolidation. No pleural effusions. 4.  No evident thoracic adenopathy. Electronically Signed   By: Lowella Grip III M.D.   On: 02/23/2020 13:08   ECHOCARDIOGRAM COMPLETE  Result Date: 02/23/2020    ECHOCARDIOGRAM REPORT   Patient Name:   TALANI WILMES [facility] Date of Exam: 02/23/2020 Medical Rec #:  [id number]      Height:       65.0 in Accession #:    [id number]     Weight:       184.0 lb Date of Birth:  November 20, 1991      BSA:          1.909 m Patient Age:    29 years       BP:           122/96 mmHg Patient Gender: F              HR:           98 bpm. Exam Location:  Inpatient Procedure: 2D Echo, Cardiac Doppler and Color Doppler                       STAT ECHO Reported to: Dr. Jenkins Rouge on 02/23/2020 3:03:00 PM. Indications:    I31.3 Pericardial effusion  History:  Patient has prior history of Echocardiogram examinations, most                 recent 10/10/2014. Risk Factors:GERD. Tuberculosis. Bell's                 Palsy. HIV.  Sonographer:    Tiffany Dance Referring Phys: [id number] [place]  1. Left ventricular ejection fraction, by estimation, is 60 to 65%. The left ventricle has normal function. The left ventricle has no regional wall motion abnormalities. There is mild left ventricular hypertrophy. Left ventricular diastolic parameters were normal.  2. Right ventricular systolic function is normal. The right  ventricular size is normal.  3. Left atrial size was mildly dilated.  4. The mitral valve is normal in structure. No evidence of mitral valve regurgitation. No evidence of mitral stenosis.  5. The aortic valve is normal in structure. Aortic valve regurgitation is not visualized. No aortic stenosis is present.  6. The inferior vena cava is normal in size with greater than 50% respiratory variability, suggesting right atrial pressure of 3 mmHg. FINDINGS  Left Ventricle: Left ventricular ejection fraction, by estimation, is 60 to 65%. The left ventricle has normal function. The left ventricle has no regional wall motion abnormalities. The left ventricular internal cavity size was normal in size. There is  mild left ventricular hypertrophy. Left ventricular diastolic parameters were normal. Right Ventricle: The right ventricular size is normal. No increase in right ventricular wall thickness. Right ventricular systolic function is normal. Left Atrium: Left atrial size was mildly dilated. Right Atrium: Right atrial size was normal in size. Pericardium: Trivial pericardial effusion is present. Mitral Valve: The mitral valve is normal in structure. Normal mobility of the mitral valve leaflets. No evidence of mitral valve regurgitation. No evidence of mitral valve stenosis. Tricuspid Valve: The tricuspid valve is normal in structure. Tricuspid valve regurgitation is trivial. No evidence of tricuspid stenosis. Aortic Valve: The aortic valve is normal in structure. Aortic valve regurgitation is not visualized. No aortic stenosis is present. Pulmonic Valve: The pulmonic valve was normal in structure. Pulmonic valve regurgitation is trivial. No evidence of pulmonic stenosis. Aorta: The aortic root is normal in size and structure. Venous: The inferior vena cava is normal in size with greater than 50% respiratory variability, suggesting right atrial pressure of 3 mmHg. IAS/Shunts: No atrial level shunt detected by color flow  Doppler.  LEFT VENTRICLE PLAX 2D LVIDd:         3.60 cm  Diastology LVIDs:         2.20 cm  LV e' lateral:   10.40 cm/s LV PW:         1.10 cm  LV E/e' lateral: 6.6 LV IVS:        1.10 cm  LV e' medial:    8.16 cm/s LVOT diam:     1.90 cm  LV E/e' medial:  8.4 LV SV:         68 LV SV Index:   36 LVOT Area:     2.84 cm  RIGHT VENTRICLE             IVC RV Basal diam:  1.40 cm     IVC diam: 1.30 cm RV S prime:     16.20 cm/s TAPSE (M-mode): 1.8 cm LEFT ATRIUM             Index       RIGHT ATRIUM          Index  LA diam:        3.80 cm 1.99 cm/m  RA Area:     7.23 cm LA Vol (A2C):   40.7 ml 21.32 ml/m RA Volume:   11.00 ml 5.76 ml/m LA Vol (A4C):   29.9 ml 15.66 ml/m LA Biplane Vol: 35.2 ml 18.44 ml/m  AORTIC VALVE LVOT Vmax:   123.00 cm/s LVOT Vmean:  78.200 cm/s LVOT VTI:    0.242 m  AORTA Ao Root diam: 3.10 cm Ao Asc diam:  2.50 cm MITRAL VALVE MV Area (PHT): 3.03 cm    SHUNTS MV Decel Time: 250 msec    Systemic VTI:  0.24 m MV E velocity: 68.40 cm/s  Systemic Diam: 1.90 cm MV A velocity: 88.80 cm/s MV E/A ratio:  0.77 Jenkins Rouge MD Electronically signed by Jenkins Rouge MD Signature Date/Time: 02/23/2020/3:07:30 PM    Final     Procedures Procedures (including critical care time)  Medications Ordered in ED Medications  iohexol (OMNIPAQUE) 350 MG/ML injection 80 mL (80 mLs Intravenous Contrast Given 02/23/20 1258)  acetaminophen (TYLENOL) tablet 1,000 mg (1,000 mg Oral Given 02/23/20 1528)    ED Course  I have reviewed the triage vital signs and the nursing notes.  Pertinent labs & imaging results that were available during my care of the patient were reviewed by me and considered in my medical decision making (see chart for details).    MDM Rules/Calculators/A&P                      Presenting for evaluation of chest pain and dyspnea on exertion.  Physical exam shows patient appears nontoxic.  She is mildly tachycardic, and had a recent hospitalization.  As such, consider PE.  Low suspicion  for ACS, however due to left-sided chest pain will obtain cardiac enzymes and EKG.  Due to her history of HIV, toxoplasmosis, previous TB, will evaluate for PE using CT imaging, so I can also evaluate lungs for infection or abnormal lesions.  Labs interpreted by me, overall reassuring.  Troponin negative.  No leukocytosis.  Electrolytes stable.  EKG shows no change from previous.  CTA pending.  CTA negative for PE.  Does show pericardial effusion.  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  Discussed with Dr. Johnsie Cancel from cardiology who recommends echo for better evaluation of pericardial effusion.  Will review the ED echo.  Echo interpreted by Dr. Johnsie Cancel.  Overall benign, may be a small effusion.  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  Recommends NSAIDs and follow-up with PCP.  Discussed findings and plan with patient, who is agreeable.  At this time, patient appears safe for discharge.  Return precautions given.  Patient states she understands and agrees to plan.  Final Clinical Impression(s) / ED Diagnoses Final diagnoses:  Atypical chest pain  Shortness of breath    Rx / DC Orders ED Discharge Orders         Ordered    naproxen (NAPROSYN) 500 MG tablet  2 times daily with meals     02/23/20 1512           Milta Croson, PA-C 02/23/20 1532    Tegeler, Gwenyth Allegra, MD 02/24/20 [phone]

## 2020-02-23 NOTE — Addendum Note (Signed)
Addended by: Owens Shark, Delle Andrzejewski on: 02/23/2020 07:14 PM   Modules accepted: Level of Service

## 2020-02-29 ENCOUNTER — Inpatient Hospital Stay: Payer: Medicaid Other | Admitting: Internal Medicine

## 2020-02-29 ENCOUNTER — Ambulatory Visit: Payer: Medicaid Other | Admitting: Pharmacist

## 2020-03-07 ENCOUNTER — Ambulatory Visit (INDEPENDENT_AMBULATORY_CARE_PROVIDER_SITE_OTHER): Payer: Medicaid Other | Admitting: Internal Medicine

## 2020-03-07 ENCOUNTER — Encounter: Payer: Self-pay | Admitting: Internal Medicine

## 2020-03-07 ENCOUNTER — Other Ambulatory Visit: Payer: Self-pay

## 2020-03-07 VITALS — BP 134/92 | HR 96 | Temp 98.7°F | Wt 186.0 lb

## 2020-03-07 DIAGNOSIS — R0789 Other chest pain: Secondary | ICD-10-CM

## 2020-03-07 DIAGNOSIS — B582 Toxoplasma meningoencephalitis: Secondary | ICD-10-CM

## 2020-03-07 DIAGNOSIS — B20 Human immunodeficiency virus [HIV] disease: Secondary | ICD-10-CM | POA: Diagnosis not present

## 2020-03-07 NOTE — Progress Notes (Signed)
Patient ID: Kelsey Rodriguez, female   DOB: [birth year], 29 y.o.   MRN: [id number]  HPI Kelsey Rodriguez is a 29yo F with advanced hiv disease, hx mTB, noncompliance and  Admitted recently for CNS toxo - currently on 4 of 6th week of induction for CNS toxo  Dizziness, light headedness she notices Occasionally pleuritic chest pain, went to ED for evaluation, gave nsaids for mild pericarditis  Outpatient Encounter Medications as of 03/07/2020  Medication Sig  . acetaminophen (TYLENOL) 500 MG tablet Take 2 tablets (1,000 mg total) by mouth every 6 (six) hours as needed for moderate pain.  . clindamycin (CLEOCIN) 300 MG capsule Take 2 capsules (600 mg total) by mouth 4 (four) times daily for 28 days.  . Darunavir-Cobicisctat-Emtricitabine-Tenofovir Alafenamide (SYMTUZA) 800-150-200-10 MG TABS Take 1 tablet by mouth daily with breakfast.  . Darunavir-Cobicisctat-Emtricitabine-Tenofovir Alafenamide (SYMTUZA) 800-150-200-10 MG TABS Take 1 tablet by mouth daily with breakfast.  . dolutegravir (TIVICAY) 50 MG tablet Take 1 tablet (50 mg total) by mouth daily for 28 days.  [name] FLUoxetine (PROZAC) 20 MG capsule Take 1 capsule (20 mg total) by mouth daily.  . naproxen (NAPROSYN) 500 MG tablet Take 1 tablet (500 mg total) by mouth 2 (two) times daily with a meal.  . pantoprazole (PROTONIX) 40 MG tablet Take 1 tablet (40 mg total) by mouth daily.  [name] pyrimethamine (DARAPRIM) 25 MG tablet Take 3 tablets (75 mg total) by mouth daily with supper for 28 days.  [name] sulfamethoxazole-trimethoprim (BACTRIM) 400-80 MG tablet Take 1 tablet by mouth daily for 28 days.  . hydrocortisone (ANUSOL-HC) 25 MG suppository Place 1 suppository (25 mg total) rectally 2 (two) times daily. (Patient not taking: Reported on 02/23/2020)  . hydrocortisone 1 % ointment Apply 1 application topically 2 (two) times daily. (Patient not taking: Reported on 02/23/2020)  . leucovorin (WELLCOVORIN) 25 MG tablet Take 1 tablet (25 mg total) by mouth daily  with supper for 28 days. (Patient not taking: Reported on 03/07/2020)  . levETIRAcetam (KEPPRA) 500 MG tablet Take 1 tablet (500 mg total) by mouth 2 (two) times daily for 28 days. (Patient not taking: Reported on 03/07/2020)  . [DISCONTINUED] SUMAtriptan (IMITREX) 50 MG tablet Take 1 tablet (50 mg total) by mouth every 2 (two) hours as needed for migraine (Maximum dose: 100 mg per dose; 200 mg per 24 hours). May repeat in 2 hours if headache persists or recurs.   No facility-administered encounter medications on file as of 03/07/2020.     Patient Active Problem List   Diagnosis Date Noted  . Pain of upper abdomen   . Suicide ideation   . 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Kelsey Rodriguez)   . Seizure (Kelsey Rodriguez)   . Intracranial mass   . Intractable headache 02/04/2020  . Encephalitis, myelitis, 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Kelsey Rodriguez) 01/31/2020  . Rotator cuff strain 01/26/2020  . 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 AIDS (acquired immune deficiency syndrome) (Kelsey Rodriguez) 11/07/2019  . Cerebral edema (Kelsey Rodriguez) 10/28/2019  . Midline shift of brain   . Pruritus 08/29/2019  . Tendinopathy of left shoulder 01/18/2019  . Chronic pelvic pain in female 01/04/2019  . Lower abdominal pain 06/21/2018  . Syphilis 02/26/2018  . Tuberculosis   . Infertility, female 02/12/2018  . 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 Acute right-sided low back pain with right-sided sciatica 08/24/2017  . Complex regional pain syndrome 02/03/2017  . Headache 10/28/2016  . Avascular necrosis of bone of right  hip ([place]) 04/04/2016  . 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 Avascular necrosis of bone of left hip ([place]) 12/14/2015  . 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 Nausea 05/18/2015  . Vertigo 01/23/2015  . Primary adrenal insufficiency (Kelsey Rodriguez) 01/03/2015  . Chest pain 07/07/2013  . HIV (human immunodeficiency virus infection) (Kelsey Rodriguez) 03/16/2012  .  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   Review of Systems 12 point ros is negative except what I mentioned above Physical Exam   BP (!) 134/92   Pulse 96   Temp 98.7 F (37.1 C) (Oral)   Wt 186 lb (84.4 kg)   LMP 03/04/2020 (Exact Date)   BMI 30.95 kg/m   Physical Exam  Constitutional:  oriented to person, place, and time. appears well-developed and well-nourished. No distress.  HENT: [place]/AT, PERRLA, no scleral icterus Mouth/Throat: Oropharynx is clear and moist. No oropharyngeal exudate.  Cardiovascular: Normal rate, regular rhythm and normal heart sounds. Exam reveals no gallop and no friction rub.  No murmur heard.  Pulmonary/Chest: Effort normal and breath sounds normal. No respiratory distress.  has no wheezes.  Neck = supple, no nuchal rigidity Abdominal: Soft. Bowel sounds are normal.  exhibits no distension. There is no tenderness.  Lymphadenopathy: no cervical adenopathy. No axillary adenopathy Neurological: alert and oriented to person, place, and time.  Skin: Skin is warm and dry. No rash noted. No erythema.  Psychiatric: a normal mood and affect.  behavior is normal.   Lab Results  Component Value Date   CD4TCELL 4 (L) 11/18/2019   Lab Results  Component Value Date   CD4TABS 36 (L) 11/18/2019   CD4TABS 65 (L) 10/31/2019   CD4TABS 70 (L) 04/28/2019   Lab Results  Component Value Date   HIV1RNAQUANT 131,000 02/05/2020   Lab Results  Component Value Date   HEPBSAB POS (A) 09/13/2015   Lab Results  Component Value Date   LABRPR Reactive (A) 10/29/2019    CBC Lab Results  Component Value Date   WBC 6.8 02/23/2020   RBC 4.07 02/23/2020   HGB 12.6 02/23/2020   HCT 37.9 02/23/2020   PLT 296 02/23/2020   MCV 93.1 02/23/2020   MCH 31.0 02/23/2020   MCHC 33.2 02/23/2020   RDW 13.6 02/23/2020   LYMPHSABS 1.7 02/23/2020   MONOABS 0.8 02/23/2020   EOSABS 0.3  02/23/2020    BMET Lab Results  Component Value Date   NA 134 (L) 02/23/2020   K 4.0 02/23/2020   CL 99 02/23/2020   CO2 22 02/23/2020   GLUCOSE 92 02/23/2020   BUN 10 02/23/2020   CREATININE 1.11 (H) 02/23/2020   CALCIUM 10.1 02/23/2020   GFRNONAA >60 02/23/2020   GFRAA >60 02/23/2020      Assessment and Plan  hiv disease = will check VL  Cns toxo = has 2 more weeks of induction therapy then we will narrow to maintenance treatment. Will check cbc and bmp  Medicaid access = will give paperwork to help with access   Atypical Chest pain = maybe also related to gerd - will increase to BID

## 2020-03-08 LAB — T-HELPER CELL (CD4) - (RCID CLINIC ONLY)
CD4 % Helper T Cell: 4 % — ABNORMAL LOW (ref 33–65)
CD4 T Cell Abs: 45 /uL — ABNORMAL LOW (ref 400–1790)

## 2020-03-09 LAB — BASIC METABOLIC PANEL
BUN: 13 mg/dL (ref 7–25)
CO2: 22 mmol/L (ref 20–32)
Calcium: 9.8 mg/dL (ref 8.6–10.2)
Chloride: 105 mmol/L (ref 98–110)
Creat: 1.05 mg/dL (ref 0.50–1.10)
Glucose, Bld: 88 mg/dL (ref 65–99)
Potassium: 4 mmol/L (ref 3.5–5.3)
Sodium: 137 mmol/L (ref 135–146)

## 2020-03-09 LAB — HIV-1 RNA QUANT-NO REFLEX-BLD
HIV 1 RNA Quant: 693 copies/mL — ABNORMAL HIGH
HIV-1 RNA Quant, Log: 2.84 Log copies/mL — ABNORMAL HIGH

## 2020-03-09 LAB — CBC WITH DIFFERENTIAL/PLATELET
Absolute Monocytes: 571 cells/uL (ref 200–950)
Basophils Absolute: 10 cells/uL (ref 0–200)
Basophils Relative: 0.2 %
Eosinophils Absolute: 230 cells/uL (ref 15–500)
Eosinophils Relative: 4.8 %
HCT: 32.5 % — ABNORMAL LOW (ref 35.0–45.0)
Hemoglobin: 11 g/dL — ABNORMAL LOW (ref 11.7–15.5)
Lymphs Abs: 1090 cells/uL (ref 850–3900)
MCH: 31.3 pg (ref 27.0–33.0)
MCHC: 33.8 g/dL (ref 32.0–36.0)
MCV: 92.3 fL (ref 80.0–100.0)
MPV: 11.3 fL (ref 7.5–12.5)
Monocytes Relative: 11.9 %
Neutro Abs: 2899 cells/uL (ref 1500–7800)
Neutrophils Relative %: 60.4 %
Platelets: 195 10*3/uL (ref 140–400)
RBC: 3.52 10*6/uL — ABNORMAL LOW (ref 3.80–5.10)
RDW: 13.8 % (ref 11.0–15.0)
Total Lymphocyte: 22.7 %
WBC: 4.8 10*3/uL (ref 3.8–10.8)

## 2020-03-09 LAB — C-REACTIVE PROTEIN: CRP: 9.5 mg/L — ABNORMAL HIGH (ref <8.0)

## 2020-03-09 LAB — SEDIMENTATION RATE: Sed Rate: 60 mm/h — ABNORMAL HIGH (ref 0–20)

## 2020-03-21 ENCOUNTER — Ambulatory Visit: Payer: Medicaid Other | Admitting: Internal Medicine

## 2020-03-22 ENCOUNTER — Ambulatory Visit: Payer: Medicaid Other | Admitting: Internal Medicine

## 2020-03-22 ENCOUNTER — Ambulatory Visit (INDEPENDENT_AMBULATORY_CARE_PROVIDER_SITE_OTHER): Payer: Medicaid Other | Admitting: Internal Medicine

## 2020-03-22 ENCOUNTER — Other Ambulatory Visit: Payer: Self-pay

## 2020-03-22 VITALS — BP 144/90 | HR 106 | Temp 98.4°F | Wt 186.0 lb

## 2020-03-22 DIAGNOSIS — Z9119 Patient's noncompliance with other medical treatment and regimen: Secondary | ICD-10-CM | POA: Diagnosis not present

## 2020-03-22 DIAGNOSIS — B582 Toxoplasma meningoencephalitis: Secondary | ICD-10-CM

## 2020-03-22 DIAGNOSIS — K219 Gastro-esophageal reflux disease without esophagitis: Secondary | ICD-10-CM | POA: Diagnosis not present

## 2020-03-22 DIAGNOSIS — B20 Human immunodeficiency virus [HIV] disease: Secondary | ICD-10-CM

## 2020-03-22 DIAGNOSIS — Z91199 Patient's noncompliance with other medical treatment and regimen due to unspecified reason: Secondary | ICD-10-CM

## 2020-03-22 MED ORDER — SYMTUZA 800-150-200-10 MG PO TABS: 1.0000 | ORAL_TABLET | Freq: Every day | ORAL | 11 refills | Status: DC

## 2020-03-22 MED ORDER — SULFAMETHOXAZOLE-TRIMETHOPRIM 800-160 MG PO TABS: 1.0000 | ORAL_TABLET | Freq: Two times a day (BID) | ORAL | 3 refills | Status: DC

## 2020-03-22 MED ORDER — LEVETIRACETAM 500 MG PO TABS: 500.0000 mg | ORAL_TABLET | Freq: Two times a day (BID) | ORAL | 3 refills | Status: DC

## 2020-03-22 MED ORDER — TIVICAY 50 MG PO TABS: 50.0000 mg | ORAL_TABLET | Freq: Every day | ORAL | 11 refills | Status: DC

## 2020-03-22 NOTE — Progress Notes (Signed)
RFV: follow up for hiv disease and CNS toxo  Patient ID: Kelsey Rodriguez, female   DOB: [birth year], 29 y.o.   MRN: [id number]  HPI Kelsey Rodriguez is a 29yo F with poorly controlled hiv disease, who was recently admitted for CNS toxo c/b seizure and intolerance to treatment management. She is roughly 4 wk into her induction treatment for toxoplasmosis. She continues to take her medications though she reports running out of tivicay.   Still has some epigastric pain at bedtime.  Outpatient Encounter Medications as of 03/22/2020  Medication Sig  . acetaminophen (TYLENOL) 500 MG tablet Take 2 tablets (1,000 mg total) by mouth every 6 (six) hours as needed for moderate pain.  . Darunavir-Cobicisctat-Emtricitabine-Tenofovir Alafenamide (SYMTUZA) 800-150-200-10 MG TABS Take 1 tablet by mouth daily with breakfast.  . Darunavir-Cobicisctat-Emtricitabine-Tenofovir Alafenamide (SYMTUZA) 800-150-200-10 MG TABS Take 1 tablet by mouth daily with breakfast.  . FLUoxetine (PROZAC) 20 MG capsule Take 1 capsule (20 mg total) by mouth daily.  . naproxen (NAPROSYN) 500 MG tablet Take 1 tablet (500 mg total) by mouth 2 (two) times daily with a meal.  . hydrocortisone (ANUSOL-HC) 25 MG suppository Place 1 suppository (25 mg total) rectally 2 (two) times daily. (Patient not taking: Reported on 02/23/2020)  . hydrocortisone 1 % ointment Apply 1 application topically 2 (two) times daily. (Patient not taking: Reported on 02/23/2020)  . levETIRAcetam (KEPPRA) 500 MG tablet Take 1 tablet (500 mg total) by mouth 2 (two) times daily for 28 days. (Patient not taking: Reported on 03/07/2020)  . pantoprazole (PROTONIX) 40 MG tablet Take 1 tablet (40 mg total) by mouth daily.  . [DISCONTINUED] SUMAtriptan (IMITREX) 50 MG tablet Take 1 tablet (50 mg total) by mouth every 2 (two) hours as needed for migraine (Maximum dose: 100 mg per dose; 200 mg per 24 hours). May repeat in 2 hours if headache persists or recurs.   No facility-administered  encounter medications on file as of 03/22/2020.     Patient Active Problem List   Diagnosis Date Noted  . Pain of upper abdomen   . Suicide ideation   . 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[place])   . Seizure ([place])   . Intracranial mass   . Intractable headache 02/04/2020  . Encephalitis, myelitis, 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[place]) 01/31/2020  . Rotator cuff strain 01/26/2020  . 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 AIDS (acquired immune deficiency syndrome) ([place]) 11/07/2019  . Cerebral edema ([place]) 10/28/2019  . Midline shift of brain   . Pruritus 08/29/2019  . Tendinopathy of left shoulder 01/18/2019  . Chronic pelvic pain in female 01/04/2019  . Lower abdominal pain 06/21/2018  . Syphilis 02/26/2018  . Tuberculosis   . Infertility, female 02/12/2018  . 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 Acute right-sided low back pain with right-sided sciatica 08/24/2017  . Complex regional pain syndrome 02/03/2017  . Headache 10/28/2016  . Avascular necrosis of bone of right hip ([place]) 04/04/2016  . 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 Avascular necrosis of bone of left hip ([place]) 12/14/2015  . 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 Nausea 05/18/2015  . Vertigo 01/23/2015  . Primary adrenal insufficiency ([place]) 01/03/2015  . Chest pain 07/07/2013  . HIV (human immunodeficiency virus infection) ([place]) 03/16/2012  . 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   Review of Systems  Review of Systems  Constitutional: Negative for fever, chills, diaphoresis, activity change, appetite change, fatigue and unexpected weight change.  HENT: Negative for congestion, sore throat, rhinorrhea, sneezing, trouble swallowing and sinus pressure.  Eyes: Negative for photophobia and visual disturbance.  Respiratory: Negative for  cough, chest tightness, shortness of breath, wheezing and stridor.  Cardiovascular: Negative for chest pain, palpitations and leg swelling.  Gastrointestinal:+heartburn. Negative for nausea, vomiting, abdominal pain, diarrhea, constipation, blood in stool, abdominal distention and anal bleeding.  Genitourinary: Negative for dysuria, hematuria, flank pain and difficulty urinating.  Musculoskeletal: Negative for myalgias, back pain, joint swelling, arthralgias and gait problem.  Skin: Negative for color change, pallor, rash and wound.  Neurological: Negative for dizziness, tremors, weakness and light-headedness.  Hematological: Negative for adenopathy. Does not bruise/bleed easily.  Psychiatric/Behavioral: Negative for behavioral problems, confusion, sleep disturbance, dysphoric mood, decreased concentration and agitation.    Physical Exam   BP (!) 144/90   Pulse (!) 106   Temp 98.4 F (36.9 C) (Oral)   Wt 186 lb (84.4 kg)   LMP 03/04/2020 (Exact Date)   BMI 30.95 kg/m   Physical Exam  Constitutional:  oriented to person, place, and time. appears well-developed and well-nourished. No distress.  HENT: [place]/AT, PERRLA, no scleral icterus Mouth/Throat: Oropharynx is clear and moist. No oropharyngeal exudate.  Cardiovascular: Normal rate, regular rhythm and normal heart sounds. Exam reveals no gallop and no friction rub.  No murmur heard.  Pulmonary/Chest: Effort normal and breath sounds normal. No respiratory distress.  has no wheezes.  Neck = supple, no nuchal rigidity Abdominal: Soft. Bowel sounds are normal.  exhibits no distension. There is no tenderness.  Lymphadenopathy: no cervical adenopathy. No axillary adenopathy Neurological: alert and oriented to person, place, and time.  Skin: Skin is warm and dry. No rash noted. No erythema.  Psychiatric: a normal mood and affect.  behavior is normal.   Lab Results  Component Value Date   CD4TCELL 4 (L) 03/07/2020   Lab Results    Component Value Date   CD4TABS 45 (L) 03/07/2020   CD4TABS 36 (L) 11/18/2019   CD4TABS 65 (L) 10/31/2019   Lab Results  Component Value Date   HIV1RNAQUANT 693 (H) 03/07/2020   Lab Results  Component Value Date   HEPBSAB POS (A) 09/13/2015   Lab Results  Component Value Date   LABRPR Reactive (A) 10/29/2019    CBC Lab Results  Component Value Date   WBC 4.8 03/07/2020   RBC 3.52 (L) 03/07/2020   HGB 11.0 (L) 03/07/2020   HCT 32.5 (L) 03/07/2020   PLT 195 03/07/2020   MCV 92.3 03/07/2020   MCH 31.3 03/07/2020   MCHC 33.8 03/07/2020   RDW 13.8 03/07/2020   LYMPHSABS 1,090 03/07/2020   MONOABS 0.8 02/23/2020   EOSABS 230 03/07/2020    BMET Lab Results  Component Value Date   NA 137 03/07/2020   K 4.0 03/07/2020   CL 105 03/07/2020   CO2 22 03/07/2020   GLUCOSE 88 03/07/2020   BUN 13 03/07/2020   CREATININE 1.05 03/07/2020   CALCIUM 9.8 03/07/2020   GFRNONAA >60 02/23/2020   GFRAA >60 02/23/2020      Assessment and Plan HIV disease =  Ran out tivicay 3 days ago. - refill tivicay/symtuza  CNS toxo = she finishes clindamycin in 3 days. Then will plan to switch bactrim DS 1 tab twice day  Seizure = refill keppra 500mg  twice a day   See back in 4 wk, plan  to check VL to see that she is undetectable

## 2020-03-26 ENCOUNTER — Ambulatory Visit: Payer: Medicaid Other | Admitting: Orthopaedic Surgery

## 2020-04-09 ENCOUNTER — Ambulatory Visit: Payer: Medicaid Other | Admitting: Internal Medicine

## 2020-04-18 ENCOUNTER — Encounter: Payer: Self-pay | Admitting: Family Medicine

## 2020-05-07 ENCOUNTER — Encounter: Payer: Self-pay | Admitting: Adult Health

## 2020-05-07 ENCOUNTER — Ambulatory Visit: Payer: Medicaid Other | Admitting: Adult Health

## 2020-05-07 ENCOUNTER — Telehealth: Payer: Self-pay | Admitting: Adult Health

## 2020-05-07 ENCOUNTER — Other Ambulatory Visit: Payer: Self-pay

## 2020-05-07 VITALS — BP 125/84 | HR 93 | Ht 65.0 in | Wt 187.0 lb

## 2020-05-07 DIAGNOSIS — R519 Headache, unspecified: Secondary | ICD-10-CM

## 2020-05-07 DIAGNOSIS — H9312 Tinnitus, left ear: Secondary | ICD-10-CM | POA: Diagnosis not present

## 2020-05-07 DIAGNOSIS — B20 Human immunodeficiency virus [HIV] disease: Secondary | ICD-10-CM | POA: Diagnosis not present

## 2020-05-07 DIAGNOSIS — Z8619 Personal history of other infectious and parasitic diseases: Secondary | ICD-10-CM

## 2020-05-07 MED ORDER — AMITRIPTYLINE HCL 10 MG PO TABS: ORAL_TABLET | ORAL | 3 refills | Status: DC

## 2020-05-07 MED ORDER — KETOROLAC TROMETHAMINE 60 MG/2ML IM SOLN
60.0000 mg | Freq: Once | INTRAMUSCULAR | Status: AC
  Administered 2020-05-07: 30 mg via INTRAMUSCULAR

## 2020-05-07 MED ORDER — KETOROLAC TROMETHAMINE 30 MG/ML IJ SOLN: 30.0000 mg | Freq: Once | INTRAMUSCULAR | Status: DC

## 2020-05-07 NOTE — Telephone Encounter (Signed)
Medicaid order sent to GI. They will obtain the auth and reach out to the patient to schedule.  

## 2020-05-07 NOTE — Progress Notes (Addendum)
PATIENT: Kelsey Rodriguez DOB: 1991/08/22  REASON FOR VISIT: follow up HISTORY FROM: patient  HISTORY OF PRESENT ILLNESS: Today 05/07/20:  [name] is a 29 year old female with a history of headaches, HIV and CNS toxoplasmosis.  She returns today for follow-up.  She states that she has had headaches off and on since her last visit.  She is most concerned about a headache that started 4 days ago.  She states that it is primarily on the right side.  Denies any new numbness or tingling.  No changes with her vision.  She reports photophobia.  Reports a new symptom of tinnitus in the left ear.  Describes it as an engine roaring with variation of intensity.  Reports that on occasion it is so loud that she cannot hear people when they are talking to her.  She does have a upcoming appointment with her infectious disease physician.  She returns today for an evaluation.  HISTORY 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  The patient is unaccompanied today.  I last saw her on 03/24/2017 for recurrent headaches at which time she reported doing well on amitriptyline.  She did not return for follow-up after that.    Today,01/31/2020:  She reportsdoing better with the HAs, has tried Imitrex which is marginally helpful.  She no longer has amitriptyline.  She feels better since her hospitalization.  She had a brain MRI with and without contrast on 10/28/2019 and I reviewed the results: IMPRESSION: 1. 2.4 x 1.9 x 2.4 cm cortically based rim enhancing lesion involving the right temporal lobe with associated scattered leptomeningeal and dural enhancement within  the adjacent right frontotemporal region. Associated vasogenic edema with up to 4 mm of right-to-left midline shift. Given the history of HIV, primary differential consideration consists of intracranial infection with meningitis and localized cerebritis, including opportunistic infections such as TB or toxoplasmosis. No frank restricted diffusion to suggest intracerebral abscess. Correlation with CSF fluid analysis recommended. Sequelae of IRIS could also be considered in the correct clinical setting. High-grade glioma could also have this appearance, although is felt to be less likely and should only be considered if alternate etiologies are excluded. 2. Underlying mildly advanced generalized cerebral volume loss for age, which could reflect the sequelae of HIV encephalitis.  She was treated with antibiotics for toxoplasmosis concern, she did not have a lumbar puncture, neurology and neurosurgery were consulted during the hospitalization.  She had a recent follow-up with her ID specialist since her hospitalization.  She had a recent eye examination last week.  She denies any one-sided weakness or numbness or tingling, nuchal stiffness, or severe headaches, she has milder headaches that are nearly daily.  She recalls doing well on amitriptyline and would be willing to try it again.   She has had intermittent dizzy spells, she feels that her moving or objects moving near her makes it worse.  Sometimes bending down and standing up quickly makes it worse, no actual nausea or real spinning sensation.  She has not fallen.  She does not feel lightheaded when suddenly standing up.  She tries to hydrate well.  She does not drink caffeine daily.  She has had midline low back pain without radiation.  This has been going on for several months, she has seen Dr. Ninfa Linden.  She had a lumbar spine MRI without contrast on 10/21/2019 and I reviewed the results: IMPRESSION: 1. Unchanged minimal degenerative disc  disease at L5-S1. No stenosis or impingement.  The patient's allergies, current medications, family history, past medical history, past social history, past surgical history and problem list were reviewed and updated as appropriate.  REVIEW OF SYSTEMS: Out of a complete 14 system review of symptoms, the patient complains only of the following symptoms, and all other reviewed systems are negative.  ALLERGIES: Allergies  Allergen Reactions  . Hydrocodone Itching and Nausea Only    Tolerates Oxycodone  . Tramadol Itching and Nausea Only    Tolerates oxycodone    HOME MEDICATIONS: Outpatient Medications Prior to Visit  Medication Sig Dispense Refill  . Darunavir-Cobicisctat-Emtricitabine-Tenofovir Alafenamide (SYMTUZA) 800-150-200-10 MG TABS Take 1 tablet by mouth daily with breakfast. 30 tablet 11  . dolutegravir (TIVICAY) 50 MG tablet Take 1 tablet (50 mg total) by mouth daily. 30 tablet 11  . levETIRAcetam (KEPPRA) 500 MG tablet Take 1 tablet (500 mg total) by mouth 2 (two) times daily. 60 tablet 3  . naproxen (NAPROSYN) 500 MG tablet Take 1 tablet (500 mg total) by mouth 2 (two) times daily with a meal. 14 tablet 0  . sulfamethoxazole-trimethoprim (BACTRIM DS) 800-160 MG tablet Take 1 tablet by mouth 2 (two) times daily. 60 tablet 3  . FLUoxetine (PROZAC) 20 MG capsule Take 1 capsule (20 mg total) by mouth daily. 30 capsule 0  . pantoprazole (PROTONIX) 40 MG tablet Take 1 tablet (40 mg total) by mouth daily. 30 tablet 0  . hydrocortisone (ANUSOL-HC) 25 MG suppository Place 1 suppository (25 mg total) rectally 2 (two) times daily. (Patient not taking: Reported on 02/23/2020) 12 suppository 0  . hydrocortisone 1 % ointment Apply 1 application topically 2 (two) times daily. (Patient not taking: Reported on 02/23/2020) 30 g 0  . levETIRAcetam (KEPPRA) 500 MG tablet Take 1 tablet (500 mg total) by mouth 2 (two) times daily for 28 days. (Patient not taking: Reported on 03/07/2020) 56 tablet 0    No facility-administered medications prior to visit.    PAST MEDICAL HISTORY: Past Medical History:  Diagnosis Date  . 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 Adrenal insufficiency ([place])   . Anemia of chronic disease 03/11/2012  . Back pain of lumbar region with sciatica 02/12/2015  . Bell's palsy 08/26/2013  . 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 Chronic back pain   . Chronic leg pain    bilateral knees, ankles  . Fatigue   . 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 HIV (human immunodeficiency virus infection) ([place]) 02/2012  . Laceration of ankle, right 11/18/2012  . Lumbar radiculopathy   . Meningitis 02/18/2018  . Pelvic pain   . PID (acute pelvic inflammatory disease) 02/26/2018  . Reflux esophagitis 03/11/2012  . Tuberculosis   . 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 Vertigo     PAST SURGICAL HISTORY: Past Surgical History:  Procedure Laterality Date  . APPENDECTOMY  ~ 2000  . DILATION AND CURETTAGE OF UTERUS  2008  . ESOPHAGOGASTRODUODENOSCOPY  03/11/2012   Procedure: ESOPHAGOGASTRODUODENOSCOPY (EGD);  Surgeon: Lafayette Dragon, MD;  Location[facility] ENDOSCOPY;  Service: Endoscopy;  Laterality: N/A;  . ESOPHAGOGASTRODUODENOSCOPY N/A 03/07/2014   Procedure: ESOPHAGOGASTRODUODENOSCOPY (EGD);  Surgeon: Gatha Mayer, MD;  Location: [facility] ENDOSCOPY;  Service: Endoscopy;  Laterality:  N/A;  . LUNG BIOPSY  02/2012  . TOTAL HIP ARTHROPLASTY Left 12/14/2015   Procedure: LEFT TOTAL HIP ARTHROPLASTY ANTERIOR APPROACH;  Surgeon: Mcarthur Rossetti, MD;  Location: [facility] ORS;  Service: Orthopedics;  Laterality: Left;  . TOTAL HIP ARTHROPLASTY Right 04/04/2016   Procedure: RIGHT TOTAL HIP ARTHROPLASTY ANTERIOR APPROACH;  Surgeon: Mcarthur Rossetti, MD;  Location: [facility] ORS;  Service: Orthopedics;  Laterality: Right;    FAMILY HISTORY: Family History  Problem Relation Age of Onset  . Heart disease Father        Vague not clearly cardiac    SOCIAL HISTORY: Social History   Socioeconomic  History  . Marital status: Divorced    Spouse name: Not on file  . Number of children: 0  . Years of education: college  . Highest education level: Not on file  Occupational History  . Not on file  Tobacco Use  . Smoking status: Never Smoker  . Smokeless tobacco: Never Used  Substance and Sexual Activity  . Alcohol use: No    Alcohol/week: 0.0 standard drinks    Comment: socially  . Drug use: No  . Sexual activity: Yes    Partners: Male    Birth control/protection: None    Comment: pt. given condoms 11/2019  Other Topics Concern  . 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     Drinks about 1 soda a day    Social Determinants of Radio broadcast assistant Strain:   . Difficulty of Paying Living Expenses:   Food Insecurity:   . Worried About Charity fundraiser in the Last Year:   . Arboriculturist in the Last Year:   Transportation Needs:   . Film/video editor (Medical):   [name] Lack of Transportation (Non-Medical):   Physical Activity:   . Days of Exercise per Week:   . Minutes of Exercise per Session:   Stress:   . Feeling of Stress :   Social Connections:   . Frequency of Communication with Friends and Family:   . Frequency of Social Gatherings with Friends and Family:   . Attends Religious Services:   . Active Member of Clubs or Organizations:   . Attends Archivist Meetings:   [name] Marital Status:   Intimate Partner Violence:   . Fear of Current or Ex-Partner:   . Emotionally Abused:   [name] Physically Abused:   . Sexually Abused:       PHYSICAL EXAM  Vitals:   05/07/20 0922  BP: 125/84  Pulse: 93  Weight: 187 lb (84.8 kg)  Height: 5\' 5"  (1.651 m)   Body mass index is 31.12 kg/m.  Generalized: Well developed, in no acute distress   Neurological examination  Mentation: Alert oriented to time, place, history taking. Follows all commands speech and language fluent Cranial nerve II-XII: Pupils were equal  round reactive to light. Extraocular movements were full, visual field were full on confrontational test. Facial sensation and strength were normal. Uvula tongue midline. Head turning and shoulder shrug  were normal and symmetric. Motor: The motor testing reveals 5 over 5 strength of all 4 extremities. Good symmetric motor tone is noted throughout.  Sensory: Sensory testing is intact to soft touch on all 4 extremities. No evidence of extinction is noted.  Coordination: Cerebellar testing reveals good finger-nose-finger and heel-to-shin bilaterally.  Gait and station: Gait is normal. Tandem gait is normal. Romberg is  negative. No drift is seen.  Reflexes: Deep tendon reflexes are symmetric and normal bilaterally.   DIAGNOSTIC DATA (LABS, IMAGING, TESTING) - I reviewed patient records, labs, notes, testing and imaging myself where available.  Lab Results  Component Value Date   WBC 4.8 03/07/2020   HGB 11.0 (L) 03/07/2020   HCT 32.5 (L) 03/07/2020   MCV 92.3 03/07/2020   PLT 195 03/07/2020      Component Value Date/Time   NA 137 03/07/2020 1652   NA 138 01/04/2020 1148   K 4.0 03/07/2020 1652   CL 105 03/07/2020 1652   CO2 22 03/07/2020 1652   GLUCOSE 88 03/07/2020 1652   BUN 13 03/07/2020 1652   BUN 7 01/04/2020 1148   CREATININE 1.05 03/07/2020 1652   CALCIUM 9.8 03/07/2020 1652   PROT 8.5 (H) 02/23/2020 1046   PROT 8.2 01/04/2020 1148   ALBUMIN 4.2 02/23/2020 1046   ALBUMIN 4.8 01/04/2020 1148   AST 23 02/23/2020 1046   ALT 28 02/23/2020 1046   ALKPHOS 89 02/23/2020 1046   BILITOT 0.4 02/23/2020 1046   BILITOT 0.3 01/04/2020 1148   GFRNONAA >60 02/23/2020 1046   GFRNONAA 81 11/18/2019 1051   GFRAA >60 02/23/2020 1046   GFRAA 94 11/18/2019 1051   Lab Results  Component Value Date   CHOL 215 (H) 12/25/2016   HDL 67 12/25/2016   LDLCALC 119 (H) 12/25/2016   TRIG 144 12/25/2016   CHOLHDL 3.2 12/25/2016   Lab Results  Component Value Date   HGBA1C 5.1 04/20/2015    Lab Results  Component Value Date   VITAMINB12 >1500 (H) 10/30/2014   Lab Results  Component Value Date   TSH 0.532 02/14/2020      ASSESSMENT AND PLAN 29 y.o. year old female  has a past medical history of Acute lymphocytic meningitis (07/07/2013), Adrenal insufficiency ([place]), Anemia of chronic disease (03/11/2012), Back pain of lumbar region with sciatica (02/12/2015), Bell's palsy (08/26/2013), Bullae (05/30/2012), Chronic back pain, Chronic leg pain, Fatigue, Herpes simplex esophagitis (03/11/2012), HIV (human immunodeficiency virus infection) ([place]) (02/2012), Laceration of ankle, right (11/18/2012), Lumbar radiculopathy, Meningitis (02/18/2018), Pelvic pain, PID (acute pelvic inflammatory disease) (02/26/2018), Reflux esophagitis (03/11/2012), Tuberculosis, 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 here with:  1.  Worsening migraine headache with new symptom of tinnitus 2.  History of HIV 3.  Abnormal MRI of the brain  Discussed this patient symptoms with Dr. Okey Dupre will be sent for an MRA of the brain without contrast due to new symptom of tinnitus with worsening headache and previously abnormal MRI of the brain.  I will give her a Toradol 30 mg IM injection today for her headache.  She will restart amitriptyline 10 mg at bedtime-taking 1 tablet for 1 week then increasing to 2 tablets at bedtime thereafter advised that if her symptoms worsen or she develops new symptoms she should let us know.    I spent 40 minutes of face-to-face and non-face-to-face time with patient.  This included previsit chart review, lab review, study review, order entry, electronic health record documentation, patient education.  Ward Givens, MSN, NP-C 05/07/2020, 9:25 AM [facility] [address], [place],  [address] [phone]   I reviewed the above note and documentation by the Nurse Practitioner and agree with the history, exam, assessment and  plan as outlined above. I was available for consultation. Star Age, MD, PhD [name])

## 2020-05-07 NOTE — Progress Notes (Signed)
Received VO from [place], NP to give toradol 30mg  IM once today. Pt reports that she has received toradol before in the ER and she tolerated it.  Toradol 30mg  IM given in R deltoid using aseptic technique. Pt tolerated well. Pt will call clinic for questions concerns.

## 2020-05-07 NOTE — Patient Instructions (Signed)
Your Plan:  Toradol injection today MRA of brain Amtriptyline 25 mg at bedtime If your symptoms worsen or you develop new symptoms please let us know.    Thank you for coming to see [place] at [facility]. I hope we have been able to provide you high quality care today.  You may receive a patient satisfaction survey over the next few weeks. We would appreciate your feedback and comments so that we may continue to improve ourselves and the health of our patients.

## 2020-05-09 ENCOUNTER — Emergency Department (HOSPITAL_COMMUNITY): Payer: Medicaid Other

## 2020-05-09 ENCOUNTER — Encounter (HOSPITAL_COMMUNITY): Payer: Self-pay | Admitting: Emergency Medicine

## 2020-05-09 ENCOUNTER — Inpatient Hospital Stay (HOSPITAL_COMMUNITY)
Admission: EM | Admit: 2020-05-09 | Discharge: 2020-05-12 | DRG: 974 | Discharge status: Against Medical Advice | Payer: Medicaid Other | Attending: Family Medicine | Admitting: Family Medicine

## 2020-05-09 ENCOUNTER — Other Ambulatory Visit: Payer: Self-pay

## 2020-05-09 DIAGNOSIS — Z9114 Patient's other noncompliance with medication regimen: Secondary | ICD-10-CM

## 2020-05-09 DIAGNOSIS — Z885 Allergy status to narcotic agent status: Secondary | ICD-10-CM

## 2020-05-09 DIAGNOSIS — Z20822 Contact with and (suspected) exposure to covid-19: Secondary | ICD-10-CM | POA: Diagnosis present

## 2020-05-09 DIAGNOSIS — Z5329 Procedure and treatment not carried out because of patient's decision for other reasons: Secondary | ICD-10-CM | POA: Diagnosis not present

## 2020-05-09 DIAGNOSIS — G40909 Epilepsy, unspecified, not intractable, without status epilepticus: Secondary | ICD-10-CM | POA: Diagnosis not present

## 2020-05-09 DIAGNOSIS — B582 Toxoplasma meningoencephalitis: Secondary | ICD-10-CM

## 2020-05-09 DIAGNOSIS — M5416 Radiculopathy, lumbar region: Secondary | ICD-10-CM | POA: Diagnosis not present

## 2020-05-09 DIAGNOSIS — G939 Disorder of brain, unspecified: Secondary | ICD-10-CM | POA: Diagnosis not present

## 2020-05-09 DIAGNOSIS — Z8611 Personal history of tuberculosis: Secondary | ICD-10-CM

## 2020-05-09 DIAGNOSIS — B20 Human immunodeficiency virus [HIV] disease: Principal | ICD-10-CM | POA: Diagnosis present

## 2020-05-09 DIAGNOSIS — B589 Toxoplasmosis, unspecified: Secondary | ICD-10-CM | POA: Diagnosis not present

## 2020-05-09 DIAGNOSIS — Z96643 Presence of artificial hip joint, bilateral: Secondary | ICD-10-CM | POA: Diagnosis not present

## 2020-05-09 DIAGNOSIS — G8929 Other chronic pain: Secondary | ICD-10-CM | POA: Diagnosis present

## 2020-05-09 DIAGNOSIS — R519 Headache, unspecified: Secondary | ICD-10-CM | POA: Diagnosis not present

## 2020-05-09 DIAGNOSIS — K5900 Constipation, unspecified: Secondary | ICD-10-CM | POA: Diagnosis present

## 2020-05-09 DIAGNOSIS — G936 Cerebral edema: Secondary | ICD-10-CM | POA: Diagnosis present

## 2020-05-09 DIAGNOSIS — I10 Essential (primary) hypertension: Secondary | ICD-10-CM

## 2020-05-09 DIAGNOSIS — B5889 Toxoplasmosis with other organ involvement: Secondary | ICD-10-CM | POA: Diagnosis not present

## 2020-05-09 DIAGNOSIS — Z8249 Family history of ischemic heart disease and other diseases of the circulatory system: Secondary | ICD-10-CM

## 2020-05-09 LAB — COMPREHENSIVE METABOLIC PANEL
ALT: 17 U/L (ref 0–44)
AST: 22 U/L (ref 15–41)
Albumin: 3.7 g/dL (ref 3.5–5.0)
Alkaline Phosphatase: 76 U/L (ref 38–126)
Anion gap: 8 (ref 5–15)
BUN: 7 mg/dL (ref 6–20)
CO2: 21 mmol/L — ABNORMAL LOW (ref 22–32)
Calcium: 8.7 mg/dL — ABNORMAL LOW (ref 8.9–10.3)
Chloride: 108 mmol/L (ref 98–111)
Creatinine, Ser: 0.76 mg/dL (ref 0.44–1.00)
GFR calc Af Amer: >60 mL/min (ref >60)
GFR calc non Af Amer: >60 mL/min (ref >60)
Glucose, Bld: 84 mg/dL (ref 70–99)
Potassium: 4.3 mmol/L (ref 3.5–5.1)
Sodium: 137 mmol/L (ref 135–145)
Total Bilirubin: 0.4 mg/dL (ref 0.3–1.2)
Total Protein: 7.4 g/dL (ref 6.5–8.1)

## 2020-05-09 LAB — CBC WITH DIFFERENTIAL/PLATELET
Abs Immature Granulocytes: 0.01 10*3/uL (ref 0.00–0.07)
Basophils Absolute: 0 10*3/uL (ref 0.0–0.1)
Basophils Relative: 1 %
Eosinophils Absolute: 0.5 10*3/uL (ref 0.0–0.5)
Eosinophils Relative: 14 %
HCT: 38.7 % (ref 36.0–46.0)
Hemoglobin: 12.1 g/dL (ref 12.0–15.0)
Immature Granulocytes: 0 %
Lymphocytes Relative: 36 %
Lymphs Abs: 1.5 10*3/uL (ref 0.7–4.0)
MCH: 30 pg (ref 26.0–34.0)
MCHC: 31.3 g/dL (ref 30.0–36.0)
MCV: 95.8 fL (ref 80.0–100.0)
Monocytes Absolute: 0.5 10*3/uL (ref 0.1–1.0)
Monocytes Relative: 13 %
Neutro Abs: 1.4 10*3/uL — ABNORMAL LOW (ref 1.7–7.7)
Neutrophils Relative %: 36 %
Platelets: 189 10*3/uL (ref 150–400)
RBC: 4.04 MIL/uL (ref 3.87–5.11)
RDW: 16 % — ABNORMAL HIGH (ref 11.5–15.5)
WBC: 4 10*3/uL (ref 4.0–10.5)
nRBC: 0 % (ref 0.0–0.2)

## 2020-05-09 LAB — SARS CORONAVIRUS 2 BY RT PCR (HOSPITAL ORDER, PERFORMED IN ~~LOC~~ HOSPITAL LAB): SARS Coronavirus 2: NEGATIVE

## 2020-05-09 MED ORDER — ACETAMINOPHEN 500 MG PO TABS
1000.0000 mg | ORAL_TABLET | Freq: Four times a day (QID) | ORAL | Status: DC
  Administered 2020-05-10 – 2020-05-12 (×8): 1000 mg via ORAL
  Filled 2020-05-09 (×10): qty 2

## 2020-05-09 MED ORDER — OXYCODONE HCL 5 MG PO TABS
5.0000 mg | ORAL_TABLET | Freq: Four times a day (QID) | ORAL | Status: DC | PRN
  Administered 2020-05-10 (×2): 5 mg via ORAL
  Filled 2020-05-09 (×2): qty 1

## 2020-05-09 MED ORDER — SODIUM CHLORIDE 0.9 % IV SOLN: INTRAVENOUS | Status: DC

## 2020-05-09 MED ORDER — DIPHENHYDRAMINE HCL 25 MG PO CAPS
25.0000 mg | ORAL_CAPSULE | Freq: Once | ORAL | Status: AC
  Administered 2020-05-09: 25 mg via ORAL
  Filled 2020-05-09: qty 1

## 2020-05-09 MED ORDER — IBUPROFEN 200 MG PO TABS
400.0000 mg | ORAL_TABLET | Freq: Three times a day (TID) | ORAL | Status: DC
  Administered 2020-05-10 – 2020-05-11 (×6): 400 mg via ORAL
  Filled 2020-05-09 (×7): qty 2

## 2020-05-09 MED ORDER — DOLUTEGRAVIR SODIUM 50 MG PO TABS
50.0000 mg | ORAL_TABLET | Freq: Every day | ORAL | Status: DC
  Administered 2020-05-10 – 2020-05-12 (×4): 50 mg via ORAL
  Filled 2020-05-09 (×4): qty 1

## 2020-05-09 MED ORDER — DARUN-COBIC-EMTRICIT-TENOFAF 800-150-200-10 MG PO TABS
1.0000 | ORAL_TABLET | Freq: Every day | ORAL | Status: DC
  Administered 2020-05-10 – 2020-05-12 (×3): 1 via ORAL
  Filled 2020-05-09 (×5): qty 1

## 2020-05-09 MED ORDER — SULFAMETHOXAZOLE-TRIMETHOPRIM 400-80 MG/5ML IV SOLN
10.0000 mg/kg/d | Freq: Two times a day (BID) | INTRAVENOUS | Status: DC
  Administered 2020-05-09 – 2020-05-12 (×7): 422.56 mg via INTRAVENOUS
  Filled 2020-05-09 (×8): qty 26.41

## 2020-05-09 MED ORDER — GADOBUTROL 1 MMOL/ML IV SOLN
8.0000 mL | Freq: Once | INTRAVENOUS | Status: AC | PRN
  Administered 2020-05-09: 8 mL via INTRAVENOUS

## 2020-05-09 MED ORDER — OXYCODONE HCL 5 MG PO TABS
5.0000 mg | ORAL_TABLET | ORAL | Status: DC | PRN
Start: 2020-05-09 — End: 2020-05-09

## 2020-05-09 MED ORDER — OXYCODONE HCL 5 MG PO TABS
5.0000 mg | ORAL_TABLET | Freq: Once | ORAL | Status: AC
  Administered 2020-05-09: 5 mg via ORAL
  Filled 2020-05-09: qty 1

## 2020-05-09 MED ORDER — SODIUM CHLORIDE 0.9% FLUSH
10.0000 mL | Freq: Two times a day (BID) | INTRAVENOUS | Status: DC
  Administered 2020-05-10 – 2020-05-11 (×3): 10 mL

## 2020-05-09 MED ORDER — AMITRIPTYLINE HCL 10 MG PO TABS
10.0000 mg | ORAL_TABLET | Freq: Every day | ORAL | Status: DC
  Administered 2020-05-10 – 2020-05-11 (×3): 10 mg via ORAL
  Filled 2020-05-09 (×4): qty 1

## 2020-05-09 MED ORDER — LEVETIRACETAM 500 MG PO TABS
500.0000 mg | ORAL_TABLET | Freq: Two times a day (BID) | ORAL | Status: DC
  Administered 2020-05-10 – 2020-05-12 (×6): 500 mg via ORAL
  Filled 2020-05-09 (×6): qty 1

## 2020-05-09 MED ORDER — DIPHENHYDRAMINE HCL 50 MG/ML IJ SOLN
12.5000 mg | Freq: Once | INTRAMUSCULAR | Status: DC
  Filled 2020-05-09: qty 1

## 2020-05-09 MED ORDER — ENOXAPARIN SODIUM 40 MG/0.4ML ~~LOC~~ SOLN
40.0000 mg | SUBCUTANEOUS | Status: DC
  Administered 2020-05-10 – 2020-05-11 (×3): 40 mg via SUBCUTANEOUS
  Filled 2020-05-09 (×3): qty 0.4

## 2020-05-09 MED ORDER — KETOROLAC TROMETHAMINE 30 MG/ML IJ SOLN
15.0000 mg | Freq: Once | INTRAMUSCULAR | Status: AC
  Administered 2020-05-09: 15 mg via INTRAVENOUS
  Filled 2020-05-09: qty 1

## 2020-05-09 MED ORDER — POLYETHYLENE GLYCOL 3350 17 G PO PACK
17.0000 g | PACK | Freq: Every day | ORAL | Status: DC
  Administered 2020-05-10: 17 g via ORAL
  Filled 2020-05-09 (×3): qty 1

## 2020-05-09 MED ORDER — PANTOPRAZOLE SODIUM 40 MG PO TBEC
40.0000 mg | DELAYED_RELEASE_TABLET | Freq: Every day | ORAL | Status: DC
  Administered 2020-05-10 – 2020-05-12 (×4): 40 mg via ORAL
  Filled 2020-05-09 (×4): qty 1

## 2020-05-09 MED ORDER — SODIUM CHLORIDE 0.9% FLUSH: 10.0000 mL | INTRAVENOUS | Status: DC | PRN

## 2020-05-09 MED ORDER — SENNA 8.6 MG PO TABS
1.0000 | ORAL_TABLET | Freq: Two times a day (BID) | ORAL | Status: DC
  Administered 2020-05-10 – 2020-05-12 (×5): 8.6 mg via ORAL
  Filled 2020-05-09 (×6): qty 1

## 2020-05-09 MED ORDER — PROCHLORPERAZINE EDISYLATE 10 MG/2ML IJ SOLN
10.0000 mg | Freq: Once | INTRAMUSCULAR | Status: AC
  Administered 2020-05-09: 10 mg via INTRAVENOUS
  Filled 2020-05-09: qty 2

## 2020-05-09 NOTE — ED Provider Notes (Signed)
[facility] EMERGENCY DEPARTMENT Provider Note   CSN: [id number] Arrival date & time: 05/09/20  0554     History Chief Complaint  Patient presents with  . Headache    Kelsey Rodriguez is a 29 y.o. female.  HPI She presents for ongoing headache, severe, 8-10 over 10, for 1 week.  She saw her neurologist for same problem 3 days ago and was scheduled for an outpatient MRI to be done in 2 weeks.  Patient complains of a odd sense of hearing, with voice is sounding different than usual, and a noise in her left ear which she has difficulty describing.  When she saw the neurologist she was treated with IM Toradol, and a prescription for amitriptyline to take at nighttime which the patient has begun.  These changes have not improved her headaches.  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 Adrenal insufficiency ([place])   . Anemia of chronic disease 03/11/2012  . Back pain of lumbar region with sciatica 02/12/2015  . Bell's palsy 08/26/2013  . 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 Chronic back pain   . Chronic leg pain    bilateral knees, ankles  . Fatigue   . 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 HIV (human immunodeficiency virus infection) ([place]) 02/2012  . Laceration of ankle, right 11/18/2012  . Lumbar radiculopathy   . Meningitis 02/18/2018  . Pelvic pain   . PID (acute pelvic inflammatory disease) 02/26/2018  . Reflux esophagitis 03/11/2012  . Tuberculosis   . 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 Vertigo     Patient Active Problem List   Diagnosis Date Noted  . Pain of upper abdomen   . Suicide ideation   . 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 Seizure ([place])   . Intracranial mass   . Intractable headache 02/04/2020  . Encephalitis, myelitis, and encephalomyelitis  ([place]) 01/31/2020  . Rotator cuff strain 01/26/2020  . 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 AIDS (acquired immune deficiency syndrome) ([place]) 11/07/2019  . Cerebral edema ([place]) 10/28/2019  . Midline shift of brain   . Pruritus 08/29/2019  . Tendinopathy of left shoulder 01/18/2019  . Chronic pelvic pain in female 01/04/2019  . Lower abdominal pain 06/21/2018  . Syphilis 02/26/2018  . Tuberculosis   . Infertility, female 02/12/2018  . 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 Acute right-sided low back pain with right-sided sciatica 08/24/2017  . Complex regional pain syndrome 02/03/2017  . Headache 10/28/2016  . Avascular necrosis of bone of right hip ([place]) 04/04/2016  . 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 Avascular necrosis of bone of left hip ([place]) 12/14/2015  . 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 Nausea 05/18/2015  . Vertigo 01/23/2015  . Primary adrenal insufficiency ([place]) 01/03/2015  . Chest pain 07/07/2013  . HIV (human immunodeficiency virus infection) ([place]) 03/16/2012  . 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 APPENDECTOMY  ~ 2000  . DILATION AND CURETTAGE OF UTERUS  2008  . ESOPHAGOGASTRODUODENOSCOPY  03/11/2012   Procedure: ESOPHAGOGASTRODUODENOSCOPY (EGD);  Surgeon: Lafayette Dragon, MD;  Location: [facility] ENDOSCOPY;  Service: Endoscopy;  Laterality: N/A;  . ESOPHAGOGASTRODUODENOSCOPY N/A 03/07/2014   Procedure: ESOPHAGOGASTRODUODENOSCOPY (  EGD);  Surgeon: Gatha Mayer, MD;  Location: [facility];  Service: Endoscopy;  Laterality: N/A;  . LUNG BIOPSY  02/2012  . TOTAL HIP ARTHROPLASTY Left 12/14/2015   Procedure: LEFT TOTAL HIP ARTHROPLASTY ANTERIOR APPROACH;  Surgeon: Mcarthur Rossetti, MD;  Location: [facility];  Service: Orthopedics;  Laterality: Left;  . TOTAL HIP ARTHROPLASTY Right 04/04/2016   Procedure: RIGHT TOTAL HIP ARTHROPLASTY ANTERIOR  APPROACH;  Surgeon: Mcarthur Rossetti, MD;  Location: [facility];  Service: Orthopedics;  Laterality: Right;     OB History    Gravida  1   Para  0   Term  0   Preterm  0   AB  1   Living  0     SAB  1   TAB  0   Ectopic  0   Multiple  0   Live Births              Family History  Problem Relation Age of Onset  . Heart disease Father        Vague not clearly cardiac    Social History   Tobacco Use  . Smoking status: Never Smoker  . Smokeless tobacco: Never Used  Substance Use Topics  . Alcohol use: No    Alcohol/week: 0.0 standard drinks    Comment: socially  . Drug use: No    Home Medications Prior to Admission medications   Medication Sig Start Date End Date Taking? Authorizing Provider  amitriptyline (ELAVIL) 10 MG tablet Take 1 tablet PO at night for 1 week then increase to 2 tablets thereafter Patient taking differently: Take 10 mg by mouth. Take 1 tablet PO at night for 1 week then increase to 2 tablets thereafter 05/07/20  Yes Ward Givens, NP  Darunavir-Cobicisctat-Emtricitabine-Tenofovir Alafenamide (SYMTUZA) 800-150-200-10 MG TABS Take 1 tablet by mouth daily with breakfast. 03/22/20  Yes Carlyle Basques, MD  dolutegravir (TIVICAY) 50 MG tablet Take 1 tablet (50 mg total) by mouth daily. 03/22/20  Yes Carlyle Basques, MD  levETIRAcetam (KEPPRA) 500 MG tablet Take 1 tablet (500 mg total) by mouth 2 (two) times daily. 03/22/20  Yes Carlyle Basques, MD  naproxen (NAPROSYN) 500 MG tablet Take 1 tablet (500 mg total) by mouth 2 (two) times daily with a meal. 02/23/20  Yes Caccavale, Sophia, PA-C  pantoprazole (PROTONIX) 40 MG tablet Take 1 tablet (40 mg total) by mouth daily. 02/20/20 03/21/20  Lattie Haw, MD  sulfamethoxazole-trimethoprim (BACTRIM DS) 800-160 MG tablet Take 1 tablet by mouth 2 (two) times daily. Patient not taking: Reported on 05/09/2020 03/22/20   Carlyle Basques, MD  SUMAtriptan (IMITREX) 50 MG tablet Take 1 tablet (50 mg total) by mouth  every 2 (two) hours as needed for migraine (Maximum dose: 100 mg per dose; 200 mg per 24 hours). May repeat in 2 hours if headache persists or recurs. 10/26/19 01/31/20  Caroline More, DO    Allergies    Hydrocodone and Tramadol  Review of Systems   Review of Systems  Physical Exam Updated Vital Signs BP 126/81 (BP Location: Right Arm)   Pulse 95   Temp 98.9 F (37.2 C) (Oral)   Resp 19   Ht 5\' 5"  (1.651 m)   Wt 84.5 kg   SpO2 100%   BMI 31.00 kg/m   Physical Exam  ED Results / Procedures / Treatments   Labs (all labs ordered are listed, but only abnormal results are displayed) Labs Reviewed  SARS CORONAVIRUS 2 BY RT PCR [facility]  ORDER, PERFORMED IN [facility])  COMPREHENSIVE METABOLIC PANEL  CBC WITH DIFFERENTIAL/PLATELET    EKG None  Radiology MR Brain W and Wo Contrast  Result Date: 05/09/2020 CLINICAL DATA:  Intracranial toxoplasmosis.  HIV EXAM: MRI HEAD WITHOUT AND WITH CONTRAST TECHNIQUE: Multiplanar, multiecho pulse sequences of the brain and surrounding structures were obtained without and with intravenous contrast. CONTRAST:  62mL GADAVIST GADOBUTROL 1 MMOL/ML IV SOLN COMPARISON:  MRI head 02/20/2020, 02/04/2020 FINDINGS: Brain: Enhancing mass lesion in the right temporal lobe shows interval enlargement from the most recent study and is larger compared with the study of 02/04/2020 and also 10/28/2019. The mass now measures approximately 39 x 23 x 21 mm. There is central nonenhancing necrosis within the mass. No restricted diffusion in the mass. There is significantly increased vasogenic edema in the right temporal lobe with increased mass-effect. There is now 6 mm midline shift to the left. No shift on the prior study. There is compression of the right lateral ventricle. No second enhancing lesion identified. No associated hemorrhage or infarction. Vascular: Normal arterial flow voids. Skull and upper cervical spine: No focal skeletal lesion  Sinuses/Orbits: Mild mucosal edema paranasal sinuses.  Normal orbit Other: None IMPRESSION: Interval enlargement of enhancing mass lesion right temporal lobe with central necrosis. Considerable progression of surrounding vasogenic edema and mass-effect now with 6 mm midline shift. No second lesion identified. Electronically Signed   By: Franchot Gallo M.D.   On: 05/09/2020 14:09    Procedures .Critical Care Performed by: Daleen Bo, MD Authorized by: Daleen Bo, MD   Critical care provider statement:    Critical care time (minutes):  35   Critical care start time:  05/09/2020 10:50 AM   Critical care end time:  05/09/2020 2:55 PM   Critical care time was exclusive of:  Separately billable procedures and treating other patients   Critical care was necessary to treat or prevent imminent or life-threatening deterioration of the following conditions:  CNS failure or compromise   Critical care was time spent personally by me on the following activities:  Blood draw for specimens, development of treatment plan with patient or surrogate, discussions with consultants, evaluation of patient's response to treatment, examination of patient, obtaining history from patient or surrogate, ordering and performing treatments and interventions, ordering and review of laboratory studies, pulse oximetry, re-evaluation of patient's condition, review of old charts and ordering and review of radiographic studies   (including critical care time)  Medications Ordered in ED Medications  0.9 %  sodium chloride infusion ( Intravenous Incomplete 05/09/20 1410)  sodium chloride flush (NS) 0.9 % injection 10-40 mL (has no administration in time range)  sodium chloride flush (NS) 0.9 % injection 10-40 mL (has no administration in time range)  prochlorperazine (COMPAZINE) injection 10 mg (10 mg Intravenous Given 05/09/20 1126)  ketorolac (TORADOL) 30 MG/ML injection 15 mg (15 mg Intravenous Given 05/09/20 1127)    diphenhydrAMINE (BENADRYL) capsule 25 mg (25 mg Oral Given 05/09/20 1129)  gadobutrol (GADAVIST) 1 MMOL/ML injection 8 mL (8 mLs Intravenous Contrast Given 05/09/20 1355)    ED Course  I have reviewed the triage vital signs and the nursing notes.  Pertinent labs & imaging results that were available during my care of the patient were reviewed by me and considered in my medical decision making (see chart for details).  Clinical Course as of May 09 1453  Wed May 09, 2020  1107 I discussed case with Dr. Sandi Mariscal, radiologist who recommends  imaging for toxoplasmosis with MRI brain with and without contrast.   [EW]  1451 Case discussed with infectious disease, Dr. Baxter Flattery who will see the patient in the ED for management and disposition.   [EW]    Clinical Course User Index [EW] Daleen Bo, MD   MDM Rules/Calculators/A&P                       Patient Vitals for the past 24 hrs:  BP Temp Temp src Pulse Resp SpO2 Height Weight  05/09/20 0603 -- -- -- -- -- -- 5\' 5"  (1.651 m) 84.5 kg  05/09/20 0602 126/81 98.9 F (37.2 C) Oral 95 19 100 % -- --    2:22 PM Reevaluation with update and discussion. After initial assessment and treatment, an updated evaluation reveals she reports minimal improvement after treatment with migraine cocktail.  Patient updated on findings and plan to call infectious disease for consultation. Daleen Bo   Medical Decision Making:  This patient is presenting for evaluation of persistent headache for least a week., which does require a range of treatment options, and is a complaint that involves a moderate risk of morbidity and mortality. The differential diagnoses include recurrent toxoplasmosis brain, migraine, tension headache. I decided to review old records, and in summary patient with history of HIV disease and intracranial toxoplasmosis.  I did not require additional historical information from anyone.  Clinical Laboratory Tests Ordered, included CBC  and Metabolic panel. Review indicates laboratory tests are normal. Radiologic Tests Ordered, included MRI brain with and without to evaluate for toxoplasmosis.  I independently Visualized: Radiographic images, which show progression of toxoplasmosis lesion with surrounding vasogenic edema.   Critical Interventions-clinical evaluation, MRI imaging, observation and reassessment. Admission labs ordered  After These Interventions, the Patient was reevaluated and was found with persistent headache, despite treatment. She requires evaluation and likely admission by infectious disease with probability for additional consultation because of midline shift secondary to edema.  CRITICAL CARE-yes Performed by: Daleen Bo  Nursing Notes Reviewed/ Care Coordinated Applicable Imaging Reviewed Interpretation of Laboratory Data incorporated into ED treatment   Plan-as per infectious disease consultant    Final Clinical Impression(s) / ED Diagnoses Final diagnoses:  Toxoplasmosis  Intractable headache, unspecified chronicity pattern, unspecified headache type    Rx / DC Orders ED Discharge Orders    None       Daleen Bo, MD 05/11/20 [id number]

## 2020-05-09 NOTE — ED Notes (Signed)
C/o a hedache

## 2020-05-09 NOTE — H&P (Addendum)
[facility] Admission History and Physical Service Pager: [phone]  Patient name: Kelsey Rodriguez Medical record number: [id number] Date of birth: 03-30-1991 Age: 29 y.o. Gender: female  Primary Care Provider: Gerlene Fee, DO Consultants: ID Code Status: FULL Preferred Emergency Contact: Husband and Mom  Chief Complaint: worsening headache  Assessment and Plan: Kelsey [facility] is a 29 y.o. female presenting with worsening headache. PMH is significant for HIV,CNS toxoplasmosis, chronic headaches and TB.  Intractable headache 2/2 space occupying lesion suspected to be toxoplasmosis Kelsey Rodriguez is a 29 yr old female who presents today for acute worsening of her chronic headache. Her headache acutely worsened 1 week ago with a severe "pressure" and "ache"  type pain worse over the right fronto-temporal region with new onset tinnitus in left ear>right ear. Of note, patient has missed last 2 ID appointments and is nonadherent with her ART meds missing several doses every week. Seen by neurology 2 weeks ago for on going headache and she had MRI brain was pending as outpatient. On exam today: pt has significant photophobia but overall cranial nerve, strength, sensation, reflexes exam normal. Labs: CMP wnl except Ca 8.7 and CBC w/ diff wnl except mild reduced neutrophils, glucose 84. COVID negative. MRI brain w wo contrast: Interval enlargement of enhancing mass lesion right temporal lobe with central necrosis. Considerable progression of surrounding vasogenic edema and mass-effect now with 6 mm midline shift. No second lesion identified. DDx is broad at this point.  Toxoplasmosis Vs primary CNS lymphoma Vs miliary Tb.  Could be Toxoplasmosis as pt has had a recent history of this and the known mass has increased in size, however no other ring enhancing lesions identified which are common with toxoplasmosis. She also completed treatment and was on maintenance therapy. Could  be primary CNS lymphoma as lesion is solitary and common in HIV patients with immunocompromise, however unable to differentiate without tissue biopsy (LP contraindicated with signs of elevated ICP). Centralized necrosis makes this less likely diagnosis. Also consider glioblastoma? Considered Miliary Tb as patient has a history of TB but lacks multiple small lesions on MRI. ID are following who restarted pt on Bactrim for presumed toxoplasmosis (pt is on maintenance therapy as an outpatient). CD4 count and HIV viral load pending. If undetectable viral load there will be a higher suspicion for lymphoma and pt will require tissue biopsy and culture. Pt received migraine cocktail (Toradol, benadryl and compazine) in the ER with minimal relief of headache. Have prescribed oxycodone 5mg  as headache worsened whilst we were present at bedside. Will continue to treat patient symptomatically while waiting for HIV labs to return.  -Admit to [facility], [place], attending [facility] -Vitals per floor routine -Up with assistance -ID following, appreciate recommendations -Continue Bactrim 10mg /kg/day BID per ID +maintenance IV fluids -Follow up CD4 count and HIV viral load  -Monitor headache - seizure precautions -Tylenol 1000mg  Q6H -Oxycodone 5mg  Q6PRN -Ibuprofen 400mg  TID  -Continue Amitriptyline  -PT/OT evaluation  -Consider neurology/neurosurgery consult am  - consider steroids for symptomatic relief although contraindicated in suspected lymphoma and can interfere with biopsy results  HIV  AIDS Last HIV quant on 03/07/20: 693, 2.84. Last CD4 count on 03/07/20: 45. CD4% helper t cell: 4. Pt admits to missing several doses of ART on a weekly basis. Home meds: Tivicay 50 mg daily, Symtuza daily   -ID following, appreciate recommendations -Continue home antiretroviral therapy -Repeat CD4 count and HIV quant   Seizure disorder No recent seizures reported Home meds: Keppra  500mg  BID  -Continue Keppra    Constipation Pt reports feeling constipated chronically with lower abdominal pain. Last BM was this morning and abdominal pain improved slightly. She also has recent history of rectal bleeding and has a history of hemorrhoids.  -Senna once daily -Miralax once daily   FEN/GI: Regular diet  Prophylaxis: Lovenox  Disposition: Med Surg   History of Present Illness:  Kelsey Rodriguez is a 29 y.o. female presenting with acute worsening of chronic headache.  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 1 week ago her usual headache suddenly became acutely worse and has been constant since then increasing in severity. Character of the pain is "aching" and "pressure like" which is radiating across her head and she feels the pain all over. She endorses new onset tinnitus in both ears which is worse left>right. Denies hearing loss. Severity has varied from 7/10-10/10. Has taken Naproxen, Ibuprofen and Tylenol at home without relief. Standing or moving exacerbates pain. Endorses photophobia, dizziness and nausea especially when going from laying to standing. Also endorses subjective fevers at home and her husband said she felt warm to him. Denies chest pain, SOB, cough, palpitations, vomiting, visual changes, head injury falls or balance issues. Endorses some lower abdominal pain which she feels may be linked to constipation. Last had a BM this morning and felt better afterwards. Has a history of hemorrhoids and recently had some rectal bleeding. Is not currently menstruating. Reports poor compliance with HIV medications and misses doses 2-3 times a week. Did not attend previous ID appointment in April and has an appointment in June coming up.   In the ER: Received migraine cocktail without significant relief and started on Bactrim for treatment of presumed toxoplasmosis.  Review Of Systems: Per HPI with the following additions:  Review of Systems   Patient Active Problem List    Diagnosis Date Noted  . 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 Hypertension   . Pain of upper abdomen   . Suicide ideation   . 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[place])   . Seizure ([place])   . Intracranial mass   . Intractable headache 02/04/2020  . Encephalitis, myelitis, 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[place]) 01/31/2020  . Rotator cuff strain 01/26/2020  . 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 AIDS (acquired immune deficiency syndrome) ([place]) 11/07/2019  . Cerebral edema ([place]) 10/28/2019  . Midline shift of brain   . Pruritus 08/29/2019  . Tendinopathy of left shoulder 01/18/2019  . Chronic pelvic pain in female 01/04/2019  . Lower abdominal pain 06/21/2018  . Syphilis 02/26/2018  . Tuberculosis   . Infertility, female 02/12/2018  . 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 Acute right-sided low back pain with right-sided sciatica 08/24/2017  . Complex regional pain syndrome 02/03/2017  . Headache 10/28/2016  . Avascular necrosis of bone of right hip ([place]) 04/04/2016  . 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 Avascular necrosis of bone of left hip ([place]) 12/14/2015  . 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 Nausea 05/18/2015  . Vertigo 01/23/2015  . Primary adrenal insufficiency ([place]) 01/03/2015  . Chest pain 07/07/2013  . HIV (human immunodeficiency virus infection) ([place]) 03/16/2012  . 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 Adrenal insufficiency ([place])   . Anemia of chronic disease 03/11/2012  . Back pain  of lumbar region with sciatica 02/12/2015  . Bell's palsy 08/26/2013  . 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 Chronic back pain   . Chronic leg pain    bilateral knees, ankles  . Fatigue   . 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 HIV (human immunodeficiency virus infection) ([place]) 02/2012   . Laceration of ankle, right 11/18/2012  . Lumbar radiculopathy   . Meningitis 02/18/2018  . Pelvic pain   . PID (acute pelvic inflammatory disease) 02/26/2018  . Reflux esophagitis 03/11/2012  . Tuberculosis   . 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 Vertigo     Past Surgical History: Past Surgical History:  Procedure Laterality Date  . APPENDECTOMY  ~ 2000  . DILATION AND CURETTAGE OF UTERUS  2008  . ESOPHAGOGASTRODUODENOSCOPY  03/11/2012   Procedure: ESOPHAGOGASTRODUODENOSCOPY (EGD);  Surgeon: [place] Dragon, MD;  [facility];  Service: Endoscopy;  Laterality: N/A;  . ESOPHAGOGASTRODUODENOSCOPY N/A 03/07/2014   Procedure: ESOPHAGOGASTRODUODENOSCOPY (EGD);  Surgeon: Gatha Mayer, MD;  Location[facility] ENDOSCOPY;  Service: Endoscopy;  Laterality: N/A;  . LUNG BIOPSY  02/2012  . TOTAL HIP ARTHROPLASTY Left 12/14/2015   Procedure: LEFT TOTAL HIP ARTHROPLASTY ANTERIOR APPROACH;  Surgeon: Mcarthur Rossetti, MD;  Location: [facility] ORS;  Service: Orthopedics;  Laterality: Left;  . TOTAL HIP ARTHROPLASTY Right 04/04/2016   Procedure: RIGHT TOTAL HIP ARTHROPLASTY ANTERIOR APPROACH;  Surgeon: Mcarthur Rossetti, MD;  Location: [facility] ORS;  Service: Orthopedics;  Laterality: Right;    Social History: Social History   Tobacco Use  . Smoking status: Never Smoker  . Smokeless tobacco: Never Used  Substance Use Topics  . Alcohol use: No    Alcohol/week: 0.0 standard drinks    Comment: socially  . Drug use: No   Additional social history:  Please also refer to relevant sections of EMR.  Family History: Family History  Problem Relation Age of Onset  . Heart disease Father        Vague not clearly cardiac    Allergies and Medications: Allergies  Allergen Reactions  . Hydrocodone Itching and Nausea Only    Tolerates Oxycodone  . Tramadol Itching and Nausea Only    Tolerates oxycodone   No current facility-administered medications on file prior to encounter.    Current Outpatient Medications on File Prior to Encounter  Medication Sig Dispense Refill  . amitriptyline (ELAVIL) 10 MG tablet Take 1 tablet PO at night for 1 week then increase to 2 tablets thereafter (Patient taking differently: Take 10 mg by mouth. Take 1 tablet PO at night for 1 week then increase to 2 tablets thereafter) 60 tablet 3  . Darunavir-Cobicisctat-Emtricitabine-Tenofovir Alafenamide (SYMTUZA) 800-150-200-10 MG TABS Take 1 tablet by mouth daily with breakfast. 30 tablet 11  . dolutegravir (TIVICAY) 50 MG tablet Take 1 tablet (50 mg total) by mouth daily. 30 tablet 11  . levETIRAcetam (KEPPRA) 500 MG tablet Take 1 tablet (500 mg total) by mouth 2 (two) times daily. 60 tablet 3  . naproxen (NAPROSYN) 500 MG tablet Take 1 tablet (500 mg total) by mouth 2 (two) times daily with a meal. 14 tablet 0  . pantoprazole (PROTONIX) 40 MG tablet Take 1 tablet (40 mg total) by mouth daily. 30 tablet 0  . sulfamethoxazole-trimethoprim (BACTRIM DS) 800-160 MG tablet Take 1 tablet by mouth 2 (two) times daily. (Patient not taking: Reported on 05/09/2020) 60 tablet 3  . [DISCONTINUED] SUMAtriptan (IMITREX) 50 MG tablet Take 1 tablet (50 mg total) by mouth every 2 (two)  hours as needed for migraine (Maximum dose: 100 mg per dose; 200 mg per 24 hours). May repeat in 2 hours if headache persists or recurs. 10 tablet 0    Objective: BP 127/86 (BP Location: Right Arm)   Pulse 83   Temp 99.2 F (37.3 C) (Oral)   Resp 16   Ht 5\' 5"  (1.651 m)   Wt 84.5 kg   SpO2 100%   BMI 31.00 kg/m   Exam: General: 29 yr old AA female lying in dimly lit room, moderate distress due to headache, kept eyes closed most of encounter  Eyes: Normal EOM, no scleral icterus  ENTM: No pharyngeal, no lymphadenopathy, no thyromegaly  Neck: Supple, normal ROM, no neck stiffness  Cardiovascular: S1 and S2 present, RRR  Respiratory: CTAB, normal WOB, no respiratory distress Gastrointestinal: Abdomen soft, non distended,  mild tenderness in lower pelvis, no guarding, bowel sounds present  MSK: No obvious deformities  Derm: warm and dry Neuro: Pt experienced significant photophobia during exam but cranial nerve exam normal. 5/5 strength upper and lower extremities, normal sensation throughout, biceps and tricep reflex 2+, able to illicit left patella reflex but not right patella reflex   Psych: normal mood, normal affect  Labs and Imaging: CBC BMET  Recent Labs  Lab 05/09/20 1532  WBC 4.0  HGB 12.1  HCT 38.7  PLT 189   Recent Labs  Lab 05/09/20 1532  NA 137  K 4.3  CL 108  CO2 21*  BUN 7  CREATININE 0.76  GLUCOSE 84  CALCIUM 8.7*     EKG:   Lattie Haw, MD 05/09/2020, 11:04 PM PGY-1, [place] Intern pager: [phone], text pages welcome  [place]    I have seen and examined this patient.     I have discussed the findings and exam with the intern and agree with the above note, which I have edited appropriately in [place]. I helped develop the management plan that is described in the resident's note, and I agree with the content.   Doristine Mango, DO PGY-2 Family Medicine Resident

## 2020-05-09 NOTE — Consult Note (Signed)
[facility]         Reason for Consult:brain mass    Referring Physician: Eulis Foster  Active Problems:   * No active hospital problems. *    HPI: Kelsey [facility] is a 29 y.o. female with hx of advanced hiv disease, who was recently hospitalized for presumed CNS toxo and treated for induction and now on maintenance with bactrim. She reports continuing to take her medication. We last saw her in ID clinic in early April. She has noticed worsening headache but now also noticing tinnitus predominantly left > right. No other weakness. In reviewing her mri from today vs march brain lesions is larger with worsening edema, and midline shift effacement of ventricle. No neuro deficits that she reports  Past Medical History:  Diagnosis Date  . 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 Adrenal insufficiency ([place])   . Anemia of chronic disease 03/11/2012  . Back pain of lumbar region with sciatica 02/12/2015  . Bell's palsy 08/26/2013  . 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 Chronic back pain   . Chronic leg pain    bilateral knees, ankles  . Fatigue   . 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 HIV (human immunodeficiency virus infection) ([place]) 02/2012  . Laceration of ankle, right 11/18/2012  . Lumbar radiculopathy   . Meningitis 02/18/2018  . Pelvic pain   . PID (acute pelvic inflammatory disease) 02/26/2018  . Reflux esophagitis 03/11/2012  . Tuberculosis   . 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 Vertigo     Allergies:  Allergies  Allergen Reactions  . Hydrocodone Itching and Nausea Only    Tolerates Oxycodone  . Tramadol Itching and Nausea Only    Tolerates oxycodone    Current antibiotics:   MEDICATIONS: . sodium chloride flush  10-40 mL Intracatheter Q12H    Social History   Tobacco Use  . Smoking status: Never Smoker  . Smokeless tobacco: Never Used  Substance Use Topics  . Alcohol use: No    Alcohol/week: 0.0 standard drinks    Comment: socially  .  Drug use: No    Family History  Problem Relation Age of Onset  . Heart disease Father        Vague not clearly cardiac    Review of Systems  Constitutional: Negative for fever, chills, diaphoresis, activity change, appetite change, fatigue and unexpected weight change.  HENT: Negative for congestion, sore throat, rhinorrhea, sneezing, trouble swallowing and sinus pressure.  Eyes: Negative for photophobia and visual disturbance.  Respiratory: Negative for cough, chest tightness, shortness of breath, wheezing and stridor.  Cardiovascular: Negative for chest pain, palpitations and leg swelling.  Gastrointestinal: Negative for nausea, vomiting, abdominal pain, diarrhea, constipation, blood in stool, abdominal distention and anal bleeding.  Genitourinary: Negative for dysuria, hematuria, flank pain and difficulty urinating.  Musculoskeletal: Negative for myalgias, back pain, joint swelling, arthralgias and gait problem.  Skin: Negative for color change, pallor, rash and wound.  Neurological: Negative for dizziness, tremors, weakness and light-headedness.  Hematological: Negative for adenopathy. Does not bruise/bleed easily.  Psychiatric/Behavioral: Negative for behavioral problems, confusion, sleep disturbance, dysphoric mood, decreased concentration and agitation.     OBJECTIVE: Temp:  [98.9 F (37.2 C)] 98.9 F (37.2 C) (05/26 0602) Pulse Rate:  [70-95] 72 (05/26 1500) Resp:  [19] 19 (05/26 0602) BP: (117-128)/(81-99) 127/85 (05/26 1730) SpO2:  [100 %] 100 % (05/26 1500) Weight:  [84.5 kg] 84.5 kg (05/26 0603) Physical Exam  Constitutional:  oriented to person,  place, and time. appears well-developed and well-nourished. No distress.  HENT: [place]/AT, PERRLA, no scleral icterus Mouth/Throat: Oropharynx is clear and moist. No oropharyngeal exudate.  Cardiovascular: Normal rate, regular rhythm and normal heart sounds. Exam reveals no gallop and no friction rub.  No murmur heard.    Pulmonary/Chest: Effort normal and breath sounds normal. No respiratory distress.  has no wheezes.  Neck = supple, no nuchal rigidity Abdominal: Soft. Bowel sounds are normal.  exhibits no distension. There is no tenderness.  Lymphadenopathy: no cervical adenopathy. No axillary adenopathy Neurological: alert and oriented to person, place, and time.  Skin: Skin is warm and dry. No rash noted. No erythema.  Psychiatric: a normal mood and affect.  behavior is normal.    LABS: Results for orders placed or performed during the hospital encounter of 05/09/20 (from the past 48 hour(s))  SARS Coronavirus 2 by RT PCR (hospital order, performed in [facility] hospital lab) Nasopharyngeal Nasopharyngeal Swab     Status: None   Collection Time: 05/09/20  2:59 PM   Specimen: Nasopharyngeal Swab  Result Value Ref Range   SARS Coronavirus 2 NEGATIVE NEGATIVE    Comment: (NOTE) SARS-CoV-2 target nucleic acids are NOT DETECTED. The SARS-CoV-2 RNA is generally detectable in upper and lower respiratory specimens during the acute phase of infection. The lowest concentration of SARS-CoV-2 viral copies this assay can detect is 250 copies / mL. A negative result does not preclude SARS-CoV-2 infection and should not be used as the sole basis for treatment or other patient management decisions.  A negative result may occur with improper specimen collection / handling, submission of specimen other than nasopharyngeal swab, presence of viral mutation(s) within the areas targeted by this assay, and inadequate number of viral copies (<250 copies / mL). A negative result must be combined with clinical observations, patient history, and epidemiological information. Fact Sheet for Patients:   StrictlyIdeas.no Fact Sheet for Healthcare Providers: BankingDealers.co.za This test is not yet approved or cleared  by the [place] FDA and has been authorized for  detection and/or diagnosis of SARS-CoV-2 by FDA under an Emergency Use Authorization (EUA).  This EUA will remain in effect (meaning this test can be used) for the duration of the COVID-19 declaration under Section 564(b)(1) of the Act, 21 U.S.C. section 360bbb-3(b)(1), unless the authorization is terminated or revoked sooner. Performed at [facility], [place] [address], [place], [place] [address]   Comprehensive metabolic panel     Status: Abnormal   Collection Time: 05/09/20  3:32 PM  Result Value Ref Range   Sodium 137 135 - 145 mmol/L   Potassium 4.3 3.5 - 5.1 mmol/L   Chloride 108 98 - 111 mmol/L   CO2 21 (L) 22 - 32 mmol/L   Glucose, Bld 84 70 - 99 mg/dL    Comment: Glucose reference range applies only to samples taken after fasting for at least 8 hours.   BUN 7 6 - 20 mg/dL   Creatinine, Ser 0.76 0.44 - 1.00 mg/dL   Calcium 8.7 (L) 8.9 - 10.3 mg/dL   Total Protein 7.4 6.5 - 8.1 g/dL   Albumin 3.7 3.5 - 5.0 g/dL   AST 22 15 - 41 U/L   ALT 17 0 - 44 U/L   Alkaline Phosphatase 76 38 - 126 U/L   Total Bilirubin 0.4 0.3 - 1.2 mg/dL   GFR calc non Af Amer >60 >60 mL/min   GFR calc Af Amer >60 >60 mL/min   Anion  gap 8 5 - 15    Comment: Performed at [facility], [place] [address], [place], [place] [address]  CBC with Differential     Status: Abnormal   Collection Time: 05/09/20  3:32 PM  Result Value Ref Range   WBC 4.0 4.0 - 10.5 K/uL   RBC 4.04 3.87 - 5.11 MIL/uL   Hemoglobin 12.1 12.0 - 15.0 g/dL   HCT 38.7 36.0 - 46.0 %   MCV 95.8 80.0 - 100.0 fL   MCH 30.0 26.0 - 34.0 pg   MCHC 31.3 30.0 - 36.0 g/dL   RDW 16.0 (H) 11.5 - 15.5 %   Platelets 189 150 - 400 K/uL   nRBC 0.0 0.0 - 0.2 %   Neutrophils Relative % 36 %   Neutro Abs 1.4 (L) 1.7 - 7.7 K/uL   Lymphocytes Relative 36 %   Lymphs Abs 1.5 0.7 - 4.0 K/uL   Monocytes Relative 13 %   Monocytes Absolute 0.5 0.1 - 1.0 K/uL   Eosinophils Relative 14 %   Eosinophils Absolute 0.5 0.0 - 0.5 K/uL   Basophils  Relative 1 %   Basophils Absolute 0.0 0.0 - 0.1 K/uL   Immature Granulocytes 0 %   Abs Immature Granulocytes 0.01 0.00 - 0.07 K/uL    Comment: Performed at [facility] [address] [place], [place] [address]    MICRO:  IMAGING: MR Brain W and Wo Contrast  Result Date: 05/09/2020 CLINICAL DATA:  Intracranial toxoplasmosis.  HIV EXAM: MRI HEAD WITHOUT AND WITH CONTRAST TECHNIQUE: Multiplanar, multiecho pulse sequences of the brain and surrounding structures were obtained without and with intravenous contrast. CONTRAST:  67mL GADAVIST GADOBUTROL 1 MMOL/ML IV SOLN COMPARISON:  MRI head 02/20/2020, 02/04/2020 FINDINGS: Brain: Enhancing mass lesion in the right temporal lobe shows interval enlargement from the most recent study and is larger compared with the study of 02/04/2020 and also 10/28/2019. The mass now measures approximately 39 x 23 x 21 mm. There is central nonenhancing necrosis within the mass. No restricted diffusion in the mass. There is significantly increased vasogenic edema in the right temporal lobe with increased mass-effect. There is now 6 mm midline shift to the left. No shift on the prior study. There is compression of the right lateral ventricle. No second enhancing lesion identified. No associated hemorrhage or infarction. Vascular: Normal arterial flow voids. Skull and upper cervical spine: No focal skeletal lesion Sinuses/Orbits: Mild mucosal edema paranasal sinuses.  Normal orbit Other: None IMPRESSION: Interval enlargement of enhancing mass lesion right temporal lobe with central necrosis. Considerable progression of surrounding vasogenic edema and mass-effect now with 6 mm midline shift. No second lesion identified. Electronically Signed   By: Franchot Gallo M.D.   On: 05/09/2020 14:09    HISTORICAL MICRO/IMAGING  Assessment/Plan:  Brain mass, ddx toxo vs mtb vs. Lymphoma. In patient with advanced hiv disease  - continue with hiv regimen - will check cd 4 count  and HIV VL  - await for results to see that if she has undetectable VL then concern that this is not CNS toxo and will need tissue biopsy for path and culture - will start induction therapy for toxo, but patient had finished this roughly 2 months ago and has been on maintenance therapy  More recs to follow

## 2020-05-09 NOTE — ED Notes (Signed)
PT left  For MRI

## 2020-05-09 NOTE — ED Triage Notes (Signed)
Pt c/o 9/10 HA for about a week now, was seen by Neurologist this last Monday. Pt not having any relief with medication.

## 2020-05-09 NOTE — ED Notes (Signed)
IV team at bedside 

## 2020-05-09 NOTE — Progress Notes (Signed)
IV team placing midline. RN will call when pt is ready for MRI.

## 2020-05-09 NOTE — ED Provider Notes (Signed)
  Physical Exam  BP 127/85   Pulse 72   Temp 98.9 F (37.2 C) (Oral)   Resp 19   Ht 5\' 5"  (1.651 m)   Wt 84.5 kg   SpO2 100%   BMI 31.00 kg/m   Physical Exam  ED Course/Procedures   Clinical Course as of May 09 1838  Wed May 09, 2020  1107 I discussed case with Dr. Sandi Mariscal, radiologist who recommends imaging for toxoplasmosis with MRI brain with and without contrast.   [EW]  1451 Case discussed with infectious disease, Dr. Baxter Flattery who will see the patient in the ED for management and disposition.   [EW]    Clinical Course User Index [EW] Daleen Bo, MD    Procedures  MDM  Patient with brain mass.  Toxoplasmosis versus other.  HIV disease.  Seen by Dr. Graylon Good who appears to be doing consult.  Will discuss with family practice about admission      Davonna Belling, MD 05/09/20 1840

## 2020-05-09 NOTE — ED Notes (Signed)
CT called for update

## 2020-05-09 NOTE — ED Notes (Signed)
Patient transported to MRI at this time. 

## 2020-05-10 ENCOUNTER — Encounter (HOSPITAL_COMMUNITY): Payer: Self-pay | Admitting: Student in an Organized Health Care Education/Training Program

## 2020-05-10 ENCOUNTER — Other Ambulatory Visit: Payer: Self-pay

## 2020-05-10 LAB — CBC
HCT: 35.6 % — ABNORMAL LOW (ref 36.0–46.0)
Hemoglobin: 11.6 g/dL — ABNORMAL LOW (ref 12.0–15.0)
MCH: 29.8 pg (ref 26.0–34.0)
MCHC: 32.6 g/dL (ref 30.0–36.0)
MCV: 91.5 fL (ref 80.0–100.0)
Platelets: 191 10*3/uL (ref 150–400)
RBC: 3.89 MIL/uL (ref 3.87–5.11)
RDW: 15.5 % (ref 11.5–15.5)
WBC: 5.3 10*3/uL (ref 4.0–10.5)
nRBC: 0 % (ref 0.0–0.2)

## 2020-05-10 LAB — BASIC METABOLIC PANEL
Anion gap: 10 (ref 5–15)
BUN: <5 mg/dL — ABNORMAL LOW (ref 6–20)
CO2: 18 mmol/L — ABNORMAL LOW (ref 22–32)
Calcium: 8.5 mg/dL — ABNORMAL LOW (ref 8.9–10.3)
Chloride: 107 mmol/L (ref 98–111)
Creatinine, Ser: 0.66 mg/dL (ref 0.44–1.00)
GFR calc Af Amer: >60 mL/min (ref >60)
GFR calc non Af Amer: >60 mL/min (ref >60)
Glucose, Bld: 85 mg/dL (ref 70–99)
Potassium: 3.8 mmol/L (ref 3.5–5.1)
Sodium: 135 mmol/L (ref 135–145)

## 2020-05-10 LAB — T-HELPER CELLS (CD4) COUNT (NOT AT ARMC)
CD4 % Helper T Cell: 6 % — ABNORMAL LOW (ref 33–65)
CD4 T Cell Abs: 107 /uL — ABNORMAL LOW (ref 400–1790)

## 2020-05-10 LAB — PREGNANCY, URINE: Preg Test, Ur: NEGATIVE

## 2020-05-10 MED ORDER — ONDANSETRON HCL 4 MG/2ML IJ SOLN
4.0000 mg | Freq: Once | INTRAMUSCULAR | Status: AC
  Administered 2020-05-10: 4 mg via INTRAVENOUS
  Filled 2020-05-10: qty 2

## 2020-05-10 NOTE — Consult Note (Signed)
Reason for Consult: Brain lesion Referring Physician: Dr. Miguel Rota Rodriguez Kelsey Rodriguez is an 29 y.o. female.  HPI: The patient is a 29 year old black female immigrant with HIV who was diagnosed with a brain lesion in November 2020.  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  The patient has a series of MRIs which shows her brain lesion has slowly enlarged.  She was admitted yesterday with complaints of increasing headache.  A follow-up brain MRI demonstrated her right temporal lesion with increasing edema and mass-effect.  She was seen by Dr. Baxter Kelsey Rodriguez, infectious disease, who suggested a neurosurgical consultation for a brain biopsy.  Presently the patient is alert and pleasant.  She admits to a headache.  She has had no seizures, numbness, tingling or weakness.  She did have an episode of nausea and vomiting yesterday.  Past Medical History:  Diagnosis Date  . 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 Adrenal insufficiency ([place])   . Anemia of chronic disease 03/11/2012  . Back pain of lumbar region with sciatica 02/12/2015  . Bell's palsy 08/26/2013  . 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 Chronic back pain   . Chronic leg pain    bilateral knees, ankles  . Fatigue   . 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 HIV (human immunodeficiency virus infection) ([place]) 02/2012  . Laceration of ankle, right 11/18/2012  . Lumbar radiculopathy   . Meningitis 02/18/2018  . Pelvic pain   . PID (acute pelvic inflammatory disease) 02/26/2018  . Reflux esophagitis 03/11/2012  . Tuberculosis   . 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 Vertigo     Past Surgical History:  Procedure Laterality Date  . APPENDECTOMY  ~ 2000  . DILATION AND CURETTAGE OF UTERUS  2008  . ESOPHAGOGASTRODUODENOSCOPY  03/11/2012   Procedure: ESOPHAGOGASTRODUODENOSCOPY (EGD);  Surgeon: Lafayette Dragon, MD;  Location: [facility];  Service: Endoscopy;  Laterality: N/A;  . ESOPHAGOGASTRODUODENOSCOPY  N/A 03/07/2014   Procedure: ESOPHAGOGASTRODUODENOSCOPY (EGD);  Surgeon: Kelsey Mayer, MD;  Location: [facility] ENDOSCOPY;  Service: Endoscopy;  Laterality: N/A;  . LUNG BIOPSY  02/2012  . TOTAL HIP ARTHROPLASTY Left 12/14/2015   Procedure: LEFT TOTAL HIP ARTHROPLASTY ANTERIOR APPROACH;  Surgeon: Mcarthur Rossetti, MD;  Location: [facility] ORS;  Service: Orthopedics;  Laterality: Left;  . TOTAL HIP ARTHROPLASTY Right 04/04/2016   Procedure: RIGHT TOTAL HIP ARTHROPLASTY ANTERIOR APPROACH;  Surgeon: Mcarthur Rossetti, MD;  Location: [facility] ORS;  Service: Orthopedics;  Laterality: Right;    Family History  Problem Relation Age of Onset  . Heart disease Father        Vague not clearly cardiac    Social History:  reports that she has never smoked. She has never used smokeless tobacco. She reports that she does not drink alcohol or use drugs.  Allergies:  Allergies  Allergen Reactions  . Hydrocodone Itching and Nausea Only    Tolerates Oxycodone  . Tramadol Itching and Nausea Only    Tolerates oxycodone    Medications:  I have reviewed the patient's current medications. Prior to Admission:  Medications Prior to Admission  Medication Sig Dispense Refill Last Dose  . amitriptyline (ELAVIL) 10 MG tablet Take 1 tablet PO at night for 1 week then increase to 2 tablets thereafter (Patient taking differently: Take 10 mg by mouth. Take 1 tablet PO at night for 1 week then increase to 2 tablets thereafter) 60 tablet 3 05/08/2020 at Unknown time  . Darunavir-Cobicisctat-Emtricitabine-Tenofovir Alafenamide (  SYMTUZA) 800-150-200-10 MG TABS Take 1 tablet by mouth daily with breakfast. 30 tablet 11 Past Week at Unknown time  . dolutegravir (TIVICAY) 50 MG tablet Take 1 tablet (50 mg total) by mouth daily. 30 tablet 11 Past Week at Unknown time  . levETIRAcetam (KEPPRA) 500 MG tablet Take 1 tablet (500 mg total) by mouth 2 (two) times daily. 60 tablet 3 05/08/2020 at Unknown time  . naproxen (NAPROSYN) 500 MG  tablet Take 1 tablet (500 mg total) by mouth 2 (two) times daily with a meal. 14 tablet 0 Past Week at Unknown time  . pantoprazole (PROTONIX) 40 MG tablet Take 1 tablet (40 mg total) by mouth daily. 30 tablet 0   . sulfamethoxazole-trimethoprim (BACTRIM DS) 800-160 MG tablet Take 1 tablet by mouth 2 (two) times daily. (Patient not taking: Reported on 05/09/2020) 60 tablet 3 Completed Course at Unknown time   Scheduled: . acetaminophen  1,000 mg Oral Q6H  . amitriptyline  10 mg Oral QHS  . Darunavir-Cobicisctat-Emtricitabine-Tenofovir Alafenamide  1 tablet Oral Q breakfast  . dolutegravir  50 mg Oral Daily  . enoxaparin (LOVENOX) injection  40 mg Subcutaneous Q24H  . ibuprofen  400 mg Oral Q8H  . levETIRAcetam  500 mg Oral BID  . pantoprazole  40 mg Oral Daily  . polyethylene glycol  17 g Oral Daily  . senna  1 tablet Oral BID  . sodium chloride flush  10-40 mL Intracatheter Q12H   Continuous: . sodium chloride 125 mL/hr at 05/10/20 1637  . sulfamethoxazole-trimethoprim 422.56 mg (05/10/20 1639)   [id number], sodium chloride flush Anti-infectives (From admission, onward)   Start     Dose/Rate Route Frequency Ordered Stop   05/10/20 0800  Darunavir-Cobicisctat-Emtricitabine-Tenofovir Alafenamide (SYMTUZA) 800-150-200-10 MG TABS 1 tablet     1 tablet Oral Daily with breakfast 05/09/20 2050     05/09/20 2230  dolutegravir (TIVICAY) tablet 50 mg     50 mg Oral Daily 05/09/20 2050     05/09/20 1800  sulfamethoxazole-trimethoprim (BACTRIM) 422.56 mg in dextrose 5 % 500 mL IVPB     10 mg/kg/day  84.5 kg 350.9 mL/hr over 90 Minutes Intravenous Every 12 hours 05/09/20 1558         Results for orders placed or performed during the hospital encounter of 05/09/20 (from the past 48 hour(s))  SARS Coronavirus 2 by RT PCR (hospital order, performed in [name] hospital lab) Nasopharyngeal Nasopharyngeal Swab     Status: None   Collection Time: 05/09/20  2:59 PM   Specimen: Nasopharyngeal  Swab  Result Value Ref Range   SARS Coronavirus 2 NEGATIVE NEGATIVE    Comment: (NOTE) SARS-CoV-2 target nucleic acids are NOT DETECTED. The SARS-CoV-2 RNA is generally detectable in upper and lower respiratory specimens during the acute phase of infection. The lowest concentration of SARS-CoV-2 viral copies this assay can detect is 250 copies / mL. A negative result does not preclude SARS-CoV-2 infection and should not be used as the sole basis for treatment or other patient management decisions.  A negative result may occur with improper specimen collection / handling, submission of specimen other than nasopharyngeal swab, presence of viral mutation(s) within the areas targeted by this assay, and inadequate number of viral copies (<250 copies / mL). A negative result must be combined with clinical observations, patient history, and epidemiological information. Fact Sheet for Patients:   StrictlyIdeas.no Fact Sheet for Healthcare Providers: BankingDealers.co.za This test is not yet approved or cleared  by the [place] FDA  and has been authorized for detection and/or diagnosis of SARS-CoV-2 by FDA under an Emergency Use Authorization (EUA).  This EUA will remain in effect (meaning this test can be used) for the duration of the COVID-19 declaration under Section 564(b)(1) of the Act, 21 U.S.C. section 360bbb-3(b)(1), unless the authorization is terminated or revoked sooner. Performed at [facility], [place] [address] [place], [place] [address]   Comprehensive metabolic panel     Status: Abnormal   Collection Time: 05/09/20  3:32 PM  Result Value Ref Range   Sodium 137 135 - 145 mmol/L   Potassium 4.3 3.5 - 5.1 mmol/L   Chloride 108 98 - 111 mmol/L   CO2 21 (L) 22 - 32 mmol/L   Glucose, Bld 84 70 - 99 mg/dL    Comment: Glucose reference range applies only to samples taken after fasting for at least 8 hours.   BUN 7 6 - 20  mg/dL   Creatinine, Ser 0.76 0.44 - 1.00 mg/dL   Calcium 8.7 (L) 8.9 - 10.3 mg/dL   Total Protein 7.4 6.5 - 8.1 g/dL   Albumin 3.7 3.5 - 5.0 g/dL   AST 22 15 - 41 U/L   ALT 17 0 - 44 U/L   Alkaline Phosphatase 76 38 - 126 U/L   Total Bilirubin 0.4 0.3 - 1.2 mg/dL   GFR calc non Af Amer >60 >60 mL/min   GFR calc Af Amer >60 >60 mL/min   Anion gap 8 5 - 15    Comment: Performed at [facility], [place] [address] [place], [place] [address]  CBC with Differential     Status: Abnormal   Collection Time: 05/09/20  3:32 PM  Result Value Ref Range   WBC 4.0 4.0 - 10.5 K/uL   RBC 4.04 3.87 - 5.11 MIL/uL   Hemoglobin 12.1 12.0 - 15.0 g/dL   HCT 38.7 36.0 - 46.0 %   MCV 95.8 80.0 - 100.0 fL   MCH 30.0 26.0 - 34.0 pg   MCHC 31.3 30.0 - 36.0 g/dL   RDW 16.0 (H) 11.5 - 15.5 %   Platelets 189 150 - 400 K/uL   nRBC 0.0 0.0 - 0.2 %   Neutrophils Relative % 36 %   Neutro Abs 1.4 (L) 1.7 - 7.7 K/uL   Lymphocytes Relative 36 %   Lymphs Abs 1.5 0.7 - 4.0 K/uL   Monocytes Relative 13 %   Monocytes Absolute 0.5 0.1 - 1.0 K/uL   Eosinophils Relative 14 %   Eosinophils Absolute 0.5 0.0 - 0.5 K/uL   Basophils Relative 1 %   Basophils Absolute 0.0 0.0 - 0.1 K/uL   Immature Granulocytes 0 %   Abs Immature Granulocytes 0.01 0.00 - 0.07 K/uL    Comment: Performed at [facility] [address], [place],  [address]  Pregnancy, urine     Status: None   Collection Time: 05/10/20  8:30 AM  Result Value Ref Range   Preg Test, Ur NEGATIVE NEGATIVE    Comment:        THE SENSITIVITY OF THIS METHODOLOGY IS >20 mIU/mL. Performed at [facility], [place] [address], [place] [address]   CBC     Status: Abnormal   Collection Time: 05/10/20  9:07 AM  Result Value Ref Range   WBC 5.3 4.0 - 10.5 K/uL   RBC 3.89 3.87 - 5.11 MIL/uL   Hemoglobin 11.6 (L) 12.0 - 15.0 g/dL  HCT 35.6 (L) 36.0 - 46.0 %   MCV 91.5 80.0 - 100.0 fL   MCH 29.8 26.0 - 34.0 pg   MCHC 32.6 30.0 - 36.0  g/dL   RDW 15.5 11.5 - 15.5 %   Platelets 191 150 - 400 K/uL   nRBC 0.0 0.0 - 0.2 %    Comment: Performed at [facility], [place] [address] [place], [place] [id number]  Basic metabolic panel     Status: Abnormal   Collection Time: 05/10/20  9:07 AM  Result Value Ref Range   Sodium 135 135 - 145 mmol/L   Potassium 3.8 3.5 - 5.1 mmol/L   Chloride 107 98 - 111 mmol/L   CO2 18 (L) 22 - 32 mmol/L   Glucose, Bld 85 70 - 99 mg/dL    Comment: Glucose reference range applies only to samples taken after fasting for at least 8 hours.   BUN <5 (L) 6 - 20 mg/dL   Creatinine, Ser 0.66 0.44 - 1.00 mg/dL   Calcium 8.5 (L) 8.9 - 10.3 mg/dL   GFR calc non Af Amer >60 >60 mL/min   GFR calc Af Amer >60 >60 mL/min   Anion gap 10 5 - 15    Comment: Performed at [facility] [address], [place], [place] [address]  T-helper cells (CD4) count (not at [facility])     Status: Abnormal   Collection Time: 05/10/20  9:07 AM  Result Value Ref Range   CD4 T Cell Abs 107 (L) 400 - 1,790 /uL   CD4 % Helper T Cell 6 (L) 33 - 65 %    Comment: Performed at [facility], [place] [address], [place], [place] [address]    MR Brain W and Wo Contrast  Result Date: 05/09/2020 CLINICAL DATA:  Intracranial toxoplasmosis.  HIV EXAM: MRI HEAD WITHOUT AND WITH CONTRAST TECHNIQUE: Multiplanar, multiecho pulse sequences of the brain and surrounding structures were obtained without and with intravenous contrast. CONTRAST:  43mL GADAVIST GADOBUTROL 1 MMOL/ML IV SOLN COMPARISON:  MRI head 02/20/2020, 02/04/2020 FINDINGS: Brain: Enhancing mass lesion in the right temporal lobe shows interval enlargement from the most recent study and is larger compared with the study of 02/04/2020 and also 10/28/2019. The mass now measures approximately 39 x 23 x 21 mm. There is central nonenhancing necrosis within the mass. No restricted diffusion in the mass. There is significantly increased vasogenic edema in the right temporal  lobe with increased mass-effect. There is now 6 mm midline shift to the left. No shift on the prior study. There is compression of the right lateral ventricle. No second enhancing lesion identified. No associated hemorrhage or infarction. Vascular: Normal arterial flow voids. Skull and upper cervical spine: No focal skeletal lesion Sinuses/Orbits: Mild mucosal edema paranasal sinuses.  Normal orbit Other: None IMPRESSION: Interval enlargement of enhancing mass lesion right temporal lobe with central necrosis. Considerable progression of surrounding vasogenic edema and mass-effect now with 6 mm midline shift. No second lesion identified. Electronically Signed   By: Franchot Gallo M.D.   On: 05/09/2020 14:09    ROS: As above Blood pressure 111/86, pulse 73, temperature 98.5 F (36.9 C), temperature source Oral, resp. rate 14, height 5\' 5"  (1.651 m), weight 85.5 kg, SpO2 100 %. Estimated body mass index is 31.37 kg/m as calculated from the following:   Height as of this encounter: 5\' 5"  (1.651 m).   Weight as of this encounter: 85.5 kg.  Physical Exam  General: An alert and pleasant 29 year old black female in no apparent distress  HEENT: Normocephalic, atraumatic, pupils equal, extraocular muscles are intact  Thorax: Symmetric  Abdomen: Soft  Extremities: Unremarkable  Neurologic exam: The patient is alert and oriented x3, Glasgow Coma Scale 15.  Cranial nerves II through XII were examined bilaterally and grossly normal.  Vision and hearing are grossly normal bilaterally.  Her motor strength is 5/5 in her bilateral deltoid, bicep, tricep, handgrip, gastrocnemius, and dorsiflexors.  Cerebellar function is intact to rapid altering movements of the upper extremities bilaterally.  Sensory function is intact to light touch sensation all tested dermatomes bilaterally.  I have reviewed the patient's brain MRI performed 05/09/2020 at [facility].  She has a right temporal enhancing lesion with  central necrosis.  There is increasing edema and mild midline shift when compared to her previous studies.  Assessment/Plan: Brain lesion: I agree with Dr. Baxter [facility] Rodriguez that I would have expected if this lesion was toxoplasmosis that it would look better after 6 months of treatment.  On the other hand if this is CNS lymphoma I would expect it her brains can have a look worse sooner without treatment.  I discussed the various options with the patient, including doing nothing, continue medical management, and a brain biopsy.  I described a stereotactic brain biopsy to the patient.  We discussed the risks including risk of anesthesia, hemorrhage, infection, seizures, nondiagnostic biopsy, etc.  I have answered all her questions.  She is going to think things over.  I will rediscuss this with her tomorrow.  I would not be able to do the biopsy until next week sometime.  Ophelia Charter 05/10/2020, 5:53 PM

## 2020-05-10 NOTE — Progress Notes (Signed)
Family Medicine Teaching Service Daily Progress Note Intern Pager: [phone]  Patient name: Kelsey Rodriguez Medical record number: [id number] Date of birth: Feb 10, 1991 Age: 29 y.o. Gender: female  Primary Care Provider: Gerlene Fee, DO Consultants: ID, Neurology Code Status: Full  Pt Overview and Major Events to Date:  05/09/2020 admitted, increased R temporal lobe mass lesion w/ edema and midline shift  Assessment and Plan: Kelsey Rodriguez is a 29 y.o. female presenting with worsening headache. PMH is significant for HIV,CNS toxoplasmosis, chronic headaches and TB.  Intractable headache 2/2 space occupying lesion suspected to be toxoplasmosis Overnight, HA improved from 10/10 pain to 5/10 pain with total of 15mg  oxycodone in 10 hours. Patient given toradol, compazine, and benadryl in ED, which did not help headache. Tinnitus today is much improved, patient notes that prior to coming to the hospital, the tinnitus was so bad (L>R) that she couldn't hear much else. The brain lesion was presumed to be toxoplasmosis due to prior positive toxo antigen, however, never able to be biopsied in the past due to concern regarding midline shift and swelling. ID is consulted and recommends treatment with bactrim, and obtaining CD4 and VL (pending). Patient has history of noncompliance as an outpatient with taking ART, missed several appointments. If VL is undetectable, ID recommends bx to confirm diagnosis. DDX includes lymphoma and TB. Patient also follows with neurology outpatient. Depending on VL, may need to consult neurosurgery as well. -Vitals per floor routine -Up with assistance -ID following, appreciate recommendations -Continue Bactrim 10mg /kg/day BID per ID  -Follow up CD4 count and HIV viral load  -Monitor headache - seizure precautions -Tylenol 1000mg  Q6H -Oxycodone 5mg  Q6PRN -Ibuprofen 400mg  TID  -Continue Amitriptyline    HIVAIDS Last HIV quant on 03/07/20: 693, 2.84. Last CD4  count on 03/07/20: 45. CD4% helper t cell: 4. Pt admits to missing several doses of ART on a weekly basis. Home meds: Tivicay 50 mg daily, Symtuza daily   -ID following, appreciate recommendations -Continue home antiretroviral therapy -Repeat CD4 count and HIV quant   Seizure disorder No recent seizures reported Home meds: Keppra 500mg  BID  -Continue Keppra   Constipation Pt reports feeling constipated chronically with lower abdominal pain. Last BM was this morning and abdominal pain improved slightly. She also has recent history of rectal bleeding and has a history of hemorrhoids.  -Senna once daily -Miralax once daily   FEN/GI: regular diet PPx: lovenox  Disposition: to med-surg pending medical work up  Subjective:  Patient resting in bed, NAEO, reports that her headache is now 5/10 instead of 10/10, tinnitus improved  Objective: Temp:  [98.8 F (37.1 C)-99.2 F (37.3 C)] 98.8 F (37.1 C) (05/27 0429) Pulse Rate:  [69-83] 77 (05/27 0429) Resp:  [15-19] 15 (05/27 0429) BP: (94-133)/(68-99) 109/79 (05/27 0429) SpO2:  [99 %-100 %] 100 % (05/27 0429) Weight:  [85.5 kg] 85.5 kg (05/26 2230) Physical Exam: General: 29 yo African woman resting comfortably in bed, NAD Cardiovascular: RRR, no m/r/g Respiratory: CTAB, no increased WOB Abdomen: soft, NT, ND Extremities: warm, dry, no edema Neuro: Cranial Nerves: II: Visual Fields are full. PERRL.  III,IV, VI: EOMI without ptosis or diplopia.  V: Facial sensation is symmetric to touch VII: Facial movement is symmetric.  VIII: hearing is intact to voice X: Palate elevates symmetrically XI: Shoulder shrug is symmetric. XII: tongue is midline without atrophy or fasciculations.  Motor: Tone is normal. Bulk is normal. 5/5 strength was present in all four extremities.  Sensory: Sensation is  symmetric to light touch in the arms and legs. Deep Tendon Reflexes: 2+ and symmetric in the biceps and patellae.   Laboratory: Recent  Labs  Lab 05/09/20 1532  WBC 4.0  HGB 12.1  HCT 38.7  PLT 189   Recent Labs  Lab 05/09/20 1532  NA 137  K 4.3  CL 108  CO2 21*  BUN 7  CREATININE 0.76  CALCIUM 8.7*  PROT 7.4  BILITOT 0.4  ALKPHOS 76  ALT 17  AST 22  GLUCOSE 84   Imaging/Diagnostic Tests: MR Brain W and Wo Contrast  Result Date: 05/09/2020 CLINICAL DATA:  Intracranial toxoplasmosis.  HIV EXAM: MRI HEAD WITHOUT AND WITH CONTRAST TECHNIQUE: Multiplanar, multiecho pulse sequences of the brain and surrounding structures were obtained without and with intravenous contrast. CONTRAST:  63mL GADAVIST GADOBUTROL 1 MMOL/ML IV SOLN COMPARISON:  MRI head 02/20/2020, 02/04/2020 FINDINGS: Brain: Enhancing mass lesion in the right temporal lobe shows interval enlargement from the most recent study and is larger compared with the study of 02/04/2020 and also 10/28/2019. The mass now measures approximately 39 x 23 x 21 mm. There is central nonenhancing necrosis within the mass. No restricted diffusion in the mass. There is significantly increased vasogenic edema in the right temporal lobe with increased mass-effect. There is now 6 mm midline shift to the left. No shift on the prior study. There is compression of the right lateral ventricle. No second enhancing lesion identified. No associated hemorrhage or infarction. Vascular: Normal arterial flow voids. Skull and upper cervical spine: No focal skeletal lesion Sinuses/Orbits: Mild mucosal edema paranasal sinuses.  Normal orbit Other: None IMPRESSION: Interval enlargement of enhancing mass lesion right temporal lobe with central necrosis. Considerable progression of surrounding vasogenic edema and mass-effect now with 6 mm midline shift. No second lesion identified. Electronically Signed   By: Franchot Gallo M.D.   On: 05/09/2020 14:09   Gladys Damme, MD 05/10/2020, 7:39 AM PGY-1, [place] Intern pager: [phone], text pages welcome

## 2020-05-10 NOTE — Progress Notes (Addendum)
[name] for Infectious Disease    Date of Admission:  05/09/2020   Total days of antibiotics 2           ID: Kelsey Rodriguez is a 29 y.o. 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 She discloses that she misses roughly 2 days per week (takes bactrim 1 DS daily)  Labs reveal improved CD 4 count of 107 (up from 32 in march)  Medications:  . acetaminophen  1,000 mg Oral Q6H  . amitriptyline  10 mg Oral QHS  . Darunavir-Cobicisctat-Emtricitabine-Tenofovir Alafenamide  1 tablet Oral Q breakfast  . dolutegravir  50 mg Oral Daily  . enoxaparin (LOVENOX) injection  40 mg Subcutaneous Q24H  . ibuprofen  400 mg Oral Q8H  . levETIRAcetam  500 mg Oral BID  . pantoprazole  40 mg Oral Daily  . polyethylene glycol  17 g Oral Daily  . senna  1 tablet Oral BID  . sodium chloride flush  10-40 mL Intracatheter Q12H    Objective: Vital signs in last 24 hours: Temp:  [98 F (36.7 C)-99.2 F (37.3 C)] 98 F (36.7 C) (05/27 0808) Pulse Rate:  [69-83] 72 (05/27 0808) Resp:  [15-19] 16 (05/27 0808) BP: (94-133)/(68-99) 110/86 (05/27 0808) SpO2:  [99 %-100 %] 99 % (05/27 0808) Weight:  [85.5 kg] 85.5 kg (05/26 2230) Physical Exam  Constitutional:  oriented to person, place, and time. appears well-developed and well-nourished. No distress.  HENT: [place]/AT, PERRLA, no scleral icterus Mouth/Throat: Oropharynx is clear and moist. No oropharyngeal exudate.  Cardiovascular: Normal rate, regular rhythm and normal heart sounds. Exam reveals no gallop and  no friction rub.  No murmur heard.  Pulmonary/Chest: Effort normal and breath sounds normal. No respiratory distress.  has no wheezes.  Neck = supple, no nuchal rigidity Abdominal: Soft. Bowel sounds are normal.  exhibits no distension. There is no tenderness.  Lymphadenopathy: no cervical adenopathy. No axillary adenopathy Neurological: alert and oriented to person, place, and time. CN 2-12 grossly intact, motor and sensory intact, equal. Skin: Skin is warm and dry. No rash noted. No erythema.  Psychiatric: a normal mood and affect.  behavior is normal.    Lab Results Recent Labs    05/09/20 1532 05/10/20 0907  WBC 4.0 5.3  HGB 12.1 11.6*  HCT 38.7 35.6*  NA 137 135  K 4.3 3.8  CL 108 107  CO2 21* 18*  BUN 7 <5*  CREATININE 0.76 0.66   Liver Panel Recent Labs    05/09/20 1532  PROT 7.4  ALBUMIN 3.7  AST 22  ALT 17  ALKPHOS 84  BILITOT 0.4    Microbiology: reviewed Studies/Results: MR Brain W and Wo Contrast  Result Date: 05/09/2020 CLINICAL DATA:  Intracranial toxoplasmosis.  HIV EXAM: MRI HEAD WITHOUT AND WITH CONTRAST TECHNIQUE: Multiplanar, multiecho pulse sequences of the brain and surrounding structures were obtained without and with intravenous contrast. CONTRAST:  65mL GADAVIST GADOBUTROL 1 MMOL/ML IV SOLN COMPARISON:  MRI head 02/20/2020, 02/04/2020 FINDINGS: Brain: Enhancing mass lesion in the right temporal lobe shows interval enlargement from the  most recent study and is larger compared with the study of 02/04/2020 and also 10/28/2019. The mass now measures approximately 39 x 23 x 21 mm. There is central nonenhancing necrosis within the mass. No restricted diffusion in the mass. There is significantly increased vasogenic edema in the right temporal lobe with increased mass-effect. There is now 6 mm midline shift to the left. No shift on the prior study. There is compression of the right lateral ventricle. No second enhancing lesion identified. No associated  hemorrhage or infarction. Vascular: Normal arterial flow voids. Skull and upper cervical spine: No focal skeletal lesion Sinuses/Orbits: Mild mucosal edema paranasal sinuses.  Normal orbit Other: None IMPRESSION: Interval enlargement of enhancing mass lesion right temporal lobe with central necrosis. Considerable progression of surrounding vasogenic edema and mass-effect now with 6 mm midline shift. No second lesion identified. Electronically Signed   By: Franchot Gallo M.D.   On: 05/09/2020 14:09     Assessment/Plan: 29yo F with advanced HIV disease, hx of pulm Tb who has been more dilligent with taking her medications recently, completed induction treatment for CNS toxoplasmosis through early April and now on maintenance with bactrim DS daily - now has worsening CNS lesions with midline shift  - will get NSGY to evaluate patient/consult for brain biopsy for path and culture since would expect her CNS imaging to have improved since she had completed full induction treatment and had been on bactrim for maintenance. - despite some improvement with her immune system (CD 4 count of 107) and completion of induction therapy and partially taking maintenance treatment for cns toxo with bactrim. The lesion should be improved. Concern for other causes of brain lesions in HIV patients such as lymphoma. - for now continue on IV bactrim - in terms of vasogenic edema. Would like get biopsy before starting dexamethasone - continue on anti epileptics  hiv disease =continue on tivicay plus symtuza. Suspect improved viral load but may not be undetectable since she does miss doses  [facility] Infectious Diseases Cell: [phone] Pager: [phone]  05/10/2020, 11:04 AM

## 2020-05-10 NOTE — Plan of Care (Signed)

## 2020-05-11 ENCOUNTER — Other Ambulatory Visit: Payer: Self-pay | Admitting: Neurosurgery

## 2020-05-11 ENCOUNTER — Inpatient Hospital Stay (HOSPITAL_COMMUNITY): Payer: Medicaid Other

## 2020-05-11 LAB — BASIC METABOLIC PANEL
Anion gap: 7 (ref 5–15)
BUN: <5 mg/dL — ABNORMAL LOW (ref 6–20)
CO2: 19 mmol/L — ABNORMAL LOW (ref 22–32)
Calcium: 8.5 mg/dL — ABNORMAL LOW (ref 8.9–10.3)
Chloride: 113 mmol/L — ABNORMAL HIGH (ref 98–111)
Creatinine, Ser: 0.81 mg/dL (ref 0.44–1.00)
GFR calc Af Amer: >60 mL/min (ref >60)
GFR calc non Af Amer: >60 mL/min (ref >60)
Glucose, Bld: 90 mg/dL (ref 70–99)
Potassium: 4.3 mmol/L (ref 3.5–5.1)
Sodium: 139 mmol/L (ref 135–145)

## 2020-05-11 LAB — HIV-1 RNA QUANT-NO REFLEX-BLD
HIV 1 RNA Quant: 201000 copies/mL
LOG10 HIV-1 RNA: 5.303 log10copy/mL

## 2020-05-11 MED ORDER — GADOBUTROL 1 MMOL/ML IV SOLN
8.5000 mL | Freq: Once | INTRAVENOUS | Status: AC | PRN
  Administered 2020-05-11: 8.5 mL via INTRAVENOUS

## 2020-05-11 NOTE — Progress Notes (Signed)
Family Medicine Teaching Service Daily Progress Note Intern Pager: [phone]  Patient name: Kelsey Rodriguez Medical record number: [id number] Date of birth: 08-24-1991 Age: 29 y.o. Gender: female  Primary Care Provider: Gerlene Fee, DO Consultants: ID, Neurology Code Status: Full  Pt Overview and Major Events to Date:  05/09/2020 admitted, increased R temporal lobe mass lesion w/ edema and midline shift  Assessment and Plan: Kelsey Rodriguez is a 29 y.o. female presenting with worsening headache. PMH is significant for HIV,CNS toxoplasmosis, chronic headaches and TB.  Intractable headache 2/2 space occupying lesion suspected to be toxoplasmosis Minimal HA today, no ringing in ears. Patient only required tylenol for HA pain in the last day. The brain lesion was presumed to be toxoplasmosis due to prior positive toxo antigen, however, never able to be biopsied in the past. ID consulted and recommended consulting neurosurgery for bx as CD4 still low at 107 despite 6 months of treatment for toxoplasmosis. Patient has history of noncompliance as an outpatient with taking ART, missed several appointments. DDX includes lymphoma and TB. Neurosurgery saw patient and recommends biopsy to be done on June 3. Patient currently treated with bactrim IV, if able to be switched to PO, then she could go home for the weekend. Patient is currently unsure about whether to move forward with the biopsy or not and would like some time to think more about it. She reports all her questions were answered by the neurosurgeon. 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 Prior labs: HIV quant on 03/07/20: 693, 2.84. Last CD4 count on 03/07/20: 45. CD4% helper t cell: 4. Pt admits to  missing several doses of ART on a weekly basis. Home meds: Tivicay 50 mg daily, Symtuza daily   -ID following, appreciate recommendations -Continue home antiretroviral therapy  Seizure disorder No recent seizures reported Home meds: Keppra 500mg  BID  -Continue Keppra   Constipation Pt reports feeling constipated chronically with lower abdominal pain. Improved with daily BMs in hospital with miralax. -Senna once daily -Miralax once daily   FEN/GI: regular diet PPx: lovenox  Disposition: to med-surg pending medical work up  Subjective:  Patient resting in bed, NAEO, reports that her headache is now mild. Possibly home today or this weekend pending IV medication.  Objective: Temp:  [98 F (36.7 C)-98.8 F (37.1 C)] 98.8 F (37.1 C) (05/28 0520) Pulse Rate:  [66-82] 66 (05/28 0520) Resp:  [14-17] 17 (05/28 0520) BP: (103-111)/(62-86) 103/62 (05/28 0520) SpO2:  [99 %-100 %] 100 % (05/28 0520) Physical Exam: General: 29 yo African woman resting comfortably in bed, NAD Cardiovascular: RRR, no m/r/g Respiratory: CTAB, no increased WOB Abdomen: soft, NT, ND Extremities: warm, dry, no edema Neuro: Cranial Nerves: II: Visual Fields are full. PERRL.  III,IV, VI: EOMI without ptosis or diplopia.  V: Facial sensation is symmetric to touch VII: Facial movement is symmetric.  VIII: hearing is intact to voice X: Palate elevates symmetrically XI: Shoulder shrug is symmetric. XII: tongue is midline without atrophy or fasciculations.  Motor: Tone is normal. Bulk is normal. 5/5 strength was present in all four extremities.  Sensory: Sensation is symmetric to light touch in the arms and legs. Deep Tendon Reflexes: 2+ and symmetric in the biceps and patellae.   Laboratory: Recent Labs  Lab 05/09/20 1532 05/10/20 [id number]  WBC 4.0 5.3  HGB 12.1 11.6*  HCT 38.7 35.6*  PLT 189 191   Recent Labs  Lab 05/09/20 1532 05/10/20 0907 05/11/20 0352  NA 137 135 139  K 4.3 3.8 4.3  CL  108 107 113*  CO2 21* 18* 19*  BUN 7 <5* <5*  CREATININE 0.76 0.66 0.81  CALCIUM 8.7* 8.5* 8.5*  PROT 7.4  --   --   BILITOT 0.4  --   --   ALKPHOS 76  --   --   ALT 17  --   --   AST 22  --   --   GLUCOSE 84 85 90   Imaging/Diagnostic Tests: No results found. Gladys Damme, MD 05/11/2020, 7:40 AM PGY-1, [place] Intern pager: [phone], text pages welcome

## 2020-05-11 NOTE — Plan of Care (Signed)
  Problem: Pain Managment: Goal: General experience of comfort will improve Outcome: Progressing   

## 2020-05-11 NOTE — Progress Notes (Signed)
Subjective: The patient is alert and pleasant.  She admits to a mild headache.  Objective: Vital signs in last 24 hours: Temp:  [98 F (36.7 C)-98.8 F (37.1 C)] 98.8 F (37.1 C) (05/28 0520) Pulse Rate:  [66-82] 66 (05/28 0520) Resp:  [14-17] 17 (05/28 0520) BP: (103-111)/(62-86) 103/62 (05/28 0520) SpO2:  [99 %-100 %] 100 % (05/28 0520) Estimated body mass index is 31.37 kg/m as calculated from the following:   Height as of this encounter: 5\' 5"  (1.651 m).   Weight as of this encounter: 85.5 kg.   Intake/Output from previous day: 05/27 0701 - 05/28 0700 In: 3777.8 [I.V.:2480.6; IV Piggyback:1297.2] Out: -  Intake/Output this shift: Total I/O In: 1468.9 [I.V.:1253.8; IV Piggyback:215.1] Out: -   Physical exam the patient is alert and oriented x3.  Her speech is normal.  Her strength is normal.  Lab Results: Recent Labs    05/09/20 1532 05/10/20 0907  WBC 4.0 5.3  HGB 12.1 11.6*  HCT 38.7 35.6*  PLT 189 191   BMET Recent Labs    05/10/20 0907 05/11/20 0352  NA 135 139  K 3.8 4.3  CL 107 113*  CO2 18* 19*  GLUCOSE 85 90  BUN <5* <5*  CREATININE 0.66 0.81  CALCIUM 8.5* 8.5*    Studies/Results: MR Brain W and Wo Contrast  Result Date: 05/09/2020 CLINICAL DATA:  Intracranial toxoplasmosis.  HIV EXAM: MRI HEAD WITHOUT AND WITH CONTRAST TECHNIQUE: Multiplanar, multiecho pulse sequences of the brain and surrounding structures were obtained without and with intravenous contrast. CONTRAST:  88mL GADAVIST GADOBUTROL 1 MMOL/ML IV SOLN COMPARISON:  MRI head 02/20/2020, 02/04/2020 FINDINGS: Brain: Enhancing mass lesion in the right temporal lobe shows interval enlargement from the most recent study and is larger compared with the study of 02/04/2020 and also 10/28/2019. The mass now measures approximately 39 x 23 x 21 mm. There is central nonenhancing necrosis within the mass. No restricted diffusion in the mass. There is significantly increased vasogenic edema in the  right temporal lobe with increased mass-effect. There is now 6 mm midline shift to the left. No shift on the prior study. There is compression of the right lateral ventricle. No second enhancing lesion identified. No associated hemorrhage or infarction. Vascular: Normal arterial flow voids. Skull and upper cervical spine: No focal skeletal lesion Sinuses/Orbits: Mild mucosal edema paranasal sinuses.  Normal orbit Other: None IMPRESSION: Interval enlargement of enhancing mass lesion right temporal lobe with central necrosis. Considerable progression of surrounding vasogenic edema and mass-effect now with 6 mm midline shift. No second lesion identified. Electronically Signed   By: Franchot Gallo M.D.   On: 05/09/2020 14:09    Assessment/Plan: Right temporal brain tumor: I have again discussed the situation with the patient.  We have discussed the various treatment options.  I have recommended a stereotactic brain biopsy.  I have answered all her questions.  He seems agreeable but wants to think about it.  I can do it on 05/17/2020.  I do not think she needs to be on steroids or in the hospital until then since she is doing well clinically and this lesion has slowly enlarged over 6 months.  I will have my office contact her to make the arrangements.  Please call if I can be of further assistance.  LOS: 2 days     Ophelia Charter 05/11/2020, 6:51 AM

## 2020-05-11 NOTE — Progress Notes (Signed)
    [facility] for Infectious Disease    Date of Admission:  05/09/2020   Total days of antibiotics 3 IV bactrim          ID: Kelsey Rodriguez is a 29 y.o. 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 No vomiting or fevers  Medications:  . acetaminophen  1,000 mg Oral Q6H  . amitriptyline  10 mg Oral QHS  . Darunavir-Cobicisctat-Emtricitabine-Tenofovir Alafenamide  1 tablet Oral Q breakfast  . dolutegravir  50 mg Oral Daily  . enoxaparin (LOVENOX) injection  40 mg Subcutaneous Q24H  . ibuprofen  400 mg Oral Q8H  . levETIRAcetam  500 mg Oral BID  . pantoprazole  40 mg Oral Daily  . polyethylene glycol  17 g Oral Daily  . senna  1 tablet Oral BID  . sodium chloride flush  10-40 mL Intracatheter Q12H    Objective: Vital signs in last 24 hours: Temp:  [98.4 F (36.9 C)-98.8 F (37.1 C)] 98.7 F (37.1 C) (05/28 0757) Pulse Rate:  [66-82] 74 (05/28 0757) Resp:  [14-17] 16 (05/28 0757) BP: (103-111)/(62-86) 104/75 (05/28 0757) SpO2:  [100 %] 100 % (05/28 0757)  Physical Exam  Constitutional:  oriented to person, place, and time. appears well-developed and well-nourished. No distress.  HENT: [place]/AT, PERRLA, no scleral icterus Mouth/Throat: Oropharynx is clear and moist. No oropharyngeal exudate.  Cardiovascular: Normal rate, regular rhythm and normal heart sounds. Exam reveals no gallop and no friction rub.  No murmur heard.  Pulmonary/Chest: Effort normal and breath sounds normal. No respiratory distress.  has no wheezes.  Neck = supple, no nuchal rigidity Abdominal: Soft. Bowel sounds are normal.  exhibits no distension. There is no tenderness.  Lymphadenopathy: no cervical adenopathy. No axillary adenopathy Neurological: alert and oriented to person, place, and time.  Skin: Skin is warm and dry. No rash noted. No erythema.  Psychiatric: a normal mood and affect.  behavior is normal.     Lab Results Recent Labs    05/09/20 1532 05/09/20 1532 05/10/20 0907 05/11/20 0352  WBC 4.0  --  5.3  --   HGB 12.1  --  11.6*  --   HCT 38.7  --  35.6*  --   NA 137   < > 135 139  K 4.3   < > 3.8 4.3  CL 108   < > 107 113*  CO2 21*   < > 18* 19*  BUN 7   < > <5* <5*  CREATININE 0.76   < > 0.66 0.81   < > = values in this interval not displayed.   Liver Panel Recent Labs    05/09/20 1532  PROT 7.4  ALBUMIN 3.7  AST 22  ALT 17  ALKPHOS 76  BILITOT 0.4   Lab Results  Component Value Date   ESRSEDRATE 60 (H) 03/07/2020    Microbiology: review Studies/Results: No results found.   Assessment/Plan: Brain mass, thought to be CNS toxo = continue on IV bactrim, but patient is agreeable to getting brain biopsy by dr Arnoldo Morale on 6/3. Recommend to keep in house until biospy is completed  hiv disease= continue on biktaryv plus symtuza  Will see back on Monday. Dr Lucianne Lei dam available to questions  [facility] for Infectious Diseases Cell: [phone] Pager: [phone]  05/11/2020, 3:46 PM

## 2020-05-12 MED ORDER — IBUPROFEN 200 MG PO TABS: 400.0000 mg | ORAL_TABLET | Freq: Four times a day (QID) | ORAL | Status: DC | PRN

## 2020-05-12 NOTE — Progress Notes (Signed)
Patient wanted to leave against medical advise. Patient educated. Pt stated that she will return on Wednesday. Internal Med. MD on call notified. IV midline removed. Pt stated that husband is picking her up. Pt left at 2125 on 05/12/2020

## 2020-05-12 NOTE — Progress Notes (Signed)
Family Medicine Teaching Service Daily Progress Note Intern Pager: [phone]  Patient name: Kelsey Rodriguez Medical record number: [id number] Date of birth: [birth year] Age: 29 y.o. Gender: female  Primary Care Provider: Gerlene Fee, DO Consultants: ID, Neurosurgery Code Status: Full  Pt Overview and Major Events to Date:  05/09/2020 admitted, increased R temporal lobe mass lesion w/ edema and midline shift 05/11/2020 repeat MRI brain showed slightly reduced mass with increased surronding edema making infection more likely  Assessment and Plan: Kelsey Rodriguez is a 29 y.o. female presenting with worsening headache. PMH is significant for HIV,CNS toxoplasmosis, chronic headaches and TB.  Intractable headache 2/2 space occupying lesion suspected to be toxoplasmosis repeat MRI brain overnight showed slightly reduced mass with increased surronding edema making infection more likely. Remaining inpatient until bx 6/3 per ID. However, patient would like to go home for the weekend to see her family and return on Monday for her procedure. She complains of lightheadedness but no headache at this time.  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 She is having some mild lightheadedness but is not complaining of any headache or visual changes.  Endorses some nervousness about the biopsy procedure but seems to be calm. She would like to go home for the weekend if possible.   Objective: Temp:  [98.6 F (37 C)-98.8 F (37.1 C)] 98.6 F (37 C) (05/29 0440) Pulse Rate:  [74-81] 80 (05/29 0440) Resp:  [12-17] 14 (05/29 0440) BP: (104-115)/(74-81) 113/81 (05/29 0440) SpO2:  [95 %-100 %] 100 % (05/29 0440) Physical Exam: General: 29 yo African woman resting comfortably in bed, NAD Cardiovascular: RRR, no m/r/g Respiratory: CTAB, no increased WOB Abdomen: soft, NT, ND Extremities: warm, dry, no edema Neuro: alert and oriented  Laboratory: Recent Labs  Lab 05/09/20 1532 05/10/20 0907  WBC 4.0 5.3  HGB 12.1 11.6*  HCT 38.7 35.6*  PLT 189 191   Recent Labs  Lab 05/09/20 1532 05/10/20 0907 05/11/20 0352  NA 137 135 139  K 4.3 3.8 4.3  CL 108 107 113*  CO2 21* 18* 19*  BUN 7 <5* <5*  CREATININE 0.76 0.66 0.81  CALCIUM 8.7* 8.5* 8.5*  PROT 7.4  --   --   BILITOT 0.4  --   --   ALKPHOS 76  --   --   ALT 17  --   --   AST 22  --   --   GLUCOSE 84 85 90   Imaging/Diagnostic Tests: MR BRAIN W WO CONTRAST  Result Date: 05/11/2020 CLINICAL DATA:  Brain mass. HIV positive. Stereotactic biopsy planning. EXAM: MRI HEAD WITHOUT AND WITH CONTRAST TECHNIQUE: Multiplanar, multiecho pulse sequences of the brain and surrounding structures were obtained without and with intravenous contrast. CONTRAST:  8.45mL GADAVIST GADOBUTROL 1 MMOL/ML IV SOLN COMPARISON:  MRI head 05/09/2020 FINDINGS: Brain: Enhancing mass lesion right lateral temporal lobe appears slightly smaller. There is persistent peripheral enhancement with central  necrosis with patchy enhancement extending into the adjacent subarachnoid space. There is a large amount of vasogenic edema in the white matter in the right temporal lobe extending into the deep white matter tracks. Edema shows mild progression. 6.5 mm midline shift to the left is unchanged. The lesion does not show  restricted diffusion. Possible mild hemorrhage in the mass lesion. Mild dural thickening and enhancement on the right. No second enhancing lesion. Ventricle size normal. No acute infarct. No significant chronic ischemia. Vascular: Normal arterial flow voids. Skull and upper cervical spine: No focal skeletal lesion. Sinuses/Orbits: Mild mucosal edema paranasal sinuses. Negative orbit Other: None IMPRESSION: Rim enhancing mass in the right temporal lobe. The enhancing mass appears slightly smaller compared with 2 days ago. Extensive surrounding vasogenic edema shows mild progression. Given the HIV status, this is likely an area of infection/abscess, tumor less likely. Electronically Signed   By: Franchot Gallo M.D.   On: 05/11/2020 21:24   Richarda Osmond, DO 05/12/2020, 6:48 AM PGY-2, [place] Intern pager: [phone], text pages welcome

## 2020-05-12 NOTE — Progress Notes (Signed)
Spoke to infectious disease on call to ask about potential discharge until patient's biopsy procedure next week.  Patient continues on IV antibiotics for CNS toxo.  Per ID, it would be a benefit for patient to stay in the hospital given her history of noncompliance as well as potentially losing her spot for the procedure if she is not in the hospital.  Will discuss this with patient at bedside. If patient reports wanting to leave AMA, please notify provider immediately at [phone] as it will be very important to get her home with at least p.o. antibiotics.  Wilber Oliphant, M.D.  2:36 PM 05/12/2020

## 2020-05-12 NOTE — Progress Notes (Signed)
Pt agreed to stay on 5N until IV antx that are currently running are complete. Educated pt on 3 Bactrim DS tablets twice daily, pt understood. Pt will sign AMA papers for night RN before leaving.

## 2020-05-12 NOTE — Plan of Care (Signed)
  Problem: Pain Managment: Goal: General experience of comfort will improve Outcome: Progressing   

## 2020-05-12 NOTE — Progress Notes (Signed)
Received phone call from RN that patient wanted to go home.  Went to bedside to discuss with patient.  Patient reports that from neurosurgical point of view, patient is stable to go home prior to procedure.  Patient has been scheduled for 05/16/2020 at 8:15 AM.  Patient was just on the phone with the scheduler prior to me walking into the room.  Patient is agreeable to coming back a day early, but just wants to go home and be with her family over the weekend.  Additionally, her family is not allowed to come into the hospital.  Explained to patient that she will likely not be directly admitted back in the hospital and will just come back for the procedure.  She will likely need to come several hours prior to 815 for preop.  Patient is understanding and reports that she will certainly be here.  She does admit that she was nervous about the procedure but was reassured by the neurosurgeon.  I explained to patient that if she were to leave at this time, she would have to leave against the medical advice of gastroenterology.  She understands.  When I spoke to the neurosurgeon on call about her AMA status, he reports that her procedure will not be canceled on Wednesday if she does leave AMA. Neurosurgery thought that she was going to be discharged this weekend and all plans to follow-up for procedure on Wednesday are already in motion. Patient reports that she has a lot of Bactrim at home. Confirmed dosing of PO bactrim for this patient through Wednesday. Patient will need to take 3 tablets of DS bactrim twice daily for equivalent dose.   Patient recently just had IV bactrim hung. RN will sign out report to night team about patient's AMA status.   Wilber Oliphant, M.D.  6:30 PM 05/12/2020

## 2020-05-12 NOTE — Progress Notes (Signed)
Consult placed to remove midline due to patient leaving. Spoke with bedside RN and informed her that any RN can remove midlines per policy.  Randol Kern, RN VAST

## 2020-05-12 NOTE — Discharge Summary (Signed)
Family Medicine Teaching Service AMA Summary   Patient name: Kelsey Rodriguez Medical record number: [id number] Date of birth: 07/01/1991 Age: 29 y.o. Gender: female Date of Admission: 05/09/2020  Date of departing AMA: 05/12/20    Admitting Physician: Richarda Osmond, DO  Primary Care Provider: Gerlene Fee, DO Consultants: ID, neurosurgery  Indication for Hospitalization: Toxo CNS   Hospitalization summary  Patient admitted to hospital for severe headaches.  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  ID and neurosurgery were consulted during her admission. Headache likely due to known brain lesion.  Neurosurgery would like to do biopsy of her brain lesion.  Per gastroenterology, patient is noncompliant with appointments and they would have prefer that patient stayed in the hospital until her procedure was done.  Explained to patient that if she would have to sign out [place] if she were to leave the hospital.  Spoke to neurosurgery who reported that she will still be scheduled for her procedure even if she leaves the hospital AMA.  Patient understanding that she will have to sign AMA paperwork. Patient was also advised to take p.o. Bactrim until the time of her appointment.  She should take 3 Bactrim DS tablets twice daily for equivalent bactrim treatment dosing for CNS toxo.   Wilber Oliphant, MD 05/12/2020, 6:31 PM PGY-2, [place]

## 2020-05-15 ENCOUNTER — Other Ambulatory Visit: Payer: Self-pay

## 2020-05-15 ENCOUNTER — Encounter (HOSPITAL_COMMUNITY): Payer: Self-pay | Admitting: Neurosurgery

## 2020-05-15 ENCOUNTER — Other Ambulatory Visit (HOSPITAL_COMMUNITY)
Admission: RE | Admit: 2020-05-15 | Discharge: 2020-05-15 | Disposition: A | Discharge status: Home / Self Care | Payer: Medicaid Other | Source: Ambulatory Visit | Attending: Neurosurgery | Admitting: Neurosurgery

## 2020-05-15 ENCOUNTER — Other Ambulatory Visit: Payer: Self-pay | Admitting: Neurosurgery

## 2020-05-15 LAB — SARS CORONAVIRUS 2 (TAT 6-24 HRS): SARS Coronavirus 2: NEGATIVE

## 2020-05-15 NOTE — Anesthesia Preprocedure Evaluation (Addendum)
Anesthesia Evaluation  Patient identified by MRN, date of birth, ID band Patient awake    Reviewed: Allergy & Precautions, H&P , NPO status , Patient's Chart, lab work & pertinent test results  Airway Mallampati: II   Neck ROM: full    Dental   Pulmonary neg pulmonary ROS,    breath sounds clear to auscultation       Cardiovascular hypertension,  Rhythm:regular Rate:Normal     Neuro/Psych  Headaches, Seizures -,  PSYCHIATRIC DISORDERS Depression Brain lesion  Neuromuscular disease    GI/Hepatic GERD  ,  Endo/Other    Renal/GU      Musculoskeletal   Abdominal   Peds  Hematology  (+) HIV,   Anesthesia Other Findings   Reproductive/Obstetrics                             Anesthesia Physical Anesthesia Plan  ASA: II  Anesthesia Plan: General   Post-op Pain Management:    Induction: Intravenous  PONV Risk Score and Plan: 3 and Ondansetron, Dexamethasone, Midazolam and Treatment may vary due to age or medical condition  Airway Management Planned: Oral ETT  Additional Equipment: Arterial line  Intra-op Plan:   Post-operative Plan: Extubation in OR  Informed Consent: I have reviewed the patients History and Physical, chart, labs and discussed the procedure including the risks, benefits and alternatives for the proposed anesthesia with the patient or authorized representative who has indicated his/her understanding and acceptance.       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 Neurosurgery recommended biopsy. Pt has history of HIV, followed by ID, noted to have inconsistent followup and frequently missed doses of ART meds. ID recommended pt remain inpatient for  biopsy, however pt elected to sign out AMA in regard to this. Neurosurgery aware of tis decision and stated it would not alter their plans for biopsy.   Hx of seizures, maintained on keppra.   Hx of reflux esophagitis, reports retrosternal burning after meals.   Recent labs reviewed, unremarkable.   EKG 02/13/20: Sinus rhythm. Rate 95. Probable left atrial enlargement. RSR' in V1 or V2, probably normal variant  TTE 02/23/20: 1. Left ventricular ejection fraction, by estimation, is 60 to 65%. The  left ventricle has normal function. The left ventricle has no regional  wall motion abnormalities. There is mild left ventricular hypertrophy.  Left ventricular diastolic parameters  were normal.  2. Right ventricular systolic function is normal. The right ventricular  size is normal.  3. Left atrial size was mildly dilated.  4. The mitral valve is normal in structure. No evidence of mitral valve  regurgitation. No evidence of mitral stenosis.  5. The aortic valve is normal in structure. Aortic valve regurgitation is  not visualized. No aortic stenosis is present.  6. The inferior vena cava is normal in size with greater than 50%  respiratory variability, suggesting right atrial pressure of 3 mmHg.  )       Anesthesia Quick Evaluation

## 2020-05-15 NOTE — Progress Notes (Signed)
Anesthesia Chart Review: Same day workup  Recent admission 5/26-5/29 for intractable headaches secondary to space occupying brian lesion thought to be related to known diagnosis of CNS toxo. Neurosurgery recommended biopsy. Pt has history of HIV, followed by ID, noted to have inconsistent followup and frequently missed doses of ART meds. ID recommended pt remain inpatient for biopsy, however pt elected to sign out AMA in regard to this. Neurosurgery aware of tis decision and stated it would not alter their plans for biopsy.   Hx of seizures, maintained on keppra.   Hx of reflux esophagitis, reports retrosternal burning after meals.   Recent labs reviewed, unremarkable.   EKG 02/13/20: Sinus rhythm. Rate 95. Probable left atrial enlargement. RSR' in V1 or V2, probably normal variant  TTE 02/23/20: 1. Left ventricular ejection fraction, by estimation, is 60 to 65%. The  left ventricle has normal function. The left ventricle has no regional  wall motion abnormalities. There is mild left ventricular hypertrophy.  Left ventricular diastolic parameters  were normal.  2. Right ventricular systolic function is normal. The right ventricular  size is normal.  3. Left atrial size was mildly dilated.  4. The mitral valve is normal in structure. No evidence of mitral valve  regurgitation. No evidence of mitral stenosis.  5. The aortic valve is normal in structure. Aortic valve regurgitation is  not visualized. No aortic stenosis is present.  6. The inferior vena cava is normal in size with greater than 50%  respiratory variability, suggesting right atrial pressure of 3 mmHg.     Wynonia Musty [facility] Short Stay Center/Anesthesiology Phone [phone] 05/15/2020 1:52 PM

## 2020-05-15 NOTE — Progress Notes (Signed)
Pt denies SOB and beng under the care of a cardiologist. Pt stated that she gets chest pain when she eats " it is not new. " Pt stated that PCP is Dr. Naaman Plummer Autry[place]Lott. Pt denies having a stress test and cardiac cath. Pt stated that she does not take Aspirin. Pt made aware to stop taking vitamins, fish oil and herbal medications. Do not take any NSAIDs ie: Ibuprofen, Advil, Naproxen (Naprosyn/Aleve), Motrin, BC and Goody Powder. Pt reminded to quarantine. Pt verbalized understanding of all pre-op instructions. PA, Anesthesiology, asked to review pt history.

## 2020-05-16 ENCOUNTER — Inpatient Hospital Stay (HOSPITAL_COMMUNITY): Payer: Medicaid Other | Admitting: Certified Registered"

## 2020-05-16 ENCOUNTER — Encounter (HOSPITAL_COMMUNITY): Payer: Self-pay | Admitting: Neurosurgery

## 2020-05-16 ENCOUNTER — Inpatient Hospital Stay (HOSPITAL_COMMUNITY)
Admission: RE | Admit: 2020-05-16 | Discharge: 2020-05-17 | DRG: 027 | Disposition: A | Discharge status: Home / Self Care | Payer: Medicaid Other | Attending: Neurosurgery | Admitting: Neurosurgery

## 2020-05-16 ENCOUNTER — Encounter (HOSPITAL_COMMUNITY)
Admission: RE | Disposition: A | Discharge status: Home / Self Care | Payer: Self-pay | Source: Home / Self Care | Attending: Neurosurgery

## 2020-05-16 DIAGNOSIS — B20 Human immunodeficiency virus [HIV] disease: Secondary | ICD-10-CM | POA: Diagnosis not present

## 2020-05-16 DIAGNOSIS — D330 Benign neoplasm of brain, supratentorial: Secondary | ICD-10-CM | POA: Diagnosis not present

## 2020-05-16 DIAGNOSIS — Z79899 Other long term (current) drug therapy: Secondary | ICD-10-CM | POA: Diagnosis not present

## 2020-05-16 DIAGNOSIS — Z9889 Other specified postprocedural states: Secondary | ICD-10-CM

## 2020-05-16 DIAGNOSIS — Z96643 Presence of artificial hip joint, bilateral: Secondary | ICD-10-CM | POA: Diagnosis not present

## 2020-05-16 DIAGNOSIS — Z21 Asymptomatic human immunodeficiency virus [HIV] infection status: Secondary | ICD-10-CM | POA: Diagnosis not present

## 2020-05-16 DIAGNOSIS — D496 Neoplasm of unspecified behavior of brain: Secondary | ICD-10-CM | POA: Diagnosis present

## 2020-05-16 DIAGNOSIS — R569 Unspecified convulsions: Secondary | ICD-10-CM | POA: Diagnosis present

## 2020-05-16 DIAGNOSIS — Z20822 Contact with and (suspected) exposure to covid-19: Secondary | ICD-10-CM | POA: Diagnosis not present

## 2020-05-16 DIAGNOSIS — Z885 Allergy status to narcotic agent status: Secondary | ICD-10-CM

## 2020-05-16 DIAGNOSIS — K2100 Gastro-esophageal reflux disease with esophagitis, without bleeding: Secondary | ICD-10-CM | POA: Diagnosis present

## 2020-05-16 DIAGNOSIS — K219 Gastro-esophageal reflux disease without esophagitis: Secondary | ICD-10-CM | POA: Diagnosis present

## 2020-05-16 DIAGNOSIS — I10 Essential (primary) hypertension: Secondary | ICD-10-CM | POA: Diagnosis not present

## 2020-05-16 HISTORY — DX: Unspecified convulsions: R56.9

## 2020-05-16 HISTORY — DX: Other specified postprocedural states: Z98.890

## 2020-05-16 HISTORY — DX: Headache, unspecified: R51.9

## 2020-05-16 HISTORY — PX: CRANIOTOMY: SHX93

## 2020-05-16 HISTORY — DX: Gastro-esophageal reflux disease without esophagitis: K21.9

## 2020-05-16 HISTORY — DX: Presence of spectacles and contact lenses: Z97.3

## 2020-05-16 HISTORY — PX: APPLICATION OF CRANIAL NAVIGATION: SHX6578

## 2020-05-16 HISTORY — DX: Depression, unspecified: F32.A

## 2020-05-16 HISTORY — DX: Neoplasm of unspecified behavior of brain: D49.6

## 2020-05-16 HISTORY — DX: Pneumonia, unspecified organism: J18.9

## 2020-05-16 HISTORY — DX: Disorder of brain, unspecified: G93.9

## 2020-05-16 LAB — MRSA PCR SCREENING: MRSA by PCR: NEGATIVE

## 2020-05-16 LAB — POCT PREGNANCY, URINE: Preg Test, Ur: NEGATIVE

## 2020-05-16 SURGERY — CRANIOTOMY TUMOR EXCISION
Anesthesia: General | Site: Head | Laterality: Right

## 2020-05-16 MED ORDER — MORPHINE SULFATE (PF) 2 MG/ML IV SOLN
1.0000 mg | INTRAVENOUS | Status: DC | PRN
  Administered 2020-05-16 (×4): 2 mg via INTRAVENOUS
  Filled 2020-05-16 (×4): qty 1

## 2020-05-16 MED ORDER — ORAL CARE MOUTH RINSE: 15.0000 mL | Freq: Once | OROMUCOSAL | Status: AC

## 2020-05-16 MED ORDER — 0.9 % SODIUM CHLORIDE (POUR BTL) OPTIME
TOPICAL | Status: DC | PRN
  Administered 2020-05-16 (×2): 1000 mL

## 2020-05-16 MED ORDER — ONDANSETRON HCL 4 MG PO TABS: 4.0000 mg | ORAL_TABLET | ORAL | Status: DC | PRN

## 2020-05-16 MED ORDER — LACTATED RINGERS IV SOLN
INTRAVENOUS | Status: DC | PRN
Start: 2020-05-16 — End: 2020-05-16

## 2020-05-16 MED ORDER — PROPOFOL 10 MG/ML IV BOLUS
INTRAVENOUS | Status: DC | PRN
  Administered 2020-05-16: 130 mg via INTRAVENOUS

## 2020-05-16 MED ORDER — CHLORHEXIDINE GLUCONATE CLOTH 2 % EX PADS
6.0000 | MEDICATED_PAD | Freq: Every day | CUTANEOUS | Status: DC
Start: 2020-05-16 — End: 2020-05-16

## 2020-05-16 MED ORDER — THROMBIN 20000 UNITS EX SOLR
CUTANEOUS | Status: DC | PRN
  Administered 2020-05-16: 20 mL via TOPICAL

## 2020-05-16 MED ORDER — SODIUM CHLORIDE 0.9 % IV SOLN
INTRAVENOUS | Status: DC | PRN
  Administered 2020-05-16: 500 mg via INTRAVENOUS

## 2020-05-16 MED ORDER — CHLORHEXIDINE GLUCONATE CLOTH 2 % EX PADS
6.0000 | MEDICATED_PAD | Freq: Every day | CUTANEOUS | Status: DC
  Administered 2020-05-17: 6 via TOPICAL

## 2020-05-16 MED ORDER — DOLUTEGRAVIR SODIUM 50 MG PO TABS
50.0000 mg | ORAL_TABLET | Freq: Every day | ORAL | Status: DC
  Administered 2020-05-17: 50 mg via ORAL
  Filled 2020-05-16: qty 1

## 2020-05-16 MED ORDER — BACITRACIN ZINC 500 UNIT/GM EX OINT
TOPICAL_OINTMENT | CUTANEOUS | Status: AC
  Filled 2020-05-16: qty 28.35

## 2020-05-16 MED ORDER — THROMBIN 20000 UNITS EX SOLR
CUTANEOUS | Status: AC
  Filled 2020-05-16: qty 20000

## 2020-05-16 MED ORDER — LIDOCAINE 2% (20 MG/ML) 5 ML SYRINGE
INTRAMUSCULAR | Status: DC | PRN
  Administered 2020-05-16: 80 mg via INTRAVENOUS

## 2020-05-16 MED ORDER — LIDOCAINE-EPINEPHRINE 1 %-1:100000 IJ SOLN
INTRAMUSCULAR | Status: AC
  Filled 2020-05-16: qty 1

## 2020-05-16 MED ORDER — HYDROMORPHONE HCL 2 MG PO TABS
4.0000 mg | ORAL_TABLET | ORAL | Status: DC | PRN
  Administered 2020-05-16 (×2): 4 mg via ORAL
  Filled 2020-05-16 (×2): qty 2

## 2020-05-16 MED ORDER — SUGAMMADEX SODIUM 200 MG/2ML IV SOLN
INTRAVENOUS | Status: DC | PRN
  Administered 2020-05-16: 200 mg via INTRAVENOUS

## 2020-05-16 MED ORDER — BACITRACIN ZINC 500 UNIT/GM EX OINT
TOPICAL_OINTMENT | CUTANEOUS | Status: DC | PRN
  Administered 2020-05-16: 1 via TOPICAL

## 2020-05-16 MED ORDER — CEFAZOLIN SODIUM-DEXTROSE 2-4 GM/100ML-% IV SOLN
2.0000 g | INTRAVENOUS | Status: AC
  Administered 2020-05-16: 2 g via INTRAVENOUS
  Filled 2020-05-16: qty 100

## 2020-05-16 MED ORDER — LEVETIRACETAM IN NACL 500 MG/100ML IV SOLN
500.0000 mg | Freq: Two times a day (BID) | INTRAVENOUS | Status: DC
  Administered 2020-05-16 – 2020-05-17 (×2): 500 mg via INTRAVENOUS
  Filled 2020-05-16 (×2): qty 100

## 2020-05-16 MED ORDER — SULFAMETHOXAZOLE-TRIMETHOPRIM 800-160 MG PO TABS
1.0000 | ORAL_TABLET | Freq: Two times a day (BID) | ORAL | Status: DC
  Administered 2020-05-16 – 2020-05-17 (×2): 1 via ORAL
  Filled 2020-05-16 (×3): qty 1

## 2020-05-16 MED ORDER — ONDANSETRON HCL 4 MG/2ML IJ SOLN: 4.0000 mg | INTRAMUSCULAR | Status: DC | PRN

## 2020-05-16 MED ORDER — FENTANYL CITRATE (PF) 100 MCG/2ML IJ SOLN
INTRAMUSCULAR | Status: AC
  Filled 2020-05-16: qty 2

## 2020-05-16 MED ORDER — CHLORHEXIDINE GLUCONATE CLOTH 2 % EX PADS: 6.0000 | MEDICATED_PAD | Freq: Once | CUTANEOUS | Status: DC

## 2020-05-16 MED ORDER — PANTOPRAZOLE SODIUM 40 MG PO TBEC
40.0000 mg | DELAYED_RELEASE_TABLET | Freq: Every day | ORAL | Status: DC
  Administered 2020-05-17: 40 mg via ORAL
  Filled 2020-05-16: qty 1

## 2020-05-16 MED ORDER — EPHEDRINE 5 MG/ML INJ
INTRAVENOUS | Status: AC
  Filled 2020-05-16: qty 10

## 2020-05-16 MED ORDER — LIDOCAINE 2% (20 MG/ML) 5 ML SYRINGE
INTRAMUSCULAR | Status: AC
  Filled 2020-05-16: qty 5

## 2020-05-16 MED ORDER — OXYCODONE HCL 5 MG/5ML PO SOLN: 5.0000 mg | Freq: Once | ORAL | Status: DC | PRN

## 2020-05-16 MED ORDER — ROCURONIUM BROMIDE 10 MG/ML (PF) SYRINGE
PREFILLED_SYRINGE | INTRAVENOUS | Status: DC | PRN
  Administered 2020-05-16: 20 mg via INTRAVENOUS
  Administered 2020-05-16: 50 mg via INTRAVENOUS
  Administered 2020-05-16: 30 mg via INTRAVENOUS

## 2020-05-16 MED ORDER — LABETALOL HCL 5 MG/ML IV SOLN
10.0000 mg | INTRAVENOUS | Status: DC | PRN
  Filled 2020-05-16: qty 4

## 2020-05-16 MED ORDER — PANTOPRAZOLE SODIUM 40 MG IV SOLR: 40.0000 mg | Freq: Every day | INTRAVENOUS | Status: DC

## 2020-05-16 MED ORDER — FENTANYL CITRATE (PF) 250 MCG/5ML IJ SOLN
INTRAMUSCULAR | Status: AC
  Filled 2020-05-16: qty 5

## 2020-05-16 MED ORDER — SUCCINYLCHOLINE CHLORIDE 200 MG/10ML IV SOSY
PREFILLED_SYRINGE | INTRAVENOUS | Status: AC
  Filled 2020-05-16: qty 10

## 2020-05-16 MED ORDER — DOCUSATE SODIUM 100 MG PO CAPS
100.0000 mg | ORAL_CAPSULE | Freq: Two times a day (BID) | ORAL | Status: DC
  Administered 2020-05-16 – 2020-05-17 (×2): 100 mg via ORAL
  Filled 2020-05-16 (×2): qty 1

## 2020-05-16 MED ORDER — PHENYLEPHRINE 40 MCG/ML (10ML) SYRINGE FOR IV PUSH (FOR BLOOD PRESSURE SUPPORT)
PREFILLED_SYRINGE | INTRAVENOUS | Status: AC
  Filled 2020-05-16: qty 10

## 2020-05-16 MED ORDER — FENTANYL CITRATE (PF) 100 MCG/2ML IJ SOLN
25.0000 ug | INTRAMUSCULAR | Status: DC | PRN
  Administered 2020-05-16: 50 ug via INTRAVENOUS

## 2020-05-16 MED ORDER — DEXAMETHASONE SODIUM PHOSPHATE 10 MG/ML IJ SOLN
8.0000 mg | Freq: Four times a day (QID) | INTRAMUSCULAR | Status: DC
  Administered 2020-05-16 – 2020-05-17 (×4): 8 mg via INTRAVENOUS
  Filled 2020-05-16 (×4): qty 1

## 2020-05-16 MED ORDER — ONDANSETRON HCL 4 MG/2ML IJ SOLN: 4.0000 mg | Freq: Four times a day (QID) | INTRAMUSCULAR | Status: DC | PRN

## 2020-05-16 MED ORDER — LEVETIRACETAM 500 MG PO TABS: 500.0000 mg | ORAL_TABLET | Freq: Two times a day (BID) | ORAL | Status: DC

## 2020-05-16 MED ORDER — ONDANSETRON HCL 4 MG/2ML IJ SOLN
INTRAMUSCULAR | Status: DC | PRN
  Administered 2020-05-16: 4 mg via INTRAVENOUS

## 2020-05-16 MED ORDER — SODIUM CHLORIDE 0.9 % IV SOLN
INTRAVENOUS | Status: DC | PRN
  Administered 2020-05-16: 500 mL

## 2020-05-16 MED ORDER — DARUN-COBIC-EMTRICIT-TENOFAF 800-150-200-10 MG PO TABS
1.0000 | ORAL_TABLET | Freq: Every day | ORAL | Status: DC
  Administered 2020-05-17: 1 via ORAL
  Filled 2020-05-16: qty 1

## 2020-05-16 MED ORDER — PROMETHAZINE HCL 25 MG PO TABS: 12.5000 mg | ORAL_TABLET | ORAL | Status: DC | PRN

## 2020-05-16 MED ORDER — OXYCODONE HCL 5 MG PO TABS: 5.0000 mg | ORAL_TABLET | Freq: Once | ORAL | Status: DC | PRN

## 2020-05-16 MED ORDER — POTASSIUM CHLORIDE IN NACL 20-0.9 MEQ/L-% IV SOLN
INTRAVENOUS | Status: DC
  Filled 2020-05-16 (×2): qty 1000

## 2020-05-16 MED ORDER — THROMBIN 5000 UNITS EX SOLR
CUTANEOUS | Status: AC
  Filled 2020-05-16: qty 5000

## 2020-05-16 MED ORDER — ROCURONIUM BROMIDE 10 MG/ML (PF) SYRINGE
PREFILLED_SYRINGE | INTRAVENOUS | Status: AC
  Filled 2020-05-16: qty 10

## 2020-05-16 MED ORDER — MICROFIBRILLAR COLL HEMOSTAT EX PADS
MEDICATED_PAD | CUTANEOUS | Status: DC | PRN
  Administered 2020-05-16: 1 via TOPICAL

## 2020-05-16 MED ORDER — DEXAMETHASONE SODIUM PHOSPHATE 10 MG/ML IJ SOLN
INTRAMUSCULAR | Status: DC | PRN
  Administered 2020-05-16: 20 mg via INTRAVENOUS

## 2020-05-16 MED ORDER — SODIUM CHLORIDE 0.9 % IV SOLN
INTRAVENOUS | Status: DC | PRN
Start: 2020-05-16 — End: 2020-05-16

## 2020-05-16 MED ORDER — LIDOCAINE-EPINEPHRINE 1 %-1:100000 IJ SOLN
INTRAMUSCULAR | Status: DC | PRN
  Administered 2020-05-16: 6 mL

## 2020-05-16 MED ORDER — CHLORHEXIDINE GLUCONATE 0.12 % MT SOLN
15.0000 mL | Freq: Once | OROMUCOSAL | Status: AC
  Administered 2020-05-16: 15 mL via OROMUCOSAL
  Filled 2020-05-16: qty 15

## 2020-05-16 MED ORDER — CEFAZOLIN SODIUM-DEXTROSE 2-4 GM/100ML-% IV SOLN
2.0000 g | Freq: Three times a day (TID) | INTRAVENOUS | Status: AC
  Administered 2020-05-16 – 2020-05-17 (×2): 2 g via INTRAVENOUS
  Filled 2020-05-16 (×2): qty 100

## 2020-05-16 MED ORDER — ACETAMINOPHEN 325 MG PO TABS: 650.0000 mg | ORAL_TABLET | ORAL | Status: DC | PRN

## 2020-05-16 MED ORDER — BUPIVACAINE-EPINEPHRINE 0.5% -1:200000 IJ SOLN
INTRAMUSCULAR | Status: AC
  Filled 2020-05-16: qty 1

## 2020-05-16 MED ORDER — ONDANSETRON HCL 4 MG/2ML IJ SOLN
INTRAMUSCULAR | Status: AC
  Filled 2020-05-16: qty 2

## 2020-05-16 MED ORDER — PROPOFOL 10 MG/ML IV BOLUS
INTRAVENOUS | Status: AC
  Filled 2020-05-16: qty 20

## 2020-05-16 MED ORDER — AMITRIPTYLINE HCL 10 MG PO TABS
10.0000 mg | ORAL_TABLET | Freq: Every day | ORAL | Status: DC
  Administered 2020-05-16: 10 mg via ORAL
  Filled 2020-05-16 (×2): qty 1

## 2020-05-16 MED ORDER — FENTANYL CITRATE (PF) 250 MCG/5ML IJ SOLN
INTRAMUSCULAR | Status: DC | PRN
  Administered 2020-05-16: 50 ug via INTRAVENOUS
  Administered 2020-05-16: 100 ug via INTRAVENOUS
  Administered 2020-05-16: 50 ug via INTRAVENOUS
  Administered 2020-05-16: 100 ug via INTRAVENOUS

## 2020-05-16 MED ORDER — ACETAMINOPHEN 650 MG RE SUPP: 650.0000 mg | RECTAL | Status: DC | PRN

## 2020-05-16 MED ORDER — THROMBIN 5000 UNITS EX SOLR
OROMUCOSAL | Status: DC | PRN
  Administered 2020-05-16: 5 mL via TOPICAL

## 2020-05-16 SURGICAL SUPPLY — 64 items
BLADE CLIPPER SURG (BLADE) ×2 IMPLANT
BNDG ADH 1X3 SHEER STRL LF (GAUZE/BANDAGES/DRESSINGS) IMPLANT
BUR PRECISION FLUTE 6.0 (BURR) ×5 IMPLANT
BUR SPIRAL ROUTER 2.3 (BUR) ×1 IMPLANT
BUR SPIRAL ROUTER 2.3MM (BUR) ×1
CANISTER SUCT 3000ML PPV (MISCELLANEOUS) ×7 IMPLANT
CARTRIDGE OIL MAESTRO DRILL (MISCELLANEOUS) ×3 IMPLANT
CNTNR URN SCR LID CUP LEK RST (MISCELLANEOUS) ×3 IMPLANT
CONT SPEC 4OZ STRL OR WHT (MISCELLANEOUS) ×2
COVER BURR HOLE 14 (Orthopedic Implant) ×2 IMPLANT
COVER WAND RF STERILE (DRAPES) ×3 IMPLANT
DIFFUSER DRILL AIR PNEUMATIC (MISCELLANEOUS) ×5 IMPLANT
DRAPE NEUROLOGICAL W/INCISE (DRAPES) ×2 IMPLANT
DRAPE WARM FLUID 44X44 (DRAPES) ×2 IMPLANT
DURAPREP 26ML APPLICATOR (WOUND CARE) IMPLANT
ELECT REM PT RETURN 9FT ADLT (ELECTROSURGICAL) ×5
ELECTRODE REM PT RTRN 9FT ADLT (ELECTROSURGICAL) ×3 IMPLANT
FORCEPS BIPOLAR SPETZLER 8 1.0 (NEUROSURGERY SUPPLIES) ×4 IMPLANT
GAUZE 4X4 16PLY RFD (DISPOSABLE) IMPLANT
GAUZE SPONGE 4X4 12PLY STRL LF (GAUZE/BANDAGES/DRESSINGS) ×4 IMPLANT
GLOVE BIO SURGEON STRL SZ 6.5 (GLOVE) ×1 IMPLANT
GLOVE BIO SURGEON STRL SZ7.5 (GLOVE) ×2 IMPLANT
GLOVE BIO SURGEON STRL SZ8 (GLOVE) ×7 IMPLANT
GLOVE BIO SURGEON STRL SZ8.5 (GLOVE) ×5 IMPLANT
GLOVE BIO SURGEONS STRL SZ 6.5 (GLOVE) ×1
GLOVE BIOGEL PI IND STRL 6.5 (GLOVE) IMPLANT
GLOVE BIOGEL PI IND STRL 7.5 (GLOVE) IMPLANT
GLOVE BIOGEL PI INDICATOR 6.5 (GLOVE) ×2
GLOVE BIOGEL PI INDICATOR 7.5 (GLOVE) ×2
GLOVE ECLIPSE 6.5 STRL STRAW (GLOVE) ×2 IMPLANT
GLOVE EXAM NITRILE XL STR (GLOVE) IMPLANT
GLOVE SURG SS PI 7.0 STRL IVOR (GLOVE) ×2 IMPLANT
GOWN STRL REUS W/ TWL LRG LVL3 (GOWN DISPOSABLE) IMPLANT
GOWN STRL REUS W/ TWL XL LVL3 (GOWN DISPOSABLE) IMPLANT
GOWN STRL REUS W/TWL LRG LVL3 (GOWN DISPOSABLE) ×6
GOWN STRL REUS W/TWL XL LVL3 (GOWN DISPOSABLE) ×2
KIT BASIN OR (CUSTOM PROCEDURE TRAY) ×5 IMPLANT
KIT TURNOVER KIT B (KITS) ×5 IMPLANT
MARKER SPHERE PSV REFLC 13MM (MARKER) ×10 IMPLANT
NDL HYPO 25X1 1.5 SAFETY (NEEDLE) ×3 IMPLANT
NEEDLE HYPO 25X1 1.5 SAFETY (NEEDLE) ×5 IMPLANT
NS IRRIG 1000ML POUR BTL (IV SOLUTION) ×7 IMPLANT
OIL CARTRIDGE MAESTRO DRILL (MISCELLANEOUS) ×5
PACK BATTERY CMF DISP FOR DVR (ORTHOPEDIC DISPOSABLE SUPPLIES) ×2 IMPLANT
PACK CRANIOTOMY CUSTOM (CUSTOM PROCEDURE TRAY) ×2 IMPLANT
PAD ARMBOARD 7.5X6 YLW CONV (MISCELLANEOUS) ×15 IMPLANT
PIN MAYFIELD SKULL DISP (PIN) ×5 IMPLANT
SCREW UNIII AXS SD 1.5X4 (Screw) ×6 IMPLANT
SET CARTRIDGE AND TUBING (SET/KITS/TRAYS/PACK) ×2 IMPLANT
SPONGE SURGIFOAM ABS GEL 100 (HEMOSTASIS) ×2 IMPLANT
SPONGE SURGIFOAM ABS GEL SZ50 (HEMOSTASIS) ×3 IMPLANT
STAPLER SKIN PROX WIDE 3.9 (STAPLE) ×5 IMPLANT
SUT VIC AB 2-0 CP2 18 (SUTURE) ×7 IMPLANT
SWAB COLLECTION DEVICE MRSA (MISCELLANEOUS) ×2 IMPLANT
SWAB CULTURE ESWAB REG 1ML (MISCELLANEOUS) ×2 IMPLANT
SYR 5ML LL (SYRINGE) ×6 IMPLANT
SYR 5ML LUER SLIP (SYRINGE) ×6 IMPLANT
TAPE CLOTH SURG 4X10 WHT LF (GAUZE/BANDAGES/DRESSINGS) ×2 IMPLANT
TIP STANDARD 36KHZ (INSTRUMENTS) ×5
TIP STD 36KHZ (INSTRUMENTS) IMPLANT
TOWEL GREEN STERILE (TOWEL DISPOSABLE) ×5 IMPLANT
TOWEL GREEN STERILE FF (TOWEL DISPOSABLE) ×5 IMPLANT
WATER STERILE IRR 1000ML POUR (IV SOLUTION) ×5 IMPLANT
WRENCH TORQUE 36KHZ (INSTRUMENTS) ×2 IMPLANT

## 2020-05-16 NOTE — Anesthesia Procedure Notes (Signed)
Procedure Name: Intubation Date/Time: 05/16/2020 8:40 AM Performed by: Myna Bright, CRNA Pre-anesthesia Checklist: Emergency Drugs available, Suction available, Patient being monitored and Patient identified Patient Re-evaluated:Patient Re-evaluated prior to induction Oxygen Delivery Method: Circle system utilized Preoxygenation: Pre-oxygenation with 100% oxygen Induction Type: IV induction Ventilation: Mask ventilation without difficulty Laryngoscope Size: Mac and 3 Grade View: Grade I Tube type: Oral Tube size: 7.0 mm Number of attempts: 1 Airway Equipment and Method: Stylet Placement Confirmation: ETT inserted through vocal cords under direct vision,  positive ETCO2 and breath sounds checked- equal and bilateral Secured at: 21 cm Tube secured with: Tape Dental Injury: Teeth and Oropharynx as per pre-operative assessment

## 2020-05-16 NOTE — Op Note (Signed)
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  She was treated empirically for toxoplasmosis but the lesion did not resolve and in fact enlarged.  I discussed the various treatment options with her, including surgery.  She has weighed the risks, benefits and alternatives and decided proceed with a right craniotomy for resection of this lesion.  Preop diagnosis: Right temporal brain tumor  Postop diagnosis: The same  Procedure: Right temporal craniectomy for gross total resection of right temporal brain tumor using stereotactic navigation and micro dissection.  Surgeon: Dr. Earle Gell  Assistant: Dr. Dayton Bailiff and Arnetha Massy, NP  Anesthesia: General tracheal  Estimate blood loss: 100 cc  Specimens: Tumor and cultures  Drains: None  Complications: None  Description of procedure: The patient was brought to the operating room by the anesthesia team.  General endotracheal anesthesia was induced.  She remained in the supine position.  I applied the [place] three-point headrest to her calvarium.  A roll was placed on her right shoulder.  Her head was turned to the left exposing her right scalp.  We entered the surface registration points into the [place] computer.  Her right scalp was then shaved with clippers and prepared with Betadine scrub and Betadine solution.  Sterile drapes were applied.  I then injected the area to be incised with Marcaine with epinephrine solution.  I used a scalpel to make a linear temporal incision just over the patient's right ear.  We used electrocautery to expose the underlying calvarium.  We inserted the cerebellar retractor for exposure.  We confirmed we were in the right location with the BrainLab probe.  I then used the high-speed drill to create a right temporal craniectomy over the tumor.  I then incised the exposed dura in a cruciate fashion.  I then used bipolar  electrocautery to create a small corticotomy over the tumor.  We dissected deeper and encountered the tumor at approximately 5 mm.  The tumor was quite firm and pale.  We sent off multiple specimens for both frozen and permanent section.  We also obtained cultures.  I did not see any obvious necrotic material.  Because the tumor was quite firm and was not "suckable" we used the [place] to internally debulk the tumor.  This gave [place] room to dissect around the edges of the tumor until we encountered normal brain.  We dissected circumferentially and achieved what we felt was a gross total resection of the tumor.  We then used bipolar cautery to create hemostasis.  We lined the tumor resection cavity with Surgicel.  We then placed Gelfoam over the exposed dural edges.  We then covered the craniectomy with a large bur hole cover which we secured with titanium mini screws.  We then remove the cerebellar retractor and reapproximated the patient's temporalis muscle and fascia with interrupted 2-0 Vicryl suture.  We reapproximated the galea with interrupted 2-0 Vicryl suture.  We reapproximate skin with stainless steel staples.  The wound was then coated with bacitracin ointment.  A sterile dressing was applied.  The drapes were removed.  I then removed the [place] three-point headrest from the patient's calvarium.  By report all sponge, instrument, and needle counts were correct at the end of this case.

## 2020-05-16 NOTE — H&P (Signed)
Subjective: The patient is a 29 year old black female with history of HIV who presented with headaches and a right temporal brain tumor.  This was treated empirically for toxoplasmosis and has not spotted to treatment and in fact got larger.  I discussed the various treatment options with her.  She has decided to proceed with surgery.  Past Medical History:  Diagnosis Date  . 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 Adrenal insufficiency ([place])   . Anemia of chronic disease 03/11/2012  . Back pain of lumbar region with sciatica 02/12/2015  . Bell's palsy 08/26/2013  . Brain lesion   . 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 Chronic back pain   . Chronic leg pain    bilateral knees, ankles  . Depression   . Fatigue   . GERD (gastroesophageal reflux disease)   . Headache   . 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 HIV (human immunodeficiency virus infection) ([place]) 02/2012  . Laceration of ankle, right 11/18/2012  . Lumbar radiculopathy   . Meningitis 02/18/2018  . Pelvic pain   . PID (acute pelvic inflammatory disease) 02/26/2018  . Pneumonia   . Reflux esophagitis 03/11/2012  . Seizure ([place])   . Tuberculosis   . 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 Vertigo   . Wears glasses     Past Surgical History:  Procedure Laterality Date  . APPENDECTOMY  ~ 2000  . DILATION AND CURETTAGE OF UTERUS  2008  . ESOPHAGOGASTRODUODENOSCOPY  03/11/2012   Procedure: ESOPHAGOGASTRODUODENOSCOPY (EGD);  Surgeon: Lafayette Dragon, MD;  Location: [facility] ENDOSCOPY;  Service: Endoscopy;  Laterality: N/A;  . ESOPHAGOGASTRODUODENOSCOPY N/A 03/07/2014   Procedure: ESOPHAGOGASTRODUODENOSCOPY (EGD);  Surgeon: Gatha Mayer, MD;  Location: [facility] ENDOSCOPY;  Service: Endoscopy;  Laterality: N/A;  . LUNG BIOPSY  02/2012  . TOTAL HIP ARTHROPLASTY Left 12/14/2015   Procedure: LEFT TOTAL HIP ARTHROPLASTY ANTERIOR APPROACH;  Surgeon: Mcarthur Rossetti, MD;  Location: [facility] ORS;  Service: Orthopedics;  Laterality: Left;  . TOTAL HIP  ARTHROPLASTY Right 04/04/2016   Procedure: RIGHT TOTAL HIP ARTHROPLASTY ANTERIOR APPROACH;  Surgeon: Mcarthur Rossetti, MD;  Location: [facility] ORS;  Service: Orthopedics;  Laterality: Right;    Allergies  Allergen Reactions  . Hydrocodone Itching and Nausea Only    Tolerates Oxycodone  . Tramadol Itching and Nausea Only    Tolerates oxycodone    Social History   Tobacco Use  . Smoking status: Never Smoker  . Smokeless tobacco: Never Used  Substance Use Topics  . Alcohol use: Not Currently    Alcohol/week: 0.0 standard drinks    Comment: socially    Family History  Problem Relation Age of Onset  . Heart disease Father        Vague not clearly cardiac  . Hypertension Mother    Prior to Admission medications   Medication Sig Start Date End Date Taking? Authorizing Provider  amitriptyline (ELAVIL) 10 MG tablet Take 1 tablet PO at night for 1 week then increase to 2 tablets thereafter Patient taking differently: Take 10 mg by mouth. Take 1 tablet PO at night for 1 week then increase to 2 tablets thereafter 05/07/20  Yes Ward Givens, NP  Darunavir-Cobicisctat-Emtricitabine-Tenofovir Alafenamide (SYMTUZA) 800-150-200-10 MG TABS Take 1 tablet by mouth daily with breakfast. 03/22/20  Yes Carlyle Basques, MD  dolutegravir (TIVICAY) 50 MG tablet Take 1 tablet (50 mg total) by mouth daily. 03/22/20  Yes Carlyle Basques, MD  levETIRAcetam (KEPPRA) 500 MG tablet Take 1 tablet (500 mg total) by mouth 2 (  two) times daily. 03/22/20  Yes Carlyle Basques, MD  naproxen (NAPROSYN) 500 MG tablet Take 1 tablet (500 mg total) by mouth 2 (two) times daily with a meal. 02/23/20  Yes Caccavale, Sophia, PA-C  pantoprazole (PROTONIX) 40 MG tablet Take 1 tablet (40 mg total) by mouth daily. 02/20/20 05/16/20 Yes Lattie Haw, MD  sulfamethoxazole-trimethoprim (BACTRIM DS) 800-160 MG tablet Take 1 tablet by mouth 2 (two) times daily. Patient not taking: Reported on 05/09/2020 03/22/20   Carlyle Basques, MD  SUMAtriptan  (IMITREX) 50 MG tablet Take 1 tablet (50 mg total) by mouth every 2 (two) hours as needed for migraine (Maximum dose: 100 mg per dose; 200 mg per 24 hours). May repeat in 2 hours if headache persists or recurs. 10/26/19 01/31/20  Caroline More, DO     Review of Systems  Positive ROS: As above  All other systems have been reviewed and were otherwise negative with the exception of those mentioned in the HPI and as above.  Objective: Vital signs in last 24 hours: Temp:  [99 F (37.2 C)] 99 F (37.2 C) (06/02 0643) Pulse Rate:  [88] 88 (06/02 0643) Resp:  [17] 17 (06/02 0643) BP: (123)/(90) 123/90 (06/02 0643) SpO2:  [100 %] 100 % (06/02 0643) Weight:  [84.4 kg] 84.4 kg (06/02 0643) Estimated body mass index is 30.95 kg/m as calculated from the following:   Height as of this encounter: 5\' 5"  (1.651 m).   Weight as of this encounter: 84.4 kg.   General Appearance: Alert Head: Normocephalic, without obvious abnormality, atraumatic Eyes: PERRL, conjunctiva/corneas clear, EOM's intact,    Ears: Normal  Throat: Normal  Neck: Supple, Back: unremarkable Lungs: Clear to auscultation bilaterally, respirations unlabored Heart: Regular rate and rhythm, no murmur, rub or gallop Abdomen: Soft, non-tender Extremities: Extremities normal, atraumatic, no cyanosis or edema Skin: unremarkable  NEUROLOGIC:   Mental status: alert and oriented,Motor Exam - grossly normal Sensory Exam - grossly normal Reflexes:  Coordination - grossly normal Gait - grossly normal Balance - grossly normal Cranial Nerves: I: smell Not tested  II: visual acuity  OS: Normal  OD: Normal   II: visual fields Full to confrontation  II: pupils Equal, round, reactive to light  III,VII: ptosis None  III,IV,VI: extraocular muscles  Full ROM  V: mastication Normal  V: facial light touch sensation  Normal  V,VII: corneal reflex  Present  VII: facial muscle function - upper  Normal  VII: facial muscle function -  lower Normal  VIII: hearing Not tested  IX: soft palate elevation  Normal  IX,X: gag reflex Present  XI: trapezius strength  5/5  XI: sternocleidomastoid strength 5/5  XI: neck flexion strength  5/5  XII: tongue strength  Normal    Data Review Lab Results  Component Value Date   WBC 5.3 05/10/2020   HGB 11.6 (L) 05/10/2020   HCT 35.6 (L) 05/10/2020   MCV 91.5 05/10/2020   PLT 191 05/10/2020   Lab Results  Component Value Date   NA 139 05/11/2020   K 4.3 05/11/2020   CL 113 (H) 05/11/2020   CO2 19 (L) 05/11/2020   BUN <5 (L) 05/11/2020   CREATININE 0.81 05/11/2020   GLUCOSE 90 05/11/2020   Lab Results  Component Value Date   INR 1.0 02/08/2020    Assessment/Plan: Right temporal brain tumor: I have discussed the situation with the patient.  We have discussed the various treatment options including a stereotactic brain biopsy versus stereotactic craniotomy for resection of  the tumor.  I recommend the latter.  I explained the procedure and the risks including risk of anesthesia, hemorrhage, infection, seizures, incomplete resection, recurrent growth, medical risk, etc.  We discussed the expected postoperative course and likelihood of achieving our goals with surgery.  I have answered all her questions.  She has decided to proceed with surgery.   Ophelia Charter 05/16/2020 8:17 AM

## 2020-05-16 NOTE — Anesthesia Procedure Notes (Signed)
Arterial Line Insertion Start/End6/01/2020 7:55 AM, 05/16/2020 8:05 AM Performed by: Wilburn Cornelia, CRNA, CRNA  Patient location: Pre-op. Preanesthetic checklist: patient identified, IV checked, site marked, risks and benefits discussed, surgical consent, monitors and equipment checked, pre-op evaluation and timeout performed Lidocaine 1% used for infiltration Left, radial was placed Catheter size: 20 G Hand hygiene performed , maximum sterile barriers used  and Seldinger technique used Allen's test indicative of satisfactory collateral circulation Attempts: 2 (1st attempt by Barrett Henle, CRNA) Procedure performed without using ultrasound guided technique. Following insertion, dressing applied and Biopatch. Post procedure assessment: normal  Patient tolerated the procedure well with no immediate complications.

## 2020-05-16 NOTE — Transfer of Care (Signed)
Immediate Anesthesia Transfer of Care Note  Patient: Kelsey Rodriguez  Procedure(s) Performed: Craniotomy for Resection of Lesion (Right Head) APPLICATION OF CRANIAL NAVIGATION (N/A Head)  Patient Location: PACU  Anesthesia Type:General  Level of Consciousness: awake, alert  and patient cooperative  Airway & Oxygen Therapy: Patient Spontanous Breathing and Patient connected to face mask oxygen  Post-op Assessment: Report given to RN, Post -op Vital signs reviewed and stable and Patient moving all extremities  Post vital signs: Reviewed and stable  Last Vitals:  Vitals Value Taken Time  BP    Temp    Pulse    Resp 15 05/16/20 1201  SpO2    Vitals shown include unvalidated device data.  Last Pain:  Vitals:   05/16/20 0705  TempSrc:   PainSc: 5          Complications: No apparent anesthesia complications

## 2020-05-16 NOTE — Progress Notes (Signed)
Subjective: The patient is somnolent but in no apparent distress.  Objective: Vital signs in last 24 hours: Temp:  [97 F (36.1 C)-99 F (37.2 C)] 97 F (36.1 C) (06/02 1158) Pulse Rate:  [88] 88 (06/02 0643) Resp:  [16-18] 16 (06/02 1213) BP: (123-135)/(90-96) 135/92 (06/02 1213) SpO2:  [96 %-100 %] 96 % (06/02 1213) Arterial Line BP: (144-151)/(80-83) 144/80 (06/02 1213) Weight:  [84.4 kg] 84.4 kg (06/02 0643) Estimated body mass index is 30.95 kg/m as calculated from the following:   Height as of this encounter: 5\' 5"  (1.651 m).   Weight as of this encounter: 84.4 kg.   Intake/Output from previous day: No intake/output data recorded. Intake/Output this shift: Total I/O In: 900 [I.V.:900] Out: 50 [Blood:50]  Physical exam the patient is somnolent but will follow commands and answer simple questions.  Her pupils are equal.  She is moving all 4 extremities well.  Lab Results: No results for input(s): WBC, HGB, HCT, PLT in the last 72 hours. BMET No results for input(s): NA, K, CL, CO2, GLUCOSE, BUN, CREATININE, CALCIUM in the last 72 hours.  Studies/Results: No results found.  Assessment/Plan: The patient is doing well.  I spoke with her mother.  I could not find her husband in the waiting room.  LOS: 0 days     Ophelia Charter 05/16/2020, 12:26 PM

## 2020-05-17 ENCOUNTER — Encounter: Payer: Self-pay | Admitting: *Deleted

## 2020-05-17 LAB — SURGICAL PATHOLOGY

## 2020-05-17 MED ORDER — DOCUSATE SODIUM 100 MG PO CAPS: 100.0000 mg | ORAL_CAPSULE | Freq: Two times a day (BID) | ORAL | 0 refills | Status: DC

## 2020-05-17 MED ORDER — DEXAMETHASONE 4 MG PO TABS: 4.0000 mg | ORAL_TABLET | Freq: Four times a day (QID) | ORAL | 0 refills | Status: AC

## 2020-05-17 MED ORDER — DEXAMETHASONE 4 MG PO TABS
4.0000 mg | ORAL_TABLET | Freq: Four times a day (QID) | ORAL | Status: DC
  Administered 2020-05-17: 4 mg via ORAL
  Filled 2020-05-17: qty 1

## 2020-05-17 NOTE — Discharge Summary (Signed)
Physician Discharge Summary  Patient ID: Kelsey Rodriguez MRN: [id number] DOB/AGE: 01/19/1991 29 y.o.  Admit date: 05/16/2020 Discharge date: 05/17/2020  Admission Diagnoses: Right temporal brain tumor  Discharge Diagnoses: The same Active Problems:   S/P craniotomy   Brain tumor [facility])   Discharged Condition: good  Hospital Course: I performed a right stereotactic craniectomy on the patient on 05/16/2020.  The surgery went well.  The patient's postoperative course was unremarkable.  On postoperative day #1 she felt and looked well and requested discharge to home.  The patient and her husband were given written and oral discharge instructions.  All her questions were answered.  Consults: None Significant Diagnostic Studies: None Treatments: Right stereotactic temporal craniectomy for tumor resection using microdissection. Discharge Exam: Blood pressure 129/79, pulse 81, temperature 98.7 F (37.1 C), temperature source Oral, resp. rate 14, height 5\' 5"  (1.651 m), weight 88 kg, last menstrual period 04/26/2020, SpO2 98 %. The patient is alert and pleasant.  Her speech and strength is normal.  Her dressing is clean and dry.  She looks well.  Disposition: Home   Allergies as of 05/17/2020      Reactions   Hydrocodone Itching, Nausea Only   Tolerates Oxycodone   Tramadol Itching, Nausea Only   Tolerates oxycodone      Medication List    STOP taking these medications   naproxen 500 MG tablet Commonly known as: NAPROSYN     TAKE these medications   amitriptyline 10 MG tablet Commonly known as: ELAVIL Take 1 tablet PO at night for 1 week then increase to 2 tablets thereafter What changed:   how much to take  how to take this   dexamethasone 4 MG tablet Commonly known as: DECADRON Take 1 tablet (4 mg total) by mouth 4 (four) times daily for 2 days.   docusate sodium 100 MG capsule Commonly known as: COLACE Take 1 capsule (100 mg total) by mouth 2 (two) times daily.    levETIRAcetam 500 MG tablet Commonly known as: Keppra Take 1 tablet (500 mg total) by mouth 2 (two) times daily.   pantoprazole 40 MG tablet Commonly known as: PROTONIX Take 1 tablet (40 mg total) by mouth daily.   sulfamethoxazole-trimethoprim 800-160 MG tablet Commonly known as: BACTRIM DS Take 1 tablet by mouth 2 (two) times daily.   Symtuza 800-150-200-10 MG Tabs Generic drug: Darunavir-Cobicisctat-Emtricitabine-Tenofovir Alafenamide Take 1 tablet by mouth daily with breakfast.   Tivicay 50 MG tablet Generic drug: dolutegravir Take 1 tablet (50 mg total) by mouth daily.        Signed: Ophelia Charter 05/17/2020, 10:40 AM

## 2020-05-17 NOTE — Discharge Instructions (Signed)
Craniotomy, Care After This sheet gives you information about how to care for yourself after your procedure. Your doctor may also give you more specific instructions. If you have problems or questions, contact your doctor. Follow these instructions at home: Wound care   Follow instructions from your doctor about how to take care of your cut from surgery (incision) and pin insertion areas. Make sure you: ? Wash your hands with soap and water before you change your bandage (dressing). If you cannot use soap and water, use hand sanitizer. ? Change your bandage as told by your doctor. ? Leave stitches (sutures), skin glue, or skin tape (adhesive) strips in place. They may need to stay in place for 2 weeks or longer. If tape strips get loose and curl up, you may trim the loose edges. Do not remove tape strips completely unless your doctor says it is okay.  Check your cut area and pin insertion sites every day for signs of infection. Check for: ? Redness, swelling, or pain. ? Fluid or blood. ? Warmth. ? Pus or a bad smell.  Do not take baths, swim, or use a hot tub until your doctor says it is okay. Activity  Limit your activities as told by your doctor. You may then slowly increase your activities as told by your doctor. Avoid contact sports for 1 year or as told by your doctor.  Do not drive until your doctor says it is okay.  Rest and sleep as much as possible. When you lie down, keep your head raised (elevated) at a 30-degree angle. You can do this with some pillows. This helps keep brain swelling down and prevents increased pressure in the head. Ask your doctor when you can go back to sleeping flat.  Ask your doctor when you can go back to work. General instructions   Wear a helmet as told by your doctor.  Do not drink alcohol.  Take over-the-counter and prescription medicines only as told by your doctor.  To prevent or treat constipation while you are taking prescription pain  medicine, your doctor may suggest that you: ? Drink enough fluid to keep your pee (urine) clear or pale yellow. ? Take over-the-counter or prescription medicines. ? Eat foods that are high in fiber. Such foods include:  Fresh fruits.  Fresh vegetables.  Whole grains.  Beans. ? Limit foods that are high in fat and processed sugars, such as fried and sweet foods. Contact a doctor if:  You have redness, swelling, or pain around your cut area or pin insertion areas.  You have fluid or blood coming from your cut area or pin insertion areas.  Your cut area or pin insertion areas feel warm to the touch.  You have pus or a bad smell coming from your cut area or pin insertion areas.  You have a fever. Get help right away if:  You have a very bad headache.  You have uncontrolled shaking or jerking movements (seizures).  You feel confused.  You pass out (faint).  You feel sick to your stomach (nausea) and you throw up (vomit) and it does not stop.  You feel weak or numb on one side of your body.  Your speech is slurred.  You have chest pain, a stiff neck, or trouble breathing.  You have blurry vision or loss of vision.  You have swelling or bruising around the eyes.  Your wound breaks open after the stitches or staples are taken out.  You have a rash.  You feel dizzy. Summary  Check your cut area and pin insertion areas every day for signs of infection. Signs of infection include redness, swelling, drainage, or fever.  After the procedure, return to activities slowly. Rest when you get tired.  Eat foods that are high in fiber, such as fresh fruits and vegetables, whole grains, and beans. Drink plenty of fluids to avoid constipation after surgery, especially if you are taking pain medicines. This information is not intended to replace advice given to you by your health care provider. Make sure you discuss any questions you have with your health care provider. Document  Revised: 11/13/2017 Document Reviewed: 12/19/2016 Elsevier Patient Education  2020 [employer].

## 2020-05-17 NOTE — Anesthesia Postprocedure Evaluation (Signed)
Anesthesia Post Note  Patient: Kelsey Rodriguez  Procedure(s) Performed: Craniotomy for Resection of Lesion (Right Head) APPLICATION OF CRANIAL NAVIGATION (N/A Head)     Patient location during evaluation: PACU Anesthesia Type: General Level of consciousness: awake and alert Pain management: pain level controlled Vital Signs Assessment: post-procedure vital signs reviewed and stable Respiratory status: spontaneous breathing, nonlabored ventilation, respiratory function stable and patient connected to nasal cannula oxygen Cardiovascular status: blood pressure returned to baseline and stable Postop Assessment: no apparent nausea or vomiting Anesthetic complications: no    Last Vitals:  Vitals:   05/17/20 0700 05/17/20 0800  BP: 114/84 127/85  Pulse: 88 84  Resp: (!) 23 15  Temp:  37.1 C  SpO2: 100% 99%    Last Pain:  Vitals:   05/17/20 0800  TempSrc: Oral  PainSc: 0-No pain                 Duard Spiewak S

## 2020-05-17 NOTE — Progress Notes (Signed)
Pt d/c to home by car with family. Assessment stable. D/C instructions reviewed and all questions answered. 

## 2020-05-21 LAB — AEROBIC/ANAEROBIC CULTURE W GRAM STAIN (SURGICAL/DEEP WOUND): Culture: NORMAL

## 2020-05-23 ENCOUNTER — Ambulatory Visit: Payer: Medicaid Other | Admitting: Internal Medicine

## 2020-05-28 ENCOUNTER — Other Ambulatory Visit: Payer: Medicaid Other

## 2020-06-14 DIAGNOSIS — Z419 Encounter for procedure for purposes other than remedying health state, unspecified: Secondary | ICD-10-CM | POA: Diagnosis not present

## 2020-06-22 ENCOUNTER — Encounter: Payer: Self-pay | Admitting: Adult Health

## 2020-06-25 ENCOUNTER — Other Ambulatory Visit: Payer: Self-pay

## 2020-06-25 ENCOUNTER — Encounter (HOSPITAL_COMMUNITY): Payer: Self-pay | Admitting: Emergency Medicine

## 2020-06-25 ENCOUNTER — Emergency Department (HOSPITAL_COMMUNITY)
Admission: EM | Admit: 2020-06-25 | Discharge: 2020-06-26 | Disposition: A | Discharge status: Home / Self Care | Payer: Medicaid Other | Attending: Emergency Medicine | Admitting: Emergency Medicine

## 2020-06-25 DIAGNOSIS — R519 Headache, unspecified: Secondary | ICD-10-CM | POA: Diagnosis not present

## 2020-06-25 DIAGNOSIS — Z5321 Procedure and treatment not carried out due to patient leaving prior to being seen by health care provider: Secondary | ICD-10-CM | POA: Diagnosis not present

## 2020-06-25 LAB — URINALYSIS, ROUTINE W REFLEX MICROSCOPIC
Bilirubin Urine: NEGATIVE
Glucose, UA: NEGATIVE mg/dL
Hgb urine dipstick: NEGATIVE
Ketones, ur: NEGATIVE mg/dL
Nitrite: NEGATIVE
Protein, ur: NEGATIVE mg/dL
Specific Gravity, Urine: 1.02 (ref 1.005–1.030)
pH: 6 (ref 5.0–8.0)

## 2020-06-25 LAB — COMPREHENSIVE METABOLIC PANEL
ALT: 18 U/L (ref 0–44)
AST: 21 U/L (ref 15–41)
Albumin: 4.1 g/dL (ref 3.5–5.0)
Alkaline Phosphatase: 90 U/L (ref 38–126)
Anion gap: 9 (ref 5–15)
BUN: 7 mg/dL (ref 6–20)
CO2: 23 mmol/L (ref 22–32)
Calcium: 9 mg/dL (ref 8.9–10.3)
Chloride: 105 mmol/L (ref 98–111)
Creatinine, Ser: 0.74 mg/dL (ref 0.44–1.00)
GFR calc Af Amer: >60 mL/min (ref >60)
GFR calc non Af Amer: >60 mL/min (ref >60)
Glucose, Bld: 96 mg/dL (ref 70–99)
Potassium: 4.2 mmol/L (ref 3.5–5.1)
Sodium: 137 mmol/L (ref 135–145)
Total Bilirubin: 0.7 mg/dL (ref 0.3–1.2)
Total Protein: 7.8 g/dL (ref 6.5–8.1)

## 2020-06-25 LAB — CBC WITH DIFFERENTIAL/PLATELET
Abs Immature Granulocytes: 0.01 10*3/uL (ref 0.00–0.07)
Basophils Absolute: 0 10*3/uL (ref 0.0–0.1)
Basophils Relative: 0 %
Eosinophils Absolute: 0.2 10*3/uL (ref 0.0–0.5)
Eosinophils Relative: 4 %
HCT: 36.4 % (ref 36.0–46.0)
Hemoglobin: 11.7 g/dL — ABNORMAL LOW (ref 12.0–15.0)
Immature Granulocytes: 0 %
Lymphocytes Relative: 27 %
Lymphs Abs: 1.4 10*3/uL (ref 0.7–4.0)
MCH: 28.8 pg (ref 26.0–34.0)
MCHC: 32.1 g/dL (ref 30.0–36.0)
MCV: 89.7 fL (ref 80.0–100.0)
Monocytes Absolute: 0.4 10*3/uL (ref 0.1–1.0)
Monocytes Relative: 9 %
Neutro Abs: 3 10*3/uL (ref 1.7–7.7)
Neutrophils Relative %: 60 %
Platelets: 213 10*3/uL (ref 150–400)
RBC: 4.06 MIL/uL (ref 3.87–5.11)
RDW: 17 % — ABNORMAL HIGH (ref 11.5–15.5)
WBC: 5 10*3/uL (ref 4.0–10.5)
nRBC: 0 % (ref 0.0–0.2)

## 2020-06-25 LAB — I-STAT BETA HCG BLOOD, ED (MC, WL, AP ONLY): I-stat hCG, quantitative: <5 m[IU]/mL (ref <5)

## 2020-06-25 MED ORDER — IBUPROFEN 400 MG PO TABS
400.0000 mg | ORAL_TABLET | Freq: Once | ORAL | Status: AC
  Administered 2020-06-25: 400 mg via ORAL
  Filled 2020-06-25: qty 1

## 2020-06-25 NOTE — ED Triage Notes (Signed)
Pt c/o HA for a week with some change on vision, pt state she had a brain surgery last month.

## 2020-06-25 NOTE — Telephone Encounter (Signed)
Since she had a craniotomy- she should reach out to surgeon first.

## 2020-06-26 ENCOUNTER — Encounter: Payer: Self-pay | Admitting: Family Medicine

## 2020-06-26 NOTE — ED Notes (Signed)
Patient states she will come back in the morning when the wait time is not as long

## 2020-06-28 ENCOUNTER — Other Ambulatory Visit: Payer: Self-pay

## 2020-06-28 ENCOUNTER — Ambulatory Visit: Payer: Medicaid Other | Admitting: Adult Health

## 2020-06-28 DIAGNOSIS — R519 Headache, unspecified: Secondary | ICD-10-CM

## 2020-06-28 MED ORDER — AMITRIPTYLINE HCL 10 MG PO TABS: 30.0000 mg | ORAL_TABLET | Freq: Every day | ORAL | 11 refills | Status: DC

## 2020-06-28 NOTE — Patient Instructions (Signed)
Your Plan:  Increase Amitriptyline 30 mg at bedtime  If your symptoms worsen or you develop new symptoms please let us know.   Thank you for coming to see [place] at [facility]. I hope we have been able to provide you high quality care today.  You may receive a patient satisfaction survey over the next few weeks. We would appreciate your feedback and comments so that we may continue to improve ourselves and the health of our patients.

## 2020-06-28 NOTE — Progress Notes (Addendum)
PATIENT: Kelsey Rodriguez DOB: 21-Jan-1991  REASON FOR VISIT: follow up HISTORY FROM: patient  HISTORY OF PRESENT ILLNESS: Today 06/28/20:  [name]  is a 29 year old female with a history of headaches, HIV and CNS toxoplasmosis.  She returns today for follow-up.  The patient had a craniotomy for an abscess at the beginning of June.  This was performed by Dr. Arnoldo Morale.  She states that she was doing well in regards to her headaches but they returned this past week.  She states that on Tuesday her headache was severe that she went to the emergency room.  She did have a low-grade temperature reports that she said in the emergency department and eventually left without being seen.  She states that her headache severity has slightly improved but she continues to have a headache.  Primarily on the right side.  She reports some light sensitivity.  States that she has blurry and double vision and flashing lights particularly with positional changes.  She is only noticed the visual changes when a headache is present.  She denies any weakness in the arms or legs.  Denies any numbness or tingling.  She returns today for an evaluation.  HISTORY 05/07/20:  [name] is a 29 year old female with a history of headaches, HIV and CNS toxoplasmosis.  She returns today for follow-up.  She states that she has had headaches off and on since her last visit.  She is most concerned about a headache that started 4 days ago.  She states that it is primarily on the right side.  Denies any new numbness or tingling.  No changes with her vision.  She reports photophobia.  Reports a new symptom of tinnitus in the left ear.  Describes it as an engine roaring with variation of intensity.  Reports that on occasion it is so loud that she cannot hear people when they are talking to her.  She does have a upcoming appointment with her infectious disease physician.  She returns today for an evaluation.   REVIEW OF SYSTEMS:  Out of a complete 14 system review of symptoms, the patient complains only of the following symptoms, and all other reviewed systems are negative.  ALLERGIES: Allergies  Allergen Reactions  . Hydrocodone Itching and Nausea Only    Tolerates Oxycodone  . Tramadol Itching and Nausea Only    Tolerates oxycodone    HOME MEDICATIONS: Outpatient Medications Prior to Visit  Medication Sig Dispense Refill  . amitriptyline (ELAVIL) 10 MG tablet Take 1 tablet PO at night for 1 week then increase to 2 tablets thereafter (Patient taking differently: Take 10 mg by mouth. Take 1 tablet PO at night for 1 week then increase to 2 tablets thereafter) 60 tablet 3  . Darunavir-Cobicisctat-Emtricitabine-Tenofovir Alafenamide (SYMTUZA) 800-150-200-10 MG TABS Take 1 tablet by mouth daily with breakfast. 30 tablet 11  . docusate sodium (COLACE) 100 MG capsule Take 1 capsule (100 mg total) by mouth 2 (two) times daily. 60 capsule 0  . dolutegravir (TIVICAY) 50 MG tablet Take 1 tablet (50 mg total) by mouth daily. 30 tablet 11  . levETIRAcetam (KEPPRA) 500 MG tablet Take 1 tablet (500 mg total) by mouth 2 (two) times daily. 60 tablet 3  . pantoprazole (PROTONIX) 40 MG tablet Take 1 tablet (40 mg total) by mouth daily. 30 tablet 0  . sulfamethoxazole-trimethoprim (BACTRIM DS) 800-160 MG tablet Take 1 tablet by mouth 2 (two) times daily. (Patient not taking: Reported on 05/09/2020) 60 tablet 3  No facility-administered medications prior to visit.    PAST MEDICAL HISTORY: Past Medical History:  Diagnosis Date  . 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 Adrenal insufficiency ([place])   . Anemia of chronic disease 03/11/2012  . Back pain of lumbar region with sciatica 02/12/2015  . Bell's palsy 08/26/2013  . Brain lesion   . 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 Chronic back pain   . Chronic leg pain    bilateral knees, ankles  . Depression   . Fatigue   . GERD (gastroesophageal reflux disease)   . Headache   . 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 HIV (human immunodeficiency virus infection) ([place]) 02/2012  . Laceration of ankle, right 11/18/2012  . Lumbar radiculopathy   . Meningitis 02/18/2018  . Pelvic pain   . PID (acute pelvic inflammatory disease) 02/26/2018  . Pneumonia   . Reflux esophagitis 03/11/2012  . Seizure ([place])   . Tuberculosis   . 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 Vertigo   . Wears glasses     PAST SURGICAL HISTORY: Past Surgical History:  Procedure Laterality Date  . APPENDECTOMY  ~ 2000  . APPLICATION OF CRANIAL NAVIGATION N/A 05/16/2020   Procedure: APPLICATION OF CRANIAL NAVIGATION;  Surgeon: Newman Pies, MD;  Location: [place];  Service: Neurosurgery;  Laterality: N/A;  . CRANIOTOMY Right 05/16/2020   Procedure: Craniotomy for Resection of Lesion;  Surgeon: Newman Pies, MD;  Location: [place];  Service: Neurosurgery;  Laterality: Right;  right  . DILATION AND CURETTAGE OF UTERUS  2008  . ESOPHAGOGASTRODUODENOSCOPY  03/11/2012   Procedure: ESOPHAGOGASTRODUODENOSCOPY (EGD);  Surgeon: Lafayette Dragon, MD;  Location: [facility] ENDOSCOPY;  Service: Endoscopy;  Laterality: N/A;  . ESOPHAGOGASTRODUODENOSCOPY N/A 03/07/2014   Procedure: ESOPHAGOGASTRODUODENOSCOPY (EGD);  Surgeon: Gatha Mayer, MD;  Location: [facility] ENDOSCOPY;  Service: Endoscopy;  Laterality: N/A;  . LUNG BIOPSY  02/2012  . TOTAL HIP ARTHROPLASTY Left 12/14/2015   Procedure: LEFT TOTAL HIP ARTHROPLASTY ANTERIOR APPROACH;  Surgeon: Mcarthur Rossetti, MD;  Location: [facility];  Service: Orthopedics;  Laterality: Left;  . TOTAL HIP ARTHROPLASTY Right 04/04/2016   Procedure: RIGHT TOTAL HIP ARTHROPLASTY ANTERIOR APPROACH;  Surgeon: Mcarthur Rossetti, MD;  Location: [facility];  Service: Orthopedics;  Laterality: Right;    FAMILY HISTORY: Family History  Problem Relation Age of Onset  . Heart disease Father        Vague not clearly cardiac  . Hypertension Mother     SOCIAL HISTORY: Social History    Socioeconomic History  . Marital status: Married    Spouse name: Not on file  . Number of children: 0  . Years of education: college  . Highest education level: High school graduate  Occupational History  . Occupation: CNA  Tobacco Use  . Smoking status: Never Smoker  . Smokeless tobacco: Never Used  Vaping Use  . Vaping Use: Never used  Substance and Sexual Activity  . Alcohol use: Not Currently    Alcohol/week: 0.0 standard drinks    Comment: socially  . Drug use: No  . Sexual activity: Yes    Partners: Male    Birth control/protection: None    Comment: pt. given condoms 11/2019  Other Topics Concern  . 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     Drinks about 1 soda a day    Social Determinants of Health   Financial Resource Strain:   . Difficulty of Paying  Living Expenses:   Food Insecurity:   . Worried About Charity fundraiser in the Last Year:   . Arboriculturist in the Last Year:   Transportation Needs:   . Film/video editor (Medical):   [name] Lack of Transportation (Non-Medical):   Physical Activity:   . Days of Exercise per Week:   . Minutes of Exercise per Session:   Stress:   . Feeling of Stress :   Social Connections:   . Frequency of Communication with Friends and Family:   . Frequency of Social Gatherings with Friends and Family:   . Attends Religious Services:   . Active Member of Clubs or Organizations:   . Attends Archivist Meetings:   [name] Marital Status:   Intimate Partner Violence:   . Fear of Current or Ex-Partner:   . Emotionally Abused:   [name] Physically Abused:   . Sexually Abused:       PHYSICAL EXAM  Vitals:   06/28/20 0833  BP: 123/82  Pulse: 90  Weight: 185 lb (83.9 kg)  Height: 5\' 4"  (1.626 m)   Body mass index is 31.76 kg/m.  Generalized: Well developed, in no acute distress   Neurological examination  Mentation: Alert oriented to time, place, history taking. Follows  all commands speech and language fluent Cranial nerve II-XII: Pupils were equal round reactive to light. Extraocular movements were full, visual field were full on confrontational test. Facial sensation and strength were normal. Uvula tongue midline. Head turning and shoulder shrug  were normal and symmetric. Motor: The motor testing reveals 5 over 5 strength of all 4 extremities. Good symmetric motor tone is noted throughout.  Sensory: Sensory testing is intact to soft touch on all 4 extremities. No evidence of extinction is noted.  Coordination: Cerebellar testing reveals good finger-nose-finger and heel-to-shin bilaterally.  Gait and station: Gait is normal.  Reflexes: Deep tendon reflexes are symmetric and normal bilaterally.   DIAGNOSTIC DATA (LABS, IMAGING, TESTING) - I reviewed patient records, labs, notes, testing and imaging myself where available.  Lab Results  Component Value Date   WBC 5.0 06/25/2020   HGB 11.7 (L) 06/25/2020   HCT 36.4 06/25/2020   MCV 89.7 06/25/2020   PLT 213 06/25/2020      Component Value Date/Time   NA 137 06/25/2020 2055   NA 138 01/04/2020 1148   K 4.2 06/25/2020 2055   CL 105 06/25/2020 2055   CO2 23 06/25/2020 2055   GLUCOSE 96 06/25/2020 2055   BUN 7 06/25/2020 2055   BUN 7 01/04/2020 1148   CREATININE 0.74 06/25/2020 2055   CREATININE 1.05 03/07/2020 1652   CALCIUM 9.0 06/25/2020 2055   PROT 7.8 06/25/2020 2055   PROT 8.2 01/04/2020 1148   ALBUMIN 4.1 06/25/2020 2055   ALBUMIN 4.8 01/04/2020 1148   AST 21 06/25/2020 2055   ALT 18 06/25/2020 2055   ALKPHOS 90 06/25/2020 2055   BILITOT 0.7 06/25/2020 2055   BILITOT 0.3 01/04/2020 1148   GFRNONAA >60 06/25/2020 2055   GFRNONAA 81 11/18/2019 1051   GFRAA >60 06/25/2020 2055   GFRAA 94 11/18/2019 1051   Lab Results  Component Value Date   CHOL 215 (H) 12/25/2016   HDL 67 12/25/2016   LDLCALC 119 (H) 12/25/2016   TRIG 144 12/25/2016   CHOLHDL 3.2 12/25/2016   Lab Results    Component Value Date   HGBA1C 5.1 04/20/2015   Lab Results  Component Value Date   VITAMINB12 >  1500 (H) 10/30/2014   Lab Results  Component Value Date   TSH 0.532 02/14/2020      ASSESSMENT AND PLAN 29 y.o. 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 here with:  Headache History of craniotomy Visual changes HIV CNS toxoplasmosis  Advised patient that I would increase amitriptyline to 30 mg to see if this offers some benefit for her headaches.  However due to her history I am concerned that we may need to repeat imaging?  Advised patient that she should call Dr. Arnoldo Morale and make him aware of her symptoms.  I will defer to him in regards to be repeat imaging.  His input would be valuable as he will be able to compare to previous images and offer US guidance if postsurgical changes are acceptable versus new structural changes.  Patient voiced understanding.  Also advised that it would be helpful for her to have an evaluation with her ophthalmologist due to visual changes.  Also advised that if she has an ongoing low grade temp she would make her infectious disease doctor aware.  I did consult with Dr. Rexene Alberts who agreed that the patient should reach out to her  neurosurgeon to make him aware of symptoms.  F/U 6 months  I spent 30 minutes of face-to-face and non-face-to-face time with patient.  This included previsit chart review, lab review, study review, order entry, electronic health record documentation, patient education.  Ward Givens, MSN, NP-C 06/28/2020, 8:37 AM [name] [address] [place], [place] [address] [phone]  I reviewed the above note and documentation by the Nurse Practitioner and agree with the history, exam, assessment and plan as outlined above. I was available for consultation and discussed the case with Ms. Clabe Seal on the date of the appt. Star Age, MD, PhD [facility])

## 2020-06-29 DIAGNOSIS — H532 Diplopia: Secondary | ICD-10-CM | POA: Diagnosis not present

## 2020-06-29 DIAGNOSIS — H04123 Dry eye syndrome of bilateral lacrimal glands: Secondary | ICD-10-CM | POA: Diagnosis not present

## 2020-06-29 DIAGNOSIS — H53453 Other localized visual field defect, bilateral: Secondary | ICD-10-CM | POA: Diagnosis not present

## 2020-06-29 DIAGNOSIS — H538 Other visual disturbances: Secondary | ICD-10-CM | POA: Diagnosis not present

## 2020-06-29 DIAGNOSIS — G43B0 Ophthalmoplegic migraine, not intractable: Secondary | ICD-10-CM | POA: Diagnosis not present

## 2020-06-29 DIAGNOSIS — H53462 Homonymous bilateral field defects, left side: Secondary | ICD-10-CM | POA: Diagnosis not present

## 2020-07-03 ENCOUNTER — Telehealth: Payer: Self-pay

## 2020-07-03 NOTE — Telephone Encounter (Signed)
Received from Dr. Zenia Resides office (the most recent eye exam).  Placed in MM/NP inbox.

## 2020-07-03 NOTE — Telephone Encounter (Signed)
Can you have her see someone this week- either greg or stephanie.

## 2020-07-03 NOTE — Telephone Encounter (Signed)
Patient called to report that she has been without medication for "a few months". States that she hasn't taken it because of her headaches and not feeling well. Didn't know she had refills at [facility]. Reports that she would go pick up medication. States that she's still experiencing headaches and vision changes. Unsure of whether or not to restart medication since she's been off; spoke with Marya Amsler, NP who stated ok to start and labs can be drawn at appointment next week with Dr. Baxter Flattery.  Michaella Imai Lorita Officer, RN

## 2020-07-03 NOTE — Telephone Encounter (Signed)
She can increase amitriptyline to 40 mg at bedtime.  Please advise patient that she should continue trying to reach her neurosurgeon.

## 2020-07-03 NOTE — Telephone Encounter (Signed)
Kelsey Rodriguez is 100% full. Patient is scheduled with Dr. Johnnye Sima for this Thursday. Thank you!  Shannara Winbush Lorita Officer, RN

## 2020-07-05 ENCOUNTER — Other Ambulatory Visit: Payer: Self-pay

## 2020-07-05 ENCOUNTER — Ambulatory Visit (INDEPENDENT_AMBULATORY_CARE_PROVIDER_SITE_OTHER): Payer: Medicaid Other | Admitting: Infectious Diseases

## 2020-07-05 ENCOUNTER — Encounter: Payer: Self-pay | Admitting: Infectious Diseases

## 2020-07-05 DIAGNOSIS — I10 Essential (primary) hypertension: Secondary | ICD-10-CM | POA: Diagnosis not present

## 2020-07-05 DIAGNOSIS — R900 Intracranial space-occupying lesion found on diagnostic imaging of central nervous system: Secondary | ICD-10-CM | POA: Diagnosis not present

## 2020-07-05 DIAGNOSIS — B20 Human immunodeficiency virus [HIV] disease: Secondary | ICD-10-CM | POA: Diagnosis not present

## 2020-07-05 NOTE — Assessment & Plan Note (Addendum)
Has had COVID vax Re-enforced need for adherence with ART- she states she simply did not refill.  Will have her see pharmacy Will have her back in 1 week.

## 2020-07-05 NOTE — Assessment & Plan Note (Signed)
Presumed to be toxo Will send her for repeat MRI.  Could consider repeat steroids? Needs Neurosurgery f/u Appreciate neuro.

## 2020-07-05 NOTE — Progress Notes (Signed)
   Subjective:    Patient ID: Kelsey Rodriguez, female    DOB: 25-Mar-1991, 29 y.o.   MRN: [id number]  HPI 29 yo F with hx of HIV+ (dx 2014, from [place], came to [place] 2011), on biktarvy.  She was in hospital 05-09-20 to 5-29 and then again 6-2 to 05-17-20. 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 No malignancy was found and path was felt to be consistent with infection (toxo not see specifically).   She had previous genotype done 01-2020 that showed background mutations.  Went to ED 7-12 but left AMA due to wait.  Has been having headaches for the last 2 weeks. Feels like she is having diplopia on L, vision changes. She was seen by Neuro last week and then sent to eye doctor. She was seen by ophtho and was told this could be from her surgery. She has also been asked to f/u with neurosurgery, apt pending.  She has been restricted from driving.   HIV 1 RNA Quant (copies/mL)  Date Value  05/10/2020 201,000  03/07/2020 693 (H)  02/05/2020 131,000   CD4 T Cell Abs (/uL)  Date Value  05/10/2020 107 (L)  03/07/2020 45 (L)  11/18/2019 36 (L)   Has been out of biktarvy for 3 weeks.  Has been only taking amitryptiline. Increasing doses.  Is still taking bactrim.    Review of Systems  Constitutional: Positive for chills and fever. Negative for appetite change.  Eyes: Positive for visual disturbance.  Gastrointestinal: Positive for constipation. Negative for diarrhea.  Genitourinary: Negative for difficulty urinating.  Neurological: Positive for dizziness. Negative for seizures.       Objective:   Physical Exam Vitals reviewed.  Constitutional:      Appearance: Normal appearance.  HENT:     Mouth/Throat:     Mouth: Mucous membranes are moist.     Pharynx: No oropharyngeal exudate.  Eyes:     Extraocular Movements: Extraocular movements intact.     Pupils: Pupils are equal, round, and reactive to light.  Cardiovascular:     Rate and Rhythm: Regular rhythm.  Tachycardia present.  Pulmonary:     Effort: Pulmonary effort is normal.     Breath sounds: Normal breath sounds.  Abdominal:     General: Bowel sounds are normal. There is no distension.     Palpations: Abdomen is soft.     Tenderness: There is no abdominal tenderness.  Musculoskeletal:     Cervical back: Normal range of motion and neck supple.  Neurological:     Mental Status: She is alert and oriented to person, place, and time.     Cranial Nerves: Cranial nerves are intact.     Motor: Motor function is intact. No weakness, abnormal muscle tone or seizure activity.     Coordination: Coordination is intact. Coordination normal.           Assessment & Plan:

## 2020-07-05 NOTE — Assessment & Plan Note (Signed)
Slightly elevated today Will continue to watch

## 2020-07-11 ENCOUNTER — Ambulatory Visit (INDEPENDENT_AMBULATORY_CARE_PROVIDER_SITE_OTHER): Payer: Medicaid Other | Admitting: Internal Medicine

## 2020-07-11 ENCOUNTER — Inpatient Hospital Stay (HOSPITAL_COMMUNITY)
Admission: AD | Admit: 2020-07-11 | Discharge: 2020-07-16 | DRG: 975 | Disposition: A | Discharge status: Home / Self Care | Payer: Medicaid Other | Source: Ambulatory Visit | Attending: Family Medicine | Admitting: Family Medicine

## 2020-07-11 ENCOUNTER — Telehealth: Payer: Self-pay | Admitting: *Deleted

## 2020-07-11 ENCOUNTER — Inpatient Hospital Stay (HOSPITAL_COMMUNITY): Payer: Medicaid Other

## 2020-07-11 ENCOUNTER — Encounter (HOSPITAL_COMMUNITY): Payer: Self-pay | Admitting: Family Medicine

## 2020-07-11 ENCOUNTER — Other Ambulatory Visit: Payer: Self-pay

## 2020-07-11 ENCOUNTER — Encounter: Payer: Self-pay | Admitting: Internal Medicine

## 2020-07-11 VITALS — BP 118/83 | HR 94 | Temp 98.4°F | Wt 182.0 lb

## 2020-07-11 DIAGNOSIS — Z79899 Other long term (current) drug therapy: Secondary | ICD-10-CM | POA: Diagnosis not present

## 2020-07-11 DIAGNOSIS — R569 Unspecified convulsions: Secondary | ICD-10-CM

## 2020-07-11 DIAGNOSIS — H532 Diplopia: Secondary | ICD-10-CM

## 2020-07-11 DIAGNOSIS — G40909 Epilepsy, unspecified, not intractable, without status epilepticus: Secondary | ICD-10-CM | POA: Diagnosis present

## 2020-07-11 DIAGNOSIS — B20 Human immunodeficiency virus [HIV] disease: Secondary | ICD-10-CM | POA: Diagnosis not present

## 2020-07-11 DIAGNOSIS — Z9119 Patient's noncompliance with other medical treatment and regimen: Secondary | ICD-10-CM

## 2020-07-11 DIAGNOSIS — R519 Headache, unspecified: Secondary | ICD-10-CM

## 2020-07-11 DIAGNOSIS — E271 Primary adrenocortical insufficiency: Secondary | ICD-10-CM | POA: Diagnosis not present

## 2020-07-11 DIAGNOSIS — B589 Toxoplasmosis, unspecified: Principal | ICD-10-CM | POA: Diagnosis present

## 2020-07-11 DIAGNOSIS — Z96643 Presence of artificial hip joint, bilateral: Secondary | ICD-10-CM | POA: Diagnosis not present

## 2020-07-11 DIAGNOSIS — Z91199 Patient's noncompliance with other medical treatment and regimen due to unspecified reason: Secondary | ICD-10-CM

## 2020-07-11 DIAGNOSIS — Z419 Encounter for procedure for purposes other than remedying health state, unspecified: Secondary | ICD-10-CM | POA: Diagnosis not present

## 2020-07-11 DIAGNOSIS — B999 Unspecified infectious disease: Secondary | ICD-10-CM

## 2020-07-11 DIAGNOSIS — B582 Toxoplasma meningoencephalitis: Secondary | ICD-10-CM | POA: Diagnosis present

## 2020-07-11 DIAGNOSIS — E2740 Unspecified adrenocortical insufficiency: Secondary | ICD-10-CM | POA: Diagnosis not present

## 2020-07-11 LAB — CBC WITH DIFFERENTIAL/PLATELET
Abs Immature Granulocytes: 0.01 10*3/uL (ref 0.00–0.07)
Basophils Absolute: 0 10*3/uL (ref 0.0–0.1)
Basophils Relative: 0 %
Eosinophils Absolute: 0.4 10*3/uL (ref 0.0–0.5)
Eosinophils Relative: 12 %
HCT: 37.7 % (ref 36.0–46.0)
Hemoglobin: 12.4 g/dL (ref 12.0–15.0)
Immature Granulocytes: 0 %
Lymphocytes Relative: 32 %
Lymphs Abs: 1.1 10*3/uL (ref 0.7–4.0)
MCH: 30 pg (ref 26.0–34.0)
MCHC: 32.9 g/dL (ref 30.0–36.0)
MCV: 91.3 fL (ref 80.0–100.0)
Monocytes Absolute: 0.3 10*3/uL (ref 0.1–1.0)
Monocytes Relative: 10 %
Neutro Abs: 1.6 10*3/uL — ABNORMAL LOW (ref 1.7–7.7)
Neutrophils Relative %: 46 %
Platelets: 234 10*3/uL (ref 150–400)
RBC: 4.13 MIL/uL (ref 3.87–5.11)
RDW: 16.7 % — ABNORMAL HIGH (ref 11.5–15.5)
WBC: 3.4 10*3/uL — ABNORMAL LOW (ref 4.0–10.5)
nRBC: 0 % (ref 0.0–0.2)

## 2020-07-11 LAB — COMPREHENSIVE METABOLIC PANEL
ALT: 16 U/L (ref 0–44)
AST: 20 U/L (ref 15–41)
Albumin: 4.7 g/dL (ref 3.5–5.0)
Alkaline Phosphatase: 102 U/L (ref 38–126)
Anion gap: 13 (ref 5–15)
BUN: 9 mg/dL (ref 6–20)
CO2: 23 mmol/L (ref 22–32)
Calcium: 9.9 mg/dL (ref 8.9–10.3)
Chloride: 105 mmol/L (ref 98–111)
Creatinine, Ser: 0.78 mg/dL (ref 0.44–1.00)
GFR calc Af Amer: >60 mL/min (ref >60)
GFR calc non Af Amer: >60 mL/min (ref >60)
Glucose, Bld: 87 mg/dL (ref 70–99)
Potassium: 3.9 mmol/L (ref 3.5–5.1)
Sodium: 141 mmol/L (ref 135–145)
Total Bilirubin: 0.4 mg/dL (ref 0.3–1.2)
Total Protein: 7.9 g/dL (ref 6.5–8.1)

## 2020-07-11 LAB — URINALYSIS, ROUTINE W REFLEX MICROSCOPIC
Bilirubin Urine: NEGATIVE
Glucose, UA: NEGATIVE mg/dL
Hgb urine dipstick: NEGATIVE
Ketones, ur: NEGATIVE mg/dL
Leukocytes,Ua: NEGATIVE
Nitrite: NEGATIVE
Protein, ur: NEGATIVE mg/dL
Specific Gravity, Urine: 1.021 (ref 1.005–1.030)
pH: 6 (ref 5.0–8.0)

## 2020-07-11 LAB — MAGNESIUM: Magnesium: 2.1 mg/dL (ref 1.7–2.4)

## 2020-07-11 LAB — TSH: TSH: 1.385 u[IU]/mL (ref 0.350–4.500)

## 2020-07-11 LAB — PHOSPHORUS: Phosphorus: 3.4 mg/dL (ref 2.5–4.6)

## 2020-07-11 MED ORDER — SULFAMETHOXAZOLE-TRIMETHOPRIM 800-160 MG PO TABS: 1.0000 | ORAL_TABLET | Freq: Two times a day (BID) | ORAL | Status: DC

## 2020-07-11 MED ORDER — SODIUM CHLORIDE 0.9 % IV SOLN
INTRAVENOUS | Status: DC
  Administered 2020-07-15: 1000 mL via INTRAVENOUS

## 2020-07-11 MED ORDER — ONDANSETRON HCL 4 MG PO TABS: 4.0000 mg | ORAL_TABLET | Freq: Four times a day (QID) | ORAL | Status: DC | PRN

## 2020-07-11 MED ORDER — AMITRIPTYLINE HCL 10 MG PO TABS
30.0000 mg | ORAL_TABLET | Freq: Every day | ORAL | Status: DC
  Administered 2020-07-11 – 2020-07-15 (×5): 30 mg via ORAL
  Filled 2020-07-11 (×5): qty 3

## 2020-07-11 MED ORDER — SULFAMETHOXAZOLE-TRIMETHOPRIM 400-80 MG/5ML IV SOLN
416.0000 mg | Freq: Two times a day (BID) | INTRAVENOUS | Status: DC
  Administered 2020-07-11 – 2020-07-16 (×10): 416 mg via INTRAVENOUS
  Filled 2020-07-11 (×14): qty 26

## 2020-07-11 MED ORDER — ONDANSETRON HCL 4 MG/2ML IJ SOLN: 4.0000 mg | Freq: Four times a day (QID) | INTRAMUSCULAR | Status: DC | PRN

## 2020-07-11 MED ORDER — HYDRALAZINE HCL 20 MG/ML IJ SOLN: 10.0000 mg | Freq: Four times a day (QID) | INTRAMUSCULAR | Status: DC | PRN

## 2020-07-11 MED ORDER — DOCUSATE SODIUM 100 MG PO CAPS
100.0000 mg | ORAL_CAPSULE | Freq: Two times a day (BID) | ORAL | Status: DC
  Administered 2020-07-11 – 2020-07-16 (×10): 100 mg via ORAL
  Filled 2020-07-11 (×10): qty 1

## 2020-07-11 MED ORDER — DOCUSATE SODIUM 100 MG PO CAPS: 100.0000 mg | ORAL_CAPSULE | Freq: Two times a day (BID) | ORAL | Status: DC

## 2020-07-11 MED ORDER — SENNOSIDES-DOCUSATE SODIUM 8.6-50 MG PO TABS
1.0000 | ORAL_TABLET | Freq: Every evening | ORAL | Status: DC | PRN
  Administered 2020-07-12: 1 via ORAL
  Filled 2020-07-11: qty 1

## 2020-07-11 MED ORDER — PANTOPRAZOLE SODIUM 40 MG PO TBEC
40.0000 mg | DELAYED_RELEASE_TABLET | Freq: Every day | ORAL | Status: DC
  Administered 2020-07-11 – 2020-07-16 (×4): 40 mg via ORAL
  Filled 2020-07-11 (×5): qty 1

## 2020-07-11 MED ORDER — ACETAMINOPHEN 650 MG RE SUPP: 650.0000 mg | Freq: Four times a day (QID) | RECTAL | Status: DC | PRN

## 2020-07-11 MED ORDER — ACETAMINOPHEN 325 MG PO TABS
650.0000 mg | ORAL_TABLET | Freq: Four times a day (QID) | ORAL | Status: DC | PRN
  Administered 2020-07-11: 650 mg via ORAL
  Filled 2020-07-11: qty 2

## 2020-07-11 NOTE — Progress Notes (Addendum)
Pharmacy Antibiotic Note  Kelsey Rodriguez is a 29 y.o. female admitted on 07/11/2020 with worsening symptoms of CNS toxoplasmosis. Pharmacy has been consulted for IV Sulfamethoxazole-Trimethoprim dosing.  Plan:  Sulfamethoxazole-Trimethoprim 416mg  (5mg /kg based on Trimethoprim component) IV q12h  Monitor renal function, K+, cultures, clinical course, ID recommendations  Height: 5\' 5"  (165.1 cm) Weight: 83 kg (182 lb 15.7 oz) IBW/kg (Calculated) : 57  Temp (24hrs), Avg:98.8 F (37.1 C), Min:98.4 F (36.9 C), Max:99.2 F (37.3 C)  No results for input(s): WBC, CREATININE, LATICACIDVEN, VANCOTROUGH, VANCOPEAK, VANCORANDOM, GENTTROUGH, GENTPEAK, GENTRANDOM, TOBRATROUGH, TOBRAPEAK, TOBRARND, AMIKACINPEAK, AMIKACINTROU, AMIKACIN in the last 168 hours.  Estimated Creatinine Clearance: 108.4 mL/min (by C-G formula based on SCr of 0.74 mg/dL).    Allergies  Allergen Reactions  . Hydrocodone Itching and Nausea Only    Tolerates Oxycodone  . Tramadol Itching and Nausea Only    Tolerates oxycodone    Antimicrobials this admission: 7/28 Sulfamethoxazole-Trimethoprim >>  Dose adjustments this admission: --  Microbiology results: 7/28 UCx: ordered    Thank you for allowing pharmacy to be a part of this patient's care.   Lindell Spar, PharmD, BCPS Clinical Pharmacist  07/11/2020 7:11 PM

## 2020-07-11 NOTE — H&P (Signed)
History and Physical    Kelsey Rodriguez ZSW:[id number] DOB: 08/16/1991 DOA: 07/11/2020  PCP: Gerlene Fee, DO   Patient coming from: Infectious Disease Dr. Graylon Good.  I have personally briefly reviewed patient's old medical records in [place]  Chief Complaint: Intermittent headache, fever and blurring of vision.  HPI: Kelsey Rodriguez is a 29 y.o. female with medical history significant for poorly controlled HIV disease, CD 4 count of 107/ VL 201,000  (May 2021) with CNS toxoplasmosis.  Patient reports intermittent headache fever and blurring of vision for some time.  Patient is not taking any medication for HIV.  She reports worsening headache associated with fever of 102 and blurring of vision two weeks ago. She went to the emergency room there was a long wait to be seen so she left the emergency room without being seen and went home, She has been managing  her headache with naproxen and Tylenol.  She reports pain gets better.  she went to see Dr. Graylon Good today and was told to get hospitalized for IV antibiotics.  She was advised to be admitted for re-initiating of antibiotics plus brain biopsy however she did not elect to stay hospitalized. She did follow through with brain lesion biopsy in early June with the pathology c/w CNS toxo and ruled out lymphoma. She has been taking bactrim DS 1 tab BID but states she is not back on her HIV regimen - with tivicay-symtuza- for unclear reasons. She also reports history of seizure disorder, has not taken antiseizure medications in the last 2 months. She reports symptoms are worsening with  double/blurry vision.   ED Course: Patient is a direct admit from Dr. Crissie Figures office. All the blood work has been ordered results pending,  Vitals:  Temperature 99.2, pulse 97, RR 18 BP 132 /89. oxygen saturation 99% on room air.  Review of Systems: As per HPI otherwise 10 point review of systems negative.  Review of Systems  Constitutional:  Positive for chills and fever.  Eyes: Positive for blurred vision.  Respiratory: Negative.   Cardiovascular: Negative.   Gastrointestinal: Negative.   Genitourinary: Negative.   Musculoskeletal: Negative.   Neurological: Positive for headaches.  Psychiatric/Behavioral: Negative.     Past Medical History:  Diagnosis Date  . 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 Adrenal insufficiency ([place])   . Anemia of chronic disease 03/11/2012  . Back pain of lumbar region with sciatica 02/12/2015  . Bell's palsy 08/26/2013  . Brain lesion   . 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 Chronic back pain   . Chronic leg pain    bilateral knees, ankles  . Depression   . Fatigue   . GERD (gastroesophageal reflux disease)   . Headache   . 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 HIV (human immunodeficiency virus infection) ([place]) 02/2012  . Laceration of ankle, right 11/18/2012  . Lumbar radiculopathy   . Meningitis 02/18/2018  . Pelvic pain   . PID (acute pelvic inflammatory disease) 02/26/2018  . Pneumonia   . Reflux esophagitis 03/11/2012  . Seizure ([place])   . Tuberculosis   . 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 Vertigo   . Wears glasses     Past Surgical History:  Procedure Laterality Date  . APPENDECTOMY  ~ 2000  . APPLICATION OF CRANIAL NAVIGATION N/A 05/16/2020   Procedure: APPLICATION OF CRANIAL NAVIGATION;  Surgeon: Newman Pies, MD;  Location: [facility];  Service: Neurosurgery;  Laterality: N/A;  . CRANIOTOMY Right 05/16/2020   Procedure: Craniotomy  for Resection of Lesion;  Surgeon: Newman Pies, MD;  Location: [facility];  Service: Neurosurgery;  Laterality: Right;  right  . DILATION AND CURETTAGE OF UTERUS  2008  . ESOPHAGOGASTRODUODENOSCOPY  03/11/2012   Procedure: ESOPHAGOGASTRODUODENOSCOPY (EGD);  Surgeon: Lafayette Dragon, MD;  Location: [facility];  Service: Endoscopy;  Laterality: N/A;  . ESOPHAGOGASTRODUODENOSCOPY N/A 03/07/2014   Procedure: ESOPHAGOGASTRODUODENOSCOPY (EGD);  Surgeon:  Gatha Mayer, MD;  [facility];  Service: Endoscopy;  Laterality: N/A;  . LUNG BIOPSY  02/2012  . TOTAL HIP ARTHROPLASTY Left 12/14/2015   Procedure: LEFT TOTAL HIP ARTHROPLASTY ANTERIOR APPROACH;  Surgeon: Mcarthur Rossetti, MD;  Location: [facility];  Service: Orthopedics;  Laterality: Left;  . TOTAL HIP ARTHROPLASTY Right 04/04/2016   Procedure: RIGHT TOTAL HIP ARTHROPLASTY ANTERIOR APPROACH;  Surgeon: Mcarthur Rossetti, MD;  Location: [facility];  Service: Orthopedics;  Laterality: Right;     reports that she has never smoked. She has never used smokeless tobacco. She reports previous alcohol use. She reports that she does not use drugs.  Allergies  Allergen Reactions  . Hydrocodone Itching and Nausea Only    Tolerates Oxycodone  . Tramadol Itching and Nausea Only    Tolerates oxycodone    Family History  Problem Relation Age of Onset  . Heart disease Father        Vague not clearly cardiac  . Hypertension Mother     Family history reviewed and not pertinent.  Prior to Admission medications   Medication Sig Start Date End Date Taking? Authorizing Provider  amitriptyline (ELAVIL) 10 MG tablet Take 3 tablets (30 mg total) by mouth at bedtime. 06/28/20  Yes Ward Givens, NP  docusate sodium (COLACE) 100 MG capsule Take 1 capsule (100 mg total) by mouth 2 (two) times daily. Patient taking differently: Take 100 mg by mouth 2 (two) times daily as needed for mild constipation.  05/17/20  Yes Newman Pies, MD  levETIRAcetam (KEPPRA) 500 MG tablet Take 1 tablet (500 mg total) by mouth 2 (two) times daily. Patient taking differently: Take 500 mg by mouth daily as needed (seizures).  03/22/20  Yes Carlyle Basques, MD  polyethylene glycol (MIRALAX / GLYCOLAX) 17 g packet Take 17 g by mouth daily as needed for moderate constipation.   Yes [provider]  sulfamethoxazole-trimethoprim (BACTRIM DS) 800-160 MG tablet Take 1 tablet by mouth 2 (two) times daily. 03/22/20   Yes Carlyle Basques, MD  Darunavir-Cobicisctat-Emtricitabine-Tenofovir Alafenamide [facility]) 800-150-200-10 MG TABS Take 1 tablet by mouth daily with breakfast. Patient not taking: Reported on 07/05/2020 03/22/20   Carlyle Basques, MD  dolutegravir (TIVICAY) 50 MG tablet Take 1 tablet (50 mg total) by mouth daily. Patient not taking: Reported on 07/05/2020 03/22/20   Carlyle Basques, MD  SUMAtriptan (IMITREX) 50 MG tablet Take 1 tablet (50 mg total) by mouth every 2 (two) hours as needed for migraine (Maximum dose: 100 mg per dose; 200 mg per 24 hours). May repeat in 2 hours if headache persists or recurs. 10/26/19 01/31/20  Caroline More, DO    Physical Exam: Vitals:   07/11/20 1720  BP: (!) 132/89  Pulse: 97  Resp: 18  Temp: 99.2 F (37.3 C)  TempSrc: Oral  SpO2: 99%  Weight: 83 kg  Height: 5\' 5"  (1.651 m)    Constitutional: NAD, calm, comfortable Vitals:   07/11/20 1720  BP: (!) 132/89  Pulse: 97  Resp: 18  Temp: 99.2 F (37.3 C)  TempSrc: Oral  SpO2: 99%  Weight:  83 kg  Height: 5\' 5"  (1.651 m)   Eyes: PERRL, lids and conjunctivae normal ENMT: Mucous membranes are moist. Posterior pharynx clear of any exudate or lesions.Normal dentition.  Neck: normal, supple, no masses, no thyromegaly Respiratory: clear to auscultation bilaterally, no wheezing, no crackles. Normal respiratory effort. No accessory muscle use.  Cardiovascular: Regular rate and rhythm, no murmurs / rubs / gallops. No extremity edema. 2+ pedal pulses. No carotid bruits.  Abdomen: no tenderness, no masses palpated. No hepatosplenomegaly. Bowel sounds positive.  Musculoskeletal: no clubbing / cyanosis. No joint deformity upper and lower extremities. Good ROM, no contractures. Normal muscle tone.  Skin: no rashes, lesions, ulcers. No induration Neurologic: CN 2-12 grossly intact. Sensation intact, DTR normal. Strength 5/5 in all 4.  Psychiatric: Normal judgment and insight. Alert and oriented x 3. Normal mood.     Labs on Admission: I have personally reviewed following labs and imaging studies  CBC: No results for input(s): WBC, NEUTROABS, HGB, HCT, MCV, PLT in the last 168 hours. Basic Metabolic Panel: No results for input(s): NA, K, CL, CO2, GLUCOSE, BUN, CREATININE, CALCIUM, MG, PHOS in the last 168 hours. GFR: Estimated Creatinine Clearance: 108.4 mL/min (by C-G formula based on SCr of 0.74 mg/dL). Liver Function Tests: No results for input(s): AST, ALT, ALKPHOS, BILITOT, PROT, ALBUMIN in the last 168 hours. No results for input(s): LIPASE, AMYLASE in the last 168 hours. No results for input(s): AMMONIA in the last 168 hours. Coagulation Profile: No results for input(s): INR, PROTIME in the last 168 hours. Cardiac Enzymes: No results for input(s): CKTOTAL, CKMB, CKMBINDEX, TROPONINI in the last 168 hours. BNP (last 3 results) No results for input(s): PROBNP in the last 8760 hours. HbA1C: No results for input(s): HGBA1C in the last 72 hours. CBG: No results for input(s): GLUCAP in the last 168 hours. Lipid Profile: No results for input(s): CHOL, HDL, LDLCALC, TRIG, CHOLHDL, LDLDIRECT in the last 72 hours. Thyroid Function Tests: No results for input(s): TSH, T4TOTAL, FREET4, T3FREE, THYROIDAB in the last 72 hours. Anemia Panel: No results for input(s): VITAMINB12, FOLATE, FERRITIN, TIBC, IRON, RETICCTPCT in the last 72 hours. Urine analysis:    Component Value Date/Time   COLORURINE YELLOW 06/25/2020 2115   APPEARANCEUR CLOUDY (A) 06/25/2020 2115   LABSPEC 1.020 06/25/2020 2115   PHURINE 6.0 06/25/2020 2115   GLUCOSEU NEGATIVE 06/25/2020 2115   HGBUR NEGATIVE 06/25/2020 2115   BILIRUBINUR NEGATIVE 06/25/2020 2115   BILIRUBINUR negative 12/31/2018 1550   KETONESUR NEGATIVE 06/25/2020 2115   PROTEINUR NEGATIVE 06/25/2020 2115   UROBILINOGEN 0.2 12/31/2018 1550   UROBILINOGEN 0.2 09/14/2015 0829   NITRITE NEGATIVE 06/25/2020 2115   LEUKOCYTESUR SMALL (A) 06/25/2020 2115     Radiological Exams on Admission: No results found.  EKG: Ordered not completed.  Assessment/Plan Principal Problem:   Headache Active Problems:   HIV (human immunodeficiency virus infection) (HCC)   Primary adrenal insufficiency (HCC)   Toxoplasmosis   Seizure (HCC)   Headache associated with infection   Intermittent headache, fever and blurring of vision: Patient has diagnosed toxoplasmosis.  Direct admit for IV antibiotics. Infectious diseases on the board. Pharmacy consulted for IV Bactrim and other medications Tylenol and ibuprofen as needed for pain control.  Recommended by ID. Pyrimethamine 200 mg X 1 Then 75 mg daily Leucovorin 30 mg daily Sulfadiazine 1500 mg 4 times a day   HIV: Poorly controlled: CD4 count 107, Viral Load 210000. Consider restarting antiretroviral therapy.  Seizure disorder: Patient reports not taking antiseizure medication  for last 2 months. We will start [place].   DVT prophylaxis: SCDs Code Status: Full code Family Communication: Husband was at bedside explained in detail Disposition Plan: Anticipated discharge home in 1 to 2 days Consults called: Infectious disease Dr. Graylon Good Admission status: Inpatient   Shawna Clamp MD [facility]   If 7PM-7AM, please contact night-coverage [email or web address]   07/11/2020, 6:15 PM

## 2020-07-11 NOTE — Telephone Encounter (Signed)
Called patient for update. She is leaving home now to go to [facility].

## 2020-07-11 NOTE — Telephone Encounter (Signed)
Patient is being admitted to [facility] after clinic visit per Dr Baxter Flattery.  Requested bed at [facility], Dr Tana Coast admitting.  Bed will be ready for patient by 3:30.  Notified patient who is currently waiting at home. She will have her husband bring her to [facility] when he comes home at 3:30.  Landis Gandy, RN

## 2020-07-11 NOTE — Progress Notes (Signed)
Patient ID: Kelsey Rodriguez, female   DOB: 06/23/1991, 29 y.o.   MRN: [id number]  HPI 29yo F, with poorly controlled hiv disease, CD 4 count of 107/VL 201,000 ( May 2021) who also has CNS toxoplasmosis. She was to be admitted for re-initiating of antibiotics plus brain biopsy however she did not elect to stay hospitalized. She did follow through with brain lesion biopsy in early June with the path c/w CNS toxo and ruled out lymphoma. She has been taking bactrim DS 1 tab BID but states she is not back on her HIV regimen - with tivicay-symtuza- for unclear reasons  She states that she feels her symptoms are worsening with having double/blurry vision. Ongoing headaches in the evening. She did go to eye doctor evaluation where her vision is quite poor but suspect it maybe due to CNS toxo as the cause.  2 weeks ago had fevers, headache, double vision, went to the ED however, the wait was very long and she decided to go home.  She is amenable to going to back to hospital to start on therapy for CNS toxo    Outpatient Encounter Medications as of 07/11/2020  Medication Sig  . amitriptyline (ELAVIL) 10 MG tablet Take 3 tablets (30 mg total) by mouth at bedtime.  . docusate sodium (COLACE) 100 MG capsule Take 1 capsule (100 mg total) by mouth 2 (two) times daily.  [name] sulfamethoxazole-trimethoprim (BACTRIM DS) 800-160 MG tablet Take 1 tablet by mouth 2 (two) times daily.  . Darunavir-Cobicisctat-Emtricitabine-Tenofovir Alafenamide (SYMTUZA) 800-150-200-10 MG TABS Take 1 tablet by mouth daily with breakfast. (Patient not taking: Reported on 07/05/2020)  . dolutegravir (TIVICAY) 50 MG tablet Take 1 tablet (50 mg total) by mouth daily. (Patient not taking: Reported on 07/05/2020)  . levETIRAcetam (KEPPRA) 500 MG tablet Take 1 tablet (500 mg total) by mouth 2 (two) times daily. (Patient not taking: Reported on 07/11/2020)  . pantoprazole (PROTONIX) 40 MG tablet Take 1 tablet (40 mg total) by mouth  daily.  . [DISCONTINUED] SUMAtriptan (IMITREX) 50 MG tablet Take 1 tablet (50 mg total) by mouth every 2 (two) hours as needed for migraine (Maximum dose: 100 mg per dose; 200 mg per 24 hours). May repeat in 2 hours if headache persists or recurs.   No facility-administered encounter medications on file as of 07/11/2020.     Patient Active Problem List   Diagnosis Date Noted  . S/P craniotomy 05/16/2020  . Brain tumor ([place]) 05/16/2020  . 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 Hypertension   . Pain of upper abdomen   . Suicide ideation   . 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 Seizure ([place])   . Intracranial mass   . Intractable headache 02/04/2020  . Encephalitis, myelitis, 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[place]) 01/31/2020  . Rotator cuff strain 01/26/2020  . 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 AIDS (acquired immune deficiency syndrome) ([place]) 11/07/2019  . Cerebral edema ([place]) 10/28/2019  . Midline shift of brain   . Pruritus 08/29/2019  . Tendinopathy of left shoulder 01/18/2019  . Chronic pelvic pain in female 01/04/2019  . Lower abdominal pain 06/21/2018  . Syphilis 02/26/2018  . Tuberculosis   . Infertility, female 02/12/2018  . 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 Acute right-sided low back pain with right-sided sciatica 08/24/2017  . Complex regional pain syndrome 02/03/2017  . Headache 10/28/2016  . Avascular necrosis of bone of right hip ([place]) 04/04/2016  .  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 Avascular necrosis of bone of left hip ([place]) 12/14/2015  . 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 Nausea 05/18/2015  . Vertigo 01/23/2015  . Primary adrenal insufficiency ([place]) 01/03/2015  . Chest pain 07/07/2013  . HIV (human immunodeficiency virus infection) ([place]) 03/16/2012  . 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   Review of Systems  Physical Exam   BP 118/83   Pulse 94   Temp 98.4 F (36.9 C)   Wt 182 lb (82.6 kg)   LMP 06/21/2020   SpO2 99%   BMI 31.24 kg/m   Physical Exam  Constitutional:  oriented to person, place, and time. appears well-developed and well-nourished. No distress.  HENT: [place]/AT, PERRLA, no scleral icterus Mouth/Throat: Oropharynx is clear and moist. No oropharyngeal exudate.  Cardiovascular: Normal rate, regular rhythm and normal heart sounds. Exam reveals no gallop and no friction rub.  No murmur heard.  Pulmonary/Chest: Effort normal and breath sounds normal. No respiratory distress.  has no wheezes.  Neck = supple, no nuchal rigidity Abdominal: Soft. Bowel sounds are normal.  exhibits no distension. There is no tenderness.  Lymphadenopathy: no cervical adenopathy. No axillary adenopathy Neurological: alert and oriented to person, place, and time. Difficulty with CF exercise. No visual field cut Skin: Skin is warm and dry. No rash noted. No erythema.  Psychiatric: a normal mood and affect.  behavior is normal.   Lab Results  Component Value Date   CD4TCELL 6 (L) 05/10/2020   Lab Results  Component Value Date   CD4TABS 107 (L) 05/10/2020   CD4TABS 45 (L) 03/07/2020   CD4TABS 36 (L) 11/18/2019   Lab Results  Component Value Date   HIV1RNAQUANT 201,000 05/10/2020   Lab Results  Component Value Date   HEPBSAB POS (A) 09/13/2015   Lab Results  Component Value Date   LABRPR Reactive (A) 10/29/2019    CBC Lab Results  Component Value Date   WBC 5.0 06/25/2020   RBC 4.06 06/25/2020   HGB 11.7 (L) 06/25/2020   HCT 36.4 06/25/2020   PLT 213 06/25/2020   MCV 89.7 06/25/2020   MCH 28.8 06/25/2020   MCHC 32.1 06/25/2020   RDW 17.0 (H) 06/25/2020   LYMPHSABS 1.4 06/25/2020   MONOABS 0.4 06/25/2020   EOSABS 0.2 06/25/2020    BMET Lab Results  Component Value Date   NA 137 06/25/2020   K 4.2 06/25/2020   CL 105 06/25/2020    CO2 23 06/25/2020   GLUCOSE 96 06/25/2020   BUN 7 06/25/2020   CREATININE 0.74 06/25/2020   CALCIUM 9.0 06/25/2020   GFRNONAA >60 06/25/2020   GFRAA >60 06/25/2020      Assessment and Plan CNS toxoplasmosis = would like to admit her to [facility] for worsening symptoms of CNS toxo and starting IV treatment and assess that she tolerates medication.   Can start on IV bactrim then ask pharmacy to order medications for:   Pyrimethamine 200 mg X 1 Then 75 mg daily Leucovorin 30 mg daily Sulfadiazine 1500 mg 4 times a day  HIV disease = recommend recheck labs. Restart on home medications  oi proph = continue on bactrim ds daily  Spent 34min discussing management and coordination into the hospital

## 2020-07-12 DIAGNOSIS — B589 Toxoplasmosis, unspecified: Secondary | ICD-10-CM

## 2020-07-12 DIAGNOSIS — R569 Unspecified convulsions: Secondary | ICD-10-CM

## 2020-07-12 DIAGNOSIS — B20 Human immunodeficiency virus [HIV] disease: Secondary | ICD-10-CM | POA: Diagnosis not present

## 2020-07-12 DIAGNOSIS — R519 Headache, unspecified: Secondary | ICD-10-CM | POA: Diagnosis not present

## 2020-07-12 DIAGNOSIS — E2740 Unspecified adrenocortical insufficiency: Secondary | ICD-10-CM | POA: Diagnosis not present

## 2020-07-12 LAB — COMPREHENSIVE METABOLIC PANEL
ALT: 15 U/L (ref 0–44)
AST: 16 U/L (ref 15–41)
Albumin: 3.8 g/dL (ref 3.5–5.0)
Alkaline Phosphatase: 89 U/L (ref 38–126)
Anion gap: 9 (ref 5–15)
BUN: 7 mg/dL (ref 6–20)
CO2: 22 mmol/L (ref 22–32)
Calcium: 8.8 mg/dL — ABNORMAL LOW (ref 8.9–10.3)
Chloride: 108 mmol/L (ref 98–111)
Creatinine, Ser: 0.66 mg/dL (ref 0.44–1.00)
GFR calc Af Amer: >60 mL/min (ref >60)
GFR calc non Af Amer: >60 mL/min (ref >60)
Glucose, Bld: 89 mg/dL (ref 70–99)
Potassium: 4 mmol/L (ref 3.5–5.1)
Sodium: 139 mmol/L (ref 135–145)
Total Bilirubin: 0.5 mg/dL (ref 0.3–1.2)
Total Protein: 6.9 g/dL (ref 6.5–8.1)

## 2020-07-12 LAB — URINE CULTURE: Culture: <10000 — AB

## 2020-07-12 LAB — CBC
HCT: 35.3 % — ABNORMAL LOW (ref 36.0–46.0)
Hemoglobin: 11.6 g/dL — ABNORMAL LOW (ref 12.0–15.0)
MCH: 30.1 pg (ref 26.0–34.0)
MCHC: 32.9 g/dL (ref 30.0–36.0)
MCV: 91.5 fL (ref 80.0–100.0)
Platelets: 211 10*3/uL (ref 150–400)
RBC: 3.86 MIL/uL — ABNORMAL LOW (ref 3.87–5.11)
RDW: 16.9 % — ABNORMAL HIGH (ref 11.5–15.5)
WBC: 2.7 10*3/uL — ABNORMAL LOW (ref 4.0–10.5)
nRBC: 0 % (ref 0.0–0.2)

## 2020-07-12 MED ORDER — PYRIMETHAMINE 25 MG PO TABS: ORAL_TABLET | ORAL | 0 refills | Status: DC

## 2020-07-12 MED ORDER — DARUN-COBIC-EMTRICIT-TENOFAF 800-150-200-10 MG PO TABS
1.0000 | ORAL_TABLET | Freq: Every day | ORAL | Status: DC
  Administered 2020-07-13 – 2020-07-16 (×4): 1 via ORAL
  Filled 2020-07-12 (×4): qty 1

## 2020-07-12 MED ORDER — LEUCOVORIN CALCIUM 15 MG PO TABS
15.0000 mg | ORAL_TABLET | Freq: Every day | ORAL | 0 refills | Status: DC
Start: 2020-07-12 — End: 2020-07-16

## 2020-07-12 MED ORDER — DOLUTEGRAVIR SODIUM 50 MG PO TABS
50.0000 mg | ORAL_TABLET | Freq: Every day | ORAL | Status: DC
  Administered 2020-07-12 – 2020-07-16 (×5): 50 mg via ORAL
  Filled 2020-07-12 (×5): qty 1

## 2020-07-12 MED ORDER — SULFADIAZINE 500 MG PO TABS
1500.0000 mg | ORAL_TABLET | Freq: Four times a day (QID) | ORAL | 0 refills | Status: DC
Start: 2020-07-12 — End: 2020-07-16

## 2020-07-12 MED FILL — PYRIMETHAMINE 25 MG TABS: 25 | 30 days supply | Qty: 95 | Fill #0

## 2020-07-12 MED FILL — LEUCOVORIN CALCIUM 15 MG TA: 15 | 24 days supply | Qty: 24 | Fill #0

## 2020-07-12 MED FILL — sulfADIAZINE 500 MG TABS: 500 | 10 days supply | Qty: 120 | Fill #0

## 2020-07-12 NOTE — Progress Notes (Signed)
PROGRESS NOTE    Kelsey Rodriguez  HCW:[id number] DOB: 08-08-1991 DOA: 07/11/2020 PCP: Gerlene Fee, DO   Brief Narrative: HPI: Kelsey Rodriguez is a 29 y.o. female with medical history significant for poorly controlled HIV disease, CD 4 count of 107/ VL 201,000  (May 2021) with CNS toxoplasmosis.  Patient reports intermittent headache fever and blurring of vision for some time.  Patient is not taking any medication for HIV.  She reports worsening headache associated with fever of 102 and blurring of vision two weeks ago. She went to the emergency room there was a long wait to be seen so she left the emergency room without being seen and went home, She has been managing  her headache with naproxen and Tylenol.  She reports pain gets better.  she went to see Dr. Graylon Good today and was told to get hospitalized for IV antibiotics.  She was advised to be admitted for re-initiating of antibiotics plus brain biopsy however she did not elect to stay hospitalized. She did follow through with brain lesion biopsy in early June with the pathology c/w CNS toxo and ruled out lymphoma. She has been taking bactrim DS 1 tab BID but states she is not back on her HIV regimen - with tivicay-symtuza- for unclear reasons. She also reports history of seizure disorder, has not taken antiseizure medications in the last 2 months. She reports symptoms are worsening with  double/blurry vision.   Assessment & Plan:   Principal Problem:   Toxoplasmosis Active Problems:   HIV (human immunodeficiency virus infection) ([place])   Primary adrenal insufficiency (HCC)   Seizure ([place])   Headache associated with infection  Intermittent headache, fever and blurring of vision: Patient has diagnosed toxoplasmosis.  Direct admit for IV antibiotics. Infectious diseases on the board. Pharmacy consulted for IV Bactrim and other medications Tylenol and ibuprofen as needed for pain control.   HIV: Poorly controlled: CD4  count 107, Viral Load 210000. Consider restarting antiretroviral therapy.  Seizure disorder: Patient reports not taking antiseizure medication for last 2 months. Consider start [place].   DVT prophylaxis: SCDs Code Status: Full code Family Communication: Husband was at bedside explained in detail Disposition Plan: Anticipated discharge home in 1 to 2 days. Consults called: Infectious disease Dr. Bridget Hartshorn. Patient is not medically clear due to ongoing treatment for toxoplasmosis.   Procedures:  None. Antimicrobials: Anti-infectives (From admission, onward)   Start     Dose/Rate Route Frequency Ordered Stop   07/13/20 0800  Darunavir-Cobicisctat-Emtricitabine-Tenofovir Alafenamide (SYMTUZA) 800-150-200-10 MG TABS 1 tablet     Discontinue     1 tablet Oral Daily with breakfast 07/12/20 1207     07/12/20 1300  dolutegravir (TIVICAY) tablet 50 mg     Discontinue     50 mg Oral Daily 07/12/20 1207     07/12/20 0000  pyrimethamine (DARAPRIM) 25 MG tablet     Discontinue        07/12/20 0816     07/12/20 0000  sulfaDIAZINE 500 MG tablet     Discontinue     1,500 mg Oral 4 times daily 07/12/20 0816     07/11/20 2200  sulfamethoxazole-trimethoprim (BACTRIM DS) 800-160 MG per tablet 1 tablet  Status:  Discontinued        1 tablet Oral 2 times daily 07/11/20 1814 07/11/20 1908   07/11/20 2000  sulfamethoxazole-trimethoprim (BACTRIM) 416 mg in dextrose 5 % 500 mL IVPB     Discontinue     416 mg 350.7 mL/hr over  90 Minutes Intravenous Every 12 hours 07/11/20 1908        Subjective: Patient was seen and examined at bedside.  No overnight events.  She reports headache and blurring of vision has improved.  Objective: Vitals:   07/11/20 1720 07/11/20 2104 07/12/20 0553  BP: (!) 132/89 (!) 126/88 108/68  Pulse: 97 91 77  Resp: 18 14 14   Temp: 99.2 F (37.3 C) 98 F (36.7 C) 97.9 F (36.6 C)  TempSrc: Oral Oral Oral  SpO2: 99% 100% 100%  Weight: 83 kg    Height: 5\' 5"  (1.651 m)       Intake/Output Summary (Last 24 hours) at 07/12/2020 1453 Last data filed at 07/12/2020 1200 Gross per 24 hour  Intake 2024.78 ml  Output 200 ml  Net 1824.78 ml   Filed Weights   07/11/20 1720  Weight: 83 kg    Examination:  General exam: Appears calm and comfortable  Respiratory system: Clear to auscultation. Respiratory effort normal. Cardiovascular system: S1 & S2 heard, RRR. No JVD, murmurs, rubs, gallops or clicks. No pedal edema. Gastrointestinal system: Abdomen is nondistended, soft and nontender. No organomegaly or masses felt. Normal bowel sounds heard. Central nervous system: Alert and oriented. No focal neurological deficits. Extremities: Symmetric 5 x 5 power. Skin: No rashes, lesions or ulcers Psychiatry: Judgement and insight appear normal. Mood & affect appropriate.     Data Reviewed: I have personally reviewed following labs and imaging studies  CBC: Recent Labs  Lab 07/11/20 2021 07/12/20 0541  WBC 3.4* 2.7*  NEUTROABS 1.6*  --   HGB 12.4 11.6*  HCT 37.7 35.3*  MCV 91.3 91.5  PLT 234 010   Basic Metabolic Panel: Recent Labs  Lab 07/11/20 2021 07/12/20 0541  NA 141 139  K 3.9 4.0  CL 105 108  CO2 23 22  GLUCOSE 87 89  BUN 9 7  CREATININE 0.78 0.66  CALCIUM 9.9 8.8*  MG 2.1  --   PHOS 3.4  --    GFR: Estimated Creatinine Clearance: 108.4 mL/min (by C-G formula based on SCr of 0.66 mg/dL). Liver Function Tests: Recent Labs  Lab 07/11/20 2021 07/12/20 0541  AST 20 16  ALT 16 15  ALKPHOS 102 89  BILITOT 0.4 0.5  PROT 7.9 6.9  ALBUMIN 4.7 3.8   No results for input(s): LIPASE, AMYLASE in the last 168 hours. No results for input(s): AMMONIA in the last 168 hours. Coagulation Profile: No results for input(s): INR, PROTIME in the last 168 hours. Cardiac Enzymes: No results for input(s): CKTOTAL, CKMB, CKMBINDEX, TROPONINI in the last 168 hours. BNP (last 3 results) No results for input(s): PROBNP in the last 8760  hours. HbA1C: No results for input(s): HGBA1C in the last 72 hours. CBG: No results for input(s): GLUCAP in the last 168 hours. Lipid Profile: No results for input(s): CHOL, HDL, LDLCALC, TRIG, CHOLHDL, LDLDIRECT in the last 72 hours. Thyroid Function Tests: Recent Labs    07/11/20 2021  TSH 1.385   Anemia Panel: No results for input(s): VITAMINB12, FOLATE, FERRITIN, TIBC, IRON, RETICCTPCT in the last 72 hours. Sepsis Labs: No results for input(s): PROCALCITON, LATICACIDVEN in the last 168 hours.  No results found for this or any previous visit (from the past 240 hour(s)).    Radiology Studies: X-ray chest PA and lateral  Result Date: 07/11/2020 CLINICAL DATA:  Vision change and headache EXAM: CHEST - 2 VIEW COMPARISON:  02/13/2020 FINDINGS: The heart size and mediastinal contours are within normal limits.  Both lungs are clear. The visualized skeletal structures are unremarkable. IMPRESSION: No active cardiopulmonary disease. Electronically Signed   By: Donavan Foil M.D.   On: 07/11/2020 20:06     Scheduled Meds: . amitriptyline  30 mg Oral QHS  . [START ON 07/13/2020] Darunavir-Cobicisctat-Emtricitabine-Tenofovir Alafenamide  1 tablet Oral Q breakfast  . docusate sodium  100 mg Oral BID  . dolutegravir  50 mg Oral Daily  . pantoprazole  40 mg Oral Daily   Continuous Infusions: . sodium chloride 100 mL/hr at 07/11/20 2038  . sulfamethoxazole-trimethoprim 416 mg (07/12/20 0848)     LOS: 1 day    Time spent: 25 mins.    Shawna Clamp, MD [facility]   If 7PM-7AM, please contact night-coverage

## 2020-07-12 NOTE — Progress Notes (Signed)
Patient ID: Kelsey Rodriguez, female   DOB: 07-Nov-1991, 29 y.o.   MRN: [id number]         [facility] for Infectious Disease  Date of Admission:  07/11/2020           Day 1 IV Trimethoprim/Sulfamethoxazole ASSESSMENT: Recurrent scans and hospital admissions we will do nothing for her long-term unless she decides to take her medication.  I suspect depression is playing a major role in her ability to be fully adherent.  She was started on an SSRI in February when she was hospitalized with toxoplasmosis, depression and suicidal ideations but I suspect that she did not take that either.  PLAN: 1. Continue IV Trimethoprim/Sulfamethoxazole 2. Restart Tivicay and Symtuza  Principal Problem:   Toxoplasmosis Active Problems:   HIV (human immunodeficiency virus infection) ([place])   Primary adrenal insufficiency (HCC)   Seizure ([place])   Headache associated with infection   Scheduled Meds: . amitriptyline  30 mg Oral QHS  . [START ON 07/13/2020] Darunavir-Cobicisctat-Emtricitabine-Tenofovir Alafenamide  1 tablet Oral Q breakfast  . docusate sodium  100 mg Oral BID  . dolutegravir  50 mg Oral Daily  . pantoprazole  40 mg Oral Daily   Continuous Infusions: . sodium chloride 100 mL/hr at 07/11/20 2038  . sulfamethoxazole-trimethoprim 416 mg (07/12/20 0848)   PRN Meds:.acetaminophen **OR** acetaminophen, hydrALAZINE, ondansetron **OR** ondansetron (ZOFRAN) IV, senna-docusate   SUBJECTIVE: Kelsey Rodriguez has longstanding, poorly controlled HIV infection complicated by CNS toxoplasmosis that was first diagnosed last November with CT and MRI scans that showed a rim-enhancing right temporal lobe mass.  She is Toxoplasma IgG positive.  She has been hospitalized on 4 separate occasions for treatment and evaluation.  She has had multiple follow-up scans that have only shown slight improvement.  Her HIV viral load has not been suppressed and she has not had any CD4 reconstitution.  She underwent a  brain biopsy on 05/16/2020 which showed "inflamed normal tissue with multi nucleated giant cells and abundant necrosis" compatible with toxoplasma abscess.    She is supposed to be taking Tivicay and Symtuza but tells me that she does not take it.  She cannot give me a reason why.  She says that she does not have any side effects from those medications.  She is also supposed to be taking 1 double strength Trimethoprim/Sulfamethoxazole tablet twice daily.  She frequently does not take it because she says it makes her stomach hurt.  Review of Systems: Review of Systems  Constitutional: Negative for fever and weight loss.  Eyes: Positive for double vision.  Gastrointestinal: Negative for abdominal pain, diarrhea, nausea and vomiting.  Neurological: Positive for headaches.    Allergies  Allergen Reactions  . Hydrocodone Itching and Nausea Only    Tolerates Oxycodone  . Tramadol Itching and Nausea Only    Tolerates oxycodone    OBJECTIVE: Vitals:   07/11/20 1720 07/11/20 2104 07/12/20 0553  BP: (!) 132/89 (!) 126/88 108/68  Pulse: 97 91 77  Resp: 18 14 14   Temp: 99.2 F (37.3 C) 98 F (36.7 C) 97.9 F (36.6 C)  TempSrc: Oral Oral Oral  SpO2: 99% 100% 100%  Weight: 83 kg    Height: 5\' 5"  (1.651 m)     Body mass index is 30.45 kg/m.  Physical Exam Constitutional:      Comments: She is resting quietly in bed.  She does not seem to be in any distress.  Cardiovascular:     Rate and Rhythm: Normal rate  and regular rhythm.     Heart sounds: No murmur heard.   Pulmonary:     Effort: Pulmonary effort is normal.     Breath sounds: Normal breath sounds.  Abdominal:     Palpations: Abdomen is soft.     Tenderness: There is no abdominal tenderness.  Musculoskeletal:     Cervical back: Neck supple.  Psychiatric:     Comments: Flat affect.     Lab Results Lab Results  Component Value Date   WBC 2.7 (L) 07/12/2020   HGB 11.6 (L) 07/12/2020   HCT 35.3 (L) 07/12/2020   MCV 91.5  07/12/2020   PLT 211 07/12/2020    Lab Results  Component Value Date   CREATININE 0.66 07/12/2020   BUN 7 07/12/2020   NA 139 07/12/2020   K 4.0 07/12/2020   CL 108 07/12/2020   CO2 22 07/12/2020    Lab Results  Component Value Date   ALT 15 07/12/2020   AST 16 07/12/2020   ALKPHOS 89 07/12/2020   BILITOT 0.5 07/12/2020     Microbiology: No results found for this or any previous visit (from the past 240 hour(s)).  Michel Bickers, MD [facility] [place] [facility] [phone] pager   [phone] cell 07/12/2020, 2:01 PM

## 2020-07-12 NOTE — Progress Notes (Signed)
Pt reports she hasn't had a headache overnight. Neuro checks stable. Continue to monitor. Hortencia Conradi RN

## 2020-07-13 ENCOUNTER — Ambulatory Visit: Payer: Medicaid Other | Admitting: Internal Medicine

## 2020-07-13 DIAGNOSIS — B589 Toxoplasmosis, unspecified: Secondary | ICD-10-CM | POA: Diagnosis not present

## 2020-07-13 DIAGNOSIS — R519 Headache, unspecified: Secondary | ICD-10-CM | POA: Diagnosis not present

## 2020-07-13 DIAGNOSIS — E2740 Unspecified adrenocortical insufficiency: Secondary | ICD-10-CM | POA: Diagnosis not present

## 2020-07-13 DIAGNOSIS — R569 Unspecified convulsions: Secondary | ICD-10-CM | POA: Diagnosis not present

## 2020-07-13 DIAGNOSIS — B20 Human immunodeficiency virus [HIV] disease: Secondary | ICD-10-CM | POA: Diagnosis not present

## 2020-07-13 MED ORDER — POLYETHYLENE GLYCOL 3350 17 G PO PACK
17.0000 g | PACK | Freq: Every day | ORAL | Status: DC | PRN
  Administered 2020-07-13 – 2020-07-14 (×2): 17 g via ORAL
  Filled 2020-07-13: qty 1

## 2020-07-13 NOTE — Progress Notes (Addendum)
PROGRESS NOTE    Kelsey Rodriguez  HEN:[id number] DOB: [birth year] DOA: 07/11/2020 PCP: Gerlene Fee, DO   Brief Narrative: HPI: Kelsey Rodriguez is a 29 y.o. female with medical history significant for poorly controlled HIV disease, CD 4 count of 107/ VL 201,000  (May 2021) with CNS toxoplasmosis.  Patient reports intermittent headache fever and blurring of vision for some time.  Patient is not taking any medication for HIV.  She reports worsening headache associated with fever of 102 and blurring of vision two weeks ago. She went to the emergency room there was a long wait to be seen so she left the emergency room without being seen and went home, She has been managing  her headache with naproxen and Tylenol.  She reports pain gets better.  she went to see Dr. Graylon Good today and was told to get hospitalized for IV antibiotics.  She was advised to be admitted for re-initiating of antibiotics plus brain biopsy however she did not elect to stay hospitalized. She did follow through with brain lesion biopsy in early June with the pathology c/w CNS toxo and ruled out lymphoma. She has been taking bactrim DS 1 tab BID but states she is not back on her HIV regimen - with tivicay-symtuza- for unclear reasons. She also reports history of seizure disorder, has not taken antiseizure medications in the last 2 months. She is admitted for poorly controlled HIV with CNS toxoplasmosis.  ID is on board.  Assessment & Plan:   Principal Problem:   Toxoplasmosis Active Problems:   HIV (human immunodeficiency virus infection) ([place])   Primary adrenal insufficiency (HCC)   Seizure ([place])   Headache associated with infection  Intermittent headache, fever and blurring of vision: Patient has diagnosed toxoplasmosis.  Direct admit for IV antibiotics. Infectious diseases on the board. Pharmacy consulted for IV Bactrim and other medications Tylenol and ibuprofen as needed for pain control.   HIV:  Poorly controlled: CD4 count 107, Viral Load 210000. Continue antiretroviral therapy.  Seizure disorder: Patient reports not taking antiseizure medication for last 2 months. Consider start [place].   DVT prophylaxis: SCDs Code Status: Full code Family Communication: Husband was at bedside explained in detail Disposition Plan: Anticipated discharge home in 1 to 2 days. Consults called: Infectious disease Dr. Bridget Hartshorn. Patient is not medically clear due to ongoing treatment for toxoplasmosis.   Procedures:  None. Antimicrobials: Anti-infectives (From admission, onward)   Start     Dose/Rate Route Frequency Ordered Stop   07/13/20 0800  Darunavir-Cobicisctat-Emtricitabine-Tenofovir Alafenamide (SYMTUZA) 800-150-200-10 MG TABS 1 tablet     Discontinue     1 tablet Oral Daily with breakfast 07/12/20 1207     07/12/20 1300  dolutegravir (TIVICAY) tablet 50 mg     Discontinue     50 mg Oral Daily 07/12/20 1207     07/12/20 0000  pyrimethamine (DARAPRIM) 25 MG tablet     Discontinue        07/12/20 0816     07/12/20 0000  sulfaDIAZINE 500 MG tablet     Discontinue     1,500 mg Oral 4 times daily 07/12/20 0816     07/11/20 2200  sulfamethoxazole-trimethoprim (BACTRIM DS) 800-160 MG per tablet 1 tablet  Status:  Discontinued        1 tablet Oral 2 times daily 07/11/20 1814 07/11/20 1908   07/11/20 2000  sulfamethoxazole-trimethoprim (BACTRIM) 416 mg in dextrose 5 % 500 mL IVPB     Discontinue     416  mg 350.7 mL/hr over 90 Minutes Intravenous Every 12 hours 07/11/20 1908        Subjective: Patient was seen and examined at bedside.  No overnight events.  She reports headache and blurring of vision has improved.  She also when she can be discharged home.  Objective: Vitals:   07/12/20 1520 07/12/20 2057 07/13/20 0542 07/13/20 1324  BP: (!) 113/89 (!) 124/91 122/82 114/82  Pulse: 83 86 79 95  Resp: 16 16 18 20   Temp: 98.5 F (36.9 C) 99.4 F (37.4 C) (!) 97.5 F (36.4 C) 98.2 F  (36.8 C)  TempSrc: Oral Oral Oral Oral  SpO2: 100% 100% 100% 96%  Weight:      Height:        Intake/Output Summary (Last 24 hours) at 07/13/2020 1451 Last data filed at 07/13/2020 0600 Gross per 24 hour  Intake 1617.12 ml  Output --  Net 1617.12 ml   Filed Weights   07/11/20 1720  Weight: 83 kg    Examination:  General exam: Appears calm and comfortable  Respiratory system: Clear to auscultation. Respiratory effort normal. Cardiovascular system: S1 & S2 heard, RRR. No JVD, murmurs, rubs, gallops or clicks. No pedal edema. Gastrointestinal system: Abdomen is nondistended, soft and nontender. No organomegaly or masses felt. Normal bowel sounds heard. Central nervous system: Alert and oriented. No focal neurological deficits. Extremities: Symmetric 5 x 5 power. Skin: No rashes, lesions or ulcers Psychiatry: Judgement and insight appear normal. Mood & affect appropriate.     Data Reviewed: I have personally reviewed following labs and imaging studies  CBC: Recent Labs  Lab 07/11/20 2021 07/12/20 0541  WBC 3.4* 2.7*  NEUTROABS 1.6*  --   HGB 12.4 11.6*  HCT 37.7 35.3*  MCV 91.3 91.5  PLT 234 096   Basic Metabolic Panel: Recent Labs  Lab 07/11/20 2021 07/12/20 0541  NA 141 139  K 3.9 4.0  CL 105 108  CO2 23 22  GLUCOSE 87 89  BUN 9 7  CREATININE 0.78 0.66  CALCIUM 9.9 8.8*  MG 2.1  --   PHOS 3.4  --    GFR: Estimated Creatinine Clearance: 108.4 mL/min (by C-G formula based on SCr of 0.66 mg/dL). Liver Function Tests: Recent Labs  Lab 07/11/20 2021 07/12/20 0541  AST 20 16  ALT 16 15  ALKPHOS 102 89  BILITOT 0.4 0.5  PROT 7.9 6.9  ALBUMIN 4.7 3.8   No results for input(s): LIPASE, AMYLASE in the last 168 hours. No results for input(s): AMMONIA in the last 168 hours. Coagulation Profile: No results for input(s): INR, PROTIME in the last 168 hours. Cardiac Enzymes: No results for input(s): CKTOTAL, CKMB, CKMBINDEX, TROPONINI in the last 168  hours. BNP (last 3 results) No results for input(s): PROBNP in the last 8760 hours. HbA1C: No results for input(s): HGBA1C in the last 72 hours. CBG: No results for input(s): GLUCAP in the last 168 hours. Lipid Profile: No results for input(s): CHOL, HDL, LDLCALC, TRIG, CHOLHDL, LDLDIRECT in the last 72 hours. Thyroid Function Tests: Recent Labs    07/11/20 2021  TSH 1.385   Anemia Panel: No results for input(s): VITAMINB12, FOLATE, FERRITIN, TIBC, IRON, RETICCTPCT in the last 72 hours. Sepsis Labs: No results for input(s): PROCALCITON, LATICACIDVEN in the last 168 hours.  Recent Results (from the past 240 hour(s))  Urine culture     Status: Abnormal   Collection Time: 07/11/20  8:40 PM   Specimen: Urine, Random  Result Value  Ref Range Status   Specimen Description   Final    URINE, RANDOM Performed at [facility] [address], [place], [place] [address]    Special Requests   Final    NONE Performed at [facility], [place] [address], [place], [place] [address]    Culture (A)  Final    <10,000 COLONIES/mL INSIGNIFICANT GROWTH Performed at [facility] [address], [place], [place] [address]    Report Status 07/12/2020 FINAL  Final      Radiology Studies: X-ray chest PA and lateral  Result Date: 07/11/2020 CLINICAL DATA:  Vision change and headache EXAM: CHEST - 2 VIEW COMPARISON:  02/13/2020 FINDINGS: The heart size and mediastinal contours are within normal limits. Both lungs are clear. The visualized skeletal structures are unremarkable. IMPRESSION: No active cardiopulmonary disease. Electronically Signed   By: Donavan Foil M.D.   On: 07/11/2020 20:06     Scheduled Meds: . amitriptyline  30 mg Oral QHS  . Darunavir-Cobicisctat-Emtricitabine-Tenofovir Alafenamide  1 tablet Oral Q breakfast  . docusate sodium  100 mg Oral BID  . dolutegravir  50 mg Oral Daily  . pantoprazole  40 mg Oral Daily   Continuous  Infusions: . sodium chloride Stopped (07/12/20 2318)  . sulfamethoxazole-trimethoprim 416 mg (07/13/20 1332)     LOS: 2 days    Time spent: 25 mins.    Shawna Clamp, MD [facility]   If 7PM-7AM, please contact night-coverage

## 2020-07-13 NOTE — Progress Notes (Signed)
Patient ID: Kelsey Rodriguez, female   DOB: 02/19/1991, 29 y.o.   MRN: [id number]         [facility]  Date of Admission:  07/11/2020           Day 2 IV Trimethoprim/Sulfamethoxazole ASSESSMENT: She has CNS toxoplasmosis complicating advanced, undertreated HIV infection.  All of this is due to chronic problems with adherence.  She does seem more willing to be more diligent.  I recommend continuing current therapy for the next few days then we can get her back in the clinic when she can meet with our ID pharmacist and behavioral health counselor to review her medications.  We will need to review past resistance assays to see if there are smaller and effective alternatives to [place].  PLAN: 1. Continue IV Trimethoprim/Sulfamethoxazole 2. Continue Tivicay and Symtuza 3. Please call Dr. Carlyle Basques [phone]) for any infectious disease questions this weekend  Principal Problem:   Toxoplasmosis Active Problems:   HIV (human immunodeficiency virus infection) ([place])   Primary adrenal insufficiency ([place])   Seizure ([place])   Headache associated with infection   Scheduled Meds: . amitriptyline  30 mg Oral QHS  . Darunavir-Cobicisctat-Emtricitabine-Tenofovir Alafenamide  1 tablet Oral Q breakfast  . docusate sodium  100 mg Oral BID  . dolutegravir  50 mg Oral Daily  . pantoprazole  40 mg Oral Daily   Continuous Infusions: . sodium chloride Stopped (07/12/20 2318)  . sulfamethoxazole-trimethoprim 416 mg (07/13/20 1332)   PRN Meds:.acetaminophen **OR** acetaminophen, hydrALAZINE, ondansetron **OR** ondansetron (ZOFRAN) IV, polyethylene glycol, senna-docusate   SUBJECTIVE: She is feeling better today.  She denies any headache or double vision.  She said she did think about her problems taking her medication after our discussion yesterday.  When I ask her what she thought about she said "I just have to take the medication".  She also talked to her husband.   She said he was concerned and wanted her to take the medication.  She asked if there is any pills smaller than the Symtuza.  She also continued to say that the oral trimethoprim sulfamethoxazole can sometimes hurt her stomach.  Review of Systems: Review of Systems  Constitutional: Negative for fever and weight loss.  Eyes: Negative for double vision.  Gastrointestinal: Negative for abdominal pain, diarrhea, nausea and vomiting.  Neurological: Negative for headaches.    Allergies  Allergen Reactions  . Hydrocodone Itching and Nausea Only    Tolerates Oxycodone  . Tramadol Itching and Nausea Only    Tolerates oxycodone    OBJECTIVE: Vitals:   07/12/20 1520 07/12/20 2057 07/13/20 0542 07/13/20 1324  BP: (!) 113/89 (!) 124/91 122/82 114/82  Pulse: 83 86 79 95  Resp: 16 16 18 20   Temp: 98.5 F (36.9 C) 99.4 F (37.4 C) (!) 97.5 F (36.4 C) 98.2 F (36.8 C)  TempSrc: Oral Oral Oral Oral  SpO2: 100% 100% 100% 96%  Weight:      Height:       Body mass index is 30.45 kg/m.  Physical Exam Constitutional:      Comments: She is resting quietly in bed.  She is more talkative today.  Cardiovascular:     Rate and Rhythm: Normal rate and regular rhythm.     Heart sounds: No murmur heard.   Pulmonary:     Effort: Pulmonary effort is normal.     Breath sounds: Normal breath sounds.  Abdominal:     Palpations: Abdomen is soft.  Tenderness: There is no abdominal tenderness.  Musculoskeletal:     Cervical back: Neck supple.  Psychiatric:        Mood and Affect: Mood normal.     Lab Results Lab Results  Component Value Date   WBC 2.7 (L) 07/12/2020   HGB 11.6 (L) 07/12/2020   HCT 35.3 (L) 07/12/2020   MCV 91.5 07/12/2020   PLT 211 07/12/2020    Lab Results  Component Value Date   CREATININE 0.66 07/12/2020   BUN 7 07/12/2020   NA 139 07/12/2020   K 4.0 07/12/2020   CL 108 07/12/2020   CO2 22 07/12/2020    Lab Results  Component Value Date   ALT 15 07/12/2020    AST 16 07/12/2020   ALKPHOS 89 07/12/2020   BILITOT 0.5 07/12/2020     Microbiology: Recent Results (from the past 240 hour(s))  Urine culture     Status: Abnormal   Collection Time: 07/11/20  8:40 PM   Specimen: Urine, Random  Result Value Ref Range Status   Specimen Description   Final    URINE, RANDOM Performed at [facility], [place] [address], [place], [place] [address]    Special Requests   Final    NONE Performed at [facility], [place] [address] [place], [place] [address]    Culture (A)  Final    <10,000 COLONIES/mL INSIGNIFICANT GROWTH Performed at [place] [address] [place], [place] [address]    Report Status 07/12/2020 FINAL  Final    Michel Bickers, MD [facility] [place] [facility] [phone] pager   [phone] cell 07/13/2020, 2:34 PM

## 2020-07-13 NOTE — TOC Initial Note (Signed)
Transition of Care [facility]) - Initial/Assessment Note    Patient Details  Name: Kelsey Rodriguez MRN: [id number] Date of Birth: [birth year]  Transition of [facility]) CM/SW Contact:    Leeroy Cha, RN Phone Number: 07/13/2020, 8:34 AM  Clinical Narrative:                 s- toxoplasmosis and urine culture +,Iv ns at 100cc/hr, Iv bactrim WBC 2.7. P-home with husband,has pcp, will follow for progression and toc needs.  Expected Discharge Plan: Home/Self Care Barriers to Discharge: Continued Medical Work up   Patient Goals and CMS Choice Patient states their goals for this hospitalization and ongoing recovery are:: to go home asap CMS Medicare.gov Compare Post Acute Care list provided to:: Patient    Expected Discharge Plan and Services Expected Discharge Plan: Home/Self Care   Discharge Planning Services: CM Consult   Living arrangements for the past 2 months: Single Family Home                                      Prior Living Arrangements/Services Living arrangements for the past 2 months: Single Family Home Lives with:: Spouse Patient language and need for interpreter reviewed:: Yes Do you feel safe going back to the place where you live?: Yes      Need for Family Participation in Patient Care: Yes (Comment) Care giver support system in place?: Yes (comment)   Criminal Activity/Legal Involvement Pertinent to Current Situation/Hospitalization: No - Comment as needed  Activities of Daily Living Home Assistive Devices/Equipment: Eyeglasses ADL Screening (condition at time of admission) Patient's cognitive ability adequate to safely complete daily activities?: Yes Is the patient deaf or have difficulty hearing?: No Does the patient have difficulty seeing, even when wearing glasses/contacts?: Yes (double vision with headaches at times and flashes of light) Does the patient have difficulty concentrating, remembering, or making decisions?: No Patient able to  express need for assistance with ADLs?: Yes Does the patient have difficulty dressing or bathing?: No Independently performs ADLs?: Yes (appropriate for developmental age) Does the patient have difficulty walking or climbing stairs?: No Weakness of Legs: None Weakness of Arms/Hands: None  Permission Sought/Granted                  Emotional Assessment Appearance:: Appears stated age Attitude/Demeanor/Rapport: Engaged Affect (typically observed): Calm Orientation: : Oriented to Place, Oriented to Self, Oriented to  Time, Oriented to Situation Alcohol / Substance Use: Not Applicable Psych Involvement: No (comment)  Admission diagnosis:  Headache associated with infection [B99.9, R51.9] Patient Active Problem List   Diagnosis Date Noted  . Headache associated with infection 07/11/2020  . S/P craniotomy 05/16/2020  . Brain tumor ([place]) 05/16/2020  . 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 Hypertension   . Pain of upper abdomen   . Suicide ideation   . 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 Seizure ([place])   . Intracranial mass   . Intractable headache 02/04/2020  . Encephalitis, myelitis, 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[place]) 01/31/2020  . Rotator cuff strain 01/26/2020  . 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 AIDS (acquired immune deficiency syndrome) ([place]) 11/07/2019  . Cerebral edema ([place]) 10/28/2019  . Midline shift of brain   . Pruritus 08/29/2019  . Tendinopathy of left shoulder 01/18/2019  . Chronic pelvic pain in female 01/04/2019  . Lower abdominal pain 06/21/2018  . Syphilis  02/26/2018  . Tuberculosis   . Infertility, female 02/12/2018  . 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 Acute right-sided low back pain with right-sided sciatica 08/24/2017  . Complex regional pain syndrome 02/03/2017  . Headache 10/28/2016  . Avascular necrosis of bone of right hip ([place]) 04/04/2016  . Status post total  replacement of right hip 04/04/2016  . Avascular necrosis of bone of left hip ([place]) 12/14/2015  . 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 Nausea 05/18/2015  . Vertigo 01/23/2015  . Primary adrenal insufficiency ([place]) 01/03/2015  . Chest pain 07/07/2013  . HIV (human immunodeficiency virus infection) ([place]) 03/16/2012  . 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 [place] [address] Phone: [phone] Fax: [phone]  [place], [place] - [place] [address] Phone: [phone] Fax: [phone]     Social Determinants of Health ([place]) Interventions    Readmission Risk Interventions Readmission Risk Prevention Plan 11/08/2019  Transportation Screening Complete  PCP or Specialist Appt within 5-7 Days Complete  Home Care Screening Complete  Medication Review (RN CM) Complete  Some recent data might be hidden

## 2020-07-14 NOTE — Progress Notes (Signed)
Pharmacy Antibiotic Note  Kelsey Rodriguez is a 29 y.o. female admitted on 07/11/2020 with worsening symptoms of CNS toxoplasmosis. Pharmacy has been consulted for IV Sulfamethoxazole-Trimethoprim dosing.  07/14/20 11:24 AM   K 4  SCr < 1, stable   Plan:  Continue sulfamethoxazole-Trimethoprim 416mg  (5mg /kg based on Trimethoprim component) IV q12h  Monitor renal function, K+, cultures, clinical course, ID recommendations  Height: 5\' 5"  (165.1 cm) Weight: 83 kg (182 lb 15.7 oz) IBW/kg (Calculated) : 57  Temp (24hrs), Avg:98.2 F (36.8 C), Min:98 F (36.7 C), Max:98.5 F (36.9 C)  Recent Labs  Lab 07/11/20 2021 07/12/20 0541  WBC 3.4* 2.7*  CREATININE 0.78 0.66    Estimated Creatinine Clearance: 108.4 mL/min (by C-G formula based on SCr of 0.66 mg/dL).    Allergies  Allergen Reactions  . Hydrocodone Itching and Nausea Only    Tolerates Oxycodone  . Tramadol Itching and Nausea Only    Tolerates oxycodone    Antimicrobials this admission: 7/28 Sulfamethoxazole-Trimethoprim >>  Dose adjustments this admission: --  Microbiology results: 7/28 UCx: insig grwth   Thank you for allowing pharmacy to be a part of this patient's care.   Ulice Dash, PharmD, BCPS 07/14/2020 11:24 AM

## 2020-07-14 NOTE — Progress Notes (Signed)
PROGRESS NOTE    Kelsey Rodriguez  WPY:[id number] DOB: [birth year] DOA: 07/11/2020 PCP: Gerlene Fee, DO   Brief Narrative: HPI: Kelsey Rodriguez is a 29 y.o. female with medical history significant for poorly controlled HIV disease, CD 4 count of 107/ VL 201,000  (May 2021) with CNS toxoplasmosis.  Patient reports intermittent headache fever and blurring of vision for some time.  Patient is not taking any medication for HIV.  She reports worsening headache associated with fever of 102 and blurring of vision two weeks ago. She went to the emergency room there was a long wait to be seen so she left the emergency room without being seen and went home, She has been managing  her headache with naproxen and Tylenol.  She reports pain gets better.  she went to see Dr. Graylon Good today and was told to get hospitalized for IV antibiotics.  She was advised to be admitted for re-initiating of antibiotics plus brain biopsy however she did not elect to stay hospitalized. She did follow through with brain lesion biopsy in early June with the pathology c/w CNS toxo and ruled out lymphoma. She has been taking bactrim DS 1 tab BID but states she is not back on her HIV regimen - with tivicay-symtuza- for unclear reasons. She also reports history of seizure disorder, has not taken antiseizure medications in the last 2 months. She is admitted for poorly controlled HIV with CNS toxoplasmosis.  ID is on board.  Assessment & Plan:   Principal Problem:   Toxoplasmosis Active Problems:   HIV (human immunodeficiency virus infection) ([place])   Primary adrenal insufficiency ([facility])   Seizure ([place])   Headache associated with infection  Intermittent headache, fever and blurring of vision: Patient has diagnosed toxoplasmosis.  Direct admit for IV antibiotics. Infectious diseases on the board. Pharmacy consulted for IV Bactrim and other medications Tylenol and ibuprofen as needed for pain control.   HIV:  Poorly controlled: CD4 count 107, Viral Load 210000. Continue antiretroviral therapy.  Seizure disorder: Patient reports not taking antiseizure medication for last 2 months. Consider start [employer].   DVT prophylaxis: SCDs Code Status: Full code Family Communication: Husband was at bedside explained in detail Disposition Plan: Anticipated discharge home in 1 to 2 days. Consults called: Infectious disease Dr. Bridget Hartshorn. Patient is not medically clear due to ongoing treatment for toxoplasmosis.   Procedures:  None. Antimicrobials: Anti-infectives (From admission, onward)   Start     Dose/Rate Route Frequency Ordered Stop   07/13/20 0800  Darunavir-Cobicisctat-Emtricitabine-Tenofovir Alafenamide (SYMTUZA) 800-150-200-10 MG TABS 1 tablet     Discontinue     1 tablet Oral Daily with breakfast 07/12/20 1207     07/12/20 1300  dolutegravir (TIVICAY) tablet 50 mg     Discontinue     50 mg Oral Daily 07/12/20 1207     07/12/20 0000  pyrimethamine (DARAPRIM) 25 MG tablet     Discontinue        07/12/20 0816     07/12/20 0000  sulfaDIAZINE 500 MG tablet     Discontinue     1,500 mg Oral 4 times daily 07/12/20 0816     07/11/20 2200  sulfamethoxazole-trimethoprim (BACTRIM DS) 800-160 MG per tablet 1 tablet  Status:  Discontinued        1 tablet Oral 2 times daily 07/11/20 1814 07/11/20 1908   07/11/20 2000  sulfamethoxazole-trimethoprim (BACTRIM) 416 mg in dextrose 5 % 500 mL IVPB     Discontinue     416  mg 350.7 mL/hr over 90 Minutes Intravenous Every 12 hours 07/11/20 1908        Subjective: Patient was seen and examined at bedside.  No overnight events.  She reports headache and blurring of vision has improved.   Objective: Vitals:   07/13/20 1324 07/13/20 2252 07/14/20 0616 07/14/20 1314  BP: 114/82 107/81 108/74 113/79  Pulse: 95 84 79 88  Resp: 20 16 17 16   Temp: 98.2 F (36.8 C) 98.5 F (36.9 C) 98 F (36.7 C) 98.4 F (36.9 C)  TempSrc: Oral Oral Oral Oral  SpO2: 96% 100%  100% 100%  Weight:      Height:        Intake/Output Summary (Last 24 hours) at 07/14/2020 1448 Last data filed at 07/14/2020 0600 Gross per 24 hour  Intake 1056.78 ml  Output --  Net 1056.78 ml   Filed Weights   07/11/20 1720  Weight: 83 kg    Examination:  General exam: Appears calm and comfortable  Respiratory system: Clear to auscultation. Respiratory effort normal. Cardiovascular system: S1 & S2 heard, RRR. No JVD, murmurs, rubs, gallops or clicks. No pedal edema. Gastrointestinal system: Abdomen is nondistended, soft and nontender. No organomegaly or masses felt. Normal bowel sounds heard. Central nervous system: Alert and oriented. No focal neurological deficits. Extremities: Symmetric 5 x 5 power. Skin: No rashes, lesions or ulcers Psychiatry: Judgement and insight appear normal. Mood & affect appropriate.     Data Reviewed: I have personally reviewed following labs and imaging studies  CBC: Recent Labs  Lab 07/11/20 2021 07/12/20 0541  WBC 3.4* 2.7*  NEUTROABS 1.6*  --   HGB 12.4 11.6*  HCT 37.7 35.3*  MCV 91.3 91.5  PLT 234 914   Basic Metabolic Panel: Recent Labs  Lab 07/11/20 2021 07/12/20 0541  NA 141 139  K 3.9 4.0  CL 105 108  CO2 23 22  GLUCOSE 87 89  BUN 9 7  CREATININE 0.78 0.66  CALCIUM 9.9 8.8*  MG 2.1  --   PHOS 3.4  --    GFR: Estimated Creatinine Clearance: 108.4 mL/min (by C-G formula based on SCr of 0.66 mg/dL). Liver Function Tests: Recent Labs  Lab 07/11/20 2021 07/12/20 0541  AST 20 16  ALT 16 15  ALKPHOS 102 89  BILITOT 0.4 0.5  PROT 7.9 6.9  ALBUMIN 4.7 3.8   No results for input(s): LIPASE, AMYLASE in the last 168 hours. No results for input(s): AMMONIA in the last 168 hours. Coagulation Profile: No results for input(s): INR, PROTIME in the last 168 hours. Cardiac Enzymes: No results for input(s): CKTOTAL, CKMB, CKMBINDEX, TROPONINI in the last 168 hours. BNP (last 3 results) No results for input(s): PROBNP  in the last 8760 hours. HbA1C: No results for input(s): HGBA1C in the last 72 hours. CBG: No results for input(s): GLUCAP in the last 168 hours. Lipid Profile: No results for input(s): CHOL, HDL, LDLCALC, TRIG, CHOLHDL, LDLDIRECT in the last 72 hours. Thyroid Function Tests: Recent Labs    07/11/20 2021  TSH 1.385   Anemia Panel: No results for input(s): VITAMINB12, FOLATE, FERRITIN, TIBC, IRON, RETICCTPCT in the last 72 hours. Sepsis Labs: No results for input(s): PROCALCITON, LATICACIDVEN in the last 168 hours.  Recent Results (from the past 240 hour(s))  Urine culture     Status: Abnormal   Collection Time: 07/11/20  8:40 PM   Specimen: Urine, Random  Result Value Ref Range Status   Specimen Description   Final  URINE, RANDOM Performed at [facility], [place] [address], [place], [place] [address]    Special Requests   Final    NONE Performed at [facility], [place] [address] [place], [place] [address]    Culture (A)  Final    <10,000 COLONIES/mL INSIGNIFICANT GROWTH Performed at [place] [address] [place],  [address]    Report Status 07/12/2020 FINAL  Final      Radiology Studies: No results found.   Scheduled Meds: . amitriptyline  30 mg Oral QHS  . Darunavir-Cobicisctat-Emtricitabine-Tenofovir Alafenamide  1 tablet Oral Q breakfast  . docusate sodium  100 mg Oral BID  . dolutegravir  50 mg Oral Daily  . pantoprazole  40 mg Oral Daily   Continuous Infusions: . sodium chloride Stopped (07/13/20 2004)  . sulfamethoxazole-trimethoprim 416 mg (07/14/20 0900)     LOS: 3 days    Time spent: 25 mins.    Shawna Clamp, MD [facility]   If 7PM-7AM, please contact night-coverage

## 2020-07-15 DIAGNOSIS — Z419 Encounter for procedure for purposes other than remedying health state, unspecified: Secondary | ICD-10-CM | POA: Diagnosis not present

## 2020-07-15 LAB — CBC
HCT: 33.9 % — ABNORMAL LOW (ref 36.0–46.0)
Hemoglobin: 11.3 g/dL — ABNORMAL LOW (ref 12.0–15.0)
MCH: 30.1 pg (ref 26.0–34.0)
MCHC: 33.3 g/dL (ref 30.0–36.0)
MCV: 90.2 fL (ref 80.0–100.0)
Platelets: 211 10*3/uL (ref 150–400)
RBC: 3.76 MIL/uL — ABNORMAL LOW (ref 3.87–5.11)
RDW: 17.2 % — ABNORMAL HIGH (ref 11.5–15.5)
WBC: 2.9 10*3/uL — ABNORMAL LOW (ref 4.0–10.5)
nRBC: 0 % (ref 0.0–0.2)

## 2020-07-15 NOTE — Progress Notes (Signed)
PROGRESS NOTE    Khady Vandenberg Bolin  LPF:[id number] DOB: [birth year] DOA: 07/11/2020 PCP: Gerlene Fee, DO   Brief Narrative: HPI: Kelsey Rodriguez is a 29 y.o. female with medical history significant for poorly controlled HIV disease, CD 4 count of 107/ VL 201,000  (May 2021) with CNS toxoplasmosis.  Patient reports intermittent headache fever and blurring of vision for some time.  Patient is not taking any medication for HIV.  She reports worsening headache associated with fever of 102 and blurring of vision two weeks ago. She went to the emergency room there was a long wait to be seen so she left the emergency room without being seen and went home, She has been managing  her headache with naproxen and Tylenol.  She reports pain gets better.  she went to see Dr. Graylon Good today and was told to get hospitalized for IV antibiotics.  She was advised to be admitted for re-initiating of antibiotics plus brain biopsy however she did not elect to stay hospitalized. She did follow through with brain lesion biopsy in early June with the pathology c/w CNS toxo and ruled out lymphoma. She has been taking bactrim DS 1 tab BID but states she is not back on her HIV regimen - with tivicay-symtuza- for unclear reasons. She also reports history of seizure disorder, has not taken antiseizure medications in the last 2 months. She is admitted for poorly controlled HIV with CNS toxoplasmosis.  ID is on board.  Assessment & Plan:   Principal Problem:   Toxoplasmosis Active Problems:   HIV (human immunodeficiency virus infection) ([place])   Primary adrenal insufficiency ([facility])   Seizure ([place])   Headache associated with infection  Intermittent headache, fever and blurring of vision: >>> Resolved. Patient has diagnosed toxoplasmosis.  Direct admit for IV antibiotics. Infectious diseases on the board. Pharmacy consulted for IV Bactrim and other medications Continue IV Bactrim for now Tylenol and ibuprofen as  needed for pain control.   HIV: Poorly controlled: CD4 count 107, Viral Load 210000. Continue antiretroviral therapy.  Seizure disorder: Patient reports not taking antiseizure medication for last 2 months.   DVT prophylaxis: SCDs Code Status: Full code Family Communication: Husband was at bedside explained in detail Disposition Plan: Anticipated discharge home in 1 to 2 days. Consults called: Infectious disease Dr. Bridget Hartshorn. Patient is not medically clear due to ongoing treatment for toxoplasmosis.   Procedures:  None. Antimicrobials: Anti-infectives (From admission, onward)   Start     Dose/Rate Route Frequency Ordered Stop   07/13/20 0800  Darunavir-Cobicisctat-Emtricitabine-Tenofovir Alafenamide (SYMTUZA) 800-150-200-10 MG TABS 1 tablet     Discontinue     1 tablet Oral Daily with breakfast 07/12/20 1207     07/12/20 1300  dolutegravir (TIVICAY) tablet 50 mg     Discontinue     50 mg Oral Daily 07/12/20 1207     07/12/20 0000  pyrimethamine (DARAPRIM) 25 MG tablet     Discontinue        07/12/20 0816     07/12/20 0000  sulfaDIAZINE 500 MG tablet     Discontinue     1,500 mg Oral 4 times daily 07/12/20 0816     07/11/20 2200  sulfamethoxazole-trimethoprim (BACTRIM DS) 800-160 MG per tablet 1 tablet  Status:  Discontinued        1 tablet Oral 2 times daily 07/11/20 1814 07/11/20 1908   07/11/20 2000  sulfamethoxazole-trimethoprim (BACTRIM) 416 mg in dextrose 5 % 500 mL IVPB     Discontinue  416 mg 350.7 mL/hr over 90 Minutes Intravenous Every 12 hours 07/11/20 1908        Subjective: Patient was seen and examined at bedside.  No overnight events.  She reports headache and blurring of vision has improved.   She wants to be discharged home tomorrow. Objective: Vitals:   07/14/20 1314 07/14/20 2123 07/15/20 0529 07/15/20 1401  BP: 113/79 119/84 107/73 110/78  Pulse: 88 93 79 85  Resp: 16 16 15 18   Temp: 98.4 F (36.9 C) 98.7 F (37.1 C) 98.2 F (36.8 C) 98.5 F  (36.9 C)  TempSrc: Oral Oral Oral Oral  SpO2: 100% 98% 100% 100%  Weight:      Height:        Intake/Output Summary (Last 24 hours) at 07/15/2020 1458 Last data filed at 07/15/2020 1300 Gross per 24 hour  Intake 1896.91 ml  Output --  Net 1896.91 ml   Filed Weights   07/11/20 1720  Weight: 83 kg    Examination:  General exam: Appears calm and comfortable  Respiratory system: Clear to auscultation. Respiratory effort normal. Cardiovascular system: S1 & S2 heard, RRR. No JVD, murmurs, rubs, gallops or clicks. No pedal edema. Gastrointestinal system: Abdomen is nondistended, soft and nontender. No organomegaly or masses felt. Normal bowel sounds heard. Central nervous system: Alert and oriented. No focal neurological deficits. Extremities: Symmetric 5 x 5 power. Skin: No rashes, lesions or ulcers Psychiatry: Judgement and insight appear normal. Mood & affect appropriate.     Data Reviewed: I have personally reviewed following labs and imaging studies  CBC: Recent Labs  Lab 07/11/20 2021 07/12/20 0541 07/15/20 0633  WBC 3.4* 2.7* 2.9*  NEUTROABS 1.6*  --   --   HGB 12.4 11.6* 11.3*  HCT 37.7 35.3* 33.9*  MCV 91.3 91.5 90.2  PLT 234 211 619   Basic Metabolic Panel: Recent Labs  Lab 07/11/20 2021 07/12/20 0541  NA 141 139  K 3.9 4.0  CL 105 108  CO2 23 22  GLUCOSE 87 89  BUN 9 7  CREATININE 0.78 0.66  CALCIUM 9.9 8.8*  MG 2.1  --   PHOS 3.4  --    GFR: Estimated Creatinine Clearance: 108.4 mL/min (by C-G formula based on SCr of 0.66 mg/dL). Liver Function Tests: Recent Labs  Lab 07/11/20 2021 07/12/20 0541  AST 20 16  ALT 16 15  ALKPHOS 102 89  BILITOT 0.4 0.5  PROT 7.9 6.9  ALBUMIN 4.7 3.8   No results for input(s): LIPASE, AMYLASE in the last 168 hours. No results for input(s): AMMONIA in the last 168 hours. Coagulation Profile: No results for input(s): INR, PROTIME in the last 168 hours. Cardiac Enzymes: No results for input(s): CKTOTAL,  CKMB, CKMBINDEX, TROPONINI in the last 168 hours. BNP (last 3 results) No results for input(s): PROBNP in the last 8760 hours. HbA1C: No results for input(s): HGBA1C in the last 72 hours. CBG: No results for input(s): GLUCAP in the last 168 hours. Lipid Profile: No results for input(s): CHOL, HDL, LDLCALC, TRIG, CHOLHDL, LDLDIRECT in the last 72 hours. Thyroid Function Tests: No results for input(s): TSH, T4TOTAL, FREET4, T3FREE, THYROIDAB in the last 72 hours. Anemia Panel: No results for input(s): VITAMINB12, FOLATE, FERRITIN, TIBC, IRON, RETICCTPCT in the last 72 hours. Sepsis Labs: No results for input(s): PROCALCITON, LATICACIDVEN in the last 168 hours.  Recent Results (from the past 240 hour(s))  Urine culture     Status: Abnormal   Collection Time: 07/11/20  8:40  PM   Specimen: Urine, Random  Result Value Ref Range Status   Specimen Description   Final    URINE, RANDOM Performed at [facility] [address] [place], [place] [address]    Special Requests   Final    NONE Performed at [facility], [place] [address], [place], [place] [address]    Culture (A)  Final    <10,000 COLONIES/mL INSIGNIFICANT GROWTH Performed at [facility] [address], [place], [place] [address]    Report Status 07/12/2020 FINAL  Final      Radiology Studies: No results found.   Scheduled Meds: . amitriptyline  30 mg Oral QHS  . Darunavir-Cobicisctat-Emtricitabine-Tenofovir Alafenamide  1 tablet Oral Q breakfast  . docusate sodium  100 mg Oral BID  . dolutegravir  50 mg Oral Daily  . pantoprazole  40 mg Oral Daily   Continuous Infusions: . sodium chloride Stopped (07/13/20 2004)  . sulfamethoxazole-trimethoprim 416 mg (07/15/20 0815)     LOS: 4 days    Time spent: 25 mins.    Shawna Clamp, MD [facility]   If 7PM-7AM, please contact night-coverage

## 2020-07-16 MED ORDER — SULFAMETHOXAZOLE-TRIMETHOPRIM 800-160 MG PO TABS
1.0000 | ORAL_TABLET | Freq: Two times a day (BID) | ORAL | 1 refills | Status: DC
Start: 2020-07-16 — End: 2020-07-17

## 2020-07-16 MED ORDER — TIVICAY 50 MG PO TABS: 50.0000 mg | ORAL_TABLET | Freq: Every day | ORAL | 1 refills | Status: DC

## 2020-07-16 MED ORDER — SYMTUZA 800-150-200-10 MG PO TABS: 1.0000 | ORAL_TABLET | Freq: Every day | ORAL | 1 refills | Status: DC

## 2020-07-16 MED FILL — SYMTUZA 800-150-200-10 MG T: 800-150-200 | 30 days supply | Qty: 30 | Fill #0

## 2020-07-16 MED FILL — TIVICAY 50 MG TABLET: 50 | 30 days supply | Qty: 30 | Fill #0

## 2020-07-16 NOTE — Progress Notes (Signed)
Patient ID: Kelsey Rodriguez, female   DOB: 03-24-1991, 29 y.o.   MRN: [id number]          [facility] for Infectious Disease    Date of Admission:  07/11/2020     She denies having any headache or visual disturbances currently.  I recommend discharge home on Tivicay, Symtuza and Trimethoprim/Sulfamethoxazole 1 double strength tablet twice daily.  I reinforced with her the utmost importance of not missing doses.  I will arrange follow-up in our clinic on 07/19/2020 so she can meet with our ID pharmacist to see if there is a reasonable regimen that has smaller pills then [name].  We will also arrange for her to meet with our behavioral health counselor.  If she will take her medications consistently and correctly she will get better.  I will sign off now.         Michel Bickers, MD [facility] [place] [facility] [phone] pager   [phone] cell 07/16/2020, 1:23 PM

## 2020-07-16 NOTE — Discharge Instructions (Signed)
Cervicogenic Headache  A cervicogenic headache is a headache caused by a condition that affects the bones and tissues in your neck (cervical spine). In a cervicogenic headache, the pain moves from your neck to your head. Most cervicogenic headaches start in the upper part of the neck with the first three cervical bones (cervical vertebrae). A cervicogenic headache is diagnosed when a cause can be found in the cervical spine and other causes of headaches can be ruled out. What are the causes? The most common cause of this condition is a traumatic injury to the cervical spine, such as whiplash. Other causes include:  Arthritis.  Broken bone (fracture).  Infection.  Tumor. What are the signs or symptoms? The most common symptoms are neck and head pain. The pain is often located on one side. In some cases, there may be head pain without neck pain. Pain may be felt in the neck, back or side of the head, face, or behind the eyes. Other symptoms include:  Limited movement in the neck.  Arm or shoulder pain. How is this diagnosed? This condition may be diagnosed based on:  Your symptoms.  A physical exam.  An injection that blocks nerve signals (nerve block).  Imaging tests, such as: ? X-rays. ? CT scan. ? MRI. How is this treated? Treatment for this condition may depend on the underlying condition. Treatment may include:  Medicines, such as: ? NSAIDs. ? Muscle relaxants.  Physical therapy.  Massage therapy.  Complementary therapies, such as: ? Biofeedback. ? Meditation. ? Acupuncture.  Nerve block injections.  Botulinum toxin injections. Your treatment plan may involve working with a pain management team that includes your primary health care provider, a pain management specialist, a neurologist, and a physical therapist. Follow these instructions at home:  Take over-the-counter and prescription medicines only as told by your health care provider.  Do exercises  at home as told by your physical therapist.  Return to your normal activities as told by your health care provider. Ask your health care provider what activities are safe for you. Avoid activities that trigger your headaches.  Maintain good neck support and posture at home and at work.  Keep all follow-up visits as told by your health care provider. This is important. Contact a health care provider if you have:  Headaches that are getting worse and happening more often.  Headaches with any of the following: ? Fever. ? Numbness. ? Weakness. ? Dizziness. ? Nausea or vomiting. Get help right away if:  You have a very sudden and severe headache. Summary  A cervicogenic headache is a headache caused by a condition that affects the bones and tissues in your cervical spine.  Your health care provider may diagnose this condition with a physical exam, a nerve block, and imaging tests.  Treatment may include medicine to reduce pain and inflammation, physical therapy, and nerve block injections.  Complementary therapies, such as acupuncture and meditation, may be added to other treatments.  Your treatment plan may involve working with a pain management team that includes your primary health care provider, a pain management specialist, a neurologist, and a physical therapist. This information is not intended to replace advice given to you by your health care provider. Make sure you discuss any questions you have with your health care provider. Document Revised: 03/23/2019 Document Reviewed: 12/11/2017 Elsevier Patient Education  [place].   Cervicogenic Headache  A cervicogenic headache is a headache caused by a condition that affects the bones  and tissues in your neck (cervical spine). In a cervicogenic headache, the pain moves from your neck to your head. Most cervicogenic headaches start in the upper part of the neck with the first three cervical bones (cervical vertebrae). A  cervicogenic headache is diagnosed when a cause can be found in the cervical spine and other causes of headaches can be ruled out. What are the causes? The most common cause of this condition is a traumatic injury to the cervical spine, such as whiplash. Other causes include:  Arthritis.  Broken bone (fracture).  Infection.  Tumor. What are the signs or symptoms? The most common symptoms are neck and head pain. The pain is often located on one side. In some cases, there may be head pain without neck pain. Pain may be felt in the neck, back or side of the head, face, or behind the eyes. Other symptoms include:  Limited movement in the neck.  Arm or shoulder pain. How is this diagnosed? This condition may be diagnosed based on:  Your symptoms.  A physical exam.  An injection that blocks nerve signals (nerve block).  Imaging tests, such as: ? X-rays. ? CT scan. ? MRI. How is this treated? Treatment for this condition may depend on the underlying condition. Treatment may include:  Medicines, such as: ? NSAIDs. ? Muscle relaxants.  Physical therapy.  Massage therapy.  Complementary therapies, such as: ? Biofeedback. ? Meditation. ? Acupuncture.  Nerve block injections.  Botulinum toxin injections. Your treatment plan may involve working with a pain management team that includes your primary health care provider, a pain management specialist, a neurologist, and a physical therapist. Follow these instructions at home:  Take over-the-counter and prescription medicines only as told by your health care provider.  Do exercises at home as told by your physical therapist.  Return to your normal activities as told by your health care provider. Ask your health care provider what activities are safe for you. Avoid activities that trigger your headaches.  Maintain good neck support and posture at home and at work.  Keep all follow-up visits as told by your health care  provider. This is important. Contact a health care provider if you have:  Headaches that are getting worse and happening more often.  Headaches with any of the following: ? Fever. ? Numbness. ? Weakness. ? Dizziness. ? Nausea or vomiting. Get help right away if:  You have a very sudden and severe headache. Summary  A cervicogenic headache is a headache caused by a condition that affects the bones and tissues in your cervical spine.  Your health care provider may diagnose this condition with a physical exam, a nerve block, and imaging tests.  Treatment may include medicine to reduce pain and inflammation, physical therapy, and nerve block injections.  Complementary therapies, such as acupuncture and meditation, may be added to other treatments.  Your treatment plan may involve working with a pain management team that includes your primary health care provider, a pain management specialist, a neurologist, and a physical therapist. This information is not intended to replace advice given to you by your health care provider. Make sure you discuss any questions you have with your health care provider. Document Revised: 03/23/2019 Document Reviewed: 12/11/2017 Elsevier Patient Education  2020 [place].   Epidural Blood Patch for Spinal Headache An epidural blood patch is a procedure that is used to treat a headache that happens when there is a leak of spinal fluid. This type of headache  is called a spinal headache or a post-dural puncture headache. Spinal headaches sometimes happen after a person has had a lumbar puncture or epidural anesthesia. These are procedures in which a needle is passed between the bones of the spine. Generally, an epidural blood patch is done when a spinal headache has not been helped by 2-3 days of:  Resting in bed.  Drinking lots of fluids.  Taking medicines by mouth for pain. In the epidural blood patch procedure, a small amount of your blood is  removed and is then injected into the area of the spinal fluid leak. The blood will form a clot to seal the leak.  Tell a health care provider about:  Any allergies you have.  All medicines you are taking, including vitamins, herbs, eye drops, creams, and over-the-counter medicines.  Any problems you or family members have had with anesthetic medicines.  Any blood disorders you have.  Any surgeries you have had.  Any medical conditions you have.  Whether you are pregnant or may be pregnant. What are the risks? Generally, this is a safe procedure. However, problems may occur, including:  Backache.  Nerve pain, tingling, or numbness.  Bleeding.  Infection.  Allergic reactions to medicines or dyes.  Damage to nearby structures or organs. There may be added health problems in people who have bleeding disorders or infections. An epidural blood patch is not done when:  Your headache is caused by an infection in your lumbar area or in your blood.  You tend to bleed too much (have bleeding tendencies or bleeding disorders).  You are taking blood thinners. What happens before the procedure? Staying hydrated Follow instructions from your health care provider about hydration, which may include:  Up to 2 hours before the procedure - you may continue to drink clear liquids, such as water, clear fruit juice, black coffee, and plain tea.  Eating and drinking restrictions Follow instructions from your health care provider about eating and drinking, which may include:  24 hours before the procedure - stop drinking alcohol.  8 hours before the procedure - stop eating heavy meals or foods, such as meat, fried foods, or fatty foods.  6 hours before the procedure - stop eating light meals or foods, such as toast or cereal.  6 hours before the procedure - stop drinking milk or drinks that contain milk.  2 hours before the procedure - stop drinking clear liquids. Medicines Ask your  health care provider about:  Changing or stopping your regular medicines. This is especially important if you are taking diabetes medicines or blood thinners.  Taking medicines such as aspirin and ibuprofen. These medicines can thin your blood. Do not take these medicines unless your health care provider tells you to take them.  Taking over-the-counter medicines, vitamins, herbs, and supplements. General instructions  Ask your health care provider: ? How your injection site will be marked. ? What steps will be taken to help prevent infection. These may include:  Removing hair at the injection site.  Washing skin with a germ-killing soap.  Receiving antibiotic medicine.  Plan to have someone take you home from the hospital or clinic.  If you will be going home right after the procedure, plan to have someone with you for 24 hours.  Do not use any products that contain nicotine or tobacco, such as cigarettes, e-cigarettes, and chewing tobacco. If you need help quitting, ask your health care provider. What happens during the procedure?   IVs will be inserted  into two of your veins. One IV will give you fluids and medicines during the procedure. The other IV will be used to take blood.  You will be asked to lie on your abdomen.  You will be given one or more of the following: ? A medicine to help you relax (sedative). ? A medicine to numb the area (local anesthetic).  An X-ray machine will be used to take images of your back to show where the leaking is.  A dye may be injected so the area can be seen well on an X-ray, or an ultrasound may be done to find the area.  Blood will be drawn from your arm.  The blood will then be injected into the place in your body where the leak is. When the blood is injected, you may feel tightness in your buttocks, lower back, or thighs.  The IVs will be removed.  A bandage (dressing) will be put over the area where the IVs were.  The procedure  may vary among health care providers and hospitals What happens after the procedure?   Your blood pressure, heart rate, breathing rate, and blood oxygen level will be monitored until you leave the hospital or clinic.  Your headache may be gone almost right away, or the headache may go away more slowly over the next 24 hours.  You will be asked to lie on your back for 2-4 hours with some pillows under your knees. It is important to lie still while on your back so a good blood clot can form.  Avoid any straining, especially right after the procedure. This includes straining while using the toilet.  You may have: ? A mild backache. ? A feeling of prickly, tingly, or numb skin (paresthesia). ? Neck pain. ? Pain down your arm or leg (radiating pain).  Do not drive for 24 hours if you were given a sedative during the procedure. Summary  An epidural blood patch is a procedure that is used to treat a headache that happens when there is a leak of spinal fluid.  Spinal headaches sometimes happen after a person has had a lumbar puncture or epidural anesthesia.  In the epidural blood patch procedure, a small amount of your blood is removed and is then injected into the area of the spinal fluid leak. The blood will form a clot to seal the leak. This information is not intended to replace advice given to you by your health care provider. Make sure you discuss any questions you have with your health care provider. Document Revised: 07/18/2019 Document Reviewed: 06/29/2019 Elsevier Patient Education  [place] Headache Without Cause A headache is pain or discomfort that is felt around the head or neck area. There are many causes and types of headaches. In some cases, the cause may not be found. Follow these instructions at home: Watch your condition for any changes. Let your doctor know about them. Take these steps to help with your condition: Managing pain      Take  over-the-counter and prescription medicines only as told by your doctor.  Lie down in a dark, quiet room when you have a headache.  If told, put ice on your head and neck area: ? Put ice in a plastic bag. ? Place a towel between your skin and the bag. ? Leave the ice on for 20 minutes, 2-3 times per day.  If told, put heat on the affected area. Use the heat source that your  doctor recommends, such as a moist heat pack or a heating pad. ? Place a towel between your skin and the heat source. ? Leave the heat on for 20-30 minutes. ? Remove the heat if your skin turns bright red. This is very important if you are unable to feel pain, heat, or cold. You may have a greater risk of getting burned.  Keep lights dim if bright lights bother you or make your headaches worse. Eating and drinking  Eat meals on a regular schedule.  If you drink alcohol: ? Limit how much you use to:  0-1 drink a day for women.  0-2 drinks a day for men. ? Be aware of how much alcohol is in your drink. In the [place] one drink equals one 12 oz bottle of beer (355 mL), one 5 oz glass of wine (148 mL), or one 1 oz glass of hard liquor (44 mL).  Stop drinking caffeine, or reduce how much caffeine you drink. General instructions   Keep a journal to find out if certain things bring on headaches. For example, write down: ? What you eat and drink. ? How much sleep you get. ? Any change to your diet or medicines.  Get a massage or try other ways to relax.  Limit stress.  Sit up straight. Do not tighten (tense) your muscles.  Do not use any products that contain nicotine or tobacco. This includes cigarettes, e-cigarettes, and chewing tobacco. If you need help quitting, ask your doctor.  Exercise regularly as told by your doctor.  Get enough sleep. This often means 7-9 hours of sleep each night.  Keep all follow-up visits as told by your doctor. This is important. Contact a doctor if:  Your symptoms are not  helped by medicine.  You have a headache that feels different than the other headaches.  You feel sick to your stomach (nauseous) or you throw up (vomit).  You have a fever. Get help right away if:  Your headache gets very bad quickly.  Your headache gets worse after a lot of physical activity.  You keep throwing up.  You have a stiff neck.  You have trouble seeing.  You have trouble speaking.  You have pain in the eye or ear.  Your muscles are weak or you lose muscle control.  You lose your balance or have trouble walking.  You feel like you will pass out (faint) or you pass out.  You are mixed up (confused).  You have a seizure. Summary  A headache is pain or discomfort that is felt around the head or neck area.  There are many causes and types of headaches. In some cases, the cause may not be found.  Keep a journal to help find out what causes your headaches. Watch your condition for any changes. Let your doctor know about them.  Contact a doctor if you have a headache that is different from usual, or if your headache is not helped by medicine.  Get help right away if your headache gets very bad, you throw up, you have trouble seeing, you lose your balance, or you have a seizure. This information is not intended to replace advice given to you by your health care provider. Make sure you discuss any questions you have with your health care provider. Document Revised: 06/21/2018 Document Reviewed: 06/21/2018 Elsevier Patient Education  [place].   Epidural Blood Patch for Spinal Headache, Care After This sheet gives you information about how to care  for yourself after your procedure. Your health care provider may also give you more specific instructions. If you have problems or questions, contact your health care provider. What can I expect after the procedure? After the procedure, it is common to have:  Relief from your headache almost right away, or  slow easing of pain over the next 24 hours.  Mild backaches. These may last for a few days.  A mild feeling of prickly, tingly, or numb skin (paresthesia).  Neck pain.  Pain down your arm or leg (radiating pain). Follow these instructions at home:  Injection site care   Follow instructions from your health care provider about how to take care of your injection site. Make sure you: ? Wash your hands with soap and water before and after you change your bandage (dressing). If soap and water are not available, use hand sanitizer. ? Change or remove your dressing as told by your health care provider.  Check your injection area every day for signs of infection. Check for: ? Redness, swelling, or pain. ? Fluid or blood. ? Warmth. ? Pus or a bad smell. Activity   For the first 24 hours after the procedure, rest in bed as much as you can.  Do not drive for 24 hours if you were given a sedative during your procedure.  When picking up items from a low position, squat by bending your knees. Do not bend at the waist.  Avoid too much straining, including while using the toilet. This can cause the blood patch to move out of position and cause the headache to return.  Do not lift anything that is heavier than 10 lb (4.5 kg), or the limit that you are told, until your health care provider says that it is safe.  Return to your normal activities as told by your health care provider. Ask your health care provider what activities are safe for you. General instructions  Drink enough fluids to keep your urine pale yellow.  Do not take baths, swim, or use a hot tub until your health care provider approves. Ask your health care provider if you may take showers. You may only be allowed to take sponge baths.  Take over-the-counter and prescription medicines only as told by your health care provider.  Keep all follow-up visits as told by your health care provider. This is important. Contact a  health care provider if:  You have redness, swelling, or pain around your injection site.  You have fluid or blood coming from your injection site.  Your injection site feels warm to the touch.  You have pus or a bad smell coming from your injection site.  You have a fever.  You have back pain or pain that goes down your legs, or you have trouble with your bowels or bladder.  Your headache comes back. Get help right away if you have:  A very bad headache.  Nausea or vomiting. Summary  After the procedure, it is common to have relief from your headache, mild backaches, mild numbness, neck pain, and some pain down your arm or leg.  Follow instructions from your health care provider about how to take care of your injection site.  For the first 24 hours after the procedure, rest in bed as much as you can.  Get help right away if you have a very bad headache, nausea, or vomiting. This information is not intended to replace advice given to you by your health care provider. Make sure you  discuss any questions you have with your health care provider. Document Revised: 06/29/2019 Document Reviewed: 06/29/2019 Elsevier Patient Education  [place].  Form - Headache Record There are many types and causes of headaches. A headache record can help guide your treatment plan. Use this form to record the details. Bring this form with you to your follow-up visits. Follow your health care provider's instructions on how to describe your headache. You may be asked to:  Use a pain scale. This is a tool to rate the intensity of your headache using words or numbers.  Describe what your headache feels like, such as dull, achy, throbbing, or sharp. Headache record Date: _______________ Time (from start to end): ____________________ Location of the headache: _________________________  Intensity of the headache: ____________________ Description of the headache:  ______________________________________________________________  Hours of sleep the night before the headache: __________  Food or drinks before the headache started: ______________________________________________________________________________________  Events before the headache started: _______________________________________________________________________________________________  Symptoms before the headache started: __________________________________________________________________________________________  Symptoms during the headache: __________________________________________________________________________________________________  Treatment: ________________________________________________________________________________________________________________  Effect of treatment: _________________________________________________________________________________________________________  Other comments: ___________________________________________________________________________________________________________ Date: _______________ Time (from start to end): ____________________ Location of the headache: _________________________  Intensity of the headache: ____________________ Description of the headache: ______________________________________________________________  Hours of sleep the night before the headache: __________  Food or drinks before the headache started: ______________________________________________________________________________________  Events before the headache started: ____________________________________________________________________________________________  Symptoms before the headache started: _________________________________________________________________________________________  Symptoms during the headache: _______________________________________________________________________________________________  Treatment:  ________________________________________________________________________________________________________________  Effect of treatment: _________________________________________________________________________________________________________  Other comments: ___________________________________________________________________________________________________________ Date: _______________ Time (from start to end): ____________________ Location of the headache: _________________________  Intensity of the headache: ____________________ Description of the headache: ______________________________________________________________  Hours of sleep the night before the headache: __________  Food or drinks before the headache started: ______________________________________________________________________________________  Events before the headache started: ____________________________________________________________________________________________  Symptoms before the headache started: _________________________________________________________________________________________  Symptoms during the headache: _______________________________________________________________________________________________  Treatment: ________________________________________________________________________________________________________________  Effect of treatment: _________________________________________________________________________________________________________  Other comments: ___________________________________________________________________________________________________________ Date: _______________ Time (from start to end): ____________________ Location of the headache: _________________________  Intensity of the headache: ____________________ Description of the headache: ______________________________________________________________  Hours of sleep the night before the headache: _________  Food or drinks  before the headache started: ______________________________________________________________________________________  Events before the headache started: ____________________________________________________________________________________________  Symptoms before the headache started: _________________________________________________________________________________________  Symptoms during the headache: _______________________________________________________________________________________________  Treatment: ________________________________________________________________________________________________________________  Effect of treatment: _________________________________________________________________________________________________________  Other comments: ___________________________________________________________________________________________________________ Date: _______________ Time (from start to end): ____________________ Location of the headache: _________________________  Intensity of the headache: ____________________ Description of the headache: ______________________________________________________________  Hours of sleep the night before the headache: _________  Food or drinks before the headache started: ______________________________________________________________________________________  Events before the headache started: ____________________________________________________________________________________________  Symptoms before the headache started: _________________________________________________________________________________________  Symptoms during the headache: _______________________________________________________________________________________________  Treatment: ________________________________________________________________________________________________________________  Effect of treatment:  _________________________________________________________________________________________________________  Other comments: ___________________________________________________________________________________________________________ This information is not intended to replace advice given to you by your health care provider. Make sure you discuss any questions you have with your health care provider. Document Revised: 12/20/2018 Document Reviewed: 12/20/2018 Elsevier Patient Education  [place] Headache Without Cause A headache is pain or discomfort felt around the head or neck area. The specific cause of a headache may not be found. There are many causes and types of headaches. A few common ones are:  Tension headaches.  Migraine headaches.  Cluster headaches.  Chronic daily headaches. Follow these instructions at home: Watch your condition for any  changes. Let your health care provider know about them. Take these steps to help with your condition: Managing pain      Take over-the-counter and prescription medicines only as told by your health care provider.  Lie down in a dark, quiet room when you have a headache.  If directed, put ice on your head and neck area: ? Put ice in a plastic bag. ? Place a towel between your skin and the bag. ? Leave the ice on for 20 minutes, 2-3 times per day.  If directed, apply heat to the affected area. Use the heat source that your health care provider recommends, such as a moist heat pack or a heating pad. ? Place a towel between your skin and the heat source. ? Leave the heat on for 20-30 minutes. ? Remove the heat if your skin turns bright red. This is especially important if you are unable to feel pain, heat, or cold. You may have a greater risk of getting burned.  Keep lights dim if bright lights bother you or make your headaches worse. Eating and drinking  Eat meals on a regular schedule.  If you drink  alcohol: ? Limit how much you use to:  0-1 drink a day for women.  0-2 drinks a day for men. ? Be aware of how much alcohol is in your drink. In the [place] one drink equals one 12 oz bottle of beer (355 mL), one 5 oz glass of wine (148 mL), or one 1 oz glass of hard liquor (44 mL).  Stop drinking caffeine, or decrease the amount of caffeine you drink. General instructions   Keep a headache journal to help find out what may trigger your headaches. For example, write down: ? What you eat and drink. ? How much sleep you get. ? Any change to your diet or medicines.  Try massage or other relaxation techniques.  Limit stress.  Sit up straight, and do not tense your muscles.  Do not use any products that contain nicotine or tobacco, such as cigarettes, e-cigarettes, and chewing tobacco. If you need help quitting, ask your health care provider.  Exercise regularly as told by your health care provider.  Sleep on a regular schedule. Get 7-9 hours of sleep each night, or the amount recommended by your health care provider.  Keep all follow-up visits as told by your health care provider. This is important. Contact a health care provider if:  Your symptoms are not helped by medicine.  You have a headache that is different from the usual headache.  You have nausea or you vomit.  You have a fever. Get help right away if:  Your headache becomes severe quickly.  Your headache gets worse after moderate to intense physical activity.  You have repeated vomiting.  You have a stiff neck.  You have a loss of vision.  You have problems with speech.  You have pain in the eye or ear.  You have muscular weakness or loss of muscle control.  You lose your balance or have trouble walking.  You feel faint or pass out.  You have confusion.  You have a seizure. Summary  A headache is pain or discomfort felt around the head or neck area.  There are many causes and types of  headaches. In some cases, the cause may not be found.  Keep a headache journal to help find out what may trigger your headaches. Watch your condition for any changes. Let your health care provider  know about them.  Contact a health care provider if you have a headache that is different from the usual headache, or if your symptoms are not helped by medicine.  Get help right away if your headache becomes severe, you vomit, you have a loss of vision, you lose your balance, or you have a seizure. This information is not intended to replace advice given to you by your health care provider. Make sure you discuss any questions you have with your health care provider. Document Revised: 06/21/2018 Document Reviewed: 06/21/2018 Elsevier Patient Education  2020 [place] to follow-up with infectious disease,  appointment has been made on August 5,2021. Advised to take Bactrim DS twice a day until follow-up with ID. Advised to continue Symtuza and Trivacay daily as advised.

## 2020-07-16 NOTE — Discharge Summary (Signed)
Physician Discharge Summary  Massa Pe Alas IWP:[id number] DOB: Jun 10, 1991 DOA: 07/11/2020  PCP: Gerlene Fee, DO  Admit date: 07/11/2020  Discharge date: 07/16/2020  Admitted From:  Home.  Disposition:  Home  Recommendations for Outpatient Follow-up:  Follow up with PCP in 1-2 weeks. Please obtain BMP/CBC in one week. Advised to follow-up with infectious disease,  appointment has been made on August 5,2021. Advised to take Bactrim DS twice a day until follow-up with Infectious Diseases. Advised to take [place] as prescribed.  Home Health: No  Equipment/Devices: No  Discharge Condition: Stable. CODE STATUS:Full code Diet recommendation: Heart Healthy   Brief [facility] course : Lanyah Bih [name] is a 29 y.o. 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  Patient reports intermittent headache,  fever and blurring of vision for some time.  Patient is not taking any medication for HIV.  She reports worsening headache associated with fever of 102 and blurring of vision two weeks ago. She went to the emergency room where there was a long wait to be seen so she left the emergency room without being seen and went home, She has been managing  her headache with naproxen and Tylenol.  She reports pain gets better.  she went to see Dr. Graylon Good Infectious Diseases in the Clinic and was told to get hospitalized for IV antibiotics.  She was previously advised to be admitted for re-initiation of antibiotics plus brain biopsy however she did not elect to stay hospitalized. She did follow through with brain lesion biopsy in early June with the pathology c/w CNS toxo and ruled out lymphoma. She has been taking bactrim DS 1 tab BID but states she is not back on her HIV regimen - with tivicay-symtuza- for unclear reasons. She also reports history of seizure disorder, has not taken  antiseizure medications in the last 2 months. She is admitted for poorly controlled HIV with CNS toxoplasmosis.  Infectious disease was consulted, Patient was started on IV Bactrim,  Patient was also started on [place] and Tivicay for HIV.  Patient  tolerated well , She has received iv antibiotics for 3 days . She wants to go home, Patient is cleared from ID to be discharged.  Patient has been discharged home on Bactrim twice daily, Symtuza and Tivicay as per ID.  Patient has follow-up appointment on August 5 in the infectious disease clinic.  Discharge Diagnoses:  Principal Problem:   Toxoplasmosis Active Problems:   HIV (human immunodeficiency virus infection) ([place])   Primary adrenal insufficiency ([place])   Seizure ([place])  She is managed for below problems.  Intermittent headache, fever and blurring of vision: >>> Resolved. Patient has diagnosed toxoplasmosis.  Direct admit for IV antibiotics. Infectious diseases on the board. Pharmacy consulted for IV Bactrim and other medications Continue IV Bactrim for now Tylenol and ibuprofen as needed for pain control.     HIV: Poorly controlled: CD4 count 107, Viral Load 210000. Continue antiretroviral therapy.   Seizure disorder: Patient reports not taking antiseizure medication for last 2 months.    Discharge Instructions  Discharge Instructions     Call MD for:  difficulty breathing, headache or visual disturbances   Complete by: As directed    Call MD for:  persistant nausea and vomiting   Complete by: As directed    Call MD for:  severe uncontrolled pain   Complete by: As directed  Call MD for:  temperature >100.4   Complete by: As directed    Diet - low sodium heart healthy   Complete by: As directed    Discharge instructions   Complete by: As directed    Advised to follow-up with infectious disease,  appointment has been made on August 5,2021. Advised to take Bactrim DS twice a day until follow-up with ID   Increase  activity slowly   Complete by: As directed       Allergies as of 07/16/2020       Reactions   Hydrocodone Itching, Nausea Only   Tolerates Oxycodone   Tramadol Itching, Nausea Only   Tolerates oxycodone        Medication List     STOP taking these medications    polyethylene glycol 17 g packet Commonly known as: MIRALAX / GLYCOLAX       TAKE these medications    amitriptyline 10 MG tablet Commonly known as: ELAVIL Take 3 tablets (30 mg total) by mouth at bedtime.   docusate sodium 100 MG capsule Commonly known as: COLACE Take 1 capsule (100 mg total) by mouth 2 (two) times daily. What changed:  when to take this reasons to take this   levETIRAcetam 500 MG tablet Commonly known as: Keppra Take 1 tablet (500 mg total) by mouth 2 (two) times daily. What changed:  when to take this reasons to take this   sulfamethoxazole-trimethoprim 800-160 MG tablet Commonly known as: BACTRIM DS Take 1 tablet by mouth 2 (two) times daily.   Symtuza 800-150-200-10 MG Tabs Generic drug: Darunavir-Cobicisctat-Emtricitabine-Tenofovir Alafenamide Take 1 tablet by mouth daily with breakfast.   Tivicay 50 MG tablet Generic drug: dolutegravir Take 1 tablet (50 mg total) by mouth daily.        Follow-up Information     Autry-Lott, Simone, DO Follow up in 1 week(s).   Specialty: Family Medicine Contact information: [address] [place] [address] [phone]         Carlyle Basques, MD .   Specialty: Infectious Diseases Contact information: [address] [place] [address] [phone]                Allergies  Allergen Reactions   Hydrocodone Itching and Nausea Only    Tolerates Oxycodone   Tramadol Itching and Nausea Only    Tolerates oxycodone    Consultations: Infectious diseases.   Procedures/Studies: X-ray chest PA and lateral  Result Date: 07/11/2020 CLINICAL DATA:  Vision change and headache EXAM: CHEST - 2  VIEW COMPARISON:  02/13/2020 FINDINGS: The heart size and mediastinal contours are within normal limits. Both lungs are clear. The visualized skeletal structures are unremarkable. IMPRESSION: No active cardiopulmonary disease. Electronically Signed   By: Donavan Foil M.D.   On: 07/11/2020 20:06      Subjective: Patient was seen and examined at bedside.  She denies any headache, no overnight events.  She reports feeling better and wants to be discharged home.  Discharge Exam: Vitals:   07/16/20 0519 07/16/20 1421  BP: 118/85 124/88  Pulse: 79 96  Resp: 15 14  Temp: 98 F (36.7 C) 98.4 F (36.9 C)  SpO2: 99% 98%   Vitals:   07/15/20 0529 07/15/20 1401 07/16/20 0519 07/16/20 1421  BP: 107/73 110/78 118/85 124/88  Pulse: 79 85 79 96  Resp: 15 18 15 14   Temp: 98.2 F (36.8 C) 98.5 F (36.9 C) 98 F (36.7 C) 98.4 F (36.9 C)  TempSrc: Oral Oral Oral Oral  SpO2: 100% 100% 99% 98%  Weight:      Height:        General: Pt is alert, awake, not in acute distress Cardiovascular: RRR, S1/S2 +, no rubs, no gallops Respiratory: CTA bilaterally, no wheezing, no rhonchi Abdominal: Soft, NT, ND, bowel sounds + Extremities: no edema, no cyanosis    The results of significant diagnostics from this hospitalization (including imaging, microbiology, ancillary and laboratory) are listed below for reference.     Microbiology: Recent Results (from the past 240 hour(s))  Urine culture     Status: Abnormal   Collection Time: 07/11/20  8:40 PM   Specimen: Urine, Random  Result Value Ref Range Status   Specimen Description   Final    URINE, RANDOM Performed at [facility] [address] [place], [place] [address]    Special Requests   Final    NONE Performed at [facility], [place] [address], [place], [place] [address]    Culture (A)  Final    <10,000 COLONIES/mL INSIGNIFICANT GROWTH Performed at [facility] [address]  [place], [place] [address]    Report Status 07/12/2020 FINAL  Final     Labs: BNP (last 3 results) No results for input(s): BNP in the last 8760 hours. Basic Metabolic Panel: Recent Labs  Lab 07/11/20 2021 07/12/20 0541  NA 141 139  K 3.9 4.0  CL 105 108  CO2 23 22  GLUCOSE 87 89  BUN 9 7  CREATININE 0.78 0.66  CALCIUM 9.9 8.8*  MG 2.1  --   PHOS 3.4  --    Liver Function Tests: Recent Labs  Lab 07/11/20 2021 07/12/20 0541  AST 20 16  ALT 16 15  ALKPHOS 102 89  BILITOT 0.4 0.5  PROT 7.9 6.9  ALBUMIN 4.7 3.8   No results for input(s): LIPASE, AMYLASE in the last 168 hours. No results for input(s): AMMONIA in the last 168 hours. CBC: Recent Labs  Lab 07/11/20 2021 07/12/20 0541 07/15/20 0633  WBC 3.4* 2.7* 2.9*  NEUTROABS 1.6*  --   --   HGB 12.4 11.6* 11.3*  HCT 37.7 35.3* 33.9*  MCV 91.3 91.5 90.2  PLT 234 211 211   Cardiac Enzymes: No results for input(s): CKTOTAL, CKMB, CKMBINDEX, TROPONINI in the last 168 hours. BNP: Invalid input(s): POCBNP CBG: No results for input(s): GLUCAP in the last 168 hours. D-Dimer No results for input(s): DDIMER in the last 72 hours. Hgb A1c No results for input(s): HGBA1C in the last 72 hours. Lipid Profile No results for input(s): CHOL, HDL, LDLCALC, TRIG, CHOLHDL, LDLDIRECT in the last 72 hours. Thyroid function studies No results for input(s): TSH, T4TOTAL, T3FREE, THYROIDAB in the last 72 hours.  Invalid input(s): FREET3 Anemia work up No results for input(s): VITAMINB12, FOLATE, FERRITIN, TIBC, IRON, RETICCTPCT in the last 72 hours. Urinalysis    Component Value Date/Time   COLORURINE YELLOW 07/11/2020 2040   APPEARANCEUR CLEAR 07/11/2020 2040   LABSPEC 1.021 07/11/2020 2040   PHURINE 6.0 07/11/2020 2040   GLUCOSEU NEGATIVE 07/11/2020 2040   HGBUR NEGATIVE 07/11/2020 2040   BILIRUBINUR NEGATIVE 07/11/2020 2040   BILIRUBINUR negative 12/31/2018 [place] 07/11/2020 2040   PROTEINUR NEGATIVE  07/11/2020 2040   UROBILINOGEN 0.2 12/31/2018 1550   UROBILINOGEN 0.2 09/14/2015 0829   NITRITE NEGATIVE 07/11/2020 2040   LEUKOCYTESUR NEGATIVE 07/11/2020 2040   Sepsis Labs Invalid input(s): PROCALCITONIN,  WBC,  LACTICIDVEN Microbiology Recent Results (from the past 240 hour(s))  Urine culture     Status: Abnormal   Collection Time: 07/11/20  8:40 PM   Specimen: Urine, Random  Result Value Ref Range Status   Specimen Description   Final    URINE, RANDOM Performed at [facility] [address], [place], [place] [address]    Special Requests   Final    NONE Performed at [facility], [place] [address] [place], [place] [address]    Culture (A)  Final    <10,000 COLONIES/mL INSIGNIFICANT GROWTH Performed at [facility] [address] [place], [place] [address]    Report Status 07/12/2020 FINAL  Final     Time coordinating discharge: Over 30 minutes  SIGNED:   Shawna Clamp, MD  [facility] 07/16/2020, 2:47 PM Pager   If 7PM-7AM, please contact night-coverage [email or web address]

## 2020-07-16 NOTE — Progress Notes (Signed)
Discharge instructions explained to patient. Medications due next were written on her discharge papers. Prescriptions called to [facility]. Patient continues to deny any pain or light headedness.Husband picking patient up to bring her home. Kelsey Rodriguez refused a wheelchair, she requested to walk out, tech accompanied her to the front door.

## 2020-07-17 ENCOUNTER — Other Ambulatory Visit: Payer: Self-pay | Admitting: Pharmacist

## 2020-07-17 DIAGNOSIS — B20 Human immunodeficiency virus [HIV] disease: Secondary | ICD-10-CM

## 2020-07-17 MED ORDER — SULFAMETHOXAZOLE-TRIMETHOPRIM 800-160 MG PO TABS: 1.0000 | ORAL_TABLET | Freq: Every day | ORAL | 5 refills | Status: DC

## 2020-07-17 MED FILL — SULFAMETHOXAZOLE-TMP DS TAB: 800-160 | 30 days supply | Qty: 30 | Fill #0

## 2020-07-17 NOTE — Progress Notes (Signed)
Resending in Bactrim Rx for one DS daily for PCP ppx.

## 2020-07-19 ENCOUNTER — Ambulatory Visit: Payer: Medicaid Other

## 2020-07-19 ENCOUNTER — Other Ambulatory Visit: Payer: Self-pay | Admitting: Internal Medicine

## 2020-07-19 ENCOUNTER — Ambulatory Visit (INDEPENDENT_AMBULATORY_CARE_PROVIDER_SITE_OTHER): Payer: Medicaid Other | Admitting: Pharmacist

## 2020-07-19 ENCOUNTER — Other Ambulatory Visit: Payer: Self-pay

## 2020-07-19 ENCOUNTER — Other Ambulatory Visit: Payer: Self-pay | Admitting: Pharmacist

## 2020-07-19 ENCOUNTER — Other Ambulatory Visit: Payer: Self-pay | Admitting: *Deleted

## 2020-07-19 DIAGNOSIS — B20 Human immunodeficiency virus [HIV] disease: Secondary | ICD-10-CM

## 2020-07-19 DIAGNOSIS — Z9114 Patient's other noncompliance with medication regimen: Secondary | ICD-10-CM

## 2020-07-19 DIAGNOSIS — B582 Toxoplasma meningoencephalitis: Secondary | ICD-10-CM

## 2020-07-19 MED ORDER — SULFADIAZINE 500 MG PO TABS: 1500.0000 mg | ORAL_TABLET | Freq: Four times a day (QID) | ORAL | 0 refills | Status: DC

## 2020-07-19 MED ORDER — PYRIMETHAMINE 25 MG PO TABS: 75.0000 mg | ORAL_TABLET | Freq: Every day | ORAL | 0 refills | Status: DC

## 2020-07-19 MED ORDER — LEUCOVORIN CALCIUM 15 MG PO TABS: 15.0000 mg | ORAL_TABLET | Freq: Every day | ORAL | 0 refills | Status: DC

## 2020-07-19 NOTE — Progress Notes (Signed)
HPI: Kelsey Rodriguez is a 29 y.o. female who presents to the [facility] for HIV follow-up.  Patient Active Problem List   Diagnosis Date Noted  . S/P craniotomy 05/16/2020  . Brain tumor ([place]) 05/16/2020  . 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 Hypertension   . Pain of upper abdomen   . Suicide ideation   . 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[place])   . Seizure ([place])   . Intracranial mass   . Intractable headache 02/04/2020  . Encephalitis, myelitis, 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[place]) 01/31/2020  . Rotator cuff strain 01/26/2020  . 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 AIDS (acquired immune deficiency syndrome) ([place]) 11/07/2019  . Cerebral edema ([place]) 10/28/2019  . Midline shift of brain   . Pruritus 08/29/2019  . Tendinopathy of left shoulder 01/18/2019  . Chronic pelvic pain in female 01/04/2019  . Lower abdominal pain 06/21/2018  . Syphilis 02/26/2018  . Tuberculosis   . Infertility, female 02/12/2018  . 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 Acute right-sided low back pain with right-sided sciatica 08/24/2017  . Complex regional pain syndrome 02/03/2017  . Headache 10/28/2016  . Avascular necrosis of bone of right hip ([place]) 04/04/2016  . 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 Avascular necrosis of bone of left hip ([place]) 12/14/2015  . 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 Nausea 05/18/2015  . Vertigo 01/23/2015  . Primary adrenal insufficiency ([place]) 01/03/2015  . Chest pain 07/07/2013  . HIV (human immunodeficiency virus infection) ([place]) 03/16/2012  . 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   DARUNAVIR-COBICISCTAT-EMTRICITABINE-TENOFOVIR ALAFENAMIDE (SYMTUZA)  800-150-200-10 MG TABS    Take 1 tablet by mouth daily with breakfast.   DOCUSATE SODIUM (COLACE) 100 MG CAPSULE    Take 1 capsule (100 mg total) by mouth 2 (two) times daily.   DOLUTEGRAVIR (TIVICAY) 50 MG TABLET    Take 1 tablet (50 mg total) by mouth daily.   LEVETIRACETAM (KEPPRA) 500 MG TABLET    Take 1 tablet (500 mg total) by mouth 2 (two) times daily.   SULFAMETHOXAZOLE-TRIMETHOPRIM (BACTRIM DS) 800-160 MG TABLET    Take 1 tablet by mouth daily.  Modified Medications   No medications on file  Discontinued Medications   No medications on file    Allergies: Allergies  Allergen Reactions  . Hydrocodone Itching and Nausea Only    Tolerates Oxycodone  . Tramadol Itching and Nausea Only    Tolerates oxycodone    Past Medical History: Past Medical History:  Diagnosis Date  . 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 Adrenal insufficiency ([place])   . Anemia of chronic disease 03/11/2012  . Back pain of lumbar region with sciatica 02/12/2015  . Bell's palsy 08/26/2013  . Brain lesion   . 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 Chronic back pain   . Chronic leg pain    bilateral knees, ankles  . Depression   . Fatigue   . GERD (gastroesophageal reflux disease)   . Headache   . 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 HIV (human immunodeficiency virus infection) ([place]) 02/2012  . Laceration of ankle, right 11/18/2012  . Lumbar radiculopathy   . Meningitis 02/18/2018  . Pelvic pain   .  PID (acute pelvic inflammatory disease) 02/26/2018  . Pneumonia   . Reflux esophagitis 03/11/2012  . Seizure ([place])   . Tuberculosis   . 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 Vertigo   . Wears glasses     Social History: Social History   Socioeconomic History  . Marital status: Married    Spouse name: Not on file  . Number of children: 0  . Years of education: college  . Highest education level: High school graduate  Occupational History  . Occupation: CNA  Tobacco Use  . Smoking status: Never  Smoker  . Smokeless tobacco: Never Used  Vaping Use  . Vaping Use: Never used  Substance and Sexual Activity  . Alcohol use: Not Currently    Alcohol/week: 0.0 standard drinks    Comment: socially  . Drug use: No  . Sexual activity: Yes    Partners: Male    Birth control/protection: None    Comment: pt. given condoms 11/2019  Other Topics Concern  . 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     Drinks about 1 soda a day    Social Determinants of Radio broadcast assistant Strain:   . Difficulty of Paying Living Expenses:   Food Insecurity:   . Worried About Charity fundraiser in the Last Year:   . Arboriculturist in the Last Year:   Transportation Needs:   . Film/video editor (Medical):   [name] Lack of Transportation (Non-Medical):   Physical Activity:   . Days of Exercise per Week:   . Minutes of Exercise per Session:   Stress:   . Feeling of Stress :   Social Connections:   . Frequency of Communication with Friends and Family:   . Frequency of Social Gatherings with Friends and Family:   . Attends Religious Services:   . Active Member of Clubs or Organizations:   . Attends Archivist Meetings:   [name] Marital Status:     Labs: Lab Results  Component Value Date   HIV1RNAQUANT 201,000 05/10/2020   HIV1RNAQUANT 693 (H) 03/07/2020   HIV1RNAQUANT 131,000 02/05/2020   CD4TABS 107 (L) 05/10/2020   CD4TABS 45 (L) 03/07/2020   CD4TABS 36 (L) 11/18/2019    RPR and STI Lab Results  Component Value Date   LABRPR Reactive (A) 10/29/2019   LABRPR REACTIVE (A) 04/28/2019   LABRPR Reactive (A) 12/31/2018   LABRPR Reactive (A) 02/18/2018   LABRPR REACTIVE (A) 11/17/2017   RPRTITER 1:2 (H) 04/28/2019   RPRTITER 1:4 (H) 11/17/2017   RPRTITER 1:16 (H) 09/01/2017   RPRTITER 1:1 05/23/2016    STI Results GC CT  08/09/2018 Negative Negative  02/18/2018 Negative Negative  02/12/2018 Negative **POSITIVE**(A)  11/20/2017 Negative  Negative  05/23/2016 Negative Negative  01/23/2015 NG: Negative CT: Negative    Hepatitis B Lab Results  Component Value Date   HEPBSAB POS (A) 09/13/2015   HEPBSAG NEGATIVE 03/13/2012   HEPBCAB NEGATIVE 03/13/2012   Hepatitis C No results found for: HEPCAB, HCVRNAPCRQN Hepatitis A Lab Results  Component Value Date   HAV POSITIVE (A) 03/13/2012   Lipids: Lab Results  Component Value Date   CHOL 215 (H) 12/25/2016   TRIG 144 12/25/2016   HDL 67 12/25/2016   CHOLHDL 3.2 12/25/2016   VLDL 29 12/25/2016   LDLCALC 119 (H) 12/25/2016    Current HIV Regimen: Symtuza + Tivicay  Assessment: Jenetta is  here today so I can fill a 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 She was discharged from the hospital earlier this week and picked up her [facility] at the pharmacy.  I filled a week's worth of pills boxes for her with the pyrimethamine, leucovorin, and sulfadiazine, and also Bactrim.  She brought her HIV medications with her and I added them to her boxes as well. The quantities of her medications were a bit off, so I will coordinate with the pharmacy to make sure she gets 6 weeks worth of her toxo medications.   She tells me today that she is going to try really hard to be adherent this round.  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 She says that she took it for a few days the first time but stopped when it made her vomit.  The second time, she states that she took all of the 6 weeks but eventually admitted to me that she was crushing up all the pills and putting it with ice cream.  I am assuming she did not get all of the treatment since she did this.    She will not crush or cut any of the pills this time and will try her best to take it as directed. I think she realizes how serious this diagnosis is. She is begging to be taken off of Symtuza and doesn't wish to split it in half either. She could potentially take Biktarvy, but I will  leave that up to Dr. Baxter Flattery when she sees her with me next week. I watched her take the initial 8 pills of the pyrimethamine when she was sitting with me in clinic.   I will see her again next Wednesday to fill her pill boxes again. She will call me on Monday if she has issues taking or tolerating the mediations over the weekend.  Plan: - Continue Symtuza + Tivicay - Start pyrimethamine, leucovorin, sulfadiazine, and bactrim - F/u with me and Dr. Baxter Flattery again on Wednesday 8/11  Cortana Vanderford L. Adelfa Lozito, PharmD, BCIDP, AAHIVP, CPP Clinical Pharmacist Practitioner Infectious Diseases [place] for Infect[facility] Disease 07/19/2020, 11:42 AM

## 2020-07-19 NOTE — Progress Notes (Signed)
Patient expressed interest in, has questions about, home visit nurse services. Will send referral.  Patient would like to discuss specifics via [place]. Landis Gandy, RN

## 2020-07-20 MED FILL — sulfADIAZINE 500 MG TABS: 500 | 32 days supply | Qty: 384 | Fill #0

## 2020-07-23 ENCOUNTER — Telehealth: Payer: Self-pay | Admitting: Pharmacy Technician

## 2020-07-23 DIAGNOSIS — G43B0 Ophthalmoplegic migraine, not intractable: Secondary | ICD-10-CM | POA: Diagnosis not present

## 2020-07-23 DIAGNOSIS — H43812 Vitreous degeneration, left eye: Secondary | ICD-10-CM | POA: Diagnosis not present

## 2020-07-23 NOTE — Telephone Encounter (Signed)
[facility] Patient Advocate Encounter  Patient's sulfadiazine has been couriered to [facility] from [facility] and is ready for pick up.  Venida Jarvis. Nadara Mustard [place] Patient [facility] for Infectious Disease Phone: [phone] Fax:  [phone]

## 2020-07-25 ENCOUNTER — Ambulatory Visit: Payer: Medicaid Other | Admitting: Internal Medicine

## 2020-07-25 ENCOUNTER — Ambulatory Visit (INDEPENDENT_AMBULATORY_CARE_PROVIDER_SITE_OTHER): Payer: Medicaid Other | Admitting: Pharmacist

## 2020-07-25 ENCOUNTER — Telehealth: Payer: Self-pay | Admitting: *Deleted

## 2020-07-25 ENCOUNTER — Other Ambulatory Visit: Payer: Self-pay

## 2020-07-25 ENCOUNTER — Other Ambulatory Visit: Payer: Self-pay | Admitting: Pharmacist

## 2020-07-25 ENCOUNTER — Telehealth: Payer: Self-pay | Admitting: Pharmacy Technician

## 2020-07-25 DIAGNOSIS — B582 Toxoplasma meningoencephalitis: Secondary | ICD-10-CM

## 2020-07-25 DIAGNOSIS — R21 Rash and other nonspecific skin eruption: Secondary | ICD-10-CM | POA: Diagnosis not present

## 2020-07-25 DIAGNOSIS — B20 Human immunodeficiency virus [HIV] disease: Secondary | ICD-10-CM

## 2020-07-25 MED ORDER — LEUCOVORIN CALCIUM 15 MG PO TABS: 15.0000 mg | ORAL_TABLET | Freq: Every day | ORAL | 0 refills | Status: DC

## 2020-07-25 MED ORDER — TERBINAFINE HCL 1 % EX CREA: 1.0000 "application " | TOPICAL_CREAM | Freq: Two times a day (BID) | CUTANEOUS | 1 refills | Status: DC

## 2020-07-25 MED ORDER — BICTEGRAVIR-EMTRICITAB-TENOFOV 50-200-25 MG PO TABS: 1.0000 | ORAL_TABLET | Freq: Every day | ORAL | 5 refills | Status: DC

## 2020-07-25 MED ORDER — PYRIMETHAMINE 25 MG PO TABS: 75.0000 mg | ORAL_TABLET | Freq: Every day | ORAL | 0 refills | Status: DC

## 2020-07-25 MED FILL — TERBINAFINE 1% CREAM: 1 | 15 days supply | Qty: 30 | Fill #0

## 2020-07-25 NOTE — Telephone Encounter (Addendum)
[facility] Patient Advocate Encounter   Received notification from [facility] that prior authorization for [place] is required.   PA submitted on 07/25/2020 REF: [id number] Status is [facility] will continue to follow.   Venida Jarvis. Nadara Mustard [place] Patient [facility] for Infectious Disease Phone: [phone] Fax:  [phone]

## 2020-07-25 NOTE — Progress Notes (Signed)
HPI: Nejla Bih Chronister is a 29 y.o. female who presents to the [facility] clinic for HIV follow-up.  Patient Active Problem List   Diagnosis Date Noted  . S/P craniotomy 05/16/2020  . Brain tumor ([place]) 05/16/2020  . 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 Hypertension   . Pain of upper abdomen   . Suicide ideation   . 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[place])   . Seizure ([place])   . Intracranial mass   . Intractable headache 02/04/2020  . Encephalitis, myelitis, 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[place]) 01/31/2020  . Rotator cuff strain 01/26/2020  . 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 AIDS (acquired immune deficiency syndrome) ([place]) 11/07/2019  . Cerebral edema ([place]) 10/28/2019  . Midline shift of brain   . Pruritus 08/29/2019  . Tendinopathy of left shoulder 01/18/2019  . Chronic pelvic pain in female 01/04/2019  . Lower abdominal pain 06/21/2018  . Syphilis 02/26/2018  . Tuberculosis   . Infertility, female 02/12/2018  . 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 Acute right-sided low back pain with right-sided sciatica 08/24/2017  . Complex regional pain syndrome 02/03/2017  . Headache 10/28/2016  . Avascular necrosis of bone of right hip ([place]) 04/04/2016  . 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 Avascular necrosis of bone of left hip ([place]) 12/14/2015  . 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 Nausea 05/18/2015  . Vertigo 01/23/2015  . Primary adrenal insufficiency ([place]) 01/03/2015  . Chest pain 07/07/2013  . HIV (human immunodeficiency virus infection) ([place]) 03/16/2012  . 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   TERBINAFINE (LAMISIL AT) 1 % CREAM    Apply 1 application topically 2 (two) times daily.  Previous  Medications   AMITRIPTYLINE (ELAVIL) 10 MG TABLET    Take 3 tablets (30 mg total) by mouth at bedtime.   DOCUSATE SODIUM (COLACE) 100 MG CAPSULE    Take 1 capsule (100 mg total) by mouth 2 (two) times daily.   LEVETIRACETAM (KEPPRA) 500 MG TABLET    Take 1 tablet (500 mg total) by mouth 2 (two) times daily.   SULFADIAZINE 500 MG TABLET    Take 3 tablets (1,500 mg total) by mouth 4 (four) times daily.   SULFAMETHOXAZOLE-TRIMETHOPRIM (BACTRIM DS) 800-160 MG TABLET    Take 1 tablet by mouth daily.  Modified Medications   Modified Medication Previous Medication   LEUCOVORIN (WELLCOVORIN) 15 MG TABLET leucovorin (WELLCOVORIN) 15 MG tablet      Take 1 tablet (15 mg total) by mouth daily.    Take 1 tablet (15 mg total) by mouth daily.   PYRIMETHAMINE (DARAPRIM) 25 MG TABLET pyrimethamine (DARAPRIM) 25 MG tablet      Take 3 tablets (75 mg total) by mouth daily with breakfast.    Take 3 tablets (75 mg total) by mouth daily with breakfast.  Discontinued Medications   DARUNAVIR-COBICISCTAT-EMTRICITABINE-TENOFOVIR ALAFENAMIDE (SYMTUZA) 800-150-200-10 MG TABS    Take 1 tablet by mouth daily with breakfast.   DOLUTEGRAVIR (TIVICAY) 50 MG TABLET    Take 1 tablet (50 mg total) by mouth daily.    Allergies: Allergies  Allergen Reactions  . Hydrocodone Itching and Nausea Only    Tolerates Oxycodone  . Tramadol Itching and Nausea Only    Tolerates oxycodone  Past Medical History: Past Medical History:  Diagnosis Date  . 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 Adrenal insufficiency ([place])   . Anemia of chronic disease 03/11/2012  . Back pain of lumbar region with sciatica 02/12/2015  . Bell's palsy 08/26/2013  . Brain lesion   . 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 Chronic back pain   . Chronic leg pain    bilateral knees, ankles  . Depression   . Fatigue   . GERD (gastroesophageal reflux disease)   . Headache   . 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 HIV (human immunodeficiency virus infection) ([place])  02/2012  . Laceration of ankle, right 11/18/2012  . Lumbar radiculopathy   . Meningitis 02/18/2018  . Pelvic pain   . PID (acute pelvic inflammatory disease) 02/26/2018  . Pneumonia   . Reflux esophagitis 03/11/2012  . Seizure ([place])   . Tuberculosis   . 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 Vertigo   . Wears glasses     Social History: Social History   Socioeconomic History  . Marital status: Married    Spouse name: Not on file  . Number of children: 0  . Years of education: college  . Highest education level: High school graduate  Occupational History  . Occupation: CNA  Tobacco Use  . Smoking status: Never Smoker  . Smokeless tobacco: Never Used  Vaping Use  . Vaping Use: Never used  Substance and Sexual Activity  . Alcohol use: Not Currently    Alcohol/week: 0.0 standard drinks    Comment: socially  . Drug use: No  . Sexual activity: Yes    Partners: Male    Birth control/protection: None    Comment: pt. given condoms 11/2019  Other Topics Concern  . 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     Drinks about 1 soda a day    Social Determinants of Radio broadcast assistant Strain:   . Difficulty of Paying Living Expenses:   Food Insecurity:   . Worried About Charity fundraiser in the Last Year:   . Arboriculturist in the Last Year:   Transportation Needs:   . Film/video editor (Medical):   [name] Lack of Transportation (Non-Medical):   Physical Activity:   . Days of Exercise per Week:   . Minutes of Exercise per Session:   Stress:   . Feeling of Stress :   Social Connections:   . Frequency of Communication with Friends and Family:   . Frequency of Social Gatherings with Friends and Family:   . Attends Religious Services:   . Active Member of Clubs or Organizations:   . Attends Archivist Meetings:   [name] Marital Status:     Labs: Lab Results  Component Value Date   HIV1RNAQUANT 201,000  05/10/2020   HIV1RNAQUANT 693 (H) 03/07/2020   HIV1RNAQUANT 131,000 02/05/2020   CD4TABS 107 (L) 05/10/2020   CD4TABS 45 (L) 03/07/2020   CD4TABS 36 (L) 11/18/2019    RPR and STI Lab Results  Component Value Date   LABRPR Reactive (A) 10/29/2019   LABRPR REACTIVE (A) 04/28/2019   LABRPR Reactive (A) 12/31/2018   LABRPR Reactive (A) 02/18/2018   LABRPR REACTIVE (A) 11/17/2017   RPRTITER 1:2 (H) 04/28/2019   RPRTITER 1:4 (H) 11/17/2017   RPRTITER 1:16 (H) 09/01/2017   RPRTITER 1:1 05/23/2016    STI Results GC CT  08/09/2018 Negative Negative  02/18/2018 Negative Negative  02/12/2018 Negative **POSITIVE**(A)  11/20/2017 Negative Negative  05/23/2016 Negative Negative  01/23/2015 NG: Negative CT: Negative    Hepatitis B Lab Results  Component Value Date   HEPBSAB POS (A) 09/13/2015   HEPBSAG NEGATIVE 03/13/2012   HEPBCAB NEGATIVE 03/13/2012   Hepatitis C No results found for: HEPCAB, HCVRNAPCRQN Hepatitis A Lab Results  Component Value Date   HAV POSITIVE (A) 03/13/2012   Lipids: Lab Results  Component Value Date   CHOL 215 (H) 12/25/2016   TRIG 144 12/25/2016   HDL 67 12/25/2016   CHOLHDL 3.2 12/25/2016   VLDL 29 12/25/2016   LDLCALC 119 (H) 12/25/2016    Current HIV Regimen: Symtuza + Tivicay  Assessment:  Tomara is here today for a pill box refill and to see how she is doing on her CNS toxoplasmosis medications. I saw her last week and filled a week's worth of medications for her in her pill box.  She brought them with her today and had several days where she did not take all of her doses. She is supposed to take her sulfadiazine four times per day but did not take her dinner and bedtime dose on Sunday; her dinner dose on Monday; her dinner and bedtime dose on Tuesday; her noon and dinner dose on Wednesday; or her the dinner dose on Friday. She took all of her doses on Thursday and Saturday only.   She states that she is having heartburn when she is taking her  medications and that is why she is having trouble taking her dinner and night time doses. She is taking some Tums but is getting constipated from them and having to take miralax. Dr. Baxter Flattery also saw her today, and we reiterated the importance of taking these like they are prescribed. She asked if she could crush them and put them in pudding, but we asked her not to as this will affect the absorption. She will take them with pudding, applesauce, or honey but will take them whole and not crush them.   She is taking her morning dose around 9am, her noon dose around 3pm, her dinner dose around 8pm, and her night time dose around 11pm.  I think she may be having the night time issues because her dinner time and night time doses are so close together.  I asked her to take her noon dose around noon, to move her dinner dose to around 6pm, and to continue taking her night tine dose around 11pm.  I refilled her pillbox for her and she will see me again next week. She asked Dr. Baxter Flattery if she could stop Symtuza and we agreed to change he to [place]. We advised her of the importance of not missing doses on the Biktarvy and being very adherent. She is excited to have a smaller pill to take.  She states she will try her best to take all of her doses every day.   Dr. Baxter Flattery also thinks she may have some athlete's foot and will prescribe her some cream to use.   Plan: - Stop Symtuza and [place] once daily - Continue pyrimethamine, leucovorin, and sulfadiazine for 6 weeks - See me wekly for pill box fills - Terbinafine 1% cream once daily for foot - F/u with me next week on 8/18   Haeley Fordham L. Jerni Selmer, PharmD, BCIDP, AAHIVP, CPP Clinical Pharmacist Practitioner [place] for Infectious Disease 07/25/2020, 3:52 PM

## 2020-07-25 NOTE — Telephone Encounter (Signed)
[facility] Patient Advocate Encounter   Was successful in obtaining a [employer] copay card for [facility].  This copay card will make the patients copay $0.  I have spoken with the patient.    The billing information is as follows and has been shared with [place].   Kelsey Rodriguez. Kelsey Rodriguez Kelsey [place] Patient [facility] for Infectious Disease Phone: [phone] Fax:  [phone]

## 2020-07-26 MED FILL — BIKTARVY 50-200-25 MG TABS: 50-200-25 | 30 days supply | Qty: 30 | Fill #0

## 2020-07-29 ENCOUNTER — Other Ambulatory Visit: Payer: Medicaid Other

## 2020-07-30 MED FILL — LEUCOVORIN CALCIUM 15 MG TA: 15 | 20 days supply | Qty: 20 | Fill #0

## 2020-07-31 ENCOUNTER — Telehealth: Payer: Self-pay | Admitting: Pharmacy Technician

## 2020-07-31 NOTE — Telephone Encounter (Signed)
[facility] Patient Advocate Encounter  Patient's leucovorin calcium has been couriered to [facility] from [facility] and is ready for pick up.  Venida Jarvis. Nadara Mustard [place] Patient [facility] for Infectious Disease Phone: [phone] Fax:  [phone]

## 2020-08-01 ENCOUNTER — Telehealth: Payer: Self-pay

## 2020-08-01 ENCOUNTER — Ambulatory Visit
Admission: RE | Admit: 2020-08-01 | Discharge: 2020-08-01 | Disposition: A | Discharge status: Home / Self Care | Payer: Medicaid Other | Source: Ambulatory Visit | Attending: Infectious Diseases | Admitting: Infectious Diseases

## 2020-08-01 ENCOUNTER — Ambulatory Visit: Payer: Medicaid Other | Admitting: Pharmacist

## 2020-08-01 ENCOUNTER — Other Ambulatory Visit: Payer: Self-pay

## 2020-08-01 DIAGNOSIS — G936 Cerebral edema: Secondary | ICD-10-CM | POA: Diagnosis not present

## 2020-08-01 DIAGNOSIS — B20 Human immunodeficiency virus [HIV] disease: Secondary | ICD-10-CM

## 2020-08-01 DIAGNOSIS — J322 Chronic ethmoidal sinusitis: Secondary | ICD-10-CM | POA: Diagnosis not present

## 2020-08-01 DIAGNOSIS — R900 Intracranial space-occupying lesion found on diagnostic imaging of central nervous system: Secondary | ICD-10-CM

## 2020-08-01 DIAGNOSIS — J0120 Acute ethmoidal sinusitis, unspecified: Secondary | ICD-10-CM | POA: Diagnosis not present

## 2020-08-01 DIAGNOSIS — G9389 Other specified disorders of brain: Secondary | ICD-10-CM | POA: Diagnosis not present

## 2020-08-01 MED ORDER — GADOBENATE DIMEGLUMINE 529 MG/ML IV SOLN
17.0000 mL | Freq: Once | INTRAVENOUS | Status: AC | PRN
  Administered 2020-08-01: 17 mL via INTRAVENOUS

## 2020-08-01 NOTE — Telephone Encounter (Signed)
STAT report called to office.  IMPRESSION: Interval right temporal craniotomy and biopsy/partial resection of a right temporal lobe lesion. There is only subtle residual ill-defined parenchymal enhancement at this site. There is also mild residual T2 hyperintensity at this site which likely reflects a combination of postoperative gliosis and edema. Additionally, there is mild focal overlying dural thickening and enhancement which is nonspecific and may be postoperative.

## 2020-08-02 ENCOUNTER — Ambulatory Visit: Payer: Medicaid Other

## 2020-08-02 ENCOUNTER — Telehealth: Payer: Self-pay | Admitting: Pharmacist

## 2020-08-02 NOTE — Telephone Encounter (Signed)
Patient no showed appt yesterday with me to get her CNS toxoplasmosis medications. I called her twice with no answer. Left VM.   Dr. Baxter Flattery - she also had a MRI done yesterday that showed a new lesion. Just wanted you to be aware.

## 2020-08-02 NOTE — Telephone Encounter (Signed)
Thanks Shaquenia.  I have reviewed the MRI and sent a note to the [facility] pool and you.  Thanks jeff

## 2020-08-06 ENCOUNTER — Other Ambulatory Visit: Payer: Self-pay

## 2020-08-06 ENCOUNTER — Ambulatory Visit (INDEPENDENT_AMBULATORY_CARE_PROVIDER_SITE_OTHER): Payer: Medicaid Other | Admitting: Pharmacist

## 2020-08-06 DIAGNOSIS — B582 Toxoplasma meningoencephalitis: Secondary | ICD-10-CM

## 2020-08-06 DIAGNOSIS — R21 Rash and other nonspecific skin eruption: Secondary | ICD-10-CM | POA: Diagnosis not present

## 2020-08-06 DIAGNOSIS — B20 Human immunodeficiency virus [HIV] disease: Secondary | ICD-10-CM

## 2020-08-06 MED FILL — PYRIMETHAMINE 25 MG TABS: 25 | 13 days supply | Qty: 40 | Fill #0

## 2020-08-06 NOTE — Progress Notes (Signed)
HPI: Kelsey Rodriguez is a 29 y.o. female who presents to the [facility] clinic for HIV follow-up.  Patient Active Problem List   Diagnosis Date Noted  . S/P craniotomy 05/16/2020  . Brain tumor ([place]) 05/16/2020  . 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 Hypertension   . Pain of upper abdomen   . Suicide ideation   . 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[place])   . Seizure ([place])   . Intracranial mass   . Intractable headache 02/04/2020  . Encephalitis, myelitis, 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[place]) 01/31/2020  . Rotator cuff strain 01/26/2020  . 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 AIDS (acquired immune deficiency syndrome) ([place]) 11/07/2019  . Cerebral edema ([place]) 10/28/2019  . Midline shift of brain   . Pruritus 08/29/2019  . Tendinopathy of left shoulder 01/18/2019  . Chronic pelvic pain in female 01/04/2019  . Lower abdominal pain 06/21/2018  . Syphilis 02/26/2018  . Tuberculosis   . Infertility, female 02/12/2018  . 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 Acute right-sided low back pain with right-sided sciatica 08/24/2017  . Complex regional pain syndrome 02/03/2017  . Headache 10/28/2016  . Avascular necrosis of bone of right hip ([place]) 04/04/2016  . 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 Avascular necrosis of bone of left hip ([place]) 12/14/2015  . 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 Nausea 05/18/2015  . Vertigo 01/23/2015  . Primary adrenal insufficiency ([place]) 01/03/2015  . Chest pain 07/07/2013  . HIV (human immunodeficiency virus infection) ([place]) 03/16/2012  . 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   BICTEGRAVIR-EMTRICITABINE-TENOFOVIR AF (BIKTARVY) 50-200-25 MG TABS TABLET     Take 1 tablet by mouth daily.   LEUCOVORIN (WELLCOVORIN) 15 MG TABLET    Take 1 tablet (15 mg total) by mouth daily.   PYRIMETHAMINE (DARAPRIM) 25 MG TABLET    Take 3 tablets (75 mg total) by mouth daily with breakfast.   SULFADIAZINE 500 MG TABLET    Take 3 tablets (1,500 mg total) by mouth 4 (four) times daily.   SULFAMETHOXAZOLE-TRIMETHOPRIM (BACTRIM DS) 800-160 MG TABLET    Take 1 tablet by mouth daily.   TERBINAFINE (LAMISIL AT) 1 % CREAM    Apply 1 application topically 2 (two) times daily.  Modified Medications   No medications on file  Discontinued Medications   DOCUSATE SODIUM (COLACE) 100 MG CAPSULE    Take 1 capsule (100 mg total) by mouth 2 (two) times daily.   LEVETIRACETAM (KEPPRA) 500 MG TABLET    Take 1 tablet (500 mg total) by mouth 2 (two) times daily.    Allergies: Allergies  Allergen Reactions  . Hydrocodone Itching and Nausea Only    Tolerates Oxycodone  . Tramadol Itching and Nausea Only    Tolerates oxycodone    Past Medical History: Past Medical History:  Diagnosis Date  . 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 Adrenal insufficiency ([place])   . Anemia of chronic disease 03/11/2012  . Back pain of lumbar region with sciatica 02/12/2015  . Bell's palsy 08/26/2013  . Brain lesion   . 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 Chronic back pain   . Chronic leg pain    bilateral knees, ankles  . Depression   .  Fatigue   . GERD (gastroesophageal reflux disease)   . Headache   . 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 HIV (human immunodeficiency virus infection) ([place]) 02/2012  . Laceration of ankle, right 11/18/2012  . Lumbar radiculopathy   . Meningitis 02/18/2018  . Pelvic pain   . PID (acute pelvic inflammatory disease) 02/26/2018  . Pneumonia   . Reflux esophagitis 03/11/2012  . Seizure ([place])   . Tuberculosis   . 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 Vertigo   . Wears glasses     Social History: Social History   Socioeconomic History  . Marital status:  Married    Spouse name: Not on file  . Number of children: 0  . Years of education: college  . Highest education level: High school graduate  Occupational History  . Occupation: CNA  Tobacco Use  . Smoking status: Never Smoker  . Smokeless tobacco: Never Used  Vaping Use  . Vaping Use: Never used  Substance and Sexual Activity  . Alcohol use: Not Currently    Alcohol/week: 0.0 standard drinks    Comment: socially  . Drug use: No  . Sexual activity: Yes    Partners: Male    Birth control/protection: None    Comment: pt. given condoms 11/2019  Other Topics Concern  . 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     Drinks about 1 soda a day    Social Determinants of Health   Financial Resource Strain:   . Difficulty of Paying Living Expenses: Not on file  Food Insecurity:   . Worried About Charity fundraiser in the Last Year: Not on file  . Ran Out of Food in the Last Year: Not on file  Transportation Needs:   . Lack of Transportation (Medical): Not on file  . Lack of Transportation (Non-Medical): Not on file  Physical Activity:   . Days of Exercise per Week: Not on file  . Minutes of Exercise per Session: Not on file  Stress:   . Feeling of Stress : Not on file  Social Connections:   . Frequency of Communication with Friends and Family: Not on file  . Frequency of Social Gatherings with Friends and Family: Not on file  . Attends Religious Services: Not on file  . Active Member of Clubs or Organizations: Not on file  . Attends Archivist Meetings: Not on file  . Marital Status: Not on file    Labs: Lab Results  Component Value Date   HIV1RNAQUANT 201,000 05/10/2020   HIV1RNAQUANT 693 (H) 03/07/2020   HIV1RNAQUANT 131,000 02/05/2020   CD4TABS 107 (L) 05/10/2020   CD4TABS 45 (L) 03/07/2020   CD4TABS 36 (L) 11/18/2019    RPR and STI Lab Results  Component Value Date   LABRPR Reactive (A) 10/29/2019   LABRPR  REACTIVE (A) 04/28/2019   LABRPR Reactive (A) 12/31/2018   LABRPR Reactive (A) 02/18/2018   LABRPR REACTIVE (A) 11/17/2017   RPRTITER 1:2 (H) 04/28/2019   RPRTITER 1:4 (H) 11/17/2017   RPRTITER 1:16 (H) 09/01/2017   RPRTITER 1:1 05/23/2016    STI Results GC CT  08/09/2018 Negative Negative  02/18/2018 Negative Negative  02/12/2018 Negative **POSITIVE**(A)  11/20/2017 Negative Negative  05/23/2016 Negative Negative  01/23/2015 NG: Negative CT: Negative    Hepatitis B Lab Results  Component Value Date   HEPBSAB POS (A) 09/13/2015   HEPBSAG NEGATIVE 03/13/2012   HEPBCAB NEGATIVE  03/13/2012   Hepatitis C No results found for: HEPCAB, HCVRNAPCRQN Hepatitis A Lab Results  Component Value Date   HAV POSITIVE (A) 03/13/2012   Lipids: Lab Results  Component Value Date   CHOL 215 (H) 12/25/2016   TRIG 144 12/25/2016   HDL 67 12/25/2016   CHOLHDL 3.2 12/25/2016   VLDL 29 12/25/2016   LDLCALC 119 (H) 12/25/2016    Current HIV Regimen: Biktarvy  Assessment: Carry is here today so I can refill her pill box and follow up on switching her HIV medication to [employer].  She was initially scheduled to see me Thursday 8/19 but no showed. I called her several times but could not reach her. She states that she did not have a ride. She was able to get an uber here today. She does not feel safe driving anymore as her vision is getting worse and she states that she cannot "recognize her surroundings or things around her". She is worried her symptoms are getting worse. She is having more headaches, especially at night. She is asking about her MRI that she had done last week. It showed:  Interval right temporal craniotomy and biopsy/partial resection of a right temporal lobe lesion. There is only subtle residual ill-defined parenchymal enhancement at this site. There is also mild residual T2 hyperintensity at this site which likely reflects a combination of postoperative gliosis and edema.  Additionally, there is mild focal overlying dural thickening and enhancement which is nonspecific and may be postoperative.  However, there is a new 1.4 cm peripherally enhancing parenchymal lesion within the right occipital pole with mild-to-moderate surrounding edema as described. Favored differential considerations include toxoplasmosis or other infectious encephalitis versus Lymphoma.  She sees neurology tomorrow.  She ran out of medication on Thursday and because she could not find a ride, she was out all weekend. She stated that she had 3 doses left that she had missed during the week that she took randomly this past weekend. She still doesn't seem to grasp the seriousness of this infection. I refilled her pill box for a week's worth of doses. I will have her come back Monday so I can refill it again and she will also see Dr. Baxter Flattery Monday at [facility].  She started taking Biktarvy last Friday and states that she likes it much better than her previous pills. She takes it at night and hasnt missed a dose since starting. She asked me to fill out a sheet with her medications and indications to help get back her social security back.   She also started using the terbinafine cream on her feet last week and thinks it is helping a lot. Will see her back next week.  Plan: - Continue Biktarvy - Continue pyrimethamine, leucovorin, and sulfadiazine x 6 weeks (at least) - F/u with me again Monday 8/30  - F/u with Dr. Baxter Flattery Monday 8/30 at [facility]. Lily Kernen, PharmD, BCIDP, AAHIVP, CPP Clinical Pharmacist Practitioner Infectious Diseases [facility] 08/06/2020, 4:38 PM

## 2020-08-07 ENCOUNTER — Encounter: Payer: Medicaid Other | Admitting: Adult Health

## 2020-08-07 ENCOUNTER — Encounter: Payer: Self-pay | Admitting: Adult Health

## 2020-08-08 ENCOUNTER — Telehealth: Payer: Self-pay | Admitting: Pharmacy Technician

## 2020-08-08 NOTE — Telephone Encounter (Signed)
[facility] Patient Advocate Encounter  Patient's medications have been couriered to [facility] from [facility] and will be picked up 08/13/2020 at appointment.  Kelsey Rodriguez. Kelsey Rodriguez Patient [facility] Infect[facility] Disease Phone: [phone] Fax:  [phone]

## 2020-08-13 ENCOUNTER — Other Ambulatory Visit: Payer: Self-pay

## 2020-08-13 ENCOUNTER — Encounter: Payer: Self-pay | Admitting: Internal Medicine

## 2020-08-13 ENCOUNTER — Ambulatory Visit (INDEPENDENT_AMBULATORY_CARE_PROVIDER_SITE_OTHER): Payer: Medicaid Other | Admitting: Pharmacist

## 2020-08-13 ENCOUNTER — Ambulatory Visit (INDEPENDENT_AMBULATORY_CARE_PROVIDER_SITE_OTHER): Payer: Medicaid Other | Admitting: Internal Medicine

## 2020-08-13 VITALS — BP 132/84 | HR 95 | Temp 98.6°F | Wt 184.0 lb

## 2020-08-13 DIAGNOSIS — Z79899 Other long term (current) drug therapy: Secondary | ICD-10-CM | POA: Diagnosis not present

## 2020-08-13 DIAGNOSIS — B20 Human immunodeficiency virus [HIV] disease: Secondary | ICD-10-CM

## 2020-08-13 DIAGNOSIS — Z9119 Patient's noncompliance with other medical treatment and regimen: Secondary | ICD-10-CM

## 2020-08-13 DIAGNOSIS — B582 Toxoplasma meningoencephalitis: Secondary | ICD-10-CM

## 2020-08-13 DIAGNOSIS — R21 Rash and other nonspecific skin eruption: Secondary | ICD-10-CM

## 2020-08-13 DIAGNOSIS — B3731 Acute candidiasis of vulva and vagina: Secondary | ICD-10-CM

## 2020-08-13 DIAGNOSIS — K219 Gastro-esophageal reflux disease without esophagitis: Secondary | ICD-10-CM

## 2020-08-13 DIAGNOSIS — Z91199 Patient's noncompliance with other medical treatment and regimen due to unspecified reason: Secondary | ICD-10-CM

## 2020-08-13 DIAGNOSIS — B373 Candidiasis of vulva and vagina: Secondary | ICD-10-CM | POA: Diagnosis not present

## 2020-08-13 MED ORDER — TERBINAFINE HCL 1 % EX CREA: 1.0000 "application " | TOPICAL_CREAM | Freq: Two times a day (BID) | CUTANEOUS | 3 refills | Status: DC

## 2020-08-13 MED ORDER — SUCRALFATE 1 GM/10ML PO SUSP: 1.0000 g | Freq: Three times a day (TID) | ORAL | 3 refills | Status: DC

## 2020-08-13 MED ORDER — CLOTRIMAZOLE 2 % VA CREA: 1.0000 | TOPICAL_CREAM | Freq: Every day | VAGINAL | 0 refills | Status: DC

## 2020-08-13 MED ORDER — TERBINAFINE HCL 1 % EX CREA: 1.0000 "application " | TOPICAL_CREAM | Freq: Two times a day (BID) | CUTANEOUS | 1 refills | Status: DC

## 2020-08-13 NOTE — Progress Notes (Signed)
RFV: follow up for hiv disease, CNS toxo  Patient ID: Kelsey Rodriguez, female   DOB: November 25, 1991, 29 y.o.   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 She reports still having ongoing Blurry vision/floaters. Has seen ophtho. No weakness.  Outpatient Encounter Medications as of 08/13/2020  Medication Sig  . amitriptyline (ELAVIL) 10 MG tablet Take 3 tablets (30 mg total) by mouth at bedtime.  . bictegravir-emtricitabine-tenofovir AF (BIKTARVY) 50-200-25 MG TABS tablet Take 1 tablet by mouth daily.  [name] leucovorin (WELLCOVORIN) 15 MG tablet Take 1 tablet (15 mg total) by mouth daily.  [name] pyrimethamine (DARAPRIM) 25 MG tablet Take 3 tablets (75 mg total) by mouth daily with breakfast.  . sulfaDIAZINE 500 MG tablet Take 3 tablets (1,500 mg total) by mouth 4 (four) times daily.  [name] sulfamethoxazole-trimethoprim (BACTRIM DS) 800-160 MG tablet Take 1 tablet by mouth daily.  [name] terbinafine (LAMISIL AT) 1 % cream Apply 1 application topically 2 (two) times daily.  . [DISCONTINUED] SUMAtriptan (IMITREX) 50 MG tablet Take 1 tablet (50 mg total) by mouth every 2 (two) hours as needed for migraine (Maximum dose: 100 mg per dose; 200 mg per 24 hours). May repeat in 2 hours if headache persists or recurs.   No facility-administered encounter medications on file as of 08/13/2020.     Patient Active Problem List   Diagnosis Date Noted  . S/P craniotomy 05/16/2020  . Brain tumor ([place]) 05/16/2020  . 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 Hypertension   . Pain of upper abdomen   . Suicide ideation   . 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[place])   . Seizure ([place])   . Intracranial mass   . Intractable headache 02/04/2020  . Encephalitis, myelitis, 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[place]) 01/31/2020  . Rotator cuff strain 01/26/2020  . 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 AIDS (acquired immune  deficiency syndrome) ([place]) 11/07/2019  . Cerebral edema ([place]) 10/28/2019  . Midline shift of brain   . Pruritus 08/29/2019  . Tendinopathy of left shoulder 01/18/2019  . Chronic pelvic pain in female 01/04/2019  . Lower abdominal pain 06/21/2018  . Syphilis 02/26/2018  . Tuberculosis   . Infertility, female 02/12/2018  . 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 Acute right-sided low back pain with right-sided sciatica 08/24/2017  . Complex regional pain syndrome 02/03/2017  . Headache 10/28/2016  . Avascular necrosis of bone of right hip ([place]) 04/04/2016  . 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 Avascular necrosis of bone of left hip ([place]) 12/14/2015  . 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 Nausea 05/18/2015  . Vertigo 01/23/2015  . Primary adrenal insufficiency ([place]) 01/03/2015  . Chest pain 07/07/2013  . HIV (human immunodeficiency virus infection) ([place]) 03/16/2012  . 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 INFLUENZA VACCINE  07/15/2020     Review of Systems Chills, arthralgia Physical Exam   BP 132/84   Pulse 95   Temp 98.6 F (37 C)   Wt 184 lb (83.5 kg)   SpO2 99%   BMI 32.59 kg/m   Physical Exam  Constitutional:  oriented to person, place, and time. appears well-developed and well-nourished. No distress.  HENT: [place]/AT, PERRLA, no scleral icterus Mouth/Throat: Oropharynx is clear and moist. No oropharyngeal exudate.  Cardiovascular: Normal rate, regular  rhythm and normal heart sounds. Exam reveals no gallop and no friction rub.  No murmur heard.  Pulmonary/Chest: Effort normal and breath sounds normal. No respiratory distress.  has no wheezes.  Neck = supple, no nuchal rigidity Abdominal: Soft. Bowel sounds are normal.  exhibits no distension. There is no tenderness.  Lymphadenopathy: no cervical adenopathy. No axillary adenopathy Neurological: alert  and oriented to person, place, and time.  Skin: Skin is warm and dry. No rash noted. No erythema.  Psychiatric: a normal mood and affect.  behavior is normal.   Lab Results  Component Value Date   CD4TCELL 6 (L) 05/10/2020   Lab Results  Component Value Date   CD4TABS 107 (L) 05/10/2020   CD4TABS 45 (L) 03/07/2020   CD4TABS 36 (L) 11/18/2019   Lab Results  Component Value Date   HIV1RNAQUANT 201,000 05/10/2020   Lab Results  Component Value Date   HEPBSAB POS (A) 09/13/2015   Lab Results  Component Value Date   LABRPR Reactive (A) 10/29/2019    CBC Lab Results  Component Value Date   WBC 2.9 (L) 07/15/2020   RBC 3.76 (L) 07/15/2020   HGB 11.3 (L) 07/15/2020   HCT 33.9 (L) 07/15/2020   PLT 211 07/15/2020   MCV 90.2 07/15/2020   MCH 30.1 07/15/2020   MCHC 33.3 07/15/2020   RDW 17.2 (H) 07/15/2020   LYMPHSABS 1.1 07/11/2020   MONOABS 0.3 07/11/2020   EOSABS 0.4 07/11/2020    BMET Lab Results  Component Value Date   NA 139 07/12/2020   K 4.0 07/12/2020   CL 108 07/12/2020   CO2 22 07/12/2020   GLUCOSE 89 07/12/2020   BUN 7 07/12/2020   CREATININE 0.66 07/12/2020   CALCIUM 8.8 (L) 07/12/2020   GFRNONAA >60 07/12/2020   GFRAA >60 07/12/2020      Assessment and Plan  CNS toxo = unfortunately, don't think  there any IV alternatives to consider as an outpatient. Not able to justify getting her admitted at this time. "stable " -if clinically worsens, will admit. Spent much of the time doing adherence counseling   Headache = likely from CNS toxo. Not having worsening  Features. Will continue with prn ibuprofen  HIV disease = will check labs to see if better controlled  Vaginal candidiasis = likely from antibiotics, will give clotrimazol cream  Foot rash = Continue on lamisil cream  Health maintenance = covid-19 vaccine, --Think about getting vaccine  Spent 35 min with patient

## 2020-08-13 NOTE — Progress Notes (Signed)
This encounter was created in error - please disregard.

## 2020-08-13 NOTE — Progress Notes (Signed)
HPI: Kelsey Rodriguez is a 29 y.o. 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  Patient Active Problem List   Diagnosis Date Noted  . S/P craniotomy 05/16/2020  . Brain tumor ([place]) 05/16/2020  . 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 Hypertension   . Pain of upper abdomen   . Suicide ideation   . 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[place])   . Seizure ([place])   . Intracranial mass   . Intractable headache 02/04/2020  . Encephalitis, myelitis, 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[place]) 01/31/2020  . Rotator cuff strain 01/26/2020  . 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 AIDS (acquired immune deficiency syndrome) ([place]) 11/07/2019  . Cerebral edema ([place]) 10/28/2019  . Midline shift of brain   . Pruritus 08/29/2019  . Tendinopathy of left shoulder 01/18/2019  . Chronic pelvic pain in female 01/04/2019  . Lower abdominal pain 06/21/2018  . Syphilis 02/26/2018  . Tuberculosis   . Infertility, female 02/12/2018  . 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 Acute right-sided low back pain with right-sided sciatica 08/24/2017  . Complex regional pain syndrome 02/03/2017  . Headache 10/28/2016  . Avascular necrosis of bone of right hip ([place]) 04/04/2016  . 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 Avascular necrosis of bone of left hip ([place]) 12/14/2015  . 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 Nausea 05/18/2015  . Vertigo 01/23/2015  . Primary adrenal insufficiency ([place]) 01/03/2015  . Chest pain 07/07/2013  . HIV (human immunodeficiency virus infection) ([place]) 03/16/2012  . 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   SUCRALFATE (CARAFATE) 1 GM/10ML SUSPENSION    Take 10 mLs (1 g  total) by mouth 4 (four) times daily -  with meals and at bedtime.  Previous Medications   AMITRIPTYLINE (ELAVIL) 10 MG TABLET    Take 3 tablets (30 mg total) by mouth at bedtime.   BICTEGRAVIR-EMTRICITABINE-TENOFOVIR AF (BIKTARVY) 50-200-25 MG TABS TABLET    Take 1 tablet by mouth daily.   LEUCOVORIN (WELLCOVORIN) 15 MG TABLET    Take 1 tablet (15 mg total) by mouth daily.   PYRIMETHAMINE (DARAPRIM) 25 MG TABLET    Take 3 tablets (75 mg total) by mouth daily with breakfast.   SULFADIAZINE 500 MG TABLET    Take 3 tablets (1,500 mg total) by mouth 4 (four) times daily.   SULFAMETHOXAZOLE-TRIMETHOPRIM (BACTRIM DS) 800-160 MG TABLET    Take 1 tablet by mouth daily.  Modified Medications   Modified Medication Previous Medication   TERBINAFINE (LAMISIL AT) 1 % CREAM terbinafine (LAMISIL AT) 1 % cream      Apply 1 application topically 2 (two) times daily.    Apply 1 application topically 2 (two) times daily.  Discontinued Medications   No medications on file    Allergies: Allergies  Allergen Reactions  . Hydrocodone Itching and Nausea Only    Tolerates Oxycodone  . Tramadol Itching and Nausea Only    Tolerates oxycodone    Past Medical History: Past Medical History:  Diagnosis Date  . 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 Adrenal insufficiency ([place])   . Anemia of chronic disease 03/11/2012  . Back pain of lumbar region with sciatica 02/12/2015  . Bell's palsy 08/26/2013  .  Brain lesion   . 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 Chronic back pain   . Chronic leg pain    bilateral knees, ankles  . Depression   . Fatigue   . GERD (gastroesophageal reflux disease)   . Headache   . 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 HIV (human immunodeficiency virus infection) ([place]) 02/2012  . Laceration of ankle, right 11/18/2012  . Lumbar radiculopathy   . Meningitis 02/18/2018  . Pelvic pain   . PID (acute pelvic inflammatory disease) 02/26/2018  . Pneumonia   . Reflux esophagitis 03/11/2012  . Seizure ([place])    . Tuberculosis   . 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 Vertigo   . Wears glasses     Social History: Social History   Socioeconomic History  . Marital status: Married    Spouse name: Not on file  . Number of children: 0  . Years of education: college  . Highest education level: High school graduate  Occupational History  . Occupation: CNA  Tobacco Use  . Smoking status: Never Smoker  . Smokeless tobacco: Never Used  Vaping Use  . Vaping Use: Never used  Substance and Sexual Activity  . Alcohol use: Not Currently    Alcohol/week: 0.0 standard drinks    Comment: socially  . Drug use: No  . Sexual activity: Yes    Partners: Male    Birth control/protection: None    Comment: pt. given condoms 11/2019  Other Topics Concern  . 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     Drinks about 1 soda a day    Social Determinants of Health   Financial Resource Strain:   . Difficulty of Paying Living Expenses: Not on file  Food Insecurity:   . Worried About Charity fundraiser in the Last Year: Not on file  . Ran Out of Food in the Last Year: Not on file  Transportation Needs:   . Lack of Transportation (Medical): Not on file  . Lack of Transportation (Non-Medical): Not on file  Physical Activity:   . Days of Exercise per Week: Not on file  . Minutes of Exercise per Session: Not on file  Stress:   . Feeling of Stress : Not on file  Social Connections:   . Frequency of Communication with Friends and Family: Not on file  . Frequency of Social Gatherings with Friends and Family: Not on file  . Attends Religious Services: Not on file  . Active Member of Clubs or Organizations: Not on file  . Attends Archivist Meetings: Not on file  . Marital Status: Not on file    Labs: Lab Results  Component Value Date   HIV1RNAQUANT 201,000 05/10/2020   HIV1RNAQUANT 693 (H) 03/07/2020   HIV1RNAQUANT 131,000 02/05/2020    CD4TABS 107 (L) 05/10/2020   CD4TABS 45 (L) 03/07/2020   CD4TABS 36 (L) 11/18/2019    RPR and STI Lab Results  Component Value Date   LABRPR Reactive (A) 10/29/2019   LABRPR REACTIVE (A) 04/28/2019   LABRPR Reactive (A) 12/31/2018   LABRPR Reactive (A) 02/18/2018   LABRPR REACTIVE (A) 11/17/2017   RPRTITER 1:2 (H) 04/28/2019   RPRTITER 1:4 (H) 11/17/2017   RPRTITER 1:16 (H) 09/01/2017   RPRTITER 1:1 05/23/2016    STI Results GC CT  08/09/2018 Negative Negative  02/18/2018 Negative Negative  02/12/2018 Negative **POSITIVE**(A)  11/20/2017 Negative Negative  05/23/2016 Negative Negative  01/23/2015  NG: Negative CT: Negative    Hepatitis B Lab Results  Component Value Date   HEPBSAB POS (A) 09/13/2015   HEPBSAG NEGATIVE 03/13/2012   HEPBCAB NEGATIVE 03/13/2012   Hepatitis C No results found for: HEPCAB, HCVRNAPCRQN Hepatitis A Lab Results  Component Value Date   HAV POSITIVE (A) 03/13/2012   Lipids: Lab Results  Component Value Date   CHOL 215 (H) 12/25/2016   TRIG 144 12/25/2016   HDL 67 12/25/2016   CHOLHDL 3.2 12/25/2016   VLDL 29 12/25/2016   LDLCALC 119 (H) 12/25/2016    Current HIV Regimen: Biktarvy  Assessment: Kelsey Rodriguez is here today to see me for a pill box refill. 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 She saw Dr. Baxter Flattery today as well. She missed several doses of her sulfadiazine but overall took the majority of her doses. She is still experiencing some heartburn and stomach acid pains. I sent her in some sucralfate liquid to take before her night time dose. Asked her to separate this from her [place] as it can interact. She will take her Biktarvy in the morning.  Filled her pillbox for another week. We are closed next Monday, so I gave her an extra dose to cover her until she can see me Tuesday morning.  Plan: - Continue Biktarvy - Continue pyrimethamine, leucovorin, and sulfadiazine x 6 weeks - F/u with me again  Tuesday 9/7 at 1030am - F/u with me for last pill box fill 9/13 - F/u with Dr. Baxter Flattery Monday 9/13 at [place]. Eber Hong, PharmD, BCIDP, AAHIVP, CPP Clinical Pharmacist Practitioner Infectious Diseases [facility] 08/13/2020, 11:10 AM

## 2020-08-14 LAB — T-HELPER CELL (CD4) - (RCID CLINIC ONLY)
CD4 % Helper T Cell: 5 % — ABNORMAL LOW (ref 33–65)
CD4 T Cell Abs: 54 /uL — ABNORMAL LOW (ref 400–1790)

## 2020-08-15 DIAGNOSIS — Z419 Encounter for procedure for purposes other than remedying health state, unspecified: Secondary | ICD-10-CM | POA: Diagnosis not present

## 2020-08-15 NOTE — Telephone Encounter (Signed)
Received signed request from case manager for last office note/medication list. Faxed.

## 2020-08-16 ENCOUNTER — Ambulatory Visit: Payer: Medicaid Other

## 2020-08-16 LAB — COMPLETE METABOLIC PANEL WITH GFR
AG Ratio: 1.3 (calc) (ref 1.0–2.5)
ALT: 10 U/L (ref 6–29)
AST: 15 U/L (ref 10–30)
Albumin: 4.5 g/dL (ref 3.6–5.1)
Alkaline phosphatase (APISO): 92 U/L (ref 31–125)
BUN: 9 mg/dL (ref 7–25)
CO2: 23 mmol/L (ref 20–32)
Calcium: 9.7 mg/dL (ref 8.6–10.2)
Chloride: 107 mmol/L (ref 98–110)
Creat: 0.98 mg/dL (ref 0.50–1.10)
GFR, Est African American: 89 mL/min/{1.73_m2} (ref >=60)
GFR, Est Non African American: 77 mL/min/{1.73_m2} (ref >=60)
Globulin: 3.5 g/dL (calc) (ref 1.9–3.7)
Glucose, Bld: 83 mg/dL (ref 65–99)
Potassium: 4.2 mmol/L (ref 3.5–5.3)
Sodium: 139 mmol/L (ref 135–146)
Total Bilirubin: 0.4 mg/dL (ref 0.2–1.2)
Total Protein: 8 g/dL (ref 6.1–8.1)

## 2020-08-16 LAB — HIV-1 RNA QUANT-NO REFLEX-BLD
HIV 1 RNA Quant: 66400 Copies/mL — ABNORMAL HIGH
HIV-1 RNA Quant, Log: 4.82 Log cps/mL — ABNORMAL HIGH

## 2020-08-16 LAB — CBC WITH DIFFERENTIAL/PLATELET
Absolute Monocytes: 447 cells/uL (ref 200–950)
Basophils Absolute: 22 cells/uL (ref 0–200)
Basophils Relative: 0.5 %
Eosinophils Absolute: 224 cells/uL (ref 15–500)
Eosinophils Relative: 5.2 %
HCT: 34.7 % — ABNORMAL LOW (ref 35.0–45.0)
Hemoglobin: 11.6 g/dL — ABNORMAL LOW (ref 11.7–15.5)
Lymphs Abs: 1226 cells/uL (ref 850–3900)
MCH: 30.7 pg (ref 27.0–33.0)
MCHC: 33.4 g/dL (ref 32.0–36.0)
MCV: 91.8 fL (ref 80.0–100.0)
MPV: 11.3 fL (ref 7.5–12.5)
Monocytes Relative: 10.4 %
Neutro Abs: 2382 cells/uL (ref 1500–7800)
Neutrophils Relative %: 55.4 %
Platelets: 257 10*3/uL (ref 140–400)
RBC: 3.78 10*6/uL — ABNORMAL LOW (ref 3.80–5.10)
RDW: 15.7 % — ABNORMAL HIGH (ref 11.0–15.0)
Total Lymphocyte: 28.5 %
WBC: 4.3 10*3/uL (ref 3.8–10.8)

## 2020-08-16 NOTE — Progress Notes (Signed)
Patient was last seen by Dr. Baxter Flattery on 8/30, will return to the office on 9/13 for follow up. Unable to reach patient by phone or leave VM to return call.

## 2020-08-17 DIAGNOSIS — G4489 Other headache syndrome: Secondary | ICD-10-CM | POA: Diagnosis not present

## 2020-08-17 DIAGNOSIS — B589 Toxoplasmosis, unspecified: Secondary | ICD-10-CM | POA: Diagnosis not present

## 2020-08-17 DIAGNOSIS — Z0000 Encounter for general adult medical examination without abnormal findings: Secondary | ICD-10-CM | POA: Diagnosis not present

## 2020-08-17 DIAGNOSIS — B353 Tinea pedis: Secondary | ICD-10-CM | POA: Diagnosis not present

## 2020-08-17 DIAGNOSIS — B20 Human immunodeficiency virus [HIV] disease: Secondary | ICD-10-CM | POA: Diagnosis not present

## 2020-08-17 DIAGNOSIS — N9489 Other specified conditions associated with female genital organs and menstrual cycle: Secondary | ICD-10-CM | POA: Diagnosis not present

## 2020-08-17 DIAGNOSIS — N760 Acute vaginitis: Secondary | ICD-10-CM | POA: Diagnosis not present

## 2020-08-17 MED FILL — CLOTRIMAZOLE-BETAMETHASONE: 1-0.05 | 14 days supply | Qty: 30 | Fill #0

## 2020-08-21 ENCOUNTER — Ambulatory Visit (INDEPENDENT_AMBULATORY_CARE_PROVIDER_SITE_OTHER): Payer: Medicaid Other | Admitting: Pharmacist

## 2020-08-21 ENCOUNTER — Ambulatory Visit: Payer: Medicaid Other | Admitting: Pharmacist

## 2020-08-21 ENCOUNTER — Other Ambulatory Visit: Payer: Self-pay

## 2020-08-21 ENCOUNTER — Ambulatory Visit: Payer: Medicaid Other

## 2020-08-21 DIAGNOSIS — B20 Human immunodeficiency virus [HIV] disease: Secondary | ICD-10-CM | POA: Diagnosis not present

## 2020-08-21 DIAGNOSIS — B582 Toxoplasma meningoencephalitis: Secondary | ICD-10-CM

## 2020-08-21 MED FILL — BIKTARVY 50-200-25 MG TABS: 50-200-25 | 30 days supply | Qty: 30 | Fill #1

## 2020-08-21 MED FILL — SULFAMETHOXAZOLE-TMP DS TAB: 800-160 | 30 days supply | Qty: 30 | Fill #1

## 2020-08-21 NOTE — Progress Notes (Signed)
HPI: Kelsey Rodriguez is a 29 y.o. female who presents to the [facility] for HIV follow-up.  Patient Active Problem List   Diagnosis Date Noted  . S/P craniotomy 05/16/2020  . Brain tumor ([place]) 05/16/2020  . 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 Hypertension   . Pain of upper abdomen   . Suicide ideation   . 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[place])   . Seizure ([place])   . Intracranial mass   . Intractable headache 02/04/2020  . Encephalitis, myelitis, 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[place]) 01/31/2020  . Rotator cuff strain 01/26/2020  . 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 AIDS (acquired immune deficiency syndrome) ([place]) 11/07/2019  . Cerebral edema ([place]) 10/28/2019  . Midline shift of brain   . Pruritus 08/29/2019  . Tendinopathy of left shoulder 01/18/2019  . Chronic pelvic pain in female 01/04/2019  . Lower abdominal pain 06/21/2018  . Syphilis 02/26/2018  . Tuberculosis   . Infertility, female 02/12/2018  . 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 Acute right-sided low back pain with right-sided sciatica 08/24/2017  . Complex regional pain syndrome 02/03/2017  . Headache 10/28/2016  . Avascular necrosis of bone of right hip ([place]) 04/04/2016  . 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 Avascular necrosis of bone of left hip ([place]) 12/14/2015  . 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 Nausea 05/18/2015  . Vertigo 01/23/2015  . Primary adrenal insufficiency ([place]) 01/03/2015  . Chest pain 07/07/2013  . HIV (human immunodeficiency virus infection) ([place]) 03/16/2012  . 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   BICTEGRAVIR-EMTRICITABINE-TENOFOVIR AF (BIKTARVY) 50-200-25 MG TABS TABLET     Take 1 tablet by mouth daily.   CLOTRIMAZOLE (GYNE-LOTRIMIN 3) 2 % VAGINAL CREAM    Place 1 Applicatorful vaginally at bedtime.   LEUCOVORIN (WELLCOVORIN) 15 MG TABLET    Take 1 tablet (15 mg total) by mouth daily.   PYRIMETHAMINE (DARAPRIM) 25 MG TABLET    Take 3 tablets (75 mg total) by mouth daily with breakfast.   SUCRALFATE (CARAFATE) 1 GM/10ML SUSPENSION    Take 10 mLs (1 g total) by mouth 4 (four) times daily -  with meals and at bedtime.   SULFADIAZINE 500 MG TABLET    Take 3 tablets (1,500 mg total) by mouth 4 (four) times daily.   SULFAMETHOXAZOLE-TRIMETHOPRIM (BACTRIM DS) 800-160 MG TABLET    Take 1 tablet by mouth daily.   TERBINAFINE (LAMISIL AT) 1 % CREAM    Apply 1 application topically 2 (two) times daily.  Modified Medications   No medications on file  Discontinued Medications   No medications on file    Allergies: Allergies  Allergen Reactions  . Hydrocodone Itching and Nausea Only    Tolerates Oxycodone  . Tramadol Itching and Nausea Only    Tolerates oxycodone    Past Medical History: Past Medical History:  Diagnosis Date  . 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 Adrenal insufficiency ([place])   . Anemia of chronic disease 03/11/2012  . Back pain of lumbar region with sciatica 02/12/2015  . Bell's palsy 08/26/2013  . Brain lesion   . 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 Chronic back pain   . Chronic leg pain  bilateral knees, ankles  . Depression   . Fatigue   . GERD (gastroesophageal reflux disease)   . Headache   . 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 HIV (human immunodeficiency virus infection) ([place]) 02/2012  . Laceration of ankle, right 11/18/2012  . Lumbar radiculopathy   . Meningitis 02/18/2018  . Pelvic pain   . PID (acute pelvic inflammatory disease) 02/26/2018  . Pneumonia   . Reflux esophagitis 03/11/2012  . Seizure ([place])   . Tuberculosis   . 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 Vertigo   . Wears glasses     Social History: Social  History   Socioeconomic History  . Marital status: Married    Spouse name: Not on file  . Number of children: 0  . Years of education: college  . Highest education level: High school graduate  Occupational History  . Occupation: CNA  Tobacco Use  . Smoking status: Never Smoker  . Smokeless tobacco: Never Used  Vaping Use  . Vaping Use: Never used  Substance and Sexual Activity  . Alcohol use: Not Currently    Alcohol/week: 0.0 standard drinks    Comment: socially  . Drug use: No  . Sexual activity: Yes    Partners: Male    Birth control/protection: None    Comment: pt. given condoms 11/2019  Other Topics Concern  . 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     Drinks about 1 soda a day    Social Determinants of Health   Financial Resource Strain:   . Difficulty of Paying Living Expenses: Not on file  Food Insecurity:   . Worried About Charity fundraiser in the Last Year: Not on file  . Ran Out of Food in the Last Year: Not on file  Transportation Needs:   . Lack of Transportation (Medical): Not on file  . Lack of Transportation (Non-Medical): Not on file  Physical Activity:   . Days of Exercise per Week: Not on file  . Minutes of Exercise per Session: Not on file  Stress:   . Feeling of Stress : Not on file  Social Connections:   . Frequency of Communication with Friends and Family: Not on file  . Frequency of Social Gatherings with Friends and Family: Not on file  . Attends Religious Services: Not on file  . Active Member of Clubs or Organizations: Not on file  . Attends Archivist Meetings: Not on file  . Marital Status: Not on file    Labs: Lab Results  Component Value Date   HIV1RNAQUANT 66,400 (H) 08/13/2020   HIV1RNAQUANT 201,000 05/10/2020   HIV1RNAQUANT 693 (H) 03/07/2020   CD4TABS 54 (L) 08/13/2020   CD4TABS 107 (L) 05/10/2020   CD4TABS 45 (L) 03/07/2020    RPR and STI Lab Results  Component  Value Date   LABRPR Reactive (A) 10/29/2019   LABRPR REACTIVE (A) 04/28/2019   LABRPR Reactive (A) 12/31/2018   LABRPR Reactive (A) 02/18/2018   LABRPR REACTIVE (A) 11/17/2017   RPRTITER 1:2 (H) 04/28/2019   RPRTITER 1:4 (H) 11/17/2017   RPRTITER 1:16 (H) 09/01/2017   RPRTITER 1:1 05/23/2016    STI Results GC CT  08/09/2018 Negative Negative  02/18/2018 Negative Negative  02/12/2018 Negative **POSITIVE**(A)  11/20/2017 Negative Negative  05/23/2016 Negative Negative  01/23/2015 NG: Negative CT: Negative    Hepatitis B Lab Results  Component Value Date   HEPBSAB POS (A)  09/13/2015   HEPBSAG NEGATIVE 03/13/2012   HEPBCAB NEGATIVE 03/13/2012   Hepatitis C No results found for: HEPCAB, HCVRNAPCRQN Hepatitis A Lab Results  Component Value Date   HAV POSITIVE (A) 03/13/2012   Lipids: Lab Results  Component Value Date   CHOL 215 (H) 12/25/2016   TRIG 144 12/25/2016   HDL 67 12/25/2016   CHOLHDL 3.2 12/25/2016   VLDL 29 12/25/2016   LDLCALC 119 (H) 12/25/2016    Current HIV Regimen: Biktarvy one tablet once daily  Assessment: Patient presents to clinic today for toxoplasmosis treatment follow-up. Today Cassie filled patient's pillboxes for her fifth week of therapy. Her adherence was relatively improved from prior visits; she missed all of her medications yesterday as well as 4 other doses of sulfadiazine throughout last week but was otherwise adherent. She states she has continued floaters as well as blurry and/or double vision with daily headaches. She will follow up with her ophthalmologist next month. She is aware of the new lesion seen on repeat MRI completed in August. She also states starting sucralfate has helped mitigate her stomach discomfort and heartburn.   Patient's viral load last week remained elevated at 66,400, but she states she takes her [place] everyday. Will plan to check viral load at next visit. Her CD4 count remained low at 54 last week. Counseled patient on  the importance of taking Biktarvy every day as well as taking her toxoplasmosis regimen everyday. Plan to follow up next week to fill last week of pills and then will follow-up with Dr. Baxter Flattery to determine next course of action.   Of note, patient's vaginal itching has resolved with antifungal cream. She is seeing a new PCP who tested her for STDs which all resulted negative.   Plan: Fill pillboxes next week  Follow-up with Dr. Baxter Flattery next week   Alfonse Spruce, PharmD PGY2 ID Pharmacy Resident [phone]  08/21/2020, 3:21 PM

## 2020-08-22 ENCOUNTER — Telehealth: Payer: Self-pay | Admitting: Pharmacy Technician

## 2020-08-22 NOTE — Telephone Encounter (Signed)
[facility] Patient Advocate Encounter  Patient's medications have been couriered to [facility] from [facility] and will be put into pill boxes for patient  08/27/2020.  Kelsey Rodriguez. Kelsey Rodriguez Patient [facility] Phone: [phone] Fax:  [phone]

## 2020-08-24 ENCOUNTER — Telehealth: Payer: Self-pay

## 2020-08-24 NOTE — Telephone Encounter (Signed)
COVID-19 Pre-Screening Questions:08/24/20  Do you currently have a fever (>100 F), chills or unexplained body aches? NO  Are you currently experiencing new cough, shortness of breath, sore throat, runny nose? NO  .  Have you been in contact with someone that is currently pending confirmation of Covid19 testing or has been confirmed to have the [place] virus?  NO  I have advised patient to call back to reschedule or convert to a MyChart visit if any Covid symptoms appear during the weekend.   **If the patient answers NO to ALL questions -  advise the patient to please call the clinic before coming to the office should any symptoms develop.

## 2020-08-27 ENCOUNTER — Ambulatory Visit (INDEPENDENT_AMBULATORY_CARE_PROVIDER_SITE_OTHER): Payer: Medicaid Other | Admitting: Pharmacist

## 2020-08-27 ENCOUNTER — Telehealth: Payer: Self-pay

## 2020-08-27 ENCOUNTER — Other Ambulatory Visit: Payer: Self-pay | Admitting: Pharmacist

## 2020-08-27 ENCOUNTER — Ambulatory Visit (INDEPENDENT_AMBULATORY_CARE_PROVIDER_SITE_OTHER): Payer: Medicaid Other | Admitting: Internal Medicine

## 2020-08-27 ENCOUNTER — Ambulatory Visit: Payer: Medicaid Other | Admitting: Pharmacist

## 2020-08-27 ENCOUNTER — Encounter: Payer: Self-pay | Admitting: Internal Medicine

## 2020-08-27 ENCOUNTER — Other Ambulatory Visit: Payer: Self-pay

## 2020-08-27 VITALS — BP 116/85 | HR 89 | Temp 98.6°F | Ht 65.0 in | Wt 185.0 lb

## 2020-08-27 DIAGNOSIS — B582 Toxoplasma meningoencephalitis: Secondary | ICD-10-CM | POA: Diagnosis not present

## 2020-08-27 DIAGNOSIS — Z91148 Patient's other noncompliance with medication regimen for other reason: Secondary | ICD-10-CM

## 2020-08-27 DIAGNOSIS — Z23 Encounter for immunization: Secondary | ICD-10-CM | POA: Diagnosis not present

## 2020-08-27 DIAGNOSIS — G43C0 Periodic headache syndromes in child or adult, not intractable: Secondary | ICD-10-CM

## 2020-08-27 DIAGNOSIS — B20 Human immunodeficiency virus [HIV] disease: Secondary | ICD-10-CM | POA: Diagnosis not present

## 2020-08-27 DIAGNOSIS — Z9114 Patient's other noncompliance with medication regimen: Secondary | ICD-10-CM | POA: Diagnosis not present

## 2020-08-27 MED ORDER — POLYETHYLENE GLYCOL 3350 17 G PO PACK: 17.0000 g | PACK | Freq: Every day | ORAL | 0 refills | Status: DC

## 2020-08-27 MED ORDER — BISACODYL 5 MG PO TBEC: 5.0000 mg | DELAYED_RELEASE_TABLET | Freq: Every day | ORAL | 1 refills | Status: DC | PRN

## 2020-08-27 MED ORDER — ATOVAQUONE 750 MG/5ML PO SUSP: 1500.0000 mg | Freq: Two times a day (BID) | ORAL | 11 refills | Status: DC

## 2020-08-27 MED FILL — ATOVAQUONE 750 MG/5ML SUSP: 750 | 30 days supply | Qty: 600 | Fill #0

## 2020-08-27 NOTE — Telephone Encounter (Signed)
Left voicemail asking patient to call office back regarding upcoming appointment. Patient left prior to speaking with Joycelyn Schmid about head CT.   Beryle Flock, RN

## 2020-08-27 NOTE — Progress Notes (Signed)
Patient ID: Kelsey Rodriguez, female   DOB: 08/29/1991, 29 y.o.   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 She notices that  Mostly at night, headache as well as mornings- occipital region Denies blurry vision but has occasional floaters? Complains of some loss of peripheral vision? - mostly on left side  Outpatient Encounter Medications as of 08/27/2020  Medication Sig  . amitriptyline (ELAVIL) 10 MG tablet Take 3 tablets (30 mg total) by mouth at bedtime.  . bictegravir-emtricitabine-tenofovir AF (BIKTARVY) 50-200-25 MG TABS tablet Take 1 tablet by mouth daily.  . clotrimazole-betamethasone (LOTRISONE) cream Apply topically 2 (two) times daily as needed.  [name] leucovorin (WELLCOVORIN) 15 MG tablet Take 1 tablet (15 mg total) by mouth daily.  [name] pyrimethamine (DARAPRIM) 25 MG tablet Take 3 tablets (75 mg total) by mouth daily with breakfast.  . sucralfate (CARAFATE) 1 GM/10ML suspension Take 10 mLs (1 g total) by mouth 4 (four) times daily -  with meals and at bedtime.  . sulfaDIAZINE 500 MG tablet Take 3 tablets (1,500 mg total) by mouth 4 (four) times daily.  [name] sulfamethoxazole-trimethoprim (BACTRIM DS) 800-160 MG tablet Take 1 tablet by mouth daily.  [name] terbinafine (LAMISIL AT) 1 % cream Apply 1 application topically 2 (two) times daily.  . clotrimazole (GYNE-LOTRIMIN 3) 2 % vaginal cream Place 1 Applicatorful vaginally at bedtime. (Patient not taking: Reported on 08/27/2020)  . [DISCONTINUED] SUMAtriptan (IMITREX) 50 MG tablet Take 1 tablet (50 mg total) by mouth every 2 (two) hours as needed for migraine (Maximum dose: 100 mg per dose; 200 mg per 24 hours). May repeat in 2 hours if headache persists or recurs.   No facility-administered encounter medications on file as of 08/27/2020.     Patient Active Problem List   Diagnosis Date Noted  . S/P craniotomy  05/16/2020  . Brain tumor ([place]) 05/16/2020  . 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 Hypertension   . Pain of upper abdomen   . Suicide ideation   . 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[place])   . Seizure ([place])   . Intracranial mass   . Intractable headache 02/04/2020  . Encephalitis, myelitis, 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[place]) 01/31/2020  . Rotator cuff strain 01/26/2020  . 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 AIDS (acquired immune deficiency syndrome) ([place]) 11/07/2019  . Cerebral edema ([place]) 10/28/2019  . Midline shift of brain   . Pruritus 08/29/2019  . Tendinopathy of left shoulder 01/18/2019  . Chronic pelvic pain in female 01/04/2019  . Lower abdominal pain 06/21/2018  . Syphilis 02/26/2018  . Tuberculosis   . Infertility, female 02/12/2018  . 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 Acute right-sided low back pain with right-sided sciatica 08/24/2017  . Complex regional pain syndrome 02/03/2017  . Headache 10/28/2016  . Avascular necrosis of bone of right hip ([place]) 04/04/2016  . 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 Avascular necrosis of bone of left hip ([place]) 12/14/2015  . 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 Nausea 05/18/2015  . Vertigo 01/23/2015  . Primary adrenal insufficiency ([place]) 01/03/2015  . Chest pain 07/07/2013  . HIV (human immunodeficiency virus infection) ([place]) 03/16/2012  . 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 INFLUENZA VACCINE  07/15/2020     Review of Systems Feels bloated for constipation in addition to headache. No blurry vision, seizures. 12 point ros is negative Physical Exam   BP 116/85   Pulse 89   Temp 98.6 F (37 C) (Oral)   Ht 5\' 5"  (1.651 m)   Wt 185 lb (83.9 kg)   SpO2 99%   BMI 30.79 kg/m   Physical Exam  Constitutional:  oriented to person, place, and time. appears  well-developed and well-nourished. No distress.  HENT: [place]/AT, PERRLA, no scleral icterus possible decrease is left peripheral vision Mouth/Throat: Oropharynx is clear and moist. No oropharyngeal exudate.  Cardiovascular: Normal rate, regular rhythm and normal heart sounds. Exam reveals no gallop and no friction rub.  No murmur heard.  Pulmonary/Chest: Effort normal and breath sounds normal. No respiratory distress.  has no wheezes.  Neck = supple, no nuchal rigidity Abdominal: Soft. Bowel sounds are normal.  exhibits no distension. There is no tenderness.  Lymphadenopathy: no cervical adenopathy. No axillary adenopathy Neurological: alert and oriented to person, place, and time.  Skin: Skin is warm and dry. No rash noted. No erythema.  Psychiatric: a normal mood and affect.  behavior is normal.   Lab Results  Component Value Date   CD4TCELL 5 (L) 08/13/2020   Lab Results  Component Value Date   CD4TABS 54 (L) 08/13/2020   CD4TABS 107 (L) 05/10/2020   CD4TABS 45 (L) 03/07/2020   Lab Results  Component Value Date   HIV1RNAQUANT 66,400 (H) 08/13/2020   Lab Results  Component Value Date   HEPBSAB POS (A) 09/13/2015   Lab Results  Component Value Date   LABRPR Reactive (A) 10/29/2019    CBC Lab Results  Component Value Date   WBC 4.3 08/13/2020   RBC 3.78 (L) 08/13/2020   HGB 11.6 (L) 08/13/2020   HCT 34.7 (L) 08/13/2020   PLT 257 08/13/2020   MCV 91.8 08/13/2020   MCH 30.7 08/13/2020   MCHC 33.4 08/13/2020   RDW 15.7 (H) 08/13/2020   LYMPHSABS 1,226 08/13/2020   MONOABS 0.3 07/11/2020   EOSABS 224 08/13/2020    BMET Lab Results  Component Value Date   NA 139 08/13/2020   K 4.2 08/13/2020   CL 107 08/13/2020   CO2 23 08/13/2020   GLUCOSE 83 08/13/2020   BUN 9 08/13/2020   CREATININE 0.98 08/13/2020   CALCIUM 9.7 08/13/2020   GFRNONAA 77 08/13/2020   GFRAA 89 08/13/2020      Assessment and Plan CNS toxo = will check NCHCT given change in symptoms.  Appears she is willing to finish out the last week of induction however, she is unlikely to handle so many pills due to her stated pill aversion - non compliance ==  Will try atovaquone liquid - until CD 4 coutn 200> for 6 months; will switch to atovaquone next week. Impressed upon her that she will need to take meds until her cd 4 count is better, greater than 200 for addn 6 months.  hiv disease = biktarvy continue  oi proph = on atovaquone  Constipation = will give miralax BID or TID add dulcolax 5mg  daily; glycerin enema  covid  Vaccine  3rd dose next week  Health maintenance = will give flu shot today   Spent 35 min with patient on adherence counseling

## 2020-08-28 ENCOUNTER — Ambulatory Visit: Payer: Medicaid Other

## 2020-08-28 ENCOUNTER — Ambulatory Visit: Payer: Medicaid Other | Admitting: Pharmacist

## 2020-08-28 NOTE — Progress Notes (Signed)
HPI: Kelsey Rodriguez is a 29 y.o. female who presents to the [facility] clinic for HIV follow-up.  Patient Active Problem List   Diagnosis Date Noted  . S/P craniotomy 05/16/2020  . Brain tumor ([place]) 05/16/2020  . 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 Hypertension   . Pain of upper abdomen   . Suicide ideation   . 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 Seizure ([place])   . Intracranial mass   . Intractable headache 02/04/2020  . Encephalitis, myelitis, 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[place]) 01/31/2020  . Rotator cuff strain 01/26/2020  . 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 AIDS (acquired immune deficiency syndrome) ([place]) 11/07/2019  . Cerebral edema ([place]) 10/28/2019  . Midline shift of brain   . Pruritus 08/29/2019  . Tendinopathy of left shoulder 01/18/2019  . Chronic pelvic pain in female 01/04/2019  . Lower abdominal pain 06/21/2018  . Syphilis 02/26/2018  . Tuberculosis   . Infertility, female 02/12/2018  . 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 Acute right-sided low back pain with right-sided sciatica 08/24/2017  . Complex regional pain syndrome 02/03/2017  . Headache 10/28/2016  . Avascular necrosis of bone of right hip ([place]) 04/04/2016  . 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 Avascular necrosis of bone of left hip ([place]) 12/14/2015  . 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 Nausea 05/18/2015  . Vertigo 01/23/2015  . Primary adrenal insufficiency ([place]) 01/03/2015  . Chest pain 07/07/2013  . HIV (human immunodeficiency virus infection) ([place]) 03/16/2012  . 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   BISACODYL (DULCOLAX) 5 MG EC TABLET    Take 1 tablet (5 mg total) by mouth daily as needed for  moderate constipation.   POLYETHYLENE GLYCOL (MIRALAX MIX-IN PAX) 17 G PACKET    Take 17 g by mouth daily.  Previous Medications   AMITRIPTYLINE (ELAVIL) 10 MG TABLET    Take 3 tablets (30 mg total) by mouth at bedtime.   BICTEGRAVIR-EMTRICITABINE-TENOFOVIR AF (BIKTARVY) 50-200-25 MG TABS TABLET    Take 1 tablet by mouth daily.   CLOTRIMAZOLE (GYNE-LOTRIMIN 3) 2 % VAGINAL CREAM    Place 1 Applicatorful vaginally at bedtime.   CLOTRIMAZOLE-BETAMETHASONE (LOTRISONE) CREAM    Apply topically 2 (two) times daily as needed.   LEUCOVORIN (WELLCOVORIN) 15 MG TABLET    Take 1 tablet (15 mg total) by mouth daily.   PYRIMETHAMINE (DARAPRIM) 25 MG TABLET    Take 3 tablets (75 mg total) by mouth daily with breakfast.   SUCRALFATE (CARAFATE) 1 GM/10ML SUSPENSION    Take 10 mLs (1 g total) by mouth 4 (four) times daily -  with meals and at bedtime.   TERBINAFINE (LAMISIL AT) 1 % CREAM    Apply 1 application topically 2 (two) times daily.  Modified Medications   No medications on file  Discontinued Medications   SULFADIAZINE 500 MG TABLET    Take 3 tablets (1,500 mg total) by mouth 4 (four) times daily.   SULFAMETHOXAZOLE-TRIMETHOPRIM (BACTRIM DS) 800-160 MG TABLET    Take 1 tablet by mouth daily.    Allergies: Allergies  Allergen Reactions  . Hydrocodone Itching and Nausea Only    Tolerates Oxycodone  . Tramadol Itching and Nausea  Only    Tolerates oxycodone    Past Medical History: Past Medical History:  Diagnosis Date  . 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 Adrenal insufficiency ([place])   . Anemia of chronic disease 03/11/2012  . Back pain of lumbar region with sciatica 02/12/2015  . Bell's palsy 08/26/2013  . Brain lesion   . 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 Chronic back pain   . Chronic leg pain    bilateral knees, ankles  . Depression   . Fatigue   . GERD (gastroesophageal reflux disease)   . Headache   . 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 HIV (human immunodeficiency virus infection)  ([place]) 02/2012  . Laceration of ankle, right 11/18/2012  . Lumbar radiculopathy   . Meningitis 02/18/2018  . Pelvic pain   . PID (acute pelvic inflammatory disease) 02/26/2018  . Pneumonia   . Reflux esophagitis 03/11/2012  . Seizure ([place])   . Tuberculosis   . 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 Vertigo   . Wears glasses     Social History: Social History   Socioeconomic History  . Marital status: Married    Spouse name: Not on file  . Number of children: 0  . Years of education: college  . Highest education level: High school graduate  Occupational History  . Occupation: CNA  Tobacco Use  . Smoking status: Never Smoker  . Smokeless tobacco: Never Used  Vaping Use  . Vaping Use: Never used  Substance and Sexual Activity  . Alcohol use: Not Currently    Alcohol/week: 0.0 standard drinks    Comment: socially  . Drug use: No  . Sexual activity: Yes    Partners: Male    Birth control/protection: None    Comment: pt. given condoms 08/27/2020  Other Topics Concern  . 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     Drinks about 1 soda a day    Social Determinants of Health   Financial Resource Strain:   . Difficulty of Paying Living Expenses: Not on file  Food Insecurity:   . Worried About Charity fundraiser in the Last Year: Not on file  . Ran Out of Food in the Last Year: Not on file  Transportation Needs:   . Lack of Transportation (Medical): Not on file  . Lack of Transportation (Non-Medical): Not on file  Physical Activity:   . Days of Exercise per Week: Not on file  . Minutes of Exercise per Session: Not on file  Stress:   . Feeling of Stress : Not on file  Social Connections:   . Frequency of Communication with Friends and Family: Not on file  . Frequency of Social Gatherings with Friends and Family: Not on file  . Attends Religious Services: Not on file  . Active Member of Clubs or Organizations: Not on file   . Attends Archivist Meetings: Not on file  . Marital Status: Not on file    Labs: Lab Results  Component Value Date   HIV1RNAQUANT 66,400 (H) 08/13/2020   HIV1RNAQUANT 201,000 05/10/2020   HIV1RNAQUANT 693 (H) 03/07/2020   CD4TABS 54 (L) 08/13/2020   CD4TABS 107 (L) 05/10/2020   CD4TABS 45 (L) 03/07/2020    RPR and STI Lab Results  Component Value Date   LABRPR Reactive (A) 10/29/2019   LABRPR REACTIVE (A) 04/28/2019   LABRPR Reactive (A) 12/31/2018   LABRPR Reactive (A) 02/18/2018   LABRPR  REACTIVE (A) 11/17/2017   RPRTITER 1:2 (H) 04/28/2019   RPRTITER 1:4 (H) 11/17/2017   RPRTITER 1:16 (H) 09/01/2017   RPRTITER 1:1 05/23/2016    STI Results GC CT  08/09/2018 Negative Negative  02/18/2018 Negative Negative  02/12/2018 Negative **POSITIVE**(A)  11/20/2017 Negative Negative  05/23/2016 Negative Negative  01/23/2015 NG: Negative CT: Negative    Hepatitis B Lab Results  Component Value Date   HEPBSAB POS (A) 09/13/2015   HEPBSAG NEGATIVE 03/13/2012   HEPBCAB NEGATIVE 03/13/2012   Hepatitis C No results found for: HEPCAB, HCVRNAPCRQN Hepatitis A Lab Results  Component Value Date   HAV POSITIVE (A) 03/13/2012   Lipids: Lab Results  Component Value Date   CHOL 215 (H) 12/25/2016   TRIG 144 12/25/2016   HDL 67 12/25/2016   CHOLHDL 3.2 12/25/2016   VLDL 29 12/25/2016   LDLCALC 119 (H) 12/25/2016    Current HIV Regimen: Biktarvy  Assessment: Kinleigh is here today for her final medication pillbox fill for the 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 She missed several doses in the past week but overall took most of her medications. She is very anxious to finish the induction period.   Usually, for secondary prophylaxis/chronic suppression, we would continue pyrimethamine, leucovorin, and sulfadiazine at lower doses, but since she had less than ideal adherence to her 6 week induction regimen, we will go with something not as well  studied but still ok to use - atovaquone liquid. She is happy to be taking a liquid. I emphasized that she is technically supposed to take this until her CD4 count is >200 for 6 months. I encouraged her to take her Biktarvy every day to allow that to happen.  She will follow up with me next week to start the atovaquone.  Plan: - Finish up 6 week induction phase - F/u with me again next week  Ronelle Michie L. Merary Garguilo, PharmD, BCIDP, AAHIVP, CPP Clinical Pharmacist Practitioner Infectious Diseases [facility] 08/28/2020, 11:03 AM

## 2020-08-29 ENCOUNTER — Telehealth: Payer: Self-pay | Admitting: Pharmacy Technician

## 2020-08-29 NOTE — Telephone Encounter (Signed)
[facility] Patient Advocate Encounter  Patient's medications have been couriered to [facility] from [facility] and will be picked up 09/03/2020 from clinic pharmacist.  Kelsey Rodriguez. Nadara Mustard [place] Patient [facility] Phone: [phone] Fax:  [phone]

## 2020-08-30 ENCOUNTER — Other Ambulatory Visit: Payer: Self-pay

## 2020-08-30 ENCOUNTER — Encounter (HOSPITAL_COMMUNITY): Payer: Self-pay | Admitting: Emergency Medicine

## 2020-08-30 ENCOUNTER — Ambulatory Visit (HOSPITAL_COMMUNITY)
Admission: EM | Admit: 2020-08-30 | Discharge: 2020-08-30 | Disposition: A | Discharge status: Home / Self Care | Payer: Medicaid Other | Attending: Physician Assistant | Admitting: Physician Assistant

## 2020-08-30 DIAGNOSIS — E271 Primary adrenocortical insufficiency: Secondary | ICD-10-CM | POA: Diagnosis not present

## 2020-08-30 DIAGNOSIS — R87810 Cervical high risk human papillomavirus (HPV) DNA test positive: Secondary | ICD-10-CM | POA: Diagnosis not present

## 2020-08-30 DIAGNOSIS — Z79899 Other long term (current) drug therapy: Secondary | ICD-10-CM | POA: Insufficient documentation

## 2020-08-30 DIAGNOSIS — R519 Headache, unspecified: Secondary | ICD-10-CM | POA: Insufficient documentation

## 2020-08-30 DIAGNOSIS — I10 Essential (primary) hypertension: Secondary | ICD-10-CM | POA: Diagnosis not present

## 2020-08-30 DIAGNOSIS — F329 Major depressive disorder, single episode, unspecified: Secondary | ICD-10-CM | POA: Insufficient documentation

## 2020-08-30 DIAGNOSIS — Z21 Asymptomatic human immunodeficiency virus [HIV] infection status: Secondary | ICD-10-CM | POA: Insufficient documentation

## 2020-08-30 DIAGNOSIS — M791 Myalgia, unspecified site: Secondary | ICD-10-CM | POA: Insufficient documentation

## 2020-08-30 DIAGNOSIS — G8929 Other chronic pain: Secondary | ICD-10-CM | POA: Diagnosis not present

## 2020-08-30 DIAGNOSIS — M545 Low back pain: Secondary | ICD-10-CM | POA: Insufficient documentation

## 2020-08-30 DIAGNOSIS — Z20822 Contact with and (suspected) exposure to covid-19: Secondary | ICD-10-CM | POA: Insufficient documentation

## 2020-08-30 LAB — CBC WITH DIFFERENTIAL/PLATELET
Abs Immature Granulocytes: 0.03 10*3/uL (ref 0.00–0.07)
Basophils Absolute: 0 10*3/uL (ref 0.0–0.1)
Basophils Relative: 1 %
Eosinophils Absolute: 0.5 10*3/uL (ref 0.0–0.5)
Eosinophils Relative: 8 %
HCT: 35.4 % — ABNORMAL LOW (ref 36.0–46.0)
Hemoglobin: 11.3 g/dL — ABNORMAL LOW (ref 12.0–15.0)
Immature Granulocytes: 1 %
Lymphocytes Relative: 36 %
Lymphs Abs: 2.1 10*3/uL (ref 0.7–4.0)
MCH: 29.9 pg (ref 26.0–34.0)
MCHC: 31.9 g/dL (ref 30.0–36.0)
MCV: 93.7 fL (ref 80.0–100.0)
Monocytes Absolute: 0.6 10*3/uL (ref 0.1–1.0)
Monocytes Relative: 10 %
Neutro Abs: 2.6 10*3/uL (ref 1.7–7.7)
Neutrophils Relative %: 44 %
Platelets: 249 10*3/uL (ref 150–400)
RBC: 3.78 MIL/uL — ABNORMAL LOW (ref 3.87–5.11)
RDW: 15.3 % (ref 11.5–15.5)
WBC: 5.8 10*3/uL (ref 4.0–10.5)
nRBC: 0 % (ref 0.0–0.2)

## 2020-08-30 LAB — COMPREHENSIVE METABOLIC PANEL
ALT: 22 U/L (ref 0–44)
AST: 21 U/L (ref 15–41)
Albumin: 4.1 g/dL (ref 3.5–5.0)
Alkaline Phosphatase: 92 U/L (ref 38–126)
Anion gap: 9 (ref 5–15)
BUN: 12 mg/dL (ref 6–20)
CO2: 23 mmol/L (ref 22–32)
Calcium: 9.5 mg/dL (ref 8.9–10.3)
Chloride: 104 mmol/L (ref 98–111)
Creatinine, Ser: 0.89 mg/dL (ref 0.44–1.00)
GFR calc Af Amer: >60 mL/min (ref >60)
GFR calc non Af Amer: >60 mL/min (ref >60)
Glucose, Bld: 87 mg/dL (ref 70–99)
Potassium: 4.1 mmol/L (ref 3.5–5.1)
Sodium: 136 mmol/L (ref 135–145)
Total Bilirubin: 0.6 mg/dL (ref 0.3–1.2)
Total Protein: 8 g/dL (ref 6.5–8.1)

## 2020-08-30 MED ORDER — OXYCODONE-ACETAMINOPHEN 5-325 MG PO TABS: 1.0000 | ORAL_TABLET | ORAL | 0 refills | Status: DC | PRN

## 2020-08-30 NOTE — Discharge Instructions (Addendum)
You have labs pending.  Go to the Emergency department if symptoms worsen or change

## 2020-08-30 NOTE — ED Triage Notes (Signed)
Pt presents with back, neck and head pain xs 3 days. States has hx of headaches. Pain is worst when walking.

## 2020-08-30 NOTE — ED Provider Notes (Signed)
[facility]    CSN: [id number] Arrival date & time: 08/30/20  1656      History   Chief Complaint Chief Complaint  Patient presents with  . Back Pain    HPI Kelsey Rodriguez is a 29 y.o. female.   Pt reports she has HIv and toxoplasmosis.  Pt complains of a headache.  Pt has chronic headache   The history is provided by the patient. No language interpreter was used.  Pt has been immunized for covid.  She is scheduled to follow up with Dr. Baxter Flattery and to have a repeat MRi next week.  Pt denies any weakness,  No change in vision or hearing.   Past Medical History:  Diagnosis Date  . 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 Adrenal insufficiency ([place])   . Anemia of chronic disease 03/11/2012  . Back pain of lumbar region with sciatica 02/12/2015  . Bell's palsy 08/26/2013  . Brain lesion   . 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 Chronic back pain   . Chronic leg pain    bilateral knees, ankles  . Depression   . Fatigue   . GERD (gastroesophageal reflux disease)   . Headache   . 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 HIV (human immunodeficiency virus infection) ([place]) 02/2012  . Laceration of ankle, right 11/18/2012  . Lumbar radiculopathy   . Meningitis 02/18/2018  . Pelvic pain   . PID (acute pelvic inflammatory disease) 02/26/2018  . Pneumonia   . Reflux esophagitis 03/11/2012  . Seizure ([place])   . Tuberculosis   . 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 Vertigo   . Wears glasses     Patient Active Problem List   Diagnosis Date Noted  . S/P craniotomy 05/16/2020  . Brain tumor ([place]) 05/16/2020  . 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 Hypertension   . Pain of upper abdomen   . Suicide ideation   . 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[place])   . Seizure ([place])   . Intracranial mass   . Intractable headache 02/04/2020  . Encephalitis, myelitis, 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[place]) 01/31/2020  . Rotator cuff  strain 01/26/2020  . 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 AIDS (acquired immune deficiency syndrome) ([place]) 11/07/2019  . Cerebral edema ([place]) 10/28/2019  . Midline shift of brain   . Pruritus 08/29/2019  . Tendinopathy of left shoulder 01/18/2019  . Chronic pelvic pain in female 01/04/2019  . Lower abdominal pain 06/21/2018  . Syphilis 02/26/2018  . Tuberculosis   . Infertility, female 02/12/2018  . 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 Acute right-sided low back pain with right-sided sciatica 08/24/2017  . Complex regional pain syndrome 02/03/2017  . Headache 10/28/2016  . Avascular necrosis of bone of right hip ([place]) 04/04/2016  . 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 Avascular necrosis of bone of left hip ([place]) 12/14/2015  . 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 Nausea 05/18/2015  . Vertigo 01/23/2015  . Primary adrenal insufficiency ([place]) 01/03/2015  . Chest pain 07/07/2013  . HIV (human immunodeficiency virus infection) ([place]) 03/16/2012  . 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 APPENDECTOMY  ~ 2000  . APPLICATION OF CRANIAL NAVIGATION N/A 05/16/2020   Procedure: APPLICATION OF CRANIAL NAVIGATION;  Surgeon: Newman Pies, MD;  Location: [facility];  Service: Neurosurgery;  Laterality: N/A;  . CRANIOTOMY Right  05/16/2020   Procedure: Craniotomy for Resection of Lesion;  Surgeon: Newman Pies, MD;  Location: [facility];  Service: Neurosurgery;  Laterality: Right;  right  . DILATION AND CURETTAGE OF UTERUS  2008  . ESOPHAGOGASTRODUODENOSCOPY  03/11/2012   Procedure: ESOPHAGOGASTRODUODENOSCOPY (EGD);  Surgeon: Lafayette Dragon, MD;  Location[facility] ENDOSCOPY;  Service: Endoscopy;  Laterality: N/A;  . ESOPHAGOGASTRODUODENOSCOPY N/A 03/07/2014   Procedure: ESOPHAGOGASTRODUODENOSCOPY (EGD);  Surgeon: Gatha Mayer, MD;  Location[facility] ENDOSCOPY;  Service: Endoscopy;  Laterality: N/A;   . LUNG BIOPSY  02/2012  . TOTAL HIP ARTHROPLASTY Left 12/14/2015   Procedure: LEFT TOTAL HIP ARTHROPLASTY ANTERIOR APPROACH;  Surgeon: Mcarthur Rossetti, MD;  Location: [facility];  Service: Orthopedics;  Laterality: Left;  . TOTAL HIP ARTHROPLASTY Right 04/04/2016   Procedure: RIGHT TOTAL HIP ARTHROPLASTY ANTERIOR APPROACH;  Surgeon: Mcarthur Rossetti, MD;  Location: [facility];  Service: Orthopedics;  Laterality: Right;    OB History    Gravida  1   Para  0   Term  0   Preterm  0   AB  1   Living  0     SAB  1   TAB  0   Ectopic  0   Multiple  0   Live Births               Home Medications    Prior to Admission medications   Medication Sig Start Date End Date Taking? Authorizing Provider  amitriptyline (ELAVIL) 10 MG tablet Take 3 tablets (30 mg total) by mouth at bedtime. 06/28/20   Ward Givens, NP  atovaquone (MEPRON) 750 MG/5ML suspension Take 10 mLs (1,500 mg total) by mouth 2 (two) times daily with a meal. 08/27/20   Kuppelweiser, Cassie L, RPH-CPP  bictegravir-emtricitabine-tenofovir AF (BIKTARVY) 50-200-25 MG TABS tablet Take 1 tablet by mouth daily. 07/25/20   Kuppelweiser, Cassie L, RPH-CPP  bisacodyl (DULCOLAX) 5 MG EC tablet Take 1 tablet (5 mg total) by mouth daily as needed for moderate constipation. 08/27/20   Carlyle Basques, MD  clotrimazole (GYNE-LOTRIMIN 3) 2 % vaginal cream Place 1 Applicatorful vaginally at bedtime. Patient not taking: Reported on 08/27/2020 08/13/20   Carlyle Basques, MD  clotrimazole-betamethasone (LOTRISONE) cream Apply topically 2 (two) times daily as needed. 08/17/20   [provider]  leucovorin (WELLCOVORIN) 15 MG tablet Take 1 tablet (15 mg total) by mouth daily. 07/25/20   Kuppelweiser, Cassie L, RPH-CPP  oxyCODONE-acetaminophen (PERCOCET) 5-325 MG tablet Take 1 tablet by mouth every 4 (four) hours as needed for severe pain. 08/30/20 08/30/21  Fransico Meadow, PA-C  polyethylene glycol (MIRALAX MIX-IN PAX) 17 g  packet Take 17 g by mouth daily. 08/27/20   Carlyle Basques, MD  pyrimethamine (DARAPRIM) 25 MG tablet Take 3 tablets (75 mg total) by mouth daily with breakfast. 07/25/20   Kuppelweiser, Cassie L, RPH-CPP  sucralfate (CARAFATE) 1 GM/10ML suspension Take 10 mLs (1 g total) by mouth 4 (four) times daily -  with meals and at bedtime. 08/13/20   Kuppelweiser, Cassie L, RPH-CPP  terbinafine (LAMISIL AT) 1 % cream Apply 1 application topically 2 (two) times daily. 08/13/20   Kuppelweiser, Cassie L, RPH-CPP  SUMAtriptan (IMITREX) 50 MG tablet Take 1 tablet (50 mg total) by mouth every 2 (two) hours as needed for migraine (Maximum dose: 100 mg per dose; 200 mg per 24 hours). May repeat in 2 hours if headache persists or recurs. 10/26/19 01/31/20  Caroline More, DO    Family History Family  History  Problem Relation Age of Onset  . Heart disease Father        Vague not clearly cardiac  . Hypertension Mother     Social History Social History   Tobacco Use  . Smoking status: Never Smoker  . Smokeless tobacco: Never Used  Vaping Use  . Vaping Use: Never used  Substance Use Topics  . Alcohol use: Not Currently    Alcohol/week: 0.0 standard drinks    Comment: socially  . Drug use: No     Allergies   Hydrocodone and Tramadol   Review of Systems Review of Systems  All other systems reviewed and are negative.    Physical Exam Triage Vital Signs ED Triage Vitals  Enc Vitals Group     BP 08/30/20 1828 125/83     Pulse Rate 08/30/20 1828 95     Resp 08/30/20 1828 18     Temp 08/30/20 1828 99.2 F (37.3 C)     Temp Source 08/30/20 1828 Oral     SpO2 08/30/20 1828 100 %     Weight --      Height --      Head Circumference --      Peak Flow --      Pain Score 08/30/20 1826 10     Pain Loc --      Pain Edu? --      Excl. in [place]? --    No data found.  Updated Vital Signs BP 125/83 (BP Location: Right Arm)   Pulse 95   Temp 99.2 F (37.3 C) (Oral)   Resp 18   LMP 08/15/2020    SpO2 100%   Visual Acuity Right Eye Distance:   Left Eye Distance:   Bilateral Distance:    Right Eye Near:   Left Eye Near:    Bilateral Near:     Physical Exam Vitals and nursing note reviewed.  Constitutional:      Appearance: She is well-developed.  HENT:     Head: Normocephalic.     Right Ear: Tympanic membrane normal.     Left Ear: Tympanic membrane normal.     Nose: Nose normal.     Mouth/Throat:     Mouth: Mucous membranes are moist.  Eyes:     Pupils: Pupils are equal, round, and reactive to light.  Cardiovascular:     Rate and Rhythm: Normal rate.     Pulses: Normal pulses.     Heart sounds: Normal heart sounds.  Pulmonary:     Effort: Pulmonary effort is normal.  Abdominal:     General: Abdomen is flat. There is no distension.  Musculoskeletal:        General: Normal range of motion.     Cervical back: Normal range of motion.  Skin:    General: Skin is warm.  Neurological:     General: No focal deficit present.     Mental Status: She is alert and oriented to person, place, and time.  Psychiatric:        Mood and Affect: Mood normal.      UC Treatments / Results  Labs (all labs ordered are listed, but only abnormal results are displayed) Labs Reviewed  CBC WITH DIFFERENTIAL/PLATELET - Abnormal; Notable for the following components:      Result Value   RBC 3.78 (*)    Hemoglobin 11.3 (*)    HCT 35.4 (*)    All other components within normal limits  SARS CORONAVIRUS 2 (  TAT 6-24 HRS)  COMPREHENSIVE METABOLIC PANEL    EKG   Radiology No results found.  Procedures Procedures (including critical care time)  Medications Ordered in UC Medications - No data to display  Initial Impression / Assessment and Plan / UC Course  I have reviewed the triage vital signs and the nursing notes.  Pertinent labs & imaging results that were available during my care of the patient were reviewed by me and considered in my medical decision making (see chart  for details).     MDM:  Labs and covid ordered.  I discussed symptoms with pt.  I will treat her pain.  Pt advised to call Dr. Baxter Flattery tomorrow.  Final Clinical Impressions(s) / UC Diagnoses   Final diagnoses:  Myalgia     Discharge Instructions     You have labs pending.  Go to the Emergency department if symptoms worsen or change    ED Prescriptions    Medication Sig Dispense Auth. Provider   oxyCODONE-acetaminophen (PERCOCET) 5-325 MG tablet Take 1 tablet by mouth every 4 (four) hours as needed for severe pain. 10 tablet Fransico Meadow, [place]     I have reviewed the PDMP during this encounter.  Labs and covid returned.  Covid is negative, Labs no acute change.   An After Visit Summary was printed and given to the patient.    Fransico Meadow, [place] 08/31/20 1414

## 2020-08-31 LAB — SARS CORONAVIRUS 2 (TAT 6-24 HRS): SARS Coronavirus 2: NEGATIVE

## 2020-09-03 ENCOUNTER — Ambulatory Visit: Payer: Medicaid Other | Admitting: Pharmacist

## 2020-09-03 ENCOUNTER — Encounter: Payer: Self-pay | Admitting: Pharmacist

## 2020-09-04 ENCOUNTER — Ambulatory Visit: Payer: Medicaid Other | Admitting: Pharmacist

## 2020-09-04 ENCOUNTER — Ambulatory Visit: Payer: Medicaid Other

## 2020-09-05 DIAGNOSIS — B589 Toxoplasmosis, unspecified: Secondary | ICD-10-CM | POA: Diagnosis not present

## 2020-09-05 DIAGNOSIS — G4489 Other headache syndrome: Secondary | ICD-10-CM | POA: Diagnosis not present

## 2020-09-05 DIAGNOSIS — N9489 Other specified conditions associated with female genital organs and menstrual cycle: Secondary | ICD-10-CM | POA: Diagnosis not present

## 2020-09-05 DIAGNOSIS — B20 Human immunodeficiency virus [HIV] disease: Secondary | ICD-10-CM | POA: Diagnosis not present

## 2020-09-07 ENCOUNTER — Ambulatory Visit
Admission: RE | Admit: 2020-09-07 | Discharge: 2020-09-07 | Disposition: A | Discharge status: Home / Self Care | Payer: Medicaid Other | Source: Ambulatory Visit | Attending: Internal Medicine | Admitting: Internal Medicine

## 2020-09-07 DIAGNOSIS — B582 Toxoplasma meningoencephalitis: Secondary | ICD-10-CM

## 2020-09-10 ENCOUNTER — Telehealth: Payer: Self-pay

## 2020-09-10 MED FILL — IBUPROFEN 800 MG TAB: 800 | 20 days supply | Qty: 60 | Fill #0

## 2020-09-10 NOTE — Telephone Encounter (Signed)
I would give them Dr. Storm Frisk pager number!

## 2020-09-10 NOTE — Telephone Encounter (Signed)
Jane with Radiology called to give a call report for CT of the head on the patient. She reports a concern of a new mass lesion on the left frontal lobe. MRI w/ contrast is recommended. Please advise Oluwatoyin Banales T Brooks Sailors

## 2020-09-10 NOTE — Telephone Encounter (Signed)
Brandi with [facility] advised to page Dr. Baxter Flattery per Rubin Payor. Pager number provided to [facility]. [place] T Brooks Sailors

## 2020-09-11 NOTE — Telephone Encounter (Signed)
Thank you Diminique!

## 2020-09-14 DIAGNOSIS — G936 Cerebral edema: Secondary | ICD-10-CM | POA: Diagnosis not present

## 2020-09-14 DIAGNOSIS — Z419 Encounter for procedure for purposes other than remedying health state, unspecified: Secondary | ICD-10-CM | POA: Diagnosis not present

## 2020-09-14 MED FILL — SULFAMETHOXAZOLE-TMP DS TAB: 800-160 | 30 days supply | Qty: 30 | Fill #2

## 2020-09-14 MED FILL — BIKTARVY 50-200-25 MG TABS: 50-200-25 | 30 days supply | Qty: 30 | Fill #2

## 2020-09-17 ENCOUNTER — Telehealth: Payer: Self-pay | Admitting: Pharmacy Technician

## 2020-09-17 NOTE — Telephone Encounter (Signed)
[facility] Patient Advocate Encounter  Patient's medications have been couriered to [facility] from [facility] and are ready for pick up.  Shayleen Eppinger E. Dorethy Tomey, CPhT Specialty Pharmacy Patient Advocate [facility] Phone: [phone] Fax:  [phone]   

## 2020-09-20 DIAGNOSIS — N83202 Unspecified ovarian cyst, left side: Secondary | ICD-10-CM | POA: Diagnosis not present

## 2020-09-20 DIAGNOSIS — N83201 Unspecified ovarian cyst, right side: Secondary | ICD-10-CM | POA: Diagnosis not present

## 2020-09-27 DIAGNOSIS — H0288A Meibomian gland dysfunction right eye, upper and lower eyelids: Secondary | ICD-10-CM | POA: Diagnosis not present

## 2020-09-27 DIAGNOSIS — H532 Diplopia: Secondary | ICD-10-CM | POA: Diagnosis not present

## 2020-09-27 DIAGNOSIS — H0288B Meibomian gland dysfunction left eye, upper and lower eyelids: Secondary | ICD-10-CM | POA: Diagnosis not present

## 2020-09-27 DIAGNOSIS — H43812 Vitreous degeneration, left eye: Secondary | ICD-10-CM | POA: Diagnosis not present

## 2020-09-27 DIAGNOSIS — H53462 Homonymous bilateral field defects, left side: Secondary | ICD-10-CM | POA: Diagnosis not present

## 2020-09-27 DIAGNOSIS — H538 Other visual disturbances: Secondary | ICD-10-CM | POA: Diagnosis not present

## 2020-09-27 DIAGNOSIS — H04123 Dry eye syndrome of bilateral lacrimal glands: Secondary | ICD-10-CM | POA: Diagnosis not present

## 2020-09-27 NOTE — Telephone Encounter (Signed)
Patient called office today stating she would like MD to write letter for her stating she has been out of work due to medical reasons. States she is applying for assistance to help cover her bills.  Patient has not returned to work yet and is behind paying bills. Did call PCP, but was told that she needed to be seen before she could get a letter. Will forward message to MD to advise on letter. [place]

## 2020-09-28 DIAGNOSIS — G4489 Other headache syndrome: Secondary | ICD-10-CM | POA: Diagnosis not present

## 2020-09-28 DIAGNOSIS — B589 Toxoplasmosis, unspecified: Secondary | ICD-10-CM | POA: Diagnosis not present

## 2020-09-28 DIAGNOSIS — D279 Benign neoplasm of unspecified ovary: Secondary | ICD-10-CM | POA: Diagnosis not present

## 2020-09-28 DIAGNOSIS — B20 Human immunodeficiency virus [HIV] disease: Secondary | ICD-10-CM | POA: Diagnosis not present

## 2020-10-09 DIAGNOSIS — G936 Cerebral edema: Secondary | ICD-10-CM | POA: Diagnosis not present

## 2020-10-10 ENCOUNTER — Other Ambulatory Visit: Payer: Self-pay | Admitting: Internal Medicine

## 2020-10-10 ENCOUNTER — Other Ambulatory Visit: Payer: Self-pay

## 2020-10-10 ENCOUNTER — Ambulatory Visit (INDEPENDENT_AMBULATORY_CARE_PROVIDER_SITE_OTHER): Payer: Medicaid Other | Admitting: Internal Medicine

## 2020-10-10 ENCOUNTER — Encounter: Payer: Self-pay | Admitting: Internal Medicine

## 2020-10-10 VITALS — BP 119/84 | HR 100 | Temp 98.1°F | Ht 63.0 in | Wt 185.0 lb

## 2020-10-10 DIAGNOSIS — B20 Human immunodeficiency virus [HIV] disease: Secondary | ICD-10-CM | POA: Diagnosis not present

## 2020-10-10 DIAGNOSIS — B373 Candidiasis of vulva and vagina: Secondary | ICD-10-CM | POA: Diagnosis not present

## 2020-10-10 DIAGNOSIS — B582 Toxoplasma meningoencephalitis: Secondary | ICD-10-CM

## 2020-10-10 DIAGNOSIS — N83209 Unspecified ovarian cyst, unspecified side: Secondary | ICD-10-CM | POA: Diagnosis not present

## 2020-10-10 DIAGNOSIS — B3731 Acute candidiasis of vulva and vagina: Secondary | ICD-10-CM

## 2020-10-10 MED ORDER — FLUCONAZOLE 150 MG PO TABS: 150.0000 mg | ORAL_TABLET | Freq: Every day | ORAL | 0 refills | Status: DC

## 2020-10-10 MED FILL — FLUCONAZOLE 150 MG TABS: 150 | 5 days supply | Qty: 5 | Fill #0

## 2020-10-10 MED FILL — SULFAMETHOXAZOLE-TMP DS TAB: 800-160 | 30 days supply | Qty: 30 | Fill #3

## 2020-10-10 MED FILL — BIKTARVY 50-200-25 MG TABS: 50-200-25 | 30 days supply | Qty: 30 | Fill #3

## 2020-10-10 MED FILL — ATOVAQUONE 750 MG/5ML SUSP: 750 | 30 days supply | Qty: 600 | Fill #1

## 2020-10-10 NOTE — Progress Notes (Signed)
RFV: follow up for hiv disease and CNS toxo  Patient ID: Kelsey Rodriguez, female   DOB: [birth year], 29 y.o.   MRN: [id number]  HPI 29yo F with HIV disease and CNS toxo, she reports tolerating atovaquone and taking it regularly, she is noticing Less frequent headaches  Taking atovaquone, getting refill today. Continues BID  Saw dr Arnoldo Morale 3 weeks ago. But had mri with contrast yesterday.  Eye sight improved. But notices floaters with severe headache   Outpatient Encounter Medications as of 10/10/2020  Medication Sig  . amitriptyline (ELAVIL) 10 MG tablet Take 3 tablets (30 mg total) by mouth at bedtime.  [name] atovaquone (MEPRON) 750 MG/5ML suspension Take 10 mLs (1,500 mg total) by mouth 2 (two) times daily with a meal.  . bictegravir-emtricitabine-tenofovir AF (BIKTARVY) 50-200-25 MG TABS tablet Take 1 tablet by mouth daily.  . bisacodyl (DULCOLAX) 5 MG EC tablet Take 1 tablet (5 mg total) by mouth daily as needed for moderate constipation.  [name] ibuprofen (ADVIL) 800 MG tablet Take 800 mg by mouth 3 (three) times daily as needed.  . polyethylene glycol (MIRALAX MIX-IN PAX) 17 g packet Take 17 g by mouth daily.  . sucralfate (CARAFATE) 1 GM/10ML suspension Take 10 mLs (1 g total) by mouth 4 (four) times daily -  with meals and at bedtime.  . clotrimazole (GYNE-LOTRIMIN 3) 2 % vaginal cream Place 1 Applicatorful vaginally at bedtime. (Patient not taking: Reported on 08/27/2020)  . clotrimazole-betamethasone (LOTRISONE) cream Apply topically 2 (two) times daily as needed. (Patient not taking: Reported on 10/10/2020)  . leucovorin (WELLCOVORIN) 15 MG tablet Take 1 tablet (15 mg total) by mouth daily.  [name] oxyCODONE-acetaminophen (PERCOCET) 5-325 MG tablet Take 1 tablet by mouth every 4 (four) hours as needed for severe pain. (Patient not taking: Reported on 10/10/2020)  . pyrimethamine (DARAPRIM) 25 MG tablet Take 3 tablets (75 mg total) by mouth daily with breakfast. (Patient not taking:  Reported on 10/10/2020)  . sulfamethoxazole-trimethoprim (BACTRIM DS) 800-160 MG tablet Take 1 tablet by mouth daily.  [name] terbinafine (LAMISIL AT) 1 % cream Apply 1 application topically 2 (two) times daily. (Patient not taking: Reported on 10/10/2020)  . [DISCONTINUED] SUMAtriptan (IMITREX) 50 MG tablet Take 1 tablet (50 mg total) by mouth every 2 (two) hours as needed for migraine (Maximum dose: 100 mg per dose; 200 mg per 24 hours). May repeat in 2 hours if headache persists or recurs.   No facility-administered encounter medications on file as of 10/10/2020.     Patient Active Problem List   Diagnosis Date Noted  . S/P craniotomy 05/16/2020  . Brain tumor ([place]) 05/16/2020  . 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 Hypertension   . Pain of upper abdomen   . Suicide ideation   . 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 Seizure ([place])   . Intracranial mass   . Intractable headache 02/04/2020  . Encephalitis, myelitis, 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[place]) 01/31/2020  . Rotator cuff strain 01/26/2020  . 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 AIDS (acquired immune deficiency syndrome) ([place]) 11/07/2019  . Cerebral edema ([place]) 10/28/2019  . Midline shift of brain   . Pruritus 08/29/2019  . Tendinopathy of left shoulder 01/18/2019  . Chronic pelvic pain in female 01/04/2019  . Lower abdominal pain 06/21/2018  . Syphilis 02/26/2018  . Tuberculosis   . Infertility, female 02/12/2018  . 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 Acute right-sided low back pain  with right-sided sciatica 08/24/2017  . Complex regional pain syndrome 02/03/2017  . Headache 10/28/2016  . 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 Avascular necrosis of bone of left hip ([place]) 12/14/2015  . 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 Nausea 05/18/2015  . Vertigo 01/23/2015   . Primary adrenal insufficiency ([place]) 01/03/2015  . Chest pain 07/07/2013  . HIV (human immunodeficiency virus infection) ([place]) 03/16/2012  . 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   Review of Systems 12 point ros is negative except what is mentioned above Physical Exam   BP 119/84   Pulse 100   Temp 98.1 F (36.7 C) (Oral)   Ht 5\' 3"  (1.6 m)   Wt 185 lb (83.9 kg)   SpO2 100%   BMI 32.77 kg/m   Physical Exam  Constitutional:  oriented to person, place, and time. appears well-developed and well-nourished. No distress.  HENT: [place]/AT, PERRLA, no scleral icterus Mouth/Throat: Oropharynx is clear and moist. No oropharyngeal exudate.  Cardiovascular: Normal rate, regular rhythm and normal heart sounds. Exam reveals no gallop and no friction rub.  No murmur heard.  Pulmonary/Chest: Effort normal and breath sounds normal. No respiratory distress.  has no wheezes.  Neck = supple, no nuchal rigidity Abdominal: Soft. Bowel sounds are normal.  exhibits no distension. There is no tenderness.  Lymphadenopathy: no cervical adenopathy. No axillary adenopathy Neurological: alert and oriented to person, place, and time.  Skin: Skin is warm and dry. No rash noted. No erythema.  Psychiatric: a normal mood and affect.  behavior is normal.   Lab Results  Component Value Date   CD4TCELL 5 (L) 08/13/2020   Lab Results  Component Value Date   CD4TABS 54 (L) 08/13/2020   CD4TABS 107 (L) 05/10/2020   CD4TABS 45 (L) 03/07/2020   Lab Results  Component Value Date   HIV1RNAQUANT 66,400 (H) 08/13/2020   Lab Results  Component Value Date   HEPBSAB POS (A) 09/13/2015   Lab Results  Component Value Date   LABRPR Reactive (A) 10/29/2019    CBC Lab Results  Component Value Date   WBC 5.8 08/30/2020   RBC 3.78 (L) 08/30/2020   HGB 11.3 (L) 08/30/2020   HCT 35.4 (L) 08/30/2020   PLT 249 08/30/2020   MCV  93.7 08/30/2020   MCH 29.9 08/30/2020   MCHC 31.9 08/30/2020   RDW 15.3 08/30/2020   LYMPHSABS 2.1 08/30/2020   MONOABS 0.6 08/30/2020   EOSABS 0.5 08/30/2020    BMET Lab Results  Component Value Date   NA 136 08/30/2020   K 4.1 08/30/2020   CL 104 08/30/2020   CO2 23 08/30/2020   GLUCOSE 87 08/30/2020   BUN 12 08/30/2020   CREATININE 0.89 08/30/2020   CALCIUM 9.5 08/30/2020   GFRNONAA >60 08/30/2020   GFRAA >60 08/30/2020      Assessment and Plan  HIV disease=  Will get labs for hiv disease to see if better controlled  Cns toxo = continue with atovaquone BID since she is tolerating. Will need to get copy of mri of brain study from [place] [facility]  Right lower quadrant pain with sex = dealyed in pap smear. Has had ultrasound of ovaries that showed cysts, she is being referred to women's clinic appt next month  Vaginal yeast infection = will give  fluconazole to help with symptoms  Needs pfizer 3rd dose since immunocomp  Addendum = congratulated her on great work on her adherence! Lowest her VL has been in 3 years

## 2020-10-11 LAB — T-HELPER CELL (CD4) - (RCID CLINIC ONLY)
CD4 % Helper T Cell: 8 % — ABNORMAL LOW (ref 33–65)
CD4 T Cell Abs: 190 /uL — ABNORMAL LOW (ref 400–1790)

## 2020-10-15 DIAGNOSIS — Z419 Encounter for procedure for purposes other than remedying health state, unspecified: Secondary | ICD-10-CM | POA: Diagnosis not present

## 2020-10-15 LAB — CBC WITH DIFFERENTIAL/PLATELET
Absolute Monocytes: 497 cells/uL (ref 200–950)
Basophils Absolute: 50 cells/uL (ref 0–200)
Basophils Relative: 0.7 %
Eosinophils Absolute: 986 cells/uL — ABNORMAL HIGH (ref 15–500)
Eosinophils Relative: 13.7 %
HCT: 36.8 % (ref 35.0–45.0)
Hemoglobin: 12.3 g/dL (ref 11.7–15.5)
Lymphs Abs: 2981 cells/uL (ref 850–3900)
MCH: 31.1 pg (ref 27.0–33.0)
MCHC: 33.4 g/dL (ref 32.0–36.0)
MCV: 92.9 fL (ref 80.0–100.0)
MPV: 11.1 fL (ref 7.5–12.5)
Monocytes Relative: 6.9 %
Neutro Abs: 2686 cells/uL (ref 1500–7800)
Neutrophils Relative %: 37.3 %
Platelets: 326 10*3/uL (ref 140–400)
RBC: 3.96 10*6/uL (ref 3.80–5.10)
RDW: 14.6 % (ref 11.0–15.0)
Total Lymphocyte: 41.4 %
WBC: 7.2 10*3/uL (ref 3.8–10.8)

## 2020-10-15 LAB — HIV-1 RNA QUANT-NO REFLEX-BLD
HIV 1 RNA Quant: 48 Copies/mL — ABNORMAL HIGH
HIV-1 RNA Quant, Log: 1.68 Log cps/mL — ABNORMAL HIGH

## 2020-10-18 DIAGNOSIS — D496 Neoplasm of unspecified behavior of brain: Secondary | ICD-10-CM | POA: Diagnosis not present

## 2020-10-30 ENCOUNTER — Ambulatory Visit (INDEPENDENT_AMBULATORY_CARE_PROVIDER_SITE_OTHER): Payer: Medicaid Other | Admitting: Orthopaedic Surgery

## 2020-10-30 ENCOUNTER — Ambulatory Visit (INDEPENDENT_AMBULATORY_CARE_PROVIDER_SITE_OTHER): Payer: Medicaid Other

## 2020-10-30 ENCOUNTER — Other Ambulatory Visit: Payer: Self-pay

## 2020-10-30 DIAGNOSIS — Z96641 Presence of right artificial hip joint: Secondary | ICD-10-CM

## 2020-10-30 DIAGNOSIS — M25551 Pain in right hip: Secondary | ICD-10-CM

## 2020-10-30 NOTE — Progress Notes (Signed)
Office Visit Note   Patient: Kelsey Rodriguez           Date of Birth: January 31, 1991           MRN: [id number] Visit Date: 10/30/2020              Requested by: Gerlene Fee, [place] [place],  [place] [id number] PCP: Gerlene Fee, DO   Assessment & Plan: Visit Diagnoses:  1. History of right hip replacement   2. Pain in right hip     Plan: Prior to injecting her her right hip, it does warrant a MRI of this right hip to rule out any fluid collections or other internal derangements and even prosthetic loosening.  We will order the MRI of her right hip and then see her back to go over this once we have it.  She agrees with this treatment plan.  All question concerns were answered and addressed.  Follow-Up Instructions: No follow-ups on file.  Follow-up after MRI  Orders:  Orders Placed This Encounter  Procedures  . XR HIP UNILAT W OR W/O PELVIS 1V RIGHT   No orders of the defined types were placed in this encounter.     Procedures: No procedures performed   Clinical Data: No additional findings.   Subjective: Chief Complaint  Patient presents with  . Right Hip - Pain  The patient is a 29 year old female who has a history of bilateral hip replacements done in 2017 secondary to avascular necrosis.  This was related to her HIV positive status with a high incidence of osteonecrosis and AVN of hip joints.  She has been experiencing right hip and groin pain for couple months now.  She says it hurts with walking and driving and dressing with no known injury.  She has had no other significant change in her current medical status.  HPI  Review of Systems She currently denies any fever, chills, nausea, vomiting  Objective: Vital Signs: There were no vitals taken for this visit.  Physical Exam She is alert and orient x3 and in no acute distress Ortho Exam Examination of her right hip shows it moves smoothly and fluidly and her leg lengths are equal and  her left hip also moves fluidly but she is experiencing some pain around the groin and lateral hip on the right side. Specialty Comments:  No specialty comments available.  Imaging: XR HIP UNILAT W OR W/O PELVIS 1V RIGHT  Result Date: 10/30/2020 An AP pelvis and lateral of the right hip shows bilateral hip replacements that are well-seated.  There is no evidence of loosening on plain film at all.  The stems actually appear bone ingrown as to the acetabular component.    PMFS History: Patient Active Problem List   Diagnosis Date Noted  . S/P craniotomy 05/16/2020  . Brain tumor ([place]) 05/16/2020  . 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 Hypertension   . Pain of upper abdomen   . Suicide ideation   . 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[place])   . Seizure ([place])   . Intracranial mass   . Intractable headache 02/04/2020  . Encephalitis, myelitis, 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[place]) 01/31/2020  . Rotator cuff strain 01/26/2020  . 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 AIDS (acquired immune deficiency syndrome) ([place]) 11/07/2019  . Cerebral edema ([place]) 10/28/2019  . Midline shift of brain   . Pruritus 08/29/2019  . Tendinopathy of left shoulder 01/18/2019  . Chronic pelvic pain  in female 01/04/2019  . Lower abdominal pain 06/21/2018  . Syphilis 02/26/2018  . Tuberculosis   . Infertility, female 02/12/2018  . 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 Acute right-sided low back pain with right-sided sciatica 08/24/2017  . Complex regional pain syndrome 02/03/2017  . Headache 10/28/2016  . Avascular necrosis of bone of right hip ([place]) 04/04/2016  . 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 Avascular necrosis of bone of left hip ([place]) 12/14/2015  . 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 Nausea 05/18/2015  . Vertigo 01/23/2015  . Primary adrenal insufficiency ([place]) 01/03/2015  . Chest pain 07/07/2013   . HIV (human immunodeficiency virus infection) ([place]) 03/16/2012  . 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 Adrenal insufficiency ([place])   . Anemia of chronic disease 03/11/2012  . Back pain of lumbar region with sciatica 02/12/2015  . Bell's palsy 08/26/2013  . Brain lesion   . 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 Chronic back pain   . Chronic leg pain    bilateral knees, ankles  . Depression   . Fatigue   . GERD (gastroesophageal reflux disease)   . Headache   . 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 HIV (human immunodeficiency virus infection) ([place]) 02/2012  . Laceration of ankle, right 11/18/2012  . Lumbar radiculopathy   . Meningitis 02/18/2018  . Pelvic pain   . PID (acute pelvic inflammatory disease) 02/26/2018  . Pneumonia   . Reflux esophagitis 03/11/2012  . Seizure ([place])   . Tuberculosis   . 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 Vertigo   . Wears glasses     Family History  Problem Relation Age of Onset  . Heart disease Father        Vague not clearly cardiac  . Hypertension Mother     Past Surgical History:  Procedure Laterality Date  . APPENDECTOMY  ~ 2000  . APPLICATION OF CRANIAL NAVIGATION N/A 05/16/2020   Procedure: APPLICATION OF CRANIAL NAVIGATION;  Surgeon: Newman Pies, MD;  Location: [place];  Service: Neurosurgery;  Laterality: N/A;  . CRANIOTOMY Right 05/16/2020   Procedure: Craniotomy for Resection of Lesion;  Surgeon: Newman Pies, MD;  Location: [place];  Service: Neurosurgery;  Laterality: Right;  right  . DILATION AND CURETTAGE OF UTERUS  2008  . ESOPHAGOGASTRODUODENOSCOPY  03/11/2012   Procedure: ESOPHAGOGASTRODUODENOSCOPY (EGD);  Surgeon: Lafayette Dragon, MD;  Location: [facility] ENDOSCOPY;  Service: Endoscopy;  Laterality: N/A;  . ESOPHAGOGASTRODUODENOSCOPY N/A 03/07/2014   Procedure: ESOPHAGOGASTRODUODENOSCOPY (EGD);  Surgeon: Gatha Mayer, MD;  Location: [facility]  ENDOSCOPY;  Service: Endoscopy;  Laterality: N/A;  . LUNG BIOPSY  02/2012  . TOTAL HIP ARTHROPLASTY Left 12/14/2015   Procedure: LEFT TOTAL HIP ARTHROPLASTY ANTERIOR APPROACH;  Surgeon: Mcarthur Rossetti, MD;  Location: [facility];  Service: Orthopedics;  Laterality: Left;  . TOTAL HIP ARTHROPLASTY Right 04/04/2016   Procedure: RIGHT TOTAL HIP ARTHROPLASTY ANTERIOR APPROACH;  Surgeon: Mcarthur Rossetti, MD;  Location: [facility];  Service: Orthopedics;  Laterality: Right;   Social History   Occupational History  . Occupation: CNA  Tobacco Use  . Smoking status: Never Smoker  . Smokeless tobacco: Never Used  Vaping Use  . Vaping Use: Never used  Substance and Sexual Activity  . Alcohol use: Not Currently    Alcohol/week: 0.0 standard drinks    Comment: socially  .  Drug use: No  . Sexual activity: Yes    Partners: Male    Birth control/protection: None    Comment: pt. given condoms 10/10/20

## 2020-11-01 ENCOUNTER — Encounter: Payer: Self-pay | Admitting: Obstetrics and Gynecology

## 2020-11-01 ENCOUNTER — Ambulatory Visit (INDEPENDENT_AMBULATORY_CARE_PROVIDER_SITE_OTHER): Payer: Medicaid Other | Admitting: Obstetrics and Gynecology

## 2020-11-01 ENCOUNTER — Other Ambulatory Visit (HOSPITAL_COMMUNITY)
Admission: RE | Admit: 2020-11-01 | Discharge: 2020-11-01 | Disposition: A | Discharge status: Home / Self Care | Payer: Medicare Other | Source: Ambulatory Visit | Attending: Obstetrics and Gynecology | Admitting: Obstetrics and Gynecology

## 2020-11-01 ENCOUNTER — Other Ambulatory Visit: Payer: Self-pay

## 2020-11-01 DIAGNOSIS — Z01419 Encounter for gynecological examination (general) (routine) without abnormal findings: Secondary | ICD-10-CM | POA: Diagnosis not present

## 2020-11-01 DIAGNOSIS — Z309 Encounter for contraceptive management, unspecified: Secondary | ICD-10-CM | POA: Insufficient documentation

## 2020-11-01 DIAGNOSIS — Z1151 Encounter for screening for human papillomavirus (HPV): Secondary | ICD-10-CM | POA: Diagnosis not present

## 2020-11-01 DIAGNOSIS — Z202 Contact with and (suspected) exposure to infections with a predominantly sexual mode of transmission: Secondary | ICD-10-CM | POA: Insufficient documentation

## 2020-11-01 NOTE — Progress Notes (Signed)
New GYN presents for AEX/PAP/STD screening,  c/o having 2 periods in less than a month 10/23-26/21 and 11/4-7/21, cramps 10/10 x 1 month, constipation. Dx Ovarian Cyst

## 2020-11-01 NOTE — Progress Notes (Signed)
Kelsey Rodriguez is a 29 y.o. G25P0010 female here for a routine annual gynecologic exam.  Pt has multiple medical problems as document in her medical records. To day she requests a pap smear and vaginal STD testing. She reports a long history of chronic pelvic pain and desire for pregnancy. Has been seen by [facility] minimal invasive surgery center for this problem. Last seen a little over a year ago. She was previous on Depo Provera but stopped a year ago. Monthly cycle until last month. Normal time and length of cycle in Oct and then bleed about a week later for 3 days. Bleeding has now stopped.   She still has pelvic pain daily and desires pregnancy   Gynecologic History Patient's last menstrual period was 10/06/2020 (exact date). Contraception: none Last Pap: uncertain. Results were: normal   Obstetric History OB History  Gravida Para Term Preterm AB Living  1 0 0 0 1 0  SAB TAB Ectopic Multiple Live Births  1 0 0 0      # Outcome Date GA Lbr Len/2nd Weight Sex Delivery Anes PTL Lv  1 SAB 2008             Birth Comments: D&C    Past Medical History:  Diagnosis Date  . 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 Adrenal insufficiency ([place])   . Anemia of chronic disease 03/11/2012  . Back pain of lumbar region with sciatica 02/12/2015  . Bell's palsy 08/26/2013  . Brain lesion   . 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 Chronic back pain   . Chronic leg pain    bilateral knees, ankles  . Depression   . Fatigue   . GERD (gastroesophageal reflux disease)   . Headache   . 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 HIV (human immunodeficiency virus infection) ([place]) 02/2012  . Laceration of ankle, right 11/18/2012  . Lumbar radiculopathy   . Meningitis 02/18/2018  . Pelvic pain   . PID (acute pelvic inflammatory disease) 02/26/2018  . Pneumonia   . Reflux esophagitis 03/11/2012  . Seizure ([place])   . Tuberculosis   . 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 Vertigo   . Wears glasses      Past Surgical History:  Procedure Laterality Date  . APPENDECTOMY  ~ 2000  . APPLICATION OF CRANIAL NAVIGATION N/A 05/16/2020   Procedure: APPLICATION OF CRANIAL NAVIGATION;  Surgeon: Newman Pies, MD;  Location: [place];  Service: Neurosurgery;  Laterality: N/A;  . CRANIOTOMY Right 05/16/2020   Procedure: Craniotomy for Resection of Lesion;  Surgeon: Newman Pies, MD;  Location: [place];  Service: Neurosurgery;  Laterality: Right;  right  . DILATION AND CURETTAGE OF UTERUS  2008  . ESOPHAGOGASTRODUODENOSCOPY  03/11/2012   Procedure: ESOPHAGOGASTRODUODENOSCOPY (EGD);  Surgeon: Lafayette Dragon, MD;  Location: [facility] ENDOSCOPY;  Service: Endoscopy;  Laterality: N/A;  . ESOPHAGOGASTRODUODENOSCOPY N/A 03/07/2014   Procedure: ESOPHAGOGASTRODUODENOSCOPY (EGD);  Surgeon: Gatha Mayer, MD;  Location: [facility] ENDOSCOPY;  Service: Endoscopy;  Laterality: N/A;  . LUNG BIOPSY  02/2012  . TOTAL HIP ARTHROPLASTY Left 12/14/2015   Procedure: LEFT TOTAL HIP ARTHROPLASTY ANTERIOR APPROACH;  Surgeon: Mcarthur Rossetti, MD;  [facility];  Service: Orthopedics;  Laterality: Left;  . TOTAL HIP ARTHROPLASTY Right 04/04/2016   Procedure: RIGHT TOTAL HIP ARTHROPLASTY ANTERIOR APPROACH;  Surgeon: Mcarthur Rossetti, MD;  [facility];  Service: Orthopedics;  Laterality: Right;    Current Outpatient Medications on File Prior to Visit  Medication Sig Dispense Refill  .  amitriptyline (ELAVIL) 10 MG tablet Take 3 tablets (30 mg total) by mouth at bedtime. 90 tablet 11  . atovaquone (MEPRON) 750 MG/5ML suspension Take 10 mLs (1,500 mg total) by mouth 2 (two) times daily with a meal. 600 mL 11  . bictegravir-emtricitabine-tenofovir AF (BIKTARVY) 50-200-25 MG TABS tablet Take 1 tablet by mouth daily. 30 tablet 5  . bisacodyl (DULCOLAX) 5 MG EC tablet Take 1 tablet (5 mg total) by mouth daily as needed for moderate constipation. 30 tablet 1  . fluconazole (DIFLUCAN) 150 MG tablet Take 1 tablet (150 mg total) by  mouth daily. 5 tablet 0  . ibuprofen (ADVIL) 800 MG tablet Take 800 mg by mouth 3 (three) times daily as needed.    [name] leucovorin (WELLCOVORIN) 15 MG tablet Take 1 tablet (15 mg total) by mouth daily. 20 tablet 0  . polyethylene glycol (MIRALAX MIX-IN PAX) 17 g packet Take 17 g by mouth daily. 14 each 0  . [DISCONTINUED] SUMAtriptan (IMITREX) 50 MG tablet Take 1 tablet (50 mg total) by mouth every 2 (two) hours as needed for migraine (Maximum dose: 100 mg per dose; 200 mg per 24 hours). May repeat in 2 hours if headache persists or recurs. 10 tablet 0   No current facility-administered medications on file prior to visit.    Allergies  Allergen Reactions  . Hydrocodone Itching and Nausea Only    Tolerates Oxycodone  . Tramadol Itching and Nausea Only    Tolerates oxycodone    Social History   Socioeconomic History  . Marital status: Married    Spouse name: Not on file  . Number of children: 0  . Years of education: college  . Highest education level: High school graduate  Occupational History  . Occupation: CNA  Tobacco Use  . Smoking status: Never Smoker  . Smokeless tobacco: Never Used  Vaping Use  . Vaping Use: Never used  Substance and Sexual Activity  . Alcohol use: Not Currently    Alcohol/week: 0.0 standard drinks    Comment: socially  . Drug use: No  . Sexual activity: Yes    Partners: Male    Birth control/protection: None    Comment: pt. given condoms 10/10/20  Other Topics Concern  . 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     Drinks about 1 soda a day    Social Determinants of Health   Financial Resource Strain:   . Difficulty of Paying Living Expenses: Not on file  Food Insecurity:   . Worried About Charity fundraiser in the Last Year: Not on file  . Ran Out of Food in the Last Year: Not on file  Transportation Needs:   . Lack of Transportation (Medical): Not on file  . Lack of Transportation (Non-Medical): Not  on file  Physical Activity:   . Days of Exercise per Week: Not on file  . Minutes of Exercise per Session: Not on file  Stress:   . Feeling of Stress : Not on file  Social Connections:   . Frequency of Communication with Friends and Family: Not on file  . Frequency of Social Gatherings with Friends and Family: Not on file  . Attends Religious Services: Not on file  . Active Member of Clubs or Organizations: Not on file  . Attends Archivist Meetings: Not on file  . Marital Status: Not on file  Intimate Partner Violence:   . Fear of  Current or Ex-Partner: Not on file  . Emotionally Abused: Not on file  . Physically Abused: Not on file  . Sexually Abused: Not on file    Family History  Problem Relation Age of Onset  . Heart disease Father        Vague not clearly cardiac  . Hypertension Mother     The following portions of the patient's history were reviewed and updated as appropriate: allergies, current medications, past family history, past medical history, past social history, past surgical history and problem list.  Review of Systems Pertinent items are noted in HPI.   Objective:  BP 125/90   Pulse (!) 108   Ht 5\' 5"  (1.651 m)   Wt 188 lb (85.3 kg)   LMP 10/06/2020 (Exact Date)   BMI 31.28 kg/m  CONSTITUTIONAL: Well-developed, well-nourished female in no acute distress.  HENT:  Normocephalic, atraumatic, External right and left ear normal. Oropharynx is clear and moist EYES: Conjunctivae and EOM are normal. Pupils are equal, round, and reactive to light. No scleral icterus.  NECK: Normal range of motion, supple, no masses.  Normal thyroid.  SKIN: Skin is warm and dry. No rash noted. Not diaphoretic. No erythema. No pallor. [place]: Alert and oriented to person, place, and time. Normal reflexes, muscle tone coordination. No cranial nerve deficit noted. PSYCHIATRIC: Normal mood and affect. Normal behavior. Normal judgment and thought content. CARDIOVASCULAR:  Normal heart rate noted, regular rhythm RESPIRATORY: Clear to auscultation bilaterally. Effort and breath sounds normal, no problems with respiration noted. BREASTS: deferred ABDOMEN: Soft, normal bowel sounds, no distention noted.  No tenderness, rebound or guarding.  PELVIC: Normal appearing external genitalia; normal appearing vaginal mucosa and cervix.  No abnormal discharge noted.  Pap smear obtained.  Normal uterine size, no other palpable masses, no uterine or adnexal tenderness. MUSCULOSKELETAL: Normal range of motion. No tenderness.  No cyanosis, clubbing, or edema.  2+ distal pulses.   Assessment:  Annual gynecologic examination with pap smear  STD exposure Chronic pelvic pain Desire for pregnancy Plan:  Will follow up results of pap smear and manage accordingly. Recommend pt follow up with [facility] minimal invasive surgery clinic. POC has previously been outline for her in relationship to her known problems and desires. Pt and partner verbalized understanding and she will call them for an appt.  Routine preventative health maintenance measures emphasized. Please refer to After Visit Summary for other counseling recommendations.    Chancy Milroy, MD, [place] Attending [place] for [facility], [place]

## 2020-11-01 NOTE — Patient Instructions (Signed)
Health Maintenance, Female Adopting a healthy lifestyle and getting preventive care are important in promoting health and wellness. Ask your health care provider about:  The right schedule for you to have regular tests and exams.  Things you can do on your own to prevent diseases and keep yourself healthy. What should I know about diet, weight, and exercise? Eat a healthy diet   Eat a diet that includes plenty of vegetables, fruits, low-fat dairy products, and lean protein.  Do not eat a lot of foods that are high in solid fats, added sugars, or sodium. Maintain a healthy weight Body mass index (BMI) is used to identify weight problems. It estimates body fat based on height and weight. Your health care provider can help determine your BMI and help you achieve or maintain a healthy weight. Get regular exercise Get regular exercise. This is one of the most important things you can do for your health. Most adults should:  Exercise for at least 150 minutes each week. The exercise should increase your heart rate and make you sweat (moderate-intensity exercise).  Do strengthening exercises at least twice a week. This is in addition to the moderate-intensity exercise.  Spend less time sitting. Even light physical activity can be beneficial. Watch cholesterol and blood lipids Have your blood tested for lipids and cholesterol at 31 years of age, then have this test every 5 years. Have your cholesterol levels checked more often if:  Your lipid or cholesterol levels are high.  You are older than 31 years of age.  You are at high risk for heart disease. What should I know about cancer screening? Depending on your health history and family history, you may need to have cancer screening at various ages. This may include screening for:  Breast cancer.  Cervical cancer.  Colorectal cancer.  Skin cancer.  Lung cancer. What should I know about heart disease, diabetes, and high blood  pressure? Blood pressure and heart disease  High blood pressure causes heart disease and increases the risk of stroke. This is more likely to develop in people who have high blood pressure readings, are of African descent, or are overweight.  Have your blood pressure checked: ? Every 3-5 years if you are 18-39 years of age. ? Every year if you are 40 years old or older. Diabetes Have regular diabetes screenings. This checks your fasting blood sugar level. Have the screening done:  Once every three years after age 40 if you are at a normal weight and have a low risk for diabetes.  More often and at a younger age if you are overweight or have a high risk for diabetes. What should I know about preventing infection? Hepatitis B If you have a higher risk for hepatitis B, you should be screened for this virus. Talk with your health care provider to find out if you are at risk for hepatitis B infection. Hepatitis C Testing is recommended for:  Everyone born from 1945 through 1965.  Anyone with known risk factors for hepatitis C. Sexually transmitted infections (STIs)  Get screened for STIs, including gonorrhea and chlamydia, if: ? You are sexually active and are younger than 31 years of age. ? You are older than 31 years of age and your health care provider tells you that you are at risk for this type of infection. ? Your sexual activity has changed since you were last screened, and you are at increased risk for chlamydia or gonorrhea. Ask your health care provider if   you are at risk.  Ask your health care provider about whether you are at high risk for HIV. Your health care provider may recommend a prescription medicine to help prevent HIV infection. If you choose to take medicine to prevent HIV, you should first get tested for HIV. You should then be tested every 3 months for as long as you are taking the medicine. Pregnancy  If you are about to stop having your period (premenopausal) and  you may become pregnant, seek counseling before you get pregnant.  Take 400 to 800 micrograms (mcg) of folic acid every day if you become pregnant.  Ask for birth control (contraception) if you want to prevent pregnancy. Osteoporosis and menopause Osteoporosis is a disease in which the bones lose minerals and strength with aging. This can result in bone fractures. If you are 65 years old or older, or if you are at risk for osteoporosis and fractures, ask your health care provider if you should:  Be screened for bone loss.  Take a calcium or vitamin D supplement to lower your risk of fractures.  Be given hormone replacement therapy (HRT) to treat symptoms of menopause. Follow these instructions at home: Lifestyle  Do not use any products that contain nicotine or tobacco, such as cigarettes, e-cigarettes, and chewing tobacco. If you need help quitting, ask your health care provider.  Do not use street drugs.  Do not share needles.  Ask your health care provider for help if you need support or information about quitting drugs. Alcohol use  Do not drink alcohol if: ? Your health care provider tells you not to drink. ? You are pregnant, may be pregnant, or are planning to become pregnant.  If you drink alcohol: ? Limit how much you use to 0-1 drink a day. ? Limit intake if you are breastfeeding.  Be aware of how much alcohol is in your drink. In the [place] one drink equals one 12 oz bottle of beer (355 mL), one 5 oz glass of wine (148 mL), or one 1 oz glass of hard liquor (44 mL). General instructions  Schedule regular health, dental, and eye exams.  Stay current with your vaccines.  Tell your health care provider if: ? You often feel depressed. ? You have ever been abused or do not feel safe at home. Summary  Adopting a healthy lifestyle and getting preventive care are important in promoting health and wellness.  Follow your health care provider's instructions about healthy  diet, exercising, and getting tested or screened for diseases.  Follow your health care provider's instructions on monitoring your cholesterol and blood pressure. This information is not intended to replace advice given to you by your health care provider. Make sure you discuss any questions you have with your health care provider. Document Revised: 11/24/2018 Document Reviewed: 11/24/2018 Elsevier Patient Education  2020 Elsevier Inc.  

## 2020-11-02 LAB — CERVICOVAGINAL ANCILLARY ONLY
Bacterial Vaginitis (gardnerella): NEGATIVE
Candida Glabrata: NEGATIVE
Candida Vaginitis: NEGATIVE
Chlamydia: NEGATIVE
Comment: NEGATIVE
Comment: NEGATIVE
Comment: NEGATIVE
Comment: NEGATIVE
Comment: NEGATIVE
Comment: NORMAL
Neisseria Gonorrhea: NEGATIVE
Trichomonas: NEGATIVE

## 2020-11-05 DIAGNOSIS — K5900 Constipation, unspecified: Secondary | ICD-10-CM | POA: Diagnosis not present

## 2020-11-05 DIAGNOSIS — B589 Toxoplasmosis, unspecified: Secondary | ICD-10-CM | POA: Diagnosis not present

## 2020-11-05 DIAGNOSIS — B20 Human immunodeficiency virus [HIV] disease: Secondary | ICD-10-CM | POA: Diagnosis not present

## 2020-11-05 DIAGNOSIS — M25559 Pain in unspecified hip: Secondary | ICD-10-CM | POA: Diagnosis not present

## 2020-11-05 DIAGNOSIS — D279 Benign neoplasm of unspecified ovary: Secondary | ICD-10-CM | POA: Diagnosis not present

## 2020-11-06 LAB — CYTOLOGY - PAP
Comment: NEGATIVE
Comment: NEGATIVE
HPV 16: POSITIVE — AB
HPV 18 / 45: NEGATIVE
High risk HPV: POSITIVE — AB

## 2020-11-06 MED FILL — BIKTARVY 50-200-25 MG TABS: 50-200-25 | 30 days supply | Qty: 30 | Fill #4

## 2020-11-07 ENCOUNTER — Telehealth: Payer: Self-pay

## 2020-11-07 NOTE — Telephone Encounter (Signed)
TC to pt regarding abnormal pap results and need for COLPO no answer LVM

## 2020-11-13 NOTE — Telephone Encounter (Signed)
-----   Message from Chancy Milroy, MD sent at 11/07/2020  9:38 AM EST ----- Please schedule pt for colposcopy. Thanks Legrand Como

## 2020-11-14 ENCOUNTER — Telehealth: Payer: Self-pay | Admitting: *Deleted

## 2020-11-14 ENCOUNTER — Telehealth: Payer: Self-pay | Admitting: Orthopaedic Surgery

## 2020-11-14 DIAGNOSIS — Z419 Encounter for procedure for purposes other than remedying health state, unspecified: Secondary | ICD-10-CM | POA: Diagnosis not present

## 2020-11-14 NOTE — Telephone Encounter (Signed)
Called pt 1X left vm to call and set MRI review appt with Dr. Ninfa Linden after 11/18/20

## 2020-11-14 NOTE — Telephone Encounter (Signed)
Patient called to see if 1) we can offer colposcopy here in the clinic, and 2) when she should get her covid booster, as she is traveling to [place] 12/9 for a funeral. She asked if we offer any evening appointments for boosters so her husband could come with her after work. RN advised patient that gynecology does colposcopy, she will follow up there. RN asked patient to consider getting her covid booster today so that it would provide as much protection as possible by the time she travels to [place] on 12/9.  She is driving with her husband. She and her husband will try to schedule evening appointment with local pharmacy this week. Landis Gandy, RN

## 2020-11-18 ENCOUNTER — Ambulatory Visit
Admission: RE | Admit: 2020-11-18 | Discharge: 2020-11-18 | Disposition: A | Discharge status: Home / Self Care | Payer: Medicaid Other | Source: Ambulatory Visit | Attending: Orthopaedic Surgery | Admitting: Orthopaedic Surgery

## 2020-11-18 DIAGNOSIS — Z96641 Presence of right artificial hip joint: Secondary | ICD-10-CM

## 2020-11-19 ENCOUNTER — Ambulatory Visit: Payer: Medicaid Other | Admitting: Internal Medicine

## 2020-11-19 ENCOUNTER — Telehealth: Payer: Self-pay

## 2020-11-19 NOTE — Telephone Encounter (Signed)
[facility] wanted to let Dr. Ninfa Linden know that MRI results for Right Hip are in patient's chart.  Please advise.  Thank you.

## 2020-11-20 ENCOUNTER — Other Ambulatory Visit: Payer: Self-pay

## 2020-11-20 ENCOUNTER — Encounter: Payer: Self-pay | Admitting: Internal Medicine

## 2020-11-20 ENCOUNTER — Ambulatory Visit: Payer: Medicaid Other | Admitting: Internal Medicine

## 2020-11-20 VITALS — BP 123/80 | HR 97 | Temp 98.3°F | Wt 188.0 lb

## 2020-11-20 DIAGNOSIS — B582 Toxoplasma meningoencephalitis: Secondary | ICD-10-CM

## 2020-11-20 DIAGNOSIS — B20 Human immunodeficiency virus [HIV] disease: Secondary | ICD-10-CM | POA: Diagnosis not present

## 2020-11-20 DIAGNOSIS — R21 Rash and other nonspecific skin eruption: Secondary | ICD-10-CM | POA: Diagnosis not present

## 2020-11-20 DIAGNOSIS — Z91199 Patient's noncompliance with other medical treatment and regimen due to unspecified reason: Secondary | ICD-10-CM

## 2020-11-20 DIAGNOSIS — Z9119 Patient's noncompliance with other medical treatment and regimen: Secondary | ICD-10-CM | POA: Diagnosis not present

## 2020-11-20 MED ORDER — BICTEGRAVIR-EMTRICITAB-TENOFOV 50-200-25 MG PO TABS: 1.0000 | ORAL_TABLET | Freq: Every day | ORAL | 5 refills | Status: DC

## 2020-11-20 NOTE — Progress Notes (Signed)
RFV: follow up in hiv disease and CNS toxo  Patient ID: Kelsey Rodriguez, female   DOB: [birth year], 29 y.o.   MRN: [id number]  HPI 29yo F with hiv disease, and CNS toxo. Has not missed any of mepron doses, but has had seven missing doses of biktarvy. Her repeat mri still showing lesions but clinically she reports less headahces. Not having blurry/double vision. She continues to Follow with gyn about chronic pelvic pain. Found to have abn pap smear. Needs to get colposcopy  She lost her MIL, going to [place] for funeral in the coming weeks.  Noticing itching to face, shoulder, arms and torso.  Outpatient Encounter Medications as of 11/20/2020  Medication Sig  . atovaquone (MEPRON) 750 MG/5ML suspension Take 10 mLs (1,500 mg total) by mouth 2 (two) times daily with a meal.  . bictegravir-emtricitabine-tenofovir AF (BIKTARVY) 50-200-25 MG TABS tablet Take 1 tablet by mouth daily.  [name] ibuprofen (ADVIL) 800 MG tablet Take 800 mg by mouth 3 (three) times daily as needed.   [name] amitriptyline (ELAVIL) 10 MG tablet Take 3 tablets (30 mg total) by mouth at bedtime. (Patient not taking: Reported on 11/20/2020)  . bisacodyl (DULCOLAX) 5 MG EC tablet Take 1 tablet (5 mg total) by mouth daily as needed for moderate constipation. (Patient not taking: Reported on 11/20/2020)  . fluconazole (DIFLUCAN) 150 MG tablet Take 1 tablet (150 mg total) by mouth daily. (Patient not taking: Reported on 11/20/2020)  . leucovorin (WELLCOVORIN) 15 MG tablet Take 1 tablet (15 mg total) by mouth daily. (Patient not taking: Reported on 11/20/2020)  . oxyCODONE-acetaminophen (PERCOCET/ROXICET) 5-325 MG tablet Take 1 tablet by mouth 2 (two) times daily as needed.  . polyethylene glycol (MIRALAX MIX-IN PAX) 17 g packet Take 17 g by mouth daily. (Patient not taking: Reported on 11/20/2020)  . [DISCONTINUED] SUMAtriptan (IMITREX) 50 MG tablet Take 1 tablet (50 mg total) by mouth every 2 (two) hours as needed for migraine (Maximum  dose: 100 mg per dose; 200 mg per 24 hours). May repeat in 2 hours if headache persists or recurs.   No facility-administered encounter medications on file as of 11/20/2020.     Patient Active Problem List   Diagnosis Date Noted  . Visit for routine gyn exam 11/01/2020  . STD exposure 11/01/2020  . S/P craniotomy 05/16/2020  . Brain tumor ([place]) 05/16/2020  . 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 Hypertension   . Pain of upper abdomen   . Suicide ideation   . 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 Seizure ([place])   . Intracranial mass   . Intractable headache 02/04/2020  . Encephalitis, myelitis, 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[place]) 01/31/2020  . Rotator cuff strain 01/26/2020  . 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 AIDS (acquired immune deficiency syndrome) ([place]) 11/07/2019  . Cerebral edema ([place]) 10/28/2019  . Midline shift of brain   . Pruritus 08/29/2019  . Tendinopathy of left shoulder 01/18/2019  . Chronic pelvic pain in female 01/04/2019  . Lower abdominal pain 06/21/2018  . Syphilis 02/26/2018  . Tuberculosis   . Infertility, female 02/12/2018  . 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 Acute right-sided low back pain with right-sided sciatica 08/24/2017  . Complex regional pain syndrome 02/03/2017  . Headache 10/28/2016  . Avascular necrosis of bone of right hip ([place]) 04/04/2016  . 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 Avascular necrosis of bone of left hip ([place]) 12/14/2015  .  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 Nausea 05/18/2015  . Vertigo 01/23/2015  . Primary adrenal insufficiency ([place]) 01/03/2015  . Chest pain 07/07/2013  . HIV (human immunodeficiency virus infection) ([place]) 03/16/2012  . 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   Review of Systems +rash, 12 point ros is  otherwise negative Physical Exam   BP 123/80   Pulse 97   Temp 98.3 F (36.8 C) (Oral)   Wt 188 lb (85.3 kg)   LMP 11/19/2020   SpO2 100%   BMI 31.28 kg/m   Physical Exam  Constitutional:  oriented to person, place, and time. appears well-developed and well-nourished. No distress.  HENT: [place]/AT, PERRLA, no scleral icterus Mouth/Throat: Oropharynx is clear and moist. No oropharyngeal exudate.  Cardiovascular: Normal rate, regular rhythm and normal heart sounds. Exam reveals no gallop and no friction rub.  No murmur heard.  Pulmonary/Chest: Effort normal and breath sounds normal. No respiratory distress.  has no wheezes.  Neck = supple, no nuchal rigidity Abdominal: Soft. Bowel sounds are normal.  exhibits no distension. There is no tenderness.  Lymphadenopathy: no cervical adenopathy. No axillary adenopathy Neurological: alert and oriented to person, place, and time.  Skin: Skin is warm and dry. Slight raised small lesions Psychiatric: a normal mood and affect.  behavior is normal.   Lab Results  Component Value Date   CD4TCELL 8 (L) 10/10/2020   Lab Results  Component Value Date   CD4TABS 190 (L) 10/10/2020   CD4TABS 54 (L) 08/13/2020   CD4TABS 107 (L) 05/10/2020   Lab Results  Component Value Date   HIV1RNAQUANT 48 (H) 10/10/2020   Lab Results  Component Value Date   HEPBSAB POS (A) 09/13/2015   Lab Results  Component Value Date   LABRPR Reactive (A) 10/29/2019    CBC Lab Results  Component Value Date   WBC 7.2 10/10/2020   RBC 3.96 10/10/2020   HGB 12.3 10/10/2020   HCT 36.8 10/10/2020   PLT 326 10/10/2020   MCV 92.9 10/10/2020   MCH 31.1 10/10/2020   MCHC 33.4 10/10/2020   RDW 14.6 10/10/2020   LYMPHSABS 2,981 10/10/2020   MONOABS 0.6 08/30/2020   EOSABS 986 (H) 10/10/2020    BMET Lab Results  Component Value Date   NA 136 08/30/2020   K 4.1 08/30/2020   CL 104 08/30/2020   CO2 23 08/30/2020   GLUCOSE 87 08/30/2020   BUN 12 08/30/2020    CREATININE 0.89 08/30/2020   CALCIUM 9.5 08/30/2020   GFRNONAA >60 08/30/2020   GFRAA >60 08/30/2020      Assessment and Plan  Pruritis= switch detergent- scent free.hydrocortisone cream. Benadryl. If that is not improved, not sure if related to mepron. Though we don't have many other options to treat toxo  Cns toxo = continue with mepron  hiv disease= will check labs and get pill box. Reinforce importance of daily adherence. She is interested in injectables. To revisit at next appt.  Medication adherence counseling =spent 10 min discussing ways to improve adherence

## 2020-11-21 LAB — T-HELPER CELL (CD4) - (RCID CLINIC ONLY)
CD4 % Helper T Cell: 8 % — ABNORMAL LOW (ref 33–65)
CD4 T Cell Abs: 214 /uL — ABNORMAL LOW (ref 400–1790)

## 2020-11-22 LAB — HIV-1 RNA QUANT-NO REFLEX-BLD
HIV 1 RNA Quant: 61500 Copies/mL — ABNORMAL HIGH
HIV-1 RNA Quant, Log: 4.79 Log cps/mL — ABNORMAL HIGH

## 2020-11-23 ENCOUNTER — Telehealth: Payer: Self-pay

## 2020-11-23 NOTE — Telephone Encounter (Signed)
Is this for the MRI?

## 2020-11-23 NOTE — Telephone Encounter (Signed)
[place] from [facility] stated she received a authorization fax she stated she needs the fax to clarify which specific procedure is he requesting and what the diagnosis codes are. She also needs correct location for our office and phone number listed on the form. CB:[phone] EXT.[place]

## 2020-11-29 ENCOUNTER — Ambulatory Visit (INDEPENDENT_AMBULATORY_CARE_PROVIDER_SITE_OTHER): Payer: Medicaid Other | Admitting: Orthopaedic Surgery

## 2020-11-29 ENCOUNTER — Encounter: Payer: Self-pay | Admitting: Orthopaedic Surgery

## 2020-11-29 DIAGNOSIS — M25551 Pain in right hip: Secondary | ICD-10-CM

## 2020-11-29 DIAGNOSIS — Z96641 Presence of right artificial hip joint: Secondary | ICD-10-CM

## 2020-11-29 NOTE — Progress Notes (Signed)
The patient comes in today to go over MRI of her right hip.  It seems like most of her pain though is in the low back and the SI joint areas and pelvis.  I am able to easily move her right hip through internal and external rotation.  She does have a history of a right total hip arthroplasty.  She also has other significant comorbidities including a history of toxoplasmosis in the past as well as HIV.  She is currently followed by Dr. Graylon Good of the infectious disease clinic.  She does report chronic fatigue for well over a year.  Her right hip moves smoothly and fluidly with no pain at all on my exam.  The MRI of her right hip is normal.  It did not show any complicating features of the hip replacement.  Of note she does have enlarged lymph nodes bilaterally at the external iliac chain.  This may be from chronic issues.  I told her she needs to share these results with her primary care physician and even with the infectious disease service.  From my standpoint there is nothing else I can recommend for her hips.  There is a little bit of fluid in the SI joint to the right side but I will think this is significant.  I do agree with the fact that she cannot perform any type of sedentary or manual labor as a relates to her significant comorbidities I am happy to attest to this for disability purposes.  All questions and concerns were answered and addressed.  Follow-up is as needed.

## 2020-11-30 ENCOUNTER — Ambulatory Visit: Payer: Medicaid Other

## 2020-12-03 MED FILL — BIKTARVY 50-200-25 MG TABS: 50-200-25 | 30 days supply | Qty: 30 | Fill #4

## 2020-12-06 ENCOUNTER — Other Ambulatory Visit: Payer: Self-pay

## 2020-12-06 ENCOUNTER — Encounter: Payer: Self-pay | Admitting: Obstetrics and Gynecology

## 2020-12-06 ENCOUNTER — Encounter: Payer: Medicaid Other | Admitting: Obstetrics and Gynecology

## 2020-12-06 ENCOUNTER — Other Ambulatory Visit (HOSPITAL_COMMUNITY)
Admission: RE | Admit: 2020-12-06 | Discharge: 2020-12-06 | Disposition: A | Discharge status: Home / Self Care | Payer: Medicaid Other | Source: Ambulatory Visit | Attending: Obstetrics and Gynecology | Admitting: Obstetrics and Gynecology

## 2020-12-06 ENCOUNTER — Ambulatory Visit (INDEPENDENT_AMBULATORY_CARE_PROVIDER_SITE_OTHER): Payer: Medicaid Other | Admitting: Obstetrics and Gynecology

## 2020-12-06 DIAGNOSIS — N870 Mild cervical dysplasia: Secondary | ICD-10-CM

## 2020-12-06 HISTORY — DX: Mild cervical dysplasia: N87.0

## 2020-12-06 LAB — POCT URINE PREGNANCY: Preg Test, Ur: NEGATIVE

## 2020-12-06 NOTE — Progress Notes (Signed)
Pt presents for colposcopy biopsy  LGSIL HPV 16 High Risk HPV on pap 11/01/20 UPT neg

## 2020-12-06 NOTE — Patient Instructions (Signed)
Colposcopy, Care After This sheet gives you information about how to care for yourself after your procedure. Your doctor may also give you more specific instructions. If you have problems or questions, contact your doctor. What can I expect after the procedure? If you did not have a tissue sample removed (did not have a biopsy), you may only have some spotting for a few days. You can go back to your normal activities. If you had a tissue sample removed, it is common to have:  Soreness and pain. This may last for a few days.  Light-headedness.  Mild bleeding from your vagina or dark-colored, grainy discharge from your vagina. This may last for a few days. You may need to wear a sanitary pad.  Spotting for at least 48 hours after the procedure. Follow these instructions at home:   Take over-the-counter and prescription medicines only as told by your doctor. Ask your doctor what medicines you can start taking again. This is very important if you take blood-thinning medicine.  Do not drive or use heavy machinery while taking prescription pain medicine.  For 3 days, or as long as your doctor tells you, avoid: ? Douching. ? Using tampons. ? Having sex.  If you use birth control (contraception), keep using it.  Limit activity for the first day after the procedure. Ask your doctor what activities are safe for you.  It is up to you to get the results of your procedure. Ask your doctor when your results will be ready.  Keep all follow-up visits as told by your doctor. This is important. Contact a doctor if:  You get a skin rash. Get help right away if:  You are bleeding a lot from your vagina. It is a lot of bleeding if you are using more than one pad an hour for 2 hours in a row.  You have clumps of blood (blood clots) coming from your vagina.  You have a fever.  You have chills  You have pain in your lower belly (pelvic area).  You have signs of infection, such as vaginal  discharge that is: ? Different than usual. ? Yellow. ? Bad-smelling.  You have very pain or cramps in your lower belly that do not get better with medicine.  You feel light-headed.  You feel dizzy.  You pass out (faint). Summary  If you did not have a tissue sample removed (did not have a biopsy), you may only have some spotting for a few days. You can go back to your normal activities.  If you had a tissue sample removed, it is common to have mild pain and spotting for 48 hours.  For 3 days, or as long as your doctor tells you, avoid douching, using tampons and having sex.  Get help right away if you have bleeding, very bad pain, or signs of infection. This information is not intended to replace advice given to you by your health care provider. Make sure you discuss any questions you have with your health care provider. Document Revised: 11/13/2017 Document Reviewed: 08/20/2016 Elsevier Patient Education  [place].   Ibuprofen 800 mg every 8 hours can be taken as needed x 2 doses   Cervical Dysplasia  Cervical dysplasia is a condition in which a woman's cervix cells have abnormal changes. The cervix is the opening of the uterus (womb). It is located between the vagina and the uterus. Cervical dysplasia may be an early sign of cervical cancer. If left untreated, this condition  may become more severe and may progress to cervical cancer. Early detection, treatment, and follow-up care are very important. What are the causes? Cervical dysplasia can be caused by a human papillomavirus (HPV) infection. HPV is the most common sexually transmitted infection (STI). HPV is spread from person to person through sexual contact. This includes oral, vaginal, or anal sex. What increases the risk? The following factors may make you more likely to develop this condition:  Having had a sexually transmitted infection (STI), such as herpes, chlamydia or HPV.  Becoming sexually active  before age 69.  Having had more than one sexual partner.  Having a sexual partner who has multiple sexual partners.  Not using protection, such as a condom, during sex, especially with new sexual partners.  Having a history of cancer of the vagina or vulva.  Having a weakened body defense (immune) system.  Being the daughter of a woman who took diethylstilbestrol (DES) during pregnancy.  Having a family history of cervical cancer.  Smoking.  Using oral contraceptives, also called birth control pills.  Having had three or more full-term pregnancies. What are the signs or symptoms? There are usually no symptoms of this condition. If you do have symptoms, they may include:  Abnormal vaginal discharge.  Bleeding between periods or after sex.  Bleeding during menopause.  Pain during sex (dyspareunia). How is this diagnosed? A test called a Pap test may be done. During this test, cells are taken from the cervix and then examined under a microscope. A test in which tissue is removed from the cervix (biopsy) may also be done if the Pap test is abnormal or if the cervix looks abnormal. How is this treated? Treatment varies based on the severity of the condition. Treatment may include:  Cryotherapy. During cryotherapy, the abnormal cells are frozen with a steel-tip instrument.  Loop electrosurgical excision procedure (LEEP). This procedure removes abnormal tissue from the cervix.  Surgery to remove abnormal tissue. This is usually done in more advanced cases. Surgical options include: ? A cone biopsy. This is a procedure in which the cervical canal and a portion of the center of the cervix are removed. ? Hysterectomy. This is a surgery in which the uterus and cervix are removed. Follow these instructions at home:  Take over-the-counter and prescription medicines only as told by your health care provider.  Do not use tampons, have sex, or douche until your health care provider  says it is okay.  Keep follow-up visits as told by your health care provider. This is important. Women who have been treated for cervical dysplasia should have regular pelvic exams and Pap tests as told by their health care provider. How is this prevented?  Practice safe sex to help prevent sexually transmitted infections (STI) that may cause this condition.  Have regular Pap tests. Talk with your health care provider about how often you need these tests. Pap tests will help identify cell changes that can lead to cancer. Contact a health care provider if:  You develop genital warts. Get help right away if:  You have a fever.  You have abnormal vaginal discharge.  Your menstrual period is heavier than normal.  You develop bright red bleeding. This may include blood clots.  You have pain or cramps that get worse, and medicine does not help to relieve your pain.  You feel light-headed and you are unusually weak.  You have fainting spells.  You have pain in the abdomen. Summary  Cervical dysplasia is  a condition in which a woman's cervix cells have abnormal changes.  If left untreated, this condition may become more severe and may progress to cervical cancer.  Early detection, treatment, and follow-up care are very important in managing this condition.  Have regular pelvic exams and Pap tests. Talk with your health care provider about how often you need these tests. Pap tests will help identify cell changes that can lead to cancer. This information is not intended to replace advice given to you by your health care provider. Make sure you discuss any questions you have with your health care provider. Document Revised: 09/22/2018 Document Reviewed: 12/04/2016 Elsevier Patient Education  2020 [employer].

## 2020-12-06 NOTE — Progress Notes (Signed)
Patient given informed consent, signed copy in the chart, time out was performed. UPT was negative.   Placed in lithotomy position. Cervix viewed with speculum and colposcope after application of acetic acid. No lesions were noted so lugol's solution was applied.  No positive reaction was noted here as well.  ECC was then taken without incident.  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  no Acetowhite lesions? no Punctation? no Mosaicism?  no Abnormal vasculature?  no Biopsies? none ECC? yes  COMMENTS:  Patient was given post procedure instructions.  She will receive results by MyChart or phone call  Griffin Basil, MD  Faculty attending, [facility] for [facility]

## 2020-12-10 LAB — SURGICAL PATHOLOGY

## 2020-12-13 ENCOUNTER — Other Ambulatory Visit: Payer: Medicaid Other

## 2020-12-15 DIAGNOSIS — Z419 Encounter for procedure for purposes other than remedying health state, unspecified: Secondary | ICD-10-CM | POA: Diagnosis not present

## 2020-12-27 ENCOUNTER — Encounter: Payer: Medicaid Other | Admitting: Internal Medicine

## 2020-12-31 ENCOUNTER — Ambulatory Visit: Payer: Medicaid Other | Admitting: Adult Health

## 2021-01-03 ENCOUNTER — Ambulatory Visit: Payer: Medicaid Other | Admitting: Adult Health

## 2021-01-09 ENCOUNTER — Other Ambulatory Visit (HOSPITAL_COMMUNITY): Payer: Self-pay | Admitting: Internal Medicine

## 2021-01-09 DIAGNOSIS — B20 Human immunodeficiency virus [HIV] disease: Secondary | ICD-10-CM | POA: Diagnosis not present

## 2021-01-09 DIAGNOSIS — K089 Disorder of teeth and supporting structures, unspecified: Secondary | ICD-10-CM | POA: Diagnosis not present

## 2021-01-09 DIAGNOSIS — G4489 Other headache syndrome: Secondary | ICD-10-CM | POA: Diagnosis not present

## 2021-01-09 DIAGNOSIS — B353 Tinea pedis: Secondary | ICD-10-CM | POA: Diagnosis not present

## 2021-01-09 MED FILL — CLOTRIMAZOLE-BETAMETHASONE: 1-0.05 | 15 days supply | Qty: 30 | Fill #0

## 2021-01-09 MED FILL — PENICILLIN VK 500 MG TABLET: 500 | 7 days supply | Qty: 28 | Fill #0

## 2021-01-11 MED FILL — BIKTARVY 50-200-25 MG TABS: 50-200-25 | 30 days supply | Qty: 30 | Fill #5

## 2021-01-15 DIAGNOSIS — Z419 Encounter for procedure for purposes other than remedying health state, unspecified: Secondary | ICD-10-CM | POA: Diagnosis not present

## 2021-02-07 ENCOUNTER — Telehealth: Payer: Self-pay

## 2021-02-07 ENCOUNTER — Other Ambulatory Visit: Payer: Self-pay

## 2021-02-07 DIAGNOSIS — B20 Human immunodeficiency virus [HIV] disease: Secondary | ICD-10-CM

## 2021-02-07 MED ORDER — BICTEGRAVIR-EMTRICITAB-TENOFOV 50-200-25 MG PO TABS: 1.0000 | ORAL_TABLET | Freq: Every day | ORAL | 5 refills | Status: DC

## 2021-02-07 NOTE — Telephone Encounter (Signed)
Received refill request for Biktarvy from [facility]. Last prescription sent to [facility]. Spoke with patient who states she would prefer to have E[facility]ian filled at [place] for mail order use. Other prescriptions can be sent to [facility]. Vm left with [facility] to cancel Rx.

## 2021-02-12 DIAGNOSIS — Z419 Encounter for procedure for purposes other than remedying health state, unspecified: Secondary | ICD-10-CM | POA: Diagnosis not present

## 2021-02-14 MED FILL — BIKTARVY 50-200-25 MG TABS: 50-200-25 | 30 days supply | Qty: 30 | Fill #0

## 2021-02-25 ENCOUNTER — Ambulatory Visit: Payer: Medicaid Other | Admitting: Internal Medicine

## 2021-02-27 ENCOUNTER — Other Ambulatory Visit: Payer: Self-pay

## 2021-02-27 ENCOUNTER — Telehealth (INDEPENDENT_AMBULATORY_CARE_PROVIDER_SITE_OTHER): Payer: Medicaid Other | Admitting: Internal Medicine

## 2021-02-27 DIAGNOSIS — B20 Human immunodeficiency virus [HIV] disease: Secondary | ICD-10-CM | POA: Diagnosis not present

## 2021-02-27 DIAGNOSIS — B582 Toxoplasma meningoencephalitis: Secondary | ICD-10-CM

## 2021-02-27 DIAGNOSIS — Z9114 Patient's other noncompliance with medication regimen: Secondary | ICD-10-CM | POA: Diagnosis not present

## 2021-02-27 NOTE — Progress Notes (Signed)
Virtual Visit via Telephone Note  I connected with Kelsey Rodriguez on 02/27/21 at 10:45 AM EDT by telephone and verified that I am speaking with the correct person using two identifiers.  Location: Patient: at home Provider: in clinic   I discussed the limitations, risks, security and privacy concerns of performing an evaluation and management service by telephone and the availability of in person appointments. I also discussed with the patient that there may be a patient responsible charge related to this service. The patient expressed understanding and agreed to proceed.   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  She is also has had lapse with adherence to biktarvy, cd4 ct 214/61K noted in dec 2022. Missing 6-7 doses in a month. But this past week taking it every day.  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   rtc in 4-6 wk  Follow Up Instructions:    I discussed the assessment and treatment plan with the patient. The patient was provided an opportunity to ask questions and all were answered. The patient agreed with the plan and demonstrated an understanding of the instructions.   The patient was advised to call back or seek an in-person evaluation if the symptoms worsen or if the condition fails to improve as anticipated.  I provided 10 minutes of non-face-to-face time during this encounter.   Carlyle Basques, MD

## 2021-03-04 ENCOUNTER — Telehealth: Payer: Self-pay

## 2021-03-04 ENCOUNTER — Other Ambulatory Visit (HOSPITAL_COMMUNITY): Payer: Self-pay | Admitting: Internal Medicine

## 2021-03-04 NOTE — Telephone Encounter (Signed)
Patient called office today requesting script for yeast infection. States that she is currently on penicillin after having her wisdom teeth removed. States when she is taking antibiotics she usually suffers from yeast infections.  Is currently having vaginal discharge and itching. Would like prescription sent to [facility] on [address]. Advised she also contact PCP.

## 2021-03-05 MED FILL — FLUCONAZOLE 150 MG TABS: 150 | 14 days supply | Qty: 2 | Fill #0

## 2021-03-06 NOTE — Telephone Encounter (Signed)
Called patient regarding prescription. States that PCP has already sent prescription into pharmacy for her. Does not need rx at this time.

## 2021-03-06 NOTE — Telephone Encounter (Signed)
Yes please give fluc 150mg  daily x 3 doses

## 2021-03-07 ENCOUNTER — Other Ambulatory Visit (HOSPITAL_COMMUNITY): Payer: Self-pay

## 2021-03-07 DIAGNOSIS — B20 Human immunodeficiency virus [HIV] disease: Secondary | ICD-10-CM | POA: Diagnosis not present

## 2021-03-07 DIAGNOSIS — N979 Female infertility, unspecified: Secondary | ICD-10-CM | POA: Diagnosis not present

## 2021-03-07 DIAGNOSIS — N7011 Chronic salpingitis: Secondary | ICD-10-CM | POA: Diagnosis not present

## 2021-03-07 DIAGNOSIS — N9489 Other specified conditions associated with female genital organs and menstrual cycle: Secondary | ICD-10-CM | POA: Diagnosis not present

## 2021-03-07 DIAGNOSIS — R102 Pelvic and perineal pain: Secondary | ICD-10-CM | POA: Diagnosis not present

## 2021-03-13 MED FILL — BIKTARVY 50-200-25 MG TABS: 50-200-25 | 30 days supply | Qty: 30 | Fill #1

## 2021-03-15 DIAGNOSIS — Z419 Encounter for procedure for purposes other than remedying health state, unspecified: Secondary | ICD-10-CM | POA: Diagnosis not present

## 2021-03-21 ENCOUNTER — Other Ambulatory Visit (HOSPITAL_COMMUNITY): Payer: Self-pay

## 2021-03-28 ENCOUNTER — Other Ambulatory Visit: Payer: Self-pay

## 2021-03-28 ENCOUNTER — Other Ambulatory Visit: Payer: Medicaid Other

## 2021-03-28 DIAGNOSIS — B20 Human immunodeficiency virus [HIV] disease: Secondary | ICD-10-CM | POA: Diagnosis not present

## 2021-03-29 LAB — T-HELPER CELL (CD4) - (RCID CLINIC ONLY)
CD4 % Helper T Cell: 8 % — ABNORMAL LOW (ref 33–65)
CD4 T Cell Abs: 136 /uL — ABNORMAL LOW (ref 400–1790)

## 2021-04-02 NOTE — Telephone Encounter (Signed)
Dorie, can you write a letter on my behalf and I can sign? Something on the lines that he provides emotional but not sure if that will work in terms of immigration purposes.

## 2021-04-08 ENCOUNTER — Other Ambulatory Visit (HOSPITAL_COMMUNITY): Payer: Self-pay

## 2021-04-10 ENCOUNTER — Other Ambulatory Visit (HOSPITAL_COMMUNITY): Payer: Self-pay

## 2021-04-11 ENCOUNTER — Other Ambulatory Visit (HOSPITAL_COMMUNITY): Payer: Self-pay

## 2021-04-12 ENCOUNTER — Other Ambulatory Visit: Payer: Self-pay | Admitting: Obstetrics and Gynecology

## 2021-04-12 DIAGNOSIS — N979 Female infertility, unspecified: Secondary | ICD-10-CM

## 2021-04-14 ENCOUNTER — Inpatient Hospital Stay (HOSPITAL_COMMUNITY)
Admission: EM | Admit: 2021-04-14 | Discharge: 2021-04-19 | DRG: 758 | Disposition: A | Discharge status: Home / Self Care | Payer: Medicare Other | Attending: Obstetrics and Gynecology | Admitting: Obstetrics and Gynecology

## 2021-04-14 ENCOUNTER — Encounter (HOSPITAL_COMMUNITY): Payer: Self-pay | Admitting: Emergency Medicine

## 2021-04-14 ENCOUNTER — Emergency Department (HOSPITAL_COMMUNITY): Payer: Medicare Other

## 2021-04-14 DIAGNOSIS — Z8611 Personal history of tuberculosis: Secondary | ICD-10-CM

## 2021-04-14 DIAGNOSIS — N7093 Salpingitis and oophoritis, unspecified: Secondary | ICD-10-CM | POA: Diagnosis not present

## 2021-04-14 DIAGNOSIS — B589 Toxoplasmosis, unspecified: Secondary | ICD-10-CM | POA: Diagnosis present

## 2021-04-14 DIAGNOSIS — R109 Unspecified abdominal pain: Secondary | ICD-10-CM | POA: Diagnosis not present

## 2021-04-14 DIAGNOSIS — Z96643 Presence of artificial hip joint, bilateral: Secondary | ICD-10-CM | POA: Diagnosis present

## 2021-04-14 DIAGNOSIS — Z419 Encounter for procedure for purposes other than remedying health state, unspecified: Secondary | ICD-10-CM | POA: Diagnosis not present

## 2021-04-14 DIAGNOSIS — Z9114 Patient's other noncompliance with medication regimen: Secondary | ICD-10-CM

## 2021-04-14 DIAGNOSIS — Z20822 Contact with and (suspected) exposure to covid-19: Secondary | ICD-10-CM | POA: Diagnosis present

## 2021-04-14 DIAGNOSIS — B582 Toxoplasma meningoencephalitis: Secondary | ICD-10-CM

## 2021-04-14 DIAGNOSIS — B20 Human immunodeficiency virus [HIV] disease: Secondary | ICD-10-CM

## 2021-04-14 LAB — URINALYSIS, ROUTINE W REFLEX MICROSCOPIC
Bilirubin Urine: NEGATIVE
Glucose, UA: NEGATIVE mg/dL
Hgb urine dipstick: NEGATIVE
Ketones, ur: NEGATIVE mg/dL
Nitrite: NEGATIVE
Protein, ur: 30 mg/dL — AB
Specific Gravity, Urine: 1.032 — ABNORMAL HIGH (ref 1.005–1.030)
pH: 5 (ref 5.0–8.0)

## 2021-04-14 LAB — CBC
HCT: 37.1 % (ref 36.0–46.0)
Hemoglobin: 12.1 g/dL (ref 12.0–15.0)
MCH: 31.3 pg (ref 26.0–34.0)
MCHC: 32.6 g/dL (ref 30.0–36.0)
MCV: 95.9 fL (ref 80.0–100.0)
Platelets: 173 10*3/uL (ref 150–400)
RBC: 3.87 MIL/uL (ref 3.87–5.11)
RDW: 14.2 % (ref 11.5–15.5)
WBC: 11.4 10*3/uL — ABNORMAL HIGH (ref 4.0–10.5)
nRBC: 0 % (ref 0.0–0.2)

## 2021-04-14 LAB — COMPREHENSIVE METABOLIC PANEL
ALT: 19 U/L (ref 0–44)
AST: 24 U/L (ref 15–41)
Albumin: 3.8 g/dL (ref 3.5–5.0)
Alkaline Phosphatase: 81 U/L (ref 38–126)
Anion gap: 10 (ref 5–15)
BUN: 12 mg/dL (ref 6–20)
CO2: 20 mmol/L — ABNORMAL LOW (ref 22–32)
Calcium: 9.3 mg/dL (ref 8.9–10.3)
Chloride: 104 mmol/L (ref 98–111)
Creatinine, Ser: 0.73 mg/dL (ref 0.44–1.00)
GFR, Estimated: >60 mL/min (ref >60)
Glucose, Bld: 95 mg/dL (ref 70–99)
Potassium: 3.6 mmol/L (ref 3.5–5.1)
Sodium: 134 mmol/L — ABNORMAL LOW (ref 135–145)
Total Bilirubin: 0.5 mg/dL (ref 0.3–1.2)
Total Protein: 7.9 g/dL (ref 6.5–8.1)

## 2021-04-14 LAB — LACTIC ACID, PLASMA
Lactic Acid, Venous: 1.5 mmol/L (ref 0.5–1.9)
Lactic Acid, Venous: 1 mmol/L (ref 0.5–1.9)

## 2021-04-14 LAB — LIPASE, BLOOD: Lipase: 30 U/L (ref 11–51)

## 2021-04-14 LAB — PREGNANCY, URINE: Preg Test, Ur: NEGATIVE

## 2021-04-14 MED ORDER — DOXYCYCLINE HYCLATE 100 MG PO TABS
100.0000 mg | ORAL_TABLET | Freq: Two times a day (BID) | ORAL | Status: DC
  Administered 2021-04-15 – 2021-04-19 (×10): 100 mg via ORAL
  Filled 2021-04-14 (×10): qty 1

## 2021-04-14 MED ORDER — LACTATED RINGERS IV BOLUS
1000.0000 mL | Freq: Once | INTRAVENOUS | Status: AC
  Administered 2021-04-14: 1000 mL via INTRAVENOUS

## 2021-04-14 MED ORDER — FENTANYL CITRATE (PF) 100 MCG/2ML IJ SOLN
50.0000 ug | Freq: Once | INTRAMUSCULAR | Status: AC
  Administered 2021-04-14: 50 ug via INTRAVENOUS
  Filled 2021-04-14: qty 2

## 2021-04-14 MED ORDER — LACTATED RINGERS IV SOLN: INTRAVENOUS | Status: AC

## 2021-04-14 MED ORDER — ACETAMINOPHEN 500 MG PO TABS
1000.0000 mg | ORAL_TABLET | Freq: Once | ORAL | Status: AC
  Administered 2021-04-14: 1000 mg via ORAL
  Filled 2021-04-14: qty 2

## 2021-04-14 MED ORDER — SODIUM CHLORIDE 0.9 % IV SOLN
2.0000 g | Freq: Once | INTRAVENOUS | Status: AC
  Administered 2021-04-15: 2 g via INTRAVENOUS
  Filled 2021-04-14: qty 2

## 2021-04-14 MED ORDER — METRONIDAZOLE 500 MG PO TABS
500.0000 mg | ORAL_TABLET | Freq: Once | ORAL | Status: AC
  Administered 2021-04-15: 500 mg via ORAL
  Filled 2021-04-14: qty 1

## 2021-04-14 MED ORDER — IOHEXOL 300 MG/ML  SOLN
100.0000 mL | Freq: Once | INTRAMUSCULAR | Status: AC | PRN
  Administered 2021-04-14: 100 mL via INTRAVENOUS

## 2021-04-14 NOTE — ED Triage Notes (Addendum)
C/o lower abd pain since last night that is worse on L side.  States she has had intermittent pain since 2015 with intermittent nausea.  Denies nausea, vomiting, and diarrhea at present.  States she was seen at [facility] and being treated for pelvic pain. States she was told Dr would order imaging but hasn't heard back in 2 weeks.  Took Ibuprofen 800mg  at 12:30.

## 2021-04-14 NOTE — ED Provider Notes (Signed)
[facility] EMERGENCY DEPARTMENT Provider Note   CSN: [id number] Arrival date & time: 04/14/21  1413     History Chief Complaint  Patient presents with  . Abdominal Pain    Kelsey Rodriguez is a 30 y.o. female.  HPI Patient is a 30 year old female with an extensive medical history including HIV with development of AIDS, anemia of chronic disease, seizures, multiple surgeries due to avascular necrosis history of AIDS defining illnesses presenting with 6 weeks of waxing waning abdominal pain with acute worsening over the past 48 hours.  Patient endorses fevers at home and severe abdominal pain without nausea vomiting diarrhea.  Patient without vaginal discharge or new sexual partners.  Patient without dysuria symptoms. Patient has been ambulatory tolerating p.o. intake but the pain became out of control today.  Patient has not tried anything for the pain which is sharp in nature in her left lower quadrant.  Patient is status post appendectomy.  Past Medical History:  Diagnosis Date  . 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 Adrenal insufficiency ([place])   . Anemia of chronic disease 03/11/2012  . Back pain of lumbar region with sciatica 02/12/2015  . Bell's palsy 08/26/2013  . Brain lesion   . 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 Chronic back pain   . Chronic leg pain    bilateral knees, ankles  . Depression   . Fatigue   . GERD (gastroesophageal reflux disease)   . Headache   . 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 HIV (human immunodeficiency virus infection) ([place]) 02/2012  . Laceration of ankle, right 11/18/2012  . Lumbar radiculopathy   . Meningitis 02/18/2018  . Pelvic pain   . PID (acute pelvic inflammatory disease) 02/26/2018  . Pneumonia   . Reflux esophagitis 03/11/2012  . Seizure ([place])   . Tuberculosis   . 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 Vertigo   . Wears glasses     Patient Active Problem List   Diagnosis Date Noted  . 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 Visit for routine gyn exam 11/01/2020  . STD exposure 11/01/2020  . S/P craniotomy 05/16/2020  . Brain tumor ([place]) 05/16/2020  . 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 Hypertension   . Pain of upper abdomen   . Suicide ideation   . 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 Seizure ([place])   . Intracranial mass   . Intractable headache 02/04/2020  . Encephalitis, myelitis, 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[place]) 01/31/2020  . Rotator cuff strain 01/26/2020  . 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 AIDS (acquired immune deficiency syndrome) ([place]) 11/07/2019  . Cerebral edema ([place]) 10/28/2019  . Midline shift of brain   . Pruritus 08/29/2019  . Tendinopathy of left shoulder 01/18/2019  . Chronic pelvic pain in female 01/04/2019  . Lower abdominal pain 06/21/2018  . Syphilis 02/26/2018  . Tuberculosis   . Infertility, female 02/12/2018  . 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 Acute right-sided low back pain with right-sided sciatica 08/24/2017  . Complex regional pain syndrome 02/03/2017  . Headache 10/28/2016  . Avascular necrosis of bone of right hip ([place]) 04/04/2016  . 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 Avascular necrosis of bone of left hip ([place]) 12/14/2015  . 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 Nausea 05/18/2015  . Vertigo 01/23/2015  . Primary adrenal insufficiency ([place]) 01/03/2015  . Chest pain 07/07/2013  . HIV (  human immunodeficiency virus infection) ([place]) 03/16/2012  . 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 APPENDECTOMY  ~ 2000  . APPLICATION OF CRANIAL NAVIGATION N/A 05/16/2020   Procedure: APPLICATION OF CRANIAL NAVIGATION;  Surgeon: Newman Pies, MD;  Location: [facility];  Service: Neurosurgery;  Laterality: N/A;  . CRANIOTOMY Right 05/16/2020   Procedure: Craniotomy for  Resection of Lesion;  Surgeon: Newman Pies, MD;  Location: [facility];  Service: Neurosurgery;  Laterality: Right;  right  . DILATION AND CURETTAGE OF UTERUS  2008  . ESOPHAGOGASTRODUODENOSCOPY  03/11/2012   Procedure: ESOPHAGOGASTRODUODENOSCOPY (EGD);  Surgeon: Lafayette Dragon, MD;  Location: [facility] ENDOSCOPY;  Service: Endoscopy;  Laterality: N/A;  . ESOPHAGOGASTRODUODENOSCOPY N/A 03/07/2014   Procedure: ESOPHAGOGASTRODUODENOSCOPY (EGD);  Surgeon: Gatha Mayer, MD;  Location: [facility] ENDOSCOPY;  Service: Endoscopy;  Laterality: N/A;  . LUNG BIOPSY  02/2012  . TOTAL HIP ARTHROPLASTY Left 12/14/2015   Procedure: LEFT TOTAL HIP ARTHROPLASTY ANTERIOR APPROACH;  Surgeon: Mcarthur Rossetti, MD;  Location: [facility];  Service: Orthopedics;  Laterality: Left;  . TOTAL HIP ARTHROPLASTY Right 04/04/2016   Procedure: RIGHT TOTAL HIP ARTHROPLASTY ANTERIOR APPROACH;  Surgeon: Mcarthur Rossetti, MD;  Location: [facility];  Service: Orthopedics;  Laterality: Right;     OB History    Gravida  1   Para  0   Term  0   Preterm  0   AB  1   Living  0     SAB  1   IAB  0   Ectopic  0   Multiple  0   Live Births              Family History  Problem Relation Age of Onset  . Heart disease Father        Vague not clearly cardiac  . Hypertension Mother     Social History   Tobacco Use  . Smoking status: Never Smoker  . Smokeless tobacco: Never Used  Vaping Use  . Vaping Use: Never used  Substance Use Topics  . Alcohol use: Not Currently    Alcohol/week: 0.0 standard drinks    Comment: socially  . Drug use: No    Home Medications Prior to Admission medications   Medication Sig Start Date End Date Taking? Authorizing Provider  atovaquone (MEPRON) 750 MG/5ML suspension TAKE 10 MLS BY MOUTH TWICE DAILY WITH A MEAL 08/27/20 08/27/21  Kuppelweiser, Cassie L, RPH-CPP  bictegravir-emtricitabine-tenofovir AF (BIKTARVY) 50-200-25 MG TABS tablet TAKE 1 TABLET BY MOUTH DAILY. 02/07/21 02/07/22   Carlyle Basques, MD  clotrimazole-betamethasone (LOTRISONE) cream APPLY TOPICALLY TO AFFECTED AREA TWICE DAILY AS NEEDED 01/09/21 01/09/22  Nolene Ebbs, MD  fluconazole (DIFLUCAN) 150 MG tablet TAKE 1 TABLET BY MOUTH WEEKLY AS NEEDED 03/04/21 03/04/22  Nolene Ebbs, MD  ibuprofen (ADVIL) 800 MG tablet Take 800 mg by mouth 3 (three) times daily as needed.  09/10/20   [provider]  ibuprofen (ADVIL) 800 MG tablet TAKE 1 TABLET BY MOUTH THREE TIMES DAILY AS NEEDED. 01/09/21 01/09/22  Nolene Ebbs, MD  oxyCODONE-acetaminophen (PERCOCET/ROXICET) 5-325 MG tablet Take 1 tablet by mouth 2 (two) times daily as needed. 11/05/20   [provider]  penicillin v potassium (VEETID) 500 MG tablet TAKE 1 TABLET BY MOUTH EVERY 6 HOURS. 01/09/21 01/09/22  Nolene Ebbs, MD  sulfamethoxazole-trimethoprim (BACTRIM DS) 800-160 MG tablet TAKE 1 TABLET BY MOUTH ONCE DAILY 07/17/20 07/17/21  Kuppelweiser, Cassie L, RPH-CPP  SUMAtriptan (IMITREX) 50 MG tablet Take 1 tablet (50 mg total) by mouth every 2 (two) hours as needed for migraine (Maximum dose: 100 mg per dose; 200 mg per 24 hours). May repeat in 2 hours if headache persists or recurs. 10/26/19 01/31/20  Caroline More, DO    Allergies    Hydrocodone and Tramadol  Review of Systems   Review of Systems  Constitutional: Positive for fever. Negative for chills.  HENT: Negative for ear pain and sore throat.   Eyes: Negative for pain and visual disturbance.  Respiratory: Negative for cough and shortness of breath.   Cardiovascular: Negative for chest pain and palpitations.  Gastrointestinal: Positive for abdominal pain. Negative for vomiting.  Genitourinary: Negative for dysuria and hematuria.  Musculoskeletal: Negative for arthralgias and back pain.  Skin: Negative for color change and rash.  Neurological: Negative for seizures and syncope.  All other systems reviewed and are negative.   Physical Exam Updated Vital Signs BP 122/87   Pulse  83   Temp 99.1 F (37.3 C) (Oral)   Resp 15   LMP 03/31/2021   SpO2 100%   Physical Exam Vitals and nursing note reviewed.  Constitutional:      General: She is not in acute distress.    Appearance: She is well-developed.  HENT:     Head: Normocephalic and atraumatic.  Eyes:     Conjunctiva/sclera: Conjunctivae normal.  Cardiovascular:     Rate and Rhythm: Normal rate and regular rhythm.     Heart sounds: No murmur heard.   Pulmonary:     Effort: Pulmonary effort is normal. No respiratory distress.     Breath sounds: Normal breath sounds.  Abdominal:     General: There is no distension.     Palpations: Abdomen is soft.     Tenderness: There is abdominal tenderness in the suprapubic area and left lower quadrant. There is guarding. There is no right CVA tenderness or left CVA tenderness. Negative signs include Murphy's sign and Rovsing's sign.  Musculoskeletal:        General: No swelling or tenderness. Normal range of motion.     Cervical back: Neck supple.  Skin:    General: Skin is warm and dry.  Neurological:     General: No focal deficit present.     Mental Status: She is alert and oriented to person, place, and time. Mental status is at baseline.     Cranial Nerves: No cranial nerve deficit.     ED Results / Procedures / Treatments   Labs (all labs ordered are listed, but only abnormal results are displayed) Labs Reviewed  COMPREHENSIVE METABOLIC PANEL - Abnormal; Notable for the following components:      Result Value   Sodium 134 (*)    CO2 20 (*)    All other components within normal limits  CBC - Abnormal; Notable for the following components:   WBC 11.4 (*)    All other components within normal limits  URINALYSIS, ROUTINE W REFLEX MICROSCOPIC - Abnormal; Notable for the following components:   APPearance TURBID (*)    Specific Gravity, Urine 1.032 (*)    Protein, ur 30 (*)    Leukocytes,Ua TRACE (*)    Bacteria, UA RARE (*)    All other components  within normal limits  CULTURE, BLOOD (ROUTINE X 2)  CULTURE, BLOOD (ROUTINE X 2)  SARS CORONAVIRUS 2 (TAT 6-24 HRS)  LIPASE, BLOOD  LACTIC ACID, PLASMA  LACTIC ACID, PLASMA  PREGNANCY, URINE  RPR  CBC WITH DIFFERENTIAL/PLATELET  I-STAT BETA HCG BLOOD, ED ([facility], [facility], [facility] ONLY)  TYPE AND SCREEN  GC/CHLAMYDIA PROBE AMP ([place]) NOT AT [facility]  WET PREP  (BD AFFIRM) ([place])    EKG None  Radiology CT ABDOMEN PELVIS W CONTRAST  Result Date: 04/14/2021 CLINICAL DATA:  Lower abdominal pain. EXAM: CT ABDOMEN AND PELVIS WITH CONTRAST TECHNIQUE: Multidetector CT imaging of the abdomen and pelvis was performed using the standard protocol following bolus administration of intravenous contrast. CONTRAST:  169mL OMNIPAQUE IOHEXOL 300 MG/ML  SOLN COMPARISON:  None. FINDINGS: Lower chest: No acute abnormality. Hepatobiliary: No focal liver abnormality is seen. No gallstones or biliary dilatation. Approximately 4.3 mm focal thickening of the gallbladder wall is noted in the region of the fundus. Pancreas: Unremarkable. No pancreatic ductal dilatation or surrounding inflammatory changes. Spleen: Normal in size without focal abnormality. Adrenals/Urinary Tract: Adrenal glands are unremarkable. Kidneys are normal, without renal calculi or hydronephrosis. A 5 mm cystic appearing area is seen within the anterior aspect of the mid left kidney. The urinary bladder is normal in appearance and is limited in evaluation secondary to overlying streak artifact. Stomach/Bowel: Stomach is within normal limits. Appendix appears normal. No evidence of bowel wall thickening, distention, or inflammatory changes. Vascular/Lymphatic: No significant vascular findings are present. No enlarged abdominal or pelvic lymph nodes. Reproductive: Uterus is normal in size and appearance. A 5.1 cm x 2.4 cm cystic appearing area is seen within the right adnexa. Similar appearing 3.1 cm x 2.3 cm and 2.4 cm x 1.5 cm cystic appearing areas are  noted within the left adnexa. A 2.3 cm diameter tubular appearing fluid-filled structure is seen along the medial aspect of the left adnexa (axial CT images 49 through 71. A mild amount of para adnexal fluid and inflammatory fat stranding is seen on the left. Other: No abdominal wall hernia or abnormality. No abdominopelvic ascites. Musculoskeletal: Bilateral total hip replacements are seen with associated streak artifact and subsequently limited evaluation of the adjacent osseous and soft tissue structures. No acute osseous abnormalities are identified. IMPRESSION: 1. Bilateral ovarian cysts with additional findings consistent with a left-sided tubo-ovarian abscess. 2. Bilateral total hip replacements. Electronically Signed   By: Virgina Norfolk M.D.   On: 04/14/2021 21:07    Procedures Procedures   Medications Ordered in ED Medications  lactated ringers infusion (has no administration in time range)  cefOXitin (MEFOXIN) 2 g in sodium chloride 0.9 % 100 mL IVPB (has no administration in time range)  doxycycline (VIBRA-TABS) tablet 100 mg (has no administration in time range)  metroNIDAZOLE (FLAGYL) tablet 500 mg (has no administration in time range)  fentaNYL (SUBLIMAZE) injection 50 mcg (50 mcg Intravenous Given 04/14/21 1759)  acetaminophen (TYLENOL) tablet 1,000 mg (1,000 mg Oral Given 04/14/21 1759)  lactated ringers bolus 1,000 mL (0 mLs Intravenous Stopped 04/14/21 1924)  iohexol (OMNIPAQUE) 300 MG/ML solution 100 mL (100 mLs Intravenous Contrast Given 04/14/21 2031)    ED Course  I have reviewed the triage vital signs and the nursing notes.  Pertinent labs & imaging results that were available during my care of the patient were reviewed by me and considered in my medical decision making (see chart for details).    MDM Rules/Calculators/A&P                          Is a 31 year old female with an extensive medical history most notably for HIV with AIDS presenting with a  chief complaint of  fever, abdominal pain.  On exam, patient is exquisitely tender in the left lower quadrant and is febrile on arrival. Patient hemodynamically without evidence of sepsis at this time. Reviewed and confirmed nursing documentation for past medical history, family history, social history.   Initial Assessment:  With the patient's presentation of abdominal pain with nausea/vomiting, most likely diagnosis is colitis versus intra-abdominal infection. Other diagnoses were considered including (but not limited to) gastroenteritis, colitis, small bowel obstruction, appendicitis, cholecystitis, pancreatitis, nephrolithiasis, UTI, pyleonephritis, ruptured ectopic pregnancy, PID, testicular torsion. These are considered less likely due to history of present illness and physical exam findings.   Initial Plan: 1. CBC/CMP to evaluate for underlying infectious/metabolic etiology for patient's abdominal pain 2. Lipase to evaluate for pancreatitis 3. EKG to evaluate for cardiac source of pain 4. CTAB/Pelvis with contrast to evaluate for structural/surgical etiology of patients' severe abdominal pain. 5. Urinalysis and repeat physical assessment to evaluate for UTI/Pyelonpehritis 6. Empiric management of symptoms with escalating pain control and antiemetics as needed.  Reassessment: Patient CT abdomen pelvis with contrast resulted with evidence of TOA.  Gynecology communicated with the recommended initiation of antibiotics with cefoxitin, doxycycline, Flagyl and evaluation of gonorrhea chlamydia and wet prep.  They will plan to admit to GYN at this time.  Patient otherwise in no acute distress at this time.  Final Clinical Impression(s) / ED Diagnoses Final diagnoses:  TOA (tubo-ovarian abscess)    Rx / DC Orders ED Discharge Orders    None       Tretha Sciara, MD 04/14/21 [id number]    Sherwood Gambler, MD 04/15/21 1055

## 2021-04-14 NOTE — H&P (Signed)
Obstetrics & Gynecology H&P   Date of Admission: 04/15/2021   Requesting Provider: [facility]  Primary OBGYN: [facility] Primary Care Provider: Gerlene Fee  Reason for Admission: TOAs  History of Present Illness: [name] is a 30 y.o. G1P0010 (Patient's last menstrual period was 03/31/2021.), with the above CC. PMHx is significant for HIV, h/o bilateral hydrosalpinges, tubal factor infertility.  Patient with worsening abdominal pain today and diagnosed with TOA based on imaging (see below). Patient has not received antibiotics yet.    ROS: A 12-point review of systems was performed and negative, except as stated in the above HPI.  OBGYN History: As per HPI. OB History  Gravida Para Term Preterm AB Living  1 0 0 0 1 0  SAB IAB Ectopic Multiple Live Births  1 0 0 0      # Outcome Date GA Lbr Len/2nd Weight Sex Delivery Anes PTL Lv  1 SAB 2008             Birth Comments: D&C     Past Medical History: Past Medical History:  Diagnosis Date  . 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 Adrenal insufficiency ([place])   . Anemia of chronic disease 03/11/2012  . Back pain of lumbar region with sciatica 02/12/2015  . Bell's palsy 08/26/2013  . Brain lesion   . 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 Chronic back pain   . Chronic leg pain    bilateral knees, ankles  . Depression   . Fatigue   . GERD (gastroesophageal reflux disease)   . Headache   . 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 HIV (human immunodeficiency virus infection) ([place]) 02/2012  . Laceration of ankle, right 11/18/2012  . Lumbar radiculopathy   . Meningitis 02/18/2018  . Pelvic pain   . PID (acute pelvic inflammatory disease) 02/26/2018  . Pneumonia   . Reflux esophagitis 03/11/2012  . Seizure ([place])   . Tuberculosis   . 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 Vertigo   . Wears glasses     Past Surgical History: Past Surgical History:  Procedure Laterality Date  . APPENDECTOMY  ~ 2000  . APPLICATION OF  CRANIAL NAVIGATION N/A 05/16/2020   Procedure: APPLICATION OF CRANIAL NAVIGATION;  Surgeon: Newman Pies, MD;  Location: [facility];  Service: Neurosurgery;  Laterality: N/A;  . CRANIOTOMY Right 05/16/2020   Procedure: Craniotomy for Resection of Lesion;  Surgeon: Newman Pies, MD;  Location: [facility];  Service: Neurosurgery;  Laterality: Right;  right  . DILATION AND CURETTAGE OF UTERUS  2008  . ESOPHAGOGASTRODUODENOSCOPY  03/11/2012   Procedure: ESOPHAGOGASTRODUODENOSCOPY (EGD);  Surgeon: Lafayette Dragon, MD;  Location: [facility];  Service: Endoscopy;  Laterality: N/A;  . ESOPHAGOGASTRODUODENOSCOPY N/A 03/07/2014   Procedure: ESOPHAGOGASTRODUODENOSCOPY (EGD);  Surgeon: Gatha Mayer, MD;  Location: [facility] ENDOSCOPY;  Service: Endoscopy;  Laterality: N/A;  . LUNG BIOPSY  02/2012  . TOTAL HIP ARTHROPLASTY Left 12/14/2015   Procedure: LEFT TOTAL HIP ARTHROPLASTY ANTERIOR APPROACH;  Surgeon: Mcarthur Rossetti, MD;  Location: [facility];  Service: Orthopedics;  Laterality: Left;  . TOTAL HIP ARTHROPLASTY Right 04/04/2016   Procedure: RIGHT TOTAL HIP ARTHROPLASTY ANTERIOR APPROACH;  Surgeon: Mcarthur Rossetti, MD;  Location: [facility];  Service: Orthopedics;  Laterality: Right;    Family History:  Family History  Problem Relation Age of Onset  . Heart disease Father        Vague not clearly cardiac  . Hypertension Mother     Social History:  Social  History   Socioeconomic History  . Marital status: Married    Spouse name: Not on file  . Number of children: 0  . Years of education: college  . Highest education level: High school graduate  Occupational History  . Occupation: CNA  Tobacco Use  . Smoking status: Never Smoker  . Smokeless tobacco: Never Used  Vaping Use  . Vaping Use: Never used  Substance and Sexual Activity  . Alcohol use: Not Currently    Alcohol/week: 0.0 standard drinks    Comment: socially  . Drug use: No  . Sexual activity: Yes    Partners: Male    Birth  control/protection: None    Comment: pt. given condoms   Other Topics Concern  . 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     Drinks about 1 soda a day    Social Determinants of Radio broadcast assistant Strain: Not on file  Food Insecurity: Not on file  Transportation Needs: Not on file  Physical Activity: Not on file  Stress: Not on file  Social Connections: Not on file  Intimate Partner Violence: Not on file     Allergy: Allergies  Allergen Reactions  . Hydrocodone Itching and Nausea Only    Tolerates Oxycodone  . Tramadol Itching and Nausea Only    Tolerates oxycodone    Current Outpatient Medications: (Not in a hospital admission)    Hospital Medications: Current Facility-Administered Medications  Medication Dose Route Frequency Provider Last Rate Last Admin  . lactated ringers infusion   Intravenous Continuous Aletha Halim, MD       Current Outpatient Medications  Medication Sig Dispense Refill  . bictegravir-emtricitabine-tenofovir AF (BIKTARVY) 50-200-25 MG TABS tablet TAKE 1 TABLET BY MOUTH DAILY. 30 tablet 5  . ibuprofen (ADVIL) 800 MG tablet TAKE 1 TABLET BY MOUTH THREE TIMES DAILY AS NEEDED. 60 tablet 5  . oxyCODONE-acetaminophen (PERCOCET/ROXICET) 5-325 MG tablet Take 1 tablet by mouth 2 (two) times daily as needed.       Physical Exam:  Current Vital Signs 24h Vital Sign Ranges  T 99.1 F (37.3 C) Temp  Avg: 99.9 F (37.7 C)  Min: 99.1 F (37.3 C)  Max: 100.7 F (38.2 C)  BP 117/80 BP  Min: 111/71  Max: 122/87  HR 84 Pulse  Avg: 97.4  Min: 83  Max: 131  RR 17 Resp  Avg: 17.9  Min: 15  Max: 20  SaO2 100 % Room Air SpO2  Avg: 99.9 %  Min: 99 %  Max: 100 %       24 Hour I/O Current Shift I/O  Time Ins Outs No intake/output data recorded. No intake/output data recorded.   Patient Vitals for the past 24 hrs:  BP Temp Temp src Pulse Resp SpO2  04/14/21 2330 117/80 -- -- 84 17 100 %  04/14/21 2130  122/87 -- -- 83 15 100 %  04/14/21 2111 115/85 -- -- 87 15 100 %  04/14/21 2000 111/71 -- -- 95 16 100 %  04/14/21 1900 118/84 -- -- 94 20 100 %  04/14/21 1759 115/78 99.1 F (37.3 C) Oral (!) 104 20 100 %  04/14/21 1700 117/85 -- -- (!) 101 20 99 %  04/14/21 1501 120/83 (!) 100.7 F (38.2 C) Oral (!) 131 20 100 %    There is no height or weight on file to calculate BMI. General appearance: Well nourished, well developed female in  no acute distress.  Neck:  Supple, normal appearance, and no thyromegaly  Cardiovascular: S1, S2 normal, no murmur, rub or gallop, regular rate and rhythm Respiratory:  Clear to auscultation bilateral. Normal respiratory effort Abdomen: non distended, diffusely and minimally ttp. No peritoneal s/s.  Neuro/Psych:  Normal mood and affect.  Skin:  Warm and dry.  Extremities: no clubbing, cyanosis, or edema.    Laboratory: Negative: beta hcg, lipase, lactic acid U/a with protein, trace leukocytes, rare bacteria   Recent Labs  Lab 04/14/21 1503  WBC 11.4*  HGB 12.1  HCT 37.1  PLT 173   Recent Labs  Lab 04/14/21 1503  NA 134*  K 3.6  CL 104  CO2 20*  BUN 12  CREATININE 0.73  CALCIUM 9.3  PROT 7.9  BILITOT 0.5  ALKPHOS 81  ALT 19  AST 24  GLUCOSE 95   Imaging:  Narrative & Impression  CLINICAL DATA:  Lower abdominal pain.  EXAM: CT ABDOMEN AND PELVIS WITH CONTRAST  TECHNIQUE: Multidetector CT imaging of the abdomen and pelvis was performed using the standard protocol following bolus administration of intravenous contrast.  CONTRAST:  165mL OMNIPAQUE IOHEXOL 300 MG/ML  SOLN  COMPARISON:  None.  FINDINGS: Lower chest: No acute abnormality.  Hepatobiliary: No focal liver abnormality is seen. No gallstones or biliary dilatation. Approximately 4.3 mm focal thickening of the gallbladder wall is noted in the region of the fundus.  Pancreas: Unremarkable. No pancreatic ductal dilatation or surrounding inflammatory  changes.  Spleen: Normal in size without focal abnormality.  Adrenals/Urinary Tract: Adrenal glands are unremarkable. Kidneys are normal, without renal calculi or hydronephrosis. A 5 mm cystic appearing area is seen within the anterior aspect of the mid left kidney. The urinary bladder is normal in appearance and is limited in evaluation secondary to overlying streak artifact.  Stomach/Bowel: Stomach is within normal limits. Appendix appears normal. No evidence of bowel wall thickening, distention, or inflammatory changes.  Vascular/Lymphatic: No significant vascular findings are present. No enlarged abdominal or pelvic lymph nodes.  Reproductive: Uterus is normal in size and appearance. A 5.1 cm x 2.4 cm cystic appearing area is seen within the right adnexa. Similar appearing 3.1 cm x 2.3 cm and 2.4 cm x 1.5 cm cystic appearing areas are noted within the left adnexa. A 2.3 cm diameter tubular appearing fluid-filled structure is seen along the medial aspect of the left adnexa (axial CT images 49 through 71. A mild amount of para adnexal fluid and inflammatory fat stranding is seen on the left.  Other: No abdominal wall hernia or abnormality. No abdominopelvic ascites.  Musculoskeletal: Bilateral total hip replacements are seen with associated streak artifact and subsequently limited evaluation of the adjacent osseous and soft tissue structures.  No acute osseous abnormalities are identified.  IMPRESSION: 1. Bilateral ovarian cysts with additional findings consistent with a left-sided tubo-ovarian abscess. 2. Bilateral total hip replacements.   Electronically Signed   By: Virgina Norfolk M.D.   On: 04/14/2021 21:07     Assessment: [name] is a 30 y.o. G1P0010 (Patient's last menstrual period was 03/31/2021.) with TOA. Pt stable  Plan: Admit to GYN. Plan for IV abx with cefoxitin, doxy and flagyl. Follow up swabs to be obtained by the ED. I told her  that plan is for her to follow up with her GYN providers re: long term management given that she has known bilateral hydrosalpinges with tubal factor infertility and now with TOA from this and she needs a long term  plan to hopefully prevent future episodes. 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 She states she is not taking Mepron. I will send an inbasket to Dr. Baxter Flattery to see if there's anything they want to do while she's inpatient *FEN/GI: regular diet, MIVF *PPx: lovenox, OOB ad lib *Pain: PO PRNs *Dispo: plan for 2-3 day hospitalization  Total time taking care of the patient was 30 minutes, with greater than 50% of the time spent in face to face interaction with the patient.  Durene Romans MD Attending Center for [facility])

## 2021-04-15 ENCOUNTER — Encounter (HOSPITAL_COMMUNITY): Payer: Self-pay | Admitting: Obstetrics and Gynecology

## 2021-04-15 ENCOUNTER — Other Ambulatory Visit (HOSPITAL_COMMUNITY): Payer: Self-pay

## 2021-04-15 ENCOUNTER — Other Ambulatory Visit: Payer: Self-pay

## 2021-04-15 DIAGNOSIS — N7093 Salpingitis and oophoritis, unspecified: Secondary | ICD-10-CM

## 2021-04-15 DIAGNOSIS — Z20822 Contact with and (suspected) exposure to covid-19: Secondary | ICD-10-CM | POA: Diagnosis not present

## 2021-04-15 DIAGNOSIS — B589 Toxoplasmosis, unspecified: Secondary | ICD-10-CM | POA: Diagnosis not present

## 2021-04-15 DIAGNOSIS — N83201 Unspecified ovarian cyst, right side: Secondary | ICD-10-CM

## 2021-04-15 DIAGNOSIS — Z9114 Patient's other noncompliance with medication regimen: Secondary | ICD-10-CM | POA: Diagnosis not present

## 2021-04-15 DIAGNOSIS — Z8611 Personal history of tuberculosis: Secondary | ICD-10-CM | POA: Diagnosis not present

## 2021-04-15 DIAGNOSIS — B20 Human immunodeficiency virus [HIV] disease: Secondary | ICD-10-CM | POA: Diagnosis not present

## 2021-04-15 DIAGNOSIS — Z96643 Presence of artificial hip joint, bilateral: Secondary | ICD-10-CM | POA: Diagnosis not present

## 2021-04-15 DIAGNOSIS — B582 Toxoplasma meningoencephalitis: Secondary | ICD-10-CM | POA: Diagnosis not present

## 2021-04-15 DIAGNOSIS — N83202 Unspecified ovarian cyst, left side: Secondary | ICD-10-CM | POA: Diagnosis not present

## 2021-04-15 HISTORY — DX: Salpingitis and oophoritis, unspecified: N70.93

## 2021-04-15 LAB — CBC WITH DIFFERENTIAL/PLATELET
Abs Immature Granulocytes: 0.09 10*3/uL — ABNORMAL HIGH (ref 0.00–0.07)
Basophils Absolute: 0 10*3/uL (ref 0.0–0.1)
Basophils Relative: 0 %
Eosinophils Absolute: 0.1 10*3/uL (ref 0.0–0.5)
Eosinophils Relative: 1 %
HCT: 37 % (ref 36.0–46.0)
Hemoglobin: 12.2 g/dL (ref 12.0–15.0)
Immature Granulocytes: 1 %
Lymphocytes Relative: 9 %
Lymphs Abs: 1.1 10*3/uL (ref 0.7–4.0)
MCH: 31.2 pg (ref 26.0–34.0)
MCHC: 33 g/dL (ref 30.0–36.0)
MCV: 94.6 fL (ref 80.0–100.0)
Monocytes Absolute: 0.6 10*3/uL (ref 0.1–1.0)
Monocytes Relative: 5 %
Neutro Abs: 10.6 10*3/uL — ABNORMAL HIGH (ref 1.7–7.7)
Neutrophils Relative %: 84 %
Platelets: 175 10*3/uL (ref 150–400)
RBC: 3.91 MIL/uL (ref 3.87–5.11)
RDW: 14.3 % (ref 11.5–15.5)
WBC: 12.6 10*3/uL — ABNORMAL HIGH (ref 4.0–10.5)
nRBC: 0 % (ref 0.0–0.2)

## 2021-04-15 LAB — WET PREP, GENITAL
Clue Cells Wet Prep HPF POC: NONE SEEN
Sperm: NONE SEEN
Trich, Wet Prep: NONE SEEN
Yeast Wet Prep HPF POC: NONE SEEN

## 2021-04-15 LAB — GC/CHLAMYDIA PROBE AMP (~~LOC~~) NOT AT ARMC
Chlamydia: NEGATIVE
Comment: NEGATIVE
Comment: NORMAL
Neisseria Gonorrhea: NEGATIVE

## 2021-04-15 LAB — TYPE AND SCREEN
ABO/RH(D): O POS
Antibody Screen: NEGATIVE

## 2021-04-15 LAB — RPR
RPR Ser Ql: REACTIVE — AB
RPR Titer: 1:2 {titer}

## 2021-04-15 MED ORDER — KETOROLAC TROMETHAMINE 30 MG/ML IJ SOLN: 30.0000 mg | Freq: Four times a day (QID) | INTRAMUSCULAR | Status: DC

## 2021-04-15 MED ORDER — KETOROLAC TROMETHAMINE 30 MG/ML IJ SOLN
30.0000 mg | Freq: Four times a day (QID) | INTRAMUSCULAR | Status: AC
  Administered 2021-04-15 – 2021-04-16 (×5): 30 mg via INTRAVENOUS
  Filled 2021-04-15 (×5): qty 1

## 2021-04-15 MED ORDER — DIPHENHYDRAMINE HCL 25 MG PO CAPS
25.0000 mg | ORAL_CAPSULE | Freq: Four times a day (QID) | ORAL | Status: DC | PRN
  Administered 2021-04-17 – 2021-04-18 (×3): 25 mg via ORAL
  Filled 2021-04-15 (×4): qty 1

## 2021-04-15 MED ORDER — ACETAMINOPHEN 325 MG PO TABS: 650.0000 mg | ORAL_TABLET | Freq: Four times a day (QID) | ORAL | Status: DC | PRN

## 2021-04-15 MED ORDER — ACETAMINOPHEN 500 MG PO TABS
1000.0000 mg | ORAL_TABLET | Freq: Four times a day (QID) | ORAL | Status: DC | PRN
  Administered 2021-04-15 – 2021-04-19 (×4): 1000 mg via ORAL
  Filled 2021-04-15 (×4): qty 2

## 2021-04-15 MED ORDER — POLYETHYLENE GLYCOL 3350 17 G PO PACK
17.0000 g | PACK | Freq: Every day | ORAL | Status: DC
  Administered 2021-04-15 – 2021-04-19 (×3): 17 g via ORAL
  Filled 2021-04-15 (×4): qty 1

## 2021-04-15 MED ORDER — GABAPENTIN 300 MG PO CAPS
300.0000 mg | ORAL_CAPSULE | Freq: Two times a day (BID) | ORAL | Status: DC
  Administered 2021-04-15 – 2021-04-19 (×8): 300 mg via ORAL
  Filled 2021-04-15 (×8): qty 1

## 2021-04-15 MED ORDER — DIPHENHYDRAMINE HCL 25 MG PO CAPS
25.0000 mg | ORAL_CAPSULE | Freq: Four times a day (QID) | ORAL | Status: DC | PRN
  Administered 2021-04-15 – 2021-04-17 (×4): 25 mg via ORAL
  Filled 2021-04-15 (×3): qty 1

## 2021-04-15 MED ORDER — SODIUM CHLORIDE 0.9 % IV SOLN
2.0000 g | Freq: Four times a day (QID) | INTRAVENOUS | Status: DC
  Administered 2021-04-15 – 2021-04-19 (×16): 2 g via INTRAVENOUS
  Filled 2021-04-15 (×12): qty 2
  Filled 2021-04-15: qty 1
  Filled 2021-04-15 (×7): qty 2

## 2021-04-15 MED ORDER — ALUM & MAG HYDROXIDE-SIMETH 200-200-20 MG/5ML PO SUSP: 30.0000 mL | ORAL | Status: DC | PRN

## 2021-04-15 MED ORDER — OXYCODONE HCL 5 MG PO TABS
5.0000 mg | ORAL_TABLET | Freq: Four times a day (QID) | ORAL | Status: DC | PRN
  Administered 2021-04-15 – 2021-04-18 (×7): 5 mg via ORAL
  Filled 2021-04-15 (×7): qty 1

## 2021-04-15 MED ORDER — PRENATAL MULTIVITAMIN CH: 1.0000 | ORAL_TABLET | Freq: Every day | ORAL | Status: DC

## 2021-04-15 MED ORDER — IBUPROFEN 600 MG PO TABS
600.0000 mg | ORAL_TABLET | Freq: Four times a day (QID) | ORAL | Status: DC | PRN
  Administered 2021-04-15: 600 mg via ORAL
  Filled 2021-04-15: qty 1

## 2021-04-15 MED ORDER — HYDROMORPHONE HCL 1 MG/ML IJ SOLN
0.5000 mg | Freq: Once | INTRAMUSCULAR | Status: AC
  Administered 2021-04-15: 0.5 mg via INTRAVENOUS
  Filled 2021-04-15: qty 0.5

## 2021-04-15 MED ORDER — ENOXAPARIN SODIUM 40 MG/0.4ML IJ SOSY
40.0000 mg | PREFILLED_SYRINGE | Freq: Every day | INTRAMUSCULAR | Status: DC
  Administered 2021-04-15 – 2021-04-18 (×5): 40 mg via SUBCUTANEOUS
  Filled 2021-04-15 (×5): qty 0.4

## 2021-04-15 MED ORDER — IBUPROFEN 800 MG PO TABS: 800.0000 mg | ORAL_TABLET | Freq: Three times a day (TID) | ORAL | Status: DC

## 2021-04-15 MED ORDER — ONDANSETRON HCL 4 MG PO TABS: 4.0000 mg | ORAL_TABLET | Freq: Four times a day (QID) | ORAL | Status: DC | PRN

## 2021-04-15 MED ORDER — BICTEGRAVIR-EMTRICITAB-TENOFOV 50-200-25 MG PO TABS
1.0000 | ORAL_TABLET | Freq: Every day | ORAL | Status: DC
  Administered 2021-04-15 – 2021-04-19 (×5): 1 via ORAL
  Filled 2021-04-15 (×5): qty 1

## 2021-04-15 MED ORDER — IBUPROFEN 800 MG PO TABS
800.0000 mg | ORAL_TABLET | Freq: Three times a day (TID) | ORAL | Status: AC
  Administered 2021-04-17 – 2021-04-18 (×6): 800 mg via ORAL
  Filled 2021-04-15 (×6): qty 1

## 2021-04-15 MED ORDER — ONDANSETRON HCL 4 MG/2ML IJ SOLN
4.0000 mg | Freq: Four times a day (QID) | INTRAMUSCULAR | Status: DC | PRN
  Filled 2021-04-15: qty 2

## 2021-04-15 NOTE — Progress Notes (Addendum)
    Faculty Practice OB/GYN Attending Note  Subjective:  Patient feeling better overall, but still in pain  Admitted on 04/14/2021 for TOA (tubo-ovarian abscess).    Objective:  Blood pressure (!) 104/59, pulse 94, temperature 98.4 F (36.9 C), temperature source Oral, resp. rate 16, last menstrual period 03/31/2021, SpO2 100 %.  Last fever 100.7 5/1 1500 Gen: NAD HENT: Normocephalic, atraumatic Lungs: Normal respiratory effort Heart: Regular rate noted Abdomen: Moderate diffuse TTP, L>R Ext: 2+ DTRs, no edema, no cyanosis, negative Homan's sign  Labs: CBC Latest Ref Rng & Units 04/15/2021 04/14/2021 10/10/2020  WBC 4.0 - 10.5 K/uL 12.6(H) 11.4(H) 7.2  Hemoglobin 12.0 - 15.0 g/dL 12.2 12.1 12.3  Hematocrit 36.0 - 46.0 % 37.0 37.1 36.8  Platelets 150 - 400 K/uL 175 173 326  Cultures pending  Assessment & Plan:  30 y.o. G1P0010 admitted for bilateral TOA - Continue IV antibiotics until at least 48 hours afebrile - Analgesia switched to Toradol for better coverage, Acetaminophen and Oxycodone can be given too as needed - Continue Biktarvy for HIV infection - Continue close observation and current care.   Verita Schneiders, MD, [place] for [employer], [place]

## 2021-04-15 NOTE — Plan of Care (Signed)
Oriented to room routine,and plan of care

## 2021-04-16 DIAGNOSIS — N7093 Salpingitis and oophoritis, unspecified: Secondary | ICD-10-CM | POA: Diagnosis not present

## 2021-04-16 LAB — BASIC METABOLIC PANEL
BUN: 13 mg/dL (ref 7–25)
CO2: 24 mmol/L (ref 20–32)
Calcium: 9.7 mg/dL (ref 8.6–10.2)
Chloride: 103 mmol/L (ref 98–110)
Creat: 0.87 mg/dL (ref 0.50–1.10)
Glucose, Bld: 84 mg/dL (ref 65–99)
Potassium: 4.2 mmol/L (ref 3.5–5.3)
Sodium: 136 mmol/L (ref 135–146)

## 2021-04-16 LAB — HIV-1 INTEGRASE GENOTYPE

## 2021-04-16 LAB — SARS CORONAVIRUS 2 (TAT 6-24 HRS): SARS Coronavirus 2: NEGATIVE

## 2021-04-16 LAB — HIV-1 RNA ULTRAQUANT REFLEX TO GENTYP+
HIV 1 RNA Quant: 648 copies/mL — ABNORMAL HIGH
HIV-1 RNA Quant, Log: 2.81 Log copies/mL — ABNORMAL HIGH

## 2021-04-16 LAB — HIV-1 GENOTYPE: HIV-1 Genotype: DETECTED — AB

## 2021-04-16 LAB — T.PALLIDUM AB, TOTAL: T Pallidum Abs: NONREACTIVE

## 2021-04-16 MED ORDER — METRONIDAZOLE 500 MG/100ML IV SOLN
500.0000 mg | Freq: Three times a day (TID) | INTRAVENOUS | Status: DC
  Filled 2021-04-16: qty 100

## 2021-04-16 MED ORDER — METRONIDAZOLE 500 MG/100ML IV SOLN
500.0000 mg | Freq: Two times a day (BID) | INTRAVENOUS | Status: DC
  Administered 2021-04-16 – 2021-04-17 (×3): 500 mg via INTRAVENOUS
  Filled 2021-04-16 (×4): qty 100

## 2021-04-16 NOTE — Progress Notes (Signed)
    Faculty Practice OB/GYN Attending Note  Subjective:  Patient still reports some abdominal pain. Had fevers last night.  Last fever 100.8 5/2 20:18.   Admitted on 04/14/2021 for TOA (tubo-ovarian abscess).    Objective:  Blood pressure 111/68, pulse 89, temperature 98 F (36.7 C), temperature source Oral, resp. rate 18, last menstrual period 03/31/2021, SpO2 99 %.  Gen: NAD HENT: Normocephalic, atraumatic Lungs: Normal respiratory effort Heart: Regular rate noted Abdomen: Moderate diffuse TTP, L>R Ext: 2+ DTRs, no edema, no cyanosis, negative Homan's sign  Labs: CBC Latest Ref Rng & Units 04/15/2021 04/14/2021 10/10/2020  WBC 4.0 - 10.5 K/uL 12.6(H) 11.4(H) 7.2  Hemoglobin 12.0 - 15.0 g/dL 12.2 12.1 12.3  Hematocrit 36.0 - 46.0 % 37.0 37.1 36.8  Platelets 150 - 400 K/uL 175 173 326  Cultures pending  Assessment & Plan:  30 y.o. G1P0010 admitted for bilateral TOA - Continue IV antibiotics until at least 48 hours afebrile. No need to change regimen, still on Cefoxitin, Doxycycline and Metronidazole. - Continue Toradol, Acetaminophen and Oxycodone as needed for pain - Continue Biktarvy for HIV infection - Continue close observation and current care.   Verita Schneiders, MD, [place] for [employer], [place]

## 2021-04-16 NOTE — Plan of Care (Signed)
  Problem: Education: Goal: Knowledge of General Education information will improve Description: Including pain rating scale, medication(s)/side effects and non-pharmacologic comfort measures Outcome: Completed/Met   Problem: Coping: Goal: Level of anxiety will decrease Outcome: Completed/Met

## 2021-04-17 DIAGNOSIS — N7093 Salpingitis and oophoritis, unspecified: Principal | ICD-10-CM

## 2021-04-17 LAB — CBC WITH DIFFERENTIAL/PLATELET
Abs Immature Granulocytes: 0.04 10*3/uL (ref 0.00–0.07)
Basophils Absolute: 0 10*3/uL (ref 0.0–0.1)
Basophils Relative: 0 %
Eosinophils Absolute: 0.2 10*3/uL (ref 0.0–0.5)
Eosinophils Relative: 3 %
HCT: 30.7 % — ABNORMAL LOW (ref 36.0–46.0)
Hemoglobin: 10.5 g/dL — ABNORMAL LOW (ref 12.0–15.0)
Immature Granulocytes: 1 %
Lymphocytes Relative: 29 %
Lymphs Abs: 1.7 10*3/uL (ref 0.7–4.0)
MCH: 31.4 pg (ref 26.0–34.0)
MCHC: 34.2 g/dL (ref 30.0–36.0)
MCV: 91.9 fL (ref 80.0–100.0)
Monocytes Absolute: 0.7 10*3/uL (ref 0.1–1.0)
Monocytes Relative: 11 %
Neutro Abs: 3.4 10*3/uL (ref 1.7–7.7)
Neutrophils Relative %: 56 %
Platelets: 172 10*3/uL (ref 150–400)
RBC: 3.34 MIL/uL — ABNORMAL LOW (ref 3.87–5.11)
RDW: 14.4 % (ref 11.5–15.5)
WBC: 6 10*3/uL (ref 4.0–10.5)
nRBC: 0 % (ref 0.0–0.2)

## 2021-04-17 LAB — URINE CULTURE: Culture: 50000 — AB

## 2021-04-17 MED ORDER — ATOVAQUONE 750 MG/5ML PO SUSP
1500.0000 mg | Freq: Two times a day (BID) | ORAL | Status: DC
  Administered 2021-04-17 – 2021-04-19 (×4): 1500 mg via ORAL
  Filled 2021-04-17 (×6): qty 10

## 2021-04-17 NOTE — Progress Notes (Signed)
Subjective: Pt seen.  States she still has abdominal pain, but it is improved from previous.  She notes some vaginal bleeding, but she states she only sees it when she uses the restroom.  Mild nausea, but no emesis.  Pt is tolerating regular diet.  Last fever 5/4, 100.7 at 0408.    Pt admitted 04/14/2021 with TOA.  Objective: Vital signs in last 24 hours: Temp:  [98 F (36.7 C)-100.7 F (38.2 C)] 99.7 F (37.6 C) (05/04 0520) Pulse Rate:  [89-111] 111 (05/04 0408) Resp:  [18] 18 (05/04 0408) BP: (109-128)/(68-82) 125/77 (05/04 0408) SpO2:  [99 %-100 %] 100 % (05/04 0408) Weight change:   Intake/Output from previous day: 05/03 0701 - 05/04 0700 In: 200 [IV Piggyback:200] Out: -  Intake/Output this shift: No intake/output data recorded.  General appearance: alert, cooperative and no distress Resp: clear to auscultation bilaterally Cardio: regular rate and rhythm GI: moderate bilateral lower abdominal pain, no rebound Extremities: Homans sign is negative, no sign of DVT and no calf pain bilaterally  Lab Results: Recent Labs    04/14/21 1503 04/15/21 0424  WBC 11.4* 12.6*  HGB 12.1 12.2  HCT 37.1 37.0  PLT 173 175   BMET:  Recent Labs    04/14/21 1503  NA 134*  K 3.6  CL 104  CO2 20*  GLUCOSE 95  BUN 12  CREATININE 0.73  CALCIUM 9.3    I have reviewed the patient's current medications.  Assessment/Plan: 30 yo G1P0 admitted for bilateral TOA Pt had recent fever, continue abx at this time, continue current regimen Pain control as previous, toradol, tylenol and oxycodone prn Recheck CBC today  Continue observation   LOS: 2 days   Griffin Basil, MD Faculty attending, Center for [facility] 04/17/2021,7:02 AM

## 2021-04-18 DIAGNOSIS — N7093 Salpingitis and oophoritis, unspecified: Secondary | ICD-10-CM | POA: Diagnosis not present

## 2021-04-18 NOTE — Progress Notes (Signed)
    Faculty Practice OB/GYN Attending Note  Subjective:  Patient reports mild abdominal pain, markedly improved from admission,. No fevers for over 24 hours.  Last fever 100.7 5/4 0408.  Eating well, ambulating.  Admitted on 04/14/2021 for TOA (tubo-ovarian abscess).    Objective:  Blood pressure 113/73, pulse 78, temperature 98.2 F (36.8 C), temperature source Oral, resp. rate 18, last menstrual period 03/31/2021, SpO2 99 %.  Gen: NAD HENT: Normocephalic, atraumatic Lungs: Normal respiratory effort Heart: Regular rate noted Abdomen: Mild diffuse TTP, L>R Ext: 2+ DTRs, no edema, no cyanosis, negative Homan's sign  Labs: CBC Latest Ref Rng & Units 04/17/2021 04/15/2021 04/14/2021  WBC 4.0 - 10.5 K/uL 6.0 12.6(H) 11.4(H)  Hemoglobin 12.0 - 15.0 g/dL 10.5(L) 12.2 12.1  Hematocrit 36.0 - 46.0 % 30.7(L) 37.0 37.1  Platelets 150 - 400 K/uL 172 175 173  Cultures pending  Assessment & Plan:  30 y.o. G1P0010 admitted for bilateral TOA - Continue IV antibiotics until at least 48 hours afebrile. No need to change regimen, still on Cefoxitin, Doxycycline. - Continue Toradol, Acetaminophen and Oxycodone as needed for pain - Continue Biktarvy and Mepron for HIV infection - Continue close observation and current care - Possible discharge tomorrow if remains stable   Verita Schneiders, MD, [place], Product/process development scientist for [employer], [place]

## 2021-04-19 ENCOUNTER — Other Ambulatory Visit (HOSPITAL_COMMUNITY): Payer: Self-pay

## 2021-04-19 DIAGNOSIS — B20 Human immunodeficiency virus [HIV] disease: Secondary | ICD-10-CM | POA: Diagnosis not present

## 2021-04-19 DIAGNOSIS — B582 Toxoplasma meningoencephalitis: Secondary | ICD-10-CM | POA: Diagnosis not present

## 2021-04-19 DIAGNOSIS — N7093 Salpingitis and oophoritis, unspecified: Secondary | ICD-10-CM | POA: Diagnosis not present

## 2021-04-19 LAB — CULTURE, BLOOD (ROUTINE X 2)
Culture: NO GROWTH
Culture: NO GROWTH
Special Requests: ADEQUATE
Special Requests: ADEQUATE

## 2021-04-19 MED ORDER — BICTEGRAVIR-EMTRICITAB-TENOFOV 50-200-25 MG PO TABS
1.0000 | ORAL_TABLET | Freq: Every day | ORAL | 11 refills | Status: DC
  Filled 2021-04-19: qty 30, 30d supply, fill #0
  Filled 2021-05-06 – 2021-05-14 (×2): qty 30, 30d supply, fill #1
  Filled 2021-06-28: qty 30, 30d supply, fill #2
  Filled 2021-08-27: qty 30, 30d supply, fill #3
  Filled 2021-09-18: qty 30, 30d supply, fill #4
  Filled 2021-10-14: qty 30, 30d supply, fill #5
  Filled 2021-11-12: qty 30, 30d supply, fill #6
  Filled 2021-12-12: qty 30, 30d supply, fill #7
  Filled 2022-01-13: qty 30, 30d supply, fill #8
  Filled 2022-02-04: qty 30, 30d supply, fill #9
  Filled 2022-02-28: qty 30, 30d supply, fill #10
  Filled 2022-03-25: qty 30, 30d supply, fill #11

## 2021-04-19 MED ORDER — METRONIDAZOLE 500 MG PO TABS: 500.0000 mg | ORAL_TABLET | Freq: Two times a day (BID) | ORAL | Status: DC

## 2021-04-19 MED ORDER — AMOXICILLIN-POT CLAVULANATE 875-125 MG PO TABS
1.0000 | ORAL_TABLET | Freq: Two times a day (BID) | ORAL | Status: DC
  Administered 2021-04-19: 1 via ORAL
  Filled 2021-04-19: qty 1

## 2021-04-19 MED ORDER — DOXYCYCLINE HYCLATE 100 MG PO TABS
100.0000 mg | ORAL_TABLET | Freq: Two times a day (BID) | ORAL | 0 refills | Status: DC
  Filled 2021-04-19: qty 56, 28d supply, fill #0

## 2021-04-19 MED ORDER — AMOXICILLIN-POT CLAVULANATE 875-125 MG PO TABS
1.0000 | ORAL_TABLET | Freq: Two times a day (BID) | ORAL | 0 refills | Status: DC
  Filled 2021-04-19: qty 56, 28d supply, fill #0

## 2021-04-19 MED ORDER — ATOVAQUONE 750 MG/5ML PO SUSP
1500.0000 mg | Freq: Two times a day (BID) | ORAL | 11 refills | Status: DC
  Filled 2021-04-19: qty 120, 6d supply, fill #0

## 2021-04-19 MED ORDER — IBUPROFEN 800 MG PO TABS
800.0000 mg | ORAL_TABLET | Freq: Three times a day (TID) | ORAL | 3 refills | Status: DC | PRN
  Filled 2021-04-19: qty 60, 20d supply, fill #0

## 2021-04-19 NOTE — Discharge Instructions (Signed)
Pelvic Inflammatory Disease  Pelvic inflammatory disease (PID) is caused by an infection in some or all of the female reproductive organs. The infection can be in the uterus, ovaries, fallopian tubes, or the surrounding tissues in the pelvis. PID can cause abdominal or pelvic pain that comes on suddenly (acute pelvic pain). PID is a serious infection because it can lead to lasting (chronic) pelvic pain or the inability to have children (infertility). What are the causes? This condition is most often caused by bacteria that is spread during sexual contact. It can also be caused by a bacterial infection of the vagina (bacterial vaginosis) that is not spread by sexual contact. This condition occurs when the infection is not treated and the bacteria travel upward from the vagina or cervix into the reproductive organs. Bacteria may also be introduced into the reproductive organs following:  The birth of a baby.  A miscarriage.  An abortion.  Major pelvic surgery.  The insertion of an intrauterine device (IUD).  A sexual assault. What increases the risk? You are more likely to develop this condition if you:  Are younger than 32 years of age.  Are sexually active at a young age.  Have a history of STI (sexually transmitted infection) or PID.  Do not regularly use barrier contraception methods, such as condoms.  Have multiple sexual partners.  Have sex with someone who has symptoms of an STI.  Use a vaginal douche.  Have recently had an IUD inserted. What are the signs or symptoms? Symptoms of this condition include:  Abdominal or pelvic pain.  Fever.  Chills.  Abnormal vaginal discharge.  Abnormal uterine bleeding.  Unusual pain shortly after the end of a menstrual period.  Painful urination.  Pain with sex.  Nausea and vomiting. How is this diagnosed? This condition is diagnosed based on a pelvic exam and medical history. A pelvic exam can reveal signs of  infection, inflammation, and discharge in the vagina and the surrounding tissues. It can also help to identify painful areas. You may also have tests, such as:  Lab tests, including a pregnancy test, blood tests, and a urine test.  Culture tests of the vagina and cervix to check for an STI.  Ultrasound.  A laparoscopic procedure to look inside the pelvis.  Examination of vaginal discharge under a microscope. How is this treated? This condition may be treated with:  Antibiotic medicines taken by mouth (orally). For more severe cases, antibiotics may be given through an IV at the hospital.  Surgery. This is rare. Surgery may be needed if other treatments do not help.  Efforts to stop the spread of the infection. Sexual partners may need to be treated if the infection is caused by an STI. It may take weeks until you are completely well. If you are diagnosed with PID, you should also be checked for HIV (human immunodeficiency virus). Your health care provider may test you for infection again 3 months after treatment. You should not have unprotected sex. Follow these instructions at home:  Take over-the-counter and prescription medicines only as told by your health care provider.  If you were prescribed an antibiotic medicine, take it as told by your health care provider. Do not stop using the antibiotic even if you start to feel better.  Do not have sex until treatment is completed or as told by your health care provider. If PID is confirmed, your recent sexual partners will need treatment, especially if you had unprotected sex.  Keep all  follow-up visits as told by your health care provider. This is important. Contact a health care provider if:  You have increased or abnormal vaginal discharge.  Your pain does not improve.  You vomit.  You have a fever.  You cannot tolerate your medicines.  Your partner has an STI.  You have pain when you urinate. Get help right away  if:  You have increased abdominal or pelvic pain.  You have chills.  Your symptoms are not better in 72 hours with treatment. Summary  Pelvic inflammatory disease (PID) is caused by an infection in some or all of the female reproductive organs.  PID is a serious infection because it can lead to lasting (chronic) pelvic pain or the inability to have children (infertility).  This infection is usually treated with antibiotic medicines.  Do not have sex until treatment is completed or as told by your health care provider. This information is not intended to replace advice given to you by your health care provider. Make sure you discuss any questions you have with your health care provider. Document Revised: 08/19/2018 Document Reviewed: 08/24/2018 Elsevier Patient Education  2021 [employer].

## 2021-04-19 NOTE — Progress Notes (Signed)
Teaching complete  prescriptions meds brought  To pt  For home use

## 2021-04-19 NOTE — Progress Notes (Signed)
    Faculty Practice OB/GYN Attending Note  Subjective:  Patient reports mild abdominal pain, markedly improved from admission, more like her baseline now. No fevers for over 48 hours.  Last fever 100.7 5/4 0408.  Eating well, ambulating. Has been in communication with primary GYN team at [facility], follow up already arranged for her.   Admitted on 04/14/2021 for TOA (tubo-ovarian abscess).    Objective:  Blood pressure 118/81, pulse 74, temperature 97.8 F (36.6 C), temperature source Oral, resp. rate 16, last menstrual period 03/31/2021, SpO2 99 %.  Gen: NAD HENT: Normocephalic, atraumatic Lungs: Normal respiratory effort Heart: Regular rate noted Abdomen: Mild diffuse TTP, L>R Ext: 2+ DTRs, no edema, no cyanosis, negative Homan's sign  Labs: CBC Latest Ref Rng & Units 04/17/2021 04/15/2021 04/14/2021  WBC 4.0 - 10.5 K/uL 6.0 12.6(H) 11.4(H)  Hemoglobin 12.0 - 15.0 g/dL 10.5(L) 12.2 12.1  Hematocrit 36.0 - 46.0 % 30.7(L) 37.0 37.1  Platelets 150 - 400 K/uL 172 175 173  Cultures NGTD  Assessment & Plan:  30 y.o. G1P0010 admitted for bilateral TOA - Switched to oral Metronidazole and Doxycycline - Continue Ibuprofen,  Acetaminophen and Oxycodone as needed for pain - Continue Biktarvy for HIV infection  and Mepron for history of CNS Toxo. Dr. Baxter Flattery (ID) consulted, the team will leave recommendations about duration of this therapy. - Continue close observation and current care - Discharge to home later today after ID consult as long as she remains stable   Verita Schneiders, MD, [place], [facility] for [employer], [place]

## 2021-04-19 NOTE — Plan of Care (Signed)
  Problem: Health Behavior/Discharge Planning: Goal: Ability to manage health-related needs will improve Outcome: Completed/Met

## 2021-04-19 NOTE — Consult Note (Signed)
[facility] for Infectious Disease    Date of Admission:  04/14/2021     Total days of antibiotics 6               Reason for Consult: HIV / Toxoplasmosis / TOA   Referring Provider: Dr. Harolyn Rutherford Primary Care Provider: Gerlene Fee, DO   ASSESSMENT:  [name] is a 30 y/o female with previous history of less than optimal adherence to her HIV regimen complicated by toxoplasmosis infection admitted with tubo-ovarian abscess. Current TOA treatment with doxycycline and metronidazole and will change to Augmentin and doxycycline which she will continue until seen by [facility] as scheduled. Reports good compliance with her Biktarvy since last office visit and has not been taking Mepron (last pharmacy refill of [facility] was in February). Last viral load improved but not undetectable at 648 with CD4 count of 136. Discussed importance of taking her Biktarvy and Mepron as prescribed. Refills of medication have been provided. She has a follow up visit with Dr. Baxter Flattery on 5/18. [place] for discharge from ID standpoint.    PLAN:  1. Continue Biktarvy and re-start Mepron.  2. Change antibiotics to Augmentin and doxycycline for TOC until seen by [facility]. 3. Refills of Biktarvy and Mepron. 4. Follow up with Dr. Baxter Flattery on 5/18.  [place] for discharge from ID standpoint.      Principal Problem:   TOA (tubo-ovarian abscess) Active Problems:   Toxoplasmosis   AIDS (acquired immune deficiency syndrome) ([place])   . amoxicillin-clavulanate  1 tablet Oral Q12H  . atovaquone  1,500 mg Oral BID WC  . bictegravir-emtricitabine-tenofovir AF  1 tablet Oral Daily  . doxycycline  100 mg Oral Q12H  . enoxaparin (LOVENOX) injection  40 mg Subcutaneous QHS  . gabapentin  300 mg Oral BID  . polyethylene glycol  17 g Oral Daily     HPI: Kelsey Rodriguez is a 30 y.o. female with AIDS secondary to poorly controlled HIV disease and complicated by toxoplasmosis infection admitted with  worsening abdominal pain.   CT abdomen/pelvis with bilateral ovarian cysts with left sided tubo-ovarian abscess. Started on Cefoxitin, doxycycline and metronidazole. Febrile on second day of admission with temperature of 101.8. Blood cultures drawn have been without growth to date. Afebrile over the past 48 hours. Plans for follow up with her primary GYN team at [facility].  [name] was last seen in the ID office through telehealth on 02/27/21 with Dr. Baxter Flattery and noted to have a lapse with her adherence to [facility] with CD4 count of 214 and viral load of 648. She had not been taking her Mepron secondary because she ran out of medication and was unaware of refills which were available. Per her report she was missing 6-7 doses of medication per month and now taking it daily since that appointment. Of note, per pharmacy refills she has not had a prescription refill since February. Currently with abdominal pain and denies headaches or changes in vision.    Review of Systems: Review of Systems  Constitutional: Negative for chills, fever and weight loss.  Respiratory: Negative for cough, shortness of breath and wheezing.   Cardiovascular: Negative for chest pain and leg swelling.  Gastrointestinal: Positive for abdominal pain. Negative for constipation, diarrhea, nausea and vomiting.  Skin: Negative for rash.     Past Medical History:  Diagnosis Date  . 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 Adrenal insufficiency ([place])   . Anemia of chronic disease 03/11/2012  .  Back pain of lumbar region with sciatica 02/12/2015  . Bell's palsy 08/26/2013  . Brain lesion   . 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 Chronic back pain   . Chronic leg pain    bilateral knees, ankles  . Depression   . Fatigue   . GERD (gastroesophageal reflux disease)   . Headache   . 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 HIV (human immunodeficiency virus infection) ([place]) 02/2012  . Laceration of ankle, right 11/18/2012  . Lumbar radiculopathy    . Meningitis 02/18/2018  . Pelvic pain   . PID (acute pelvic inflammatory disease) 02/26/2018  . Pneumonia   . Reflux esophagitis 03/11/2012  . Seizure ([place])   . Tuberculosis   . 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 Vertigo   . Wears glasses     Social History   Tobacco Use  . Smoking status: Never Smoker  . Smokeless tobacco: Never Used  Vaping Use  . Vaping Use: Never used  Substance Use Topics  . Alcohol use: Not Currently    Alcohol/week: 0.0 standard drinks    Comment: socially  . Drug use: No    Family History  Problem Relation Age of Onset  . Heart disease Father        Vague not clearly cardiac  . Hypertension Mother     Allergies  Allergen Reactions  . Hydrocodone Itching and Nausea Only    Tolerates Oxycodone  . Tramadol Itching and Nausea Only    Tolerates oxycodone    OBJECTIVE: Blood pressure (!) 139/91, pulse 91, temperature 98.6 F (37 C), temperature source Oral, resp. rate 16, last menstrual period 03/31/2021, SpO2 99 %.  Physical Exam Constitutional:      General: She is not in acute distress.    Appearance: She is well-developed.  Eyes:     Conjunctiva/sclera: Conjunctivae normal.  Cardiovascular:     Rate and Rhythm: Normal rate and regular rhythm.     Heart sounds: Normal heart sounds. No murmur heard. No friction rub. No gallop.   Pulmonary:     Effort: Pulmonary effort is normal. No respiratory distress.     Breath sounds: Normal breath sounds. No wheezing or rales.  Chest:     Chest wall: No tenderness.  Abdominal:     General: Bowel sounds are normal.     Palpations: Abdomen is soft.     Tenderness: There is no abdominal tenderness.  Musculoskeletal:     Cervical back: Neck supple.  Lymphadenopathy:     Cervical: No cervical adenopathy.  Skin:    General: Skin is warm and dry.     Findings: No rash.  Neurological:     Mental Status: She is alert.  Psychiatric:        Thought Content: Thought content  normal.        Judgment: Judgment normal.     Lab Results Lab Results  Component Value Date   WBC 6.0 04/17/2021   HGB 10.5 (L) 04/17/2021   HCT 30.7 (L) 04/17/2021   MCV 91.9 04/17/2021   PLT 172 04/17/2021    Lab Results  Component Value Date   CREATININE 0.73 04/14/2021   BUN 12 04/14/2021   NA 134 (L) 04/14/2021   K 3.6 04/14/2021   CL 104 04/14/2021   CO2 20 (L) 04/14/2021    Lab Results  Component Value Date   ALT 19 04/14/2021   AST 24 04/14/2021   ALKPHOS 81 04/14/2021   BILITOT 0.5 04/14/2021  Microbiology: Recent Results (from the past 240 hour(s))  Blood culture (routine x 2)     Status: None   Collection Time: 04/14/21 10:05 PM   Specimen: BLOOD  Result Value Ref Range Status   Specimen Description BLOOD RIGHT ANTECUBITAL  Final   Special Requests   Final    BOTTLES DRAWN AEROBIC AND ANAEROBIC Blood Culture adequate volume   Culture   Final    NO GROWTH 5 DAYS Performed at [facility], [address], [place], [place] [address]    Report Status 04/19/2021 FINAL  Final  Blood culture (routine x 2)     Status: None   Collection Time: 04/14/21 10:37 PM   Specimen: BLOOD LEFT HAND  Result Value Ref Range Status   Specimen Description BLOOD LEFT HAND  Final   Special Requests   Final    BOTTLES DRAWN AEROBIC AND ANAEROBIC Blood Culture adequate volume   Culture   Final    NO GROWTH 5 DAYS Performed at [facility], [place] [address] [place], [place] [address]    Report Status 04/19/2021 FINAL  Final  SARS CORONAVIRUS 2 (TAT 6-24 HRS) Nasopharyngeal Nasopharyngeal Swab     Status: None   Collection Time: 04/14/21 11:47 PM   Specimen: Nasopharyngeal Swab  Result Value Ref Range Status   SARS Coronavirus 2 NEGATIVE NEGATIVE Final    Comment: (NOTE) SARS-CoV-2 target nucleic acids are NOT DETECTED.  The SARS-CoV-2 RNA is generally detectable in upper and lower respiratory specimens during the acute phase of infection.  Negative results do not preclude SARS-CoV-2 infection, do not rule out co-infections with other pathogens, and should not be used as the sole basis for treatment or other patient management decisions. Negative results must be combined with clinical observations, patient history, and epidemiological information. The expected result is Negative.  Fact Sheet for Patients: SugarRoll.be  Fact Sheet for Healthcare Providers: https://www.woods-mathews.com/  This test is not yet approved or cleared by the [place] FDA and  has been authorized for detection and/or diagnosis of SARS-CoV-2 by FDA under an Emergency Use Authorization (EUA). This EUA will remain  in effect (meaning this test can be used) for the duration of the COVID-19 declaration under Se ction 564(b)(1) of the Act, 21 U.S.C. section 360bbb-3(b)(1), unless the authorization is terminated or revoked sooner.  Performed at [facility], [place] [address], [place], [place] [address]   Wet prep, genital     Status: Abnormal   Collection Time: 04/15/21 12:43 AM  Result Value Ref Range Status   Yeast Wet Prep HPF POC NONE SEEN NONE SEEN Final   Trich, Wet Prep NONE SEEN NONE SEEN Final   Clue Cells Wet Prep HPF POC NONE SEEN NONE SEEN Final   WBC, Wet Prep HPF POC MANY (A) NONE SEEN Final   Sperm NONE SEEN  Final    Comment: Performed at [place] [facility], [place] [address] [place], [place] [address]  Culture, Urine     Status: Abnormal   Collection Time: 04/15/21  7:56 PM   Specimen: Urine, Random  Result Value Ref Range Status   Specimen Description URINE, RANDOM  Final   Special Requests NONE  Final   Culture (A)  Final    50,000 COLONIES/mL LACTOBACILLUS SPECIES Standardized susceptibility testing for this organism is not available. Performed at [facility], [place] [address], [place], [place] [address]    Report Status 04/17/2021 FINAL  Final  Culture,  blood (routine  x 2)     Status: None (Preliminary result)   Collection Time: 04/15/21  8:26 PM   Specimen: BLOOD  Result Value Ref Range Status   Specimen Description BLOOD RIGHT ANTECUBITAL  Final   Special Requests   Final    BOTTLES DRAWN AEROBIC AND ANAEROBIC Blood Culture adequate volume   Culture   Final    NO GROWTH 4 DAYS Performed at [facility], [address] [place], [place] [address]    Report Status PENDING  Incomplete  Culture, blood (routine x 2)     Status: None (Preliminary result)   Collection Time: 04/15/21  8:26 PM   Specimen: BLOOD  Result Value Ref Range Status   Specimen Description BLOOD RIGHT ANTECUBITAL  Final   Special Requests   Final    BOTTLES DRAWN AEROBIC AND ANAEROBIC Blood Culture adequate volume   Culture   Final    NO GROWTH 4 DAYS Performed at [facility], [place] [address] [place],  [address]    Report Status PENDING  Incomplete     Terri Piedra, [place] [place] [facility]  04/19/2021  1:21 PM

## 2021-04-19 NOTE — Discharge Summary (Signed)
Physician Discharge Summary  Patient ID: Kelsey Rodriguez MRN: [id number] DOB/AGE: [birth year] 30 y.o.  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 Her fever curve improved and her pain decreased significantly.  By time of discharge, she was transitioned to oral antibiotics and had been over 48 hours afebrile.  She has been followed closely by her primary GYN at [facility], and already made arrangements to see them soon.  Of note, she was seen by ID during her stay, was restarted on her Atovaquone for her history of CNS toxoplasmosis.  She was deemed stable for discharge to home on 04/19/2021.  Consults: ID  Significant Diagnostic Studies:  CBC Latest Ref Rng & Units 04/17/2021 04/15/2021 04/14/2021  WBC 4.0 - 10.5 K/uL 6.0 12.6(H) 11.4(H)  Hemoglobin 12.0 - 15.0 g/dL 10.5(L) 12.2 12.1  Hematocrit 36.0 - 46.0 % 30.7(L) 37.0 37.1  Platelets 150 - 400 K/uL 172 175 173   BMP Latest Ref Rng & Units 04/14/2021 03/28/2021 08/30/2020  Glucose 70 - 99 mg/dL 95 84 87  BUN 6 - 20 mg/dL 12 13 12   Creatinine 0.44 - 1.00 mg/dL 0.73 0.87 0.89  BUN/Creat Ratio 6 - 22 (calc) - NOT APPLICABLE -  Sodium 630 - 145 mmol/L 134(L) 136 136  Potassium 3.5 - 5.1 mmol/L 3.6 4.2 4.1  Chloride 98 - 111 mmol/L 104 103 104  CO2 22 - 32 mmol/L 20(L) 24 23  Calcium 8.9 - 10.3 mg/dL 9.3 9.7 9.5   Recent Results (from the past 240 hour(s))  Blood culture (routine x 2)     Status: None   Collection Time: 04/14/21 10:05 PM   Specimen: BLOOD  Result Value Ref Range Status   Specimen Description BLOOD RIGHT ANTECUBITAL  Final   Special Requests   Final    BOTTLES DRAWN AEROBIC AND ANAEROBIC Blood  Culture adequate volume   Culture   Final    NO GROWTH 5 DAYS Performed at [facility], [address] [place], [place] [address]    Report Status 04/19/2021 FINAL  Final  Blood culture (routine x 2)     Status: None   Collection Time: 04/14/21 10:37 PM   Specimen: BLOOD LEFT HAND  Result Value Ref Range Status   Specimen Description BLOOD LEFT HAND  Final   Special Requests   Final    BOTTLES DRAWN AEROBIC AND ANAEROBIC Blood Culture adequate volume   Culture   Final    NO GROWTH 5 DAYS Performed at [facility], [place] [address] [place], [place] [address]    Report Status 04/19/2021 FINAL  Final  SARS CORONAVIRUS 2 (TAT 6-24 HRS) Nasopharyngeal Nasopharyngeal Swab     Status: None   Collection Time: 04/14/21 11:47 PM   Specimen: Nasopharyngeal Swab  Result Value Ref Range Status   SARS Coronavirus 2 NEGATIVE NEGATIVE Final    Comment: (NOTE) SARS-CoV-2 target nucleic acids are NOT DETECTED.  The SARS-CoV-2 RNA is generally detectable in upper and lower respiratory specimens during the acute phase of infection. Negative results do not preclude SARS-CoV-2 infection, do not rule out co-infections with other pathogens, and should not be used as the sole basis for treatment or other patient management decisions. Negative results  must be combined with clinical observations, patient history, and epidemiological information. The expected result is Negative.  Fact Sheet for Patients: SugarRoll.be  Fact Sheet for Healthcare Providers: https://www.woods-mathews.com/  This test is not yet approved or cleared by the [place] FDA and  has been authorized for detection and/or diagnosis of SARS-CoV-2 by FDA under an Emergency Use Authorization (EUA). This EUA will remain  in effect (meaning this test can be used) for the duration of the COVID-19 declaration under Se ction 564(b)(1) of the Act, 21 U.S.C. section 360bbb-3(b)(1),  unless the authorization is terminated or revoked sooner.  Performed at [facility], [place] [address], [place], [place] [address]   Wet prep, genital     Status: Abnormal   Collection Time: 04/15/21 12:43 AM  Result Value Ref Range Status   Yeast Wet Prep HPF POC NONE SEEN NONE SEEN Final   Trich, Wet Prep NONE SEEN NONE SEEN Final   Clue Cells Wet Prep HPF POC NONE SEEN NONE SEEN Final   WBC, Wet Prep HPF POC MANY (A) NONE SEEN Final   Sperm NONE SEEN  Final    Comment: Performed at [facility], [place] [address], [place], [place] [address]  Culture, Urine     Status: Abnormal   Collection Time: 04/15/21  7:56 PM   Specimen: Urine, Random  Result Value Ref Range Status   Specimen Description URINE, RANDOM  Final   Special Requests NONE  Final   Culture (A)  Final    50,000 COLONIES/mL LACTOBACILLUS SPECIES Standardized susceptibility testing for this organism is not available. Performed at [facility], [place] [address], [place], [place] [address]    Report Status 04/17/2021 FINAL  Final  Culture, blood (routine x 2)     Status: None   Collection Time: 04/15/21  8:26 PM   Specimen: BLOOD  Result Value Ref Range Status   Specimen Description BLOOD RIGHT ANTECUBITAL  Final   Special Requests   Final    BOTTLES DRAWN AEROBIC AND ANAEROBIC Blood Culture adequate volume   Culture   Final    NO GROWTH 5 DAYS Performed at [facility], [place] [address] [place], [place] [address]    Report Status 04/20/2021 FINAL  Final  Culture, blood (routine x 2)     Status: None   Collection Time: 04/15/21  8:26 PM   Specimen: BLOOD  Result Value Ref Range Status   Specimen Description BLOOD RIGHT ANTECUBITAL  Final   Special Requests   Final    BOTTLES DRAWN AEROBIC AND ANAEROBIC Blood Culture adequate volume   Culture   Final    NO GROWTH 5 DAYS Performed at [facility], [place] [address], [place], [place] [address]    Report Status 04/20/2021 FINAL  Final   CT  ABDOMEN PELVIS W CONTRAST  Result Date: 04/14/2021 CLINICAL DATA:  Lower abdominal pain. EXAM: CT ABDOMEN AND PELVIS WITH CONTRAST TECHNIQUE: Multidetector CT imaging of the abdomen and pelvis was performed using the standard protocol following bolus administration of intravenous contrast. CONTRAST:  130mL OMNIPAQUE IOHEXOL 300 MG/ML  SOLN COMPARISON:  None. FINDINGS: Lower chest: No acute abnormality. Hepatobiliary: No focal liver abnormality is seen. No gallstones or biliary dilatation. Approximately 4.3 mm focal thickening of the gallbladder wall is noted in the region of the fundus. Pancreas: Unremarkable. No pancreatic ductal dilatation or surrounding inflammatory changes. Spleen: Normal in size without focal abnormality. Adrenals/Urinary Tract: Adrenal glands are unremarkable.  Kidneys are normal, without renal calculi or hydronephrosis. A 5 mm cystic appearing area is seen within the anterior aspect of the mid left kidney. The urinary bladder is normal in appearance and is limited in evaluation secondary to overlying streak artifact. Stomach/Bowel: Stomach is within normal limits. Appendix appears normal. No evidence of bowel wall thickening, distention, or inflammatory changes. Vascular/Lymphatic: No significant vascular findings are present. No enlarged abdominal or pelvic lymph nodes. Reproductive: Uterus is normal in size and appearance. A 5.1 cm x 2.4 cm cystic appearing area is seen within the right adnexa. Similar appearing 3.1 cm x 2.3 cm and 2.4 cm x 1.5 cm cystic appearing areas are noted within the left adnexa. A 2.3 cm diameter tubular appearing fluid-filled structure is seen along the medial aspect of the left adnexa (axial CT images 49 through 71. A mild amount of para adnexal fluid and inflammatory fat stranding is seen on the left. Other: No abdominal wall hernia or abnormality. No abdominopelvic ascites. Musculoskeletal: Bilateral total hip replacements are seen with associated streak  artifact and subsequently limited evaluation of the adjacent osseous and soft tissue structures. No acute osseous abnormalities are identified. IMPRESSION: 1. Bilateral ovarian cysts with additional findings consistent with a left-sided tubo-ovarian abscess. 2. Bilateral total hip replacements. Electronically Signed   By: Virgina Norfolk M.D.   On: 04/14/2021 21:07    Treatments: IV hydration, antibiotics: Cefoxitin, Doxycycline, Metronidazole > PO Augmentin and analgesia: Toradol>Ibuprofen, oxycodone and acetaminophen as needed.  Discharge Exam: Blood pressure (!) 139/91, pulse 91, temperature 98.6 F (37 C), temperature source Oral, resp. rate 16, last menstrual period 03/31/2021, SpO2 99 %. Gen: NAD HENT: Normocephalic, atraumatic Lungs: Normal respiratory effort Heart: Regular rate noted Abdomen: Mild diffuse TTP, L>R Ext: 2+ DTRs, no edema, no cyanosis, negative Homan's sign  Discharge disposition: 01-Home or Self Care   Allergies as of 04/19/2021      Reactions   Hydrocodone Itching, Nausea Only   Tolerates Oxycodone   Tramadol Itching, Nausea Only   Tolerates oxycodone      Medication List    STOP taking these medications   clotrimazole-betamethasone cream Commonly known as: LOTRISONE   penicillin v potassium 500 MG tablet Commonly known as: VEETID   sulfamethoxazole-trimethoprim 800-160 MG tablet Commonly known as: BACTRIM DS     TAKE these medications   amoxicillin-clavulanate 875-125 MG tablet Commonly known as: Augmentin Take 1 tablet by mouth 2 (two) times daily for 28 days.   atovaquone 750 MG/5ML suspension Commonly known as: MEPRON Take 10 mLs (1,500 mg total) by mouth 2 (two) times daily with a meal. What changed:   how much to take  how to take this  when to take this   Biktarvy 50-200-25 MG Tabs tablet Generic drug: bictegravir-emtricitabine-tenofovir AF TAKE 1 TABLET BY MOUTH DAILY.   cyclobenzaprine 5 MG tablet Commonly known as:  FLEXERIL Take 5 mg by mouth at bedtime as needed for muscle spasms.   doxycycline 100 MG tablet Commonly known as: VIBRA-TABS Take 1 tablet (100 mg total) by mouth 2 (two) times daily for 28 days.   DULoxetine 20 MG capsule Commonly known as: CYMBALTA Take 40 mg by mouth daily.   fluconazole 150 MG tablet Commonly known as: DIFLUCAN TAKE 1 TABLET BY MOUTH WEEKLY AS NEEDED   ibuprofen 800 MG tablet Commonly known as: ADVIL Take 1 tablet (800 mg total) by mouth 3 (three) times daily as needed for headache or moderate pain.  Signed: Verita Schneiders, MD 04/21/2021, 3:25 PM

## 2021-04-20 LAB — CULTURE, BLOOD (ROUTINE X 2)
Culture: NO GROWTH
Culture: NO GROWTH
Special Requests: ADEQUATE
Special Requests: ADEQUATE

## 2021-04-23 DIAGNOSIS — Z20822 Contact with and (suspected) exposure to covid-19: Secondary | ICD-10-CM | POA: Diagnosis not present

## 2021-04-24 ENCOUNTER — Encounter: Payer: Self-pay | Admitting: Internal Medicine

## 2021-04-24 ENCOUNTER — Ambulatory Visit (INDEPENDENT_AMBULATORY_CARE_PROVIDER_SITE_OTHER): Payer: Medicare Other | Admitting: Internal Medicine

## 2021-04-24 ENCOUNTER — Other Ambulatory Visit: Payer: Self-pay

## 2021-04-24 VITALS — BP 109/74 | HR 84 | Temp 98.4°F | Wt 175.0 lb

## 2021-04-24 DIAGNOSIS — B20 Human immunodeficiency virus [HIV] disease: Secondary | ICD-10-CM

## 2021-04-24 DIAGNOSIS — Z9119 Patient's noncompliance with other medical treatment and regimen: Secondary | ICD-10-CM

## 2021-04-24 DIAGNOSIS — B582 Toxoplasma meningoencephalitis: Secondary | ICD-10-CM

## 2021-04-24 DIAGNOSIS — N7093 Salpingitis and oophoritis, unspecified: Secondary | ICD-10-CM

## 2021-04-24 DIAGNOSIS — Z91199 Patient's noncompliance with other medical treatment and regimen due to unspecified reason: Secondary | ICD-10-CM

## 2021-04-24 NOTE — Telephone Encounter (Signed)
Spoke with patient, she is agreeable to come in for an appointment with Dr. Baxter Flattery at 3:45 today.   Beryle Flock, RN

## 2021-04-24 NOTE — Progress Notes (Signed)
Patient ID: Kelsey Rodriguez, female   DOB: 1991/04/29, 30 y.o.   MRN: [id number]  HPI Recently hospitalized for L-TOA and discharged on 5/6 with amox/clav plus doxy. Now having worsening abdominal pain  Also on mepron for cns toxo and biktarvy for hiv.  Having left-side abdominal pain, more persistent. Has been taking the abtx.   Having urinary urgency,having discharge but now foul smelling    Outpatient Encounter Medications as of 04/24/2021  Medication Sig  . amoxicillin-clavulanate (AUGMENTIN) 875-125 MG tablet Take 1 tablet by mouth 2 (two) times daily for 28 days.  [name] atovaquone (MEPRON) 750 MG/5ML suspension Take 10 mLs (1,500 mg total) by mouth 2 (two) times daily with a meal.  . bictegravir-emtricitabine-tenofovir AF (BIKTARVY) 50-200-25 MG TABS tablet TAKE 1 TABLET BY MOUTH DAILY.  [name] doxycycline (VIBRA-TABS) 100 MG tablet Take 1 tablet (100 mg total) by mouth 2 (two) times daily for 28 days.  . DULoxetine (CYMBALTA) 20 MG capsule Take 40 mg by mouth daily.  [name] ibuprofen (ADVIL) 800 MG tablet Take 1 tablet (800 mg total) by mouth 3 (three) times daily as needed for headache or moderate pain.  [name] oxyCODONE (ROXICODONE) 5 MG immediate release tablet Take 5 mg by mouth every 4 (four) hours as needed for severe pain.  . cyclobenzaprine (FLEXERIL) 5 MG tablet Take 5 mg by mouth at bedtime as needed for muscle spasms. (Patient not taking: Reported on 04/24/2021)  . fluconazole (DIFLUCAN) 150 MG tablet TAKE 1 TABLET BY MOUTH WEEKLY AS NEEDED (Patient not taking: No sig reported)  . [DISCONTINUED] SUMAtriptan (IMITREX) 50 MG tablet Take 1 tablet (50 mg total) by mouth every 2 (two) hours as needed for migraine (Maximum dose: 100 mg per dose; 200 mg per 24 hours). May repeat in 2 hours if headache persists or recurs.   No facility-administered encounter medications on file as of 04/24/2021.     Patient Active Problem List   Diagnosis Date Noted  . TOA (tubo-ovarian abscess)  04/15/2021  . Cervical dysplasia, mild 12/06/2020  . Visit for routine gyn exam 11/01/2020  . STD exposure 11/01/2020  . S/P craniotomy 05/16/2020  . Brain tumor ([place]) 05/16/2020  . 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 Hypertension   . Pain of upper abdomen   . Suicide ideation   . 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 Seizure ([place])   . Intracranial mass   . Intractable headache 02/04/2020  . Encephalitis, myelitis, 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[place]) 01/31/2020  . Rotator cuff strain 01/26/2020  . 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 AIDS (acquired immune deficiency syndrome) ([place]) 11/07/2019  . Cerebral edema ([place]) 10/28/2019  . Midline shift of brain   . Pruritus 08/29/2019  . Tendinopathy of left shoulder 01/18/2019  . Chronic pelvic pain in female 01/04/2019  . Lower abdominal pain 06/21/2018  . Syphilis 02/26/2018  . Tuberculosis   . Infertility, female 02/12/2018  . 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 Acute right-sided low back pain with right-sided sciatica 08/24/2017  . Complex regional pain syndrome 02/03/2017  . Headache 10/28/2016  . Avascular necrosis of bone of right hip ([place]) 04/04/2016  . 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 Avascular necrosis of bone of left hip ([place]) 12/14/2015  . 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 Nausea 05/18/2015  . Vertigo 01/23/2015  . Primary adrenal insufficiency ([employer]) 01/03/2015  . Chest pain  07/07/2013  . HIV (human immunodeficiency virus infection) ([place]) 03/16/2012  . 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 COVID-19 Vaccine (3 - Pfizer risk 4-dose series) 06/27/2020     Review of Systems 12 point ros is negative except what is mentioned above Physical Exam   BP 109/74   Pulse 84   Temp 98.4 F (36.9 C) (Oral)   Wt 175 lb (79.4 kg)   LMP  03/31/2021   SpO2 100%   BMI 29.12 kg/m   Physical Exam  Constitutional:  oriented to person, place, and time. appears well-developed and well-nourished. No distress.  HENT: [place]/AT, PERRLA, no scleral icterus Mouth/Throat: Oropharynx is clear and moist. No oropharyngeal exudate.  Cardiovascular: Normal rate, regular rhythm and normal heart sounds. Exam reveals no gallop and no friction rub.  No murmur heard.  Pulmonary/Chest: Effort normal and breath sounds normal. No respiratory distress.  has no wheezes.  Neck = supple, no nuchal rigidity Abdominal: Soft. Bowel sounds are normal.  exhibits no distension. mild tenderness.  Lymphadenopathy: no cervical adenopathy. No axillary adenopathy Neurological: alert and oriented to person, place, and time.  Skin: Skin is warm and dry. No rash noted. No erythema.  Psychiatric: a normal mood and affect.  behavior is normal.   Lab Results  Component Value Date   CD4TCELL 8 (L) 03/28/2021   Lab Results  Component Value Date   CD4TABS 136 (L) 03/28/2021   CD4TABS 214 (L) 11/20/2020   CD4TABS 190 (L) 10/10/2020   Lab Results  Component Value Date   HIV1RNAQUANT 648 (H) 03/28/2021   Lab Results  Component Value Date   HEPBSAB POS (A) 09/13/2015   Lab Results  Component Value Date   LABRPR Reactive (A) 04/15/2021    CBC Lab Results  Component Value Date   WBC 6.0 04/17/2021   RBC 3.34 (L) 04/17/2021   HGB 10.5 (L) 04/17/2021   HCT 30.7 (L) 04/17/2021   PLT 172 04/17/2021   MCV 91.9 04/17/2021   MCH 31.4 04/17/2021   MCHC 34.2 04/17/2021   RDW 14.4 04/17/2021   LYMPHSABS 1.7 04/17/2021   MONOABS 0.7 04/17/2021   EOSABS 0.2 04/17/2021    BMET Lab Results  Component Value Date   NA 134 (L) 04/14/2021   K 3.6 04/14/2021   CL 104 04/14/2021   CO2 20 (L) 04/14/2021   GLUCOSE 95 04/14/2021   BUN 12 04/14/2021   CREATININE 0.73 04/14/2021   CALCIUM 9.3 04/14/2021   GFRNONAA >60 04/14/2021   GFRAA >60 08/30/2020       Assessment and Plan    Left TOA =concern that she needs to get surgical drainage, will refer her to going back to the ED. Will need repeat abd/ct and have gyn service evaluate for surgical intervention. Spoke with ED to expect patient to come in as walk in.  Cns toxo = continue on mepron  hiv disease = continue on biktarvy  Hx of syphilis = treated. No need to retreat

## 2021-04-25 ENCOUNTER — Encounter (HOSPITAL_COMMUNITY): Payer: Self-pay

## 2021-04-25 ENCOUNTER — Inpatient Hospital Stay (HOSPITAL_COMMUNITY)
Admission: EM | Admit: 2021-04-25 | Discharge: 2021-04-28 | DRG: 758 | Disposition: A | Discharge status: Home / Self Care | Payer: Medicare Other | Attending: Obstetrics & Gynecology | Admitting: Obstetrics & Gynecology

## 2021-04-25 ENCOUNTER — Other Ambulatory Visit: Payer: Self-pay

## 2021-04-25 ENCOUNTER — Emergency Department (HOSPITAL_COMMUNITY): Payer: Medicare Other

## 2021-04-25 DIAGNOSIS — Z21 Asymptomatic human immunodeficiency virus [HIV] infection status: Secondary | ICD-10-CM | POA: Diagnosis present

## 2021-04-25 DIAGNOSIS — N7093 Salpingitis and oophoritis, unspecified: Secondary | ICD-10-CM

## 2021-04-25 DIAGNOSIS — Z8661 Personal history of infections of the central nervous system: Secondary | ICD-10-CM

## 2021-04-25 DIAGNOSIS — E2740 Unspecified adrenocortical insufficiency: Secondary | ICD-10-CM | POA: Diagnosis present

## 2021-04-25 DIAGNOSIS — Z8611 Personal history of tuberculosis: Secondary | ICD-10-CM

## 2021-04-25 DIAGNOSIS — N898 Other specified noninflammatory disorders of vagina: Secondary | ICD-10-CM | POA: Diagnosis present

## 2021-04-25 DIAGNOSIS — R1032 Left lower quadrant pain: Secondary | ICD-10-CM | POA: Diagnosis not present

## 2021-04-25 DIAGNOSIS — Z885 Allergy status to narcotic agent status: Secondary | ICD-10-CM | POA: Diagnosis not present

## 2021-04-25 DIAGNOSIS — B20 Human immunodeficiency virus [HIV] disease: Secondary | ICD-10-CM | POA: Diagnosis not present

## 2021-04-25 DIAGNOSIS — Z20822 Contact with and (suspected) exposure to covid-19: Secondary | ICD-10-CM | POA: Diagnosis present

## 2021-04-25 DIAGNOSIS — D638 Anemia in other chronic diseases classified elsewhere: Secondary | ICD-10-CM | POA: Diagnosis present

## 2021-04-25 DIAGNOSIS — G8929 Other chronic pain: Secondary | ICD-10-CM | POA: Diagnosis present

## 2021-04-25 DIAGNOSIS — Z79899 Other long term (current) drug therapy: Secondary | ICD-10-CM | POA: Diagnosis not present

## 2021-04-25 DIAGNOSIS — Z96643 Presence of artificial hip joint, bilateral: Secondary | ICD-10-CM | POA: Diagnosis present

## 2021-04-25 DIAGNOSIS — M5416 Radiculopathy, lumbar region: Secondary | ICD-10-CM | POA: Diagnosis present

## 2021-04-25 HISTORY — DX: Salpingitis and oophoritis, unspecified: N70.93

## 2021-04-25 LAB — CBC
HCT: 35.7 % — ABNORMAL LOW (ref 36.0–46.0)
Hemoglobin: 11.8 g/dL — ABNORMAL LOW (ref 12.0–15.0)
MCH: 31.1 pg (ref 26.0–34.0)
MCHC: 33.1 g/dL (ref 30.0–36.0)
MCV: 94.2 fL (ref 80.0–100.0)
Platelets: 357 10*3/uL (ref 150–400)
RBC: 3.79 MIL/uL — ABNORMAL LOW (ref 3.87–5.11)
RDW: 14.6 % (ref 11.5–15.5)
WBC: 5.8 10*3/uL (ref 4.0–10.5)
nRBC: 0 % (ref 0.0–0.2)

## 2021-04-25 LAB — COMPREHENSIVE METABOLIC PANEL
ALT: 30 U/L (ref 0–44)
AST: 23 U/L (ref 15–41)
Albumin: 3.7 g/dL (ref 3.5–5.0)
Alkaline Phosphatase: 112 U/L (ref 38–126)
Anion gap: 8 (ref 5–15)
BUN: 12 mg/dL (ref 6–20)
CO2: 23 mmol/L (ref 22–32)
Calcium: 9.6 mg/dL (ref 8.9–10.3)
Chloride: 105 mmol/L (ref 98–111)
Creatinine, Ser: 0.76 mg/dL (ref 0.44–1.00)
GFR, Estimated: >60 mL/min (ref >60)
Glucose, Bld: 90 mg/dL (ref 70–99)
Potassium: 3.9 mmol/L (ref 3.5–5.1)
Sodium: 136 mmol/L (ref 135–145)
Total Bilirubin: 0.7 mg/dL (ref 0.3–1.2)
Total Protein: 8.4 g/dL — ABNORMAL HIGH (ref 6.5–8.1)

## 2021-04-25 LAB — URINALYSIS, ROUTINE W REFLEX MICROSCOPIC
Bilirubin Urine: NEGATIVE
Glucose, UA: NEGATIVE mg/dL
Ketones, ur: NEGATIVE mg/dL
Nitrite: NEGATIVE
Protein, ur: 30 mg/dL — AB
Specific Gravity, Urine: 1.023 (ref 1.005–1.030)
WBC, UA: >50 WBC/hpf — ABNORMAL HIGH (ref 0–5)
pH: 5 (ref 5.0–8.0)

## 2021-04-25 LAB — I-STAT BETA HCG BLOOD, ED (MC, WL, AP ONLY): I-stat hCG, quantitative: <5 m[IU]/mL (ref <5)

## 2021-04-25 LAB — LIPASE, BLOOD: Lipase: 30 U/L (ref 11–51)

## 2021-04-25 LAB — SARS CORONAVIRUS 2 (TAT 6-24 HRS): SARS Coronavirus 2: NEGATIVE

## 2021-04-25 MED ORDER — PRENATAL MULTIVITAMIN CH
1.0000 | ORAL_TABLET | Freq: Every day | ORAL | Status: DC
  Administered 2021-04-26 – 2021-04-27 (×2): 1 via ORAL
  Filled 2021-04-25 (×4): qty 1

## 2021-04-25 MED ORDER — BICTEGRAVIR-EMTRICITAB-TENOFOV 50-200-25 MG PO TABS
1.0000 | ORAL_TABLET | Freq: Every day | ORAL | Status: DC
  Administered 2021-04-25 – 2021-04-28 (×4): 1 via ORAL
  Filled 2021-04-25 (×4): qty 1

## 2021-04-25 MED ORDER — ZOLPIDEM TARTRATE 5 MG PO TABS: 5.0000 mg | ORAL_TABLET | Freq: Every evening | ORAL | Status: DC | PRN

## 2021-04-25 MED ORDER — OXYCODONE-ACETAMINOPHEN 5-325 MG PO TABS
1.0000 | ORAL_TABLET | Freq: Four times a day (QID) | ORAL | Status: DC | PRN
Start: 2021-04-25 — End: 2021-04-28
  Administered 2021-04-25 – 2021-04-28 (×4): 1 via ORAL
  Filled 2021-04-25 (×4): qty 1

## 2021-04-25 MED ORDER — ONDANSETRON HCL 4 MG PO TABS: 4.0000 mg | ORAL_TABLET | Freq: Four times a day (QID) | ORAL | Status: DC | PRN

## 2021-04-25 MED ORDER — CLINDAMYCIN PHOSPHATE 900 MG/50ML IV SOLN
900.0000 mg | Freq: Three times a day (TID) | INTRAVENOUS | Status: DC
  Administered 2021-04-25 – 2021-04-28 (×9): 900 mg via INTRAVENOUS
  Filled 2021-04-25 (×12): qty 50

## 2021-04-25 MED ORDER — IBUPROFEN 400 MG PO TABS
600.0000 mg | ORAL_TABLET | Freq: Four times a day (QID) | ORAL | Status: DC | PRN
  Administered 2021-04-26 – 2021-04-27 (×2): 600 mg via ORAL
  Filled 2021-04-25 (×2): qty 1

## 2021-04-25 MED ORDER — LACTATED RINGERS IV SOLN: INTRAVENOUS | Status: DC

## 2021-04-25 MED ORDER — MORPHINE SULFATE (PF) 4 MG/ML IV SOLN
4.0000 mg | Freq: Once | INTRAVENOUS | Status: AC
  Administered 2021-04-25: 4 mg via INTRAVENOUS
  Filled 2021-04-25: qty 1

## 2021-04-25 MED ORDER — GENTAMICIN SULFATE 40 MG/ML IJ SOLN
7.0000 mg/kg | INTRAVENOUS | Status: DC
  Administered 2021-04-25 – 2021-04-28 (×4): 430 mg via INTRAVENOUS
  Filled 2021-04-25 (×4): qty 10.75

## 2021-04-25 MED ORDER — DULOXETINE HCL 20 MG PO CPEP
40.0000 mg | ORAL_CAPSULE | Freq: Every day | ORAL | Status: DC
  Administered 2021-04-25 – 2021-04-28 (×4): 40 mg via ORAL
  Filled 2021-04-25 (×4): qty 2

## 2021-04-25 MED ORDER — DOCUSATE SODIUM 100 MG PO CAPS
100.0000 mg | ORAL_CAPSULE | Freq: Two times a day (BID) | ORAL | Status: DC
  Administered 2021-04-25 – 2021-04-28 (×4): 100 mg via ORAL
  Filled 2021-04-25 (×7): qty 1

## 2021-04-25 MED ORDER — ATOVAQUONE 750 MG/5ML PO SUSP
1500.0000 mg | Freq: Two times a day (BID) | ORAL | Status: DC
  Administered 2021-04-25 – 2021-04-28 (×6): 1500 mg via ORAL
  Filled 2021-04-25 (×7): qty 10

## 2021-04-25 MED ORDER — ONDANSETRON HCL 4 MG/2ML IJ SOLN: 4.0000 mg | Freq: Four times a day (QID) | INTRAMUSCULAR | Status: DC | PRN

## 2021-04-25 MED ORDER — SODIUM CHLORIDE 0.9 % IV SOLN
2.0000 g | Freq: Four times a day (QID) | INTRAVENOUS | Status: DC
  Administered 2021-04-25 – 2021-04-28 (×13): 2 g via INTRAVENOUS
  Filled 2021-04-25 (×2): qty 2
  Filled 2021-04-25: qty 2000
  Filled 2021-04-25 (×3): qty 2
  Filled 2021-04-25: qty 2000
  Filled 2021-04-25 (×2): qty 2
  Filled 2021-04-25: qty 2000
  Filled 2021-04-25 (×2): qty 2
  Filled 2021-04-25: qty 2000
  Filled 2021-04-25: qty 2
  Filled 2021-04-25: qty 2000
  Filled 2021-04-25: qty 2

## 2021-04-25 NOTE — ED Notes (Signed)
Pt ambulated to bathroom without difficulty.

## 2021-04-25 NOTE — ED Notes (Signed)
Pt ambulated to bathroom 

## 2021-04-25 NOTE — Progress Notes (Signed)
Pharmacy Antibiotic Note  Kelsey Rodriguez is a 29 y.o. female admitted on 04/25/2021 with abdominal pain.  Pharmacy has been consulted for gentamicin dosing for concern TOA. UA with large dilated tubular structure concerning for pyosalpinx/tubo-ovarian abscess. Patient on clindamycin and ampicillin per MD. Patient recently hospitalized for [facility] and discharged on Augmentin/Doxycycline. Scr at baseline.   Plan: Start gentamicin 7mg /kg (IBW) q24h  Follow up 9 hr gentamicin level for [place] extended interval dosing nomogram Continue ampicillin and clindamycin per MD  Monitor renal function closely  Height: 5\' 7"  (170.2 cm) Weight: 79.4 kg (175 lb) IBW/kg (Calculated) : 61.6  Temp (24hrs), Avg:98.5 F (36.9 C), Min:98.4 F (36.9 C), Max:98.7 F (37.1 C)  Recent Labs  Lab 04/25/21 0644  WBC 5.8  CREATININE 0.76    Estimated Creatinine Clearance: 109.5 mL/min (by C-G formula based on SCr of 0.76 mg/dL).    Allergies  Allergen Reactions  . Hydrocodone Itching and Nausea Only    Tolerates Oxycodone  . Tramadol Itching and Nausea Only    Tolerates oxycodone    Antimicrobials this admission: Clindamycin 5/12 >>  Ampicillin 5/12 >>  Gentamicin 5/12 >>   Dose adjustments this admission: N/a  Microbiology results:    Thank you for allowing pharmacy to be a part of this patient's care.  Cristela Felt, PharmD Clinical Pharmacist  04/25/2021 9:54 AM

## 2021-04-25 NOTE — ED Notes (Signed)
Pt is in restroom at this time

## 2021-04-25 NOTE — H&P (Signed)
Kelsey Rodriguez is an 30 y.o. female with known left TOA. She was admitted to our service from 5/1-5/6 and treated with IV antibiotics. Pain resolved. She was discharged home on a 28 days course of Augmentin. And instructed to follow up with her OB/GYN at [facility].  She returned to ER today with reports that shortly after discharge LLQ pain returned. Has daily and can be 10/10. Denies any fever, chills, or N/V.   U/S in ER today essentially unchanged from prior admission.   Known HIV and followed by ID  Menstrual History: Menarche age: 56 Patient's last menstrual period was 03/31/2021.    Past Medical History:  Diagnosis Date  . 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 Adrenal insufficiency ([place])   . Anemia of chronic disease 03/11/2012  . Back pain of lumbar region with sciatica 02/12/2015  . Bell's palsy 08/26/2013  . Brain lesion   . 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 Chronic back pain   . Chronic leg pain    bilateral knees, ankles  . Depression   . Fatigue   . GERD (gastroesophageal reflux disease)   . Headache   . 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 HIV (human immunodeficiency virus infection) ([place]) 02/2012  . Laceration of ankle, right 11/18/2012  . Lumbar radiculopathy   . Meningitis 02/18/2018  . Pelvic pain   . PID (acute pelvic inflammatory disease) 02/26/2018  . Pneumonia   . Reflux esophagitis 03/11/2012  . Seizure ([place])   . Tuberculosis   . 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 Vertigo   . Wears glasses     Past Surgical History:  Procedure Laterality Date  . APPENDECTOMY  ~ 2000  . APPLICATION OF CRANIAL NAVIGATION N/A 05/16/2020   Procedure: APPLICATION OF CRANIAL NAVIGATION;  Surgeon: Newman Pies, MD;  Location: [facility];  Service: Neurosurgery;  Laterality: N/A;  . CRANIOTOMY Right 05/16/2020   Procedure: Craniotomy for Resection of Lesion;  Surgeon: Newman Pies, MD;  Location: [place];  Service: Neurosurgery;  Laterality: Right;  right  . DILATION  AND CURETTAGE OF UTERUS  2008  . ESOPHAGOGASTRODUODENOSCOPY  03/11/2012   Procedure: ESOPHAGOGASTRODUODENOSCOPY (EGD);  Surgeon: Lafayette Dragon, MD;  [facility] ENDOSCOPY;  Service: Endoscopy;  Laterality: N/A;  . ESOPHAGOGASTRODUODENOSCOPY N/A 03/07/2014   Procedure: ESOPHAGOGASTRODUODENOSCOPY (EGD);  Surgeon: Gatha Mayer, MD;  Location[facility] ENDOSCOPY;  Service: Endoscopy;  Laterality: N/A;  . LUNG BIOPSY  02/2012  . TOTAL HIP ARTHROPLASTY Left 12/14/2015   Procedure: LEFT TOTAL HIP ARTHROPLASTY ANTERIOR APPROACH;  Surgeon: Mcarthur Rossetti, MD;  Location: [facility];  Service: Orthopedics;  Laterality: Left;  . TOTAL HIP ARTHROPLASTY Right 04/04/2016   Procedure: RIGHT TOTAL HIP ARTHROPLASTY ANTERIOR APPROACH;  Surgeon: Mcarthur Rossetti, MD;  Location: [facility];  Service: Orthopedics;  Laterality: Right;    Family History  Problem Relation Age of Onset  . Heart disease Father        Vague not clearly cardiac  . Hypertension Mother     Social History:  reports that she has never smoked. She has never used smokeless tobacco. She reports previous alcohol use. She reports that she does not use drugs.  Allergies:  Allergies  Allergen Reactions  . Hydrocodone Itching and Nausea Only    Tolerates Oxycodone  . Tramadol Itching and Nausea Only    Tolerates oxycodone    Medications Prior to Admission  Medication Sig Dispense Refill Last Dose  . amoxicillin-clavulanate (AUGMENTIN) 875-125 MG tablet Take 1 tablet by  mouth 2 (two) times daily for 28 days. (Patient taking differently: Take 1 tablet by mouth See admin instructions. Bid x 28 days) 56 tablet 0 04/24/2021 at Unknown time  . atovaquone (MEPRON) 750 MG/5ML suspension Take 10 mLs (1,500 mg total) by mouth 2 (two) times daily with a meal. (Patient taking differently: Take 1,500 mg by mouth See admin instructions. Bid x 6 days) 600 mL 11 04/24/2021 at Unknown time  . bictegravir-emtricitabine-tenofovir AF (BIKTARVY) 50-200-25 MG TABS  tablet TAKE 1 TABLET BY MOUTH DAILY. 30 tablet 11 04/24/2021 at Unknown time  . doxycycline (VIBRA-TABS) 100 MG tablet Take 1 tablet (100 mg total) by mouth 2 (two) times daily for 28 days. (Patient taking differently: Take 100 mg by mouth See admin instructions. Bid x 28 days) 56 tablet 0 04/24/2021 at Unknown time  . DULoxetine (CYMBALTA) 20 MG capsule Take 40 mg by mouth daily.   04/24/2021 at Unknown time  . ibuprofen (ADVIL) 800 MG tablet Take 1 tablet (800 mg total) by mouth 3 (three) times daily as needed for headache or moderate pain. 60 tablet 3 Past Week at Unknown time  . oxyCODONE (OXY IR/ROXICODONE) 5 MG immediate release tablet Take 5 mg by mouth every 4 (four) hours as needed for severe pain.   Past Week at Unknown time  . cyclobenzaprine (FLEXERIL) 5 MG tablet Take 5 mg by mouth at bedtime as needed for muscle spasms. (Patient not taking: Reported on 04/24/2021)     . fluconazole (DIFLUCAN) 150 MG tablet TAKE 1 TABLET BY MOUTH WEEKLY AS NEEDED (Patient not taking: No sig reported) 2 tablet 0 Completed Course at Unknown time    Review of Systems  Gastrointestinal: Positive for abdominal pain.  Genitourinary: Positive for pelvic pain.    Blood pressure 117/84, pulse 74, temperature 98.2 F (36.8 C), temperature source Oral, resp. rate 18, height 5\' 7"  (1.702 m), weight 79.4 kg, last menstrual period 03/31/2021, SpO2 100 %. Physical Exam Cardiovascular:     Rate and Rhythm: Normal rate and regular rhythm.  Pulmonary:     Effort: Pulmonary effort is normal.     Breath sounds: Normal breath sounds.  Abdominal:     General: Bowel sounds are normal.     Palpations: Abdomen is soft.     Tenderness: There is abdominal tenderness in the left lower quadrant.  Genitourinary:    Comments: Deffered Neurological:     Mental Status: She is alert.     Results for orders placed or performed during the hospital encounter of 04/25/21 (from the past 24 hour(s))  Urinalysis, Routine w reflex  microscopic Urine, Clean Catch     Status: Abnormal   Collection Time: 04/25/21  6:38 AM  Result Value Ref Range   Color, Urine YELLOW YELLOW   APPearance CLOUDY (A) CLEAR   Specific Gravity, Urine 1.023 1.005 - 1.030   pH 5.0 5.0 - 8.0   Glucose, UA NEGATIVE NEGATIVE mg/dL   Hgb urine dipstick MODERATE (A) NEGATIVE   Bilirubin Urine NEGATIVE NEGATIVE   Ketones, ur NEGATIVE NEGATIVE mg/dL   Protein, ur 30 (A) NEGATIVE mg/dL   Nitrite NEGATIVE NEGATIVE   Leukocytes,Ua LARGE (A) NEGATIVE   RBC / HPF 21-50 0 - 5 RBC/hpf   WBC, UA >50 (H) 0 - 5 WBC/hpf   Bacteria, UA MANY (A) NONE SEEN   Squamous Epithelial / LPF 21-50 0 - 5   Mucus PRESENT   Lipase, blood     Status: None   Collection Time:  04/25/21  6:44 AM  Result Value Ref Range   Lipase 30 11 - 51 U/L  Comprehensive metabolic panel     Status: Abnormal   Collection Time: 04/25/21  6:44 AM  Result Value Ref Range   Sodium 136 135 - 145 mmol/L   Potassium 3.9 3.5 - 5.1 mmol/L   Chloride 105 98 - 111 mmol/L   CO2 23 22 - 32 mmol/L   Glucose, Bld 90 70 - 99 mg/dL   BUN 12 6 - 20 mg/dL   Creatinine, Ser 0.76 0.44 - 1.00 mg/dL   Calcium 9.6 8.9 - 10.3 mg/dL   Total Protein 8.4 (H) 6.5 - 8.1 g/dL   Albumin 3.7 3.5 - 5.0 g/dL   AST 23 15 - 41 U/L   ALT 30 0 - 44 U/L   Alkaline Phosphatase 112 38 - 126 U/L   Total Bilirubin 0.7 0.3 - 1.2 mg/dL   GFR, Estimated >60 >60 mL/min   Anion gap 8 5 - 15  CBC     Status: Abnormal   Collection Time: 04/25/21  6:44 AM  Result Value Ref Range   WBC 5.8 4.0 - 10.5 K/uL   RBC 3.79 (L) 3.87 - 5.11 MIL/uL   Hemoglobin 11.8 (L) 12.0 - 15.0 g/dL   HCT 35.7 (L) 36.0 - 46.0 %   MCV 94.2 80.0 - 100.0 fL   MCH 31.1 26.0 - 34.0 pg   MCHC 33.1 30.0 - 36.0 g/dL   RDW 14.6 11.5 - 15.5 %   Platelets 357 150 - 400 K/uL   nRBC 0.0 0.0 - 0.2 %  I-Stat beta hCG blood, ED     Status: None   Collection Time: 04/25/21  6:48 AM  Result Value Ref Range   I-stat hCG, quantitative <5.0 <5 mIU/mL    Comment 3            US PELVIC COMPLETE WITH TRANSVAGINAL  Result Date: 04/25/2021 CLINICAL DATA:  Tubo-ovarian abscess EXAM: TRANSABDOMINAL AND TRANSVAGINAL ULTRASOUND OF PELVIS TECHNIQUE: Both transabdominal and transvaginal ultrasound examinations of the pelvis were performed. Transabdominal technique was performed for global imaging of the pelvis including uterus, ovaries, adnexal regions, and pelvic cul-de-sac. It was necessary to proceed with endovaginal exam following the transabdominal exam to visualize the uterus, endometrium, ovaries and adnexa. COMPARISON:  CT 04/14/2021 FINDINGS: Uterus Measurements: 6.9 x 4.5 x 3.8 cm = volume: 62 mL. No fibroids or other mass visualized. Endometrium Thickness: 7 mm in thickness. The endometrial canal is distended with complex fluid. Right ovary Measurements: Unable to definitively differentiate to measure. Small cystic areas in the right adnexa measuring up to 2.5 cm. Left ovary Measurements: Unable to definitively differentiate to measure. 2.8 cm cystic area within the left adnexa along with a large tubular structure containing complex fluid. Other findings No abnormal free fluid. IMPRESSION: Difficult to visualize/differentiate the ovaries from cystic areas within the adnexa bilaterally. There is a large dilated tubular structure in the left adnexa containing complex fluid/debris concerning for pyosalpinx/tubo-ovarian abscess. Distention of the endometrial canal with complex fluid. Electronically Signed   By: Rolm Baptise M.D.   On: 04/25/2021 08:50    Assessment/Plan: Left TOA  Will admit for IV antibiotics. Hopefully pain will resolve. Will give 48-72 hrs to do so. Consider IR consult but I am doubtful they can be of any assistance. Pt is not the ideal surgerical candidate at this time  Chancy Milroy 04/25/2021, 4:05 PM

## 2021-04-25 NOTE — Plan of Care (Signed)
  Problem: Education: Goal: Knowledge of General Education information will improve Description Including pain rating scale, medication(s)/side effects and non-pharmacologic comfort measures Outcome: Progressing   

## 2021-04-25 NOTE — ED Provider Notes (Signed)
[facility] EMERGENCY DEPARTMENT Provider Note   CSN: [id number] Arrival date & time: 04/25/21  1324     History Chief Complaint  Patient presents with  . Abdominal Pain    Kelsey Rodriguez is a 30 y.o. female.  HPI patient presents with left lower quadrant pain.  Feels similar to her TOA pain for which she was admitted a couple of weeks ago.  Has gradually worsened and she says it never fully went away.  She says she left the hospital last Friday with a persistent burning sensation that was tolerable but now she is having worsening pain with intermittent severe cramps as well as a constant burning pain.  She rates her pain at a 6 out of 10 at rest but gets a lot worse whenever she tries to walk or move around.  She has had a couple of episodes of emesis and a couple of fevers at home over the past week but no consistent fevers.  She has greenish vaginal discharge which is new.  Continues to have urinary urgency which she says is not new.  Endorses compliance with her antibiotics and her Biktarvy.  Last takes Augmentin and doxycycline for the left-sided TOA, mepron for CNS toxo, biktarvy for HIV.      Past Medical History:  Diagnosis Date  . 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 Adrenal insufficiency ([place])   . Anemia of chronic disease 03/11/2012  . Back pain of lumbar region with sciatica 02/12/2015  . Bell's palsy 08/26/2013  . Brain lesion   . 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 Chronic back pain   . Chronic leg pain    bilateral knees, ankles  . Depression   . Fatigue   . GERD (gastroesophageal reflux disease)   . Headache   . 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 HIV (human immunodeficiency virus infection) ([place]) 02/2012  . Laceration of ankle, right 11/18/2012  . Lumbar radiculopathy   . Meningitis 02/18/2018  . Pelvic pain   . PID (acute pelvic inflammatory disease) 02/26/2018  . Pneumonia   . Reflux esophagitis 03/11/2012  . Seizure ([place])   . Tuberculosis   .  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 Vertigo   . Wears glasses     Patient Active Problem List   Diagnosis Date Noted  . Tubo-ovarian abscess 04/25/2021  . TOA (tubo-ovarian abscess) 04/15/2021  . Cervical dysplasia, mild 12/06/2020  . Visit for routine gyn exam 11/01/2020  . STD exposure 11/01/2020  . S/P craniotomy 05/16/2020  . Brain tumor ([place]) 05/16/2020  . 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 Hypertension   . Pain of upper abdomen   . Suicide ideation   . 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 Seizure ([place])   . Intracranial mass   . Intractable headache 02/04/2020  . 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 Rotator cuff strain 01/26/2020  . 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 AIDS (acquired immune deficiency syndrome) ([place]) 11/07/2019  . Cerebral edema ([place]) 10/28/2019  . Midline shift of brain   . Pruritus 08/29/2019  . Tendinopathy of left shoulder 01/18/2019  . Chronic pelvic pain in female 01/04/2019  . Lower abdominal pain 06/21/2018  . Syphilis 02/26/2018  . Tuberculosis   . Infertility, female 02/12/2018  . 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 Acute right-sided low back pain with right-sided sciatica 08/24/2017  . Complex regional pain syndrome 02/03/2017  .  Headache 10/28/2016  . Avascular necrosis of bone of right hip ([place]) 04/04/2016  . 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 Avascular necrosis of bone of left hip ([place]) 12/14/2015  . 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 Nausea 05/18/2015  . Vertigo 01/23/2015  . Primary adrenal insufficiency ([place]) 01/03/2015  . Chest pain 07/07/2013  . HIV (human immunodeficiency virus infection) ([place]) 03/16/2012  . 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 APPENDECTOMY  ~ 2000  .  APPLICATION OF CRANIAL NAVIGATION N/A 05/16/2020   Procedure: APPLICATION OF CRANIAL NAVIGATION;  Surgeon: Newman Pies, MD;  Location: [place];  Service: Neurosurgery;  Laterality: N/A;  . CRANIOTOMY Right 05/16/2020   Procedure: Craniotomy for Resection of Lesion;  Surgeon: Newman Pies, MD;  Location: [place];  Service: Neurosurgery;  Laterality: Right;  right  . DILATION AND CURETTAGE OF UTERUS  2008  . ESOPHAGOGASTRODUODENOSCOPY  03/11/2012   Procedure: ESOPHAGOGASTRODUODENOSCOPY (EGD);  Surgeon: Lafayette Dragon, MD;  Location: [facility] ENDOSCOPY;  Service: Endoscopy;  Laterality: N/A;  . ESOPHAGOGASTRODUODENOSCOPY N/A 03/07/2014   Procedure: ESOPHAGOGASTRODUODENOSCOPY (EGD);  Surgeon: Gatha Mayer, MD;  Location: [facility] ENDOSCOPY;  Service: Endoscopy;  Laterality: N/A;  . LUNG BIOPSY  02/2012  . TOTAL HIP ARTHROPLASTY Left 12/14/2015   Procedure: LEFT TOTAL HIP ARTHROPLASTY ANTERIOR APPROACH;  Surgeon: Mcarthur Rossetti, MD;  Location: [facility];  Service: Orthopedics;  Laterality: Left;  . TOTAL HIP ARTHROPLASTY Right 04/04/2016   Procedure: RIGHT TOTAL HIP ARTHROPLASTY ANTERIOR APPROACH;  Surgeon: Mcarthur Rossetti, MD;  Location: [facility];  Service: Orthopedics;  Laterality: Right;     OB History    Gravida  1   Para  0   Term  0   Preterm  0   AB  1   Living  0     SAB  1   IAB  0   Ectopic  0   Multiple  0   Live Births              Family History  Problem Relation Age of Onset  . Heart disease Father        Vague not clearly cardiac  . Hypertension Mother     Social History   Tobacco Use  . Smoking status: Never Smoker  . Smokeless tobacco: Never Used  Vaping Use  . Vaping Use: Never used  Substance Use Topics  . Alcohol use: Not Currently    Alcohol/week: 0.0 standard drinks    Comment: socially  . Drug use: No    Home Medications Prior to Admission medications   Medication Sig Start Date End Date Taking? Authorizing Provider   amoxicillin-clavulanate (AUGMENTIN) 875-125 MG tablet Take 1 tablet by mouth 2 (two) times daily for 28 days. Patient taking differently: Take 1 tablet by mouth See admin instructions. Bid x 28 days 04/19/21 05/17/21 Yes Anyanwu, Sallyanne Havers, MD  atovaquone (MEPRON) 750 MG/5ML suspension Take 10 mLs (1,500 mg total) by mouth 2 (two) times daily with a meal. Patient taking differently: Take 1,500 mg by mouth See admin instructions. Bid x 6 days 04/19/21 04/19/22 Yes Anyanwu, Sallyanne Havers, MD  bictegravir-emtricitabine-tenofovir AF (BIKTARVY) 50-200-25 MG TABS tablet TAKE 1 TABLET BY MOUTH DAILY. 04/19/21 04/19/22 Yes Anyanwu, Sallyanne Havers, MD  doxycycline (VIBRA-TABS) 100 MG tablet Take 1 tablet (100 mg total) by mouth 2 (two) times daily for 28 days. Patient taking differently:  Take 100 mg by mouth See admin instructions. Bid x 28 days 04/19/21 05/17/21 Yes Anyanwu, Sallyanne Havers, MD  DULoxetine (CYMBALTA) 20 MG capsule Take 40 mg by mouth daily. 04/09/21  Yes [provider]  ibuprofen (ADVIL) 800 MG tablet Take 1 tablet (800 mg total) by mouth 3 (three) times daily as needed for headache or moderate pain. 04/19/21 04/19/22 Yes Anyanwu, Sallyanne Havers, MD  oxyCODONE (OXY IR/ROXICODONE) 5 MG immediate release tablet Take 5 mg by mouth every 4 (four) hours as needed for severe pain.   Yes [provider]  cyclobenzaprine (FLEXERIL) 5 MG tablet Take 5 mg by mouth at bedtime as needed for muscle spasms. Patient not taking: Reported on 04/24/2021 03/07/21   [provider]  fluconazole (DIFLUCAN) 150 MG tablet TAKE 1 TABLET BY MOUTH WEEKLY AS NEEDED Patient not taking: No sig reported 03/04/21 03/04/22  Nolene Ebbs, MD  SUMAtriptan (IMITREX) 50 MG tablet Take 1 tablet (50 mg total) by mouth every 2 (two) hours as needed for migraine (Maximum dose: 100 mg per dose; 200 mg per 24 hours). May repeat in 2 hours if headache persists or recurs. 10/26/19 01/31/20  Caroline More, DO    Allergies    Hydrocodone and  Tramadol  Review of Systems   Review of Systems  Constitutional: Negative for chills and fever.  HENT: Negative for ear pain and sore throat.   Eyes: Negative for pain and visual disturbance.  Respiratory: Negative for cough and shortness of breath.   Cardiovascular: Negative for chest pain and palpitations.  Gastrointestinal: Positive for abdominal pain and vomiting.  Genitourinary: Positive for dysuria and urgency.  Musculoskeletal: Negative for arthralgias and back pain.  Skin: Negative for color change and rash.  Neurological: Negative for seizures and syncope.  All other systems reviewed and are negative.   Physical Exam Updated Vital Signs BP 129/89   Pulse 75   Temp 98.6 F (37 C) (Oral)   Resp 18   Ht 5\' 7"  (1.702 m)   Wt 79.4 kg   LMP 03/31/2021   SpO2 100%   BMI 27.41 kg/m   Physical Exam Vitals and nursing note reviewed.  Constitutional:      General: She is not in acute distress.    Appearance: She is well-developed.  HENT:     Head: Normocephalic and atraumatic.  Eyes:     Conjunctiva/sclera: Conjunctivae normal.  Cardiovascular:     Rate and Rhythm: Normal rate and regular rhythm.     Heart sounds: No murmur heard.   Pulmonary:     Effort: Pulmonary effort is normal. No respiratory distress.     Breath sounds: Normal breath sounds.  Abdominal:     Palpations: Abdomen is soft.     Tenderness: There is abdominal tenderness in the left lower quadrant. There is rebound (LLQ).  Musculoskeletal:     Cervical back: Neck supple.  Skin:    General: Skin is warm and dry.  Neurological:     Mental Status: She is alert.     ED Results / Procedures / Treatments   Labs (all labs ordered are listed, but only abnormal results are displayed) Labs Reviewed  COMPREHENSIVE METABOLIC PANEL - Abnormal; Notable for the following components:      Result Value   Total Protein 8.4 (*)    All other components within normal limits  CBC - Abnormal; Notable for the  following components:   RBC 3.79 (*)    Hemoglobin 11.8 (*)  HCT 35.7 (*)    All other components within normal limits  URINALYSIS, ROUTINE W REFLEX MICROSCOPIC - Abnormal; Notable for the following components:   APPearance CLOUDY (*)    Hgb urine dipstick MODERATE (*)    Protein, ur 30 (*)    Leukocytes,Ua LARGE (*)    WBC, UA >50 (*)    Bacteria, UA MANY (*)    All other components within normal limits  SARS CORONAVIRUS 2 (TAT 6-24 HRS)  LIPASE, BLOOD  GENTAMICIN LEVEL, RANDOM  I-STAT BETA HCG BLOOD, ED ([facility], [facility], [facility] ONLY)    EKG None  Radiology US PELVIC COMPLETE WITH TRANSVAGINAL  Result Date: 04/25/2021 CLINICAL DATA:  Tubo-ovarian abscess EXAM: TRANSABDOMINAL AND TRANSVAGINAL ULTRASOUND OF PELVIS TECHNIQUE: Both transabdominal and transvaginal ultrasound examinations of the pelvis were performed. Transabdominal technique was performed for global imaging of the pelvis including uterus, ovaries, adnexal regions, and pelvic cul-de-sac. It was necessary to proceed with endovaginal exam following the transabdominal exam to visualize the uterus, endometrium, ovaries and adnexa. COMPARISON:  CT 04/14/2021 FINDINGS: Uterus Measurements: 6.9 x 4.5 x 3.8 cm = volume: 62 mL. No fibroids or other mass visualized. Endometrium Thickness: 7 mm in thickness. The endometrial canal is distended with complex fluid. Right ovary Measurements: Unable to definitively differentiate to measure. Small cystic areas in the right adnexa measuring up to 2.5 cm. Left ovary Measurements: Unable to definitively differentiate to measure. 2.8 cm cystic area within the left adnexa along with a large tubular structure containing complex fluid. Other findings No abnormal free fluid. IMPRESSION: Difficult to visualize/differentiate the ovaries from cystic areas within the adnexa bilaterally. There is a large dilated tubular structure in the left adnexa containing complex fluid/debris concerning for pyosalpinx/tubo-ovarian  abscess. Distention of the endometrial canal with complex fluid. Electronically Signed   By: Rolm Baptise M.D.   On: 04/25/2021 08:50    Procedures Procedures   Medications Ordered in ED Medications  lactated ringers infusion ( Intravenous New Bag/Given 04/26/21 0846)  prenatal multivitamin tablet 1 tablet (1 tablet Oral Given 04/26/21 1320)  lactated ringers infusion (0 mLs Intravenous Stopped 04/25/21 1914)  clindamycin (CLEOCIN) IVPB 900 mg (900 mg Intravenous New Bag/Given 04/26/21 1318)  docusate sodium (COLACE) capsule 100 mg (100 mg Oral Not Given 04/26/21 0928)  ondansetron (ZOFRAN) tablet 4 mg (has no administration in time range)    Or  ondansetron (ZOFRAN) injection 4 mg (has no administration in time range)  ibuprofen (ADVIL) tablet 600 mg (has no administration in time range)  ampicillin (OMNIPEN) 2 g in sodium chloride 0.9 % 100 mL IVPB (2 g Intravenous New Bag/Given 04/26/21 1610)  gentamicin (GARAMYCIN) 430 mg in dextrose 5 % 100 mL IVPB (430 mg Intravenous New Bag/Given 04/26/21 1104)  atovaquone (MEPRON) 750 MG/5ML suspension 1,500 mg (1,500 mg Oral Given 04/26/21 1610)  bictegravir-emtricitabine-tenofovir AF (BIKTARVY) 50-200-25 MG per tablet 1 tablet (1 tablet Oral Given 04/26/21 0928)  DULoxetine (CYMBALTA) DR capsule 40 mg (40 mg Oral Given 04/26/21 0928)  zolpidem (AMBIEN) tablet 5 mg (has no administration in time range)  oxyCODONE-acetaminophen (PERCOCET/ROXICET) 5-325 MG per tablet 1 tablet (1 tablet Oral Given 04/26/21 0834)  morphine 4 MG/ML injection 4 mg (4 mg Intravenous Given 04/25/21 0840)    ED Course  I have reviewed the triage vital signs and the nursing notes.  Pertinent labs & imaging results that were available during my care of the patient were reviewed by me and considered in my medical decision making (see chart for details).  MDM Rules/Calculators/A&P                          P/w continued LLQ abdominal pain. VSS not c/w sepsis. Labs with  infectious appearing urinalysis which is likely c/w TOA. No leukocytosis. Will obtain pelvic US for further evaluation. Last CD4 136 last month. Blood cx 5/2 negative.   Lab studies overall unremarkable.  No leukocytosis.  Ultrasound shows pyosalpinx and tubo-ovarian abscess.  Discussed with gynecology who agreed with admission.  I discussed that I had not drawn cultures or start IV antibiotics given the patient's stability and lack of bacteremic findings and OB/GYN just requested IV fluids which were ordered. Pt admitted to OB/Gyn for further management.   Final Clinical Impression(s) / ED Diagnoses Final diagnoses:  TOA (tubo-ovarian abscess)    Rx / DC Orders ED Discharge Orders    None       Aris Lot, MD 04/26/21 [place], [place], DO 04/26/21 2300

## 2021-04-25 NOTE — ED Triage Notes (Signed)
Pt reports lower abdominal pain. Was d/c'd from hospital last Friday for an ovarian abscess. Pain is not getting better. Reports nausea. Denies vomiting and diarrhea.

## 2021-04-26 LAB — GENTAMICIN LEVEL, RANDOM: Gentamicin Rm: 1.5 ug/mL

## 2021-04-26 NOTE — Progress Notes (Signed)
Pharmacy Antibiotic Note  Kelsey Rodriguez is a 30 y.o. female admitted on 04/25/2021 with abdominal pain.  Pharmacy has been consulted for gentamicin dosing for concern TOA. UA with large dilated tubular structure concerning for pyosalpinx/tubo-ovarian abscess. Patient on clindamycin and ampicillin per MD. Patient recently hospitalized for [facility] and discharged on Augmentin/Doxycycline. Scr at baseline.   Gentamicin 10 hour level 1.5 so qualifies for the q24h dosing per [place] extended interval dosing nomogram  Plan: Continue gentamicin 430 mg (7mg /kg IBW q24h  Continue ampicillin and clindamycin per MD  Monitor renal function closely  Height: 5\' 7"  (170.2 cm) Weight: 79.4 kg (175 lb) IBW/kg (Calculated) : 61.6  Temp (24hrs), Avg:98.5 F (36.9 C), Min:98.2 F (36.8 C), Max:98.8 F (37.1 C)  Recent Labs  Lab 04/25/21 0644 04/25/21 2314  WBC 5.8  --   CREATININE 0.76  --   GENTRANDOM  --  1.5    Estimated Creatinine Clearance: 109.5 mL/min (by C-G formula based on SCr of 0.76 mg/dL).    Allergies  Allergen Reactions  . Hydrocodone Itching and Nausea Only    Tolerates Oxycodone  . Tramadol Itching and Nausea Only    Tolerates oxycodone    Antimicrobials this admission: Clindamycin 5/12 >>  Ampicillin 5/12 >>  Gentamicin 5/12 >>     Thank you for allowing pharmacy to be a part of this patient's care.  Sherlon Handing, PharmD, BCPS Please see amion for complete clinical pharmacist phone list 04/26/2021 12:31 AM

## 2021-04-26 NOTE — Progress Notes (Signed)
HD # 1 Left TOA  Subjective: Patient reports feeling better today. Pain is less. Tolerating diet    Objective: AF VSS  Lungs clear Heart RRR Abd soft, BS, less LLQ tenderness, no rebound   Assessment/Plan: Left TOA HIV  Pain improved. Continue with IV antibiotics. Remains afebrile. Has appt June 7 with [facility].   LOS: 1 day    Chancy Milroy 04/26/2021, 12:57 PM

## 2021-04-26 NOTE — Plan of Care (Signed)
  Problem: Education: Goal: Knowledge of General Education information will improve Description: Including pain rating scale, medication(s)/side effects and non-pharmacologic comfort measures Outcome: Progressing   Problem: Health Behavior/Discharge Planning: Goal: Ability to manage health-related needs will improve Outcome: Progressing   Problem: Clinical Measurements: Goal: Ability to maintain clinical measurements within normal limits will improve Outcome: Progressing Goal: Will remain free from infection Outcome: Progressing Goal: Diagnostic test results will improve Outcome: Progressing Goal: Respiratory complications will improve Outcome: Progressing Goal: Cardiovascular complication will be avoided Outcome: Progressing   Problem: Activity: Goal: Risk for activity intolerance will decrease Outcome: Progressing   Problem: Nutrition: Goal: Adequate nutrition will be maintained Outcome: Progressing   Problem: Coping: Goal: Level of anxiety will decrease Outcome: Progressing   Problem: Elimination: Goal: Will not experience complications related to bowel motility Outcome: Progressing   Problem: Pain Managment: Goal: General experience of comfort will improve Outcome: Progressing   Problem: Safety: Goal: Ability to remain free from injury will improve Outcome: Progressing   

## 2021-04-27 DIAGNOSIS — B20 Human immunodeficiency virus [HIV] disease: Secondary | ICD-10-CM

## 2021-04-27 NOTE — Progress Notes (Signed)
GYN Progress note  S: Patient feeling better today.  She still says she has pain that comes and goes, rates her pain 4/10.  She has had some nausea, but not currently.  No vomiting.  Tolerating gen diet.  Ambulating and voiding without difficulty.  O: BP 117/85 (BP Location: Right Arm)   Pulse 73   Temp 98.7 F (37.1 C) (Oral)   Resp 18   Ht 5\' 7"  (1.702 m)   Wt 79.4 kg   LMP 03/31/2021   SpO2 100%   BMI 27.41 kg/m   GEN: NAD CV: RRR Lungs: CTAB Abd: soft, +LLQ tenderness, no rebound, no guarding Ext: no edema, no calf tenderness  No results found for this or any previous visit (from the past 24 hour(s)).  A/P: 30yo admitted for recurring hydrosalpinx, known HIV+ 1) TOA -currently on IV Amp/Gent/Clinda -remains afebrile, clinically doing better  DISPO: Plan for ~ 72hr of IV antibiotics- will review with pharmacy plan for oral antibiotics as patient reports significant nausea/vomiting with prior antibiotics.  Plan for surgical intervention with [facility]  Janyth Pupa, DO Attending [place], [facility] for [facility], [place]

## 2021-04-28 DIAGNOSIS — N7093 Salpingitis and oophoritis, unspecified: Principal | ICD-10-CM

## 2021-04-28 MED ORDER — OXYCODONE-ACETAMINOPHEN 5-325 MG PO TABS: 1.0000 | ORAL_TABLET | Freq: Four times a day (QID) | ORAL | 0 refills | Status: DC | PRN

## 2021-04-28 MED ORDER — CLINDAMYCIN HCL 300 MG PO CAPS: 300.0000 mg | ORAL_CAPSULE | Freq: Three times a day (TID) | ORAL | 0 refills | Status: DC

## 2021-04-28 MED ORDER — IBUPROFEN 600 MG PO TABS: 600.0000 mg | ORAL_TABLET | Freq: Four times a day (QID) | ORAL | 0 refills | Status: DC | PRN

## 2021-04-28 NOTE — Discharge Instructions (Signed)
Pelvic Inflammatory Disease  Pelvic inflammatory disease (PID) is an infection in some or all of the female reproductive organs. PID can be in the womb (uterus), ovaries, fallopian tubes, or nearby tissues that are inside the lower belly area (pelvis). PID can lead to problems if it is not treated. What are the causes?  Germs (bacteria) that are spread during sex. This is the most common cause.  Germs in the vagina that are not spread during sex.  Germs that travel up from the vagina or cervix to the reproductive organs after: ? The birth of a baby. ? A miscarriage. ? An abortion. ? Pelvic surgery. ? Insertion of an intrauterine device (IUD). ? A sexual assault. What increases the risk?  Being younger than 32 years old.  Having sex at a young age.  Having a history of STI (sexually transmitted infection) or PID.  Not using barrier birth control, such as condoms.  Having a lot of sex partners.  Having sex with someone who has symptoms of an STI.  Using a douche.  Having an IUD put in place. What are the signs or symptoms?  Pain in the belly area.  Fever.  Chills.  Discharge from the vagina that is not normal.  Bleeding from the womb that is not normal.  Pain soon after the end of a menstrual period.  Pain when you pee (urinate).  Pain with sex.  Feeling sick to your stomach (nauseous) or throwing up (vomiting). How is this treated?  Antibiotic medicines. In very bad cases, these may be given through an IV tube.  Surgery. This is rare.  Efforts to stop the spread of the infection. Sex partners may need to be treated. It may take weeks until you feel all better. Your doctor may test you for infection again after you finish treatment. You should also be checked for HIV (human immunodeficiency virus). Follow these instructions at home:  Take over-the-counter and prescription medicines only as told by your doctor.  If you were prescribed an antibiotic  medicine, take it as told by your doctor. Do not stop taking it even if you start to feel better.  Do not have sex until treatment is done or as told by your doctor.  Tell your sex partner if you have PID. Your partner may need to be treated.  Keep all follow-up visits as told by your doctor. This is important. Contact a doctor if:  You have more fluid or fluid that is not normal coming from your vagina.  Your pain does not improve.  You throw up.  You have a fever.  You cannot take your medicines.  Your partner has an STI.  You have pain when you pee. Get help right away if:  You have more pain in the belly area.  You have chills.  You are not better in 72 hours with treatment. Summary  Pelvic inflammatory disease (PID) is caused by an infection in some or all of the female reproductive organs.  PID is a serious infection.  This infection is most often treated with antibiotics.  Do not have sex until treatment is done or as told by your doctor. This information is not intended to replace advice given to you by your health care provider. Make sure you discuss any questions you have with your health care provider. Document Revised: 08/19/2018 Document Reviewed: 08/25/2018 Elsevier Patient Education  2021 [employer].

## 2021-04-28 NOTE — Discharge Summary (Addendum)
Physician Discharge Summary  Patient ID: Kelsey Rodriguez MRN: [id number] DOB/AGE: 01-03-1991 30 y.o.  Admit date: 04/25/2021 Discharge date: 04/28/2021  Admission [place] TOA  Discharge Diagnoses:  Active Problems:   Tubo-ovarian abscess   Discharged Condition: good  Hospital Course: Kelsey Rodriguez is an 30 y.o. female with known left TOA. She was admitted to our service from 5/1-5/6 and treated with IV antibiotics. Pain resolved. She was discharged home on a 28 days course of Augmentin. And instructed to follow up with her OB/GYN at [facility]  She returned to ER with reports that shortly after discharge LLQ pain returned. Has daily and can be 10/10. Denies any fever, chills, or N/V.   U/S in ER  essentially unchanged from prior admission. She was readmitted and placed on IV antibiotics ampicillin clindamycin and gentamycin. Blood culture showed staph epidermatis but this was likely spurious and not significant. She responded well to therapy and had no pain at discharge and was afebrile. She reported she vomited with augmentin and doxycycline so these were not prescribed at discharge  Consults: ID, pharmacy  Significant Diagnostic Studies: labs: cbc, microbiology: blood culture: positive for staph epi and radiology: TOA  Treatments: IV hydration and antibiotics: gentamycin and ampicillin and cleocin  Discharge Exam: Blood pressure 120/78, pulse 64, temperature 98.5 F (36.9 C), temperature source Oral, resp. rate 16, height 5\' 7"  (1.702 m), weight 79.4 kg, last menstrual period 03/31/2021, SpO2 100 %. General appearance: alert, cooperative and no distress Resp: normal effort GI: soft, non-tender; bowel sounds normal; no masses,  no organomegaly  Disposition: Discharge disposition: 01-Home or Self Care       Discharge Instructions    Discharge patient   Complete by: As directed    Discharge after am IV abx completed   Discharge disposition: 01-Home or  Self Care   Discharge patient date: 04/28/2021     Allergies as of 04/28/2021      Reactions   Hydrocodone Itching, Nausea Only   Tolerates Oxycodone   Tramadol Itching, Nausea Only   Tolerates oxycodone      Medication List    STOP taking these medications   amoxicillin-clavulanate 875-125 MG tablet Commonly known as: Augmentin   cyclobenzaprine 5 MG tablet Commonly known as: FLEXERIL   doxycycline 100 MG tablet Commonly known as: VIBRA-TABS   fluconazole 150 MG tablet Commonly known as: DIFLUCAN     TAKE these medications   atovaquone 750 MG/5ML suspension Commonly known as: MEPRON Take 10 mLs (1,500 mg total) by mouth 2 (two) times daily with a meal. What changed:   when to take this  additional instructions   Biktarvy 50-200-25 MG Tabs tablet Generic drug: bictegravir-emtricitabine-tenofovir AF TAKE 1 TABLET BY MOUTH DAILY.   clindamycin 300 MG capsule Commonly known as: Cleocin Take 1 capsule (300 mg total) by mouth 3 (three) times daily.   DULoxetine 20 MG capsule Commonly known as: CYMBALTA Take 40 mg by mouth daily.   ibuprofen 600 MG tablet Commonly known as: ADVIL Take 1 tablet (600 mg total) by mouth every 6 (six) hours as needed (mild pain). What changed:   medication strength  how much to take  when to take this  reasons to take this   oxyCODONE 5 MG immediate release tablet Commonly known as: Oxy IR/ROXICODONE Take 5 mg by mouth every 4 (four) hours as needed for severe pain.   oxyCODONE-acetaminophen 5-325 MG tablet Commonly known as: PERCOCET/ROXICET Take 1 tablet by mouth every 6 (six) hours as  needed for moderate pain or severe pain.       Follow-up Information    Lonia Skinner, MD. Call in 2 week(s).   Specialty: Obstetrics and Gynecology Why: has appt 6/7 Contact information: [address] [place] [address] [phone]               Signed: Emeterio Reeve 04/28/2021, 8:34 AM

## 2021-05-01 ENCOUNTER — Ambulatory Visit: Payer: Medicaid Other | Admitting: Internal Medicine

## 2021-05-01 ENCOUNTER — Other Ambulatory Visit: Payer: Medicaid Other

## 2021-05-01 ENCOUNTER — Other Ambulatory Visit (HOSPITAL_COMMUNITY): Payer: Self-pay

## 2021-05-02 ENCOUNTER — Telehealth: Payer: Self-pay

## 2021-05-02 NOTE — Telephone Encounter (Signed)
Patient called to report she's developed dizziness within the last day or so. It's not constant. Also reporting slightly increased confusion. When she sits it does improve. No headaches reported. She reports she's taking her medication and hasn't missed any doses of HIV meds or toxo meds. Patient was previously on keppra for seizures and reports that did help with dizziness but doesn't have any more. RN reviewed med list and there is no active rx for medication. Forwarding to provider for advice/possible prescription.   Llewelyn Sheaffer Lorita Officer, RN

## 2021-05-03 NOTE — Telephone Encounter (Signed)
Left voicemail requesting call back to schedule afternoon appointment with Dr. Baxter Flattery.   Marty Sadlowski Lorita Officer, RN

## 2021-05-03 NOTE — Telephone Encounter (Signed)
She missed her appt this week. Can she come in this afternoon to be seen

## 2021-05-06 ENCOUNTER — Other Ambulatory Visit (HOSPITAL_COMMUNITY): Payer: Self-pay

## 2021-05-09 ENCOUNTER — Encounter: Payer: Self-pay | Admitting: *Deleted

## 2021-05-09 ENCOUNTER — Ambulatory Visit: Payer: Medicaid Other | Admitting: Adult Health

## 2021-05-14 ENCOUNTER — Other Ambulatory Visit (HOSPITAL_COMMUNITY): Payer: Self-pay

## 2021-05-15 HISTORY — PX: OTHER SURGICAL HISTORY: SHX169

## 2021-05-15 HISTORY — PX: LAPAROSCOPIC OOPHERECTOMY: SHX6507

## 2021-05-17 DIAGNOSIS — N7093 Salpingitis and oophoritis, unspecified: Secondary | ICD-10-CM | POA: Diagnosis not present

## 2021-05-30 ENCOUNTER — Ambulatory Visit: Payer: Medicaid Other | Admitting: Adult Health

## 2021-06-04 ENCOUNTER — Ambulatory Visit (INDEPENDENT_AMBULATORY_CARE_PROVIDER_SITE_OTHER): Payer: Medicare Other | Admitting: Internal Medicine

## 2021-06-04 ENCOUNTER — Other Ambulatory Visit (HOSPITAL_COMMUNITY): Payer: Self-pay

## 2021-06-04 ENCOUNTER — Other Ambulatory Visit: Payer: Self-pay

## 2021-06-04 ENCOUNTER — Encounter: Payer: Self-pay | Admitting: Internal Medicine

## 2021-06-04 VITALS — Temp 98.0°F | Ht 64.0 in

## 2021-06-04 DIAGNOSIS — N7093 Salpingitis and oophoritis, unspecified: Secondary | ICD-10-CM

## 2021-06-04 DIAGNOSIS — B582 Toxoplasma meningoencephalitis: Secondary | ICD-10-CM | POA: Diagnosis not present

## 2021-06-04 DIAGNOSIS — Z79899 Other long term (current) drug therapy: Secondary | ICD-10-CM | POA: Diagnosis not present

## 2021-06-04 DIAGNOSIS — B20 Human immunodeficiency virus [HIV] disease: Secondary | ICD-10-CM | POA: Diagnosis not present

## 2021-06-04 MED ORDER — ATOVAQUONE 750 MG/5ML PO SUSP
1500.0000 mg | Freq: Two times a day (BID) | ORAL | 11 refills | Status: DC
  Filled 2021-06-04: qty 600, 30d supply, fill #0
  Filled 2021-08-27: qty 600, 30d supply, fill #1
  Filled 2021-11-12: qty 600, 30d supply, fill #2

## 2021-06-04 NOTE — Progress Notes (Signed)
RFV: follow up for hiv disease  Patient ID: Kelsey Rodriguez, female   DOB: [birth year], 30 y.o.   MRN: [id number]  HPI Kelsey Rodriguez is a 30yo F with history of poorly controlled hiv disease, cd 4 count < 200/VL 648 (better than prior months - in April 2022) CNS toxoplasmosis, has had chronic TOA s/p lap BS and left oophorectomy on 6/16. Given 14 days of levofloxacin and metronidazole post surgery. Next follow up on July 19th. Is constipated so she takes miralax BID. She reports that she is doing better with taking her hiv medications daily but she has Ran out of atovaquone for PCP proph and CNS toxo treatment  Outpatient Encounter Medications as of 06/04/2021  Medication Sig   bictegravir-emtricitabine-tenofovir AF (BIKTARVY) 50-200-25 MG TABS tablet TAKE 1 TABLET BY MOUTH DAILY.   clindamycin (CLEOCIN) 300 MG capsule Take 1 capsule (300 mg total) by mouth 3 (three) times daily.   ibuprofen (ADVIL) 600 MG tablet Take 1 tablet (600 mg total) by mouth every 6 (six) hours as needed (mild pain).   oxyCODONE (OXY IR/ROXICODONE) 5 MG immediate release tablet Take 5 mg by mouth every 4 (four) hours as needed for severe pain.   atovaquone (MEPRON) 750 MG/5ML suspension Take 10 mLs (1,500 mg total) by mouth 2 (two) times daily with a meal. (Patient taking differently: Take 1,500 mg by mouth See admin instructions. Bid x 6 days)   DULoxetine (CYMBALTA) 20 MG capsule Take 40 mg by mouth daily. (Patient not taking: Reported on 06/04/2021)   oxyCODONE-acetaminophen (PERCOCET/ROXICET) 5-325 MG tablet Take 1 tablet by mouth every 6 (six) hours as needed for moderate pain or severe pain. (Patient not taking: Reported on 06/04/2021)   [DISCONTINUED] SUMAtriptan (IMITREX) 50 MG tablet Take 1 tablet (50 mg total) by mouth every 2 (two) hours as needed for migraine (Maximum dose: 100 mg per dose; 200 mg per 24 hours). May repeat in 2 hours if headache persists or recurs.   No facility-administered encounter medications  on file as of 06/04/2021.     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[place])    Seizure ([place])    Intracranial mass    Intractable headache 02/04/2020   Encephalitis, myelitis, 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[place]) 01/31/2020   Rotator cuff strain 01/26/2020   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   AIDS (acquired immune deficiency syndrome) ([place]) 11/07/2019   Cerebral edema ([place]) 10/28/2019   Midline shift of brain    Pruritus 08/29/2019   Tendinopathy of left shoulder 01/18/2019   Chronic pelvic pain in female 01/04/2019   Lower abdominal pain 06/21/2018   Syphilis 02/26/2018   Tuberculosis    Infertility, female 02/12/2018   ASCUS with positive high risk HPV cervical 09/14/2017   Acute right-sided low back pain with right-sided sciatica 08/24/2017   Complex regional pain syndrome 02/03/2017   Headache 10/28/2016   Avascular necrosis of bone of right hip ([place]) 04/04/2016   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 Otherwise 12 point ros is negative Physical Exam   Temp 98 F (36.7 C)   Ht 5\' 4"  (1.626 m)   LMP 05/28/2021 (Approximate)   BMI 30.04 kg/m   Physical Exam  Constitutional:  oriented to person, place, and time. appears well-developed and well-nourished. No distress.  HENT: [place]/AT, PERRLA, no scleral icterus Mouth/Throat: Oropharynx is clear and moist. No oropharyngeal exudate.  Cardiovascular: Normal rate, regular rhythm and normal heart sounds. Exam reveals no gallop and no friction rub.  No murmur heard.  Pulmonary/Chest: Effort normal and breath sounds normal. No respiratory distress.  has no wheezes.  Neck = supple, no nuchal rigidity Abdominal: Soft. Bowel sounds are normal.  exhibits no distension. There is no tenderness. 5 port holes glue in place. No drainage Lymphadenopathy: no cervical adenopathy. No axillary adenopathy Neurological: alert and oriented to person, place, and time.  Skin: Skin is warm and dry. No rash noted. No erythema.  Psychiatric: a normal mood and affect.  behavior is normal.   Lab Results  Component Value Date   CD4TCELL 8 (L) 03/28/2021   Lab Results  Component Value Date   CD4TABS 136 (L) 03/28/2021   CD4TABS 214 (L) 11/20/2020   CD4TABS 190 (L) 10/10/2020   Lab Results  Component Value Date   HIV1RNAQUANT 648 (H) 03/28/2021   Lab Results  Component Value Date   HEPBSAB POS (A) 09/13/2015   Lab Results  Component Value Date   LABRPR Reactive (A) 04/15/2021    CBC Lab Results  Component Value Date   WBC 5.8 04/25/2021   RBC 3.79 (L) 04/25/2021   HGB 11.8 (L) 04/25/2021   HCT 35.7 (L) 04/25/2021   PLT 357 04/25/2021   MCV 94.2 04/25/2021   MCH 31.1 04/25/2021   MCHC 33.1 04/25/2021   RDW 14.6 04/25/2021   LYMPHSABS 1.7 04/17/2021   MONOABS 0.7 04/17/2021   EOSABS 0.2 04/17/2021    BMET Lab Results  Component Value Date   NA 136 04/25/2021    K 3.9 04/25/2021   CL 105 04/25/2021   CO2 23 04/25/2021   GLUCOSE 90 04/25/2021   BUN 12 04/25/2021   CREATININE 0.76 04/25/2021   CALCIUM 9.6 04/25/2021   GFRNONAA >60 04/25/2021   GFRAA >60 08/30/2020      Assessment and Plan  CNS toxo = we will refill continue atovaquone  Hiv disease= check labs in 2 wks; continue with biktarvy  Oi proph = PCP covered by atovaquone  Toa treatment s/p removal = finish up 10 day course of abtx. Still needs follow up with g[facility]c at [facility] to ensure she is having expected recovery.  Long term medication = creatinine appears stable

## 2021-06-04 NOTE — Progress Notes (Signed)
Offered covid booster, patient declined. Kelsey Rodriguez

## 2021-06-05 ENCOUNTER — Other Ambulatory Visit (HOSPITAL_COMMUNITY): Payer: Self-pay

## 2021-06-07 DIAGNOSIS — N9489 Other specified conditions associated with female genital organs and menstrual cycle: Secondary | ICD-10-CM | POA: Diagnosis not present

## 2021-06-07 DIAGNOSIS — B20 Human immunodeficiency virus [HIV] disease: Secondary | ICD-10-CM | POA: Diagnosis not present

## 2021-06-07 DIAGNOSIS — G4489 Other headache syndrome: Secondary | ICD-10-CM | POA: Diagnosis not present

## 2021-06-12 ENCOUNTER — Other Ambulatory Visit: Payer: Self-pay | Admitting: Family

## 2021-06-12 DIAGNOSIS — B20 Human immunodeficiency virus [HIV] disease: Secondary | ICD-10-CM

## 2021-06-14 DIAGNOSIS — Z419 Encounter for procedure for purposes other than remedying health state, unspecified: Secondary | ICD-10-CM | POA: Diagnosis not present

## 2021-06-28 ENCOUNTER — Other Ambulatory Visit (HOSPITAL_COMMUNITY): Payer: Self-pay

## 2021-07-03 ENCOUNTER — Other Ambulatory Visit (HOSPITAL_COMMUNITY): Payer: Self-pay

## 2021-07-25 ENCOUNTER — Other Ambulatory Visit (HOSPITAL_COMMUNITY): Payer: Self-pay

## 2021-07-30 ENCOUNTER — Other Ambulatory Visit: Payer: Medicare Other

## 2021-08-08 ENCOUNTER — Telehealth: Payer: Self-pay

## 2021-08-08 NOTE — Telephone Encounter (Signed)
[facility] Patient Advocate Encounter  [facility] and I have been unsuccsessful in reaching patient to be able to refill medication.    We have tried multiple times without a response.  Yui Mulvaney, CPhT Specialty Pharmacy Patient Advocate [facility] Phone: [phone] Fax:  [phone]  

## 2021-08-13 ENCOUNTER — Encounter: Payer: Medicare Other | Admitting: Internal Medicine

## 2021-08-23 ENCOUNTER — Telehealth: Payer: Self-pay

## 2021-08-23 NOTE — Telephone Encounter (Signed)
Called patient regarding mychart message. Patient states that her fever is running between 99-100.2. Has been taking ibuprofren to help with symptoms. Patient is scheduled to see Calone, FNP this Tuesday for 30 min appt.  Patient understands if she is feeling worse to go to urgent care for evaluation. Leatrice Jewels, RMA

## 2021-08-27 ENCOUNTER — Other Ambulatory Visit: Payer: Self-pay

## 2021-08-27 ENCOUNTER — Encounter: Payer: Self-pay | Admitting: Family

## 2021-08-27 ENCOUNTER — Other Ambulatory Visit (HOSPITAL_COMMUNITY): Payer: Self-pay

## 2021-08-27 ENCOUNTER — Ambulatory Visit (INDEPENDENT_AMBULATORY_CARE_PROVIDER_SITE_OTHER): Payer: Medicare Other | Admitting: Family

## 2021-08-27 VITALS — BP 117/81 | HR 83 | Temp 98.7°F | Wt 173.0 lb

## 2021-08-27 DIAGNOSIS — R509 Fever, unspecified: Secondary | ICD-10-CM | POA: Diagnosis not present

## 2021-08-27 DIAGNOSIS — B20 Human immunodeficiency virus [HIV] disease: Secondary | ICD-10-CM

## 2021-08-27 DIAGNOSIS — B582 Toxoplasma meningoencephalitis: Secondary | ICD-10-CM

## 2021-08-27 NOTE — Assessment & Plan Note (Signed)
New headaches concerning for progression of CNS toxoplasmosis secondary to less than optimal adherence to her ART regimen and atovaquone.  Obtain MRI.  Restart medications.  Further treatment as indicated pending MRI results.

## 2021-08-27 NOTE — Patient Instructions (Signed)
Nice to see you.  Restart taking your medications daily as prescribed.  We will get your medications sent to you.  We will get an MRI of your brain pending the blood work.  Continue follow up as planned with Dr. Baxter Flattery.  Recommend seeking further care if your symptoms worsen in the meantime.

## 2021-08-27 NOTE — Progress Notes (Signed)
Subjective:    Patient ID: Kelsey Rodriguez, female    DOB: May 06, 1991, 29 y.o.   MRN: [id number]  Chief Complaint  Patient presents with   Follow-up    Complains of fevers (99-100.1)    HPI:  Kelsey Rodriguez is a 30 y.o. female with poorly controlled HIV disease complicated by CNS toxoplasmosis infection last seen on 06/04/21 by Dr. Baxter Flattery for follow up and tubo-ovarian abscess with improved adherence to her ART regimen of Biktarvy and atovaquone. Here today for an acute Rodriguez visit.   Kelsey Rodriguez on 08/20/21 with fever and getting really hot at night.going on for approximately 7 days. She missed taking her HIV medication due to stressful situations. Highest temperatures at home has been 100.2 F. Fevers occur primarily during the night and during the day with headaches and overall malaise. Has been taking Tylenol and ibuprofen which does not help with the pain. On occasion light will bother her. Has not been doing good with medication and has been off medication for about 1 month now. Stress continues prohibit her from taking medications.    Allergies  Allergen Reactions   Hydrocodone Itching and Nausea Only    Tolerates Oxycodone   Tramadol Itching and Nausea Only    Tolerates oxycodone      Outpatient Medications Prior to Visit  Medication Sig Dispense Refill   Acetaminophen (TYLENOL PO) Take by mouth.     ibuprofen (ADVIL) 600 MG tablet Take 1 tablet (600 mg total) by mouth every 6 (six) hours as needed (mild pain). 30 tablet 0   atovaquone (MEPRON) 750 MG/5ML suspension Take 10 mLs (1,500 mg total) by mouth 2 (two) times daily with a meal. (Patient not taking: Reported on 08/27/2021) 600 mL 11   bictegravir-emtricitabine-tenofovir AF (BIKTARVY) 50-200-25 MG TABS tablet TAKE 1 TABLET BY MOUTH DAILY. (Patient not taking: Reported on 08/27/2021) 30 tablet 11   clindamycin (CLEOCIN) 300 MG capsule Take 1 capsule (300 mg total) by mouth 3 (three) times  daily. (Patient not taking: Reported on 08/27/2021) 30 capsule 0   DULoxetine (CYMBALTA) 20 MG capsule Take 40 mg by mouth daily. (Patient not taking: No sig reported)     oxyCODONE (OXY IR/ROXICODONE) 5 MG immediate release tablet Take 5 mg by mouth every 4 (four) hours as needed for severe pain. (Patient not taking: Reported on 08/27/2021)     oxyCODONE-acetaminophen (PERCOCET/ROXICET) 5-325 MG tablet Take 1 tablet by mouth every 6 (six) hours as needed for moderate pain or severe pain. (Patient not taking: No sig reported) 15 tablet 0   No facility-administered medications prior to visit.     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   HIV (human immunodeficiency virus infection) ([place]) 02/2012   Laceration of ankle, right 11/18/2012   Lumbar radiculopathy    Meningitis 02/18/2018   Pelvic pain    PID (acute pelvic inflammatory disease) 02/26/2018   Pneumonia    Reflux esophagitis 03/11/2012   Seizure ([place])  Tuberculosis    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   Vertigo    Wears glasses      Past Surgical History:  Procedure Laterality Date   APPENDECTOMY  ~ [id number]   APPLICATION OF CRANIAL NAVIGATION N/A 05/16/2020   Procedure: APPLICATION OF CRANIAL NAVIGATION;  Surgeon: Newman Pies, MD;  Location: [place];  Service: Neurosurgery;  Laterality: N/A;   CRANIOTOMY Right 05/16/2020   Procedure: Craniotomy for Resection of Lesion;  Surgeon: Newman Pies, MD;  Location: [place];  Service: Neurosurgery;  Laterality: Right;  right   DILATION AND CURETTAGE OF UTERUS  2008    ESOPHAGOGASTRODUODENOSCOPY  03/11/2012   Procedure: ESOPHAGOGASTRODUODENOSCOPY (EGD);  Surgeon: Lafayette Dragon, MD;  Location[facility] ENDOSCOPY;  Service: Endoscopy;  Laterality: N/A;   ESOPHAGOGASTRODUODENOSCOPY N/A 03/07/2014   Procedure: ESOPHAGOGASTRODUODENOSCOPY (EGD);  Surgeon: Gatha Mayer, MD;  Location[facility] ENDOSCOPY;  Service: Endoscopy;  Laterality: N/A;   LUNG BIOPSY  02/2012   TOTAL HIP ARTHROPLASTY Left 12/14/2015   Procedure: LEFT TOTAL HIP ARTHROPLASTY ANTERIOR APPROACH;  Surgeon: Mcarthur Rossetti, MD;  Location: [facility];  Service: Orthopedics;  Laterality: Left;   TOTAL HIP ARTHROPLASTY Right 04/04/2016   Procedure: RIGHT TOTAL HIP ARTHROPLASTY ANTERIOR APPROACH;  Surgeon: Mcarthur Rossetti, MD;  Location: [facility];  Service: Orthopedics;  Laterality: Right;    Review of Systems  Constitutional:  Positive for fatigue and fever. Negative for appetite change, chills, diaphoresis and unexpected weight change.  Eyes:        Negative for acute change in vision  Respiratory:  Negative for chest tightness, shortness of breath and wheezing.   Cardiovascular:  Negative for chest pain.  Gastrointestinal:  Negative for diarrhea, nausea and vomiting.  Genitourinary:  Negative for dysuria, pelvic pain and vaginal discharge.  Musculoskeletal:  Positive for neck stiffness. Negative for neck pain.  Skin:  Negative for rash.  Neurological:  Positive for headaches. Negative for seizures, syncope and weakness.  Hematological:  Negative for adenopathy. Does not bruise/bleed easily.  Psychiatric/Behavioral:  Negative for hallucinations.      Objective:    BP 117/81   Pulse 83   Temp 98.7 F (37.1 C) (Oral)   Wt 173 lb (78.5 kg)   LMP 05/28/2021 (Approximate)   SpO2 100%   BMI 29.70 kg/m  Nursing note and vital signs reviewed.  Physical Exam Constitutional:      General: She is not in acute distress.    Appearance: She is well-developed.  Eyes:     Conjunctiva/sclera:  Conjunctivae normal.  Cardiovascular:     Rate and Rhythm: Normal rate and regular rhythm.     Heart sounds: Normal heart sounds. No murmur heard.   No friction rub. No gallop.  Pulmonary:     Effort: Pulmonary effort is normal. No respiratory distress.     Breath sounds: Normal breath sounds. No wheezing or rales.  Chest:     Chest wall: No tenderness.  Abdominal:     General: Bowel sounds are normal.     Palpations: Abdomen is soft.     Tenderness: There is no abdominal tenderness.  Musculoskeletal:     Cervical back: Neck supple.  Lymphadenopathy:     Cervical: No cervical adenopathy.  Skin:    General: Skin is warm and dry.     Findings: No rash.  Neurological:     Mental Status: She is alert and oriented to person, place, and time.  Psychiatric:  Behavior: Behavior normal.        Thought Content: Thought content normal.        Judgment: Judgment normal.     Depression screen [facility] 2/9 08/27/2021 06/04/2021 04/24/2021 02/27/2021 11/20/2020  Decreased Interest 1 0 0 0 0  Down, Depressed, Hopeless 2 0 0 0 0  PHQ - 2 Score 3 0 0 0 0  Altered sleeping 2 - - - -  Tired, decreased energy 2 - - - -  Change in appetite 1 - - - -  Feeling bad or failure about yourself  1 - - - -  Trouble concentrating 0 - - - -  Moving slowly or fidgety/restless 0 - - - -  Suicidal thoughts 0 - - - -  PHQ-9 Score 9 - - - -  Difficult doing work/chores Very difficult - - - -  Some recent data might be hidden       Assessment & Plan:    Patient Active Problem List   Diagnosis Date Noted   Tubo-ovarian abscess 04/25/2021   TOA (tubo-ovarian abscess) 04/15/2021   Cervical dysplasia, mild 12/06/2020   Visit for routine gyn exam 11/01/2020   STD exposure 11/01/2020   S/P craniotomy 05/16/2020   Brain tumor ([place]) 05/16/2020   Headache due to intracranial disease 05/09/2020   Hypertension    Pain of upper abdomen    Suicide ideation    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[place])    Seizure ([place])    Intracranial mass    Intractable headache 02/04/2020   Encephalitis, myelitis, 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[place]) 01/31/2020   Rotator cuff strain 01/26/2020   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   AIDS (acquired immune deficiency syndrome) ([place]) 11/07/2019   Cerebral edema (HCC) 10/28/2019   Midline shift of brain    Pruritus 08/29/2019   Tendinopathy of left shoulder 01/18/2019   Chronic pelvic pain in female 01/04/2019   Lower abdominal pain 06/21/2018   Syphilis 02/26/2018   Tuberculosis    Infertility, female 02/12/2018   ASCUS with positive high risk HPV cervical 09/14/2017   Acute right-sided low back pain with right-sided sciatica 08/24/2017   Complex regional pain syndrome 02/03/2017   Headache 10/28/2016   Avascular necrosis of bone of right hip ([place]) 04/04/2016   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  Obtain MRI.  Restart medications.  Further treatment as indicated pending MRI results.      Relevant Orders   MR BRAIN W WO CONTRAST     Other   AIDS (acquired immune deficiency syndrome) ([place]) - Primary    Kelsey Rodriguez continues to have poorly controlled virus with less than optimal adherence to her ART regimen of Biktarvy and atovaquone.  She has been off medications for at least a month thus far secondary to "stress".   Discussed importance of taking medications daily.  Concern for possible worsening of her toxoplasmosis given new onset headaches.  We will also check cryptococcal antigen.  Restart Biktarvy and atovaquone.  Check viral load and CD4 count today.  Plan for follow-up in 1 month or sooner if needed.      Relevant Orders   COMPLETE METABOLIC PANEL WITH GFR   T-helper cell (CD4)- ([facility] clinic only)   HIV-1 RNA ultraquant reflex to gentyp+   Cryptococcal Ag, Ltx Scr Rflx Titer   MR BRAIN W WO CONTRAST   Other Visit Diagnoses     Elevated temperature            I am having Kelsey Rodriguez maintain her DULoxetine, bictegravir-emtricitabine-tenofovir AF, oxyCODONE, ibuprofen, clindamycin, oxyCODONE-acetaminophen, atovaquone, and Acetaminophen (TYLENOL PO).   Follow-up: No follow-ups on file.   Terri Piedra, MSN, FNP-C Nurse Practitioner [facility] [place] number: [phone]

## 2021-08-27 NOTE — Assessment & Plan Note (Signed)
[name] continues to have poorly controlled virus with less than optimal adherence to her ART regimen of Biktarvy and atovaquone.  She has been off medications for at least a month thus far secondary to "stress".  Discussed importance of taking medications daily.  Concern for possible worsening of her toxoplasmosis given new onset headaches.  We will also check cryptococcal antigen.  Restart Biktarvy and atovaquone.  Check viral load and CD4 count today.  Plan for follow-up in 1 month or sooner if needed.

## 2021-08-28 ENCOUNTER — Ambulatory Visit (INDEPENDENT_AMBULATORY_CARE_PROVIDER_SITE_OTHER): Payer: Medicare Other | Admitting: Orthopaedic Surgery

## 2021-08-28 ENCOUNTER — Other Ambulatory Visit (HOSPITAL_COMMUNITY): Payer: Self-pay

## 2021-08-28 ENCOUNTER — Ambulatory Visit: Payer: Self-pay

## 2021-08-28 ENCOUNTER — Encounter: Payer: Self-pay | Admitting: Orthopaedic Surgery

## 2021-08-28 DIAGNOSIS — M7061 Trochanteric bursitis, right hip: Secondary | ICD-10-CM

## 2021-08-28 DIAGNOSIS — M7062 Trochanteric bursitis, left hip: Secondary | ICD-10-CM

## 2021-08-28 DIAGNOSIS — M5442 Lumbago with sciatica, left side: Secondary | ICD-10-CM

## 2021-08-28 LAB — T-HELPER CELL (CD4) - (RCID CLINIC ONLY)
CD4 % Helper T Cell: 6 % — ABNORMAL LOW (ref 33–65)
CD4 T Cell Abs: 51 /uL — ABNORMAL LOW (ref 400–1790)

## 2021-08-28 NOTE — Addendum Note (Signed)
Addended by: Robyne Peers on: 08/28/2021 02:08 PM   Modules accepted: Orders
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
## 2021-08-31 ENCOUNTER — Other Ambulatory Visit: Payer: Self-pay

## 2021-08-31 ENCOUNTER — Ambulatory Visit: Payer: Medicare Other | Attending: Physician Assistant | Admitting: Physical Therapy

## 2021-08-31 ENCOUNTER — Encounter: Payer: Self-pay | Admitting: Physical Therapy

## 2021-08-31 DIAGNOSIS — M5441 Lumbago with sciatica, right side: Secondary | ICD-10-CM | POA: Diagnosis not present

## 2021-08-31 DIAGNOSIS — M25551 Pain in right hip: Secondary | ICD-10-CM | POA: Insufficient documentation

## 2021-08-31 DIAGNOSIS — M25552 Pain in left hip: Secondary | ICD-10-CM | POA: Insufficient documentation
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
## 2021-09-06 LAB — COMPLETE METABOLIC PANEL WITH GFR
AG Ratio: 1.3 (calc) (ref 1.0–2.5)
ALT: 9 U/L (ref 6–29)
AST: 17 U/L (ref 10–30)
Albumin: 4.7 g/dL (ref 3.6–5.1)
Alkaline phosphatase (APISO): 67 U/L (ref 31–125)
BUN: 9 mg/dL (ref 7–25)
CO2: 26 mmol/L (ref 20–32)
Calcium: 9.6 mg/dL (ref 8.6–10.2)
Chloride: 104 mmol/L (ref 98–110)
Creat: 0.7 mg/dL (ref 0.50–0.97)
Globulin: 3.5 g/dL (calc) (ref 1.9–3.7)
Glucose, Bld: 80 mg/dL (ref 65–99)
Potassium: 3.9 mmol/L (ref 3.5–5.3)
Sodium: 138 mmol/L (ref 135–146)
Total Bilirubin: 0.4 mg/dL (ref 0.2–1.2)
Total Protein: 8.2 g/dL — ABNORMAL HIGH (ref 6.1–8.1)
eGFR: 118 mL/min/{1.73_m2} (ref >=60)

## 2021-09-06 LAB — HIV-1 RNA ULTRAQUANT REFLEX TO GENTYP+
HIV 1 RNA Quant: 374000 copies/mL — ABNORMAL HIGH
HIV-1 RNA Quant, Log: 5.57 Log copies/mL — ABNORMAL HIGH

## 2021-09-06 LAB — CRYPTOCOCCAL AG, LTX SCR RFLX TITER
Cryptococcal Ag Screen: NOT DETECTED
MICRO NUMBER:: 12369472
SPECIMEN QUALITY:: ADEQUATE

## 2021-09-06 LAB — HIV-1 GENOTYPE: HIV-1 Genotype: DETECTED — AB

## 2021-09-10 ENCOUNTER — Ambulatory Visit: Payer: Medicare Other | Admitting: Physical Therapy

## 2021-09-10 ENCOUNTER — Other Ambulatory Visit: Payer: Self-pay

## 2021-09-10 ENCOUNTER — Encounter: Payer: Self-pay | Admitting: Physical Therapy

## 2021-09-10 DIAGNOSIS — M25551 Pain in right hip: Secondary | ICD-10-CM

## 2021-09-10 DIAGNOSIS — M25552 Pain in left hip: Secondary | ICD-10-CM

## 2021-09-10 DIAGNOSIS — M5441 Lumbago with sciatica, right side: Secondary | ICD-10-CM

## 2021-09-10 NOTE — Therapy (Addendum)
[place], [place], [address] Phone: [phone]   Fax:  [phone]  Physical Therapy Treatment  Patient Details  Name: Kelsey Rodriguez MRN: [id number] Date of Birth: Mar 06, 1991 Referring Provider (PT): Erskine Emery, PA-C   Encounter Date: 09/10/2021   PT End of Session - 09/10/21 0828     Visit Number 2    Number of Visits 12    Date for PT Re-Evaluation 10/12/21    Authorization Type BCBS Medicare with [facility] MCD secondary, progress note by visit 10 and recheck FOTO visits 6 and 10    PT Start Time 0827   Pt arrived 27 min late   PT Stop Time 0845    PT Time Calculation (min) 18 min    Activity Tolerance Patient limited by pain    Behavior During Therapy [facility] for tasks assessed/performed             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   Adrenal insufficiency ([place])    Anemia of chronic disease 03/11/2012   Back pain of lumbar region with sciatica 02/12/2015   Bell's palsy 08/26/2013   Brain lesion    Bullae 05/30/2012   Chronic back pain    Chronic leg pain    bilateral knees, ankles   Depression    Fatigue    GERD (gastroesophageal reflux disease)    Headache    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   HIV (human immunodeficiency virus infection) ([place]) 02/2012   Laceration of ankle, right 11/18/2012   Lumbar radiculopathy    Meningitis 02/18/2018   Pelvic pain    PID (acute pelvic inflammatory disease) 02/26/2018   Pneumonia    Reflux esophagitis 03/11/2012   Seizure ([place])    Tuberculosis    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   Vertigo    Wears glasses     Past Surgical History:  Procedure Laterality Date   APPENDECTOMY  ~ 5732   APPLICATION OF CRANIAL NAVIGATION N/A 05/16/2020   Procedure: APPLICATION OF CRANIAL NAVIGATION;  Surgeon: Newman Pies, MD;  Location: [place];  Service: Neurosurgery;  Laterality: N/A;    CRANIOTOMY Right 05/16/2020   Procedure: Craniotomy for Resection of Lesion;  Surgeon: Newman Pies, MD;  Location: [place];  Service: Neurosurgery;  Laterality: Right;  right   DILATION AND CURETTAGE OF UTERUS  2008   ESOPHAGOGASTRODUODENOSCOPY  03/11/2012   Procedure: ESOPHAGOGASTRODUODENOSCOPY (EGD);  Surgeon: Lafayette Dragon, MD;  [facility] ENDOSCOPY;  Service: Endoscopy;  Laterality: N/A;   ESOPHAGOGASTRODUODENOSCOPY N/A 03/07/2014   Procedure: ESOPHAGOGASTRODUODENOSCOPY (EGD);  Surgeon: Gatha Mayer, MD;  Location: [facility] ENDOSCOPY;  Service: Endoscopy;  Laterality: N/A;   LUNG BIOPSY  02/2012   TOTAL HIP ARTHROPLASTY Left 12/14/2015   Procedure: LEFT TOTAL HIP ARTHROPLASTY ANTERIOR APPROACH;  Surgeon: Mcarthur Rossetti, MD;  [facility];  Service: Orthopedics;  Laterality: Left;   TOTAL HIP ARTHROPLASTY Right 04/04/2016   Procedure: RIGHT TOTAL HIP ARTHROPLASTY ANTERIOR APPROACH;  Surgeon: Mcarthur Rossetti, MD;  [facility];  Service: Orthopedics;  Laterality: Right;    There were no vitals filed for this visit.   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, left side 2016), CRPS, chronic LBP, benign brain tumor s/p craniotomy 2021, seizures,  AIDS, CNS toxoplasmosis, depression    Limitations Walking;Standing;Lifting;House hold activities;Sitting                               [facility] Adult PT Treatment/Exercise - 09/10/21 0001       Lumbar Exercises: Stretches   Other Lumbar Stretch Exercise childs pose exercise forward and  side stretch 4 x 30 sec      Manual Therapy   Manual Therapy Soft tissue mobilization;Myofascial release    Manual therapy comments skilled Palpation iwth TPDN    Soft tissue mobilization R QL, R gluteals, LAD    Myofascial Release R QL              Trigger Point Dry  Needling - 09/10/21 0001     Consent Given? Yes    Education Handout Provided Yes    Muscles Treated Back/Hip Quadratus lumborum;Gluteus medius   R only   Dry Needling Comments 75 mm .30 gage    Gluteus Medius Response Twitch response elicited;Palpable increased muscle length    Quadratus Lumborum Response Twitch response elicited;Palpable increased muscle length                   PT Education - 09/10/21 0848     Education Details Pt educated on TPDN and recieved only TPDN due to pt 27 minutes late to appt    Person(s) Educated Patient    Methods Explanation;Handout    Comprehension Verbalized understanding;Returned demonstration              PT Short Term Goals - 08/31/21 1250       PT SHORT TERM GOAL #1   Title Independent with HEP    Baseline needs HEP    Time 3    Period Weeks    Status New    Target Date 09/21/21      PT SHORT TERM GOAL #2   Title Increase right hip flexion AROM at least 10 deg or greater to improve ability for car transfers    Baseline 70 deg    Time 3    Period Weeks    Status New    Target Date 09/21/21      PT SHORT TERM GOAL #3   Title Centralize RLE radiating parasthesias and pain proximally to at least knee region for improved ADL tolerance without leg pain    Baseline symptoms intermittently distal to foot    Time 3    Period Weeks    Status New    Target Date 09/21/21               PT Long Term Goals - 08/31/21 1251       PT LONG TERM GOAL #1   Title Improve FOTO outcome measure score to 75% or greater functional status    Baseline 59%    Time 6    Period Weeks    Status New    Target Date 10/12/21      PT LONG TERM GOAL #2   Title Pt. will have decreased sleep disturbance symptoms due to RLE pain by at least 50% from baseline and be able to tolerate sleep in right sidelying unable    Baseline difficulty tolerating due to sleep disturbance    Time 6    Period Weeks    Status New    Target Date 10/12/21       PT LONG TERM GOAL #3  Title Pt. will increase bilat. hip abduction and extension strength at least grossly 1/2 MMT grade to help decrease lateral hip pain symptoms with walking and for decreased lumbar strain with lifting activities for chores    Baseline see objective    Time 6    Period Weeks    Status New    Target Date 10/12/21      PT LONG TERM GOAL #4   Title Pt. will be independent with advanced HEP for continued progression after d/c from formal therapy    Time 6    Period Weeks    Status New    Target Date 10/12/21                   Plan - 09/10/21 0846     Clinical Impression Statement Pt arrives 27 minutes late but has 5/10 pain and consents to TPDN.  Pt has marked twitch response and has decreased muscle tension post TPDN.  Pt was closely monitored throughout session.  Ends with childs pose stretch and reports decrease in pain.  will continue next visit with review of exercise and FOTO report    Personal Factors and Comorbidities Comorbidity 3+    Comorbidities chronic pain history, CRPS, AIDS, depression, bilateral THA, see PMH    Examination-Activity Limitations Sleep;Lift;Squat;Bend;Locomotion Level    Examination-Participation Restrictions Art gallery manager;Shop    Clinical Impairments Affecting Rehab Potential complicating factors from PMH, chronic pain history    PT Frequency 2x / week    PT Duration 6 weeks    PT Treatment/Interventions Electrical Stimulation;Moist Heat;Neuromuscular re-education;Patient/family education;Therapeutic activities;Therapeutic exercise;Dry needling;Manual techniques;Passive range of motion;Functional mobility training;Ultrasound;ADLs/Self Care Home Management;Cryotherapy;Iontophoresis 4mg /ml Dexamethasone;Gait training;Traction;Taping;Spinal Manipulations    PT Next Visit Plan review FOTO pt. report by visit 3 check response to trunk extension ROM for centralization of RLE symptoms, NUSTEP warm up for hip ROM, work  on hip ROM and gentle stretches as tolerated, STM vs. potential trial dry needling to gluteus medius and QL region bilaterally, continue/progress lumbar ROM as tolerated (mild extension bias at eval) and progress to lumbar and core stabilization exercises as tolerated, modalities prn to lateral hip region and progress glut med strengthening pending pain.  Pt late last visit and recieved only TPDN    PT Home Exercise Plan access code: [email or web address]    Consulted and Agree with Plan of Care Patient             Patient will benefit from skilled therapeutic intervention in order to improve the following deficits and impairments:  Pain, Impaired flexibility, Decreased strength, Decreased activity tolerance, Decreased range of motion, Hypomobility, Difficulty walking, Impaired sensation, Increased muscle spasms  Visit Diagnosis: Acute bilateral low back pain with right-sided sciatica  Pain in right hip  Pain in left hip     Problem List Patient Active Problem List   Diagnosis Date Noted   Tubo-ovarian abscess 04/25/2021   TOA (tubo-ovarian abscess) 04/15/2021   Cervical dysplasia, mild 12/06/2020   Visit for routine gyn exam 11/01/2020   STD exposure 11/01/2020   S/P craniotomy 05/16/2020   Brain tumor ([place]) 05/16/2020   Headache due to intracranial disease 05/09/2020   Hypertension    Pain of upper abdomen    Suicide ideation    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[place])    Seizure ([place])    Intracranial mass    Intractable headache 02/04/2020   Encephalitis, myelitis, 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[place]) 01/31/2020   Rotator cuff strain 01/26/2020  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   AIDS (acquired immune deficiency syndrome) ([place]) 11/07/2019   Cerebral edema ([place]) 10/28/2019   Midline shift of brain    Pruritus 08/29/2019   Tendinopathy of left shoulder 01/18/2019   Chronic pelvic pain in female 01/04/2019   Lower abdominal pain 06/21/2018    Syphilis 02/26/2018   Tuberculosis    Infertility, female 02/12/2018   ASCUS with positive high risk HPV cervical 09/14/2017   Acute right-sided low back pain with right-sided sciatica 08/24/2017   Complex regional pain syndrome 02/03/2017   Headache 10/28/2016   Avascular necrosis of bone of right hip ([place]) 04/04/2016   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   Avascular necrosis of bone of left hip ([place]) 12/14/2015   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   Nausea 05/18/2015   Vertigo 01/23/2015   Primary adrenal insufficiency ([place]) 01/03/2015   Chest pain 07/07/2013   HIV (human immunodeficiency virus infection) ([place]) 03/16/2012   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

## 2021-09-10 NOTE — Patient Instructions (Signed)

## 2021-09-12 ENCOUNTER — Ambulatory Visit: Payer: Medicare Other

## 2021-09-12 ENCOUNTER — Other Ambulatory Visit: Payer: Self-pay

## 2021-09-12 DIAGNOSIS — M5441 Lumbago with sciatica, right side: Secondary | ICD-10-CM | POA: Diagnosis not present

## 2021-09-12 DIAGNOSIS — M25552 Pain in left hip: Secondary | ICD-10-CM

## 2021-09-12 DIAGNOSIS — M25551 Pain in right hip: Secondary | ICD-10-CM

## 2021-09-12 DIAGNOSIS — B20 Human immunodeficiency virus [HIV] disease: Secondary | ICD-10-CM

## 2021-09-12 DIAGNOSIS — Z79899 Other long term (current) drug therapy: Secondary | ICD-10-CM

## 2021-09-12 DIAGNOSIS — Z113 Encounter for screening for infections with a predominantly sexual mode of transmission: Secondary | ICD-10-CM

## 2021-09-12 NOTE — Addendum Note (Signed)
Addended by: Lucie Leather D on: 09/12/2021 09:56 AM   Modules accepted: Orders

## 2021-09-12 NOTE — Patient Instructions (Signed)
  [id number]

## 2021-09-12 NOTE — Therapy (Signed)
[place], [place], [address] Phone: [phone]   Fax:  [phone]  Physical Therapy Treatment  Patient Details  Name: Kelsey Rodriguez MRN: [id number] Date of Birth: 1991-01-03 Referring Provider (PT): Erskine Emery, PA-C   Encounter Date: 09/12/2021   PT End of Session - 09/12/21 1859     Visit Number 3    Number of Visits 12    Date for PT Re-Evaluation 10/12/21    Authorization Type BCBS Medicare with [facility] MCD secondary, progress note by visit 10 and recheck FOTO visits 6 and 10    PT Start Time 1830    PT Stop Time 1915    PT Time Calculation (min) 45 min    Activity Tolerance Patient limited by pain;Patient tolerated treatment well    Behavior During Therapy [facility] for tasks assessed/performed             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   HIV (human immunodeficiency virus infection) ([place]) 02/2012   Laceration of ankle, right 11/18/2012   Lumbar radiculopathy    Meningitis 02/18/2018   Pelvic pain    PID (acute pelvic inflammatory disease) 02/26/2018   Pneumonia    Reflux esophagitis 03/11/2012   Seizure ([place])    Tuberculosis    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   Vertigo    Wears glasses     Past Surgical History:  Procedure Laterality Date   APPENDECTOMY  ~ 1025   APPLICATION OF CRANIAL NAVIGATION N/A 05/16/2020   Procedure: APPLICATION OF CRANIAL NAVIGATION;  Surgeon: Newman Pies, MD;  Location: [place];  Service: Neurosurgery;  Laterality: N/A;    CRANIOTOMY Right 05/16/2020   Procedure: Craniotomy for Resection of Lesion;  Surgeon: Newman Pies, MD;  Location: [place];  Service: Neurosurgery;  Laterality: Right;  right   DILATION AND CURETTAGE OF UTERUS  2008   ESOPHAGOGASTRODUODENOSCOPY  03/11/2012   Procedure: ESOPHAGOGASTRODUODENOSCOPY (EGD);  Surgeon: Lafayette Dragon, MD;  Location: [facility] ENDOSCOPY;  Service: Endoscopy;  Laterality: N/A;   ESOPHAGOGASTRODUODENOSCOPY N/A 03/07/2014   Procedure: ESOPHAGOGASTRODUODENOSCOPY (EGD);  Surgeon: Gatha Mayer, MD;  Location: [facility] ENDOSCOPY;  Service: Endoscopy;  Laterality: N/A;   LUNG BIOPSY  02/2012   TOTAL HIP ARTHROPLASTY Left 12/14/2015   Procedure: LEFT TOTAL HIP ARTHROPLASTY ANTERIOR APPROACH;  Surgeon: Mcarthur Rossetti, MD;  [facility];  Service: Orthopedics;  Laterality: Left;   TOTAL HIP ARTHROPLASTY Right 04/04/2016   Procedure: RIGHT TOTAL HIP ARTHROPLASTY ANTERIOR APPROACH;  Surgeon: Mcarthur Rossetti, MD;  [facility];  Service: Orthopedics;  Laterality: Right;    There were no vitals filed for this visit.   Subjective Assessment - 09/12/21 1834     Subjective Pt reports a positive response to dry needling. She reports varied adherence to her HEP, performing her exercises every couple of days.    Currently in Pain? Yes    Pain Score 0-No pain  [facility] PT Assessment - 09/12/21 0001       Observation/Other Assessments   Observations SIJ directional preference identified in R innominate counter-nutation/ L innominate nutation                           [facility] Adult PT Treatment/Exercise - 09/12/21 0001       Lumbar Exercises: Standing   Lifting Limitations hip flexion 2x10 BIL with 3sec lowering    Other Standing Lumbar Exercises Hip abduction 2x10 BIL with 3-sec lowering    Other Standing Lumbar Exercises Hip extension 2x10 BIL with 3sec lowering      Lumbar Exercises: Supine   Other Supine Lumbar Exercises  Alternating lew lowering with feet off table 2x10 BIL                     PT Education - 09/12/21 1858     Education Details Educated on potential underlying SIJ pathophysiology which may be contributing to her pain presentation. Updated HEP.    Person(s) Educated Patient    Methods Explanation;Demonstration;Handout    Comprehension Verbalized understanding;Returned demonstration              PT Short Term Goals - 08/31/21 1250       PT SHORT TERM GOAL #1   Title Independent with HEP    Baseline needs HEP    Time 3    Period Weeks    Status New    Target Date 09/21/21      PT SHORT TERM GOAL #2   Title Increase right hip flexion AROM at least 10 deg or greater to improve ability for car transfers    Baseline 70 deg    Time 3    Period Weeks    Status New    Target Date 09/21/21      PT SHORT TERM GOAL #3   Title Centralize RLE radiating parasthesias and pain proximally to at least knee region for improved ADL tolerance without leg pain    Baseline symptoms intermittently distal to foot    Time 3    Period Weeks    Status New    Target Date 09/21/21               PT Long Term Goals - 08/31/21 1251       PT LONG TERM GOAL #1   Title Improve FOTO outcome measure score to 75% or greater functional status    Baseline 59%    Time 6    Period Weeks    Status New    Target Date 10/12/21      PT LONG TERM GOAL #2   Title Pt. will have decreased sleep disturbance symptoms due to RLE pain by at least 50% from baseline and be able to tolerate sleep in right sidelying unable    Baseline difficulty tolerating due to sleep disturbance    Time 6    Period Weeks    Status New    Target Date 10/12/21      PT LONG TERM GOAL #3   Title Pt. will increase bilat. hip abduction and extension strength at least grossly 1/2 MMT grade to help decrease lateral hip pain symptoms with walking and for decreased lumbar strain with lifting activities for chores     Baseline see objective    Time 6    Period Weeks    Status New    Target Date 10/12/21  PT LONG TERM GOAL #4   Title Pt. will be independent with advanced HEP for continued progression after d/c from formal therapy    Time 6    Period Weeks    Status New    Target Date 10/12/21                   Plan - 09/12/21 1900     Clinical Impression Statement Upon assessment of SIJ directional preference, the pt exhibits preference into R innominate counter-nutation as indicated by standing lunge with foot on table and with modified Thomas test positioning. Additionally, the pt responded well to newly added exercises, stating that she can feel the muscle fatigue without notable increase in pain. She will continue to benefit from skilled PT to address her primary impairments and return to her prior level of function with less impairment.    Personal Factors and Comorbidities Comorbidity 3+    Comorbidities chronic pain history, CRPS, AIDS, depression, bilateral THA, see PMH    Examination-Activity Limitations Sleep;Lift;Squat;Bend;Locomotion Level    Examination-Participation Restrictions Art gallery manager;Shop    Stability/Clinical Decision Making Evolving/Moderate complexity    Clinical Decision Making Moderate    Rehab Potential Fair    Clinical Impairments Affecting Rehab Potential complicating factors from PMH, chronic pain history    PT Frequency 2x / week    PT Duration 6 weeks    PT Treatment/Interventions Electrical Stimulation;Moist Heat;Neuromuscular re-education;Patient/family education;Therapeutic activities;Therapeutic exercise;Dry needling;Manual techniques;Passive range of motion;Functional mobility training;Ultrasound;ADLs/Self Care Home Management;Cryotherapy;Iontophoresis 4mg /ml Dexamethasone;Gait training;Traction;Taping;Spinal Manipulations    PT Next Visit Plan review FOTO pt. report by visit 3 check response to trunk extension ROM for centralization of  RLE symptoms, NUSTEP warm up for hip ROM, work on hip ROM and gentle stretches as tolerated, STM vs. potential trial dry needling to gluteus medius and QL region bilaterally, continue/progress lumbar ROM as tolerated (mild extension bias at eval) and progress to lumbar and core stabilization exercises as tolerated, modalities prn to lateral hip region and progress glut med strengthening pending pain.  Pt late last visit and recieved only TPDN    PT Home Exercise Plan access code: [email or web address]    Consulted and Agree with Plan of Care Patient             Patient will benefit from skilled therapeutic intervention in order to improve the following deficits and impairments:  Pain, Impaired flexibility, Decreased strength, Decreased activity tolerance, Decreased range of motion, Hypomobility, Difficulty walking, Impaired sensation, Increased muscle spasms  Visit Diagnosis: Acute bilateral low back pain with right-sided sciatica  Pain in right hip  Pain in left hip     Problem List Patient Active Problem List   Diagnosis Date Noted   Tubo-ovarian abscess 04/25/2021   TOA (tubo-ovarian abscess) 04/15/2021   Cervical dysplasia, mild 12/06/2020   Visit for routine gyn exam 11/01/2020   STD exposure 11/01/2020   S/P craniotomy 05/16/2020   Brain tumor ([place]) 05/16/2020   Headache due to intracranial disease 05/09/2020   Hypertension    Pain of upper abdomen    Suicide ideation    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[place])    Seizure ([place])    Intracranial mass    Intractable headache 02/04/2020   Encephalitis, myelitis, 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[place]) 01/31/2020   Rotator cuff strain 01/26/2020   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   AIDS (acquired immune deficiency syndrome) ([place]) 11/07/2019   Cerebral edema ([place]) 10/28/2019   Midline shift of brain  Pruritus 08/29/2019   Tendinopathy of left shoulder 01/18/2019   Chronic pelvic pain in female  01/04/2019   Lower abdominal pain 06/21/2018   Syphilis 02/26/2018   Tuberculosis    Infertility, female 02/12/2018   ASCUS with positive high risk HPV cervical 09/14/2017   Acute right-sided low back pain with right-sided sciatica 08/24/2017   Complex regional pain syndrome 02/03/2017   Headache 10/28/2016   Avascular necrosis of bone of right hip ([place]) 04/04/2016   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   Avascular necrosis of bone of left hip ([place]) 12/14/2015   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   Nausea 05/18/2015   Vertigo 01/23/2015   Primary adrenal insufficiency ([place]) 01/03/2015   Chest pain 07/07/2013   HIV (human immunodeficiency virus infection) ([place]) 03/16/2012   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 [place], [place], [address] Phone: [phone]   Fax:  [phone]  Name: Kelsey Rodriguez MRN: [id number] Date of Birth: [birth year]

## 2021-09-14 ENCOUNTER — Other Ambulatory Visit: Payer: Self-pay

## 2021-09-14 ENCOUNTER — Ambulatory Visit
Admission: RE | Admit: 2021-09-14 | Discharge: 2021-09-14 | Disposition: A | Discharge status: Home / Self Care | Payer: Medicare Other | Source: Ambulatory Visit | Attending: Family | Admitting: Family

## 2021-09-14 DIAGNOSIS — B20 Human immunodeficiency virus [HIV] disease: Secondary | ICD-10-CM

## 2021-09-14 DIAGNOSIS — B582 Toxoplasma meningoencephalitis: Secondary | ICD-10-CM

## 2021-09-14 MED ORDER — GADOBENATE DIMEGLUMINE 529 MG/ML IV SOLN
15.0000 mL | Freq: Once | INTRAVENOUS | Status: AC | PRN
  Administered 2021-09-14: 15 mL via INTRAVENOUS

## 2021-09-15 ENCOUNTER — Emergency Department (HOSPITAL_COMMUNITY)
Admission: EM | Admit: 2021-09-15 | Discharge: 2021-09-15 | Disposition: A | Discharge status: Home / Self Care | Payer: Medicare Other | Attending: Emergency Medicine | Admitting: Emergency Medicine

## 2021-09-15 ENCOUNTER — Encounter (HOSPITAL_COMMUNITY): Payer: Self-pay

## 2021-09-15 DIAGNOSIS — Z96643 Presence of artificial hip joint, bilateral: Secondary | ICD-10-CM | POA: Diagnosis not present

## 2021-09-15 DIAGNOSIS — H538 Other visual disturbances: Secondary | ICD-10-CM | POA: Insufficient documentation

## 2021-09-15 DIAGNOSIS — H9312 Tinnitus, left ear: Secondary | ICD-10-CM | POA: Insufficient documentation

## 2021-09-15 DIAGNOSIS — R519 Headache, unspecified: Secondary | ICD-10-CM | POA: Insufficient documentation

## 2021-09-15 DIAGNOSIS — I10 Essential (primary) hypertension: Secondary | ICD-10-CM | POA: Diagnosis not present

## 2021-09-15 DIAGNOSIS — Z21 Asymptomatic human immunodeficiency virus [HIV] infection status: Secondary | ICD-10-CM | POA: Insufficient documentation

## 2021-09-15 MED ORDER — ONDANSETRON 4 MG PO TBDP
4.0000 mg | ORAL_TABLET | Freq: Once | ORAL | Status: AC
  Administered 2021-09-15: 4 mg via ORAL
  Filled 2021-09-15: qty 1

## 2021-09-15 MED ORDER — SODIUM CHLORIDE 0.9 % IV BOLUS
1000.0000 mL | Freq: Once | INTRAVENOUS | Status: AC
  Administered 2021-09-15: 1000 mL via INTRAVENOUS

## 2021-09-15 MED ORDER — METOCLOPRAMIDE HCL 5 MG/ML IJ SOLN
10.0000 mg | Freq: Once | INTRAMUSCULAR | Status: AC
  Administered 2021-09-15: 10 mg via INTRAVENOUS
  Filled 2021-09-15: qty 2

## 2021-09-15 MED ORDER — KETOROLAC TROMETHAMINE 15 MG/ML IJ SOLN
15.0000 mg | Freq: Once | INTRAMUSCULAR | Status: AC
  Administered 2021-09-15: 15 mg via INTRAVENOUS
  Filled 2021-09-15: qty 1

## 2021-09-15 MED ORDER — DIPHENHYDRAMINE HCL 50 MG/ML IJ SOLN
25.0000 mg | Freq: Once | INTRAMUSCULAR | Status: AC
  Administered 2021-09-15: 25 mg via INTRAVENOUS
  Filled 2021-09-15: qty 1

## 2021-09-15 NOTE — ED Provider Notes (Signed)
[facility] EMERGENCY DEPARTMENT Provider Note   CSN: [id number] Arrival date & time: 09/15/21  4098     History Chief Complaint  Patient presents with   Headache    Kelsey Rodriguez is a 30 y.o. female.  Patient with history of HIV/AIDS, poor compliance with HIV medications, Known toxoplasmosis lesions of brain --presents to the emergency department evaluation for ongoing headaches.  She was seen at her infectious disease clinic for same on September 13.  She had lab work showing CD4 51 and MRI brain scheduled/performed yesterday.  This showed improvement in size of toxoplasmosis lesions compared to previous without edema.  Patient describes the headache as a "tension".  She has tenderness to the right side of her scalp and cannot lay on that side.  She also reports intermittent loud noises in her left ear.  She describes this as being in a room full of people or being next to train that is passing by.  She states that this makes it difficulty for her to sleep.  He reports fevers at times but has not had a fever in more than 2 weeks.  She uses ibuprofen at home, 800 mg, without improvement.  She reports blurry vision intermittently but no loss of vision.  No vomiting.  No weakness, numbness, or tingling in the arms or the legs.  History of craniotomy on the right side.  She has never seen an ENT.  Last CD4 count was in the 50s.  At her appointment in September, she was started on Biktarvy andAntiprotozoal medication for toxoplasmosis.      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   HIV (human immunodeficiency virus infection) ([place]) 02/2012   Laceration of ankle, right 11/18/2012   Lumbar radiculopathy    Meningitis 02/18/2018   Pelvic pain    PID (acute pelvic inflammatory disease) 02/26/2018   Pneumonia    Reflux esophagitis 03/11/2012   Seizure ([place])    Tuberculosis    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   Vertigo    Wears glasses     Patient Active Problem List   Diagnosis Date Noted   Tubo-ovarian abscess 04/25/2021   TOA (tubo-ovarian abscess) 04/15/2021   Cervical dysplasia, mild 12/06/2020   Visit for routine gyn exam 11/01/2020   STD exposure 11/01/2020   S/P craniotomy 05/16/2020   Brain tumor ([place]) 05/16/2020   Headache due to intracranial disease 05/09/2020   Hypertension    Pain of upper abdomen    Suicide ideation    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[place])    Seizure ([place])    Intracranial mass    Intractable headache 02/04/2020   Encephalitis, myelitis, 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[place]) 01/31/2020   Rotator cuff strain 01/26/2020   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   AIDS (acquired immune deficiency syndrome) ([place]) 11/07/2019   Cerebral edema ([place]) 10/28/2019   Midline shift of brain    Pruritus 08/29/2019   Tendinopathy of left shoulder 01/18/2019  Chronic pelvic pain in female 01/04/2019   Lower abdominal pain 06/21/2018   Syphilis 02/26/2018   Tuberculosis    Infertility, female 02/12/2018   ASCUS with positive high risk HPV cervical 09/14/2017   Acute right-sided low back pain with right-sided sciatica 08/24/2017   Complex regional pain syndrome 02/03/2017   Headache 10/28/2016   Avascular necrosis of bone of right hip ([place]) 04/04/2016   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   Avascular necrosis of bone of left hip ([place]) 12/14/2015   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   Nausea 05/18/2015   Vertigo 01/23/2015    Primary adrenal insufficiency ([place]) 01/03/2015   Chest pain 07/07/2013   HIV (human immunodeficiency virus infection) ([place]) 03/16/2012   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    Past Surgical History:  Procedure Laterality Date   APPENDECTOMY  ~ 3762   APPLICATION OF CRANIAL NAVIGATION N/A 05/16/2020   Procedure: APPLICATION OF CRANIAL NAVIGATION;  Surgeon: Newman Pies, MD;  Location: [facility];  Service: Neurosurgery;  Laterality: N/A;   CRANIOTOMY Right 05/16/2020   Procedure: Craniotomy for Resection of Lesion;  Surgeon: Newman Pies, MD;  Location: [facility];  Service: Neurosurgery;  Laterality: Right;  right   DILATION AND CURETTAGE OF UTERUS  2008   ESOPHAGOGASTRODUODENOSCOPY  03/11/2012   Procedure: ESOPHAGOGASTRODUODENOSCOPY (EGD);  Surgeon: Lafayette Dragon, MD;  Location: [facility] ENDOSCOPY;  Service: Endoscopy;  Laterality: N/A;   ESOPHAGOGASTRODUODENOSCOPY N/A 03/07/2014   Procedure: ESOPHAGOGASTRODUODENOSCOPY (EGD);  Surgeon: Gatha Mayer, MD;  Location[facility] ENDOSCOPY;  Service: Endoscopy;  Laterality: N/A;   LUNG BIOPSY  02/2012   TOTAL HIP ARTHROPLASTY Left 12/14/2015   Procedure: LEFT TOTAL HIP ARTHROPLASTY ANTERIOR APPROACH;  Surgeon: Mcarthur Rossetti, MD;  Location: [facility];  Service: Orthopedics;  Laterality: Left;   TOTAL HIP ARTHROPLASTY Right 04/04/2016   Procedure: RIGHT TOTAL HIP ARTHROPLASTY ANTERIOR APPROACH;  Surgeon: Mcarthur Rossetti, MD;  Location: [facility];  Service: Orthopedics;  Laterality: Right;     OB History     Gravida  1   Para  0   Term  0   Preterm  0   AB  1   Living  0      SAB  1   IAB  0   Ectopic  0   Multiple  0   Live Births              Family History  Problem Relation Age of Onset   Heart disease Father        Vague not clearly cardiac   Hypertension Mother     Social History   Tobacco Use   Smoking status: Never   Smokeless tobacco: Never  Vaping Use   Vaping Use: Never used  Substance Use  Topics   Alcohol use: Yes    Comment: socially   Drug use: No    Home Medications Prior to Admission medications   Medication Sig Start Date End Date Taking? Authorizing Provider  acetaminophen (TYLENOL) 500 MG tablet Take 500 mg by mouth every 6 (six) hours as needed for mild pain.   Yes [provider]  atovaquone (MEPRON) 750 MG/5ML suspension Take 10 mLs (1,500 mg total) by mouth 2 (two) times daily with a meal. 06/04/21 06/04/22 Yes Carlyle Basques, MD  bictegravir-emtricitabine-tenofovir AF (BIKTARVY) 50-200-25 MG TABS tablet TAKE 1 TABLET BY MOUTH DAILY. 04/19/21 04/19/22 Yes Anyanwu, Sallyanne Havers, MD  ibuprofen (ADVIL) 800 MG tablet Take  800 mg by mouth 3 (three) times daily as needed. 06/07/21  Yes [provider]  polyethylene glycol (MIRALAX / GLYCOLAX) 17 g packet Take 17 g by mouth daily.   Yes [provider]  Polyvinyl Alcohol-Povidone (REFRESH OP) Apply 1 drop to eye as needed (dry eyes).   Yes [provider]  clindamycin (CLEOCIN) 300 MG capsule Take 1 capsule (300 mg total) by mouth 3 (three) times daily. Patient not taking: No sig reported 04/28/21   Woodroe Mode, MD  ibuprofen (ADVIL) 600 MG tablet Take 1 tablet (600 mg total) by mouth every 6 (six) hours as needed (mild pain). Patient not taking: No sig reported 04/28/21   Woodroe Mode, MD  oxyCODONE-acetaminophen (PERCOCET/ROXICET) 5-325 MG tablet Take 1 tablet by mouth every 6 (six) hours as needed for moderate pain or severe pain. Patient not taking: No sig reported 04/28/21   Woodroe Mode, MD  SUMAtriptan (IMITREX) 50 MG tablet Take 1 tablet (50 mg total) by mouth every 2 (two) hours as needed for migraine (Maximum dose: 100 mg per dose; 200 mg per 24 hours). May repeat in 2 hours if headache persists or recurs. 10/26/19 01/31/20  Caroline More, DO    Allergies    Hydrocodone and Tramadol  Review of Systems   Review of Systems  Constitutional:  Negative for fever.  HENT:   Negative for congestion, dental problem, rhinorrhea and sinus pressure.        +tinnitus  Eyes:  Negative for photophobia, discharge, redness and visual disturbance.  Respiratory:  Negative for shortness of breath.   Cardiovascular:  Negative for chest pain.  Gastrointestinal:  Negative for nausea and vomiting.  Musculoskeletal:  Negative for gait problem, neck pain and neck stiffness.  Skin:  Negative for rash.  Neurological:  Positive for headaches. Negative for syncope, speech difficulty, weakness, light-headedness and numbness.  Psychiatric/Behavioral:  Negative for confusion.    Physical Exam Updated Vital Signs BP 116/87 (BP Location: Left Arm)   Pulse 73   Temp 98.8 F (37.1 C)   Resp (!) 27   LMP 05/28/2021 (Approximate)   SpO2 96%   Physical Exam Vitals and nursing note reviewed.  Constitutional:      Appearance: She is well-developed.  HENT:     Head: Normocephalic and atraumatic.     Comments: Tenderness to the R temporal scalp without lesions.     Right Ear: Ear canal and external ear normal. Tympanic membrane is bulging. Tympanic membrane is not erythematous.     Left Ear: Ear canal and external ear normal. Tympanic membrane is bulging. Tympanic membrane is not erythematous.     Nose: Nose normal.     Mouth/Throat:     Pharynx: Uvula midline.  Eyes:     General: Lids are normal.     Extraocular Movements:     Right eye: No nystagmus.     Left eye: No nystagmus.     Conjunctiva/sclera: Conjunctivae normal.     Pupils: Pupils are equal, round, and reactive to light.  Cardiovascular:     Rate and Rhythm: Normal rate and regular rhythm.  Pulmonary:     Effort: Pulmonary effort is normal.     Breath sounds: Normal breath sounds.  Abdominal:     Palpations: Abdomen is soft.     Tenderness: There is no abdominal tenderness.  Musculoskeletal:     Cervical back: Normal range of motion and neck supple. No tenderness or bony tenderness.  Skin:  General: Skin is  warm and dry.  Neurological:     Mental Status: She is alert and oriented to person, place, and time.     GCS: GCS eye subscore is 4. GCS verbal subscore is 5. GCS motor subscore is 6.     Cranial Nerves: No cranial nerve deficit.     Sensory: No sensory deficit.     Motor: No weakness.     Coordination: Coordination normal.     Gait: Gait normal.     Comments: Upper extremity myotomes tested bilaterally:  C5 Shoulder abduction 5/5 C6 Elbow flexion/wrist extension 5/5 C7 Elbow extension 5/5 C8 Finger flexion 5/5 T1 Finger abduction 5/5  Lower extremity myotomes tested bilaterally: L2 Hip flexion 5/5 L3 Knee extension 5/5 L4 Ankle dorsiflexion 5/5 S1 Ankle plantar flexion 5/5     ED Results / Procedures / Treatments   Labs (all labs ordered are listed, but only abnormal results are displayed) Labs Reviewed - No data to display  EKG None  Radiology MR BRAIN W WO CONTRAST  Result Date: 09/15/2021 CLINICAL DATA:  Toxoplasmosis.  Aids. EXAM: MRI HEAD WITHOUT AND WITH CONTRAST TECHNIQUE: Multiplanar, multiecho pulse sequences of the brain and surrounding structures were obtained without and with intravenous contrast. CONTRAST:  75mL MULTIHANCE GADOBENATE DIMEGLUMINE 529 MG/ML IV SOLN COMPARISON:  08/01/2020 FINDINGS: Brain: Ring-enhancing lesions in the left frontal white matter have decreased in size from prior, now 6 mm at the left frontal operculum (previously 7 mm) and 3 mm in the high left frontal lobe ( previously measured 7 mm). A left caudate head lesion is resolved. No worrisome enhancement at biopsy/resection site along the lateral right temporal lobe where there is encephalomalacia with volume loss. No infarct. Resolved cerebral edema associated with left-sided lesions. Stable encephalomalacia in the right occipital pole. No hydrocephalus or collection. Vascular: Normal flow voids and vascular enhancements Skull and upper cervical spine: Normal marrow signal Sinuses/Orbits:  Negative. IMPRESSION: 1. History of toxoplasmosis with decreased lesion size and resolved edema since 10/09/2020. 2. No acute or interval finding. Electronically Signed   By: Jorje Guild M.D.   On: 09/15/2021 05:33    Procedures Procedures   Medications Ordered in ED Medications  metoCLOPramide (REGLAN) injection 10 mg (has no administration in time range)  diphenhydrAMINE (BENADRYL) injection 25 mg (has no administration in time range)  ketorolac (TORADOL) 15 MG/ML injection 15 mg (has no administration in time range)  sodium chloride 0.9 % bolus 1,000 mL (has no administration in time range)  ondansetron (ZOFRAN-ODT) disintegrating tablet 4 mg (4 mg Oral Given 09/15/21 3007)    ED Course  I have reviewed the triage vital signs and the nursing notes.  Pertinent labs & imaging results that were available during my care of the patient were reviewed by me and considered in my medical decision making (see chart for details).  Patient seen and examined.  Patient with history of toxoplasmosis.  She is currently on treatment.  Headaches are ongoing, subacute without active fevers.  No concerning neurologic progression.  She had an MRI yesterday which was improved compared to MRI performed in October 2021.  Given her reassuring exam, will treat symptoms today.  I do not see any issues that would require acute infectious disease or neurosurgery evaluation.  We will give headache cocktail and reassess.  Signout to [employer] at shift change who will reassess and discharge to home if feeling better.  Given patient's hearing issues, referral given for ENT.  This can  be done as outpatient.  Vital signs reviewed and are as follows: BP 117/80   Pulse 72   Temp 98.1 F (36.7 C) (Temporal)   Resp 16   LMP 05/28/2021 (Approximate)   SpO2 100%      MDM Rules/Calculators/A&P                           Pending completion of treatment.    Final Clinical Impression(s) / ED Diagnoses Final  diagnoses:  Acute nonintractable headache, unspecified headache type  Tinnitus of left ear    Rx / DC Orders ED Discharge Orders     None        Carlisle Cater, PA-C 09/15/21 1554    Dorie Rank, MD 09/16/21 [phone]

## 2021-09-15 NOTE — Discharge Instructions (Addendum)

## 2021-09-15 NOTE — ED Triage Notes (Signed)
Pt states that she has a headache that started on Tuesday and it has got gradually worse with some dizziness. Pt had craniotomy last year

## 2021-09-15 NOTE — ED Provider Notes (Addendum)
Patient taken at shift hand off from [facility] for headache. Hx of chronic headache, AIDS  with cd4 count of 51 on 08/27/21. Known CNS toxoplasmosis. MRI brain w/wo preformed yesterday shows Improving Lesions. Currently treating headache. No new neuro deficits. Currently under treatment for AIDS and Toxo by  infectious disease   Patient headache has completely resolved.  She is feeling much better and will be discharged at this time to follow-up with her infectious disease specialist      Margarita Mail, PA-C 09/15/21 Leta Jungling, MD 09/15/21 2001

## 2021-09-15 NOTE — ED Provider Notes (Signed)
Emergency Medicine Provider Triage Evaluation Note  Kelsey Rodriguez , a 30 y.o. female  was evaluated in triage.  Pt complains of headache. Hx of chronic headaches. Describes a "tension" sensation that is global with some soreness to pressure of her R temple. Has associated waxing/waning blurry vision and floaters, nausea, loud sounds in her L eye that resemble "train tracks". Has tried ibuprofen 800mg  w/o relief. No fevers in >1 week. Hx of craniotomy for resection of a small temporal lesion. Also hx of CNS toxoplasmosis. CD4 count 51 on 08/27/21. Had MRI brain w/w/o contrast completed yesterday. No formal radiology yet in PACS.  Review of Systems  Positive: As above Negative: As above  Physical Exam  BP (!) 117/93 (BP Location: Right Arm)   Pulse 82   Temp 98.8 F (37.1 C)   Resp 18   LMP 05/28/2021 (Approximate)   SpO2 100%  Gen:   Awake, no distress. Tearful.  Resp:  Normal effort  MSK:   Moves extremities without difficulty  Other:  No obvious focal deficits. Ambulatory to triage room.  Medical Decision Making  Medically screening exam initiated at 5:14 AM.  Appropriate orders placed.  Mayzee Bih Daisey was informed that the remainder of the evaluation will be completed by another provider, this initial triage assessment does not replace that evaluation, and the importance of remaining in the ED until their evaluation is complete.  Headache  Radiology called to have MRI results changed to STAT   Antonietta Breach, PA-C 09/15/21 0524    Merryl Hacker, MD 09/16/21 [phone]

## 2021-09-16 ENCOUNTER — Telehealth: Payer: Self-pay

## 2021-09-16 ENCOUNTER — Other Ambulatory Visit: Payer: Medicare Other

## 2021-09-16 NOTE — Telephone Encounter (Signed)
Transition Care Management Unsuccessful Follow-up Telephone Call  Date of discharge and from where:  09/15/2021-   Attempts:  1st Attempt  Reason for unsuccessful TCM follow-up call:  Left voice message

## 2021-09-17 ENCOUNTER — Encounter: Payer: Self-pay | Admitting: Physical Therapy

## 2021-09-17 ENCOUNTER — Ambulatory Visit: Payer: Medicare Other | Attending: Physician Assistant | Admitting: Physical Therapy

## 2021-09-17 ENCOUNTER — Other Ambulatory Visit: Payer: Self-pay

## 2021-09-17 DIAGNOSIS — M5441 Lumbago with sciatica, right side: Secondary | ICD-10-CM | POA: Diagnosis not present

## 2021-09-17 DIAGNOSIS — M25552 Pain in left hip: Secondary | ICD-10-CM | POA: Insufficient documentation

## 2021-09-17 DIAGNOSIS — M25551 Pain in right hip: Secondary | ICD-10-CM | POA: Diagnosis present

## 2021-09-17 NOTE — Telephone Encounter (Signed)
Transition Care Management Unsuccessful Follow-up Telephone Call  Date of discharge and from where:  [facility]   Attempts:  2nd Attempt  Reason for unsuccessful TCM follow-up call:  Left voice message      

## 2021-09-17 NOTE — Patient Instructions (Signed)
    Voncille Lo, PT, [place] Certified Exercise Expert for the Aging Adult  09/17/21 8:49 AM Phone: [phone] Fax: [phone]

## 2021-09-17 NOTE — Therapy (Signed)
[place], [place], [address] Phone: [phone]   Fax:  [phone]  Physical Therapy Treatment  Patient Details  Name: Hania Cerone MRN: [id number] Date of Birth: 1991/12/13 Referring Provider (PT): Erskine Emery, PA-C   Encounter Date: 09/17/2021   PT End of Session - 09/17/21 0749     Visit Number 4    Number of Visits 12    Date for PT Re-Evaluation 10/12/21    Authorization Type BCBS Medicare with [facility] MCD secondary, progress note by visit 10 and recheck FOTO visits 6 and 10    PT Start Time 0800    PT Stop Time 0855    PT Time Calculation (min) 55 min    Activity Tolerance Patient tolerated treatment well    Behavior During Therapy [facility] for tasks assessed/performed             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   Adrenal insufficiency ([place])    Anemia of chronic disease 03/11/2012   Back pain of lumbar region with sciatica 02/12/2015   Bell's palsy 08/26/2013   Brain lesion    Bullae 05/30/2012   Chronic back pain    Chronic leg pain    bilateral knees, ankles   Depression    Fatigue    GERD (gastroesophageal reflux disease)    Headache    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   Vertigo    Wears glasses     Past Surgical History:  Procedure Laterality Date   APPENDECTOMY  ~ 3474   APPLICATION OF CRANIAL NAVIGATION N/A 05/16/2020   Procedure: APPLICATION OF CRANIAL NAVIGATION;  Surgeon: Newman Pies, MD;  Location: [place];  Service: Neurosurgery;  Laterality: N/A;   CRANIOTOMY Right  05/16/2020   Procedure: Craniotomy for Resection of Lesion;  Surgeon: Newman Pies, MD;  Location: [place];  Service: Neurosurgery;  Laterality: Right;  right   DILATION AND CURETTAGE OF UTERUS  2008   ESOPHAGOGASTRODUODENOSCOPY  03/11/2012   Procedure: ESOPHAGOGASTRODUODENOSCOPY (EGD);  Surgeon: Lafayette Dragon, MD;  Location: [facility] ENDOSCOPY;  Service: Endoscopy;  Laterality: N/A;   ESOPHAGOGASTRODUODENOSCOPY N/A 03/07/2014   Procedure: ESOPHAGOGASTRODUODENOSCOPY (EGD);  Surgeon: Gatha Mayer, MD;  Location: [facility] ENDOSCOPY;  Service: Endoscopy;  Laterality: N/A;   LUNG BIOPSY  02/2012   TOTAL HIP ARTHROPLASTY Left 12/14/2015   Procedure: LEFT TOTAL HIP ARTHROPLASTY ANTERIOR APPROACH;  Surgeon: Mcarthur Rossetti, MD;  Location: [facility];  Service: Orthopedics;  Laterality: Left;   TOTAL HIP ARTHROPLASTY Right 04/04/2016   Procedure: RIGHT TOTAL HIP ARTHROPLASTY ANTERIOR APPROACH;  Surgeon: Mcarthur Rossetti, MD;  Location: [facility];  Service: Orthopedics;  Laterality: Right;    There were no vitals filed for this visit.   Subjective Assessment - 09/17/21 0806     Subjective I am feeling better in my back but I went to the ED for headache on Sunday. I dont have a headache now. Pt with back and hip pain but more centralized today after TPDN.  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 Yes    Pain Score 4     Pain Location Back    Pain Orientation Right    Pain Descriptors / Indicators Aching;Sharp    Pain Type Chronic pain    Pain Radiating Towards bil lateral hip  regions but R > L    Pain Onset More than a month ago    Pain Frequency Constant                               [facility] Adult PT Treatment/Exercise - 09/17/21 0001       Lumbar Exercises: Stretches   Lower Trunk Rotation 5 reps;20 seconds    Lower Trunk Rotation Limitations bil    Other Lumbar Stretch Exercise childs pose exercise forward and  side stretch 4 x 30 sec    Other Lumbar Stretch Exercise modified Thomas streth on right 2 x 30      Lumbar Exercises: Aerobic   Nustep level 3 6 minutes      Lumbar Exercises: Standing   Lifting Limitations hip flexion 2x10 BIL with 3sec lowering    Other Standing Lumbar Exercises resisted hip abd with RTB 2 x 10    Other Standing Lumbar Exercises hip extension resisted 2 x 10,  hip fleixon standing with RTB 2 x 10  and sitting 2 x 10 with RTB,  pain with supine hip flexion/DC      Lumbar Exercises: Supine   Bent Knee Raise 10 reps;3 seconds    Bent Knee Raise Limitations x 2 with VC for slow and controlled    Bridge with [employer] 10 reps;3 seconds    Bridge with [employer] Limitations x 3 using towel roll      Modalities   Modalities Moist Heat      Moist Heat Therapy   Number Minutes Moist Heat 15 Minutes    Moist Heat Location Lumbar Spine;Hip   concurrent with exercises     Manual Therapy   Manual Therapy Soft tissue mobilization;Myofascial release;Joint mobilization    Manual therapy comments skilled Palpation iwth TPDN    Joint Mobilization R hip AP, LAD stretch of piriformis    Soft tissue mobilization R QL, R gluteals,    Myofascial Release R QL              Trigger Point Dry Needling - 09/17/21 0001     Consent Given? Yes    Education Handout Provided Previously provided    Muscles Treated Back/Hip Quadratus lumborum;Gluteus medius;Piriformis   R only   Dry Needling Comments 75 mm .30 gage    Gluteus Medius Response Twitch response elicited;Palpable increased muscle length    Piriformis  Response Twitch response elicited;Palpable increased muscle length    Quadratus Lumborum Response Twitch response elicited;Palpable increased muscle length                   PT Education - 09/17/21 0811     Education Details Pt went over FOTO report and gave written handout reviewed HEP    Person(s) Educated Patient  Methods Explanation;Demonstration;Tactile cues;Verbal cues    Comprehension Verbalized understanding;Returned demonstration              PT Short Term Goals - 09/17/21 0830       PT SHORT TERM GOAL #1   Title Independent with HEP    Baseline needs HEP in morning and before she goes to bed    Time 3    Period Weeks    Status Achieved    Target Date 09/21/21      PT SHORT TERM GOAL #2   Title Increase right hip flexion AROM at least 10 deg or greater to improve ability for car transfers    Baseline 70 deg  eval  80 degrees 09-17-21    Time 3    Period Weeks    Status On-going      PT SHORT TERM GOAL #3   Title Centralize RLE radiating parasthesias and pain proximally to at least knee region for improved ADL tolerance without leg pain    Baseline Pt centralized to back and anterior inguinal line    Time 3    Period Weeks    Status On-going               PT Long Term Goals - 08/31/21 1251       PT LONG TERM GOAL #1   Title Improve FOTO outcome measure score to 75% or greater functional status    Baseline 59%    Time 6    Period Weeks    Status New    Target Date 10/12/21      PT LONG TERM GOAL #2   Title Pt. will have decreased sleep disturbance symptoms due to RLE pain by at least 50% from baseline and be able to tolerate sleep in right sidelying unable    Baseline difficulty tolerating due to sleep disturbance    Time 6    Period Weeks    Status New    Target Date 10/12/21      PT LONG TERM GOAL #3   Title Pt. will increase bilat. hip abduction and extension strength at least grossly 1/2 MMT grade to help decrease lateral hip  pain symptoms with walking and for decreased lumbar strain with lifting activities for chores    Baseline see objective    Time 6    Period Weeks    Status New    Target Date 10/12/21      PT LONG TERM GOAL #4   Title Pt. will be independent with advanced HEP for continued progression after d/c from formal therapy    Time 6    Period Weeks    Status New    Target Date 10/12/21                   Plan - 09/17/21 0749     Clinical Impression Statement Pt enters clinic with 4/10 pain and 0/10 after TPDN and exercises.  Pt complained of pain in R low back QL and anterior thight today.  Pain decreased to 0/10 and slight discomfort over inguinal line ant thigh. Pt added to HEP for standing resisted LE exercises and given handout.  Pt with increased pain with resisted hip flexion in supine but able to perform seated and standing.   Pt will continue with SIJ exercises given last visit and will re assess next visit.  Pt consented to TPDn and was closely monitored throughout session. FOTO report from eval reviewed and STG #  1 achieved Will continue with pain management and emphasis on exercise for pain control.  Hip flexion increased AROM to 80 degrees    Personal Factors and Comorbidities Comorbidity 3+    Comorbidities chronic pain history, CRPS, AIDS, depression, bilateral THA, see PMH    Examination-Activity Limitations Sleep;Lift;Squat;Bend;Locomotion Level    Examination-Participation Restrictions Art gallery manager;Shop    Rehab Potential Fair    Clinical Impairments Affecting Rehab Potential complicating factors from PMH, chronic pain history    PT Frequency 2x / week    PT Duration 6 weeks    PT Treatment/Interventions Electrical Stimulation;Moist Heat;Neuromuscular re-education;Patient/family education;Therapeutic activities;Therapeutic exercise;Dry needling;Manual techniques;Passive range of motion;Functional mobility training;Ultrasound;ADLs/Self Care Home  Management;Cryotherapy;Iontophoresis 4mg /ml Dexamethasone;Gait training;Traction;Taping;Spinal Manipulations    PT Next Visit Plan review FOTO pt. report by visit 3 check response to trunk extension ROM for centralization of RLE symptoms, NUSTEP warm up for hip ROM, work on hip ROM and gentle stretches as tolerated, STM vs. potential trial dry needling to gluteus medius and QL region bilaterally, continue/progress lumbar ROM as tolerated (mild extension bias at eval) and progress to lumbar and core stabilization exercises as tolerated, modalities prn to lateral hip region and progress glut med strengthening pending pain.  Pt late last visit and recieved only TPDN    PT Home Exercise Plan access code: [email or web address]    Consulted and Agree with Plan of Care Patient             Patient will benefit from skilled therapeutic intervention in order to improve the following deficits and impairments:  Pain, Impaired flexibility, Decreased strength, Decreased activity tolerance, Decreased range of motion, Hypomobility, Difficulty walking, Impaired sensation, Increased muscle spasms  Visit Diagnosis: Acute bilateral low back pain with right-sided sciatica  Pain in right hip  Pain in left hip Added to HEP Access Code: [email or web address]: [email or web address]: 10/04/2022Prepared by: Donnetta Simpers BeardsleyExercises   Standing Hip Abduction Kicks - 1 x daily - 7 x weekly - 2 sets - 10 reps  Standing Hip Extension Kicks - 1 x daily - 7 x weekly - 2 sets - 10 reps  Seated Hip Flexion March with Ankle Weights - 1 x daily - 7 x weekly - 2 sets - 10 reps  Standing Hip Flexion with Resistance Loop - 1 x daily - 7 x weekly - 2 sets - 10 reps  Supine Bridge with Mini Swiss Ball Between Knees - 1 x daily - 7 x weekly - 3 sets - 10 reps   Problem List Patient Active Problem List   Diagnosis Date Noted   Tubo-ovarian abscess 04/25/2021   TOA (tubo-ovarian abscess) 04/15/2021   Cervical dysplasia, mild  12/06/2020   Visit for routine gyn exam 11/01/2020   STD exposure 11/01/2020   S/P craniotomy 05/16/2020   Brain tumor ([place]) 05/16/2020   Headache due to intracranial disease 05/09/2020   Hypertension    Pain of upper abdomen    Suicide ideation    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[place])    Seizure ([place])    Intracranial mass    Intractable headache 02/04/2020   Encephalitis, myelitis, 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[place]) 01/31/2020   Rotator cuff strain 01/26/2020   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   AIDS (acquired immune deficiency syndrome) ([place]) 11/07/2019   Cerebral edema ([place]) 10/28/2019   Midline shift of brain    Pruritus 08/29/2019   Tendinopathy of left shoulder 01/18/2019   Chronic pelvic pain in female 01/04/2019   Lower abdominal pain 06/21/2018  Syphilis 02/26/2018   Tuberculosis    Infertility, female 02/12/2018   ASCUS with positive high risk HPV cervical 09/14/2017   Acute right-sided low back pain with right-sided sciatica 08/24/2017   Complex regional pain syndrome 02/03/2017   Headache 10/28/2016   Avascular necrosis of bone of right hip ([place]) 04/04/2016   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   Avascular necrosis of bone of left hip ([place]) 12/14/2015   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   Nausea 05/18/2015   Vertigo 01/23/2015   Primary adrenal insufficiency ([place]) 01/03/2015   Chest pain 07/07/2013   HIV (human immunodeficiency virus infection) ([place]) 03/16/2012   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 [place], [place], [address] Phone: [phone]   Fax:  [phone]  Name: Evalina Tabak MRN: [id number] Date of Birth: [birth year]

## 2021-09-18 ENCOUNTER — Other Ambulatory Visit (HOSPITAL_COMMUNITY): Payer: Self-pay

## 2021-09-18 ENCOUNTER — Ambulatory Visit: Payer: Medicare Other | Admitting: Orthopaedic Surgery

## 2021-09-18 NOTE — Telephone Encounter (Signed)
Transition Care Management Follow-up Telephone Call Date of discharge and from where: 09/15/2021 from [facility] ED How have you been since you were released from the hospital? Pt stated that she is feeling much better. Pt stated that she did not have any questions or concerns at this time and will follow up with ENT per ED recommendation.  Any questions or concerns? No  Items Reviewed: Did the pt receive and understand the discharge instructions provided? Yes  Medications obtained and verified? Yes  Other? No  Any new allergies since your discharge? No  Dietary orders reviewed? No Do you have support at home? Yes   Functional Questionnaire: (I = Independent and D = Dependent) ADLs: I  Bathing/Dressing- I  Meal Prep- I  Eating- I  Maintaining continence- I  Transferring/Ambulation- I  Managing Meds- I   Follow up appointments reviewed:  PCP Hospital f/u appt confirmed? No   Specialist Hospital f/u appt confirmed? No  Pt will call and schedule ENT follow up.  Are transportation arrangements needed? No  If their condition worsens, is the pt aware to call PCP or go to the Emergency Dept.? Yes Was the patient provided with contact information for the PCP's office or ED? Yes Was to pt encouraged to call back with questions or concerns? Yes

## 2021-09-19 ENCOUNTER — Ambulatory Visit: Payer: Medicare Other

## 2021-09-20 ENCOUNTER — Other Ambulatory Visit (HOSPITAL_COMMUNITY): Payer: Self-pay

## 2021-09-24 ENCOUNTER — Other Ambulatory Visit: Payer: Self-pay

## 2021-09-24 ENCOUNTER — Encounter: Payer: Self-pay | Admitting: Physical Therapy

## 2021-09-24 ENCOUNTER — Ambulatory Visit: Payer: Medicare Other | Admitting: Physical Therapy

## 2021-09-24 DIAGNOSIS — M5441 Lumbago with sciatica, right side: Secondary | ICD-10-CM

## 2021-09-24 DIAGNOSIS — M25552 Pain in left hip: Secondary | ICD-10-CM

## 2021-09-24 DIAGNOSIS — M25551 Pain in right hip: Secondary | ICD-10-CM

## 2021-09-24 NOTE — Therapy (Addendum)
[place], [place], [address] Phone: [phone]   Fax:  [phone]  Physical Therapy Treatment/Discharge Note  Patient Details  Name: Kelsey Rodriguez MRN: [id number] Date of Birth: [birth year] Referring Provider (PT): Erskine Emery, PA-C   Encounter Date: 09/24/2021   PT End of Session - 09/24/21 1339     Visit Number 5    Number of Visits 12    Date for PT Re-Evaluation 10/12/21    Authorization Type BCBS Medicare with [facility] MCD secondary, progress note by visit 10 and recheck FOTO visits 6 and 10    PT Start Time 1330    PT Stop Time 1426    PT Time Calculation (min) 56 min    Activity Tolerance Patient tolerated treatment well    Behavior During Therapy [facility] for tasks assessed/performed             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   Adrenal insufficiency ([place])    Anemia of chronic disease 03/11/2012   Back pain of lumbar region with sciatica 02/12/2015   Bell's palsy 08/26/2013   Brain lesion    Bullae 05/30/2012   Chronic back pain    Chronic leg pain    bilateral knees, ankles   Depression    Fatigue    GERD (gastroesophageal reflux disease)    Headache    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   HIV (human immunodeficiency virus infection) ([place]) 02/2012   Laceration of ankle, right 11/18/2012   Lumbar radiculopathy    Meningitis 02/18/2018   Pelvic pain    PID (acute pelvic inflammatory disease) 02/26/2018   Pneumonia    Reflux esophagitis 03/11/2012   Seizure ([place])    Tuberculosis    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   Vertigo    Wears glasses     Past Surgical History:  Procedure Laterality Date   APPENDECTOMY  ~ 0539   APPLICATION OF CRANIAL NAVIGATION N/A 05/16/2020   Procedure: APPLICATION OF CRANIAL NAVIGATION;  Surgeon: Newman Pies, MD;  Location: [facility];  Service: Neurosurgery;  Laterality: N/A;    CRANIOTOMY Right 05/16/2020   Procedure: Craniotomy for Resection of Lesion;  Surgeon: Newman Pies, MD;  Location: [facility];  Service: Neurosurgery;  Laterality: Right;  right   DILATION AND CURETTAGE OF UTERUS  2008   ESOPHAGOGASTRODUODENOSCOPY  03/11/2012   Procedure: ESOPHAGOGASTRODUODENOSCOPY (EGD);  Surgeon: Lafayette Dragon, MD;  Location[facility];  Service: Endoscopy;  Laterality: N/A;   ESOPHAGOGASTRODUODENOSCOPY N/A 03/07/2014   Procedure: ESOPHAGOGASTRODUODENOSCOPY (EGD);  Surgeon: Gatha Mayer, MD;  Location[facility]COPY;  Service: Endoscopy;  Laterality: N/A;   LUNG BIOPSY  02/2012   TOTAL HIP ARTHROPLASTY Left 12/14/2015   Procedure: LEFT TOTAL HIP ARTHROPLASTY ANTERIOR APPROACH;  Surgeon: Mcarthur Rossetti, MD;  Location: [facility];  Service: Orthopedics;  Laterality: Left;   TOTAL HIP ARTHROPLASTY Right 04/04/2016   Procedure: RIGHT TOTAL HIP ARTHROPLASTY ANTERIOR APPROACH;  Surgeon: Mcarthur Rossetti, MD;  Location: [facility];  Service: Orthopedics;  Laterality: Right;    There were no vitals filed for this visit.   Subjective Assessment - 09/24/21 1335     Subjective I just scheduled an appt with an ENT for the noises that I hear in my head. (nov. 14)  no headache now,  I dont feel the sharp pain anymore but I still have aching in my R low back and hip. I still cannot sleep  for long time on my R side. 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 Yes    Pain Score 4     Pain Location Back    Pain Orientation Right    Pain Descriptors  / Indicators Aching    Pain Radiating Towards no  shooting pain into my ant thigh    Pain Onset More than a month ago                  [facility] Adult PT Treatment/Exercise:   Therapeutic Exercise:   Supine Gluteus Stretch - PT assist with maximal stretch Lower Trunk Rotations 1 sets - 10 reps - 5-10 seconds hold  Hooklying Clamshell with Resistance RTB -  2 sets - 10 reps  Modified Thomas Stretch (Mirrored) - 1 x daily - 7 x weekly - 3 sets - 30sec hold  Standing Anti-Rotation Press with Anchored Resistance - 1 x daily - 7 x weekly - 3 sets - 10 reps  Supine Bridge with Mini Swiss Ball Between Knees - 3 sets - 10 reps  Standing Hip Hinge with Dowel - 1 x daily - 7 x weekly - 2 sets- 10 reps with dowel  1 x 10 with wall as external cue Modified Dead Lift with Pelvic Contraction -  2 sets -  8 reps then 6 reps from 8 inch step  Goblet Squat (seated box with chair) with Kettlebell -  using 15 # KB 2 sets - 10 reps  Pt tends to forward pitch with squat and use momentum to stand . Working on challenging ability to carry weighted items  Manual Therapy:  See flow sheet   Self Care:  Lifting demo and return demo modified on 8 inch step.  Keeping object close and decreasing lever arm stress to spine    ITEMS NOT PERFORMED TODAY:   Standing Lumbar Extension Standing Hip Abduction Kicks - 2 sets - 10 reps RTB Standing Hip Extension Kicks -- 2 sets - 10 reps RTB Seated Hip Flexion March with Ankle Weights - 2 sets - 10 reps RTB Standing Hip Flexion with Resistance Loop - 2 sets - 10 reps  RTB     Access Code: [email or web address]: [email or web address]: 10/11/2022Prepared by: Donnetta Simpers BeardsleyExercises   Standing Hip Hinge with Dowel - 1 x daily - 7 x weekly - 3 sets - 10 reps  Modified Dead Lift with Pelvic Contraction - 1 x daily - 7 x weekly - 3 sets - 6-8 reps  Goblet Squat with Kettlebell - 1 x daily - 7 x weekly - 3 sets - 10 reps    PT Education - 09/24/21 1429      Education Details Progressed strength for functional acitivities.  Gave FOTO report handout, reviewed lifting procedure and basics of squat , hip hinge and deadlift modidfied    Person(s) Educated Patient    Methods Explanation;Demonstration;Tactile cues;Verbal cues;Handout    Comprehension Verbalized understanding;Returned demonstration              PT Short Term Goals - 09/24/21 1442  PT SHORT TERM GOAL #1   Title Independent with HEP    Baseline needs HEP in morning and before she goes to bed    Time 3    Period Weeks    Status Achieved    Target Date 09/21/21      PT SHORT TERM GOAL #2   Title Increase right hip flexion AROM at least 10 deg or greater to improve ability for car transfers    Baseline 70 deg  eval  80 degrees 09-17-21 pt utlizing 15 # wt for squats to improve car xfer    Time 3    Period Weeks    Status On-going    Target Date 09/21/21      PT SHORT TERM GOAL #3   Title Centralize RLE radiating parasthesias and pain proximally to at least knee region for improved ADL tolerance without leg pain    Baseline Pt centralized into low back    Time 3    Period Weeks    Status Achieved    Target Date 09/21/21               PT Long Term Goals - 09/24/21 1444       PT LONG TERM GOAL #1   Title Improve FOTO outcome measure score to 75% or greater functional status    Baseline 59%    Time 6    Period Weeks    Status Unable to assess      PT LONG TERM GOAL #2   Title Pt. will have decreased sleep disturbance symptoms due to RLE pain by at least 50% from baseline and be able to tolerate sleep in right sidelying unable    Baseline Pt sleeping better but still unable to sleep on R side/ complains that she will wake up sore if she sleeps on R side    Time 6    Period Weeks    Status On-going      PT LONG TERM GOAL #3   Title Pt. will increase bilat. hip abduction and extension strength at least grossly 1/2 MMT grade to help decrease lateral  hip pain symptoms with walking and for decreased lumbar strain with lifting activities for chores    Baseline Pt with centralized LB pain today 09-24-21  none radiating into ant thigh    Time 6    Period Weeks    Status On-going      PT LONG TERM GOAL #4   Title Pt. will be independent with advanced HEP for continued progression after d/c from formal therapy    Time 6    Period Weeks    Status On-going                   Plan - 09/24/21 1339     Clinical Impression Statement Pt enters clinic with 4/10 pain again and has tried to do her HEP ,but unsure of consistency.  Pt with trigger point pain in R glut med and piriformis  Pt consents to TPDN and is closely monitored throughout session.  Pt with marked twitch responses.  Pt then completed some essential strength movements using weights to challenge strength in squat, hip hinge and modified deadlift. Pt complained  of pain in low back QL, but no ant thigh pain today.  After RX with exercise pt reports fatigue in muscle but no pain.  No pain in inguinal line this week.  Pt reduced to 0/10 at end of session but feels fatigued.  STG # 1 and #3 achieved.  Will continue with concentration on exercise and movement for pain management.    Personal Factors and Comorbidities Comorbidity 3+    Comorbidities chronic pain history, CRPS, AIDS, depression, bilateral THA, see PMH    Examination-Activity Limitations Sleep;Lift;Squat;Bend;Locomotion Level    Examination-Participation Restrictions Art gallery manager;Shop    Rehab Potential Fair    Clinical Impairments Affecting Rehab Potential complicating factors from PMH, chronic pain history    PT Frequency 2x / week    PT Duration 6 weeks    PT Treatment/Interventions Electrical Stimulation;Moist Heat;Neuromuscular re-education;Patient/family education;Therapeutic activities;Therapeutic exercise;Dry needling;Manual techniques;Passive range of motion;Functional mobility  training;Ultrasound;ADLs/Self Care Home Management;Cryotherapy;Iontophoresis 55m/ml Dexamethasone;Gait training;Traction;Taping;Spinal Manipulations    PT Home Exercise Plan access code: [email or web address]   Consulted and Agree with Plan of Care Patient             Patient will benefit from skilled therapeutic intervention in order to improve the following deficits and impairments:  Pain, Impaired flexibility, Decreased strength, Decreased activity tolerance, Decreased range of motion, Hypomobility, Difficulty walking, Impaired sensation, Increased muscle spasms  Visit Diagnosis: Acute bilateral low back pain with right-sided sciatica  Pain in right hip  Pain in left hip     Problem List Patient Active Problem List   Diagnosis Date Noted   Tubo-ovarian abscess 04/25/2021   TOA (tubo-ovarian abscess) 04/15/2021   Cervical dysplasia, mild 12/06/2020   Visit for routine gyn exam 11/01/2020   STD exposure 11/01/2020   S/P craniotomy 05/16/2020   Brain tumor ([place] 05/16/2020   Headache due to intracranial disease 05/09/2020   Hypertension    Pain of upper abdomen    Suicide ideation    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[place]    Seizure ([place]    Intracranial mass    Intractable headache 02/04/2020   Encephalitis, myelitis, 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[place] 01/31/2020   Rotator cuff strain 01/26/2020   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   AIDS (acquired immune deficiency syndrome) ([place] 11/07/2019   Cerebral edema ([place] 10/28/2019   Midline shift of brain    Pruritus 08/29/2019   Tendinopathy of left shoulder 01/18/2019   Chronic pelvic pain in female 01/04/2019   Lower abdominal pain 06/21/2018   Syphilis 02/26/2018   Tuberculosis    Infertility, female 02/12/2018   ASCUS with positive high risk HPV cervical 09/14/2017   Acute right-sided low back pain with right-sided sciatica 08/24/2017   Complex regional pain syndrome 02/03/2017    Headache 10/28/2016   Avascular necrosis of bone of right hip ([place] 04/04/2016   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   Avascular necrosis of bone of left hip ([place] 12/14/2015   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   Nausea 05/18/2015   Vertigo 01/23/2015   Primary adrenal insufficiency ([place] 01/03/2015   Chest pain 07/07/2013   HIV (human immunodeficiency virus infection) ([place] 03/16/2012   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GGolf N[place] [address]Phone: [phone]  Fax:  [phone] Name: BDorien BessentMRN: [id number] of Birth: 1December 20, 1992  PHYSICAL THERAPY DISCHARGE SUMMARY  Visits from Start of Care: 5  Current functional level related to goals / functional outcomes: unknown   Remaining deficits: unknown   Education / Equipment: HEP  Patient agrees to discharge. Patient goals were partially met. Patient is being discharged due to not returning since the last visit.  Voncille Lo, PT, [place] Certified Exercise Expert for the Aging Adult  10/24/21 9:17 AM Phone: [phone] Fax: [phone]

## 2021-09-24 NOTE — Patient Instructions (Signed)
      Voncille Lo, PT, [place] Certified Exercise Expert for the Aging Adult  09/24/21 2:28 PM Phone: [phone] Fax: [phone]

## 2021-09-26 ENCOUNTER — Ambulatory Visit: Payer: Medicare Other

## 2021-09-30 ENCOUNTER — Other Ambulatory Visit: Payer: Self-pay

## 2021-09-30 ENCOUNTER — Ambulatory Visit (INDEPENDENT_AMBULATORY_CARE_PROVIDER_SITE_OTHER): Payer: Medicare Other | Admitting: Internal Medicine

## 2021-09-30 ENCOUNTER — Encounter: Payer: Self-pay | Admitting: Internal Medicine

## 2021-09-30 ENCOUNTER — Other Ambulatory Visit (HOSPITAL_COMMUNITY): Payer: Self-pay

## 2021-09-30 VITALS — BP 115/78 | HR 81 | Temp 98.6°F | Wt 175.0 lb

## 2021-09-30 DIAGNOSIS — B582 Toxoplasma meningoencephalitis: Secondary | ICD-10-CM

## 2021-09-30 DIAGNOSIS — B20 Human immunodeficiency virus [HIV] disease: Secondary | ICD-10-CM | POA: Diagnosis not present

## 2021-09-30 DIAGNOSIS — Z23 Encounter for immunization: Secondary | ICD-10-CM

## 2021-09-30 MED ORDER — GABAPENTIN 300 MG PO CAPS
300.0000 mg | ORAL_CAPSULE | Freq: Every day | ORAL | 11 refills | Status: DC
  Filled 2021-09-30: qty 30, 30d supply, fill #0

## 2021-09-30 MED ORDER — GABAPENTIN 300 MG PO CAPS: 300.0000 mg | ORAL_CAPSULE | Freq: Every day | ORAL | 11 refills | Status: DC
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
## 2021-10-02 ENCOUNTER — Encounter: Payer: Self-pay | Admitting: Orthopaedic Surgery

## 2021-10-02 ENCOUNTER — Ambulatory Visit (INDEPENDENT_AMBULATORY_CARE_PROVIDER_SITE_OTHER): Payer: Medicare Other | Admitting: Orthopaedic Surgery

## 2021-10-02 ENCOUNTER — Other Ambulatory Visit: Payer: Self-pay

## 2021-10-02 ENCOUNTER — Other Ambulatory Visit: Payer: Self-pay | Admitting: Internal Medicine

## 2021-10-02 DIAGNOSIS — M25551 Pain in right hip: Secondary | ICD-10-CM | POA: Diagnosis not present
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
## 2021-10-03 ENCOUNTER — Telehealth: Payer: Self-pay

## 2021-10-03 LAB — COMPLETE METABOLIC PANEL WITH GFR
AG Ratio: 1.2 (calc) (ref 1.0–2.5)
AG Ratio: 1.3 (calc) (ref 1.0–2.5)
ALT: 7 U/L (ref 6–29)
ALT: 10 U/L (ref 6–29)
AST: 14 U/L (ref 10–30)
AST: 12 U/L (ref 10–30)
Albumin: 4.5 g/dL (ref 3.6–5.1)
Albumin: 4.7 g/dL (ref 3.6–5.1)
Alkaline phosphatase (APISO): 64 U/L (ref 31–125)
Alkaline phosphatase (APISO): 69 U/L (ref 31–125)
BUN: 14 mg/dL (ref 7–25)
BUN: 12 mg/dL (ref 7–25)
CO2: 24 mmol/L (ref 20–32)
CO2: 21 mmol/L (ref 20–32)
Calcium: 9.8 mg/dL (ref 8.6–10.2)
Calcium: 10.1 mg/dL (ref 8.6–10.2)
Chloride: 105 mmol/L (ref 98–110)
Chloride: 105 mmol/L (ref 98–110)
Creat: 0.72 mg/dL (ref 0.50–0.97)
Creat: 0.93 mg/dL (ref 0.50–0.97)
Globulin: 3.8 g/dL (calc) — ABNORMAL HIGH (ref 1.9–3.7)
Globulin: 3.5 g/dL (calc) (ref 1.9–3.7)
Glucose, Bld: 72 mg/dL (ref 65–99)
Glucose, Bld: 67 mg/dL (ref 65–99)
Potassium: 4.2 mmol/L (ref 3.5–5.3)
Potassium: 4.2 mmol/L (ref 3.5–5.3)
Sodium: 137 mmol/L (ref 135–146)
Sodium: 137 mmol/L (ref 135–146)
Total Bilirubin: 0.4 mg/dL (ref 0.2–1.2)
Total Bilirubin: 0.3 mg/dL (ref 0.2–1.2)
Total Protein: 8.3 g/dL — ABNORMAL HIGH (ref 6.1–8.1)
Total Protein: 8.2 g/dL — ABNORMAL HIGH (ref 6.1–8.1)
eGFR: 114 mL/min/{1.73_m2} (ref >=60)
eGFR: 84 mL/min/{1.73_m2} (ref >=60)

## 2021-10-03 LAB — CBC
HCT: 34.5 % — ABNORMAL LOW (ref 35.0–45.0)
Hemoglobin: 11.3 g/dL — ABNORMAL LOW (ref 11.7–15.5)
MCH: 29.5 pg (ref 27.0–33.0)
MCHC: 32.8 g/dL (ref 32.0–36.0)
MCV: 90.1 fL (ref 80.0–100.0)
MPV: 12.1 fL (ref 7.5–12.5)
Platelets: 246 10*3/uL (ref 140–400)
RBC: 3.83 10*6/uL (ref 3.80–5.10)
RDW: 17.3 % — ABNORMAL HIGH (ref 11.0–15.0)
WBC: 4.6 10*3/uL (ref 3.8–10.8)

## 2021-10-03 LAB — CBC WITH DIFFERENTIAL/PLATELET
Absolute Monocytes: 434 cells/uL (ref 200–950)
Basophils Absolute: 22 cells/uL (ref 0–200)
Basophils Relative: 0.5 %
Eosinophils Absolute: 138 cells/uL (ref 15–500)
Eosinophils Relative: 3.2 %
HCT: 34.6 % — ABNORMAL LOW (ref 35.0–45.0)
Hemoglobin: 11.3 g/dL — ABNORMAL LOW (ref 11.7–15.5)
Lymphs Abs: 1269 cells/uL (ref 850–3900)
MCH: 29.7 pg (ref 27.0–33.0)
MCHC: 32.7 g/dL (ref 32.0–36.0)
MCV: 90.8 fL (ref 80.0–100.0)
MPV: 11.4 fL (ref 7.5–12.5)
Monocytes Relative: 10.1 %
Neutro Abs: 2438 cells/uL (ref 1500–7800)
Neutrophils Relative %: 56.7 %
Platelets: 248 10*3/uL (ref 140–400)
RBC: 3.81 10*6/uL (ref 3.80–5.10)
RDW: 17.1 % — ABNORMAL HIGH (ref 11.0–15.0)
Total Lymphocyte: 29.5 %
WBC: 4.3 10*3/uL (ref 3.8–10.8)

## 2021-10-03 LAB — LIPID PANEL
Cholesterol: 252 mg/dL — ABNORMAL HIGH (ref <200)
HDL: 75 mg/dL (ref >=50)
LDL Cholesterol (Calc): 155 mg/dL (calc) — ABNORMAL HIGH
Non-HDL Cholesterol (Calc): 177 mg/dL (calc) — ABNORMAL HIGH (ref <130)
Total CHOL/HDL Ratio: 3.4 (calc) (ref <5.0)
Triglycerides: 106 mg/dL (ref <150)

## 2021-10-03 LAB — CRYPTOCOCCAL AG, LTX SCR RFLX TITER
Cryptococcal Ag Screen: NOT DETECTED
MICRO NUMBER:: 12513222
SPECIMEN QUALITY:: ADEQUATE

## 2021-10-03 LAB — FLUORESCENT TREPONEMAL AB(FTA)-IGG-BLD: Fluorescent Treponemal ABS: NONREACTIVE

## 2021-10-03 LAB — RPR: RPR Ser Ql: REACTIVE — AB

## 2021-10-03 LAB — VITAMIN D 25 HYDROXY (VIT D DEFICIENCY, FRACTURES): Vit D, 25-Hydroxy: 23 ng/mL — ABNORMAL LOW (ref 30–100)

## 2021-10-03 LAB — TSH: TSH: 1.24 mIU/L

## 2021-10-03 LAB — RPR TITER: RPR Titer: 1:2 {titer} — ABNORMAL HIGH

## 2021-10-03 NOTE — Telephone Encounter (Signed)
Pt contacted regarding her 1st no-show. She reports that her referring provider instructed her to pause PT and try to manage pain with rest and pain medication. She reports she wishes to cancel future appointments and will call the clinic to reschedule visits as needed.

## 2021-10-08 ENCOUNTER — Ambulatory Visit: Payer: Medicare Other | Admitting: Physical Therapy

## 2021-10-14 ENCOUNTER — Encounter: Payer: Medicare Other | Admitting: Physical Therapy

## 2021-10-14 ENCOUNTER — Other Ambulatory Visit (HOSPITAL_COMMUNITY): Payer: Self-pay

## 2021-10-17 ENCOUNTER — Other Ambulatory Visit (HOSPITAL_COMMUNITY): Payer: Self-pay

## 2021-10-17 ENCOUNTER — Encounter: Payer: Medicare Other | Admitting: Physical Therapy

## 2021-10-23 ENCOUNTER — Telehealth: Payer: Self-pay

## 2021-10-23 NOTE — Telephone Encounter (Signed)
Patient called office requesting to change appointment type to virtual visit. States that she is having flu like symptoms and does not want to come to office feeling sick. Advised patient to contact PCP today regarding symptoms. Appt switched to video visit. Pt is aware that that she will need to schedule appt for labs. Leatrice Jewels, RMA

## 2021-10-24 ENCOUNTER — Other Ambulatory Visit: Payer: Self-pay

## 2021-10-24 ENCOUNTER — Telehealth (INDEPENDENT_AMBULATORY_CARE_PROVIDER_SITE_OTHER): Payer: Medicare Other | Admitting: Internal Medicine

## 2021-10-24 DIAGNOSIS — B582 Toxoplasma meningoencephalitis: Secondary | ICD-10-CM

## 2021-10-24 DIAGNOSIS — R509 Fever, unspecified: Secondary | ICD-10-CM

## 2021-10-24 DIAGNOSIS — Z91199 Patient's noncompliance with other medical treatment and regimen due to unspecified reason: Secondary | ICD-10-CM | POA: Diagnosis not present

## 2021-10-24 DIAGNOSIS — B20 Human immunodeficiency virus [HIV] disease: Secondary | ICD-10-CM | POA: Diagnosis not present

## 2021-10-24 NOTE — Progress Notes (Signed)
   Virtual Visit via Video Note  I connected with Kelsey Rodriguez on 10/24/21 at 11:15 AM EST by a video enabled telemedicine application and verified that I am speaking with the correct person using two identifiers.  Location: Patient: at home Provider: at clinic   I discussed the limitations of evaluation and management by telemedicine and the availability of in person appointments. The patient expressed understanding and agreed to proceed.  History of Present Illness: Covid test is pending Has fever, sore throat, cough. Fever of 102F -flu like illness  missing 3 doses since last in clinic Observations/Objective:   Assessment and Plan: If covid +  - will send in paxlovid  == if negative, will treat for presumptive flu  Hiv disease =  poorly controlled continue with biktarvy.continue to advocate for her to take her medications  every day  CNS toxo = restart mepron; takes oxycodone for headache  Pcp proph = continue on mepron      Follow Up Instructions:    I discussed the assessment and treatment plan with the patient. The patient was provided an opportunity to ask questions and all were answered. The patient agreed with the plan and demonstrated an understanding of the instructions.   The patient was advised to call back or seek an in-person evaluation if the symptoms worsen or if the condition fails to improve as anticipated.  I provided 20 minutes of non-face-to-face time during this encounter.   Carlyle Basques, MD

## 2021-10-25 ENCOUNTER — Other Ambulatory Visit: Payer: Self-pay

## 2021-10-25 DIAGNOSIS — J111 Influenza due to unidentified influenza virus with other respiratory manifestations: Secondary | ICD-10-CM

## 2021-10-25 MED ORDER — OSELTAMIVIR PHOSPHATE 75 MG PO CAPS: 75.0000 mg | ORAL_CAPSULE | Freq: Two times a day (BID) | ORAL | 0 refills | Status: AC

## 2021-11-04 ENCOUNTER — Other Ambulatory Visit (HOSPITAL_COMMUNITY): Payer: Self-pay

## 2021-11-12 ENCOUNTER — Other Ambulatory Visit (HOSPITAL_COMMUNITY): Payer: Self-pay

## 2021-11-13 ENCOUNTER — Other Ambulatory Visit (HOSPITAL_COMMUNITY): Payer: Self-pay

## 2021-11-15 ENCOUNTER — Other Ambulatory Visit (HOSPITAL_COMMUNITY): Payer: Self-pay

## 2021-11-19 ENCOUNTER — Other Ambulatory Visit (HOSPITAL_COMMUNITY): Payer: Self-pay

## 2021-12-12 ENCOUNTER — Other Ambulatory Visit (HOSPITAL_COMMUNITY): Payer: Self-pay

## 2021-12-16 ENCOUNTER — Other Ambulatory Visit (HOSPITAL_COMMUNITY): Payer: Self-pay

## 2022-01-02 ENCOUNTER — Other Ambulatory Visit: Payer: Self-pay

## 2022-01-02 ENCOUNTER — Other Ambulatory Visit (HOSPITAL_COMMUNITY)
Admission: RE | Admit: 2022-01-02 | Discharge: 2022-01-02 | Disposition: A | Discharge status: Home / Self Care | Payer: Medicare Other | Source: Ambulatory Visit | Attending: Internal Medicine | Admitting: Internal Medicine

## 2022-01-02 ENCOUNTER — Ambulatory Visit (INDEPENDENT_AMBULATORY_CARE_PROVIDER_SITE_OTHER): Payer: Medicare Other | Admitting: Internal Medicine

## 2022-01-02 VITALS — BP 116/83 | HR 88 | Resp 16 | Ht 64.0 in | Wt 180.0 lb

## 2022-01-02 DIAGNOSIS — B20 Human immunodeficiency virus [HIV] disease: Secondary | ICD-10-CM | POA: Diagnosis not present

## 2022-01-02 DIAGNOSIS — Z91199 Patient's noncompliance with other medical treatment and regimen due to unspecified reason: Secondary | ICD-10-CM

## 2022-01-02 DIAGNOSIS — Z23 Encounter for immunization: Secondary | ICD-10-CM | POA: Diagnosis not present

## 2022-01-02 DIAGNOSIS — B582 Toxoplasma meningoencephalitis: Secondary | ICD-10-CM

## 2022-01-02 NOTE — Progress Notes (Signed)
RFV: follow up for hiv disease,  Patient ID: Kelsey Rodriguez, female   DOB: 29-Nov-1991, 31 y.o.   MRN: [id number]  HPI  31yo F with HIV disease, CNS toxo, advanced hiv disease, poorly controlled. CD 4 count of 51/VL 345,000. Missing 2-3 doses per week.   S/p bilateral -tubo and right ovarian removal --   Restarted on depo but having daily spotting.   Outpatient Encounter Medications as of 01/02/2022  Medication Sig   acetaminophen (TYLENOL) 500 MG tablet Take 500 mg by mouth every 6 (six) hours as needed for mild pain.   bictegravir-emtricitabine-tenofovir AF (BIKTARVY) 50-200-25 MG TABS tablet TAKE 1 TABLET BY MOUTH DAILY.   gabapentin (NEURONTIN) 400 MG capsule Take 400 mg by mouth 3 (three) times daily.   ibuprofen (ADVIL) 800 MG tablet Take 800 mg by mouth 3 (three) times daily as needed.   medroxyPROGESTERone (DEPO-PROVERA) 150 MG/ML injection Inject 150 mg into the muscle every 3 (three) months.   polyethylene glycol (MIRALAX / GLYCOLAX) 17 g packet Take 17 g by mouth daily.   Polyvinyl Alcohol-Povidone (REFRESH OP) Apply 1 drop to eye as needed (dry eyes).   [DISCONTINUED] gabapentin (NEURONTIN) 300 MG capsule Take 1 capsule (300 mg total) by mouth at bedtime.   atovaquone (MEPRON) 750 MG/5ML suspension Take 10 mLs (1,500 mg total) by mouth 2 (two) times daily with a meal. (Patient not taking: Reported on 10/24/2021)   [DISCONTINUED] SUMAtriptan (IMITREX) 50 MG tablet Take 1 tablet (50 mg total) by mouth every 2 (two) hours as needed for migraine (Maximum dose: 100 mg per dose; 200 mg per 24 hours). May repeat in 2 hours if headache persists or recurs.   No facility-administered encounter medications on file as of 01/02/2022.     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   AIDS (acquired immune deficiency syndrome) ([place]) 11/07/2019   Cerebral edema ([place]) 10/28/2019   Midline shift of brain    Pruritus 08/29/2019   Tendinopathy of left shoulder 01/18/2019   Chronic pelvic pain in female 01/04/2019   Lower abdominal pain 06/21/2018   Syphilis 02/26/2018   Tuberculosis    Infertility, female 02/12/2018   ASCUS with positive high risk HPV cervical 09/14/2017   Acute right-sided low back pain with right-sided sciatica 08/24/2017   Complex regional pain syndrome 02/03/2017   Headache 10/28/2016   Avascular necrosis of bone of right hip ([place]) 04/04/2016   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   Avascular necrosis of bone of left hip ([place]) 12/14/2015   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   Nausea 05/18/2015   Vertigo 01/23/2015   Primary adrenal insufficiency ([place]) 01/03/2015   Chest pain 07/07/2013   HIV (human immunodeficiency virus infection) ([place]) 03/16/2012   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 appears well-developed and well-nourished. No  distress.  HENT: [place]/AT, PERRLA, no scleral icterus Mouth/Throat: Oropharynx is clear and moist. No oropharyngeal exudate.  Cardiovascular: Normal rate, regular rhythm and normal heart sounds. Exam reveals no gallop and no friction rub.  No murmur heard.  Pulmonary/Chest: Effort normal and breath sounds normal. No respiratory distress.  has no wheezes.  Neck = supple, no nuchal rigidity Abdominal: Soft. Bowel sounds are normal.  exhibits no distension. There is no tenderness.  Lymphadenopathy: no cervical adenopathy. No axillary adenopathy Neurological: alert and oriented to person, place, and time.  Skin: Skin is warm and dry. No rash noted. No erythema.  Psychiatric: a normal mood and affect.  behavior is normal.   Lab Results  Component Value Date   CD4TCELL 6 (L) 08/27/2021   Lab Results  Component Value Date   CD4TABS 51 (L) 08/27/2021   CD4TABS 136 (L) 03/28/2021   CD4TABS 214 (L) 11/20/2020   Lab Results  Component Value Date   HIV1RNAQUANT 374,000 (H) 08/27/2021   Lab Results  Component Value Date   HEPBSAB POS (A) 09/13/2015   Lab Results  Component Value Date   LABRPR REACTIVE (A) 09/30/2021    CBC Lab Results  Component Value Date   WBC 4.6 10/02/2021   RBC 3.83 10/02/2021   HGB 11.3 (L) 10/02/2021   HCT 34.5 (L) 10/02/2021   PLT 246 10/02/2021   MCV 90.1 10/02/2021   MCH 29.5 10/02/2021   MCHC 32.8 10/02/2021   RDW 17.3 (H) 10/02/2021   LYMPHSABS 1,269 09/30/2021   MONOABS 0.7 04/17/2021   EOSABS 138 09/30/2021    BMET Lab Results  Component Value Date   NA 137 10/02/2021   K 4.2 10/02/2021   CL 105 10/02/2021   CO2 21 10/02/2021   GLUCOSE 67 10/02/2021   BUN 12 10/02/2021   CREATININE 0.93 10/02/2021   CALCIUM 10.1 10/02/2021   GFRNONAA >60 04/25/2021   GFRAA >60 08/30/2020      Assessment and Plan HIV Disease -will check genotype to see if she has gained significant mutations - will do blood work - continue on atovaquone for CNS  toxo -again spent 15 min discussing adherence counseling  Prevnar 20 will be administered today for -health maintenance  Adherence visit in 2 wks

## 2022-01-06 LAB — CBC WITH DIFFERENTIAL/PLATELET
Absolute Monocytes: 540 cells/uL (ref 200–950)
Basophils Absolute: 32 cells/uL (ref 0–200)
Basophils Relative: 0.6 %
Eosinophils Absolute: 400 cells/uL (ref 15–500)
Eosinophils Relative: 7.4 %
HCT: 34.9 % — ABNORMAL LOW (ref 35.0–45.0)
Hemoglobin: 11.7 g/dL (ref 11.7–15.5)
Lymphs Abs: 1890 cells/uL (ref 850–3900)
MCH: 30.9 pg (ref 27.0–33.0)
MCHC: 33.5 g/dL (ref 32.0–36.0)
MCV: 92.1 fL (ref 80.0–100.0)
MPV: 11.3 fL (ref 7.5–12.5)
Monocytes Relative: 10 %
Neutro Abs: 2538 cells/uL (ref 1500–7800)
Neutrophils Relative %: 47 %
Platelets: 296 10*3/uL (ref 140–400)
RBC: 3.79 10*6/uL — ABNORMAL LOW (ref 3.80–5.10)
RDW: 14.3 % (ref 11.0–15.0)
Total Lymphocyte: 35 %
WBC: 5.4 10*3/uL (ref 3.8–10.8)

## 2022-01-06 LAB — URINE CYTOLOGY ANCILLARY ONLY
Chlamydia: NEGATIVE
Comment: NEGATIVE
Comment: NORMAL
Neisseria Gonorrhea: NEGATIVE

## 2022-01-06 LAB — COMPLETE METABOLIC PANEL WITH GFR
AG Ratio: 1.2 (calc) (ref 1.0–2.5)
ALT: 12 U/L (ref 6–29)
AST: 13 U/L (ref 10–30)
Albumin: 4.6 g/dL (ref 3.6–5.1)
Alkaline phosphatase (APISO): 73 U/L (ref 31–125)
BUN/Creatinine Ratio: 13 (calc) (ref 6–22)
BUN: 14 mg/dL (ref 7–25)
CO2: 27 mmol/L (ref 20–32)
Calcium: 9.6 mg/dL (ref 8.6–10.2)
Chloride: 107 mmol/L (ref 98–110)
Creat: 1.07 mg/dL — ABNORMAL HIGH (ref 0.50–0.97)
Globulin: 3.7 g/dL (calc) (ref 1.9–3.7)
Glucose, Bld: 67 mg/dL (ref 65–99)
Potassium: 4.1 mmol/L (ref 3.5–5.3)
Sodium: 139 mmol/L (ref 135–146)
Total Bilirubin: 0.3 mg/dL (ref 0.2–1.2)
Total Protein: 8.3 g/dL — ABNORMAL HIGH (ref 6.1–8.1)
eGFR: 70 mL/min/{1.73_m2} (ref >=60)

## 2022-01-06 LAB — RPR TITER: RPR Titer: 1:2 {titer} — ABNORMAL HIGH

## 2022-01-06 LAB — FLUORESCENT TREPONEMAL AB(FTA)-IGG-BLD: Fluorescent Treponemal ABS: NONREACTIVE

## 2022-01-06 LAB — RPR: RPR Ser Ql: REACTIVE — AB

## 2022-01-06 LAB — T-HELPER CELL (CD4) - (RCID CLINIC ONLY)
CD4 % Helper T Cell: 10 % — ABNORMAL LOW (ref 33–65)
CD4 T Cell Abs: 170 /uL — ABNORMAL LOW (ref 400–1790)

## 2022-01-09 ENCOUNTER — Telehealth: Payer: Self-pay

## 2022-01-09 NOTE — Telephone Encounter (Signed)
Called patient to follow-up on adherence. Patient reported not missing any doses since being seen in clinic last week. Phone alarm has been helpful to remind her to take with dinner. Reinforced the importance of adherence and encouraged her to keep up the good work.

## 2022-01-13 ENCOUNTER — Other Ambulatory Visit (HOSPITAL_COMMUNITY): Payer: Self-pay

## 2022-01-14 ENCOUNTER — Other Ambulatory Visit (HOSPITAL_COMMUNITY): Payer: Self-pay

## 2022-01-27 ENCOUNTER — Telehealth: Payer: Self-pay | Admitting: Pharmacist

## 2022-01-27 NOTE — Telephone Encounter (Signed)
[place] placed in chart under media section.   Based on [place], patient has low level resistance to atazanavir and lopinavir. No other drug related mutations.   Kelsey Rodriguez, PharmD [facility] Clinical Pharmacist Practitioner

## 2022-02-04 ENCOUNTER — Other Ambulatory Visit (HOSPITAL_COMMUNITY): Payer: Self-pay

## 2022-02-06 ENCOUNTER — Other Ambulatory Visit (HOSPITAL_COMMUNITY): Payer: Self-pay

## 2022-02-17 ENCOUNTER — Encounter: Payer: Self-pay | Admitting: Internal Medicine

## 2022-02-17 ENCOUNTER — Ambulatory Visit (INDEPENDENT_AMBULATORY_CARE_PROVIDER_SITE_OTHER): Payer: Medicare Other | Admitting: Internal Medicine

## 2022-02-17 ENCOUNTER — Other Ambulatory Visit: Payer: Self-pay

## 2022-02-17 DIAGNOSIS — R21 Rash and other nonspecific skin eruption: Secondary | ICD-10-CM | POA: Diagnosis not present

## 2022-02-17 DIAGNOSIS — K5900 Constipation, unspecified: Secondary | ICD-10-CM

## 2022-02-17 DIAGNOSIS — B582 Toxoplasma meningoencephalitis: Secondary | ICD-10-CM | POA: Diagnosis not present

## 2022-02-17 DIAGNOSIS — B20 Human immunodeficiency virus [HIV] disease: Secondary | ICD-10-CM

## 2022-02-17 MED ORDER — LACTULOSE 10 G PO PACK: 10.0000 g | PACK | Freq: Three times a day (TID) | ORAL | 1 refills | Status: DC

## 2022-02-17 MED ORDER — ATOVAQUONE 750 MG/5ML PO SUSP: 1500.0000 mg | Freq: Two times a day (BID) | ORAL | 11 refills | Status: DC

## 2022-02-17 NOTE — Progress Notes (Signed)
Virtual Visit via Telephone Note ? ?I connected with Kelsey Rodriguez on 02/17/22 at  9:45 AM EST by telephone and verified that I am speaking with the correct person using two identifiers. ? ?Location: ?Patient: at home ?Provider: in clinic  ?  ?I discussed the limitations, risks, security and privacy concerns of performing an evaluation and management service by telephone and the availability of in person appointments. I also discussed with the patient that there may be a patient responsible charge related to this service. The patient expressed understanding and agreed to proceed. ? ? ?History of Present Illness: ?31yo F with hx of poorly controlled hiv disease. Missed 6 days of meds due to going on vacation but now taking medicines for the last 7 wks. ? ?ROS: ?Constipation ?Headache- intermittent -taking occ ibuprofen ? ?HIV GENOTYPE: ?K101K/R, L10V, K20R, M36I, M46M/I, E35E/D, L89M, T69D/N ?  ?Observations/Objective: ?Fluid speech ? ?Assessment and Plan: ?Constipation = miralx bid-tid has not worked. Will do a trial of Lactulose PLUSDoculax every day ?Hiv = she does not have NNRTI nor NRTI nor INI resistance surprisingly . Continue on biktarvy plus needs to come in for vl and cd 4 count ?Rash- will need in person ? ?Hx of cns toxo = not taking mepron presently. CD 4 count still < 200 ? ?Follow Up Instructions: see above ?Will refill mepron to give oi proph and cns toxo ?Gave refills on biktarvy ?Rx sent for lactulose ?Hiv Labs for future order in place ? ? ?  ?I discussed the assessment and treatment plan with the patient. The patient was provided an opportunity to ask questions and all were answered. The patient agreed with the plan and demonstrated an understanding of the instructions. ?  ?The patient was advised to call back or seek an in-person evaluation if the symptoms worsen or if the condition fails to improve as anticipated. ? ?I provided 10 minutes of non-face-to-face time during this  encounter. ? ? ?Carlyle Basques, MD  ?

## 2022-02-26 ENCOUNTER — Telehealth: Payer: Self-pay

## 2022-02-26 NOTE — Telephone Encounter (Signed)
Received notification from [facility] that PA for Lactulose was incomplete. Completed requested form and faxed to [facility]. Awaiting response.  ? ?P:[phone] ?Ref # [id number] ? ?Beryle Flock, RN ? ?

## 2022-02-28 ENCOUNTER — Encounter: Payer: Self-pay | Admitting: Infectious Diseases

## 2022-02-28 ENCOUNTER — Other Ambulatory Visit (HOSPITAL_COMMUNITY): Payer: Self-pay

## 2022-02-28 ENCOUNTER — Ambulatory Visit (INDEPENDENT_AMBULATORY_CARE_PROVIDER_SITE_OTHER): Payer: Medicare Other | Admitting: Infectious Diseases

## 2022-02-28 ENCOUNTER — Other Ambulatory Visit: Payer: Self-pay

## 2022-02-28 VITALS — BP 124/84 | HR 94 | Temp 97.1°F | Wt 182.0 lb

## 2022-02-28 DIAGNOSIS — L309 Dermatitis, unspecified: Secondary | ICD-10-CM

## 2022-02-28 DIAGNOSIS — B582 Toxoplasma meningoencephalitis: Secondary | ICD-10-CM | POA: Diagnosis not present

## 2022-02-28 DIAGNOSIS — B20 Human immunodeficiency virus [HIV] disease: Secondary | ICD-10-CM

## 2022-02-28 HISTORY — DX: Dermatitis, unspecified: L30.9

## 2022-02-28 MED ORDER — TRIAMCINOLONE ACETONIDE 0.5 % EX OINT: 1.0000 "application " | TOPICAL_OINTMENT | Freq: Two times a day (BID) | CUTANEOUS | 1 refills | Status: DC

## 2022-02-28 NOTE — Progress Notes (Signed)
? ?Name: Kelsey Rodriguez  ?DOB: Jun 19, 1991 ?MRN: [id number] ?PCP: Gerlene Fee, DO  ? ? ? ?Subjective:  ? ?Chief Complaint  ?Patient presents with  ? Follow-up  ?  Rash on both shoulders for 3 wks or more  ?  ? ?HPI: ?Kelsey Rodriguez is a 31 y.o. female with HIV/AIDS and secondary toxoplasmosis CNS infection.  ? ?Virtual visit with Dr. Baxter Flattery recently with concern over a rash. This started 3 weeks ago and she just noticed some dry itching on the back of her right shoulder. She looked in the mirror and sees it is pretty cracked and darker pigmented. Worried it is spreading down her shoulder a bit. Has been using OTC lotions and oils to help.  ? ?She is taking biktarvy for her HIV treatment. Fill history from [facility] reveals adherent fill history. She says she is really trying hard for her health now.  ? ?Pap smear last with (+) HR HPV and LSIL in 10/2020 - referred for colposcopy. 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 Has not had a follow up pap smear since.  ? ? ? ?Depression screen [facility] 2/9 02/17/2022  ?Decreased Interest 0  ?Down, Depressed, Hopeless 0  ?PHQ - 2 Score 0  ?Altered sleeping -  ?Tired, decreased energy -  ?Change in appetite -  ?Feeling bad or failure about yourself  -  ?Trouble concentrating -  ?Moving slowly or fidgety/restless -  ?Suicidal thoughts -  ?PHQ-9 Score -  ?Difficult doing work/chores -  ?Some recent data might be hidden  ? ? ?Review of Systems  ?Constitutional:  Negative for chills and fever.  ?HENT:  Negative for tinnitus.   ?Eyes:  Negative for blurred vision and photophobia.  ?Respiratory:  Negative for cough and sputum production.   ?Cardiovascular:  Negative for chest pain.  ?Gastrointestinal:  Negative for diarrhea, nausea and vomiting.  ?Genitourinary:  Negative for dysuria.  ?Skin:  Positive for itching and rash.  ?Neurological:  Negative for headaches.  ? ? ?Past Medical History:  ?Diagnosis Date  ? 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 Adrenal insufficiency  ([place])   ? Anemia of chronic disease 03/11/2012  ? Back pain of lumbar region with sciatica 02/12/2015  ? Bell's palsy 08/26/2013  ? Brain lesion   ? [place] 05/30/2012  ? Chronic back pain   ? Chronic leg pain   ? bilateral knees, ankles  ? Depression   ? Fatigue   ? GERD (gastroesophageal reflux disease)   ? Headache   ? 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 HIV (human immunodeficiency virus infection) ([place]) 02/2012  ? Laceration of ankle, right 11/18/2012  ? Lumbar radiculopathy   ? Meningitis 02/18/2018  ? Pelvic pain   ? PID (acute pelvic inflammatory disease) 02/26/2018  ? Pneumonia   ? Reflux esophagitis 03/11/2012  ? Seizure ([place])   ? Tuberculosis   ? 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 Vertigo   ? Wears glasses   ? ? ?Outpatient Medications Prior to Visit  ?Medication Sig Dispense Refill  ? acetaminophen (TYLENOL) 500 MG tablet Take 500 mg by mouth every 6 (six) hours as needed for mild pain.    ? atovaquone (MEPRON) 750 MG/5ML suspension Take 10 mLs (1,500 mg total) by mouth 2 (two) times daily with a meal. 600 mL 11  ? bictegravir-emtricitabine-tenofovir AF (BIKTARVY) 50-200-25 MG TABS tablet TAKE 1 TABLET BY MOUTH DAILY. 30 tablet 11  ? gabapentin (NEURONTIN) 400 MG capsule Take 400 mg by mouth 3 (  three) times daily.    ? ibuprofen (ADVIL) 800 MG tablet Take 800 mg by mouth 3 (three) times daily as needed.    ? lactulose (CEPHULAC) 10 g packet Take 1 packet (10 g total) by mouth 3 (three) times daily. 30 each 1  ? medroxyPROGESTERone (DEPO-PROVERA) 150 MG/ML injection Inject 150 mg into the muscle every 3 (three) months.    ? polyethylene glycol (MIRALAX / GLYCOLAX) 17 g packet Take 17 g by mouth daily.    ? Polyvinyl Alcohol-Povidone (REFRESH OP) Apply 1 drop to eye as needed (dry eyes).    ? ?No facility-administered medications prior to visit.  ?  ? ?Allergies  ?Allergen Reactions  ? Hydrocodone Itching and Nausea Only  ?  Tolerates Oxycodone  ? Tramadol Itching and Nausea Only  ?  Tolerates  oxycodone  ? ? ?Social History  ? ?Tobacco Use  ? Smoking status: Never  ? Smokeless tobacco: Never  ?Vaping Use  ? Vaping Use: Never used  ?Substance Use Topics  ? Alcohol use: Not Currently  ?  Comment: socially  ? Drug use: No  ? ? ?Family History  ?Problem Relation Age of Onset  ? Heart disease Father   ?     Vague not clearly cardiac  ? Hypertension Mother   ? ? ?Social History  ? ?Substance and Sexual Activity  ?Sexual Activity Yes  ? Partners: Male  ? Birth control/protection: None  ? ? ? ?Objective:  ? ?Vitals:  ? 02/28/22 1136  ?BP: 124/84  ?Pulse: 94  ?Temp: (!) 97.1 ?F (36.2 ?C)  ?TempSrc: Temporal  ?Weight: 182 lb (82.6 kg)  ? ?Body mass index is 31.24 kg/m?. ? ?Physical Exam ?Vitals reviewed.  ?Constitutional:   ?   Appearance: She is well-developed.  ?   Comments: Seated comfortably in chair.   ?HENT:  ?   Mouth/Throat:  ?   Mouth: No oral lesions.  ?   Dentition: Normal dentition. No dental abscesses.  ?   Pharynx: No oropharyngeal exudate.  ?Cardiovascular:  ?   Rate and Rhythm: Normal rate and regular rhythm.  ?   Heart sounds: Normal heart sounds.  ?Pulmonary:  ?   Effort: Pulmonary effort is normal.  ?   Breath sounds: Normal breath sounds.  ?Abdominal:  ?   General: There is no distension.  ?   Palpations: Abdomen is soft.  ?   Tenderness: There is no abdominal tenderness.  ?Lymphadenopathy:  ?   Cervical: No cervical adenopathy.  ?Skin: ?   General: Skin is warm and dry.  ?   Findings: No rash.  ?   Comments: Right shoulder with flat, dry appearing skin over posterior shoulder and down lateral deltoid. No open areas. No draining areas.   ?Neurological:  ?   Mental Status: She is alert and oriented to person, place, and time.  ?Psychiatric:     ?   Judgment: Judgment normal.  ?   Comments: In good spirits today and engaged in care discussion  ? ? ? ? ?Lab Results ?Lab Results  ?Component Value Date  ? WBC 5.4 01/02/2022  ? HGB 11.7 01/02/2022  ? HCT 34.9 (L) 01/02/2022  ? MCV 92.1 01/02/2022  ?  PLT 296 01/02/2022  ?  ?Lab Results  ?Component Value Date  ? CREATININE 1.07 (H) 01/02/2022  ? BUN 14 01/02/2022  ? NA 139 01/02/2022  ? K 4.1 01/02/2022  ? CL 107 01/02/2022  ? CO2 27 01/02/2022  ?  ?  Lab Results  ?Component Value Date  ? ALT 12 01/02/2022  ? AST 13 01/02/2022  ? ALKPHOS 112 04/25/2021  ? BILITOT 0.3 01/02/2022  ?  ?Lab Results  ?Component Value Date  ? CHOL 252 (H) 10/02/2021  ? HDL 75 10/02/2021  ? [place] 155 (H) 10/02/2021  ? TRIG 106 10/02/2021  ? CHOLHDL 3.4 10/02/2021  ? ?HIV 1 RNA Quant  ?Date Value  ?08/27/2021 374,000 copies/mL (H)  ?03/28/2021 648 copies/mL (H)  ?11/20/2020 61,500 Copies/mL (H)  ? ?CD4 T Cell Abs (/uL)  ?Date Value  ?01/02/2022 170 (L)  ?08/27/2021 51 (L)  ?03/28/2021 136 (L)  ? ? ? ?Assessment & Plan:  ? ?Problem List Items Addressed This Visit   ? ?  ? High  ? AIDS (acquired immune deficiency syndrome) ([place])  ?  Routine labs for Dr. Baxter Flattery to be drawn today.  ?  ?  ? 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  Continue secondary prophylaxis with mepron. Hopeful her CD4 continues to increase with her significantly improved adherence.  ?  ?  ?  ? Unprioritized  ? Eczema - Primary  ?  Rash most c/w atopic dermatitis. Will rx topical steroid for BID use 1-2 weeks.  ?Other care recommendations discussed including OTC topical lotions.  ?Can use antihistamines OTC to control the itching if needed.  ?  ?  ? Relevant Medications  ? triamcinolone ointment (KENALOG) 0.5 %  ? ?She is due for pap smear - will have her schedule with me sometime at her convenience over the next month.  ? ? ?Janene Madeira, MSN, NP-C ?[facility] ?[facility] ?Pager: [phone] ?Office: [phone] ? ?02/28/22  ?12:17 PM ? ? ?

## 2022-02-28 NOTE — Patient Instructions (Addendum)
Please schedule a follow up appointment for a pap smear with me sometime in the next 3 weeks - we may need to refer you back to gynecology for a colposcopy.  ?If you prefer we can refer you to a gynecology team here in [place] to make transportation easier.  ? ?Please stop by the lab on your way out.  ? ?For the rash - nothing concerning and no infection. I think a steroid cream will help calm it down with some good thick lotion.  ? ?Eucerine (or the generic), Aveeno Eczema, or Cetaphil are my favorite go toos for these rashes.  ? ?The steroid cream is called Triamcinolone - use this twice a day to your rash for 1-2 weeks. If you use it too long it can cause your skin to have a lighter color.  ?

## 2022-02-28 NOTE — Assessment & Plan Note (Signed)
Rash most c/w atopic dermatitis. Will rx topical steroid for BID use 1-2 weeks.  ?Other care recommendations discussed including OTC topical lotions.  ?Can use antihistamines OTC to control the itching if needed.  ?

## 2022-02-28 NOTE — Assessment & Plan Note (Signed)
Routine labs for Dr. Baxter Flattery to be drawn today.  ?

## 2022-02-28 NOTE — Assessment & Plan Note (Signed)
Continue secondary prophylaxis with mepron. Hopeful her CD4 continues to increase with her significantly improved adherence.  ?

## 2022-03-03 ENCOUNTER — Other Ambulatory Visit (HOSPITAL_COMMUNITY): Payer: Self-pay

## 2022-03-03 LAB — T-HELPER CELLS (CD4) COUNT (NOT AT ARMC)
Absolute CD4: 252 cells/uL — ABNORMAL LOW (ref 490–1740)
CD4 T Helper %: 15 % — ABNORMAL LOW (ref 30–61)
Total lymphocyte count: 1728 cells/uL (ref 850–3900)

## 2022-03-03 LAB — CBC WITH DIFFERENTIAL/PLATELET
Absolute Monocytes: 510 cells/uL (ref 200–950)
Basophils Absolute: 41 cells/uL (ref 0–200)
Basophils Relative: 0.8 %
Eosinophils Absolute: 551 cells/uL — ABNORMAL HIGH (ref 15–500)
Eosinophils Relative: 10.8 %
HCT: 37 % (ref 35.0–45.0)
Hemoglobin: 12.2 g/dL (ref 11.7–15.5)
Lymphs Abs: 1754 cells/uL (ref 850–3900)
MCH: 30.6 pg (ref 27.0–33.0)
MCHC: 33 g/dL (ref 32.0–36.0)
MCV: 92.7 fL (ref 80.0–100.0)
MPV: 11.9 fL (ref 7.5–12.5)
Monocytes Relative: 10 %
Neutro Abs: 2244 cells/uL (ref 1500–7800)
Neutrophils Relative %: 44 %
Platelets: 267 10*3/uL (ref 140–400)
RBC: 3.99 10*6/uL (ref 3.80–5.10)
RDW: 15.4 % — ABNORMAL HIGH (ref 11.0–15.0)
Total Lymphocyte: 34.4 %
WBC: 5.1 10*3/uL (ref 3.8–10.8)

## 2022-03-03 LAB — COMPLETE METABOLIC PANEL WITH GFR
AG Ratio: 1.5 (calc) (ref 1.0–2.5)
ALT: 13 U/L (ref 6–29)
AST: 17 U/L (ref 10–30)
Albumin: 4.7 g/dL (ref 3.6–5.1)
Alkaline phosphatase (APISO): 71 U/L (ref 31–125)
BUN: 14 mg/dL (ref 7–25)
CO2: 27 mmol/L (ref 20–32)
Calcium: 9.5 mg/dL (ref 8.6–10.2)
Chloride: 105 mmol/L (ref 98–110)
Creat: 0.77 mg/dL (ref 0.50–0.97)
Globulin: 3.2 g/dL (calc) (ref 1.9–3.7)
Glucose, Bld: 79 mg/dL (ref 65–99)
Potassium: 3.9 mmol/L (ref 3.5–5.3)
Sodium: 139 mmol/L (ref 135–146)
Total Bilirubin: 0.3 mg/dL (ref 0.2–1.2)
Total Protein: 7.9 g/dL (ref 6.1–8.1)
eGFR: 104 mL/min/{1.73_m2} (ref >=60)

## 2022-03-03 LAB — HIV-1 RNA QUANT-NO REFLEX-BLD
HIV 1 RNA Quant: 21 Copies/mL — ABNORMAL HIGH
HIV-1 RNA Quant, Log: 1.33 Log cps/mL — ABNORMAL HIGH

## 2022-03-03 LAB — RPR: RPR Ser Ql: REACTIVE — AB

## 2022-03-03 LAB — FLUORESCENT TREPONEMAL AB(FTA)-IGG-BLD: Fluorescent Treponemal ABS: NONREACTIVE

## 2022-03-03 LAB — RPR TITER: RPR Titer: 1:1 {titer} — ABNORMAL HIGH

## 2022-03-17 ENCOUNTER — Other Ambulatory Visit: Payer: Self-pay

## 2022-03-17 ENCOUNTER — Encounter (INDEPENDENT_AMBULATORY_CARE_PROVIDER_SITE_OTHER): Payer: Medicare Other | Admitting: *Deleted

## 2022-03-17 VITALS — BP 128/86 | HR 90 | Temp 98.5°F | Ht 64.0 in | Wt 182.1 lb

## 2022-03-17 DIAGNOSIS — Z006 Encounter for examination for normal comparison and control in clinical research program: Secondary | ICD-10-CM

## 2022-03-17 NOTE — Research (Signed)
Kelsey Rodriguez came in today to screen for the A5359 study. Informed consent was obtained after she had time to review the consent at home and we went over it together today. We were able to answer all her questions about it. She has had many years of not being adherent with her HIV meds, mainly due to inability to swallow large pills and depression. She says that if she is feeling down, she doesn't want to take them or even think about it. She dies have a support person (her cousin) who has helped her with taking them. She finds that she is able to swallow the Biktarvy easier than other pills and wants to remain on that for now. Her last viral load was 21, which was the best it had been in a long time. She is taking mepron for toxoplasmosis and knows she needs to continue that until her health is improved. She denies any current problems. We will plan on entry for 2 weeks as long as she remains eligible for study. ?

## 2022-03-18 ENCOUNTER — Encounter: Payer: Self-pay | Admitting: Infectious Diseases

## 2022-03-18 ENCOUNTER — Other Ambulatory Visit (HOSPITAL_COMMUNITY)
Admission: RE | Admit: 2022-03-18 | Discharge: 2022-03-18 | Disposition: A | Discharge status: Home / Self Care | Payer: Medicare Other | Source: Ambulatory Visit | Attending: Infectious Diseases | Admitting: Infectious Diseases

## 2022-03-18 ENCOUNTER — Ambulatory Visit (INDEPENDENT_AMBULATORY_CARE_PROVIDER_SITE_OTHER): Payer: Medicare Other | Admitting: Infectious Diseases

## 2022-03-18 ENCOUNTER — Other Ambulatory Visit: Payer: Self-pay

## 2022-03-18 VITALS — BP 122/85 | HR 88 | Temp 98.3°F | Wt 182.0 lb

## 2022-03-18 DIAGNOSIS — Z01419 Encounter for gynecological examination (general) (routine) without abnormal findings: Secondary | ICD-10-CM | POA: Diagnosis present

## 2022-03-18 DIAGNOSIS — Z1151 Encounter for screening for human papillomavirus (HPV): Secondary | ICD-10-CM | POA: Insufficient documentation

## 2022-03-18 DIAGNOSIS — Z309 Encounter for contraceptive management, unspecified: Secondary | ICD-10-CM | POA: Diagnosis not present

## 2022-03-18 DIAGNOSIS — Z124 Encounter for screening for malignant neoplasm of cervix: Secondary | ICD-10-CM | POA: Insufficient documentation

## 2022-03-18 LAB — URINALYSIS, ROUTINE W REFLEX MICROSCOPIC
Bilirubin Urine: NEGATIVE
Glucose, UA: NEGATIVE
Hyaline Cast: NONE SEEN /LPF
Ketones, ur: NEGATIVE
Leukocytes,Ua: NEGATIVE
Nitrite: NEGATIVE
Protein, ur: NEGATIVE
Specific Gravity, Urine: 1.024 (ref 1.001–1.035)
WBC, UA: NONE SEEN /HPF (ref 0–5)
pH: 5.5 (ref 5.0–8.0)

## 2022-03-18 LAB — MICROSCOPIC MESSAGE

## 2022-03-18 NOTE — Assessment & Plan Note (Signed)
Normal pelvic exam.  ?Cervical brushing collected for cytology and HPV.  ?Discussed recommended screening interval for women living with HIV disease meant to be lifelong and at an interval of Q1-3 years pending results. Acceptable to space out Q3y with 3 consecutively normal exams. Further recommendations for Kelsey Rodriguez to follow today's results.  ?Results will be communicated to the patient via telephone call.   ? ?

## 2022-03-18 NOTE — Progress Notes (Signed)
? ?  ? ?Subjective :  ? ? Kelsey Rodriguez is a 31 y.o. female here for an annual pelvic exam and pap smear.  ? ?Review of Systems: ?Current GYN complaints or concerns: none ?She went to the doctor yesterday and showed me a urinalysis that was abnormal. She does not have any dysuria but she has a hard time holding her urine too long before she has urgency/near incontinence.  ?She is also interested in contraception discussion - she previously was on the depo shot, now on her 3rd attempt and she cannot tolerate the breakthrough bleeding she gets on this. Would prefer not to do any pills for management.  ? ?She does have a history of abnormal pap smears with (+) HPV with LSIL requiring colposcopy in the past (2021). GYN team is in [place] and too far to travel to.  ? ?Patient denies any abdominal/pelvic pain, problems with bowel movements, urination, vaginal discharge or intercourse.  ? ?Past Medical History:  ?Diagnosis Date  ? 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 Adrenal insufficiency ([place])   ? Anemia of chronic disease 03/11/2012  ? Back pain of lumbar region with sciatica 02/12/2015  ? Bell's palsy 08/26/2013  ? Brain lesion   ? Bullae 05/30/2012  ? Chronic back pain   ? Chronic leg pain   ? bilateral knees, ankles  ? Depression   ? Fatigue   ? GERD (gastroesophageal reflux disease)   ? Headache   ? 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 HIV (human immunodeficiency virus infection) ([place]) 02/2012  ? Laceration of ankle, right 11/18/2012  ? Lumbar radiculopathy   ? Meningitis 02/18/2018  ? Pelvic pain   ? PID (acute pelvic inflammatory disease) 02/26/2018  ? Pneumonia   ? Reflux esophagitis 03/11/2012  ? Seizure ([place])   ? Tuberculosis   ? 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 Vertigo   ? Wears glasses   ? ? ?Gynecologic History: ?G1P0010  ?Patient's last menstrual period was 05/28/2021 (approximate). ?Contraception: condoms ?Last Pap: 2021. Results were: abnormal LSIL, (+) HPV  ?Anal  Intercourse:  ?Last Mammogram: not due.  ? ? ? ?Objective :   ?Physical Exam - chaperone present  ?Constitutional: Well developed, well nourished, no acute distress. She is alert and oriented x3.  ?Pelvic: External genitalia is normal in appearance. The vagina is normal in appearance. The cervix is bulbous and easily visualized. No CMT, normal expected cervical mucus present. Very tight pelvic floor musculature.  ?Psych: She has a normal mood and affect.   ? ? ? ?Assessment & Plan:   ? ?Patient Active Problem List  ? Diagnosis Date Noted  ? 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 AIDS (acquired immune deficiency syndrome) ([place]) 11/07/2019  ? HIV (human immunodeficiency virus infection) ([place]) 03/16/2012  ? Screening for cervical cancer 03/18/2022  ? Eczema 02/28/2022  ? Tubo-ovarian abscess 04/25/2021  ? TOA (tubo-ovarian abscess) 04/15/2021  ? Cervical dysplasia, mild 12/06/2020  ? Encounter for contraceptive management 11/01/2020  ? STD exposure 11/01/2020  ? S/P craniotomy 05/16/2020  ? Brain tumor ([place]) 05/16/2020  ? Headache due to intracranial disease 05/09/2020  ? Hypertension   ? Pain of upper abdomen   ? Suicide ideation   ? 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 Seizure ([place])   ? 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 Rotator cuff strain 01/26/2020  ? Cerebral edema ([place]) 10/28/2019  ? Pruritus 08/29/2019  ? Tendinopathy of  left shoulder 01/18/2019  ? Chronic pelvic pain in female 01/04/2019  ? Lower abdominal pain 06/21/2018  ? Syphilis 02/26/2018  ? Tuberculosis   ? ASCUS with positive high risk HPV cervical 09/14/2017  ? Acute right-sided low back pain with right-sided sciatica 08/24/2017  ? Complex regional pain syndrome 02/03/2017  ? Headache 10/28/2016  ? 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 Avascular necrosis of bone of left hip ([place]) 12/14/2015  ?  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  ? Vertigo 01/23/2015  ? Primary adrenal insufficiency ([place]) 01/03/2015  ? Chest pain 07/07/2013  ? 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 ? ?Problem List Items Addressed This Visit   ? ?  ? Unprioritized  ? Encounter for contraceptive management  ?  Discussed contraception options with Kelsey Rodriguez today - desires long acting (or non-pill) option. She does not like the breakthrough bleeding she experienced on Depo injections. ? ?She does have a history of headaches but these are much improved after CNS toxo treatment and she no longer has mass effect from the lesion. I think she would be an acceptable estrogen candidate as part of contraception regimen. Not a fan of discussing the ring, but patches she was open to.  ? ?She was most interested in IUD placement - will refer to GYN locally for consideration.  ?  ?  ? Relevant Orders  ? Ambulatory referral to Gynecology  ? Screening for cervical cancer - Primary  ?  Normal pelvic exam.  ?Cervical brushing collected for cytology and HPV.  ?Discussed recommended screening interval for women living with HIV disease meant to be lifelong and at an interval of Q1-3 years pending results. Acceptable to space out Q3y with 3 consecutively normal exams. Further recommendations for Kelsey Rodriguez to follow today's results.  ?Results will be communicated to the patient via telephone call.   ? ?  ?  ? Relevant Orders  ? Cytology - PAP( [place])  ? Ambulatory referral to Gynecology  ?  ? ? ?Janene Madeira, MSN, NP-C ?[facility] ?[facility] ?Office: [phone] ?Pager: [phone] ? ?03/18/22 ?12:08 PM ?   ? ?

## 2022-03-18 NOTE — Assessment & Plan Note (Signed)
Discussed contraception options with Kelsey Rodriguez today - desires long acting (or non-pill) option. She does not like the breakthrough bleeding she experienced on Depo injections. ? ?She does have a history of headaches but these are much improved after CNS toxo treatment and she no longer has mass effect from the lesion. I think she would be an acceptable estrogen candidate as part of contraception regimen. Not a fan of discussing the ring, but patches she was open to.  ? ?She was most interested in IUD placement - will refer to GYN locally for consideration.  ?

## 2022-03-18 NOTE — Patient Instructions (Signed)
Will refer you to gynecology team at the [facility] to discuss about possible pregnancy prevention for you and to review your pap smear if it comes back abnormal again.  ? ?The Intra-Uterine Device (IUD) may be something good for you that you can leave in place for 5-10 years depending on the type to prevent pregnancy.  ?

## 2022-03-20 LAB — CYTOLOGY - PAP
Chlamydia: NEGATIVE
Comment: NEGATIVE
Comment: NEGATIVE
Comment: NEGATIVE
Comment: NEGATIVE
Comment: NEGATIVE
Comment: NORMAL
HPV 16: NEGATIVE
HPV 18 / 45: NEGATIVE
High risk HPV: POSITIVE — AB
Neisseria Gonorrhea: NEGATIVE
Trichomonas: NEGATIVE

## 2022-03-25 ENCOUNTER — Other Ambulatory Visit (HOSPITAL_COMMUNITY): Payer: Self-pay

## 2022-03-27 ENCOUNTER — Ambulatory Visit (INDEPENDENT_AMBULATORY_CARE_PROVIDER_SITE_OTHER): Payer: Medicare Other | Admitting: Orthopaedic Surgery

## 2022-03-27 ENCOUNTER — Other Ambulatory Visit: Payer: Self-pay

## 2022-03-27 ENCOUNTER — Other Ambulatory Visit (HOSPITAL_COMMUNITY): Payer: Self-pay

## 2022-03-27 ENCOUNTER — Encounter: Payer: Self-pay | Admitting: Orthopaedic Surgery

## 2022-03-27 DIAGNOSIS — M25551 Pain in right hip: Secondary | ICD-10-CM | POA: Diagnosis not present

## 2022-03-27 DIAGNOSIS — Z96641 Presence of right artificial hip joint: Secondary | ICD-10-CM | POA: Diagnosis not present

## 2022-03-27 NOTE — Progress Notes (Signed)
The patient is a 31 year old female well-known to me.  We replaced her right hip in 2017 and her left hip sometime before that.  It was secondary to avascular necrosis.  She is continue to complain of right hip pain mainly with exercises and somewhat driving.  Its been in the anterior aspect of her right hip.  We have had a MRI of the right hip since surgery which did not show any complicating features.  Recent x-rays a few months ago showed an no complicating features of the hardware. ? ?On exam she still hurts consistently in the anterior aspect of her right hip.  I can cause this pain more with hip flexion than rotation.  I reviewed all her previous imaging studies.  The plain films and even the CT scan that showed her abdomen and pelvis showed no overhang of the acetabular component and is well-seated so I do not see any specific irritation of the hip flexor tendons. ? ?I would like to send her to Dr. Ernestina Patches at this standpoint to consider a steroid injection under fluoroscopy of the right hip most likely in the proximity of the iliopsoas tendon and anterior hip to see if this will help alleviate her symptoms and can help with diagnosis as well.  She agrees with this treatment plan.  Once that injection is performed I would like to see her back about 2 weeks after that injection. ?

## 2022-04-01 ENCOUNTER — Encounter: Payer: Medicare Other | Admitting: *Deleted

## 2022-04-04 ENCOUNTER — Encounter: Payer: Medicare Other | Admitting: *Deleted

## 2022-04-05 LAB — COMPREHENSIVE METABOLIC PANEL
AG Ratio: 1.1 (calc) (ref 1.0–2.5)
ALT: 16 U/L (ref 6–29)
AST: 18 U/L (ref 10–30)
Albumin: 4.5 g/dL (ref 3.6–5.1)
Alkaline phosphatase (APISO): 82 U/L (ref 31–125)
BUN: 12 mg/dL (ref 7–25)
CO2: 23 mmol/L (ref 20–32)
Calcium: 9.9 mg/dL (ref 8.6–10.2)
Chloride: 104 mmol/L (ref 98–110)
Creat: 0.81 mg/dL (ref 0.50–0.97)
Globulin: 4 g/dL (calc) — ABNORMAL HIGH (ref 1.9–3.7)
Glucose, Bld: 75 mg/dL (ref 65–99)
Potassium: 4.5 mmol/L (ref 3.5–5.3)
Sodium: 138 mmol/L (ref 135–146)
Total Bilirubin: 0.3 mg/dL (ref 0.2–1.2)
Total Protein: 8.5 g/dL — ABNORMAL HIGH (ref 6.1–8.1)

## 2022-04-05 LAB — HIV RNA, RTPCR W/R GT (RTI, PI,INT)
HIV 1 RNA Quant: 237000 copies/mL — ABNORMAL HIGH
HIV-1 RNA Quant, Log: 5.37 Log copies/mL — ABNORMAL HIGH

## 2022-04-05 LAB — HEPATITIS C ANTIBODY
Hepatitis C Ab: NONREACTIVE
SIGNAL TO CUT-OFF: 0.1 (ref <1.00)

## 2022-04-05 LAB — HIV-1 GENOTYPE: HIV-1 Genotype: DETECTED — AB

## 2022-04-05 LAB — HIV-1 INTEGRASE GENOTYPE

## 2022-04-05 LAB — BILIRUBIN, DIRECT: Bilirubin, Direct: 0 mg/dL (ref 0.0–0.2)

## 2022-04-09 ENCOUNTER — Other Ambulatory Visit: Payer: Self-pay

## 2022-04-09 ENCOUNTER — Encounter (INDEPENDENT_AMBULATORY_CARE_PROVIDER_SITE_OTHER): Payer: Medicare Other | Admitting: *Deleted

## 2022-04-09 VITALS — BP 123/85 | HR 79 | Temp 98.5°F | Wt 183.1 lb

## 2022-04-09 DIAGNOSIS — Z006 Encounter for examination for normal comparison and control in clinical research program: Secondary | ICD-10-CM

## 2022-04-09 NOTE — Research (Signed)
Kelsey Rodriguez was here today to enroll in the [employer] study. She was enrolled after eligibility was confirmed. There were no new problems or medications since screening. She will continue to take her Biktarvy through step 1 and hopefully be able to get her VL down under 200 within the next 20 weeks. When that occurs she will move to the next step of the study and have a 50% chance of moving to monthly injections. She will return in 2 weeks for the next visit. ?

## 2022-04-11 ENCOUNTER — Other Ambulatory Visit: Payer: Self-pay

## 2022-04-11 ENCOUNTER — Inpatient Hospital Stay (HOSPITAL_COMMUNITY)
Admission: EM | Admit: 2022-04-11 | Discharge: 2022-04-17 | DRG: 100 | Disposition: A | Discharge status: Home Health | Payer: Medicare Other | Attending: Family Medicine | Admitting: Family Medicine

## 2022-04-11 ENCOUNTER — Emergency Department (HOSPITAL_COMMUNITY): Payer: Medicare Other

## 2022-04-11 ENCOUNTER — Observation Stay (HOSPITAL_COMMUNITY): Payer: Medicare Other

## 2022-04-11 DIAGNOSIS — F322 Major depressive disorder, single episode, severe without psychotic features: Secondary | ICD-10-CM | POA: Diagnosis present

## 2022-04-11 DIAGNOSIS — G40909 Epilepsy, unspecified, not intractable, without status epilepticus: Principal | ICD-10-CM | POA: Diagnosis present

## 2022-04-11 DIAGNOSIS — R519 Headache, unspecified: Secondary | ICD-10-CM | POA: Diagnosis present

## 2022-04-11 DIAGNOSIS — I10 Essential (primary) hypertension: Secondary | ICD-10-CM | POA: Diagnosis present

## 2022-04-11 DIAGNOSIS — Z91148 Patient's other noncompliance with medication regimen for other reason: Secondary | ICD-10-CM

## 2022-04-11 DIAGNOSIS — Z8611 Personal history of tuberculosis: Secondary | ICD-10-CM

## 2022-04-11 DIAGNOSIS — R569 Unspecified convulsions: Principal | ICD-10-CM

## 2022-04-11 DIAGNOSIS — N7093 Salpingitis and oophoritis, unspecified: Secondary | ICD-10-CM | POA: Diagnosis present

## 2022-04-11 DIAGNOSIS — Z885 Allergy status to narcotic agent status: Secondary | ICD-10-CM

## 2022-04-11 DIAGNOSIS — Z87410 Personal history of cervical dysplasia: Secondary | ICD-10-CM

## 2022-04-11 DIAGNOSIS — M8788 Other osteonecrosis, other site: Secondary | ICD-10-CM | POA: Diagnosis present

## 2022-04-11 DIAGNOSIS — Z79899 Other long term (current) drug therapy: Secondary | ICD-10-CM

## 2022-04-11 DIAGNOSIS — E271 Primary adrenocortical insufficiency: Secondary | ICD-10-CM | POA: Diagnosis present

## 2022-04-11 DIAGNOSIS — G8929 Other chronic pain: Secondary | ICD-10-CM | POA: Diagnosis present

## 2022-04-11 DIAGNOSIS — Z886 Allergy status to analgesic agent status: Secondary | ICD-10-CM

## 2022-04-11 DIAGNOSIS — G936 Cerebral edema: Secondary | ICD-10-CM | POA: Diagnosis present

## 2022-04-11 DIAGNOSIS — B0089 Other herpesviral infection: Secondary | ICD-10-CM | POA: Diagnosis present

## 2022-04-11 DIAGNOSIS — Z8249 Family history of ischemic heart disease and other diseases of the circulatory system: Secondary | ICD-10-CM

## 2022-04-11 DIAGNOSIS — Z202 Contact with and (suspected) exposure to infections with a predominantly sexual mode of transmission: Secondary | ICD-10-CM | POA: Diagnosis present

## 2022-04-11 DIAGNOSIS — R079 Chest pain, unspecified: Secondary | ICD-10-CM | POA: Diagnosis not present

## 2022-04-11 DIAGNOSIS — R002 Palpitations: Secondary | ICD-10-CM | POA: Diagnosis not present

## 2022-04-11 DIAGNOSIS — Z96643 Presence of artificial hip joint, bilateral: Secondary | ICD-10-CM | POA: Diagnosis present

## 2022-04-11 DIAGNOSIS — B20 Human immunodeficiency virus [HIV] disease: Secondary | ICD-10-CM | POA: Diagnosis present

## 2022-04-11 DIAGNOSIS — B589 Toxoplasmosis, unspecified: Secondary | ICD-10-CM | POA: Diagnosis present

## 2022-04-11 DIAGNOSIS — F419 Anxiety disorder, unspecified: Secondary | ICD-10-CM | POA: Diagnosis present

## 2022-04-11 HISTORY — DX: Suicidal ideations: R45.851

## 2022-04-11 HISTORY — DX: Essential (primary) hypertension: I10

## 2022-04-11 HISTORY — DX: Upper abdominal pain, unspecified: R10.10

## 2022-04-11 HISTORY — DX: Major depressive disorder, single episode, severe without psychotic features: F32.2

## 2022-04-11 LAB — COMPREHENSIVE METABOLIC PANEL
ALT: 18 U/L (ref 0–44)
AST: 24 U/L (ref 15–41)
Albumin: 3.9 g/dL (ref 3.5–5.0)
Alkaline Phosphatase: 70 U/L (ref 38–126)
Anion gap: 6 (ref 5–15)
BUN: 14 mg/dL (ref 6–20)
CO2: 23 mmol/L (ref 22–32)
Calcium: 9.1 mg/dL (ref 8.9–10.3)
Chloride: 108 mmol/L (ref 98–111)
Creatinine, Ser: 1 mg/dL (ref 0.44–1.00)
GFR, Estimated: >60 mL/min (ref >60)
Glucose, Bld: 81 mg/dL (ref 70–99)
Potassium: 4 mmol/L (ref 3.5–5.1)
Sodium: 137 mmol/L (ref 135–145)
Total Bilirubin: 0.3 mg/dL (ref 0.3–1.2)
Total Protein: 7.9 g/dL (ref 6.5–8.1)

## 2022-04-11 LAB — ETHANOL: Alcohol, Ethyl (B): <10 mg/dL (ref <10)

## 2022-04-11 LAB — LACTIC ACID, PLASMA
Lactic Acid, Venous: 2.2 mmol/L (ref 0.5–1.9)
Lactic Acid, Venous: 1.3 mmol/L (ref 0.5–1.9)

## 2022-04-11 LAB — CBC WITH DIFFERENTIAL/PLATELET
Abs Immature Granulocytes: 0.02 10*3/uL (ref 0.00–0.07)
Basophils Absolute: 0.1 10*3/uL (ref 0.0–0.1)
Basophils Relative: 1 %
Eosinophils Absolute: 0.7 10*3/uL — ABNORMAL HIGH (ref 0.0–0.5)
Eosinophils Relative: 10 %
HCT: 35.5 % — ABNORMAL LOW (ref 36.0–46.0)
Hemoglobin: 11.8 g/dL — ABNORMAL LOW (ref 12.0–15.0)
Immature Granulocytes: 0 %
Lymphocytes Relative: 24 %
Lymphs Abs: 1.8 10*3/uL (ref 0.7–4.0)
MCH: 31 pg (ref 26.0–34.0)
MCHC: 33.2 g/dL (ref 30.0–36.0)
MCV: 93.2 fL (ref 80.0–100.0)
Monocytes Absolute: 0.7 10*3/uL (ref 0.1–1.0)
Monocytes Relative: 9 %
Neutro Abs: 4.1 10*3/uL (ref 1.7–7.7)
Neutrophils Relative %: 56 %
Platelets: 214 10*3/uL (ref 150–400)
RBC: 3.81 MIL/uL — ABNORMAL LOW (ref 3.87–5.11)
RDW: 15.6 % — ABNORMAL HIGH (ref 11.5–15.5)
WBC: 7.4 10*3/uL (ref 4.0–10.5)
nRBC: 0 % (ref 0.0–0.2)

## 2022-04-11 LAB — URINALYSIS, ROUTINE W REFLEX MICROSCOPIC
Bacteria, UA: NONE SEEN
Bilirubin Urine: NEGATIVE
Glucose, UA: NEGATIVE mg/dL
Hgb urine dipstick: NEGATIVE
Ketones, ur: NEGATIVE mg/dL
Leukocytes,Ua: NEGATIVE
Nitrite: NEGATIVE
Protein, ur: 30 mg/dL — AB
Specific Gravity, Urine: 1.015 (ref 1.005–1.030)
pH: 6 (ref 5.0–8.0)

## 2022-04-11 LAB — I-STAT BETA HCG BLOOD, ED (MC, WL, AP ONLY): I-stat hCG, quantitative: <5 m[IU]/mL (ref <5)

## 2022-04-11 LAB — PROTIME-INR
INR: 1 (ref 0.8–1.2)
Prothrombin Time: 13.3 seconds (ref 11.4–15.2)

## 2022-04-11 LAB — APTT: aPTT: 30 seconds (ref 24–36)

## 2022-04-11 MED ORDER — SULFADIAZINE 500 MG PO TABS
1500.0000 mg | ORAL_TABLET | Freq: Four times a day (QID) | ORAL | Status: DC
  Filled 2022-04-11 (×2): qty 3

## 2022-04-11 MED ORDER — BICTEGRAVIR-EMTRICITAB-TENOFOV 50-200-25 MG PO TABS
1.0000 | ORAL_TABLET | Freq: Every day | ORAL | Status: DC
  Administered 2022-04-11 – 2022-04-17 (×7): 1 via ORAL
  Filled 2022-04-11 (×7): qty 1

## 2022-04-11 MED ORDER — SULFAMETHOXAZOLE-TRIMETHOPRIM 400-80 MG/5ML IV SOLN
5.0000 mg/kg | INTRAVENOUS | Status: AC
  Administered 2022-04-11: 415.04 mg via INTRAVENOUS
  Filled 2022-04-11: qty 25.94

## 2022-04-11 MED ORDER — LORAZEPAM 2 MG/ML IJ SOLN: 4.0000 mg | INTRAMUSCULAR | Status: DC | PRN

## 2022-04-11 MED ORDER — ACETAMINOPHEN 325 MG PO TABS
650.0000 mg | ORAL_TABLET | Freq: Four times a day (QID) | ORAL | Status: DC | PRN
  Filled 2022-04-11: qty 2

## 2022-04-11 MED ORDER — LEUCOVORIN CALCIUM 5 MG PO TABS
15.0000 mg | ORAL_TABLET | Freq: Every day | ORAL | Status: DC
  Filled 2022-04-11: qty 3

## 2022-04-11 MED ORDER — SENNA 8.6 MG PO TABS
1.0000 | ORAL_TABLET | Freq: Two times a day (BID) | ORAL | Status: DC
  Administered 2022-04-11 – 2022-04-16 (×10): 8.6 mg via ORAL
  Filled 2022-04-11 (×11): qty 1

## 2022-04-11 MED ORDER — POLYETHYLENE GLYCOL 3350 17 G PO PACK
17.0000 g | PACK | Freq: Every day | ORAL | Status: DC
  Administered 2022-04-12 – 2022-04-17 (×6): 17 g via ORAL
  Filled 2022-04-11 (×6): qty 1

## 2022-04-11 MED ORDER — GADOBUTROL 1 MMOL/ML IV SOLN
7.5000 mL | Freq: Once | INTRAVENOUS | Status: AC | PRN
  Administered 2022-04-11: 7.5 mL via INTRAVENOUS

## 2022-04-11 MED ORDER — PYRIMETHAMINE 25 MG PO TABS
75.0000 mg | ORAL_TABLET | Freq: Every day | ORAL | Status: DC
  Filled 2022-04-11: qty 3

## 2022-04-11 MED ORDER — ATOVAQUONE 750 MG/5ML PO SUSP
1500.0000 mg | Freq: Two times a day (BID) | ORAL | Status: DC
Start: 2022-04-11 — End: 2022-04-11
  Filled 2022-04-11: qty 10

## 2022-04-11 MED ORDER — LEVETIRACETAM 500 MG PO TABS
500.0000 mg | ORAL_TABLET | Freq: Two times a day (BID) | ORAL | Status: DC
Start: 2022-04-11 — End: 2022-04-18
  Administered 2022-04-11 – 2022-04-17 (×13): 500 mg via ORAL
  Filled 2022-04-11 (×13): qty 1

## 2022-04-11 MED ORDER — SODIUM CHLORIDE 0.9 % IV SOLN
2000.0000 mg | Freq: Once | INTRAVENOUS | Status: AC
  Administered 2022-04-11: 2000 mg via INTRAVENOUS
  Filled 2022-04-11: qty 20

## 2022-04-11 MED ORDER — ACETAMINOPHEN 650 MG RE SUPP: 650.0000 mg | Freq: Four times a day (QID) | RECTAL | Status: DC | PRN

## 2022-04-11 MED ORDER — SULFAMETHOXAZOLE-TRIMETHOPRIM 400-80 MG/5ML IV SOLN
10.0000 mg/kg/d | Freq: Two times a day (BID) | INTRAVENOUS | Status: DC
  Administered 2022-04-11 – 2022-04-17 (×13): 415.04 mg via INTRAVENOUS
  Filled 2022-04-11 (×17): qty 25.94

## 2022-04-11 MED ORDER — DOCUSATE SODIUM 100 MG PO CAPS
100.0000 mg | ORAL_CAPSULE | Freq: Two times a day (BID) | ORAL | Status: DC
  Administered 2022-04-11 – 2022-04-16 (×10): 100 mg via ORAL
  Filled 2022-04-11 (×11): qty 1

## 2022-04-11 NOTE — Progress Notes (Signed)
EEG complete - results pending 

## 2022-04-11 NOTE — Evaluation (Signed)
Occupational Therapy Evaluation ?Patient Details ?Name: Kelsey Rodriguez ?MRN: [id number] ?DOB: [birth year] ?Today's Date: 04/11/2022 ? ? ?History of Present Illness Pt is a 31 y/o female admitted secondary to seizure activity. 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  ? ?Clinical Impression ?  ?Pt admitted for concerns listed above. PTA pt reported that she was independent with all ADL's and IADL's, using no AD. At this time, pt is near her baseline, able to complete all ADL's and functional mobility with no difficulties. Pt has no further OT needs and acute OT will sign off.  ?   ? ?Recommendations for follow up therapy are one component of a multi-disciplinary discharge planning process, led by the attending physician.  Recommendations may be updated based on patient status, additional functional criteria and insurance authorization.  ? ?Follow Up Recommendations ? No OT follow up  ?  ?Assistance Recommended at Discharge PRN  ?Patient can return home with the following A little help with bathing/dressing/bathroom ? ?  ?Functional Status Assessment ? Patient has had a recent decline in their functional status and demonstrates the ability to make significant improvements in function in a reasonable and predictable amount of time.  ?Equipment Recommendations ? Tub/shower seat  ?  ?Recommendations for Other Services   ? ? ?  ?Precautions / Restrictions Precautions ?Precautions: Other (comment) ?Precaution Comments: Seizure ?Restrictions ?Weight Bearing Restrictions: No  ? ?  ? ?Mobility Bed Mobility ?Overal bed mobility: Modified Independent ?  ?  ?  ?  ?  ?  ?General bed mobility comments: Increased time, but no physical assist required ?  ? ?Transfers ?Overall transfer level: Needs assistance ?Equipment used: None ?Transfers: Sit to/from Stand ?Sit to Stand: Supervision ?  ?  ?  ?  ?  ?General transfer comment: supervision for safety ?  ? ?  ?Balance Overall balance assessment: Mild  deficits observed, not formally tested ?  ?  ?  ?  ?  ?  ?  ?  ?  ?  ?  ?  ?  ?  ?  ?  ?  ?  ?   ? ?ADL either performed or assessed with clinical judgement  ? ?ADL Overall ADL's : At baseline;Independent ?  ?  ?  ?  ?  ?  ?  ?  ?  ?  ?  ?  ?  ?  ?  ?  ?  ?  ?  ?General ADL Comments: Independent, no assist needed  ? ? ? ?Vision Baseline Vision/History: 0 No visual deficits ?Ability to See in Adequate Light: 0 Adequate ?Patient Visual Report: No change from baseline ?Vision Assessment?: No apparent visual deficits  ?   ?Perception   ?  ?Praxis   ?  ? ?Pertinent Vitals/Pain Pain Assessment ?Pain Assessment: 0-10 ?Pain Score: 7  ?Pain Location: full body soreness ?Pain Descriptors / Indicators: Sore ?Pain Intervention(s): Monitored during session, Repositioned  ? ? ? ?Hand Dominance Right ?  ?Extremity/Trunk Assessment Upper Extremity Assessment ?Upper Extremity Assessment: Overall WFL for tasks assessed ?  ?Lower Extremity Assessment ?Lower Extremity Assessment: Overall WFL for tasks assessed ?  ?Cervical / Trunk Assessment ?Cervical / Trunk Assessment: Normal ?  ?Communication Communication ?Communication: No difficulties ?  ?Cognition Arousal/Alertness: Awake/alert ?Behavior During Therapy: [facility] for tasks assessed/performed ?Overall Cognitive Status: Within Functional Limits for tasks assessed ?  ?  ?  ?  ?  ?  ?  ?  ?  ?  ?  ?  ?  ?  ?  ?  ?  ?  ?  ?  General Comments  VSS on RA ? ?  ?Exercises   ?  ?Shoulder Instructions    ? ? ?Home Living Family/patient expects to be discharged to:: Private residence ?Living Arrangements: Spouse/significant other ?Available Help at Discharge: Family ?Type of Home: House ?Home Access: Stairs to enter ?Entrance Stairs-Number of Steps: 1 ?Entrance Stairs-Rails: None ?Home Layout: One level ?  ?  ?Bathroom Shower/Tub: Walk-in shower ?  ?Bathroom Toilet: Standard ?  ?  ?Home Equipment: Conservation officer, nature (2 wheels);Cane - single point ?  ?  ?  ? ?  ?Prior Functioning/Environment Prior Level  of Function : Independent/Modified Independent;Driving ?  ?  ?  ?  ?  ?  ?  ?  ?  ? ?  ?  ?OT Problem List: Decreased strength;Decreased activity tolerance;Pain ?  ?   ?OT Treatment/Interventions:    ?  ?OT Goals(Current goals can be found in the care plan section) Acute Rehab OT Goals ?Patient Stated Goal: To go home ?OT Goal Formulation: With patient ?Time For Goal Achievement: 04/25/22 ?Potential to Achieve Goals: Good  ?OT Frequency:   ?  ? ?Co-evaluation   ?  ?  ?  ?  ? ?  ?AM-PAC OT "6 Clicks" Daily Activity     ?Outcome Measure Help from another person eating meals?: None ?Help from another person taking care of personal grooming?: None ?Help from another person toileting, which includes using toliet, bedpan, or urinal?: None ?Help from another person bathing (including washing, rinsing, drying)?: None ?Help from another person to put on and taking off regular upper body clothing?: None ?Help from another person to put on and taking off regular lower body clothing?: None ?6 Click Score: 24 ?  ?End of Session Nurse Communication: Mobility status ? ?Activity Tolerance: Patient tolerated treatment well ?Patient left: in bed;with call bell/phone within reach;with family/visitor present ? ?OT Visit Diagnosis: Unsteadiness on feet (R26.81);Other abnormalities of gait and mobility (R26.89);Muscle weakness (generalized) (M62.81)  ?              ?Time: 5916-3846 ?OT Time Calculation (min): 11 min ?Charges:  OT General Charges ?$OT Visit: 1 Visit ?OT Evaluation ?$OT Eval Moderate Complexity: 1 Mod ? ?Tajuana Kniskern H., OTR/L ?Acute Rehabilitation ? ?Ileana Chalupa Elane Yolanda Bonine ?04/11/2022, 4:42 PM ?

## 2022-04-11 NOTE — Hospital Course (Addendum)
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 who was admitted to the family medicine teaching Service at [facility] for generalized tonic-clonic seizure. Her hospital course is detailed below: ? ?Generalized tonic-clonic seizure ?Keppra was started in the hospital. EEG was negative for seizure activity. CT head was unremarkable.  MRI with concerns for reactivation of toxoplasmosis vs immune reconstitution inflammatory syndrome (see below).   Neurology was consulted and recommended further work-up with LP, IR performed LP that demonstrated normal cytology, gram stain that was negative and cultures without organisms. Patient did not have any further seizure activity during admission. She was discharged on Keppra 500 mg BID. ? ?HIV/AIDS ?H/o CNS toxoplasmosis ?H/o medication non-compliance ?Patient follows with Dr. Baxter Flattery with infectious disease outpatient. Patient somewhat compliant with home [place] but misses a few doses per week. During admission, CD4 count was 353, HIV viral load was 208. MRI showed interval increase in pre-existing ring enhancing lesions in the L frontal lobe with an increase in vasogenic edema. The findings were concerning for reactivation of toxoplasmosis vs immune reconstitution inflammatory syndrome. ID was consulted, and given MRI findings, recommended starting IV Bactrim for atleast 2 weeks in supervised setting, continuing on Biktarvy, and LP.  Patient had false positive RPR. Toxo, VDRL, CMV, BK, EBV CSF all negative. CSF fluid was negative for malignant cells and had some acute/chronic inflammation. ID continued bactrim for patient outpatient as PCR Toxo not as sensitive and hx of patient as well. Did not require steroids for possible  IRIS. ? ?Intermittent palpitations  Reproducible chest pain ?EKG with NSR and troponin was negative and flat. Likely musculoskeletal. Resolved at discharge. ? ?History of avascular necrosis with bilateral hip replacement, stable ?Follows with orthopedic surgery outpatient. ? ?Other chronic conditions were medically managed with home medications and formulary alternatives as necessary  ?

## 2022-04-11 NOTE — ED Provider Notes (Signed)
?[place] ?Provider Note ? ? ?CSN: [id number] ?Arrival date & time: 04/11/22  7741 ? ?  ? ?History ? ?Chief Complaint  ?Patient presents with  ? Seizures  ? ? ?Kelsey Rodriguez is a 31 y.o. female. ? ?Patient presents to the emergency department for evaluation of seizure.  Seizure witnessed by her husband.  He reports that she had a generalized tonic-clonic seizure lasted approximately 5 minutes.  There was a described postictal state, patient now awake and alert at arrival to the ER.  She reports that she has been experiencing a headache for the last 2 nights.  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  Patient had seizures and headaches prior to clearing of the toxoplasmosis, none since. ? ? ?  ? ?Home Medications ?Prior to Admission medications   ?Medication Sig Start Date End Date Taking? Authorizing Provider  ?acetaminophen (TYLENOL) 500 MG tablet Take 500 mg by mouth every 6 (six) hours as needed for mild pain or headache.   Yes [provider]  ?atovaquone (MEPRON) 750 MG/5ML suspension Take 10 mLs (1,500 mg total) by mouth 2 (two) times daily with a meal. 02/17/22 02/17/23 Yes Carlyle Basques, MD  ?bictegravir-emtricitabine-tenofovir AF (BIKTARVY) 50-200-25 MG TABS tablet TAKE 1 TABLET BY MOUTH DAILY. 04/19/21 05/02/22 Yes Anyanwu, Sallyanne Havers, MD  ?gabapentin (NEURONTIN) 400 MG capsule Take 400 mg by mouth 3 (three) times daily. 06/12/21  Yes [provider]  ?ibuprofen (ADVIL) 800 MG tablet Take 800 mg by mouth 3 (three) times daily as needed for headache or moderate pain. 06/07/21  Yes [provider]  ?Multiple Vitamins-Minerals (ONE-A-DAY WOMENS PO) Take 1 tablet by mouth daily. gummy   Yes [provider]  ?polyethylene glycol (MIRALAX / GLYCOLAX) 17 g packet Take 17 g by mouth daily.   Yes [provider]  ?Polyvinyl Alcohol-Povidone (REFRESH OP) Place 1 drop into both eyes as needed  (dry eyes).   Yes [provider]  ?lactulose (CEPHULAC) 10 g packet Take 1 packet (10 g total) by mouth 3 (three) times daily. ?Patient not taking: Reported on 04/11/2022 02/17/22   Carlyle Basques, MD  ?triamcinolone ointment (KENALOG) 0.5 % Apply 1 application. topically 2 (two) times daily. ?Patient not taking: Reported on 04/11/2022 02/28/22   Penfield Callas, NP  ?SUMAtriptan (IMITREX) 50 MG tablet Take 1 tablet (50 mg total) by mouth every 2 (two) hours as needed for migraine (Maximum dose: 100 mg per dose; 200 mg per 24 hours). May repeat in 2 hours if headache persists or recurs. 10/26/19 01/31/20  Caroline More, DO  ?   ? ?Allergies    ?Hydrocodone and Tramadol   ? ?Review of Systems   ?Review of Systems ? ?Physical Exam ?Updated Vital Signs ?BP 127/83   Pulse 95   Temp 99 ?F (37.2 ?C) (Oral)   Resp 17   LMP 05/28/2021 (Approximate)   SpO2 100%  ?Physical Exam ?Vitals and nursing note reviewed.  ?Constitutional:   ?   General: She is not in acute distress. ?   Appearance: She is well-developed.  ?HENT:  ?   Head: Normocephalic and atraumatic.  ?   Mouth/Throat:  ?   Mouth: Mucous membranes are moist.  ?Eyes:  ?   General: Vision grossly intact. Gaze aligned appropriately.  ?   Extraocular Movements: Extraocular movements intact.  ?   Conjunctiva/sclera: Conjunctivae normal.  ?Cardiovascular:  ?   Rate and Rhythm: Regular rhythm.  Tachycardia present.  ?   Pulses: Normal pulses.  ?   Heart sounds: Normal heart sounds, S1 normal and S2 normal. No murmur heard. ?  No friction rub. No gallop.  ?Pulmonary:  ?   Effort: Pulmonary effort is normal. No respiratory distress.  ?   Breath sounds: Normal breath sounds.  ?Abdominal:  ?   General: Bowel sounds are normal.  ?   Palpations: Abdomen is soft.  ?   Tenderness: There is no abdominal tenderness. There is no guarding or rebound.  ?   Hernia: No hernia is present.  ?Musculoskeletal:     ?   General: No swelling.  ?   Cervical back: Full passive range  of motion without pain, normal range of motion and neck supple. No spinous process tenderness or muscular tenderness. Normal range of motion.  ?   Right lower leg: No edema.  ?   Left lower leg: No edema.  ?Skin: ?   General: Skin is warm and dry.  ?   Capillary Refill: Capillary refill takes less than 2 seconds.  ?   Findings: No ecchymosis, erythema, rash or wound.  ?Neurological:  ?   General: No focal deficit present.  ?   Mental Status: She is alert and oriented to person, place, and time.  ?   GCS: GCS eye subscore is 4. GCS verbal subscore is 5. GCS motor subscore is 6.  ?   Cranial Nerves: Cranial nerves 2-12 are intact.  ?   Sensory: Sensation is intact.  ?   Motor: Motor function is intact.  ?   Coordination: Coordination is intact.  ?Psychiatric:     ?   Attention and Perception: Attention normal.     ?   Mood and Affect: Mood normal.     ?   Speech: Speech normal.     ?   Behavior: Behavior normal.  ? ? ?ED Results / Procedures / Treatments   ?Labs ?(all labs ordered are listed, but only abnormal results are displayed) ?Labs Reviewed  ?LACTIC ACID, PLASMA - Abnormal; Notable for the following components:  ?    Result Value  ? Lactic Acid, Venous 2.2 (*)   ? All other components within normal limits  ?CBC WITH DIFFERENTIAL/PLATELET - Abnormal; Notable for the following components:  ? RBC 3.81 (*)   ? Hemoglobin 11.8 (*)   ? HCT 35.5 (*)   ? RDW 15.6 (*)   ? Eosinophils Absolute 0.7 (*)   ? All other components within normal limits  ?URINALYSIS, ROUTINE W REFLEX MICROSCOPIC - Abnormal; Notable for the following components:  ? APPearance HAZY (*)   ? Protein, ur 30 (*)   ? All other components within normal limits  ?CULTURE, BLOOD (ROUTINE X 2)  ?CULTURE, BLOOD (ROUTINE X 2)  ?URINE CULTURE  ?COMPREHENSIVE METABOLIC PANEL  ?PROTIME-INR  ?APTT  ?LACTIC ACID, PLASMA  ?I-STAT BETA HCG BLOOD, ED ([facility], [facility], [facility] ONLY)  ? ? ?EKG ?EKG Interpretation ? ?Date/Time:  Friday April 11 2022 02:31:07 EDT ?Ventricular Rate:   121 ?PR Interval:  144 ?QRS Duration: 83 ?QT Interval:  309 ?QTC Calculation: 439 ?R Axis:   65 ?Text Interpretation: Sinus tachycardia Consider left atrial enlargement Confirmed by Orpah Greek [phone]) on 04/11/2022 2:54:32 AM ? ?Radiology ?CT HEAD WO CONTRAST (5MM) ? ?Result Date: 04/11/2022 ?CLINICAL DATA:  Meningitis/CNS infection suspected.  Seizures. EXAM: CT HEAD WITHOUT CONTRAST TECHNIQUE: Contiguous axial images were obtained from the base of the skull through  the vertex without intravenous contrast. RADIATION DOSE REDUCTION: This exam was performed according to the departmental dose-optimization program which includes automated exposure control, adjustment of the mA and/or kV according to patient size and/or use of iterative reconstruction technique. COMPARISON:  09/07/2020 FINDINGS: Brain: Stable area of low-density within the right temporal lobe in area of prior biopsy/partial resection. No acute intracranial abnormality. Specifically, no hemorrhage, hydrocephalus, mass lesion, acute infarction, or significant intracranial injury. Vascular: No hyperdense vessel or unexpected calcification. Skull: No acute calvarial abnormality. Sinuses/Orbits: No acute findings Other: None IMPRESSION: No acute intracranial abnormality. Electronically Signed   By: Rolm Baptise M.D.   On: 04/11/2022 03:23  ? ?DG Chest Port 1 View ? ?Result Date: 04/11/2022 ?CLINICAL DATA:  Seizure, possible sepsis EXAM: PORTABLE CHEST 1 VIEW COMPARISON:  07/11/2020 FINDINGS: Lungs are clear.  No pleural effusion or pneumothorax. The heart is normal in size. IMPRESSION: No evidence of acute cardiopulmonary disease. Electronically Signed   By: Julian Hy M.D.   On: 04/11/2022 03:11   ? ?Procedures ?Procedures  ? ? ?Medications Ordered in ED ?Medications  ?levETIRAcetam (KEPPRA) 2,000 mg in sodium chloride 0.9 % 250 mL IVPB (has no administration in time range)  ? ? ?ED Course/ Medical Decision Making/ A&P ?  ?                         ?Medical Decision Making ?Amount and/or Complexity of Data Reviewed ?External Data Reviewed: labs and radiology. ?Labs: ordered. Decision-making details documented in ED Course. ?Radiology: ordered a

## 2022-04-11 NOTE — ED Notes (Signed)
Patient transported to MRI 

## 2022-04-11 NOTE — Progress Notes (Signed)
?  Transition of Care (TOC) Screening Note ? ? ?Patient Details  ?Name: Kelsey Rodriguez ?Date of Birth: Sep 08, 1991 ? ? ?Transition of Care (TOC) CM/SW Contact:    ?Benard Halsted, LCSW ?Phone Number: ?04/11/2022, 5:33 PM ? ? ? ?Transition of Care Department [facility]) has reviewed patient and no TOC needs have been identified at this time. We will continue to monitor patient advancement through interdisciplinary progression rounds. If new patient transition needs arise, please place a TOC consult. ? ? ?

## 2022-04-11 NOTE — Progress Notes (Signed)
FPTS Interim Progress Note ? ?S: Patient reports of headache and neck pain this morning.  Overall, she feels achy but feels back to baseline. ? ?O: ?BP 109/76   Pulse 83   Temp 99 ?F (37.2 ?C) (Oral)   Resp 15   LMP 05/28/2021 (Approximate)   SpO2 100%   ?General: Alert and oriented in no apparent distress, nontoxic in appearance, pleasant ?Heart: Regular rate and rhythm with no murmurs appreciated ?Lungs: CTA bilaterally, no wheezing ?Abdomen: Bowel sounds present, no abdominal pain ?Skin: Warm and dry ?Extremities: No lower extremity edema ?Neuro: A&Ox3 without obvious focal neurologic deficit, negative Brudzinski sign ? ? ?A/P: ?Kelsey Rodriguez is a 31 y.o. female presenting to [facility] after generalized tonic-clonic seizure. 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 ?  ?Generalized Tonic Clonic Seizure ?Neurologically intact today with negative Brudzinski sign.  Patient was started on a clinical trial on 04/09/2022 with no change in medications at this time.  CT scan showed no acute abnormality, MRI pending.  Patient has been given a Keppra load 2G followed by Keppra 500 mg BID.  EEG also ordered and pending.  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  She reports of some medication compliance at this time for her HIV/AIDS and toxo prophylaxis (see below).  Will formally consult neurology once MRI and EEG results if indicated.  Consider LP if patient exhibits meningitic symptoms. ?- Consider consulting neurology ?- Ativan 4 Mg IV as needed for seizure greater than 5 minutes ?- Continue Keppra as above ?- Valtoco intranasal 10 Mg spray per nostril discharge ?- No driving for 6 months ?- N.p.o. prior to bedside swallow ?-  EEG and MRI pending ?- Ethanol and UDS also pending ? ?HIV/AIDS ?Medication noncompliance ?Hx of CNS toxoplasmosis ?Patient follows with Dr. Graylon Good with infectious disease outpatient.  Patient takes [place] but misses a dose about 2-3 times a week.  Last viral quant 3 weeks ago was 237,000 with CD4 count on 3/17 252.  ID has been consulted and will see the patient.  Concern for recurrence of toxoplasmosis, MRI pending as stated above. ?- Continue home Biktarvy and atovaquone ?- ID consulted, appreciate recommendations ?- CD4 count ordered ?- Brain MRI as above ?- Consider LP if indicated  ? ?Hx of avascular necrosis s/p bilateral hip replacement, stable ?Follows with orthopedics outpatient.  No current signs of infection, blood cultures been obtained. ? ?Erskine Emery, MD ?04/11/2022, 8:52 AM ?PGY-1, [facility] ?Service pager [phone] ? ?

## 2022-04-11 NOTE — Progress Notes (Signed)
PT Cancellation Note ? ?Patient Details ?Name: Kelsey Rodriguez ?MRN: [id number] ?DOB: 11-Sep-1991 ? ? ?Cancelled Treatment:    Reason Eval/Treat Not Completed: Patient at procedure or test/unavailable Will follow up as schedule allows.  ? ?Reuel Derby, PT, DPT  ?Acute Rehabilitation Services  ?Pager: [phone] ?Office: [phone] ? ? ? ?[place] ?04/11/2022, 10:47 AM ?

## 2022-04-11 NOTE — Procedures (Signed)
Patient Name: Kelsey Rodriguez  ?MRN: [id number]  ?Epilepsy Attending: Lora Havens  ?Referring Physician/Provider: France Ravens, MD ?Date: 04/11/2022 ?Duration: 23.05 mins ? ?Patient history: 31 y.o. female presenting with seizure. She had a generalized tonic-clonic seizure lasting about 5 minutes. EEG to evaluate for seizure.  ? ?Level of alertness: Awake, asleep ? ?AEDs during EEG study: None ? ?Technical aspects: This EEG study was done with scalp electrodes positioned according to the 10-20 International system of electrode placement. Electrical activity was acquired at a sampling rate of '500Hz'$  and reviewed with a high frequency filter of '70Hz'$  and a low frequency filter of '1Hz'$ . EEG data were recorded continuously and digitally stored.  ? ?Description: The posterior dominant rhythm consists of 10 Hz activity of moderate voltage (25-35 uV) seen predominantly in posterior head regions, symmetric and reactive to eye opening and eye closing. Sleep was characterized by vertex waves, sleep spindles (12 to 14 Hz), maximal frontocentral region. Hyperventilation and photic stimulation were not performed.    ? ?IMPRESSION: ?This study is within normal limits. No seizures or epileptiform discharges were seen throughout the recording. ? ?Lora Havens  ? ?

## 2022-04-11 NOTE — Consult Note (Addendum)
Neurology Consultation ? ?Reason for Consult: Seizure activity ?Referring Physician: Dr Madison Hickman ? ?CC: seizure activity ? ?History is obtained from:Patient and chart review ? ?HPI: Kelsey Rodriguez is a 31 y.o. 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  Seizure witnessed by her husband.He reports that she had a generalized tonic-clonic seizure lasted approximately 5 minutes.There was a described postictal state, patient now awake and alert at arrival to the ER. She reports that she has been experiencing a headache for the last 2 nights Patient had seizures and headaches prior to clearing of the toxoplasmosis, none since.According to the patient she was on seizure medication which she was taken off after she had no seizure for over a year. Patient denies having aura prior tto having this seizures. Denies any fever, dysuria, cough, diarrhea or any other neurological deficits ?  ? ? ?ROS: Full ROS was performed and is negative except as noted in the HPI.  ? ?Past Medical History:  ?Diagnosis Date  ? 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 Adrenal insufficiency ([place])   ? Anemia of chronic disease 03/11/2012  ? Back pain of lumbar region with sciatica 02/12/2015  ? Bell's palsy 08/26/2013  ? Brain lesion   ? [place] 05/30/2012  ? Chronic back pain   ? Chronic leg pain   ? bilateral knees, ankles  ? Depression   ? Fatigue   ? GERD (gastroesophageal reflux disease)   ? Headache   ? 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 HIV (human immunodeficiency virus infection) ([place]) 02/2012  ? Laceration of ankle, right 11/18/2012  ?  Lumbar radiculopathy   ? Meningitis 02/18/2018  ? Pelvic pain   ? PID (acute pelvic inflammatory disease) 02/26/2018  ? Pneumonia   ? Reflux esophagitis 03/11/2012  ? Seizure ([place])   ? Tuberculosis   ? 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 Vertigo   ? Wears glasses   ? ?Family History  ?Problem Relation Age of Onset  ? Heart disease Father   ?     Vague not clearly cardiac  ? Hypertension Mother   ? ? ? ?Social History:  ? reports that she has never smoked. She has never used smokeless tobacco. She reports that she does not currently use alcohol. She reports that she does not use drugs. ? ?Medications ? ?Current Facility-Administered Medications:  ?  acetaminophen (TYLENOL) tablet 650 mg, 650 mg, Oral, Q6H PRN **OR** acetaminophen (TYLENOL) suppository 650 mg, 650 mg, Rectal, Q6H PRN, France Ravens, MD ?  bictegravir-emtricitabine-tenofovir AF (BIKTARVY) 50-200-25 MG per tablet 1 tablet, 1 tablet, Oral, Daily, France Ravens, MD, 1 tablet at 04/11/22 1141 ?  docusate sodium (COLACE) capsule 100 mg, 100 mg, Oral, BID, France Ravens, MD ?  levETIRAcetam (KEPPRA) tablet 500 mg, 500 mg, Oral, BID, France Ravens, MD, 500 mg at 04/11/22 1140 ?  LORazepam (ATIVAN) injection 4 mg, 4 mg, Intravenous, PRN, France Ravens, MD ?  polyethylene glycol (MIRALAX / GLYCOLAX) packet 17 g, 17 g, Oral, Daily, France Ravens, MD ?  senna [facility]) tablet 8.6 mg, 1 tablet, Oral, BID, France Ravens, MD ?  sulfamethoxazole-trimethoprim (BACTRIM) 415.04 mg in dextrose 5 % 500 mL IVPB, 10 mg/kg/day, Intravenous, Q12H, Manandhar,  Collene Mares, MD ? ?Current Outpatient Medications:  ?  acetaminophen (TYLENOL) 500 MG tablet, Take 500 mg by mouth every 6 (six) hours as needed for mild pain or headache., Disp: , Rfl:  ?  atovaquone (MEPRON) 750 MG/5ML suspension, Take 10 mLs (1,500 mg total) by mouth 2 (two) times daily with a meal., Disp: 600 mL, Rfl: 11 ?  bictegravir-emtricitabine-tenofovir AF (BIKTARVY) 50-200-25 MG TABS tablet, TAKE 1 TABLET BY MOUTH DAILY.,  Disp: 30 tablet, Rfl: 11 ?  gabapentin (NEURONTIN) 400 MG capsule, Take 400 mg by mouth 3 (three) times daily., Disp: , Rfl:  ?  ibuprofen (ADVIL) 800 MG tablet, Take 800 mg by mouth 3 (three) times daily as needed for headache or moderate pain., Disp: , Rfl:  ?  Multiple Vitamins-Minerals (ONE-A-DAY WOMENS PO), Take 1 tablet by mouth daily. gummy, Disp: , Rfl:  ?  polyethylene glycol (MIRALAX / GLYCOLAX) 17 g packet, Take 17 g by mouth daily., Disp: , Rfl:  ?  Polyvinyl Alcohol-Povidone (REFRESH OP), Place 1 drop into both eyes as needed (dry eyes)., Disp: , Rfl:  ?  lactulose (CEPHULAC) 10 g packet, Take 1 packet (10 g total) by mouth 3 (three) times daily. (Patient not taking: Reported on 04/11/2022), Disp: 30 each, Rfl: 1 ?  triamcinolone ointment (KENALOG) 0.5 %, Apply 1 application. topically 2 (two) times daily. (Patient not taking: Reported on 04/11/2022), Disp: 30 g, Rfl: 1 ? ? ?Exam: ?Current vital signs: ?BP 114/89   Pulse 82   Temp 98.5 ?F (36.9 ?C) (Oral)   Resp 16   LMP 05/28/2021 (Approximate)   SpO2 100%  ?Vital signs in last 24 hours: ?Temp:  [98.5 ?F (36.9 ?C)-99 ?F (37.2 ?C)] 98.5 ?F (36.9 ?C) (04/28 1439) ?Pulse Rate:  [73-121] 82 (04/28 1300) ?Resp:  [13-21] 16 (04/28 1400) ?BP: (108-137)/(76-94) 114/89 (04/28 1400) ?SpO2:  [98 %-100 %] 100 % (04/28 1300) ? ?GENERAL: Awake, alert in NAD ?HEENT: - Normocephalic and atraumatic, dry mm, no LN++, no Thyromegally ?LUNGS - Clear to auscultation bilaterally with no wheezes ?CV - S1S2 RRR, no m/r/g, equal pulses bilaterally. ?ABDOMEN - Soft, nontender, nondistended with normoactive BS ?Ext: warm, well perfused, intact peripheral pulses, __ no edema ? ?Patient alert and oriented x 3. Speech clear. No aphasia. No dysarthria. Extraoccular movements intact. Visual fields full. Face symmetric. Tongue midline. Moves all extremities x 4. Strength normal. Coordination normal. Sensation intact. Heart rate regular. Breath sounds clear.  ? ?Labs ?I have  reviewed labs in epic and the results pertinent to this consultation are: ? ? ?CBC ?   ?Component Value Date/Time  ? WBC 7.4 04/11/2022 0333  ? RBC 3.81 (L) 04/11/2022 0333  ? HGB 11.8 (L) 04/11/2022 0333  ? HCT 35.5 (L) 04/11/2022 0333  ? PLT 214 04/11/2022 0333  ? MCV 93.2 04/11/2022 0333  ? MCH 31.0 04/11/2022 0333  ? MCHC 33.2 04/11/2022 0333  ? RDW 15.6 (H) 04/11/2022 0333  ? LYMPHSABS 1.8 04/11/2022 0333  ? MONOABS 0.7 04/11/2022 0333  ? EOSABS 0.7 (H) 04/11/2022 8469  ? BASOSABS 0.1 04/11/2022 0333  ? ? ?CMP  ?   ?Component Value Date/Time  ? NA 137 04/11/2022 0333  ? NA 138 01/04/2020 1148  ? K 4.0 04/11/2022 0333  ? CL 108 04/11/2022 0333  ? CO2 23 04/11/2022 0333  ? GLUCOSE 81 04/11/2022 0333  ? BUN 14 04/11/2022 0333  ? BUN 7 01/04/2020 1148  ? CREATININE 1.00 04/11/2022 0333  ? CREATININE 0.81 03/17/2022 1200  ?  CALCIUM 9.1 04/11/2022 0333  ? PROT 7.9 04/11/2022 0333  ? PROT 8.2 01/04/2020 1148  ? ALBUMIN 3.9 04/11/2022 0333  ? ALBUMIN 4.8 01/04/2020 1148  ? AST 24 04/11/2022 0333  ? ALT 18 04/11/2022 0333  ? ALKPHOS 70 04/11/2022 0333  ? BILITOT 0.3 04/11/2022 0333  ? BILITOT 0.3 01/04/2020 1148  ? GFRNONAA >60 04/11/2022 0333  ? GFRNONAA 77 08/13/2020 1119  ? GFRAA >60 08/30/2020 1936  ? GFRAA 89 08/13/2020 1119  ? ? ?Lipid Panel  ?   ?Component Value Date/Time  ? CHOL 234 (H) 04/09/2022 0950  ? TRIG 73 04/09/2022 0950  ? HDL 74 04/09/2022 0950  ? CHOLHDL 3.2 04/09/2022 0950  ? VLDL 29 12/25/2016 1550  ? LDLCALC 143 (H) 04/09/2022 0950  ?Urinalysis ?   ?Component Value Date/Time  ? [place] YELLOW 04/11/2022 0253  ? APPEARANCEUR HAZY (A) 04/11/2022 0253  ? LABSPEC 1.015 04/11/2022 0253  ? PHURINE 6.0 04/11/2022 0253  ? [place] NEGATIVE 04/11/2022 0253  ? [place] NEGATIVE 04/11/2022 0253  ? [place] NEGATIVE 04/11/2022 0253  ? BILIRUBINUR negative 12/31/2018 1550  ? [place] NEGATIVE 04/11/2022 0253  ? PROTEINUR 30 (A) 04/11/2022 0253  ? UROBILINOGEN 0.2 12/31/2018 1550  ? UROBILINOGEN 0.2  09/14/2015 0829  ? NITRITE NEGATIVE 04/11/2022 0253  ? LEUKOCYTESUR NEGATIVE 04/11/2022 0253  ? ?  ? ?Imaging ?I have reviewed the images  ? ?obtained:CT-head without contrast done on 04/11/2022  ?IMPRESSION: ?No acute intr

## 2022-04-11 NOTE — H&P (Signed)
Family Medicine Teaching Service ?Hospital Admission History and Physical ?Service Pager: [phone] ? ?Patient name: Kelsey Rodriguez [employer] Medical record number: [id number] ?Date of birth: 1991/10/22 Age: 31 y.o. Gender: female ? ?Primary Care Provider: Gerlene Fee, DO ?Consultants: neurology ?Code Status: FULL ?Preferred Emergency Contact: husband and mother ?Contact Information   ? ? Name Relation Home Work Mobile  ? Dennison Mascot Spouse [phone]  [phone]  ? Royetta Asal Mother [phone]  [phone]  ? ?  ? ? ? ?Chief Complaint: Seizure ? ?Assessment and Plan: ?Yarah Bih Mcphee is a 31 y.o. female presenting to [facility] after generalized tonic-clonic seizure . 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 ? ?Generalized Tonic Clonic Seizure ?Had witnessed tonic-clonic seizure observed by husband. Had postictal state, back to baseline by arrival. Lactic Acid 2.2. She has had constant headache for the past 2 to 3 days as well as poor sleep (4 hour/day) for past 3 days. Does not appear to have tongue biting.  Loaded with Keppra 2 g followed by Keppra 500 mg twice daily.  Has not had seizures since stereotactic craniectomy with tumor resection last year.  Neuro will formally if there is further management required at this time.  Head CT showed no acute intracranial abnormality. ?-Admit to FPTS, telemetry med, attending Dr. Erin Hearing ?-Vitals per floor ?-Neuro consult  when MRI resulted, appreciate recs ?-Routine EEG ?-Ativan 4 mg IV as needed for seizure greater than 5 minutes ?-Brain MRI with and without contrast ?-Valtoco intranasal 10 mg spray per nostril at discharge ?-No driving for 6 months  ?-Currently NPO, bedside swallow, resume diet after passing ?-Ethanol level and UDS to assess  for other risk factors for seizures ? ?HIV/AIDS ?Medication noncompliance ?Hx of CNS toxoplasmosis ?Follows Dr. Baxter Flattery ID outpatient for HIV/AIDS.  On Biktarvy but misses dose 2-3 times a week. Unclear if taking atovaquone consistently. Last viral quant 3 weeks ago 237,000. CD4 count 3/17 252. We will touch base with ID regarding management. Subjective fever yesterday, 3 day headache, neck stiffness.  Headache is a chronic problem but neck stiffness, sore throat and subjective fever appears to be just past few days. Was tachycardic and tachypneic on arrival, but afebrile (99 F). Not acutely concerned about meningitis but will monitor closely.  ?-Strongly encourage medication compliance ?-Brain MRI as above ?-CD4 count per neuro rec ?-ID consult, appreciate recs ?-LP if fevers or other signs of infection ?-Resume home Biktarvy qd ?-Resume home Atovaquone qd ? ?Hx of avascular necrosis s/p bilateral hip replacement, stable ?Follow outpatient ortho. Has had anterior right hip pain with recommendation for fluoroscopic guided steroid injection. ?-Outpatient orthopedic follow up ? ?FEN/GI: NPO pending swallow screen ?Prophylaxis: SCD. [place] 2. ? ?Disposition: telemed ? ?History of Present Illness:  Kelsey Rodriguez is a 30 y.o. female presenting with seizure. ? ?She had a generalized tonic-clonic seizure lasting about 5 minutes witnessed by husband prior to arrival. She had described a postictal state following the event, awake and alert when she arrived to the ED. Patient states she has had a headache for the past two days and also developed a subjective fever yesterday. She has frequent episodic headaches primarily in bilateral sides of head, she estimates about 2 times per week. She reports she may miss her Biktarvy dose 2-3 times per week and presumably her atovaquone. She has had poor sleep 4-5 hours per night  for the past few days as well as sore throat and neck pain. No seizures after having craniectomy and  tumor removal last year until last night. ? ?She follows regularly with orthopedics and infectious diseases as an outpatient for her avascular necrosis status post bilateral hip replacements and HIV/AIDS complicated by toxoplasmosis. ? ?Reports approximately 3 beers per week and denies any other substance use. ? ?Review Of Systems: Per HPI with the following additions:  ? ?Review of Systems  ?Gastrointestinal:  Positive for constipation (baseline).  ?Genitourinary:  Negative for dysuria.  ?Neurological:  Positive for seizures.   ? ?Patient Active Problem List  ? Diagnosis Date Noted  ? Screening for cervical cancer 03/18/2022  ? Eczema 02/28/2022  ? Tubo-ovarian abscess 04/25/2021  ? TOA (tubo-ovarian abscess) 04/15/2021  ? Cervical dysplasia, mild 12/06/2020  ? Encounter for contraceptive management 11/01/2020  ? STD exposure 11/01/2020  ? S/P craniotomy 05/16/2020  ? Brain tumor ([place]) 05/16/2020  ? Headache due to intracranial disease 05/09/2020  ? Hypertension   ? Pain of upper abdomen   ? Suicide ideation   ? 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[place])   ? Seizure ([place])   ? Encephalitis, myelitis, 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[place]) 01/31/2020  ? Rotator cuff strain 01/26/2020  ? CNS toxoplasmosis ([place]) 11/07/2019  ? AIDS (acquired immune deficiency syndrome) ([place]) 11/07/2019  ? Cerebral edema ([place]) 10/28/2019  ? Pruritus 08/29/2019  ? Tendinopathy of left shoulder 01/18/2019  ? Chronic pelvic pain in female 01/04/2019  ? Lower abdominal pain 06/21/2018  ? Syphilis 02/26/2018  ? Tuberculosis   ? ASCUS with positive high risk HPV cervical 09/14/2017  ? Acute right-sided low back pain with right-sided sciatica 08/24/2017  ? Complex regional pain syndrome 02/03/2017  ? Headache 10/28/2016  ? Avascular necrosis of bone of right hip ([place]) 04/04/2016  ? 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 Avascular necrosis of bone of left hip ([place]) 12/14/2015  ? Status post total  replacement of left hip 12/14/2015  ? Vertigo 01/23/2015  ? Primary adrenal insufficiency ([place]) 01/03/2015  ? Chest pain 07/07/2013  ? HIV (human immunodeficiency virus infection) ([place]) 03/16/2012  ? 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 ? ?Past Medical History: ?Past Medical History:  ?Diagnosis Date  ? 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 Adrenal insufficiency ([place])   ? Anemia of chronic disease 03/11/2012  ? Back pain of lumbar region with sciatica 02/12/2015  ? Bell's palsy 08/26/2013  ? Brain lesion   ? Bullae 05/30/2012  ? Chronic back pain   ? Chronic leg pain   ? bilateral knees, ankles  ? Depression   ? Fatigue   ? GERD (gastroesophageal reflux disease)   ? Headache   ? 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 HIV (human immunodeficiency virus infection) ([place]) 02/2012  ? Laceration of ankle, right 11/18/2012  ? Lumbar radiculopathy   ? Meningitis 02/18/2018  ? Pelvic pain   ? PID (acute pelvic inflammatory disease) 02/26/2018  ? Pneumonia   ? Reflux esophagitis 03/11/2012  ? Seizure ([place])   ? Tuberculosis   ? 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 Vertigo   ? Wears glasses   ? ? ?Past Surgical History: ?Past Surgical History:  ?Procedure Laterality Date  ? APPENDECTOMY  ~ 2000  ? APPLICATION OF CRANIAL NAVIGATION N/A 05/16/2020  ? Procedure: APPLICATION OF CRANIAL NAVIGATION;  Surgeon: Newman Pies, MD;  Location: [facility];  Service: Neurosurgery;  Laterality:  N/A;  ? CRANIOTOMY Right 05/16/2020  ? Procedure: Craniotomy for Resection of Lesion;  Surgeon: Newman Pies, MD;  Location: [facility];  Service: Neurosurgery;  Laterality: Right;  right  ? DILATION AND CURETTAGE OF UTERUS  2008  ? ESOPHAGOGASTRODUODENOSCOPY  03/11/2012  ? Procedure: ESOPHAGOGASTRODUODENOSCOPY (EGD);  Surgeon: Lafayette Dragon, MD;  Location: [facility] ENDOSCOPY;  Service: Endoscopy;  Laterality: N/A;  ? ESOPHAGOGASTRODUODENOSCOPY N/A 03/07/2014  ? Procedure: ESOPHAGOGASTRODUODENOSCOPY (EGD);  Surgeon: Gatha Mayer, MD;   Location: [facility] ENDOSCOPY;  Service: Endoscopy;  Laterality: N/A;  ? LUNG BIOPSY  02/2012  ? TOTAL HIP ARTHROPLASTY Left 12/14/2015  ? Procedure: LEFT TOTAL HIP ARTHROPLASTY ANTERIOR APPROACH;  Surgeon: Gerald Stabs

## 2022-04-11 NOTE — Consult Note (Addendum)
? ? ?[facility]  ?                                                                                     ? ?Patient Identification: ?Patient Name: Kelsey Rodriguez MRN: [id number] [place] Date: 04/11/2022  2:13 AM ?Today's Date: 04/11/2022 ?Reason for consult: seizures  ?Requesting provider: Madison Hickman  ? ?Principal Problem: ?  Seizure ([place]) ? ? ?Antibiotics: none  ? ?Lines/Hardware: rt and lt THA ? 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 CT unremarkable. She misses 2-3 pills in a week.  ? ?Recommendations  ?- Continue Biktarvy, recent genotype wiith no concerns for resistance. Her bump in VL seems to be related to poor compliance. CD4 count although low is better than her prior values ( more than 200) ?- Will need MRI brain wwo contrast to evaluate for structural lesions  ?-Will get Crypto ag given headaches, RPR ?- Neurology consult, she has h/o seizure d/o previously and was stopped of anti-seizure meds.  ?- EEG ?- Fu HIV RNA and CD4 ? ?Addendum - MRI brain reviewed ?Will start IV bactrim for possible relapse of CNS toxoplasmosis as Pyrimethamine/sulfadiazine/leucovorin not stocked in pharmacy ? ?Dr Baxter Flattery to check this weekend. I am back Monday  ? ?Rest of the management as per the primary team. Please call with questions or concerns.  ?Thank you for the consult ? ?Rosiland Oz, MD ?Infectious Disease Physician ?[facility] ?[address] ?[place], [place] [address] ?Phone: [phone]  Fax:  [phone] ? 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 Patient was laying in the bed, husband saw she was not breathing and making a weird noise, seizure lasted  for approx 5 mins. Husband moved patient to floor ,and responded later to verbal stimuli. Patient is poor historian but tells me she has previous seizure before her brain surgery 05/2020 and was on meds for a while then it was stopped after seizure free for more than a year ? ?She was having headache for last 2 days and had poor sleep. Only thing she remembers is going to sleep at 8:30 pm the day admit and waking up later seeing a lot of people around her and ambulance being called. Drinks alcohol occasionally. Drinked Wednesday night. She also has dizziness. Denies fevers and chills, nausea and vomiting. Appetite  is good. Denies abdominal pain and diarrhea. Denies hearing changes or blurry vision.  ? ?Moved to [place] from 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 Lives with husband, no children. Does not work, husband works as a Mining engineer. No pets. No recent travel or out of country travel. No recent sick contact. Denies smoking, alcohol and IVDU ? ?ROS: all systems reviewed with pertinent positives and negatives as listed above ? ?Past Medical History:  ?Diagnosis Date  ? 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 Adrenal  insufficiency ([place])   ? Anemia of chronic disease 03/11/2012  ? Back pain of lumbar region with sciatica 02/12/2015  ? Bell's palsy 08/26/2013  ? Brain lesion   ? Bullae 05/30/2012  ? Chronic back pain   ? Chronic leg pain   ? bilateral knees, ankles  ? Depression   ? Fatigue   ? GERD (gastroesophageal reflux disease)   ? Headache   ? 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 HIV (human immunodeficiency virus infection) ([place]) 02/2012  ? Laceration of ankle, right 11/18/2012  ? Lumbar radiculopathy   ? Meningitis 02/18/2018  ? Pelvic pain   ? PID (acute pelvic inflammatory disease) 02/26/2018  ? Pneumonia   ? Reflux esophagitis 03/11/2012  ? Seizure ([place])   ? Tuberculosis   ? 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 Vertigo   ? Wears glasses   ? ?Past Surgical History:  ?Procedure Laterality Date  ? APPENDECTOMY  ~ 2000  ? APPLICATION OF CRANIAL NAVIGATION N/A 05/16/2020  ? Procedure: APPLICATION OF CRANIAL NAVIGATION;  Surgeon: Newman Pies, MD;  Location: [facility];  Service: Neurosurgery;  Laterality: N/A;  ? CRANIOTOMY Right 05/16/2020  ? Procedure: Craniotomy for Resection of Lesion;  Surgeon: Newman Pies, MD;  Location: [facility];  Service: Neurosurgery;  Laterality: Right;  right  ? DILATION AND CURETTAGE OF UTERUS  2008  ? ESOPHAGOGASTRODUODENOSCOPY  03/11/2012  ? Procedure: ESOPHAGOGASTRODUODENOSCOPY (EGD);  Surgeon: Lafayette Dragon, MD;  Location: [facility] ENDOSCOPY;  Service: Endoscopy;  Laterality: N/A;  ? ESOPHAGOGASTRODUODENOSCOPY N/A 03/07/2014  ? Procedure: ESOPHAGOGASTRODUODENOSCOPY (EGD);  Surgeon: Gatha Mayer, MD;  Location: [facility] ENDOSCOPY;  Service: Endoscopy;  Laterality: N/A;  ? LUNG BIOPSY  02/2012  ? TOTAL HIP ARTHROPLASTY Left 12/14/2015  ? Procedure: LEFT TOTAL HIP ARTHROPLASTY ANTERIOR APPROACH;  Surgeon: Mc[place] Rossetti, MD;  Location: [facility];  Service: Orthopedics;  Laterality: Left;  ? TOTAL HIP ARTHROPLASTY Right 04/04/2016  ? Procedure: RIGHT TOTAL HIP ARTHROPLASTY ANTERIOR APPROACH;   Surgeon: Mcarthur Rossetti, MD;  Location: [facility];  Service: Orthopedics;  Laterality: Right;  ? ? ? ?Scheduled Meds: ? atovaquone  1,500 mg Oral BID WC  ? bictegravir-emtricitabine-tenofovir AF  1 tablet Oral Daily  ? docusate sodium  100 mg Oral BID  ? levETIRAcetam  500 mg Oral BID  ? polyethylene glycol  17 g Oral Daily  ? senna  1 tablet Oral BID  ? ?Continuous Infusions: ?PRN Meds:.acetaminophen **OR** acetaminophen, LORazepam ? ?Allergies  ?Allergen Reactions  ? Hydrocodone Itching and Nausea Only  ?  Tolerates Oxycodone  ? Tramadol Itching and Nausea Only  ?  Tolerates oxycodone  ? ?Social History  ? ?Socioeconomic History  ? Marital status: Married  ?  Spouse name: Not on file  ? Number of children: 0  ? Years of education: college  ? Highest education level: High school graduate  ?Occupational History  ? Occupation: CNA  ?  Tobacco Use  ? Smoking status: Never  ? Smokeless tobacco: Never  ?Vaping Use  ? Vaping Use: Never used  ?Substance and Sexual Activity  ? Alcohol use: Not Currently  ?  Comment: socially  ? Drug use: No  ? Sexual activity: Yes  ?  Partners: Male  ?  Birth control/protection: None  ?Other Topics Concern  ? Not on file  ?Social History Narrative  ? 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    ? Drinks about 1 soda a day   ? ?Social Determinants of Health  ? ?Financial Resource Strain: Not on file  ?Food Insecurity: Not on file  ?Transportation Needs: Not on file  ?Physical Activity: Not on file  ?Stress: Not on file  ?Social Connections: Not on file  ?Intimate Partner Violence: Not on file  ? ?Vitals ?BP 123/85   Pulse 91   Temp 99 ?F (37.2 ?C) (Oral)   Resp 13   LMP 05/28/2021 (Approximate)   SpO2 100%  ? ? ?Physical Exam ?Constitutional:  lying in the bed, very minimal participation in history ?   Comments:  ? ?Cardiovascular:  ?   Rate and Rhythm: Normal rate and regular rhythm.  ?   Heart sounds:  ? ?Pulmonary:  ?   Effort: Pulmonary effort is normal on room air ?   Comments:  Bilateral equal air entry  ? ?Abdominal:  ?   Palpations: Abdomen is soft.  ?   Tenderness: non distended and non tender  ? ?Musculoskeletal:     ?   General: No swelling or tenderness.  ? ?Skin: ?   Comments:  ? ?Neurological:  ?   General: Grossly non focal, awak

## 2022-04-11 NOTE — ED Triage Notes (Signed)
Pt bib [facility] from home c/o seizures. Pt was laying in bed, husband saw pt was not breathing, making a weird noise, and was convulsing for approx. 5 minutes. Husband move pt to the floor, pt was post-ictal, on responsive to verbal stimuli. Pt has hx of seizures, last one a year ago. Pt also had brain surgery 1 year ago. Not on any medication for seizures. Pt had alcohol earlier today, Husband says she has had a drink every night per EMS. Pt c/o head ache located in the back of her head that started yesterday.  ? ?EMS vitals ?130 HR ?156/110 BP ?120 CBG ?98% O2 ? ?

## 2022-04-11 NOTE — Progress Notes (Signed)
Pharmacy Antibiotic Note ? ?Kelsey Rodriguez is a 31 y.o. female admitted on 04/11/2022 with concern for recurrent CNS toxoplasmosis. Pharmacy has been consulted for Bactrim dosing.  ? ?The patient has history of CNS toxoplasmosis with recurrence - previously requiring treatment. MRI this admission with increased ring-enhancing lesions. ? ?Plan: ?- Start Sulfamethoxazole-Trimethoprim 415 mg (~5 mg/kg TMP component) every 12 hours ?- Will continue to follow renal function for any necessary dose adjustments  ? ?  ? ?Temp (24hrs), Avg:99 ?F (37.2 ?C), Min:99 ?F (37.2 ?C), Max:99 ?F (37.2 ?C) ? ?Recent Labs  ?Lab 04/11/22 ?0333 04/11/22 ?0518  ?WBC 7.4  --   ?CREATININE 1.00  --   ?LATICACIDVEN 2.2* 1.3  ?  ?Estimated Creatinine Clearance: 83.4 mL/min (by C-G formula based on SCr of 1 mg/dL).   ? ?Allergies  ?Allergen Reactions  ? Hydrocodone Itching and Nausea Only  ?  Tolerates Oxycodone  ? Tramadol Itching and Nausea Only  ?  Tolerates oxycodone  ? ? ?Antimicrobials this admission: ?SMX/TMP 4/28 >> ? ?Dose adjustments this admission: ?N/a ? ?Microbiology results: ?4/28 BCx >> ?4/28 UCx >> ? ?Thank you for allowing pharmacy to be a part of this patient?s care. ? ?Alycia Rossetti, PharmD, BCPS ?Infectious Diseases Clinical Pharmacist ?04/11/2022 1:55 PM  ? ?**Pharmacist phone directory can now be found on [email or web address] ([email or web address]).  Listed under [place].  ? ?

## 2022-04-11 NOTE — Evaluation (Signed)
Physical Therapy Evaluation ?Patient Details ?Name: Kelsey Rodriguez ?MRN: [id number] ?DOB: 09/10/1991 ?Today's Date: 04/11/2022 ? ?History of Present Illness ? Pt is a 31 y/o female admitted secondary to seizure activity. 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  ?Clinical Impression ? Pt admitted secondary to problem above with deficits below. Reporting soreness, but overall tolerated mobility well. Required supervision for safety for transfers and gait. Educated about walking program to perform at home. Pt reports husband can assist if needed. No further skilled PT needs at this time. Will sign off. If needs change, please re-consult.  ?   ? ?Recommendations for follow up therapy are one component of a multi-disciplinary discharge planning process, led by the attending physician.  Recommendations may be updated based on patient status, additional functional criteria and insurance authorization. ? ?Follow Up Recommendations No PT follow up ? ?  ?Assistance Recommended at Discharge Intermittent Supervision/Assistance  ?Patient can return home with the following ? Assist for transportation;Assistance with cooking/housework ? ?  ?Equipment Recommendations None recommended by PT  ?Recommendations for Other Services ?    ?  ?Functional Status Assessment Patient has had a recent decline in their functional status and demonstrates the ability to make significant improvements in function in a reasonable and predictable amount of time.  ? ?  ?Precautions / Restrictions Precautions ?Precautions: Other (comment) ?Precaution Comments: Seizure ?Restrictions ?Weight Bearing Restrictions: No  ? ?  ? ?Mobility ? Bed Mobility ?Overal bed mobility: Modified Independent ?  ?  ?  ?  ?  ?  ?General bed mobility comments: Increased time, but no physical assist required ?  ? ?Transfers ?Overall transfer level: Needs assistance ?Equipment used: None ?Transfers: Sit to/from Stand ?Sit to Stand: Supervision ?   ?  ?  ?  ?  ?General transfer comment: supervision to stand from higher stretcher height ?  ? ?Ambulation/Gait ?Ambulation/Gait assistance: Supervision ?Gait Distance (Feet): 125 Feet ?Assistive device: None ?Gait Pattern/deviations: Step-through pattern, Decreased stride length ?Gait velocity: Decreased ?  ?  ?General Gait Details: Slow, cautious gait, but no overt LOB noted. Educated about walking program for home. ? ?Stairs ?  ?  ?  ?  ?  ? ?Wheelchair Mobility ?  ? ?Modified Rankin (Stroke Patients Only) ?  ? ?  ? ?Balance Overall balance assessment: Mild deficits observed, not formally tested ?  ?  ?  ?  ?  ?  ?  ?  ?  ?  ?  ?  ?  ?  ?  ?  ?  ?  ?   ? ? ? ?Pertinent Vitals/Pain Pain Assessment ?Pain Assessment: 0-10 ?Pain Score: 8  ?Pain Location: full body soreness ?Pain Descriptors / Indicators: Sore ?Pain Intervention(s): Limited activity within patient's tolerance, Monitored during session, Repositioned  ? ? ?Home Living Family/patient expects to be discharged to:: Private residence ?Living Arrangements: Spouse/significant other ?Available Help at Discharge: Family ?Type of Home: House ?Home Access: Stairs to enter ?Entrance Stairs-Rails: None ?Entrance Stairs-Number of Steps: 1 ?  ?Home Layout: One level ?Home Equipment: Conservation officer, nature (2 wheels);Cane - single point ?   ?  ?Prior Function Prior Level of Function : Independent/Modified Independent;Driving ?  ?  ?  ?  ?  ?  ?  ?  ?  ? ? ?Hand Dominance  ?   ? ?  ?Extremity/Trunk Assessment  ? Upper Extremity Assessment ?Upper Extremity Assessment: Overall WFL for tasks assessed ?  ? ?Lower Extremity Assessment ?Lower Extremity Assessment:  Overall [facility] for tasks assessed ?  ? ?Cervical / Trunk Assessment ?Cervical / Trunk Assessment: Normal  ?Communication  ? Communication: No difficulties  ?Cognition Arousal/Alertness: Awake/alert ?Behavior During Therapy: [facility] for tasks assessed/performed ?Overall Cognitive Status: Within Functional Limits for tasks  assessed ?  ?  ?  ?  ?  ?  ?  ?  ?  ?  ?  ?  ?  ?  ?  ?  ?  ?  ?  ? ?  ?General Comments   ? ?  ?Exercises    ? ?Assessment/Plan  ?  ?PT Assessment Patient does not need any further PT services  ?PT Problem List   ? ?   ?  ?PT Treatment Interventions     ? ?PT Goals (Current goals can be found in the Care Plan section)  ?Acute Rehab PT Goals ?Patient Stated Goal: to go home ?PT Goal Formulation: With patient ?Time For Goal Achievement: 04/11/22 ?Potential to Achieve Goals: Good ? ?  ?Frequency   ?  ? ? ?Co-evaluation   ?  ?  ?  ?  ? ? ?  ?AM-PAC PT "6 Clicks" Mobility  ?Outcome Measure Help needed turning from your back to your side while in a flat bed without using bedrails?: None ?Help needed moving from lying on your back to sitting on the side of a flat bed without using bedrails?: None ?Help needed moving to and from a bed to a chair (including a wheelchair)?: None ?Help needed standing up from a chair using your arms (e.g., wheelchair or bedside chair)?: None ?Help needed to walk in hospital room?: None ?Help needed climbing 3-5 steps with a railing? : A Little ?6 Click Score: 23 ? ?  ?End of Session   ?Activity Tolerance: Patient tolerated treatment well ?Patient left: in bed;with call bell/phone within reach (on stretcher in ED) ?Nurse Communication: Mobility status ?PT Visit Diagnosis: Other abnormalities of gait and mobility (R26.89) ?  ? ?Time: 6834-1962 ?PT Time Calculation (min) (ACUTE ONLY): 13 min ? ? ?Charges:   PT Evaluation ?$PT Eval Low Complexity: 1 Low ?  ?  ?   ? ? ?Reuel Derby, PT, DPT  ?Acute Rehabilitation Services  ?Pager: [phone] ?Office: [phone] ? ? ?[place] ?04/11/2022, 4:09 PM ?

## 2022-04-11 NOTE — Progress Notes (Signed)
Consulted ID Dr. Linus Salmons for assistance with management and recommendations for HIV/AIDS medications given patient's intermittent compliance and recent addition into research trial. ? ? ?Kelsey Thomley, DO  ?

## 2022-04-12 ENCOUNTER — Encounter (HOSPITAL_COMMUNITY): Payer: Self-pay | Admitting: Student

## 2022-04-12 ENCOUNTER — Observation Stay: Payer: Self-pay

## 2022-04-12 DIAGNOSIS — I10 Essential (primary) hypertension: Secondary | ICD-10-CM | POA: Diagnosis present

## 2022-04-12 DIAGNOSIS — F322 Major depressive disorder, single episode, severe without psychotic features: Secondary | ICD-10-CM | POA: Diagnosis present

## 2022-04-12 DIAGNOSIS — R002 Palpitations: Secondary | ICD-10-CM | POA: Diagnosis not present

## 2022-04-12 DIAGNOSIS — B0089 Other herpesviral infection: Secondary | ICD-10-CM | POA: Diagnosis present

## 2022-04-12 DIAGNOSIS — M8788 Other osteonecrosis, other site: Secondary | ICD-10-CM | POA: Diagnosis present

## 2022-04-12 DIAGNOSIS — Z79899 Other long term (current) drug therapy: Secondary | ICD-10-CM | POA: Diagnosis not present

## 2022-04-12 DIAGNOSIS — R079 Chest pain, unspecified: Secondary | ICD-10-CM | POA: Diagnosis not present

## 2022-04-12 DIAGNOSIS — Z8611 Personal history of tuberculosis: Secondary | ICD-10-CM | POA: Diagnosis not present

## 2022-04-12 DIAGNOSIS — G8929 Other chronic pain: Secondary | ICD-10-CM | POA: Diagnosis present

## 2022-04-12 DIAGNOSIS — R569 Unspecified convulsions: Secondary | ICD-10-CM | POA: Diagnosis present

## 2022-04-12 DIAGNOSIS — B589 Toxoplasmosis, unspecified: Secondary | ICD-10-CM

## 2022-04-12 DIAGNOSIS — Z87410 Personal history of cervical dysplasia: Secondary | ICD-10-CM | POA: Diagnosis not present

## 2022-04-12 DIAGNOSIS — Z96643 Presence of artificial hip joint, bilateral: Secondary | ICD-10-CM | POA: Diagnosis present

## 2022-04-12 DIAGNOSIS — B20 Human immunodeficiency virus [HIV] disease: Secondary | ICD-10-CM | POA: Diagnosis present

## 2022-04-12 DIAGNOSIS — Z91148 Patient's other noncompliance with medication regimen for other reason: Secondary | ICD-10-CM | POA: Diagnosis not present

## 2022-04-12 DIAGNOSIS — Z202 Contact with and (suspected) exposure to infections with a predominantly sexual mode of transmission: Secondary | ICD-10-CM | POA: Diagnosis present

## 2022-04-12 DIAGNOSIS — Z886 Allergy status to analgesic agent status: Secondary | ICD-10-CM | POA: Diagnosis not present

## 2022-04-12 DIAGNOSIS — Z885 Allergy status to narcotic agent status: Secondary | ICD-10-CM | POA: Diagnosis not present

## 2022-04-12 DIAGNOSIS — Z8249 Family history of ischemic heart disease and other diseases of the circulatory system: Secondary | ICD-10-CM | POA: Diagnosis not present

## 2022-04-12 DIAGNOSIS — N7093 Salpingitis and oophoritis, unspecified: Secondary | ICD-10-CM | POA: Diagnosis present

## 2022-04-12 DIAGNOSIS — G936 Cerebral edema: Secondary | ICD-10-CM | POA: Diagnosis present

## 2022-04-12 DIAGNOSIS — F419 Anxiety disorder, unspecified: Secondary | ICD-10-CM | POA: Diagnosis present

## 2022-04-12 DIAGNOSIS — E271 Primary adrenocortical insufficiency: Secondary | ICD-10-CM | POA: Diagnosis present

## 2022-04-12 DIAGNOSIS — G40909 Epilepsy, unspecified, not intractable, without status epilepticus: Secondary | ICD-10-CM | POA: Diagnosis present

## 2022-04-12 LAB — CBC
HCT: 35.5 % — ABNORMAL LOW (ref 36.0–46.0)
Hemoglobin: 11.5 g/dL — ABNORMAL LOW (ref 12.0–15.0)
MCH: 30.4 pg (ref 26.0–34.0)
MCHC: 32.4 g/dL (ref 30.0–36.0)
MCV: 93.9 fL (ref 80.0–100.0)
Platelets: 201 10*3/uL (ref 150–400)
RBC: 3.78 MIL/uL — ABNORMAL LOW (ref 3.87–5.11)
RDW: 15.6 % — ABNORMAL HIGH (ref 11.5–15.5)
WBC: 5.2 10*3/uL (ref 4.0–10.5)
nRBC: 0 % (ref 0.0–0.2)

## 2022-04-12 LAB — LIPID PANEL
Cholesterol: 234 mg/dL — ABNORMAL HIGH (ref <200)
HDL: 74 mg/dL (ref >=50)
LDL Cholesterol (Calc): 143 mg/dL (calc) — ABNORMAL HIGH
Non-HDL Cholesterol (Calc): 160 mg/dL (calc) — ABNORMAL HIGH (ref <130)
Total CHOL/HDL Ratio: 3.2 (calc) (ref <5.0)
Triglycerides: 73 mg/dL (ref <150)

## 2022-04-12 LAB — BASIC METABOLIC PANEL
Anion gap: 5 (ref 5–15)
BUN: 9 mg/dL (ref 6–20)
CO2: 23 mmol/L (ref 22–32)
Calcium: 8.7 mg/dL — ABNORMAL LOW (ref 8.9–10.3)
Chloride: 111 mmol/L (ref 98–111)
Creatinine, Ser: 0.83 mg/dL (ref 0.44–1.00)
GFR, Estimated: >60 mL/min (ref >60)
Glucose, Bld: 81 mg/dL (ref 70–99)
Potassium: 4.2 mmol/L (ref 3.5–5.1)
Sodium: 139 mmol/L (ref 135–145)

## 2022-04-12 LAB — CRYPTOCOCCAL ANTIGEN: Crypto Ag: NEGATIVE

## 2022-04-12 LAB — RPR
RPR Ser Ql: REACTIVE — AB
RPR Titer: 1:2 {titer}

## 2022-04-12 LAB — HEPATIC FUNCTION PANEL
AG Ratio: 1.2 (calc) (ref 1.0–2.5)
ALT: 15 U/L (ref 6–29)
AST: 15 U/L (ref 10–30)
Albumin: 4.4 g/dL (ref 3.6–5.1)
Alkaline phosphatase (APISO): 80 U/L (ref 31–125)
Bilirubin, Direct: 0.1 mg/dL (ref 0.0–0.2)
Globulin: 3.6 g/dL (calc) (ref 1.9–3.7)
Indirect Bilirubin: 0.2 mg/dL (calc) (ref 0.2–1.2)
Total Bilirubin: 0.3 mg/dL (ref 0.2–1.2)
Total Protein: 8 g/dL (ref 6.1–8.1)

## 2022-04-12 LAB — URINE CULTURE

## 2022-04-12 LAB — MAGNESIUM: Magnesium: 2.1 mg/dL (ref 1.7–2.4)

## 2022-04-12 LAB — HIV-1 RNA QUANT-NO REFLEX-BLD
HIV 1 RNA Quant: 208 copies/mL — ABNORMAL HIGH
HIV-1 RNA Quant, Log: 2.32 Log copies/mL — ABNORMAL HIGH

## 2022-04-12 MED ORDER — SODIUM CHLORIDE 0.9% FLUSH
10.0000 mL | Freq: Two times a day (BID) | INTRAVENOUS | Status: DC
  Administered 2022-04-12 – 2022-04-17 (×9): 10 mL

## 2022-04-12 MED ORDER — CHLORHEXIDINE GLUCONATE CLOTH 2 % EX PADS
6.0000 | MEDICATED_PAD | Freq: Every day | CUTANEOUS | Status: DC
  Administered 2022-04-12 – 2022-04-17 (×6): 6 via TOPICAL

## 2022-04-12 MED ORDER — SODIUM CHLORIDE 0.9 % IV SOLN: INTRAVENOUS | Status: DC | PRN

## 2022-04-12 MED ORDER — SODIUM CHLORIDE 0.9% FLUSH: 10.0000 mL | INTRAVENOUS | Status: DC | PRN

## 2022-04-12 NOTE — Procedures (Addendum)
Indication: Seizure activity, history of CNS toxoplasmosis ? ?Risks of the procedure were dicussed with the patient including post-LP headache, bleeding, infection, weakness/numbness of legs(radiculopathy).  The patient/patient's proxy agreed and written consent was obtained.  ?Performing physisican: Dr. Lesleigh Noe ?Assistant: Dr. Pearla Dubonnet ? ?The patient was prepped and draped using sterile technique. Bony landmarks palpated and L3-L4 intervertebral space identified. The skin was anesthetized using 1% lidocaine, and as the needle was advanced, bloody backflow into the lidocaine-containing syringe was noted and bony resistance was met. The patient appeared to be in a great deal of discomfort with numbing procedure, and was having a great deal of difficulty staying still.  Per patient's request, the decision was made to pursue the procedure under fluoroscopic guidance with IR's assistance rather than attempting again at a different intervertebral space. ? ?- Consult to IR For fluoroscopy-guided LP ? ?Pearla Dubonnet, MD ?1:49 PM ? ?I supervised the resident for this procedure and performed the needle placement myself per patient's request ? ?Lesleigh Noe MD-PhD ?[facility] ?[phone]  ?Available 7 AM to 7 PM, outside these hours please contact Neurologist on call listed on AMION  ?

## 2022-04-12 NOTE — Progress Notes (Signed)
A consult was placed to IV Therapy again for another new iv site;  pt states "I've had midlines and piccs before."   Attempted 2 more times , this time with ultrasound, but was not able to thread the catheter either time;  MDs at bedside now preparing to do a spinal tap;  will have another IV RN assess later.   Current site working near [employer], but is very positional.  ?

## 2022-04-12 NOTE — Progress Notes (Signed)
Family Medicine Teaching Service ?Daily Progress Note ?Intern Pager: [phone] ? ?Patient name: Kelsey Rodriguez Medical record number: [id number] ?Date of birth: 02-02-1991 Age: 31 y.o. Gender: female ? ?Primary Care Provider: Gerlene Fee, DO ?Consultants: ID, Neurology ?Code Status: Full ? ?Pt Overview and Major Events to Date:  ?4/28 - admitted with seizure, MRI abnormal and neuro consulted. ID consulted. ? ?Assessment and Plan: ?Kelsey Rodriguez is a 31 y.o. female presenting to [facility] after generalized tonic-clonic seizure. 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 ? ?Generalized tonic clonic seizure ?Concern at this time that the general tonic-clonic seizure could be related to reactivation of CNS toxoplasmosis vs immune reconstitution inflammatory syndrome. ?- Neurology following, appreciate recs ?- Keppra '500mg'$  BID for seizure prophylaxis ?- Plan for LP today with neurology ?- See below for HIV management ?- Continue to monitor for further seizure activity ?- Ativan as needed for seizure > 5 minutes ? ?HIV/AIDS ?Medication noncompliance ?Hx of CNS toxoplasmosis ?Concern for reactivation of toxoplasmosis vs IRIS.  Cryptococcal antigen was negative.  HIV quant from admission 208. ?- ID following, appreciate recs ?- Neurology following, appreciate recs ?- Continue Biktarvy ?- F/u CD4 count ?- Follow-up RPR ?- Continue IV Bactrim ?- Repeat brain MRI W and WO in 2 weeks to monitor lesions ?- LP with lab evaluations ? ?FEN/GI: Regular ?PPx: SCDs ?Dispo:Home in 2-3 days. Barriers include continued medical work-up.  ? ?Subjective:  ?Patient has no complaints this morning.  She overall is feeling okay.  She does report the headaches that have been persistent for her recently with no acute changes.  Patient  reports that she is having bowel movements and has been taking her stool softener. ? ?Objective: ?Temp:  [98 ?F (36.7 ?C)-98.7 ?F (37.1 ?C)] 98 ?F (36.7 ?C) (04/29 0304) ?Pulse Rate:  [73-88] 84 (04/29 0304) ?Resp:  [14-21] 17 (04/29 0602) ?BP: (96-124)/(66-94) 96/67 (04/29 0400) ?SpO2:  [100 %] 100 % (04/29 0304) ?Physical Exam: ?General: NAD, supine in bed, resting comfortably ?Cardiovascular: RRR, no murmur appreciated ?Respiratory: Breathing comfortably on room air, CTAB ?Abdomen: Soft, nontender, nondistended ?Extremities: Moving all extremities equally and appropriately ? ?Laboratory: ?Recent Labs  ?Lab 04/11/22 ?0333 04/12/22 ?0128  ?WBC 7.4 5.2  ?HGB 11.8* 11.5*  ?HCT 35.5* 35.5*  ?PLT 214 201  ? ?Recent Labs  ?Lab 04/09/22 ?0950 04/11/22 ?0333 04/12/22 ?0128  ?NA  --  137 139  ?K  --  4.0 4.2  ?CL  --  108 111  ?CO2  --  23 23  ?BUN  --  14 9  ?CREATININE  --  1.00 0.83  ?CALCIUM  --  9.1 8.7*  ?PROT 8.0 7.9  --   ?BILITOT 0.3 0.3  --   ?ALKPHOS  --  19  --   ?ALT 15 18  --   ?AST 15 24  --   ?GLUCOSE  --  81 81  ? ? ?Imaging/Diagnostic Tests: ?MR Brain W and Wo Contrast ?IMPRESSION: Interval increase in size of pre-existing ring-enhancing lesions in the left frontal lobe with increase the surrounding vasogenic edema in a patient with previous diagnosis of toxoplasmosis. This may be seen in the setting of drug resistance versus immune reconstitution. Clinical/laboratory correlation necessary. Electronically Signed   By: Pedro Earls M.D.   On: 04/11/2022 11:33  ? ?EEG adult ?IMPRESSION: This study is  within normal limits. No seizures or epileptiform discharges were seen throughout the recording. Priyanka Barbra Sarks    ? ?Rise Patience, DO ?04/12/2022, 8:42 AM ?PGY-2, [place] ?[place] Intern pager: [phone], text pages welcome ? ?

## 2022-04-12 NOTE — Progress Notes (Addendum)
Per Jeanette Caprice RN Pt has PIV for current medications ordered. PICC line ordered for future needs.  ?

## 2022-04-12 NOTE — Progress Notes (Signed)
?  [place] for Infectious Disease   ? ?Date of Admission:  04/11/2022   Total days of antibiotics 2 ? ? ?ID: Dawne Bih Magos is a 31 y.o. female with  hiv disease and CNS toxo, possibly IRIS vs. Recurrence vs other CNS infection ?Principal Problem: ?  Seizure ([place]) ? ? ? ?Subjective: ?Afebrile, getting LP ? ?Medications:  ? bictegravir-emtricitabine-tenofovir AF  1 tablet Oral Daily  ? docusate sodium  100 mg Oral BID  ? levETIRAcetam  500 mg Oral BID  ? polyethylene glycol  17 g Oral Daily  ? senna  1 tablet Oral BID  ? ? ?Objective: ?Vital signs in last 24 hours: ?Temp:  [97.3 ?F (36.3 ?C)-98.7 ?F (37.1 ?C)] 97.3 ?F (36.3 ?C) (04/29 0700) ?Pulse Rate:  [84-88] 84 (04/29 0304) ?Resp:  [14-21] 14 (04/29 0700) ?BP: (96-124)/(63-89) 108/63 (04/29 0700) ?SpO2:  [100 %] 100 % (04/29 0304) ? ?Physical Exam  ?Constitutional:  oriented to person, place, and time. appears well-developed and well-nourished. No distress.  ?HENT: [place]/AT, PERRLA, no scleral icterus ?Mouth/Throat: Oropharynx is clear and moist. No oropharyngeal exudate.  ?Cardiovascular: Normal rate, regular rhythm and normal heart sounds. Exam reveals no gallop and no friction rub.  ?No murmur heard.  ?Pulmonary/Chest: Effort normal and breath sounds normal. No respiratory distress.  has no wheezes.  ?Neck = supple, no nuchal rigidity ?Abdominal: Soft. Bowel sounds are normal.  exhibits no distension. There is no tenderness.  ?Lymphadenopathy: no cervical adenopathy. No axillary adenopathy ?Neurological: alert and oriented to person, place, and time.  ?Skin: Skin is warm and dry. No rash noted. No erythema.  ?Psychiatric: a normal mood and affect.  behavior is normal.  ? ? ?Lab Results ?Recent Labs  ?  04/11/22 ?0333 04/12/22 ?0128  ?WBC 7.4 5.2  ?HGB 11.8* 11.5*  ?HCT 35.5* 35.5*  ?NA 137 139  ?K 4.0 4.2  ?CL 108 111  ?CO2 23 23  ?BUN 14 9  ?CREATININE 1.00 0.83  ? ?Liver Panel ?Recent Labs  ?  04/11/22 ?0333  ?PROT 7.9  ?ALBUMIN 3.9  ?AST 24  ?ALT  18  ?ALKPHOS 70  ?BILITOT 0.3  ? ?Sedimentation Rate ?No results for input(s): ESRSEDRATE in the last 72 hours. ?C-Reactive Protein ?No results for input(s): CRP in the last 72 hours. ? ?Microbiology: ?reviewed ?Studies/Results: ?CT HEAD WO CONTRAST (5MM) ? ?Result Date: 04/11/2022 ?CLINICAL DATA:  Meningitis/CNS infection suspected.  Seizures. EXAM: CT HEAD WITHOUT CONTRAST TECHNIQUE: Contiguous axial images were obtained from the base of the skull through the vertex without intravenous contrast. RADIATION DOSE REDUCTION: This exam was performed according to the departmental dose-optimization program which includes automated exposure control, adjustment of the mA and/or kV according to patient size and/or use of iterative reconstruction technique. COMPARISON:  09/07/2020 FINDINGS: Brain: Stable area of low-density within the right temporal lobe in area of prior biopsy/partial resection. No acute intracranial abnormality. Specifically, no hemorrhage, hydrocephalus, mass lesion, acute infarction, or significant intracranial injury. Vascular: No hyperdense vessel or unexpected calcification. Skull: No acute calvarial abnormality. Sinuses/Orbits: No acute findings Other: None IMPRESSION: No acute intracranial abnormality. Electronically Signed   By: Rolm Baptise M.D.   On: 04/11/2022 03:23  ? ?MR Brain W and Wo Contrast ? ?Result Date: 04/11/2022 ?CLINICAL DATA:  Head CT April 11, 2022. MRI of the brain September 14, 2021. EXAM: MRI HEAD WITHOUT AND WITH CONTRAST TECHNIQUE: Multiplanar, multiecho pulse sequences of the brain and surrounding structures were obtained without and with intravenous contrast. CONTRAST:  7.41m GADAVIST GADOBUTROL 1 MMOL/ML IV SOLN COMPARISON:  None. FINDINGS: Brain: Interval increase in size of the 2 left frontal ring-enhancing lesions (5 and 6 mm on series 3, images 38 and 25, compared to 3 and 5 mm on prior) with increase in the surrounding vasogenic edema. Slight increase edema in the right  occipital lobe with stable 6 mm dural-based enhancing lesion (series 14, image 1). Stable appearance of area of encephalomalacia and gliosis in the right temporal lobe related to prior surgical manipulation. A few nonspecific T2 hyperintense lesions of the white matter remain unchanged. No acute infarct, hemorrhage or hydrocephalus. Vascular: Normal flow voids. Skull and upper cervical spine: Normal marrow signal. Sinuses/Orbits: Mild mucosal thickening scattered throughout the paranasal sinuses. The orbits are maintained. Other: None. IMPRESSION: Interval increase in size of pre-existing ring-enhancing lesions in the left frontal lobe with increase the surrounding vasogenic edema in a patient with previous diagnosis of toxoplasmosis. This may be seen in the setting of drug resistance versus immune reconstitution. Clinical/laboratory correlation necessary. Electronically Signed   By: KPedro EarlsM.D.   On: 04/11/2022 11:33  ? ?DG Chest Port 1 View ? ?Result Date: 04/11/2022 ?CLINICAL DATA:  Seizure, possible sepsis EXAM: PORTABLE CHEST 1 VIEW COMPARISON:  07/11/2020 FINDINGS: Lungs are clear.  No pleural effusion or pneumothorax. The heart is normal in size. IMPRESSION: No evidence of acute cardiopulmonary disease. Electronically Signed   By: SJulian HyM.D.   On: 04/11/2022 03:11  ? ?EEG adult ? ?Result Date: 04/11/2022 ?YLora Havens MD     04/11/2022  2:32 PM Patient Name: BAradhana GinMRN: [id number] Attending: PLora HavensReferring Physician/Provider: JFrance Ravens MD Date: 04/11/2022 Duration: 23.05 mins Patient history: 31y.o. female presenting with seizure. She had a generalized tonic-clonic seizure lasting about 5 minutes. EEG to evaluate for seizure. Level of alertness: Awake, asleep AEDs during EEG study: None Technical aspects: This EEG study was done with scalp electrodes positioned according to the 10-20 International system of electrode placement.  Electrical activity was acquired at a sampling rate of '500Hz'$  and reviewed with a high frequency filter of '70Hz'$  and a low frequency filter of '1Hz'$ . EEG data were recorded continuously and digitally stored. Description: The posterior dominant rhythm consists of 10 Hz activity of moderate voltage (25-35 uV) seen predominantly in posterior head regions, symmetric and reactive to eye opening and eye closing. Sleep was characterized by vertex waves, sleep spindles (12 to 14 Hz), maximal frontocentral region. Hyperventilation and photic stimulation were not performed.   IMPRESSION: This study is within normal limits. No seizures or epileptiform discharges were seen throughout the recording. Priyanka OBarbra Sarks  ? ? 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 ?Seizure = being followed by neuro. Continues on keppra. Agree with plan for LP and work up to confirm that it is reactivation of txo vs. Other causes ? ?CNS ring enhancing lesion = recommend LP to check for EBV, toxo PCR, crypto, VDRL and CSF cytology ? ?HIV disease = cd 4 count of 259 (in march) and VL of 208 on this admission (down from 237K on 03/17/22) currently on biktarvy ? 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 If work up is more consistent with immune reactivation, then consider steroids as addn. ? ?Venous access =will get picc line placed. ? ? ? ?CCarlyle Basques?[facility] ?Pager: [phone] ? ?04/12/2022,  1:45 PM ? ? ? ? ? ?

## 2022-04-12 NOTE — Plan of Care (Signed)

## 2022-04-12 NOTE — Care Management Obs Status (Signed)
MEDICARE OBSERVATION STATUS NOTIFICATION ? ? ?Patient Details  ?Name: Kelsey Rodriguez ?MRN: [id number] ?Date of Birth: Dec 03, 1991 ? ? ?Medicare Observation Status Notification Given:  Yes ? ? ? ?Carles Collet, RN ?04/12/2022, 11:01 AM ?

## 2022-04-12 NOTE — Progress Notes (Signed)
FPTS Brief Progress Note ? ?S: Seen at bedside resting comfortably.  Sleeping so did not disturb. ? ? ?O: ?BP 112/80 (BP Location: Left Arm)   Pulse 86   Temp 98.2 ?F (36.8 ?C) (Oral)   Resp 20   LMP 05/28/2021 (Approximate)   SpO2 100%   ? ? ?A/P: ?-Plans per day team, ID, neurology: Was started on IV Bactrim for MRI findings concerning of relapse of CNS toxoplasmosis ?- Orders reviewed. Labs for AM ordered, which was adjusted as needed.  ?- If condition changes, plan includes bedside evaluation.  ? ?France Ravens, MD ?04/12/2022, 1:21 AM ?PGY-1, [place] Medicine Night Resident  ?Please page [phone] with questions.  ? ?

## 2022-04-12 NOTE — Progress Notes (Signed)
Peripherally Inserted Central Catheter Placement ? ?The IV Nurse has discussed with the patient and/or persons authorized to consent for the patient, the purpose of this procedure and the potential benefits and risks involved with this procedure.  The benefits include less needle sticks, lab draws from the catheter, and the patient may be discharged home with the catheter. Risks include, but not limited to, infection, bleeding, blood clot (thrombus formation), and puncture of an artery; nerve damage and irregular heartbeat and possibility to perform a PICC exchange if needed/ordered by physician.  Alternatives to this procedure were also discussed.  Bard Power PICC patient education guide, fact sheet on infection prevention and patient information card has been provided to patient /or left at bedside.   ? ?PICC Placement Documentation  ?PICC Single Lumen 67/89/38 Right Basilic 39 cm 1 cm (Active)  ?Indication for Insertion or Continuance of Line Home intravenous therapies (PICC only) 04/12/22 1832  ?Exposed Catheter (cm) 1 cm 04/12/22 1832  ?Site Assessment Clean, Dry, Intact 04/12/22 1832  ?Line Status Flushed;Saline locked;Blood return noted 04/12/22 1832  ?Dressing Type Securing device;Transparent 04/12/22 1832  ?Dressing Status Antimicrobial disc in place;Allergy to antimicrobial disc 04/12/22 1832  ?Safety Lock Not Applicable 09/30/50 0258  ?Line Care Connections checked and tightened 04/12/22 1832  ?Line Adjustment (NICU/IV Team Only) No 04/12/22 1832  ?Dressing Intervention New dressing 04/12/22 1832  ?Dressing Change Due 04/19/22 04/12/22 1832  ? ? ? ? ? ?Mickel Baas  Gennaro Lizotte ?04/12/2022, 6:35 PM ? ?

## 2022-04-13 ENCOUNTER — Inpatient Hospital Stay (HOSPITAL_COMMUNITY): Payer: Medicare Other

## 2022-04-13 DIAGNOSIS — R569 Unspecified convulsions: Secondary | ICD-10-CM | POA: Diagnosis not present

## 2022-04-13 DIAGNOSIS — B20 Human immunodeficiency virus [HIV] disease: Secondary | ICD-10-CM | POA: Diagnosis not present

## 2022-04-13 DIAGNOSIS — B589 Toxoplasmosis, unspecified: Secondary | ICD-10-CM | POA: Diagnosis not present

## 2022-04-13 LAB — HELPER T-LYMPH-CD4 (ARMC ONLY)
% CD 4 Pos. Lymph.: 14.7 % — ABNORMAL LOW (ref 30.8–58.5)
Absolute CD 4 Helper: 353 /uL — ABNORMAL LOW (ref 359–1519)
Basophils Absolute: 0 10*3/uL (ref 0.0–0.2)
Basos: 1 %
EOS (ABSOLUTE): 0.5 10*3/uL — ABNORMAL HIGH (ref 0.0–0.4)
Eos: 7 %
Hematocrit: 32.2 % — ABNORMAL LOW (ref 34.0–46.6)
Hemoglobin: 10.8 g/dL — ABNORMAL LOW (ref 11.1–15.9)
Immature Grans (Abs): 0 10*3/uL (ref 0.0–0.1)
Immature Granulocytes: 0 %
Lymphocytes Absolute: 2.4 10*3/uL (ref 0.7–3.1)
Lymphs: 33 %
MCH: 30.2 pg (ref 26.6–33.0)
MCHC: 33.5 g/dL (ref 31.5–35.7)
MCV: 90 fL (ref 79–97)
Monocytes Absolute: 0.5 10*3/uL (ref 0.1–0.9)
Monocytes: 7 %
Neutrophils Absolute: 3.8 10*3/uL (ref 1.4–7.0)
Neutrophils: 52 %
Platelets: 222 10*3/uL (ref 150–450)
RBC: 3.58 x10E6/uL — ABNORMAL LOW (ref 3.77–5.28)
RDW: 15.1 % (ref 11.7–15.4)
WBC: 7.2 10*3/uL (ref 3.4–10.8)

## 2022-04-13 LAB — PROTEIN, CSF: Total  Protein, CSF: 20 mg/dL (ref 15–45)

## 2022-04-13 LAB — BASIC METABOLIC PANEL
Anion gap: 7 (ref 5–15)
BUN: 9 mg/dL (ref 6–20)
CO2: 22 mmol/L (ref 22–32)
Calcium: 9 mg/dL (ref 8.9–10.3)
Chloride: 106 mmol/L (ref 98–111)
Creatinine, Ser: 0.77 mg/dL (ref 0.44–1.00)
GFR, Estimated: >60 mL/min (ref >60)
Glucose, Bld: 90 mg/dL (ref 70–99)
Potassium: 4.4 mmol/L (ref 3.5–5.1)
Sodium: 135 mmol/L (ref 135–145)

## 2022-04-13 LAB — CSF CELL COUNT WITH DIFFERENTIAL
RBC Count, CSF: 3 /mm3 — ABNORMAL HIGH
Tube #: 1
WBC, CSF: 4 /mm3 (ref 0–5)

## 2022-04-13 LAB — CRYPTOCOCCAL ANTIGEN, CSF: Crypto Ag: NEGATIVE

## 2022-04-13 LAB — GLUCOSE, CSF: Glucose, CSF: 54 mg/dL (ref 40–70)

## 2022-04-13 MED ORDER — LIP MEDEX EX OINT
TOPICAL_OINTMENT | CUTANEOUS | Status: DC | PRN
  Filled 2022-04-13: qty 7

## 2022-04-13 NOTE — Progress Notes (Signed)
Family Medicine Teaching Service ?Daily Progress Note ?Intern Pager: [phone] ? ?Patient name: Kelsey Rodriguez Medical record number: [id number] ?Date of birth: 08-Oct-1991 Age: 31 y.o. Gender: female ? ?Primary Care Provider: Gerlene Fee, DO ?Consultants: ID, neurology  ?Code Status: FULL  ? ?Pt Overview and Major Events to Date:  ?4/28 - admitted with seizure, MRI abnormal and neuro consulted. ID consulted. ?  ?Assessment and Plan: ?Kelsey Rodriguez is a 31 y.o. female presenting to [facility] after generalized tonic-clonic seizure. 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 ?  ?Generalized tonic clonic seizure ?Followed by neurology and ID, continuing with LP to check EBV, toxo PCR, crypto, VDRL, and CSF cytology. Most likely due to toxo reactivation vs IRIS, continuing work up for other causes. Continue bactrim and keppra, repeat MRI w/ w/o contrast in 2 weeks. Seizure activity controlled on Keppra. Possibly to get LP today, attempted yesterday but were unsuccessful.  ?-- Appreciate neurology and ID recs  ?- Continue IV Bactrim ?- Continue Keppra 500 mg twice daily ?- Continue with IR guided LP ?- Seizure precautions ?-Up with assistance ?- See below for HIV management ?- Ativan as needed for seizure >5 minutes ? ?HIV/AIDS ?Medication noncompliance ?History of CNS toxoplasmosis ?Concern for reactivation of toxoplasmosis versus IRIS.  Continuing with LP as above.  HIV RNA quant 208 this admission.  Patient has positive RPR with history of false positive.  Geographical information systems officer. ?- Continue Biktarvy ?- Continue IV Bactrim ?- LP to be performed ?- Needs PICC line ?- Await CD4 count ?- Appreciate ID assistance ?-Repeat brain MRI W/WO in 2 weeks to monitor lesions ? ?FEN/GI: Regular ?PPx: SCDs ?Dispo:Home in 2-3 days.  Barriers include LP results  ? ?Subjective:  ?Patient has slight headache this morning and is achy, otherwise without complaint.  ? ?Objective: ?Temp:  [97.9 ?F (36.6 ?C)-98.2 ?F (36.8 ?C)] 98.1 ?F (36.7 ?C) (04/30 3614) ?Pulse Rate:  [88-97] 93 (04/30 1100) ?Resp:  [17-18] 18 (04/30 1100) ?BP: (88-132)/(63-101) 103/63 (04/30 1100) ?SpO2:  [100 %] 100 % (04/30 0315) ?General: Alert and oriented in no apparent distress ?Heart: Regular rate and rhythm with no murmurs appreciated ?Lungs: CTA bilaterally, no wheezing ?Abdomen: Bowel sounds present, no abdominal pain ?Skin: Warm and dry ?Extremities: No lower extremity edema ? ? ?Laboratory: ?Recent Labs  ?Lab 04/11/22 ?0333 04/12/22 ?0128  ?WBC 7.4 5.2  ?HGB 11.8* 11.5*  ?HCT 35.5* 35.5*  ?PLT 214 201  ? ?Recent Labs  ?Lab 04/09/22 ?0950 04/11/22 ?0333 04/12/22 ?0128 04/13/22 ?4315  ?NA  --  137 139 135  ?K  --  4.0 4.2 4.4  ?CL  --  108 111 106  ?CO2  --  '23 23 22  '$ ?BUN  --  '14 9 9  '$ ?CREATININE  --  1.00 0.83 0.77  ?CALCIUM  --  9.1 8.7* 9.0  ?PROT 8.0 7.9  --   --   ?BILITOT 0.3 0.3  --   --   ?ALKPHOS  --  70  --   --   ?ALT 15 18  --   --   ?AST 15 24  --   --   ?GLUCOSE  --  81 81 90  ? ? ? ? ?Erskine Emery, MD ?04/13/2022, 1:03 PM ?PGY-1, [place] ?[place] Intern pager: [phone], text pages welcome ? ?

## 2022-04-13 NOTE — Progress Notes (Signed)
ID PROGRESS NOTE ? ?31yo F with HIV/AIDS, hx of toxo, currently CD 4 count of 2525/VL 208(April 2023) - had VL of 237K in March, now presents with seizure and MRI showing worsening ring enhancing lesions ? ?Underwent fluoroguided LP ? ?A/P: awaiting results from LP to help with course of treatment. Concern that she may need to be retreated for CNS toxo vs. Immune reconstitution. She was on atovaquone previously but had some intermittent periods of not taking medicaiton. Potentially could be Immune reconstitution since she had VL at 237K but now already down to 203. Await other studies to see if other cause for ring enhancing lesions ? ?For now, treat for CNS toxo with Iv bactrim to see how she tolerates. May need picc line ? ?HIV disease =continue with biktarvy ? ?Seizure = continue on keppra ? ? ?Kelsey Rings Baxter Flattery MD MPH ?[facility] ?[phone] ? ? ?

## 2022-04-13 NOTE — Progress Notes (Signed)
FPTS Brief Progress Note ? ?S: Patient sleeping ? ? ?O: ?BP 120/70 (BP Location: Left Arm)   Pulse 93   Temp 98.2 ?F (36.8 ?C) (Oral)   Resp 18   Ht '5\' 4"'$  (1.626 m)   Wt 83.6 kg   LMP 05/28/2021 (Approximate)   SpO2 100%   BMI 31.64 kg/m?   ? ? ?A/P: ?CNS toxoplasmosis relapse versus IRIS - cryptococcus Ag negative, await remainder of CSF studies to determine ultimate treatment plan ?- Orders reviewed. Labs for AM ordered, which was adjusted as needed.  ?- If condition changes, plan includes administering lorazepam for seizures greater than 5 minutes.  ? ?Zola Button, MD ?04/13/2022, 11:22 PM ?PGY-2, [facility] Night Resident  ?Please page [phone] with questions.  ?  ?

## 2022-04-13 NOTE — Progress Notes (Addendum)
FPTS Interim Progress Note ? ?S: Patient sleeping and resting comfortably.   ? ? ?O: ?BP (!) 88/65 (BP Location: Left Arm)   Pulse 92   Temp 97.9 ?F (36.6 ?C) (Oral)   Resp 17   Ht '5\' 4"'$  (1.626 m)   Wt 83.6 kg   LMP 05/28/2021 (Approximate)   SpO2 100%   BMI 31.64 kg/m?   ? ?GEN: resting  ?RESP: equal chest rise and fall ? ? ? ?A/P: ? ?No changes to current plan. See daily progress note.  ?- Orders reviewed. Labs for AM ordered, which was adjusted as needed.  ? Lyndee Hensen, DO ?04/13/2022,  ?PGY-3, [facility] Night Resident  ?Please page [phone] with questions.  ? ?

## 2022-04-13 NOTE — Procedures (Signed)
Technically successful fluoro guided LP at L4-5 level with opening pressure of 12 cm H2O ?11 cc of clear, colorless CSF sent to lab for analysis. ? ?No immediate post procedural complication. ? ?Please see imaging section of Epic for full dictation. ? ?Candiss Norse, PA-C ? ?

## 2022-04-13 NOTE — Progress Notes (Signed)
Neurology Progress Note ? ?Patient ID: Kelsey Rodriguez is a 31 y.o. with PMHx of  ? ?Major interval events/Subjective: ?- No further sz activity ?- No acute complaints including no headache ?- General skin soreness she attributes to seizure ?- Failed bedside LP yesterday ? ?Exam: ?Vitals:  ? 04/13/22 0315 04/13/22 0838  ?BP: (!) 88/65 115/76  ?Pulse: 92 88  ?Resp: 17 18  ?Temp: 97.9 ?F (36.6 ?C) 98.1 ?F (36.7 ?C)  ?SpO2: 100%   ? ?Gen: In bed, comfortable  ?Resp: non-labored breathing, no grossly audible wheezing ?Cardiac: Perfusing extremities well  ?Abd: soft, nt ? ?Neuro: ?MS: Alert awake, appropriate, interactive, follows commands, fully oriented  ?CN: VFF, PERRL, EMOI, face symmetric, tongue midline, shoulder shrug symmetric  ?Motor: No pronator drift, able to stand on heels and toes  ? ?Pertinent Labs: ?HIV VL 208, Quant Log 2.32 ?RPR titer 1:2, stable from prior likely c/w treated infection ?Crypto Ag neg blood ?Repeat CD4 count still pending ? ? 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 ?Workup so far with CD count is pending but in the past, her CD4 count has ranged from 51 - 252. MRI completed and demonstrated increase in the size of previously seen ring enhancing lesions which at that time were felt to be most consistent with CNS toxoplasmosis based on imaging and brain biopsy results. She also seems to have increase in the vasogenic edema associated with these lesions. Our suspicion remains that her current presentation is likely due to reactivation of CNS toxoplasmosis vs IRIS specially since her CD4 count seemed to have risen a bit most  recently. ? ?Recommendations: ?- continue bactrim IV ?- continue Keppra 500 mg BID  ?- IR guided LP ? ?Lesleigh Noe MD-PhD ?[facility] ?[phone]  ?Available 7 AM to 7 PM, outside these hours please contact Neurologist on call listed on AMION  ? ?Greater than 25 minutes in care today, > 50% at bedside ?

## 2022-04-14 DIAGNOSIS — R569 Unspecified convulsions: Secondary | ICD-10-CM | POA: Diagnosis not present

## 2022-04-14 DIAGNOSIS — B20 Human immunodeficiency virus [HIV] disease: Secondary | ICD-10-CM | POA: Diagnosis not present

## 2022-04-14 DIAGNOSIS — B589 Toxoplasmosis, unspecified: Secondary | ICD-10-CM | POA: Diagnosis not present

## 2022-04-14 LAB — CBC
HCT: 33.2 % — ABNORMAL LOW (ref 36.0–46.0)
Hemoglobin: 11.6 g/dL — ABNORMAL LOW (ref 12.0–15.0)
MCH: 31.7 pg (ref 26.0–34.0)
MCHC: 34.9 g/dL (ref 30.0–36.0)
MCV: 90.7 fL (ref 80.0–100.0)
Platelets: 190 K/uL (ref 150–400)
RBC: 3.66 MIL/uL — ABNORMAL LOW (ref 3.87–5.11)
RDW: 15.6 % — ABNORMAL HIGH (ref 11.5–15.5)
WBC: 5.8 K/uL (ref 4.0–10.5)
nRBC: 0 % (ref 0.0–0.2)

## 2022-04-14 LAB — TROPONIN I (HIGH SENSITIVITY)
Troponin I (High Sensitivity): 12 ng/L
Troponin I (High Sensitivity): 10 ng/L

## 2022-04-14 LAB — T.PALLIDUM AB, TOTAL: T Pallidum Abs: NONREACTIVE

## 2022-04-14 LAB — CYTOLOGY - NON PAP

## 2022-04-14 LAB — T-HELPER CELLS (CD4) COUNT (NOT AT ARMC)

## 2022-04-14 NOTE — Progress Notes (Addendum)
Neurology Progress Note ? ? ?S:// ?Patient is awake and alert in NAD. She has a small headache this morning. She wants to go. No new neurological events overnight  ? ? ?O:// ?Current vital signs: ?BP 104/65 (BP Location: Left Arm)   Pulse 88   Temp 98.3 ?F (36.8 ?C) (Oral)   Resp 20   Ht '5\' 4"'$  (1.626 m)   Wt 83.6 kg   LMP 05/28/2021 (Approximate)   SpO2 100%   BMI 31.64 kg/m?  ?Vital signs in last 24 hours: ?Temp:  [98 ?F (36.7 ?C)-98.6 ?F (37 ?C)] 98.3 ?F (36.8 ?C) (05/01 0813) ?Pulse Rate:  [88-93] 88 (05/01 0813) ?Resp:  [15-20] 20 (05/01 0813) ?BP: (95-120)/(57-79) 104/65 (05/01 0813) ?SpO2:  [100 %] 100 % (05/01 0813) ? ?GENERAL: Awake, alert in NAD ?HEENT: - Normocephalic and atraumatic, dry mm ?LUNGS - Clear to auscultation bilaterally with no wheezes ?CV - S1S2 RRR, no m/r/g, equal pulses bilaterally. ?ABDOMEN - Soft, nontender, nondistended with normoactive BS ?Ext: warm, well perfused, intact peripheral pulses, no edema ? ?NEURO:  ?Mental Status: AA&Ox3  ?Language: speech is clear.  Naming, repetition, fluency, and comprehension intact. ?Cranial Nerves: PERRL 2 mm/brisk. EOMI, visual fields full, no facial asymmetry, facial sensation intact, hearing intact, tongue/uvula/soft palate midline, normal sternocleidomastoid and trapezius muscle strength. No evidence of tongue atrophy or fibrillations ?Motor: 5/5 in all 4 extremities  ?Tone: is normal and bulk is normal ?Sensation- Intact to light touch bilaterally ?Coordination: FTN intact bilaterally, no ataxia in BLE. ?Gait- deferred ? ?Medications ? ?Current Facility-Administered Medications:  ?  0.9 %  sodium chloride infusion, , Intravenous, PRN, Hensel, Jamal Collin, MD, Stopped at 04/12/22 1322 ?  acetaminophen (TYLENOL) tablet 650 mg, 650 mg, Oral, Q6H PRN **OR** acetaminophen (TYLENOL) suppository 650 mg, 650 mg, Rectal, Q6H PRN, France Ravens, MD ?  bictegravir-emtricitabine-tenofovir AF (BIKTARVY) 50-200-25 MG per tablet 1 tablet, 1 tablet, Oral,  Daily, France Ravens, MD, 1 tablet at 04/13/22 [phone]  Chlorhexidine Gluconate Cloth 2 % PADS 6 each, 6 each, Topical, Daily, Hensel, Jamal Collin, MD, 6 each at 04/13/22 1000 ?  docusate sodium (COLACE) capsule 100 mg, 100 mg, Oral, BID, France Ravens, MD, 100 mg at 04/13/22 2217 ?  levETIRAcetam (KEPPRA) tablet 500 mg, 500 mg, Oral, BID, France Ravens, MD, 500 mg at 04/13/22 2217 ?  lip balm (CARMEX) ointment, , Topical, PRN, Hensel, Jamal Collin, MD ?  LORazepam (ATIVAN) injection 4 mg, 4 mg, Intravenous, PRN, France Ravens, MD ?  polyethylene glycol (MIRALAX / GLYCOLAX) packet 17 g, 17 g, Oral, Daily, France Ravens, MD, 17 g at 04/13/22 4580 ?  senna (SENOKOT) tablet 8.6 mg, 1 tablet, Oral, BID, France Ravens, MD, 8.6 mg at 04/13/22 2217 ?  sodium chloride flush (NS) 0.9 % injection 10-40 mL, 10-40 mL, Intracatheter, Q12H, Hensel, Jamal Collin, MD, 10 mL at 04/13/22 2219 ?  sodium chloride flush (NS) 0.9 % injection 10-40 mL, 10-40 mL, Intracatheter, PRN, Hensel, Jamal Collin, MD ?  sulfamethoxazole-trimethoprim (BACTRIM) 415.04 mg in dextrose 5 % 500 mL IVPB, 10 mg/kg/day, Intravenous, Q12H, Rosiland Oz, MD, Last Rate: 350.6 mL/hr at 04/13/22 2325, 415.04 mg at 04/13/22 2325 ? ?Imaging ?I have reviewed images in epic and the results pertinent to this consultation are: ? ?CT-scan of the brain 4/28: ?No acute intracranial abnormality. ? ?MRI examination of the brain 4/28: ?Interval increase in size of pre-existing ring-enhancing lesions in the left frontal lobe with increase the surrounding vasogenic edema in a patient with previous diagnosis  of toxoplasmosis. This may be seen in the setting of drug resistance versus immune reconstitution. ? ?CSF Labs: ?CSF glucose 54 ?CSF protein 20 ?VDRL pending  ?CSF cell count RBC 3, WBC 4  ?CSF culture NGTD  ?Cryptococcal negative  ?CSF fungal culture pending  ?JC/BK pending  ?CMV pending  ?Acid fast smear/Acid fast culture pending  ?CSF anaerobic culture pending  ?Toxoplasma pending  ?EBV pending   ? 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 ? ?Generalized tonic clonic seizure likely related to (high suspicion for) active toxoplasmosis CNS infection vs IRIS  ? ?Recommendations: ?- Continue Keppra '500mg'$  BID  ?- continue Antibiotics per ID  ?- follow CSF studies  ?- neurology will continue to follow along  ? ?Beulah Gandy DNP, ACNPC-AG ? ?Have seen the patient and reviewed the above note.  She has cortical-based enhancing lesions, infection versus reconstitution syndrome.  ? ?I will defer to infectious disease on need for antibiotics in the treatment of these, but what ever the etiology, certainly these could be a focus for seizure activity. ? ?She will need to be on antiepileptic therapy, likely indefinite and I think Keppra would be a good choice for her. ? ?At this time, would favor treating with AED, agree with treatment for toxoplasmosis with short interval re-imaging. She has seen [facility] in the past and can follow up there again.  ? ?Please call if neurology can be of further assistance.  ? ?Roland Rack, MD ?[facility] ?[phone] ? ?If 7pm- 7am, please page neurology on call as listed in [place]. ? ? ? ?  ?

## 2022-04-14 NOTE — Progress Notes (Signed)
Family Medicine Teaching Service ?Daily Progress Note ?Intern Pager: [phone] ? ?Patient name: Kelsey Rodriguez Medical record number: [id number] ?Date of birth: 11-Oct-1991 Age: 31 y.o. Gender: female ? ?Primary Care Provider: Gerlene Fee, DO ?Consultants: ID, neurology ?Code Status: Full ? ?Pt Overview and Major Events to Date:  ?4/28 admitted with seizure, RI abnormal on neuro and ID consulted ?4/30 IR guided LP ? ?Assessment and Plan: ?Kelsey Rodriguez is a 31 year old female presenting to 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 ?Generalized tonic-clonic seizure history ?CSF fungus, acid-fast, VDRL, JC virus, CMV, toxoplasma cultures all pending.  Toxo reactivation (MRI of brain worsening ring-enhancing lesions) versus IRIS possibility.  No seizure-like activity overnight. ?-Neurology and ID following, appreciate recs and care ?-IV Bactrim, PICC in place Discussed with ID that they would like atleast 2 weeks of Abx and MRI after to see progression. They would like SNF for continuation of treatment instead of home health outpatient. Will discuss with patient about this today ?-Keppra 500 mg twice daily p.o. ?-Await LP cultures ?-Seizure precautions ?-Up with assistance ?-Ativan as needed for seizure greater than 5 minutes ? ?HIV/AIDS  medical medication noncompliance  history of CNS toxoplasmosis ?Concern for reactivation of toxoplasmosis versus IRIS.  CD4 count 353. ?-ID following, appreciate care and recommendations ?-Biktarvy ?-IV Bactrim ?-Await LP cultures ?-Repeat MRI with and without contrast in 2 weeks to monitor ring-enhancing lesions ? ?Palpitations  Reproducible chest pain, resolved ?EKG was NSR yesterday, Troponins trended  flat. ?-Monitor ? ?FEN/GI: Regular ?PPx: SCDs ?Dispo: Pending discussion about SNF  ? ?Subjective:  ?No issues overnight, no pain. Says intermittently has headache and nausea but no vomiting or vision changes ? ?Objective: ?Temp:  [98 ?F (36.7 ?C)-98.8 ?F (37.1 ?C)] 98.8 ?F (37.1 ?C) (05/01 1228) ?Pulse Rate:  [84-93] 84 (05/01 1228) ?Resp:  [15-20] 17 (05/01 1228) ?BP: (95-120)/(57-79) 100/70 (05/01 1228) ?SpO2:  [100 %] 100 % (05/01 1228) ?Physical Exam: ?General: NAD, sitting up in bed comfortably ?Cardiovascular: RRR no m/r/g ?Respiratory: CTAB no w/r/c ?Abdomen: Nontender, soft ?Extremities: No lower extremity edema ? ?Laboratory: ?Recent Labs  ?Lab 04/11/22 ?8182 04/12/22 ?0128 04/14/22 ?9937  ?WBC 7.2 5.2 5.8  ?HGB 10.8* 11.5* 11.6*  ?HCT 32.2* 35.5* 33.2*  ?PLT 222 201 190  ? ?Recent Labs  ?Lab 04/09/22 ?0950 04/11/22 ?0333 04/12/22 ?0128 04/13/22 ?1696  ?NA  --  137 139 135  ?K  --  4.0 4.2 4.4  ?CL  --  108 111 106  ?CO2  --  '23 23 22  '$ ?BUN  --  '14 9 9  '$ ?CREATININE  --  1.00 0.83 0.77  ?CALCIUM  --  9.1 8.7* 9.0  ?PROT 8.0 7.9  --   --   ?BILITOT 0.3 0.3  --   --   ?ALKPHOS  --  70  --   --   ?ALT 15 18  --   --   ?AST 15 24  --   --   ?GLUCOSE  --  81 81 90  ? ? ?Imaging/Diagnostic Tests: ? ? ?Gerrit Heck, MD ?04/14/2022, 4:11 PM ?PGY-1, [place] ?[place] Intern pager: [phone], text pages welcome  ?

## 2022-04-14 NOTE — Progress Notes (Addendum)
Family Medicine Teaching Service ?Daily Progress Note ?Intern Pager: [phone] ? ?Patient name: Kelsey Rodriguez Medical record number: [id number] ?Date of birth: September 11, 1991 Age: 31 y.o. Gender: female ? ?Primary Care Provider: Gerlene Fee, DO ?Consultants: ID, neurology ?Code Status: Full ? ?Pt Overview and Major Events to Date:  ?4/28 admitted with seizure, MRI abnormal and neuro and ID consulted ?4/20 IR guided LP  ? ?Assessment and Plan: ? ?Kelsey Rodriguez is a 31 yo female presenting to [facility] after generalized tonic-clonic seizure, PMH significant for HIV/AIDS, b/l hip avascular necrosis s/p hi replacements, CNS toxoplasmosis s/p R stereoteactic temporal craniectomy with tumor resection in setting of medication noncompliance, and tubo-ovarian abscess.  ? ?Generalized Tonic-Clonic Seizure ?LP performed yeseterday by IR with cytology overall normal. CSF culture negative, cryptococcal Ag negative. Awaiting other fungal/viral cultures. Toxo reactivation (MRI with worsening ring enhancing lesions) vs IRIS possibility. No seizure like activity overnight. ?-Neurology and ID following, appreciate recs and care ?-IV Bactrim (may need PICC at d/c) ?-Keppra 500 mg BID ?-Await LP viral/fungal cultures ?-Seizure precautions ?-Up with assistance ?-Ativan prn for seizure >5 minutes ? ?Palpitations ?Has felt some intermittent palpitations and some chest pain on her left side that is reproducible on examination.  Nonradiating chest pain.  Denies any dyspnea.  Says that her husband had attempted CPR on her when she had her seizure.  EKG showed normal sinus rhythm without any acute ST changes. Troponin 12. ?-Monitor ? ?HIV/AIDs  Medication noncompliance  Hx of CNS Toxoplasmosis ?Concern for recativation of toxoplasmosis vs IRIS. CD4 353. ?-ID following, appreciate care/recs ?-Biktarvy ?-IV Bactrim ?-Await LP viral/fungal cultures ?-PICC likely needed ?-Repeat MRI w/wo in 2 weeks to monitor ring enhancing  lesions ? ?FEN/GI: Regular ?PPx: SCDs ?Dispo: Home pending LP cx results ? ?Subjective:  ?Says that she has been feeling some palpitations intermittently.  Says that she has palpable chest pain on her left side that is nonradiating.  Denies any shortness of breath. ? ?Objective: ?Temp:  [98 ?F (36.7 ?C)-98.6 ?F (37 ?C)] 98.6 ?F (37 ?C) (05/01 0336) ?Pulse Rate:  [88-93] 90 (05/01 0336) ?Resp:  [15-18] 17 (05/01 0336) ?BP: (95-120)/(57-79) 95/57 (05/01 0336) ?SpO2:  [100 %] 100 % (05/01 0336) ?Physical Exam: ?General: NAD, laying in bed comfortably, alert and responsive ?Cardiovascular: RRR no murmurs rubs or gallops ?Respiratory: Clear to auscultation bilaterally no wheezes rales or crackles ?Abdomen: Nontender, soft, nondistended ?Extremities: No lower extremity edema ? ?Laboratory: ?Recent Labs  ?Lab 04/11/22 ?9450 04/12/22 ?0128 04/14/22 ?3888  ?WBC 7.2 5.2 5.8  ?HGB 10.8* 11.5* 11.6*  ?HCT 32.2* 35.5* 33.2*  ?PLT 222 201 190  ? ?Recent Labs  ?Lab 04/09/22 ?0950 04/11/22 ?0333 04/12/22 ?0128 04/13/22 ?2800  ?NA  --  137 139 135  ?K  --  4.0 4.2 4.4  ?CL  --  108 111 106  ?CO2  --  '23 23 22  '$ ?BUN  --  '14 9 9  '$ ?CREATININE  --  1.00 0.83 0.77  ?CALCIUM  --  9.1 8.7* 9.0  ?PROT 8.0 7.9  --   --   ?BILITOT 0.3 0.3  --   --   ?ALKPHOS  --  70  --   --   ?ALT 15 18  --   --   ?AST 15 24  --   --   ?GLUCOSE  --  81 81 90  ? ? ? ?Imaging/Diagnostic Tests: ? ? ?Gerrit Heck, MD ?04/14/2022, 5:17 AM ?PGY-1, [facility] ?[place] Intern pager:  [phone], text pages welcome  ?

## 2022-04-14 NOTE — Plan of Care (Signed)

## 2022-04-14 NOTE — Progress Notes (Addendum)
? ?[facility] Infectious Diseases Follow Up Note ? ?Patient Identification: ?Patient Name: Kelsey Rodriguez MRN: [id number] [place] Date: 04/11/2022  2:13 AM ?Age: 31 y.o.Today's Date: 04/14/2022 ? ? ?Reason for Visit: CNS toxo vs IRIS vs others  ? ?Principal Problem: ?  Seizure ([place]) ?Active Problems: ?  Toxoplasmosis ? ? ?Antibiotics: ?IV bactrim 4/28-c ? ?On [place] ? ?Lines/Hardware: rt and lt THA ? ?Interval Events: remains afebrile, S/p LP yesterday  ? 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 Neurology following. EEG negative for seizures. S/p LP on 4/30. On Keppra.  ?HIV with h/o poor compliance  ?Medication monitoring  ? ?Recommendations ?Continue IV bactrim as is ?Monitor Cr and K ?Continue Biktarvy  ?Fu labs sent from CSF  ?Discussed with Neurology and do not strongly consider steroids currently as patient clinically doing well with improved headache.  ?She seems to have poor compliance with medication intake overall and seems to be reluctant taking multiple pills again. I would favor treatment with IV bactrim in a supervised setting than PO if work up  favoring more towards recurrence of CNS toxo vs others.  ? ?Rest of the management as per the primary team. ?Thank you for the consult. Please page with pertinent questions or concerns. ? ?______________________________________________________________________ ?Subjective ?patient seen and examined at the bedside.  ?Headache has been improved. Denies any blurry vision and hearing changes  ?Reports missing 2-3 pills of biktarvy as well as 2-3 pills of atovaquone in a week ? ?Vitals ?BP 104/65 (BP Location: Left Arm)   Pulse 88   Temp 98.3 ?F (36.8 ?C) (Oral)   Resp 20   Ht '5\' 4"'$  (1.626 m)   Wt 83.6 kg   LMP 05/28/2021 (Approximate)    SpO2 100%   BMI 31.64 kg/m?  ? ?  ?Physical Exam ?Constitutional:  lying in the bed and appears energetic compared to last Friday ?   Comments:  ? ?Cardiovascular:  ?   Rate and Rhythm: Normal rate and regular rhythm.  ?   Heart sounds:  ? ?Pulmonary:  ?   Effort: Pulmonary effort is normal.  ?   Comments:  ? ?Abdominal:  ?   Palpations: Abdomen is soft.  ?   Tenderness: non tender  ? ?Musculoskeletal:     ?   General: No swelling or tenderness.  ? ?Skin: ?   Comments: No cervical and axillary lymphadenopathy  ? ?Neurological:  ?   General: No focal deficit present.  ? ?Psychiatric:     ?   Mood and Affect: Mood normal.  ? ? ? ?Pertinent Microbiology ?Results for orders placed or performed during the hospital encounter of 04/11/22  ?Urine Culture     Status: Abnormal  ? Collection Time: 04/11/22  2:53 AM  ? Specimen: In/Out Cath Urine  ?Result Value Ref Range Status  ? Specimen Description IN/OUT CATH URINE  Final  ? Special Requests   Final  ?  NONE ?Performed at [facility], [place] [address] [place], [place] [address] ?  ? Culture MULTIPLE SPECIES PRESENT, SUGGEST RECOLLECTION (A)  Final  ? Report Status 04/12/2022 FINAL  Final  ?Blood Culture (routine x 2)     Status: None (Preliminary result)  ? Collection Time: 04/11/22  3:13 AM  ? Specimen: BLOOD  ?Result Value Ref Range Status  ? Specimen Description BLOOD RIGHT ANTECUBITAL  Final  ? Special Requests   Final  ?  BOTTLES DRAWN AEROBIC AND ANAEROBIC Blood Culture adequate volume  ? Culture   Final  ?  NO GROWTH 3 DAYS ?Performed at [facility], [place] [address], [place], [place] [address] ?  ? Report Status PENDING  Incomplete  ?Blood Culture (routine x 2)     Status: None (Preliminary result)  ? Collection Time: 04/11/22  3:35 AM  ? Specimen: BLOOD LEFT FOREARM  ?Result Value Ref Range Status  ? Specimen Description BLOOD LEFT FOREARM  Final  ? Special Requests   Final  ?  BOTTLES DRAWN AEROBIC AND ANAEROBIC Blood Culture adequate volume  ? Culture    Final  ?  NO GROWTH 3 DAYS ?Performed at [facility], [place] [address], [place], [place] [address] ?  ? Report Status PENDING  Incomplete  ?CSF culture w Gram Stain     Status: None (Preliminary result)  ? Collection Time: 04/13/22 11:50 AM  ? Specimen: PATH Cytology CSF; Cerebrospinal Fluid  ?Result Value Ref Range Status  ? Specimen Description CSF  Final  ? Special Requests NONE  Final  ? Gram Stain   Final  ?  WBC PRESENT, PREDOMINANTLY MONONUCLEAR ?NO ORGANISMS SEEN ?CYTOSPIN SMEAR ?  ? Culture   Final  ?  NO GROWTH < 24 HOURS ?Performed at [facility], [place] [address] [place], [place] [address] ?  ? Report Status PENDING  Incomplete  ?Anaerobic culture w Gram Stain     Status: None (Preliminary result)  ? Collection Time: 04/13/22 11:50 AM  ? Specimen: PATH Cytology CSF; Cerebrospinal Fluid  ?Result Value Ref Range Status  ? Specimen Description CSF  Final  ? Special Requests NONE  Final  ? Gram Stain   Final  ?  WBC PRESENT, PREDOMINANTLY MONONUCLEAR ?NO ORGANISMS SEEN ?CYTOSPIN SMEAR ?Performed at [facility], [place] [address], [place], [place] [address] ?  ? Culture PENDING  Incomplete  ? Report Status PENDING  Incomplete  ? ?*Note: Due to a large number of results and/or encounters for the requested time period, some results have not been displayed. A complete set of results can be found in Results Review.  ? ? ?Pertinent Lab. ? ?  Latest Ref Rng & Units 04/14/2022  ?  3:57 AM 04/12/2022  ?  1:28 AM 04/11/2022  ?  6:24 AM  ?CBC  ?WBC 4.0 - 10.5 K/uL 5.8   5.2   7.2    ?Hemoglobin 12.0 - 15.0 g/dL 11.6   11.5   10.8    ?Hematocrit 36.0 - 46.0 % 33.2   35.5   32.2    ?Platelets 150 - 400 K/uL 190   201   222    ? ? ?  Latest Ref Rng & Units 04/13/2022  ?  3:17 AM 04/12/2022  ?  1:28 AM 04/11/2022  ?  3:33 AM  ?CMP  ?Glucose 70 - 99 mg/dL 90   81   81    ?BUN 6 - 20 mg/dL '9   9   14    '$ ?Creatinine 0.44 - 1.00 mg/dL 0.77   0.83   1.00    ?Sodium 135 - 145 mmol/L 135   139   137    ?Potassium 3.5 -  5.1 mmol/L 4.4   4.2   4.0    ?Chloride 98 - 111 mmol/L 106   111   108    ?CO2  22 - 32 mmol/L '22   23   23    '$ ?Calcium 8.9 - 10.3 mg/dL 9.0   8.7   9.1    ?Total Protein 6.5 - 8.1 g/dL   7.9    ?Total Bilirubin 0.3 - 1.2 mg/dL   0.3    ?Alkaline Phos 38 - 126 U/L   70    ?AST 15 - 41 U/L   24    ?ALT 0 - 44 U/L   18    ? ? ? ?Pertinent Imaging today ?Plain films and CT images have been personally visualized and interpreted; radiology reports have been reviewed. Decision making incorporated into the Impression / Recommendations. ? ?[place] EKG SITE RITE ? ?Result Date: 04/12/2022 ?If [employer] not attached, placement could not be confirmed due to current cardiac rhythm. ? ?DG FL GUIDED LUMBAR PUNCTURE ? 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 Lumbar puncture attempted on the floor without success. Request for image guided lumbar puncture due to concern for active CNS toxoplasmosis encephalitis. EXAM: DIAGNOSTIC LUMBAR PUNCTURE UNDER FLUOROSCOPIC GUIDANCE COMPARISON:  None FLUOROSCOPY: Radiation Exposure Index (as provided by the fluoroscopic device): 13.7 mGy Kerma PROCEDURE: Informed consent was obtained from the patient prior to the procedure, including potential complications of headache, allergy, and pain. With the patient prone, the lower back was prepped with Betadine. 1% Lidocaine was used for local anesthesia. Lumbar puncture was performed at the L4-5 level using a 20 gauge 3 1/2 inch needle with return of clear, colorless CSF with an opening pressure of 12 cm water. 11 ml of CSF were obtained for laboratory studies. The patient tolerated the procedure well and there were no apparent complications. IMPRESSION: Successful image guided lumbar puncture at L4-5 level Performed by Candiss Norse, PA-C  Electronically Signed   By: Kerby Moors M.D.   On: 04/13/2022 12:30   ? ? ?I spent 50 minutes for this patient encounter including review of prior medical records, coordination of care with primary/other

## 2022-04-15 DIAGNOSIS — B589 Toxoplasmosis, unspecified: Secondary | ICD-10-CM | POA: Diagnosis not present

## 2022-04-15 DIAGNOSIS — B20 Human immunodeficiency virus [HIV] disease: Secondary | ICD-10-CM | POA: Diagnosis not present

## 2022-04-15 DIAGNOSIS — R569 Unspecified convulsions: Secondary | ICD-10-CM | POA: Diagnosis not present

## 2022-04-15 LAB — CBC
HCT: 34.1 % — ABNORMAL LOW (ref 36.0–46.0)
Hemoglobin: 11.3 g/dL — ABNORMAL LOW (ref 12.0–15.0)
MCH: 30.3 pg (ref 26.0–34.0)
MCHC: 33.1 g/dL (ref 30.0–36.0)
MCV: 91.4 fL (ref 80.0–100.0)
Platelets: 188 10*3/uL (ref 150–400)
RBC: 3.73 MIL/uL — ABNORMAL LOW (ref 3.87–5.11)
RDW: 15.3 % (ref 11.5–15.5)
WBC: 5.3 10*3/uL (ref 4.0–10.5)
nRBC: 0 % (ref 0.0–0.2)

## 2022-04-15 LAB — BASIC METABOLIC PANEL
Anion gap: 7 (ref 5–15)
BUN: 8 mg/dL (ref 6–20)
CO2: 22 mmol/L (ref 22–32)
Calcium: 9.4 mg/dL (ref 8.9–10.3)
Chloride: 103 mmol/L (ref 98–111)
Creatinine, Ser: 0.98 mg/dL (ref 0.44–1.00)
GFR, Estimated: >60 mL/min (ref >60)
Glucose, Bld: 92 mg/dL (ref 70–99)
Potassium: 4.2 mmol/L (ref 3.5–5.1)
Sodium: 132 mmol/L — ABNORMAL LOW (ref 135–145)

## 2022-04-15 LAB — BK QUANT PCR (PLASMA/SERUM): BK Quantitaion PCR: NEGATIVE IU/mL

## 2022-04-15 LAB — VDRL, CSF: VDRL Quant, CSF: NONREACTIVE

## 2022-04-15 NOTE — Care Management Important Message (Signed)
Important Message ? ?Patient Details  ?Name: Kelsey Rodriguez ?MRN: [id number] ?Date of Birth: 07-20-1991 ? ? ?Medicare Important Message Given:  Yes ? ? ? ? ?Navina Wohlers ?04/15/2022, 1:28 PM ?

## 2022-04-15 NOTE — Progress Notes (Signed)
Discussed with patient ID recommendation of getting IV bactrim in a supervised setting like a SNF. Discussed the home health also an option but not recommended as much by the specialists given her history of medication noncompliance and less monitoring. Patient says she is not interested in going to SNF currently. She iterated staying in hospital versus home health. Discussed that SNF option given patient otherwise medically stable in the hospital and that these facilities have monitoring. Patient says that she will think about it and discuss with her husband as well. Will follow up decision likely tomorrow AM what option she decides.  ?

## 2022-04-15 NOTE — Discharge Instructions (Addendum)
Dear Kelsey Rodriguez, ? ?Thank you for letting us participate in your care. You were hospitalized for seizure-like activity.  ? ?POST-HOSPITAL & CARE INSTRUCTIONS ?Take your IV antibiotics medication every day. We will send you home with home health to assist with the IV medications.  ?Call and make a follow up appointment with the neurologist ?Call and make a follow up appointment with the infectious disease doctor ?Go to your follow up appointments (listed below) ? ? ?DOCTOR'S APPOINTMENT   ?Future Appointments  ?Date Time Provider [place]  ?04/18/2022  2:30 PM Autry-Lott, Naaman Plummer, DO [facility] [place]  ?05/02/2022  2:30 PM Gerlene Fee, DO [facility] [place]  ?05/05/2022  1:45 PM Magnus Sinning, MD [facility] None  ?05/07/2022  9:00 AM [facility]  ?08/04/2022 10:30 AM Carlyle Basques, MD [facility]  ? ? Follow-up Information   ? ? Autry-Lott, Simone, DO. Go on 04/18/2022.   ?Specialty: Family Medicine ?Why: At 2:30pm. Please arrive at 2:15pm. This is your hospital follow up with your primary care doctor. ?Contact information: ?[address] [place] [address] ?[phone] ? ? ?  ?  ? ? Carlyle Basques, MD. Schedule an appointment as soon as possible for a visit in 1 week(s).   ?Specialty: Infectious Diseases ?Why: Make a follow up appointment with the infectious diseases doctor. ?Contact information: ?[address] ?[address] ?[place] [address] ?[phone] ? ? ?  ?  ? ? Autry-Lott, Simone, DO. Go on 05/02/2022.   ?Specialty: Family Medicine ?Why: At 2:30pm. Please arrive by 2:15pm. This is a second follow up with your PCP to ensure you are connected with neurology and infectious diseases, as well as doing great on your home antibiotics. ?Contact information: ?[address] [place] [address] ?[phone] ? ? ?  ?  ? ?  ?  ? ?  ? ? ?Take care and be well! ? ?Family Medicine Teaching Service Inpatient Team ?[place]  ?[facility]  ?[address] [place], [place] [address] ?[phone] ?

## 2022-04-15 NOTE — Progress Notes (Signed)
? ?[facility] Infectious Diseases Follow Up Note ? ?Patient Identification: ?Patient Name: Jeanean Hollett MRN: [id number] [place] Date: 04/11/2022  2:13 AM ?Age: 31 y.o.Today's Date: 04/15/2022 ? ? ?Reason for Visit: CNS toxo vs IRIS vs others  ? ?Principal Problem: ?  Seizure ([place]) ?Active Problems: ?  Toxoplasmosis ? ? ?Antibiotics: ?IV bactrim 4/28-c ? ?On [place] ? ?Lines/Hardware: rt and lt THA ? ?Interval Events: remains afebrile ? 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 Neurology following. EEG negative for seizures. On Keppra.  ?-     s/p LP on 4/30 ( WBC 4, glucose and protein WNL), cultures with negative gram stain, Crypto ag negative, Cytology negative for malignant cells, mixed acute and chronic inflammation) ?-       4/29 crypto ag negative, RPR negative ? ?HIV with h/o poor compliance  ?Medication monitoring  ? ?Recommendations ?Continue IV bactrim as is. Monitor Cr and K ?Continue Biktarvy  ?Fu pending labs sent from CSF  ?Will need repeat MRI brain in approx 2 weeks to monitor response of tx for possible CNS toxoplasmosis. Will need to consider alternative causes if no improvement ?I am sceptical about her compliance with medications if discharged home and would favor supervised setting for treatment adherence. SNF can be considered  ?Following ? ?Rest of the management as per the primary team. ?Thank you for the consult. Please page with pertinent questions or concerns. ? ?______________________________________________________________________ ?Subjective ?patient seen and examined at the bedside.  ?Headache has been improved, only has pressure sensation. Denies any blurry vision and hearing changes  ? ?Vitals ?BP 118/81 (BP Location: Left Arm)   Pulse 86   Temp 98.5 ?F (36.9  ?C) (Oral)   Resp 17   Ht '5\' 4"'$  (1.626 m)   Wt 83.6 kg   LMP 05/28/2021 (Approximate)   SpO2 100%   BMI 31.64 kg/m?  ? ?  ?Physical Exam ?Constitutional:  lying in the bed, comfortable ?   Comments:  ? ?Cardiovascular:  ?   Rate and Rhythm: Normal rate and regular rhythm.  ?   Heart sounds:  ? ?Pulmonary:  ?   Effort: Pulmonary effort is normal.  ?   Comments:  ? ?Abdominal:  ?   Palpations: Abdomen is soft.  ?   Tenderness: non tender  ? ?Musculoskeletal:     ?   General: No swelling or tenderness.  ? ?Skin: ?   Comments: No cervical and axillary lymphadenopathy  ? ?Neurological:  ?   General: No focal deficit present.  ? ?Psychiatric:     ?   Mood and Affect: Mood normal.  ? ? ? ?Pertinent Microbiology ?Results for orders placed or performed during the hospital encounter of 04/11/22  ?Urine Culture     Status: Abnormal  ? Collection Time: 04/11/22  2:53 AM  ? Specimen: In/Out Cath Urine  ?Result Value Ref Range Status  ? Specimen Description IN/OUT CATH URINE  Final  ? Special Requests   Final  ?  NONE ?Performed at [facility], [place] [address] [place], [place] [address] ?  ? Culture MULTIPLE SPECIES PRESENT, SUGGEST RECOLLECTION (A)  Final  ? Report Status 04/12/2022 FINAL  Final  ?Blood Culture (routine x 2)     Status: None (Preliminary result)  ? Collection Time: 04/11/22  3:13 AM  ? Specimen: BLOOD  ?Result Value  Ref Range Status  ? Specimen Description BLOOD RIGHT ANTECUBITAL  Final  ? Special Requests   Final  ?  BOTTLES DRAWN AEROBIC AND ANAEROBIC Blood Culture adequate volume  ? Culture   Final  ?  NO GROWTH 3 DAYS ?Performed at [facility], [place] [address] [place], [place] [address] ?  ? Report Status PENDING  Incomplete  ?Blood Culture (routine x 2)     Status: None (Preliminary result)  ? Collection Time: 04/11/22  3:35 AM  ? Specimen: BLOOD LEFT FOREARM  ?Result Value Ref Range Status  ? Specimen Description BLOOD LEFT FOREARM  Final  ? Special Requests   Final  ?  BOTTLES DRAWN  AEROBIC AND ANAEROBIC Blood Culture adequate volume  ? Culture   Final  ?  NO GROWTH 3 DAYS ?Performed at [facility], [place] [address], [place], [place] [address] ?  ? Report Status PENDING  Incomplete  ?CSF culture w Gram Stain     Status: None (Preliminary result)  ? Collection Time: 04/13/22 11:50 AM  ? Specimen: PATH Cytology CSF; Cerebrospinal Fluid  ?Result Value Ref Range Status  ? Specimen Description CSF  Final  ? Special Requests NONE  Final  ? Gram Stain   Final  ?  WBC PRESENT, PREDOMINANTLY MONONUCLEAR ?NO ORGANISMS SEEN ?CYTOSPIN SMEAR ?  ? Culture   Final  ?  NO GROWTH < 24 HOURS ?Performed at [facility], [place] [address] [place], [place] [address] ?  ? Report Status PENDING  Incomplete  ?Anaerobic culture w Gram Stain     Status: None (Preliminary result)  ? Collection Time: 04/13/22 11:50 AM  ? Specimen: PATH Cytology CSF; Cerebrospinal Fluid  ?Result Value Ref Range Status  ? Specimen Description CSF  Final  ? Special Requests NONE  Final  ? Gram Stain   Final  ?  WBC PRESENT, PREDOMINANTLY MONONUCLEAR ?NO ORGANISMS SEEN ?CYTOSPIN SMEAR ?Performed at [facility], [place] [address], [place], [place] [address] ?  ? Culture PENDING  Incomplete  ? Report Status PENDING  Incomplete  ? ?*Note: Due to a large number of results and/or encounters for the requested time period, some results have not been displayed. A complete set of results can be found in Results Review.  ? ? ?Pertinent Lab. ? ?  Latest Ref Rng & Units 04/15/2022  ?  4:25 AM 04/14/2022  ?  3:57 AM 04/12/2022  ?  1:28 AM  ?CBC  ?WBC 4.0 - 10.5 K/uL 5.3   5.8   5.2    ?Hemoglobin 12.0 - 15.0 g/dL 11.3   11.6   11.5    ?Hematocrit 36.0 - 46.0 % 34.1   33.2   35.5    ?Platelets 150 - 400 K/uL 188   190   201    ? ? ?  Latest Ref Rng & Units 04/15/2022  ?  4:25 AM 04/13/2022  ?  3:17 AM 04/12/2022  ?  1:28 AM  ?CMP  ?Glucose 70 - 99 mg/dL 92   90   81    ?BUN 6 - 20 mg/dL '8   9   9    '$ ?Creatinine 0.44 - 1.00 mg/dL 0.98   0.77   0.83     ?Sodium 135 - 145 mmol/L 132   135   139    ?Potassium 3.5 - 5.1 mmol/L 4.2   4.4   4.2    ?Chloride 98 - 111 mmol/L  103   106   111    ?CO2 22 - 32 mmol/L '22   22   23    '$ ?Calcium 8.9 - 10.3 mg/dL 9.4   9.0   8.7    ? ? ? ?Pertinent Imaging today ?Plain films and CT images have been personally visualized and interpreted; radiology reports have been reviewed. Decision making incorporated into the Impression / Recommendations. ? ?DG FL GUIDED LUMBAR PUNCTURE ? 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 Lumbar puncture attempted on the floor without success. Request for image guided lumbar puncture due to concern for active CNS toxoplasmosis encephalitis. EXAM: DIAGNOSTIC LUMBAR PUNCTURE UNDER FLUOROSCOPIC GUIDANCE COMPARISON:  None FLUOROSCOPY: Radiation Exposure Index (as provided by the fluoroscopic device): 13.7 mGy Kerma PROCEDURE: Informed consent was obtained from the patient prior to the procedure, including potential complications of headache, allergy, and pain. With the patient prone, the lower back was prepped with Betadine. 1% Lidocaine was used for local anesthesia. Lumbar puncture was performed at the L4-5 level using a 20 gauge 3 1/2 inch needle with return of clear, colorless CSF with an opening pressure of 12 cm water. 11 ml of CSF were obtained for laboratory studies. The patient tolerated the procedure well and there were no apparent complications. IMPRESSION: Successful image guided lumbar puncture at L4-5 level Performed by Candiss Norse, PA-C Electronically Signed   By: Kerby Moors M.D.   On: 04/13/2022 12:30   ? ? ?I spent 50 minutes for this patient encounter including review of prior medical records, coordination of care with primary/other specialist with greater than 50% of time  being face to face/counseling and discussing diagnostics/treatment plan with the patient/family. ? ?Electronically signed by:  ? ?Rosiland Oz, MD ?Infectious Disease Physician ?[facility] f

## 2022-04-15 NOTE — Progress Notes (Signed)
FPTS Brief Note ?Reviewed patient's vitals, recent notes.  ?Vitals:  ? 04/14/22 1930 04/14/22 2321  ?BP: 96/85 105/77  ?Pulse: 82 85  ?Resp: 19 17  ?Temp: 98.4 ?F (36.9 ?C) 98.2 ?F (36.8 ?C)  ?SpO2: 98% 99%  ? ?At this time, no change in plan from day progress note.  ?Holley Bouche, MD ?Page [phone] with questions about this patient.  ? ? ?

## 2022-04-16 ENCOUNTER — Other Ambulatory Visit (HOSPITAL_COMMUNITY): Payer: Self-pay

## 2022-04-16 ENCOUNTER — Encounter: Payer: Self-pay | Admitting: *Deleted

## 2022-04-16 ENCOUNTER — Other Ambulatory Visit: Payer: Self-pay | Admitting: Student

## 2022-04-16 DIAGNOSIS — B589 Toxoplasmosis, unspecified: Secondary | ICD-10-CM

## 2022-04-16 DIAGNOSIS — R569 Unspecified convulsions: Secondary | ICD-10-CM | POA: Diagnosis not present

## 2022-04-16 LAB — CD4/CD8 (T-HELPER/T-SUPPRESSOR CELL)
CD4 % Helper T Cell: 12.5
CD4 Count: 238
CD8 % Suppressor T Cell: 58.4
CD8 T Cell Abs: 1110

## 2022-04-16 LAB — CULTURE, BLOOD (ROUTINE X 2)
Culture: NO GROWTH
Culture: NO GROWTH
Special Requests: ADEQUATE
Special Requests: ADEQUATE

## 2022-04-16 LAB — CSF CULTURE W GRAM STAIN: Culture: NO GROWTH

## 2022-04-16 LAB — BASIC METABOLIC PANEL
Anion gap: 7 (ref 5–15)
BUN: 8 mg/dL (ref 6–20)
CO2: 22 mmol/L (ref 22–32)
Calcium: 9.3 mg/dL (ref 8.9–10.3)
Chloride: 104 mmol/L (ref 98–111)
Creatinine, Ser: 0.92 mg/dL (ref 0.44–1.00)
GFR, Estimated: >60 mL/min (ref >60)
Glucose, Bld: 96 mg/dL (ref 70–99)
Potassium: 4.4 mmol/L (ref 3.5–5.1)
Sodium: 133 mmol/L — ABNORMAL LOW (ref 135–145)

## 2022-04-16 LAB — EPSTEIN BARR VRS(EBV DNA BY PCR): EBV DNA QN by PCR: NEGATIVE IU/mL

## 2022-04-16 LAB — TOXOPLASMA GONDII, PCR
Toxoplasma Gondii, PCR: NEGATIVE
Toxoplasma Gondii, PCR: NEGATIVE

## 2022-04-16 MED ORDER — SULFAMETHOXAZOLE-TRIMETHOPRIM 400-80 MG/5ML IV SOLN: 10.0000 mg/kg/d | Freq: Two times a day (BID) | INTRAVENOUS | 0 refills | Status: AC

## 2022-04-16 MED ORDER — LEVETIRACETAM 500 MG PO TABS
500.0000 mg | ORAL_TABLET | Freq: Two times a day (BID) | ORAL | 0 refills | Status: DC
  Filled 2022-04-16: qty 60, 30d supply, fill #0

## 2022-04-16 NOTE — Progress Notes (Signed)
PHARMACY CONSULT NOTE FOR: ? ?OUTPATIENT  PARENTERAL ANTIBIOTIC THERAPY (OPAT) ? ?Indication: CNS Toxoplasmosis  ?Regimen: sulfamethoxazole-trimethoprim IV 415 mg q12h ?End date: 5/17 ? ?IV antibiotic discharge orders are pended. ?To discharging provider:  please sign these orders via discharge navigator,  ?Select New Orders & click on the button choice - Manage This Unsigned Work.  ?  ? ?Thank you for allowing pharmacy to be a part of this patient's care. ? ?Pauletta Browns ?04/16/2022, 9:14 AM ? ?

## 2022-04-16 NOTE — Progress Notes (Signed)
Family Medicine Teaching Service ?Daily Progress Note ?Intern Pager: [phone] ? ?Patient name: Kelsey Rodriguez Medical record number: [id number] ?Date of birth: 1991/07/04 Age: 31 y.o. Gender: female ? ?Primary Care Provider: Gerlene Fee, DO ?Consultants: ID, neurology ?Code Status: Full ? ?Pt Overview and Major Events to Date:  ?4/28 admitted with seizure, MRI abnormal-neuro and ID consulted ?4/30 IR guided LP ? ?Assessment and Plan: ? ?Kelsey Rodriguez is a 31 year old female presenting to 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 ?Generalized tonic-clonic seizure hx ?CSF fungus, acid-fast, VDRL, JC virus, CMV, toxo in process. Toxo reactivation vs IRIS possible differentials. Nos eizure like activity ?-Neuro and ID following ?-IV bactrim-PICC in place, plan for home with home health ?-keppra 500 mg BID  ?-await LP cultures ?-seizure precautions ?-up with assistance ?-ativan prn for seizure greater than 5 minutes ? ?HIV/AIDS  Hx of CNS toxo ?Concern for reactivation of toxoplasmosis vs IRIS, CD$ count 353. ?-ID following appreciate care and recs. ?-Biktarvy ?-IV Bactrim ?-Await LP cultures ?-Repeat MRI with and without contrast in 2 weeks to monitor ring enhancing lesions 5/12 ? ?FEN/GI: Regular ?PPx: SCDs ?Dispo: Home with [facility] likely today or tomorrow pending IV nurse set up ? ?Subjective:  ?Patient denies any headaches, vision changes, no nausea or chest pain.  Would like to go home with home health. ? ?Objective: ?Temp:  [97.5 ?F (36.4 ?C)-98.6 ?F (37 ?C)] 97.5 ?F (36.4 ?C) (05/03 0334) ?Pulse Rate:  [77-92] 84 (05/03 0336) ?Resp:  [15-18] 18 (05/03 0334) ?BP: (90-118)/(62-81) 92/69 (05/03 4917) ?SpO2:  [99 %-100 %] 100 % (05/03 0334) ?Physical  Exam: ?General: NAD, laying in bed comfortably ?Cardiovascular: RRR no murmurs rubs or gallops ?Respiratory: Clear to auscultation bilaterally no wheezes rales or crackles ?Abdomen: Nontender to palpation, soft ?Extremities: No lower extremity edema ? ?Laboratory: ?Recent Labs  ?Lab 04/12/22 ?0128 04/14/22 ?9150 04/15/22 ?0425  ?WBC 5.2 5.8 5.3  ?HGB 11.5* 11.6* 11.3*  ?HCT 35.5* 33.2* 34.1*  ?PLT 201 190 188  ? ?Recent Labs  ?Lab 04/09/22 ?0950 04/11/22 ?0333 04/12/22 ?0128 04/13/22 ?5697 04/15/22 ?0425 04/16/22 ?0345  ?NA  --  137   < > 135 132* 133*  ?K  --  4.0   < > 4.4 4.2 4.4  ?CL  --  108   < > 106 103 104  ?CO2  --  23   < > '22 22 22  '$ ?BUN  --  14   < > '9 8 8  '$ ?CREATININE  --  1.00   < > 0.77 0.98 0.92  ?CALCIUM  --  9.1   < > 9.0 9.4 9.3  ?PROT 8.0 7.9  --   --   --   --   ?BILITOT 0.3 0.3  --   --   --   --   ?ALKPHOS  --  70  --   --   --   --   ?ALT 15 18  --   --   --   --   ?AST 15 24  --   --   --   --   ?GLUCOSE  --  81   < > 90 92 96  ? < > = values in this interval not displayed.  ? ? ? ? ?Imaging/Diagnostic Tests: ? ? ?Gerrit Heck, MD ?04/16/2022, 5:49  AM ?PGY-1, [place] [facility] ?[place] Intern pager: [phone], text pages welcome  ?

## 2022-04-16 NOTE — Progress Notes (Signed)
FPTS Brief Note ?Reviewed patient's vitals, recent notes.  ?Vitals:  ? 04/15/22 1940 04/15/22 2344  ?BP: 111/76 98/62  ?Pulse: 92 79  ?Resp: 18 17  ?Temp: 98 ?F (36.7 ?C) 98.1 ?F (36.7 ?C)  ?SpO2: 100% 99%  ? ?At this time, no change in plan from day progress note.  ?Gladys Damme, MD ?Page [phone] with questions about this patient.  ? ? ?

## 2022-04-16 NOTE — Plan of Care (Signed)

## 2022-04-16 NOTE — Plan of Care (Signed)
?  Problem: Clinical Measurements: ?Goal: Ability to maintain clinical measurements within normal limits will improve ?Outcome: Progressing ?Goal: Diagnostic test results will improve ?Outcome: Progressing ?Goal: Respiratory complications will improve ?Outcome: Progressing ?  ?Problem: Activity: ?Goal: Risk for activity intolerance will decrease ?Outcome: Progressing ?  ?Problem: Nutrition: ?Goal: Adequate nutrition will be maintained ?Outcome: Progressing ?  ?

## 2022-04-16 NOTE — Progress Notes (Addendum)
Per Infectious Disease request ordered MRI for 2 weeks. Needs to be 2 weeks from discharge (May 12th). Sent message to referral coordinator as well. ?

## 2022-04-16 NOTE — TOC Initial Note (Signed)
Transition of Care (TOC) - Initial/Assessment Note  ? ? ?Patient Details  ?Name: Kelsey Rodriguez ?MRN: [id number] ?Date of Birth: 03/21/1991 ? ?Transition of Care (TOC) CM/SW Contact:    ?Cyndi Bender, RN ?Phone Number: ?04/16/2022, 3:26 PM ? ?Clinical Narrative:                 ?Spoke to patient regarding transition needs. Patient agreeable to discharge with home iv antibiotics. Spoke to [employer] with [facility] and discharge plan is Thursday after 2nd dose of antibiotics. Pam will come teach patient how to administer the antibiotics. Husband will be able to learn as well at home with home health nurse. Pam is reaching out with [facility] to do home health nurse.  ? ?Expected Discharge Plan: [place] ?Barriers to Discharge: Continued Medical Work up ? ? ?Patient Goals and CMS Choice ?Patient states their goals for this hospitalization and ongoing recovery are:: go thome ?CMS Medicare.gov Compare Post Acute Care list provided to:: Patient ?Choice offered to / list presented to : Patient ? ?Expected Discharge Plan and Services ?Expected Discharge Plan: [place] ?  ?Discharge Planning Services: CM Consult ?Post Acute Care Choice: Home Health ?Living arrangements for the past 2 months: [place] ?Expected Discharge Date: 04/15/22               ?  ?  ?  ?  ?  ?HH Arranged: RN, IV Antibiotics ?[place] Agency: [facility] ?Date HH Agency Contacted: 04/16/22 ?Time [place]: [phone] ?Representative spoke with at [place]: [place] ? ?Prior Living Arrangements/Services ?Living arrangements for the past 2 months: [place] ?Lives with:: Spouse ?Patient language and need for interpreter reviewed:: Yes ?Do you feel safe going back to the place where you live?: Yes      ?Need for Family Participation in Patient Care: Yes (Comment) ?Care giver support system in place?: Yes (comment) ?  ?Criminal Activity/Legal Involvement Pertinent to Current Situation/Hospitalization: No -  Comment as needed ? ?Activities of Daily Living ?Home Assistive Devices/Equipment: None ?ADL Screening (condition at time of admission) ?Patient's cognitive ability adequate to safely complete daily activities?: Yes ?Is the patient deaf or have difficulty hearing?: No ?Does the patient have difficulty seeing, even when wearing glasses/contacts?: No ?Does the patient have difficulty concentrating, remembering, or making decisions?: No ?Patient able to express need for assistance with ADLs?: No ?Does the patient have difficulty dressing or bathing?: No ?Independently performs ADLs?: Yes (appropriate for developmental age) ?Does the patient have difficulty walking or climbing stairs?: No ?Weakness of Legs: None ?Weakness of Arms/Hands: None ? ?Permission Sought/Granted ?Permission sought to share information with : Case Manager ?Permission granted to share information with : Yes, Verbal Permission Granted ?   ? Permission granted to share info w AGENCY: HH ?   ?   ? ?Emotional Assessment ?Appearance:: Appears stated age ?Attitude/Demeanor/Rapport: Engaged ?Affect (typically observed): Accepting ?Orientation: : Oriented to Self, Oriented to Place, Oriented to  Time, Oriented to Situation ?Alcohol / Substance Use: Not Applicable ?Psych Involvement: No (comment) ? ?Admission diagnosis:  Seizure ([place]) [R56.9] ?Patient Active Problem List  ? Diagnosis Date Noted  ? Screening for cervical cancer 03/18/2022  ? Eczema 02/28/2022  ? Tubo-ovarian abscess 04/25/2021  ? TOA (tubo-ovarian abscess) 04/15/2021  ? Cervical dysplasia, mild 12/06/2020  ? Encounter for contraceptive management 11/01/2020  ? STD exposure 11/01/2020  ? S/P craniotomy 05/16/2020  ? Brain tumor ([place]) 05/16/2020  ? Headache due to intracranial  disease 05/09/2020  ? Hypertension   ? Pain of upper abdomen   ? Suicide ideation   ? 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 Seizure ([place])   ? 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 Rotator cuff strain 01/26/2020  ? Toxoplasmosis 11/07/2019  ? AIDS (acquired immune deficiency syndrome) ([place]) 11/07/2019  ? Cerebral edema ([place]) 10/28/2019  ? Pruritus 08/29/2019  ? Tendinopathy of left shoulder 01/18/2019  ? Chronic pelvic pain in female 01/04/2019  ? Lower abdominal pain 06/21/2018  ? Syphilis 02/26/2018  ? Tuberculosis   ? ASCUS with positive high risk HPV cervical 09/14/2017  ? Acute right-sided low back pain with right-sided sciatica 08/24/2017  ? Complex regional pain syndrome 02/03/2017  ? Headache 10/28/2016  ? Avascular necrosis of bone of right hip ([place]) 04/04/2016  ? 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 Avascular necrosis of bone of left hip ([place]) 12/14/2015  ? 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  ? Vertigo 01/23/2015  ? Primary adrenal insufficiency ([place]) 01/03/2015  ? Chest pain 07/07/2013  ? HIV (human immunodeficiency virus infection) ([place]) 03/16/2012  ? 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 ?PCP:  Gerlene Fee, DO ?Pharmacy:   ?[id number] - [place], [place] ?[place] ?[place] [address] ?Phone: [phone] Fax: [phone] ? ?[facility] ?[address] ?[place] [address] ?Phone: [phone] Fax: [phone] ? ?[facility] ?[address] ?[place] [address] ?Phone: [phone] Fax: [phone] ? ? ? ? ?Social Determinants of Health (SDOH) Interventions ?  ? ?Readmission Risk Interventions ? ?  11/08/2019  ?  3:11 PM  ?Readmission Risk Prevention Plan  ?Transportation Screening Complete  ?PCP or Specialist Appt within 5-7 Days Complete  ?Home Care Screening Complete  ?Medication Review (RN CM) Complete  ? ? ? ?

## 2022-04-17 ENCOUNTER — Other Ambulatory Visit: Payer: Self-pay | Admitting: Internal Medicine

## 2022-04-17 ENCOUNTER — Other Ambulatory Visit: Payer: Self-pay | Admitting: Family Medicine

## 2022-04-17 ENCOUNTER — Other Ambulatory Visit (HOSPITAL_COMMUNITY): Payer: Self-pay

## 2022-04-17 DIAGNOSIS — B589 Toxoplasmosis, unspecified: Secondary | ICD-10-CM

## 2022-04-17 DIAGNOSIS — B20 Human immunodeficiency virus [HIV] disease: Secondary | ICD-10-CM

## 2022-04-17 MED ORDER — HEPARIN SOD (PORK) LOCK FLUSH 100 UNIT/ML IV SOLN
250.0000 [IU] | INTRAVENOUS | Status: AC | PRN
  Administered 2022-04-17: 250 [IU]
  Filled 2022-04-17: qty 2.5

## 2022-04-17 MED ORDER — ONDANSETRON 4 MG PO TBDP
8.0000 mg | ORAL_TABLET | Freq: Once | ORAL | Status: AC
  Administered 2022-04-17: 8 mg via ORAL
  Filled 2022-04-17: qty 2

## 2022-04-17 NOTE — Progress Notes (Signed)
Family Medicine Teaching Service ?Daily Progress Note ?Intern Pager: [phone] ? ?Patient name: Kelsey Rodriguez Medical record number: [id number] ?Date of birth: 1991/08/11 Age: 31 y.o. Gender: female ? ?Primary Care Provider: Gerlene Fee, DO ?Consultants: ID, neurology ?Code Status: Full ? ?Pt Overview and Major Events to Date:  ?4/28-admitted with seizure, MRI abnormal-neuro and ID consulted ?4/30- IR guided LP ? ?Assessment and Plan: ? 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 ?Generalized Tonic-Clonic Seizure Hx ?CSF EBV, Toxoplasma, culture not grown anything, CMV negative, VDRL non-reactive, negative BK. Ddx includes immune reconstitution, could be toxo reactivation as PCR not completely accurate at times.  ?-ID following, appreciate recs ?-IV bactrim-PICC in place-plan for [facility] for atleast 2 weeks (discussed plan with Dr. [place] Bali with ID this morning about antibiotic plan as well to continue this) ?-Keppra 500 mg BID ? ?HIV/AIDS  history of CNS toxo ?Concern for immune reactivation given toxoplasmosis was negative versus other opportunistic infection.  CD4 count 353. ?-ID following, appreciate recommendations and care ?-Biktarvy ?-IV Bactrim ?-Repeat MRI in 2 weeks to monitor lesions 5/12 ? ?Nausea ?Some nausea this morning, no abdominal pain or vomiting ?-Zofran 8 mg ODT  ? ?FEN/GI: Regular ?PPx: SCDs ?Dispo: Home with home health likely today ? ?Subjective:  ?No issues overnight  ? ?Objective: ?Temp:  [98.1 ?F (36.7 ?C)-98.7 ?F (37.1 ?C)] 98.2 ?F (36.8 ?C) (05/04 0355) ?Pulse Rate:  [76-96] 76 (05/04 0355) ?Resp:  [17-20] 20 (05/04 0355) ?BP: (101-114)/(72-88) 110/72 (05/04 0355) ?SpO2:  [98 %-100 %]  99 % (05/04 0355) ?Physical Exam: ?General: NAD, laying in bed comfortably ?Cardiovascular: RRR no m/r/g ?Respiratory: CTAB no w/r/c ?Abdomen: Nontender to palpAtion, soft  ?Extremities: No lower extremity edema ? ?Laboratory: ?Recent Labs  ?Lab 04/12/22 ?0128 04/14/22 ?9030 04/15/22 ?0425  ?WBC 5.2 5.8 5.3  ?HGB 11.5* 11.6* 11.3*  ?HCT 35.5* 33.2* 34.1*  ?PLT 201 190 188  ? ?Recent Labs  ?Lab 04/11/22 ?0333 04/12/22 ?0128 04/13/22 ?0923 04/15/22 ?0425 04/16/22 ?0345  ?NA 137   < > 135 132* 133*  ?K 4.0   < > 4.4 4.2 4.4  ?CL 108   < > 106 103 104  ?CO2 23   < > '22 22 22  '$ ?BUN 14   < > '9 8 8  '$ ?CREATININE 1.00   < > 0.77 0.98 0.92  ?CALCIUM 9.1   < > 9.0 9.4 9.3  ?PROT 7.9  --   --   --   --   ?BILITOT 0.3  --   --   --   --   ?ALKPHOS 70  --   --   --   --   ?ALT 18  --   --   --   --   ?AST 24  --   --   --   --   ?GLUCOSE 81   < > 90 92 96  ? < > = values in this interval not displayed.  ? ? ?Imaging/Diagnostic Tests: ? ? ?Gerrit Heck, MD ?04/17/2022, 7:26 AM ?PGY-1, [place] [facility] ?[place] Intern pager: [phone], text pages welcome  ?

## 2022-04-17 NOTE — Progress Notes (Signed)
FPTS Brief Note ?Reviewed patient's vitals, recent notes.  ?Vitals:  ? 04/16/22 1947 04/16/22 2328  ?BP: 110/79 112/76  ?Pulse: 79 85  ?Resp: 20 20  ?Temp: 98.1 ?F (36.7 ?C) 98.2 ?F (36.8 ?C)  ?SpO2: 98% 99%  ? ?At this time, no change in plan from day progress note.  ?Gladys Damme, MD ?Page [phone] with questions about this patient.  ? ? ?

## 2022-04-17 NOTE — Telephone Encounter (Signed)
Appointment 5/11 ?

## 2022-04-17 NOTE — Progress Notes (Signed)
Labs orders, twice weekly BMP and CBC. Lab appointments scheduled on Tues/Fri pattern. PCP Dr. Janus Molder to schedule any additional needed.  ? ?Ezequiel Essex, MD ? ?

## 2022-04-17 NOTE — Progress Notes (Addendum)
? ?[facility] Infectious Diseases Follow Up Note ? ?Patient Identification: ?Patient Name: Kelsey Rodriguez MRN: [id number] [name] Date: 04/11/2022  2:13 AM ?Age: 31 y.o.Today's Date: 04/17/2022 ? ? ?Reason for Visit: CNS toxo vs IRIS vs others  ? ?Principal Problem: ?  Seizure ([place]) ?Active Problems: ?  Toxoplasmosis ? ? ?Antibiotics: ?IV bactrim 4/28-c ? ?On Mondamin ? ?Lines/Hardware: rt and lt THA ? ?Interval Events: remains afebrile ? 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 ?Seizures - prior h/o seizure d/o, off antiseizure meds PTA; abnormal MRI brain findings. Neurology following. EEG negative for seizures. On Keppra.  ? ?-     s/p LP on 4/30 ( WBC 4, glucose and protein WNL), cultures with negative gram stain, Crypto ag negative, Cytology negative for malignant cells, mixed acute and chronic inflammation, EBV negative, BK PCR negative, T gondii PCR negative, CMV PCR negative, VDRL negative. CMV culture negative ? ?-      4/29 crypto ag negative, RPR reactive ( 1:2, similar low titres in the past), T pallidum abs NR ? ?HIV with h/o poor compliance and poor insight  ? ?Recommendations ?Primary team is arranging discharge home with Home health. They have also spoken with her husband to ensure compliance with her treatment as an alternative option to SNF. Recommend to continue IV bactrim, plan to continue through PICC for 2 weeks and repeat MRI brain WWO in 2 weeks into tx ( around 5/12) to monitor tx response. Toxo PCR is negative in CSF but is not highly sensitive test to r/o CNS toxo given her h/o prior poor compliance and prior brain biopsy s/o Toxoplasma. If no improvement in repeat MRI brain, may need to consider alternative causes including neurosurgery eval  for biopsy.  ? ?Given she is clinically doing well with improved headache, I would hold off on steroids although  there is a possibility of IRIS  ? ?Needs CBC and BMP twice a week while on IV bactrim.  ?Continue Biktarvy  ?Fu pending labs sent from CSF (JC virus, routine/fungal and AFB cultures) ?She has a fu with Dr Baxter Flattery on 5/11 at 3 pm ? ?Rest of the management as per the primary team. ?Thank you for the consult. Please page with pertinent questions or concerns. ? ?______________________________________________________________________ ?Subjective ?patient seen and examined at the bedside.  ?Denies headache, blurry vision ?She has some nausea, does not have good appetite ? ?Vitals ?BP 103/68 (BP Location: Left Arm)   Pulse 77   Temp 98.2 ?F (36.8 ?C) (Oral)   Resp 18   Ht '5\' 4"'$  (1.626 m)   Wt 83.6 kg   LMP 05/28/2021 (Approximate)   SpO2 99%   BMI 31.64 kg/m?  ? ?  ?Physical Exam ?Constitutional:  sitting up in the bed comfortable ?   Comments:  ? ?Cardiovascular:  ?   Rate and Rhythm: Normal rate and regular rhythm.  ?   Heart sounds:  ? ?Pulmonary:  ?   Effort: Pulmonary effort is normal.  ?   Comments:  ? ?Abdominal:  ?   Palpations: Abdomen is soft.  ?   Tenderness: non tender  ? ?Musculoskeletal:     ?   General: No swelling or tenderness.  ? ?Skin: ?   Comments: No cervical and axillary lymphadenopathy  ? ?Neurological:  ?   General: No focal deficit present.  ? ?Psychiatric:     ?   Mood and Affect: Mood normal.  ? ? ? ?  Pertinent Microbiology ?Results for orders placed or performed during the hospital encounter of 04/11/22  ?Urine Culture     Status: Abnormal  ? Collection Time: 04/11/22  2:53 AM  ? Specimen: In/Out Cath Urine  ?Result Value Ref Range Status  ? Specimen Description IN/OUT CATH URINE  Final  ? Special Requests   Final  ?  NONE ?Performed at [facility], [place] [address], [place], [place] [address] ?  ? Culture MULTIPLE SPECIES PRESENT, SUGGEST RECOLLECTION (A)  Final  ? Report Status 04/12/2022 FINAL  Final  ?Blood Culture (routine x 2)     Status: None  ? Collection Time: 04/11/22  3:13  AM  ? Specimen: BLOOD  ?Result Value Ref Range Status  ? Specimen Description BLOOD RIGHT ANTECUBITAL  Final  ? Special Requests   Final  ?  BOTTLES DRAWN AEROBIC AND ANAEROBIC Blood Culture adequate volume  ? Culture   Final  ?  NO GROWTH 5 DAYS ?Performed at [facility], [place] [address], [place], [place] [address] ?  ? Report Status 04/16/2022 FINAL  Final  ?Blood Culture (routine x 2)     Status: None  ? Collection Time: 04/11/22  3:35 AM  ? Specimen: BLOOD LEFT FOREARM  ?Result Value Ref Range Status  ? Specimen Description BLOOD LEFT FOREARM  Final  ? Special Requests   Final  ?  BOTTLES DRAWN AEROBIC AND ANAEROBIC Blood Culture adequate volume  ? Culture   Final  ?  NO GROWTH 5 DAYS ?Performed at [facility], [place] [address] [place], [place] [address] ?  ? Report Status 04/16/2022 FINAL  Final  ?Toxoplasma Gondii, PCR     Status: None  ? Collection Time: 04/13/22  3:17 AM  ? Specimen: CSF; Blood  ?Result Value Ref Range Status  ? Toxoplasma Gondii, PCR Negative Negative Final  ?  Comment: (NOTE) ?No Toxoplasma gondii DNA detected. ?This test was developed and its performance characteristics ?determined by [employer]. It has not been cleared or ?approved by the U.S. Food and Drug Administration. The FDA has ?determined that such clearance or approval is not necessary. This ?test is used for clinical purposes. It should not be regarded as ?investigational or research. ?Performed At: [facility] ?[address] [place], [place] [id number] ?Rush Farmer MD [id number] ?  ?CSF culture w Gram Stain     Status: None  ? Collection Time: 04/13/22 11:50 AM  ? Specimen: PATH Cytology CSF; Cerebrospinal Fluid  ?Result Value Ref Range Status  ? Specimen Description CSF  Final  ? Special Requests NONE  Final  ? Gram Stain   Final  ?  WBC PRESENT, PREDOMINANTLY MONONUCLEAR ?NO ORGANISMS SEEN ?CYTOSPIN SMEAR ?  ? Culture   Final  ?  NO GROWTH ?Performed at [facility], [place]  [address], [place], [place] [address] ?  ? Report Status 04/16/2022 FINAL  Final  ?Cytomegalovirus (CMV) Culture     Status: None  ? Collection Time: 04/13/22 11:50 AM  ? Specimen: PATH Cytology CSF; Cerebrospinal Fluid  ?Result Value Ref Range Status  ? Cytomegalovirus (CMV) Culture Comment  Final  ?  Comment: (NOTE) ?Preliminary Report: ?No Cytomegalovirus isolated at 24 hours. Next report to follow ?after 1 week. ?Performed At: [place] ?[address] [place], [place] [id number] ?Rush Farmer MD [id number] ?  ? Source of Sample CSF  Final  ?  Comment: Performed at [facility], [place] [address]  [place], [place] [address]  ?Anaerobic culture w Gram Stain     Status: None (Preliminary result)  ? Collection Time: 04/13/22 11:50 AM  ? Specimen: PATH Cytology CSF; Cerebrospinal Fluid  ?Result Value Ref Range Status  ? Specimen Description CSF  Final  ? Special Requests NONE  Final  ? Gram Stain   Final  ?  WBC PRESENT, PREDOMINANTLY MONONUCLEAR ?NO ORGANISMS SEEN ?CYTOSPIN SMEAR ?Performed at [facility], [place] [address] [place], [place] [address] ?  ? Culture   Final  ?  NO ANAEROBES ISOLATED; CULTURE IN PROGRESS FOR 5 DAYS  ? Report Status PENDING  Incomplete  ?Toxoplasma Gondii, PCR     Status: None  ? Collection Time: 04/13/22 11:50 AM  ? Specimen: PATH Cytology CSF; Cerebrospinal Fluid  ?Result Value Ref Range Status  ? Toxoplasma Gondii, PCR Negative Negative Final  ?  Comment: (NOTE) ?No Toxoplasma gondii DNA detected. ?This test was developed and its performance characteristics ?determined by [employer]. It has not been cleared or ?approved by the U.S. Food and Drug Administration. The FDA has ?determined that such clearance or approval is not necessary. This ?test is used for clinical purposes. It should not be regarded as ?investigational or research. ?Performed At: [place] ?[address] [place], [place] [id number] ?Rush Farmer MD [id number] ?  ? ?*Note:  Due to a large number of results and/or encounters for the requested time period, some results have not been displayed. A complete set of results can be found in Results Review.  ? ? ?Pertinent Lab. ? ?  Latest

## 2022-04-17 NOTE — Discharge Summary (Signed)
Family Medicine Teaching Service ?Hospital Discharge Summary ? ?Patient name: Kelsey Rodriguez Medical record number: [id number] ?Date of birth: 08-Aug-1991 Age: 31 y.o. Gender: female ?Date of Admission: 04/11/2022  Date of Discharge: 04/17/2022 ?Admitting Physician: France Ravens, MD ? ?Primary Care Provider: Gerlene Fee, DO ?Consultants: Neurology, ID ? ?Indication for Hospitalization: Seizure ? ?Discharge Diagnoses/Problem List:  ?Principal Problem: ?  Seizure ([place]) ?Active Problems: ?  Toxoplasmosis ? ?Disposition: Home with HH ? ?Discharge Condition: Stable ? ?Discharge Exam:  ?General: Well appearing, NAD, awake, alert, responsive to questions ?Head: Normocephalic atraumatic ?CV: Regular rate and rhythm no murmurs rubs or gallops ?Respiratory: Clear to ausculation bilaterally, no wheezes rales or crackles, chest rises symmetrically,  no increased work of breathing ?Abdomen: Soft, non-tender, non-distended, normoactive bowel sounds  ?Extremities: Moves upper and lower extremities freely, no edema in LE ?Neuro: No focal deficits ?Skin: No rashes or lesions visualized  ? 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 who was admitted to the family medicine teaching Service at [facility] for generalized tonic-clonic seizure. Her hospital course is detailed below: ? ?Generalized tonic-clonic seizure ?Keppra was started in the hospital. EEG was negative for seizure activity. CT head was unremarkable.  MRI with concerns for reactivation of toxoplasmosis vs immune reconstitution inflammatory syndrome (see below).   Neurology was consulted and recommended further work-up with LP, IR performed LP that demonstrated normal cytology, gram stain that was negative  and cultures without organisms. Patient did not have any further seizure activity during admission. She was discharged on Keppra 500 mg BID. ? ?HIV/AIDS ?H/o CNS toxoplasmosis ?H/o medication non-compliance ?Patient follows with Dr. Baxter Flattery with infectious disease outpatient. Patient somewhat compliant with home [place] but misses a few doses per week. During admission, CD4 count was 353, HIV viral load was 208. MRI showed interval increase in pre-existing ring enhancing lesions in the L frontal lobe with an increase in vasogenic edema. The findings were concerning for reactivation of toxoplasmosis vs immune reconstitution inflammatory syndrome. ID was consulted, and given MRI findings, recommended starting IV Bactrim for atleast 2 weeks in supervised setting, continuing on Biktarvy, and LP.  Patient had false positive RPR. Toxo, VDRL, CMV, BK, EBV CSF all negative. CSF fluid was negative for malignant cells and had some acute/chronic inflammation. ID continued bactrim for patient outpatient as PCR Toxo not as sensitive and hx of patient as well. Did not require steroids for possible IRIS. ? ?Intermittent palpitations  Reproducible chest pain ?EKG with NSR and troponin was negative and flat. Likely musculoskeletal. Resolved at discharge. ? ?History of avascular necrosis with bilateral hip replacement, stable ?Follows with orthopedic surgery outpatient. ? ?Other chronic conditions were medically managed with home medications and formulary alternatives as necessary  ? ? ?Issues for Follow Up:  ?Awaiting LP infectious labs. PCP and ID to follow up.  ?MRI scheduled in 2 weeks for f/u of progression of lesions. ID wants it for around May 12th, however it was initially scheduled later 5/26 by [facility] scheduler. PCP to follow up and ensure MRI scheduled correctly.  ?PCP to ensure connected with home health for antibiotics and ID. ?Per ID needs CBC and BMP twice a week while on IV bactrim. PCP to order multiple future labs.  Inpatient team scheduled her for several Tues/Fri [facility] lab appointments to  get her set up. ? ?Significant Procedures: LP IR guided ? ?Significant Labs and Imaging:  ?Recent Labs  ?Lab 04/12/22 ?0128 04/14/22 ?4503 04/15/22 ?0425  ?WBC 5.2 5.8 5.3  ?HGB 11.5* 11.6* 11.3*  ?HCT 35.5* 33.2* 34.1*  ?PLT 201 190 188  ? ?Recent Labs  ?Lab 04/11/22 ?0333 04/12/22 ?0128 04/13/22 ?8882 04/15/22 ?0425 04/16/22 ?0345  ?NA 137 139 135 132* 133*  ?K 4.0 4.2 4.4 4.2 4.4  ?CL 108 111 106 103 104  ?CO2 '23 23 22 22 22  '$ ?GLUCOSE 81 81 90 92 96  ?BUN '14 9 9 8 8  '$ ?CREATININE 1.00 0.83 0.77 0.98 0.92  ?CALCIUM 9.1 8.7* 9.0 9.4 9.3  ?MG  --  2.1  --   --   --   ?ALKPHOS 70  --   --   --   --   ?AST 24  --   --   --   --   ?ALT 18  --   --   --   --   ?ALBUMIN 3.9  --   --   --   --   ? ? ?Results/Tests Pending at Time of Discharge:  ?Pending labs: HIV-1 RNA ultraquant, JC virus PCR, Acid Fast culture/smear, fungus culture with stain ? ?Discharge Medications:  ?Allergies as of 04/17/2022   ? ?   Reactions  ? Hydrocodone Itching, Nausea Only  ? Tolerates Oxycodone  ? Tramadol Itching, Nausea Only  ? Tolerates oxycodone  ? ?  ? ?  ?Medication List  ?  ? ?STOP taking these medications   ? ?atovaquone 750 MG/5ML suspension ?Commonly known as: MEPRON ?  ?gabapentin 400 MG capsule ?Commonly known as: NEURONTIN ?  ?ibuprofen 800 MG tablet ?Commonly known as: ADVIL ?  ?lactulose 10 g packet ?Commonly known as: [place] ?  ?triamcinolone ointment 0.5 % ?Commonly known as: KENALOG ?  ? ?  ? ?TAKE these medications   ? ?acetaminophen 500 MG tablet ?Commonly known as: TYLENOL ?Take 500 mg by mouth every 6 (six) hours as needed for mild pain or headache. ?  ?Biktarvy 50-200-25 MG Tabs tablet ?Generic drug: bictegravir-emtricitabine-tenofovir AF ?TAKE 1 TABLET BY MOUTH DAILY. ?  ?levETIRAcetam 500 MG tablet ?Commonly known as: KEPPRA ?Take 1 tablet (500 mg total) by mouth 2 (two) times daily. ?  ?ONE-A-DAY WOMENS PO ?Take 1 tablet by mouth daily. gummy ?   ?polyethylene glycol 17 g packet ?Commonly known as: MIRALAX / GLYCOLAX ?Take 17 g by mouth daily. ?  ?REFRESH OP ?Place 1 drop into both eyes as needed (dry eyes). ?  ?sulfamethoxazole-trimethoprim 418.08 mg in dextrose 5 % 500 mL ?Inject 418.08 mg into the vein every 12 (twelve) hours for 14 days. ?  ? ?  ? ?  ?  ? ? ?  ?Discharge Care Instructions  ?(From admission, onward)  ?  ? ? ?  ? ?  Start     Ordered  ? 04/16/22 0000  Change dressing on IV access line weekly and PRN  (Home infusion instructions - [facility] )       ? 04/16/22 1515  ? ?  ?  ? ?  ? ? ?Discharge Instructions: Please refer to Patient Instructions section of EMR for full details.  Patient was counseled important signs and symptoms that should prompt return to medical care, changes in medications, dietary instructions, activity restrictions, and follow up appointments.  ? ?Follow-Up Appointments: ? Follow-up Information   ? ? Autry-Lott, Naaman Plummer, DO. Go  on 04/18/2022.   ?Specialty: Family Medicine ?Why: At 2:30pm. Please arrive at 2:15pm. This is your hospital follow up with your primary care doctor. ?Contact information: ?[address] [place] [address] ?[phone] ? ? ?  ?  ? ? Carlyle Basques, MD. Schedule an appointment as soon as possible for a visit in 1 week(s).   ?Specialty: Infectious Diseases ?Why: Make a follow up appointment with the infectious diseases doctor. ?Contact information: ?[address] ?[address] ?[place] [address] ?[phone] ? ? ?  ?  ? ? Autry-Lott, Simone, DO. Go on 05/02/2022.   ?Specialty: Family Medicine ?Why: At 2:30pm. Please arrive by 2:15pm. This is a second follow up with your PCP to ensure you are connected with neurology and infectious diseases, as well as doing great on your home antibiotics. ?Contact information: ?[address] [place] [address] ?[phone] ? ? ?  ?  ? ?  ?  ? ?  ? ? ?Gerrit Heck, MD ?04/17/2022, 9:47 PM ?PGY-1, [place]  ?

## 2022-04-17 NOTE — Progress Notes (Signed)
Kelsey Rossetti, RN case manager needs OPAT orders for home antibiotics from [facility], MD attending. Connected Milderd Meager with resident, MD Corinne Ports due to [place], MD not available via epic chat or amion.  ?

## 2022-04-18 ENCOUNTER — Encounter (HOSPITAL_COMMUNITY): Payer: Self-pay | Admitting: Emergency Medicine

## 2022-04-18 ENCOUNTER — Ambulatory Visit: Payer: Medicare Other | Admitting: Family Medicine

## 2022-04-18 ENCOUNTER — Emergency Department (HOSPITAL_COMMUNITY): Payer: Medicare Other

## 2022-04-18 ENCOUNTER — Other Ambulatory Visit (HOSPITAL_COMMUNITY): Payer: Self-pay

## 2022-04-18 ENCOUNTER — Encounter (HOSPITAL_COMMUNITY): Payer: Self-pay | Admitting: Student

## 2022-04-18 ENCOUNTER — Emergency Department: Payer: Self-pay

## 2022-04-18 ENCOUNTER — Emergency Department (HOSPITAL_COMMUNITY)
Admission: EM | Admit: 2022-04-18 | Discharge: 2022-04-18 | Disposition: A | Discharge status: Home / Self Care | Payer: Medicare Other | Attending: Emergency Medicine | Admitting: Emergency Medicine

## 2022-04-18 DIAGNOSIS — Z21 Asymptomatic human immunodeficiency virus [HIV] infection status: Secondary | ICD-10-CM | POA: Insufficient documentation

## 2022-04-18 DIAGNOSIS — T80218A Other infection due to central venous catheter, initial encounter: Secondary | ICD-10-CM | POA: Insufficient documentation

## 2022-04-18 DIAGNOSIS — Y848 Other medical procedures as the cause of abnormal reaction of the patient, or of later complication, without mention of misadventure at the time of the procedure: Secondary | ICD-10-CM | POA: Insufficient documentation

## 2022-04-18 DIAGNOSIS — Z452 Encounter for adjustment and management of vascular access device: Secondary | ICD-10-CM

## 2022-04-18 LAB — ACID FAST SMEAR (AFB, MYCOBACTERIA): Acid Fast Smear: NEGATIVE

## 2022-04-18 LAB — ANAEROBIC CULTURE W GRAM STAIN

## 2022-04-18 MED ORDER — SULFAMETHOXAZOLE-TRIMETHOPRIM 400-80 MG/5ML IV SOLN
10.0000 mg/kg/d | Freq: Two times a day (BID) | INTRAVENOUS | Status: DC
  Administered 2022-04-18: 418.08 mg via INTRAVENOUS
  Filled 2022-04-18 (×2): qty 26.13

## 2022-04-18 MED ORDER — SODIUM CHLORIDE 0.9% FLUSH: 10.0000 mL | INTRAVENOUS | Status: DC | PRN

## 2022-04-18 MED ORDER — BIKTARVY 50-200-25 MG PO TABS
1.0000 | ORAL_TABLET | Freq: Every day | ORAL | 11 refills | Status: DC
  Filled 2022-04-18: qty 30, 30d supply, fill #0
  Filled 2022-05-28: qty 30, 30d supply, fill #1
  Filled 2022-06-20: qty 30, 30d supply, fill #2
  Filled 2022-07-21: qty 30, 30d supply, fill #3
  Filled 2022-09-01: qty 30, 30d supply, fill #4
  Filled 2022-09-19: qty 30, 30d supply, fill #5
  Filled 2022-10-22: qty 30, 30d supply, fill #6
  Filled 2022-11-17 – 2022-11-19 (×2): qty 30, 30d supply, fill #7
  Filled 2022-12-19: qty 30, 30d supply, fill #8
  Filled 2023-01-16: qty 30, 30d supply, fill #9
  Filled 2023-02-25: qty 30, 30d supply, fill #10
  Filled 2023-03-19: qty 30, 30d supply, fill #11

## 2022-04-18 MED ORDER — SODIUM CHLORIDE 0.9% FLUSH: 10.0000 mL | Freq: Two times a day (BID) | INTRAVENOUS | Status: DC

## 2022-04-18 MED ORDER — CHLORHEXIDINE GLUCONATE CLOTH 2 % EX PADS: 6.0000 | MEDICATED_PAD | Freq: Every day | CUTANEOUS | Status: DC

## 2022-04-18 NOTE — ED Notes (Signed)
Iv team here 

## 2022-04-18 NOTE — ED Provider Triage Note (Signed)
Emergency Medicine Provider Triage Evaluation Note ? ?Breuna Bih Som , a 31 y.o. female  was evaluated in triage.  Pt complains of leaking PICC line.  Patient reports that she recently had PICC line procedure 1 week ago.  The patient reports that she was recently hospitalized.  Patient reports that she has been receiving antibiotics through PICC line at home.  Patient reports that today home health nurse came and noticed that the PICC line was leaking blood.  The patient states that very minimal amount of blood was leaking from the PICC line site, denies all review of systems. ? ?Review of Systems  ?Positive:  ?Negative:  ? ?Physical Exam  ?BP (!) 131/96 (BP Location: Left Arm)   Pulse 83   Temp 98.5 ?F (36.9 ?C) (Oral)   Resp 16   LMP 05/28/2021 (Approximate)   SpO2 100%  ?Gen:   Awake, no distress   ?Resp:  Normal effort  ?MSK:   Moves extremities without difficulty  ?Other:   ? ?Medical Decision Making  ?Medically screening exam initiated at 1:35 PM.  Appropriate orders placed.  Gaylen Bih Morton was informed that the remainder of the evaluation will be completed by another provider, this initial triage assessment does not replace that evaluation, and the importance of remaining in the ED until their evaluation is complete. ? ? ?  ?Azucena Cecil, PA-C ?04/18/22 1336 ? ?

## 2022-04-18 NOTE — ED Notes (Signed)
RN reached out to IV team, IV team states they will come exchange PICC after 7pm. RN notified PA. PA states we will do labs and antibiotics once new PICC is replaced.  ?

## 2022-04-18 NOTE — ED Provider Notes (Signed)
?[facility] ?Provider Note ? ? ?CSN: [id number] ?Arrival date & time: 04/18/22  1328 ? ?  ? ?History ? ?No chief complaint on file. ? ? ?Kelsey Rodriguez is a 31 y.o. female. ? ?The history is provided by the patient and medical records. No language interpreter was used.  ? ?This is a 31 year old female with significant history of HIV/AIDS and CNS toxoplasmosis, status post right stereotactic temporal craniectomy with tumor resection in the setting of medication noncompliance, tubo-ovarian abscess, recently admitted to family medicine teaching service for seizures.  She is here today due to leakage from her PICC line.  She had a PICC line placed and was given IV Bactrim due to patient having an interval increase in the please extend ring-enhancing lesion in the left frontal lobe with increased vasogenic edema concerning for reactivation soft toxoplasmosis versus immune reconstitution inflammatory syndrome.  She is supposed to be on IV Bactrim for at least 2 weeks.  She was discharged from hospital yesterday.  Patient mention her nurse came by to administer the medication but as she was flushing the line, and a begins to leak and she was unable to administer the medication.  Therefore patient recommended to come here.  She denies any significant arm pain no fever no other complaint. ? ?Home Medications ?Prior to Admission medications   ?Medication Sig Start Date End Date Taking? Authorizing Provider  ?acetaminophen (TYLENOL) 500 MG tablet Take 500 mg by mouth every 6 (six) hours as needed for mild pain or headache.    [provider]  ?bictegravir-emtricitabine-tenofovir AF (BIKTARVY) 50-200-25 MG TABS tablet TAKE 1 TABLET BY MOUTH DAILY. 04/18/22 04/18/23  Campbell Riches, MD  ?levETIRAcetam (KEPPRA) 500 MG tablet Take 1 tablet (500 mg total) by mouth 2 (two) times daily. 04/16/22 05/16/22  Ezequiel Essex, MD  ?Multiple Vitamins-Minerals (ONE-A-DAY WOMENS PO) Take 1  tablet by mouth daily. gummy    [provider]  ?polyethylene glycol (MIRALAX / GLYCOLAX) 17 g packet Take 17 g by mouth daily.    [provider]  ?Polyvinyl Alcohol-Povidone (REFRESH OP) Place 1 drop into both eyes as needed (dry eyes).    [provider]  ?sulfamethoxazole-trimethoprim 418.08 mg in dextrose 5 % 500 mL Inject 418.08 mg into the vein every 12 (twelve) hours for 14 days. 04/16/22 04/30/22  Gerrit Heck, MD  ?SUMAtriptan (IMITREX) 50 MG tablet Take 1 tablet (50 mg total) by mouth every 2 (two) hours as needed for migraine (Maximum dose: 100 mg per dose; 200 mg per 24 hours). May repeat in 2 hours if headache persists or recurs. 10/26/19 01/31/20  Caroline More, DO  ?   ? ?Allergies    ?Hydrocodone and Tramadol   ? ?Review of Systems   ?Review of Systems  ?All other systems reviewed and are negative. ? ?Physical Exam ?Updated Vital Signs ?BP (!) 131/96 (BP Location: Left Arm)   Pulse 83   Temp 98.5 ?F (36.9 ?C) (Oral)   Resp 16   LMP 05/28/2021 (Approximate)   SpO2 100%  ?Physical Exam ?Vitals and nursing note reviewed.  ?Constitutional:   ?   General: She is not in acute distress. ?   Appearance: She is well-developed.  ?HENT:  ?   Head: Atraumatic.  ?Eyes:  ?   Conjunctiva/sclera: Conjunctivae normal.  ?Pulmonary:  ?   Effort: Pulmonary effort is normal.  ?Musculoskeletal:  ?   Cervical back: Neck supple.  ?Skin: ?   Findings: No rash.  ?  Comments:  ?Noted to right upper arm with small amount of serous fluid noted at the incision site, no tenderness no erythema.  ?Neurological:  ?   Mental Status: She is alert.  ?Psychiatric:     ?   Mood and Affect: Mood normal.  ? ? ?ED Results / Procedures / Treatments   ?Labs ?(all labs ordered are listed, but only abnormal results are displayed) ?Labs Reviewed  ?CBC WITH DIFFERENTIAL/PLATELET  ?I-STAT CHEM 8, ED  ? ? ?EKG ?None ? ?Radiology ?[place] EKG SITE RITE ? ?Result Date: 04/18/2022 ?If [employer] not attached,  placement could not be confirmed due to current cardiac rhythm.  ? ?Procedures ?Procedures  ? ? ?Medications Ordered in ED ?Medications  ?sulfamethoxazole-trimethoprim (BACTRIM) 418.08 mg in dextrose 5 % 500 mL IVPB (has no administration in time range)  ? ? ?ED Course/ Medical Decision Making/ A&P ?  ?                        ?Medical Decision Making ?Amount and/or Complexity of Data Reviewed ?Labs: ordered. ? ? ?BP (!) 131/96 (BP Location: Left Arm)   Pulse 83   Temp 98.5 ?F (36.9 ?C) (Oral)   Resp 16   LMP 05/28/2021 (Approximate)   SpO2 100%  ? ?Patient is here for evaluation of her PICC line.  She had a PICC line placed in her right upper arm for IV Bactrim due to having been hospitalized for recent seizure and finding of interval increase in presenting ring-enhancing lesion in the left frontal lobe with increase in vasogenic edema.  This finding is concerning for reactivation of toxoplasmosis versus community reconstitution inflammatory syndrome.  IV nurse came by to administer to her medication today but noted that the PICC line was not flushing appropriately and would like to go have it exchanged therefore she is here for PICC line exchange.  She does not have any other complaint. ? ? ?5:30 PM ?There have been a delay to have PICC line IV access as there are no IV nurse available.  We will continue to work on this front.  I have ordered her IV antibiotic to be given in the meantime.  Patient may benefit from admission if we are unable to locate IV nurse team. ? ?6:37 PM ?Nurse informed that IV team will be able to replace PICC line in 2 hrs.  Will give IV abx once line is established.  Otherwise pt can be discharge home for further care.  Pt sign out to oncoming team.  ? ? ? ? ? ? ? ?Final Clinical Impression(s) / ED Diagnoses ?Final diagnoses:  ?PICC (peripherally inserted central catheter) in place  ? ? ?Rx / DC Orders ?ED Discharge Orders   ? ? None  ? ?  ? ? ?  ?Domenic Moras, PA-C ?04/18/22 1839 ? ?   ?Ezequiel Essex, MD ?04/18/22 2137 ? ?

## 2022-04-18 NOTE — Discharge Instructions (Signed)
Continue your antibiotics as prescribed and follow-up with your doctor.  Return to the ED with new or worsening symptoms. ?

## 2022-04-18 NOTE — Progress Notes (Signed)
Peripherally Inserted Central Catheter Exchange ? ?The IV Nurse has discussed with the patient and/or persons authorized to consent for the patient, the purpose of this procedure and the potential benefits and risks involved with this procedure.  The benefits include less needle sticks, lab draws from the catheter, and the patient may be discharged home with the catheter. Risks include, but not limited to, infection, bleeding, blood clot (thrombus formation), and puncture of an artery; nerve damage and irregular heartbeat and possibility to perform a PICC exchange if needed/ordered by physician.  Alternatives to this procedure were also discussed.  B[employer] Power PICC patient education guide, fact sheet on infection prevention and patient information card has been provided to patient /or left at bedside.   ? ?PICC Placement Documentation  ?PICC Single Lumen 04/18/22 Right 39 cm 2 cm (Active)  ?Indication for Insertion or Continuance of Line Home intravenous therapies (PICC only) 04/18/22 2025  ?Exposed Catheter (cm) 2 cm 04/18/22 2025  ?Site Assessment Clean, Dry, Intact 04/18/22 2025  ?Line Status Flushed;Saline locked;Blood return noted 04/18/22 2025  ?Dressing Type Securing device;Transparent 04/18/22 2025  ?Dressing Status Antimicrobial disc in place;Clean, Dry, Intact 04/18/22 2025  ?Dressing Intervention New dressing 04/18/22 2025  ?Dressing Change Due 04/25/22 04/18/22 2025  ? ? ? ? ? ?Shon Hale ?04/18/2022, 8:27 PM ? ?

## 2022-04-18 NOTE — ED Notes (Signed)
Vitals are behind due to IV team in [place] w pt doing a new PICC change  ?

## 2022-04-18 NOTE — ED Triage Notes (Signed)
Patient sent to ED by home health nurse. Patient states the home health nurse was changing the dressing for her PICC line and noticed a leak so she told her to go to the ED that it can be replaced. Home health nurse redressed PICC line, patient is alert, oriented, ambulatory, and in no apparent distress at this time. ?

## 2022-04-22 ENCOUNTER — Other Ambulatory Visit (HOSPITAL_COMMUNITY): Payer: Self-pay

## 2022-04-22 ENCOUNTER — Other Ambulatory Visit: Payer: Medicare Other

## 2022-04-22 LAB — CYTOMEGALOVIRUS (CMV) CULTURE - CMVCUL

## 2022-04-24 ENCOUNTER — Ambulatory Visit (INDEPENDENT_AMBULATORY_CARE_PROVIDER_SITE_OTHER): Payer: Medicare Other | Admitting: Internal Medicine

## 2022-04-24 ENCOUNTER — Encounter (INDEPENDENT_AMBULATORY_CARE_PROVIDER_SITE_OTHER): Payer: Medicare Other | Admitting: *Deleted

## 2022-04-24 ENCOUNTER — Encounter: Payer: Medicare Other | Admitting: *Deleted

## 2022-04-24 ENCOUNTER — Other Ambulatory Visit (HOSPITAL_COMMUNITY): Payer: Self-pay

## 2022-04-24 ENCOUNTER — Other Ambulatory Visit: Payer: Self-pay

## 2022-04-24 VITALS — BP 111/68 | HR 97 | Temp 98.1°F | Wt 180.3 lb

## 2022-04-24 VITALS — BP 111/68 | HR 97 | Temp 98.1°F | Resp 16 | Ht 64.0 in | Wt 180.0 lb

## 2022-04-24 DIAGNOSIS — R569 Unspecified convulsions: Secondary | ICD-10-CM | POA: Diagnosis not present

## 2022-04-24 DIAGNOSIS — Z006 Encounter for examination for normal comparison and control in clinical research program: Secondary | ICD-10-CM

## 2022-04-24 DIAGNOSIS — B582 Toxoplasma meningoencephalitis: Secondary | ICD-10-CM | POA: Diagnosis not present

## 2022-04-24 DIAGNOSIS — B20 Human immunodeficiency virus [HIV] disease: Secondary | ICD-10-CM

## 2022-04-24 DIAGNOSIS — Z91199 Patient's noncompliance with other medical treatment and regimen due to unspecified reason: Secondary | ICD-10-CM | POA: Diagnosis not present

## 2022-04-24 MED ORDER — ONDANSETRON 4 MG PO TBDP: 4.0000 mg | ORAL_TABLET | Freq: Three times a day (TID) | ORAL | 0 refills | Status: DC | PRN

## 2022-04-24 NOTE — Research (Signed)
Kelsey Rodriguez was here today for final visit for A5359. She is having to come off study since she had a seizure and would not qualify for the cabaneuva injections. She is currently going through 2 weeks of OP IV bactrim q 12 hrs and has a nurse come in twice a week. I could not get her labs while she was here but she has a PICC line and I will see if they can draw labs when she is seeing Dr. Baxter Flattery this afternoon. She came back at 3pm and we were able to get her labs drawn through her PICC line. ?

## 2022-04-24 NOTE — Progress Notes (Signed)
?RFV: follow up for hospitalization and CNS toxo ? ?Patient ID: Kelsey Rodriguez, female   DOB: 12-26-1990, 31 y.o.   MRN: [id number] ? ?HPI ? ?31 yo F with advanced hiv disease, takes biktarvy daily. CD 4 count of 358/VL 208, recently admitted for seizure disorder,  ?Loss of appetite since being on abtx.(Since hospital) still tolerating but does feel nausea with iv bactrim ?In the past oral bactrim hurt her stomach for which she didn't take meds in the past. ? ?Outpatient Encounter Medications as of 04/24/2022  ?Medication Sig  ? acetaminophen (TYLENOL) 500 MG tablet Take 500 mg by mouth every 6 (six) hours as needed for mild pain or headache.  ? bictegravir-emtricitabine-tenofovir AF (BIKTARVY) 50-200-25 MG TABS tablet TAKE 1 TABLET BY MOUTH DAILY.  ? levETIRAcetam (KEPPRA) 500 MG tablet Take 1 tablet (500 mg total) by mouth 2 (two) times daily.  ? Multiple Vitamins-Minerals (ONE-A-DAY WOMENS PO) Take 1 tablet by mouth daily. gummy  ? polyethylene glycol (MIRALAX / GLYCOLAX) 17 g packet Take 17 g by mouth daily.  ? Polyvinyl Alcohol-Povidone (REFRESH OP) Place 1 drop into both eyes as needed (dry eyes).  ? sulfamethoxazole-trimethoprim 418.08 mg in dextrose 5 % 500 mL Inject 418.08 mg into the vein every 12 (twelve) hours for 14 days.  ? [DISCONTINUED] SUMAtriptan (IMITREX) 50 MG tablet Take 1 tablet (50 mg total) by mouth every 2 (two) hours as needed for migraine (Maximum dose: 100 mg per dose; 200 mg per 24 hours). May repeat in 2 hours if headache persists or recurs.  ? ?No facility-administered encounter medications on file as of 04/24/2022.  ?  ? ?Patient Active Problem List  ? Diagnosis Date Noted  ? Eczema 02/28/2022  ? S/P craniotomy 05/16/2020  ? Headache due to intracranial disease 05/09/2020  ? Hypertension   ? 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 Seizure ([place])   ? Toxoplasmosis 11/07/2019  ? AIDS (acquired immune deficiency syndrome) ([place])  11/07/2019  ? Chronic pelvic pain in female 01/04/2019  ? Tuberculosis   ? Complex regional pain syndrome 02/03/2017  ? Primary adrenal insufficiency ([place]) 01/03/2015  ? 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 ? ? ?Health Maintenance Due  ?Topic Date Due  ? COVID-19 Vaccine (3 - Pfizer risk series) 06/27/2020  ?  ?Soc hx: no smoking or drinking ?Review of Systems ?12 point ros is negative except what is mentioned above ?Physical Exam  ? ?BP 111/68   Pulse 97   Temp 98.1 ?F (36.7 ?C) (Oral)   Resp 16   Ht '5\' 4"'$  (1.626 m)   Wt 180 lb (81.6 kg)   LMP 05/28/2021 (Approximate)   SpO2 98%   BMI 30.90 kg/m?   ?Physical Exam  ?Constitutional:  oriented to person, place, and time. appears well-developed and well-nourished. No distress.  ?HENT: [place]/AT, PERRLA, no scleral icterus ?Mouth/Throat: Oropharynx is clear and moist. No oropharyngeal exudate.  ?Cardiovascular: Normal rate, regular rhythm and normal heart sounds. Exam reveals no gallop and no friction rub.  ?No murmur heard.  ?Pulmonary/Chest: Effort normal and breath sounds normal. No respiratory distress.  has no wheezes.  ?Neck = supple, no nuchal rigidity ?Ext: picc line is c/d/i ?Lymphadenopathy: no cervical adenopathy. No axillary adenopathy ?Neurological: alert and oriented to person, place, and time.  ?Skin: Skin is warm and dry. No rash noted. No erythema.  ?Psychiatric: a normal mood and affect.  behavior is normal.  ? ?Lab Results  ?Component  Value Date  ? CD4TCELL SEE SEPARATE REPORT 04/11/2022  ? ?Lab Results  ?Component Value Date  ? CD4TABS 170 (L) 01/02/2022  ? CD4TABS 51 (L) 08/27/2021  ? CD4TABS 136 (L) 03/28/2021  ? ?Lab Results  ?Component Value Date  ? HIV1RNAQUANT 208 (H) 04/09/2022  ? ?Lab Results  ?Component Value Date  ? HEPBSAB POS (A) 09/13/2015  ? ?Lab Results  ?Component Value Date  ? LABRPR Reactive (A) 04/12/2022  ? ? ?CBC ?Lab Results  ?Component Value Date  ? WBC 5.3 04/15/2022  ? RBC 3.73 (L) 04/15/2022  ? HGB 11.3  (L) 04/15/2022  ? HCT 34.1 (L) 04/15/2022  ? PLT 188 04/15/2022  ? MCV 91.4 04/15/2022  ? MCH 30.3 04/15/2022  ? MCHC 33.1 04/15/2022  ? RDW 15.3 04/15/2022  ? LYMPHSABS 2.4 04/11/2022  ? MONOABS 0.7 04/11/2022  ? EOSABS 0.5 (H) 04/11/2022  ? ? ?BMET ?Lab Results  ?Component Value Date  ? NA 133 (L) 04/16/2022  ? K 4.4 04/16/2022  ? CL 104 04/16/2022  ? CO2 22 04/16/2022  ? GLUCOSE 96 04/16/2022  ? BUN 8 04/16/2022  ? CREATININE 0.92 04/16/2022  ? CALCIUM 9.3 04/16/2022  ? GFRNONAA >60 04/16/2022  ? GFRAA >60 08/30/2020  ? ? ? ? ?Assessment and Plan ? ?CNS toxo = unclear if she had element of immune reconstitution which would explain the worsening imaging/seizure. Unclear if she had great adherence to +atovaquone salvage regimen. Will check toxo titer. Plan to continue IV bactrim until she gets brain MRI at end of the month. ? ?Long term medication management= will check cr especially while on iv bactrim ? ?HIV disease= continue on biktarvy daily. VL in 200 while hospitalized ? ?Seizure= related to CNS toxo. Will continue with keppra ? ? ?

## 2022-04-24 NOTE — Progress Notes (Signed)
Labs drawn via PICC line per Dr. Baxter Flattery. Line flushed with 10 mL normal saline and clamped. Patient tolerated well. ?.Beryle Flock, RN ? ?

## 2022-04-25 ENCOUNTER — Encounter: Payer: Medicare Other | Admitting: *Deleted

## 2022-04-25 ENCOUNTER — Other Ambulatory Visit: Payer: Medicare Other

## 2022-04-25 ENCOUNTER — Telehealth: Payer: Self-pay

## 2022-04-25 NOTE — Telephone Encounter (Signed)
Per Dr. Baxter Flattery patient is to Extend IV Bactrim until 05/10/22. [name] with [facility] informed and verbalized understanding.  ?Kelsey Rodriguez T Kelsey Rodriguez ? ?

## 2022-04-25 NOTE — Telephone Encounter (Signed)
Thank you :)

## 2022-04-27 LAB — BILIRUBIN, DIRECT: Bilirubin, Direct: 0 mg/dL (ref 0.0–0.2)

## 2022-04-27 LAB — COMPREHENSIVE METABOLIC PANEL
AG Ratio: 1.3 (calc) (ref 1.0–2.5)
ALT: 12 U/L (ref 6–29)
AST: 14 U/L (ref 10–30)
Albumin: 4.5 g/dL (ref 3.6–5.1)
Alkaline phosphatase (APISO): 81 U/L (ref 31–125)
BUN/Creatinine Ratio: 11 (calc) (ref 6–22)
BUN: 13 mg/dL (ref 7–25)
CO2: 21 mmol/L (ref 20–32)
Calcium: 9.4 mg/dL (ref 8.6–10.2)
Chloride: 101 mmol/L (ref 98–110)
Creat: 1.2 mg/dL — ABNORMAL HIGH (ref 0.50–0.97)
Globulin: 3.6 g/dL (calc) (ref 1.9–3.7)
Glucose, Bld: 92 mg/dL (ref 65–99)
Potassium: 4.5 mmol/L (ref 3.5–5.3)
Sodium: 132 mmol/L — ABNORMAL LOW (ref 135–146)
Total Bilirubin: 0.3 mg/dL (ref 0.2–1.2)
Total Protein: 8.1 g/dL (ref 6.1–8.1)

## 2022-04-27 LAB — HIV-1 RNA QUANT-NO REFLEX-BLD
HIV 1 RNA Quant: 109 copies/mL — ABNORMAL HIGH
HIV-1 RNA Quant, Log: 2.04 Log copies/mL — ABNORMAL HIGH

## 2022-04-28 ENCOUNTER — Telehealth: Payer: Self-pay

## 2022-04-28 NOTE — Telephone Encounter (Signed)
No interactions. She is fine to get injection. Thank you!

## 2022-04-28 NOTE — Telephone Encounter (Signed)
Patient states she has an upcoming appointment on 5/22 for a steroid injection with Dr. Trevor Mace office. ? ?Wants to know if she needs to be concerned about any potential interactions between her IV Bactrim and the injection. ? ?Caryl Pina, CMA confirmed medication to be administered is methylprednisolone with lidocaine. ? ?Confirmed with patient that her IV antibiotics are planned to extend through 05/10/22 per note, and that [facility] was notified of the extension on 5/12. ? ?Patient stated understanding and had no additional questions. ? ?Binnie Kand, RN   ?

## 2022-04-29 ENCOUNTER — Other Ambulatory Visit: Payer: Medicare Other

## 2022-04-29 NOTE — Telephone Encounter (Signed)
Called patient to let her know that, per clinical pharmacist, no interactions and that she was fine to get the injection planned on 5/22. Patient stated understanding and had no additional questions. ? ?Binnie Kand, RN  ?

## 2022-05-02 ENCOUNTER — Ambulatory Visit: Payer: Medicare Other | Admitting: Family Medicine

## 2022-05-02 ENCOUNTER — Other Ambulatory Visit: Payer: Medicare Other

## 2022-05-05 ENCOUNTER — Encounter: Payer: Medicare Other | Admitting: Physical Medicine and Rehabilitation

## 2022-05-07 ENCOUNTER — Encounter: Payer: Self-pay | Admitting: Infectious Diseases

## 2022-05-09 ENCOUNTER — Telehealth: Payer: Self-pay

## 2022-05-09 ENCOUNTER — Ambulatory Visit (HOSPITAL_COMMUNITY)
Admission: RE | Admit: 2022-05-09 | Discharge: 2022-05-09 | Disposition: A | Discharge status: Home / Self Care | Payer: Medicare Other | Source: Ambulatory Visit | Attending: Family Medicine | Admitting: Family Medicine

## 2022-05-09 DIAGNOSIS — B589 Toxoplasmosis, unspecified: Secondary | ICD-10-CM | POA: Diagnosis present

## 2022-05-09 MED ORDER — GADOBUTROL 1 MMOL/ML IV SOLN
8.0000 mL | Freq: Once | INTRAVENOUS | Status: AC | PRN
  Administered 2022-05-09: 8 mL via INTRAVENOUS

## 2022-05-09 NOTE — Telephone Encounter (Signed)
Home Health nurse Rachael from [facility] called to report patient has had another seizure on Sunday but declined going to ED. She also told [facility] nurse that she has had severe headaches x 1 week but refused to go to a doctor because she has MRI Tuesday. HH explained she will not see a doctor during MRI. Patient still refused to contact any doctors regarding her headaches and seizures. Advised her that I will report these changes to provider as FYI.

## 2022-05-14 ENCOUNTER — Telehealth: Payer: Self-pay | Admitting: Internal Medicine

## 2022-05-14 ENCOUNTER — Other Ambulatory Visit: Payer: Self-pay | Admitting: Internal Medicine

## 2022-05-14 MED ORDER — LEVETIRACETAM 500 MG PO TABS: 500.0000 mg | ORAL_TABLET | Freq: Two times a day (BID) | ORAL | 0 refills | Status: DC

## 2022-05-14 NOTE — Telephone Encounter (Signed)
Called patient about report of her having seizure recently. She reports that she had episode on 5/13, where her husband witnessed her having uncontrolled movement/she was alert? But had some post ictal confusion. Has not happened since then. She is nearly out of her AED, she takes keppra '500mg'$  bid. She finished IV bactrim on Saturday, comes to clinic tomorrow for appt for picc line removal. Repeat mri showing improvement in CNS toxo lesions.

## 2022-05-15 ENCOUNTER — Other Ambulatory Visit: Payer: Self-pay

## 2022-05-15 ENCOUNTER — Ambulatory Visit (INDEPENDENT_AMBULATORY_CARE_PROVIDER_SITE_OTHER): Payer: Medicare Other | Admitting: Internal Medicine

## 2022-05-15 ENCOUNTER — Telehealth: Payer: Self-pay

## 2022-05-15 ENCOUNTER — Encounter: Payer: Self-pay | Admitting: Internal Medicine

## 2022-05-15 VITALS — BP 119/82 | HR 92 | Resp 16 | Ht 64.0 in | Wt 179.0 lb

## 2022-05-15 DIAGNOSIS — B582 Toxoplasma meningoencephalitis: Secondary | ICD-10-CM | POA: Diagnosis not present

## 2022-05-15 DIAGNOSIS — Z79899 Other long term (current) drug therapy: Secondary | ICD-10-CM

## 2022-05-15 DIAGNOSIS — B20 Human immunodeficiency virus [HIV] disease: Secondary | ICD-10-CM | POA: Diagnosis not present

## 2022-05-15 DIAGNOSIS — B3731 Acute candidiasis of vulva and vagina: Secondary | ICD-10-CM

## 2022-05-15 MED ORDER — SULFAMETHOXAZOLE-TRIMETHOPRIM 800-160 MG PO TABS: 1.0000 | ORAL_TABLET | Freq: Two times a day (BID) | ORAL | 4 refills | Status: DC

## 2022-05-15 NOTE — Progress Notes (Signed)
PICC Removal   Labs drawn via PICC line per Dr. Baxter Flattery. Line flushed well with good blood return.   PICC length & location:  right 39 cm  Removed per verbal order from:  Dr. Baxter Flattery  Blood thinners:  none per chart review Platelet count:  228 (05/09/22 [facility])   Site assessment: Dressing clean and dry. Extremity warm and dry with palpable radial pulse. No redness, drainage, or swelling present at insertion site. 2 cm exposed catheter, consistent with previous documentation.   Pre-removal vital signs: 1527 HR:  96 BP:  116/84 SpO2:  99%  RA  Patient placed in flat, supine position. No sutures present. Insertion site cleaned with CHG, catheter removed and petroleum dressing applied. Tip intact. Pressure held until hemostasis achieved.    Length of catheter removed:  39 cm   Education provided to patient:  Seek emergent care if you develop numbness or tingling, pain, redness, or swelling in the extremity. Seek emergent care if dressing becomes soaked with blood or if sharp pain, shortness of breath, chest pain, or neurological symptoms present.  No heavy lifting with affected arm for 48 hours, leave dressing in place for 24 hours. May shower once dressing is removed.  Notify the clinic if you develop a fever or if warmth, redness, tenderness, or drainage develop at the site.   Patient verbalized understanding and agreement, all questions answered. Patient tolerated procedure well and remained in clinic under the care of RN 30 minutes post removal in a flat position.   Post-observation vital signs: 1610 HR:  92 BP:  119/82 SpO2:  99% RA  Notified Carolynn Sayers, RN with [facility] pharmacy team of removal.    Beryle Flock, RN

## 2022-05-15 NOTE — Telephone Encounter (Signed)
Picc line pulled in office today per Dr Baxter Flattery. Will no longer need IV abx. Completed last dose Saturday. Sent this notice to  [place] team as well as [place] team.

## 2022-05-15 NOTE — Progress Notes (Signed)
RFV: follow up for CNS toxo  Patient ID: Kelsey Rodriguez, female   DOB: 1991/02/07, 31 y.o.   MRN: [id number]  HPI Kelsey Rodriguez is a 31yo F with hiv disease,439/VL 108 (may 2023) on biktarvy and hx of CNS toxo. 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 Tolerating bactrim IV which just stopped on 5/27. Her repeat brain mri showed reduction in edema and size of lesions but still present per my read. She reports that when she had to take oral bactrim it would make her have some nausea. Also she noticed still having symptoms of yeast infection.  Reviewed notes from other providers, labs and imaging since our last visit.  Outpatient Encounter Medications as of 05/15/2022  Medication Sig   acetaminophen (TYLENOL) 500 MG tablet Take 500 mg by mouth every 6 (six) hours as needed for mild pain or headache.   bictegravir-emtricitabine-tenofovir AF (BIKTARVY) 50-200-25 MG TABS tablet TAKE 1 TABLET BY MOUTH DAILY.   fluconazole (DIFLUCAN) 150 MG tablet SMARTSIG:1 Tablet(s) By Mouth Once a Week PRN   ibuprofen (ADVIL) 800 MG tablet Take 800 mg by mouth 2 (two) times daily as needed.   levETIRAcetam (KEPPRA) 500 MG tablet Take 1 tablet (500 mg total) by mouth 2 (two) times daily.   Multiple Vitamins-Minerals (ONE-A-DAY WOMENS PO) Take 1 tablet by mouth daily. gummy   ondansetron (ZOFRAN-ODT) 4 MG disintegrating tablet Take 1 tablet (4 mg total) by mouth every 8 (eight) hours as needed for nausea or vomiting.   polyethylene glycol (MIRALAX / GLYCOLAX) 17 g packet Take 17 g by mouth daily.   Polyvinyl Alcohol-Povidone (REFRESH OP) Place 1 drop into both eyes as needed (dry eyes).   sulfamethoxazole-trimethoprim (BACTRIM) 400-80 MG/5ML injection Inject into the vein. (Patient not taking: Reported on 05/15/2022)   [DISCONTINUED] SUMAtriptan (IMITREX) 50 MG tablet Take 1 tablet (50 mg total) by mouth every 2  (two) hours as needed for migraine (Maximum dose: 100 mg per dose; 200 mg per 24 hours). May repeat in 2 hours if headache persists or recurs.   No facility-administered encounter medications on file as of 05/15/2022.     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  HENT: Negative for congestion, sore throat, rhinorrhea, sneezing, trouble swallowing and sinus pressure.  Eyes: Negative for photophobia and visual disturbance.  Respiratory: Negative for cough, chest tightness, shortness of breath, wheezing and stridor.  Cardiovascular: Negative for chest pain, palpitations and leg swelling.  Gastrointestinal: Negative for nausea, vomiting, abdominal pain, diarrhea, constipation, blood in stool, abdominal distention and anal bleeding.  Genitourinary: Negative for dysuria, hematuria, flank pain and difficulty urinating.  Musculoskeletal: Negative  for myalgias, back pain, joint swelling, arthralgias and gait problem.  Skin: Negative for color change, pallor, rash and wound.  Neurological: Negative for dizziness, tremors, weakness and light-headedness.  Hematological: Negative for adenopathy. Does not  bruise/bleed easily.  Psychiatric/Behavioral: Negative for behavioral problems, confusion, sleep disturbance, dysphoric mood, decreased concentration and agitation.   Physical Exam   BP 117/83   Pulse (!) 101   Resp 16   Ht '5\' 4"'$  (1.626 m)   Wt 179 lb (81.2 kg)   LMP 05/28/2021 (Approximate)   SpO2 99%   BMI 30.73 kg/m   Physical Exam  Constitutional:  oriented to person, place, and time. appears well-developed and well-nourished. No distress.  HENT: [place]/AT, PERRLA, no scleral icterus Mouth/Throat: Oropharynx is clear and moist. No oropharyngeal exudate.  Cardiovascular: Normal rate, regular rhythm and normal heart sounds. Exam reveals no gallop and no friction rub.  No murmur heard.  Pulmonary/Chest: Effort normal and breath sounds normal. No respiratory distress.  has no wheezes.  Ext: picc line is c/d/i Neurological: alert and oriented to person, place, and time.  Skin: Skin is warm and dry. No rash noted. No erythema.  Psychiatric: a normal mood and affect.  behavior is normal.   Lab Results  Component Value Date   CD4TCELL SEE SEPARATE REPORT 04/11/2022   Lab Results  Component Value Date   CD4TABS 170 (L) 01/02/2022   CD4TABS 51 (L) 08/27/2021   CD4TABS 136 (L) 03/28/2021   Lab Results  Component Value Date   HIV1RNAQUANT 109 (H) 04/24/2022   Lab Results  Component Value Date   HEPBSAB POS (A) 09/13/2015   Lab Results  Component Value Date   LABRPR Reactive (A) 04/12/2022    CBC Lab Results  Component Value Date   WBC 5.3 04/15/2022   RBC 3.73 (L) 04/15/2022   HGB 11.3 (L) 04/15/2022   HCT 34.1 (L) 04/15/2022   PLT 188 04/15/2022   MCV 91.4 04/15/2022   MCH 30.3 04/15/2022   MCHC 33.1 04/15/2022   RDW 15.3 04/15/2022   LYMPHSABS 2.4 04/11/2022   MONOABS 0.7 04/11/2022   EOSABS 0.5 (H) 04/11/2022    BMET Lab Results  Component Value Date   NA 132 (L) 04/24/2022   K 4.5 04/24/2022   CL 101 04/24/2022   CO2 21 04/24/2022   GLUCOSE 92  04/24/2022   BUN 13 04/24/2022   CREATININE 1.20 (H) 04/24/2022   CALCIUM 9.4 04/24/2022   GFRNONAA >60 04/16/2022   GFRAA >60 08/30/2020      Assessment and Plan Cns toxoplasmosis, 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   Long term medication/toxicity of meds = will check cr function  Hiv disease = continue on biktarvy, continue to advocate for her to take daily and have good adherence  Hx of vaginal yeast infection = will refill fluconazole since having symptoms still.

## 2022-05-16 ENCOUNTER — Other Ambulatory Visit (HOSPITAL_COMMUNITY): Payer: Self-pay

## 2022-05-16 NOTE — Telephone Encounter (Signed)
Thank you :)

## 2022-05-17 LAB — FUNGAL ORGANISM REFLEX

## 2022-05-17 LAB — FUNGUS CULTURE WITH STAIN

## 2022-05-17 LAB — FUNGUS CULTURE RESULT

## 2022-05-18 ENCOUNTER — Other Ambulatory Visit: Payer: Self-pay

## 2022-05-18 ENCOUNTER — Encounter (HOSPITAL_COMMUNITY): Payer: Self-pay | Admitting: Emergency Medicine

## 2022-05-18 ENCOUNTER — Emergency Department (HOSPITAL_COMMUNITY)
Admission: EM | Admit: 2022-05-18 | Discharge: 2022-05-18 | Disposition: A | Discharge status: Home / Self Care | Payer: Medicare Other | Attending: Emergency Medicine | Admitting: Emergency Medicine

## 2022-05-18 DIAGNOSIS — M549 Dorsalgia, unspecified: Secondary | ICD-10-CM | POA: Diagnosis present

## 2022-05-18 DIAGNOSIS — M5416 Radiculopathy, lumbar region: Secondary | ICD-10-CM | POA: Insufficient documentation

## 2022-05-18 DIAGNOSIS — G8929 Other chronic pain: Secondary | ICD-10-CM | POA: Diagnosis not present

## 2022-05-18 LAB — TOXOPLASMA ANTIBODIES- IGG AND  IGM
Toxoplasma Antibody- IgM: <8 AU/mL
Toxoplasma IgG Ratio: >400 IU/mL — ABNORMAL HIGH

## 2022-05-18 LAB — CBC WITH DIFFERENTIAL/PLATELET
Absolute Monocytes: 281 cells/uL (ref 200–950)
Basophils Absolute: 20 cells/uL (ref 0–200)
Basophils Relative: 0.5 %
Eosinophils Absolute: 605 cells/uL — ABNORMAL HIGH (ref 15–500)
Eosinophils Relative: 15.5 %
HCT: 31.5 % — ABNORMAL LOW (ref 35.0–45.0)
Hemoglobin: 10.8 g/dL — ABNORMAL LOW (ref 11.7–15.5)
Lymphs Abs: 1634 cells/uL (ref 850–3900)
MCH: 31.6 pg (ref 27.0–33.0)
MCHC: 34.3 g/dL (ref 32.0–36.0)
MCV: 92.1 fL (ref 80.0–100.0)
MPV: 10.7 fL (ref 7.5–12.5)
Monocytes Relative: 7.2 %
Neutro Abs: 1361 cells/uL — ABNORMAL LOW (ref 1500–7800)
Neutrophils Relative %: 34.9 %
Platelets: 202 10*3/uL (ref 140–400)
RBC: 3.42 10*6/uL — ABNORMAL LOW (ref 3.80–5.10)
RDW: 14.8 % (ref 11.0–15.0)
Total Lymphocyte: 41.9 %
WBC: 3.9 10*3/uL (ref 3.8–10.8)

## 2022-05-18 LAB — COMPREHENSIVE METABOLIC PANEL
AG Ratio: 1.2 (calc) (ref 1.0–2.5)
ALT: 11 U/L (ref 6–29)
AST: 14 U/L (ref 10–30)
Albumin: 4.3 g/dL (ref 3.6–5.1)
Alkaline phosphatase (APISO): 72 U/L (ref 31–125)
BUN/Creatinine Ratio: 10 (calc) (ref 6–22)
BUN: 10 mg/dL (ref 7–25)
CO2: 25 mmol/L (ref 20–32)
Calcium: 9.5 mg/dL (ref 8.6–10.2)
Chloride: 105 mmol/L (ref 98–110)
Creat: 1 mg/dL — ABNORMAL HIGH (ref 0.50–0.97)
Globulin: 3.6 g/dL (calc) (ref 1.9–3.7)
Glucose, Bld: 109 mg/dL — ABNORMAL HIGH (ref 65–99)
Potassium: 3.8 mmol/L (ref 3.5–5.3)
Sodium: 139 mmol/L (ref 135–146)
Total Bilirubin: 0.3 mg/dL (ref 0.2–1.2)
Total Protein: 7.9 g/dL (ref 6.1–8.1)

## 2022-05-18 MED ORDER — OXYCODONE HCL 5 MG PO TABS: 2.5000 mg | ORAL_TABLET | Freq: Four times a day (QID) | ORAL | 0 refills | Status: DC | PRN

## 2022-05-18 MED ORDER — METHOCARBAMOL 500 MG PO TABS: 500.0000 mg | ORAL_TABLET | Freq: Two times a day (BID) | ORAL | 0 refills | Status: DC

## 2022-05-18 MED ORDER — METHYLPREDNISOLONE 4 MG PO TBPK: ORAL_TABLET | ORAL | 0 refills | Status: DC

## 2022-05-18 NOTE — ED Provider Notes (Signed)
[facility] [facility] EMERGENCY DEPARTMENT Provider Note   CSN: [id number] Arrival date & time: 05/18/22  0421     History  Chief Complaint  Patient presents with   Hip Pain    Claudie Bih Rodriguez is a 31 y.o. female with a past medical history of previous hip replacement who presents emergency department chief complaint of right-sided back and leg pain.  Patient has been having ongoing issues with this.  She claims that the pain radiates from her back down to the front of her leg.  She is scheduled for an epidural injection this coming Thursday but states that the pain was too severe so she came in because she was unable to sleep.  She denies weakness or numbness.  The history is provided by the patient.  Hip Pain      Home Medications Prior to Admission medications   Medication Sig Start Date End Date Taking? Authorizing Provider  acetaminophen (TYLENOL) 500 MG tablet Take 500 mg by mouth every 6 (six) hours as needed for mild pain or headache.    [provider]  bictegravir-emtricitabine-tenofovir AF (BIKTARVY) 50-200-25 MG TABS tablet TAKE 1 TABLET BY MOUTH DAILY. 04/18/22 04/18/23  Kelsey Riches, MD  fluconazole (DIFLUCAN) 150 MG tablet SMARTSIG:1 Tablet(s) By Mouth Once a Week PRN 05/05/22   [provider]  ibuprofen (ADVIL) 800 MG tablet Take 800 mg by mouth 2 (two) times daily as needed. 05/05/22   [provider]  levETIRAcetam (KEPPRA) 500 MG tablet Take 1 tablet (500 mg total) by mouth 2 (two) times daily. 05/14/22 06/13/22  Kelsey Basques, MD  Multiple Vitamins-Minerals (ONE-A-DAY WOMENS PO) Take 1 tablet by mouth daily. gummy    [provider]  ondansetron (ZOFRAN-ODT) 4 MG disintegrating tablet Take 1 tablet (4 mg total) by mouth every 8 (eight) hours as needed for nausea or vomiting. 04/24/22   Kelsey Basques, MD  polyethylene glycol (MIRALAX / GLYCOLAX) 17 g packet Take 17 g by mouth daily.    [provider]   Polyvinyl Alcohol-Povidone (REFRESH OP) Place 1 drop into both eyes as needed (dry eyes).    [provider]  sulfamethoxazole-trimethoprim (BACTRIM DS) 800-160 MG tablet Take 1 tablet by mouth 2 (two) times daily. Start with taking 1 tab daily x 7 days, then increase to twice a day 05/15/22   Kelsey Basques, MD  SUMAtriptan (IMITREX) 50 MG tablet Take 1 tablet (50 mg total) by mouth every 2 (two) hours as needed for migraine (Maximum dose: 100 mg per dose; 200 mg per 24 hours). May repeat in 2 hours if headache persists or recurs. 10/26/19 01/31/20  Kelsey More, DO      Allergies    Hydrocodone and Tramadol    Review of Systems   Review of Systems  Physical Exam Updated Vital Signs BP (!) 121/91 (BP Location: Right Arm)   Pulse 81   Temp 98.4 F (36.9 C) (Oral)   Resp 16   LMP 05/28/2021 (Approximate)   SpO2 100%  Physical Exam Vitals and nursing note reviewed.  Constitutional:      General: She is not in acute distress.    Appearance: She is well-developed. She is not diaphoretic.  HENT:     Head: Normocephalic and atraumatic.     Right Ear: External ear normal.     Left Ear: External ear normal.     Nose: Nose normal.     Mouth/Throat:     Mouth: Mucous membranes are moist.  Eyes:     General: No scleral icterus.    Conjunctiva/sclera: Conjunctivae normal.  Cardiovascular:     Rate and Rhythm: Normal rate and regular rhythm.     Heart sounds: Normal heart sounds. No murmur heard.   No friction rub. No gallop.  Pulmonary:     Effort: Pulmonary effort is normal. No respiratory distress.     Breath sounds: Normal breath sounds.  Abdominal:     General: Bowel sounds are normal. There is no distension.     Palpations: Abdomen is soft. There is no mass.     Tenderness: There is no abdominal tenderness. There is no guarding.  Musculoskeletal:     Cervical back: Normal range of motion.     Comments: No midline tenderness, normal range of motion, normal reflexes   Skin:    General: Skin is warm and dry.  Neurological:     Mental Status: She is alert and oriented to person, place, and time.  Psychiatric:        Behavior: Behavior normal.    ED Results / Procedures / Treatments   Labs (all labs ordered are listed, but only abnormal results are displayed) Labs Reviewed - No data to display  EKG None  Radiology No results found.  Procedures Procedures    Medications Ordered in ED Medications - No data to display  ED Course/ Medical Decision Making/ A&P                           Medical Decision Making Patient here with acute on chronic back pain exacerbation.  I reviewed the PDMP.  Pain medications ordered.  Patient has outpatient follow-up closely.  No signs of cauda equina.  Problems Addressed: Lumbar radiculopathy: chronic illness or injury with exacerbation, progression, or side effects of treatment           Final Clinical Impression(s) / ED Diagnoses Final diagnoses:  None    Rx / DC Orders ED Discharge Orders     None         Kelsey Mail, PA-C 37/90/24 [id number]    Delora Fuel, MD 53/29/92 [phone]

## 2022-05-18 NOTE — ED Triage Notes (Signed)
Pt with h/o hip replacement and upcoming appt for epidural with ortho comes in tonight for worsening pain causing her not to be able to sleep, states unable to wait for appt.  Pain has been present for months.  Pt is ambulatory in triage

## 2022-05-18 NOTE — Discharge Instructions (Signed)
SEEK IMMEDIATE MEDICAL ATTENTION IF: New numbness, tingling, weakness, or problem with the use of your arms or legs.  Severe back pain not relieved with medications.  Change in bowel or bladder control.  Increasing pain in any areas of the body (such as chest or abdominal pain).  Shortness of breath, dizziness or fainting.  Nausea (feeling sick to your stomach), vomiting, fever, or sweats.  

## 2022-05-19 ENCOUNTER — Telehealth: Payer: Self-pay

## 2022-05-19 NOTE — Telephone Encounter (Signed)
Transition Care Management Unsuccessful Follow-up Telephone Call  Date of discharge and from where:  05/18/2022 [facility]  Attempts:  1st Attempt  Reason for unsuccessful TCM follow-up call:  Left voice message

## 2022-05-20 ENCOUNTER — Encounter: Payer: Self-pay | Admitting: *Deleted

## 2022-05-20 NOTE — Telephone Encounter (Signed)
Transition Care Management Unsuccessful Follow-up Telephone Call  Date of discharge and from where:  05/18/2022 from [facility]  Attempts:  2nd Attempt  Reason for unsuccessful TCM follow-up call:  Left voice message

## 2022-05-21 ENCOUNTER — Other Ambulatory Visit (HOSPITAL_COMMUNITY): Payer: Self-pay

## 2022-05-21 NOTE — Telephone Encounter (Signed)
Transition Care Management Follow-up Telephone Call Date of discharge and from where: 05/18/2022 from [facility] How have you been since you were released from the hospital? Patient stated that she is feeling the same. Patient does have a follow up appt with Ortho tomorrow. Patient did not have any questions or concerns at this time.  Any questions or concerns? No  Items Reviewed: Did the pt receive and understand the discharge instructions provided? Yes  Medications obtained and verified? Yes  Other? No  Any new allergies since your discharge? No  Dietary orders reviewed? No Do you have support at home? Yes   Functional Questionnaire: (I = Independent and D = Dependent) ADLs: I  Bathing/Dressing- I  Meal Prep- I  Eating- I  Maintaining continence- I  Transferring/Ambulation- I  Managing Meds- I   Follow up appointments reviewed:  PCP Hospital f/u appt confirmed? No   Specialist Hospital f/u appt confirmed? Yes  Scheduled to see Ortho on 05/22/2022 @ 9:45am. Are transportation arrangements needed? No  If their condition worsens, is the pt aware to call PCP or go to the Emergency Dept.? Yes Was the patient provided with contact information for the PCP's office or ED? Yes Was to pt encouraged to call back with questions or concerns? Yes

## 2022-05-22 ENCOUNTER — Ambulatory Visit: Payer: Self-pay

## 2022-05-22 ENCOUNTER — Encounter: Payer: Self-pay | Admitting: Physical Medicine and Rehabilitation

## 2022-05-22 ENCOUNTER — Ambulatory Visit (INDEPENDENT_AMBULATORY_CARE_PROVIDER_SITE_OTHER): Payer: Medicare Other | Admitting: Physical Medicine and Rehabilitation

## 2022-05-22 DIAGNOSIS — M25551 Pain in right hip: Secondary | ICD-10-CM

## 2022-05-22 NOTE — Progress Notes (Unsigned)
Pt state right hip pain that comes from her back to her right thigh. Pt state walking, standing and sitting makes the pain worse. Pt state she takes pain mes to help ease her pain.  Numeric Pain Rating Scale and Functional Assessment Average Pain 6   In the last MONTH (on 0-10 scale) has pain interfered with the following?  1. General activity like being  able to carry out your everyday physical activities such as walking, climbing stairs, carrying groceries, or moving a chair?  Rating(10)   +Driver, -BT, -Dye Allergies.

## 2022-05-26 ENCOUNTER — Encounter: Payer: Medicare Other | Admitting: Physical Medicine and Rehabilitation

## 2022-05-28 ENCOUNTER — Other Ambulatory Visit (HOSPITAL_COMMUNITY): Payer: Self-pay

## 2022-05-30 LAB — ACID FAST CULTURE WITH REFLEXED SENSITIVITIES (MYCOBACTERIA): Acid Fast Culture: NEGATIVE

## 2022-06-06 MED ORDER — BUPIVACAINE HCL 0.25 % IJ SOLN
5.0000 mL | INTRAMUSCULAR | Status: AC | PRN
  Administered 2022-05-22: 5 mL via INTRA_ARTICULAR

## 2022-06-06 MED ORDER — TRIAMCINOLONE ACETONIDE 40 MG/ML IJ SUSP
60.0000 mg | INTRAMUSCULAR | Status: AC | PRN
  Administered 2022-05-22: 60 mg via INTRA_ARTICULAR

## 2022-06-12 ENCOUNTER — Other Ambulatory Visit: Payer: Self-pay | Admitting: Internal Medicine

## 2022-06-12 NOTE — Telephone Encounter (Signed)
Please advise on refill.

## 2022-06-18 ENCOUNTER — Other Ambulatory Visit (HOSPITAL_COMMUNITY): Payer: Self-pay

## 2022-06-20 ENCOUNTER — Other Ambulatory Visit (HOSPITAL_COMMUNITY): Payer: Self-pay

## 2022-06-24 ENCOUNTER — Other Ambulatory Visit (HOSPITAL_COMMUNITY): Payer: Self-pay

## 2022-07-21 ENCOUNTER — Other Ambulatory Visit (HOSPITAL_COMMUNITY): Payer: Self-pay

## 2022-07-25 ENCOUNTER — Other Ambulatory Visit (HOSPITAL_COMMUNITY): Payer: Self-pay

## 2022-08-04 ENCOUNTER — Ambulatory Visit: Payer: Medicare Other | Admitting: Internal Medicine

## 2022-08-12 ENCOUNTER — Ambulatory Visit (INDEPENDENT_AMBULATORY_CARE_PROVIDER_SITE_OTHER): Payer: Medicare Other | Admitting: Internal Medicine

## 2022-08-12 ENCOUNTER — Encounter: Payer: Self-pay | Admitting: Internal Medicine

## 2022-08-12 ENCOUNTER — Other Ambulatory Visit: Payer: Self-pay

## 2022-08-12 VITALS — BP 115/81 | HR 88 | Temp 97.7°F

## 2022-08-12 DIAGNOSIS — B20 Human immunodeficiency virus [HIV] disease: Secondary | ICD-10-CM | POA: Diagnosis not present

## 2022-08-12 DIAGNOSIS — B582 Toxoplasma meningoencephalitis: Secondary | ICD-10-CM

## 2022-08-12 DIAGNOSIS — Z659 Problem related to unspecified psychosocial circumstances: Secondary | ICD-10-CM

## 2022-08-12 DIAGNOSIS — R87612 Low grade squamous intraepithelial lesion on cytologic smear of cervix (LGSIL): Secondary | ICD-10-CM

## 2022-08-12 DIAGNOSIS — G43C0 Periodic headache syndromes in child or adult, not intractable: Secondary | ICD-10-CM | POA: Diagnosis not present

## 2022-08-12 DIAGNOSIS — Z91148 Patient's other noncompliance with medication regimen for other reason: Secondary | ICD-10-CM

## 2022-08-12 NOTE — Progress Notes (Unsigned)
RFV: follow up for hiv disease,  Patient ID: Kelsey Rodriguez, female   DOB: 06/21/1991, 31 y.o.   MRN: [id number]  HPI  31yo F with advanced hiv disease, CNS toxo, CD 4 count of 358/VL 109 (April 2023)  Last time she took biktarvy, stopped taking 4 days ago. But has not been taking any bactrim DS last taken over a month ago. Inconsistent with taking biktarvy - taking it up to 4 days a week.  Has had other drugs that she did not tolerate (such as symtuza)   Taking 5 classes for full time to get financial aid.    Outpatient Encounter Medications as of 08/12/2022  Medication Sig   acetaminophen (TYLENOL) 500 MG tablet Take 500 mg by mouth every 6 (six) hours as needed for mild pain or headache.   bictegravir-emtricitabine-tenofovir AF (BIKTARVY) 50-200-25 MG TABS tablet TAKE 1 TABLET BY MOUTH DAILY.   fluconazole (DIFLUCAN) 150 MG tablet SMARTSIG:1 Tablet(s) By Mouth Once a Week PRN   ibuprofen (ADVIL) 800 MG tablet Take 800 mg by mouth 2 (two) times daily as needed.   levETIRAcetam (KEPPRA) 500 MG tablet TAKE 1 TABLET(500 MG) BY MOUTH TWICE DAILY   methocarbamol (ROBAXIN) 500 MG tablet Take 1 tablet (500 mg total) by mouth 2 (two) times daily.   methylPREDNISolone (MEDROL DOSEPAK) 4 MG TBPK tablet Use as directed   Multiple Vitamins-Minerals (ONE-A-DAY WOMENS PO) Take 1 tablet by mouth daily. gummy   ondansetron (ZOFRAN-ODT) 4 MG disintegrating tablet Take 1 tablet (4 mg total) by mouth every 8 (eight) hours as needed for nausea or vomiting.   oxyCODONE (ROXICODONE) 5 MG immediate release tablet Take 0.5-1 tablets (2.5-5 mg total) by mouth every 6 (six) hours as needed for severe pain.   polyethylene glycol (MIRALAX / GLYCOLAX) 17 g packet Take 17 g by mouth daily.   Polyvinyl Alcohol-Povidone (REFRESH OP) Place 1 drop into both eyes as needed (dry eyes).   sulfamethoxazole-trimethoprim (BACTRIM DS) 800-160 MG tablet Take 1 tablet by mouth 2 (two) times daily. Start with taking 1  tab daily x 7 days, then increase to twice a day   [DISCONTINUED] SUMAtriptan (IMITREX) 50 MG tablet Take 1 tablet (50 mg total) by mouth every 2 (two) hours as needed for migraine (Maximum dose: 100 mg per dose; 200 mg per 24 hours). May repeat in 2 hours if headache persists or recurs.   No facility-administered encounter medications on file as of 08/12/2022.     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     Health Maintenance Due  Topic Date Due   HPV VACCINES (2 - Risk 3-dose SCDM series) 09/06/2018   COVID-19 Vaccine (3 - Pfizer risk series) 06/27/2020   INFLUENZA VACCINE  07/15/2022     Review of Systems  Physical Exam   LMP 05/28/2021 (Approximate)    Lab Results  Component Value Date   CD4TCELL SEE SEPARATE REPORT 04/11/2022   Lab Results  Component Value Date  CD4TABS 170 (L) 01/02/2022   CD4TABS 51 (L) 08/27/2021   CD4TABS 136 (L) 03/28/2021   Lab Results  Component Value Date   HIV1RNAQUANT 109 (H) 04/24/2022   Lab Results  Component Value Date   HEPBSAB POS (A) 09/13/2015   Lab Results  Component Value Date   LABRPR Reactive (A) 04/12/2022    CBC Lab Results  Component Value Date   WBC 3.9 05/15/2022   RBC 3.42 (L) 05/15/2022   HGB 10.8 (L) 05/15/2022   HCT 31.5 (L) 05/15/2022   PLT 202 05/15/2022   MCV 92.1 05/15/2022   MCH 31.6 05/15/2022   MCHC 34.3 05/15/2022   RDW 14.8 05/15/2022   LYMPHSABS 1,634 05/15/2022    MONOABS 0.7 04/11/2022   EOSABS 605 (H) 05/15/2022    BMET Lab Results  Component Value Date   NA 139 05/15/2022   K 3.8 05/15/2022   CL 105 05/15/2022   CO2 25 05/15/2022   GLUCOSE 109 (H) 05/15/2022   BUN 10 05/15/2022   CREATININE 1.00 (H) 05/15/2022   CALCIUM 9.5 05/15/2022   GFRNONAA >60 04/16/2022   GFRAA >60 08/30/2020      Assessment and Plan  HIV disease = will check labs;   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

## 2022-08-13 ENCOUNTER — Encounter: Payer: Medicare Other | Admitting: Family Medicine

## 2022-08-13 LAB — T-HELPER CELL (CD4) - (RCID CLINIC ONLY)
CD4 % Helper T Cell: 6 % — ABNORMAL LOW (ref 33–65)
CD4 T Cell Abs: 55 /uL — ABNORMAL LOW (ref 400–1790)

## 2022-08-14 LAB — COMPLETE METABOLIC PANEL WITH GFR
AG Ratio: 1.2 (calc) (ref 1.0–2.5)
ALT: 18 U/L (ref 6–29)
AST: 18 U/L (ref 10–30)
Albumin: 4.4 g/dL (ref 3.6–5.1)
Alkaline phosphatase (APISO): 81 U/L (ref 31–125)
BUN: 12 mg/dL (ref 7–25)
CO2: 24 mmol/L (ref 20–32)
Calcium: 9.4 mg/dL (ref 8.6–10.2)
Chloride: 106 mmol/L (ref 98–110)
Creat: 0.83 mg/dL (ref 0.50–0.97)
Globulin: 3.7 g/dL (calc) (ref 1.9–3.7)
Glucose, Bld: 80 mg/dL (ref 65–99)
Potassium: 3.8 mmol/L (ref 3.5–5.3)
Sodium: 140 mmol/L (ref 135–146)
Total Bilirubin: 0.4 mg/dL (ref 0.2–1.2)
Total Protein: 8.1 g/dL (ref 6.1–8.1)
eGFR: 95 mL/min/{1.73_m2} (ref >=60)

## 2022-08-14 LAB — CBC WITH DIFFERENTIAL/PLATELET
Absolute Monocytes: 319 cells/uL (ref 200–950)
Basophils Absolute: 20 cells/uL (ref 0–200)
Basophils Relative: 0.7 %
Eosinophils Absolute: 238 cells/uL (ref 15–500)
Eosinophils Relative: 8.2 %
HCT: 33 % — ABNORMAL LOW (ref 35.0–45.0)
Hemoglobin: 11.2 g/dL — ABNORMAL LOW (ref 11.7–15.5)
Lymphs Abs: 977 cells/uL (ref 850–3900)
MCH: 30.7 pg (ref 27.0–33.0)
MCHC: 33.9 g/dL (ref 32.0–36.0)
MCV: 90.4 fL (ref 80.0–100.0)
MPV: 11.6 fL (ref 7.5–12.5)
Monocytes Relative: 11 %
Neutro Abs: 1346 cells/uL — ABNORMAL LOW (ref 1500–7800)
Neutrophils Relative %: 46.4 %
Platelets: 176 10*3/uL (ref 140–400)
RBC: 3.65 10*6/uL — ABNORMAL LOW (ref 3.80–5.10)
RDW: 14.8 % (ref 11.0–15.0)
Total Lymphocyte: 33.7 %
WBC: 2.9 10*3/uL — ABNORMAL LOW (ref 3.8–10.8)

## 2022-08-14 LAB — HIV-1 RNA QUANT-NO REFLEX-BLD
HIV 1 RNA Quant: 244000 Copies/mL — ABNORMAL HIGH
HIV-1 RNA Quant, Log: 5.39 Log cps/mL — ABNORMAL HIGH

## 2022-08-14 LAB — RPR TITER: RPR Titer: 1:1 {titer} — ABNORMAL HIGH

## 2022-08-14 LAB — FLUORESCENT TREPONEMAL AB(FTA)-IGG-BLD: Fluorescent Treponemal ABS: NONREACTIVE

## 2022-08-14 LAB — RPR: RPR Ser Ql: REACTIVE — AB

## 2022-08-25 ENCOUNTER — Other Ambulatory Visit (HOSPITAL_COMMUNITY): Payer: Self-pay

## 2022-09-01 ENCOUNTER — Other Ambulatory Visit (HOSPITAL_COMMUNITY): Payer: Self-pay

## 2022-09-02 ENCOUNTER — Other Ambulatory Visit (HOSPITAL_COMMUNITY): Payer: Self-pay

## 2022-09-05 ENCOUNTER — Ambulatory Visit (INDEPENDENT_AMBULATORY_CARE_PROVIDER_SITE_OTHER): Payer: Medicare Other | Admitting: Medical

## 2022-09-05 ENCOUNTER — Other Ambulatory Visit: Payer: Self-pay

## 2022-09-05 ENCOUNTER — Other Ambulatory Visit (HOSPITAL_COMMUNITY)
Admission: RE | Admit: 2022-09-05 | Discharge: 2022-09-05 | Disposition: A | Discharge status: Home / Self Care | Payer: Medicare Other | Source: Ambulatory Visit | Attending: Family Medicine | Admitting: Family Medicine

## 2022-09-05 ENCOUNTER — Encounter: Payer: Self-pay | Admitting: Medical

## 2022-09-05 VITALS — BP 125/78 | HR 85 | Wt 180.4 lb

## 2022-09-05 DIAGNOSIS — N939 Abnormal uterine and vaginal bleeding, unspecified: Secondary | ICD-10-CM

## 2022-09-05 DIAGNOSIS — R87612 Low grade squamous intraepithelial lesion on cytologic smear of cervix (LGSIL): Secondary | ICD-10-CM | POA: Insufficient documentation

## 2022-09-05 DIAGNOSIS — B20 Human immunodeficiency virus [HIV] disease: Secondary | ICD-10-CM

## 2022-09-05 DIAGNOSIS — Z3202 Encounter for pregnancy test, result negative: Secondary | ICD-10-CM

## 2022-09-05 DIAGNOSIS — Z3009 Encounter for other general counseling and advice on contraception: Secondary | ICD-10-CM

## 2022-09-05 LAB — POCT PREGNANCY, URINE: Preg Test, Ur: NEGATIVE

## 2022-09-05 NOTE — Progress Notes (Signed)
History:  Kelsey Rodriguez is a 31 y.o. G1P0010 who presents to clinic today for colposcopy following abnormal pap smear, LSIL with +HPV. Patient would also like to discuss options for heavy periods with cramping. She states that she has had a bilateral salpingectomy and does not require birth control. She had unwanted side effects with Depo Provera.    The following portions of the patient's history were reviewed and updated as appropriate: allergies, current medications, family history, past medical history, social history, past surgical history and problem list.  Review of Systems:  Review of Systems  Constitutional:  Negative for fever and malaise/fatigue.  Gastrointestinal:  Negative for abdominal pain.  Genitourinary:        + vaginal bleeding  Neg - vaginal discharge      Objective:  Physical Exam BP 125/78   Pulse 85   Wt 180 lb 6.4 oz (81.8 kg)   LMP 05/28/2021 (Approximate)   BMI 30.97 kg/m  Physical Exam Exam conducted with a chaperone present.  Constitutional:      General: She is not in acute distress.    Appearance: Normal appearance. She is obese.  Cardiovascular:     Rate and Rhythm: Normal rate.  Pulmonary:     Effort: Pulmonary effort is normal.  Abdominal:     General: There is no distension.     Palpations: Abdomen is soft.  Genitourinary:    General: Normal vulva.     Vagina: Bleeding (small) present. No vaginal discharge.  Skin:    General: Skin is warm and dry.     Findings: No erythema.  Neurological:     Mental Status: She is alert and oriented to person, place, and time.  Psychiatric:        Mood and Affect: Mood normal.       Labs and Imaging Results for orders placed or performed in visit on 09/05/22 (from the past 24 hour(s))  Pregnancy, urine POC     Status: None   Collection Time: 09/05/22  9:59 AM  Result Value Ref Range   Preg Test, Ur NEGATIVE NEGATIVE   *Note: Due to a large number of results and/or encounters for  the requested time period, some results have not been displayed. A complete set of results can be found in Results Review.    Health Maintenance Due  Topic Date Due   HPV VACCINES (2 - Risk 3-dose SCDM series) 09/06/2018   COVID-19 Vaccine (3 - Pfizer risk series) 06/27/2020   INFLUENZA VACCINE  07/15/2022    Labs, imaging and previous visits in Epic and Care Everywhere reviewed   [place] NOTE  [name] is a 31 y.o. G1P0010 here for colposcopy for low-grade squamous intraepithelial neoplasia (LGSIL - encompassing HPV,mild dysplasia,CIN I) pap smear on 04/13/2022. Discussed role for HPV in cervical dysplasia, need for surveillance.  Patient given informed consent, signed copy in the chart, time out was performed.  Placed in lithotomy position. Cervix viewed with speculum and colposcope after application of acetic acid.   Colposcopy adequate? No  no mosaicism, no punctation, no abnormal vasculature, and acetowhite lesion(s) noted at 12 o'clock; biopsies obtained.  ECC specimen obtained. All specimens were labelled and sent to pathology.  Patient was given post procedure instructions.  Will follow up pathology and manage accordingly.  Routine preventative health maintenance measures emphasized.    Assessment & Plan:  1. LGSIL on Pap smear of cervix - Surgical pathology( [place]/ [facility])  2. HIV disease ([place])  3. Abnormal uterine bleeding - Patient is interested in discussing ablation with surgeon, will schedule to return for first available.    Luvenia Redden, PA-C 09/05/2022 1:32 PM

## 2022-09-08 LAB — SURGICAL PATHOLOGY

## 2022-09-19 ENCOUNTER — Other Ambulatory Visit (HOSPITAL_COMMUNITY): Payer: Self-pay

## 2022-10-02 ENCOUNTER — Other Ambulatory Visit (HOSPITAL_COMMUNITY): Payer: Self-pay

## 2022-10-07 ENCOUNTER — Ambulatory Visit: Payer: Medicare Other

## 2022-10-07 ENCOUNTER — Other Ambulatory Visit: Payer: Self-pay

## 2022-10-07 NOTE — Progress Notes (Unsigned)
  Therapist met with client as scheduled and discussed their progress. Therapist used active listening skills to provide client with opportunity to disclose previous challenges and successes since last session. Specific problem-solving skills were processed with client, including breaking down problems, brainstorming, evaluating, and choosing options. At times implementing a plan and evaluating and reevaluating results. Therapist assessed for SI/HI during session and will follow-up with client during the next session.  Client met with therapist as scheduled and presented alert; orient X4. At the start of session, client affect appeared euthymic; affect appeared to be congruent with client report. Client appeared to make connections between consequences, challenges and alternative thoughts of mental health recovery during this session. Client expressed their needs in the development of their treatment goals and described different communities of which can serve as an added support. Client expressed concerns with her marital issues. Client reports that her husband is becoming increasingly uncaring and less empathetic as time goes on. Client reports more than her one illness of HIV. Her more pressing concerns come with the fact that her current conditions is slowly preventing her from achieving life goals.   Client progress toward therapeutic goal(s) remain minimal at this time. Client denied SI/HI.

## 2022-10-09 ENCOUNTER — Telehealth: Payer: Self-pay | Admitting: Internal Medicine

## 2022-10-09 NOTE — Telephone Encounter (Signed)
Called pt and updated her on the [employer] Disability forms that were dropped of for Dr. Baxter Flattery to complete. Pt is aware that this is a form that her PCP will need to complete. She has provided one to her PCP and she will be following up with them.

## 2022-10-13 ENCOUNTER — Encounter: Payer: Self-pay | Admitting: Obstetrics and Gynecology

## 2022-10-13 ENCOUNTER — Ambulatory Visit (INDEPENDENT_AMBULATORY_CARE_PROVIDER_SITE_OTHER): Payer: Medicare Other | Admitting: Obstetrics and Gynecology

## 2022-10-13 VITALS — BP 118/88 | HR 96 | Ht 64.0 in | Wt 181.9 lb

## 2022-10-13 DIAGNOSIS — R102 Pelvic and perineal pain: Secondary | ICD-10-CM | POA: Diagnosis not present

## 2022-10-13 DIAGNOSIS — N920 Excessive and frequent menstruation with regular cycle: Secondary | ICD-10-CM

## 2022-10-13 MED ORDER — TRANEXAMIC ACID 650 MG PO TABS: 1300.0000 mg | ORAL_TABLET | Freq: Three times a day (TID) | ORAL | 2 refills | Status: AC

## 2022-10-13 NOTE — Progress Notes (Signed)
GYNECOLOGY OFFICE VISIT NOTE  History:   Kelsey Rodriguez is a 31 y.o. G1P0010 here today for discussion regarding options for her period.   Her periods are regular but heavier when they come than they used to be. They last 5-7 days but is spotting by day 4. She passes quarter size clots. She has used Depo in the past but had BTB a lot. She changes a regular pad Q3 hours. Last CBC on 8/29 and it was 11.2.   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 She had a LSO, right salpingectomy.   Last [place] was in January 2023 and was normal.     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   Acute right-sided low back pain with right-sided sciatica 08/24/2017   Adrenal insufficiency ([place])    Anemia of chronic disease 03/11/2012   ASCUS with positive high risk HPV cervical 09/14/2017   Avascular necrosis of bone of left hip ([place]) 12/14/2015   Avascular necrosis of bone of right hip ([place]) 04/04/2016   Back pain of lumbar region with sciatica 02/12/2015   Bell's palsy 08/26/2013   Brain lesion    Brain tumor ([place]) 05/16/2020   Bullae 05/30/2012   Cerebral edema ([place]) 10/28/2019   Cervical dysplasia, mild 12/06/2020   Chronic back pain    Chronic leg pain    bilateral knees, ankles   Chronic pelvic pain in female 01/04/2019   Complex regional pain syndrome 02/03/2017   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[place])    Depression    Eczema 02/28/2022   Encephalitis, myelitis, 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[place]) 01/31/2020   Fatigue    GERD (gastroesophageal reflux disease)    Headache    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   HIV (human immunodeficiency virus infection) ([place]) 02/2012   Hypertension    Laceration of ankle, right 11/18/2012   Lower abdominal pain 06/21/2018   Lumbar radiculopathy    Meningitis 02/18/2018   Pain of upper abdomen    Pelvic pain    PID (acute pelvic inflammatory disease) 02/26/2018    Pneumonia    Reflux esophagitis 03/11/2012   Rotator cuff strain 01/26/2020   S/P craniotomy 05/16/2020   Seizure ([place])    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   Suicide ideation    Syphilis 02/26/2018   Tendinopathy of left shoulder 01/18/2019   TOA (tubo-ovarian abscess) 04/15/2021   Tuberculosis    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   Tubo-ovarian abscess 04/25/2021   Vertigo    Wears glasses     Past Surgical History:  Procedure Laterality Date   APPENDECTOMY  ~ 7026   APPLICATION OF CRANIAL NAVIGATION N/A 05/16/2020   Procedure: APPLICATION OF CRANIAL NAVIGATION;  Surgeon: Newman Pies, MD;  Location: [place];  Service: Neurosurgery;  Laterality: N/A;   CRANIOTOMY Right 05/16/2020   Procedure: Craniotomy for Resection of Lesion;  Surgeon: Newman Pies, MD;  Location: [place];  Service: Neurosurgery;  Laterality: Right;  right   DILATION AND CURETTAGE OF UTERUS  12/15/2006   ESOPHAGOGASTRODUODENOSCOPY  03/11/2012   Procedure: ESOPHAGOGASTRODUODENOSCOPY (EGD);  Surgeon: Lafayette Dragon, MD;  Location: [facility] END[facility]COPY;  Service: Endoscopy;  Laterality: N/A;   ESOPHAGOGASTRODUODENOSCOPY N/A 03/07/2014   Procedure: ESOPHAGOGASTRODUODENOSCOPY (EGD);  Surgeon: Gatha Mayer, MD;  Location: [place] [facility];  Service: Endoscopy;  Laterality:  N/A;   LAPAROSCOPIC OOPHERECTOMY  05/2021   [facility] - LSO and right salpingectomy   LUNG BIOPSY  02/13/2012   TOTAL HIP ARTHROPLASTY Left 12/14/2015   Procedure: LEFT TOTAL HIP ARTHROPLASTY ANTERIOR APPROACH;  Surgeon: Mcarthur Rossetti, MD;  [facility];  Service: Orthopedics;  Laterality: Left;   TOTAL HIP ARTHROPLASTY Right 04/04/2016   Procedure: RIGHT TOTAL HIP ARTHROPLASTY ANTERIOR APPROACH;  Surgeon: Mcarthur Rossetti, MD;  Location: [facility];  Service: Orthopedics;  Laterality: Right;    The following portions of the patient's history were reviewed and  updated as appropriate: allergies, current medications, past family history, past medical history, past social history, past surgical history and problem list.   Health Maintenance:   Diagnosis  Date Value Ref Range Status  03/18/2022 - Low grade squamous intraepithelial lesion (LSIL) (A)  Final  colpo 08/2022 - CIN on bx and ecc  Review of Systems:  Pertinent items noted in HPI and remainder of comprehensive ROS otherwise negative.  Physical Exam:  BP 118/88   Pulse 96   Ht '5\' 4"'$  (1.626 m)   Wt 181 lb 14.4 oz (82.5 kg)   LMP 09/29/2022 (Within Days)   BMI 31.22 kg/m  CONSTITUTIONAL: Well-developed, well-nourished female in no acute distress.  HEENT:  Normocephalic, atraumatic. External right and left ear normal. No scleral icterus.  NECK: Normal range of motion, supple, no masses noted on observation SKIN: No rash noted. Not diaphoretic. No erythema. No pallor. MUSCULOSKELETAL: Normal range of motion. No edema noted. NEUROLOGIC: Alert and oriented to person, place, and time. Normal muscle tone coordination. No cranial nerve deficit noted. PSYCHIATRIC: Normal mood and affect. Normal behavior. Normal judgment and thought content.  CARDIOVASCULAR: Normal heart rate noted RESPIRATORY: Effort and breath sounds normal, no problems with respiration noted ABDOMEN: No masses noted. No other overt distention noted.    PELVIC: Deferred  Labs and Imaging No results found. However, due to the size of the patient record, not all encounters were searched. Please check Results Review for a complete set of results. No results found.  Assessment and Plan:   1. Menorrhagia with regular cycle - Expectant management - The patient's fibroids were discussed and expectant management was offered with strict precautions. - TXA - this medication was discussed as a means to control vaginal bleeding. Discussed it would not impact growth of the fibroids either way. Its main advantage is avoiding hormonal or  surgical therapy and gives an option for therapy besides expectant management.  - We discussed progesterone only options including - POP, Depo Provera and Lng-IUD.  She declines POP bc she can't remember a pill every day. She declines Depo due to issues with BTB in the past.  - We discussed surgical/procedural options available: ablation and hysterectomy. We discussed the risks and benefits for each of these specific procedures. We discussed with the ablation this would not be ideal at her young age as she would likely start bleeding again and due to salpingectomy she would be at risk (10%) of post-ablation tubal syndrome. We also discussed hysterectomy and that she would be high risk of complication due to history of PID and TOA as she would be expected to have some scar tissue. Discussed risks of surgery and we discussed long term risks of hyst I.e impact on timing of menopause.  - Following counseling, the patient would like to  TXA and repeat US given recent CT showing possible TOA. If TXA unsuccessful, she may consider a surgical option.  -  US PELVIC COMPLETE WITH TRANSVAGINAL; Future -     tranexamic acid (LYSTEDA) 650 MG TABS tablet; Take 2 tablets (1,300 mg total) by mouth 3 (three) times daily. Take during menses for a maximum of five days   Routine preventative health maintenance measures emphasized. Please refer to After Visit Summary for other counseling recommendations.   No follow-ups on file.  Radene Gunning, MD, [place] for [facility], [place]

## 2022-10-13 NOTE — Progress Notes (Signed)
Patient in office to discuss ablation; patient c/o heavy bleeding with constant painful cramping after BTL procedure. Reports soaking through medium pads at least every 3-4 hours. Bleeds 5-6 days. Red blood with clots present, about the size of a beer cap. Pt feels that birth control has helped with reducing the heaviness of the bleeding, but still reports spotting everyday. Pt also c/o painful cramping with intercourse.

## 2022-10-14 ENCOUNTER — Ambulatory Visit: Payer: Medicare Other | Admitting: Internal Medicine

## 2022-10-20 ENCOUNTER — Other Ambulatory Visit (HOSPITAL_COMMUNITY): Payer: Self-pay

## 2022-10-20 ENCOUNTER — Ambulatory Visit
Admission: RE | Admit: 2022-10-20 | Discharge: 2022-10-20 | Disposition: A | Discharge status: Home / Self Care | Payer: Medicare Other | Source: Ambulatory Visit | Attending: Obstetrics and Gynecology | Admitting: Obstetrics and Gynecology

## 2022-10-20 DIAGNOSIS — N920 Excessive and frequent menstruation with regular cycle: Secondary | ICD-10-CM | POA: Diagnosis present

## 2022-10-22 ENCOUNTER — Other Ambulatory Visit (HOSPITAL_COMMUNITY): Payer: Self-pay

## 2022-10-23 ENCOUNTER — Telehealth: Payer: Self-pay

## 2022-10-23 NOTE — Telephone Encounter (Signed)
Detectable Viral Load Intervention   Most recent VL: 244,000 (08/12/22)  Current ART regimen: Biktarvy (not taking Bactrim or any other OI proph.)   Appointment status: patient has future appointment scheduled  Called patient to discuss medication adherence and possible barriers to care.    Questionnaire  Medication Adherence What pharmacy do you use for your ART? [facility]  Do you pick up your medication at the pharmacy or is it mailed to you? delivered by pharmacy . Last dispensed 10/02/22.  How often do you miss a dose your ART? a couple times a week  Are you experiencing any side effects with your ART? Experiences nausea if she takes it without food. Has been taking it at night with dinner. Sometimes has trouble swallowing.   Since your last visit, have you experienced any changes that have negatively impacted your health? Kelsey Rodriguez reports an increase in stress and depression.   Are you having any trouble remembering what medication(s) you are supposed to take or how you are supposed to take them? NO  What helps you remember to take your medication(s)? Takes with dinner at night.    Barriers to Care Are you experiencing any of the following?:  Lack of transportation to medical appointments? NO - says she knows to call her case manager Kelsey Rodriguez if she needs help with transportation.   Housing instability? NO  If you are currently employed, are you having difficulty taking time off of work for medical appointments? NO - not employed. Taking classes for culinary school. This is a source of stress for her, but she also enjoys the time it affords her to stay busy and get out of the house.   Financial concerns (rent, utilities, etc.) Her rent has increased by almost $400 a month. She has talked with [facility] case manager about this, but they do not meet the income requirement for assistance.   Lack of consistent access to food? She knows to call Kelsey Rodriguez with [facility] when she needs help with groceries.    Trouble remembering and attending your appointments? NO  Are you experiencing any other barriers that make it hard for you to come to appointments or take medication regularly?   [name] has personal and financial stressors at the moment that make it hard to prioritize medication adherence. She feels that she probably misses two doses of Biktarvy a week. She also reports that she is suffering from insomnia and is up most of the night.    Interventions:   Kelsey Rodriguez is happy with her case manager Kelsey Rodriguez and feels she is easily accessible. She enjoyed her first counseling session with Kelsey Rodriguez and would like to continue these visits.   To aid in swallowing Biktarvy, we discussed taking it with pudding/applesauce instead of th juice she normally uses.   Beryle Flock, RN

## 2022-10-23 NOTE — Telephone Encounter (Signed)
Per Dr. Baxter Flattery, patient should be on Bactrim for prophylaxis. Spoke with Nely and notified her she will need to restart the bactrim, she would like it sent to [facility] so that it can be delivered with her Biktarvy.   Beryle Flock, RN

## 2022-10-24 ENCOUNTER — Telehealth: Payer: Self-pay

## 2022-10-24 ENCOUNTER — Other Ambulatory Visit (HOSPITAL_COMMUNITY): Payer: Self-pay

## 2022-10-24 ENCOUNTER — Other Ambulatory Visit: Payer: Self-pay | Admitting: Pharmacist

## 2022-10-24 DIAGNOSIS — R9341 Abnormal radiologic findings on diagnostic imaging of renal pelvis, ureter, or bladder: Secondary | ICD-10-CM

## 2022-10-24 DIAGNOSIS — B20 Human immunodeficiency virus [HIV] disease: Secondary | ICD-10-CM

## 2022-10-24 MED ORDER — SULFAMETHOXAZOLE-TRIMETHOPRIM 800-160 MG PO TABS
1.0000 | ORAL_TABLET | Freq: Every day | ORAL | 11 refills | Status: DC
  Filled 2022-10-24: qty 30, 30d supply, fill #0
  Filled 2022-11-17 – 2022-11-19 (×3): qty 30, 30d supply, fill #1
  Filled 2022-12-19: qty 30, 30d supply, fill #2

## 2022-10-24 NOTE — Telephone Encounter (Addendum)
-----   Message from Radene Gunning, MD sent at 10/21/2022 12:36 PM EST ----- Patient had normal [place] for gyne stuff but has something involving her bladder. I recommend she follow up with urology asap. Please place referral and help arrange for pt.  Thanks! Pad  Called pt; VM left stating I am calling with results and new referral recommendation. [place] office notified to send referral to [facility]. Will attempt to contact a second time.

## 2022-10-24 NOTE — Telephone Encounter (Signed)
Done.  Durene Cal Bactrim DS 1 tablet PO daily. Per OI guidelines, secondary prophylaxis can be 1 DS BID or 1 DS daily. Since she already has issues with adherence, elected to send the daily prescription instead of BID.  Avalynn Bowe L. Cailah Reach, PharmD [facility] Clinical Pharmacist Practitioner

## 2022-10-27 ENCOUNTER — Other Ambulatory Visit (HOSPITAL_COMMUNITY): Payer: Self-pay

## 2022-10-28 NOTE — Telephone Encounter (Signed)
Left message that we are trying to call her please give [place] a return call back.  MyChart message sent.    Rayn Shorb,RN  10/28/22

## 2022-11-04 ENCOUNTER — Telehealth: Payer: Self-pay

## 2022-11-04 ENCOUNTER — Ambulatory Visit: Payer: Medicare Other

## 2022-11-04 ENCOUNTER — Other Ambulatory Visit: Payer: Self-pay

## 2022-11-04 NOTE — Telephone Encounter (Signed)
Patient called to report she has been having headaches since last week. Patient is having trouble sleeping at night and can't stay in a sitting position too long without having to lay back down. Took oxycodone - didn't provide any relief. I advised patient I would send a message back to Dr.Snider but we don't have any available appointments. Patient has appointment with PCP next week. I advised patient she may need to go to urgent care to be evaluated so she doesn't have to wait that long. Patient voiced her understanding.   [place], CMA

## 2022-11-05 ENCOUNTER — Emergency Department (HOSPITAL_COMMUNITY): Payer: Medicare Other

## 2022-11-05 ENCOUNTER — Emergency Department (HOSPITAL_COMMUNITY)
Admission: EM | Admit: 2022-11-05 | Discharge: 2022-11-05 | Disposition: A | Discharge status: Home / Self Care | Payer: Medicare Other | Attending: Student | Admitting: Student

## 2022-11-05 DIAGNOSIS — I10 Essential (primary) hypertension: Secondary | ICD-10-CM | POA: Diagnosis not present

## 2022-11-05 DIAGNOSIS — Z96643 Presence of artificial hip joint, bilateral: Secondary | ICD-10-CM | POA: Diagnosis not present

## 2022-11-05 DIAGNOSIS — R519 Headache, unspecified: Secondary | ICD-10-CM | POA: Diagnosis present

## 2022-11-05 DIAGNOSIS — D72819 Decreased white blood cell count, unspecified: Secondary | ICD-10-CM | POA: Diagnosis not present

## 2022-11-05 DIAGNOSIS — Z79899 Other long term (current) drug therapy: Secondary | ICD-10-CM | POA: Diagnosis not present

## 2022-11-05 DIAGNOSIS — Z21 Asymptomatic human immunodeficiency virus [HIV] infection status: Secondary | ICD-10-CM | POA: Insufficient documentation

## 2022-11-05 LAB — CSF CELL COUNT WITH DIFFERENTIAL
RBC Count, CSF: 5 /mm3 — ABNORMAL HIGH
RBC Count, CSF: 7 /mm3 — ABNORMAL HIGH
Tube #: 1
Tube #: 4
WBC, CSF: 0 /mm3 (ref 0–5)
WBC, CSF: 3 /mm3 (ref 0–5)

## 2022-11-05 LAB — CBC WITH DIFFERENTIAL/PLATELET
Abs Immature Granulocytes: 0.01 10*3/uL (ref 0.00–0.07)
Basophils Absolute: 0 10*3/uL (ref 0.0–0.1)
Basophils Relative: 1 %
Eosinophils Absolute: 0.1 10*3/uL (ref 0.0–0.5)
Eosinophils Relative: 2 %
HCT: 34.7 % — ABNORMAL LOW (ref 36.0–46.0)
Hemoglobin: 11.6 g/dL — ABNORMAL LOW (ref 12.0–15.0)
Immature Granulocytes: 0 %
Lymphocytes Relative: 30 %
Lymphs Abs: 1 10*3/uL (ref 0.7–4.0)
MCH: 30.8 pg (ref 26.0–34.0)
MCHC: 33.4 g/dL (ref 30.0–36.0)
MCV: 92 fL (ref 80.0–100.0)
Monocytes Absolute: 0.4 10*3/uL (ref 0.1–1.0)
Monocytes Relative: 13 %
Neutro Abs: 1.9 10*3/uL (ref 1.7–7.7)
Neutrophils Relative %: 54 %
Platelets: 230 10*3/uL (ref 150–400)
RBC: 3.77 MIL/uL — ABNORMAL LOW (ref 3.87–5.11)
RDW: 15.2 % (ref 11.5–15.5)
WBC: 3.5 10*3/uL — ABNORMAL LOW (ref 4.0–10.5)
nRBC: 0 % (ref 0.0–0.2)

## 2022-11-05 LAB — COMPREHENSIVE METABOLIC PANEL
ALT: 22 U/L (ref 0–44)
AST: 24 U/L (ref 15–41)
Albumin: 3.9 g/dL (ref 3.5–5.0)
Alkaline Phosphatase: 87 U/L (ref 38–126)
Anion gap: 10 (ref 5–15)
BUN: 6 mg/dL (ref 6–20)
CO2: 25 mmol/L (ref 22–32)
Calcium: 9.4 mg/dL (ref 8.9–10.3)
Chloride: 105 mmol/L (ref 98–111)
Creatinine, Ser: 0.81 mg/dL (ref 0.44–1.00)
GFR, Estimated: >60 mL/min (ref >60)
Glucose, Bld: 88 mg/dL (ref 70–99)
Potassium: 3.8 mmol/L (ref 3.5–5.1)
Sodium: 140 mmol/L (ref 135–145)
Total Bilirubin: 0.5 mg/dL (ref 0.3–1.2)
Total Protein: 7.8 g/dL (ref 6.5–8.1)

## 2022-11-05 LAB — PROTEIN, CSF: Total  Protein, CSF: 20 mg/dL (ref 15–45)

## 2022-11-05 LAB — CRYPTOCOCCAL ANTIGEN, CSF: Crypto Ag: NEGATIVE

## 2022-11-05 LAB — GLUCOSE, CSF: Glucose, CSF: 56 mg/dL (ref 40–70)

## 2022-11-05 LAB — I-STAT BETA HCG BLOOD, ED (MC, WL, AP ONLY): I-stat hCG, quantitative: <5 m[IU]/mL (ref <5)

## 2022-11-05 MED ORDER — OXYCODONE-ACETAMINOPHEN 5-325 MG PO TABS
1.0000 | ORAL_TABLET | Freq: Once | ORAL | Status: AC
  Administered 2022-11-05: 1 via ORAL
  Filled 2022-11-05: qty 1

## 2022-11-05 MED ORDER — LIDOCAINE HCL (PF) 1 % IJ SOLN
2.5000 mL | Freq: Once | INTRAMUSCULAR | Status: AC
  Administered 2022-11-05: 2.5 mL via INTRADERMAL

## 2022-11-05 MED ORDER — GADOBUTROL 1 MMOL/ML IV SOLN
8.0000 mL | Freq: Once | INTRAVENOUS | Status: AC | PRN
  Administered 2022-11-05: 8 mL via INTRAVENOUS

## 2022-11-05 MED ORDER — AMITRIPTYLINE HCL 10 MG PO TABS: 10.0000 mg | ORAL_TABLET | Freq: Every evening | ORAL | 0 refills | Status: AC | PRN

## 2022-11-05 NOTE — ED Notes (Signed)
Patient verbalizes understanding of discharge instructions. Opportunity for questioning and answers were provided. Armband removed by staff, pt discharged from ED. Ambulated out to lobby  

## 2022-11-05 NOTE — ED Notes (Signed)
Pt transported to radiology for LP.

## 2022-11-05 NOTE — ED Triage Notes (Signed)
Patient here with complaint of persistent headache with photophobia starting eight days ago, history of brain cancer. Patient states she called her physician who advised her to go to ED for imaging as she hasn't had any since April this year. Patient is alert, oriented, and in no apparent distress at this time.

## 2022-11-05 NOTE — ED Provider Triage Note (Signed)
Emergency Medicine Provider Triage Evaluation Note  Kelsey Rodriguez , a 31 y.o. female  was evaluated in triage.  Pt complains of headache, nausea, discomfort.  Review of Systems  Positive: History of brain tumor, no photophobia, nausea, vomiting Negative: Extremity weakness, confusion, syncope  Physical Exam  BP (!) 141/92   Pulse 76   Temp 98.7 F (37.1 C)   Resp 16   LMP 09/29/2022 (Within Days)   SpO2 98%  Gen:   Awake, no distress speaking clearly Resp:  Normal effort no increased work of breathing MSK:   Moves extremities without difficulty  Other:  Neuro: Speech clear face symmetric, moving all extremities to command  Medical Decision Making  Medically screening exam initiated at 8:50 AM.  Appropriate orders placed.  Kelsey Rodriguez was informed that the remainder of the evaluation will be completed by another provider, this initial triage assessment does not replace that evaluation, and the importance of remaining in the ED until their evaluation is complete.     Carmin Muskrat, MD 11/05/22 [phone]

## 2022-11-05 NOTE — ED Notes (Signed)
MRI came up to this tech and stated that patient had itching after contrast at the iv site. Patient stated this is normal. This tech told triage nurse

## 2022-11-06 NOTE — ED Provider Notes (Signed)
[facility] EMERGENCY DEPARTMENT Provider Note  CSN: [id number] Arrival date & time: 11/05/22 2423  Chief Complaint(s) Headache  HPI Kelsey Rodriguez is a 31 y.o. 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  Patient states that since discharge from the hospital in April her headaches have been under control but over the last 8 days she has had worsening headaches, worse when sitting up for long periods of time or lying flat and involving the neck.  Denies numbness, tingling, weakness or other neurologic complaints.  Patient had an extended stay in the waiting room lobby and upon my evaluation in the emergency department she states her headache has mostly improved and she just wants some medicine for headache before bed.  She states that she is broken through her oxycodone and she spoke with her infectious disease provider who asked her to come to the emergency department for imaging.   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   Acute right-sided low back pain with right-sided sciatica 08/24/2017   Adrenal insufficiency (HCC)    Anemia of chronic disease 03/11/2012   ASCUS with positive high risk HPV cervical 09/14/2017   Avascular necrosis of bone of left hip ([place]) 12/14/2015   Avascular necrosis of bone of right hip ([place]) 04/04/2016   Back pain of lumbar region with sciatica 02/12/2015   Bell's palsy 08/26/2013   Brain lesion    Brain tumor ([place]) 05/16/2020   Bullae 05/30/2012   Cerebral edema ([place]) 10/28/2019   Cervical dysplasia, mild 12/06/2020   Chronic back pain    Chronic leg pain    bilateral knees, ankles   Chronic pelvic pain in female 01/04/2019   Complex regional pain syndrome 02/03/2017   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[place])    Depression    Eczema 02/28/2022   Encephalitis, myelitis, 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[place]) 01/31/2020   Fatigue    GERD (gastroesophageal reflux disease)    Headache    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   HIV (human immunodeficiency virus infection) ([place]) 02/2012   Hypertension    Laceration of ankle, right 11/18/2012   Lower abdominal pain 06/21/2018   Lumbar radiculopathy    Meningitis 02/18/2018   Pain of upper abdomen    Pelvic pain    PID (acute pelvic inflammatory disease) 02/26/2018   Pneumonia    Reflux esophagitis 03/11/2012   Rotator cuff strain 01/26/2020   S/P craniotomy 05/16/2020   Seizure ([place])    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   Suicide ideation    Syphilis 02/26/2018   Tendinopathy of left shoulder 01/18/2019   TOA (tubo-ovarian abscess) 04/15/2021   Tuberculosis    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   Tubo-ovarian abscess 04/25/2021   Vertigo    Wears glasses    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[place])    Seizure ([place])    Toxoplasmosis 11/07/2019   AIDS (acquired immune deficiency syndrome) ([place]) 11/07/2019   Chronic pelvic pain in female 01/04/2019  Tuberculosis    Complex regional pain syndrome 02/03/2017   Primary adrenal insufficiency ([place]) 01/03/2015   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 Authorizing Provider  amitriptyline (ELAVIL) 10 MG tablet Take 1 tablet (10 mg total) by mouth at bedtime as needed for sleep (headaches). 11/05/22 12/05/22 Yes Braeton Wolgamott, MD  acetaminophen (TYLENOL) 500 MG tablet Take  500 mg by mouth every 6 (six) hours as needed for mild pain or headache.    [provider]  bictegravir-emtricitabine-tenofovir AF (BIKTARVY) 50-200-25 MG TABS tablet TAKE 1 TABLET BY MOUTH DAILY. 04/18/22 04/18/23  Campbell Riches, MD  diphenhydrAMINE HCl (BENADRYL ALLERGY PO) Take by mouth.    [provider]  ibuprofen (ADVIL) 800 MG tablet Take 800 mg by mouth 2 (two) times daily as needed. 05/05/22   [provider]  levETIRAcetam (KEPPRA) 500 MG tablet TAKE 1 TABLET(500 MG) BY MOUTH TWICE DAILY Patient not taking: Reported on 10/13/2022 06/19/22   Carlyle Basques, MD  oxyCODONE (ROXICODONE) 5 MG immediate release tablet Take 0.5-1 tablets (2.5-5 mg total) by mouth every 6 (six) hours as needed for severe pain. 05/18/22   Margarita Mail, PA-C  Polyethylene Glycol 3350 (MIRALAX PO) Take by mouth.    [provider]  sulfamethoxazole-trimethoprim (BACTRIM DS) 800-160 MG tablet Take 1 tablet by mouth daily. 10/24/22 10/24/23  Kuppelweiser, Cassie L, RPH-CPP  tranexamic acid (LYSTEDA) 650 MG TABS tablet Take 2 tablets (1,300 mg total) by mouth 3 (three) times daily. Take during menses for a maximum of five days 10/13/22   Radene Gunning, MD  SUMAtriptan (IMITREX) 50 MG tablet Take 1 tablet (50 mg total) by mouth every 2 (two) hours as needed for migraine (Maximum dose: 100 mg per dose; 200 mg per 24 hours). May repeat in 2 hours if headache persists or recurs. 10/26/19 01/31/20  Caroline More, DO                                                                                                                                    Past Surgical History Past Surgical History:  Procedure Laterality Date   APPENDECTOMY  ~ 2706   APPLICATION OF CRANIAL NAVIGATION N/A 05/16/2020   Procedure: APPLICATION OF CRANIAL NAVIGATION;  Surgeon: Newman Pies, MD;  Location: [facility];  Service: Neurosurgery;  Laterality: N/A;   CRANIOTOMY Right 05/16/2020   Procedure: Craniotomy for  Resection of Lesion;  Surgeon: Newman Pies, MD;  Location: [facility];  Service: Neurosurgery;  Laterality: Right;  right   DILATION AND CURETTAGE OF UTERUS  12/15/2006   ESOPHAGOGASTRODUODENOSCOPY  03/11/2012   Procedure: ESOPHAGOGASTRODUODENOSCOPY (EGD);  Surgeon: Lafayette Dragon, MD;  Location[facility] END[facility];  Service: Endoscopy;  Laterality: N/A;   ESOPHAGOGASTRODUODENOSCOPY N/A 03/07/2014   Procedure: ESOPHAGOGASTRODUODENOSCOPY (EGD);  Surgeon: Gatha Mayer, MD;  Location: [facility] ENDOSCOPY;  Service: Endoscopy;  Laterality: N/A;   LAPAROSCOPIC OOPHERECTOMY  05/2021   [facility] - LSO and right salpingectomy   LUNG BIOPSY  02/13/2012   TOTAL HIP ARTHROPLASTY Left 12/14/2015   Procedure: LEFT TOTAL HIP ARTHROPLASTY ANTERIOR APPROACH;  Surgeon: Mcarthur Rossetti, MD;  Location: [facility];  Service: Orthopedics;  Laterality: Left;   TOTAL HIP ARTHROPLASTY Right 04/04/2016   Procedure: RIGHT TOTAL HIP ARTHROPLASTY ANTERIOR APPROACH;  Surgeon: Mcarthur Rossetti, MD;  Location: [facility];  Service: Orthopedics;  Laterality: Right;   Family History Family History  Problem Relation Age of Onset   Heart disease Father        Vague not clearly cardiac   Hypertension Mother     Social History Social History   Tobacco Use   Smoking status: Never   Smokeless tobacco: Never  Vaping Use   Vaping Use: Never used  Substance Use Topics   Alcohol use: Not Currently    Comment: socially   Drug use: No   Allergies Hydrocodone and Tramadol  Review of Systems Review of Systems  Neurological:  Positive for headaches.    Physical Exam Vital Signs  I have reviewed the triage vital signs BP (!) 125/96   Pulse 71   Temp 98.1 F (36.7 C)   Resp 16   LMP 09/29/2022 (Within Days) Comment: neg preg test  SpO2 100%   Physical Exam Vitals and nursing note reviewed.  Constitutional:      General: She is not in acute distress.    Appearance: She is well-developed.  HENT:     Head: Normocephalic  and atraumatic.  Eyes:     Conjunctiva/sclera: Conjunctivae normal.  Cardiovascular:     Rate and Rhythm: Normal rate and regular rhythm.     Heart sounds: No murmur heard. Pulmonary:     Effort: Pulmonary effort is normal. No respiratory distress.     Breath sounds: Normal breath sounds.  Abdominal:     Palpations: Abdomen is soft.     Tenderness: There is no abdominal tenderness.  Musculoskeletal:        General: No swelling.     Cervical back: Neck supple.  Skin:    General: Skin is warm and dry.     Capillary Refill: Capillary refill takes less than 2 seconds.  Neurological:     Mental Status: She is alert.     Cranial Nerves: No cranial nerve deficit, dysarthria or facial asymmetry.     Sensory: No sensory deficit.     Motor: No weakness.  Psychiatric:        Mood and Affect: Mood normal.     ED Results and Treatments Labs (all labs ordered are listed, but only abnormal results are displayed) Labs Reviewed  CBC WITH DIFFERENTIAL/PLATELET - Abnormal; Notable for the following components:      Result Value   WBC 3.5 (*)    RBC 3.77 (*)    Hemoglobin 11.6 (*)    HCT 34.7 (*)    All other components within normal limits  CSF CELL COUNT WITH DIFFERENTIAL - Abnormal; Notable for the following components:   Appearance, CSF CLEAR (*)    RBC Count, CSF 7 (*)    All other components within normal limits  CSF CELL COUNT WITH DIFFERENTIAL - Abnormal; Notable for the following components:   Appearance, CSF CLEAR (*)    RBC Count, CSF 5 (*)    All other components within normal limits  CSF CULTURE W GRAM STAIN  COMPREHENSIVE METABOLIC  PANEL  GLUCOSE, CSF  PROTEIN, CSF  CRYPTOCOCCAL ANTIGEN, CSF  VDRL, CSF  I-STAT BETA HCG BLOOD, ED ([facility], [facility], [facility] ONLY)                                                                                                                          Radiology DG FL GUIDED LUMBAR PUNCTURE  Result Date: 11/05/2022 CLINICAL DATA:  HIV positive with  headache and suspicious for CNS toxoplasmosis. EXAM: DIAGNOSTIC LUMBAR PUNCTURE UNDER FLUOROSCOPIC GUIDANCE COMPARISON:  MRI brain, same date. FLUOROSCOPY: Radiation Exposure Index (as provided by the fluoroscopic device): 33.80 mGy PROCEDURE: Informed consent was obtained from the patient prior to the procedure, including potential complications of headache, allergy, and pain. With the patient prone, the lower back was prepped with Betadine. 1% Lidocaine was used for local anesthesia. Lumbar puncture was performed at the L3-4 level using a 20 gauge needle with return of clear CSF with an opening pressure of 17 cm water. 8.5 ml of CSF were obtained for laboratory studies. The patient tolerated the procedure well and there were no apparent complications. IMPRESSION: Fluoroscopic guided lumbar puncture with 8.5 cc of clear CSF obtained for appropriate laboratory evaluation. Electronically Signed   By: Marijo Sanes M.D.   On: 11/05/2022 19:53   MR Brain W and Wo Contrast  Result Date: 11/05/2022 CLINICAL DATA:  Mental status change, unknown cause Hx of CA, new Sx EXAM: MRI HEAD WITHOUT AND WITH CONTRAST TECHNIQUE: Multiplanar, multiecho pulse sequences of the brain and surrounding structures were obtained without and with intravenous contrast. CONTRAST:  2m GADAVIST GADOBUTROL 1 MMOL/ML IV SOLN COMPARISON:  MRI head may 26, 23. FINDINGS: Brain: Similar encephalomalacia in the right temporal lobe related to prior surgery. Previously noted areas of enhancement are decreased in size, now punctate. No new enhancing lesions identified. No evidence of acute infarct, midline shift, hydrocephalus or extra-axial fluid collection Vascular: Major arterial flow voids are maintained. Skull and upper cervical spine: Normal marrow signal. Sinuses/Orbits: Right frontal sinus mucosal thickening. No acute orbital findings. Other: No mastoid effusions. IMPRESSION: Previously noted areas of enhancement are decreased in size, now  punctate. No new enhancing lesions or evidence of acute abnormality. Electronically Signed   By: FMargaretha SheffieldM.D.   On: 11/05/2022 12:42    Pertinent labs & imaging results that were available during my care of the patient were reviewed by me and considered in my medical decision making (see MDM for details).  Medications Ordered in ED Medications  oxyCODONE-acetaminophen (PERCOCET/ROXICET) 5-325 MG per tablet 1 tablet (1 tablet Oral Given 11/05/22 0900)  gadobutrol (GADAVIST) 1 MMOL/ML injection 8 mL (8 mLs Intravenous Contrast Given 11/05/22 1143)  lidocaine (PF) (XYLOCAINE) 1 % injection 2.5 mL (2.5 mLs Intradermal Given 11/05/22 1857)  Procedures Procedures  (including critical care time)  Medical Decision Making / ED Course   This patient presents to the ED for concern of headaches, this involves an extensive number of treatment options, and is a complaint that carries with it a high risk of complications and morbidity.  The differential diagnosis includes tension headache, migraine headache, cryptococcal meningitis, CNS toxoplasmosis, brain abscess, neurosyphilis  MDM: Patient seen emergency room for evaluation of headaches.  Physical exam largely unremarkable with an unremarkable neuroexam.  Laboratory evaluation with a leukopenia to 3.5 which is baseline for this patient and hemoglobin 11.6 which is also baseline.  No significant leukocytosis.  Brain MRI with no acute pathology and previously described lesions are shrinking in size.  On my reevaluation, patient states that she would like to go home if possible but I do have fairly high concern based on her current history of AIDS and previous CNS toxoplasmosis for possible meningitis and thus I spoke with the infectious disease physician on-call who requested fluoro-guided LP with opening pressure to  rule it out.  I consulted radiology who graciously agreed to perform this procedure and CSF findings are reassuringly negative, opening pressure 17.  Cultures are pending and given that symptoms have resolved with reassuring lumbar puncture findings patient was discharged with outpatient infectious disease follow-up.  I did provide the patient a prescription for amitriptyline to try for her headaches and informed her of the risks and benefits of taking this medication.   Additional history obtained:  -External records from outside source obtained and reviewed including: Chart review including previous notes, labs, imaging, consultation notes   Lab Tests: -I ordered, reviewed, and interpreted labs.   The pertinent results include:   Labs Reviewed  CBC WITH DIFFERENTIAL/PLATELET - Abnormal; Notable for the following components:      Result Value   WBC 3.5 (*)    RBC 3.77 (*)    Hemoglobin 11.6 (*)    HCT 34.7 (*)    All other components within normal limits  CSF CELL COUNT WITH DIFFERENTIAL - Abnormal; Notable for the following components:   Appearance, CSF CLEAR (*)    RBC Count, CSF 7 (*)    All other components within normal limits  CSF CELL COUNT WITH DIFFERENTIAL - Abnormal; Notable for the following components:   Appearance, CSF CLEAR (*)    RBC Count, CSF 5 (*)    All other components within normal limits  CSF CULTURE W GRAM STAIN  COMPREHENSIVE METABOLIC PANEL  GLUCOSE, CSF  PROTEIN, CSF  CRYPTOCOCCAL ANTIGEN, CSF  VDRL, CSF  I-STAT BETA HCG BLOOD, ED ([facility], [facility], [facility] ONLY)      Imaging Studies ordered: I ordered imaging studies including MRI brain I independently visualized and interpreted imaging. I agree with the radiologist interpretation   Medicines ordered and prescription drug management: Meds ordered this encounter  Medications   oxyCODONE-acetaminophen (PERCOCET/ROXICET) 5-325 MG per tablet 1 tablet   gadobutrol (GADAVIST) 1 MMOL/ML injection 8 mL    lidocaine (PF) (XYLOCAINE) 1 % injection 2.5 mL   amitriptyline (ELAVIL) 10 MG tablet    Sig: Take 1 tablet (10 mg total) by mouth at bedtime as needed for sleep (headaches).    Dispense:  30 tablet    Refill:  0    -I have reviewed the patients home medicines and have made adjustments as needed  Critical interventions none  Consultations Obtained: I requested consultation with the diagnostic radiologist on-call and infectious disease physician on-call,  and discussed  lab and imaging findings as well as pertinent plan - they recommend: Fluoroscopy guided LP   Cardiac Monitoring: The patient was maintained on a cardiac monitor.  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   Acute right-sided low back pain with right-sided sciatica 08/24/2017   Adrenal insufficiency ([place])    Anemia of chronic disease 03/11/2012   ASCUS with positive high risk HPV cervical 09/14/2017   Avascular necrosis of bone of left hip ([place]) 12/14/2015   Avascular necrosis of bone of right hip ([place]) 04/04/2016   Back pain of lumbar region with sciatica 02/12/2015   Bell's palsy 08/26/2013   Brain lesion    Brain tumor ([place]) 05/16/2020   Bullae 05/30/2012   Cerebral edema ([place]) 10/28/2019   Cervical dysplasia, mild 12/06/2020   Chronic back pain    Chronic leg pain    bilateral knees, ankles   Chronic pelvic pain in female 01/04/2019   Complex regional pain syndrome 02/03/2017   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[place])    Depression    Eczema 02/28/2022   Encephalitis, myelitis, and  encephalomyelitis ([place]) 01/31/2020   Fatigue    GERD (gastroesophageal reflux disease)    Headache    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   HIV (human immunodeficiency virus infection) ([place]) 02/2012   Hypertension    Laceration of ankle, right 11/18/2012   Lower abdominal pain 06/21/2018   Lumbar radiculopathy    Meningitis 02/18/2018   Pain of upper abdomen    Pelvic pain    PID (acute pelvic inflammatory disease) 02/26/2018   Pneumonia    Reflux esophagitis 03/11/2012   Rotator cuff strain 01/26/2020   S/P craniotomy 05/16/2020   Seizure ([place])    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   Suicide ideation    Syphilis 02/26/2018   Tendinopathy of left shoulder 01/18/2019   TOA (tubo-ovarian abscess) 04/15/2021   Tuberculosis    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

## 2022-11-07 LAB — VDRL, CSF: VDRL Quant, CSF: NONREACTIVE

## 2022-11-09 LAB — CSF CULTURE W GRAM STAIN: Culture: NO GROWTH

## 2022-11-11 ENCOUNTER — Ambulatory Visit: Payer: Medicare Other

## 2022-11-11 NOTE — Progress Notes (Unsigned)
Therapist met with client as scheduled and discussed their progress. Therapist used active listening skills to provide client with opportunity to disclose previous challenges and successes since last session. Specific problem-solving skills were processed with client, including breaking down problems, brainstorming, evaluating, and choosing options. At times implementing a plan and evaluating and reevaluating results. Therapist assessed for SI/HI during session and will follow-up with client during the next session.   Client met with therapist as scheduled and presented alert; orient X4. At the start of session, client affect appeared euthymic; affect appeared to be congruent with client report. Client struggled making / appeared to make connections between consequences, challenges and alternative thoughts of mental health recovery during this session. Client expressed their needs in the development of their treatment goals and described different communities of which can serve as an added support. Client expressed concerns/issues in the following areas of their life regarding her family members. Client reported that her she and her sister got into an argument over how her family was being treated by her sister's husband. Client reported her sister began spreading rumors about her diagnosis to people in retaliation. Client also reports that there was only two people that she told about her diagnosis and her sister was not one of them. Client progress toward therapeutic goal(s) remain minimal / moderate/ optimal at this time. Client denied SI/HI.

## 2022-11-12 ENCOUNTER — Telehealth: Payer: Self-pay

## 2022-11-12 DIAGNOSIS — N3289 Other specified disorders of bladder: Secondary | ICD-10-CM

## 2022-11-12 DIAGNOSIS — Z906 Acquired absence of other parts of urinary tract: Secondary | ICD-10-CM

## 2022-11-12 NOTE — Telephone Encounter (Signed)
Per MyChart message, pt agrees to urology referral   Fax: [phone]  Spoke with [facility] front office and was told that they do accept patient's insurance.   MyChart message sent by Mel Almond RN for patient to follow up with [facility] in regards to appointment and to contact office if she has any questions.    Adonis Huguenin RN  11/12/22 at [phone]

## 2022-11-17 ENCOUNTER — Other Ambulatory Visit (HOSPITAL_COMMUNITY): Payer: Self-pay

## 2022-11-18 ENCOUNTER — Other Ambulatory Visit (HOSPITAL_COMMUNITY): Payer: Self-pay

## 2022-11-19 ENCOUNTER — Other Ambulatory Visit (HOSPITAL_COMMUNITY): Payer: Self-pay

## 2022-11-20 ENCOUNTER — Other Ambulatory Visit (HOSPITAL_COMMUNITY): Payer: Self-pay

## 2022-11-27 ENCOUNTER — Encounter: Payer: Self-pay | Admitting: Internal Medicine

## 2022-11-27 ENCOUNTER — Other Ambulatory Visit (HOSPITAL_COMMUNITY): Payer: Self-pay

## 2022-11-27 ENCOUNTER — Ambulatory Visit (INDEPENDENT_AMBULATORY_CARE_PROVIDER_SITE_OTHER): Payer: Medicare Other | Admitting: Internal Medicine

## 2022-11-27 ENCOUNTER — Other Ambulatory Visit: Payer: Self-pay

## 2022-11-27 VITALS — BP 122/86 | HR 88 | Temp 97.7°F | Ht 64.0 in | Wt 180.0 lb

## 2022-11-27 DIAGNOSIS — B20 Human immunodeficiency virus [HIV] disease: Secondary | ICD-10-CM

## 2022-11-27 DIAGNOSIS — G43C0 Periodic headache syndromes in child or adult, not intractable: Secondary | ICD-10-CM | POA: Diagnosis not present

## 2022-11-27 DIAGNOSIS — B582 Toxoplasma meningoencephalitis: Secondary | ICD-10-CM

## 2022-11-27 DIAGNOSIS — Z9189 Other specified personal risk factors, not elsewhere classified: Secondary | ICD-10-CM

## 2022-11-27 MED ORDER — ATOVAQUONE-PROGUANIL HCL 250-100 MG PO TABS
1.0000 | ORAL_TABLET | Freq: Every day | ORAL | 0 refills | Status: DC
  Filled 2022-11-27: qty 28, 28d supply, fill #0

## 2022-11-27 MED ORDER — METHOCARBAMOL 500 MG PO TABS
500.0000 mg | ORAL_TABLET | Freq: Four times a day (QID) | ORAL | 0 refills | Status: DC
  Filled 2022-11-27: qty 30, 8d supply, fill #0

## 2022-11-27 MED ORDER — SUMATRIPTAN SUCCINATE 50 MG PO TABS
50.0000 mg | ORAL_TABLET | ORAL | 0 refills | Status: DC | PRN
  Filled 2022-11-27: qty 10, 25d supply, fill #0

## 2022-11-27 NOTE — Progress Notes (Signed)
Patient ID: Kelsey Rodriguez, female   DOB: February 21, 1991, 31 y.o.   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 Hx of cns toxo. She states she has been taking meds more routinely. Did have 2 weeks worth of headache Having more migraines. Went to ED for eval that rule out meningitis. She think it was stress induced.  She is back on biktary and bactrim. Now taking elavil at night as well.   Thinking about going to [place] to see her husband's family in December  Otherwise no other health issues   Outpatient Encounter Medications as of 11/27/2022  Medication Sig   acetaminophen (TYLENOL) 500 MG tablet Take 500 mg by mouth every 6 (six) hours as needed for mild pain or headache.   amitriptyline (ELAVIL) 10 MG tablet Take 1 tablet (10 mg total) by mouth at bedtime as needed for sleep (headaches).   bictegravir-emtricitabine-tenofovir AF (BIKTARVY) 50-200-25 MG TABS tablet TAKE 1 TABLET BY MOUTH DAILY.   diphenhydrAMINE HCl (BENADRYL ALLERGY PO) Take by mouth.   ibuprofen (ADVIL) 800 MG tablet Take 800 mg by mouth 2 (two) times daily as needed.   Polyethylene Glycol 3350 (MIRALAX PO) Take by mouth.   sulfamethoxazole-trimethoprim (BACTRIM DS) 800-160 MG tablet Take 1 tablet by mouth daily.   tranexamic acid (LYSTEDA) 650 MG TABS tablet Take 2 tablets (1,300 mg total) by mouth 3 (three) times daily. Take during menses for a maximum of five days   levETIRAcetam (KEPPRA) 500 MG tablet TAKE 1 TABLET(500 MG) BY MOUTH TWICE DAILY (Patient not taking: Reported on 10/13/2022)   oxyCODONE (ROXICODONE) 5 MG immediate release tablet Take 0.5-1 tablets (2.5-5 mg total) by mouth every 6 (six) hours as needed for severe pain. (Patient not taking: Reported on 11/27/2022)   [DISCONTINUED] SUMAtriptan (IMITREX) 50 MG tablet Take 1 tablet (50 mg total) by mouth every 2 (two) hours as needed for migraine (Maximum dose: 100 mg per dose; 200 mg per 24 hours). May repeat in 2 hours if  headache persists or recurs.   No facility-administered encounter medications on file as of 11/27/2022.     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     Health Maintenance Due  Topic Date Due   Medicare Annual Wellness (AWV)  Never done   HPV VACCINES (2 - Risk 3-dose SCDM series) 09/06/2018   COVID-19 Vaccine (3 - Pfizer risk series) 06/27/2020   INFLUENZA VACCINE  07/15/2022   DTaP/Tdap/Td (2 - Td or Tdap) 11/13/2022     Review of Systems 12 point ros is negative except for headache Physical Exam   BP 122/86   Pulse 88   Temp 97.7 F (36.5 C) (Temporal)   Ht '5\' 4"'$  (1.626 m)   Wt 180 lb (81.6 kg)   LMP 09/29/2022 (Within Days) Comment: neg preg test  SpO2 100%   BMI 30.90 kg/m   Physical Exam  Constitutional:  oriented to person, place, and time. appears well-developed  and well-nourished. No distress.  HENT: /AT, PERRLA, no scleral icterus Mouth/Throat: Oropharynx is clear and moist. No oropharyngeal exudate.  Cardiovascular: Normal rate, regular rhythm and normal heart sounds. Exam reveals no gallop and no friction rub.  No murmur heard.  Pulmonary/Chest: Effort normal and breath sounds normal. No respiratory distress.  has no wheezes.  Neck = supple, no nuchal rigidity Abdominal: Soft. Bowel sounds are normal.  exhibits no distension. There is no tenderness.  Lymphadenopathy: no cervical adenopathy. No axillary adenopathy Neurological: alert  and oriented to person, place, and time.  Skin: Skin is warm and dry. No rash noted. No erythema.  Psychiatric: a normal mood and affect.  behavior is normal.   Lab Results  Component Value Date   CD4TCELL 6 (L) 08/12/2022   Lab Results  Component Value Date   CD4TABS 55 (L) 08/12/2022   CD4TABS 170 (L) 01/02/2022   CD4TABS 51 (L) 08/27/2021   Lab Results  Component Value Date   HIV1RNAQUANT 244,000 (H) 08/12/2022   Lab Results  Component Value Date   HEPBSAB POS (A) 09/13/2015   Lab Results  Component Value Date   LABRPR REACTIVE (A) 08/12/2022    CBC Lab Results  Component Value Date   WBC 3.5 (L) 11/05/2022   RBC 3.77 (L) 11/05/2022   HGB 11.6 (L) 11/05/2022   HCT 34.7 (L) 11/05/2022   PLT 230 11/05/2022   MCV 92.0 11/05/2022   MCH 30.8 11/05/2022   MCHC 33.4 11/05/2022   RDW 15.2 11/05/2022   LYMPHSABS 1.0 11/05/2022   MONOABS 0.4 11/05/2022   EOSABS 0.1 11/05/2022    BMET Lab Results  Component Value Date   NA 140 11/05/2022   K 3.8 11/05/2022   CL 105 11/05/2022   CO2 25 11/05/2022   GLUCOSE 88 11/05/2022   BUN 6 11/05/2022   CREATININE 0.81 11/05/2022   CALCIUM 9.4 11/05/2022   GFRNONAA >60 11/05/2022   GFRAA >60 08/30/2020      Assessment and Plan Hiv disease = will do labs to see if she has better virologic control. Continue to advocate for her to do bitkarvy  Cns toxo = continue on bactrim proph  Headache management = can a trial of imitrex with severe headache and gave instructions how to take meds  Msk pain = will do robaxin when she has neck pain with headache  Upcoming trip to [place] = malaria prophylaxis--going for 5 days

## 2022-11-28 ENCOUNTER — Other Ambulatory Visit (HOSPITAL_COMMUNITY): Payer: Self-pay

## 2022-11-28 ENCOUNTER — Other Ambulatory Visit: Payer: Self-pay

## 2022-11-28 LAB — T-HELPER CELL (CD4) - (RCID CLINIC ONLY)
CD4 % Helper T Cell: 9 % — ABNORMAL LOW (ref 33–65)
CD4 T Cell Abs: 115 /uL — ABNORMAL LOW (ref 400–1790)

## 2022-12-01 LAB — HIV-1 RNA QUANT-NO REFLEX-BLD
HIV 1 RNA Quant: 519 Copies/mL — ABNORMAL HIGH
HIV-1 RNA Quant, Log: 2.72 Log cps/mL — ABNORMAL HIGH

## 2022-12-01 LAB — RPR: RPR Ser Ql: REACTIVE — AB

## 2022-12-01 LAB — T PALLIDUM AB: T Pallidum Abs: NEGATIVE

## 2022-12-01 LAB — RPR TITER: RPR Titer: 1:1 {titer} — ABNORMAL HIGH

## 2022-12-16 ENCOUNTER — Other Ambulatory Visit (HOSPITAL_COMMUNITY): Payer: Self-pay

## 2022-12-19 ENCOUNTER — Other Ambulatory Visit (HOSPITAL_COMMUNITY): Payer: Self-pay

## 2022-12-22 ENCOUNTER — Other Ambulatory Visit (HOSPITAL_COMMUNITY): Payer: Self-pay

## 2022-12-30 ENCOUNTER — Other Ambulatory Visit: Payer: Self-pay | Admitting: Urology

## 2023-01-01 NOTE — Progress Notes (Addendum)
COVID Vaccine received:  _0  No _1  Yes Date of any COVID positive Test in last 90 days: None  PCP - Lowry Ram, MD   Cardiologist - None ID- Carlyle Basques, MD Neurosurgery- Newman Pies, MD  Chest x-ray -04-11-2022  1v  Epic  EKG -  04-14-2022  Epic Stress Test -  ECHO - 02-23-2020  Epic Cardiac Cath -   PCR screen: _2  Ordered & Completed                      _3   No Order but Needs PROFEND                      _4   N/A for this surgery  Surgery Plan:  _5  Ambulatory                            _6  Outpatient in bed                            _7  Admit  Anesthesia:    _8  General  _9  Spinal                           _10   Choice _11   MAC  Bowel Prep - _12  No  _13   Yes _____________  Pacemaker / ICD device _14  No _15  Yes        Device order form faxed _16  No    _17   Yes      Faxed to:  Spinal Cord Stimulator:_18  No _19  Yes      (Remind patient to bring remote DOS) Other Implants:   History of Sleep Apnea? _20  No _21  Yes  Sleep study 2018 was negative CPAP used?- _22  No _23  Yes    Patient has: _24  Pre-DM   _25  DM1  _26   DM2 Does the patient monitor blood sugar? _27  No _28  Yes  _29  N/A  Blood Thinner / Instructions:none Aspirin Instructions: none  ERAS Protocol Ordered: _30  No  _31  Yes  Patient is to be NPO after: Midnight prior  Comments:   Activity level: Patient can climb a flight of stairs without difficulty; _32  No CP  _33  No SOB  Patient can perform ADLs without assistance.   Anesthesia review: HTN, AIDs, S/p craniotomy for lesion 05-16-2020, seizures (last 03-2022), CNS toxsoplasmosis, Tuberculosis, anemia, s/p mediastinotomy Marye Round- Left 03-16-2012  Patient denies shortness of breath, fever, cough and chest pain at PAT appointment.  Patient verbalized understanding and agreement to the Pre-Surgical Instructions that were given to them at this PAT appointment. Patient was also educated of the need to review these PAT instructions again prior to his/her  surgery.I reviewed the appropriate phone numbers to call if they have any and questions or concerns.

## 2023-01-01 NOTE — Patient Instructions (Addendum)
SURGICAL WAITING ROOM VISITATION Patients having surgery or a procedure may have no more than 2 support people in the waiting area - these visitors may rotate in the visitor waiting room.   Due to an increase in RSV and influenza rates and associated hospitalizations, children ages 24 and under may not visit patients in [place]. If the patient needs to stay at the hospital during part of their recovery, the visitor guidelines for inpatient rooms apply.  PRE-OP VISITATION  Pre-op nurse will coordinate an appropriate time for 1 support person to accompany the patient in pre-op.  This support person may not rotate.  This visitor will be contacted when the time is appropriate for the visitor to come back in the pre-op area.  Please refer to the [facility] website for the visitor guidelines for Inpatients (after your surgery is over and you are in a regular room).  You are not required to quarantine at this time prior to your surgery. However, you must do this: Hand Hygiene often Do NOT share personal items Notify your provider if you are in close contact with someone who has COVID or you develop fever 100.4 or greater, new onset of sneezing, cough, sore throat, shortness of breath or body aches.  If you test positive for Covid or have been in contact with anyone that has tested positive in the last 10 days please notify you surgeon.    Your procedure is scheduled on:  Monday  January 05, 2023  Report to [facility] Main Entrance: [place] entrance where the [employer] is available.   Report to admitting at: 10:45 AM  +++++Call this number if you have any questions or problems the morning of surgery [phone]  DO NOT EAT OR DRINK ANYTHING AFTER MIDNIGHT THE NIGHT PRIOR TO YOUR SURGERY / PROCEDURE.   FOLLOW BOWEL PREP AND ANY ADDITIONAL PRE OP INSTRUCTIONS YOU RECEIVED FROM YOUR SURGEON'S OFFICE!!!   Oral Hygiene is also important to reduce your risk of infection.         Remember - BRUSH YOUR TEETH THE MORNING OF SURGERY WITH YOUR REGULAR TOOTHPASTE  Do NOT smoke after Midnight the night before surgery.  Take ONLY these medicines the morning of surgery with A SIP OF WATER: [place],     You may take Tylenol or Percocet if needed for pain. You may use your Isopto Tears eye drops.                  You may not have any metal on your body including hair pins, jewelry, and body piercing  Do not wear make-up, lotions, powders, perfumes or deodorant  Do not wear nail polish including gel and S&S, artificial / acrylic nails, or any other type of covering on natural nails including finger and toenails. If you have artificial nails, gel coating, etc., that needs to be removed by a nail salon, Please have this removed prior to surgery. Not doing so may mean that your surgery could be cancelled or delayed if the Surgeon or anesthesia staff feels like they are unable to monitor you safely.   Do not shave 48 hours prior to surgery to avoid nicks in your skin which may contribute to postoperative infections.   Patients discharged on the day of surgery will not be allowed to drive home.  Someone NEEDS to stay with you for the first 24 hours after anesthesia.  Do not bring your home medications to the hospital. The Pharmacy will dispense  medications listed on your medication list to you during your admission in the Hospital.  Please read over the following fact sheets you were given: IF YOU HAVE QUESTIONS ABOUT YOUR PRE-OP INSTRUCTIONS, PLEASE CALL [phone]  ([place])   [place] - Preparing for Surgery Before surgery, you can play an important role.  Because skin is not sterile, your skin needs to be as free of germs as possible.  You can reduce the number of germs on your skin by washing with CHG (chlorahexidine gluconate) soap before surgery.  CHG is an antiseptic cleaner which kills germs and bonds with the skin to continue killing germs even after  washing. Please DO NOT use if you have an allergy to CHG or antibacterial soaps.  If your skin becomes reddened/irritated stop using the CHG and inform your nurse when you arrive at Short Stay. Do not shave (including legs and underarms) for at least 48 hours prior to the first CHG shower.  You may shave your face/neck.  Please follow these instructions carefully:  1.  Shower with CHG Soap the night before surgery and the  morning of surgery.  2.  If you choose to wash your hair, wash your hair first as usual with your normal  shampoo.  3.  After you shampoo, rinse your hair and body thoroughly to remove the shampoo.                             4.  Use CHG as you would any other liquid soap.  You can apply chg directly to the skin and wash.  Gently with a scrungie or clean washcloth.  5.  Apply the CHG Soap to your body ONLY FROM THE NECK DOWN.   Do not use on face/ open                           Wound or open sores. Avoid contact with eyes, ears mouth and genitals (private parts).                       Wash face,  Genitals (private parts) with your normal soap.             6.  Wash thoroughly, paying special attention to the area where your  surgery  will be performed.  7.  Thoroughly rinse your body with warm water from the neck down.  8.  DO NOT shower/wash with your normal soap after using and rinsing off the CHG Soap.            9.  Pat yourself dry with a clean towel.            10.  Wear clean pajamas.            11.  Place clean sheets on your bed the night of your first shower and do not  sleep with pets.  ON THE DAY OF SURGERY : Do not apply any lotions/deodorants the morning of surgery.  Please wear clean clothes to the hospital/surgery center.    FAILURE TO FOLLOW THESE INSTRUCTIONS MAY RESULT IN THE CANCELLATION OF YOUR SURGERY  PATIENT SIGNATURE_________________________________  NURSE  SIGNATURE__________________________________  ________________________________________________________________________

## 2023-01-02 ENCOUNTER — Encounter (HOSPITAL_COMMUNITY): Payer: Self-pay

## 2023-01-02 ENCOUNTER — Encounter (HOSPITAL_COMMUNITY)
Admission: RE | Admit: 2023-01-02 | Discharge: 2023-01-02 | Disposition: A | Discharge status: Home / Self Care | Payer: 59 | Source: Ambulatory Visit | Attending: Urology | Admitting: Urology

## 2023-01-02 ENCOUNTER — Other Ambulatory Visit: Payer: Self-pay

## 2023-01-02 VITALS — BP 118/80 | HR 86 | Temp 98.8°F | Resp 16 | Ht 64.0 in | Wt 179.0 lb

## 2023-01-02 DIAGNOSIS — I10 Essential (primary) hypertension: Secondary | ICD-10-CM

## 2023-01-02 DIAGNOSIS — Z01812 Encounter for preprocedural laboratory examination: Secondary | ICD-10-CM | POA: Insufficient documentation

## 2023-01-02 DIAGNOSIS — Z01818 Encounter for other preprocedural examination: Secondary | ICD-10-CM

## 2023-01-02 HISTORY — DX: Chronic kidney disease, unspecified: N18.9

## 2023-01-02 LAB — BASIC METABOLIC PANEL
Anion gap: 9 (ref 5–15)
BUN: 12 mg/dL (ref 6–20)
CO2: 22 mmol/L (ref 22–32)
Calcium: 9.5 mg/dL (ref 8.9–10.3)
Chloride: 103 mmol/L (ref 98–111)
Creatinine, Ser: 0.79 mg/dL (ref 0.44–1.00)
GFR, Estimated: >60 mL/min (ref >60)
Glucose, Bld: 85 mg/dL (ref 70–99)
Potassium: 4.3 mmol/L (ref 3.5–5.1)
Sodium: 134 mmol/L — ABNORMAL LOW (ref 135–145)

## 2023-01-02 LAB — CBC
HCT: 38.4 % (ref 36.0–46.0)
Hemoglobin: 12.2 g/dL (ref 12.0–15.0)
MCH: 30.7 pg (ref 26.0–34.0)
MCHC: 31.8 g/dL (ref 30.0–36.0)
MCV: 96.5 fL (ref 80.0–100.0)
Platelets: 191 10*3/uL (ref 150–400)
RBC: 3.98 MIL/uL (ref 3.87–5.11)
RDW: 15.1 % (ref 11.5–15.5)
WBC: 3.8 10*3/uL — ABNORMAL LOW (ref 4.0–10.5)
nRBC: 0 % (ref 0.0–0.2)

## 2023-01-04 NOTE — Anesthesia Preprocedure Evaluation (Addendum)
Anesthesia Evaluation  Patient identified by MRN, date of birth, ID band Patient awake    Reviewed: Allergy & Precautions, NPO status , Patient's Chart, lab work & pertinent test results  Airway Mallampati: II  TM Distance: >3 FB Neck ROM: Full    Dental  (+) Dental Advisory Given   Pulmonary neg pulmonary ROS TB   Pulmonary exam normal        Cardiovascular hypertension, Normal cardiovascular exam     Neuro/Psych Seizures -,  PSYCHIATRIC DISORDERS  Depression    Brain Tumor S/P Craniotomy    GI/Hepatic negative GI ROS, Neg liver ROS,,,  Endo/Other  negative endocrine ROS    Renal/GU negative Renal ROS     Musculoskeletal  (+) Arthritis ,    Abdominal   Peds  Hematology  (+) HIV  Anesthesia Other Findings Chronic pain  Reproductive/Obstetrics                             Anesthesia Physical Anesthesia Plan  ASA: 3  Anesthesia Plan: General   Post-op Pain Management: Tylenol PO (pre-op)* and Toradol IV (intra-op)*   Induction: Intravenous  PONV Risk Score and Plan: 4 or greater and Ondansetron, Dexamethasone, Midazolam and Scopolamine patch - Pre-op  Airway Management Planned: Oral ETT  Additional Equipment:   Intra-op Plan:   Post-operative Plan: Extubation in OR  Informed Consent: I have reviewed the patients History and Physical, chart, labs and discussed the procedure including the risks, benefits and alternatives for the proposed anesthesia with the patient or authorized representative who has indicated his/her understanding and acceptance.     Dental advisory given  Plan Discussed with: Anesthesiologist and CRNA  Anesthesia Plan Comments:         Anesthesia Quick Evaluation

## 2023-01-05 ENCOUNTER — Ambulatory Visit (HOSPITAL_BASED_OUTPATIENT_CLINIC_OR_DEPARTMENT_OTHER): Payer: 59 | Admitting: Anesthesiology

## 2023-01-05 ENCOUNTER — Encounter (HOSPITAL_COMMUNITY)
Admission: RE | Disposition: A | Discharge status: Home / Self Care | Payer: Self-pay | Source: Ambulatory Visit | Attending: Urology

## 2023-01-05 ENCOUNTER — Encounter (HOSPITAL_COMMUNITY): Payer: Self-pay | Admitting: Urology

## 2023-01-05 ENCOUNTER — Ambulatory Visit (HOSPITAL_COMMUNITY): Payer: 59

## 2023-01-05 ENCOUNTER — Ambulatory Visit (HOSPITAL_COMMUNITY)
Admission: RE | Admit: 2023-01-05 | Discharge: 2023-01-05 | Disposition: A | Discharge status: Home / Self Care | Payer: 59 | Source: Ambulatory Visit | Attending: Urology | Admitting: Urology

## 2023-01-05 ENCOUNTER — Other Ambulatory Visit (HOSPITAL_COMMUNITY): Payer: Self-pay

## 2023-01-05 ENCOUNTER — Ambulatory Visit (HOSPITAL_COMMUNITY): Payer: 59 | Admitting: Physician Assistant

## 2023-01-05 ENCOUNTER — Other Ambulatory Visit: Payer: Self-pay

## 2023-01-05 DIAGNOSIS — I10 Essential (primary) hypertension: Secondary | ICD-10-CM | POA: Diagnosis not present

## 2023-01-05 DIAGNOSIS — Z8249 Family history of ischemic heart disease and other diseases of the circulatory system: Secondary | ICD-10-CM | POA: Insufficient documentation

## 2023-01-05 DIAGNOSIS — Z86011 Personal history of benign neoplasm of the brain: Secondary | ICD-10-CM | POA: Insufficient documentation

## 2023-01-05 DIAGNOSIS — B20 Human immunodeficiency virus [HIV] disease: Secondary | ICD-10-CM | POA: Diagnosis not present

## 2023-01-05 DIAGNOSIS — N3289 Other specified disorders of bladder: Secondary | ICD-10-CM | POA: Diagnosis not present

## 2023-01-05 DIAGNOSIS — N189 Chronic kidney disease, unspecified: Secondary | ICD-10-CM | POA: Diagnosis not present

## 2023-01-05 DIAGNOSIS — G8929 Other chronic pain: Secondary | ICD-10-CM

## 2023-01-05 DIAGNOSIS — M1990 Unspecified osteoarthritis, unspecified site: Secondary | ICD-10-CM

## 2023-01-05 DIAGNOSIS — F32A Depression, unspecified: Secondary | ICD-10-CM | POA: Insufficient documentation

## 2023-01-05 DIAGNOSIS — Z01818 Encounter for other preprocedural examination: Secondary | ICD-10-CM

## 2023-01-05 DIAGNOSIS — G40909 Epilepsy, unspecified, not intractable, without status epilepticus: Secondary | ICD-10-CM | POA: Diagnosis not present

## 2023-01-05 DIAGNOSIS — I129 Hypertensive chronic kidney disease with stage 1 through stage 4 chronic kidney disease, or unspecified chronic kidney disease: Secondary | ICD-10-CM | POA: Insufficient documentation

## 2023-01-05 HISTORY — PX: CYSTOSCOPY WITH RETROGRADE PYELOGRAM, URETEROSCOPY AND STENT PLACEMENT: SHX5789

## 2023-01-05 LAB — POCT PREGNANCY, URINE: Preg Test, Ur: NEGATIVE

## 2023-01-05 SURGERY — CYSTOURETEROSCOPY, WITH RETROGRADE PYELOGRAM AND STENT INSERTION
Anesthesia: General | Laterality: Left

## 2023-01-05 MED ORDER — MIDAZOLAM HCL 5 MG/5ML IJ SOLN
INTRAMUSCULAR | Status: DC | PRN
  Administered 2023-01-05: 2 mg via INTRAVENOUS

## 2023-01-05 MED ORDER — PHENYLEPHRINE 80 MCG/ML (10ML) SYRINGE FOR IV PUSH (FOR BLOOD PRESSURE SUPPORT)
PREFILLED_SYRINGE | INTRAVENOUS | Status: DC | PRN
  Administered 2023-01-05 (×2): 80 ug via INTRAVENOUS

## 2023-01-05 MED ORDER — OXYCODONE-ACETAMINOPHEN 5-325 MG PO TABS
1.0000 | ORAL_TABLET | ORAL | 0 refills | Status: DC | PRN
  Filled 2023-01-05 (×2): qty 18, 3d supply, fill #0

## 2023-01-05 MED ORDER — SCOPOLAMINE 1 MG/3DAYS TD PT72
1.0000 | MEDICATED_PATCH | TRANSDERMAL | Status: DC
  Administered 2023-01-05: 1.5 mg via TRANSDERMAL
  Filled 2023-01-05: qty 1

## 2023-01-05 MED ORDER — ROCURONIUM BROMIDE 10 MG/ML (PF) SYRINGE
PREFILLED_SYRINGE | INTRAVENOUS | Status: DC | PRN
  Administered 2023-01-05: 70 mg via INTRAVENOUS

## 2023-01-05 MED ORDER — CHLORHEXIDINE GLUCONATE 0.12 % MT SOLN
15.0000 mL | Freq: Once | OROMUCOSAL | Status: AC
  Administered 2023-01-05: 15 mL via OROMUCOSAL

## 2023-01-05 MED ORDER — AMISULPRIDE (ANTIEMETIC) 5 MG/2ML IV SOLN: 10.0000 mg | Freq: Once | INTRAVENOUS | Status: DC | PRN

## 2023-01-05 MED ORDER — PROMETHAZINE HCL 25 MG/ML IJ SOLN: 6.2500 mg | INTRAMUSCULAR | Status: DC | PRN

## 2023-01-05 MED ORDER — SUGAMMADEX SODIUM 200 MG/2ML IV SOLN
INTRAVENOUS | Status: DC | PRN
  Administered 2023-01-05: 400 mg via INTRAVENOUS

## 2023-01-05 MED ORDER — ONDANSETRON HCL 4 MG/2ML IJ SOLN
INTRAMUSCULAR | Status: AC
  Filled 2023-01-05: qty 2

## 2023-01-05 MED ORDER — IOHEXOL 300 MG/ML  SOLN
INTRAMUSCULAR | Status: DC | PRN
  Administered 2023-01-05: 10 mL

## 2023-01-05 MED ORDER — ORAL CARE MOUTH RINSE: 15.0000 mL | Freq: Once | OROMUCOSAL | Status: AC

## 2023-01-05 MED ORDER — DEXAMETHASONE SODIUM PHOSPHATE 10 MG/ML IJ SOLN
INTRAMUSCULAR | Status: DC | PRN
  Administered 2023-01-05: 5 mg via INTRAVENOUS

## 2023-01-05 MED ORDER — PHENYLEPHRINE 80 MCG/ML (10ML) SYRINGE FOR IV PUSH (FOR BLOOD PRESSURE SUPPORT)
PREFILLED_SYRINGE | INTRAVENOUS | Status: AC
  Filled 2023-01-05: qty 10

## 2023-01-05 MED ORDER — ROCURONIUM BROMIDE 10 MG/ML (PF) SYRINGE
PREFILLED_SYRINGE | INTRAVENOUS | Status: AC
  Filled 2023-01-05: qty 10

## 2023-01-05 MED ORDER — LIDOCAINE 2% (20 MG/ML) 5 ML SYRINGE
INTRAMUSCULAR | Status: DC | PRN
  Administered 2023-01-05: 100 mg via INTRAVENOUS

## 2023-01-05 MED ORDER — FENTANYL CITRATE (PF) 100 MCG/2ML IJ SOLN
INTRAMUSCULAR | Status: AC
  Filled 2023-01-05: qty 2

## 2023-01-05 MED ORDER — PROPOFOL 10 MG/ML IV BOLUS
INTRAVENOUS | Status: DC | PRN
  Administered 2023-01-05: 130 mg via INTRAVENOUS

## 2023-01-05 MED ORDER — CEPHALEXIN 500 MG PO CAPS
500.0000 mg | ORAL_CAPSULE | Freq: Two times a day (BID) | ORAL | 0 refills | Status: AC
  Filled 2023-01-05 (×2): qty 6, 3d supply, fill #0

## 2023-01-05 MED ORDER — MIDAZOLAM HCL 2 MG/2ML IJ SOLN
INTRAMUSCULAR | Status: AC
  Filled 2023-01-05: qty 2

## 2023-01-05 MED ORDER — SODIUM CHLORIDE 0.9 % IR SOLN
Status: DC | PRN
  Administered 2023-01-05: 6000 mL

## 2023-01-05 MED ORDER — DOCUSATE SODIUM 100 MG PO CAPS
100.0000 mg | ORAL_CAPSULE | Freq: Every day | ORAL | 0 refills | Status: AC | PRN
  Filled 2023-01-05: qty 100, 100d supply, fill #0

## 2023-01-05 MED ORDER — ONDANSETRON HCL 4 MG/2ML IJ SOLN
INTRAMUSCULAR | Status: DC | PRN
  Administered 2023-01-05: 4 mg via INTRAVENOUS

## 2023-01-05 MED ORDER — CEFAZOLIN SODIUM-DEXTROSE 2-4 GM/100ML-% IV SOLN
2.0000 g | INTRAVENOUS | Status: AC
  Administered 2023-01-05: 2 g via INTRAVENOUS
  Filled 2023-01-05: qty 100

## 2023-01-05 MED ORDER — ACETAMINOPHEN 500 MG PO TABS
1000.0000 mg | ORAL_TABLET | Freq: Once | ORAL | Status: AC
  Administered 2023-01-05: 1000 mg via ORAL
  Filled 2023-01-05: qty 2

## 2023-01-05 MED ORDER — FENTANYL CITRATE (PF) 100 MCG/2ML IJ SOLN
INTRAMUSCULAR | Status: DC | PRN
  Administered 2023-01-05 (×2): 50 ug via INTRAVENOUS

## 2023-01-05 MED ORDER — DEXAMETHASONE SODIUM PHOSPHATE 10 MG/ML IJ SOLN
INTRAMUSCULAR | Status: AC
  Filled 2023-01-05: qty 1

## 2023-01-05 MED ORDER — LACTATED RINGERS IV SOLN: INTRAVENOUS | Status: DC

## 2023-01-05 MED ORDER — FENTANYL CITRATE PF 50 MCG/ML IJ SOSY: 25.0000 ug | PREFILLED_SYRINGE | INTRAMUSCULAR | Status: DC | PRN

## 2023-01-05 SURGICAL SUPPLY — 19 items
BAG DRN RND TRDRP ANRFLXCHMBR (UROLOGICAL SUPPLIES)
BAG URINE DRAIN 2000ML AR STRL (UROLOGICAL SUPPLIES) IMPLANT
BAG URO CATCHER STRL LF (MISCELLANEOUS) ×2 IMPLANT
CATH URETL OPEN 5X70 (CATHETERS) ×1 IMPLANT
DRAPE FOOT SWITCH (DRAPES) ×2 IMPLANT
ELECT REM PT RETURN 15FT ADLT (MISCELLANEOUS) ×2 IMPLANT
EVACUATOR MICROVAS BLADDER (UROLOGICAL SUPPLIES) IMPLANT
GLOVE SURG LX STRL 7.5 STRW (GLOVE) ×2 IMPLANT
GOWN STRL REUS W/ TWL XL LVL3 (GOWN DISPOSABLE) ×2 IMPLANT
GOWN STRL REUS W/TWL XL LVL3 (GOWN DISPOSABLE) ×2
HOLDER FOLEY CATH W/STRAP (MISCELLANEOUS) IMPLANT
KIT TURNOVER KIT A (KITS) IMPLANT
LOOP CUT BIPOLAR 24F LRG (ELECTROSURGICAL) IMPLANT
MANIFOLD NEPTUNE II (INSTRUMENTS) ×2 IMPLANT
PACK CYSTO (CUSTOM PROCEDURE TRAY) ×2 IMPLANT
SYR TOOMEY IRRIG 70ML (MISCELLANEOUS)
SYRINGE TOOMEY IRRIG 70ML (MISCELLANEOUS) IMPLANT
TUBING CONNECTING 10 (TUBING) ×2 IMPLANT
TUBING UROLOGY SET (TUBING) ×2 IMPLANT

## 2023-01-05 NOTE — Op Note (Signed)
Operative Note  Preoperative diagnosis:  1.  Bladder mass  Postoperative diagnosis: 1.  Bladder mass  Procedure(s): 1.  TURBT small 2.  Bilateral retrograde pyelograms 3.  Diagnostic left ureteroscopy 4.  Fluoroscopy  Surgeon: Rexene Alberts, MD  Assistants:  None  Anesthesia:  General  Complications:  None  EBL: Minimal  Specimens: 1.  ID Type Source Tests Collected by Time Destination  1 : Left bladder wall bladder tumor Tissue PATH GU biopsy SURGICAL PATHOLOGY Janith Lima, MD 01/05/2023 1408    Drains/Catheters: None  Intraoperative findings:    Nodular inflammatory lesion lateral to the left ureteral orifice there appeared to be small diverticula.  No papillary lesion identified.  Mucosa was erythematous and inflammatory.  Excellent resection without evidence of perforation.  Excellent hemostasis. Bilateral retrograde pyelogram with no filling defect, no hydronephrosis, no extravasation of contrast. Left diagnostic ureteroscopy without left ureteral lesion.  Number of tumors: 1       Size of largest tumor:       2cm  Characteristics of tumors:     Nodular  Primary: Yes  Suspicious for Carcinoma in situ:    No  Clinical tumor stage:             cTa      Bimanual exam under anesthesia:        Performed: No evidence of 3-dimensional mass  Visually complete resection:                 Yes  Visualization of detrusor muscle in resection base:        Yes  Visual evaluation for perforation:             Performed: No evidence of perforation   Indication:  Kelsey Rodriguez is a 32 y.o. female with history of bladder mass initially identified on pelvic ultrasound.  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  Mass was confirmed on cystoscopy.  All the risks, benefits were discussed with the patient to include but not limited to infection, pain, bleeding, damage to adjacent structures, need for further  operations, adverse reaction to anesthesia and death.  Patient understands these risks and agrees to proceed with the operation as planned.    Description of procedure: After informed consent was obtained from the patient, the patient was taken to the operating room. General anesthesia was administered. The patient was placed in dorsal lithotomy position and prepped and draped in usual sterile fashion. Sequential compression devices were applied to lower extremities at the beginning of the case for DVT prophylaxis. Antibiotics were infused prior to surgery start time. A surgical time-out was performed to properly identify the patient, the surgery to be performed, and the surgical site.     We then passed the 21-French rigid cystoscope down the urethra and into the bladder under direct vision without any difficulty. The anterior urethral was normal. The bladder was inspected with 30 and 70 degree lenses. Once in the bladder, systematic evaluation of bladder revealed a nodular lesion approximately 2 x 2 cm just lateral to the left ureteral orifice.  After filling the wire in this location, this appeared to be a small diverticulum.  This was inflammatory with erythematous mucosa and some nodularity.. The ureteral orfices were in orthotopic position and not involved.  Right retrograde pyelogram was performed by introducing 5 [place] open-ended cath into the distal right ureter.  Contrast was instilled.  There was no extravasation of  contrast, no filling defects, no hydronephrosis.  The contrast drained probably.  In similar fashion, left retrograde pyelogram was performed instilling contrast into the distal left ureter.  There is no filling defect, hydronephrosis or extravasation of contrast.  The contrast drained promptly.  I then introduced a semirigid ureteroscope in the distal left ureter and advanced this to the proximal ureter.  There were no suspicious lesions present.   We then removed the cystoscope and  then passed down the 26 French resectoscope sheath down the urethra into the bladder under direct vision with the visual obturator. The tumor was resected down to muscle. The TUR bladder tumor chips were retrieved from the bladder and each region of resection was passed off the field as a separate specimen.  Hemostasis was achieved using electrocautery.  There is no evidence of perforation. The patient tolerated the procedure well with no complication and was awoken from anesthesia and taken to recovery in stable condition.     Plan: Discharge home.  She will follow with me in 1 week to review pathology.  Matt R[facility]  Pager: [phone]

## 2023-01-05 NOTE — Discharge Instructions (Signed)
Activity:  You are encouraged to ambulate frequently (about every hour during waking hours) to help prevent blood clots from forming in your legs or lungs.   ? ?Diet: You should advance your diet as instructed by your physician.  It will be normal to have some bloating, nausea, and abdominal discomfort intermittently. ? ?Prescriptions:  You will be provided a prescription for pain medication to take as needed.  If your pain is not severe enough to require the prescription pain medication, you may take extra strength Tylenol instead which will have less side effects.  You should also take a prescribed stool softener to avoid straining with bowel movements as the prescription pain medication may constipate you. ? ?What to call us about: You should call the office ([phone]) if you develop fever > 101 or develop persistent vomiting. Activity:  You are encouraged to ambulate frequently (about every hour during waking hours) to help prevent blood clots from forming in your legs or lungs.  ?

## 2023-01-05 NOTE — Anesthesia Procedure Notes (Signed)
Procedure Name: Intubation Date/Time: 01/05/2023 1:41 PM  Performed by: Victoriano Lain, CRNAPre-anesthesia Checklist: Patient identified, Emergency Drugs available, Suction available, Patient being monitored and Timeout performed Patient Re-evaluated:Patient Re-evaluated prior to induction Oxygen Delivery Method: Circle system utilized Preoxygenation: Pre-oxygenation with 100% oxygen Induction Type: IV induction Ventilation: Mask ventilation without difficulty Laryngoscope Size: Mac and 4 Grade View: Grade I Tube type: Oral Tube size: 7.5 mm Number of attempts: 1 Airway Equipment and Method: Stylet Placement Confirmation: ETT inserted through vocal cords under direct vision, positive ETCO2 and breath sounds checked- equal and bilateral Secured at: 21 cm Tube secured with: Tape Dental Injury: Teeth and Oropharynx as per pre-operative assessment

## 2023-01-05 NOTE — H&P (Signed)
Office Visit Report 12/23/2022    Kelsey Rodriguez         MRN: [id number]  PRIMARY CARE:           REFERRING:  Janith Lima, MD  DOB: [birth year], 32 year old Female  PROVIDER:  Rexene Alberts, M.D.  SSN:   LOCATION:  [facility] [phone]    CC/HPI: Kelsey Rodriguez is a 32 year old female who is seen in follow-up today with a bladder lesion.   #1. Bladder lesion:  -She developed heavy menses bleeding and pelvic ultrasound was obtained in 10/20/2022 that incidentally revealed soft tissue echogenicity at the posterior bladder with slightly lobulated configuration at the region of the left UVJ, nonspecific and could be related to focal inflammation or potentially soft tissue mass.  -CT urogram 12/17/2022 with irregular area filling defect along the posterior lateral left bladder measuring 1.3 x 0.9 cm at the level of the ureterovesical junction. No obstruction of the left ureter. Could represent bladder lesion versus posttreatment changes from prior vesicoureteral reflux.  -Cystoscopy 12/23/2022: Thick nodular approximately 1 x 1 cm lesion overlying the left ureteral orifice. No overlying erythema and no papillary component.  -She declined prior history of vesicoureteral reflux and denies prior urologic procedures.  -She denies gross hematuria. She has had no evidence of microscopic hematuria. She denies smoking history. She denies a family history of urologic malignancy. She has no personal history of cancer.  -She denies problematic voiding symptoms. She has mild urgency. She denies incontinence.  -She denies a history of urolithiasis and has had no history of recurrent urinary tract infections.   #2. Back pain: She states that she has had chronic back pain and typically localizes pain to her spine including the cervical spine and thoracic spine. She states that intermittently, she can have pain in her flank. She denies history urolithiasis or urinary tract infections.   Patient  currently denies fever, chills, sweats, nausea, vomiting, abdominal or flank pain, gross hematuria or dysuria.   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 She has had an appendectomy, craniotomy, laparoscopic oophorectomy, bilateral hip arthroplasty.     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  <DIV'  Tobacco Use Assessment Completed:  Used Tobacco in last 30 days?   Does drink.  Drinks 1 caffeinated drink per day.    REVIEW OF SYSTEMS:     GU Review Female:  Patient denies frequent urination, hard to postpone urination, burning /pain with urination, get up at night to urinate, leakage of urine, stream starts and stops, trouble starting your stream, have to strain  to urinate, and being pregnant.    Gastrointestinal (Upper):  Patient denies nausea, vomiting, and indigestion/ heartburn.    Gastrointestinal (Lower):  Patient denies constipation and diarrhea.    Constitutional:  Patient denies fever, night sweats, weight loss, and fatigue.    Skin:  Patient denies skin rash/ lesion and itching.     Eyes:  Patient denies blurred vision and double vision.    Ears/ Nose/ Throat:  Patient denies sore throat and sinus problems.    Hematologic/Lymphatic:  Patient denies swollen glands and easy bruising.    Cardiovascular:  Patient denies leg swelling and chest pains.    Respiratory:  Patient denies cough and shortness of breath.    Endocrine:  Patient denies excessive thirst.    Musculoskeletal:  Patient denies back pain and joint pain.    Neurological:  Patient denies headaches and dizziness.    Psychologic:  Patient denies depression and anxiety.    VITAL SIGNS: None     MULTI-SYSTEM PHYSICAL EXAMINATION:      Constitutional: Well-nourished. No physical deformities. Normally developed. Good grooming.     Respiratory: No labored breathing, no use of accessory muscles.      Cardiovascular: Normal temperature, normal extremity pulses, no swelling, no varicosities.     Gastrointestinal: No mass, no tenderness, no rigidity, non obese abdomen.            Complexity of Data:   Source Of History:  Patient, Medical Record Summary  Records Review:  Previous Doctor Records, Previous Hospital Records, Previous Patient Records  Urine Test Review:  Urinalysis  X-Ray Review: C.T. Hematuria: Reviewed Films. Reviewed Report. Discussed With Patient.     Notes:  CLINICAL DATA: Bladder neoplasm suspected on recent pelvic  ultrasound examination   EXAM:  CT ABDOMEN AND PELVIS WITHOUT AND WITH CONTRAST   TECHNIQUE:  Multidetector CT imaging of the abdomen and pelvis was performed  following the standard protocol before and following the bolus  administration of intravenous contrast.   RADIATION DOSE REDUCTION: This exam was performed according to the  departmental dose-optimization program which includes automated  exposure control, adjustment of the mA and/or kV according to  patient size and/or use of iterative reconstruction technique.   CONTRAST: 150 mL Omnipaque 300   COMPARISON:  Ultrasound pelvis dated 10/20/2022, CT abdomen and  pelvis dated 04/14/2021   FINDINGS:  Lower chest: No focal consolidation or pulmonary nodule in the lung  bases. No pleural effusion or pneumothorax demonstrated. Partially  imaged heart size is normal.   Hepatobiliary: No focal hepatic lesions. No intra or extrahepatic  biliary ductal dilation. Gallbladder fundal thickening, likely  adenomyomatosis.   Pancreas: No focal lesions or main ductal dilation.   Spleen: Normal in size without focal abnormality.   Adrenals/Urinary Tract: No adrenal nodules. No suspicious renal  mass, calculi or hydronephrosis. Somewhat suboptimal evaluation of  the bladder and pelvic structures due to beam hardening artifact  from the adjacent hip arthroplasty hardware. No suspicious filling  defect visualized within the opacified portions of the collecting  systems or ureters on delayed imaging, however the distal ureters  are not well opacified. irregular area of filling defect along the  posterolateral left bladder measuring approximately 1.3 x 0.9 cm  (6:76) at the level of the ureterovesical junction.   Stomach/Bowel: Normal appearance of the stomach. No evidence of  bowel wall thickening, distention, or inflammatory changes. Appendix  is not discretely seen.   Vascular/Lymphatic: No significant vascular findings are present.  Retroaortic  left renal vein. No enlarged abdominal or pelvic lymph  nodes.   Reproductive: No adnexal masses. Fluid collection along the left  lateral aspect of the vaginal introitus measures 1.8 x 1.2 cm  (6:88), likely unchanged compared to 04/14/2021.   Other: No free fluid, fluid collection, or free air.   Musculoskeletal: Bilateral hip arthroplasties. No acute or abnormal  lytic or blastic osseous lesions.   IMPRESSION:  1. Somewhat suboptimal evaluation of the pelvic structures due to  beam hardening artifact from the adjacent hip arthroplasty hardware.  2.  Queried finding on prior pelvic ultrasound corresponds to an  irregular area of filling defect along the posterolateral left  bladder measuring approximately 1.3 x 0.9 cm at the level of the  ureterovesical junction. Notably, there is no obstruction of the  left ureter. Finding could represent a bladder lesion versus  posttreatment changes if there has been prior treatment of  vesicoureteral reflux. Recommend correlation with cystoscopy.  3. Fluid collection along the left lateral aspect of the vaginal  introitus measures 1.8 x 1.2 cm, likely unchanged compared to  04/14/2021. Finding likely represents a Bartholin's gland cyst.  4. Gallbladder fundal thickening, likely adenomyomatosis.    Electronically Signed  By: Darrin Nipper M.D.  On: 12/18/2022 11:26     PROCEDURES:    Flexible Cystoscopy - 52000  Risks, benefits, and some of the potential complications of the procedure were discussed at length with the patient including infection, bleeding, voiding discomfort, urinary retention, fever, chills, sepsis, and others. All questions were answered. Informed consent was obtained. Antibiotic prophylaxis was given. Sterile technique and intraurethral analgesia were used.  Meatus:  Normal size. Normal location. Normal condition.  Urethra:  No hypermobility. No leakage.  Ureteral Orifices:  Right ureteral orifice in normal orthotopic position. Left ureteral orifice obscured by lesion. No papillary lesion however thick nodular tissue in the location of the left ureteral orifice. No overlying erythema or papillary structure.  Bladder:  No trabeculation. Normal mucosa. No stones. Lesion as described above overlying the left ureteral orifice with nodular thickened tissue. No erythema. No papillary component.      The lower urinary tract was carefully examined. The procedure was well-tolerated and without complications. Antibiotic instructions were given. Instructions were given to call the office  immediately for bloody urine, difficulty urinating, urinary retention, painful or frequent urination, fever, chills, nausea, vomiting or other illness. The patient stated that she understood these instructions and would comply with them.    Urinalysis w/Scope  Dipstick Dipstick Cont'd Micro  Color: Yellow Bilirubin: Neg mg/dL WBC/hpf: NS (Not Seen)  Appearance: Slightly Cloudy Ketones: Trace mg/dL RBC/hpf: NS (Not Seen)  Specific Gravity: 1.025 Blood: Neg ery/uL Bacteria: Mod (26-50/hpf)  pH: 5.5 Protein: Trace mg/dL Cystals: NS (Not Seen)  Glucose: Neg mg/dL Urobilinogen: 0.2 mg/dL Casts: NS (Not Seen)   Nitrites: Neg Trichomonas: Not Present   Leukocyte Esterase: Neg leu/uL Mucous: Present    Epithelial Cells: 10 - 20/hpf    Yeast: NS (Not Seen)    Sperm: Not Present    ASSESSMENT:     ICD-10 Details  1 GU:  Bladder tumor/neoplasm - D41.4   2  Flank Pain - R10.84    PLAN:   Document  Letter(s):  Created for Patient: Clinical Summary   Notes:  #1. Bladder lesion:  - I reviewed pelvic ultrasound 10/2022 with bladder lesion.  -I reviewed CT urogram 12/18/2022 as above.  -Cystoscopy today with 1 x 1 cm lesion overlying the left renal  orifice. I discussed this is likely benign however would like to rule out with biopsy.  -Send urine for cytology today.  -I recommend cystoscopy, bilateral retrograde pyelogram, left ureteroscopy, bladder biopsy, possible TURBT. Risk benefits discussed. Surgery letter sent.   Risks and benefits of Transurethral Resection of Bladder Tumor were reviewed in detail including infection, bleeding, blood transfusion, injury to bladder/urethra/surrounding structures, bladder perforation, obstructive and irritative voiding symptoms, and global anesthesia risks including but not limited to CVA, MI, DVT, PE, pneumonia, and death. Patient expressed understanding and desire to proceed.   #2. Back pain: No apparent genitourinary cause with no evidence of renal mass,  stones or hydronephrosis.   Urology Preoperative H&P   Chief Complaint: Bladder mass  History of Present Illness: Kelsey Rodriguez is a 32 y.o. female with a bladder mass and urine cytology suspicious for HG UCB here for cystoscopy, left retrograde pyelogram, left ureteroscopy, TURBT.     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   Bullae 05/30/2012   Cerebral edema ([place]) 10/28/2019   Cervical dysplasia, mild 12/06/2020   Chronic back pain    Chronic kidney disease    Chronic leg pain    bilateral knees, ankles   Chronic pelvic pain in female 01/04/2019   Complex regional pain syndrome 02/03/2017   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   Status post total replacement of right hip  04/04/2016   Suicide ideation    Syphilis 02/26/2018   Tendinopathy of left shoulder 01/18/2019   TOA (tubo-ovarian abscess) 04/15/2021   Tuberculosis    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   Tubo-ovarian abscess 04/25/2021   Vertigo    Wears glasses     Past Surgical History:  Procedure Laterality Date   APPENDECTOMY  ~ 0086   APPLICATION OF CRANIAL NAVIGATION N/A 05/16/2020   Procedure: APPLICATION OF CRANIAL NAVIGATION;  Surgeon: Newman Pies, MD;  Location: [place];  Service: Neurosurgery;  Laterality: N/A;   CRANIOTOMY Right 05/16/2020   Procedure: Craniotomy for Resection of Lesion;  Surgeon: Newman Pies, MD;  Location: [place];  Service: Neurosurgery;  Laterality: Right;  right   DILATION AND CURETTAGE OF UTERUS  12/15/2006   ESOPHAGOGASTRODUODENOSCOPY  03/11/2012  Procedure: ESOPHAGOGASTRODUODENOSCOPY (EGD);  Surgeon: Lafayette Dragon, MD;  Location[facility] ENDOSCOPY;  Service: Endoscopy;  Laterality: N/A;   ESOPHAGOGASTRODUODENOSCOPY N/A 03/07/2014   Procedure: ESOPHAGOGASTRODUODENOSCOPY (EGD);  Surgeon: Gatha Mayer, MD;  [facility];  Service: Endoscopy;  Laterality: N/A;   LAPAROSCOPIC OOPHERECTOMY  05/2021   [facility] - LSO and right salpingectomy   LUNG BIOPSY  02/13/2012   TOTAL HIP ARTHROPLASTY Left 12/14/2015   Procedure: LEFT TOTAL HIP ARTHROPLASTY ANTERIOR APPROACH;  Surgeon: Mcarthur Rossetti, MD;  Location: [facility];  Service: Orthopedics;  Laterality: Left;   TOTAL HIP ARTHROPLASTY Right 04/04/2016   Procedure: RIGHT TOTAL HIP ARTHROPLASTY ANTERIOR APPROACH;  Surgeon: Mcarthur Rossetti, MD;  Location: [facility];  Service: Orthopedics;  Laterality: Right;    Allergies:  Allergies  Allergen Reactions   Hydrocodone Itching and Nausea Only    Pt states she can tolerate with benadryl and Tolerates Oxycodone   Tramadol Itching and Nausea Only    Pt states she can tolerate with benadryl and Tolerates oxycodone    Family History   Problem Relation Age of Onset   Heart disease Father        Vague not clearly cardiac   Hypertension Mother     Social History:  reports that she has never smoked. She has never used smokeless tobacco. She reports that she does not currently use alcohol. She reports that she does not use drugs.  ROS: A complete review of systems was performed.  All systems are negative except for pertinent findings as noted.  Physical Exam:  Vital signs in last 24 hours:   Constitutional:  Alert and oriented, No acute distress Cardiovascular: Regular rate and rhythm Respiratory: Normal respiratory effort, Lungs clear bilaterally GI: Abdomen is soft, nontender, nondistended, no abdominal masses GU: No CVA tenderness Lymphatic: No lymphadenopathy Neurologic: Grossly intact, no focal deficits Psychiatric: Normal mood and affect  Laboratory Data:  No results for input(s): "WBC", "HGB", "HCT", "PLT" in the last 72 hours.  No results for input(s): "NA", "K", "CL", "GLUCOSE", "BUN", "CALCIUM", "CREATININE" in the last 72 hours.  Invalid input(s): "CO3"   No results found. However, due to the size of the patient record, not all encounters were searched. Please check Results Review for a complete set of results. No results found for this or any previous visit (from the past 240 hour(s)).  Renal Function: Recent Labs    01/02/23 0843  CREATININE 0.79   Estimated Creatinine Clearance: 102.1 mL/min (by C-G formula based on SCr of 0.79 mg/dL).  Radiologic Imaging: No results found.  I independently reviewed the above imaging studies.  Assessment and Plan Kelsey Rodriguez is a 32 y.o. female with a bladder mass and urine cytology suspicious for HG UCB here for cystoscopy, left retrograde pyelogram, left ureteroscopy, TURBT.   Risks and benefits of Transurethral Resection of Bladder Tumor were reviewed in detail including infection, bleeding, blood transfusion, injury to  bladder/urethra/surrounding structures, bladder perforation, obstructive and irritative voiding symptoms, and global anesthesia risks including but not limited to CVA, MI, DVT, PE, pneumonia, and death. Patient expressed understanding and desire to proceed.   Matt R. Darion Juhasz MD 01/05/2023, 11:52 AM  [facility] Pager: [phone]

## 2023-01-05 NOTE — Transfer of Care (Signed)
Immediate Anesthesia Transfer of Care Note  Patient: Naomy Bih Demarco  Procedure(s) Performed: CYSTOSCOPY WITH BILATERAL RETROGRADE PYELOGRAM, LEFT DIAGNOSTIC URETEROSCOPY AND BLADDER BIOPSY (Left)  Patient Location: PACU  Anesthesia Type:General  Level of Consciousness: drowsy and patient cooperative  Airway & Oxygen Therapy: Patient Spontanous Breathing and Patient connected to face mask oxygen  Post-op Assessment: Report given to RN and Post -op Vital signs reviewed and stable  Post vital signs: Reviewed and stable  Last Vitals:  Vitals Value Taken Time  BP 128/92 01/05/23 1425  Temp    Pulse    Resp 11 01/05/23 1429  SpO2    Vitals shown include unvalidated device data.  Last Pain:  Vitals:   01/05/23 1223  TempSrc:   PainSc: 0-No pain         Complications: No notable events documented.

## 2023-01-05 NOTE — Anesthesia Postprocedure Evaluation (Signed)
Anesthesia Post Note  Patient: Kelsey Rodriguez  Procedure(s) Performed: CYSTOSCOPY WITH BILATERAL RETROGRADE PYELOGRAM, LEFT DIAGNOSTIC URETEROSCOPY AND BLADDER BIOPSY (Left)     Patient location during evaluation: PACU Anesthesia Type: General Level of consciousness: sedated Pain management: pain level controlled Vital Signs Assessment: post-procedure vital signs reviewed and stable Respiratory status: spontaneous breathing and respiratory function stable Cardiovascular status: stable Postop Assessment: no apparent nausea or vomiting Anesthetic complications: no  No notable events documented.  Last Vitals:  Vitals:   01/05/23 1445 01/05/23 1500  BP: (!) 117/90 (!) 134/93  Pulse:  74  Resp: 15 13  Temp:    SpO2:  95%    Last Pain:  Vitals:   01/05/23 1500  TempSrc:   PainSc: 0-No pain                 Jadier Rockers DANIEL

## 2023-01-06 ENCOUNTER — Encounter (HOSPITAL_COMMUNITY): Payer: Self-pay | Admitting: Urology

## 2023-01-06 LAB — SURGICAL PATHOLOGY

## 2023-01-14 ENCOUNTER — Other Ambulatory Visit (HOSPITAL_COMMUNITY): Payer: Self-pay

## 2023-01-16 ENCOUNTER — Other Ambulatory Visit (HOSPITAL_COMMUNITY): Payer: Self-pay

## 2023-01-19 ENCOUNTER — Other Ambulatory Visit: Payer: Self-pay

## 2023-01-23 ENCOUNTER — Telehealth: Payer: Self-pay | Admitting: Family Medicine

## 2023-01-23 NOTE — Telephone Encounter (Signed)
Called patient to make sure that patient has heard about urothelial biopsy results. Patient said surgeon told her that biopsy was not malignant as result showed.   Lowry Ram, MD

## 2023-02-12 ENCOUNTER — Other Ambulatory Visit (HOSPITAL_COMMUNITY): Payer: Self-pay

## 2023-02-19 ENCOUNTER — Encounter: Payer: Self-pay | Admitting: Internal Medicine

## 2023-02-19 ENCOUNTER — Telehealth: Payer: Self-pay

## 2023-02-19 ENCOUNTER — Ambulatory Visit (HOSPITAL_COMMUNITY)
Admission: EM | Admit: 2023-02-19 | Discharge: 2023-02-19 | Disposition: A | Discharge status: Home / Self Care | Payer: 59

## 2023-02-19 ENCOUNTER — Encounter (HOSPITAL_COMMUNITY): Payer: Self-pay

## 2023-02-19 ENCOUNTER — Ambulatory Visit: Payer: 59 | Admitting: Orthopaedic Surgery

## 2023-02-19 ENCOUNTER — Emergency Department (HOSPITAL_COMMUNITY)
Admission: EM | Admit: 2023-02-19 | Discharge: 2023-02-19 | Disposition: A | Discharge status: Home / Self Care | Payer: 59 | Attending: Emergency Medicine | Admitting: Emergency Medicine

## 2023-02-19 ENCOUNTER — Other Ambulatory Visit: Payer: Self-pay

## 2023-02-19 ENCOUNTER — Emergency Department (HOSPITAL_COMMUNITY): Payer: 59

## 2023-02-19 DIAGNOSIS — Z21 Asymptomatic human immunodeficiency virus [HIV] infection status: Secondary | ICD-10-CM | POA: Insufficient documentation

## 2023-02-19 DIAGNOSIS — R519 Headache, unspecified: Secondary | ICD-10-CM | POA: Diagnosis present

## 2023-02-19 LAB — CBC WITH DIFFERENTIAL/PLATELET
Abs Immature Granulocytes: 0.01 10*3/uL (ref 0.00–0.07)
Basophils Absolute: 0 10*3/uL (ref 0.0–0.1)
Basophils Relative: 1 %
Eosinophils Absolute: 0.5 10*3/uL (ref 0.0–0.5)
Eosinophils Relative: 8 %
HCT: 37.4 % (ref 36.0–46.0)
Hemoglobin: 12.2 g/dL (ref 12.0–15.0)
Immature Granulocytes: 0 %
Lymphocytes Relative: 37 %
Lymphs Abs: 2.1 10*3/uL (ref 0.7–4.0)
MCH: 30.9 pg (ref 26.0–34.0)
MCHC: 32.6 g/dL (ref 30.0–36.0)
MCV: 94.7 fL (ref 80.0–100.0)
Monocytes Absolute: 0.5 10*3/uL (ref 0.1–1.0)
Monocytes Relative: 8 %
Neutro Abs: 2.6 10*3/uL (ref 1.7–7.7)
Neutrophils Relative %: 46 %
Platelets: 243 10*3/uL (ref 150–400)
RBC: 3.95 MIL/uL (ref 3.87–5.11)
RDW: 15.6 % — ABNORMAL HIGH (ref 11.5–15.5)
WBC: 5.7 10*3/uL (ref 4.0–10.5)
nRBC: 0 % (ref 0.0–0.2)

## 2023-02-19 LAB — COMPREHENSIVE METABOLIC PANEL
ALT: 18 U/L (ref 0–44)
AST: 22 U/L (ref 15–41)
Albumin: 4 g/dL (ref 3.5–5.0)
Alkaline Phosphatase: 86 U/L (ref 38–126)
Anion gap: 9 (ref 5–15)
BUN: 9 mg/dL (ref 6–20)
CO2: 19 mmol/L — ABNORMAL LOW (ref 22–32)
Calcium: 9.4 mg/dL (ref 8.9–10.3)
Chloride: 107 mmol/L (ref 98–111)
Creatinine, Ser: 0.77 mg/dL (ref 0.44–1.00)
GFR, Estimated: >60 mL/min (ref >60)
Glucose, Bld: 87 mg/dL (ref 70–99)
Potassium: 3.9 mmol/L (ref 3.5–5.1)
Sodium: 135 mmol/L (ref 135–145)
Total Bilirubin: 0.4 mg/dL (ref 0.3–1.2)
Total Protein: 8.6 g/dL — ABNORMAL HIGH (ref 6.5–8.1)

## 2023-02-19 LAB — LACTIC ACID, PLASMA: Lactic Acid, Venous: 1.1 mmol/L (ref 0.5–1.9)

## 2023-02-19 LAB — POC URINE PREG, ED: Preg Test, Ur: NEGATIVE

## 2023-02-19 MED ORDER — MAGNESIUM SULFATE IN D5W 1-5 GM/100ML-% IV SOLN
1.0000 g | Freq: Once | INTRAVENOUS | Status: AC
  Administered 2023-02-19: 1 g via INTRAVENOUS
  Filled 2023-02-19: qty 100

## 2023-02-19 MED ORDER — METOCLOPRAMIDE HCL 5 MG/ML IJ SOLN
10.0000 mg | Freq: Once | INTRAMUSCULAR | Status: AC
  Administered 2023-02-19: 10 mg via INTRAVENOUS
  Filled 2023-02-19: qty 2

## 2023-02-19 MED ORDER — GADOBUTROL 1 MMOL/ML IV SOLN
8.0000 mL | Freq: Once | INTRAVENOUS | Status: AC | PRN
  Administered 2023-02-19: 8 mL via INTRAVENOUS

## 2023-02-19 MED ORDER — ONDANSETRON HCL 4 MG PO TABS: 4.0000 mg | ORAL_TABLET | Freq: Four times a day (QID) | ORAL | 0 refills | Status: DC

## 2023-02-19 MED ORDER — PROCHLORPERAZINE EDISYLATE 10 MG/2ML IJ SOLN
10.0000 mg | Freq: Once | INTRAMUSCULAR | Status: AC
  Administered 2023-02-19: 10 mg via INTRAVENOUS
  Filled 2023-02-19: qty 2

## 2023-02-19 MED ORDER — LACTATED RINGERS IV BOLUS
1000.0000 mL | Freq: Once | INTRAVENOUS | Status: AC
  Administered 2023-02-19: 1000 mL via INTRAVENOUS

## 2023-02-19 MED ORDER — ACETAMINOPHEN 500 MG PO TABS
1000.0000 mg | ORAL_TABLET | Freq: Once | ORAL | Status: AC
  Administered 2023-02-19: 1000 mg via ORAL
  Filled 2023-02-19: qty 2

## 2023-02-19 MED ORDER — KETOROLAC TROMETHAMINE 15 MG/ML IJ SOLN
15.0000 mg | Freq: Once | INTRAMUSCULAR | Status: AC
  Administered 2023-02-19: 15 mg via INTRAVENOUS
  Filled 2023-02-19: qty 1

## 2023-02-19 MED ORDER — OXYCODONE HCL 5 MG PO TABS
5.0000 mg | ORAL_TABLET | Freq: Once | ORAL | Status: AC
  Administered 2023-02-19: 5 mg via ORAL
  Filled 2023-02-19: qty 1

## 2023-02-19 MED ORDER — DEXAMETHASONE SODIUM PHOSPHATE 4 MG/ML IJ SOLN
4.0000 mg | Freq: Once | INTRAMUSCULAR | Status: AC
  Administered 2023-02-19: 4 mg via INTRAVENOUS
  Filled 2023-02-19: qty 1

## 2023-02-19 MED ORDER — DIPHENHYDRAMINE HCL 50 MG/ML IJ SOLN
25.0000 mg | Freq: Once | INTRAMUSCULAR | Status: AC
  Administered 2023-02-19: 25 mg via INTRAVENOUS
  Filled 2023-02-19: qty 1

## 2023-02-19 NOTE — ED Notes (Signed)
Patient is being discharged from the Urgent Care and sent to the Emergency Department via POV . Per Caryl Ada, PA-C, patient is in need of higher level of care due to headache, higher level of care needed. Patient is aware and verbalizes understanding of plan of care.  Vitals:   02/19/23 1220  BP: 126/83  Pulse: 87  Resp: 18  Temp: 98.5 F (36.9 C)  SpO2: 98%

## 2023-02-19 NOTE — Discharge Instructions (Signed)
Go to the ED now

## 2023-02-19 NOTE — ED Triage Notes (Signed)
Pt arrives via EMS from home. Pt reports headache x4 days. Pt has been seen in the past for the same and was found to have lesions on her brain. Pt endorses associated blurred vision and dizziness. Pt is AxOx4. VSS.

## 2023-02-19 NOTE — ED Notes (Signed)
Assumed care of pt here c/o ha dizziness and vision changes. Pt hx of brain lesions was medicated and pain went form 10 to 5 is pending MRI . Patient a/o x 4 has no weakness or deficits and no other complaints.

## 2023-02-19 NOTE — ED Provider Notes (Signed)
[facility]    CSN: [id number] Arrival date & time: 02/19/23  1055      History   Chief Complaint Chief Complaint  Patient presents with   Headache    HPI Kelsey Rodriguez is a 32 y.o. female.   Patient complains of a headache.  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  Patient reports she called her infectious disease doctor who told her to go to the emergency department for evaluation.  Patient reports she is taking ibuprofen and oxycodone without relief of her headache.  Patient complains of soreness in her neck.  She denies any fever or chills she has not had any cough or URI symptoms patient denies sore throat.  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 No language interpreter was used.  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   Acute right-sided low back pain with right-sided sciatica 08/24/2017   Adrenal insufficiency ([place])    Anemia of chronic disease 03/11/2012   ASCUS with positive high risk HPV cervical 09/14/2017   Avascular necrosis of bone of left hip ([place]) 12/14/2015   Avascular necrosis of bone of right hip ([place]) 04/04/2016   Back pain of lumbar region with sciatica 02/12/2015   Bell's palsy 08/26/2013   Brain lesion    Brain tumor ([place]) 05/16/2020   Bullae 05/30/2012   Cerebral edema ([place]) 10/28/2019   Cervical dysplasia, mild 12/06/2020   Chronic back pain    Chronic kidney disease    Chronic leg pain    bilateral knees, ankles   Chronic pelvic pain in female 01/04/2019   Complex regional pain syndrome 02/03/2017   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[place])    Depression    Eczema 02/28/2022   Encephalitis, myelitis, 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[place]) 01/31/2020   Fatigue    GERD (gastroesophageal reflux disease)    Headache    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   HIV (human immunodeficiency virus infection) ([place]) 02/2012   Hypertension    Laceration of ankle, right 11/18/2012   Lower abdominal pain 06/21/2018   Lumbar radiculopathy    Meningitis 02/18/2018   Pain of upper abdomen    Pelvic pain    PID (acute pelvic inflammatory disease) 02/26/2018   Pneumonia    Reflux esophagitis 03/11/2012   Rotator cuff strain 01/26/2020   S/P craniotomy 05/16/2020   Seizure ([place])    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   Suicide ideation    Syphilis 02/26/2018   Tendinopathy of left shoulder 01/18/2019   TOA (tubo-ovarian abscess) 04/15/2021   Tuberculosis    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   Tubo-ovarian abscess 04/25/2021   Vertigo    Wears glasses     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[place])    Seizure ([place])  Toxoplasmosis 11/07/2019   AIDS (acquired immune deficiency syndrome) ([place]) 11/07/2019   Chronic pelvic pain in female 01/04/2019   Tuberculosis    Complex regional pain syndrome 02/03/2017   Primary adrenal insufficiency ([place]) 01/03/2015   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    Past Surgical History:  Procedure Laterality Date   APPENDECTOMY  ~ [id number]   APPLICATION OF CRANIAL NAVIGATION N/A 05/16/2020   Procedure: APPLICATION OF CRANIAL NAVIGATION;  Surgeon: Newman Pies, MD;  Location: [facility];  Service: Neurosurgery;  Laterality: N/A;    CRANIOTOMY Right 05/16/2020   Procedure: Craniotomy for Resection of Lesion;  Surgeon: Newman Pies, MD;  Location: [facility];  Service: Neurosurgery;  Laterality: Right;  right   CYSTOSCOPY WITH RETROGRADE PYELOGRAM, URETEROSCOPY AND STENT PLACEMENT Left 01/05/2023   Procedure: CYSTOSCOPY WITH BILATERAL RETROGRADE PYELOGRAM, LEFT DIAGNOSTIC URETEROSCOPY AND BLADDER BIOPSY;  Surgeon: Janith Lima, MD;  Location: [facility];  Service: Urology;  Laterality: Left;  30 MINUTES NEEDED FOR CASE  ANESTHESIA INTUBATED, PARALYZED   DILATION AND CURETTAGE OF UTERUS  12/15/2006   ESOPHAGOGASTRODUODENOSCOPY  03/11/2012   Procedure: ESOPHAGOGASTRODUODENOSCOPY (EGD);  Surgeon: Lafayette Dragon, MD;  Location: [facility] END[facility]COPY;  Service: Endoscopy;  Laterality: N/A;   ESOPHAGOGASTRODUODENOSCOPY N/A 03/07/2014   Procedure: ESOPHAGOGASTRODUODENOSCOPY (EGD);  Surgeon: Gatha Mayer, MD;  Location: [facility];  Service: Endoscopy;  Laterality: N/A;   LAPAROSCOPIC OOPHERECTOMY  05/2021   [facility] - LSO and right salpingectomy   LUNG BIOPSY  02/13/2012   TOTAL HIP ARTHROPLASTY Left 12/14/2015   Procedure: LEFT TOTAL HIP ARTHROPLASTY ANTERIOR APPROACH;  Surgeon: Mcarthur Rossetti, MD;  Location: [facility];  Service: Orthopedics;  Laterality: Left;   TOTAL HIP ARTHROPLASTY Right 04/04/2016   Procedure: RIGHT TOTAL HIP ARTHROPLASTY ANTERIOR APPROACH;  Surgeon: Mcarthur Rossetti, MD;  Location: [facility];  Service: Orthopedics;  Laterality: Right;    OB History     Gravida  1   Para  0   Term  0   Preterm  0   AB  1   Living  0      SAB  1   IAB  0   Ectopic  0   Multiple  0   Live Births               Home Medications    Prior to Admission medications   Medication Sig Start Date End Date Taking? Authorizing Provider  amitriptyline (ELAVIL) 10 MG tablet Take 1 tablet (10 mg total) by mouth at bedtime as needed for sleep (headaches). 11/05/22 01/05/23  Kommor, [place], MD   bictegravir-emtricitabine-tenofovir AF (BIKTARVY) 50-200-25 MG TABS tablet TAKE 1 TABLET BY MOUTH DAILY. 04/18/22 04/18/23  Campbell Riches, MD  diphenhydrAMINE (BENADRYL) 25 MG tablet Take 25 mg by mouth every 6 (six) hours as needed (when taking oxycodone).    [provider]  docusate sodium (COLACE) 100 MG capsule Take 1 capsule (100 mg total) by mouth daily as needed for up to 30 doses. 01/05/23   Janith Lima, MD  hydroxypropyl methylcellulose / hypromellose (ISOPTO TEARS / GONIOVISC) 2.5 % ophthalmic solution Place 1 drop into both eyes as needed for dry eyes.    [provider]  ibuprofen (ADVIL) 800 MG tablet Take 800 mg by mouth 2 (two) times daily as needed for moderate pain. 05/05/22   [provider]  methocarbamol (ROBAXIN) 500 MG tablet Take 1 tablet (500 mg total) by mouth 4 (four) times daily. 11/27/22   Baxter Flattery,  Caren Griffins, MD  oxyCODONE-acetaminophen (PERCOCET) 5-325 MG tablet Take 1 tablet by mouth every 4 hours as needed for severe pain. 01/05/23   Janith Lima, MD  oxyCODONE-acetaminophen (PERCOCET/ROXICET) 5-325 MG tablet Take 1 tablet by mouth 2 (two) times daily as needed for moderate pain. 12/20/22   [provider]  Polyethylene Glycol 3350 (MIRALAX PO) Take 17 g by mouth daily.    [provider]  sulfamethoxazole-trimethoprim (BACTRIM DS) 800-160 MG tablet Take 1 tablet by mouth daily. 10/24/22 10/24/23  Kuppelweiser, Cassie L, RPH-CPP  tranexamic acid (LYSTEDA) 650 MG TABS tablet Take 2 tablets (1,300 mg total) by mouth 3 (three) times daily. Take during menses for a maximum of five days 10/13/22   Radene Gunning, MD    Family History Family History  Problem Relation Age of Onset   Heart disease Father        Vague not clearly cardiac   Hypertension Mother     Social History Social History   Tobacco Use   Smoking status: Never   Smokeless tobacco: Never  Vaping Use   Vaping Use: Never used  Substance Use Topics    Alcohol use: Not Currently    Comment: socially   Drug use: No     Allergies   Hydrocodone and Tramadol   Review of Systems Review of Systems  Neurological:  Positive for headaches.  All other systems reviewed and are negative.    Physical Exam Triage Vital Signs ED Triage Vitals  Enc Vitals Group     BP 02/19/23 1220 126/83     Pulse Rate 02/19/23 1220 87     Resp 02/19/23 1220 18     Temp 02/19/23 1220 98.5 F (36.9 C)     Temp Source 02/19/23 1220 Oral     SpO2 02/19/23 1220 98 %     Weight --      Height --      Head Circumference --      Peak Flow --      Pain Score 02/19/23 1221 10     Pain Loc --      Pain Edu? --      Excl. in [place]? --    No data found.  Updated Vital Signs BP 126/83 (BP Location: Left Arm)   Pulse 87   Temp 98.5 F (36.9 C) (Oral)   Resp 18   LMP 02/01/2023   SpO2 98%   Visual Acuity Right Eye Distance:   Left Eye Distance:   Bilateral Distance:    Right Eye Near:   Left Eye Near:    Bilateral Near:     Physical Exam Vitals and nursing note reviewed.  Constitutional:      General: She is not in acute distress.    Appearance: She is well-developed.  HENT:     Head: Normocephalic and atraumatic.  Eyes:     Conjunctiva/sclera: Conjunctivae normal.  Cardiovascular:     Rate and Rhythm: Normal rate and regular rhythm.     Heart sounds: No murmur heard. Pulmonary:     Effort: Pulmonary effort is normal. No respiratory distress.     Breath sounds: Normal breath sounds.  Abdominal:     Tenderness: There is no abdominal tenderness.  Musculoskeletal:        General: No swelling.     Cervical back: Neck supple.  Skin:    General: Skin is warm and dry.     Capillary Refill: Capillary refill takes less than 2 seconds.  Neurological:     Mental Status: She is alert.  Psychiatric:        Mood and Affect: Mood normal.      UC Treatments / Results  Labs (all labs ordered are listed, but only abnormal results are  displayed) Labs Reviewed - No data to display  EKG   Radiology No results found.  Procedures Procedures (including critical care time)  Medications Ordered in UC Medications - No data to display  Initial Impression / Assessment and Plan / UC Course  I have reviewed the triage vital signs and the nursing notes.  Pertinent labs & imaging results that were available during my care of the patient were reviewed by me and considered in my medical decision making (see chart for details).    Discussed with patient the fact that she requires more evaluation than can be provided in urgent care.  Given her history of cranial pathology and worsening headache not relieved with narcotics I have advised her I feel she is best evaluated in the emergency department.  Final Clinical Impressions(s) / UC Diagnoses   Final diagnoses:  Bad headache   Discharge Instructions   None    ED Prescriptions   None    PDMP not reviewed this encounter.   Fransico Meadow, [place] 02/19/23 1311

## 2023-02-19 NOTE — Discharge Instructions (Signed)
You were seen in the emergency department for your headache.  Your workup showed no signs of new or worsening brain infection or tumors and no signs of bleeding in your brain.  We treated you with headache medications and you had improvement.  You can continue to take Tylenol and Motrin as well as your home Percocets as needed for your headaches and I have given you Zofran as needed for nausea.  You should follow-up with your primary doctor in the next few days to have your symptoms rechecked and to determine if you need any changes to your medications.  You should return to the emergency department if you are having fevers, you pass out, you have a seizure, you have repetitive vomiting or if you have any other new or concerning symptoms.

## 2023-02-19 NOTE — Telephone Encounter (Signed)
Patient called stating that she is having headaches taking Ibuprofen and Tylenol with no relief. Patient unable to get in with PCP today I advised she go to urgent care to be evaluated as we don't have availability at our office. Patient agreeable to go to UC.    [place], CMA

## 2023-02-19 NOTE — ED Triage Notes (Signed)
Pt c/o headache x4 days. States hx of brain lesion and brain tumor. States taking oxycodone and ibuprofen '800mg'$  with no relief. States called her neurologist and was told to come here or the ED if her headache got worse.

## 2023-02-19 NOTE — ED Provider Notes (Signed)
[facility] Provider Note   CSN: [id number] Arrival date & time: 02/19/23  1653     History  Chief Complaint  Patient presents with   Headache    Kelsey Rodriguez is a 32 y.o. female.   Patient is a 32 year old female with a past medical history of HIV/AIDS with last CD4 count of 55 in August 2023 and prior history of toxoplasmosis status post craniectomy for debulking presenting to the emergency department with a headache.  The patient states that she has had headaches since Monday that was initially coming and going with ibuprofen and oxycodone however she states that for the last 24 hours her headache has been constant.  She states that she has associated nausea and photophobia.  She states that it feels like a similar type of headaches that she has had in the past related to her prior toxoplasmosis infection.  She denies any numbness or weakness, fevers.  She states that she did feel chilled.  She denies any neck pain or stiffness.  The history is provided by the patient.       Home Medications Prior to Admission medications   Medication Sig Start Date End Date Taking? Authorizing Provider  ondansetron (ZOFRAN) 4 MG tablet Take 1 tablet (4 mg total) by mouth every 6 (six) hours. 02/19/23  Yes Maylon Peppers, [place] K, DO  amitriptyline (ELAVIL) 10 MG tablet Take 1 tablet (10 mg total) by mouth at bedtime as needed for sleep (headaches). 11/05/22 01/05/23  Kommor, [place], MD  bictegravir-emtricitabine-tenofovir AF (BIKTARVY) 50-200-25 MG TABS tablet TAKE 1 TABLET BY MOUTH DAILY. 04/18/22 04/18/23  Campbell Riches, MD  diphenhydrAMINE (BENADRYL) 25 MG tablet Take 25 mg by mouth every 6 (six) hours as needed (when taking oxycodone).    [provider]  docusate sodium (COLACE) 100 MG capsule Take 1 capsule (100 mg total) by mouth daily as needed for up to 30 doses. 01/05/23   Janith Lima, MD  hydroxypropyl methylcellulose /  hypromellose (ISOPTO TEARS / GONIOVISC) 2.5 % ophthalmic solution Place 1 drop into both eyes as needed for dry eyes.    [provider]  ibuprofen (ADVIL) 800 MG tablet Take 800 mg by mouth 2 (two) times daily as needed for moderate pain. 05/05/22   [provider]  methocarbamol (ROBAXIN) 500 MG tablet Take 1 tablet (500 mg total) by mouth 4 (four) times daily. 11/27/22   Carlyle Basques, MD  oxyCODONE-acetaminophen (PERCOCET) 5-325 MG tablet Take 1 tablet by mouth every 4 hours as needed for severe pain. 01/05/23   Janith Lima, MD  oxyCODONE-acetaminophen (PERCOCET/ROXICET) 5-325 MG tablet Take 1 tablet by mouth 2 (two) times daily as needed for moderate pain. 12/20/22   [provider]  Polyethylene Glycol 3350 (MIRALAX PO) Take 17 g by mouth daily.    [provider]  sulfamethoxazole-trimethoprim (BACTRIM DS) 800-160 MG tablet Take 1 tablet by mouth daily. 10/24/22 10/24/23  Kuppelweiser, Cassie L, RPH-CPP  tranexamic acid (LYSTEDA) 650 MG TABS tablet Take 2 tablets (1,300 mg total) by mouth 3 (three) times daily. Take during menses for a maximum of five days 10/13/22   Radene Gunning, MD      Allergies    Hydrocodone and Tramadol    Review of Systems   Review of Systems  Physical Exam Updated Vital Signs BP 99/74   Pulse 73   Temp 98 F (36.7 C)   Resp 14   Ht '5\' 4"'$  (1.626  m)   Wt 81.6 kg   LMP 02/01/2023   SpO2 100%   BMI 30.90 kg/m  Physical Exam Vitals and nursing note reviewed.  Constitutional:      General: She is not in acute distress.    Appearance: Normal appearance.     Comments: Tearful, uncomfortable appearing  HENT:     Head: Normocephalic and atraumatic.     Nose: Nose normal.     Mouth/Throat:     Mouth: Mucous membranes are moist.     Pharynx: Oropharynx is clear.  Eyes:     Extraocular Movements: Extraocular movements intact.     Conjunctiva/sclera: Conjunctivae normal.     Pupils: Pupils are equal, round, and  reactive to light.  Cardiovascular:     Rate and Rhythm: Normal rate and regular rhythm.     Heart sounds: Normal heart sounds.  Pulmonary:     Effort: Pulmonary effort is normal.     Breath sounds: Normal breath sounds.  Abdominal:     General: Abdomen is flat.     Palpations: Abdomen is soft.     Tenderness: There is no abdominal tenderness.  Musculoskeletal:        General: Normal range of motion.     Cervical back: Normal range of motion and neck supple. No rigidity.     Right lower leg: No edema.     Left lower leg: No edema.  Skin:    General: Skin is warm and dry.  Neurological:     General: No focal deficit present.     Mental Status: She is alert and oriented to person, place, and time.     Cranial Nerves: No cranial nerve deficit.     Sensory: No sensory deficit.     Motor: No weakness.     Coordination: Coordination normal.  Psychiatric:        Mood and Affect: Mood normal.     ED Results / Procedures / Treatments   Labs (all labs ordered are listed, but only abnormal results are displayed) Labs Reviewed  COMPREHENSIVE METABOLIC PANEL - Abnormal; Notable for the following components:      Result Value   CO2 19 (*)    Total Protein 8.6 (*)    All other components within normal limits  CBC WITH DIFFERENTIAL/PLATELET - Abnormal; Notable for the following components:   RDW 15.6 (*)    All other components within normal limits  LACTIC ACID, PLASMA  POC URINE PREG, ED    EKG None  Radiology MR Brain W and Wo Contrast  Result Date: 02/19/2023 CLINICAL DATA:  Headache. History of CNS toxoplasmosis. HIV with history of prior mass debulking. EXAM: MRI HEAD WITHOUT AND WITH CONTRAST TECHNIQUE: Multiplanar, multiecho pulse sequences of the brain and surrounding structures were obtained without and with intravenous contrast. CONTRAST:  46m GADAVIST GADOBUTROL 1 MMOL/ML IV SOLN COMPARISON:  11/05/2022 FINDINGS: Brain: No acute infarction, hemorrhage, hydrocephalus,  extra-axial collection or mass lesion. Areas of scattered cortically based encephalomalacia along the superficial and inferior right temporal lobe, right occipital pole, and superior left frontal lobe. These areas show no enhancement or swelling. No infarct, hemorrhage, hydrocephalus, or collection. Vascular: Normal flow voids. Skull and upper cervical spine: Normal marrow signal. Sinuses/Orbits: Negative IMPRESSION: No acute or active finding. Stable scattered areas of encephalomalacia when compared to 11/05/2022. Electronically Signed   By: JJorje GuildM.D.   On: 02/19/2023 20:07    Procedures Procedures    Medications Ordered in ED Medications  magnesium sulfate IVPB 1 g 100 mL (1 g Intravenous New Bag/Given 02/19/23 2118)  lactated ringers bolus 1,000 mL (0 mLs Intravenous Stopped 02/19/23 2017)  ketorolac (TORADOL) 15 MG/ML injection 15 mg (15 mg Intravenous Given 02/19/23 1753)  metoCLOPramide (REGLAN) injection 10 mg (10 mg Intravenous Given 02/19/23 1753)  acetaminophen (TYLENOL) tablet 1,000 mg (1,000 mg Oral Given 02/19/23 1753)  gadobutrol (GADAVIST) 1 MMOL/ML injection 8 mL (8 mLs Intravenous Contrast Given 02/19/23 1946)  dexamethasone (DECADRON) injection 4 mg (4 mg Intravenous Given 02/19/23 2035)  diphenhydrAMINE (BENADRYL) injection 25 mg (25 mg Intravenous Given 02/19/23 2035)  prochlorperazine (COMPAZINE) injection 10 mg (10 mg Intravenous Given 02/19/23 2035)  oxyCODONE (Oxy IR/ROXICODONE) immediate release tablet 5 mg (5 mg Oral Given 02/19/23 2035)    ED Course/ Medical Decision Making/ A&P Clinical Course as of 02/19/23 2202  Thu Feb 19, 2023  2018 Patients labs are within normal range, MRI without acute findings. No signs of sepsis on work up. [VK]  2023 Patient reports that her headache initially improved prior to going down to MRI but since being back her headache has started to increase again.  She states that she has mild nausea.  Patient will be given her home oxycodone in  addition to Decadron, Benadryl, Compazine and mag and will have completion of her fluids and she will be reassessed. [VK]  2200 Upon reassessment, the patient reports her headache has improved and she feels comfortable with discharge home.  She was recommended close primary care follow-up and was given strict return precautions. [VK]    Clinical Course User Index [VK] Kemper Durie, DO                             Medical Decision Making This patient presents to the ED with chief complaint(s) of headache with pertinent past medical history of HIV/AIDS, prior toxoplasmosis infection s/p craniectomy with debulking which further complicates the presenting complaint. The complaint involves an extensive differential diagnosis and also carries with it a high risk of complications and morbidity.    The differential diagnosis includes migraine headache, tension headache, ICH or mass effect less likely as patient's headache is not sudden onset, due to her immunocompromise state considering toxoplasmosis, brain abscess or other brain infection, she does not have any fever or meningismus making meningitis less likely, pregnancy, dehydration, electrolyte abnormality  Additional history obtained: Additional history obtained from N/A Records reviewed previous admission documents and infectious disease office records  ED Course and Reassessment: On patient's arrival to the emergency department she is tearful and uncomfortable appearing but in no acute distress.  She has no focal neurologic deficits and noes meningismus on exam and is afebrile here.  However due to her immunocompromise state, she will have labs performed, pregnancy and MRI of the brain with and without contrast.  She will be treated with a migraine cocktail and will be closely reassessed.  Independent labs interpretation:  The following labs were independently interpreted: within normal range  Independent visualization of imaging: - I  independently visualized the following imaging with scope of interpretation limited to determining acute life threatening conditions related to emergency care: MRI brain, which revealed no acute abnormalities  Consultation: - Consulted or discussed management/test interpretation w/ external professional: N/A  Consideration for admission or further workup: Patient has no emergent conditions requiring admission or further work-up at this time and is stable for discharge home with primary care follow-up  Social  Determinants of health: N/A    Amount and/or Complexity of Data Reviewed Labs: ordered. Radiology: ordered.  Risk OTC drugs. Prescription drug management.          Final Clinical Impression(s) / ED Diagnoses Final diagnoses:  Acute nonintractable headache, unspecified headache type    Rx / DC Orders ED Discharge Orders          Ordered    ondansetron (ZOFRAN) 4 MG tablet  Every 6 hours        02/19/23 2202              Kemper Durie, DO 02/19/23 2202

## 2023-02-25 ENCOUNTER — Other Ambulatory Visit (HOSPITAL_COMMUNITY): Payer: Self-pay

## 2023-02-27 ENCOUNTER — Other Ambulatory Visit: Payer: Self-pay

## 2023-03-02 ENCOUNTER — Ambulatory Visit (INDEPENDENT_AMBULATORY_CARE_PROVIDER_SITE_OTHER): Payer: 59 | Admitting: Internal Medicine

## 2023-03-02 ENCOUNTER — Other Ambulatory Visit: Payer: Self-pay

## 2023-03-02 VITALS — BP 121/83 | HR 94 | Temp 98.2°F | Wt 187.0 lb

## 2023-03-02 DIAGNOSIS — G43519 Persistent migraine aura without cerebral infarction, intractable, without status migrainosus: Secondary | ICD-10-CM | POA: Diagnosis not present

## 2023-03-02 DIAGNOSIS — B20 Human immunodeficiency virus [HIV] disease: Secondary | ICD-10-CM | POA: Diagnosis not present

## 2023-03-02 DIAGNOSIS — M25551 Pain in right hip: Secondary | ICD-10-CM

## 2023-03-02 MED ORDER — AMITRIPTYLINE HCL 25 MG PO TABS: 25.0000 mg | ORAL_TABLET | Freq: Every day | ORAL | 1 refills | Status: AC

## 2023-03-02 NOTE — Progress Notes (Signed)
Patient ID: Kelsey Rodriguez, female   DOB: 1991-02-15, 32 y.o.   MRN: [id number]  HPI Cd 4 count of 115/VL 519 (dec 2023) on biktarvy, missed 3 of the last 14 days.  Describes headache all over, throbbing, and neck pain. Did try high dose ibuprofen 800mg  plus pain medication, oxycodone, which helped but when it wears off it comes back up.   Outpatient Encounter Medications as of 03/02/2023  Medication Sig   bictegravir-emtricitabine-tenofovir AF (BIKTARVY) 50-200-25 MG TABS tablet TAKE 1 TABLET BY MOUTH DAILY.   diphenhydrAMINE (BENADRYL) 25 MG tablet Take 25 mg by mouth every 6 (six) hours as needed (when taking oxycodone).   docusate sodium (COLACE) 100 MG capsule Take 1 capsule (100 mg total) by mouth daily as needed for up to 30 doses.   ibuprofen (ADVIL) 800 MG tablet Take 800 mg by mouth 2 (two) times daily as needed for moderate pain.   oxyCODONE-acetaminophen (PERCOCET/ROXICET) 5-325 MG tablet Take 1 tablet by mouth 2 (two) times daily as needed for moderate pain.   sulfamethoxazole-trimethoprim (BACTRIM DS) 800-160 MG tablet Take 1 tablet by mouth daily.   amitriptyline (ELAVIL) 10 MG tablet Take 1 tablet (10 mg total) by mouth at bedtime as needed for sleep (headaches).   hydroxypropyl methylcellulose / hypromellose (ISOPTO TEARS / GONIOVISC) 2.5 % ophthalmic solution Place 1 drop into both eyes as needed for dry eyes.   methocarbamol (ROBAXIN) 500 MG tablet Take 1 tablet (500 mg total) by mouth 4 (four) times daily.   ondansetron (ZOFRAN) 4 MG tablet Take 1 tablet (4 mg total) by mouth every 6 (six) hours.   oxyCODONE-acetaminophen (PERCOCET) 5-325 MG tablet Take 1 tablet by mouth every 4 hours as needed for severe pain.   Polyethylene Glycol 3350 (MIRALAX PO) Take 17 g by mouth daily.   tranexamic acid (LYSTEDA) 650 MG TABS tablet Take 2 tablets (1,300 mg total) by mouth 3 (three) times daily. Take during menses for a maximum of five days   No facility-administered  encounter medications on file as of 03/02/2023.     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 appears well-developed and well-nourished. No distress.  HENT: [place]/AT, PERRLA, no scleral icterus Mouth/Throat: Oropharynx is clear and moist. No oropharyngeal exudate.  Cardiovascular: Normal rate, regular rhythm and normal heart sounds. Exam reveals no gallop and no friction rub.  No murmur heard.  Pulmonary/Chest: Effort normal and breath sounds normal. No respiratory distress.  has no wheezes.  Neck = supple, no nuchal rigidity Abdominal: Soft. Bowel sounds are normal.  exhibits no distension. There is no tenderness.  Lymphadenopathy: no cervical adenopathy. No axillary adenopathy Neurological: alert and oriented to person, place, and time.  Skin: Skin is warm and dry. No rash noted. No erythema.   Psychiatric: a normal mood and affect.  behavior is normal.   Lab Results  Component Value Date   CD4TCELL 9 (L) 11/27/2022   Lab Results  Component Value Date   CD4TABS 115 (L) 11/27/2022   CD4TABS 55 (L) 08/12/2022   CD4TABS 170 (L) 01/02/2022   Lab Results  Component Value Date   HIV1RNAQUANT 519 (H) 11/27/2022   Lab Results  Component Value Date   HEPBSAB POS (A) 09/13/2015   Lab Results  Component Value Date   LABRPR REACTIVE (A) 11/27/2022    CBC Lab Results  Component Value Date   WBC 5.7 02/19/2023   RBC 3.95 02/19/2023   HGB 12.2 02/19/2023   HCT 37.4 02/19/2023   PLT 243 02/19/2023   MCV 94.7 02/19/2023   MCH 30.9 02/19/2023   MCHC 32.6 02/19/2023   RDW 15.6 (H) 02/19/2023   LYMPHSABS 2.1 02/19/2023   MONOABS 0.5 02/19/2023   EOSABS 0.5 02/19/2023    BMET Lab Results  Component Value Date   NA 135 02/19/2023   K 3.9 02/19/2023   CL 107 02/19/2023   CO2 19 (L) 02/19/2023   GLUCOSE 87 02/19/2023   BUN 9 02/19/2023   CREATININE 0.77 02/19/2023   CALCIUM 9.4 02/19/2023   GFRNONAA >60 02/19/2023   GFRAA >60 08/30/2020      Assessment and Plan  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  Describes having daily headaches. Not sure how adherent. Will do 30 day trial and reassess.  Right Hip pain = will see dr Ninfa Linden for evaluation, on April 1st.  Will start with right hip xray

## 2023-03-03 LAB — T-HELPER CELL (CD4) - (RCID CLINIC ONLY)
CD4 % Helper T Cell: 9 % — ABNORMAL LOW (ref 33–65)
CD4 T Cell Abs: 126 /uL — ABNORMAL LOW (ref 400–1790)

## 2023-03-05 ENCOUNTER — Ambulatory Visit
Admission: RE | Admit: 2023-03-05 | Discharge: 2023-03-05 | Disposition: A | Discharge status: Home / Self Care | Payer: 59 | Source: Ambulatory Visit | Attending: Internal Medicine | Admitting: Internal Medicine

## 2023-03-05 DIAGNOSIS — M25551 Pain in right hip: Secondary | ICD-10-CM

## 2023-03-13 LAB — HIV-1 RNA ULTRAQUANT REFLEX TO GENTYP+
HIV 1 RNA Quant: 432 copies/mL — ABNORMAL HIGH
HIV-1 RNA Quant, Log: 2.64 Log copies/mL — ABNORMAL HIGH

## 2023-03-13 LAB — CRYPTOCOCCAL AG, LTX SCR RFLX TITER
Cryptococcal Ag Screen: NOT DETECTED
MICRO NUMBER:: 14707387
SPECIMEN QUALITY:: ADEQUATE

## 2023-03-13 LAB — HIV-1 GENOTYPE: HIV-1 Genotype: DETECTED — AB

## 2023-03-16 ENCOUNTER — Encounter: Payer: Self-pay | Admitting: Orthopaedic Surgery

## 2023-03-16 ENCOUNTER — Other Ambulatory Visit: Payer: Self-pay

## 2023-03-16 ENCOUNTER — Ambulatory Visit (INDEPENDENT_AMBULATORY_CARE_PROVIDER_SITE_OTHER): Payer: 59 | Admitting: Orthopaedic Surgery

## 2023-03-16 DIAGNOSIS — M25551 Pain in right hip: Secondary | ICD-10-CM

## 2023-03-16 DIAGNOSIS — Z96641 Presence of right artificial hip joint: Secondary | ICD-10-CM

## 2023-03-16 NOTE — Progress Notes (Signed)
The patient is well-known to me.  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  We replaced her right hip in 2017.  She has been having intermittent pain in the right hip and she said a few weeks ago it was quite severe and has been in the groin where she can barely put weight on the hip.  She did see Dr. Karolee Ohs who is her ID specialist for HIV.  X-rays of the hip were obtained and I did review those today and they showed no complicating features of the hardware at all.  On my exam today I can easily put her hip to flexion and internal as well as external rotation with no significant difficulties and only some mild pain.  I did review the x-rays with her as well of her right hip.  At this point I would like to obtain an MRI of the right hip to rule out any type of fluid collections around the hip joint or the prosthesis or any other indication of prosthetic loosening or worsening findings.  We are looking for any type of source of her groin pain.  She agrees with this treatment plan.  Will get her back to the office once we have the MRI of her right hip.

## 2023-03-19 ENCOUNTER — Other Ambulatory Visit (HOSPITAL_COMMUNITY): Payer: Self-pay

## 2023-03-26 ENCOUNTER — Other Ambulatory Visit (HOSPITAL_COMMUNITY): Payer: Self-pay

## 2023-03-30 ENCOUNTER — Encounter: Payer: Self-pay | Admitting: Orthopaedic Surgery

## 2023-03-30 ENCOUNTER — Other Ambulatory Visit: Payer: Self-pay

## 2023-04-01 ENCOUNTER — Ambulatory Visit
Admission: RE | Admit: 2023-04-01 | Discharge: 2023-04-01 | Disposition: A | Discharge status: Home / Self Care | Payer: 59 | Source: Ambulatory Visit | Attending: Orthopaedic Surgery | Admitting: Orthopaedic Surgery

## 2023-04-01 DIAGNOSIS — M25551 Pain in right hip: Secondary | ICD-10-CM

## 2023-04-02 ENCOUNTER — Other Ambulatory Visit: Payer: 59

## 2023-04-06 ENCOUNTER — Ambulatory Visit: Payer: 59 | Admitting: Internal Medicine

## 2023-04-17 ENCOUNTER — Other Ambulatory Visit (HOSPITAL_COMMUNITY): Payer: Self-pay

## 2023-04-20 ENCOUNTER — Other Ambulatory Visit (HOSPITAL_COMMUNITY): Payer: Self-pay

## 2023-04-21 ENCOUNTER — Other Ambulatory Visit (HOSPITAL_COMMUNITY): Payer: Self-pay

## 2023-04-27 ENCOUNTER — Other Ambulatory Visit (HOSPITAL_COMMUNITY): Payer: Self-pay

## 2023-04-27 ENCOUNTER — Other Ambulatory Visit: Payer: Self-pay | Admitting: Infectious Diseases

## 2023-04-27 DIAGNOSIS — B20 Human immunodeficiency virus [HIV] disease: Secondary | ICD-10-CM

## 2023-05-05 ENCOUNTER — Other Ambulatory Visit (HOSPITAL_COMMUNITY): Payer: Self-pay

## 2023-05-07 ENCOUNTER — Other Ambulatory Visit: Payer: Self-pay | Admitting: Infectious Diseases

## 2023-05-07 ENCOUNTER — Other Ambulatory Visit (HOSPITAL_COMMUNITY): Payer: Self-pay

## 2023-05-07 DIAGNOSIS — B20 Human immunodeficiency virus [HIV] disease: Secondary | ICD-10-CM

## 2023-05-11 MED ORDER — BIKTARVY 50-200-25 MG PO TABS
1.0000 | ORAL_TABLET | Freq: Every day | ORAL | 11 refills | Status: DC
  Filled 2023-05-11: qty 30, 30d supply, fill #0
  Filled 2023-06-09: qty 30, 30d supply, fill #1

## 2023-05-12 ENCOUNTER — Other Ambulatory Visit: Payer: Self-pay

## 2023-05-12 ENCOUNTER — Other Ambulatory Visit (HOSPITAL_COMMUNITY): Payer: Self-pay

## 2023-05-21 ENCOUNTER — Other Ambulatory Visit: Payer: Self-pay

## 2023-05-26 ENCOUNTER — Other Ambulatory Visit: Payer: Self-pay

## 2023-05-28 ENCOUNTER — Other Ambulatory Visit: Payer: Self-pay | Admitting: Internal Medicine

## 2023-05-28 ENCOUNTER — Other Ambulatory Visit: Payer: Self-pay

## 2023-06-01 ENCOUNTER — Other Ambulatory Visit: Payer: Self-pay

## 2023-06-08 ENCOUNTER — Other Ambulatory Visit (HOSPITAL_COMMUNITY): Payer: Self-pay

## 2023-06-09 ENCOUNTER — Other Ambulatory Visit (HOSPITAL_COMMUNITY): Payer: Self-pay

## 2023-06-10 ENCOUNTER — Other Ambulatory Visit (HOSPITAL_COMMUNITY): Payer: Self-pay

## 2023-07-02 ENCOUNTER — Other Ambulatory Visit (HOSPITAL_COMMUNITY): Payer: Self-pay

## 2023-07-15 ENCOUNTER — Telehealth (HOSPITAL_COMMUNITY): Payer: Self-pay

## 2023-08-07 ENCOUNTER — Encounter (HOSPITAL_COMMUNITY): Payer: Self-pay | Admitting: Physician Assistant

## 2023-08-07 ENCOUNTER — Ambulatory Visit (HOSPITAL_COMMUNITY)
Admission: EM | Admit: 2023-08-07 | Discharge: 2023-08-07 | Disposition: A | Discharge status: Home / Self Care | Payer: 59 | Attending: Physician Assistant | Admitting: Physician Assistant

## 2023-08-07 DIAGNOSIS — R519 Headache, unspecified: Secondary | ICD-10-CM | POA: Diagnosis not present

## 2023-08-07 DIAGNOSIS — M542 Cervicalgia: Secondary | ICD-10-CM | POA: Diagnosis not present

## 2023-08-07 DIAGNOSIS — Z8661 Personal history of infections of the central nervous system: Secondary | ICD-10-CM | POA: Diagnosis not present

## 2023-08-07 DIAGNOSIS — L568 Other specified acute skin changes due to ultraviolet radiation: Secondary | ICD-10-CM

## 2023-08-07 DIAGNOSIS — B20 Human immunodeficiency virus [HIV] disease: Secondary | ICD-10-CM

## 2023-08-07 LAB — POCT INFLUENZA A/B
Influenza A, POC: NEGATIVE
Influenza B, POC: NEGATIVE

## 2023-08-07 NOTE — ED Notes (Signed)
Patient is being discharged from the Urgent Care and sent to the Emergency Department via POV . Per Erin,PA, patient is in need of higher level of care due to aseptic meningitis. Patient is aware and verbalizes understanding of plan of care.  Vitals:   08/07/23 0950  BP: (!) 135/95  Pulse: 88  Resp: 17  Temp: 98.8 F (37.1 C)  SpO2: 98%

## 2023-08-07 NOTE — Discharge Instructions (Signed)
Your flu testing was negative.  As we discussed I am slightly concerned that you might have another case of aseptic meningitis since you are having similar symptoms.  I recommend you go to the emergency room for further evaluation and management.

## 2023-08-07 NOTE — ED Triage Notes (Signed)
Sx 3 days.  Headache, body chills, neck pain, body aches

## 2023-08-07 NOTE — ED Provider Notes (Signed)
[facility]    CSN: [id number] Arrival date & time: 08/07/23  0857      History   Chief Complaint Chief Complaint  Patient presents with   Headache   Generalized Body Aches   Neck Pain    HPI Kelsey Rodriguez is a 32 y.o. female.   Patient presents today with a several day history of URI symptoms including congestion, body aches, headache, neck pain, chills.  Denies any nausea, vomiting, diarrhea.  She stopped taking over-the-counter medication for symptom management.  She reports headache pain is rated 8 on a 0-10 pain scale, localized to her frontal region but wraps around the entirety of her head and into her neck, described as an ache, no alleviating factors identified.  She reports associated photophobia but denies any visual disturbance, focal weakness, dysarthria.  She does have a history of HIV and at her last follow-up with infectious disease she was detectable with a viral load of approximately 432 copies and CD4 count of 126.  She does report compliance with Biktarvy as well as Bactrim for PCP prophylaxis.  Denies additional antibiotics.  She has no concern for pregnancy.    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   Bullae 05/30/2012   Cerebral edema (HCC) 10/28/2019   Cervical dysplasia, mild 12/06/2020   Chronic back pain    Chronic kidney disease    Chronic leg pain    bilateral knees, ankles   Chronic pelvic pain in female 01/04/2019   Complex regional pain syndrome  02/03/2017   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HCC)    Depression    Eczema 02/28/2022   Encephalitis, myelitis, 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   Fatigue    GERD (gastroesophageal reflux disease)    Headache    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   HIV (human immunodeficiency virus infection) (HCC) 02/2012   Hypertension    Laceration of ankle, right 11/18/2012   Lower abdominal pain 06/21/2018   Lumbar radiculopathy    Meningitis 02/18/2018   Pain of upper abdomen    Pelvic pain    PID (acute pelvic inflammatory disease) 02/26/2018   Pneumonia    Reflux esophagitis 03/11/2012   Rotator cuff strain 01/26/2020   S/P craniotomy 05/16/2020   Seizure (HCC)    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   Suicide ideation    Syphilis 02/26/2018   Tendinopathy of left shoulder 01/18/2019   TOA (tubo-ovarian abscess) 04/15/2021   Tuberculosis    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   Tubo-ovarian abscess 04/25/2021   Vertigo    Wears glasses     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HCC)    Seizure (  HCC)    Toxoplasmosis 11/07/2019   AIDS (acquired immune deficiency syndrome) (HCC) 11/07/2019   Chronic pelvic pain in female 01/04/2019   Tuberculosis    Complex regional pain syndrome 02/03/2017   Primary adrenal insufficiency (HCC) 01/03/2015   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    Past Surgical History:  Procedure Laterality Date   APPENDECTOMY  ~ 2000   APPLICATION OF CRANIAL NAVIGATION N/A 05/16/2020   Procedure: APPLICATION OF CRANIAL NAVIGATION;  Surgeon: Tressie Stalker, MD;  Location: [facility] OR;  Service:  Neurosurgery;  Laterality: N/A;   CRANIOTOMY Right 05/16/2020   Procedure: Craniotomy for Resection of Lesion;  Surgeon: Tressie Stalker, MD;  Location: [facility] OR;  Service: Neurosurgery;  Laterality: Right;  right   CYSTOSCOPY WITH RETROGRADE PYELOGRAM, URETEROSCOPY AND STENT PLACEMENT Left 01/05/2023   Procedure: CYSTOSCOPY WITH BILATERAL RETROGRADE PYELOGRAM, LEFT DIAGNOSTIC URETEROSCOPY AND BLADDER BIOPSY;  Surgeon: Jannifer Hick, MD;  Location: [facility] ORS;  Service: Urology;  Laterality: Left;  30 MINUTES NEEDED FOR CASE  ANESTHESIA INTUBATED, PARALYZED   DILATION AND CURETTAGE OF UTERUS  12/15/2006   ESOPHAGOGASTRODUODENOSCOPY  03/11/2012   Procedure: ESOPHAGOGASTRODUODENOSCOPY (EGD);  Surgeon: Hart Carwin, MD;  Location: [facility] ENDOSCOPY;  Service: Endoscopy;  Laterality: N/A;   ESOPHAGOGASTRODUODENOSCOPY N/A 03/07/2014   Procedure: ESOPHAGOGASTRODUODENOSCOPY (EGD);  Surgeon: Iva Boop, MD;  Location: [facility] ENDOSCOPY;  Service: Endoscopy;  Laterality: N/A;   LAPAROSCOPIC OOPHERECTOMY  05/2021   [facility] - LSO and right salpingectomy   LUNG BIOPSY  02/13/2012   TOTAL HIP ARTHROPLASTY Left 12/14/2015   Procedure: LEFT TOTAL HIP ARTHROPLASTY ANTERIOR APPROACH;  Surgeon: Kathryne Hitch, MD;  Location: [facility];  Service: Orthopedics;  Laterality: Left;   TOTAL HIP ARTHROPLASTY Right 04/04/2016   Procedure: RIGHT TOTAL HIP ARTHROPLASTY ANTERIOR APPROACH;  Surgeon: Kathryne Hitch, MD;  Location: [facility];  Service: Orthopedics;  Laterality: Right;    OB History     Gravida  1   Para  0   Term  0   Preterm  0   AB  1   Living  0      SAB  1   IAB  0   Ectopic  0   Multiple  0   Live Births               Home Medications    Prior to Admission medications   Medication Sig Start Date End Date Taking? Authorizing Provider  amitriptyline (ELAVIL) 25 MG tablet Take 1 tablet (25 mg total) by mouth at bedtime. 03/02/23  Yes [name], MD   bictegravir-emtricitabine-tenofovir AF (BIKTARVY) 50-200-25 MG TABS tablet TAKE 1 TABLET BY MOUTH DAILY. 05/11/23 05/10/24 Yes Ginnie Smart, MD  amitriptyline (ELAVIL) 10 MG tablet Take 1 tablet (10 mg total) by mouth at bedtime as needed for sleep (headaches). 11/05/22 01/05/23  Kommor, [place], MD  diphenhydrAMINE (BENADRYL) 25 MG tablet Take 25 mg by mouth every 6 (six) hours as needed (when taking oxycodone).    [provider]  docusate sodium (COLACE) 100 MG capsule Take 1 capsule (100 mg total) by mouth daily as needed for up to 30 doses. 01/05/23   Jannifer Hick, MD  hydroxypropyl methylcellulose / hypromellose (ISOPTO TEARS / GONIOVISC) 2.5 % ophthalmic solution Place 1 drop into both eyes as needed for dry eyes.    [provider]  ibuprofen (ADVIL) 800 MG tablet Take 800 mg by mouth 2 (two) times daily as needed for moderate pain. 05/05/22  [provider]  methocarbamol (ROBAXIN) 500 MG tablet Take 1 tablet (500 mg total) by mouth 4 (four) times daily. 11/27/22   [name], MD  ondansetron (ZOFRAN) 4 MG tablet Take 1 tablet (4 mg total) by mouth every 6 (six) hours. 02/19/23   Rexford Maus, DO  oxyCODONE-acetaminophen (PERCOCET) 5-325 MG tablet Take 1 tablet by mouth every 4 hours as needed for severe pain. 01/05/23   Jannifer Hick, MD  oxyCODONE-acetaminophen (PERCOCET/ROXICET) 5-325 MG tablet Take 1 tablet by mouth 2 (two) times daily as needed for moderate pain. 12/20/22   [provider]  Polyethylene Glycol 3350 (MIRALAX PO) Take 17 g by mouth daily.    [provider]  sulfamethoxazole-trimethoprim (BACTRIM DS) 800-160 MG tablet Take 1 tablet by mouth daily. 10/24/22 10/24/23  Kuppelweiser, Cassie L, RPH-CPP  tranexamic acid (LYSTEDA) 650 MG TABS tablet Take 2 tablets (1,300 mg total) by mouth 3 (three) times daily. Take during menses for a maximum of five days 10/13/22   Milas Hock, MD    Family History Family History   Problem Relation Age of Onset   Heart disease Father        Vague not clearly cardiac   Hypertension Mother     Social History Social History   Tobacco Use   Smoking status: Never   Smokeless tobacco: Never  Vaping Use   Vaping status: Never Used  Substance Use Topics   Alcohol use: Not Currently    Comment: socially   Drug use: No     Allergies   Hydrocodone and Tramadol   Review of Systems Review of Systems  Constitutional:  Positive for activity change, chills and fatigue. Negative for appetite change and fever.  HENT:  Positive for congestion. Negative for sinus pressure, sneezing and sore throat.   Eyes:  Positive for photophobia. Negative for visual disturbance.  Respiratory:  Positive for cough. Negative for shortness of breath.   Gastrointestinal:  Negative for abdominal pain, diarrhea, nausea and vomiting.  Musculoskeletal:  Positive for neck pain. Negative for arthralgias and myalgias.  Neurological:  Positive for headaches. Negative for dizziness and light-headedness.     Physical Exam Triage Vital Signs ED Triage Vitals  Encounter Vitals Group     BP 08/07/23 0950 (!) 135/95     Systolic BP Percentile --      Diastolic BP Percentile --      Pulse Rate 08/07/23 0950 88     Resp 08/07/23 0950 17     Temp 08/07/23 0950 98.8 F (37.1 C)     Temp Source 08/07/23 0950 Oral     SpO2 08/07/23 0950 98 %     Weight 08/07/23 0948 187 lb (84.8 kg)     Height --      Head Circumference --      Peak Flow --      Pain Score 08/07/23 0947 8     Pain Loc --      Pain Education --      Exclude from Growth Chart --    No data found.  Updated Vital Signs BP (!) 135/95 (BP Location: Left Arm)   Pulse 88   Temp 98.8 F (37.1 C) (Oral)   Resp 17   Wt 187 lb (84.8 kg)   SpO2 98%   BMI 32.10 kg/m   Visual Acuity Right Eye Distance:   Left Eye Distance:   Bilateral Distance:    Right Eye Near:   Left Eye Near:  Bilateral Near:     Physical  Exam Vitals reviewed.  Constitutional:      General: She is awake. She is not in acute distress.    Appearance: Normal appearance. She is well-developed. She is not ill-appearing.     Comments: Very pleasant female appears stated age in no acute distress sitting comfortably in exam room  HENT:     Head: Normocephalic and atraumatic.     Right Ear: Tympanic membrane, ear canal and external ear normal. Tympanic membrane is not erythematous or bulging.     Left Ear: Tympanic membrane, ear canal and external ear normal. Tympanic membrane is not erythematous or bulging.     Mouth/Throat:     Pharynx: Uvula midline. No oropharyngeal exudate or posterior oropharyngeal erythema.  Eyes:     Extraocular Movements: Extraocular movements intact.     Conjunctiva/sclera: Conjunctivae normal.     Pupils: Pupils are equal, round, and reactive to light.  Cardiovascular:     Rate and Rhythm: Normal rate and regular rhythm.     Heart sounds: Normal heart sounds, S1 normal and S2 normal. No murmur heard. Pulmonary:     Effort: Pulmonary effort is normal.     Breath sounds: Normal breath sounds. No wheezing, rhonchi or rales.     Comments: Clear to auscultation bilaterally Neurological:     General: No focal deficit present.     Mental Status: She is alert and oriented to person, place, and time.     Cranial Nerves: Cranial nerves 2-12 are intact.     Motor: Motor function is intact.     Coordination: Coordination is intact.     Gait: Gait is intact.     Comments: Cranial nerves II through XII grossly intact.  Psychiatric:        Behavior: Behavior is cooperative.      UC Treatments / Results  Labs (all labs ordered are listed, but only abnormal results are displayed) Labs Reviewed  POCT INFLUENZA A/B    EKG   Radiology No results found.  Procedures Procedures (including critical care time)  Medications Ordered in UC Medications - No data to display  Initial Impression / Assessment  and Plan / UC Course  I have reviewed the triage vital signs and the nursing notes.  Pertinent labs & imaging results that were available during my care of the patient were reviewed by me and considered in my medical decision making (see chart for details).     Patient is well-appearing, afebrile.  Discussed that symptoms are likely related to a viral illness and show she was tested for flu in clinic.  This was negative.  Given her headache, neck pain, photophobia we did discuss that symptoms are similar to meningitis and she does have a history of aseptic meningitis in the past with similar symptoms.  Discussed that given this concern, would recommend going to the emergency room for further evaluation and management since we do not have the capabilities to rule this out.  She was agreeable and will go directly to [facility], ER for further evaluation and management.  All questions were answered patient satisfaction she was agreeable to treatment plan.  Final Clinical Impressions(s) / UC Diagnoses   Final diagnoses:  Neck pain  Photosensitivity  Bad headache  History of meningitis  AIDS [facility])     Discharge Instructions      Your flu testing was negative.  As we discussed I am slightly concerned that you might have another  case of aseptic meningitis since you are having similar symptoms.  I recommend you go to the emergency room for further evaluation and management.     ED Prescriptions   None    PDMP not reviewed this encounter.   [name], PA-C 08/07/23 1057

## 2023-09-28 ENCOUNTER — Other Ambulatory Visit: Payer: 59

## 2023-09-28 ENCOUNTER — Other Ambulatory Visit: Payer: Self-pay

## 2023-09-28 ENCOUNTER — Other Ambulatory Visit (HOSPITAL_COMMUNITY)
Admission: RE | Admit: 2023-09-28 | Discharge: 2023-09-28 | Disposition: A | Discharge status: Home / Self Care | Payer: 59 | Source: Ambulatory Visit | Attending: Internal Medicine | Admitting: Internal Medicine

## 2023-09-28 DIAGNOSIS — Z79899 Other long term (current) drug therapy: Secondary | ICD-10-CM | POA: Diagnosis present

## 2023-09-28 DIAGNOSIS — Z113 Encounter for screening for infections with a predominantly sexual mode of transmission: Secondary | ICD-10-CM

## 2023-09-28 DIAGNOSIS — B20 Human immunodeficiency virus [HIV] disease: Secondary | ICD-10-CM | POA: Insufficient documentation

## 2023-09-29 LAB — URINE CYTOLOGY ANCILLARY ONLY
Chlamydia: NEGATIVE
Comment: NEGATIVE
Comment: NORMAL
Neisseria Gonorrhea: NEGATIVE

## 2023-09-29 LAB — T-HELPER CELL (CD4) - (RCID CLINIC ONLY)
CD4 % Helper T Cell: 3 % — ABNORMAL LOW (ref 33–65)
CD4 T Cell Abs: <35 /uL — ABNORMAL LOW (ref 400–1790)

## 2023-09-30 ENCOUNTER — Telehealth: Payer: Self-pay

## 2023-09-30 NOTE — Telephone Encounter (Signed)
Detectable Viral Load Intervention (DVL)  Most recent VL:  HIV 1 RNA Quant  Date Value Ref Range Status  03/02/2023 432 (H) copies/mL Final  11/27/2022 519 (H) Copies/mL Final  08/12/2022 244,000 (H) Copies/mL Final    Last Clinic Visit: 09/28/23   Current ART regimen: Biktarvy  Appointment status: patient has future appointment scheduled  Medication last dispensed (per chart review):   Dispensed Days Supply Quantity Provider Pharmacy  bictegravir-emtricitabine-tenofovir AF (BIKTARVY) 50-200-25 MG TABS tablet 06/11/2023 30 30 tablet Ginnie Smart, MD [facility]...    Medication Adherence   What pharmacy do you use for your ART? [facility]  Do you pick up your medication at the pharmacy or is it mailed to you? delivered by pharmacy   How often do you miss a dose your ART? 3 weeks   Are you experiencing any side effects with your ART? No   Are you having any trouble remembering what medication(s) you are supposed to take or how you are supposed to take them? No   What helps you remember to take your medication(s)? 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 No   2. Housing instability? No   3. If you are currently employed, are you having difficulty taking time off of work for medical appointments? Sometimes   4. Financial concerns (rent, utilities, etc.)   5. Lack of consistent access to food?   6. Trouble remembering and attending your appointments? no  7. Are you experiencing any other barriers that make it hard for you to come to appointments or take medication regularly? More reminders/    Interventions   Called patient to discuss medication adherence and possible barriers to care.  Patient states that she has been  off medicine for the past three months. Would like to hold off on taking ART until seen by Dr. Drue Second.  Juanita Laster, RMA

## 2023-10-01 LAB — CBC WITH DIFFERENTIAL/PLATELET
Absolute Monocytes: 265 {cells}/uL (ref 200–950)
Basophils Absolute: 0 {cells}/uL (ref 0–200)
Basophils Relative: 0 %
Eosinophils Absolute: 91 {cells}/uL (ref 15–500)
Eosinophils Relative: 3.5 %
HCT: 37 % (ref 35.0–45.0)
Hemoglobin: 12.2 g/dL (ref 11.7–15.5)
Lymphs Abs: 876 {cells}/uL (ref 850–3900)
MCH: 31.1 pg (ref 27.0–33.0)
MCHC: 33 g/dL (ref 32.0–36.0)
MCV: 94.4 fL (ref 80.0–100.0)
MPV: 11.6 fL (ref 7.5–12.5)
Monocytes Relative: 10.2 %
Neutro Abs: 1368 {cells}/uL — ABNORMAL LOW (ref 1500–7800)
Neutrophils Relative %: 52.6 %
Platelets: 162 10*3/uL (ref 140–400)
RBC: 3.92 10*6/uL (ref 3.80–5.10)
RDW: 13.4 % (ref 11.0–15.0)
Total Lymphocyte: 33.7 %
WBC: 2.6 10*3/uL — ABNORMAL LOW (ref 3.8–10.8)

## 2023-10-01 LAB — COMPLETE METABOLIC PANEL WITH GFR
AG Ratio: 1.3 (calc) (ref 1.0–2.5)
ALT: 19 U/L (ref 6–29)
AST: 19 U/L (ref 10–30)
Albumin: 4.7 g/dL (ref 3.6–5.1)
Alkaline phosphatase (APISO): 99 U/L (ref 31–125)
BUN: 10 mg/dL (ref 7–25)
CO2: 24 mmol/L (ref 20–32)
Calcium: 9.5 mg/dL (ref 8.6–10.2)
Chloride: 104 mmol/L (ref 98–110)
Creat: 0.75 mg/dL (ref 0.50–0.97)
Globulin: 3.6 g/dL (ref 1.9–3.7)
Glucose, Bld: 100 mg/dL — ABNORMAL HIGH (ref 65–99)
Potassium: 3.5 mmol/L (ref 3.5–5.3)
Sodium: 139 mmol/L (ref 135–146)
Total Bilirubin: 0.5 mg/dL (ref 0.2–1.2)
Total Protein: 8.3 g/dL — ABNORMAL HIGH (ref 6.1–8.1)
eGFR: 107 mL/min/{1.73_m2} (ref >=60)

## 2023-10-01 LAB — LIPID PANEL
Cholesterol: 210 mg/dL — ABNORMAL HIGH (ref <200)
HDL: 76 mg/dL (ref >=50)
LDL Cholesterol (Calc): 111 mg/dL — ABNORMAL HIGH
Non-HDL Cholesterol (Calc): 134 mg/dL — ABNORMAL HIGH (ref <130)
Total CHOL/HDL Ratio: 2.8 (calc) (ref <5.0)
Triglycerides: 121 mg/dL (ref <150)

## 2023-10-01 LAB — T PALLIDUM AB: T Pallidum Abs: NEGATIVE

## 2023-10-01 LAB — HIV-1 RNA QUANT-NO REFLEX-BLD
HIV 1 RNA Quant: 214000 {copies}/mL — ABNORMAL HIGH
HIV-1 RNA Quant, Log: 5.33 {Log} — ABNORMAL HIGH

## 2023-10-01 LAB — RPR TITER: RPR Titer: 1:1 {titer} — ABNORMAL HIGH

## 2023-10-01 LAB — RPR: RPR Ser Ql: REACTIVE — AB

## 2023-10-07 ENCOUNTER — Ambulatory Visit: Payer: 59 | Admitting: Family Medicine

## 2023-10-07 ENCOUNTER — Other Ambulatory Visit (HOSPITAL_COMMUNITY)
Admission: RE | Admit: 2023-10-07 | Discharge: 2023-10-07 | Disposition: A | Discharge status: Home / Self Care | Payer: 59 | Source: Ambulatory Visit | Attending: Family Medicine | Admitting: Family Medicine

## 2023-10-07 ENCOUNTER — Other Ambulatory Visit: Payer: Self-pay

## 2023-10-07 ENCOUNTER — Encounter: Payer: Self-pay | Admitting: Family Medicine

## 2023-10-07 VITALS — BP 123/85 | HR 81 | Wt 190.3 lb

## 2023-10-07 DIAGNOSIS — N939 Abnormal uterine and vaginal bleeding, unspecified: Secondary | ICD-10-CM | POA: Diagnosis not present

## 2023-10-07 DIAGNOSIS — B20 Human immunodeficiency virus [HIV] disease: Secondary | ICD-10-CM

## 2023-10-07 NOTE — Progress Notes (Signed)
GYNECOLOGY PROBLEM  VISIT ENCOUNTER NOTE  Subjective:   Kelsey Rodriguez is a 32 y.o. [id number] female here for a problem GYN visit.  Patient with a history of HIV, Now AIDS (CD4 <35, VL 214) on biktarvy but as of 10/14 was off for 3 month.  Chief Complaint  Patient presents with   Abnormal bleeding    Current complaints: LMP 9/16. Reports that she will have period and then 1 week after she will have moderate to heavy bleeding for 7-8 days then spotting.  This is the first time she has has continuous bleeding. She does reports a 1-2 year history of spotting and heavy cycles. She was having more blood free weeks but for the past two months she has not had more than 1 week. Not on hormonal control agent now. Previously on depo. Reports bilateral salpingectomy in 2022.   Last visit here was in Nov 2023-- Seen for menorrhagia. Opted for TXA and repeat US  Denies a discharge, pelvic pain, problems with intercourse or other gynecologic concerns.    Gynecologic History Patient's last menstrual period was 08/31/2023 (exact date).  Contraception: tubal ligation  Health Maintenance Due  Topic Date Due   Medicare Annual Wellness (AWV)  Never done   HPV VACCINES (2 - Risk 3-dose SCDM series) 09/06/2018   COVID-19 Vaccine (3 - Pfizer risk series) 06/27/2020   DTaP/Tdap/Td (2 - Td or Tdap) 11/13/2022   INFLUENZA VACCINE  07/16/2023    The following portions of the patient's history were reviewed and updated as appropriate: allergies, current medications, past family history, past medical history, past social history, past surgical history and problem list.  Review of Systems Pertinent items are noted in HPI.   Objective:  BP 123/85   Pulse 81   Wt 190 lb 4.8 oz (86.3 kg)   LMP 08/31/2023 (Exact Date)   BMI 32.66 kg/m  Gen: well appearing, NAD HEENT: no scleral icterus CV: RR Lung: Normal WOB Ext: warm well perfused  PELVIC: Normal appearing external genitalia; normal appearing  vaginal mucosa and cervix.  No abnormal discharge noted.   No blood in vault currently Normal uterine size, no other palpable masses, no uterine or adnexal tenderness.  ENDOMETRIAL BIOPSY     The indications for endometrial biopsy were reviewed.   Risks of the biopsy including cramping, bleeding, infection, uterine perforation, inadequate specimen and need for additional procedures  were discussed. The patient states she understands and agrees to undergo procedure today. Consent was signed. Time out was performed. Urine HCG was negative. During the pelvic exam, the cervix was prepped with Betadine. A single-toothed tenaculum was placed on the anterior lip of the cervix to stabilize it. The 3 mm pipelle was introduced into the endometrial cavity without difficulty to a depth of 8cm, and a moderate amount of tissue was obtained and sent to pathology. The instruments were removed from the patient's vagina. Minimal bleeding from the cervix was noted. The patient tolerated the procedure well. Routine post-procedure instructions were given to the patient.    Assessment and Plan:   1. AIDS (acquired immune deficiency syndrome) (HCC) Has appt 10/28 Is not on ART and plans to restart.  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   2. Abnormal uterine bleeding Longstanding history of AUB Risk factors for endometrial pathology increased BMI, and immunosuppression Now without regular cycle and has had 1-2 months of constant heavy to minimal vaginal bleeding.  Discussed getting  EMB, agrees to procedure today. Consented for verbally and signed. Discussed that results will return in 5-7 days and will help r/o cancer.  Reviewed also getting repeat US to evaluate for changes in fibroids. Prefers appts on Wed AM and Fridays. CMA to help schedule Lengthy discussion about possible IUD. Plans to return for this procedure.  - US PELVIC COMPLETE WITH TRANSVAGINAL; Future -  Surgical pathology( [place]/ [facility])   Please refer to After Visit Summary for other counseling recommendations.   Return in about 4 weeks (around 11/04/2023) for IUD insertion, FU AUB.  Future Appointments  Date Time Provider Department Center  10/12/2023 10:00 AM [name], MD [facility]     Federico Flake, MD, MPH, ABFM Attending Physician Faculty Practice- Center for [facility]

## 2023-10-08 LAB — SURGICAL PATHOLOGY

## 2023-10-12 ENCOUNTER — Ambulatory Visit: Payer: 59 | Admitting: Pharmacist

## 2023-10-12 ENCOUNTER — Other Ambulatory Visit: Payer: Self-pay

## 2023-10-12 ENCOUNTER — Telehealth: Payer: Self-pay

## 2023-10-12 ENCOUNTER — Ambulatory Visit (INDEPENDENT_AMBULATORY_CARE_PROVIDER_SITE_OTHER): Payer: 59 | Admitting: Internal Medicine

## 2023-10-12 ENCOUNTER — Encounter: Payer: Self-pay | Admitting: Internal Medicine

## 2023-10-12 ENCOUNTER — Telehealth: Payer: Self-pay | Admitting: Pharmacist

## 2023-10-12 ENCOUNTER — Other Ambulatory Visit (HOSPITAL_COMMUNITY): Payer: Self-pay

## 2023-10-12 VITALS — BP 126/84 | HR 78 | Temp 98.1°F | Wt 192.0 lb

## 2023-10-12 DIAGNOSIS — B582 Toxoplasma meningoencephalitis: Secondary | ICD-10-CM

## 2023-10-12 DIAGNOSIS — B20 Human immunodeficiency virus [HIV] disease: Secondary | ICD-10-CM | POA: Diagnosis not present

## 2023-10-12 MED ORDER — SULFAMETHOXAZOLE-TRIMETHOPRIM 800-160 MG PO TABS: 1.0000 | ORAL_TABLET | Freq: Every day | ORAL | 11 refills | Status: DC

## 2023-10-12 MED ORDER — AZITHROMYCIN 600 MG PO TABS
1200.0000 mg | ORAL_TABLET | ORAL | 5 refills | Status: DC
Start: 2023-10-12 — End: 2024-01-13

## 2023-10-12 NOTE — Telephone Encounter (Signed)
Patient is interested in starting Guinea. Reviewed chart, previous ART regimens, and lab work. No resistance mutations found. She has historically been noncompliant with her ART and most recent HIV RNA was 214,000 on 09/28/23. She has been off of her Biktarvy for several months. Discussed with Dr. Drue Second and we will recheck a genotype today and if no resistance is found, we will begin monthly Cabenuva injections for her and then transition to every 2 months once she is undetectable (if she gets undetectable). Possibility of adding on Sunlenca in the future as well. Of note, this is not FDA approved to start on patients who are NOT undetectable, but we are running out of options for her as she will not seem to take any oral medications.   Counseled that Guinea is two separate intramuscular injections in the gluteal muscle on each side for each visit. Explained that the second injection is 30 days after the initial injection then every 2 months thereafter. Discussed the rare but significant chance of developing resistance despite compliance. Explained that showing up to injection appointments is very important and warned that if 2 appointments are missed, it will be reassessed by Dr. Drue Second whether she is a good candidate for injection therapy. She does have reliable transportation and the ability to make time for appointments. Counseled on possible side effects associated with the injections such as injection site pain, which is usually mild to moderate in nature, injection site nodules, and injection site reactions. Asked to call the clinic or send me a mychart message if she experiences any issues, such as fatigue, nausea, headache, rash, or dizziness. Advised that she can take ibuprofen or tylenol for injection site pain if needed. She reviewed and signed the [place] contract today.  She will see Dr. Drue Second on 11/10/23 to review genotype and then decide on next moves. She is currently insured with  [employer] and Renaldo Harrison is a $0 copay through [employer] at [place].   Stace Peace L. Rochell Puett, PharmD, BCIDP, AAHIVP, CPP Clinical Pharmacist Practitioner Infectious Diseases Clinical Pharmacist [facility] 10/08/2023, 10:18 AM

## 2023-10-12 NOTE — Telephone Encounter (Addendum)
-----   Message from Federico Flake sent at 10/08/2023  4:51 PM EDT ----- EMB WNL   Called pt; VM left stating I am calling with normal results and please return call with any questions. Per chart review patient was contacted with results via MyChart by provider and has read the message.

## 2023-10-14 ENCOUNTER — Ambulatory Visit (HOSPITAL_COMMUNITY)
Admission: RE | Admit: 2023-10-14 | Discharge: 2023-10-14 | Disposition: A | Discharge status: Home / Self Care | Payer: 59 | Source: Ambulatory Visit | Attending: Family Medicine | Admitting: Family Medicine

## 2023-10-14 DIAGNOSIS — N939 Abnormal uterine and vaginal bleeding, unspecified: Secondary | ICD-10-CM | POA: Insufficient documentation

## 2023-10-24 LAB — HIV-1 INTEGRASE GENOTYPE

## 2023-10-24 LAB — HIV RNA, RTPCR W/R GT (RTI, PI,INT)
HIV 1 RNA Quant: 217000 {copies}/mL — ABNORMAL HIGH
HIV-1 RNA Quant, Log: 5.34 {Log} — ABNORMAL HIGH

## 2023-10-24 LAB — HIV-1 GENOTYPE: HIV-1 Genotype: DETECTED — AB

## 2023-11-02 ENCOUNTER — Other Ambulatory Visit: Payer: Self-pay

## 2023-11-02 ENCOUNTER — Other Ambulatory Visit (HOSPITAL_COMMUNITY): Payer: Self-pay

## 2023-11-02 ENCOUNTER — Other Ambulatory Visit: Payer: Self-pay | Admitting: Pharmacist

## 2023-11-02 DIAGNOSIS — B20 Human immunodeficiency virus [HIV] disease: Secondary | ICD-10-CM

## 2023-11-02 MED ORDER — CABOTEGRAVIR & RILPIVIRINE ER 600 & 900 MG/3ML IM SUER
1.0000 | Freq: Once | INTRAMUSCULAR | 0 refills | Status: AC
  Filled 2023-11-02: qty 6, 30d supply, fill #0

## 2023-11-02 NOTE — Progress Notes (Signed)
Kelsey Rodriguez - I sent in starting dose for her for next week!

## 2023-11-02 NOTE — Progress Notes (Signed)
Specialty Pharmacy Initial Fill Coordination Note  Kelsey Rodriguez is a 31 y.o. female contacted today regarding refills of specialty medication(s) Cabotegravir & Rilpivirine   Patient requested Courier to Provider Office   Delivery date: 11/05/23   Verified address: [facility] [address] [place] [address]   Medication will be filled on 11/04/23   Patient is aware of 0.00 copayment.

## 2023-11-03 ENCOUNTER — Ambulatory Visit: Payer: 59 | Admitting: Internal Medicine

## 2023-11-03 ENCOUNTER — Encounter: Payer: Self-pay | Admitting: Family Medicine

## 2023-11-05 ENCOUNTER — Other Ambulatory Visit (HOSPITAL_COMMUNITY): Payer: Self-pay

## 2023-11-05 ENCOUNTER — Telehealth: Payer: Self-pay

## 2023-11-05 NOTE — Telephone Encounter (Signed)
 [facility] Patient Advocate Encounter  Patient's medications CABENUVA have been couriered to [facility] from [facility] and will be administered at the patients appointment on 11/10/23.  Kae Heller, CPhT Specialty Pharmacy Patient [facility] for Infectious Disease Phone: [phone] Fax:  [phone]

## 2023-11-09 NOTE — Progress Notes (Unsigned)
HPI: Kelsey Rodriguez is a 32 y.o. female who presents to the [facility] pharmacy clinic for [place]y administration.  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   AMITRIPTYLINE (ELAVIL) 25 MG TABLET    Take 1 tablet (25 mg total) by mouth at bedtime.   AZITHROMYCIN (ZITHROMAX) 600 MG TABLET    Take 2 tablets (1,200 mg total) by mouth every 7 (seven) days.   BICTEGRAVIR-EMTRICITABINE-TENOFOVIR AF (BIKTARVY) 50-200-25 MG TABS TABLET    TAKE 1 TABLET BY MOUTH DAILY.   DIPHENHYDRAMINE (BENADRYL) 25 MG TABLET    Take 25 mg by mouth every 6 (six) hours as needed (when taking oxycodone).   DOCUSATE SODIUM (COLACE) 100 MG CAPSULE    Take 1 capsule (100 mg total) by mouth daily as needed for up to 30 doses.   HYDROXYPROPYL METHYLCELLULOSE / HYPROMELLOSE (ISOPTO TEARS / GONIOVISC) 2.5 % OPHTHALMIC SOLUTION    Place 1 drop into both eyes as needed for dry eyes.   IBUPROFEN (ADVIL) 800 MG TABLET    Take 800 mg by mouth 2 (two) times daily as needed for moderate pain.   METHOCARBAMOL (ROBAXIN) 500 MG TABLET    Take 1 tablet (500 mg total) by mouth 4 (four) times daily.   ONDANSETRON (ZOFRAN) 4 MG TABLET    Take 1 tablet (4 mg total) by mouth every 6 (six) hours.    OXYCODONE-ACETAMINOPHEN (PERCOCET) 5-325 MG TABLET    Take 1 tablet by mouth every 4 hours as needed for severe pain.   OXYCODONE-ACETAMINOPHEN (PERCOCET/ROXICET) 5-325 MG TABLET    Take 1 tablet by mouth 2 (two) times daily as needed for moderate pain.   POLYETHYLENE GLYCOL 3350 (MIRALAX PO)    Take 17 g by mouth daily.   SULFAMETHOXAZOLE-TRIMETHOPRIM (BACTRIM DS) 800-160 MG TABLET    Take 1 tablet by mouth daily.   TRANEXAMIC ACID (LYSTEDA) 650 MG TABS TABLET    Take 2 tablets (1,300 mg total) by mouth 3 (three) times daily. Take during menses for a maximum of five days  Modified Medications   No medications on file  Discontinued Medications   No medications on file    Allergies: Allergies  Allergen Reactions   Hydrocodone Itching and Nausea Only    Pt states she can tolerate with benadryl and Tolerates Oxycodone   Tramadol Itching and Nausea Only    Pt states she can tolerate with benadryl and Tolerates oxycodone    Labs: Lab Results  Component Value Date   HIV1RNAQUANT 217,000 (H) 10/12/2023   HIV1RNAQUANT 214,000 (H) 09/28/2023   HIV1RNAQUANT 432 (H) 03/02/2023   CD4TABS <35 (L) 09/28/2023   CD4TABS 126 (L) 03/02/2023   CD4TABS 115 (L) 11/27/2022    RPR and STI Lab Results  Component Value Date   LABRPR REACTIVE (A) 09/28/2023   LABRPR REACTIVE (A) 11/27/2022   LABRPR REACTIVE (A) 08/12/2022   LABRPR Reactive (A) 04/12/2022   LABRPR REACTIVE (A) 02/28/2022   RPRTITER 1:1 (H) 09/28/2023   RPRTITER 1:1 (H) 11/27/2022   RPRTITER 1:1 (H) 08/12/2022   RPRTITER 1:1 (H) 02/28/2022   RPRTITER 1:2 (H) 01/02/2022    STI Results GC CT  09/28/2023 10:51 AM Negative  Negative   03/18/2022 11:17 AM Negative  Negative   01/02/2022  3:50 PM Negative  Negative   04/14/2021 10:35 PM Negative  Negative   11/01/2020  3:18 PM Negative  Negative   08/09/2018 12:00 AM Negative  Negative   02/18/2018 12:00 AM Negative  Negative   02/12/2018 12:00 AM Negative  **POSITIVE**    11/20/2017 12:00 AM Negative  Negative   05/23/2016 12:00 AM Negative  Negative   01/23/2015 12:00 AM NG: Negative  CT: Negative     Hepatitis B Lab Results  Component Value Date   HEPBSAB POS (A) 09/13/2015   HEPBSAG NEGATIVE 03/13/2012   HEPBCAB NEGATIVE 03/13/2012   Hepatitis C Lab Results  Component Value Date   HEPCAB NON-REACTIVE 03/17/2022   Hepatitis A Lab Results  Component Value Date   HAV POSITIVE (A) 03/13/2012   Lipids: Lab Results  Component Value Date   CHOL 210 (H) 09/28/2023   TRIG 121 09/28/2023   HDL 76 09/28/2023   CHOLHDL 2.8 09/28/2023   VLDL 29 12/25/2016   LDLCALC 111 (H) 09/28/2023    Current HIV Regimen: Biktarvy  TARGET DATE: The 26th of the month  Assessment: Aneita presents today for her first initiation injection for Cabenuva. She was also seen by Dr. Drue Second today. Patient has historical non-adherence to her Biktarvy regimen and is interested in switching to Guinea. We will utilize monthly Cabenuva until viral load is controlled, then transition to every 2 month dosing when she is able to get undetectable. Counseled her on the utmost importance of adherence to her Cabenuva schedule. Last RNA on 10/12/23 was 217,000. Will obtain today, and plan to watch closely upon Cabenuva initiation. Her most recent CD4 count was undetectable on 09/28/23 as well. She is prescribed biktarvy for PJP prophylaxis, but has not filled since 12/23/22. Encouraged adherence to this.  Counseled that Guinea is two separate intramuscular injections in the gluteal muscle on each side for each visit. Explained that the second injection is 30 days after the initial injection then every 1 month thereafter until viral load is controlled. Discussed the rare but significant chance of developing resistance despite compliance. Explained that showing up to injection appointments is very important and warned that if 2 appointments are missed, it will be reassessed by their  provider whether they are a good candidate for injection therapy. Counseled on possible side effects associated with the injections such as injection site pain, which is usually mild to moderate in nature, injection site nodules, and injection site reactions. Asked to call the clinic or send me a mychart message if they experience any issues, such as fatigue, nausea, headache, rash, or dizziness. Advised that they can take ibuprofen or tylenol for injection site pain if needed.   Administered cabotegravir 600mg /9mL in left upper outer quadrant of the gluteal muscle. Administered rilpivirine 900 mg/40mL in the right upper outer quadrant of the gluteal muscle. Monitored patient for 10 minutes after injection. Injections were tolerated well without issue. Counseled to stop taking Biktarvy after today's dose and to call with  any issues that may arise. Will make follow up appointments for second initiation injection in 30 days and then maintenance injections every 1 month thereafter.   Plan: - Stop Biktarvy after today's dose - First Cabenuva injections administered - Second initiation injection scheduled for 12/03/23 with Marchelle Folks - Maintenance injections scheduled for 01/08/24 with Marchelle Folks - Will plan to follow up with Dr. Drue Second after January injections - Call with any issues or questions  Lora Paula, PharmD PGY-2 Infectious Diseases Pharmacy Resident [facility] for Infectious Disease

## 2023-11-10 ENCOUNTER — Other Ambulatory Visit: Payer: Self-pay

## 2023-11-10 ENCOUNTER — Ambulatory Visit (INDEPENDENT_AMBULATORY_CARE_PROVIDER_SITE_OTHER): Payer: 59 | Admitting: Pharmacist

## 2023-11-10 ENCOUNTER — Ambulatory Visit: Payer: 59 | Admitting: Internal Medicine

## 2023-11-10 VITALS — BP 132/93 | HR 91 | Temp 97.2°F | Resp 16 | Wt 191.0 lb

## 2023-11-10 DIAGNOSIS — B20 Human immunodeficiency virus [HIV] disease: Secondary | ICD-10-CM | POA: Diagnosis not present

## 2023-11-10 DIAGNOSIS — Z23 Encounter for immunization: Secondary | ICD-10-CM

## 2023-11-10 MED ORDER — CABENUVA 400 & 600 MG/2ML IM SUER
1.0000 | INTRAMUSCULAR | 11 refills | Status: DC
  Filled 2023-11-10 – 2023-11-13 (×2): qty 4, 30d supply, fill #0
  Filled 2023-12-24: qty 4, 30d supply, fill #1
  Filled 2024-01-19: qty 4, 30d supply, fill #2
  Filled 2024-02-24: qty 4, 30d supply, fill #3
  Filled 2024-03-23: qty 4, 30d supply, fill #4
  Filled 2024-04-18: qty 4, 30d supply, fill #5
  Filled 2024-05-30: qty 4, 30d supply, fill #6
  Filled 2024-06-27: qty 4, 30d supply, fill #7
  Filled 2024-07-28: qty 4, 30d supply, fill #8
  Filled 2024-09-02: qty 4, 30d supply, fill #9
  Filled 2024-09-23: qty 4, 30d supply, fill #10
  Filled 2024-10-24: qty 4, 30d supply, fill #11

## 2023-11-10 MED ORDER — CABOTEGRAVIR & RILPIVIRINE ER 600 & 900 MG/3ML IM SUER
1.0000 | Freq: Once | INTRAMUSCULAR | Status: AC
  Administered 2023-11-10: 1 via INTRAMUSCULAR

## 2023-11-10 MED ORDER — SULFAMETHOXAZOLE-TRIMETHOPRIM 800-160 MG PO TABS
1.0000 | ORAL_TABLET | Freq: Every day | ORAL | 11 refills | Status: DC
Start: 2023-11-10 — End: 2024-05-02

## 2023-11-10 NOTE — Progress Notes (Signed)
RFV: follow up for hiv disease Patient ID: Kelsey Rodriguez, female   DOB: August 12, 1991, 32 y.o.   MRN: [id number]  HPI Kelsey Rodriguez is a 32yo F with history of poorly controlled hiv disease, plus CNS toxo. Hx of chronic migraines. Hx of treated mTB, she has been off of biktary as directed in anticipation of changing hiv regimen. Since last visit, she has noticed coating of tongue  Soch hx: Her mom in [place] - she is having poorly controlled seizure disorder  Outpatient Encounter Medications as of 11/10/2023  Medication Sig   amitriptyline (ELAVIL) 25 MG tablet Take 1 tablet (25 mg total) by mouth at bedtime.   azithromycin (ZITHROMAX) 600 MG tablet Take 2 tablets (1,200 mg total) by mouth every 7 (seven) days.   clotrimazole-betamethasone (LOTRISONE) cream Apply topically 2 (two) times daily as needed.   hydroxypropyl methylcellulose / hypromellose (ISOPTO TEARS / GONIOVISC) 2.5 % ophthalmic solution Place 1 drop into both eyes as needed for dry eyes.   ibuprofen (ADVIL) 800 MG tablet Take 800 mg by mouth 2 (two) times daily as needed for moderate pain.   Polyethylene Glycol 3350 (MIRALAX PO) Take 17 g by mouth daily.   amitriptyline (ELAVIL) 10 MG tablet Take 1 tablet (10 mg total) by mouth at bedtime as needed for sleep (headaches).   bictegravir-emtricitabine-tenofovir AF (BIKTARVY) 50-200-25 MG TABS tablet TAKE 1 TABLET BY MOUTH DAILY. (Patient not taking: Reported on 10/07/2023)   diphenhydrAMINE (BENADRYL) 25 MG tablet Take 25 mg by mouth every 6 (six) hours as needed (when taking oxycodone). (Patient not taking: Reported on 10/12/2023)   docusate sodium (COLACE) 100 MG capsule Take 1 capsule (100 mg total) by mouth daily as needed for up to 30 doses. (Patient not taking: Reported on 10/12/2023)   methocarbamol (ROBAXIN) 500 MG tablet Take 1 tablet (500 mg total) by mouth 4 (four) times daily. (Patient not taking: Reported on 10/07/2023)   ondansetron (ZOFRAN) 4 MG tablet Take 1 tablet  (4 mg total) by mouth every 6 (six) hours. (Patient not taking: Reported on 10/12/2023)   oxyCODONE-acetaminophen (PERCOCET) 5-325 MG tablet Take 1 tablet by mouth every 4 hours as needed for severe pain. (Patient not taking: Reported on 10/07/2023)   oxyCODONE-acetaminophen (PERCOCET/ROXICET) 5-325 MG tablet Take 1 tablet by mouth 2 (two) times daily as needed for moderate pain. (Patient not taking: Reported on 10/07/2023)   sulfamethoxazole-trimethoprim (BACTRIM DS) 800-160 MG tablet Take 1 tablet by mouth daily. (Patient not taking: Reported on 11/10/2023)   tranexamic acid (LYSTEDA) 650 MG TABS tablet Take 2 tablets (1,300 mg total) by mouth 3 (three) times daily. Take during menses for a maximum of five days (Patient not taking: Reported on 10/07/2023)   No facility-administered encounter medications on file as of 11/10/2023.     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 appears well-developed and well-nourished. No distress.  HENT: [name]/AT, PERRLA, no scleral icterus Mouth/Throat: Oropharynx is clear and moist. No oropharyngeal exudate.  Cardiovascular: Normal rate, regular rhythm and normal heart sounds. Exam reveals no gallop and no friction rub.  No murmur heard.  Pulmonary/Chest: Effort normal and breath sounds normal. No respiratory distress.  has no wheezes.  Neck = supple, no nuchal rigidity Abdominal: Soft. Bowel sounds are normal.  exhibits no distension. There is no tenderness.  Lymphadenopathy: no cervical adenopathy. No axillary adenopathy Neurological: alert and oriented to person, place, and time.  Skin: Skin is warm and dry. No rash noted. No erythema.  Psychiatric: a normal mood and affect.  behavior is normal.   Lab Results  Component Value Date   CD4TCELL 3 (L) 09/28/2023   Lab Results  Component Value Date   CD4TABS <35 (L) 09/28/2023   CD4TABS 126 (L) 03/02/2023   CD4TABS 115 (L) 11/27/2022   Lab Results  Component Value Date   HIV1RNAQUANT 217,000 (H) 10/12/2023   Lab Results  Component Value Date   HEPBSAB POS (A) 09/13/2015   Lab Results  Component Value Date   LABRPR REACTIVE (A) 09/28/2023    CBC Lab Results  Component Value Date   WBC 2.6 (L) 09/28/2023   RBC 3.92 09/28/2023   HGB 12.2 09/28/2023   HCT 37.0 09/28/2023   PLT 162 09/28/2023   MCV 94.4 09/28/2023   MCH 31.1 09/28/2023   MCHC 33.0 09/28/2023   RDW 13.4 09/28/2023   LYMPHSABS 2.1 02/19/2023   MONOABS 0.5 02/19/2023   EOSABS 91 09/28/2023    BMET Lab Results  Component Value Date   NA 139 09/28/2023   K 3.5 09/28/2023   CL 104 09/28/2023   CO2 24 09/28/2023   GLUCOSE 100 (H) 09/28/2023   BUN 10 09/28/2023   CREATININE 0.75 09/28/2023   CALCIUM 9.5 09/28/2023   GFRNONAA >60 02/19/2023   GFRAA >60 08/30/2020       Assessment and Plan HIV disease= reviewed genotype results and most recent lab resutls, she is a candidate for Cabenuva. For which we will start today. Give recs on schedule. Hx of cns toxo = continue on bactrim Harry leukopenia - anticipate to improve with her CD 4 count improving  Oral hairy leukoplakia +/- thrush = will give short course of fluconazole to see if improves symptoms  Tdap and  hpv start of series; already has flu vaccine; declined covid vaccine

## 2023-11-13 ENCOUNTER — Other Ambulatory Visit: Payer: Self-pay

## 2023-11-13 ENCOUNTER — Other Ambulatory Visit (HOSPITAL_COMMUNITY): Payer: Self-pay

## 2023-11-13 NOTE — Progress Notes (Signed)
Specialty Pharmacy Refill Coordination Note  Kelsey Rodriguez is a 32 y.o. female assessed today regarding refills of clinic administered specialty medication(s) Cabotegravir & Rilpivirine   Clinic requested Courier to Provider Office   Delivery date: 11/27/23   Verified address: [address] [place] [address]   Medication will be filled on 11/26/23.

## 2023-11-15 MED ORDER — FLUCONAZOLE 200 MG PO TABS: 200.0000 mg | ORAL_TABLET | Freq: Every day | ORAL | 0 refills | Status: DC

## 2023-11-17 ENCOUNTER — Other Ambulatory Visit (HOSPITAL_COMMUNITY): Payer: Self-pay

## 2023-11-23 ENCOUNTER — Telehealth: Payer: Self-pay | Admitting: Family Medicine

## 2023-11-23 ENCOUNTER — Other Ambulatory Visit (HOSPITAL_COMMUNITY)
Admission: RE | Admit: 2023-11-23 | Discharge: 2023-11-23 | Disposition: A | Discharge status: Home / Self Care | Payer: 59 | Source: Ambulatory Visit | Attending: Family Medicine | Admitting: Family Medicine

## 2023-11-23 ENCOUNTER — Ambulatory Visit: Payer: 59 | Admitting: Family Medicine

## 2023-11-23 ENCOUNTER — Encounter: Payer: Self-pay | Admitting: Family Medicine

## 2023-11-23 ENCOUNTER — Other Ambulatory Visit: Payer: Self-pay

## 2023-11-23 VITALS — BP 139/96 | HR 91 | Wt 191.0 lb

## 2023-11-23 DIAGNOSIS — Z1151 Encounter for screening for human papillomavirus (HPV): Secondary | ICD-10-CM | POA: Insufficient documentation

## 2023-11-23 DIAGNOSIS — Z01411 Encounter for gynecological examination (general) (routine) with abnormal findings: Secondary | ICD-10-CM | POA: Diagnosis present

## 2023-11-23 DIAGNOSIS — N939 Abnormal uterine and vaginal bleeding, unspecified: Secondary | ICD-10-CM

## 2023-11-23 DIAGNOSIS — N870 Mild cervical dysplasia: Secondary | ICD-10-CM | POA: Diagnosis not present

## 2023-11-23 DIAGNOSIS — Z30430 Encounter for insertion of intrauterine contraceptive device: Secondary | ICD-10-CM | POA: Diagnosis not present

## 2023-11-23 LAB — POCT PREGNANCY, URINE: Preg Test, Ur: NEGATIVE

## 2023-11-23 MED ORDER — LEVONORGESTREL 20.1 MCG/DAY IU IUD: 1.0000 | INTRAUTERINE_SYSTEM | Freq: Once | INTRAUTERINE | Status: DC

## 2023-11-23 MED ORDER — LEVONORGESTREL 20 MCG/DAY IU IUD
1.0000 | INTRAUTERINE_SYSTEM | Freq: Once | INTRAUTERINE | Status: AC
  Administered 2023-11-23: 1 via INTRAUTERINE

## 2023-11-23 NOTE — Progress Notes (Signed)
GYNECOLOGY PROBLEM  VISIT ENCOUNTER NOTE  Subjective:   Kelsey Rodriguez is a 32 y.o. G29P0010 female here for a problem GYN visit.  Patient with HIV (CD4 <35, VL 214, Toxo) Stopped bitkary and now on injectable- Caboregravir/Pilpivirine  Chief Complaint  Patient presents with   Follow-up     Since visit in October 2024: has restarted HIV medication and has been on new medications. EMB was WNL and US showed a small fibroid but otherwise WNL. Had cycle in Nov and has has not had yet this month.   Denies a discharge, pelvic pain, problems with intercourse or other gynecologic concerns.    Gynecologic History Patient's last menstrual period was 10/28/2023.  Contraception: tubal ligation  Health Maintenance Due  Topic Date Due   Medicare Annual Wellness (AWV)  Never done   INFLUENZA VACCINE  07/16/2023   HPV VACCINES (3 - Risk 3-dose SCDM series) 03/09/2024    The following portions of the patient's history were reviewed and updated as appropriate: allergies, current medications, past family history, past medical history, past social history, past surgical history and problem list.  Review of Systems Pertinent items are noted in HPI.   Objective:  BP (!) 139/96   Pulse 91   Wt 191 lb (86.6 kg)   LMP 10/28/2023   BMI 32.79 kg/m  Gen: well appearing, NAD HEENT: no scleral icterus CV: RR Lung: Normal WOB Ext: warm well perfused  PELVIC: Normal appearing external genitalia; normal appearing vaginal mucosa and cervix.  No abnormal discharge noted.   No blood in vault currently Normal uterine size, no other palpable masses, no uterine or adnexal tenderness.  IUD Insertion Procedure Note Patient identified, informed consent performed, consent signed.   Discussed risks of irregular bleeding, cramping, infection, malpositioning or misplacement of the IUD outside the uterus which may require further procedure such as laparoscopy. Time out was performed.  Urine pregnancy test  negative.  Speculum placed in the vagina.  Cervix visualized.  Cleaned with Betadine x 2.  Grasped anteriorly with a single tooth tenaculum.  Uterus sounded to 8 cm.  IUD placed per manufacturer's recommendations.  Strings trimmed to 3 cm. Tenaculum was removed, good hemostasis noted.  Patient tolerated procedure well.     Assessment and Plan:   1. AIDS (acquired immune deficiency syndrome) (HCC) Under care for [facility]   2. Abnormal uterine bleeding Longstanding history of AUB Risk factors for endometrial pathology increased BMI, and immunosuppression EMB showed normal cells US showed a small fibroid  Reviewed options for management- DEPO, IUD, Ablation. She would to try IUD before ablation. Reviewed risks associated with device and placement. Patient had AIDs and there is increased risk of pelvic infections but patient would like to try this prior to going to ablation.  3. Dysplasia of cervix, low grade (CIN 1) - Cytology - PAP( [place])  4. IUD insertion Patient was given post-procedure instructions.  She was advised to have backup contraception for one week.  Patient was also asked to check IUD strings periodically and follow up in 4 weeks for IUD check.   Please refer to After Visit Summary for other counseling recommendations.   Return if symptoms worsen or fail to improve, for IUD string check PRN if cannot feel strings.  Future Appointments  Date Time Provider Department Center  12/03/2023 10:45 AM Jennette Kettle, RPH-CPP [facility]  01/08/2024  9:45 AM Jennette Kettle, RPH-CPP [facility]     Federico Flake, MD, MPH,  ABFM Attending Physician Faculty Practice- [facility]

## 2023-11-23 NOTE — Telephone Encounter (Signed)
Patient needs a String check within 4-6 wks but wants Dr. Alvester Morin and At the moment she has no schedule dqay for [facility], Only [facility].

## 2023-11-25 LAB — CYTOLOGY - PAP
Comment: NEGATIVE
High risk HPV: NEGATIVE

## 2023-11-26 ENCOUNTER — Other Ambulatory Visit: Payer: Self-pay

## 2023-11-27 ENCOUNTER — Telehealth: Payer: Self-pay

## 2023-11-27 NOTE — Telephone Encounter (Signed)
 [facility] Patient Advocate Encounter  Patient's medications CABENUVA have been couriered to [facility] from [facility] and will be administered at the patients appointment on 12/03/23.  Kae Heller, CPhT Specialty Pharmacy Patient [facility] Phone: [phone] Fax:  [phone]

## 2023-12-01 ENCOUNTER — Telehealth: Payer: Self-pay

## 2023-12-01 NOTE — Telephone Encounter (Addendum)
-----   Message from Federico Flake sent at 11/30/2023  2:18 PM EST ----- LSIL, needs colposcopy  Called pt to give opportunity to ask questions. Pt asks for clarification about HPV; briefly discussed result and no need for treatment for this. Agreeable to scheduling colpo. Front office notified.

## 2023-12-03 ENCOUNTER — Ambulatory Visit: Payer: 59 | Admitting: Pharmacist

## 2023-12-03 ENCOUNTER — Other Ambulatory Visit: Payer: Self-pay

## 2023-12-03 DIAGNOSIS — B20 Human immunodeficiency virus [HIV] disease: Secondary | ICD-10-CM | POA: Diagnosis not present

## 2023-12-03 MED ORDER — CABOTEGRAVIR & RILPIVIRINE ER 400 & 600 MG/2ML IM SUER
1.0000 | Freq: Once | INTRAMUSCULAR | Status: AC
  Administered 2023-12-03: 1 via INTRAMUSCULAR

## 2023-12-03 NOTE — Progress Notes (Signed)
HPI: Kelsey Rodriguez is a 32 y.o. female who presents to the [facility] pharmacy clinic for [facility] administration.  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   AMITRIPTYLINE (ELAVIL) 25 MG TABLET    Take 1 tablet (25 mg total) by mouth at bedtime.   AZITHROMYCIN (ZITHROMAX) 600 MG TABLET    Take 2 tablets (1,200 mg total) by mouth every 7 (seven) days.   CABOTEGRAVIR & RILPIVIRINE ER (CABENUVA) 400 & 600 MG/2ML INJECTION    Inject 1 kit into the muscle every 30 (thirty) days.   CLOTRIMAZOLE-BETAMETHASONE (LOTRISONE) CREAM    Apply topically 2 (two) times daily as needed.   DIPHENHYDRAMINE (BENADRYL) 25 MG TABLET    Take 25 mg by mouth every 6 (six) hours as needed (when taking oxycodone).   DOCUSATE SODIUM (COLACE) 100 MG CAPSULE    Take 1 capsule (100 mg total) by mouth daily as needed for up to 30 doses.   FLUCONAZOLE (DIFLUCAN) 200 MG TABLET    Take 1 tablet (200 mg total) by mouth daily.   HYDROXYPROPYL METHYLCELLULOSE / HYPROMELLOSE (ISOPTO TEARS / GONIOVISC) 2.5 % OPHTHALMIC SOLUTION    Place 1 drop into both eyes as needed for dry eyes.   IBUPROFEN (ADVIL) 800 MG TABLET    Take 800 mg by mouth 2  (two) times daily as needed for moderate pain.   METHOCARBAMOL (ROBAXIN) 500 MG TABLET    Take 1 tablet (500 mg total) by mouth 4 (four) times daily.   ONDANSETRON (ZOFRAN) 4 MG TABLET    Take 1 tablet (4 mg total) by mouth every 6 (six) hours.   OXYCODONE-ACETAMINOPHEN (PERCOCET) 5-325 MG TABLET    Take 1 tablet by mouth every 4 hours as needed for severe pain.   OXYCODONE-ACETAMINOPHEN (PERCOCET/ROXICET) 5-325 MG TABLET    Take 1 tablet by mouth 2 (two) times daily as needed for moderate pain.   POLYETHYLENE GLYCOL 3350 (MIRALAX PO)    Take 17 g by mouth daily.   SULFAMETHOXAZOLE-TRIMETHOPRIM (BACTRIM DS) 800-160 MG TABLET    Take 1 tablet by mouth daily.   TRANEXAMIC ACID (LYSTEDA) 650 MG TABS TABLET    Take 2 tablets (1,300 mg total) by mouth 3 (three) times daily. 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   Bullae 05/30/2012   Cerebral edema (HCC) 10/28/2019   Cervical dysplasia, mild 12/06/2020   Chronic back pain    Chronic kidney disease    Chronic leg pain    bilateral knees,  ankles   Chronic pelvic pain in female 01/04/2019   Complex regional pain syndrome 02/03/2017   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HCC)    Depression    Eczema 02/28/2022   Encephalitis, myelitis, 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   Fatigue    GERD (gastroesophageal reflux disease)    Headache    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   HIV (human immunodeficiency virus infection) (HCC) 02/2012   Hypertension    Laceration of ankle, right 11/18/2012   Lower abdominal pain 06/21/2018   Lumbar radiculopathy    Meningitis 02/18/2018   Pain of upper abdomen    Pelvic pain    PID (acute pelvic inflammatory disease) 02/26/2018   Pneumonia    Reflux esophagitis 03/11/2012   Rotator cuff strain 01/26/2020   S/P craniotomy 05/16/2020   Seizure (HCC)    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   Suicide ideation    Syphilis 02/26/2018   Tendinopathy of left shoulder 01/18/2019   TOA (tubo-ovarian abscess) 04/15/2021   Tuberculosis    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   Tubo-ovarian abscess 04/25/2021   Vertigo    Wears glasses     Social History: Social History   Socioeconomic History   Marital status: Married    Spouse name: Not on file   Number of children: 0   Years of education: college   Highest education level: High school graduate  Occupational History   Occupation: CNA  Tobacco Use   Smoking status: Never   Smokeless tobacco: Never  Vaping Use   Vaping status: Never Used  Substance and Sexual Activity   Alcohol use: Not Currently    Comment: socially   Drug use: No   Sexual activity: Yes    Partners: Male    Birth control/protection: None  Other Topics Concern   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     Drinks about 1 soda a day    Social Drivers of Corporate investment banker  Strain: Not on file  Food Insecurity: Food Insecurity Present (09/05/2022)   Hunger Vital Sign    Worried About Running Out of Food in the Last Year: Never true    Ran Out of Food in the Last Year: Sometimes true  Transportation Needs: No Transportation Needs (09/05/2022)   PRAPARE - Administrator, Civil Service (Medical): No    Lack of Transportation (Non-Medical): No  Physical Activity: Not on file  Stress: Not on file  Social Connections: Unknown (04/13/2022)   [facility],  [facility]   Social Network    Social Network: Not on file    Labs: Lab Results  Component Value Date   HIV1RNAQUANT 217,000 (H) 10/12/2023   HIV1RNAQUANT 214,000 (H) 09/28/2023   HIV1RNAQUANT 432 (H) 03/02/2023   CD4TABS <35 (L) 09/28/2023   CD4TABS 126 (L) 03/02/2023   CD4TABS 115 (L) 11/27/2022    RPR and STI Lab Results  Component Value Date   LABRPR REACTIVE (A) 09/28/2023   LABRPR REACTIVE (A) 11/27/2022   LABRPR REACTIVE (A) 08/12/2022   LABRPR Reactive (A) 04/12/2022   LABRPR REACTIVE (A) 02/28/2022   RPRTITER 1:1 (H) 09/28/2023   RPRTITER 1:1 (H) 11/27/2022   RPRTITER 1:1 (H) 08/12/2022   RPRTITER 1:1 (H) 02/28/2022   RPRTITER 1:2 (H) 01/02/2022    STI Results GC CT  09/28/2023 10:51 AM Negative  Negative   03/18/2022 11:17 AM Negative  Negative   01/02/2022  3:50 PM Negative  Negative   04/14/2021 10:35 PM Negative  Negative   11/01/2020  3:18 PM Negative  Negative   08/09/2018 12:00 AM Negative  Negative   02/18/2018 12:00 AM Negative  Negative   02/12/2018 12:00 AM Negative  **POSITIVE**   11/20/2017 12:00 AM Negative  Negative   05/23/2016 12:00 AM Negative  Negative   01/23/2015 12:00 AM NG: Negative  CT: Negative     Hepatitis B Lab Results  Component Value Date   HEPBSAB POS (A) 09/13/2015   HEPBSAG NEGATIVE 03/13/2012   HEPBCAB NEGATIVE 03/13/2012   Hepatitis C Lab Results  Component Value Date   HEPCAB NON-REACTIVE 03/17/2022    Hepatitis A Lab Results  Component Value Date   HAV POSITIVE (A) 03/13/2012   Lipids: Lab Results  Component Value Date   CHOL 210 (H) 09/28/2023   TRIG 121 09/28/2023   HDL 76 09/28/2023   CHOLHDL 2.8 09/28/2023   VLDL 29 12/25/2016   LDLCALC 111 (H) 09/28/2023    TARGET DATE:  The 26th of the month  Assessment: Marina presents today for their maintenance Cabenuva injections. Initial/past injections were tolerated well without issues. No problems with systemic effects of injections. Patient did experience significant soreness for 1 week following injections primarily on the left side. Also palpated knot on the left side and avoided this during administration. Ibuprofen helped relieve some of her soreness. Reviewed her monthly doses will be smaller than her initial loading dose which may help prevent some soreness.   Administered cabotegravir 400mg /47mL in left upper outer quadrant of the gluteal muscle. Administered rilpivirine 600 mg/88mL in the right upper outer quadrant of the gluteal muscle. Monitored patient for 10 minutes after injection. Injections were tolerated well without issue. Patient will follow up in 1 month for next injection. Will check HIV RNA today.  Due for flu, COVID, and Menveo vaccines. Declines Menveo today and states she received flu and COVID through her primary care office. Asked for documentation of this.   Plan: - Cabenuva injections administered - Check HIV RNA - Next injections scheduled for 1/24 and 2/19 with me  - Call with any issues or questions  Margarite Gouge, PharmD, CPP, BCIDP, AAHIVP Clinical Pharmacist Practitioner Infectious Diseases Clinical Pharmacist [facility]

## 2023-12-06 LAB — HIV-1 RNA QUANT-NO REFLEX-BLD
HIV 1 RNA Quant: 225 {copies}/mL — ABNORMAL HIGH
HIV-1 RNA Quant, Log: 2.35 {Log} — ABNORMAL HIGH

## 2023-12-24 ENCOUNTER — Other Ambulatory Visit: Payer: Self-pay

## 2023-12-24 ENCOUNTER — Other Ambulatory Visit (HOSPITAL_COMMUNITY): Payer: Self-pay

## 2023-12-24 NOTE — Progress Notes (Signed)
 Specialty Pharmacy Refill Coordination Note  Kelsey Rodriguez is a 33 y.o. female assessed today regarding refills of clinic administered specialty medication(s) Cabotegravir  & Rilpivirine  (Cabenuva )   Clinic requested Courier to Provider Office   Delivery date: 12/30/23   Verified address: [address] [place] [address]   Medication will be filled on 12/29/23.

## 2023-12-30 ENCOUNTER — Telehealth: Payer: Self-pay

## 2023-12-30 NOTE — Telephone Encounter (Signed)
 [facility] Patient Advocate Encounter  Patient's medications CABENUVA  have been couriered to [facility] from [facility] and will be administered at the patients appointment on 01/08/24.  Verline Glow, CPhT Specialty Pharmacy Patient [facility] Phone: [phone] Fax:  [phone]

## 2024-01-07 NOTE — Progress Notes (Signed)
HPI: Kelsey Rodriguez is a 33 y.o. female who presents to the [facility] pharmacy clinic for [place] administration.  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   AMITRIPTYLINE (ELAVIL) 25 MG TABLET    Take 1 tablet (25 mg total) by mouth at bedtime.   AZITHROMYCIN (ZITHROMAX) 600 MG TABLET    Take 2 tablets (1,200 mg total) by mouth every 7 (seven) days.   CABOTEGRAVIR & RILPIVIRINE ER (CABENUVA) 400 & 600 MG/2ML INJECTION    Inject 1 kit into the muscle every 30 (thirty) days.   CLOTRIMAZOLE-BETAMETHASONE (LOTRISONE) CREAM    Apply topically 2 (two) times daily as needed.   DIPHENHYDRAMINE (BENADRYL) 25 MG TABLET    Take 25 mg by mouth every 6 (six) hours as needed (when taking oxycodone).   DOCUSATE SODIUM (COLACE) 100 MG CAPSULE    Take 1 capsule (100 mg total) by mouth daily as needed for up to 30 doses.   FLUCONAZOLE (DIFLUCAN) 200 MG TABLET    Take 1 tablet (200 mg total) by mouth daily.   HYDROXYPROPYL METHYLCELLULOSE / HYPROMELLOSE (ISOPTO TEARS / GONIOVISC) 2.5 % OPHTHALMIC SOLUTION    Place 1 drop into both eyes as needed for dry eyes.   IBUPROFEN (ADVIL) 800 MG TABLET    Take 800 mg by mouth 2  (two) times daily as needed for moderate pain.   METHOCARBAMOL (ROBAXIN) 500 MG TABLET    Take 1 tablet (500 mg total) by mouth 4 (four) times daily.   ONDANSETRON (ZOFRAN) 4 MG TABLET    Take 1 tablet (4 mg total) by mouth every 6 (six) hours.   OXYCODONE-ACETAMINOPHEN (PERCOCET) 5-325 MG TABLET    Take 1 tablet by mouth every 4 hours as needed for severe pain.   OXYCODONE-ACETAMINOPHEN (PERCOCET/ROXICET) 5-325 MG TABLET    Take 1 tablet by mouth 2 (two) times daily as needed for moderate pain.   POLYETHYLENE GLYCOL 3350 (MIRALAX PO)    Take 17 g by mouth daily.   SULFAMETHOXAZOLE-TRIMETHOPRIM (BACTRIM DS) 800-160 MG TABLET    Take 1 tablet by mouth daily.   TRANEXAMIC ACID (LYSTEDA) 650 MG TABS TABLET    Take 2 tablets (1,300 mg total) by mouth 3 (three) times daily. 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   Bullae 05/30/2012   Cerebral edema (HCC) 10/28/2019   Cervical dysplasia, mild 12/06/2020   Chronic back pain    Chronic kidney disease    Chronic leg pain    bilateral knees,  ankles   Chronic pelvic pain in female 01/04/2019   Complex regional pain syndrome 02/03/2017   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HCC)    Depression    Eczema 02/28/2022   Encephalitis, myelitis, 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   Fatigue    GERD (gastroesophageal reflux disease)    Headache    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   HIV (human immunodeficiency virus infection) (HCC) 02/2012   Hypertension    Laceration of ankle, right 11/18/2012   Lower abdominal pain 06/21/2018   Lumbar radiculopathy    Meningitis 02/18/2018   Pain of upper abdomen    Pelvic pain    PID (acute pelvic inflammatory disease) 02/26/2018   Pneumonia    Reflux esophagitis 03/11/2012   Rotator cuff strain 01/26/2020   S/P craniotomy 05/16/2020   Seizure (HCC)    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   Suicide ideation    Syphilis 02/26/2018   Tendinopathy of left shoulder 01/18/2019   TOA (tubo-ovarian abscess) 04/15/2021   Tuberculosis    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   Tubo-ovarian abscess 04/25/2021   Vertigo    Wears glasses     Social History: Social History   Socioeconomic History   Marital status: Married    Spouse name: Not on file   Number of children: 0   Years of education: college   Highest education level: High school graduate  Occupational History   Occupation: CNA  Tobacco Use   Smoking status: Never   Smokeless tobacco: Never  Vaping Use   Vaping status: Never Used  Substance and Sexual Activity   Alcohol use: Not Currently    Comment: socially   Drug use: No   Sexual activity: Yes    Partners: Male    Birth control/protection: None  Other Topics Concern   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     Drinks about 1 soda a day    Social Drivers of Corporate investment banker  Strain: Not on file  Food Insecurity: Food Insecurity Present (09/05/2022)   Hunger Vital Sign    Worried About Running Out of Food in the Last Year: Never true    Ran Out of Food in the Last Year: Sometimes true  Transportation Needs: No Transportation Needs (09/05/2022)   PRAPARE - Administrator, Civil Service (Medical): No    Lack of Transportation (Non-Medical): No  Physical Activity: Not on file  Stress: Not on file  Social Connections: Unknown (04/13/2022)   Received from [facility]  [facility]   Social Network    Social Network: Not on file    Labs: Lab Results  Component Value Date   HIV1RNAQUANT 225 (H) 12/03/2023   HIV1RNAQUANT 217,000 (H) 10/12/2023   HIV1RNAQUANT 214,000 (H) 09/28/2023   CD4TABS <35 (L) 09/28/2023   CD4TABS 126 (L) 03/02/2023   CD4TABS 115 (L) 11/27/2022    RPR and STI Lab Results  Component Value Date   LABRPR REACTIVE (A) 09/28/2023   LABRPR REACTIVE (A) 11/27/2022   LABRPR REACTIVE (A) 08/12/2022   LABRPR Reactive (A) 04/12/2022   LABRPR REACTIVE (A) 02/28/2022   RPRTITER 1:1 (H) 09/28/2023   RPRTITER 1:1 (H) 11/27/2022   RPRTITER 1:1 (H) 08/12/2022   RPRTITER 1:1 (H) 02/28/2022   RPRTITER 1:2 (H) 01/02/2022    STI Results GC CT  09/28/2023 10:51 AM Negative  Negative   03/18/2022 11:17 AM Negative  Negative   01/02/2022  3:50 PM Negative  Negative   04/14/2021 10:35 PM Negative  Negative   11/01/2020  3:18 PM Negative  Negative   08/09/2018 12:00 AM Negative  Negative   02/18/2018 12:00 AM Negative  Negative   02/12/2018 12:00 AM Negative  **POSITIVE**   11/20/2017 12:00 AM Negative  Negative   05/23/2016 12:00 AM Negative  Negative   01/23/2015 12:00 AM NG: Negative  CT: Negative     Hepatitis B Lab Results  Component Value Date   HEPBSAB POS (A) 09/13/2015   HEPBSAG NEGATIVE 03/13/2012   HEPBCAB NEGATIVE 03/13/2012   Hepatitis C Lab Results  Component Value Date   HEPCAB NON-REACTIVE 03/17/2022    Hepatitis A Lab Results  Component Value Date   HAV POSITIVE (A) 03/13/2012   Lipids: Lab Results  Component Value Date   CHOL 210 (H) 09/28/2023   TRIG 121 09/28/2023   HDL 76 09/28/2023   CHOLHDL 2.8 09/28/2023   VLDL 29 12/25/2016   LDLCALC 111 (H) 09/28/2023    TARGET DATE:  The 26th of the month  Assessment: Kelsey Rodriguez presents today for their maintenance Cabenuva injections. Initial/past injections were tolerated well without issues. No problems with systemic effects of injections. Patient still experienced some tenderness and soreness more prominently on her left side, but it has improved since her first injections. Will trial administering each injection on opposite sides to see if this improves her symptoms.    Administered cabotegravir 400mg /64mL in right upper outer quadrant of the gluteal muscle. Administered rilpivirine 600 mg/66mL in the left upper outer quadrant of the gluteal muscle. Monitored patient for 10 minutes after injection. Injections were tolerated well without issue. Patient will follow up in 2 months for next injection. Will check HIV RNA today and CD4 count at her next visit. Thankfully, viral load has decreased to right above 200. Hopefully it will continue to decrease to fully suppressed within her next couple visits.   Due for flu, COVID, and Menveo vaccinations today which she declines.   Patient states she has experienced loss of appetite over the last 2-3 weeks and would like to resume Ensure which she previously used in the last couple years. She is working with CM Kayai through [facility] but needs Dr. Drue Second to sent prescription for her. Will reach out to Dr. Drue Second for this prescription.   Also confirms she is continuing Bactrim twice daily. Last filled in November, but states she has not missed any doses. Confirms she is currently utilizing her leftover supply as she was on Bactrim before in the last couple years. States  that this supply is active until 2026.    Plan: - Cabenuva injections administered - Continue Bactrim twice daily  - Check HIV RNA - Next injections scheduled for 2/19 with me and 3/19 with Dr. Drue Second  - Call with any issues or questions - Follow up with Dr. Drue Second for Ensure prescription   Margarite Gouge, PharmD, CPP, BCIDP, AAHIVP Clinical Pharmacist Practitioner Infectious Diseases Clinical Pharmacist [facility] for Infectious [facility]

## 2024-01-08 ENCOUNTER — Other Ambulatory Visit: Payer: Self-pay

## 2024-01-08 ENCOUNTER — Ambulatory Visit (INDEPENDENT_AMBULATORY_CARE_PROVIDER_SITE_OTHER): Payer: 59 | Admitting: Pharmacist

## 2024-01-08 ENCOUNTER — Other Ambulatory Visit: Payer: Self-pay | Admitting: Family

## 2024-01-08 DIAGNOSIS — B20 Human immunodeficiency virus [HIV] disease: Secondary | ICD-10-CM | POA: Diagnosis not present

## 2024-01-08 DIAGNOSIS — E46 Unspecified protein-calorie malnutrition: Secondary | ICD-10-CM

## 2024-01-08 MED ORDER — ENSURE PO LIQD
1.0000 | Freq: Two times a day (BID) | ORAL | 11 refills | Status: AC
Start: 2024-01-08 — End: 2025-01-07

## 2024-01-08 MED ORDER — ENSURE PO LIQD: 1.0000 | Freq: Two times a day (BID) | ORAL | 11 refills | Status: DC

## 2024-01-08 MED ORDER — ENSURE PO LIQD: 1.0000 | Freq: Two times a day (BID) | ORAL | 12 refills | Status: DC

## 2024-01-08 MED ORDER — CABOTEGRAVIR & RILPIVIRINE ER 400 & 600 MG/2ML IM SUER
1.0000 | Freq: Once | INTRAMUSCULAR | Status: AC
  Administered 2024-01-08: 1 via INTRAMUSCULAR

## 2024-01-08 NOTE — Addendum Note (Signed)
Addended by: Linna Hoff D on: 01/08/2024 11:44 AM   Modules accepted: Orders

## 2024-01-12 LAB — HIV-1 RNA QUANT-NO REFLEX-BLD
HIV 1 RNA Quant: 84 {copies}/mL — ABNORMAL HIGH
HIV-1 RNA Quant, Log: 1.92 {Log} — ABNORMAL HIGH

## 2024-01-13 ENCOUNTER — Other Ambulatory Visit (HOSPITAL_COMMUNITY)
Admission: RE | Admit: 2024-01-13 | Discharge: 2024-01-13 | Disposition: A | Discharge status: Home / Self Care | Payer: 59 | Source: Ambulatory Visit | Attending: Obstetrics & Gynecology | Admitting: Obstetrics & Gynecology

## 2024-01-13 ENCOUNTER — Ambulatory Visit: Payer: 59 | Admitting: Obstetrics & Gynecology

## 2024-01-13 ENCOUNTER — Other Ambulatory Visit: Payer: Self-pay

## 2024-01-13 VITALS — BP 130/89 | HR 87 | Wt 194.0 lb

## 2024-01-13 DIAGNOSIS — Z1331 Encounter for screening for depression: Secondary | ICD-10-CM | POA: Diagnosis not present

## 2024-01-13 DIAGNOSIS — R87612 Low grade squamous intraepithelial lesion on cytologic smear of cervix (LGSIL): Secondary | ICD-10-CM | POA: Insufficient documentation

## 2024-01-13 NOTE — Progress Notes (Addendum)
    GYNECOLOGY OFFICE COLPOSCOPY PROCEDURE NOTE  33 y.o. G1P0010 here for colposcopy for low-grade squamous intraepithelial neoplasia (LGSIL - encompassing HPV,mild dysplasia,CIN I) pap smear on 11/23/2023. Discussed role for HPV in cervical dysplasia, need for surveillance. Patient is also HIV +, discussed the role of this with regards to cervical dysplasia.  Patient gave informed written consent, time out was performed.  Placed in lithotomy position. Cervix viewed with speculum and colposcope after application of acetic acid.   Colposcopy adequate? Yes . Mirena IUD strings visualized about 2 cm in length outside cervix. Acetowhite lesions noted at  12 and 3 o'clock; corresponding biopsies obtained.  ECC specimen obtained. All specimens were labeled and sent to pathology.  Chaperone was present during entire procedure.  Patient was given post procedure instructions.  Will follow up pathology and manage accordingly; patient will be contacted with results and recommendations.  Routine preventative health maintenance measures emphasized.    [name], MD, FACOG Obstetrician & Gynecologist, [facility] for [employer], [facility]

## 2024-01-13 NOTE — Patient Instructions (Signed)
COLPOSCOPY POST-PROCEDURE INSTRUCTIONS  You may take Ibuprofen, Aleve or Tylenol for cramping if needed.  If Monsel's solution was used, you will have a black discharge.  Light bleeding is normal.  If bleeding is heavier than your period, please call.  Put nothing in your vagina until the bleeding or discharge stops (usually 2 or 3 days).  We will call you within one week with biopsy results or discuss the results at your follow-up appointment if needed.

## 2024-01-14 ENCOUNTER — Encounter: Payer: Self-pay | Admitting: Obstetrics & Gynecology

## 2024-01-14 ENCOUNTER — Telehealth: Payer: Self-pay

## 2024-01-14 LAB — SURGICAL PATHOLOGY

## 2024-01-14 NOTE — Telephone Encounter (Addendum)
-----   Message from [name] sent at 01/14/2024  3:38 PM EST ----- Benign pathology results.  Repeat pap and HPV test in one year recommended. Please call to inform patient of results and recommendations.  Pt notified results and pt verbalized understanding with no further questions.   Kelsey Rodriguez

## 2024-01-19 ENCOUNTER — Other Ambulatory Visit: Payer: Self-pay

## 2024-01-19 ENCOUNTER — Other Ambulatory Visit (HOSPITAL_COMMUNITY): Payer: Self-pay

## 2024-01-19 NOTE — Progress Notes (Signed)
 Specialty Pharmacy Refill Coordination Note  Kelsey Rodriguez is a 33 y.o. female assessed today regarding refills of clinic administered specialty medication(s) Cabotegravir  & Rilpivirine  (Cabenuva )   Clinic requested Courier to Provider Office   Delivery date: 01/29/24   Verified address: [address] [place] [address]   Medication will be filled on 01/28/24.

## 2024-01-28 ENCOUNTER — Other Ambulatory Visit: Payer: Self-pay

## 2024-01-29 ENCOUNTER — Telehealth: Payer: Self-pay

## 2024-01-29 NOTE — Telephone Encounter (Signed)
[facility] Patient Advocate Encounter  Patient's medications CABENUVA have been couriered to [facility] from [facility] pharmacy and will be administered at the patients appointment on 02/03/24.  Kae Heller, CPhT Specialty Pharmacy Patient [facility] for Infectious Disease Phone: [phone] Fax:  [phone]

## 2024-02-03 ENCOUNTER — Other Ambulatory Visit: Payer: Self-pay

## 2024-02-03 ENCOUNTER — Ambulatory Visit (INDEPENDENT_AMBULATORY_CARE_PROVIDER_SITE_OTHER): Payer: 59 | Admitting: Pharmacist

## 2024-02-03 DIAGNOSIS — B20 Human immunodeficiency virus [HIV] disease: Secondary | ICD-10-CM | POA: Diagnosis not present

## 2024-02-03 DIAGNOSIS — Z113 Encounter for screening for infections with a predominantly sexual mode of transmission: Secondary | ICD-10-CM

## 2024-02-03 DIAGNOSIS — Z23 Encounter for immunization: Secondary | ICD-10-CM

## 2024-02-03 DIAGNOSIS — Z9189 Other specified personal risk factors, not elsewhere classified: Secondary | ICD-10-CM

## 2024-02-03 MED ORDER — CABOTEGRAVIR & RILPIVIRINE ER 600 & 900 MG/3ML IM SUER: 1.0000 | Freq: Once | INTRAMUSCULAR | Status: DC

## 2024-02-03 MED ORDER — CABOTEGRAVIR & RILPIVIRINE ER 400 & 600 MG/2ML IM SUER
1.0000 | Freq: Once | INTRAMUSCULAR | Status: AC
  Administered 2024-02-03: 1 via INTRAMUSCULAR

## 2024-02-03 NOTE — Progress Notes (Addendum)
HPI: Kelsey Rodriguez is a 33 y.o. female who presents to the [facility] pharmacy clinic for [place] administration.  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   AMITRIPTYLINE (ELAVIL) 25 MG TABLET    Take 1 tablet (25 mg total) by mouth at bedtime.   CABOTEGRAVIR & RILPIVIRINE ER (CABENUVA) 400 & 600 MG/2ML INJECTION    Inject 1 kit into the muscle every 30 (thirty) days.   CLOTRIMAZOLE-BETAMETHASONE (LOTRISONE) CREAM    Apply topically 2 (two) times daily as needed.   DIPHENHYDRAMINE (BENADRYL) 25 MG TABLET    Take 25 mg by mouth every 6 (six) hours as needed (when taking oxycodone).   DOCUSATE SODIUM (COLACE) 100 MG CAPSULE    Take 1 capsule (100 mg total) by mouth daily as needed for up to 30 doses.   ENSURE (ENSURE)    Take 1 Can by mouth 2 (two) times daily between meals.   HYDROXYPROPYL METHYLCELLULOSE / HYPROMELLOSE (ISOPTO TEARS / GONIOVISC) 2.5 % OPHTHALMIC SOLUTION    Place 1 drop into both eyes as needed for dry eyes.   IBUPROFEN (ADVIL) 800 MG TABLET    Take 800 mg by mouth 2 (two) times daily as needed for moderate pain.   METHOCARBAMOL (ROBAXIN) 500 MG TABLET    Take 1 tablet (500 mg total) by  mouth 4 (four) times daily.   ONDANSETRON (ZOFRAN) 4 MG TABLET    Take 1 tablet (4 mg total) by mouth every 6 (six) hours.   ONDANSETRON (ZOFRAN-ODT) 4 MG DISINTEGRATING TABLET    Take by mouth.   OXYCODONE-ACETAMINOPHEN (PERCOCET) 5-325 MG TABLET    Take 1 tablet by mouth every 4 hours as needed for severe pain.   OXYCODONE-ACETAMINOPHEN (PERCOCET/ROXICET) 5-325 MG TABLET    Take 1 tablet by mouth 2 (two) times daily as needed for moderate pain.   POLYETHYLENE GLYCOL 3350 (MIRALAX PO)    Take 17 g by mouth daily.   SULFAMETHOXAZOLE-TRIMETHOPRIM (BACTRIM DS) 800-160 MG TABLET    Take 1 tablet by mouth daily.   TRANEXAMIC ACID (LYSTEDA) 650 MG TABS TABLET    Take 2 tablets (1,300 mg total) by mouth 3 (three) times daily. Take during menses for a maximum of five days   TRIAMCINOLONE CREAM (KENALOG) 0.1 %    SMARTSIG:1 Application Topical 2-3 Times Daily  Modified Medications   No medications on file  Discontinued Medications   No medications on file    Allergies: Allergies  Allergen Reactions   Hydrocodone Itching and Nausea Only    Pt states she can tolerate with benadryl and Tolerates Oxycodone   Tramadol Itching and Nausea Only    Pt states she can tolerate  with benadryl and Tolerates oxycodone    Labs: Lab Results  Component Value Date   HIV1RNAQUANT 84 (H) 01/08/2024   HIV1RNAQUANT 225 (H) 12/03/2023   HIV1RNAQUANT 217,000 (H) 10/12/2023   CD4TABS <35 (L) 09/28/2023   CD4TABS 126 (L) 03/02/2023   CD4TABS 115 (L) 11/27/2022    RPR and STI Lab Results  Component Value Date   LABRPR REACTIVE (A) 09/28/2023   LABRPR REACTIVE (A) 11/27/2022   LABRPR REACTIVE (A) 08/12/2022   LABRPR Reactive (A) 04/12/2022   LABRPR REACTIVE (A) 02/28/2022   RPRTITER 1:1 (H) 09/28/2023   RPRTITER 1:1 (H) 11/27/2022   RPRTITER 1:1 (H) 08/12/2022   RPRTITER 1:1 (H) 02/28/2022   RPRTITER 1:2 (H) 01/02/2022    STI Results GC CT  09/28/2023 10:51 AM Negative  Negative   03/18/2022 11:17 AM  Negative  Negative   01/02/2022  3:50 PM Negative  Negative   04/14/2021 10:35 PM Negative  Negative   11/01/2020  3:18 PM Negative  Negative   08/09/2018 12:00 AM Negative  Negative   02/18/2018 12:00 AM Negative  Negative   02/12/2018 12:00 AM Negative  **POSITIVE**   11/20/2017 12:00 AM Negative  Negative   05/23/2016 12:00 AM Negative  Negative   01/23/2015 12:00 AM NG: Negative  CT: Negative     Hepatitis B Lab Results  Component Value Date   HEPBSAB POS (A) 09/13/2015   HEPBSAG NEGATIVE 03/13/2012   HEPBCAB NEGATIVE 03/13/2012   Hepatitis C Lab Results  Component Value Date   HEPCAB NON-REACTIVE 03/17/2022   Hepatitis A Lab Results  Component Value Date   HAV POSITIVE (A) 03/13/2012   Lipids: Lab Results  Component Value Date   CHOL 210 (H) 09/28/2023   TRIG 121 09/28/2023   HDL 76 09/28/2023   CHOLHDL 2.8 09/28/2023   VLDL 29 12/25/2016   LDLCALC 111 (H) 09/28/2023    TARGET DATE: March 09, 2024  Assessment: Mckinleigh presents today for her maintenance Cabenuva injections. Past injections were tolerated well without issues. She mentions that the soreness continues to be improving each time. Moving forward, the patient would like to alternate drug location during each visit. Today will be cabotegrevir on the right and rilpivirine on the left side. Last HIV RNA was 84 on 01/08/24. We will check HIV RNA and CD4 count today. She agreed to STI testing today, urine only. She has been taking Bactrim, but misses doses occasionally. She may miss 2 doses every week or so. We discussed eligible vaccines today. She declined COVID vaccine. She mentioned that she received the flu vaccine in Sept 2024 from her PCP. She did agree to receiving both Menveo 2nd dose and HPV 3/3 dose today.   The patient has been drinking Ensure for when she doesn't have an appetite. She mentions that starting Wednesday of last week, she has been having bad headaches and sensitivity to light. She takes  ibuprofen 800 mg for her headaches and when she is having hip pain. Headaches have been improving as of yesterday and today. She will discuss with Dr. Drue Second her concerns about her headaches. Recently, she had been experiencing a cold with cough and chest pain and was prescribed a 5-day course of azithromycin. She completed the full course of antibiotics.   Administered cabotegravir 400mg /75mL in right upper outer quadrant of the gluteal muscle. Administered rilpivirine 600 mg/84mL in the left upper outer quadrant of the gluteal muscle. No issues with injections. She will follow up in 1 month for next  set of injections.  Plan: - Cabenuva injections administered - Next injections scheduled for 03/02/2024 with Dr. Drue Second. - Future injections scheduled for 04/06/2024 with Cassie. - STI testing (urine) today - HPV 3 of 3 doses administered today (right deltoid) - Menveo 2 of 2 doses administered today (left deltoid), followed by a booster every 5 years. - Call with any issues or questions  Buddy Duty, PharmD Student [facility] for Infectious Disease

## 2024-02-04 ENCOUNTER — Encounter: Payer: Self-pay | Admitting: General Practice

## 2024-02-04 LAB — C. TRACHOMATIS/N. GONORRHOEAE RNA
C. trachomatis RNA, TMA: NOT DETECTED
N. gonorrhoeae RNA, TMA: NOT DETECTED

## 2024-02-06 LAB — HIV-1 RNA QUANT-NO REFLEX-BLD
HIV 1 RNA Quant: 121 {copies}/mL — ABNORMAL HIGH
HIV-1 RNA Quant, Log: 2.08 {Log} — ABNORMAL HIGH

## 2024-02-06 LAB — T-HELPER CELLS (CD4) COUNT (NOT AT ARMC)
Absolute CD4: 214 {cells}/uL — ABNORMAL LOW (ref 490–1740)
CD4 T Helper %: 8 % — ABNORMAL LOW (ref 30–61)
Total lymphocyte count: 2548 {cells}/uL (ref 850–3900)

## 2024-02-10 ENCOUNTER — Telehealth: Payer: Self-pay

## 2024-02-10 DIAGNOSIS — B20 Human immunodeficiency virus [HIV] disease: Secondary | ICD-10-CM

## 2024-02-10 NOTE — Telephone Encounter (Signed)
 Contacted the patient today in regards to her recent lab results from 02/03/24. Her viral load increased from 84 to 121. Goal is <50. I explained to the patient that since her viral load has increased, we would like to monitor it more closely and recheck it sooner than her next appointment. We were able to schedule an appointment with lab for Mar 5 at 9:30 am. All questions were answered.

## 2024-02-15 ENCOUNTER — Encounter: Payer: Self-pay | Admitting: Radiology

## 2024-02-15 ENCOUNTER — Ambulatory Visit: Payer: 59 | Admitting: Physician Assistant

## 2024-02-17 ENCOUNTER — Other Ambulatory Visit: Payer: 59

## 2024-02-17 ENCOUNTER — Other Ambulatory Visit: Payer: Self-pay

## 2024-02-17 DIAGNOSIS — B20 Human immunodeficiency virus [HIV] disease: Secondary | ICD-10-CM

## 2024-02-19 LAB — HIV-1 RNA QUANT-NO REFLEX-BLD
HIV 1 RNA Quant: 86 {copies}/mL — ABNORMAL HIGH
HIV-1 RNA Quant, Log: 1.93 {Log_copies}/mL — ABNORMAL HIGH

## 2024-02-24 ENCOUNTER — Other Ambulatory Visit: Payer: Self-pay

## 2024-02-24 ENCOUNTER — Ambulatory Visit (INDEPENDENT_AMBULATORY_CARE_PROVIDER_SITE_OTHER): Admitting: Physician Assistant

## 2024-02-24 ENCOUNTER — Encounter: Payer: Self-pay | Admitting: Physician Assistant

## 2024-02-24 ENCOUNTER — Other Ambulatory Visit (INDEPENDENT_AMBULATORY_CARE_PROVIDER_SITE_OTHER)

## 2024-02-24 ENCOUNTER — Other Ambulatory Visit (HOSPITAL_COMMUNITY): Payer: Self-pay

## 2024-02-24 DIAGNOSIS — Z96642 Presence of left artificial hip joint: Secondary | ICD-10-CM

## 2024-02-24 DIAGNOSIS — M5441 Lumbago with sciatica, right side: Secondary | ICD-10-CM

## 2024-02-24 DIAGNOSIS — G8929 Other chronic pain: Secondary | ICD-10-CM | POA: Diagnosis not present

## 2024-02-24 DIAGNOSIS — Z96641 Presence of right artificial hip joint: Secondary | ICD-10-CM | POA: Diagnosis not present

## 2024-02-24 DIAGNOSIS — M25551 Pain in right hip: Secondary | ICD-10-CM | POA: Diagnosis not present

## 2024-02-24 NOTE — Progress Notes (Signed)
 Office Visit Note   Patient: Kelsey Rodriguez           Date of Birth: 1991-05-02           MRN: [id number] Visit Date: 02/24/2024              Requested by: [facility] [address] [place],  [place] [address] PCP: [facility]   Assessment & Plan: Visit Diagnoses:  1. Chronic right-sided low back pain with right-sided sciatica   2. Pain in right hip   3. History of right hip replacement   4. Status post total replacement of left hip     Plan:  Will send her to formal physical therapy for back pain though work on core strengthening, back exercises, stretching she will gain a home exercise program.  They are to include modalities.  In regards to her right hip pain with flexion for diagnostic and hopefully therapeutic purposes we will send her for a right hip psoas injection with Dr. Shon Baton under ultrasound.  Will have her follow-up with Dr. Magnus Ivan in 6 weeks to see how she is doing overall.  Questions were encouraged and answered at length  Follow-Up Instructions: Return in about 6 weeks (around 04/06/2024).   Orders:  Orders Placed This Encounter  Procedures   XR Lumbar Spine 2-3 Views   No orders of the defined types were placed in this encounter.     Procedures: No procedures performed   Clinical Data: No additional findings.   Subjective: Chief Complaint  Patient presents with   Right Hip - Pain   Right Leg - Pain    HPI Kelsey Rodriguez is well-known to Dr. Eliberto Ivory service with a history of right total hip arthroplasty 2017 and a left total hip arthroplasty 2016.  She has had workup for hip pain in the past.  She states reports that she saw her primary care physician and they thought that her pain from her back may be causing the pain down into her hips.  She is mostly having pain in the right hip region and down to just below the knee.  Describes this is pain and numbness.  Worse with standing and bending over.  She has had no new injuries.  No  recent fevers or chills.  She does note that she has trouble donning shoes and pants on the right leg.  She denies any bowel or bladder changes only some constipation that is chronic.  Denies saddle anesthesia like symptoms or fevers.  She does states she has chills.  She has been taking ibuprofen for the pain. Review of Systems See HPI  Objective: Vital Signs: There were no vitals taken for this visit.  Physical Exam Constitutional:      Appearance: She is not ill-appearing or diaphoretic.  Cardiovascular:     Pulses: Normal pulses.  Pulmonary:     Effort: Pulmonary effort is normal.  Neurological:     Mental Status: She is alert and oriented to person, place, and time.  Psychiatric:        Mood and Affect: Mood normal.     Ortho Exam Lumbar spine: She has full forward flexion and extension.  She has discomfort with forward flexion lumbar spine that goes down into the right hip region.  Also tight hamstrings negative straight leg raise bilaterally. Lower extremities 5 out of 5 strength bilaterally.  Sensation grossly intact bilateral feet to light touch. Bilateral hips: Good range of motion of both hips.  Minimal tenderness over the right hip trochanteric region only.  5-5 strength with hip flexion bilaterally right hip though this causes discomfort.  Specialty Comments:  No specialty comments available.  Imaging: XR Lumbar Spine 2-3 Views Result Date: 02/24/2024 Lumbar spine 2 views: No acute fractures displays well-maintained.  No arthropathic changes.  Normal lordotic curvature.  No spondylolisthesis.  No acute findings.  Status post bilateral hips acetabular components are seen partially on the AP view.  There is no evidence of complicating features of the acetabular components of bilateral hips.    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   Bullae 05/30/2012   Cerebral edema (HCC) 10/28/2019   Cervical dysplasia, mild 12/06/2020   Chronic back pain    Chronic kidney disease    Chronic leg pain    bilateral knees, ankles   Chronic pelvic pain in female 01/04/2019   Complex regional pain syndrome 02/03/2017   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HCC)    Depression    Eczema 02/28/2022   Encephalitis, myelitis, 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   Fatigue    GERD (gastroesophageal reflux disease)    Headache    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   HIV (human immunodeficiency virus infection) (HCC) 02/2012   Hypertension    Laceration of ankle, right 11/18/2012   Lower abdominal pain 06/21/2018   Lumbar radiculopathy    Meningitis 02/18/2018   Pain of upper abdomen    Pelvic pain    PID (acute pelvic inflammatory disease) 02/26/2018   Pneumonia    Reflux esophagitis 03/11/2012   Rotator  cuff strain 01/26/2020   S/P craniotomy 05/16/2020   Seizure (HCC)    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   Suicide ideation    Syphilis 02/26/2018   Tendinopathy of left shoulder 01/18/2019   TOA (tubo-ovarian abscess) 04/15/2021   Tuberculosis    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   Tubo-ovarian abscess 04/25/2021   Vertigo    Wears glasses     Family History  Problem Relation Age of Onset   Heart disease Father        Vague not clearly cardiac   Hypertension Mother     Past Surgical History:  Procedure Laterality Date   APPENDECTOMY  ~  2000   APPLICATION OF CRANIAL NAVIGATION N/A 05/16/2020   Procedure: APPLICATION OF CRANIAL NAVIGATION;  Surgeon: Tressie Stalker, MD;  Location: [facility] OR;  Service: Neurosurgery;  Laterality: N/A;   CRANIOTOMY Right 05/16/2020   Procedure: Craniotomy for Resection of Lesion;  Surgeon: Tressie Stalker, MD;  Location[facility] OR;  Service: Neurosurgery;  Laterality: Right;  right   CYSTOSCOPY WITH RETROGRADE PYELOGRAM, URETEROSCOPY AND STENT PLACEMENT Left 01/05/2023   Procedure: CYSTOSCOPY WITH BILATERAL RETROGRADE PYELOGRAM, LEFT DIAGNOSTIC URETEROSCOPY AND BLADDER BIOPSY;  Surgeon: Jannifer Hick, MD;  Location: [facility] ORS;  Service: Urology;  Laterality: Left;  30 MINUTES NEEDED FOR CASE  ANESTHESIA INTUBATED, PARALYZED   DILATION AND CURETTAGE OF UTERUS  12/15/2006   ESOPHAGOGASTRODUODENOSCOPY  03/11/2012   Procedure: ESOPHAGOGASTRODUODENOSCOPY (EGD);  Surgeon: Hart Carwin, MD;  Location: [facility] ENDOSCOPY;  Service: Endoscopy;  Laterality: N/A;   ESOPHAGOGASTRODUODENOSCOPY N/A 03/07/2014   Procedure: ESOPHAGOGASTRODUODENOSCOPY (EGD);  Surgeon: Iva Boop, MD;  Location[facility] ENDOSCOPY;  Service: Endoscopy;  Laterality: N/A;   LAPAROSCOPIC OOPHERECTOMY  05/2021   [facility] and right salpingectomy   LUNG BIOPSY  02/13/2012   Salpingectomy Left 05/2021   TOTAL  HIP ARTHROPLASTY Left 12/14/2015   Procedure: LEFT TOTAL HIP ARTHROPLASTY ANTERIOR APPROACH;  Surgeon: Kathryne Hitch, MD;  Location: [facility];  Service: Orthopedics;  Laterality: Left;   TOTAL HIP ARTHROPLASTY Right 04/04/2016   Procedure: RIGHT TOTAL HIP ARTHROPLASTY ANTERIOR APPROACH;  Surgeon: Kathryne Hitch, MD;  Location: [facility] ORS;  Service: Orthopedics;  Laterality: Right;   Social History   Occupational History   Occupation: CNA  Tobacco Use   Smoking status: Never   Smokeless tobacco: Never  Vaping Use   Vaping status: Never Used  Substance and Sexual Activity   Alcohol use: Not Currently    Comment: socially   Drug use: No   Sexual activity: Yes    Partners: Male    Birth control/protection: None

## 2024-02-24 NOTE — Progress Notes (Signed)
 Specialty Pharmacy Refill Coordination Note  Kelsey Rodriguez is a 34 y.o. female assessed today regarding refills of clinic administered specialty medication(s) Cabotegravir & Rilpivirine Kelsey Rodriguez)   Clinic requested Courier to Provider Office   Delivery date: 02/29/24   Verified address: [address] [place] [address]   Medication will be filled on 02/26/24.

## 2024-02-25 ENCOUNTER — Other Ambulatory Visit: Payer: Self-pay

## 2024-02-25 DIAGNOSIS — M25551 Pain in right hip: Secondary | ICD-10-CM

## 2024-02-25 DIAGNOSIS — G8929 Other chronic pain: Secondary | ICD-10-CM

## 2024-02-29 ENCOUNTER — Telehealth: Payer: Self-pay

## 2024-02-29 NOTE — Telephone Encounter (Signed)
 [facility] Patient Advocate Encounter  Patient's medications Cabenuva (monthly) have been couriered to [facility] from [employer] and will be administered at the patients appointment on 03/02/24.  Clearance Coots, CPhT Specialty Pharmacy Patient [facility] Phone: [phone] Fax:  [phone]

## 2024-03-02 ENCOUNTER — Encounter: Payer: 59 | Admitting: Internal Medicine

## 2024-03-02 ENCOUNTER — Encounter: Admitting: Pharmacist

## 2024-03-07 ENCOUNTER — Other Ambulatory Visit: Payer: Self-pay

## 2024-03-07 ENCOUNTER — Ambulatory Visit (INDEPENDENT_AMBULATORY_CARE_PROVIDER_SITE_OTHER): Admitting: Sports Medicine

## 2024-03-07 ENCOUNTER — Encounter: Payer: Self-pay | Admitting: Sports Medicine

## 2024-03-07 DIAGNOSIS — M25551 Pain in right hip: Secondary | ICD-10-CM

## 2024-03-07 DIAGNOSIS — Z96641 Presence of right artificial hip joint: Secondary | ICD-10-CM

## 2024-03-07 NOTE — Progress Notes (Signed)
   Procedure Note  Patient: Kelsey Rodriguez             Date of Birth: 22-May-1991           MRN: [id number]             Visit Date: 03/07/2024  Procedures: Visit Diagnoses:  1. Pain in right hip   2. History of right hip replacement    US-guided Iliopsoas Tendon Sheath Injection, Right Hip: After discussion on risk/benefits/indications, an informed verbal consent was obtained. A timeout was then performed. The patient was lying supine on examination table with the affected leg relaxed in neutral position. The area overlying the groin and psoas tendon was prepped with ChloraPrep and multiple alcohol swabs. The ultrasound probe was placed in an oblique plane parallel to the inguinal ligament and superior to femoral head. The overlying soft tissue was anesthesized with 4cc of lidocaine 1%. Using ultrasound guidance via an in-plane approach, a 22-gauge, 3.5" needle was inserted from a lateral to medial direction into the iliopsoas tendon sheath between the tendon and ilium. The tendon sheath was then injected with a mixture of 1:2:1cc of lidocaine:bupivicaine:celestone. Appropriate spread of the injectate within the tendon sheath was visualized with ultrasound guidance. Patient tolerated the procedure well without immediate complications.  A Band-Aid was then applied.    *Ultrasound scan was performed for needle guidance purposes with visualization of the injectate within the iliopsoas bursa.  Procedurally successful ultrasound-guided right iliopsoas tendon sheath injection.  - post-injection protocol discussed  - f/u with [name] on 04/06/24 to evaluate response and discuss next steps  [name], DO Primary Care Sports Medicine Physician  [facility]  This note was dictated using Dragon naturally speaking software and may contain errors in syntax, spelling, or content which have not been identified prior to signing this note.

## 2024-03-09 ENCOUNTER — Ambulatory Visit: Attending: Physician Assistant | Admitting: Physical Therapy

## 2024-03-09 ENCOUNTER — Encounter: Payer: Self-pay | Admitting: Physical Therapy

## 2024-03-09 ENCOUNTER — Other Ambulatory Visit: Payer: Self-pay

## 2024-03-09 DIAGNOSIS — M25551 Pain in right hip: Secondary | ICD-10-CM

## 2024-03-09 DIAGNOSIS — M5441 Lumbago with sciatica, right side: Secondary | ICD-10-CM | POA: Diagnosis present

## 2024-03-09 DIAGNOSIS — G8929 Other chronic pain: Secondary | ICD-10-CM | POA: Diagnosis present

## 2024-03-09 DIAGNOSIS — R262 Difficulty in walking, not elsewhere classified: Secondary | ICD-10-CM | POA: Diagnosis present

## 2024-03-09 NOTE — Therapy (Signed)
 OUTPATIENT PHYSICAL THERAPY THORACOLUMBAR EVALUATION   Patient Name: Kelsey Rodriguez MRN: [id number] DOB:1991/09/09, 33 y.o., female Today's Date: 03/09/2024  END OF SESSION:  PT End of Session - 03/09/24 1110     Visit Number 1    Number of Visits 16    Date for PT Re-Evaluation 05/04/24    Authorization Type U[employer] MCR/MCD    PT Start Time 1016    PT Stop Time 1105    PT Time Calculation (min) 49 min    Activity Tolerance No increased pain;Patient limited by pain    Behavior During Therapy [facility] for tasks assessed/performed             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   Bullae 05/30/2012   Cerebral edema (HCC) 10/28/2019   Cervical dysplasia, mild 12/06/2020   Chronic back pain    Chronic kidney disease    Chronic leg pain    bilateral knees, ankles   Chronic pelvic pain in female 01/04/2019   Complex regional pain syndrome 02/03/2017   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HCC)    Depression    Eczema 02/28/2022   Encephalitis, myelitis, 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   Fatigue    GERD (gastroesophageal reflux disease)    Headache    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   HIV (human immunodeficiency virus infection) (HCC) 02/2012   Hypertension    Laceration of ankle, right 11/18/2012   Lower abdominal pain 06/21/2018   Lumbar radiculopathy    Meningitis 02/18/2018   Pain of upper abdomen    Pelvic pain    PID (acute  pelvic inflammatory disease) 02/26/2018   Pneumonia    Reflux esophagitis 03/11/2012   Rotator cuff strain 01/26/2020   S/P craniotomy 05/16/2020   Seizure (HCC)    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   Suicide ideation    Syphilis 02/26/2018   Tendinopathy of left shoulder 01/18/2019   TOA (tubo-ovarian abscess) 04/15/2021   Tuberculosis    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   Tubo-ovarian abscess 04/25/2021   Vertigo    Wears glasses    Past Surgical History:  Procedure Laterality Date   APPENDECTOMY  ~ 2000   APPLICATION OF CRANIAL NAVIGATION N/A 05/16/2020   Procedure: APPLICATION OF CRANIAL NAVIGATION;  Surgeon: Tressie Stalker, MD;  Location: [facility] OR;  Service: Neurosurgery;  Laterality: N/A;   CRANIOTOMY Right 05/16/2020   Procedure: Craniotomy for Resection of Lesion;  Surgeon: Tressie Stalker, MD;  Location: [facility] OR;  Service: Neurosurgery;  Laterality: Right;  right   CYSTOSCOPY WITH RETROGRADE PYELOGRAM, URETEROSCOPY AND STENT PLACEMENT Left 01/05/2023   Procedure: CYSTOSCOPY WITH BILATERAL RETROGRADE PYELOGRAM, LEFT DIAGNOSTIC URETEROSCOPY AND BLADDER BIOPSY;  Surgeon: Jannifer Hick, MD;  Location: [facility] ORS;  Service: Urology;  Laterality: Left;  30 MINUTES NEEDED FOR CASE  ANESTHESIA INTUBATED, PARALYZED   DILATION AND CURETTAGE OF UTERUS  12/15/2006   ESOPHAGOGASTRODUODENOSCOPY  03/11/2012   Procedure: ESOPHAGOGASTRODUODENOSCOPY (EGD);  Surgeon: Hart Carwin, MD;  Location: [facility] ENDOSCOPY;  Service: Endoscopy;  Laterality: N/A;   ESOPHAGOGASTRODUODENOSCOPY N/A 03/07/2014   Procedure: ESOPHAGOGASTRODUODENOSCOPY (EGD);  Surgeon: Iva Boop, MD;  Location[facility] ENDOSCOPY;  Service: Endoscopy;  Laterality: N/A;   LAPAROSCOPIC OOPHERECTOMY  05/2021   [facility] - LSO and right salpingectomy   LUNG BIOPSY  02/13/2012   Salpingectomy Left 05/2021   TOTAL HIP ARTHROPLASTY Left 12/14/2015    Procedure: LEFT TOTAL HIP ARTHROPLASTY ANTERIOR APPROACH;  Surgeon: Kathryne Hitch, MD;  Location: [facility] ORS;  Service: Orthopedics;  Laterality: Left;   TOTAL HIP ARTHROPLASTY Right 04/04/2016   Procedure: RIGHT TOTAL HIP ARTHROPLASTY ANTERIOR APPROACH;  Surgeon: Kathryne Hitch, MD;  Location: [facility] ORS;  Service: Orthopedics;  Laterality: Right;   Patient Active Problem List   Diagnosis Date Noted   Dysplasia of cervix, low grade (CIN 1) 11/23/2023   Eczema 02/28/2022   S/P craniotomy 05/16/2020   Headache due to intracranial disease 05/09/2020   Hypertension    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HCC)    Seizure (HCC)    Toxoplasmosis 11/07/2019   AIDS (acquired immune deficiency syndrome) (HCC) 11/07/2019   Chronic pelvic pain in female 01/04/2019   Tuberculosis    Complex regional pain syndrome 02/03/2017   Primary adrenal insufficiency (HCC) 01/03/2015   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    PCP: Pa, [facility]   REFERRING PROVIDER: Kirtland Bouchard, PA-C   REFERRING DIAG:  [phone] (ICD-10-CM) - Pain in right hip  M54.41,G89.29 (ICD-10-CM) - Chronic right-sided low back pain with right-sided sciatica    Rationale for Evaluation and Treatment: Rehabilitation  THERAPY DIAG:  Pain in right hip - Plan: PT plan of care cert/re-cert  Chronic right-sided low back pain with right-sided sciatica - Plan: PT plan of care cert/re-cert  Difficulty in walking, not elsewhere classified - Plan: PT plan of care cert/re-cert  ONSET DATE: 3months December 2024  SUBJECTIVE:                                                                                                                                                                                           SUBJECTIVE STATEMENT: I have been having back pain about 2-3 months with a pinching pain in my low back depending on how I bend forward. I cannot stand for  10-15 min.  I always have pain when I bend over especially returning to upright  My second issue is my both my hips are THA because of Avascular necrosis and I  am good with my left hip but my Right hip gives me problems. I recently on Monday March 23rd received  an injection.  I cannot work due to the pain. 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 Headaches, HIV see medical hx  PAIN:  Are you having pain? Yes: NPRS scale: at rest hip with medicine 3-4/10 but at worst  9-10/10, back at rest 0/10 but at worst 8/10 Pain location: Right back and right hip Pain description: sharp pinching pain in back and right hip Aggravating factors: standing for more than 5 min, bending over, cannot stand after 15 min, cannot sleep on Right side with increasing numbness in hip, steps and dressing getting in and out of car Relieving factors: medicine and resting  PRECAUTIONS: Other: HIV  RED FLAGS: None   WEIGHT BEARING RESTRICTIONS: Yes WBAT bil  FALLS:  Has patient fallen in last 6 months? 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  PLOF: Independent  PATIENT GOALS:  be able to walk and stand and sleep more comfortably to complete school and work  NEXT MD VISIT: TBD  OBJECTIVE:  Note: Objective measures were completed at Evaluation unless otherwise noted.  DIAGNOSTIC FINDINGS:  Imaging: XR Lumbar Spine 2-3 Views Result Date: 02/24/2024 Lumbar spine 2 views: No acute fractures displays well-maintained.   No arthropathic changes.  Normal lordotic curvature.  No spondylolisthesis.  No acute findings.  Status post bilateral hips acetabular components are seen partially on the AP view.  There is no evidence of complicating features of the acetabular components of bilateral hips.  PATIENT SURVEYS:  Modified Oswestry 58%   COGNITION: Overall cognitive status: Within functional limits for tasks assessed     SENSATION: WFL  MUSCLE LENGTH: Hamstrings: Right 56 deg; Left 70 deg Thomas test: Right 20 degrees from horizontal  deg; Left 0 from horizontal deg  POSTURE: rounded shoulders, forward head, and weight shift left Left knee more valgus than Right PALPATION: TTP over posterior/lateral iliac crest, marked TTP over SI Right joint and elevated compared to right  LUMBAR ROM:   AROM eval  Flexion Fingertip to mid shin * especially returning to stand  Extension 30% *  Right lateral flexion 30%*  Left lateral flexion 90%  Right rotation 50%  Left rotation 90%   (Blank rows = not tested)  LOWER EXTREMITY ROM:     Active  Right eval Left eval  Hip flexion 90 with pinching pain 105  Hip extension    Hip abduction    Hip adduction    Hip internal rotation    Hip external rotation    Knee flexion 115 132  Knee extension    Ankle dorsiflexion    Ankle plantarflexion    Ankle inversion    Ankle eversion     Key: WFL = within functional limits not formally assessed, * = concordant pain, s = stiffness/stretching sensation, NT = not tested)  LOWER EXTREMITY MMT:    MMT Right eval Left  eval  Hip flexion 3- to 90 degrees only 4+  Hip extension 4- 4+  Hip abduction 3+ 4+  Hip adduction    Hip internal rotation    Hip external rotation    Knee flexion    Knee extension    Ankle dorsiflexion    Ankle plantarflexion 18/25 25/25  Ankle inversion    Ankle eversion    (Blank rows = not tested, score listed is out of 5 possible points.  N = WNL, D = diminished, C = clear for gross  weakness with myotome testing, * = concordant pain with testing)  LUMBAR SPECIAL TESTS:  Straight leg raise test: Negative, Slump test: Negative, and SI Compression/distraction test: Positive Right SI  FUNCTIONAL TESTS:  5 times sit to stand: 14.49 sec 2 minute walk test: 397.2 ft  Minimum norm 579 ft 6 minute walk test: TBD  GAIT: Distance walked: 150 Assistive device utilized: None Level of assistance: Complete Independence Comments: antalgic R gait, wt shift to left  TREATMENT DATE: eval and issue HEP                                                                                                                                 PATIENT EDUCATION:  Education details: POC Explanation of findings, issue HEP Person educated: Patient Education method: Explanation, Demonstration, Tactile cues, Verbal cues, and Handouts Education comprehension: verbalized understanding, returned demonstration, verbal cues required, tactile cues required, and needs further education  HOME EXERCISE PROGRAM: Access Code: [email or web address] URL: [email or web address] Date: 03/09/2024 Prepared by: Garen Lah  Exercises - Hip flexor stretch at edge of bed with SLR resisted  - 2 x daily - 7 x weekly - 1 sets - 3 reps - 30sec hold - Supine Lower Trunk Rotation  - 2 x daily - 7 x weekly - 1 sets - 5 reps - 20 sec hold  ASSESSMENT:  CLINICAL IMPRESSION: Patient is a 33 y.o. female who was seen today for physical therapy evaluation and treatment for Right back and right hip pain with bil THA due to vascular necrosis and in past multiple medical issues.  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   OBJECTIVE IMPAIRMENTS: Abnormal gait, decreased activity tolerance, decreased mobility,  decreased ROM, decreased strength, impaired flexibility, obesity, and pain.   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 Headaches, HIV  see medical hx are also affecting patient's functional outcome.   REHAB POTENTIAL: Good  CLINICAL DECISION MAKING: Evolving/moderate complexity  EVALUATION COMPLEXITY: Moderate   GOALS: Goals reviewed with patient? Yes  SHORT TERM GOALS: Target date: 04-07-24  Pt will be independent with initial HEP Baseline:no knowledge Goal status: INITIAL  2.  Pt will be able to begin walking program to build endurance and tolerance for standing/walking Baseline: unable to stand greater than 5-15 min Goal status: INITIAL  3.   Pt will demonstrate Right lateral and R extension lumbar AROM in order to demonstrate improved tolerance to functional movement patterns.  Baseline: See AROM chart Goal status: INITIAL  4.  Pt will be able to perform 5x STS in 13 seconds or less to show improved LE strength Baseline: eval 14.49 sec Goal status: INITIAL    LONG TERM GOALS: Target date: 05-04-24  Pt will be independent with advanced HEP Baseline: no knowledge Goal status: INITIAL  2.  Pt will be able to dress with 50% greater ease than on eval Baseline: Pt must dress sitting down slowly and carefully at eval Goal status: INITIAL  3.  Pt will be able to sleep with reduced pain and on Right side without awaking due to pain Baseline: sleeps only every 2 hours and needs to reposition Goal status: INITIAL  4.  Pt will improve ODI to at  least 45 % to show improved functional mobility Baseline: 58% eval Goal status: INITIAL  5.  Pt will be able to stand for 30 min to complete cooking/school duties and show improvement in activity tolerance Baseline: Pt cannot stand for more than 5 min without pain Goal status: INITIAL    PLAN:  PT FREQUENCY: 1-2x/week  PT DURATION: 8 weeks  PLANNED INTERVENTIONS: 97164- PT Re-evaluation, 97110-Therapeutic exercises, 97530- Therapeutic activity, 97112- Neuromuscular re-education, 97535- Self Care, 19147- Manual therapy, [id number]- Gait training, [phone]- Electrical stimulation (manual), Patient/Family education, Stair training, Taping, Dry Needling, Joint mobilization, Spinal mobilization, Cryotherapy, and Moist heat.  PLAN FOR NEXT SESSION:  Check R SI joint,  hip flexor stretching/ manual/ TPDN  aquatics   Garen Lah, PT, [facility] Certified Exercise Expert for the Aging Adult  03/09/24 11:52 AM Phone: [phone] Fax: [phone]   Date of referral: 02-25-24 Referring provider: Kirtland Bouchard, PA-C  Referring diagnosis?  M25.551 (ICD-10-CM) - Pain in right hip  M54.41,G89.29 (ICD-10-CM) - Chronic right-sided low back pain with right-sided sciatica   Treatment diagnosis? (if different than referring diagnosis) muscle weakness, SI joint with hip and back pain  What was this (referring dx) caused by? Other: bil THA surgeries and ongoing back pain  Nature of Condition: Chronic (continuous duration > 3 months)   Laterality: Rt  Current Functional Measure Score: Other ODI  58%  Objective measurements identify impairments when they are compared to normal values, the uninvolved extremity, and prior level of function.  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  Cannot stand more than  5-15 min without severe pain  How did symptoms start: Avascular necrosis bil /surgery bil hips and chronic back pain  Average pain intensity:  Last 24 hours: 8-10/10 without medication or injections  Past week: 8-10/10   How often does the pt experience symptoms? Constantly  How much have the symptoms interfered with usual daily  activities? Quite a bit  How has condition changed since care began at this facility? NA - initial visit  In general, how is the patients overall health? Fair multiple medical issues   BACK PAIN (STarT Back Screening Tool) Has pain spread down the leg(s) at some time in the last 2 weeks? yes Has there been pain in the shoulder or neck at some time in the last 2 weeks? yes Has the pt only walked short distances because of back pain? Yes less than 5 min -15 min walking Has patient dressed more slowly because of back pain in the past 2 weeks? yes Does patient think it's not safe for a person with this condition to be physically active? Sometimes worried about falling but has not fallen in last 6 months Does patient have worrying thoughts a lot of the time? yes Does patient feel back pain is terrible and will never get any better? yes Has patient stopped enjoying things they usually enjoy? yes Overall, how bothersome has back pain been in the last 2 weeks?                    Very Much

## 2024-03-09 NOTE — Patient Instructions (Signed)
 Aquatic Therapy at [facility]-  What to Expect!  Where:   [facility] [address] [place], [place] [address] Rehab phone [phone]  NOTE:  You will receive an automated phone message reminding you of your appt and it will say the appointment is at the [facility] clinic.          How to Prepare: Please make sure you drink 8 ounces of water about one hour prior to your pool session A caregiver may attend if needed with the patient to help assist as needed. A caregiver can sit in the pool room on chair. Please arrive IN YOUR SUIT and 15 minutes prior to your appointment - this helps to avoid delays in starting your session. Please make sure to attend to any toileting needs prior to entering the pool [place] rooms for changing are provided.   There is direct access to the pool deck form the locker room.  You can lock your belongings in a locker with lock provided. Once on the pool deck your therapist will ask if you have signed the Patient  Consent and Assignment of Benefits form before beginning treatment Your therapist may take your blood pressure prior to, during and after your session if indicated We usually try and create a home exercise program based on activities we do in the pool.  Please be thinking about who might be able to assist you in the pool should you need to participate in an aquatic home exercise program at the time of discharge if you need assistance.  Some patients do not want to or do not have the ability to participate in an aquatic home program - this is not a barrier in any way to you participating in aquatic therapy as part of your current therapy plan! After Discharge from PT, you can continue using home program at  the [facility], there is a drop-in fee for $5 ($45 a month)or for 60 years  or older $4.00 ($40 a month for seniors ) or any local [employer] pool.  Memberships for purchase are  available for gym/pool at [facility]  IT IS VERY IMPORTANT THAT YOUR LAST VISIT BE IN THE CLINIC AT [facility] [address] AFTER YOUR LAST AQUATIC VISIT.  PLEASE MAKE SURE THAT YOU HAVE A [address]  APPOINTMENT SCHEDULED.   About the pool: Pool is located approximately 500 FT from the entrance of the building.  Please bring a support person if you need assistance traveling this      distance.   Your therapist will assist you in entering the water; there are two ways to           enter: stairs with railings, and a mechanical lift. Your therapist will determine the most appropriate way for you.  Water temperature is usually between 88-90 degrees  There may be up to 2 other swimmers in the pool at the same time  The pool deck is tile, please wear shoes with good traction if you prefer not to be barefoot.    Contact Info:  For appointment scheduling and cancellations:         Please call the [facility]  PH:[phone]              Aquatic Therapy  Outpatient Rehabilitation @ [facility]       All sessions are 45 minutes  Garen Lah, PT, [facility] Certified Exercise Expert for the Aging Adult  03/09/24 11:15 AM Phone: [phone] Fax: [phone]

## 2024-03-11 ENCOUNTER — Other Ambulatory Visit: Payer: Self-pay

## 2024-03-11 ENCOUNTER — Ambulatory Visit: Admitting: Internal Medicine

## 2024-03-11 ENCOUNTER — Encounter: Payer: Self-pay | Admitting: Internal Medicine

## 2024-03-11 VITALS — BP 117/80 | HR 90 | Temp 97.1°F | Resp 16 | Wt 194.0 lb

## 2024-03-11 DIAGNOSIS — R519 Headache, unspecified: Secondary | ICD-10-CM | POA: Diagnosis not present

## 2024-03-11 DIAGNOSIS — Z113 Encounter for screening for infections with a predominantly sexual mode of transmission: Secondary | ICD-10-CM | POA: Insufficient documentation

## 2024-03-11 DIAGNOSIS — Z79899 Other long term (current) drug therapy: Secondary | ICD-10-CM

## 2024-03-11 DIAGNOSIS — B20 Human immunodeficiency virus [HIV] disease: Secondary | ICD-10-CM

## 2024-03-11 DIAGNOSIS — G939 Disorder of brain, unspecified: Secondary | ICD-10-CM

## 2024-03-11 DIAGNOSIS — B589 Toxoplasmosis, unspecified: Secondary | ICD-10-CM

## 2024-03-11 MED ORDER — CABOTEGRAVIR & RILPIVIRINE ER 400 & 600 MG/2ML IM SUER
1.0000 | Freq: Once | INTRAMUSCULAR | Status: AC
  Administered 2024-03-11: 1 via INTRAMUSCULAR

## 2024-03-11 NOTE — Assessment & Plan Note (Signed)
 Will screen today

## 2024-03-11 NOTE — Assessment & Plan Note (Signed)
Will check her lipid panel 

## 2024-03-11 NOTE — Assessment & Plan Note (Signed)
 She has a headache that occurs occasionally with no particular concerning signs.  Does not wake her up from sleep or cause any particular disruption in her daily activities.  However with her previous history of toxoplasmosis and improvement in her immune system with a suppressed virus, I will check an MRI to assure no concerns with reactivation with toxoplasma.  She has refused to go to the emergency room for evaluation.

## 2024-03-11 NOTE — Progress Notes (Signed)
   Subjective:    Patient ID: Kelsey Rodriguez, female    DOB: [birth year], 33 y.o.   MRN: [id number]  HPI Kelsey Rodriguez is here for follow-up of HIV. She has recently been started on [place] and is tolerating this well.  She has had much improved viral suppression and last viral load was just 86 copies.  She feels that she is able to tolerate this well.  She has unfortunately missed some appointments and missed her follow-up 2 weeks ago.  She reports she also has had recent headaches.  She describes the headaches as frontal and with some wrapping around to the occipital area.  She does not wake up with a headache.  No nausea or vomiting associated with it.  When she has a headache she had does note some slight blurry vision.  She reports some photosensitivity with the headaches.   Review of Systems  Constitutional:  Negative for fatigue.  Gastrointestinal:  Negative for diarrhea.       Objective:   Physical Exam Eyes:     General: No scleral icterus. Pulmonary:     Effort: Pulmonary effort is normal.  Neurological:     Mental Status: She is alert.   SH: no tobacco        Assessment & Plan:

## 2024-03-11 NOTE — Assessment & Plan Note (Signed)
 She seems to be doing better with the injectable and medications, though she is late for this appointment and missed her previous appointment with Dr. Drue Second.  Otherwise we will give her injections today and check her labs including her CD4 count today.  She otherwise has follow-up next month and will continue with the monthly injections

## 2024-03-12 LAB — PREGNANCY, URINE: Preg Test, Ur: NEGATIVE

## 2024-03-12 LAB — C. TRACHOMATIS/N. GONORRHOEAE RNA
C. trachomatis RNA, TMA: NOT DETECTED
N. gonorrhoeae RNA, TMA: NOT DETECTED

## 2024-03-15 LAB — RPR TITER: RPR Titer: 1:1 {titer} — ABNORMAL HIGH

## 2024-03-15 LAB — CBC WITH DIFFERENTIAL/PLATELET
Absolute Lymphocytes: 1898 {cells}/uL (ref 850–3900)
Absolute Monocytes: 547 {cells}/uL (ref 200–950)
Basophils Absolute: 40 {cells}/uL (ref 0–200)
Basophils Relative: 0.7 %
Eosinophils Absolute: 291 {cells}/uL (ref 15–500)
Eosinophils Relative: 5.1 %
HCT: 38.9 % (ref 35.0–45.0)
Hemoglobin: 13.1 g/dL (ref 11.7–15.5)
MCH: 31 pg (ref 27.0–33.0)
MCHC: 33.7 g/dL (ref 32.0–36.0)
MCV: 92.2 fL (ref 80.0–100.0)
MPV: 10.7 fL (ref 7.5–12.5)
Monocytes Relative: 9.6 %
Neutro Abs: 2924 {cells}/uL (ref 1500–7800)
Neutrophils Relative %: 51.3 %
Platelets: 312 10*3/uL (ref 140–400)
RBC: 4.22 10*6/uL (ref 3.80–5.10)
RDW: 14.3 % (ref 11.0–15.0)
Total Lymphocyte: 33.3 %
WBC: 5.7 10*3/uL (ref 3.8–10.8)

## 2024-03-15 LAB — RPR: RPR Ser Ql: REACTIVE — AB

## 2024-03-15 LAB — COMPLETE METABOLIC PANEL WITHOUT GFR
AG Ratio: 1.3 (calc) (ref 1.0–2.5)
ALT: 16 U/L (ref 6–29)
AST: 13 U/L (ref 10–30)
Albumin: 4.4 g/dL (ref 3.6–5.1)
Alkaline phosphatase (APISO): 81 U/L (ref 31–125)
BUN: 11 mg/dL (ref 7–25)
CO2: 26 mmol/L (ref 20–32)
Calcium: 9.6 mg/dL (ref 8.6–10.2)
Chloride: 103 mmol/L (ref 98–110)
Creat: 0.88 mg/dL (ref 0.50–0.97)
Globulin: 3.5 g/dL (ref 1.9–3.7)
Glucose, Bld: 77 mg/dL (ref 65–99)
Potassium: 3.8 mmol/L (ref 3.5–5.3)
Sodium: 138 mmol/L (ref 135–146)
Total Bilirubin: 0.4 mg/dL (ref 0.2–1.2)
Total Protein: 7.9 g/dL (ref 6.1–8.1)

## 2024-03-15 LAB — LIPID PANEL
Cholesterol: 228 mg/dL — ABNORMAL HIGH (ref <200)
HDL: 72 mg/dL (ref >=50)
LDL Cholesterol (Calc): 134 mg/dL — ABNORMAL HIGH
Non-HDL Cholesterol (Calc): 156 mg/dL — ABNORMAL HIGH (ref <130)
Total CHOL/HDL Ratio: 3.2 (calc) (ref <5.0)
Triglycerides: 112 mg/dL (ref <150)

## 2024-03-15 LAB — T-HELPER CELLS (CD4) COUNT (NOT AT ARMC)
Absolute CD4: 232 {cells}/uL — ABNORMAL LOW (ref 490–1740)
CD4 T Helper %: 11 % — ABNORMAL LOW (ref 30–61)
Total lymphocyte count: 2109 {cells}/uL (ref 850–3900)

## 2024-03-15 LAB — T PALLIDUM AB: T Pallidum Abs: NEGATIVE

## 2024-03-15 LAB — HIV-1 RNA QUANT-NO REFLEX-BLD
HIV 1 RNA Quant: NOT DETECTED {copies}/mL
HIV-1 RNA Quant, Log: NOT DETECTED {Log_copies}/mL

## 2024-03-15 NOTE — Therapy (Signed)
 OUTPATIENT PHYSICAL THERAPY THORACOLUMBAR TREATMEMT   Patient Name: Kelsey Rodriguez MRN: [id number] DOB:Oct 12, 1991, 33 y.o., female Today's Date: 03/16/2024  END OF SESSION:  PT End of Session - 03/16/24 1604     Visit Number 2    Number of Visits 16    Date for PT Re-Evaluation 05/04/24    Authorization Type [employer] MCR/MCD    PT Start Time 1604    PT Stop Time 1645    PT Time Calculation (min) 41 min    Activity Tolerance No increased pain;Patient limited by pain    Behavior During Therapy [facility] for tasks assessed/performed              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   Bullae 05/30/2012   Cerebral edema (HCC) 10/28/2019   Cervical dysplasia, mild 12/06/2020   Chronic back pain    Chronic kidney disease    Chronic leg pain    bilateral knees, ankles   Chronic pelvic pain in female 01/04/2019   Complex regional pain syndrome 02/03/2017   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HCC)    Depression    Eczema 02/28/2022   Encephalitis, myelitis, 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   Fatigue    GERD (gastroesophageal reflux disease)    Headache    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   HIV (human immunodeficiency virus infection) (HCC) 02/2012   Hypertension    Laceration of ankle, right 11/18/2012   Lower abdominal pain 06/21/2018   Lumbar radiculopathy    Meningitis 02/18/2018   Pain of upper abdomen    Pelvic pain    PID (acute  pelvic inflammatory disease) 02/26/2018   Pneumonia    Reflux esophagitis 03/11/2012   Rotator cuff strain 01/26/2020   S/P craniotomy 05/16/2020   Seizure (HCC)    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   Suicide ideation    Syphilis 02/26/2018   Tendinopathy of left shoulder 01/18/2019   TOA (tubo-ovarian abscess) 04/15/2021   Tuberculosis    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   Tubo-ovarian abscess 04/25/2021   Vertigo    Wears glasses    Past Surgical History:  Procedure Laterality Date   APPENDECTOMY  ~ 2000   APPLICATION OF CRANIAL NAVIGATION N/A 05/16/2020   Procedure: APPLICATION OF CRANIAL NAVIGATION;  Surgeon: Tressie Stalker, MD;  Location: [facility] OR;  Service: Neurosurgery;  Laterality: N/A;   CRANIOTOMY Right 05/16/2020   Procedure: Craniotomy for Resection of Lesion;  Surgeon: Tressie Stalker, MD;  Location: [facility] OR;  Service: Neurosurgery;  Laterality: Right;  right   CYSTOSCOPY WITH RETROGRADE PYELOGRAM, URETEROSCOPY AND STENT PLACEMENT Left 01/05/2023   Procedure: CYSTOSCOPY WITH BILATERAL RETROGRADE PYELOGRAM, LEFT DIAGNOSTIC URETEROSCOPY AND BLADDER BIOPSY;  Surgeon: Jannifer Hick, MD;  Location: [facility] ORS;  Service: Urology;  Laterality: Left;  30 MINUTES NEEDED FOR CASE  ANESTHESIA INTUBATED, PARALYZED   DILATION AND CURETTAGE OF  UTERUS  12/15/2006   ESOPHAGOGASTRODUODENOSCOPY  03/11/2012   Procedure: ESOPHAGOGASTRODUODENOSCOPY (EGD);  Surgeon: Hart Carwin, MD;  Location[facility];  Service: Endoscopy;  Laterality: N/A;   ESOPHAGOGASTRODUODENOSCOPY N/A 03/07/2014   Procedure: ESOPHAGOGASTRODUODENOSCOPY (EGD);  Surgeon: Iva Boop, MD;  Location: [facility] ENDOSCOPY;  Service: Endoscopy;  Laterality: N/A;   LAPAROSCOPIC OOPHERECTOMY  05/2021   [facility] - [facility] and right salpingectomy   LUNG BIOPSY  02/13/2012   Salpingectomy Left 05/2021   TOTAL HIP ARTHROPLASTY Left 12/14/2015    Procedure: LEFT TOTAL HIP ARTHROPLASTY ANTERIOR APPROACH;  Surgeon: Kathryne Hitch, MD;  Location: [facility] ORS;  Service: Orthopedics;  Laterality: Left;   TOTAL HIP ARTHROPLASTY Right 04/04/2016   Procedure: RIGHT TOTAL HIP ARTHROPLASTY ANTERIOR APPROACH;  Surgeon: Kathryne Hitch, MD;  Location: [facility] ORS;  Service: Orthopedics;  Laterality: Right;   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 05/09/2020   Hypertension    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HCC)    Seizure (HCC)    Toxoplasmosis 11/07/2019   AIDS (acquired immune deficiency syndrome) (HCC) 11/07/2019   Chronic pelvic pain in female 01/04/2019   Tuberculosis    Complex regional pain syndrome 02/03/2017   Headache 10/28/2016   Primary adrenal insufficiency (HCC) 01/03/2015   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    PCP: [facility], [facility]   REFERRING PROVIDER: Kirtland Bouchard, PA-C   REFERRING DIAG:  [phone] (ICD-10-CM) - Pain in right hip  M54.41,G89.29 (ICD-10-CM) - Chronic right-sided low back pain with right-sided sciatica    Rationale for Evaluation and Treatment: Rehabilitation  THERAPY DIAG:  Pain in right hip  Chronic right-sided low back pain with right-sided sciatica  Difficulty in walking, not elsewhere classified  ONSET DATE: 3months December 2024  SUBJECTIVE:                                                                                                                                                                                           SUBJECTIVE STATEMENT:  I was running late and I missed the exit.  I am about a 7/10 today. I  always have pain in my right hip when I move it   EVAL- I have been having back pain about 2-3 months with a pinching pain in my low back depending on how I  bend forward. I cannot stand for 10-15 min.  I always have pain when I bend over especially returning to upright  My second issue is my both my hips are THA because of Avascular necrosis and I am good with my left hip but my Right hip gives me problems. I recently on Monday March 23rd received  an injection.  I cannot work due to the pain. 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 Headaches, HIV see medical hx  PAIN:  Are you having pain? Yes: NPRS scale: at rest hip with medicine 3-4/10 but at worst  9-10/10, back at rest 0/10 but at worst 8/10 Pain location: Right back and right hip Pain description: sharp pinching pain in back and right hip Aggravating factors: standing for more than 5 min, bending over, cannot stand after 15 min, cannot sleep on Right side with increasing numbness in hip, steps and dressing getting in and out of car Relieving factors: medicine and resting  PRECAUTIONS: Other: HIV  RED FLAGS: None   WEIGHT BEARING RESTRICTIONS: Yes WBAT bil  FALLS:  Has patient fallen in last 6 months? 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  PLOF: Independent  PATIENT GOALS:  be able to walk and stand and sleep more comfortably to complete school and work  NEXT MD VISIT: TBD  OBJECTIVE:  Note: Objective measures were  completed at Evaluation unless otherwise noted.  DIAGNOSTIC FINDINGS:  Imaging: XR Lumbar Spine 2-3 Views Result Date: 02/24/2024 Lumbar spine 2 views: No acute fractures displays well-maintained.  No arthropathic changes.  Normal lordotic curvature.  No spondylolisthesis.  No acute findings.  Status post bilateral hips acetabular components are seen partially on the AP view.  There is no evidence of complicating features of the acetabular components of bilateral hips.  PATIENT SURVEYS:  Modified Oswestry 58%   COGNITION: Overall cognitive status: Within functional limits for tasks assessed     SENSATION: WFL  MUSCLE LENGTH: Hamstrings: Right 56 deg; Left 70 deg Thomas test: Right 20 degrees from horizontal  deg; Left 0 from horizontal deg  POSTURE: rounded shoulders, forward head, and weight shift left Left knee more valgus than Right PALPATION: TTP over posterior/lateral iliac crest, marked TTP over SI Right joint and elevated compared to right  LUMBAR ROM:   AROM eval  Flexion Fingertip to mid shin * especially returning to stand  Extension 30% *  Right lateral flexion 30%*  Left lateral flexion 90%  Right rotation 50%  Left rotation 90%   (Blank rows = not tested)  LOWER EXTREMITY ROM:     Active  Right eval Left eval  Hip flexion 90 with pinching pain 105  Hip extension    Hip abduction    Hip adduction    Hip internal rotation    Hip external rotation    Knee flexion 115 132  Knee extension    Ankle dorsiflexion    Ankle plantarflexion    Ankle inversion  Ankle eversion     Key: WFL = within functional limits not formally assessed, * = concordant pain, s = stiffness/stretching sensation, NT = not tested)  LOWER EXTREMITY MMT:    MMT Right eval Left eval  Hip flexion 3- to 90 degrees only 4+  Hip extension 4- 4+  Hip abduction 3+ 4+  Hip adduction    Hip internal rotation    Hip external rotation    Knee flexion    Knee extension    Ankle  dorsiflexion    Ankle plantarflexion 18/25 25/25  Ankle inversion    Ankle eversion    (Blank rows = not tested, score listed is out of 5 possible points.  N = WNL, D = diminished, C = clear for gross weakness with myotome testing, * = concordant pain with testing)  LUMBAR SPECIAL TESTS:  Straight leg raise test: Negative, Slump test: Negative, and SI Compression/distraction test: Positive Right SI  FUNCTIONAL TESTS:  5 times sit to stand: 14.49 sec 2 minute walk test: 397.2 ft  Minimum norm 579 ft 6 minute walk test: TBD  GAIT: Distance walked: 150 Assistive device utilized: None Level of assistance: Complete Independence Comments: antalgic R gait, wt shift to left [facility] Adult PT Treatment:                                                DATE: 03-16-24 Aquatic therapy at [facility]- [address] - therapeutic pool temp approximately 92 degrees.Pt enters building independently. Treatment took place in water 3.8 to  4 ft 8 in. deep depending upon activity.  Pt entered and exited the pool via stair and handrails independently. Pt pain level 7/10 at the initiation of treatment   Kasi was educated on  beneficial therapeutic effects of water while ambulating to acclimate to water walking forward, backward and side stepping.  Pt educated on neutral posture and hip hinging in seated position with water at chest level x 10 with stretch to low back and then x 10 with back at pool wall at external cue, VC for neck tucked to prevent hyperextension.   Aquatic Exercise: On submerged bench and on edge of pool holding with one UE   Walking side stepping and high stepping across pool x 4 Heel/ toe raises BIL   Hip ext/flex with knee straight  Hip abduction/adduction  Hamstring curl Hip circles CW/CCW  pt fatiguing quickly on Right Figure 4 piriformis stretch using wall x30" BIL Squats x 20 Gastroc stretch of foot on pool wall 30 " x 2 on R and L CARS on R and L LE with UE support Runners  stretch bottom step x30" BIL Hamstring stretch bottom step x30" BIL Aqua stretch for Right hip and gluteals  TREATMENT DATE: eval and issue HEP  PATIENT EDUCATION:  Education details: POC Explanation of findings, issue HEP Person educated: Patient Education method: Explanation, Demonstration, Tactile cues, Verbal cues, and Handouts Education comprehension: verbalized understanding, returned demonstration, verbal cues required, tactile cues required, and needs further education  HOME EXERCISE PROGRAM: Access Code: [email or web address] URL: [email or web address] Date: 03/09/2024 Prepared by: Garen Lah  Exercises - Hip flexor stretch at edge of bed with SLR resisted  - 2 x daily - 7 x weekly - 1 sets - 3 reps - 30sec hold - Supine Lower Trunk Rotation  - 2 x daily - 7 x weekly - 1 sets - 5 reps - 20 sec hold  ASSESSMENT:  CLINICAL IMPRESSION: Patient presents 15 min late to first aquatic PT session reporting 7/10 pain in Right hip. She is somewhat apprehensive upon entering water, session today stayed at wall for support and by end of session she was feeling more comfortable in the water although using yellow DB for balance and security.Session today focused on introduction to aquatics, general strengthening in the aquatic environment for use of buoyancy to offload joints and the viscosity of water as resistance during therapeutic exercise. Patient was able to tolerate all prescribed exercises in the aquatic environment with no adverse effects. Patient will  benefit from skilled PT services on land and aquatic based and should be progressed as able to improve functional independence. Reduced to 6/10 after aquatics.   EVAL-Patient is a 33 y.o. female who was seen today for physical therapy evaluation and treatment for Right back and right hip pain with bil  THA due to vascular necrosis and in past multiple medical issues.  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   OBJECTIVE IMPAIRMENTS: Abnormal gait, decreased activity tolerance, decreased mobility, decreased ROM, decreased strength, impaired flexibility, obesity, and pain.   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 Headaches, HIV see medical hx are also affecting patient's functional outcome.   REHAB POTENTIAL: Good  CLINICAL DECISION MAKING: Evolving/moderate complexity  EVALUATION COMPLEXITY: Moderate   GOALS: Goals reviewed with patient? Yes  SHORT TERM GOALS: Target date: 04-07-24  Pt will be independent with initial HEP Baseline:no knowledge Goal status: INITIAL  2.  Pt will be able to begin walking program to build endurance and tolerance for standing/walking Baseline: unable to stand greater than 5-15 min Goal status: INITIAL  3.   Pt will demonstrate Right lateral and R extension lumbar AROM in order to demonstrate improved tolerance to functional movement patterns.  Baseline: See AROM chart Goal status: INITIAL  4.  Pt will be able to perform 5x STS in 13 seconds or less to show improved LE strength Baseline: eval 14.49 sec Goal status: INITIAL    LONG TERM GOALS: Target date: 05-04-24  Pt will be independent with advanced  HEP Baseline: no knowledge Goal status: INITIAL  2.  Pt will be able to dress with 50% greater ease than on eval Baseline:  Pt must dress sitting down slowly and carefully at eval Goal status: INITIAL  3.  Pt will be able to sleep with reduced pain and on Right side without awaking due to pain Baseline: sleeps only every 2 hours and needs to reposition Goal status: INITIAL  4.  Pt will improve ODI to at least 45 % to show improved functional mobility Baseline: 58% eval Goal status: INITIAL  5.  Pt will be able to stand for 30 min to complete cooking/school duties and show improvement in activity tolerance Baseline: Pt cannot stand for more than 5 min without pain Goal status: INITIAL    PLAN:  PT FREQUENCY: 1-2x/week  PT DURATION: 8 weeks  PLANNED INTERVENTIONS: 97164- PT Re-evaluation, 97110-Therapeutic exercises, 97530- Therapeutic activity, 97112- Neuromuscular re-education, 97535- Self Care, 16109- Manual therapy, [id number]- Gait training, [phone]- Electrical stimulation (manual), Patient/Family education, Stair training, Taping, Dry Needling, Joint mobilization, Spinal mobilization, Cryotherapy, and Moist heat.  PLAN FOR NEXT SESSION:  Check R SI joint,  hip flexor stretching/ manual/ TPDN  aquatics  Garen Lah, PT, [facility] Certified Exercise Expert for the Aging Adult  03/16/24 4:58 PM Phone: [phone] Fax: [phone]   Date of referral: 02-25-24 Referring provider: Kirtland Bouchard, PA-C  Referring diagnosis?  M25.551 (ICD-10-CM) - Pain in right hip  M54.41,G89.29 (ICD-10-CM) - Chronic right-sided low back pain with right-sided sciatica   Treatment diagnosis? (if different than referring diagnosis) muscle weakness, SI joint with hip and back pain  What was this (referring dx) caused by? Other: bil THA surgeries and ongoing back pain  Nature of Condition: Chronic (continuous duration > 3 months)   Laterality: Rt  Current Functional Measure Score: Other ODI   58%  Objective measurements identify impairments when they are compared to normal values, the uninvolved extremity, and prior level of function.  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  Cannot stand more than 5-15 min without severe pain  How did symptoms start: Avascular necrosis bil /surgery bil hips and chronic back pain  Average pain intensity:  Last 24 hours: 8-10/10 without medication or injections  Past week: 8-10/10   How often does the pt experience symptoms? Constantly  How much have the symptoms interfered with usual daily activities? Quite a bit  How has condition changed since care began at this facility? NA - initial visit  In general, how is the patients overall health? Fair multiple medical issues   BACK PAIN (STarT Back Screening Tool) Has pain spread down the leg(s) at some time in the last 2 weeks? yes Has there been pain in the shoulder or neck at some time in the last 2 weeks? yes Has the pt only walked short distances because of back pain? Yes less than 5 min -15 min walking Has patient dressed more slowly because of back pain in the past 2 weeks? yes Does patient think it's not safe for a person with this condition to be physically active? Sometimes worried about falling but has not fallen in last 6 months Does patient have worrying thoughts a lot of the time? yes Does patient feel back pain is terrible and will never get any better? yes Has patient stopped enjoying things they usually enjoy? yes Overall, how bothersome has back pain been in the last 2 weeks?  Very Much

## 2024-03-16 ENCOUNTER — Ambulatory Visit: Attending: Physician Assistant | Admitting: Physical Therapy

## 2024-03-16 ENCOUNTER — Encounter: Payer: Self-pay | Admitting: Physical Therapy

## 2024-03-16 DIAGNOSIS — G8929 Other chronic pain: Secondary | ICD-10-CM | POA: Insufficient documentation

## 2024-03-16 DIAGNOSIS — M5441 Lumbago with sciatica, right side: Secondary | ICD-10-CM | POA: Diagnosis present

## 2024-03-16 DIAGNOSIS — R262 Difficulty in walking, not elsewhere classified: Secondary | ICD-10-CM | POA: Diagnosis present

## 2024-03-16 DIAGNOSIS — M25551 Pain in right hip: Secondary | ICD-10-CM | POA: Diagnosis present

## 2024-03-18 ENCOUNTER — Ambulatory Visit: Admitting: Physical Therapy

## 2024-03-18 ENCOUNTER — Encounter: Payer: Self-pay | Admitting: Physical Therapy

## 2024-03-18 DIAGNOSIS — M25551 Pain in right hip: Secondary | ICD-10-CM

## 2024-03-18 DIAGNOSIS — G8929 Other chronic pain: Secondary | ICD-10-CM

## 2024-03-18 NOTE — Therapy (Signed)
 OUTPATIENT PHYSICAL THERAPY THORACOLUMBAR TREATMEMT   Patient Name: Kelsey Rodriguez MRN: [id number] DOB:16-Apr-1991, 33 y.o., female Today's Date: 03/18/2024  END OF SESSION:  PT End of Session - 03/18/24 0705     Visit Number 3    Number of Visits 16    Date for PT Re-Evaluation 05/04/24    Authorization Type [employer] MCR/MCD    PT Start Time 0701    PT Stop Time 0741    PT Time Calculation (min) 40 min    Activity Tolerance No increased pain;Patient limited by pain    Behavior During Therapy [facility] for tasks assessed/performed              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   Bullae 05/30/2012   Cerebral edema (HCC) 10/28/2019   Cervical dysplasia, mild 12/06/2020   Chronic back pain    Chronic kidney disease    Chronic leg pain    bilateral knees, ankles   Chronic pelvic pain in female 01/04/2019   Complex regional pain syndrome 02/03/2017   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HCC)    Depression    Eczema 02/28/2022   Encephalitis, myelitis, 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   Fatigue    GERD (gastroesophageal reflux disease)    Headache    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   HIV (human immunodeficiency virus infection) (HCC) 02/2012   Hypertension    Laceration of ankle, right 11/18/2012   Lower abdominal pain 06/21/2018   Lumbar radiculopathy    Meningitis 02/18/2018   Pain of upper abdomen    Pelvic pain    PID (acute  pelvic inflammatory disease) 02/26/2018   Pneumonia    Reflux esophagitis 03/11/2012   Rotator cuff strain 01/26/2020   S/P craniotomy 05/16/2020   Seizure (HCC)    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   Suicide ideation    Syphilis 02/26/2018   Tendinopathy of left shoulder 01/18/2019   TOA (tubo-ovarian abscess) 04/15/2021   Tuberculosis    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   Tubo-ovarian abscess 04/25/2021   Vertigo    Wears glasses    Past Surgical History:  Procedure Laterality Date   APPENDECTOMY  ~ 2000   APPLICATION OF CRANIAL NAVIGATION N/A 05/16/2020   Procedure: APPLICATION OF CRANIAL NAVIGATION;  Surgeon: Tressie Stalker, MD;  Location: [facility] OR;  Service: Neurosurgery;  Laterality: N/A;   CRANIOTOMY Right 05/16/2020   Procedure: Craniotomy for Resection of Lesion;  Surgeon: Tressie Stalker, MD;  Location: [facility] OR;  Service: Neurosurgery;  Laterality: Right;  right   CYSTOSCOPY WITH RETROGRADE PYELOGRAM, URETEROSCOPY AND STENT PLACEMENT Left 01/05/2023   Procedure: CYSTOSCOPY WITH BILATERAL RETROGRADE PYELOGRAM, LEFT DIAGNOSTIC URETEROSCOPY AND BLADDER BIOPSY;  Surgeon: Jannifer Hick, MD;  Location: [facility] ORS;  Service: Urology;  Laterality: Left;  30 MINUTES NEEDED FOR CASE  ANESTHESIA INTUBATED, PARALYZED   DILATION AND CURETTAGE OF  UTERUS  12/15/2006   ESOPHAGOGASTRODUODENOSCOPY  03/11/2012   Procedure: ESOPHAGOGASTRODUODENOSCOPY (EGD);  Surgeon: Hart Carwin, MD;  Location: [facility] ENDOSCOPY;  Service: Endoscopy;  Laterality: N/A;   ESOPHAGOGASTRODUODENOSCOPY N/A 03/07/2014   Procedure: ESOPHAGOGASTRODUODENOSCOPY (EGD);  Surgeon: Iva Boop, MD;  Location: [facility] ENDOSCOPY;  Service: Endoscopy;  Laterality: N/A;   LAPAROSCOPIC OOPHERECTOMY  05/2021   [facility] - [facility] and right salpingectomy   LUNG BIOPSY  02/13/2012   Salpingectomy Left 05/2021   TOTAL HIP ARTHROPLASTY Left 12/14/2015    Procedure: LEFT TOTAL HIP ARTHROPLASTY ANTERIOR APPROACH;  Surgeon: Kathryne Hitch, MD;  Location: [facility] ORS;  Service: Orthopedics;  Laterality: Left;   TOTAL HIP ARTHROPLASTY Right 04/04/2016   Procedure: RIGHT TOTAL HIP ARTHROPLASTY ANTERIOR APPROACH;  Surgeon: Kathryne Hitch, MD;  Location: [facility] ORS;  Service: Orthopedics;  Laterality: Right;   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 05/09/2020   Hypertension    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HCC)    Seizure (HCC)    Toxoplasmosis 11/07/2019   AIDS (acquired immune deficiency syndrome) (HCC) 11/07/2019   Chronic pelvic pain in female 01/04/2019   Tuberculosis    Complex regional pain syndrome 02/03/2017   Headache 10/28/2016   Primary adrenal insufficiency (HCC) 01/03/2015   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    PCP: Pa, [facility]   REFERRING PROVIDER: Kirtland Bouchard, PA-C   REFERRING DIAG:  [phone] (ICD-10-CM) - Pain in right hip  M54.41,G89.29 (ICD-10-CM) - Chronic right-sided low back pain with right-sided sciatica    Rationale for Evaluation and Treatment: Rehabilitation  THERAPY DIAG:  Pain in right hip  Chronic right-sided low back pain with right-sided sciatica  ONSET DATE: 3months December 2024  SUBJECTIVE:                                                                                                                                                                                           SUBJECTIVE STATEMENT:  03/18/2024: Pt reports some muscle soreness following aquatic therapy.  She has lots of difficulty bending to pick something up  after standing for a long period.   EVAL- I have been having back pain about 2-3 months with a pinching pain in my low back depending on how I bend forward. I cannot stand for 10-15 min.  I always have pain when I bend over especially returning to upright  My second issue is my both my hips are THA because of Avascular necrosis and I am good with my left hip but my Right hip gives me problems. I recently on Monday March 23rd received  an injection.  I cannot work due to the pain. 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 Headaches, HIV see medical hx  PAIN:  Are you having pain? Yes: NPRS scale: at rest hip with medicine 3-4/10 but at worst  9-10/10, back at rest 0/10 but at worst 8/10 Pain location: Right back and right hip Pain description: sharp pinching pain in back and right hip Aggravating factors: standing for more than 5 min, bending over, cannot stand after 15 min, cannot sleep on Right side with increasing numbness in hip, steps and dressing getting in and out of car Relieving factors: medicine and resting  PRECAUTIONS: Other: HIV  RED FLAGS: None   WEIGHT BEARING RESTRICTIONS: Yes WBAT bil  FALLS:  Has patient fallen in last 6 months? 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  PLOF: Independent  PATIENT GOALS:  be able to walk and stand and sleep more comfortably to complete school and work  NEXT MD VISIT: TBD  OBJECTIVE:  Note: Objective measures were completed at  Evaluation unless otherwise noted.  DIAGNOSTIC FINDINGS:  Imaging: XR Lumbar Spine 2-3 Views Result Date: 02/24/2024 Lumbar spine 2 views: No acute fractures displays well-maintained.  No arthropathic changes.  Normal lordotic curvature.  No spondylolisthesis.  No acute findings.  Status post bilateral hips acetabular components are seen partially on the AP view.  There is no evidence of complicating features of the acetabular components of bilateral hips.  PATIENT SURVEYS:  Modified Oswestry 58%   COGNITION: Overall cognitive status: Within functional limits for tasks assessed     SENSATION: WFL  MUSCLE LENGTH: Hamstrings: Right 56 deg; Left 70 deg Thomas test: Right 20 degrees from horizontal  deg; Left 0 from horizontal deg  POSTURE: rounded shoulders, forward head, and weight shift left Left knee more valgus than Right PALPATION: TTP over posterior/lateral iliac crest, marked TTP over SI Right joint and elevated compared to right  LUMBAR ROM:   AROM eval  Flexion Fingertip to mid shin * especially returning to stand  Extension 30% *  Right lateral flexion 30%*  Left lateral flexion 90%  Right rotation 50%  Left rotation 90%   (Blank rows = not tested)  LOWER EXTREMITY ROM:     Active  Right eval Left eval  Hip flexion 90 with pinching pain 105  Hip extension    Hip abduction    Hip adduction    Hip internal rotation    Hip external rotation    Knee flexion 115 132  Knee extension    Ankle dorsiflexion    Ankle plantarflexion    Ankle inversion    Ankle eversion     Key: WFL =  within functional limits not formally assessed, * = concordant pain, s = stiffness/stretching sensation, NT = not tested)  LOWER EXTREMITY MMT:    MMT Right eval Left eval  Hip flexion 3- to 90 degrees only 4+  Hip extension 4- 4+  Hip abduction 3+ 4+  Hip adduction    Hip internal rotation    Hip external rotation    Knee flexion    Knee extension    Ankle dorsiflexion     Ankle plantarflexion 18/25 25/25  Ankle inversion    Ankle eversion    (Blank rows = not tested, score listed is out of 5 possible points.  N = WNL, D = diminished, C = clear for gross weakness with myotome testing, * = concordant pain with testing)  LUMBAR SPECIAL TESTS:  Straight leg raise test: Negative, Slump test: Negative, and SI Compression/distraction test: Positive Right SI  FUNCTIONAL TESTS:  5 times sit to stand: 14.49 sec 2 minute walk test: 397.2 ft  Minimum norm 579 ft 6 minute walk test: TBD  GAIT: Distance walked: 150 Assistive device utilized: None Level of assistance: Complete Independence Comments: antalgic R gait, wt shift to left  [facility] Adult PT Treatment  03/18/2024:  Therapeutic Exercise:  nu-step L5 2m while taking subjective and planning session with patient LTR - 20x Modified thomas stretch 1' x2 ea Supine isometric clam with cues for core contraction - 2x10 ea Seated roll out on P ball - C/L/R - 10x ea  Therapeutic Activity  Bridge on ball - 10x Standing hip abd - 2x10 ea Sit to stand from table - x10 Education: DOMS and progressive overload principles    [facility] Adult PT Treatment:                                                DATE: 03-16-24 Aquatic therapy at [facility] [address] - therapeutic pool temp approximately 92 degrees.Pt enters building independently. Treatment took place in water 3.8 to  4 ft 8 in. deep depending upon activity.  Pt entered and exited the pool via stair and handrails independently. Pt pain level 7/10 at the initiation of treatment   Kelsey Rodriguez was educated on  beneficial therapeutic effects of water while ambulating to acclimate to water walking forward, backward and side stepping.  Pt educated on neutral posture and hip hinging in seated position with water at chest level x 10 with stretch to low back and then x 10 with back at pool wall at external cue, VC for neck tucked to prevent hyperextension.   Aquatic  Exercise: On submerged bench and on edge of pool holding with one UE   Walking side stepping and high stepping across pool x 4 Heel/ toe raises BIL   Hip ext/flex with knee straight  Hip abduction/adduction  Hamstring curl Hip circles CW/CCW  pt fatiguing quickly on Right Figure 4 piriformis stretch using wall x30" BIL Squats x 20 Gastroc stretch of foot on pool wall 30 " x 2 on R and L CARS on R and L LE with UE support Runners stretch bottom step x30" BIL Hamstring stretch bottom step x30" BIL Aqua stretch for Right hip and gluteals  TREATMENT DATE: eval and issue HEP  PATIENT EDUCATION:  Education details: POC Explanation of findings, issue HEP Person educated: Patient Education method: Explanation, Demonstration, Tactile cues, Verbal cues, and Handouts Education comprehension: verbalized understanding, returned demonstration, verbal cues required, tactile cues required, and needs further education  HOME EXERCISE PROGRAM: Access Code: [email or web address] URL: [email or web address] Date: 03/09/2024 Prepared by: Garen Lah  Exercises - Hip flexor stretch at edge of bed with SLR resisted  - 2 x daily - 7 x weekly - 1 sets - 3 reps - 30sec hold - Supine Lower Trunk Rotation  - 2 x daily - 7 x weekly - 1 sets - 5 reps - 20 sec hold  ASSESSMENT:  CLINICAL IMPRESSION:  03/18/2024: Kelsey Rodriguez tolerated session well with no adverse reaction.  Pt with significant DOMS following aquatic therapy so exercise intensity kept relatively low.  Pt educated on DOMS and progressive resistance training as well as need for consistent strength and mobility work to see benefits; pt receptive.  Pt reports high levels of fatigue with exercises completed today.  She does have a minor increase in R hip pain during thomas stretch hip flexion so this was modified with foot  elevated vs SKTC.   EVAL-Patient is a 33 y.o. female who was seen today for physical therapy evaluation and treatment for Right back and right hip pain with bil THA due to vascular necrosis and in past multiple medical issues.  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   OBJECTIVE IMPAIRMENTS: Abnormal gait, decreased activity tolerance, decreased mobility, decreased ROM, decreased strength, impaired flexibility, obesity, and pain.   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 Headaches, HIV see medical hx are also affecting patient's functional outcome.   REHAB POTENTIAL: Good  CLINICAL DECISION MAKING: Evolving/moderate complexity  EVALUATION COMPLEXITY: Moderate   GOALS: Goals reviewed with patient? Yes  SHORT TERM GOALS: Target date: 04-07-24  Pt will be independent with initial HEP Baseline:no knowledge Goal status: INITIAL  2.  Pt will be able to begin walking program to build endurance and tolerance for standing/walking Baseline: unable to stand greater than 5-15 min Goal status: INITIAL  3.   Pt will demonstrate Right lateral and R extension lumbar AROM in order to demonstrate improved tolerance to functional movement patterns.  Baseline: See AROM chart Goal status: INITIAL  4.  Pt will be able to perform 5x STS in 13 seconds or less to  show improved LE strength Baseline: eval 14.49 sec Goal status: INITIAL    LONG TERM GOALS: Target date: 05-04-24  Pt will be independent with advanced HEP Baseline: no knowledge Goal status: INITIAL  2.  Pt will be able to dress with 50% greater ease than on eval Baseline: Pt must dress sitting down slowly and carefully at eval Goal status: INITIAL  3.  Pt will be able to sleep with reduced pain and on Right side without awaking due to pain Baseline: sleeps only every 2 hours  and needs to reposition Goal status: INITIAL  4.  Pt will improve ODI to at least 45 % to show improved functional mobility Baseline: 58% eval Goal status: INITIAL  5.  Pt will be able to stand for 30 min to complete cooking/school duties and show improvement in activity tolerance Baseline: Pt cannot stand for more than 5 min without pain Goal status: INITIAL    PLAN:  PT FREQUENCY: 1-2x/week  PT DURATION: 8 weeks  PLANNED INTERVENTIONS: 97164- PT Re-evaluation, 97110-Therapeutic exercises, 97530- Therapeutic activity, 97112- Neuromuscular re-education, 97535- Self Care, 46962- Manual therapy, [id number]- Gait training, [phone]- Electrical stimulation (manual), Patient/Family education, Stair training, Taping, Dry Needling, Joint mobilization, Spinal mobilization, Cryotherapy, and Moist heat.  PLAN FOR NEXT SESSION:  Check R SI joint,  hip flexor stretching/ manual/ TPDN  aquatics  Garen Lah, PT, [facility] Certified Exercise Expert for the Aging Adult  03/18/24 8:29 AM Phone: [phone] Fax: [phone]   Date of referral: 02-25-24 Referring provider: Kirtland Bouchard, PA-C  Referring diagnosis?  M25.551 (ICD-10-CM) - Pain in right hip  M54.41,G89.29 (ICD-10-CM) - Chronic right-sided low back pain with right-sided sciatica   Treatment diagnosis? (if different than referring diagnosis) muscle weakness, SI joint with hip and back pain  What was this (referring dx) caused by? Other: bil THA  surgeries and ongoing back pain  Nature of Condition: Chronic (continuous duration > 3 months)   Laterality: Rt  Current Functional Measure Score: Other ODI  58%  Objective measurements identify impairments when they are compared to normal values, the uninvolved extremity, and prior level of function.  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  Cannot stand more than 5-15 min without severe pain  How did symptoms start: Avascular necrosis bil /surgery bil hips and chronic back pain  Average pain intensity:  Last 24 hours: 8-10/10 without medication or injections  Past week: 8-10/10   How often does the pt experience symptoms? Constantly  How much have the symptoms interfered with usual daily activities? Quite a bit  How has condition changed since care began at this facility? NA - initial visit  In general, how is the patients overall health? Fair multiple medical issues   BACK PAIN (STarT Back Screening Tool) Has pain spread down the leg(s) at some time in the last 2 weeks? yes Has there been pain in the shoulder or neck at some time in the last 2 weeks? yes Has the pt only walked short distances because of back pain? Yes less than 5 min -15 min walking Has patient dressed more slowly because of back pain in the past 2 weeks? yes Does patient think it's not safe for a person with this condition to be physically active? Sometimes worried about falling but has not fallen in last 6 months Does patient have worrying thoughts a lot of the time? yes Does patient feel back pain is terrible and will never get any better? yes Has patient stopped enjoying things they usually enjoy? yes Overall, how bothersome has back pain been in the last 2 weeks?                    Very Much

## 2024-03-23 ENCOUNTER — Other Ambulatory Visit (HOSPITAL_COMMUNITY): Payer: Self-pay

## 2024-03-23 ENCOUNTER — Other Ambulatory Visit: Payer: Self-pay

## 2024-03-23 NOTE — Progress Notes (Signed)
 Specialty Pharmacy Refill Coordination Note  Kelsey Rodriguez is a 33 y.o. female assessed today regarding refills of clinic administered specialty medication(s) Cabotegravir & Rilpivirine Renaldo Harrison)   Clinic requested Courier to Provider Office   Delivery date: 03/31/24   Verified address: [address] [place] [address]   Medication will be filled on 03/30/24.

## 2024-03-25 ENCOUNTER — Ambulatory Visit: Admitting: Physical Therapy

## 2024-03-29 ENCOUNTER — Ambulatory Visit
Admission: RE | Admit: 2024-03-29 | Discharge: 2024-03-29 | Disposition: A | Discharge status: Home / Self Care | Source: Ambulatory Visit | Attending: Internal Medicine | Admitting: Internal Medicine

## 2024-03-29 DIAGNOSIS — R519 Headache, unspecified: Secondary | ICD-10-CM

## 2024-03-29 DIAGNOSIS — B589 Toxoplasmosis, unspecified: Secondary | ICD-10-CM

## 2024-03-29 MED ORDER — GADOPICLENOL 0.5 MMOL/ML IV SOLN
9.0000 mL | Freq: Once | INTRAVENOUS | Status: AC | PRN
  Administered 2024-03-29: 9 mL via INTRAVENOUS

## 2024-03-30 ENCOUNTER — Ambulatory Visit: Admitting: Physical Therapy

## 2024-03-30 ENCOUNTER — Encounter: Payer: Self-pay | Admitting: Physical Therapy

## 2024-03-30 DIAGNOSIS — M25551 Pain in right hip: Secondary | ICD-10-CM | POA: Diagnosis not present

## 2024-03-30 DIAGNOSIS — R262 Difficulty in walking, not elsewhere classified: Secondary | ICD-10-CM

## 2024-03-30 DIAGNOSIS — G8929 Other chronic pain: Secondary | ICD-10-CM

## 2024-03-30 NOTE — Therapy (Signed)
 OUTPATIENT PHYSICAL THERAPY THORACOLUMBAR TREATMEMT   Patient Name: Kelsey Rodriguez MRN: [id number] DOB:Jul 06, 1991, 33 y.o., female Today's Date: 03/30/2024  END OF SESSION:  PT End of Session - 03/30/24 1503     Visit Number 4    Number of Visits 16    Date for PT Re-Evaluation 05/04/24    Authorization Type U[employer] MCR/MCD    PT Start Time 1502    PT Stop Time 1545    PT Time Calculation (min) 43 min    Activity Tolerance No increased pain;Patient limited by pain    Behavior During Therapy [facility] for tasks assessed/performed               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   Bullae 05/30/2012   Cerebral edema (HCC) 10/28/2019   Cervical dysplasia, mild 12/06/2020   Chronic back pain    Chronic kidney disease    Chronic leg pain    bilateral knees, ankles   Chronic pelvic pain in female 01/04/2019   Complex regional pain syndrome 02/03/2017   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HCC)    Depression    Eczema 02/28/2022   Encephalitis, myelitis, 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   Fatigue    GERD (gastroesophageal reflux disease)    Headache    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   HIV (human immunodeficiency virus infection) (HCC) 02/2012   Hypertension    Laceration of ankle, right 11/18/2012   Lower abdominal pain 06/21/2018   Lumbar radiculopathy    Meningitis 02/18/2018   Pain of upper abdomen    Pelvic pain    PID (acute  pelvic inflammatory disease) 02/26/2018   Pneumonia    Reflux esophagitis 03/11/2012   Rotator cuff strain 01/26/2020   S/P craniotomy 05/16/2020   Seizure (HCC)    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   Suicide ideation    Syphilis 02/26/2018   Tendinopathy of left shoulder 01/18/2019   TOA (tubo-ovarian abscess) 04/15/2021   Tuberculosis    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   Tubo-ovarian abscess 04/25/2021   Vertigo    Wears glasses    Past Surgical History:  Procedure Laterality Date   APPENDECTOMY  ~ 2000   APPLICATION OF CRANIAL NAVIGATION N/A 05/16/2020   Procedure: APPLICATION OF CRANIAL NAVIGATION;  Surgeon: Tressie Stalker, MD;  Location[facility];  Service: Neurosurgery;  Laterality: N/A;   CRANIOTOMY Right 05/16/2020   Procedure: Craniotomy for Resection of Lesion;  Surgeon: Tressie Stalker, MD;  Location: [facility] OR;  Service: Neurosurgery;  Laterality: Right;  right   CYSTOSCOPY WITH RETROGRADE PYELOGRAM, URETEROSCOPY AND STENT PLACEMENT Left 01/05/2023   Procedure: CYSTOSCOPY WITH BILATERAL RETROGRADE PYELOGRAM, LEFT DIAGNOSTIC URETEROSCOPY AND BLADDER BIOPSY;  Surgeon: Jannifer Hick, MD;  Location: [facility] ORS;  Service: Urology;  Laterality: Left;  30 MINUTES NEEDED FOR CASE  ANESTHESIA INTUBATED, PARALYZED   DILATION AND CURETTAGE  OF UTERUS  12/15/2006   ESOPHAGOGASTRODUODENOSCOPY  03/11/2012   Procedure: ESOPHAGOGASTRODUODENOSCOPY (EGD);  Surgeon: Pietro Bridegroom, MD;  Location: [facility] ENDOSCOPY;  Service: Endoscopy;  Laterality: N/A;   ESOPHAGOGASTRODUODENOSCOPY N/A 03/07/2014   Procedure: ESOPHAGOGASTRODUODENOSCOPY (EGD);  Surgeon: Kenney Peacemaker, MD;  Location: [facility] ENDOSCOPY;  Service: Endoscopy;  Laterality: N/A;   LAPAROSCOPIC OOPHERECTOMY  05/2021   [facility] - [facility] and right salpingectomy   LUNG BIOPSY  02/13/2012   Salpingectomy Left 05/2021   TOTAL HIP ARTHROPLASTY Left 12/14/2015    Procedure: LEFT TOTAL HIP ARTHROPLASTY ANTERIOR APPROACH;  Surgeon: Arnie Lao, MD;  Location: [facility];  Service: Orthopedics;  Laterality: Left;   TOTAL HIP ARTHROPLASTY Right 04/04/2016   Procedure: RIGHT TOTAL HIP ARTHROPLASTY ANTERIOR APPROACH;  Surgeon: Arnie Lao, MD;  Location: [facility];  Service: Orthopedics;  Laterality: Right;   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 05/09/2020   Hypertension    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HCC)    Seizure (HCC)    Toxoplasmosis 11/07/2019   AIDS (acquired immune deficiency syndrome) (HCC) 11/07/2019   Chronic pelvic pain in female 01/04/2019   Tuberculosis    Complex regional pain syndrome 02/03/2017   Headache 10/28/2016   Primary adrenal insufficiency (HCC) 01/03/2015   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    PCP: Pa, [facility]   REFERRING PROVIDER: Bronson Canny, PA-C   REFERRING DIAG:  [phone] (ICD-10-CM) - Pain in right hip  M54.41,G89.29 (ICD-10-CM) - Chronic right-sided low back pain with right-sided sciatica    Rationale for Evaluation and Treatment: Rehabilitation  THERAPY DIAG:  Pain in right hip  Chronic right-sided low back pain with right-sided sciatica  Difficulty in walking, not elsewhere classified  ONSET DATE: 3months December 2024  SUBJECTIVE:                                                                                                                                                                                           SUBJECTIVE STATEMENT:  03/30/2024: I did not go to land therapy the other day because I had a  headache and I hurt too much to go.  4/10 pain today. I did not take pain meds today.  I dont' think the shot helped at all   EVAL- I have been having back  pain about 2-3 months with a pinching pain in my low back depending on how I bend forward. I cannot stand for 10-15 min.  I always have pain when I bend over especially returning to upright  My second issue is my both my hips are THA because of Avascular necrosis and I am good with my left hip but my Right hip gives me problems. I recently on Monday March 23rd received  an injection.  I cannot work due to the pain. 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 Headaches, HIV see medical hx  PAIN:  Are you having pain? Yes: NPRS scale: at rest hip with medicine 3-4/10 but at worst  9-10/10, back at rest 0/10 but at worst 8/10 Pain location: Right back and right hip Pain description: sharp pinching pain in back and right hip Aggravating factors: standing for more than 5 min, bending over, cannot stand after 15 min, cannot sleep on Right side with increasing numbness in hip, steps and dressing getting in and out of car Relieving factors: medicine and resting  PRECAUTIONS: Other: HIV  RED FLAGS: None   WEIGHT BEARING RESTRICTIONS: Yes WBAT bil  FALLS:  Has patient fallen in last 6 months? 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  PLOF: Independent  PATIENT GOALS:  be able to walk and stand and sleep more comfortably to complete school and  work  NEXT MD VISIT: TBD  OBJECTIVE:  Note: Objective measures were completed at Evaluation unless otherwise noted.  DIAGNOSTIC FINDINGS:  Imaging: XR Lumbar Spine 2-3 Views Result Date: 02/24/2024 Lumbar spine 2 views: No acute fractures displays well-maintained.  No arthropathic changes.  Normal lordotic curvature.  No spondylolisthesis.  No acute findings.  Status post bilateral hips acetabular components are seen partially on the AP view.  There is no evidence of complicating features of the acetabular components of bilateral hips.  PATIENT SURVEYS:  Modified Oswestry 58%   COGNITION: Overall cognitive status: Within functional limits for tasks assessed     SENSATION: WFL  MUSCLE LENGTH: Hamstrings: Right 56 deg; Left 70 deg Thomas test: Right 20 degrees from horizontal  deg; Left 0 from horizontal deg  POSTURE: rounded shoulders, forward head, and weight shift left Left knee more valgus than Right PALPATION: TTP over posterior/lateral iliac crest, marked TTP over SI Right joint and elevated compared to right  LUMBAR ROM:   AROM eval  Flexion Fingertip to mid shin * especially returning to stand  Extension 30% *  Right lateral flexion 30%*  Left lateral flexion 90%  Right rotation 50%  Left rotation 90%   (Blank rows = not tested)  LOWER EXTREMITY ROM:     Active  Right eval Left eval  Hip flexion 90 with pinching pain 105  Hip extension    Hip abduction    Hip adduction    Hip internal rotation    Hip external rotation    Knee flexion 115 132  Knee extension  Ankle dorsiflexion    Ankle plantarflexion    Ankle inversion    Ankle eversion     Key: WFL = within functional limits not formally assessed, * = concordant pain, s = stiffness/stretching sensation, NT = not tested)  LOWER EXTREMITY MMT:    MMT Right eval Left eval  Hip flexion 3- to 90 degrees only 4+  Hip extension 4- 4+  Hip abduction 3+ 4+  Hip adduction    Hip internal rotation     Hip external rotation    Knee flexion    Knee extension    Ankle dorsiflexion    Ankle plantarflexion 18/25 25/25  Ankle inversion    Ankle eversion    (Blank rows = not tested, score listed is out of 5 possible points.  N = WNL, D = diminished, C = clear for gross weakness with myotome testing, * = concordant pain with testing)  LUMBAR SPECIAL TESTS:  Straight leg raise test: Negative, Slump test: Negative, and SI Compression/distraction test: Positive Right SI  FUNCTIONAL TESTS:  5 times sit to stand: 14.49 sec 2 minute walk test: 397.2 ft  Minimum norm 579 ft 6 minute walk test: TBD  GAIT: Distance walked: 150 Assistive device utilized: None Level of assistance: Complete Independence Comments: antalgic R gait, wt shift to left [facility] Adult PT Treatment:                                                DATE: 03-30-24 Aquatic therapy at [facility] [address] - therapeutic pool temp approximately 92 degrees.Pt enters building independently. Treatment took place in water 3.8 to  4 ft 8 in. deep depending upon activity.  Pt entered and exited the pool via stair and handrails independently. Pt pain level 4/10 at the initiation of treatment  Aquatic Exercise: On submerged bench and on edge of pool holding with one UE   Walking side stepping and high stepping across pool x 4 Squats with Aquatic DB IT band stretch for Right side against pool wall Yellow noodle cat cow Heel/ toe raises BIL  Hip ext/flex with knee straight  Hip abduction/adduction  IR/ER on R and L LE with UE support Hamstring curl Bicycle kicks x1' Reverse bicycle kicks x1' Flutter kicks x 1 '  Scissor kicks x 1' Hip circles CW/CCW  pt fatiguing quickly on Right Figure 4 piriformis stretch using wall x30" BIL  LAD of Right LE to relieve pain. Aqua stretch not needed to due to deep joint pain High marching and side stepping with rainbow DB across pool  for end of session    [facility] Adult PT Treatment   03/18/2024:  Therapeutic Exercise:  nu-step L5 44m while taking subjective and planning session with patient LTR - 20x Modified thomas stretch 1' x2 ea Supine isometric clam with cues for core contraction - 2x10 ea Seated roll out on P ball - C/L/R - 10x ea  Therapeutic Activity  Bridge on ball - 10x Standing hip abd - 2x10 ea Sit to stand from table - x10 Education: DOMS and progressive overload principles    [facility] Adult PT Treatment:  DATE: 03-16-24 Aquatic therapy at [facility] [address] - therapeutic pool temp approximately 92 degrees.Pt enters building independently. Treatment took place in water 3.8 to  4 ft 8 in. deep depending upon activity.  Pt entered and exited the pool via stair and handrails independently. Pt pain level 7/10 at the initiation of treatment   Shatoya was educated on  beneficial therapeutic effects of water while ambulating to acclimate to water walking forward, backward and side stepping.  Pt educated on neutral posture and hip hinging in seated position with water at chest level x 10 with stretch to low back and then x 10 with back at pool wall at external cue, VC for neck tucked to prevent hyperextension.   Aquatic Exercise: On submerged bench and on edge of pool holding with one UE   Walking side stepping and high stepping across pool x 4 Heel/ toe raises BIL   Hip ext/flex with knee straight  Hip abduction/adduction  Hamstring curl Hip circles CW/CCW  pt fatiguing quickly on Right Figure 4 piriformis stretch using wall x30" BIL Squats x 20 Gastroc stretch of foot on pool wall 30 " x 2 on R and L CARS on R and L LE with UE support Runners stretch bottom step x30" BIL Hamstring stretch bottom step x30" BIL Aqua stretch for Right hip and gluteals  TREATMENT DATE: eval and issue HEP                                                                                                                                  PATIENT EDUCATION:  Education details: POC Explanation of findings, issue HEP Person educated: Patient Education method: Explanation, Demonstration, Tactile cues, Verbal cues, and Handouts Education comprehension: verbalized understanding, returned demonstration, verbal cues required, tactile cues required, and needs further education  HOME EXERCISE PROGRAM: Access Code: [email or web address] URL: [email or web address] Date: 03/09/2024 Prepared by: Garen Lah  Exercises - Hip flexor stretch at edge of bed with SLR resisted  - 2 x daily - 7 x weekly - 1 sets - 3 reps - 30sec hold - Supine Lower Trunk Rotation  - 2 x daily - 7 x weekly - 1 sets - 5 reps - 20 sec hold  ASSESSMENT:  CLINICAL IMPRESSION:  Kelsey Rodriguez tolerated  aquatic session well with no adverse reaction. Pt started with a 4/10 pain and  increased to 5/10 in the water and after LAD and cramping at end of session.  Pt complains of deep joint ache.  Pt reports she will return to MD next week for more information. Pt reports moderate levels  of fatigue with exercises completed today with moderate level of exertion  Patient continues to benefit from skilled PT services on land and aquatic based and should be progressed as able to improve functional independence   EVAL-Patient is a 33 y.o. female who was seen today for physical therapy evaluation and treatment for Right back and right hip  pain with bil THA due to vascular necrosis and in past multiple medical issues.  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   OBJECTIVE IMPAIRMENTS: Abnormal gait, decreased activity tolerance, decreased mobility, decreased ROM, decreased strength, impaired flexibility, obesity, and pain.   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 Headaches, HIV see medical hx are also affecting patient's functional outcome.   REHAB POTENTIAL: Good  CLINICAL DECISION MAKING: Evolving/moderate complexity  EVALUATION COMPLEXITY: Moderate   GOALS: Goals reviewed with patient? Yes  SHORT TERM GOALS: Target date: 04-07-24  Pt will be independent with initial HEP Baseline:no knowledge Goal status: INITIAL  2.  Pt will be able to begin walking program to build endurance and tolerance for standing/walking Baseline: unable to stand greater than 5-15 min Goal status: INITIAL  3.   Pt will demonstrate Right lateral and R extension lumbar AROM in order to demonstrate improved tolerance to functional movement patterns.  Baseline: See AROM chart Goal status: INITIAL  4.  Pt will be able to perform 5x STS in 13 seconds or less to show improved LE strength Baseline: eval 14.49 sec Goal status: INITIAL    LONG TERM GOALS: Target date: 05-04-24  Pt will be independent with advanced HEP Baseline: no knowledge Goal status: INITIAL  2.  Pt will be able to dress with 50% greater ease than on eval Baseline: Pt must dress sitting down slowly and carefully at eval Goal status: INITIAL  3.  Pt will be able to sleep with reduced pain and on Right side without awaking due to pain Baseline: sleeps only every 2 hours and needs to reposition Goal status: INITIAL  4.  Pt will improve ODI to at least 45 % to show improved functional mobility Baseline: 58% eval Goal status: INITIAL  5.  Pt will be able to  stand for 30 min to complete cooking/school duties and show improvement in activity tolerance Baseline: Pt cannot stand for more than 5 min without pain Goal status: INITIAL    PLAN:  PT FREQUENCY: 1-2x/week  PT DURATION: 8 weeks  PLANNED INTERVENTIONS: [id number]- PT Re-evaluation, 97110-Therapeutic exercises, 97530- Therapeutic activity, 97112- Neuromuscular re-education, 97535- Self Care, 98119- Manual therapy, [id number]- Gait training, [phone]- Electrical stimulation (manual), Patient/Family education, Stair training, Taping, Dry Needling, Joint mobilization, Spinal mobilization, Cryotherapy, and Moist heat.  PLAN FOR NEXT SESSION:  Check R SI joint,  hip flexor stretching/ manual/ TPDN  aquatics  Sharlet Dawson, PT, [facility] Certified Exercise Expert for the Aging Adult  03/30/24 3:53 PM Phone: [phone] Fax: [phone]   Date of referral: 02-25-24 Referring provider: Bronson Canny, PA-C  Referring diagnosis?  M25.551 (ICD-10-CM) - Pain in right hip  M54.41,G89.29 (ICD-10-CM) - Chronic right-sided low back pain with right-sided sciatica   Treatment diagnosis? (if different than referring diagnosis) muscle weakness, SI joint with hip and back pain  What was this (referring dx) caused by? Other: bil THA  surgeries and ongoing back pain  Nature of Condition: Chronic (continuous duration > 3 months)   Laterality: Rt  Current Functional Measure Score: Other ODI  58%  Objective measurements identify impairments when they are compared to normal values, the uninvolved extremity, and prior level of function.  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  Cannot stand more than 5-15 min without severe pain  How did symptoms start: Avascular necrosis bil /surgery bil hips and chronic back  pain  Average pain intensity:  Last 24 hours: 8-10/10 without medication or injections  Past week: 8-10/10   How often does the pt experience symptoms? Constantly  How much have the symptoms interfered with usual daily activities? Quite a bit  How has condition changed since care began at this facility? NA - initial visit  In general, how is the patients overall health? Fair multiple medical issues   BACK PAIN (STarT Back Screening Tool) Has pain spread down the leg(s) at some time in the last 2 weeks? yes Has there been pain in the shoulder or neck at some time in the last 2 weeks? yes Has the pt only walked short distances because of back pain? Yes less than 5 min -15 min walking Has patient dressed more slowly because of back pain in the past 2 weeks? yes Does patient think it's not safe for a person with this condition to be physically active? Sometimes worried about falling but has not fallen in last 6 months Does patient have worrying thoughts a lot of the time? yes Does patient feel back pain is terrible and will never get any better? yes Has patient stopped enjoying things they usually enjoy? yes Overall, how bothersome has back pain been in the last 2 weeks?                    Very Much

## 2024-03-31 ENCOUNTER — Telehealth: Payer: Self-pay

## 2024-03-31 NOTE — Telephone Encounter (Signed)
 [facility] Patient Advocate Encounter  Patient's medications CABENUVA have been couriered to [facility] from [facility] and will be administered at the patients appointment on 04/06/24.  Verline Glow, CPhT Specialty Pharmacy Patient [facility] Phone: [phone] Fax:  [phone]

## 2024-04-05 NOTE — Progress Notes (Unsigned)
 HPI: Kelsey Rodriguez is a 33 y.o. female who presents to the [facility] pharmacy clinic for Cabenuva  administration.  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   AMITRIPTYLINE  (ELAVIL ) 25 MG TABLET    Take 1 tablet (25 mg total) by mouth at bedtime.   CABOTEGRAVIR  & RILPIVIRINE  ER (CABENUVA ) 400 & 600 MG/2ML INJECTION    Inject 1 kit into the muscle every 30 (thirty) days.   CLOTRIMAZOLE -BETAMETHASONE  (LOTRISONE ) CREAM    Apply topically 2 (two) times daily as needed.   DIPHENHYDRAMINE  (BENADRYL ) 25 MG TABLET    Take 25 mg by mouth every 6 (six) hours as needed (when taking oxycodone ).   DOCUSATE SODIUM  (COLACE) 100 MG CAPSULE    Take 1 capsule (100 mg total) by mouth daily as needed for up to 30 doses.   ENSURE (ENSURE)    Take 1 Can by mouth 2 (two) times daily between meals.   HYDROXYPROPYL METHYLCELLULOSE / HYPROMELLOSE (ISOPTO TEARS / GONIOVISC) 2.5 % OPHTHALMIC SOLUTION    Place 1 drop into both eyes as needed for dry eyes.   IBUPROFEN   (ADVIL ) 800 MG TABLET    Take 800 mg by mouth 2 (two) times daily as needed for moderate pain.   POLYETHYLENE GLYCOL 3350  (MIRALAX  PO)    Take 17 g by mouth daily.   SULFAMETHOXAZOLE -TRIMETHOPRIM  (BACTRIM  DS) 800-160 MG TABLET    Take 1 tablet by mouth daily.   TRANEXAMIC ACID  (LYSTEDA ) 650 MG TABS TABLET    Take 2 tablets (1,300 mg total) by mouth 3 (three) times daily. Take during menses for a maximum of five days   TRIAMCINOLONE  CREAM (KENALOG ) 0.1 %    SMARTSIG:1 Application Topical 2-3 Times Daily  Modified Medications   No medications on file  Discontinued Medications   No medications on file    Allergies: Allergies  Allergen Reactions   Hydrocodone  Itching and Nausea Only    Pt states she can tolerate with benadryl  and Tolerates Oxycodone    Tramadol  Itching and Nausea Only    Pt states she can tolerate with benadryl  and Tolerates oxycodone     Labs: Lab Results  Component Value Date   HIV1RNAQUANT NOT DETECTED 03/11/2024   HIV1RNAQUANT 86 (H) 02/17/2024   HIV1RNAQUANT 121 (H) 02/03/2024   CD4TABS <35 (L) 09/28/2023   CD4TABS 126 (L) 03/02/2023   CD4TABS 115 (L) 11/27/2022    RPR and STI Lab Results  Component Value Date   LABRPR REACTIVE (A) 03/11/2024   LABRPR REACTIVE (A) 09/28/2023   LABRPR REACTIVE (A)  11/27/2022   LABRPR REACTIVE (A) 08/12/2022   LABRPR Reactive (A) 04/12/2022   RPRTITER 1:1 (H) 03/11/2024   RPRTITER 1:1 (H) 09/28/2023   RPRTITER 1:1 (H) 11/27/2022   RPRTITER 1:1 (H) 08/12/2022   RPRTITER 1:1 (H) 02/28/2022    STI Results GC CT  09/28/2023 10:51 AM Negative  Negative   03/18/2022 11:17 AM Negative  Negative   01/02/2022  3:50 PM Negative  Negative   04/14/2021 10:35 PM Negative  Negative   11/01/2020  3:18 PM Negative  Negative   08/09/2018 12:00 AM Negative  Negative   02/18/2018 12:00 AM Negative  Negative   02/12/2018 12:00 AM Negative  **POSITIVE**   11/20/2017 12:00 AM Negative  Negative   05/23/2016 12:00 AM Negative  Negative    01/23/2015 12:00 AM NG: Negative  CT: Negative     Hepatitis B Lab Results  Component Value Date   HEPBSAB POS (A) 09/13/2015   HEPBSAG NEGATIVE 03/13/2012   HEPBCAB NEGATIVE 03/13/2012   Hepatitis C Lab Results  Component Value Date   HEPCAB NON-REACTIVE 03/17/2022   Hepatitis A Lab Results  Component Value Date   HAV POSITIVE (A) 03/13/2012   Lipids: Lab Results  Component Value Date   CHOL 228 (H) 03/11/2024   TRIG 112 03/11/2024   HDL 72 03/11/2024   CHOLHDL 3.2 03/11/2024   VLDL 29 12/25/2016   LDLCALC 134 (H) 03/11/2024    TARGET DATE: 26  Assessment: Kelsey Rodriguez presents today for monthly maintenance Cabenuva  injections. Past injections were tolerated well without issues. Last HIV RNA was undetectable on March 11 2024. Previously elevated between 80-120 since January. Previously poor adherence to Biktarvy , started on Cabenuva  in October 2024 with RNA 217,000 with plan to receive monthly injections until undetectable, then transitions to every 2 month schedule. *** Doing well with no issues today.  Last urine STI testing was in March 2025, negative.   Alternates drug injection sites. Administered cabotegravir  600mg /10mL in RIGHT upper outer quadrant of the gluteal muscle. Administered rilpivirine  900 mg/3mL in the LEFT upper outer quadrant of the gluteal muscle. No issues with injections. Kelsey Rodriguez will follow up in 2 months for next set of injections.  Eligible for Shingles vaccination. *** immunizations today.   Plan: - Cabenuva  injections administered - Next injections scheduled for *** 2 month? Next month? - Call with any issues or questions  Kristopher Pheasant PharmD Candidate

## 2024-04-06 ENCOUNTER — Encounter: Payer: Self-pay | Admitting: Physician Assistant

## 2024-04-06 ENCOUNTER — Ambulatory Visit (INDEPENDENT_AMBULATORY_CARE_PROVIDER_SITE_OTHER): Payer: 59 | Admitting: Pharmacist

## 2024-04-06 ENCOUNTER — Other Ambulatory Visit: Payer: Self-pay

## 2024-04-06 ENCOUNTER — Ambulatory Visit: Admitting: Physical Therapy

## 2024-04-06 ENCOUNTER — Ambulatory Visit (INDEPENDENT_AMBULATORY_CARE_PROVIDER_SITE_OTHER): Admitting: Physician Assistant

## 2024-04-06 DIAGNOSIS — M25551 Pain in right hip: Secondary | ICD-10-CM

## 2024-04-06 DIAGNOSIS — B20 Human immunodeficiency virus [HIV] disease: Secondary | ICD-10-CM | POA: Diagnosis not present

## 2024-04-06 DIAGNOSIS — M5441 Lumbago with sciatica, right side: Secondary | ICD-10-CM

## 2024-04-06 MED ORDER — CABOTEGRAVIR & RILPIVIRINE ER 400 & 600 MG/2ML IM SUER
1.0000 | Freq: Once | INTRAMUSCULAR | Status: AC
  Administered 2024-04-06: 1 via INTRAMUSCULAR

## 2024-04-06 NOTE — Progress Notes (Addendum)
 HPI: Kelsey Rodriguez is comes in today follow-up of her right hip and radicular symptoms down the right leg.  She states that the right hip iliopsoas start with injection with Kelsey Rodriguez gave her no relief at all.  She has gone to therapy including aquatic therapy for back and feels a gripping sensation whenever working with therapy.  Feels that therapy overall makes the pain worse.  And her pain is 9 out of 10 pain down the anterior aspect of the right leg with some numbness no tingling.  She has radicular symptoms goes down to just the lateral aspect of the lower leg.  She does have some low back pain particularly right lower back.  She has had no new injuries.  Otherwise her condition remains unchanged.  Review of systems: See HPI otherwise negative  Physical exam: General Well-developed well-nourished female in no acute distress able to get on and off the exam table on her own.  Ambulates without any assistive device. Respirations: Unlabored Psych: Alert and oriented x 3 Lower extremities: Good range of motion both hips external rotation of the right hip causes some discomfort anteriorly.  Tenderness over the trochanteric region both hips.  Straight leg raise positive on the right negative on the left. Lumbar spine: She comes within an inch female with touch her toes.  Has slightly limited extension lumbar spine.  Tenderness right lumbar paraspinous region.  Impression: History of bilateral total hip arthroplasty Low back pain with radiculopathy right leg  Plan: Given patient's failure of conservative measures which included hip injection under ultrasound by Kelsey Rodriguez, therapy, oral medications.  Recommend MRI of the lumbar spine to rule out HNP as a source of her radiculopathy down the right leg.  Follow-up with Kelsey Rodriguez after the MRI to go over results and discuss further treatment.  Questions were encouraged and answered at length.

## 2024-04-06 NOTE — Addendum Note (Signed)
 Addended by: Clancy Crimes B on: 04/06/2024 11:10 AM   Modules accepted: Orders

## 2024-04-08 ENCOUNTER — Encounter: Payer: Self-pay | Admitting: Physical Therapy

## 2024-04-08 ENCOUNTER — Ambulatory Visit: Admitting: Physical Therapy

## 2024-04-08 DIAGNOSIS — R262 Difficulty in walking, not elsewhere classified: Secondary | ICD-10-CM

## 2024-04-08 DIAGNOSIS — M25551 Pain in right hip: Secondary | ICD-10-CM

## 2024-04-08 DIAGNOSIS — G8929 Other chronic pain: Secondary | ICD-10-CM

## 2024-04-08 NOTE — Therapy (Signed)
 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   Bullae 05/30/2012   Cerebral edema (HCC) 10/28/2019   Cervical dysplasia, mild 12/06/2020   Chronic back pain    Chronic kidney disease    Chronic leg pain    bilateral knees, ankles   Chronic pelvic pain in female 01/04/2019   Complex regional pain syndrome 02/03/2017   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HCC)    Depression    Eczema 02/28/2022   Encephalitis, myelitis, 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   Fatigue    GERD (gastroesophageal reflux disease)    Headache    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   HIV (human immunodeficiency virus infection) (HCC) 02/2012   Hypertension    Laceration of ankle, right 11/18/2012   Lower abdominal pain 06/21/2018   Lumbar radiculopathy    Meningitis 02/18/2018   Pain of upper abdomen    Pelvic pain    PID (acute  pelvic inflammatory disease) 02/26/2018   Pneumonia    Reflux esophagitis 03/11/2012   Rotator cuff strain 01/26/2020   S/P craniotomy 05/16/2020   Seizure (HCC)    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   Suicide ideation    Syphilis 02/26/2018   Tendinopathy of left shoulder 01/18/2019   TOA (tubo-ovarian abscess) 04/15/2021   Tuberculosis    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   Tubo-ovarian abscess 04/25/2021   Vertigo    Wears glasses    Past Surgical History:  Procedure Laterality Date   APPENDECTOMY  ~ 2000   APPLICATION OF CRANIAL NAVIGATION N/A 05/16/2020   Procedure: APPLICATION OF CRANIAL NAVIGATION;  Surgeon: Garry Kansas, MD;  Location: [facility] OR;  Service: Neurosurgery;  Laterality: N/A;   CRANIOTOMY Right 05/16/2020   Procedure: Craniotomy for Resection of Lesion;  Surgeon: Garry Kansas, MD;  Location: [facility] OR;  Service: Neurosurgery;  Laterality: Right;  right   CYSTOSCOPY WITH RETROGRADE PYELOGRAM, URETEROSCOPY AND STENT PLACEMENT Left 01/05/2023   Procedure: CYSTOSCOPY WITH BILATERAL RETROGRADE PYELOGRAM, LEFT DIAGNOSTIC URETEROSCOPY AND BLADDER BIOPSY;  Surgeon: Lahoma Pigg, MD;  Location: [facility] ORS;  Service: Urology;  Laterality: Left;  30 MINUTES NEEDED FOR CASE  ANESTHESIA INTUBATED, PARALYZED   DILATION AND CURETTAGE  OF UTERUS  12/15/2006   ESOPHAGOGASTRODUODENOSCOPY  03/11/2012   Procedure: ESOPHAGOGASTRODUODENOSCOPY (EGD);  Surgeon: Pietro Bridegroom, MD;  Location: [facility] ENDOSCOPY;  Service: Endoscopy;  Laterality: N/A;   ESOPHAGOGASTRODUODENOSCOPY N/A 03/07/2014   Procedure: ESOPHAGOGASTRODUODENOSCOPY (EGD);  Surgeon: Kenney Peacemaker, MD;  Location: [facility] ENDOSCOPY;  Service: Endoscopy;  Laterality: N/A;   LAPAROSCOPIC OOPHERECTOMY  05/2021   [facility] - [facility] and right salpingectomy   LUNG BIOPSY  02/13/2012   Salpingectomy Left 05/2021   TOTAL HIP ARTHROPLASTY Left 12/14/2015    Procedure: LEFT TOTAL HIP ARTHROPLASTY ANTERIOR APPROACH;  Surgeon: Arnie Lao, MD;  Location: [facility]S;  Service: Orthopedics;  Laterality: Left;   TOTAL HIP ARTHROPLASTY Right 04/04/2016   Procedure: RIGHT TOTAL HIP ARTHROPLASTY ANTERIOR APPROACH;  Surgeon: Arnie Lao, MD;  Location: [facility] ORS;  Service: Orthopedics;  Laterality: Right;   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 05/09/2020   Hypertension    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HCC)    Seizure (HCC)    Toxoplasmosis 11/07/2019   AIDS (acquired immune deficiency syndrome) (HCC) 11/07/2019   Chronic pelvic pain in female 01/04/2019   Tuberculosis    Complex regional pain syndrome 02/03/2017   Headache 10/28/2016   Primary adrenal insufficiency (HCC) 01/03/2015   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    PCP: Pa, [facility]   REFERRING PROVIDER: Bronson Canny, PA-C   REFERRING DIAG:  [phone] (ICD-10-CM) - Pain in right hip  M54.41,G89.29 (ICD-10-CM) - Chronic right-sided low back pain with right-sided sciatica    Rationale for Evaluation and Treatment: Rehabilitation  THERAPY DIAG:  Pain in right hip  Chronic right-sided low back pain with right-sided sciatica  Difficulty in walking, not elsewhere classified  ONSET DATE: 3months December 2024  SUBJECTIVE:                                                                                                                                                                                           SUBJECTIVE STATEMENT:  04/08/2024: Pt reports that she continues to have pain in her back when  bending.  She has an MRI scheduled for Monday morning.   EVAL- I have been having back pain about 2-3 months with a pinching pain in my low back depending on how I bend forward. I cannot  stand for 10-15 min.  I always have pain when I bend over especially returning to upright  My second issue is my both my hips are THA because of Avascular necrosis and I am good with my left hip but my Right hip gives me problems. I recently on Monday March 23rd received  an injection.  I cannot work due to the pain. 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 Headaches, HIV see medical hx  PAIN:  Are you having pain? Yes: NPRS scale: at rest hip with medicine 3-4/10 but at worst  9-10/10, back at rest 0/10 but at worst 8/10 Pain location: Right back and right hip Pain description: sharp pinching pain in back and right hip Aggravating factors: standing for more than 5 min, bending over, cannot stand after 15 min, cannot sleep on Right side with increasing numbness in hip, steps and dressing getting in and out of car Relieving factors: medicine and resting  PRECAUTIONS: Other: HIV  RED FLAGS: None   WEIGHT BEARING RESTRICTIONS: Yes WBAT bil  FALLS:  Has patient fallen in last 6 months? 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  PLOF: Independent  PATIENT GOALS:  be able to walk and stand and sleep more comfortably to complete school and work  NEXT MD VISIT: TBD  OBJECTIVE:  Note: Objective measures were  completed at Evaluation unless otherwise noted.  DIAGNOSTIC FINDINGS:  Imaging: XR Lumbar Spine 2-3 Views Result Date: 02/24/2024 Lumbar spine 2 views: No acute fractures displays well-maintained.  No arthropathic changes.  Normal lordotic curvature.  No spondylolisthesis.  No acute findings.  Status post bilateral hips acetabular components are seen partially on the AP view.  There is no evidence of complicating features of the acetabular components of bilateral hips.  PATIENT SURVEYS:  Modified Oswestry 58%   COGNITION: Overall cognitive status: Within functional limits for tasks assessed     SENSATION: WFL  MUSCLE LENGTH: Hamstrings: Right 56 deg; Left 70 deg Thomas test: Right 20 degrees from horizontal  deg; Left 0 from horizontal deg  POSTURE: rounded shoulders, forward head, and weight shift left Left knee more valgus than Right PALPATION: TTP over posterior/lateral iliac crest, marked TTP over SI Right joint and elevated compared to right  LUMBAR ROM:   AROM eval  Flexion Fingertip to mid shin * especially returning to stand  Extension 30% *  Right lateral flexion 30%*  Left lateral flexion 90%  Right rotation 50%  Left rotation 90%   (Blank rows = not tested)  LOWER EXTREMITY ROM:     Active  Right eval Left eval  Hip flexion 90 with pinching pain 105  Hip extension    Hip abduction    Hip adduction    Hip internal rotation    Hip external rotation    Knee flexion 115 132  Knee extension    Ankle dorsiflexion    Ankle plantarflexion    Ankle inversion    Ankle eversion  Key: WFL = within functional limits not formally assessed, * = concordant pain, s = stiffness/stretching sensation, NT = not tested)  LOWER EXTREMITY MMT:    MMT Right eval Left eval  Hip flexion 3- to 90 degrees only 4+  Hip extension 4- 4+  Hip abduction 3+ 4+  Hip adduction    Hip internal rotation    Hip external rotation    Knee flexion    Knee extension    Ankle  dorsiflexion    Ankle plantarflexion 18/25 25/25  Ankle inversion    Ankle eversion    (Blank rows = not tested, score listed is out of 5 possible points.  N = WNL, D = diminished, C = clear for gross weakness with myotome testing, * = concordant pain with testing)  LUMBAR SPECIAL TESTS:  Straight leg raise test: Negative, Slump test: Negative, and SI Compression/distraction test: Positive Right SI  FUNCTIONAL TESTS:  5 times sit to stand: 14.49 sec 2 minute walk test: 397.2 ft  Minimum norm 579 ft 6 minute walk test: TBD  GAIT: Distance walked: 150 Assistive device utilized: None Level of assistance: Complete Independence Comments: antalgic R gait, wt shift to left   [facility] Adult PT Treatment  04/08/2024:  Therapeutic Exercise:  nu-step L5 13m while taking subjective and planning session with patient LTR - 20x Supine isometric clam with cues for core contraction - 3x10 ea Supine hip adduction squeeze - 5'' - 2x10  Therapeutic Activity  Bridge on ball - 2x10 Standing hip abd - 2x10 ea Sit to stand from table - 2x10 Step up - 2x10 ea 4'' step, 10x lat  [facility] Adult PT Treatment:                                                DATE: 03-30-24 Aquatic therapy at [facility] [address] - therapeutic pool temp approximately 92 degrees.Pt enters building independently. Treatment took place in water  3.8 to  4 ft 8 in. deep depending upon activity.  Pt entered and exited the pool via stair and handrails independently. Pt pain level 4/10 at the initiation of treatment  Aquatic Exercise: On submerged bench and on edge of pool holding with one UE   Walking side stepping and high stepping across pool x 4 Squats with Aquatic DB IT band stretch for Right side against pool wall Yellow noodle cat cow Heel/ toe raises BIL  Hip ext/flex with knee straight  Hip abduction/adduction  IR/ER on R and L LE with UE support Hamstring curl Bicycle kicks x1' Reverse bicycle kicks x1' Flutter  kicks x 1 '  Scissor kicks x 1' Hip circles CW/CCW  pt fatiguing quickly on Right Figure 4 piriformis stretch using wall x30" BIL  LAD of Right LE to relieve pain. Aqua stretch not needed to due to deep joint pain High marching and side stepping with rainbow DB across pool  for end of session    [facility] Adult PT Treatment  03/18/2024:  Therapeutic Exercise:  nu-step L5 74m while taking subjective and planning session with patient LTR - 20x Modified thomas stretch 1' x2 ea Supine isometric clam with cues for core contraction - 2x10 ea Seated roll out on P ball - C/L/R - 10x ea  Therapeutic Activity  Bridge on ball - 10x Standing hip abd - 2x10 ea Sit to stand from  table - x10 Education: DOMS and progressive overload principles    [facility] Adult PT Treatment:                                                DATE: 03-16-24 Aquatic therapy at [facility] [address] - therapeutic pool temp approximately 92 degrees.Pt enters building independently. Treatment took place in water  3.8 to  4 ft 8 in. deep depending upon activity.  Pt entered and exited the pool via stair and handrails independently. Pt pain level 7/10 at the initiation of treatment   Kelsey Rodriguez was educated on  beneficial therapeutic effects of water  while ambulating to acclimate to water  walking forward, backward and side stepping.  Pt educated on neutral posture and hip hinging in seated position with water  at chest level x 10 with stretch to low back and then x 10 with back at pool wall at external cue, VC for neck tucked to prevent hyperextension.   Aquatic Exercise: On submerged bench and on edge of pool holding with one UE   Walking side stepping and high stepping across pool x 4 Heel/ toe raises BIL   Hip ext/flex with knee straight  Hip abduction/adduction  Hamstring curl Hip circles CW/CCW  pt fatiguing quickly on Right Figure 4 piriformis stretch using wall x30" BIL Squats x 20 Gastroc stretch of foot on pool wall  30 " x 2 on R and L CARS on R and L LE with UE support Runners stretch bottom step x30" BIL Hamstring stretch bottom step x30" BIL Aqua stretch for Right hip and gluteals  TREATMENT DATE: eval and issue HEP                                                                                                                                 PATIENT EDUCATION:  Education details: POC Explanation of findings, issue HEP Person educated: Patient Education method: Explanation, Demonstration, Tactile cues, Verbal cues, and Handouts Education comprehension: verbalized understanding, returned demonstration, verbal cues required, tactile cues required, and needs further education  HOME EXERCISE PROGRAM: Access Code: [email or web address] URL: [email or web address] Date: 03/09/2024 Prepared by: Sharlet Dawson  Exercises - Hip flexor stretch at edge of bed with SLR resisted  - 2 x daily - 7 x weekly - 1 sets - 3 reps - 30sec hold - Supine Lower Trunk Rotation  - 2 x daily - 7 x weekly - 1 sets - 5 reps - 20 sec hold  ASSESSMENT:  CLINICAL IMPRESSION:  Felicie tolerated session well with no adverse reaction.  She continues to have significant pain with lumbar and hip flexion.  We concentrated on on building strength and endurance of hip and core muscles today.  Tolerated increased volume compared to previous visit.   EVAL-Patient is a 33 y.o. female who was seen  today for physical therapy evaluation and treatment for Right back and right hip pain with bil THA due to vascular necrosis and in past multiple medical issues.  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   OBJECTIVE IMPAIRMENTS: Abnormal gait, decreased activity tolerance, decreased mobility, decreased ROM, decreased strength,  impaired flexibility, obesity, and pain.   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 Headaches, HIV see medical hx are also affecting patient's functional outcome.   REHAB POTENTIAL: Good  CLINICAL DECISION MAKING: Evolving/moderate complexity  EVALUATION COMPLEXITY: Moderate   GOALS: Goals reviewed with patient? Yes  SHORT TERM GOALS: Target date: 04-07-24  Pt will be independent with initial HEP Baseline:no knowledge Goal status: INITIAL  2.  Pt will be able to begin walking program to build endurance and tolerance for standing/walking Baseline: unable to stand greater than 5-15 min Goal status: INITIAL  3.   Pt will demonstrate Right lateral and R extension lumbar AROM in order to demonstrate improved tolerance to functional movement patterns.  Baseline: See AROM chart Goal status: INITIAL  4.  Pt will be able to perform 5x STS in 13 seconds or less to show improved LE strength Baseline: eval 14.49 sec Goal status: INITIAL    LONG TERM GOALS: Target date: 05-04-24  Pt will be independent with advanced HEP Baseline: no knowledge Goal status: INITIAL  2.  Pt will be able to dress with 50% greater ease than on eval Baseline: Pt must dress sitting down slowly and carefully at eval Goal status: INITIAL  3.  Pt will be able to sleep with reduced pain and on Right side without awaking due to pain Baseline: sleeps only every 2 hours and needs to reposition Goal status: INITIAL  4.  Pt will improve ODI to at least 45 % to show improved functional  mobility Baseline: 58% eval Goal status: INITIAL  5.  Pt will be able to stand for 30 min to complete cooking/school duties and show improvement in activity tolerance Baseline: Pt cannot stand for more than 5 min without pain Goal status: INITIAL    PLAN:  PT FREQUENCY: 1-2x/week  PT DURATION: 8 weeks  PLANNED INTERVENTIONS: [id number]- PT Re-evaluation, 97110-Therapeutic exercises, 97530- Therapeutic activity, 97112- Neuromuscular re-education, 97535- Self Care, 24401- Manual therapy, [id number]- Gait training, [phone]- Electrical stimulation (manual), Patient/Family education, Stair training, Taping, Dry Needling, Joint mobilization, Spinal mobilization, Cryotherapy, and Moist heat.  PLAN FOR NEXT SESSION:  Check R SI joint,  hip flexor stretching/ manual/ TPDN  aquatics  Marquis Sitter PT 04/08/24 12:32 PM Phone: [phone] Fax: [phone]   Date of referral: 02-25-24 Referring provider: Bronson Canny, PA-C  Referring diagnosis?  M25.551 (ICD-10-CM) - Pain in right hip  M54.41,G89.29 (ICD-10-CM) - Chronic right-sided low back pain with right-sided sciatica   Treatment diagnosis? (if different than referring diagnosis) muscle weakness, SI joint with hip and back pain  What was this (referring dx)  caused by? Other: bil THA surgeries and ongoing back pain  Nature of Condition: Chronic (continuous duration > 3 months)   Laterality: Rt  Current Functional Measure Score: Other ODI  58%  Objective measurements identify impairments when they are compared to normal values, the uninvolved extremity, and prior level of function.  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  Cannot stand more than 5-15 min without severe pain  How did symptoms start: Avascular necrosis bil /surgery bil hips  and chronic back pain  Average pain intensity:  Last 24 hours: 8-10/10 without medication or injections  Past week: 8-10/10   How often does the pt experience symptoms? Constantly  How much have the symptoms interfered with usual daily activities? Quite a bit  How has condition changed since care began at this facility? NA - initial visit  In general, how is the patients overall health? Fair multiple medical issues   BACK PAIN (STarT Back Screening Tool) Has pain spread down the leg(s) at some time in the last 2 weeks? yes Has there been pain in the shoulder or neck at some time in the last 2 weeks? yes Has the pt only walked short distances because of back pain? Yes less than 5 min -15 min walking Has patient dressed more slowly because of back pain in the past 2 weeks? yes Does patient think it's not safe for a person with this condition to be physically active? Sometimes worried about falling but has not fallen in last 6 months Does patient have worrying thoughts a lot of the time? yes Does patient feel back pain is terrible and will never get any better? yes Has patient stopped enjoying things they usually enjoy? yes Overall, how bothersome has back pain been in the last 2 weeks?                    Very Much

## 2024-04-09 LAB — HIV-1 RNA QUANT-NO REFLEX-BLD
HIV 1 RNA Quant: 53 {copies}/mL — ABNORMAL HIGH
HIV-1 RNA Quant, Log: 1.72 {Log_copies}/mL — ABNORMAL HIGH

## 2024-04-11 ENCOUNTER — Ambulatory Visit
Admission: RE | Admit: 2024-04-11 | Discharge: 2024-04-11 | Disposition: A | Discharge status: Home / Self Care | Source: Ambulatory Visit | Attending: Physician Assistant | Admitting: Physician Assistant

## 2024-04-11 DIAGNOSIS — M5441 Lumbago with sciatica, right side: Secondary | ICD-10-CM

## 2024-04-12 NOTE — Therapy (Incomplete)
 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   Bullae 05/30/2012   Cerebral edema (HCC) 10/28/2019   Cervical dysplasia, mild 12/06/2020   Chronic back pain    Chronic kidney disease    Chronic leg pain    bilateral knees, ankles   Chronic pelvic pain in female 01/04/2019   Complex regional pain syndrome 02/03/2017   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HCC)    Depression    Eczema 02/28/2022   Encephalitis, myelitis, 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   Fatigue    GERD (gastroesophageal reflux disease)    Headache    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   HIV (human immunodeficiency virus infection) (HCC) 02/2012   Hypertension    Laceration of ankle, right 11/18/2012   Lower abdominal pain 06/21/2018   Lumbar radiculopathy    Meningitis 02/18/2018   Pain of upper abdomen    Pelvic pain    PID (acute pelvic inflammatory disease) 02/26/2018   Pneumonia    Reflux esophagitis 03/11/2012   Rotator cuff strain 01/26/2020   S/P craniotomy 05/16/2020   Seizure (HCC)    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   Suicide ideation    Syphilis 02/26/2018   Tendinopathy of left shoulder 01/18/2019   TOA  (tubo-ovarian abscess) 04/15/2021   Tuberculosis    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   Tubo-ovarian abscess 04/25/2021   Vertigo    Wears glasses    Past Surgical History:  Procedure Laterality Date   APPENDECTOMY  ~ 2000   APPLICATION OF CRANIAL NAVIGATION N/A 05/16/2020   Procedure: APPLICATION OF CRANIAL NAVIGATION;  Surgeon: Garry Kansas, MD;  Location: [facility] OR;  Service: Neurosurgery;  Laterality: N/A;   CRANIOTOMY Right 05/16/2020   Procedure: Craniotomy for Resection of Lesion;  Surgeon: Garry Kansas, MD;  Location: [facility] OR;  Service: Neurosurgery;  Laterality: Right;  right   CYSTOSCOPY WITH RETROGRADE PYELOGRAM, URETEROSCOPY AND STENT PLACEMENT Left 01/05/2023   Procedure: CYSTOSCOPY WITH BILATERAL RETROGRADE PYELOGRAM, LEFT DIAGNOSTIC URETEROSCOPY AND BLADDER BIOPSY;  Surgeon: Lahoma Pigg, MD;  Location: [facility] ORS;  Service: Urology;  Laterality: Left;  30 MINUTES NEEDED FOR CASE  ANESTHESIA INTUBATED, PARALYZED   DILATION AND CURETTAGE OF UTERUS  12/15/2006   ESOPHAGOGASTRODUODENOSCOPY  03/11/2012   Procedure: ESOPHAGOGASTRODUODENOSCOPY (EGD);  Surgeon: Pietro Bridegroom, MD;  Location: [facility] ENDOSCOPY;  Service: Endoscopy;  Laterality: N/A;   ESOPHAGOGASTRODUODENOSCOPY N/A 03/07/2014   Procedure: ESOPHAGOGASTRODUODENOSCOPY (EGD);  Surgeon: Kenney Peacemaker, MD;  Location: [facility] ENDOSCOPY;  Service: Endoscopy;  Laterality: N/A;   LAPAROSCOPIC OOPHERECTOMY  05/2021   [facility] - [facility] and right salpingectomy   LUNG BIOPSY  02/13/2012   Salpingectomy Left 05/2021   TOTAL HIP ARTHROPLASTY Left 12/14/2015  Procedure: LEFT TOTAL HIP ARTHROPLASTY ANTERIOR APPROACH;  Surgeon: Arnie Lao, MD;  Location: [facility];  Service: Orthopedics;  Laterality: Left;   TOTAL HIP ARTHROPLASTY Right 04/04/2016   Procedure: RIGHT TOTAL HIP ARTHROPLASTY ANTERIOR APPROACH;  Surgeon: Arnie Lao, MD;  Location: [facility] OR[facility];  Service: Orthopedics;  Laterality: Right;   Patient  Active Problem List   Diagnosis Date Noted   Routine screening for STI (sexually transmitted infection) 03/11/2024   Encounter for long-term (current) use of high-risk medication 03/11/2024   Dysplasia of cervix, low grade (CIN 1) 11/23/2023   Eczema 02/28/2022   S/P craniotomy 05/16/2020   Headache due to intracranial disease 05/09/2020   Hypertension    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HCC)    Seizure (HCC)    Toxoplasmosis 11/07/2019   AIDS (acquired immune deficiency syndrome) (HCC) 11/07/2019   Chronic pelvic pain in female 01/04/2019   Tuberculosis    Complex regional pain syndrome 02/03/2017   Headache 10/28/2016   Primary adrenal insufficiency (HCC) 01/03/2015   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  ONSET DATE: 3months December 2024  SUBJECTIVE:                                                                                                                                                                                           SUBJECTIVE STATEMENT:  04/12/2024: Pt reports that she continues to have pain in her back when bending.  She has an MRI scheduled for Monday morning.   EVAL- I have been having back pain about 2-3 months with a pinching pain in my low back depending on how I bend forward. I cannot stand for 10-15 min.  I always have pain when I bend over especially returning to upright  My second issue is my both my hips are THA because of Avascular necrosis and I am good with my left hip but my Right hip gives me problems. I recently on Monday March 23rd received  an injection.  I cannot  work due to the pain. 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 Headaches, HIV see medical hx  PAIN:  Are you having pain? Yes: NPRS scale: at rest hip with medicine 3-4/10 but at worst  9-10/10, back at rest 0/10 but at worst 8/10 Pain location: Right back and right hip Pain description: sharp pinching pain in back and right hip Aggravating factors: standing for more than 5 min, bending over, cannot stand after 15 min, cannot sleep on Right side with increasing numbness in hip, steps and dressing getting in and out of car Relieving factors: medicine and resting  PRECAUTIONS: Other: HIV  RED FLAGS: None   WEIGHT BEARING RESTRICTIONS: Yes WBAT bil  FALLS:  Has patient fallen in last 6 months? 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  PLOF: Independent  PATIENT GOALS:  be able to walk and stand and sleep more comfortably to complete school and work  NEXT MD VISIT: TBD  OBJECTIVE:  Note: Objective measures were completed at Evaluation unless otherwise noted.  DIAGNOSTIC FINDINGS:  Imaging: XR Lumbar Spine 2-3 Views Result Date: 02/24/2024 Lumbar spine 2 views: No acute fractures displays well-maintained.  No arthropathic changes.  Normal lordotic curvature.  No spondylolisthesis.  No acute findings.  Status post bilateral hips acetabular components are seen partially on the AP view.  There is no evidence of complicating features of the acetabular components of bilateral  hips.  PATIENT SURVEYS:  Modified Oswestry 58%   COGNITION: Overall cognitive status: Within functional limits for tasks assessed     SENSATION: WFL  MUSCLE LENGTH: Hamstrings: Right 56 deg; Left 70 deg Thomas test: Right 20 degrees from horizontal  deg; Left 0 from horizontal deg  POSTURE: rounded shoulders, forward head, and weight shift left Left knee more valgus than Right PALPATION: TTP over posterior/lateral iliac crest, marked TTP over SI Right joint and elevated compared to right  LUMBAR ROM:   AROM eval  Flexion Fingertip to mid shin * especially returning to stand  Extension 30% *  Right lateral flexion 30%*  Left lateral flexion 90%  Right rotation 50%  Left rotation 90%   (Blank rows = not tested)  LOWER EXTREMITY ROM:     Active  Right eval Left eval  Hip flexion 90 with pinching pain 105  Hip extension    Hip abduction    Hip adduction    Hip internal rotation    Hip external rotation    Knee flexion 115 132  Knee extension    Ankle dorsiflexion    Ankle plantarflexion    Ankle inversion    Ankle eversion     Key: WFL = within functional limits not formally assessed, * = concordant pain, s = stiffness/stretching sensation, NT = not tested)  LOWER EXTREMITY MMT:    MMT Right eval Left eval  Hip flexion 3- to 90 degrees only 4+  Hip extension 4- 4+  Hip abduction 3+ 4+  Hip adduction    Hip internal rotation    Hip external rotation    Knee flexion    Knee extension    Ankle dorsiflexion    Ankle plantarflexion 18/25 25/25  Ankle inversion    Ankle eversion    (Blank rows = not tested,  score listed is out of 5 possible points.  N = WNL, D = diminished, C = clear for gross weakness with myotome testing, * = concordant pain with testing)  LUMBAR SPECIAL TESTS:  Straight leg raise test: Negative, Slump test: Negative, and SI Compression/distraction test: Positive Right SI  FUNCTIONAL TESTS:  5 times sit to stand: 14.49 sec 2 minute  walk test: 397.2 ft  Minimum norm 579 ft 6 minute walk test: TBD  GAIT: Distance walked: 150 Assistive device utilized: None Level of assistance: Complete Independence Comments: antalgic R gait, wt shift to left  [facility] Adult PT Treatment:                                                DATE: *** HEP [facility] Adult PT Treatment  04/08/2024:  Therapeutic Exercise:  nu-step L5 80m while taking subjective and planning session with patient LTR - 20x Supine isometric clam with cues for core contraction - 3x10 ea Supine hip adduction squeeze - 5'' - 2x10  Therapeutic Activity  Bridge on ball - 2x10 Standing hip abd - 2x10 ea Sit to stand from table - 2x10 Step up - 2x10 ea 4'' step, 10x lat  [facility] Adult PT Treatment:                                                DATE: 03-30-24 Aquatic therapy at [facility] [address] - therapeutic pool temp approximately 92 degrees.Pt enters building independently. Treatment took place in water  3.8 to  4 ft 8 in. deep depending upon activity.  Pt entered and exited the pool via stair and handrails independently. Pt pain level 4/10 at the initiation of treatment  Aquatic Exercise: On submerged bench and on edge of pool holding with one UE   Walking side stepping and high stepping across pool x 4 Squats with Aquatic DB IT band stretch for Right side against pool wall Yellow noodle cat cow Heel/ toe raises BIL  Hip ext/flex with knee straight  Hip abduction/adduction  IR/ER on R and L LE with UE support Hamstring curl Bicycle kicks x1' Reverse bicycle kicks x1' Flutter kicks x 1 '  Scissor kicks x 1' Hip circles CW/CCW  pt fatiguing quickly on Right Figure 4 piriformis stretch using wall x30" BIL  LAD of Right LE to relieve pain. Aqua stretch not needed to due to deep joint pain High marching and side stepping with rainbow DB across pool  for end of session    [facility] Adult PT Treatment  03/18/2024:  Therapeutic Exercise:  nu-step L5 94m  while taking subjective and planning session with patient LTR - 20x Modified thomas stretch 1' x2 ea Supine isometric clam with cues for core contraction - 2x10 ea Seated roll out on P ball - C/L/R - 10x ea  Therapeutic Activity  Bridge on ball - 10x Standing hip abd - 2x10 ea Sit to stand from table - x10 Education: DOMS and progressive overload principles    [facility] Adult PT Treatment:  DATE: 03-16-24 Aquatic therapy at [facility] [address] - therapeutic pool temp approximately 92 degrees.Pt enters building independently. Treatment took place in water  3.8 to  4 ft 8 in. deep depending upon activity.  Pt entered and exited the pool via stair and handrails independently. Pt pain level 7/10 at the initiation of treatment   Jamilett was educated on  beneficial therapeutic effects of water  while ambulating to acclimate to water  walking forward, backward and side stepping.  Pt educated on neutral posture and hip hinging in seated position with water  at chest level x 10 with stretch to low back and then x 10 with back at pool wall at external cue, VC for neck tucked to prevent hyperextension.   Aquatic Exercise: On submerged bench and on edge of pool holding with one UE   Walking side stepping and high stepping across pool x 4 Heel/ toe raises BIL   Hip ext/flex with knee straight  Hip abduction/adduction  Hamstring curl Hip circles CW/CCW  pt fatiguing quickly on Right Figure 4 piriformis stretch using wall x30" BIL Squats x 20 Gastroc stretch of foot on pool wall 30 " x 2 on R and L CARS on R and L LE with UE support Runners stretch bottom step x30" BIL Hamstring stretch bottom step x30" BIL Aqua stretch for Right hip and gluteals  TREATMENT DATE: eval and issue HEP                                                                                                                                 PATIENT EDUCATION:  Education  details: POC Explanation of findings, issue HEP Person educated: Patient Education method: Explanation, Demonstration, Tactile cues, Verbal cues, and Handouts Education comprehension: verbalized understanding, returned demonstration, verbal cues required, tactile cues required, and needs further education  HOME EXERCISE PROGRAM: Access Code: [email or web address] URL: [email or web address] Date: 03/09/2024 Prepared by: Sharlet Dawson  Exercises - Hip flexor stretch at edge of bed with SLR resisted  - 2 x daily - 7 x weekly - 1 sets - 3 reps - 30sec hold - Supine Lower Trunk Rotation  - 2 x daily - 7 x weekly - 1 sets - 5 reps - 20 sec hold  Aquatic Exercise Access Code: [facility] URL: [email or web address]  ASSESSMENT:  CLINICAL IMPRESSION:  Loise tolerated session well with no adverse reaction.  She continues to have significant pain with lumbar and hip flexion.  We concentrated on on building strength and endurance of hip and core muscles today.  Tolerated increased volume compared to previous visit.   EVAL-Patient is a 33 y.o. female who was seen today for physical therapy evaluation and treatment for Right back and right hip pain with bil THA due to vascular necrosis and in past multiple medical issues.  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   OBJECTIVE IMPAIRMENTS: Abnormal gait, decreased activity tolerance, decreased mobility, decreased ROM, decreased strength, impaired flexibility, obesity, and pain.   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 Headaches, HIV see medical hx are also affecting patient's functional outcome.   REHAB POTENTIAL: Good  CLINICAL DECISION MAKING: Evolving/moderate complexity  EVALUATION COMPLEXITY: Moderate   GOALS: Goals reviewed with patient? Yes  SHORT TERM GOALS: Target date: 04-07-24  Pt will be independent with initial HEP Baseline:no knowledge Goal status: INITIAL  2.  Pt will be able to begin walking program to build endurance and tolerance for standing/walking Baseline: unable to stand greater than 5-15 min Goal status: INITIAL  3.   Pt will demonstrate Right lateral and R extension lumbar AROM in order to demonstrate improved tolerance to functional movement patterns.  Baseline: See AROM chart Goal status: INITIAL  4.  Pt will be able to perform 5x STS in 13 seconds or less to show improved LE strength Baseline: eval 14.49 sec Goal status: INITIAL    LONG TERM GOALS: Target date: 05-04-24  Pt will be independent with advanced HEP Baseline: no knowledge Goal status: INITIAL  2.  Pt will be able to dress with 50% greater ease than on eval Baseline: Pt must dress sitting down slowly and carefully at eval Goal status: INITIAL  3.  Pt will be able to sleep with reduced pain and on Right side without awaking due to pain Baseline: sleeps only every 2 hours and needs to reposition Goal status: INITIAL  4.  Pt will improve ODI to at least 45 % to show improved functional mobility Baseline: 58% eval Goal status: INITIAL  5.  Pt will be able to stand for 30 min to complete cooking/school duties and show improvement in activity tolerance Baseline: Pt cannot stand for more than 5 min without pain Goal status: INITIAL    PLAN:  PT FREQUENCY:  1-2x/week  PT DURATION: 8 weeks  PLANNED INTERVENTIONS: 97164- PT Re-evaluation, 97110-Therapeutic exercises, 97530- Therapeutic activity, 97112- Neuromuscular re-education, 97535- Self Care, [id number]- Manual therapy, [id number]- Gait training, [phone]- Electrical stimulation (manual), Patient/Family education, Stair training, Taping, Dry Needling, Joint mobilization, Spinal mobilization, Cryotherapy, and Moist heat.  PLAN FOR NEXT SESSION:  Check R SI joint,  hip flexor stretching/ manual/ TPDN  aquatics  ***  Date of referral: 02-25-24 Referring provider: Bronson Canny, PA-C  Referring diagnosis?  M25.551 (ICD-10-CM) - Pain in right hip  M54.41,G89.29 (ICD-10-CM) - Chronic right-sided low back pain with right-sided sciatica   Treatment diagnosis? (if different than referring diagnosis) muscle weakness, SI joint with hip and back pain  What was this (referring dx) caused by? Other: bil THA surgeries and ongoing back pain  Nature of Condition: Chronic (continuous duration > 3 months)   Laterality: Rt  Current Functional Measure Score: Other ODI  58%  Objective measurements identify impairments when they are compared to normal values, the uninvolved extremity, and prior level of function.  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  Cannot stand more than 5-15 min without severe pain  How did symptoms start: Avascular necrosis bil /surgery bil hips and chronic back pain  Average pain intensity:  Last 24 hours: 8-10/10 without medication or injections  Past week: 8-10/10   How often does the pt experience symptoms? Constantly  How much have the symptoms interfered with usual daily activities? Quite a bit  How has condition changed since care began at this facility? NA - initial visit  In  general, how is the patients overall health? Fair multiple medical issues   BACK PAIN (STarT Back Screening Tool) Has pain spread down the leg(s) at some time in the last 2 weeks? yes Has there been pain in the shoulder or neck at some time in the last 2 weeks? yes Has the pt only walked short distances because of back pain? Yes less than 5 min -15 min walking Has patient dressed more slowly because of back pain in the past 2 weeks? yes Does patient think it's not safe for a person with this condition to be physically active? Sometimes worried about falling but has not fallen in last 6 months Does patient have worrying thoughts a lot of the time? yes Does patient feel back pain is terrible and will never get any better? yes Has patient stopped enjoying things they usually enjoy? yes Overall, how bothersome has back pain been in the last 2 weeks?                    Very Much

## 2024-04-13 ENCOUNTER — Ambulatory Visit: Admitting: Physical Therapy

## 2024-04-15 ENCOUNTER — Encounter: Payer: Self-pay | Admitting: Physical Therapy

## 2024-04-15 ENCOUNTER — Ambulatory Visit: Attending: Physician Assistant | Admitting: Physical Therapy

## 2024-04-15 DIAGNOSIS — M25551 Pain in right hip: Secondary | ICD-10-CM | POA: Insufficient documentation

## 2024-04-15 DIAGNOSIS — M5441 Lumbago with sciatica, right side: Secondary | ICD-10-CM | POA: Diagnosis present

## 2024-04-15 DIAGNOSIS — R262 Difficulty in walking, not elsewhere classified: Secondary | ICD-10-CM | POA: Diagnosis present

## 2024-04-15 DIAGNOSIS — G8929 Other chronic pain: Secondary | ICD-10-CM | POA: Insufficient documentation

## 2024-04-15 NOTE — Therapy (Addendum)
 PHYSICAL THERAPY UNPLANNED DISCHARGE SUMMARY   Visits from Start of Care: 6  Current functional level related to goals / functional outcomes: Current status unknown   Remaining deficits: Current status unknown   Education / Equipment: Pt has not returned since visit listed below  Patient goals were not assessed. Patient is being discharged due to not returning since the last visit.  (the note below was addended to include the above D/C summary on 11/15/24)  OUTPATIENT PHYSICAL THERAPY THORACOLUMBAR TREATMEMT   Patient Name: Kelsey Rodriguez MRN: [id number] DOB:[birth year], 33 y.o., female Today's Date: 04/15/2024  END OF SESSION:  PT End of Session - 04/15/24 1104     Visit Number 6    Number of Visits 16    Date for PT Re-Evaluation 05/04/24    Authorization Type [employer] MCR/MCD    PT Start Time 1100    PT Stop Time 1141    PT Time Calculation (min) 41 min    Activity Tolerance No increased pain;Patient limited by pain    Behavior During Therapy [facility] for tasks assessed/performed               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   Bullae 05/30/2012   Cerebral edema (HCC) 10/28/2019   Cervical dysplasia, mild 12/06/2020   Chronic back pain    Chronic kidney disease    Chronic leg pain    bilateral knees, ankles   Chronic pelvic pain in female 01/04/2019   Complex regional pain syndrome 02/03/2017   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  features (HCC)    Depression    Eczema 02/28/2022   Encephalitis, myelitis, 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   Fatigue    GERD (gastroesophageal reflux disease)    Headache    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   HIV (human immunodeficiency virus infection) (HCC) 02/2012   Hypertension    Laceration of ankle, right 11/18/2012   Lower abdominal pain 06/21/2018   Lumbar radiculopathy    Meningitis 02/18/2018   Pain of upper abdomen    Pelvic pain    PID (acute pelvic inflammatory disease) 02/26/2018   Pneumonia    Reflux esophagitis 03/11/2012   Rotator cuff strain 01/26/2020   S/P craniotomy 05/16/2020   Seizure (HCC)    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   Suicide ideation    Syphilis 02/26/2018   Tendinopathy of left shoulder 01/18/2019   TOA (tubo-ovarian abscess) 04/15/2021   Tuberculosis    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   Tubo-ovarian abscess 04/25/2021   Vertigo    Wears glasses    Past Surgical History:  Procedure Laterality Date   APPENDECTOMY  ~ 2000   APPLICATION OF CRANIAL NAVIGATION N/A 05/16/2020   Procedure: APPLICATION OF CRANIAL NAVIGATION;  Surgeon: Mavis Purchase, MD;  Location: [facility] OR;  Service: Neurosurgery;  Laterality: N/A;   CRANIOTOMY Right 05/16/2020  Procedure: Craniotomy for Resection of Lesion;  Surgeon: Mavis Purchase, MD;  Location: [facility] OR;  Service: Neurosurgery;  Laterality: Right;  right   CYSTOSCOPY WITH RETROGRADE PYELOGRAM, URETEROSCOPY AND STENT PLACEMENT Left 01/05/2023   Procedure: CYSTOSCOPY WITH BILATERAL RETROGRADE PYELOGRAM, LEFT DIAGNOSTIC URETEROSCOPY AND BLADDER BIOPSY;  Surgeon: Selma Donnice SAUNDERS, MD;  Location: [facility] ORS;  Service: Urology;  Laterality: Left;  30 MINUTES NEEDED FOR CASE  ANESTHESIA INTUBATED, PARALYZED   DILATION AND CURETTAGE OF UTERUS  12/15/2006   ESOPHAGOGASTRODUODENOSCOPY  03/11/2012   Procedure:  ESOPHAGOGASTRODUODENOSCOPY (EGD);  Surgeon: Princella CHRISTELLA Nida, MD;  Location: [facility] ENDOSCOPY;  Service: Endoscopy;  Laterality: N/A;   ESOPHAGOGASTRODUODENOSCOPY N/A 03/07/2014   Procedure: ESOPHAGOGASTRODUODENOSCOPY (EGD);  Surgeon: Lupita FORBES Commander, MD;  Location: [facility] ENDOSCOPY;  Service: Endoscopy;  Laterality: N/A;   LAPAROSCOPIC OOPHERECTOMY  05/2021   [facility] and right salpingectomy   LUNG BIOPSY  02/13/2012   Salpingectomy Left 05/2021   TOTAL HIP ARTHROPLASTY Left 12/14/2015   Procedure: LEFT TOTAL HIP ARTHROPLASTY ANTERIOR APPROACH;  Surgeon: Lonni CINDERELLA Poli, MD;  Location: [facility] ORS;  Service: Orthopedics;  Laterality: Left;   TOTAL HIP ARTHROPLASTY Right 04/04/2016   Procedure: RIGHT TOTAL HIP ARTHROPLASTY ANTERIOR APPROACH;  Surgeon: Lonni CINDERELLA Poli, MD;  Location: [facility] ORS;  Service: Orthopedics;  Laterality: Right;   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 05/09/2020   Hypertension    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HCC)    Seizure (HCC)    Toxoplasmosis 11/07/2019   AIDS (acquired immune deficiency syndrome) (HCC) 11/07/2019   Chronic pelvic pain in female 01/04/2019   Tuberculosis    Complex regional pain syndrome 02/03/2017   Headache 10/28/2016   Primary adrenal insufficiency (HCC) 01/03/2015   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  She still has pain when bending over.  She has completed a lumbar MRI but has not gotten the results yet.   EVAL- I have been having back pain about 2-3 months with a pinching pain in my low back depending on how I bend forward. I cannot stand for 10-15 min.  I always have pain when I bend over especially returning to upright  My second issue is my both my hips are THA because of Avascular necrosis and I am good with my left hip but my Right hip gives me problems. I recently on Monday March 23rd received  an injection.  I cannot work due to the pain. 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 Headaches, HIV see medical hx  PAIN:  Are you having pain? Yes: NPRS scale: at rest hip with medicine 3-4/10 but at worst  9-10/10, back at rest 0/10 but at worst 8/10 Pain location: Right back and right hip Pain description: sharp pinching pain in back and right hip Aggravating factors: standing for more than 5 min, bending over, cannot stand after 15 min, cannot sleep on Right side with increasing numbness in hip, steps and dressing getting in and out of car Relieving factors: medicine and resting  PRECAUTIONS: Other: HIV  RED FLAGS: None   WEIGHT  BEARING RESTRICTIONS: Yes WBAT bil  FALLS:  Has patient fallen in last 6 months? 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  PLOF: Independent  PATIENT GOALS:  be able to walk and stand and sleep more comfortably to complete school and work  NEXT MD VISIT: TBD  OBJECTIVE:  Note: Objective measures were completed at Evaluation unless otherwise noted.  DIAGNOSTIC FINDINGS:  Imaging: XR Lumbar Spine 2-3 Views Result Date: 02/24/2024 Lumbar spine 2 views: No acute fractures displays well-maintained.  No arthropathic changes.  Normal lordotic curvature.  No spondylolisthesis.  No acute findings.  Status post bilateral hips acetabular components are seen partially on the AP view.  There is no evidence of complicating features of the acetabular components of bilateral hips.  PATIENT SURVEYS:  Modified Oswestry 58%   COGNITION: Overall cognitive status: Within functional limits for tasks assessed     SENSATION: WFL  MUSCLE LENGTH: Hamstrings: Right 56 deg; Left 70 deg Thomas test: Right 20 degrees from horizontal  deg; Left 0 from horizontal deg  POSTURE: rounded shoulders, forward head, and weight shift left Left knee more valgus than Right PALPATION: TTP over posterior/lateral iliac crest, marked TTP over SI Right joint and elevated compared to right  LUMBAR ROM:   AROM eval  Flexion Fingertip to mid shin * especially returning to stand  Extension 30% *  Right lateral flexion 30%*  Left lateral flexion 90%  Right rotation 50%  Left rotation 90%   (Blank rows = not tested)  LOWER EXTREMITY ROM:     Active  Right  eval Left eval  Hip flexion 90 with pinching pain 105  Hip extension    Hip abduction    Hip adduction     Hip internal rotation    Hip external rotation    Knee flexion 115 132  Knee extension    Ankle dorsiflexion    Ankle plantarflexion    Ankle inversion    Ankle eversion     Key: WFL = within functional limits not formally assessed, * = concordant pain, s = stiffness/stretching sensation, NT = not tested)  LOWER EXTREMITY MMT:    MMT Right eval Left eval  Hip flexion 3- to 90 degrees only 4+  Hip extension 4- 4+  Hip abduction 3+ 4+  Hip adduction    Hip internal rotation    Hip external rotation    Knee flexion    Knee extension    Ankle dorsiflexion    Ankle plantarflexion 18/25 25/25  Ankle inversion    Ankle eversion    (Blank rows = not tested, score listed is out of 5 possible points.  N = WNL, D = diminished, C = clear for gross weakness with myotome testing, * = concordant pain with testing)  LUMBAR SPECIAL TESTS:  Straight leg raise test: Negative, Slump test: Negative, and SI Compression/distraction test: Positive Right SI  FUNCTIONAL TESTS:  5 times sit to stand: 14.49 sec 2 minute walk test: 397.2 ft  Minimum norm 579 ft 6 minute walk test: TBD  GAIT: Distance walked: 150 Assistive device utilized: None Level of assistance: Complete Independence Comments: antalgic R gait, wt shift to left   [facility] Adult PT Treatment  04/15/2024:  Therapeutic Exercise:  nu-step L8 81m while taking subjective and planning session with patient LTR - 20x S/L clam - YTB - 2x10 ea Supine hip adduction squeeze with ring - 5'' - 2x10 Small range SLR - 2x10 ea Knee ext machine - 15# - 2x10 Knee flexion machine - 25# - 2x10  Therapeutic Activity  Bridge on ball - 2x10 Standing hip abd - 2x10 ea Standing hip ext - 2x10 ea Squat w/ UE support - 2x10 Sit to stand from table - 2x10 Step up - 2x10 ea 4'' step, 2x10 lat  [facility] Adult PT Treatment:                                                DATE: 03-30-24 Aquatic therapy at [facility] [address] - therapeutic pool  temp approximately 92 degrees.Pt enters building independently. Treatment took place in water  3.8 to  4 ft 8 in. deep depending upon activity.  Pt entered and exited the pool via stair and handrails independently. Pt pain level 4/10 at the initiation of treatment  Aquatic Exercise: On submerged bench and on edge of pool holding with one UE   Walking side stepping and high stepping across pool x 4 Squats with Aquatic DB IT band stretch for Right side against pool wall Yellow noodle cat cow Heel/ toe raises BIL  Hip ext/flex with knee straight  Hip abduction/adduction  IR/ER on R and L LE with UE support Hamstring curl Bicycle kicks x1' Reverse bicycle kicks x1' Flutter kicks x 1 '  Scissor kicks x 1' Hip circles CW/CCW  pt fatiguing quickly on Right Figure 4 piriformis stretch using wall x30 BIL  LAD of Right LE to relieve  pain. Aqua stretch not needed to due to deep joint pain High marching and side stepping with rainbow DB across pool  for end of session    [facility] Adult PT Treatment  03/18/2024:  Therapeutic Exercise:  nu-step L5 31m while taking subjective and planning session with patient LTR - 20x Modified thomas stretch 1' x2 ea Supine isometric clam with cues for core contraction - 2x10 ea Seated roll out on P ball - C/L/R - 10x ea  Therapeutic Activity  Bridge on ball - 10x Standing hip abd - 2x10 ea Sit to stand from table - x10 Education: DOMS and progressive overload principles    [facility] Adult PT Treatment:                                                DATE: 03-16-24 Aquatic therapy at [facility] [address] - therapeutic pool temp approximately 92 degrees.Pt enters building independently. Treatment took place in water  3.8 to  4 ft 8 in. deep depending upon activity.  Pt entered and exited the pool via stair and handrails independently. Pt pain level 7/10 at the initiation of treatment   Sarahbeth was educated on  beneficial therapeutic effects of water  while  ambulating to acclimate to water  walking forward, backward and side stepping.  Pt educated on neutral posture and hip hinging in seated position with water  at chest level x 10 with stretch to low back and then x 10 with back at pool wall at external cue, VC for neck tucked to prevent hyperextension.   Aquatic Exercise: On submerged bench and on edge of pool holding with one UE   Walking side stepping and high stepping across pool x 4 Heel/ toe raises BIL   Hip ext/flex with knee straight  Hip abduction/adduction  Hamstring curl Hip circles CW/CCW  pt fatiguing quickly on Right Figure 4 piriformis stretch using wall x30 BIL Squats x 20 Gastroc stretch of foot on pool wall 30  x 2 on R and L CARS on R and L LE with UE support Runners stretch bottom step x30 BIL Hamstring stretch bottom step x30 BIL Aqua stretch for Right hip and gluteals  TREATMENT DATE: eval and issue HEP                                                                                                                                 PATIENT EDUCATION:  Education details: POC Explanation of findings, issue HEP Person educated: Patient Education method: Explanation, Demonstration, Tactile cues, Verbal cues, and Handouts Education comprehension: verbalized understanding, returned demonstration, verbal cues required, tactile cues required, and needs further education  HOME EXERCISE PROGRAM: Access Code: [email or web address] URL: [email or web address] Date: 03/09/2024 Prepared by: Graydon Dingwall  Exercises - Hip flexor stretch at edge of bed with SLR resisted  -  2 x daily - 7 x weekly - 1 sets - 3 reps - 30sec hold - Supine Lower Trunk Rotation  - 2 x daily - 7 x weekly - 1 sets - 5 reps - 20 sec hold  ASSESSMENT:  CLINICAL IMPRESSION:  Lujain tolerated session well with no adverse reaction.  She has met her short term goals and is showing overall improvement in LE strength and endurance.  She does still  have frequent n/t and pain into bil LE.  Waiting on MRI results.  Will continue to progress as able.  EVAL-Patient is a 33 y.o. female who was seen today for physical therapy evaluation and treatment for Right back and right hip pain with bil THA due to vascular necrosis and in past multiple medical issues.  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   OBJECTIVE IMPAIRMENTS: Abnormal gait, decreased activity tolerance, decreased mobility, decreased ROM, decreased strength, impaired flexibility, obesity, and pain.   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 Headaches, HIV see medical hx are also affecting patient's functional outcome.   REHAB POTENTIAL: Good  CLINICAL DECISION MAKING: Evolving/moderate complexity  EVALUATION COMPLEXITY: Moderate   GOALS: Goals reviewed with patient? Yes  SHORT TERM GOALS: Target date: 04-07-24  Pt will be independent with initial HEP Baseline:no knowledge Goal status: MET  2.  Pt will be able to begin walking program to build endurance and tolerance for standing/walking Baseline: unable to stand greater than 5-15 min Goal status: Ongoing  3.   Pt will demonstrate Right lateral and R extension lumbar AROM in order to demonstrate improved tolerance to functional movement patterns.  Baseline: See AROM chart Goal status:  MET  4.  Pt will be able to perform 5x STS in 13 seconds or less to show improved LE strength Baseline: eval 14.49 sec 5/2: 12'' Goal status: MET    LONG TERM GOALS: Target date: 05-04-24  Pt will be independent with advanced HEP Baseline: no knowledge Goal status: INITIAL  2.  Pt will be able to dress with 50% greater ease than on eval Baseline: Pt must dress sitting down slowly and carefully at eval Goal status: INITIAL  3.  Pt will be able to sleep with reduced pain and on Right side without awaking due to pain Baseline: sleeps only every 2 hours and needs to reposition Goal status: INITIAL  4.  Pt will improve ODI to at least 45 % to show improved functional mobility Baseline: 58% eval Goal status: INITIAL  5.  Pt will be able to stand for 30 min to complete cooking/school duties and show improvement in activity tolerance Baseline: Pt cannot stand for more than 5 min without pain Goal status: INITIAL    PLAN:  PT FREQUENCY: 1-2x/week  PT DURATION: 8 weeks  PLANNED INTERVENTIONS: 97164- PT Re-evaluation, 97110-Therapeutic exercises, 97530- Therapeutic activity, 97112- Neuromuscular re-education, 97535- Self Care, [id number]- Manual therapy, [id number]- Gait training,  02967- Electrical stimulation (manual), Patient/Family education, Stair training, Taping, Dry Needling, Joint mobilization, Spinal mobilization, Cryotherapy, and Moist heat.  PLAN FOR NEXT SESSION:  Check R SI joint,  hip flexor stretching/ manual/ TPDN  aquatics  Helene FORBES Gasmen PT 04/15/24 11:42 AM Phone: [phone] Fax: [phone]   Date of referral: 02-25-24 Referring provider: Gretta Bertrum ORN, PA-C  Referring diagnosis?  M25.551 (ICD-10-CM) - Pain in right hip  M54.41,G89.29 (ICD-10-CM) - Chronic right-sided low back pain with right-sided sciatica   Treatment diagnosis? (if different than referring diagnosis) muscle weakness, SI joint with hip and back pain  What was this (referring dx) caused  by? Other: bil THA surgeries and ongoing back pain  Nature of Condition: Chronic (continuous duration > 3 months)   Laterality: Rt  Current Functional Measure Score: Other ODI  58%  Objective measurements identify impairments when they are compared to normal values, the uninvolved extremity, and prior level of function.  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  Cannot stand more than 5-15 min without severe pain  How did symptoms start: Avascular necrosis bil /surgery bil hips and chronic back pain  Average pain intensity:  Last 24 hours: 8-10/10 without medication or injections  Past week: 8-10/10   How often does the pt experience symptoms? Constantly  How much have the symptoms interfered with usual daily activities? Quite a bit  How has condition changed since care began at this facility? NA - initial visit  In general, how is the patients overall health? Fair multiple medical issues   BACK PAIN (STarT Back Screening Tool) Has pain spread down the leg(s) at some time in the last 2 weeks? yes Has there been pain in the shoulder or neck at some time in the last 2 weeks? yes Has the pt only walked short distances because of back pain? Yes less than 5 min -15 min walking Has patient dressed more slowly because of back pain in the past 2 weeks? yes Does patient think it's not safe for a person with this condition to be physically active? Sometimes worried about falling but has not fallen in last 6 months Does patient have worrying thoughts a lot of the time? yes Does patient feel back pain is terrible and will never get any better? yes Has patient stopped enjoying things they usually enjoy? yes Overall, how bothersome has back pain been in the last 2 weeks?                    Very Much

## 2024-04-18 ENCOUNTER — Other Ambulatory Visit (HOSPITAL_COMMUNITY): Payer: Self-pay

## 2024-04-18 ENCOUNTER — Other Ambulatory Visit: Payer: Self-pay

## 2024-04-18 NOTE — Progress Notes (Signed)
 Specialty Pharmacy Refill Coordination Note  Kelsey Rodriguez is a 33 y.o. female assessed today regarding refills of clinic administered specialty medication(s) Cabotegravir  & Rilpivirine  (Cabenuva )   Clinic requested Courier to Provider Office   Delivery date: 05/02/24   Verified address: [address] [employer] [address] [place] [address]   Medication will be filled on 04/29/24.

## 2024-04-19 NOTE — Therapy (Incomplete)
 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   Bullae 05/30/2012   Cerebral edema (HCC) 10/28/2019   Cervical dysplasia, mild 12/06/2020   Chronic back pain    Chronic kidney disease    Chronic leg pain    bilateral knees, ankles   Chronic pelvic pain in female 01/04/2019   Complex regional pain syndrome 02/03/2017   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HCC)    Depression    Eczema 02/28/2022   Encephalitis, myelitis, 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   Fatigue    GERD (gastroesophageal reflux disease)    Headache    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   HIV (human immunodeficiency virus infection) (HCC) 02/2012   Hypertension    Laceration of ankle, right 11/18/2012   Lower abdominal pain 06/21/2018   Lumbar radiculopathy    Meningitis 02/18/2018   Pain of upper abdomen    Pelvic pain    PID (acute pelvic inflammatory disease) 02/26/2018   Pneumonia    Reflux esophagitis 03/11/2012   Rotator cuff strain 01/26/2020   S/P craniotomy 05/16/2020   Seizure (HCC)    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   Suicide ideation    Syphilis 02/26/2018   Tendinopathy of left shoulder 01/18/2019   TOA  (tubo-ovarian abscess) 04/15/2021   Tuberculosis    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   Tubo-ovarian abscess 04/25/2021   Vertigo    Wears glasses    Past Surgical History:  Procedure Laterality Date   APPENDECTOMY  ~ 2000   APPLICATION OF CRANIAL NAVIGATION N/A 05/16/2020   Procedure: APPLICATION OF CRANIAL NAVIGATION;  Surgeon: Garry Kansas, MD;  Location: [facility] OR;  Service: Neurosurgery;  Laterality: N/A;   CRANIOTOMY Right 05/16/2020   Procedure: Craniotomy for Resection of Lesion;  Surgeon: Garry Kansas, MD;  Location: [facility] OR;  Service: Neurosurgery;  Laterality: Right;  right   CYSTOSCOPY WITH RETROGRADE PYELOGRAM, URETEROSCOPY AND STENT PLACEMENT Left 01/05/2023   Procedure: CYSTOSCOPY WITH BILATERAL RETROGRADE PYELOGRAM, LEFT DIAGNOSTIC URETEROSCOPY AND BLADDER BIOPSY;  Surgeon: Lahoma Pigg, MD;  Location: [facility]S;  Service: Urology;  Laterality: Left;  30 MINUTES NEEDED FOR CASE  ANESTHESIA INTUBATED, PARALYZED   DILATION AND CURETTAGE OF UTERUS  12/15/2006   ESOPHAGOGASTRODUODENOSCOPY  03/11/2012   Procedure: ESOPHAGOGASTRODUODENOSCOPY (EGD);  Surgeon: Pietro Bridegroom, MD;  Location: [facility] ENDOSCOPY;  Service: Endoscopy;  Laterality: N/A;   ESOPHAGOGASTRODUODENOSCOPY N/A 03/07/2014   Procedure: ESOPHAGOGASTRODUODENOSCOPY (EGD);  Surgeon: Kenney Peacemaker, MD;  Location: [facility] ENDOSCOPY;  Service: Endoscopy;  Laterality: N/A;   LAPAROSCOPIC OOPHERECTOMY  05/2021   [facility] - LSO and right salpingectomy   LUNG BIOPSY  02/13/2012   Salpingectomy Left 05/2021   TOTAL HIP ARTHROPLASTY Left 12/14/2015  Procedure: LEFT TOTAL HIP ARTHROPLASTY ANTERIOR APPROACH;  Surgeon: Arnie Lao, MD;  Location: [facility];  Service: Orthopedics;  Laterality: Left;   TOTAL HIP ARTHROPLASTY Right 04/04/2016   Procedure: RIGHT TOTAL HIP ARTHROPLASTY ANTERIOR APPROACH;  Surgeon: Arnie Lao, MD;  Location: [facility];  Service: Orthopedics;  Laterality: Right;   Patient  Active Problem List   Diagnosis Date Noted   Routine screening for STI (sexually transmitted infection) 03/11/2024   Encounter for long-term (current) use of high-risk medication 03/11/2024   Dysplasia of cervix, low grade (CIN 1) 11/23/2023   Eczema 02/28/2022   S/P craniotomy 05/16/2020   Headache due to intracranial disease 05/09/2020   Hypertension    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HCC)    Seizure (HCC)    Toxoplasmosis 11/07/2019   AIDS (acquired immune deficiency syndrome) (HCC) 11/07/2019   Chronic pelvic pain in female 01/04/2019   Tuberculosis    Complex regional pain syndrome 02/03/2017   Headache 10/28/2016   Primary adrenal insufficiency (HCC) 01/03/2015   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  ONSET DATE: 3months December 2024  SUBJECTIVE:                                                                                                                                                                                           SUBJECTIVE STATEMENT:  04/19/2024: Pt reports that she feels that her hips are more flexible since starting PT.  She still has pain when bending over.  She has completed a lumbar MRI but has not gotten the results yet.   EVAL- I have been having back pain about 2-3 months with a pinching pain in my low back depending on how I bend forward. I cannot stand for 10-15 min.  I always have pain when I bend over especially returning to upright  My second issue is my both my hips are THA because of Avascular necrosis and I am good with my left hip but my Right hip gives me problems. I  recently on Monday March 23rd received  an injection.  I cannot work due to the pain. 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 Headaches, HIV see medical hx  PAIN:  Are you having pain? Yes: NPRS scale: at rest hip with medicine 3-4/10 but at worst  9-10/10, back at rest 0/10 but at worst 8/10 Pain location: Right back and right hip Pain description: sharp pinching pain in back and right hip Aggravating factors: standing for more than 5 min, bending over, cannot stand after 15 min, cannot sleep on Right side with increasing numbness in hip, steps and dressing getting in and out of car Relieving factors: medicine and resting  PRECAUTIONS: Other: HIV  RED FLAGS: None   WEIGHT BEARING RESTRICTIONS: Yes WBAT bil  FALLS:  Has patient fallen in last 6 months? 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  PLOF: Independent  PATIENT GOALS:  be able to walk and stand and sleep more comfortably to complete school and work  NEXT MD VISIT: TBD  OBJECTIVE:  Note: Objective measures were completed at Evaluation unless otherwise noted.  DIAGNOSTIC FINDINGS:  Imaging: XR Lumbar Spine 2-3 Views Result Date: 02/24/2024 Lumbar spine 2 views: No acute fractures displays well-maintained.  No arthropathic changes.  Normal lordotic curvature.  No spondylolisthesis.  No acute findings.  Status post bilateral hips acetabular components are seen partially on the AP view.  There is no evidence of complicating  features of the acetabular components of bilateral hips.  PATIENT SURVEYS:  Modified Oswestry 58%   COGNITION: Overall cognitive status: Within functional limits for tasks assessed     SENSATION: WFL  MUSCLE LENGTH: Hamstrings: Right 56 deg; Left 70 deg Thomas test: Right 20 degrees from horizontal  deg; Left 0 from horizontal deg  POSTURE: rounded shoulders, forward head, and weight shift left Left knee more valgus than Right PALPATION: TTP over posterior/lateral iliac crest, marked TTP over SI Right joint and elevated compared to right  LUMBAR ROM:   AROM eval  Flexion Fingertip to mid shin * especially returning to stand  Extension 30% *  Right lateral flexion 30%*  Left lateral flexion 90%  Right rotation 50%  Left rotation 90%   (Blank rows = not tested)  LOWER EXTREMITY ROM:     Active  Right eval Left eval  Hip flexion 90 with pinching pain 105  Hip extension    Hip abduction    Hip adduction    Hip internal rotation    Hip external rotation    Knee flexion 115 132  Knee extension    Ankle dorsiflexion    Ankle plantarflexion    Ankle inversion    Ankle eversion     Key: WFL = within functional limits not formally assessed, * = concordant pain, s = stiffness/stretching sensation, NT = not tested)  LOWER EXTREMITY MMT:    MMT Right eval Left eval  Hip flexion 3- to 90 degrees only 4+  Hip extension 4- 4+  Hip abduction 3+ 4+  Hip adduction    Hip internal rotation    Hip external rotation    Knee flexion    Knee extension    Ankle dorsiflexion    Ankle plantarflexion 18/25 25/25  Ankle  inversion    Ankle eversion    (Blank rows = not tested, score listed is out of 5 possible points.  N = WNL, D = diminished, C = clear for gross weakness with myotome testing, * = concordant pain with testing)  LUMBAR SPECIAL TESTS:  Straight leg raise test: Negative, Slump test: Negative, and SI Compression/distraction test: Positive Right SI  FUNCTIONAL  TESTS:  5 times sit to stand: 14.49 sec 2 minute walk test: 397.2 ft  Minimum norm 579 ft 6 minute walk test: TBD  GAIT: Distance walked: 150 Assistive device utilized: None Level of assistance: Complete Independence Comments: antalgic R gait, wt shift to left  [facility] Adult PT Treatment:                                                DATE: *** HEP laminated prepared Access Code: [facility] URL: [email or web address]  Aquatic therapy at [facility] [address] - therapeutic pool temp approximately 92 degrees.Pt enters building independently. Treatment took place in water  3.8 to  4 ft 8 in. deep depending upon activity.  Pt entered and exited the pool via stair and handrails independently. Aquatic Exercise: Cat Cow in Shallow Water  with Pool Noodle   Lunge to Target at [facility] Stretch with Foot at [employer]   Backward Walking   Bilateral Shoulder Horizontal Abduction Adduction AROM  Standing March at [facility] Standing Hip Flexion Extension at [facility]   Standing Hip Abduction Adduction at [employer]  - Squat  -  Single Leg Squat  - Squat with single Hand Float   Seated Noodle Bicycle Legs   3 -way Leg Stretch with Pool Noodle  Walking March  Standing 3-Way Kick    Pt requires the buoyancy of water  for active assisted exercises with buoyancy supported for strengthening and AROM exercises. Hydrostatic pressure also supports joints by unweighting joint load by at least 50 % in 3-4 feet depth water . 80% in chest to neck deep water . Water  will provide assistance with movement using the current and laminar flow while the buoyancy reduces weight bearing. Pt requires the viscosity of the water  for resistance with strengthening exercises.   [facility] Adult PT Treatment  04/15/2024:  Therapeutic Exercise:  nu-step L8 50m while taking subjective and planning session with patient LTR - 20x S/L clam - YTB - 2x10 ea Supine hip adduction squeeze with ring  - 5'' - 2x10 Small range SLR - 2x10 ea Knee ext machine - 15# - 2x10 Knee flexion machine - 25# - 2x10  Therapeutic Activity  Bridge on ball - 2x10 Standing hip abd - 2x10 ea Standing hip ext - 2x10 ea Squat w/ UE support - 2x10 Sit to stand from table - 2x10 Step up - 2x10 ea 4'' step, 2x10 lat  [facility] Adult PT Treatment:                                                DATE: 03-30-24 Aquatic therapy at [facility] [address] - therapeutic pool temp approximately 92 degrees.Pt enters building independently. Treatment took place in water  3.8 to  4 ft 8 in. deep depending upon activity.  Pt entered and exited  the pool via stair and handrails independently. Pt pain level 4/10 at the initiation of treatment  Aquatic Exercise: On submerged bench and on edge of pool holding with one UE   Walking side stepping and high stepping across pool x 4 Squats with Aquatic DB IT band stretch for Right side against pool wall Yellow noodle cat cow Heel/ toe raises BIL  Hip ext/flex with knee straight  Hip abduction/adduction  IR/ER on R and L LE with UE support Hamstring curl Bicycle kicks x1' Reverse bicycle kicks x1' Flutter kicks x 1 '  Scissor kicks x 1' Hip circles CW/CCW  pt fatiguing quickly on Right Figure 4 piriformis stretch using wall x30" BIL  LAD of Right LE to relieve pain. Aqua stretch not needed to due to deep joint pain High marching and side stepping with rainbow DB across pool  for end of session    [facility] Adult PT Treatment  03/18/2024:  Therapeutic Exercise:  nu-step L5 66m while taking subjective and planning session with patient LTR - 20x Modified thomas stretch 1' x2 ea Supine isometric clam with cues for core contraction - 2x10 ea Seated roll out on P ball - C/L/R - 10x ea  Therapeutic Activity  Bridge on ball - 10x Standing hip abd - 2x10 ea Sit to stand from table - x10 Education: DOMS and progressive overload principles    [facility] Adult PT Treatment:                                                 DATE: 03-16-24 Aquatic therapy at [facility] [address] - therapeutic pool temp approximately 92 degrees.Pt enters building independently. Treatment took place in water  3.8 to  4 ft 8 in. deep depending upon activity.  Pt entered and exited the pool via stair and handrails independently. Pt pain level 7/10 at the initiation of treatment   Bina was educated on  beneficial therapeutic effects of water  while ambulating to acclimate to water  walking forward, backward and side stepping.  Pt educated on neutral posture and hip hinging in seated position with water  at chest level x 10 with stretch to low back and then x 10 with back at pool wall at external cue, VC for neck tucked to prevent hyperextension.   Aquatic Exercise: On submerged bench and on edge of pool holding with one UE   Walking side stepping and high stepping across pool x 4 Heel/ toe raises BIL   Hip ext/flex with knee straight  Hip abduction/adduction  Hamstring curl Hip circles CW/CCW  pt fatiguing quickly on Right Figure 4 piriformis stretch using wall x30" BIL Squats x 20 Gastroc stretch of foot on pool wall 30 " x 2 on R and L CARS on R and L LE with UE support Runners stretch bottom step x30" BIL Hamstring stretch bottom step x30" BIL Aqua stretch for Right hip and gluteals  TREATMENT DATE: eval and issue HEP  PATIENT EDUCATION:  Education details: POC Explanation of findings, issue HEP Person educated: Patient Education method: Explanation, Demonstration, Tactile cues, Verbal cues, and Handouts Education comprehension: verbalized understanding, returned demonstration, verbal cues required, tactile cues required, and needs further education  HOME EXERCISE PROGRAM: Access Code: [email or web address] URL: [email or web address] Date:  03/09/2024 Prepared by: Sharlet Dawson  Exercises - Hip flexor stretch at edge of bed with SLR resisted  - 2 x daily - 7 x weekly - 1 sets - 3 reps - 30sec hold - Supine Lower Trunk Rotation  - 2 x daily - 7 x weekly - 1 sets - 5 reps - 20 sec hold Aquatic Access Code: [facility] URL: [email or web address]  ASSESSMENT:  CLINICAL IMPRESSION:  Kelsey Rodriguez tolerated session well with no adverse reaction.  She has met her short term goals and is showing overall improvement in LE strength and endurance.  She does still have frequent n/t and pain into bil LE.  Waiting on MRI results.  Will continue to progress as able.  EVAL-Patient is a 33 y.o. female who was seen today for physical therapy evaluation and treatment for Right back and right hip pain with bil THA due to vascular necrosis and in past multiple medical issues.  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   OBJECTIVE IMPAIRMENTS: Abnormal gait, decreased activity tolerance, decreased mobility, decreased ROM, decreased strength, impaired flexibility, obesity, and pain.   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 Headaches, HIV see medical hx are also affecting patient's functional outcome.   REHAB POTENTIAL: Good  CLINICAL DECISION MAKING: Evolving/moderate complexity  EVALUATION COMPLEXITY:  Moderate   GOALS: Goals reviewed with patient? Yes  SHORT TERM GOALS: Target date: 04-07-24  Pt will be independent with initial HEP Baseline:no knowledge Goal status: MET  2.  Pt will be able to begin walking program to build endurance and tolerance for standing/walking Baseline: unable to stand greater than 5-15 min Goal status: Ongoing  3.   Pt will demonstrate Right lateral and R extension lumbar AROM in order to demonstrate improved tolerance to functional movement patterns.  Baseline: See AROM chart Goal status: MET  4.  Pt will be able to perform 5x STS in 13 seconds or less to show improved LE strength Baseline: eval 14.49 sec 5/2: 12'' Goal status: MET    LONG TERM GOALS: Target date: 05-04-24  Pt will be independent with advanced HEP Baseline: no knowledge Goal status: INITIAL  2.  Pt will be able to dress with 50% greater ease than on eval Baseline: Pt must dress sitting down slowly and carefully at eval Goal status: INITIAL  3.  Pt will be able to sleep with reduced pain and on Right side without awaking due to pain Baseline: sleeps only every 2 hours and needs to reposition Goal status: INITIAL  4.  Pt will improve ODI to at least 45 % to show improved functional mobility Baseline: 58%  eval Goal status: INITIAL  5.  Pt will be able to stand for 30 min to complete cooking/school duties and show improvement in activity tolerance Baseline: Pt cannot stand for more than 5 min without pain Goal status: INITIAL    PLAN:  PT FREQUENCY: 1-2x/week  PT DURATION: 8 weeks  PLANNED INTERVENTIONS: 97164- PT Re-evaluation, 97110-Therapeutic exercises, 97530- Therapeutic activity, 97112- Neuromuscular re-education, 97535- Self Care, 14782- Manual therapy, [id number]- Gait training, [phone]- Electrical stimulation (manual), Patient/Family education, Stair training, Taping, Dry Needling, Joint mobilization, Spinal mobilization, Cryotherapy, and Moist heat.  PLAN FOR NEXT  SESSION:  Check R SI joint,  hip flexor stretching/ manual/ TPDN  aquatics  ***  Date of referral: 02-25-24 Referring provider: Bronson Canny, PA-C  Referring diagnosis?  M25.551 (ICD-10-CM) - Pain in right hip  M54.41,G89.29 (ICD-10-CM) - Chronic right-sided low back pain with right-sided sciatica   Treatment diagnosis? (if different than referring diagnosis) muscle weakness, SI joint with hip and back pain  What was this (referring dx) caused by? Other: bil THA surgeries and ongoing back pain  Nature of Condition: Chronic (continuous duration > 3 months)   Laterality: Rt  Current Functional Measure Score: Other ODI  58%  Objective measurements identify impairments when they are compared to normal values, the uninvolved extremity, and prior level of function.  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  Cannot stand more than 5-15 min without severe pain  How did symptoms start: Avascular necrosis bil /surgery bil hips and chronic back pain  Average pain intensity:  Last 24 hours: 8-10/10 without medication or injections  Past week: 8-10/10   How often does the pt experience symptoms? Constantly  How much have the symptoms interfered with usual daily activities? Quite a bit  How has condition changed since care began at this facility? NA - initial visit  In general, how is the patients overall health? Fair multiple medical issues   BACK PAIN (STarT Back Screening Tool) Has pain spread down the leg(s) at some time in the last 2 weeks? yes Has there been pain in the shoulder or neck at some time in the last 2 weeks? yes Has the pt only walked short distances because of back pain? Yes less than 5 min -15 min walking Has patient dressed more slowly because of back pain in the past 2  weeks? yes Does patient think it's not safe for a person with this condition to be physically active? Sometimes worried about falling but has not fallen in last 6 months Does patient have worrying thoughts a lot of the time? yes Does patient feel back pain is terrible and will never get any better? yes Has patient stopped enjoying things they usually enjoy? yes Overall, how bothersome has back pain been in the last 2 weeks?                    Very Much

## 2024-04-20 ENCOUNTER — Ambulatory Visit: Admitting: Physical Therapy

## 2024-04-21 ENCOUNTER — Ambulatory Visit: Admitting: Physician Assistant

## 2024-04-21 ENCOUNTER — Encounter: Payer: Self-pay | Admitting: Physician Assistant

## 2024-04-21 DIAGNOSIS — M5441 Lumbago with sciatica, right side: Secondary | ICD-10-CM

## 2024-04-21 DIAGNOSIS — G8929 Other chronic pain: Secondary | ICD-10-CM

## 2024-04-21 DIAGNOSIS — M5442 Lumbago with sciatica, left side: Secondary | ICD-10-CM

## 2024-04-21 DIAGNOSIS — M25551 Pain in right hip: Secondary | ICD-10-CM

## 2024-04-21 NOTE — Progress Notes (Signed)
 HPI: Kelsey Rodriguez returns to go over the MRI of her lumbar spine.  She continues to have pain in the low back.  Describes as a pinching like sensation.  Seems somewhat when she bends over causes her scream out.  She also now is describing bilateral numbness in the lower extremities that goes to her feet.  Prior notes stated it was mostly on the right than it was in the thigh.  She has constant right groin pain despite right hip injection and a history of bilateral total hip arthroplasties.  MRI of her hip was unremarkable.  MRI lumbar spine was reviewed with the patient.  Shows no foraminal or spinal stenosis from T12-L5.  Mild broad disc space bulge at L5-S1 but otherwise no foraminal or spinal stenosis.  Mild facet arthritis at L5-S1.  Impression: Low back pain with radicular symptoms unknown etiology  Plan: Given her negative MRI of the right hip and relatively normal lumbar spine MRI recommend referral to pain management.  She states that physical therapy makes her back pain worse and therefore recommend she stop.  Questions were encouraged and answered.  Follow-up as needed.

## 2024-04-22 ENCOUNTER — Ambulatory Visit: Admitting: Physical Therapy

## 2024-04-22 ENCOUNTER — Telehealth: Payer: Self-pay | Admitting: Radiology

## 2024-04-22 DIAGNOSIS — G8929 Other chronic pain: Secondary | ICD-10-CM

## 2024-04-22 NOTE — Telephone Encounter (Signed)
 Kelsey Rodriguez

## 2024-05-02 ENCOUNTER — Telehealth: Payer: Self-pay

## 2024-05-02 ENCOUNTER — Ambulatory Visit (INDEPENDENT_AMBULATORY_CARE_PROVIDER_SITE_OTHER)

## 2024-05-02 ENCOUNTER — Ambulatory Visit (HOSPITAL_COMMUNITY)
Admission: EM | Admit: 2024-05-02 | Discharge: 2024-05-02 | Disposition: A | Discharge status: Home / Self Care | Attending: Family Medicine | Admitting: Family Medicine

## 2024-05-02 ENCOUNTER — Encounter (HOSPITAL_COMMUNITY): Payer: Self-pay

## 2024-05-02 DIAGNOSIS — M7751 Other enthesopathy of right foot: Secondary | ICD-10-CM

## 2024-05-02 DIAGNOSIS — M79671 Pain in right foot: Secondary | ICD-10-CM

## 2024-05-02 MED ORDER — MELOXICAM 15 MG PO TABS: ORAL_TABLET | ORAL | 0 refills | Status: AC

## 2024-05-02 NOTE — Discharge Instructions (Addendum)
 Please take your meloxicam  1 pill a day for the next 14 days.  Please do not wear heels.  I would recommend wearing compression socks on the right foot.  Please wear your post op shoe for the next 2-3 weeks

## 2024-05-02 NOTE — Telephone Encounter (Signed)
 [facility] Patient Advocate Encounter  Patient's medications [place]  have been couriered to [facility] from [facility] and will be administered at the patients appointment on 05/04/24.  Roylene Corn, CPhT Specialty Pharmacy Patient [facility] Phone: [phone] Fax:  [phone]

## 2024-05-02 NOTE — ED Triage Notes (Signed)
 Pt c/o bilateral feet swelling since Friday after standing a long time for graduation. States swelling worse in rt foot with pain.

## 2024-05-02 NOTE — ED Provider Notes (Signed)
 [facility]    CSN: [id number] Arrival date & time: 05/02/24  1003      History   Chief Complaint Chief Complaint  Patient presents with   Foot Swelling    HPI Kelsey Rodriguez is a 33 y.o. female.   Patient with no known injury presenting for pain of the right foot.  Patient states that she was at her graduation on Friday and was wearing heels and standing for long period of time.  Patient notes that her foot was swollen at the end of the day and that the swelling did not go away.  Patient notes pain whenever she ambulates today.  Patient does not remember any inciting event or has no known injury.  Patient has never rolled her ankle in the past.  Patient states that she usually wears heels without any difficulty.  Patient notes that the pain is worse with ambulation and is better at rest.  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  Patient states that they had never told her what the cause was.  Patient does have a history of HIV and is on medication.  Chart review does show that she has a history of adrenal insufficiency.     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   Bullae 05/30/2012   Cerebral edema (HCC) 10/28/2019   Cervical dysplasia, mild 12/06/2020   Chronic back pain    Chronic kidney disease    Chronic leg pain    bilateral knees, ankles   Chronic pelvic pain in female 01/04/2019   Complex  regional pain syndrome 02/03/2017   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HCC)    Depression    Eczema 02/28/2022   Encephalitis, myelitis, 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   Fatigue    GERD (gastroesophageal reflux disease)    Headache    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   HIV (human immunodeficiency virus infection) (HCC) 02/2012   Hypertension    Laceration of ankle, right 11/18/2012   Lower abdominal pain 06/21/2018   Lumbar radiculopathy    Meningitis 02/18/2018   Pain of upper abdomen    Pelvic pain    PID (acute pelvic inflammatory disease) 02/26/2018   Pneumonia    Reflux esophagitis 03/11/2012   Rotator cuff strain 01/26/2020   S/P craniotomy 05/16/2020   Seizure (HCC)    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   Suicide ideation    Syphilis 02/26/2018   Tendinopathy of left shoulder 01/18/2019   TOA (tubo-ovarian abscess) 04/15/2021   Tuberculosis    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   Tubo-ovarian abscess 04/25/2021   Vertigo    Wears glasses     Patient Active Problem List   Diagnosis Date Noted   Routine screening for STI (sexually transmitted infection) 03/11/2024   Encounter for long-term (current)  use of high-risk medication 03/11/2024   Dysplasia of cervix, low grade (CIN 1) 11/23/2023   Eczema 02/28/2022   S/P craniotomy 05/16/2020   Headache due to intracranial disease 05/09/2020   Hypertension    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HCC)    Seizure (HCC)    Toxoplasmosis 11/07/2019   AIDS (acquired immune deficiency syndrome) (HCC) 11/07/2019   Chronic pelvic pain in female 01/04/2019   Tuberculosis    Complex regional pain syndrome 02/03/2017   Headache 10/28/2016   Primary adrenal insufficiency (HCC) 01/03/2015   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     Past Surgical History:  Procedure Laterality Date   APPENDECTOMY  ~ 2000   APPLICATION OF CRANIAL NAVIGATION N/A 05/16/2020   Procedure: APPLICATION OF CRANIAL NAVIGATION;  Surgeon: Garry Kansas, MD;  Location: [facility] OR;  Service: Neurosurgery;  Laterality: N/A;   CRANIOTOMY Right 05/16/2020   Procedure: Craniotomy for Resection of Lesion;  Surgeon: Garry Kansas, MD;  Location[facility] OR;  Service: Neurosurgery;  Laterality: Right;  right   CYSTOSCOPY WITH RETROGRADE PYELOGRAM, URETEROSCOPY AND STENT PLACEMENT Left 01/05/2023   Procedure: CYSTOSCOPY WITH BILATERAL RETROGRADE PYELOGRAM, LEFT DIAGNOSTIC URETEROSCOPY AND BLADDER BIOPSY;  Surgeon: Lahoma Pigg, MD;  Location: [facility] ORS;  Service: Urology;  Laterality: Left;  30 MINUTES NEEDED FOR CASE  ANESTHESIA INTUBATED, PARALYZED   DILATION AND CURETTAGE OF UTERUS  12/15/2006   ESOPHAGOGASTRODUODENOSCOPY  03/11/2012   Procedure: ESOPHAGOGASTRODUODENOSCOPY (EGD);  Surgeon: Pietro Bridegroom, MD;  Location: [facility] ENDOSCOPY;  Service: Endoscopy;  Laterality: N/A;   ESOPHAGOGASTRODUODENOSCOPY N/A 03/07/2014   Procedure: ESOPHAGOGASTRODUODENOSCOPY (EGD);  Surgeon: Kenney Peacemaker, MD;  Location: [facility] ENDOSCOPY;  Service: Endoscopy;  Laterality: N/A;   LAPAROSCOPIC OOPHERECTOMY  05/2021   [facility] - LSO and right salpingectomy   LUNG BIOPSY  02/13/2012   Salpingectomy Left 05/2021   TOTAL HIP ARTHROPLASTY Left 12/14/2015   Procedure: LEFT TOTAL HIP ARTHROPLASTY ANTERIOR APPROACH;  Surgeon: Arnie Lao, MD;  Location: [facility] ORS;  Service: Orthopedics;  Laterality: Left;   TOTAL HIP ARTHROPLASTY Right 04/04/2016   Procedure: RIGHT TOTAL HIP ARTHROPLASTY ANTERIOR APPROACH;  Surgeon: Arnie Lao, MD;  Location: [facility] ORS;  Service: Orthopedics;  Laterality: Right;    OB History     Gravida  1   Para  0   Term  0   Preterm  0   AB  1   Living  0      SAB  1   IAB  0   Ectopic  0   Multiple  0   Live Births                Home Medications    Prior to Admission medications   Medication Sig Start Date End Date Taking? Authorizing Provider  meloxicam  (MOBIC ) 15 MG tablet Take 1 tablet daily with food for 7 days. Then take as needed. 05/02/24  Yes Jahan Friedlander, MD  amitriptyline  (ELAVIL ) 10 MG tablet Take 1 tablet (10 mg total) by mouth at bedtime as needed for sleep (headaches). 11/05/22 01/05/23  Kommor, Madison, MD  amitriptyline  (ELAVIL ) 25 MG tablet Take 1 tablet (25 mg total) by mouth at bedtime. 03/02/23   Liane Redman, MD  cabotegravir  & rilpivirine  ER (CABENUVA ) 400 & 600 MG/2ML injection Inject 1 kit into the muscle every 30 (thirty) days. 11/10/23   Sonya Duster, RPH-CPP  clotrimazole -betamethasone  (LOTRISONE ) cream Apply topically 2 (two) times daily as needed. 09/16/23  [provider]  diphenhydrAMINE  (BENADRYL ) 25 MG tablet Take 25 mg by mouth every 6 (six) hours as needed (when taking oxycodone ).    [provider]  docusate sodium  (COLACE) 100 MG capsule Take 1 capsule (100 mg total) by mouth daily as needed for up to 30 doses. 01/05/23   Lahoma Pigg, MD  Ensure (ENSURE) Take 1 Can by mouth 2 (two) times daily between meals. 01/08/24 01/07/25  Calone, Gregory D, FNP  hydroxypropyl methylcellulose / hypromellose (ISOPTO TEARS / GONIOVISC) 2.5 % ophthalmic solution Place 1 drop into both eyes as needed for dry eyes.    [provider]  ibuprofen  (ADVIL ) 800 MG tablet Take 800 mg by mouth 2 (two) times daily as needed for moderate pain. 05/05/22   [provider]  Polyethylene Glycol 3350  (MIRALAX  PO) Take 17 g by mouth daily.    [provider]  tranexamic acid  (LYSTEDA ) 650 MG TABS tablet Take 2 tablets (1,300 mg total) by mouth 3 (three) times daily. Take during menses for a maximum of five days 10/13/22   Lacey Pian, MD  triamcinolone  cream (KENALOG ) 0.1 % SMARTSIG:1 Application Topical 2-3 Times Daily 01/11/24   [provider]    Family  History Family History  Problem Relation Age of Onset   Heart disease Father        Vague not clearly cardiac   Hypertension Mother     Social History Social History   Tobacco Use   Smoking status: Never   Smokeless tobacco: Never  Vaping Use   Vaping status: Never Used  Substance Use Topics   Alcohol use: Not Currently    Comment: socially   Drug use: No     Allergies   Hydrocodone  and Tramadol    Review of Systems Review of Systems   Physical Exam Triage Vital Signs ED Triage Vitals  Encounter Vitals Group     BP 05/02/24 1043 (!) 132/91     Systolic BP Percentile --      Diastolic BP Percentile --      Pulse Rate 05/02/24 1043 94     Resp 05/02/24 1043 18     Temp 05/02/24 1043 98.2 F (36.8 C)     Temp Source 05/02/24 1043 Oral     SpO2 05/02/24 1043 98 %     Weight --      Height --      Head Circumference --      Peak Flow --      Pain Score 05/02/24 1044 10     Pain Loc --      Pain Education --      Exclude from Growth Chart --    No data found.  Updated Vital Signs BP (!) 132/91 (BP Location: Left Arm)   Pulse 94   Temp 98.2 F (36.8 C) (Oral)   Resp 18   SpO2 98%   Visual Acuity Right Eye Distance:   Left Eye Distance:   Bilateral Distance:    Right Eye Near:   Left Eye Near:    Bilateral Near:     Physical Exam Inspection of the right foot shows significant swelling compared to the right, there is tenderness to palpation throughout the foot but more so located at the ATFL as well as the talus region.  Range of motion is decreased due to pain with dorsiflexion and plantar dorsiflexion.  Anterior tilt is normal.  Inversion and eversion is decreased due to pain.  UC Treatments /  Results  Labs (all labs ordered are listed, but only abnormal results are displayed) Labs Reviewed - No data to display  EKG   Radiology No results found.  Procedures Procedures (including critical care time)  Medications Ordered in  UC Medications - No data to display  Initial Impression / Assessment and Plan / UC Course  I have reviewed the triage vital signs and the nursing notes.  Pertinent labs & imaging results that were available during my care of the patient were reviewed by me and considered in my medical decision making (see chart for details).     Patient with no known injury presenting with sudden onset right ankle pain.  Patient is never had symptoms like this in the past.  Patient's x-rays do not show any significant sign of fracture but there is a noted large dorsal talus osteophyte.  Given that patient was likely in plantarflexion for a much longer period of time than she is used to expect that this could be the inciting injury.  No other signs of fractures on x-ray.  Patient does have a unique history of having bilateral avascular necrosis of the hips in her late 56s.  We will go ahead and do meloxicam  today for anti-inflammatory benefit.  I advised patient to continue wearing a compression sleeve on the right foot and in the meantime we will put her in a postop shoe/small boot in order for her to take pressure off of that dorsal compartment.  I do not suspect this to be instability issue of the ATFL.  Also go ahead and refer patient to orthopedic foot surgeon at this time for further evaluation  Final Clinical Impressions(s) / UC Diagnoses   Final diagnoses:  Bone spur of left foot   Discharge Instructions      Please take your meloxicam  1 pill a day for the next 14 days.  Please do not wear heels.  I would recommend wearing compression socks on the right foot.  ED Prescriptions     Medication Sig Dispense Auth. Provider   meloxicam  (MOBIC ) 15 MG tablet Take 1 tablet daily with food for 7 days. Then take as needed. 14 tablet Jesslyn Viglione, MD      PDMP not reviewed this encounter.   Jude Norton, MD 05/02/24 [phone]

## 2024-05-03 ENCOUNTER — Ambulatory Visit (HOSPITAL_COMMUNITY): Payer: Self-pay

## 2024-05-03 ENCOUNTER — Encounter: Payer: Self-pay | Admitting: Orthopedic Surgery

## 2024-05-03 ENCOUNTER — Ambulatory Visit (INDEPENDENT_AMBULATORY_CARE_PROVIDER_SITE_OTHER): Admitting: Orthopedic Surgery

## 2024-05-03 DIAGNOSIS — M79671 Pain in right foot: Secondary | ICD-10-CM | POA: Diagnosis not present

## 2024-05-03 NOTE — Progress Notes (Signed)
 HPI: Kelsey Rodriguez is a 33 y.o. female who presents to the [facility] pharmacy clinic for Cabenuva  administration.  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   AMITRIPTYLINE  (ELAVIL ) 25 MG TABLET    Take 1 tablet (25 mg total) by mouth at bedtime.   CABOTEGRAVIR  & RILPIVIRINE  ER (CABENUVA ) 400 & 600 MG/2ML INJECTION    Inject 1 kit into the muscle every 30 (thirty) days.   CLOTRIMAZOLE -BETAMETHASONE  (LOTRISONE ) CREAM    Apply topically 2 (two) times daily as needed.   DIPHENHYDRAMINE  (BENADRYL ) 25 MG TABLET    Take 25 mg by mouth every 6 (six) hours as needed (when taking oxycodone ).   DOCUSATE SODIUM  (COLACE) 100 MG CAPSULE    Take 1 capsule (100 mg total) by mouth daily as needed for up to 30 doses.   ENSURE (ENSURE)    Take 1 Can by mouth 2 (two) times daily between meals.   HYDROXYPROPYL METHYLCELLULOSE / HYPROMELLOSE (ISOPTO TEARS / GONIOVISC) 2.5 % OPHTHALMIC SOLUTION    Place 1 drop into both eyes as needed for dry eyes.   IBUPROFEN   (ADVIL ) 800 MG TABLET    Take 800 mg by mouth 2 (two) times daily as needed for moderate pain.   MELOXICAM  (MOBIC ) 15 MG TABLET    Take 1 tablet daily with food for 7 days. Then take as needed.   POLYETHYLENE GLYCOL 3350  (MIRALAX  PO)    Take 17 g by mouth daily.   TRANEXAMIC ACID  (LYSTEDA ) 650 MG TABS TABLET    Take 2 tablets (1,300 mg total) by mouth 3 (three) times daily. Take during menses for a maximum of five days   TRIAMCINOLONE  CREAM (KENALOG ) 0.1 %    SMARTSIG:1 Application Topical 2-3 Times Daily  Modified Medications   No medications on file  Discontinued Medications   No medications on file    Allergies: Allergies  Allergen Reactions   Hydrocodone  Itching and Nausea Only    Pt states she can tolerate with benadryl  and Tolerates Oxycodone    Tramadol  Itching and Nausea Only    Pt states she can tolerate with benadryl  and Tolerates oxycodone     Labs: Lab Results  Component Value Date   HIV1RNAQUANT 53 (H) 04/06/2024   HIV1RNAQUANT NOT DETECTED 03/11/2024   HIV1RNAQUANT 86 (H) 02/17/2024   CD4TABS <35 (L) 09/28/2023   CD4TABS 126 (L) 03/02/2023   CD4TABS 115 (L) 11/27/2022    RPR and STI Lab Results  Component Value Date   LABRPR REACTIVE (A) 03/11/2024   LABRPR REACTIVE (A)  09/28/2023   LABRPR REACTIVE (A) 11/27/2022   LABRPR REACTIVE (A) 08/12/2022   LABRPR Reactive (A) 04/12/2022   RPRTITER 1:1 (H) 03/11/2024   RPRTITER 1:1 (H) 09/28/2023   RPRTITER 1:1 (H) 11/27/2022   RPRTITER 1:1 (H) 08/12/2022   RPRTITER 1:1 (H) 02/28/2022    STI Results GC CT  09/28/2023 10:51 AM Negative  Negative   03/18/2022 11:17 AM Negative  Negative   01/02/2022  3:50 PM Negative  Negative   04/14/2021 10:35 PM Negative  Negative   11/01/2020  3:18 PM Negative  Negative   08/09/2018 12:00 AM Negative  Negative   02/18/2018 12:00 AM Negative  Negative   02/12/2018 12:00 AM Negative  **POSITIVE**   11/20/2017 12:00 AM Negative  Negative   05/23/2016 12:00 AM Negative  Negative    01/23/2015 12:00 AM NG: Negative  CT: Negative     Hepatitis B Lab Results  Component Value Date   HEPBSAB POS (A) 09/13/2015   HEPBSAG NEGATIVE 03/13/2012   HEPBCAB NEGATIVE 03/13/2012   Hepatitis C Lab Results  Component Value Date   HEPCAB NON-REACTIVE 03/17/2022   Hepatitis A Lab Results  Component Value Date   HAV POSITIVE (A) 03/13/2012   Lipids: Lab Results  Component Value Date   CHOL 228 (H) 03/11/2024   TRIG 112 03/11/2024   HDL 72 03/11/2024   CHOLHDL 3.2 03/11/2024   VLDL 29 12/25/2016   LDLCALC 134 (H) 03/11/2024    TARGET DATE: 26th of the month  Assessment: Kimiyah presents today for her 1 month maintenance Cabenuva  injections. Past injections were tolerated well without issues. Last HIV RNA was 53 in April. Doing well with no issues today.  Administered cabotegravir  400mg /68mL in right upper outer quadrant of the gluteal muscle. Administered rilpivirine  600 mg/2mL in the left upper outer quadrant of the gluteal muscle. No issues with injections. Citlalli will follow up in 1 month for next set of injections.  Discussed vaccines today. Rubi is eligible for the shingles vaccine today. Agreeable to the first dose. She politely declines STI testing today.   Libbey asked about transitioning to every 2 month injections. Explained that we are waiting until her viral load is consistently undetectable before transitioning. This may be possible later this year. She is still taking Bactrim  for PJP prophylaxis. Needs a refill, will send in today. This can likely be stopped by the provider when she is seen in July.   Plan: - Cabenuva  injections administered - HIV RNA today - Shingles vaccine administered - Order Bactrim  for PJP ppx - Next injections scheduled for 6/25 with Mylinda Asa, 7/28 with Dr. Levern Reader - Call with any issues or questions   Valarie Garner, PharmD PGY1 Pharmacy Resident

## 2024-05-03 NOTE — Progress Notes (Signed)
 Office Visit Note   Patient: Kelsey Rodriguez           Date of Birth: 07/25/1991           MRN: [id number] Visit Date: 05/03/2024              Requested by: Jude Norton, MD [address] [place],  [place] [address] PCP: [facility]  Chief Complaint  Patient presents with   Right Ankle - Pain   Right Foot - Pain      HPI: Patient is a 33 year old woman who presents with pain and swelling of the right foot.  Patient states she has had no recent trauma.  She states the pain is on the dorsum of the midfoot.  Patient states she has pain after standing 1 hour.  Patient was seen in urgent care yesterday she was placed in a postoperative shoe and started Mobic .  Assessment & Plan: Visit Diagnoses:  1. Pain in right foot     Plan: Continue with the postoperative shoe and Mobic .  Will repeat three-view radiographs of the right foot at follow-up.  Follow-Up Instructions: Return in about 4 weeks (around 05/31/2024).   Ortho Exam  Patient is alert, oriented, no adenopathy, well-dressed, normal affect, normal respiratory effort. Examination patient has pitting edema of the right foot.  She has pain to palpation beneath the metatarsal heads pain to palpation across the midfoot and pain to palpation across the ankle.  She has ankle dorsiflexion to neutral she has good ankle and subtalar motion.  Patient does have talar beaking at the talonavicular joint however does have good subtalar motion.  She does have pes planus.  Examination of the skin color and temperature it is normal compared to the opposite foot.  No hypersensitivity to light touch.  Patient does have pain with start up.  I do not  Imaging: DG Ankle 2 Views Right Result Date: 05/02/2024 CLINICAL DATA:  Right foot swelling and pain after standing for extended time over the weekend EXAM: RIGHT ANKLE - 2 VIEW COMPARISON:  05/02/2024 FINDINGS: Diffuse lower extremity, ankle and foot subcutaneous edema noted. No acute osseous  finding, fracture, malalignment, or joint abnormality. No arthropathy. IMPRESSION: Diffuse subcutaneous edema. No acute osseous finding. Electronically Signed   By: Melven Stable.  Shick M.D.   On: 05/02/2024 13:20   DG Foot Complete Right Result Date: 05/02/2024 CLINICAL DATA:  pain swelling. EXAM: RIGHT FOOT COMPLETE - 3+ VIEW COMPARISON:  None Available. FINDINGS: No acute fracture or dislocation. No aggressive osseous lesion. No significant arthritis of imaged joints. Mild-to-moderate diffuse soft tissue swelling over the dorsum of the foot noted. No radiopaque foreign bodies. IMPRESSION: *No acute osseous abnormality of the right foot. Electronically Signed   By: Beula Brunswick M.D.   On: 05/02/2024 11:45   No images are attached to the encounter.  Labs: Lab Results  Component Value Date   HGBA1C 5.1 04/20/2015   ESRSEDRATE 60 (H) 03/07/2020   ESRSEDRATE 34 (H) 10/24/2014   ESRSEDRATE 47 (H) 10/05/2014   CRP 9.5 (H) 03/07/2020   CRP <0.5 10/24/2014   CRP <0.5 10/05/2014   LABURIC 7.9 (H) 05/20/2012   LABURIC 2.8 03/11/2012   REPTSTATUS 11/09/2022 FINAL 11/05/2022   GRAMSTAIN  11/05/2022    WBC PRESENT, PREDOMINANTLY MONONUCLEAR NO ORGANISMS SEEN CYTOSPIN SMEAR    CULT  11/05/2022    NO GROWTH 3 DAYS Performed at [facility], [address], [place], [place] [address]  LABORGA NO GROWTH 2 DAYS 06/24/2012     Lab Results  Component Value Date   ALBUMIN 4.0 02/19/2023   ALBUMIN 3.9 11/05/2022   ALBUMIN 3.9 04/11/2022    Lab Results  Component Value Date   MG 2.1 04/12/2022   MG 2.1 07/11/2020   MG 1.6 03/20/2012   Lab Results  Component Value Date   VD25OH 23 (L) 10/02/2021   VD25OH 25.81 (L) 10/30/2014    No results found for: "PREALBUMIN"    Latest Ref Rng & Units 03/11/2024   10:46 AM 09/28/2023   10:21 AM 02/19/2023    5:55 PM  CBC EXTENDED  WBC 3.8 - 10.8 Thousand/uL 5.7  2.6  5.7   RBC 3.80 - 5.10 Million/uL 4.22  3.92  3.95   Hemoglobin 11.7 - 15.5 g/dL  82.9  56.2  13.0   HCT 35.0 - 45.0 % 38.9  37.0  37.4   Platelets 140 - 400 Thousand/uL 312  162  243   NEUT# 1,500 - 7,800 cells/uL 2,924  1,368  2.6   Lymph# 0.7 - 4.0 K/uL   2.1      There is no height or weight on file to calculate BMI.  Orders:  No orders of the defined types were placed in this encounter.  No orders of the defined types were placed in this encounter.    Procedures: No procedures performed  Clinical Data: No additional findings.  ROS:  All other systems negative, except as noted in the HPI. Review of Systems  Objective: Vital Signs: There were no vitals taken for this visit.  Specialty Comments:  No specialty comments available.  PMFS History: 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 05/09/2020   Hypertension    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HCC)    Seizure (HCC)    Toxoplasmosis 11/07/2019   AIDS (acquired immune deficiency syndrome) (HCC) 11/07/2019   Chronic pelvic pain in female 01/04/2019   Tuberculosis    Complex regional pain syndrome 02/03/2017   Headache 10/28/2016   Primary adrenal insufficiency (HCC) 01/03/2015   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   Acute right-sided low back pain with right-sided sciatica 08/24/2017   Adrenal insufficiency (HCC)    Anemia of chronic disease 03/11/2012   ASCUS with positive high risk HPV cervical 09/14/2017   Avascular necrosis of bone of left hip (HCC) 12/14/2015   Avascular necrosis of bone of right hip (HCC) 04/04/2016   Back pain of lumbar region  with sciatica 02/12/2015   Bell's palsy 08/26/2013   Brain lesion    Brain tumor (HCC) 05/16/2020   Bullae 05/30/2012   Cerebral edema (HCC) 10/28/2019   Cervical dysplasia, mild 12/06/2020   Chronic back pain    Chronic kidney disease    Chronic leg pain    bilateral knees, ankles   Chronic pelvic pain in female 01/04/2019   Complex regional pain syndrome 02/03/2017   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HCC)    Depression    Eczema 02/28/2022   Encephalitis, myelitis, 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  Fatigue    GERD (gastroesophageal reflux disease)    Headache    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   HIV (human immunodeficiency virus infection) (HCC) 02/2012   Hypertension    Laceration of ankle, right 11/18/2012   Lower abdominal pain 06/21/2018   Lumbar radiculopathy    Meningitis 02/18/2018   Pain of upper abdomen    Pelvic pain    PID (acute pelvic inflammatory disease) 02/26/2018   Pneumonia    Reflux esophagitis 03/11/2012   Rotator cuff strain 01/26/2020   S/P craniotomy 05/16/2020   Seizure (HCC)    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   Suicide ideation    Syphilis 02/26/2018   Tendinopathy of left shoulder 01/18/2019   TOA (tubo-ovarian abscess) 04/15/2021   Tuberculosis    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   Tubo-ovarian abscess 04/25/2021   Vertigo    Wears glasses     Family History  Problem Relation Age of Onset   Heart disease Father        Vague not clearly cardiac   Hypertension Mother     Past Surgical History:  Procedure Laterality Date   APPENDECTOMY  ~ 2000   APPLICATION OF CRANIAL NAVIGATION N/A 05/16/2020   Procedure: APPLICATION OF CRANIAL NAVIGATION;  Surgeon: Garry Kansas, MD;  Location: [facility] OR;  Service: Neurosurgery;  Laterality: N/A;   CRANIOTOMY Right 05/16/2020   Procedure:  Craniotomy for Resection of Lesion;  Surgeon: Garry Kansas, MD;  Location: [facility] OR;  Service: Neurosurgery;  Laterality: Right;  right   CYSTOSCOPY WITH RETROGRADE PYELOGRAM, URETEROSCOPY AND STENT PLACEMENT Left 01/05/2023   Procedure: CYSTOSCOPY WITH BILATERAL RETROGRADE PYELOGRAM, LEFT DIAGNOSTIC URETEROSCOPY AND BLADDER BIOPSY;  Surgeon: Lahoma Pigg, MD;  Location: [facility] ORS;  Service: Urology;  Laterality: Left;  30 MINUTES NEEDED FOR CASE  ANESTHESIA INTUBATED, PARALYZED   DILATION AND CURETTAGE OF UTERUS  12/15/2006   ESOPHAGOGASTRODUODENOSCOPY  03/11/2012   Procedure: ESOPHAGOGASTRODUODENOSCOPY (EGD);  Surgeon: Pietro Bridegroom, MD;  Location: [facility] ENDOSCOPY;  Service: Endoscopy;  Laterality: N/A;   ESOPHAGOGASTRODUODENOSCOPY N/A 03/07/2014   Procedure: ESOPHAGOGASTRODUODENOSCOPY (EGD);  Surgeon: Kenney Peacemaker, MD;  [facility];  Service: Endoscopy;  Laterality: N/A;   LAPAROSCOPIC OOPHERECTOMY  05/2021   [facility] - LSO and right salpingectomy   LUNG BIOPSY  02/13/2012   Salpingectomy Left 05/2021   TOTAL HIP ARTHROPLASTY Left 12/14/2015   Procedure: LEFT TOTAL HIP ARTHROPLASTY ANTERIOR APPROACH;  Surgeon: Arnie Lao, MD;  Location: [facility]S;  Service: Orthopedics;  Laterality: Left;   TOTAL HIP ARTHROPLASTY Right 04/04/2016   Procedure: RIGHT TOTAL HIP ARTHROPLASTY ANTERIOR APPROACH;  Surgeon: Arnie Lao, MD;  Location: [facility]S;  Service: Orthopedics;  Laterality: Right;   Social History   Occupational History   Occupation: CNA  Tobacco Use   Smoking status: Never   Smokeless tobacco: Never  Vaping Use   Vaping status: Never Used  Substance and Sexual Activity   Alcohol use: Not Currently    Comment: socially   Drug use: No   Sexual activity: Yes    Partners: Male    Birth control/protection: I.U.D.

## 2024-05-04 ENCOUNTER — Other Ambulatory Visit: Payer: Self-pay

## 2024-05-04 ENCOUNTER — Other Ambulatory Visit (HOSPITAL_COMMUNITY): Payer: Self-pay

## 2024-05-04 ENCOUNTER — Ambulatory Visit (INDEPENDENT_AMBULATORY_CARE_PROVIDER_SITE_OTHER): Admitting: Pharmacist

## 2024-05-04 DIAGNOSIS — Z23 Encounter for immunization: Secondary | ICD-10-CM | POA: Diagnosis not present

## 2024-05-04 DIAGNOSIS — B20 Human immunodeficiency virus [HIV] disease: Secondary | ICD-10-CM

## 2024-05-04 MED ORDER — HYDROCODONE-ACETAMINOPHEN 5-325 MG PO TABS
1.0000 | ORAL_TABLET | Freq: Four times a day (QID) | ORAL | 0 refills | Status: AC | PRN
  Filled 2024-05-04: qty 20, 5d supply, fill #0

## 2024-05-04 MED ORDER — CABOTEGRAVIR & RILPIVIRINE ER 400 & 600 MG/2ML IM SUER
1.0000 | Freq: Once | INTRAMUSCULAR | Status: AC
  Administered 2024-05-04: 1 via INTRAMUSCULAR

## 2024-05-04 MED ORDER — SULFAMETHOXAZOLE-TRIMETHOPRIM 800-160 MG PO TABS: 1.0000 | ORAL_TABLET | Freq: Every day | ORAL | 1 refills | Status: AC

## 2024-05-04 MED ORDER — NALOXONE HCL 4 MG/0.1ML NA LIQD
NASAL | 4 refills | Status: AC
  Filled 2024-05-04: qty 2, 1d supply, fill #0

## 2024-05-05 ENCOUNTER — Other Ambulatory Visit (HOSPITAL_COMMUNITY): Payer: Self-pay

## 2024-05-06 LAB — HIV-1 RNA QUANT-NO REFLEX-BLD
HIV 1 RNA Quant: 31 {copies}/mL — ABNORMAL HIGH
HIV-1 RNA Quant, Log: 1.49 {Log_copies}/mL — ABNORMAL HIGH

## 2024-05-13 ENCOUNTER — Other Ambulatory Visit: Payer: Self-pay | Admitting: Family Medicine

## 2024-05-16 ENCOUNTER — Other Ambulatory Visit (HOSPITAL_COMMUNITY): Payer: Self-pay

## 2024-05-30 ENCOUNTER — Other Ambulatory Visit: Payer: Self-pay

## 2024-05-30 ENCOUNTER — Other Ambulatory Visit (HOSPITAL_COMMUNITY): Payer: Self-pay

## 2024-05-30 NOTE — Progress Notes (Signed)
 Specialty Pharmacy Refill Coordination Note  Kelsey Rodriguez is a 33 y.o. female assessed today regarding refills of clinic administered specialty medication(s) Cabotegravir  & Rilpivirine  (Cabenuva )   Clinic requested Courier to Provider Office   Delivery date: 06/06/24   Verified address: [address] [employer] [address] [place] [address]   Medication will be filled on 06/03/24.

## 2024-05-31 ENCOUNTER — Encounter: Payer: Self-pay | Admitting: Orthopedic Surgery

## 2024-05-31 ENCOUNTER — Other Ambulatory Visit (INDEPENDENT_AMBULATORY_CARE_PROVIDER_SITE_OTHER)

## 2024-05-31 ENCOUNTER — Ambulatory Visit (INDEPENDENT_AMBULATORY_CARE_PROVIDER_SITE_OTHER): Admitting: Orthopedic Surgery

## 2024-05-31 DIAGNOSIS — M25561 Pain in right knee: Secondary | ICD-10-CM

## 2024-05-31 DIAGNOSIS — M79671 Pain in right foot: Secondary | ICD-10-CM

## 2024-05-31 DIAGNOSIS — M25461 Effusion, right knee: Secondary | ICD-10-CM | POA: Diagnosis not present

## 2024-05-31 MED ORDER — METHYLPREDNISOLONE ACETATE 40 MG/ML IJ SUSP
40.0000 mg | INTRAMUSCULAR | Status: AC | PRN
Start: 2024-05-31 — End: 2024-05-31
  Administered 2024-05-31: 40 mg via INTRA_ARTICULAR

## 2024-05-31 MED ORDER — LIDOCAINE HCL (PF) 1 % IJ SOLN
5.0000 mL | INTRAMUSCULAR | Status: AC | PRN
  Administered 2024-05-31: 5 mL

## 2024-05-31 NOTE — Progress Notes (Signed)
 Office Visit Note   Patient: Kelsey Rodriguez           Date of Birth: 1991-11-10           MRN: [id number] Visit Date: 05/31/2024              Requested by: [facility] [address] [place],  [place] [address] PCP: [facility], [facility]  Chief Complaint  Patient presents with   Right Foot - Pain      HPI: Patient is a 33 year old woman who is seen in follow-up for right foot pain.  She states the right foot pain is getting better however she states she has an acute effusion of the right knee with pain with activities.  She states that with activity standing greater than 5 minutes increases her swelling.  She has used Mobic  and a postoperative shoe on the right foot.  Patient states her mother has arthritis primarily in her hands and her mother is also treated for gout.  Assessment & Plan: Visit Diagnoses:  1. Pain in right foot   2. Acute pain of right knee   3. Effusion, right knee     Plan: Patient's right knee was injected she tolerated this well blood work is drawn for uric acid.  Will call with the results and start medication if needed.  Follow-Up Instructions: No follow-ups on file.   Ortho Exam  Patient is alert, oriented, no adenopathy, well-dressed, normal affect, normal respiratory effort. Examination of the right foot patient has no redness or cellulitis.  Symptomatically she is better.  She has good ankle and subtalar range of motion.  Examination of the right knee she has full range of motion there is a tense effusion.  Medial lateral joint lines are nontender to palpation the patellofemoral joint is not tender to palpation.  There is no crepitation with range of motion collaterals cruciates are stable.    Imaging: XR Knee 1-2 Views Right Result Date: 05/31/2024 2 view radiographs of the right knee shows a congruent joint space without periarticular cystic changes.  XR Foot Complete Right Result Date: 05/31/2024 Three-view radiographs of the  right foot shows no evidence of a subtalar bar.  There are no periarticular cystic changes.  No fractures.  There is talar beaking.  No images are attached to the encounter.  Labs: Lab Results  Component Value Date   HGBA1C 5.1 04/20/2015   ESRSEDRATE 60 (H) 03/07/2020   ESRSEDRATE 34 (H) 10/24/2014   ESRSEDRATE 47 (H) 10/05/2014   CRP 9.5 (H) 03/07/2020   CRP <0.5 10/24/2014   CRP <0.5 10/05/2014   LABURIC 7.9 (H) 05/20/2012   LABURIC 2.8 03/11/2012   REPTSTATUS 11/09/2022 FINAL 11/05/2022   GRAMSTAIN  11/05/2022    WBC PRESENT, PREDOMINANTLY MONONUCLEAR NO ORGANISMS SEEN CYTOSPIN SMEAR    CULT  11/05/2022    NO GROWTH 3 DAYS Performed at [facility], [address] [place] [address], [place], [place] [address]    LABORGA NO GROWTH 2 DAYS 06/24/2012     Lab Results  Component Value Date   ALBUMIN 4.0 02/19/2023   ALBUMIN 3.9 11/05/2022   ALBUMIN 3.9 04/11/2022    Lab Results  Component Value Date   MG 2.1 04/12/2022   MG 2.1 07/11/2020   MG 1.6 03/20/2012   Lab Results  Component Value Date   VD25OH 23 (L) 10/02/2021   VD25OH 25.81 (L) 10/30/2014    No results found for: PREALBUMIN    Latest Ref Rng &  Units 03/11/2024   10:46 AM 09/28/2023   10:21 AM 02/19/2023    5:55 PM  CBC EXTENDED  WBC 3.8 - 10.8 Thousand/uL 5.7  2.6  5.7   RBC 3.80 - 5.10 Million/uL 4.22  3.92  3.95   Hemoglobin 11.7 - 15.5 g/dL 16.1  09.6  04.5   HCT 35.0 - 45.0 % 38.9  37.0  37.4   Platelets 140 - 400 Thousand/uL 312  162  243   NEUT# 1,500 - 7,800 cells/uL 2,924  1,368  2.6   Lymph# 0.7 - 4.0 K/uL   2.1      There is no height or weight on file to calculate BMI.  Orders:  Orders Placed This Encounter  Procedures   XR Foot Complete Right   XR Knee 1-2 Views Right   Uric acid   No orders of the defined types were placed in this encounter.    Procedures: Large Joint Inj: R knee on 05/31/2024 1:56 PM Indications: pain and diagnostic evaluation Details: 22 G 1.5 in needle,  anteromedial approach  Arthrogram: No  Medications: 5 mL lidocaine  (PF) 1 %; 40 mg methylPREDNISolone  acetate 40 MG/ML Outcome: tolerated well, no immediate complications Procedure, treatment alternatives, risks and benefits explained, specific risks discussed. Consent was given by the patient. Immediately prior to procedure a time out was called to verify the correct patient, procedure, equipment, support staff and site/side marked as required. Patient was prepped and draped in the usual sterile fashion.      Clinical Data: No additional findings.  ROS:  All other systems negative, except as noted in the HPI. Review of Systems  Objective: Vital Signs: There were no vitals taken for this visit.  Specialty Comments:  No specialty comments available.  PMFS History: 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 05/09/2020   Hypertension    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HCC)    Seizure (HCC)    Toxoplasmosis 11/07/2019   AIDS (acquired immune deficiency syndrome) (HCC) 11/07/2019   Chronic pelvic pain in female 01/04/2019   Tuberculosis    Complex regional pain syndrome 02/03/2017   Headache 10/28/2016   Primary adrenal insufficiency (HCC) 01/03/2015   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   Acute right-sided low back pain with right-sided sciatica 08/24/2017   Adrenal insufficiency (HCC)    Anemia of chronic disease 03/11/2012   ASCUS with positive high risk HPV cervical 09/14/2017   Avascular necrosis of bone of left hip (HCC) 12/14/2015    Avascular necrosis of bone of right hip (HCC) 04/04/2016   Back pain of lumbar region with sciatica 02/12/2015   Bell's palsy 08/26/2013   Brain lesion    Brain tumor (HCC) 05/16/2020   Bullae 05/30/2012   Cerebral edema (HCC) 10/28/2019   Cervical dysplasia, mild 12/06/2020   Chronic back pain    Chronic kidney disease    Chronic leg pain    bilateral knees, ankles   Chronic pelvic pain in female 01/04/2019   Complex regional pain syndrome 02/03/2017   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HCC)  Depression    Eczema 02/28/2022   Encephalitis, myelitis, 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   Fatigue    GERD (gastroesophageal reflux disease)    Headache    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   HIV (human immunodeficiency virus infection) (HCC) 02/2012   Hypertension    Laceration of ankle, right 11/18/2012   Lower abdominal pain 06/21/2018   Lumbar radiculopathy    Meningitis 02/18/2018   Pain of upper abdomen    Pelvic pain    PID (acute pelvic inflammatory disease) 02/26/2018   Pneumonia    Reflux esophagitis 03/11/2012   Rotator cuff strain 01/26/2020   S/P craniotomy 05/16/2020   Seizure (HCC)    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   Suicide ideation    Syphilis 02/26/2018   Tendinopathy of left shoulder 01/18/2019   TOA (tubo-ovarian abscess) 04/15/2021   Tuberculosis    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   Tubo-ovarian abscess 04/25/2021   Vertigo    Wears glasses     Family History  Problem Relation Age of Onset   Heart disease Father        Vague not clearly cardiac   Hypertension Mother     Past Surgical History:  Procedure Laterality Date   APPENDECTOMY  ~ 2000   APPLICATION OF CRANIAL NAVIGATION N/A 05/16/2020   Procedure: APPLICATION OF CRANIAL NAVIGATION;  Surgeon: Garry Kansas, MD;  Location: [facility] OR;   Service: Neurosurgery;  Laterality: N/A;   CRANIOTOMY Right 05/16/2020   Procedure: Craniotomy for Resection of Lesion;  Surgeon: Garry Kansas, MD;  Location: [facility] OR;  Service: Neurosurgery;  Laterality: Right;  right   CYSTOSCOPY WITH RETROGRADE PYELOGRAM, URETEROSCOPY AND STENT PLACEMENT Left 01/05/2023   Procedure: CYSTOSCOPY WITH BILATERAL RETROGRADE PYELOGRAM, LEFT DIAGNOSTIC URETEROSCOPY AND BLADDER BIOPSY;  Surgeon: Lahoma Pigg, MD;  Location: [facility];  Service: Urology;  Laterality: Left;  30 MINUTES NEEDED FOR CASE  ANESTHESIA INTUBATED, PARALYZED   DILATION AND CURETTAGE OF UTERUS  12/15/2006   ESOPHAGOGASTRODUODENOSCOPY  03/11/2012   Procedure: ESOPHAGOGASTRODUODENOSCOPY (EGD);  Surgeon: Pietro Bridegroom, MD;  Location: [facility] ENDOSCOPY;  Service: Endoscopy;  Laterality: N/A;   ESOPHAGOGASTRODUODENOSCOPY N/A 03/07/2014   Procedure: ESOPHAGOGASTRODUODENOSCOPY (EGD);  Surgeon: Kenney Peacemaker, MD;  Location: [facility] ENDOSCOPY;  Service: Endoscopy;  Laterality: N/A;   LAPAROSCOPIC OOPHERECTOMY  05/2021   [facility] - LSO and right salpingectomy   LUNG BIOPSY  02/13/2012   Salpingectomy Left 05/2021   TOTAL HIP ARTHROPLASTY Left 12/14/2015   Procedure: LEFT TOTAL HIP ARTHROPLASTY ANTERIOR APPROACH;  Surgeon: Arnie Lao, MD;  Location: [facility];  Service: Orthopedics;  Laterality: Left;   TOTAL HIP ARTHROPLASTY Right 04/04/2016   Procedure: RIGHT TOTAL HIP ARTHROPLASTY ANTERIOR APPROACH;  Surgeon: Arnie Lao, MD;  Location: [facility];  Service: Orthopedics;  Laterality: Right;   Social History   Occupational History   Occupation: CNA  Tobacco Use   Smoking status: Never   Smokeless tobacco: Never  Vaping Use   Vaping status: Never Used  Substance and Sexual Activity   Alcohol use: Not Currently    Comment: socially   Drug use: No   Sexual activity: Yes    Partners: Male    Birth control/protection: I.U.D.

## 2024-06-01 ENCOUNTER — Other Ambulatory Visit: Payer: Self-pay

## 2024-06-01 LAB — URIC ACID: Uric Acid, Serum: 5.2 mg/dL (ref 2.5–7.0)

## 2024-06-01 MED ORDER — COLCHICINE 0.6 MG PO TABS: 0.6000 mg | ORAL_TABLET | Freq: Every day | ORAL | 3 refills | Status: AC

## 2024-06-02 ENCOUNTER — Ambulatory Visit: Payer: Self-pay

## 2024-06-02 NOTE — Telephone Encounter (Signed)
-----   Message from Timothy Ford sent at 06/01/2024  9:20 AM EDT ----- Call patient.  Her uric acid was within the normal range but this is common to happen after an acute gout attack.  Her uric acid has been elevated in the past consistent with gout.  Please:  Colchicine 0.6 mg 1 p.o. daily and have her follow-up in 3 months and we will repeat her uric acid.  Have her follow-up sooner if her joint pains recur after the injections. ----- Message ----- From: Interface, [facility] Lab Results In Sent: 06/01/2024  12:45 AM EDT To: Timothy Ford, MD

## 2024-06-02 NOTE — Telephone Encounter (Signed)
 Called pt to advise of message below. Verbalized understanding and will call with any questions.

## 2024-06-03 ENCOUNTER — Other Ambulatory Visit: Payer: Self-pay

## 2024-06-03 NOTE — Progress Notes (Deleted)
 HPI: Kelsey Rodriguez is a 33 y.o. female who presents to the [facility] pharmacy clinic for Cabenuva  administration.  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   AMITRIPTYLINE  (ELAVIL ) 25 MG TABLET    Take 1 tablet (25 mg total) by mouth at bedtime.   CABOTEGRAVIR  & RILPIVIRINE  ER (CABENUVA ) 400 & 600 MG/2ML INJECTION    Inject 1 kit into the muscle every 30 (thirty) days.   CLOTRIMAZOLE -BETAMETHASONE  (LOTRISONE ) CREAM    Apply topically 2 (two) times daily as needed.   COLCHICINE 0.6 MG TABLET    Take 1 tablet (0.6 mg total) by mouth daily.   DIPHENHYDRAMINE  (BENADRYL ) 25 MG TABLET    Take 25 mg by mouth every 6 (six) hours as needed (when taking oxycodone ).   DOCUSATE SODIUM  (COLACE) 100 MG CAPSULE    Take 1 capsule (100 mg total) by mouth daily as needed for up to 30 doses.   ENSURE (ENSURE)    Take 1 Can by mouth 2 (two) times daily between meals.   HYDROCODONE -ACETAMINOPHEN  (NORCO/VICODIN) 5-325 MG TABLET    Take 1 tablet by mouth every  6 (six) hours as needed for pain.   HYDROXYPROPYL METHYLCELLULOSE / HYPROMELLOSE (ISOPTO TEARS / GONIOVISC) 2.5 % OPHTHALMIC SOLUTION    Place 1 drop into both eyes as needed for dry eyes.   IBUPROFEN  (ADVIL ) 800 MG TABLET    Take 800 mg by mouth 2 (two) times daily as needed for moderate pain.   MELOXICAM  (MOBIC ) 15 MG TABLET    Take 1 tablet daily with food for 7 days. Then take as needed.   NALOXONE  (NARCAN ) NASAL SPRAY 4 MG/0.1 ML    Instill 1 spray every 2 minutes for opioid overdose;spray 1 dose into ONE nostril; alternate nostrils w each dose until help arrives   POLYETHYLENE GLYCOL 3350  (MIRALAX  PO)    Take 17 g by mouth daily.   SULFAMETHOXAZOLE -TRIMETHOPRIM  (BACTRIM  DS) 800-160 MG TABLET    Take 1 tablet by mouth daily.   TRANEXAMIC ACID  (LYSTEDA ) 650 MG TABS TABLET    Take 2 tablets (1,300 mg total) by mouth 3 (three) times daily. 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   Bullae 05/30/2012    Cerebral edema (HCC) 10/28/2019   Cervical dysplasia, mild 12/06/2020   Chronic back pain    Chronic kidney disease    Chronic leg pain    bilateral knees, ankles   Chronic pelvic pain in female 01/04/2019   Complex regional pain syndrome 02/03/2017   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HCC)    Depression    Eczema 02/28/2022   Encephalitis, myelitis, 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   Fatigue    GERD (gastroesophageal reflux disease)    Headache    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   HIV (human immunodeficiency virus infection) (HCC) 02/2012   Hypertension    Laceration of ankle, right 11/18/2012   Lower abdominal pain 06/21/2018   Lumbar radiculopathy    Meningitis 02/18/2018   Pain of upper abdomen    Pelvic pain    PID (acute pelvic inflammatory disease) 02/26/2018   Pneumonia    Reflux esophagitis 03/11/2012   Rotator cuff strain 01/26/2020   S/P craniotomy 05/16/2020   Seizure (HCC)    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   Suicide ideation    Syphilis 02/26/2018   Tendinopathy of left shoulder 01/18/2019   TOA (tubo-ovarian abscess) 04/15/2021   Tuberculosis    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     Drinks about 1 soda a day    Social Drivers of Corporate investment banker Strain: Not on file  Food Insecurity: Food Insecurity Present (01/13/2024)   Hunger Vital Sign    Worried About Running Out of Food in the Last Year: Sometimes true    Ran Out of Food in the Last Year: Sometimes true  Transportation Needs: No Transportation Needs (01/13/2024)   PRAPARE - Administrator, Civil Service (Medical): No    Lack  of Transportation (Non-Medical): No  Physical Activity: Not on file  Stress: Not on file  Social Connections: Unknown (04/13/2022)   Received from [facility]   Social Network    Social Network: Not on file    Labs: Lab Results  Component Value Date   HIV1RNAQUANT 31 (H) 05/04/2024   HIV1RNAQUANT 53 (H) 04/06/2024   HIV1RNAQUANT NOT DETECTED 03/11/2024   CD4TABS <35 (L) 09/28/2023   CD4TABS 126 (L) 03/02/2023   CD4TABS 115 (L) 11/27/2022    RPR and STI Lab Results  Component Value Date   LABRPR REACTIVE (A) 03/11/2024   LABRPR REACTIVE (A) 09/28/2023   LABRPR REACTIVE (A) 11/27/2022   LABRPR REACTIVE (A) 08/12/2022   LABRPR Reactive (A) 04/12/2022   RPRTITER 1:1 (H) 03/11/2024   RPRTITER 1:1 (H) 09/28/2023   RPRTITER 1:1 (H) 11/27/2022   RPRTITER 1:1 (H) 08/12/2022   RPRTITER 1:1 (H) 02/28/2022    STI Results GC CT  09/28/2023 10:51 AM Negative  Negative   03/18/2022 11:17 AM Negative  Negative   01/02/2022  3:50 PM Negative  Negative   04/14/2021 10:35 PM Negative  Negative   11/01/2020  3:18 PM Negative  Negative   08/09/2018 12:00 AM Negative  Negative   02/18/2018 12:00 AM Negative  Negative   02/12/2018 12:00 AM Negative  **POSITIVE**   11/20/2017 12:00 AM Negative  Negative   05/23/2016 12:00 AM Negative  Negative   01/23/2015 12:00 AM NG: Negative  CT: Negative     Hepatitis B Lab Results  Component Value Date   HEPBSAB POS (A) 09/13/2015   HEPBSAG  NEGATIVE 03/13/2012   HEPBCAB NEGATIVE 03/13/2012   Hepatitis C Lab Results  Component Value Date   HEPCAB NON-REACTIVE 03/17/2022   Hepatitis A Lab Results  Component Value Date   HAV POSITIVE (A) 03/13/2012   Lipids: Lab Results  Component Value Date   CHOL 228 (H) 03/11/2024   TRIG 112 03/11/2024   HDL 72 03/11/2024   CHOLHDL 3.2 03/11/2024   VLDL 29 12/25/2016   LDLCALC 134 (H) 03/11/2024    TARGET DATE:  The 26th of the month  Assessment: Kelsey Rodriguez presents today for their maintenance Cabenuva  injections. Initial/past injections were tolerated well without issues. No problems with systemic effects of injections.   Administered cabotegravir  600mg /24mL in left upper outer quadrant of the gluteal muscle. Administered rilpivirine  900 mg/3mL in the right upper outer quadrant of the gluteal muscle. Monitored patient for 10 minutes after injection. Injections were tolerated well without issue. Patient will follow up in 2 months for next injection. Will skip HIV RNA testing as it was recently checked in May and resulted < 50.  Due for 2/2 Shingrix  dose which she ***.   Plan: - Cabenuva  injections administered - Next injections scheduled for 7/28 with Dr. Levern Reader and *** with ***  - Call with any issues or questions  Nicklas Barns, PharmD, CPP, BCIDP, AAHIVP Clinical Pharmacist Practitioner Infectious Diseases Clinical Pharmacist [facility]

## 2024-06-06 ENCOUNTER — Telehealth: Payer: Self-pay

## 2024-06-06 NOTE — Telephone Encounter (Signed)
 [facility] Patient Advocate Encounter  Patient's medications Cabenuva  have been couriered to [facility] from [facility] and will be administered at the patients appointment on 06/08/24.  Arland Hutchinson, CPhT Specialty Pharmacy Patient [facility] Phone: [phone] Fax:  [phone]

## 2024-06-08 ENCOUNTER — Ambulatory Visit: Admitting: Pharmacist

## 2024-06-13 ENCOUNTER — Ambulatory Visit: Admitting: Pharmacist

## 2024-06-13 ENCOUNTER — Other Ambulatory Visit: Payer: Self-pay

## 2024-06-13 DIAGNOSIS — B20 Human immunodeficiency virus [HIV] disease: Secondary | ICD-10-CM | POA: Diagnosis not present

## 2024-06-13 MED ORDER — CABOTEGRAVIR & RILPIVIRINE ER 400 & 600 MG/2ML IM SUER
1.0000 | Freq: Once | INTRAMUSCULAR | Status: AC
  Administered 2024-06-13: 1 via INTRAMUSCULAR

## 2024-06-13 NOTE — Progress Notes (Signed)
 HPI: Kelsey Rodriguez is a 33 y.o. female who presents to the [facility] pharmacy clinic for Cabenuva  administration.  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   AMITRIPTYLINE  (ELAVIL ) 25 MG TABLET    Take 1 tablet (25 mg total) by mouth at bedtime.   CABOTEGRAVIR  & RILPIVIRINE  ER (CABENUVA ) 400 & 600 MG/2ML INJECTION    Inject 1 kit into the muscle every 30 (thirty) days.   CLOTRIMAZOLE -BETAMETHASONE  (LOTRISONE ) CREAM    Apply topically 2 (two) times daily as needed.   COLCHICINE  0.6 MG TABLET    Take 1 tablet (0.6 mg total) by mouth daily.   DIPHENHYDRAMINE  (BENADRYL ) 25 MG TABLET    Take 25 mg by mouth every 6 (six) hours as needed (when taking oxycodone ).   DOCUSATE SODIUM  (COLACE) 100 MG CAPSULE    Take 1 capsule (100 mg total) by mouth daily as needed for up to 30 doses.   ENSURE (ENSURE)    Take 1 Can by mouth 2 (two) times daily between meals.   HYDROCODONE -ACETAMINOPHEN  (NORCO/VICODIN) 5-325 MG TABLET    Take 1 tablet by mouth every  6 (six) hours as needed for pain.   HYDROXYPROPYL METHYLCELLULOSE / HYPROMELLOSE (ISOPTO TEARS / GONIOVISC) 2.5 % OPHTHALMIC SOLUTION    Place 1 drop into both eyes as needed for dry eyes.   IBUPROFEN  (ADVIL ) 800 MG TABLET    Take 800 mg by mouth 2 (two) times daily as needed for moderate pain.   MELOXICAM  (MOBIC ) 15 MG TABLET    Take 1 tablet daily with food for 7 days. Then take as needed.   NALOXONE  (NARCAN ) NASAL SPRAY 4 MG/0.1 ML    Instill 1 spray every 2 minutes for opioid overdose;spray 1 dose into ONE nostril; alternate nostrils w each dose until help arrives   POLYETHYLENE GLYCOL 3350  (MIRALAX  PO)    Take 17 g by mouth daily.   SULFAMETHOXAZOLE -TRIMETHOPRIM  (BACTRIM  DS) 800-160 MG TABLET    Take 1 tablet by mouth daily.   TRANEXAMIC ACID  (LYSTEDA ) 650 MG TABS TABLET    Take 2 tablets (1,300 mg total) by mouth 3 (three) times daily. 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   Bullae 05/30/2012    Cerebral edema (HCC) 10/28/2019   Cervical dysplasia, mild 12/06/2020   Chronic back pain    Chronic kidney disease    Chronic leg pain    bilateral knees, ankles   Chronic pelvic pain in female 01/04/2019   Complex regional pain syndrome 02/03/2017   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HCC)    Depression    Eczema 02/28/2022   Encephalitis, myelitis, 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   Fatigue    GERD (gastroesophageal reflux disease)    Headache    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   HIV (human immunodeficiency virus infection) (HCC) 02/2012   Hypertension    Laceration of ankle, right 11/18/2012   Lower abdominal pain 06/21/2018   Lumbar radiculopathy    Meningitis 02/18/2018   Pain of upper abdomen    Pelvic pain    PID (acute pelvic inflammatory disease) 02/26/2018   Pneumonia    Reflux esophagitis 03/11/2012   Rotator cuff strain 01/26/2020   S/P craniotomy 05/16/2020   Seizure (HCC)    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   Suicide ideation    Syphilis 02/26/2018   Tendinopathy of left shoulder 01/18/2019   TOA (tubo-ovarian abscess) 04/15/2021   Tuberculosis    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     Drinks about 1 soda a day    Social Drivers of Corporate investment banker Strain: Not on file  Food Insecurity: Food Insecurity Present (01/13/2024)   Hunger Vital Sign    Worried About Running Out of Food in the Last Year: Sometimes true    Ran Out of Food in the Last Year: Sometimes true  Transportation Needs: No Transportation Needs (01/13/2024)   PRAPARE - Administrator, Civil Service (Medical): No    Lack  of Transportation (Non-Medical): No  Physical Activity: Not on file  Stress: Not on file  Social Connections: Unknown (04/13/2022)   Received from [facility]   Social Network    Social Network: Not on file    Labs: Lab Results  Component Value Date   HIV1RNAQUANT 31 (H) 05/04/2024   HIV1RNAQUANT 53 (H) 04/06/2024   HIV1RNAQUANT NOT DETECTED 03/11/2024   CD4TABS <35 (L) 09/28/2023   CD4TABS 126 (L) 03/02/2023   CD4TABS 115 (L) 11/27/2022    RPR and STI Lab Results  Component Value Date   LABRPR REACTIVE (A) 03/11/2024   LABRPR REACTIVE (A) 09/28/2023   LABRPR REACTIVE (A) 11/27/2022   LABRPR REACTIVE (A) 08/12/2022   LABRPR Reactive (A) 04/12/2022   RPRTITER 1:1 (H) 03/11/2024   RPRTITER 1:1 (H) 09/28/2023   RPRTITER 1:1 (H) 11/27/2022   RPRTITER 1:1 (H) 08/12/2022   RPRTITER 1:1 (H) 02/28/2022    STI Results GC CT  09/28/2023 10:51 AM Negative  Negative   03/18/2022 11:17 AM Negative  Negative   01/02/2022  3:50 PM Negative  Negative   04/14/2021 10:35 PM Negative  Negative   11/01/2020  3:18 PM Negative  Negative   08/09/2018 12:00 AM Negative  Negative   02/18/2018 12:00 AM Negative  Negative   02/12/2018 12:00 AM Negative  **POSITIVE**   11/20/2017 12:00 AM Negative  Negative   05/23/2016 12:00 AM Negative  Negative   01/23/2015 12:00 AM NG: Negative  CT: Negative     Hepatitis B Lab Results  Component Value Date   HEPBSAB POS (A) 09/13/2015   HEPBSAG  NEGATIVE 03/13/2012   HEPBCAB NEGATIVE 03/13/2012   Hepatitis C Lab Results  Component Value Date   HEPCAB NON-REACTIVE 03/17/2022   Hepatitis A Lab Results  Component Value Date   HAV POSITIVE (A) 03/13/2012   Lipids: Lab Results  Component Value Date   CHOL 228 (H) 03/11/2024   TRIG 112 03/11/2024   HDL 72 03/11/2024   CHOLHDL 3.2 03/11/2024   VLDL 29 12/25/2016   LDLCALC 134 (H) 03/11/2024    TARGET DATE:  The 26th of the month  Assessment: Nautika presents today for their maintenance Cabenuva  injections. Past injections were tolerated well without issues. No problems with systemic effects of injections. Last HIV RNA was <50 in May so will defer labs today. Due for last Shingles vaccine at her next appointment in July.  Administered cabotegravir  400mg /85mL in right upper outer quadrant of the gluteal muscle. Administered rilpivirine  600 mg/2mL in the left upper outer quadrant of the gluteal muscle. No issues with injections. She will follow up in 1 month.  Plan: - Cabenuva  injections administered - Next injections scheduled for 7/28 with Dr. Luiz and 08/02/24 with me  - Call with any issues or questions  Isaac Dubie L. Ameisha Mcclellan, PharmD, BCIDP, AAHIVP, CPP Infectious Diseases Clinical Pharmacist Practitioner Clinical Pharmacist Lead, Specialty Pharmacy [facility] 06/13/2024, 4:40 PM

## 2024-06-27 ENCOUNTER — Other Ambulatory Visit: Payer: Self-pay

## 2024-06-27 ENCOUNTER — Other Ambulatory Visit (HOSPITAL_COMMUNITY): Payer: Self-pay

## 2024-06-27 NOTE — Progress Notes (Signed)
 Specialty Pharmacy Refill Coordination Note  Catrice Bih Ey is a 33 y.o. female assessed today regarding refills of clinic administered specialty medication(s) Cabotegravir  & Rilpivirine  (Cabenuva )   Clinic requested Courier to Provider Office   Delivery date: 07/04/24   Verified address: [address] [employer] [address] [place] [address]   Medication will be filled on 07/01/24.

## 2024-07-01 ENCOUNTER — Other Ambulatory Visit: Payer: Self-pay

## 2024-07-04 ENCOUNTER — Telehealth: Payer: Self-pay

## 2024-07-04 NOTE — Telephone Encounter (Signed)
 [facility] Patient Advocate Encounter  Patient's medications Cabenuva  have been couriered to [facility] from [facility] and will be administered at the patients appointment on 07/11/24.  Arland Hutchinson, CPhT Specialty Pharmacy Patient [facility] for Infectious Disease Phone: [phone] Fax:  [phone]

## 2024-07-11 ENCOUNTER — Ambulatory Visit: Admitting: Internal Medicine

## 2024-07-11 NOTE — Progress Notes (Unsigned)
 HPI: Milca Bih Mccorkel is a 33 y.o. female who presents to the [facility] pharmacy clinic for Cabenuva  administration.  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   AMITRIPTYLINE  (ELAVIL ) 25 MG TABLET    Take 1 tablet (25 mg total) by mouth at bedtime.   CABOTEGRAVIR  & RILPIVIRINE  ER (CABENUVA ) 400 & 600 MG/2ML INJECTION    Inject 1 kit into the muscle every 30 (thirty) days.   CLOTRIMAZOLE -BETAMETHASONE  (LOTRISONE ) CREAM    Apply topically 2 (two) times daily as needed.   COLCHICINE  0.6 MG TABLET    Take 1 tablet (0.6 mg total) by mouth daily.   DIPHENHYDRAMINE  (BENADRYL ) 25 MG TABLET    Take 25 mg by mouth every 6 (six) hours as needed (when taking oxycodone ).   DOCUSATE SODIUM  (COLACE) 100 MG CAPSULE    Take 1 capsule (100 mg total) by mouth daily as needed for up to 30 doses.   ENSURE (ENSURE)    Take 1 Can by mouth 2 (two) times daily between meals.   HYDROCODONE -ACETAMINOPHEN  (NORCO/VICODIN) 5-325 MG TABLET    Take 1 tablet by mouth every  6 (six) hours as needed for pain.   HYDROXYPROPYL METHYLCELLULOSE / HYPROMELLOSE (ISOPTO TEARS / GONIOVISC) 2.5 % OPHTHALMIC SOLUTION    Place 1 drop into both eyes as needed for dry eyes.   IBUPROFEN  (ADVIL ) 800 MG TABLET    Take 800 mg by mouth 2 (two) times daily as needed for moderate pain.   MELOXICAM  (MOBIC ) 15 MG TABLET    Take 1 tablet daily with food for 7 days. Then take as needed.   NALOXONE  (NARCAN ) NASAL SPRAY 4 MG/0.1 ML    Instill 1 spray every 2 minutes for opioid overdose;spray 1 dose into ONE nostril; alternate nostrils w each dose until help arrives   POLYETHYLENE GLYCOL 3350  (MIRALAX  PO)    Take 17 g by mouth daily.   TRANEXAMIC ACID  (LYSTEDA ) 650 MG TABS TABLET    Take 2 tablets (1,300 mg total) by mouth 3 (three) times daily. Take during menses for a maximum of five days   TRIAMCINOLONE  CREAM (KENALOG ) 0.1 %    SMARTSIG:1 Application Topical 2-3 Times Daily  Modified Medications   No medications on file  Discontinued Medications   No medications on file    Allergies: Allergies  Allergen Reactions   Hydrocodone  Itching and Nausea Only    Pt states she can tolerate with benadryl  and Tolerates Oxycodone    Tramadol  Itching and Nausea Only    Pt states she can  tolerate with benadryl  and Tolerates oxycodone     Labs: Lab Results  Component Value Date   HIV1RNAQUANT 31 (H) 05/04/2024   HIV1RNAQUANT 53 (H) 04/06/2024   HIV1RNAQUANT NOT DETECTED 03/11/2024   CD4TABS <35 (L) 09/28/2023   CD4TABS 126 (L) 03/02/2023   CD4TABS 115 (L) 11/27/2022    RPR and STI Lab Results  Component Value Date   LABRPR REACTIVE (A) 03/11/2024   LABRPR REACTIVE (A) 09/28/2023   LABRPR REACTIVE (A) 11/27/2022   LABRPR REACTIVE (A) 08/12/2022   LABRPR Reactive (A) 04/12/2022   RPRTITER 1:1 (H) 03/11/2024   RPRTITER 1:1 (H) 09/28/2023   RPRTITER 1:1 (H) 11/27/2022   RPRTITER 1:1 (H) 08/12/2022   RPRTITER 1:1 (H) 02/28/2022    STI Results GC CT  09/28/2023 10:51 AM Negative   Negative   03/18/2022 11:17 AM Negative  Negative   01/02/2022  3:50 PM Negative  Negative   04/14/2021 10:35 PM Negative  Negative   11/01/2020  3:18 PM Negative  Negative   08/09/2018 12:00 AM Negative  Negative   02/18/2018 12:00 AM Negative  Negative   02/12/2018 12:00 AM Negative  **POSITIVE**   11/20/2017 12:00 AM Negative  Negative   05/23/2016 12:00 AM Negative  Negative   01/23/2015 12:00 AM NG: Negative  CT: Negative     Hepatitis B Lab Results  Component Value Date   HEPBSAB POS (A) 09/13/2015   HEPBSAG NEGATIVE 03/13/2012   HEPBCAB NEGATIVE 03/13/2012   Hepatitis C Lab Results  Component Value Date   HEPCAB NON-REACTIVE 03/17/2022   Hepatitis A Lab Results  Component Value Date   HAV POSITIVE (A) 03/13/2012   Lipids: Lab Results  Component Value Date   CHOL 228 (H) 03/11/2024   TRIG 112 03/11/2024   HDL 72 03/11/2024   CHOLHDL 3.2 03/11/2024   VLDL 29 12/25/2016   LDLCALC 134 (H) 03/11/2024    TARGET DATE: The 26th  Assessment: Christabelle presents today for her maintenance Cabenuva  injections. Past injections were tolerated well without issues. Last HIV RNA was <50 in May. Doing well with no issues today. She was due for her appointment with Dr. Luiz this month but had to reschedule. Will schedule her next visit with her.  SHINGLES  Administered cabotegravir  400mg /49mL in right upper outer quadrant of the gluteal muscle. Administered rilpivirine  600 mg/2mL in the left upper outer quadrant of the gluteal muscle. No issues with injections. She will follow up in 1 month.   Plan: - Cabenuva  injections administered - Next injections scheduled for *** with Dr. Luiz and *** with *** - Call with any issues or questions  Avant Printy L. Martrell Eguia, PharmD, BCIDP, AAHIVP, CPP Clinical Pharmacist Practitioner - Infectious Diseases Clinical Pharmacist Lead - Specialty Pharmacy [facility] for [facility]

## 2024-07-12 ENCOUNTER — Other Ambulatory Visit: Payer: Self-pay

## 2024-07-12 ENCOUNTER — Ambulatory Visit: Admitting: Pharmacist

## 2024-07-12 DIAGNOSIS — B20 Human immunodeficiency virus [HIV] disease: Secondary | ICD-10-CM

## 2024-07-12 MED ORDER — CABOTEGRAVIR & RILPIVIRINE ER 400 & 600 MG/2ML IM SUER
1.0000 | Freq: Once | INTRAMUSCULAR | Status: AC
Start: 2024-07-12 — End: 2024-07-12
  Administered 2024-07-12: 1 via INTRAMUSCULAR

## 2024-07-28 ENCOUNTER — Other Ambulatory Visit: Payer: Self-pay

## 2024-07-28 ENCOUNTER — Other Ambulatory Visit (HOSPITAL_COMMUNITY): Payer: Self-pay

## 2024-07-28 NOTE — Progress Notes (Signed)
 Specialty Pharmacy Refill Coordination Note  Kelsey Rodriguez is a 33 y.o. female assessed today regarding refills of clinic administered specialty medication(s) Cabotegravir  & Rilpivirine  (Cabenuva )   Clinic requested Courier to Provider Office   Delivery date: 08/01/24   Verified address: [address] [place] [address]   Medication will be filled on 07/29/24.

## 2024-07-29 ENCOUNTER — Other Ambulatory Visit: Payer: Self-pay

## 2024-08-01 ENCOUNTER — Telehealth: Payer: Self-pay

## 2024-08-01 NOTE — Telephone Encounter (Signed)
 [facility] Patient Advocate Encounter  Patient's medications Cabenuva  have been couriered to [facility] from [facility] and will be administered at the patients appointment on 08/02/24.  Arland Hutchinson, CPhT Specialty Pharmacy Patient [facility] for Infectious Disease Phone: [phone] Fax:  [phone]

## 2024-08-01 NOTE — Progress Notes (Unsigned)
 HPI: Kelsey Rodriguez is a 33 y.o. female who presents to the [facility] pharmacy clinic for Cabenuva  administration.  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   AMITRIPTYLINE  (ELAVIL ) 25 MG TABLET    Take 1 tablet (25 mg total) by mouth at bedtime.   CABOTEGRAVIR  & RILPIVIRINE  ER (CABENUVA ) 400 & 600 MG/2ML INJECTION    Inject 1 kit into the muscle every 30 (thirty) days.   CLOTRIMAZOLE -BETAMETHASONE  (LOTRISONE ) CREAM    Apply topically 2 (two) times daily as needed.   COLCHICINE  0.6 MG TABLET    Take 1 tablet (0.6 mg total) by mouth daily.   DIPHENHYDRAMINE  (BENADRYL ) 25 MG TABLET    Take 25 mg by mouth every 6 (six) hours as needed (when taking oxycodone ).   DOCUSATE SODIUM  (COLACE) 100 MG CAPSULE    Take 1 capsule (100 mg total) by mouth daily as needed for up to 30 doses.   ENSURE (ENSURE)    Take 1 Can by mouth 2 (two) times daily between meals.   HYDROCODONE -ACETAMINOPHEN  (NORCO/VICODIN) 5-325 MG TABLET    Take 1 tablet by mouth every  6 (six) hours as needed for pain.   HYDROXYPROPYL METHYLCELLULOSE / HYPROMELLOSE (ISOPTO TEARS / GONIOVISC) 2.5 % OPHTHALMIC SOLUTION    Place 1 drop into both eyes as needed for dry eyes.   IBUPROFEN  (ADVIL ) 800 MG TABLET    Take 800 mg by mouth 2 (two) times daily as needed for moderate pain.   MELOXICAM  (MOBIC ) 15 MG TABLET    Take 1 tablet daily with food for 7 days. Then take as needed.   NALOXONE  (NARCAN ) NASAL SPRAY 4 MG/0.1 ML    Instill 1 spray every 2 minutes for opioid overdose;spray 1 dose into ONE nostril; alternate nostrils w each dose until help arrives   POLYETHYLENE GLYCOL 3350  (MIRALAX  PO)    Take 17 g by mouth daily.   TRANEXAMIC ACID  (LYSTEDA ) 650 MG TABS TABLET    Take 2 tablets (1,300 mg total) by mouth 3 (three) times daily. Take during menses for a maximum of five days   TRIAMCINOLONE  CREAM (KENALOG ) 0.1 %    SMARTSIG:1 Application Topical 2-3 Times Daily  Modified Medications   No medications on file  Discontinued Medications   No medications on file    Allergies: Allergies  Allergen Reactions   Hydrocodone  Itching and Nausea Only    Pt states she can tolerate with benadryl  and Tolerates Oxycodone    Tramadol  Itching and Nausea Only    Pt states she can  tolerate with benadryl  and Tolerates oxycodone     Labs: Lab Results  Component Value Date   HIV1RNAQUANT 31 (H) 05/04/2024   HIV1RNAQUANT 53 (H) 04/06/2024   HIV1RNAQUANT NOT DETECTED 03/11/2024   CD4TABS <35 (L) 09/28/2023   CD4TABS 126 (L) 03/02/2023   CD4TABS 115 (L) 11/27/2022    RPR and STI Lab Results  Component Value Date   LABRPR REACTIVE (A) 03/11/2024   LABRPR REACTIVE (A) 09/28/2023   LABRPR REACTIVE (A) 11/27/2022   LABRPR REACTIVE (A) 08/12/2022   LABRPR Reactive (A) 04/12/2022   RPRTITER 1:1 (H) 03/11/2024   RPRTITER 1:1 (H) 09/28/2023   RPRTITER 1:1 (H) 11/27/2022   RPRTITER 1:1 (H) 08/12/2022   RPRTITER 1:1 (H) 02/28/2022    STI Results GC CT  09/28/2023 10:51 AM Negative   Negative   03/18/2022 11:17 AM Negative  Negative   01/02/2022  3:50 PM Negative  Negative   04/14/2021 10:35 PM Negative  Negative   11/01/2020  3:18 PM Negative  Negative   08/09/2018 12:00 AM Negative  Negative   02/18/2018 12:00 AM Negative  Negative   02/12/2018 12:00 AM Negative  **POSITIVE**   11/20/2017 12:00 AM Negative  Negative   05/23/2016 12:00 AM Negative  Negative   01/23/2015 12:00 AM NG: Negative  CT: Negative     Hepatitis B Lab Results  Component Value Date   HEPBSAB POS (A) 09/13/2015   HEPBSAG NEGATIVE 03/13/2012   HEPBCAB NEGATIVE 03/13/2012   Hepatitis C Lab Results  Component Value Date   HEPCAB NON-REACTIVE 03/17/2022   Hepatitis A Lab Results  Component Value Date   HAV POSITIVE (A) 03/13/2012   Lipids: Lab Results  Component Value Date   CHOL 228 (H) 03/11/2024   TRIG 112 03/11/2024   HDL 72 03/11/2024   CHOLHDL 3.2 03/11/2024   VLDL 29 12/25/2016   LDLCALC 134 (H) 03/11/2024    TARGET DATE: 26  Assessment: *** presents today for *** maintenance Cabenuva  injections. Past injections were tolerated well without issues. Last HIV RNA was *** in ***. Doing well with no issues today.  Administered cabotegravir  600mg /54mL in left upper outer quadrant of the gluteal muscle. Administered rilpivirine  900 mg/3mL in the right upper outer quadrant of the gluteal muscle. No issues with injections. *** will follow up in 2 months for next set of injections.  Plan: - Check HIV RNA level - Cabenuva  injections administered - Next injections scheduled for *** - Call with any issues or questions  Elma Fail, PharmD PGY1 Clinical Pharmacist [facility]  08/01/2024 1:33 PM

## 2024-08-02 ENCOUNTER — Ambulatory Visit: Admitting: Pharmacist

## 2024-08-02 DIAGNOSIS — B20 Human immunodeficiency virus [HIV] disease: Secondary | ICD-10-CM

## 2024-08-02 DIAGNOSIS — Z113 Encounter for screening for infections with a predominantly sexual mode of transmission: Secondary | ICD-10-CM

## 2024-08-08 NOTE — Telephone Encounter (Signed)
 You can respond to the patient here for scheduling this week!

## 2024-08-09 ENCOUNTER — Other Ambulatory Visit: Payer: Self-pay

## 2024-08-09 ENCOUNTER — Ambulatory Visit (INDEPENDENT_AMBULATORY_CARE_PROVIDER_SITE_OTHER): Admitting: Pharmacist

## 2024-08-09 DIAGNOSIS — B20 Human immunodeficiency virus [HIV] disease: Secondary | ICD-10-CM

## 2024-08-09 DIAGNOSIS — Z23 Encounter for immunization: Secondary | ICD-10-CM | POA: Diagnosis not present

## 2024-08-09 DIAGNOSIS — Z113 Encounter for screening for infections with a predominantly sexual mode of transmission: Secondary | ICD-10-CM

## 2024-08-09 MED ORDER — CABOTEGRAVIR & RILPIVIRINE ER 400 & 600 MG/2ML IM SUER
1.0000 | Freq: Once | INTRAMUSCULAR | Status: AC
  Administered 2024-08-09: 1 via INTRAMUSCULAR

## 2024-08-09 NOTE — Progress Notes (Signed)
 HPI: Kelsey Rodriguez is a 33 y.o. female who presents to the [facility] pharmacy clinic for Cabenuva  administration.  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   AMITRIPTYLINE  (ELAVIL ) 25 MG TABLET    Take 1 tablet (25 mg total) by mouth at bedtime.   CABOTEGRAVIR  & RILPIVIRINE  ER (CABENUVA ) 400 & 600 MG/2ML INJECTION    Inject 1 kit into the muscle every 30 (thirty) days.   CLOTRIMAZOLE -BETAMETHASONE  (LOTRISONE ) CREAM    Apply topically 2 (two) times daily as needed.   COLCHICINE  0.6 MG TABLET    Take 1 tablet (0.6 mg total) by mouth daily.   DIPHENHYDRAMINE  (BENADRYL ) 25 MG TABLET    Take 25 mg by mouth every 6 (six) hours as needed (when taking oxycodone ).   DOCUSATE SODIUM  (COLACE) 100 MG CAPSULE    Take 1 capsule (100 mg total) by mouth daily as needed for up to 30 doses.   ENSURE (ENSURE)    Take 1 Can by mouth 2 (two) times daily between meals.   HYDROCODONE -ACETAMINOPHEN  (NORCO/VICODIN) 5-325 MG TABLET    Take 1 tablet by mouth every  6 (six) hours as needed for pain.   HYDROXYPROPYL METHYLCELLULOSE / HYPROMELLOSE (ISOPTO TEARS / GONIOVISC) 2.5 % OPHTHALMIC SOLUTION    Place 1 drop into both eyes as needed for dry eyes.   IBUPROFEN  (ADVIL ) 800 MG TABLET    Take 800 mg by mouth 2 (two) times daily as needed for moderate pain.   MELOXICAM  (MOBIC ) 15 MG TABLET    Take 1 tablet daily with food for 7 days. Then take as needed.   NALOXONE  (NARCAN ) NASAL SPRAY 4 MG/0.1 ML    Instill 1 spray every 2 minutes for opioid overdose;spray 1 dose into ONE nostril; alternate nostrils w each dose until help arrives   POLYETHYLENE GLYCOL 3350  (MIRALAX  PO)    Take 17 g by mouth daily.   TRANEXAMIC ACID  (LYSTEDA ) 650 MG TABS TABLET    Take 2 tablets (1,300 mg total) by mouth 3 (three) times daily. Take during menses for a maximum of five days   TRIAMCINOLONE  CREAM (KENALOG ) 0.1 %    SMARTSIG:1 Application Topical 2-3 Times Daily  Modified Medications   No medications on file  Discontinued Medications   No medications on file    Allergies: Allergies  Allergen Reactions   Hydrocodone  Itching and Nausea Only    Pt states she can tolerate with benadryl  and Tolerates Oxycodone    Tramadol  Itching and Nausea Only    Pt states she can  tolerate with benadryl  and Tolerates oxycodone     Labs: Lab Results  Component Value Date   HIV1RNAQUANT 31 (H) 05/04/2024   HIV1RNAQUANT 53 (H) 04/06/2024   HIV1RNAQUANT NOT DETECTED 03/11/2024   CD4TABS <35 (L) 09/28/2023   CD4TABS 126 (L) 03/02/2023   CD4TABS 115 (L) 11/27/2022    RPR and STI Lab Results  Component Value Date   LABRPR REACTIVE (A) 03/11/2024   LABRPR REACTIVE (A) 09/28/2023   LABRPR REACTIVE (A) 11/27/2022   LABRPR REACTIVE (A) 08/12/2022   LABRPR Reactive (A) 04/12/2022   RPRTITER 1:1 (H) 03/11/2024   RPRTITER 1:1 (H) 09/28/2023   RPRTITER 1:1 (H) 11/27/2022   RPRTITER 1:1 (H) 08/12/2022   RPRTITER 1:1 (H) 02/28/2022    STI Results GC CT  09/28/2023 10:51 AM Negative   Negative   03/18/2022 11:17 AM Negative  Negative   01/02/2022  3:50 PM Negative  Negative   04/14/2021 10:35 PM Negative  Negative   11/01/2020  3:18 PM Negative  Negative   08/09/2018 12:00 AM Negative  Negative   02/18/2018 12:00 AM Negative  Negative   02/12/2018 12:00 AM Negative  **POSITIVE**   11/20/2017 12:00 AM Negative  Negative   05/23/2016 12:00 AM Negative  Negative   01/23/2015 12:00 AM NG: Negative  CT: Negative     Hepatitis B Lab Results  Component Value Date   HEPBSAB POS (A) 09/13/2015   HEPBSAG NEGATIVE 03/13/2012   HEPBCAB NEGATIVE 03/13/2012   Hepatitis C Lab Results  Component Value Date   HEPCAB NON-REACTIVE 03/17/2022   Hepatitis A Lab Results  Component Value Date   HAV POSITIVE (A) 03/13/2012   Lipids: Lab Results  Component Value Date   CHOL 228 (H) 03/11/2024   TRIG 112 03/11/2024   HDL 72 03/11/2024   CHOLHDL 3.2 03/11/2024   VLDL 29 12/25/2016   LDLCALC 134 (H) 03/11/2024    TARGET DATE: 26  Assessment: Kelsey Rodriguez presents today for her maintenance Cabenuva  injections. Past injections were tolerated well without issues. Last HIV RNA was 31 in 05/04/24. Last CD4 was 232 on 02/2024. Will repeat HIV RNA and CD4 today. Doing well with no issues. Defers STI testing. She is due for her second shingles vaccine; will administer today.   Administered cabotegravir  400mg /86mL in left upper outer quadrant of the gluteal muscle. Administered rilpivirine  600 mg/2mL in the right upper outer quadrant of the gluteal muscle. No issues with injections. She will follow up in 1 month for next set of injections.  Plan: - Administer 2/2 Shingles vaccine - Cabenuva  injections administered - HIV RNA - CD4 today - Next injections scheduled for 09/13/24 - Call with any issues or questions  Elma Fail, PharmD PGY1 Clinical Pharmacist [facility]  08/09/2024 11:05 AM

## 2024-08-10 LAB — T-HELPER CELLS (CD4) COUNT (NOT AT ARMC)
CD4 % Helper T Cell: 16 % — ABNORMAL LOW (ref 33–65)
CD4 T Cell Abs: 344 /uL — ABNORMAL LOW (ref 400–1790)

## 2024-08-11 LAB — HIV-1 RNA QUANT-NO REFLEX-BLD
HIV 1 RNA Quant: 21 {copies}/mL — ABNORMAL HIGH
HIV-1 RNA Quant, Log: 1.32 {Log_copies}/mL — ABNORMAL HIGH

## 2024-09-02 ENCOUNTER — Other Ambulatory Visit: Payer: Self-pay

## 2024-09-02 ENCOUNTER — Other Ambulatory Visit (HOSPITAL_COMMUNITY): Payer: Self-pay

## 2024-09-02 NOTE — Progress Notes (Signed)
 Specialty Pharmacy Refill Coordination Note  Kelsey Rodriguez is a 33 y.o. female assessed today regarding refills of clinic administered specialty medication(s) Cabotegravir  & Rilpivirine  (Cabenuva )   Clinic requested Courier to Provider Office   Delivery date: 09/08/24   Verified address: [address] [place] [address]   Medication will be filled on 09/07/24.

## 2024-09-08 ENCOUNTER — Telehealth: Payer: Self-pay

## 2024-09-08 NOTE — Telephone Encounter (Signed)
 [facility] Patient Advocate Encounter  Patient's medications CABENUVA  have been couriered to [facility] from [facility] and will be administered at the patients appointment on 09/13/24.  Charmaine Sharps, CPhT Specialty Pharmacy Patient [facility] for Infectious Disease Phone: [phone] Fax:  [phone]

## 2024-09-12 NOTE — Progress Notes (Unsigned)
 HPI: Kelsey Rodriguez is a 33 y.o. female who presents to the [facility] pharmacy clinic for Cabenuva  administration.  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   AMITRIPTYLINE  (ELAVIL ) 25 MG TABLET    Take 1 tablet (25 mg total) by mouth at bedtime.   CABOTEGRAVIR  & RILPIVIRINE  ER (CABENUVA ) 400 & 600 MG/2ML INJECTION    Inject 1 kit into the muscle every 30 (thirty) days.   CLOTRIMAZOLE -BETAMETHASONE  (LOTRISONE ) CREAM    Apply topically 2 (two) times daily as needed.   COLCHICINE  0.6 MG TABLET    Take 1 tablet (0.6 mg total) by mouth daily.   DIPHENHYDRAMINE  (BENADRYL ) 25 MG TABLET    Take 25 mg by mouth every 6 (six) hours as needed (when taking oxycodone ).   DOCUSATE SODIUM  (COLACE) 100 MG CAPSULE    Take 1 capsule (100 mg total) by mouth daily as needed for up to 30 doses.   ENSURE (ENSURE)    Take 1 Can by mouth 2 (two) times daily between meals.   HYDROCODONE -ACETAMINOPHEN  (NORCO/VICODIN) 5-325 MG TABLET    Take 1 tablet by mouth every  6 (six) hours as needed for pain.   HYDROXYPROPYL METHYLCELLULOSE / HYPROMELLOSE (ISOPTO TEARS / GONIOVISC) 2.5 % OPHTHALMIC SOLUTION    Place 1 drop into both eyes as needed for dry eyes.   IBUPROFEN  (ADVIL ) 800 MG TABLET    Take 800 mg by mouth 2 (two) times daily as needed for moderate pain.   MELOXICAM  (MOBIC ) 15 MG TABLET    Take 1 tablet daily with food for 7 days. Then take as needed.   NALOXONE  (NARCAN ) NASAL SPRAY 4 MG/0.1 ML    Instill 1 spray every 2 minutes for opioid overdose;spray 1 dose into ONE nostril; alternate nostrils w each dose until help arrives   POLYETHYLENE GLYCOL 3350  (MIRALAX  PO)    Take 17 g by mouth daily.   TRANEXAMIC ACID  (LYSTEDA ) 650 MG TABS TABLET    Take 2 tablets (1,300 mg total) by mouth 3 (three) times daily. Take during menses for a maximum of five days   TRIAMCINOLONE  CREAM (KENALOG ) 0.1 %    SMARTSIG:1 Application Topical 2-3 Times Daily  Modified Medications   No medications on file  Discontinued Medications   No medications on file    Allergies: Allergies  Allergen Reactions   Hydrocodone  Itching and Nausea Only    Pt states she can tolerate with benadryl  and Tolerates Oxycodone    Tramadol  Itching and Nausea Only    Pt states she can  tolerate with benadryl  and Tolerates oxycodone     Labs: Lab Results  Component Value Date   HIV1RNAQUANT 21 (H) 08/09/2024   HIV1RNAQUANT 31 (H) 05/04/2024   HIV1RNAQUANT 53 (H) 04/06/2024   CD4TABS 344 (L) 08/09/2024   CD4TABS <35 (L) 09/28/2023   CD4TABS 126 (L) 03/02/2023    RPR and STI Lab Results  Component Value Date   LABRPR REACTIVE (A) 03/11/2024   LABRPR REACTIVE (A) 09/28/2023   LABRPR REACTIVE (A) 11/27/2022   LABRPR REACTIVE (A) 08/12/2022   LABRPR Reactive (A) 04/12/2022   RPRTITER 1:1 (H) 03/11/2024   RPRTITER 1:1 (H) 09/28/2023   RPRTITER 1:1 (H) 11/27/2022   RPRTITER 1:1 (H) 08/12/2022   RPRTITER 1:1 (H) 02/28/2022    STI Results GC CT  09/28/2023 10:51 AM Negative  Negative    03/18/2022 11:17 AM Negative  Negative   01/02/2022  3:50 PM Negative  Negative   04/14/2021 10:35 PM Negative  Negative   11/01/2020  3:18 PM Negative  Negative   08/09/2018 12:00 AM Negative  Negative   02/18/2018 12:00 AM Negative  Negative   02/12/2018 12:00 AM Negative  **POSITIVE**   11/20/2017 12:00 AM Negative  Negative   05/23/2016 12:00 AM Negative  Negative   01/23/2015 12:00 AM NG: Negative  CT: Negative     Hepatitis B Lab Results  Component Value Date   HEPBSAB POS (A) 09/13/2015   HEPBSAG NEGATIVE 03/13/2012   HEPBCAB NEGATIVE 03/13/2012   Hepatitis C Lab Results  Component Value Date   HEPCAB NON-REACTIVE 03/17/2022   Hepatitis A Lab Results  Component Value Date   HAV POSITIVE (A) 03/13/2012   Lipids: Lab Results  Component Value Date   CHOL 228 (H) 03/11/2024   TRIG 112 03/11/2024   HDL 72 03/11/2024   CHOLHDL 3.2 03/11/2024   VLDL 29 12/25/2016   LDLCALC 134 (H) 03/11/2024    TARGET DATE: The 26th  Assessment: Kelsey Rodriguez presents today for her maintenance Cabenuva  injections. Past injections were tolerated well without issues. She reports the soreness is improving. Last HIV RNA was undetectable in August. Doing well with no issues today.  Administered cabotegravir  400mg /41mL in right upper outer quadrant of the gluteal muscle. Administered rilpivirine  600 mg/2mL in the left upper outer quadrant of the gluteal muscle. No issues with injections. She will follow up in 2 months for next set of injections.  She has a history of Toxoplasmosis infection twice in 2020 and 2023. She has been on Bactrim  in the past for secondary prophylaxis but is not currently taking it. The goal is to have a CD4 >200 for at least 6 months in order to stop prophylaxis. She meets this goal (08/09/24 CD4 344, 03/11/24 CD4 232 and 02/03/24 CD4 214). 2/3 of recent CD4 counts have been hovering slightly above 200, so will monitor closely and low threshold for adding Bactrim  back.  She  inferred about switching to every two month injections. She is able to switch once she is consistently undetectable. Recent HIV RNAs have been 21 (08/09/24), 31 (05/04/24) and 53 (04/06/24). Would like to see her undetectable for 6 months. As she has only been undetectable for 4 months, can consider switching in November if still undetectable. She would get cabotegravir  600 mg/rilpivirine  900 mg x1 one month after the previous maintenance injection, then proceed with every 2 month injections thereafter. She may be going home to [place] in [place] for 4-6 weeks, so reports the every 2 month injections would be helpful around  this time. She is scheduled for 12/02/24 and 01/03/25 but will call to adjust if needed.   Noted eligibility for the flu vaccine. Due to no stock remaining currently did not address at this visit. Will discuss at October visit.  Plan: - Cabenuva  injections administered - Next injections scheduled for 10/03/24 with Dr Luiz, 11/07/24 with Cassie, 12/02/24 with Alan and 01/03/25 with Alan - Call with any issues or questions  Izetta Carl, PharmD PGY1 Pharmacy Resident

## 2024-09-13 ENCOUNTER — Ambulatory Visit: Admitting: Pharmacist

## 2024-09-13 ENCOUNTER — Other Ambulatory Visit: Payer: Self-pay

## 2024-09-13 DIAGNOSIS — B20 Human immunodeficiency virus [HIV] disease: Secondary | ICD-10-CM | POA: Diagnosis not present

## 2024-09-13 DIAGNOSIS — Z23 Encounter for immunization: Secondary | ICD-10-CM

## 2024-09-13 MED ORDER — CABOTEGRAVIR & RILPIVIRINE ER 400 & 600 MG/2ML IM SUER
1.0000 | Freq: Once | INTRAMUSCULAR | Status: AC
  Administered 2024-09-13: 1 via INTRAMUSCULAR

## 2024-09-23 ENCOUNTER — Other Ambulatory Visit: Payer: Self-pay

## 2024-09-23 ENCOUNTER — Other Ambulatory Visit (HOSPITAL_COMMUNITY): Payer: Self-pay

## 2024-09-23 NOTE — Progress Notes (Signed)
 Specialty Pharmacy Refill Coordination Note  Kelsey Rodriguez is a 33 y.o. female assessed today regarding refills of clinic administered specialty medication(s) Cabotegravir  & Rilpivirine  (Cabenuva )   Clinic requested Courier to Provider Office   Delivery date: 09/29/24   Verified address: [address] [employer] [place] [address]   Medication will be filled on 09/28/24.

## 2024-09-28 ENCOUNTER — Other Ambulatory Visit: Payer: Self-pay

## 2024-09-30 ENCOUNTER — Other Ambulatory Visit: Payer: Self-pay

## 2024-10-03 ENCOUNTER — Other Ambulatory Visit: Payer: Self-pay

## 2024-10-03 ENCOUNTER — Telehealth: Payer: Self-pay

## 2024-10-03 ENCOUNTER — Encounter: Payer: Self-pay | Admitting: Internal Medicine

## 2024-10-03 ENCOUNTER — Ambulatory Visit: Payer: Self-pay | Admitting: Internal Medicine

## 2024-10-03 VITALS — BP 119/82 | HR 76 | Temp 98.2°F | Resp 16 | Wt 203.0 lb

## 2024-10-03 DIAGNOSIS — Z79899 Other long term (current) drug therapy: Secondary | ICD-10-CM

## 2024-10-03 DIAGNOSIS — R519 Headache, unspecified: Secondary | ICD-10-CM

## 2024-10-03 DIAGNOSIS — B20 Human immunodeficiency virus [HIV] disease: Secondary | ICD-10-CM | POA: Diagnosis not present

## 2024-10-03 MED ORDER — CABOTEGRAVIR & RILPIVIRINE ER 400 & 600 MG/2ML IM SUER
1.0000 | Freq: Once | INTRAMUSCULAR | Status: AC
  Administered 2024-10-03: 1 via INTRAMUSCULAR

## 2024-10-03 NOTE — Telephone Encounter (Signed)
 [facility] Patient Advocate Encounter  Patient's medications Cabenuva  have been couriered to [facility] from [facility] and will be administered at the patients appointment on 10/03/24.  Arland Hutchinson, CPhT Specialty Pharmacy Patient [facility] for Infectious Disease Phone: [phone] Fax:  [phone]

## 2024-10-03 NOTE — Progress Notes (Signed)
 Patient ID: Kelsey Rodriguez, female   DOB: 12-Jun-1991, 33 y.o.   MRN: [id number]  HPI  33yo F with HIV disease, CD 4 count of 344/VL<20, while on cabenuva .  She reports Having headaches, occasionally due to stress. She states that she notices when she stands up mostly, unable to give much more information. Other health related issues - Also has iud, no abdominal discomfort Outpatient Encounter Medications as of 10/03/2024  Medication Sig   amitriptyline  (ELAVIL ) 25 MG tablet Take 1 tablet (25 mg total) by mouth at bedtime.   cabotegravir  & rilpivirine  ER (CABENUVA ) 400 & 600 MG/2ML injection Inject 1 kit into the muscle every 30 (thirty) days.   clotrimazole -betamethasone  (LOTRISONE ) cream Apply topically 2 (two) times daily as needed.   diphenhydrAMINE  (BENADRYL ) 25 MG tablet Take 25 mg by mouth every 6 (six) hours as needed (when taking oxycodone ).   docusate sodium  (COLACE) 100 MG capsule Take 1 capsule (100 mg total) by mouth daily as needed for up to 30 doses.   HYDROcodone -acetaminophen  (NORCO/VICODIN) 5-325 MG tablet Take 1 tablet by mouth every 6 (six) hours as needed for pain.   hydroxypropyl methylcellulose / hypromellose (ISOPTO TEARS / GONIOVISC) 2.5 % ophthalmic solution Place 1 drop into both eyes as needed for dry eyes.   ibuprofen  (ADVIL ) 800 MG tablet Take 800 mg by mouth 2 (two) times daily as needed for moderate pain.   IUD'S IU by Intrauterine route.   meloxicam  (MOBIC ) 15 MG tablet Take 1 tablet daily with food for 7 days. Then take as needed.   naloxone  (NARCAN ) nasal spray 4 mg/0.1 mL Instill 1 spray every 2 minutes for opioid overdose;spray 1 dose into ONE nostril; alternate nostrils w each dose until help arrives   Polyethylene Glycol 3350  (MIRALAX  PO) Take 17 g by mouth daily.   triamcinolone  cream (KENALOG ) 0.1 % SMARTSIG:1 Application Topical 2-3 Times Daily   Vitamin D , Ergocalciferol , (DRISDOL) 1.25 MG (50000 UNIT) CAPS capsule Take 50,000 Units by mouth  every 7 (seven) days.   amitriptyline  (ELAVIL ) 10 MG tablet Take 1 tablet (10 mg total) by mouth at bedtime as needed for sleep (headaches).   colchicine  0.6 MG tablet Take 1 tablet (0.6 mg total) by mouth daily. (Patient not taking: Reported on 10/03/2024)   Ensure (ENSURE) Take 1 Can by mouth 2 (two) times daily between meals.   tranexamic acid  (LYSTEDA ) 650 MG TABS tablet Take 2 tablets (1,300 mg total) by mouth 3 (three) times daily. Take during menses for a maximum of five days (Patient not taking: Reported on 10/03/2024)   No facility-administered encounter medications on file as of 10/03/2024.     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 05/09/2020   Hypertension    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HCC)    Seizure (HCC)    Toxoplasmosis 11/07/2019   AIDS (acquired immune deficiency syndrome) (HCC) 11/07/2019   Chronic pelvic pain in female 01/04/2019   Tuberculosis    Complex regional pain syndrome 02/03/2017   Headache 10/28/2016   Primary adrenal insufficiency (HCC) 01/03/2015   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 appears well-developed and  well-nourished. No distress.  HENT: /AT, PERRLA, no scleral icterus Mouth/Throat: Oropharynx is clear and moist. No oropharyngeal exudate.  Cardiovascular: Normal rate, regular rhythm and normal heart sounds. Exam reveals no gallop and no friction rub.  No murmur heard.  Pulmonary/Chest: Effort normal and breath sounds normal. No respiratory distress.  has no wheezes.  Neck = supple, no nuchal rigidity Lymphadenopathy: no cervical adenopathy. No axillary adenopathy Neurological: alert and oriented to person, place, and time.  Skin: Skin is warm and dry. No rash noted. No erythema.  Psychiatric: a normal mood and affect.  behavior is normal.   Lab Results  Component Value Date   CD4TCELL 16 (L) 08/09/2024   Lab Results  Component Value Date   CD4TABS 344 (L) 08/09/2024   CD4TABS <35 (L) 09/28/2023   CD4TABS 126 (L) 03/02/2023   Lab Results  Component Value Date   HIV1RNAQUANT 21 (H) 08/09/2024   Lab Results  Component Value Date   HEPBSAB POS (A) 09/13/2015   Lab Results  Component Value Date   LABRPR REACTIVE (A) 03/11/2024    CBC Lab Results  Component Value Date   WBC 5.7 03/11/2024   RBC 4.22 03/11/2024   HGB 13.1 03/11/2024   HCT 38.9 03/11/2024   PLT 312 03/11/2024   MCV 92.2 03/11/2024   MCH 31.0 03/11/2024   MCHC 33.7 03/11/2024   RDW 14.3 03/11/2024   LYMPHSABS 2.1 02/19/2023   MONOABS 0.5 02/19/2023   EOSABS 291 03/11/2024    BMET Lab Results  Component Value Date   NA 138 03/11/2024   K 3.8 03/11/2024   CL 103 03/11/2024   CO2 26 03/11/2024   GLUCOSE 77 03/11/2024   BUN 11 03/11/2024   CREATININE 0.88 03/11/2024   CALCIUM  9.6 03/11/2024   GFRNONAA >60 02/19/2023   GFRAA >60 08/30/2020      Assessment and Plan HIV disease= finally undetectable since being switched to injectables. Will plan to continue with cabenuva  - Wil get cabenuva  injection today Plan to get cd 4 count and VL  Long term medication = reviewed that she is not  taking any new medication that would cause drug interaction with her HIV regimen  Health maintenance=  Already had flu vaccine  Postural like headache/occipital headache = refer to neurology. Currently on pain management, opiates that are not helping which she takes for back pain.

## 2024-10-03 NOTE — Progress Notes (Signed)
 Cabotegravir  400 mg/2mL administered in LUOQ Rilpivirine  600 mg/2 mL administered in RUOQ  Tra Wilemon P Reuel Lamadrid, CMA

## 2024-10-04 LAB — T-HELPER CELL (CD4) - (RCID CLINIC ONLY)
CD4 % Helper T Cell: 17 % — ABNORMAL LOW (ref 33–65)
CD4 T Cell Abs: 405 /uL (ref 400–1790)

## 2024-10-06 LAB — HIV-1 RNA QUANT-NO REFLEX-BLD
HIV 1 RNA Quant: NOT DETECTED {copies}/mL
HIV-1 RNA Quant, Log: NOT DETECTED {Log_copies}/mL

## 2024-10-17 ENCOUNTER — Encounter: Payer: Self-pay | Admitting: Radiology

## 2024-10-18 ENCOUNTER — Encounter: Payer: Self-pay | Admitting: Neurology

## 2024-10-24 ENCOUNTER — Other Ambulatory Visit: Payer: Self-pay

## 2024-10-24 ENCOUNTER — Other Ambulatory Visit (HOSPITAL_COMMUNITY): Payer: Self-pay

## 2024-10-24 NOTE — Progress Notes (Signed)
 Specialty Pharmacy Refill Coordination Note  Kelsey Rodriguez is a 33 y.o. female assessed today regarding refills of clinic administered specialty medication(s) Cabotegravir  & Rilpivirine  (Cabenuva )   Clinic requested Courier to Provider Office   Delivery date: 11/03/24   Verified address: [address] [place] [address]   Medication will be filled on 11/02/24.

## 2024-11-02 ENCOUNTER — Other Ambulatory Visit: Payer: Self-pay

## 2024-11-03 ENCOUNTER — Telehealth: Payer: Self-pay

## 2024-11-03 NOTE — Telephone Encounter (Signed)
 [facility] Patient Advocate Encounter  Patient's medications CABENUVA  have been couriered to [facility] from [facility] and will be administered at the patients appointment on 11/07/24.  Charmaine Sharps, CPhT Specialty Pharmacy Patient [facility] for Infectious Disease Phone: [phone] Fax:  [phone]

## 2024-11-06 NOTE — Progress Notes (Unsigned)
 HPI: Quaniyah Bih Albritton is a 33 y.o. female who presents to the [facility] pharmacy clinic for Cabenuva  administration.  Referring ID Provider: Dr. Luiz  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 05/09/2020   Hypertension    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HCC)    Seizure (HCC)    Toxoplasmosis 11/07/2019   AIDS (acquired immune deficiency syndrome) (HCC) 11/07/2019   Chronic pelvic pain in female 01/04/2019   Tuberculosis    Complex regional pain syndrome 02/03/2017   Headache 10/28/2016   Primary adrenal insufficiency (HCC) 01/03/2015   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   AMITRIPTYLINE  (ELAVIL ) 25 MG TABLET    Take 1 tablet (25 mg total) by mouth at bedtime.   CABOTEGRAVIR  & RILPIVIRINE  ER (CABENUVA ) 400 & 600 MG/2ML INJECTION    Inject 1 kit into the muscle every 30 (thirty) days.   CLOTRIMAZOLE -BETAMETHASONE  (LOTRISONE ) CREAM    Apply topically 2 (two) times daily as needed.   COLCHICINE  0.6 MG TABLET    Take 1 tablet (0.6 mg total) by mouth daily.   DIPHENHYDRAMINE  (BENADRYL ) 25 MG TABLET    Take 25 mg by mouth every 6 (six) hours as needed (when taking oxycodone ).   DOCUSATE SODIUM  (COLACE) 100 MG CAPSULE    Take 1 capsule (100 mg total) by mouth daily as needed for up to 30 doses.   ENSURE (ENSURE)    Take 1 Can by mouth 2 (two) times daily between meals.   HYDROCODONE -ACETAMINOPHEN  (NORCO/VICODIN) 5-325 MG  TABLET    Take 1 tablet by mouth every 6 (six) hours as needed for pain.   HYDROXYPROPYL METHYLCELLULOSE / HYPROMELLOSE (ISOPTO TEARS / GONIOVISC) 2.5 % OPHTHALMIC SOLUTION    Place 1 drop into both eyes as needed for dry eyes.   IBUPROFEN  (ADVIL ) 800 MG TABLET    Take 800 mg by mouth 2 (two) times daily as needed for moderate pain.   IUD'S IU    by Intrauterine route.   MELOXICAM  (MOBIC ) 15 MG TABLET    Take 1 tablet daily with food for 7 days. Then take as needed.   NALOXONE  (NARCAN ) NASAL SPRAY 4 MG/0.1 ML    Instill 1 spray every 2 minutes for opioid overdose;spray 1 dose into ONE nostril; alternate nostrils w each dose until help arrives   POLYETHYLENE GLYCOL 3350  (MIRALAX  PO)    Take 17 g by mouth daily.   TRANEXAMIC ACID  (LYSTEDA ) 650 MG TABS TABLET    Take 2 tablets (1,300 mg total) by mouth 3 (three) times daily. Take during menses for a maximum of five days   TRIAMCINOLONE  CREAM (KENALOG ) 0.1 %    SMARTSIG:1 Application Topical 2-3 Times Daily   VITAMIN D , ERGOCALCIFEROL , (DRISDOL) 1.25 MG (50000 UNIT) CAPS CAPSULE    Take 50,000 Units by mouth every 7 (seven) days.  Modified Medications   No medications on file  Discontinued Medications   No medications on file  Allergies: Allergies  Allergen Reactions   Hydrocodone  Itching and Nausea Only    Pt states she can tolerate with benadryl  and Tolerates Oxycodone    Tramadol  Itching and Nausea Only    Pt states she can tolerate with benadryl  and Tolerates oxycodone     Labs: Lab Results  Component Value Date   HIV1RNAQUANT NOT DETECTED 10/03/2024   HIV1RNAQUANT 21 (H) 08/09/2024   HIV1RNAQUANT 31 (H) 05/04/2024   CD4TABS 405 10/03/2024   CD4TABS 344 (L) 08/09/2024   CD4TABS <35 (L) 09/28/2023    RPR and STI Lab Results  Component Value Date   LABRPR REACTIVE (A) 03/11/2024   LABRPR REACTIVE (A) 09/28/2023   LABRPR REACTIVE (A) 11/27/2022   LABRPR REACTIVE (A) 08/12/2022   LABRPR Reactive (A) 04/12/2022   RPRTITER 1:1  (H) 03/11/2024   RPRTITER 1:1 (H) 09/28/2023   RPRTITER 1:1 (H) 11/27/2022   RPRTITER 1:1 (H) 08/12/2022   RPRTITER 1:1 (H) 02/28/2022    STI Results GC CT  09/28/2023 10:51 AM Negative  Negative   03/18/2022 11:17 AM Negative  Negative   01/02/2022  3:50 PM Negative  Negative   04/14/2021 10:35 PM Negative  Negative   11/01/2020  3:18 PM Negative  Negative   08/09/2018 12:00 AM Negative  Negative   02/18/2018 12:00 AM Negative  Negative   02/12/2018 12:00 AM Negative  **POSITIVE**   11/20/2017 12:00 AM Negative  Negative   05/23/2016 12:00 AM Negative  Negative   01/23/2015 12:00 AM NG: Negative  CT: Negative     Hepatitis B Lab Results  Component Value Date   HEPBSAB POS (A) 09/13/2015   HEPBSAG NEGATIVE 03/13/2012   HEPBCAB NEGATIVE 03/13/2012   Hepatitis C Lab Results  Component Value Date   HEPCAB NON-REACTIVE 03/17/2022   Hepatitis A Lab Results  Component Value Date   HAV POSITIVE (A) 03/13/2012   Lipids: Lab Results  Component Value Date   CHOL 228 (H) 03/11/2024   TRIG 112 03/11/2024   HDL 72 03/11/2024   CHOLHDL 3.2 03/11/2024   VLDL 29 12/25/2016   LDLCALC 134 (H) 03/11/2024    Target Date: 26th  Assessment: Johnnay presents today for her maintenance Cabenuva  injections. Past injections were tolerated well without issues. Last HIV RNA was undetectable on 10/03/2024. Doing well with no issues today.  Lab work:  ***oral/urine/rectal cytologies for [employer], RPR *** (hasn't been tested since 2024)  Eligible vaccinations: Influenza (said already received during last visit?), Covid  ***  Cabenuva : Administered cabotegravir  400mg /42mL in left upper outer quadrant of the gluteal muscle. Administered rilpivirine  600 mg/2mL in the right upper outer quadrant of the gluteal muscle. No issues with injections. Jaira will follow up in 1 months for next set of injections.  Plan: - Cabenuva  injections administered - Next injections scheduled for 12/02/2024  and 01/03/2025 with Alan - Call with any issues or questions  Feliciano Close, PharmD PGY2 Infectious Diseases Pharmacy Resident

## 2024-11-07 ENCOUNTER — Ambulatory Visit (INDEPENDENT_AMBULATORY_CARE_PROVIDER_SITE_OTHER): Admitting: Pharmacist

## 2024-11-07 ENCOUNTER — Other Ambulatory Visit: Payer: Self-pay

## 2024-11-07 DIAGNOSIS — B20 Human immunodeficiency virus [HIV] disease: Secondary | ICD-10-CM

## 2024-11-07 DIAGNOSIS — Z113 Encounter for screening for infections with a predominantly sexual mode of transmission: Secondary | ICD-10-CM

## 2024-11-07 MED ORDER — CABOTEGRAVIR & RILPIVIRINE ER 400 & 600 MG/2ML IM SUER
1.0000 | Freq: Once | INTRAMUSCULAR | Status: AC
  Administered 2024-11-07: 1 via INTRAMUSCULAR

## 2024-11-07 MED ORDER — CABENUVA 600 & 900 MG/3ML IM SUER
1.0000 | INTRAMUSCULAR | 5 refills | Status: AC
  Filled 2024-11-07: qty 6, 60d supply, fill #0
  Filled 2025-01-16: qty 6, 60d supply, fill #1

## 2024-11-07 NOTE — Progress Notes (Signed)
 Specialty Pharmacy Refill Coordination Note  Kelsey Rodriguez is a 33 y.o. female assessed today regarding refills of clinic administered specialty medication(s) Cabotegravir  & Rilpivirine  (Cabenuva )   Clinic requested Courier to Provider Office   Delivery date: 11/24/24   Verified address: [address] [employer] [address] [place] [address]   Medication will be filled on 11/23/24.  Patient switched to every other month

## 2024-11-23 ENCOUNTER — Other Ambulatory Visit: Payer: Self-pay

## 2024-11-24 ENCOUNTER — Telehealth: Payer: Self-pay

## 2024-11-24 NOTE — Telephone Encounter (Signed)
 [facility] Patient Advocate Encounter  Patient's medications CABENUVA  have been couriered to [facility] from [facility] and will be administered at the patients appointment on 12/02/24.  Charmaine Sharps, CPhT Specialty Pharmacy Patient [facility] for Infectious Disease Phone: [phone] Fax:  [phone]

## 2024-11-28 NOTE — Progress Notes (Deleted)
 HPI: Kelsey Rodriguez is a 33 y.o. female who presents to the [facility] pharmacy clinic for Cabenuva  administration.  Referring ID Physician: Dr. Luiz  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 05/09/2020   Hypertension    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HCC)    Seizure (HCC)    Toxoplasmosis 11/07/2019   AIDS (acquired immune deficiency syndrome) (HCC) 11/07/2019   Chronic pelvic pain in female 01/04/2019   Tuberculosis    Complex regional pain syndrome 02/03/2017   Headache 10/28/2016   Primary adrenal insufficiency (HCC) 01/03/2015   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   AMITRIPTYLINE  (ELAVIL ) 25 MG TABLET    Take 1 tablet (25 mg total) by mouth at bedtime.   CABOTEGRAVIR  & RILPIVIRINE  ER (CABENUVA ) 600 & 900 MG/3ML INJECTION    Inject 1 kit into the muscle every 2 (two) months.   CLOTRIMAZOLE -BETAMETHASONE (LOTRISONE) CREAM    Apply topically 2 (two) times daily as needed.   COLCHICINE  0.6 MG TABLET    Take 1 tablet (0.6 mg total) by mouth daily.   DIPHENHYDRAMINE  (BENADRYL ) 25 MG TABLET    Take 25 mg by mouth every 6 (six) hours as needed (when taking oxycodone ).   DOCUSATE SODIUM  (COLACE) 100 MG CAPSULE    Take 1 capsule (100 mg total) by mouth daily as needed for up to 30 doses.   ENSURE (ENSURE)    Take 1 Can by mouth 2 (two) times daily between meals.   HYDROCODONE -ACETAMINOPHEN  (NORCO/VICODIN) 5-325 MG  TABLET    Take 1 tablet by mouth every 6 (six) hours as needed for pain.   HYDROXYPROPYL METHYLCELLULOSE / HYPROMELLOSE (ISOPTO TEARS / GONIOVISC) 2.5 % OPHTHALMIC SOLUTION    Place 1 drop into both eyes as needed for dry eyes.   IBUPROFEN  (ADVIL ) 800 MG TABLET    Take 800 mg by mouth 2 (two) times daily as needed for moderate pain.   IUD'S IU    by Intrauterine route.   MELOXICAM  (MOBIC ) 15 MG TABLET    Take 1 tablet daily with food for 7 days. Then take as needed.   NALOXONE  (NARCAN ) NASAL SPRAY 4 MG/0.1 ML    Instill 1 spray every 2 minutes for opioid overdose;spray 1 dose into ONE nostril; alternate nostrils w each dose until help arrives   POLYETHYLENE GLYCOL 3350  (MIRALAX  PO)    Take 17 g by mouth daily.   TRANEXAMIC ACID  (LYSTEDA ) 650 MG TABS TABLET    Take 2 tablets (1,300 mg total) by mouth 3 (three) times daily. Take during menses for a maximum of five days   TRIAMCINOLONE  CREAM (KENALOG ) 0.1 %    SMARTSIG:1 Application Topical 2-3 Times Daily   VITAMIN D , ERGOCALCIFEROL , (DRISDOL) 1.25 MG (50000 UNIT) CAPS CAPSULE    Take 50,000 Units by mouth every 7 (seven) days.  Modified Medications   No medications on file  Discontinued Medications   No medications on file  Allergies: Allergies[1]  Past Medical History: 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   Acute right-sided low back pain with right-sided sciatica 08/24/2017   Adrenal insufficiency    Anemia of chronic disease 03/11/2012   ASCUS with positive high risk HPV cervical 09/14/2017   Avascular necrosis of bone of left hip (HCC) 12/14/2015   Avascular necrosis of bone of right hip (HCC) 04/04/2016   Back pain of lumbar region with sciatica 02/12/2015   Bell's palsy 08/26/2013   Brain lesion    Brain tumor (HCC) 05/16/2020   Bullae 05/30/2012   Cerebral edema (HCC) 10/28/2019   Cervical dysplasia, mild 12/06/2020   Chronic back pain    Chronic kidney disease    Chronic leg  pain    bilateral knees, ankles   Chronic pelvic pain in female 01/04/2019   Complex regional pain syndrome 02/03/2017   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HCC)    Depression    Eczema 02/28/2022   Encephalitis, myelitis, 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   Fatigue    GERD (gastroesophageal reflux disease)    Headache    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   HIV (human immunodeficiency virus infection) (HCC) 02/2012   Hypertension    Laceration of ankle, right 11/18/2012   Lower abdominal pain 06/21/2018   Lumbar radiculopathy    Meningitis 02/18/2018   Pain of upper abdomen    Pelvic pain    PID (acute pelvic inflammatory disease) 02/26/2018   Pneumonia    Reflux esophagitis 03/11/2012   Rotator cuff strain 01/26/2020   S/P craniotomy 05/16/2020   Seizure (HCC)    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   Suicide ideation    Syphilis 02/26/2018   Tendinopathy of left shoulder 01/18/2019   TOA (tubo-ovarian abscess) 04/15/2021   Tuberculosis    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     Drinks about 1 soda a day    Social Drivers of  Health   Tobacco Use: Low Risk (10/03/2024)   Patient History    Smoking Tobacco Use: Never    Smokeless Tobacco Use: Never    Passive Exposure: Not on file  Financial Resource Strain: Not on file  Food Insecurity: Food Insecurity Present (01/13/2024)   Hunger Vital Sign    Worried About Running Out of Food in the Last Year: Sometimes true    Ran Out of Food in the Last Year: Sometimes true  Transportation Needs: No Transportation Needs (01/13/2024)   PRAPARE - Transportation    Lack  of Transportation (Medical): No    Lack of Transportation (Non-Medical): No  Physical Activity: Not on file  Stress: Not on file  Social Connections: Unknown (04/13/2022)   Received from [facility]   Social Network    Social Network: Not on file  Depression (PHQ2-9): Low Risk (03/11/2024)   Depression (PHQ2-9)    PHQ-2 Score: 0  Alcohol Screen: Not on file  Housing: Not on file  Utilities: Not on file  Health Literacy: Not on file    Labs: Lab Results  Component Value Date   HIV1RNAQUANT NOT DETECTED 10/03/2024   HIV1RNAQUANT 21 (H) 08/09/2024   HIV1RNAQUANT 31 (H) 05/04/2024   CD4TABS 405 10/03/2024   CD4TABS 344 (L) 08/09/2024   CD4TABS <35 (L) 09/28/2023    RPR and STI Lab Results  Component Value Date   LABRPR REACTIVE (A) 03/11/2024   LABRPR REACTIVE (A) 09/28/2023   LABRPR REACTIVE (A) 11/27/2022   LABRPR REACTIVE (A) 08/12/2022   LABRPR Reactive (A) 04/12/2022   RPRTITER 1:1 (H) 03/11/2024   RPRTITER 1:1 (H) 09/28/2023   RPRTITER 1:1 (H) 11/27/2022   RPRTITER 1:1 (H) 08/12/2022   RPRTITER 1:1 (H) 02/28/2022    STI Results GC CT  09/28/2023 10:51 AM Negative  Negative   03/18/2022 11:17 AM Negative  Negative   01/02/2022  3:50 PM Negative  Negative   04/14/2021 10:35 PM Negative  Negative   11/01/2020  3:18 PM Negative  Negative   08/09/2018 12:00 AM Negative  Negative   02/18/2018 12:00 AM Negative  Negative   02/12/2018 12:00 AM Negative  **POSITIVE**    11/20/2017 12:00 AM Negative  Negative   05/23/2016 12:00 AM Negative  Negative   01/23/2015 12:00 AM NG: Negative  CT: Negative     Hepatitis B Lab Results  Component Value Date   HEPBSAB POS (A) 09/13/2015   HEPBSAG NEGATIVE 03/13/2012   HEPBCAB NEGATIVE 03/13/2012   Hepatitis C Lab Results  Component Value Date   HEPCAB NON-REACTIVE 03/17/2022   Hepatitis A Lab Results  Component Value Date   HAV POSITIVE (A) 03/13/2012   Lipids: Lab Results  Component Value Date   CHOL 228 (H) 03/11/2024   TRIG 112 03/11/2024   HDL 72 03/11/2024   CHOLHDL 3.2 03/11/2024   VLDL 29 12/25/2016   LDLCALC 134 (H) 03/11/2024    TARGET DATE:  The 26th of the month  Assessment: Belth presents today for their maintenance Cabenuva  injections. Initial/past injections were tolerated well without issues. No problems with systemic effects of injections. Patient has maintained undetectable viral load since March 2025, so based on discussion with Dr. Luiz, will transition patient to every two month dosing today.   Administered cabotegravir  600mg /62mL in left upper outer quadrant of the gluteal muscle. Administered rilpivirine  900 mg/3mL in the right upper outer quadrant of the gluteal muscle. Monitored patient for 10 minutes after injection. Injections were tolerated well without issue. Patient will follow up in 2 months for next injection.  Remains eligible for flu and COVID vaccines; ***.   Plan: - Administer Cabenuva  injections  - Next injections scheduled for *** with Dr. Luiz and *** with ***  - Call with any issues or questions  Alan Geralds, PharmD, CPP, BCIDP, AAHIVP Clinical Pharmacist Practitioner Infectious Diseases Clinical Pharmacist [facility]      [1]  Allergies Allergen Reactions   Hydrocodone  Itching and Nausea Only    Pt states she can tolerate with benadryl  and Tolerates Oxycodone   Tramadol  Itching and Nausea Only    Pt states she can  tolerate with benadryl  and Tolerates oxycodone 

## 2024-12-01 ENCOUNTER — Other Ambulatory Visit (HOSPITAL_COMMUNITY): Payer: Self-pay

## 2024-12-02 ENCOUNTER — Encounter: Payer: Self-pay | Admitting: Pharmacist

## 2024-12-02 ENCOUNTER — Ambulatory Visit: Admitting: Pharmacist

## 2024-12-09 ENCOUNTER — Other Ambulatory Visit: Payer: Self-pay | Admitting: Internal Medicine

## 2024-12-12 NOTE — Progress Notes (Unsigned)
 "  HPI: Kelsey Rodriguez is a 33 y.o. female who presents to the [facility] pharmacy clinic for Cabenuva  administration.  Referring ID Physician: Dr. Luiz  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 05/09/2020   Hypertension    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HCC)    Seizure (HCC)    Toxoplasmosis 11/07/2019   AIDS (acquired immune deficiency syndrome) (HCC) 11/07/2019   Chronic pelvic pain in female 01/04/2019   Tuberculosis    Complex regional pain syndrome 02/03/2017   Headache 10/28/2016   Primary adrenal insufficiency (HCC) 01/03/2015   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   AMITRIPTYLINE  (ELAVIL ) 25 MG TABLET    Take 1 tablet (25 mg total) by mouth at bedtime.   CABOTEGRAVIR  & RILPIVIRINE  ER (CABENUVA ) 600 & 900 MG/3ML INJECTION    Inject 1 kit into the muscle every 2 (two) months.   CLOTRIMAZOLE -BETAMETHASONE (LOTRISONE) CREAM    Apply topically 2 (two) times daily as needed.   COLCHICINE  0.6 MG TABLET    Take 1 tablet (0.6 mg total) by mouth daily.   DIPHENHYDRAMINE  (BENADRYL ) 25 MG TABLET    Take 25 mg by mouth every 6 (six) hours as needed (when taking oxycodone ).   DOCUSATE SODIUM  (COLACE) 100 MG CAPSULE    Take 1 capsule (100 mg total) by mouth daily as needed for up to 30 doses.   ENSURE (ENSURE)    Take 1 Can by mouth 2 (two) times daily between meals.   HYDROCODONE -ACETAMINOPHEN  (NORCO/VICODIN) 5-325 MG  TABLET    Take 1 tablet by mouth every 6 (six) hours as needed for pain.   HYDROXYPROPYL METHYLCELLULOSE / HYPROMELLOSE (ISOPTO TEARS / GONIOVISC) 2.5 % OPHTHALMIC SOLUTION    Place 1 drop into both eyes as needed for dry eyes.   IBUPROFEN  (ADVIL ) 800 MG TABLET    Take 800 mg by mouth 2 (two) times daily as needed for moderate pain.   IUD'S IU    by Intrauterine route.   MELOXICAM  (MOBIC ) 15 MG TABLET    Take 1 tablet daily with food for 7 days. Then take as needed.   NALOXONE  (NARCAN ) NASAL SPRAY 4 MG/0.1 ML    Instill 1 spray every 2 minutes for opioid overdose;spray 1 dose into ONE nostril; alternate nostrils w each dose until help arrives   POLYETHYLENE GLYCOL 3350  (MIRALAX  PO)    Take 17 g by mouth daily.   TRANEXAMIC ACID  (LYSTEDA ) 650 MG TABS TABLET    Take 2 tablets (1,300 mg total) by mouth 3 (three) times daily. Take during menses for a maximum of five days   TRIAMCINOLONE  CREAM (KENALOG ) 0.1 %    SMARTSIG:1 Application Topical 2-3 Times Daily   VITAMIN D , ERGOCALCIFEROL , (DRISDOL) 1.25 MG (50000 UNIT) CAPS CAPSULE    Take 50,000 Units by mouth every 7 (seven) days.  Modified Medications   No medications on file  Discontinued Medications   No medications on file  Allergies: Allergies[1]  Past Medical History: 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   Acute right-sided low back pain with right-sided sciatica 08/24/2017   Adrenal insufficiency    Anemia of chronic disease 03/11/2012   ASCUS with positive high risk HPV cervical 09/14/2017   Avascular necrosis of bone of left hip (HCC) 12/14/2015   Avascular necrosis of bone of right hip (HCC) 04/04/2016   Back pain of lumbar region with sciatica 02/12/2015   Bell's palsy 08/26/2013   Brain lesion    Brain tumor (HCC) 05/16/2020   Bullae 05/30/2012   Cerebral edema (HCC) 10/28/2019   Cervical dysplasia, mild 12/06/2020   Chronic back pain    Chronic kidney disease    Chronic leg  pain    bilateral knees, ankles   Chronic pelvic pain in female 01/04/2019   Complex regional pain syndrome 02/03/2017   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HCC)    Depression    Eczema 02/28/2022   Encephalitis, myelitis, 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   Fatigue    GERD (gastroesophageal reflux disease)    Headache    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   HIV (human immunodeficiency virus infection) (HCC) 02/2012   Hypertension    Laceration of ankle, right 11/18/2012   Lower abdominal pain 06/21/2018   Lumbar radiculopathy    Meningitis 02/18/2018   Pain of upper abdomen    Pelvic pain    PID (acute pelvic inflammatory disease) 02/26/2018   Pneumonia    Reflux esophagitis 03/11/2012   Rotator cuff strain 01/26/2020   S/P craniotomy 05/16/2020   Seizure (HCC)    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   Suicide ideation    Syphilis 02/26/2018   Tendinopathy of left shoulder 01/18/2019   TOA (tubo-ovarian abscess) 04/15/2021   Tuberculosis    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     Drinks about 1 soda a day    Social Drivers of  Health   Tobacco Use: Low Risk (10/03/2024)   Patient History    Smoking Tobacco Use: Never    Smokeless Tobacco Use: Never    Passive Exposure: Not on file  Financial Resource Strain: Not on file  Food Insecurity: Food Insecurity Present (01/13/2024)   Hunger Vital Sign    Worried About Running Out of Food in the Last Year: Sometimes true    Ran Out of Food in the Last Year: Sometimes true  Transportation Needs: No Transportation Needs (01/13/2024)   PRAPARE - Transportation    Lack  of Transportation (Medical): No    Lack of Transportation (Non-Medical): No  Physical Activity: Not on file  Stress: Not on file  Social Connections: Unknown (04/13/2022)   Received from [facility]   Social Network    Social Network: Not on file  Depression (PHQ2-9): Low Risk (03/11/2024)   Depression (PHQ2-9)    PHQ-2 Score: 0  Alcohol Screen: Not on file  Housing: Not on file  Utilities: Not on file  Health Literacy: Not on file    Labs: Lab Results  Component Value Date   HIV1RNAQUANT NOT DETECTED 10/03/2024   HIV1RNAQUANT 21 (H) 08/09/2024   HIV1RNAQUANT 31 (H) 05/04/2024   CD4TABS 405 10/03/2024   CD4TABS 344 (L) 08/09/2024   CD4TABS <35 (L) 09/28/2023    RPR and STI Lab Results  Component Value Date   LABRPR REACTIVE (A) 03/11/2024   LABRPR REACTIVE (A) 09/28/2023   LABRPR REACTIVE (A) 11/27/2022   LABRPR REACTIVE (A) 08/12/2022   LABRPR Reactive (A) 04/12/2022   RPRTITER 1:1 (H) 03/11/2024   RPRTITER 1:1 (H) 09/28/2023   RPRTITER 1:1 (H) 11/27/2022   RPRTITER 1:1 (H) 08/12/2022   RPRTITER 1:1 (H) 02/28/2022    STI Results GC CT  09/28/2023 10:51 AM Negative  Negative   03/18/2022 11:17 AM Negative  Negative   01/02/2022  3:50 PM Negative  Negative   04/14/2021 10:35 PM Negative  Negative   11/01/2020  3:18 PM Negative  Negative   08/09/2018 12:00 AM Negative  Negative   02/18/2018 12:00 AM Negative  Negative   02/12/2018 12:00 AM Negative  **POSITIVE**    11/20/2017 12:00 AM Negative  Negative   05/23/2016 12:00 AM Negative  Negative   01/23/2015 12:00 AM NG: Negative  CT: Negative     Hepatitis B Lab Results  Component Value Date   HEPBSAB POS (A) 09/13/2015   HEPBSAG NEGATIVE 03/13/2012   HEPBCAB NEGATIVE 03/13/2012   Hepatitis C Lab Results  Component Value Date   HEPCAB NON-REACTIVE 03/17/2022   Hepatitis A Lab Results  Component Value Date   HAV POSITIVE (A) 03/13/2012   Lipids: Lab Results  Component Value Date   CHOL 228 (H) 03/11/2024   TRIG 112 03/11/2024   HDL 72 03/11/2024   CHOLHDL 3.2 03/11/2024   VLDL 29 12/25/2016   LDLCALC 134 (H) 03/11/2024    TARGET DATE:  The 26th of the month  Assessment: Mairlyn presents today for their maintenance Cabenuva  injections. Initial/past injections were tolerated well without issues. No problems with systemic effects of injections. Has been undetectable on monthly Cabenuva  injections since March, so team plans to transition patient to bimonthly injections today.    Administered cabotegravir  600mg /36mL in right upper outer quadrant of the gluteal muscle. Administered rilpivirine  900 mg/3mL in the left upper outer quadrant of the gluteal muscle. Monitored patient for 10 minutes after injection. Injections were tolerated well without issue. Patient will follow up in 2 months for next injection.  Eligible for flu and COVID vaccines which she declines. States she already received the flu vaccine this year and politely declines further COVID vaccines.   Plan: - Administer Cabenuva  injections  - Next injections scheduled for 02/06/25 with Dr. Luiz and 04/04/25 with me  - Call with any issues or questions  Alan Geralds, PharmD, CPP, BCIDP, AAHIVP Clinical Pharmacist Practitioner Infectious Diseases Clinical Pharmacist [facility]     [1]  Allergies Allergen Reactions   Hydrocodone  Itching and Nausea Only    Pt states  she can tolerate with  benadryl  and Tolerates Oxycodone    Tramadol  Itching and Nausea Only    Pt states she can tolerate with benadryl  and Tolerates oxycodone    "

## 2024-12-14 ENCOUNTER — Other Ambulatory Visit: Payer: Self-pay

## 2024-12-14 ENCOUNTER — Ambulatory Visit: Payer: Self-pay | Admitting: Pharmacist

## 2024-12-14 DIAGNOSIS — B20 Human immunodeficiency virus [HIV] disease: Secondary | ICD-10-CM

## 2024-12-14 MED ORDER — CABOTEGRAVIR & RILPIVIRINE ER 600 & 900 MG/3ML IM SUER
1.0000 | Freq: Once | INTRAMUSCULAR | Status: AC
  Administered 2024-12-14: 1 via INTRAMUSCULAR

## 2025-01-03 ENCOUNTER — Ambulatory Visit: Admitting: Pharmacist

## 2025-01-16 ENCOUNTER — Other Ambulatory Visit: Payer: Self-pay

## 2025-02-06 ENCOUNTER — Encounter: Payer: Self-pay | Admitting: Internal Medicine

## 2025-02-09 ENCOUNTER — Ambulatory Visit: Admitting: Neurology

## 2025-04-04 ENCOUNTER — Ambulatory Visit: Payer: Self-pay | Admitting: Pharmacist
